# Patient Record
Sex: Male | Born: 1935
Health system: Southern US, Community
[De-identification: ages and names within clinical notes are randomized; demographics above are authoritative.]

## PROBLEM LIST (undated history)

## (undated) DIAGNOSIS — R519 Headache, unspecified: Secondary | ICD-10-CM

## (undated) DIAGNOSIS — I2510 Atherosclerotic heart disease of native coronary artery without angina pectoris: Secondary | ICD-10-CM

## (undated) DIAGNOSIS — I4891 Unspecified atrial fibrillation: Principal | ICD-10-CM

## (undated) DIAGNOSIS — M1990 Unspecified osteoarthritis, unspecified site: Secondary | ICD-10-CM

## (undated) DIAGNOSIS — N400 Enlarged prostate without lower urinary tract symptoms: Secondary | ICD-10-CM

## (undated) DIAGNOSIS — R7303 Prediabetes: Secondary | ICD-10-CM

## (undated) DIAGNOSIS — I5021 Acute systolic (congestive) heart failure: Secondary | ICD-10-CM

## (undated) DIAGNOSIS — I249 Acute ischemic heart disease, unspecified: Secondary | ICD-10-CM

## (undated) DIAGNOSIS — H409 Unspecified glaucoma: Secondary | ICD-10-CM

## (undated) DIAGNOSIS — E669 Obesity, unspecified: Secondary | ICD-10-CM

## (undated) DIAGNOSIS — K219 Gastro-esophageal reflux disease without esophagitis: Secondary | ICD-10-CM

## (undated) DIAGNOSIS — Z951 Presence of aortocoronary bypass graft: Secondary | ICD-10-CM

## (undated) DIAGNOSIS — I480 Paroxysmal atrial fibrillation: Secondary | ICD-10-CM

## (undated) DIAGNOSIS — Z9889 Other specified postprocedural states: Secondary | ICD-10-CM

## (undated) DIAGNOSIS — Z87442 Personal history of urinary calculi: Secondary | ICD-10-CM

## (undated) DIAGNOSIS — I219 Acute myocardial infarction, unspecified: Secondary | ICD-10-CM

## (undated) DIAGNOSIS — N200 Calculus of kidney: Secondary | ICD-10-CM

## (undated) DIAGNOSIS — E785 Hyperlipidemia, unspecified: Secondary | ICD-10-CM

## (undated) DIAGNOSIS — I10 Essential (primary) hypertension: Secondary | ICD-10-CM

## (undated) DIAGNOSIS — I509 Heart failure, unspecified: Secondary | ICD-10-CM

## (undated) DIAGNOSIS — I209 Angina pectoris, unspecified: Secondary | ICD-10-CM

## (undated) DIAGNOSIS — M353 Polymyalgia rheumatica: Secondary | ICD-10-CM

## (undated) DIAGNOSIS — I714 Abdominal aortic aneurysm, without rupture, unspecified: Secondary | ICD-10-CM

## (undated) DIAGNOSIS — R0600 Dyspnea, unspecified: Secondary | ICD-10-CM

## (undated) DIAGNOSIS — I872 Venous insufficiency (chronic) (peripheral): Secondary | ICD-10-CM

## (undated) DIAGNOSIS — G4730 Sleep apnea, unspecified: Secondary | ICD-10-CM

## (undated) DIAGNOSIS — I77810 Thoracic aortic ectasia: Secondary | ICD-10-CM

## (undated) DIAGNOSIS — Z8679 Personal history of other diseases of the circulatory system: Secondary | ICD-10-CM

## (undated) HISTORY — DX: Venous insufficiency (chronic) (peripheral): I87.2

## (undated) HISTORY — DX: Hyperlipidemia, unspecified: E78.5

## (undated) HISTORY — DX: Acute myocardial infarction, unspecified: I21.9

## (undated) HISTORY — DX: Abdominal aortic aneurysm, without rupture: I71.4

## (undated) HISTORY — DX: Obesity, unspecified: E66.9

## (undated) HISTORY — DX: Benign prostatic hyperplasia without lower urinary tract symptoms: N40.0

## (undated) HISTORY — PX: CARDIAC CATHETERIZATION: SHX172

## (undated) HISTORY — DX: Polymyalgia rheumatica: M35.3

## (undated) HISTORY — DX: Essential (primary) hypertension: I10

## (undated) HISTORY — DX: Angina pectoris, unspecified: I20.9

## (undated) HISTORY — PX: CATARACT EXTRACTION, BILATERAL: SHX1313

## (undated) HISTORY — DX: Heart failure, unspecified: I50.9

## (undated) HISTORY — DX: Dyspnea, unspecified: R06.00

## (undated) HISTORY — DX: Paroxysmal atrial fibrillation: I48.0

## (undated) HISTORY — DX: Atherosclerotic heart disease of native coronary artery without angina pectoris: I25.10

## (undated) HISTORY — PX: PROSTATECTOMY: SHX69

## (undated) HISTORY — DX: Abdominal aortic aneurysm, without rupture, unspecified: I71.40

---

## 2001-10-24 ENCOUNTER — Inpatient Hospital Stay (HOSPITAL_COMMUNITY): Admission: AD | Admit: 2001-10-24 | Discharge: 2001-10-28 | Payer: Self-pay | Admitting: Internal Medicine

## 2001-10-28 ENCOUNTER — Encounter (INDEPENDENT_AMBULATORY_CARE_PROVIDER_SITE_OTHER): Payer: Self-pay | Admitting: Specialist

## 2002-10-29 ENCOUNTER — Ambulatory Visit (HOSPITAL_COMMUNITY): Admission: RE | Admit: 2002-10-29 | Discharge: 2002-10-29 | Payer: Self-pay | Admitting: Cardiology

## 2004-11-06 ENCOUNTER — Ambulatory Visit: Payer: Self-pay | Admitting: Gastroenterology

## 2004-11-20 ENCOUNTER — Ambulatory Visit: Payer: Self-pay | Admitting: Gastroenterology

## 2008-12-14 DIAGNOSIS — E539 Vitamin B deficiency, unspecified: Secondary | ICD-10-CM | POA: Insufficient documentation

## 2008-12-14 DIAGNOSIS — N529 Male erectile dysfunction, unspecified: Secondary | ICD-10-CM | POA: Insufficient documentation

## 2008-12-14 DIAGNOSIS — R7301 Impaired fasting glucose: Secondary | ICD-10-CM | POA: Insufficient documentation

## 2009-01-26 ENCOUNTER — Encounter: Admission: RE | Admit: 2009-01-26 | Discharge: 2009-01-26 | Payer: Self-pay | Admitting: Cardiology

## 2009-01-27 ENCOUNTER — Inpatient Hospital Stay (HOSPITAL_BASED_OUTPATIENT_CLINIC_OR_DEPARTMENT_OTHER): Admission: RE | Admit: 2009-01-27 | Discharge: 2009-01-27 | Payer: Self-pay | Admitting: Cardiology

## 2009-07-02 ENCOUNTER — Emergency Department (HOSPITAL_COMMUNITY): Admission: EM | Admit: 2009-07-02 | Discharge: 2009-07-02 | Payer: Self-pay | Admitting: Emergency Medicine

## 2009-07-05 DIAGNOSIS — N200 Calculus of kidney: Secondary | ICD-10-CM | POA: Insufficient documentation

## 2009-07-05 DIAGNOSIS — K5900 Constipation, unspecified: Secondary | ICD-10-CM | POA: Insufficient documentation

## 2010-03-19 LAB — HEPATIC FUNCTION PANEL
Alkaline Phosphatase: 36 U/L — ABNORMAL LOW (ref 39–117)
Total Bilirubin: 0.8 mg/dL (ref 0.3–1.2)
Total Protein: 6.4 g/dL (ref 6.0–8.3)

## 2010-03-19 LAB — POCT I-STAT, CHEM 8
Calcium, Ion: 1.08 mmol/L — ABNORMAL LOW (ref 1.12–1.32)
HCT: 40 % (ref 39.0–52.0)
Hemoglobin: 13.6 g/dL (ref 13.0–17.0)
TCO2: 21 mmol/L (ref 0–100)

## 2010-03-19 LAB — URINALYSIS, ROUTINE W REFLEX MICROSCOPIC
Bilirubin Urine: NEGATIVE
pH: 6 (ref 5.0–8.0)

## 2010-03-19 LAB — URINE MICROSCOPIC-ADD ON

## 2010-04-12 ENCOUNTER — Inpatient Hospital Stay (HOSPITAL_COMMUNITY)
Admission: EM | Admit: 2010-04-12 | Discharge: 2010-04-14 | DRG: 287 | Disposition: A | Discharge status: Home / Self Care | Payer: Medicare Other | Attending: Cardiology | Admitting: Cardiology

## 2010-04-12 ENCOUNTER — Emergency Department (HOSPITAL_COMMUNITY): Payer: Medicare Other

## 2010-04-12 DIAGNOSIS — E785 Hyperlipidemia, unspecified: Secondary | ICD-10-CM | POA: Diagnosis present

## 2010-04-12 DIAGNOSIS — I209 Angina pectoris, unspecified: Secondary | ICD-10-CM | POA: Diagnosis present

## 2010-04-12 DIAGNOSIS — Z7902 Long term (current) use of antithrombotics/antiplatelets: Secondary | ICD-10-CM

## 2010-04-12 DIAGNOSIS — M109 Gout, unspecified: Secondary | ICD-10-CM | POA: Diagnosis present

## 2010-04-12 DIAGNOSIS — Z87891 Personal history of nicotine dependence: Secondary | ICD-10-CM

## 2010-04-12 DIAGNOSIS — N138 Other obstructive and reflux uropathy: Secondary | ICD-10-CM | POA: Diagnosis present

## 2010-04-12 DIAGNOSIS — I10 Essential (primary) hypertension: Secondary | ICD-10-CM | POA: Diagnosis present

## 2010-04-12 DIAGNOSIS — I498 Other specified cardiac arrhythmias: Secondary | ICD-10-CM | POA: Diagnosis present

## 2010-04-12 DIAGNOSIS — E669 Obesity, unspecified: Secondary | ICD-10-CM | POA: Diagnosis present

## 2010-04-12 DIAGNOSIS — I2510 Atherosclerotic heart disease of native coronary artery without angina pectoris: Principal | ICD-10-CM | POA: Diagnosis present

## 2010-04-12 DIAGNOSIS — N401 Enlarged prostate with lower urinary tract symptoms: Secondary | ICD-10-CM | POA: Diagnosis present

## 2010-04-12 DIAGNOSIS — F068 Other specified mental disorders due to known physiological condition: Secondary | ICD-10-CM | POA: Diagnosis present

## 2010-04-12 LAB — URINALYSIS, ROUTINE W REFLEX MICROSCOPIC
Bilirubin Urine: NEGATIVE
Glucose, UA: NEGATIVE mg/dL
Hgb urine dipstick: NEGATIVE
Ketones, ur: NEGATIVE mg/dL
Nitrite: NEGATIVE
Protein, ur: NEGATIVE mg/dL
Specific Gravity, Urine: 1.016 (ref 1.005–1.030)
Urobilinogen, UA: 0.2 mg/dL (ref 0.0–1.0)
pH: 6.5 (ref 5.0–8.0)

## 2010-04-12 LAB — COMPREHENSIVE METABOLIC PANEL
Albumin: 3.5 g/dL (ref 3.5–5.2)
BUN: 16 mg/dL (ref 6–23)
Creatinine, Ser: 1.03 mg/dL (ref 0.4–1.5)
GFR calc Af Amer: >60 mL/min (ref >60)
Potassium: 3.9 mEq/L (ref 3.5–5.1)
Sodium: 134 mEq/L — ABNORMAL LOW (ref 135–145)
Total Bilirubin: 1.1 mg/dL (ref 0.3–1.2)
Total Protein: 6 g/dL (ref 6.0–8.3)

## 2010-04-12 LAB — CBC
HCT: 38.2 % — ABNORMAL LOW (ref 39.0–52.0)
Hemoglobin: 12.6 g/dL — ABNORMAL LOW (ref 13.0–17.0)
MCH: 30.3 pg (ref 26.0–34.0)
MCHC: 33 g/dL (ref 30.0–36.0)
MCV: 91.8 fL (ref 78.0–100.0)
Platelets: 138 10*3/uL — ABNORMAL LOW (ref 150–400)
RBC: 4.16 MIL/uL — ABNORMAL LOW (ref 4.22–5.81)
RDW: 14 % (ref 11.5–15.5)
WBC: 6.1 10*3/uL (ref 4.0–10.5)

## 2010-04-12 LAB — BASIC METABOLIC PANEL
BUN: 18 mg/dL (ref 6–23)
Chloride: 103 mEq/L (ref 96–112)
GFR calc Af Amer: >60 mL/min (ref >60)
GFR calc non Af Amer: >60 mL/min (ref >60)
Glucose, Bld: 106 mg/dL — ABNORMAL HIGH (ref 70–99)
Potassium: 4.2 mEq/L (ref 3.5–5.1)

## 2010-04-12 LAB — DIFFERENTIAL
Basophils Absolute: 0.1 K/uL (ref 0.0–0.1)
Basophils Relative: 1 % (ref 0–1)
Eosinophils Absolute: 0.4 K/uL (ref 0.0–0.7)
Eosinophils Relative: 7 % — ABNORMAL HIGH (ref 0–5)
Lymphocytes Relative: 18 % (ref 12–46)
Lymphs Abs: 1.1 K/uL (ref 0.7–4.0)
Monocytes Absolute: 0.6 10*3/uL (ref 0.1–1.0)
Monocytes Relative: 10 % (ref 3–12)
Neutro Abs: 3.9 10*3/uL (ref 1.7–7.7)
Neutrophils Relative %: 64 % (ref 43–77)

## 2010-04-12 LAB — BASIC METABOLIC PANEL WITH GFR
CO2: 24 meq/L (ref 19–32)
Calcium: 8.9 mg/dL (ref 8.4–10.5)
Creatinine, Ser: 1.11 mg/dL (ref 0.4–1.5)
Sodium: 135 meq/L (ref 135–145)

## 2010-04-12 LAB — PROTIME-INR
INR: 0.99 (ref 0.00–1.49)
Prothrombin Time: 13.3 s (ref 11.6–15.2)

## 2010-04-12 LAB — APTT: aPTT: 26 seconds (ref 24–37)

## 2010-04-12 LAB — CK TOTAL AND CKMB (NOT AT ARMC)
Relative Index: INVALID (ref 0.0–2.5)
Total CK: 89 U/L (ref 7–232)

## 2010-04-12 LAB — CARDIAC PANEL(CRET KIN+CKTOT+MB+TROPI)
CK, MB: 2.1 ng/mL (ref 0.3–4.0)
Troponin I: <0.01 ng/mL (ref 0.00–0.06)

## 2010-04-13 LAB — CBC
HCT: 33.8 % — ABNORMAL LOW (ref 39.0–52.0)
MCH: 30.5 pg (ref 26.0–34.0)
MCHC: 33.4 g/dL (ref 30.0–36.0)
MCV: 91.1 fL (ref 78.0–100.0)
RDW: 14.1 % (ref 11.5–15.5)

## 2010-04-13 LAB — CARDIAC PANEL(CRET KIN+CKTOT+MB+TROPI): CK, MB: 1.5 ng/mL (ref 0.3–4.0)

## 2010-04-13 LAB — HEPARIN LEVEL (UNFRACTIONATED): Heparin Unfractionated: 0.3 IU/mL (ref 0.30–0.70)

## 2010-04-14 LAB — CBC
HCT: 36.3 % — ABNORMAL LOW (ref 39.0–52.0)
MCV: 90.5 fL (ref 78.0–100.0)
Platelets: 123 10*3/uL — ABNORMAL LOW (ref 150–400)
RBC: 4.01 MIL/uL — ABNORMAL LOW (ref 4.22–5.81)
WBC: 5.4 10*3/uL (ref 4.0–10.5)

## 2010-04-14 LAB — BASIC METABOLIC PANEL
BUN: 9 mg/dL (ref 6–23)
Chloride: 105 mEq/L (ref 96–112)
Potassium: 3.5 mEq/L (ref 3.5–5.1)
Sodium: 134 mEq/L — ABNORMAL LOW (ref 135–145)

## 2010-04-16 ENCOUNTER — Emergency Department (HOSPITAL_COMMUNITY)
Admission: EM | Admit: 2010-04-16 | Discharge: 2010-04-16 | Disposition: A | Discharge status: Home / Self Care | Payer: Medicare Other | Attending: Emergency Medicine | Admitting: Emergency Medicine

## 2010-04-16 DIAGNOSIS — E7800 Pure hypercholesterolemia, unspecified: Secondary | ICD-10-CM | POA: Insufficient documentation

## 2010-04-16 DIAGNOSIS — R339 Retention of urine, unspecified: Secondary | ICD-10-CM | POA: Insufficient documentation

## 2010-04-16 DIAGNOSIS — N400 Enlarged prostate without lower urinary tract symptoms: Secondary | ICD-10-CM | POA: Insufficient documentation

## 2010-04-16 LAB — URINALYSIS, ROUTINE W REFLEX MICROSCOPIC
Glucose, UA: NEGATIVE mg/dL
Ketones, ur: NEGATIVE mg/dL
Specific Gravity, Urine: 1.023 (ref 1.005–1.030)
pH: 6 (ref 5.0–8.0)

## 2010-04-17 LAB — URINE CULTURE
Colony Count: NO GROWTH
Culture  Setup Time: 201204151158

## 2010-04-18 DIAGNOSIS — R339 Retention of urine, unspecified: Secondary | ICD-10-CM | POA: Insufficient documentation

## 2010-04-19 NOTE — H&P (Signed)
NAME:  [name] [name], [name] [name] NO.:  [id number]  MEDICAL RECORD NO.:  [id number]           PATIENT TYPE:  E  LOCATION:  [facility]                         FACILITY:  [facility]  PHYSICIAN:  Ezzard Standing, M.D.DATE OF BIRTH:  [birth year]  DATE OF ADMISSION:  04/12/2010 DATE OF DISCHARGE:                             HISTORY & PHYSICAL   REASON FOR ADMISSION:  Chest discomfort.  HISTORY:  The patient is an 77 year old male who has a history of coronary artery disease with angina.  He has a history of hypertension and hyperlipidemia.  He had an occluded right coronary artery with moderately severe disease involving the LAD and left main coronary artery catheterization 1 year ago.  He has been treated medically since that time.  He has typical angina pectoris that has precipitated by cold air or with moderate exertion.  He finds if he eats a heavy meal and then exercises, he will typically get an angina.  He has not had rest symptoms previously.  He oftentimes was having angina precipitated by sexual activity.  He had a fairly heavy work out in the yard yesterday and had some vague angina during this period.  He had eaten a meal and had angina on walking to a lecture last evening.  He had the onset of midsternal chest discomfort described as heaviness and tightness resembled in his previous angina that occurred last evening and he took a nitroglycerin. The discomfort occurred on and off throughout the evening and he was getting ready to go to the gymnasium this morning and because he was having  some vague chest discomfort was brought to the emergency room after recommendation by his primary physician's office.  He has been started on nitroglycerin and is currently pain-free.  He has a previous history of a headache for the past 5 days and had a negative CT scan. He denies PND, orthopnea, edema, syncope or claudication.  PAST MEDICAL HISTORY:  Remarkable for: 1.  Hypertension. 2. Hyperlipidemia. 3. Gout. 4. Obesity. 5. BPH.  PREVIOUS SURGERY:  None.  ALLERGIES:  BEESTINGS.  CURRENT MEDICATIONS: 1. Allopurinol 300 mg daily. 2. Finasteride 5 mg daily. 3. Aspirin 81 mg daily. 4. Atenolol 50 mg daily. 5. Benazepril 10 mg daily. 6. Colchicine 0.6 mg p.r.n. 7. Crestor 20 mg daily. 8. Nitroglycerin p.r.n. 9. Vitamin B12 injections and capsules. 10.Fish oil. 11.Vitamin D. 12.Zetia 10 mg daily. 13.Amlodipine 5 mg daily.  SOCIAL HISTORY:  He is retired from [employer].  He is a native of [place].  He used to smoke but quit prior to 1980.  Drinks alcohol socially.  He is widowed and remarried.  He exercises on a regular basis.  FAMILY HISTORY:  Recorded in old records is reviewed and unchanged.  REVIEW OF SYSTEMS:  He has been obese for several years.  His weight has been stable.  He has no skin problems.  He has had bilateral cataract extraction.  He has intermittent sinusitis and upper respiratory allergies.  He denies dyspnea, cough, wheezing or hemoptysis.  He has no palpitations.  He has a history of hesitancy erectile dysfunction and  mild urinary incontinence.  He denies arthritis, but does have some mild edema and venous insufficiency.  He has evidently been diagnosed with mild dementia recently by his primary physician and he has been placed on some unknown medication.  Other than as noted above, the remainder of review of systems is unremarkable.  PHYSICAL EXAMINATION:  GENERAL:  He is a pleasant obese male who is currently in no acute distress. VITAL SIGNS:  His blood pressure is 130/70, pulse is currently 45 and regular. SKIN:  Warm and dry. ENT:  EOMI.  PERRLA.  C and S:  Clear.  Balding male hair pattern. NECK:  Supple.  No masses, no JVD, thyromegaly or bruits. LUNGS:  Clear to A and P.  Normal symmetry. CARDIAC:  Regular slow rhythm, normal S1, S2, there is one 2/6 systolic murmur at the aortic area.  There is no  diastolic murmur. ABDOMEN:  Soft, nontender without masses, hepatosplenomegaly or aneurysm.  Femoral pulses are 2+, no bruits noted.  Distal pulses are 2+.  He has 1+ edema with mild venous insufficiency changes noted. NEUROLOGIC:  Alert, oriented x3.  Sensory and motor are intact.  LABS AND IMAGING:  EKG shows sinus bradycardia with a normal EKG, otherwise.  His glucose is slightly elevated but BUN and creatinine were normal.  Chemistry panel was normal.  Her initial point-of-care enzymes was normal.  PT and PTT were normal.  Creatinine is 1.11.  CT scan of his head is normal.  Chest x-ray shows borderline cardiomegaly.  IMPRESSION: 1. Unstable angina pectoris with prolonged pain occurring at rest. 2. Coronary artery disease with mild-to-moderate left main disease,     moderate left anterior descending (coronary) artery and circumflex     disease, and occluded right coronary artery with normal left     ventricular function. 3. Systolic heart murmur. 4. Hyperlipidemia under treatment. 5. Hypertension. 6. Obesity. 7. Early dementia.  RECOMMENDATIONS:  The patient will be placed on IV heparin, continued on IV nitroglycerin and continued on his medications.  PLAN:  Cardiac catheterization to assess for progression of disease.  He appears to have had significant angina and is on good medical therapy and may require a bypass surgery if significant progression is noted. Cardiac catheterization procedure was discussed with the patient fully including the risks of myocardial infarction, death, stroke or bleeding. He understands and is willing to proceed.     Ezzard Standing, M.D.     WST/MEDQ  D:  04/12/2010  T:  04/13/2010  Job:  [id number]  cc:   Elta Guadeloupe A. Perini, M.D.  Electronically Signed by Viona Gilmore. Wynonia Lawman M.D. on 04/19/2010 02:08:46 PM

## 2010-04-19 NOTE — Cardiovascular Report (Signed)
  NAME:  [name] [name], [name] [name] NO.:  [id number]  MEDICAL RECORD NO.:  [id number]           PATIENT TYPE:  LOCATION:                                 FACILITY:  PHYSICIAN:  Ezzard Standing, M.D.DATE OF BIRTH:  [birth year]  DATE OF PROCEDURE:  04/13/2010                            CARDIAC CATHETERIZATION   HISTORY:  This is an 77 year old male who has a prior history of coronary artery disease treated medically presented with prolonged chest discomfort with negative cardiac enzymes and EKG occurring at rest.  PROCEDURE:  Left heart catheterization with coronary angiograms and left ventriculogram.  COMMENTS ABOUT PROCEDURE:  The patient was brought to the catheterization laboratory and was prepped and draped in the usual manner.  After Xylocaine anesthesia, a 5-French sheath was placed in the right femoral artery after a single anterior needle wall stick. Angiograms were made using 5-French catheters.  A 5 left Judkins catheter was used to select the left coronary artery.  A 30 mL ventriculogram was performed.  He was returned to the holding area in stable condition for sheath removal.  HEMODYNAMIC DATA:  Aorta postcontrast 121/12, LV postcontrast 121/12-20, aorta 121/57.  ANGIOGRAPHIC DATA:  Left ventriculogram:  Performed in the 30-degree RAO projection.  The aortic valve is normal.  The mitral valve is normal. Left ventricle is normal in size with normal wall motion.  Estimated ejection fraction is 60%.  Coronary arteries arise and distribute normally.  There is significant coronary calcification present.  Left main coronary artery is calcified with a mild ostial stenosis.  There is a distal 40% stenosis in the left main coronary artery.  The left anterior descending is heavily calcified.  There is moderate segmental disease estimated at 40-50% along the proximal portion of the vessel. Diagonal branch has an ostial 60% stenosis.  Circumflex coronary artery first  marginal branch has a hazy appearing portion in the midportion of the vessel.  The vessel has only 30% stenosis angiographically but does have some haziness noted.  Continuation branch has a 40% stenosis prior to a second marginal branch.  The right coronary artery is subtotally occluded proximally and fills by left-to-right collateral filling as well as right-to-right collateral filling.  IMPRESSION: 1. Normal left ventricular function. 2. Moderate diffuse three-vessel coronary artery disease with     moderately severe stenosis in the left anterior descending     proximally, hazy stenosis present involving the first marginal     branch, but not severely obstructed and occluded right coronary     artery.  RECOMMENDATIONS:  We will continue medical therapy at this time.  He does have some underlying dementia and prior to this had stable angina. May consider the addition of Plavix.  Prior to this time, he did have significant angina but appeared to be comfortable with the symptoms.     Ezzard Standing, M.D.     WST/MEDQ  D:  04/13/2010  T:  04/13/2010  Job:  [id number]  cc:   Elta Guadeloupe A. Perini, M.D.  Electronically Signed by Viona Gilmore. Wynonia Lawman M.D. on 04/19/2010 02:08:59 PM

## 2010-04-19 NOTE — Discharge Summary (Signed)
NAME:  [name] [name], [name] [name]                 ACCOUNT NO.:  [id number]  MEDICAL RECORD NO.:  [id number]           PATIENT TYPE:  I  LOCATION:  [id number]                         FACILITY:  [facility]  PHYSICIAN:  Ezzard Standing, M.D.DATE OF BIRTH:  05-14-1926  DATE OF ADMISSION:  04/12/2010 DATE OF DISCHARGE:  04/14/2010                              DISCHARGE SUMMARY   FINAL DIAGNOSES: 1. Chest discomfort consistent with prolonged angina. 2. Coronary artery disease with occluded right coronary artery,     moderate disease involving the LAD, left main coronary artery and     circumflex treated medically. 3. Dementia. 4. Bradycardia may be due to dementia medicines. 5. Hyperlipidemia under treatment. 6. Hypertension, currently under control. 7. Obesity. 8. Benign prostatic hypertrophy with urinary retention.  PROCEDURES:  Cardiac catheterization.  HISTORY:  The patient is an 77 year old male who has a previous history of coronary artery disease with stable angina.  He has a known occluded right coronary artery.  He presented to the emergency room with a prolonged episode of chest discomfort that occurred at rest, not associated with EKG changes or with enzyme elevation.  This is the first episode he had at rest.  It is notable that he has had some early dementia that has been evaluated recently.  He was painfree when I saw him on intravenous nitroglycerin.  Please see the previously dictated history and physical for remainder of details.  HOSPITAL COURSE:  The patient was admitted and had a normal CBC. Chemistry panel was normal.  Initial cardiac enzymes were normal and subsequent troponins and MB were normal.  He had no recurrence of pain, but because of his prolonged episode of pain, he underwent cardiac catheterization.  He was found to have moderate left main disease, which was unchanged from a previous catheterization 14 months earlier, moderate diffuse disease involving the LAD, occluded  right coronary artery with collaterals, and moderate circumflex disease.  The results were discussed with the patient and his wife.  He did develop some urinary retention following the catheterization that required in and out catheterization.  He did note he was having increasing symptoms of prostatism and he would need to see a urologist.  He was somewhat bradycardic and his atenolol was held and it was felt that the Melamine maybe cause for the bradycardia that he was on for dementia.  He was ambulatory in the hall without further chest discomfort.  I had the cardiac surgeons look at his x-rays and the opinion was that we should continue medical therapy as he appeared to have significant angina. There was a question about whether the right coronary artery would able to be successfully bypassed if he had surgery in light of angina and his age as well as dementia, we elected to continue to treat him medically. He is discharged at this time in improved condition.  He is due to have dental work done and I asked them to contact the dentist to find out if he could take Plavix or not while he was having his dental work.  Plavix was added to his regimen as well as isosorbide  mononitrate 30 mg daily and he was set in a new prescription for nitroglycerin.  He is to follow up for appointment with me in 1 week.  Please see the medicine reconciliation sheet for the remainder of the medications.     Ezzard Standing, M.D.     WST/MEDQ  D:  04/14/2010  T:  04/14/2010  Job:  [id number]  cc:   Elta Guadeloupe A. Perini, M.D.  Electronically Signed by Viona Gilmore. Wynonia Lawman M.D. on 04/19/2010 02:08:50 PM

## 2010-04-23 ENCOUNTER — Emergency Department (HOSPITAL_COMMUNITY)
Admission: EM | Admit: 2010-04-23 | Discharge: 2010-04-23 | Disposition: A | Discharge status: Home / Self Care | Payer: Medicare Other | Attending: Emergency Medicine | Admitting: Emergency Medicine

## 2010-04-23 ENCOUNTER — Emergency Department (HOSPITAL_COMMUNITY): Payer: Medicare Other

## 2010-04-23 DIAGNOSIS — R5383 Other fatigue: Secondary | ICD-10-CM | POA: Insufficient documentation

## 2010-04-23 DIAGNOSIS — I209 Angina pectoris, unspecified: Secondary | ICD-10-CM | POA: Insufficient documentation

## 2010-04-23 DIAGNOSIS — E7800 Pure hypercholesterolemia, unspecified: Secondary | ICD-10-CM | POA: Insufficient documentation

## 2010-04-23 DIAGNOSIS — R5381 Other malaise: Secondary | ICD-10-CM | POA: Insufficient documentation

## 2010-04-23 DIAGNOSIS — Z79899 Other long term (current) drug therapy: Secondary | ICD-10-CM | POA: Insufficient documentation

## 2010-04-23 DIAGNOSIS — R339 Retention of urine, unspecified: Secondary | ICD-10-CM | POA: Insufficient documentation

## 2010-04-23 LAB — CBC
Hemoglobin: 12.9 g/dL — ABNORMAL LOW (ref 13.0–17.0)
MCH: 30.4 pg (ref 26.0–34.0)
MCV: 89.6 fL (ref 78.0–100.0)
Platelets: 215 10*3/uL (ref 150–400)
RBC: 4.24 MIL/uL (ref 4.22–5.81)
WBC: 4.5 10*3/uL (ref 4.0–10.5)

## 2010-04-23 LAB — BASIC METABOLIC PANEL
BUN: 18 mg/dL (ref 6–23)
CO2: 22 mEq/L (ref 19–32)
Chloride: 104 mEq/L (ref 96–112)
Creatinine, Ser: 1.32 mg/dL (ref 0.4–1.5)
Potassium: 4.2 mEq/L (ref 3.5–5.1)

## 2010-04-23 LAB — URINALYSIS, ROUTINE W REFLEX MICROSCOPIC
Bilirubin Urine: NEGATIVE
Glucose, UA: NEGATIVE mg/dL
Ketones, ur: NEGATIVE mg/dL
Protein, ur: NEGATIVE mg/dL
pH: 7 (ref 5.0–8.0)

## 2010-04-23 LAB — DIFFERENTIAL
Lymphocytes Relative: 19 % (ref 12–46)
Lymphs Abs: 0.9 10*3/uL (ref 0.7–4.0)
Monocytes Relative: 9 % (ref 3–12)
Neutro Abs: 3.1 10*3/uL (ref 1.7–7.7)
Neutrophils Relative %: 69 % (ref 43–77)

## 2010-04-23 LAB — HEPATIC FUNCTION PANEL
ALT: 15 U/L (ref 0–53)
AST: 15 U/L (ref 0–37)
Alkaline Phosphatase: 38 U/L — ABNORMAL LOW (ref 39–117)
Total Protein: 6.5 g/dL (ref 6.0–8.3)

## 2010-04-23 LAB — URINE MICROSCOPIC-ADD ON

## 2010-04-23 LAB — MONONUCLEOSIS SCREEN: Mono Screen: NEGATIVE

## 2010-04-24 LAB — URINE CULTURE

## 2010-04-25 LAB — ROCKY MTN SPOTTED FVR AB, IGM-BLOOD: RMSF IgM: 0.1 IV (ref 0.00–0.89)

## 2010-04-29 LAB — CULTURE, BLOOD (ROUTINE X 2): Culture  Setup Time: 201204221717

## 2010-05-19 NOTE — Cardiovascular Report (Signed)
NAME:  CORGAN, BLIGH NO.:  [id number]   MEDICAL RECORD NO.:  [id number]                   PATIENT TYPE:  OIB   LOCATION:  2899                                 FACILITY:  [facility]   PHYSICIAN:  W. Doristine Church., M.D.         DATE OF BIRTH:  1926/10/13   DATE OF PROCEDURE:  10/29/2002  DATE OF DISCHARGE:                              CARDIAC CATHETERIZATION   HISTORY:  A 77 year old male with history of known coronary artery disease  who presented with some worsening chest discomfort recently.  Study is done  to evaluate for progression of disease.   PROCEDURE:  Cardiac catheterization with coronary angiogram and left  ventriculogram.   COMMENTS ABOUT PROCEDURE:  The patient tolerated the procedure well without  complications.  Following the procedure, good hemostasis and peripheral  pulses were present.   HEMODYNAMIC DATA:  1. Aorta postcontrast:  100/70.  2. LV postcontrast:  100/7-14.   ANGIOGRAPHIC DATA:  Left ventriculogram:  Performed in the 30 degree RAO  projection.  The aortic valve was normal.  The mitral valve appears normal.  The left ventricle appears normal in size.  Estimated ejection fraction was  70%.   Coronary arteries:  Arise and distribute normally.  There is dense  calcification noted involving the left main and proximal left coronary  system.  The right coronary artery is also calcified.  Left main coronary  artery is calcified.  There is a 30-40% ostial narrowing noted with some  calcification noted in the mid portion.  The left anterior descending is  heavily calcified with a segmental 40% stenosis in the proximal and mid  portions.  Circumflex coronary artery is a large vessel supplying two  marginal branches.  There is calcification and approximately 40% narrowing  in the mid portion of the large marginal branch.  The right coronary artery  is occluded proximally and there is a long segment occlusion with  reconstitution of the distal vessel through right-to-right collaterals  through the conus branch as well as left-to-right collaterals.   IMPRESSION:  1. Coronary artery disease, mild-to-moderate ostial left main disease,     diffuse calcific disease involving the left anterior descending,     circumflex and total occlusion of the right coronary artery.  2. Normal left ventricular function.    RECOMMENDATIONS:  Continue medical therapy and most likely at this time  stress Cardiolite to assess exercise capacity at this time.                                                   Kerry Hough., M.D.    WST/MEDQ  D:  10/29/2002  T:  10/29/2002  Job:  [id number]   cc:   Elta Guadeloupe A. Perini, M.D.  [id number]  [place]  [place] [address]  Fax: [phone]

## 2010-05-19 NOTE — Discharge Summary (Signed)
NAME:  [name] [name], MOA NO.:  [id number]   MEDICAL RECORD NO.:  [id number]                   PATIENT TYPE:  INP   LOCATION:  [id number]                                 FACILITY:  [facility]   PHYSICIAN:  Mark A. Perini, M.D.                DATE OF BIRTH:  October 31, 1926   DATE OF ADMISSION:  10/24/2001  DATE OF DISCHARGE:  10/28/2001                                 DISCHARGE SUMMARY   DISCHARGE DIAGNOSES:  1. Acute lower gastrointestinal bleed with hematochezia and a 3.5 g drop in     hemoglobin due to a bleeding colon polyp while on aspirin and Plavix     study drug.  2. Diverticulosis.  3. Atherosclerotic coronary artery disease, stable at this time.  4. Hypertension.  5. Hypercholesterolemia.  6. History of gout.  7. History of prostatitis.  8. History of bee sting allergy.   CONSULTATIONS:  Gastroenterology, Dr. Norberto Sorenson T. Fuller Plan initially and then  further procedure per Dr. Sandy Salaam. Deatra Ina.   PROCEDURES:  1. Colonoscopy done on October 25, 2001 showing pan-diverticulosis, also     showed a large sessile polyp with adherent clot at 10 cm, which was     injected with 10 cc of epinephrine; bleeding was controlled.  2. On October 28, 2001, flexible sigmoidoscopy with polypectomy.   DISCHARGE MEDICATIONS:  These are to be the same as admission with the  exception that the patient will discontinue his Plavix study medicine and  this will likely signify the end of the study for him.  He will resume  aspirin on Tuesday, November 04, 2001, coated baby aspirin.  Other  medications will remain the same including:  1. Allopurinol 300 mg once daily.  2. Atenolol 50 mg daily.  3. Lipitor 20 mg each evening.  4. Lotensin 10 mg daily.  5. He will discontinue vitamin E.  6. He is to remain on venom injections -- two shots every eight weeks.  7. Nitroglycerin as needed.  8. Colchicine as needed.  9. EpiPen as needed for bee sting allergy.   HISTORY OF PRESENT  ILLNESS:  The patient is a pleasant 77 year old gentleman  with a history of atherosclerotic coronary artery disease who has been  treated with medical therapy.  He has been on a study for the last several  months which is comparing aspirin to aspirin and Plavix therapy in terms of  coronary protection.  He had been in his usual state of health and feeling  quite well, until the day of admission, at which time he presented with  several episodes of hematochezia.  Vital signs were stable in the clinic but  he did have bright red blood in the rectal vault.  He was therefore admitted  for further evaluation and treatment.   HOSPITAL COURSE:  The patient was admitted to a regular bed.  He had serial  hemoglobin,  a monitor which showed a drop of hemoglobin of 10.9, which was  approximately a 3.5 g drop from baseline for him.  He did have one further  episode of hematochezia in the hospital.  Aspirin and Plavix study drug were  held.  Gastroenterology was consulted and we appreciate their assistance in  this case.  The following day, he subsequently underwent colonoscopy, with  results as noted above.  It was felt that his bleeding source was this large  sessile colon polyp.  He was allowed to remain off aspirin and Plavix for  several more days and subsequently returned to the procedure room for a  polypectomy with a flexible sigmoidoscopy on October 28, 2001.  The patient  remained hemodynamically stable during his entire hospital course.  He had  no infectious complications.  He had no chest pain or other difficulties.  Vital signs remained stable during the whole hospital course.  On October 28, 2001, he was deemed stable for discharge home.   DISCHARGE PHYSICAL EXAMINATION:  The patient was afebrile with stable vital  signs.  He was in no acute distress.  Lungs were clear to auscultation  bilaterally.  There was no pallor.  Heart was regular rate and rhythm with  no definitive murmur  auscultated.  He had no peripheral edema and was  neurologically intact.   NOTABLE LABORATORY DATA:  On October 28, 2001, white count was 3.8,  hemoglobin 11.6, hematocrit 34.3, platelet count 184,000.  Coagulation  studies were normal on October 26, 2001.  On admission, CMET was within  normal limits with the exception of a mildly elevated glucose of 148.  Liver  tests were normal.  The patient has O-positive blood type.   DISCHARGE INSTRUCTIONS:  The patient is to be up as tolerated.  He is to  follow a low-fat, low-cholesterol diet.  He is to call if he has any  recurrent bleeding problems.  He is to follow up in two to three weeks with  Dr. Elta Guadeloupe A. Perini in the office.  He will follow up with gastroenterology  as needed.  In addition to his other medications, he will also be taking  iron supplement, with a prescription given.  His blood counts will be  followed serially to determine the duration of iron treatment.                                               Mark A. Joylene Draft, M.D.    MAP/MEDQ  D:  10/30/2001  T:  10/30/2001  Job:  [id number]   cc:   Sandy Salaam. Deatra Ina, M.D. [facility]   W. Doristine Church., M.D.  [id number], [address] [place]  [place] [address]  Fax: [phone]
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
## 2010-05-19 NOTE — Op Note (Signed)
NAME:  [name] [name], [name] [name] NO.:  [id number]   MEDICAL RECORD NO.:  [id number]                   PATIENT TYPE:  INP   LOCATION:  [id number]                                 FACILITY:  [facility]   PHYSICIAN:  Nelwyn Salisbury, M.D.               DATE OF BIRTH:  08-26-1926   DATE OF PROCEDURE:  10/25/2001  DATE OF DISCHARGE:                                 OPERATIVE REPORT   PROCEDURE PERFORMED:  Colonoscopy with injection of epinephrine into the  base of a large polyp.   ENDOSCOPIST:  Nelwyn Salisbury, M.D.   INSTRUMENT USED:  Olympus video colonoscope.   INDICATIONS FOR PROCEDURE:  Painless  hematochezia in a 77 year old white  male, rule out colonic polyps, masses, diverticulosis, AVMs, etc.   PREPROCEDURE PREPARATION:  Informed consent was secured from the patient.  The patient was fasted for four hours prior to  the procedure and was  prepped with a bottle of GoLYTELY the night prior to the procedure. His CBC  was checked prior to  the procedure.   PREPROCEDURE PHYSICAL:  The patient had stable vital signs. Neck was supple.  Chest clear to auscultation. S1, S2 regular. Abdomen soft with normal bowel  sounds.   DESCRIPTION OF PROCEDURE:  The patient was placed in the left lateral  decubitus position and sedated with 80 mg of Demerol and 5 mg of Versed  intravenously. Once the patient was adequately sedated and maintained on low  flow oxygen  and continuous cardiac monitoring, the Olympus video  colonoscope was advanced into the rectum to the cecum and terminal ileum  with difficulty.   There was some fresh blood seen in the rectosigmoid area, but the colon  beyond that showed no evidence of fresh blood. There was evidence of  pandiverticulosis with more seen in the left colon. The appendicular orifice  and the ileocecal valve were clearly visualized and photographed. They  appeared normal. The terminal ileum appeared normal as well.   A large sessile polyp  was seen  at 10 cm. It was bleeding. This had adherent  clot over the polyp. This polyp was not snared, as the patient had been on  Plavix less than  two days ago. Then 10 cc of epinephrine were injected into  the base of the polyp and hemostasis was achieved. Multiple washes were  done. There was no evidence of bleeding once the epinephrine had been  injected into the polyp.  The patient tolerated the procedure well without  complication.   IMPRESSION:  1. Small nonbleeding internal hemorrhoid seen on retroflexion.  2. Pandiverticulosis with more seen in the left colon.  3. A large sessile bleeding polyp with an adherent clot seen at 10 cm, 10 cc     of epinephrine injection to the base of the polyp. The polyp  not snared     because of the patient's recent  use of Plavix.  4. Normal appearing terminal ileum.   RECOMMENDATIONS:  1. Continue a clear liquid diet.  2. Continue serial CBCs.  3. Repeat endoscopy in the next two to three days with polypectomy or     emergently if the patient continues to bleed.                                                Nelwyn Salisbury, M.D.    JNM/MEDQ  D:  10/25/2001  T:  10/27/2001  Job:  [id number]   cc:   Elta Guadeloupe A. Perini, M.D.   Pricilla Riffle. Dagoberto Ligas., M.D. [facility]

## 2010-05-23 LAB — URINALYSIS, MICROSCOPIC ONLY
Bilirubin Urine: NEGATIVE
Glucose, UA: NEGATIVE mg/dL
Hgb urine dipstick: NEGATIVE
Ketones, ur: NEGATIVE mg/dL
Leukocytes, UA: NEGATIVE
Nitrite: NEGATIVE
Protein, ur: NEGATIVE mg/dL
Specific Gravity, Urine: 1.021 (ref 1.005–1.030)
Urobilinogen, UA: 0.2 mg/dL (ref 0.0–1.0)
pH: 6.5 (ref 5.0–8.0)

## 2010-05-24 LAB — URINE CULTURE
Colony Count: NO GROWTH
Culture  Setup Time: 201205221302
Culture: NO GROWTH
Special Requests: POSITIVE

## 2010-05-26 ENCOUNTER — Ambulatory Visit (HOSPITAL_BASED_OUTPATIENT_CLINIC_OR_DEPARTMENT_OTHER)
Admission: RE | Admit: 2010-05-26 | Discharge: 2010-05-27 | Disposition: A | Discharge status: Home / Self Care | Payer: Medicare Other | Source: Ambulatory Visit | Attending: Urology | Admitting: Urology

## 2010-05-26 DIAGNOSIS — R339 Retention of urine, unspecified: Secondary | ICD-10-CM | POA: Insufficient documentation

## 2010-05-26 DIAGNOSIS — N401 Enlarged prostate with lower urinary tract symptoms: Secondary | ICD-10-CM | POA: Insufficient documentation

## 2010-05-26 DIAGNOSIS — N138 Other obstructive and reflux uropathy: Secondary | ICD-10-CM | POA: Insufficient documentation

## 2010-05-26 DIAGNOSIS — Z01812 Encounter for preprocedural laboratory examination: Secondary | ICD-10-CM | POA: Insufficient documentation

## 2010-05-26 LAB — POCT I-STAT 4, (NA,K, GLUC, HGB,HCT)
Glucose, Bld: 101 mg/dL — ABNORMAL HIGH (ref 70–99)
Glucose, Bld: 101 mg/dL — ABNORMAL HIGH (ref 70–99)
HCT: 34 % — ABNORMAL LOW (ref 39.0–52.0)
HCT: 34 % — ABNORMAL LOW (ref 39.0–52.0)
Hemoglobin: 11.6 g/dL — ABNORMAL LOW (ref 13.0–17.0)
Hemoglobin: 11.6 g/dL — ABNORMAL LOW (ref 13.0–17.0)
Potassium: 6 mEq/L — ABNORMAL HIGH (ref 3.5–5.1)
Potassium: 4.2 mEq/L (ref 3.5–5.1)
Sodium: 135 mEq/L (ref 135–145)
Sodium: 139 mEq/L (ref 135–145)

## 2010-06-04 NOTE — Op Note (Signed)
NAME:  [name] [name], [name] [name]                 ACCOUNT NO.:  [id number]  MEDICAL RECORD NO.:  [id number]           PATIENT TYPE:  LOCATION:                                 FACILITY:  PHYSICIAN:  Zelma Snead C. Karsten Ro, M.D.  DATE OF BIRTH:  Jan 23, 1926  DATE OF PROCEDURE:  05/26/2010 DATE OF DISCHARGE:                              OPERATIVE REPORT   PREOPERATIVE DIAGNOSIS:  Benign prostatic hypertrophy with urinary retention.  POSTOPERATIVE DIAGNOSIS:  Benign prostatic hypertrophy with urinary retention.  PROCEDURE:  Transurethral resection of the prostate.  SURGEON:  Dickson Kostelnik C. Karsten Ro, M.D.  ANESTHESIA:  General.  BLOOD LOSS:  Minimal.  SPECIMENS:  None.  DRAINS:  A 24-French 3-way Foley.  COMPLICATIONS:  None.  INDICATIONS:  The patient is an 77 year old male who developed urinary retention and was placed on maximum medical management.  He eventually was able to get the catheter out but continues to have significant obstructive voiding symptoms despite medical management and we have therefore discussed the treatment options.  He has elected to proceed with transurethral resection of his prostate having understood the risks, complications, and alternatives.  DESCRIPTION OF OPERATION:  After informed consent, the patient was brought to the major OR, placed on the table, and administered general anesthesia and then moved to the dorsal lithotomy position.  His genitalia was sterilely prepped and draped and an official time-out was then performed.  The 28-French resectoscope sheath with Timberlake obturator was then inserted in the bladder and the obturator was removed and replaced with the resectoscope element and 12-degree lens.  The bladder was fully and systematically inspected and noted be free of any tumor, stones or inflammatory lesions.  A 1+ to 2+ trabeculation was noted.  Ureteral orifices were noted be of normal configuration and position, well away from the bladder neck.  I  used the gyrus button to then systematically vaporize from the bladder neck back to the level of the verumontanum first at the 6 o'clock position and then progressing in a counterclockwise fashion in order to resect away the entire left lobe of the prostate down to the surgical capsule.  An identical procedure was performed to the right lobe and then to tissue on the floor of the prostatic urethra.  I resected back to the level of the verumontanum and then inspected for bleeding points and none were noted.  Reinspection of the bladder revealed resection occurred well away from the ureteral orifices, so the bladder was left full and the resectoscope was removed.  When I did this, he had a good forceful stream that ensued and I then placed the 24-French Foley catheter and connected this to continuous flow irrigation and he was awakened and taken to recovery room in stable and satisfactory condition.  He tolerated the procedure well.  There were no intraoperative complications.  He will be observed overnight with plans for discharge in the morning with Pyridium 200 mg #30 and Vicodin HP #30 as well as written discharge instructions and follow up in my office in 1 week.     Tilla Wilborn C. Karsten Ro, M.D.     MCO/MEDQ  D:  05/26/2010  T:  05/26/2010  Job:  [id number]  Electronically Signed by Kathie Rhodes M.D. on 06/04/2010 01:52:36 PM

## 2010-08-08 DIAGNOSIS — R5383 Other fatigue: Secondary | ICD-10-CM | POA: Insufficient documentation

## 2010-10-24 DIAGNOSIS — E291 Testicular hypofunction: Secondary | ICD-10-CM | POA: Insufficient documentation

## 2010-12-08 DIAGNOSIS — M79605 Pain in left leg: Secondary | ICD-10-CM | POA: Insufficient documentation

## 2011-03-13 DIAGNOSIS — F4310 Post-traumatic stress disorder, unspecified: Secondary | ICD-10-CM | POA: Insufficient documentation

## 2011-03-20 ENCOUNTER — Encounter: Payer: Self-pay | Admitting: Cardiology

## 2011-03-20 ENCOUNTER — Other Ambulatory Visit: Payer: Self-pay | Admitting: Cardiology

## 2011-03-20 DIAGNOSIS — E785 Hyperlipidemia, unspecified: Secondary | ICD-10-CM

## 2011-03-20 DIAGNOSIS — N400 Enlarged prostate without lower urinary tract symptoms: Secondary | ICD-10-CM

## 2011-03-20 DIAGNOSIS — E669 Obesity, unspecified: Secondary | ICD-10-CM

## 2011-03-20 DIAGNOSIS — M109 Gout, unspecified: Secondary | ICD-10-CM | POA: Insufficient documentation

## 2011-03-20 DIAGNOSIS — I2510 Atherosclerotic heart disease of native coronary artery without angina pectoris: Secondary | ICD-10-CM | POA: Insufficient documentation

## 2011-03-20 DIAGNOSIS — I10 Essential (primary) hypertension: Secondary | ICD-10-CM

## 2011-03-20 DIAGNOSIS — I872 Venous insufficiency (chronic) (peripheral): Secondary | ICD-10-CM
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
## 2011-03-26 ENCOUNTER — Encounter (HOSPITAL_BASED_OUTPATIENT_CLINIC_OR_DEPARTMENT_OTHER): Payer: Self-pay | Admitting: *Deleted

## 2011-03-26 ENCOUNTER — Inpatient Hospital Stay (HOSPITAL_BASED_OUTPATIENT_CLINIC_OR_DEPARTMENT_OTHER)
Admission: RE | Admit: 2011-03-26 | Discharge: 2011-03-26 | Disposition: A | Discharge status: Home / Self Care | Payer: Medicare Other | Source: Ambulatory Visit | Attending: Cardiology | Admitting: Cardiology

## 2011-03-26 ENCOUNTER — Encounter (HOSPITAL_BASED_OUTPATIENT_CLINIC_OR_DEPARTMENT_OTHER)
Admission: RE | Disposition: A | Discharge status: Home / Self Care | Payer: Self-pay | Source: Ambulatory Visit | Attending: Cardiology

## 2011-03-26 DIAGNOSIS — E785 Hyperlipidemia, unspecified: Secondary | ICD-10-CM | POA: Insufficient documentation

## 2011-03-26 DIAGNOSIS — I2510 Atherosclerotic heart disease of native coronary artery without angina pectoris: Secondary | ICD-10-CM | POA: Insufficient documentation

## 2011-03-26 DIAGNOSIS — I200 Unstable angina: Secondary | ICD-10-CM | POA: Insufficient documentation

## 2011-03-26 DIAGNOSIS — I10 Essential (primary) hypertension: Secondary | ICD-10-CM | POA: Insufficient documentation

## 2011-03-26 DIAGNOSIS — E669 Obesity, unspecified: Secondary | ICD-10-CM | POA: Insufficient documentation

## 2011-03-26 SURGERY — JV LEFT HEART CATHETERIZATION WITH CORONARY ANGIOGRAM
Anesthesia: Moderate Sedation

## 2011-03-26 MED ORDER — SODIUM CHLORIDE 0.9 % IV SOLN
INTRAVENOUS | Status: DC
  Administered 2011-03-26: 07:00:00 via INTRAVENOUS

## 2011-03-26 MED ORDER — SODIUM CHLORIDE 0.9 % IV SOLN: 1.0000 mL/kg/h | INTRAVENOUS | Status: DC

## 2011-03-26 MED ORDER — ASPIRIN 81 MG PO CHEW
324.0000 mg | CHEWABLE_TABLET | ORAL | Status: AC
  Administered 2011-03-26: 324 mg via ORAL

## 2011-03-26 MED ORDER — ACETAMINOPHEN 325 MG PO TABS
650.0000 mg | ORAL_TABLET | ORAL | Status: DC | PRN
Start: 2011-03-26 — End: 2011-03-26

## 2011-03-26 MED ORDER — DIAZEPAM 5 MG PO TABS
5.0000 mg | ORAL_TABLET | ORAL | Status: AC
  Administered 2011-03-26: 5 mg via ORAL
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
## 2011-03-26 NOTE — Progress Notes (Signed)
Discharge instructions completed, ambulated to bathroom without bleeding from right groin site.  Discharged to home via wheelchair with wife.

## 2011-03-26 NOTE — Interval H&P Note (Signed)
History and Physical Interval Note:  03/26/2011 7:38 AM    Patient seen and examined.  No interval change in history and exam since last note.  Stable for procedure.  Kerry Hough. MD [facility]  03/26/2011

## 2011-03-26 NOTE — Progress Notes (Signed)
Bedrest begins @ 0830.

## 2011-03-26 NOTE — CV Procedure (Signed)
Cardiac Catheterization Report   [name] [name]    78 y.o.  male  DOB: 02-25-1926  MRN: [id number]  03/26/2011   PROCEDURE:  Left heart catheterization with selective coronary angiography, left ventriculogram.  INDICATIONS: CAD, abnormal tradmill  The risks, benefits, and details of the procedure were explained to the patient.  The patient verbalized understanding and wanted to proceed.  Informed written consent was obtained.  PROCEDURE TECHNIQUE:  After Xylocaine anesthesia a 80F sheath was placed in the right femoral artery with a single anterior needle wall stick.   Left coronary angiography was done using a Judkins L5 guide catheter.  Right coronary angiography was done using a Judkins R4 guide catheter.  The aortic valve was crossed with a pigtail and guidewire and a 30 cc ventriculogram was performed.  He was taken to the holding area for sheath removal.l He tolerated the procedure well.   CONTRAST:  Total of 80 cc.  COMPLICATIONS:  None.    HEMODYNAMICS:  Aorta post contrast 105/60  LV post contrast 105/5-12.  There was no gradient between the left ventricle and aorta.    ANGIOGRAPHIC DATA:    CORONARY ARTERIES:   Arise and distribute normally.  Right dominant. Significant coronary calcification is noted.  Left main coronary artery: Calcified with 40% ostial stenosis and 40% distal left main stenosis.  Left anterior descending: Heavily calcified.  Moderate proximal irregularity.  805 ostial stenosis of small diagonal branch.  Circumflex coronary artery: Calcified proximal. Moderate proximal stenosis  Right coronary artery: Calcified.  Subtotal occlusion proximal and some left to right collateral.  LEFT VENTRICULOGRAM:  Performed in the 30 RAO projection.  The aortic and mitral valves are normal. The left ventricle is normal in size with normal wall motion. Estimated ejection fraction is 60%  IMPRESSIONS:  1. Diffuse coronary artery  disease with moderate left main and subtotal occlusion of RCA 2. Normal left ventricle.Marland Kitchen  RECOMMENDATION:    Cardiac rehab.  Continued medical therapy.  Cardiac surgery to review case again.   Kerry Hough MD [facility]

## 2011-03-26 NOTE — Discharge Instructions (Signed)
Heart Catheterization Home Care ° ° ° °A catheter was placed through the blood vessel in your groin, contrast was injected into the vessels, and pictures were taken. ° ° You may feel some discomfort at the insertion site after the local anesthetic wears off. This discomfort should gradually improve over the next several days. °· Only take over-the-counter or prescription medicines for pain, discomfort, or fever as directed by your caregiver.  °· Complications are very uncommon after this procedure.Call my office if you develop any of the following symptoms:  °· Worsening pain.  °· Bleeding.  °· Severe swelling at the puncture site.  °· Lightheadedness.  °· Dizziness or fainting.  °· Fever or chills.  °· If oozing, bleeding, or a lump appears at the puncture site, apply firm pressure directly to the site steadily for 15 minutes and go to the emergency department.  °· Keep the skin around the insertion site dry. You may take showers after 24 hours. If the area does get wet, dry the skin completely. Avoid baths until the skin puncture site heals, usually 5 to 7 days.  °· Rest  for the remainder of the day and avoid any heavy lifting (more than 10 pounds or 4.5 kg). Do not operate heavy machinery, drive, or make legal decisions for the first 24 hours after the procedure. Have a responsible person drive you home.  °· You may resume your usual diet after the procedure. Avoid alcoholic beverages for 24 hours after the procedure.  ° °

## 2011-04-12 ENCOUNTER — Encounter (HOSPITAL_COMMUNITY)
Admission: RE | Admit: 2011-04-12 | Discharge: 2011-04-12 | Disposition: A | Discharge status: Home / Self Care | Payer: Medicare Other | Source: Ambulatory Visit | Attending: Cardiology | Admitting: Cardiology

## 2011-04-12 DIAGNOSIS — I872 Venous insufficiency (chronic) (peripheral): Secondary | ICD-10-CM | POA: Insufficient documentation

## 2011-04-12 DIAGNOSIS — I2510 Atherosclerotic heart disease of native coronary artery without angina pectoris: Secondary | ICD-10-CM | POA: Insufficient documentation

## 2011-04-12 DIAGNOSIS — E669 Obesity, unspecified: Secondary | ICD-10-CM | POA: Insufficient documentation

## 2011-04-12 DIAGNOSIS — I10 Essential (primary) hypertension: Secondary | ICD-10-CM | POA: Insufficient documentation

## 2011-04-12 DIAGNOSIS — E785 Hyperlipidemia, unspecified: Secondary | ICD-10-CM | POA: Insufficient documentation

## 2011-04-12 DIAGNOSIS — I209 Angina pectoris, unspecified: Secondary | ICD-10-CM | POA: Insufficient documentation

## 2011-04-12 DIAGNOSIS — Z5189 Encounter for other specified aftercare: Secondary | ICD-10-CM | POA: Insufficient documentation

## 2011-04-12 DIAGNOSIS — I4949 Other premature depolarization: Secondary | ICD-10-CM | POA: Insufficient documentation

## 2011-04-12 NOTE — Progress Notes (Signed)
Cardiac Rehab Medication Review by a Pharmacist  Does the patient  feel that his/her medications are working for him/her?  yes  Has the patient been experiencing any side effects to the medications prescribed?  no  Does the patient measure his/her own blood pressure or blood glucose at home?  yes   Does the patient have any problems obtaining medications due to transportation or finances?   no  Understanding of regimen: fair Understanding of indications: fair Potential of compliance: fair    Pharmacist comments: doing well, but has memory issues    Gerrit Halls, PharmD 04/12/2011 8:43 AM

## 2011-04-16 ENCOUNTER — Encounter (HOSPITAL_COMMUNITY)
Admission: RE | Admit: 2011-04-16 | Discharge: 2011-04-16 | Disposition: A | Discharge status: Home / Self Care | Payer: Medicare Other | Source: Ambulatory Visit | Attending: Cardiology | Admitting: Cardiology

## 2011-04-16 NOTE — Progress Notes (Signed)
Pt started cardiac rehab today.  Pt tolerated light exercise without difficulty.  aysmptomatic with exercise.  Telemetry-NSR, freq multifocal PVC, 10 beat run of PSVT during cool down.  Pt asymptomatic. Pt oriented to exercise equipment and routine.  Understanding verbalized.  Will continue to monitor the patient throughout  the program.

## 2011-04-18 ENCOUNTER — Encounter (HOSPITAL_COMMUNITY): Payer: Self-pay

## 2011-04-18 ENCOUNTER — Encounter (HOSPITAL_COMMUNITY)
Admission: RE | Admit: 2011-04-18 | Discharge: 2011-04-18 | Disposition: A | Discharge status: Home / Self Care | Payer: Medicare Other | Source: Ambulatory Visit | Attending: Cardiology | Admitting: Cardiology

## 2011-04-20 ENCOUNTER — Encounter (HOSPITAL_COMMUNITY)
Admission: RE | Admit: 2011-04-20 | Discharge: 2011-04-20 | Disposition: A | Discharge status: Home / Self Care | Payer: Medicare Other | Source: Ambulatory Visit | Attending: Cardiology | Admitting: Cardiology

## 2011-04-23 ENCOUNTER — Encounter (HOSPITAL_COMMUNITY)
Admission: RE | Admit: 2011-04-23 | Discharge: 2011-04-23 | Disposition: A | Discharge status: Home / Self Care | Payer: Medicare Other | Source: Ambulatory Visit | Attending: Cardiology | Admitting: Cardiology

## 2011-04-25 ENCOUNTER — Encounter (HOSPITAL_COMMUNITY)
Admission: RE | Admit: 2011-04-25 | Discharge: 2011-04-25 | Disposition: A | Discharge status: Home / Self Care | Payer: Medicare Other | Source: Ambulatory Visit | Attending: Cardiology | Admitting: Cardiology

## 2011-04-27 ENCOUNTER — Encounter (HOSPITAL_COMMUNITY)
Admission: RE | Admit: 2011-04-27 | Discharge: 2011-04-27 | Disposition: A | Discharge status: Home / Self Care | Payer: Medicare Other | Source: Ambulatory Visit | Attending: Cardiology | Admitting: Cardiology

## 2011-04-30 ENCOUNTER — Encounter (HOSPITAL_COMMUNITY)
Admission: RE | Admit: 2011-04-30 | Discharge: 2011-04-30 | Disposition: A | Discharge status: Home / Self Care | Payer: Medicare Other | Source: Ambulatory Visit | Attending: Cardiology | Admitting: Cardiology

## 2011-05-02 ENCOUNTER — Encounter (HOSPITAL_COMMUNITY)
Admission: RE | Admit: 2011-05-02 | Discharge: 2011-05-02 | Disposition: A | Discharge status: Home / Self Care | Payer: Medicare Other | Source: Ambulatory Visit | Attending: Cardiology | Admitting: Cardiology

## 2011-05-02 DIAGNOSIS — I10 Essential (primary) hypertension: Secondary | ICD-10-CM | POA: Insufficient documentation

## 2011-05-02 DIAGNOSIS — E669 Obesity, unspecified: Secondary | ICD-10-CM | POA: Insufficient documentation

## 2011-05-02 DIAGNOSIS — E785 Hyperlipidemia, unspecified: Secondary | ICD-10-CM | POA: Insufficient documentation

## 2011-05-02 DIAGNOSIS — I209 Angina pectoris, unspecified: Secondary | ICD-10-CM | POA: Insufficient documentation

## 2011-05-02 DIAGNOSIS — I2510 Atherosclerotic heart disease of native coronary artery without angina pectoris: Secondary | ICD-10-CM | POA: Insufficient documentation

## 2011-05-02 DIAGNOSIS — I4949 Other premature depolarization: Secondary | ICD-10-CM | POA: Insufficient documentation

## 2011-05-02 DIAGNOSIS — I872 Venous insufficiency (chronic) (peripheral): Secondary | ICD-10-CM | POA: Insufficient documentation

## 2011-05-02 DIAGNOSIS — Z5189 Encounter for other specified aftercare: Secondary | ICD-10-CM | POA: Insufficient documentation

## 2011-05-04 ENCOUNTER — Encounter (HOSPITAL_COMMUNITY)
Admission: RE | Admit: 2011-05-04 | Discharge: 2011-05-04 | Disposition: A | Discharge status: Home / Self Care | Payer: Medicare Other | Source: Ambulatory Visit | Attending: Cardiology | Admitting: Cardiology

## 2011-05-07 ENCOUNTER — Encounter (HOSPITAL_COMMUNITY)
Admission: RE | Admit: 2011-05-07 | Discharge: 2011-05-07 | Disposition: A | Discharge status: Home / Self Care | Payer: Medicare Other | Source: Ambulatory Visit | Attending: Cardiology | Admitting: Cardiology

## 2011-05-09 ENCOUNTER — Encounter (HOSPITAL_COMMUNITY)
Admission: RE | Admit: 2011-05-09 | Discharge: 2011-05-09 | Disposition: A | Discharge status: Home / Self Care | Payer: Medicare Other | Source: Ambulatory Visit | Attending: Cardiology | Admitting: Cardiology

## 2011-05-09 ENCOUNTER — Other Ambulatory Visit: Payer: Self-pay | Admitting: Cardiology

## 2011-05-11 ENCOUNTER — Encounter (HOSPITAL_COMMUNITY)
Admission: RE | Admit: 2011-05-11 | Discharge: 2011-05-11 | Disposition: A | Discharge status: Home / Self Care | Payer: Medicare Other | Source: Ambulatory Visit | Attending: Cardiology | Admitting: Cardiology

## 2011-05-13 ENCOUNTER — Other Ambulatory Visit: Payer: Self-pay | Admitting: Cardiology

## 2011-05-14 ENCOUNTER — Encounter (HOSPITAL_COMMUNITY)
Admission: RE | Admit: 2011-05-14 | Discharge: 2011-05-14 | Disposition: A | Discharge status: Home / Self Care | Payer: Medicare Other | Source: Ambulatory Visit | Attending: Cardiology | Admitting: Cardiology

## 2011-05-16 ENCOUNTER — Encounter (HOSPITAL_COMMUNITY)
Admission: RE | Admit: 2011-05-16 | Discharge: 2011-05-16 | Disposition: A | Discharge status: Home / Self Care | Payer: Medicare Other | Source: Ambulatory Visit | Attending: Cardiology | Admitting: Cardiology

## 2011-05-18 ENCOUNTER — Encounter (HOSPITAL_COMMUNITY)
Admission: RE | Admit: 2011-05-18 | Discharge: 2011-05-18 | Disposition: A | Discharge status: Home / Self Care | Payer: Medicare Other | Source: Ambulatory Visit | Attending: Cardiology | Admitting: Cardiology

## 2011-05-21 ENCOUNTER — Encounter (HOSPITAL_COMMUNITY): Payer: Medicare Other

## 2011-05-23 ENCOUNTER — Encounter (HOSPITAL_COMMUNITY)
Admission: RE | Admit: 2011-05-23 | Discharge: 2011-05-23 | Disposition: A | Discharge status: Home / Self Care | Payer: Medicare Other | Source: Ambulatory Visit | Attending: Cardiology | Admitting: Cardiology

## 2011-05-25 ENCOUNTER — Encounter (HOSPITAL_COMMUNITY)
Admission: RE | Admit: 2011-05-25 | Discharge: 2011-05-25 | Disposition: A | Discharge status: Home / Self Care | Payer: Medicare Other | Source: Ambulatory Visit | Attending: Cardiology | Admitting: Cardiology

## 2011-05-28 ENCOUNTER — Encounter (HOSPITAL_COMMUNITY): Payer: Medicare Other

## 2011-05-30 ENCOUNTER — Encounter (HOSPITAL_COMMUNITY): Payer: Medicare Other

## 2011-05-30 ENCOUNTER — Encounter (HOSPITAL_COMMUNITY)
Admission: RE | Admit: 2011-05-30 | Discharge: 2011-05-30 | Disposition: A | Discharge status: Home / Self Care | Payer: Medicare Other | Source: Ambulatory Visit | Attending: Cardiology | Admitting: Cardiology

## 2011-06-01 ENCOUNTER — Encounter (HOSPITAL_COMMUNITY): Payer: Medicare Other

## 2011-06-04 ENCOUNTER — Encounter (HOSPITAL_COMMUNITY)
Admission: RE | Admit: 2011-06-04 | Discharge: 2011-06-04 | Disposition: A | Discharge status: Home / Self Care | Payer: Medicare Other | Source: Ambulatory Visit | Attending: Cardiology | Admitting: Cardiology

## 2011-06-04 DIAGNOSIS — I872 Venous insufficiency (chronic) (peripheral): Secondary | ICD-10-CM | POA: Insufficient documentation

## 2011-06-04 DIAGNOSIS — I4949 Other premature depolarization: Secondary | ICD-10-CM | POA: Insufficient documentation

## 2011-06-04 DIAGNOSIS — I209 Angina pectoris, unspecified: Secondary | ICD-10-CM | POA: Insufficient documentation

## 2011-06-04 DIAGNOSIS — E669 Obesity, unspecified: Secondary | ICD-10-CM | POA: Insufficient documentation

## 2011-06-04 DIAGNOSIS — I10 Essential (primary) hypertension: Secondary | ICD-10-CM | POA: Insufficient documentation

## 2011-06-04 DIAGNOSIS — Z5189 Encounter for other specified aftercare: Secondary | ICD-10-CM | POA: Insufficient documentation

## 2011-06-04 DIAGNOSIS — E785 Hyperlipidemia, unspecified: Secondary | ICD-10-CM | POA: Insufficient documentation

## 2011-06-04 DIAGNOSIS — I2510 Atherosclerotic heart disease of native coronary artery without angina pectoris: Secondary | ICD-10-CM | POA: Insufficient documentation

## 2011-06-06 ENCOUNTER — Encounter (HOSPITAL_COMMUNITY)
Admission: RE | Admit: 2011-06-06 | Discharge: 2011-06-06 | Disposition: A | Discharge status: Home / Self Care | Payer: Medicare Other | Source: Ambulatory Visit | Attending: Cardiology | Admitting: Cardiology

## 2011-06-08 ENCOUNTER — Encounter (HOSPITAL_COMMUNITY)
Admission: RE | Admit: 2011-06-08 | Discharge: 2011-06-08 | Disposition: A | Discharge status: Home / Self Care | Payer: Medicare Other | Source: Ambulatory Visit | Attending: Cardiology | Admitting: Cardiology

## 2011-06-11 ENCOUNTER — Encounter (HOSPITAL_COMMUNITY)
Admission: RE | Admit: 2011-06-11 | Discharge: 2011-06-11 | Disposition: A | Discharge status: Home / Self Care | Payer: Medicare Other | Source: Ambulatory Visit | Attending: Cardiology | Admitting: Cardiology

## 2011-06-11 NOTE — Progress Notes (Signed)
Pt c/o right shoulder discomfort while walking track.  Pt states " i don't have my NTG with me"  I offered pt NTG however he declined stating " i prefer to work through it"  Pt describes as typical for him.  Rates 2/10.  Pain did not increase with activity.  Will notify Dr. Thurman Coyer office and continue to monitor.

## 2011-06-13 ENCOUNTER — Encounter (HOSPITAL_COMMUNITY)
Admission: RE | Admit: 2011-06-13 | Discharge: 2011-06-13 | Disposition: A | Discharge status: Home / Self Care | Payer: Medicare Other | Source: Ambulatory Visit | Attending: Cardiology | Admitting: Cardiology

## 2011-06-15 ENCOUNTER — Encounter (HOSPITAL_COMMUNITY)
Admission: RE | Admit: 2011-06-15 | Discharge: 2011-06-15 | Disposition: A | Discharge status: Home / Self Care | Payer: Medicare Other | Source: Ambulatory Visit | Attending: Cardiology | Admitting: Cardiology

## 2011-06-15 NOTE — Progress Notes (Addendum)
Pt c/o right shoulder discomfort while exercising today.  Pt describes as typical for him.  Pt rates 2/10, pain is not limiting and pt states " I prefer to work through it".  Pt states he often has this discomfort at home is an early anginal symptom, however pt declines radiation to chest. Pt states he does not take NTG unless he develops chest discomfort, discomfort resolved during cool down. Pt states he has not taken NTG since prior to last cath.  Juliann Pulse, Dr. Thurman Coyer office made aware, will review with Dr. Wynonia Lawman.  No new orders received.  Pt pain free prior to leaving cardiac rehab.  Proper NTG use and when to call 911 reviewed with pt, understanding verbalized

## 2011-06-18 ENCOUNTER — Encounter (HOSPITAL_COMMUNITY)
Admission: RE | Admit: 2011-06-18 | Discharge: 2011-06-18 | Disposition: A | Discharge status: Home / Self Care | Payer: Medicare Other | Source: Ambulatory Visit | Attending: Cardiology | Admitting: Cardiology

## 2011-06-18 NOTE — Progress Notes (Signed)
Pt c.o chest discomfort during warm up today, rates 1-2/10.  Discomfort relieved with rest.  Pt also experienced right shoulder discomfort during first 10 minutes of exericse while using NuStep.  Pt states shoulder pain also relieved with rest. Exercise stopped.   Phone call to Dr. Thurman Coyer office.  Juliann Pulse, Dr. Thurman Coyer nurse will review with Dr. Wynonia Lawman and call patient at home.  Pt advised to present to ED for worsening or unrelieved sx .  Understanding verbalized.

## 2011-06-20 ENCOUNTER — Encounter (HOSPITAL_COMMUNITY)
Admission: RE | Admit: 2011-06-20 | Discharge: 2011-06-20 | Disposition: A | Discharge status: Home / Self Care | Payer: Medicare Other | Source: Ambulatory Visit | Attending: Cardiology | Admitting: Cardiology

## 2011-06-22 ENCOUNTER — Encounter (HOSPITAL_COMMUNITY)
Admission: RE | Admit: 2011-06-22 | Discharge: 2011-06-22 | Disposition: A | Discharge status: Home / Self Care | Payer: Medicare Other | Source: Ambulatory Visit | Attending: Cardiology | Admitting: Cardiology

## 2011-06-25 ENCOUNTER — Encounter (HOSPITAL_COMMUNITY)
Admission: RE | Admit: 2011-06-25 | Discharge: 2011-06-25 | Disposition: A | Discharge status: Home / Self Care | Payer: Medicare Other | Source: Ambulatory Visit | Attending: Cardiology | Admitting: Cardiology

## 2011-06-27 ENCOUNTER — Encounter (HOSPITAL_COMMUNITY)
Admission: RE | Admit: 2011-06-27 | Discharge: 2011-06-27 | Disposition: A | Discharge status: Home / Self Care | Payer: Medicare Other | Source: Ambulatory Visit | Attending: Cardiology | Admitting: Cardiology

## 2011-06-29 ENCOUNTER — Encounter (HOSPITAL_COMMUNITY)
Admission: RE | Admit: 2011-06-29 | Discharge: 2011-06-29 | Disposition: A | Discharge status: Home / Self Care | Payer: Medicare Other | Source: Ambulatory Visit | Attending: Cardiology | Admitting: Cardiology

## 2011-07-02 ENCOUNTER — Encounter (HOSPITAL_COMMUNITY)
Admission: RE | Admit: 2011-07-02 | Discharge: 2011-07-02 | Disposition: A | Discharge status: Home / Self Care | Payer: Medicare Other | Source: Ambulatory Visit | Attending: Cardiology | Admitting: Cardiology

## 2011-07-02 DIAGNOSIS — E669 Obesity, unspecified: Secondary | ICD-10-CM | POA: Insufficient documentation

## 2011-07-02 DIAGNOSIS — E785 Hyperlipidemia, unspecified: Secondary | ICD-10-CM | POA: Insufficient documentation

## 2011-07-02 DIAGNOSIS — I872 Venous insufficiency (chronic) (peripheral): Secondary | ICD-10-CM | POA: Insufficient documentation

## 2011-07-02 DIAGNOSIS — I10 Essential (primary) hypertension: Secondary | ICD-10-CM | POA: Insufficient documentation

## 2011-07-02 DIAGNOSIS — Z5189 Encounter for other specified aftercare: Secondary | ICD-10-CM | POA: Insufficient documentation

## 2011-07-02 DIAGNOSIS — I209 Angina pectoris, unspecified: Secondary | ICD-10-CM | POA: Insufficient documentation

## 2011-07-02 DIAGNOSIS — I2510 Atherosclerotic heart disease of native coronary artery without angina pectoris: Secondary | ICD-10-CM | POA: Insufficient documentation

## 2011-07-02 DIAGNOSIS — I4949 Other premature depolarization: Secondary | ICD-10-CM | POA: Insufficient documentation

## 2011-07-02 NOTE — Progress Notes (Signed)
Pt arrived to cardiac rehab c/o dull headache present since yesterday, gradual onset.  Pt reports he forgot to take his AM meds yesterday morning.  Pt states some relief of sx with exercise.  Will continue to monitor.

## 2011-07-04 ENCOUNTER — Encounter (HOSPITAL_COMMUNITY)
Admission: RE | Admit: 2011-07-04 | Discharge: 2011-07-04 | Disposition: A | Discharge status: Home / Self Care | Payer: Medicare Other | Source: Ambulatory Visit | Attending: Cardiology | Admitting: Cardiology

## 2011-07-06 ENCOUNTER — Encounter (HOSPITAL_COMMUNITY)
Admission: RE | Admit: 2011-07-06 | Discharge: 2011-07-06 | Disposition: A | Discharge status: Home / Self Care | Payer: Medicare Other | Source: Ambulatory Visit | Attending: Cardiology | Admitting: Cardiology

## 2011-07-09 ENCOUNTER — Encounter (HOSPITAL_COMMUNITY): Payer: Medicare Other

## 2011-07-11 ENCOUNTER — Encounter (HOSPITAL_COMMUNITY)
Admission: RE | Admit: 2011-07-11 | Discharge: 2011-07-11 | Disposition: A | Discharge status: Home / Self Care | Payer: Medicare Other | Source: Ambulatory Visit | Attending: Cardiology | Admitting: Cardiology

## 2011-07-13 ENCOUNTER — Encounter (HOSPITAL_COMMUNITY)
Admission: RE | Admit: 2011-07-13 | Discharge: 2011-07-13 | Disposition: A | Discharge status: Home / Self Care | Payer: Medicare Other | Source: Ambulatory Visit | Attending: Cardiology | Admitting: Cardiology

## 2011-07-13 ENCOUNTER — Encounter (HOSPITAL_COMMUNITY): Payer: Self-pay

## 2011-07-16 ENCOUNTER — Encounter (HOSPITAL_COMMUNITY): Payer: Medicare Other

## 2011-07-16 ENCOUNTER — Encounter (HOSPITAL_COMMUNITY)
Admission: RE | Admit: 2011-07-16 | Discharge: 2011-07-16 | Disposition: A | Discharge status: Home / Self Care | Payer: Medicare Other | Source: Ambulatory Visit | Attending: Cardiology | Admitting: Cardiology

## 2011-07-16 NOTE — Progress Notes (Signed)
Pt graduates today from Cardiac Rehab.

## 2011-07-18 ENCOUNTER — Encounter (HOSPITAL_COMMUNITY): Payer: Medicare Other

## 2011-07-20 ENCOUNTER — Encounter (HOSPITAL_COMMUNITY): Payer: Medicare Other

## 2011-08-28 NOTE — Progress Notes (Signed)
Pt successfully completed cardiac rehab attending 36/36 exercise and 20 education sessions. Pt had excellent participation.   Pt VSS, Telemetry-Sinus rhythm, occasional PVC, brief 10 beat run of PAT.  Pt asymptomatic with ectopy report faxed to Dr. Wynonia Lawman.  Pt did experience stable angina on exertion during cardiac rehab period, Dr. Wynonia Lawman aware, no change in pt regimen.  Pt mets increased from 2.2 to 3.2. Pt plans to exercise on his own.   Pt has made positive lifestyle changes and should be commended for his efforts.  Pt was a pleasure to work with.  Thank you for the referral.

## 2011-10-17 DIAGNOSIS — Z1331 Encounter for screening for depression: Secondary | ICD-10-CM | POA: Insufficient documentation

## 2011-12-03 DIAGNOSIS — W57XXXA Bitten or stung by nonvenomous insect and other nonvenomous arthropods, initial encounter: Secondary | ICD-10-CM | POA: Insufficient documentation

## 2012-02-29 ENCOUNTER — Inpatient Hospital Stay (HOSPITAL_COMMUNITY)
Admission: EM | Admit: 2012-02-29 | Discharge: 2012-03-13 | DRG: 233 | Disposition: A | Discharge status: Home / Self Care | Payer: Medicare Other | Attending: Thoracic Surgery (Cardiothoracic Vascular Surgery) | Admitting: Thoracic Surgery (Cardiothoracic Vascular Surgery)

## 2012-02-29 ENCOUNTER — Encounter (HOSPITAL_COMMUNITY): Payer: Self-pay

## 2012-02-29 ENCOUNTER — Emergency Department (HOSPITAL_COMMUNITY): Payer: Medicare Other

## 2012-02-29 DIAGNOSIS — I4891 Unspecified atrial fibrillation: Principal | ICD-10-CM

## 2012-02-29 DIAGNOSIS — I249 Acute ischemic heart disease, unspecified: Secondary | ICD-10-CM | POA: Diagnosis present

## 2012-02-29 DIAGNOSIS — I2510 Atherosclerotic heart disease of native coronary artery without angina pectoris: Secondary | ICD-10-CM | POA: Diagnosis present

## 2012-02-29 DIAGNOSIS — Z951 Presence of aortocoronary bypass graft: Secondary | ICD-10-CM

## 2012-02-29 DIAGNOSIS — I214 Non-ST elevation (NSTEMI) myocardial infarction: Secondary | ICD-10-CM | POA: Diagnosis present

## 2012-02-29 DIAGNOSIS — I480 Paroxysmal atrial fibrillation: Secondary | ICD-10-CM

## 2012-02-29 DIAGNOSIS — I509 Heart failure, unspecified: Secondary | ICD-10-CM | POA: Diagnosis present

## 2012-02-29 DIAGNOSIS — N400 Enlarged prostate without lower urinary tract symptoms: Secondary | ICD-10-CM | POA: Diagnosis present

## 2012-02-29 DIAGNOSIS — E119 Type 2 diabetes mellitus without complications: Secondary | ICD-10-CM | POA: Diagnosis present

## 2012-02-29 DIAGNOSIS — I200 Unstable angina: Secondary | ICD-10-CM

## 2012-02-29 DIAGNOSIS — Z8679 Personal history of other diseases of the circulatory system: Secondary | ICD-10-CM

## 2012-02-29 DIAGNOSIS — E785 Hyperlipidemia, unspecified: Secondary | ICD-10-CM | POA: Diagnosis present

## 2012-02-29 DIAGNOSIS — E669 Obesity, unspecified: Secondary | ICD-10-CM | POA: Diagnosis present

## 2012-02-29 DIAGNOSIS — Z79899 Other long term (current) drug therapy: Secondary | ICD-10-CM

## 2012-02-29 DIAGNOSIS — M109 Gout, unspecified: Secondary | ICD-10-CM | POA: Diagnosis present

## 2012-02-29 DIAGNOSIS — I5021 Acute systolic (congestive) heart failure: Secondary | ICD-10-CM | POA: Diagnosis present

## 2012-02-29 DIAGNOSIS — Z9889 Other specified postprocedural states: Secondary | ICD-10-CM

## 2012-02-29 HISTORY — DX: Presence of aortocoronary bypass graft: Z95.1

## 2012-02-29 HISTORY — DX: Other specified postprocedural states: Z98.890

## 2012-02-29 HISTORY — DX: Thoracic aortic ectasia: I77.810

## 2012-02-29 HISTORY — DX: Personal history of other diseases of the circulatory system: Z86.79

## 2012-02-29 HISTORY — DX: Unspecified atrial fibrillation: I48.91

## 2012-02-29 HISTORY — DX: Acute ischemic heart disease, unspecified: I24.9

## 2012-02-29 HISTORY — DX: Paroxysmal atrial fibrillation: I48.0

## 2012-02-29 HISTORY — DX: Acute systolic (congestive) heart failure: I50.21

## 2012-02-29 HISTORY — DX: Unspecified glaucoma: H40.9

## 2012-02-29 LAB — POCT I-STAT, CHEM 8
BUN: 26 mg/dL — ABNORMAL HIGH (ref 6–23)
Calcium, Ion: 1.21 mmol/L (ref 1.13–1.30)
HCT: 49 % (ref 39.0–52.0)
Sodium: 138 mEq/L (ref 135–145)
TCO2: 24 mmol/L (ref 0–100)

## 2012-02-29 LAB — POCT I-STAT TROPONIN I: Troponin i, poc: 0 ng/mL (ref 0.00–0.08)

## 2012-02-29 MED ORDER — FENTANYL CITRATE 0.05 MG/ML IJ SOLN: 100.0000 ug | Freq: Once | INTRAMUSCULAR | Status: AC

## 2012-02-29 MED ORDER — MORPHINE SULFATE 4 MG/ML IJ SOLN
4.0000 mg | Freq: Once | INTRAMUSCULAR | Status: AC
  Administered 2012-02-29: 4 mg via INTRAVENOUS
  Filled 2012-02-29: qty 1

## 2012-02-29 MED ORDER — HEPARIN BOLUS VIA INFUSION
4000.0000 [IU] | Freq: Once | INTRAVENOUS | Status: AC
  Administered 2012-02-29: 4000 [IU] via INTRAVENOUS

## 2012-02-29 MED ORDER — MIDAZOLAM HCL 2 MG/2ML IJ SOLN
INTRAMUSCULAR | Status: AC
  Filled 2012-02-29: qty 6

## 2012-02-29 MED ORDER — HEPARIN (PORCINE) IN NACL 100-0.45 UNIT/ML-% IJ SOLN
1400.0000 [IU]/h | INTRAMUSCULAR | Status: DC
  Administered 2012-02-29: 1000 [IU]/h via INTRAVENOUS
  Administered 2012-03-01: 1300 [IU]/h via INTRAVENOUS
  Administered 2012-03-01 – 2012-03-02 (×2): 1400 [IU]/h via INTRAVENOUS
  Filled 2012-02-29 (×3): qty 250

## 2012-02-29 MED ORDER — FENTANYL CITRATE 0.05 MG/ML IJ SOLN
INTRAMUSCULAR | Status: AC
  Administered 2012-02-29: 100 ug via INTRAVENOUS
  Filled 2012-02-29: qty 2

## 2012-02-29 MED ORDER — DILTIAZEM HCL 100 MG IV SOLR: 5.0000 mg/h | INTRAVENOUS | Status: DC

## 2012-02-29 MED ORDER — MIDAZOLAM HCL 2 MG/2ML IJ SOLN
5.0000 mg | Freq: Once | INTRAMUSCULAR | Status: AC
  Administered 2012-02-29: 5 mg via INTRAVENOUS

## 2012-02-29 MED ORDER — DILTIAZEM LOAD VIA INFUSION
20.0000 mg | Freq: Once | INTRAVENOUS | Status: DC
  Filled 2012-02-29: qty 20

## 2012-02-29 NOTE — ED Notes (Signed)
EMS-pt to ed from home with chest pain.  Started tonight during supper.  Pt took total of 3 nitro and 324 ASA at home with no relief still was 8/10.  Pt has hx of angina.

## 2012-02-29 NOTE — ED Notes (Signed)
Cardiologist at bedside.  Pt in AFIB now.  Pads applied and pt was shocked at 2301 at [place].  Pt now in NSR at 79 bpm.

## 2012-02-29 NOTE — ED Notes (Signed)
Into room to give meds to pt.  Pt states feeling much worse now.  Pt appears diaphoretic and bp is low.  md aware and is at bedside.

## 2012-02-29 NOTE — ED Notes (Signed)
Pt's BP dropped to low 80s. Opened fluids wide.

## 2012-03-01 DIAGNOSIS — I4891 Unspecified atrial fibrillation: Secondary | ICD-10-CM

## 2012-03-01 DIAGNOSIS — I2510 Atherosclerotic heart disease of native coronary artery without angina pectoris: Secondary | ICD-10-CM

## 2012-03-01 LAB — CBC
HCT: 45.9 % (ref 39.0–52.0)
Hemoglobin: 15.8 g/dL (ref 13.0–17.0)
MCH: 31.7 pg (ref 26.0–34.0)
MCHC: 34.4 g/dL (ref 30.0–36.0)
MCV: 92 fL (ref 78.0–100.0)
RBC: 4.99 MIL/uL (ref 4.22–5.81)

## 2012-03-01 LAB — COMPREHENSIVE METABOLIC PANEL
ALT: 15 U/L (ref 0–53)
AST: 19 U/L (ref 0–37)
Albumin: 2.9 g/dL — ABNORMAL LOW (ref 3.5–5.2)
Calcium: 8.6 mg/dL (ref 8.4–10.5)
Creatinine, Ser: 1.12 mg/dL (ref 0.50–1.35)
GFR calc non Af Amer: 59 mL/min — ABNORMAL LOW (ref >90)
Sodium: 136 mEq/L (ref 135–145)
Total Protein: 5.9 g/dL — ABNORMAL LOW (ref 6.0–8.3)

## 2012-03-01 LAB — TSH: TSH: 2.183 u[IU]/mL (ref 0.350–4.500)

## 2012-03-01 LAB — TROPONIN I: Troponin I: 0.37 ng/mL (ref <0.30)

## 2012-03-01 LAB — PROTIME-INR: INR: 1 (ref 0.00–1.49)

## 2012-03-01 MED ORDER — ASPIRIN 81 MG PO CHEW
81.0000 mg | CHEWABLE_TABLET | Freq: Every day | ORAL | Status: DC
  Administered 2012-03-01 – 2012-03-05 (×4): 81 mg via ORAL
  Filled 2012-03-01 (×4): qty 1

## 2012-03-01 MED ORDER — ATORVASTATIN CALCIUM 40 MG PO TABS
40.0000 mg | ORAL_TABLET | Freq: Every day | ORAL | Status: DC
  Administered 2012-03-01 – 2012-03-05 (×5): 40 mg via ORAL
  Filled 2012-03-01 (×6): qty 1

## 2012-03-01 MED ORDER — AMIODARONE HCL IN DEXTROSE 360-4.14 MG/200ML-% IV SOLN
1.0000 mg/min | INTRAVENOUS | Status: AC
  Administered 2012-03-01 (×2): 1 mg/min via INTRAVENOUS
  Filled 2012-03-01 (×2): qty 200

## 2012-03-01 MED ORDER — EZETIMIBE 10 MG PO TABS
10.0000 mg | ORAL_TABLET | Freq: Every day | ORAL | Status: DC
  Administered 2012-03-01 – 2012-03-05 (×5): 10 mg via ORAL
  Filled 2012-03-01 (×6): qty 1

## 2012-03-01 MED ORDER — ISOSORBIDE MONONITRATE ER 60 MG PO TB24
120.0000 mg | ORAL_TABLET | Freq: Every day | ORAL | Status: DC
  Filled 2012-03-01: qty 2

## 2012-03-01 MED ORDER — AMIODARONE LOAD VIA INFUSION
150.0000 mg | Freq: Once | INTRAVENOUS | Status: DC
  Administered 2012-03-01: 150 mg via INTRAVENOUS

## 2012-03-01 MED ORDER — AMIODARONE LOAD VIA INFUSION
150.0000 mg | Freq: Once | INTRAVENOUS | Status: AC
Start: 2012-03-01 — End: 2012-03-01
  Administered 2012-03-01: 150 mg via INTRAVENOUS
  Filled 2012-03-01: qty 83.34

## 2012-03-01 MED ORDER — SODIUM CHLORIDE 0.9 % IJ SOLN
3.0000 mL | Freq: Two times a day (BID) | INTRAMUSCULAR | Status: DC
  Administered 2012-03-01: 3 mL via INTRAVENOUS
  Administered 2012-03-01: 02:00:00 via INTRAVENOUS
  Administered 2012-03-04 – 2012-03-05 (×4): 3 mL via INTRAVENOUS

## 2012-03-01 MED ORDER — GALANTAMINE HYDROBROMIDE 4 MG PO TABS
4.0000 mg | ORAL_TABLET | Freq: Two times a day (BID) | ORAL | Status: DC
  Administered 2012-03-01 – 2012-03-05 (×10): 4 mg via ORAL
  Filled 2012-03-01 (×15): qty 1

## 2012-03-01 MED ORDER — METOPROLOL SUCCINATE ER 50 MG PO TB24
50.0000 mg | ORAL_TABLET | Freq: Every day | ORAL | Status: DC
  Administered 2012-03-02 – 2012-03-05 (×4): 50 mg via ORAL
  Filled 2012-03-01 (×6): qty 1

## 2012-03-01 MED ORDER — ONDANSETRON HCL 4 MG/2ML IJ SOLN: 4.0000 mg | Freq: Four times a day (QID) | INTRAMUSCULAR | Status: DC | PRN

## 2012-03-01 MED ORDER — MIDAZOLAM HCL 2 MG/2ML IJ SOLN
INTRAMUSCULAR | Status: AC
  Filled 2012-03-01: qty 4

## 2012-03-01 MED ORDER — ALLOPURINOL 150 MG HALF TABLET
150.0000 mg | ORAL_TABLET | Freq: Every day | ORAL | Status: DC
  Administered 2012-03-01 – 2012-03-05 (×5): 150 mg via ORAL
  Filled 2012-03-01 (×7): qty 1

## 2012-03-01 MED ORDER — AMIODARONE HCL IN DEXTROSE 360-4.14 MG/200ML-% IV SOLN
0.5000 mg/min | INTRAVENOUS | Status: DC
  Administered 2012-03-01: 0.5 mg/min via INTRAVENOUS
  Administered 2012-03-01: 0.999 mg/min via INTRAVENOUS
  Administered 2012-03-02 – 2012-03-04 (×5): 0.5 mg/min via INTRAVENOUS
  Filled 2012-03-01 (×14): qty 200

## 2012-03-01 MED ORDER — FUROSEMIDE 20 MG PO TABS
20.0000 mg | ORAL_TABLET | Freq: Every day | ORAL | Status: DC
  Filled 2012-03-01: qty 1

## 2012-03-01 MED ORDER — NITROGLYCERIN IN D5W 200-5 MCG/ML-% IV SOLN
2.0000 ug/min | INTRAVENOUS | Status: DC
  Administered 2012-03-01: 16.667 ug/min via INTRAVENOUS
  Administered 2012-03-02: 40 ug/min via INTRAVENOUS
  Administered 2012-03-03: 35 ug/min via INTRAVENOUS
  Administered 2012-03-04: 116.667 ug/min via INTRAVENOUS
  Administered 2012-03-05 (×2): 40 ug/min via INTRAVENOUS
  Filled 2012-03-01 (×6): qty 250

## 2012-03-01 MED ORDER — ACETAMINOPHEN 325 MG PO TABS
650.0000 mg | ORAL_TABLET | ORAL | Status: DC | PRN
  Administered 2012-03-01 – 2012-03-05 (×3): 650 mg via ORAL
  Filled 2012-03-01 (×3): qty 2

## 2012-03-01 MED ORDER — BENAZEPRIL HCL 5 MG PO TABS
5.0000 mg | ORAL_TABLET | Freq: Every day | ORAL | Status: DC
  Filled 2012-03-01: qty 1

## 2012-03-01 MED ORDER — FENTANYL CITRATE 0.05 MG/ML IJ SOLN
INTRAMUSCULAR | Status: AC
  Filled 2012-03-01: qty 2

## 2012-03-01 MED ORDER — SODIUM CHLORIDE 0.9 % IV SOLN
INTRAVENOUS | Status: DC
  Administered 2012-03-01 (×2): via INTRAVENOUS
  Administered 2012-03-02: 75 mL/h via INTRAVENOUS
  Administered 2012-03-03 – 2012-03-05 (×4): via INTRAVENOUS

## 2012-03-01 NOTE — Progress Notes (Signed)
ANTICOAGULATION CONSULT NOTE - Follow Up Consult  Pharmacy Consult for Heparin Indication: atrial fibrillation  Allergies  Allergen Reactions  . Bee Venom Anaphylaxis    Patient Measurements: Height: 6\' 2"  (188 cm) Weight: 234 lb 12.6 oz (106.5 kg) IBW/kg (Calculated) : 82.2 Heparin Dosing Weight: 104 kg  Vital Signs: Temp: 97.5 F (36.4 C) (03/01 1113) Temp src: Oral (03/01 1113) BP: 93/48 mmHg (03/01 1115) Pulse Rate: 84 (03/01 1115)  Labs:  Recent Labs  02/29/12 2255 03/01/12 0904  HGB 16.7 15.8  HCT 49.0 45.9  PLT  --  139*  LABPROT  --  13.1  INR  --  1.00  HEPARINUNFRC  --  0.41  CREATININE 1.30 1.12  TROPONINI  --  0.37*    Estimated Creatinine Clearance: 65 ml/min (by C-G formula based on Cr of 1.12).  Assessment:   Initial heparin level is low therapeutic on 1300 units/hr. Baseline platelet count 139.    Goal of Therapy:  Heparin level 0.3-0.7 units/ml Monitor platelets by anticoagulation protocol: Yes   Plan:   Increase heparin drip to 1400 units/hr, to try to keep in target range.  Next heparin level and CBC in am.  Arty Baumgartner, [place] Pager: [phone] 03/01/2012,11:49 AM

## 2012-03-01 NOTE — Progress Notes (Signed)
  Echocardiogram 2D Echocardiogram limited has been performed.  Diamond Nickel 03/01/2012, 1:33 PM

## 2012-03-01 NOTE — Progress Notes (Signed)
CRITICAL VALUE ALERT  Critical value received:  tropnin 0.37  Date of notification:  03-01-12  Time of notification:  1000  Critical value read back:yes  Nurse who received alert:  C.Rashonda Warrior  MD notified (1st page):  Dr.Turner  Time of first page:  59  MD notified (2nd page):  Time of second page:  Responding MD:  turner  Time MD responded:  1013

## 2012-03-01 NOTE — Progress Notes (Signed)
Patient went into AFIB with RVR rate in the 100-135s, MD Paged.  At that time, patient was asleep and not showing any signs of distress, and denied chest pain/SOB.  Received orders for Amiodarone and NS @ 55ml/hr.  Around 0215 patient stated he was having substernal chest pain 5/10.  Paged CARDS again, discussed with doctor the possibility of cardioversion.  3rd PIV was placed, Amiodarone started, bolus of 150 then continuous infusion, NS at 75cc/hr, Heparin at 13units/hr.  MD gave orders to pull Fentanyl 131mcg and Versed 19mcg for sedation.  Zoll is at bedside, patient already had AED pads on.  Will notify cards once Amio bolus is in.

## 2012-03-01 NOTE — Progress Notes (Signed)
Fentanyl 189mcg and Versed 4mg  returned to pxyis

## 2012-03-01 NOTE — Progress Notes (Addendum)
SUBJECTIVE:  Now back in atrial fibrillation with controlled VR.  No further chest pain  OBJECTIVE:   Vitals:   Filed Vitals:   03/01/12 0430 03/01/12 0700 03/01/12 0716 03/01/12 0800  BP: 86/66 96/65  91/69  Pulse: 96 80  91  Temp:   98.3 F (36.8 C)   TempSrc:   Oral   Resp: 17 14  13   SpO2: 99% 99%  100%   I&O's:   Intake/Output Summary (Last 24 hours) at 03/01/12 0848 Last data filed at 03/01/12 0800  Gross per 24 hour  Intake 1053.39 ml  Output    325 ml  Net 728.39 ml   TELEMETRY: Reviewed telemetry pt in atrial fibrillation     PHYSICAL EXAM General: Well developed, well nourished, in no acute distress Head: Eyes PERRLA, No xanthomas.   Normal cephalic and atramatic  Lungs:   Clear bilaterally to auscultation and percussion. Heart:   Irregularly irregular S1 S2 Pulses are 2+ & equal. Abdomen: Bowel sounds are positive, abdomen soft and non-tender without masses  Extremities:   No clubbing, cyanosis or edema.  DP +1 Neuro: Alert and oriented X 3. Psych:  Good affect, responds appropriately   LABS: Basic Metabolic Panel:  Recent Labs  02/29/12 2255  NA 138  K 3.6  CL 106  GLUCOSE 107*  BUN 26*  CREATININE 1.30   CBC:  Recent Labs  02/29/12 2255  HGB 16.7  HCT 49.0   Coag Panel:   Lab Results  Component Value Date   INR 0.99 04/12/2010    RADIOLOGY: Dg Chest Portable 1 View  02/29/2012  *RADIOLOGY REPORT*  Clinical Data: 79 year old male with chest pain.  Hypertension.  PORTABLE CHEST - 1 VIEW  Comparison: 04/23/2010 and earlier.  Findings: Portable upright AP view 2206 hours.  Lung volumes are stable within normal limits.  Stable cardiac size and mediastinal contours.  Visualized tracheal air column is within normal limits. No pneumothorax, pulmonary edema, pleural effusion or acute pulmonary opacity.  Multiple EKG leads and wires overlie the chest.  IMPRESSION: No acute cardiopulmonary abnormality.   Original Report Authenticated By: Roselyn Reef,  M.D.       ASSESSMENT:  1.  Atrial fibrillation with RVR s/o emergent DCCV and now back in afib with CVR 2.  CAD borderline by cath 04/2010 3.  Chest pain secondary to demand ischemia for rapid afib - initial troponin negative. He has had some more episodes of chest pain overnight. 4.  HTN with borderline low BP 5.  Dyslipidemia  PLAN:   1.  NPO after midnight on Sunday 2.  Cath on Monday due to continued episodes of chest pain 3.  Continue IV Heparin gtt/ASA/statin and beta blocker 4.  Stop lasix due to low BP 5.  Continue IV Amio gtt for rate control 6.  Will need to be on longterm coumadin once cath done for CHADS VASC score of 3 7.  Stop Imdur and start IV NTG gtt as BP tolerates 8.  Stop Lasix and Benazepril due to low BP  Sueanne Margarita, MD  03/01/2012  8:48 AM

## 2012-03-01 NOTE — Progress Notes (Signed)
ANTICOAGULATION CONSULT NOTE - Initial Consult  Pharmacy Consult for heparin Indication: atrial fibrillation  Allergies  Allergen Reactions  . Bee Venom Anaphylaxis    Patient Measurements:  Heparin Dosing Weight: 105kg  Vital Signs: Temp: 97.9 F (36.6 C) (02/28 2131) Temp src: Oral (02/28 2131) BP: 82/56 mmHg (02/28 2330) Pulse Rate: 81 (02/28 2330)  Labs:  Recent Labs  02/29/12 2255  HGB 16.7  HCT 49.0  CREATININE 1.30    Medical History: Past Medical History  Diagnosis Date  . Hypertension   . Angina   . Coronary artery disease   . CAD (coronary artery disease)     Cath April 2012 40% left main, 50% LAD, 40% OM, occluded RCA with L-R collaterals   . Hypertension   . BPH (benign prostatic hypertrophy)   . Chronic venous insufficiency   . Gout   . Hyperlipidemia   . Obesity (BMI 30-39.9)     Assessment: 79yo male with h/o angina c/o CP, unrelieved by NTG x3 and ASA, found to be in Afib, pt was shocked into NSR, now to begin heparin.  Goal of Therapy:  Heparin level 0.3-0.7 units/ml Monitor platelets by anticoagulation protocol: Yes   Plan:  Heparin 4000 units IV bolus followed by gtt at 1000 units/hr begun by EDMD; will increase gtt to 1300 units/hr and monitor heparin levels and CBC.  Rogue Bussing PharmD BCPS 03/01/2012,12:18 AM

## 2012-03-01 NOTE — H&P (Signed)
Patient ID: OTHO RODELA MRN: [id number], DOB/AGE: 77-Jan-1928   Admit date: 02/29/2012   Primary Physician: No primary provider on file. Primary Cardiologist: Dr. Wynonia Lawman   Problem List  Past Medical History  Diagnosis Date  . Hypertension   . Angina   . Coronary artery disease   . CAD (coronary artery disease)     Cath April 2012 40% left main, 50% LAD, 40% OM, occluded RCA with L-R collaterals   . Hypertension   . BPH (benign prostatic hypertrophy)   . Chronic venous insufficiency   . Gout   . Hyperlipidemia   . Obesity (BMI 30-39.9)     Past Surgical History  Procedure Laterality Date  . Cardiac catheterization       Allergies  Allergies  Allergen Reactions  . Bee Venom Anaphylaxis    HPI  79 y/o male with known h/o moderate to severe CAD (treated medically) who presents with acute onset chest pain that started this evening.  He denies dyspnea/palpitations.  Was feeling well prior to this.  While in the ER he was found to be in new onsert A Fib.  Prior to initiating rate-controlling therapy, he became acutely hypotensive and diaphoretic.  His chest pain became severe.  He was sedated and emergently cardioverted.  He converted to NSR with one synchronized 200J shock.  Afterwards he states his chest pain is gone.  Home Medications  Prior to Admission medications   Medication Sig Start Date End Date Taking? Authorizing Provider  allopurinol (ZYLOPRIM) 300 MG tablet Take 150 mg by mouth Daily. 02/26/11  Yes Historical Provider, MD  aspirin 81 MG tablet Take 81 mg by mouth daily.   Yes Historical Provider, MD  atenolol (TENORMIN) 50 MG tablet Take 25 mg by mouth Daily. 02/26/11  Yes Historical Provider, MD  benazepril (LOTENSIN) 10 MG tablet Take 5 mg by mouth Daily. 02/26/11  Yes Historical Provider, MD  cholecalciferol (VITAMIN D) 1000 UNITS tablet Take 1,000 Units by mouth daily.   Yes Historical Provider, MD  clopidogrel (PLAVIX) 75 MG tablet Take 75 mg by mouth  daily.   Yes Historical Provider, MD  CRESTOR 20 MG tablet Take 20 mg by mouth Daily. 02/26/11  Yes Historical Provider, MD  cyanocobalamin (,VITAMIN B-12,) 1000 MCG/ML injection Inject 1,000 mcg into the muscle See admin instructions. Every three weeks   Yes Historical Provider, MD  EPINEPHrine (EPIPEN 2-PAK) 0.3 mg/0.3 mL DEVI Inject 0.3 mg into the muscle See admin instructions. For severe allergic reaction   Yes Historical Provider, MD  fish oil-omega-3 fatty acids 1000 MG capsule Take 1 g by mouth daily.    Yes Historical Provider, MD  furosemide (LASIX) 40 MG tablet Take 20 mg by mouth Daily. 02/26/11  Yes Historical Provider, MD  galantamine (RAZADYNE ER) 8 MG 24 hr capsule Take 8 mg by mouth Daily. 02/26/11  Yes Historical Provider, MD  isosorbide mononitrate (IMDUR) 120 MG 24 hr tablet Take 120 mg by mouth Daily. 03/26/11  Yes Historical Provider, MD  NITROSTAT 0.4 MG SL tablet Place 1 tablet under the tongue every 5 (five) minutes x 3 doses as needed for chest pain.  04/01/11  Yes Historical Provider, MD  psyllium (METAMUCIL) 58.6 % packet Take 1 packet by mouth daily.   Yes Historical Provider, MD  vitamin B-12 (CYANOCOBALAMIN) 1000 MCG tablet Take 500 mcg by mouth daily.   Yes Historical Provider, MD  ZETIA 10 MG tablet Take 10 mg by mouth Daily. 02/26/11  Yes Historical  Provider, MD    Family History  Family History  Problem Relation Age of Onset  . Dementia Mother   . Stroke Brother     Social History  Married Former smoker Drinks 2 bourbons and 1 glass of wine a night Drinks 3 cups of coffee daily No cocaine/amphetamines  Review of Systems Wife states he snores 12 point review of systems reviewed and are otherwise negative except as noted above.  Physical Exam  Blood pressure 82/56, pulse 81, temperature 97.9 F (36.6 C), temperature source Oral, resp. rate 15, SpO2 97.00%.  General: initially in severe distress; after DCCV, somnolent but arouseable and in no  distress Psych: calm Neuro: somnolent but easily arouseable (after sedation); moves all extremities HEENT: Normal  Neck: Supple without bruits or JVD. Lungs:  Resp regular and unlabored, CTA. Heart: tachy and irregular prior to DCCV; soft systolic murmur Abdomen: Soft, non-tender, non-distended, BS + x 4.  Extremities: no edema; 2+ DP/radial pulses bilaterally  Labs  Troponin negative  Lab Results  Component Value Date   WBC 4.5 04/23/2010   HGB 16.7 02/29/2012   HCT 49.0 02/29/2012   MCV 89.6 04/23/2010   PLT 215 04/23/2010    Recent Labs Lab 02/29/12 2255  NA 138  K 3.6  CL 106  BUN 26*  CREATININE 1.30  GLUCOSE 107*     Radiology/Studies  Dg Chest Portable 1 View  02/29/2012  *RADIOLOGY REPORT*  Clinical Data: 79 year old male with chest pain.  Hypertension.  PORTABLE CHEST - 1 VIEW  Comparison: 04/23/2010 and earlier.  Findings: Portable upright AP view 2206 hours.  Lung volumes are stable within normal limits.  Stable cardiac size and mediastinal contours.  Visualized tracheal air column is within normal limits. No pneumothorax, pulmonary edema, pleural effusion or acute pulmonary opacity.  Multiple EKG leads and wires overlie the chest.  IMPRESSION: No acute cardiopulmonary abnormality.   Original Report Authenticated By: Roselyn Reef, M.D.     ECG: atrial fibrillation with RVR, anterolateral upsloping ST depressions, subtle ST elevation in aVR prior to DCCV; after DCCV, NSR with no abnormalities  Last cath report and TTE report reviewed  ASSESSMENT AND PLAN  1) Atrial Fibrillation with RVR, causing demand ischemia (angina) and unstable requiring emergent DCCV: will place on UFH gtt tonight, d/c plavix (no h/o stent), continue aspirin, and plan on starting oral anticoagulation tomorrow (CHADS-VAsc = 4); will defer choice of agent to Dr. Wynonia Lawman (consider coumadin given h/o CAD vs. Apixaban).  Will admit to ICU tonight.  Will change Atenolol 25 mg daily to Toprol XL 50 mg  daily for better round the clock beta-blockade.  If recurs, consider amiodarone vs. sotalol for long-term suppression.  Check TSH.  Untreated OSA and alcohol use may be contributing.  Will check limited TTE for EF and MR eval.  2) CAD: seems stable; event tonight was likely demand-related.  I doubt a repeat cath is needed.  Will check another troponin.  Continue aspirin, statin, beta-blocker, Imdur.  Stop plavix given need for anticoagulation.  3) HTN: continue ACEI, beta-blocker  4) Gout- allopurinol  Full Code.   Ivin Booty, MD 03/01/2012, 12:18 AM

## 2012-03-02 ENCOUNTER — Encounter (HOSPITAL_COMMUNITY): Payer: Self-pay | Admitting: Thoracic Surgery (Cardiothoracic Vascular Surgery)

## 2012-03-02 ENCOUNTER — Encounter (HOSPITAL_COMMUNITY)
Admission: EM | Disposition: A | Discharge status: Home / Self Care | Payer: Self-pay | Source: Home / Self Care | Attending: Thoracic Surgery (Cardiothoracic Vascular Surgery)

## 2012-03-02 DIAGNOSIS — I2510 Atherosclerotic heart disease of native coronary artery without angina pectoris: Secondary | ICD-10-CM

## 2012-03-02 DIAGNOSIS — I4891 Unspecified atrial fibrillation: Secondary | ICD-10-CM

## 2012-03-02 DIAGNOSIS — I214 Non-ST elevation (NSTEMI) myocardial infarction: Secondary | ICD-10-CM | POA: Diagnosis present

## 2012-03-02 HISTORY — PX: LEFT HEART CATH: SHX5478

## 2012-03-02 LAB — CK TOTAL AND CKMB (NOT AT ARMC)
CK, MB: 5.9 ng/mL — ABNORMAL HIGH (ref 0.3–4.0)
CK, MB: 4.8 ng/mL — ABNORMAL HIGH (ref 0.3–4.0)
Total CK: 74 U/L (ref 7–232)

## 2012-03-02 LAB — HEPARIN LEVEL (UNFRACTIONATED)
Heparin Unfractionated: 0.76 IU/mL — ABNORMAL HIGH (ref 0.30–0.70)
Heparin Unfractionated: 0.33 IU/mL (ref 0.30–0.70)

## 2012-03-02 LAB — BASIC METABOLIC PANEL
BUN: 12 mg/dL (ref 6–23)
Calcium: 8.5 mg/dL (ref 8.4–10.5)
GFR calc non Af Amer: 66 mL/min — ABNORMAL LOW (ref >90)
Glucose, Bld: 123 mg/dL — ABNORMAL HIGH (ref 70–99)
Sodium: 137 mEq/L (ref 135–145)

## 2012-03-02 LAB — URINALYSIS, ROUTINE W REFLEX MICROSCOPIC
Ketones, ur: NEGATIVE mg/dL
Leukocytes, UA: NEGATIVE
Nitrite: NEGATIVE
Protein, ur: NEGATIVE mg/dL
Urobilinogen, UA: 0.2 mg/dL (ref 0.0–1.0)
pH: 6 (ref 5.0–8.0)

## 2012-03-02 LAB — TROPONIN I: Troponin I: <0.3 ng/mL (ref <0.30)

## 2012-03-02 LAB — PROTIME-INR: INR: 0.96 (ref 0.00–1.49)

## 2012-03-02 LAB — CBC
Hemoglobin: 14.7 g/dL (ref 13.0–17.0)
MCHC: 33.9 g/dL (ref 30.0–36.0)
WBC: 6.6 10*3/uL (ref 4.0–10.5)

## 2012-03-02 SURGERY — LEFT HEART CATH
Anesthesia: LOCAL | Laterality: Right

## 2012-03-02 MED ORDER — HEPARIN (PORCINE) IN NACL 2-0.9 UNIT/ML-% IJ SOLN
INTRAMUSCULAR | Status: AC
  Filled 2012-03-02: qty 1000

## 2012-03-02 MED ORDER — VERAPAMIL HCL 2.5 MG/ML IV SOLN
INTRAVENOUS | Status: AC
  Filled 2012-03-02: qty 2

## 2012-03-02 MED ORDER — LIDOCAINE HCL (PF) 1 % IJ SOLN
INTRAMUSCULAR | Status: AC
  Filled 2012-03-02: qty 30

## 2012-03-02 MED ORDER — ~~LOC~~ CARDIAC SURGERY, PATIENT & FAMILY EDUCATION
Freq: Once | Status: AC
  Administered 2012-03-02: 20:00:00
  Filled 2012-03-02: qty 1

## 2012-03-02 MED ORDER — SODIUM CHLORIDE 0.9 % IJ SOLN: 3.0000 mL | INTRAMUSCULAR | Status: DC | PRN

## 2012-03-02 MED ORDER — HEPARIN (PORCINE) IN NACL 100-0.45 UNIT/ML-% IJ SOLN
1300.0000 [IU]/h | INTRAMUSCULAR | Status: DC
  Administered 2012-03-02 – 2012-03-05 (×5): 1300 [IU]/h via INTRAVENOUS
  Filled 2012-03-02 (×8): qty 250

## 2012-03-02 MED ORDER — SODIUM CHLORIDE 0.9 % IV SOLN: 250.0000 mL | INTRAVENOUS | Status: DC | PRN

## 2012-03-02 MED ORDER — SODIUM CHLORIDE 0.9 % IJ SOLN: 3.0000 mL | Freq: Two times a day (BID) | INTRAMUSCULAR | Status: DC

## 2012-03-02 MED ORDER — ASPIRIN 81 MG PO CHEW
324.0000 mg | CHEWABLE_TABLET | ORAL | Status: AC
  Administered 2012-03-02: 324 mg via ORAL
  Filled 2012-03-02: qty 4

## 2012-03-02 MED ORDER — FENTANYL CITRATE 0.05 MG/ML IJ SOLN
INTRAMUSCULAR | Status: AC
  Filled 2012-03-02: qty 2

## 2012-03-02 MED ORDER — HEPARIN SODIUM (PORCINE) 1000 UNIT/ML IJ SOLN
INTRAMUSCULAR | Status: AC
  Filled 2012-03-02: qty 1

## 2012-03-02 MED ORDER — SODIUM CHLORIDE 0.9 % IV SOLN
1.0000 mL/kg/h | INTRAVENOUS | Status: AC
  Administered 2012-03-02: 1 mL/kg/h via INTRAVENOUS

## 2012-03-02 MED ORDER — MIDAZOLAM HCL 2 MG/2ML IJ SOLN
INTRAMUSCULAR | Status: AC
  Filled 2012-03-02: qty 2

## 2012-03-02 MED ORDER — NITROGLYCERIN 1 MG/10 ML FOR IR/CATH LAB
INTRA_ARTERIAL | Status: AC
  Filled 2012-03-02: qty 10

## 2012-03-02 MED ORDER — DIAZEPAM 2 MG PO TABS
2.0000 mg | ORAL_TABLET | ORAL | Status: AC
  Administered 2012-03-02: 2 mg via ORAL
  Filled 2012-03-02: qty 1

## 2012-03-02 NOTE — Consult Note (Signed)
CARDIOTHORACIC SURGERY CONSULTATION REPORT  PCP is [name], MD Referring Lexxi Koslow is Sherren Mocha, MD Primary Cardiologist is Ezzard Standing, MD   Reason for consultation:  Left main disease with 3-vessel CAD  HPI:  Patient is an 79 year old gentleman from [place] with known history of coronary artery disease, hypertension, hyperlipidemia, and borderline type 2 diabetes mellitus who has been followed for several years by Dr. Wynonia Lawman. The patient reports having had occasional episodes of substernal chest pain relieved by nitroglycerin in the past. Typically these symptoms occur perhaps once a year at most. His last previous episode occurred in 2012 after which time he underwent cardiac catheterization by Dr. Wynonia Lawman. He was found to have 40% left main coronary artery stenosis, 50% stenosis of the left anterior descending coronary artery, and chronic occlusion of the right coronary artery. He was treated medically. He has done well until 2 days ago when he developed onset of substernal chest pain occurring at rest it persisted despite administration of sublingual nitroglycerin x3. He had been quite active earlier in the day But had not noticed any particular issues until during the evening when he developed substernal chest pain. He presented to the emergency department where he was found to be in new-onset atrial fibrillation. Apparently he became acutely hypotensive and diaphoretic in the emergency department at which time he underwent prompt DC cardioversion which converted the patient back to sinus rhythm using 1 synchronized 200J shock. Following cardioversion his chest pain was relieved, and electrocardiograms were notable for the absence of acute ST segment elevation. The patient was admitted to the intensive care unit.  Subsequent cardiac enzymes were weakly positive with troponin I 0.37 and CK-MB 5.9.  The patient went back into atrial fibrillation by the following morning  and has had some mild intermittent chest discomfort associated with his atrial fibrillation, notably without any significant ST segment changes on electrocardiograms. The patient was taken for urgent diagnostic cardiac catheterization earlier today by Dr. Burt Knack.  The patient was found to have moderate left main coronary stenosis and severe three-vessel coronary artery disease with new high grade 95% stenosis of the mid left anterior descending coronary artery. Left ventricular systolic function appears preserved. Cardiothoracic surgical consultation has been requested. The patient notably had been taking Plavix prior to admission and platelet function assay performed earlier today demonstrates therapeutic Plavix activity with associated diminished platelet function.  The patient reports currently feeling well with no shortness of breath. He currently reports mild vague residual discomfort in his chest, grade 1 severity at worst. Prior to hospital admission 2 days ago the patient reports having felt well over the last few weeks with no prodromal symptoms. He reports only mild exertional shortness of breath with strenuous physical activity prior to admission. He denies resting shortness of breath, PND, orthopnea, palpitations, dizzy spells, or syncope. The patient has mild chronic bilateral lower extremity edema.  Past Medical History  Diagnosis Date  . Hypertension   . Angina   . Coronary artery disease   . CAD (coronary artery disease)     Cath April 2012 40% left main, 50% LAD, 40% OM, occluded RCA with L-R collaterals   . Hypertension   . BPH (benign prostatic hypertrophy)   . Chronic venous insufficiency   . Gout   . Hyperlipidemia   . Obesity (BMI 30-39.9)   . Glaucoma   . Gout     Past Surgical History  Procedure Laterality Date  . Cardiac  catheterization    . Prostatectomy    . Cataract extraction, bilateral      Family History  Problem Relation Age of Onset  . Dementia Mother     . Stroke Brother     History   Social History  . Marital Status: Widowed    Spouse Name: N/A    Number of Children: N/A  . Years of Education: N/A   Occupational History  . 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 Smoking status: Former Research scientist (life sciences)  . Smokeless tobacco: Never Used  . Alcohol Use: 0.6 oz/week    1 Shots of liquor per week     Comment: weekly  . Drug Use: Not on file  . Sexually Active: Yes   Other Topics Concern  . Not on file   Social History Narrative   Lives with wife in [place].     Very active physically without significant limitations.    Exercises regularly    Prior to Admission medications   Medication Sig Start Date End Date Taking? Authorizing Amirah Goerke  allopurinol (ZYLOPRIM) 300 MG tablet Take 150 mg by mouth Daily. 02/26/11  Yes Historical Laquanna Veazey, MD  aspirin 81 MG tablet Take 81 mg by mouth daily.   Yes Historical Dehlia Kilner, MD  atenolol (TENORMIN) 50 MG tablet Take 25 mg by mouth Daily. 02/26/11  Yes Historical Tirzah Fross, MD  benazepril (LOTENSIN) 10 MG tablet Take 5 mg by mouth Daily. 02/26/11  Yes Historical Dhairya Corales, MD  cholecalciferol (VITAMIN D) 1000 UNITS tablet Take 1,000 Units by mouth daily.   Yes Historical Marni Franzoni, MD  clopidogrel (PLAVIX) 75 MG tablet Take 75 mg by mouth daily.   Yes Historical Katoya Amato, MD  CRESTOR 20 MG tablet Take 20 mg by mouth Daily. 02/26/11  Yes Historical Waino Mounsey, MD  cyanocobalamin (,VITAMIN B-12,) 1000 MCG/ML injection Inject 1,000 mcg into the muscle See admin instructions. Every three weeks   Yes Historical Glendia Olshefski, MD  EPINEPHrine (EPIPEN 2-PAK) 0.3 mg/0.3 mL DEVI Inject 0.3 mg into the muscle See admin instructions. For severe allergic reaction   Yes Historical Antwuan Eckley, MD  fish oil-omega-3 fatty acids 1000 MG capsule Take 1 g by mouth daily.    Yes Historical Nakkia Mackiewicz, MD  furosemide (LASIX) 40 MG tablet Take 20 mg by mouth Daily. 02/26/11  Yes Historical Tauren Delbuono, MD  galantamine  (RAZADYNE ER) 8 MG 24 hr capsule Take 8 mg by mouth Daily. 02/26/11  Yes Historical Chayil Gantt, MD  isosorbide mononitrate (IMDUR) 120 MG 24 hr tablet Take 120 mg by mouth Daily. 03/26/11  Yes Historical Merrianne Mccumbers, MD  NITROSTAT 0.4 MG SL tablet Place 1 tablet under the tongue every 5 (five) minutes x 3 doses as needed for chest pain.  04/01/11  Yes Historical Aniyia Rane, MD  psyllium (METAMUCIL) 58.6 % packet Take 1 packet by mouth daily.   Yes Historical Christella App, MD  vitamin B-12 (CYANOCOBALAMIN) 1000 MCG tablet Take 500 mcg by mouth daily.   Yes Historical Meredyth Hornung, MD  ZETIA 10 MG tablet Take 10 mg by mouth Daily. 02/26/11  Yes Historical Jose Alleyne, MD    Current Facility-Administered Medications  Medication Dose Route Frequency Dennison Mcdaid Last Rate Last Dose  . 0.9 %  sodium chloride infusion   Intravenous Continuous Lelon Perla, MD 75 mL/hr at 03/02/12 0752 75 mL/hr at 03/02/12 0752  . 0.9 %  sodium chloride infusion  250 mL Intravenous PRN Sueanne Margarita, MD      . 0.9 %  sodium chloride infusion  1 mL/kg/hr Intravenous Continuous Sherren Mocha, MD 106.5 mL/hr at 03/02/12 1138 1 mL/kg/hr at 03/02/12 1138  . 0.9 %  sodium chloride infusion  250 mL Intravenous PRN Sherren Mocha, MD      . acetaminophen (TYLENOL) tablet 650 mg  650 mg Oral Q4H PRN Pelbreton C. Colon Flattery, MD   650 mg at 03/02/12 0801  . allopurinol (ZYLOPRIM) tablet 150 mg  150 mg Oral Daily Arlina Robes, MD   150 mg at 03/02/12 1138  . amiodarone (NEXTERONE PREMIX) 360 mg/200 mL dextrose IV infusion  0.5 mg/min Intravenous Continuous Lelon Perla, MD 16.7 mL/hr at 03/02/12 1242 0.5 mg/min at 03/02/12 1242  . aspirin chewable tablet 81 mg  81 mg Oral Daily Arlina Robes, MD   81 mg at 03/01/12 [id number]  . atorvastatin (LIPITOR) tablet 40 mg  40 mg Oral q1800 Arlina Robes, MD   40 mg at 03/01/12 1855  . ezetimibe (ZETIA) tablet 10 mg  10 mg Oral Daily Arlina Robes, MD   10 mg at 03/02/12 1142  . galantamine (RAZADYNE) tablet 4 mg  4 mg  Oral BID WC Arlina Robes, MD   4 mg at 03/02/12 1143  . heparin ADULT infusion 100 units/mL (25000 units/250 mL)  1,300 Units/hr Intravenous Continuous Arty Baumgartner, [facility] 13 mL/hr at 03/02/12 1515 1,300 Units/hr at 03/02/12 1515  . metoprolol succinate (TOPROL-XL) 24 hr tablet 50 mg  50 mg Oral Daily Arlina Robes, MD   50 mg at 03/02/12 1142  . nitroGLYCERIN 0.2 mg/mL in dextrose 5 % infusion  2-200 mcg/min Intravenous Titrated Sueanne Margarita, MD 12 mL/hr at 03/02/12 1400 40 mcg/min at 03/02/12 1400  . ondansetron (ZOFRAN) injection 4 mg  4 mg Intravenous Q6H PRN Pelbreton C. Balfour, MD      . sodium chloride 0.9 % injection 3 mL  3 mL Intravenous Q12H Arlina Robes, MD   3 mL at 03/01/12 0948  . sodium chloride 0.9 % injection 3 mL  3 mL Intravenous Q12H Traci R Turner, MD      . sodium chloride 0.9 % injection 3 mL  3 mL Intravenous PRN Sueanne Margarita, MD      . sodium chloride 0.9 % injection 3 mL  3 mL Intravenous Q12H Sherren Mocha, MD      . sodium chloride 0.9 % injection 3 mL  3 mL Intravenous PRN Sherren Mocha, MD        Allergies  Allergen Reactions  . Bee Venom Anaphylaxis      Review of Systems:   General:  normal appetite, normal energy, no weight gain, no weight loss, no fever  Cardiac:  no chest pain with exertion, + chest pain at rest, mild SOB with strenuous exertion, no resting SOB, no PND, no orthopnea, no palpitations, + arrhythmia, + atrial fibrillation, + LE edema, no dizzy spells, no syncope  Respiratory:  no shortness of breath, no home oxygen, no productive cough, no dry cough, no bronchitis, no wheezing, no hemoptysis, no asthma, no pain with inspiration or cough, no sleep apnea, no CPAP at night  GI:   no difficulty swallowing, occasional reflux, no frequent heartburn, no hiatal hernia, no abdominal pain, no constipation, no diarrhea, no hematochezia, no hematemesis, no melena  GU:   no dysuria,  no frequency, no urinary tract infection, no hematuria, +  enlarged prostate, no kidney stones, no kidney disease  Vascular:  no pain suggestive of claudication, no pain in feet, no  leg cramps, no varicose veins, no DVT, no non-healing foot ulcer  Neuro:   no stroke, no TIA's, no seizures, no headaches, no temporary blindness one eye,  no slurred speech, no peripheral neuropathy, no chronic pain, no instability of gait, no memory/cognitive dysfunction  Musculoskeletal: no arthritis, no joint swelling, no myalgias, no difficulty walking, normal mobility   Skin:   no rash, no itching, no skin infections, no pressure sores or ulcerations  Psych:   no anxiety, no depression, no nervousness, no unusual recent stress  Eyes:   no blurry vision, no floaters, no recent vision changes, does not wears glasses or contacts  ENT:   no hearing loss, no loose or painful teeth, no dentures, last saw dentist recently  Hematologic:  no easy bruising, no abnormal bleeding, no clotting disorder, no frequent epistaxis  Endocrine:  "Borderline" diabetes, does not check CBG's at home     Physical Exam:   BP 116/71  Pulse 72  Temp(Src) 97.3 F (36.3 C) (Oral)  Resp 16  Ht 6\' 2"  (1.88 m)  Wt 106.5 kg (234 lb 12.6 oz)  BMI 30.13 kg/m2  SpO2 95%  General:  Mildly obese,  well-appearing  HEENT:  Unremarkable   Neck:   no JVD, no bruits, no adenopathy   Chest:   clear to auscultation, symmetrical breath sounds, no wheezes, no rhonchi   CV:   Irregular rate and rhythm, no murmur   Abdomen:  soft, non-tender, no masses   Extremities:  warm, well-perfused, pulses mildly diminished, mild LE edema  Rectal/GU  Deferred  Neuro:   Grossly non-focal and symmetrical throughout  Skin:   Clean and dry, no rashes, no breakdown  Diagnostic Tests:  Results for AARAN, RUNDE (MRN [id number]) as of 03/02/2012 16:02  Ref. Range 03/02/2012 08:58  Platelet Function  P2Y12 Latest Range: 194-418 PRU 85 (L)    Cardiac Catheterization Procedure Note  Name: MAISYN ABRUZZESE  MRN: [id number]    DOB: January 05, 1926  Procedure: Left Heart Cath, Selective Coronary Angiography, LV angiography  Indication: NSTEMI with ongoing CP despite maximal med Rx with heparin and IV NTG  Procedural Details: The right wrist was prepped, draped, and anesthetized with 1% lidocaine. Using the modified Seldinger technique, a 5 French sheath was introduced into the right radial artery. 3 mg of verapamil was administered through the sheath, weight-based unfractionated heparin was administered intravenously. Standard Judkins catheters were used for selective coronary angiography and left ventriculography. Catheter exchanges were performed over an exchange length guidewire. There were no immediate procedural complications. A TR band was used for radial hemostasis at the completion of the procedure. The patient was transferred to the post catheterization recovery area for further monitoring.  Procedural Findings:  Hemodynamics:  AO 92/59 mean 73  LV 108/12  Coronary angiography:  Coronary dominance: right  Left mainstem: The left mainstem is heavily calcified. The ostial left mainstem has mild nonobstructive stenosis. The distal left mainstem has a least 50% to 70% stenosis appreciated best in the steep RAO cranial view  Left anterior descending (LAD): The LAD is severely calcified. The proximal vessel has diffuse nonobstructive stenosis. The mid vessel has critical 95% stenosis with associated hypodensity and marked irregularity. The distal vessel has mild diffuse stenosis. The first diagonal is patent with mild nonobstructive stenosis. The second diagonal is small with a tight ostial stenosis of 75-80%.  Left circumflex (LCx): The left circumflex is very large in caliber. The proximal vessel was calcified. There is nonobstructive disease into  the first OM branch which is a large branch vessel. Just beyond the first OM and the AV circumflex there is hypodensity in 50-70% stenosis. The circumflex extends down the AV groove  and supplies 2 more obtuse marginal branches with diffuse nonobstructive disease.  Right coronary artery (RCA): The RCA is totally occluded in the proximal aspect. There is a bridging collateral with antegrade flow into the mid and distal RCA. The distal RCA cell branches also fill from left to right collaterals. The appearance of the right coronary artery is not significantly changed from prior CAT studies.  Left ventriculography: Left ventricular systolic function is normal, LVEF is estimated at 55-65%, there is mild mitral regurgitation  Final Conclusions:  1. Severe three-vessel coronary artery disease with moderately severe distal left main stenosis, critical mid LAD stenosis, moderate left circumflex stenosis, and total occlusion of the right coronary artery  2. Preserved left ventricular systolic function with normal left ventricular end-diastolic pressure  Recommendations: The patient has severe multivessel coronary disease with at least moderate left main disease. I have compared his cath films to previous study and he clearly has had a plaque rupture with marked progression of stenosis in the mid LAD. I think this likely accounts for the recent change in his anginal symptoms as well as his resting chest discomfort on his current presentation. He has been on long-term Plavix and his PRU of 85 suggests highly inhibited platelet function today. I would recommend a cardiac surgery evaluation for consideration of urgent CABG. It may be best to continue medical therapy for a few days if possible to allow plavix washout and reduce perioperative bleeding risk. At present he is pain-free on IV NTG. Will resume heparin in 2 hours.  Sherren Mocha  03/02/2012, 10:43 AM     Impression:  The patient has left main disease with severe three-vessel coronary artery disease associated with unstable angina in the setting of new onset persistent atrial fibrillation and weakly positive cardiac enzymes associated  with the patient's acute presentation Friday evening. Left ventricular systolic function appears reasonably well preserved on diagnostic cardiac catheterization. I agree that the patient would best be treated with surgical revascularization. Risks associated with surgery will be slightly elevated because of the patient's advanced age, but overall the patient appears to be reasonably good candidate for surgery. It might be reasonable to consider concomitant Maze procedure for treatment of atrial fibrillation. The patient was on Plavix prior to admission and remains therapeutic at this time with diminished platelet function. As long as the patient remains clinically stable it seems wise to postpone surgery for several days to allow the effects of Plavix to dissipate.   Plan:  I've discussed matters at length with the patient this afternoon. Alternative treatment strategies been discussed. The patient understands and accepts all potential associated risks of surgery including but not limited to risk of death, stroke, myocardial infarction, congestive heart failure, respiratory failure, pneumonia, bleeding requiring blood transfusion, arrhythmia, infection, late recurrence of atrial fibrillation or other arrhythmias, late recurrence of symptomatic coronary artery disease. All of his questions been addressed. We will tentatively plan to proceed with surgery on Thursday, March 6 assuming that the patient remains clinically stable during the interim period of time.    Valentina Gu. Roxy Manns, MD 03/02/2012 4:04 PM  I spent in excess of 90 minutes of time directly involved in the conduct of this consultation.

## 2012-03-02 NOTE — Progress Notes (Signed)
ANTICOAGULATION CONSULT NOTE - Follow Up Consult  Pharmacy Consult for Heparin Indication: atrial fibrillation/ NSTEMI, 3V CAD  Allergies  Allergen Reactions  . Bee Venom Anaphylaxis    Patient Measurements: Height: 6\' 2"  (188 cm) Weight: 234 lb 12.6 oz (106.5 kg) IBW/kg (Calculated) : 82.2 Heparin Dosing Weight: 104 kg  Vital Signs: Temp: 97.6 F (36.4 C) (03/02 2000) Temp src: Oral (03/02 2000) BP: 136/79 mmHg (03/02 2100) Pulse Rate: 83 (03/02 2100)  Labs:  Recent Labs  02/29/12 2255 03/01/12 0904 03/02/12 0530 03/02/12 0858 03/02/12 1703 03/02/12 2048  HGB 16.7 15.8 14.7  --   --   --   HCT 49.0 45.9 43.4  --   --   --   PLT  --  139* 132*  --   --   --   LABPROT  --  13.1  --  12.7  --   --   INR  --  1.00  --  0.96  --   --   HEPARINUNFRC  --  0.41 0.76*  --   --  0.33  CREATININE 1.30 1.12  --  1.02  --   --   CKTOTAL  --   --   --  74 60  --   CKMB  --   --   --  5.9* 4.8*  --   TROPONINI  --  0.37*  --  <0.30  --   --     Estimated Creatinine Clearance: 71.3 ml/min (by C-G formula based on Cr of 1.02).  Assessment: 46 yom now therapeutic on heparin (0.33).   Goal of Therapy:  Heparin level 0.3-0.7 units/ml Monitor platelets by anticoagulation protocol: Yes   Plan:  Continue heparin at 1300 units/hr and f/u AM heparin level  Thank you,  Francesca Jewett, PharmD, BCPS 03/02/2012 9:50 PM

## 2012-03-02 NOTE — H&P (View-Only) (Signed)
SUBJECTIVE:  Had intermittent CP throughtout the night  OBJECTIVE:   Vitals:   Filed Vitals:   03/02/12 0500 03/02/12 0600 03/02/12 0700 03/02/12 0800  BP:    109/71  Pulse: 83 98 85 52  Temp:      TempSrc:      Resp: 9 12 12 18   Height:      Weight:      SpO2: 96% 97% 98% 98%   I&O's:   Intake/Output Summary (Last 24 hours) at 03/02/12 [id number] Last data filed at 03/02/12 0800  Gross per 24 hour  Intake 3250.93 ml  Output   1325 ml  Net 1925.93 ml   TELEMETRY: Reviewed telemetry pt in atrial fibrillation rate controlled     PHYSICAL EXAM General: Well developed, well nourished, in no acute distress Head: Eyes PERRLA, No xanthomas.   Normal cephalic and atramatic  Lungs:   Clear bilaterally to auscultation and percussion. Heart:   HRRR S1 S2 Pulses are 2+ & equal. Abdomen: Bowel sounds are positive, abdomen soft and non-tender without masses Extremities:   No clubbing, cyanosis or edema.  DP +1 Neuro: Alert and oriented X 3. Psych:  Good affect, responds appropriately   LABS: Basic Metabolic Panel:  Recent Labs  02/29/12 2255 03/01/12 0904  NA 138 136  K 3.6 3.8  CL 106 102  CO2  --  26  GLUCOSE 107* 113*  BUN 26* 20  CREATININE 1.30 1.12  CALCIUM  --  8.6   Liver Function Tests:  Recent Labs  03/01/12 0904  AST 19  ALT 15  ALKPHOS 37*  BILITOT 0.3  PROT 5.9*  ALBUMIN 2.9*   No results found for this basename: LIPASE, AMYLASE,  in the last 72 hours CBC:  Recent Labs  03/01/12 0904 03/02/12 0530  WBC 6.5 6.6  HGB 15.8 14.7  HCT 45.9 43.4  MCV 92.0 91.6  PLT 139* 132*   Cardiac Enzymes:  Recent Labs  03/01/12 0904  TROPONINI 0.37*   Thyroid Function Tests:  Recent Labs  03/01/12 0904  TSH 2.183   Anemia Panel: No results found for this basename: VITAMINB12, FOLATE, FERRITIN, TIBC, IRON, RETICCTPCT,  in the last 72 hours Coag Panel:   Lab Results  Component Value Date   INR 1.00 03/01/2012   INR 0.99 04/12/2010    RADIOLOGY:  Dg Chest Portable 1 View  02/29/2012  *RADIOLOGY REPORT*  Clinical Data: 79 year old male with chest pain.  Hypertension.  PORTABLE CHEST - 1 VIEW  Comparison: 04/23/2010 and earlier.  Findings: Portable upright AP view 2206 hours.  Lung volumes are stable within normal limits.  Stable cardiac size and mediastinal contours.  Visualized tracheal air column is within normal limits. No pneumothorax, pulmonary edema, pleural effusion or acute pulmonary opacity.  Multiple EKG leads and wires overlie the chest.  IMPRESSION: No acute cardiopulmonary abnormality.   Original Report Authenticated By: Roselyn Reef, M.D.    ASSESSMENT:  1. Atrial fibrillation with RVR s/o emergent DCCV and now back in afib with CVR  2. CAD occluded RCA with left to right collaterals and borderline disease elsewhere by cath 04/2010  3. Chest pain secondary to demand ischemia for rapid afib - now with elevated troponin c/w NSTEMI. He has had some more episodes of chest pain overnight and currently has 2/10 CP. 4. HTN  5. Dyslipidemia   PLAN:  1.  Discussed with Dr. Burt Knack - he is having ongoing CP despite IV NTG and Heparin now with positive  troponin.  He remains in atrial fibrillation but HR is controlled.  Will plan heart cath this am to evaluate coronary anatomy. 2.  Continue IV amiodarone for rate control. 3.  Continue IV Heparin gtt and IV NTG/ASA 4.  Will ultimately need to be on long-term anticoagulation for afib  Sueanne Margarita, MD  03/02/2012  8:38 AM

## 2012-03-02 NOTE — Interval H&P Note (Signed)
History and Physical Interval Note:  03/02/2012 10:36 AM  [name] [name]  has presented today for surgery, with the diagnosis of N-Stemi  The various methods of treatment have been discussed with the patient and family. After consideration of risks, benefits and other options for treatment, the patient has consented to  Procedure(s): LEFT HEART CATH (Right) as a surgical intervention .  The patient's history has been reviewed, patient examined, no change in status, stable for surgery.  I have reviewed the patient's chart and labs.  Questions were answered to the patient's satisfaction.    I have reviewed the patient's previous cardiac catheterization films from 2012 and 2013. I have discussed the clinical indication, risks, and alternatives to catheterization and possible PCI with both the patient and his wife. He's had progressive angina, now with rest pain in the setting of atrial fibrillation. Even with his heart rate controlled, he continues to have mild chest discomfort and I agree with Dr Radford Pax that urgent cardiac catheterization is appropriate.   Sherren Mocha

## 2012-03-02 NOTE — Progress Notes (Signed)
TR band removed per protocol. Dry dressing applied. Jory Sims PA made aware of small amount of blood at cath site. Orders recieved. Pharmacy made aware. Will monitor

## 2012-03-02 NOTE — Progress Notes (Signed)
Bleeding noted at cath site. 6cc currently in TR band replaced 3cc air inserted into TR band for total of 9cc. No additional bleeding noted. Will monitor site

## 2012-03-02 NOTE — Progress Notes (Signed)
SUBJECTIVE:  Had intermittent CP throughtout the night  OBJECTIVE:   Vitals:   Filed Vitals:   03/02/12 0500 03/02/12 0600 03/02/12 0700 03/02/12 0800  BP:    109/71  Pulse: 83 98 85 52  Temp:      TempSrc:      Resp: 9 12 12 18   Height:      Weight:      SpO2: 96% 97% 98% 98%   I&O's:   Intake/Output Summary (Last 24 hours) at 03/02/12 [id number] Last data filed at 03/02/12 0800  Gross per 24 hour  Intake 3250.93 ml  Output   1325 ml  Net 1925.93 ml   TELEMETRY: Reviewed telemetry pt in atrial fibrillation rate controlled     PHYSICAL EXAM General: Well developed, well nourished, in no acute distress Head: Eyes PERRLA, No xanthomas.   Normal cephalic and atramatic  Lungs:   Clear bilaterally to auscultation and percussion. Heart:   HRRR S1 S2 Pulses are 2+ & equal. Abdomen: Bowel sounds are positive, abdomen soft and non-tender without masses Extremities:   No clubbing, cyanosis or edema.  DP +1 Neuro: Alert and oriented X 3. Psych:  Good affect, responds appropriately   LABS: Basic Metabolic Panel:  Recent Labs  02/29/12 2255 03/01/12 0904  NA 138 136  K 3.6 3.8  CL 106 102  CO2  --  26  GLUCOSE 107* 113*  BUN 26* 20  CREATININE 1.30 1.12  CALCIUM  --  8.6   Liver Function Tests:  Recent Labs  03/01/12 0904  AST 19  ALT 15  ALKPHOS 37*  BILITOT 0.3  PROT 5.9*  ALBUMIN 2.9*   No results found for this basename: LIPASE, AMYLASE,  in the last 72 hours CBC:  Recent Labs  03/01/12 0904 03/02/12 0530  WBC 6.5 6.6  HGB 15.8 14.7  HCT 45.9 43.4  MCV 92.0 91.6  PLT 139* 132*   Cardiac Enzymes:  Recent Labs  03/01/12 0904  TROPONINI 0.37*   Thyroid Function Tests:  Recent Labs  03/01/12 0904  TSH 2.183   Anemia Panel: No results found for this basename: VITAMINB12, FOLATE, FERRITIN, TIBC, IRON, RETICCTPCT,  in the last 72 hours Coag Panel:   Lab Results  Component Value Date   INR 1.00 03/01/2012   INR 0.99 04/12/2010    RADIOLOGY:  Dg Chest Portable 1 View  02/29/2012  *RADIOLOGY REPORT*  Clinical Data: 79 year old male with chest pain.  Hypertension.  PORTABLE CHEST - 1 VIEW  Comparison: 04/23/2010 and earlier.  Findings: Portable upright AP view 2206 hours.  Lung volumes are stable within normal limits.  Stable cardiac size and mediastinal contours.  Visualized tracheal air column is within normal limits. No pneumothorax, pulmonary edema, pleural effusion or acute pulmonary opacity.  Multiple EKG leads and wires overlie the chest.  IMPRESSION: No acute cardiopulmonary abnormality.   Original Report Authenticated By: Roselyn Reef, M.D.    ASSESSMENT:  1. Atrial fibrillation with RVR s/o emergent DCCV and now back in afib with CVR  2. CAD occluded RCA with left to right collaterals and borderline disease elsewhere by cath 04/2010  3. Chest pain secondary to demand ischemia for rapid afib - now with elevated troponin c/w NSTEMI. He has had some more episodes of chest pain overnight and currently has 2/10 CP. 4. HTN  5. Dyslipidemia   PLAN:  1.  Discussed with Dr. Burt Knack - he is having ongoing CP despite IV NTG and Heparin now with positive  troponin.  He remains in atrial fibrillation but HR is controlled.  Will plan heart cath this am to evaluate coronary anatomy. 2.  Continue IV amiodarone for rate control. 3.  Continue IV Heparin gtt and IV NTG/ASA 4.  Will ultimately need to be on long-term anticoagulation for afib  Sueanne Margarita, MD  03/02/2012  8:38 AM

## 2012-03-02 NOTE — CV Procedure (Signed)
   Cardiac Catheterization Procedure Note  Name: [name] [name] MRN: [id number] DOB: [birth year]  Procedure: Left Heart Cath, Selective Coronary Angiography, LV angiography  Indication: NSTEMI with ongoing CP despite maximal med Rx with heparin and IV NTG   Procedural Details: The right wrist was prepped, draped, and anesthetized with 1% lidocaine. Using the modified Seldinger technique, a 5 French sheath was introduced into the right radial artery. 3 mg of verapamil was administered through the sheath, weight-based unfractionated heparin was administered intravenously. Standard Judkins catheters were used for selective coronary angiography and left ventriculography. Catheter exchanges were performed over an exchange length guidewire. There were no immediate procedural complications. A TR band was used for radial hemostasis at the completion of the procedure.  The patient was transferred to the post catheterization recovery area for further monitoring.  Procedural Findings: Hemodynamics: AO 92/59 mean 73 LV 108/12  Coronary angiography: Coronary dominance: right  Left mainstem: The left mainstem is heavily calcified. The ostial left mainstem has mild nonobstructive stenosis. The distal left mainstem has a least 50% to 70% stenosis appreciated best in the steep RAO cranial view   Left anterior descending (LAD): The LAD is severely calcified. The proximal vessel has diffuse nonobstructive stenosis. The mid vessel has critical 95% stenosis with associated hypodensity and marked irregularity. The distal vessel has mild diffuse stenosis. The first diagonal is patent with mild nonobstructive stenosis. The second diagonal is small with a tight ostial stenosis of 75-80%.  Left circumflex (LCx): The left circumflex is very large in caliber. The proximal vessel was calcified. There is nonobstructive disease into the first OM branch which is a large branch vessel. Just beyond the first OM and the AV  circumflex there is hypodensity in 50-70% stenosis. The circumflex extends down the AV groove and supplies 2 more obtuse marginal branches with diffuse nonobstructive disease.  Right coronary artery (RCA): The RCA is totally occluded in the proximal aspect. There is a bridging collateral with antegrade flow into the mid and distal RCA. The distal RCA cell branches also fill from left to right collaterals. The appearance of the right coronary artery is not significantly changed from prior CAT studies.  Left ventriculography: Left ventricular systolic function is normal, LVEF is estimated at 55-65%, there is mild mitral regurgitation   Final Conclusions:   1. Severe three-vessel coronary artery disease with moderately severe distal left main stenosis, critical mid LAD stenosis, moderate left circumflex stenosis, and total occlusion of the right coronary artery  2. Preserved left ventricular systolic function with normal left ventricular end-diastolic pressure  Recommendations: The patient has severe multivessel coronary disease with at least moderate left main disease. I have compared his cath films to previous study and he clearly has had a plaque rupture with marked progression of stenosis in the mid LAD. I think this likely accounts for the recent change in his anginal symptoms as well as his resting chest discomfort on his current presentation. He has been on long-term Plavix and his PRU of 85 suggests highly inhibited platelet function today. I would recommend a cardiac surgery evaluation for consideration of urgent CABG. It may be best to continue medical therapy for a few days if possible to allow plavix washout and reduce perioperative bleeding risk. At present he is pain-free on IV NTG. Will resume heparin in 2 hours.  Sherren Mocha 03/02/2012, 10:43 AM

## 2012-03-02 NOTE — Progress Notes (Addendum)
ANTICOAGULATION CONSULT NOTE - Follow Up Consult  Pharmacy Consult for Heparin Indication: atrial fibrillation/ NSTEMI, 3V CAD  Allergies  Allergen Reactions  . Bee Venom Anaphylaxis    Patient Measurements: Height: 6\' 2"  (188 cm) Weight: 234 lb 12.6 oz (106.5 kg) IBW/kg (Calculated) : 82.2 Heparin Dosing Weight: 104 kg  Vital Signs: Temp: 97.6 F (36.4 C) (03/02 0800) Temp src: Oral (03/02 0400) BP: 102/84 mmHg (03/02 0900) Pulse Rate: 80 (03/02 0948)  Labs:  Recent Labs  02/29/12 2255 03/01/12 0904 03/02/12 0530 03/02/12 0858  HGB 16.7 15.8 14.7  --   HCT 49.0 45.9 43.4  --   PLT  --  139* 132*  --   LABPROT  --  13.1  --  12.7  INR  --  1.00  --  0.96  HEPARINUNFRC  --  0.41 0.76*  --   CREATININE 1.30 1.12  --  1.02  CKTOTAL  --   --   --  74  CKMB  --   --   --  5.9*  TROPONINI  --  0.37*  --  <0.30    Estimated Creatinine Clearance: 71.3 ml/min (by C-G formula based on Cr of 1.02).  Assessment:   Heparin level just above therapeutic range on 1400 units/hr. Baseline platelet count 139, 132 today. H/H ok. Now s/p cardiac cath. For CABG consideration.  Last Plavix dose 2/28 at home.  Platelets inhbited, PRU 85.   Heparin drip to resume 2 hrs after sheath out. Out at 10:33. Radial cath.  Goal of Therapy:  Heparin level 0.3-0.7 units/ml Monitor platelets by anticoagulation protocol: Yes   Plan:   Resume heparin drip at 1300 units/hr at 12:30pm.  Heparin level ~6hrs after drip resume.  Continue daily heparin level and CBC.  Will follow up plans.  Arty Baumgartner, [place] Pager: [phone] 03/02/2012,11:28 AM  Addendum:   Had oozing from cath site earlier, heparin start delayed until resolved. No further oozing. Heparin running for ~2.5 hrs.  Heparin level planned for 9pm.  Consuello Masse, RPh 3/2 5:45pm

## 2012-03-02 NOTE — Progress Notes (Signed)
Pt to cath lab via bed. Valium and ASA 324 mg given on call. Heparin off on call. Wife sent to short stay waiting room. VSS. Pt to cath lab table. RN at bedside.

## 2012-03-03 ENCOUNTER — Other Ambulatory Visit: Payer: Self-pay | Admitting: *Deleted

## 2012-03-03 ENCOUNTER — Encounter (HOSPITAL_COMMUNITY)
Admission: EM | Disposition: A | Discharge status: Home / Self Care | Payer: Self-pay | Source: Home / Self Care | Attending: Thoracic Surgery (Cardiothoracic Vascular Surgery)

## 2012-03-03 ENCOUNTER — Inpatient Hospital Stay (HOSPITAL_COMMUNITY): Payer: Medicare Other

## 2012-03-03 DIAGNOSIS — I2510 Atherosclerotic heart disease of native coronary artery without angina pectoris: Secondary | ICD-10-CM

## 2012-03-03 DIAGNOSIS — Z01810 Encounter for preprocedural cardiovascular examination: Secondary | ICD-10-CM

## 2012-03-03 DIAGNOSIS — I5022 Chronic systolic (congestive) heart failure: Secondary | ICD-10-CM

## 2012-03-03 DIAGNOSIS — I5021 Acute systolic (congestive) heart failure: Secondary | ICD-10-CM

## 2012-03-03 HISTORY — DX: Chronic systolic (congestive) heart failure: I50.22

## 2012-03-03 HISTORY — DX: Acute systolic (congestive) heart failure: I50.21

## 2012-03-03 LAB — BLOOD GAS, ARTERIAL
Acid-Base Excess: 0.1 mmol/L (ref 0.0–2.0)
Bicarbonate: 24.2 mEq/L — ABNORMAL HIGH (ref 20.0–24.0)
O2 Saturation: 97.1 %
Patient temperature: 98.6
TCO2: 25.5 mmol/L (ref 0–100)
pH, Arterial: 7.403 (ref 7.350–7.450)

## 2012-03-03 LAB — COMPREHENSIVE METABOLIC PANEL
ALT: 13 U/L (ref 0–53)
AST: 17 U/L (ref 0–37)
Alkaline Phosphatase: 36 U/L — ABNORMAL LOW (ref 39–117)
CO2: 24 mEq/L (ref 19–32)
GFR calc Af Amer: 77 mL/min — ABNORMAL LOW (ref >90)
Glucose, Bld: 199 mg/dL — ABNORMAL HIGH (ref 70–99)
Potassium: 3.9 mEq/L (ref 3.5–5.1)
Sodium: 136 mEq/L (ref 135–145)
Total Protein: 5.4 g/dL — ABNORMAL LOW (ref 6.0–8.3)

## 2012-03-03 LAB — TSH: TSH: 3.396 u[IU]/mL (ref 0.350–4.500)

## 2012-03-03 LAB — CK TOTAL AND CKMB (NOT AT ARMC): Relative Index: INVALID (ref 0.0–2.5)

## 2012-03-03 LAB — LIPID PANEL
Cholesterol: 84 mg/dL (ref 0–200)
Total CHOL/HDL Ratio: 2.1 RATIO
Triglycerides: 75 mg/dL (ref <150)
VLDL: 15 mg/dL (ref 0–40)

## 2012-03-03 LAB — CBC
HCT: 42.1 % (ref 39.0–52.0)
Hemoglobin: 14.1 g/dL (ref 13.0–17.0)
MCH: 30.8 pg (ref 26.0–34.0)
MCHC: 33.5 g/dL (ref 30.0–36.0)
MCV: 91.9 fL (ref 78.0–100.0)
RDW: 14.4 % (ref 11.5–15.5)

## 2012-03-03 LAB — HEMOGLOBIN A1C: Mean Plasma Glucose: 128 mg/dL — ABNORMAL HIGH (ref <117)

## 2012-03-03 LAB — PROTIME-INR
INR: 0.98 (ref 0.00–1.49)
Prothrombin Time: 12.9 seconds (ref 11.6–15.2)

## 2012-03-03 LAB — HEPARIN LEVEL (UNFRACTIONATED): Heparin Unfractionated: 0.37 IU/mL (ref 0.30–0.70)

## 2012-03-03 LAB — TYPE AND SCREEN
ABO/RH(D): O POS
Antibody Screen: NEGATIVE

## 2012-03-03 LAB — APTT: aPTT: 79 seconds — ABNORMAL HIGH (ref 24–37)

## 2012-03-03 SURGERY — LEFT HEART CATH
Anesthesia: Moderate Sedation | Laterality: Bilateral

## 2012-03-03 MED ORDER — PSYLLIUM 95 % PO PACK
1.0000 | PACK | Freq: Every day | ORAL | Status: DC
  Administered 2012-03-04 – 2012-03-05 (×2): 1 via ORAL
  Filled 2012-03-03 (×4): qty 1

## 2012-03-03 MED ORDER — ALBUTEROL SULFATE (5 MG/ML) 0.5% IN NEBU
2.5000 mg | INHALATION_SOLUTION | Freq: Once | RESPIRATORY_TRACT | Status: AC
  Administered 2012-03-03: 2.5 mg via RESPIRATORY_TRACT

## 2012-03-03 NOTE — Progress Notes (Signed)
PFT done at bedside. Unconfirmed copy placed in shadow chart.  Philipp Deputy RRT, RCP 03/03/2012 4:11 PM

## 2012-03-03 NOTE — Progress Notes (Signed)
Utilization Review Completed. 03/03/2012

## 2012-03-03 NOTE — Progress Notes (Signed)
   CARDIOTHORACIC SURGERY PROGRESS NOTE  1 Day Post-Op  S/P Procedure(s) (LRB): LEFT HEART CATH (Right)  Subjective: Feels well.  Converted back to NSR overnight and no chest discomfort since.  No SOB  Objective: Vital signs in last 24 hours: Temp:  [97.3 F (36.3 C)-98.2 F (36.8 C)] 97.6 F (36.4 C) (03/03 0714) Pulse Rate:  [42-110] 63 (03/03 0800) Cardiac Rhythm:  [-] Atrial fibrillation (03/03 0800) Resp:  [13-23] 13 (03/03 0800) BP: (95-149)/(59-101) 129/72 mmHg (03/03 0800) SpO2:  [94 %-99 %] 97 % (03/03 0800) Weight:  [107.4 kg (236 lb 12.4 oz)] 107.4 kg (236 lb 12.4 oz) (03/03 0500)  Physical Exam:  Rhythm:   sinus  Breath sounds: clear  Heart sounds:  RRR  Incisions:  n/a  Abdomen:  soft  Extremities:  warm   Intake/Output from previous day: 03/02 0701 - 03/03 0700 In: 2870.8 [I.V.:2870.8] Out: 1800 [Urine:1800] Intake/Output this shift: Total I/O In: 355.2 [P.O.:240; I.V.:115.2] Out: -   Lab Results:  Recent Labs  03/02/12 0530 03/03/12 0751  WBC 6.6 6.4  HGB 14.7 14.1  HCT 43.4 42.1  PLT 132* 122*   BMET:  Recent Labs  03/01/12 0904 03/02/12 0858  NA 136 137  K 3.8 3.8  CL 102 105  CO2 26 25  GLUCOSE 113* 123*  BUN 20 12  CREATININE 1.12 1.02  CALCIUM 8.6 8.5    Results for BRIGGSTON, STIGGERS (MRN [id number]) as of 03/03/2012 08:58  Ref. Range 03/02/2012 08:58 03/02/2012 17:03  CK, MB Latest Range: 0.3-4.0 ng/mL 5.9 (H) 4.8 (H)  CK Total Latest Range: 7-232 U/L 74 60  Troponin I Latest Range: <0.30 ng/mL <0.30     CBG (last 3)   Recent Labs  02/29/12 2130  GLUCAP 108*   PT/INR:   Recent Labs  03/02/12 0858  LABPROT 12.7  INR 0.96    CXR:  *RADIOLOGY REPORT*  Clinical Data: Left heart catheterization, possible MI  PORTABLE CHEST - 1 VIEW  Comparison: Portable chest x-ray of 02/29/2012  Findings: No active infiltrate or effusion is seen. The lungs  appears slightly hyperaerated. Cardiomegaly is stable. No  skeletal  abnormality is noted other than degenerative change of the  thoracic spine.  IMPRESSION:  Stable chest x-ray. No active lung disease.  Original Report Authenticated By: Ivar Drape, M.D.   Assessment/Plan: S/P Procedure(s) (LRB): LEFT HEART CATH (Right)  Left main disease w/ 3-vessel CAD and preserved LV systolic function S/P non-STEMI, pain-free on IV heparin and NTG Recurrent paroxysmal atrial fibrillation, now NSR on amiodarone  I have again reviewed the indications, risks and potential benefits of surgery with Mr Buckert and with his wife this morning.  We spent in excess of 20 minutes face-to-face time.  All questions answered.  For OR Thursday.  OWEN,CLARENCE H 03/03/2012 8:57 AM

## 2012-03-03 NOTE — Progress Notes (Signed)
Pre-op Cardiac Surgery  Carotid Findings:  There is no obvious evidence of hemodynamically significant internal carotid artery stenosis bilaterally. Vertebral arteries are patent with antegrade flow. The right vertebral artery demonstrates an atypical waveform. Right BP= 133/78, left BP=135/85.   Upper/lower extremity doppler studies have not been completed yet.  Upper Extremity Right Left  Brachial Pressures    Radial Waveforms    Ulnar Waveforms    Palmar Arch (Allen's Test)     Findings:      Lower  Extremity Right Left  Dorsalis Pedis    Anterior Tibial    Posterior Tibial    Ankle/Brachial Indices      Findings:     03/03/2012 1:14 PM [name] [name], RDMS, RDCS

## 2012-03-03 NOTE — Progress Notes (Signed)
Patient Name: [name] [name] Date of Encounter: 03/03/2012    SUBJECTIVE: This morning the patient had minimal lingering chest discomfort. Atrial fibrillation reverted to normal sinus rhythm during the night.  TELEMETRY:  Sinus rhythm: Filed Vitals:   03/03/12 1100 03/03/12 1115 03/03/12 1200 03/03/12 1300  BP: 147/76  163/78 135/85  Pulse: 62  61 59  Temp:  97.9 F (36.6 C)    TempSrc:  Oral    Resp: 20  20 19   Height:      Weight:      SpO2: 98%  98% 99%    Intake/Output Summary (Last 24 hours) at 03/03/12 1350 Last data filed at 03/03/12 1300  Gross per 24 hour  Intake 3181.3 ml  Output   2101 ml  Net 1080.3 ml    LABS: Basic Metabolic Panel:  Recent Labs  03/02/12 0858 03/03/12 0751  NA 137 136  K 3.8 3.9  CL 105 104  CO2 25 24  GLUCOSE 123* 199*  BUN 12 8  CREATININE 1.02 1.01  CALCIUM 8.5 8.2*   CBC:  Recent Labs  03/02/12 0530 03/03/12 0751  WBC 6.6 6.4  HGB 14.7 14.1  HCT 43.4 42.1  MCV 91.6 91.9  PLT 132* 122*   Cardiac Enzymes:  Recent Labs  03/01/12 0904 03/02/12 0858 03/02/12 1703 03/03/12 0751  CKTOTAL  --  74 60 50  CKMB  --  5.9* 4.8* 3.8  TROPONINI 0.37* <0.30  --   --    BNP: No components found with this basename: POCBNP,  Hemoglobin A1C:  Recent Labs  03/03/12 0751  HGBA1C 6.1*   Fasting Lipid Panel:  Recent Labs  03/03/12 0751  CHOL 84  HDL 40  LDLCALC 29  TRIG 75  CHOLHDL 2.1    Radiology/Studies:  No new data. X-rays reveal cardiomegaly.  Physical Exam: Blood pressure 135/85, pulse 59, temperature 97.9 F (36.6 C), temperature source Oral, resp. rate 19, height 6\' 2"  (1.88 m), weight 107.4 kg (236 lb 12.4 oz), SpO2 99.00%. Weight change: 0.9 kg (1 lb 15.7 oz)   The patient's chest is clear to auscultation.  The cardiac exam reveals no rub. An S4 gallop is audible. A soft 1/6 systolic murmur is heard.  Extremities reveal no edema. The right radial cath site is unremarkable.   ASSESSMENT:  1.  Acute coronary syndrome, now with documented severe three-vessel coronary disease including left main involvement.  2. Acute systolic heart failure, LVEF 55%. BNP greater than 1000.  3. Atrial fibrillation, resolved on IV amiodarone  Plan:   1. Metamucil  2. Coronary bypass grafting per Dr. Roxy Manns, Thursday, March 6.  3. Continue IV amiodarone and consider switching to oral therapy in the a.m.  [name] [name] 03/03/2012, 1:50 PM

## 2012-03-03 NOTE — Progress Notes (Signed)
ANTICOAGULATION CONSULT NOTE - Follow Up Consult  Pharmacy Consult for Heparin Indication: atrial fibrillation/ NSTEMI, 3V CAD  Allergies  Allergen Reactions  . Bee Venom Anaphylaxis    Patient Measurements: Height: 6\' 2"  (188 cm) Weight: 236 lb 12.4 oz (107.4 kg) IBW/kg (Calculated) : 82.2 Heparin Dosing Weight: 104 kg  Vital Signs: Temp: 97.6 F (36.4 C) (03/03 0714) Temp src: Oral (03/03 0714) BP: 129/72 mmHg (03/03 0800) Pulse Rate: 63 (03/03 0800)  Labs:  Recent Labs  03/01/12 0904 03/02/12 0530 03/02/12 0858 03/02/12 1703 03/02/12 2048 03/03/12 0751  HGB 15.8 14.7  --   --   --  14.1  HCT 45.9 43.4  --   --   --  42.1  PLT 139* 132*  --   --   --  122*  APTT  --   --   --   --   --  79*  LABPROT 13.1  --  12.7  --   --  12.9  INR 1.00  --  0.96  --   --  0.98  HEPARINUNFRC 0.41 0.76*  --   --  0.33 0.37  CREATININE 1.12  --  1.02  --   --  1.01  CKTOTAL  --   --  74 60  --  50  CKMB  --   --  5.9* 4.8*  --  3.8  TROPONINI 0.37*  --  <0.30  --   --   --     Estimated Creatinine Clearance: 72.3 ml/min (by C-G formula based on Cr of 1.01).  Assessment: 39 yom s/p cath noted for CABG on Thursday. Patient is therapeutic on heparin (0.41).   Goal of Therapy:  Heparin level 0.3-0.7 units/ml Monitor platelets by anticoagulation protocol: Yes   Plan:  -Continue heparin at 1300 units/hr and f/u AM heparin level  Hildred Laser, Pharm D 03/03/2012 9:54 AM

## 2012-03-04 ENCOUNTER — Encounter (HOSPITAL_COMMUNITY): Payer: Self-pay | Admitting: Thoracic Surgery (Cardiothoracic Vascular Surgery)

## 2012-03-04 DIAGNOSIS — I2510 Atherosclerotic heart disease of native coronary artery without angina pectoris: Secondary | ICD-10-CM

## 2012-03-04 DIAGNOSIS — I4891 Unspecified atrial fibrillation: Secondary | ICD-10-CM

## 2012-03-04 DIAGNOSIS — Z01810 Encounter for preprocedural cardiovascular examination: Secondary | ICD-10-CM

## 2012-03-04 MED ORDER — AMIODARONE HCL 200 MG PO TABS
400.0000 mg | ORAL_TABLET | Freq: Every day | ORAL | Status: DC
  Administered 2012-03-04 – 2012-03-05 (×2): 400 mg via ORAL
  Filled 2012-03-04 (×3): qty 2

## 2012-03-04 NOTE — Progress Notes (Signed)
   CARDIOTHORACIC SURGERY PROGRESS NOTE  2 Days Post-Op  S/P Procedure(s) (LRB): LEFT HEART CATH (Right)  Subjective: No chest pain all day today.  Staying in NSR.  No SOB  Objective: Vital signs in last 24 hours: Temp:  [97.6 F (36.4 C)-98 F (36.7 C)] 98 F (36.7 C) (03/04 1522) Pulse Rate:  [52-66] 64 (03/04 1600) Cardiac Rhythm:  [-] Normal sinus rhythm (03/04 1600) Resp:  [14-22] 20 (03/04 1600) BP: (114-170)/(59-106) 162/88 mmHg (03/04 1600) SpO2:  [94 %-99 %] 99 % (03/04 1600) Weight:  [108.228 kg (238 lb 9.6 oz)] 108.228 kg (238 lb 9.6 oz) (03/04 0500)  Physical Exam:  Rhythm:   sinus  Breath sounds: clear  Heart sounds:  RRR  Incisions:  n/a  Abdomen:  soft  Extremities:  Warm, right hand swollen from previous IV site   Intake/Output from previous day: 03/03 0701 - 03/04 0700 In: 3399.8 [P.O.:560; I.V.:2839.8] Out: 2701 [Urine:2700; Stool:1] Intake/Output this shift: Total I/O In: 1223.5 [P.O.:240; I.V.:983.5] Out: 400 [Urine:400]  Lab Results:  Recent Labs  03/02/12 0530 03/03/12 0751  WBC 6.6 6.4  HGB 14.7 14.1  HCT 43.4 42.1  PLT 132* 122*   BMET:  Recent Labs  03/02/12 0858 03/03/12 0751  NA 137 136  K 3.8 3.9  CL 105 104  CO2 25 24  GLUCOSE 123* 199*  BUN 12 8  CREATININE 1.02 1.01  CALCIUM 8.5 8.2*    CBG (last 3)  No results found for this basename: GLUCAP,  in the last 72 hours PT/INR:   Recent Labs  03/03/12 0751  LABPROT 12.9  INR 0.98    CXR:  N/A  Assessment/Plan: S/P Procedure(s) (LRB): LEFT HEART CATH (Right)  Stable For OR Thursday  [name],[name] 03/04/2012 6:15 PM

## 2012-03-04 NOTE — ED Provider Notes (Signed)
History     CSN: [id number]  Arrival date & time 02/29/12  2127   First MD Initiated Contact with Patient 02/29/12 2152      Chief Complaint  Patient presents with  . Chest Pain    (Consider location/radiation/quality/duration/timing/severity/associated sxs/prior treatment) HPI EMS-pt to ed from home with chest pain. Started tonight during supper. Pt took total of 3 nitro and 324 ASA at home with no relief still was 8/10. Pt has hx of angina. Having active chest pain.  Discussed with cardiology who recommended slowing rate with diltiazem.    Past Medical History  Diagnosis Date  . Hypertension   . Angina   . Coronary artery disease   . CAD (coronary artery disease)     Cath April 2012 40% left main, 50% LAD, 40% OM, occluded RCA with L-R collaterals   . Hypertension   . BPH (benign prostatic hypertrophy)   . Chronic venous insufficiency   . Gout   . Hyperlipidemia   . Obesity (BMI 30-39.9)   . Glaucoma   . Gout     Past Surgical History  Procedure Laterality Date  . Cardiac catheterization    . Prostatectomy    . Cataract extraction, bilateral      Family History  Problem Relation Age of Onset  . Dementia Mother   . Stroke Brother     History  Substance Use Topics  . Smoking status: Former Research scientist (life sciences)  . Smokeless tobacco: Never Used  . Alcohol Use: 0.6 oz/week    1 Shots of liquor per week     Comment: weekly      Review of Systems  All other systems reviewed and are negative.    Allergies  Bee venom  Home Medications  No current outpatient prescriptions on file.  BP 138/70  Pulse 57  Temp(Src) 97.6 F (36.4 C) (Oral)  Resp 15  Ht 6\' 2"  (1.88 m)  Wt 236 lb 12.4 oz (107.4 kg)  BMI 30.39 kg/m2  SpO2 96%  Physical Exam  Nursing note and vitals reviewed. Constitutional: He is oriented to person, place, and time. He appears well-developed and well-nourished. No distress.  HENT:  Head: Normocephalic and atraumatic.  Eyes: Pupils are equal,  round, and reactive to light.  Neck: Normal range of motion.  Cardiovascular: Intact distal pulses.  An irregularly irregular rhythm present. Tachycardia present.   Pulmonary/Chest: No respiratory distress.  Abdominal: Normal appearance. He exhibits no distension.  Musculoskeletal: Normal range of motion.  Neurological: He is alert and oriented to person, place, and time. No cranial nerve deficit.  Skin: Skin is warm and dry. No rash noted.  Psychiatric: He has a normal mood and affect. His behavior is normal.    ED Course  CARDIOVERSION Date/Time: 03/04/2012 12:48 AM Performed by: Dot Lanes Authorized by: Dot Lanes Consent given by: patient and spouse Patient understanding: patient states understanding of the procedure being performed Patient identity confirmed: verbally with patient Time out: Immediately prior to procedure a "time out" was called to verify the correct patient, procedure, equipment, support staff and site/side marked as required. Cardioversion basis: emergent Pre-procedure rhythm: atrial fibrillation Patient position: patient was placed in a supine position Chest area: chest area exposed Electrodes: pads Electrodes placed: anterior-posterior Number of attempts: 1 Attempt 1 mode: synchronous Attempt 1 waveform: biphasic Attempt 1 shock (in Joules): 200 Attempt 1 outcome: conversion to normal sinus rhythm Post-procedure rhythm: normal sinus rhythm Patient tolerance: Patient tolerated the procedure well with no immediate  complications.   (including critical care time)  Labs Reviewed  GLUCOSE, CAPILLARY - Abnormal; Notable for the following:    Glucose-Capillary 108 (*)    All other components within normal limits  TROPONIN I - Abnormal; Notable for the following:    Troponin I 0.37 (*)    All other components within normal limits  COMPREHENSIVE METABOLIC PANEL - Abnormal; Notable for the following:    Glucose, Bld 113 (*)    Total Protein 5.9  (*)    Albumin 2.9 (*)    Alkaline Phosphatase 37 (*)    GFR calc non Af Amer 59 (*)    GFR calc Af Amer 68 (*)    All other components within normal limits  CBC - Abnormal; Notable for the following:    Platelets 139 (*)    All other components within normal limits  HEPARIN LEVEL (UNFRACTIONATED) - Abnormal; Notable for the following:    Heparin Unfractionated 0.76 (*)    All other components within normal limits  CBC - Abnormal; Notable for the following:    Platelets 132 (*)    All other components within normal limits  CK TOTAL AND CKMB - Abnormal; Notable for the following:    CK, MB 5.9 (*)    All other components within normal limits  BASIC METABOLIC PANEL - Abnormal; Notable for the following:    Glucose, Bld 123 (*)    GFR calc non Af Amer 66 (*)    GFR calc Af Amer 76 (*)    All other components within normal limits  PLATELET INHIBITION P2Y12 - Abnormal; Notable for the following:    Platelet Function  P2Y12 85 (*)    All other components within normal limits  CK TOTAL AND CKMB - Abnormal; Notable for the following:    CK, MB 4.8 (*)    All other components within normal limits  COMPREHENSIVE METABOLIC PANEL - Abnormal; Notable for the following:    Glucose, Bld 199 (*)    Calcium 8.2 (*)    Total Protein 5.4 (*)    Albumin 2.6 (*)    Alkaline Phosphatase 36 (*)    GFR calc non Af Amer 67 (*)    GFR calc Af Amer 77 (*)    All other components within normal limits  CBC - Abnormal; Notable for the following:    Platelets 122 (*)    All other components within normal limits  BLOOD GAS, ARTERIAL - Abnormal; Notable for the following:    Bicarbonate 24.2 (*)    All other components within normal limits  PRO B NATRIURETIC PEPTIDE - Abnormal; Notable for the following:    Pro B Natriuretic peptide (BNP) 1089.0 (*)    All other components within normal limits  APTT - Abnormal; Notable for the following:    aPTT 79 (*)    All other components within normal limits   HEMOGLOBIN A1C - Abnormal; Notable for the following:    Hemoglobin A1C 6.1 (*)    Mean Plasma Glucose 128 (*)    All other components within normal limits  POCT I-STAT, CHEM 8 - Abnormal; Notable for the following:    BUN 26 (*)    Glucose, Bld 107 (*)    All other components within normal limits  MRSA PCR SCREENING  TSH  HEPARIN LEVEL (UNFRACTIONATED)  PROTIME-INR  TROPONIN I  PROTIME-INR  HEPARIN LEVEL (UNFRACTIONATED)  URINALYSIS, ROUTINE W REFLEX MICROSCOPIC  HEPARIN LEVEL (UNFRACTIONATED)  PREALBUMIN  PROTIME-INR  TSH  CK TOTAL AND CKMB  LIPID PANEL  HEPARIN LEVEL (UNFRACTIONATED)  POCT I-STAT TROPONIN I  TYPE AND SCREEN   Dg Chest Port 1 View  03/03/2012  *RADIOLOGY REPORT*  Clinical Data: Left heart catheterization, possible MI  PORTABLE CHEST - 1 VIEW  Comparison: Portable chest x-ray of 02/29/2012  Findings: No active infiltrate or effusion is seen.  The lungs appears slightly hyperaerated.  Cardiomegaly is stable.  No skeletal abnormality is noted other than degenerative change of the thoracic spine.  IMPRESSION: Stable chest x-ray.  No active lung disease.   Original Report Authenticated By: Ivar Drape, M.D.      1. Atrial fibrillation with rapid ventricular response   2. Unstable angina   3. CAD (coronary artery disease)   4. NSTEMI (non-ST elevated myocardial infarction)   5. Coronary atherosclerosis of native coronary artery       MDM  Patient became unstable before cardizem was started.  Patient cardioverted with no difficulty.         Dot Lanes, MD 03/04/12 [phone]

## 2012-03-04 NOTE — Progress Notes (Signed)
ANTICOAGULATION CONSULT NOTE - Follow Up Consult  Pharmacy Consult for Heparin Indication: atrial fibrillation/ NSTEMI, 3V CAD  Allergies  Allergen Reactions  . Bee Venom Anaphylaxis    Patient Measurements: Height: 6\' 2"  (188 cm) Weight: 238 lb 9.6 oz (108.228 kg) IBW/kg (Calculated) : 82.2 Heparin Dosing Weight: 104 kg  Vital Signs: Temp: 97.7 F (36.5 C) (03/04 0742) Temp src: Oral (03/04 0742) BP: 125/59 mmHg (03/04 0700) Pulse Rate: 55 (03/04 0700)  Labs:  Recent Labs  03/01/12 0904 03/02/12 0530 03/02/12 0858 03/02/12 1703 03/02/12 2048 03/03/12 0751 03/04/12 0330  HGB 15.8 14.7  --   --   --  14.1  --   HCT 45.9 43.4  --   --   --  42.1  --   PLT 139* 132*  --   --   --  122*  --   APTT  --   --   --   --   --  79*  --   LABPROT 13.1  --  12.7  --   --  12.9  --   INR 1.00  --  0.96  --   --  0.98  --   HEPARINUNFRC 0.41 0.76*  --   --  0.33 0.37 0.61  CREATININE 1.12  --  1.02  --   --  1.01  --   CKTOTAL  --   --  74 60  --  50  --   CKMB  --   --  5.9* 4.8*  --  3.8  --   TROPONINI 0.37*  --  <0.30  --   --   --   --     Estimated Creatinine Clearance: 72.6 ml/min (by C-G formula based on Cr of 1.01).  Assessment: 44 yom s/p cath noted for CABG on Thursday. Patient is therapeutic on heparin (0.61).   Goal of Therapy:  Heparin level 0.3-0.7 units/ml Monitor platelets by anticoagulation protocol: Yes   Plan:  -Continue heparin at 1300 units/hr and f/u AM heparin level  Excell Seltzer, Pharm D 03/04/2012 7:47 AM

## 2012-03-04 NOTE — Progress Notes (Signed)
Subjective:  Had vague chest pain this a.m.  Not SOB, Events reviewed.  Objective:  Vital Signs in the last 24 hours: BP 125/59  Pulse 55  Temp(Src) 97.7 F (36.5 C) (Oral)  Resp 16  Ht 6\' 2"  (1.88 m)  Wt 108.228 kg (238 lb 9.6 oz)  BMI 30.62 kg/m2  SpO2 97%  Physical Exam: Pleasant WM n NAD Lungs:  ClearCardiac:  Regular rhythm, normal S1 and S2, no S3 Extremities:  No edema present  Intake/Output from previous day: 03/03 0701 - 03/04 0700 In: 3420.2 [P.O.:560; I.V.:2860.2] Out: 2701 [Urine:2700; Stool:1]  Weight Filed Weights   03/01/12 1115 03/03/12 0500 03/04/12 0500  Weight: 106.5 kg (234 lb 12.6 oz) 107.4 kg (236 lb 12.4 oz) 108.228 kg (238 lb 9.6 oz)    Lab Results: Basic Metabolic Panel:  Recent Labs  03/02/12 0858 03/03/12 0751  NA 137 136  K 3.8 3.9  CL 105 104  CO2 25 24  GLUCOSE 123* 199*  BUN 12 8  CREATININE 1.02 1.01   CBC:  Recent Labs  03/02/12 0530 03/03/12 0751  WBC 6.6 6.4  HGB 14.7 14.1  HCT 43.4 42.1  MCV 91.6 91.9  PLT 132* 122*   Cardiac Enzymes:  Recent Labs  03/01/12 0904 03/02/12 0858 03/02/12 1703 03/03/12 0751  CKTOTAL  --  74 60 50  CKMB  --  5.9* 4.8* 3.8  TROPONINI 0.37* <0.30  --   --     Telemetry: Sinus rhythm  Assessment/Plan:  1. Unstable angina/nonSTEMI 2. Atrial fibrillation 3. Severe 3 VD  Rec:  Asked about activity.  I suggested keeping it to a minimum.  Plans for CABG on Thursday.. Change to po amiodarone and load for surgery for now.    Kerry Hough  MD [facility]  03/04/2012, 8:47 AM

## 2012-03-04 NOTE — Progress Notes (Signed)
Pre-op Cardiac Surgery  Carotid Findings:  There is no obvious evidence of hemodynamically significant internal carotid artery stenosis bilaterally. Vertebral arteries are patent with antegrade flow. The right vertebral artery demonstrates an atypical waveform. Right BP= 133/78, left BP=135/85.     Upper Extremity Right Left  Brachial Pressures 151 Triphasic 149 Triphasic  Radial Waveforms Triphasic Triphasic  Ulnar Waveforms Triphasic Triphasic  Palmar Arch (Allen's Test) Normal Normal   Findings:  Doppler waveforms remained normal bilaterally with both radial and ulnar compressions.    Lower  Extremity Right Left  Dorsalis Pedis 175 Biphasic 180 Biphasic      Posterior Tibial 202 Biphasic 172 Biphasic  Ankle/Brachial Indices 1.35 1.20    Findings:  ABIs are within normal limits bilaterally at rest   03/04/2012 3:56 PM Maudry Mayhew, [place], Imogene, Donaldson 03/04/2012, 3:56 PM

## 2012-03-05 ENCOUNTER — Other Ambulatory Visit: Payer: Self-pay | Admitting: *Deleted

## 2012-03-05 DIAGNOSIS — I2510 Atherosclerotic heart disease of native coronary artery without angina pectoris: Secondary | ICD-10-CM

## 2012-03-05 LAB — HEPARIN LEVEL (UNFRACTIONATED): Heparin Unfractionated: 0.55 IU/mL (ref 0.30–0.70)

## 2012-03-05 LAB — TYPE AND SCREEN: Antibody Screen: NEGATIVE

## 2012-03-05 MED ORDER — METOPROLOL TARTRATE 12.5 MG HALF TABLET
12.5000 mg | ORAL_TABLET | Freq: Once | ORAL | Status: DC
  Filled 2012-03-05: qty 1

## 2012-03-05 MED ORDER — VERAPAMIL HCL 2.5 MG/ML IV SOLN
INTRAVENOUS | Status: AC
  Administered 2012-03-06: 09:00:00
  Filled 2012-03-05: qty 2.5

## 2012-03-05 MED ORDER — DEXTROSE 5 % IV SOLN
750.0000 mg | INTRAVENOUS | Status: DC
  Filled 2012-03-05: qty 750

## 2012-03-05 MED ORDER — SODIUM CHLORIDE 0.9 % IV SOLN
INTRAVENOUS | Status: DC
  Filled 2012-03-05: qty 1

## 2012-03-05 MED ORDER — CEFUROXIME SODIUM 1.5 G IJ SOLR
1.5000 g | INTRAMUSCULAR | Status: AC
  Administered 2012-03-06: .75 g via INTRAVENOUS
  Administered 2012-03-06: 1.5 g via INTRAVENOUS
  Filled 2012-03-05: qty 1.5

## 2012-03-05 MED ORDER — SODIUM CHLORIDE 0.9 % IV SOLN
INTRAVENOUS | Status: DC
  Filled 2012-03-05: qty 40

## 2012-03-05 MED ORDER — CHLORHEXIDINE GLUCONATE 4 % EX LIQD
60.0000 mL | Freq: Once | CUTANEOUS | Status: AC
  Administered 2012-03-06: 4 via TOPICAL
  Filled 2012-03-05 (×2): qty 60

## 2012-03-05 MED ORDER — NITROGLYCERIN IN D5W 200-5 MCG/ML-% IV SOLN
2.0000 ug/min | INTRAVENOUS | Status: DC
  Filled 2012-03-05: qty 250

## 2012-03-05 MED ORDER — DOPAMINE-DEXTROSE 3.2-5 MG/ML-% IV SOLN
2.0000 ug/kg/min | INTRAVENOUS | Status: DC
  Filled 2012-03-05: qty 250

## 2012-03-05 MED ORDER — DEXMEDETOMIDINE HCL IN NACL 400 MCG/100ML IV SOLN
0.1000 ug/kg/h | INTRAVENOUS | Status: AC
  Administered 2012-03-06: 0.3 ug/kg/h via INTRAVENOUS
  Filled 2012-03-05: qty 100

## 2012-03-05 MED ORDER — PHENYLEPHRINE HCL 10 MG/ML IJ SOLN
30.0000 ug/min | INTRAVENOUS | Status: DC
  Filled 2012-03-05: qty 2

## 2012-03-05 MED ORDER — BISACODYL 5 MG PO TBEC: 5.0000 mg | DELAYED_RELEASE_TABLET | Freq: Once | ORAL | Status: DC

## 2012-03-05 MED ORDER — POTASSIUM CHLORIDE 2 MEQ/ML IV SOLN
80.0000 meq | INTRAVENOUS | Status: DC
  Filled 2012-03-05: qty 40

## 2012-03-05 MED ORDER — VANCOMYCIN HCL 10 G IV SOLR
1250.0000 mg | INTRAVENOUS | Status: AC
  Administered 2012-03-06: 1250 mg via INTRAVENOUS
  Filled 2012-03-05: qty 1250

## 2012-03-05 MED ORDER — TEMAZEPAM 15 MG PO CAPS
15.0000 mg | ORAL_CAPSULE | Freq: Once | ORAL | Status: AC | PRN
  Administered 2012-03-05: 15 mg via ORAL
  Filled 2012-03-05: qty 1

## 2012-03-05 MED ORDER — EPINEPHRINE HCL 1 MG/ML IJ SOLN
0.5000 ug/min | INTRAVENOUS | Status: DC
  Filled 2012-03-05: qty 4

## 2012-03-05 MED ORDER — VANCOMYCIN HCL 1000 MG IV SOLR
INTRAVENOUS | Status: AC
  Administered 2012-03-06: 09:00:00
  Filled 2012-03-05 (×2): qty 1000

## 2012-03-05 MED ORDER — MAGNESIUM SULFATE 50 % IJ SOLN
40.0000 meq | INTRAMUSCULAR | Status: DC
  Filled 2012-03-05: qty 10

## 2012-03-05 NOTE — Progress Notes (Signed)
Discussed sternal precautions and mobility with pt. Very interested in progression after surgery. Has watched video. Will get [facility] booklet. Gray, ACSM

## 2012-03-05 NOTE — Progress Notes (Signed)
   CARDIOTHORACIC SURGERY PROGRESS NOTE  3 Days Post-Op  S/P Procedure(s) (LRB): LEFT HEART CATH (Right)  Subjective: Feels well. No chest pain. No SOB  Objective: Vital signs in last 24 hours: Temp:  [97.6 F (36.4 C)-98.1 F (36.7 C)] 97.8 F (36.6 C) (03/05 1613) Pulse Rate:  [50-65] 65 (03/05 1900) Cardiac Rhythm:  [-] Sinus bradycardia (03/05 1900) Resp:  [15-21] 18 (03/05 1900) BP: (114-161)/(55-82) 161/73 mmHg (03/05 1900) SpO2:  [94 %-97 %] 97 % (03/05 1900) Weight:  [109.498 kg (241 lb 6.4 oz)] 109.498 kg (241 lb 6.4 oz) (03/05 0500)  Physical Exam:  Rhythm:   sinus  Breath sounds: clear  Heart sounds:  RRR  Incisions:  n/a  Abdomen:  soft  Extremities:  warm   Intake/Output from previous day: 03/04 0701 - 03/05 0700 In: 2798.5 [P.O.:315; I.V.:2483.5] Out: 1250 [Urine:1250] Intake/Output this shift:    Lab Results:  Recent Labs  03/03/12 0751  WBC 6.4  HGB 14.1  HCT 42.1  PLT 122*   BMET:  Recent Labs  03/03/12 0751  NA 136  K 3.9  CL 104  CO2 24  GLUCOSE 199*  BUN 8  CREATININE 1.01  CALCIUM 8.2*    CBG (last 3)  No results found for this basename: GLUCAP,  in the last 72 hours PT/INR:   Recent Labs  03/03/12 0751  LABPROT 12.9  INR 0.98    CXR:  N/A  Assessment/Plan: S/P Procedure(s) (LRB): LEFT HEART CATH (Right)  For OR tomorrow.  I again reviewed the indications, risks, and potential benefits of surgery with Mr. Heyman and his family. All questions answered.  OWEN,CLARENCE H 03/05/2012 7:15 PM

## 2012-03-05 NOTE — Progress Notes (Signed)
ANTICOAGULATION CONSULT NOTE - Follow Up Consult  Pharmacy Consult for Heparin Indication: atrial fibrillation/ NSTEMI, 3V CAD  Allergies  Allergen Reactions  . Bee Venom Anaphylaxis    Patient Measurements: Height: 6\' 2"  (188 cm) Weight: 241 lb 6.4 oz (109.498 kg) IBW/kg (Calculated) : 82.2 Heparin Dosing Weight: 104 kg  Vital Signs: Temp: 97.8 F (36.6 C) (03/05 0341) Temp src: Oral (03/05 0341) BP: 124/62 mmHg (03/05 0800) Pulse Rate: 55 (03/05 0800)  Labs:  Recent Labs  03/02/12 0858 03/02/12 1703  03/03/12 0751 03/04/12 0330 03/05/12 0405  HGB  --   --   --  14.1  --   --   HCT  --   --   --  42.1  --   --   PLT  --   --   --  122*  --   --   APTT  --   --   --  79*  --   --   LABPROT 12.7  --   --  12.9  --   --   INR 0.96  --   --  0.98  --   --   HEPARINUNFRC  --   --   < > 0.37 0.61 0.55  CREATININE 1.02  --   --  1.01  --   --   CKTOTAL 74 60  --  50  --   --   CKMB 5.9* 4.8*  --  3.8  --   --   TROPONINI <0.30  --   --   --   --   --   < > = values in this interval not displayed.  Estimated Creatinine Clearance: 73 ml/min (by C-G formula based on Cr of 1.01).  Assessment: 48 yom s/p cath noted for CABG on Thursday. Patient is therapeutic on heparin (0.55).   Goal of Therapy:  Heparin level 0.3-0.7 units/ml Monitor platelets by anticoagulation protocol: Yes   Plan:  -Continue heparin at 1300 units/hr and f/u AM heparin level  Excell Seltzer, Pharm D 03/05/2012 8:09 AM

## 2012-03-05 NOTE — Progress Notes (Signed)
Subjective:  No chest pain overnight. Not SOB.  Awaiting CABG tomorrow. Objective:  Vital Signs in the last 24 hours: BP 124/62  Pulse 55  Temp(Src) 97.9 F (36.6 C) (Oral)  Resp 16  Ht 6\' 2"  (1.88 m)  Wt 109.498 kg (241 lb 6.4 oz)  BMI 30.98 kg/m2  SpO2 95%  Physical Exam: Pleasant WM n NAD Lungs:  Clear Cardiac:  Regular rhythm, normal S1 and S2, no S3 Extremities:  No edema present  Intake/Output from previous day: 03/04 0701 - 03/05 0700 In: 2798.5 [P.O.:315; I.V.:2483.5] Out: 1250 [Urine:1250]  Weight Filed Weights   03/03/12 0500 03/04/12 0500 03/05/12 0500  Weight: 107.4 kg (236 lb 12.4 oz) 108.228 kg (238 lb 9.6 oz) 109.498 kg (241 lb 6.4 oz)    Lab Results: Basic Metabolic Panel:  Recent Labs  03/02/12 0858 03/03/12 0751  NA 137 136  K 3.8 3.9  CL 105 104  CO2 25 24  GLUCOSE 123* 199*  BUN 12 8  CREATININE 1.02 1.01   CBC:  Recent Labs  03/03/12 0751  WBC 6.4  HGB 14.1  HCT 42.1  MCV 91.9  PLT 122*   Cardiac Enzymes:  Recent Labs  03/02/12 0858 03/02/12 1703 03/03/12 0751  CKTOTAL 74 60 50  CKMB 5.9* 4.8* 3.8  TROPONINI <0.30  --   --     Telemetry: Sinus rhythm overnight  Assessment/Plan:  1. Unstable angina/nonSTEMI 2. Atrial fibrillation currently in NSR on po amiodarone 3. Severe 3 VD  Rec:  CABG in am.  Continue IV heparin.    Kerry Hough  MD [facility]  03/05/2012, 8:57 AM

## 2012-03-06 ENCOUNTER — Encounter (HOSPITAL_COMMUNITY): Payer: Self-pay | Admitting: Anesthesiology

## 2012-03-06 ENCOUNTER — Encounter (HOSPITAL_COMMUNITY): Payer: Self-pay | Admitting: Certified Registered"

## 2012-03-06 ENCOUNTER — Inpatient Hospital Stay (HOSPITAL_COMMUNITY): Payer: Medicare Other | Admitting: Anesthesiology

## 2012-03-06 ENCOUNTER — Inpatient Hospital Stay (HOSPITAL_COMMUNITY): Payer: Medicare Other

## 2012-03-06 ENCOUNTER — Encounter (HOSPITAL_COMMUNITY)
Admission: EM | Disposition: A | Discharge status: Home / Self Care | Payer: Self-pay | Source: Home / Self Care | Attending: Thoracic Surgery (Cardiothoracic Vascular Surgery)

## 2012-03-06 DIAGNOSIS — I2510 Atherosclerotic heart disease of native coronary artery without angina pectoris: Secondary | ICD-10-CM

## 2012-03-06 DIAGNOSIS — I4891 Unspecified atrial fibrillation: Secondary | ICD-10-CM

## 2012-03-06 DIAGNOSIS — Z951 Presence of aortocoronary bypass graft: Secondary | ICD-10-CM

## 2012-03-06 DIAGNOSIS — Z8679 Personal history of other diseases of the circulatory system: Secondary | ICD-10-CM

## 2012-03-06 HISTORY — DX: Personal history of other diseases of the circulatory system: Z86.79

## 2012-03-06 HISTORY — DX: Presence of aortocoronary bypass graft: Z95.1

## 2012-03-06 HISTORY — PX: ENDOVEIN HARVEST OF GREATER SAPHENOUS VEIN: SHX5059

## 2012-03-06 HISTORY — PX: INTRAOPERATIVE TRANSESOPHAGEAL ECHOCARDIOGRAM: SHX5062

## 2012-03-06 HISTORY — PX: MAZE: SHX5063

## 2012-03-06 HISTORY — PX: CORONARY ARTERY BYPASS GRAFT: SHX141

## 2012-03-06 LAB — CBC
HCT: 29.4 % — ABNORMAL LOW (ref 39.0–52.0)
HCT: 28 % — ABNORMAL LOW (ref 39.0–52.0)
Hemoglobin: 13.6 g/dL (ref 13.0–17.0)
Hemoglobin: 12.4 g/dL — ABNORMAL LOW (ref 13.0–17.0)
Hemoglobin: 9.5 g/dL — ABNORMAL LOW (ref 13.0–17.0)
MCH: 31.2 pg (ref 26.0–34.0)
MCH: 31.3 pg (ref 26.0–34.0)
MCHC: 34.3 g/dL (ref 30.0–36.0)
MCHC: 33.9 g/dL (ref 30.0–36.0)
MCV: 90.7 fL (ref 78.0–100.0)
MCV: 92.1 fL (ref 78.0–100.0)
Platelets: 113 10*3/uL — ABNORMAL LOW (ref 150–400)
RBC: 4.36 MIL/uL (ref 4.22–5.81)
RBC: 3.24 MIL/uL — ABNORMAL LOW (ref 4.22–5.81)
RBC: 3.04 MIL/uL — ABNORMAL LOW (ref 4.22–5.81)
RDW: 14.5 % (ref 11.5–15.5)
WBC: 7.2 10*3/uL (ref 4.0–10.5)
WBC: 9.2 10*3/uL (ref 4.0–10.5)
WBC: 12.8 10*3/uL — ABNORMAL HIGH (ref 4.0–10.5)

## 2012-03-06 LAB — POCT I-STAT 4, (NA,K, GLUC, HGB,HCT)
Glucose, Bld: 96 mg/dL (ref 70–99)
Glucose, Bld: 95 mg/dL (ref 70–99)
Glucose, Bld: 120 mg/dL — ABNORMAL HIGH (ref 70–99)
Glucose, Bld: 118 mg/dL — ABNORMAL HIGH (ref 70–99)
HCT: 33 % — ABNORMAL LOW (ref 39.0–52.0)
HCT: 33 % — ABNORMAL LOW (ref 39.0–52.0)
HCT: 33 % — ABNORMAL LOW (ref 39.0–52.0)
Hemoglobin: 11.2 g/dL — ABNORMAL LOW (ref 13.0–17.0)
Hemoglobin: 11.2 g/dL — ABNORMAL LOW (ref 13.0–17.0)
Potassium: 3.9 mEq/L (ref 3.5–5.1)
Potassium: 4.7 mEq/L (ref 3.5–5.1)
Potassium: 3.8 mEq/L (ref 3.5–5.1)
Sodium: 137 mEq/L (ref 135–145)
Sodium: 138 mEq/L (ref 135–145)

## 2012-03-06 LAB — PROTIME-INR
INR: 1.27 (ref 0.00–1.49)
Prothrombin Time: 15.6 seconds — ABNORMAL HIGH (ref 11.6–15.2)

## 2012-03-06 LAB — POCT I-STAT 3, ART BLOOD GAS (G3+)
Acid-Base Excess: 1 mmol/L (ref 0.0–2.0)
Bicarbonate: 25.3 mEq/L — ABNORMAL HIGH (ref 20.0–24.0)
Bicarbonate: 23.6 mEq/L (ref 20.0–24.0)
O2 Saturation: 100 %
O2 Saturation: 100 %
O2 Saturation: 100 %
O2 Saturation: 98 %
TCO2: 27 mmol/L (ref 0–100)
TCO2: 26 mmol/L (ref 0–100)
TCO2: 25 mmol/L (ref 0–100)
pCO2 arterial: 43.4 mmHg (ref 35.0–45.0)
pCO2 arterial: 37.9 mmHg (ref 35.0–45.0)
pH, Arterial: 7.397 (ref 7.350–7.450)
pO2, Arterial: 386 mmHg — ABNORMAL HIGH (ref 80.0–100.0)
pO2, Arterial: 334 mmHg — ABNORMAL HIGH (ref 80.0–100.0)

## 2012-03-06 LAB — POCT I-STAT, CHEM 8
BUN: 10 mg/dL (ref 6–23)
Chloride: 109 mEq/L (ref 96–112)
Creatinine, Ser: 1.2 mg/dL (ref 0.50–1.35)
Glucose, Bld: 117 mg/dL — ABNORMAL HIGH (ref 70–99)
HCT: 26 % — ABNORMAL LOW (ref 39.0–52.0)
Hemoglobin: 9.5 g/dL — ABNORMAL LOW (ref 13.0–17.0)
Potassium: 5 mEq/L (ref 3.5–5.1)
Potassium: 5 mEq/L (ref 3.5–5.1)
Sodium: 139 mEq/L (ref 135–145)

## 2012-03-06 LAB — BASIC METABOLIC PANEL
CO2: 26 mEq/L (ref 19–32)
Calcium: 8.7 mg/dL (ref 8.4–10.5)
Chloride: 106 mEq/L (ref 96–112)
Glucose, Bld: 96 mg/dL (ref 70–99)
Sodium: 136 mEq/L (ref 135–145)

## 2012-03-06 LAB — CREATININE, SERUM
GFR calc Af Amer: 62 mL/min — ABNORMAL LOW (ref >90)
GFR calc non Af Amer: 54 mL/min — ABNORMAL LOW (ref >90)

## 2012-03-06 LAB — APTT: aPTT: 38 seconds — ABNORMAL HIGH (ref 24–37)

## 2012-03-06 LAB — PLATELET COUNT: Platelets: 85 10*3/uL — ABNORMAL LOW (ref 150–400)

## 2012-03-06 LAB — GLUCOSE, CAPILLARY: Glucose-Capillary: 104 mg/dL — ABNORMAL HIGH (ref 70–99)

## 2012-03-06 LAB — HEPARIN LEVEL (UNFRACTIONATED): Heparin Unfractionated: 0.11 IU/mL — ABNORMAL LOW (ref 0.30–0.70)

## 2012-03-06 LAB — PLATELET INHIBITION P2Y12: Platelet Function  P2Y12: 165 [PRU] — ABNORMAL LOW (ref 194–418)

## 2012-03-06 LAB — HEMOGLOBIN AND HEMATOCRIT, BLOOD: HCT: 33.9 % — ABNORMAL LOW (ref 39.0–52.0)

## 2012-03-06 SURGERY — CORONARY ARTERY BYPASS GRAFTING (CABG)
Anesthesia: General | Site: Leg Upper | Laterality: Right | Wound class: Clean

## 2012-03-06 MED ORDER — DOPAMINE-DEXTROSE 3.2-5 MG/ML-% IV SOLN: 0.0000 ug/kg/min | INTRAVENOUS | Status: DC

## 2012-03-06 MED ORDER — OXYCODONE HCL 5 MG PO TABS
5.0000 mg | ORAL_TABLET | ORAL | Status: DC | PRN
  Administered 2012-03-07: 10 mg via ORAL
  Administered 2012-03-07: 5 mg via ORAL
  Administered 2012-03-07 – 2012-03-10 (×6): 10 mg via ORAL
  Filled 2012-03-06 (×9): qty 2

## 2012-03-06 MED ORDER — BISACODYL 10 MG RE SUPP: 10.0000 mg | Freq: Every day | RECTAL | Status: DC

## 2012-03-06 MED ORDER — HYDROMORPHONE HCL PF 1 MG/ML IJ SOLN: 0.2500 mg | INTRAMUSCULAR | Status: DC | PRN

## 2012-03-06 MED ORDER — MAGNESIUM SULFATE 40 MG/ML IJ SOLN
4.0000 g | Freq: Once | INTRAMUSCULAR | Status: AC
  Administered 2012-03-06: 4 g via INTRAVENOUS
  Filled 2012-03-06: qty 100

## 2012-03-06 MED ORDER — ALBUMIN HUMAN 5 % IV SOLN
INTRAVENOUS | Status: AC
  Administered 2012-03-06: 12.5 g
  Filled 2012-03-06: qty 500

## 2012-03-06 MED ORDER — MORPHINE SULFATE 2 MG/ML IJ SOLN
2.0000 mg | INTRAMUSCULAR | Status: DC | PRN
  Administered 2012-03-07 (×3): 2 mg via INTRAVENOUS
  Filled 2012-03-06 (×3): qty 1

## 2012-03-06 MED ORDER — ALBUMIN HUMAN 5 % IV SOLN
250.0000 mL | INTRAVENOUS | Status: AC | PRN
  Administered 2012-03-06 (×4): 250 mL via INTRAVENOUS
  Filled 2012-03-06: qty 500

## 2012-03-06 MED ORDER — ACETAMINOPHEN 500 MG PO TABS
1000.0000 mg | ORAL_TABLET | Freq: Four times a day (QID) | ORAL | Status: AC
  Administered 2012-03-07 – 2012-03-11 (×17): 1000 mg via ORAL
  Filled 2012-03-06 (×19): qty 2

## 2012-03-06 MED ORDER — METOPROLOL TARTRATE 1 MG/ML IV SOLN
2.5000 mg | INTRAVENOUS | Status: DC | PRN
  Administered 2012-03-06: 2.5 mg via INTRAVENOUS

## 2012-03-06 MED ORDER — INSULIN ASPART 100 UNIT/ML ~~LOC~~ SOLN
0.0000 [IU] | SUBCUTANEOUS | Status: DC
  Administered 2012-03-07: 2 [IU] via SUBCUTANEOUS
  Administered 2012-03-07: 4 [IU] via SUBCUTANEOUS
  Administered 2012-03-07 (×2): 2 [IU] via SUBCUTANEOUS
  Administered 2012-03-07: 4 [IU] via SUBCUTANEOUS
  Administered 2012-03-07 – 2012-03-08 (×2): 2 [IU] via SUBCUTANEOUS
  Administered 2012-03-08: 08:00:00 via SUBCUTANEOUS

## 2012-03-06 MED ORDER — SODIUM CHLORIDE 0.9 % IV SOLN: INTRAVENOUS | Status: DC

## 2012-03-06 MED ORDER — ONDANSETRON HCL 4 MG/2ML IJ SOLN: 4.0000 mg | Freq: Once | INTRAMUSCULAR | Status: AC | PRN

## 2012-03-06 MED ORDER — SODIUM CHLORIDE 0.9 % IV SOLN: 250.0000 mL | INTRAVENOUS | Status: DC

## 2012-03-06 MED ORDER — LACTATED RINGERS IV SOLN
INTRAVENOUS | Status: DC | PRN
  Administered 2012-03-06 (×2): via INTRAVENOUS

## 2012-03-06 MED ORDER — VECURONIUM BROMIDE 10 MG IV SOLR
INTRAVENOUS | Status: DC | PRN
  Administered 2012-03-06: 10 mg via INTRAVENOUS

## 2012-03-06 MED ORDER — OXYCODONE HCL 5 MG PO TABS: 5.0000 mg | ORAL_TABLET | Freq: Once | ORAL | Status: AC | PRN

## 2012-03-06 MED ORDER — METOPROLOL TARTRATE 12.5 MG HALF TABLET
12.5000 mg | ORAL_TABLET | Freq: Two times a day (BID) | ORAL | Status: DC
  Administered 2012-03-09: 12.5 mg via ORAL
  Filled 2012-03-06 (×9): qty 1

## 2012-03-06 MED ORDER — DOCUSATE SODIUM 100 MG PO CAPS
200.0000 mg | ORAL_CAPSULE | Freq: Every day | ORAL | Status: DC
  Administered 2012-03-07 – 2012-03-12 (×4): 200 mg via ORAL
  Filled 2012-03-06 (×6): qty 2

## 2012-03-06 MED ORDER — NITROGLYCERIN IN D5W 200-5 MCG/ML-% IV SOLN: 0.0000 ug/min | INTRAVENOUS | Status: DC

## 2012-03-06 MED ORDER — ACETAMINOPHEN 160 MG/5ML PO SOLN: 975.0000 mg | Freq: Four times a day (QID) | ORAL | Status: DC

## 2012-03-06 MED ORDER — MIDAZOLAM HCL 2 MG/2ML IJ SOLN: 2.0000 mg | INTRAMUSCULAR | Status: DC | PRN

## 2012-03-06 MED ORDER — SODIUM CHLORIDE 0.45 % IV SOLN
INTRAVENOUS | Status: DC
  Administered 2012-03-06: 18:00:00 via INTRAVENOUS

## 2012-03-06 MED ORDER — SODIUM CHLORIDE 0.9 % IJ SOLN
3.0000 mL | Freq: Two times a day (BID) | INTRAMUSCULAR | Status: DC
  Administered 2012-03-07 – 2012-03-09 (×4): 3 mL via INTRAVENOUS

## 2012-03-06 MED ORDER — ACETAMINOPHEN 10 MG/ML IV SOLN
1000.0000 mg | Freq: Once | INTRAVENOUS | Status: AC
  Administered 2012-03-06: 1000 mg via INTRAVENOUS
  Filled 2012-03-06: qty 100

## 2012-03-06 MED ORDER — ROCURONIUM BROMIDE 100 MG/10ML IV SOLN
INTRAVENOUS | Status: DC | PRN
  Administered 2012-03-06: 50 mg via INTRAVENOUS
  Administered 2012-03-06: 30 mg via INTRAVENOUS
  Administered 2012-03-06: 50 mg via INTRAVENOUS

## 2012-03-06 MED ORDER — ASPIRIN 81 MG PO CHEW: 324.0000 mg | CHEWABLE_TABLET | Freq: Every day | ORAL | Status: DC

## 2012-03-06 MED ORDER — PROTAMINE SULFATE 10 MG/ML IV SOLN
INTRAVENOUS | Status: DC | PRN
  Administered 2012-03-06 (×3): 50 mg via INTRAVENOUS
  Administered 2012-03-06: 20 mg via INTRAVENOUS
  Administered 2012-03-06: 50 mg via INTRAVENOUS
  Administered 2012-03-06: 10 mg via INTRAVENOUS
  Administered 2012-03-06: 50 mg via INTRAVENOUS

## 2012-03-06 MED ORDER — DEXTROSE 5 % IV SOLN
1.5000 g | Freq: Two times a day (BID) | INTRAVENOUS | Status: AC
  Administered 2012-03-06 – 2012-03-08 (×4): 1.5 g via INTRAVENOUS
  Filled 2012-03-06 (×4): qty 1.5

## 2012-03-06 MED ORDER — PROPOFOL 10 MG/ML IV BOLUS
INTRAVENOUS | Status: DC | PRN
  Administered 2012-03-06: 50 mg via INTRAVENOUS
  Administered 2012-03-06: 40 mg via INTRAVENOUS

## 2012-03-06 MED ORDER — BISACODYL 5 MG PO TBEC
10.0000 mg | DELAYED_RELEASE_TABLET | Freq: Every day | ORAL | Status: DC
  Administered 2012-03-07 – 2012-03-13 (×5): 10 mg via ORAL
  Filled 2012-03-06 (×5): qty 2

## 2012-03-06 MED ORDER — INSULIN REGULAR BOLUS VIA INFUSION
0.0000 [IU] | Freq: Three times a day (TID) | INTRAVENOUS | Status: DC
  Filled 2012-03-06: qty 10

## 2012-03-06 MED ORDER — SODIUM CHLORIDE 0.9 % IJ SOLN
OROMUCOSAL | Status: DC | PRN
  Administered 2012-03-06 (×4): via TOPICAL

## 2012-03-06 MED ORDER — ONDANSETRON HCL 4 MG/2ML IJ SOLN: 4.0000 mg | Freq: Four times a day (QID) | INTRAMUSCULAR | Status: DC | PRN

## 2012-03-06 MED ORDER — ALBUMIN HUMAN 5 % IV SOLN
INTRAVENOUS | Status: DC | PRN
  Administered 2012-03-06 (×2): via INTRAVENOUS

## 2012-03-06 MED ORDER — FAMOTIDINE IN NACL 20-0.9 MG/50ML-% IV SOLN
20.0000 mg | Freq: Two times a day (BID) | INTRAVENOUS | Status: AC
  Administered 2012-03-06 (×2): 20 mg via INTRAVENOUS
  Filled 2012-03-06: qty 50

## 2012-03-06 MED ORDER — ALBUMIN HUMAN 5 % IV SOLN: 12.5000 g | Freq: Once | INTRAVENOUS | Status: AC

## 2012-03-06 MED ORDER — SODIUM CHLORIDE 0.9 % IV SOLN
10.0000 g | INTRAVENOUS | Status: DC | PRN
  Administered 2012-03-06: 5 g/h via INTRAVENOUS

## 2012-03-06 MED ORDER — DEXMEDETOMIDINE HCL IN NACL 200 MCG/50ML IV SOLN: 0.1000 ug/kg/h | INTRAVENOUS | Status: DC

## 2012-03-06 MED ORDER — FENTANYL CITRATE 0.05 MG/ML IJ SOLN
INTRAMUSCULAR | Status: DC | PRN
  Administered 2012-03-06: 100 ug via INTRAVENOUS
  Administered 2012-03-06: 250 ug via INTRAVENOUS
  Administered 2012-03-06: 400 ug via INTRAVENOUS
  Administered 2012-03-06: 250 ug via INTRAVENOUS
  Administered 2012-03-06: 150 ug via INTRAVENOUS
  Administered 2012-03-06: 250 ug via INTRAVENOUS
  Administered 2012-03-06: 100 ug via INTRAVENOUS
  Administered 2012-03-06: 150 ug via INTRAVENOUS
  Administered 2012-03-06: 100 ug via INTRAVENOUS
  Administered 2012-03-06: 250 ug via INTRAVENOUS

## 2012-03-06 MED ORDER — VANCOMYCIN HCL IN DEXTROSE 1-5 GM/200ML-% IV SOLN
1000.0000 mg | Freq: Once | INTRAVENOUS | Status: AC
  Administered 2012-03-06: 1000 mg via INTRAVENOUS
  Filled 2012-03-06: qty 200

## 2012-03-06 MED ORDER — OXYCODONE HCL 5 MG/5ML PO SOLN: 5.0000 mg | Freq: Once | ORAL | Status: AC | PRN

## 2012-03-06 MED ORDER — SODIUM CHLORIDE 0.9 % IJ SOLN: 3.0000 mL | INTRAMUSCULAR | Status: DC | PRN

## 2012-03-06 MED ORDER — ASPIRIN EC 325 MG PO TBEC
325.0000 mg | DELAYED_RELEASE_TABLET | Freq: Every day | ORAL | Status: DC
  Administered 2012-03-07 – 2012-03-09 (×3): 325 mg via ORAL
  Filled 2012-03-06 (×4): qty 1

## 2012-03-06 MED ORDER — MORPHINE SULFATE 2 MG/ML IJ SOLN: 1.0000 mg | INTRAMUSCULAR | Status: AC | PRN

## 2012-03-06 MED ORDER — LACTATED RINGERS IV SOLN: INTRAVENOUS | Status: DC

## 2012-03-06 MED ORDER — MIDAZOLAM HCL 5 MG/5ML IJ SOLN
INTRAMUSCULAR | Status: DC | PRN
  Administered 2012-03-06: 3 mg via INTRAVENOUS
  Administered 2012-03-06: 2 mg via INTRAVENOUS
  Administered 2012-03-06: 4 mg via INTRAVENOUS
  Administered 2012-03-06: 1 mg via INTRAVENOUS
  Administered 2012-03-06 (×7): 2 mg via INTRAVENOUS

## 2012-03-06 MED ORDER — METOPROLOL TARTRATE 25 MG/10 ML ORAL SUSPENSION
12.5000 mg | Freq: Two times a day (BID) | ORAL | Status: DC
  Filled 2012-03-06 (×3): qty 5

## 2012-03-06 MED ORDER — FENTANYL CITRATE 0.05 MG/ML IJ SOLN
INTRAMUSCULAR | Status: AC
  Filled 2012-03-06: qty 2

## 2012-03-06 MED ORDER — SODIUM CHLORIDE 0.9 % IV SOLN
100.0000 [IU] | INTRAVENOUS | Status: DC | PRN
  Administered 2012-03-06: 1 [IU]/h via INTRAVENOUS

## 2012-03-06 MED ORDER — PANTOPRAZOLE SODIUM 40 MG PO TBEC
40.0000 mg | DELAYED_RELEASE_TABLET | Freq: Every day | ORAL | Status: DC
  Administered 2012-03-08 – 2012-03-13 (×6): 40 mg via ORAL
  Filled 2012-03-06 (×6): qty 1

## 2012-03-06 MED ORDER — INSULIN ASPART 100 UNIT/ML ~~LOC~~ SOLN: 0.0000 [IU] | SUBCUTANEOUS | Status: AC

## 2012-03-06 MED ORDER — ALBUMIN HUMAN 5 % IV SOLN
12.5000 g | Freq: Once | INTRAVENOUS | Status: AC
  Administered 2012-03-06: 12.5 g via INTRAVENOUS

## 2012-03-06 MED ORDER — INSULIN REGULAR HUMAN 100 UNIT/ML IJ SOLN: INTRAMUSCULAR | Status: DC

## 2012-03-06 MED ORDER — MIDAZOLAM HCL 2 MG/2ML IJ SOLN
INTRAMUSCULAR | Status: AC
  Filled 2012-03-06: qty 2

## 2012-03-06 MED ORDER — DOPAMINE-DEXTROSE 1.6-5 MG/ML-% IV SOLN
INTRAVENOUS | Status: DC | PRN
  Administered 2012-03-06: 3 ug/kg/min via INTRAVENOUS

## 2012-03-06 MED ORDER — MEPERIDINE HCL 25 MG/ML IJ SOLN: 6.2500 mg | INTRAMUSCULAR | Status: DC | PRN

## 2012-03-06 MED ORDER — POTASSIUM CHLORIDE 10 MEQ/50ML IV SOLN
10.0000 meq | INTRAVENOUS | Status: AC
  Administered 2012-03-06 (×3): 10 meq via INTRAVENOUS

## 2012-03-06 MED ORDER — CALCIUM CHLORIDE 10 % IV SOLN
1.0000 g | Freq: Once | INTRAVENOUS | Status: AC | PRN
  Filled 2012-03-06: qty 10

## 2012-03-06 MED ORDER — PHENYLEPHRINE HCL 10 MG/ML IJ SOLN
0.0000 ug/min | INTRAMUSCULAR | Status: DC
  Administered 2012-03-07: 60 ug/min via INTRAVENOUS
  Filled 2012-03-06 (×2): qty 2

## 2012-03-06 MED ORDER — HEPARIN SODIUM (PORCINE) 1000 UNIT/ML IJ SOLN
INTRAMUSCULAR | Status: DC | PRN
  Administered 2012-03-06: 36000 [IU] via INTRAVENOUS
  Administered 2012-03-06: 2000 [IU] via INTRAVENOUS

## 2012-03-06 SURGICAL SUPPLY — 142 items
ADAPTER CARDIO PERF ANTE/RETRO (ADAPTER) ×4 IMPLANT
ADPR PRFSN 84XANTGRD RTRGD (ADAPTER) ×3
APL SKNCLS STERI-STRIP NONHPOA (GAUZE/BANDAGES/DRESSINGS) ×3
APPLIER CLIP 9.375 MED OPEN (MISCELLANEOUS)
APPLIER CLIP 9.375 SM OPEN (CLIP)
APR CLP MED 9.3 20 MLT OPN (MISCELLANEOUS)
APR CLP SM 9.3 20 MLT OPN (CLIP)
ATTRACTOMAT 16X20 MAGNETIC DRP (DRAPES) ×7 IMPLANT
BAG DECANTER FOR FLEXI CONT (MISCELLANEOUS) ×8 IMPLANT
BANDAGE ELASTIC 4 VELCRO ST LF (GAUZE/BANDAGES/DRESSINGS) ×4 IMPLANT
BANDAGE ELASTIC 6 VELCRO ST LF (GAUZE/BANDAGES/DRESSINGS) ×4 IMPLANT
BANDAGE GAUZE ELAST BULKY 4 IN (GAUZE/BANDAGES/DRESSINGS) ×4 IMPLANT
BASKET HEART (ORDER IN 25'S) (MISCELLANEOUS) ×1
BASKET HEART (ORDER IN 25S) (MISCELLANEOUS) ×3 IMPLANT
BENZOIN TINCTURE PRP APPL 2/3 (GAUZE/BANDAGES/DRESSINGS) ×4 IMPLANT
BLADE STERNUM SYSTEM 6 (BLADE) ×7 IMPLANT
BLADE SURG 11 STRL SS (BLADE) ×5 IMPLANT
BLADE SURG ROTATE 9660 (MISCELLANEOUS) ×1 IMPLANT
CANISTER SUCTION 2500CC (MISCELLANEOUS) ×7 IMPLANT
CANN PRFSN 3/8X14X24FR PCFC (MISCELLANEOUS)
CANN PRFSN 3/8XCNCT ST RT ANG (MISCELLANEOUS)
CANNULA GUNDRY RCSP 15FR (MISCELLANEOUS) ×4 IMPLANT
CANNULA PRFSN 3/8X14X24FR PCFC (MISCELLANEOUS) IMPLANT
CANNULA PRFSN 3/8XCNCT RT ANG (MISCELLANEOUS) IMPLANT
CANNULA VEN MTL TIP RT (MISCELLANEOUS)
CANNULA VENNOUS METAL TIP 20FR (CANNULA) ×1 IMPLANT
CATH CPB KIT OWEN (MISCELLANEOUS) ×4 IMPLANT
CATH THORACIC 28FR (CATHETERS) IMPLANT
CATH THORACIC 28FR RT ANG (CATHETERS) IMPLANT
CATH THORACIC 36FR (CATHETERS) ×4 IMPLANT
CATH THORACIC 36FR RT ANG (CATHETERS) ×4 IMPLANT
CLAMP ISOLATOR SYNERGY LG (MISCELLANEOUS) ×5 IMPLANT
CLIP APPLIE 9.375 MED OPEN (MISCELLANEOUS) IMPLANT
CLIP APPLIE 9.375 SM OPEN (CLIP) IMPLANT
CLIP FOGARTY SPRING 6M (CLIP) IMPLANT
CLIP TI MEDIUM 24 (CLIP) IMPLANT
CLIP TI WIDE RED SMALL 24 (CLIP) IMPLANT
CLOTH BEACON ORANGE TIMEOUT ST (SAFETY) ×7 IMPLANT
CONN 1/2X1/2X1/2  BEN (MISCELLANEOUS) ×1
CONN 1/2X1/2X1/2 BEN (MISCELLANEOUS) ×3 IMPLANT
CONN 3/8X1/2 ST GISH (MISCELLANEOUS) ×8 IMPLANT
CONN Y 3/8X3/8X3/8  BEN (MISCELLANEOUS)
CONN Y 3/8X3/8X3/8 BEN (MISCELLANEOUS) IMPLANT
CONNECTOR 1/2X3/8X1/2 3 WAY (MISCELLANEOUS) ×1
CONNECTOR 1/2X3/8X1/2 3WAY (MISCELLANEOUS) IMPLANT
COVER SURGICAL LIGHT HANDLE (MISCELLANEOUS) ×7 IMPLANT
CRADLE DONUT ADULT HEAD (MISCELLANEOUS) ×7 IMPLANT
DRAIN CHANNEL 32F RND 10.7 FF (WOUND CARE) ×7 IMPLANT
DRAPE CARDIOVASCULAR INCISE (DRAPES) ×4
DRAPE SLUSH/WARMER DISC (DRAPES) ×7 IMPLANT
DRAPE SRG 135X102X78XABS (DRAPES) ×3 IMPLANT
DRSG COVADERM 4X14 (GAUZE/BANDAGES/DRESSINGS) ×6 IMPLANT
ELECT BLADE 4.0 EZ CLEAN MEGAD (MISCELLANEOUS) ×4
ELECT REM PT RETURN 9FT ADLT (ELECTROSURGICAL) ×8
ELECTRODE BLDE 4.0 EZ CLN MEGD (MISCELLANEOUS) IMPLANT
ELECTRODE REM PT RTRN 9FT ADLT (ELECTROSURGICAL) ×12 IMPLANT
GLOVE BIO SURGEON STRL SZ 6 (GLOVE) ×2 IMPLANT
GLOVE BIO SURGEON STRL SZ 6.5 (GLOVE) ×3 IMPLANT
GLOVE BIO SURGEON STRL SZ7 (GLOVE) IMPLANT
GLOVE BIO SURGEON STRL SZ7.5 (GLOVE) IMPLANT
GLOVE BIOGEL PI IND STRL 6 (GLOVE) IMPLANT
GLOVE BIOGEL PI IND STRL 6.5 (GLOVE) IMPLANT
GLOVE BIOGEL PI IND STRL 7.0 (GLOVE) IMPLANT
GLOVE BIOGEL PI INDICATOR 6 (GLOVE)
GLOVE BIOGEL PI INDICATOR 6.5 (GLOVE)
GLOVE BIOGEL PI INDICATOR 7.0 (GLOVE)
GLOVE EUDERMIC 7 POWDERFREE (GLOVE) IMPLANT
GLOVE ORTHO TXT STRL SZ7.5 (GLOVE) ×8 IMPLANT
GOWN STRL NON-REIN LRG LVL3 (GOWN DISPOSABLE) ×30 IMPLANT
HEMOSTAT POWDER SURGIFOAM 1G (HEMOSTASIS) ×21 IMPLANT
INSERT FOGARTY 61MM (MISCELLANEOUS) IMPLANT
INSERT FOGARTY XLG (MISCELLANEOUS) ×7 IMPLANT
KIT BASIN OR (CUSTOM PROCEDURE TRAY) ×7 IMPLANT
KIT DILATOR VASC 18G NDL (KITS) ×1 IMPLANT
KIT DRAINAGE VACCUM ASSIST (KITS) ×1 IMPLANT
KIT ROOM TURNOVER OR (KITS) ×7 IMPLANT
KIT SUCTION CATH 14FR (SUCTIONS) ×30 IMPLANT
KIT VASOVIEW W/TROCAR VH 2000 (KITS) ×4 IMPLANT
LEAD PACING MYOCARDI (MISCELLANEOUS) ×4 IMPLANT
LINE VENT (MISCELLANEOUS) ×1 IMPLANT
LOOP VESSEL SUPERMAXI WHITE (MISCELLANEOUS) ×4 IMPLANT
MARKER GRAFT CORONARY BYPASS (MISCELLANEOUS) ×12 IMPLANT
NS IRRIG 1000ML POUR BTL (IV SOLUTION) ×35 IMPLANT
PACK OPEN HEART (CUSTOM PROCEDURE TRAY) ×7 IMPLANT
PAD ARMBOARD 7.5X6 YLW CONV (MISCELLANEOUS) ×11 IMPLANT
PENCIL BUTTON HOLSTER BLD 10FT (ELECTRODE) ×4 IMPLANT
PROBE CRYO2-ABLATION MALLABLE (MISCELLANEOUS) ×1 IMPLANT
PUNCH AORTIC ROTATE 4.0MM (MISCELLANEOUS) IMPLANT
PUNCH AORTIC ROTATE 4.5MM 8IN (MISCELLANEOUS) IMPLANT
PUNCH AORTIC ROTATE 5MM 8IN (MISCELLANEOUS) IMPLANT
SET CARDIOPLEGIA MPS 5001102 (MISCELLANEOUS) ×1 IMPLANT
SET IRRIG TUBING LAPAROSCOPIC (IRRIGATION / IRRIGATOR) ×3 IMPLANT
SOLUTION ANTI FOG 6CC (MISCELLANEOUS) ×1 IMPLANT
SPONGE GAUZE 4X4 12PLY (GAUZE/BANDAGES/DRESSINGS) ×13 IMPLANT
SPONGE LAP 18X18 X RAY DECT (DISPOSABLE) IMPLANT
SPONGE LAP 4X18 X RAY DECT (DISPOSABLE) IMPLANT
SUCKER INTRACARDIAC WEIGHTED (SUCKER) ×3 IMPLANT
SUT BONE WAX W31G (SUTURE) ×7 IMPLANT
SUT ETHIBOND 2 0 SH (SUTURE)
SUT ETHIBOND 2 0 SH 36X2 (SUTURE) ×6 IMPLANT
SUT ETHIBOND X763 2 0 SH 1 (SUTURE) ×15 IMPLANT
SUT MNCRL AB 3-0 PS2 18 (SUTURE) ×14 IMPLANT
SUT MNCRL AB 4-0 PS2 18 (SUTURE) ×1 IMPLANT
SUT PDS AB 1 CTX 36 (SUTURE) ×14 IMPLANT
SUT PROLENE 2 0 SH DA (SUTURE) IMPLANT
SUT PROLENE 3 0 SH 1 (SUTURE) ×4 IMPLANT
SUT PROLENE 3 0 SH DA (SUTURE) ×16 IMPLANT
SUT PROLENE 3 0 SH1 36 (SUTURE) IMPLANT
SUT PROLENE 4 0 RB 1 (SUTURE) ×16
SUT PROLENE 4 0 SH DA (SUTURE) ×8 IMPLANT
SUT PROLENE 4-0 RB1 .5 CRCL 36 (SUTURE) ×6 IMPLANT
SUT PROLENE 5 0 C 1 36 (SUTURE) IMPLANT
SUT PROLENE 6 0 C 1 30 (SUTURE) ×2 IMPLANT
SUT PROLENE 7.0 RB 3 (SUTURE) ×13 IMPLANT
SUT PROLENE 8 0 BV175 6 (SUTURE) ×1 IMPLANT
SUT PROLENE BLUE 7 0 (SUTURE) ×4 IMPLANT
SUT PROLENE POLY MONO (SUTURE) ×1 IMPLANT
SUT SILK  1 MH (SUTURE) ×4
SUT SILK 1 MH (SUTURE) ×6 IMPLANT
SUT STEEL 6MS V (SUTURE) IMPLANT
SUT STEEL STERNAL CCS#1 18IN (SUTURE) ×2 IMPLANT
SUT STEEL SZ 6 DBL 3X14 BALL (SUTURE) ×2 IMPLANT
SUT VIC AB 1 CTX 36 (SUTURE)
SUT VIC AB 1 CTX36XBRD ANBCTR (SUTURE) IMPLANT
SUT VIC AB 2-0 CT1 27 (SUTURE) ×4
SUT VIC AB 2-0 CT1 TAPERPNT 27 (SUTURE) IMPLANT
SUT VIC AB 2-0 CTX 27 (SUTURE) IMPLANT
SUT VIC AB 3-0 SH 27 (SUTURE)
SUT VIC AB 3-0 SH 27X BRD (SUTURE) IMPLANT
SUT VIC AB 3-0 X1 27 (SUTURE) IMPLANT
SUT VICRYL 4-0 PS2 18IN ABS (SUTURE) IMPLANT
SUTURE E-PAK OPEN HEART (SUTURE) ×4 IMPLANT
SYS ATRICLIP LAA EXCLUSION 45 (CLIP) ×1 IMPLANT
SYSTEM SAHARA CHEST DRAIN ATS (WOUND CARE) ×7 IMPLANT
TAPE CLOTH SURG 4X10 WHT LF (GAUZE/BANDAGES/DRESSINGS) ×1 IMPLANT
TOWEL OR 17X24 6PK STRL BLUE (TOWEL DISPOSABLE) ×16 IMPLANT
TOWEL OR 17X26 10 PK STRL BLUE (TOWEL DISPOSABLE) ×16 IMPLANT
TRAY FOLEY IC TEMP SENS 14FR (CATHETERS) ×7 IMPLANT
TUBE SUCT INTRACARD DLP 20F (MISCELLANEOUS) ×4 IMPLANT
TUBING INSUFFLATION 10FT LAP (TUBING) ×4 IMPLANT
UNDERPAD 30X30 INCONTINENT (UNDERPADS AND DIAPERS) ×7 IMPLANT
WATER STERILE IRR 1000ML POUR (IV SOLUTION) ×14 IMPLANT

## 2012-03-06 NOTE — Op Note (Signed)
CARDIOTHORACIC SURGERY OPERATIVE NOTE  Date of Procedure: 03/06/2012  Preoperative Diagnosis:   Severe 3-vessel Coronary Artery Disease  Recurrent Paroxysmal Atrial Fibrillation  Postoperative Diagnosis:   Same  Procedure:    Coronary Artery Bypass Grafting x 3  Left Internal Mammary Artery to Distal Left Anterior Descending Coronary Artery Saphenous Vein Graft to First Obtuse Marginal Branch of Left Circumflex Coronary Artery Saphenous Vein Graft to Distal Right Coronary Artery Endoscopic Vein Harvest from Right Thigh   Maze Procedure  Complete bilateral atrial lesion set using bipolar radiofrequency and cryothermy ablation Clipping of LA appendage  Surgeon: Valentina Gu. Roxy Manns, MD  Assistant: Ellwood Handler, PA-C  Anesthesia: Veatrice Bourbon, MD  Operative Findings:  Mild LV dysfunction, EF estimated 50%  Mild central aortic insufficiency, type I dysfunction  Mild central mitral regurgitation, type I dysfunction  Mildly dilated ascending thoracic aorta, max diameter 4.3 cm  Good quality LIMA and SVG conduit for grafting  Diffuse CAD with relatively poor target vessels for grafting    BRIEF CLINICAL NOTE AND INDICATIONS FOR SURGERY  Patient is an 79 year old gentleman from [place] with known history of coronary artery disease, hypertension, hyperlipidemia, and borderline type 2 diabetes mellitus who has been followed for several years by Dr. Wynonia Lawman. The patient reports having had occasional episodes of substernal chest pain relieved by nitroglycerin in the past. Typically these symptoms occur perhaps once a year at most. His last previous episode occurred in 2012 after which time he underwent cardiac catheterization by Dr. Wynonia Lawman. He was found to have 40% left main coronary artery stenosis, 50% stenosis of the left anterior descending coronary artery, and chronic occlusion of the right coronary artery. He was treated medically. He has done well until 2 days ago when  he developed onset of substernal chest pain occurring at rest it persisted despite administration of sublingual nitroglycerin x3. He had been quite active earlier in the day But had not noticed any particular issues until during the evening when he developed substernal chest pain. He presented to the emergency department where he was found to be in new-onset atrial fibrillation. Apparently he became acutely hypotensive and diaphoretic in the emergency department at which time he underwent prompt DC cardioversion which converted the patient back to sinus rhythm using 1 synchronized 200J shock. Following cardioversion his chest pain was relieved, and electrocardiograms were notable for the absence of acute ST segment elevation. The patient was admitted to the intensive care unit.  Subsequent cardiac enzymes were weakly positive with troponin I 0.37 and CK-MB 5.9.  The patient went back into atrial fibrillation by the following morning and has had some mild intermittent chest discomfort associated with his atrial fibrillation, notably without any significant ST segment changes on electrocardiograms. The patient was taken for urgent diagnostic cardiac catheterization earlier today by Dr. Burt Knack.  The patient was found to have moderate left main coronary stenosis and severe three-vessel coronary artery disease with new high grade 95% stenosis of the mid left anterior descending coronary artery. Left ventricular systolic function appears preserved. Cardiothoracic surgical consultation was requested. The patient has been seen in consultation and counseled at length regarding the indications, risks and potential benefits of surgery.  All questions have been answered, and the patient provides full informed consent for the operation as described.     DETAILS OF THE OPERATIVE PROCEDURE  The patient is brought to the operating room on the above mentioned date and central monitoring was established by the anesthesia team  including placement of  Swan-Ganz catheter and radial arterial line. The patient is placed in the supine position on the operating table.  Intravenous antibiotics are administered. General endotracheal anesthesia is induced uneventfully. A Foley catheter is placed.  Baseline transesophageal echocardiogram was performed.  Findings were notable for mild left ventricular dysfunction.  There is mild central aortic insufficiency and mild central mitral regurgitation. The ascending aorta is mildly dilated.  The patient's chest, abdomen, both groins, and both lower extremities are prepared and draped in a sterile manner. A time out procedure is performed.  A median sternotomy incision was performed and the left internal mammary artery is dissected from the chest wall and prepared for bypass grafting. The left internal mammary artery is notably good quality conduit. Simultaneously, saphenous vein is obtained from the patient's right thigh using endoscopic vein harvest technique. The saphenous vein is notably good quality conduit. After removal of the saphenous vein, the small surgical incisions in the lower extremity are closed with absorbable suture. Following systemic heparinization, the left internal mammary artery was transected distally noted to have excellent flow.  The pericardium is opened. The ascending aorta is somewhat dilated but otherwise normal in appearance. The ascending aorta measures 4.3 cm in its greatest transverse diameter.  The right common femoral vein is cannulated using the Seldinger technique and a guidewire advanced into the right atrium using TEE guidance.  The patient is heparinized systemically and the femoral vein cannulated using a 22 Fr long femoral venous cannula.  The ascending aorta is cannulated for cardiopulmonary bypass.  Adequate heparinization is verified.   A retrograde cardioplegia cannula is placed through the right atrium into the coronary sinus.   The entire pre-bypass  portion of the operation was notable for stable hemodynamics.  Cardiopulmonary bypass was begun and the surface of the heart is inspected.  Distal target vessels are inspected for coronary artery bypass.  A second venous cannula is placed directly into the superior vena cava.   A cardioplegia cannula is placed in the ascending aorta.  A temperature probe was placed in the interventricular septum.  The patient is cooled to 32C systemic temperature.  The aortic cross clamp is applied and cold blood cardioplegia is delivered initially in an antegrade fashion through the aortic root.  Supplemental cardioplegia is given retrograde through the coronary sinus catheter.  Iced saline slush is applied for topical hypothermia.  The initial cardioplegic arrest is rapid with early diastolic arrest.  Repeat doses of cardioplegia are administered intermittently throughout the entire cross clamp portion of the operation through the aortic root, down subsequently placed vein grafts, and through the coronary sinus catheter in order to maintain completely flat electrocardiogram and septal myocardial temperature below 15C.  Myocardial protection was felt to be excellent.  The Atricure bipolar radiofrequency ablation clamp is used for all radiofrequency ablation lesions for the maze procedure.  The heart is retracted towards the surgeon's side and the left sided pulmonary veins exposed.  An elliptical ablation lesion is created around the base of the left sided pulmonary veins.  A similar elliptical lesion was created around the base of the left atrial appendage.  The left atrial appendage was obliterated by deploying a 45 mm Atricure left atrial appendage clip across the appendage.  The heart was replaced into the pericardial sac.  The following distal coronary artery bypass grafts were performed:   The distal right coronary artery was grafted using a reversed saphenous vein graft in an end-to-side fashion.  At the site  of distal  anastomosis the target vessel was poor quality and measured approximately 1.2 mm in diameter.  The first obtuse marginal branch of the left circumflex coronary artery was grafted using a reversed saphenous vein graft in an end-to-side fashion.  At the site of distal anastomosis the target vessel was good quality and measured approximately 2.0 mm in diameter.  The distal left anterior coronary artery was grafted with the left internal mammary artery in an end-to-side fashion.  At the site of distal anastomosis the target vessel was poor quality and measured approximately 1.5 mm in diameter.  A left atriotomy incision was performed through the interatrial groove and extended partially across the back wall of the left atrium after opening the oblique sinus inferiorly.  The floor of the left atrium and the mitral valve were exposed using a self-retaining retractor.  The mitral valve was inspected and notable for normal appearance .    An ablation lesion was placed around the right sided pulmonary veins using the bipolar clamp with one limb of the clamp along the endocardial surface and one along the epicardial surface posteriorly.  A bipolar ablation lesion was placed across the dome of the left atrium from the cephalad apex of the atriotomy incision to reach the cephalad apex of the elliptical lesion around the left sided pulmonary veins.  A similar bipolar lesion was placed across the back wall of the left atrium from the caudad apex of the atriotomy incision to reach the caudad apex of the elliptical lesion around the left sided pulmonary veins, thereby completing a box.  Finally another bipolar lesion was placed across the back wall of the left atrium from the caudad apex of the atriotomy incision towards the posterior mitral valve annulus.  This lesion was completed along the endocardial surface onto the posterior mitral annulus with a 3 minute duration cryothermy lesion, followed by a second  cryothermy lesion along the posterior epicardial surface of the left atrium to the coronary sinus.  This completes the entire left side lesion set of the Cox maze procedure.  The left atriotomy incision is closed using a 2 layer closure of running 3-0 Prolene suture after placing a sump drain across the mitral valve to serve as a left ventricular vent.  All proximal vein graft anastomoses were placed directly to the ascending aorta prior to removal of the aortic cross clamp.  The septal myocardial temperature rose rapidly after reperfusion of the left internal mammary artery graft.  One final dose of warm retrograde "hot shot" cardioplegia was administered retrograde through the coronary sinus catheter while all air was evacuated through the aortic root.  The aortic cross clamp was removed after a total cross clamp time of 116 minutes.  The heart began to be spontaneously without any for cardioversion. The retrograde cardioplegia cannula is removed and the small hole in the right atrium is extended for several centimeters towards the lateral wall of the right atrium. The right side lesion set of the Cox maze procedure is performed using the bipolar radiofrequency ablation clamp to create longitudinal lesion extending from the posterior apex of the right atriotomy incision along the lateral wall the right atrium to reach the lateral wall of the superior vena cava. A second lesion is placed in the opposite direction to reach the lateral wall of the inferior vena cava. A lesion is then placed extending away from the right atriotomy incision along the lateral wall of the right atrium to reach the apex of the right atrial appendage.  One final lesion is performed using cryothermy along the endocardial surface of the right atrium extending from the anterior apex of the right atriotomy incision at the acute margin to reach the tricuspid annulus at the 2 o'clock position. The right atriotomy incision is closed using a 2  layer closure of running 4-0 Prolene suture.  All proximal and distal coronary anastomoses were inspected for hemostasis and appropriate graft orientation. Epicardial pacing wires are fixed to the right ventricular outflow tract and to the right atrial appendage. The patient is rewarmed to 37C temperature. The left ventricular vent is removed. The superior vena cava cannula was removed.  The patient is weaned and disconnected from cardiopulmonary bypass.  The patient's rhythm at separation from bypass was AV paced.  The patient was weaned from cardioplegic bypass without any inotropic support. Total cardiopulmonary bypass time for the operation was 156 minutes.  Followup transesophageal echocardiogram performed after separation from bypass revealed no changes from the preoperative exam.  The aortic cannula was removed uneventfully. Protamine was administered to reverse the anticoagulation. The femoral venous cannula is removed and manual pressure held on the groin for 30 minutes.  The mediastinum and pleural space were inspected for hemostasis and irrigated with saline solution. The mediastinum and the left pleural space were drained using 3 chest tubes placed through separate stab incisions inferiorly.  The soft tissues anterior to the aorta were reapproximated loosely. The sternum is closed with double strength sternal wire. The soft tissues anterior to the sternum were closed in multiple layers and the skin is closed with a running subcuticular skin closure.  The post-bypass portion of the operation was notable for stable rhythm and hemodynamics.  The patient received 2 packs of adult platelets following reversal of heparin using protamine due to thrombocytopenia with coagulopathy and history Plavix treatment preoperatively.  The patient tolerated the procedure well and is transported to the surgical intensive care in stable condition. There are no intraoperative complications. All sponge instrument  and needle counts are verified correct at completion of the operation.    Valentina Gu. Roxy Manns MD 03/06/2012 3:22 PM

## 2012-03-06 NOTE — Progress Notes (Signed)
  Echocardiogram Echocardiogram Transesophageal has been performed.  Mauricio Po 03/06/2012, 9:09 AM

## 2012-03-06 NOTE — Brief Op Note (Addendum)
02/29/2012 - 03/06/2012  12:09 PM  PATIENT:  [name]  79 y.o. male  PRE-OPERATIVE DIAGNOSIS:  CAD  POST-OPERATIVE DIAGNOSIS:  CAD  PROCEDURE:  Procedure(s):  CORONARY ARTERY BYPASS GRAFTING  x3 -LIMA to Distal Left Anterior Descending -SVG to Obtuse Marginal 1 -SVG to Distal Right Coronary Artery  MAZE  -Complete Bi Atrial Lesion Set with RF Ablation and Cryothermy - Clipping of Left Atrial Appendage  INTRAOPERATIVE TRANSESOPHAGEAL ECHOCARDIOGRAM   ENDOVEIN HARVEST OF GREATER SAPHENOUS VEIN RIGHT LEG   SURGEON:    Rexene Alberts, MD  ASSISTANTS:  Ellwood Handler, PA-C  ANESTHESIA:   Veatrice Bourbon, MD  CROSSCLAMP TIME:   116'  CARDIOPULMONARY BYPASS TIME: 156'  FINDINGS:  Mild LV dysfunction, EF estimated 50%  Mild central aortic insufficiency, type I dysfunction  Mild central mitral regurgitation, type I dysfunction  Mildly dilated ascending thoracic aorta, max diameter 4.3 cm  Good quality LIMA and SVG conduit for grafting  Diffuse CAD with relatively poor target vessels for grafting  COMPLICATIONS: none  PATIENT DISPOSITION:   TO SICU IN STABLE CONDITION  OWEN,CLARENCE H 03/06/2012 1:55 PM

## 2012-03-06 NOTE — Progress Notes (Signed)
Called to room by RN due to air leak from ET tube.  ET tube was pulled out to 19.  Tube untaped and advance to 23 and retaped. Pt has equal BBS.  RT will continue to monitor.

## 2012-03-06 NOTE — OR Nursing (Signed)
Dr Roxy Manns scrubbed in @ [phone]

## 2012-03-06 NOTE — Progress Notes (Signed)
Unable to get blood return from [place]. Was able to re-position and flush line with good return. Redressed [place]. Good waveform at this time.

## 2012-03-06 NOTE — Anesthesia Postprocedure Evaluation (Signed)
Anesthesia Post Note  Patient: [name] [name]  Procedure(s) Performed: Procedure(s) (LRB): CORONARY ARTERY BYPASS GRAFTING (CABG) (N/A) MAZE (N/A) INTRAOPERATIVE TRANSESOPHAGEAL ECHOCARDIOGRAM (N/A) ENDOVEIN HARVEST OF GREATER SAPHENOUS VEIN (Right)  Anesthesia type: General  Patient location: ICU  Post pain: Pain level controlled  Post assessment: Post-op Vital signs reviewed  Last Vitals:  Filed Vitals:   03/06/12 0600  BP: 139/76  Pulse: 55  Temp:   Resp: 18    Post vital signs: stable  Level of consciousness: Patient remains intubated per anesthesia plan  Complications: No apparent anesthesia complications

## 2012-03-06 NOTE — Anesthesia Preprocedure Evaluation (Signed)
Anesthesia Evaluation  Patient identified by MRN, date of birth, ID band Patient awake    Reviewed: Allergy & Precautions, H&P , NPO status , Patient's Chart, lab work & pertinent test results, reviewed documented beta blocker date and time   History of Anesthesia Complications Negative for: history of anesthetic complications  Airway Mallampati: II TM Distance: >3 FB Neck ROM: Full    Dental  (+) Teeth Intact   Pulmonary          Cardiovascular hypertension, Pt. on medications and Pt. on home beta blockers + angina with exertion + CAD, + Past MI and + Peripheral Vascular Disease + dysrhythmias Atrial Fibrillation Rhythm:Regular Rate:Normal     Neuro/Psych    GI/Hepatic   Endo/Other    Renal/GU      Musculoskeletal   Abdominal   Peds  Hematology   Anesthesia Other Findings   Reproductive/Obstetrics                           Anesthesia Physical Anesthesia Plan  ASA: III  Anesthesia Plan: General   Post-op Pain Management:    Induction: Intravenous  Airway Management Planned: Oral ETT  Additional Equipment: Arterial line, CVP, PA Cath, TEE and Ultrasound Guidance Line Placement  Intra-op Plan:   Post-operative Plan: Post-operative intubation/ventilation  Informed Consent: I have reviewed the patients History and Physical, chart, labs and discussed the procedure including the risks, benefits and alternatives for the proposed anesthesia with the patient or authorized representative who has indicated his/her understanding and acceptance.   Dental advisory given  Plan Discussed with: Anesthesiologist and Surgeon  Anesthesia Plan Comments:         Anesthesia Quick Evaluation

## 2012-03-06 NOTE — Progress Notes (Signed)
T. CTS p.m. Rounds  Status post combined CABG with Maze procedure Sedated on ventilator hemodynamic stable Atrial pacing for slow nodal rhythm Doing well postop

## 2012-03-06 NOTE — Transfer of Care (Signed)
Immediate Anesthesia Transfer of Care Note  Patient: [name] [name]  Procedure(s) Performed: Procedure(s): CORONARY ARTERY BYPASS GRAFTING (CABG) (N/A) MAZE (N/A) INTRAOPERATIVE TRANSESOPHAGEAL ECHOCARDIOGRAM (N/A) ENDOVEIN HARVEST OF GREATER SAPHENOUS VEIN (Right)  Patient Location: SICU  Anesthesia Type:General  Level of Consciousness: sedated  Airway & Oxygen Therapy: Patient remains intubated per anesthesia plan and Patient placed on Ventilator (see vital sign flow sheet for setting)  Post-op Assessment: Report given to PACU RN and Post -op Vital signs reviewed and stable  Post vital signs: Reviewed and stable  Complications: No apparent anesthesia complications

## 2012-03-06 NOTE — Progress Notes (Signed)
Patient had a copious amount of dark red blood that appeared to be coming from the urethra.  It was mentioned in report that the patient might have had some trauma with foley insertion in the OR.  Patient's urine was amber colored upon admission.  Patient was diuresing 75-300 ml of urine an hour and was gradually changing to more yellow in color.  At 1800, upon removing the bair hugger blanket, I noticed the blood pooled in between the patient's legs.  The upper portion of the R leg compression wrap was also saturated as well as the bed pad.  The right groin dressing was changed with no visible signs of active or old bleeding.  No signs of active bleeding were apparent after cleaning around the catheter and changing the leg dressing and bed linen.  CBC was checked and H/H was stable at 10/29.4.  Will continue to monitor.  Doran Clay, RN

## 2012-03-07 ENCOUNTER — Inpatient Hospital Stay (HOSPITAL_COMMUNITY): Payer: Medicare Other

## 2012-03-07 LAB — POCT I-STAT, CHEM 8
BUN: 17 mg/dL (ref 6–23)
Chloride: 105 mEq/L (ref 96–112)
HCT: 24 % — ABNORMAL LOW (ref 39.0–52.0)
Potassium: 4 mEq/L (ref 3.5–5.1)
Sodium: 137 mEq/L (ref 135–145)

## 2012-03-07 LAB — BASIC METABOLIC PANEL
CO2: 21 mEq/L (ref 19–32)
Chloride: 107 mEq/L (ref 96–112)
GFR calc non Af Amer: 51 mL/min — ABNORMAL LOW (ref >90)
Glucose, Bld: 149 mg/dL — ABNORMAL HIGH (ref 70–99)
Potassium: 4.8 mEq/L (ref 3.5–5.1)
Sodium: 140 mEq/L (ref 135–145)

## 2012-03-07 LAB — PREPARE PLATELET PHERESIS: Unit division: 0

## 2012-03-07 LAB — GLUCOSE, CAPILLARY
Glucose-Capillary: 74 mg/dL (ref 70–99)
Glucose-Capillary: 127 mg/dL — ABNORMAL HIGH (ref 70–99)
Glucose-Capillary: 129 mg/dL — ABNORMAL HIGH (ref 70–99)
Glucose-Capillary: 168 mg/dL — ABNORMAL HIGH (ref 70–99)
Glucose-Capillary: 141 mg/dL — ABNORMAL HIGH (ref 70–99)
Glucose-Capillary: 164 mg/dL — ABNORMAL HIGH (ref 70–99)

## 2012-03-07 LAB — POCT I-STAT 3, ART BLOOD GAS (G3+)
Acid-base deficit: 6 mmol/L — ABNORMAL HIGH (ref 0.0–2.0)
Patient temperature: 37.5
pCO2 arterial: 39.6 mmHg (ref 35.0–45.0)
pH, Arterial: 7.317 — ABNORMAL LOW (ref 7.350–7.450)
pH, Arterial: 7.318 — ABNORMAL LOW (ref 7.350–7.450)

## 2012-03-07 LAB — MAGNESIUM: Magnesium: 2.5 mg/dL (ref 1.5–2.5)

## 2012-03-07 LAB — CBC
HCT: 25.3 % — ABNORMAL LOW (ref 39.0–52.0)
Hemoglobin: 8.8 g/dL — ABNORMAL LOW (ref 13.0–17.0)
Hemoglobin: 8.5 g/dL — ABNORMAL LOW (ref 13.0–17.0)
MCH: 30.7 pg (ref 26.0–34.0)
RBC: 2.8 MIL/uL — ABNORMAL LOW (ref 4.22–5.81)
RBC: 2.77 MIL/uL — ABNORMAL LOW (ref 4.22–5.81)
WBC: 11 10*3/uL — ABNORMAL HIGH (ref 4.0–10.5)

## 2012-03-07 LAB — CREATININE, SERUM
Creatinine, Ser: 1.37 mg/dL — ABNORMAL HIGH (ref 0.50–1.35)
GFR calc non Af Amer: 46 mL/min — ABNORMAL LOW (ref >90)

## 2012-03-07 MED ORDER — WARFARIN SODIUM 2.5 MG PO TABS
2.5000 mg | ORAL_TABLET | Freq: Every day | ORAL | Status: DC
  Administered 2012-03-07 – 2012-03-08 (×2): 2.5 mg via ORAL
  Filled 2012-03-07 (×3): qty 1

## 2012-03-07 MED ORDER — ALBUMIN HUMAN 5 % IV SOLN
INTRAVENOUS | Status: AC
  Filled 2012-03-07: qty 250

## 2012-03-07 MED ORDER — MIDAZOLAM HCL 2 MG/2ML IJ SOLN
2.0000 mg | Freq: Once | INTRAMUSCULAR | Status: AC
  Administered 2012-03-07: 2 mg via INTRAVENOUS
  Filled 2012-03-07: qty 2

## 2012-03-07 MED ORDER — AMIODARONE HCL 200 MG PO TABS
200.0000 mg | ORAL_TABLET | Freq: Two times a day (BID) | ORAL | Status: DC
  Administered 2012-03-07 – 2012-03-13 (×12): 200 mg via ORAL
  Filled 2012-03-07 (×14): qty 1

## 2012-03-07 MED ORDER — EZETIMIBE 10 MG PO TABS
10.0000 mg | ORAL_TABLET | Freq: Every day | ORAL | Status: DC
  Administered 2012-03-07 – 2012-03-13 (×7): 10 mg via ORAL
  Filled 2012-03-07 (×7): qty 1

## 2012-03-07 MED ORDER — INSULIN ASPART 100 UNIT/ML ~~LOC~~ SOLN: 0.0000 [IU] | SUBCUTANEOUS | Status: DC

## 2012-03-07 MED ORDER — FUROSEMIDE 10 MG/ML IJ SOLN
20.0000 mg | Freq: Four times a day (QID) | INTRAMUSCULAR | Status: AC
  Administered 2012-03-07 (×3): 20 mg via INTRAVENOUS
  Filled 2012-03-07: qty 4
  Filled 2012-03-07: qty 2

## 2012-03-07 MED ORDER — COUMADIN BOOK
Freq: Once | Status: AC
  Administered 2012-03-07: 12:00:00
  Filled 2012-03-07: qty 1

## 2012-03-07 MED ORDER — MORPHINE SULFATE 2 MG/ML IJ SOLN: 2.0000 mg | INTRAMUSCULAR | Status: DC | PRN

## 2012-03-07 MED ORDER — WARFARIN - PHYSICIAN DOSING INPATIENT: Freq: Every day | Status: DC

## 2012-03-07 MED ORDER — FUROSEMIDE 40 MG PO TABS
40.0000 mg | ORAL_TABLET | Freq: Two times a day (BID) | ORAL | Status: DC
  Filled 2012-03-07 (×2): qty 1

## 2012-03-07 MED ORDER — ALLOPURINOL 150 MG HALF TABLET
150.0000 mg | ORAL_TABLET | Freq: Every day | ORAL | Status: DC
  Administered 2012-03-07 – 2012-03-13 (×7): 150 mg via ORAL
  Filled 2012-03-07 (×9): qty 1

## 2012-03-07 MED ORDER — POTASSIUM CHLORIDE CRYS ER 20 MEQ PO TBCR
20.0000 meq | EXTENDED_RELEASE_TABLET | Freq: Two times a day (BID) | ORAL | Status: DC
  Administered 2012-03-08 – 2012-03-10 (×5): 20 meq via ORAL
  Filled 2012-03-07 (×7): qty 1

## 2012-03-07 MED ORDER — WARFARIN VIDEO
Freq: Once | Status: AC
  Administered 2012-03-07: 12:00:00

## 2012-03-07 MED ORDER — ATORVASTATIN CALCIUM 40 MG PO TABS
40.0000 mg | ORAL_TABLET | Freq: Every day | ORAL | Status: DC
  Administered 2012-03-07 – 2012-03-12 (×6): 40 mg via ORAL
  Filled 2012-03-07 (×7): qty 1

## 2012-03-07 NOTE — Progress Notes (Signed)
Subjective:  Appreciate Dr. Ricard Dillon work.  CABG x 3 plus Maze. Awake and alert with usual amount of pain.    Objective:  Vital Signs in the last 24 hours: BP 111/57  Pulse 90  Temp(Src) 98.8 F (37.1 C) (Core (Comment))  Resp 11  Ht 6\' 2"  (1.88 m)  Wt 110.9 kg (244 lb 7.8 oz)  BMI 31.38 kg/m2  SpO2 99%  Physical Exam: Pleasant WM n NAD, multiple tubes in place Lungs:  Reduced breath sounds Cardiac:  Regular rhythm, normal S1 and S2, no S3 Extremities: 1+ edema present  Intake/Output from previous day: 03/06 0701 - 03/07 0700 In: 7145 [I.V.:3258; RB:[id number]; NG/GT:60; IV Piggyback:2150] Out: [id number] [Urine:2755; Blood:1700; Chest Tube:540]  Weight Filed Weights   03/05/12 0500 03/06/12 0500 03/07/12 0700  Weight: 109.498 kg (241 lb 6.4 oz) 107.7 kg (237 lb 7 oz) 110.9 kg (244 lb 7.8 oz)    Lab Results: Basic Metabolic Panel:  Recent Labs  03/06/12 0400  03/06/12 2059 03/07/12 0315  NA 136  < > 139 140  K 4.1  < > 5.0 4.8  CL 106  < > 109 107  CO2 26  --   --  21  GLUCOSE 96  < > 117* 149*  BUN 10  < > 10 13  CREATININE 1.31  < > 1.20 1.26  < > = values in this interval not displayed. CBC:  Recent Labs  03/06/12 2040 03/06/12 2059 03/07/12 0315  WBC 10.1  --  11.0*  HGB 9.5* 8.8* 8.8*  HCT 28.0* 26.0* 25.6*  MCV 92.1  --  91.4  PLT 138*  --  136*   Telemetry: Sinus rhythm overnight  Assessment/Plan:  1.Recent CABG doing well 2. Atrial fibrillation currently in NSR following maze 3. Severe 3 VD 4. Post op anemia  Rec:  Neurologically intact.  Doing well.    Kerry Hough  MD [facility]  03/07/2012, 9:51 AM

## 2012-03-07 NOTE — Progress Notes (Signed)
POD # 1 CABG maze  BP 109/66  Pulse 80  Temp(Src) 97.8 F (36.6 C) (Oral)  Resp 22  Ht 6\' 2"  (1.88 m)  Wt 244 lb 7.8 oz (110.9 kg)  BMI 31.38 kg/m2  SpO2 97%   Intake/Output Summary (Last 24 hours) at 03/07/12 1833 Last data filed at 03/07/12 1800  Gross per 24 hour  Intake 2376.81 ml  Output   2505 ml  Net -128.19 ml   k 4.0 hct 24 Creatinine 1.2

## 2012-03-07 NOTE — Progress Notes (Signed)
   CARDIOTHORACIC SURGERY PROGRESS NOTE   R1 Day Post-Op Procedure(s) (LRB): CORONARY ARTERY BYPASS GRAFTING (CABG) (N/A) MAZE (N/A) INTRAOPERATIVE TRANSESOPHAGEAL ECHOCARDIOGRAM (N/A) ENDOVEIN HARVEST OF GREATER SAPHENOUS VEIN (Right)  Subjective: Looks good.  Mild soreness in chest.  No SOB. No nausea.  Objective: Vital signs: BP Readings from Last 1 Encounters:  03/07/12 125/75   Pulse Readings from Last 1 Encounters:  03/07/12 99   Resp Readings from Last 1 Encounters:  03/07/12 20   Temp Readings from Last 1 Encounters:  03/07/12 98.2 F (36.8 C) Core (Comment)    Hemodynamics: PAP: (30-47)/(11-21) 35/19 mmHg CO:  [3.2 L/min-7.2 L/min] 6.5 L/min CI:  [1.4 L/min/m2-3.1 L/min/m2] 2.8 L/min/m2  Physical Exam:  Rhythm:   sinus  Breath sounds: clear  Heart sounds:  RRR  Incisions:  Dressings dry  Abdomen:  soft  Extremities:  warm   Intake/Output from previous day: 03/06 0701 - 03/07 0700 In: [id number] [I.V.:3258; BJ:[id number]; NG/GT:60; IV Piggyback:2150] Out: [id number] [Urine:2755; Blood:1700; Chest Tube:540] Intake/Output this shift: Total I/O In: 370.8 [P.O.:120; I.V.:250.8] Out: 260 [Urine:210; Chest Tube:50]  Lab Results:  Recent Labs  03/06/12 2040 03/06/12 2059 03/07/12 0315  WBC 10.1  --  11.0*  HGB 9.5* 8.8* 8.8*  HCT 28.0* 26.0* 25.6*  PLT 138*  --  136*   BMET:  Recent Labs  03/06/12 0400  03/06/12 2059 03/07/12 0315  NA 136  < > 139 140  K 4.1  < > 5.0 4.8  CL 106  < > 109 107  CO2 26  --   --  21  GLUCOSE 96  < > 117* 149*  BUN 10  < > 10 13  CREATININE 1.31  < > 1.20 1.26  CALCIUM 8.7  --   --  7.6*  < > = values in this interval not displayed.  CBG (last 3)   Recent Labs  03/06/12 2321 03/07/12 0331 03/07/12 0725  GLUCAP 127* 129* 168*   ABG    Component Value Date/Time   PHART 7.317* 03/07/2012 0111   HCO3 20.2 03/07/2012 0111   TCO2 21 03/07/2012 0111   ACIDBASEDEF 5.0* 03/07/2012 0111   O2SAT 95.0 03/07/2012 0111    CXR: *RADIOLOGY REPORT*  Clinical Data: Coronary artery bypass graft.  PORTABLE CHEST - 1 VIEW  Comparison: Multiple recent previous exams.  Findings: 0609 hours. Endotracheal and NG tubes have been removed  in the interval. The right IJ pulmonary artery catheter is again  noted in the right main pulmonary artery. Left chest tube persists  without evidence for left-sided pneumothorax. Midline drain likely  related to the mediastinum or pericardium. The tip of a probable  second drain is seen over the lower left heart. Left atrial  appendage occluder device noted.  No overt edema or focal airspace consolidation. No substantial  pleural effusion.  IMPRESSION:  Interval extubation and NG tube removal.  Otherwise stable.  Original Report Authenticated By: Misty Stanley, M.D.   Assessment/Plan: S/P Procedure(s) (LRB): CORONARY ARTERY BYPASS GRAFTING (CABG) (N/A) MAZE (N/A) INTRAOPERATIVE TRANSESOPHAGEAL ECHOCARDIOGRAM (N/A) ENDOVEIN HARVEST OF GREATER SAPHENOUS VEIN (Right)  Doing well POD1 Expected post op acute blood loss anemia, mild, stable Expected post op volume excess, mild Maintaining NSR so far   Mobilize  D/C tubes and lines  Diuresis  Restart amiodarone  Start coumadin slowly     OWEN,CLARENCE H 03/07/2012 10:27 AM

## 2012-03-07 NOTE — Procedures (Signed)
Extubation Procedure Note  Patient Details:   Name: [name] DOB: 1926/05/05 MRN: [id number]    Evaluation  O2 sats: stable throughout Complications: No apparent complications Patient did tolerate procedure well. Bilateral Breath Sounds: Clear   Pt extubated per protocol. NIF -30, FVC 1.1L. Pt able to breath around cuff. Pt extubated to 4L [place] with no complications. No stridor present. Pt able to vocalize and oriented to place. Doroteo Bradford 03/07/2012, 0005

## 2012-03-08 ENCOUNTER — Inpatient Hospital Stay (HOSPITAL_COMMUNITY): Payer: Medicare Other

## 2012-03-08 LAB — BASIC METABOLIC PANEL
CO2: 25 mEq/L (ref 19–32)
Calcium: 8.4 mg/dL (ref 8.4–10.5)
GFR calc Af Amer: 43 mL/min — ABNORMAL LOW (ref >90)
GFR calc non Af Amer: 37 mL/min — ABNORMAL LOW (ref >90)
GFR calc non Af Amer: 37 mL/min — ABNORMAL LOW (ref >90)
Glucose, Bld: 112 mg/dL — ABNORMAL HIGH (ref 70–99)
Glucose, Bld: 118 mg/dL — ABNORMAL HIGH (ref 70–99)
Potassium: 4 mEq/L (ref 3.5–5.1)
Sodium: 136 mEq/L (ref 135–145)
Sodium: 135 mEq/L (ref 135–145)

## 2012-03-08 LAB — PROTIME-INR: Prothrombin Time: 16.1 seconds — ABNORMAL HIGH (ref 11.6–15.2)

## 2012-03-08 LAB — GLUCOSE, CAPILLARY
Glucose-Capillary: 121 mg/dL — ABNORMAL HIGH (ref 70–99)
Glucose-Capillary: 116 mg/dL — ABNORMAL HIGH (ref 70–99)

## 2012-03-08 LAB — CBC
Hemoglobin: 8.1 g/dL — ABNORMAL LOW (ref 13.0–17.0)
MCH: 31.5 pg (ref 26.0–34.0)
MCV: 91.8 fL (ref 78.0–100.0)
RBC: 2.57 MIL/uL — ABNORMAL LOW (ref 4.22–5.81)
WBC: 10.4 10*3/uL (ref 4.0–10.5)

## 2012-03-08 MED ORDER — FUROSEMIDE 40 MG PO TABS
40.0000 mg | ORAL_TABLET | Freq: Two times a day (BID) | ORAL | Status: DC
  Administered 2012-03-08 – 2012-03-10 (×4): 40 mg via ORAL
  Filled 2012-03-08 (×6): qty 1

## 2012-03-08 MED ORDER — FUROSEMIDE 10 MG/ML IJ SOLN
40.0000 mg | Freq: Once | INTRAMUSCULAR | Status: AC
  Administered 2012-03-08: 40 mg via INTRAVENOUS

## 2012-03-08 NOTE — Progress Notes (Signed)
2 Days Post-Op Procedure(s) (LRB): CORONARY ARTERY BYPASS GRAFTING (CABG) (N/A) MAZE (N/A) INTRAOPERATIVE TRANSESOPHAGEAL ECHOCARDIOGRAM (N/A) ENDOVEIN HARVEST OF GREATER SAPHENOUS VEIN (Right) Subjective: Some incisional pain, but overall feels well  Objective: Vital signs in last 24 hours: Temp:  [97.8 F (36.6 C)-98.6 F (37 C)] 97.8 F (36.6 C) (03/08 0717) Pulse Rate:  [71-99] 90 (03/08 0700) Cardiac Rhythm:  [-] Normal sinus rhythm (03/08 0700) Resp:  [10-25] 25 (03/08 0700) BP: (79-125)/(49-75) 103/63 mmHg (03/08 0700) SpO2:  [94 %-99 %] 99 % (03/08 0700) Arterial Line BP: (91-142)/(38-65) 130/43 mmHg (03/07 1700) Weight:  [244 lb 0.8 oz (110.7 kg)] 244 lb 0.8 oz (110.7 kg) (03/08 0500)  Hemodynamic parameters for last 24 hours: PAP: (35-37)/(19-20) 35/20 mmHg  Intake/Output from previous day: 03/07 0701 - 03/08 0700 In: 1382.6 [P.O.:480; I.V.:802.6; IV Piggyback:100] Out: 2095 [Urine:1995; Chest Tube:100] Intake/Output this shift:    General appearance: alert and no distress Neurologic: intact Heart: regular rate and rhythm Lungs: diminished breath sounds bibasilar Abdomen: normal findings: bowel sounds normal and soft, non-tender  Lab Results:  Recent Labs  03/07/12 1600 03/07/12 1610 03/08/12 0420  WBC 10.4  --  10.4  HGB 8.5* 8.2* 8.1*  HCT 25.3* 24.0* 23.6*  PLT 124*  --  116*   BMET:  Recent Labs  03/07/12 0315  03/07/12 1610 03/08/12 0420  NA 140  --  137 136  K 4.8  --  4.0 4.0  CL 107  --  105 103  CO2 21  --   --  25  GLUCOSE 149*  --  158* 112*  BUN 13  --  17 22  CREATININE 1.26  < > 1.20 1.66*  CALCIUM 7.6*  --   --  8.1*  < > = values in this interval not displayed.  PT/INR:  Recent Labs  03/08/12 0420  LABPROT 16.1*  INR 1.32   ABG    Component Value Date/Time   PHART 7.317* 03/07/2012 0111   HCO3 20.2 03/07/2012 0111   TCO2 21 03/07/2012 1610   ACIDBASEDEF 5.0* 03/07/2012 0111   O2SAT 95.0 03/07/2012 0111   CBG (last 3)    Recent Labs  03/07/12 1205 03/07/12 1530 03/07/12 1920  GLUCAP 192* 141* 164*    Assessment/Plan: S/P Procedure(s) (LRB): CORONARY ARTERY BYPASS GRAFTING (CABG) (N/A) MAZE (N/A) INTRAOPERATIVE TRANSESOPHAGEAL ECHOCARDIOGRAM (N/A) ENDOVEIN HARVEST OF GREATER SAPHENOUS VEIN (Right) POD # 2 CABG, maze CV- maintaining SR  BP remains relatively low  RESP- pulmonary venous congestion on CXR- diurese   RENAL- creatinine up this AM to 1.66- ercheck this afternoon  Keep foley for now  Volume overloaded- diurese  CBG moderately well controlled  Anemia secondary to ABL- stable, follow  Mobilize   LOS: 8 days    HENDRICKSON,STEVEN C 03/08/2012

## 2012-03-08 NOTE — Progress Notes (Signed)
Up in chair  BP 101/84  Pulse 89  Temp(Src) 98.5 F (36.9 C) (Oral)  Resp 20  Ht 6\' 2"  (1.88 m)  Wt 244 lb 0.8 oz (110.7 kg)  BMI 31.32 kg/m2  SpO2 98%   Intake/Output Summary (Last 24 hours) at 03/08/12 1636 Last data filed at 03/08/12 1300  Gross per 24 hour  Intake   1090 ml  Output   2580 ml  Net  -1490 ml    Good response to Lasix  BMET pending

## 2012-03-09 ENCOUNTER — Inpatient Hospital Stay (HOSPITAL_COMMUNITY): Payer: Medicare Other

## 2012-03-09 LAB — BASIC METABOLIC PANEL
CO2: 27 mEq/L (ref 19–32)
Chloride: 101 mEq/L (ref 96–112)
Glucose, Bld: 105 mg/dL — ABNORMAL HIGH (ref 70–99)
Sodium: 137 mEq/L (ref 135–145)

## 2012-03-09 LAB — CBC
Hemoglobin: 7.5 g/dL — ABNORMAL LOW (ref 13.0–17.0)
MCV: 91.6 fL (ref 78.0–100.0)
Platelets: 118 10*3/uL — ABNORMAL LOW (ref 150–400)
RBC: 2.37 MIL/uL — ABNORMAL LOW (ref 4.22–5.81)
WBC: 9.1 10*3/uL (ref 4.0–10.5)

## 2012-03-09 LAB — GLUCOSE, CAPILLARY
Glucose-Capillary: 102 mg/dL — ABNORMAL HIGH (ref 70–99)
Glucose-Capillary: 96 mg/dL (ref 70–99)

## 2012-03-09 LAB — PROTIME-INR: Prothrombin Time: 15.2 seconds (ref 11.6–15.2)

## 2012-03-09 MED ORDER — WARFARIN SODIUM 5 MG PO TABS
5.0000 mg | ORAL_TABLET | Freq: Every day | ORAL | Status: DC
  Administered 2012-03-09: 5 mg via ORAL
  Filled 2012-03-09 (×2): qty 1

## 2012-03-09 NOTE — Progress Notes (Signed)
Notes reviewed. Please call if needed. Will continue to monitor.

## 2012-03-09 NOTE — Progress Notes (Addendum)
3 Days Post-Op Procedure(s) (LRB): CORONARY ARTERY BYPASS GRAFTING (CABG) (N/A) MAZE (N/A) INTRAOPERATIVE TRANSESOPHAGEAL ECHOCARDIOGRAM (N/A) ENDOVEIN HARVEST OF GREATER SAPHENOUS VEIN (Right) Subjective:  Patient states he has some abdominal pain this morning.  He also states he feels run down and tires easily.  He is ambulating with assistance.   Objective: Vital signs in last 24 hours: Temp:  [97.7 F (36.5 C)-98.6 F (37 C)] 98.2 F (36.8 C) (03/09 0724) Pulse Rate:  [71-92] 82 (03/09 0700) Cardiac Rhythm:  [-] Normal sinus rhythm (03/09 0600) Resp:  [12-21] 21 (03/09 0700) BP: (94-138)/(49-84) 114/52 mmHg (03/09 0700) SpO2:  [94 %-100 %] 99 % (03/09 0700) Weight:  [240 lb 4.8 oz (109 kg)] 240 lb 4.8 oz (109 kg) (03/09 0500)  Intake/Output from previous day: 03/08 0701 - 03/09 0700 In: 1140 [P.O.:720; I.V.:420] Out: 2625 [Urine:2625]  General appearance: alert, cooperative and no distress Heart: regular rate and rhythm Lungs: diminished breath sounds left base Abdomen: soft, non-tender; bowel sounds normal; no masses,  no organomegaly Extremities: edema 1-2+ Wound: clean and dry  Lab Results:  Recent Labs  03/08/12 0420 03/09/12 0430  WBC 10.4 9.1  HGB 8.1* 7.5*  HCT 23.6* 21.7*  PLT 116* 118*   BMET:  Recent Labs  03/08/12 1620 03/09/12 0430  NA 135 137  K 3.9 3.8  CL 100 101  CO2 25 27  GLUCOSE 118* 105*  BUN 23 24*  CREATININE 1.64* 1.60*  CALCIUM 8.4 8.0*    PT/INR:  Recent Labs  03/09/12 0430  LABPROT 15.2  INR 1.22   ABG    Component Value Date/Time   PHART 7.317* 03/07/2012 0111   HCO3 20.2 03/07/2012 0111   TCO2 21 03/07/2012 1610   ACIDBASEDEF 5.0* 03/07/2012 0111   O2SAT 95.0 03/07/2012 0111   CBG (last 3)   Recent Labs  03/08/12 1923 03/08/12 2352 03/09/12 0722  GLUCAP 116* 96 109*    Assessment/Plan: S/P Procedure(s) (LRB): CORONARY ARTERY BYPASS GRAFTING (CABG) (N/A) MAZE (N/A) INTRAOPERATIVE TRANSESOPHAGEAL  ECHOCARDIOGRAM (N/A) ENDOVEIN HARVEST OF GREATER SAPHENOUS VEIN (Right)  1. CV- NSR rate controlled, still some episodes of hypotension- on Lopressor and Amiodarone 2. Pulm- improvement of Left pleural effusion, off oxygen but desats with ambulation, continue IS 3. Renal- creatinine is stable, good U/O, remains volume overloaded continue diuresis 4. Expected post operative blood loss anemia- Hgb 7.5 this morning, may benefit from transfusion will discuss with staff 5. INR 1.22 this morning, will increase coumadin to 5mg  daily 6. CBGs- sugars pretty well controlled, will d/c insulin and fingersticks 7. Dispo- patient stable off all drips, will speak with staff regarding transfusion, possibly remove foley ? Transfer to 2000   LOS: 9 days    BARRETT, ERIN 03/09/2012  1900 Feels well this evening Ambulated 3x today Dc foley and central line

## 2012-03-10 ENCOUNTER — Encounter (HOSPITAL_COMMUNITY): Payer: Self-pay | Admitting: Thoracic Surgery (Cardiothoracic Vascular Surgery)

## 2012-03-10 LAB — CBC
HCT: 24.1 % — ABNORMAL LOW (ref 39.0–52.0)
Hemoglobin: 8.2 g/dL — ABNORMAL LOW (ref 13.0–17.0)
MCH: 31.1 pg (ref 26.0–34.0)
MCHC: 34 g/dL (ref 30.0–36.0)
MCV: 91.3 fL (ref 78.0–100.0)
RDW: 15.1 % (ref 11.5–15.5)

## 2012-03-10 LAB — BASIC METABOLIC PANEL
BUN: 24 mg/dL — ABNORMAL HIGH (ref 6–23)
Creatinine, Ser: 1.52 mg/dL — ABNORMAL HIGH (ref 0.50–1.35)
GFR calc Af Amer: 47 mL/min — ABNORMAL LOW (ref >90)
GFR calc non Af Amer: 41 mL/min — ABNORMAL LOW (ref >90)
Glucose, Bld: 114 mg/dL — ABNORMAL HIGH (ref 70–99)
Potassium: 3.9 mEq/L (ref 3.5–5.1)

## 2012-03-10 MED ORDER — MOVING RIGHT ALONG BOOK
Freq: Once | Status: AC
  Administered 2012-03-13: 11:00:00
  Filled 2012-03-10: qty 1

## 2012-03-10 MED ORDER — RIVAROXABAN 10 MG PO TABS
10.0000 mg | ORAL_TABLET | Freq: Every day | ORAL | Status: DC
  Administered 2012-03-11 – 2012-03-12 (×2): 10 mg via ORAL
  Filled 2012-03-10 (×3): qty 1

## 2012-03-10 MED ORDER — ASPIRIN EC 81 MG PO TBEC
81.0000 mg | DELAYED_RELEASE_TABLET | Freq: Every day | ORAL | Status: DC
  Administered 2012-03-10 – 2012-03-13 (×4): 81 mg via ORAL
  Filled 2012-03-10 (×4): qty 1

## 2012-03-10 MED ORDER — ALBUMIN HUMAN 5 % IV SOLN
12.5000 g | Freq: Once | INTRAVENOUS | Status: DC
  Filled 2012-03-10: qty 250

## 2012-03-10 MED ORDER — POTASSIUM CHLORIDE CRYS ER 20 MEQ PO TBCR: 20.0000 meq | EXTENDED_RELEASE_TABLET | Freq: Two times a day (BID) | ORAL | Status: DC

## 2012-03-10 MED ORDER — SODIUM CHLORIDE 0.9 % IV SOLN: 250.0000 mL | INTRAVENOUS | Status: DC | PRN

## 2012-03-10 MED ORDER — WARFARIN SODIUM 2.5 MG PO TABS
2.5000 mg | ORAL_TABLET | Freq: Every day | ORAL | Status: DC
  Filled 2012-03-10: qty 1

## 2012-03-10 MED ORDER — FUROSEMIDE 40 MG PO TABS: 40.0000 mg | ORAL_TABLET | Freq: Two times a day (BID) | ORAL | Status: DC

## 2012-03-10 MED ORDER — ATENOLOL 25 MG PO TABS: 25.0000 mg | ORAL_TABLET | Freq: Every day | ORAL | Status: DC

## 2012-03-10 MED ORDER — ATENOLOL 25 MG PO TABS
25.0000 mg | ORAL_TABLET | Freq: Two times a day (BID) | ORAL | Status: DC
  Administered 2012-03-10: 25 mg via ORAL
  Filled 2012-03-10 (×2): qty 1

## 2012-03-10 MED ORDER — SODIUM CHLORIDE 0.9 % IJ SOLN
3.0000 mL | Freq: Two times a day (BID) | INTRAMUSCULAR | Status: DC
  Administered 2012-03-10 – 2012-03-12 (×6): 3 mL via INTRAVENOUS

## 2012-03-10 MED ORDER — TRAMADOL HCL 50 MG PO TABS
50.0000 mg | ORAL_TABLET | ORAL | Status: DC | PRN
  Administered 2012-03-10: 50 mg via ORAL
  Filled 2012-03-10: qty 2

## 2012-03-10 MED ORDER — SODIUM CHLORIDE 0.9 % IJ SOLN: 3.0000 mL | INTRAMUSCULAR | Status: DC | PRN

## 2012-03-10 MED FILL — Potassium Chloride Inj 2 mEq/ML: INTRAVENOUS | Qty: 40 | Status: AC

## 2012-03-10 MED FILL — Magnesium Sulfate Inj 50%: INTRAMUSCULAR | Qty: 10 | Status: AC

## 2012-03-10 NOTE — Progress Notes (Signed)
TCTS BRIEF PROGRESS NOTE   Mr Weideman is feeling better BP [id number] systolic and rhythm stable w/out receiving any fluids ECHO looks good with only very small pericardial effusion Will continue to hold lasix tonight and monitor  OWEN,CLARENCE H 03/10/2012 6:53 PM

## 2012-03-10 NOTE — Progress Notes (Addendum)
TCTS BRIEF PROGRESS NOTE   New onset orthostatic symptoms this afternoon w/ BP 80/40, maintaining NSR Will get STAT ECHO to r/o pericardial effusion/tamponade following pacing wire removal Will hold lasix, potassium and beta blocker and give a little volume Monitor closely  [name],[name] 03/10/2012 5:00 PM

## 2012-03-10 NOTE — Progress Notes (Signed)
Pt having episodes of dizziness, BP soft throughout day. Pt was being assisted to bathroom and felt dizzy, pt's knees buckled and was assisted to the floor by RN. Pt has no c/o pain from fall, wife at bedside. CN, NT, and RN assisted pt to chair, v/s taken, BP low. Dr. Roxy Manns notified, albumin ordered, but held after pts BP resolved to 102/60 with no dizziness or other symptoms. Pt in bed resting, will continue to monitor closely.

## 2012-03-10 NOTE — Progress Notes (Signed)
Temporary pacing wires removed per MD order;  per second RN, Carver Fila.  VSS. INR WNL. No cardiac arrhthymias noted. Patient tolerated well. Sterile drsg applied.  No acute distress noted.

## 2012-03-10 NOTE — Progress Notes (Signed)
Subjective:  Awake and alert, not SOB.  Mild chest pain.  Objective:  Vital Signs in the last 24 hours: BP 110/52  Pulse 80  Temp(Src) 98.6 F (37 C) (Oral)  Resp 18  Ht 6\' 2"  (1.88 m)  Wt 106.6 kg (235 lb 0.2 oz)  BMI 30.16 kg/m2  SpO2 97%  Physical Exam: Pleasant WM in NAD sitting in chair Lungs:  Reduced breath sounds Cardiac:  Regular rhythm, normal S1 and S2, no S3, 1-2/6 murmur over aortic valve Extremities: 1+ edema present  Intake/Output from previous day: 03/09 0701 - 03/10 0700 In: 1320 [P.O.:1320] Out: 2310 [Urine:2310]  Weight Filed Weights   03/08/12 0500 03/09/12 0500 03/10/12 0753  Weight: 110.7 kg (244 lb 0.8 oz) 109 kg (240 lb 4.8 oz) 106.6 kg (235 lb 0.2 oz)    Lab Results: Basic Metabolic Panel:  Recent Labs  03/09/12 0430 03/10/12 0605  NA 137 136  K 3.8 3.9  CL 101 98  CO2 27 30  GLUCOSE 105* 114*  BUN 24* 24*  CREATININE 1.60* 1.52*   CBC:  Recent Labs  03/09/12 0430 03/10/12 0605  WBC 9.1 8.7  HGB 7.5* 8.2*  HCT 21.7* 24.1*  MCV 91.6 91.3  PLT 118* 143*   Telemetry: Sinus rhythm overnight  Assessment/Plan:  1.Recent CABG doing well 2. Atrial fibrillation currently in NSR following maze 3. Severe 3 VD 4. Post op anemia  Rec:  DIscussed possible aniticoagulant with Dr. Roxy Manns. Warfarin versus Xarelto.  If OK with him, a factor 1`0 agent may be a better choice with lifestyle.  He will need a dose reduction for renal though.   Kerry Hough  MD [facility]  03/10/2012, 9:42 AM

## 2012-03-10 NOTE — Progress Notes (Signed)
Pt had walked x2 today however on BSC when I arrived. Pt asked for assist to get back to bed. No BM results. Pt dizzy upon standing. BP on EOB 82/42. Got cold cloth and assisted lying back in bed. Pt c/o less dizziness but increased clamminess. BP 80/40. Gave pt water and notified RN. Wife with pt. Will f/u in am. [id number] Josephina Shih [place] CES, ACSM 03/10/2012 3:20 PM

## 2012-03-10 NOTE — Progress Notes (Signed)
  Echocardiogram 2D Echocardiogram has been performed.  [name] [name] 03/10/2012, 5:49 PM

## 2012-03-10 NOTE — Progress Notes (Addendum)
   CARDIOTHORACIC SURGERY PROGRESS NOTE  4 Days Post-Op  S/P Procedure(s) (LRB): CORONARY ARTERY BYPASS GRAFTING (CABG) (N/A) MAZE (N/A) INTRAOPERATIVE TRANSESOPHAGEAL ECHOCARDIOGRAM (N/A) ENDOVEIN HARVEST OF GREATER SAPHENOUS VEIN (Right)  Subjective: Looks good.  No complaints.  Appetite improving.  Ambulating well w/ minimal assistance  Objective: Vital signs in last 24 hours: Temp:  [97.9 F (36.6 C)-98.6 F (37 C)] 98.6 F (37 C) (03/10 0828) Pulse Rate:  [67-88] 80 (03/10 0800) Cardiac Rhythm:  [-] Normal sinus rhythm (03/10 0753) Resp:  [11-25] 18 (03/10 0800) BP: (88-137)/(49-102) 110/52 mmHg (03/10 0800) SpO2:  [95 %-100 %] 97 % (03/10 0800) Weight:  [106.6 kg (235 lb 0.2 oz)] 106.6 kg (235 lb 0.2 oz) (03/10 0753)  Physical Exam:  Rhythm:   sinus  Breath sounds: clear  Heart sounds:  RRR  Incisions:  Clean and dry  Abdomen:  soft  Extremities:  warm   Intake/Output from previous day: 03/09 0701 - 03/10 0700 In: 1320 [P.O.:1320] Out: 2310 [Urine:2310] Intake/Output this shift:    Lab Results:  Recent Labs  03/09/12 0430 03/10/12 0605  WBC 9.1 8.7  HGB 7.5* 8.2*  HCT 21.7* 24.1*  PLT 118* 143*   BMET:  Recent Labs  03/09/12 0430 03/10/12 0605  NA 137 136  K 3.8 3.9  CL 101 98  CO2 27 30  GLUCOSE 105* 114*  BUN 24* 24*  CREATININE 1.60* 1.52*  CALCIUM 8.0* 8.2*    CBG (last 3)   Recent Labs  03/09/12 1142 03/09/12 1530 03/10/12 0346  GLUCAP 102* 112* 120*   PT/INR:   Recent Labs  03/10/12 0605  LABPROT 17.8*  INR 1.51*    CXR:  N/A  Assessment/Plan: S/P Procedure(s) (LRB): CORONARY ARTERY BYPASS GRAFTING (CABG) (N/A) MAZE (N/A) INTRAOPERATIVE TRANSESOPHAGEAL ECHOCARDIOGRAM (N/A) ENDOVEIN HARVEST OF GREATER SAPHENOUS VEIN (Right)  Doing well POD4 Maintaining NSR Expected post op acute blood loss anemia, mild, improved Expected post op volume excess, mild, diuresing Renal function  stable   Mobilize  Diuresis  D/C pacing wires  Transfer 2000  Possible d/c home 1-2 days  Will stop coumadin and start Xarelto per Dr Wynonia Lawman, dose adjusted for renal function    Darnel Mchan H 03/10/2012 9:38 AM

## 2012-03-10 NOTE — Progress Notes (Signed)
Pt transferred from 2300, VSS, pt resting with wife at bedside. Will continue to monitor.

## 2012-03-10 NOTE — Progress Notes (Signed)
1500  Pt has walked twice already today. We will follow up tomorrow. Graylon Good RN,BSN

## 2012-03-11 ENCOUNTER — Inpatient Hospital Stay (HOSPITAL_COMMUNITY): Payer: Medicare Other

## 2012-03-11 LAB — CBC
MCH: 31.3 pg (ref 26.0–34.0)
Platelets: 159 10*3/uL (ref 150–400)
RBC: 2.49 MIL/uL — ABNORMAL LOW (ref 4.22–5.81)
WBC: 8 10*3/uL (ref 4.0–10.5)

## 2012-03-11 LAB — BASIC METABOLIC PANEL
CO2: 27 mEq/L (ref 19–32)
Calcium: 8.1 mg/dL — ABNORMAL LOW (ref 8.4–10.5)
Chloride: 101 mEq/L (ref 96–112)
Potassium: 3.7 mEq/L (ref 3.5–5.1)
Sodium: 137 mEq/L (ref 135–145)

## 2012-03-11 MED ORDER — AMIODARONE HCL 200 MG PO TABS: 200.0000 mg | ORAL_TABLET | Freq: Two times a day (BID) | ORAL | Status: DC

## 2012-03-11 MED ORDER — RIVAROXABAN 15 MG PO TABS: 15.0000 mg | ORAL_TABLET | Freq: Every day | ORAL | Status: DC

## 2012-03-11 MED ORDER — DIPHENHYDRAMINE HCL 25 MG PO CAPS
ORAL_CAPSULE | ORAL | Status: AC
  Filled 2012-03-11: qty 1

## 2012-03-11 MED ORDER — DIPHENHYDRAMINE HCL 25 MG PO CAPS
25.0000 mg | ORAL_CAPSULE | Freq: Four times a day (QID) | ORAL | Status: DC | PRN
  Administered 2012-03-11 – 2012-03-12 (×4): 25 mg via ORAL
  Filled 2012-03-11 (×3): qty 1

## 2012-03-11 MED ORDER — OXYCODONE HCL 5 MG PO TABS: 5.0000 mg | ORAL_TABLET | ORAL | Status: DC | PRN

## 2012-03-11 MED ORDER — POTASSIUM CHLORIDE CRYS ER 20 MEQ PO TBCR
40.0000 meq | EXTENDED_RELEASE_TABLET | Freq: Every day | ORAL | Status: DC
  Administered 2012-03-12 – 2012-03-13 (×2): 40 meq via ORAL
  Filled 2012-03-11 (×3): qty 2

## 2012-03-11 MED ORDER — LACTULOSE 10 GM/15ML PO SOLN
20.0000 g | Freq: Every day | ORAL | Status: DC
  Filled 2012-03-11 (×3): qty 30

## 2012-03-11 MED ORDER — RIVAROXABAN 10 MG PO TABS: 10.0000 mg | ORAL_TABLET | Freq: Every day | ORAL | Status: DC

## 2012-03-11 MED ORDER — POTASSIUM CHLORIDE CRYS ER 20 MEQ PO TBCR
40.0000 meq | EXTENDED_RELEASE_TABLET | Freq: Once | ORAL | Status: AC
  Administered 2012-03-11: 40 meq via ORAL
  Filled 2012-03-11: qty 2

## 2012-03-11 MED ORDER — FUROSEMIDE 40 MG PO TABS
40.0000 mg | ORAL_TABLET | Freq: Every day | ORAL | Status: DC
  Administered 2012-03-11 – 2012-03-13 (×3): 40 mg via ORAL
  Filled 2012-03-11 (×3): qty 1

## 2012-03-11 MED ORDER — ATENOLOL 25 MG PO TABS
25.0000 mg | ORAL_TABLET | Freq: Two times a day (BID) | ORAL | Status: DC
  Administered 2012-03-11 – 2012-03-13 (×4): 25 mg via ORAL
  Filled 2012-03-11 (×6): qty 1

## 2012-03-11 MED ORDER — POTASSIUM CHLORIDE CRYS ER 20 MEQ PO TBCR: 20.0000 meq | EXTENDED_RELEASE_TABLET | Freq: Every day | ORAL | Status: DC

## 2012-03-11 MED ORDER — POTASSIUM CHLORIDE CRYS ER 20 MEQ PO TBCR
20.0000 meq | EXTENDED_RELEASE_TABLET | Freq: Every day | ORAL | Status: DC
  Administered 2012-03-12 – 2012-03-13 (×2): 20 meq via ORAL
  Filled 2012-03-11 (×2): qty 1

## 2012-03-11 MED FILL — Sodium Chloride Irrigation Soln 0.9%: Qty: 3000 | Status: AC

## 2012-03-11 MED FILL — Electrolyte-R (PH 7.4) Solution: INTRAVENOUS | Qty: 4000 | Status: AC

## 2012-03-11 MED FILL — Sodium Chloride IV Soln 0.9%: INTRAVENOUS | Qty: 1000 | Status: AC

## 2012-03-11 MED FILL — Heparin Sodium (Porcine) Inj 1000 Unit/ML: INTRAMUSCULAR | Qty: 10 | Status: AC

## 2012-03-11 MED FILL — Lidocaine HCl IV Inj 20 MG/ML: INTRAVENOUS | Qty: 5 | Status: AC

## 2012-03-11 MED FILL — Mannitol IV Soln 20%: INTRAVENOUS | Qty: 500 | Status: AC

## 2012-03-11 MED FILL — Sodium Bicarbonate IV Soln 8.4%: INTRAVENOUS | Qty: 50 | Status: AC

## 2012-03-11 MED FILL — Heparin Sodium (Porcine) Inj 1000 Unit/ML: INTRAMUSCULAR | Qty: 30 | Status: AC

## 2012-03-11 NOTE — Discharge Summary (Signed)
Physician Discharge Summary  Patient ID: [name] [name] MRN: [id number] DOB/AGE: 77/15/1928 77 y.o.  Admit date: 02/29/2012 Discharge date: 03/11/2012  Admission Diagnoses:  Patient Active Problem List  Diagnosis  . CAD (coronary artery disease)  . Hyperlipidemia  . Obesity (BMI 30-39.9)  . Hypertension  . Chronic venous insufficiency  . Gout  . BPH (benign prostatic hypertrophy)  . NSTEMI (non-ST elevated myocardial infarction)  . Atrial fibrillation  . Acute systolic heart failure   Discharge Diagnoses:   Patient Active Problem List  Diagnosis  . CAD (coronary artery disease)  . Hyperlipidemia  . Obesity (BMI 30-39.9)  . Hypertension  . Chronic venous insufficiency  . Gout  . BPH (benign prostatic hypertrophy)  . NSTEMI (non-ST elevated myocardial infarction)  . Atrial fibrillation  . Acute systolic heart failure  . S/P CABG x 3  . S/P Maze operation for atrial fibrillation   Discharged Condition: good  History of Present Illness:   Mr. Bohning is a 32 white male with known history of CAD, HTN, Hyperlipidemia, and DM.  The patient has been routinely followed by Dr. Wynonia Lawman.  The patient has had occasional episodes of substernal chest pain which is relieved by NTG.  The patient stated these happen maybe once a year.  His first cardiac catheterization was performed in 2012 at which time he was found to have some mild disease of this LM and LAD and a chronic total occlusion of the RCA.  This was chosen to be medically managed.  Since that time the patient has done well.  However 2 days prior to his admission the patient developed substernal chest pain at rest.  He took SL Nitroglycerin which did not provide any relief.  He presented to the Emergency Department at which time he was found to be in Atrial Fibrillation.  He developed hypotension and diaphoresis and was cardioverted at that time.  EKG changes were absent and his cardiac enzymes were positive.  The patient was admitted  for further observation.   Hospital Course:   HD#1 the patient again developed Atrial Fibrillation.  This was associated with chest pain but EKG was free from ST changes.  He was taken urgently to the catheterization lab which showed moderate LM involvement and high grade stenosis of his LAD.   LV function was preserved and it was felt the patient would benefit from coronary bypass surgery.  TCTS was consulted and the patient was evaluated by Dr. Roxy Manns on 03/02/2012 at which time it was felt the patient would be a candidate for bypass surgery with a concomitant MAZE procedure.  The risks and benefits were explained to the patient and he was agreeable to proceed.  However the patient was on Plavix prior to admission and his platelet function would need to stabilize prior to proceeding with surgery.  The patient maintained NSR prior to surgery and was chest pain free.  He was taken to the operating room on 03/06/2012.  He underwent a complete MAZE procedure and CABG x 3 utilizing LIMA to LAD, SVG to OM1, and SVG to distal RCA.  He also underwent Endoscopic Saphenous vein harvest from the right leg.  He tolerated the procedure well and was taken to the SICU in stable condition.  He developed some copious blood due to his foley insertion.  The patient was extubated early the day after surgery.  During his stay in the ICU he was weaned off all cardiac drips.  His chest tubes and arterial lines were  removed without difficulty.  He remained volume overloaded and was diuresed with Lasix.  Once the patient was medically stable he was transferred to the step down unit in stable condition.  The patient is doing well.  He is maintaining NSR since his MAZE procedure.  He has been evaluated by his Primary Cardiologist who recommended the patient be started on Xarelto instead of Coumadin.  I spoke with Pharmacy and they recommended the patient be placed on 15 mg daily which is more effective and is okay with the patient's current  renal function.  The patient's pacing wires have been removed.  The patient developed some bradycardia and orthostatic hypotension.  He has been taken off his Lasix and Beta blocker and this has improved.  He is ambulating without difficulty and tolerating a full diet.  Should he continue to progress we anticipate discharge in the next 24-48 hours.       Consults: cardiology  Significant Diagnostic Studies: angiography:   Hemodynamics:  AO 92/59 mean 73  LV 108/12  Coronary angiography:  Coronary dominance: right  Left mainstem: The left mainstem is heavily calcified. The ostial left mainstem has mild nonobstructive stenosis. The distal left mainstem has a least 50% to 70% stenosis appreciated best in the steep RAO cranial view  Left anterior descending (LAD): The LAD is severely calcified. The proximal vessel has diffuse nonobstructive stenosis. The mid vessel has critical 95% stenosis with associated hypodensity and marked irregularity. The distal vessel has mild diffuse stenosis. The first diagonal is patent with mild nonobstructive stenosis. The second diagonal is small with a tight ostial stenosis of 75-80%.  Left circumflex (LCx): The left circumflex is very large in caliber. The proximal vessel was calcified. There is nonobstructive disease into the first OM branch which is a large branch vessel. Just beyond the first OM and the AV circumflex there is hypodensity in 50-70% stenosis. The circumflex extends down the AV groove and supplies 2 more obtuse marginal branches with diffuse nonobstructive disease.  Right coronary artery (RCA): The RCA is totally occluded in the proximal aspect. There is a bridging collateral with antegrade flow into the mid and distal RCA. The distal RCA cell branches also fill from left to right collaterals. The appearance of the right coronary artery is not significantly changed from prior CAT studies.  Left ventriculography: Left ventricular systolic function is  normal, LVEF is estimated at 55-65%, there is mild mitral regurgitation   Treatments: surgery:   Coronary Artery Bypass Grafting x 3  Left Internal Mammary Artery to Distal Left Anterior Descending Coronary Artery  Saphenous Vein Graft to First Obtuse Marginal Branch of Left Circumflex Coronary Artery  Saphenous Vein Graft to Distal Right Coronary Artery  Endoscopic Vein Harvest from Right Thigh  Maze Procedure  Complete bilateral atrial lesion set using bipolar radiofrequency and cryothermy ablation  Clipping of LA appendage  Disposition: 01-Home or Self Care  The patient has been discharged on:   1.Beta Blocker:  Yes [   ]                              No   [ x  ]                              If No, reason: Bradycardia, Hypotension  2.Ace Inhibitor/ARB: Yes [x   ]  No  [    ]                                     If No, reason: Elevated Creatinine  3.Statin:   Yes [  x ]                  No  [   ]                  If No, reason:  4.Ecasa:  Yes  [x   ]                  No   [   ]                  If No, reason:      Future Appointments Provider Department Dept Phone   04/07/2012 1:00 PM Rexene Alberts, MD [facility] [phone]       Medication List    STOP taking these medications       atenolol 50 MG tablet  Commonly known as:  TENORMIN     benazepril 10 MG tablet  Commonly known as:  LOTENSIN     clopidogrel 75 MG tablet  Commonly known as:  PLAVIX     furosemide 40 MG tablet  Commonly known as:  LASIX     isosorbide mononitrate 120 MG 24 hr tablet  Commonly known as:  IMDUR      TAKE these medications       allopurinol 300 MG tablet  Commonly known as:  ZYLOPRIM  Take 150 mg by mouth Daily.     amiodarone 200 MG tablet  Commonly known as:  PACERONE  Take 1 tablet (200 mg total) by mouth 2 (two) times daily after a meal.     aspirin 81 MG tablet  Take 81 mg by mouth  daily.     cholecalciferol 1000 UNITS tablet  Commonly known as:  VITAMIN D  Take 1,000 Units by mouth daily.     CRESTOR 20 MG tablet  Generic drug:  rosuvastatin  Take 20 mg by mouth Daily.     EPIPEN 2-PAK 0.3 mg/0.3 mL Devi  Generic drug:  EPINEPHrine  Inject 0.3 mg into the muscle See admin instructions. For severe allergic reaction     fish oil-omega-3 fatty acids 1000 MG capsule  Take 1 g by mouth daily.     galantamine 8 MG 24 hr capsule  Commonly known as:  RAZADYNE ER  Take 8 mg by mouth Daily.     NITROSTAT 0.4 MG SL tablet  Generic drug:  nitroGLYCERIN  Place 1 tablet under the tongue every 5 (five) minutes x 3 doses as needed for chest pain.     oxyCODONE 5 MG immediate release tablet  Commonly known as:  Oxy IR/ROXICODONE  Take 1-2 tablets (5-10 mg total) by mouth every 4 (four) hours as needed.     psyllium 58.6 % packet  Commonly known as:  METAMUCIL  Take 1 packet by mouth daily.     Rivaroxaban 15 MG Tabs tablet  Commonly known as:  XARELTO  Take 1 tablet (15 mg total) by mouth daily with supper.     vitamin B-12 1000 MCG tablet  Commonly known as:  CYANOCOBALAMIN  Take 500 mcg by mouth daily.     cyanocobalamin  1000 MCG/ML injection  Commonly known as:  (VITAMIN B-12)  Inject 1,000 mcg into the muscle See admin instructions. Every three weeks     ZETIA 10 MG tablet  Generic drug:  ezetimibe  Take 10 mg by mouth Daily.           Follow-up Information   Follow up with Rexene Alberts, MD On 04/07/2012. (Appointment is at 1:00 pm)    Contact information:   [address] [place] [address] [phone]       Follow up with [facility] On 04/07/2012. (Please get CXR 1 hour prior to your appointment)    Contact information:   [place]       Schedule an appointment as soon as possible for a visit with TILLEY JR,W SPENCER, MD. (Please contact office to set up 2-4 week appointment)    Contact information:   [address]  [place] [address] [phone]       Signed: Ellwood Handler 03/11/2012, 9:36 AM

## 2012-03-11 NOTE — Progress Notes (Signed)
Patient evaluated for long-term disease management services with [facility] as a benefit of his [facility] insurance Patient will receive a post discharge transition of care call and will be evaluated for monthly home visits for assessments and for education.      Linden Dolin, RN,BSN, [facility], [phone]

## 2012-03-11 NOTE — Progress Notes (Signed)
CARDIAC REHAB PHASE I   PRE:  Rate/Rhythm: 120 A fib   BP:  Supine:   Sitting: 100/60  Standing: 100/50   SaO2: 96 RA  MODE:  Ambulation: 208 ft   POST:  Rate/Rhythm: 126  BP:  Supine:   Sitting: 104/60  Standing:    SaO2: 96 RA 1150-1210  On arrival pt in recliner without c/o . He states that he has been up in room and in bathroom, denies any dizziness. Pt HR 120's afib on arrival. Took pt's BP prior to walk sitting and standing stable. Pt denies any dizziness when standing. Assisted X 1 and used walker to ambulate. Gait steady with walker. Pt states that he feels very tired today. He was only able to walk 208 feet with two rest stops. Pt exhausted by end of walk. States that he felt better yesterday. HR after walk 126. Discussed HR and BP with RN. Pt back to recliner after walk with call light in reach.  Rodney Langton RN 03/11/2012 12:15 PM

## 2012-03-11 NOTE — Progress Notes (Signed)
Subjective:  Awake and alert, not SOB. He Had episode of mild hypotension yesterday and resolved. ECHO with small pericardial effusion.   Objective:  Vital Signs in the last 24 hours: BP 125/66  Pulse 64  Temp(Src) 97 F (36.1 C) (Oral)  Resp 16  Ht 6\' 2"  (1.88 m)  Wt 105.87 kg (233 lb 6.4 oz)  BMI 29.95 kg/m2  SpO2 95%  Physical Exam: Pleasant WM in NAD sitting in chair Lungs:  Reduced breath sounds Cardiac:  Regular rhythm, normal S1 and S2, no S3, 1-2/6 murmur over aortic valve Extremities: 1+ edema present  Intake/Output from previous day: 03/10 0701 - 03/11 0700 In: 435 [P.O.:435] Out: 500 [Urine:500]  Weight Filed Weights   03/09/12 0500 03/10/12 0753 03/11/12 0224  Weight: 109 kg (240 lb 4.8 oz) 106.6 kg (235 lb 0.2 oz) 105.87 kg (233 lb 6.4 oz)    Lab Results: Basic Metabolic Panel:  Recent Labs  03/10/12 0605 03/11/12 0550  NA 136 137  K 3.9 3.7  CL 98 101  CO2 30 27  GLUCOSE 114* 106*  BUN 24* 29*  CREATININE 1.52* 1.54*   CBC:  Recent Labs  03/10/12 0605 03/11/12 0550  WBC 8.7 8.0  HGB 8.2* 7.8*  HCT 24.1* 22.8*  MCV 91.3 91.6  PLT 143* 159   Telemetry: Some recurrence of atrial fibrillation  Assessment/Plan:  1.Recent CABG doing well 2. Atrial fibrillation, some recurrence following maze 3. Severe 3 VD 4. Post op anemia  5. Stage 3 CKD   6.  Dizziness resolved  Rec:  Doing better today and asking about discharge.    Kerry Hough  MD [facility]  03/11/2012, 1:29 PM

## 2012-03-11 NOTE — Progress Notes (Addendum)
5 Days Post-Op Procedure(s) (LRB): CORONARY ARTERY BYPASS GRAFTING (CABG) (N/A) MAZE (N/A) INTRAOPERATIVE TRANSESOPHAGEAL ECHOCARDIOGRAM (N/A) ENDOVEIN HARVEST OF GREATER SAPHENOUS VEIN (Right) Subjective:  [name] [name] is feeling better this morning.  He has had no further episodes of dizziness.  He is ambulating  No BM  Objective: Vital signs in last 24 hours: Temp:  [97 F (36.1 C)-98.6 F (37 C)] 97 F (36.1 C) (03/11 0701) Pulse Rate:  [60-86] 64 (03/11 0701) Cardiac Rhythm:  [-] Normal sinus rhythm (03/10 1405) Resp:  [15-21] 16 (03/11 0701) BP: (88-125)/(49-66) 125/66 mmHg (03/11 0701) SpO2:  [90 %-100 %] 95 % (03/11 0726) Weight:  [233 lb 6.4 oz (105.87 kg)-235 lb 0.2 oz (106.6 kg)] 233 lb 6.4 oz (105.87 kg) (03/11 0224)   Intake/Output from previous day: 03/10 0701 - 03/11 0700 In: 435 [P.O.:435] Out: 500 [Urine:500]  General appearance: alert, cooperative and no distress Heart: regular rate and rhythm Lungs: clear to auscultation bilaterally Abdomen: soft, non-tender; bowel sounds normal; no masses,  no organomegaly Extremities: edema trace Wound: clean and dry  Lab Results:  Recent Labs  03/10/12 0605 03/11/12 0550  WBC 8.7 8.0  HGB 8.2* 7.8*  HCT 24.1* 22.8*  PLT 143* 159   BMET:  Recent Labs  03/10/12 0605 03/11/12 0550  NA 136 137  K 3.9 3.7  CL 98 101  CO2 30 27  GLUCOSE 114* 106*  BUN 24* 29*  CREATININE 1.52* 1.54*  CALCIUM 8.2* 8.1*    PT/INR:  Recent Labs  03/10/12 0605  LABPROT 17.8*  INR 1.51*   ABG    Component Value Date/Time   PHART 7.317* 03/07/2012 0111   HCO3 20.2 03/07/2012 0111   TCO2 21 03/07/2012 1610   ACIDBASEDEF 5.0* 03/07/2012 0111   O2SAT 95.0 03/07/2012 0111   CBG (last 3)   Recent Labs  03/09/12 1142 03/09/12 1530 03/10/12 0346  GLUCAP 102* 112* 120*    Assessment/Plan: S/P Procedure(s) (LRB): CORONARY ARTERY BYPASS GRAFTING (CABG) (N/A) MAZE (N/A) INTRAOPERATIVE TRANSESOPHAGEAL ECHOCARDIOGRAM  (N/A) ENDOVEIN HARVEST OF GREATER SAPHENOUS VEIN (Right)  1. CV- NSR, bradycardic, pressure improved, on Amiodarone, continue to hold Lopressor 2. Pulm- no acute issues, CXR no significant effusion, continue IS 3. Expected blood loss anemia- Hgb remains low at 7.8, but is stable 4. GI- constipation, will order Lactulose prn 5. Dispo- patient is doing better today, will monitor today if no further hypotension, possibly D/C in AM   LOS: 11 days    [name], [name] 03/11/2012  I have seen and examined the patient and agree with the assessment and plan as outlined.  [name],[name] 03/11/2012 8:23 AM

## 2012-03-12 MED ORDER — ATENOLOL 25 MG PO TABS: 25.0000 mg | ORAL_TABLET | Freq: Every day | ORAL | Status: DC

## 2012-03-12 NOTE — Progress Notes (Signed)
Spoke with patient at bedside to explain services and to have consents signed. [name] [name] states he is not sure if he wants to participate with [facility]. Did not want to sign consents at this time and wanted to think about it. Asked that he call if he changes his mind in the future to call. Left [facility] Care Management packet and consents for review.  Marthenia Rolling, MSN-Ed, RN,BSN, [facility], [phone]

## 2012-03-12 NOTE — Progress Notes (Signed)
CARDIAC REHAB PHASE I   PRE:  Rate/Rhythm: 63 sR    BP: sitting 102/60    SaO2: 94 RA  MODE:  Ambulation: 410 ft   POST:  Rate/Rhythm: 74    BP: sitting 120/60     SaO2: 95 RA  Pt still feeling dizzy and weak, exerted with walking. Had to sit and rest at 120 ft, BP 123/57. Rested atleast 5 min but dizziness continued. Able to walk further, needed x3 more standing rests. Exhausted after walk. BP increased, no apparent afib. Will continue to follow. Pt progressing slowly. [id number]  [name] [name] [place] CES, ACSM 03/12/2012 11:57 AM

## 2012-03-12 NOTE — Progress Notes (Signed)
Subjective:  Awake and alert, not SOB.Still weak when walks. Objective:  Vital Signs in the last 24 hours: BP 121/55  Pulse 67  Temp(Src) 97.2 F (36.2 C) (Oral)  Resp 18  Ht 6\' 2"  (1.88 m)  Wt 105.189 kg (231 lb 14.4 oz)  BMI 29.76 kg/m2  SpO2 100%  Physical Exam: Pleasant WM in NAD sitting in chair Lungs:  Reduced breath sounds Cardiac:  Regular rhythm, normal S1 and S2, no S3, 1-2/6 murmur over aortic valve Extremities: 1+ edema present  Intake/Output from previous day: 03/11 0701 - 03/12 0700 In: 720 [P.O.:720] Out: 1303 [Urine:1300; Stool:3]  Weight Filed Weights   03/10/12 0753 03/11/12 0224 03/12/12 0449  Weight: 106.6 kg (235 lb 0.2 oz) 105.87 kg (233 lb 6.4 oz) 105.189 kg (231 lb 14.4 oz)    Lab Results: Basic Metabolic Panel:  Recent Labs  03/10/12 0605 03/11/12 0550  NA 136 137  K 3.9 3.7  CL 98 101  CO2 30 27  GLUCOSE 114* 106*  BUN 24* 29*  CREATININE 1.52* 1.54*   CBC:  Recent Labs  03/10/12 0605 03/11/12 0550  WBC 8.7 8.0  HGB 8.2* 7.8*  HCT 24.1* 22.8*  MCV 91.3 91.6  PLT 143* 159   Telemetry: Currently NSR  Assessment/Plan:  1.Recent CABG doing well 2. Atrial fibrillation, some recurrence following maze 3. Severe 3 VD 4. Post op anemia  5. Stage 3 CKD   6.  Dizziness intermittently   Rec:  Some of weakness may be due to anemia?  Could he benefit from transfusion?   Kerry Hough  MD [facility]  03/12/2012, 3:04 PM

## 2012-03-12 NOTE — Progress Notes (Addendum)
[address]       [place] [address]             [phone]    6 Days Post-Op  Procedure(s) (LRB): CORONARY ARTERY BYPASS GRAFTING (CABG) (N/A) MAZE (N/A) INTRAOPERATIVE TRANSESOPHAGEAL ECHOCARDIOGRAM (N/A) ENDOVEIN HARVEST OF GREATER SAPHENOUS VEIN (Right) Subjective: Feels ok, getting stronger   Objective  Telemetry sinus rhythm   Temp:  [98 F (36.7 C)-98.3 F (36.8 C)] 98 F (36.7 C) (03/12 0449) Pulse Rate:  [59-113] 59 (03/12 0449) Resp:  [18-20] 20 (03/12 0449) BP: (101-141)/(51-77) 122/51 mmHg (03/12 0449) SpO2:  [98 %-100 %] 100 % (03/12 0449) Weight:  [231 lb 14.4 oz (105.189 kg)] 231 lb 14.4 oz (105.189 kg) (03/12 0449)   Intake/Output Summary (Last 24 hours) at 03/12/12 1025 Last data filed at 03/12/12 0843  Gross per 24 hour  Intake    960 ml  Output   1301 ml  Net   -341 ml       General appearance: alert, cooperative and no distress Heart: regular rate and rhythm Lungs: mildly dim L base Abdomen: mild distension, + BS, soft nontender Extremities: + LE edema Wound: incis healing well  Lab Results:  Recent Labs  03/10/12 0605 03/11/12 0550  NA 136 137  K 3.9 3.7  CL 98 101  CO2 30 27  GLUCOSE 114* 106*  BUN 24* 29*  CREATININE 1.52* 1.54*  CALCIUM 8.2* 8.1*   No results found for this basename: AST, ALT, ALKPHOS, BILITOT, PROT, ALBUMIN,  in the last 72 hours No results found for this basename: LIPASE, AMYLASE,  in the last 72 hours  Recent Labs  03/10/12 0605 03/11/12 0550  WBC 8.7 8.0  HGB 8.2* 7.8*  HCT 24.1* 22.8*  MCV 91.3 91.6  PLT 143* 159   No results found for this basename: CKTOTAL, CKMB, TROPONINI,  in the last 72 hours No components found with this basename: POCBNP,  No results found for this basename: DDIMER,  in the last 72 hours No results found for this basename: HGBA1C,  in the last 72 hours No results found for this basename: CHOL, HDL, LDLCALC, TRIG, CHOLHDL,  in the last 72 hours No  results found for this basename: TSH, T4TOTAL, FREET3, T3FREE, THYROIDAB,  in the last 72 hours No results found for this basename: VITAMINB12, FOLATE, FERRITIN, TIBC, IRON, RETICCTPCT,  in the last 72 hours  Medications: Scheduled . albumin human  12.5 g Intravenous Once  . allopurinol  150 mg Oral Daily  . amiodarone  200 mg Oral BID PC  . aspirin EC  81 mg Oral Daily  . atenolol  25 mg Oral BID  . atorvastatin  40 mg Oral q1800  . bisacodyl  10 mg Oral Daily   Or  . bisacodyl  10 mg Rectal Daily  . docusate sodium  200 mg Oral Daily  . ezetimibe  10 mg Oral Daily  . furosemide  40 mg Oral Daily  . lactulose  20 g Oral Daily  . moving right along book   Does not apply Once  . pantoprazole  40 mg Oral Daily  . potassium chloride  20 mEq Oral Daily  . potassium chloride  40 mEq Oral Daily  . rivaroxaban  10 mg Oral Q supper  . sodium chloride  3 mL Intravenous Q12H     Radiology/Studies:  Dg Chest 2 View  03/11/2012  *RADIOLOGY REPORT*  Clinical Data: Post CABG, some chest pain at  incision site  CHEST - 2 VIEW  Comparison: Portable chest x-ray of 03/09/2012  Findings: Aeration of the lungs is unchanged and there is basilar atelectasis with small effusions left greater than right. Cardiomegaly is stable.  Median sternotomy sutures are noted with left atrial appendage exclusion device present.  IMPRESSION: Small effusions and mild basilar atelectasis remain.   Original Report Authenticated By: Ivar Drape, M.D.     INR: Will add last result for INR, ABG once components are confirmed Will add last 4 CBG results once components are confirmed  Assessment/Plan: S/P Procedure(s) (LRB): CORONARY ARTERY BYPASS GRAFTING (CABG) (N/A) MAZE (N/A) INTRAOPERATIVE TRANSESOPHAGEAL ECHOCARDIOGRAM (N/A) ENDOVEIN HARVEST OF GREATER SAPHENOUS VEIN (Right)  1 doing well, cont to push rehab 2 BP controlled 3 prob D/C in am    LOS: 12 days    GOLD,WAYNE E 3/12/201410:25 AM  I have seen  and examined the patient and agree with the assessment and plan as outlined.  Possibly d/c home tomorrow if rhythm stable and he continues to progress.  OWEN,CLARENCE H 03/12/2012 5:11 PM

## 2012-03-12 NOTE — Progress Notes (Signed)
Pt ambulated 300 feet with rolling walker and RN; pt sat down in wheelchair at 300 feet and was pushed in wheelchair back to room; pt c/o dizziness during entire walk; pt assisted back to bed; will cont. To monitor.

## 2012-03-12 NOTE — Progress Notes (Signed)
Pt given 25 mg PO Benadryl for itchy rash on back; will cont. To monitor.

## 2012-03-13 ENCOUNTER — Encounter (HOSPITAL_COMMUNITY): Payer: Self-pay | Admitting: Thoracic Surgery (Cardiothoracic Vascular Surgery)

## 2012-03-13 DIAGNOSIS — I77810 Thoracic aortic ectasia: Secondary | ICD-10-CM

## 2012-03-13 HISTORY — DX: Thoracic aortic ectasia: I77.810

## 2012-03-13 NOTE — Care Management Note (Addendum)
    Page 1 of 2   03/13/2012     3:04:14 PM   CARE MANAGEMENT NOTE 03/13/2012  Patient:  ESA, SANDERLIN   Account Number:  [id number]  Date Initiated:  03/05/2012  Documentation initiated by:  MAYO,HENRIETTA  Subjective/Objective Assessment:   79 yr-old male adm with dx of AFib w/RVR, CAD; lives with spouse, independent PTA     Action/Plan:   WILL FOLLOW FOR HOME NEEDS AS PT PROGRESSES.   Anticipated DC Date:  03/13/2012   Anticipated DC Plan:  [place]  CM consult      Choice offered to / List presented to:     DME arranged  [place]      DME agency  [place].        Status of service:  Completed, signed off Medicare Important Message given?   (If response is "NO", the following Medicare IM given date fields will be blank) Date Medicare IM given:   Date Additional Medicare IM given:    Discharge Disposition:  HOME/SELF CARE  Per UR Regulation:  Reviewed for med. necessity/level of care/duration of stay  If discussed at [place] of Stay Meetings, dates discussed:   03/06/2012  03/11/2012    Comments:  03/13/12 Sidni Fusco,RN,BSN [id number] PT [place].  REQUESTS RW AND SHOWER CHAIR FOR HOME.  03/10/12 [place] RN BSN MSN CCM Pt doing well, ambulated x 3 yesterday, x 2 today.  Weaned from drips and O2.  03/07/12 [place] RN BSN MSN CCM Extubated, on 2L/min [place], dopamine/neosynephrine gtts.  03/05/12 [place] BSN MSN CCM Pt scheduled for CABG tomorrow, indicates he has adequate information about surgical procedure/recovery.  Pt will have 24/7 assistance when discharged.

## 2012-03-13 NOTE — Progress Notes (Signed)
D/C CTS per order and protocol, steris applied. (EPW partial suture also still in skin - I removed that as well)  Will continue to monitor.

## 2012-03-13 NOTE — Progress Notes (Signed)
[id number] Completed discharge education with pt and wife. They voice understanding. Pt agrees to [facility]. CRP in [place], will send referral.

## 2012-03-13 NOTE — Progress Notes (Addendum)
7 Days Post-Op Procedure(s) (LRB): CORONARY ARTERY BYPASS GRAFTING (CABG) (N/A) MAZE (N/A) INTRAOPERATIVE TRANSESOPHAGEAL ECHOCARDIOGRAM (N/A) ENDOVEIN HARVEST OF GREATER SAPHENOUS VEIN (Right) Subjective:  [name] has no complaints this morning.  He states he is ready to go home.  Objective: Vital signs in last 24 hours: Temp:  [97.2 F (36.2 C)-98 F (36.7 C)] 97.9 F (36.6 C) (03/13 0558) Pulse Rate:  [60-82] 82 (03/13 0558) Cardiac Rhythm:  [-] Normal sinus rhythm;Bundle branch block (03/12 2009) Resp:  [18] 18 (03/13 0558) BP: (98-124)/(50-74) 124/74 mmHg (03/13 0558) SpO2:  [98 %-100 %] 98 % (03/13 0558)  Intake/Output from previous day: 03/12 0701 - 03/13 0700 In: 1560 [P.O.:1560] Out: 2250 [Urine:2250]  General appearance: alert, cooperative and no distress Neurologic: intact Heart: regular rate and rhythm Lungs: clear to auscultation bilaterally Abdomen: soft, non-tender; bowel sounds normal; no masses,  no organomegaly Extremities: edema 1+ Wound: clean and dry  Lab Results:  Recent Labs  03/11/12 0550  WBC 8.0  HGB 7.8*  HCT 22.8*  PLT 159   BMET:  Recent Labs  03/11/12 0550  NA 137  K 3.7  CL 101  CO2 27  GLUCOSE 106*  BUN 29*  CREATININE 1.54*  CALCIUM 8.1*    PT/INR: No results found for this basename: LABPROT, INR,  in the last 72 hours ABG    Component Value Date/Time   PHART 7.317* 03/07/2012 0111   HCO3 20.2 03/07/2012 0111   TCO2 21 03/07/2012 1610   ACIDBASEDEF 5.0* 03/07/2012 0111   O2SAT 95.0 03/07/2012 0111   CBG (last 3)  No results found for this basename: GLUCAP,  in the last 72 hours  Assessment/Plan: S/P Procedure(s) (LRB): CORONARY ARTERY BYPASS GRAFTING (CABG) (N/A) MAZE (N/A) INTRAOPERATIVE TRANSESOPHAGEAL ECHOCARDIOGRAM (N/A) ENDOVEIN HARVEST OF GREATER SAPHENOUS VEIN (Right)  1. CV- NSR, HR and blood pressure improved- on Amiodarone and Atenolol 2. Dispo- patient is medically stable, will d/c home today   LOS: 13  days    BARRETT, ERIN 03/13/2012  I have seen and examined the patient and agree with the assessment and plan as outlined.  He feels well and wants to go home today.  Will plan non-contrast CT scan of chest in 6 months to follow up Mr Cisek's mild aneurysmal dilatation of the ascending thoracic aorta.  OWEN,CLARENCE H 03/13/2012 10:42 AM

## 2012-03-13 NOTE — Progress Notes (Signed)
Subjective:  Still weak. Mild SOB.  In sinus  Objective:  Vital Signs in the last 24 hours: BP 124/74  Pulse 82  Temp(Src) 97.9 F (36.6 C) (Oral)  Resp 18  Ht 6\' 2"  (1.88 m)  Wt 105.189 kg (231 lb 14.4 oz)  BMI 29.76 kg/m2  SpO2 98%  Physical Exam: Pleasant WM in NAD sitting in chair Lungs:  Reduced breath sounds Cardiac:  Regular rhythm, normal S1 and S2, no S3, 1-2/6 murmur over aortic valve Extremities: 1+ edema present  Intake/Output from previous day: 03/12 0701 - 03/13 0700 In: 1560 [P.O.:1560] Out: 2250 [Urine:2250]  Weight Filed Weights   03/10/12 0753 03/11/12 0224 03/12/12 0449  Weight: 106.6 kg (235 lb 0.2 oz) 105.87 kg (233 lb 6.4 oz) 105.189 kg (231 lb 14.4 oz)    Lab Results: Basic Metabolic Panel:  Recent Labs  03/11/12 0550  NA 137  K 3.7  CL 101  CO2 27  GLUCOSE 106*  BUN 29*  CREATININE 1.54*   CBC:  Recent Labs  03/11/12 0550  WBC 8.0  HGB 7.8*  HCT 22.8*  MCV 91.6  PLT 159   Telemetry: Currently NSR  Assessment/Plan:  1.Recent CABG doing well 2. Atrial fibrillation, some recurrence following maze 3. Severe 3 VD 4. Post op anemia  5. Stage 3 CKD   6.  Dizziness intermittently   Rec:  Home today per surgeons.   Kerry Hough  MD [facility]  03/13/2012, 9:49 AM

## 2012-03-18 ENCOUNTER — Telehealth: Payer: Self-pay

## 2012-03-22 ENCOUNTER — Emergency Department (HOSPITAL_COMMUNITY): Payer: Medicare Other

## 2012-03-22 ENCOUNTER — Inpatient Hospital Stay (HOSPITAL_COMMUNITY)
Admission: EM | Admit: 2012-03-22 | Discharge: 2012-03-25 | DRG: 418 | Disposition: A | Discharge status: Home / Self Care | Payer: Medicare Other | Attending: Internal Medicine | Admitting: Internal Medicine

## 2012-03-22 ENCOUNTER — Encounter (HOSPITAL_COMMUNITY): Payer: Self-pay | Admitting: *Deleted

## 2012-03-22 DIAGNOSIS — K819 Cholecystitis, unspecified: Secondary | ICD-10-CM

## 2012-03-22 DIAGNOSIS — Z87891 Personal history of nicotine dependence: Secondary | ICD-10-CM

## 2012-03-22 DIAGNOSIS — M109 Gout, unspecified: Secondary | ICD-10-CM | POA: Diagnosis present

## 2012-03-22 DIAGNOSIS — G4733 Obstructive sleep apnea (adult) (pediatric): Secondary | ICD-10-CM | POA: Diagnosis present

## 2012-03-22 DIAGNOSIS — I10 Essential (primary) hypertension: Secondary | ICD-10-CM | POA: Diagnosis present

## 2012-03-22 DIAGNOSIS — K8010 Calculus of gallbladder with chronic cholecystitis without obstruction: Secondary | ICD-10-CM

## 2012-03-22 DIAGNOSIS — Z01810 Encounter for preprocedural cardiovascular examination: Secondary | ICD-10-CM

## 2012-03-22 DIAGNOSIS — I480 Paroxysmal atrial fibrillation: Secondary | ICD-10-CM | POA: Diagnosis present

## 2012-03-22 DIAGNOSIS — R040 Epistaxis: Secondary | ICD-10-CM | POA: Diagnosis not present

## 2012-03-22 DIAGNOSIS — K810 Acute cholecystitis: Principal | ICD-10-CM | POA: Diagnosis present

## 2012-03-22 DIAGNOSIS — I4891 Unspecified atrial fibrillation: Secondary | ICD-10-CM | POA: Diagnosis present

## 2012-03-22 DIAGNOSIS — Z7901 Long term (current) use of anticoagulants: Secondary | ICD-10-CM

## 2012-03-22 DIAGNOSIS — I509 Heart failure, unspecified: Secondary | ICD-10-CM | POA: Diagnosis present

## 2012-03-22 DIAGNOSIS — Z951 Presence of aortocoronary bypass graft: Secondary | ICD-10-CM

## 2012-03-22 DIAGNOSIS — I2510 Atherosclerotic heart disease of native coronary artery without angina pectoris: Secondary | ICD-10-CM

## 2012-03-22 DIAGNOSIS — N400 Enlarged prostate without lower urinary tract symptoms: Secondary | ICD-10-CM

## 2012-03-22 DIAGNOSIS — E669 Obesity, unspecified: Secondary | ICD-10-CM | POA: Diagnosis present

## 2012-03-22 DIAGNOSIS — Z8679 Personal history of other diseases of the circulatory system: Secondary | ICD-10-CM

## 2012-03-22 DIAGNOSIS — H409 Unspecified glaucoma: Secondary | ICD-10-CM | POA: Diagnosis present

## 2012-03-22 DIAGNOSIS — I77810 Thoracic aortic ectasia: Secondary | ICD-10-CM

## 2012-03-22 DIAGNOSIS — I872 Venous insufficiency (chronic) (peripheral): Secondary | ICD-10-CM

## 2012-03-22 DIAGNOSIS — E785 Hyperlipidemia, unspecified: Secondary | ICD-10-CM | POA: Diagnosis present

## 2012-03-22 DIAGNOSIS — Z6830 Body mass index (BMI) 30.0-30.9, adult: Secondary | ICD-10-CM

## 2012-03-22 DIAGNOSIS — I5022 Chronic systolic (congestive) heart failure: Secondary | ICD-10-CM | POA: Diagnosis present

## 2012-03-22 HISTORY — DX: Calculus of kidney: N20.0

## 2012-03-22 LAB — URINALYSIS, ROUTINE W REFLEX MICROSCOPIC
Bilirubin Urine: NEGATIVE
Glucose, UA: NEGATIVE mg/dL
Ketones, ur: NEGATIVE mg/dL
Protein, ur: NEGATIVE mg/dL
pH: 7 (ref 5.0–8.0)

## 2012-03-22 LAB — CBC
HCT: 26.4 % — ABNORMAL LOW (ref 39.0–52.0)
Hemoglobin: 9.1 g/dL — ABNORMAL LOW (ref 13.0–17.0)
MCH: 29.9 pg (ref 26.0–34.0)
MCV: 92 fL (ref 78.0–100.0)
MCV: 86.8 fL (ref 78.0–100.0)
Platelets: 267 10*3/uL (ref 150–400)
RBC: 3 MIL/uL — ABNORMAL LOW (ref 4.22–5.81)
RBC: 3.04 MIL/uL — ABNORMAL LOW (ref 4.22–5.81)
RDW: 16 % — ABNORMAL HIGH (ref 11.5–15.5)
WBC: 6.4 10*3/uL (ref 4.0–10.5)
WBC: 8.1 10*3/uL (ref 4.0–10.5)

## 2012-03-22 LAB — COMPREHENSIVE METABOLIC PANEL
ALT: 196 U/L — ABNORMAL HIGH (ref 0–53)
AST: 345 U/L — ABNORMAL HIGH (ref 0–37)
Albumin: 2.9 g/dL — ABNORMAL LOW (ref 3.5–5.2)
Alkaline Phosphatase: 359 U/L — ABNORMAL HIGH (ref 39–117)
CO2: 23 mEq/L (ref 19–32)
Chloride: 99 mEq/L (ref 96–112)
Creatinine, Ser: 1.22 mg/dL (ref 0.50–1.35)
GFR calc non Af Amer: 53 mL/min — ABNORMAL LOW (ref >90)
Potassium: 4.5 mEq/L (ref 3.5–5.1)
Sodium: 134 mEq/L — ABNORMAL LOW (ref 135–145)
Total Bilirubin: 1.8 mg/dL — ABNORMAL HIGH (ref 0.3–1.2)

## 2012-03-22 LAB — CREATININE, SERUM
GFR calc Af Amer: 62 mL/min — ABNORMAL LOW (ref >90)
GFR calc non Af Amer: 54 mL/min — ABNORMAL LOW (ref >90)

## 2012-03-22 LAB — POCT I-STAT TROPONIN I

## 2012-03-22 LAB — CK TOTAL AND CKMB (NOT AT ARMC): Relative Index: INVALID (ref 0.0–2.5)

## 2012-03-22 MED ORDER — NITROGLYCERIN 0.4 MG SL SUBL: 0.4000 mg | SUBLINGUAL_TABLET | SUBLINGUAL | Status: DC | PRN

## 2012-03-22 MED ORDER — IOHEXOL 300 MG/ML  SOLN
50.0000 mL | Freq: Once | INTRAMUSCULAR | Status: AC | PRN
  Administered 2012-03-22: 50 mL via ORAL

## 2012-03-22 MED ORDER — IOHEXOL 300 MG/ML  SOLN
125.0000 mL | Freq: Once | INTRAMUSCULAR | Status: AC | PRN
  Administered 2012-03-22: 125 mL via INTRAVENOUS

## 2012-03-22 MED ORDER — ONDANSETRON HCL 4 MG/2ML IJ SOLN: 4.0000 mg | Freq: Four times a day (QID) | INTRAMUSCULAR | Status: DC | PRN

## 2012-03-22 MED ORDER — ONDANSETRON HCL 4 MG/2ML IJ SOLN
4.0000 mg | Freq: Once | INTRAMUSCULAR | Status: AC
  Administered 2012-03-22: 4 mg via INTRAVENOUS
  Filled 2012-03-22: qty 2

## 2012-03-22 MED ORDER — ASPIRIN EC 81 MG PO TBEC
81.0000 mg | DELAYED_RELEASE_TABLET | Freq: Every day | ORAL | Status: DC
  Administered 2012-03-23 – 2012-03-25 (×2): 81 mg via ORAL
  Filled 2012-03-22 (×3): qty 1

## 2012-03-22 MED ORDER — AMIODARONE HCL 200 MG PO TABS
200.0000 mg | ORAL_TABLET | Freq: Two times a day (BID) | ORAL | Status: DC
  Filled 2012-03-22 (×2): qty 1

## 2012-03-22 MED ORDER — ENOXAPARIN SODIUM 40 MG/0.4ML ~~LOC~~ SOLN
40.0000 mg | SUBCUTANEOUS | Status: DC
  Administered 2012-03-22 – 2012-03-23 (×2): 40 mg via SUBCUTANEOUS
  Filled 2012-03-22 (×3): qty 0.4

## 2012-03-22 MED ORDER — SODIUM CHLORIDE 0.9 % IV SOLN
3.0000 g | Freq: Once | INTRAVENOUS | Status: AC
  Administered 2012-03-22: 3 g via INTRAVENOUS
  Filled 2012-03-22: qty 3

## 2012-03-22 MED ORDER — HYDROCODONE-ACETAMINOPHEN 5-325 MG PO TABS
1.0000 | ORAL_TABLET | ORAL | Status: DC | PRN
  Administered 2012-03-22: 2 via ORAL
  Filled 2012-03-22: qty 2

## 2012-03-22 MED ORDER — GALANTAMINE HYDROBROMIDE ER 8 MG PO CP24
8.0000 mg | ORAL_CAPSULE | Freq: Every day | ORAL | Status: DC
  Administered 2012-03-23: 8 mg via ORAL
  Filled 2012-03-22 (×2): qty 1

## 2012-03-22 MED ORDER — ATENOLOL 25 MG PO TABS
25.0000 mg | ORAL_TABLET | Freq: Every day | ORAL | Status: DC
  Administered 2012-03-22 – 2012-03-25 (×4): 25 mg via ORAL
  Filled 2012-03-22 (×4): qty 1

## 2012-03-22 MED ORDER — MORPHINE SULFATE 4 MG/ML IJ SOLN
4.0000 mg | Freq: Once | INTRAMUSCULAR | Status: AC
  Administered 2012-03-22: 4 mg via INTRAVENOUS
  Filled 2012-03-22: qty 1

## 2012-03-22 MED ORDER — SODIUM CHLORIDE 0.9 % IV SOLN
INTRAVENOUS | Status: DC
  Administered 2012-03-22 – 2012-03-24 (×4): via INTRAVENOUS

## 2012-03-22 MED ORDER — ASPIRIN 81 MG PO TABS: 81.0000 mg | ORAL_TABLET | Freq: Every day | ORAL | Status: DC

## 2012-03-22 MED ORDER — MORPHINE SULFATE 2 MG/ML IJ SOLN: 1.0000 mg | INTRAMUSCULAR | Status: DC | PRN

## 2012-03-22 MED ORDER — ONDANSETRON HCL 4 MG PO TABS: 4.0000 mg | ORAL_TABLET | Freq: Four times a day (QID) | ORAL | Status: DC | PRN

## 2012-03-22 NOTE — ED Notes (Signed)
Pt finished the first cup of contrast, CT notified.

## 2012-03-22 NOTE — ED Notes (Signed)
DR.Miller shown results of Istat Trop. Results  ED-Lab.

## 2012-03-22 NOTE — Consult Note (Signed)
Admit date: 03/22/2012 Referring Physician  Dr. Sabra Heck Primary Physician [name], MD Primary Cardiologist  Dr. Wynonia Lawman Reason for Consultation  Pre-op cholecystectomy  HPI: 79 year old with severe coronary artery disease status post bypass surgery approximately 2 weeks ago with Maze procedure, left atrial appendage clipping, on chronic anticoagulation with Xarelto here with acute cholecystitis. Elevated LFTs. Surgical consultation reviewed. He developed dry heaves, no definitive emesis, abdominal discomfort radiating across his upper epigastric region late last night. Chills intermittently. Right leg wound with serous drainage.  He has not had any exertional anginal symptoms. He has not had any symptomatic palpitations. No bleeding, no strokelike symptoms. No orthopnea. He does have minor shortness of breath with ambulation.    PMH:   Past Medical History  Diagnosis Date  . Hypertension   . Angina   . Coronary artery disease   . CAD (coronary artery disease)     Cath April 2012 40% left main, 50% LAD, 40% OM, occluded RCA with L-R collaterals   . Hypertension   . BPH (benign prostatic hypertrophy)   . Chronic venous insufficiency   . Gout   . Hyperlipidemia   . Obesity (BMI 30-39.9)   . Glaucoma   . Gout   . Acute coronary syndrome 02/29/2012  . Atrial fibrillation 02/29/2012    Recurrent paroxysmal, new-onset  . Acute systolic heart failure 0000000  . S/P CABG x 3 03/06/2012    LIMA to LAD, SVG to OM, SVG to RCA, EVH via right thigh  . S/P Maze operation for atrial fibrillation 03/06/2012    Complete bilateral lesions set using bipolar radiofrequency and cryothermy ablation with clipping of LA appendage  . Ascending aorta dilatation 03/13/2012    Fusiform dilatation of the ascending thoracic aorta discovered during surgery, max diameter 4.2-4.3 cm  . Kidney stones     PSH:   Past Surgical History  Procedure Laterality Date  . Cardiac catheterization    . Prostatectomy    .  Cataract extraction, bilateral    . Coronary artery bypass graft N/A 03/06/2012    Procedure: CORONARY ARTERY BYPASS GRAFTING (CABG);  Surgeon: Rexene Alberts, MD;  Location: [place];  Service: Open Heart Surgery;  Laterality: N/A;  . Maze N/A 03/06/2012    Procedure: MAZE;  Surgeon: Rexene Alberts, MD;  Location: [place];  Service: Open Heart Surgery;  Laterality: N/A;  . Intraoperative transesophageal echocardiogram N/A 03/06/2012    Procedure: INTRAOPERATIVE TRANSESOPHAGEAL ECHOCARDIOGRAM;  Surgeon: Rexene Alberts, MD;  Location: [place];  Service: Open Heart Surgery;  Laterality: N/A;  . Endovein harvest of greater saphenous vein Right 03/06/2012    Procedure: ENDOVEIN HARVEST OF GREATER SAPHENOUS VEIN;  Surgeon: Rexene Alberts, MD;  Location: [place];  Service: Open Heart Surgery;  Laterality: Right;   Allergies:  Bee venom Prior to Admit Meds:   Prescriptions prior to admission  Medication Sig Dispense Refill  . allopurinol (ZYLOPRIM) 300 MG tablet Take 150 mg by mouth Daily.      Marland Kitchen amiodarone (PACERONE) 200 MG tablet Take 1 tablet (200 mg total) by mouth 2 (two) times daily after a meal.  60 tablet  1  . aspirin 81 MG tablet Take 81 mg by mouth daily.      Marland Kitchen atenolol (TENORMIN) 25 MG tablet Take 1 tablet (25 mg total) by mouth daily.  30 tablet  1  . cholecalciferol (VITAMIN D) 1000 UNITS tablet Take 1,000 Units by mouth daily.      Alveta Heimlich  20 MG tablet Take 20 mg by mouth Daily.      . fish oil-omega-3 fatty acids 1000 MG capsule Take 1 g by mouth daily.       Marland Kitchen galantamine (RAZADYNE ER) 8 MG 24 hr capsule Take 8 mg by mouth Daily.      Marland Kitchen NITROSTAT 0.4 MG SL tablet Place 1 tablet under the tongue every 5 (five) minutes x 3 doses as needed for chest pain.       Marland Kitchen oxyCODONE (OXY IR/ROXICODONE) 5 MG immediate release tablet Take 1-2 tablets (5-10 mg total) by mouth every 4 (four) hours as needed.  50 tablet  0  . PRESCRIPTION MEDICATION Inject 1 applicator into the muscle every 8 (eight) weeks.  Allergy shots for Bee Venom allergy      . psyllium (METAMUCIL) 58.6 % packet Take 1 packet by mouth daily.      . rivaroxaban (XARELTO) 15 MG TABS tablet Take 1 tablet (15 mg total) by mouth daily with supper.  30 tablet  1  . vitamin B-12 (CYANOCOBALAMIN) 1000 MCG tablet Take 500 mcg by mouth daily.      Marland Kitchen ZETIA 10 MG tablet Take 10 mg by mouth Daily.      . cyanocobalamin (,VITAMIN B-12,) 1000 MCG/ML injection Inject 1,000 mcg into the muscle See admin instructions. Every three weeks      . EPINEPHrine (EPIPEN 2-PAK) 0.3 mg/0.3 mL DEVI Inject 0.3 mg into the muscle See admin instructions. For severe allergic reaction       Fam HX:    Family History  Problem Relation Age of Onset  . Dementia Mother   . Stroke Brother    Social HX:    History   Social History  . Marital Status: Widowed    Spouse Name: N/A    Number of Children: N/A  . Years of Education: N/A   Occupational History  . 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 Smoking status: Former Research scientist (life sciences)  . Smokeless tobacco: Never Used  . Alcohol Use: 0.6 oz/week    1 Shots of liquor per week     Comment: weekly  . Drug Use: Not on file  . Sexually Active: Yes   Other Topics Concern  . Not on file   Social History Narrative   Lives with wife in [place].     Very active physically without significant limitations.    Exercises regularly     ROS:  All 11 ROS were addressed and are negative except what is stated in the HPI  Physical Exam: Blood pressure 159/79, pulse 69, temperature 97.5 F (36.4 C), temperature source Oral, resp. rate 19, SpO2 100.00%.    General: Well developed, well nourished, in no acute distress Head: Eyes PERRLA, No xanthomas.   Normal cephalic and atramatic  Lungs:   Clear bilaterally to auscultation and percussion. Normal respiratory effort. No wheezes, no rales. Heart:   HRRR S1 S2 Pulses are 2+ & equal.            No carotid bruit. No JVD.  No abdominal bruits. No  femoral bruits. Abdomen: Bowel sounds are positive, abdomen soft and mildly tender without masses. No hepatosplenomegaly. Msk:  Back normal. Normal strength and tone for age. Extremities:   No clubbing, cyanosis or RLE 1+ edema.  DP +1 Neuro: Alert and oriented X 3, non-focal, MAE x 4 GU: Deferred Rectal: Deferred Psych:  Good affect, responds appropriately  Labs:   Lab Results  Component Value Date   WBC 6.4 03/22/2012   HGB 9.2* 03/22/2012   HCT 27.6* 03/22/2012   MCV 92.0 03/22/2012   PLT 267 03/22/2012    Recent Labs Lab 03/22/12 0645  NA 134*  K 4.5  CL 99  CO2 23  BUN 18  CREATININE 1.22  CALCIUM 9.0  PROT 6.6  BILITOT 1.8*  ALKPHOS 359*  ALT 196*  AST 345*  GLUCOSE 126*   No results found for this basename: PTT   Lab Results  Component Value Date   INR 1.51* 03/10/2012   INR 1.22 03/09/2012   INR 1.32 03/08/2012   Lab Results  Component Value Date   CKTOTAL 35 03/22/2012   CKMB 3.3 03/22/2012   TROPONINI <0.30 03/02/2012     Lab Results  Component Value Date   CHOL 84 03/03/2012   Lab Results  Component Value Date   HDL 40 03/03/2012   Lab Results  Component Value Date   LDLCALC 29 03/03/2012   Lab Results  Component Value Date   TRIG 75 03/03/2012   Lab Results  Component Value Date   CHOLHDL 2.1 03/03/2012   No results found for this basename: LDLDIRECT      Radiology:  US Abdomen Complete  03/22/2012  *RADIOLOGY REPORT*  Clinical Data:  Pain, evaluate for possible cholecystitis  COMPLETE ABDOMINAL ULTRASOUND  Comparison:  None.  Findings:  Gallbladder:  There are numerous small calculi and there is sludge all layering dependently within the gallbladder lumen.  Gallbladder wall measures 4 mm in thickness.  There is no sonographic Murphy's sign.  Common bile duct:  Normal at 6 mm  Liver:  No focal lesion identified.  Within normal limits in parenchymal echogenicity.  IVC:  Appears normal.  Pancreas:  No focal abnormality seen.  Spleen:  Normal at 6 cm   Right Kidney:  12 cm in length.  Normal except for 3 cm simple cyst upper pole.  Left Kidney:  Normal, measuring 12 cm  Abdominal aorta:  No aneurysm identified.  IMPRESSION: Cholelithiasis with gallbladder sludge and gallbladder wall thickening.  No sonographic Murphy's sign or biliary dilatation. Although nonspecific, the findings can be seen with cholecystitis. If clinically appropriate, consider hepatic biliary scan.   Original Report Authenticated By: Skipper Cliche, M.D.    Ct Abdomen Pelvis W Contrast  03/22/2012  *RADIOLOGY REPORT*  Clinical Data: Right upper quadrant pain.  Concern for gallstones or obstruction.  CT ABDOMEN AND PELVIS WITH CONTRAST  Technique:  Multidetector CT imaging of the abdomen and pelvis was performed following the standard protocol during bolus administration of intravenous contrast.  Contrast: 152mL OMNIPAQUE IOHEXOL 300 MG/ML  SOLN  Comparison: CT 07/02/2009, ultrasound 03/22/2012  Findings: There are bilateral pleural effusions and associated basilar atelectasis.  No pericardial fluid.  There is no focal hepatic lesion.  There two gallstones in the gallbladder.  There is mild gallbladder wall thickening. There is pericholecystic fluid seen on the coronal projection.  The gallbladder is mildy distended measuring 3.7 cm.  Common bile duct is normal caliber.  There is no intrahepatic biliary dilatation. The pancreas, spleen, and adrenal glands are normal.  There is simple cyst within the right kidney.  The stomach, small bowel, appendix, cecum are normal.  Multiple diverticula throughout the colon without evidence acute inflammation.  Abdominal aorta normal caliber.  No retroperitoneal periportal lymphadenopathy.  No free fluid the pelvis.  The prostate gland bladder normal. Small fat filled inguinal  hernias are present.  The left and Review of  bone windows demonstrates no aggressive osseous lesions.  IMPRESSION:  1.  Gallbladder wall thickening and pericholecystic fluid are  concerning for acute cholecystitis. Gallstones and sludge present on ultrasound.  2.  Extensive colonic diverticulosis without diverticulitis. 3.  Bilateral pleural effusions and passive atelectasis.   Original Report Authenticated By: Suzy Bouchard, M.D.    Personally viewed.  EKG:  Sinus rhythm, right bundle branch block, ventricular trigeminy/PVCs.  Personally viewed.   ECHO: - Left ventricle: The cavity size was normal. Systolic function was mildly reduced. The estimated ejection fraction was in the range of 45% to 50%. Wall motion was normal; there were no regional wall motion abnormalities.     ASSESSMENT/PLAN:   79 year old male with severe coronary artery disease status post bypass surgery on 3/6/14x3 by Dr. Roxy Manns, LIMA to LAD, SVG to OM, SVG to right coronary artery, MAZE procedure with clipping of left atrial appendage, prior atrial fibrillation on chronic anticoagulation who presents with acute cholecystitis.  1. Preoperative risk stratification-with recent revascularization, no objective evidence of ischemia, he may proceed with surgery with moderate overall cardiovascular risk given the temporal nature of his prior bypass. There is no evidence of significant pericardial effusion. I performed portable echo. Given his elevated LFTs, I will hold his amiodarone, Crestor. Once these begin transition downward, we will likely reinitiate.  2. Chronic anticoagulation/prior atrial fibrillation - I agree with holding Xarelto. I would wait 2 days optimally, (operate Monday). He is currently in sinus rhythm. Apparently, he did have some recurrence of atrial fibrillation following MAZE postoperatively. Optimally, following cholecystectomy, I would resume anticoagulation however, bleeding risk needs to be taken into account. It would not be unusual to see atrial fibrillation re surface in the setting of cholecystitis  3. Severe CAD-post bypass surgery recently. TCTS should be involved. I will  notify.  Candee Furbish, MD  03/22/2012  1:18 PM

## 2012-03-22 NOTE — H&P (Signed)
[facility] History and Physical  SKYLIER KESTLER [id number] DOB: 1926/05/11 DOA: 03/22/2012  Referring physician: Dr Sabra Heck PCP: [name], MD  Specialists: Dr Wynonia Lawman  Chief Complaint: abd pain  HPI: ROBBEN OELSCHLAGER is a 79 y.o. male with h/o CAD, s/p CABG less than two weeks ago , came in today for worsening abdominal pain associated with nausea, and dry heaving started last night. No fever or chills. No chest pain or sob. HIS ABD pain is at the RUQ . He also reports right lower extremity swelling > lle, since the GSV was harvested for CABG. ON arrival to ED, he underwent a CT ABD and pelvis showed features consistent with acute cholecystitis. He is being admitted to medical service with surgery consulting . Since he has a recent CABG and is onxarelto cardiology and ctvs will be consulted.   Review of Systems: The patient denies anorexia, fever, weight loss,, vision loss, decreased hearing, hoarseness, chest pain, syncope, dyspnea on exertion, balance deficits, hemoptysis,  melena, hematochezia, severe indigestion/heartburn, hematuria, incontinence, genital sores, muscle weakness, suspicious skin lesions, transient blindness, difficulty walking, depression, unusual weight change, abnormal bleeding, enlarged lymph nodes, angioedema, and breast masses.    Past Medical History  Diagnosis Date  . Hypertension   . Angina   . Coronary artery disease   . CAD (coronary artery disease)     Cath April 2012 40% left main, 50% LAD, 40% OM, occluded RCA with L-R collaterals   . Hypertension   . BPH (benign prostatic hypertrophy)   . Chronic venous insufficiency   . Gout   . Hyperlipidemia   . Obesity (BMI 30-39.9)   . Glaucoma   . Gout   . Acute coronary syndrome 02/29/2012  . Atrial fibrillation 02/29/2012    Recurrent paroxysmal, new-onset  . Acute systolic heart failure 0000000  . S/P CABG x 3 03/06/2012    LIMA to LAD, SVG to OM, SVG to RCA, EVH via right thigh  . S/P Maze operation  for atrial fibrillation 03/06/2012    Complete bilateral lesions set using bipolar radiofrequency and cryothermy ablation with clipping of LA appendage  . Ascending aorta dilatation 03/13/2012    Fusiform dilatation of the ascending thoracic aorta discovered during surgery, max diameter 4.2-4.3 cm  . Kidney stones    Past Surgical History  Procedure Laterality Date  . Cardiac catheterization    . Prostatectomy    . Cataract extraction, bilateral    . Coronary artery bypass graft N/A 03/06/2012    Procedure: CORONARY ARTERY BYPASS GRAFTING (CABG);  Surgeon: Rexene Alberts, MD;  Location: [place];  Service: Open Heart Surgery;  Laterality: N/A;  . Maze N/A 03/06/2012    Procedure: MAZE;  Surgeon: Rexene Alberts, MD;  Location: [place];  Service: Open Heart Surgery;  Laterality: N/A;  . Intraoperative transesophageal echocardiogram N/A 03/06/2012    Procedure: INTRAOPERATIVE TRANSESOPHAGEAL ECHOCARDIOGRAM;  Surgeon: Rexene Alberts, MD;  Location: [place];  Service: Open Heart Surgery;  Laterality: N/A;  . Endovein harvest of greater saphenous vein Right 03/06/2012    Procedure: ENDOVEIN HARVEST OF GREATER SAPHENOUS VEIN;  Surgeon: Rexene Alberts, MD;  Location: [place];  Service: Open Heart Surgery;  Laterality: Right;   Social History:  reports that he has quit smoking. He has never used smokeless tobacco. He reports that he drinks about 0.6 ounces of alcohol per week. His drug history is not on file.  where does patient live--home, Allergies  Allergen Reactions  .  Bee Venom Anaphylaxis    Family History  Problem Relation Age of Onset  . Dementia Mother   . Stroke Brother      Prior to Admission medications   Medication Sig Start Date End Date Taking? Authorizing Provider  allopurinol (ZYLOPRIM) 300 MG tablet Take 150 mg by mouth Daily. 02/26/11  Yes Historical Provider, MD  amiodarone (PACERONE) 200 MG tablet Take 1 tablet (200 mg total) by mouth 2 (two) times daily after a meal. 03/11/12  Yes Erin  Barrett, PA-C  aspirin 81 MG tablet Take 81 mg by mouth daily.   Yes Historical Provider, MD  atenolol (TENORMIN) 25 MG tablet Take 1 tablet (25 mg total) by mouth daily. 03/12/12  Yes Erin Barrett, PA-C  cholecalciferol (VITAMIN D) 1000 UNITS tablet Take 1,000 Units by mouth daily.   Yes Historical Provider, MD  CRESTOR 20 MG tablet Take 20 mg by mouth Daily. 02/26/11  Yes Historical Provider, MD  fish oil-omega-3 fatty acids 1000 MG capsule Take 1 g by mouth daily.    Yes Historical Provider, MD  galantamine (RAZADYNE ER) 8 MG 24 hr capsule Take 8 mg by mouth Daily. 02/26/11  Yes Historical Provider, MD  NITROSTAT 0.4 MG SL tablet Place 1 tablet under the tongue every 5 (five) minutes x 3 doses as needed for chest pain.  04/01/11  Yes Historical Provider, MD  oxyCODONE (OXY IR/ROXICODONE) 5 MG immediate release tablet Take 1-2 tablets (5-10 mg total) by mouth every 4 (four) hours as needed. 03/11/12  Yes Erin Barrett, PA-C  PRESCRIPTION MEDICATION Inject 1 applicator into the muscle every 8 (eight) weeks. Allergy shots for Bee Venom allergy   Yes Historical Provider, MD  psyllium (METAMUCIL) 58.6 % packet Take 1 packet by mouth daily.   Yes Historical Provider, MD  rivaroxaban (XARELTO) 15 MG TABS tablet Take 1 tablet (15 mg total) by mouth daily with supper. 03/11/12  Yes Erin Barrett, PA-C  vitamin B-12 (CYANOCOBALAMIN) 1000 MCG tablet Take 500 mcg by mouth daily.   Yes Historical Provider, MD  ZETIA 10 MG tablet Take 10 mg by mouth Daily. 02/26/11  Yes Historical Provider, MD  cyanocobalamin (,VITAMIN B-12,) 1000 MCG/ML injection Inject 1,000 mcg into the muscle See admin instructions. Every three weeks    Historical Provider, MD  EPINEPHrine (EPIPEN 2-PAK) 0.3 mg/0.3 mL DEVI Inject 0.3 mg into the muscle See admin instructions. For severe allergic reaction    Historical Provider, MD   Physical Exam: Filed Vitals:   03/22/12 1230 03/22/12 1245 03/22/12 1300 03/22/12 1343  BP: 151/82 148/74 159/79  149/73  Pulse: 70 68 69 67  Temp:    97.9 F (36.6 C)  TempSrc:    Oral  Resp: 19 18 19 18   SpO2: 100% 99% 100% 100%    Constitutional: Vital signs reviewed.  Patient is a well-developed and well-nourished  in no acute distress and cooperative with exam. Alert and oriented x3.  Head: Normocephalic and atraumatic Mouth: no erythema or exudates, MMM Eyes: PERRL, EOMI, conjunctivae normal, No scleral icterus.  Neck: Supple, Trachea midline normal ROM, No JVD, mass, thyromegaly, or carotid bruit present.  Cardiovascular: RRR, S1 normal, S2 normal, no MRG, pulses symmetric and intact bilaterally Pulmonary/Chest: CTAB, no wheezes, rales, or rhonchi Abdominal: Soft. -tender in the RUQ, positive murphy sign., non-distended, bowel sounds are normal,  Musculoskeletal: RLE swelling > lle. Hematology: no cervical, inginal, or axillary adenopathy.  Neurological: A&O x3, no facial asymmetry, no focal motor deficit, sensory intact to light  touch bilaterally.  Skin: Warm, dry and intact. No rash, cyanosis, or clubbing.  Psychiatric: Normal mood and affect. speech and behavior is normal.    Labs on Admission:  Basic Metabolic Panel:  Recent Labs Lab 03/22/12 0645  NA 134*  K 4.5  CL 99  CO2 23  GLUCOSE 126*  BUN 18  CREATININE 1.22  CALCIUM 9.0   Liver Function Tests:  Recent Labs Lab 03/22/12 0645  AST 345*  ALT 196*  ALKPHOS 359*  BILITOT 1.8*  PROT 6.6  ALBUMIN 2.9*    Recent Labs Lab 03/22/12 0645  LIPASE 140*   No results found for this basename: AMMONIA,  in the last 168 hours CBC:  Recent Labs Lab 03/22/12 0645  WBC 6.4  HGB 9.2*  HCT 27.6*  MCV 92.0  PLT 267   Cardiac Enzymes:  Recent Labs Lab 03/22/12 1103  CKTOTAL 35  CKMB 3.3    BNP (last 3 results)  Recent Labs  03/03/12 0751  PROBNP 1089.0*   CBG: No results found for this basename: GLUCAP,  in the last 168 hours  Radiological Exams on Admission: US Abdomen Complete  03/22/2012   *RADIOLOGY REPORT*  Clinical Data:  Pain, evaluate for possible cholecystitis  COMPLETE ABDOMINAL ULTRASOUND  Comparison:  None.  Findings:  Gallbladder:  There are numerous small calculi and there is sludge all layering dependently within the gallbladder lumen.  Gallbladder wall measures 4 mm in thickness.  There is no sonographic Murphy's sign.  Common bile duct:  Normal at 6 mm  Liver:  No focal lesion identified.  Within normal limits in parenchymal echogenicity.  IVC:  Appears normal.  Pancreas:  No focal abnormality seen.  Spleen:  Normal at 6 cm  Right Kidney:  12 cm in length.  Normal except for 3 cm simple cyst upper pole.  Left Kidney:  Normal, measuring 12 cm  Abdominal aorta:  No aneurysm identified.  IMPRESSION: Cholelithiasis with gallbladder sludge and gallbladder wall thickening.  No sonographic Murphy's sign or biliary dilatation. Although nonspecific, the findings can be seen with cholecystitis. If clinically appropriate, consider hepatic biliary scan.   Original Report Authenticated By: Skipper Cliche, M.D.    Ct Abdomen Pelvis W Contrast  03/22/2012  *RADIOLOGY REPORT*  Clinical Data: Right upper quadrant pain.  Concern for gallstones or obstruction.  CT ABDOMEN AND PELVIS WITH CONTRAST  Technique:  Multidetector CT imaging of the abdomen and pelvis was performed following the standard protocol during bolus administration of intravenous contrast.  Contrast: 160mL OMNIPAQUE IOHEXOL 300 MG/ML  SOLN  Comparison: CT 07/02/2009, ultrasound 03/22/2012  Findings: There are bilateral pleural effusions and associated basilar atelectasis.  No pericardial fluid.  There is no focal hepatic lesion.  There two gallstones in the gallbladder.  There is mild gallbladder wall thickening. There is pericholecystic fluid seen on the coronal projection.  The gallbladder is mildy distended measuring 3.7 cm.  Common bile duct is normal caliber.  There is no intrahepatic biliary dilatation. The pancreas, spleen, and  adrenal glands are normal.  There is simple cyst within the right kidney.  The stomach, small bowel, appendix, cecum are normal.  Multiple diverticula throughout the colon without evidence acute inflammation.  Abdominal aorta normal caliber.  No retroperitoneal periportal lymphadenopathy.  No free fluid the pelvis.  The prostate gland bladder normal. Small fat filled inguinal hernias are present.  The left and Review of  bone windows demonstrates no aggressive osseous lesions.  IMPRESSION:  1.  Gallbladder wall  thickening and pericholecystic fluid are concerning for acute cholecystitis. Gallstones and sludge present on ultrasound.  2.  Extensive colonic diverticulosis without diverticulitis. 3.  Bilateral pleural effusions and passive atelectasis.   Original Report Authenticated By: Suzy Bouchard, M.D.     EKG: SR, with RBBB  Assessment/Plan Active Problems:      1. Acute cholecystitis: IV UNASYN, gentle hydration. Pain control. Surgery recommendations appreciated. We will hold xarelto in anticipation of surgery. Elevated liver function tests secondary to choledocholithiasis.  2. CAD s/p CABG: 2 weeks ago. Cardiology and CTVS consultation requested.   3. Hypertension: controlled.   4. Paroxysmal atrial fibrillation: in sinus and on b blockers.  Would hold xarelto for surgery.   5. DVT prophylaxis.     Code Status: full code Family Communication: wife at bedside Disposition Plan: pending.     [facility] Pager [phone]  If 7PM-7AM, please contact night-coverage [email or web address] Password [facility] 03/22/2012, 2:36 PM

## 2012-03-22 NOTE — ED Provider Notes (Signed)
79 year old male whose approximately 2 weeks status post CABG procedure presents because of abdominal pain nausea and retching which occurred overnight. He denies fevers or chills shortness of breath or chest pain. On exam he has right upper quadrant tenderness, tympanitic sounds to percussion across his upper abdomen, no tenderness in his lower abdomen and no peritoneal signs. He does have a Murphy sign. He has lower extremities edema right greater than left, of note his venous graft sites came from his right lower extremity. Lungs and heart appear clear, he is not in any respiratory distress and appears comfortable when not being examined. He had very little pain medication this week including minimal Tylenol, he drinks daily alcohol but did not have any last night.  Laboratory workup shows transaminitis, elevated lipase but no leukocytosis. Ultrasound pending, symptomatic control is achieved as the patient had a dose of morphine on arrival, would consider CT scan ultrasound clotted by gaseous distention of the abdomen.  No leukocytosis, significant transaminitis, ultrasound shows gallstones and wall thickening, CT confirms likely cholecystitis. The patient is currently anticoagulated and cannot have emergent surgery, he is not appear toxic, we'll keep n.p.o., antibiotics. Discussed with Dr. Donne Hazel who will see the patient in consultation for surgery but will need a medical admission.  Medical screening examination/treatment/procedure(s) were conducted as a shared visit with non-physician practitioner(s) and myself.  I personally evaluated the patient during the encounter    Johnna Acosta, MD 03/22/12 1208

## 2012-03-22 NOTE — Consult Note (Signed)
Appears he has cholecystitis, ? Gsp or has passed a stone.  Will plan on abx and then recheck labs in am.  He will need cholecystectomy unless contraindicated.  Will need cards to see and hold xarelto for surgery if ok with them.

## 2012-03-22 NOTE — Consult Note (Signed)
[name] [name] 12/29/1926  [id number].   Primary Care MD: Dr. [name] Cardiologist: Dr. Tollie Eth TCTS: Dr. Gustavo Lah Requesting MD: Dr. Noemi Chapel Chief Complaint/Reason for Consult: acute cholecystitis HPI: This is an 79 yo male who has multiple medical problems who just underwent a CABGx3 2 weeks ago.  He had been doing well at home, until last night after supper.  Around 2300, he developed abdominal pain.  Then around midnight, he developed dry heaves, but no definite emesis.  He took a dulcolax to see if this would help.  It did not.  His wife called Dr. Roxy Manns who did not feel this was related to his surgery.  Due to persistent pain, he came to [facility] where he was found to have acute cholecystitis with elevation of his LFTs.  He denies any fevers.  He has been having chills intermittently for the last week.  His wife feels this may be related to his anemia.  He also admits to significant serous drainage and edema from the right left where his GSV was harvested.  The patient is on xarelto.  We have been asked to see the patient for further recommendations.  Review of Systems: Please see HPI, otherwise all other systems are negative.  Family History  Problem Relation Age of Onset  . Dementia Mother   . Stroke Brother     Past Medical History  Diagnosis Date  . Hypertension   . Angina   . Coronary artery disease   . CAD (coronary artery disease)     Cath April 2012 40% left main, 50% LAD, 40% OM, occluded RCA with L-R collaterals   . Hypertension   . BPH (benign prostatic hypertrophy)   . Chronic venous insufficiency   . Gout   . Hyperlipidemia   . Obesity (BMI 30-39.9)   . Glaucoma   . Gout   . Acute coronary syndrome 02/29/2012  . Atrial fibrillation 02/29/2012    Recurrent paroxysmal, new-onset  . Acute systolic heart failure 0000000  . S/P CABG x 3 03/06/2012    LIMA to LAD, SVG to OM, SVG to RCA, EVH via right thigh  . S/P Maze operation for atrial fibrillation  03/06/2012    Complete bilateral lesions set using bipolar radiofrequency and cryothermy ablation with clipping of LA appendage  . Ascending aorta dilatation 03/13/2012    Fusiform dilatation of the ascending thoracic aorta discovered during surgery, max diameter 4.2-4.3 cm  . Kidney stones     Past Surgical History  Procedure Laterality Date  . Cardiac catheterization    . Prostatectomy    . Cataract extraction, bilateral    . Coronary artery bypass graft N/A 03/06/2012    Procedure: CORONARY ARTERY BYPASS GRAFTING (CABG);  Surgeon: Rexene Alberts, MD;  Location: [place];  Service: Open Heart Surgery;  Laterality: N/A;  . Maze N/A 03/06/2012    Procedure: MAZE;  Surgeon: Rexene Alberts, MD;  Location: [place];  Service: Open Heart Surgery;  Laterality: N/A;  . Intraoperative transesophageal echocardiogram N/A 03/06/2012    Procedure: INTRAOPERATIVE TRANSESOPHAGEAL ECHOCARDIOGRAM;  Surgeon: Rexene Alberts, MD;  Location: [place];  Service: Open Heart Surgery;  Laterality: N/A;  . Endovein harvest of greater saphenous vein Right 03/06/2012    Procedure: ENDOVEIN HARVEST OF GREATER SAPHENOUS VEIN;  Surgeon: Rexene Alberts, MD;  Location: [place];  Service: Open Heart Surgery;  Laterality: Right;    Social History:  reports that he has quit smoking. He has  never used smokeless tobacco. He reports that he drinks about 0.6 ounces of alcohol per week. His drug history is not on file.  Allergies:  Allergies  Allergen Reactions  . Bee Venom Anaphylaxis     (Not in a hospital admission)  Blood pressure 148/74, pulse 68, temperature 97.5 F (36.4 C), temperature source Oral, resp. rate 18, SpO2 99.00%. Physical Exam: General: pleasant, WD, WN white male who is laying in bed in NAD and appears younger than stated age 54: head is normocephalic, atraumatic.  Sclera are noninjected.  PERRL.  Ears and nose without any masses or lesions.  Mouth is pink and moist Heart: regular, rate, and rhythm.  Normal  s1,s2. No obvious murmurs, gallops, or rubs noted.  Palpable radial and pedal pulses bilaterally, well healed incision on chest Lungs: CTAB, no wheezes, rhonchi, or rales noted.  Respiratory effort nonlabored Abd: soft, mild RUQ tenderness (just had morphine), ND, +BS, no masses, hernias, or organomegaly, he does have several small 45mm incisions that are still healing and occasionally drain serous fluid, obese  MS: all 4 extremities are essentially symmetrical with no cyanosis, clubbing, except right LE with significant pitting edema.  He is having significant serous drainage from his surgical sites as his sheets are covered with set and dry areas of staining from drainage.  His LLE has some edema, but nothing compared to the right.  No pain, erythema, or evidence of infection Skin: warm and dry with no masses, lesions, or rashes Psych: A&Ox3 with an appropriate affect.    Results for orders placed during the hospital encounter of 03/22/12 (from the past 48 hour(s))  CBC     Status: Abnormal   Collection Time    03/22/12  6:45 AM      Result Value Range   WBC 6.4  4.0 - 10.5 K/uL   RBC 3.00 (*) 4.22 - 5.81 MIL/uL   Hemoglobin 9.2 (*) 13.0 - 17.0 g/dL   HCT 27.6 (*) 39.0 - 52.0 %   MCV 92.0  78.0 - 100.0 fL   MCH 30.7  26.0 - 34.0 pg   MCHC 33.3  30.0 - 36.0 g/dL   RDW 16.1 (*) 11.5 - 15.5 %   Platelets 267  150 - 400 K/uL  COMPREHENSIVE METABOLIC PANEL     Status: Abnormal   Collection Time    03/22/12  6:45 AM      Result Value Range   Sodium 134 (*) 135 - 145 mEq/L   Potassium 4.5  3.5 - 5.1 mEq/L   Chloride 99  96 - 112 mEq/L   CO2 23  19 - 32 mEq/L   Glucose, Bld 126 (*) 70 - 99 mg/dL   BUN 18  6 - 23 mg/dL   Creatinine, Ser 1.22  0.50 - 1.35 mg/dL   Calcium 9.0  8.4 - 10.5 mg/dL   Total Protein 6.6  6.0 - 8.3 g/dL   Albumin 2.9 (*) 3.5 - 5.2 g/dL   AST 345 (*) 0 - 37 U/L   ALT 196 (*) 0 - 53 U/L   Alkaline Phosphatase 359 (*) 39 - 117 U/L   Total Bilirubin 1.8 (*) 0.3 -  1.2 mg/dL   GFR calc non Af Amer 53 (*) >90 mL/min   GFR calc Af Amer 61 (*) >90 mL/min   Comment:            The eGFR has been calculated     using the CKD EPI equation.  This calculation has not been     validated in all clinical     situations.     eGFR's persistently     <90 mL/min signify     possible Chronic Kidney Disease.  LIPASE, BLOOD     Status: Abnormal   Collection Time    03/22/12  6:45 AM      Result Value Range   Lipase 140 (*) 11 - 59 U/L  URINALYSIS, ROUTINE W REFLEX MICROSCOPIC     Status: Abnormal   Collection Time    03/22/12  8:45 AM      Result Value Range   Color, Urine AMBER (*) YELLOW   Comment: BIOCHEMICALS MAY BE AFFECTED BY COLOR   APPearance CLEAR  CLEAR   Specific Gravity, Urine 1.021  1.005 - 1.030   pH 7.0  5.0 - 8.0   Glucose, UA NEGATIVE  NEGATIVE mg/dL   Hgb urine dipstick MODERATE (*) NEGATIVE   Bilirubin Urine NEGATIVE  NEGATIVE   Ketones, ur NEGATIVE  NEGATIVE mg/dL   Protein, ur NEGATIVE  NEGATIVE mg/dL   Urobilinogen, UA 1.0  0.0 - 1.0 mg/dL   Nitrite NEGATIVE  NEGATIVE   Leukocytes, UA NEGATIVE  NEGATIVE  URINE MICROSCOPIC-ADD ON     Status: None   Collection Time    03/22/12  8:45 AM      Result Value Range   WBC, UA 0-2  <3 WBC/hpf   RBC / HPF 21-50  <3 RBC/hpf  POCT I-STAT TROPONIN I     Status: Abnormal   Collection Time    03/22/12 10:32 AM      Result Value Range   Troponin i, poc 0.19 (*) 0.00 - 0.08 ng/mL   Comment NOTIFIED PHYSICIAN     Comment 3            Comment: Due to the release kinetics of cTnI,     a negative result within the first hours     of the onset of symptoms does not rule out     myocardial infarction with certainty.     If myocardial infarction is still suspected,     repeat the test at appropriate intervals.  CK TOTAL AND CKMB     Status: None   Collection Time    03/22/12 11:03 AM      Result Value Range   Total CK 35  7 - 232 U/L   CK, MB 3.3  0.3 - 4.0 ng/mL   Relative Index RELATIVE  INDEX IS INVALID  0.0 - 2.5   Comment: WHEN CK < 100 U/L              US Abdomen Complete  03/22/2012  *RADIOLOGY REPORT*  Clinical Data:  Pain, evaluate for possible cholecystitis  COMPLETE ABDOMINAL ULTRASOUND  Comparison:  None.  Findings:  Gallbladder:  There are numerous small calculi and there is sludge all layering dependently within the gallbladder lumen.  Gallbladder wall measures 4 mm in thickness.  There is no sonographic Murphy's sign.  Common bile duct:  Normal at 6 mm  Liver:  No focal lesion identified.  Within normal limits in parenchymal echogenicity.  IVC:  Appears normal.  Pancreas:  No focal abnormality seen.  Spleen:  Normal at 6 cm  Right Kidney:  12 cm in length.  Normal except for 3 cm simple cyst upper pole.  Left Kidney:  Normal, measuring 12 cm  Abdominal aorta:  No aneurysm identified.  IMPRESSION: Cholelithiasis with  gallbladder sludge and gallbladder wall thickening.  No sonographic Murphy's sign or biliary dilatation. Although nonspecific, the findings can be seen with cholecystitis. If clinically appropriate, consider hepatic biliary scan.   Original Report Authenticated By: Skipper Cliche, M.D.    Ct Abdomen Pelvis W Contrast  03/22/2012  *RADIOLOGY REPORT*  Clinical Data: Right upper quadrant pain.  Concern for gallstones or obstruction.  CT ABDOMEN AND PELVIS WITH CONTRAST  Technique:  Multidetector CT imaging of the abdomen and pelvis was performed following the standard protocol during bolus administration of intravenous contrast.  Contrast: 17mL OMNIPAQUE IOHEXOL 300 MG/ML  SOLN  Comparison: CT 07/02/2009, ultrasound 03/22/2012  Findings: There are bilateral pleural effusions and associated basilar atelectasis.  No pericardial fluid.  There is no focal hepatic lesion.  There two gallstones in the gallbladder.  There is mild gallbladder wall thickening. There is pericholecystic fluid seen on the coronal projection.  The gallbladder is mildy distended measuring 3.7 cm.   Common bile duct is normal caliber.  There is no intrahepatic biliary dilatation. The pancreas, spleen, and adrenal glands are normal.  There is simple cyst within the right kidney.  The stomach, small bowel, appendix, cecum are normal.  Multiple diverticula throughout the colon without evidence acute inflammation.  Abdominal aorta normal caliber.  No retroperitoneal periportal lymphadenopathy.  No free fluid the pelvis.  The prostate gland bladder normal. Small fat filled inguinal hernias are present.  The left and Review of  bone windows demonstrates no aggressive osseous lesions.  IMPRESSION:  1.  Gallbladder wall thickening and pericholecystic fluid are concerning for acute cholecystitis. Gallstones and sludge present on ultrasound.  2.  Extensive colonic diverticulosis without diverticulitis. 3.  Bilateral pleural effusions and passive atelectasis.   Original Report Authenticated By: Suzy Bouchard, M.D.        Assessment/Plan 1. Acute cholecystitis 2. CAD, s/p CABGx3 2 weeks ago Patient Active Problem List  Diagnosis  . CAD (coronary artery disease)  . Hyperlipidemia  . Obesity (BMI 30-39.9)  . Hypertension  . Chronic venous insufficiency  . Gout  . BPH (benign prostatic hypertrophy)  . NSTEMI (non-ST elevated myocardial infarction)  . Atrial fibrillation, resolved s/p MAZE procedure  . Acute systolic heart failure  .   .   . Ascending aorta dilatation   Plan: 1. Agree with medicine admission. 2. Hold Xarelto, if ok with cardiology.  Ideally, patient needs to be off xarelto prior to surgery. 3. Patient will need pre-op evaluation by cardiology.  He will need an evaluation by TCTS as well to evaluate his lower extremities and make recommendations as per them. 4. Will start Unasyn for his gallbladder.  His LFTs are elevated. Will follow these.  If his TB continues to rise, he may need a GI consult, but he does not need one right now.  If we can hold his xarelto and manage his  abdominal symptoms well, we would like to give the xarelto a day or two to start wearing off prior to operating. 5. For now, he can have clear liquids. 6. We will follow along.  I discussed all of this with the patient and his wife who understand.  Tildon Silveria E 03/22/2012, 1:01 PM Pager: [phone]

## 2012-03-22 NOTE — ED Provider Notes (Signed)
History     CSN: [id number]  Arrival date & time 03/22/12  [id number]   First MD Initiated Contact with Patient 03/22/12 0715      Chief Complaint  Patient presents with  . Abdominal Pain  . Nausea  . Emesis    (Consider location/radiation/quality/duration/timing/severity/associated sxs/prior treatment) HPI Comments: Patient with a history of recent CABG sixteen days ago presents with a chief complaint of abdominal pain.  Pain is worse in the RUQ.  He reports that he had sudden onset of pain around 11 PM last evening.  He has never had pain like this before.  He describes the pain as a tightness.  The pain does not radiate.  He reports that the pain has been associated with nausea and he has been retching.  He denies chest pain or SOB.  He has not taken anything for his pain prior to arrival.  He denies regular use of Tylenol.  He denies prior abdominal surgeries.  Last BM was yesterday and was normal.  No blood in his stool.  He denies fever or chills.  The history is provided by the patient.    Past Medical History  Diagnosis Date  . Hypertension   . Angina   . Coronary artery disease   . CAD (coronary artery disease)     Cath April 2012 40% left main, 50% LAD, 40% OM, occluded RCA with L-R collaterals   . Hypertension   . BPH (benign prostatic hypertrophy)   . Chronic venous insufficiency   . Gout   . Hyperlipidemia   . Obesity (BMI 30-39.9)   . Glaucoma   . Gout   . Acute coronary syndrome 02/29/2012  . Atrial fibrillation 02/29/2012    Recurrent paroxysmal, new-onset  . Acute systolic heart failure 0000000  . S/P CABG x 3 03/06/2012    LIMA to LAD, SVG to OM, SVG to RCA, EVH via right thigh  . S/P Maze operation for atrial fibrillation 03/06/2012    Complete bilateral lesions set using bipolar radiofrequency and cryothermy ablation with clipping of LA appendage  . Ascending aorta dilatation 03/13/2012    Fusiform dilatation of the ascending thoracic aorta discovered during  surgery, max diameter 4.2-4.3 cm    Past Surgical History  Procedure Laterality Date  . Cardiac catheterization    . Prostatectomy    . Cataract extraction, bilateral    . Coronary artery bypass graft N/A 03/06/2012    Procedure: CORONARY ARTERY BYPASS GRAFTING (CABG);  Surgeon: Rexene Alberts, MD;  Location: [place];  Service: Open Heart Surgery;  Laterality: N/A;  . Maze N/A 03/06/2012    Procedure: MAZE;  Surgeon: Rexene Alberts, MD;  Location: [place];  Service: Open Heart Surgery;  Laterality: N/A;  . Intraoperative transesophageal echocardiogram N/A 03/06/2012    Procedure: INTRAOPERATIVE TRANSESOPHAGEAL ECHOCARDIOGRAM;  Surgeon: Rexene Alberts, MD;  Location: [place];  Service: Open Heart Surgery;  Laterality: N/A;  . Endovein harvest of greater saphenous vein Right 03/06/2012    Procedure: ENDOVEIN HARVEST OF GREATER SAPHENOUS VEIN;  Surgeon: Rexene Alberts, MD;  Location: [place];  Service: Open Heart Surgery;  Laterality: Right;    Family History  Problem Relation Age of Onset  . Dementia Mother   . Stroke Brother     History  Substance Use Topics  . Smoking status: Former Research scientist (life sciences)  . Smokeless tobacco: Never Used  . Alcohol Use: 0.6 oz/week    1 Shots of liquor per week  Comment: weekly      Review of Systems  Constitutional: Negative for fever and chills.  Respiratory: Negative for shortness of breath.   Cardiovascular: Negative for chest pain.  Gastrointestinal: Positive for nausea, vomiting, abdominal pain and abdominal distention. Negative for diarrhea, constipation and blood in stool.  All other systems reviewed and are negative.    Allergies  Bee venom  Home Medications   Current Outpatient Rx  Name  Route  Sig  Dispense  Refill  . allopurinol (ZYLOPRIM) 300 MG tablet   Oral   Take 150 mg by mouth Daily.         Marland Kitchen amiodarone (PACERONE) 200 MG tablet   Oral   Take 1 tablet (200 mg total) by mouth 2 (two) times daily after a meal.   60 tablet   1   .  aspirin 81 MG tablet   Oral   Take 81 mg by mouth daily.         Marland Kitchen atenolol (TENORMIN) 25 MG tablet   Oral   Take 1 tablet (25 mg total) by mouth daily.   30 tablet   1   . cholecalciferol (VITAMIN D) 1000 UNITS tablet   Oral   Take 1,000 Units by mouth daily.         . CRESTOR 20 MG tablet   Oral   Take 20 mg by mouth Daily.         . fish oil-omega-3 fatty acids 1000 MG capsule   Oral   Take 1 g by mouth daily.          Marland Kitchen galantamine (RAZADYNE ER) 8 MG 24 hr capsule   Oral   Take 8 mg by mouth Daily.         Marland Kitchen NITROSTAT 0.4 MG SL tablet   Sublingual   Place 1 tablet under the tongue every 5 (five) minutes x 3 doses as needed for chest pain.          Marland Kitchen oxyCODONE (OXY IR/ROXICODONE) 5 MG immediate release tablet   Oral   Take 1-2 tablets (5-10 mg total) by mouth every 4 (four) hours as needed.   50 tablet   0   . PRESCRIPTION MEDICATION   Intramuscular   Inject 1 applicator into the muscle every 8 (eight) weeks. Allergy shots for Bee Venom allergy         . psyllium (METAMUCIL) 58.6 % packet   Oral   Take 1 packet by mouth daily.         . rivaroxaban (XARELTO) 15 MG TABS tablet   Oral   Take 1 tablet (15 mg total) by mouth daily with supper.   30 tablet   1   . vitamin B-12 (CYANOCOBALAMIN) 1000 MCG tablet   Oral   Take 500 mcg by mouth daily.         Marland Kitchen ZETIA 10 MG tablet   Oral   Take 10 mg by mouth Daily.         . cyanocobalamin (,VITAMIN B-12,) 1000 MCG/ML injection   Intramuscular   Inject 1,000 mcg into the muscle See admin instructions. Every three weeks         . EPINEPHrine (EPIPEN 2-PAK) 0.3 mg/0.3 mL DEVI   Intramuscular   Inject 0.3 mg into the muscle See admin instructions. For severe allergic reaction           BP 125/61  Pulse 68  Temp(Src) 98.1 F (36.7 C) (Oral)  Resp  19  SpO2 100%  Physical Exam  Nursing note and vitals reviewed. Constitutional: He appears well-developed and well-nourished. No  distress.  HENT:  Head: Normocephalic and atraumatic.  Mouth/Throat: Oropharynx is clear and moist.  Neck: Normal range of motion. Neck supple.  Cardiovascular: Normal rate, regular rhythm, normal heart sounds and intact distal pulses.   Pulmonary/Chest: Effort normal and breath sounds normal. No respiratory distress. He has no wheezes. He has no rales.  Chest incision appears to be healing well  Abdominal: Bowel sounds are normal. He exhibits distension. He exhibits no mass. There is tenderness in the right upper quadrant. There is guarding and positive Murphy's sign. There is no rebound.  Musculoskeletal: Normal range of motion.  Lower extremity pitting edema bilaterally, worse on the right.  Right lower extremity draining clear fluid  Neurological: He is alert.  Skin: Skin is warm and dry. He is not diaphoretic.  Psychiatric: He has a normal mood and affect.    ED Course  Procedures (including critical care time)  Labs Reviewed  CBC - Abnormal; Notable for the following:    RBC 3.00 (*)    Hemoglobin 9.2 (*)    HCT 27.6 (*)    RDW 16.1 (*)    All other components within normal limits  COMPREHENSIVE METABOLIC PANEL - Abnormal; Notable for the following:    Sodium 134 (*)    Glucose, Bld 126 (*)    Albumin 2.9 (*)    AST 345 (*)    ALT 196 (*)    Alkaline Phosphatase 359 (*)    Total Bilirubin 1.8 (*)    GFR calc non Af Amer 53 (*)    GFR calc Af Amer 61 (*)    All other components within normal limits  LIPASE, BLOOD - Abnormal; Notable for the following:    Lipase 140 (*)    All other components within normal limits  URINALYSIS, ROUTINE W REFLEX MICROSCOPIC   US Abdomen Complete  03/22/2012  *RADIOLOGY REPORT*  Clinical Data:  Pain, evaluate for possible cholecystitis  COMPLETE ABDOMINAL ULTRASOUND  Comparison:  None.  Findings:  Gallbladder:  There are numerous small calculi and there is sludge all layering dependently within the gallbladder lumen.  Gallbladder wall  measures 4 mm in thickness.  There is no sonographic Murphy's sign.  Common bile duct:  Normal at 6 mm  Liver:  No focal lesion identified.  Within normal limits in parenchymal echogenicity.  IVC:  Appears normal.  Pancreas:  No focal abnormality seen.  Spleen:  Normal at 6 cm  Right Kidney:  12 cm in length.  Normal except for 3 cm simple cyst upper pole.  Left Kidney:  Normal, measuring 12 cm  Abdominal aorta:  No aneurysm identified.  IMPRESSION: Cholelithiasis with gallbladder sludge and gallbladder wall thickening.  No sonographic Murphy's sign or biliary dilatation. Although nonspecific, the findings can be seen with cholecystitis. If clinically appropriate, consider hepatic biliary scan.   Original Report Authenticated By: Skipper Cliche, M.D.    Ct Abdomen Pelvis W Contrast  03/22/2012  *RADIOLOGY REPORT*  Clinical Data: Right upper quadrant pain.  Concern for gallstones or obstruction.  CT ABDOMEN AND PELVIS WITH CONTRAST  Technique:  Multidetector CT imaging of the abdomen and pelvis was performed following the standard protocol during bolus administration of intravenous contrast.  Contrast: 126mL OMNIPAQUE IOHEXOL 300 MG/ML  SOLN  Comparison: CT 07/02/2009, ultrasound 03/22/2012  Findings: There are bilateral pleural effusions and associated basilar atelectasis.  No pericardial  fluid.  There is no focal hepatic lesion.  There two gallstones in the gallbladder.  There is mild gallbladder wall thickening. There is pericholecystic fluid seen on the coronal projection.  The gallbladder is mildy distended measuring 3.7 cm.  Common bile duct is normal caliber.  There is no intrahepatic biliary dilatation. The pancreas, spleen, and adrenal glands are normal.  There is simple cyst within the right kidney.  The stomach, small bowel, appendix, cecum are normal.  Multiple diverticula throughout the colon without evidence acute inflammation.  Abdominal aorta normal caliber.  No retroperitoneal periportal  lymphadenopathy.  No free fluid the pelvis.  The prostate gland bladder normal. Small fat filled inguinal hernias are present.  The left and Review of  bone windows demonstrates no aggressive osseous lesions.  IMPRESSION:  1.  Gallbladder wall thickening and pericholecystic fluid are concerning for acute cholecystitis. Gallstones and sludge present on ultrasound.  2.  Extensive colonic diverticulosis without diverticulitis. 3.  Bilateral pleural effusions and passive atelectasis.   Original Report Authenticated By: Suzy Bouchard, M.D.      No diagnosis found.  Dr. Sabra Heck has discussed the patient with Dr. Donne Hazel with Surgery.  He reports that surgery will be available for consult, but recommends medical admission.  Patient currently on Xeralto.  Therefore, cannot have surgery emergently.  Discussed with Dr. Karleen Hampshire with [facility] who has agreed to admit the patient.  MDM  Patient with recent CABG presents today with RUQ abdominal pain onset last evening.  Abdominal Ultrasound shows questionable Cholecystitis.  CT ab/pelvis shows concern for Cholecystitis.  Elevated transaminases.  General surgery consulted and patient admitted to [facility] service for additional management.           Sherlyn Lees [place], PA-C 03/22/12 1418

## 2012-03-23 ENCOUNTER — Inpatient Hospital Stay (HOSPITAL_COMMUNITY): Payer: Medicare Other

## 2012-03-23 DIAGNOSIS — N400 Enlarged prostate without lower urinary tract symptoms: Secondary | ICD-10-CM

## 2012-03-23 DIAGNOSIS — I77810 Thoracic aortic ectasia: Secondary | ICD-10-CM

## 2012-03-23 LAB — CBC
HCT: 26.5 % — ABNORMAL LOW (ref 39.0–52.0)
Hemoglobin: 8.6 g/dL — ABNORMAL LOW (ref 13.0–17.0)
MCH: 29.6 pg (ref 26.0–34.0)
MCV: 91.1 fL (ref 78.0–100.0)
RBC: 2.91 MIL/uL — ABNORMAL LOW (ref 4.22–5.81)
WBC: 7.9 10*3/uL (ref 4.0–10.5)

## 2012-03-23 LAB — COMPREHENSIVE METABOLIC PANEL
AST: 162 U/L — ABNORMAL HIGH (ref 0–37)
Albumin: 2.8 g/dL — ABNORMAL LOW (ref 3.5–5.2)
Chloride: 103 mEq/L (ref 96–112)
Creatinine, Ser: 1.14 mg/dL (ref 0.50–1.35)
Total Bilirubin: 1.9 mg/dL — ABNORMAL HIGH (ref 0.3–1.2)
Total Protein: 6 g/dL (ref 6.0–8.3)

## 2012-03-23 MED ORDER — SALINE SPRAY 0.65 % NA SOLN
1.0000 | NASAL | Status: DC | PRN
  Filled 2012-03-23: qty 44

## 2012-03-23 MED ORDER — METOPROLOL TARTRATE 1 MG/ML IV SOLN
5.0000 mg | INTRAVENOUS | Status: DC | PRN
  Filled 2012-03-23: qty 5

## 2012-03-23 NOTE — Progress Notes (Signed)
[facility] PROGRESS NOTE   [name] [name] is well known to me from recent hospitalization and surgery He seems to be doing well from a cardiovascular standpoint and he's maintaining NSR His sternotomy is healing very nicely, and RLE vein harvest incisions look fine although they are still draining some serous drainage Clinically he remains volume overloaded and has significant LE edema (R>L) but I would not push diuretic therapy until after he has recovered from cholecytectomy Will follow periodically, but please call if specific questions arise   [name] [name],[name] [name] 03/23/2012 1:36 PM

## 2012-03-23 NOTE — ED Provider Notes (Signed)
  Medical screening examination/treatment/procedure(s) were conducted as a shared visit with non-physician practitioner(s) and myself.  I personally evaluated the patient during the encounter  Please see my separate respective documentation pertaining to this patient encounter   Johnna Acosta, MD 03/23/12 [phone]

## 2012-03-23 NOTE — Progress Notes (Signed)
Subjective:  Feels well. No SOB. Has OSA.   Objective:  Vital Signs in the last 24 hours: Temp:  [97.4 F (36.3 C)-98.1 F (36.7 C)] 97.4 F (36.3 C) (03/23 0514) Pulse Rate:  [60-70] 66 (03/23 0514) Resp:  [16-19] 18 (03/23 0514) BP: (111-159)/(63-82) 125/66 mmHg (03/23 0514) SpO2:  [96 %-100 %] 97 % (03/23 0514) Weight:  [105.189 kg (231 lb 14.4 oz)] 105.189 kg (231 lb 14.4 oz) (03/22 1500)  Intake/Output from previous day: 03/22 0701 - 03/23 0700 In: 1877 [I.V.:1877] Out: -    Physical Exam: General: Well developed, well nourished, in no acute distress. Head:  Normocephalic and atraumatic. Lungs: Clear to auscultation and percussion. Heart: Normal S1 and S2.  No murmur, rubs or gallops. Scar well healing.  Abdomen: soft, non-tender, positive bowel sounds. Extremities: No clubbing or cyanosis. RLE 1+ edema. Neurologic: Alert and oriented x 3.    Lab Results:  Recent Labs  03/22/12 1527 03/23/12 0555  WBC 8.1 7.9  HGB 9.1* 8.6*  PLT 253 329    Recent Labs  03/22/12 0645 03/22/12 1527 03/23/12 0555  NA 134*  --  136  K 4.5  --  4.6  CL 99  --  103  CO2 23  --  20  GLUCOSE 126*  --  99  BUN 18  --  13  CREATININE 1.22 1.21 1.14   No results found for this basename: TROPONINI, CK, MB,  in the last 72 hours Hepatic Function Panel  Recent Labs  03/23/12 0555  PROT 6.0  ALBUMIN 2.8*  AST 162*  ALT 194*  ALKPHOS 324*  BILITOT 1.9*   Imaging: US Abdomen Complete  03/22/2012  *RADIOLOGY REPORT*  Clinical Data:  Pain, evaluate for possible cholecystitis  COMPLETE ABDOMINAL ULTRASOUND  Comparison:  None.  Findings:  Gallbladder:  There are numerous small calculi and there is sludge all layering dependently within the gallbladder lumen.  Gallbladder wall measures 4 mm in thickness.  There is no sonographic Murphy's sign.  Common bile duct:  Normal at 6 mm  Liver:  No focal lesion identified.  Within normal limits in parenchymal echogenicity.  IVC:  Appears  normal.  Pancreas:  No focal abnormality seen.  Spleen:  Normal at 6 cm  Right Kidney:  12 cm in length.  Normal except for 3 cm simple cyst upper pole.  Left Kidney:  Normal, measuring 12 cm  Abdominal aorta:  No aneurysm identified.  IMPRESSION: Cholelithiasis with gallbladder sludge and gallbladder wall thickening.  No sonographic Murphy's sign or biliary dilatation. Although nonspecific, the findings can be seen with cholecystitis. If clinically appropriate, consider hepatic biliary scan.   Original Report Authenticated By: Skipper Cliche, M.D.    Ct Abdomen Pelvis W Contrast  03/22/2012  *RADIOLOGY REPORT*  Clinical Data: Right upper quadrant pain.  Concern for gallstones or obstruction.  CT ABDOMEN AND PELVIS WITH CONTRAST  Technique:  Multidetector CT imaging of the abdomen and pelvis was performed following the standard protocol during bolus administration of intravenous contrast.  Contrast: 162mL OMNIPAQUE IOHEXOL 300 MG/ML  SOLN  Comparison: CT 07/02/2009, ultrasound 03/22/2012  Findings: There are bilateral pleural effusions and associated basilar atelectasis.  No pericardial fluid.  There is no focal hepatic lesion.  There two gallstones in the gallbladder.  There is mild gallbladder wall thickening. There is pericholecystic fluid seen on the coronal projection.  The gallbladder is mildy distended measuring 3.7 cm.  Common bile duct is normal caliber.  There is no  intrahepatic biliary dilatation. The pancreas, spleen, and adrenal glands are normal.  There is simple cyst within the right kidney.  The stomach, small bowel, appendix, cecum are normal.  Multiple diverticula throughout the colon without evidence acute inflammation.  Abdominal aorta normal caliber.  No retroperitoneal periportal lymphadenopathy.  No free fluid the pelvis.  The prostate gland bladder normal. Small fat filled inguinal hernias are present.  The left and Review of  bone windows demonstrates no aggressive osseous lesions.   IMPRESSION:  1.  Gallbladder wall thickening and pericholecystic fluid are concerning for acute cholecystitis. Gallstones and sludge present on ultrasound.  2.  Extensive colonic diverticulosis without diverticulitis. 3.  Bilateral pleural effusions and passive atelectasis.   Original Report Authenticated By: Suzy Bouchard, M.D.    Dg Chest Port 1 View  03/23/2012  *RADIOLOGY REPORT*  Clinical Data: CHF  PORTABLE CHEST - 1 VIEW  Comparison: 03/11/2012  Findings: Previous CABG.  Stable mild cardiomegaly.  Left retrocardiac atelectasis/consolidation, slightly increased.  Right lung clear. No definite effusion.  IMPRESSION:  1.  Some interval increase in left retrocardiac atelectasis/consolidation.   Original Report Authenticated By: D. Wallace Going, MD    Personally viewed.   Telemetry: SR, occas PVC Personally viewed.    Assessment/Plan:    79 year old male with severe coronary artery disease status post bypass surgery on 3/6/14x3 by Dr. Roxy Manns, LIMA to LAD, SVG to OM, SVG to right coronary artery, MAZE procedure with clipping of left atrial appendage, prior atrial fibrillation on chronic anticoagulation who presents with acute cholecystitis.  -Holding Xarelto, (2 doses will be held by Monday) -Discussed with Dr. Roxy Manns Diona Fanti -Watch IVF -Proceed with surgery Monday if needed.  -No Afib.  -Stopped amiodarone/Crestor due to elevated LFTs.   SKAINS, [place] 03/23/2012, 10:54 AM

## 2012-03-23 NOTE — Progress Notes (Addendum)
[facility]                                                                                Patient Demographics  [name] [name], is a 79 y.o. male, DOB - 09/27/1926, WV:[id number], [place] date - 03/22/2012  Admitting Physician Hosie Poisson, MD  Outpatient Primary MD for the patient is [name], MD  LOS - 1   Chief Complaint  Patient presents with  . Abdominal Pain  . Nausea  . Emesis        Assessment & Plan   Summary  This 79 year old occasioned gentleman who had CABG 2 weeks ago, also has atrial fibrillation and is on xaralto and follows with Dr. Marlou Porch presented to the hospital with right upper quadrant abdominal pain, in the ER he was diagnosed with acute cholecystitis, general surgery saw the patient and requested medicine for admission, patient is stable. He scheduled for surgery on Monday. Cardiology and general surgery following.    1. Acute cholecystitis. Surgery following the patient, surgery planned tentatively for Monday i.e. 03/24/2012, xaralto being held, continue IV Unasyn, currently clinically stable. Clear liquid diet for now.   2. History of CAD with recent CABG 2 weeks ago. Continue beta blocker aspirin, perioperative cardiac risk is minimal due to recent revascularization. Appreciate cardiology followup.   3. History of paroxysmal atrial fibrillation. Is in sinus, continue home dose beta blocker, baby aspirin, xaralto held for pending surgery. Will order when necessary IV Lopressor.    4. Intermittent right nostril nosebleeds. Has been a chronic problem, nosebleed is stable for now, xaralto being held, he is on baby aspirin, we will monitor, nasal saline spray will be provided for moisturizing nasal passage. Advised not to pick nose.   5. Hypertension stable on home dose beta blocker continue to monitor.    6. Chronic systolic CHF. Echo early this year was suggestive of an EF of 45-50%, currently compensated, we will  monitor closely, cautious with IV fluids. Will get a baseline chest x-ray     Code Status: Full  Family Communication: Patient no family around  Disposition Plan: Home   Procedures right upper quadrant ultrasound, CT abdomen pelvis, scheduled for cholecystectomy on 03/24/2012   Consults  Gen. surgery, cardiology   DVT Prophylaxis  Lovenox   Lab Results  Component Value Date   PLT 329 03/23/2012    Medications  Scheduled Meds: . aspirin EC  81 mg Oral Daily  . atenolol  25 mg Oral Daily  . enoxaparin (LOVENOX) injection  40 mg Subcutaneous Q24H  . galantamine  8 mg Oral Q breakfast   Continuous Infusions: . sodium chloride 125 mL/hr at 03/23/12 0615   PRN Meds:.HYDROcodone-acetaminophen, metoprolol, morphine injection, nitroGLYCERIN, ondansetron (ZOFRAN) IV, ondansetron, sodium chloride  Antibiotics     Anti-infectives   Start     Dose/Rate Route Frequency Ordered Stop   03/22/12 1215  Ampicillin-Sulbactam (UNASYN) 3 g in sodium chloride 0.9 % 100 mL IVPB     3 g 100 mL/hr over 60 Minutes Intravenous  Once 03/22/12 1207 03/22/12 1407       Time Spent in minutes   [email or web address]  on 03/23/2012 at 8:20 AM  Between 7am to 7pm - Pager - [phone]  After 7pm go to [email or web address] - password [email or web address]  And look for the night coverage person covering for me after hours  [facility] Office  [phone]    Subjective:   [name] [name] today has, No headache, No chest pain, minimal right upper quadrant abdominal pain - No Nausea, No new weakness tingling or numbness, No Cough - SOB. Some intermittent right nostril nosebleed which is a chronic problem.  Objective:   Filed Vitals:   03/22/12 1500 03/22/12 1816 03/22/12 2133 03/23/12 0514  BP:  124/69 111/63 125/66  Pulse:  60 60 66  Temp:  98.1 F (36.7 C) 97.6 F (36.4 C) 97.4 F (36.3 C)  TempSrc:  Oral Oral Oral  Resp:  16 17 18   Height: 6\' 2"  (1.88 m)     Weight: 105.189 kg (231 lb  14.4 oz)     SpO2:  98% 96% 97%    Wt Readings from Last 3 Encounters:  03/22/12 105.189 kg (231 lb 14.4 oz)  03/12/12 105.189 kg (231 lb 14.4 oz)  03/12/12 105.189 kg (231 lb 14.4 oz)     Intake/Output Summary (Last 24 hours) at 03/23/12 0820 Last data filed at 03/23/12 0700  Gross per 24 hour  Intake   1877 ml  Output      0 ml  Net   1877 ml    Exam Awake Alert, Oriented X 3, No new F.N deficits, Normal affect [place].AT,PERRAL, mild nosebleed from the right nostril Supple Neck,No JVD, No cervical lymphadenopathy appriciated.  Symmetrical Chest wall movement, Good air movement bilaterally, CTAB RRR,No Gallops,Rubs or new Murmurs, No Parasternal Heave +ve B.Sounds, Abd Soft, mild right upper quadrant tenderness, No organomegaly appriciated, No rebound - guarding or rigidity. No Cyanosis, Clubbing or edema, No new Rash or bruise     Data Review   Micro Results No results found for this or any previous visit (from the past 240 hour(s)).  Radiology Reports   US Abdomen Complete  03/22/2012  *RADIOLOGY REPORT*  Clinical Data:  Pain, evaluate for possible cholecystitis  COMPLETE ABDOMINAL ULTRASOUND  Comparison:  None.  Findings:  Gallbladder:  There are numerous small calculi and there is sludge all layering dependently within the gallbladder lumen.  Gallbladder wall measures 4 mm in thickness.  There is no sonographic Murphy's sign.  Common bile duct:  Normal at 6 mm  Liver:  No focal lesion identified.  Within normal limits in parenchymal echogenicity.  IVC:  Appears normal.  Pancreas:  No focal abnormality seen.  Spleen:  Normal at 6 cm  Right Kidney:  12 cm in length.  Normal except for 3 cm simple cyst upper pole.  Left Kidney:  Normal, measuring 12 cm  Abdominal aorta:  No aneurysm identified.  IMPRESSION: Cholelithiasis with gallbladder sludge and gallbladder wall thickening.  No sonographic Murphy's sign or biliary dilatation. Although nonspecific, the findings can be seen with  cholecystitis. If clinically appropriate, consider hepatic biliary scan.   Original Report Authenticated By: Skipper Cliche, M.D.    Ct Abdomen Pelvis W Contrast  03/22/2012  *RADIOLOGY REPORT*  Clinical Data: Right upper quadrant pain.  Concern for gallstones or obstruction.  CT ABDOMEN AND PELVIS WITH CONTRAST  Technique:  Multidetector CT imaging of the abdomen and pelvis was performed following the standard protocol during bolus administration of intravenous contrast.  Contrast: 121mL OMNIPAQUE IOHEXOL 300 MG/ML  SOLN  Comparison: CT 07/02/2009, ultrasound  03/22/2012  Findings: There are bilateral pleural effusions and associated basilar atelectasis.  No pericardial fluid.  There is no focal hepatic lesion.  There two gallstones in the gallbladder.  There is mild gallbladder wall thickening. There is pericholecystic fluid seen on the coronal projection.  The gallbladder is mildy distended measuring 3.7 cm.  Common bile duct is normal caliber.  There is no intrahepatic biliary dilatation. The pancreas, spleen, and adrenal glands are normal.  There is simple cyst within the right kidney.  The stomach, small bowel, appendix, cecum are normal.  Multiple diverticula throughout the colon without evidence acute inflammation.  Abdominal aorta normal caliber.  No retroperitoneal periportal lymphadenopathy.  No free fluid the pelvis.  The prostate gland bladder normal. Small fat filled inguinal hernias are present.  The left and Review of  bone windows demonstrates no aggressive osseous lesions.  IMPRESSION:  1.  Gallbladder wall thickening and pericholecystic fluid are concerning for acute cholecystitis. Gallstones and sludge present on ultrasound.  2.  Extensive colonic diverticulosis without diverticulitis. 3.  Bilateral pleural effusions and passive atelectasis.   Original Report Authenticated By: Suzy Bouchard, M.D.        CBC  Recent Labs Lab 03/22/12 0645 03/22/12 1527 03/23/12 0555  WBC 6.4 8.1  7.9  HGB 9.2* 9.1* 8.6*  HCT 27.6* 26.4* 26.5*  PLT 267 253 329  MCV 92.0 86.8 91.1  MCH 30.7 29.9 29.6  MCHC 33.3 34.5 32.5  RDW 16.1* 16.0* 16.1*    Chemistries   Recent Labs Lab 03/22/12 0645 03/22/12 1527 03/23/12 0555  NA 134*  --  136  K 4.5  --  4.6  CL 99  --  103  CO2 23  --  20  GLUCOSE 126*  --  99  BUN 18  --  13  CREATININE 1.22 1.21 1.14  CALCIUM 9.0  --  8.4  AST 345*  --  162*  ALT 196*  --  194*  ALKPHOS 359*  --  324*  BILITOT 1.8*  --  1.9*   ------------------------------------------------------------------------------------------------------------------ estimated creatinine clearance is 63.5 ml/min (by C-G formula based on Cr of 1.14). ------------------------------------------------------------------------------------------------------------------ No results found for this basename: HGBA1C,  in the last 72 hours ------------------------------------------------------------------------------------------------------------------ No results found for this basename: CHOL, HDL, LDLCALC, TRIG, CHOLHDL, LDLDIRECT,  in the last 72 hours ------------------------------------------------------------------------------------------------------------------ No results found for this basename: TSH, T4TOTAL, FREET3, T3FREE, THYROIDAB,  in the last 72 hours ------------------------------------------------------------------------------------------------------------------ No results found for this basename: VITAMINB12, FOLATE, FERRITIN, TIBC, IRON, RETICCTPCT,  in the last 72 hours  Coagulation profile  Recent Labs Lab 03/23/12 0555  INR 1.25    No results found for this basename: DDIMER,  in the last 72 hours  Cardiac Enzymes  Recent Labs Lab 03/22/12 1103  CKMB 3.3   ------------------------------------------------------------------------------------------------------------------ No components found with this basename: POCBNP,

## 2012-03-23 NOTE — Progress Notes (Signed)
Subjective: Pain better, no complaints  Objective: Vital signs in last 24 hours: Temp:  [97.4 F (36.3 C)-98.1 F (36.7 C)] 97.4 F (36.3 C) (03/23 0514) Pulse Rate:  [60-70] 66 (03/23 0514) Resp:  [16-19] 18 (03/23 0514) BP: (111-159)/(63-82) 125/66 mmHg (03/23 0514) SpO2:  [96 %-100 %] 97 % (03/23 0514) Weight:  [231 lb 14.4 oz (105.189 kg)] 231 lb 14.4 oz (105.189 kg) (03/22 1500) Last BM Date: 03/21/12  Intake/Output from previous day: 03/22 0701 - 03/23 0700 In: 1877 [I.V.:1877] Out: -  Intake/Output this shift:    General appearance: alert, cooperative and no distress Resp: nonlabored GI: soft, very mild RUQ tenderness, mild distension as well, no peritoneal signs  Lab Results:   Recent Labs  03/22/12 1527 03/23/12 0555  WBC 8.1 7.9  HGB 9.1* 8.6*  HCT 26.4* 26.5*  PLT 253 329   BMET  Recent Labs  03/22/12 0645 03/22/12 1527 03/23/12 0555  NA 134*  --  136  K 4.5  --  4.6  CL 99  --  103  CO2 23  --  20  GLUCOSE 126*  --  99  BUN 18  --  13  CREATININE 1.22 1.21 1.14  CALCIUM 9.0  --  8.4   PT/INR  Recent Labs  03/23/12 0555  LABPROT 15.5*  INR 1.25   ABG No results found for this basename: PHART, PCO2, PO2, HCO3,  in the last 72 hours  Studies/Results: US Abdomen Complete  03/22/2012  *RADIOLOGY REPORT*  Clinical Data:  Pain, evaluate for possible cholecystitis  COMPLETE ABDOMINAL ULTRASOUND  Comparison:  None.  Findings:  Gallbladder:  There are numerous small calculi and there is sludge all layering dependently within the gallbladder lumen.  Gallbladder wall measures 4 mm in thickness.  There is no sonographic Murphy's sign.  Common bile duct:  Normal at 6 mm  Liver:  No focal lesion identified.  Within normal limits in parenchymal echogenicity.  IVC:  Appears normal.  Pancreas:  No focal abnormality seen.  Spleen:  Normal at 6 cm  Right Kidney:  12 cm in length.  Normal except for 3 cm simple cyst upper pole.  Left Kidney:  Normal,  measuring 12 cm  Abdominal aorta:  No aneurysm identified.  IMPRESSION: Cholelithiasis with gallbladder sludge and gallbladder wall thickening.  No sonographic Murphy's sign or biliary dilatation. Although nonspecific, the findings can be seen with cholecystitis. If clinically appropriate, consider hepatic biliary scan.   Original Report Authenticated By: Skipper Cliche, M.D.    Ct Abdomen Pelvis W Contrast  03/22/2012  *RADIOLOGY REPORT*  Clinical Data: Right upper quadrant pain.  Concern for gallstones or obstruction.  CT ABDOMEN AND PELVIS WITH CONTRAST  Technique:  Multidetector CT imaging of the abdomen and pelvis was performed following the standard protocol during bolus administration of intravenous contrast.  Contrast: 147mL OMNIPAQUE IOHEXOL 300 MG/ML  SOLN  Comparison: CT 07/02/2009, ultrasound 03/22/2012  Findings: There are bilateral pleural effusions and associated basilar atelectasis.  No pericardial fluid.  There is no focal hepatic lesion.  There two gallstones in the gallbladder.  There is mild gallbladder wall thickening. There is pericholecystic fluid seen on the coronal projection.  The gallbladder is mildy distended measuring 3.7 cm.  Common bile duct is normal caliber.  There is no intrahepatic biliary dilatation. The pancreas, spleen, and adrenal glands are normal.  There is simple cyst within the right kidney.  The stomach, small bowel, appendix, cecum are normal.  Multiple diverticula throughout  the colon without evidence acute inflammation.  Abdominal aorta normal caliber.  No retroperitoneal periportal lymphadenopathy.  No free fluid the pelvis.  The prostate gland bladder normal. Small fat filled inguinal hernias are present.  The left and Review of  bone windows demonstrates no aggressive osseous lesions.  IMPRESSION:  1.  Gallbladder wall thickening and pericholecystic fluid are concerning for acute cholecystitis. Gallstones and sludge present on ultrasound.  2.  Extensive colonic  diverticulosis without diverticulitis. 3.  Bilateral pleural effusions and passive atelectasis.   Original Report Authenticated By: Suzy Bouchard, M.D.    Dg Chest Port 1 View  03/23/2012  *RADIOLOGY REPORT*  Clinical Data: CHF  PORTABLE CHEST - 1 VIEW  Comparison: 03/11/2012  Findings: Previous CABG.  Stable mild cardiomegaly.  Left retrocardiac atelectasis/consolidation, slightly increased.  Right lung clear. No definite effusion.  IMPRESSION:  1.  Some interval increase in left retrocardiac atelectasis/consolidation.   Original Report Authenticated By: D. Wallace Going, MD     Anti-infectives: Anti-infectives   Start     Dose/Rate Route Frequency Ordered Stop   03/22/12 1215  Ampicillin-Sulbactam (UNASYN) 3 g in sodium chloride 0.9 % 100 mL IVPB     3 g 100 mL/hr over 60 Minutes Intravenous  Once 03/22/12 1207 03/22/12 1407      Assessment/Plan: s/p * No surgery found * He feels better but he appears to have cholecystitis on imaging, labs, and history even though his exam is improved. we are planning on cholecystectomy tomorrow.  I discussed with him the procedure and the risks.  Dr. Grandville Silos will be the surgeon if we have time on the schedule to proceed with cholecystectomy. Continue abx   LOS: 1 day    Madilyn Hook Sohan 03/23/2012

## 2012-03-24 ENCOUNTER — Encounter (HOSPITAL_COMMUNITY): Payer: Self-pay | Admitting: Certified Registered Nurse Anesthetist

## 2012-03-24 ENCOUNTER — Encounter (HOSPITAL_COMMUNITY)
Admission: EM | Disposition: A | Discharge status: Home / Self Care | Payer: Self-pay | Source: Home / Self Care | Attending: Internal Medicine

## 2012-03-24 ENCOUNTER — Inpatient Hospital Stay (HOSPITAL_COMMUNITY): Payer: Medicare Other

## 2012-03-24 ENCOUNTER — Encounter (HOSPITAL_COMMUNITY): Payer: Self-pay | Admitting: Anesthesiology

## 2012-03-24 ENCOUNTER — Inpatient Hospital Stay (HOSPITAL_COMMUNITY): Payer: Medicare Other | Admitting: Anesthesiology

## 2012-03-24 DIAGNOSIS — I872 Venous insufficiency (chronic) (peripheral): Secondary | ICD-10-CM

## 2012-03-24 DIAGNOSIS — E785 Hyperlipidemia, unspecified: Secondary | ICD-10-CM

## 2012-03-24 DIAGNOSIS — M109 Gout, unspecified: Secondary | ICD-10-CM

## 2012-03-24 DIAGNOSIS — K810 Acute cholecystitis: Principal | ICD-10-CM

## 2012-03-24 HISTORY — PX: CHOLECYSTECTOMY: SHX55

## 2012-03-24 LAB — CBC
HCT: 25.3 % — ABNORMAL LOW (ref 39.0–52.0)
Hemoglobin: 8.4 g/dL — ABNORMAL LOW (ref 13.0–17.0)
MCH: 29.2 pg (ref 26.0–34.0)
MCV: 87.8 fL (ref 78.0–100.0)
RBC: 2.88 MIL/uL — ABNORMAL LOW (ref 4.22–5.81)
WBC: 6.1 10*3/uL (ref 4.0–10.5)

## 2012-03-24 LAB — BASIC METABOLIC PANEL
CO2: 18 mEq/L — ABNORMAL LOW (ref 19–32)
Calcium: 8.5 mg/dL (ref 8.4–10.5)
Chloride: 102 mEq/L (ref 96–112)
Creatinine, Ser: 1.07 mg/dL (ref 0.50–1.35)
Glucose, Bld: 90 mg/dL (ref 70–99)

## 2012-03-24 LAB — MAGNESIUM: Magnesium: 2 mg/dL (ref 1.5–2.5)

## 2012-03-24 SURGERY — LAPAROSCOPIC CHOLECYSTECTOMY WITH INTRAOPERATIVE CHOLANGIOGRAM
Anesthesia: General | Site: Abdomen | Wound class: Clean Contaminated

## 2012-03-24 MED ORDER — SODIUM CHLORIDE 0.9 % IR SOLN
Status: DC | PRN
  Administered 2012-03-24: 1000 mL

## 2012-03-24 MED ORDER — ONDANSETRON HCL 4 MG/2ML IJ SOLN
INTRAMUSCULAR | Status: DC | PRN
  Administered 2012-03-24: 4 mg via INTRAVENOUS

## 2012-03-24 MED ORDER — OXYCODONE-ACETAMINOPHEN 5-325 MG PO TABS
1.0000 | ORAL_TABLET | ORAL | Status: DC | PRN
  Administered 2012-03-25: 1 via ORAL
  Filled 2012-03-24: qty 2

## 2012-03-24 MED ORDER — LACTATED RINGERS IV SOLN
INTRAVENOUS | Status: DC | PRN
  Administered 2012-03-24 (×2): via INTRAVENOUS

## 2012-03-24 MED ORDER — ROCURONIUM BROMIDE 100 MG/10ML IV SOLN
INTRAVENOUS | Status: DC | PRN
  Administered 2012-03-24: 10 mg via INTRAVENOUS
  Administered 2012-03-24: 40 mg via INTRAVENOUS
  Administered 2012-03-24: 10 mg via INTRAVENOUS

## 2012-03-24 MED ORDER — ARTIFICIAL TEARS OP OINT
TOPICAL_OINTMENT | OPHTHALMIC | Status: DC | PRN
  Administered 2012-03-24: 1 via OPHTHALMIC

## 2012-03-24 MED ORDER — BUPIVACAINE-EPINEPHRINE 0.25% -1:200000 IJ SOLN
INTRAMUSCULAR | Status: DC | PRN
  Administered 2012-03-24: 25 mL

## 2012-03-24 MED ORDER — CEFAZOLIN SODIUM-DEXTROSE 2-3 GM-% IV SOLR
2.0000 g | Freq: Once | INTRAVENOUS | Status: AC
  Administered 2012-03-24: 2 g via INTRAVENOUS
  Filled 2012-03-24: qty 50

## 2012-03-24 MED ORDER — HYDROMORPHONE HCL PF 1 MG/ML IJ SOLN: 0.2500 mg | INTRAMUSCULAR | Status: DC | PRN

## 2012-03-24 MED ORDER — ENOXAPARIN SODIUM 40 MG/0.4ML ~~LOC~~ SOLN
40.0000 mg | SUBCUTANEOUS | Status: DC
  Filled 2012-03-24: qty 0.4

## 2012-03-24 MED ORDER — SODIUM CHLORIDE 0.9 % IV SOLN
INTRAVENOUS | Status: DC | PRN
  Administered 2012-03-24: 12:00:00

## 2012-03-24 MED ORDER — EPHEDRINE SULFATE 50 MG/ML IJ SOLN
INTRAMUSCULAR | Status: DC | PRN
  Administered 2012-03-24: 15 mg via INTRAVENOUS
  Administered 2012-03-24: 10 mg via INTRAVENOUS
  Administered 2012-03-24: 15 mg via INTRAVENOUS

## 2012-03-24 MED ORDER — LIDOCAINE HCL (CARDIAC) 20 MG/ML IV SOLN
INTRAVENOUS | Status: DC | PRN
  Administered 2012-03-24: 60 mg via INTRAVENOUS

## 2012-03-24 MED ORDER — GLYCOPYRROLATE 0.2 MG/ML IJ SOLN
INTRAMUSCULAR | Status: DC | PRN
  Administered 2012-03-24: 0.6 mg via INTRAVENOUS

## 2012-03-24 MED ORDER — FENTANYL CITRATE 0.05 MG/ML IJ SOLN
INTRAMUSCULAR | Status: DC | PRN
  Administered 2012-03-24: 50 ug via INTRAVENOUS
  Administered 2012-03-24 (×2): 100 ug via INTRAVENOUS

## 2012-03-24 MED ORDER — 0.9 % SODIUM CHLORIDE (POUR BTL) OPTIME
TOPICAL | Status: DC | PRN
  Administered 2012-03-24: 1000 mL

## 2012-03-24 MED ORDER — LIDOCAINE HCL 4 % MT SOLN
OROMUCOSAL | Status: DC | PRN
  Administered 2012-03-24: 4 mL via TOPICAL

## 2012-03-24 MED ORDER — PROPOFOL 10 MG/ML IV BOLUS
INTRAVENOUS | Status: DC | PRN
  Administered 2012-03-24: 110 mg via INTRAVENOUS

## 2012-03-24 MED ORDER — NEOSTIGMINE METHYLSULFATE 1 MG/ML IJ SOLN
INTRAMUSCULAR | Status: DC | PRN
  Administered 2012-03-24: 4 mg via INTRAVENOUS

## 2012-03-24 MED ORDER — ENOXAPARIN SODIUM 40 MG/0.4ML ~~LOC~~ SOLN
40.0000 mg | SUBCUTANEOUS | Status: DC
  Administered 2012-03-25: 40 mg via SUBCUTANEOUS
  Filled 2012-03-24 (×2): qty 0.4

## 2012-03-24 SURGICAL SUPPLY — 47 items
ADH SKN CLS APL DERMABOND .7 (GAUZE/BANDAGES/DRESSINGS) ×1
APPLIER CLIP 5 13 M/L LIGAMAX5 (MISCELLANEOUS) ×2
APPLIER CLIP ROT 10 11.4 M/L (STAPLE)
APR CLP MED LRG 11.4X10 (STAPLE)
APR CLP MED LRG 5 ANG JAW (MISCELLANEOUS) ×1
BAG SPEC RTRVL LRG 6X4 10 (ENDOMECHANICALS) ×1
BLADE SURG ROTATE 9660 (MISCELLANEOUS) ×1 IMPLANT
CANISTER SUCTION 2500CC (MISCELLANEOUS) ×2 IMPLANT
CHLORAPREP W/TINT 26ML (MISCELLANEOUS) ×2 IMPLANT
CLIP APPLIE 5 13 M/L LIGAMAX5 (MISCELLANEOUS) ×1 IMPLANT
CLIP APPLIE ROT 10 11.4 M/L (STAPLE) IMPLANT
CLOTH BEACON ORANGE TIMEOUT ST (SAFETY) ×2 IMPLANT
COVER MAYO STAND STRL (DRAPES) ×2 IMPLANT
COVER SURGICAL LIGHT HANDLE (MISCELLANEOUS) ×2 IMPLANT
DECANTER SPIKE VIAL GLASS SM (MISCELLANEOUS) ×4 IMPLANT
DERMABOND ADVANCED (GAUZE/BANDAGES/DRESSINGS) ×1
DERMABOND ADVANCED .7 DNX12 (GAUZE/BANDAGES/DRESSINGS) ×1 IMPLANT
DRAPE C-ARM 42X72 X-RAY (DRAPES) ×2 IMPLANT
DRAPE UTILITY 15X26 W/TAPE STR (DRAPE) ×4 IMPLANT
ELECT REM PT RETURN 9FT ADLT (ELECTROSURGICAL) ×2
ELECTRODE REM PT RTRN 9FT ADLT (ELECTROSURGICAL) ×1 IMPLANT
ENDOLOOP SUT PDS II  0 18 (SUTURE) ×2
ENDOLOOP SUT PDS II 0 18 (SUTURE) IMPLANT
FILTER SMOKE EVAC LAPAROSHD (FILTER) IMPLANT
GLOVE BIO SURGEON STRL SZ8 (GLOVE) ×2 IMPLANT
GLOVE BIOGEL PI IND STRL 8 (GLOVE) ×1 IMPLANT
GLOVE BIOGEL PI INDICATOR 8 (GLOVE) ×1
GOWN PREVENTION PLUS XLARGE (GOWN DISPOSABLE) ×2 IMPLANT
GOWN STRL NON-REIN LRG LVL3 (GOWN DISPOSABLE) ×6 IMPLANT
KIT BASIN OR (CUSTOM PROCEDURE TRAY) ×2 IMPLANT
KIT ROOM TURNOVER OR (KITS) ×2 IMPLANT
NS IRRIG 1000ML POUR BTL (IV SOLUTION) ×2 IMPLANT
PAD ARMBOARD 7.5X6 YLW CONV (MISCELLANEOUS) ×2 IMPLANT
POUCH SPECIMEN RETRIEVAL 10MM (ENDOMECHANICALS) ×2 IMPLANT
SCISSORS ENDO CVD 5DCS (MISCELLANEOUS) ×1 IMPLANT
SCISSORS LAP 5X35 DISP (ENDOMECHANICALS) IMPLANT
SET CHOLANGIOGRAPH 5 50 .035 (SET/KITS/TRAYS/PACK) ×2 IMPLANT
SET IRRIG TUBING LAPAROSCOPIC (IRRIGATION / IRRIGATOR) ×2 IMPLANT
SPECIMEN JAR SMALL (MISCELLANEOUS) ×2 IMPLANT
SUT VIC AB 4-0 PS2 27 (SUTURE) ×2 IMPLANT
SUT VICRYL 0 UR6 27IN ABS (SUTURE) ×2 IMPLANT
TOWEL OR 17X24 6PK STRL BLUE (TOWEL DISPOSABLE) ×2 IMPLANT
TOWEL OR 17X26 10 PK STRL BLUE (TOWEL DISPOSABLE) ×2 IMPLANT
TRAY LAPAROSCOPIC (CUSTOM PROCEDURE TRAY) ×2 IMPLANT
TROCAR HASSON GELL 12X100 (TROCAR) ×2 IMPLANT
TROCAR Z-THREAD FIOS 5X100MM (TROCAR) ×6 IMPLANT
WATER STERILE IRR 1000ML POUR (IV SOLUTION) IMPLANT

## 2012-03-24 NOTE — Progress Notes (Signed)
Patient ID: [name] [name], male   DOB: 04-Mar-1926, 79 y.o.   MRN: [id number]    Subjective: Pt in restroom.  Wife states he feels ok.  Some SOB secondary to OSA  Objective: Vital signs in last 24 hours: Temp:  [98.2 F (36.8 C)-98.5 F (36.9 C)] 98.2 F (36.8 C) (03/24 0501) Pulse Rate:  [64-68] 66 (03/24 0732) Resp:  [18-19] 18 (03/24 0501) BP: (110-143)/(63-81) 110/63 mmHg (03/24 0732) SpO2:  [97 %-100 %] 100 % (03/24 0732) Last BM Date: 03/21/12  Intake/Output from previous day: 03/23 0701 - 03/24 0700 In: 967.7 [P.O.:75; I.V.:892.7] Out: 1650 [Urine:1650] Intake/Output this shift:    PE: Abd: patient in bathroom, unable to examine  Lab Results:   Recent Labs  03/23/12 0555 03/24/12 0532  WBC 7.9 6.1  HGB 8.6* 8.4*  HCT 26.5* 25.3*  PLT 329 PLATELET CLUMPS NOTED ON SMEAR, COUNT APPEARS ADEQUATE   BMET  Recent Labs  03/23/12 0555 03/24/12 0532  NA 136 135  K 4.6 4.4  CL 103 102  CO2 20 18*  GLUCOSE 99 90  BUN 13 10  CREATININE 1.14 1.07  CALCIUM 8.4 8.5   PT/INR  Recent Labs  03/23/12 0555  LABPROT 15.5*  INR 1.25   CMP     Component Value Date/Time   NA 135 03/24/2012 0532   K 4.4 03/24/2012 0532   CL 102 03/24/2012 0532   CO2 18* 03/24/2012 0532   GLUCOSE 90 03/24/2012 0532   BUN 10 03/24/2012 0532   CREATININE 1.07 03/24/2012 0532   CALCIUM 8.5 03/24/2012 0532   PROT 6.0 03/23/2012 0555   ALBUMIN 2.8* 03/23/2012 0555   AST 162* 03/23/2012 0555   ALT 194* 03/23/2012 0555   ALKPHOS 324* 03/23/2012 0555   BILITOT 1.9* 03/23/2012 0555   GFRNONAA 62* 03/24/2012 0532   GFRAA 72* 03/24/2012 0532   Lipase     Component Value Date/Time   LIPASE 140* 03/22/2012 0645       Studies/Results: US Abdomen Complete  03/22/2012  *RADIOLOGY REPORT*  Clinical Data:  Pain, evaluate for possible cholecystitis  COMPLETE ABDOMINAL ULTRASOUND  Comparison:  None.  Findings:  Gallbladder:  There are numerous small calculi and there is sludge all layering dependently  within the gallbladder lumen.  Gallbladder wall measures 4 mm in thickness.  There is no sonographic Murphy's sign.  Common bile duct:  Normal at 6 mm  Liver:  No focal lesion identified.  Within normal limits in parenchymal echogenicity.  IVC:  Appears normal.  Pancreas:  No focal abnormality seen.  Spleen:  Normal at 6 cm  Right Kidney:  12 cm in length.  Normal except for 3 cm simple cyst upper pole.  Left Kidney:  Normal, measuring 12 cm  Abdominal aorta:  No aneurysm identified.  IMPRESSION: Cholelithiasis with gallbladder sludge and gallbladder wall thickening.  No sonographic Murphy's sign or biliary dilatation. Although nonspecific, the findings can be seen with cholecystitis. If clinically appropriate, consider hepatic biliary scan.   Original Report Authenticated By: Skipper Cliche, M.D.    Ct Abdomen Pelvis W Contrast  03/22/2012  *RADIOLOGY REPORT*  Clinical Data: Right upper quadrant pain.  Concern for gallstones or obstruction.  CT ABDOMEN AND PELVIS WITH CONTRAST  Technique:  Multidetector CT imaging of the abdomen and pelvis was performed following the standard protocol during bolus administration of intravenous contrast.  Contrast: 19mL OMNIPAQUE IOHEXOL 300 MG/ML  SOLN  Comparison: CT 07/02/2009, ultrasound 03/22/2012  Findings: There are bilateral pleural  effusions and associated basilar atelectasis.  No pericardial fluid.  There is no focal hepatic lesion.  There two gallstones in the gallbladder.  There is mild gallbladder wall thickening. There is pericholecystic fluid seen on the coronal projection.  The gallbladder is mildy distended measuring 3.7 cm.  Common bile duct is normal caliber.  There is no intrahepatic biliary dilatation. The pancreas, spleen, and adrenal glands are normal.  There is simple cyst within the right kidney.  The stomach, small bowel, appendix, cecum are normal.  Multiple diverticula throughout the colon without evidence acute inflammation.  Abdominal aorta normal  caliber.  No retroperitoneal periportal lymphadenopathy.  No free fluid the pelvis.  The prostate gland bladder normal. Small fat filled inguinal hernias are present.  The left and Review of  bone windows demonstrates no aggressive osseous lesions.  IMPRESSION:  1.  Gallbladder wall thickening and pericholecystic fluid are concerning for acute cholecystitis. Gallstones and sludge present on ultrasound.  2.  Extensive colonic diverticulosis without diverticulitis. 3.  Bilateral pleural effusions and passive atelectasis.   Original Report Authenticated By: Suzy Bouchard, M.D.    Dg Chest Port 1 View  03/23/2012  *RADIOLOGY REPORT*  Clinical Data: CHF  PORTABLE CHEST - 1 VIEW  Comparison: 03/11/2012  Findings: Previous CABG.  Stable mild cardiomegaly.  Left retrocardiac atelectasis/consolidation, slightly increased.  Right lung clear. No definite effusion.  IMPRESSION:  1.  Some interval increase in left retrocardiac atelectasis/consolidation.   Original Report Authenticated By: D. Wallace Going, MD     Anti-infectives: Anti-infectives   Start     Dose/Rate Route Frequency Ordered Stop   03/22/12 1215  Ampicillin-Sulbactam (UNASYN) 3 g in sodium chloride 0.9 % 100 mL IVPB     3 g 100 mL/hr over 60 Minutes Intravenous  Once 03/22/12 1207 03/22/12 1407       Assessment/Plan  1. Acute cholecystitis, on xarelto 2. S/p CABG  Plan: 1. Cont NPO, to OR today for lap chole.   LOS: 2 days    Chancelor Hardrick E 03/24/2012, 9:06 AM Pager: [phone]

## 2012-03-24 NOTE — Progress Notes (Signed)
RETURNED TO ROOM 6N03, A/OX4, DENIES NAUSEA/PAIN, REORIENTED TO ROOM AND SURROUNDINGS

## 2012-03-24 NOTE — Preoperative (Signed)
Beta Blockers   Reason not to administer Beta Blockers:Hold  beta blocker due to Bradycardia (HR less than 50 bpm) 

## 2012-03-24 NOTE — Transfer of Care (Signed)
Immediate Anesthesia Transfer of Care Note  Patient: [name] [name]  Procedure(s) Performed: Procedure(s): LAPAROSCOPIC CHOLECYSTECTOMY WITH INTRAOPERATIVE CHOLANGIOGRAM (N/A)  Patient Location: PACU  Anesthesia Type:General  Level of Consciousness: awake, alert  and patient cooperative  Airway & Oxygen Therapy: Patient Spontanous Breathing and Patient connected to face mask oxygen  Post-op Assessment: Report given to PACU RN, Post -op Vital signs reviewed and stable and Patient moving all extremities X 4  Post vital signs: Reviewed and stable  Complications: No apparent anesthesia complications

## 2012-03-24 NOTE — Progress Notes (Signed)
I discussed the procedure in detail.   We discussed the risks and benefits of a laparoscopic cholecystectomy and possible cholangiogram including, but not limited to bleeding, infection, injury to surrounding structures such as the intestine or liver, bile leak, retained gallstones, need to convert to an open procedure, prolonged diarrhea, blood clots such as  DVT, common bile duct injury, anesthesia risks, and possible need for additional procedures.  The likelihood of improvement in symptoms and return to the patient's normal status is good. We discussed the typical post-operative recovery course. I also spoke with Dr. Nile Dear at the bedside and Dr. Oletta Lamas from anesthesia regarding the plan.

## 2012-03-24 NOTE — Progress Notes (Signed)
Pt had L hand IV saline lock not charted under LDA's. Removed due to occlusion when trying to flush it. Catheter intact. Site dry and intact, small amount of bleeding with removal.

## 2012-03-24 NOTE — Anesthesia Preprocedure Evaluation (Signed)
Anesthesia Evaluation  Patient identified by MRN, date of birth, ID band Patient awake    Airway Mallampati: II      Dental   Pulmonary neg pulmonary ROS,          Cardiovascular hypertension, + angina + CAD and + Peripheral Vascular Disease + dysrhythmias     Neuro/Psych    GI/Hepatic negative GI ROS, Neg liver ROS,   Endo/Other  negative endocrine ROS  Renal/GU Renal disease     Musculoskeletal   Abdominal   Peds  Hematology   Anesthesia Other Findings   Reproductive/Obstetrics                           Anesthesia Physical Anesthesia Plan  ASA: III  Anesthesia Plan: General   Post-op Pain Management:    Induction: Intravenous  Airway Management Planned: Oral ETT  Additional Equipment:   Intra-op Plan:   Post-operative Plan: Extubation in OR  Informed Consent: I have reviewed the patients History and Physical, chart, labs and discussed the procedure including the risks, benefits and alternatives for the proposed anesthesia with the patient or authorized representative who has indicated his/her understanding and acceptance.   Dental advisory given  Plan Discussed with: CRNA, Anesthesiologist and Surgeon  Anesthesia Plan Comments:         Anesthesia Quick Evaluation

## 2012-03-24 NOTE — Op Note (Signed)
03/22/2012 - 03/24/2012  12:48 PM  PATIENT:  [name] [name]  79 y.o. male  PRE-OPERATIVE DIAGNOSIS:  Acute Cholecystitis  POST-OPERATIVE DIAGNOSIS:  Gangrenous cholecystitis  PROCEDURE:  Procedure(s): LAPAROSCOPIC CHOLECYSTECTOMY WITH INTRAOPERATIVE CHOLANGIOGRAM  SURGEON:  Georganna Skeans, MD  PHYSICIAN ASSISTANT:   ASSISTANTS: Judeth Horn, MD   ANESTHESIA:   local and general  EBL:  Total I/O In: 80 [I.V.:1000] Out: -   BLOOD ADMINISTERED:none  DRAINS: none   SPECIMEN:  Excision  DISPOSITION OF SPECIMEN:  PATHOLOGY  COUNTS:  YES  DICTATION: .Dragon Dictation  Findings: Acute cholecystitis with patchy gangrene   Patient is approximately 2 weeks status post coronary artery bypass grafting. He presented with acute cholecystitis. rivaroxaban has been held for 3 days. Informed consent was obtained. He was identified in the preop holding area. He received intravenous antibiotics. He was brought to the operating room and general endotracheal anesthesia was given by the anesthesia staff. His abdomen was prepped and draped in sterile fashion. Time out procedure was done. Gallbladder was noted to be palpable in the right upper quadrant. Supra-and local region was infiltrated with quarter percent Marcaine with epinephrine. Supraumbilical incision was made. Subcutaneous tissues were dissected down revealing the anterior fascia. This was divided along the midline.peritoneal cavity was entered under direct vision. 0 Vicryl pursestring suture was placed around the fascial opening. Hassan trocar was inserted. Abdomen was insufflated with carbon dioxide in standard fashion. Under direct vision, a 5 mm epigastric and 2 5 mm right-sided ports were placed. Local was used at each port site. Laparoscopic exploration revealed a tense distended gallbladder with acute inflammation and patchy gangrenous necrosis. It was drained with the nahzat system. Dark bile was returned. Dome was retracted superior  medially. Filmy and dense omental adhesions were gradually carefully spoke down. We got good hemostasis with cautery along the way. This revealed the body and finally, the infundibulum. The infundibulum was grasped and retracted inferior laterally. Dissection began laterally and progressed medially. We were able to visualize the cystic duct as well as and anterior posterior branch of the cystic artery. Dissection continued until critical view was obtained between the liver, the cystic duct, and the gallbladder. The cystic duct was wide in diameter. A clip was placed on the infundibular cystic duct junction. Small nick was made in the cystic duct. Thick bile with small bits of sludge was milked out of the biliary system.Cholangiogram catheter was inserted. This revealed normal biliary anatomy above the ampulla. Due to leakage of contrast because of the large diameter of the cystic duct we could not get a good flow into the duodenum. However, in light of the entirety of the clinical situation, this was deemed acceptable. Cholangiogram catheter was removed. We will check liver function tests postoperatively. Cystic duct was divided. The base was closed with a PDS Endoloop. There was excellent seal. Anterior and posterior branches of the cystic artery were then dissected out, clipped twice proximally and once distally, and divided. Gallbladder was taken off the liver bed with Bovie cautery. We achieved excellent hemostasis along the way. Gallbladder was placed in an Endo Catch bag and removed from the Supraumbilical port site. Liver bed was copiously irrigated. A tedious hemostasis was obtained with cautery. Irrigation was used until returned clear. Liver bed was rechecked. it remained dry and clips remain in good position.  Ports were then removed under direct vision. Pneumoperitoneum was released. Supra-and local fascia was closed by tying the 0 Vicryl pursestring suture and placing an additional figure-of-eight  0  Vicryl suture to get a secure closure. All 4 wounds were copiously irrigated and the skin of each was closed with running 4 Vicryl followed by Dermabond. All counts were correct. Patient tolerated procedure well without apparent complication and was taken recovery in stable condition.  PATIENT DISPOSITION:  PACU - hemodynamically stable.   Delay start of Pharmacological VTE agent (>24hrs) due to surgical blood loss or risk of bleeding:  no  Georganna Skeans, MD, MPH, FACS Pager: [phone]  3/24/201412:48 PM

## 2012-03-24 NOTE — Progress Notes (Signed)
Patient ID: [name] [name]  male  [id number]    DOB: March 24, 1926    DOA: 03/22/2012  PCP: [name], MD  Assessment/Plan: Principal Problem:   Cholecystitis, acute - N.p.o. For laparoscopic cholecystectomy today - Management per [facility] - Xarelto being held  Active Problems:  History of CAD with recent CABG 2 weeks ago.  - Continue beta blocker aspirin, Appreciate cardiology and CTVS followup.  - Hold xarelto for now - Monitor CBC closely   paroxysmal atrial fibrillation. Is in sinus,  - continue beta blocker, ASA 81 mg -  xaralto held for pending surgery   Intermittent right nostril nosebleeds. Has been a chronic problem, nosebleed is stable for now, -  xaralto being held, on ASA 81 mg - use PRN saline spray   Hypertension  - stable on home dose beta blocker   Chronic systolic CHF: -  Echo on [id number] had shown EF of 45-50%  currently compensated, we will monitor closely, cautious with IV fluids or diuretics.   Code Status: Full  DVT Prophylaxis:  Code Status: Full code  Disposition: OR today  Subjective: Seen earlier this morning, was awaiting surgery, complaint of right-sided shoulder pain (discussed with Dr. Wynonia Lawman, happens normally after CABG surgery)  Objective: Weight change:   Intake/Output Summary (Last 24 hours) at 03/24/12 1314 Last data filed at 03/24/12 1258  Gross per 24 hour  Intake 2367.67 ml  Output   1700 ml  Net 667.67 ml   Blood pressure 139/68, pulse 85, temperature 97.3 F (36.3 C), temperature source Oral, resp. rate 18, height 6\' 2"  (1.88 m), weight 105.189 kg (231 lb 14.4 oz), SpO2 97.00%.  Physical Exam: General: Alert and awake, oriented x3, not in any acute distress. CVS: S1-S2 clear, no murmur rubs or gallops Chest: clear to auscultation bilaterally, no wheezing, rales or rhonchi, incision healing well Abdomen: soft RUQ TTP, normal bowel sounds Extremities: no cyanosis, clubbing. 1+ edema noted bilaterally   Lab Results: Basic  Metabolic Panel:  Recent Labs Lab 03/23/12 0555 03/24/12 0532  NA 136 135  K 4.6 4.4  CL 103 102  CO2 20 18*  GLUCOSE 99 90  BUN 13 10  CREATININE 1.14 1.07  CALCIUM 8.4 8.5  MG  --  2.0   Liver Function Tests:  Recent Labs Lab 03/22/12 0645 03/23/12 0555  AST 345* 162*  ALT 196* 194*  ALKPHOS 359* 324*  BILITOT 1.8* 1.9*  PROT 6.6 6.0  ALBUMIN 2.9* 2.8*    Recent Labs Lab 03/22/12 0645  LIPASE 140*   No results found for this basename: AMMONIA,  in the last 168 hours CBC:  Recent Labs Lab 03/23/12 0555 03/24/12 0532  WBC 7.9 6.1  HGB 8.6* 8.4*  HCT 26.5* 25.3*  MCV 91.1 87.8  PLT 329 PLATELET CLUMPS NOTED ON SMEAR, COUNT APPEARS ADEQUATE   Cardiac Enzymes:  Recent Labs Lab 03/22/12 1103  CKTOTAL 35  CKMB 3.3   BNP: No components found with this basename: POCBNP,  CBG: No results found for this basename: GLUCAP,  in the last 168 hours   Micro Results: Recent Results (from the past 240 hour(s))  SURGICAL PCR SCREEN     Status: None   Collection Time    03/24/12 12:54 AM      Result Value Range Status   MRSA, PCR NEGATIVE  NEGATIVE Final   Staphylococcus aureus NEGATIVE  NEGATIVE Final   Comment:            The Xpert SA  Assay (FDA     approved for NASAL specimens     in patients over 77 years of age),     is one component of     a comprehensive surveillance     program.  Test performance has     been validated by [employer] for patients greater     than or equal to 21 year old.     It is not intended     to diagnose infection nor to     guide or monitor treatment.    Studies/Results: Dg Chest 2 View  03/11/2012  *RADIOLOGY REPORT*  Clinical Data: Post CABG, some chest pain at incision site  CHEST - 2 VIEW  Comparison: Portable chest x-ray of 03/09/2012  Findings: Aeration of the lungs is unchanged and there is basilar atelectasis with small effusions left greater than right. Cardiomegaly is stable.  Median sternotomy sutures  are noted with left atrial appendage exclusion device present.  IMPRESSION: Small effusions and mild basilar atelectasis remain.   Original Report Authenticated By: Ivar Drape, M.D.    Dg Chest 2 View  03/06/2012  *RADIOLOGY REPORT*  Clinical Data: Preoperative examination (CABG)  CHEST - 2 VIEW  Comparison: 03/03/2012; 02/29/2012; 04/23/2010  Findings: Grossly unchanged borderline enlarged cardiac silhouette and mediastinal contours.  Atherosclerotic calcifications within the thoracic aorta.  The lungs remain hyperexpanded with flattening of bilateral hemidiaphragms and mild diffuse thickening of the pulmonary interstitium.  Possible trace right-sided pleural effusion.  No definite evidence of edema.  No pneumothorax. Unchanged bones.  IMPRESSION: Hyperexpanded lungs and bronchitic change without acute cardiopulmonary disease.   Original Report Authenticated By: Jake Seats, MD    US Abdomen Complete  03/22/2012  *RADIOLOGY REPORT*  Clinical Data:  Pain, evaluate for possible cholecystitis  COMPLETE ABDOMINAL ULTRASOUND  Comparison:  None.  Findings:  Gallbladder:  There are numerous small calculi and there is sludge all layering dependently within the gallbladder lumen.  Gallbladder wall measures 4 mm in thickness.  There is no sonographic Murphy's sign.  Common bile duct:  Normal at 6 mm  Liver:  No focal lesion identified.  Within normal limits in parenchymal echogenicity.  IVC:  Appears normal.  Pancreas:  No focal abnormality seen.  Spleen:  Normal at 6 cm  Right Kidney:  12 cm in length.  Normal except for 3 cm simple cyst upper pole.  Left Kidney:  Normal, measuring 12 cm  Abdominal aorta:  No aneurysm identified.  IMPRESSION: Cholelithiasis with gallbladder sludge and gallbladder wall thickening.  No sonographic Murphy's sign or biliary dilatation. Although nonspecific, the findings can be seen with cholecystitis. If clinically appropriate, consider hepatic biliary scan.   Original Report  Authenticated By: Skipper Cliche, M.D.    Ct Abdomen Pelvis W Contrast  03/22/2012  *RADIOLOGY REPORT*  Clinical Data: Right upper quadrant pain.  Concern for gallstones or obstruction.  CT ABDOMEN AND PELVIS WITH CONTRAST  Technique:  Multidetector CT imaging of the abdomen and pelvis was performed following the standard protocol during bolus administration of intravenous contrast.  Contrast: 110mL OMNIPAQUE IOHEXOL 300 MG/ML  SOLN  Comparison: CT 07/02/2009, ultrasound 03/22/2012  Findings: There are bilateral pleural effusions and associated basilar atelectasis.  No pericardial fluid.  There is no focal hepatic lesion.  There two gallstones in the gallbladder.  There is mild gallbladder wall thickening. There is pericholecystic fluid seen on the coronal projection.  The gallbladder is mildy distended measuring 3.7 cm.  Common  bile duct is normal caliber.  There is no intrahepatic biliary dilatation. The pancreas, spleen, and adrenal glands are normal.  There is simple cyst within the right kidney.  The stomach, small bowel, appendix, cecum are normal.  Multiple diverticula throughout the colon without evidence acute inflammation.  Abdominal aorta normal caliber.  No retroperitoneal periportal lymphadenopathy.  No free fluid the pelvis.  The prostate gland bladder normal. Small fat filled inguinal hernias are present.  The left and Review of  bone windows demonstrates no aggressive osseous lesions.  IMPRESSION:  1.  Gallbladder wall thickening and pericholecystic fluid are concerning for acute cholecystitis. Gallstones and sludge present on ultrasound.  2.  Extensive colonic diverticulosis without diverticulitis. 3.  Bilateral pleural effusions and passive atelectasis.   Original Report Authenticated By: Suzy Bouchard, M.D.    Dg Chest Port 1 View  03/23/2012  *RADIOLOGY REPORT*  Clinical Data: CHF  PORTABLE CHEST - 1 VIEW  Comparison: 03/11/2012  Findings: Previous CABG.  Stable mild cardiomegaly.  Left  retrocardiac atelectasis/consolidation, slightly increased.  Right lung clear. No definite effusion.  IMPRESSION:  1.  Some interval increase in left retrocardiac atelectasis/consolidation.   Original Report Authenticated By: D. Wallace Going, MD    Dg Chest Port 1 View  03/09/2012  *RADIOLOGY REPORT*  Clinical Data: Status post CABG.  PORTABLE CHEST - 1 VIEW  Comparison: 03/08/2012  Findings: Right IJ Cordis sheath is unchanged in position. Prior median sternotomy.  Midline trachea.  Mild cardiomegaly.  Resolved left-sided pleural effusion.  Mild biapical pleural thickening. No pneumothorax.  No congestive failure.  Improved left base air space disease.  IMPRESSION: Resolved left-sided pleural effusion with improved left base atelectasis.   Original Report Authenticated By: Abigail Miyamoto, M.D.    Dg Chest Port 1 View  03/08/2012  *RADIOLOGY REPORT*  Clinical Data: Status post CABG.  PORTABLE CHEST - 1 VIEW  Comparison: Chest x-ray 03/07/2012.  Findings: Previously noted Swan-Ganz catheter and left-sided chest tube have been removed.  Mediastinal drain has also been removed. Right IJ central venous Cordis remains in position with tip in the proximal superior vena cava.  Status post median sternotomy.  Left atrial appendage ligation clip is noted.  Ostial markers from CABG are also noted.  Lung volumes are low.  Opacity at the base of the left hemithorax is most compatible with resolving postoperative atelectasis and superimposed small to moderate left-sided pleural effusion.  Right lung is clear.  Cardiopericardial silhouette remains mildly enlarged.  Upper mediastinal contours are distorted by patient rotation to the left.  Atherosclerosis in the thoracic aorta.  IMPRESSION: 1.  Postoperative changes of support apparatus, as above. 2.  Persistent opacity in the left base favored to reflect resolving postoperative atelectasis with superimposed small to moderate left pleural effusion. 3.  Atherosclerosis.   Original  Report Authenticated By: Vinnie Langton, M.D.    Dg Chest Portable 1 View In Am  03/07/2012  *RADIOLOGY REPORT*  Clinical Data: Coronary artery bypass graft.  PORTABLE CHEST - 1 VIEW  Comparison: Multiple recent previous exams.  Findings: 0609 hours.  Endotracheal and NG tubes have been removed in the interval.  The right IJ pulmonary artery catheter is again noted in the right main pulmonary artery.  Left chest tube persists without evidence for left-sided pneumothorax.  Midline drain likely related to the mediastinum or pericardium.  The tip of a probable second drain is seen over the lower left heart.  Left atrial appendage occluder device noted.  No overt edema  or focal airspace consolidation.  No substantial pleural effusion.  IMPRESSION: Interval extubation and NG tube removal.  Otherwise stable.   Original Report Authenticated By: Misty Stanley, M.D.    Dg Chest Portable 1 View  03/06/2012  *RADIOLOGY REPORT*  Clinical Data: Post CABG  PORTABLE CHEST - 1 VIEW  Comparison: Portable exam 1451 hours compared to 0500 hours  Findings: Tip of endotracheal tube projects approximately 3.8 cm above carina. Right jugular Swan-Ganz catheter tip projects over right pulmonary artery. Nasogastric tube extends into stomach. Mediastinal drain and left thoracostomy tube noted. Left atrial appendage clip noted. Epicardial pacing wires present.  Enlargement of cardiac silhouette and mediastinum post interval CABG. Calcified tortuous aorta. Scattered atelectasis without infiltrate, pleural effusion or pneumothorax. Bones unremarkable.  IMPRESSION: Postsurgical changes as above.   Original Report Authenticated By: Lavonia Dana, M.D.    Dg Chest Port 1 View  03/03/2012  *RADIOLOGY REPORT*  Clinical Data: Left heart catheterization, possible MI  PORTABLE CHEST - 1 VIEW  Comparison: Portable chest x-ray of 02/29/2012  Findings: No active infiltrate or effusion is seen.  The lungs appears slightly hyperaerated.  Cardiomegaly is  stable.  No skeletal abnormality is noted other than degenerative change of the thoracic spine.  IMPRESSION: Stable chest x-ray.  No active lung disease.   Original Report Authenticated By: Ivar Drape, M.D.    Dg Chest Portable 1 View  02/29/2012  *RADIOLOGY REPORT*  Clinical Data: 79 year old male with chest pain.  Hypertension.  PORTABLE CHEST - 1 VIEW  Comparison: 04/23/2010 and earlier.  Findings: Portable upright AP view 2206 hours.  Lung volumes are stable within normal limits.  Stable cardiac size and mediastinal contours.  Visualized tracheal air column is within normal limits. No pneumothorax, pulmonary edema, pleural effusion or acute pulmonary opacity.  Multiple EKG leads and wires overlie the chest.  IMPRESSION: No acute cardiopulmonary abnormality.   Original Report Authenticated By: Roselyn Reef, M.D.     Medications: Scheduled Meds: . [facility] HOLD] aspirin EC  81 mg Oral Daily  . [facility] HOLD] atenolol  25 mg Oral Daily  . [[facility] HOLD] enoxaparin (LOVENOX) injection  40 mg Subcutaneous Q24H  . [facility] HOLD] galantamine  8 mg Oral Q breakfast      LOS: 2 days   RAI,RIPUDEEP M.D. [facility] 03/24/2012, 1:14 PM Pager: [phone]  If 7PM-7AM, please contact night-coverage [email or web address] Password [email or web address]

## 2012-03-24 NOTE — Progress Notes (Signed)
Subjective:  Still volume overloaded and SOB  Objective:  Vital Signs in the last 24 hours: BP 139/68  Pulse 85  Temp(Src) 98.2 F (36.8 C) (Oral)  Resp 18  Ht 6\' 2"  (1.88 m)  Wt 105.189 kg (231 lb 14.4 oz)  BMI 29.76 kg/m2  SpO2 97%  Physical Exam: Pleasant WM in NAD Lungs:  Reduced breath sounds Cardiac:  Regular rhythm, normal S1 and S2, no S3 Abdomen: mild tenderness Extremities:  2+ edema present  Intake/Output from previous day: 03/23 0701 - 03/24 0700 In: 967.7 [P.O.:75; I.V.:892.7] Out: 1650 [Urine:1650] Weight Filed Weights   03/22/12 1500  Weight: 105.189 kg (231 lb 14.4 oz)    Lab Results: Basic Metabolic Panel:  Recent Labs  03/23/12 0555 03/24/12 0532  NA 136 135  K 4.6 4.4  CL 103 102  CO2 20 18*  GLUCOSE 99 90  BUN 13 10  CREATININE 1.14 1.07    CBC:  Recent Labs  03/23/12 0555 03/24/12 0532  WBC 7.9 6.1  HGB 8.6* 8.4*  HCT 26.5* 25.3*  MCV 91.1 87.8  PLT 329 PLATELET CLUMPS NOTED ON SMEAR, COUNT APPEARS ADEQUATE    BNP    Component Value Date/Time   PROBNP 1089.0* 03/03/2012 0751    PROTIME: Lab Results  Component Value Date   INR 1.25 03/23/2012   INR 1.51* 03/10/2012   INR 1.22 03/09/2012    Telemetry: sinus  Assessment/Plan:  1. Still volume overloaded from surgery 2. Atypical left chest pain 3. History of a fib 4. Recent CABG  Rec:  OK for GB surgery.  I would have a low threshold for transfusion if anemic post op with dyspnea. Hold Xarelto for now.     Kerry Hough  MD [facility]  03/24/2012, 9:49 AM

## 2012-03-24 NOTE — Anesthesia Postprocedure Evaluation (Signed)
  Anesthesia Post-op Note  Patient: [name] [name]  Procedure(s) Performed: Procedure(s): LAPAROSCOPIC CHOLECYSTECTOMY WITH INTRAOPERATIVE CHOLANGIOGRAM (N/A)  Patient Location: PACU  Anesthesia Type:General  Level of Consciousness: awake  Airway and Oxygen Therapy: Patient Spontanous Breathing  Post-op Pain: mild  Post-op Assessment: Post-op Vital signs reviewed  Post-op Vital Signs: Reviewed  Complications: No apparent anesthesia complications

## 2012-03-25 ENCOUNTER — Encounter (HOSPITAL_COMMUNITY): Payer: Self-pay | Admitting: General Surgery

## 2012-03-25 LAB — HEPATIC FUNCTION PANEL
AST: 157 U/L — ABNORMAL HIGH (ref 0–37)
Albumin: 2.7 g/dL — ABNORMAL LOW (ref 3.5–5.2)
Alkaline Phosphatase: 316 U/L — ABNORMAL HIGH (ref 39–117)
Total Bilirubin: 5.6 mg/dL — ABNORMAL HIGH (ref 0.3–1.2)

## 2012-03-25 LAB — CBC
HCT: 27.9 % — ABNORMAL LOW (ref 39.0–52.0)
Hemoglobin: 9.1 g/dL — ABNORMAL LOW (ref 13.0–17.0)
WBC: 6.9 10*3/uL (ref 4.0–10.5)

## 2012-03-25 MED ORDER — FUROSEMIDE 40 MG PO TABS: 40.0000 mg | ORAL_TABLET | Freq: Every day | ORAL | Status: DC

## 2012-03-25 MED ORDER — GALANTAMINE HYDROBROMIDE 4 MG PO TABS
8.0000 mg | ORAL_TABLET | Freq: Every morning | ORAL | Status: DC
  Administered 2012-03-25: 8 mg via ORAL

## 2012-03-25 NOTE — Discharge Summary (Signed)
Physician Discharge Summary  Patient ID: [name] [name] MRN: [id number] DOB/AGE: 09/21/1926 79 y.o.  Admit date: 03/22/2012 Discharge date: 03/25/2012  Admission Diagnoses:  Discharge Diagnoses:  Principal Problem:   Cholecystitis, acute Active Problems:   Hypertension   Atrial fibrillation   Discharged Condition: good  Hospital Course: Patient is two-week status post coronary artery bypass grafting by Dr. Zenia Resides when he developed abdominal pain. He was admitted with acute cholecystitis. His antiplatelet therapy was held. He was treated with intravenous antibiotics. He underwent laparoscopic cholecystectomy with intraoperative cholangiogram on 03/24/2012. This was uncomplicated. He recovered well postoperatively and tolerated advancement of his diet. Hemoglobin was stable. He is discharged home on postoperative day 1. He will resume his antiplatelet therapy upon discharge.  Consults: None  Significant Diagnostic Studies: Labs and radiography  Treatments: surgery: As above  Discharge Exam: Blood pressure 105/58, pulse 67, temperature 98.1 F (36.7 C), temperature source Oral, resp. rate 16, height 6\' 2"  (1.88 m), weight 105.189 kg (231 lb 14.4 oz), SpO2 99.00%. General appearance: alert and cooperative Resp: clear to auscultation bilaterally Cardio: S1, S2 normal GI: soft, nontender, nondistended, all 4 incisions are healing well neurologic: Alert, oriented, moves all extremities  Disposition: 01-Home or Self Care  Discharge Orders   Future Appointments Provider Department Dept Phone   04/07/2012 1:00 PM Rexene Alberts, MD [facility] [phone]   Future Orders Complete By Expires     Diet - low sodium heart healthy  As directed     Discharge instructions  As directed     Comments:      [facility] LAPAROSCOPIC SURGERY: POST OP INSTRUCTIONS Always review your discharge instruction sheet given to you by  the facility where your surgery was performed. IF YOU HAVE DISABILITY OR FAMILY LEAVE FORMS, YOU MUST BRING THEM TO THE OFFICE FOR PROCESSING.   DO NOT GIVE THEM TO YOUR DOCTOR.  A prescription for pain medication may be given to you upon discharge.  Take your pain medication as prescribed, if needed.  If narcotic pain medicine is not needed, then you may take acetaminophen (Tylenol) or ibuprofen (Advil) as needed. Take your usually prescribed medications unless otherwise directed. If you need a refill on your pain medication, please contact your pharmacy.  They will contact our office to request authorization. Prescriptions will not be filled after 5pm or on week-ends. You should follow a light diet the first few days after arrival home, such as soup and crackers, etc.  Be sure to include lots of fluids daily. Most patients will experience some swelling and bruising in the area of the incisions.  Ice packs will help.  Swelling and bruising can take several days to resolve.  It is common to experience some constipation if taking pain medication after surgery.  Increasing fluid intake and taking a stool softener (such as Colace) will usually help or prevent this problem from occurring.  A mild laxative (Milk of Magnesia or Miralax) should be taken according to package instructions if there are no bowel movements after 48 hours. Unless discharge instructions indicate otherwise, you may remove your bandages 24-48 hours after surgery, and you may shower at that time.  You may have steri-strips (small skin tapes) in place directly over the incision.  These strips should be left on the skin for 7-10 days.  If your surgeon used skin glue on the incision, you may shower in 24 hours.  The glue will flake off over the  next 2-3 weeks.  Any sutures or staples will be removed at the office during your follow-up visit. ACTIVITIES:  You may resume regular (light) daily activities beginning the next day-such as daily  self-care, walking, climbing stairs-gradually increasing activities as tolerated.  You may have sexual intercourse when it is comfortable.  Refrain from any heavy lifting or straining until approved by your doctor. You may drive when you are no longer taking prescription pain medication, you can comfortably wear a seatbelt, and you can safely maneuver your car and apply brakes. RETURN TO WORK:  __________________________________________________________ Dennis Bast should see your doctor in the office for a follow-up appointment approximately 2-3 weeks after your surgery.  Make sure that you call for this appointment within a day or two after you arrive home to insure a convenient appointment time. OTHER INSTRUCTIONS: __________________________________________________________________________________________________________________________ __________________________________________________________________________________________________________________________ WHEN TO CALL YOUR DOCTOR: Fever over 101.0 Inability to urinate Continued bleeding from incision. Increased pain, redness, or drainage from the incision. Increasing abdominal pain  The clinic staff is available to answer your questions during regular business hours.  Please don't hesitate to call and ask to speak to one of the nurses for clinical concerns.  If you have a medical emergency, go to the nearest emergency room or call 911.  A surgeon from [facility] is always on call at the hospital. [address] [place], [place], [place]  [address] ? [address] [place], [place], [place]   [address] [phone] ? [phone] ? FAX [phone] Web site: [email or web address]    Increase activity slowly  As directed     Lifting restrictions  As directed     Comments:      do not lift over 10 pounds for 4 weeks        Medication List    TAKE these medications       allopurinol 300 MG tablet  Commonly known as:  ZYLOPRIM   Take 150 mg by mouth Daily.     amiodarone 200 MG tablet  Commonly known as:  PACERONE  Take 1 tablet (200 mg total) by mouth 2 (two) times daily after a meal.     aspirin 81 MG tablet  Take 81 mg by mouth daily.     atenolol 25 MG tablet  Commonly known as:  TENORMIN  Take 1 tablet (25 mg total) by mouth daily.     cholecalciferol 1000 UNITS tablet  Commonly known as:  VITAMIN D  Take 1,000 Units by mouth daily.     CRESTOR 20 MG tablet  Generic drug:  rosuvastatin  Take 20 mg by mouth Daily.     EPIPEN 2-PAK 0.3 mg/0.3 mL Devi  Generic drug:  EPINEPHrine  Inject 0.3 mg into the muscle See admin instructions. For severe allergic reaction     fish oil-omega-3 fatty acids 1000 MG capsule  Take 1 g by mouth daily.     galantamine 8 MG 24 hr capsule  Commonly known as:  RAZADYNE ER  Take 8 mg by mouth Daily.     NITROSTAT 0.4 MG SL tablet  Generic drug:  nitroGLYCERIN  Place 1 tablet under the tongue every 5 (five) minutes x 3 doses as needed for chest pain.     oxyCODONE 5 MG immediate release tablet  Commonly known as:  Oxy IR/ROXICODONE  Take 1-2 tablets (5-10 mg total) by mouth every 4 (four) hours as needed.     PRESCRIPTION MEDICATION  Inject 1 applicator into the muscle every 8 (  eight) weeks. Allergy shots for Bee Venom allergy     psyllium 58.6 % packet  Commonly known as:  METAMUCIL  Take 1 packet by mouth daily.     Rivaroxaban 15 MG Tabs tablet  Commonly known as:  XARELTO  Take 1 tablet (15 mg total) by mouth daily with supper.     vitamin B-12 1000 MCG tablet  Commonly known as:  CYANOCOBALAMIN  Take 500 mcg by mouth daily.     cyanocobalamin 1000 MCG/ML injection  Commonly known as:  (VITAMIN B-12)  Inject 1,000 mcg into the muscle See admin instructions. Every three weeks     ZETIA 10 MG tablet  Generic drug:  ezetimibe  Take 10 mg by mouth Daily.         SignedZenovia Jarred 03/25/2012, 8:39 AM

## 2012-03-25 NOTE — Progress Notes (Signed)
Patient ID: [name] [name]  male  [id number]    DOB: 11-29-1926    DOA: 03/22/2012  PCP: [name], MD  Assessment/Plan: Principal Problem:   Cholecystitis, acute - s/p lap chole, stable for DC, tolerating diet   History of CAD with recent CABG 2 weeks ago.  - Continue beta blocker aspirin, cleared by cardiology - holding crestor and amiodarone until f/u with Dr Wynonia Lawman   paroxysmal atrial fibrillation. NSR  - continue beta blocker, ASA 81 mg   Intermittent right nostril nosebleeds. Has been a chronic problem, nosebleed is stable for now,  Hypertension  - stable on home dose beta blocker   Chronic systolic CHF: -  Echo on [id number] had shown EF of 45-50%  currently compensated  Code Status: Full  DVT Prophylaxis:  Code Status: Full code  Disposition: patient DC'ed by [facility] today  Subjective: At the time of my examination, patient was sitting all dressed up for discharge. AVS done by [facility] Objective: Weight change:   Intake/Output Summary (Last 24 hours) at 03/25/12 1008 Last data filed at 03/25/12 0900  Gross per 24 hour  Intake 2186.19 ml  Output     50 ml  Net 2136.19 ml   Blood pressure 105/58, pulse 67, temperature 98.1 F (36.7 C), temperature source Oral, resp. rate 16, height 6\' 2"  (1.88 m), weight 105.189 kg (231 lb 14.4 oz), SpO2 99.00%.  Physical Exam: General: Alert and awake, oriented x3, CVS: S1-S2 clear, no murmur rubs or gallops Chest: CTAB healing well Abdomen: soft , normal bowel sounds Extremities: no cyanosis, clubbing. 1+edema   Lab Results: Basic Metabolic Panel:  Recent Labs Lab 03/23/12 0555 03/24/12 0532  NA 136 135  K 4.6 4.4  CL 103 102  CO2 20 18*  GLUCOSE 99 90  BUN 13 10  CREATININE 1.14 1.07  CALCIUM 8.4 8.5  MG  --  2.0   Liver Function Tests:  Recent Labs Lab 03/23/12 0555 03/25/12 0540  AST 162* 157*  ALT 194* 153*  ALKPHOS 324* 316*  BILITOT 1.9* 5.6*  PROT 6.0 6.4  ALBUMIN 2.8* 2.7*    Recent Labs Lab  03/22/12 0645  LIPASE 140*   No results found for this basename: AMMONIA,  in the last 168 hours CBC:  Recent Labs Lab 03/24/12 0532 03/25/12 0540  WBC 6.1 6.9  HGB 8.4* 9.1*  HCT 25.3* 27.9*  MCV 87.8 89.1  PLT PLATELET CLUMPS NOTED ON SMEAR, COUNT APPEARS ADEQUATE 348   Cardiac Enzymes:  Recent Labs Lab 03/22/12 1103  CKTOTAL 35  CKMB 3.3   BNP: No components found with this basename: POCBNP,  CBG: No results found for this basename: GLUCAP,  in the last 168 hours   Micro Results: Recent Results (from the past 240 hour(s))  SURGICAL PCR SCREEN     Status: None   Collection Time    03/24/12 12:54 AM      Result Value Range Status   MRSA, PCR NEGATIVE  NEGATIVE Final   Staphylococcus aureus NEGATIVE  NEGATIVE Final   Comment:            The Xpert SA Assay (FDA     approved for NASAL specimens     in patients over 39 years of age),     is one component of     a comprehensive surveillance     program.  Test performance has     been validated by [employer] for patients greater  than or equal to 71 year old.     It is not intended     to diagnose infection nor to     guide or monitor treatment.    Studies/Results: Dg Chest 2 View  03/11/2012  *RADIOLOGY REPORT*  Clinical Data: Post CABG, some chest pain at incision site  CHEST - 2 VIEW  Comparison: Portable chest x-ray of 03/09/2012  Findings: Aeration of the lungs is unchanged and there is basilar atelectasis with small effusions left greater than right. Cardiomegaly is stable.  Median sternotomy sutures are noted with left atrial appendage exclusion device present.  IMPRESSION: Small effusions and mild basilar atelectasis remain.   Original Report Authenticated By: Ivar Drape, M.D.    Dg Chest 2 View  03/06/2012  *RADIOLOGY REPORT*  Clinical Data: Preoperative examination (CABG)  CHEST - 2 VIEW  Comparison: 03/03/2012; 02/29/2012; 04/23/2010  Findings: Grossly unchanged borderline enlarged cardiac  silhouette and mediastinal contours.  Atherosclerotic calcifications within the thoracic aorta.  The lungs remain hyperexpanded with flattening of bilateral hemidiaphragms and mild diffuse thickening of the pulmonary interstitium.  Possible trace right-sided pleural effusion.  No definite evidence of edema.  No pneumothorax. Unchanged bones.  IMPRESSION: Hyperexpanded lungs and bronchitic change without acute cardiopulmonary disease.   Original Report Authenticated By: Jake Seats, MD    US Abdomen Complete  03/22/2012  *RADIOLOGY REPORT*  Clinical Data:  Pain, evaluate for possible cholecystitis  COMPLETE ABDOMINAL ULTRASOUND  Comparison:  None.  Findings:  Gallbladder:  There are numerous small calculi and there is sludge all layering dependently within the gallbladder lumen.  Gallbladder wall measures 4 mm in thickness.  There is no sonographic Murphy's sign.  Common bile duct:  Normal at 6 mm  Liver:  No focal lesion identified.  Within normal limits in parenchymal echogenicity.  IVC:  Appears normal.  Pancreas:  No focal abnormality seen.  Spleen:  Normal at 6 cm  Right Kidney:  12 cm in length.  Normal except for 3 cm simple cyst upper pole.  Left Kidney:  Normal, measuring 12 cm  Abdominal aorta:  No aneurysm identified.  IMPRESSION: Cholelithiasis with gallbladder sludge and gallbladder wall thickening.  No sonographic Murphy's sign or biliary dilatation. Although nonspecific, the findings can be seen with cholecystitis. If clinically appropriate, consider hepatic biliary scan.   Original Report Authenticated By: Skipper Cliche, M.D.    Ct Abdomen Pelvis W Contrast  03/22/2012  *RADIOLOGY REPORT*  Clinical Data: Right upper quadrant pain.  Concern for gallstones or obstruction.  CT ABDOMEN AND PELVIS WITH CONTRAST  Technique:  Multidetector CT imaging of the abdomen and pelvis was performed following the standard protocol during bolus administration of intravenous contrast.  Contrast: 171mL OMNIPAQUE  IOHEXOL 300 MG/ML  SOLN  Comparison: CT 07/02/2009, ultrasound 03/22/2012  Findings: There are bilateral pleural effusions and associated basilar atelectasis.  No pericardial fluid.  There is no focal hepatic lesion.  There two gallstones in the gallbladder.  There is mild gallbladder wall thickening. There is pericholecystic fluid seen on the coronal projection.  The gallbladder is mildy distended measuring 3.7 cm.  Common bile duct is normal caliber.  There is no intrahepatic biliary dilatation. The pancreas, spleen, and adrenal glands are normal.  There is simple cyst within the right kidney.  The stomach, small bowel, appendix, cecum are normal.  Multiple diverticula throughout the colon without evidence acute inflammation.  Abdominal aorta normal caliber.  No retroperitoneal periportal lymphadenopathy.  No free fluid the pelvis.  The prostate gland bladder normal. Small fat filled inguinal hernias are present.  The left and Review of  bone windows demonstrates no aggressive osseous lesions.  IMPRESSION:  1.  Gallbladder wall thickening and pericholecystic fluid are concerning for acute cholecystitis. Gallstones and sludge present on ultrasound.  2.  Extensive colonic diverticulosis without diverticulitis. 3.  Bilateral pleural effusions and passive atelectasis.   Original Report Authenticated By: Suzy Bouchard, M.D.    Dg Chest Port 1 View  03/23/2012  *RADIOLOGY REPORT*  Clinical Data: CHF  PORTABLE CHEST - 1 VIEW  Comparison: 03/11/2012  Findings: Previous CABG.  Stable mild cardiomegaly.  Left retrocardiac atelectasis/consolidation, slightly increased.  Right lung clear. No definite effusion.  IMPRESSION:  1.  Some interval increase in left retrocardiac atelectasis/consolidation.   Original Report Authenticated By: D. Wallace Going, MD    Dg Chest Port 1 View  03/09/2012  *RADIOLOGY REPORT*  Clinical Data: Status post CABG.  PORTABLE CHEST - 1 VIEW  Comparison: 03/08/2012  Findings: Right IJ Cordis  sheath is unchanged in position. Prior median sternotomy.  Midline trachea.  Mild cardiomegaly.  Resolved left-sided pleural effusion.  Mild biapical pleural thickening. No pneumothorax.  No congestive failure.  Improved left base air space disease.  IMPRESSION: Resolved left-sided pleural effusion with improved left base atelectasis.   Original Report Authenticated By: Abigail Miyamoto, M.D.    Dg Chest Port 1 View  03/08/2012  *RADIOLOGY REPORT*  Clinical Data: Status post CABG.  PORTABLE CHEST - 1 VIEW  Comparison: Chest x-ray 03/07/2012.  Findings: Previously noted Swan-Ganz catheter and left-sided chest tube have been removed.  Mediastinal drain has also been removed. Right IJ central venous Cordis remains in position with tip in the proximal superior vena cava.  Status post median sternotomy.  Left atrial appendage ligation clip is noted.  Ostial markers from CABG are also noted.  Lung volumes are low.  Opacity at the base of the left hemithorax is most compatible with resolving postoperative atelectasis and superimposed small to moderate left-sided pleural effusion.  Right lung is clear.  Cardiopericardial silhouette remains mildly enlarged.  Upper mediastinal contours are distorted by patient rotation to the left.  Atherosclerosis in the thoracic aorta.  IMPRESSION: 1.  Postoperative changes of support apparatus, as above. 2.  Persistent opacity in the left base favored to reflect resolving postoperative atelectasis with superimposed small to moderate left pleural effusion. 3.  Atherosclerosis.   Original Report Authenticated By: Vinnie Langton, M.D.    Dg Chest Portable 1 View In Am  03/07/2012  *RADIOLOGY REPORT*  Clinical Data: Coronary artery bypass graft.  PORTABLE CHEST - 1 VIEW  Comparison: Multiple recent previous exams.  Findings: 0609 hours.  Endotracheal and NG tubes have been removed in the interval.  The right IJ pulmonary artery catheter is again noted in the right main pulmonary artery.  Left  chest tube persists without evidence for left-sided pneumothorax.  Midline drain likely related to the mediastinum or pericardium.  The tip of a probable second drain is seen over the lower left heart.  Left atrial appendage occluder device noted.  No overt edema or focal airspace consolidation.  No substantial pleural effusion.  IMPRESSION: Interval extubation and NG tube removal.  Otherwise stable.   Original Report Authenticated By: Misty Stanley, M.D.    Dg Chest Portable 1 View  03/06/2012  *RADIOLOGY REPORT*  Clinical Data: Post CABG  PORTABLE CHEST - 1 VIEW  Comparison: Portable exam 1451 hours compared to 0500 hours  Findings:  Tip of endotracheal tube projects approximately 3.8 cm above carina. Right jugular Swan-Ganz catheter tip projects over right pulmonary artery. Nasogastric tube extends into stomach. Mediastinal drain and left thoracostomy tube noted. Left atrial appendage clip noted. Epicardial pacing wires present.  Enlargement of cardiac silhouette and mediastinum post interval CABG. Calcified tortuous aorta. Scattered atelectasis without infiltrate, pleural effusion or pneumothorax. Bones unremarkable.  IMPRESSION: Postsurgical changes as above.   Original Report Authenticated By: Lavonia Dana, M.D.    Dg Chest Port 1 View  03/03/2012  *RADIOLOGY REPORT*  Clinical Data: Left heart catheterization, possible MI  PORTABLE CHEST - 1 VIEW  Comparison: Portable chest x-ray of 02/29/2012  Findings: No active infiltrate or effusion is seen.  The lungs appears slightly hyperaerated.  Cardiomegaly is stable.  No skeletal abnormality is noted other than degenerative change of the thoracic spine.  IMPRESSION: Stable chest x-ray.  No active lung disease.   Original Report Authenticated By: Ivar Drape, M.D.    Dg Chest Portable 1 View  02/29/2012  *RADIOLOGY REPORT*  Clinical Data: 79 year old male with chest pain.  Hypertension.  PORTABLE CHEST - 1 VIEW  Comparison: 04/23/2010 and earlier.  Findings:  Portable upright AP view 2206 hours.  Lung volumes are stable within normal limits.  Stable cardiac size and mediastinal contours.  Visualized tracheal air column is within normal limits. No pneumothorax, pulmonary edema, pleural effusion or acute pulmonary opacity.  Multiple EKG leads and wires overlie the chest.  IMPRESSION: No acute cardiopulmonary abnormality.   Original Report Authenticated By: Roselyn Reef, M.D.     Medications: Scheduled Meds: . aspirin EC  81 mg Oral Daily  . atenolol  25 mg Oral Daily  . enoxaparin (LOVENOX) injection  40 mg Subcutaneous Q24H  . galantamine  8 mg Oral q morning - 10a      LOS: 3 days   RAI,RIPUDEEP M.D. [facility] 03/25/2012, 10:08 AM Pager: [phone]  If 7PM-7AM, please contact night-coverage [email or web address] Password [email or web address]

## 2012-03-25 NOTE — Progress Notes (Signed)
Discharge instructions reviewed with pt and pt's wife and instructed on where to pick up prescription.  Pt and pt's wife verbalized understanding and had no questions.  Pt discharged in stable condition via wheelchair with wife.  Edward Trevino Lindsay  

## 2012-03-25 NOTE — Progress Notes (Signed)
Subjective:  Tolerated OR well, in NSR at present.  Still some dyspnea and had to sleep in chair last night.  Objective:  Vital Signs in the last 24 hours: BP 105/58  Pulse 67  Temp(Src) 98.1 F (36.7 C) (Oral)  Resp 16  Ht 6\' 2"  (1.88 m)  Wt 105.189 kg (231 lb 14.4 oz)  BMI 29.76 kg/m2  SpO2 99%  Physical Exam: Pleasant WM in NAD Lungs:  Reduced breath sounds Cardiac:  Regular rhythm, normal S1 and S2, no S3 Abdomen: mild tenderness Extremities:  2+ edema present  Intake/Output from previous day: 03/24 0701 - 03/25 0700 In: 1946.2 [P.O.:240; I.V.:1706.2] Out: 50 [Blood:50] Weight Filed Weights   03/22/12 1500  Weight: 105.189 kg (231 lb 14.4 oz)    Lab Results: Basic Metabolic Panel:  Recent Labs  03/23/12 0555 03/24/12 0532  NA 136 135  K 4.6 4.4  CL 103 102  CO2 20 18*  GLUCOSE 99 90  BUN 13 10  CREATININE 1.14 1.07    CBC:  Recent Labs  03/24/12 0532 03/25/12 0540  WBC 6.1 6.9  HGB 8.4* 9.1*  HCT 25.3* 27.9*  MCV 87.8 89.1  PLT PLATELET CLUMPS NOTED ON SMEAR, COUNT APPEARS ADEQUATE 348    BNP    Component Value Date/Time   PROBNP 1089.0* 03/03/2012 0751    PROTIME: Lab Results  Component Value Date   INR 1.25 03/23/2012   INR 1.51* 03/10/2012   INR 1.22 03/09/2012    Telemetry: sinus  Assessment/Plan:  1. Still volume overloaded from surgery 2. History of a fib 3. Recent CABG  Rec:  OK for discharge.  With elevation of liver enzymes, I would hold Crestor and amiodarone until I see him on Friday.  I will also send home on dose of Lasix 40 mg b/o dyspnea and edema.  He is to see me on Friday.   Kerry Hough  MD [facility]  03/25/2012, 8:57 AM

## 2012-04-03 ENCOUNTER — Other Ambulatory Visit: Payer: Self-pay | Admitting: *Deleted

## 2012-04-03 DIAGNOSIS — I2510 Atherosclerotic heart disease of native coronary artery without angina pectoris: Secondary | ICD-10-CM

## 2012-04-07 ENCOUNTER — Ambulatory Visit
Admission: RE | Admit: 2012-04-07 | Discharge: 2012-04-07 | Disposition: A | Discharge status: Home / Self Care | Payer: Medicare Other | Source: Ambulatory Visit | Attending: Thoracic Surgery (Cardiothoracic Vascular Surgery) | Admitting: Thoracic Surgery (Cardiothoracic Vascular Surgery)

## 2012-04-07 ENCOUNTER — Ambulatory Visit (INDEPENDENT_AMBULATORY_CARE_PROVIDER_SITE_OTHER): Payer: Self-pay | Admitting: Thoracic Surgery (Cardiothoracic Vascular Surgery)

## 2012-04-07 ENCOUNTER — Encounter: Payer: Self-pay | Admitting: Thoracic Surgery (Cardiothoracic Vascular Surgery)

## 2012-04-07 VITALS — BP 123/71 | HR 68 | Resp 20 | Ht 74.0 in | Wt 220.0 lb

## 2012-04-07 DIAGNOSIS — Z9889 Other specified postprocedural states: Secondary | ICD-10-CM

## 2012-04-07 DIAGNOSIS — I77810 Thoracic aortic ectasia: Secondary | ICD-10-CM

## 2012-04-07 DIAGNOSIS — I2510 Atherosclerotic heart disease of native coronary artery without angina pectoris: Secondary | ICD-10-CM

## 2012-04-07 DIAGNOSIS — Z951 Presence of aortocoronary bypass graft: Secondary | ICD-10-CM

## 2012-04-07 DIAGNOSIS — Z8679 Personal history of other diseases of the circulatory system: Secondary | ICD-10-CM

## 2012-04-07 NOTE — Patient Instructions (Signed)
The patient should continue to avoid any heavy lifting or strenuous use of arms or shoulders for at least a total of three months from the time of surgery.  The patient may return to driving an automobile as long as they are no longer requiring oral narcotic pain relievers during the daytime.  It would be wise to start driving only short distances during the daylight and gradually increase from there as they feel comfortable.  The patient is encouraged to enroll and participate in the outpatient cardiac rehab program beginning as soon as practical.  

## 2012-04-07 NOTE — Progress Notes (Signed)
[address]            ,[place] [address]          [phone]     CARDIOTHORACIC SURGERY OFFICE NOTE  Referring Provider is Jacolyn Reedy, MD PCP is [name], MD   HPI:  Patient returns to the office for followup status post coronary artery bypass grafting x3 and Maze procedure on 03/06/2012.  His initial postoperative recovery was uncomplicated and he was discharged from the hospital on 03/14/2012. However, the patient was readmitted to the hospital on 03/22/2012 with abdominal pain and found to have acute cholecystitis. He underwent laparoscopic cholecystectomy by Dr. Grandville Silos on 03/24/2012.  He recovered from this uneventfully and has since then continued to do quite well. He has been seen in followup in the office by Dr. Wynonia Lawman and he returns to our office for routine followup today. He states that he has mild soreness across his anterior chest related to his surgery. This primarily bothersome when he tries to lift flat in bed at night and is usually improved with oral ibuprofen. He has no abdominal pain. His appetite is improving. He has been walking every day and he states that he does get a little short of breath with walking but this is gradually improving.  He plans to start the cardiac rehabilitation program next week. His lower extremity edema continues to gradually improve. His recording his weight every day and states that he continues to slowly trickled down towards is normal weight.  He denies any tachypalpitations. Overall he has no complaints at this time.   Current Outpatient Prescriptions  Medication Sig Dispense Refill  . allopurinol (ZYLOPRIM) 300 MG tablet Take 150 mg by mouth Daily.      Marland Kitchen aspirin 81 MG tablet Take 81 mg by mouth daily.      Marland Kitchen atenolol (TENORMIN) 25 MG tablet Take 1 tablet (25 mg total) by mouth daily.  30 tablet  1  . cholecalciferol (VITAMIN D) 1000 UNITS tablet Take 1,000 Units by mouth daily.      .  cyanocobalamin (,VITAMIN B-12,) 1000 MCG/ML injection Inject 1,000 mcg into the muscle See admin instructions. Every three weeks      . EPINEPHrine (EPIPEN 2-PAK) 0.3 mg/0.3 mL DEVI Inject 0.3 mg into the muscle See admin instructions. For severe allergic reaction      . fish oil-omega-3 fatty acids 1000 MG capsule Take 1 g by mouth daily.       . furosemide (LASIX) 40 MG tablet Take 1 tablet (40 mg total) by mouth daily.  30 tablet  12  . galantamine (RAZADYNE ER) 8 MG 24 hr capsule Take 8 mg by mouth Daily.      Marland Kitchen ibuprofen (ADVIL,MOTRIN) 200 MG tablet Take 200 mg by mouth every 6 (six) hours as needed for pain.      Marland Kitchen NITROSTAT 0.4 MG SL tablet Place 1 tablet under the tongue every 5 (five) minutes x 3 doses as needed for chest pain.       Marland Kitchen oxyCODONE (OXY IR/ROXICODONE) 5 MG immediate release tablet Take 1-2 tablets (5-10 mg total) by mouth every 4 (four) hours as needed.  50 tablet  0  . PRESCRIPTION MEDICATION Inject 1 applicator into the muscle every 8 (eight) weeks. Allergy shots for Bee Venom allergy      . psyllium (METAMUCIL) 58.6 % packet Take 1  packet by mouth daily.      . rivaroxaban (XARELTO) 15 MG TABS tablet Take 1 tablet (15 mg total) by mouth daily with supper.  30 tablet  1  . vitamin B-12 (CYANOCOBALAMIN) 1000 MCG tablet Take 500 mcg by mouth daily.      Marland Kitchen ZETIA 10 MG tablet Take 10 mg by mouth Daily.       No current facility-administered medications for this visit.      Physical Exam:   BP 123/71  Pulse 68  Resp 20  Ht 6\' 2"  (1.88 m)  Wt 220 lb (99.791 kg)  BMI 28.23 kg/m2  SpO2 93%  General:  Well-appearing  Chest:   Clear to auscultation with symmetrical breath sounds  CV:   Regular rate and rhythm  Incisions:  Healing nicely, sternum is stable  Abdomen:  Soft and nontender  Extremities:  Warm and well-perfused with moderate lower extremity edema, right greater than left  Diagnostic Tests:  2 channel telemetry rhythm strip demonstrates normal sinus  rhythm   Impression:  The patient is doing fairly well more than one month status post coronary artery bypass grafting and Maze procedure despite the fact that he was readmitted to the hospital for acute cholecystitis and underwent laparoscopic cholecystectomy 10 days ago. He is maintaining sinus rhythm and overall looks remarkably good under the circumstances.    Plan:  I've encouraged patient to continue to increase his physical activity as tolerated with his primary limitation remaining that he brain from heavy lifting or strenuous use of his arms or shoulders for least another 2 months. I think it is reasonable for him to resume driving an automobile. I've encouraged him to get started the cardiac rehabilitation program. He plans to go on a month long trip to [place] in early May. This sounds like a bit much for him to undertake at this time, but overall he seems to be recovering uneventfully. He plans to undergo a stress test later this month by Dr. Wynonia Lawman. We will plan to see him back in 8-10 weeks for routine followup and rhythm check.   Valentina Gu. Roxy Manns, MD 04/07/2012 1:30 PM

## 2012-04-09 ENCOUNTER — Encounter (INDEPENDENT_AMBULATORY_CARE_PROVIDER_SITE_OTHER): Payer: Self-pay | Admitting: General Surgery

## 2012-04-09 ENCOUNTER — Ambulatory Visit (INDEPENDENT_AMBULATORY_CARE_PROVIDER_SITE_OTHER): Payer: Medicare Other | Admitting: General Surgery

## 2012-04-09 VITALS — BP 110/62 | HR 66 | Temp 97.1°F | Resp 18 | Ht 74.0 in | Wt 216.4 lb

## 2012-04-09 DIAGNOSIS — Z9889 Other specified postprocedural states: Secondary | ICD-10-CM

## 2012-04-09 DIAGNOSIS — Z9049 Acquired absence of other specified parts of digestive tract: Secondary | ICD-10-CM | POA: Insufficient documentation

## 2012-04-09 NOTE — Progress Notes (Signed)
Subjective:     Patient ID: [name] [name], male   DOB: 11-Aug-1926, 78 y.o.   MRN: [id number]  HPI Patient developed acute cholecystitis 2 weeks status post coronary artery bypass grafting. He underwent laparoscopic cholecystectomy. He has been doing well. He is home. He is eating and moving his bowels without difficulty. He is not taking any prescription pain medication.  Review of Systems     Objective:   Physical Exam Abdomen soft and nontender. Incisions healing well. No signs of infection.    Assessment:       Doing well status post laparoscopic cholecystectomy    Plan:     From my standpoint he should avoid heavy lifting for another 2 weeks. His sternal precautions extend far beyond that so he will be well covered. He plans to go on a trip to [place] in May. He certainly looks well enough to do that from my standpoint. I will be happy to see him back as needed.

## 2012-04-10 ENCOUNTER — Encounter (HOSPITAL_COMMUNITY)
Admission: RE | Admit: 2012-04-10 | Discharge: 2012-04-10 | Disposition: A | Discharge status: Home / Self Care | Payer: Medicare Other | Source: Ambulatory Visit | Attending: Cardiology | Admitting: Cardiology

## 2012-04-10 ENCOUNTER — Telehealth (HOSPITAL_COMMUNITY): Payer: Self-pay | Admitting: *Deleted

## 2012-04-10 DIAGNOSIS — Z951 Presence of aortocoronary bypass graft: Secondary | ICD-10-CM | POA: Insufficient documentation

## 2012-04-10 DIAGNOSIS — Z5189 Encounter for other specified aftercare: Secondary | ICD-10-CM | POA: Insufficient documentation

## 2012-04-10 NOTE — Progress Notes (Signed)
Cardiac Rehab Medication Review by a Pharmacist  Does the patient  feel that his/her medications are working for him/her?  yes  Has the patient been experiencing any side effects to the medications prescribed?  no  Does the patient measure his/her own blood pressure or blood glucose at home?  yes   Does the patient have any problems obtaining medications due to transportation or finances?   no  Understanding of regimen: excellent Understanding of indications: good Potential of compliance: excellent   [name] [name] 04/10/2012 8:25 AM

## 2012-04-11 ENCOUNTER — Other Ambulatory Visit: Payer: Self-pay | Admitting: Physician Assistant

## 2012-04-11 DIAGNOSIS — R809 Proteinuria, unspecified: Secondary | ICD-10-CM | POA: Insufficient documentation

## 2012-04-11 DIAGNOSIS — R3129 Other microscopic hematuria: Secondary | ICD-10-CM | POA: Insufficient documentation

## 2012-04-14 ENCOUNTER — Encounter (HOSPITAL_COMMUNITY)
Admission: RE | Admit: 2012-04-14 | Discharge: 2012-04-14 | Disposition: A | Discharge status: Home / Self Care | Payer: Medicare Other | Source: Ambulatory Visit | Attending: Cardiology | Admitting: Cardiology

## 2012-04-14 NOTE — Progress Notes (Signed)
Pt started cardiac rehab today.  Pt tolerated light exercise without difficulty.  VSS,telemetry-sinus rhythm, BBB.  Pt asymptomatic with exercise.  Pt unable to recall his medication list for reconciliation.  Pt will bring med list on Wed.   psychosocial and QOL review will be deferred to that time alos.  Pt oriented to exercise equipment and routine.  Understanding verbalized.

## 2012-04-16 ENCOUNTER — Encounter (HOSPITAL_COMMUNITY)
Admission: RE | Admit: 2012-04-16 | Discharge: 2012-04-16 | Disposition: A | Discharge status: Home / Self Care | Payer: Medicare Other | Source: Ambulatory Visit | Attending: Cardiology | Admitting: Cardiology

## 2012-04-16 ENCOUNTER — Encounter (HOSPITAL_COMMUNITY): Payer: Self-pay

## 2012-04-17 ENCOUNTER — Other Ambulatory Visit: Payer: Self-pay | Admitting: Physician Assistant

## 2012-04-18 ENCOUNTER — Encounter (HOSPITAL_COMMUNITY)
Admission: RE | Admit: 2012-04-18 | Discharge: 2012-04-18 | Disposition: A | Discharge status: Home / Self Care | Payer: Medicare Other | Source: Ambulatory Visit | Attending: Cardiology | Admitting: Cardiology

## 2012-04-21 ENCOUNTER — Encounter (HOSPITAL_COMMUNITY)
Admission: RE | Admit: 2012-04-21 | Discharge: 2012-04-21 | Disposition: A | Discharge status: Home / Self Care | Payer: Medicare Other | Source: Ambulatory Visit | Attending: Cardiology | Admitting: Cardiology

## 2012-04-23 ENCOUNTER — Encounter (HOSPITAL_COMMUNITY)
Admission: RE | Admit: 2012-04-23 | Discharge: 2012-04-23 | Disposition: A | Discharge status: Home / Self Care | Payer: Medicare Other | Source: Ambulatory Visit | Attending: Cardiology | Admitting: Cardiology

## 2012-04-23 NOTE — Progress Notes (Signed)
Reviewed home exercise guidelines with patient including endpoints, temperature precautions, target heart rate and rate of perceived exertion. Pt plans to walk as his mode of home exercise. Pt voices understanding of instructions given.  Seward Carol, MS, ACSM CES

## 2012-04-23 NOTE — Progress Notes (Signed)
Reviewed quality of life questionnaire.  Pt has overall good quality of life with positive outlook and good coping skills. No problem areas identified.  Offered emotional support if needed.  Will continue to monitor.

## 2012-04-25 ENCOUNTER — Encounter (HOSPITAL_COMMUNITY): Payer: Medicare Other

## 2012-04-28 ENCOUNTER — Encounter (HOSPITAL_COMMUNITY)
Admission: RE | Admit: 2012-04-28 | Discharge: 2012-04-28 | Disposition: A | Discharge status: Home / Self Care | Payer: Medicare Other | Source: Ambulatory Visit | Attending: Cardiology | Admitting: Cardiology

## 2012-04-28 NOTE — Progress Notes (Signed)
[name] [name] 79 y.o. male Nutrition Note Spoke with pt.  Nutrition Plan and Nutrition Survey goals reviewed with pt. Pt is not following the Therapeutic Lifestyle Changes diet. Age-appropriate dietary recommendations discussed. Per pt, pt wants to lose wt and has not currently been trying to lose wt. Wt loss tips reviewed.  Pt is pre-diabetic according to pt's last A1c. Pre-diabetes discussed. Pt expressed understanding of the information reviewed. Pt aware of nutrition education classes offered and has attended one nutrition class.  Nutrition Diagnosis   Food-and nutrition-related knowledge deficit related to lack of exposure to information as related to diagnosis of: ? CVD ? Pre-DM (A1c 6.1)    Overweight related to excessive energy intake as evidenced by a BMI of 29.3  Nutrition RX/ Estimated Daily Nutrition Needs for: wt loss  1600-2100 Kcal, 40-55 gm fat, 10-14 gm sat fat, 1.5-2.1 gm trans-fat, <1500 mg sodium   Nutrition Intervention   Pt's individual nutrition plan reviewed with pt.   Benefits of adopting Therapeutic Lifestyle Changes discussed when Medficts reviewed.   Pt to attend the Portion Distortion class   Pt to attend the  ? Nutrition I class - met 04/22/12                    ? Nutrition II class      Pt given handouts for: ? Nutrition II class   Continue client-centered nutrition education by RD, as part of interdisciplinary care. Goal(s)   Pt to identify food quantities necessary to achieve: ? wt loss to a goal wt of 193-211 lb (87.8-96.0 kg) at graduation from cardiac rehab.    Pt to describe the benefit of including fruits, vegetables, whole grains, and low-fat dairy products in a heart healthy meal plan. Monitor and Evaluate progress toward nutrition goal with team. Nutrition Risk: Change to Moderate   Derek Mound, M.Ed, RD, LDN, CDE 04/28/2012 2:57 PM

## 2012-04-30 ENCOUNTER — Encounter (HOSPITAL_COMMUNITY)
Admission: RE | Admit: 2012-04-30 | Discharge: 2012-04-30 | Disposition: A | Discharge status: Home / Self Care | Payer: Medicare Other | Source: Ambulatory Visit | Attending: Cardiology | Admitting: Cardiology

## 2012-05-02 ENCOUNTER — Encounter (HOSPITAL_COMMUNITY)
Admission: RE | Admit: 2012-05-02 | Discharge: 2012-05-02 | Disposition: A | Discharge status: Home / Self Care | Payer: Medicare Other | Source: Ambulatory Visit | Attending: Cardiology | Admitting: Cardiology

## 2012-05-02 DIAGNOSIS — Z951 Presence of aortocoronary bypass graft: Secondary | ICD-10-CM | POA: Insufficient documentation

## 2012-05-02 DIAGNOSIS — Z5189 Encounter for other specified aftercare: Secondary | ICD-10-CM | POA: Insufficient documentation

## 2012-05-05 ENCOUNTER — Encounter (HOSPITAL_COMMUNITY): Payer: Self-pay

## 2012-05-05 ENCOUNTER — Encounter (HOSPITAL_COMMUNITY)
Admission: RE | Admit: 2012-05-05 | Discharge: 2012-05-05 | Disposition: A | Discharge status: Home / Self Care | Payer: Medicare Other | Source: Ambulatory Visit | Attending: Cardiology | Admitting: Cardiology

## 2012-05-05 NOTE — Progress Notes (Signed)
Pt graduated from cardiac rehab program today.  Medication list reconciled.  PHQ9 score- zero .  Pt has made moderate lifestyle changes and should be commended for his success, however will need MD encouragement to continue exercising on his own.  Pt plans to continue exercise by walking at home. Pt is leaving country this week for extended vacation and may resume cardiac rehab upon his return home.

## 2012-06-25 ENCOUNTER — Emergency Department (HOSPITAL_COMMUNITY): Payer: Medicare Other

## 2012-06-25 ENCOUNTER — Encounter (HOSPITAL_COMMUNITY): Payer: Self-pay | Admitting: Emergency Medicine

## 2012-06-25 ENCOUNTER — Emergency Department (HOSPITAL_COMMUNITY)
Admission: EM | Admit: 2012-06-25 | Discharge: 2012-06-25 | Disposition: A | Discharge status: Home / Self Care | Payer: Medicare Other | Attending: Emergency Medicine | Admitting: Emergency Medicine

## 2012-06-25 DIAGNOSIS — Z8639 Personal history of other endocrine, nutritional and metabolic disease: Secondary | ICD-10-CM | POA: Insufficient documentation

## 2012-06-25 DIAGNOSIS — I2510 Atherosclerotic heart disease of native coronary artery without angina pectoris: Secondary | ICD-10-CM | POA: Insufficient documentation

## 2012-06-25 DIAGNOSIS — E785 Hyperlipidemia, unspecified: Secondary | ICD-10-CM | POA: Insufficient documentation

## 2012-06-25 DIAGNOSIS — Z87442 Personal history of urinary calculi: Secondary | ICD-10-CM | POA: Insufficient documentation

## 2012-06-25 DIAGNOSIS — I209 Angina pectoris, unspecified: Secondary | ICD-10-CM | POA: Insufficient documentation

## 2012-06-25 DIAGNOSIS — Z87891 Personal history of nicotine dependence: Secondary | ICD-10-CM | POA: Insufficient documentation

## 2012-06-25 DIAGNOSIS — IMO0002 Reserved for concepts with insufficient information to code with codable children: Secondary | ICD-10-CM | POA: Insufficient documentation

## 2012-06-25 DIAGNOSIS — Z862 Personal history of diseases of the blood and blood-forming organs and certain disorders involving the immune mechanism: Secondary | ICD-10-CM | POA: Insufficient documentation

## 2012-06-25 DIAGNOSIS — I10 Essential (primary) hypertension: Secondary | ICD-10-CM | POA: Insufficient documentation

## 2012-06-25 DIAGNOSIS — Z951 Presence of aortocoronary bypass graft: Secondary | ICD-10-CM | POA: Insufficient documentation

## 2012-06-25 DIAGNOSIS — Z79899 Other long term (current) drug therapy: Secondary | ICD-10-CM | POA: Insufficient documentation

## 2012-06-25 DIAGNOSIS — M25519 Pain in unspecified shoulder: Secondary | ICD-10-CM | POA: Insufficient documentation

## 2012-06-25 DIAGNOSIS — Z8679 Personal history of other diseases of the circulatory system: Secondary | ICD-10-CM | POA: Insufficient documentation

## 2012-06-25 DIAGNOSIS — Z7982 Long term (current) use of aspirin: Secondary | ICD-10-CM | POA: Insufficient documentation

## 2012-06-25 DIAGNOSIS — E669 Obesity, unspecified: Secondary | ICD-10-CM | POA: Insufficient documentation

## 2012-06-25 DIAGNOSIS — Z9089 Acquired absence of other organs: Secondary | ICD-10-CM | POA: Insufficient documentation

## 2012-06-25 DIAGNOSIS — Z7901 Long term (current) use of anticoagulants: Secondary | ICD-10-CM | POA: Insufficient documentation

## 2012-06-25 DIAGNOSIS — Z8669 Personal history of other diseases of the nervous system and sense organs: Secondary | ICD-10-CM | POA: Insufficient documentation

## 2012-06-25 DIAGNOSIS — Z87448 Personal history of other diseases of urinary system: Secondary | ICD-10-CM | POA: Insufficient documentation

## 2012-06-25 DIAGNOSIS — I4891 Unspecified atrial fibrillation: Secondary | ICD-10-CM | POA: Insufficient documentation

## 2012-06-25 DIAGNOSIS — R0789 Other chest pain: Secondary | ICD-10-CM

## 2012-06-25 LAB — CBC WITH DIFFERENTIAL/PLATELET
Basophils Relative: 0 % (ref 0–1)
HCT: 36.5 % — ABNORMAL LOW (ref 39.0–52.0)
Hemoglobin: 11.7 g/dL — ABNORMAL LOW (ref 13.0–17.0)
Lymphocytes Relative: 15 % (ref 12–46)
MCHC: 32.1 g/dL (ref 30.0–36.0)
Monocytes Absolute: 0.9 10*3/uL (ref 0.1–1.0)
Monocytes Relative: 10 % (ref 3–12)
Neutro Abs: 6.2 10*3/uL (ref 1.7–7.7)

## 2012-06-25 LAB — PRO B NATRIURETIC PEPTIDE: Pro B Natriuretic peptide (BNP): 431.9 pg/mL (ref 0–450)

## 2012-06-25 LAB — BASIC METABOLIC PANEL
CO2: 25 mEq/L (ref 19–32)
Chloride: 100 mEq/L (ref 96–112)
Potassium: 4.1 mEq/L (ref 3.5–5.1)
Sodium: 135 mEq/L (ref 135–145)

## 2012-06-25 LAB — TROPONIN I: Troponin I: <0.3 ng/mL (ref <0.30)

## 2012-06-25 MED ORDER — NITROGLYCERIN 0.4 MG SL SUBL
0.4000 mg | SUBLINGUAL_TABLET | SUBLINGUAL | Status: DC | PRN
  Administered 2012-06-25 (×2): 0.4 mg via SUBLINGUAL
  Filled 2012-06-25: qty 25

## 2012-06-25 MED ORDER — IOHEXOL 350 MG/ML SOLN
100.0000 mL | Freq: Once | INTRAVENOUS | Status: AC | PRN
  Administered 2012-06-25: 100 mL via INTRAVENOUS

## 2012-06-25 NOTE — ED Notes (Signed)
Lab at bedside

## 2012-06-25 NOTE — ED Notes (Signed)
Pt ambulated to restroom with no issues.  

## 2012-06-25 NOTE — ED Notes (Signed)
Pt placed back on monitor, continuous pulse oximetry and blood pressure cuff; family at bedside 

## 2012-06-25 NOTE — ED Notes (Signed)
Pt c/o left sided shoulder pain and CP starting this am; pt denies SOB

## 2012-06-25 NOTE — ED Notes (Signed)
Called phlebotomy to come get the rest of the patient's bld work.

## 2012-06-25 NOTE — ED Provider Notes (Signed)
3:25 PM seen by me complained of left shoulder pain radiating to left neck and left arm and left chest started at 1 AM today. Symptoms now almost gone. Treated with nitroglycerin while here, with minimal relief however upon arrival here pain was severe. On exam no distress lungs clear auscultation heart regular in rhythm abdomen nondistended nontender Results for orders placed during the hospital encounter of 06/25/12  CBC WITH DIFFERENTIAL      Result Value Range   WBC 8.5  4.0 - 10.5 K/uL   RBC 4.39  4.22 - 5.81 MIL/uL   Hemoglobin 11.7 (*) 13.0 - 17.0 g/dL   HCT 36.5 (*) 39.0 - 52.0 %   MCV 83.1  78.0 - 100.0 fL   MCH 26.7  26.0 - 34.0 pg   MCHC 32.1  30.0 - 36.0 g/dL   RDW 17.2 (*) 11.5 - 15.5 %   Platelets 200  150 - 400 K/uL   Neutrophils Relative % 73  43 - 77 %   Neutro Abs 6.2  1.7 - 7.7 K/uL   Lymphocytes Relative 15  12 - 46 %   Lymphs Abs 1.3  0.7 - 4.0 K/uL   Monocytes Relative 10  3 - 12 %   Monocytes Absolute 0.9  0.1 - 1.0 K/uL   Eosinophils Relative 2  0 - 5 %   Eosinophils Absolute 0.1  0.0 - 0.7 K/uL   Basophils Relative 0  0 - 1 %   Basophils Absolute 0.0  0.0 - 0.1 K/uL  PROTIME-INR      Result Value Range   Prothrombin Time 13.6  11.6 - 15.2 seconds   INR 1.06  0.00 - 1.49  TROPONIN I      Result Value Range   Troponin I <0.30  <0.30 ng/mL  BASIC METABOLIC PANEL      Result Value Range   Sodium 135  135 - 145 mEq/L   Potassium 4.1  3.5 - 5.1 mEq/L   Chloride 100  96 - 112 mEq/L   CO2 25  19 - 32 mEq/L   Glucose, Bld 92  70 - 99 mg/dL   BUN 23  6 - 23 mg/dL   Creatinine, Ser 1.08  0.50 - 1.35 mg/dL   Calcium 9.3  8.4 - 10.5 mg/dL   GFR calc non Af Amer 61 (*) >90 mL/min   GFR calc Af Amer 71 (*) >90 mL/min  PRO B NATRIURETIC PEPTIDE      Result Value Range   Pro B Natriuretic peptide (BNP) 431.9  0 - 450 pg/mL   Date: 06/25/2012  Rate: 75  Rhythm: normal sinus rhythm  QRS Axis: left  Intervals: normal  ST/T Wave abnormalities: nonspecific T wave  changes  Conduction Disutrbances:right bundle branch block  Narrative Interpretation:   Old EKG Reviewed: PVCs from tracing from March 22 14 have resolved, otherwise unchanged interpreted by me  Dg Chest 2 View  06/25/2012   *RADIOLOGY REPORT*  Clinical Data: Chest pain  CHEST - 2 VIEW  Comparison: 04/07/2012  Findings: The lungs are clear without focal infiltrate, edema,  or pneumothorax.  Tiny left pleural effusion. The cardiopericardial silhouette is within normal limits for size.  The patient is status post CABG.  Left atrial occluder device is evident. Imaged bony structures of the thorax are intact.  IMPRESSION: Tiny left pleural effusion has decreased in the interval.   Original Report Authenticated By: Misty Stanley, M.D.   3:50 PM Spoke with Dr.Tilley. This patient  is well-known to him. Patient has had typical angina in the past. He knows his anginal type pain well. In light of negative markers nonacute EKG pain is not likely cardiac in etiology. Dr. Wynonia Lawman feels patient is okay to go home with outpatient followup. Patient agrees with this prior to patient discharge we will obtain CT angiogram of chest to rule out  aortic dissection Results for orders placed during the hospital encounter of 06/25/12  CBC WITH DIFFERENTIAL      Result Value Range   WBC 8.5  4.0 - 10.5 K/uL   RBC 4.39  4.22 - 5.81 MIL/uL   Hemoglobin 11.7 (*) 13.0 - 17.0 g/dL   HCT 36.5 (*) 39.0 - 52.0 %   MCV 83.1  78.0 - 100.0 fL   MCH 26.7  26.0 - 34.0 pg   MCHC 32.1  30.0 - 36.0 g/dL   RDW 17.2 (*) 11.5 - 15.5 %   Platelets 200  150 - 400 K/uL   Neutrophils Relative % 73  43 - 77 %   Neutro Abs 6.2  1.7 - 7.7 K/uL   Lymphocytes Relative 15  12 - 46 %   Lymphs Abs 1.3  0.7 - 4.0 K/uL   Monocytes Relative 10  3 - 12 %   Monocytes Absolute 0.9  0.1 - 1.0 K/uL   Eosinophils Relative 2  0 - 5 %   Eosinophils Absolute 0.1  0.0 - 0.7 K/uL   Basophils Relative 0  0 - 1 %   Basophils Absolute 0.0  0.0 - 0.1 K/uL   PROTIME-INR      Result Value Range   Prothrombin Time 13.6  11.6 - 15.2 seconds   INR 1.06  0.00 - 1.49  TROPONIN I      Result Value Range   Troponin I <0.30  <0.30 ng/mL  BASIC METABOLIC PANEL      Result Value Range   Sodium 135  135 - 145 mEq/L   Potassium 4.1  3.5 - 5.1 mEq/L   Chloride 100  96 - 112 mEq/L   CO2 25  19 - 32 mEq/L   Glucose, Bld 92  70 - 99 mg/dL   BUN 23  6 - 23 mg/dL   Creatinine, Ser 1.08  0.50 - 1.35 mg/dL   Calcium 9.3  8.4 - 10.5 mg/dL   GFR calc non Af Amer 61 (*) >90 mL/min   GFR calc Af Amer 71 (*) >90 mL/min  PRO B NATRIURETIC PEPTIDE      Result Value Range   Pro B Natriuretic peptide (BNP) 431.9  0 - 450 pg/mL   Dg Chest 2 View  06/25/2012   *RADIOLOGY REPORT*  Clinical Data: Chest pain  CHEST - 2 VIEW  Comparison: 04/07/2012  Findings: The lungs are clear without focal infiltrate, edema,  or pneumothorax.  Tiny left pleural effusion. The cardiopericardial silhouette is within normal limits for size.  The patient is status post CABG.  Left atrial occluder device is evident. Imaged bony structures of the thorax are intact.  IMPRESSION: Tiny left pleural effusion has decreased in the interval.   Original Report Authenticated By: Misty Stanley, M.D.   Ct Angio Chest Aorta W/cm &/or Wo/cm  06/25/2012   *RADIOLOGY REPORT*  Clinical Data: Chest and neck pain, radiating to the shoulder, history of coronary bypass  CT ANGIOGRAPHY CHEST  Technique:  Multidetector CT imaging of the chest using the standard protocol during bolus administration of intravenous contrast. Multiplanar reconstructed  images including MIPs were obtained and reviewed to evaluate the vascular anatomy.  Contrast: 166mL OMNIPAQUE IOHEXOL 350 MG/ML SOLN  Comparison: 03/22/2012 CT abdomen pelvis  Findings: Atherosclerotic calcifications of the thoracic aorta without evidence of dissection noted.  Major branch vessels appear patent.  Mild luminal narrowing of the left subclavian origin, less than  50%.  Slight ectasia of the ascending thoracic aorta, maximal AP diameter 4.3 cm, image 84.  No mediastinal hemorrhage or hematoma.  No pericardial effusion.  Prior coronary bypass changes noted.  Visualized pulmonary arteries are patent.  No significant pulmonary embolus by CTA.  No adenopathy.  No significant hiatal hernia.  Small dependent residual left effusion.  Previous right effusion has resolved.  Lung windows demonstrate scattered peripheral sub segmental atelectasis.  Residual bibasilar atelectasis, worse in the left lower lobe and lingula.  No suspicious airspace process, pneumonia, interstitial change, or pneumothorax.  Trachea and central airways appear patent.  Included upper abdomen demonstrates prior cholecystectomy.  Stable left upper pole renal cyst measures 26 mm.  No acute upper abdominal finding.  Diffuse thoracic degenerative changes spondylosis.  IMPRESSION: Ectatic atherosclerotic thoracic aorta without acute dissection.  Negative for acute pulmonary embolus  Small residual left pleural effusion.  Resolved right effusion.  Bibasilar atelectasis worse in the right lower lobe  Prior coronary bypass changes   Original Report Authenticated By: Jerilynn Mages. Shick, M.D.  620 pm pt resting xcomfortably feels ready to go home.  MDM: close out pt f/u with Dr.Tilley. Pain felt to be atypical for ACS given recent CABG atypical sxm neg ekg and marker  Orlie Dakin, MD 06/25/12 [id number]

## 2012-06-25 NOTE — ED Notes (Signed)
Spoke with Main Lab, results of bld work should result in next 5 minutes.

## 2012-06-25 NOTE — ED Notes (Signed)
Pt c/o left shoulder pain that radiates in to left neck and left lateral chest, woke him up from his sleep at about 3am. Pt reports he had a CABG in march of this year and then a few weeks later had his gallbladder removed. Pt had been flying a lot lately, has been back from his trip a week. Pt denies SOB. Pt in nad, skin warm and dry, resp e/u.

## 2012-06-25 NOTE — ED Provider Notes (Signed)
History    CSN: [id number] Arrival date & time 06/25/12  1124  First MD Initiated Contact with Patient 06/25/12 1150     Chief Complaint  Patient presents with  . Chest Pain  . Shoulder Pain   (Consider location/radiation/quality/duration/timing/severity/associated sxs/prior Treatment) HPI 79 year old male presents to emergency room with complaint of left chest and shoulder pain.  Pain or woke him this morning around 3 AM.  Initially, just in the shoulder, it has since spread to his left chest and left neck and jaw.  He denies any nausea, diaphoresis or shortness of breath, associated with it.  Patient reports he is status post CABG in March of this year, followed by a gallbladder removal.  Patient recently returned from a trip to [place].  He denies any leg swelling, no dyspnea.  He is on xarelto, denies missing any doses of his medication.  Patient is followed by Dr. Wynonia Lawman for cardiology, and Dr. Joylene Draft for primary care.  Past Medical History  Diagnosis Date  . Hypertension   . Angina   . Coronary artery disease   . CAD (coronary artery disease)     Cath April 2012 40% left main, 50% LAD, 40% OM, occluded RCA with L-R collaterals   . Hypertension   . BPH (benign prostatic hypertrophy)   . Chronic venous insufficiency   . Gout   . Hyperlipidemia   . Obesity (BMI 30-39.9)   . Glaucoma   . Gout   . Acute coronary syndrome 02/29/2012  . Atrial fibrillation 02/29/2012    Recurrent paroxysmal, new-onset  . Acute systolic heart failure 0000000  . S/P CABG x 3 03/06/2012    LIMA to LAD, SVG to OM, SVG to RCA, EVH via right thigh  . S/P Maze operation for atrial fibrillation 03/06/2012    Complete bilateral lesions set using bipolar radiofrequency and cryothermy ablation with clipping of LA appendage  . Ascending aorta dilatation 03/13/2012    Fusiform dilatation of the ascending thoracic aorta discovered during surgery, max diameter 4.2-4.3 cm  . Kidney stones    Past Surgical  History  Procedure Laterality Date  . Cardiac catheterization    . Prostatectomy    . Cataract extraction, bilateral    . Coronary artery bypass graft N/A 03/06/2012    Procedure: CORONARY ARTERY BYPASS GRAFTING (CABG);  Surgeon: Rexene Alberts, MD;  Location: [facility];  Service: Open Heart Surgery;  Laterality: N/A;  . Maze N/A 03/06/2012    Procedure: MAZE;  Surgeon: Rexene Alberts, MD;  Location: [facility];  Service: Open Heart Surgery;  Laterality: N/A;  . Intraoperative transesophageal echocardiogram N/A 03/06/2012    Procedure: INTRAOPERATIVE TRANSESOPHAGEAL ECHOCARDIOGRAM;  Surgeon: Rexene Alberts, MD;  Location: [place];  Service: Open Heart Surgery;  Laterality: N/A;  . Endovein harvest of greater saphenous vein Right 03/06/2012    Procedure: ENDOVEIN HARVEST OF GREATER SAPHENOUS VEIN;  Surgeon: Rexene Alberts, MD;  Location: [facility];  Service: Open Heart Surgery;  Laterality: Right;  . Cholecystectomy N/A 03/24/2012    Procedure: LAPAROSCOPIC CHOLECYSTECTOMY WITH INTRAOPERATIVE CHOLANGIOGRAM;  Surgeon: Zenovia Jarred, MD;  Location: [facility];  Service: General;  Laterality: N/A;   Family History  Problem Relation Age of Onset  . Dementia Mother   . Stroke Brother    History  Substance Use Topics  . Smoking status: Former Research scientist (life sciences)  . Smokeless tobacco: Never Used  . Alcohol Use: 0.6 oz/week    1 Shots of liquor per week  Comment: weekly    Review of Systems  All other systems reviewed and are negative.   other than listed in history of present illness  Allergies  Bee venom  Home Medications   Current Outpatient Rx  Name  Route  Sig  Dispense  Refill  . allopurinol (ZYLOPRIM) 300 MG tablet   Oral   Take 150 mg by mouth Daily.         Marland Kitchen aspirin 81 MG tablet   Oral   Take 81 mg by mouth daily.         Marland Kitchen atenolol (TENORMIN) 25 MG tablet   Oral   Take 12.5 mg by mouth daily.         . cholecalciferol (VITAMIN D) 1000 UNITS tablet   Oral   Take 1,000 Units by mouth  daily.         . DiphenhydrAMINE HCl (BENADRYL ALLERGY PO)   Oral   Take 25 mg by mouth as needed.          Marland Kitchen EPINEPHrine (EPIPEN 2-PAK) 0.3 mg/0.3 mL DEVI   Intramuscular   Inject 0.3 mg into the muscle See admin instructions. For severe allergic reaction         . fish oil-omega-3 fatty acids 1000 MG capsule   Oral   Take 1 g by mouth daily.          . furosemide (LASIX) 40 MG tablet   Oral   Take 1 tablet (40 mg total) by mouth daily.   30 tablet   12   . galantamine (RAZADYNE ER) 8 MG 24 hr capsule   Oral   Take 8 mg by mouth Daily.         Marland Kitchen ibuprofen (ADVIL,MOTRIN) 200 MG tablet   Oral   Take 200 mg by mouth every 6 (six) hours as needed for pain.         . methocarbamol (ROBAXIN) 500 MG tablet   Oral   Take 500 mg by mouth as needed (pain).         Marland Kitchen NITROSTAT 0.4 MG SL tablet   Sublingual   Place 1 tablet under the tongue every 5 (five) minutes x 3 doses as needed for chest pain.          Marland Kitchen PRESCRIPTION MEDICATION   Intramuscular   Inject 1 applicator into the muscle every 8 (eight) weeks. Allergy shots for Bee Venom allergy         . psyllium (METAMUCIL) 58.6 % packet   Oral   Take 1 packet by mouth daily.         . rivaroxaban (XARELTO) 15 MG TABS tablet   Oral   Take 1 tablet (15 mg total) by mouth daily with supper.   30 tablet   1   . rosuvastatin (CRESTOR) 20 MG tablet   Oral   Take 10 mg by mouth daily.         Marland Kitchen triamcinolone cream (KENALOG) 0.1 %   Topical   Apply 1 application topically as needed (dry skin irritation).          . vitamin B-12 (CYANOCOBALAMIN) 1000 MCG tablet   Oral   Take 1,000 mcg by mouth daily.          Marland Kitchen ZETIA 10 MG tablet   Oral   Take 10 mg by mouth Daily.          BP 134/66  Pulse 70  Temp(Src) 97.8 F (36.6 C) (  Oral)  Resp 20  SpO2 100% Physical Exam  Nursing note and vitals reviewed. Constitutional: He is oriented to person, place, and time. He appears well-developed and  well-nourished.  HENT:  Head: Normocephalic and atraumatic.  Nose: Nose normal.  Mouth/Throat: Oropharynx is clear and moist.  Eyes: Conjunctivae and EOM are normal. Pupils are equal, round, and reactive to light.  Neck: Normal range of motion. Neck supple. No JVD present. No tracheal deviation present. No thyromegaly present.  Cardiovascular: Normal rate, regular rhythm, normal heart sounds and intact distal pulses.  Exam reveals no gallop and no friction rub.   No murmur heard. Pulmonary/Chest: Effort normal and breath sounds normal. No stridor. No respiratory distress. He has no wheezes. He has no rales. He exhibits no tenderness.  Abdominal: Soft. Bowel sounds are normal. He exhibits no distension and no mass. There is no tenderness. There is no rebound and no guarding.  Musculoskeletal: Normal range of motion. He exhibits no edema and no tenderness.  Lymphadenopathy:    He has no cervical adenopathy.  Neurological: He is alert and oriented to person, place, and time. He exhibits normal muscle tone. Coordination normal.  Skin: Skin is warm and dry. No rash noted. No erythema. No pallor.  Psychiatric: He has a normal mood and affect. His behavior is normal. Judgment and thought content normal.    ED Course  Procedures (including critical care time) Labs Reviewed  CBC WITH DIFFERENTIAL - Abnormal; Notable for the following:    Hemoglobin 11.7 (*)    HCT 36.5 (*)    RDW 17.2 (*)    All other components within normal limits  BASIC METABOLIC PANEL - Abnormal; Notable for the following:    GFR calc non Af Amer 61 (*)    GFR calc Af Amer 71 (*)    All other components within normal limits  PROTIME-INR  TROPONIN I  PRO B NATRIURETIC PEPTIDE    Date: 06/25/2012  Rate: 74  Rhythm: normal sinus rhythm  QRS Axis: normal  Intervals: normal  ST/T Wave abnormalities: normal  Conduction Disutrbances:right bundle branch block  Narrative Interpretation: bigeminy has resolved from prior   Old EKG Reviewed: changes noted    Dg Chest 2 View  06/25/2012   *RADIOLOGY REPORT*  Clinical Data: Chest pain  CHEST - 2 VIEW  Comparison: 04/07/2012  Findings: The lungs are clear without focal infiltrate, edema,  or pneumothorax.  Tiny left pleural effusion. The cardiopericardial silhouette is within normal limits for size.  The patient is status post CABG.  Left atrial occluder device is evident. Imaged bony structures of the thorax are intact.  IMPRESSION: Tiny left pleural effusion has decreased in the interval.   Original Report Authenticated By: Misty Stanley, M.D.   Ct Angio Chest Aorta W/cm &/or Wo/cm  06/25/2012   *RADIOLOGY REPORT*  Clinical Data: Chest and neck pain, radiating to the shoulder, history of coronary bypass  CT ANGIOGRAPHY CHEST  Technique:  Multidetector CT imaging of the chest using the standard protocol during bolus administration of intravenous contrast. Multiplanar reconstructed images including MIPs were obtained and reviewed to evaluate the vascular anatomy.  Contrast: 177mL OMNIPAQUE IOHEXOL 350 MG/ML SOLN  Comparison: 03/22/2012 CT abdomen pelvis  Findings: Atherosclerotic calcifications of the thoracic aorta without evidence of dissection noted.  Major branch vessels appear patent.  Mild luminal narrowing of the left subclavian origin, less than 50%.  Slight ectasia of the ascending thoracic aorta, maximal AP diameter 4.3 cm, image 84.  No mediastinal  hemorrhage or hematoma.  No pericardial effusion.  Prior coronary bypass changes noted.  Visualized pulmonary arteries are patent.  No significant pulmonary embolus by CTA.  No adenopathy.  No significant hiatal hernia.  Small dependent residual left effusion.  Previous right effusion has resolved.  Lung windows demonstrate scattered peripheral sub segmental atelectasis.  Residual bibasilar atelectasis, worse in the left lower lobe and lingula.  No suspicious airspace process, pneumonia, interstitial change, or pneumothorax.   Trachea and central airways appear patent.  Included upper abdomen demonstrates prior cholecystectomy.  Stable left upper pole renal cyst measures 26 mm.  No acute upper abdominal finding.  Diffuse thoracic degenerative changes spondylosis.  IMPRESSION: Ectatic atherosclerotic thoracic aorta without acute dissection.  Negative for acute pulmonary embolus  Small residual left pleural effusion.  Resolved right effusion.  Bibasilar atelectasis worse in the right lower lobe  Prior coronary bypass changes   Original Report Authenticated By: Jerilynn Mages. Annamaria Boots, M.D.   No diagnosis found.  MDM  79 year old male with new onset left shoulder, chest and jaw pain.  Patient has history of coronary disease, status post CABG in March.  Patient also with recent travel, and long flights.  Symptoms do not seem consistent with a PE, and patient is currently taking xarelto.  We'll get lab work, chest x-ray, and we'll discuss with his cardiologist, for further intervention and workup.  Care discussed with DR Cathleen Fears, awaiting lab results and cardiology consult.  Kalman Drape, MD 06/26/12 [phone]

## 2012-06-25 NOTE — ED Notes (Signed)
Called phlebotomy for redraw.

## 2012-06-25 NOTE — ED Notes (Signed)
Protime INR and BMP was drawn by me at 1256, labeled and sent to lab for testing

## 2012-06-26 ENCOUNTER — Encounter: Payer: Self-pay | Admitting: Cardiology

## 2012-06-26 NOTE — Progress Notes (Signed)
Patient ID: [name] [name], male   DOB: 1926-12-23, 79 y.o.   MRN: [id number]   Knighton, Geyman I  Date of visit:  06/26/2012 DOB:  1926/03/12    Age:  79 yrs. Medical record number:  [id number]     Account number:  [id number] Primary Care Provider: Joylene Draft, MARK A ____________________________ CURRENT DIAGNOSES  1. Arrhythmia-Atrial Fibrillation  2. Angina Pectoris  3. CAD,Native  4. Hyperlipidemia  5. Obesity(BMI30-40)  6. Hypertension-Essential (Benign)  7. Chronic venous insufficiency  8. Edema  9. Surgery-Aortocoronary Bypass Grafting  10. Angina- unstable ____________________________ ALLERGIES  Bee stings ____________________________ MEDICATIONS  1. nitroglycerin 0.4 mg tablet, sublingual, PRN  2. Vitamin B-12 500 mcg tablet, 1 p.o. daily  3. Fish Oil 1,000 mg Capsule, 1 p.o. daily  4. Vitamin D 400 unit Tablet, 1 p.o. daily  5. Zetia 10 mg Tablet, 1 p.o. daily  6. atenolol 50 mg Tablet, 1/2 tab daily  7. galantamine 8 mg Tablet, 1 p.o. daily  8. allopurinol 300 mg tablet, 1/2 tab daily  9. furosemide 40 mg tablet, 1 p.o. daily  10. oxycodone 5 mg tablet, PRN  11. Xarelto 15 mg tablet, 1 p.o. daily  12. aspirin 81 mg tablet,chewable, 1 p.o. daily ____________________________ CHIEF COMPLAINTS  Recent er ____________________________ HISTORY OF PRESENT ILLNESS  Patient seen following emergency room visit last evening. The patient awoke around 3 AM the night before last with left shoulder pain. He describes fairly severe left shoulder pain that then radiated into his left chest area and somewhat into his neck. Appear different than his previous angina. Since he was last here he was able to take a trip to [place] and had a long plane flight and had been doing well with that. He has been on Xarelto. I had an extensive workup with an EKG was nondiagnostic and eventually had a CT angiogram that showed no evidence of pulmonary emboli. His shoulder is feeling better now. He was able to tolerate  his trip to [place] fairly well and did not have any angina while he was there and was able to walk long distances. He does complain however that his legs appear heavy and weak at times. He has no PND, orthopnea or claudication. ____________________________ PAST HISTORY  Past Medical Illnesses:  hypertension, hyperlipidemia, gout, obesity, BPH, chronic venous insufficiency;  Cardiovascular Illnesses:  CAD;  Surgical Procedures:  cholecystectomy (lap), CABG;  Cardiology Procedures-Invasive:  cardiac cath (left) March 2013, cardiac cath (left) March 2014, CABG with LIMA to LAD, SVG to OM, SVG to RCA, Maze with amputation of atrial appendage 3/14 Dr. Roxy Manns;  Cardiology Procedures-Noninvasive:  treadmill cardiolite June 2007, echocardiogram April 2012, echocardiogram January 2013, echocardiogram January 2013;  Cardiac Cath Results:  calcified proximal Left main, 60% stenosis proximal Left main, 95% stenosis LAD, segmental LAD, 40% stenosis OM 1, occluded proximal RCA, left to right collateral;  LVEF of 60% documented via cardiac cath on 02/29/2012,   ____________________________ CARDIO-PULMONARY TEST DATES EKG Date:  03/28/2012;   Cardiac Cath Date:  02/29/2012;  CABG: 03/06/2012;  Nuclear Study Date:  06/27/2005;  Echocardiography Date: 03/10/2012;  Chest Xray Date: 03/01/2012;   ____________________________ FAMILY HISTORY Brother -- CVA, Hypercholesterolemia, Deceased Father -- Blood disorder, Deceased Mother -- Alzheimer's disease, Deceased ____________________________ SOCIAL HISTORY Alcohol Use:  socially;  Smoking:  used to smoke but quit Prior to 1980;  Diet:  regular diet;  Lifestyle:  remarried;  Exercise:  exercises regularly;  Occupation:  retired and [employer];  Residence:  lives with wife;  ____________________________ REVIEW OF SYSTEMS General:  malaise and fatigue Respiratory: mild dyspnea with exertion Cardiovascular:  please review HPI  Genitourinary-Male: incontinence, nocturia   Musculoskeletal:  denies arthritis, venous insufficiency, or muscle weakness. Neurological:  mild memory loss Psychiatric:  insomnia  ____________________________ PHYSICAL EXAMINATION VITAL SIGNS  Blood Pressure:  104/60 Sitting, Left arm, regular cuff   Pulse:  76/min. Weight:  225.00 lbs. Height:  72"BMI: 30  Constitutional:  pleasant white male in no acute distress, mildly obese Skin:  warm and dry to touch, no apparent skin lesions, or masses noted. Head:  normocephalic, balding male hair pattern Neck:  supple, no masses, thyromegaly, JVD. Carotid pulses are full and equal bilaterally without bruits. Chest:  normal symmetry, clear to auscultation and percussion., healed median sternotomy scar Cardiac:  regular rhythm, normal S1 and S2, No S3 or S4, no murmurs, gallops or rubs detected. Peripheral Pulses:  the femoral,dorsalis pedis, and posterior tibial pulses are full and equal bilaterally with no bruits auscultated. Extremities & Back:  bilateral venous insufficiency changes present, normal muscle strength and tone., 1+ edema, 2+ edema right leg, well healed saphenous vein donor site RLE Neurological:  no gross motor or sensory deficits noted, affect appropriate, oriented x3. ____________________________ MOST RECENT LIPID PANEL 04/04/12  CHOL TOTL 184 mg/dl, LDL 123 calc, HDL 37 mg/dl, TRIGLYCER 122 mg/dl and CHOL/HDL 5.0 (Calc) ____________________________ IMPRESSIONS/PLAN  1. Left shoulder pain that is atypical for angina and has resolved 2. Recent coronary artery bypass grafting 3. History of atrial fibrillation recently in sinus rhythm 4. Long-term anticoagulation with Xarelto  Recommendations:  He is clinically better today. I reviewed the CT angiogram and we actually were able to see that the 2 vein grafts and the mammary graft appeared patent the best we can tell on the images. I will see him in 3 months. He was concerned about the leg fatigue and I told him that it was  still fairly early in his progress and that this may take some time to resolve. He could have had a cervical disc or local shoulder problem that caused the shoulder pain. ____________________________ TODAYS ORDERS  1. Return Visit: 3 months  2. 12 Lead EKG: 3 months                       ____________________________ Cardiology Physician:  Kerry Hough MD [facility]

## 2012-06-30 ENCOUNTER — Ambulatory Visit (INDEPENDENT_AMBULATORY_CARE_PROVIDER_SITE_OTHER): Payer: Medicare Other | Admitting: Thoracic Surgery (Cardiothoracic Vascular Surgery)

## 2012-06-30 ENCOUNTER — Encounter: Payer: Self-pay | Admitting: Thoracic Surgery (Cardiothoracic Vascular Surgery)

## 2012-06-30 VITALS — BP 110/61 | HR 71 | Resp 18 | Ht 74.0 in | Wt 228.0 lb

## 2012-06-30 DIAGNOSIS — Z951 Presence of aortocoronary bypass graft: Secondary | ICD-10-CM

## 2012-06-30 DIAGNOSIS — Z8679 Personal history of other diseases of the circulatory system: Secondary | ICD-10-CM

## 2012-06-30 DIAGNOSIS — Z9889 Other specified postprocedural states: Secondary | ICD-10-CM

## 2012-06-30 DIAGNOSIS — I77810 Thoracic aortic ectasia: Secondary | ICD-10-CM

## 2012-06-30 NOTE — Progress Notes (Signed)
[address]       [place] [address]             [phone]     CARDIOTHORACIC SURGERY OFFICE NOTE  Referring Provider is Jacolyn Reedy, MD PCP is [name], MD   HPI:  Patient returns to the office for followup status post coronary artery bypass grafting x3 and Maze procedure on 03/06/2012.  He was last seen here in the office 04/07/2012. Since then he has done well clinically and in fact he has been a fair amount of time traveling in [place]. However, last week he had an episode of atypical pain in his left shoulder which radiated up to his left neck for which he was evaluated in the emergency department and seen in followup the next day by Dr. Wynonia Lawman. His pain was felt to be not likely to be angina. He underwent CT angiogram of the chest which revealed stable mild enlargement of the ascending thoracic aorta with no sign of aortic dissection and no evidence for pulmonary embolus. No other significant abnormalities were noted. The pain resolved spontaneously while the patient was in the emergency department. Since then he has continued to do well. His biggest complaint at this point is that he gets fatigued with ambulation. He states that both calf muscles seem to feel like leadafter he walks an extended period of time. These symptoms resolve spontaneously with rest. He has not been having shortness of breath. He has not had any chest discomfort reminiscent of angina pectoris he suffered prior to his presentation last spring. He denies any palpitations or dizzy spells.    Current Outpatient Prescriptions  Medication Sig Dispense Refill  . allopurinol (ZYLOPRIM) 300 MG tablet Take 150 mg by mouth Daily.      Marland Kitchen aspirin 81 MG tablet Take 81 mg by mouth daily.      Marland Kitchen atenolol (TENORMIN) 25 MG tablet Take 25 mg by mouth daily.       . cholecalciferol (VITAMIN D) 1000 UNITS tablet Take 1,000 Units by mouth daily.      . DiphenhydrAMINE HCl (BENADRYL ALLERGY PO)  Take 25 mg by mouth as needed.       Marland Kitchen EPINEPHrine (EPIPEN 2-PAK) 0.3 mg/0.3 mL DEVI Inject 0.3 mg into the muscle See admin instructions. For severe allergic reaction      . fish oil-omega-3 fatty acids 1000 MG capsule Take 1 g by mouth daily.       . furosemide (LASIX) 40 MG tablet Take 1 tablet (40 mg total) by mouth daily.  30 tablet  12  . galantamine (RAZADYNE ER) 8 MG 24 hr capsule Take 8 mg by mouth Daily.      Marland Kitchen ibuprofen (ADVIL,MOTRIN) 200 MG tablet Take 200 mg by mouth every 6 (six) hours as needed for pain.      . methocarbamol (ROBAXIN) 500 MG tablet Take 500 mg by mouth as needed (pain).      Marland Kitchen NITROSTAT 0.4 MG SL tablet Place 1 tablet under the tongue every 5 (five) minutes x 3 doses as needed for chest pain.       Marland Kitchen PRESCRIPTION MEDICATION Inject 1 applicator into the muscle every 8 (eight) weeks. Allergy shots for Bee Venom allergy      . psyllium (METAMUCIL) 58.6 % packet Take 1 packet by mouth daily.      . rivaroxaban (XARELTO) 15 MG TABS tablet Take 1 tablet (15 mg total) by mouth daily  with supper.  30 tablet  1  . rosuvastatin (CRESTOR) 20 MG tablet Take 10 mg by mouth daily.      Marland Kitchen triamcinolone cream (KENALOG) 0.1 % Apply 1 application topically as needed (dry skin irritation).       . vitamin B-12 (CYANOCOBALAMIN) 1000 MCG tablet Take 1,000 mcg by mouth daily.       Marland Kitchen ZETIA 10 MG tablet Take 10 mg by mouth Daily.       No current facility-administered medications for this visit.      Physical Exam:   BP 110/61  Pulse 71  Resp 18  Ht 6\' 2"  (1.88 m)  Wt 228 lb (103.42 kg)  BMI 29.26 kg/m2  SpO2 97%  General:  Well-appearing  Chest:   Clear to auscultation  CV:   Regular rate and rhythm without murmur  Incisions:  Completely healed, sternum is stable  Abdomen:  Soft nontender  Extremities:  Warm and well-perfused  Diagnostic Tests:  2 channel telemetry rhythm strip demonstrates normal sinus rhythm   Impression:  Patient is doing well nearly 4 months  following coronary artery bypass grafting and Maze procedure. He is maintaining sinus rhythm. He did have a brief episode of pain in his left shoulder last week of unclear etiology significance. CT scan performed at that time demonstrates stable radiographic appearance of the mildly enlarged ascending thoracic aorta with maximum transverse diameter 4.2-4.3 cm.  Plan:  We will plan to see the patient back for routine followup and rhythm check in 6-8 months. I think it would be wise to recheck the size of his ascending thoracic aorta at that time with another followup CT angiogram.   Valentina Gu. Roxy Manns, MD 06/30/2012 11:03 AM

## 2012-08-28 ENCOUNTER — Other Ambulatory Visit: Payer: Self-pay | Admitting: *Deleted

## 2012-08-28 DIAGNOSIS — I712 Thoracic aortic aneurysm, without rupture, unspecified: Secondary | ICD-10-CM

## 2012-09-24 ENCOUNTER — Other Ambulatory Visit: Payer: Self-pay | Admitting: *Deleted

## 2012-09-26 ENCOUNTER — Ambulatory Visit
Admission: RE | Admit: 2012-09-26 | Discharge: 2012-09-26 | Disposition: A | Discharge status: Home / Self Care | Payer: Medicare Other | Source: Ambulatory Visit | Attending: Thoracic Surgery (Cardiothoracic Vascular Surgery) | Admitting: Thoracic Surgery (Cardiothoracic Vascular Surgery)

## 2012-09-26 DIAGNOSIS — I712 Thoracic aortic aneurysm, without rupture, unspecified: Secondary | ICD-10-CM

## 2012-09-29 ENCOUNTER — Encounter: Payer: Self-pay | Admitting: Thoracic Surgery (Cardiothoracic Vascular Surgery)

## 2012-09-29 ENCOUNTER — Ambulatory Visit (INDEPENDENT_AMBULATORY_CARE_PROVIDER_SITE_OTHER): Payer: Medicare Other | Admitting: Thoracic Surgery (Cardiothoracic Vascular Surgery)

## 2012-09-29 VITALS — BP 114/69 | HR 72 | Resp 20 | Ht 74.0 in | Wt 238.0 lb

## 2012-09-29 DIAGNOSIS — I7121 Aneurysm of the ascending aorta, without rupture: Secondary | ICD-10-CM | POA: Insufficient documentation

## 2012-09-29 DIAGNOSIS — I712 Thoracic aortic aneurysm, without rupture, unspecified: Secondary | ICD-10-CM

## 2012-09-29 DIAGNOSIS — Z8679 Personal history of other diseases of the circulatory system: Secondary | ICD-10-CM

## 2012-09-29 DIAGNOSIS — Z951 Presence of aortocoronary bypass graft: Secondary | ICD-10-CM

## 2012-09-29 DIAGNOSIS — I2510 Atherosclerotic heart disease of native coronary artery without angina pectoris: Secondary | ICD-10-CM

## 2012-09-29 DIAGNOSIS — Z9889 Other specified postprocedural states: Secondary | ICD-10-CM

## 2012-09-29 NOTE — Progress Notes (Signed)
[address]       [place] [address]             [phone]     CARDIOTHORACIC SURGERY OFFICE NOTE  Referring Provider is Jacolyn Reedy, MD PCP is [name], MD   HPI:  Patient returns to the office for followup status post coronary artery bypass grafting x3 and Maze procedure on 03/06/2012 with additional history of mild aneurysmal enlargement of the ascending thoracic aorta. He was last seen here in the office 06/30/2012.  Since then the patient has continued to do quite well. He reports good exercise tolerance. He denies any exertional shortness of breath or chest discomfort. He has not had any tachypalpitations. He remains chronically anticoagulated using Xarelto and he has not had any associated problems or complications.    Current Outpatient Prescriptions  Medication Sig Dispense Refill  . allopurinol (ZYLOPRIM) 300 MG tablet Take 150 mg by mouth Daily.      Marland Kitchen aspirin 81 MG tablet Take 81 mg by mouth daily.      Marland Kitchen atenolol (TENORMIN) 25 MG tablet Take 25 mg by mouth daily.       . cholecalciferol (VITAMIN D) 1000 UNITS tablet Take 1,000 Units by mouth daily.      . DiphenhydrAMINE HCl (BENADRYL ALLERGY PO) Take 25 mg by mouth as needed.       Marland Kitchen EPINEPHrine (EPIPEN 2-PAK) 0.3 mg/0.3 mL DEVI Inject 0.3 mg into the muscle See admin instructions. For severe allergic reaction      . fish oil-omega-3 fatty acids 1000 MG capsule Take 1 g by mouth daily.       . furosemide (LASIX) 40 MG tablet Take 1 tablet (40 mg total) by mouth daily.  30 tablet  12  . galantamine (RAZADYNE ER) 8 MG 24 hr capsule Take 8 mg by mouth Daily.      Marland Kitchen NITROSTAT 0.4 MG SL tablet Place 1 tablet under the tongue every 5 (five) minutes x 3 doses as needed for chest pain.       Marland Kitchen PRESCRIPTION MEDICATION Inject 1 applicator into the muscle every 8 (eight) weeks. Allergy shots for Bee Venom allergy      . psyllium (METAMUCIL) 58.6 % packet Take 1 packet by mouth daily.      .  rivaroxaban (XARELTO) 15 MG TABS tablet Take 1 tablet (15 mg total) by mouth daily with supper.  30 tablet  1  . rosuvastatin (CRESTOR) 20 MG tablet Take 10 mg by mouth daily.      Marland Kitchen triamcinolone cream (KENALOG) 0.1 % Apply 1 application topically as needed (dry skin irritation).       . vitamin B-12 (CYANOCOBALAMIN) 1000 MCG tablet Take 1,000 mcg by mouth daily.       Marland Kitchen ZETIA 10 MG tablet Take 10 mg by mouth Daily.       No current facility-administered medications for this visit.      Physical Exam:   BP 114/69  Pulse 72  Resp 20  Ht 6\' 2"  (1.88 m)  Wt 238 lb (107.956 kg)  BMI 30.54 kg/m2  SpO2 98%  General:  Well-appearing  Chest:   Clear to auscultation  CV:   Regular rate and rhythm without murmur  Incisions:  Completely healed, sternum is stable  Abdomen:  Soft nontender  Extremities:  Warm and well-perfused  Diagnostic Tests:  2 channel telemetry rhythm strip demonstrates normal sinus rhythm   CT CHEST  WITHOUT CONTRAST  TECHNIQUE:  Multidetector CT imaging of the chest was performed following the  standard protocol without IV contrast.  COMPARISON: 06/25/2012 and  FINDINGS:  Utilizing similar landmark to the prior examination, coronal image  (series 601, image 57) demonstrates maximal transverse dimension of  the ascending thoracic aorta of 4.5 cm versus prior exam when this  measured 4.3 cm.  No hyperdense crescent sign to suggest impending rupture. Contrast  was not administered.  Atherosclerotic type changes throughout remainder of the thoracic  aorta are and involving the great vessels.  At the thoracic -abdominal aortic junction, there is a left lateral  wall thickening with displacement of the calcification by 7 mm  (series 3, image 59). On the prior contrast-enhanced examination,  small focal contained dissection/ ulceration at this level is noted  (with similar displacement of wall calcification).  Post CABG with marked coronary artery calcifications.  Heart size  top-normal. No pericardial effusion.  No worrisome lung mass. The apical pleural scarring.  Visualized upper abdominal structures unremarkable.  No bony destructive lesion. Mild thoracic kyphosis and degenerative  changes.  IMPRESSION:  Dilated ascending thoracic aorta measures 4.5 cm versus prior 4.3  cm.  No hyperdense crescent sign to suggest impending rupture. Contrast  was not administered.  Atherosclerotic type changes throughout remainder of the thoracic  aorta are and involving the great vessels.  At the thoracic -abdominal aortic junction, there is a left lateral  wall thickening with displacement of the calcification by 7 mm  (series 3, image 59). On the prior contrast-enhanced examination,  small focal contained dissection/ ulceration at this level is noted  (with similar displacement of wall calcification).  Post CABG with marked coronary artery calcifications.  Electronically Signed  By: Chauncey Cruel  On: 09/26/2012 12:36   Impression:  Patient is doing well clinically 6 months following coronary artery bypass grafting and Maze procedure. He is maintaining sinus rhythm. Followup noncontrast CT scan of the chest demonstrates essentially stable radiographic appearance of the mild aneurysmal enlargement of the ascending thoracic aorta. I have personally reviewed the scan and compared it with the last previous scan.  If there has been any change over the past few months it changes extremely subtle.  Plan:  We will have the patient return in 6 months for routine followup and rhythm check. We will plan followup CT angiogram of the thoracic aorta in one year.   Valentina Gu. Roxy Manns, MD 09/29/2012 12:42 PM

## 2012-10-10 DIAGNOSIS — R233 Spontaneous ecchymoses: Secondary | ICD-10-CM | POA: Insufficient documentation

## 2012-10-20 ENCOUNTER — Ambulatory Visit: Payer: Medicare Other | Admitting: Thoracic Surgery (Cardiothoracic Vascular Surgery)

## 2013-02-23 ENCOUNTER — Ambulatory Visit: Payer: Medicare Other | Admitting: Thoracic Surgery (Cardiothoracic Vascular Surgery)

## 2013-03-30 ENCOUNTER — Ambulatory Visit (INDEPENDENT_AMBULATORY_CARE_PROVIDER_SITE_OTHER): Payer: Medicare Other | Admitting: Thoracic Surgery (Cardiothoracic Vascular Surgery)

## 2013-03-30 VITALS — BP 119/68 | HR 62 | Resp 16 | Ht 74.0 in | Wt 213.0 lb

## 2013-03-30 DIAGNOSIS — I7121 Aneurysm of the ascending aorta, without rupture: Secondary | ICD-10-CM

## 2013-03-30 DIAGNOSIS — Z9889 Other specified postprocedural states: Secondary | ICD-10-CM

## 2013-03-30 DIAGNOSIS — I2510 Atherosclerotic heart disease of native coronary artery without angina pectoris: Secondary | ICD-10-CM

## 2013-03-30 DIAGNOSIS — I77810 Thoracic aortic ectasia: Secondary | ICD-10-CM

## 2013-03-30 DIAGNOSIS — Z8679 Personal history of other diseases of the circulatory system: Secondary | ICD-10-CM

## 2013-03-30 DIAGNOSIS — I712 Thoracic aortic aneurysm, without rupture, unspecified: Secondary | ICD-10-CM

## 2013-03-30 DIAGNOSIS — Z951 Presence of aortocoronary bypass graft: Secondary | ICD-10-CM

## 2013-03-30 NOTE — Patient Instructions (Signed)
Contact Dr Wynonia Lawman to schedule followup appointment as soon as practical if he develop any further symptoms of exertional shortness of breath and/or recurrent chest discomfort.

## 2013-03-30 NOTE — Progress Notes (Signed)
[address]       [place] [address]             [phone]     CARDIOTHORACIC SURGERY OFFICE NOTE  Referring Provider is Jacolyn Reedy, MD PCP is [name], MD   HPI:  Patient returns to the office for followup and rhythm check status post coronary artery bypass grafting x3 and Maze procedure on 03/06/2012 with additional history of mild aneurysmal enlargement of the ascending thoracic aorta. He was last seen here in the office 09/29/2012.  Over the past 6 months he has not developed any significant new problems or complaints. However, he does report some exertional shortness of breath that seems slightly worse than it was 6 months ago. He states that he does not get short of breath with ordinary activities and it only occurs with relatively strenuous activities. However, he thinks his exercise tolerance is not quite as good as it was last summer. He also experienced a single episode of substernal chest tightness approximately 3 weeks ago. This occurred when he was driving an automobile and lasted between 15 and 20 minutes. He took a sublingual nitroglycerin in the symptoms resolve. He has not had any other episodes of chest discomfort or chest pressure. He denies any tachypalpitations or dizzy spells. The remainder of his review of systems unremarkable and the remainder of his past medical history is unchanged.    Current Outpatient Prescriptions  Medication Sig Dispense Refill  . allopurinol (ZYLOPRIM) 300 MG tablet Take 150 mg by mouth Daily.      Marland Kitchen aspirin 81 MG tablet Take 81 mg by mouth daily.      Marland Kitchen atenolol (TENORMIN) 25 MG tablet Take 25 mg by mouth daily.       . cholecalciferol (VITAMIN D) 1000 UNITS tablet Take 1,000 Units by mouth daily.      . DiphenhydrAMINE HCl (BENADRYL ALLERGY PO) Take 25 mg by mouth as needed.       Marland Kitchen EPINEPHrine (EPIPEN 2-PAK) 0.3 mg/0.3 mL DEVI Inject 0.3 mg into the muscle See admin instructions. For severe allergic  reaction      . fish oil-omega-3 fatty acids 1000 MG capsule Take 1 g by mouth daily.       . furosemide (LASIX) 40 MG tablet Take 20 mg by mouth daily.      Marland Kitchen galantamine (RAZADYNE ER) 8 MG 24 hr capsule Take 8 mg by mouth Daily.      Marland Kitchen NITROSTAT 0.4 MG SL tablet Place 1 tablet under the tongue every 5 (five) minutes x 3 doses as needed for chest pain.       Marland Kitchen PRESCRIPTION MEDICATION Inject 1 applicator into the muscle every 8 (eight) weeks. Allergy shots for Bee Venom allergy      . psyllium (METAMUCIL) 58.6 % packet Take 1 packet by mouth daily.      . rivaroxaban (XARELTO) 15 MG TABS tablet Take 1 tablet (15 mg total) by mouth daily with supper.  30 tablet  1  . rosuvastatin (CRESTOR) 20 MG tablet Take 10 mg by mouth daily.      Marland Kitchen triamcinolone cream (KENALOG) 0.1 % Apply 1 application topically as needed (dry skin irritation).       . vitamin B-12 (CYANOCOBALAMIN) 1000 MCG tablet Take 1,000 mcg by mouth daily.       Marland Kitchen ZETIA 10 MG tablet Take 10 mg by mouth Daily.       No  current facility-administered medications for this visit.      Physical Exam:   BP 119/68  Pulse 62  Resp 16  Ht 6\' 2"  (1.88 m)  Wt 213 lb (96.616 kg)  BMI 27.34 kg/m2  SpO2 97%  General:  Well-appearing  Chest:   Clear to auscultation  CV:   Regular rate and rhythm without murmur  Incisions:  Completely healed, sternum is stable  Abdomen:  soft  Extremities:  warm  Diagnostic Tests:  2 channel telemetry rhythm strip demonstrates normal sinus rhythm   Impression:  The patient remains stable approximate one-year status post coronary artery bypass grafting and Maze procedure. He is maintaining sinus rhythm. He is chronically anticoagulated using Xarelto.  He does report some stable symptoms of exertional shortness of breath consistent with NYHA functional class II symptoms of chronic diastolic congestive heart failure. He thinks this may be slightly worse than it was 6 months ago. He initially experienced  a single transient episode of substernal chest discomfort that was relieved with sublingual nitroglycerin.  Plan:  I've encouraged patient to contact Dr. Wynonia Lawman to schedule followup appointment in the near future if he develops any further progression of symptoms of exertional shortness of breath and/or any recurrent episodes of substernal chest discomfort. We have not made any changes in his current medications today. We will plan for the patient to return for routine followup and rhythm check in one year, at which time we'll obtain CT scan of the chest to followup his mild aneurysmal enlargement of the ascending thoracic aorta.   Valentina Gu. Roxy Manns, MD 03/30/2013 12:49 PM

## 2013-06-23 ENCOUNTER — Ambulatory Visit
Admission: RE | Admit: 2013-06-23 | Discharge: 2013-06-23 | Disposition: A | Discharge status: Home / Self Care | Payer: Medicare Other | Source: Ambulatory Visit | Attending: Cardiology | Admitting: Cardiology

## 2013-06-23 ENCOUNTER — Other Ambulatory Visit: Payer: Self-pay | Admitting: Cardiology

## 2013-06-23 DIAGNOSIS — R0989 Other specified symptoms and signs involving the circulatory and respiratory systems: Secondary | ICD-10-CM

## 2013-06-23 DIAGNOSIS — R0609 Other forms of dyspnea: Secondary | ICD-10-CM

## 2013-07-31 DIAGNOSIS — K068 Other specified disorders of gingiva and edentulous alveolar ridge: Secondary | ICD-10-CM | POA: Insufficient documentation

## 2013-07-31 DIAGNOSIS — R195 Other fecal abnormalities: Secondary | ICD-10-CM | POA: Insufficient documentation

## 2013-09-03 IMAGING — CR DG CHEST 2V
2 series · 2 of 2 positions shown · non-contrast
Comparison: 03/23/2012

CLINICAL DATA: Chest pain, shortness of breath, status post CABG

CHEST - 2 VIEW

[w chest pa]
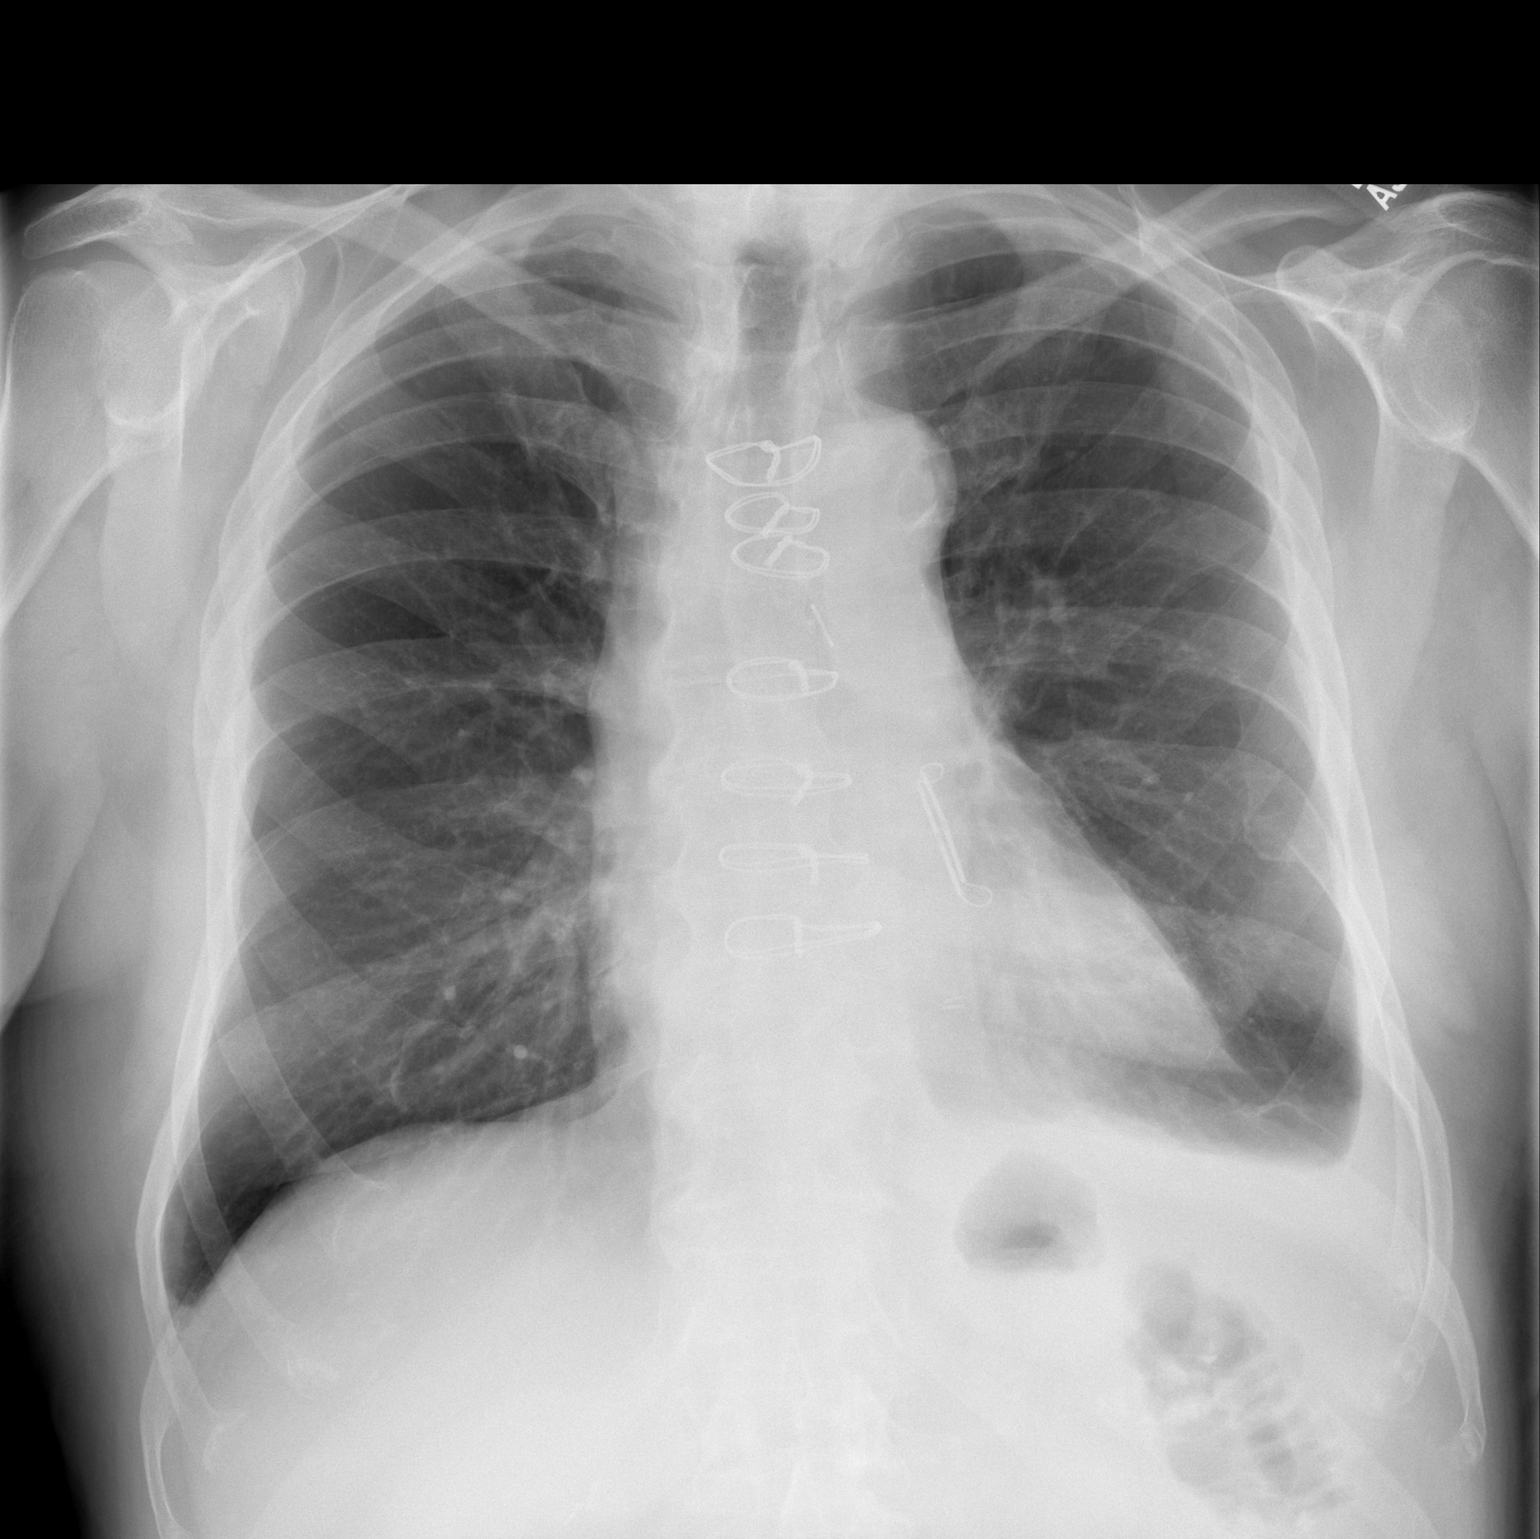

[w chest lat]
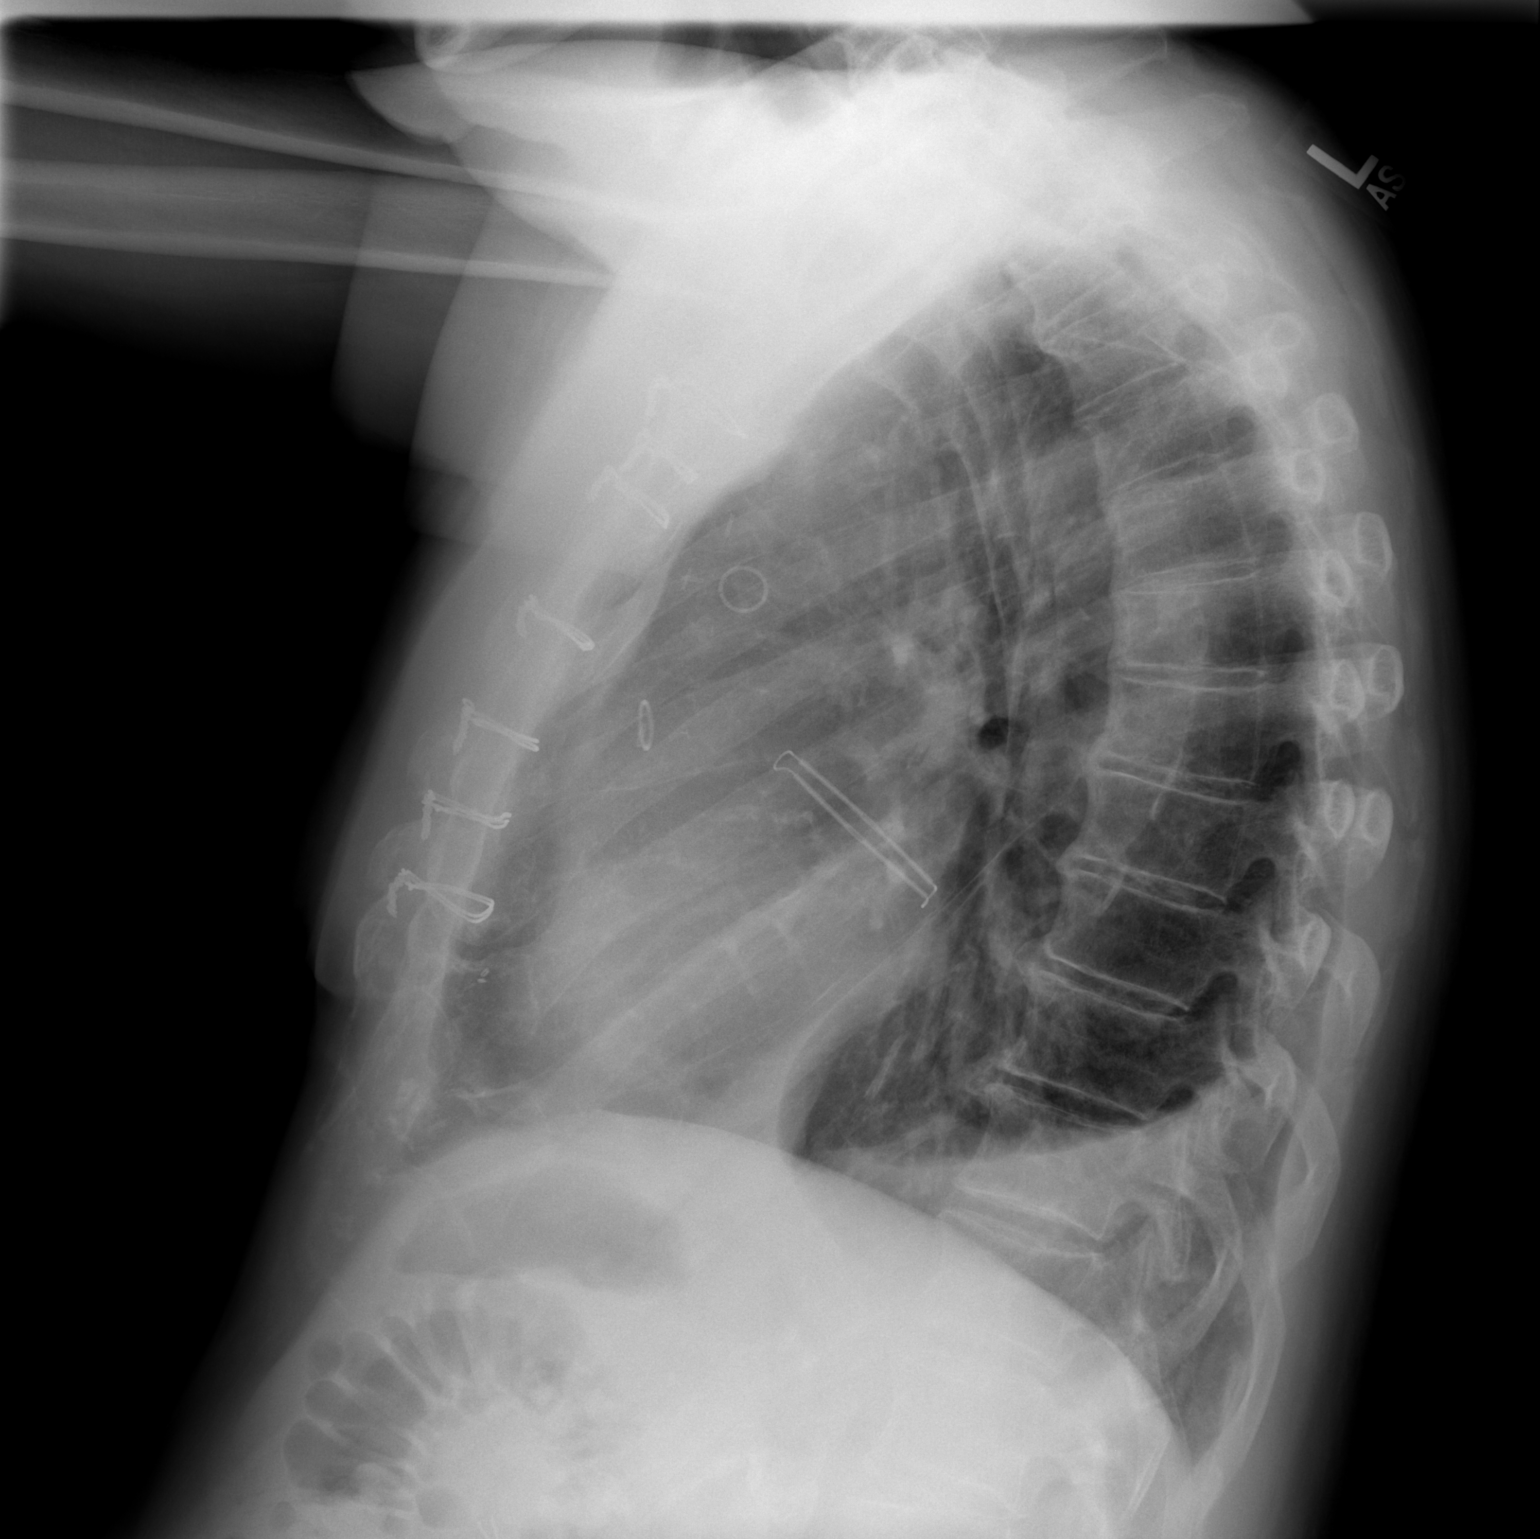

[2 of 2 positions shown; findings below may reference images not displayed]

FINDINGS: Lungs are essentially clear.  Small pleural effusion,
stable versus mildly decreased.  No pneumothorax.

The heart is top normal in size. Postsurgical changes related to
prior CABG.

Degenerative changes of the visualized thoracolumbar spine.
IMPRESSION: Small left pleural effusion, stable versus mildly decreased.

## 2013-12-10 ENCOUNTER — Encounter (HOSPITAL_COMMUNITY): Payer: Self-pay | Admitting: Cardiovascular Disease

## 2014-02-09 ENCOUNTER — Other Ambulatory Visit: Payer: Self-pay | Admitting: *Deleted

## 2014-02-09 DIAGNOSIS — I712 Thoracic aortic aneurysm, without rupture, unspecified: Secondary | ICD-10-CM

## 2014-03-29 ENCOUNTER — Encounter: Payer: Self-pay | Admitting: Thoracic Surgery (Cardiothoracic Vascular Surgery)

## 2014-03-29 ENCOUNTER — Other Ambulatory Visit: Payer: Self-pay

## 2014-03-30 LAB — CREATININE, SERUM: CREATININE: 1.4 mg/dL — AB (ref 0.50–1.35)

## 2014-03-30 LAB — BUN: BUN: 23 mg/dL (ref 6–23)

## 2014-04-01 ENCOUNTER — Ambulatory Visit (INDEPENDENT_AMBULATORY_CARE_PROVIDER_SITE_OTHER): Payer: Medicare Other | Admitting: Podiatry

## 2014-04-01 ENCOUNTER — Encounter: Payer: Self-pay | Admitting: Podiatry

## 2014-04-01 VITALS — BP 192/81 | HR 63 | Resp 13 | Ht 74.0 in | Wt 240.0 lb

## 2014-04-01 DIAGNOSIS — L03039 Cellulitis of unspecified toe: Secondary | ICD-10-CM

## 2014-04-01 DIAGNOSIS — L03012 Cellulitis of left finger: Secondary | ICD-10-CM

## 2014-04-01 MED ORDER — CEPHALEXIN 500 MG PO CAPS: 500.0000 mg | ORAL_CAPSULE | Freq: Two times a day (BID) | ORAL | Status: DC

## 2014-04-01 NOTE — Progress Notes (Signed)
Subjective:     Patient ID: [name] [name], male   DOB: 1926-04-20, 81 y.o.   MRN: [id number]  HPI patient presents with inflamed right hallux medial border that's hard for him to wear shoe gear with it is noted drainage coming from it but no odor   Review of Systems     Objective:   Physical Exam Neurovascular status intact muscle strength adequate with redness and drainage in the medial border of the right hallux proximal that's localized with no further proximal edema erythema or drainage noted    Assessment:     Paronychia infection right hallux medial border    Plan:     Reviewed condition and infiltrated with 60 g I can Marcaine mixture and then remove the medial border some abscess tissue and allowed channel for drainage. Placed on antibiotics cephalexin 500 mg twice a day and instructed on soaks and reappoint if any further redness or any issues were to occur with condition

## 2014-04-01 NOTE — Progress Notes (Signed)
   Subjective:    Patient ID: [name] [name], male    DOB: 10/18/1926, 81 y.o.   MRN: [id number]  HPI Comments: Fraser Din complains of a painful right 1st toenail base infection.     Review of Systems  All other systems reviewed and are negative.      Objective:   Physical Exam        Assessment & Plan:

## 2014-04-01 NOTE — Patient Instructions (Signed)

## 2014-04-05 ENCOUNTER — Ambulatory Visit (INDEPENDENT_AMBULATORY_CARE_PROVIDER_SITE_OTHER): Payer: Medicare Other | Admitting: Thoracic Surgery (Cardiothoracic Vascular Surgery)

## 2014-04-05 ENCOUNTER — Ambulatory Visit
Admission: RE | Admit: 2014-04-05 | Discharge: 2014-04-05 | Disposition: A | Discharge status: Home / Self Care | Payer: Medicare Other | Source: Ambulatory Visit | Attending: Thoracic Surgery (Cardiothoracic Vascular Surgery) | Admitting: Thoracic Surgery (Cardiothoracic Vascular Surgery)

## 2014-04-05 ENCOUNTER — Encounter: Payer: Self-pay | Admitting: Thoracic Surgery (Cardiothoracic Vascular Surgery)

## 2014-04-05 VITALS — BP 113/66 | HR 80 | Resp 20 | Ht 74.0 in | Wt 240.0 lb

## 2014-04-05 DIAGNOSIS — Z9889 Other specified postprocedural states: Secondary | ICD-10-CM | POA: Diagnosis not present

## 2014-04-05 DIAGNOSIS — I712 Thoracic aortic aneurysm, without rupture, unspecified: Secondary | ICD-10-CM

## 2014-04-05 DIAGNOSIS — Z8679 Personal history of other diseases of the circulatory system: Secondary | ICD-10-CM

## 2014-04-05 DIAGNOSIS — I7121 Aneurysm of the ascending aorta, without rupture: Secondary | ICD-10-CM

## 2014-04-05 DIAGNOSIS — Z951 Presence of aortocoronary bypass graft: Secondary | ICD-10-CM | POA: Diagnosis not present

## 2014-04-05 DIAGNOSIS — I77810 Thoracic aortic ectasia: Secondary | ICD-10-CM

## 2014-04-05 MED ORDER — IOPAMIDOL (ISOVUE-370) INJECTION 76%
75.0000 mL | Freq: Once | INTRAVENOUS | Status: AC | PRN
  Administered 2014-04-05: 75 mL via INTRAVENOUS

## 2014-04-05 NOTE — Progress Notes (Signed)
[address]       [place] [address]             [phone]     CARDIOTHORACIC SURGERY OFFICE NOTE  Referring Provider is Jacolyn Reedy, MD PCP is [name], MD   HPI:  Patient is an 81 year old male with history of coronary artery disease, hypertension, hyperlipidemia, and atrial fibrillation who underwent coronary artery bypass grafting 3 and Maze procedure on 03/06/2012 who returns to the office today for routine follow-up and surveillance related to mild aneurysmal enlargement of the proximal ascending thoracic aorta. He was last seen here in our office on 03/30/2013.  He reports no new problems or complaints over the past year. He describes stable symptoms of mild exertional shortness of breath which typically occur only with more strenuous physical activity. He denies any history of substernal chest pain or chest tightness either with activity or at rest. He has not had any palpitations or other symptoms to suggest a recurrence of atrial fibrillation. He reports no other new significant problems over the past year.   Current Outpatient Prescriptions  Medication Sig Dispense Refill  . allopurinol (ZYLOPRIM) 300 MG tablet Take 150 mg by mouth Daily.    Marland Kitchen aspirin 81 MG tablet Take 81 mg by mouth daily.    . cephALEXin (KEFLEX) 500 MG capsule Take 1 capsule (500 mg total) by mouth 2 (two) times daily. 20 capsule 1  . cholecalciferol (VITAMIN D) 1000 UNITS tablet Take 1,000 Units by mouth daily.    . DiphenhydrAMINE HCl (BENADRYL ALLERGY PO) Take 25 mg by mouth as needed.     Marland Kitchen EPINEPHrine (EPIPEN 2-PAK) 0.3 mg/0.3 mL DEVI Inject 0.3 mg into the muscle See admin instructions. For severe allergic reaction    . fish oil-omega-3 fatty acids 1000 MG capsule Take 1 g by mouth daily.     . furosemide (LASIX) 40 MG tablet Take 20 mg by mouth daily.    Marland Kitchen galantamine (RAZADYNE ER) 8 MG 24 hr capsule Take 8 mg by mouth Daily.    . HONEY BEE VENOM IJ Inject as  directed.    Marland Kitchen NITROSTAT 0.4 MG SL tablet Place 1 tablet under the tongue every 5 (five) minutes x 3 doses as needed for chest pain.     Marland Kitchen psyllium (METAMUCIL) 58.6 % packet Take 1 packet by mouth daily.    . rivaroxaban (XARELTO) 15 MG TABS tablet Take 1 tablet (15 mg total) by mouth daily with supper. 30 tablet 1  . rosuvastatin (CRESTOR) 20 MG tablet Take 10 mg by mouth daily.    Marland Kitchen triamcinolone cream (KENALOG) 0.1 % Apply 1 application topically as needed (dry skin irritation).     . vitamin B-12 (CYANOCOBALAMIN) 1000 MCG tablet Take 1,000 mcg by mouth daily.     Marland Kitchen ZETIA 10 MG tablet Take 10 mg by mouth Daily.     No current facility-administered medications for this visit.      Physical Exam:   BP 113/66 mmHg  Pulse 80  Resp 20  Ht 6\' 2"  (1.88 m)  Wt 240 lb (108.863 kg)  BMI 30.80 kg/m2  SpO2 95%  General:  Obese but well appearing  Chest:   Clear to auscultation  CV:   Regular rate and rhythm with soft systolic murmur along the sternal border  Incisions:  n/a  Abdomen:  Soft and nontender  Extremities:  Warm and well-perfused  Diagnostic Tests:  CT ANGIOGRAPHY CHEST  WITH CONTRAST  TECHNIQUE: Multidetector CT imaging of the chest was performed using the standard protocol during bolus administration of intravenous contrast. Multiplanar CT image reconstructions and MIPs were obtained to evaluate the vascular anatomy.  CONTRAST: 75 mL of Isovue 370 intravenously.  COMPARISON: CT scan of September 26, 2012.  FINDINGS: No pneumothorax or pleural effusion is noted. Stable biapical scarring is noted. No acute pulmonary disease is noted. There is no evidence of pulmonary embolus. Atherosclerotic calcifications of the great vessels are noted without significant stenosis. No significant mediastinal adenopathy is noted. Status post coronary artery bypass graft.  Maximum measured diameter of ascending thoracic aorta is 4.5 cm, without evidence of dissection. No  significant aneurysmal dilatation is seen involving the remaining portions of the thoracic aorta. Irregular atheromatous plaque is noted at the thoracoabdominal aortic junction with probable atherosclerotic ulcers. The visualized portion of the upper abdomen appears normal.  Review of the MIP images confirms the above findings.  IMPRESSION: Ascending thoracic aortic aneurysm measuring 4.5 cm is noted which is not significantly changed compared to prior exam. There is no evidence of dissection. Recommend semi-annual imaging followup by CTA or MRA and referral to cardiothoracic surgery if not already obtained. This recommendation follows 2010 ACCF/AHA/AATS/ACR/ASA/SCA/SCAI/SIR/STS/SVM Guidelines for the Diagnosis and Management of Patients With Thoracic Aortic Disease. Circulation. 2010; [id number]   Electronically Signed  By: Marijo Conception, M.D.  On: 04/05/2014 14:14   Impression:  Patient has stable very mild aneurysmal enlargement of the proximal ascending thoracic aorta. Maximal transverse diameter of the aorta is measured 4.5 cm which for a gentleman this size is only mildly enlarged.  The patient has remained in sinus rhythm and otherwise is doing well from a cardiac standpoint. However, he was apparently recently taken off of his beta blocker and his blood pressure is quite high in our office today.   Plan:  I have instructed the patient to resume taking atenolol at his previous dose. Given the history of hypertension and aneurysmal enlargement of the ascending aorta, I feel that long-term beta blocker therapy is indicated. I've instructed the patient to begin recording his blood pressure every morning and keep a log. I have also instructed the patient schedule follow-up appointment with Dr. Joylene Draft or Dr. Wynonia Lawman in the near future to make sure his hypertension is brought under adequate control.  The patient will return in 1 year for routine follow-up with repeat CT  angiogram of the chest.  I spent in excess of 15 minutes during the conduct of this office consultation and >50% of this time involved direct face-to-face encounter with the patient for counseling and/or coordination of their care.   Valentina Gu. Roxy Manns, MD 04/05/2014 3:27 PM

## 2014-04-05 NOTE — Patient Instructions (Signed)
Resume taking Atenolol at your previous dose  Begin checking your blood pressure daily and keep a record  Call and schedule an appointment with Dr Joylene Draft or Dr Wynonia Lawman as soon as possible for follow up of high blood pressure

## 2014-05-04 DIAGNOSIS — I209 Angina pectoris, unspecified: Secondary | ICD-10-CM | POA: Insufficient documentation

## 2014-06-28 ENCOUNTER — Encounter (HOSPITAL_BASED_OUTPATIENT_CLINIC_OR_DEPARTMENT_OTHER): Payer: Medicare Other

## 2014-07-29 ENCOUNTER — Ambulatory Visit (HOSPITAL_BASED_OUTPATIENT_CLINIC_OR_DEPARTMENT_OTHER): Payer: Medicare Other | Attending: Cardiology

## 2014-07-29 VITALS — Ht 74.0 in | Wt 240.0 lb

## 2014-07-29 DIAGNOSIS — R0683 Snoring: Secondary | ICD-10-CM | POA: Diagnosis not present

## 2014-07-29 DIAGNOSIS — G4761 Periodic limb movement disorder: Secondary | ICD-10-CM | POA: Insufficient documentation

## 2014-07-29 DIAGNOSIS — G4733 Obstructive sleep apnea (adult) (pediatric): Secondary | ICD-10-CM | POA: Insufficient documentation

## 2014-07-29 DIAGNOSIS — I519 Heart disease, unspecified: Secondary | ICD-10-CM

## 2014-08-07 DIAGNOSIS — I519 Heart disease, unspecified: Secondary | ICD-10-CM | POA: Diagnosis not present

## 2014-08-07 DIAGNOSIS — G4733 Obstructive sleep apnea (adult) (pediatric): Secondary | ICD-10-CM | POA: Diagnosis not present

## 2014-08-07 NOTE — Progress Notes (Signed)
Patient Name: [name] [name], [name] [name] Date: 07/29/2014 Gender: Male D.O.B: [birth year] Age (years): 88 Referring Provider: Landry Corporal Height (inches): 39 Interpreting Physician: Baird Lyons MD, ABSM Weight (lbs): 240 RPSGT: Baxter Flattery BMI: 33 MRN: [id number] Neck Size: 19.00 CLINICAL INFORMATION Sleep Study Type: Split Night CPAP  Indication for sleep study: Fatigue, OSA, Snoring, Witnessed Apneas  Epworth Sleepiness Score: 13  SLEEP STUDY TECHNIQUE As per the AASM Manual for the Scoring of Sleep and Associated Events v2.3 (April 2016) with a hypopnea requiring 4% desaturations.  The channels recorded and monitored were frontal, central and occipital EEG, electrooculogram (EOG), submentalis EMG (chin), nasal and oral airflow, thoracic and abdominal wall motion, anterior tibialis EMG, snore microphone, electrocardiogram, and pulse oximetry. Continuous positive airway pressure (CPAP) was initiated when the patient met split night criteria and was titrated according to treat sleep-disordered breathing.  MEDICATIONS Medications taken by the patient : Charted for review Medications administered by patient during sleep study : No sleep medicine administered.  RESPIRATORY PARAMETERS Diagnostic  Total AHI (/hr): 29.5 RDI (/hr): 29.5 OA Index (/hr): 23 CA Index (/hr): 0.0 REM AHI (/hr): 22.9 NREM AHI (/hr): 30.1 Supine AHI (/hr): 74.7 Non-supine AHI (/hr): 20.46 Min O2 Sat (%): 88.00 Mean O2 (%): 93.65 Time below 88% (min): 0.4   Titration  Optimal Pressure (cm): 12 AHI at Optimal Pressure (/hr): 0.0 Min O2 at Optimal Pressure (%): 89.0 Supine % at Optimal (%): 15 Sleep % at Optimal (%): 35   SLEEP ARCHITECTURE The recording time for the entire night was 384.1 minutes.  During a baseline period of 201.6 minutes, the patient slept for 130.2 minutes in REM and nonREM, yielding a sleep efficiency of 64.6%. Sleep onset after lights out was 62.4 minutes with a REM latency of  56.0 minutes. The patient spent 6.91% of the night in stage N1 sleep, 85.02% in stage N2 sleep, 0.00% in stage N3 and 8.07% in REM.  During the titration period of 178.8 minutes, the patient slept for 104.8 minutes in REM and nonREM, yielding a sleep efficiency of 58.6%. Sleep onset after CPAP initiation was 6.1 minutes with a REM latency of 27.5 minutes. The patient spent 14.32% of the night in stage N1 sleep, 67.55% in stage N2 sleep, 0.00% in stage N3 and 18.14% in REM.  CARDIAC DATA The 2 lead EKG demonstrated sinus rhythm. The mean heart rate was 48.72 beats per minute. Other EKG findings include: None.  LEG MOVEMENT DATA The total Periodic Limb Movements of Sleep (PLMS) were 503. The PLMS index was 128.01 .  IMPRESSIONS Moderate obstructive sleep apnea occurred during the diagnostic portion of the study(AHI = 29.5/hour). An optimal PAP pressure was selected for this patient ( 12 cm of water) No significant central sleep apnea occurred during the diagnostic portion of the study (CAI = 0.0/hour). Moderate oxygen desaturation was noted during the diagnostic portion of the study (Min O2 =88.00%). The patient snored with Soft snoring volume during the diagnostic portion of the study. No cardiac abnormalities were noted during this study. Severe periodic limb movements of sleep occurred during the study.  DIAGNOSIS Obstructive Sleep Apnea (327.23 [G47.33 ICD-10]) Periodic limb movement in sleep with arousal  RECOMMENDATIONS Trial of CPAP therapy on 12 cm H2O with a Large size Fisher&Paykel Full Face Mask Simplus mask and heated humidification. If limb movements continue to disturb sleep, consider trial of specific therapy such as Requip or Mirapex. Avoid alcohol, sedatives and other CNS depressants that may worsen sleep apnea and disrupt  normal sleep architecture. Sleep hygiene should be reviewed to assess factors that may improve sleep quality. Weight management and regular exercise  should be initiated or continued.   Deneise Lever Diplomate, American Board of Sleep Medicine  ELECTRONICALLY SIGNED ON:  08/07/2014, 3:41 PM [place] PH: [phone]   FX: [phone] [place]

## 2014-09-02 ENCOUNTER — Encounter: Payer: Self-pay | Admitting: Gastroenterology

## 2014-09-30 ENCOUNTER — Encounter: Payer: Self-pay | Admitting: Pulmonary Disease

## 2014-09-30 ENCOUNTER — Ambulatory Visit (INDEPENDENT_AMBULATORY_CARE_PROVIDER_SITE_OTHER): Payer: Medicare Other

## 2014-09-30 ENCOUNTER — Ambulatory Visit (INDEPENDENT_AMBULATORY_CARE_PROVIDER_SITE_OTHER): Payer: Medicare Other | Admitting: Pulmonary Disease

## 2014-09-30 VITALS — BP 130/88 | HR 62 | Ht 73.5 in | Wt 244.0 lb

## 2014-09-30 DIAGNOSIS — G4733 Obstructive sleep apnea (adult) (pediatric): Secondary | ICD-10-CM | POA: Diagnosis not present

## 2014-09-30 DIAGNOSIS — Z91038 Other insect allergy status: Secondary | ICD-10-CM

## 2014-09-30 DIAGNOSIS — Z91030 Bee allergy status: Secondary | ICD-10-CM

## 2014-09-30 MED ORDER — EPINEPHRINE 0.3 MG/0.3ML IJ SOAJ: 0.3000 mg | Freq: Once | INTRAMUSCULAR | Status: DC

## 2014-09-30 NOTE — Progress Notes (Deleted)
   Subjective:    Patient ID: [name] [name], male    DOB: [birth year], 81 y.o.   MRN: [id number]  HPI    Review of Systems  Constitutional: Negative for fever and unexpected weight change.  HENT: Negative for congestion, dental problem, ear pain, nosebleeds, postnasal drip, rhinorrhea, sinus pressure, sneezing, sore throat and trouble swallowing.   Eyes: Negative for redness and itching.  Respiratory: Positive for shortness of breath. Negative for cough, chest tightness and wheezing.   Cardiovascular: Negative for palpitations and leg swelling.  Gastrointestinal: Negative for nausea and vomiting.  Genitourinary: Negative for dysuria.  Musculoskeletal: Negative for joint swelling.  Skin: Negative for rash.  Neurological: Negative for headaches.  Hematological: Does not bruise/bleed easily.  Psychiatric/Behavioral: Negative for dysphoric mood. The patient is not nervous/anxious.        Objective:   Physical Exam        Assessment & Plan:

## 2014-09-30 NOTE — Patient Instructions (Signed)
Will arrange for CPAP set up  Follow up in 2 months after CPAP set up 

## 2014-09-30 NOTE — Progress Notes (Signed)
Chief Complaint  Patient presents with  . Sleep Consult    referred by Dr. Wynonia Lawman for OSA    History of Present Illness: [name] [name] is a 81 y.o. male for evaluation of sleep problems.  He was seen recently by Dr. Wynonia Lawman.  There was concern about his sleep pattern.  He had sleep study which showed moderate to severe sleep apnea.  He was referred for further assessment.  His wife says that he snores, and he will stop breathing while asleep.  He is not aware of doing this.  He will also talk in his sleep sometimes.  He goes to sleep at 10 pm.  He falls asleep quickly.  He wakes up 1 or 2 times to use the bathroom, and will wake up several other times during the night >> he is not sure what wakes him up.  He gets out of bed at 730 am.  He feels tired in the morning, and will sometimes sleep longer.  He occasionally gets morning headache.  He does not use anything to help him fall sleep or stay awake.  He will fall asleep while working on the computer.  He denies sleep talking, bruxism, or nightmares.  There is no history of restless legs.  He denies sleep hallucinations, sleep paralysis, or cataplexy.  The Epworth score is 6 out of 24.  Tests: Echo 03/10/12 >> EF 45 to 50% PSG 07/29/14 >> AHI 29.5, SaO2 low 88%.  CPAP 12 >> AHI 0.  [name] [name]  has a past medical history of Hypertension; Angina; Coronary artery disease; CAD (coronary artery disease); Hypertension; BPH (benign prostatic hypertrophy); Chronic venous insufficiency; Gout; Hyperlipidemia; Obesity (BMI 30-39.9); Glaucoma; Gout; Acute coronary syndrome (02/29/2012); Atrial fibrillation (02/29/2012); Acute systolic heart failure (0000000); S/P CABG x 3 (03/06/2012); S/P Maze operation for atrial fibrillation (03/06/2012); Ascending aorta dilatation (03/13/2012); and Kidney stones.  [name] [name]  has past surgical history that includes Cardiac catheterization; Prostatectomy; Cataract extraction, bilateral; Coronary artery bypass graft  (N/A, 03/06/2012); MAZE (N/A, 03/06/2012); Intraoprative transesophageal echocardiogram (N/A, 03/06/2012); Endoharvest vein of greater saphenous vein (Right, 03/06/2012); Cholecystectomy (N/A, 03/24/2012); and left heart cath (Right, 03/02/2012).  Prior to Admission medications   Medication Sig Start Date End Date Taking? Authorizing Provider  allopurinol (ZYLOPRIM) 300 MG tablet Take 150 mg by mouth Daily. 02/26/11  Yes Historical Provider, MD  aspirin 81 MG tablet Take 81 mg by mouth daily.   Yes Historical Provider, MD  cholecalciferol (VITAMIN D) 1000 UNITS tablet Take 1,000 Units by mouth daily.   Yes Historical Provider, MD  DiphenhydrAMINE HCl (BENADRYL ALLERGY PO) Take 25 mg by mouth as needed.    Yes Historical Provider, MD  EPINEPHrine (EPIPEN 2-PAK) 0.3 mg/0.3 mL DEVI Inject 0.3 mg into the muscle See admin instructions. For severe allergic reaction   Yes Historical Provider, MD  fish oil-omega-3 fatty acids 1000 MG capsule Take 1 g by mouth daily.    Yes Historical Provider, MD  furosemide (LASIX) 40 MG tablet Take 20 mg by mouth daily. 03/25/12  Yes Jacolyn Reedy, MD  galantamine (RAZADYNE ER) 8 MG 24 hr capsule Take 8 mg by mouth Daily. 02/26/11  Yes Historical Provider, MD  HONEY BEE VENOM IJ Inject as directed.   Yes Historical Provider, MD  NITROSTAT 0.4 MG SL tablet Place 1 tablet under the tongue every 5 (five) minutes x 3 doses as needed for chest pain.  04/01/11  Yes Historical Provider, MD  rivaroxaban (XARELTO) 15 MG  TABS tablet Take 1 tablet (15 mg total) by mouth daily with supper. 03/11/12  Yes Erin R Barrett, PA-C  rosuvastatin (CRESTOR) 20 MG tablet Take 10 mg by mouth daily.   Yes Historical Provider, MD  triamcinolone cream (KENALOG) 0.1 % Apply 1 application topically as needed (dry skin irritation).    Yes Historical Provider, MD  vitamin B-12 (CYANOCOBALAMIN) 1000 MCG tablet Take 1,000 mcg by mouth daily.    Yes Historical Provider, MD  ZETIA 10 MG tablet Take 10 mg by mouth  Daily. 02/26/11  Yes Historical Provider, MD  atenolol (TENORMIN) 50 MG tablet Take 25 mg by mouth daily. 09/07/14   Historical Provider, MD    Allergies  Allergen Reactions  . Bee Venom Anaphylaxis    His family history includes Dementia in his mother; Stroke in his brother.  He  reports that he has quit smoking. He has never used smokeless tobacco. He reports that he drinks about 0.6 oz of alcohol per week.  Review of Systems  Constitutional: Negative for fever and unexpected weight change.  HENT: Negative for congestion, dental problem, ear pain, nosebleeds, postnasal drip, rhinorrhea, sinus pressure, sneezing, sore throat and trouble swallowing.   Eyes: Negative for redness and itching.  Respiratory: Positive for shortness of breath. Negative for cough, chest tightness and wheezing.   Cardiovascular: Negative for palpitations and leg swelling.  Gastrointestinal: Negative for nausea and vomiting.  Genitourinary: Negative for dysuria.  Musculoskeletal: Negative for joint swelling.  Skin: Negative for rash.  Neurological: Negative for headaches.  Hematological: Does not bruise/bleed easily.  Psychiatric/Behavioral: Negative for dysphoric mood. The patient is not nervous/anxious.    Physical Exam: BP 130/88 mmHg  Pulse 62  Ht 6' 1.5" (1.867 m)  Wt 244 lb (110.678 kg)  BMI 31.75 kg/m2  SpO2 96%  General - No distress ENT - No sinus tenderness, no oral exudate, no LAN, no thyromegaly, TM clear, pupils equal/reactive, MP 4, scalloped tongue Cardiac - s1s2 regular, 2/6 systolic murmur, pulses symmetric Chest - No wheeze/rales/dullness, good air entry, normal respiratory excursion Back - No focal tenderness Abd - Soft, non-tender, no organomegaly, + bowel sounds Ext - No edema Neuro - Normal strength, cranial nerves intact Skin - No rashes Psych - Normal mood, and behavior  Discussion: He has snoring, sleep disruption, witnessed apnea, and daytime sleepiness.  He has hx of CAD,  HTN, A fib, systolic CHF.  His sleep study showed moderate to severe sleep apnea.  He did well with CPAP 12 cm H2O during titration portion.  I have reviewed the recent sleep study results with the patient.  We discussed how sleep apnea can affect various health problems including risks for hypertension, cardiovascular disease, and diabetes.  We also discussed how sleep disruption can increase risks for accident, such as while driving.  Weight loss as a means of improving sleep apnea was also reviewed.  Additional treatment options discussed were CPAP therapy, oral appliance, and surgical intervention.  Assessment/plan:  Obstructive sleep apnea. Plan: - will arrange for CPAP 12 cm H2O  Obesity. Plan: - discussed importance of weight loss   Chesley Mires, M.D. Pager [phone]

## 2014-09-30 NOTE — Progress Notes (Signed)
This encounter was created in error - please disregard.

## 2014-10-24 ENCOUNTER — Emergency Department (HOSPITAL_COMMUNITY): Payer: Medicare Other

## 2014-10-24 ENCOUNTER — Encounter (HOSPITAL_COMMUNITY): Payer: Self-pay | Admitting: Emergency Medicine

## 2014-10-24 ENCOUNTER — Observation Stay (HOSPITAL_COMMUNITY)
Admission: EM | Admit: 2014-10-24 | Discharge: 2014-10-25 | Disposition: A | Discharge status: Home / Self Care | Payer: Medicare Other | Attending: Internal Medicine | Admitting: Internal Medicine

## 2014-10-24 DIAGNOSIS — I5021 Acute systolic (congestive) heart failure: Secondary | ICD-10-CM | POA: Insufficient documentation

## 2014-10-24 DIAGNOSIS — Z79899 Other long term (current) drug therapy: Secondary | ICD-10-CM | POA: Insufficient documentation

## 2014-10-24 DIAGNOSIS — I2510 Atherosclerotic heart disease of native coronary artery without angina pectoris: Secondary | ICD-10-CM | POA: Diagnosis present

## 2014-10-24 DIAGNOSIS — R079 Chest pain, unspecified: Secondary | ICD-10-CM | POA: Diagnosis not present

## 2014-10-24 DIAGNOSIS — Z7901 Long term (current) use of anticoagulants: Secondary | ICD-10-CM | POA: Diagnosis not present

## 2014-10-24 DIAGNOSIS — Z87891 Personal history of nicotine dependence: Secondary | ICD-10-CM | POA: Diagnosis not present

## 2014-10-24 DIAGNOSIS — E669 Obesity, unspecified: Secondary | ICD-10-CM | POA: Diagnosis present

## 2014-10-24 DIAGNOSIS — I7121 Aneurysm of the ascending aorta, without rupture: Secondary | ICD-10-CM | POA: Diagnosis present

## 2014-10-24 DIAGNOSIS — Z951 Presence of aortocoronary bypass graft: Secondary | ICD-10-CM

## 2014-10-24 DIAGNOSIS — Z7982 Long term (current) use of aspirin: Secondary | ICD-10-CM | POA: Insufficient documentation

## 2014-10-24 DIAGNOSIS — I480 Paroxysmal atrial fibrillation: Secondary | ICD-10-CM | POA: Diagnosis present

## 2014-10-24 DIAGNOSIS — I872 Venous insufficiency (chronic) (peripheral): Secondary | ICD-10-CM | POA: Diagnosis not present

## 2014-10-24 DIAGNOSIS — H409 Unspecified glaucoma: Secondary | ICD-10-CM | POA: Insufficient documentation

## 2014-10-24 DIAGNOSIS — M109 Gout, unspecified: Secondary | ICD-10-CM | POA: Insufficient documentation

## 2014-10-24 DIAGNOSIS — I10 Essential (primary) hypertension: Secondary | ICD-10-CM | POA: Diagnosis not present

## 2014-10-24 DIAGNOSIS — E785 Hyperlipidemia, unspecified: Secondary | ICD-10-CM | POA: Diagnosis not present

## 2014-10-24 DIAGNOSIS — R0789 Other chest pain: Secondary | ICD-10-CM | POA: Diagnosis present

## 2014-10-24 DIAGNOSIS — Z87442 Personal history of urinary calculi: Secondary | ICD-10-CM | POA: Insufficient documentation

## 2014-10-24 DIAGNOSIS — I25119 Atherosclerotic heart disease of native coronary artery with unspecified angina pectoris: Secondary | ICD-10-CM | POA: Diagnosis not present

## 2014-10-24 DIAGNOSIS — N400 Enlarged prostate without lower urinary tract symptoms: Secondary | ICD-10-CM | POA: Insufficient documentation

## 2014-10-24 DIAGNOSIS — I4891 Unspecified atrial fibrillation: Secondary | ICD-10-CM | POA: Diagnosis not present

## 2014-10-24 DIAGNOSIS — I712 Thoracic aortic aneurysm, without rupture: Secondary | ICD-10-CM | POA: Diagnosis present

## 2014-10-24 LAB — BASIC METABOLIC PANEL
Anion gap: 8 (ref 5–15)
BUN: 22 mg/dL — AB (ref 6–20)
CALCIUM: 9.7 mg/dL (ref 8.9–10.3)
CO2: 26 mmol/L (ref 22–32)
Chloride: 103 mmol/L (ref 101–111)
Creatinine, Ser: 1.21 mg/dL (ref 0.61–1.24)
GFR calc Af Amer: >60 mL/min (ref >60)
GFR, EST NON AFRICAN AMERICAN: 52 mL/min — AB (ref >60)
GLUCOSE: 147 mg/dL — AB (ref 65–99)
POTASSIUM: 4.3 mmol/L (ref 3.5–5.1)
SODIUM: 137 mmol/L (ref 135–145)

## 2014-10-24 LAB — CBC
HCT: 37.1 % — ABNORMAL LOW (ref 39.0–52.0)
Hemoglobin: 12.2 g/dL — ABNORMAL LOW (ref 13.0–17.0)
MCH: 30.2 pg (ref 26.0–34.0)
MCHC: 32.9 g/dL (ref 30.0–36.0)
MCV: 91.8 fL (ref 78.0–100.0)
PLATELETS: 171 10*3/uL (ref 150–400)
RBC: 4.04 MIL/uL — ABNORMAL LOW (ref 4.22–5.81)
RDW: 13.7 % (ref 11.5–15.5)
WBC: 4.5 10*3/uL (ref 4.0–10.5)

## 2014-10-24 LAB — TROPONIN I: Troponin I: <0.03 ng/mL (ref <0.031)

## 2014-10-24 LAB — BRAIN NATRIURETIC PEPTIDE: B NATRIURETIC PEPTIDE 5: 196.3 pg/mL — AB (ref 0.0–100.0)

## 2014-10-24 LAB — I-STAT TROPONIN, ED: TROPONIN I, POC: 0.01 ng/mL (ref 0.00–0.08)

## 2014-10-24 MED ORDER — RIVAROXABAN 20 MG PO TABS
20.0000 mg | ORAL_TABLET | Freq: Every day | ORAL | Status: DC
  Administered 2014-10-25: 20 mg via ORAL
  Filled 2014-10-24: qty 1

## 2014-10-24 MED ORDER — EZETIMIBE 10 MG PO TABS
10.0000 mg | ORAL_TABLET | Freq: Every day | ORAL | Status: DC
  Administered 2014-10-25: 10 mg via ORAL
  Filled 2014-10-24: qty 1

## 2014-10-24 MED ORDER — TRAMADOL HCL 50 MG PO TABS
50.0000 mg | ORAL_TABLET | Freq: Four times a day (QID) | ORAL | Status: DC | PRN
  Administered 2014-10-24 – 2014-10-25 (×3): 50 mg via ORAL
  Filled 2014-10-24 (×3): qty 1

## 2014-10-24 MED ORDER — ROSUVASTATIN CALCIUM 10 MG PO TABS
10.0000 mg | ORAL_TABLET | Freq: Every day | ORAL | Status: DC
Start: 2014-10-25 — End: 2014-10-25
  Administered 2014-10-25: 10 mg via ORAL
  Filled 2014-10-24: qty 1

## 2014-10-24 MED ORDER — OMEGA-3-ACID ETHYL ESTERS 1 G PO CAPS
1.0000 g | ORAL_CAPSULE | Freq: Every day | ORAL | Status: DC
  Administered 2014-10-25: 1 g via ORAL
  Filled 2014-10-24: qty 1

## 2014-10-24 MED ORDER — GALANTAMINE HYDROBROMIDE ER 8 MG PO CP24
8.0000 mg | ORAL_CAPSULE | Freq: Every day | ORAL | Status: DC
  Administered 2014-10-25: 8 mg via ORAL
  Filled 2014-10-24 (×2): qty 1

## 2014-10-24 MED ORDER — EPINEPHRINE 0.3 MG/0.3ML IJ SOAJ: 0.3000 mg | Freq: Once | INTRAMUSCULAR | Status: DC

## 2014-10-24 MED ORDER — MORPHINE SULFATE (PF) 2 MG/ML IV SOLN
1.0000 mg | INTRAVENOUS | Status: DC | PRN
  Administered 2014-10-24: 1 mg via INTRAVENOUS
  Filled 2014-10-24: qty 1

## 2014-10-24 MED ORDER — OMEGA-3 FATTY ACIDS 1000 MG PO CAPS: 1.0000 g | ORAL_CAPSULE | Freq: Every day | ORAL | Status: DC

## 2014-10-24 MED ORDER — ASPIRIN 81 MG PO TABS: 81.0000 mg | ORAL_TABLET | Freq: Every day | ORAL | Status: DC

## 2014-10-24 MED ORDER — VITAMIN B-12 1000 MCG PO TABS
1000.0000 ug | ORAL_TABLET | Freq: Every day | ORAL | Status: DC
  Administered 2014-10-25: 1000 ug via ORAL
  Filled 2014-10-24: qty 1

## 2014-10-24 MED ORDER — SODIUM CHLORIDE 0.9 % IV BOLUS (SEPSIS)
250.0000 mL | Freq: Once | INTRAVENOUS | Status: AC
  Administered 2014-10-24: 250 mL via INTRAVENOUS

## 2014-10-24 MED ORDER — ACETAMINOPHEN 325 MG PO TABS
650.0000 mg | ORAL_TABLET | ORAL | Status: DC | PRN
  Administered 2014-10-24: 650 mg via ORAL
  Filled 2014-10-24: qty 2

## 2014-10-24 MED ORDER — METHOCARBAMOL 500 MG PO TABS
500.0000 mg | ORAL_TABLET | Freq: Four times a day (QID) | ORAL | Status: DC
  Administered 2014-10-24 – 2014-10-25 (×5): 500 mg via ORAL
  Filled 2014-10-24 (×6): qty 1

## 2014-10-24 MED ORDER — NITROGLYCERIN 0.4 MG SL SUBL: 0.4000 mg | SUBLINGUAL_TABLET | SUBLINGUAL | Status: DC | PRN

## 2014-10-24 MED ORDER — NALOXONE HCL 0.4 MG/ML IJ SOLN: 0.4000 mg | Freq: Once | INTRAMUSCULAR | Status: DC

## 2014-10-24 MED ORDER — METHOCARBAMOL 500 MG PO TABS
500.0000 mg | ORAL_TABLET | Freq: Four times a day (QID) | ORAL | Status: DC | PRN
  Administered 2014-10-24: 500 mg via ORAL
  Filled 2014-10-24: qty 1

## 2014-10-24 MED ORDER — DIPHENHYDRAMINE HCL 25 MG PO TABS
25.0000 mg | ORAL_TABLET | Freq: Four times a day (QID) | ORAL | Status: DC | PRN
  Filled 2014-10-24: qty 1

## 2014-10-24 MED ORDER — MORPHINE SULFATE (PF) 4 MG/ML IV SOLN
4.0000 mg | Freq: Once | INTRAVENOUS | Status: AC
  Administered 2014-10-24: 4 mg via INTRAVENOUS
  Filled 2014-10-24: qty 1

## 2014-10-24 MED ORDER — GI COCKTAIL ~~LOC~~: 30.0000 mL | Freq: Four times a day (QID) | ORAL | Status: DC | PRN

## 2014-10-24 MED ORDER — ONDANSETRON HCL 4 MG/2ML IJ SOLN: 4.0000 mg | Freq: Four times a day (QID) | INTRAMUSCULAR | Status: DC | PRN

## 2014-10-24 MED ORDER — ASPIRIN 325 MG PO TABS
650.0000 mg | ORAL_TABLET | Freq: Once | ORAL | Status: AC
  Administered 2014-10-24: 650 mg via ORAL
  Filled 2014-10-24: qty 2

## 2014-10-24 MED ORDER — FUROSEMIDE 20 MG PO TABS
20.0000 mg | ORAL_TABLET | Freq: Every day | ORAL | Status: DC
  Administered 2014-10-25: 20 mg via ORAL
  Filled 2014-10-24: qty 1

## 2014-10-24 MED ORDER — ATENOLOL 25 MG PO TABS
12.5000 mg | ORAL_TABLET | Freq: Every day | ORAL | Status: DC
  Administered 2014-10-25: 12.5 mg via ORAL
  Filled 2014-10-24: qty 1

## 2014-10-24 MED ORDER — MUSCLE RUB 10-15 % EX CREA
TOPICAL_CREAM | Freq: Three times a day (TID) | CUTANEOUS | Status: DC
  Administered 2014-10-24: 1 via TOPICAL
  Administered 2014-10-25 (×2): via TOPICAL
  Filled 2014-10-24: qty 85

## 2014-10-24 MED ORDER — ASPIRIN EC 81 MG PO TBEC
81.0000 mg | DELAYED_RELEASE_TABLET | Freq: Every day | ORAL | Status: DC
  Administered 2014-10-25: 81 mg via ORAL
  Filled 2014-10-24: qty 1

## 2014-10-24 MED ORDER — ACETAMINOPHEN 500 MG PO TABS
1000.0000 mg | ORAL_TABLET | Freq: Once | ORAL | Status: AC
  Administered 2014-10-24: 1000 mg via ORAL
  Filled 2014-10-24: qty 2

## 2014-10-24 MED ORDER — LORAZEPAM 2 MG/ML IJ SOLN
0.5000 mg | Freq: Once | INTRAMUSCULAR | Status: AC
  Administered 2014-10-24: 0.5 mg via INTRAVENOUS
  Filled 2014-10-24: qty 1

## 2014-10-24 MED ORDER — PSYLLIUM 95 % PO PACK
1.0000 | PACK | Freq: Every day | ORAL | Status: DC
  Administered 2014-10-25: 1 via ORAL
  Filled 2014-10-24: qty 1

## 2014-10-24 NOTE — ED Notes (Signed)
Patient transported to X-ray 

## 2014-10-24 NOTE — Progress Notes (Signed)
ANTICOAGULATION CONSULT NOTE - Initial Consult  Pharmacy Consult:  Xarelto  Indication:  Non-valvular Afib  Allergies  Allergen Reactions  . Bee Venom Anaphylaxis  . Morphine And Related Other (See Comments)    hypotension    Patient Measurements: Height: 6' 1.5" (186.7 cm) Weight: 240 lb (108.863 kg) IBW/kg (Calculated) : 81.05  Vital Signs: Temp: 97.4 F (36.3 C) (10/23 1327) Temp Source: Oral (10/23 1327) BP: 137/59 mmHg (10/23 1615) Pulse Rate: 57 (10/23 1615)  Labs:  Recent Labs  10/24/14 1335  HGB 12.2*  HCT 37.1*  PLT 171  CREATININE 1.21    Estimated Creatinine Clearance: 57.1 mL/min (by C-G formula based on Cr of 1.21).   Medical History: Past Medical History  Diagnosis Date  . Hypertension   . Angina   . Coronary artery disease   . CAD (coronary artery disease)     Cath April 2012 40% left main, 50% LAD, 40% OM, occluded RCA with L-R collaterals   . Hypertension   . BPH (benign prostatic hypertrophy)   . Chronic venous insufficiency   . Gout   . Hyperlipidemia   . Obesity (BMI 30-39.9)   . Glaucoma   . Gout   . Acute coronary syndrome ([place]) 02/29/2012  . Atrial fibrillation ([place]) 02/29/2012    Recurrent paroxysmal, new-onset  . Acute systolic heart failure ([place]) 03/03/2012  . S/P CABG x 3 03/06/2012    LIMA to LAD, SVG to OM, SVG to RCA, EVH via right thigh  . S/P Maze operation for atrial fibrillation 03/06/2012    Complete bilateral lesions set using bipolar radiofrequency and cryothermy ablation with clipping of LA appendage  . Ascending aorta dilatation ([facility]) 03/13/2012    Fusiform dilatation of the ascending thoracic aorta discovered during surgery, max diameter 4.2-4.3 cm  . Kidney stones        Assessment: 54 YOM presented with chest pain to continue on Xarelto from PTA for history of Afib.  Aware patient's dose was reduced due to renal insufficiency; however, Xarelto dosing is based on using the total body weight to calculate creatinine  clearance.  Patient's calculated CrCL is ~67 ml/min, appropriate for full dosing.  No bleeding reported.   Goal of Therapy:  Full anticoagulation Monitor platelets by anticoagulation protocol: Yes    Plan:  - Change Xarelto to 20mg  PO daily with supper - Pharmacy will sign off and follow peripherally.  Thank you for the consult!   Makaylie Dedeaux D. Mina Marble, PharmD, BCPS Pager:  [phone] 10/24/2014, 5:15 PM

## 2014-10-24 NOTE — ED Notes (Signed)
MD Laneta Simmers came to bedside spoke in length to pt and visitors about plan of care

## 2014-10-24 NOTE — ED Notes (Signed)
Received pt via EMS with c/o left sided chest pain that radiated to left arm and jaw while at church. Pt took 1 nitro with relief. About 1/2 hour later pain returned. Pt went out to his car and took 2 nitro without relief. Pt was given 2 full strength aspirin by someone at the church.

## 2014-10-24 NOTE — H&P (Signed)
[facility] History and Physical                                                                                    [name] [name], is a 81 y.o. male  MRN: [id number]   DOB - Feb 09, 1926  Admit Date - 10/24/2014  Outpatient Primary MD for the patient is [name], MD  Referring Physician:  Denny Peon  Chief Complaint:   Chief Complaint  Patient presents with  . Chest Pain     HPI  [name] [name]  is a 81 y.o. male, with CAD s/p CABG and 2 cardiac arrests, OSA, afib on rivaroxaban, and an ascending thoracic aneurysm presents to the ER today with left sided chest pain. Over the last several weeks, approximately 2 times a week the patient has had a dull left-sided chest pain that lasts for 10 or 15 minutes and then subsides.  At 11:30 in church today, while at rest, the pain started and was initially relieved by nitroglycerin. It returned 30 minutes later and was more severe. This time it did not respond to nitroglycerin, aspirin or Tylenol. On the way to the emergency department the patient became diaphoretic, dizzy, and short of breath per the EDP's report. The pain radiates down his left arm to his wrist and down his left leg. It is not made better or worse by exertion. The pain is reproducible on palpation. The patient states that previously when he has had this pain at home it has been relieved with massage.  On my exam at 3:30 PM the patient is still having atypical chest pain.   In the ER, point-of-care troponin is not elevated, chest x-ray is negative, EKG shows no ST elevation or depression. He is bradycardic with a ventricle rate of 50.  Review of Systems  Constitutional: Positive for diaphoresis. Negative for fever, chills and weight loss.  HENT: Negative for congestion and sore throat.   Eyes: Positive for redness. Negative for blurred vision and pain.  Respiratory: Negative for cough, sputum production and wheezing.   Cardiovascular: Positive for chest pain and leg  swelling. Negative for palpitations, orthopnea and PND.  Gastrointestinal: Negative for heartburn, nausea, vomiting, abdominal pain and diarrhea.  Genitourinary: Negative for dysuria.  Musculoskeletal: Positive for myalgias.  Skin: Negative for rash.  Neurological: Positive for dizziness. Negative for tingling, tremors and loss of consciousness.  Psychiatric/Behavioral: Negative.   ++ he complains of weakness in his knees that sometimes makes it difficult to stand   Past Medical History  Past Medical History  Diagnosis Date  . Hypertension   . Angina   . Coronary artery disease   . CAD (coronary artery disease)     Cath April 2012 40% left main, 50% LAD, 40% OM, occluded RCA with L-R collaterals   . Hypertension   . BPH (benign prostatic hypertrophy)   . Chronic venous insufficiency   . Gout   . Hyperlipidemia   . Obesity (BMI 30-39.9)   . Glaucoma   . Gout   . Acute coronary syndrome ([place]) 02/29/2012  . Atrial fibrillation ([place]) 02/29/2012    Recurrent paroxysmal, new-onset  . Acute systolic heart failure ([place])  03/03/2012  . S/P CABG x 3 03/06/2012    LIMA to LAD, SVG to OM, SVG to RCA, EVH via right thigh  . S/P Maze operation for atrial fibrillation 03/06/2012    Complete bilateral lesions set using bipolar radiofrequency and cryothermy ablation with clipping of LA appendage  . Ascending aorta dilatation (HCC) 03/13/2012    Fusiform dilatation of the ascending thoracic aorta discovered during surgery, max diameter 4.2-4.3 cm  . Kidney stones     Past Surgical History  Procedure Laterality Date  . Cardiac catheterization    . Prostatectomy    . Cataract extraction, bilateral    . Coronary artery bypass graft N/A 03/06/2012    Procedure: CORONARY ARTERY BYPASS GRAFTING (CABG);  Surgeon: Rexene Alberts, MD;  Location: [place];  Service: Open Heart Surgery;  Laterality: N/A;  . Maze N/A 03/06/2012    Procedure: MAZE;  Surgeon: Rexene Alberts, MD;  Location: [place];  Service: Open  Heart Surgery;  Laterality: N/A;  . Intraoperative transesophageal echocardiogram N/A 03/06/2012    Procedure: INTRAOPERATIVE TRANSESOPHAGEAL ECHOCARDIOGRAM;  Surgeon: Rexene Alberts, MD;  Location: [place];  Service: Open Heart Surgery;  Laterality: N/A;  . Endovein harvest of greater saphenous vein Right 03/06/2012    Procedure: ENDOVEIN HARVEST OF GREATER SAPHENOUS VEIN;  Surgeon: Rexene Alberts, MD;  Location: [place];  Service: Open Heart Surgery;  Laterality: Right;  . Cholecystectomy N/A 03/24/2012    Procedure: LAPAROSCOPIC CHOLECYSTECTOMY WITH INTRAOPERATIVE CHOLANGIOGRAM;  Surgeon: Zenovia Jarred, MD;  Location: [place];  Service: General;  Laterality: N/A;  . Left heart cath Right 03/02/2012    Procedure: LEFT HEART CATH;  Surgeon: Sherren Mocha, MD;  Location: [facility] CATH LAB;  Service: Cardiovascular;  Laterality: Right;      Social History Social History  Substance Use Topics  . Smoking status: Former Research scientist (life sciences)  . Smokeless tobacco: Never Used  . Alcohol Use: 0.6 oz/week    1 Shots of liquor per week     Comment: weekly   patient reports that he lives with his wife and is independent with ADLs. He drinks 2 cocktails each evening before dinner.  Family History Family History  Problem Relation Age of Onset  . Dementia Mother   . Stroke Brother     Prior to Admission medications   Medication Sig Start Date End Date Taking? Authorizing Provider  allopurinol (ZYLOPRIM) 300 MG tablet Take 150 mg by mouth Daily. 02/26/11  Yes Historical Provider, MD  aspirin 325 MG tablet Take 650 mg by mouth once.   Yes Historical Provider, MD  aspirin 81 MG tablet Take 81 mg by mouth daily.   Yes Historical Provider, MD  atenolol (TENORMIN) 50 MG tablet Take 25 mg by mouth daily. 09/07/14  Yes Historical Provider, MD  cholecalciferol (VITAMIN D) 1000 UNITS tablet Take 1,000 Units by mouth daily.   Yes Historical Provider, MD  DiphenhydrAMINE HCl (BENADRYL ALLERGY PO) Take 25 mg by mouth as needed.    Yes  Historical Provider, MD  EPINEPHrine (EPIPEN 2-PAK) 0.3 mg/0.3 mL IJ SOAJ injection Inject 0.3 mLs (0.3 mg total) into the muscle once. [place] [id number] EXP 10/17 09/30/14  Yes Jiles Prows, MD  fish oil-omega-3 fatty acids 1000 MG capsule Take 1 g by mouth daily.    Yes Historical Provider, MD  furosemide (LASIX) 40 MG tablet Take 20 mg by mouth daily. 03/25/12  Yes Jacolyn Reedy, MD  galantamine (RAZADYNE ER) 8 MG 24  hr capsule Take 8 mg by mouth Daily. 02/26/11  Yes Historical Provider, MD  HONEY BEE VENOM IJ Inject as directed.   Yes Historical Provider, MD  NITROSTAT 0.4 MG SL tablet Place 1 tablet under the tongue every 5 (five) minutes x 3 doses as needed for chest pain.  04/01/11  Yes Historical Provider, MD  psyllium (METAMUCIL) 58.6 % powder Take 1 packet by mouth daily.   Yes Historical Provider, MD  rivaroxaban (XARELTO) 15 MG TABS tablet Take 1 tablet (15 mg total) by mouth daily with supper. 03/11/12  Yes Erin R Barrett, PA-C  rosuvastatin (CRESTOR) 20 MG tablet Take 10 mg by mouth daily.   Yes Historical Provider, MD  triamcinolone cream (KENALOG) 0.1 % Apply 1 application topically as needed (dry skin irritation).    Yes Historical Provider, MD  vitamin B-12 (CYANOCOBALAMIN) 1000 MCG tablet Take 1,000 mcg by mouth daily.    Yes Historical Provider, MD  ZETIA 10 MG tablet Take 10 mg by mouth Daily. 02/26/11  Yes Historical Provider, MD    Allergies  Allergen Reactions  . Bee Venom Anaphylaxis  . Morphine And Related Other (See Comments)    hypotension    Physical Exam  Vitals  Blood pressure 142/59, pulse 54, temperature 97.4 F (36.3 C), temperature source Oral, resp. rate 19, height 6' 1.5" (1.867 m), weight 108.863 kg (240 lb), SpO2 98 %.   General:  lying in bed in NAD,   Psych:  Normal affect and insight, Not Suicidal or Homicidal, Awake Alert, Oriented X 3.  Neuro:   No F.N deficits, ALL C.Nerves Intact, Strength 5/5 all 4 extremities, Sensation  intact all 4 extremities.  ENT:  Ears and Eyes appear Normal, Conjunctivae clear, PER. Moist oral mucosa without erythema or exudates.  Neck:  Supple, No lymphadenopathy appreciated  Respiratory:  Symmetrical chest wall movement, Good air movement bilaterally, CTAB.  Cardiac:  RRR, No Murmurs, no LE edema noted, no JVD.    Abdomen:  Positive bowel sounds, Soft, Non tender, Non distended,  No masses appreciated  Skin:  No Cyanosis, Normal Skin Turgor, No Skin Rash or Bruise.  Extremities:  Able to move all 4. 5/5 strength in each,  no effusions.  Data Review  Wt Readings from Last 3 Encounters:  10/24/14 108.863 kg (240 lb)  09/30/14 110.678 kg (244 lb)  07/29/14 108.863 kg (240 lb)    CBC  Recent Labs Lab 10/24/14 1335  WBC 4.5  HGB 12.2*  HCT 37.1*  PLT 171  MCV 91.8  MCH 30.2  MCHC 32.9  RDW 13.7    Chemistries   Recent Labs Lab 10/24/14 1335  NA 137  K 4.3  CL 103  CO2 26  GLUCOSE 147*  BUN 22*  CREATININE 1.21  CALCIUM 9.7        Lab Results  Component Value Date   HGBA1C 6.1* 03/03/2012     Urinalysis not done in ER.  Imaging results:   Dg Chest 2 View  10/24/2014  CLINICAL DATA:  81 year old male with acute left-sided chest pain today. EXAM: CHEST  2 VIEW COMPARISON:  06/23/2013 and prior exams FINDINGS: Cardiomegaly, CABG changes and left atrial appendage exclusion device again noted. There is no evidence of focal airspace disease, pulmonary edema, suspicious pulmonary nodule/mass, pleural effusion, or pneumothorax. No acute bony abnormalities are identified. IMPRESSION: Cardiomegaly without evidence of acute cardiopulmonary disease. Electronically Signed   By: Margarette Canada M.D.   On: 10/24/2014 14:42    My  personal review of EKG: Bradycardia with right bundle branch block, A. fib.   Assessment & Plan  Principal Problem:   Atypical chest pain Active Problems:   CAD (coronary artery disease)   Hyperlipidemia   Obesity (BMI  30-39.9)   Hypertension   Atrial fibrillation (HCC)   S/P CABG x 3   Thoracic ascending aortic aneurysm ([place])   Atypical chest pain Appears to be submuscular skeletal in nature. We'll cycle troponins and repeat 2-D echo given his significant cardiac history. Patient received 2 mg of IV morphine in the ER and required Narcan due to a drop in blood pressure. Will place on scheduled Robaxin, Ephraim Hamburger, heating pad prn.  GI cocktail prn.  Physical therapy eval.  Low dose ativan PRN anxiety.  Monitor on tele.  Consult cardiology if troponin rises.  Continue low dose aspirin.  OSA CPAP QHS  Afib Rate controlled on atenolol.  Will decrease dose given pulse rate of 50.  Continue rivaroxaban.  He was on 15 mg dose daily (renal insufficiency).  Will ask pharm to dose.  Chronic Systolic CHF - compensated - EF 45-50% on ECHO in 03/2012 - cont Lasix, B blocker - f/u repeat ECHO - if EF% depressed, will need ACE I or ARB  HLD  Continue statin and zetia.  Bradycardia Mild.  Will decrease dose of BB.  Patient had previously been taken off of BB, but it was restarted by CVTS in 4/16.  Alcohol dependence Mild.  Has two cocktails daily before dinner.  Will offer PRN ativan.   Consultants Called:  none  Family Communication:   Daughters at bedside  Code Status:  Full code  Condition:  stable  Potential Disposition: to home 10/24 if work up is negative.  Time spent in minutes : [place],  [place] on 10/24/2014 at 4:04 PM Between 7am to 7pm - Pager - [phone] After 7pm go to [email or web address] - password [email or web address] And look for the night coverage person covering me after hours   I have evaluated the patient, reviewed the chart, modified the above note and discussed the case with Imogene Burn, [place]. I agree with the above assessment and plan.   Debbe Odea, MD

## 2014-10-24 NOTE — ED Provider Notes (Signed)
Medical screening examination/treatment/procedure(s) were conducted as a shared visit with non-physician practitioner(s) and myself.  I personally evaluated the patient during the encounter.   EKG Interpretation None     81 year old male with significant cardiac history presents with left-sided noncardiac appearing chest pain that started this morning. He is tender under his left armpit over musculature on his chest wall. The pain is present at rest without provocation however. Will require observation stay for ACS rule out given his high risk demographic.  See related encounter note   Leo Grosser, MD 10/24/14 1539

## 2014-10-24 NOTE — ED Provider Notes (Signed)
CSN: [id number]     Arrival date & time 10/24/14  1313 History   First MD Initiated Contact with Patient 10/24/14 1325     Chief Complaint  Patient presents with  . Chest Pain     HPI   [name] [name] is a 81 y.o. male with a PMH of HTN, HLD, CAD, ACS, CABG (LIMA to LAD, SVG to OM, SVG to RCA) who presents to the ED with chest pain, which started this morning while sitting at church. He states he took one NTG, which relieved his symptoms. His chest pain subsequently returned, and he took 2 nitro without relief. He reports associated diaphoresis, dizziness, shortness of breath. He states his chest pain is constant and left-sided. He denies fever, chills, cough, congestion, abdominal pain, N/V/D/C, weakness, numbness, paresthesia.  Cardiologist: Dr. Wynonia Lawman  Past Medical History  Diagnosis Date  . Hypertension   . Angina   . Coronary artery disease   . CAD (coronary artery disease)     Cath April 2012 40% left main, 50% LAD, 40% OM, occluded RCA with L-R collaterals   . Hypertension   . BPH (benign prostatic hypertrophy)   . Chronic venous insufficiency   . Gout   . Hyperlipidemia   . Obesity (BMI 30-39.9)   . Glaucoma   . Gout   . Acute coronary syndrome ([place]) 02/29/2012  . Atrial fibrillation ([place]) 02/29/2012    Recurrent paroxysmal, new-onset  . Acute systolic heart failure ([place]) 03/03/2012  . S/P CABG x 3 03/06/2012    LIMA to LAD, SVG to OM, SVG to RCA, EVH via right thigh  . S/P Maze operation for atrial fibrillation 03/06/2012    Complete bilateral lesions set using bipolar radiofrequency and cryothermy ablation with clipping of LA appendage  . Ascending aorta dilatation (HCC) 03/13/2012    Fusiform dilatation of the ascending thoracic aorta discovered during surgery, max diameter 4.2-4.3 cm  . Kidney stones    Past Surgical History  Procedure Laterality Date  . Cardiac catheterization    . Prostatectomy    . Cataract extraction, bilateral    . Coronary artery bypass graft  N/A 03/06/2012    Procedure: CORONARY ARTERY BYPASS GRAFTING (CABG);  Surgeon: Rexene Alberts, MD;  Location: [place];  Service: Open Heart Surgery;  Laterality: N/A;  . Maze N/A 03/06/2012    Procedure: MAZE;  Surgeon: Rexene Alberts, MD;  Location: [place];  Service: Open Heart Surgery;  Laterality: N/A;  . Intraoperative transesophageal echocardiogram N/A 03/06/2012    Procedure: INTRAOPERATIVE TRANSESOPHAGEAL ECHOCARDIOGRAM;  Surgeon: Rexene Alberts, MD;  Location: [place];  Service: Open Heart Surgery;  Laterality: N/A;  . Endovein harvest of greater saphenous vein Right 03/06/2012    Procedure: ENDOVEIN HARVEST OF GREATER SAPHENOUS VEIN;  Surgeon: Rexene Alberts, MD;  Location: [place];  Service: Open Heart Surgery;  Laterality: Right;  . Cholecystectomy N/A 03/24/2012    Procedure: LAPAROSCOPIC CHOLECYSTECTOMY WITH INTRAOPERATIVE CHOLANGIOGRAM;  Surgeon: Zenovia Jarred, MD;  Location: [place];  Service: General;  Laterality: N/A;  . Left heart cath Right 03/02/2012    Procedure: LEFT HEART CATH;  Surgeon: Sherren Mocha, MD;  Location: [facility] CATH LAB;  Service: Cardiovascular;  Laterality: Right;   Family History  Problem Relation Age of Onset  . Dementia Mother   . Stroke Brother    Social History  Substance Use Topics  . Smoking status: Former Research scientist (life sciences)  . Smokeless tobacco: Never Used  . Alcohol Use: 0.6  oz/week    1 Shots of liquor per week     Comment: weekly      Review of Systems  Constitutional: Positive for diaphoresis. Negative for fever and chills.  HENT: Negative for congestion.   Respiratory: Positive for shortness of breath. Negative for cough.   Cardiovascular: Positive for chest pain.  Gastrointestinal: Negative for nausea, vomiting, abdominal pain, diarrhea and constipation.  Neurological: Positive for dizziness and light-headedness. Negative for weakness, numbness and headaches.      Allergies  Bee venom  Home Medications   Prior to Admission medications   Medication  Sig Start Date End Date Taking? Authorizing Provider  allopurinol (ZYLOPRIM) 300 MG tablet Take 150 mg by mouth Daily. 02/26/11   Historical Provider, MD  aspirin 81 MG tablet Take 81 mg by mouth daily.    Historical Provider, MD  atenolol (TENORMIN) 50 MG tablet Take 25 mg by mouth daily. 09/07/14   Historical Provider, MD  cholecalciferol (VITAMIN D) 1000 UNITS tablet Take 1,000 Units by mouth daily.    Historical Provider, MD  DiphenhydrAMINE HCl (BENADRYL ALLERGY PO) Take 25 mg by mouth as needed.     Historical Provider, MD  EPINEPHrine (EPIPEN 2-PAK) 0.3 mg/0.3 mL IJ SOAJ injection Inject 0.3 mLs (0.3 mg total) into the muscle once. [place] [id number] EXP 10/17 09/30/14   Jiles Prows, MD  fish oil-omega-3 fatty acids 1000 MG capsule Take 1 g by mouth daily.     Historical Provider, MD  furosemide (LASIX) 40 MG tablet Take 20 mg by mouth daily. 03/25/12   Jacolyn Reedy, MD  galantamine (RAZADYNE ER) 8 MG 24 hr capsule Take 8 mg by mouth Daily. 02/26/11   Historical Provider, MD  HONEY BEE VENOM IJ Inject as directed.    Historical Provider, MD  NITROSTAT 0.4 MG SL tablet Place 1 tablet under the tongue every 5 (five) minutes x 3 doses as needed for chest pain.  04/01/11   Historical Provider, MD  rivaroxaban (XARELTO) 15 MG TABS tablet Take 1 tablet (15 mg total) by mouth daily with supper. 03/11/12   Erin R Barrett, PA-C  rosuvastatin (CRESTOR) 20 MG tablet Take 10 mg by mouth daily.    Historical Provider, MD  triamcinolone cream (KENALOG) 0.1 % Apply 1 application topically as needed (dry skin irritation).     Historical Provider, MD  vitamin B-12 (CYANOCOBALAMIN) 1000 MCG tablet Take 1,000 mcg by mouth daily.     Historical Provider, MD  ZETIA 10 MG tablet Take 10 mg by mouth Daily. 02/26/11   Historical Provider, MD    BP 129/49 mmHg  Pulse 54  Temp(Src) 97.4 F (36.3 C) (Oral)  Resp 18  Ht 6' 1.5" (1.867 m)  Wt 240 lb (108.863 kg)  BMI 31.23 kg/m2  SpO2 100% Physical  Exam  Constitutional: He is oriented to person, place, and time. He appears well-developed and well-nourished. No distress.  HENT:  Head: Normocephalic and atraumatic.  Right Ear: External ear normal.  Left Ear: External ear normal.  Nose: Nose normal.  Mouth/Throat: Uvula is midline, oropharynx is clear and moist and mucous membranes are normal.  Eyes: Conjunctivae, EOM and lids are normal. Pupils are equal, round, and reactive to light. Right eye exhibits no discharge. Left eye exhibits no discharge. No scleral icterus.  Neck: Normal range of motion. Neck supple.  Cardiovascular: Normal rate, regular rhythm, normal heart sounds, intact distal pulses and normal pulses.   Pulmonary/Chest: Effort normal and breath sounds  normal. No respiratory distress. He has no wheezes. He has no rales. He exhibits tenderness.  Left chest wall tender to palpation.  Abdominal: Soft. Normal appearance and bowel sounds are normal. He exhibits no distension and no mass. There is no tenderness. There is no rigidity, no rebound and no guarding.  Musculoskeletal: Normal range of motion. He exhibits no edema or tenderness.  Neurological: He is alert and oriented to person, place, and time.  Skin: Skin is warm, dry and intact. No rash noted. He is not diaphoretic. No erythema. No pallor.  Psychiatric: He has a normal mood and affect. His speech is normal and behavior is normal.  Nursing note and vitals reviewed.   ED Course  Procedures (including critical care time)  Labs Review Labs Reviewed  BASIC METABOLIC PANEL - Abnormal; Notable for the following:    Glucose, Bld 147 (*)    BUN 22 (*)    GFR calc non Af Amer 52 (*)    All other components within normal limits  CBC - Abnormal; Notable for the following:    RBC 4.04 (*)    Hemoglobin 12.2 (*)    HCT 37.1 (*)    All other components within normal limits  BRAIN NATRIURETIC PEPTIDE - Abnormal; Notable for the following:    B Natriuretic Peptide 196.3  (*)    All other components within normal limits  Randolm Idol, ED    Imaging Review Dg Chest 2 View  10/24/2014  CLINICAL DATA:  81 year old male with acute left-sided chest pain today. EXAM: CHEST  2 VIEW COMPARISON:  06/23/2013 and prior exams FINDINGS: Cardiomegaly, CABG changes and left atrial appendage exclusion device again noted. There is no evidence of focal airspace disease, pulmonary edema, suspicious pulmonary nodule/mass, pleural effusion, or pneumothorax. No acute bony abnormalities are identified. IMPRESSION: Cardiomegaly without evidence of acute cardiopulmonary disease. Electronically Signed   By: Margarette Canada M.D.   On: 10/24/2014 14:42     I have personally reviewed and evaluated these images and lab results as part of my medical decision-making.   EKG Interpretation None      MDM   Final diagnoses:  Chest pain, unspecified chest pain type    81 year old male presents with chest pain, shortness of breath, diaphoresis, dizziness, which he states started this morning while sitting at church. Denies fever, chills, cough, congestion, abdominal pain, N/V/D/C, weakness, numbness, paresthesia. Patient is afebrile. Vital signs stable. Heart regular rate and rhythm. Lungs clear to auscultation bilaterally. Tenderness to palpation of left anterior chest wall. Abdomen soft, nontender, nondistended. No lower extremity edema.  EKG with no significant change from prior. Troponin negative. CBC negative for leukocytosis, hemoglobin 12. BMP unremarkable. BNP 196. Chest x-ray reveals cardiomegaly without evidence of acute cardiopulmonary disease.  Patient given morphine for pain, with subsequent drop in blood pressure to 0000000 systolic. Given 250 mL bolus normal saline. Given tylenol for pain. BP stabilized to [id number] systolic. Hospitalist consulted for admission given patient's significant cardiac history. Spoke with hospitalist, who will admit for further evaluation and  management.  BP 142/59 mmHg  Pulse 54  Temp(Src) 97.4 F (36.3 C) (Oral)  Resp 19  Ht 6' 1.5" (1.867 m)  Wt 240 lb (108.863 kg)  BMI 31.23 kg/m2  SpO2 98%   Marella Chimes, PA-C 10/24/14 1556  Leo Grosser, MD 10/25/14 [phone]

## 2014-10-25 ENCOUNTER — Encounter (HOSPITAL_COMMUNITY): Payer: Self-pay | Admitting: Radiology

## 2014-10-25 ENCOUNTER — Observation Stay (HOSPITAL_COMMUNITY): Payer: Medicare Other

## 2014-10-25 DIAGNOSIS — Z951 Presence of aortocoronary bypass graft: Secondary | ICD-10-CM

## 2014-10-25 DIAGNOSIS — I4891 Unspecified atrial fibrillation: Secondary | ICD-10-CM

## 2014-10-25 DIAGNOSIS — E669 Obesity, unspecified: Secondary | ICD-10-CM | POA: Diagnosis not present

## 2014-10-25 DIAGNOSIS — I25810 Atherosclerosis of coronary artery bypass graft(s) without angina pectoris: Secondary | ICD-10-CM | POA: Diagnosis not present

## 2014-10-25 DIAGNOSIS — R0789 Other chest pain: Secondary | ICD-10-CM

## 2014-10-25 DIAGNOSIS — I712 Thoracic aortic aneurysm, without rupture: Secondary | ICD-10-CM

## 2014-10-25 LAB — TROPONIN I

## 2014-10-25 MED ORDER — TRAMADOL HCL 50 MG PO TABS: 50.0000 mg | ORAL_TABLET | Freq: Four times a day (QID) | ORAL | Status: DC | PRN

## 2014-10-25 MED ORDER — IOHEXOL 350 MG/ML SOLN
80.0000 mL | Freq: Once | INTRAVENOUS | Status: AC | PRN
  Administered 2014-10-25: 80 mL via INTRAVENOUS

## 2014-10-25 MED ORDER — METHOCARBAMOL 500 MG PO TABS
500.0000 mg | ORAL_TABLET | Freq: Three times a day (TID) | ORAL | Status: DC | PRN
Start: 2014-10-25 — End: 2015-04-14

## 2014-10-25 MED ORDER — IOHEXOL 350 MG/ML SOLN
100.0000 mL | Freq: Once | INTRAVENOUS | Status: AC | PRN
  Administered 2014-10-25: 75 mL via INTRAVENOUS

## 2014-10-25 NOTE — Evaluation (Signed)
Physical Therapy Evaluation and discharge Patient Details Name: [name] [name] MRN: [id number] DOB: 17-May-1926 Today's Date: 10/25/2014   History of Present Illness  [name] [name] is a 81 y.o. male, with CAD s/p CABG and 2 cardiac arrests, OSA, afib on rivaroxaban, and an ascending thoracic aneurysm presented to the ER with left sided chest pain.  Clinical Impression  Pt moving at MOD I level with bed mobility, transfers, gait, and stairs. Discussed with pt pacing self at home.  HR 65-76 bpm during session.   No skilled PT intervention needed and will d/c from PT at this time.     Follow Up Recommendations No PT follow up    Equipment Recommendations  None recommended by PT    Recommendations for Other Services       Precautions / Restrictions Precautions Precautions: None      Mobility  Bed Mobility Overal bed mobility: Modified Independent             General bed mobility comments: with rail  Transfers Overall transfer level: Modified independent                  Ambulation/Gait Ambulation/Gait assistance: Modified independent (Device/Increase time) Ambulation Distance (Feet): 400 Feet Assistive device: None Gait Pattern/deviations: Step-through pattern     General Gait Details: Pt with no LOB noted and able to manage obstacles with no difficulty. Able to open stairwell door.  Stairs Stairs: Yes Stairs assistance: Modified independent (Device/Increase time) Stair Management: Alternating pattern;One rail Right;Forwards Number of Stairs: 5 General stair comments: Step through pattern with stairs  Wheelchair Mobility    Modified Rankin (Stroke Patients Only)       Balance Overall balance assessment: No apparent balance deficits (not formally assessed)                                           Pertinent Vitals/Pain Pain Assessment: 0-10 Pain Score: 3  Pain Location: L chest Pain Intervention(s): Premedicated before  session;Monitored during session;Limited activity within patient's tolerance;Other (comment) (Pt received muscle relaxer during session and pain decreased to 1/10.)  HR 65-76    Home Living Family/patient expects to be discharged to:: Private residence Living Arrangements: Spouse/significant other   Type of Home: House       Home Layout: Two level;Full bath on main level Home Equipment: None      Prior Function Level of Independence: Independent         Comments: avid woodworker, keeps busy with house projects.     Hand Dominance        Extremity/Trunk Assessment   Upper Extremity Assessment: Overall WFL for tasks assessed           Lower Extremity Assessment: Overall WFL for tasks assessed         Communication   Communication: No difficulties  Cognition Arousal/Alertness: Awake/alert Behavior During Therapy: WFL for tasks assessed/performed Overall Cognitive Status: Within Functional Limits for tasks assessed                      General Comments      Exercises        Assessment/Plan    PT Assessment Patent does not need any further PT services  PT Diagnosis Difficulty walking   PT Problem List    PT Treatment Interventions     PT Goals (Current goals can  be found in the Care Plan section) Acute Rehab PT Goals Patient Stated Goal: figure out what is causing the pain PT Goal Formulation: All assessment and education complete, DC therapy    Frequency     Barriers to discharge        Co-evaluation               End of Session Equipment Utilized During Treatment: Gait belt Activity Tolerance: Patient tolerated treatment well Patient left: in chair;with family/visitor present;with call bell/phone within reach Nurse Communication: Mobility status    Functional Assessment Tool Used: clinical judgement and objective findings Functional Limitation: Mobility: Walking and moving around Mobility: Walking and Moving Around  Current Status [phone]): 0 percent impaired, limited or restricted Mobility: Walking and Moving Around Goal Status [id number]): 0 percent impaired, limited or restricted Mobility: Walking and Moving Around Discharge Status [phone]): 0 percent impaired, limited or restricted    Time: [id number] (no charge for 13 mins while PT stepped out and pt ate lunch)-1333 PT Time Calculation (min) (ACUTE ONLY): 34 min   Charges:   PT Evaluation $Initial PT Evaluation Tier I: 1 Procedure     PT G Codes:   PT G-Codes **NOT FOR INPATIENT CLASS** Functional Assessment Tool Used: clinical judgement and objective findings Functional Limitation: Mobility: Walking and moving around Mobility: Walking and Moving Around Current Status [id number]): 0 percent impaired, limited or restricted Mobility: Walking and Moving Around Goal Status [id number]): 0 percent impaired, limited or restricted Mobility: Walking and Moving Around Discharge Status [id number]): 0 percent impaired, limited or restricted    [name] [name] 10/25/2014, 2:14 PM

## 2014-10-25 NOTE — Discharge Instructions (Signed)
Follow with PERINI,MARK A, MD in 2 weeks  Please get a complete blood count and chemistry panel checked by your Primary MD at your next visit, and again as instructed by your Primary MD. Please get your medications reviewed and adjusted by your Primary MD.  Please request your Primary MD to go over all Hospital Tests and Procedure/Radiological results at the follow up, please get all Hospital records sent to your Prim MD by signing hospital release before you go home.  If you had Pneumonia of Lung problems at the Hospital: Please get a 2 view Chest X ray done in 6-8 weeks after hospital discharge or sooner if instructed by your Primary MD.  If you have Congestive Heart Failure: Please call your Cardiologist or Primary MD anytime you have any of the following symptoms:  1) 3 pound weight gain in 24 hours or 5 pounds in 1 week  2) shortness of breath, with or without a dry hacking cough  3) swelling in the hands, feet or stomach  4) if you have to sleep on extra pillows at night in order to breathe  Follow cardiac low salt diet and 1.5 lit/day fluid restriction.  If you have diabetes Accuchecks 4 times/day, Once in AM empty stomach and then before each meal. Log in all results and show them to your primary doctor at your next visit. If any glucose reading is under 80 or above 300 call your primary MD immediately.  If you have Seizure/Convulsions/Epilepsy: Please do not drive, operate heavy machinery, participate in activities at heights or participate in high speed sports until you have seen by Primary MD or a Neurologist and advised to do so again.  If you had Gastrointestinal Bleeding: Please ask your Primary MD to check a complete blood count within one week of discharge or at your next visit. Your endoscopic/colonoscopic biopsies that are pending at the time of discharge, will also need to followed by your Primary MD.  Get Medicines reviewed and adjusted. Please take all your  medications with you for your next visit with your Primary MD  Please request your Primary MD to go over all hospital tests and procedure/radiological results at the follow up, please ask your Primary MD to get all Hospital records sent to his/her office.  If you experience worsening of your admission symptoms, develop shortness of breath, life threatening emergency, suicidal or homicidal thoughts you must seek medical attention immediately by calling 911 or calling your MD immediately  if symptoms less severe.  You must read complete instructions/literature along with all the possible adverse reactions/side effects for all the Medicines you take and that have been prescribed to you. Take any new Medicines after you have completely understood and accpet all the possible adverse reactions/side effects.   Do not drive or operate heavy machinery when taking Pain medications.   Do not take more than prescribed Pain, Sleep and Anxiety Medications  Special Instructions: If you have smoked or chewed Tobacco  in the last 2 yrs please stop smoking, stop any regular Alcohol  and or any Recreational drug use.  Wear Seat belts while driving.  Please note You were cared for by a hospitalist during your hospital stay. If you have any questions about your discharge medications or the care you received while you were in the hospital after you are discharged, you can call the unit and asked to speak with the hospitalist on call if the hospitalist that took care of you is not available. Once  you are discharged, your primary care physician will handle any further medical issues. Please note that NO REFILLS for any discharge medications will be authorized once you are discharged, as it is imperative that you return to your primary care physician (or establish a relationship with a primary care physician if you do not have one) for your aftercare needs so that they can reassess your need for medications and monitor your  lab values.  You can reach the hospitalist office at phone [phone] or fax [phone]   If you do not have a primary care physician, you can call [phone] for a physician referral.  Activity: As tolerated with Full fall precautions use walker/cane & assistance as needed  Diet: heart healthy  Disposition Home

## 2014-10-25 NOTE — Discharge Summary (Signed)
Physician Discharge Summary  [name] [name] [id number] DOB: 30-Sep-1926 DOA: 10/24/2014  PCP: [name], MD  Admit date: 10/24/2014 Discharge date: 10/25/2014  Time spent: > 30 minutes  Recommendations for Outpatient Follow-up:  1. Follow up with Dr. Joylene Draft in 1-2 weeks 2. Follow up with Dr. Wynonia Lawman as needed   Discharge Diagnoses:  Principal Problem:   Atypical chest pain Active Problems:   CAD (coronary artery disease)   Hyperlipidemia   Obesity (BMI 30-39.9)   Hypertension   Atrial fibrillation (HCC)   S/P CABG x 3   Thoracic ascending aortic aneurysm [name])  Discharge Condition: stable  Diet recommendation: heart healthy  Filed Weights   10/24/14 1324 10/24/14 1700 10/25/14 0450  Weight: 108.863 kg (240 lb) 109.135 kg (240 lb 9.6 oz) 109.1 kg (240 lb 8.4 oz)    History of present illness:  See H&P, Labs, Consult and Test reports for all details in brief, patient is a 81 yo M with CAD s/p CABG, A fib on rivaroxaban, and an ascending thoracic aneurysm who presented to the ER with atypical chest pain.  Hospital Course:  Chest pain - given cardiac history and at family request, consulted cardiology, Dr Wynonia Lawman evaluated patient. On admission patient had large difference in BP in arms, 136/79 on right, 165/73 on left, therefore a CT Angio ordered for evaluation of his known thoracic aneurysm. His cardiac enzymes have remained negative and patient had a 2D echo which showed normal EF, and no WMA. CAD - as above Thoracic aortic aneurysm- CT Aorta ordered, unfortunately radiology had a PE study done instead, and test needed to be repeated. I have discussed with radiology department, patient informed about this, and radiology staff has discussed directly with the patient. His renal function is stable. Counseled for hydration.  A fib - rate controlled, continue anticoagulation Chronic Systolic CHF - compensated, echo as below with improved EF HLD - Continue statin and  zetia. Bradycardia - asymptomatic Alcohol dependence - Mild. Has two cocktails daily before dinner.    Procedures:  2D echo Study Conclusions - Left ventricle: The cavity size was normal. Wall thickness wasincreased in a pattern of moderate LVH. Systolic function wasnormal. The estimated ejection fraction was in the range of 60%to 65%. Wall motion was normal; there were no regional wallmotion abnormalities. - Aortic valve: Valve mobility was mildly restricted. There wasmild to moderate stenosis. There was mild to moderateregurgitation. Valve area (VTI): 1.28 cm^2. Valve area (Vmax):1.16 cm^2. Valve area (Vmean): 1.18 cm^2. - Left atrium: The atrium was moderately dilated. - Right ventricle: The cavity size was mildly dilated. Wallthickness was normal. - Right atrium: The atrium was mildly to moderately dilated. - Pulmonary arteries: Systolic pressure was moderately increased.PA peak pressure: 47 mm Hg (S).   Consultations:  Cardiology   Discharge Exam: Filed Vitals:   10/25/14 0739 10/25/14 0800 10/25/14 1121 10/25/14 1607  BP: 136/79 165/73 155/61 168/62  Pulse: 53   50  Temp: 98 F (36.7 C)  98.4 F (36.9 C) 98.1 F (36.7 C)  TempSrc: Oral  Oral Oral  Resp:      Height:      Weight:      SpO2: 98% 97% 98% 97%   General: NAD Cardiovascular: RRR Respiratory: CTA biL  Discharge Instructions Activity:  As tolerated   Get Medicines reviewed and adjusted: Please take all your medications with you for your next visit with your Primary MD  Please request your Primary MD to go over all hospital tests  and procedure/radiological results at the follow up, please ask your Primary MD to get all Hospital records sent to his/her office.  If you experience worsening of your admission symptoms, develop shortness of breath, life threatening emergency, suicidal or homicidal thoughts you must seek medical attention immediately by calling 911 or calling your MD immediately if  symptoms less severe.  You must read complete instructions/literature along with all the possible adverse reactions/side effects for all the Medicines you take and that have been prescribed to you. Take any new Medicines after you have completely understood and accpet all the possible adverse reactions/side effects.   Do not drive when taking Pain medications.   Do not take more than prescribed Pain, Sleep and Anxiety Medications  Special Instructions: If you have smoked or chewed Tobacco in the last 2 yrs please stop smoking, stop any regular Alcohol and or any Recreational drug use.  Wear Seat belts while driving.  Please note  You were cared for by a hospitalist during your hospital stay. Once you are discharged, your primary care physician will handle any further medical issues. Please note that NO REFILLS for any discharge medications will be authorized once you are discharged, as it is imperative that you return to your primary care physician (or establish a relationship with a primary care physician if you do not have one) for your aftercare needs so that they can reassess your need for medications and monitor your lab values.    Medication List    TAKE these medications        allopurinol 300 MG tablet  Commonly known as:  ZYLOPRIM  Take 150 mg by mouth Daily.     aspirin 81 MG tablet  Take 81 mg by mouth daily.     atenolol 50 MG tablet  Commonly known as:  TENORMIN  Take 25 mg by mouth daily.     BENADRYL ALLERGY PO  Take 25 mg by mouth as needed.     cholecalciferol 1000 UNITS tablet  Commonly known as:  VITAMIN D  Take 1,000 Units by mouth daily.     EPINEPHrine 0.3 mg/0.3 mL Soaj injection  Commonly known as:  EPIPEN 2-PAK  Inject 0.3 mLs (0.3 mg total) into the muscle once. [place] [id number] LOT [id number] EXP 10/17     fish oil-omega-3 fatty acids 1000 MG capsule  Take 1 g by mouth daily.     furosemide 40 MG tablet  Commonly known as:  LASIX  Take 20 mg by  mouth daily.     galantamine 8 MG 24 hr capsule  Commonly known as:  RAZADYNE ER  Take 8 mg by mouth Daily.     HONEY BEE VENOM IJ  Inject as directed.     methocarbamol 500 MG tablet  Commonly known as:  ROBAXIN  Take 1 tablet (500 mg total) by mouth every 8 (eight) hours as needed for muscle spasms.     NITROSTAT 0.4 MG SL tablet  Generic drug:  nitroGLYCERIN  Place 1 tablet under the tongue every 5 (five) minutes x 3 doses as needed for chest pain.     psyllium 58.6 % powder  Commonly known as:  METAMUCIL  Take 1 packet by mouth daily.     Rivaroxaban 15 MG Tabs tablet  Commonly known as:  XARELTO  Take 1 tablet (15 mg total) by mouth daily with supper.     rosuvastatin 20 MG tablet  Commonly known as:  CRESTOR  Take 10 mg by  mouth daily.     traMADol 50 MG tablet  Commonly known as:  ULTRAM  Take 1 tablet (50 mg total) by mouth every 6 (six) hours as needed for moderate pain.     triamcinolone cream 0.1 %  Commonly known as:  KENALOG  Apply 1 application topically as needed (dry skin irritation).     vitamin B-12 1000 MCG tablet  Commonly known as:  CYANOCOBALAMIN  Take 1,000 mcg by mouth daily.     ZETIA 10 MG tablet  Generic drug:  ezetimibe  Take 10 mg by mouth Daily.           Follow-up Information    Follow up with PERINI,MARK A, MD. Schedule an appointment as soon as possible for a visit in 2 weeks.   Specialty:  Internal Medicine   Contact information:   [place] [address] [phone]       The results of significant diagnostics from this hospitalization (including imaging, microbiology, ancillary and laboratory) are listed below for reference.    Significant Diagnostic Studies: Dg Chest 2 View  10/24/2014  CLINICAL DATA:  81 year old male with acute left-sided chest pain today. EXAM: CHEST  2 VIEW COMPARISON:  06/23/2013 and prior exams FINDINGS: Cardiomegaly, CABG changes and left atrial appendage exclusion device again  noted. There is no evidence of focal airspace disease, pulmonary edema, suspicious pulmonary nodule/mass, pleural effusion, or pneumothorax. No acute bony abnormalities are identified. IMPRESSION: Cardiomegaly without evidence of acute cardiopulmonary disease. Electronically Signed   By: Margarette Canada M.D.   On: 10/24/2014 14:42   Ct Angio Chest Aorta W/cm &/or Wo/cm  10/25/2014  ADDENDUM REPORT: 10/25/2014 15:59 ADDENDUM: Patient returned for further imaging utilizing dissection protocol. Additional 100 mL Omnipaque 350 IV was administered. No evidence of thoracic aortic dissection. Stable 4.4 cm ascending thoracic aortic aneurysm (series 4/image 65). Aorta measures 3.7 cm at the aortic root (series 4/image 82), 2.9 cm at the level of the proximal descending thoracic aorta (series 4/image 36), and 2.8 cm at the aortic hiatus (series 4/image 142). Study is otherwise unchanged from original CT. Electronically Signed   By: Julian Hy M.D.   On: 10/25/2014 15:59  10/25/2014  CLINICAL DATA:  Left chest pain, hypertension EXAM: CT ANGIOGRAPHY CHEST WITH CONTRAST TECHNIQUE: Multidetector CT imaging of the chest was performed using the standard protocol during bolus administration of intravenous contrast. Multiplanar CT image reconstructions and MIPs were obtained to evaluate the vascular anatomy. CONTRAST:  73mL OMNIPAQUE IOHEXOL 350 MG/ML SOLN COMPARISON:  Chest radiograph dated 10/24/2014. CTA chest dated 04/05/2014. FINDINGS: No evidence of pulmonary embolism. 4.5 cm ascending thoracic aortic aneurysm (series 2/image 73), unchanged. Mediastinum/Nodes: Mild cardiomegaly.  No pericardial effusion. Coronary atherosclerosis. Postsurgical changes related to prior CABG. Atherosclerotic calcifications aortic arch. Small mediastinal lymph nodes, including a 12 mm short axis subcarinal node (series 2/image 33), likely reactive. Visualized thyroid is unremarkable. Lungs/Pleura: No suspicious pulmonary nodules. No  frank interstitial edema. Minimal dependent atelectasis in the bilateral lower lobes. Trace rolled fluoro bilaterally. No focal consolidation. No pleural effusion or pneumothorax. Upper abdomen: Visualized upper abdomen is notable for cholecystectomy clips, vascular calcifications, and a 2.9 cm medial right upper pole cyst. Musculoskeletal: Degenerative changes of the visualized thoracolumbar spine. Median sternotomy. Review of the MIP images confirms the above findings. IMPRESSION: No evidence of pulmonary embolism. 4.5 cm ascending thoracic aortic aneurysm, unchanged. No evidence of acute cardiopulmonary disease. Electronically Signed: By: Julian Hy M.D. On: 10/25/2014 13:09   Labs:  Basic Metabolic Panel:  Recent Labs Lab 10/24/14 1335  NA 137  K 4.3  CL 103  CO2 26  GLUCOSE 147*  BUN 22*  CREATININE 1.21  CALCIUM 9.7   CBC:  Recent Labs Lab 10/24/14 1335  WBC 4.5  HGB 12.2*  HCT 37.1*  MCV 91.8  PLT 171   Cardiac Enzymes:  Recent Labs Lab 10/24/14 1820 10/24/14 2258 10/25/14 0435  TROPONINI <0.03 <0.03 <0.03   BNP: BNP (last 3 results)  Recent Labs  10/24/14 1335  BNP 196.3*    Signed:  GHERGHE, COSTIN  [facility] 10/25/2014, 6:19 PM

## 2014-10-25 NOTE — Consult Note (Addendum)
Cardiology Consult Note  Admit date: 10/24/2014 Name: [name] [name] 81 y.o.  male DOB:  12/04/1926 MRN:  [id number]  Today's date:  10/25/2014  Referring Physician:    [facility]  Primary Physician:    Dr. Joylene Draft  Reason for Consultation:    Chest pain   IMPRESSIONS: 1.  Atypical ongoing left-sided chest pain-the pain is atypical for angina and different than his previous pain that he had.  He has had similar episodes of the discomfort in the past and the pain is not associated with difficulties in his shoulder.  Severe pain like this can sometimes be seen with shingles, although he has had a shingles vaccine in the past.  Enzymes have been negative and EKG is unremarkable 2.  Paroxysmal atrial fibrillation currently in atrial fibrillation 3.  Fusiform dilation of thoracic aorta 4.  Hypertension currently controlled 5.  Chronic venous insufficiency 6.  Obesity  RECOMMENDATION: The pain is atypical for myocardial ischemia.  He does have a mild dilation of his thoracic aorta and the pain is somewhat atypical for a dissection also.  Nonetheless I would do a CT scan to be sure he has not had an aortic dissection and also do an echocardiogram to look for pericardial effusion or fluid.  Continue to treat with anti-inflammatories.  Don't see a role for stress testing here  HISTORY: This 81 year old male had bypass grafting and a Maze procedure in 2014.  He has done fairly well since but will occasionally have atypical pain.  He also has mild dilation of his thoracic aneurysm.  He was in church yesterday and had the onset of left-sided chest discomfort while sitting described as a dull pain with sharp components.  He took a nitroglycerin which she says diminish the pain somewhat but did not relieve it.  After church she went outside and sat in the car while his wife went to church meeting and then he had recurrence of pain which was more severe and sharp and made him pale and he sweated  somewhat.  EMS was called and he was transported here and was admitted.  EKG showed atrial fibrillation with slow response but no acute ischemic changes and chest x-ray was unremarkable.  Serial troponins have been negative.  He was treated with morphine which caused some hypotension and he has continued to have discomfort since admission.  He has not had a rash and has not done any unusual physical activity or anything else.  He has known sleep apnea and he also has some mild memory loss at times.  Past Medical History  Diagnosis Date  . Hypertension   . Angina   . Coronary artery disease   . CAD (coronary artery disease)     Cath April 2012 40% left main, 50% LAD, 40% OM, occluded RCA with L-R collaterals   . Hypertension   . BPH (benign prostatic hypertrophy)   . Chronic venous insufficiency   . Gout   . Hyperlipidemia   . Obesity (BMI 30-39.9)   . Glaucoma   . Gout   . Acute coronary syndrome ([place]) 02/29/2012  . Atrial fibrillation ([place]) 02/29/2012    Recurrent paroxysmal, new-onset  . Acute systolic heart failure ([place]) 03/03/2012  . S/P CABG x 3 03/06/2012    LIMA to LAD, SVG to OM, SVG to RCA, EVH via right thigh  . S/P Maze operation for atrial fibrillation 03/06/2012    Complete bilateral lesions set using bipolar radiofrequency and cryothermy ablation with clipping of  LA appendage  . Ascending aorta dilatation (HCC) 03/13/2012    Fusiform dilatation of the ascending thoracic aorta discovered during surgery, max diameter 4.2-4.3 cm  . Kidney stones       Past Surgical History  Procedure Laterality Date  . Cardiac catheterization    . Prostatectomy    . Cataract extraction, bilateral    . Coronary artery bypass graft N/A 03/06/2012    Procedure: CORONARY ARTERY BYPASS GRAFTING (CABG);  Surgeon: Rexene Alberts, MD;  Location: [facility];  Service: Open Heart Surgery;  Laterality: N/A;  . Maze N/A 03/06/2012    Procedure: MAZE;  Surgeon: Rexene Alberts, MD;  Location: [facility];  Service: Open  Heart Surgery;  Laterality: N/A;  . Intraoperative transesophageal echocardiogram N/A 03/06/2012    Procedure: INTRAOPERATIVE TRANSESOPHAGEAL ECHOCARDIOGRAM;  Surgeon: Rexene Alberts, MD;  Location: [facility];  Service: Open Heart Surgery;  Laterality: N/A;  . Endovein harvest of greater saphenous vein Right 03/06/2012    Procedure: ENDOVEIN HARVEST OF GREATER SAPHENOUS VEIN;  Surgeon: Rexene Alberts, MD;  Location: [facility];  Service: Open Heart Surgery;  Laterality: Right;  . Cholecystectomy N/A 03/24/2012    Procedure: LAPAROSCOPIC CHOLECYSTECTOMY WITH INTRAOPERATIVE CHOLANGIOGRAM;  Surgeon: Zenovia Jarred, MD;  Location: [facility];  Service: General;  Laterality: N/A;  . Left heart cath Right 03/02/2012    Procedure: LEFT HEART CATH;  Surgeon: Sherren Mocha, MD;  Location: [facility] CATH LAB;  Service: Cardiovascular;  Laterality: Right;     Allergies:  is allergic to bee venom and morphine and related.   Medications: Prior to Admission medications   Medication Sig Start Date End Date Taking? Authorizing Provider  allopurinol (ZYLOPRIM) 300 MG tablet Take 150 mg by mouth Daily. 02/26/11  Yes Historical Provider, MD  aspirin 325 MG tablet Take 650 mg by mouth once.   Yes Historical Provider, MD  aspirin 81 MG tablet Take 81 mg by mouth daily.   Yes Historical Provider, MD  atenolol (TENORMIN) 50 MG tablet Take 25 mg by mouth daily. 09/07/14  Yes Historical Provider, MD  cholecalciferol (VITAMIN D) 1000 UNITS tablet Take 1,000 Units by mouth daily.   Yes Historical Provider, MD  DiphenhydrAMINE HCl (BENADRYL ALLERGY PO) Take 25 mg by mouth as needed.    Yes Historical Provider, MD  EPINEPHrine (EPIPEN 2-PAK) 0.3 mg/0.3 mL IJ SOAJ injection Inject 0.3 mLs (0.3 mg total) into the muscle once. [place] [id number] EXP 10/17 09/30/14  Yes Jiles Prows, MD  fish oil-omega-3 fatty acids 1000 MG capsule Take 1 g by mouth daily.    Yes Historical Provider, MD  furosemide (LASIX) 40 MG tablet Take 20 mg by mouth  daily. 03/25/12  Yes Jacolyn Reedy, MD  galantamine (RAZADYNE ER) 8 MG 24 hr capsule Take 8 mg by mouth Daily. 02/26/11  Yes Historical Provider, MD  HONEY BEE VENOM IJ Inject as directed.   Yes Historical Provider, MD  NITROSTAT 0.4 MG SL tablet Place 1 tablet under the tongue every 5 (five) minutes x 3 doses as needed for chest pain.  04/01/11  Yes Historical Provider, MD  psyllium (METAMUCIL) 58.6 % powder Take 1 packet by mouth daily.   Yes Historical Provider, MD  rivaroxaban (XARELTO) 15 MG TABS tablet Take 1 tablet (15 mg total) by mouth daily with supper. 03/11/12  Yes Erin R Barrett, PA-C  rosuvastatin (CRESTOR) 20 MG tablet Take 10 mg by mouth daily.   Yes Historical Provider, MD  triamcinolone  cream (KENALOG) 0.1 % Apply 1 application topically as needed (dry skin irritation).    Yes Historical Provider, MD  vitamin B-12 (CYANOCOBALAMIN) 1000 MCG tablet Take 1,000 mcg by mouth daily.    Yes Historical Provider, MD  ZETIA 10 MG tablet Take 10 mg by mouth Daily. 02/26/11  Yes Historical Provider, MD    Family History: Family Status  Relation Status Death Age  . Mother Deceased   . Father Deceased   . Brother Deceased     Social History:   reports that he has quit smoking. He has never used smokeless tobacco. He reports that he drinks about 0.6 oz of alcohol per week.   Social History   Social History Narrative   Lives with wife in [place].     Very active physically without significant limitations.    Exercises regularly    Review of Systems:   Physical Exam: BP 165/73 mmHg  Pulse 53  Temp(Src) 98 F (36.7 C) (Oral)  Resp 18  Ht 6' 1.5" (1.867 m)  Wt 109.1 kg (240 lb 8.4 oz)  BMI 31.30 kg/m2  SpO2 97%  General appearance: Pleasant mildly obese white male currently in no acute distress complaining of mild left-sided chest discomfort. Head: Normocephalic, without obvious abnormality, atraumatic, Balding male hair pattern Neck: no adenopathy, no carotid bruit, no  JVD and supple, symmetrical, trachea midline Lungs: clear to auscultation bilaterally and Healed median sternotomy scar Chest wall: left sided chest wall tenderness Heart: Mildly irregular rhythm, normal S1 and S2, no S3, 2/6 systolic murmur of aortic stenosis and aortic valve radiating somewhat to shoulders Abdomen: soft, non-tender; bowel sounds normal; no masses,  no organomegaly Rectal: deferred Extremities: extremities normal, atraumatic, no cyanosis or edema and Healed scar saphenous vein grafting Pulses: 2+ and symmetric Skin: Skin color, texture, turgor normal. No rashes or lesions Neurologic: Grossly normal  Labs: CBC  Recent Labs  10/24/14 1335  WBC 4.5  RBC 4.04*  HGB 12.2*  HCT 37.1*  PLT 171  MCV 91.8  MCH 30.2  MCHC 32.9  RDW 13.7   CMP   Recent Labs  10/24/14 1335  NA 137  K 4.3  CL 103  CO2 26  GLUCOSE 147*  BUN 22*  CREATININE 1.21  CALCIUM 9.7  GFRNONAA 52*  GFRAA >60   BNP (last 3 results)    Component Value Date/Time   BNP 196.3* 10/24/2014 1335   Cardiac Panel (last 3 results) Cardiac Panel (last 3 results)  Recent Labs  10/24/14 1820 10/24/14 2258  TROPONINI <0.03 <0.03     Radiology: Cardiomegaly, no acute disease  EKG: Atrial fibrillation with slow ventricular response, right bundle branch block, no acute ischemic changes  Signed:  W. Doristine Church MD [facility]   Cardiology Consultant  10/25/2014, 8:41 AM

## 2014-10-25 NOTE — Progress Notes (Signed)
PT Cancellation Note  Patient Details Name: [name] [name] MRN: [id number] DOB: 1926-09-29   Cancelled Treatment:    Reason Eval/Treat Not Completed: Patient at procedure or test/unavailable. Nurse informed PT that transport is about to come get pt for CT scan and asked PT to come back later.  Will check back as schedule permits.   [name] 10/25/2014, 11:57 AM

## 2014-10-25 NOTE — Progress Notes (Signed)
  Echocardiogram 2D Echocardiogram has been performed.  [name] [name] 10/25/2014, 3:12 PM

## 2014-10-25 NOTE — Progress Notes (Signed)
Pt discharge and teaching reviewed. VSS. Pt discharging home via wife.

## 2014-10-25 NOTE — Progress Notes (Signed)
Pt has order for CPAP. He does wear at home but stated he does not want to wear tonight. He is currently wearing 2L O2. He understands to call RN/RT if he changes his mind. RT will monitor.

## 2014-10-29 DIAGNOSIS — K2100 Gastro-esophageal reflux disease with esophagitis, without bleeding: Secondary | ICD-10-CM | POA: Insufficient documentation

## 2014-11-18 ENCOUNTER — Encounter: Payer: Self-pay | Admitting: Adult Health

## 2014-11-18 ENCOUNTER — Ambulatory Visit (INDEPENDENT_AMBULATORY_CARE_PROVIDER_SITE_OTHER): Payer: Medicare Other | Admitting: Adult Health

## 2014-11-18 VITALS — BP 118/62 | HR 58 | Temp 98.6°F | Ht 73.0 in | Wt 243.0 lb

## 2014-11-18 DIAGNOSIS — E669 Obesity, unspecified: Secondary | ICD-10-CM

## 2014-11-18 DIAGNOSIS — G4733 Obstructive sleep apnea (adult) (pediatric): Secondary | ICD-10-CM | POA: Insufficient documentation

## 2014-11-18 NOTE — Progress Notes (Signed)
Reviewed and agree with assessment/plan. 

## 2014-11-18 NOTE — Assessment & Plan Note (Signed)
Work on weight loss.

## 2014-11-18 NOTE — Patient Instructions (Addendum)
We will change her C Pap pressure as discussed .  Goal is to wear C Pap at least 6 hours each night. Work on weight loss. Do not drive is sleepy C Pap download in one month. Follow-up with Dr. Halford Chessman  in 4 months and as needed

## 2014-11-18 NOTE — Assessment & Plan Note (Signed)
Moderate to severe sleep apnea Previous C Pap titration showed good control on a set pressure of 12. Download does not show optimal control. We'll adjust pressures back up to 12 as previously recommended. He  is encouraged on compliance  Plan  We will change her C Pap pressure as discussed .  Goal is to wear C Pap at least 6 hours each night. Work on weight loss. Do not drive is sleepy C Pap download in one month. Follow-up with Dr. Halford Chessman  in 4 months and as needed

## 2014-11-18 NOTE — Progress Notes (Signed)
Subjective:    Patient ID: [name] [name], male    DOB: Apr 11, 1926, 81 y.o.   MRN: [id number]  HPI  81 year old male seen for initial sleep consult 09/30/2014 for moderate to severe sleep apnea  TEST  Tests: Echo 03/10/12 >> EF 45 to 50% PSG 07/29/14 >> AHI 29.5, SaO2 low 88%. CPAP 12 >> AHI 0.  11/18/2014 Follow up : OSA  Patient presents for 6 week follow-up Patient was seen last visit for an initial sleep consult. Patient was found to have moderate to severe sleep apnea. A sleep study was done on July 28 that showed an AHI of 29.5, SaO2 to low at 88%. C Pap titration showed good control at 12 cm H2O Download October 13 through November 11 shows 57% usage, average 4 hours on the days that he did use his machine, AHI 16, mild leaks. Patient says that he is doing okay on C Pap at, does not feel that it has made a huge difference since starting.  Patient is supposed to be on a set pressure at [facility] but is on 8 cm H20.  We discussed adjusting his pressure and download in 1 month.  Patient denies any chest pain, orthopnea, PND, or increased leg swelling.   Past Medical History  Diagnosis Date  . Hypertension   . Angina   . Coronary artery disease   . CAD (coronary artery disease)     Cath April 2012 40% left main, 50% LAD, 40% OM, occluded RCA with L-R collaterals   . Hypertension   . BPH (benign prostatic hypertrophy)   . Chronic venous insufficiency   . Gout   . Hyperlipidemia   . Obesity (BMI 30-39.9)   . Glaucoma   . Gout   . Acute coronary syndrome ([place]) 02/29/2012  . Atrial fibrillation ([place]) 02/29/2012    Recurrent paroxysmal, new-onset  . Acute systolic heart failure ([place]) 03/03/2012  . S/P CABG x 3 03/06/2012    LIMA to LAD, SVG to OM, SVG to RCA, EVH via right thigh  . S/P Maze operation for atrial fibrillation 03/06/2012    Complete bilateral lesions set using bipolar radiofrequency and cryothermy ablation with clipping of LA appendage  . Ascending aorta dilatation (HCC)  03/13/2012    Fusiform dilatation of the ascending thoracic aorta discovered during surgery, max diameter 4.2-4.3 cm  . Kidney stones    Current Outpatient Prescriptions on File Prior to Visit  Medication Sig Dispense Refill  . allopurinol (ZYLOPRIM) 300 MG tablet Take 150 mg by mouth Daily.    Marland Kitchen aspirin 81 MG tablet Take 81 mg by mouth daily.    Marland Kitchen atenolol (TENORMIN) 50 MG tablet Take 25 mg by mouth daily.  3  . cholecalciferol (VITAMIN D) 1000 UNITS tablet Take 1,000 Units by mouth daily.    . DiphenhydrAMINE HCl (BENADRYL ALLERGY PO) Take 25 mg by mouth as needed.     Marland Kitchen EPINEPHrine (EPIPEN 2-PAK) 0.3 mg/0.3 mL IJ SOAJ injection Inject 0.3 mLs (0.3 mg total) into the muscle once. [place] [id number] LOT [id number] EXP 10/17 1 Device 0  . fish oil-omega-3 fatty acids 1000 MG capsule Take 1 g by mouth daily.     . furosemide (LASIX) 40 MG tablet Take 20 mg by mouth daily.    Marland Kitchen galantamine (RAZADYNE ER) 8 MG 24 hr capsule Take 8 mg by mouth Daily.    . HONEY BEE VENOM IJ Inject as directed.    Marland Kitchen NITROSTAT 0.4 MG SL tablet Place  1 tablet under the tongue every 5 (five) minutes x 3 doses as needed for chest pain.     Marland Kitchen psyllium (METAMUCIL) 58.6 % powder Take 1 packet by mouth daily.    . rivaroxaban (XARELTO) 15 MG TABS tablet Take 1 tablet (15 mg total) by mouth daily with supper. 30 tablet 1  . rosuvastatin (CRESTOR) 20 MG tablet Take 10 mg by mouth daily.    Marland Kitchen triamcinolone cream (KENALOG) 0.1 % Apply 1 application topically as needed (dry skin irritation).     . vitamin B-12 (CYANOCOBALAMIN) 1000 MCG tablet Take 1,000 mcg by mouth daily.     Marland Kitchen ZETIA 10 MG tablet Take 10 mg by mouth Daily.    . methocarbamol (ROBAXIN) 500 MG tablet Take 1 tablet (500 mg total) by mouth every 8 (eight) hours as needed for muscle spasms. (Patient not taking: Reported on 11/18/2014) 20 tablet 0  . traMADol (ULTRAM) 50 MG tablet Take 1 tablet (50 mg total) by mouth every 6 (six) hours as needed for moderate pain.  (Patient not taking: Reported on 11/18/2014) 20 tablet 0   No current facility-administered medications on file prior to visit.     Review of Systems Constitutional:   No  weight loss, night sweats,  Fevers, chills,  +fatigue, or  lassitude.  HEENT:   No headaches,  Difficulty swallowing,  Tooth/dental problems, or  Sore throat,                No sneezing, itching, ear ache, nasal congestion, post nasal drip,   CV:  No chest pain,  Orthopnea, PND, , anasarca, dizziness, palpitations, syncope.   GI  No heartburn, indigestion, abdominal pain, nausea, vomiting, diarrhea, change in bowel habits, loss of appetite, bloody stools.   Resp: No shortness of breath with exertion or at rest.  No excess mucus, no productive cough,  No non-productive cough,  No coughing up of blood.  No change in color of mucus.  No wheezing.  No chest wall deformity  Skin: no rash or lesions.  GU: no dysuria, change in color of urine, no urgency or frequency.  No flank pain, no hematuria   MS:  No joint pain or swelling.  No decreased range of motion.  No back pain.  Psych:  No change in mood or affect. No depression or anxiety.  No memory loss.         Objective:   Physical Exam GEN: A/Ox3; pleasant , NAD, morbidly obese. Vital signs reviewed  HEENT:  [place]/AT,  EACs-clear, TMs-wnl, NOSE-clear, THROAT-clear, no lesions, no postnasal drip or exudate noted. Class III MP airway   NECK:  Supple w/ fair ROM; no JVD; normal carotid impulses w/o bruits; no thyromegaly or nodules palpated; no lymphadenopathy.  RESP  Clear  P & A; w/o, wheezes/ rales/ or rhonchi.no accessory muscle use, no dullness to percussion  CARD:  RRR, 2/6 SM   , 1+ peripheral edema, pulses intact, no cyanosis or clubbing.  GI:   Soft & nt; nml bowel sounds; no organomegaly or masses detected.  Musco: Warm bil, no deformities or joint swelling noted.   Neuro: alert, no focal deficits noted.    Skin: Warm, no lesions or  rashes         Assessment & Plan:

## 2014-11-22 ENCOUNTER — Telehealth: Payer: Self-pay | Admitting: Pulmonary Disease

## 2014-11-22 DIAGNOSIS — G4733 Obstructive sleep apnea (adult) (pediatric): Secondary | ICD-10-CM

## 2014-11-22 NOTE — Telephone Encounter (Signed)
[phone] pt calling back

## 2014-11-22 NOTE — Telephone Encounter (Signed)
Patient says that he would like to try the full face mask.  He is constantly waking up with dry mouth.  Using Nasal mask currently.  DME: [facility] Order sent for full face mask. Nothing further needed. Closing encounter

## 2014-11-22 NOTE — Telephone Encounter (Signed)
[id number] 

## 2014-11-24 ENCOUNTER — Telehealth: Payer: Self-pay | Admitting: Pulmonary Disease

## 2014-11-24 NOTE — Telephone Encounter (Signed)
Spoke with pt. States that he is still waiting to hear about his CPAP mask. Advised that we sent to the order to [facility] and he would need to contact them. He has their number and will call them. Nothing further was needed.

## 2014-11-24 NOTE — Telephone Encounter (Signed)
Called Melissa with [facility] and received a lady name Mickel Baas. She is going to look into this for [place] and call us back. Will await call back from [place]. Pt is aware of the above information.

## 2014-11-29 ENCOUNTER — Telehealth: Payer: Self-pay | Admitting: Pulmonary Disease

## 2014-11-29 NOTE — Telephone Encounter (Signed)
On 11/21 order was placed and faxed to [facility]. i have sent [name] [name] a message to check on this Called pt and line rings busy wcb

## 2014-11-30 NOTE — Telephone Encounter (Signed)
Per Melissa:  We found his order. The pt is not eligible for a mask through his insurance at this time but we will contact him about a private pay option.  Thanks   --  Spoke with pt. Aware of above. He has already spoken with [facility]. Nothing further needed

## 2014-11-30 NOTE — Telephone Encounter (Signed)
I have resent [name] [name] another message checking on this

## 2014-12-01 DIAGNOSIS — R040 Epistaxis: Secondary | ICD-10-CM | POA: Insufficient documentation

## 2014-12-02 ENCOUNTER — Encounter: Payer: Self-pay | Admitting: Adult Health

## 2014-12-07 ENCOUNTER — Encounter: Payer: Self-pay | Admitting: Internal Medicine

## 2014-12-07 ENCOUNTER — Ambulatory Visit (INDEPENDENT_AMBULATORY_CARE_PROVIDER_SITE_OTHER): Payer: Medicare Other

## 2014-12-07 DIAGNOSIS — J309 Allergic rhinitis, unspecified: Secondary | ICD-10-CM | POA: Diagnosis not present

## 2014-12-13 ENCOUNTER — Telehealth: Payer: Self-pay

## 2014-12-13 NOTE — Telephone Encounter (Signed)
Opened in error

## 2014-12-24 ENCOUNTER — Telehealth: Payer: Self-pay | Admitting: Adult Health

## 2014-12-24 DIAGNOSIS — G4733 Obstructive sleep apnea (adult) (pediatric): Secondary | ICD-10-CM

## 2014-12-24 NOTE — Telephone Encounter (Signed)
Per TP after reviewing download from 11/21/14-12/19-16  Positive mask leaks Control is not as good as we would like Place order for pressure change to autoset CPAP 5-15 Needs new mask fitting or tighten up mask Download in 1 month  Called and spoke with patient's wife. She states that she has noticed that he's mask does make noises during the night at times. I reviewed the results and recs. She stated that he would call our office back if he felt like he needed a new mask. She voiced understanding and had no further questions. Order for pressure change placed. Nothing further needed. Will sign off on message.

## 2014-12-29 ENCOUNTER — Telehealth: Payer: Self-pay | Admitting: Pulmonary Disease

## 2014-12-29 NOTE — Telephone Encounter (Signed)
Per 12/23 order placed: Note    Change CPAP settings to autoset 5-15    ---  Spouse was made aware of this on 12/23 per that phone note. Called spoke with pt. Made him aware of 12/23 phone note. Pt was never informed by spouse. He reports he does not want to change his CPAP settings and is happy with current setting. He is not going to contact [place] back. Will forward to TP so she is aware.

## 2015-01-27 ENCOUNTER — Encounter: Payer: Self-pay | Admitting: Adult Health

## 2015-02-01 ENCOUNTER — Ambulatory Visit (INDEPENDENT_AMBULATORY_CARE_PROVIDER_SITE_OTHER): Payer: Medicare Other

## 2015-02-01 DIAGNOSIS — Z91038 Other insect allergy status: Secondary | ICD-10-CM

## 2015-02-01 DIAGNOSIS — Z91030 Bee allergy status: Secondary | ICD-10-CM

## 2015-02-10 DIAGNOSIS — L821 Other seborrheic keratosis: Secondary | ICD-10-CM | POA: Diagnosis not present

## 2015-02-10 DIAGNOSIS — L2089 Other atopic dermatitis: Secondary | ICD-10-CM | POA: Diagnosis not present

## 2015-02-10 DIAGNOSIS — D1801 Hemangioma of skin and subcutaneous tissue: Secondary | ICD-10-CM | POA: Diagnosis not present

## 2015-02-10 DIAGNOSIS — L570 Actinic keratosis: Secondary | ICD-10-CM | POA: Diagnosis not present

## 2015-02-23 DIAGNOSIS — T63441A Toxic effect of venom of bees, accidental (unintentional), initial encounter: Secondary | ICD-10-CM | POA: Diagnosis not present

## 2015-02-24 DIAGNOSIS — T63441A Toxic effect of venom of bees, accidental (unintentional), initial encounter: Secondary | ICD-10-CM | POA: Diagnosis not present

## 2015-03-01 DIAGNOSIS — R42 Dizziness and giddiness: Secondary | ICD-10-CM | POA: Diagnosis not present

## 2015-03-04 ENCOUNTER — Emergency Department (HOSPITAL_COMMUNITY)
Admission: EM | Admit: 2015-03-04 | Discharge: 2015-03-04 | Disposition: A | Discharge status: Home / Self Care | Payer: Medicare Other | Attending: Emergency Medicine | Admitting: Emergency Medicine

## 2015-03-04 ENCOUNTER — Emergency Department (HOSPITAL_COMMUNITY): Payer: Medicare Other

## 2015-03-04 ENCOUNTER — Encounter (HOSPITAL_COMMUNITY): Payer: Self-pay | Admitting: Emergency Medicine

## 2015-03-04 DIAGNOSIS — M546 Pain in thoracic spine: Secondary | ICD-10-CM | POA: Diagnosis not present

## 2015-03-04 DIAGNOSIS — Z87442 Personal history of urinary calculi: Secondary | ICD-10-CM | POA: Diagnosis not present

## 2015-03-04 DIAGNOSIS — M542 Cervicalgia: Secondary | ICD-10-CM | POA: Diagnosis not present

## 2015-03-04 DIAGNOSIS — M791 Myalgia: Secondary | ICD-10-CM | POA: Diagnosis not present

## 2015-03-04 DIAGNOSIS — I25119 Atherosclerotic heart disease of native coronary artery with unspecified angina pectoris: Secondary | ICD-10-CM | POA: Diagnosis not present

## 2015-03-04 DIAGNOSIS — R011 Cardiac murmur, unspecified: Secondary | ICD-10-CM | POA: Insufficient documentation

## 2015-03-04 DIAGNOSIS — Z951 Presence of aortocoronary bypass graft: Secondary | ICD-10-CM | POA: Insufficient documentation

## 2015-03-04 DIAGNOSIS — I10 Essential (primary) hypertension: Secondary | ICD-10-CM | POA: Diagnosis not present

## 2015-03-04 DIAGNOSIS — Z79899 Other long term (current) drug therapy: Secondary | ICD-10-CM | POA: Diagnosis not present

## 2015-03-04 DIAGNOSIS — R51 Headache: Secondary | ICD-10-CM | POA: Diagnosis not present

## 2015-03-04 DIAGNOSIS — E785 Hyperlipidemia, unspecified: Secondary | ICD-10-CM | POA: Diagnosis not present

## 2015-03-04 DIAGNOSIS — E669 Obesity, unspecified: Secondary | ICD-10-CM | POA: Insufficient documentation

## 2015-03-04 DIAGNOSIS — I5021 Acute systolic (congestive) heart failure: Secondary | ICD-10-CM | POA: Insufficient documentation

## 2015-03-04 DIAGNOSIS — Z87891 Personal history of nicotine dependence: Secondary | ICD-10-CM | POA: Insufficient documentation

## 2015-03-04 DIAGNOSIS — R0789 Other chest pain: Secondary | ICD-10-CM | POA: Insufficient documentation

## 2015-03-04 DIAGNOSIS — Z7982 Long term (current) use of aspirin: Secondary | ICD-10-CM | POA: Diagnosis not present

## 2015-03-04 DIAGNOSIS — M109 Gout, unspecified: Secondary | ICD-10-CM | POA: Insufficient documentation

## 2015-03-04 DIAGNOSIS — M7918 Myalgia, other site: Secondary | ICD-10-CM

## 2015-03-04 DIAGNOSIS — R079 Chest pain, unspecified: Secondary | ICD-10-CM | POA: Diagnosis not present

## 2015-03-04 LAB — I-STAT TROPONIN, ED: Troponin i, poc: 0.01 ng/mL (ref 0.00–0.08)

## 2015-03-04 LAB — BASIC METABOLIC PANEL
ANION GAP: 12 (ref 5–15)
BUN: 16 mg/dL (ref 6–20)
CO2: 24 mmol/L (ref 22–32)
Calcium: 9.5 mg/dL (ref 8.9–10.3)
Chloride: 101 mmol/L (ref 101–111)
Creatinine, Ser: 1.25 mg/dL — ABNORMAL HIGH (ref 0.61–1.24)
GFR calc Af Amer: 58 mL/min — ABNORMAL LOW (ref >60)
GFR calc non Af Amer: 50 mL/min — ABNORMAL LOW (ref >60)
GLUCOSE: 171 mg/dL — AB (ref 65–99)
POTASSIUM: 4.1 mmol/L (ref 3.5–5.1)
Sodium: 137 mmol/L (ref 135–145)

## 2015-03-04 LAB — CBC
HEMATOCRIT: 38 % — AB (ref 39.0–52.0)
HEMOGLOBIN: 12.2 g/dL — AB (ref 13.0–17.0)
MCH: 28.9 pg (ref 26.0–34.0)
MCHC: 32.1 g/dL (ref 30.0–36.0)
MCV: 90 fL (ref 78.0–100.0)
Platelets: 143 10*3/uL — ABNORMAL LOW (ref 150–400)
RBC: 4.22 MIL/uL (ref 4.22–5.81)
RDW: 14.1 % (ref 11.5–15.5)
WBC: 4.7 10*3/uL (ref 4.0–10.5)

## 2015-03-04 NOTE — ED Notes (Signed)
Pt ambulating independently w/ steady gait on d/c in no acute distress, A&Ox4.D/c instructions reviewed w/ pt and family - pt and family deny any further questions or concerns at present.  

## 2015-03-04 NOTE — ED Notes (Signed)
Pt sts back and bilateral arm pain starting yesterday with generalized CP today; pt denies increased SOB

## 2015-03-04 NOTE — Discharge Instructions (Signed)

## 2015-03-04 NOTE — ED Provider Notes (Signed)
CSN: [id number]     Arrival date & time 03/04/15  [id number] History   First MD Initiated Contact with Patient 03/04/15 0915     Chief Complaint  Patient presents with  . Chest Pain  . Arm Pain     (Consider location/radiation/quality/duration/timing/severity/associated sxs/prior Treatment) HPI   [name] [name] is an 82 year old gentleman with a PMH of CABG x3 (2014), aortic dilatation, chronic venous insufficiency, atrial fibrillation (atenolol, xarelto), and OSA (on CPAP) who presents with upper back pain. The dull pain originated between his scapulae and radiates down both his arms and terminates proximal to his hands. He also describes it radiating to his occipital region. He took an oxycodone that was minimally effective. Late that night, he also noticed some chest pain across his middle chest, for which he took nitroglycerine which was somewhat effective. Of note, he had been working on a strenuous home improvement project yesterday. He uses NTG on occasion but not frequently. His only new medication is amoxicillin.   Past Medical History  Diagnosis Date  . Hypertension   . Angina   . Coronary artery disease   . CAD (coronary artery disease)     Cath April 2012 40% left main, 50% LAD, 40% OM, occluded RCA with L-R collaterals   . Hypertension   . BPH (benign prostatic hypertrophy)   . Chronic venous insufficiency   . Gout   . Hyperlipidemia   . Obesity (BMI 30-39.9)   . Glaucoma   . Gout   . Acute coronary syndrome ([place]) 02/29/2012  . Atrial fibrillation ([place]) 02/29/2012    Recurrent paroxysmal, new-onset  . Acute systolic heart failure ([place]) 03/03/2012  . S/P CABG x 3 03/06/2012    LIMA to LAD, SVG to OM, SVG to RCA, EVH via right thigh  . S/P Maze operation for atrial fibrillation 03/06/2012    Complete bilateral lesions set using bipolar radiofrequency and cryothermy ablation with clipping of LA appendage  . Ascending aorta dilatation ([facility]) 03/13/2012    Fusiform dilatation of the  ascending thoracic aorta discovered during surgery, max diameter 4.2-4.3 cm  . Kidney stones    Past Surgical History  Procedure Laterality Date  . Cardiac catheterization    . Prostatectomy    . Cataract extraction, bilateral    . Coronary artery bypass graft N/A 03/06/2012    Procedure: CORONARY ARTERY BYPASS GRAFTING (CABG);  Surgeon: Rexene Alberts, MD;  Location: [facility];  Service: Open Heart Surgery;  Laterality: N/A;  . Maze N/A 03/06/2012    Procedure: MAZE;  Surgeon: Rexene Alberts, MD;  Location: [facility];  Service: Open Heart Surgery;  Laterality: N/A;  . Intraoperative transesophageal echocardiogram N/A 03/06/2012    Procedure: INTRAOPERATIVE TRANSESOPHAGEAL ECHOCARDIOGRAM;  Surgeon: Rexene Alberts, MD;  Location: [facility];  Service: Open Heart Surgery;  Laterality: N/A;  . Endovein harvest of greater saphenous vein Right 03/06/2012    Procedure: ENDOVEIN HARVEST OF GREATER SAPHENOUS VEIN;  Surgeon: Rexene Alberts, MD;  Location: [facility];  Service: Open Heart Surgery;  Laterality: Right;  . Cholecystectomy N/A 03/24/2012    Procedure: LAPAROSCOPIC CHOLECYSTECTOMY WITH INTRAOPERATIVE CHOLANGIOGRAM;  Surgeon: Zenovia Jarred, MD;  Location: [facility];  Service: General;  Laterality: N/A;  . Left heart cath Right 03/02/2012    Procedure: LEFT HEART CATH;  Surgeon: Sherren Mocha, MD;  Location: [facility] CATH LAB;  Service: Cardiovascular;  Laterality: Right;   Family History  Problem Relation Age of Onset  . Dementia Mother   .  Stroke Brother    Social History  Substance Use Topics  . Smoking status: Former Smoker    Quit date: 11/18/1962  . Smokeless tobacco: Never Used  . Alcohol Use: 0.6 oz/week    1 Shots of liquor per week     Comment: weekly    Review of Systems  Constitutional: Negative for fever and diaphoresis.  HENT: Positive for dental problem. Negative for sore throat.   Eyes: Negative for photophobia and visual disturbance.  Respiratory: Negative for chest tightness.    Cardiovascular: Positive for chest pain and leg swelling. Negative for palpitations.  Gastrointestinal: Negative for nausea and abdominal pain.  Endocrine: Negative for polydipsia and polyuria.  Musculoskeletal: Positive for back pain and neck pain.  Neurological: Positive for headaches. Negative for numbness.  Psychiatric/Behavioral: Negative for dysphoric mood. The patient is not nervous/anxious.        Allergies  Bee venom and Morphine and related  Home Medications   Prior to Admission medications   Medication Sig Start Date End Date Taking? Authorizing Provider  allopurinol (ZYLOPRIM) 300 MG tablet Take 150 mg by mouth Daily. 02/26/11   Historical Provider, MD  aspirin 81 MG tablet Take 81 mg by mouth daily.    Historical Provider, MD  atenolol (TENORMIN) 50 MG tablet Take 25 mg by mouth daily. 09/07/14   Historical Provider, MD  cholecalciferol (VITAMIN D) 1000 UNITS tablet Take 1,000 Units by mouth daily.    Historical Provider, MD  DiphenhydrAMINE HCl (BENADRYL ALLERGY PO) Take 25 mg by mouth as needed.     Historical Provider, MD  EPINEPHrine (EPIPEN 2-PAK) 0.3 mg/0.3 mL IJ SOAJ injection Inject 0.3 mLs (0.3 mg total) into the muscle once. [place] [id number] EXP 10/17 09/30/14   Jiles Prows, MD  fish oil-omega-3 fatty acids 1000 MG capsule Take 1 g by mouth daily.     Historical Provider, MD  furosemide (LASIX) 40 MG tablet Take 20 mg by mouth daily. 03/25/12   Jacolyn Reedy, MD  galantamine (RAZADYNE ER) 8 MG 24 hr capsule Take 8 mg by mouth Daily. 02/26/11   Historical Provider, MD  HONEY BEE VENOM IJ Inject as directed.    Historical Provider, MD  methocarbamol (ROBAXIN) 500 MG tablet Take 1 tablet (500 mg total) by mouth every 8 (eight) hours as needed for muscle spasms. Patient not taking: Reported on 11/18/2014 10/25/14   Costin Karlyne Greenspan, MD  NITROSTAT 0.4 MG SL tablet Place 1 tablet under the tongue every 5 (five) minutes x 3 doses as needed for chest pain.   04/01/11   Historical Provider, MD  pantoprazole (PROTONIX) 40 MG tablet Take 40 mg by mouth daily. 10/28/14   Historical Provider, MD  psyllium (METAMUCIL) 58.6 % powder Take 1 packet by mouth daily.    Historical Provider, MD  rivaroxaban (XARELTO) 15 MG TABS tablet Take 1 tablet (15 mg total) by mouth daily with supper. 03/11/12   Erin R Barrett, PA-C  rosuvastatin (CRESTOR) 20 MG tablet Take 10 mg by mouth daily.    Historical Provider, MD  traMADol (ULTRAM) 50 MG tablet Take 1 tablet (50 mg total) by mouth every 6 (six) hours as needed for moderate pain. Patient not taking: Reported on 11/18/2014 10/25/14   [name], MD  triamcinolone cream (KENALOG) 0.1 % Apply 1 application topically as needed (dry skin irritation).     Historical Provider, MD  vitamin B-12 (CYANOCOBALAMIN) 1000 MCG tablet Take 1,000 mcg by mouth daily.  Historical Provider, MD  ZETIA 10 MG tablet Take 10 mg by mouth Daily. 02/26/11   Historical Provider, MD   BP 129/66 mmHg  Pulse 55  Temp(Src) 97.8 F (36.6 C) (Oral)  Resp 18  SpO2 99% Physical Exam  Constitutional: He appears well-developed and well-nourished. No distress.  HENT:  Head: Normocephalic and atraumatic.  Mouth/Throat: Oropharynx is clear and moist. No oropharyngeal exudate.  No sores or abscesses.  Eyes: EOM are normal. Pupils are equal, round, and reactive to light. No scleral icterus.  Neck: Neck supple. No JVD present.  Cardiovascular: Normal rate and regular rhythm.  Exam reveals no gallop and no friction rub.   Murmur heard. 2/6 SM radiating to carotids.  Pulmonary/Chest: Effort normal and breath sounds normal. No respiratory distress. He has no wheezes. He has no rales.  Abdominal: Soft. Bowel sounds are normal. He exhibits no distension. There is no tenderness.  Musculoskeletal: Normal range of motion. He exhibits edema.  1+ edema in lower extremities. Prominent LE veins.  Neurological: He is alert. He has normal reflexes.  Skin:  Skin is warm and dry. He is not diaphoretic.  Psychiatric: He has a normal mood and affect. His behavior is normal.  Vitals reviewed.   ED Course  Procedures (including critical care time) Labs Review Labs Reviewed  CBC - Abnormal; Notable for the following:    Hemoglobin 12.2 (*)    HCT 38.0 (*)    Platelets 143 (*)    All other components within normal limits  BASIC METABOLIC PANEL  I-STAT TROPOININ, ED    Imaging Review Dg Chest 2 View  03/04/2015  CLINICAL DATA:  Chest pain for 2 days, initial encounter EXAM: CHEST  2 VIEW COMPARISON:  10/25/2014 FINDINGS: Cardiomegaly is again noted. Postsurgical changes are again seen and stable. Aortic calcifications are. The lungs are clear bilaterally. No acute bony abnormality is seen. IMPRESSION: No active cardiopulmonary disease. Electronically Signed   By: Inez Catalina M.D.   On: 03/04/2015 09:17   I have personally reviewed and evaluated these images and lab results as part of my medical decision-making.   EKG Interpretation   Date/Time:  Friday March 04 2015 09:00:26 EST Ventricular Rate:  69 PR Interval:    QRS Duration: 144 QT Interval:  472 QTC Calculation: 505 R Axis:   4 Text Interpretation:  Wide QRS rhythm with occasional Premature  ventricular complexes Right bundle branch block Abnormal ECG since last  tracing no significant change Confirmed by WENTZ  MD, ELLIOTT [id number]) on  03/04/2015 9:18:13 AM      MDM   Final diagnoses:  None   EKG not significantly changes from prior, only +PVC and stable RBBB. i-Stat troponin 0.01. 2-view CXR shows no acute disease and no evidence of acute changes in the thoracic aorta based on previous PA CXR. On exam, he appears comfortable, non-diaphoretic, and says he has some persistent chest pain.  Physical exam, CXR, and laboratory findings are not consistent with a patient with STEMI/NSTEMI/Unstable Angina or aortic dissection. In the settting of strenuous activity, it is likely his  symptoms are related to musculoskeletal strain. He was counseled to use tylenol and heat to address this. He was also noted to have an elevated glucose (171) despite not eating breakfast. He was encouraged to follow up with diabetes screening with his PCP, to which he was agreeable.   Liberty Handy, MD 03/04/15 [place], MD 03/05/15 [phone]

## 2015-03-04 NOTE — ED Provider Notes (Signed)
I saw and evaluated the patient, reviewed the resident's note and I agree with the findings and plan.   EKG Interpretation   Date/Time:  Friday March 04 2015 09:00:26 EST Ventricular Rate:  69 PR Interval:    QRS Duration: 144 QT Interval:  472 QTC Calculation: 505 R Axis:   4 Text Interpretation:  Wide QRS rhythm with occasional Premature  ventricular complexes Right bundle branch block Abnormal ECG since last  tracing no significant change Confirmed by Eulis Foster  MD, Vira Agar [phone]) on  03/04/2015 9:18:13 AM       [name] [name] is a 82 y.o. male who presents for evaluation of back and arm pain, which started yesterday and is associated with chest pain, today. Chest pain started during the night, and improved after taking nitroglycerin but has remained stable, mild. No associated diaphoresis or shortness of breath. He recalls working, tearing plaster off a wall with a [employer] hammer yesterday. He has pain in his upper back and neck which radiates to his head.  Alert, calm, cooperative. Neck is normal range of motion. Heart regular rate and rhythm, no murmur. Lungs clear. Normal pulses at the right wrist.  MDM- atypical chest pain with component of musculoskeletal discomfort and clinical history consistent with noncardiac or vascular source.   Daleen Bo, MD 03/05/15 [phone]

## 2015-03-04 NOTE — ED Notes (Signed)
Patient given graham crackers, peanut butter and a spoon

## 2015-03-08 ENCOUNTER — Ambulatory Visit: Payer: Medicare Other | Admitting: Pulmonary Disease

## 2015-03-08 ENCOUNTER — Encounter (HOSPITAL_COMMUNITY): Payer: Self-pay | Admitting: *Deleted

## 2015-03-08 ENCOUNTER — Emergency Department (HOSPITAL_COMMUNITY)
Admission: EM | Admit: 2015-03-08 | Discharge: 2015-03-08 | Disposition: A | Discharge status: Home / Self Care | Payer: Medicare Other | Attending: Emergency Medicine | Admitting: Emergency Medicine

## 2015-03-08 ENCOUNTER — Emergency Department (HOSPITAL_COMMUNITY): Payer: Medicare Other

## 2015-03-08 DIAGNOSIS — Z6839 Body mass index (BMI) 39.0-39.9, adult: Secondary | ICD-10-CM | POA: Insufficient documentation

## 2015-03-08 DIAGNOSIS — I5021 Acute systolic (congestive) heart failure: Secondary | ICD-10-CM | POA: Diagnosis not present

## 2015-03-08 DIAGNOSIS — J111 Influenza due to unidentified influenza virus with other respiratory manifestations: Secondary | ICD-10-CM | POA: Insufficient documentation

## 2015-03-08 DIAGNOSIS — I10 Essential (primary) hypertension: Secondary | ICD-10-CM | POA: Diagnosis not present

## 2015-03-08 DIAGNOSIS — R079 Chest pain, unspecified: Secondary | ICD-10-CM | POA: Insufficient documentation

## 2015-03-08 DIAGNOSIS — E669 Obesity, unspecified: Secondary | ICD-10-CM | POA: Insufficient documentation

## 2015-03-08 DIAGNOSIS — I25119 Atherosclerotic heart disease of native coronary artery with unspecified angina pectoris: Secondary | ICD-10-CM | POA: Insufficient documentation

## 2015-03-08 LAB — I-STAT TROPONIN, ED: Troponin i, poc: 0.01 ng/mL (ref 0.00–0.08)

## 2015-03-08 LAB — CBC
HEMATOCRIT: 37.2 % — AB (ref 39.0–52.0)
Hemoglobin: 12.6 g/dL — ABNORMAL LOW (ref 13.0–17.0)
MCH: 30 pg (ref 26.0–34.0)
MCHC: 33.9 g/dL (ref 30.0–36.0)
MCV: 88.6 fL (ref 78.0–100.0)
PLATELETS: 167 10*3/uL (ref 150–400)
RBC: 4.2 MIL/uL — ABNORMAL LOW (ref 4.22–5.81)
RDW: 14.2 % (ref 11.5–15.5)
WBC: 6.2 10*3/uL (ref 4.0–10.5)

## 2015-03-08 LAB — BASIC METABOLIC PANEL
ANION GAP: 12 (ref 5–15)
BUN: 13 mg/dL (ref 6–20)
CALCIUM: 9.5 mg/dL (ref 8.9–10.3)
CO2: 26 mmol/L (ref 22–32)
Chloride: 97 mmol/L — ABNORMAL LOW (ref 101–111)
Creatinine, Ser: 1.44 mg/dL — ABNORMAL HIGH (ref 0.61–1.24)
GFR, EST AFRICAN AMERICAN: 49 mL/min — AB (ref >60)
GFR, EST NON AFRICAN AMERICAN: 42 mL/min — AB (ref >60)
GLUCOSE: 113 mg/dL — AB (ref 65–99)
Potassium: 4.2 mmol/L (ref 3.5–5.1)
SODIUM: 135 mmol/L (ref 135–145)

## 2015-03-08 NOTE — ED Notes (Signed)
Patient states that he is not having any chest pain at this time.  Patient had vitals and decided that he was going to go home.  Patient urged to stay.  Patient and wife both stated that they will be back if the chest pain reoccurs.

## 2015-03-08 NOTE — ED Notes (Addendum)
pt c/o chest pain since this morning. Stakes he has taken 2 NTG with some relief. Pt states he also has the flu, diagnosed on Friday.

## 2015-03-15 DIAGNOSIS — R51 Headache: Secondary | ICD-10-CM | POA: Diagnosis not present

## 2015-03-15 DIAGNOSIS — J111 Influenza due to unidentified influenza virus with other respiratory manifestations: Secondary | ICD-10-CM | POA: Insufficient documentation

## 2015-03-15 DIAGNOSIS — R0609 Other forms of dyspnea: Secondary | ICD-10-CM | POA: Diagnosis not present

## 2015-03-15 DIAGNOSIS — Z6832 Body mass index (BMI) 32.0-32.9, adult: Secondary | ICD-10-CM | POA: Diagnosis not present

## 2015-03-17 DIAGNOSIS — K219 Gastro-esophageal reflux disease without esophagitis: Secondary | ICD-10-CM | POA: Diagnosis not present

## 2015-03-17 DIAGNOSIS — G4733 Obstructive sleep apnea (adult) (pediatric): Secondary | ICD-10-CM | POA: Diagnosis not present

## 2015-03-17 DIAGNOSIS — E669 Obesity, unspecified: Secondary | ICD-10-CM | POA: Diagnosis not present

## 2015-03-17 DIAGNOSIS — E785 Hyperlipidemia, unspecified: Secondary | ICD-10-CM | POA: Diagnosis not present

## 2015-03-17 DIAGNOSIS — I4891 Unspecified atrial fibrillation: Secondary | ICD-10-CM | POA: Diagnosis not present

## 2015-03-17 DIAGNOSIS — I129 Hypertensive chronic kidney disease with stage 1 through stage 4 chronic kidney disease, or unspecified chronic kidney disease: Secondary | ICD-10-CM | POA: Diagnosis not present

## 2015-03-17 DIAGNOSIS — M109 Gout, unspecified: Secondary | ICD-10-CM | POA: Diagnosis not present

## 2015-03-17 DIAGNOSIS — Z8709 Personal history of other diseases of the respiratory system: Secondary | ICD-10-CM | POA: Diagnosis not present

## 2015-03-17 DIAGNOSIS — N183 Chronic kidney disease, stage 3 (moderate): Secondary | ICD-10-CM | POA: Diagnosis not present

## 2015-03-17 DIAGNOSIS — I2510 Atherosclerotic heart disease of native coronary artery without angina pectoris: Secondary | ICD-10-CM | POA: Diagnosis not present

## 2015-03-24 ENCOUNTER — Other Ambulatory Visit: Payer: Self-pay | Admitting: Thoracic Surgery (Cardiothoracic Vascular Surgery)

## 2015-03-24 DIAGNOSIS — I712 Thoracic aortic aneurysm, without rupture, unspecified: Secondary | ICD-10-CM

## 2015-03-30 ENCOUNTER — Ambulatory Visit (INDEPENDENT_AMBULATORY_CARE_PROVIDER_SITE_OTHER): Payer: Medicare Other

## 2015-03-30 ENCOUNTER — Other Ambulatory Visit: Payer: Self-pay | Admitting: Thoracic Surgery (Cardiothoracic Vascular Surgery)

## 2015-03-30 DIAGNOSIS — T63441D Toxic effect of venom of bees, accidental (unintentional), subsequent encounter: Secondary | ICD-10-CM

## 2015-03-30 DIAGNOSIS — I712 Thoracic aortic aneurysm, without rupture: Secondary | ICD-10-CM

## 2015-03-30 DIAGNOSIS — I7121 Aneurysm of the ascending aorta, without rupture: Secondary | ICD-10-CM

## 2015-04-04 DIAGNOSIS — Z1389 Encounter for screening for other disorder: Secondary | ICD-10-CM | POA: Diagnosis not present

## 2015-04-04 DIAGNOSIS — R7301 Impaired fasting glucose: Secondary | ICD-10-CM | POA: Diagnosis not present

## 2015-04-04 DIAGNOSIS — R5383 Other fatigue: Secondary | ICD-10-CM | POA: Diagnosis not present

## 2015-04-04 DIAGNOSIS — I10 Essential (primary) hypertension: Secondary | ICD-10-CM | POA: Diagnosis not present

## 2015-04-04 DIAGNOSIS — Z6832 Body mass index (BMI) 32.0-32.9, adult: Secondary | ICD-10-CM | POA: Diagnosis not present

## 2015-04-04 DIAGNOSIS — I480 Paroxysmal atrial fibrillation: Secondary | ICD-10-CM | POA: Diagnosis not present

## 2015-04-14 ENCOUNTER — Ambulatory Visit (INDEPENDENT_AMBULATORY_CARE_PROVIDER_SITE_OTHER): Payer: Medicare Other | Admitting: Pulmonary Disease

## 2015-04-14 ENCOUNTER — Encounter: Payer: Self-pay | Admitting: Pulmonary Disease

## 2015-04-14 VITALS — BP 148/80 | HR 68 | Ht 73.0 in | Wt 244.4 lb

## 2015-04-14 DIAGNOSIS — Z9989 Dependence on other enabling machines and devices: Secondary | ICD-10-CM

## 2015-04-14 DIAGNOSIS — G4733 Obstructive sleep apnea (adult) (pediatric): Secondary | ICD-10-CM | POA: Diagnosis not present

## 2015-04-14 NOTE — Progress Notes (Signed)
Current Outpatient Prescriptions on File Prior to Visit  Medication Sig  . allopurinol (ZYLOPRIM) 300 MG tablet Take 150 mg by mouth Daily.  Marland Kitchen atenolol (TENORMIN) 50 MG tablet Take 25 mg by mouth daily.  . cholecalciferol (VITAMIN D) 1000 UNITS tablet Take 1,000 Units by mouth daily.  . DiphenhydrAMINE HCl (BENADRYL ALLERGY PO) Take 25 mg by mouth as needed.   Marland Kitchen EPINEPHrine (EPIPEN 2-PAK) 0.3 mg/0.3 mL IJ SOAJ injection Inject 0.3 mLs (0.3 mg total) into the muscle once. [place] [id number] LOT [id number] EXP 10/17  . fish oil-omega-3 fatty acids 1000 MG capsule Take 1 g by mouth daily.   . furosemide (LASIX) 40 MG tablet Take 20 mg by mouth daily.  Marland Kitchen galantamine (RAZADYNE ER) 8 MG 24 hr capsule Take 8 mg by mouth Daily.  . HONEY BEE VENOM IJ Inject as directed.  Marland Kitchen NITROSTAT 0.4 MG SL tablet Place 1 tablet under the tongue every 5 (five) minutes x 3 doses as needed for chest pain.   . pantoprazole (PROTONIX) 40 MG tablet Take 40 mg by mouth daily.  . psyllium (METAMUCIL) 58.6 % powder Take 1 packet by mouth daily.  . rivaroxaban (XARELTO) 15 MG TABS tablet Take 1 tablet (15 mg total) by mouth daily with supper.  . rosuvastatin (CRESTOR) 20 MG tablet Take 10 mg by mouth daily.  Marland Kitchen triamcinolone cream (KENALOG) 0.1 % Apply 1 application topically as needed (dry skin irritation).   . vitamin B-12 (CYANOCOBALAMIN) 1000 MCG tablet Take 1,000 mcg by mouth daily.   Marland Kitchen ZETIA 10 MG tablet Take 10 mg by mouth Daily.   No current facility-administered medications on file prior to visit.     Chief Complaint  Patient presents with  . Follow-up    Wears CPAP nightly. Denies any issues with mask or pressure. DME: [facility]     Tests Echo 03/10/12 >> EF 45 to 50% Echo 10/25/14 >> mod LVH, EF 60 to 65%, mild/mod AR, mod LA dilation, PAS 47 mmHg PSG 07/29/14 >> AHI 29.5, SaO2 low 88%.  CPAP 12 >> AHI 0 CPAP 01/13/15 to 04/12/15 >> used on 80 of 90 nights with average 7 hrs 19 min.  Average AHI 10.7 with CPAP 12 cm  H2O  Past medical hx HTN, CAD s/p CABG 2014, A fib s/p MAZE [id number], systolic CHF, HLD, BPH, Gout, Glaucoma, Nephrolithiasis  Past surgical hx, Allergies, Family hx, Social hx all reviewed.  Vital Signs BP 148/80 mmHg  Pulse 68  Ht 6\' 1"  (1.854 m)  Wt 244 lb 6.4 oz (110.859 kg)  BMI 32.25 kg/m2  SpO2 98%  History of Present Illness [name] [name] is a 82 y.o. male with Obstructive sleep apnea.  He was not able to use CPAP for few weeks when he got sick.  He has been doing better with CPAP recently.  He has full face mask.  He gets occasional mask leak.  He feels CPAP helps his sleep and alertness.   Physical Exam  General - No distress ENT - No sinus tenderness, no oral exudate, no LAN, MP 3 Cardiac - s1s2 regular, no murmur Chest - No wheeze/rales/dullness Back - No focal tenderness Abd - Soft, non-tender Ext - No edema Neuro - Normal strength Skin - No rashes Psych - normal mood, and behavior   Assessment/Plan  Obstructive sleep apnea. - will have his CPAP increased to 13 cm H2O   Patient Instructions  Will have [facility] CPAP to 13  Call if  increased CPAP setting is uncomfortable  Follow up in 1 year     Chesley Mires, MD [facility] Pager:  [phone] 04/14/2015, 9:15 AM

## 2015-04-14 NOTE — Patient Instructions (Signed)
Will have [facility] increase CPAP to 13  Call if increased CPAP setting is uncomfortable  Follow up in 1 year

## 2015-04-19 DIAGNOSIS — I259 Chronic ischemic heart disease, unspecified: Secondary | ICD-10-CM | POA: Diagnosis not present

## 2015-04-19 DIAGNOSIS — I4891 Unspecified atrial fibrillation: Secondary | ICD-10-CM | POA: Diagnosis not present

## 2015-04-19 DIAGNOSIS — I5021 Acute systolic (congestive) heart failure: Secondary | ICD-10-CM | POA: Diagnosis not present

## 2015-04-19 DIAGNOSIS — G4733 Obstructive sleep apnea (adult) (pediatric): Secondary | ICD-10-CM | POA: Diagnosis not present

## 2015-04-19 DIAGNOSIS — I10 Essential (primary) hypertension: Secondary | ICD-10-CM | POA: Diagnosis not present

## 2015-04-22 DIAGNOSIS — I712 Thoracic aortic aneurysm, without rupture: Secondary | ICD-10-CM | POA: Diagnosis not present

## 2015-04-22 LAB — CREATININE, ISTAT: CREATININE, ISTAT: 1.1 mg/dL (ref 0.6–1.3)

## 2015-04-25 ENCOUNTER — Ambulatory Visit
Admission: RE | Admit: 2015-04-25 | Discharge: 2015-04-25 | Disposition: A | Discharge status: Home / Self Care | Payer: Medicare Other | Source: Ambulatory Visit | Attending: Thoracic Surgery (Cardiothoracic Vascular Surgery) | Admitting: Thoracic Surgery (Cardiothoracic Vascular Surgery)

## 2015-04-25 ENCOUNTER — Encounter: Payer: Self-pay | Admitting: Thoracic Surgery (Cardiothoracic Vascular Surgery)

## 2015-04-25 ENCOUNTER — Ambulatory Visit (INDEPENDENT_AMBULATORY_CARE_PROVIDER_SITE_OTHER): Payer: Medicare Other | Admitting: Thoracic Surgery (Cardiothoracic Vascular Surgery)

## 2015-04-25 VITALS — BP 160/70 | HR 52 | Resp 16 | Ht 73.0 in | Wt 244.0 lb

## 2015-04-25 DIAGNOSIS — I712 Thoracic aortic aneurysm, without rupture, unspecified: Secondary | ICD-10-CM

## 2015-04-25 DIAGNOSIS — I77810 Thoracic aortic ectasia: Secondary | ICD-10-CM

## 2015-04-25 DIAGNOSIS — Z8679 Personal history of other diseases of the circulatory system: Secondary | ICD-10-CM

## 2015-04-25 DIAGNOSIS — Z9889 Other specified postprocedural states: Secondary | ICD-10-CM | POA: Diagnosis not present

## 2015-04-25 DIAGNOSIS — I7121 Aneurysm of the ascending aorta, without rupture: Secondary | ICD-10-CM

## 2015-04-25 DIAGNOSIS — Z951 Presence of aortocoronary bypass graft: Secondary | ICD-10-CM

## 2015-04-25 MED ORDER — IOPAMIDOL (ISOVUE-370) INJECTION 76%
75.0000 mL | Freq: Once | INTRAVENOUS | Status: AC | PRN
  Administered 2015-04-25: 75 mL via INTRAVENOUS

## 2015-04-25 NOTE — Patient Instructions (Addendum)
Continue all previous medications without any changes at this time  Monitor your blood pressure regularly and follow up with your cardiologist and primary care physician to keep it under good control

## 2015-04-25 NOTE — Progress Notes (Signed)
[address]       [place] [address]             [phone]     CARDIOTHORACIC SURGERY OFFICE NOTE  Referring Provider is Jacolyn Reedy, MD PCP is [name], MD   HPI:  Patient returns to the office today for surveillance of mild aneurysmal enlargement of the proximal ascending thoracic aorta. He originally underwent coronary artery bypass grafting 3 and Maze procedure on 03/06/2012. He was last seen in the office on 04/05/2014. Over the past year he has remained clinically stable from a cardiac standpoint.  He is followed carefully by Dr. Wynonia Lawman.  The patient denies any symptoms of palpitations or other signs to suggest a recurrence of atrial fibrillation, but recently he underwent a 30 day Holter monitor study. The results of that test are not currently available, but according to the patient he may have had some silent atrial fibrillation. He remains chronically anticoagulated using Xarelto.  The patient is complaint is that recently he has had 2 fairly significant reactions to the administration of oral amoxicillin. He was given amoxicillin because of need for oral surgery, and on both occasions he developed severe headaches and generalized malaise that took a considerable period of time to resolve.  He reports no history of chest pain or chest tightness with activity or at rest. He has been monitoring his blood pressure at home. His blood pressure has consistently been running relatively high with systolic blood pressure ranging between 140 and 180 mmHg.  Current Outpatient Prescriptions  Medication Sig Dispense Refill  . allopurinol (ZYLOPRIM) 300 MG tablet Take 150 mg by mouth Daily.    Marland Kitchen atenolol (TENORMIN) 50 MG tablet Take 12.5 mg by mouth daily.   3  . cholecalciferol (VITAMIN D) 1000 UNITS tablet Take 1,000 Units by mouth daily.    . DiphenhydrAMINE HCl (BENADRYL ALLERGY PO) Take 25 mg by mouth as needed.     Marland Kitchen EPINEPHrine (EPIPEN 2-PAK) 0.3  mg/0.3 mL IJ SOAJ injection Inject 0.3 mLs (0.3 mg total) into the muscle once. [place] [id number] LOT [id number] EXP 10/17 1 Device 0  . fish oil-omega-3 fatty acids 1000 MG capsule Take 1 g by mouth daily.     . furosemide (LASIX) 40 MG tablet Take 20 mg by mouth daily.    Marland Kitchen galantamine (RAZADYNE ER) 8 MG 24 hr capsule Take 8 mg by mouth Daily.    . HONEY BEE VENOM IJ Inject as directed.    Marland Kitchen NITROSTAT 0.4 MG SL tablet Place 1 tablet under the tongue every 5 (five) minutes x 3 doses as needed for chest pain.     . pantoprazole (PROTONIX) 40 MG tablet Take 40 mg by mouth daily.  4  . psyllium (METAMUCIL) 58.6 % powder Take 1 packet by mouth daily.    . rivaroxaban (XARELTO) 15 MG TABS tablet Take 1 tablet (15 mg total) by mouth daily with supper. 30 tablet 1  . rosuvastatin (CRESTOR) 20 MG tablet Take 10 mg by mouth daily.    Marland Kitchen triamcinolone cream (KENALOG) 0.1 % Apply 1 application topically as needed (dry skin irritation).     . vitamin B-12 (CYANOCOBALAMIN) 1000 MCG tablet Take 1,000 mcg by mouth daily.     Marland Kitchen ZETIA 10 MG tablet Take 10 mg by mouth Daily.     No current facility-administered medications for this visit.      Physical Exam:   BP 160/70 mmHg  Pulse 52  Resp 16  Ht 6\' 1"  (1.854 m)  Wt 244 lb (110.678 kg)  BMI 32.20 kg/m2  SpO2 99%  General:  Moderately obese but well appearing  Chest:   Clear to auscultation  CV:   Regular rate and rhythm without murmur  Incisions:  n/a  Abdomen:  Soft nontender  Extremities:  Warm and well-perfused  Diagnostic Tests:  2 channel telemetry rhythm strip demonstrates normal sinus rhythm.   CT ANGIOGRAPHY CHEST WITH CONTRAST  TECHNIQUE: Multidetector CT imaging of the chest was performed using the standard protocol during bolus administration of intravenous contrast. Multiplanar CT image reconstructions and MIPs were obtained to evaluate the vascular anatomy.  CONTRAST: 75 mL Isovue 370  COMPARISON:  10/25/2014  FINDINGS: Ascending thoracic aorta measures 4.5 cm, unchanged from prior study. No evidence of dissection. Plaque descending thoracic aorta similar to prior studies. Moderate thoracic aortic calcification again identified. Extensive coronary artery calcification. Patient is status post CABG. Origins of the major vessels off of the aorta all patent with varying degrees of calcification.  Stable 12 mm sub carinal lymph node with no other significant adenopathy. Precarinal lymph node measures 9 mm and shows normal fatty hilum.  No pleural effusion. No significant pulmonary parenchymal opacification.  No acute musculoskeletal findings.  Review of the MIP images confirms the above findings.  IMPRESSION: Stable ascending thoracic aortic aneurysm measuring 4.5 cm not significantly changed when compared to prior studies. Ascending thoracic aortic aneurysm. Recommend semi-annual imaging followup by CTA or MRA and referral to cardiothoracic surgery if not already obtained. This recommendation follows 2010 ACCF/AHA/AATS/ACR/ASA/SCA/SCAI/SIR/STS/SVM Guidelines for the Diagnosis and Management of Patients With Thoracic Aortic Disease. Circulation. 2010; 121SP:1689793   Electronically Signed  By: Skipper Cliche M.D.  On: 04/25/2015 15:44   Impression:  Stable mild aneurysmal enlargement of the proximal ascending thoracic aorta with maximum transverse diameter stable at 4.5 cm, unchanged over the past 2 years.  The patient's hypertension does not appear to be adequately controlled, particular given the presence of an aneurysm of the ascending thoracic aorta.  Plan:  We will obtain follow-up CT angiogram in 1 year. We have not recommended any changes to the patient's current medications at this time. However, the patient has been instructed to continue to monitor his blood pressure closely and to follow-up with Dr. Wynonia Lawman and Dr. Joylene Draft so that medical management  can be adjusted to bring it under better control.  I spent in excess of 10 minutes during the conduct of this office consultation and >50% of this time involved direct face-to-face encounter with the patient for counseling and/or coordination of their care.   Valentina Gu. Roxy Manns, MD 04/25/2015 4:38 PM

## 2015-05-26 ENCOUNTER — Ambulatory Visit (INDEPENDENT_AMBULATORY_CARE_PROVIDER_SITE_OTHER): Payer: Medicare Other

## 2015-05-26 DIAGNOSIS — T63441D Toxic effect of venom of bees, accidental (unintentional), subsequent encounter: Secondary | ICD-10-CM

## 2015-06-06 ENCOUNTER — Other Ambulatory Visit: Payer: Self-pay | Admitting: Pediatrics

## 2015-06-06 MED ORDER — EPINEPHRINE 0.3 MG/0.3ML IJ SOAJ
INTRAMUSCULAR | Status: DC
Start: 2015-06-06 — End: 2018-08-08

## 2015-06-06 NOTE — Telephone Encounter (Signed)
Spoke to patient advised we would send in epipen with no refills needs office visit states he has not had any issues in 30 years why does he need an office visit now advised that he has to be seen at least once a year and we need to have it documented patient verbalizes understanding.

## 2015-06-06 NOTE — Telephone Encounter (Addendum)
Called patient unable to leave message; will send in 1 pack with no refills needs office visit last office visit 2004

## 2015-06-06 NOTE — Telephone Encounter (Signed)
Pt called and said that his epi-pen has expired and needs a new one called in to [facility].

## 2015-06-29 DIAGNOSIS — L219 Seborrheic dermatitis, unspecified: Secondary | ICD-10-CM | POA: Diagnosis not present

## 2015-06-29 DIAGNOSIS — L814 Other melanin hyperpigmentation: Secondary | ICD-10-CM | POA: Diagnosis not present

## 2015-06-29 DIAGNOSIS — D235 Other benign neoplasm of skin of trunk: Secondary | ICD-10-CM | POA: Diagnosis not present

## 2015-07-20 DIAGNOSIS — I4891 Unspecified atrial fibrillation: Secondary | ICD-10-CM | POA: Diagnosis not present

## 2015-07-20 DIAGNOSIS — I259 Chronic ischemic heart disease, unspecified: Secondary | ICD-10-CM | POA: Diagnosis not present

## 2015-07-20 DIAGNOSIS — I5021 Acute systolic (congestive) heart failure: Secondary | ICD-10-CM | POA: Diagnosis not present

## 2015-07-20 DIAGNOSIS — G4733 Obstructive sleep apnea (adult) (pediatric): Secondary | ICD-10-CM | POA: Diagnosis not present

## 2015-07-20 DIAGNOSIS — I10 Essential (primary) hypertension: Secondary | ICD-10-CM | POA: Diagnosis not present

## 2015-07-22 DIAGNOSIS — T63441D Toxic effect of venom of bees, accidental (unintentional), subsequent encounter: Secondary | ICD-10-CM | POA: Diagnosis not present

## 2015-07-25 DIAGNOSIS — T63441D Toxic effect of venom of bees, accidental (unintentional), subsequent encounter: Secondary | ICD-10-CM | POA: Diagnosis not present

## 2015-07-26 ENCOUNTER — Telehealth: Payer: Self-pay | Admitting: Pediatrics

## 2015-07-26 ENCOUNTER — Ambulatory Visit (INDEPENDENT_AMBULATORY_CARE_PROVIDER_SITE_OTHER): Payer: Medicare Other | Admitting: *Deleted

## 2015-07-26 DIAGNOSIS — T63441D Toxic effect of venom of bees, accidental (unintentional), subsequent encounter: Secondary | ICD-10-CM | POA: Diagnosis not present

## 2015-07-26 NOTE — Telephone Encounter (Signed)
Patient has a question about a bill received. Pls cal back

## 2015-07-26 NOTE — Progress Notes (Signed)
Patient was given Cat-Dog (Red 1:100) 0.10 cc in error. Patient was informed of error and did not have any reactions. Per Dr. Neldon Mc as long as patient was not showing any signs of an reaction may go ahead and give injection. Patient agreed.

## 2015-07-26 NOTE — Telephone Encounter (Signed)
PT'S CHECK SHOWS AS A DEBIT ON HIS ACC'T - I EXPLAINED THAT WHEN WE SCAN HIS CHECK, THAT IT LOOKS LIKE A DEBIT - HE IS HAVING HIS BANK CHALLENGE THIS PMT

## 2015-08-17 DIAGNOSIS — I10 Essential (primary) hypertension: Secondary | ICD-10-CM | POA: Diagnosis not present

## 2015-08-17 DIAGNOSIS — E784 Other hyperlipidemia: Secondary | ICD-10-CM | POA: Diagnosis not present

## 2015-08-17 DIAGNOSIS — R7301 Impaired fasting glucose: Secondary | ICD-10-CM | POA: Diagnosis not present

## 2015-08-17 DIAGNOSIS — Z125 Encounter for screening for malignant neoplasm of prostate: Secondary | ICD-10-CM | POA: Diagnosis not present

## 2015-08-17 DIAGNOSIS — M109 Gout, unspecified: Secondary | ICD-10-CM | POA: Diagnosis not present

## 2015-08-24 DIAGNOSIS — I2510 Atherosclerotic heart disease of native coronary artery without angina pectoris: Secondary | ICD-10-CM | POA: Diagnosis not present

## 2015-08-24 DIAGNOSIS — F0390 Unspecified dementia without behavioral disturbance: Secondary | ICD-10-CM | POA: Insufficient documentation

## 2015-08-24 DIAGNOSIS — Z0000 Encounter for general adult medical examination without abnormal findings: Secondary | ICD-10-CM | POA: Diagnosis not present

## 2015-08-24 DIAGNOSIS — N183 Chronic kidney disease, stage 3 (moderate): Secondary | ICD-10-CM | POA: Diagnosis not present

## 2015-08-24 DIAGNOSIS — K210 Gastro-esophageal reflux disease with esophagitis: Secondary | ICD-10-CM | POA: Diagnosis not present

## 2015-08-24 DIAGNOSIS — D126 Benign neoplasm of colon, unspecified: Secondary | ICD-10-CM | POA: Insufficient documentation

## 2015-08-24 DIAGNOSIS — E784 Other hyperlipidemia: Secondary | ICD-10-CM | POA: Diagnosis not present

## 2015-08-24 DIAGNOSIS — I480 Paroxysmal atrial fibrillation: Secondary | ICD-10-CM | POA: Diagnosis not present

## 2015-08-24 DIAGNOSIS — G4733 Obstructive sleep apnea (adult) (pediatric): Secondary | ICD-10-CM | POA: Diagnosis not present

## 2015-08-24 DIAGNOSIS — I208 Other forms of angina pectoris: Secondary | ICD-10-CM | POA: Diagnosis not present

## 2015-08-24 DIAGNOSIS — R3129 Other microscopic hematuria: Secondary | ICD-10-CM | POA: Diagnosis not present

## 2015-08-29 DIAGNOSIS — I2510 Atherosclerotic heart disease of native coronary artery without angina pectoris: Secondary | ICD-10-CM | POA: Diagnosis not present

## 2015-08-29 DIAGNOSIS — I10 Essential (primary) hypertension: Secondary | ICD-10-CM | POA: Diagnosis not present

## 2015-09-20 ENCOUNTER — Ambulatory Visit (INDEPENDENT_AMBULATORY_CARE_PROVIDER_SITE_OTHER): Payer: Medicare Other

## 2015-09-20 DIAGNOSIS — T63441D Toxic effect of venom of bees, accidental (unintentional), subsequent encounter: Secondary | ICD-10-CM

## 2015-10-01 DIAGNOSIS — Z23 Encounter for immunization: Secondary | ICD-10-CM | POA: Diagnosis not present

## 2015-11-03 DIAGNOSIS — L309 Dermatitis, unspecified: Secondary | ICD-10-CM | POA: Diagnosis not present

## 2015-11-15 ENCOUNTER — Ambulatory Visit (INDEPENDENT_AMBULATORY_CARE_PROVIDER_SITE_OTHER): Payer: Medicare Other | Admitting: *Deleted

## 2015-11-15 DIAGNOSIS — T63441D Toxic effect of venom of bees, accidental (unintentional), subsequent encounter: Secondary | ICD-10-CM | POA: Diagnosis not present

## 2015-12-19 ENCOUNTER — Telehealth: Payer: Self-pay | Admitting: *Deleted

## 2015-12-19 NOTE — Telephone Encounter (Signed)
Pt will pay what he owes - I will ask Amy J to adjust NSF fee

## 2015-12-19 NOTE — Telephone Encounter (Signed)
Pt would like a return call. Has a question regarding his account

## 2015-12-29 DIAGNOSIS — H04123 Dry eye syndrome of bilateral lacrimal glands: Secondary | ICD-10-CM | POA: Diagnosis not present

## 2015-12-29 DIAGNOSIS — Z961 Presence of intraocular lens: Secondary | ICD-10-CM | POA: Diagnosis not present

## 2016-01-10 ENCOUNTER — Ambulatory Visit (INDEPENDENT_AMBULATORY_CARE_PROVIDER_SITE_OTHER): Payer: Medicare Other | Admitting: *Deleted

## 2016-01-10 DIAGNOSIS — T63441D Toxic effect of venom of bees, accidental (unintentional), subsequent encounter: Secondary | ICD-10-CM

## 2016-01-16 DIAGNOSIS — B009 Herpesviral infection, unspecified: Secondary | ICD-10-CM | POA: Diagnosis not present

## 2016-02-27 DIAGNOSIS — R011 Cardiac murmur, unspecified: Secondary | ICD-10-CM | POA: Insufficient documentation

## 2016-02-27 DIAGNOSIS — I712 Thoracic aortic aneurysm, without rupture: Secondary | ICD-10-CM | POA: Diagnosis not present

## 2016-02-27 DIAGNOSIS — R7301 Impaired fasting glucose: Secondary | ICD-10-CM | POA: Diagnosis not present

## 2016-02-27 DIAGNOSIS — I2510 Atherosclerotic heart disease of native coronary artery without angina pectoris: Secondary | ICD-10-CM | POA: Diagnosis not present

## 2016-02-27 DIAGNOSIS — F0390 Unspecified dementia without behavioral disturbance: Secondary | ICD-10-CM | POA: Diagnosis not present

## 2016-03-05 ENCOUNTER — Telehealth: Payer: Self-pay | Admitting: Pediatrics

## 2016-03-05 DIAGNOSIS — I25119 Atherosclerotic heart disease of native coronary artery with unspecified angina pectoris: Secondary | ICD-10-CM | POA: Diagnosis not present

## 2016-03-05 DIAGNOSIS — R0609 Other forms of dyspnea: Secondary | ICD-10-CM | POA: Diagnosis not present

## 2016-03-05 NOTE — Telephone Encounter (Signed)
Emailed Amy J to check his account

## 2016-03-05 NOTE — Telephone Encounter (Signed)
Please call patient back regarding payment made via mail. He said his checks are being processed by [place] but his bank is stating that the check did not go through. He said the funds are in his bank account and this is the 2nd time this has happened. Please call patient back. Thanks.

## 2016-03-06 ENCOUNTER — Ambulatory Visit (INDEPENDENT_AMBULATORY_CARE_PROVIDER_SITE_OTHER): Payer: Medicare Other | Admitting: *Deleted

## 2016-03-06 DIAGNOSIS — T63441D Toxic effect of venom of bees, accidental (unintentional), subsequent encounter: Secondary | ICD-10-CM | POA: Diagnosis not present

## 2016-03-06 NOTE — Telephone Encounter (Signed)
I advised pt that payment had been stopped on his payment - he will call bank - he thinks they may have done it because he asked them to the first time - he knows he will still owe this bill

## 2016-03-08 ENCOUNTER — Other Ambulatory Visit: Payer: Self-pay | Admitting: *Deleted

## 2016-03-08 DIAGNOSIS — I712 Thoracic aortic aneurysm, without rupture, unspecified: Secondary | ICD-10-CM

## 2016-04-30 ENCOUNTER — Encounter: Payer: Self-pay | Admitting: Thoracic Surgery (Cardiothoracic Vascular Surgery)

## 2016-04-30 ENCOUNTER — Ambulatory Visit
Admission: RE | Admit: 2016-04-30 | Discharge: 2016-04-30 | Disposition: A | Discharge status: Home / Self Care | Payer: Medicare Other | Source: Ambulatory Visit | Attending: Thoracic Surgery (Cardiothoracic Vascular Surgery) | Admitting: Thoracic Surgery (Cardiothoracic Vascular Surgery)

## 2016-04-30 ENCOUNTER — Ambulatory Visit (INDEPENDENT_AMBULATORY_CARE_PROVIDER_SITE_OTHER): Payer: Medicare Other | Admitting: Thoracic Surgery (Cardiothoracic Vascular Surgery)

## 2016-04-30 VITALS — BP 130/65 | HR 64 | Resp 20 | Ht 73.0 in | Wt 244.0 lb

## 2016-04-30 DIAGNOSIS — I712 Thoracic aortic aneurysm, without rupture, unspecified: Secondary | ICD-10-CM

## 2016-04-30 DIAGNOSIS — Z8679 Personal history of other diseases of the circulatory system: Secondary | ICD-10-CM

## 2016-04-30 DIAGNOSIS — Z9889 Other specified postprocedural states: Secondary | ICD-10-CM

## 2016-04-30 DIAGNOSIS — I77810 Thoracic aortic ectasia: Secondary | ICD-10-CM | POA: Diagnosis not present

## 2016-04-30 DIAGNOSIS — I7121 Aneurysm of the ascending aorta, without rupture: Secondary | ICD-10-CM

## 2016-04-30 MED ORDER — IOPAMIDOL (ISOVUE-370) INJECTION 76%
75.0000 mL | Freq: Once | INTRAVENOUS | Status: AC | PRN
  Administered 2016-04-30: 75 mL via INTRAVENOUS

## 2016-04-30 NOTE — Progress Notes (Signed)
[address]       [place], [id number]             [phone]     CARDIOTHORACIC SURGERY OFFICE NOTE  Referring Provider is Jacolyn Reedy, MD PCP is [name], MD   HPI:  Patient is an 83 year old male with history of coronary artery disease, hypertension, atrial fibrillation, hyperlipidemia, and type 2 diabetes mellitus who returns to the office today for follow-up and surveillance of mild aneurysmal enlargement of the ascending thoracic aorta.  He underwent coronary artery bypass grafting 3 and Maze procedure on 03/06/2012 for severe three-vessel coronary artery disease and recurrent paroxysmal atrial fibrillation.  He was noted to have mild aneurysmal enlargement of the ascending thoracic aorta at that time. He has been followed regularly ever since and was most recently seen in our office on 04/25/2015 at which time he was doing well and maintaining sinus rhythm. Since then the patient has continued to do well. He states that he was seen approximately one month ago by Dr. Wynonia Lawman. He does report occasional episodes of burning substernal chest discomfort that occur only at night. He occasionally takes a nitroglycerin tablet for this and the symptoms resolve. He denies any symptoms of chest discomfort during the day and he is fairly active physically. He reports stable symptoms of mild exertional shortness of breath. Exertional shortness of breath limits in to some degree but he is very comfortable at rest and overall he is able to take care of all of his daily needs without any significant problems. He has not had any palpitations to suggest a recurrence of atrial fibrillation.  He remains chronically anticoagulated using Xarelto.  He has not had any transient neurologic events or bleeding complications.   Current Outpatient Prescriptions  Medication Sig Dispense Refill  . allopurinol (ZYLOPRIM) 300 MG tablet Take 150 mg by mouth Daily.    Marland Kitchen atenolol (TENORMIN)  50 MG tablet Take 12.5 mg by mouth daily.   3  . cholecalciferol (VITAMIN D) 1000 UNITS tablet Take 1,000 Units by mouth daily.    . DiphenhydrAMINE HCl (BENADRYL ALLERGY PO) Take 25 mg by mouth as needed.     Marland Kitchen EPINEPHrine (EPIPEN 2-PAK) 0.3 mg/0.3 mL IJ SOAJ injection Use as directed for severe allergic reaction 1 Device 0  . fish oil-omega-3 fatty acids 1000 MG capsule Take 1 g by mouth daily.     . furosemide (LASIX) 40 MG tablet Take 20 mg by mouth daily.    Marland Kitchen galantamine (RAZADYNE ER) 8 MG 24 hr capsule Take 8 mg by mouth Daily.    . HONEY BEE VENOM IJ Inject as directed.    Marland Kitchen NITROSTAT 0.4 MG SL tablet Place 1 tablet under the tongue every 5 (five) minutes x 3 doses as needed for chest pain.     . pantoprazole (PROTONIX) 40 MG tablet Take 40 mg by mouth daily.  4  . psyllium (METAMUCIL) 58.6 % powder Take 1 packet by mouth daily.    . rivaroxaban (XARELTO) 15 MG TABS tablet Take 1 tablet (15 mg total) by mouth daily with supper. 30 tablet 1  . rosuvastatin (CRESTOR) 20 MG tablet Take 10 mg by mouth daily.    Marland Kitchen triamcinolone cream (KENALOG) 0.1 % Apply 1 application topically as needed (dry skin irritation).     . vitamin B-12 (CYANOCOBALAMIN) 1000 MCG tablet Take 1,000 mcg by mouth daily.     Marland Kitchen ZETIA 10 MG tablet Take  10 mg by mouth Daily.     No current facility-administered medications for this visit.       Physical Exam:   BP 130/65   Pulse 64   Resp 20   Ht 6\' 1"  (1.854 m)   Wt 244 lb (110.7 kg)   SpO2 98% Comment: RA  BMI 32.19 kg/m   General:  Well-appearing, looks younger than stated age  Chest:   Clear to auscultation  CV:   Regular rate and rhythm  Incisions:  n/a  Abdomen:  Soft nontender  Extremities:  Warm and well-perfused  Diagnostic Tests:  CT ANGIOGRAPHY CHEST WITH CONTRAST  TECHNIQUE: Multidetector CT imaging of the chest was performed using the standard protocol during bolus administration of intravenous contrast. Multiplanar CT image  reconstructions and MIPs were obtained to evaluate the vascular anatomy.  CONTRAST:  75 cc Isovue 370.  Creatinine was obtained on site at [place] at [address]  Results: Creatinine 1.4 mg/dL.  COMPARISON:  04/25/2015.  FINDINGS: Cardiovascular: Atherosclerotic calcification of the arterial vasculature. Ascending aorta measures up to 4.3 cm (coronal image 58), stable. There are aortic valvular calcifications. Heart is at the upper limits of normal in size. No pericardial effusion.  Mediastinum/Nodes: No pathologically enlarged mediastinal, hilar or axillary lymph nodes. Esophagus is grossly unremarkable.  Lungs/Pleura: Mild biapical pleuroparenchymal scarring. There may be mucoid impaction in the left lower lobe (image 103), similar. A few scattered millimetric nodular densities are unchanged. No pleural fluid. Airway is unremarkable.  Upper Abdomen: Visualized portions of the liver and adrenal glands are unremarkable. Cholecystectomy. Low-attenuation lesions in the kidneys measure up to 3.1 cm on the right, stable and likely cysts. Visualized portions of the spleen, pancreas, stomach and bowel are grossly unremarkable. No upper abdominal adenopathy.  Musculoskeletal: No worrisome lytic or sclerotic lesions. Degenerative changes are seen in the spine.  Review of the MIP images confirms the above findings.  IMPRESSION: 1. Ascending thoracic aortic aneurysm, stable. Recommend annual imaging followup by CTA or MRA. This recommendation follows 2010 ACCF/AHA/AATS/ACR/ASA/SCA/SCAI/SIR/STS/SVM Guidelines for the Diagnosis and Management of Patients with Thoracic Aortic Disease. Circulation. 2010; 121: T245-Y099. 2.  Aortic atherosclerosis (ICD10-170.0).   Electronically Signed   By: Lorin Picket M.D.   On: 04/30/2016 13:32   Impression:  Stable radiographic appearance of mild aneurysmal enlargement of the ascending thoracic aorta.   The patient appears to be maintaining sinus rhythm. He reports stable symptoms of exertional shortness breath. He has had some occasional episodes of nocturnal chest discomfort for which she takes sublingual nitroglycerin.  Plan:  We plan follow-up CT angiogram in 1 year.  I have advised the patient to contact Dr. Thurman Coyer office and return for follow-up if he continues to have frequent episodes of nocturnal chest discomfort or if he develops any chest pain unrelieved by nitroglycerin. We have not recommended any changes to his current medications. All of his questions have been addressed.  I spent in excess of 15 minutes during the conduct of this office consultation and >50% of this time involved direct face-to-face encounter with the patient for counseling and/or coordination of their care.    Valentina Gu. Roxy Manns, MD 04/30/2016 4:17 PM

## 2016-04-30 NOTE — Patient Instructions (Signed)
Continue all previous medications without any changes at this time  

## 2016-05-07 ENCOUNTER — Ambulatory Visit (INDEPENDENT_AMBULATORY_CARE_PROVIDER_SITE_OTHER): Payer: Medicare Other

## 2016-05-07 DIAGNOSIS — T63441D Toxic effect of venom of bees, accidental (unintentional), subsequent encounter: Secondary | ICD-10-CM

## 2016-07-05 ENCOUNTER — Ambulatory Visit (INDEPENDENT_AMBULATORY_CARE_PROVIDER_SITE_OTHER): Payer: Medicare Other | Admitting: *Deleted

## 2016-07-05 DIAGNOSIS — T63441D Toxic effect of venom of bees, accidental (unintentional), subsequent encounter: Secondary | ICD-10-CM | POA: Diagnosis not present

## 2016-07-11 DIAGNOSIS — R05 Cough: Secondary | ICD-10-CM | POA: Diagnosis not present

## 2016-07-11 DIAGNOSIS — I4510 Unspecified right bundle-branch block: Secondary | ICD-10-CM | POA: Diagnosis not present

## 2016-07-11 DIAGNOSIS — I209 Angina pectoris, unspecified: Secondary | ICD-10-CM | POA: Diagnosis not present

## 2016-07-11 DIAGNOSIS — R0600 Dyspnea, unspecified: Secondary | ICD-10-CM | POA: Diagnosis not present

## 2016-07-13 DIAGNOSIS — I2510 Atherosclerotic heart disease of native coronary artery without angina pectoris: Secondary | ICD-10-CM | POA: Diagnosis not present

## 2016-07-13 DIAGNOSIS — I10 Essential (primary) hypertension: Secondary | ICD-10-CM | POA: Diagnosis not present

## 2016-07-13 DIAGNOSIS — I712 Thoracic aortic aneurysm, without rupture: Secondary | ICD-10-CM | POA: Diagnosis not present

## 2016-07-13 DIAGNOSIS — I4510 Unspecified right bundle-branch block: Secondary | ICD-10-CM | POA: Diagnosis not present

## 2016-08-29 DIAGNOSIS — I2510 Atherosclerotic heart disease of native coronary artery without angina pectoris: Secondary | ICD-10-CM | POA: Diagnosis not present

## 2016-08-29 DIAGNOSIS — I10 Essential (primary) hypertension: Secondary | ICD-10-CM | POA: Diagnosis not present

## 2016-08-29 DIAGNOSIS — I480 Paroxysmal atrial fibrillation: Secondary | ICD-10-CM | POA: Diagnosis not present

## 2016-08-29 DIAGNOSIS — H538 Other visual disturbances: Secondary | ICD-10-CM | POA: Diagnosis not present

## 2016-08-30 ENCOUNTER — Ambulatory Visit (HOSPITAL_COMMUNITY)
Admission: RE | Admit: 2016-08-30 | Discharge: 2016-08-30 | Disposition: A | Discharge status: Home / Self Care | Payer: Medicare Other | Source: Ambulatory Visit | Attending: Vascular Surgery | Admitting: Vascular Surgery

## 2016-08-30 ENCOUNTER — Other Ambulatory Visit (HOSPITAL_COMMUNITY): Payer: Self-pay | Admitting: Internal Medicine

## 2016-08-30 DIAGNOSIS — G453 Amaurosis fugax: Secondary | ICD-10-CM | POA: Diagnosis not present

## 2016-08-30 DIAGNOSIS — Z961 Presence of intraocular lens: Secondary | ICD-10-CM | POA: Diagnosis not present

## 2016-08-30 DIAGNOSIS — I6523 Occlusion and stenosis of bilateral carotid arteries: Secondary | ICD-10-CM | POA: Diagnosis not present

## 2016-08-30 DIAGNOSIS — H40013 Open angle with borderline findings, low risk, bilateral: Secondary | ICD-10-CM | POA: Diagnosis not present

## 2016-08-30 DIAGNOSIS — H53122 Transient visual loss, left eye: Secondary | ICD-10-CM | POA: Diagnosis not present

## 2016-08-30 DIAGNOSIS — H43811 Vitreous degeneration, right eye: Secondary | ICD-10-CM | POA: Diagnosis not present

## 2016-08-30 LAB — VAS US CAROTID
LEFT VERTEBRAL DIAS: -10 cm/s
LICAPDIAS: -12 cm/s
LICAPSYS: -95 cm/s
Left CCA dist dias: 18 cm/s
Left CCA dist sys: 123 cm/s
Left CCA prox dias: 18 cm/s
Left CCA prox sys: 111 cm/s
RCCAPDIAS: 18 cm/s
RCCAPSYS: 103 cm/s
RIGHT CCA MID DIAS: 14 cm/s
RIGHT VERTEBRAL DIAS: -5 cm/s
Right cca dist sys: -94 cm/s

## 2016-09-05 DIAGNOSIS — I5021 Acute systolic (congestive) heart failure: Secondary | ICD-10-CM | POA: Diagnosis not present

## 2016-09-05 DIAGNOSIS — I4891 Unspecified atrial fibrillation: Secondary | ICD-10-CM | POA: Diagnosis not present

## 2016-09-05 DIAGNOSIS — G4733 Obstructive sleep apnea (adult) (pediatric): Secondary | ICD-10-CM | POA: Diagnosis not present

## 2016-09-05 DIAGNOSIS — I259 Chronic ischemic heart disease, unspecified: Secondary | ICD-10-CM | POA: Diagnosis not present

## 2016-09-06 ENCOUNTER — Ambulatory Visit (INDEPENDENT_AMBULATORY_CARE_PROVIDER_SITE_OTHER): Payer: Medicare Other | Admitting: *Deleted

## 2016-09-06 DIAGNOSIS — T63441D Toxic effect of venom of bees, accidental (unintentional), subsequent encounter: Secondary | ICD-10-CM

## 2016-09-06 DIAGNOSIS — H538 Other visual disturbances: Secondary | ICD-10-CM | POA: Diagnosis not present

## 2016-09-26 DIAGNOSIS — Z23 Encounter for immunization: Secondary | ICD-10-CM | POA: Diagnosis not present

## 2016-10-08 DIAGNOSIS — M109 Gout, unspecified: Secondary | ICD-10-CM | POA: Diagnosis not present

## 2016-10-08 DIAGNOSIS — E538 Deficiency of other specified B group vitamins: Secondary | ICD-10-CM | POA: Diagnosis not present

## 2016-10-08 DIAGNOSIS — I10 Essential (primary) hypertension: Secondary | ICD-10-CM | POA: Diagnosis not present

## 2016-10-08 DIAGNOSIS — Z0000 Encounter for general adult medical examination without abnormal findings: Secondary | ICD-10-CM | POA: Diagnosis not present

## 2016-10-08 DIAGNOSIS — E7849 Other hyperlipidemia: Secondary | ICD-10-CM | POA: Diagnosis not present

## 2016-10-09 ENCOUNTER — Encounter: Payer: Self-pay | Admitting: Adult Health

## 2016-10-11 ENCOUNTER — Ambulatory Visit (INDEPENDENT_AMBULATORY_CARE_PROVIDER_SITE_OTHER): Payer: Medicare Other | Admitting: Adult Health

## 2016-10-11 ENCOUNTER — Encounter: Payer: Self-pay | Admitting: Adult Health

## 2016-10-11 ENCOUNTER — Ambulatory Visit: Payer: Medicare Other | Admitting: Pulmonary Disease

## 2016-10-11 DIAGNOSIS — E669 Obesity, unspecified: Secondary | ICD-10-CM

## 2016-10-11 DIAGNOSIS — G4733 Obstructive sleep apnea (adult) (pediatric): Secondary | ICD-10-CM | POA: Diagnosis not present

## 2016-10-11 NOTE — Progress Notes (Signed)
@Patient  ID: [name] [name], male    DOB: 1926-07-09, 83 y.o.   MRN: [id number]  Chief Complaint  Patient presents with  . Follow-up    OSA    Referring provider: [name], MD  HPI: 83 year old male followed for obstructive sleep apnea on C Pap at bedtime  Tests Echo 03/10/12 >> EF 45 to 50% Echo 10/25/14 >> mod LVH, EF 60 to 65%, mild/mod AR, mod LA dilation, PAS 47 mmHg PSG 07/29/14 >> AHI 29.5, SaO2 low 88%.  CPAP 12 >> AHI 0 CPAP 01/13/15 to 04/12/15 >> used on 80 of 90 nights with average 7 hrs 19 min.  Average AHI 10.7 with CPAP 12 cm H2O  10/11/2016 Follow up : OSA  Patient returns for a follow-up for sleep apnea. Patient says he wears his C Pap every single night for about 8 hours. He still feels that he has too much pressure at times with his mask. He is on  C Pap 13 cm H2O.  Mask is >66 year old , loses seal.  Discussed that he needs to change supplies more often.  Patient feels rested. Denies any excessive daytime sleepiness.    Allergies  Allergen Reactions  . Amoxicillin Other (See Comments)    Headache, debilitated  . Bee Venom Anaphylaxis  . Morphine And Related Other (See Comments)    hypotension    Immunization History  Administered Date(s) Administered  . Influenza, High Dose Seasonal PF 09/20/2016  . Influenza,inj,Quad PF,6+ Mos 10/29/2014    Past Medical History:  Diagnosis Date  . Acute coronary syndrome ([facility]) 02/29/2012  . Acute systolic heart failure ([facility]) 03/03/2012  . Angina   . Ascending aorta dilatation ([facility]) 03/13/2012   Fusiform dilatation of the ascending thoracic aorta discovered during surgery, max diameter 4.2-4.3 cm  . Atrial fibrillation ([facility]) 02/29/2012   Recurrent paroxysmal, new-onset  . BPH (benign prostatic hypertrophy)   . CAD (coronary artery disease)    Cath April 2012 40% left main, 50% LAD, 40% OM, occluded RCA with L-R collaterals   . Chronic venous insufficiency   . Glaucoma   . Gout   . Hyperlipidemia   . Hypertension    . Kidney stones   . Obesity (BMI 30-39.9)   . S/P CABG x 3 03/06/2012   LIMA to LAD, SVG to OM, SVG to RCA, EVH via right thigh  . S/P Maze operation for atrial fibrillation 03/06/2012   Complete bilateral lesions set using bipolar radiofrequency and cryothermy ablation with clipping of LA appendage    Tobacco History: History  Smoking Status  . Former Smoker  . Quit date: 11/18/1962  Smokeless Tobacco  . Never Used   Counseling given: Not Answered   Outpatient Encounter Prescriptions as of 10/11/2016  Medication Sig  . allopurinol (ZYLOPRIM) 300 MG tablet Take 150 mg by mouth Daily.  . cholecalciferol (VITAMIN D) 1000 UNITS tablet Take 1,000 Units by mouth daily.  . DiphenhydrAMINE HCl (BENADRYL ALLERGY PO) Take 25 mg by mouth as needed.   Marland Kitchen EPINEPHrine (EPIPEN 2-PAK) 0.3 mg/0.3 mL IJ SOAJ injection Use as directed for severe allergic reaction  . fish oil-omega-3 fatty acids 1000 MG capsule Take 1 g by mouth daily.   . furosemide (LASIX) 40 MG tablet Take 20 mg by mouth daily.  Marland Kitchen galantamine (RAZADYNE ER) 8 MG 24 hr capsule Take 8 mg by mouth Daily.  . HONEY BEE VENOM IJ Inject as directed.  Marland Kitchen NITROSTAT 0.4 MG SL tablet Place 1 tablet  under the tongue every 5 (five) minutes x 3 doses as needed for chest pain.   . pantoprazole (PROTONIX) 40 MG tablet Take 40 mg by mouth daily.  . psyllium (METAMUCIL) 58.6 % powder Take 1 packet by mouth daily.  . rivaroxaban (XARELTO) 15 MG TABS tablet Take 1 tablet (15 mg total) by mouth daily with supper.  . rosuvastatin (CRESTOR) 20 MG tablet Take 10 mg by mouth daily.  Marland Kitchen triamcinolone cream (KENALOG) 0.1 % Apply 1 application topically as needed (dry skin irritation).   . vitamin B-12 (CYANOCOBALAMIN) 1000 MCG tablet Take 1,000 mcg by mouth daily.   Marland Kitchen ZETIA 10 MG tablet Take 10 mg by mouth Daily.  Marland Kitchen atenolol (TENORMIN) 50 MG tablet Take 12.5 mg by mouth daily.    No facility-administered encounter medications on file as of 10/11/2016.       Review of Systems  Constitutional:   No  weight loss, night sweats,  Fevers, chills, fatigue, or  lassitude.  HEENT:   No headaches,  Difficulty swallowing,  Tooth/dental problems, or  Sore throat,                No sneezing, itching, ear ache, nasal congestion, post nasal drip,   CV:  No chest pain,  Orthopnea, PND, swelling in lower extremities, anasarca, dizziness, palpitations, syncope.   GI  No heartburn, indigestion, abdominal pain, nausea, vomiting, diarrhea, change in bowel habits, loss of appetite, bloody stools.   Resp: No shortness of breath with exertion or at rest.  No excess mucus, no productive cough,  No non-productive cough,  No coughing up of blood.  No change in color of mucus.  No wheezing.  No chest wall deformity  Skin: no rash or lesions.  GU: no dysuria, change in color of urine, no urgency or frequency.  No flank pain, no hematuria   MS:  No joint pain or swelling.  No decreased range of motion.  No back pain.    Physical Exam  BP 132/80 (BP Location: Left Arm, Cuff Size: Normal)   Pulse (!) 58   Ht 6\' 1"  (1.854 m)   Wt 247 lb 9.6 oz (112.3 kg)   SpO2 97%   BMI 32.67 kg/m   GEN: A/Ox3; pleasant , NAD, obese ,    HEENT:  [place]/AT,  EACs-clear, TMs-wnl, NOSE-clear, THROAT-clear, no lesions, no postnasal drip or exudate noted. Class 2-3 MP airway    NECK:  Supple w/ fair ROM; no JVD; normal carotid impulses w/o bruits; no thyromegaly or nodules palpated; no lymphadenopathy.    RESP  Clear  P & A; w/o, wheezes/ rales/ or rhonchi. no accessory muscle use, no dullness to percussion  CARD:  RRR, no m/r/g, tr  peripheral edema, pulses intact, no cyanosis or clubbing.  GI:   Soft & nt; nml bowel sounds; no organomegaly or masses detected.   Musco: Warm bil, no deformities or joint swelling noted.   Neuro: alert, no focal deficits noted.    Skin: Warm, no lesions or rashes    Lab Results:    Imaging: No results found.   Assessment &  Plan:   OSA (obstructive sleep apnea) Well controlled , few residual events  Will change to auto set   Plan  Patient Instructions  Continue on CPAP At bedtime   Change to auto set CPAP to 8 to 15cm H2O  Download in 6 weeks .  Get new supplies .  Stay active , keep healthy weight .  Do  not drive if sleepy .  Follow up with Dr. Halford Chessman  In 1 year and As needed       Obesity (BMI 30-39.9) Wt loss      Rexene Edison, NP 10/11/2016

## 2016-10-11 NOTE — Progress Notes (Signed)
I have reviewed and agree with assessment/plan.  Chesley Mires, MD [facility] Pulmonary/Critical Care 10/11/2016, 8:11 PM Pager:  [phone]

## 2016-10-11 NOTE — Patient Instructions (Addendum)
Continue on CPAP At bedtime   Change to auto set CPAP to 8 to 15cm H2O  Download in 6 weeks .  Get new supplies .  Stay active , keep healthy weight .  Do not drive if sleepy .  Follow up with Dr. Halford Chessman  In 1 year and As needed

## 2016-10-11 NOTE — Assessment & Plan Note (Signed)
Well controlled , few residual events  Will change to auto set   Plan  Patient Instructions  Continue on CPAP At bedtime   Change to auto set CPAP to 8 to 15cm H2O  Download in 6 weeks .  Get new supplies .  Stay active , keep healthy weight .  Do not drive if sleepy .  Follow up with Dr. Halford Chessman  In 1 year and As needed

## 2016-10-11 NOTE — Assessment & Plan Note (Signed)
Wt loss  

## 2016-10-11 NOTE — Addendum Note (Signed)
Addended by: Parke Poisson E on: 10/11/2016 04:58 PM   Modules accepted: Orders

## 2016-10-15 DIAGNOSIS — Z0000 Encounter for general adult medical examination without abnormal findings: Secondary | ICD-10-CM | POA: Diagnosis not present

## 2016-10-15 DIAGNOSIS — F0390 Unspecified dementia without behavioral disturbance: Secondary | ICD-10-CM | POA: Diagnosis not present

## 2016-10-15 DIAGNOSIS — R011 Cardiac murmur, unspecified: Secondary | ICD-10-CM | POA: Diagnosis not present

## 2016-10-15 DIAGNOSIS — I4510 Unspecified right bundle-branch block: Secondary | ICD-10-CM | POA: Diagnosis not present

## 2016-10-16 ENCOUNTER — Telehealth: Payer: Self-pay | Admitting: Allergy and Immunology

## 2016-10-16 DIAGNOSIS — Z1212 Encounter for screening for malignant neoplasm of rectum: Secondary | ICD-10-CM | POA: Diagnosis not present

## 2016-10-16 NOTE — Telephone Encounter (Signed)
Mailed statement - kt

## 2016-10-16 NOTE — Telephone Encounter (Signed)
Patient misplaced his last billing statement and is asking for another to be mailed to him please. THanks

## 2016-10-19 DIAGNOSIS — I4891 Unspecified atrial fibrillation: Secondary | ICD-10-CM | POA: Diagnosis not present

## 2016-10-19 DIAGNOSIS — I5021 Acute systolic (congestive) heart failure: Secondary | ICD-10-CM | POA: Diagnosis not present

## 2016-10-19 DIAGNOSIS — I259 Chronic ischemic heart disease, unspecified: Secondary | ICD-10-CM | POA: Diagnosis not present

## 2016-10-19 DIAGNOSIS — G4733 Obstructive sleep apnea (adult) (pediatric): Secondary | ICD-10-CM | POA: Diagnosis not present

## 2016-10-23 DIAGNOSIS — I4891 Unspecified atrial fibrillation: Secondary | ICD-10-CM | POA: Diagnosis not present

## 2016-10-23 DIAGNOSIS — I5021 Acute systolic (congestive) heart failure: Secondary | ICD-10-CM | POA: Diagnosis not present

## 2016-10-23 DIAGNOSIS — G4733 Obstructive sleep apnea (adult) (pediatric): Secondary | ICD-10-CM | POA: Diagnosis not present

## 2016-10-23 DIAGNOSIS — I259 Chronic ischemic heart disease, unspecified: Secondary | ICD-10-CM | POA: Diagnosis not present

## 2016-11-01 ENCOUNTER — Ambulatory Visit (INDEPENDENT_AMBULATORY_CARE_PROVIDER_SITE_OTHER): Payer: Medicare Other

## 2016-11-01 DIAGNOSIS — T63441D Toxic effect of venom of bees, accidental (unintentional), subsequent encounter: Secondary | ICD-10-CM | POA: Diagnosis not present

## 2016-12-03 ENCOUNTER — Telehealth: Payer: Self-pay | Admitting: Adult Health

## 2016-12-03 DIAGNOSIS — G4733 Obstructive sleep apnea (adult) (pediatric): Secondary | ICD-10-CM

## 2016-12-03 NOTE — Telephone Encounter (Signed)
Called and spoke with pt. Pt states at last OV, it was discussed that cpap pressure to would changed to auto. Pt states his previous machine was not able to be adjusted to auto. Pt states he picked up new machine today that can be set to auto. Pt is requesting that order be placed to adjust setting. Per our records it appears that order was placed on 10/11/16 for auto setting.  I have spoken Evelena Peat with [facility], who states that cpap department has left for today.  Will call back on 12/04/16

## 2016-12-04 NOTE — Telephone Encounter (Signed)
Assessment & Plan:   OSA (obstructive sleep apnea) Well controlled , few residual events  Will change to auto set   Plan  Patient Instructions  Continue on CPAP At bedtime   Change to auto set CPAP to 8 to 15cm H2O  Download in 6 weeks .  Get new supplies .  Stay active , keep healthy weight .  Do not drive if sleepy .  Follow up with Dr. Halford Chessman  In 1 year and As needed    Order sent to [facility] for autoset pressure. Nothing further is needed. FYI PCC's

## 2016-12-04 NOTE — Telephone Encounter (Signed)
Order sent to [facility] per Judeen Hammans

## 2016-12-04 NOTE — Telephone Encounter (Signed)
[name] [name] from [facility] returning call concerning cpap settings .  Needs new order for auto pressure setting.  [name]

## 2016-12-28 ENCOUNTER — Ambulatory Visit (INDEPENDENT_AMBULATORY_CARE_PROVIDER_SITE_OTHER): Payer: Medicare Other

## 2016-12-28 DIAGNOSIS — T63441D Toxic effect of venom of bees, accidental (unintentional), subsequent encounter: Secondary | ICD-10-CM

## 2017-01-18 ENCOUNTER — Ambulatory Visit (INDEPENDENT_AMBULATORY_CARE_PROVIDER_SITE_OTHER): Payer: Medicare Other | Admitting: Podiatry

## 2017-01-18 ENCOUNTER — Ambulatory Visit (INDEPENDENT_AMBULATORY_CARE_PROVIDER_SITE_OTHER): Payer: Medicare Other

## 2017-01-18 ENCOUNTER — Encounter: Payer: Self-pay | Admitting: Podiatry

## 2017-01-18 DIAGNOSIS — L600 Ingrowing nail: Secondary | ICD-10-CM | POA: Diagnosis not present

## 2017-01-18 DIAGNOSIS — M722 Plantar fascial fibromatosis: Secondary | ICD-10-CM | POA: Diagnosis not present

## 2017-01-18 MED ORDER — TRIAMCINOLONE ACETONIDE 10 MG/ML IJ SUSP
10.0000 mg | Freq: Once | INTRAMUSCULAR | Status: AC
  Administered 2017-01-18: 10 mg

## 2017-01-18 NOTE — Patient Instructions (Signed)

## 2017-01-22 ENCOUNTER — Telehealth: Payer: Self-pay | Admitting: Adult Health

## 2017-01-22 NOTE — Progress Notes (Signed)
Subjective:   Patient ID: [name] [name], male   DOB: 84 y.o.   MRN: [id number]   HPI Patient presents with pain in the left hallux and also pain in the continued plantar fascial band right with pain at the insertion to the calcaneus.   ROS      Objective:  Physical Exam  Neurovascular status intact with irritated nail bed with distal tenderness and continued discomfort in the plantar fascia     Assessment:  Paronychia infection with ingrown toenail component and continue plantar fasciitis     Plan:  We will initiate soaks at the current time to try to reduce any inflammation and I did reinject the plantar fascia 3 mg Kenalog 5 mg Xylocaine patient will be checked back again in 3 weeks or earlier if needed

## 2017-01-22 NOTE — Telephone Encounter (Signed)
Per pt's chart, his CPAP settings were changed from set pressure 13cm to auto set at 8-15cm because it felt like his pressure was too strong at times.    6 week download requested and received  autoset 8-15cm AHI: 6.6  Reviewed by TP: better control.  Is this more comfortable?  If so, no changes in therapy.  Called spoke with patient and discussed download results Pt stated that he is much more comfortable on the auto settings  Nothing further needed at this time Download sent for scan  Will sign off

## 2017-01-29 DIAGNOSIS — I2510 Atherosclerotic heart disease of native coronary artery without angina pectoris: Secondary | ICD-10-CM | POA: Diagnosis not present

## 2017-01-29 DIAGNOSIS — I4510 Unspecified right bundle-branch block: Secondary | ICD-10-CM | POA: Diagnosis not present

## 2017-01-29 DIAGNOSIS — I872 Venous insufficiency (chronic) (peripheral): Secondary | ICD-10-CM | POA: Diagnosis not present

## 2017-01-29 DIAGNOSIS — I712 Thoracic aortic aneurysm, without rupture: Secondary | ICD-10-CM | POA: Diagnosis not present

## 2017-02-11 ENCOUNTER — Encounter: Payer: Self-pay | Admitting: Podiatry

## 2017-02-11 ENCOUNTER — Ambulatory Visit: Payer: Medicare Other | Admitting: Podiatry

## 2017-02-11 DIAGNOSIS — M722 Plantar fascial fibromatosis: Secondary | ICD-10-CM

## 2017-02-11 DIAGNOSIS — L600 Ingrowing nail: Secondary | ICD-10-CM | POA: Diagnosis not present

## 2017-02-11 NOTE — Progress Notes (Signed)
Subjective:   Patient ID: [name] [name], male   DOB: 84 y.o.   MRN: [id number]   HPI Patient states the right heel is improving quite a bit with discomfort still noted but a lot better than it was previously   ROS      Objective:  Physical Exam  Neurovascular status intact with patient's right heel doing better with pain still noted upon deep palpation but overall improved     Assessment:  Inflammatory fasciitis right improving with bracing and injection treatment     Plan:  Advised on physical therapy anti-inflammatories continued shoe gear usage and reappoint for [place] to recheck as needed

## 2017-02-14 DIAGNOSIS — R7301 Impaired fasting glucose: Secondary | ICD-10-CM | POA: Diagnosis not present

## 2017-02-14 DIAGNOSIS — M722 Plantar fascial fibromatosis: Secondary | ICD-10-CM | POA: Insufficient documentation

## 2017-02-14 DIAGNOSIS — I77819 Aortic ectasia, unspecified site: Secondary | ICD-10-CM | POA: Insufficient documentation

## 2017-02-14 DIAGNOSIS — F0390 Unspecified dementia without behavioral disturbance: Secondary | ICD-10-CM | POA: Diagnosis not present

## 2017-02-14 DIAGNOSIS — N183 Chronic kidney disease, stage 3 (moderate): Secondary | ICD-10-CM | POA: Diagnosis not present

## 2017-02-22 ENCOUNTER — Ambulatory Visit (INDEPENDENT_AMBULATORY_CARE_PROVIDER_SITE_OTHER): Payer: Medicare Other

## 2017-02-22 DIAGNOSIS — T63441D Toxic effect of venom of bees, accidental (unintentional), subsequent encounter: Secondary | ICD-10-CM | POA: Diagnosis not present

## 2017-03-19 ENCOUNTER — Other Ambulatory Visit: Payer: Self-pay | Admitting: *Deleted

## 2017-03-19 DIAGNOSIS — I712 Thoracic aortic aneurysm, without rupture, unspecified: Secondary | ICD-10-CM

## 2017-03-28 DIAGNOSIS — Z961 Presence of intraocular lens: Secondary | ICD-10-CM | POA: Diagnosis not present

## 2017-03-28 DIAGNOSIS — H1132 Conjunctival hemorrhage, left eye: Secondary | ICD-10-CM | POA: Diagnosis not present

## 2017-03-28 DIAGNOSIS — H04123 Dry eye syndrome of bilateral lacrimal glands: Secondary | ICD-10-CM | POA: Diagnosis not present

## 2017-04-19 ENCOUNTER — Ambulatory Visit: Payer: Medicare Other

## 2017-04-22 ENCOUNTER — Ambulatory Visit (INDEPENDENT_AMBULATORY_CARE_PROVIDER_SITE_OTHER): Payer: Medicare Other | Admitting: *Deleted

## 2017-04-22 DIAGNOSIS — T63441D Toxic effect of venom of bees, accidental (unintentional), subsequent encounter: Secondary | ICD-10-CM | POA: Diagnosis not present

## 2017-04-26 DIAGNOSIS — B0089 Other herpesviral infection: Secondary | ICD-10-CM | POA: Diagnosis not present

## 2017-04-29 ENCOUNTER — Encounter: Payer: Self-pay | Admitting: Thoracic Surgery (Cardiothoracic Vascular Surgery)

## 2017-04-29 ENCOUNTER — Other Ambulatory Visit: Payer: Self-pay

## 2017-04-29 ENCOUNTER — Ambulatory Visit
Admission: RE | Admit: 2017-04-29 | Discharge: 2017-04-29 | Disposition: A | Discharge status: Home / Self Care | Payer: Medicare Other | Source: Ambulatory Visit | Attending: Thoracic Surgery (Cardiothoracic Vascular Surgery) | Admitting: Thoracic Surgery (Cardiothoracic Vascular Surgery)

## 2017-04-29 ENCOUNTER — Ambulatory Visit: Payer: Medicare Other | Admitting: Thoracic Surgery (Cardiothoracic Vascular Surgery)

## 2017-04-29 VITALS — BP 157/81 | HR 74 | Resp 16 | Ht 73.0 in | Wt 242.0 lb

## 2017-04-29 DIAGNOSIS — Z8679 Personal history of other diseases of the circulatory system: Secondary | ICD-10-CM | POA: Diagnosis not present

## 2017-04-29 DIAGNOSIS — Z951 Presence of aortocoronary bypass graft: Secondary | ICD-10-CM

## 2017-04-29 DIAGNOSIS — I712 Thoracic aortic aneurysm, without rupture, unspecified: Secondary | ICD-10-CM

## 2017-04-29 DIAGNOSIS — Z9889 Other specified postprocedural states: Secondary | ICD-10-CM

## 2017-04-29 DIAGNOSIS — I77810 Thoracic aortic ectasia: Secondary | ICD-10-CM | POA: Diagnosis not present

## 2017-04-29 MED ORDER — IOPAMIDOL (ISOVUE-370) INJECTION 76%
75.0000 mL | Freq: Once | INTRAVENOUS | Status: AC | PRN
  Administered 2017-04-29: 75 mL via INTRAVENOUS

## 2017-04-29 NOTE — Progress Notes (Signed)
[address]       [place] [address]             [phone]     CARDIOTHORACIC SURGERY OFFICE NOTE  Referring Provider is Jacolyn Reedy, MD PCP is [name], MD   HPI:  Patient is an 84 year old male with history of coronary artery disease, hypertension, atrial fibrillation, hyperlipidemia, and type 2 diabetes mellitus who returns to the office today for follow-up and surveillance of mild aneurysmal enlargement of the ascending thoracic aorta.  He underwent coronary artery bypass grafting 3 and Maze procedure on 03/06/2012 for severe three-vessel coronary artery disease and recurrent paroxysmal atrial fibrillation.  He was noted to have mild aneurysmal enlargement of the ascending thoracic aorta at that time. He has been followed regularly ever since and was most recently seen in our office on 04/30/2016 at which time maximum transverse diameter of the ascending aorta remains stable at 4.3 cm.  He returns to the office today for routine follow-up and surveillance.  He reports that he has done well over the past year.  He is looking forward to a cruise that he plans to go on soon in the [place].  He does report a gradual decline in his exercise tolerance.  He states that he gets fatigued and short of breath a bit more easily than he did in the past.  However, he is still able to get around quite well and overall his energy level and exercise tolerance seems remarkably good for a gentleman his age.  He reports an occasional episode of chest discomfort for which he will take nitroglycerin.  This is been very infrequent, numbering at most 2 or 3 times over the past year.  The remainder of his review of systems is unremarkable.   Current Outpatient Medications  Medication Sig Dispense Refill  . allopurinol (ZYLOPRIM) 300 MG tablet Take 150 mg by mouth Daily.    . cholecalciferol (VITAMIN D) 1000 UNITS tablet Take 1,000 Units by mouth daily.    .  DiphenhydrAMINE HCl (BENADRYL ALLERGY PO) Take 25 mg by mouth as needed.     Marland Kitchen EPINEPHrine (EPIPEN 2-PAK) 0.3 mg/0.3 mL IJ SOAJ injection Use as directed for severe allergic reaction 1 Device 0  . fish oil-omega-3 fatty acids 1000 MG capsule Take 1 g by mouth daily.     . furosemide (LASIX) 40 MG tablet Take 20 mg by mouth daily.    Marland Kitchen galantamine (RAZADYNE ER) 8 MG 24 hr capsule Take 8 mg by mouth Daily.    . HONEY BEE VENOM IJ Inject as directed.    Marland Kitchen NITROSTAT 0.4 MG SL tablet Place 1 tablet under the tongue every 5 (five) minutes x 3 doses as needed for chest pain.     . pantoprazole (PROTONIX) 40 MG tablet Take 40 mg by mouth every other day.   4  . psyllium (METAMUCIL) 58.6 % powder Take 1 packet by mouth daily.    . rivaroxaban (XARELTO) 15 MG TABS tablet Take 1 tablet (15 mg total) by mouth daily with supper. (Patient taking differently: Take 20 mg by mouth daily with supper. ) 30 tablet 1  . rosuvastatin (CRESTOR) 20 MG tablet Take 10 mg by mouth daily.    Marland Kitchen telmisartan (MICARDIS) 40 MG tablet 1/2 tab by mouth once daily    . triamcinolone cream (KENALOG) 0.1 % Apply 1 application topically as needed (dry skin irritation).     Marland Kitchen  vitamin B-12 (CYANOCOBALAMIN) 1000 MCG tablet Take 1,000 mcg by mouth daily.     Marland Kitchen ZETIA 10 MG tablet Take 10 mg by mouth Daily.     No current facility-administered medications for this visit.       Physical Exam:   BP (!) 157/81 (BP Location: Right Arm, Patient Position: Sitting, Cuff Size: Large)   Pulse 74   Resp 16   Ht 6\' 1"  (1.854 m)   Wt 242 lb (109.8 kg)   SpO2 98% Comment: ON RA  BMI 31.93 kg/m   General:  Well-appearing  Chest:   Clear to auscultation  CV:   Regular rate and rhythm with soft systolic murmur  Incisions:  n/a  Abdomen:  Soft nontender  Extremities:  Warm and well-perfused  Diagnostic Tests:  CT ANGIOGRAPHY CHEST WITH CONTRAST  TECHNIQUE: Multidetector CT imaging of the chest was performed using the standard  protocol during bolus administration of intravenous contrast. Multiplanar CT image reconstructions and MIPs were obtained to evaluate the vascular anatomy.  CONTRAST:  49mL ISOVUE-370 IOPAMIDOL (ISOVUE-370) INJECTION 76%  COMPARISON:  04/30/2016 CT.  FINDINGS: Cardiovascular: Stable ascending thoracic aortic aneurysm measuring up to 4.3 cm. Atherosclerotic changes thoracic aorta with irregular plaque/small ulceration thoracic-abdominal aortic junction similar to prior exam. Atherosclerotic changes great vessel origin most notable involving the left subclavian artery. Atherosclerotic changes with narrowing superior mesenteric artery and right renal artery. Superior aspect of abdominal aortic aneurysm incompletely assessed on the present exam.  No obvious pulmonary embolus.  Post CABG. Prominent coronary artery calcification. Calcification aortic valve. Heart size top-normal.  Mediastinum/Nodes: Top-normal size subcarinal lymph nodes unchanged. No hilar adenopathy. No esophageal abnormality noted.  Lungs/Pleura: Scarring lung apices stable. Minimal scarring throughout remainder of lungs without significant change. No worrisome mass. Trachea and mainstem bronchi are patent.  Upper Abdomen: Upper pole right renal cyst unchanged. Scattered diverticula throughout the transverse colon.  Musculoskeletal: Postsurgical changes sternum. No acute osseous abnormality.  Review of the MIP images confirms the above findings.  IMPRESSION: Stable ascending thoracic aortic aneurysm measuring up to 4.3 cm. Recommend annual imaging followup by CTA or MRA. This recommendation follows 2010 ACCF/AHA/AATS/ACR/ASA/SCA/SCAI/SIR/STS/SVM Guidelines for the Diagnosis and Management of Patients with Thoracic Aortic Disease. Circulation. 2010; 121: e266-e369  Atherosclerotic changes thoracic aorta with irregular plaque/small ulceration thoracic-abdominal aortic junction similar to prior  exam.  Atherosclerotic changes origin of great vessels most notable involving the left subclavian artery.  Atherosclerotic changes with narrowing superior mesenteric artery and right renal artery.  Superior aspect of abdominal aortic aneurysm incompletely assessed on the present exam.  Post CABG.  Prominent coronary artery calcification.  Aortic Atherosclerosis (ICD10-I70.0).  Creatinine was obtained on site at [facility] at [address]  Results: Creatinine 1.4 mg/dL (GFR 45).   Electronically Signed   By: Genia Del M.D.   On: 04/29/2017 16:34   Impression:  I have personally reviewed the patient's follow-up CT angiogram performed earlier today which reveals stable mild fusiform dilatation of the ascending thoracic aorta.  In fact, the maximum transverse diameter of the ascending aorta has not changed at all since it was first imaged in 2014.   Plan:  We have not recommended any change the patient's current medications.  I have encouraged the patient to discuss his slowly declining exercise tolerance and symptoms of exertional shortness of breath with Dr. Wynonia Lawman at his next office visit.  With regard to his ascending thoracic aorta, we discussed whether or not to continue regular surveillance imaging.  We discussed risks associated with IV contrast.  At this point the patient desires to continue to follow up regularly, although we will plan to decrease the frequency of surveillance to every other year.  All of his questions have been addressed.  I spent in excess of 15 minutes during the conduct of this office consultation and >50% of this time involved direct face-to-face encounter with the patient for counseling and/or coordination of their care.    Valentina Gu. Roxy Manns, MD 04/29/2017 4:18 PM

## 2017-04-29 NOTE — Patient Instructions (Signed)
Continue all previous medications without any changes at this time  

## 2017-05-08 DIAGNOSIS — M67449 Ganglion, unspecified hand: Secondary | ICD-10-CM | POA: Insufficient documentation

## 2017-05-08 DIAGNOSIS — M67441 Ganglion, right hand: Secondary | ICD-10-CM | POA: Diagnosis not present

## 2017-06-10 DIAGNOSIS — L814 Other melanin hyperpigmentation: Secondary | ICD-10-CM | POA: Diagnosis not present

## 2017-06-10 DIAGNOSIS — L821 Other seborrheic keratosis: Secondary | ICD-10-CM | POA: Diagnosis not present

## 2017-06-10 DIAGNOSIS — D1801 Hemangioma of skin and subcutaneous tissue: Secondary | ICD-10-CM | POA: Diagnosis not present

## 2017-06-10 DIAGNOSIS — D485 Neoplasm of uncertain behavior of skin: Secondary | ICD-10-CM | POA: Diagnosis not present

## 2017-06-17 ENCOUNTER — Ambulatory Visit (INDEPENDENT_AMBULATORY_CARE_PROVIDER_SITE_OTHER): Payer: Medicare Other

## 2017-06-17 DIAGNOSIS — T63441D Toxic effect of venom of bees, accidental (unintentional), subsequent encounter: Secondary | ICD-10-CM

## 2017-06-19 DIAGNOSIS — H539 Unspecified visual disturbance: Secondary | ICD-10-CM | POA: Insufficient documentation

## 2017-06-19 DIAGNOSIS — S4990XA Unspecified injury of shoulder and upper arm, unspecified arm, initial encounter: Secondary | ICD-10-CM | POA: Insufficient documentation

## 2017-06-19 DIAGNOSIS — M542 Cervicalgia: Secondary | ICD-10-CM | POA: Diagnosis not present

## 2017-06-19 DIAGNOSIS — R519 Headache, unspecified: Secondary | ICD-10-CM | POA: Insufficient documentation

## 2017-06-19 DIAGNOSIS — W19XXXA Unspecified fall, initial encounter: Secondary | ICD-10-CM | POA: Insufficient documentation

## 2017-06-19 DIAGNOSIS — R51 Headache: Secondary | ICD-10-CM | POA: Diagnosis not present

## 2017-06-19 DIAGNOSIS — W19XXXS Unspecified fall, sequela: Secondary | ICD-10-CM | POA: Diagnosis not present

## 2017-06-19 DIAGNOSIS — S6991XA Unspecified injury of right wrist, hand and finger(s), initial encounter: Secondary | ICD-10-CM | POA: Diagnosis not present

## 2017-06-21 ENCOUNTER — Other Ambulatory Visit: Payer: Self-pay | Admitting: Family Medicine

## 2017-06-21 DIAGNOSIS — W19XXXA Unspecified fall, initial encounter: Secondary | ICD-10-CM

## 2017-06-21 DIAGNOSIS — H539 Unspecified visual disturbance: Secondary | ICD-10-CM

## 2017-06-21 DIAGNOSIS — G4452 New daily persistent headache (NDPH): Secondary | ICD-10-CM

## 2017-06-21 DIAGNOSIS — M542 Cervicalgia: Secondary | ICD-10-CM

## 2017-06-24 ENCOUNTER — Other Ambulatory Visit: Payer: Medicare Other

## 2017-06-24 DIAGNOSIS — L570 Actinic keratosis: Secondary | ICD-10-CM | POA: Diagnosis not present

## 2017-06-28 ENCOUNTER — Ambulatory Visit
Admission: RE | Admit: 2017-06-28 | Discharge: 2017-06-28 | Disposition: A | Discharge status: Home / Self Care | Payer: Medicare Other | Source: Ambulatory Visit | Attending: Family Medicine | Admitting: Family Medicine

## 2017-06-28 DIAGNOSIS — R51 Headache: Secondary | ICD-10-CM | POA: Diagnosis not present

## 2017-06-28 DIAGNOSIS — G4452 New daily persistent headache (NDPH): Secondary | ICD-10-CM

## 2017-06-28 DIAGNOSIS — W19XXXA Unspecified fall, initial encounter: Secondary | ICD-10-CM

## 2017-06-28 DIAGNOSIS — S199XXA Unspecified injury of neck, initial encounter: Secondary | ICD-10-CM | POA: Diagnosis not present

## 2017-06-28 DIAGNOSIS — H539 Unspecified visual disturbance: Secondary | ICD-10-CM

## 2017-06-28 DIAGNOSIS — S0990XA Unspecified injury of head, initial encounter: Secondary | ICD-10-CM | POA: Diagnosis not present

## 2017-06-28 DIAGNOSIS — M542 Cervicalgia: Secondary | ICD-10-CM

## 2017-07-03 DIAGNOSIS — I6529 Occlusion and stenosis of unspecified carotid artery: Secondary | ICD-10-CM | POA: Insufficient documentation

## 2017-07-03 DIAGNOSIS — I10 Essential (primary) hypertension: Secondary | ICD-10-CM | POA: Diagnosis not present

## 2017-07-03 DIAGNOSIS — F0390 Unspecified dementia without behavioral disturbance: Secondary | ICD-10-CM | POA: Diagnosis not present

## 2017-07-03 DIAGNOSIS — H9193 Unspecified hearing loss, bilateral: Secondary | ICD-10-CM | POA: Diagnosis not present

## 2017-07-03 DIAGNOSIS — H9190 Unspecified hearing loss, unspecified ear: Secondary | ICD-10-CM | POA: Insufficient documentation

## 2017-07-03 DIAGNOSIS — M542 Cervicalgia: Secondary | ICD-10-CM | POA: Diagnosis not present

## 2017-07-10 DIAGNOSIS — M5412 Radiculopathy, cervical region: Secondary | ICD-10-CM | POA: Diagnosis not present

## 2017-07-10 DIAGNOSIS — R293 Abnormal posture: Secondary | ICD-10-CM | POA: Diagnosis not present

## 2017-07-10 DIAGNOSIS — M542 Cervicalgia: Secondary | ICD-10-CM | POA: Diagnosis not present

## 2017-07-15 DIAGNOSIS — R293 Abnormal posture: Secondary | ICD-10-CM | POA: Diagnosis not present

## 2017-07-15 DIAGNOSIS — M5412 Radiculopathy, cervical region: Secondary | ICD-10-CM | POA: Diagnosis not present

## 2017-07-15 DIAGNOSIS — M542 Cervicalgia: Secondary | ICD-10-CM | POA: Diagnosis not present

## 2017-07-17 DIAGNOSIS — R293 Abnormal posture: Secondary | ICD-10-CM | POA: Diagnosis not present

## 2017-07-17 DIAGNOSIS — M542 Cervicalgia: Secondary | ICD-10-CM | POA: Diagnosis not present

## 2017-07-17 DIAGNOSIS — M5412 Radiculopathy, cervical region: Secondary | ICD-10-CM | POA: Diagnosis not present

## 2017-07-22 DIAGNOSIS — M5412 Radiculopathy, cervical region: Secondary | ICD-10-CM | POA: Diagnosis not present

## 2017-07-22 DIAGNOSIS — M542 Cervicalgia: Secondary | ICD-10-CM | POA: Diagnosis not present

## 2017-07-22 DIAGNOSIS — R293 Abnormal posture: Secondary | ICD-10-CM | POA: Diagnosis not present

## 2017-07-25 DIAGNOSIS — M5412 Radiculopathy, cervical region: Secondary | ICD-10-CM | POA: Diagnosis not present

## 2017-07-25 DIAGNOSIS — R293 Abnormal posture: Secondary | ICD-10-CM | POA: Diagnosis not present

## 2017-07-25 DIAGNOSIS — M542 Cervicalgia: Secondary | ICD-10-CM | POA: Diagnosis not present

## 2017-07-30 DIAGNOSIS — I4510 Unspecified right bundle-branch block: Secondary | ICD-10-CM | POA: Diagnosis not present

## 2017-07-30 DIAGNOSIS — I872 Venous insufficiency (chronic) (peripheral): Secondary | ICD-10-CM | POA: Diagnosis not present

## 2017-07-30 DIAGNOSIS — I712 Thoracic aortic aneurysm, without rupture: Secondary | ICD-10-CM | POA: Diagnosis not present

## 2017-07-30 DIAGNOSIS — I2510 Atherosclerotic heart disease of native coronary artery without angina pectoris: Secondary | ICD-10-CM | POA: Diagnosis not present

## 2017-07-31 DIAGNOSIS — M5412 Radiculopathy, cervical region: Secondary | ICD-10-CM | POA: Diagnosis not present

## 2017-07-31 DIAGNOSIS — M542 Cervicalgia: Secondary | ICD-10-CM | POA: Diagnosis not present

## 2017-07-31 DIAGNOSIS — R293 Abnormal posture: Secondary | ICD-10-CM | POA: Diagnosis not present

## 2017-08-06 DIAGNOSIS — M542 Cervicalgia: Secondary | ICD-10-CM | POA: Diagnosis not present

## 2017-08-06 DIAGNOSIS — R293 Abnormal posture: Secondary | ICD-10-CM | POA: Diagnosis not present

## 2017-08-06 DIAGNOSIS — M5412 Radiculopathy, cervical region: Secondary | ICD-10-CM | POA: Diagnosis not present

## 2017-08-12 ENCOUNTER — Ambulatory Visit (INDEPENDENT_AMBULATORY_CARE_PROVIDER_SITE_OTHER): Payer: Medicare Other

## 2017-08-12 DIAGNOSIS — T63441D Toxic effect of venom of bees, accidental (unintentional), subsequent encounter: Secondary | ICD-10-CM

## 2017-08-15 DIAGNOSIS — M542 Cervicalgia: Secondary | ICD-10-CM | POA: Diagnosis not present

## 2017-08-15 DIAGNOSIS — M5412 Radiculopathy, cervical region: Secondary | ICD-10-CM | POA: Diagnosis not present

## 2017-08-15 DIAGNOSIS — R293 Abnormal posture: Secondary | ICD-10-CM | POA: Diagnosis not present

## 2017-08-22 DIAGNOSIS — M5412 Radiculopathy, cervical region: Secondary | ICD-10-CM | POA: Diagnosis not present

## 2017-08-22 DIAGNOSIS — R293 Abnormal posture: Secondary | ICD-10-CM | POA: Diagnosis not present

## 2017-08-22 DIAGNOSIS — M542 Cervicalgia: Secondary | ICD-10-CM | POA: Diagnosis not present

## 2017-09-03 DIAGNOSIS — H04123 Dry eye syndrome of bilateral lacrimal glands: Secondary | ICD-10-CM | POA: Diagnosis not present

## 2017-09-03 DIAGNOSIS — H40013 Open angle with borderline findings, low risk, bilateral: Secondary | ICD-10-CM | POA: Diagnosis not present

## 2017-09-03 DIAGNOSIS — Z961 Presence of intraocular lens: Secondary | ICD-10-CM | POA: Diagnosis not present

## 2017-09-05 DIAGNOSIS — R293 Abnormal posture: Secondary | ICD-10-CM | POA: Diagnosis not present

## 2017-09-05 DIAGNOSIS — M5412 Radiculopathy, cervical region: Secondary | ICD-10-CM | POA: Diagnosis not present

## 2017-09-05 DIAGNOSIS — M542 Cervicalgia: Secondary | ICD-10-CM | POA: Diagnosis not present

## 2017-10-07 ENCOUNTER — Ambulatory Visit (INDEPENDENT_AMBULATORY_CARE_PROVIDER_SITE_OTHER): Payer: Medicare Other

## 2017-10-07 DIAGNOSIS — T63441D Toxic effect of venom of bees, accidental (unintentional), subsequent encounter: Secondary | ICD-10-CM

## 2017-10-14 DIAGNOSIS — G4733 Obstructive sleep apnea (adult) (pediatric): Secondary | ICD-10-CM | POA: Diagnosis not present

## 2017-10-14 DIAGNOSIS — I5021 Acute systolic (congestive) heart failure: Secondary | ICD-10-CM | POA: Diagnosis not present

## 2017-10-14 DIAGNOSIS — I259 Chronic ischemic heart disease, unspecified: Secondary | ICD-10-CM | POA: Diagnosis not present

## 2017-10-14 DIAGNOSIS — I4891 Unspecified atrial fibrillation: Secondary | ICD-10-CM | POA: Diagnosis not present

## 2017-10-24 DIAGNOSIS — L819 Disorder of pigmentation, unspecified: Secondary | ICD-10-CM | POA: Diagnosis not present

## 2017-10-24 DIAGNOSIS — L570 Actinic keratosis: Secondary | ICD-10-CM | POA: Diagnosis not present

## 2017-10-25 DIAGNOSIS — Z23 Encounter for immunization: Secondary | ICD-10-CM | POA: Diagnosis not present

## 2017-10-30 DIAGNOSIS — I350 Nonrheumatic aortic (valve) stenosis: Secondary | ICD-10-CM | POA: Diagnosis not present

## 2017-10-30 DIAGNOSIS — Z951 Presence of aortocoronary bypass graft: Secondary | ICD-10-CM | POA: Diagnosis not present

## 2017-10-30 DIAGNOSIS — I25119 Atherosclerotic heart disease of native coronary artery with unspecified angina pectoris: Secondary | ICD-10-CM | POA: Diagnosis not present

## 2017-10-30 DIAGNOSIS — R0609 Other forms of dyspnea: Secondary | ICD-10-CM | POA: Diagnosis not present

## 2017-11-19 DIAGNOSIS — I10 Essential (primary) hypertension: Secondary | ICD-10-CM | POA: Diagnosis not present

## 2017-11-19 DIAGNOSIS — R82998 Other abnormal findings in urine: Secondary | ICD-10-CM | POA: Diagnosis not present

## 2017-11-19 DIAGNOSIS — E538 Deficiency of other specified B group vitamins: Secondary | ICD-10-CM | POA: Diagnosis not present

## 2017-11-19 DIAGNOSIS — E7849 Other hyperlipidemia: Secondary | ICD-10-CM | POA: Diagnosis not present

## 2017-11-19 DIAGNOSIS — M109 Gout, unspecified: Secondary | ICD-10-CM | POA: Diagnosis not present

## 2017-11-19 DIAGNOSIS — Z0000 Encounter for general adult medical examination without abnormal findings: Secondary | ICD-10-CM | POA: Insufficient documentation

## 2017-11-20 DIAGNOSIS — Z1212 Encounter for screening for malignant neoplasm of rectum: Secondary | ICD-10-CM | POA: Diagnosis not present

## 2017-11-22 DIAGNOSIS — I25119 Atherosclerotic heart disease of native coronary artery with unspecified angina pectoris: Secondary | ICD-10-CM | POA: Diagnosis not present

## 2017-11-22 DIAGNOSIS — R0609 Other forms of dyspnea: Secondary | ICD-10-CM | POA: Diagnosis not present

## 2017-11-25 DIAGNOSIS — I350 Nonrheumatic aortic (valve) stenosis: Secondary | ICD-10-CM | POA: Diagnosis not present

## 2017-11-25 DIAGNOSIS — R0609 Other forms of dyspnea: Secondary | ICD-10-CM | POA: Diagnosis not present

## 2017-11-26 DIAGNOSIS — I10 Essential (primary) hypertension: Secondary | ICD-10-CM | POA: Diagnosis not present

## 2017-11-26 DIAGNOSIS — I350 Nonrheumatic aortic (valve) stenosis: Secondary | ICD-10-CM | POA: Diagnosis not present

## 2017-11-26 DIAGNOSIS — I712 Thoracic aortic aneurysm, without rupture: Secondary | ICD-10-CM | POA: Diagnosis not present

## 2017-11-26 DIAGNOSIS — Z0000 Encounter for general adult medical examination without abnormal findings: Secondary | ICD-10-CM | POA: Diagnosis not present

## 2017-12-02 ENCOUNTER — Ambulatory Visit (INDEPENDENT_AMBULATORY_CARE_PROVIDER_SITE_OTHER): Payer: Medicare Other | Admitting: *Deleted

## 2017-12-02 DIAGNOSIS — I25119 Atherosclerotic heart disease of native coronary artery with unspecified angina pectoris: Secondary | ICD-10-CM | POA: Diagnosis not present

## 2017-12-02 DIAGNOSIS — T63441D Toxic effect of venom of bees, accidental (unintentional), subsequent encounter: Secondary | ICD-10-CM | POA: Diagnosis not present

## 2017-12-02 DIAGNOSIS — I480 Paroxysmal atrial fibrillation: Secondary | ICD-10-CM | POA: Diagnosis not present

## 2018-01-27 ENCOUNTER — Ambulatory Visit (INDEPENDENT_AMBULATORY_CARE_PROVIDER_SITE_OTHER): Payer: Medicare Other

## 2018-01-27 DIAGNOSIS — T63441D Toxic effect of venom of bees, accidental (unintentional), subsequent encounter: Secondary | ICD-10-CM | POA: Diagnosis not present

## 2018-02-03 ENCOUNTER — Other Ambulatory Visit: Payer: Self-pay

## 2018-02-03 MED ORDER — POTASSIUM CHLORIDE ER 10 MEQ PO CPCR: 10.0000 meq | ORAL_CAPSULE | Freq: Every day | ORAL | 1 refills | Status: DC

## 2018-02-04 ENCOUNTER — Other Ambulatory Visit: Payer: Self-pay

## 2018-02-04 ENCOUNTER — Other Ambulatory Visit: Payer: Self-pay | Admitting: Cardiology

## 2018-02-05 ENCOUNTER — Other Ambulatory Visit: Payer: Self-pay

## 2018-02-05 MED ORDER — AMLODIPINE BESYLATE 5 MG PO TABS: 5.0000 mg | ORAL_TABLET | Freq: Every day | ORAL | 1 refills | Status: DC

## 2018-02-06 ENCOUNTER — Ambulatory Visit: Payer: Medicare Other | Admitting: Cardiology

## 2018-02-06 ENCOUNTER — Encounter: Payer: Self-pay | Admitting: Cardiology

## 2018-02-06 VITALS — BP 151/69 | HR 57 | Ht 73.0 in | Wt 250.3 lb

## 2018-02-06 DIAGNOSIS — I712 Thoracic aortic aneurysm, without rupture: Secondary | ICD-10-CM

## 2018-02-06 DIAGNOSIS — I10 Essential (primary) hypertension: Secondary | ICD-10-CM

## 2018-02-06 DIAGNOSIS — R0600 Dyspnea, unspecified: Secondary | ICD-10-CM

## 2018-02-06 DIAGNOSIS — I25118 Atherosclerotic heart disease of native coronary artery with other forms of angina pectoris: Secondary | ICD-10-CM

## 2018-02-06 DIAGNOSIS — G4733 Obstructive sleep apnea (adult) (pediatric): Secondary | ICD-10-CM

## 2018-02-06 DIAGNOSIS — I7121 Aneurysm of the ascending aorta, without rupture: Secondary | ICD-10-CM

## 2018-02-06 DIAGNOSIS — Z951 Presence of aortocoronary bypass graft: Secondary | ICD-10-CM | POA: Diagnosis not present

## 2018-02-06 DIAGNOSIS — Z8679 Personal history of other diseases of the circulatory system: Secondary | ICD-10-CM

## 2018-02-06 DIAGNOSIS — I872 Venous insufficiency (chronic) (peripheral): Secondary | ICD-10-CM

## 2018-02-06 DIAGNOSIS — R0609 Other forms of dyspnea: Secondary | ICD-10-CM

## 2018-02-06 DIAGNOSIS — Z9889 Other specified postprocedural states: Secondary | ICD-10-CM

## 2018-02-06 MED ORDER — ISOSORBIDE MONONITRATE ER 60 MG PO TB24: 60.0000 mg | ORAL_TABLET | Freq: Every day | ORAL | 2 refills | Status: DC

## 2018-02-06 NOTE — Progress Notes (Signed)
Subjective:   @Patient  ID: [name] [name], male    DOB: 1926/10/21, 85 y.o.   MRN: [id number]  No chief complaint on file.   HPI [name] is a 85 YO pleasant Caucasian male with vitamin B12 deficiency, mild dementia, hyperlipidemia, hypertension, CAD status post CABG x 3 in 2014, chronic stable angina,  paroxysmal atrial fibrillation (no documentation in Epic), obstructive sleep apnea not currently on CPAP, mild aortic stenosis by echo in 2018 with thoracic aortic aneurysm and dilation of ascending aorta and follows Dr. Lilly Cove (No correlation with TTE with regards to aneurysm), venous stasis edema. I had increased lasix and also advised support stocking and Metoprolol with improvement in edema and anginal symptoms. Established with me on 10/30/17, I last saw him on 12/02/2017.   Past Medical History:  Diagnosis Date  . AAA (abdominal aortic aneurysm) ([place])   . Acute coronary syndrome ([place]) 02/29/2012  . Acute systolic heart failure ([facility]) 03/03/2012  . Angina   . Ascending aorta dilatation ([facility]) 03/13/2012   Fusiform dilatation of the ascending thoracic aorta discovered during surgery, max diameter 4.2-4.3 cm  . Atrial fibrillation ([facility]) 02/29/2012   Recurrent paroxysmal, new-onset  . BPH (benign prostatic hypertrophy)   . CAD (coronary artery disease)    Cath April 2012 40% left main, 50% LAD, 40% OM, occluded RCA with L-R collaterals   . CHF (congestive heart failure) ([place])   . Chronic venous insufficiency   . Dyspnea   . Glaucoma   . Gout   . Hyperlipidemia   . Hypertension   . Kidney stones   . MI (myocardial infarction) ([facility])   . Obesity (BMI 30-39.9)   . Paroxysmal atrial fibrillation (H[facility]) 02/29/2012   Recurrent paroxysmal, new-onset   . S/P CABG x 3 03/06/2012   LIMA to LAD, SVG to OM, SVG to RCA, EVH via right thigh  . S/P Maze operation for atrial fibrillation 03/06/2012   Complete bilateral lesions set using bipolar radiofrequency and cryothermy ablation with  clipping of LA appendage    Past Surgical History:  Procedure Laterality Date  . CARDIAC CATHETERIZATION    . CATARACT EXTRACTION, BILATERAL    . CHOLECYSTECTOMY N/A 03/24/2012   Procedure: LAPAROSCOPIC CHOLECYSTECTOMY WITH INTRAOPERATIVE CHOLANGIOGRAM;  Surgeon: Zenovia Jarred, MD;  Location: [facility];  Service: General;  Laterality: N/A;  . CORONARY ARTERY BYPASS GRAFT N/A 03/06/2012   Procedure: CORONARY ARTERY BYPASS GRAFTING (CABG);  Surgeon: Rexene Alberts, MD;  Location: [facility];  Service: Open Heart Surgery;  Laterality: N/A;  . ENDOVEIN HARVEST OF GREATER SAPHENOUS VEIN Right 03/06/2012   Procedure: ENDOVEIN HARVEST OF GREATER SAPHENOUS VEIN;  Surgeon: Rexene Alberts, MD;  Location: [facility];  Service: Open Heart Surgery;  Laterality: Right;  . INTRAOPERATIVE TRANSESOPHAGEAL ECHOCARDIOGRAM N/A 03/06/2012   Procedure: INTRAOPERATIVE TRANSESOPHAGEAL ECHOCARDIOGRAM;  Surgeon: Rexene Alberts, MD;  Location: [facility];  Service: Open Heart Surgery;  Laterality: N/A;  . LEFT HEART CATH Right 03/02/2012   Procedure: LEFT HEART CATH;  Surgeon: Sherren Mocha, MD;  [facility];  Service: Cardiovascular;  Laterality: Right;  Marland Kitchen MAZE N/A 03/06/2012   Procedure: MAZE;  Surgeon: Rexene Alberts, MD;  Location: [name];  Service: Open Heart Surgery;  Laterality: N/A;  . PROSTATECTOMY      Social History   Socioeconomic History  . Marital status: Married    Spouse name: Not on file  . Number of children: 4  . Years of education: Not  on file  . Highest education level: Not on file  Occupational History  . 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 Financial resource strain: Not on file  . Food insecurity:    Worry: Not on file    Inability: Not on file  . Transportation needs:    Medical: Not on file    Non-medical: Not on file  Tobacco Use  . Smoking status: Former Smoker    Last attempt to quit: 11/18/1962    Years since quitting: 55.2  . Smokeless tobacco: Never  Used  Substance and Sexual Activity  . Alcohol use: Yes    Alcohol/week: 1.0 standard drinks    Types: 1 Shots of liquor per week    Comment: weekly  . Drug use: Not on file  . Sexual activity: Yes  Lifestyle  . Physical activity:    Days per week: Not on file    Minutes per session: Not on file  . Stress: Not on file  Relationships  . Social connections:    Talks on phone: Not on file    Gets together: Not on file    Attends religious service: Not on file    Active member of club or organization: Not on file    Attends meetings of clubs or organizations: Not on file    Relationship status: Not on file  . Intimate partner violence:    Fear of current or ex partner: Not on file    Emotionally abused: Not on file    Physically abused: Not on file    Forced sexual activity: Not on file  Other Topics Concern  . Not on file  Social History Narrative   Lives with wife in [place].     Very active physically without significant limitations.    Exercises regularly    Current Outpatient Medications on File Prior to Visit  Medication Sig Dispense Refill  . allopurinol (ZYLOPRIM) 300 MG tablet Take 150 mg by mouth Daily.    Marland Kitchen amLODipine (NORVASC) 5 MG tablet TAKE 1 TABLET BY MOUTH DAILY 30 tablet 1  . amLODipine (NORVASC) 5 MG tablet Take 1 tablet (5 mg total) by mouth daily. 90 tablet 1  . cholecalciferol (VITAMIN D) 1000 UNITS tablet Take 1,000 Units by mouth daily.    . DiphenhydrAMINE HCl (BENADRYL ALLERGY PO) Take 25 mg by mouth as needed.     Marland Kitchen EPINEPHrine (EPIPEN 2-PAK) 0.3 mg/0.3 mL IJ SOAJ injection Use as directed for severe allergic reaction 1 Device 0  . fish oil-omega-3 fatty acids 1000 MG capsule Take 1 g by mouth daily.     . furosemide (LASIX) 40 MG tablet Take 20 mg by mouth daily.    Marland Kitchen galantamine (RAZADYNE ER) 8 MG 24 hr capsule Take 8 mg by mouth Daily.    . HONEY BEE VENOM IJ Inject as directed.    Marland Kitchen NITROSTAT 0.4 MG SL tablet Place 1 tablet under the tongue  every 5 (five) minutes x 3 doses as needed for chest pain.     . pantoprazole (PROTONIX) 40 MG tablet Take 40 mg by mouth every other day.   4  . potassium chloride (MICRO-K) 10 MEQ CR capsule Take 1 capsule (10 mEq total) by mouth daily. With Lasix 90 capsule 1  . psyllium (METAMUCIL) 58.6 % powder Take 1 packet by mouth daily.    . rivaroxaban (XARELTO) 15 MG TABS tablet Take 1 tablet (15 mg total) by mouth daily  with supper. (Patient taking differently: Take 20 mg by mouth daily with supper. ) 30 tablet 1  . rosuvastatin (CRESTOR) 20 MG tablet Take 10 mg by mouth daily.    Marland Kitchen telmisartan (MICARDIS) 40 MG tablet 1/2 tab by mouth once daily    . triamcinolone cream (KENALOG) 0.1 % Apply 1 application topically as needed (dry skin irritation).     . vitamin B-12 (CYANOCOBALAMIN) 1000 MCG tablet Take 1,000 mcg by mouth daily.     Marland Kitchen ZETIA 10 MG tablet Take 10 mg by mouth Daily.     No current facility-administered medications on file prior to visit.    Review of Systems  Constitutional: Positive for malaise/fatigue (chronic and stable also uses CPAP). Negative for weight loss.  Respiratory: Positive for shortness of breath. Negative for cough and hemoptysis.   Cardiovascular: Positive for chest pain and leg swelling. Negative for palpitations and claudication.  Gastrointestinal: Negative for abdominal pain, blood in stool, constipation, heartburn and vomiting.  Genitourinary: Negative for dysuria.  Musculoskeletal: Positive for neck pain (chronic). Negative for joint pain and myalgias.  Neurological: Negative for dizziness, focal weakness and headaches.  Endo/Heme/Allergies: Does not bruise/bleed easily.  Psychiatric/Behavioral: Negative for depression. The patient is not nervous/anxious.   All other systems reviewed and are negative.     Objective:  Blood pressure (!) 151/69, pulse (!) 57, height 6\' 1"  (1.854 m), weight 250 lb 4.8 oz (113.5 kg), SpO2 98 %.  Physical Exam  Constitutional:  He appears well-developed and well-nourished. No distress.  HENT:  Head: Atraumatic.  Eyes: Conjunctivae are normal.  Neck: Neck supple. No JVD present. No thyromegaly present.  Cardiovascular: Normal rate and regular rhythm. Exam reveals no gallop.  Murmur heard.  Scratchy midsystolic murmur is present with a grade of 3/6 at the upper right sternal border. Pulses:      Carotid pulses are 2+ on the right side and 2+ on the left side.      Radial pulses are 2+ on the right side and 2+ on the left side.       Femoral pulses are 2+ on the right side and 2+ on the left side.      Popliteal pulses are 1+ on the right side and 1+ on the left side.       Dorsalis pedis pulses are 1+ on the right side and 1+ on the left side.  Vascular examination reveals bilateral pigmented lower extremities with Perclose veins.  2+ pitting edema.  Pulmonary/Chest: Effort normal and breath sounds normal.  Abdominal: Soft. Bowel sounds are normal.  Musculoskeletal: Normal range of motion.        General: No edema.  Neurological: He is alert.  Skin: Skin is warm and dry.  Psychiatric: He has a normal mood and affect.    Radiology: @[id number] @  Nuclear stress test   11/22/2017: 1. Lexiscan stress test with low level exercise was performed. Exercise capacity was not assessed. Stress symptoms included dyspnea. Peak effect blood pressure was 114/66 mmHg. The resting and stress electrocardiogram demonstrated normal sinus rhythm, RBBB, no resting arrhythmias and normal rest repolarization. Stress EKG is non diagnostic for ischemia as it is a pharmacologic stress. 2. The overall quality of the study is good. Left ventricular cavity is noted to be mildly enlarged on the rest and stress studies. Gated SPECT imaging demonstrated mild hypokinesis of the basal inferolateral and mid inferolateral myocardial wall(s). The left ventricular ejection fraction was calculated or visually estimated to be 69%. Medium  sized, medium  intensity, fixed perfusion defect in mid to basal inferolateral myocardium suggests old LCx territory infarct with no periinfarct ischemia. 3. Low risk study.  Echocardiogram   11/25/2017: Left ventricle cavity is normal in size. Moderate concentric hypertrophy of the left ventricle. Normal global wall motion. Doppler evidence of grade III (restrictive) diastolic dysfunction, elevated LAP. Due to MV apparatus calcification, diastolic function calculation may be inaccurate. Calculated EF 56%. Severe calcification of the aortic valve annulus. Trileaflet aortic valve with mild (Grade I) regurgitation. Severe aortic valve leaflet calcification. Moderately restricted aortic valve leaflets. Mild aortic valve stenosis. Aortic valve peak pressure gradient of 29 and mean gradient of 18 mmHg, calculated aortic valve area 1.26 cm. Mild (Grade I) mitral regurgitation. Moderate calcification of the mitral valve annulus. Moderate mitral valve leaflet calcification. Normal mitral valve leaflet mobility. Mild tricuspid regurgitation. Mild to moderate pulmonary hypertension. Estimated pulmonary artery systolic pressure 42 mm Hg The aortic root is normal in size. Moderate atherosclerotic changes in the aorta. IVC is dilated with poor inspiration collapse consistent with elevated right atrial pressure. Compared to 03/05/2016, no significant change.  Coronary angiogram 03/02/2012: 50-60% left main, 99% LAD, 40% OM, occluded RCA with L-R collaterals  Assessment & Recommendations:   1. Coronary artery disease of native artery of native heart with stable angina pectoris ([place]) CABG with LIMA to LAD, SVG to OM, SVG to RCA, with Maze and amputation of left atrial appendage in March 2014 with Dr. Roxy Manns   2. Chronic venous insufficiency Improved on Lasix and support stockings  3. Essential hypertension BP is elevated. Added Imdur, has stage 3 CKD BMP Latest Ref Rng & Units 04/22/2015 03/08/2015 03/04/2015  Glucose 65 - 99  mg/dL - 113(H) 171(H)  BUN 6 - 20 mg/dL - 13 16  Creatinine 0.6 - 1.3 mg/dL 1.1 1.44(H) 1.25(H)  Sodium 135 - 145 mmol/L - 135 137  Potassium 3.5 - 5.1 mmol/L - 4.2 4.1  Chloride 101 - 111 mmol/L - 97(L) 101  CO2 22 - 32 mmol/L - 26 24  Calcium 8.9 - 10.3 mg/dL - 9.5 9.5     4. S/P CABG x 3 CABG with LIMA to LAD, SVG to OM, SVG to RCA, with Maze and amputation of left atrial appendage in March 2014 with Dr. Roxy Manns  5. Paroxysmal atrial fibrillation S/P Maze operation for atrial fibrillation CHA2DS2-VASc Score is .4  -(CHF; HTN; vasc disease DM,  Male = 1; Age <65 =0; 65-74 = 1,  >75 =2; stroke = 2).  -(Yearly risk of stroke: Score of 1=1.3; 2=2.2; 3=3.2; 4=4; 5=6.7; 6=9.8; 7=>9.8)  EKG 02/06/2018: Probably normal sinus rhythm at the rate of 61 bpm, normal axis, right bundle branch block.  Normal QT interval.  No evidence of ischemia.  Presently on long term anticoagulation with Xarelto and was previously patient of Wynonia Lawman. EKG 12/02/2017: Marked sinus bradycardia at rate of 45 bpm, normal axis, right bundle branch block.  No evidence of ischemia. Compared to EKG 10/30/2017: Normal sinus rhythm at the rate of 67 bpm.  Dyspnea on exertion Contributing factors include recent nuclear stress test in November 2019revealing mild inferior ischemia, normal EF, recommended continued medical therapy of advanced age and mild symptoms.  6. Thoracic ascending aortic aneurysm (HCC) CTA of chest 04/29/2017: Stable ascending thoracic aortic aneurysm measuring up to 4.3 cm. Atherosclerotic changes thoracic aorta with irregular plaque/small ulceration thoracic-abdominal aortic junction similar to prior exam.  Atherosclerotic changes origin of great vessels most notable involving the left subclavian artery.  Atherosclerotic changes with narrowing superior mesenteric artery and right renal artery. Superior aspect of abdominal aortic aneurysm incompletely assessed   Recommendation: His symptoms of angina  are occurring mostly at night, I suspect it could be related to issues with sleep apnea.  Advised him to follow-up with pulmonary medicine to reevaluate sleep apnea as he is not having anginal symptoms with exertion activity during the daytime.  I have added isosorbide mononitrate 60 mg in the evening both for blood pressure issues and for angina pectoris. He does have bilateral carotid artery bruit, Carotid duplex and 20/8/19 with mild plaque and less than 40% stenosis.  Adrian Prows, MD, [facility] 02/06/2018, [facility]. [place] Pager: [phone] Office: [phone] If no answer Cell [phone]

## 2018-02-12 ENCOUNTER — Encounter: Payer: Self-pay | Admitting: Pulmonary Disease

## 2018-02-12 ENCOUNTER — Ambulatory Visit: Payer: Medicare Other | Admitting: Pulmonary Disease

## 2018-02-12 VITALS — BP 128/72 | HR 60 | Ht 72.0 in | Wt 250.6 lb

## 2018-02-12 DIAGNOSIS — G4733 Obstructive sleep apnea (adult) (pediatric): Secondary | ICD-10-CM

## 2018-02-12 DIAGNOSIS — Z9989 Dependence on other enabling machines and devices: Secondary | ICD-10-CM | POA: Diagnosis not present

## 2018-02-12 DIAGNOSIS — R0789 Other chest pain: Secondary | ICD-10-CM

## 2018-02-12 NOTE — Patient Instructions (Signed)
Will get your CPAP mask refitted  Will arrange for overnight oxygen test with CPAP  Follow up in 3 months

## 2018-02-12 NOTE — Progress Notes (Signed)
Pulmonary, Critical Care, and Sleep Medicine  Chief Complaint  Patient presents with  . Follow-up    Pt is having hard time with cpap pressure settings. Pt has several leaks each night, and cannot get much rest lately with the settings.    Constitutional:  BP 128/72 (BP Location: Left Arm, Cuff Size: Normal)   Pulse 60   Ht 6' (1.829 m)   Wt 250 lb 9.6 oz (113.7 kg)   SpO2 98%   BMI 33.99 kg/m   Past Medical History:  HTN, CAD s/p CABG 2014, A fib s/p MAZE 1610, systolic CHF, HLD, BPH, Gout, Glaucoma, Nephrolithiasis  Brief Summary:  [name] [name] is a 85 y.o. male with obstructive sleep apnea.  He was getting trouble with chest pain when waking up in the morning.  He was seen by Dr. Einar Gip with cardiology, and cardiac assessment was unrevealing.  There was concern he could have trouble with his CPAP set up or low oxygen at night.  He has full face mask.  Can fall asleep with CPAP.  Pressure starts to build up during the night and then mask leaks.  He has to wake up, turn off machine and restart it, and then go back to sleep.  He is not having sinus congestion, sore throat, dry mouth, or aerophagia.  He denies cough, wheeze, sputum, or chest congestion.  Wears compression hose for ankle swelling.  Doesn't feel like his breathing limits his activities.   Physical Exam:   Appearance - well kempt   ENMT - clear nasal mucosa, midline nasal  septum, no oral exudates, no LAN, trachea midline  Respiratory - normal chest wall, normal respiratory effort, no accessory muscle use, no wheeze/rales  CV - s1s2 regular rate and rhythm, no murmurs, no peripheral edema, radial pulses symmetric  GI - soft, non tender, no masses  Lymph - no adenopathy noted in neck and axillary areas  MSK - normal gait  Ext - no cyanosis, clubbing, or joint inflammation noted  Skin - no rashes, lesions, or ulcers  Neuro - normal strength, oriented x 3  Psych - normal mood and  affect  Discussion:  He has trouble with mask leak and chest discomfort when waking up.  His AHI is slightly elevated on download today, and he has mask leak.  Assessment/Plan:   Obstructive sleep apnea. - he has been compliant with CPAP - will get his mask refit - will arrange for ONO with CPAP - continue auto CPAP range 8 to 15 cm H2O for now - might need in lab titration study   Patient Instructions  Will get your CPAP mask refitted  Will arrange for overnight oxygen test with CPAP  Follow up in 3 months  A total of  27 minutes were spent face to face with the patient and more than half of that time involved counseling or coordination of care.   Chesley Mires, MD  Pulmonary/Critical Care Pager: [phone] 02/12/2018, 11:54 AM  Flow Sheet    Sleep tests:  PSG 07/29/14 >> AHI 29.5, SaO2 low 88%.  CPAP 12 >> AHI 0 Auto CPAP 01/12/18 to 02/10/18 >> used on 29 of 30 nights with average 8 hrs 48 min.  Average AHI 10 with median CPAP 9 and 95 th percentile CPAP 11 cm H2O  Cardiac tests:  Echo 10/25/14 >> mod LVH, EF 60 to 65%, mild/mod AR, mod LA dilation, PAS 47 mmHg  Medications:   Allergies as of 02/12/2018  Reactions   Amoxicillin Other (See Comments)   Headache, debilitated   Bee Venom Anaphylaxis   Morphine And Related Other (See Comments)   hypotension      Medication List       Accurate as of February 12, 2018 11:54 AM. Always use your most recent med list.        allopurinol 300 MG tablet Commonly known as:  ZYLOPRIM Take 150 mg by mouth Daily.   amLODipine 5 MG tablet Commonly known as:  NORVASC Take 1 tablet (5 mg total) by mouth daily.   amLODipine 5 MG tablet Commonly known as:  NORVASC TAKE 1 TABLET BY MOUTH DAILY   BENADRYL ALLERGY PO Take 25 mg by mouth as needed.   cholecalciferol 1000 units tablet Commonly known as:  VITAMIN D Take 1,000 Units by mouth daily.   EPINEPHrine 0.3 mg/0.3 mL Soaj injection Commonly known as:   EPIPEN 2-PAK Use as directed for severe allergic reaction   fish oil-omega-3 fatty acids 1000 MG capsule Take 1 g by mouth daily.   furosemide 40 MG tablet Commonly known as:  LASIX Take 20 mg by mouth daily.   galantamine 8 MG 24 hr capsule Commonly known as:  RAZADYNE ER Take 8 mg by mouth Daily.   HONEY BEE VENOM IJ Inject as directed.   isosorbide mononitrate 60 MG 24 hr tablet Commonly known as:  IMDUR Take 1 tablet (60 mg total) by mouth daily after supper for 30 days.   NITROSTAT 0.4 MG SL tablet Generic drug:  nitroGLYCERIN Place 1 tablet under the tongue every 5 (five) minutes x 3 doses as needed for chest pain.   pantoprazole 40 MG tablet Commonly known as:  PROTONIX Take 40 mg by mouth every other day.   potassium chloride 10 MEQ CR capsule Commonly known as:  MICRO-K Take 1 capsule (10 mEq total) by mouth daily. With Lasix   psyllium 58.6 % powder Commonly known as:  METAMUCIL Take 1 packet by mouth daily.   Rivaroxaban 15 MG Tabs tablet Commonly known as:  XARELTO Take 1 tablet (15 mg total) by mouth daily with supper.   rosuvastatin 20 MG tablet Commonly known as:  CRESTOR Take 10 mg by mouth daily.   telmisartan 40 MG tablet Commonly known as:  MICARDIS 1/2 tab by mouth once daily   triamcinolone cream 0.1 % Commonly known as:  KENALOG Apply 1 application topically as needed (dry skin irritation).   vitamin B-12 1000 MCG tablet Commonly known as:  CYANOCOBALAMIN Take 1,000 mcg by mouth daily.   ZETIA 10 MG tablet Generic drug:  ezetimibe Take 10 mg by mouth Daily.       Past Surgical History:  He  has a past surgical history that includes Cardiac catheterization; Prostatectomy; Cataract extraction, bilateral; Coronary artery bypass graft (N/A, 03/06/2012); MAZE (N/A, 03/06/2012); Intraoprative transesophageal echocardiogram (N/A, 03/06/2012); Endoharvest vein of greater saphenous vein (Right, 03/06/2012); Cholecystectomy (N/A, 03/24/2012); and  left heart cath (Right, 03/02/2012).  Family History:  His family history includes Dementia in his mother; Stroke in his brother.  Social History:  He  reports that he quit smoking about 55 years ago. He has never used smokeless tobacco. He reports current alcohol use of about 1.0 standard drinks of alcohol per week. He reports that he does not use drugs.

## 2018-02-18 ENCOUNTER — Encounter: Payer: Self-pay | Admitting: Pulmonary Disease

## 2018-02-18 DIAGNOSIS — J449 Chronic obstructive pulmonary disease, unspecified: Secondary | ICD-10-CM | POA: Diagnosis not present

## 2018-02-18 DIAGNOSIS — R0902 Hypoxemia: Secondary | ICD-10-CM | POA: Diagnosis not present

## 2018-02-24 ENCOUNTER — Other Ambulatory Visit: Payer: Self-pay

## 2018-02-24 MED ORDER — FUROSEMIDE 40 MG PO TABS: 20.0000 mg | ORAL_TABLET | Freq: Every day | ORAL | 2 refills | Status: DC

## 2018-02-27 ENCOUNTER — Other Ambulatory Visit: Payer: Self-pay

## 2018-02-27 MED ORDER — FUROSEMIDE 40 MG PO TABS: 40.0000 mg | ORAL_TABLET | Freq: Every day | ORAL | 2 refills | Status: DC

## 2018-03-05 ENCOUNTER — Other Ambulatory Visit: Payer: Self-pay | Admitting: Thoracic Surgery (Cardiothoracic Vascular Surgery)

## 2018-03-05 DIAGNOSIS — I712 Thoracic aortic aneurysm, without rupture, unspecified: Secondary | ICD-10-CM

## 2018-03-07 ENCOUNTER — Other Ambulatory Visit: Payer: Self-pay

## 2018-03-07 MED ORDER — POTASSIUM CHLORIDE ER 10 MEQ PO CPCR: 10.0000 meq | ORAL_CAPSULE | Freq: Every day | ORAL | 1 refills | Status: DC

## 2018-03-21 ENCOUNTER — Telehealth: Payer: Self-pay | Admitting: Pulmonary Disease

## 2018-03-21 NOTE — Telephone Encounter (Signed)
Unable to reach LMTCB 

## 2018-03-24 ENCOUNTER — Ambulatory Visit: Payer: Medicare Other | Admitting: Allergy

## 2018-03-24 ENCOUNTER — Ambulatory Visit (INDEPENDENT_AMBULATORY_CARE_PROVIDER_SITE_OTHER): Payer: Medicare Other | Admitting: *Deleted

## 2018-03-24 ENCOUNTER — Ambulatory Visit: Payer: Self-pay

## 2018-03-24 DIAGNOSIS — T63441D Toxic effect of venom of bees, accidental (unintentional), subsequent encounter: Secondary | ICD-10-CM

## 2018-03-24 NOTE — Telephone Encounter (Signed)
LVM for patient to return call regarding results.X1

## 2018-03-24 NOTE — Telephone Encounter (Signed)
Called patient, unable to reach LMTCB 

## 2018-03-24 NOTE — Telephone Encounter (Signed)
Pt is calling back [phone]

## 2018-03-25 NOTE — Telephone Encounter (Signed)
Called and spoke to pt, who is requesting ONO results from 02/18/18. I have spoken to Darwin with [employer] and requested that results be faxed to our office.  Ailene Ravel stated that it appears pt's name was entered incorrectly on ONO results. Ailene Ravel will send a message to have this corrected by interpreter department.   Will route to triage to hold until results are received.

## 2018-03-25 NOTE — Telephone Encounter (Signed)
Attempted to call pt but unable to reach him. Left message for pt to return call. 

## 2018-03-26 ENCOUNTER — Telehealth: Payer: Self-pay | Admitting: *Deleted

## 2018-03-26 NOTE — Telephone Encounter (Signed)
ONO with RA 02/18/18 >> test time 8 hrs 15 min.  Baseline SpO2 92%, low SpO2 83%.  Spent 16 min 24 sec with SpO2 < 88%.   Please let him know that his home care company misspelled his name and that was likely the reason for delay in receiving results.  Please also check with him about whether he did ONO with CPAP or room air.  Order was for ONO with CPAP, but report says ONO with room air.

## 2018-03-26 NOTE — Telephone Encounter (Signed)
Kell- did you see these results in Dr Juanetta Gosling inbox up front? Please advise and if not I will call [employer] back again, thanks

## 2018-03-26 NOTE — Telephone Encounter (Signed)
rec'd the ONO results on pt today from [facility] Placed in VS look at green folder for review  VS please advise, thank you.

## 2018-03-27 NOTE — Telephone Encounter (Signed)
Spoke with pt. He is aware of his results. States that he did the ONO while on his CPAP.  Dr. Halford Chessman - please advise. Thanks.

## 2018-03-28 NOTE — Telephone Encounter (Signed)
Let him know that ONO was okay.  No change to current set up needed.  He should continue wearing CPAP at night.

## 2018-03-28 NOTE — Telephone Encounter (Signed)
Spoke with pt's wife, [name]. She is aware of Dr. Juanetta Gosling response. Nothing further was needed.

## 2018-04-14 ENCOUNTER — Other Ambulatory Visit: Payer: Medicare Other

## 2018-04-14 ENCOUNTER — Ambulatory Visit: Payer: Medicare Other | Admitting: Thoracic Surgery (Cardiothoracic Vascular Surgery)

## 2018-04-21 ENCOUNTER — Telehealth: Payer: Self-pay

## 2018-04-21 NOTE — Telephone Encounter (Signed)
Pt called left vm stating that lately he has been having irreg hr on occasions and dizziness; He wants to know if you can maybe switch some of his meds;   Please forward this message to Baxter Flattery or April please I will not be in the office tomorrow; Thanks

## 2018-04-21 NOTE — Telephone Encounter (Signed)
He can take Amlodipine at night and see how he does

## 2018-04-22 ENCOUNTER — Telehealth: Payer: Self-pay

## 2018-04-22 NOTE — Telephone Encounter (Signed)
I did and reviewed it. Keep me posted if any issues

## 2018-04-22 NOTE — Telephone Encounter (Signed)
I spoke to pt and advised him to take amlodipine at night for a few days and call us back.   Also he wanted to know if you have received his sleep study results??

## 2018-05-13 ENCOUNTER — Ambulatory Visit: Payer: Medicare Other | Admitting: Pulmonary Disease

## 2018-05-16 ENCOUNTER — Ambulatory Visit: Payer: Self-pay

## 2018-05-19 ENCOUNTER — Other Ambulatory Visit: Payer: Self-pay

## 2018-05-19 ENCOUNTER — Ambulatory Visit (INDEPENDENT_AMBULATORY_CARE_PROVIDER_SITE_OTHER): Payer: Medicare Other

## 2018-05-19 DIAGNOSIS — T63441D Toxic effect of venom of bees, accidental (unintentional), subsequent encounter: Secondary | ICD-10-CM | POA: Diagnosis not present

## 2018-05-28 ENCOUNTER — Other Ambulatory Visit: Payer: Self-pay | Admitting: Internal Medicine

## 2018-05-28 DIAGNOSIS — I712 Thoracic aortic aneurysm, without rupture: Secondary | ICD-10-CM | POA: Diagnosis not present

## 2018-05-28 DIAGNOSIS — M5030 Other cervical disc degeneration, unspecified cervical region: Secondary | ICD-10-CM

## 2018-05-28 DIAGNOSIS — F0390 Unspecified dementia without behavioral disturbance: Secondary | ICD-10-CM | POA: Diagnosis not present

## 2018-05-28 DIAGNOSIS — I350 Nonrheumatic aortic (valve) stenosis: Secondary | ICD-10-CM | POA: Diagnosis not present

## 2018-05-28 DIAGNOSIS — I77810 Thoracic aortic ectasia: Secondary | ICD-10-CM | POA: Diagnosis not present

## 2018-06-07 ENCOUNTER — Other Ambulatory Visit: Payer: Self-pay

## 2018-06-07 ENCOUNTER — Ambulatory Visit
Admission: RE | Admit: 2018-06-07 | Discharge: 2018-06-07 | Disposition: A | Discharge status: Home / Self Care | Payer: Medicare Other | Source: Ambulatory Visit | Attending: Internal Medicine | Admitting: Internal Medicine

## 2018-06-07 DIAGNOSIS — M4802 Spinal stenosis, cervical region: Secondary | ICD-10-CM | POA: Diagnosis not present

## 2018-06-07 DIAGNOSIS — M5030 Other cervical disc degeneration, unspecified cervical region: Secondary | ICD-10-CM

## 2018-06-08 ENCOUNTER — Other Ambulatory Visit: Payer: Self-pay | Admitting: Cardiology

## 2018-06-11 DIAGNOSIS — Z79899 Other long term (current) drug therapy: Secondary | ICD-10-CM | POA: Diagnosis not present

## 2018-06-11 DIAGNOSIS — E538 Deficiency of other specified B group vitamins: Secondary | ICD-10-CM | POA: Diagnosis not present

## 2018-06-11 DIAGNOSIS — R7989 Other specified abnormal findings of blood chemistry: Secondary | ICD-10-CM | POA: Diagnosis not present

## 2018-06-11 DIAGNOSIS — R7301 Impaired fasting glucose: Secondary | ICD-10-CM | POA: Diagnosis not present

## 2018-06-11 DIAGNOSIS — N183 Chronic kidney disease, stage 3 (moderate): Secondary | ICD-10-CM | POA: Diagnosis not present

## 2018-06-12 ENCOUNTER — Other Ambulatory Visit: Payer: Self-pay | Admitting: Internal Medicine

## 2018-06-12 DIAGNOSIS — M5030 Other cervical disc degeneration, unspecified cervical region: Secondary | ICD-10-CM

## 2018-06-17 DIAGNOSIS — G4733 Obstructive sleep apnea (adult) (pediatric): Secondary | ICD-10-CM | POA: Diagnosis not present

## 2018-06-17 DIAGNOSIS — I259 Chronic ischemic heart disease, unspecified: Secondary | ICD-10-CM | POA: Diagnosis not present

## 2018-07-08 NOTE — Discharge Instructions (Signed)

## 2018-07-09 ENCOUNTER — Ambulatory Visit
Admission: RE | Admit: 2018-07-09 | Discharge: 2018-07-09 | Disposition: A | Discharge status: Home / Self Care | Payer: Medicare Other | Source: Ambulatory Visit | Attending: Internal Medicine | Admitting: Internal Medicine

## 2018-07-09 ENCOUNTER — Other Ambulatory Visit: Payer: Self-pay

## 2018-07-09 DIAGNOSIS — M542 Cervicalgia: Secondary | ICD-10-CM | POA: Diagnosis not present

## 2018-07-09 DIAGNOSIS — M5033 Other cervical disc degeneration, cervicothoracic region: Secondary | ICD-10-CM | POA: Diagnosis not present

## 2018-07-09 DIAGNOSIS — M5030 Other cervical disc degeneration, unspecified cervical region: Secondary | ICD-10-CM

## 2018-07-09 MED ORDER — TRIAMCINOLONE ACETONIDE 40 MG/ML IJ SUSP (RADIOLOGY)
60.0000 mg | Freq: Once | INTRAMUSCULAR | Status: AC
  Administered 2018-07-09: 12:00:00 60 mg via EPIDURAL

## 2018-07-09 MED ORDER — IOPAMIDOL (ISOVUE-M 300) INJECTION 61%
1.0000 mL | Freq: Once | INTRAMUSCULAR | Status: AC | PRN
  Administered 2018-07-09: 12:00:00 1 mL via EPIDURAL

## 2018-07-14 ENCOUNTER — Other Ambulatory Visit: Payer: Self-pay

## 2018-07-14 ENCOUNTER — Ambulatory Visit (INDEPENDENT_AMBULATORY_CARE_PROVIDER_SITE_OTHER): Payer: Medicare Other

## 2018-07-14 DIAGNOSIS — T63441D Toxic effect of venom of bees, accidental (unintentional), subsequent encounter: Secondary | ICD-10-CM | POA: Diagnosis not present

## 2018-08-07 ENCOUNTER — Telehealth: Payer: Self-pay | Admitting: Allergy and Immunology

## 2018-08-07 NOTE — Telephone Encounter (Signed)
Pt called and needs to have epi-pen called into [facility] on [address]. [phone].

## 2018-08-07 NOTE — Telephone Encounter (Signed)
Called patient will not refill epipen until seen. telemed visit made for tomorrow at 1:30pm with Gareth Morgan, NP. He is a venom shot patient but has not been seen by a provider in over 4 yrs. We will refill epipen at visit

## 2018-08-08 ENCOUNTER — Ambulatory Visit (INDEPENDENT_AMBULATORY_CARE_PROVIDER_SITE_OTHER): Payer: Medicare Other | Admitting: Family Medicine

## 2018-08-08 ENCOUNTER — Encounter: Payer: Self-pay | Admitting: Family Medicine

## 2018-08-08 DIAGNOSIS — T63441D Toxic effect of venom of bees, accidental (unintentional), subsequent encounter: Secondary | ICD-10-CM

## 2018-08-08 MED ORDER — EPINEPHRINE 0.3 MG/0.3ML IJ SOAJ: 0.3000 mg | INTRAMUSCULAR | 2 refills | Status: DC | PRN

## 2018-08-08 NOTE — Progress Notes (Signed)
RE: VELMER WOELFEL MRN: [id number] DOB: December 09, 1926 Date of Telemedicine Visit: 08/08/2018  Referring provider: [name], MD Primary care provider: [name], MD  Chief Complaint: Medication Refill   Telemedicine Follow Up Visit via Telephone: I connected with [name] [name] for a follow up on 08/08/18 by telephone and verified that I am speaking with the correct person using two identifiers.   I discussed the limitations, risks, security and privacy concerns of performing an evaluation and management service by telephone and the availability of in person appointments. I also discussed with the patient that there may be a patient responsible charge related to this service. The patient expressed understanding and agreed to proceed.  Patient is at home  Provider is at the office.  Visit start time: 1:36 Visit end time: 1:50  History of Present Illness: He is a 85 y.o. male, who is being followed for allergy to stinging insects. His previous allergy office visit was with Dr. [name] who has since left the practice. At today's visit, he reports that his venom injections (mixed vespid and wasp once every 8 weeks) are going well with no local or systemic reactions. He reports 2 separate incidents where he was working in the yard and was stung twice each time by wasps. He denies reactions to these stings. He continues to have access to his EpiPen. His current medications are listed in the chart.    Assessment and Plan: Toxic effect of venom Continue allergen immunotherapy directed toward stinging insect venom Continue to have access to EpiPen at all times  Call the clinic if this treatment plan is not working well for you  Follow up in 1 year or sooner if needed  Return in about 1 year (around 08/08/2019), or if symptoms worsen or fail to improve.  Meds ordered this encounter  Medications  . EPINEPHrine 0.3 mg/0.3 mL IJ SOAJ injection    Sig: Inject 0.3 mLs (0.3 mg total) into the  muscle as needed for anaphylaxis.    Dispense:  2 each    Refill:  2     Medication List:  Current Outpatient Medications  Medication Sig Dispense Refill  . allopurinol (ZYLOPRIM) 300 MG tablet Take 150 mg by mouth Daily.    Marland Kitchen amLODipine (NORVASC) 5 MG tablet TAKE 1 TABLET BY MOUTH DAILY 30 tablet 1  . amLODipine (NORVASC) 5 MG tablet Take 1 tablet (5 mg total) by mouth daily. 90 tablet 1  . cholecalciferol (VITAMIN D) 1000 UNITS tablet Take 1,000 Units by mouth daily.    . DiphenhydrAMINE HCl (BENADRYL ALLERGY PO) Take 25 mg by mouth as needed.     . fish oil-omega-3 fatty acids 1000 MG capsule Take 1 g by mouth daily.     . furosemide (LASIX) 40 MG tablet Take 1 tablet (40 mg total) by mouth daily. 90 tablet 2  . galantamine (RAZADYNE ER) 8 MG 24 hr capsule Take 8 mg by mouth Daily.    . HONEY BEE VENOM IJ Inject as directed.    . isosorbide mononitrate (IMDUR) 60 MG 24 hr tablet TAKE 1 TABLET(60 MG) BY MOUTH DAILY AFTER SUPPER 30 tablet 2  . NITROSTAT 0.4 MG SL tablet Place 1 tablet under the tongue every 5 (five) minutes x 3 doses as needed for chest pain.     . pantoprazole (PROTONIX) 40 MG tablet Take 40 mg by mouth every other day.   4  . potassium chloride (MICRO-K) 10 MEQ CR capsule Take 1 capsule (10  mEq total) by mouth daily. With Lasix 90 capsule 1  . psyllium (METAMUCIL) 58.6 % powder Take 1 packet by mouth daily.    . rivaroxaban (XARELTO) 15 MG TABS tablet Take 1 tablet (15 mg total) by mouth daily with supper. (Patient taking differently: Take 20 mg by mouth daily with supper. ) 30 tablet 1  . rosuvastatin (CRESTOR) 20 MG tablet Take 10 mg by mouth daily.    Marland Kitchen telmisartan (MICARDIS) 40 MG tablet 1/2 tab by mouth once daily    . triamcinolone cream (KENALOG) 0.1 % Apply 1 application topically as needed (dry skin irritation).     . vitamin B-12 (CYANOCOBALAMIN) 1000 MCG tablet Take 1,000 mcg by mouth daily.     Marland Kitchen ZETIA 10 MG tablet Take 10 mg by mouth Daily.    Marland Kitchen  EPINEPHrine 0.3 mg/0.3 mL IJ SOAJ injection Inject 0.3 mLs (0.3 mg total) into the muscle as needed for anaphylaxis. 2 each 2   No current facility-administered medications for this visit.    Allergies: Allergies  Allergen Reactions  . Amoxicillin Other (See Comments)    Headache, debilitated  . Bee Venom Anaphylaxis  . Morphine And Related Other (See Comments)    hypotension   I reviewed his past medical history, social history, family history, and environmental history and no significant changes have been reported from previous visit on 07/14/2018.  Objective: Physical Exam Not obtained as encounter was done via telephone.   Previous notes and tests were reviewed.  I discussed the assessment and treatment plan with the patient. The patient was provided an opportunity to ask questions and all were answered. The patient agreed with the plan and demonstrated an understanding of the instructions.   The patient was advised to call back or seek an in-person evaluation if the symptoms worsen or if the condition fails to improve as anticipated.  I provided 14 minutes of non-face-to-face time during this encounter.  It was my pleasure to participate in Jerimey Burridge care today. Please feel free to contact me with any questions or concerns.   Sincerely,  Gareth Morgan, FNP

## 2018-08-08 NOTE — Patient Instructions (Addendum)
Toxic effect of venom Continue allergen immunotherapy directed toward stinging insect venom Continue to have access to EpiPen at all times  Call the clinic if this treatment plan is not working well for you  Follow up in 1 year or sooner if needed

## 2018-08-11 ENCOUNTER — Other Ambulatory Visit: Payer: Self-pay

## 2018-08-11 ENCOUNTER — Ambulatory Visit: Payer: Medicare Other | Admitting: Cardiology

## 2018-08-11 ENCOUNTER — Encounter: Payer: Self-pay | Admitting: Cardiology

## 2018-08-11 VITALS — BP 123/67 | HR 61 | Ht 73.0 in | Wt 246.5 lb

## 2018-08-11 DIAGNOSIS — I712 Thoracic aortic aneurysm, without rupture: Secondary | ICD-10-CM | POA: Diagnosis not present

## 2018-08-11 DIAGNOSIS — I10 Essential (primary) hypertension: Secondary | ICD-10-CM

## 2018-08-11 DIAGNOSIS — Z951 Presence of aortocoronary bypass graft: Secondary | ICD-10-CM

## 2018-08-11 DIAGNOSIS — I25118 Atherosclerotic heart disease of native coronary artery with other forms of angina pectoris: Secondary | ICD-10-CM | POA: Diagnosis not present

## 2018-08-11 DIAGNOSIS — I7121 Aneurysm of the ascending aorta, without rupture: Secondary | ICD-10-CM

## 2018-08-11 DIAGNOSIS — I480 Paroxysmal atrial fibrillation: Secondary | ICD-10-CM

## 2018-08-11 NOTE — Progress Notes (Signed)
Primary Physician/Referring:  [name], MD  Patient ID: [name] [name], male    DOB: 14-Jul-1926, 85 y.o.   MRN: [id number]  Chief Complaint  Patient presents with   Coronary Artery Disease   Hypertension   Follow-up    67mo  HPI:    HPI: [name] [name] is a 85y.o. Caucasian male with vitamin B12 deficiency, mild dementia, hyperlipidemia, hypertension, CAD, chronic stable angina status post CABG x 3 and Maze and LAA amputation in 2014, h/o atrial fibrillation (no documentation in Epic), obstructive sleep apnea not currently on CPAP, mild aortic stenosis by echo in 2018 with thoracic aortic aneurysm and dilation of ascending aorta and follows Dr. CLilly Cove(No correlation with TTE with regards to aneurysm), venous stasis edema.   He is using support stocking regularly. His main complaint is dizziness when he is busy and active.   Past Medical History:  Diagnosis Date   AAA (abdominal aortic aneurysm) ([facility]    Acute coronary syndrome ([facility] 24/23/9532  Acute systolic heart failure ([facility] 03/03/2012   Angina    Ascending aorta dilatation ([facility] 03/13/2012   Fusiform dilatation of the ascending thoracic aorta discovered during surgery, max diameter 4.2-4.3 cm   Atrial fibrillation (HCC) 02/29/2012   Recurrent paroxysmal, new-onset   BPH (benign prostatic hypertrophy)    CAD (coronary artery disease)    Cath April 2012 40% left main, 50% LAD, 40% OM, occluded RCA with L-R collaterals    CHF (congestive heart failure) (HCC)    Chronic venous insufficiency    Dyspnea    Glaucoma    Gout    Hyperlipidemia    Hypertension    Kidney stones    MI (myocardial infarction) ([facility]    Obesity (BMI 30-39.9)    Paroxysmal atrial fibrillation (HCC) 02/29/2012   Recurrent paroxysmal, new-onset    S/P CABG x 3 03/06/2012   LIMA to LAD, SVG to OM, SVG to RCA, EVH via right thigh   S/P Maze operation for atrial fibrillation 03/06/2012   Complete bilateral lesions set using  bipolar radiofrequency and cryothermy ablation with clipping of LA appendage   Past Surgical History:  Procedure Laterality Date   CARDIAC CATHETERIZATION     CATARACT EXTRACTION, BILATERAL     CHOLECYSTECTOMY N/A 03/24/2012   Procedure: LAPAROSCOPIC CHOLECYSTECTOMY WITH INTRAOPERATIVE CHOLANGIOGRAM;  Surgeon: BZenovia Jarred MD;  [place] [facility]  Service: General;  Laterality: N/A;   CORONARY ARTERY BYPASS GRAFT N/A 03/06/2012   Procedure: CORONARY ARTERY BYPASS GRAFTING (CABG);  Surgeon: CRexene Alberts MD;  [facility]  Service: Open Heart Surgery;  Laterality: N/A;   ENDOVEIN HARVEST OF GREATER SAPHENOUS VEIN Right 03/06/2012   Procedure: ENDOVEIN HARVEST OF GREATER SAPHENOUS VEIN;  Surgeon: CRexene Alberts MD;  [place] [facility]  Service: Open Heart Surgery;  Laterality: Right;   INTRAOPERATIVE TRANSESOPHAGEAL ECHOCARDIOGRAM N/A 03/06/2012   Procedure: INTRAOPERATIVE TRANSESOPHAGEAL ECHOCARDIOGRAM;  Surgeon: CRexene Alberts MD;  [place] [facility]  Service: Open Heart Surgery;  Laterality: N/A;   LEFT HEART CATH Right 03/02/2012   Procedure: LEFT HEART CATH;  Surgeon: MSherren Mocha MD;  [facility];  Service: Cardiovascular;  Laterality: Right;   MAZE N/A 03/06/2012   Procedure: MAZE;  Surgeon: CRexene Alberts MD;  [place] [facility]  Service: Open Heart Surgery;  Laterality: N/A;   PROSTATECTOMY     Social History   Socioeconomic History   Marital status: Married    Spouse name:  Not on file   Number of children: 4   Years of education: Not on file   Highest education level: Not on file  Occupational History   Occupation: retired    Comment: Automotive engineer strain: Not on file   Food insecurity    Worry: Not on file    Inability: Not on file   Transportation needs    Medical: Not on file    Non-medical: Not on file  Tobacco Use   Smoking status: Former Smoker    Packs/day: 0.50    Years: 15.00    Pack years:  7.50    Types: Cigarettes    Quit date: 11/18/1962    Years since quitting: 55.7   Smokeless tobacco: Never Used  Substance and Sexual Activity   Alcohol use: Yes    Alcohol/week: 1.0 standard drinks    Types: 1 Shots of liquor per week    Comment: daily   Drug use: Never   Sexual activity: Yes  Lifestyle   Physical activity    Days per week: Not on file    Minutes per session: Not on file   Stress: Not on file  Relationships   Social connections    Talks on phone: Not on file    Gets together: Not on file    Attends religious service: Not on file    Active member of club or organization: Not on file    Attends meetings of clubs or organizations: Not on file    Relationship status: Not on file   Intimate partner violence    Fear of current or ex partner: Not on file    Emotionally abused: Not on file    Physically abused: Not on file    Forced sexual activity: Not on file  Other Topics Concern   Not on file  Social History Narrative   Lives with wife in [place].     Very active physically without significant limitations.    Exercises regularly   ROS  Review of Systems  Constitution: Positive for malaise/fatigue. Negative for chills, decreased appetite and weight gain.  Cardiovascular: Positive for leg swelling. Negative for dyspnea on exertion and syncope.  Endocrine: Negative for cold intolerance.  Hematologic/Lymphatic: Does not bruise/bleed easily.  Musculoskeletal: Positive for back pain, joint pain and neck pain. Negative for joint swelling.  Gastrointestinal: Negative for abdominal pain, anorexia, change in bowel habit, hematochezia and melena.  Neurological: Positive for dizziness. Negative for headaches and light-headedness.  Psychiatric/Behavioral: Negative for depression and substance abuse.  All other systems reviewed and are negative.  Objective  Blood pressure 123/67, pulse 61, height _0  (1.854 m), weight 246 lb 8 oz (111.8 kg), SpO2 96 %.  Body mass index is 32.52 kg/m.    Flowsheets     08/11/18 11:35 AM  08/11/18 12:31 PM  08/11/18 12:42 PM   BP  128/66  133/66  123/67   BP Location  LeftArm  RightArm  LeftArm   Patient Position  Sitting  Sitting  Sitting   Cuff Size  Large  Normal  Normal   Pulse  75  74  61   SpO2  98%  95%  96%   Weight  246lb8oz(111.8kg)     Height  6'1"(1.862m        Physical Exam  Constitutional: He appears well-developed and well-nourished. No distress.  HENT:  Head: Atraumatic.  Eyes: Conjunctivae are normal.  Neck: Neck supple. No JVD  present. No thyromegaly present.  Cardiovascular: Normal rate and regular rhythm. Exam reveals no gallop.  Murmur heard.  Scratchy midsystolic murmur is present with a grade of 3/6 at the upper right sternal border. Pulses:      Carotid pulses are 2+ on the right side and 2+ on the left side.      Radial pulses are 2+ on the right side and 2+ on the left side.       Femoral pulses are 2+ on the right side and 2+ on the left side.      Popliteal pulses are 2+ on the right side and 2+ on the left side.       Dorsalis pedis pulses are 0 on the right side and 1+ on the left side.       Posterior tibial pulses are 0 on the right side and 0 on the left side.  Vascular examination reveals bilateral pigmented lower extremities with varicose veins.  2+ pitting edema.  Pulmonary/Chest: Effort normal and breath sounds normal.  Abdominal: Soft. Bowel sounds are normal.  Musculoskeletal: Normal range of motion.        General: No edema.  Neurological: He is alert.  Skin: Skin is warm and dry.  Psychiatric: He has a normal mood and affect.   Radiology: No results found.  Laboratory examination:   11/19/2017: Creatinine 1.6, EGFR 40/49, potassium 4.1, CMP otherwise normal.  CBC normal.  Cholesterol 161, triglycerides 160, HDL 55, LDL 74. Apolipoprotein B 86. TSH 2.69.  CMP Latest Ref Rng & Units 04/22/2015 03/08/2015 03/04/2015  Glucose  65 - 99 mg/dL - 113(H) 171(H)  BUN 6 - 20 mg/dL - 13 16  Creatinine 0.6 - 1.3 mg/dL 1.1 1.44(H) 1.25(H)  Sodium 135 - 145 mmol/L - 135 137  Potassium 3.5 - 5.1 mmol/L - 4.2 4.1  Chloride 101 - 111 mmol/L - 97(L) 101  CO2 22 - 32 mmol/L - 26 24  Calcium 8.9 - 10.3 mg/dL - 9.5 9.5  Total Protein 6.0 - 8.3 g/dL - - -  Total Bilirubin 0.3 - 1.2 mg/dL - - -  Alkaline Phos 39 - 117 U/L - - -  AST 0 - 37 U/L - - -  ALT 0 - 53 U/L - - -   CBC Latest Ref Rng & Units 03/08/2015 03/04/2015 10/24/2014  WBC 4.0 - 10.5 K/uL 6.2 4.7 4.5  Hemoglobin 13.0 - 17.0 g/dL 12.6(L) 12.2(L) 12.2(L)  Hematocrit 39.0 - 52.0 % 37.2(L) 38.0(L) 37.1(L)  Platelets 150 - 400 K/uL 167 143(L) 171   Lipid Panel     Component Value Date/Time   CHOL 84 03/03/2012 0751   TRIG 75 03/03/2012 0751   HDL 40 03/03/2012 0751   CHOLHDL 2.1 03/03/2012 0751   VLDL 15 03/03/2012 0751   LDLCALC 29 03/03/2012 0751   HEMOGLOBIN A1C Lab Results  Component Value Date   HGBA1C 6.1 (H) 03/03/2012   MPG 128 (H) 03/03/2012   TSH No results for input(s): TSH in the last 8760 hours. Medications   Current Outpatient Medications  Medication Instructions   allopurinol (ZYLOPRIM) 150 mg, Daily   amLODipine (NORVASC) 5 MG tablet TAKE 1 TABLET BY MOUTH DAILY   cholecalciferol (VITAMIN D) 1,000 Units, Daily   DiphenhydrAMINE HCl (BENADRYL ALLERGY PO) 25 mg, As needed   EPINEPHrine (EPI-PEN) 0.3 mg, Intramuscular, As needed   fish oil-omega-3 fatty acids 1 g, Daily   furosemide (LASIX) 40 mg, Oral, Daily   galantamine (RAZADYNE ER) 8  mg, Daily   HONEY BEE VENOM IJ Injection   isosorbide mononitrate (IMDUR) 60 MG 24 hr tablet TAKE 1 TABLET(60 MG) BY MOUTH DAILY AFTER SUPPER   naftifine (NAFTIN) 1 % cream Topical, As needed   NITROSTAT 0.4 MG SL tablet 1 tablet, Every 5 min x3 PRN   pantoprazole (PROTONIX) 40 mg, Oral, Every other day   potassium chloride (MICRO-K) 10 MEQ CR capsule 10 mEq, Oral, Daily, With Lasix    psyllium (METAMUCIL) 58.6 % powder 1 packet, Oral, Daily   Rivaroxaban (XARELTO) 15 mg, Oral, Daily with supper   rosuvastatin (CRESTOR) 10 mg, Daily   telmisartan (MICARDIS) 40 MG tablet 1/2 tab by mouth once daily    triamcinolone cream (KENALOG) 0.1 % 1 application, As needed   vitamin B-12 (CYANOCOBALAMIN) 1,000 mcg, Daily   Zetia 10 mg, Daily    Cardiac Studies:   Coronary angiogram 03/02/2012: 50-60% left main, 99% LAD, 40% OM, occluded RCA with L-R collaterals  Nuclear stress test   11/22/2017: 1. Lexiscan stress test with low level exercise was performed. Exercise capacity was not assessed. Stress symptoms included dyspnea. Peak effect blood pressure was 114/66 mmHg. The resting and stress electrocardiogram demonstrated normal sinus rhythm, RBBB, no resting arrhythmias and normal rest repolarization. Stress EKG is non diagnostic for ischemia as it is a pharmacologic stress. 2. The overall quality of the study is good. Left ventricular cavity is noted to be mildly enlarged on the rest and stress studies. Gated SPECT imaging demonstrated mild hypokinesis of the basal inferolateral and mid inferolateral myocardial wall(s). The left ventricular ejection fraction was calculated or visually estimated to be 69%. Medium sized, medium intensity, fixed perfusion defect in mid to basal inferolateral myocardium suggests old LCx territory infarct with no periinfarct ischemia. 3. Low risk study.  Echocardiogram   11/25/2017: Left ventricle cavity is normal in size. Moderate concentric hypertrophy of the left ventricle. Normal global wall motion. Doppler evidence of grade III (restrictive) diastolic dysfunction, elevated LAP. Due to MV apparatus calcification, diastolic function calculation may be inaccurate. Calculated EF 56%. Severe calcification of the aortic valve annulus. Trileaflet aortic valve with mild (Grade I) regurgitation. Severe aortic valve leaflet calcification. Moderately  restricted aortic valve leaflets. Mild aortic valve stenosis. Aortic valve peak pressure gradient of 29 and mean gradient of 18 mmHg, calculated aortic valve area 1.26 cm. Mild (Grade I) mitral regurgitation. Moderate calcification of the mitral valve annulus. Moderate mitral valve leaflet calcification. Normal mitral valve leaflet mobility. Mild tricuspid regurgitation. Mild to moderate pulmonary hypertension. Estimated pulmonary artery systolic pressure 42 mm Hg The aortic root is normal in size. Moderate atherosclerotic changes in the aorta. IVC is dilated with poor inspiration collapse consistent with elevated right atrial pressure. Compared to 03/05/2016, no significant change.  CTA of chest 04/29/2017: Stable ascending thoracic aortic aneurysm measuring up to 4.3 cm. Atherosclerotic changes thoracic aorta with irregular plaque/small ulceration thoracic-abdominal aortic junction similar to prior exam.   Atherosclerotic changes origin of great vessels most notable involving the left subclavian artery. Atherosclerotic changes with narrowing superior mesenteric artery and right renal artery. Superior aspect of abdominal aortic aneurysm incompletely assessed.  Assessment     ICD-10-CM   1. Coronary artery disease of native artery of native heart with stable angina pectoris ([place])  I25.118 EKG 12-Lead  2. S/P CABG x 3  Z95.1    LIMA to LAD, SVG to OM, SVG to RCA, with Maze and amputation of left atrial appendage 03/24/2012 with Dr. Roxy Manns  3.  Thoracic ascending aortic aneurysm (HCC)  I71.2   4. Essential hypertension  I10   5. Paroxysmal atrial fibrillation (HCC)  I48.0    CHA2DS2-VASc Score is 4.  Yearly risk of stroke: 4%.        EKG 08/11/2018: Probable sinus rhythm versus ectopic atrial rhythm with first-degree AV block at the rate of 74 bpm, left axis deviation, left anterior fascicular block.  Right bundle branch block.  Trifascicular block.  Compared to 02/06/2018, axis deviation is new but  otherwise no change.  Recommendations:   Patient presents for a 64-monthoffice visit and follow-up of coronary artery disease, his main complaint being dizziness.  He is not orthostatic and there is no blood pressure differential between the upper extremities.  I reviewed the CT scan of the chest and he does indeed have left subclavian artery stenosis or atherosclerosis.  However his symptoms are mild, advised him to continue to watch this and if she continues to have significant dizzy spells while he is physically active, could consider left subclavian arteriogram.  His dizziness could also be coming from borderline low blood pressure as well.  He is now developed trifascicular block, do not suspect high degree AV block, there is no history to suggest sudden onset of dizziness or unexplained dizziness or syncope.  Advised him to lose weight and to exercise on a regular basis.  He has history of A. fib and underwent Maze procedure during CABG, he is presently 85years of age and is tolerating Xarelto, continue the same.  No bleeding diathesis.  With regard to coronary artery disease, he has had stable angina which is nitrate responsive, continue present medical therapy.  Blood pressure is well controlled.  His thoracic aortic aneurysm is being followed by Dr. ORoxy Manns   JAdrian Prows MD, FWinnebago Mental Hlth Institute8/10/2018, 12:59 PM PVenetian VillageCardiovascular. PRail Road FlatPager: [phone] Office: [phone] no answer Cell [phone]

## 2018-08-14 ENCOUNTER — Telehealth: Payer: Self-pay | Admitting: Pulmonary Disease

## 2018-08-14 NOTE — Telephone Encounter (Signed)
Contacted patient by phone and informed him that Dr. Halford Chessman asked [place] to call to schedule OV after receiving message from Dr. Einar Gip regarding the patient having Cpap concerns and also dyspnea.  Patient states he does not feel this is urgent and prefers to wait to see Dr. Halford Chessman at first available date to discuss.   Patient scheduled for 08/29/18 at 11:15 am with Dr. Halford Chessman.  Advised to contact us sooner to see an APP if problems are worsening and needs sooner appt.  Nothing further needed.

## 2018-08-14 NOTE — Telephone Encounter (Signed)
-----   Message from Adrian Prows, MD sent at 08/12/2018  6:36 AM EDT ----- Regarding: RE: OSA Looks like he he having difficulty with CPAP and also dyspnea.  Thanks ----- Message ----- From: Chesley Mires, MD Sent: 08/11/2018   2:31 PM EDT To: Adrian Prows, MD Subject: RE: OSA                                        I saw him last in February of this year.  Looking through my note it seemed like he was doing okay with CPAP and we set up overnight oximetry with CPAP then.  The oximetry looked okay also.  I was only seeing him for sleep apnea.  Is he having more issues with CPAP now?  Or is there another pulmonary issue that needs to be addressed?  Jaiven Graveline  ----- Message ----- From: Adrian Prows, MD Sent: 08/11/2018  12:27 PM EDT To: Chesley Mires, MD Subject: OSA                                            Not seen you in a couple years, may need tweeking

## 2018-08-14 NOTE — Telephone Encounter (Signed)
Will ask my staff to arrange for ROV to assess dyspnea and sleep apnea therapy.

## 2018-08-18 DIAGNOSIS — L578 Other skin changes due to chronic exposure to nonionizing radiation: Secondary | ICD-10-CM | POA: Diagnosis not present

## 2018-08-18 DIAGNOSIS — D229 Melanocytic nevi, unspecified: Secondary | ICD-10-CM | POA: Diagnosis not present

## 2018-08-18 DIAGNOSIS — L821 Other seborrheic keratosis: Secondary | ICD-10-CM | POA: Diagnosis not present

## 2018-08-18 DIAGNOSIS — R238 Other skin changes: Secondary | ICD-10-CM | POA: Diagnosis not present

## 2018-08-18 DIAGNOSIS — D0359 Melanoma in situ of other part of trunk: Secondary | ICD-10-CM | POA: Diagnosis not present

## 2018-08-18 DIAGNOSIS — L814 Other melanin hyperpigmentation: Secondary | ICD-10-CM | POA: Diagnosis not present

## 2018-08-25 ENCOUNTER — Telehealth: Payer: Self-pay

## 2018-08-25 NOTE — Telephone Encounter (Signed)
CT scan suggested that he may have a blockage in the left arm, I think he should continue to exercise and see how he does, only if he has dizziness with using his arms (indicated severe blockage) only then we should consider stent. I prefer medical therapy as it increased the risk of stroke as well (procedure), but can be done if needed. JG

## 2018-08-25 NOTE — Telephone Encounter (Signed)
Pt called stating that you suggested that he have a stent since he has blockage in L shoulder. He wants to do it; Also he wants to let you know that he has been walking 1/3 or a mile a day

## 2018-08-27 NOTE — Telephone Encounter (Signed)
Pt aware.//ah

## 2018-08-29 ENCOUNTER — Other Ambulatory Visit: Payer: Self-pay

## 2018-08-29 ENCOUNTER — Ambulatory Visit: Payer: Medicare Other | Admitting: Pulmonary Disease

## 2018-08-29 ENCOUNTER — Telehealth: Payer: Self-pay | Admitting: Pulmonary Disease

## 2018-08-29 ENCOUNTER — Ambulatory Visit (INDEPENDENT_AMBULATORY_CARE_PROVIDER_SITE_OTHER): Payer: Medicare Other

## 2018-08-29 ENCOUNTER — Encounter: Payer: Self-pay | Admitting: Pulmonary Disease

## 2018-08-29 VITALS — BP 126/74 | HR 72 | Temp 98.2°F | Ht 73.0 in | Wt 245.2 lb

## 2018-08-29 DIAGNOSIS — G4733 Obstructive sleep apnea (adult) (pediatric): Secondary | ICD-10-CM | POA: Diagnosis not present

## 2018-08-29 DIAGNOSIS — R0602 Shortness of breath: Secondary | ICD-10-CM | POA: Diagnosis not present

## 2018-08-29 DIAGNOSIS — Z23 Encounter for immunization: Secondary | ICD-10-CM | POA: Diagnosis not present

## 2018-08-29 LAB — COMPREHENSIVE METABOLIC PANEL
ALT: 13 U/L (ref 0–53)
AST: 18 U/L (ref 0–37)
Albumin: 4.3 g/dL (ref 3.5–5.2)
Alkaline Phosphatase: 43 U/L (ref 39–117)
BUN: 23 mg/dL (ref 6–23)
CO2: 30 mEq/L (ref 19–32)
Calcium: 10 mg/dL (ref 8.4–10.5)
Chloride: 102 mEq/L (ref 96–112)
Creatinine, Ser: 1.34 mg/dL (ref 0.40–1.50)
GFR: 50.05 mL/min — ABNORMAL LOW (ref >60.00)
Glucose, Bld: 101 mg/dL — ABNORMAL HIGH (ref 70–99)
Potassium: 4 mEq/L (ref 3.5–5.1)
Sodium: 140 mEq/L (ref 135–145)
Total Bilirubin: 0.6 mg/dL (ref 0.2–1.2)
Total Protein: 6.9 g/dL (ref 6.0–8.3)

## 2018-08-29 LAB — CBC WITH DIFFERENTIAL/PLATELET
Basophils Absolute: 0 10*3/uL (ref 0.0–0.1)
Basophils Relative: 0.9 % (ref 0.0–3.0)
Eosinophils Absolute: 0.1 10*3/uL (ref 0.0–0.7)
Eosinophils Relative: 1.9 % (ref 0.0–5.0)
HCT: 34.2 % — ABNORMAL LOW (ref 39.0–52.0)
Hemoglobin: 11.6 g/dL — ABNORMAL LOW (ref 13.0–17.0)
Lymphocytes Relative: 21.9 % (ref 12.0–46.0)
Lymphs Abs: 0.9 10*3/uL (ref 0.7–4.0)
MCHC: 33.9 g/dL (ref 30.0–36.0)
MCV: 93.4 fl (ref 78.0–100.0)
Monocytes Absolute: 0.4 10*3/uL (ref 0.1–1.0)
Monocytes Relative: 9.5 % (ref 3.0–12.0)
Neutro Abs: 2.7 10*3/uL (ref 1.4–7.7)
Neutrophils Relative %: 65.8 % (ref 43.0–77.0)
Platelets: 184 10*3/uL (ref 150.0–400.0)
RBC: 3.67 Mil/uL — ABNORMAL LOW (ref 4.22–5.81)
RDW: 15.4 % (ref 11.5–15.5)
WBC: 4 10*3/uL (ref 4.0–10.5)

## 2018-08-29 NOTE — Progress Notes (Signed)
@Patient  ID: [name] [name], male    DOB: June 15, 1926, 85 y.o.   MRN: [id number]  Chief Complaint  Patient presents with  . Follow-up    Short of Breath, CPAP pressure high    Referring provider: [name], MD  HPI:  85 year old male former smoker followed in our office for moderate obstructive sleep apnea  PMH: Hyperlipidemia, hypertension, gout, BPH Smoker/ Smoking History: Former smoker.  Quit 1964. 7.5 pack-year smoking. Maintenance: None Pt of: Dr. Halford Chessman  08/29/2018  - Visit   85 year old male former smoker presenting to our office today as a follow-up visit as he feels that his CPAP pressures may be too high.  He feels that he has to wear his mask way too tight.  CPAP compliance report today shows excellent compliance.  Patient admits that he has not changed his CPAP mask in close to a year.  He has a new one at home that he is planning on switching to.  See CPAP compliance report listed below:  7/24-2020-08/27/2018-CPAP compliance report-30 had a last 30 days use, all 30 those days greater than 4 hours, average usage 8 hours and 52 minutes, APAP pressures 8-15, 95th percentile 11.8, AHI 7.5  Patient reports he has not been started on any new medications, no pain medications and no Benadryl.  Patient is also reporting that he has had worsened dyspnea over the past couple of months.  Patient with lower extremity swelling today.  Patient was recently seen by cardiology earlier this month.  Patient is maintained on 40 mg of Lasix daily.  Last echocardiogram was in 2016.  No recent chest imaging.  Patient has no recent pulmonary function testing.     Tests:   Sleep tests:  PSG 07/29/14 >> AHI 29.5, SaO2 low 88%.  CPAP 12 >> AHI 0 Auto CPAP 01/12/18 to 02/10/18 >> used on 29 of 30 nights with average 8 hrs 48 min.  Average AHI 10 with median CPAP 9 and 95 th percentile CPAP 11 cm H2O  Cardiac tests:  Echo 10/25/14 >> mod LVH, EF 60 to 65%, mild/mod AR, mod LA dilation, PAS 47 mmHg  FENO:  No results found for: NITRICOXIDE  PFT: No flowsheet data found.  Imaging: No results found.    Specialty Problems      Pulmonary Problems   OSA (obstructive sleep apnea)    PSG 07/29/14 >> AHI 29.5, SaO2 low 88%.  CPAP 12 >> AHI 0      Shortness of breath      Allergies  Allergen Reactions  . Amoxicillin Other (See Comments)    Headache, debilitated  . Bee Venom Anaphylaxis  . Morphine And Related Other (See Comments)    hypotension    Immunization History  Administered Date(s) Administered  . Influenza Split 10/12/2017  . Influenza, High Dose Seasonal PF 09/20/2016  . Influenza,inj,Quad PF,6+ Mos 10/29/2014    Past Medical History:  Diagnosis Date  . AAA (abdominal aortic aneurysm) ([place])   . Acute coronary syndrome ([place]) 02/29/2012  . Acute systolic heart failure ([place]) 03/03/2012  . Angina   . Ascending aorta dilatation ([facility]) 03/13/2012   Fusiform dilatation of the ascending thoracic aorta discovered during surgery, max diameter 4.2-4.3 cm  . Atrial fibrillation ([place]) 02/29/2012   Recurrent paroxysmal, new-onset  . BPH (benign prostatic hypertrophy)   . CAD (coronary artery disease)    Cath April 2012 40% left main, 50% LAD, 40% OM, occluded RCA with L-R collaterals   . CHF (congestive  heart failure) ([place])   . Chronic venous insufficiency   . Dyspnea   . Glaucoma   . Gout   . Hyperlipidemia   . Hypertension   . Kidney stones   . MI (myocardial infarction) ([place])   . Obesity (BMI 30-39.9)   . Paroxysmal atrial fibrillation ([facility]) 02/29/2012   Recurrent paroxysmal, new-onset   . S/P CABG x 3 03/06/2012   LIMA to LAD, SVG to OM, SVG to RCA, EVH via right thigh  . S/P Maze operation for atrial fibrillation 03/06/2012   Complete bilateral lesions set using bipolar radiofrequency and cryothermy ablation with clipping of LA appendage    Tobacco History: Social History   Tobacco Use  Smoking Status Former Smoker  . Packs/day: 0.50  . Years: 15.00  .  Pack years: 7.50  . Types: Cigarettes  . Quit date: 11/18/1962  . Years since quitting: 55.8  Smokeless Tobacco Never Used   Counseling given: Yes   Continue to not smoke  Outpatient Encounter Medications as of 08/29/2018  Medication Sig  . allopurinol (ZYLOPRIM) 300 MG tablet Take 150 mg by mouth Daily.  Marland Kitchen amLODipine (NORVASC) 5 MG tablet TAKE 1 TABLET BY MOUTH DAILY  . cholecalciferol (VITAMIN D) 1000 UNITS tablet Take 1,000 Units by mouth daily.  . DiphenhydrAMINE HCl (BENADRYL ALLERGY PO) Take 25 mg by mouth as needed.   Marland Kitchen EPINEPHrine 0.3 mg/0.3 mL IJ SOAJ injection Inject 0.3 mLs (0.3 mg total) into the muscle as needed for anaphylaxis.  . fish oil-omega-3 fatty acids 1000 MG capsule Take 1 g by mouth daily.   . furosemide (LASIX) 40 MG tablet Take 1 tablet (40 mg total) by mouth daily.  Marland Kitchen galantamine (RAZADYNE ER) 8 MG 24 hr capsule Take 8 mg by mouth Daily.  . HONEY BEE VENOM IJ Inject as directed.  . isosorbide mononitrate (IMDUR) 60 MG 24 hr tablet TAKE 1 TABLET(60 MG) BY MOUTH DAILY AFTER SUPPER  . naftifine (NAFTIN) 1 % cream Apply topically as needed.  Marland Kitchen NITROSTAT 0.4 MG SL tablet Place 1 tablet under the tongue every 5 (five) minutes x 3 doses as needed for chest pain.   . pantoprazole (PROTONIX) 40 MG tablet Take 40 mg by mouth every other day.   . potassium chloride (MICRO-K) 10 MEQ CR capsule Take 1 capsule (10 mEq total) by mouth daily. With Lasix  . psyllium (METAMUCIL) 58.6 % powder Take 1 packet by mouth daily.  . rivaroxaban (XARELTO) 15 MG TABS tablet Take 1 tablet (15 mg total) by mouth daily with supper. (Patient taking differently: Take 20 mg by mouth daily with supper. )  . rosuvastatin (CRESTOR) 20 MG tablet Take 10 mg by mouth daily.  Marland Kitchen telmisartan (MICARDIS) 40 MG tablet 1/2 tab by mouth once daily  . triamcinolone cream (KENALOG) 0.1 % Apply 1 application topically as needed (dry skin irritation).   . vitamin B-12 (CYANOCOBALAMIN) 1000 MCG tablet Take  1,000 mcg by mouth daily.   Marland Kitchen ZETIA 10 MG tablet Take 10 mg by mouth Daily.   No facility-administered encounter medications on file as of 08/29/2018.      Review of Systems  Review of Systems  Constitutional: Positive for fatigue. Negative for activity change, chills, fever and unexpected weight change.  HENT: Negative for postnasal drip, rhinorrhea, sinus pressure, sinus pain and sore throat.   Eyes: Negative.   Respiratory: Positive for shortness of breath. Negative for cough and wheezing.   Cardiovascular: Positive for leg swelling. Negative for chest  pain and palpitations.  Gastrointestinal: Negative for constipation, diarrhea, nausea and vomiting.  Endocrine: Negative.   Genitourinary: Negative.   Musculoskeletal: Negative.   Skin: Negative.   Neurological: Negative for dizziness and headaches.  Psychiatric/Behavioral: Positive for sleep disturbance. Negative for dysphoric mood. The patient is not nervous/anxious.   All other systems reviewed and are negative.   Wells Criteria Modified Wells criteria: Clinical assessment for PE Clinical symptoms of DVT (leg swelling, pain with palpation) - 3 Other diagnoses less likely than pulmonary embolism - 3 Heart rate greater than 100 - 1.5 Immobilization (disease greater in 3 days) or surgery in the previous 4 weeks - 1.5 Previous DVT/PE - 1.5 Hemoptysis - 1 Malignancy - 1  Probability Traditional clinical probability assessment (Wells criteria) High - greater than 6 Moderate - 2 to 6 Low less than 2  Simplify clinical probability assessment (modified Wells criteria) PE likely-greater than 4 PE unlikely little less than or equal to 4  Pretest probability of DVT (Wells score)  Clinical features: Active cancer (treatment ongoing or within the previous 6 months or palliative) -1 Paralysis, paralysis, or recent plaster immobilization of the lower extremities -1 Recently bedridden for more than 3 days or major surgery within 4  weeks -1 Localized tenderness along the distribution of the deep vein system -1 Entire leg swollen -1 Calf swelling by more than 3 cm when compared to the asymptomatic leg (measured below tibial tuberosity) -1 Pitting edema (greater and symptomatic leg) -1 Collateral superficial veins -1 Alternative diagnosis as likely or more than likely that of a deep vein thrombosis    -2   High probability - 3 or greater Moderate probability -  1 or 2 Low probability - 0 or less  Modification  DVT likely-2 or greater DVT unlikely-1 or less  Score: 1   Physical Exam  BP 126/74   Pulse 72   Temp 98.2 F (36.8 C) (Temporal)   Ht 6\' 1"  (1.854 m)   Wt 245 lb 3.2 oz (111.2 kg)   SpO2 98%   BMI 32.35 kg/m   Wt Readings from Last 5 Encounters:  08/29/18 245 lb 3.2 oz (111.2 kg)  08/11/18 246 lb 8 oz (111.8 kg)  02/12/18 250 lb 9.6 oz (113.7 kg)  02/06/18 250 lb 4.8 oz (113.5 kg)  12/02/17 247 lb 4 oz (112.2 kg)     Physical Exam Vitals signs and nursing note reviewed.  Constitutional:      General: He is not in acute distress.    Appearance: Normal appearance. He is obese.  HENT:     Head: Normocephalic and atraumatic.     Right Ear: Hearing, tympanic membrane, ear canal and external ear normal.     Left Ear: Hearing, tympanic membrane, ear canal and external ear normal.     Nose: Nose normal. No mucosal edema, congestion or rhinorrhea.     Right Turbinates: Not enlarged.     Left Turbinates: Not enlarged.     Mouth/Throat:     Mouth: Mucous membranes are dry.     Pharynx: Oropharynx is clear. No oropharyngeal exudate.  Eyes:     Pupils: Pupils are equal, round, and reactive to light.  Neck:     Musculoskeletal: Normal range of motion.  Cardiovascular:     Rate and Rhythm: Normal rate and regular rhythm.     Pulses: Normal pulses.     Heart sounds: Normal heart sounds. No murmur.  Pulmonary:     Effort: Pulmonary effort is  normal. No respiratory distress.     Breath  sounds: Normal breath sounds. No decreased breath sounds, wheezing or rales.  Abdominal:     General: Abdomen is flat. Bowel sounds are normal. There is no distension.     Palpations: Abdomen is soft. There is no mass.     Tenderness: There is no abdominal tenderness.     Hernia: No hernia is present.     Comments: Obese  Musculoskeletal:     Right lower leg: Edema (3+ lower extremity swelling) present.     Left lower leg: Edema (2+ lower extremity swelling) present.  Lymphadenopathy:     Cervical: No cervical adenopathy.  Skin:    General: Skin is warm and dry.     Capillary Refill: Capillary refill takes less than 2 seconds.     Findings: No erythema or rash.  Neurological:     General: No focal deficit present.     Mental Status: He is alert and oriented to person, place, and time.     Motor: No weakness.     Coordination: Coordination normal.     Gait: Gait is intact. Gait normal.  Psychiatric:        Mood and Affect: Mood normal.        Behavior: Behavior normal. Behavior is cooperative.        Thought Content: Thought content normal.        Judgment: Judgment normal.      Lab Results:  CBC    Component Value Date/Time   WBC 6.2 03/08/2015 1855   RBC 4.20 (L) 03/08/2015 1855   HGB 12.6 (L) 03/08/2015 1855   HCT 37.2 (L) 03/08/2015 1855   PLT 167 03/08/2015 1855   MCV 88.6 03/08/2015 1855   MCH 30.0 03/08/2015 1855   MCHC 33.9 03/08/2015 1855   RDW 14.2 03/08/2015 1855   LYMPHSABS 1.3 06/25/2012 1245   MONOABS 0.9 06/25/2012 1245   EOSABS 0.1 06/25/2012 1245   BASOSABS 0.0 06/25/2012 1245    BMET    Component Value Date/Time   NA 135 03/08/2015 1855   K 4.2 03/08/2015 1855   CL 97 (L) 03/08/2015 1855   CO2 26 03/08/2015 1855   GLUCOSE 113 (H) 03/08/2015 1855   BUN 13 03/08/2015 1855   CREATININE 1.1 04/22/2015 1029   CREATININE 1.40 (H) 03/30/2014 1157   CALCIUM 9.5 03/08/2015 1855   GFRNONAA 42 (L) 03/08/2015 1855   GFRAA 49 (L) 03/08/2015 1855     BNP    Component Value Date/Time   BNP 196.3 (H) 10/24/2014 1335    ProBNP    Component Value Date/Time   PROBNP 431.9 06/25/2012 1420      Assessment & Plan:   OSA (obstructive sleep apnea) Plan: Start using new CPAP mask at home We will decrease pressure settings down to APAP 8-13 4-week compliance check Follow-up with our office in 6 weeks  Shortness of breath Discussion: Wide differential for shortness of breath.  Patient with fluid overload.  No baseline pulmonary function testing.  No recent chest imaging.  Modified Wells scores for PE low.  Modified Wells score for DVT low.  Patient maintained on Xarelto.  Due to patient's age, stable vitals, and low wells score we will hold off on d-dimer at this time.  Plan:  Walk today Lab work today Chest x-ray today We will set up for pulmonary function testing    Return in about 6 weeks (around 10/10/2018), or if symptoms worsen or fail  to improve, for Follow up with Dr. Halford Chessman.   Lauraine Rinne, NP 08/29/2018   This appointment was 28 minutes long with over 50% of the time in direct face-to-face patient care, assessment, plan of care, and follow-up.

## 2018-08-29 NOTE — Addendum Note (Signed)
Addended by: Valerie Salts on: 08/29/2018 12:44 PM   Modules accepted: Orders

## 2018-08-29 NOTE — Patient Instructions (Addendum)
You were seen today by Lauraine Rinne, NP  for:   1. OSA (obstructive sleep apnea)  We will change your CPAP pressures today  Start using your new mask today  We will check your compliance in 4 weeks   We recommend that you continue using your CPAP daily >>>Keep up the hard work using your device >>> Goal should be wearing this for the entire night that you are sleeping, at least 4 to 6 hours  Remember:  . Do not drive or operate heavy machinery if tired or drowsy.  . Please notify the supply company and office if you are unable to use your device regularly due to missing supplies or machine being broken.  . Work on maintaining a healthy weight and following your recommended nutrition plan  . Maintain proper daily exercise and movement  . Maintaining proper use of your device can also help improve management of other chronic illnesses such as: Blood pressure, blood sugars, and weight management.   BiPAP/ CPAP Cleaning:  >>>Clean weekly, with Dawn soap, and bottle brush.  Set up to air dry.   2. Shortness of breath  Walk today   - Pro b natriuretic peptide (BNP); Future - Comp Met (CMET); Future - CBC with Differential/Platelet; Future - DG Chest 2 View; Future - Pulmonary function test; Future   We recommend today:  Orders Placed This Encounter  Procedures  . DG Chest 2 View    Standing Status:   Future    Standing Expiration Date:   10/29/2019    Order Specific Question:   Reason for Exam (SYMPTOM  OR DIAGNOSIS REQUIRED)    Answer:   short of breath    Order Specific Question:   Preferred imaging location?    Answer:   Internal    Order Specific Question:   Radiology Contrast Protocol - do NOT remove file path    Answer:   [email or web address]  . Pro b natriuretic peptide (BNP)    Standing Status:   Future    Standing Expiration Date:   08/29/2019  . Comp Met (CMET)    Standing Status:   Future    Standing Expiration Date:    08/29/2019  . CBC with Differential/Platelet    Standing Status:   Future    Standing Expiration Date:   08/29/2019  . Pulmonary function test    Standing Status:   Future    Standing Expiration Date:   08/29/2019    Order Specific Question:   Where should this test be performed?    Answer:   [facility]    Order Specific Question:   Full PFT: includes the following: basic spirometry, spirometry pre & post bronchodilator, diffusion capacity (DLCO), lung volumes    Answer:   Full PFT   Orders Placed This Encounter  Procedures  . DG Chest 2 View  . Pro b natriuretic peptide (BNP)  . Comp Met (CMET)  . CBC with Differential/Platelet  . Pulmonary function test   No orders of the defined types were placed in this encounter.   Follow Up:    Return in about 6 weeks (around 10/10/2018), or if symptoms worsen or fail to improve, for Follow up with Dr. Halford Chessman.   Please do your part to reduce the spread of COVID-19:      Reduce your risk of any infection  and COVID19 by using the similar precautions used for avoiding the common cold or flu:  Marland Kitchen Wash your hands often with  soap and warm water for at least 20 seconds.  If soap and water are not readily available, use an alcohol-based hand sanitizer with at least 60% alcohol.  . If coughing or sneezing, cover your mouth and nose by coughing or sneezing into the elbow areas of your shirt or coat, into a tissue or into your sleeve (not your hands). Langley Gauss A MASK when in public  . Avoid shaking hands with others and consider head nods or verbal greetings only. . Avoid touching your eyes, nose, or mouth with unwashed hands.  . Avoid close contact with people who are sick. . Avoid places or events with large numbers of people in one location, like concerts or sporting events. . If you have some symptoms but not all symptoms, continue to monitor at home and seek medical attention if your symptoms worsen. . If you are having a medical emergency,  call 911.   [place] / e-Visit: [email or web address]         [facility]: [facility]: [phone]                   [facility]: [phone]     It is flu season:   >>> Best ways to protect herself from the flu: Receive the yearly flu vaccine, practice good hand hygiene washing with soap and also using hand sanitizer when available, eat a nutritious meals, get adequate rest, hydrate appropriately   Please contact the office if your symptoms worsen or you have concerns that you are not improving.   Thank you for choosing [facility] for your healthcare, and for allowing [place] to partner with you on your healthcare journey. I am thankful to be able to provide care to you today.   Wyn Quaker FNP-C

## 2018-08-29 NOTE — Progress Notes (Signed)
Your chest x-ray results of come back.  Showing no acute changes.  No plan of care changes at this time.  Keep follow-up appointment.    Follow-up with our office if symptoms worsen or you do not feel like you are improving under her current regimen.  It was a pleasure taking care of you,  Brian Mack, FNP 

## 2018-08-29 NOTE — Assessment & Plan Note (Addendum)
Discussion: Wide differential for shortness of breath.  Patient with fluid overload.  No baseline pulmonary function testing.  No recent chest imaging.  Modified Wells scores for PE low.  Modified Wells score for DVT low.  Patient maintained on Xarelto.  Due to patient's age, stable vitals, and low wells score we will hold off on d-dimer at this time.  Plan:  Walk today Lab work today Chest x-ray today We will set up for pulmonary function testing

## 2018-08-29 NOTE — Telephone Encounter (Signed)
Patient was seen today by Aaron Edelman. Can we please get him scheduled for a PFT in 6 weeks? Patient already has an appointment to see VS on 10/15 at 1115.   Will route to [facility] for follow up.

## 2018-08-29 NOTE — Assessment & Plan Note (Signed)
Plan: Start using new CPAP mask at home We will decrease pressure settings down to APAP 8-13 4-week compliance check Follow-up with our office in 6 weeks

## 2018-08-30 LAB — PRO B NATRIURETIC PEPTIDE: NT-Pro BNP: 185 pg/mL (ref 0–486)

## 2018-08-31 NOTE — Progress Notes (Signed)
Lab work so far is stable.  Hemoglobin is slightly low.  Can we please have these results routed to patient's primary care.  No further changes at this time.  Lets get make sure patient get scheduled for a breathing test.  Wyn Quaker, FNP

## 2018-09-01 DIAGNOSIS — R238 Other skin changes: Secondary | ICD-10-CM | POA: Diagnosis not present

## 2018-09-01 DIAGNOSIS — D485 Neoplasm of uncertain behavior of skin: Secondary | ICD-10-CM | POA: Diagnosis not present

## 2018-09-01 DIAGNOSIS — L989 Disorder of the skin and subcutaneous tissue, unspecified: Secondary | ICD-10-CM | POA: Diagnosis not present

## 2018-09-03 NOTE — Telephone Encounter (Signed)
Called and scheduled pt at 10am on 10/16/2018-pr

## 2018-09-09 ENCOUNTER — Other Ambulatory Visit: Payer: Self-pay

## 2018-09-09 ENCOUNTER — Ambulatory Visit (INDEPENDENT_AMBULATORY_CARE_PROVIDER_SITE_OTHER): Payer: Medicare Other

## 2018-09-09 DIAGNOSIS — T63441D Toxic effect of venom of bees, accidental (unintentional), subsequent encounter: Secondary | ICD-10-CM | POA: Diagnosis not present

## 2018-09-11 ENCOUNTER — Emergency Department (HOSPITAL_COMMUNITY)
Admission: EM | Admit: 2018-09-11 | Discharge: 2018-09-11 | Disposition: A | Discharge status: Home / Self Care | Payer: Medicare Other | Attending: Emergency Medicine | Admitting: Emergency Medicine

## 2018-09-11 ENCOUNTER — Emergency Department (HOSPITAL_COMMUNITY): Payer: Medicare Other

## 2018-09-11 DIAGNOSIS — I259 Chronic ischemic heart disease, unspecified: Secondary | ICD-10-CM | POA: Diagnosis not present

## 2018-09-11 DIAGNOSIS — R002 Palpitations: Secondary | ICD-10-CM | POA: Diagnosis not present

## 2018-09-11 DIAGNOSIS — I110 Hypertensive heart disease with heart failure: Secondary | ICD-10-CM | POA: Diagnosis not present

## 2018-09-11 DIAGNOSIS — Z951 Presence of aortocoronary bypass graft: Secondary | ICD-10-CM | POA: Insufficient documentation

## 2018-09-11 DIAGNOSIS — I252 Old myocardial infarction: Secondary | ICD-10-CM | POA: Diagnosis not present

## 2018-09-11 DIAGNOSIS — I4891 Unspecified atrial fibrillation: Secondary | ICD-10-CM | POA: Diagnosis not present

## 2018-09-11 DIAGNOSIS — I480 Paroxysmal atrial fibrillation: Secondary | ICD-10-CM

## 2018-09-11 DIAGNOSIS — Z79899 Other long term (current) drug therapy: Secondary | ICD-10-CM | POA: Diagnosis not present

## 2018-09-11 DIAGNOSIS — I5021 Acute systolic (congestive) heart failure: Secondary | ICD-10-CM | POA: Diagnosis not present

## 2018-09-11 DIAGNOSIS — R000 Tachycardia, unspecified: Secondary | ICD-10-CM | POA: Diagnosis not present

## 2018-09-11 DIAGNOSIS — Z7901 Long term (current) use of anticoagulants: Secondary | ICD-10-CM | POA: Diagnosis not present

## 2018-09-11 DIAGNOSIS — Z20828 Contact with and (suspected) exposure to other viral communicable diseases: Secondary | ICD-10-CM | POA: Diagnosis not present

## 2018-09-11 DIAGNOSIS — R0789 Other chest pain: Secondary | ICD-10-CM | POA: Diagnosis not present

## 2018-09-11 DIAGNOSIS — Z87891 Personal history of nicotine dependence: Secondary | ICD-10-CM | POA: Diagnosis not present

## 2018-09-11 LAB — CBC
HCT: 38.3 % — ABNORMAL LOW (ref 39.0–52.0)
Hemoglobin: 12.5 g/dL — ABNORMAL LOW (ref 13.0–17.0)
MCH: 30.9 pg (ref 26.0–34.0)
MCHC: 32.6 g/dL (ref 30.0–36.0)
MCV: 94.6 fL (ref 80.0–100.0)
Platelets: 182 10*3/uL (ref 150–400)
RBC: 4.05 MIL/uL — ABNORMAL LOW (ref 4.22–5.81)
RDW: 14.2 % (ref 11.5–15.5)
WBC: 5.8 10*3/uL (ref 4.0–10.5)
nRBC: 0 % (ref 0.0–0.2)

## 2018-09-11 LAB — URINALYSIS, ROUTINE W REFLEX MICROSCOPIC
Bilirubin Urine: NEGATIVE
Glucose, UA: NEGATIVE mg/dL
Hgb urine dipstick: NEGATIVE
Ketones, ur: NEGATIVE mg/dL
Leukocytes,Ua: NEGATIVE
Nitrite: NEGATIVE
Protein, ur: NEGATIVE mg/dL
Specific Gravity, Urine: 1.008 (ref 1.005–1.030)
pH: 7 (ref 5.0–8.0)

## 2018-09-11 LAB — HEPATIC FUNCTION PANEL
ALT: 18 U/L (ref 0–44)
AST: 27 U/L (ref 15–41)
Albumin: 4 g/dL (ref 3.5–5.0)
Alkaline Phosphatase: 47 U/L (ref 38–126)
Bilirubin, Direct: 0.2 mg/dL (ref 0.0–0.2)
Indirect Bilirubin: 0.5 mg/dL (ref 0.3–0.9)
Total Bilirubin: 0.7 mg/dL (ref 0.3–1.2)
Total Protein: 7.2 g/dL (ref 6.5–8.1)

## 2018-09-11 LAB — BASIC METABOLIC PANEL
Anion gap: 12 (ref 5–15)
BUN: 20 mg/dL (ref 8–23)
CO2: 24 mmol/L (ref 22–32)
Calcium: 9.9 mg/dL (ref 8.9–10.3)
Chloride: 103 mmol/L (ref 98–111)
Creatinine, Ser: 1.39 mg/dL — ABNORMAL HIGH (ref 0.61–1.24)
GFR calc Af Amer: 51 mL/min — ABNORMAL LOW (ref >60)
GFR calc non Af Amer: 44 mL/min — ABNORMAL LOW (ref >60)
Glucose, Bld: 119 mg/dL — ABNORMAL HIGH (ref 70–99)
Potassium: 4.1 mmol/L (ref 3.5–5.1)
Sodium: 139 mmol/L (ref 135–145)

## 2018-09-11 LAB — TROPONIN I (HIGH SENSITIVITY): Troponin I (High Sensitivity): 109 ng/L (ref <18)

## 2018-09-11 LAB — PROTIME-INR
INR: 1.9 — ABNORMAL HIGH (ref 0.8–1.2)
Prothrombin Time: 21.7 seconds — ABNORMAL HIGH (ref 11.4–15.2)

## 2018-09-11 LAB — PHOSPHORUS: Phosphorus: 3.1 mg/dL (ref 2.5–4.6)

## 2018-09-11 LAB — MAGNESIUM: Magnesium: 2.2 mg/dL (ref 1.7–2.4)

## 2018-09-11 LAB — BRAIN NATRIURETIC PEPTIDE: B Natriuretic Peptide: 284.4 pg/mL — ABNORMAL HIGH (ref 0.0–100.0)

## 2018-09-11 LAB — SARS CORONAVIRUS 2 BY RT PCR (HOSPITAL ORDER, PERFORMED IN ~~LOC~~ HOSPITAL LAB): SARS Coronavirus 2: NEGATIVE

## 2018-09-11 LAB — TSH: TSH: 1.797 u[IU]/mL (ref 0.350–4.500)

## 2018-09-11 MED ORDER — SODIUM CHLORIDE 0.9% FLUSH: 3.0000 mL | Freq: Once | INTRAVENOUS | Status: DC

## 2018-09-11 MED ORDER — ETOMIDATE 2 MG/ML IV SOLN
10.0000 mg | Freq: Once | INTRAVENOUS | Status: AC
  Administered 2018-09-11: 10 mg via INTRAVENOUS
  Filled 2018-09-11: qty 10

## 2018-09-11 MED ORDER — MIDAZOLAM HCL 2 MG/2ML IJ SOLN
1.0000 mg | Freq: Once | INTRAMUSCULAR | Status: AC
  Administered 2018-09-11: 1 mg via INTRAVENOUS
  Filled 2018-09-11: qty 2

## 2018-09-11 MED ORDER — METOPROLOL TARTRATE 25 MG PO TABS
25.0000 mg | ORAL_TABLET | Freq: Once | ORAL | Status: AC
  Administered 2018-09-11: 17:00:00 25 mg via ORAL
  Filled 2018-09-11: qty 1

## 2018-09-11 MED ORDER — METOPROLOL SUCCINATE ER 25 MG PO TB24: 12.5000 mg | ORAL_TABLET | Freq: Every day | ORAL | 1 refills | Status: DC

## 2018-09-11 NOTE — ED Triage Notes (Signed)
Patient reports pulse rate up to 150 intermittently over the last couple of days, hx of AFib. He only endorses lightheadedness at times and states he had chest pain last night but thinks it was indigestion. No shortness of breath, dizziness, chest pain at this time. Resp e/u, skin w/d.

## 2018-09-11 NOTE — ED Provider Notes (Signed)
[facility] EMERGENCY DEPARTMENT Provider Note   CSN: [id number] Arrival date & time: 09/11/18  1255     History   Chief Complaint Chief Complaint  Patient presents with  . Atrial Fibrillation    HPI [name] [name] is a 85 y.o. male.     HPI Patient reports he has been noticing some increased fatigue and lightheadedness over the past couple of days.  He reports he has been monitoring his heart rate and occasionally have been up as high as 150s.  He has had some chest discomfort as a pressure but no persistent or radiating pain.  Patient reports he is compliant with his Xarelto.  He takes it as prescribed at all times.  Patient denies he is had fever chills or cough.  He is otherwise felt well. Past Medical History:  Diagnosis Date  . AAA (abdominal aortic aneurysm) ([place])   . Acute coronary syndrome ([place]) 02/29/2012  . Acute systolic heart failure ([place]) 03/03/2012  . Angina   . Ascending aorta dilatation (H[facility]) 03/13/2012   Fusiform dilatation of the ascending thoracic aorta discovered during surgery, max diameter 4.2-4.3 cm  . Atrial fibrillation ([place]) 02/29/2012   Recurrent paroxysmal, new-onset  . BPH (benign prostatic hypertrophy)   . CAD (coronary artery disease)    Cath April 2012 40% left main, 50% LAD, 40% OM, occluded RCA with L-R collaterals   . CHF (congestive heart failure) ([place])   . Chronic venous insufficiency   . Dyspnea   . Glaucoma   . Gout   . Hyperlipidemia   . Hypertension   . Kidney stones   . MI (myocardial infarction) ([place])   . Obesity (BMI 30-39.9)   . Paroxysmal atrial fibrillation (HCC) 02/29/2012   Recurrent paroxysmal, new-onset   . S/P CABG x 3 03/06/2012   LIMA to LAD, SVG to OM, SVG to RCA, EVH via right thigh  . S/P Maze operation for atrial fibrillation 03/06/2012   Complete bilateral lesions set using bipolar radiofrequency and cryothermy ablation with clipping of LA appendage    Patient Active Problem List   Diagnosis Date  Noted  . Shortness of breath 08/29/2018  . Ascending aorta dilatation (HCC) 04/25/2015  . OSA (obstructive sleep apnea) 11/18/2014  . Atypical chest pain 10/24/2014  . Thoracic ascending aortic aneurysm ([place]) 09/29/2012  . S/P laparoscopic cholecystectomy 04/09/2012  . Cholecystitis, acute 03/22/2012  . S/P CABG x 3 03/06/2012  . S/P Maze operation for atrial fibrillation 03/06/2012  . Paroxysmal atrial fibrillation ([place]) 02/29/2012  . CAD (coronary artery disease), native coronary artery   . Hyperlipidemia   . Obesity (BMI 30-39.9)   . Hypertension   . Chronic venous insufficiency   . Gout   . BPH (benign prostatic hypertrophy)     Past Surgical History:  Procedure Laterality Date  . CARDIAC CATHETERIZATION    . CATARACT EXTRACTION, BILATERAL    . CHOLECYSTECTOMY N/A 03/24/2012   Procedure: LAPAROSCOPIC CHOLECYSTECTOMY WITH INTRAOPERATIVE CHOLANGIOGRAM;  Surgeon: Zenovia Jarred, MD;  Location: [facility];  Service: General;  Laterality: N/A;  . CORONARY ARTERY BYPASS GRAFT N/A 03/06/2012   Procedure: CORONARY ARTERY BYPASS GRAFTING (CABG);  Surgeon: Rexene Alberts, MD;  Location: [facility];  Service: Open Heart Surgery;  Laterality: N/A;  . ENDOVEIN HARVEST OF GREATER SAPHENOUS VEIN Right 03/06/2012   Procedure: ENDOVEIN HARVEST OF GREATER SAPHENOUS VEIN;  Surgeon: Rexene Alberts, MD;  Location: [facility];  Service: Open Heart Surgery;  Laterality: Right;  .  INTRAOPERATIVE TRANSESOPHAGEAL ECHOCARDIOGRAM N/A 03/06/2012   Procedure: INTRAOPERATIVE TRANSESOPHAGEAL ECHOCARDIOGRAM;  Surgeon: Rexene Alberts, MD;  Location: [facility];  Service: Open Heart Surgery;  Laterality: N/A;  . LEFT HEART CATH Right 03/02/2012   Procedure: LEFT HEART CATH;  Surgeon: Sherren Mocha, MD;  Location[facility] C[facility] LAB;  Service: Cardiovascular;  Laterality: Right;  Marland Kitchen MAZE N/A 03/06/2012   Procedure: MAZE;  Surgeon: Rexene Alberts, MD;  Location: [facility];  Service: Open Heart Surgery;  Laterality: N/A;  . PROSTATECTOMY           Home Medications    Prior to Admission medications   Medication Sig Start Date End Date Taking? Authorizing Provider  acetaminophen (TYLENOL) 500 MG tablet Take 500 mg by mouth at bedtime.   Yes [provider]  allopurinol (ZYLOPRIM) 300 MG tablet Take 150 mg by mouth Daily. 02/26/11  Yes [provider]  amLODipine (NORVASC) 5 MG tablet TAKE 1 TABLET BY MOUTH DAILY Patient taking differently: Take 5 mg by mouth at bedtime.  02/06/18  Yes Adrian Prows, MD  cholecalciferol (VITAMIN D) 1000 UNITS tablet Take 1,000 Units by mouth daily.   Yes [provider]  DiphenhydrAMINE HCl (BENADRYL ALLERGY PO) Take 25 mg by mouth as needed.    Yes [provider]  EPINEPHrine 0.3 mg/0.3 mL IJ SOAJ injection Inject 0.3 mLs (0.3 mg total) into the muscle as needed for anaphylaxis. 08/08/18  Yes Ambs, Kathrine Cords, FNP  fish oil-omega-3 fatty acids 1000 MG capsule Take 1 g by mouth daily.    Yes [provider]  Fluorouracil (TOLAK) 4 % CREA Apply 1 application topically at bedtime.   Yes [provider]  furosemide (LASIX) 40 MG tablet Take 1 tablet (40 mg total) by mouth daily. 02/27/18  Yes Adrian Prows, MD  galantamine (RAZADYNE ER) 8 MG 24 hr capsule Take 8 mg by mouth Daily. 02/26/11  Yes [provider]  [place] as directed See admin instructions. Every eight weeks   Yes [provider]  isosorbide mononitrate (IMDUR) 60 MG 24 hr tablet TAKE 1 TABLET(60 MG) BY MOUTH DAILY AFTER SUPPER Patient taking differently: Take 60 mg by mouth at bedtime.  06/09/18  Yes Adrian Prows, MD  NITROSTAT 0.4 MG SL tablet Place 1 tablet under the tongue every 5 (five) minutes x 3 doses as needed for chest pain.  04/01/11  Yes [provider]  omega-3 acid ethyl esters (LOVAZA) 1 g capsule Take 1 g by mouth daily.   Yes [provider]  pantoprazole (PROTONIX) 40 MG tablet Take 40 mg by mouth every other day.  10/28/14  Yes [provider]  potassium chloride (MICRO-K) 10 MEQ CR capsule Take 1 capsule (10 mEq total) by mouth daily. With Lasix 03/07/18  Yes Adrian Prows, MD  PRESCRIPTION MEDICATION Cpap   Yes [provider]  psyllium (METAMUCIL) 58.6 % powder Take 1 packet by mouth daily.   Yes [provider]  rivaroxaban (XARELTO) 15 MG TABS tablet Take 1 tablet (15 mg total) by mouth daily with supper. Patient taking differently: Take 20 mg by mouth daily.  03/11/12  Yes Barrett, Erin R, PA-C  rosuvastatin (CRESTOR) 20 MG tablet Take 10 mg by mouth daily.   Yes [provider]  telmisartan (MICARDIS) 40 MG tablet Take 20 mg by mouth daily. 1/2 tab by mouth once daily    Yes [provider]  triamcinolone cream (KENALOG) 0.1 % Apply 1  application topically as needed (dry skin irritation).    Yes [provider]  vitamin B-12 (CYANOCOBALAMIN) 1000 MCG tablet Take 1,000 mcg by mouth daily.    Yes [provider]  ZETIA 10 MG tablet Take 10 mg by mouth Daily. 02/26/11  Yes [provider]  metoprolol succinate (TOPROL-XL) 25 MG 24 hr tablet Take 0.5 tablets (12.5 mg total) by mouth daily. 09/11/18   Charlesetta Shanks, MD    Family History Family History  Problem Relation Age of Onset  . Dementia Mother   . Stroke Brother     Social History Social History   Tobacco Use  . Smoking status: Former Smoker    Packs/day: 0.50    Years: 15.00    Pack years: 7.50    Types: Cigarettes    Quit date: 11/18/1962    Years since quitting: 55.8  . Smokeless tobacco: Never Used  Substance Use Topics  . Alcohol use: Yes    Alcohol/week: 1.0 standard drinks    Types: 1 Shots of liquor per week    Comment: daily  . Drug use: Never     Allergies   Amoxicillin, Bee venom, and Morphine and related   Review of Systems Review of Systems 10 Systems reviewed and are negative for acute change except as noted in the HPI.  Physical Exam Updated Vital Signs BP  126/84   Pulse 76   Temp 98 F (36.7 C) (Oral)   Resp 16   SpO2 99%   Physical Exam Constitutional:      Appearance: Normal appearance.  HENT:     Mouth/Throat:     Mouth: Mucous membranes are moist.     Pharynx: Oropharynx is clear.  Eyes:     Extraocular Movements: Extraocular movements intact.  Cardiovascular:     Rate and Rhythm: Tachycardia present. Rhythm irregular.  Pulmonary:     Effort: Pulmonary effort is normal.     Breath sounds: Normal breath sounds.  Abdominal:     General: There is no distension.     Palpations: Abdomen is soft.     Tenderness: There is no abdominal tenderness. There is no guarding.  Musculoskeletal: Normal range of motion.        General: Swelling present. No tenderness.  Skin:    General: Skin is warm and dry.  Neurological:     General: No focal deficit present.     Mental Status: He is alert and oriented to person, place, and time.     Coordination: Coordination normal.  Psychiatric:        Mood and Affect: Mood normal.      ED Treatments / Results  Labs (all labs ordered are listed, but only abnormal results are displayed) Labs Reviewed  BASIC METABOLIC PANEL - Abnormal; Notable for the following components:      Result Value   Glucose, Bld 119 (*)    Creatinine, Ser 1.39 (*)    GFR calc non Af Amer 44 (*)    GFR calc Af Amer 51 (*)    All other components within normal limits  CBC - Abnormal; Notable for the following components:   RBC 4.05 (*)    Hemoglobin 12.5 (*)    HCT 38.3 (*)    All other components within normal limits  PROTIME-INR - Abnormal; Notable for the following components:   Prothrombin Time 21.7 (*)    INR 1.9 (*)    All other components within normal limits  URINALYSIS, ROUTINE W  REFLEX MICROSCOPIC - Abnormal; Notable for the following components:   Color, Urine STRAW (*)    All other components within normal limits  BRAIN NATRIURETIC PEPTIDE - Abnormal; Notable for the following components:   B  Natriuretic Peptide 284.4 (*)    All other components within normal limits  SARS CORONAVIRUS 2 (HOSPITAL ORDER, PERFORMED IN [facility])  TSH  PHOSPHORUS  MAGNESIUM  HEPATIC FUNCTION PANEL  TROPONIN I (HIGH SENSITIVITY)    EKG EKG Interpretation  Date/Time:  Thursday September 11 2018 13:04:34 EDT Ventricular Rate:  127 PR Interval:    QRS Duration: 128 QT Interval:  318 QTC Calculation: 462 R Axis:   -33 Text Interpretation:  Atrial flutter with variable A-V block with premature ventricular or aberrantly conducted complexes Left axis deviation Left ventricular hypertrophy with QRS widening and repolarization abnormality Abnormal ECG Since last tracing rate faster Confirmed by Wandra Arthurs [id number]) on 09/11/2018 5:02:32 PM   Radiology Dg Chest 2 View  Result Date: 09/11/2018 CLINICAL DATA:  Palpitation. EXAM: CHEST - 2 VIEW COMPARISON:  Radiographs of August 29, 2018. FINDINGS: Stable cardiomediastinal silhouette. Status post coronary bypass graft. Atherosclerosis of thoracic aorta is noted. No pneumothorax or pleural effusion is noted. Both lungs are clear. The visualized skeletal structures are unremarkable. IMPRESSION: No active cardiopulmonary disease. Aortic Atherosclerosis (ICD10-I70.0). Electronically Signed   By: Marijo Conception M.D.   On: 09/11/2018 13:37    Procedures .Sedation  Date/Time: 09/11/2018 4:21 PM Performed by: Charlesetta Shanks, MD Authorized by: Charlesetta Shanks, MD   Consent:    Consent obtained:  Verbal and written   Consent given by:  Patient   Risks discussed:  Allergic reaction, dysrhythmia, inadequate sedation, nausea, vomiting, respiratory compromise necessitating ventilatory assistance and intubation, prolonged sedation necessitating reversal and prolonged hypoxia resulting in organ damage Universal protocol:    Immediately prior to procedure a time out was called: yes   Indications:    Procedure performed:  Cardioversion   Procedure  necessitating sedation performed by:  Different physician Pre-sedation assessment:    Time since last food or drink:  Midnight   ASA classification: class 3 - patient with severe systemic disease     Neck mobility: reduced     Mouth opening:  2 finger widths   Thyromental distance:  3 finger widths   Mallampati score:  II - soft palate, uvula, fauces visible   Pre-sedation assessments completed and reviewed: airway patency, cardiovascular function, hydration status, mental status, nausea/vomiting, pain level, respiratory function and temperature   Immediate pre-procedure details:    Reassessment: Patient reassessed immediately prior to procedure     Reviewed: vital signs and relevant labs/tests     Verified: bag valve mask available, emergency equipment available, intubation equipment available, IV patency confirmed, oxygen available and suction available   Procedure details (see MAR for exact dosages):    Preoxygenation:  Nasal cannula   Sedation:  Etomidate   Intra-procedure monitoring:  Blood pressure monitoring, cardiac monitor, continuous pulse oximetry, continuous capnometry, frequent LOC assessments and frequent vital sign checks   Intra-procedure events: none     Intra-procedure management:  Airway repositioning   Total Provider sedation time (minutes):  20 Post-procedure details:    Post-sedation assessment completed:  09/11/2018 4:22 PM   Post-sedation assessments completed and reviewed: airway patency, cardiovascular function, hydration status, mental status, nausea/vomiting, pain level and respiratory function     Patient is stable for discharge or admission: yes     Patient tolerance:  Tolerated well, no immediate complications   (including critical care time)  Medications Ordered in ED Medications  sodium chloride flush (NS) 0.9 % injection 3 mL (has no administration in time range)  metoprolol tartrate (LOPRESSOR) tablet 25 mg (has no administration in time range)   midazolam (VERSED) injection 1 mg (1 mg Intravenous Given 09/11/18 1544)  etomidate (AMIDATE) injection 10 mg (10 mg Intravenous Given 09/11/18 1545)     Initial Impression / Assessment and Plan / ED Course  I have reviewed the triage vital signs and the nursing notes.  Pertinent labs & imaging results that were available during my care of the patient were reviewed by me and considered in my medical decision making (see chart for details).       Consult: Dr. Einar Gip came to the emergency department to do cardioversion.  See separate note.  Patient has history of atrial fibrillation.  He became symptomatic over the past several days.  He is anticoagulated chronically on Xarelto.  Dr. Einar Gip felt that cardioversion was best management this was done in the emergency department without complication.  Patient does not have other complaints.  No indication at this time of infectious illness or other metabolic derangement.  Patient will be observed after cardioversion and plan for discharge home.   Final Clinical Impressions(s) / ED Diagnoses   Final diagnoses:  Atrial fibrillation with [facility])    ED Discharge Orders         Ordered    metoprolol succinate (TOPROL-XL) 25 MG 24 hr tablet  Daily     09/11/18 1625           Charlesetta Shanks, MD 09/16/18 1810

## 2018-09-11 NOTE — CV Procedure (Signed)
Direct current cardioversion:  Indication symptomatic A. Fibrillation.  Procedure: Using 10 mg of IV Etomidate  and 1.0 mg ativan  IV for achieving deep sedation, synchronized direct current cardioversion performed. Patient was delivered with 100 Joules of electricity X 1 with success to NSR. Patient tolerated the procedure well. No immediate complication noted.    He will be discharged home on Metoprolol Succinate 25 mg daily. Wife present and explained the process. I will see him in the office in the next 1-2 weeks.  Adrian Prows, MD, [facility] 09/11/2018, 4:01 PM [facility] [place] Pager: [phone] Office: [phone] If no answer Cell [phone]

## 2018-09-11 NOTE — Discharge Instructions (Addendum)
1.  Schedule follow-up appoint with Dr. Einar Gip as planned 2.  You have been given a dose for Toprol XL.  Monitor your heart rate at home.  Start with a half a tablet daily.  You may need to increase to a full tablet if your blood pressure is not getting low.  Call Dr. Einar Gip for guidance on continuing your blood pressure medications and starting Toprol.  Your goal heart rate will be between 60 to 80 bpm.

## 2018-09-11 NOTE — ED Notes (Signed)
1549 Time out performed Dr. Einar Gip, Dr. Johnney Killian, and this RN are present 1550 100J shock done 1551 bp-114/98, p-81, ETCO2 22, Sa02 99% 1555 bp-137/79, p-75, ETCO2 23, Sa02 100% Pt still in A-fib, but rate is lower

## 2018-09-11 NOTE — ED Provider Notes (Signed)
  Physical Exam  BP 126/84   Pulse 76   Temp 98 F (36.7 C) (Oral)   Resp 16   SpO2 99%   Physical Exam  ED Course/Procedures     Procedures  MDM  Patient care assumed at 4 pm. Patient has hx of afib and is anticoagulated. Dr. Johnney Killian performed cardioversion and sign out to reassess patient   5:11 PM HR is 76, remained in sinus rhythm. Trop is 100 likely from demand ischemia from rapid afib. Labs otherwise unremarkable. Has follow up with Dr. Nadyne Coombes. Stable for discharge    Drenda Freeze, MD 09/11/18 1712

## 2018-09-11 NOTE — H&P (Signed)
CARDIOLOGY ADMIT NOTE   [name] [name] is an 85 y.o. male.    Chief Complaint  Patient presents with  . Atrial Fibrillation     HPI: [name] [name]  is a 85 y.o. Caucasian male  with vitamin B12 deficiency, mild dementia, hyperlipidemia, hypertension, CAD, chronic stable angina status post CABG x 3 and Maze and LAA amputation in 2014, h/o atrial fibrillation (no documentation in Epic), obstructive sleep apnea not currently on CPAP, mild aortic stenosis by echo in 2018 with thoracic aortic aneurysm and dilation of ascending aorta and follows Dr. Lilly Cove (No correlation with TTE with regards to aneurysm), venous stasis edema.  He was doing well until last night he had heartburn after he ate his dinner.  But this morning he woke up not feeling well and felt tired, also felt heart racing, tried to take his blood pressure and also pulse and pulse oximeter, his heart rate was jumping all the way from 110 bpm to 150 bpm.  He called our office with the above complaints and was made to come to the emergency room.  In the ED he was found to be in A. fib with RVR.  States that otherwise he feels well, denies chest pain or shortness of breath.  Past Medical History:  Diagnosis Date  . AAA (abdominal aortic aneurysm) ([place])   . Acute coronary syndrome ([place]) 02/29/2012  . Acute systolic heart failure ([place]) 03/03/2012  . Angina   . Ascending aorta dilatation ([facility]) 03/13/2012   Fusiform dilatation of the ascending thoracic aorta discovered during surgery, max diameter 4.2-4.3 cm  . Atrial fibrillation ([place]) 02/29/2012   Recurrent paroxysmal, new-onset  . BPH (benign prostatic hypertrophy)   . CAD (coronary artery disease)    Cath April 2012 40% left main, 50% LAD, 40% OM, occluded RCA with L-R collaterals   . CHF (congestive heart failure) ([place])   . Chronic venous insufficiency   . Dyspnea   . Glaucoma   . Gout   . Hyperlipidemia   . Hypertension   . Kidney stones   . MI (myocardial infarction) ([place])    . Obesity (BMI 30-39.9)   . Paroxysmal atrial fibrillation ([facility]) 02/29/2012   Recurrent paroxysmal, new-onset   . S/P CABG x 3 03/06/2012   LIMA to LAD, SVG to OM, SVG to RCA, EVH via right thigh  . S/P Maze operation for atrial fibrillation 03/06/2012   Complete bilateral lesions set using bipolar radiofrequency and cryothermy ablation with clipping of LA appendage    Past Surgical History:  Procedure Laterality Date  . CARDIAC CATHETERIZATION    . CATARACT EXTRACTION, BILATERAL    . CHOLECYSTECTOMY N/A 03/24/2012   Procedure: LAPAROSCOPIC CHOLECYSTECTOMY WITH INTRAOPERATIVE CHOLANGIOGRAM;  Surgeon: Zenovia Jarred, MD;  Location: [facility];  Service: General;  Laterality: N/A;  . CORONARY ARTERY BYPASS GRAFT N/A 03/06/2012   Procedure: CORONARY ARTERY BYPASS GRAFTING (CABG);  Surgeon: Rexene Alberts, MD;  Location: [facility];  Service: Open Heart Surgery;  Laterality: N/A;  . ENDOVEIN HARVEST OF GREATER SAPHENOUS VEIN Right 03/06/2012   Procedure: ENDOVEIN HARVEST OF GREATER SAPHENOUS VEIN;  Surgeon: Rexene Alberts, MD;  Location: [facility];  Service: Open Heart Surgery;  Laterality: Right;  . INTRAOPERATIVE TRANSESOPHAGEAL ECHOCARDIOGRAM N/A 03/06/2012   Procedure: INTRAOPERATIVE TRANSESOPHAGEAL ECHOCARDIOGRAM;  Surgeon: Rexene Alberts, MD;  Location: [facility];  Service: Open Heart Surgery;  Laterality: N/A;  . LEFT HEART CATH Right 03/02/2012   Procedure: LEFT HEART CATH;  Surgeon:  Sherren Mocha, MD;  Location: [facility] CATH LAB;  Service: Cardiovascular;  Laterality: Right;  Marland Kitchen MAZE N/A 03/06/2012   Procedure: MAZE;  Surgeon: Rexene Alberts, MD;  Location: [facility];  Service: Open Heart Surgery;  Laterality: N/A;  . PROSTATECTOMY      Social History   Socioeconomic History  . Marital status: Married    Spouse name: Not on file  . Number of children: 4  . Years of education: Not on file  . Highest education level: Not on file  Occupational History  . 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 Financial resource strain: Not on file  . Food insecurity    Worry: Not on file    Inability: Not on file  . Transportation needs    Medical: Not on file    Non-medical: Not on file  Tobacco Use  . Smoking status: Former Smoker    Packs/day: 0.50    Years: 15.00    Pack years: 7.50    Types: Cigarettes    Quit date: 11/18/1962    Years since quitting: 55.8  . Smokeless tobacco: Never Used  Substance and Sexual Activity  . Alcohol use: Yes    Alcohol/week: 1.0 standard drinks    Types: 1 Shots of liquor per week    Comment: daily  . Drug use: Never  . Sexual activity: Yes  Lifestyle  . Physical activity    Days per week: Not on file    Minutes per session: Not on file  . Stress: Not on file  Relationships  . Social Herbalist on phone: Not on file    Gets together: Not on file    Attends religious service: Not on file    Active member of club or organization: Not on file    Attends meetings of clubs or organizations: Not on file    Relationship status: Not on file  . Intimate partner violence    Fear of current or ex partner: Not on file    Emotionally abused: Not on file    Physically abused: Not on file    Forced sexual activity: Not on file  Other Topics Concern  . Not on file  Social History Narrative   Lives with wife in [place].     Very active physically without significant limitations.    Exercises regularly    No current facility-administered medications on file prior to encounter.    Current Outpatient Medications on File Prior to Encounter  Medication Sig Dispense Refill  . acetaminophen (TYLENOL) 500 MG tablet Take 500 mg by mouth at bedtime.    Marland Kitchen allopurinol (ZYLOPRIM) 300 MG tablet Take 150 mg by mouth Daily.    Marland Kitchen amLODipine (NORVASC) 5 MG tablet TAKE 1 TABLET BY MOUTH DAILY (Patient taking differently: Take 5 mg by mouth at bedtime. ) 30 tablet 1  . cholecalciferol (VITAMIN D) 1000 UNITS tablet Take 1,000 Units by mouth  daily.    . DiphenhydrAMINE HCl (BENADRYL ALLERGY PO) Take 25 mg by mouth as needed.     Marland Kitchen EPINEPHrine 0.3 mg/0.3 mL IJ SOAJ injection Inject 0.3 mLs (0.3 mg total) into the muscle as needed for anaphylaxis. 2 each 2  . fish oil-omega-3 fatty acids 1000 MG capsule Take 1 g by mouth daily.     . Fluorouracil (TOLAK) 4 % CREA Apply 1 application topically at bedtime.    . furosemide (LASIX) 40 MG tablet Take 1  tablet (40 mg total) by mouth daily. 90 tablet 2  . galantamine (RAZADYNE ER) 8 MG 24 hr capsule Take 8 mg by mouth Daily.    . HONEY BEE VENOM IJ Inject as directed See admin instructions. Every eight weeks    . isosorbide mononitrate (IMDUR) 60 MG 24 hr tablet TAKE 1 TABLET(60 MG) BY MOUTH DAILY AFTER SUPPER (Patient taking differently: Take 60 mg by mouth at bedtime. ) 30 tablet 2  . NITROSTAT 0.4 MG SL tablet Place 1 tablet under the tongue every 5 (five) minutes x 3 doses as needed for chest pain.     Marland Kitchen omega-3 acid ethyl esters (LOVAZA) 1 g capsule Take 1 g by mouth daily.    . pantoprazole (PROTONIX) 40 MG tablet Take 40 mg by mouth every other day.   4  . potassium chloride (MICRO-K) 10 MEQ CR capsule Take 1 capsule (10 mEq total) by mouth daily. With Lasix 90 capsule 1  . PRESCRIPTION MEDICATION Cpap    . psyllium (METAMUCIL) 58.6 % powder Take 1 packet by mouth daily.    . rivaroxaban (XARELTO) 15 MG TABS tablet Take 1 tablet (15 mg total) by mouth daily with supper. (Patient taking differently: Take 20 mg by mouth daily. ) 30 tablet 1  . rosuvastatin (CRESTOR) 20 MG tablet Take 10 mg by mouth daily.    Marland Kitchen telmisartan (MICARDIS) 40 MG tablet Take 20 mg by mouth daily. 1/2 tab by mouth once daily     . triamcinolone cream (KENALOG) 0.1 % Apply 1 application topically as needed (dry skin irritation).     . vitamin B-12 (CYANOCOBALAMIN) 1000 MCG tablet Take 1,000 mcg by mouth daily.     Marland Kitchen ZETIA 10 MG tablet Take 10 mg by mouth Daily.      No intake/output data recorded.  No  intake/output data recorded.   Review of Systems  Constitution: Positive for malaise/fatigue. Negative for chills, decreased appetite and weight gain.  Cardiovascular: Positive for leg swelling and palpitations. Negative for dyspnea on exertion and syncope.  Endocrine: Negative for cold intolerance.  Hematologic/Lymphatic: Does not bruise/bleed easily.  Musculoskeletal: Positive for back pain, joint pain and neck pain. Negative for joint swelling.  Gastrointestinal: Negative for abdominal pain, anorexia, change in bowel habit, hematochezia and melena.  Neurological: Positive for dizziness. Negative for headaches and light-headedness.  Psychiatric/Behavioral: Negative for depression and substance abuse.  All other systems reviewed and are negative.       Objective:  Blood pressure 107/88, pulse (!) 113, temperature 98 F (36.7 C), temperature source Oral, resp. rate 18, SpO2 100 %. There is no height or weight on file to calculate BMI. Vitals with BMI 09/11/2018 09/11/2018 09/11/2018  Height - - -  Weight - - -  BMI - - -  Systolic 983 382 505  Diastolic 88 79 74  Pulse 397 32 67    Blood pressure 107/88, pulse (!) 113, temperature 98 F (36.7 C), temperature source Oral, resp. rate 18, SpO2 100 %. There is no height or weight on file to calculate BMI.   Physical Exam  Constitutional: He appears well-developed and well-nourished. No distress.  HENT:  Head: Atraumatic.  Eyes: Conjunctivae are normal.  Neck: Neck supple. No JVD present. No thyromegaly present.  Cardiovascular: An irregularly irregular rhythm present. Tachycardia present. Exam reveals no gallop.  Murmur heard.  Scratchy midsystolic murmur is present with a grade of 3/6 at the upper right sternal border. Pulses:  Carotid pulses are 2+ on the right side and 2+ on the left side.      Radial pulses are 2+ on the right side and 2+ on the left side.       Femoral pulses are 2+ on the right side and 2+ on the left  side.      Popliteal pulses are 2+ on the right side and 2+ on the left side.       Dorsalis pedis pulses are 0 on the right side and 1+ on the left side.       Posterior tibial pulses are 0 on the right side and 0 on the left side.  Vascular examination reveals bilateral pigmented lower extremities with varicose veins.  2+ pitting edema.  Pulmonary/Chest: Effort normal and breath sounds normal.  Abdominal: Soft. Bowel sounds are normal.  Musculoskeletal: Normal range of motion.        General: No edema.  Neurological: He is alert.  Skin: Skin is warm and dry.  Psychiatric: He has a normal mood and affect.   Radiology: Dg Chest 2 View  Result Date: 09/11/2018 CLINICAL DATA:  Palpitation. EXAM: CHEST - 2 VIEW COMPARISON:  Radiographs of August 29, 2018. FINDINGS: Stable cardiomediastinal silhouette. Status post coronary bypass graft. Atherosclerosis of thoracic aorta is noted. No pneumothorax or pleural effusion is noted. Both lungs are clear. The visualized skeletal structures are unremarkable. IMPRESSION: No active cardiopulmonary disease. Aortic Atherosclerosis (ICD10-I70.0). Electronically Signed   By: Marijo Conception M.D.   On: 09/11/2018 13:37    Laboratory examination:   Recent Labs    08/29/18 1218 09/11/18 1330  NA 140 139  K 4.0 4.1  CL 102 103  CO2 30 24  GLUCOSE 101* 119*  BUN 23 20  CREATININE 1.34 1.39*  CALCIUM 10.0 9.9  GFRNONAA  --  44*  GFRAA  --  51*   CMP Latest Ref Rng & Units 09/11/2018 08/29/2018 04/22/2015  Glucose 70 - 99 mg/dL 119(H) 101(H) -  BUN 8 - 23 mg/dL 20 23 -  Creatinine 0.61 - 1.24 mg/dL 1.39(H) 1.34 1.1  Sodium 135 - 145 mmol/L 139 140 -  Potassium 3.5 - 5.1 mmol/L 4.1 4.0 -  Chloride 98 - 111 mmol/L 103 102 -  CO2 22 - 32 mmol/L 24 30 -  Calcium 8.9 - 10.3 mg/dL 9.9 10.0 -  Total Protein 6.5 - 8.1 g/dL 7.2 6.9 -  Total Bilirubin 0.3 - 1.2 mg/dL 0.7 0.6 -  Alkaline Phos 38 - 126 U/L 47 43 -  AST 15 - 41 U/L 27 18 -  ALT 0 - 44 U/L 18  13 -   CBC Latest Ref Rng & Units 09/11/2018 08/29/2018 03/08/2015  WBC 4.0 - 10.5 K/uL 5.8 4.0 6.2  Hemoglobin 13.0 - 17.0 g/dL 12.5(L) 11.6(L) 12.6(L)  Hematocrit 39.0 - 52.0 % 38.3(L) 34.2(L) 37.2(L)  Platelets 150 - 400 K/uL 182 184.0 167   Lipid Panel     Component Value Date/Time   CHOL 84 03/03/2012 0751   TRIG 75 03/03/2012 0751   HDL 40 03/03/2012 0751   CHOLHDL 2.1 03/03/2012 0751   VLDL 15 03/03/2012 0751   LDLCALC 29 03/03/2012 0751   HEMOGLOBIN A1C Lab Results  Component Value Date   HGBA1C 6.1 (H) 03/03/2012   MPG 128 (H) 03/03/2012   TSH Recent Labs    09/11/18 1420  TSH 1.797   Medications   Prior to Admission medications   Medication Sig Start Date  End Date Taking? Authorizing Provider  acetaminophen (TYLENOL) 500 MG tablet Take 500 mg by mouth at bedtime.   Yes [provider]  allopurinol (ZYLOPRIM) 300 MG tablet Take 150 mg by mouth Daily. 02/26/11  Yes [provider]  amLODipine (NORVASC) 5 MG tablet TAKE 1 TABLET BY MOUTH DAILY Patient taking differently: Take 5 mg by mouth at bedtime.  02/06/18  Yes Adrian Prows, MD  cholecalciferol (VITAMIN D) 1000 UNITS tablet Take 1,000 Units by mouth daily.   Yes [provider]  DiphenhydrAMINE HCl (BENADRYL ALLERGY PO) Take 25 mg by mouth as needed.    Yes [provider]  EPINEPHrine 0.3 mg/0.3 mL IJ SOAJ injection Inject 0.3 mLs (0.3 mg total) into the muscle as needed for anaphylaxis. 08/08/18  Yes Ambs, Kathrine Cords, FNP  fish oil-omega-3 fatty acids 1000 MG capsule Take 1 g by mouth daily.    Yes [provider]  Fluorouracil (TOLAK) 4 % CREA Apply 1 application topically at bedtime.   Yes [provider]  furosemide (LASIX) 40 MG tablet Take 1 tablet (40 mg total) by mouth daily. 02/27/18  Yes Adrian Prows, MD  galantamine (RAZADYNE ER) 8 MG 24 hr capsule Take 8 mg by mouth Daily. 02/26/11  Yes [provider]  [place] as directed See admin  instructions. Every eight weeks   Yes [provider]  isosorbide mononitrate (IMDUR) 60 MG 24 hr tablet TAKE 1 TABLET(60 MG) BY MOUTH DAILY AFTER SUPPER Patient taking differently: Take 60 mg by mouth at bedtime.  06/09/18  Yes Adrian Prows, MD  NITROSTAT 0.4 MG SL tablet Place 1 tablet under the tongue every 5 (five) minutes x 3 doses as needed for chest pain.  04/01/11  Yes [provider]  omega-3 acid ethyl esters (LOVAZA) 1 g capsule Take 1 g by mouth daily.   Yes [provider]  pantoprazole (PROTONIX) 40 MG tablet Take 40 mg by mouth every other day.  10/28/14  Yes [provider]  potassium chloride (MICRO-K) 10 MEQ CR capsule Take 1 capsule (10 mEq total) by mouth daily. With Lasix 03/07/18  Yes Adrian Prows, MD  PRESCRIPTION MEDICATION Cpap   Yes [provider]  psyllium (METAMUCIL) 58.6 % powder Take 1 packet by mouth daily.   Yes [provider]  rivaroxaban (XARELTO) 15 MG TABS tablet Take 1 tablet (15 mg total) by mouth daily with supper. Patient taking differently: Take 20 mg by mouth daily.  03/11/12  Yes Barrett, Erin R, PA-C  rosuvastatin (CRESTOR) 20 MG tablet Take 10 mg by mouth daily.   Yes [provider]  telmisartan (MICARDIS) 40 MG tablet Take 20 mg by mouth daily. 1/2 tab by mouth once daily    Yes [provider]  triamcinolone cream (KENALOG) 0.1 % Apply 1 application topically as needed (dry skin irritation).    Yes [provider]  vitamin B-12 (CYANOCOBALAMIN) 1000 MCG tablet Take 1,000 mcg by mouth daily.    Yes [provider]  ZETIA 10 MG tablet Take 10 mg by mouth Daily. 02/26/11  Yes [provider]     Current Outpatient Medications  Medication Instructions  . acetaminophen (TYLENOL) 500 mg, Oral, Daily at bedtime  . allopurinol (ZYLOPRIM) 150 mg, Daily  . amLODipine (NORVASC) 5 MG tablet TAKE 1 TABLET BY MOUTH DAILY  . cholecalciferol (VITAMIN D) 1,000 Units, Daily   . DiphenhydrAMINE HCl (BENADRYL ALLERGY PO) 25 mg, As needed  .  EPINEPHrine (EPI-PEN) 0.3 mg, Intramuscular, As needed  . fish oil-omega-3 fatty acids 1 g, Daily  . Fluorouracil (TOLAK) 4 % CREA 1 application, Topical, Daily at bedtime  . furosemide (LASIX) 40 mg, Oral, Daily  . galantamine (RAZADYNE ER) 8 mg, Daily  . HONEY BEE VENOM IJ Injection, See admin instructions, Every eight weeks  . isosorbide mononitrate (IMDUR) 60 MG 24 hr tablet TAKE 1 TABLET(60 MG) BY MOUTH DAILY AFTER SUPPER  . NITROSTAT 0.4 MG SL tablet 1 tablet, Every 5 min x3 PRN  . omega-3 acid ethyl esters (LOVAZA) 1 g, Oral, Daily  . pantoprazole (PROTONIX) 40 mg, Oral, Every other day  . potassium chloride (MICRO-K) 10 MEQ CR capsule 10 mEq, Oral, Daily, With Lasix  . PRESCRIPTION MEDICATION Cpap   . psyllium (METAMUCIL) 58.6 % powder 1 packet, Oral, Daily  . Rivaroxaban (XARELTO) 15 mg, Oral, Daily with supper  . rosuvastatin (CRESTOR) 10 mg, Daily  . telmisartan (MICARDIS) 20 mg, Oral, Daily, 1/2 tab by mouth once daily  . triamcinolone cream (KENALOG) 0.1 % 1 application, As needed  . vitamin B-12 (CYANOCOBALAMIN) 1,000 mcg, Daily  . Zetia 10 mg, Daily    Cardiac Studies:   EKG 09/11/2018: Atrial fibrillation with rapid ventricular response at the rate of 120 bpm, left axis deviation, right bundle branch block.  PVCs.  No evidence of ischemia.  Assessment   1.  Atrial fibrillation with rapid ventricular response, paroxysmal atrial fibrillation. CHA2DS2-VASc Score is 4.  Yearly risk of stroke: 4%.  2.  CAD S/P CABG with LIMA to LAD, SVG to OM, SVG to RCA and Maze procedure and left atrial appendage ligation on 03/24/2012. 3.  Essential hypertension 4.  Thoracic aneurysm being followed by Dr. Braulio Conte over.  Recommendations:  Recommendation: Patient is on anticoagulation appropriately, he is symptomatic, will proceed with direct-current cardioversion.  If he remains well, he can be discharged home.  Direct  current cardioversion:  Indication symptomatic A. Fibrillation.  Procedure: Using 10 mg of IV Etomidate  and 1.0 mg ativan  IV for achieving deep sedation, synchronized direct current cardioversion performed. Patient was delivered with 100 Joules of electricity X 1 with success to NSR. Patient tolerated the procedure well. No immediate complication noted.    He will be discharged home on Metoprolol Succinate 25 mg daily. Wife present and explained the process. I will see him in the office in the next 1-2 weeks.  Adrian Prows, MD, [facility] 09/11/2018, 3:58 PM [facility] [place] Pager: [phone] Office: [phone] If no answer Cell [phone]

## 2018-09-12 ENCOUNTER — Other Ambulatory Visit: Payer: Self-pay

## 2018-09-12 ENCOUNTER — Ambulatory Visit: Payer: Medicare Other | Admitting: Cardiology

## 2018-09-12 ENCOUNTER — Encounter: Payer: Self-pay | Admitting: Cardiology

## 2018-09-12 VITALS — BP 134/70 | HR 42 | Ht 73.0 in | Wt 247.8 lb

## 2018-09-12 DIAGNOSIS — I10 Essential (primary) hypertension: Secondary | ICD-10-CM

## 2018-09-12 DIAGNOSIS — I25118 Atherosclerotic heart disease of native coronary artery with other forms of angina pectoris: Secondary | ICD-10-CM | POA: Diagnosis not present

## 2018-09-12 DIAGNOSIS — I480 Paroxysmal atrial fibrillation: Secondary | ICD-10-CM | POA: Diagnosis not present

## 2018-09-12 NOTE — Progress Notes (Signed)
Primary Physician/Referring:  [name], MD  Patient ID: [name] [name], male    DOB: 31-Mar-1926, 85 y.o.   MRN: [id number]  Chief Complaint  Patient presents with  . Atrial Fibrillation  . Follow-up   HPI:    HPI: [name] [name]  is a 84 y.o. Caucasian male with vitamin B12 deficiency, mild dementia, hyperlipidemia, hypertension, CAD, chronic stable angina status post CABG x 3 and Maze and LAA amputation in 2014, h/o atrial fibrillation, obstructive sleep apnea not currently on CPAP, mild aortic stenosis by echo in 2018 with thoracic aortic aneurysm and dilation of ascending aorta and follows Dr. Lilly Cove (No correlation with TTE with regards to aneurysm), venous stasis edema.   Patient seen in the emergency room yesterday when he woke up with dyspnea, fatigue and palpitations and was found to be in atrial fibrillation with rapid ventricular response, underwent successful cardioversion and discharged home with addition of metoprolol.  He called [place] this morning stating that he is having difficulty obtaining his pulse and he seems to be regular hence brought in for urgent evaluation.   Past Medical History:  Diagnosis Date  . AAA (abdominal aortic aneurysm) ([place])   . Acute coronary syndrome ([place]) 02/29/2012  . Acute systolic heart failure ([place]) 03/03/2012  . Angina   . Ascending aorta dilatation (H[facility]) 03/13/2012   Fusiform dilatation of the ascending thoracic aorta discovered during surgery, max diameter 4.2-4.3 cm  . Atrial fibrillation ([place]) 02/29/2012   Recurrent paroxysmal, new-onset  . BPH (benign prostatic hypertrophy)   . CAD (coronary artery disease)    Cath April 2012 40% left main, 50% LAD, 40% OM, occluded RCA with L-R collaterals   . CHF (congestive heart failure) ([place])   . Chronic venous insufficiency   . Dyspnea   . Glaucoma   . Gout   . Hyperlipidemia   . Hypertension   . Kidney stones   . MI (myocardial infarction) ([place])   . Obesity (BMI 30-39.9)   . Paroxysmal  atrial fibrillation (HCC) 02/29/2012   Recurrent paroxysmal, new-onset   . S/P CABG x 3 03/06/2012   LIMA to LAD, SVG to OM, SVG to RCA, EVH via right thigh  . S/P Maze operation for atrial fibrillation 03/06/2012   Complete bilateral lesions set using bipolar radiofrequency and cryothermy ablation with clipping of LA appendage   Past Surgical History:  Procedure Laterality Date  . CARDIAC CATHETERIZATION    . CATARACT EXTRACTION, BILATERAL    . CHOLECYSTECTOMY N/A 03/24/2012   Procedure: LAPAROSCOPIC CHOLECYSTECTOMY WITH INTRAOPERATIVE CHOLANGIOGRAM;  Surgeon: Zenovia Jarred, MD;  Location: [facility];  Service: General;  Laterality: N/A;  . CORONARY ARTERY BYPASS GRAFT N/A 03/06/2012   Procedure: CORONARY ARTERY BYPASS GRAFTING (CABG);  Surgeon: Rexene Alberts, MD;  Location: [facility];  Service: Open Heart Surgery;  Laterality: N/A;  . ENDOVEIN HARVEST OF GREATER SAPHENOUS VEIN Right 03/06/2012   Procedure: ENDOVEIN HARVEST OF GREATER SAPHENOUS VEIN;  Surgeon: Rexene Alberts, MD;  Location: [facility];  Service: Open Heart Surgery;  Laterality: Right;  . INTRAOPERATIVE TRANSESOPHAGEAL ECHOCARDIOGRAM N/A 03/06/2012   Procedure: INTRAOPERATIVE TRANSESOPHAGEAL ECHOCARDIOGRAM;  Surgeon: Rexene Alberts, MD;  Location: [facility];  Service: Open Heart Surgery;  Laterality: N/A;  . LEFT HEART CATH Right 03/02/2012   Procedure: LEFT HEART CATH;  Surgeon: Sherren Mocha, MD;  Location: [facility] C[facility];  Service: Cardiovascular;  Laterality: Right;  Marland Kitchen MAZE N/A 03/06/2012   Procedure: MAZE;  Surgeon: Rexene Alberts,  MD;  Location: [facility] OR;  Service: Open Heart Surgery;  Laterality: N/A;  . PROSTATECTOMY     Social History   Socioeconomic History  . Marital status: Married    Spouse name: Not on file  . Number of children: 4  . Years of education: Not on file  . Highest education level: Not on file  Occupational History  . 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 Financial resource strain: Not on  file  . Food insecurity    Worry: Not on file    Inability: Not on file  . Transportation needs    Medical: Not on file    Non-medical: Not on file  Tobacco Use  . Smoking status: Former Smoker    Packs/day: 0.50    Years: 15.00    Pack years: 7.50    Types: Cigarettes    Quit date: 11/18/1962    Years since quitting: 55.8  . Smokeless tobacco: Never Used  Substance and Sexual Activity  . Alcohol use: Yes    Alcohol/week: 1.0 standard drinks    Types: 1 Shots of liquor per week    Comment: daily  . Drug use: Never  . Sexual activity: Yes  Lifestyle  . Physical activity    Days per week: Not on file    Minutes per session: Not on file  . Stress: Not on file  Relationships  . Social Herbalist on phone: Not on file    Gets together: Not on file    Attends religious service: Not on file    Active member of club or organization: Not on file    Attends meetings of clubs or organizations: Not on file    Relationship status: Not on file  . Intimate partner violence    Fear of current or ex partner: Not on file    Emotionally abused: Not on file    Physically abused: Not on file    Forced sexual activity: Not on file  Other Topics Concern  . Not on file  Social History Narrative   Lives with wife in [place].     Very active physically without significant limitations.    Exercises regularly   ROS  Review of Systems  Constitution: Positive for malaise/fatigue. Negative for chills, decreased appetite and weight gain.  Cardiovascular: Positive for leg swelling and palpitations. Negative for dyspnea on exertion and syncope.  Endocrine: Negative for cold intolerance.  Hematologic/Lymphatic: Does not bruise/bleed easily.  Musculoskeletal: Positive for back pain, joint pain and neck pain. Negative for joint swelling.  Gastrointestinal: Negative for abdominal pain, anorexia, change in bowel habit, hematochezia and melena.  Neurological: Positive for dizziness.  Negative for headaches and light-headedness.  Psychiatric/Behavioral: Negative for depression and substance abuse.  All other systems reviewed and are negative.  Objective  Blood pressure 134/70, pulse (!) 42, height '6\' 1"'  (1.854 m), weight 247 lb 12.8 oz (112.4 kg), SpO2 98 %. Body mass index is 32.69 kg/m.    Flowsheets     08/11/18 11:35 AM  08/11/18 12:31 PM  08/11/18 12:42 PM   BP  128/66  133/66  123/67   BP Location  LeftArm  RightArm  LeftArm   Patient Position  Sitting  Sitting  Sitting   Cuff Size  Large  Normal  Normal   Pulse  75  74  61   SpO2  98%  95%  96%   Weight  246lb8oz(111.8kg)  Height  6'1"(1.877m        Physical Exam  Constitutional: He appears well-developed and well-nourished. No distress.  HENT:  Head: Atraumatic.  Eyes: Conjunctivae are normal.  Neck: Neck supple. No JVD present. No thyromegaly present.  Cardiovascular: Normal rate and regular rhythm. Exam reveals no gallop.  Murmur heard.  Scratchy midsystolic murmur is present with a grade of 3/6 at the upper right sternal border. Pulses:      Carotid pulses are 2+ on the right side and 2+ on the left side.      Radial pulses are 2+ on the right side and 2+ on the left side.       Femoral pulses are 2+ on the right side and 2+ on the left side.      Popliteal pulses are 2+ on the right side and 2+ on the left side.       Dorsalis pedis pulses are 0 on the right side and 1+ on the left side.       Posterior tibial pulses are 0 on the right side and 0 on the left side.  Vascular examination reveals bilateral pigmented lower extremities with varicose veins.  2+ pitting edema.  Pulmonary/Chest: Effort normal and breath sounds normal.  Abdominal: Soft. Bowel sounds are normal.  Musculoskeletal: Normal range of motion.        General: No edema.  Neurological: He is alert.  Skin: Skin is warm and dry.  Psychiatric: He has a normal mood and affect.   Radiology: Dg  Chest 2 View  Result Date: 09/11/2018 CLINICAL DATA:  Palpitation. EXAM: CHEST - 2 VIEW COMPARISON:  Radiographs of August 29, 2018. FINDINGS: Stable cardiomediastinal silhouette. Status post coronary bypass graft. Atherosclerosis of thoracic aorta is noted. No pneumothorax or pleural effusion is noted. Both lungs are clear. The visualized skeletal structures are unremarkable. IMPRESSION: No active cardiopulmonary disease. Aortic Atherosclerosis (ICD10-I70.0). Electronically Signed   By: JMarijo ConceptionM.D.   On: 09/11/2018 13:37    Laboratory examination:   11/19/2017: Creatinine 1.6, EGFR 40/49, potassium 4.1, CMP otherwise normal.  CBC normal.  Cholesterol 161, triglycerides 160, HDL 55, LDL 74. Apolipoprotein B 86. TSH 2.69.  CMP Latest Ref Rng & Units 09/11/2018 08/29/2018 04/22/2015  Glucose 70 - 99 mg/dL 119(H) 101(H) -  BUN 8 - 23 mg/dL 20 23 -  Creatinine 0.61 - 1.24 mg/dL 1.39(H) 1.34 1.1  Sodium 135 - 145 mmol/L 139 140 -  Potassium 3.5 - 5.1 mmol/L 4.1 4.0 -  Chloride 98 - 111 mmol/L 103 102 -  CO2 22 - 32 mmol/L 24 30 -  Calcium 8.9 - 10.3 mg/dL 9.9 10.0 -  Total Protein 6.5 - 8.1 g/dL 7.2 6.9 -  Total Bilirubin 0.3 - 1.2 mg/dL 0.7 0.6 -  Alkaline Phos 38 - 126 U/L 47 43 -  AST 15 - 41 U/L 27 18 -  ALT 0 - 44 U/L 18 13 -   CBC Latest Ref Rng & Units 09/11/2018 08/29/2018 03/08/2015  WBC 4.0 - 10.5 K/uL 5.8 4.0 6.2  Hemoglobin 13.0 - 17.0 g/dL 12.5(L) 11.6(L) 12.6(L)  Hematocrit 39.0 - 52.0 % 38.3(L) 34.2(L) 37.2(L)  Platelets 150 - 400 K/uL 182 184.0 167   Lipid Panel     Component Value Date/Time   CHOL 84 03/03/2012 0751   TRIG 75 03/03/2012 0751   HDL 40 03/03/2012 0751   CHOLHDL 2.1 03/03/2012 0751   VLDL 15 03/03/2012 0751   LDLCALC 29 03/03/2012 0751  HEMOGLOBIN A1C Lab Results  Component Value Date   HGBA1C 6.1 (H) 03/03/2012   MPG 128 (H) 03/03/2012   TSH Recent Labs    09/11/18 1420  TSH 1.797   Medications   Current Outpatient Medications   Medication Instructions  . acetaminophen (TYLENOL) 500 mg, Oral, Daily at bedtime  . allopurinol (ZYLOPRIM) 150 mg, Daily  . amLODipine (NORVASC) 5 MG tablet TAKE 1 TABLET BY MOUTH DAILY  . cholecalciferol (VITAMIN D) 1,000 Units, Daily  . DiphenhydrAMINE HCl (BENADRYL ALLERGY PO) 25 mg, As needed  . EPINEPHrine (EPI-PEN) 0.3 mg, Intramuscular, As needed  . fish oil-omega-3 fatty acids 1 g, Daily  . Fluorouracil (TOLAK) 4 % CREA 1 application, Topical, Daily at bedtime  . furosemide (LASIX) 40 mg, Oral, Daily  . galantamine (RAZADYNE ER) 8 mg, Daily  . HONEY BEE VENOM IJ Injection, See admin instructions, Every eight weeks  . isosorbide mononitrate (IMDUR) 60 MG 24 hr tablet TAKE 1 TABLET(60 MG) BY MOUTH DAILY AFTER SUPPER  . metoprolol succinate (TOPROL-XL) 12.5 mg, Oral, Daily  . NITROSTAT 0.4 MG SL tablet 1 tablet, Every 5 min x3 PRN  . omega-3 acid ethyl esters (LOVAZA) 1 g, Oral, Daily  . pantoprazole (PROTONIX) 40 mg, Oral, Every other day  . potassium chloride (MICRO-K) 10 MEQ CR capsule 10 mEq, Oral, Daily, With Lasix  . PRESCRIPTION MEDICATION Cpap   . psyllium (METAMUCIL) 58.6 % powder 1 packet, Oral, Daily  . Rivaroxaban (XARELTO) 15 mg, Oral, Daily with supper  . rosuvastatin (CRESTOR) 10 mg, Daily  . telmisartan (MICARDIS) 20 mg, Oral, Daily, 1/2 tab by mouth once daily  . triamcinolone cream (KENALOG) 0.1 % 1 application, As needed  . vitamin B-12 (CYANOCOBALAMIN) 1,000 mcg, Daily  . Zetia 10 mg, Daily    Cardiac Studies:   Coronary angiogram 03/02/2012: 50-60% left main, 99% LAD, 40% OM, occluded RCA with L-R collaterals  Nuclear stress test   11/22/2017: 1. Lexiscan stress test with low level exercise was performed. Exercise capacity was not assessed. Stress symptoms included dyspnea. Peak effect blood pressure was 114/66 mmHg. The resting and stress electrocardiogram demonstrated normal sinus rhythm, RBBB, no resting arrhythmias and normal rest repolarization.  Stress EKG is non diagnostic for ischemia as it is a pharmacologic stress. 2. The overall quality of the study is good. Left ventricular cavity is noted to be mildly enlarged on the rest and stress studies. Gated SPECT imaging demonstrated mild hypokinesis of the basal inferolateral and mid inferolateral myocardial wall(s). The left ventricular ejection fraction was calculated or visually estimated to be 69%. Medium sized, medium intensity, fixed perfusion defect in mid to basal inferolateral myocardium suggests old LCx territory infarct with no periinfarct ischemia. 3. Low risk study.  Echocardiogram   11/25/2017: Left ventricle cavity is normal in size. Moderate concentric hypertrophy of the left ventricle. Normal global wall motion. Doppler evidence of grade III (restrictive) diastolic dysfunction, elevated LAP. Due to MV apparatus calcification, diastolic function calculation may be inaccurate. Calculated EF 56%. Severe calcification of the aortic valve annulus. Trileaflet aortic valve with mild (Grade I) regurgitation. Severe aortic valve leaflet calcification. Moderately restricted aortic valve leaflets. Mild aortic valve stenosis. Aortic valve peak pressure gradient of 29 and mean gradient of 18 mmHg, calculated aortic valve area 1.26 cm. Mild (Grade I) mitral regurgitation. Moderate calcification of the mitral valve annulus. Moderate mitral valve leaflet calcification. Normal mitral valve leaflet mobility. Mild tricuspid regurgitation. Mild to moderate pulmonary hypertension. Estimated pulmonary artery systolic pressure 42  mm Hg The aortic root is normal in size. Moderate atherosclerotic changes in the aorta. IVC is dilated with poor inspiration collapse consistent with elevated right atrial pressure. Compared to 03/05/2016, no significant change.  CTA of chest 04/30/2018: Stable ascending thoracic aortic aneurysm measuring up to 4.3 cm. Atherosclerotic changes thoracic aorta with irregular  plaque/small ulceration thoracic-abdominal aortic junction similar to prior exam.   Atherosclerotic changes origin of great vessels most notable involving the left subclavian artery. Atherosclerotic changes with narrowing superior mesenteric artery and right renal artery. Superior aspect of abdominal aortic aneurysm incompletely assessed.  Direct current cardioversion  09/11/2018:  Indication symptomatic A. Fibrillation.  Procedure: Using 10 mg of IV Etomidate  and 1.0 mg ativan  IV for achieving deep sedation, synchronized direct current cardioversion performed. Patient was delivered with 100 Joules of electricity X 1 with success to NSR. Patient tolerated the procedure well. No immediate complication noted.    Assessment     ICD-10-CM   1. Paroxysmal atrial fibrillation (HCC)  I48.0 EKG 12-Lead   CHA2DS2-VASc Score is 4.  Yearly risk of stroke: 4%.   2. Coronary artery disease of native artery of native heart with stable angina pectoris ([place])  I25.118   3. Essential hypertension  I10     EKG 09/12/2018: Probably sinus, Marked sinus bradycardia at the rate of 48 bpm with first-degree AV block normal axis, frequent PACs with aberrant conduction, episodes of sinus arrest following PAC with pause of 1.8 seconds.  EKG 08/11/2018: Probable sinus rhythm versus ectopic atrial rhythm with first-degree AV block at the rate of 74 bpm, left axis deviation, left anterior fascicular block.  Right bundle branch block.  Trifascicular block.  Compared to 02/06/2018, axis deviation is new but otherwise no change.  Recommendations:   Patient with known coronary disease S/P CABG, paroxysmal atrial fibrillation noted during post CABG, hypertension, thoracic aortic aneurysm, asymptomatic mild left subclavian artery stenosis who I had seen yesterday in the emergency room and underwent cardioversion for A. Fib, called [place] stating that he may be back in A. Fib with fatigue and irregular heart beat.  Since being  on beta blocker, his heart rate has dropped significantly and also has episodes of sinus arrest.  He had done this in the past as well and hence I'll discontinue metoprolol in the past.  I advised him to discontinue metoprolol, it is Highly likely that he probably has underlying conduction system disease and may end up needing a pacemaker.  I'd like to see him back in 10 days with repeat EKG.  I have simply reassured him.  Advised him to take it easy for the next couple days.  With regard to coronary artery disease, he has had stable angina which is nitrate responsive, continue present medical therapy.  Blood pressure is well controlled.  His thoracic aortic aneurysm is being followed by Dr. Roxy Manns.   Adrian Prows, MD, [facility] 09/12/2018, 2:11 PM [facility]. [place] Pager: [phone] Office: [phone] If no answer Cell [phone]

## 2018-09-22 ENCOUNTER — Ambulatory Visit (INDEPENDENT_AMBULATORY_CARE_PROVIDER_SITE_OTHER): Payer: Medicare Other | Admitting: Cardiology

## 2018-09-22 ENCOUNTER — Encounter: Payer: Self-pay | Admitting: Cardiology

## 2018-09-22 ENCOUNTER — Other Ambulatory Visit: Payer: Self-pay

## 2018-09-22 VITALS — BP 110/55 | HR 70 | Ht 73.0 in | Wt 249.7 lb

## 2018-09-22 DIAGNOSIS — I7121 Aneurysm of the ascending aorta, without rupture: Secondary | ICD-10-CM

## 2018-09-22 DIAGNOSIS — I480 Paroxysmal atrial fibrillation: Secondary | ICD-10-CM | POA: Diagnosis not present

## 2018-09-22 DIAGNOSIS — I350 Nonrheumatic aortic (valve) stenosis: Secondary | ICD-10-CM | POA: Diagnosis not present

## 2018-09-22 DIAGNOSIS — I712 Thoracic aortic aneurysm, without rupture: Secondary | ICD-10-CM

## 2018-09-22 DIAGNOSIS — I10 Essential (primary) hypertension: Secondary | ICD-10-CM | POA: Diagnosis not present

## 2018-09-22 NOTE — Progress Notes (Signed)
Primary Physician/Referring:  [name], MD  Patient ID: [name] [name], male    DOB: Oct 11, 1926, 85 y.o.   MRN: [id number]  Chief Complaint  Patient presents with  . Atrial Fibrillation  . Follow-up    10 day    HPI:    HPI: [name] [name]  is a 85 y.o. Caucasian male with vitamin B12 deficiency, mild dementia, hyperlipidemia, hypertension, CAD, chronic stable angina status post CABG x 3 and Maze and LAA amputation in 2014, h/o atrial fibrillation, obstructive sleep apnea not currently on CPAP, mild aortic stenosis by echo in 2018 with thoracic aortic aneurysm and dilation of ascending aorta and follows Dr. Lilly Cove (No correlation with TTE with regards to aneurysm), venous stasis edema.   Patient was seen in the emergency room and underwent direct current cardioversion for atrial fibrillation with RVR on 09/11/2018, repeat office visit the following day had revealed marked sinus bradycardia and episodes of sinus arrest hence I discontinued the metoprolol.  He is now here back on a ten-day visit.  States that his energy level has come back, he feels well, denies palpitations.  No chest pain, dyspnea is stable.  Past Medical History:  Diagnosis Date  . AAA (abdominal aortic aneurysm) ([place])   . Acute coronary syndrome ([place]) 02/29/2012  . Acute systolic heart failure ([place]) 03/03/2012  . Angina   . Ascending aorta dilatation (HCC) 03/13/2012   Fusiform dilatation of the ascending thoracic aorta discovered during surgery, max diameter 4.2-4.3 cm  . Atrial fibrillation ([employer]) 02/29/2012   Recurrent paroxysmal, new-onset  . BPH (benign prostatic hypertrophy)   . CAD (coronary artery disease)    Cath April 2012 40% left main, 50% LAD, 40% OM, occluded RCA with L-R collaterals   . CHF (congestive heart failure) ([place])   . Chronic venous insufficiency   . Dyspnea   . Glaucoma   . Gout   . Hyperlipidemia   . Hypertension   . Kidney stones   . MI (myocardial infarction) ([place])   . Obesity (BMI  30-39.9)   . Paroxysmal atrial fibrillation (HCC) 02/29/2012   Recurrent paroxysmal, new-onset   . S/P CABG x 3 03/06/2012   LIMA to LAD, SVG to OM, SVG to RCA, EVH via right thigh  . S/P Maze operation for atrial fibrillation 03/06/2012   Complete bilateral lesions set using bipolar radiofrequency and cryothermy ablation with clipping of LA appendage   Past Surgical History:  Procedure Laterality Date  . CARDIAC CATHETERIZATION    . CATARACT EXTRACTION, BILATERAL    . CHOLECYSTECTOMY N/A 03/24/2012   Procedure: LAPAROSCOPIC CHOLECYSTECTOMY WITH INTRAOPERATIVE CHOLANGIOGRAM;  Surgeon: Zenovia Jarred, MD;  Location: [facility];  Service: General;  Laterality: N/A;  . CORONARY ARTERY BYPASS GRAFT N/A 03/06/2012   Procedure: CORONARY ARTERY BYPASS GRAFTING (CABG);  Surgeon: Rexene Alberts, MD;  Location: [facility];  Service: Open Heart Surgery;  Laterality: N/A;  . ENDOVEIN HARVEST OF GREATER SAPHENOUS VEIN Right 03/06/2012   Procedure: ENDOVEIN HARVEST OF GREATER SAPHENOUS VEIN;  Surgeon: Rexene Alberts, MD;  Location: [facility];  Service: Open Heart Surgery;  Laterality: Right;  . INTRAOPERATIVE TRANSESOPHAGEAL ECHOCARDIOGRAM N/A 03/06/2012   Procedure: INTRAOPERATIVE TRANSESOPHAGEAL ECHOCARDIOGRAM;  Surgeon: Rexene Alberts, MD;  Location: [facility];  Service: Open Heart Surgery;  Laterality: N/A;  . LEFT HEART CATH Right 03/02/2012   Procedure: LEFT HEART CATH;  Surgeon: Sherren Mocha, MD;  Location: [facility] CATH LAB;  Service: Cardiovascular;  Laterality: Right;  Marland Kitchen MAZE  N/A 03/06/2012   Procedure: MAZE;  Surgeon: Rexene Alberts, MD;  Location: [facility];  Service: Open Heart Surgery;  Laterality: N/A;  . PROSTATECTOMY     Social History   Socioeconomic History  . Marital status: Married    Spouse name: Not on file  . Number of children: 4  . Years of education: Not on file  . Highest education level: Not on file  Occupational History  . 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 Financial  resource strain: Not on file  . Food insecurity    Worry: Not on file    Inability: Not on file  . Transportation needs    Medical: Not on file    Non-medical: Not on file  Tobacco Use  . Smoking status: Former Smoker    Packs/day: 0.50    Years: 15.00    Pack years: 7.50    Types: Cigarettes    Quit date: 11/18/1962    Years since quitting: 55.8  . Smokeless tobacco: Never Used  Substance and Sexual Activity  . Alcohol use: Yes    Alcohol/week: 1.0 standard drinks    Types: 1 Shots of liquor per week    Comment: daily  . Drug use: Never  . Sexual activity: Yes  Lifestyle  . Physical activity    Days per week: Not on file    Minutes per session: Not on file  . Stress: Not on file  Relationships  . Social Herbalist on phone: Not on file    Gets together: Not on file    Attends religious service: Not on file    Active member of club or organization: Not on file    Attends meetings of clubs or organizations: Not on file    Relationship status: Not on file  . Intimate partner violence    Fear of current or ex partner: Not on file    Emotionally abused: Not on file    Physically abused: Not on file    Forced sexual activity: Not on file  Other Topics Concern  . Not on file  Social History Narrative   Lives with wife in [place].     Very active physically without significant limitations.    Exercises regularly   ROS  Review of Systems  Constitution: Negative for chills, decreased appetite, malaise/fatigue and weight gain.  Cardiovascular: Positive for leg swelling. Negative for dyspnea on exertion, palpitations and syncope.  Endocrine: Negative for cold intolerance.  Hematologic/Lymphatic: Does not bruise/bleed easily.  Musculoskeletal: Positive for back pain, joint pain and neck pain. Negative for joint swelling.  Gastrointestinal: Negative for abdominal pain, anorexia, change in bowel habit, hematochezia and melena.  Neurological: Positive for  dizziness. Negative for headaches and light-headedness.  Psychiatric/Behavioral: Negative for depression and substance abuse.  All other systems reviewed and are negative.  Objective  Blood pressure (!) 110/55, pulse 70, height '6\' 1"'  (1.854 m), weight 249 lb 11.2 oz (113.3 kg), SpO2 98 %. Body mass index is 32.94 kg/m.    Flowsheets     08/11/18 11:35 AM  08/11/18 12:31 PM  08/11/18 12:42 PM   BP  128/66  133/66  123/67   BP Location  LeftArm  RightArm  LeftArm   Patient Position  Sitting  Sitting  Sitting   Cuff Size  Large  Normal  Normal   Pulse  75  74  61   SpO2  98%  95%  96%   Weight  246lb8oz(111.8kg)     Height  6'1"(1.853m        Physical Exam  Constitutional: He appears well-developed and well-nourished. No distress.  HENT:  Head: Atraumatic.  Eyes: Conjunctivae are normal.  Neck: Neck supple. No JVD present. No thyromegaly present.  Cardiovascular: Normal rate and regular rhythm. Exam reveals no gallop.  Murmur heard.  Scratchy midsystolic murmur is present with a grade of 3/6 at the upper right sternal border. Pulses:      Carotid pulses are 2+ on the right side and 2+ on the left side.      Radial pulses are 2+ on the right side and 2+ on the left side.       Femoral pulses are 2+ on the right side and 2+ on the left side.      Popliteal pulses are 2+ on the right side and 2+ on the left side.       Dorsalis pedis pulses are 0 on the right side and 1+ on the left side.       Posterior tibial pulses are 0 on the right side and 0 on the left side.  Vascular examination reveals bilateral pigmented lower extremities with varicose veins.  2+ pitting edema.  Pulmonary/Chest: Effort normal and breath sounds normal.  Abdominal: Soft. Bowel sounds are normal.  Musculoskeletal: Normal range of motion.        General: No edema.  Neurological: He is alert.  Skin: Skin is warm and dry.  Psychiatric: He has a normal mood and affect.    Radiology: No results found.  Laboratory examination:   11/19/2017: Creatinine 1.6, EGFR 40/49, potassium 4.1, CMP otherwise normal.  CBC normal.  Cholesterol 161, triglycerides 160, HDL 55, LDL 74. Apolipoprotein B 86. TSH 2.69.  CMP Latest Ref Rng & Units 09/11/2018 08/29/2018 04/22/2015  Glucose 70 - 99 mg/dL 119(H) 101(H) -  BUN 8 - 23 mg/dL 20 23 -  Creatinine 0.61 - 1.24 mg/dL 1.39(H) 1.34 1.1  Sodium 135 - 145 mmol/L 139 140 -  Potassium 3.5 - 5.1 mmol/L 4.1 4.0 -  Chloride 98 - 111 mmol/L 103 102 -  CO2 22 - 32 mmol/L 24 30 -  Calcium 8.9 - 10.3 mg/dL 9.9 10.0 -  Total Protein 6.5 - 8.1 g/dL 7.2 6.9 -  Total Bilirubin 0.3 - 1.2 mg/dL 0.7 0.6 -  Alkaline Phos 38 - 126 U/L 47 43 -  AST 15 - 41 U/L 27 18 -  ALT 0 - 44 U/L 18 13 -   CBC Latest Ref Rng & Units 09/11/2018 08/29/2018 03/08/2015  WBC 4.0 - 10.5 K/uL 5.8 4.0 6.2  Hemoglobin 13.0 - 17.0 g/dL 12.5(L) 11.6(L) 12.6(L)  Hematocrit 39.0 - 52.0 % 38.3(L) 34.2(L) 37.2(L)  Platelets 150 - 400 K/uL 182 184.0 167   Lipid Panel     Component Value Date/Time   CHOL 84 03/03/2012 0751   TRIG 75 03/03/2012 0751   HDL 40 03/03/2012 0751   CHOLHDL 2.1 03/03/2012 0751   VLDL 15 03/03/2012 0751   LDLCALC 29 03/03/2012 0751   HEMOGLOBIN A1C Lab Results  Component Value Date   HGBA1C 6.1 (H) 03/03/2012   MPG 128 (H) 03/03/2012   TSH Recent Labs    09/11/18 1420  TSH 1.797   Medications   Current Outpatient Medications  Medication Instructions  . acetaminophen (TYLENOL) 500 mg, Oral, Daily at bedtime  . allopurinol (ZYLOPRIM) 150 mg, Daily  . amLODipine (NORVASC) 5  MG tablet TAKE 1 TABLET BY MOUTH DAILY  . cholecalciferol (VITAMIN D) 1,000 Units, Daily  . DiphenhydrAMINE HCl (BENADRYL ALLERGY PO) 25 mg, As needed  . EPINEPHrine (EPI-PEN) 0.3 mg, Intramuscular, As needed  . fish oil-omega-3 fatty acids 1 g, Daily  . Fluorouracil (TOLAK) 4 % CREA 1 application, Topical, Daily at bedtime  . furosemide (LASIX) 40 mg,  Oral, Daily  . galantamine (RAZADYNE ER) 8 mg, Daily  . HONEY BEE VENOM IJ Injection, See admin instructions, Every eight weeks  . isosorbide mononitrate (IMDUR) 60 MG 24 hr tablet TAKE 1 TABLET(60 MG) BY MOUTH DAILY AFTER SUPPER  . NITROSTAT 0.4 MG SL tablet 1 tablet, Every 5 min x3 PRN  . omega-3 acid ethyl esters (LOVAZA) 1 g, Oral, Daily  . pantoprazole (PROTONIX) 40 mg, Oral, Every other day  . potassium chloride (MICRO-K) 10 MEQ CR capsule 10 mEq, Oral, Daily, With Lasix  . PRESCRIPTION MEDICATION Cpap   . psyllium (METAMUCIL) 58.6 % powder 1 packet, Oral, Daily  . Rivaroxaban (XARELTO) 15 mg, Oral, Daily with supper  . rosuvastatin (CRESTOR) 10 mg, Daily  . telmisartan (MICARDIS) 20 mg, Oral, Daily, 1/2 tab by mouth once daily  . triamcinolone cream (KENALOG) 0.1 % 1 application, As needed  . vitamin B-12 (CYANOCOBALAMIN) 1,000 mcg, Daily  . Zetia 10 mg, Daily    Cardiac Studies:   Coronary angiogram 03/02/2012: 50-60% left main, 99% LAD, 40% OM, occluded RCA with L-R collaterals  Nuclear stress test   11/22/2017: 1. Lexiscan stress test with low level exercise was performed. Exercise capacity was not assessed. Stress symptoms included dyspnea. Peak effect blood pressure was 114/66 mmHg. The resting and stress electrocardiogram demonstrated normal sinus rhythm, RBBB, no resting arrhythmias and normal rest repolarization. Stress EKG is non diagnostic for ischemia as it is a pharmacologic stress. 2. The overall quality of the study is good. Left ventricular cavity is noted to be mildly enlarged on the rest and stress studies. Gated SPECT imaging demonstrated mild hypokinesis of the basal inferolateral and mid inferolateral myocardial wall(s). The left ventricular ejection fraction was calculated or visually estimated to be 69%. Medium sized, medium intensity, fixed perfusion defect in mid to basal inferolateral myocardium suggests old LCx territory infarct with no periinfarct ischemia.  3. Low risk study.  Echocardiogram   11/25/2017: Left ventricle cavity is normal in size. Moderate concentric hypertrophy of the left ventricle. Normal global wall motion. Doppler evidence of grade III (restrictive) diastolic dysfunction, elevated LAP. Due to MV apparatus calcification, diastolic function calculation may be inaccurate. Calculated EF 56%. Severe calcification of the aortic valve annulus. Trileaflet aortic valve with mild (Grade I) regurgitation. Severe aortic valve leaflet calcification. Moderately restricted aortic valve leaflets. Mild aortic valve stenosis. Aortic valve peak pressure gradient of 29 and mean gradient of 18 mmHg, calculated aortic valve area 1.26 cm. Mild (Grade I) mitral regurgitation. Moderate calcification of the mitral valve annulus. Moderate mitral valve leaflet calcification. Normal mitral valve leaflet mobility. Mild tricuspid regurgitation. Mild to moderate pulmonary hypertension. Estimated pulmonary artery systolic pressure 42 mm Hg The aortic root is normal in size. Moderate atherosclerotic changes in the aorta. IVC is dilated with poor inspiration collapse consistent with elevated right atrial pressure. Compared to 03/05/2016, no significant change.  CTA of chest 04/30/2018: Stable ascending thoracic aortic aneurysm measuring up to 4.3 cm. Atherosclerotic changes thoracic aorta with irregular plaque/small ulceration thoracic-abdominal aortic junction similar to prior exam.   Atherosclerotic changes origin of great vessels most notable involving  the left subclavian artery. Atherosclerotic changes with narrowing superior mesenteric artery and right renal artery. Superior aspect of abdominal aortic aneurysm incompletely assessed.  Direct current cardioversion  09/11/2018:  Indication symptomatic A. Fibrillation.  Procedure: Using 10 mg of IV Etomidate  and 1.0 mg ativan  IV for achieving deep sedation, synchronized direct current cardioversion  performed. Patient was delivered with 100 Joules of electricity X 1 with success to NSR. Patient tolerated the procedure well. No immediate complication noted.    Assessment     ICD-10-CM   1. Paroxysmal atrial fibrillation (HCC)  I48.0 EKG 12-Lead   CHA2DS2-VASc Score is 4.  Yearly risk of stroke: 4%.   2. Essential hypertension  I10   3. Mild aortic stenosis  I35.0 PCV ECHOCARDIOGRAM COMPLETE  4. Thoracic ascending aortic aneurysm ([place])  I71.2 PCV ECHOCARDIOGRAM COMPLETE    EKG 09/22/2018: Probably ectopic atrial rhythm at the rate of 65 bpm, left axis deviation, left anterior fascicular block.  Right bundle branch block.  No evidence of ischemia. No significant change from  EKG 08/11/2018, V rate 74/min.   EKG 09/12/2018: Probably sinus, Marked sinus bradycardia at the rate of 48 bpm with first-degree AV block normal axis, frequent PACs with aberrant conduction, episodes of sinus arrest following PAC with pause of 1.8 seconds.  Recommendations:   Patient with known coronary disease S/P CABG, paroxysmal atrial fibrillation noted during post CABG, hypertension, thoracic aortic aneurysm, asymptomatic mild left subclavian artery stenosis who I had seen yesterday in the emergency room and underwent cardioversion for A. Fib, called [place] stating that he may be back in A. Fib with fatigue and irregular heart beat.  Since being on beta blocker, his heart rate has dropped significantly and also has episodes of sinus arrest. I discontinued Metoprolol last OV and Now the heart rate is improved, I think he probably has underlying sinus rhythm now him a clear morphology very similar to EKG that was done 2 months ago.  Aortic stenotic murmur appears slightly more prominent.  In view of conduction system disease, aortic valve stenosis change in murmur intensity, I will obtain and repeat echocardiogram.  I'll see him back in 6 months or sooner if there is any other problems.  I suspect he'll probably need a  permanent pacemaker at some point. His aortic aneurysm is being monitored by Lilly Cove, MD.  With regard to coronary artery disease, he has had stable angina which is nitrate responsive, continue present medical therapy.  Blood pressure is well controlled.  His thoracic aortic aneurysm is being followed by Dr. Roxy Manns.   Adrian Prows, MD, [facility] 09/22/2018, 12:31 PM [facility]. [place] Pager: [phone] Office: [phone] If no answer Cell [phone]

## 2018-10-02 ENCOUNTER — Other Ambulatory Visit: Payer: Self-pay | Admitting: Pulmonary Disease

## 2018-10-02 ENCOUNTER — Telehealth: Payer: Self-pay | Admitting: Pulmonary Disease

## 2018-10-02 NOTE — Telephone Encounter (Signed)
Call returned to patient, he states he already spoke with someone and no longer needs anything.   Nothing further needed at this time.

## 2018-10-10 ENCOUNTER — Other Ambulatory Visit (HOSPITAL_COMMUNITY): Payer: Medicare Other

## 2018-10-13 ENCOUNTER — Other Ambulatory Visit (HOSPITAL_COMMUNITY)
Admission: RE | Admit: 2018-10-13 | Discharge: 2018-10-13 | Disposition: A | Discharge status: Home / Self Care | Payer: Medicare Other | Source: Ambulatory Visit | Attending: Pulmonary Disease | Admitting: Pulmonary Disease

## 2018-10-13 DIAGNOSIS — Z20828 Contact with and (suspected) exposure to other viral communicable diseases: Secondary | ICD-10-CM | POA: Insufficient documentation

## 2018-10-13 DIAGNOSIS — Z01812 Encounter for preprocedural laboratory examination: Secondary | ICD-10-CM | POA: Insufficient documentation

## 2018-10-13 LAB — SARS CORONAVIRUS 2 (TAT 6-24 HRS): SARS Coronavirus 2: NEGATIVE

## 2018-10-16 ENCOUNTER — Ambulatory Visit (INDEPENDENT_AMBULATORY_CARE_PROVIDER_SITE_OTHER): Payer: Medicare Other | Admitting: Pulmonary Disease

## 2018-10-16 ENCOUNTER — Encounter: Payer: Self-pay | Admitting: Pulmonary Disease

## 2018-10-16 ENCOUNTER — Ambulatory Visit: Payer: Medicare Other | Admitting: Pulmonary Disease

## 2018-10-16 ENCOUNTER — Other Ambulatory Visit: Payer: Self-pay

## 2018-10-16 VITALS — BP 130/66 | HR 65

## 2018-10-16 DIAGNOSIS — Z9989 Dependence on other enabling machines and devices: Secondary | ICD-10-CM | POA: Diagnosis not present

## 2018-10-16 DIAGNOSIS — R0602 Shortness of breath: Secondary | ICD-10-CM

## 2018-10-16 DIAGNOSIS — G4733 Obstructive sleep apnea (adult) (pediatric): Secondary | ICD-10-CM

## 2018-10-16 LAB — PULMONARY FUNCTION TEST
DL/VA % pred: 121 %
DL/VA: 4.54 ml/min/mmHg/L
DLCO unc % pred: 96 %
DLCO unc: 23.62 ml/min/mmHg
FEF 25-75 Post: 1.49 L/sec
FEF 25-75 Pre: 1.18 L/sec
FEF2575-%Change-Post: 26 %
FEF2575-%Pred-Post: 89 %
FEF2575-%Pred-Pre: 70 %
FEV1-%Change-Post: 5 %
FEV1-%Pred-Post: 78 %
FEV1-%Pred-Pre: 74 %
FEV1-Post: 2.13 L
FEV1-Pre: 2.01 L
FEV1FVC-%Change-Post: -2 %
FEV1FVC-%Pred-Pre: 100 %
FEV6-%Change-Post: 8 %
FEV6-%Pred-Post: 84 %
FEV6-%Pred-Pre: 77 %
FEV6-Post: 3.05 L
FEV6-Pre: 2.82 L
FEV6FVC-%Change-Post: 0 %
FEV6FVC-%Pred-Post: 105 %
FEV6FVC-%Pred-Pre: 106 %
FVC-%Change-Post: 8 %
FVC-%Pred-Post: 79 %
FVC-%Pred-Pre: 73 %
FVC-Post: 3.12 L
FVC-Pre: 2.88 L
Post FEV1/FVC ratio: 68 %
Post FEV6/FVC ratio: 98 %
Pre FEV1/FVC ratio: 70 %
Pre FEV6/FVC Ratio: 98 %
RV % pred: 96 %
RV: 2.86 L
TLC % pred: 78 %
TLC: 5.87 L

## 2018-10-16 NOTE — Progress Notes (Signed)
[facility]  Chief Complaint  Patient presents with  . Follow-up    Constitutional:  BP 130/66 (BP Location: Left Arm, Cuff Size: Normal)   Pulse 65   SpO2 97%   Past Medical History:  HTN, CAD s/p CABG 2014, A fib s/p MAZE 0347, systolic CHF, HLD, BPH, Gout, Glaucoma, Nephrolithiasis  Brief Summary:  [name] [name] is a 85 y.o. male with obstructive sleep apnea.  He was seen by Aaron Edelman recently.  Concern for dyspnea.  Had CXR in September that was unrevealing.  PFT today showed very mild restriction likely related to obesity w/o evidence for diffusion defect, or obstruction.  He does not have cough, wheeze, chest congestion, or sputum.  No history of asthma.  Gets winded and has to rest if he does too much activity.   Physical Exam:   Appearance - well kempt   ENMT - no sinus tenderness, no nasal discharge, no oral exudate  Neck - no masses, trachea midline, no thyromegaly, no elevation in JVP  Respiratory - normal appearance of chest wall, normal respiratory effort w/o accessory muscle use, no dullness on percussion, no wheezing or rales  CV - s1s2 regular rate and rhythm, 3/6 murmurs, no peripheral edema, radial pulses symmetric  GI - soft, non tender  Lymph - no adenopathy noted in neck and axillary areas  MSK - normal gait  Ext - no cyanosis, clubbing, or joint inflammation noted  Skin - no rashes, lesions, or ulcers  Neuro - normal strength, oriented x 3  Psych - normal mood and affect    Assessment/Plan:   Dyspnea. - recent CXR and PFT unrevealing for pulmonary cause of his symptoms - likely from cardiac disease and deconditioning with obesity - he has cardiology follow up with Dr.Ganji  Obstructive sleep apnea. - he is compliant with CPAP and reports benefit - continue auto CPAP range 8 to 15 cm H2O   Patient Instructions  Follow up in 6 months   A total of  27 minutes were spent face to face with the  patient and more than half of that time involved counseling or coordination of care.   Chesley Mires, MD [facility] Pager: [phone] 10/16/2018, 11:34 AM  Flow Sheet   Pulmonary tests:  PFT 10/16/18 >> FEV1 2.13 (78%), FEV1% 68, TLC 5.87 (78%), DLCO 96%, no BD  Sleep tests:  PSG 07/29/14 >> AHI 29.5, SaO2 low 88%.  CPAP 12 >> AHI 0 Auto CPAP 01/12/18 to 02/10/18 >> used on 29 of 30 nights with average 8 hrs 48 min.  Average AHI 10 with median CPAP 9 and 95 th percentile CPAP 11 cm H2O  Cardiac tests:  Echo 10/25/14 >> mod LVH, EF 60 to 65%, mild/mod AR, mod LA dilation, PAS 47 mmHg  Medications:   Allergies as of 10/16/2018      Reactions   Amoxicillin Other (See Comments)   Headache, debilitated   Bee Venom Anaphylaxis   Morphine And Related Other (See Comments)   hypotension      Medication List       Accurate as of October 16, 2018 11:34 AM. If you have any questions, ask your nurse or doctor.        acetaminophen 500 MG tablet Commonly known as: TYLENOL Take 500 mg by mouth at bedtime.   allopurinol 300 MG tablet Commonly known as: ZYLOPRIM Take 150 mg by mouth Daily.   amLODipine 5 MG tablet Commonly known as:  NORVASC TAKE 1 TABLET BY MOUTH DAILY What changed: when to take this   BENADRYL ALLERGY PO Take 25 mg by mouth as needed.   cholecalciferol 1000 units tablet Commonly known as: VITAMIN D Take 1,000 Units by mouth daily.   EPINEPHrine 0.3 mg/0.3 mL [place] injection Commonly known as: EPI-PEN Inject 0.3 mLs (0.3 mg total) into the muscle as needed for anaphylaxis.   fish oil-omega-3 fatty acids 1000 MG capsule Take 1 g by mouth daily.   furosemide 40 MG tablet Commonly known as: LASIX Take 1 tablet (40 mg total) by mouth daily.   galantamine 8 MG 24 hr capsule Commonly known as: RAZADYNE ER Take 8 mg by mouth Daily.   HONEY BEE VENOM IJ Inject as directed See admin instructions. Every eight weeks   isosorbide  mononitrate 60 MG 24 hr tablet Commonly known as: IMDUR TAKE 1 TABLET(60 MG) BY MOUTH DAILY AFTER SUPPER What changed: See the new instructions.   Nitrostat 0.4 MG SL tablet Generic drug: nitroGLYCERIN Place 1 tablet under the tongue every 5 (five) minutes x 3 doses as needed for chest pain.   omega-3 acid ethyl esters 1 g capsule Commonly known as: LOVAZA Take 1 g by mouth daily.   pantoprazole 40 MG tablet Commonly known as: PROTONIX Take 40 mg by mouth every other day.   potassium chloride 10 MEQ CR capsule Commonly known as: MICRO-K Take 1 capsule (10 mEq total) by mouth daily. With Lasix   PRESCRIPTION MEDICATION Cpap   psyllium 58.6 % powder Commonly known as: METAMUCIL Take 1 packet by mouth daily.   Rivaroxaban 15 MG Tabs tablet Commonly known as: XARELTO Take 1 tablet (15 mg total) by mouth daily with supper. What changed:   how much to take  when to take this   rosuvastatin 20 MG tablet Commonly known as: CRESTOR Take 10 mg by mouth daily.   telmisartan 40 MG tablet Commonly known as: MICARDIS Take 20 mg by mouth daily. 1/2 tab by mouth once daily   [place] 4 % Crea Generic drug: Fluorouracil Apply 1 application topically at bedtime.   triamcinolone cream 0.1 % Commonly known as: KENALOG Apply 1 application topically as needed (dry skin irritation).   vitamin B-12 1000 MCG tablet Commonly known as: CYANOCOBALAMIN Take 1,000 mcg by mouth daily.   Zetia 10 MG tablet Generic drug: ezetimibe Take 10 mg by mouth Daily.       Past Surgical History:  He  has a past surgical history that includes Cardiac catheterization; Prostatectomy; Cataract extraction, bilateral; Coronary artery bypass graft (N/A, 03/06/2012); MAZE (N/A, 03/06/2012); Intraoprative transesophageal echocardiogram (N/A, 03/06/2012); Endoharvest vein of greater saphenous vein (Right, 03/06/2012); Cholecystectomy (N/A, 03/24/2012); and left heart cath (Right, 03/02/2012).  Family History:  His  family history includes Dementia in his mother; Stroke in his brother.  Social History:  He  reports that he quit smoking about 55 years ago. His smoking use included cigarettes. He has a 7.50 pack-year smoking history. He has never used smokeless tobacco. He reports current alcohol use of about 1.0 standard drinks of alcohol per week. He reports that he does not use drugs.

## 2018-10-16 NOTE — Progress Notes (Signed)
PFT done today. 

## 2018-10-16 NOTE — Progress Notes (Signed)
Will be discussed at appointment with Dr. Halford Chessman today.  Nothing further is needed.  Wyn Quaker FNP

## 2018-10-16 NOTE — Patient Instructions (Signed)
Follow up in 6 months 

## 2018-10-25 ENCOUNTER — Other Ambulatory Visit: Payer: Self-pay | Admitting: Cardiology

## 2018-10-29 ENCOUNTER — Other Ambulatory Visit: Payer: Self-pay

## 2018-10-29 MED ORDER — POTASSIUM CHLORIDE ER 10 MEQ PO CPCR: 10.0000 meq | ORAL_CAPSULE | Freq: Every day | ORAL | 1 refills | Status: DC

## 2018-11-04 ENCOUNTER — Other Ambulatory Visit: Payer: Self-pay

## 2018-11-04 ENCOUNTER — Ambulatory Visit: Payer: Self-pay

## 2018-11-04 ENCOUNTER — Ambulatory Visit (INDEPENDENT_AMBULATORY_CARE_PROVIDER_SITE_OTHER): Payer: Medicare Other

## 2018-11-04 ENCOUNTER — Other Ambulatory Visit: Payer: Self-pay | Admitting: Cardiology

## 2018-11-04 DIAGNOSIS — T63441D Toxic effect of venom of bees, accidental (unintentional), subsequent encounter: Secondary | ICD-10-CM

## 2018-11-05 ENCOUNTER — Other Ambulatory Visit: Payer: Self-pay | Admitting: Cardiology

## 2018-11-13 ENCOUNTER — Ambulatory Visit: Payer: Medicare Other | Admitting: Cardiology

## 2018-12-15 DIAGNOSIS — L905 Scar conditions and fibrosis of skin: Secondary | ICD-10-CM | POA: Diagnosis not present

## 2018-12-15 DIAGNOSIS — D485 Neoplasm of uncertain behavior of skin: Secondary | ICD-10-CM | POA: Diagnosis not present

## 2018-12-30 ENCOUNTER — Ambulatory Visit (INDEPENDENT_AMBULATORY_CARE_PROVIDER_SITE_OTHER): Payer: Medicare Other

## 2018-12-30 DIAGNOSIS — T63441D Toxic effect of venom of bees, accidental (unintentional), subsequent encounter: Secondary | ICD-10-CM

## 2019-01-12 ENCOUNTER — Ambulatory Visit: Payer: Medicare Other | Attending: Internal Medicine

## 2019-01-12 DIAGNOSIS — Z23 Encounter for immunization: Secondary | ICD-10-CM

## 2019-01-12 NOTE — Progress Notes (Signed)
.    Covid-19 Vaccination Clinic  Name:  [name] [name]    MRN: [id number] DOB: [birth year]  01/12/2019  [name] [name] was observed post Covid-19 immunization for 30 minutes based on pre-vaccination screening   without incidence. He was provided with Vaccine Information Sheet and instruction to access the V-Safe system.   [name] [name] was instructed to call 911 with any severe reactions post vaccine: Marland Kitchen Difficulty breathing  . Swelling of your face and throat  . A fast heartbeat  . A bad rash all over your body  . Dizziness and weakness    Immunizations Administered    Name Date Dose VIS Date Route   Pfizer COVID-19 Vaccine 01/12/2019 12:30 PM 0.3 mL 12/12/2018 Intramuscular   Manufacturer: [place]   Lot: [id number]   [place]: [id number]

## 2019-01-18 ENCOUNTER — Other Ambulatory Visit: Payer: Self-pay | Admitting: Cardiology

## 2019-01-19 DIAGNOSIS — I129 Hypertensive chronic kidney disease with stage 1 through stage 4 chronic kidney disease, or unspecified chronic kidney disease: Secondary | ICD-10-CM | POA: Diagnosis not present

## 2019-01-19 DIAGNOSIS — Z125 Encounter for screening for malignant neoplasm of prostate: Secondary | ICD-10-CM | POA: Diagnosis not present

## 2019-01-19 DIAGNOSIS — R7301 Impaired fasting glucose: Secondary | ICD-10-CM | POA: Diagnosis not present

## 2019-01-19 DIAGNOSIS — M109 Gout, unspecified: Secondary | ICD-10-CM | POA: Diagnosis not present

## 2019-01-19 DIAGNOSIS — E7849 Other hyperlipidemia: Secondary | ICD-10-CM | POA: Diagnosis not present

## 2019-01-19 DIAGNOSIS — R82998 Other abnormal findings in urine: Secondary | ICD-10-CM | POA: Diagnosis not present

## 2019-01-26 DIAGNOSIS — C439 Malignant melanoma of skin, unspecified: Secondary | ICD-10-CM | POA: Insufficient documentation

## 2019-01-26 DIAGNOSIS — E538 Deficiency of other specified B group vitamins: Secondary | ICD-10-CM | POA: Diagnosis not present

## 2019-01-26 DIAGNOSIS — Z0000 Encounter for general adult medical examination without abnormal findings: Secondary | ICD-10-CM | POA: Diagnosis not present

## 2019-01-26 DIAGNOSIS — D649 Anemia, unspecified: Secondary | ICD-10-CM | POA: Insufficient documentation

## 2019-01-26 DIAGNOSIS — M5030 Other cervical disc degeneration, unspecified cervical region: Secondary | ICD-10-CM | POA: Diagnosis not present

## 2019-01-26 DIAGNOSIS — M79604 Pain in right leg: Secondary | ICD-10-CM | POA: Insufficient documentation

## 2019-01-26 DIAGNOSIS — I77810 Thoracic aortic ectasia: Secondary | ICD-10-CM | POA: Diagnosis not present

## 2019-01-26 DIAGNOSIS — F0390 Unspecified dementia without behavioral disturbance: Secondary | ICD-10-CM | POA: Diagnosis not present

## 2019-01-29 ENCOUNTER — Telehealth (HOSPITAL_COMMUNITY): Payer: Self-pay

## 2019-01-29 NOTE — Telephone Encounter (Signed)

## 2019-01-30 ENCOUNTER — Ambulatory Visit (HOSPITAL_COMMUNITY)
Admission: RE | Admit: 2019-01-30 | Discharge: 2019-01-30 | Disposition: A | Discharge status: Home / Self Care | Payer: Medicare Other | Source: Ambulatory Visit | Attending: Surgery | Admitting: Surgery

## 2019-01-30 ENCOUNTER — Ambulatory Visit (INDEPENDENT_AMBULATORY_CARE_PROVIDER_SITE_OTHER)
Admission: RE | Admit: 2019-01-30 | Discharge: 2019-01-30 | Disposition: A | Discharge status: Home / Self Care | Payer: Medicare Other | Source: Ambulatory Visit | Attending: Surgery | Admitting: Surgery

## 2019-01-30 ENCOUNTER — Other Ambulatory Visit (HOSPITAL_COMMUNITY): Payer: Self-pay | Admitting: Internal Medicine

## 2019-01-30 ENCOUNTER — Other Ambulatory Visit: Payer: Self-pay

## 2019-01-30 DIAGNOSIS — I739 Peripheral vascular disease, unspecified: Secondary | ICD-10-CM | POA: Diagnosis not present

## 2019-01-31 ENCOUNTER — Other Ambulatory Visit: Payer: Self-pay | Admitting: Internal Medicine

## 2019-01-31 DIAGNOSIS — M5136 Other intervertebral disc degeneration, lumbar region: Secondary | ICD-10-CM

## 2019-02-02 ENCOUNTER — Ambulatory Visit: Payer: Medicare Other | Attending: Internal Medicine

## 2019-02-02 DIAGNOSIS — M25519 Pain in unspecified shoulder: Secondary | ICD-10-CM | POA: Insufficient documentation

## 2019-02-02 DIAGNOSIS — Z23 Encounter for immunization: Secondary | ICD-10-CM | POA: Insufficient documentation

## 2019-02-02 NOTE — Progress Notes (Signed)
   Covid-19 Vaccination Clinic  Name:  [name] [name]    MRN: [id number] DOB: 1926-01-03  02/02/2019  [name] was observed post Covid-19 immunization for 30 minutes based on pre-vaccination screening without incidence. He was provided with Vaccine Information Sheet and instruction to access the V-Safe system.   [name] was instructed to call 911 with any severe reactions post vaccine: Marland Kitchen Difficulty breathing  . Swelling of your face and throat  . A fast heartbeat  . A bad rash all over your body  . Dizziness and weakness    Immunizations Administered    Name Date Dose VIS Date Route   Pfizer COVID-19 Vaccine 02/02/2019 11:47 AM 0.3 mL 12/12/2018 Intramuscular   Manufacturer: [place]   Lot: [id number]   [place]: [id number]

## 2019-02-06 DIAGNOSIS — R531 Weakness: Secondary | ICD-10-CM | POA: Insufficient documentation

## 2019-02-06 DIAGNOSIS — N1831 Chronic kidney disease, stage 3a: Secondary | ICD-10-CM | POA: Diagnosis not present

## 2019-02-06 DIAGNOSIS — M791 Myalgia, unspecified site: Secondary | ICD-10-CM | POA: Diagnosis not present

## 2019-02-06 DIAGNOSIS — M353 Polymyalgia rheumatica: Secondary | ICD-10-CM | POA: Diagnosis not present

## 2019-02-06 DIAGNOSIS — I129 Hypertensive chronic kidney disease with stage 1 through stage 4 chronic kidney disease, or unspecified chronic kidney disease: Secondary | ICD-10-CM | POA: Diagnosis not present

## 2019-02-06 HISTORY — DX: Polymyalgia rheumatica: M35.3

## 2019-02-11 ENCOUNTER — Other Ambulatory Visit: Payer: Self-pay

## 2019-02-11 ENCOUNTER — Encounter: Payer: Self-pay | Admitting: Vascular Surgery

## 2019-02-11 ENCOUNTER — Ambulatory Visit: Payer: Medicare Other | Admitting: Vascular Surgery

## 2019-02-11 VITALS — BP 137/72 | HR 68 | Temp 96.8°F | Resp 18 | Ht 71.5 in | Wt 241.9 lb

## 2019-02-11 DIAGNOSIS — I739 Peripheral vascular disease, unspecified: Secondary | ICD-10-CM | POA: Diagnosis not present

## 2019-02-11 DIAGNOSIS — M791 Myalgia, unspecified site: Secondary | ICD-10-CM | POA: Insufficient documentation

## 2019-02-11 NOTE — Progress Notes (Signed)
Referring Physician: Dr Joylene Draft  Patient name: [name] [name] MRN: [id number] DOB: 03/31/1926 Sex: male  REASON FOR CONSULT: Bilateral leg pain  HPI: [name] [name] is a 86 y.o. male, referred by Dr. Haynes Kerns for bilateral leg pain.  The patient started having pain in his legs a few months ago.  This progressed very rapidly to the point that he was debilitated where he could not even get out of bed.  He states that the pain was continuous pain extending from the thigh all the way down to the feet.  The pain has now improved dramatically after being on prednisone for 1 week.  Apparently there is now a presumed diagnosis of polymyalgia rheumatica.  The patient also was recently on a statin and this has been discontinued.  Other medical problems include paroxysmal atrial fibrillation, ascending aortic aneurysm, coronary artery disease, congestive heart failure, all are currently stable.  Patient states that he had an elevated sed rate as well as some other markers of inflammatory reaction.  He is on Xarelto for his atrial fibrillation.  Past Medical History:  Diagnosis Date  . AAA (abdominal aortic aneurysm) ([place])   . Acute coronary syndrome ([place]) 02/29/2012  . Acute systolic heart failure ([place]) 03/03/2012  . Angina   . Ascending aorta dilatation ([facility]) 03/13/2012   Fusiform dilatation of the ascending thoracic aorta discovered during surgery, max diameter 4.2-4.3 cm  . Atrial fibrillation ([place]) 02/29/2012   Recurrent paroxysmal, new-onset  . BPH (benign prostatic hypertrophy)   . CAD (coronary artery disease)    Cath April 2012 40% left main, 50% LAD, 40% OM, occluded RCA with L-R collaterals   . CHF (congestive heart failure) ([place])   . Chronic venous insufficiency   . Dyspnea   . Glaucoma   . Gout   . Hyperlipidemia   . Hypertension   . Kidney stones   . MI (myocardial infarction) ([place])   . Obesity (BMI 30-39.9)   . Paroxysmal atrial fibrillation ([facility]) 02/29/2012   Recurrent paroxysmal,  new-onset   . S/P CABG x 3 03/06/2012   LIMA to LAD, SVG to OM, SVG to RCA, EVH via right thigh  . S/P Maze operation for atrial fibrillation 03/06/2012   Complete bilateral lesions set using bipolar radiofrequency and cryothermy ablation with clipping of LA appendage   Past Surgical History:  Procedure Laterality Date  . CARDIAC CATHETERIZATION    . CATARACT EXTRACTION, BILATERAL    . CHOLECYSTECTOMY N/A 03/24/2012   Procedure: LAPAROSCOPIC CHOLECYSTECTOMY WITH INTRAOPERATIVE CHOLANGIOGRAM;  Surgeon: Zenovia Jarred, MD;  Location: [facility];  Service: General;  Laterality: N/A;  . CORONARY ARTERY BYPASS GRAFT N/A 03/06/2012   Procedure: CORONARY ARTERY BYPASS GRAFTING (CABG);  Surgeon: Rexene Alberts, MD;  Location: [facility];  Service: Open Heart Surgery;  Laterality: N/A;  . ENDOVEIN HARVEST OF GREATER SAPHENOUS VEIN Right 03/06/2012   Procedure: ENDOVEIN HARVEST OF GREATER SAPHENOUS VEIN;  Surgeon: Rexene Alberts, MD;  Location: [facility];  Service: Open Heart Surgery;  Laterality: Right;  . INTRAOPERATIVE TRANSESOPHAGEAL ECHOCARDIOGRAM N/A 03/06/2012   Procedure: INTRAOPERATIVE TRANSESOPHAGEAL ECHOCARDIOGRAM;  Surgeon: Rexene Alberts, MD;  Location: [facility];  Service: Open Heart Surgery;  Laterality: N/A;  . LEFT HEART CATH Right 03/02/2012   Procedure: LEFT HEART CATH;  Surgeon: Sherren Mocha, MD;  Location: [facility] CATH LAB;  Service: Cardiovascular;  Laterality: Right;  Marland Kitchen MAZE N/A 03/06/2012   Procedure: MAZE;  Surgeon: Rexene Alberts, MD;  Location: [facility];  Service: Open Heart Surgery;  Laterality: N/A;  . PROSTATECTOMY      Family History  Problem Relation Age of Onset  . Dementia Mother   . Stroke Brother     SOCIAL HISTORY: Social History   Socioeconomic History  . Marital status: Married    Spouse name: Not on file  . Number of children: 4  . Years of education: Not on file  . Highest education level: Not on file  Occupational History  . 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 Smoking status: Former Smoker    Packs/day: 0.50    Years: 15.00    Pack years: 7.50    Types: Cigarettes    Quit date: 11/18/1962    Years since quitting: 56.2  . Smokeless tobacco: Never Used  Substance and Sexual Activity  . Alcohol use: Yes    Alcohol/week: 1.0 standard drinks    Types: 1 Shots of liquor per week    Comment: daily  . Drug use: Never  . Sexual activity: Yes  Other Topics Concern  . Not on file  Social History Narrative   Lives with wife in [place].     Very active physically without significant limitations.    Exercises regularly   Social Determinants of Health   Financial Resource Strain:   . Difficulty of Paying Living Expenses: Not on file  Food Insecurity:   . Worried About Charity fundraiser in the Last Year: Not on file  . Ran Out of Food in the Last Year: Not on file  Transportation Needs:   . Lack of Transportation (Medical): Not on file  . Lack of Transportation (Non-Medical): Not on file  Physical Activity:   . Days of Exercise per Week: Not on file  . Minutes of Exercise per Session: Not on file  Stress:   . Feeling of Stress : Not on file  Social Connections:   . Frequency of Communication with Friends and Family: Not on file  . Frequency of Social Gatherings with Friends and Family: Not on file  . Attends Religious Services: Not on file  . Active Member of Clubs or Organizations: Not on file  . Attends Archivist Meetings: Not on file  . Marital Status: Not on file  Intimate Partner Violence:   . Fear of Current or Ex-Partner: Not on file  . Emotionally Abused: Not on file  . Physically Abused: Not on file  . Sexually Abused: Not on file    Allergies  Allergen Reactions  . Amoxicillin Other (See Comments)    Headache, debilitated  . Bee Venom Anaphylaxis  . Morphine And Related Other (See Comments)    hypotension    Current Outpatient Medications  Medication Sig Dispense Refill  .  allopurinol (ZYLOPRIM) 300 MG tablet Take 150 mg by mouth Daily.    Marland Kitchen amLODipine (NORVASC) 5 MG tablet TAKE 1 TABLET BY MOUTH DAILY 90 tablet 3  . cholecalciferol (VITAMIN D) 1000 UNITS tablet Take 1,000 Units by mouth daily.    Marland Kitchen EPINEPHrine 0.3 mg/0.3 mL IJ SOAJ injection Inject 0.3 mLs (0.3 mg total) into the muscle as needed for anaphylaxis. 2 each 2  . fish oil-omega-3 fatty acids 1000 MG capsule Take 1 g by mouth daily.     . furosemide (LASIX) 40 MG tablet TAKE 1 TABLET BY MOUTH EVERY DAY 90 tablet 2  . galantamine (RAZADYNE ER) 8 MG 24 hr capsule Take 8 mg  by mouth Daily.    . HONEY BEE VENOM IJ Inject as directed See admin instructions. Every eight weeks    . isosorbide mononitrate (IMDUR) 60 MG 24 hr tablet TAKE 1 TABLET(60 MG) BY MOUTH DAILY AFTER SUPPER 30 tablet 2  . NITROSTAT 0.4 MG SL tablet Place 1 tablet under the tongue every 5 (five) minutes x 3 doses as needed for chest pain.     . pantoprazole (PROTONIX) 40 MG tablet Take 40 mg by mouth every other day.   4  . potassium chloride (MICRO-K) 10 MEQ CR capsule Take 1 capsule (10 mEq total) by mouth daily. With Lasix 90 capsule 1  . predniSONE (DELTASONE) 20 MG tablet Take 20 mg by mouth daily.    Marland Kitchen PRESCRIPTION MEDICATION Cpap    . psyllium (METAMUCIL) 58.6 % powder Take 1 packet by mouth daily.    . rivaroxaban (XARELTO) 15 MG TABS tablet Take 1 tablet (15 mg total) by mouth daily with supper. (Patient taking differently: Take 20 mg by mouth daily. ) 30 tablet 1  . telmisartan (MICARDIS) 40 MG tablet Take 20 mg by mouth daily. 1/2 tab by mouth once daily     . triamcinolone cream (KENALOG) 0.1 % Apply 1 application topically as needed (dry skin irritation).     . vitamin B-12 (CYANOCOBALAMIN) 1000 MCG tablet Take 1,000 mcg by mouth daily.     Marland Kitchen ZETIA 10 MG tablet Take 10 mg by mouth Daily.    Marland Kitchen acetaminophen (TYLENOL) 500 MG tablet Take 500 mg by mouth at bedtime.    . DiphenhydrAMINE HCl (BENADRYL ALLERGY PO) Take 25 mg by  mouth as needed.     . Fluorouracil (TOLAK) 4 % CREA Apply 1 application topically at bedtime.    Marland Kitchen omega-3 acid ethyl esters (LOVAZA) 1 g capsule Take 1 g by mouth daily.    . rosuvastatin (CRESTOR) 20 MG tablet Take 10 mg by mouth daily.     No current facility-administered medications for this visit.    ROS:   General:  No weight loss, Fever, chills  HEENT: No recent headaches, no nasal bleeding, no visual changes, no sore throat  Neurologic: No dizziness, blackouts, seizures. No recent symptoms of stroke or mini- stroke. No recent episodes of slurred speech, or temporary blindness.  Cardiac: No recent episodes of chest pain/pressure, no shortness of breath at rest.  + shortness of breath with exertion.  + history of atrial fibrillation or irregular heartbeat  Vascular: No history of rest pain in feet.  No history of claudication.  No history of non-healing ulcer, No history of DVT   Pulmonary: No home oxygen, no productive cough, no hemoptysis,  No asthma or wheezing  Musculoskeletal:  [ ]  Arthritis, [ ]  Low back pain,  [ ]  Joint pain  Hematologic:No history of hypercoagulable state.  No history of easy bleeding.  No history of anemia  Gastrointestinal: No hematochezia or melena,  No gastroesophageal reflux, no trouble swallowing  Urinary: [ ]  chronic Kidney disease, [ ]  on HD - [ ]  MWF or [ ]  TTHS, [ ]  Burning with urination, [ ]  Frequent urination, [ ]  Difficulty urinating;   Skin: No rashes  Psychological: No history of anxiety,  No history of depression   Physical Examination  Vitals:   02/11/19 1347  BP: 137/72  Pulse: 68  Resp: 18  Temp: (!) 96.8 F (36 C)  TempSrc: Temporal  SpO2: 97%  Weight: 241 lb 14.4 oz (109.7 kg)  Height: 5' 11.5" (1.816 m)    Body mass index is 33.27 kg/m.  General:  Alert and oriented, no acute distress HEENT: Normal Neck: No JVD Cardiac: Regular Rate and Rhythm  Skin: No rash Extremity Pulses:  2+ radial, brachial,  femoral, absent dorsalis pedis, posterior tibial pulses bilaterally Musculoskeletal: No deformity trace pretibial edema  Neurologic: Upper and lower extremity motor 5/5 and symmetric  DATA:  I reviewed the patient's recent arterial duplex exam which showed biphasic flow with no low flow-limiting stenosis throughout his arterial tree bilaterally.  His ABI was 0.59 on the right 0.85 on the left.  This test was performed January 30, 2019.  ASSESSMENT: Bilateral leg pain now resolved after course of steroid treatment for what is thought to be polymyalgia rheumatica.  The patient does have evidence of peripheral arterial disease with slightly decreased ABIs as well as atherosclerotic plaque seen on duplex exam although the duplex did show there did not seem to be any significant flow-limiting lesions.  The patient currently does not experience claudication symptoms.  He has no nonhealing wounds.  He does not have rest pain.   PLAN: Patient has mild to moderate peripheral arterial disease.  I would currently consider this asymptomatic as it seems like most of his symptoms have resolved with steroid treatment for polymyalgia rheumatica.  I discussed with the patient and his wife today that he develops symptoms of claudication with pain in his calf with exercise or nonhealing wounds to certainly come back and see me at any point in the future.  Otherwise he will follow up on an as-needed basis.   Ruta Hinds, MD [facility] Office: [phone] Pager: [phone]

## 2019-02-12 ENCOUNTER — Other Ambulatory Visit: Payer: Medicare Other

## 2019-02-16 DIAGNOSIS — M25511 Pain in right shoulder: Secondary | ICD-10-CM | POA: Diagnosis not present

## 2019-02-16 DIAGNOSIS — R531 Weakness: Secondary | ICD-10-CM | POA: Diagnosis not present

## 2019-02-16 DIAGNOSIS — M542 Cervicalgia: Secondary | ICD-10-CM | POA: Diagnosis not present

## 2019-02-16 DIAGNOSIS — R293 Abnormal posture: Secondary | ICD-10-CM | POA: Diagnosis not present

## 2019-02-18 DIAGNOSIS — M25511 Pain in right shoulder: Secondary | ICD-10-CM | POA: Diagnosis not present

## 2019-02-18 DIAGNOSIS — R293 Abnormal posture: Secondary | ICD-10-CM | POA: Diagnosis not present

## 2019-02-18 DIAGNOSIS — R531 Weakness: Secondary | ICD-10-CM | POA: Diagnosis not present

## 2019-02-18 DIAGNOSIS — M542 Cervicalgia: Secondary | ICD-10-CM | POA: Diagnosis not present

## 2019-02-20 DIAGNOSIS — R700 Elevated erythrocyte sedimentation rate: Secondary | ICD-10-CM | POA: Diagnosis not present

## 2019-02-20 DIAGNOSIS — M2550 Pain in unspecified joint: Secondary | ICD-10-CM | POA: Diagnosis not present

## 2019-02-20 DIAGNOSIS — Z6841 Body Mass Index (BMI) 40.0 and over, adult: Secondary | ICD-10-CM | POA: Diagnosis not present

## 2019-02-20 DIAGNOSIS — M353 Polymyalgia rheumatica: Secondary | ICD-10-CM | POA: Diagnosis not present

## 2019-02-24 ENCOUNTER — Ambulatory Visit (INDEPENDENT_AMBULATORY_CARE_PROVIDER_SITE_OTHER): Payer: Medicare Other

## 2019-02-24 ENCOUNTER — Other Ambulatory Visit: Payer: Self-pay

## 2019-02-24 DIAGNOSIS — T63441D Toxic effect of venom of bees, accidental (unintentional), subsequent encounter: Secondary | ICD-10-CM | POA: Diagnosis not present

## 2019-02-25 DIAGNOSIS — M542 Cervicalgia: Secondary | ICD-10-CM | POA: Diagnosis not present

## 2019-02-25 DIAGNOSIS — M25511 Pain in right shoulder: Secondary | ICD-10-CM | POA: Diagnosis not present

## 2019-02-25 DIAGNOSIS — R293 Abnormal posture: Secondary | ICD-10-CM | POA: Diagnosis not present

## 2019-02-25 DIAGNOSIS — R531 Weakness: Secondary | ICD-10-CM | POA: Diagnosis not present

## 2019-02-26 DIAGNOSIS — D6859 Other primary thrombophilia: Secondary | ICD-10-CM | POA: Insufficient documentation

## 2019-02-27 DIAGNOSIS — M542 Cervicalgia: Secondary | ICD-10-CM | POA: Diagnosis not present

## 2019-02-27 DIAGNOSIS — R531 Weakness: Secondary | ICD-10-CM | POA: Diagnosis not present

## 2019-02-27 DIAGNOSIS — M25511 Pain in right shoulder: Secondary | ICD-10-CM | POA: Diagnosis not present

## 2019-02-27 DIAGNOSIS — R293 Abnormal posture: Secondary | ICD-10-CM | POA: Diagnosis not present

## 2019-03-02 DIAGNOSIS — R293 Abnormal posture: Secondary | ICD-10-CM | POA: Diagnosis not present

## 2019-03-02 DIAGNOSIS — M542 Cervicalgia: Secondary | ICD-10-CM | POA: Diagnosis not present

## 2019-03-02 DIAGNOSIS — M25511 Pain in right shoulder: Secondary | ICD-10-CM | POA: Diagnosis not present

## 2019-03-02 DIAGNOSIS — R531 Weakness: Secondary | ICD-10-CM | POA: Diagnosis not present

## 2019-03-04 DIAGNOSIS — M25511 Pain in right shoulder: Secondary | ICD-10-CM | POA: Diagnosis not present

## 2019-03-04 DIAGNOSIS — M542 Cervicalgia: Secondary | ICD-10-CM | POA: Diagnosis not present

## 2019-03-04 DIAGNOSIS — R531 Weakness: Secondary | ICD-10-CM | POA: Diagnosis not present

## 2019-03-04 DIAGNOSIS — R293 Abnormal posture: Secondary | ICD-10-CM | POA: Diagnosis not present

## 2019-03-05 ENCOUNTER — Other Ambulatory Visit: Payer: Self-pay

## 2019-03-05 ENCOUNTER — Ambulatory Visit: Payer: Medicare Other

## 2019-03-05 DIAGNOSIS — I350 Nonrheumatic aortic (valve) stenosis: Secondary | ICD-10-CM

## 2019-03-05 DIAGNOSIS — I7121 Aneurysm of the ascending aorta, without rupture: Secondary | ICD-10-CM

## 2019-03-05 DIAGNOSIS — I712 Thoracic aortic aneurysm, without rupture: Secondary | ICD-10-CM

## 2019-03-09 DIAGNOSIS — M542 Cervicalgia: Secondary | ICD-10-CM | POA: Diagnosis not present

## 2019-03-09 DIAGNOSIS — R293 Abnormal posture: Secondary | ICD-10-CM | POA: Diagnosis not present

## 2019-03-09 DIAGNOSIS — M25511 Pain in right shoulder: Secondary | ICD-10-CM | POA: Diagnosis not present

## 2019-03-09 DIAGNOSIS — R531 Weakness: Secondary | ICD-10-CM | POA: Diagnosis not present

## 2019-03-09 NOTE — Progress Notes (Signed)
AS has progressed as well as new mild LV systolic dysfunction. Pulmonary Dr. Halford Chessman "Dyspnea. - recent CXR and PFT unrevealing for pulmonary cause of his symptoms - likely from cardiac disease and deconditioning with obesity - he has cardiology follow up with Dr.Jaideep Pollack"  Will discuss with patient on his visit

## 2019-03-11 DIAGNOSIS — I359 Nonrheumatic aortic valve disorder, unspecified: Secondary | ICD-10-CM | POA: Insufficient documentation

## 2019-03-11 DIAGNOSIS — I5020 Unspecified systolic (congestive) heart failure: Secondary | ICD-10-CM | POA: Insufficient documentation

## 2019-03-12 DIAGNOSIS — R293 Abnormal posture: Secondary | ICD-10-CM | POA: Diagnosis not present

## 2019-03-12 DIAGNOSIS — M542 Cervicalgia: Secondary | ICD-10-CM | POA: Diagnosis not present

## 2019-03-12 DIAGNOSIS — M25511 Pain in right shoulder: Secondary | ICD-10-CM | POA: Diagnosis not present

## 2019-03-12 DIAGNOSIS — R531 Weakness: Secondary | ICD-10-CM | POA: Diagnosis not present

## 2019-03-13 DIAGNOSIS — M353 Polymyalgia rheumatica: Secondary | ICD-10-CM | POA: Diagnosis not present

## 2019-03-13 DIAGNOSIS — R700 Elevated erythrocyte sedimentation rate: Secondary | ICD-10-CM | POA: Diagnosis not present

## 2019-03-13 DIAGNOSIS — M2550 Pain in unspecified joint: Secondary | ICD-10-CM | POA: Diagnosis not present

## 2019-03-16 DIAGNOSIS — R293 Abnormal posture: Secondary | ICD-10-CM | POA: Diagnosis not present

## 2019-03-16 DIAGNOSIS — R531 Weakness: Secondary | ICD-10-CM | POA: Diagnosis not present

## 2019-03-16 DIAGNOSIS — M542 Cervicalgia: Secondary | ICD-10-CM | POA: Diagnosis not present

## 2019-03-16 DIAGNOSIS — M25511 Pain in right shoulder: Secondary | ICD-10-CM | POA: Diagnosis not present

## 2019-03-19 DIAGNOSIS — M542 Cervicalgia: Secondary | ICD-10-CM | POA: Diagnosis not present

## 2019-03-19 DIAGNOSIS — R531 Weakness: Secondary | ICD-10-CM | POA: Diagnosis not present

## 2019-03-19 DIAGNOSIS — M25511 Pain in right shoulder: Secondary | ICD-10-CM | POA: Diagnosis not present

## 2019-03-19 DIAGNOSIS — R293 Abnormal posture: Secondary | ICD-10-CM | POA: Diagnosis not present

## 2019-03-20 ENCOUNTER — Ambulatory Visit: Payer: Medicare Other | Admitting: Cardiology

## 2019-03-20 ENCOUNTER — Encounter: Payer: Self-pay | Admitting: Cardiology

## 2019-03-20 ENCOUNTER — Other Ambulatory Visit: Payer: Self-pay

## 2019-03-20 VITALS — BP 128/67 | HR 72 | Temp 96.6°F | Ht 73.0 in | Wt 239.0 lb

## 2019-03-20 DIAGNOSIS — I350 Nonrheumatic aortic (valve) stenosis: Secondary | ICD-10-CM | POA: Diagnosis not present

## 2019-03-20 DIAGNOSIS — I480 Paroxysmal atrial fibrillation: Secondary | ICD-10-CM | POA: Diagnosis not present

## 2019-03-20 DIAGNOSIS — I7121 Aneurysm of the ascending aorta, without rupture: Secondary | ICD-10-CM

## 2019-03-20 DIAGNOSIS — I25118 Atherosclerotic heart disease of native coronary artery with other forms of angina pectoris: Secondary | ICD-10-CM | POA: Diagnosis not present

## 2019-03-20 DIAGNOSIS — Z951 Presence of aortocoronary bypass graft: Secondary | ICD-10-CM

## 2019-03-20 DIAGNOSIS — I712 Thoracic aortic aneurysm, without rupture: Secondary | ICD-10-CM

## 2019-03-20 DIAGNOSIS — E7800 Pure hypercholesterolemia, unspecified: Secondary | ICD-10-CM

## 2019-03-20 MED ORDER — ROSUVASTATIN CALCIUM 10 MG PO TABS: 10.0000 mg | ORAL_TABLET | Freq: Every day | ORAL | 3 refills | Status: DC

## 2019-03-20 NOTE — Progress Notes (Signed)
Primary Physician/Referring:  [name] Infante, MD  Patient ID: [name] [name], male    DOB: 02-15-1926, 86 y.o.   MRN: [id number]  Chief Complaint  Patient presents with  . Aortic Stenosis  . Follow-up    6 month  . Results    echo    HPI:    [name] [name]  is a 86 y.o. Caucasian male with vitamin B12 deficiency, mild dementia, hyperlipidemia, hypertension, CAD, chronic stable angina status post CABG x 3 and Maze and LAA amputation in 2014, paroxysmal atrial fibrillation, obstructive sleep apnea not currently on CPAP, moderate aortic stenosis with thoracic aortic aneurysm and dilation of ascending aorta and follows Dr. Lilly [name] (No correlation with TTE with regards to aneurysm), venous stasis edema.  He now presents for a 6 month visit of his cardiac issues, last ED visit for   atrial fibrillation with RVR on 09/11/2018 S/P DCCV. On f/u office visit the following day had revealed marked sinus bradycardia and episodes of sinus arrest hence I discontinued the metoprolol.   Since last office visit he has been diagnosed with polymyalgia rheumatica and was started on steroids with excellent response, he now has no significant aches and pains, has increased his physical activity, denies chest pain or shortness of breath.  Chronic leg edema remained stable.  Accompanied by his wife [name] [name].  Past Medical History:  Diagnosis Date  . AAA (abdominal aortic aneurysm) ([place])   . Acute coronary syndrome ([place]) 02/29/2012  . Acute systolic heart failure ([place]) 03/03/2012  . Angina   . Ascending aorta dilatation ([facility]) 03/13/2012   Fusiform dilatation of the ascending thoracic aorta discovered during surgery, max diameter 4.2-4.3 cm  . Atrial fibrillation ([place]) 02/29/2012   Recurrent paroxysmal, new-onset  . BPH (benign prostatic hypertrophy)   . CAD (coronary artery disease)    Cath April 2012 40% left main, 50% LAD, 40% OM, occluded RCA with L-R collaterals   . CHF (congestive heart failure) ([place])   . Chronic  venous insufficiency   . Dyspnea   . Glaucoma   . Gout   . Hyperlipidemia   . Hypertension   . Kidney stones   . MI (myocardial infarction) ([place])   . Obesity (BMI 30-39.9)   . Paroxysmal atrial fibrillation (HCC) 02/29/2012   Recurrent paroxysmal, new-onset   . PMR (polymyalgia rheumatica) ([facility])   . S/P CABG x 3 03/06/2012   LIMA to LAD, SVG to OM, SVG to RCA, EVH via right thigh  . S/P Maze operation for atrial fibrillation 03/06/2012   Complete bilateral lesions set using bipolar radiofrequency and cryothermy ablation with clipping of LA appendage   Past Surgical History:  Procedure Laterality Date  . CARDIAC CATHETERIZATION    . CATARACT EXTRACTION, BILATERAL    . CHOLECYSTECTOMY N/A 03/24/2012   Procedure: LAPAROSCOPIC CHOLECYSTECTOMY WITH INTRAOPERATIVE CHOLANGIOGRAM;  Surgeon: [name] Jarred, MD;  Location: [facility];  Service: General;  Laterality: N/A;  . CORONARY ARTERY BYPASS GRAFT N/A 03/06/2012   Procedure: CORONARY ARTERY BYPASS GRAFTING (CABG);  Surgeon: [name] Alberts, MD;  Location: [facility];  Service: Open Heart Surgery;  Laterality: N/A;  . ENDOVEIN HARVEST OF GREATER SAPHENOUS VEIN Right 03/06/2012   Procedure: ENDOVEIN HARVEST OF GREATER SAPHENOUS VEIN;  Surgeon: [name] Alberts, MD;  Location: [facility];  Service: Open Heart Surgery;  Laterality: Right;  . INTRAOPERATIVE TRANSESOPHAGEAL ECHOCARDIOGRAM N/A 03/06/2012   Procedure: INTRAOPERATIVE TRANSESOPHAGEAL ECHOCARDIOGRAM;  Surgeon: [name] Alberts, MD;  Location: [facility];  Service:  Open Heart Surgery;  Laterality: N/A;  . LEFT HEART CATH Right 03/02/2012   Procedure: LEFT HEART CATH;  Surgeon: [name] Mocha, MD;  Location: [facility] CATH LAB;  Service: Cardiovascular;  Laterality: Right;  Marland Kitchen MAZE N/A 03/06/2012   Procedure: MAZE;  Surgeon: [name] Alberts, MD;  Location: [facility];  Service: Open Heart Surgery;  Laterality: N/A;  . PROSTATECTOMY     Family History  Problem Relation Age of Onset  . Dementia Mother   . Stroke Brother       Social History   Tobacco Use  . Smoking status: Former Smoker    Packs/day: 0.50    Years: 15.00    Pack years: 7.50    Types: Cigarettes    Quit date: 11/18/1962    Years since quitting: 56.3  . Smokeless tobacco: Never Used  Substance Use Topics  . Alcohol use: Yes    Alcohol/week: 1.0 standard drinks    Types: 1 Shots of liquor per week    Comment: daily   ROS  Review of Systems  Cardiovascular: Positive for leg swelling (chronic). Negative for chest pain and dyspnea on exertion.  Musculoskeletal: Positive for arthritis (mild and stable), joint pain and myalgias (recent diagnosis of PMR).  Gastrointestinal: Negative for melena.   Objective  Blood pressure 128/67, pulse 72, temperature (!) 96.6 F (35.9 C), height 6\' 1"  (1.854 m), weight 239 lb (108.4 kg), SpO2 97 %.  Vitals with BMI 03/20/2019 02/11/2019 10/16/2018  Height 6\' 1"  5' 11.5" -  Weight 239 lbs 241 lbs 14 oz -  BMI 16.10 96.04 -  Systolic 540 981 191  Diastolic 67 72 66  Pulse 72 68 65     Physical Exam  Constitutional: He appears well-developed and well-nourished. No distress.  Cardiovascular: Normal rate and regular rhythm. Exam reveals no gallop.  Murmur heard.  Scratchy midsystolic murmur is present with a grade of 3/6 at the upper right sternal border. Pulses:      Carotid pulses are 2+ on the right side and 2+ on the left side.      Radial pulses are 2+ on the right side and 2+ on the left side.       Femoral pulses are 2+ on the right side and 2+ on the left side.      Popliteal pulses are 2+ on the right side and 2+ on the left side.       Dorsalis pedis pulses are 0 on the right side and 1+ on the left side.       Posterior tibial pulses are 0 on the right side and 0 on the left side.  Vascular examination reveals bilateral pigmented lower extremities with varicose veins.  2+ pitting edema. No JVD.   Pulmonary/Chest: Effort normal and breath sounds normal.  Abdominal: Soft. Bowel sounds are  normal.   Laboratory examination:   Recent Labs    08/29/18 1218 09/11/18 1330  NA 140 139  K 4.0 4.1  CL 102 103  CO2 30 24  GLUCOSE 101* 119*  BUN 23 20  CREATININE 1.34 1.39*  CALCIUM 10.0 9.9  GFRNONAA  --  44*  GFRAA  --  51*   CrCl cannot be calculated (Patient's most recent lab result is older than the maximum 21 days allowed.).  CMP Latest Ref Rng & Units 09/11/2018 08/29/2018 04/22/2015  Glucose 70 - 99 mg/dL 119(H) 101(H) -  BUN 8 - 23 mg/dL 20 23 -  Creatinine 0.61 - 1.24  mg/dL 1.39(H) 1.34 1.1  Sodium 135 - 145 mmol/L 139 140 -  Potassium 3.5 - 5.1 mmol/L 4.1 4.0 -  Chloride 98 - 111 mmol/L 103 102 -  CO2 22 - 32 mmol/L 24 30 -  Calcium 8.9 - 10.3 mg/dL 9.9 10.0 -  Total Protein 6.5 - 8.1 g/dL 7.2 6.9 -  Total Bilirubin 0.3 - 1.2 mg/dL 0.7 0.6 -  Alkaline Phos 38 - 126 U/L 47 43 -  AST 15 - 41 U/L 27 18 -  ALT 0 - 44 U/L 18 13 -   CBC Latest Ref Rng & Units 09/11/2018 08/29/2018 03/08/2015  WBC 4.0 - 10.5 K/uL 5.8 4.0 6.2  Hemoglobin 13.0 - 17.0 g/dL 12.5(L) 11.6(L) 12.6(L)  Hematocrit 39.0 - 52.0 % 38.3(L) 34.2(L) 37.2(L)  Platelets 150 - 400 K/uL 182 184.0 167   TSH Recent Labs    09/11/18 1420  TSH 1.797    External labs:   Cholesterol, total 115.000 m 01/19/2019 HDL 47 MG/DL 01/19/2019 LDL 51.000 mg 01/19/2019 Triglycerides 84.000 01/19/2019  A1C 5.600 % 01/19/2019; TSH 2.320 01/19/2019  Hemoglobin 10.900 g/ 02/06/2019; INR 1.900 09/11/2018 Platelets 182.000 09/11/2018  Creatinine, Serum 1.300 mg/ 02/06/2019 Potassium 4.100 09/11/2018 Magnesium N/D ALT (SGPT) 15.000 uni 01/19/2019  Medications and allergies   Allergies  Allergen Reactions  . Amoxicillin Other (See Comments)    Headache, debilitated  . Bee Venom Anaphylaxis  . Morphine And Related Other (See Comments)    hypotension     Current Outpatient Medications  Medication Instructions  . acetaminophen (TYLENOL) 500 mg, Oral, Daily at bedtime  . allopurinol (ZYLOPRIM) 150 mg, Daily  .  amLODipine (NORVASC) 5 MG tablet TAKE 1 TABLET BY MOUTH DAILY  . cholecalciferol (VITAMIN D) 1,000 Units, Daily  . DiphenhydrAMINE HCl (BENADRYL ALLERGY PO) 25 mg, As needed  . EPINEPHrine (EPI-PEN) 0.3 mg, Intramuscular, As needed  . fish oil-omega-3 fatty acids 1 g, Daily  . Fluorouracil (TOLAK) 4 % CREA 1 application, Topical, Daily at bedtime  . furosemide (LASIX) 40 MG tablet TAKE 1 TABLET BY MOUTH EVERY DAY  . galantamine (RAZADYNE ER) 8 mg, Daily  . HONEY BEE VENOM IJ Injection, See admin instructions, Every eight weeks  . isosorbide mononitrate (IMDUR) 60 MG 24 hr tablet TAKE 1 TABLET(60 MG) BY MOUTH DAILY AFTER SUPPER  . NITROSTAT 0.4 MG SL tablet 1 tablet, Every 5 min x3 PRN  . omega-3 acid ethyl esters (LOVAZA) 1 g, Oral, Daily  . pantoprazole (PROTONIX) 40 mg, Oral, Every other day  . potassium chloride (MICRO-K) 10 MEQ CR capsule 10 mEq, Oral, Daily, With Lasix  . predniSONE (DELTASONE) 10 mg, Oral, Daily  . PRESCRIPTION MEDICATION Cpap   . psyllium (METAMUCIL) 58.6 % powder 1 packet, Oral, Daily  . Rivaroxaban (XARELTO) 15 mg, Oral, Daily with supper  . rosuvastatin (CRESTOR) 10 mg, Oral, Daily  . telmisartan (MICARDIS) 20 mg, Oral, Daily, 1/2 tab by mouth once daily  . triamcinolone cream (KENALOG) 0.1 % 1 application, As needed  . vitamin B-12 (CYANOCOBALAMIN) 1,000 mcg, Daily  . Zetia 10 mg, Daily   Radiology:   No results found.  Cardiac Studies:   Coronary angiogram 03/02/2012: 50-60% left main, 99% LAD, 40% OM, occluded RCA with L-R collaterals  Nuclear stress test   11/22/2017: 1. Lexiscan stress test with low level exercise was performed. Exercise capacity was not assessed. Stress symptoms included dyspnea. Peak effect blood pressure was 114/66 mmHg. The resting and stress electrocardiogram demonstrated normal sinus rhythm,  RBBB, no resting arrhythmias and normal rest repolarization. Stress EKG is non diagnostic for ischemia as it is a pharmacologic stress. 2.  The overall quality of the study is good. Left ventricular cavity is noted to be mildly enlarged on the rest and stress studies. Gated SPECT imaging demonstrated mild hypokinesis of the basal inferolateral and mid inferolateral myocardial wall(s). The left ventricular ejection fraction was calculated or visually estimated to be 69%. Medium sized, medium intensity, fixed perfusion defect in mid to basal inferolateral myocardium suggests old LCx territory infarct with no periinfarct ischemia. 3. Low risk study.   ABI 01/30/2019: Right: Resting right ankle-brachial index indicates moderate right lower extremity arterial disease. The right toe-brachial index is abnormal.  Left: Resting left ankle-brachial index indicates mild left lower extremity arterial disease. The left toe-brachial index is abnormal.   Echocardiogram 03/05/2019:  Mildly depressed LV systolic function with visual EF 45-50%. Mild left  ventricular hypertrophy. Mild global hypokinesis. No obvious regional wall  motion abnormalities. Left ventricle cavity is normal in size. Elevated  LAP. Calculated EF 46%.  Trileaflet aortic valve. Moderate aortic stenosis. Mild (Grade I) aortic  regurgitation. Thickened and calcified aortic valve leaflets with reduced cusp separation.  Peak velocity 3.13m/s, mean gradient 29mmHg, Dimensional  index 0.24, AVA 1.0cm2, findings suggestive of moderate to severe aortic  stenosis.  Mild (Grade I) mitral regurgitation.  Mild tricuspid regurgitation.  Mild pulmonic regurgitation.  IVC is dilated with a respiratory response of >50%.  Prior study dated 11/2017: LVEF 56%, mild AR, mild AS (PG 58mmHG, MG  94mmHG, AVA 1.26 cm) mild pulmonary hypertension RVSP 83mmHG, remaining valvular heart disease remains stable in comparison.  EKG  EKG 03/20/2019: Normal sinus rhythm with rate of 61 bpm (probably sinus) , left axis deviation, left intrafascicular block.  Right bundle branch block.  No evidence of ischemia.  No significant change from  EKG 09/22/2018: Probably ectopic atrial rhythm, EKG 08/11/2018.   Assessment     ICD-10-CM   1. Paroxysmal atrial fibrillation ([place]). CHA2DS2-VASc Score is 4.  Yearly risk of stroke: 4% (A, HTN, Vasc Dz)  I48.0 EKG 12-Lead  2. Coronary artery disease of native artery of native heart with stable angina pectoris ([place])  I25.118   3. S/P CABG x 3 and modified Maze procedure and LAA appendage ligatation 2014  Z95.1   4. Moderate aortic stenosis  I35.0   5. Thoracic ascending aortic aneurysm (H[facility])  I71.2   6. Hypercholesteremia  E78.00 rosuvastatin (CRESTOR) 10 MG tablet     Meds ordered this encounter  Medications  . rosuvastatin (CRESTOR) 10 MG tablet    Sig: Take 1 tablet (10 mg total) by mouth daily.    Dispense:  90 tablet    Refill:  3    Medications Discontinued During This Encounter  Medication Reason  . rosuvastatin (CRESTOR) 20 MG tablet Reorder    Recommendations:   [name] [name]  is a 86 y.o. Caucasian male with vitamin B12 deficiency, mild dementia, hyperlipidemia, hypertension, CAD, chronic stable angina status post CABG x 3 and Maze and LAA amputation in 2014, paroxysmal atrial fibrillation, obstructive sleep apnea not currently on CPAP, moderate aortic stenosis with thoracic aortic aneurysm and dilation of ascending aorta and follows Dr. Lilly [name] (No correlation with TTE with regards to aneurysm), venous stasis edema.  Patient presents here for 42-month visit, last office visit he had complained of marked fatigue, generalized aches and pains, since then he has been diagnosed with polymyalgia rheumatica and has been  started on steroids with excellent response and immediate response.  I do not think his symptoms are related to statins, hence I will restart back Crestor 10 mg daily that he was on previously, continue Zetia.  His lipids are very well controlled.  With regard to coronary artery disease he has remained stable, he is not on a beta-blocker  due to marked bradycardia.  Aortic valve stenosis is only mild to moderate at most there is no clinical evidence of heart failure.  Blood pressure is also well controlled.  I like to see him back in 6 months for follow-up.  Thoracic aortic aneurysm is being managed by Dr. Roxy Manns.    I reviewed his external medical records including vascular surgical response with regard to PAD, updated his labs, discussed all the differential diagnosis with the patient and his wife.  Adrian Prows, MD, [facility] 03/20/2019, 11:34 AM [facility] [place] Office: [phone]

## 2019-03-23 DIAGNOSIS — M25511 Pain in right shoulder: Secondary | ICD-10-CM | POA: Diagnosis not present

## 2019-03-23 DIAGNOSIS — R293 Abnormal posture: Secondary | ICD-10-CM | POA: Diagnosis not present

## 2019-03-23 DIAGNOSIS — M542 Cervicalgia: Secondary | ICD-10-CM | POA: Diagnosis not present

## 2019-03-23 DIAGNOSIS — R531 Weakness: Secondary | ICD-10-CM | POA: Diagnosis not present

## 2019-03-25 ENCOUNTER — Ambulatory Visit: Payer: Medicare Other | Admitting: Cardiology

## 2019-03-26 DIAGNOSIS — R531 Weakness: Secondary | ICD-10-CM | POA: Diagnosis not present

## 2019-03-26 DIAGNOSIS — M25511 Pain in right shoulder: Secondary | ICD-10-CM | POA: Diagnosis not present

## 2019-03-26 DIAGNOSIS — R293 Abnormal posture: Secondary | ICD-10-CM | POA: Diagnosis not present

## 2019-03-26 DIAGNOSIS — M542 Cervicalgia: Secondary | ICD-10-CM | POA: Diagnosis not present

## 2019-03-30 DIAGNOSIS — M25511 Pain in right shoulder: Secondary | ICD-10-CM | POA: Diagnosis not present

## 2019-03-30 DIAGNOSIS — R531 Weakness: Secondary | ICD-10-CM | POA: Diagnosis not present

## 2019-03-30 DIAGNOSIS — R293 Abnormal posture: Secondary | ICD-10-CM | POA: Diagnosis not present

## 2019-03-30 DIAGNOSIS — M542 Cervicalgia: Secondary | ICD-10-CM | POA: Diagnosis not present

## 2019-04-13 DIAGNOSIS — R293 Abnormal posture: Secondary | ICD-10-CM | POA: Diagnosis not present

## 2019-04-13 DIAGNOSIS — R531 Weakness: Secondary | ICD-10-CM | POA: Diagnosis not present

## 2019-04-13 DIAGNOSIS — G4733 Obstructive sleep apnea (adult) (pediatric): Secondary | ICD-10-CM | POA: Diagnosis not present

## 2019-04-13 DIAGNOSIS — M25511 Pain in right shoulder: Secondary | ICD-10-CM | POA: Diagnosis not present

## 2019-04-13 DIAGNOSIS — I259 Chronic ischemic heart disease, unspecified: Secondary | ICD-10-CM | POA: Diagnosis not present

## 2019-04-13 DIAGNOSIS — M542 Cervicalgia: Secondary | ICD-10-CM | POA: Diagnosis not present

## 2019-04-14 DIAGNOSIS — I259 Chronic ischemic heart disease, unspecified: Secondary | ICD-10-CM | POA: Diagnosis not present

## 2019-04-14 DIAGNOSIS — G4733 Obstructive sleep apnea (adult) (pediatric): Secondary | ICD-10-CM | POA: Diagnosis not present

## 2019-04-16 DIAGNOSIS — M25511 Pain in right shoulder: Secondary | ICD-10-CM | POA: Diagnosis not present

## 2019-04-16 DIAGNOSIS — M542 Cervicalgia: Secondary | ICD-10-CM | POA: Diagnosis not present

## 2019-04-16 DIAGNOSIS — R293 Abnormal posture: Secondary | ICD-10-CM | POA: Diagnosis not present

## 2019-04-16 DIAGNOSIS — R531 Weakness: Secondary | ICD-10-CM | POA: Diagnosis not present

## 2019-04-17 ENCOUNTER — Other Ambulatory Visit: Payer: Self-pay | Admitting: Cardiology

## 2019-04-20 DIAGNOSIS — M542 Cervicalgia: Secondary | ICD-10-CM | POA: Diagnosis not present

## 2019-04-20 DIAGNOSIS — R531 Weakness: Secondary | ICD-10-CM | POA: Diagnosis not present

## 2019-04-20 DIAGNOSIS — M25511 Pain in right shoulder: Secondary | ICD-10-CM | POA: Diagnosis not present

## 2019-04-20 DIAGNOSIS — R293 Abnormal posture: Secondary | ICD-10-CM | POA: Diagnosis not present

## 2019-04-21 ENCOUNTER — Ambulatory Visit (INDEPENDENT_AMBULATORY_CARE_PROVIDER_SITE_OTHER): Payer: Medicare Other

## 2019-04-21 DIAGNOSIS — T63441D Toxic effect of venom of bees, accidental (unintentional), subsequent encounter: Secondary | ICD-10-CM | POA: Diagnosis not present

## 2019-04-23 DIAGNOSIS — M25511 Pain in right shoulder: Secondary | ICD-10-CM | POA: Diagnosis not present

## 2019-04-23 DIAGNOSIS — M542 Cervicalgia: Secondary | ICD-10-CM | POA: Diagnosis not present

## 2019-04-23 DIAGNOSIS — R293 Abnormal posture: Secondary | ICD-10-CM | POA: Diagnosis not present

## 2019-04-23 DIAGNOSIS — R531 Weakness: Secondary | ICD-10-CM | POA: Diagnosis not present

## 2019-04-27 DIAGNOSIS — E785 Hyperlipidemia, unspecified: Secondary | ICD-10-CM | POA: Diagnosis not present

## 2019-04-27 DIAGNOSIS — R700 Elevated erythrocyte sedimentation rate: Secondary | ICD-10-CM | POA: Diagnosis not present

## 2019-04-27 DIAGNOSIS — M353 Polymyalgia rheumatica: Secondary | ICD-10-CM | POA: Diagnosis not present

## 2019-04-27 DIAGNOSIS — M2550 Pain in unspecified joint: Secondary | ICD-10-CM | POA: Diagnosis not present

## 2019-04-29 DIAGNOSIS — L821 Other seborrheic keratosis: Secondary | ICD-10-CM | POA: Diagnosis not present

## 2019-04-29 DIAGNOSIS — L905 Scar conditions and fibrosis of skin: Secondary | ICD-10-CM | POA: Diagnosis not present

## 2019-04-29 DIAGNOSIS — D229 Melanocytic nevi, unspecified: Secondary | ICD-10-CM | POA: Diagnosis not present

## 2019-04-29 DIAGNOSIS — L814 Other melanin hyperpigmentation: Secondary | ICD-10-CM | POA: Diagnosis not present

## 2019-05-28 DIAGNOSIS — I350 Nonrheumatic aortic (valve) stenosis: Secondary | ICD-10-CM | POA: Diagnosis not present

## 2019-05-28 DIAGNOSIS — M791 Myalgia, unspecified site: Secondary | ICD-10-CM | POA: Diagnosis not present

## 2019-05-28 DIAGNOSIS — I5020 Unspecified systolic (congestive) heart failure: Secondary | ICD-10-CM | POA: Diagnosis not present

## 2019-05-28 DIAGNOSIS — D6869 Other thrombophilia: Secondary | ICD-10-CM | POA: Diagnosis not present

## 2019-06-16 ENCOUNTER — Other Ambulatory Visit: Payer: Self-pay

## 2019-06-16 ENCOUNTER — Ambulatory Visit (INDEPENDENT_AMBULATORY_CARE_PROVIDER_SITE_OTHER): Payer: Medicare Other

## 2019-06-16 DIAGNOSIS — T63441D Toxic effect of venom of bees, accidental (unintentional), subsequent encounter: Secondary | ICD-10-CM

## 2019-06-30 ENCOUNTER — Other Ambulatory Visit: Payer: Self-pay

## 2019-06-30 DIAGNOSIS — E7800 Pure hypercholesterolemia, unspecified: Secondary | ICD-10-CM

## 2019-07-03 DIAGNOSIS — H02834 Dermatochalasis of left upper eyelid: Secondary | ICD-10-CM | POA: Diagnosis not present

## 2019-07-03 DIAGNOSIS — H02831 Dermatochalasis of right upper eyelid: Secondary | ICD-10-CM | POA: Diagnosis not present

## 2019-07-03 DIAGNOSIS — H538 Other visual disturbances: Secondary | ICD-10-CM | POA: Diagnosis not present

## 2019-07-03 DIAGNOSIS — H40013 Open angle with borderline findings, low risk, bilateral: Secondary | ICD-10-CM | POA: Diagnosis not present

## 2019-07-14 ENCOUNTER — Telehealth: Payer: Self-pay

## 2019-07-14 NOTE — Telephone Encounter (Signed)
I received a fax from pt's pharmacy questioning pt's rosuvastatin. I called the patient he has not been taking his rosuvastatin.should he be taking it?

## 2019-07-14 NOTE — Telephone Encounter (Signed)
Yes 10 mg daily

## 2019-07-20 ENCOUNTER — Other Ambulatory Visit: Payer: Self-pay | Admitting: Cardiology

## 2019-07-27 DIAGNOSIS — M2550 Pain in unspecified joint: Secondary | ICD-10-CM | POA: Diagnosis not present

## 2019-07-27 DIAGNOSIS — R700 Elevated erythrocyte sedimentation rate: Secondary | ICD-10-CM | POA: Diagnosis not present

## 2019-07-27 DIAGNOSIS — M353 Polymyalgia rheumatica: Secondary | ICD-10-CM | POA: Diagnosis not present

## 2019-07-27 DIAGNOSIS — H538 Other visual disturbances: Secondary | ICD-10-CM | POA: Diagnosis not present

## 2019-08-02 ENCOUNTER — Other Ambulatory Visit: Payer: Self-pay | Admitting: Cardiology

## 2019-08-08 ENCOUNTER — Other Ambulatory Visit: Payer: Self-pay | Admitting: Cardiology

## 2019-08-11 ENCOUNTER — Ambulatory Visit: Payer: Self-pay

## 2019-08-17 ENCOUNTER — Other Ambulatory Visit: Payer: Self-pay

## 2019-08-17 ENCOUNTER — Ambulatory Visit (INDEPENDENT_AMBULATORY_CARE_PROVIDER_SITE_OTHER): Payer: Medicare Other

## 2019-08-17 DIAGNOSIS — T63441D Toxic effect of venom of bees, accidental (unintentional), subsequent encounter: Secondary | ICD-10-CM

## 2019-09-19 ENCOUNTER — Other Ambulatory Visit: Payer: Self-pay | Admitting: Cardiology

## 2019-09-23 ENCOUNTER — Ambulatory Visit (INDEPENDENT_AMBULATORY_CARE_PROVIDER_SITE_OTHER): Payer: Medicare Other | Admitting: Cardiology

## 2019-09-23 ENCOUNTER — Other Ambulatory Visit: Payer: Self-pay

## 2019-09-23 ENCOUNTER — Encounter: Payer: Self-pay | Admitting: Cardiology

## 2019-09-23 VITALS — BP 135/60 | HR 58 | Resp 15 | Ht 73.0 in | Wt 240.0 lb

## 2019-09-23 DIAGNOSIS — Z951 Presence of aortocoronary bypass graft: Secondary | ICD-10-CM

## 2019-09-23 DIAGNOSIS — I350 Nonrheumatic aortic (valve) stenosis: Secondary | ICD-10-CM

## 2019-09-23 DIAGNOSIS — I25118 Atherosclerotic heart disease of native coronary artery with other forms of angina pectoris: Secondary | ICD-10-CM

## 2019-09-23 DIAGNOSIS — R0989 Other specified symptoms and signs involving the circulatory and respiratory systems: Secondary | ICD-10-CM

## 2019-09-23 DIAGNOSIS — R0609 Other forms of dyspnea: Secondary | ICD-10-CM

## 2019-09-23 DIAGNOSIS — Z8679 Personal history of other diseases of the circulatory system: Secondary | ICD-10-CM

## 2019-09-23 DIAGNOSIS — R0600 Dyspnea, unspecified: Secondary | ICD-10-CM

## 2019-09-23 DIAGNOSIS — I10 Essential (primary) hypertension: Secondary | ICD-10-CM | POA: Diagnosis not present

## 2019-09-23 DIAGNOSIS — Z9889 Other specified postprocedural states: Secondary | ICD-10-CM | POA: Diagnosis not present

## 2019-09-23 NOTE — H&P (View-Only) (Signed)
Primary Physician/Referring:  [name], MD  Patient ID: [name] [name], male    DOB: 1926/09/24, 86 y.o.   MRN: [id number]  Chief Complaint  Patient presents with  . Follow-up    6 month  . Coronary Artery Disease  . Hyperlipidemia   HPI:    [name] [name]  is a 86 y.o. Caucasian male with vitamin B12 deficiency, mild dementia, hyperlipidemia, hypertension, CAD, chronic stable angina status post CABG x 3 and Maze and LAA amputation in 2014, paroxysmal atrial fibrillation, obstructive sleep apnea not currently on CPAP, moderate aortic stenosis with thoracic aortic aneurysm and dilation of ascending aorta and follows Dr. Lilly Cove (No correlation with TTE with regards to aneurysm), venous stasis edema.  Last ED visit for   atrial fibrillation with RVR on 09/11/2018 S/P DCCV. On f/u office visit the following day had revealed marked sinus bradycardia and episodes of sinus arrest hence I discontinued the metoprolol.   Since last office visit he has been diagnosed with polymyalgia rheumatica and was started on steroids with excellent response.  This is 51-monthoffice visit, he has noted marked dyspnea on exertion even doing activities of daily living.  No PND or orthopnea.  Chronic leg edema has remained stable.  Wife requests a walker and also parking sticker.  He is accompanied by his wife SManuela Schwartz  Past Medical History:  Diagnosis Date  . AAA (abdominal aortic aneurysm) ([facility]   . [facility] 02/29/2012  . Acute systolic heart failure ([facility] 03/03/2012  . Angina   . Ascending aorta dilatation (HCC) 03/13/2012   Fusiform dilatation of the ascending thoracic aorta discovered during surgery, max diameter 4.2-4.3 cm  . Atrial fibrillation ([facility] 02/29/2012   Recurrent paroxysmal, new-onset  . BPH (benign prostatic hypertrophy)   . CAD (coronary artery disease)    Cath April 2012 40% left main, 50% LAD, 40% OM, occluded RCA with L-R collaterals   . CHF (congestive heart failure)  ([facility]   . Chronic venous insufficiency   . Dyspnea   . Glaucoma   . Gout   . Hyperlipidemia   . Hypertension   . Kidney stones   . MI (myocardial infarction) ([facility]   . Obesity (BMI 30-39.9)   . Paroxysmal atrial fibrillation (HCC) 02/29/2012   Recurrent paroxysmal, new-onset   . PMR (polymyalgia rheumatica) (HCC)   . S/P CABG x 3 03/06/2012   LIMA to LAD, SVG to OM, SVG to RCA, EVH via right thigh  . S/P Maze operation for atrial fibrillation 03/06/2012   Complete bilateral lesions set using bipolar radiofrequency and cryothermy ablation with clipping of LA appendage   Past Surgical History:  Procedure Laterality Date  . CARDIAC CATHETERIZATION    . CATARACT EXTRACTION, BILATERAL    . CHOLECYSTECTOMY N/A 03/24/2012   Procedure: LAPAROSCOPIC CHOLECYSTECTOMY WITH INTRAOPERATIVE CHOLANGIOGRAM;  Surgeon: BZenovia Jarred MD;  [place] [facility]  Service: General;  Laterality: N/A;  . CORONARY ARTERY BYPASS GRAFT N/A 03/06/2012   Procedure: CORONARY ARTERY BYPASS GRAFTING (CABG);  Surgeon: CRexene Alberts MD;  [place] [facility]  Service: Open Heart Surgery;  Laterality: N/A;  . ENDOVEIN HARVEST OF GREATER SAPHENOUS VEIN Right 03/06/2012   Procedure: ENDOVEIN HARVEST OF GREATER SAPHENOUS VEIN;  Surgeon: CRexene Alberts MD;  [place] [facility]  Service: Open Heart Surgery;  Laterality: Right;  . INTRAOPERATIVE TRANSESOPHAGEAL ECHOCARDIOGRAM N/A 03/06/2012   Procedure: INTRAOPERATIVE TRANSESOPHAGEAL ECHOCARDIOGRAM;  Surgeon: CRexene Alberts MD;  [place] [facility] [place]  Service:  Open Heart Surgery;  Laterality: N/A;  . LEFT HEART CATH Right 03/02/2012   Procedure: LEFT HEART CATH;  Surgeon: Sherren Mocha, MD;  Location: [facility] C[facility];  Service: Cardiovascular;  Laterality: Right;  Marland Kitchen MAZE N/A 03/06/2012   Procedure: MAZE;  Surgeon: Rexene Alberts, MD;  [place];  Service: Open Heart Surgery;  Laterality: N/A;  . PROSTATECTOMY     Family History  Problem Relation Age of Onset  . Dementia Mother   .  Stroke Brother     Social History   Tobacco Use  . Smoking status: Former Smoker    Packs/day: 0.50    Years: 15.00    Pack years: 7.50    Types: Cigarettes    Quit date: 11/18/1962    Years since quitting: 56.8  . Smokeless tobacco: Never Used  Substance Use Topics  . Alcohol use: Yes    Alcohol/week: 2.0 standard drinks    Types: 2 Shots of liquor per week    Comment: daily   ROS  Review of Systems  Constitutional: Positive for malaise/fatigue.  Cardiovascular: Positive for leg swelling (chronic). Negative for chest pain and dyspnea on exertion.  Musculoskeletal: Positive for arthritis (mild and stable), joint pain and myalgias (recent diagnosis of PMR).  Gastrointestinal: Negative for melena.  Neurological: Positive for dizziness.   Objective  Blood pressure 135/60, pulse (!) 58, resp. rate 15, height '6\' 1"'  (1.854 m), weight 240 lb (108.9 kg), SpO2 98 %.  Vitals with BMI 09/23/2019 03/20/2019 02/11/2019  Height '6\' 1"'  '6\' 1"'  5' 11.5"  Weight 240 lbs 239 lbs 241 lbs 14 oz  BMI 31.67 33.29 51.88  Systolic 416 606 301  Diastolic 60 67 72  Pulse 58 72 68     Physical Exam Constitutional:      General: He is not in acute distress.    Appearance: He is well-developed.  Cardiovascular:     Rate and Rhythm: Normal rate and regular rhythm.     Pulses:          Carotid pulses are 2+ on the right side with bruit and 2+ on the left side with bruit.      Radial pulses are 2+ on the right side and 2+ on the left side.       Femoral pulses are 2+ on the right side and 2+ on the left side.      Popliteal pulses are 2+ on the right side and 2+ on the left side.       Dorsalis pedis pulses are 0 on the right side and 1+ on the left side.       Posterior tibial pulses are 0 on the right side and 0 on the left side.     Heart sounds: Murmur heard.  Scratchy midsystolic murmur is present with a grade of 3/6 at the upper right sternal border. S1 is normal, S2 is muffled.   No gallop.       Comments: Vascular examination reveals bilateral pigmented lower extremities with varicose veins.  2+ pitting edema. No JVD.  Pulmonary:     Effort: Pulmonary effort is normal.     Breath sounds: Normal breath sounds.  Abdominal:     General: Bowel sounds are normal.     Palpations: Abdomen is soft.    Laboratory examination:   No results for input(s): NA, K, CL, CO2, GLUCOSE, BUN, CREATININE, CALCIUM, GFRNONAA, GFRAA in the last 8760 hours. CrCl cannot be calculated (Patient's most recent  lab result is older than the maximum 21 days allowed.).  CMP Latest Ref Rng & Units 09/11/2018 08/29/2018 04/22/2015  Glucose 70 - 99 mg/dL 119(H) 101(H) -  BUN 8 - 23 mg/dL 20 23 -  Creatinine 0.61 - 1.24 mg/dL 1.39(H) 1.34 1.1  Sodium 135 - 145 mmol/L 139 140 -  Potassium 3.5 - 5.1 mmol/L 4.1 4.0 -  Chloride 98 - 111 mmol/L 103 102 -  CO2 22 - 32 mmol/L 24 30 -  Calcium 8.9 - 10.3 mg/dL 9.9 10.0 -  Total Protein 6.5 - 8.1 g/dL 7.2 6.9 -  Total Bilirubin 0.3 - 1.2 mg/dL 0.7 0.6 -  Alkaline Phos 38 - 126 U/L 47 43 -  AST 15 - 41 U/L 27 18 -  ALT 0 - 44 U/L 18 13 -   CBC Latest Ref Rng & Units 09/11/2018 08/29/2018 03/08/2015  WBC 4.0 - 10.5 K/uL 5.8 4.0 6.2  Hemoglobin 13.0 - 17.0 g/dL 12.5(L) 11.6(L) 12.6(L)  Hematocrit 39 - 52 % 38.3(L) 34.2(L) 37.2(L)  Platelets 150 - 400 K/uL 182 184.0 167   TSH No results for input(s): TSH in the last 8760 hours.  External labs:   Cholesterol, total 115.000 m 01/19/2019 HDL 47 MG/DL 01/19/2019 LDL 51.000 mg 01/19/2019 Triglycerides 84.000 01/19/2019  A1C 5.600 % 01/19/2019; TSH 2.320 01/19/2019  Hemoglobin 10.900 g/ 02/06/2019; INR 1.900 09/11/2018 Platelets 182.000 09/11/2018  Creatinine, Serum 1.300 mg/ 02/06/2019 Potassium 4.100 09/11/2018 Magnesium N/D ALT (SGPT) 15.000 uni 01/19/2019  Medications and allergies   Allergies  Allergen Reactions  . Amoxicillin Other (See Comments)    Headache, debilitated  . Bee Venom Anaphylaxis  . Morphine And  Related Other (See Comments)    hypotension    Current Outpatient Medications on File Prior to Visit  Medication Sig Dispense Refill  . allopurinol (ZYLOPRIM) 300 MG tablet Take 150 mg by mouth Daily.    Marland Kitchen amLODipine (NORVASC) 5 MG tablet TAKE 1 TABLET BY MOUTH DAILY (Patient taking differently: Take 5 mg by mouth every evening. ) 90 tablet 3  . cholecalciferol (VITAMIN D) 1000 UNITS tablet Take 1,000 Units by mouth daily.    Marland Kitchen EPINEPHrine 0.3 mg/0.3 mL IJ SOAJ injection Inject 0.3 mLs (0.3 mg total) into the muscle as needed for anaphylaxis. 2 each 2  . fish oil-omega-3 fatty acids 1000 MG capsule Take 1 g by mouth daily.     . furosemide (LASIX) 40 MG tablet TAKE 1 TABLET BY MOUTH EVERY DAY (Patient taking differently: Take 40 mg by mouth daily. ) 90 tablet 2  . galantamine (RAZADYNE ER) 8 MG 24 hr capsule Take 8 mg by mouth Daily.    . HONEY BEE VENOM IJ Inject as directed See admin instructions. Every eight weeks    . isosorbide mononitrate (IMDUR) 60 MG 24 hr tablet TAKE 1 TABLET(60 MG) BY MOUTH DAILY AFTER SUPPER (Patient taking differently: Take 60 mg by mouth every evening. ) 30 tablet 1  . memantine (NAMENDA) 10 MG tablet Take 10 mg by mouth 2 (two) times daily.    Marland Kitchen NITROSTAT 0.4 MG SL tablet Place 0.4 mg under the tongue every 5 (five) minutes x 3 doses as needed for chest pain.     . pantoprazole (PROTONIX) 40 MG tablet Take 40 mg by mouth every other day.   4  . potassium chloride (MICRO-K) 10 MEQ CR capsule TAKE 1 CAPSULE BY MOUTH DAILY WITH LASIX (Patient taking differently: Take 10 mEq by mouth daily. ) 90 capsule  1  . PRESCRIPTION MEDICATION Cpap    . rosuvastatin (CRESTOR) 10 MG tablet Take 1 tablet (10 mg total) by mouth daily. (Patient not taking: Reported on 09/23/2019) 90 tablet 3  . telmisartan (MICARDIS) 40 MG tablet Take 20 mg by mouth daily.     Marland Kitchen triamcinolone cream (KENALOG) 0.1 % Apply 1 application topically as needed (skin irritation).     . vitamin B-12  (CYANOCOBALAMIN) 1000 MCG tablet Take 1,000 mcg by mouth daily.     Marland Kitchen ZETIA 10 MG tablet Take 10 mg by mouth Daily.      No current facility-administered medications on file prior to visit.    Radiology:   CT angiogram of the chest 04/29/2017: Stable ascending thoracic aortic aneurysm measuring up to 4.3 cm. Recommend annual imaging followup by CTA or MRA. This recommendation follows 2010 ACCF/AHA/AATS/ACR/ASA/SCA/SCAI/SIR/STS/SVM Guidelines for the Diagnosis and Management of Patients with Thoracic Aortic Disease. Circulation. 2010; 121: e266-e369  Atherosclerotic changes thoracic aorta with irregular plaque/small ulceration thoracic-abdominal aortic junction similar to prior exam.  Atherosclerotic changes origin of great vessels most notable involving the left subclavian artery.  Atherosclerotic changes with narrowing superior mesenteric artery and right renal artery.  Superior aspect of abdominal aortic aneurysm incompletely assessed on the present exam.  Post CABG.  Prominent coronary artery calcification.  Aortic Atherosclerosis (ICD10-I70.0).  Cardiac Studies:   Coronary angiogram 03/02/2012: 50-60% left main, 99% LAD, 40% OM, occluded RCA with L-R collaterals  Nuclear stress test   11/22/2017: 1. Lexiscan stress test with low level exercise was performed. Exercise capacity was not assessed. Stress symptoms included dyspnea. Peak effect blood pressure was 114/66 mmHg. The resting and stress electrocardiogram demonstrated normal sinus rhythm, RBBB, no resting arrhythmias and normal rest repolarization. Stress EKG is non diagnostic for ischemia as it is a pharmacologic stress. 2. The overall quality of the study is good. Left ventricular cavity is noted to be mildly enlarged on the rest and stress studies. Gated SPECT imaging demonstrated mild hypokinesis of the basal inferolateral and mid inferolateral myocardial wall(s). The left ventricular ejection fraction was  calculated or visually estimated to be 69%. Medium sized, medium intensity, fixed perfusion defect in mid to basal inferolateral myocardium suggests old LCx territory infarct with no periinfarct ischemia. 3. Low risk study.   ABI 01/30/2019: Right: Resting right ankle-brachial index indicates moderate right lower extremity arterial disease. The right toe-brachial index is abnormal.  Left: Resting left ankle-brachial index indicates mild left lower extremity arterial disease. The left toe-brachial index is abnormal.   Echocardiogram 03/05/2019:  Mildly depressed LV systolic function with visual EF 45-50%. Mild left  ventricular hypertrophy. Mild global hypokinesis. No obvious regional wall  motion abnormalities. Left ventricle cavity is normal in size. Elevated  LAP. Calculated EF 46%.  Trileaflet aortic valve. Moderate aortic stenosis. Mild (Grade I) aortic  regurgitation. Thickened and calcified aortic valve leaflets with reduced cusp separation.  Peak velocity 3.53ms, mean gradient 268mg, Dimensional  index 0.24, AVA 1.0cm2, findings suggestive of moderate to severe aortic  stenosis.  Mild (Grade I) mitral regurgitation.  Mild tricuspid regurgitation.  Mild pulmonic regurgitation.  IVC is dilated with a respiratory response of >50%.  Prior study dated 11/2017: LVEF 56%, mild AR, mild AS (PG 2936m, MG  18m13m AVA 1.26 cm) mild pulmonary hypertension RVSP 42mm37mremaining valvular heart disease remains stable in comparison.  EKG  EKG 09/23/2019: Underlying sinus rhythm at rate of 61 bpm with PACs, leftw axis, left intrafascicular block. Right bundle branch block. Bifascicular  block. Nonspecific T abnormality. No significant change from EKG 03/20/2019, No significant change from  EKG 09/22/2018: (Probably ectopic atrial rhythm). No significant change from  EKG 08/11/2018.   Assessment     ICD-10-CM   1. Coronary artery disease of native artery of native heart with stable angina pectoris  (HCC)  I25.118 EKG 12-Lead    PCV ECHOCARDIOGRAM COMPLETE    For home use only DME 4 wheeled rolling walker with seat  2. S/P CABG x 3 and modified Maze procedure and LAA appendage ligatation 2014  Z95.1   3. S/P Maze operation for atrial fibrillation  Z98.890    Z86.79   4. Severe aortic stenosis  I35.0 PCV ECHOCARDIOGRAM COMPLETE    CMP14+EGFR    CBC    For home use only DME 4 wheeled rolling walker with seat  5. Dyspnea on exertion  R06.00 For home use only DME 4 wheeled rolling walker with seat  6. Essential hypertension  I10 TSH  7. Bilateral carotid bruits  R09.89 PCV CAROTID DUPLEX (BILATERAL)    No orders of the defined types were placed in this encounter.   Medications Discontinued During This Encounter  Medication Reason  . Fluorouracil (TOLAK) 4 % CREA No longer needed (for PRN medications)    Recommendations:   FARHAD BURLESON  is a  86 y.o. Caucasian male with Caucasian male with vitamin B12 deficiency, mild dementia, hyperlipidemia, hypertension, CAD, chronic stable angina status post CABG x 3 and Maze and LAA amputation in 2014, paroxysmal atrial fibrillation, obstructive sleep apnea not currently on CPAP, moderate aortic stenosis with thoracic aortic aneurysm and dilation of ascending aorta and follows Dr. Lilly Cove (No correlation with TTE with regards to aneurysm), venous stasis edema.  Last ED visit for   atrial fibrillation with RVR on 09/11/2018 S/P DCCV. On f/u office visit the following day had revealed marked sinus bradycardia and episodes of sinus arrest hence I discontinued the metoprolol.   Patient presents here for 63-monthvisit, he has now developed class III-IV dyspnea on exertion, dizziness with exertion activity and his physical examination is consistent with severe aortic stenosis.  No clinical evidence of heart failure.  He does have chronic leg edema.  I will set him up for right and left heart catheterization to evaluate his coronary anatomy and consider  referral to TAVR team, although he does have a small to moderate-sized thoracic aortic aneurysm, it had remained stable previously.  I have given him a prescription for a walker with a seat, also gave him a form for the handicap placard. Schedule for cardiac catheterization, and possible angioplasty. We discussed regarding risks, benefits, alternatives to this including stress testing, CTA and continued medical therapy. Patient wants to proceed. Understands <1-2% risk of death, stroke, MI, urgent CABG, bleeding, infection, 2-3% renal failure but not limited to these.  He does have stage III chronic kidney disease and will help to limit contrast use.  I will make further recommendations following his catheterization.  I also set him up for a repeat echocardiogram to establish a baseline and to compare from previous.  He has bilateral carotid artery bruit, will obtain a carotid duplex.  Although 86years of age, he is relatively active and has been doing well.   Patient is supposed to be on 15 mg of Xarelto given his stage III chronic kidney disease.  But it appears that he is on 20 mg once a day.  I tried to contact him and his wife  through the telephone encounter, I was unable to reach them.  I have sent a message to my staff to reach out to him regarding this.   Adrian Prows, MD, [facility] 09/23/2019, 12:40 PM Office: [phone]

## 2019-09-23 NOTE — Patient Instructions (Signed)
Hold Xarelto 1 day before the procedure and the day of.

## 2019-09-23 NOTE — Progress Notes (Signed)
Primary Physician/Referring:  [name], MD  Patient ID: [name] [name], male    DOB: [birth year], 86 y.o.   MRN: [id number]  Chief Complaint  Patient presents with  . Follow-up    6 month  . Coronary Artery Disease  . Hyperlipidemia   HPI:    [name] [name]  is a 86 y.o. Caucasian male with vitamin B12 deficiency, mild dementia, hyperlipidemia, hypertension, CAD, chronic stable angina status post CABG x 3 and Maze and LAA amputation in 2014, paroxysmal atrial fibrillation, obstructive sleep apnea not currently on CPAP, moderate aortic stenosis with thoracic aortic aneurysm and dilation of ascending aorta and follows Dr. Lilly Cove (No correlation with TTE with regards to aneurysm), venous stasis edema.  Last ED visit for   atrial fibrillation with RVR on 09/11/2018 S/P DCCV. On f/u office visit the following day had revealed marked sinus bradycardia and episodes of sinus arrest hence I discontinued the metoprolol.   Since last office visit he has been diagnosed with polymyalgia rheumatica and was started on steroids with excellent response.  This is 64-monthoffice visit, he has noted marked dyspnea on exertion even doing activities of daily living.  No PND or orthopnea.  Chronic leg edema has remained stable.  Wife requests a walker and also parking sticker.  He is accompanied by his wife [name] [name]  Past Medical History:  Diagnosis Date  . AAA (abdominal aortic aneurysm) ([facility]   . Acute coronary syndrome ([facility] 02/29/2012  . Acute systolic heart failure ([facility] 03/03/2012  . Angina   . Ascending aorta dilatation (HCC) 03/13/2012   Fusiform dilatation of the ascending thoracic aorta discovered during surgery, max diameter 4.2-4.3 cm  . Atrial fibrillation ([facility] 02/29/2012   Recurrent paroxysmal, new-onset  . BPH (benign prostatic hypertrophy)   . CAD (coronary artery disease)    Cath April 2012 40% left main, 50% LAD, 40% OM, occluded RCA with L-R collaterals   . CHF (congestive heart failure)  ([facility]   . Chronic venous insufficiency   . Dyspnea   . Glaucoma   . Gout   . Hyperlipidemia   . Hypertension   . Kidney stones   . MI (myocardial infarction) ([facility]   . Obesity (BMI 30-39.9)   . Paroxysmal atrial fibrillation (HCC) 02/29/2012   Recurrent paroxysmal, new-onset   . PMR (polymyalgia rheumatica) (HCC)   . S/P CABG x 3 03/06/2012   LIMA to LAD, SVG to OM, SVG to RCA, EVH via right thigh  . S/P Maze operation for atrial fibrillation 03/06/2012   Complete bilateral lesions set using bipolar radiofrequency and cryothermy ablation with clipping of LA appendage   Past Surgical History:  Procedure Laterality Date  . CARDIAC CATHETERIZATION    . CATARACT EXTRACTION, BILATERAL    . CHOLECYSTECTOMY N/A 03/24/2012   Procedure: LAPAROSCOPIC CHOLECYSTECTOMY WITH INTRAOPERATIVE CHOLANGIOGRAM;  Surgeon: BZenovia Jarred MD;  [facility]  Service: General;  Laterality: N/A;  . CORONARY ARTERY BYPASS GRAFT N/A 03/06/2012   Procedure: CORONARY ARTERY BYPASS GRAFTING (CABG);  Surgeon: CRexene Alberts MD;  [facility]  Service: Open Heart Surgery;  Laterality: N/A;  . ENDOVEIN HARVEST OF GREATER SAPHENOUS VEIN Right 03/06/2012   Procedure: ENDOVEIN HARVEST OF GREATER SAPHENOUS VEIN;  Surgeon: CRexene Alberts MD;  [facility] [facility]  Service: Open Heart Surgery;  Laterality: Right;  . INTRAOPERATIVE TRANSESOPHAGEAL ECHOCARDIOGRAM N/A 03/06/2012   Procedure: INTRAOPERATIVE TRANSESOPHAGEAL ECHOCARDIOGRAM;  Surgeon: CRexene Alberts MD;  Location: [facility]  Service:  Open Heart Surgery;  Laterality: N/A;  . LEFT HEART CATH Right 03/02/2012   Procedure: LEFT HEART CATH;  Surgeon: Sherren Mocha, MD;  Location: [facility] CATH LAB;  Service: Cardiovascular;  Laterality: Right;  Marland Kitchen MAZE N/A 03/06/2012   Procedure: MAZE;  Surgeon: Rexene Alberts, MD;  Location[place];  Service: Open Heart Surgery;  Laterality: N/A;  . PROSTATECTOMY     Family History  Problem Relation Age of Onset  . Dementia Mother   .  Stroke Brother     Social History   Tobacco Use  . Smoking status: Former Smoker    Packs/day: 0.50    Years: 15.00    Pack years: 7.50    Types: Cigarettes    Quit date: 11/18/1962    Years since quitting: 56.8  . Smokeless tobacco: Never Used  Substance Use Topics  . Alcohol use: Yes    Alcohol/week: 2.0 standard drinks    Types: 2 Shots of liquor per week    Comment: daily   ROS  Review of Systems  Constitutional: Positive for malaise/fatigue.  Cardiovascular: Positive for leg swelling (chronic). Negative for chest pain and dyspnea on exertion.  Musculoskeletal: Positive for arthritis (mild and stable), joint pain and myalgias (recent diagnosis of PMR).  Gastrointestinal: Negative for melena.  Neurological: Positive for dizziness.   Objective  Blood pressure 135/60, pulse (!) 58, resp. rate 15, height '6\' 1"'  (1.854 m), weight 240 lb (108.9 kg), SpO2 98 %.  Vitals with BMI 09/23/2019 03/20/2019 02/11/2019  Height '6\' 1"'  '6\' 1"'  5' 11.5"  Weight 240 lbs 239 lbs 241 lbs 14 oz  BMI 31.67 33.83 29.19  Systolic 166 060 045  Diastolic 60 67 72  Pulse 58 72 68     Physical Exam Constitutional:      General: He is not in acute distress.    Appearance: He is well-developed.  Cardiovascular:     Rate and Rhythm: Normal rate and regular rhythm.     Pulses:          Carotid pulses are 2+ on the right side with bruit and 2+ on the left side with bruit.      Radial pulses are 2+ on the right side and 2+ on the left side.       Femoral pulses are 2+ on the right side and 2+ on the left side.      Popliteal pulses are 2+ on the right side and 2+ on the left side.       Dorsalis pedis pulses are 0 on the right side and 1+ on the left side.       Posterior tibial pulses are 0 on the right side and 0 on the left side.     Heart sounds: Murmur heard.  Scratchy midsystolic murmur is present with a grade of 3/6 at the upper right sternal border. S1 is normal, S2 is muffled.   No gallop.       Comments: Vascular examination reveals bilateral pigmented lower extremities with varicose veins.  2+ pitting edema. No JVD.  Pulmonary:     Effort: Pulmonary effort is normal.     Breath sounds: Normal breath sounds.  Abdominal:     General: Bowel sounds are normal.     Palpations: Abdomen is soft.    Laboratory examination:   No results for input(s): NA, K, CL, CO2, GLUCOSE, BUN, CREATININE, CALCIUM, GFRNONAA, GFRAA in the last 8760 hours. CrCl cannot be calculated (Patient's most recent  lab result is older than the maximum 21 days allowed.).  CMP Latest Ref Rng & Units 09/11/2018 08/29/2018 04/22/2015  Glucose 70 - 99 mg/dL 119(H) 101(H) -  BUN 8 - 23 mg/dL 20 23 -  Creatinine 0.61 - 1.24 mg/dL 1.39(H) 1.34 1.1  Sodium 135 - 145 mmol/L 139 140 -  Potassium 3.5 - 5.1 mmol/L 4.1 4.0 -  Chloride 98 - 111 mmol/L 103 102 -  CO2 22 - 32 mmol/L 24 30 -  Calcium 8.9 - 10.3 mg/dL 9.9 10.0 -  Total Protein 6.5 - 8.1 g/dL 7.2 6.9 -  Total Bilirubin 0.3 - 1.2 mg/dL 0.7 0.6 -  Alkaline Phos 38 - 126 U/L 47 43 -  AST 15 - 41 U/L 27 18 -  ALT 0 - 44 U/L 18 13 -   CBC Latest Ref Rng & Units 09/11/2018 08/29/2018 03/08/2015  WBC 4.0 - 10.5 K/uL 5.8 4.0 6.2  Hemoglobin 13.0 - 17.0 g/dL 12.5(L) 11.6(L) 12.6(L)  Hematocrit 39 - 52 % 38.3(L) 34.2(L) 37.2(L)  Platelets 150 - 400 K/uL 182 184.0 167   TSH No results for input(s): TSH in the last 8760 hours.  External labs:   Cholesterol, total 115.000 m 01/19/2019 HDL 47 MG/DL 01/19/2019 LDL 51.000 mg 01/19/2019 Triglycerides 84.000 01/19/2019  A1C 5.600 % 01/19/2019; TSH 2.320 01/19/2019  Hemoglobin 10.900 g/ 02/06/2019; INR 1.900 09/11/2018 Platelets 182.000 09/11/2018  Creatinine, Serum 1.300 mg/ 02/06/2019 Potassium 4.100 09/11/2018 Magnesium N/D ALT (SGPT) 15.000 uni 01/19/2019  Medications and allergies   Allergies  Allergen Reactions  . Amoxicillin Other (See Comments)    Headache, debilitated  . Bee Venom Anaphylaxis  . Morphine And  Related Other (See Comments)    hypotension    Current Outpatient Medications on File Prior to Visit  Medication Sig Dispense Refill  . allopurinol (ZYLOPRIM) 300 MG tablet Take 150 mg by mouth Daily.    Marland Kitchen amLODipine (NORVASC) 5 MG tablet TAKE 1 TABLET BY MOUTH DAILY (Patient taking differently: Take 5 mg by mouth every evening. ) 90 tablet 3  . cholecalciferol (VITAMIN D) 1000 UNITS tablet Take 1,000 Units by mouth daily.    Marland Kitchen EPINEPHrine 0.3 mg/0.3 mL IJ SOAJ injection Inject 0.3 mLs (0.3 mg total) into the muscle as needed for anaphylaxis. 2 each 2  . fish oil-omega-3 fatty acids 1000 MG capsule Take 1 g by mouth daily.     . furosemide (LASIX) 40 MG tablet TAKE 1 TABLET BY MOUTH EVERY DAY (Patient taking differently: Take 40 mg by mouth daily. ) 90 tablet 2  . galantamine (RAZADYNE ER) 8 MG 24 hr capsule Take 8 mg by mouth Daily.    . HONEY BEE VENOM IJ Inject as directed See admin instructions. Every eight weeks    . isosorbide mononitrate (IMDUR) 60 MG 24 hr tablet TAKE 1 TABLET(60 MG) BY MOUTH DAILY AFTER SUPPER (Patient taking differently: Take 60 mg by mouth every evening. ) 30 tablet 1  . memantine (NAMENDA) 10 MG tablet Take 10 mg by mouth 2 (two) times daily.    Marland Kitchen NITROSTAT 0.4 MG SL tablet Place 0.4 mg under the tongue every 5 (five) minutes x 3 doses as needed for chest pain.     . pantoprazole (PROTONIX) 40 MG tablet Take 40 mg by mouth every other day.   4  . potassium chloride (MICRO-K) 10 MEQ CR capsule TAKE 1 CAPSULE BY MOUTH DAILY WITH LASIX (Patient taking differently: Take 10 mEq by mouth daily. ) 90 capsule  1  . PRESCRIPTION MEDICATION Cpap    . rosuvastatin (CRESTOR) 10 MG tablet Take 1 tablet (10 mg total) by mouth daily. (Patient not taking: Reported on 09/23/2019) 90 tablet 3  . telmisartan (MICARDIS) 40 MG tablet Take 20 mg by mouth daily.     Marland Kitchen triamcinolone cream (KENALOG) 0.1 % Apply 1 application topically as needed (skin irritation).     . vitamin B-12  (CYANOCOBALAMIN) 1000 MCG tablet Take 1,000 mcg by mouth daily.     Marland Kitchen ZETIA 10 MG tablet Take 10 mg by mouth Daily.      No current facility-administered medications on file prior to visit.    Radiology:   CT angiogram of the chest 04/29/2017: Stable ascending thoracic aortic aneurysm measuring up to 4.3 cm. Recommend annual imaging followup by CTA or MRA. This recommendation follows 2010 ACCF/AHA/AATS/ACR/ASA/SCA/SCAI/SIR/STS/SVM Guidelines for the Diagnosis and Management of Patients with Thoracic Aortic Disease. Circulation. 2010; 121: e266-e369  Atherosclerotic changes thoracic aorta with irregular plaque/small ulceration thoracic-abdominal aortic junction similar to prior exam.  Atherosclerotic changes origin of great vessels most notable involving the left subclavian artery.  Atherosclerotic changes with narrowing superior mesenteric artery and right renal artery.  Superior aspect of abdominal aortic aneurysm incompletely assessed on the present exam.  Post CABG.  Prominent coronary artery calcification.  Aortic Atherosclerosis (ICD10-I70.0).  Cardiac Studies:   Coronary angiogram 03/02/2012: 50-60% left main, 99% LAD, 40% OM, occluded RCA with L-R collaterals  Nuclear stress test   11/22/2017: 1. Lexiscan stress test with low level exercise was performed. Exercise capacity was not assessed. Stress symptoms included dyspnea. Peak effect blood pressure was 114/66 mmHg. The resting and stress electrocardiogram demonstrated normal sinus rhythm, RBBB, no resting arrhythmias and normal rest repolarization. Stress EKG is non diagnostic for ischemia as it is a pharmacologic stress. 2. The overall quality of the study is good. Left ventricular cavity is noted to be mildly enlarged on the rest and stress studies. Gated SPECT imaging demonstrated mild hypokinesis of the basal inferolateral and mid inferolateral myocardial wall(s). The left ventricular ejection fraction was  calculated or visually estimated to be 69%. Medium sized, medium intensity, fixed perfusion defect in mid to basal inferolateral myocardium suggests old LCx territory infarct with no periinfarct ischemia. 3. Low risk study.   ABI 01/30/2019: Right: Resting right ankle-brachial index indicates moderate right lower extremity arterial disease. The right toe-brachial index is abnormal.  Left: Resting left ankle-brachial index indicates mild left lower extremity arterial disease. The left toe-brachial index is abnormal.   Echocardiogram 03/05/2019:  Mildly depressed LV systolic function with visual EF 45-50%. Mild left  ventricular hypertrophy. Mild global hypokinesis. No obvious regional wall  motion abnormalities. Left ventricle cavity is normal in size. Elevated  LAP. Calculated EF 46%.  Trileaflet aortic valve. Moderate aortic stenosis. Mild (Grade I) aortic  regurgitation. Thickened and calcified aortic valve leaflets with reduced cusp separation.  Peak velocity 3.27ms, mean gradient 254mg, Dimensional  index 0.24, AVA 1.0cm2, findings suggestive of moderate to severe aortic  stenosis.  Mild (Grade I) mitral regurgitation.  Mild tricuspid regurgitation.  Mild pulmonic regurgitation.  IVC is dilated with a respiratory response of >50%.  Prior study dated 11/2017: LVEF 56%, mild AR, mild AS (PG 2934m, MG  29m81m AVA 1.26 cm) mild pulmonary hypertension RVSP 42mm59mremaining valvular heart disease remains stable in comparison.  EKG  EKG 09/23/2019: Underlying sinus rhythm at rate of 61 bpm with PACs, leftw axis, left intrafascicular block. Right bundle branch block. Bifascicular  block. Nonspecific T abnormality. No significant change from EKG 03/20/2019, No significant change from  EKG 09/22/2018: (Probably ectopic atrial rhythm). No significant change from  EKG 08/11/2018.   Assessment     ICD-10-CM   1. Coronary artery disease of native artery of native heart with stable angina pectoris  (HCC)  I25.118 EKG 12-Lead    PCV ECHOCARDIOGRAM COMPLETE    For home use only DME 4 wheeled rolling walker with seat  2. S/P CABG x 3 and modified Maze procedure and LAA appendage ligatation 2014  Z95.1   3. S/P Maze operation for atrial fibrillation  Z98.890    Z86.79   4. Severe aortic stenosis  I35.0 PCV ECHOCARDIOGRAM COMPLETE    CMP14+EGFR    CBC    For home use only DME 4 wheeled rolling walker with seat  5. Dyspnea on exertion  R06.00 For home use only DME 4 wheeled rolling walker with seat  6. Essential hypertension  I10 TSH  7. Bilateral carotid bruits  R09.89 PCV CAROTID DUPLEX (BILATERAL)    No orders of the defined types were placed in this encounter.   Medications Discontinued During This Encounter  Medication Reason  . Fluorouracil (TOLAK) 4 % CREA No longer needed (for PRN medications)    Recommendations:   [name] [name]  is a  86 y.o. Caucasian male with Caucasian male with vitamin B12 deficiency, mild dementia, hyperlipidemia, hypertension, CAD, chronic stable angina status post CABG x 3 and Maze and LAA amputation in 2014, paroxysmal atrial fibrillation, obstructive sleep apnea not currently on CPAP, moderate aortic stenosis with thoracic aortic aneurysm and dilation of ascending aorta and follows Dr. Lilly Cove (No correlation with TTE with regards to aneurysm), venous stasis edema.  Last ED visit for   atrial fibrillation with RVR on 09/11/2018 S/P DCCV. On f/u office visit the following day had revealed marked sinus bradycardia and episodes of sinus arrest hence I discontinued the metoprolol.   Patient presents here for 44-monthvisit, he has now developed class III-IV dyspnea on exertion, dizziness with exertion activity and his physical examination is consistent with severe aortic stenosis.  No clinical evidence of heart failure.  He does have chronic leg edema.  I will set him up for right and left heart catheterization to evaluate his coronary anatomy and consider  referral to TAVR team, although he does have a small to moderate-sized thoracic aortic aneurysm, it had remained stable previously.  I have given him a prescription for a walker with a seat, also gave him a form for the handicap placard. Schedule for cardiac catheterization, and possible angioplasty. We discussed regarding risks, benefits, alternatives to this including stress testing, CTA and continued medical therapy. Patient wants to proceed. Understands <1-2% risk of death, stroke, MI, urgent CABG, bleeding, infection, 2-3% renal failure but not limited to these.  He does have stage III chronic kidney disease and will help to limit contrast use.  I will make further recommendations following his catheterization.  I also set him up for a repeat echocardiogram to establish a baseline and to compare from previous.  He has bilateral carotid artery bruit, will obtain a carotid duplex.  Although 86years of age, he is relatively active and has been doing well.   Patient is supposed to be on 15 mg of Xarelto given his stage III chronic kidney disease.  But it appears that he is on 20 mg once a day.  I tried to contact him and his wife  through the telephone encounter, I was unable to reach them.  I have sent a message to my staff to reach out to him regarding this.   Adrian Prows, MD, [facility] 09/23/2019, 12:40 PM Office: [phone]

## 2019-09-24 ENCOUNTER — Ambulatory Visit: Payer: Medicare Other

## 2019-09-24 DIAGNOSIS — I25118 Atherosclerotic heart disease of native coronary artery with other forms of angina pectoris: Secondary | ICD-10-CM

## 2019-09-24 DIAGNOSIS — I350 Nonrheumatic aortic (valve) stenosis: Secondary | ICD-10-CM

## 2019-09-24 DIAGNOSIS — R0989 Other specified symptoms and signs involving the circulatory and respiratory systems: Secondary | ICD-10-CM | POA: Diagnosis not present

## 2019-09-24 LAB — CBC
Hematocrit: 32.9 % — ABNORMAL LOW (ref 37.5–51.0)
Hemoglobin: 10.8 g/dL — ABNORMAL LOW (ref 13.0–17.7)
MCH: 31.2 pg (ref 26.6–33.0)
MCHC: 32.8 g/dL (ref 31.5–35.7)
MCV: 95 fL (ref 79–97)
Platelets: 199 10*3/uL (ref 150–450)
RBC: 3.46 x10E6/uL — ABNORMAL LOW (ref 4.14–5.80)
RDW: 13.2 % (ref 11.6–15.4)
WBC: 7.4 10*3/uL (ref 3.4–10.8)

## 2019-09-24 LAB — CMP14+EGFR
ALT: 16 IU/L (ref 0–44)
AST: 14 IU/L (ref 0–40)
Albumin/Globulin Ratio: 2 (ref 1.2–2.2)
Albumin: 4.4 g/dL (ref 3.5–4.6)
Alkaline Phosphatase: 58 IU/L (ref 44–121)
BUN/Creatinine Ratio: 21 (ref 10–24)
BUN: 30 mg/dL (ref 10–36)
Bilirubin Total: 0.4 mg/dL (ref 0.0–1.2)
CO2: 23 mmol/L (ref 20–29)
Calcium: 9.8 mg/dL (ref 8.6–10.2)
Chloride: 101 mmol/L (ref 96–106)
Creatinine, Ser: 1.45 mg/dL — ABNORMAL HIGH (ref 0.76–1.27)
GFR calc Af Amer: 48 mL/min/{1.73_m2} — ABNORMAL LOW (ref >59)
GFR calc non Af Amer: 42 mL/min/{1.73_m2} — ABNORMAL LOW (ref >59)
Globulin, Total: 2.2 g/dL (ref 1.5–4.5)
Glucose: 117 mg/dL — ABNORMAL HIGH (ref 65–99)
Potassium: 4.6 mmol/L (ref 3.5–5.2)
Sodium: 140 mmol/L (ref 134–144)
Total Protein: 6.6 g/dL (ref 6.0–8.5)

## 2019-09-24 LAB — TSH: TSH: 1.95 u[IU]/mL (ref 0.450–4.500)

## 2019-09-25 ENCOUNTER — Other Ambulatory Visit (HOSPITAL_COMMUNITY)
Admission: RE | Admit: 2019-09-25 | Discharge: 2019-09-25 | Disposition: A | Discharge status: Home / Self Care | Payer: Medicare Other | Source: Ambulatory Visit | Attending: Cardiology | Admitting: Cardiology

## 2019-09-25 DIAGNOSIS — Z01812 Encounter for preprocedural laboratory examination: Secondary | ICD-10-CM | POA: Insufficient documentation

## 2019-09-25 DIAGNOSIS — Z20822 Contact with and (suspected) exposure to covid-19: Secondary | ICD-10-CM | POA: Insufficient documentation

## 2019-09-25 LAB — SARS CORONAVIRUS 2 (TAT 6-24 HRS): SARS Coronavirus 2: NEGATIVE

## 2019-09-28 DIAGNOSIS — E785 Hyperlipidemia, unspecified: Secondary | ICD-10-CM | POA: Diagnosis not present

## 2019-09-28 DIAGNOSIS — M2550 Pain in unspecified joint: Secondary | ICD-10-CM | POA: Diagnosis not present

## 2019-09-28 DIAGNOSIS — R700 Elevated erythrocyte sedimentation rate: Secondary | ICD-10-CM | POA: Diagnosis not present

## 2019-09-28 DIAGNOSIS — M353 Polymyalgia rheumatica: Secondary | ICD-10-CM | POA: Diagnosis not present

## 2019-09-29 ENCOUNTER — Encounter (HOSPITAL_COMMUNITY): Payer: Self-pay | Admitting: Cardiology

## 2019-09-29 ENCOUNTER — Ambulatory Visit (HOSPITAL_COMMUNITY)
Admission: RE | Disposition: A | Discharge status: Home / Self Care | Payer: Self-pay | Source: Home / Self Care | Attending: Cardiology

## 2019-09-29 ENCOUNTER — Ambulatory Visit (HOSPITAL_COMMUNITY)
Admission: RE | Admit: 2019-09-29 | Discharge: 2019-09-29 | Disposition: A | Discharge status: Home / Self Care | Payer: Medicare Other | Attending: Cardiology | Admitting: Cardiology

## 2019-09-29 DIAGNOSIS — R0602 Shortness of breath: Secondary | ICD-10-CM | POA: Diagnosis present

## 2019-09-29 DIAGNOSIS — Z87891 Personal history of nicotine dependence: Secondary | ICD-10-CM | POA: Insufficient documentation

## 2019-09-29 DIAGNOSIS — Z79899 Other long term (current) drug therapy: Secondary | ICD-10-CM | POA: Diagnosis not present

## 2019-09-29 DIAGNOSIS — Z951 Presence of aortocoronary bypass graft: Secondary | ICD-10-CM | POA: Diagnosis not present

## 2019-09-29 DIAGNOSIS — I712 Thoracic aortic aneurysm, without rupture: Secondary | ICD-10-CM | POA: Insufficient documentation

## 2019-09-29 DIAGNOSIS — I110 Hypertensive heart disease with heart failure: Secondary | ICD-10-CM | POA: Insufficient documentation

## 2019-09-29 DIAGNOSIS — I25118 Atherosclerotic heart disease of native coronary artery with other forms of angina pectoris: Secondary | ICD-10-CM | POA: Diagnosis not present

## 2019-09-29 DIAGNOSIS — G4733 Obstructive sleep apnea (adult) (pediatric): Secondary | ICD-10-CM | POA: Diagnosis not present

## 2019-09-29 DIAGNOSIS — I252 Old myocardial infarction: Secondary | ICD-10-CM | POA: Insufficient documentation

## 2019-09-29 DIAGNOSIS — I350 Nonrheumatic aortic (valve) stenosis: Secondary | ICD-10-CM | POA: Diagnosis not present

## 2019-09-29 DIAGNOSIS — E785 Hyperlipidemia, unspecified: Secondary | ICD-10-CM | POA: Diagnosis not present

## 2019-09-29 DIAGNOSIS — F0390 Unspecified dementia without behavioral disturbance: Secondary | ICD-10-CM | POA: Insufficient documentation

## 2019-09-29 DIAGNOSIS — I480 Paroxysmal atrial fibrillation: Secondary | ICD-10-CM | POA: Diagnosis not present

## 2019-09-29 DIAGNOSIS — M353 Polymyalgia rheumatica: Secondary | ICD-10-CM | POA: Diagnosis not present

## 2019-09-29 DIAGNOSIS — I5021 Acute systolic (congestive) heart failure: Secondary | ICD-10-CM | POA: Insufficient documentation

## 2019-09-29 DIAGNOSIS — M109 Gout, unspecified: Secondary | ICD-10-CM | POA: Insufficient documentation

## 2019-09-29 DIAGNOSIS — E538 Deficiency of other specified B group vitamins: Secondary | ICD-10-CM | POA: Insufficient documentation

## 2019-09-29 DIAGNOSIS — R0609 Other forms of dyspnea: Secondary | ICD-10-CM | POA: Diagnosis not present

## 2019-09-29 HISTORY — PX: RIGHT/LEFT HEART CATH AND CORONARY/GRAFT ANGIOGRAPHY: CATH118267

## 2019-09-29 HISTORY — DX: Nonrheumatic aortic (valve) stenosis: I35.0

## 2019-09-29 LAB — POCT I-STAT EG7
Acid-Base Excess: 3 mmol/L — ABNORMAL HIGH (ref 0.0–2.0)
Acid-Base Excess: 2 mmol/L (ref 0.0–2.0)
Bicarbonate: 28.4 mmol/L — ABNORMAL HIGH (ref 20.0–28.0)
Bicarbonate: 28 mmol/L (ref 20.0–28.0)
Calcium, Ion: 1.33 mmol/L (ref 1.15–1.40)
Calcium, Ion: 1.33 mmol/L (ref 1.15–1.40)
HCT: 27 % — ABNORMAL LOW (ref 39.0–52.0)
HCT: 27 % — ABNORMAL LOW (ref 39.0–52.0)
Hemoglobin: 9.2 g/dL — ABNORMAL LOW (ref 13.0–17.0)
Hemoglobin: 9.2 g/dL — ABNORMAL LOW (ref 13.0–17.0)
O2 Saturation: 62 %
O2 Saturation: 63 %
Potassium: 3.6 mmol/L (ref 3.5–5.1)
Potassium: 3.6 mmol/L (ref 3.5–5.1)
Sodium: 141 mmol/L (ref 135–145)
Sodium: 141 mmol/L (ref 135–145)
TCO2: 30 mmol/L (ref 22–32)
TCO2: 29 mmol/L (ref 22–32)
pCO2, Ven: 47.8 mmHg (ref 44.0–60.0)
pCO2, Ven: 47.1 mmHg (ref 44.0–60.0)
pH, Ven: 7.382 (ref 7.250–7.430)
pH, Ven: 7.382 (ref 7.250–7.430)
pO2, Ven: 33 mmHg (ref 32.0–45.0)
pO2, Ven: 34 mmHg (ref 32.0–45.0)

## 2019-09-29 SURGERY — RIGHT/LEFT HEART CATH AND CORONARY/GRAFT ANGIOGRAPHY
Anesthesia: LOCAL

## 2019-09-29 MED ORDER — SODIUM CHLORIDE 0.9 % IV SOLN: INTRAVENOUS | Status: DC

## 2019-09-29 MED ORDER — IOHEXOL 350 MG/ML SOLN
INTRAVENOUS | Status: DC | PRN
  Administered 2019-09-29: 110 mL

## 2019-09-29 MED ORDER — LIDOCAINE HCL (PF) 1 % IJ SOLN
INTRAMUSCULAR | Status: DC | PRN
  Administered 2019-09-29 (×2): 2 mL

## 2019-09-29 MED ORDER — SODIUM CHLORIDE 0.9 % WEIGHT BASED INFUSION: 1.5000 mL/kg/h | INTRAVENOUS | Status: DC

## 2019-09-29 MED ORDER — ONDANSETRON HCL 4 MG/2ML IJ SOLN: 4.0000 mg | Freq: Four times a day (QID) | INTRAMUSCULAR | Status: DC | PRN

## 2019-09-29 MED ORDER — SODIUM CHLORIDE 0.9% FLUSH: 3.0000 mL | INTRAVENOUS | Status: DC | PRN

## 2019-09-29 MED ORDER — VERAPAMIL HCL 2.5 MG/ML IV SOLN
INTRAVENOUS | Status: DC | PRN
  Administered 2019-09-29 (×2): 5 mL via INTRA_ARTERIAL

## 2019-09-29 MED ORDER — HEPARIN SODIUM (PORCINE) 1000 UNIT/ML IJ SOLN
INTRAMUSCULAR | Status: AC
  Filled 2019-09-29: qty 1

## 2019-09-29 MED ORDER — VERAPAMIL HCL 2.5 MG/ML IV SOLN
INTRAVENOUS | Status: AC
  Filled 2019-09-29: qty 2

## 2019-09-29 MED ORDER — FENTANYL CITRATE (PF) 100 MCG/2ML IJ SOLN
INTRAMUSCULAR | Status: DC | PRN
Start: 2019-09-29 — End: 2019-09-29
  Administered 2019-09-29 (×2): 25 ug via INTRAVENOUS

## 2019-09-29 MED ORDER — MIDAZOLAM HCL 2 MG/2ML IJ SOLN
INTRAMUSCULAR | Status: AC
  Filled 2019-09-29: qty 2

## 2019-09-29 MED ORDER — LIDOCAINE HCL (PF) 1 % IJ SOLN
INTRAMUSCULAR | Status: AC
  Filled 2019-09-29: qty 30

## 2019-09-29 MED ORDER — MIDAZOLAM HCL 2 MG/2ML IJ SOLN
INTRAMUSCULAR | Status: DC | PRN
  Administered 2019-09-29 (×2): 1 mg via INTRAVENOUS

## 2019-09-29 MED ORDER — HEPARIN SODIUM (PORCINE) 1000 UNIT/ML IJ SOLN
INTRAMUSCULAR | Status: DC | PRN
  Administered 2019-09-29: 5000 [IU] via INTRAVENOUS

## 2019-09-29 MED ORDER — ASPIRIN 81 MG PO CHEW
81.0000 mg | CHEWABLE_TABLET | ORAL | Status: AC
  Administered 2019-09-29: 81 mg via ORAL
  Filled 2019-09-29: qty 1

## 2019-09-29 MED ORDER — HEPARIN (PORCINE) IN NACL 1000-0.9 UT/500ML-% IV SOLN
INTRAVENOUS | Status: AC
  Filled 2019-09-29: qty 1000

## 2019-09-29 MED ORDER — SODIUM CHLORIDE 0.9% FLUSH: 3.0000 mL | Freq: Two times a day (BID) | INTRAVENOUS | Status: DC

## 2019-09-29 MED ORDER — SODIUM CHLORIDE 0.9 % IV SOLN: 250.0000 mL | INTRAVENOUS | Status: DC | PRN

## 2019-09-29 MED ORDER — FENTANYL CITRATE (PF) 100 MCG/2ML IJ SOLN
INTRAMUSCULAR | Status: AC
  Filled 2019-09-29: qty 2

## 2019-09-29 MED ORDER — ACETAMINOPHEN 325 MG PO TABS: 650.0000 mg | ORAL_TABLET | ORAL | Status: DC | PRN

## 2019-09-29 MED ORDER — HEPARIN (PORCINE) IN NACL 1000-0.9 UT/500ML-% IV SOLN
INTRAVENOUS | Status: DC | PRN
  Administered 2019-09-29 (×3): 500 mL

## 2019-09-29 SURGICAL SUPPLY — 22 items
CATH BALLN WEDGE 5F 110CM (CATHETERS) ×1 IMPLANT
CATH INFINITI 5 FR IM (CATHETERS) ×1 IMPLANT
CATH INFINITI 5 FR LCB (CATHETERS) ×1 IMPLANT
CATH INFINITI 5FR AL1 (CATHETERS) ×1 IMPLANT
CATH INFINITI 5FR ANG PIGTAIL (CATHETERS) ×1 IMPLANT
CATH INFINITI 5FR JL4 (CATHETERS) ×1 IMPLANT
CATH INFINITI 5FR MPB2 (CATHETERS) ×1 IMPLANT
CATH OPTITORQUE TIG 4.0 5F (CATHETERS) ×1 IMPLANT
DEVICE RAD COMP TR BAND LRG (VASCULAR PRODUCTS) ×1 IMPLANT
ELECT DEFIB PAD ADLT CADENCE (PAD) ×1 IMPLANT
GLIDESHEATH SLEND A-KIT 6F 22G (SHEATH) ×1 IMPLANT
GUIDEWIRE .025 260CM (WIRE) ×1 IMPLANT
GUIDEWIRE INQWIRE 1.5J.035X260 (WIRE) IMPLANT
INQWIRE 1.5J .035X260CM (WIRE) ×2
KIT HEART LEFT (KITS) ×2 IMPLANT
PACK CARDIAC CATHETERIZATION (CUSTOM PROCEDURE TRAY) ×2 IMPLANT
SHEATH GLIDE SLENDER 4/5FR (SHEATH) ×1 IMPLANT
SHEATH SLENDER DESTINATION 6FR (SHEATH) ×1 IMPLANT
TRANSDUCER W/STOPCOCK (MISCELLANEOUS) ×2 IMPLANT
TUBING CIL FLEX 10 FLL-RA (TUBING) ×2 IMPLANT
WIRE HI TORQ VERSACORE-J 145CM (WIRE) ×1 IMPLANT
WIRE VERSACORE LOC 115CM (WIRE) ×1 IMPLANT

## 2019-09-29 NOTE — Interval H&P Note (Signed)
History and Physical Interval Note:  09/29/2019 6:46 AM  [name] [name]  has presented today for surgery, with the diagnosis of cad.  The various methods of treatment have been discussed with the patient and family. After consideration of risks, benefits and other options for treatment, the patient has consented to  Procedure(s): RIGHT/LEFT HEART CATH AND CORONARY/GRAFT ANGIOGRAPHY (N/A) as a surgical intervention.  The patient's history has been reviewed, patient examined, no change in status, stable for surgery.  I have reviewed the patient's chart and labs.  Questions were answered to the patient's satisfaction.     Adrian Prows

## 2019-09-29 NOTE — Progress Notes (Signed)
Discharge instructions reviewed with pt and his wife (via telephone) Both voice understanding.  

## 2019-09-29 NOTE — Discharge Instructions (Signed)
Drink plenty of fluids Keep left arm at or above heart level for 24 hours  Radial Site Care  This sheet gives you information about how to care for yourself after your procedure. Your health care provider may also give you more specific instructions. If you have problems or questions, contact your health care provider. What can I expect after the procedure? After the procedure, it is common to have:  Bruising and tenderness at the catheter insertion area. Follow these instructions at home: Medicines  Take over-the-counter and prescription medicines only as told by your health care provider. Insertion site care  Follow instructions from your health care provider about how to take care of your insertion site. Make sure you: ? Wash your hands with soap and water before you change your bandage (dressing). If soap and water are not available, use hand sanitizer. ? Change your dressing as told by your health care provider. ? Leave stitches (sutures), skin glue, or adhesive strips in place. These skin closures may need to stay in place for 2 weeks or longer. If adhesive strip edges start to loosen and curl up, you may trim the loose edges. Do not remove adhesive strips completely unless your health care provider tells you to do that.  Check your insertion site every day for signs of infection. Check for: ? Redness, swelling, or pain. ? Fluid or blood. ? Pus or a bad smell. ? Warmth.  Do not take baths, swim, or use a hot tub until your health care provider approves.  You may shower 24-48 hours after the procedure, or as directed by your health care provider. ? Remove the dressing and gently wash the site with plain soap and water. ? Pat the area dry with a clean towel. ? Do not rub the site. That could cause bleeding.  Do not apply powder or lotion to the site. Activity   For 24 hours after the procedure, or as directed by your health care provider: ? Do not flex or bend the affected  arm. ? Do not push or pull heavy objects with the affected arm. ? Do not drive yourself home from the hospital or clinic. You may drive 24 hours after the procedure unless your health care provider tells you not to. ? Do not operate machinery or power tools.  Do not lift anything that is heavier than 10 lb (4.5 kg), or the limit that you are told, until your health care provider says that it is safe.  Ask your health care provider when it is okay to: ? Return to work or school. ? Resume usual physical activities or sports. ? Resume sexual activity. General instructions  If the catheter site starts to bleed, raise your arm and put firm pressure on the site. If the bleeding does not stop, get help right away. This is a medical emergency.  If you went home on the same day as your procedure, a responsible adult should be with you for the first 24 hours after you arrive home.  Keep all follow-up visits as told by your health care provider. This is important. Contact a health care provider if:  You have a fever.  You have redness, swelling, or yellow drainage around your insertion site. Get help right away if:  You have unusual pain at the radial site.  The catheter insertion area swells very fast.  The insertion area is bleeding, and the bleeding does not stop when you hold steady pressure on the area.  Your  arm or hand becomes pale, cool, tingly, or numb. These symptoms may represent a serious problem that is an emergency. Do not wait to see if the symptoms will go away. Get medical help right away. Call your local emergency services (911 in the [place] Do not drive yourself to the hospital. Summary  After the procedure, it is common to have bruising and tenderness at the site.  Follow instructions from your health care provider about how to take care of your radial site wound. Check the wound every day for signs of infection.  Do not lift anything that is heavier than 10 lb (4.5  kg), or the limit that you are told, until your health care provider says that it is safe. This information is not intended to replace advice given to you by your health care provider. Make sure you discuss any questions you have with your health care provider. Document Revised: 01/23/2017 Document Reviewed: 01/23/2017 Elsevier Patient Education  2020 [employer].

## 2019-09-30 ENCOUNTER — Encounter (HOSPITAL_COMMUNITY): Payer: Self-pay | Admitting: Cardiology

## 2019-09-30 MED FILL — Verapamil HCl IV Soln 2.5 MG/ML: INTRAVENOUS | Qty: 2 | Status: AC

## 2019-10-01 ENCOUNTER — Telehealth: Payer: Self-pay | Admitting: Cardiology

## 2019-10-01 DIAGNOSIS — I350 Nonrheumatic aortic (valve) stenosis: Secondary | ICD-10-CM

## 2019-10-01 DIAGNOSIS — R0609 Other forms of dyspnea: Secondary | ICD-10-CM

## 2019-10-01 DIAGNOSIS — R0600 Dyspnea, unspecified: Secondary | ICD-10-CM

## 2019-10-01 NOTE — Telephone Encounter (Signed)
Right and left heart catheterization 09/29/2019: Right heart catheterization RA 8/8, mean 6 mmHg.  RV 34/0, EDP 8 mmHg.;  PA 31/11, mean 18 mmHg.  PA saturation 62%.;  PW 13/25, mean 11 mmHg.  Aortic saturation 100%. Cardiac output 5.57, cardiac index 2.38, normal.  Normal SVR and PVR. LV: 1/4, EDP 14 mmHg.  Peak to peak aortic valve gradient was 41 with a mean gradient of 33.3 with a valve area of 1.09 cm.  Left heart catheterization Left main is calcific 50% to 60% stenosis distally and involves ostium of the large circumflex with a ostial 80% stenosis.  Gives origin to large OM1, OM 2. SVG to OM1 is widely patent and retrogradely fills the entire circumflex.  There is a 70 to 75% stenosis proximal to the graft insertion. LAD: Gives origin to large D1 and D2, mild diffuse disease, subtotally occluded to occluded in the midsegment. LIMA to LAD: Widely patent. RCA: Dominant and occluded in the proximal segment. SVG to RCA: Widely patent.  Impression: Moderate to severe aortic stenosis, symptomatic.  Patient will benefit from aortic valve replacement and consideration for TAVR.  110 mL contrast utilized, fluoroscopy time 42.8 minutes.  Echocardiogram 03/05/2019:  Mildly depressed LV systolic function with visual EF 45-50%. Mild left  ventricular hypertrophy. Mild global hypokinesis. No obvious regional wall  motion abnormalities. Left ventricle cavity is normal in size. Elevated  LAP. Calculated EF 46%.  Trileaflet aortic valve. Moderate aortic stenosis. Mild (Grade I) aortic  regurgitation. Thickened and calcified aortic valve leaflets with reduced cusp separation.  Peak velocity 3.61m/s, mean gradient 20mmHg, Dimensional  index 0.24, AVA 1.0cm2, findings suggestive of moderate to severe aortic  stenosis.  Mild (Grade I) mitral regurgitation.  Mild tricuspid regurgitation.  Mild pulmonic regurgitation.  IVC is dilated with a respiratory response of >50%.  Prior study dated 11/2017: LVEF  56%, mild AR, mild AS (PG 51mmHG, MG  14mmHG, AVA 1.26 cm) mild pulmonary hypertension RVSP 76mmHG, remaining valvular heart disease remains stable in comparison.  Rec:  Will refer to Dr. Roxy Manns for evaluation   Adrian Prows, MD, [facility] 10/01/2019, 4:24 PM Office: [phone]

## 2019-10-05 ENCOUNTER — Other Ambulatory Visit: Payer: Self-pay | Admitting: Physician Assistant

## 2019-10-05 ENCOUNTER — Encounter: Payer: Self-pay | Admitting: Physician Assistant

## 2019-10-05 DIAGNOSIS — D6869 Other thrombophilia: Secondary | ICD-10-CM | POA: Diagnosis not present

## 2019-10-05 DIAGNOSIS — N183 Chronic kidney disease, stage 3 unspecified: Secondary | ICD-10-CM

## 2019-10-05 DIAGNOSIS — I350 Nonrheumatic aortic (valve) stenosis: Secondary | ICD-10-CM

## 2019-10-05 DIAGNOSIS — I10 Essential (primary) hypertension: Secondary | ICD-10-CM | POA: Diagnosis not present

## 2019-10-05 DIAGNOSIS — F0390 Unspecified dementia without behavioral disturbance: Secondary | ICD-10-CM | POA: Diagnosis not present

## 2019-10-05 DIAGNOSIS — E538 Deficiency of other specified B group vitamins: Secondary | ICD-10-CM | POA: Diagnosis not present

## 2019-10-05 DIAGNOSIS — D649 Anemia, unspecified: Secondary | ICD-10-CM | POA: Diagnosis not present

## 2019-10-05 NOTE — Telephone Encounter (Signed)
Pt called and said that his pcp mentioned you wanted him on 15 mg of xarelto, but the patient is still taking 20 and has a lot of them left. Please advise.

## 2019-10-05 NOTE — Telephone Encounter (Signed)
Can finish but risk of bleed high. Then 15 mg daily with meal in the evening

## 2019-10-09 ENCOUNTER — Other Ambulatory Visit: Payer: Medicare Other | Admitting: *Deleted

## 2019-10-09 ENCOUNTER — Other Ambulatory Visit: Payer: Self-pay

## 2019-10-09 DIAGNOSIS — N183 Chronic kidney disease, stage 3 unspecified: Secondary | ICD-10-CM

## 2019-10-09 LAB — BASIC METABOLIC PANEL
BUN/Creatinine Ratio: 19 (ref 10–24)
BUN: 25 mg/dL (ref 10–36)
CO2: 23 mmol/L (ref 20–29)
Calcium: 10 mg/dL (ref 8.6–10.2)
Chloride: 103 mmol/L (ref 96–106)
Creatinine, Ser: 1.33 mg/dL — ABNORMAL HIGH (ref 0.76–1.27)
GFR calc Af Amer: 54 mL/min/{1.73_m2} — ABNORMAL LOW (ref >59)
GFR calc non Af Amer: 46 mL/min/{1.73_m2} — ABNORMAL LOW (ref >59)
Glucose: 115 mg/dL — ABNORMAL HIGH (ref 65–99)
Potassium: 4.1 mmol/L (ref 3.5–5.2)
Sodium: 141 mmol/L (ref 134–144)

## 2019-10-12 ENCOUNTER — Ambulatory Visit (INDEPENDENT_AMBULATORY_CARE_PROVIDER_SITE_OTHER): Payer: Medicare Other

## 2019-10-12 ENCOUNTER — Other Ambulatory Visit: Payer: Self-pay

## 2019-10-12 DIAGNOSIS — T63441D Toxic effect of venom of bees, accidental (unintentional), subsequent encounter: Secondary | ICD-10-CM | POA: Diagnosis not present

## 2019-10-13 ENCOUNTER — Ambulatory Visit: Payer: Medicare Other | Attending: Physician Assistant | Admitting: Physical Therapy

## 2019-10-13 ENCOUNTER — Ambulatory Visit (HOSPITAL_COMMUNITY)
Admission: RE | Admit: 2019-10-13 | Discharge: 2019-10-13 | Disposition: A | Discharge status: Home / Self Care | Payer: Medicare Other | Source: Ambulatory Visit | Attending: Physician Assistant | Admitting: Physician Assistant

## 2019-10-13 ENCOUNTER — Encounter: Payer: Self-pay | Admitting: Physical Therapy

## 2019-10-13 ENCOUNTER — Ambulatory Visit (HOSPITAL_COMMUNITY)
Admission: RE | Admit: 2019-10-13 | Discharge: 2019-10-13 | Disposition: A | Discharge status: Home / Self Care | Payer: Medicare Other | Source: Ambulatory Visit | Attending: Thoracic Surgery (Cardiothoracic Vascular Surgery) | Admitting: Thoracic Surgery (Cardiothoracic Vascular Surgery)

## 2019-10-13 DIAGNOSIS — R262 Difficulty in walking, not elsewhere classified: Secondary | ICD-10-CM

## 2019-10-13 DIAGNOSIS — I714 Abdominal aortic aneurysm, without rupture: Secondary | ICD-10-CM | POA: Diagnosis not present

## 2019-10-13 DIAGNOSIS — I350 Nonrheumatic aortic (valve) stenosis: Secondary | ICD-10-CM | POA: Diagnosis not present

## 2019-10-13 DIAGNOSIS — Z01818 Encounter for other preprocedural examination: Secondary | ICD-10-CM | POA: Diagnosis not present

## 2019-10-13 LAB — BASIC METABOLIC PANEL
Anion gap: 9 (ref 5–15)
BUN: 23 mg/dL (ref 8–23)
CO2: 25 mmol/L (ref 22–32)
Calcium: 9.8 mg/dL (ref 8.9–10.3)
Chloride: 103 mmol/L (ref 98–111)
Creatinine, Ser: 1.26 mg/dL — ABNORMAL HIGH (ref 0.61–1.24)
GFR, Estimated: 50 mL/min — ABNORMAL LOW (ref >60)
Glucose, Bld: 119 mg/dL — ABNORMAL HIGH (ref 70–99)
Potassium: 4 mmol/L (ref 3.5–5.1)
Sodium: 137 mmol/L (ref 135–145)

## 2019-10-13 MED ORDER — SODIUM CHLORIDE 0.9 % WEIGHT BASED INFUSION
3.0000 mL/kg/h | INTRAVENOUS | Status: AC
  Administered 2019-10-13: 3 mL/kg/h via INTRAVENOUS

## 2019-10-13 MED ORDER — IOHEXOL 350 MG/ML SOLN
98.0000 mL | Freq: Once | INTRAVENOUS | Status: AC | PRN
  Administered 2019-10-13: 98 mL via INTRAVENOUS

## 2019-10-13 MED ORDER — SODIUM CHLORIDE 0.9 % WEIGHT BASED INFUSION
1.0000 mL/kg/h | INTRAVENOUS | Status: DC
  Administered 2019-10-13: 1 mL/kg/h via INTRAVENOUS

## 2019-10-13 NOTE — Therapy (Signed)
[facility] [place], [place], [address] Phone: [phone]   Fax:  [phone]  Physical Therapy Evaluation/Pre-TAVR  Patient Details  Name: [name] [name] MRN: [id number] Date of Birth: [birth year] Referring Provider (PT): Angelena Form PA-C   Encounter Date: 10/13/2019   PT End of Session - 10/13/19 1335    Visit Number 1    PT Start Time 1330    PT Stop Time 1400    PT Time Calculation (min) 30 min    Activity Tolerance Patient tolerated treatment well    Behavior During Therapy [facility] for tasks assessed/performed           Past Medical History:  Diagnosis Date  . AAA (abdominal aortic aneurysm) ([place])   . Acute coronary syndrome ([place]) 02/29/2012  . Acute systolic heart failure ([place]) 03/03/2012  . Angina   . Ascending aorta dilatation ([facility]) 03/13/2012   Fusiform dilatation of the ascending thoracic aorta discovered during surgery, max diameter 4.2-4.3 cm  . Atrial fibrillation ([place]) 02/29/2012   Recurrent paroxysmal, new-onset  . BPH (benign prostatic hypertrophy)   . CAD (coronary artery disease)    Cath April 2012 40% left main, 50% LAD, 40% OM, occluded RCA with L-R collaterals   . CHF (congestive heart failure) ([place])   . Chronic venous insufficiency   . Dyspnea   . Glaucoma   . Gout   . Hyperlipidemia   . Hypertension   . Kidney stones   . MI (myocardial infarction) ([place])   . Obesity (BMI 30-39.9)   . Paroxysmal atrial fibrillation ([facility]) 02/29/2012   Recurrent paroxysmal, new-onset   . PMR (polymyalgia rheumatica) ([facility])   . S/P CABG x 3 03/06/2012   LIMA to LAD, SVG to OM, SVG to RCA, EVH via right thigh  . S/P Maze operation for atrial fibrillation 03/06/2012   Complete bilateral lesions set using bipolar radiofrequency and cryothermy ablation with clipping of LA appendage  . Severe aortic stenosis 09/29/2019    Past Surgical History:  Procedure Laterality Date  . CARDIAC CATHETERIZATION    .  CATARACT EXTRACTION, BILATERAL    . CHOLECYSTECTOMY N/A 03/24/2012   Procedure: LAPAROSCOPIC CHOLECYSTECTOMY WITH INTRAOPERATIVE CHOLANGIOGRAM;  Surgeon: Zenovia Jarred, MD;  Location: [facility];  Service: General;  Laterality: N/A;  . CORONARY ARTERY BYPASS GRAFT N/A 03/06/2012   Procedure: CORONARY ARTERY BYPASS GRAFTING (CABG);  Surgeon: Rexene Alberts, MD;  Location: [place];  Service: Open Heart Surgery;  Laterality: N/A;  . ENDOVEIN HARVEST OF GREATER SAPHENOUS VEIN Right 03/06/2012   Procedure: ENDOVEIN HARVEST OF GREATER SAPHENOUS VEIN;  Surgeon: Rexene Alberts, MD;  Location: [facility];  Service: Open Heart Surgery;  Laterality: Right;  . INTRAOPERATIVE TRANSESOPHAGEAL ECHOCARDIOGRAM N/A 03/06/2012   Procedure: INTRAOPERATIVE TRANSESOPHAGEAL ECHOCARDIOGRAM;  Surgeon: Rexene Alberts, MD;  Location: [facility];  Service: Open Heart Surgery;  Laterality: N/A;  . LEFT HEART CATH Right 03/02/2012   Procedure: LEFT HEART CATH;  Surgeon: Sherren Mocha, MD;  Location: [facility] CATH LAB;  Service: Cardiovascular;  Laterality: Right;  Marland Kitchen MAZE N/A 03/06/2012   Procedure: MAZE;  Surgeon: Rexene Alberts, MD;  Location: [facility];  Service: Open Heart Surgery;  Laterality: N/A;  . PROSTATECTOMY    . RIGHT/LEFT HEART CATH AND CORONARY/GRAFT ANGIOGRAPHY N/A 09/29/2019   Procedure: RIGHT/LEFT HEART CATH AND CORONARY/GRAFT ANGIOGRAPHY;  Surgeon: Adrian Prows, MD;  Location: [facility] CV LAB;  Service: Cardiovascular;  Laterality: N/A;    There were no vitals  filed for this visit.    Subjective Assessment - 10/13/19 1333    Subjective Occasional chest pains, SOB, low energy. Trouble walking more than 100 yards. Symptoms have been building up about 10-12 years ago.    Currently in Pain? No/denies              [facility] PT Assessment - 10/13/19 0001      Assessment   Medical Diagnosis severe aortic stenosis    Referring Provider (PT) Angelena Form PA-C    Onset Date/Surgical Date --   10-12 years ago   Hand Dominance Right       Precautions   Precautions None      Restrictions   Weight Bearing Restrictions No      Balance Screen   Has the patient fallen in the past 6 months Yes    How many times? 1   felt dizzy and collapsed next to bed.    Has the patient had a decrease in activity level because of a fear of falling?  Yes    Is the patient reluctant to leave their home because of a fear of falling?  No      Home Ecologist residence    Living Arrangements Spouse/significant other      Prior Function   Level of Independence Independent    Leisure avid woodworker, yard work, Brewing technologist   Overall Cognitive Status Within Functional Limits for tasks assessed      Observation/Other Assessments   Focus on Therapeutic Outcomes (FOTO)  n/a TAVR      Sensation   Additional Comments Rt hand N/T especially in the AM      Posture/Postural Control   Posture Comments rounded shoulders & forward head with flattened thoracic kyphosis      ROM / Strength   AROM / PROM / Strength AROM;Strength      AROM   Overall AROM Comments WFL      Strength   Overall Strength Comments UE & Le gross 4+/5    Strength Assessment Site Hand    Right/Left hand Right;Left    Right Hand Grip (lbs) 30    Left Hand Grip (lbs) 25            [facility] Pre-Surgical Assessment - 10/13/19 0001    5 Meter Walk Test- trial 1 4 sec    5 Meter Walk Test- trial 2 4 sec.     5 Meter Walk Test- trial 3 4 sec.    5 meter walk test average 4 sec    4 Stage Balance Test Position 3    Sit To Stand Test- trial 1 46 sec.    6 Minute Walk- Baseline yes    BP (mmHg) 142/62    HR (bpm) 66    02 Sat (%RA) 99 %    Modified Borg Scale for Dyspnea 3- Moderate shortness of breath or breathing difficulty    Perceived Rate of Exertion (Borg) 6-    6 Minute Walk Post Test yes    BP (mmHg) 165/68    HR (bpm) 83    02 Sat (%RA) 98 %    Modified Borg Scale for Dyspnea 4- somewhat severe     Perceived Rate of Exertion (Borg) 13- Somewhat hard    Aerobic Endurance Distance Walked 1007    Endurance additional comments there are not age-related normative values for his age range- 26% disability compared to  59-89 y/o age group                     Objective measurements completed on examination: See above findings.                            Plan - 10/13/19 1403    PT Frequency One time visit    Consulted and Agree with Plan of Care Patient           Clinical Impression Statement: Pt is a 86 yo M presenting to OP PT for evaluation prior to possible TAVR surgery due to severe aortic stenosis. Pt reports onset of SOB and fatigue 10-12 years ago that has worsened over time. Symptoms are  limiting functional endurance in ADLs and leisure activities. Pt presents with WFL ROM and strength and denies musculoskeletal pain.   Pt ambulated a total of 1007 feet in 6 minute walk and reported 4/10 SOB on modified scale for dyspena and 13/20 RPE on Borg's perceived exertion and pain scale at the end of the walk. During the 6 minute walk test, patient's HR increased to 104 BPM and O2 saturation decreased to 96%. Based on the Short Physical Performance Battery, patient has a frailty rating of 8/12 with </= 5/12 considered frail.  Visit Diagnosis: Difficulty in walking, not elsewhere classified     Problem List Patient Active Problem List   Diagnosis Date Noted  . Severe aortic stenosis 09/29/2019  . Shortness of breath 08/29/2018  . Ascending aorta dilatation ([facility]) 04/25/2015  . OSA (obstructive sleep apnea) 11/18/2014  . Thoracic ascending aortic aneurysm ([place]) 09/29/2012  . S/P laparoscopic cholecystectomy 04/09/2012  . Cholecystitis, acute 03/22/2012  . S/P CABG x 3 03/06/2012  . S/P Maze operation for atrial fibrillation 03/06/2012  . Paroxysmal atrial fibrillation ([place]) 02/29/2012  . CAD (coronary artery disease), native coronary artery   .  Hyperlipidemia   . Obesity (BMI 30-39.9)   . Hypertension   . Chronic venous insufficiency   . Gout   . BPH (benign prostatic hypertrophy)     Gurley Climer C. Hennie Gosa PT, DPT 10/13/19 2:06 PM   [facility] [address] [place], [place], [address] Phone: [phone]   Fax:  [phone]  Name: JEISON DELPILAR MRN: [id number] Date of Birth: August 21, 1926

## 2019-10-14 ENCOUNTER — Encounter: Payer: Self-pay | Admitting: Physician Assistant

## 2019-10-20 ENCOUNTER — Encounter: Payer: Self-pay | Admitting: Thoracic Surgery (Cardiothoracic Vascular Surgery)

## 2019-10-20 ENCOUNTER — Institutional Professional Consult (permissible substitution): Payer: Medicare Other | Admitting: Thoracic Surgery (Cardiothoracic Vascular Surgery)

## 2019-10-20 ENCOUNTER — Other Ambulatory Visit: Payer: Self-pay

## 2019-10-20 VITALS — BP 100/62 | HR 77 | Temp 97.5°F | Resp 20 | Ht 73.0 in | Wt 245.0 lb

## 2019-10-20 DIAGNOSIS — I77810 Thoracic aortic ectasia: Secondary | ICD-10-CM

## 2019-10-20 DIAGNOSIS — Z951 Presence of aortocoronary bypass graft: Secondary | ICD-10-CM | POA: Diagnosis not present

## 2019-10-20 DIAGNOSIS — I350 Nonrheumatic aortic (valve) stenosis: Secondary | ICD-10-CM

## 2019-10-20 NOTE — Progress Notes (Addendum)
[facility]  CARDIOTHORACIC SURGERY CONSULTATION REPORT  Referring Provider is Adrian Prows, MD PCP is [name], MD  Chief Complaint  Patient presents with  . Consult    TAVR vs. SAVR, CTA/morph 10/13/19, ECHO 09/24/19, Cath 09/29/19    HPI:  Patient is an 86 year old male with history of coronary artery disease s/p CABGx3 in 2014, hypertension, atrial fibrillation on long term anticoagulation, s/p maze procedure in 2014 currently maintaining sinus rhythm, hyperlipidemia, type 2 diabetes mellitus, obstructive sleep apnea and polymyalgia rheumatica on long term prednisone who has been referred for surgical consultation to discuss treatment options for management of severe symptomatic aortic stenosis.  I first had the opportunity to care for the patient when he underwent coronary artery bypass grafting 3 and Maze procedure on 03/06/2012 for severe three-vessel coronary artery disease and recurrent paroxysmal atrial fibrillation.  At the time of his surgery his aortic valve was notably trileaflet with no aortic stenosis but mild leaflet sclerosis and mild aortic insufficiency.  He was also noted to have mild to moderate fusiform dilatation of the ascending thoracic aorta which we have followed intermittently ever since.  He was last seen here in our office in 2019 at which time the diameter of his ascending aorta remained stable.  The patient has been followed carefully for several years by Dr. Einar Gip, and follow-up echocardiograms have demonstrated the development of aortic stenosis that has slowly progressed in severity.  Most recent echocardiogram performed September 24, 2019 revealed low normal left ventricular systolic function with ejection fraction estimated 50 to 55%.  The aortic valve appeared trileaflet with severe aortic stenosis.  Peak and mean transvalvular gradients were reported 40 and 27 mmHg respectively, corresponding to aortic  valve area calculated 0.97 cm.  There was mild mitral regurgitation and mild to moderate tricuspid regurgitation.  Pulmonary artery pressures of.  Mildly elevated.  The patient was recently seen in follow-up by Dr. Einar Gip and complained of worsening symptoms of exertional shortness of breath and fatigue.  Diagnostic cardiac catheterization was performed September 29, 2019 and confirmed the presence of aortic stenosis with peak to peak and mean transvalvular gradients measured 41 and 33 mmHg respectively, corresponding to aortic valve area 1.09 cm.  Right heart pressures were very mildly elevated.  The patient was referred to the multidisciplinary heart valve clinic and underwent CT angiography.  Cardiothoracic surgical consultation was requested.  Patient is married and lives locally in [place] with his wife.  He has been retired for many years but remains active physically and functionally independent.  He complains that he has been slowing down over the past 2 to 3 years with worsening exertional shortness of breath and fatigue.  The patient now gets short of breath with moderate and occasionally low level activity.  He frequently has to stop and take breaks.  He sometimes has tightness across his chest for which she uses nitroglycerin.  He has had some dizzy spells and one episode where he frankly collapsed without losing consciousness.  He denies resting shortness of breath, PND, or orthopnea.  He has chronic lower extremity edema.  Appetite is good and weight is stable.  He ambulates without need for mechanical assistance although he does report his balance is mildly off.  He has not had any mechanical falls.  Past Medical History:  Diagnosis Date  . AAA (abdominal aortic aneurysm) ([place])   . Acute coronary syndrome ([place]) 02/29/2012  . Acute systolic heart failure ([place]) 03/03/2012  .  Angina   . Ascending aorta dilatation ([facility]) 03/13/2012   Fusiform dilatation of the ascending thoracic aorta discovered  during surgery, max diameter 4.2-4.3 cm  . Atrial fibrillation ([place]) 02/29/2012   Recurrent paroxysmal, new-onset  . BPH (benign prostatic hypertrophy)   . CAD (coronary artery disease)    Cath April 2012 40% left main, 50% LAD, 40% OM, occluded RCA with L-R collaterals   . CHF (congestive heart failure) ([place])   . Chronic venous insufficiency   . Glaucoma   . Gout   . Hyperlipidemia   . Hypertension   . Kidney stones   . MI (myocardial infarction) ([place])   . Obesity (BMI 30-39.9)   . Paroxysmal atrial fibrillation ([facility]) 02/29/2012   Recurrent paroxysmal, new-onset   . PMR (polymyalgia rheumatica) ([facility])   . S/P CABG x 3 03/06/2012   LIMA to LAD, SVG to OM, SVG to RCA, EVH via right thigh  . S/P Maze operation for atrial fibrillation 03/06/2012   Complete bilateral lesions set using bipolar radiofrequency and cryothermy ablation with clipping of LA appendage  . Severe aortic stenosis 09/29/2019    Past Surgical History:  Procedure Laterality Date  . CARDIAC CATHETERIZATION    . CATARACT EXTRACTION, BILATERAL    . CHOLECYSTECTOMY N/A 03/24/2012   Procedure: LAPAROSCOPIC CHOLECYSTECTOMY WITH INTRAOPERATIVE CHOLANGIOGRAM;  Surgeon: Zenovia Jarred, MD;  Location: [facility];  Service: General;  Laterality: N/A;  . CORONARY ARTERY BYPASS GRAFT N/A 03/06/2012   Procedure: CORONARY ARTERY BYPASS GRAFTING (CABG);  Surgeon: Rexene Alberts, MD;  Location: [facility];  Service: Open Heart Surgery;  Laterality: N/A;  . ENDOVEIN HARVEST OF GREATER SAPHENOUS VEIN Right 03/06/2012   Procedure: ENDOVEIN HARVEST OF GREATER SAPHENOUS VEIN;  Surgeon: Rexene Alberts, MD;  Location: [facility];  Service: Open Heart Surgery;  Laterality: Right;  . INTRAOPERATIVE TRANSESOPHAGEAL ECHOCARDIOGRAM N/A 03/06/2012   Procedure: INTRAOPERATIVE TRANSESOPHAGEAL ECHOCARDIOGRAM;  Surgeon: Rexene Alberts, MD;  Location: [facility];  Service: Open Heart Surgery;  Laterality: N/A;  . LEFT HEART CATH Right 03/02/2012   Procedure: LEFT HEART CATH;   Surgeon: Sherren Mocha, MD;  Location: [facility] CATH LAB;  Service: Cardiovascular;  Laterality: Right;  Marland Kitchen MAZE N/A 03/06/2012   Procedure: MAZE;  Surgeon: Rexene Alberts, MD;  Location: [facility];  Service: Open Heart Surgery;  Laterality: N/A;  . PROSTATECTOMY    . RIGHT/LEFT HEART CATH AND CORONARY/GRAFT ANGIOGRAPHY N/A 09/29/2019   Procedure: RIGHT/LEFT HEART CATH AND CORONARY/GRAFT ANGIOGRAPHY;  Surgeon: Adrian Prows, MD;  Location: [facility];  Service: Cardiovascular;  Laterality: N/A;    Family History  Problem Relation Age of Onset  . Dementia Mother   . Stroke Brother     Social History   Socioeconomic History  . Marital status: Married    Spouse name: Not on file  . Number of children: 4  . Years of education: Not on file  . Highest education level: Not on file  Occupational History  . 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 Smoking status: Former Smoker    Packs/day: 0.50    Years: 15.00    Pack years: 7.50    Types: Cigarettes    Quit date: 11/18/1962    Years since quitting: 56.9  . Smokeless tobacco: Never Used  Vaping Use  . Vaping Use: Never used  Substance and Sexual Activity  . Alcohol use: Yes    Alcohol/week: 2.0 standard drinks    Types:  2 Shots of liquor per week    Comment: daily  . Drug use: Never  . Sexual activity: Yes  Other Topics Concern  . Not on file  Social History Narrative   Lives with wife in [place].     Very active physically without significant limitations.    Exercises regularly   Social Determinants of Health   Financial Resource Strain:   . Difficulty of Paying Living Expenses: Not on file  Food Insecurity:   . Worried About Charity fundraiser in the Last Year: Not on file  . Ran Out of Food in the Last Year: Not on file  Transportation Needs:   . Lack of Transportation (Medical): Not on file  . Lack of Transportation (Non-Medical): Not on file  Physical Activity:   . Days of Exercise per  Week: Not on file  . Minutes of Exercise per Session: Not on file  Stress:   . Feeling of Stress : Not on file  Social Connections:   . Frequency of Communication with Friends and Family: Not on file  . Frequency of Social Gatherings with Friends and Family: Not on file  . Attends Religious Services: Not on file  . Active Member of Clubs or Organizations: Not on file  . Attends Archivist Meetings: Not on file  . Marital Status: Not on file  Intimate Partner Violence:   . Fear of Current or Ex-Partner: Not on file  . Emotionally Abused: Not on file  . Physically Abused: Not on file  . Sexually Abused: Not on file    Current Outpatient Medications  Medication Sig Dispense Refill  . acetaminophen (TYLENOL) 650 MG CR tablet Take 650 mg by mouth 2 (two) times daily.    Marland Kitchen allopurinol (ZYLOPRIM) 300 MG tablet Take 150 mg by mouth Daily.    Marland Kitchen amLODipine (NORVASC) 5 MG tablet TAKE 1 TABLET BY MOUTH DAILY (Patient taking differently: Take 5 mg by mouth every evening. ) 90 tablet 3  . cholecalciferol (VITAMIN D) 1000 UNITS tablet Take 1,000 Units by mouth daily.    Marland Kitchen EPINEPHrine 0.3 mg/0.3 mL IJ SOAJ injection Inject 0.3 mLs (0.3 mg total) into the muscle as needed for anaphylaxis. 2 each 2  . fish oil-omega-3 fatty acids 1000 MG capsule Take 1 g by mouth daily.     . furosemide (LASIX) 40 MG tablet TAKE 1 TABLET BY MOUTH EVERY DAY (Patient taking differently: Take 40 mg by mouth daily. ) 90 tablet 2  . galantamine (RAZADYNE ER) 8 MG 24 hr capsule Take 8 mg by mouth Daily.    . HONEY BEE VENOM IJ Inject as directed See admin instructions. Every eight weeks    . isosorbide mononitrate (IMDUR) 60 MG 24 hr tablet TAKE 1 TABLET(60 MG) BY MOUTH DAILY AFTER SUPPER (Patient taking differently: Take 60 mg by mouth every evening. ) 30 tablet 1  . memantine (NAMENDA) 10 MG tablet Take 10 mg by mouth 2 (two) times daily.    Marland Kitchen NITROSTAT 0.4 MG SL tablet Place 0.4 mg under the tongue every 5 (five)  minutes x 3 doses as needed for chest pain.     . pantoprazole (PROTONIX) 40 MG tablet Take 40 mg by mouth every other day.   4  . potassium chloride (MICRO-K) 10 MEQ CR capsule TAKE 1 CAPSULE BY MOUTH DAILY WITH LASIX (Patient taking differently: Take 10 mEq by mouth daily. ) 90 capsule 1  . predniSONE (DELTASONE) 1 MG tablet Take 1  mg by mouth daily with breakfast.    . PRESCRIPTION MEDICATION Cpap    . Psyllium Husk POWD Take 1 Dose by mouth daily.    . rivaroxaban (XARELTO) 20 MG TABS tablet Take 20 mg by mouth daily.    . rosuvastatin (CRESTOR) 20 MG tablet Take 10 mg by mouth daily.    Marland Kitchen telmisartan (MICARDIS) 40 MG tablet Take 20 mg by mouth daily.     Marland Kitchen triamcinolone cream (KENALOG) 0.1 % Apply 1 application topically as needed (skin irritation).     . vitamin B-12 (CYANOCOBALAMIN) 1000 MCG tablet Take 1,000 mcg by mouth daily.     Marland Kitchen ZETIA 10 MG tablet Take 10 mg by mouth Daily.      No current facility-administered medications for this visit.    Allergies  Allergen Reactions  . Amoxicillin Other (See Comments)    Headache, debilitated  . Bee Venom Anaphylaxis  . Morphine And Related Other (See Comments)    hypotension      Review of Systems:   General:  normal appetite, decreased energy, no weight gain, no weight loss, no fever  Cardiac:  + chest pain with exertion, + chest pain at rest, +SOB with exertion, no resting SOB, no PND, no orthopnea, no palpitations, no arrhythmia, no atrial fibrillation, + LE edema, + dizzy spells, no syncope  Respiratory:  + shortness of breath, no home oxygen, no productive cough, no dry cough, no bronchitis, no wheezing, no hemoptysis, no asthma, no pain with inspiration or cough, + sleep apnea, no CPAP at night  GI:   no difficulty swallowing, no reflux, no frequent heartburn, no hiatal hernia, no abdominal pain, no constipation, no diarrhea, no hematochezia, no hematemesis, no melena  GU:   no dysuria,  no frequency, no urinary tract  infection, no hematuria, + enlarged prostate, no kidney stones, no kidney disease  Vascular:  no pain suggestive of claudication, no pain in feet, no leg cramps, no varicose veins, no DVT, no non-healing foot ulcer  Neuro:   no stroke, no TIA's, no seizures, no headaches, no temporary blindness one eye,  no slurred speech, no peripheral neuropathy, no chronic pain, mild instability of gait, mild memory/cognitive dysfunction  Musculoskeletal: + arthritis, no joint swelling, no myalgias, mild difficulty walking, slightly decreased mobility   Skin:   no rash, + itching, no skin infections, no pressure sores or ulcerations  Psych:   no anxiety, no depression, no nervousness, no unusual recent stress  Eyes:   no blurry vision, no floaters, + recent vision changes, + wears glasses or contacts  ENT:   + hearing loss, no loose or painful teeth, no dentures, last saw dentist 6 weeks ago  Hematologic:  + easy bruising, + abnormal bleeding, no clotting disorder, no frequent epistaxis  Endocrine:  no diabetes, does not check CBG's at home           Physical Exam:   BP 100/62 (BP Location: Right Arm, Patient Position: Sitting)   Pulse 77   Temp (!) 97.5 F (36.4 C)   Resp 20   Ht 6\' 1"  (1.854 m)   Wt 245 lb (111.1 kg)   SpO2 96% Comment: RA with mask on  BMI 32.32 kg/m   General:  Moderately obese, elderly but well-appearing  HEENT:  Unremarkable no  Neck:   no JVD, no bruits, no adenopathy no  Chest:   clear to auscultation, symmetrical breath sounds, no wheezes, no rhonchi no  CV:   RRR,  grade III/VI crescendo/decrescendo murmur heard best at RSB,  no diastolic murmur  Abdomen:  soft, non-tender, no masses   Extremities:  warm, well-perfused, pulses not palpable, 3+ bilateral LE edema  Rectal/GU  Deferred  Neuro:   Grossly non-focal and symmetrical throughout  Skin:   Clean and dry, no rashes, no breakdown   Diagnostic Tests:  Echocardiogram 09/24/2019:  Normal LV systolic function  with visual EF 50-55%. Left ventricle cavity  is normal in size. Moderate concentric hypertrophy of the left ventricle.  Normal global wall motion. Doppler evidence of grade II (pseudonormal)  diastolic dysfunction, elevated LAP.  Left atrial cavity is mildly dilated.  Trileaflet aortic valve. Severe aortic stenosis. Mild (Grade I) aortic  regurgitation. Moderate aortic valve leaflet calcification. Severely  restricted aortic valve leaflets. Aortic valve peak pressure gradient of  40 and mean gradient of 60mmHg, calculated aortic valve area 0.97 cm.  Mild calcification of the mitral valve annulus. Structurally normal mitral  valve. Mild (Grade I) mitral regurgitation.  Structurally normal tricuspid valve. Mild to moderate tricuspid  regurgitation. Mild pulmonary hypertension. RVSP measures 37 mmHg.  IVC is dilated with respiratory variation. This suggest elevated right  heart pressure (est 8 mm Hg).  Compared to the study done on 03/05/2019, no significant change in peak or  mean pressure gradient. LVEF appears to have slightly improved from 45%.    RIGHT/LEFT HEART CATH AND CORONARY/GRAFT ANGIOGRAPHY  Conclusion  Right and left heart catheterization 09/29/2019: Right heart catheterization RA 8/8, mean 6 mmHg.  RV 34/0, EDP 8 mmHg.;  PA 31/11, mean 18 mmHg.  PA saturation 62%.;  PW 13/25, mean 11 mmHg.  Aortic saturation 100%. Cardiac output 5.57, cardiac index 2.38, normal.  Normal SVR and PVR. LV: 1/4, EDP 14 mmHg.  Peak to peak aortic valve gradient was 41 with a mean gradient of 33.3 with a valve area of 1.09 cm.  Left heart catheterization Left main is calcific 50% to 60% stenosis distally and involves ostium of the large circumflex with a ostial 80% stenosis.  Gives origin to large OM1, OM 2. SVG to OM1 is widely patent and retrogradely fills the entire circumflex.  There is a 70 to 75% stenosis proximal to the graft insertion. LAD: Gives origin to large D1 and D2, mild  diffuse disease, subtotally occluded to occluded in the midsegment. LIMA to LAD: Widely patent. RCA: Dominant and occluded in the proximal segment. SVG to RCA: Widely patent.  Impression: Moderate to severe aortic stenosis, symptomatic.  Patient will benefit from aortic valve replacement and consideration for TAVR.  110 mL contrast utilized, fluoroscopy time 42.8 minutes. Recommendations  Antiplatelet/Anticoag Recommend Aspirin 81mg  daily for moderate CAD.  Indications  Severe aortic stenosis [I35.0 (ICD-10-CM)]  Dyspnea on exertion [R06.00 (ICD-10-CM)]  Procedural Details  Technical Details Pressure performed: Right heart catheterization, left heart catheterization including LV hemodynamics and pressure pullback across the stenotic aortic valve, need to left and right coronary arteriogram, saphenous vein graft and left internal mammary arteriogram.  Indication: CHOICE KLEINSASSER  is a 86 y.o. Caucasian male  with vitamin B12 deficiency, mild dementia, hyperlipidemia, hypertension, CAD, chronic stable angina status post CABG x 3 and Maze and LAA amputation in 2014, paroxysmal atrial fibrillation, obstructive sleep apnea not currently on CPAP, moderate aortic stenosis with thoracic aortic aneurysm and dilation of ascending aorta and follows Dr. Lilly Cove (No correlation with TTE with regards to aneurysm), venous stasis edema.  Recently has been having worsening dyspnea on exertion.  Outpatient echocardiogram  revealed moderate to severe aortic stenosis.  In view of symptomatic nature of his disease, progression of aortic stenosis, is brought to the cardiac catheterization lab to evaluate coronary anatomy prior to consideration for aortic valve replacement.  Technique: Sterile precautions utilized, left antecubital vein access utilized for performing right heart catheterization and a balloontipped Swan-Ganz was advanced after accessing the left AC with 5 [place] sheath.  Right-sided hemodynamics  performed and catheter pulled out of body.  Left radial arterial access obtained, I used multiple catheters to access the native left main including AL-1, JL4, and in view of significant difficulty, I exchanged the short left radial arterial access sheath to a 75 cm 6 [place] destination sheath and I was able to complete the procedure.  I was also able to cross the left ventricle with AL-1 catheter and then placed a pigtail catheter to perform hemodynamics.  All cath exchanges were done over the J-wire.  Hemostasis obtained by applying TR band.  Manual pressure held for venous access. Estimated blood loss between 50 mL and 150 mL.   During this procedure medications were administered to achieve and maintain moderate conscious sedation while the patient's heart rate, blood pressure, and oxygen saturation were continuously monitored and I was present face-to-face 100% of this time.  Medications (Filter: Administrations occurring from [phone] to 0929 on 09/29/19) (important) Continuous medications are totaled by the amount administered until 09/29/19 0929.  Heparin (Porcine) in NaCl 1000-0.9 UT/500ML-% SOLN (mL) Total volume:  1,500 mL Date/Time  Rate/Dose/Volume Action  09/29/19 0735  500 mL Given  0735  500 mL Given  0735  500 mL Given    midazolam (VERSED) injection (mg) Total dose:  2 mg Date/Time  Rate/Dose/Volume Action  09/29/19 0747  1 mg Given  0833  1 mg Given    fentaNYL (SUBLIMAZE) injection (mcg) Total dose:  50 mcg Date/Time  Rate/Dose/Volume Action  09/29/19 0748  25 mcg Given  0833  25 mcg Given    lidocaine (PF) (XYLOCAINE) 1 % injection (mL) Total volume:  4 mL Date/Time  Rate/Dose/Volume Action  09/29/19 0750  2 mL Given  0804  2 mL Given    Radial Cocktail/Verapamil only (mL) Total volume:  10 mL Date/Time  Rate/Dose/Volume Action  09/29/19 0805  5 mL Given  0845  5 mL Given    heparin sodium (porcine) injection (Units) Total dose:  5,000 Units Date/Time   Rate/Dose/Volume Action  09/29/19 0812  5,000 Units Given    iohexol (OMNIPAQUE) 350 MG/ML injection (mL) Total volume:  110 mL Date/Time  Rate/Dose/Volume Action  09/29/19 0918  110 mL Given    Sedation Time  Sedation Time Physician-1: 1 hour 31 minutes 31 seconds  Contrast  Medication Name Total Dose  iohexol (OMNIPAQUE) 350 MG/ML injection 110 mL    Radiation/Fluoro  Fluoro time: 42.8 (min) DAP: 119147 (mGycm2) Cumulative Air Kerma: 8295 (mGy)  Coronary Findings  Diagnostic Dominance: Right Left Main  Ost LM to Mid LM lesion is 70% stenosed.  Left Anterior Descending  Mid LAD lesion is 99% stenosed. The lesion is ulcerative.  Left Circumflex  Ost Cx to Prox Cx lesion is 85% stenosed.  First Obtuse Marginal Branch  1st Mrg lesion is 75% stenosed.  Right Coronary Artery  Prox RCA lesion is 100% stenosed.  Graft To Dist RCA  Graft To 1st Mrg  LIMA Graft To Dist LAD  Intervention  No interventions have been documented. Right Heart  Right Heart Pressures LV EDP is normal.  Left Heart  Left Ventricle LV end diastolic pressure is normal.  Coronary Diagrams  Diagnostic Dominance: Right  Intervention  Implants   No implant documentation for this case.  Syngo Images  Show images for CARDIAC CATHETERIZATION Images on Long Term Storage  Show images for Omega, Slager to Procedure Log  Procedure Log    Hemo Data   Most Recent Value  Fick Cardiac Output 5.57 L/min  Fick Cardiac Output Index 2.38 (L/min)/BSA  Aortic Mean Gradient 33.34 mmHg  Aortic Peak Gradient 41 mmHg  Aortic Valve Area 1.09  Aortic Value Area Index 0.47 cm2/BSA  RA A Wave 8 mmHg  RA V Wave 8 mmHg  RA Mean 6 mmHg  RV Systolic Pressure 34 mmHg  RV Diastolic Pressure 0 mmHg  RV EDP 8 mmHg  PA Systolic Pressure 31 mmHg  PA Diastolic Pressure 11 mmHg  PA Mean 18 mmHg  PW A Wave 14 mmHg  PW V Wave 26 mmHg  PW Mean 13 mmHg  AO Systolic Pressure 269 mmHg  AO Diastolic  Pressure 44 mmHg  AO Mean 65 mmHg  LV Systolic Pressure 485 mmHg  LV Diastolic Pressure 4 mmHg  LV EDP 14 mmHg  AOp Systolic Pressure 462 mmHg  AOp Diastolic Pressure 53 mmHg  AOp Mean Pressure 69 mmHg  LVp Systolic Pressure 703 mmHg  LVp Diastolic Pressure 3 mmHg  LVp EDP Pressure 13 mmHg  QP/QS 1  TPVR Index 7.55 HRUI  TSVR Index 27.25 HRUI  PVR SVR Ratio 0.12  TPVR/TSVR Ratio 0.28      Cardiac TAVR CT  TECHNIQUE: The patient was scanned on a Siemens Force 500 slice scanner. A 120 kV retrospective scan was triggered in the descending thoracic aorta at 111 HU's. Gantry rotation speed was 270 msecs and collimation was .9 mm. No beta blockade or nitro were given. The 3D data set was reconstructed in 5% intervals of the R-R cycle. Systolic and diastolic phases were analyzed on a dedicated work station using MPR, MIP and VRT modes. The patient received 80 cc of contrast.  FINDINGS: Aortic Valve:  Aorta: Moderate dilatation of the ascending thoracic aorta, normal arch vessels with moderate calcific atherosclerosis  Sinotubular Junction: 31 mm  Ascending Thoracic Aorta: Moderately dilated 44 mm  Aortic Arch: 25 mm  Descending Thoracic Aorta: 24 mm  Sinus of Valsalva Measurements:  Non-coronary: 37 mm  Right - coronary: 35.3 mm  Left - coronary: 35.3 mm  Coronary Artery Height above Annulus:  Left Main: 12.6 mm above annulus  Right Coronary: 17.8 mm above annulus  Virtual Basal Annulus Measurements:  Maximum/Minimum Diameter: 29.7 mm x 23.1 mm  Perimeter: 84 mm  Area: 540.6 mm 2  Coronary Arteries: Sufficient height above annulus for deployment Patent SVG to RCA, Patent SVG to OM1, and patent LIMA to LAD  Optimum Fluoroscopic Angle for Delivery: LAO 16 Caudal 23 degrees  IMPRESSION: 1. Tri- leaflet AV with annular area 540 mm2 upper limit for 26 mm Sapien 3 valve Despite large sinuses commissural calcium may suggest smaller  valve better than 29 mm valve  2. Optimum angiographic angle for deployment LAO 16 Caudal 23 degrees  3.  Moderate aortic root dilatation 4.4 cm  4. Coronary arteries sufficient height above annulus for deployment with patent SVG to RCA/OM1 and patent LIMA to LAD  5.  LAA has been clipped with no contrast flow into the appendage  [place]   Electronically Signed   By: Eligha Bridegroom.D.  On: 10/13/2019 12:57     CT ANGIOGRAPHY CHEST, ABDOMEN AND PELVIS  TECHNIQUE: Multidetector CT imaging through the chest, abdomen and pelvis was performed using the standard protocol during bolus administration of intravenous contrast. Multiplanar reconstructed images and MIPs were obtained and reviewed to evaluate the vascular anatomy.  CONTRAST:  44mL OMNIPAQUE IOHEXOL 350 MG/ML SOLN  COMPARISON:  CTA chest 04/29/2017.  FINDINGS: CTA CHEST FINDINGS  Cardiovascular: Heart size is borderline enlarged. There is no significant pericardial fluid, thickening or pericardial calcification. There is aortic atherosclerosis, as well as atherosclerosis of the great vessels of the mediastinum and the coronary arteries, including calcified atherosclerotic plaque in the left main, left anterior descending, left circumflex and right coronary arteries. Status post median sternotomy for CABG including LIMA to the LAD. Thickening calcification of the aortic valve.  Mediastinum/Lymph Nodes: No pathologically enlarged mediastinal or hilar lymph nodes. Please note that accurate exclusion of hilar adenopathy is limited on noncontrast CT scans. Esophagus is unremarkable in appearance. No axillary lymphadenopathy.  Lungs/Pleura: No suspicious appearing pulmonary nodules or masses are noted. No acute consolidative airspace disease. No pleural effusions.  Musculoskeletal/Soft Tissues: Median sternotomy wires. There are no aggressive appearing lytic or blastic lesions noted in  the visualized portions of the skeleton.  CTA ABDOMEN AND PELVIS FINDINGS  Hepatobiliary: No suspicious cystic or solid hepatic lesions. No intra or extrahepatic biliary ductal dilatation. Status post cholecystectomy.  Pancreas: No pancreatic mass. No pancreatic ductal dilatation. No pancreatic or peripancreatic fluid collections or inflammatory changes.  Spleen: Unremarkable.  Adrenals/Urinary Tract: Low-attenuation lesions in the kidneys bilaterally, compatible with simple cysts, largest of which is in the upper pole of the right kidney measuring 3 cm in diameter. Mild bilateral renal atrophy with mild diffuse cortical thinning. No suspicious renal lesions. Bilateral adrenal glands are normal in appearance. No hydroureteronephrosis. Urinary bladder is normal in appearance.  Stomach/Bowel: Normal appearance of the stomach. No pathologic dilatation of small bowel or colon. Numerous colonic diverticulae are noted, without surrounding inflammatory changes to suggest an acute diverticulitis at this time. Normal appendix.  Vascular/Lymphatic: Aortic atherosclerosis, with vascular findings and measurements pertinent to potential TAVR procedure, as detailed below. In addition, there is multifocal aneurysmal dilatation and short segment dissections of both the abdominal aorta and the common iliac arteries bilaterally. Multifocal fusiform aneurysmal dilatation of the infrarenal abdominal aorta measuring up to 3.8 x 3.1 cm proximally and 4.1 x 3.0 cm more distally, with multiple short segment dissection flaps within these regions. Aneurysmal dilatation of the common iliac arteries measuring 1.9 cm in diameter on the right and 1.6 cm in diameter on the left. No lymphadenopathy noted in the abdomen or pelvis.  Reproductive: Prostate gland and seminal vesicles are unremarkable in appearance.  Other: No significant volume of ascites.  No pneumoperitoneum.  Musculoskeletal:  There are no aggressive appearing lytic or blastic lesions noted in the visualized portions of the skeleton.  VASCULAR MEASUREMENTS PERTINENT TO TAVR:  AORTA:  Minimal Aortic Diameter-15 x 11 mm  Severity of Aortic Calcification-severe  RIGHT PELVIS:  Right Common Iliac Artery -  Minimal Diameter-7.9 x 7.1 mm  Tortuosity-mild  Calcification-moderate to severe  Comment-aneurysmally dilated with short segment dissection  Right External Iliac Artery -  Minimal Diameter-9.6 x 8.4 mm  Tortuosity-severe  Calcification-moderate to severe  Right Common Femoral Artery -  Minimal Diameter-7.5 x 7.5 mm  Tortuosity-mild  Calcification-moderate to severe  LEFT PELVIS:  Left Common Iliac Artery -  Minimal Diameter-7.9 x 6.5 mm  Tortuosity-mild  Calcification-moderate to severe  Comment-aneurysmally dilated with short segment dissection  Left External Iliac Artery -  Minimal Diameter-9.6 x 7.6 mm  Tortuosity-moderate  Calcification-moderate to severe  Left Common Femoral Artery -  Minimal Diameter-8.7 x 6.1 mm  Tortuosity-mild  Calcification-moderate to severe  Review of the MIP images confirms the above findings.  IMPRESSION: 1. Vascular findings and measurements pertinent to potential TAVR procedure, as detailed above. Please note the presence of multiple aneurysms and short-segment dissections involving the infrarenal abdominal aorta as well as the common iliac arteries bilaterally. 2. Severe thickening calcification of the aortic valve, compatible with the reported clinical history of severe aortic stenosis. 3. Aortic atherosclerosis, in addition to left main and 3 vessel coronary artery disease. Status post median sternotomy for CABG including LIMA to the LAD. 4. Colonic diverticulosis without evidence of acute diverticulitis at this time. 5. Additional incidental findings, as above.   Electronically  Signed   By: Vinnie Langton M.D.   On: 10/13/2019 12:59    EKG: NSR w/ RBBB and left infra-fascicular bi-fascicular block (09/23/2019)    STS Risk Calculator  Procedure: Isolated AVR   Risk of Mortality: 6.557%  Renal Failure: 9.470%  Permanent Stroke: 2.461%  Prolonged Ventilation: 17.501%  DSW Infection: 0.418%  Reoperation: 4.549%  Morbidity or Mortality: 22.327%  Short Length of Stay: 10.095%  Long Length of Stay: 21.293%      Impression:  Patient has stage D3 paradoxical low flow with preserved ejection fraction severe symptomatic aortic stenosis.  He describes progressive symptoms of exertional shortness of breath and fatigue that are likely multifactorial but nonetheless consistent with chronic diastolic congestive heart failure, New York Heart Association functional class III.  I have personally reviewed both images and the report from the patient's most recent transthoracic echocardiogram performed at Dr. Irven Shelling office.  Left ventricular systolic function appears low normal with ejection fraction characterize 50 to 55%.  The aortic valve is trileaflet with moderate thickening and severely restricted leaflet mobility involving all 3 leaflets of the aortic valve.  There is at least mild aortic insufficiency and pressure half-time was reported 339 ms.  Peak velocity across the aortic valve was measured 3.2 m/s corresponding to peak and mean transvalvular gradients estimated 39 and 24 mmHg respectively.  Aortic valve area was calculated 0.97 square by VTI.  Stroke-volume index and DVI were not reported.  Diagnostic cardiac catheterization performed September 29, 2019 confirmed the presence of aortic stenosis with peak to peak and mean transvalvular gradients measured 41 and 33 mmHg respectively.  Right heart pressures were minimally elevated.  Diagnostic cardiac catheterization revealed severe left main and three-vessel coronary artery disease but continued patency of all 3  bypass grafts placed at the time of the patient's bypass surgery in 2014.  I agree the patient would benefit from aortic valve replacement.  However, I would not consider this 86 year old gentleman to be a candidate for for conventional surgery under any circumstances due to his extremely advanced age, previous coronary artery bypass surgery with patent grafts, numerous comorbid medical problems, and progressively declining physical mobility and functional status.  Cardiac-gated CTA of the heart reveals anatomical characteristics consistent with aortic stenosis suitable for treatment by transcatheter aortic valve replacement without any significant complicating features.  CTA of the aorta and iliac vessels demonstrates no significant enlargement of the mildly dilated proximal ascending aorta but diffuse atherosclerotic disease throughout the descending aorta with a small infrarenal abdominal aortic aneurysm and multiple areas with localized chronic dissections which would  preclude pelvic vascular access for transfemoral approach.  The patient's left subclavian and left axillary arteries appear large enough and free of significant disease such that left trans-subclavian approach appears feasible for transcatheter heart valve deployment with minimal associated risk of disruption or occlusion of the left internal mammary artery graft.  Baseline EKG reveals sinus rhythm or sinus bradycardia with chronic right bundle branch block and partial left infra fascicular block such that the patient will be at somewhat increased risk for the development of complete heart block following transcatheter heart valve deployment.    Plan:  The patient and his wife were counseled at length regarding treatment alternatives for management of severe symptomatic aortic stenosis. Alternative approaches such as conventional aortic valve replacement, transcatheter aortic valve replacement, and continued medical therapy without  intervention were compared and contrasted at length.  The risks associated with conventional surgical aortic valve replacement were discussed in detail, as were expectations for post-operative convalescence, and why I would be reluctant to consider this patient a candidate for conventional surgery.  Issues specific to transcatheter aortic valve replacement were discussed including questions about long term valve durability, the potential for paravalvular leak, possible increased risk of need for permanent pacemaker placement, and other technical complications related to the procedure itself.  Long-term prognosis with medical therapy was discussed. This discussion was placed in the context of the patient's own specific clinical presentation and past medical history.  All of their questions have been addressed.  The patient desires to proceed with transcatheter aortic valve replacement via left subclavian approach as soon as practical.  We plan to proceed on Tuesday, October 27, 2019.  Following the decision to proceed with transcatheter aortic valve replacement, a discussion has been held regarding what types of management strategies would be attempted intraoperatively in the event of life-threatening complications, including whether or not the patient would be considered a candidate for the use of cardiopulmonary bypass and/or conversion to open sternotomy for attempted surgical intervention.  The patient specifically requests that should a potentially life-threatening complication develop we would not attempt emergency median sternotomy and/or other aggressive surgical procedures.  The patient has been advised of a variety of complications that might develop including but not limited to risks of death, stroke, paravalvular leak, aortic dissection or other major vascular complications, aortic annulus rupture, device embolization, cardiac rupture or perforation, mitral regurgitation, acute myocardial infarction,  arrhythmia, heart block or bradycardia requiring permanent pacemaker placement, congestive heart failure, respiratory failure, renal failure, pneumonia, infection, other late complications related to structural valve deterioration or migration, or other complications that might ultimately cause a temporary or permanent loss of functional independence or other long term morbidity.  The patient provides full informed consent for the procedure as described and all questions were answered.      I spent in excess of 90 minutes during the conduct of this office consultation and >50% of this time involved direct face-to-face encounter with the patient for counseling and/or coordination of their care.      Valentina Gu. Roxy Manns, MD 10/20/2019 3:49 PM

## 2019-10-20 NOTE — Patient Instructions (Addendum)
Stop taking Xarelto on Thursday 10/21  Continue taking all other medications without change through the day before surgery.  Make sure to bring all of your medications with you when you come for your Pre-Admission Testing appointment at [facility] Short-Stay Department.  Have nothing to eat or drink after midnight the night before surgery.  On the morning of surgery take only Protonix with a sip of water.  At your appointment for Pre-Admission Testing at the [facility] Short-Stay Department you will be asked to sign permission forms for your upcoming surgery.  By definition your signature on these forms implies that you and/or your designee provide full informed consent for your planned surgical procedure(s), that alternative treatment options have been discussed, that you understand and accept any and all potential risks, and that you have some understanding of what to expect for your post-operative convalescence.  For any major cardiac surgical procedure potential operative risks include but are not limited to at least some risk of death, stroke or other neurologic complication, myocardial infarction, congestive heart failure, respiratory failure, renal failure, bleeding requiring blood transfusion and/or reexploration, irregular heart rhythm, heart block or bradycardia requiring permanent pacemaker, pneumonia, pericardial effusion, pleural effusion, wound infection, pulmonary embolus or other thromboembolic complication, chronic pain, or other complications related to the specific procedure(s) performed.  For transcatheter aortic valve replacement additional risks include but are not limited to risk of paravalvular leak, valve embolization, valve thrombosis, aortic dissection, aortic rupture, ventricular septal defect or perforation, pericardial tamponade, injury of the abdominal aorta or its branches, and/or injury or occlusion of the arteries going to your arms or  legs.  Please call to schedule a follow-up appointment in our office prior to surgery if you have any unresolved questions about your planned surgical procedure, the associated risks, alternative treatment options, and/or expectations for your post-operative recovery.

## 2019-10-21 ENCOUNTER — Encounter: Payer: Medicare Other | Admitting: Thoracic Surgery (Cardiothoracic Vascular Surgery)

## 2019-10-21 ENCOUNTER — Other Ambulatory Visit: Payer: Self-pay

## 2019-10-21 DIAGNOSIS — I350 Nonrheumatic aortic (valve) stenosis: Secondary | ICD-10-CM

## 2019-10-21 NOTE — Progress Notes (Signed)
[facility] [place], [address] [place] [address] [place] [address] Phone: [phone] Fax: [phone]  [facility] [phone] - [address], [place] [address] [place] [address] Phone: [phone] Fax: [phone]      Your procedure is scheduled on Tuesday, 10/27/19.  Report to [facility] Main Entrance "A" at 7:15 A.M., and check in at the Admitting office.  Call this number if you have problems the morning of surgery:  [phone]  Call [phone] if you have any questions prior to your surgery date Monday-Friday 8am-4pm    Remember:  Do not eat after midnight the night before your surgery-Monday     Take these medicines the morning of surgery with A SIP OF WATER : Protonix  As of today, STOP taking any Aspirin (unless otherwise instructed by your surgeon) Aleve, Naproxen, Ibuprofen, Motrin, Advil, Goody's, BC's, all herbal medications, fish oil, and all vitamins.                      Do not wear jewelry            Do not wear lotions, powders,colognes, or deodorant.            Men may shave face and neck.            Do not bring valuables to the hospital.            [facility] is not responsible for any belongings or valuables.  Do NOT Smoke (Tobacco/Vaping) or drink Alcohol 24 hours prior to your procedure If you use a CPAP at night, you may bring all equipment for your overnight stay.   Contacts, glasses, dentures or bridgework may not be worn into surgery.      For patients admitted to the hospital, discharge time will be determined by your treatment team.   Patients discharged the day of surgery will not be allowed to drive home, and someone needs to stay with them for 24 hours.    Special instructions:   [facility]- Preparing For Surgery  Before surgery, you can play an important  role. Because skin is not sterile, your skin needs to be as free of germs as possible. You can reduce the number of germs on your skin by washing with CHG (chlorahexidine gluconate) Soap before surgery.  CHG is an antiseptic cleaner which kills germs and bonds with the skin to continue killing germs even after washing.    Oral Hygiene is also important to reduce your risk of infection.  Remember - BRUSH YOUR TEETH THE MORNING OF SURGERY WITH YOUR REGULAR TOOTHPASTE  Please do not use if you have an allergy to CHG or antibacterial soaps. If your skin becomes reddened/irritated stop using the CHG.  Do not shave (including legs and underarms) for at least 48 hours prior to first CHG shower. It is OK to shave your face.  Please follow these instructions carefully.   1. Shower the NIGHT BEFORE SURGERY-Mon and the MORNING OF SURGERY-Tues with CHG Soap.   2. If you chose to wash your hair, wash your hair first as usual with your normal shampoo.  3. After you shampoo, rinse your hair and body thoroughly to remove the shampoo.  4. Use CHG as you would any other liquid soap. You can apply  CHG directly to the skin and wash gently with a scrungie or a clean washcloth.   5. Apply the CHG Soap to your body ONLY FROM THE NECK DOWN.  Do not use on open wounds or open sores. Avoid contact with your eyes, ears, mouth and genitals (private parts). Wash Face and genitals (private parts)  with your normal soap.   6. Wash thoroughly, paying special attention to the area where your surgery will be performed.  7. Thoroughly rinse your body with warm water from the neck down.  8. DO NOT shower/wash with your normal soap after using and rinsing off the CHG Soap.  9. Pat yourself dry with a CLEAN TOWEL.  10. Wear CLEAN PAJAMAS to bed the night before surgery  11. Place CLEAN SHEETS on your bed the night of your first shower and DO NOT SLEEP WITH PETS.   Day of Surgery: Wear Clean/Comfortable clothing the  morning of surgery Do not apply any deodorants/lotions.   Remember to brush your teeth WITH YOUR REGULAR TOOTHPASTE.   Please read over the following fact sheets that you were given.

## 2019-10-22 ENCOUNTER — Encounter (HOSPITAL_COMMUNITY)
Admission: RE | Admit: 2019-10-22 | Discharge: 2019-10-22 | Disposition: A | Discharge status: Home / Self Care | Payer: Medicare Other | Source: Ambulatory Visit | Attending: Cardiovascular Disease | Admitting: Cardiovascular Disease

## 2019-10-22 ENCOUNTER — Other Ambulatory Visit: Payer: Self-pay

## 2019-10-22 ENCOUNTER — Encounter (HOSPITAL_COMMUNITY): Payer: Self-pay

## 2019-10-22 ENCOUNTER — Ambulatory Visit (HOSPITAL_COMMUNITY)
Admission: RE | Admit: 2019-10-22 | Discharge: 2019-10-22 | Disposition: A | Discharge status: Home / Self Care | Payer: Medicare Other | Source: Ambulatory Visit | Attending: Cardiovascular Disease | Admitting: Cardiovascular Disease

## 2019-10-22 DIAGNOSIS — I2510 Atherosclerotic heart disease of native coronary artery without angina pectoris: Secondary | ICD-10-CM | POA: Insufficient documentation

## 2019-10-22 DIAGNOSIS — Z87891 Personal history of nicotine dependence: Secondary | ICD-10-CM | POA: Diagnosis not present

## 2019-10-22 DIAGNOSIS — Z951 Presence of aortocoronary bypass graft: Secondary | ICD-10-CM | POA: Insufficient documentation

## 2019-10-22 DIAGNOSIS — I252 Old myocardial infarction: Secondary | ICD-10-CM | POA: Diagnosis not present

## 2019-10-22 DIAGNOSIS — I110 Hypertensive heart disease with heart failure: Secondary | ICD-10-CM | POA: Insufficient documentation

## 2019-10-22 DIAGNOSIS — Z952 Presence of prosthetic heart valve: Secondary | ICD-10-CM | POA: Diagnosis not present

## 2019-10-22 DIAGNOSIS — Z79899 Other long term (current) drug therapy: Secondary | ICD-10-CM | POA: Insufficient documentation

## 2019-10-22 DIAGNOSIS — Z01818 Encounter for other preprocedural examination: Secondary | ICD-10-CM | POA: Insufficient documentation

## 2019-10-22 DIAGNOSIS — Z7901 Long term (current) use of anticoagulants: Secondary | ICD-10-CM | POA: Diagnosis not present

## 2019-10-22 DIAGNOSIS — I350 Nonrheumatic aortic (valve) stenosis: Secondary | ICD-10-CM

## 2019-10-22 DIAGNOSIS — M47814 Spondylosis without myelopathy or radiculopathy, thoracic region: Secondary | ICD-10-CM | POA: Diagnosis not present

## 2019-10-22 DIAGNOSIS — G4733 Obstructive sleep apnea (adult) (pediatric): Secondary | ICD-10-CM | POA: Insufficient documentation

## 2019-10-22 DIAGNOSIS — I509 Heart failure, unspecified: Secondary | ICD-10-CM | POA: Insufficient documentation

## 2019-10-22 DIAGNOSIS — I480 Paroxysmal atrial fibrillation: Secondary | ICD-10-CM | POA: Diagnosis not present

## 2019-10-22 DIAGNOSIS — I517 Cardiomegaly: Secondary | ICD-10-CM | POA: Diagnosis not present

## 2019-10-22 HISTORY — DX: Personal history of urinary calculi: Z87.442

## 2019-10-22 HISTORY — DX: Gastro-esophageal reflux disease without esophagitis: K21.9

## 2019-10-22 HISTORY — DX: Unspecified osteoarthritis, unspecified site: M19.90

## 2019-10-22 HISTORY — DX: Headache, unspecified: R51.9

## 2019-10-22 HISTORY — DX: Sleep apnea, unspecified: G47.30

## 2019-10-22 HISTORY — DX: Prediabetes: R73.03

## 2019-10-22 LAB — SURGICAL PCR SCREEN
MRSA, PCR: NEGATIVE
Staphylococcus aureus: NEGATIVE

## 2019-10-22 LAB — COMPREHENSIVE METABOLIC PANEL
ALT: 14 U/L (ref 0–44)
AST: 17 U/L (ref 15–41)
Albumin: 3.7 g/dL (ref 3.5–5.0)
Alkaline Phosphatase: 42 U/L (ref 38–126)
Anion gap: 11 (ref 5–15)
BUN: 25 mg/dL — ABNORMAL HIGH (ref 8–23)
CO2: 24 mmol/L (ref 22–32)
Calcium: 9.7 mg/dL (ref 8.9–10.3)
Chloride: 105 mmol/L (ref 98–111)
Creatinine, Ser: 1.43 mg/dL — ABNORMAL HIGH (ref 0.61–1.24)
GFR, Estimated: 46 mL/min — ABNORMAL LOW (ref >60)
Glucose, Bld: 98 mg/dL (ref 70–99)
Potassium: 4.1 mmol/L (ref 3.5–5.1)
Sodium: 140 mmol/L (ref 135–145)
Total Bilirubin: 0.8 mg/dL (ref 0.3–1.2)
Total Protein: 6.6 g/dL (ref 6.5–8.1)

## 2019-10-22 LAB — APTT: aPTT: 32 seconds (ref 24–36)

## 2019-10-22 LAB — BLOOD GAS, ARTERIAL
Acid-Base Excess: 2.3 mmol/L — ABNORMAL HIGH (ref 0.0–2.0)
Bicarbonate: 26.1 mmol/L (ref 20.0–28.0)
FIO2: 21
O2 Saturation: 97.7 %
Patient temperature: 37
pCO2 arterial: 38.9 mmHg (ref 32.0–48.0)
pH, Arterial: 7.442 (ref 7.350–7.450)
pO2, Arterial: 101 mmHg (ref 83.0–108.0)

## 2019-10-22 LAB — CBC
HCT: 34.2 % — ABNORMAL LOW (ref 39.0–52.0)
Hemoglobin: 10.7 g/dL — ABNORMAL LOW (ref 13.0–17.0)
MCH: 30.8 pg (ref 26.0–34.0)
MCHC: 31.3 g/dL (ref 30.0–36.0)
MCV: 98.6 fL (ref 80.0–100.0)
Platelets: 178 10*3/uL (ref 150–400)
RBC: 3.47 MIL/uL — ABNORMAL LOW (ref 4.22–5.81)
RDW: 14 % (ref 11.5–15.5)
WBC: 7.3 10*3/uL (ref 4.0–10.5)
nRBC: 0 % (ref 0.0–0.2)

## 2019-10-22 LAB — PROTIME-INR
INR: 1.2 (ref 0.8–1.2)
Prothrombin Time: 15 seconds (ref 11.4–15.2)

## 2019-10-22 LAB — GLUCOSE, CAPILLARY: Glucose-Capillary: 127 mg/dL — ABNORMAL HIGH (ref 70–99)

## 2019-10-22 LAB — BRAIN NATRIURETIC PEPTIDE: B Natriuretic Peptide: 118.3 pg/mL — ABNORMAL HIGH (ref 0.0–100.0)

## 2019-10-22 LAB — URINALYSIS, ROUTINE W REFLEX MICROSCOPIC
Bilirubin Urine: NEGATIVE
Glucose, UA: NEGATIVE mg/dL
Hgb urine dipstick: NEGATIVE
Ketones, ur: NEGATIVE mg/dL
Leukocytes,Ua: NEGATIVE
Nitrite: NEGATIVE
Protein, ur: NEGATIVE mg/dL
Specific Gravity, Urine: 1.016 (ref 1.005–1.030)
pH: 6 (ref 5.0–8.0)

## 2019-10-22 LAB — TYPE AND SCREEN
ABO/RH(D): O POS
Antibody Screen: NEGATIVE

## 2019-10-22 NOTE — Progress Notes (Signed)
PCP - Dr. Abner Greenspan Cardiologist - Dr. Einar Gip  Chest x-ray - today EKG - today ECHO - 09/24/19 Cardiac Cath - 09/29/19  Sleep Study - 2016 in Epic CPAP - yes  Fasting Blood Sugar - no Checks Blood Sugar ___0__ times a day - pt states he is Pre-diabetic  Blood Thinner Instructions: Last dose Xarelto was 10/21/19  COVID TEST- scheduled for 10/24/19   Anesthesia review: yes, Heart hx  Patient denies shortness of breath, fever, cough and chest pain at PAT appointment   All instructions explained to the patient, with a verbal understanding of the material. Patient agrees to go over the instructions while at home for a better understanding. Patient also instructed to self quarantine after being tested for COVID-19. The opportunity to ask questions was provided.

## 2019-10-22 NOTE — Pre-Procedure Instructions (Addendum)
[name]  10/22/2019    Your procedure is scheduled on Tuesday, October 27, 2019 at 9:15 AM.   Report to [facility] Entrance "A" Admitting Office at 7:15 AM.   Call this number if you have problems the morning of surgery: [phone]   Questions prior to day of surgery, please call [phone] between 8 & 4 PM.   Remember:  Do not eat or drink after midnight Monday, 10/26/19.  Take these medicines the morning of surgery with A SIP OF WATER: Pantoprazole (Protonix)  Stop Xarelto as of today prior to surgery per instructions of Dr. Burt Knack. Stop Fish Oil, Vitamins and Herbal medications as of today prior to surgery. Do not use any Aspirin containing products or NSAIDS (Ibuprofen, Aleve, etc) prior to surgery.    Do not wear jewelry.  Do not wear lotions, powders, cologne or deodorant.  Men may shave face and neck.  Do not bring valuables to the hospital.  [facility] is not responsible for any belongings or valuables.  Contacts, dentures or bridgework may not be worn into surgery.  Leave your suitcase in the car.  After surgery it may be brought to your room.  For patients admitted to the hospital, discharge time will be determined by your treatment team.  [facility] - Preparing for Surgery  Before surgery, you can play an important role.  Because skin is not sterile, your skin needs to be as free of germs as possible.  You can reduce the number of germs on you skin by washing with CHG (chlorahexidine gluconate) soap before surgery.  CHG is an antiseptic cleaner which kills germs and bonds with the skin to continue killing germs even after washing.  Oral Hygiene is also important in reducing the risk of infection.  Remember to brush your teeth with your regular toothpaste the morning of surgery.  Please DO NOT use if you have an allergy to CHG or antibacterial soaps.  If your skin becomes reddened/irritated stop using the CHG and inform your nurse when you arrive at  Short Stay.  Do not shave (including legs and underarms) for at least 48 hours prior to the first CHG shower.  You may shave your face.  Please follow these instructions carefully:   1.  Shower with CHG Soap the night before surgery and the morning of Surgery.  2.  If you choose to wash your hair, wash your hair first as usual with your normal shampoo.  3.  After you shampoo, rinse your hair and body thoroughly to remove the shampoo. 4.  Use CHG as you would any other liquid soap.  You can apply chg directly to the skin and wash gently with a      scrungie or washcloth.           5.  Apply the CHG Soap to your body ONLY FROM THE NECK DOWN.   Do not use on open wounds or open sores. Avoid contact with your eyes, ears, mouth and genitals (private parts).  Wash genitals (private parts) with your normal soap - do this prior to using CHG soap.  6.  Wash thoroughly, paying special attention to the area where your surgery will be performed.  7.  Thoroughly rinse your body with warm water from the neck down.  8.  DO NOT shower/wash with your normal soap after using and rinsing off the CHG Soap.  9.  Pat yourself dry with a clean towel.  10.  Wear clean pajamas.            11.  Place clean sheets on your bed the night of your first shower and do not sleep with pets.  Day of Surgery  Shower as above. Do not apply any lotions/deodorants the morning of surgery.   Please wear clean clothes to the hospital. Remember to brush your teeth with toothpaste.  Please read over the fact sheets that you were given.

## 2019-10-23 ENCOUNTER — Encounter: Payer: Self-pay | Admitting: Vascular Surgery

## 2019-10-23 ENCOUNTER — Encounter: Payer: Self-pay | Admitting: Thoracic Surgery (Cardiothoracic Vascular Surgery)

## 2019-10-23 LAB — HEMOGLOBIN A1C
Hgb A1c MFr Bld: 5.9 % — ABNORMAL HIGH (ref 4.8–5.6)
Mean Plasma Glucose: 123 mg/dL

## 2019-10-23 NOTE — Progress Notes (Addendum)
Anesthesia Chart Review:  Case: [id number] Date/Time: 10/27/19 0900   Procedures:      TRANSCATHETER AORTIC VALVE REPLACEMENT, LEFT SUBCLAVIAN (Left Chest)     TRANSESOPHAGEAL ECHOCARDIOGRAM (TEE) (N/A )   Anesthesia type: General   Pre-op diagnosis: Severe Aortic Stenosis   Location: [facility] OR ROOM 16 / [place] [facility]   Surgeons: Sherren Mocha, MD    CT Surgeon: Darylene Price, MD  DISCUSSION: Patient is a 86 year old male scheduled for the above procedure.  History includes former smoker (quit 11/17/12), CAD (angina, MI, s/p CABG/MAZE/LAA clipping  03/06/12: LIMA-LAD, SVG-OM1, SVG-dRCA), afib (s/p DCCV 09/11/18), severe AS, CHF, HTN, HLD, GERD, pre-diabetes, chronic venous insufficiency, OSA (CPAP), polymyalgia rheumatica, glaucoma, cholecystitis (03/24/12), BPH (s/p TURP 05/26/10), AAA (4.1 cm, infrarenal AAA, 1.9 cm RCIA, 1.6 cm LCIA 10/13/19 CTA). Labs suggest a degree of CKD. BMI is consistent with obesity.  Last Xarelto 10/21/2019.  10/22/2019 presurgical CXR is in process.  He is for presurgical COVID-19 test on 10/24/2019.  Anesthesia team to evaluate on the day of surgery. By medication list, he is on chronic prednisone (4 mg daily).   VS: BP 120/60   Pulse 66   Temp 36.4 C (Oral)   Resp 19   Ht 6\' 1"  (1.854 m)   Wt 112.1 kg   SpO2 99%   BMI 32.60 kg/m   PROVIDERS: [name], MD is PCP  Adrian Prows, MD is cardiologist Chesley Mires, MD is pulmonologist   LABS: Labs reviewed: Acceptable for surgery. Cr 1.43, previously 1.26-1.45 since 09/2018. (all labs ordered are listed, but only abnormal results are displayed)  Labs Reviewed  GLUCOSE, CAPILLARY - Abnormal; Notable for the following components:      Result Value   Glucose-Capillary 127 (*)    All other components within normal limits  BLOOD GAS, ARTERIAL - Abnormal; Notable for the following components:   Acid-Base Excess 2.3 (*)    Allens test (pass/fail) BRACHIAL ARTERY (*)    All other components within normal limits   BRAIN NATRIURETIC PEPTIDE - Abnormal; Notable for the following components:   B Natriuretic Peptide 118.3 (*)    All other components within normal limits  CBC - Abnormal; Notable for the following components:   RBC 3.47 (*)    Hemoglobin 10.7 (*)    HCT 34.2 (*)    All other components within normal limits  COMPREHENSIVE METABOLIC PANEL - Abnormal; Notable for the following components:   BUN 25 (*)    Creatinine, Ser 1.43 (*)    GFR, Estimated 46 (*)    All other components within normal limits  HEMOGLOBIN A1C - Abnormal; Notable for the following components:   Hgb A1c MFr Bld 5.9 (*)    All other components within normal limits  SURGICAL PCR SCREEN  APTT  PROTIME-INR  URINALYSIS, ROUTINE W REFLEX MICROSCOPIC  TYPE AND SCREEN   PFTs 10/16/18: FVC 2.88 (73%), post 3.12 (79%). FEV1 2.01 (74%), post 2.13 (78%). DLCO unc 23.62 (96%).   EKG: 10/22/19: Normal sinus rhythm Left axis deviation Right bundle branch block Abnormal ECG   CV/IMAGES: See surgical consult note by Dr. Roxy Manns for summary of recent diagnostic tests.    Past Medical History:  Diagnosis Date  . AAA (abdominal aortic aneurysm) ([place])   . Acute coronary syndrome ([place]) 02/29/2012  . Acute systolic heart failure ([place]) 03/03/2012  . Angina   . Arthritis   . Ascending aorta dilatation ([facility]) 03/13/2012   Fusiform dilatation of the ascending thoracic aorta discovered during  surgery, max diameter 4.2-4.3 cm  . Atrial fibrillation ([place]) 02/29/2012   Recurrent paroxysmal, new-onset  . BPH (benign prostatic hypertrophy)   . CAD (coronary artery disease)    Cath April 2012 40% left main, 50% LAD, 40% OM, occluded RCA with L-R collaterals   . CHF (congestive heart failure) ([place])   . Chronic venous insufficiency   . GERD (gastroesophageal reflux disease)   . Glaucoma   . Gout   . Headache    MIGRAINES in the past  . History of kidney stones   . Hyperlipidemia   . Hypertension   . MI (myocardial infarction) ([place])    . Obesity (BMI 30-39.9)   . Paroxysmal atrial fibrillation ([facility]) 02/29/2012   Recurrent paroxysmal, new-onset   . PMR (polymyalgia rheumatica) ([facility])   . Pre-diabetes   . S/P CABG x 3 03/06/2012   LIMA to LAD, SVG to OM, SVG to RCA, EVH via right thigh  . S/P Maze operation for atrial fibrillation 03/06/2012   Complete bilateral lesions set using bipolar radiofrequency and cryothermy ablation with clipping of LA appendage  . Severe aortic stenosis 09/29/2019  . Sleep apnea    USES CPAP NIGHTLY    Past Surgical History:  Procedure Laterality Date  . CARDIAC CATHETERIZATION    . CATARACT EXTRACTION, BILATERAL     with lens implants  . CHOLECYSTECTOMY N/A 03/24/2012   Procedure: LAPAROSCOPIC CHOLECYSTECTOMY WITH INTRAOPERATIVE CHOLANGIOGRAM;  Surgeon: Zenovia Jarred, MD;  Location: [place];  Service: General;  Laterality: N/A;  . CORONARY ARTERY BYPASS GRAFT N/A 03/06/2012   Procedure: CORONARY ARTERY BYPASS GRAFTING (CABG);  Surgeon: Rexene Alberts, MD;  Location: [place];  Service: Open Heart Surgery;  Laterality: N/A;  . ENDOVEIN HARVEST OF GREATER SAPHENOUS VEIN Right 03/06/2012   Procedure: ENDOVEIN HARVEST OF GREATER SAPHENOUS VEIN;  Surgeon: Rexene Alberts, MD;  Location: [place];  Service: Open Heart Surgery;  Laterality: Right;  . INTRAOPERATIVE TRANSESOPHAGEAL ECHOCARDIOGRAM N/A 03/06/2012   Procedure: INTRAOPERATIVE TRANSESOPHAGEAL ECHOCARDIOGRAM;  Surgeon: Rexene Alberts, MD;  Location: [place];  Service: Open Heart Surgery;  Laterality: N/A;  . LEFT HEART CATH Right 03/02/2012   Procedure: LEFT HEART CATH;  Surgeon: Sherren Mocha, MD;  Location[facility] CATH LAB;  Service: Cardiovascular;  Laterality: Right;  Marland Kitchen MAZE N/A 03/06/2012   Procedure: MAZE;  Surgeon: Rexene Alberts, MD;  Location: [place];  Service: Open Heart Surgery;  Laterality: N/A;  . PROSTATECTOMY     partial  . RIGHT/LEFT HEART CATH AND CORONARY/GRAFT ANGIOGRAPHY N/A 09/29/2019   Procedure: RIGHT/LEFT HEART CATH AND CORONARY/GRAFT  ANGIOGRAPHY;  Surgeon: Adrian Prows, MD;  Location: [place] CV LAB;  Service: Cardiovascular;  Laterality: N/A;    MEDICATIONS: . acetaminophen (TYLENOL) 500 MG tablet  . acetaminophen (TYLENOL) 650 MG CR tablet  . allopurinol (ZYLOPRIM) 300 MG tablet  . amLODipine (NORVASC) 5 MG tablet  . cholecalciferol (VITAMIN D) 1000 UNITS tablet  . EPINEPHrine 0.3 mg/0.3 mL IJ SOAJ injection  . fish oil-omega-3 fatty acids 1000 MG capsule  . furosemide (LASIX) 40 MG tablet  . galantamine (RAZADYNE ER) 8 MG 24 hr capsule  . HONEY BEE VENOM IJ  . isosorbide mononitrate (IMDUR) 60 MG 24 hr tablet  . memantine (NAMENDA) 10 MG tablet  . NITROSTAT 0.4 MG SL tablet  . pantoprazole (PROTONIX) 40 MG tablet  . potassium chloride (MICRO-K) 10 MEQ CR capsule  . predniSONE (DELTASONE) 1 MG tablet  . PRESCRIPTION MEDICATION  . Psyllium Husk  POWD  . rivaroxaban (XARELTO) 20 MG TABS tablet  . rosuvastatin (CRESTOR) 20 MG tablet  . telmisartan (MICARDIS) 40 MG tablet  . triamcinolone cream (KENALOG) 0.1 %  . vitamin B-12 (CYANOCOBALAMIN) 1000 MCG tablet  . ZETIA 10 MG tablet   No current facility-administered medications for this encounter.   Myra Gianotti, PA-C Surgical Short Stay/Anesthesiology [place] Phone [phone] [facility] -  Phone [phone] 10/23/2019 12:37 PM

## 2019-10-23 NOTE — Anesthesia Preprocedure Evaluation (Deleted)
Anesthesia Evaluation    Airway        Dental   Pulmonary former smoker,           Cardiovascular hypertension,      Neuro/Psych    GI/Hepatic   Endo/Other    Renal/GU      Musculoskeletal   Abdominal   Peds  Hematology   Anesthesia Other Findings   Reproductive/Obstetrics                             Anesthesia Physical Anesthesia Plan  ASA:   Anesthesia Plan:    Post-op Pain Management:    Induction:   PONV Risk Score and Plan:   Airway Management Planned:   Additional Equipment:   Intra-op Plan:   Post-operative Plan:   Informed Consent:   Plan Discussed with:   Anesthesia Plan Comments: (PAT note written 10/23/2019 by Myra Gianotti, PA-C. )        Anesthesia Quick Evaluation

## 2019-10-24 ENCOUNTER — Other Ambulatory Visit (HOSPITAL_COMMUNITY)
Admission: RE | Admit: 2019-10-24 | Discharge: 2019-10-24 | Disposition: A | Discharge status: Home / Self Care | Payer: Medicare Other | Source: Ambulatory Visit | Attending: Cardiovascular Disease | Admitting: Cardiovascular Disease

## 2019-10-24 DIAGNOSIS — Z01812 Encounter for preprocedural laboratory examination: Secondary | ICD-10-CM | POA: Diagnosis not present

## 2019-10-24 DIAGNOSIS — Z20822 Contact with and (suspected) exposure to covid-19: Secondary | ICD-10-CM | POA: Insufficient documentation

## 2019-10-24 LAB — SARS CORONAVIRUS 2 (TAT 6-24 HRS): SARS Coronavirus 2: NEGATIVE

## 2019-10-26 MED ORDER — DEXMEDETOMIDINE HCL IN NACL 400 MCG/100ML IV SOLN
0.1000 ug/kg/h | INTRAVENOUS | Status: DC
  Filled 2019-10-26 (×2): qty 100

## 2019-10-26 MED ORDER — POTASSIUM CHLORIDE 2 MEQ/ML IV SOLN
80.0000 meq | INTRAVENOUS | Status: DC
  Filled 2019-10-26 (×2): qty 40

## 2019-10-26 MED ORDER — LEVOFLOXACIN IN D5W 500 MG/100ML IV SOLN
500.0000 mg | INTRAVENOUS | Status: DC
  Filled 2019-10-26 (×2): qty 100

## 2019-10-26 MED ORDER — VANCOMYCIN HCL 1500 MG/300ML IV SOLN
1500.0000 mg | INTRAVENOUS | Status: DC
  Filled 2019-10-26 (×2): qty 300

## 2019-10-26 MED ORDER — SODIUM CHLORIDE 0.9 % IV SOLN
INTRAVENOUS | Status: DC
  Filled 2019-10-26 (×2): qty 30

## 2019-10-26 MED ORDER — MAGNESIUM SULFATE 50 % IJ SOLN
40.0000 meq | INTRAMUSCULAR | Status: DC
  Filled 2019-10-26 (×2): qty 9.85

## 2019-10-26 MED ORDER — NOREPINEPHRINE 4 MG/250ML-% IV SOLN
0.0000 ug/min | INTRAVENOUS | Status: DC
  Filled 2019-10-26 (×2): qty 250

## 2019-10-27 ENCOUNTER — Other Ambulatory Visit: Payer: Self-pay

## 2019-10-27 ENCOUNTER — Encounter (HOSPITAL_COMMUNITY): Admission: RE | Payer: Medicare Other | Source: Home / Self Care

## 2019-10-27 ENCOUNTER — Other Ambulatory Visit: Payer: Self-pay | Admitting: Physician Assistant

## 2019-10-27 ENCOUNTER — Inpatient Hospital Stay (HOSPITAL_COMMUNITY): Admission: RE | Admit: 2019-10-27 | Payer: Medicare Other | Source: Home / Self Care | Admitting: Cardiovascular Disease

## 2019-10-27 ENCOUNTER — Ambulatory Visit (HOSPITAL_COMMUNITY): Payer: Medicare Other | Attending: Cardiovascular Disease

## 2019-10-27 DIAGNOSIS — I350 Nonrheumatic aortic (valve) stenosis: Secondary | ICD-10-CM

## 2019-10-27 LAB — ECHOCARDIOGRAM COMPLETE
AR max vel: 1.27 cm2
AV Area VTI: 1.37 cm2
AV Area mean vel: 1.33 cm2
AV Mean grad: 28.8 mmHg
AV Peak grad: 52.1 mmHg
Ao pk vel: 3.61 m/s
Area-P 1/2: 2.52 cm2
P 1/2 time: 577 msec
S' Lateral: 2.2 cm

## 2019-10-27 SURGERY — IMPLANTATION, AORTIC VALVE, TRANSCATHETER, SUBCLAVIAN ARTERY APPROACH
Anesthesia: General | Site: Chest

## 2019-10-27 NOTE — Progress Notes (Signed)
I can call the insurance and discuss as well. He is very symptomatic JG

## 2019-10-28 ENCOUNTER — Other Ambulatory Visit: Payer: Self-pay

## 2019-10-28 ENCOUNTER — Encounter: Payer: Self-pay | Admitting: Cardiovascular Disease

## 2019-10-28 ENCOUNTER — Ambulatory Visit: Payer: Medicare Other | Admitting: Cardiovascular Disease

## 2019-10-28 VITALS — BP 142/62 | HR 77 | Ht 73.0 in | Wt 245.0 lb

## 2019-10-28 DIAGNOSIS — I350 Nonrheumatic aortic (valve) stenosis: Secondary | ICD-10-CM

## 2019-10-28 NOTE — Patient Instructions (Addendum)
Medication Instructions:  No changes *If you need a refill on your cardiac medications before your next appointment, please call your pharmacy*   Lab Work: none If you have labs (blood work) drawn today and your tests are completely normal, you will receive your results only by: Marland Kitchen MyChart Message (if you have MyChart) OR . A paper copy in the mail If you have any lab test that is abnormal or we need to change your treatment, we will call you to review the results.   Testing/Procedures: none   Follow-Up: Please call if any changes in your symptoms before your next visit.  Other Instructions

## 2019-10-28 NOTE — Progress Notes (Signed)
[facility] Consult Note  Chief Complaint  Patient presents with  . New Patient (Initial Visit)    severe aortic stenosis   History of Present Illness: 86 yo male with history of CAD s/p 3V CABG, HTN, atrial fibrillation s/p maze procedure, HLD, DM2, sleep apnea and polymyalgia rheumatica who is here today to discuss his severe aortic stenosis and possible TAVR. His aortic stenosis has been followed by Dr. Einar Gip. Echo in September 2021 showed LVEF=50-55%. The aortic valve appeared to have severe stenosis with thickened, calcified leaflets with limited leaflet excursion. Mean gradient was 27 mmHg with peak gradient 40 mmHg and AVA 0.97cm2. This was felt to represent paradoxical low flow, low gradient severe AS. There was mild MR and mild to moderate TR. The patient has complained of worsened exertional dyspnea and fatigue felt to be due to his aortic stenosis. He was referred to see Dr. Roxy Manns on our TAVR team in the CT surgery office and TAVR workup was completed. Cardiac catheterization was performed on 09/29/19 with mean gradient of 33 mHg, peak to peak gradient 41 mmHg and AVA 1.09 cm2. He had three vessel native vessel disease but all three bypass grafts were patent. Gated cardiac CTA with anatomy suitable for a 26 mm Edwards Sapient 3 Ultra valve.There are dissections in his distal aorta and iliofemoral vessel which would preclude vascular access from the transfemoral approach. A left subclavian approach would be used. Echo 10/27/19 with LVEF=60-65%. The aortic valve leaflets are thickened and calcified with limited leaflet excursion. Mean gradient 28.8 mmHg, peak gradient 52.1 mmHg, AVA 1.27 cm2, dimensionless index 0.26.   He tells me today that he continues to have dyspnea with minimal exertion and ongoing fatigue. He becomes dyspneic when speaking. Any exertion is limiting now. He has occasional chest pain. He has chronic lower extremity edema which is unchanged. He is married and lives  in [place] with his wife. He is retired. He is originally from [place]. He has no active dental issues.   Primary Care Physician: [name], MD Primary Cardiologist: Einar Gip Referring Cardiologist: Einar Gip  Past Medical History:  Diagnosis Date  . AAA (abdominal aortic aneurysm) ([place])   . Acute coronary syndrome ([place]) 02/29/2012  . Acute systolic heart failure ([place]) 03/03/2012  . Angina   . Arthritis   . Ascending aorta dilatation ([facility]) 03/13/2012   Fusiform dilatation of the ascending thoracic aorta discovered during surgery, max diameter 4.2-4.3 cm  . Atrial fibrillation ([place]) 02/29/2012   Recurrent paroxysmal, new-onset  . BPH (benign prostatic hypertrophy)   . CAD (coronary artery disease)    Cath April 2012 40% left main, 50% LAD, 40% OM, occluded RCA with L-R collaterals   . CHF (congestive heart failure) ([place])   . Chronic venous insufficiency   . GERD (gastroesophageal reflux disease)   . Glaucoma   . Gout   . Headache    MIGRAINES in the past  . History of kidney stones   . Hyperlipidemia   . Hypertension   . MI (myocardial infarction) ([place])   . Obesity (BMI 30-39.9)   . Paroxysmal atrial fibrillation ([facility]) 02/29/2012   Recurrent paroxysmal, new-onset   . PMR (polymyalgia rheumatica) ([facility])   . Pre-diabetes   . S/P CABG x 3 03/06/2012   LIMA to LAD, SVG to OM, SVG to RCA, EVH via right thigh  . S/P Maze operation for atrial fibrillation 03/06/2012   Complete bilateral lesions set using bipolar radiofrequency and cryothermy ablation with clipping of LA appendage  .  Severe aortic stenosis 09/29/2019  . Sleep apnea    USES CPAP NIGHTLY    Past Surgical History:  Procedure Laterality Date  . CARDIAC CATHETERIZATION    . CATARACT EXTRACTION, BILATERAL     with lens implants  . CHOLECYSTECTOMY N/A 03/24/2012   Procedure: LAPAROSCOPIC CHOLECYSTECTOMY WITH INTRAOPERATIVE CHOLANGIOGRAM;  Surgeon: Zenovia Jarred, MD;  Location: [facility];  Service: General;  Laterality: N/A;  .  CORONARY ARTERY BYPASS GRAFT N/A 03/06/2012   Procedure: CORONARY ARTERY BYPASS GRAFTING (CABG);  Surgeon: Rexene Alberts, MD;  Location: [facility];  Service: Open Heart Surgery;  Laterality: N/A;  . ENDOVEIN HARVEST OF GREATER SAPHENOUS VEIN Right 03/06/2012   Procedure: ENDOVEIN HARVEST OF GREATER SAPHENOUS VEIN;  Surgeon: Rexene Alberts, MD;  Location: [facility];  Service: Open Heart Surgery;  Laterality: Right;  . INTRAOPERATIVE TRANSESOPHAGEAL ECHOCARDIOGRAM N/A 03/06/2012   Procedure: INTRAOPERATIVE TRANSESOPHAGEAL ECHOCARDIOGRAM;  Surgeon: Rexene Alberts, MD;  Location: [facility];  Service: Open Heart Surgery;  Laterality: N/A;  . LEFT HEART CATH Right 03/02/2012   Procedure: LEFT HEART CATH;  Surgeon: Sherren Mocha, MD;  [facility] CATH LAB;  Service: Cardiovascular;  Laterality: Right;  Marland Kitchen MAZE N/A 03/06/2012   Procedure: MAZE;  Surgeon: Rexene Alberts, MD;  Location: [facility];  Service: Open Heart Surgery;  Laterality: N/A;  . PROSTATECTOMY     partial  . RIGHT/LEFT HEART CATH AND CORONARY/GRAFT ANGIOGRAPHY N/A 09/29/2019   Procedure: RIGHT/LEFT HEART CATH AND CORONARY/GRAFT ANGIOGRAPHY;  Surgeon: Adrian Prows, MD;  Location: [facility] CV LAB;  Service: Cardiovascular;  Laterality: N/A;    Current Outpatient Medications  Medication Sig Dispense Refill  . acetaminophen (TYLENOL) 500 MG tablet Take 500 mg by mouth in the morning and at bedtime.    Marland Kitchen allopurinol (ZYLOPRIM) 300 MG tablet Take 150 mg by mouth Daily.    Marland Kitchen amLODipine (NORVASC) 5 MG tablet TAKE 1 TABLET BY MOUTH DAILY (Patient taking differently: Take 5 mg by mouth every evening. ) 90 tablet 3  . cholecalciferol (VITAMIN D) 1000 UNITS tablet Take 1,000 Units by mouth daily.    Marland Kitchen EPINEPHrine 0.3 mg/0.3 mL IJ SOAJ injection Inject 0.3 mLs (0.3 mg total) into the muscle as needed for anaphylaxis. 2 each 2  . fish oil-omega-3 fatty acids 1000 MG capsule Take 1 g by mouth daily.     . furosemide (LASIX) 40 MG tablet TAKE 1 TABLET BY MOUTH EVERY DAY  (Patient taking differently: Take 40 mg by mouth daily. ) 90 tablet 2  . galantamine (RAZADYNE ER) 8 MG 24 hr capsule Take 8 mg by mouth Daily.    . HONEY BEE VENOM IJ Inject as directed See admin instructions. Every eight weeks    . isosorbide mononitrate (IMDUR) 60 MG 24 hr tablet TAKE 1 TABLET(60 MG) BY MOUTH DAILY AFTER SUPPER (Patient taking differently: Take 60 mg by mouth every evening. ) 30 tablet 1  . memantine (NAMENDA) 10 MG tablet Take 10 mg by mouth 2 (two) times daily.    Marland Kitchen NITROSTAT 0.4 MG SL tablet Place 0.4 mg under the tongue every 5 (five) minutes x 3 doses as needed for chest pain.     . pantoprazole (PROTONIX) 40 MG tablet Take 40 mg by mouth every other day.   4  . potassium chloride (MICRO-K) 10 MEQ CR capsule TAKE 1 CAPSULE BY MOUTH DAILY WITH LASIX (Patient taking differently: Take 10 mEq by mouth daily. ) 90 capsule 1  . predniSONE (DELTASONE)  1 MG tablet Take 4 mg by mouth daily with breakfast.     . PRESCRIPTION MEDICATION Cpap    . Psyllium Husk POWD Take 1 Scoop by mouth daily.     . rivaroxaban (XARELTO) 20 MG TABS tablet Take 20 mg by mouth daily.    . rosuvastatin (CRESTOR) 20 MG tablet Take 10 mg by mouth daily.    Marland Kitchen telmisartan (MICARDIS) 40 MG tablet Take 20 mg by mouth daily.     Marland Kitchen triamcinolone cream (KENALOG) 0.1 % Apply 1 application topically as needed (skin irritation).     . vitamin B-12 (CYANOCOBALAMIN) 1000 MCG tablet Take 1,000 mcg by mouth daily.     Marland Kitchen ZETIA 10 MG tablet Take 10 mg by mouth Daily.     Marland Kitchen acetaminophen (TYLENOL) 650 MG CR tablet Take 650 mg by mouth 2 (two) times daily. (Patient not taking: Reported on 10/20/2019)     No current facility-administered medications for this visit.    Allergies  Allergen Reactions  . Amoxicillin Other (See Comments)    Headache, debilitated  . Bee Venom Anaphylaxis  . Morphine And Related Other (See Comments)    hypotension    Social History   Socioeconomic History  . Marital status: Married      Spouse name: Not on file  . Number of children: 4  . Years of education: Not on file  . Highest education level: Not on file  Occupational History  . 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 Smoking status: Former Smoker    Packs/day: 0.50    Years: 15.00    Pack years: 7.50    Types: Cigarettes    Quit date: 11/18/1962    Years since quitting: 56.9  . Smokeless tobacco: Never Used  Vaping Use  . Vaping Use: Never used  Substance and Sexual Activity  . Alcohol use: Yes    Alcohol/week: 2.0 standard drinks    Types: 2 Shots of liquor per week    Comment: daily  . Drug use: Never  . Sexual activity: Yes  Other Topics Concern  . Not on file  Social History Narrative   Lives with wife in [place].     Very active physically without significant limitations.    Exercises regularly   Social Determinants of Health   Financial Resource Strain:   . Difficulty of Paying Living Expenses: Not on file  Food Insecurity:   . Worried About Charity fundraiser in the Last Year: Not on file  . Ran Out of Food in the Last Year: Not on file  Transportation Needs:   . Lack of Transportation (Medical): Not on file  . Lack of Transportation (Non-Medical): Not on file  Physical Activity:   . Days of Exercise per Week: Not on file  . Minutes of Exercise per Session: Not on file  Stress:   . Feeling of Stress : Not on file  Social Connections:   . Frequency of Communication with Friends and Family: Not on file  . Frequency of Social Gatherings with Friends and Family: Not on file  . Attends Religious Services: Not on file  . Active Member of Clubs or Organizations: Not on file  . Attends Archivist Meetings: Not on file  . Marital Status: Not on file  Intimate Partner Violence:   . Fear of Current or Ex-Partner: Not on file  . Emotionally Abused: Not on file  . Physically Abused: Not on  file  . Sexually Abused: Not on file    Family History   Problem Relation Age of Onset  . Dementia Mother   . Stroke Brother     Review of Systems:  As stated in the HPI and otherwise negative.   BP (!) 142/62   Pulse 77   Ht 6\' 1"  (1.854 m)   Wt 245 lb (111.1 kg)   SpO2 95%   BMI 32.32 kg/m   Physical Examination: General: Well developed, well nourished, NAD  HEENT: OP clear, mucus membranes moist  SKIN: warm, dry. No rashes. Neuro: No focal deficits  Musculoskeletal: Muscle strength 5/5 all ext  Psychiatric: Mood and affect normal  Neck: No JVD, no carotid bruits, no thyromegaly, no lymphadenopathy.  Lungs:Clear bilaterally, no wheezes, rhonci, crackles Cardiovascular: Regular rate and rhythm. Loud, harsh, late peaking systolic murmur.  Abdomen:Soft. Bowel sounds present. Non-tender.  Extremities: No lower extremity edema. Pulses are 2 + in the bilateral DP/PT.  EKG:  EKG is not ordered today. The ekg ordered today demonstrates   Echo 10/27/19: 1. Left ventricular ejection fraction, by estimation, is 60 to 65%. The  left ventricle has normal function. The left ventricle has no regional  wall motion abnormalities. Left ventricular diastolic function could not  be evaluated.  2. Right ventricular systolic function is normal. The right ventricular  size is normal. There is normal pulmonary artery systolic pressure.  3. Left atrial size was mildly dilated.  4. Right atrial size was moderately dilated.  5. The mitral valve is normal in structure. No evidence of mitral valve  regurgitation.  6. The aortic valve is tricuspid. There is moderate calcification of the  aortic valve. There is moderate thickening of the aortic valve. Aortic  valve regurgitation is not visualized. Moderate to severe aortic valve  stenosis.  7. Aortic dilatation noted. There is moderate to severe dilatation of the  ascending aorta, measuring 45 mm.   FINDINGS  Left Ventricle: Left ventricular ejection fraction, by estimation, is 60  to 65%.  The left ventricle has normal function. The left ventricle has no  regional wall motion abnormalities. The left ventricular internal cavity  size was normal in size. There is  no left ventricular hypertrophy. Left ventricular diastolic function  could not be evaluated due to atrial fibrillation. Left ventricular  diastolic function could not be evaluated.   Right Ventricle: The right ventricular size is normal. No increase in  right ventricular wall thickness. Right ventricular systolic function is  normal. There is normal pulmonary artery systolic pressure. The tricuspid  regurgitant velocity is 2.66 m/s, and  with an assumed right atrial pressure of 3 mmHg, the estimated right  ventricular systolic pressure is 43.3 mmHg.   Left Atrium: Left atrial size was mildly dilated.   Right Atrium: Right atrial size was moderately dilated.   Pericardium: There is no evidence of pericardial effusion.   Mitral Valve: The mitral valve is normal in structure. No evidence of  mitral valve regurgitation.   Tricuspid Valve: The tricuspid valve is grossly normal. Tricuspid valve  regurgitation is mild . No evidence of tricuspid stenosis.   Aortic Valve: The aortic valve is tricuspid. There is moderate  calcification of the aortic valve. There is moderate thickening of the  aortic valve. There is moderate aortic valve annular calcification. Aortic  valve regurgitation is not visualized. Aortic  regurgitation PHT measures 577 msec. Moderate to severe aortic stenosis  is present. Aortic valve mean gradient measures 28.8 mmHg. Aortic  valve  peak gradient measures 52.1 mmHg. Aortic valve area, by VTI measures 1.37  cm.   Pulmonic Valve: The pulmonic valve was normal in structure. Pulmonic valve  regurgitation is not visualized. No evidence of pulmonic stenosis.   Aorta: Aortic dilatation noted. There is moderate to severe dilatation of  the ascending aorta, measuring 45 mm.   IAS/Shunts: The  atrial septum is grossly normal.     LEFT VENTRICLE  PLAX 2D  LVIDd:     4.60 cm Diastology  LVIDs:     2.20 cm LV e' medial:  5.22 cm/s  LV PW:     1.20 cm LV E/e' medial: 23.9  LV IVS:    1.00 cm LV e' lateral:  6.95 cm/s  LVOT diam:   2.60 cm LV E/e' lateral: 18.0  LV SV:     119  LV SV Index:  51  LVOT Area:   5.31 cm               3D Volume EF:             3D EF:    56 %             LV EDV:    214 ml             LV ESV:    94 ml             LV SV:    120 ml   RIGHT VENTRICLE  RV S prime:   11.40 cm/s  TAPSE (M-mode): 1.3 cm   LEFT ATRIUM       Index    RIGHT ATRIUM      Index  LA diam:    3.70 cm 1.57 cm/m RA Area:   23.20 cm  LA Vol (A2C):  83.5 ml 35.47 ml/m RA Volume:  68.30 ml 29.01 ml/m  LA Vol (A4C):  84.3 ml 35.81 ml/m  LA Biplane Vol: 83.9 ml 35.64 ml/m  AORTIC VALVE  AV Area (Vmax):  1.27 cm  AV Area (Vmean):  1.33 cm  AV Area (VTI):   1.37 cm  AV Vmax:      361.00 cm/s  AV Vmean:     249.000 cm/s  AV VTI:      0.870 m  AV Peak Grad:   52.1 mmHg  AV Mean Grad:   28.8 mmHg  LVOT Vmax:     86.20 cm/s  LVOT Vmean:    62.250 cm/s  LVOT VTI:     0.224 m  LVOT/AV VTI ratio: 0.26  AI PHT:      577 msec    AORTA  Ao Root diam: 3.70 cm  Ao Asc diam: 4.60 cm   MITRAL VALVE        TRICUSPID VALVE  MV Area (PHT): cm     TR Peak grad:  28.3 mmHg  MV Decel Time: 302 msec   TR Vmax:    266.00 cm/s  MV E velocity: 125.00 cm/s  MV A velocity: 50.40 cm/s  SHUNTS  MV E/A ratio: 2.48     Systemic VTI: 0.22 m               Systemic Diam: 2.60 cm    RIGHT/LEFT HEART CATH AND CORONARY/GRAFT ANGIOGRAPHY  Conclusion  Right and left heart catheterization 09/29/2019: Right heart catheterization RA 8/8, mean 6 mmHg. RV 34/0,  EDP 8 mmHg.; PA 31/11, mean 18 mmHg. PA saturation 62%.; PW 13/25, mean 11  mmHg. Aortic saturation 100%. Cardiac output 5.57, cardiac index 2.38, normal. Normal SVR and PVR. LV: 1/4, EDP 14 mmHg. Peak to peak aortic valve gradient was 41 with a mean gradient of 33.3 with a valve area of 1.09 cm.  Left heart catheterization Left main is calcific 50% to 60% stenosis distally and involves ostium of the large circumflex with a ostial 80% stenosis. Gives origin to large OM1, OM 2. SVG to OM1 is widely patent and retrogradely fills the entire circumflex. There is a 70 to 75% stenosis proximal to the graft insertion. LAD: Gives origin to large D1 and D2, mild diffuse disease, subtotally occluded to occluded in the midsegment. LIMA to LAD: Widely patent. RCA: Dominant and occluded in the proximal segment. SVG to RCA: Widely patent.  Impression: Moderate to severe aortic stenosis, symptomatic. Patient will benefit from aortic valve replacement and consideration for TAVR. 110 mL contrast utilized, fluoroscopy time 42.8 minutes. Recommendations  Antiplatelet/Anticoag Recommend Aspirin 81mg  daily for moderate CAD.  Indications  Severe aortic stenosis [I35.0 (ICD-10-CM)]  Dyspnea on exertion [R06.00 (ICD-10-CM)]  Procedural Details  Technical Details Pressure performed: Right heart catheterization, left heart catheterization including LV hemodynamics and pressure pullback across the stenotic aortic valve, need to left and right coronary arteriogram, saphenous vein graft and left internal mammary arteriogram.  Indication: BURLEIGH BROCKMANN is a 86 y.o. Caucasian male with vitamin B12 deficiency, mild dementia, hyperlipidemia, hypertension, CAD, chronic stable angina status post CABG x 3 and Maze and LAA amputation in 2014, paroxysmal atrial fibrillation, obstructive sleep apnea not currently on CPAP, moderate aortic stenosis with thoracic aortic aneurysm and dilation of ascending aorta and  follows Dr. Lilly Cove (No correlation with TTE with regards to aneurysm), venous stasis edema.  Recently has been having worsening dyspnea on exertion. Outpatient echocardiogram revealed moderate to severe aortic stenosis. In view of symptomatic nature of his disease, progression of aortic stenosis, is brought to the cardiac catheterization lab to evaluate coronary anatomy prior to consideration for aortic valve replacement.  Technique: Sterile precautions utilized, left antecubital vein access utilized for performing right heart catheterization and a balloontipped Swan-Ganz was advanced after accessing the left AC with 5 [place] sheath. Right-sided hemodynamics performed and catheter pulled out of body.  Left radial arterial access obtained, I used multiple catheters to access the native left main including AL-1, JL4, and in view of significant difficulty, I exchanged the short left radial arterial access sheath to a 75 cm 6 [place] destination sheath and I was able to complete the procedure. I was also able to cross the left ventricle with AL-1 catheter and then placed a pigtail catheter to perform hemodynamics. All cath exchanges were done over the J-wire. Hemostasis obtained by applying TR band. Manual pressure held for venous access. Estimated blood loss between 50 mL and 150 mL.   During this procedure medications were administered to achieve and maintain moderate conscious sedation while the patient's heart rate, blood pressure, and oxygen saturation were continuously monitored and I was present face-to-face 100% of this time.  Medications (Filter: Administrations occurring from [phone] to 0929 on 09/29/19) (important) Continuous medications are totaled by the amount administered until 09/29/19 0929.  Heparin (Porcine) in NaCl 1000-0.9 UT/500ML-% SOLN (mL) Total volume:  1,500 mL Date/Time  Rate/Dose/Volume Action  09/29/19 0735  500 mL Given  0735  500 mL Given  0735  500 mL Given     midazolam (VERSED) injection (mg) Total dose:  2 mg Date/Time  Rate/Dose/Volume Action  09/29/19  0747  1 mg Given  0833  1 mg Given    fentaNYL (SUBLIMAZE) injection (mcg) Total dose:  50 mcg Date/Time  Rate/Dose/Volume Action  09/29/19 0748  25 mcg Given  0833  25 mcg Given    lidocaine (PF) (XYLOCAINE) 1 % injection (mL) Total volume:  4 mL Date/Time  Rate/Dose/Volume Action  09/29/19 0750  2 mL Given  0804  2 mL Given    Radial Cocktail/Verapamil only (mL) Total volume:  10 mL Date/Time  Rate/Dose/Volume Action  09/29/19 0805  5 mL Given  0845  5 mL Given    heparin sodium (porcine) injection (Units) Total dose:  5,000 Units Date/Time  Rate/Dose/Volume Action  09/29/19 0812  5,000 Units Given    iohexol (OMNIPAQUE) 350 MG/ML injection (mL) Total volume:  110 mL Date/Time  Rate/Dose/Volume Action  09/29/19 0918  110 mL Given    Sedation Time  Sedation Time Physician-1: 1 hour 31 minutes 31 seconds  Contrast  Medication Name Total Dose  iohexol (OMNIPAQUE) 350 MG/ML injection 110 mL    Radiation/Fluoro  Fluoro time: 42.8 (min) DAP: [id number] (mGycm2) Cumulative Air Kerma: 1696 (mGy)  Coronary Findings  Diagnostic Dominance: Right Left Main  Ost LM to Mid LM lesion is 70% stenosed.  Left Anterior Descending  Mid LAD lesion is 99% stenosed. The lesion is ulcerative.  Left Circumflex  Ost Cx to Prox Cx lesion is 85% stenosed.  First Obtuse Marginal Branch  1st Mrg lesion is 75% stenosed.  Right Coronary Artery  Prox RCA lesion is 100% stenosed.  Graft To Dist RCA  Graft To 1st Mrg  LIMA Graft To Dist LAD  Intervention  No interventions have been documented. Right Heart  Right Heart Pressures LV EDP is normal.  Left Heart  Left Ventricle LV end diastolic pressure is normal.  Coronary Diagrams  Diagnostic Dominance: Right  Intervention  Implants      No implant documentation for this case.  Syngo Images  Show  images for CARDIAC CATHETERIZATION Images on Long Term Storage  Show images for Dale, Ribeiro to Procedure Log  Procedure Log    Hemo Data   Most Recent Value  Fick Cardiac Output 5.57 L/min  Fick Cardiac Output Index 2.38 (L/min)/BSA  Aortic Mean Gradient 33.34 mmHg  Aortic Peak Gradient 41 mmHg  Aortic Valve Area 1.09  Aortic Value Area Index 0.47 cm2/BSA  RA A Wave 8 mmHg  RA V Wave 8 mmHg  RA Mean 6 mmHg  RV Systolic Pressure 34 mmHg  RV Diastolic Pressure 0 mmHg  RV EDP 8 mmHg  PA Systolic Pressure 31 mmHg  PA Diastolic Pressure 11 mmHg  PA Mean 18 mmHg  PW A Wave 14 mmHg  PW V Wave 26 mmHg  PW Mean 13 mmHg  AO Systolic Pressure 789 mmHg  AO Diastolic Pressure 44 mmHg  AO Mean 65 mmHg  LV Systolic Pressure 381 mmHg  LV Diastolic Pressure 4 mmHg  LV EDP 14 mmHg  AOp Systolic Pressure 017 mmHg  AOp Diastolic Pressure 53 mmHg  AOp Mean Pressure 69 mmHg  LVp Systolic Pressure 510 mmHg  LVp Diastolic Pressure 3 mmHg  LVp EDP Pressure 13 mmHg  QP/QS 1  TPVR Index 7.55 HRUI  TSVR Index 27.25 HRUI  PVR SVR Ratio 0.12  TPVR/TSVR Ratio 0.28      Cardiac TAVR CT  TECHNIQUE: The patient was scanned on a Siemens Force 258 slice scanner. A 120 kV retrospective scan was triggered in  the descending thoracic aorta at 111 HU's. Gantry rotation speed was 270 msecs and collimation was .9 mm. No beta blockade or nitro were given. The 3D data set was reconstructed in 5% intervals of the R-R cycle. Systolic and diastolic phases were analyzed on a dedicated work station using MPR, MIP and VRT modes. The patient received 80 cc of contrast.  FINDINGS: Aortic Valve:  Aorta: Moderate dilatation of the ascending thoracic aorta, normal arch vessels with moderate calcific atherosclerosis  Sinotubular Junction: 31 mm  Ascending Thoracic Aorta: Moderately dilated 44 mm  Aortic Arch: 25 mm  Descending Thoracic Aorta: 24 mm  Sinus of Valsalva  Measurements:  Non-coronary: 37 mm  Right - coronary: 35.3 mm  Left - coronary: 35.3 mm  Coronary Artery Height above Annulus:  Left Main: 12.6 mm above annulus  Right Coronary: 17.8 mm above annulus  Virtual Basal Annulus Measurements:  Maximum/Minimum Diameter: 29.7 mm x 23.1 mm  Perimeter: 84 mm  Area: 540.6 mm 2  Coronary Arteries: Sufficient height above annulus for deployment Patent SVG to RCA, Patent SVG to OM1, and patent LIMA to LAD  Optimum Fluoroscopic Angle for Delivery: LAO 16 Caudal 23 degrees  IMPRESSION: 1. Tri- leaflet AV with annular area 540 mm2 upper limit for 26 mm Sapien 3 valve Despite large sinuses commissural calcium may suggest smaller valve better than 29 mm valve  2. Optimum angiographic angle for deployment LAO 16 Caudal 23 degrees  3. Moderate aortic root dilatation 4.4 cm  4. Coronary arteries sufficient height above annulus for deployment with patent SVG to RCA/OM1 and patent LIMA to LAD  5. LAA has been clipped with no contrast flow into the appendage  Jenkins Rouge   Electronically Signed By: Jenkins Rouge M.D. On: 10/13/2019 12:57     CT ANGIOGRAPHY CHEST, ABDOMEN AND PELVIS  TECHNIQUE: Multidetector CT imaging through the chest, abdomen and pelvis was performed using the standard protocol during bolus administration of intravenous contrast. Multiplanar reconstructed images and MIPs were obtained and reviewed to evaluate the vascular anatomy.  CONTRAST: 47mL OMNIPAQUE IOHEXOL 350 MG/ML SOLN  COMPARISON: CTA chest 04/29/2017.  FINDINGS: CTA CHEST FINDINGS  Cardiovascular: Heart size is borderline enlarged. There is no significant pericardial fluid, thickening or pericardial calcification. There is aortic atherosclerosis, as well as atherosclerosis of the great vessels of the mediastinum and the coronary arteries, including calcified atherosclerotic plaque in the left main,  left anterior descending, left circumflex and right coronary arteries. Status post median sternotomy for CABG including LIMA to the LAD. Thickening calcification of the aortic valve.  Mediastinum/Lymph Nodes: No pathologically enlarged mediastinal or hilar lymph nodes. Please note that accurate exclusion of hilar adenopathy is limited on noncontrast CT scans. Esophagus is unremarkable in appearance. No axillary lymphadenopathy.  Lungs/Pleura: No suspicious appearing pulmonary nodules or masses are noted. No acute consolidative airspace disease. No pleural effusions.  Musculoskeletal/Soft Tissues: Median sternotomy wires. There are no aggressive appearing lytic or blastic lesions noted in the visualized portions of the skeleton.  CTA ABDOMEN AND PELVIS FINDINGS  Hepatobiliary: No suspicious cystic or solid hepatic lesions. No intra or extrahepatic biliary ductal dilatation. Status post cholecystectomy.  Pancreas: No pancreatic mass. No pancreatic ductal dilatation. No pancreatic or peripancreatic fluid collections or inflammatory changes.  Spleen: Unremarkable.  Adrenals/Urinary Tract: Low-attenuation lesions in the kidneys bilaterally, compatible with simple cysts, largest of which is in the upper pole of the right kidney measuring 3 cm in diameter. Mild bilateral renal atrophy with mild diffuse cortical thinning. No  suspicious renal lesions. Bilateral adrenal glands are normal in appearance. No hydroureteronephrosis. Urinary bladder is normal in appearance.  Stomach/Bowel: Normal appearance of the stomach. No pathologic dilatation of small bowel or colon. Numerous colonic diverticulae are noted, without surrounding inflammatory changes to suggest an acute diverticulitis at this time. Normal appendix.  Vascular/Lymphatic: Aortic atherosclerosis, with vascular findings and measurements pertinent to potential TAVR procedure, as detailed below. In addition, there is  multifocal aneurysmal dilatation and short segment dissections of both the abdominal aorta and the common iliac arteries bilaterally. Multifocal fusiform aneurysmal dilatation of the infrarenal abdominal aorta measuring up to 3.8 x 3.1 cm proximally and 4.1 x 3.0 cm more distally, with multiple short segment dissection flaps within these regions. Aneurysmal dilatation of the common iliac arteries measuring 1.9 cm in diameter on the right and 1.6 cm in diameter on the left. No lymphadenopathy noted in the abdomen or pelvis.  Reproductive: Prostate gland and seminal vesicles are unremarkable in appearance.  Other: No significant volume of ascites. No pneumoperitoneum.  Musculoskeletal: There are no aggressive appearing lytic or blastic lesions noted in the visualized portions of the skeleton.  VASCULAR MEASUREMENTS PERTINENT TO TAVR:  AORTA:  Minimal Aortic Diameter-15 x 11 mm  Severity of Aortic Calcification-severe  RIGHT PELVIS:  Right Common Iliac Artery -  Minimal Diameter-7.9 x 7.1 mm  Tortuosity-mild  Calcification-moderate to severe  Comment-aneurysmally dilated with short segment dissection  Right External Iliac Artery -  Minimal Diameter-9.6 x 8.4 mm  Tortuosity-severe  Calcification-moderate to severe  Right Common Femoral Artery -  Minimal Diameter-7.5 x 7.5 mm  Tortuosity-mild  Calcification-moderate to severe  LEFT PELVIS:  Left Common Iliac Artery -  Minimal Diameter-7.9 x 6.5 mm  Tortuosity-mild  Calcification-moderate to severe  Comment-aneurysmally dilated with short segment dissection  Left External Iliac Artery -  Minimal Diameter-9.6 x 7.6 mm  Tortuosity-moderate  Calcification-moderate to severe  Left Common Femoral Artery -  Minimal Diameter-8.7 x 6.1 mm  Tortuosity-mild  Calcification-moderate to severe  Review of the MIP images confirms the above findings.  IMPRESSION: 1.  Vascular findings and measurements pertinent to potential TAVR procedure, as detailed above. Please note the presence of multiple aneurysms and short-segment dissections involving the infrarenal abdominal aorta as well as the common iliac arteries bilaterally. 2. Severe thickening calcification of the aortic valve, compatible with the reported clinical history of severe aortic stenosis. 3. Aortic atherosclerosis, in addition to left main and 3 vessel coronary artery disease. Status post median sternotomy for CABG including LIMA to the LAD. 4. Colonic diverticulosis without evidence of acute diverticulitis at this time. 5. Additional incidental findings, as above.    Recent Labs: 09/23/2019: TSH 1.950 10/22/2019: ALT 14; B Natriuretic Peptide 118.3; BUN 25; Creatinine, Ser 1.43; Hemoglobin 10.7; Platelets 178; Potassium 4.1; Sodium 140   Lipid Panel    Component Value Date/Time   CHOL 84 03/03/2012 0751   TRIG 75 03/03/2012 0751   HDL 40 03/03/2012 0751   CHOLHDL 2.1 03/03/2012 0751   VLDL 15 03/03/2012 0751   LDLCALC 29 03/03/2012 0751     Wt Readings from Last 3 Encounters:  10/28/19 245 lb (111.1 kg)  10/22/19 247 lb 1.6 oz (112.1 kg)  10/20/19 245 lb (111.1 kg)     Other studies Reviewed: Additional studies/ records that were reviewed today include: echo images, CT images, cath films.  Review of the above records demonstrates: severe AS   Assessment and Plan:   1. Severe Aortic Valve Stenosis: He has  severe, stage D3 aortic valve stenosis. I believe that he has paradoxical low flow, low gradient severe aortic stenosis and his dyspnea and dizziness is related to this.His symptoms have progressed without another clinical reason. I have personally reviewed the echo images. The aortic valve is thickened, calcified with limited leaflet mobility. Despite the fact that his mean gradient is not over 40 mmHg, I think this clearly severe AS. I think he would benefit from AVR.  Given advanced age, he is not a good candidate for conventional AVR by surgical approach. I think he may be a good candidate for TAVR.   STS Risk Score: Procedure: Isolated AVR   Risk of Mortality: 6.557%  Renal Failure: 9.470%  Permanent Stroke: 2.461%  Prolonged Ventilation: 17.501%  DSW Infection: 0.418%  Reoperation: 4.549%  Morbidity or Mortality: 22.327%  Short Length of Stay: 10.095%  Long Length of Stay: 21.293%    I have reviewed the natural history of aortic stenosis with the patient and their family members  who are present today. We have discussed the limitations of medical therapy and the poor prognosis associated with symptomatic aortic stenosis. We have reviewed potential treatment options, including palliative medical therapy, conventional surgical aortic valve replacement, and transcatheter aortic valve replacement. We discussed treatment options in the context of the patient's specific comorbid medical conditions.   I think TAVR is indicated. This will be from the left subclavian approach with a 26 mm Edwards Sapien 3 Ultra valve. We will proceed with TAVR as soon as possible. He has received approval from his insurance company today.        Current medicines are reviewed at length with the patient today.  The patient does not have concerns regarding medicines.  The following changes have been made:  no change  Labs/ tests ordered today include:  No orders of the defined types were placed in this encounter.    Disposition:   F/U with the valve team    Signed, Lauree Chandler, MD 10/28/2019 10:29 AM    [address], [place], [place]  [address] Phone: [phone]; Fax: [phone]

## 2019-10-29 ENCOUNTER — Other Ambulatory Visit (HOSPITAL_COMMUNITY): Payer: Medicare Other

## 2019-10-29 ENCOUNTER — Ambulatory Visit: Payer: Medicare Other | Admitting: Cardiology

## 2019-11-02 ENCOUNTER — Other Ambulatory Visit: Payer: Self-pay

## 2019-11-02 DIAGNOSIS — I350 Nonrheumatic aortic (valve) stenosis: Secondary | ICD-10-CM

## 2019-11-04 ENCOUNTER — Ambulatory Visit: Payer: Medicare Other | Admitting: Cardiology

## 2019-11-04 DIAGNOSIS — Z85820 Personal history of malignant melanoma of skin: Secondary | ICD-10-CM | POA: Diagnosis not present

## 2019-11-04 DIAGNOSIS — C4442 Squamous cell carcinoma of skin of scalp and neck: Secondary | ICD-10-CM | POA: Diagnosis not present

## 2019-11-04 DIAGNOSIS — D1801 Hemangioma of skin and subcutaneous tissue: Secondary | ICD-10-CM | POA: Diagnosis not present

## 2019-11-04 DIAGNOSIS — L905 Scar conditions and fibrosis of skin: Secondary | ICD-10-CM | POA: Diagnosis not present

## 2019-11-04 DIAGNOSIS — L821 Other seborrheic keratosis: Secondary | ICD-10-CM | POA: Diagnosis not present

## 2019-11-04 DIAGNOSIS — L570 Actinic keratosis: Secondary | ICD-10-CM | POA: Diagnosis not present

## 2019-11-06 ENCOUNTER — Other Ambulatory Visit: Payer: Self-pay

## 2019-11-06 ENCOUNTER — Ambulatory Visit (HOSPITAL_COMMUNITY)
Admission: RE | Admit: 2019-11-06 | Discharge: 2019-11-06 | Disposition: A | Discharge status: Home / Self Care | Payer: Medicare Other | Source: Ambulatory Visit | Attending: Cardiovascular Disease | Admitting: Cardiovascular Disease

## 2019-11-06 ENCOUNTER — Encounter (HOSPITAL_COMMUNITY): Payer: Self-pay

## 2019-11-06 ENCOUNTER — Encounter (HOSPITAL_COMMUNITY)
Admission: RE | Admit: 2019-11-06 | Discharge: 2019-11-06 | Disposition: A | Discharge status: Home / Self Care | Payer: Medicare Other | Source: Ambulatory Visit | Attending: Cardiovascular Disease | Admitting: Cardiovascular Disease

## 2019-11-06 DIAGNOSIS — I350 Nonrheumatic aortic (valve) stenosis: Secondary | ICD-10-CM

## 2019-11-06 DIAGNOSIS — Z01818 Encounter for other preprocedural examination: Secondary | ICD-10-CM | POA: Diagnosis not present

## 2019-11-06 LAB — CBC
HCT: 34.1 % — ABNORMAL LOW (ref 39.0–52.0)
Hemoglobin: 10.6 g/dL — ABNORMAL LOW (ref 13.0–17.0)
MCH: 30.3 pg (ref 26.0–34.0)
MCHC: 31.1 g/dL (ref 30.0–36.0)
MCV: 97.4 fL (ref 80.0–100.0)
Platelets: 168 10*3/uL (ref 150–400)
RBC: 3.5 MIL/uL — ABNORMAL LOW (ref 4.22–5.81)
RDW: 13.9 % (ref 11.5–15.5)
WBC: 5.2 10*3/uL (ref 4.0–10.5)
nRBC: 0 % (ref 0.0–0.2)

## 2019-11-06 LAB — BLOOD GAS, ARTERIAL
Acid-base deficit: 0.5 mmol/L (ref 0.0–2.0)
Bicarbonate: 23.3 mmol/L (ref 20.0–28.0)
Drawn by: 58793
FIO2: 21
O2 Saturation: 97.9 %
Patient temperature: 37
pCO2 arterial: 36.5 mmHg (ref 32.0–48.0)
pH, Arterial: 7.422 (ref 7.350–7.450)
pO2, Arterial: 105 mmHg (ref 83.0–108.0)

## 2019-11-06 LAB — COMPREHENSIVE METABOLIC PANEL
ALT: 16 U/L (ref 0–44)
AST: 20 U/L (ref 15–41)
Albumin: 3.4 g/dL — ABNORMAL LOW (ref 3.5–5.0)
Alkaline Phosphatase: 42 U/L (ref 38–126)
Anion gap: 11 (ref 5–15)
BUN: 27 mg/dL — ABNORMAL HIGH (ref 8–23)
CO2: 21 mmol/L — ABNORMAL LOW (ref 22–32)
Calcium: 9.5 mg/dL (ref 8.9–10.3)
Chloride: 106 mmol/L (ref 98–111)
Creatinine, Ser: 1.37 mg/dL — ABNORMAL HIGH (ref 0.61–1.24)
GFR, Estimated: 49 mL/min — ABNORMAL LOW (ref >60)
Glucose, Bld: 161 mg/dL — ABNORMAL HIGH (ref 70–99)
Potassium: 4 mmol/L (ref 3.5–5.1)
Sodium: 138 mmol/L (ref 135–145)
Total Bilirubin: 0.3 mg/dL (ref 0.3–1.2)
Total Protein: 6.4 g/dL — ABNORMAL LOW (ref 6.5–8.1)

## 2019-11-06 LAB — HEMOGLOBIN A1C
Hgb A1c MFr Bld: 6.1 % — ABNORMAL HIGH (ref 4.8–5.6)
Mean Plasma Glucose: 128.37 mg/dL

## 2019-11-06 LAB — URINALYSIS, ROUTINE W REFLEX MICROSCOPIC
Bilirubin Urine: NEGATIVE
Glucose, UA: NEGATIVE mg/dL
Hgb urine dipstick: NEGATIVE
Ketones, ur: NEGATIVE mg/dL
Leukocytes,Ua: NEGATIVE
Nitrite: NEGATIVE
Protein, ur: NEGATIVE mg/dL
Specific Gravity, Urine: 1.019 (ref 1.005–1.030)
pH: 5 (ref 5.0–8.0)

## 2019-11-06 LAB — BRAIN NATRIURETIC PEPTIDE: B Natriuretic Peptide: 175.4 pg/mL — ABNORMAL HIGH (ref 0.0–100.0)

## 2019-11-06 LAB — PROTIME-INR
INR: 1 (ref 0.8–1.2)
Prothrombin Time: 12.8 seconds (ref 11.4–15.2)

## 2019-11-06 LAB — APTT: aPTT: 30 seconds (ref 24–36)

## 2019-11-06 NOTE — Pre-Procedure Instructions (Signed)
[name] [name]  11/06/2019    Your procedure is scheduled on Tuesday, November 10, 2019 at 7:30 AM.   Report to [facility] Entrance "A" Admitting Office at 5:30 AM.   Call this number if you have problems the morning of surgery: [phone]   Questions prior to day of surgery, please call [phone] between 8 & 4 PM.   Remember:  Do not eat or drink after midnight Monday, 11/09/19.  Take these medicines the morning of surgery with A SIP OF WATER: Pantoprazole (Protonix)   Follow office instructions on Xarelto. Stop Fish Oil, Herbal medications and Vitamin B 12 as of today prior to surgery. Do not use NSAIDS (Ibuprofen, Aleve, etc), Multivitamins or Aspirin containing products prior to surgery.     Do not wear jewelry.  Do not wear lotions, powders, cologne or deodorant.  Men may shave face and neck.  Do not bring valuables to the hospital.  [facility] is not responsible for any belongings or valuables.  Contacts, dentures or bridgework may not be worn into surgery.  Leave your suitcase in the car.  After surgery it may be brought to your room.  For patients admitted to the hospital, discharge time will be determined by your treatment team.  [facility] - Preparing for Surgery  Before surgery, you can play an important role.  Because skin is not sterile, your skin needs to be as free of germs as possible.  You can reduce the number of germs on you skin by washing with CHG (chlorahexidine gluconate) soap before surgery.  CHG is an antiseptic cleaner which kills germs and bonds with the skin to continue killing germs even after washing.  Oral Hygiene is also important in reducing the risk of infection.  Remember to brush your teeth with your regular toothpaste the morning of surgery.  Please DO NOT use if you have an allergy to CHG or antibacterial soaps.  If your skin becomes reddened/irritated stop using the CHG and inform your nurse when you arrive at Short Stay.  Do not  shave (including legs and underarms) for at least 48 hours prior to the first CHG shower.  You may shave your face.  Please follow these instructions carefully:   1.  Shower with CHG Soap the night before surgery and the morning of Surgery.  2.  If you choose to wash your hair, wash your hair first as usual with your normal shampoo.  3.  After you shampoo, rinse your hair and body thoroughly to remove the shampoo. 4.  Use CHG as you would any other liquid soap.  You can apply chg directly to the skin and wash gently with a      scrungie or washcloth.           5.  Apply the CHG Soap to your body ONLY FROM THE NECK DOWN.   Do not use on open wounds or open sores. Avoid contact with your eyes, ears, mouth and genitals (private parts).  Wash genitals (private parts) with your normal soap - do this prior to using CHG soap.  6.  Wash thoroughly, paying special attention to the area where your surgery will be performed.  7.  Thoroughly rinse your body with warm water from the neck down.  8.  DO NOT shower/wash with your normal soap after using and rinsing off the CHG Soap.  9.  Pat yourself dry with a clean towel.  10.  Wear clean pajamas.            11.  Place clean sheets on your bed the night of your first shower and do not sleep with pets.  Day of Surgery  Shower as above. Do not apply any lotions/deodorants the morning of surgery.   Please wear clean clothes to the hospital. Remember to brush your teeth with toothpaste.  Please read over the fact sheets that you were given.

## 2019-11-06 NOTE — Anesthesia Preprocedure Evaluation (Addendum)
Anesthesia Evaluation  Patient identified by MRN, date of birth, ID band Patient awake    Reviewed: Allergy & Precautions, H&P , NPO status , Patient's Chart, lab work & pertinent test results  Airway Mallampati: II   Neck ROM: full    Dental  (+) Teeth Intact, Dental Advisory Given   Pulmonary shortness of breath, former smoker,    breath sounds clear to auscultation       Cardiovascular hypertension, + CAD, + Past MI, + CABG and +CHF  + Valvular Problems/Murmurs AS  Rhythm:regular Rate:Normal     Neuro/Psych  Headaches,    GI/Hepatic GERD  ,  Endo/Other    Renal/GU      Musculoskeletal  (+) Arthritis ,   Abdominal   Peds  Hematology  (+) anemia ,   Anesthesia Other Findings   Reproductive/Obstetrics                           Anesthesia Physical Anesthesia Plan  ASA: III  Anesthesia Plan: General   Post-op Pain Management:    Induction: Intravenous  PONV Risk Score and Plan: 2 and Ondansetron, Dexamethasone and Treatment may vary due to age or medical condition  Airway Management Planned: Oral ETT  Additional Equipment: TEE and Arterial line  Intra-op Plan:   Post-operative Plan: Extubation in OR  Informed Consent: I have reviewed the patients History and Physical, chart, labs and discussed the procedure including the risks, benefits and alternatives for the proposed anesthesia with the patient or authorized representative who has indicated his/her understanding and acceptance.       Plan Discussed with: CRNA, Anesthesiologist and Surgeon  Anesthesia Plan Comments: (See PAT note written by Myra Gianotti, PA-C. Case rescheduled for 11/10/19 for insurance purposes. )       Anesthesia Quick Evaluation

## 2019-11-06 NOTE — Progress Notes (Signed)
PCP - Dr. Abner Greenspan Cardiologist - [place]  Chest x-ray - today EKG - 10/22/19 ECHO - 10/27/19 Cardiac Cath - 09/29/19  Sleep Study - 2016 (Epic) CPAP - yes  Pre-diabetic - does not check his blood sugar at home  Blood Thinner Instructions: Per Dr. Camillia Herter office pt's last dose should be 11/04/19 - pt states that is the last time he took it  COVID TEST- Scheduled for 11/07/19   Anesthesia review: See Allison's note from 10/22/19  Patient denies shortness of breath, fever, cough and chest pain at PAT appointment   All instructions explained to the patient, with a verbal understanding of the material. Patient agrees to go over the instructions while at home for a better understanding. Patient also instructed to self quarantine after being tested for COVID-19. The opportunity to ask questions was provided.

## 2019-11-07 ENCOUNTER — Other Ambulatory Visit (HOSPITAL_COMMUNITY)
Admission: RE | Admit: 2019-11-07 | Discharge: 2019-11-07 | Disposition: A | Discharge status: Home / Self Care | Payer: Medicare Other | Source: Ambulatory Visit | Attending: Cardiovascular Disease | Admitting: Cardiovascular Disease

## 2019-11-07 DIAGNOSIS — I2510 Atherosclerotic heart disease of native coronary artery without angina pectoris: Secondary | ICD-10-CM | POA: Diagnosis not present

## 2019-11-07 DIAGNOSIS — I712 Thoracic aortic aneurysm, without rupture: Secondary | ICD-10-CM | POA: Diagnosis not present

## 2019-11-07 DIAGNOSIS — I714 Abdominal aortic aneurysm, without rupture: Secondary | ICD-10-CM | POA: Diagnosis present

## 2019-11-07 DIAGNOSIS — I872 Venous insufficiency (chronic) (peripheral): Secondary | ICD-10-CM | POA: Diagnosis present

## 2019-11-07 DIAGNOSIS — Z006 Encounter for examination for normal comparison and control in clinical research program: Secondary | ICD-10-CM | POA: Diagnosis not present

## 2019-11-07 DIAGNOSIS — Z952 Presence of prosthetic heart valve: Secondary | ICD-10-CM | POA: Diagnosis not present

## 2019-11-07 DIAGNOSIS — N184 Chronic kidney disease, stage 4 (severe): Secondary | ICD-10-CM | POA: Diagnosis not present

## 2019-11-07 DIAGNOSIS — Z01812 Encounter for preprocedural laboratory examination: Secondary | ICD-10-CM | POA: Insufficient documentation

## 2019-11-07 DIAGNOSIS — F0390 Unspecified dementia without behavioral disturbance: Secondary | ICD-10-CM | POA: Diagnosis not present

## 2019-11-07 DIAGNOSIS — I080 Rheumatic disorders of both mitral and aortic valves: Secondary | ICD-10-CM | POA: Diagnosis not present

## 2019-11-07 DIAGNOSIS — G4733 Obstructive sleep apnea (adult) (pediatric): Secondary | ICD-10-CM | POA: Diagnosis not present

## 2019-11-07 DIAGNOSIS — E1122 Type 2 diabetes mellitus with diabetic chronic kidney disease: Secondary | ICD-10-CM | POA: Diagnosis not present

## 2019-11-07 DIAGNOSIS — E538 Deficiency of other specified B group vitamins: Secondary | ICD-10-CM | POA: Diagnosis not present

## 2019-11-07 DIAGNOSIS — I130 Hypertensive heart and chronic kidney disease with heart failure and stage 1 through stage 4 chronic kidney disease, or unspecified chronic kidney disease: Secondary | ICD-10-CM | POA: Diagnosis not present

## 2019-11-07 DIAGNOSIS — I10 Essential (primary) hypertension: Secondary | ICD-10-CM | POA: Diagnosis not present

## 2019-11-07 DIAGNOSIS — M353 Polymyalgia rheumatica: Secondary | ICD-10-CM | POA: Diagnosis not present

## 2019-11-07 DIAGNOSIS — I452 Bifascicular block: Secondary | ICD-10-CM | POA: Diagnosis not present

## 2019-11-07 DIAGNOSIS — Z951 Presence of aortocoronary bypass graft: Secondary | ICD-10-CM | POA: Diagnosis not present

## 2019-11-07 DIAGNOSIS — E669 Obesity, unspecified: Secondary | ICD-10-CM | POA: Diagnosis present

## 2019-11-07 DIAGNOSIS — E785 Hyperlipidemia, unspecified: Secondary | ICD-10-CM | POA: Diagnosis not present

## 2019-11-07 DIAGNOSIS — Z954 Presence of other heart-valve replacement: Secondary | ICD-10-CM | POA: Diagnosis not present

## 2019-11-07 DIAGNOSIS — Z20822 Contact with and (suspected) exposure to covid-19: Secondary | ICD-10-CM | POA: Diagnosis not present

## 2019-11-07 DIAGNOSIS — I350 Nonrheumatic aortic (valve) stenosis: Secondary | ICD-10-CM | POA: Diagnosis not present

## 2019-11-07 DIAGNOSIS — N400 Enlarged prostate without lower urinary tract symptoms: Secondary | ICD-10-CM | POA: Diagnosis present

## 2019-11-07 DIAGNOSIS — M109 Gout, unspecified: Secondary | ICD-10-CM | POA: Diagnosis present

## 2019-11-07 DIAGNOSIS — H409 Unspecified glaucoma: Secondary | ICD-10-CM | POA: Diagnosis present

## 2019-11-07 DIAGNOSIS — J9811 Atelectasis: Secondary | ICD-10-CM | POA: Diagnosis not present

## 2019-11-07 DIAGNOSIS — I252 Old myocardial infarction: Secondary | ICD-10-CM | POA: Diagnosis not present

## 2019-11-07 DIAGNOSIS — I700 Atherosclerosis of aorta: Secondary | ICD-10-CM | POA: Diagnosis present

## 2019-11-07 DIAGNOSIS — I25118 Atherosclerotic heart disease of native coronary artery with other forms of angina pectoris: Secondary | ICD-10-CM | POA: Diagnosis not present

## 2019-11-07 DIAGNOSIS — I5033 Acute on chronic diastolic (congestive) heart failure: Secondary | ICD-10-CM | POA: Diagnosis not present

## 2019-11-07 DIAGNOSIS — I480 Paroxysmal atrial fibrillation: Secondary | ICD-10-CM | POA: Diagnosis not present

## 2019-11-07 LAB — SARS CORONAVIRUS 2 (TAT 6-24 HRS): SARS Coronavirus 2: NEGATIVE

## 2019-11-09 MED ORDER — DEXMEDETOMIDINE HCL IN NACL 400 MCG/100ML IV SOLN
0.1000 ug/kg/h | INTRAVENOUS | Status: DC
  Filled 2019-11-09: qty 100

## 2019-11-09 MED ORDER — POTASSIUM CHLORIDE 2 MEQ/ML IV SOLN
80.0000 meq | INTRAVENOUS | Status: DC
  Filled 2019-11-09: qty 40

## 2019-11-09 MED ORDER — SODIUM CHLORIDE 0.9 % IV SOLN
1.5000 g | INTRAVENOUS | Status: AC
  Administered 2019-11-10: 1.5 g via INTRAVENOUS
  Filled 2019-11-09: qty 1.5

## 2019-11-09 MED ORDER — SODIUM CHLORIDE 0.9 % IV SOLN
INTRAVENOUS | Status: DC
  Filled 2019-11-09: qty 30

## 2019-11-09 MED ORDER — MAGNESIUM SULFATE 50 % IJ SOLN
40.0000 meq | INTRAMUSCULAR | Status: DC
  Filled 2019-11-09: qty 9.85

## 2019-11-09 MED ORDER — NOREPINEPHRINE 4 MG/250ML-% IV SOLN
0.0000 ug/min | INTRAVENOUS | Status: AC
  Administered 2019-11-10: 2 ug/min via INTRAVENOUS
  Filled 2019-11-09: qty 250

## 2019-11-09 MED ORDER — VANCOMYCIN HCL 1500 MG/300ML IV SOLN
1500.0000 mg | INTRAVENOUS | Status: AC
  Administered 2019-11-10: 1500 mg via INTRAVENOUS
  Filled 2019-11-09: qty 300

## 2019-11-09 NOTE — H&P (Signed)
[address]       [place] [address]             [phone]      Cardiothoracic Surgery Admission History and Physical   Referring Provider is Adrian Prows, MD  PCP is [name], MD      Chief Complaint  Patient presents with  . Aortic stenosis       HPI:  Patient is an 86 year old male with history of coronary artery disease s/p CABGx3 in 2014, hypertension, atrial fibrillation on long term anticoagulation, s/p maze procedure in 2014 currently maintaining sinus rhythm, hyperlipidemia, type 2 diabetes mellitus, obstructive sleep apnea and polymyalgia rheumatica on long term prednisone who has been referred for surgical consultation to discuss treatment options for management of severe symptomatic aortic stenosis. I first had the opportunity to care for the patient when he underwent coronary artery bypass grafting 3 and Maze procedure on 03/06/2012 for severe three-vessel coronary artery disease and recurrent paroxysmal atrial fibrillation. At the time of his surgery his aortic valve was notably trileaflet with no aortic stenosis but mild leaflet sclerosis and mild aortic insufficiency. He was also noted to have mild to moderate fusiform dilatation of the ascending thoracic aorta which we have followed intermittently ever since. He was last seen here in our office in 2019 at which time the diameter of his ascending aorta remained stable. The patient has been followed carefully for several years by Dr. Einar Gip, and follow-up echocardiograms have demonstrated the development of aortic stenosis that has slowly progressed in severity. Most recent echocardiogram performed September 24, 2019 revealed low normal left ventricular systolic function with ejection fraction estimated 50 to 55%. The aortic valve appeared trileaflet with severe aortic stenosis. Peak and mean transvalvular gradients were reported 40 and 27 mmHg respectively, corresponding to aortic valve area calculated  0.97 cm. There was mild mitral regurgitation and mild to moderate tricuspid regurgitation. Pulmonary artery pressures of. Mildly elevated. The patient was recently seen in follow-up by Dr. Einar Gip and complained of worsening symptoms of exertional shortness of breath and fatigue. Diagnostic cardiac catheterization was performed September 29, 2019 and confirmed the presence of aortic stenosis with peak to peak and mean transvalvular gradients measured 41 and 33 mmHg respectively, corresponding to aortic valve area 1.09 cm. Right heart pressures were very mildly elevated. The patient was referred to the multidisciplinary heart valve clinic and underwent CT angiography. Cardiothoracic surgical consultation was requested.  Patient is married and lives locally in [place] with his wife. He has been retired for many years but remains active physically and functionally independent. He complains that he has been slowing down over the past 2 to 3 years with worsening exertional shortness of breath and fatigue. The patient now gets short of breath with moderate and occasionally low level activity. He frequently has to stop and take breaks. He sometimes has tightness across his chest for which she uses nitroglycerin. He has had some dizzy spells and one episode where he frankly collapsed without losing consciousness. He denies resting shortness of breath, PND, or orthopnea. He has chronic lower extremity edema. Appetite is good and weight is stable. He ambulates without need for mechanical assistance although he does report his balance is mildly off. He has not had any mechanical falls.       Past Medical History:  Diagnosis Date  . AAA (abdominal aortic aneurysm) ([facility])   . Acute coronary syndrome ([facility]) 02/29/2012  . Acute systolic heart  failure ([facility]) 03/03/2012  . Angina   . Ascending aorta dilatation ([facility]) 03/13/2012   Fusiform dilatation of the ascending thoracic aorta discovered during surgery, max diameter  4.2-4.3 cm  . Atrial fibrillation ([facility]) 02/29/2012   Recurrent paroxysmal, new-onset  . BPH (benign prostatic hypertrophy)   . CAD (coronary artery disease)    Cath April 2012 40% left main, 50% LAD, 40% OM, occluded RCA with L-R collaterals   . CHF (congestive heart failure) ([facility])   . Chronic venous insufficiency   . Glaucoma   . Gout   . Hyperlipidemia   . Hypertension   . Kidney stones   . MI (myocardial infarction) ([facility])   . Obesity (BMI 30-39.9)   . Paroxysmal atrial fibrillation ([facility]) 02/29/2012   Recurrent paroxysmal, new-onset   . PMR (polymyalgia rheumatica) ([facility])   . S/P CABG x 3 03/06/2012   LIMA to LAD, SVG to OM, SVG to RCA, EVH via right thigh  . S/P Maze operation for atrial fibrillation 03/06/2012   Complete bilateral lesions set using bipolar radiofrequency and cryothermy ablation with clipping of LA appendage  . Severe aortic stenosis 09/29/2019        Past Surgical History:  Procedure Laterality Date  . CARDIAC CATHETERIZATION    . CATARACT EXTRACTION, BILATERAL    . CHOLECYSTECTOMY N/A 03/24/2012   Procedure: LAPAROSCOPIC CHOLECYSTECTOMY WITH INTRAOPERATIVE CHOLANGIOGRAM; Surgeon: Zenovia Jarred, MD; Location: [facility]; Service: General; Laterality: N/A;  . CORONARY ARTERY BYPASS GRAFT N/A 03/06/2012   Procedure: CORONARY ARTERY BYPASS GRAFTING (CABG); Surgeon: Rexene Alberts, MD; Location: [facility]; Service: Open Heart Surgery; Laterality: N/A;  . ENDOVEIN HARVEST OF GREATER SAPHENOUS VEIN Right 03/06/2012   Procedure: ENDOVEIN HARVEST OF GREATER SAPHENOUS VEIN; Surgeon: Rexene Alberts, MD; Location: [facility]; Service: Open Heart Surgery; Laterality: Right;  . INTRAOPERATIVE TRANSESOPHAGEAL ECHOCARDIOGRAM N/A 03/06/2012   Procedure: INTRAOPERATIVE TRANSESOPHAGEAL ECHOCARDIOGRAM; Surgeon: Rexene Alberts, MD; Location: [facility]; Service: Open Heart Surgery; Laterality: N/A;  . LEFT HEART CATH Right 03/02/2012   Procedure: LEFT HEART CATH; Surgeon: Sherren Mocha, MD; Location: [facility]  CATH LAB; Service: Cardiovascular; Laterality: Right;  Marland Kitchen MAZE N/A 03/06/2012   Procedure: MAZE; Surgeon: Rexene Alberts, MD; Location: [facility]; Service: Open Heart Surgery; Laterality: N/A;  . PROSTATECTOMY    . RIGHT/LEFT HEART CATH AND CORONARY/GRAFT ANGIOGRAPHY N/A 09/29/2019   Procedure: RIGHT/LEFT HEART CATH AND CORONARY/GRAFT ANGIOGRAPHY; Surgeon: Adrian Prows, MD; Location: [facility] LAB; Service: Cardiovascular; Laterality: N/A;        Family History  Problem Relation Age of Onset  . Dementia Mother   . Stroke Brother    Social History        Socioeconomic History  . Marital status: Married    Spouse name: Not on file  . Number of children: 4  . Years of education: Not on file  . Highest education level: Not on file  Occupational History  . 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 Smoking status: Former Smoker    Packs/day: 0.50    Years: 15.00    Pack years: 7.50    Types: Cigarettes    Quit date: 11/18/1962    Years since quitting: 56.9  . Smokeless tobacco: Never Used  Vaping Use  . Vaping Use: Never used  Substance and Sexual Activity  . Alcohol use: Yes    Alcohol/week: 2.0 standard drinks    Types: 2 Shots of liquor per week    Comment: daily  .  Drug use: Never  . Sexual activity: Yes  Other Topics Concern  . Not on file  Social History Narrative   Lives with wife in [place].    Very active physically without significant limitations.    Exercises regularly   Social Determinants of Health      Financial Resource Strain:   . Difficulty of Paying Living Expenses: Not on file  Food Insecurity:   . Worried About Charity fundraiser in the Last Year: Not on file  . Ran Out of Food in the Last Year: Not on file  Transportation Needs:   . Lack of Transportation (Medical): Not on file  . Lack of Transportation (Non-Medical): Not on file  Physical Activity:   . Days of Exercise per Week: Not on file  . Minutes of Exercise per  Session: Not on file  Stress:   . Feeling of Stress : Not on file  Social Connections:   . Frequency of Communication with Friends and Family: Not on file  . Frequency of Social Gatherings with Friends and Family: Not on file  . Attends Religious Services: Not on file  . Active Member of Clubs or Organizations: Not on file  . Attends Archivist Meetings: Not on file  . Marital Status: Not on file  Intimate Partner Violence:   . Fear of Current or Ex-Partner: Not on file  . Emotionally Abused: Not on file  . Physically Abused: Not on file  . Sexually Abused: Not on file         Current Outpatient Medications  Medication Sig Dispense Refill  . acetaminophen (TYLENOL) 650 MG CR tablet Take 650 mg by mouth 2 (two) times daily.    Marland Kitchen allopurinol (ZYLOPRIM) 300 MG tablet Take 150 mg by mouth Daily.    Marland Kitchen amLODipine (NORVASC) 5 MG tablet TAKE 1 TABLET BY MOUTH DAILY (Patient taking differently: Take 5 mg by mouth every evening. ) 90 tablet 3  . cholecalciferol (VITAMIN D) 1000 UNITS tablet Take 1,000 Units by mouth daily.    Marland Kitchen EPINEPHrine 0.3 mg/0.3 mL IJ SOAJ injection Inject 0.3 mLs (0.3 mg total) into the muscle as needed for anaphylaxis. 2 each 2  . fish oil-omega-3 fatty acids 1000 MG capsule Take 1 g by mouth daily.     . furosemide (LASIX) 40 MG tablet TAKE 1 TABLET BY MOUTH EVERY DAY (Patient taking differently: Take 40 mg by mouth daily. ) 90 tablet 2  . galantamine (RAZADYNE ER) 8 MG 24 hr capsule Take 8 mg by mouth Daily.    . HONEY BEE VENOM IJ Inject as directed See admin instructions. Every eight weeks    . isosorbide mononitrate (IMDUR) 60 MG 24 hr tablet TAKE 1 TABLET(60 MG) BY MOUTH DAILY AFTER SUPPER (Patient taking differently: Take 60 mg by mouth every evening. ) 30 tablet 1  . memantine (NAMENDA) 10 MG tablet Take 10 mg by mouth 2 (two) times daily.    Marland Kitchen NITROSTAT 0.4 MG SL tablet Place 0.4 mg under the tongue every 5 (five) minutes x 3 doses as needed for chest  pain.     . pantoprazole (PROTONIX) 40 MG tablet Take 40 mg by mouth every other day.   4  . potassium chloride (MICRO-K) 10 MEQ CR capsule TAKE 1 CAPSULE BY MOUTH DAILY WITH LASIX (Patient taking differently: Take 10 mEq by mouth daily. ) 90 capsule 1  . predniSONE (DELTASONE) 1 MG tablet Take 1 mg by mouth daily with breakfast.    .  PRESCRIPTION MEDICATION Cpap    . Psyllium Husk POWD Take 1 Dose by mouth daily.    . rivaroxaban (XARELTO) 20 MG TABS tablet Take 20 mg by mouth daily.    . rosuvastatin (CRESTOR) 20 MG tablet Take 10 mg by mouth daily.    Marland Kitchen telmisartan (MICARDIS) 40 MG tablet Take 20 mg by mouth daily.     Marland Kitchen triamcinolone cream (KENALOG) 0.1 % Apply 1 application topically as needed (skin irritation).     . vitamin B-12 (CYANOCOBALAMIN) 1000 MCG tablet Take 1,000 mcg by mouth daily.     Marland Kitchen ZETIA 10 MG tablet Take 10 mg by mouth Daily.      No current facility-administered medications for this visit.        Allergies  Allergen Reactions  . Amoxicillin Other (See Comments)    Headache, debilitated  . Bee Venom Anaphylaxis  . Morphine And Related Other (See Comments)    hypotension   Review of Systems:   General: normal appetite, decreased energy, no weight gain, no weight loss, no fever  Cardiac: + chest pain with exertion, + chest pain at rest, +SOB with exertion, no resting SOB, no PND, no orthopnea, no palpitations, no arrhythmia, no atrial fibrillation, + LE edema, + dizzy spells, no syncope  Respiratory: + shortness of breath, no home oxygen, no productive cough, no dry cough, no bronchitis, no wheezing, no hemoptysis, no asthma, no pain with inspiration or cough, + sleep apnea, no CPAP at night  GI: no difficulty swallowing, no reflux, no frequent heartburn, no hiatal hernia, no abdominal pain, no constipation, no diarrhea, no hematochezia, no hematemesis, no melena  GU: no dysuria, no frequency, no urinary tract infection, no hematuria, + enlarged prostate, no kidney  stones, no kidney disease  Vascular: no pain suggestive of claudication, no pain in feet, no leg cramps, no varicose veins, no DVT, no non-healing foot ulcer  Neuro: no stroke, no TIA's, no seizures, no headaches, no temporary blindness one eye, no slurred speech, no peripheral neuropathy, no chronic pain, mild instability of gait, mild memory/cognitive dysfunction  Musculoskeletal: + arthritis, no joint swelling, no myalgias, mild difficulty walking, slightly decreased mobility  Skin: no rash, + itching, no skin infections, no pressure sores or ulcerations  Psych: no anxiety, no depression, no nervousness, no unusual recent stress  Eyes: no blurry vision, no floaters, + recent vision changes, + wears glasses or contacts  ENT: + hearing loss, no loose or painful teeth, no dentures, last saw dentist 6 weeks ago  Hematologic: + easy bruising, + abnormal bleeding, no clotting disorder, no frequent epistaxis  Endocrine: no diabetes, does not check CBG's at home    Physical Exam:   BP 100/62 (BP Location: Right Arm, Patient Position: Sitting)  Pulse 77  Temp (!) 97.5 F (36.4 C)  Resp 20  Ht 6\' 1"  (1.854 m)  Wt 245 lb (111.1 kg)  SpO2 96% Comment: RA with mask on  BMI 32.32 kg/m  General: Moderately obese, elderly but well-appearing  HEENT: Unremarkable no  Neck: no JVD, no bruits, no adenopathy no  Chest: clear to auscultation, symmetrical breath sounds, no wheezes, no rhonchi no  CV: RRR, grade III/VI crescendo/decrescendo murmur heard best at RSB, no diastolic murmur  Abdomen: soft, non-tender, no masses  Extremities: warm, well-perfused, pulses not palpable, 3+ bilateral LE edema  Rectal/GU Deferred  Neuro: Grossly non-focal and symmetrical throughout  Skin: Clean and dry, no rashes, no breakdown    Diagnostic Tests:  Echocardiogram 09/24/2019:  Normal LV systolic function with visual EF 50-55%. Left ventricle cavity  is normal in size. Moderate concentric hypertrophy of the  left ventricle.  Normal global wall motion. Doppler evidence of grade II (pseudonormal)  diastolic dysfunction, elevated LAP.  Left atrial cavity is mildly dilated.  Trileaflet aortic valve. Severe aortic stenosis. Mild (Grade I) aortic  regurgitation. Moderate aortic valve leaflet calcification. Severely  restricted aortic valve leaflets. Aortic valve peak pressure gradient of  40 and mean gradient of 21mmHg, calculated aortic valve area 0.97 cm.  Mild calcification of the mitral valve annulus. Structurally normal mitral  valve. Mild (Grade I) mitral regurgitation.  Structurally normal tricuspid valve. Mild to moderate tricuspid  regurgitation. Mild pulmonary hypertension. RVSP measures 37 mmHg.  IVC is dilated with respiratory variation. This suggest elevated right  heart pressure (est 8 mm Hg).  Compared to the study done on 03/05/2019, no significant change in peak or  mean pressure gradient. LVEF appears to have slightly improved from 45%.    RIGHT/LEFT HEART CATH AND CORONARY/GRAFT ANGIOGRAPHY  Conclusion  Right and left heart catheterization 09/29/2019:  Right heart catheterization  RA 8/8, mean 6 mmHg. RV 34/0, EDP 8 mmHg.; PA 31/11, mean 18 mmHg. PA saturation 62%.; PW 13/25, mean 11 mmHg. Aortic saturation 100%.  Cardiac output 5.57, cardiac index 2.38, normal. Normal SVR and PVR.  LV: 1/4, EDP 14 mmHg. Peak to peak aortic valve gradient was 41 with a mean gradient of 33.3 with a valve area of 1.09 cm.  Left heart catheterization  Left main is calcific 50% to 60% stenosis distally and involves ostium of the large circumflex with a ostial 80% stenosis. Gives origin to large OM1, OM 2.  SVG to OM1 is widely patent and retrogradely fills the entire circumflex. There is a 70 to 75% stenosis proximal to the graft insertion.  LAD: Gives origin to large D1 and D2, mild diffuse disease, subtotally occluded to occluded in the midsegment.  LIMA to LAD: Widely patent.  RCA: Dominant and  occluded in the proximal segment.  SVG to RCA: Widely patent.  Impression: Moderate to severe aortic stenosis, symptomatic. Patient will benefit from aortic valve replacement and consideration for TAVR. 110 mL contrast utilized, fluoroscopy time 42.8 minutes.  Recommendations  Antiplatelet/Anticoag Recommend Aspirin 81mg  daily for moderate CAD.  Indications  Severe aortic stenosis [I35.0 (ICD-10-CM)]  Dyspnea on exertion [R06.00 (ICD-10-CM)]  Procedural Details  Technical Details Pressure performed: Right heart catheterization, left heart catheterization including LV hemodynamics and pressure pullback across the stenotic aortic valve, need to left and right coronary arteriogram, saphenous vein graft and left internal mammary arteriogram.  Indication: HUNG RHINESMITH is a 86 y.o. Caucasian male with vitamin B12 deficiency, mild dementia, hyperlipidemia, hypertension, CAD, chronic stable angina status post CABG x 3 and Maze and LAA amputation in 2014, paroxysmal atrial fibrillation, obstructive sleep apnea not currently on CPAP, moderate aortic stenosis with thoracic aortic aneurysm and dilation of ascending aorta and follows Dr. Lilly Cove (No correlation with TTE with regards to aneurysm), venous stasis edema.  Recently has been having worsening dyspnea on exertion. Outpatient echocardiogram revealed moderate to severe aortic stenosis. In view of symptomatic nature of his disease, progression of aortic stenosis, is brought to the cardiac catheterization lab to evaluate coronary anatomy prior to consideration for aortic valve replacement.  Technique: Sterile precautions utilized, left antecubital vein access utilized for performing right heart catheterization and a balloontipped Swan-Ganz was advanced after accessing the left AC with 5  French sheath. Right-sided hemodynamics performed and catheter pulled out of body.  Left radial arterial access obtained, I used multiple catheters to access the native  left main including AL-1, JL4, and in view of significant difficulty, I exchanged the short left radial arterial access sheath to a 75 cm 6 [place] destination sheath and I was able to complete the procedure. I was also able to cross the left ventricle with AL-1 catheter and then placed a pigtail catheter to perform hemodynamics. All cath exchanges were done over the J-wire. Hemostasis obtained by applying TR band. Manual pressure held for venous access. Estimated blood loss between 50 mL and 150 mL.   During this procedure medications were administered to achieve and maintain moderate conscious sedation while the patient's heart rate, blood pressure, and oxygen saturation were continuously monitored and I was present face-to-face 100% of this time.  Medications  (Filter: Administrations occurring from [phone] to 0929 on 09/29/19)  (important) Continuous medications are totaled by the amount administered until 09/29/19 0929.  Heparin (Porcine) in NaCl 1000-0.9 UT/500ML-% SOLN (mL)  Total volume: 1,500 mL  Date/Time  Rate/Dose/Volume Action  09/29/19 0735  500 mL Given  0735  500 mL Given  0735  500 mL Given  midazolam (VERSED) injection (mg)  Total dose: 2 mg  Date/Time  Rate/Dose/Volume Action  09/29/19 0747  1 mg Given  0833  1 mg Given  fentaNYL (SUBLIMAZE) injection (mcg)  Total dose: 50 mcg  Date/Time  Rate/Dose/Volume Action  09/29/19 0748  25 mcg Given  0833  25 mcg Given  lidocaine (PF) (XYLOCAINE) 1 % injection (mL)  Total volume: 4 mL  Date/Time  Rate/Dose/Volume Action  09/29/19 0750  2 mL Given  0804  2 mL Given  Radial Cocktail/Verapamil only (mL)  Total volume: 10 mL  Date/Time  Rate/Dose/Volume Action  09/29/19 0805  5 mL Given  0845  5 mL Given  heparin sodium (porcine) injection (Units)  Total dose: 5,000 Units  Date/Time  Rate/Dose/Volume Action  09/29/19 0812  5,000 Units Given  iohexol (OMNIPAQUE) 350 MG/ML injection (mL)  Total volume: 110 mL  Date/Time   Rate/Dose/Volume Action  09/29/19 0918  110 mL Given  Sedation Time  Sedation Time Physician-1: 1 hour 31 minutes 31 seconds  Contrast  Medication Name Total Dose  iohexol (OMNIPAQUE) 350 MG/ML injection 110 mL  Radiation/Fluoro  Fluoro time: 42.8 (min)  DAP: [id number] (mGycm2)  Cumulative Air Kerma: 5053 (mGy)  Coronary Findings  Diagnostic  Dominance: Right  Left Main  Ost LM to Mid LM lesion is 70% stenosed.  Left Anterior Descending  Mid LAD lesion is 99% stenosed. The lesion is ulcerative.  Left Circumflex  Ost Cx to Prox Cx lesion is 85% stenosed.  First Obtuse Marginal Branch  1st Mrg lesion is 75% stenosed.  Right Coronary Artery  Prox RCA lesion is 100% stenosed.  Graft To Dist RCA  Graft To 1st Mrg  LIMA Graft To Dist LAD  Intervention  No interventions have been documented.  Right Heart  Right Heart Pressures LV EDP is normal.  Left Heart  Left Ventricle LV end diastolic pressure is normal.  Coronary Diagrams  Diagnostic  Dominance: Right   Intervention  Implants     No implant documentation for this case.  Syngo Images  Show images for CARDIAC CATHETERIZATION  Images on Long Term Storage  Show images for Zan, [place] to Procedure Log    Procedure Log  Hemo Data   Most Recent  Value  Fick Cardiac Output 5.57 L/min  Fick Cardiac Output Index 2.38 (L/min)/BSA  Aortic Mean Gradient 33.34 mmHg  Aortic Peak Gradient 41 mmHg  Aortic Valve Area 1.09  Aortic Value Area Index 0.47 cm2/BSA  RA A Wave 8 mmHg  RA V Wave 8 mmHg  RA Mean 6 mmHg  RV Systolic Pressure 34 mmHg  RV Diastolic Pressure 0 mmHg  RV EDP 8 mmHg  PA Systolic Pressure 31 mmHg  PA Diastolic Pressure 11 mmHg  PA Mean 18 mmHg  PW A Wave 14 mmHg  PW V Wave 26 mmHg  PW Mean 13 mmHg  AO Systolic Pressure 323 mmHg  AO Diastolic Pressure 44 mmHg  AO Mean 65 mmHg  LV Systolic Pressure 557 mmHg  LV Diastolic Pressure 4 mmHg  LV EDP 14 mmHg  AOp Systolic Pressure 322 mmHg  AOp  Diastolic Pressure 53 mmHg  AOp Mean Pressure 69 mmHg  LVp Systolic Pressure 025 mmHg  LVp Diastolic Pressure 3 mmHg  LVp EDP Pressure 13 mmHg  QP/QS 1  TPVR Index 7.55 HRUI  TSVR Index 27.25 HRUI  PVR SVR Ratio 0.12  TPVR/TSVR Ratio 0.28    Cardiac TAVR CT  TECHNIQUE:  The patient was scanned on a Siemens Force 427 slice scanner. A 120  kV retrospective scan was triggered in the descending thoracic aorta  at 111 HU's. Gantry rotation speed was 270 msecs and collimation was  .9 mm. No beta blockade or nitro were given. The 3D data set was  reconstructed in 5% intervals of the R-R cycle. Systolic and  diastolic phases were analyzed on a dedicated work station using  MPR, MIP and VRT modes. The patient received 80 cc of contrast.  FINDINGS:  Aortic Valve:  Aorta: Moderate dilatation of the ascending thoracic aorta, normal  arch vessels with moderate calcific atherosclerosis  Sinotubular Junction: 31 mm  Ascending Thoracic Aorta: Moderately dilated 44 mm  Aortic Arch: 25 mm  Descending Thoracic Aorta: 24 mm  Sinus of Valsalva Measurements:  Non-coronary: 37 mm  Right - coronary: 35.3 mm  Left - coronary: 35.3 mm  Coronary Artery Height above Annulus:  Left Main: 12.6 mm above annulus  Right Coronary: 17.8 mm above annulus  Virtual Basal Annulus Measurements:  Maximum/Minimum Diameter: 29.7 mm x 23.1 mm  Perimeter: 84 mm  Area: 540.6 mm 2  Coronary Arteries: Sufficient height above annulus for deployment  Patent SVG to RCA, Patent SVG to OM1, and patent LIMA to LAD  Optimum Fluoroscopic Angle for Delivery: LAO 16 Caudal 23 degrees  IMPRESSION:  1. Tri- leaflet AV with annular area 540 mm2 upper limit for 26 mm  Sapien 3 valve Despite large sinuses commissural calcium may suggest  smaller valve better than 29 mm valve  2. Optimum angiographic angle for deployment LAO 16 Caudal 23  degrees  3. Moderate aortic root dilatation 4.4 cm  4. Coronary arteries sufficient height  above annulus for deployment  with patent SVG to RCA/OM1 and patent LIMA to LAD  5. LAA has been clipped with no contrast flow into the appendage  Jenkins Rouge  Electronically Signed  By: Jenkins Rouge M.D.  On: 10/13/2019 12:57  CT ANGIOGRAPHY CHEST, ABDOMEN AND PELVIS  TECHNIQUE:  Multidetector CT imaging through the chest, abdomen and pelvis was  performed using the standard protocol during bolus administration of  intravenous contrast. Multiplanar reconstructed images and MIPs were  obtained and reviewed to evaluate the vascular anatomy.  CONTRAST: 70mL OMNIPAQUE IOHEXOL 350 MG/ML  SOLN  COMPARISON: CTA chest 04/29/2017.  FINDINGS:  CTA CHEST FINDINGS  Cardiovascular: Heart size is borderline enlarged. There is no  significant pericardial fluid, thickening or pericardial  calcification. There is aortic atherosclerosis, as well as  atherosclerosis of the great vessels of the mediastinum and the  coronary arteries, including calcified atherosclerotic plaque in the  left main, left anterior descending, left circumflex and right  coronary arteries. Status post median sternotomy for CABG including  LIMA to the LAD. Thickening calcification of the aortic valve.  Mediastinum/Lymph Nodes: No pathologically enlarged mediastinal or  hilar lymph nodes. Please note that accurate exclusion of hilar  adenopathy is limited on noncontrast CT scans. Esophagus is  unremarkable in appearance. No axillary lymphadenopathy.  Lungs/Pleura: No suspicious appearing pulmonary nodules or masses  are noted. No acute consolidative airspace disease. No pleural  effusions.  Musculoskeletal/Soft Tissues: Median sternotomy wires. There are no  aggressive appearing lytic or blastic lesions noted in the  visualized portions of the skeleton.  CTA ABDOMEN AND PELVIS FINDINGS  Hepatobiliary: No suspicious cystic or solid hepatic lesions. No  intra or extrahepatic biliary ductal dilatation. Status post    cholecystectomy.  Pancreas: No pancreatic mass. No pancreatic ductal dilatation. No  pancreatic or peripancreatic fluid collections or inflammatory  changes.  Spleen: Unremarkable.  Adrenals/Urinary Tract: Low-attenuation lesions in the kidneys  bilaterally, compatible with simple cysts, largest of which is in  the upper pole of the right kidney measuring 3 cm in diameter. Mild  bilateral renal atrophy with mild diffuse cortical thinning. No  suspicious renal lesions. Bilateral adrenal glands are normal in  appearance. No hydroureteronephrosis. Urinary bladder is normal in  appearance.  Stomach/Bowel: Normal appearance of the stomach. No pathologic  dilatation of small bowel or colon. Numerous colonic diverticulae  are noted, without surrounding inflammatory changes to suggest an  acute diverticulitis at this time. Normal appendix.  Vascular/Lymphatic: Aortic atherosclerosis, with vascular findings  and measurements pertinent to potential TAVR procedure, as detailed  below. In addition, there is multifocal aneurysmal dilatation and  short segment dissections of both the abdominal aorta and the common  iliac arteries bilaterally. Multifocal fusiform aneurysmal  dilatation of the infrarenal abdominal aorta measuring up to 3.8 x  3.1 cm proximally and 4.1 x 3.0 cm more distally, with multiple  short segment dissection flaps within these regions. Aneurysmal  dilatation of the common iliac arteries measuring 1.9 cm in diameter  on the right and 1.6 cm in diameter on the left. No lymphadenopathy  noted in the abdomen or pelvis.  Reproductive: Prostate gland and seminal vesicles are unremarkable  in appearance.  Other: No significant volume of ascites. No pneumoperitoneum.  Musculoskeletal: There are no aggressive appearing lytic or blastic  lesions noted in the visualized portions of the skeleton.  VASCULAR MEASUREMENTS PERTINENT TO TAVR:  AORTA:  Minimal Aortic Diameter-15 x 11 mm   Severity of Aortic Calcification-severe  RIGHT PELVIS:  Right Common Iliac Artery -  Minimal Diameter-7.9 x 7.1 mm  Tortuosity-mild  Calcification-moderate to severe  Comment-aneurysmally dilated with short segment dissection  Right External Iliac Artery -  Minimal Diameter-9.6 x 8.4 mm  Tortuosity-severe  Calcification-moderate to severe  Right Common Femoral Artery -  Minimal Diameter-7.5 x 7.5 mm  Tortuosity-mild  Calcification-moderate to severe  LEFT PELVIS:  Left Common Iliac Artery -  Minimal Diameter-7.9 x 6.5 mm  Tortuosity-mild  Calcification-moderate to severe  Comment-aneurysmally dilated with short segment dissection  Left External Iliac Artery -  Minimal Diameter-9.6  x 7.6 mm  Tortuosity-moderate  Calcification-moderate to severe  Left Common Femoral Artery -  Minimal Diameter-8.7 x 6.1 mm  Tortuosity-mild  Calcification-moderate to severe  Review of the MIP images confirms the above findings.  IMPRESSION:  1. Vascular findings and measurements pertinent to potential TAVR  procedure, as detailed above. Please note the presence of multiple  aneurysms and short-segment dissections involving the infrarenal  abdominal aorta as well as the common iliac arteries bilaterally.  2. Severe thickening calcification of the aortic valve, compatible  with the reported clinical history of severe aortic stenosis.  3. Aortic atherosclerosis, in addition to left main and 3 vessel  coronary artery disease. Status post median sternotomy for CABG  including LIMA to the LAD.  4. Colonic diverticulosis without evidence of acute diverticulitis  at this time.  5. Additional incidental findings, as above.  Electronically Signed  By: Vinnie Langton M.D.  On: 10/13/2019 12:59  EKG: NSR w/ RBBB and left infra-fascicular bi-fascicular block (09/23/2019)    STS Risk Calculator   Procedure: Isolated AVR   Risk of Mortality: 6.557%  Renal Failure: 9.470%  Permanent Stroke:  2.461%  Prolonged Ventilation: 17.501%  DSW Infection: 0.418%  Reoperation: 4.549%  Morbidity or Mortality: 22.327%  Short Length of Stay: 10.095%  Long Length of Stay: 21.293%    Impression:   Patient has stage D3 paradoxical low flow with preserved ejection fraction severe symptomatic aortic stenosis. He describes progressive symptoms of exertional shortness of breath and fatigue that are likely multifactorial but nonetheless consistent with chronic diastolic congestive heart failure, [place] Heart Association functional class III.  I have personally reviewed both images and the report from the patient's most recent transthoracic echocardiogram performed at Dr. Irven Shelling office. Left ventricular systolic function appears low normal with ejection fraction characterize 50 to 55%. The aortic valve is trileaflet with moderate thickening and severely restricted leaflet mobility involving all 3 leaflets of the aortic valve. There is at least mild aortic insufficiency and pressure half-time was reported 339 ms. Peak velocity across the aortic valve was measured 3.2 m/s corresponding to peak and mean transvalvular gradients estimated 39 and 24 mmHg respectively. Aortic valve area was calculated 0.97 square by VTI. Stroke-volume index and DVI were not reported. Diagnostic cardiac catheterization performed September 29, 2019 confirmed the presence of aortic stenosis with peak to peak and mean transvalvular gradients measured 41 and 33 mmHg respectively. Right heart pressures were minimally elevated. Diagnostic cardiac catheterization revealed severe left main and three-vessel coronary artery disease but continued patency of all 3 bypass grafts placed at the time of the patient's bypass surgery in 2014.   I agree the patient would benefit from aortic valve replacement. However, I would not consider this 86 year old gentleman to be a candidate for for conventional surgery under any circumstances due to his  extremely advanced age, previous coronary artery bypass surgery with patent grafts, numerous comorbid medical problems, and progressively declining physical mobility and functional status. Cardiac-gated CTA of the heart reveals anatomical characteristics consistent with aortic stenosis suitable for treatment by transcatheter aortic valve replacement without any significant complicating features. CTA of the aorta and iliac vessels demonstrates no significant enlargement of the mildly dilated proximal ascending aorta but diffuse atherosclerotic disease throughout the descending aorta with a small infrarenal abdominal aortic aneurysm and multiple areas with localized chronic dissections which would preclude pelvic vascular access for transfemoral approach. The patient's left subclavian and left axillary arteries appear large enough and free of significant disease such that  left trans-subclavian approach appears feasible for transcatheter heart valve deployment with minimal associated risk of disruption or occlusion of the left internal mammary artery graft. Baseline EKG reveals sinus rhythm or sinus bradycardia with chronic right bundle branch block and partial left infra fascicular block such that the patient will be at somewhat increased risk for the development of complete heart block following transcatheter heart valve deployment.   Plan:   The patient and his wife were counseled at length regarding treatment alternatives for management of severe symptomatic aortic stenosis. Alternative approaches such as conventional aortic valve replacement, transcatheter aortic valve replacement, and continued medical therapy without intervention were compared and contrasted at length. The risks associated with conventional surgical aortic valve replacement were discussed in detail, as were expectations for post-operative convalescence, and why I would be reluctant to consider this patient a candidate for conventional  surgery. Issues specific to transcatheter aortic valve replacement were discussed including questions about long term valve durability, the potential for paravalvular leak, possible increased risk of need for permanent pacemaker placement, and other technical complications related to the procedure itself. Long-term prognosis with medical therapy was discussed. This discussion was placed in the context of the patient's own specific clinical presentation and past medical history. All of their questions have been addressed. The patient desires to proceed with transcatheter aortic valve replacement via left subclavian approach.  Following the decision to proceed with transcatheter aortic valve replacement, a discussion has been held regarding what types of management strategies would be attempted intraoperatively in the event of life-threatening complications, including whether or not the patient would be considered a candidate for the use of cardiopulmonary bypass and/or conversion to open sternotomy for attempted surgical intervention. The patient specifically requests that should a potentially life-threatening complication develop we would not attempt emergency median sternotomy and/or other aggressive surgical procedures. The patient has been advised of a variety of complications that might develop including but not limited to risks of death, stroke, paravalvular leak, aortic dissection or other major vascular complications, aortic annulus rupture, device embolization, cardiac rupture or perforation, mitral regurgitation, acute myocardial infarction, arrhythmia, heart block or bradycardia requiring permanent pacemaker placement, congestive heart failure, respiratory failure, renal failure, pneumonia, infection, other late complications related to structural valve deterioration or migration, or other complications that might ultimately cause a temporary or permanent loss of functional independence or other long  term morbidity. The patient provides full informed consent for the procedure as described and all questions were answered.    Gaye Pollack, MD.

## 2019-11-10 ENCOUNTER — Inpatient Hospital Stay (HOSPITAL_COMMUNITY)
Admission: RE | Admit: 2019-11-10 | Discharge: 2019-11-11 | DRG: 266 | Disposition: A | Discharge status: Home / Self Care | Payer: Medicare Other | Attending: Surgery | Admitting: Surgery

## 2019-11-10 ENCOUNTER — Other Ambulatory Visit: Payer: Self-pay

## 2019-11-10 ENCOUNTER — Encounter (HOSPITAL_COMMUNITY): Payer: Self-pay | Admitting: Cardiovascular Disease

## 2019-11-10 ENCOUNTER — Inpatient Hospital Stay (HOSPITAL_COMMUNITY): Payer: Medicare Other

## 2019-11-10 ENCOUNTER — Telehealth: Payer: Self-pay | Admitting: Cardiology

## 2019-11-10 ENCOUNTER — Inpatient Hospital Stay (HOSPITAL_COMMUNITY): Payer: Medicare Other | Admitting: Vascular Surgery

## 2019-11-10 ENCOUNTER — Encounter (HOSPITAL_COMMUNITY)
Admission: RE | Disposition: A | Discharge status: Home / Self Care | Payer: Self-pay | Source: Home / Self Care | Attending: Surgery

## 2019-11-10 DIAGNOSIS — I252 Old myocardial infarction: Secondary | ICD-10-CM

## 2019-11-10 DIAGNOSIS — F0390 Unspecified dementia without behavioral disturbance: Secondary | ICD-10-CM | POA: Diagnosis present

## 2019-11-10 DIAGNOSIS — I714 Abdominal aortic aneurysm, without rupture: Secondary | ICD-10-CM | POA: Diagnosis present

## 2019-11-10 DIAGNOSIS — Z87442 Personal history of urinary calculi: Secondary | ICD-10-CM

## 2019-11-10 DIAGNOSIS — H409 Unspecified glaucoma: Secondary | ICD-10-CM | POA: Diagnosis present

## 2019-11-10 DIAGNOSIS — Z6832 Body mass index (BMI) 32.0-32.9, adult: Secondary | ICD-10-CM

## 2019-11-10 DIAGNOSIS — Z880 Allergy status to penicillin: Secondary | ICD-10-CM

## 2019-11-10 DIAGNOSIS — I480 Paroxysmal atrial fibrillation: Secondary | ICD-10-CM | POA: Diagnosis present

## 2019-11-10 DIAGNOSIS — I452 Bifascicular block: Secondary | ICD-10-CM | POA: Diagnosis present

## 2019-11-10 DIAGNOSIS — Z954 Presence of other heart-valve replacement: Secondary | ICD-10-CM | POA: Diagnosis not present

## 2019-11-10 DIAGNOSIS — I350 Nonrheumatic aortic (valve) stenosis: Principal | ICD-10-CM | POA: Diagnosis present

## 2019-11-10 DIAGNOSIS — K219 Gastro-esophageal reflux disease without esophagitis: Secondary | ICD-10-CM | POA: Diagnosis present

## 2019-11-10 DIAGNOSIS — Z006 Encounter for examination for normal comparison and control in clinical research program: Secondary | ICD-10-CM

## 2019-11-10 DIAGNOSIS — G4733 Obstructive sleep apnea (adult) (pediatric): Secondary | ICD-10-CM | POA: Diagnosis present

## 2019-11-10 DIAGNOSIS — D649 Anemia, unspecified: Secondary | ICD-10-CM | POA: Diagnosis present

## 2019-11-10 DIAGNOSIS — M353 Polymyalgia rheumatica: Secondary | ICD-10-CM | POA: Diagnosis present

## 2019-11-10 DIAGNOSIS — I5032 Chronic diastolic (congestive) heart failure: Secondary | ICD-10-CM | POA: Diagnosis present

## 2019-11-10 DIAGNOSIS — I359 Nonrheumatic aortic valve disorder, unspecified: Secondary | ICD-10-CM

## 2019-11-10 DIAGNOSIS — Z9049 Acquired absence of other specified parts of digestive tract: Secondary | ICD-10-CM

## 2019-11-10 DIAGNOSIS — Z7901 Long term (current) use of anticoagulants: Secondary | ICD-10-CM

## 2019-11-10 DIAGNOSIS — Z952 Presence of prosthetic heart valve: Secondary | ICD-10-CM | POA: Diagnosis not present

## 2019-11-10 DIAGNOSIS — E538 Deficiency of other specified B group vitamins: Secondary | ICD-10-CM | POA: Diagnosis present

## 2019-11-10 DIAGNOSIS — R0609 Other forms of dyspnea: Secondary | ICD-10-CM

## 2019-11-10 DIAGNOSIS — I7121 Aneurysm of the ascending aorta, without rupture: Secondary | ICD-10-CM | POA: Diagnosis present

## 2019-11-10 DIAGNOSIS — I700 Atherosclerosis of aorta: Secondary | ICD-10-CM | POA: Diagnosis present

## 2019-11-10 DIAGNOSIS — M109 Gout, unspecified: Secondary | ICD-10-CM | POA: Diagnosis present

## 2019-11-10 DIAGNOSIS — I872 Venous insufficiency (chronic) (peripheral): Secondary | ICD-10-CM | POA: Diagnosis present

## 2019-11-10 DIAGNOSIS — I77810 Thoracic aortic ectasia: Secondary | ICD-10-CM | POA: Diagnosis present

## 2019-11-10 DIAGNOSIS — E1122 Type 2 diabetes mellitus with diabetic chronic kidney disease: Secondary | ICD-10-CM | POA: Diagnosis present

## 2019-11-10 DIAGNOSIS — Z87891 Personal history of nicotine dependence: Secondary | ICD-10-CM

## 2019-11-10 DIAGNOSIS — I712 Thoracic aortic aneurysm, without rupture: Secondary | ICD-10-CM | POA: Diagnosis present

## 2019-11-10 DIAGNOSIS — E785 Hyperlipidemia, unspecified: Secondary | ICD-10-CM | POA: Diagnosis present

## 2019-11-10 DIAGNOSIS — Z885 Allergy status to narcotic agent status: Secondary | ICD-10-CM

## 2019-11-10 DIAGNOSIS — Z79899 Other long term (current) drug therapy: Secondary | ICD-10-CM

## 2019-11-10 DIAGNOSIS — I130 Hypertensive heart and chronic kidney disease with heart failure and stage 1 through stage 4 chronic kidney disease, or unspecified chronic kidney disease: Secondary | ICD-10-CM | POA: Diagnosis present

## 2019-11-10 DIAGNOSIS — I5033 Acute on chronic diastolic (congestive) heart failure: Secondary | ICD-10-CM | POA: Diagnosis present

## 2019-11-10 DIAGNOSIS — Z951 Presence of aortocoronary bypass graft: Secondary | ICD-10-CM

## 2019-11-10 DIAGNOSIS — Z7952 Long term (current) use of systemic steroids: Secondary | ICD-10-CM

## 2019-11-10 DIAGNOSIS — N400 Enlarged prostate without lower urinary tract symptoms: Secondary | ICD-10-CM | POA: Diagnosis present

## 2019-11-10 DIAGNOSIS — R0600 Dyspnea, unspecified: Secondary | ICD-10-CM

## 2019-11-10 DIAGNOSIS — I2510 Atherosclerotic heart disease of native coronary artery without angina pectoris: Secondary | ICD-10-CM | POA: Diagnosis present

## 2019-11-10 DIAGNOSIS — E669 Obesity, unspecified: Secondary | ICD-10-CM | POA: Diagnosis present

## 2019-11-10 DIAGNOSIS — N184 Chronic kidney disease, stage 4 (severe): Secondary | ICD-10-CM | POA: Diagnosis present

## 2019-11-10 DIAGNOSIS — I25118 Atherosclerotic heart disease of native coronary artery with other forms of angina pectoris: Secondary | ICD-10-CM | POA: Diagnosis present

## 2019-11-10 DIAGNOSIS — I10 Essential (primary) hypertension: Secondary | ICD-10-CM | POA: Diagnosis present

## 2019-11-10 DIAGNOSIS — Z823 Family history of stroke: Secondary | ICD-10-CM

## 2019-11-10 DIAGNOSIS — Z20822 Contact with and (suspected) exposure to covid-19: Secondary | ICD-10-CM | POA: Diagnosis present

## 2019-11-10 HISTORY — DX: Presence of prosthetic heart valve: Z95.2

## 2019-11-10 HISTORY — PX: TEE WITHOUT CARDIOVERSION: SHX5443

## 2019-11-10 HISTORY — PX: TRANSCATHETER AORTIC VALVE REPLACEMENT, TRANSFEMORAL: SHX6400

## 2019-11-10 LAB — POCT I-STAT, CHEM 8
BUN: 26 mg/dL — ABNORMAL HIGH (ref 8–23)
BUN: 25 mg/dL — ABNORMAL HIGH (ref 8–23)
BUN: 25 mg/dL — ABNORMAL HIGH (ref 8–23)
Calcium, Ion: 1.32 mmol/L (ref 1.15–1.40)
Calcium, Ion: 1.29 mmol/L (ref 1.15–1.40)
Calcium, Ion: 1.35 mmol/L (ref 1.15–1.40)
Chloride: 104 mmol/L (ref 98–111)
Chloride: 104 mmol/L (ref 98–111)
Chloride: 106 mmol/L (ref 98–111)
Creatinine, Ser: 1.2 mg/dL (ref 0.61–1.24)
Creatinine, Ser: 1.1 mg/dL (ref 0.61–1.24)
Creatinine, Ser: 1.2 mg/dL (ref 0.61–1.24)
Glucose, Bld: 111 mg/dL — ABNORMAL HIGH (ref 70–99)
Glucose, Bld: 127 mg/dL — ABNORMAL HIGH (ref 70–99)
Glucose, Bld: 148 mg/dL — ABNORMAL HIGH (ref 70–99)
HCT: 26 % — ABNORMAL LOW (ref 39.0–52.0)
HCT: 29 % — ABNORMAL LOW (ref 39.0–52.0)
HCT: 31 % — ABNORMAL LOW (ref 39.0–52.0)
Hemoglobin: 8.8 g/dL — ABNORMAL LOW (ref 13.0–17.0)
Hemoglobin: 9.9 g/dL — ABNORMAL LOW (ref 13.0–17.0)
Hemoglobin: 10.5 g/dL — ABNORMAL LOW (ref 13.0–17.0)
Potassium: 3.8 mmol/L (ref 3.5–5.1)
Potassium: 3.8 mmol/L (ref 3.5–5.1)
Potassium: 3.8 mmol/L (ref 3.5–5.1)
Sodium: 141 mmol/L (ref 135–145)
Sodium: 140 mmol/L (ref 135–145)
Sodium: 140 mmol/L (ref 135–145)
TCO2: 25 mmol/L (ref 22–32)
TCO2: 23 mmol/L (ref 22–32)
TCO2: 24 mmol/L (ref 22–32)

## 2019-11-10 LAB — PREPARE RBC (CROSSMATCH)

## 2019-11-10 LAB — GLUCOSE, CAPILLARY
Glucose-Capillary: 101 mg/dL — ABNORMAL HIGH (ref 70–99)
Glucose-Capillary: 140 mg/dL — ABNORMAL HIGH (ref 70–99)

## 2019-11-10 LAB — ECHO TEE
AR max vel: 1.3 cm2
AV Area VTI: 1.45 cm2
AV Area mean vel: 1.35 cm2
AV Mean grad: 10 mmHg
AV Peak grad: 16.9 mmHg
Ao pk vel: 2.05 m/s

## 2019-11-10 SURGERY — IMPLANTATION, AORTIC VALVE, TRANSCATHETER, SUBCLAVIAN ARTERY APPROACH
Anesthesia: General | Site: Chest

## 2019-11-10 MED ORDER — ONDANSETRON HCL 4 MG/2ML IJ SOLN
INTRAMUSCULAR | Status: AC
  Filled 2019-11-10: qty 2

## 2019-11-10 MED ORDER — LACTATED RINGERS IV SOLN: INTRAVENOUS | Status: DC | PRN

## 2019-11-10 MED ORDER — FENTANYL CITRATE (PF) 100 MCG/2ML IJ SOLN
INTRAMUSCULAR | Status: DC | PRN
  Administered 2019-11-10 (×3): 50 ug via INTRAVENOUS

## 2019-11-10 MED ORDER — SODIUM CHLORIDE 0.9 % IV SOLN: INTRAVENOUS | Status: AC

## 2019-11-10 MED ORDER — OXYCODONE HCL 5 MG PO TABS: 5.0000 mg | ORAL_TABLET | ORAL | Status: DC | PRN

## 2019-11-10 MED ORDER — PHENYLEPHRINE 40 MCG/ML (10ML) SYRINGE FOR IV PUSH (FOR BLOOD PRESSURE SUPPORT)
PREFILLED_SYRINGE | INTRAVENOUS | Status: AC
  Filled 2019-11-10: qty 10

## 2019-11-10 MED ORDER — IODIXANOL 320 MG/ML IV SOLN
INTRAVENOUS | Status: DC | PRN
  Administered 2019-11-10: 150 mL

## 2019-11-10 MED ORDER — NITROGLYCERIN 0.4 MG SL SUBL
SUBLINGUAL_TABLET | SUBLINGUAL | Status: AC
  Filled 2019-11-10: qty 1

## 2019-11-10 MED ORDER — ROCURONIUM BROMIDE 10 MG/ML (PF) SYRINGE
PREFILLED_SYRINGE | INTRAVENOUS | Status: DC | PRN
  Administered 2019-11-10: 60 mg via INTRAVENOUS
  Administered 2019-11-10 (×3): 20 mg via INTRAVENOUS

## 2019-11-10 MED ORDER — ONDANSETRON HCL 4 MG/2ML IJ SOLN
INTRAMUSCULAR | Status: DC | PRN
  Administered 2019-11-10: 4 mg via INTRAVENOUS

## 2019-11-10 MED ORDER — HEPARIN SODIUM (PORCINE) 1000 UNIT/ML IJ SOLN
INTRAMUSCULAR | Status: AC
  Filled 2019-11-10: qty 1

## 2019-11-10 MED ORDER — DEXAMETHASONE SODIUM PHOSPHATE 10 MG/ML IJ SOLN
INTRAMUSCULAR | Status: AC
  Filled 2019-11-10: qty 1

## 2019-11-10 MED ORDER — 0.9 % SODIUM CHLORIDE (POUR BTL) OPTIME
TOPICAL | Status: DC | PRN
  Administered 2019-11-10 (×2): 1000 mL

## 2019-11-10 MED ORDER — SODIUM CHLORIDE 0.9% FLUSH
3.0000 mL | Freq: Two times a day (BID) | INTRAVENOUS | Status: DC
  Administered 2019-11-11: 3 mL via INTRAVENOUS

## 2019-11-10 MED ORDER — ROCURONIUM BROMIDE 10 MG/ML (PF) SYRINGE
PREFILLED_SYRINGE | INTRAVENOUS | Status: AC
  Filled 2019-11-10: qty 20

## 2019-11-10 MED ORDER — CHLORHEXIDINE GLUCONATE 0.12 % MT SOLN
15.0000 mL | Freq: Once | OROMUCOSAL | Status: AC
  Administered 2019-11-10: 15 mL via OROMUCOSAL
  Filled 2019-11-10: qty 15

## 2019-11-10 MED ORDER — METOPROLOL TARTRATE 12.5 MG HALF TABLET
12.5000 mg | ORAL_TABLET | Freq: Two times a day (BID) | ORAL | Status: DC
  Administered 2019-11-10 – 2019-11-11 (×3): 12.5 mg via ORAL
  Filled 2019-11-10 (×3): qty 1

## 2019-11-10 MED ORDER — ACETAMINOPHEN 650 MG RE SUPP: 650.0000 mg | Freq: Four times a day (QID) | RECTAL | Status: DC | PRN

## 2019-11-10 MED ORDER — MEMANTINE HCL 10 MG PO TABS
10.0000 mg | ORAL_TABLET | Freq: Two times a day (BID) | ORAL | Status: DC
  Administered 2019-11-10 – 2019-11-11 (×2): 10 mg via ORAL
  Filled 2019-11-10 (×2): qty 1

## 2019-11-10 MED ORDER — CHLORHEXIDINE GLUCONATE CLOTH 2 % EX PADS: 6.0000 | MEDICATED_PAD | Freq: Every day | CUTANEOUS | Status: DC

## 2019-11-10 MED ORDER — CHLORHEXIDINE GLUCONATE 4 % EX LIQD: 30.0000 mL | CUTANEOUS | Status: DC

## 2019-11-10 MED ORDER — CHLORHEXIDINE GLUCONATE 4 % EX LIQD: 60.0000 mL | Freq: Once | CUTANEOUS | Status: DC

## 2019-11-10 MED ORDER — SODIUM CHLORIDE 0.9 % IV SOLN: INTRAVENOUS | Status: DC

## 2019-11-10 MED ORDER — HEPARIN SODIUM (PORCINE) 1000 UNIT/ML IJ SOLN
INTRAMUSCULAR | Status: DC | PRN
  Administered 2019-11-10: 16000 [IU] via INTRAVENOUS

## 2019-11-10 MED ORDER — TRAMADOL HCL 50 MG PO TABS: 50.0000 mg | ORAL_TABLET | ORAL | Status: DC | PRN

## 2019-11-10 MED ORDER — PHENYLEPHRINE HCL-NACL 20-0.9 MG/250ML-% IV SOLN
0.0000 ug/min | INTRAVENOUS | Status: DC
  Filled 2019-11-10: qty 250

## 2019-11-10 MED ORDER — SUGAMMADEX SODIUM 200 MG/2ML IV SOLN
INTRAVENOUS | Status: DC | PRN
  Administered 2019-11-10: 200 mg via INTRAVENOUS

## 2019-11-10 MED ORDER — PROPOFOL 10 MG/ML IV BOLUS
INTRAVENOUS | Status: DC | PRN
  Administered 2019-11-10: 130 mg via INTRAVENOUS

## 2019-11-10 MED ORDER — PROPOFOL 10 MG/ML IV BOLUS
INTRAVENOUS | Status: AC
  Filled 2019-11-10: qty 20

## 2019-11-10 MED ORDER — LEVOFLOXACIN IN D5W 750 MG/150ML IV SOLN
750.0000 mg | INTRAVENOUS | Status: AC
  Administered 2019-11-11: 750 mg via INTRAVENOUS
  Filled 2019-11-10: qty 150

## 2019-11-10 MED ORDER — NITROGLYCERIN IN D5W 200-5 MCG/ML-% IV SOLN: 0.0000 ug/min | INTRAVENOUS | Status: DC

## 2019-11-10 MED ORDER — LIDOCAINE HCL 1 % IJ SOLN
INTRAMUSCULAR | Status: AC
  Filled 2019-11-10: qty 20

## 2019-11-10 MED ORDER — PANTOPRAZOLE SODIUM 40 MG PO TBEC: 40.0000 mg | DELAYED_RELEASE_TABLET | ORAL | Status: DC

## 2019-11-10 MED ORDER — SODIUM CHLORIDE 0.9 % IV SOLN
INTRAVENOUS | Status: DC | PRN
  Administered 2019-11-10: 2000 mL

## 2019-11-10 MED ORDER — SODIUM CHLORIDE 0.9% FLUSH: 3.0000 mL | INTRAVENOUS | Status: DC | PRN

## 2019-11-10 MED ORDER — GALANTAMINE HYDROBROMIDE ER 8 MG PO CP24
8.0000 mg | ORAL_CAPSULE | Freq: Every day | ORAL | Status: DC
  Administered 2019-11-11: 8 mg via ORAL
  Filled 2019-11-10: qty 1

## 2019-11-10 MED ORDER — NITROGLYCERIN 0.4 MG SL SUBL
0.4000 mg | SUBLINGUAL_TABLET | SUBLINGUAL | Status: DC | PRN
  Administered 2019-11-10: 0.4 mg via SUBLINGUAL

## 2019-11-10 MED ORDER — SODIUM CHLORIDE 0.9 % IV SOLN: 250.0000 mL | INTRAVENOUS | Status: DC | PRN

## 2019-11-10 MED ORDER — ONDANSETRON HCL 4 MG/2ML IJ SOLN: 4.0000 mg | Freq: Four times a day (QID) | INTRAMUSCULAR | Status: DC | PRN

## 2019-11-10 MED ORDER — MORPHINE SULFATE (PF) 2 MG/ML IV SOLN: 1.0000 mg | INTRAVENOUS | Status: DC | PRN

## 2019-11-10 MED ORDER — PROTAMINE SULFATE 10 MG/ML IV SOLN
INTRAVENOUS | Status: AC
  Filled 2019-11-10: qty 25

## 2019-11-10 MED ORDER — ROSUVASTATIN CALCIUM 5 MG PO TABS
10.0000 mg | ORAL_TABLET | Freq: Every day | ORAL | Status: DC
  Administered 2019-11-10 – 2019-11-11 (×2): 10 mg via ORAL
  Filled 2019-11-10 (×2): qty 2

## 2019-11-10 MED ORDER — PHENYLEPHRINE 40 MCG/ML (10ML) SYRINGE FOR IV PUSH (FOR BLOOD PRESSURE SUPPORT)
PREFILLED_SYRINGE | INTRAVENOUS | Status: DC | PRN
  Administered 2019-11-10 (×2): 80 ug via INTRAVENOUS
  Administered 2019-11-10: 40 ug via INTRAVENOUS

## 2019-11-10 MED ORDER — CHLORHEXIDINE GLUCONATE 0.12 % MT SOLN: 15.0000 mL | Freq: Once | OROMUCOSAL | Status: DC

## 2019-11-10 MED ORDER — PREDNISONE 1 MG PO TABS
3.0000 mg | ORAL_TABLET | Freq: Every day | ORAL | Status: DC
  Administered 2019-11-11: 3 mg via ORAL
  Filled 2019-11-10: qty 3

## 2019-11-10 MED ORDER — PHENYLEPHRINE HCL-NACL 10-0.9 MG/250ML-% IV SOLN
INTRAVENOUS | Status: DC | PRN
  Administered 2019-11-10: 25 ug/min via INTRAVENOUS

## 2019-11-10 MED ORDER — ASPIRIN 81 MG PO CHEW
81.0000 mg | CHEWABLE_TABLET | Freq: Every day | ORAL | Status: DC
  Administered 2019-11-11: 81 mg via ORAL
  Filled 2019-11-10: qty 1

## 2019-11-10 MED ORDER — SODIUM CHLORIDE 0.9 % IV SOLN
INTRAVENOUS | Status: AC
  Filled 2019-11-10 (×3): qty 1.2

## 2019-11-10 MED ORDER — FENTANYL CITRATE (PF) 250 MCG/5ML IJ SOLN
INTRAMUSCULAR | Status: AC
  Filled 2019-11-10: qty 5

## 2019-11-10 MED ORDER — DEXAMETHASONE SODIUM PHOSPHATE 10 MG/ML IJ SOLN
INTRAMUSCULAR | Status: DC | PRN
  Administered 2019-11-10: 5 mg via INTRAVENOUS

## 2019-11-10 MED ORDER — ACETAMINOPHEN 325 MG PO TABS
650.0000 mg | ORAL_TABLET | Freq: Four times a day (QID) | ORAL | Status: DC | PRN
  Administered 2019-11-10 – 2019-11-11 (×2): 650 mg via ORAL
  Filled 2019-11-10: qty 2

## 2019-11-10 MED ORDER — ORAL CARE MOUTH RINSE: 15.0000 mL | Freq: Once | OROMUCOSAL | Status: AC

## 2019-11-10 MED ORDER — PROTAMINE SULFATE 10 MG/ML IV SOLN
INTRAVENOUS | Status: DC | PRN
  Administered 2019-11-10 (×4): 30 mg via INTRAVENOUS
  Administered 2019-11-10: 40 mg via INTRAVENOUS

## 2019-11-10 MED ORDER — VANCOMYCIN HCL IN DEXTROSE 1-5 GM/200ML-% IV SOLN
1000.0000 mg | Freq: Once | INTRAVENOUS | Status: AC
  Administered 2019-11-10: 1000 mg via INTRAVENOUS
  Filled 2019-11-10: qty 200

## 2019-11-10 MED ORDER — EZETIMIBE 10 MG PO TABS
10.0000 mg | ORAL_TABLET | Freq: Every day | ORAL | Status: DC
  Administered 2019-11-10 – 2019-11-11 (×2): 10 mg via ORAL
  Filled 2019-11-10 (×2): qty 1

## 2019-11-10 MED ORDER — LACTATED RINGERS IV SOLN: INTRAVENOUS | Status: DC

## 2019-11-10 MED ORDER — SODIUM CHLORIDE 0.9 % IV SOLN: INTRAVENOUS | Status: DC | PRN

## 2019-11-10 SURGICAL SUPPLY — 98 items
ADH SKN CLS APL DERMABOND .7 (GAUZE/BANDAGES/DRESSINGS) ×2
BAG DECANTER FOR FLEXI CONT (MISCELLANEOUS) ×1 IMPLANT
BAG SNAP BAND KOVER 36X36 (MISCELLANEOUS) ×3 IMPLANT
BLADE CLIPPER SURG (BLADE) IMPLANT
BLADE OSCILLATING /SAGITTAL (BLADE) IMPLANT
BLADE STERNUM SYSTEM 6 (BLADE) IMPLANT
CABLE ADAPT CONN TEMP 6FT (ADAPTER) ×3 IMPLANT
CANNULA FEM VENOUS REMOTE 22FR (CANNULA) IMPLANT
CANNULA OPTISITE PERFUSION 16F (CANNULA) IMPLANT
CANNULA OPTISITE PERFUSION 18F (CANNULA) IMPLANT
CATH DIAG EXPO 6F AL1 (CATHETERS) IMPLANT
CATH DIAG EXPO 6F FR4 (CATHETERS) ×1 IMPLANT
CATH DIAG EXPO 6F VENT PIG 145 (CATHETERS) ×6 IMPLANT
CATH EXTERNAL FEMALE PUREWICK (CATHETERS) IMPLANT
CATH INFINITI 6F AL2 (CATHETERS) IMPLANT
CATH S G BIP PACING (CATHETERS) ×3 IMPLANT
CLIP VESOCCLUDE MED 24/CT (CLIP) IMPLANT
CLIP VESOCCLUDE SM WIDE 24/CT (CLIP) ×1 IMPLANT
CLOSURE MYNX CONTROL 6F/7F (Vascular Products) ×1 IMPLANT
CNTNR URN SCR LID CUP LEK RST (MISCELLANEOUS) ×4 IMPLANT
CONT SPEC 4OZ STRL OR WHT (MISCELLANEOUS) ×6
COVER BACK TABLE 80X110 HD (DRAPES) ×6 IMPLANT
COVER DOME SNAP 22 D (MISCELLANEOUS) IMPLANT
COVER WAND RF STERILE (DRAPES) ×2 IMPLANT
DERMABOND ADVANCED (GAUZE/BANDAGES/DRESSINGS) ×1
DERMABOND ADVANCED .7 DNX12 (GAUZE/BANDAGES/DRESSINGS) ×2 IMPLANT
DEVICE INFLATION ATRION QL2530 (MISCELLANEOUS) ×1 IMPLANT
DRAPE INCISE IOBAN 66X45 STRL (DRAPES) ×1 IMPLANT
DRSG TEGADERM 4X4.75 (GAUZE/BANDAGES/DRESSINGS) ×5 IMPLANT
ELECT CAUTERY BLADE 6.4 (BLADE) IMPLANT
ELECT REM PT RETURN 9FT ADLT (ELECTROSURGICAL) ×3
ELECTRODE REM PT RTRN 9FT ADLT (ELECTROSURGICAL) ×4 IMPLANT
FELT TEFLON 6X6 (MISCELLANEOUS) IMPLANT
FEMORAL VENOUS CANN RAP (CANNULA) IMPLANT
GAUZE SPONGE 4X4 12PLY STRL (GAUZE/BANDAGES/DRESSINGS) ×3 IMPLANT
GAUZE SPONGE 4X4 12PLY STRL LF (GAUZE/BANDAGES/DRESSINGS) ×1 IMPLANT
GLOVE BIO SURGEON STRL SZ 6.5 (GLOVE) ×3 IMPLANT
GLOVE BIO SURGEON STRL SZ7.5 (GLOVE) ×1 IMPLANT
GLOVE BIO SURGEON STRL SZ8 (GLOVE) IMPLANT
GLOVE BIOGEL PI IND STRL 6.5 (GLOVE) IMPLANT
GLOVE BIOGEL PI IND STRL 7.5 (GLOVE) IMPLANT
GLOVE BIOGEL PI INDICATOR 6.5 (GLOVE) ×2
GLOVE BIOGEL PI INDICATOR 7.5 (GLOVE) ×1
GLOVE EUDERMIC 7 POWDERFREE (GLOVE) IMPLANT
GLOVE ORTHO TXT STRL SZ7.5 (GLOVE) IMPLANT
GOWN STRL REUS W/ TWL LRG LVL3 (GOWN DISPOSABLE) IMPLANT
GOWN STRL REUS W/ TWL XL LVL3 (GOWN DISPOSABLE) ×2 IMPLANT
GOWN STRL REUS W/TWL LRG LVL3 (GOWN DISPOSABLE) ×6
GOWN STRL REUS W/TWL XL LVL3 (GOWN DISPOSABLE) ×15
GUIDEWIRE SAFE TJ AMPLATZ EXST (WIRE) ×3 IMPLANT
INSERT FOGARTY SM (MISCELLANEOUS) IMPLANT
KIT BASIN OR (CUSTOM PROCEDURE TRAY) ×3 IMPLANT
KIT DILATOR VASC 18G NDL (KITS) IMPLANT
KIT HEART LEFT (KITS) ×3 IMPLANT
KIT SUCTION CATH 14FR (SUCTIONS) ×1 IMPLANT
KIT TURNOVER KIT B (KITS) ×3 IMPLANT
LOOP VESSEL MAXI BLUE (MISCELLANEOUS) ×1 IMPLANT
LOOP VESSEL MINI RED (MISCELLANEOUS) ×1 IMPLANT
NDL PERC 18GX7CM (NEEDLE) ×2 IMPLANT
NEEDLE 22X1 1/2 (OR ONLY) (NEEDLE) IMPLANT
NEEDLE PERC 18GX7CM (NEEDLE) ×3 IMPLANT
NS IRRIG 1000ML POUR BTL (IV SOLUTION) ×8 IMPLANT
PACK ENDOVASCULAR (PACKS) ×3 IMPLANT
PAD ARMBOARD 7.5X6 YLW CONV (MISCELLANEOUS) ×6 IMPLANT
PAD ELECT DEFIB RADIOL ZOLL (MISCELLANEOUS) ×3 IMPLANT
PENCIL BUTTON HOLSTER BLD 10FT (ELECTRODE) ×3 IMPLANT
POSITIONER HEAD DONUT 9IN (MISCELLANEOUS) ×3 IMPLANT
SHEATH 14X36 EDWARDS (SHEATH) ×1 IMPLANT
SHEATH BRITE TIP 7FR 35CM (SHEATH) ×3 IMPLANT
SHEATH PINNACLE 6F 10CM (SHEATH) ×3 IMPLANT
SHEATH PINNACLE 8F 10CM (SHEATH) ×3 IMPLANT
SLEEVE REPOSITIONING LENGTH 30 (MISCELLANEOUS) ×3 IMPLANT
SPONGE LAP 4X18 RFD (DISPOSABLE) ×1 IMPLANT
STOPCOCK MORSE 400PSI 3WAY (MISCELLANEOUS) ×6 IMPLANT
SUT ETHIBOND X763 2 0 SH 1 (SUTURE) IMPLANT
SUT GORETEX CV 4 TH 22 36 (SUTURE) IMPLANT
SUT GORETEX CV4 TH-18 (SUTURE) ×3 IMPLANT
SUT MNCRL AB 3-0 PS2 18 (SUTURE) IMPLANT
SUT PROLENE 5 0 C 1 36 (SUTURE) ×1 IMPLANT
SUT PROLENE 6 0 C 1 30 (SUTURE) IMPLANT
SUT SILK  1 MH (SUTURE) ×3
SUT SILK 1 MH (SUTURE) ×2 IMPLANT
SUT VIC AB 2-0 CT1 27 (SUTURE) ×3
SUT VIC AB 2-0 CT1 TAPERPNT 27 (SUTURE) IMPLANT
SUT VIC AB 2-0 CTX 36 (SUTURE) IMPLANT
SUT VIC AB 3-0 SH 8-18 (SUTURE) ×1 IMPLANT
SYR 30ML LL (SYRINGE) ×1 IMPLANT
SYR 50ML LL SCALE MARK (SYRINGE) ×3 IMPLANT
SYR BULB IRRIG 60ML STRL (SYRINGE) IMPLANT
SYR CONTROL 10ML LL (SYRINGE) IMPLANT
TOWEL GREEN STERILE (TOWEL DISPOSABLE) ×3 IMPLANT
TOWEL GREEN STERILE FF (TOWEL DISPOSABLE) ×4 IMPLANT
TRANSDUCER W/STOPCOCK (MISCELLANEOUS) ×6 IMPLANT
TRAY FOLEY SLVR 16FR TEMP STAT (SET/KITS/TRAYS/PACK) ×1 IMPLANT
VALVE 26 ULTRA SAPIEN KIT (Valve) ×1 IMPLANT
WIRE EMERALD 3MM-J .035X150CM (WIRE) ×3 IMPLANT
WIRE EMERALD 3MM-J .035X260CM (WIRE) ×3 IMPLANT
WIRE HI TORQ VERSACORE J 260CM (WIRE) ×1 IMPLANT

## 2019-11-10 NOTE — Discharge Instructions (Signed)
ACTIVITY AND EXERCISE °• Daily activity and exercise are an important part of your recovery. People recover at different rates depending on their general health and type of valve procedure. °• Most people recovering from TAVR feel better relatively quickly  °• No lifting, pushing, pulling more than 10 pounds (examples to avoid: groceries, vacuuming, gardening, golfing): °            - For one week with a procedure through the groin. °            - For six weeks for procedures through the chest wall or neck. °NOTE: You will typically see one of our providers 7-14 days after your procedure to discuss WHEN TO RESUME the above activities.  °  °  °DRIVING °• Do not drive until you are seen for follow up and cleared by a provider. Generally, we ask patient to not drive for 1 week after their procedure. °• If you have been told by your doctor in the past that you may not drive, you must talk with him/her before you begin driving again. °  °DRESSING °• Groin site: you may leave the clear dressing over the site for up to one week or until it falls off. °  °HYGIENE °• If you had a femoral (leg) procedure, you may take a shower when you return home. After the shower, pat the site dry. Do NOT use powder, oils or lotions in your groin area until the site has completely healed. °• If you had a chest procedure, you may shower when you return home unless specifically instructed not to by your discharging practitioner. °            - DO NOT scrub incision; pat dry with a towel. °            - DO NOT apply any lotions, oils, powders to the incision. °            - No tub baths / swimming for at least 2 weeks. °• If you notice any fevers, chills, increased pain, swelling, bleeding or pus, please contact your doctor. °  °ADDITIONAL INFORMATION °• If you are going to have an upcoming dental procedure, please contact our office as you will require antibiotics ahead of time to prevent infection on your heart valve.  ° ° °If you have any  questions or concerns you can call the structural heart phone during normal business hours 8am-4pm. If you have an urgent need after hours or weekends please call [phone] to talk to the on call provider for general cardiology. If you have an emergency that requires immediate attention, please call 911.  ° ° °After TAVR Checklist ° °Check  Test Description  ° Follow up appointment in 1-2 weeks  You will see our structural heart physician assistant, Katie Bueford Arp. Your incision sites will be checked and you will be cleared to drive and resume all normal activities if you are doing well.    ° 1 month echo and follow up  You will have an echo to check on your new heart valve and be seen back in the office by Katie Sariah Henkin. Many times the echo is not read by your appointment time, but Katie will call you later that day or the following day to report your results.  ° Follow up with your primary cardiologist You will need to be seen by your primary cardiologist in the following 3-6 months after your 1 month appointment in the valve   clinic. Often times your Plavix or Aspirin will be discontinued during this time, but this is decided on a case by case basis.   ° 1 year echo and follow up You will have another echo to check on your heart valve after 1 year and be seen back in the office by Katie Barnes Florek. This your last structural heart visit.  ° Bacterial endocarditis prophylaxis  You will have to take antibiotics for the rest of your life before all dental procedures (even teeth cleanings) to protect your heart valve. Antibiotics are also required before some surgeries. Please check with your cardiologist before scheduling any surgeries. Also, please make sure to tell us if you have a penicillin allergy as you will require an alternative antibiotic.   ° ° °

## 2019-11-10 NOTE — Op Note (Signed)
[facility]   MULTIDISCIPLINARY HEART VALVE TEAM   TAVR OPERATIVE NOTE   Date of Procedure:  11/10/2019  Preoperative Diagnosis: Severe Aortic Stenosis   Postoperative Diagnosis: Same   Procedure:    Transcatheter Aortic Valve Replacement - Left subclavian artery approach  Edwards Sapien 3 Ultra THV (size 26 mm, model # [id number], serial # [id number])   Co-Surgeons:  Gaye Pollack, MD and Lauree Chandler, MD   Anesthesiologist:  A. Hodierne, MD  Echocardiographer:  Jerilynn Mages. Croitoru, MD  Pre-operative Echo Findings:  Severe aortic stenosis  Normal left ventricular systolic function  Post-operative Echo Findings:  Trivial paravalvular leak  Normal left ventricular systolic function   BRIEF CLINICAL NOTE AND INDICATIONS FOR SURGERY  Patient has stage D3 paradoxical low flow with preserved ejection fraction severe symptomatic aortic stenosis. He describes progressive symptoms of exertional shortness of breath and fatigue that are likely multifactorial but nonetheless consistent with chronic diastolic congestive heart failure, New York Heart Association functional class III.  I have personally reviewed both images and the report from the patient's most recent transthoracic echocardiogram performed at Dr. Irven Shelling office. Left ventricular systolic function appears low normal with ejection fraction characterize 50 to 55%. The aortic valve is trileaflet with moderate thickening and severely restricted leaflet mobility involving all 3 leaflets of the aortic valve. There is at least mild aortic insufficiency and pressure half-time was reported 339 ms. Peak velocity across the aortic valve was measured 3.2 m/s corresponding to peak and mean transvalvular gradients estimated 39 and 24 mmHg respectively. Aortic valve area was calculated 0.97 square by VTI. Stroke-volume index and DVI were not reported. Diagnostic cardiac catheterization performed September 29, 2019 confirmed  the presence of aortic stenosis with peak to peak and mean transvalvular gradients measured 41 and 33 mmHg respectively. Right heart pressures were minimally elevated. Diagnostic cardiac catheterization revealed severe left main and three-vessel coronary artery disease but continued patency of all 3 bypass grafts placed at the time of the patient's bypass surgery in 2014.   I agree the patient would benefit from aortic valve replacement. However, I would not consider this 86 year old gentleman to be a candidate for for conventional surgery under any circumstances due to his extremely advanced age, previous coronary artery bypass surgery with patent grafts, numerous comorbid medical problems, and progressively declining physical mobility and functional status. Cardiac-gated CTA of the heart reveals anatomical characteristics consistent with aortic stenosis suitable for treatment by transcatheter aortic valve replacement without any significant complicating features. CTA of the aorta and iliac vessels demonstrates no significant enlargement of the mildly dilated proximal ascending aorta but diffuse atherosclerotic disease throughout the descending aorta with a small infrarenal abdominal aortic aneurysm and multiple areas with localized chronic dissections which would preclude pelvic vascular access for transfemoral approach. The patient's left subclavian and left axillary arteries appear large enough and free of significant disease such that left trans-subclavian approach appears feasible for transcatheter heart valve deployment with minimal associated risk of disruption or occlusion of the left internal mammary artery graft. Baseline EKG reveals sinus rhythm or sinus bradycardia with chronic right bundle branch block and partial left infra fascicular block such that the patient will be at somewhat increased risk for the development of complete heart block following transcatheter heart valve deployment.   The  patient and his wife were counseled at length regarding treatment alternatives for management of severe symptomatic aortic stenosis. Alternative approaches such as conventional aortic valve replacement, transcatheter aortic valve replacement, and continued medical  therapy without intervention were compared and contrasted at length. The risks associated with conventional surgical aortic valve replacement were discussed in detail, as were expectations for post-operative convalescence, and why I would be reluctant to consider this patient a candidate for conventional surgery. Issues specific to transcatheter aortic valve replacement were discussed including questions about long term valve durability, the potential for paravalvular leak, possible increased risk of need for permanent pacemaker placement, and other technical complications related to the procedure itself. Long-term prognosis with medical therapy was discussed. This discussion was placed in the context of the patient's own specific clinical presentation and past medical history. All of their questions have been addressed. The patient desires to proceed with transcatheter aortic valve replacement via left subclavian approach.  Following the decision to proceed with transcatheter aortic valve replacement, a discussion has been held regarding what types of management strategies would be attempted intraoperatively in the event of life-threatening complications, including whether or not the patient would be considered a candidate for the use of cardiopulmonary bypass and/or conversion to open sternotomy for attempted surgical intervention. The patient specifically requests that should a potentially life-threatening complication develop we would not attempt emergency median sternotomy and/or other aggressive surgical procedures. The patient has been advised of a variety of complications that might develop including but not limited to risks of death, stroke,  paravalvular leak, aortic dissection or other major vascular complications, aortic annulus rupture, device embolization, cardiac rupture or perforation, mitral regurgitation, acute myocardial infarction, arrhythmia, heart block or bradycardia requiring permanent pacemaker placement, congestive heart failure, respiratory failure, renal failure, pneumonia, infection, other late complications related to structural valve deterioration or migration, or other complications that might ultimately cause a temporary or permanent loss of functional independence or other long term morbidity. The patient provides full informed consent for the procedure as described and all questions were answered.     DETAILS OF THE OPERATIVE PROCEDURE  PREPARATION:    The patient was brought to the operating room on the above mentioned date and appropriate monitoring was established by the anesthesia team. The patient was placed in the supine position on the operating table.  Intravenous antibiotics were administered. The patient was monitored closely throughout the procedure under general anesthesia.   Baseline transesophageal echocardiogram was performed. The patient's abdomen and both groins were prepped and draped in a sterile manner. A time out procedure was performed.   PERIPHERAL ACCESS:    Using the modified Seldinger technique, femoral arterial and venous access was obtained with placement of 6 Fr sheaths on the right side.  A pigtail diagnostic catheter was passed through the right arterial sheath under fluoroscopic guidance into the aortic root.  A temporary transvenous pacemaker catheter was passed through the right femoral venous sheath under fluoroscopic guidance into the right ventricle.  The pacemaker was tested to ensure stable lead placement and pacemaker capture. Aortic root angiography was performed in order to determine the optimal angiographic angle for valve deployment.  LEFT SUBCLAVIAN ACCESS:   A  transverse incision was made below the left clavicle and carried down through the subcutaneous tissue using electrocautery. The pectoralis major muscle was split along its fibers and the pectoralis minor muscle retracted laterally. The left axillary artery was identified and encircled with a vessel loop. The patient was heparinized systemically and ACT verified > 250 seconds.  A double concentric purse string suture of CV-4 gortex was placed in the anterior wall of the artery. The artery was cannulated with a needle and a  J- wire advanced into the ascending aorta. An 8 F sheath was inserted over the wire. The aortic valve was crossed with a JR4 catheter and a straight wire. This was exchanged for a pigtail catheter and position was confirmed in the LV apex. The pigtail catheter was exchanged for an Amplatz Extra-stiff wire in the LV apex.  Then a 68 F E-sheath was inserted into the axillary artery and the tip advanced into the aortic arch.  BALLOON AORTIC VALVULOPLASTY:   Not performed.  TRANSCATHETER HEART VALVE DEPLOYMENT:   An Edwards Sapien 3 Ultra transcatheter heart valve (size 26 mm) was prepared and crimped per manufacturer's guidelines, and the proper orientation of the valve is confirmed on the [employer] delivery system. The valve was advanced through the introducer sheath using normal technique until in an appropriate position in the ascending aorta beyond the sheath tip. The balloon was then retracted and using the fine-tuning wheel was centered on the valve. The valve was then advanced across the aortic arch using appropriate flexion of the catheter. The valve was carefully positioned across the aortic valve annulus. The Commander catheter was retracted using normal technique. Once final position of the valve has been confirmed by angiographic assessment, the valve is deployed while temporarily holding ventilation and during rapid ventricular pacing to maintain systolic blood pressure <  50 mmHg and pulse pressure < 10 mmHg. The balloon inflation is held for >3 seconds after reaching full deployment volume. Once the balloon has fully deflated the balloon is retracted into the ascending aorta and valve function is assessed using echocardiography. There is felt to be trivial paravalvular leak and no central aortic insufficiency. Post-procedural gradients were acceptable. The patient's hemodynamic recovery following valve deployment is good.  The deployment balloon and guidewire are both removed.    PROCEDURE COMPLETION:   The sheath was removed and subclavian artery closure performed.  Protamine was administered once subclavian arterial closure was complete. The temporary pacemaker remained in place. The pigtail catheter and femoral sheath were removed with a Mynx femoral closure device utilized following removal of the diagnostic sheath in the right femoral artery. The left subclavian incision was closed in layers using 2-0 vicryl suture for the muscle, 3-0 vicryl suture for the subcutaneous tissue and 3-0 vicryl subcuticular skin closure. Dermabond was applied.  The patient tolerated the procedure well and was transported to the cath lab recovery area in stable condition. There were no immediate intraoperative complications. All sponge instrument and needle counts are verified correct at completion of the operation.   No blood products were administered during the operation.  The patient received a total of 40.3 mL of intravenous contrast during the procedure.   Gaye Pollack, MD 11/10/2019

## 2019-11-10 NOTE — CV Procedure (Signed)
[facility]  TAVR OPERATIVE NOTE   Date of Procedure:  11/10/2019  Preoperative Diagnosis: Severe Aortic Stenosis   Postoperative Diagnosis: Same   Procedure:    Transcatheter Aortic Valve Replacement - Transfemoral Approach  Edwards Sapien 3 THV (size 26 mm, model # [id number], serial # [id number])   Co-Surgeons:  Lauree Chandler, MD and Gaye Pollack, MD   Anesthesiologist:  Hodierne  Echocardiographer:  Croitoru  Pre-operative Echo Findings:  Severe aortic stenosis  Normal left ventricular systolic function  Post-operative Echo Findings:  Trivial paravalvular leak  Normal left ventricular systolic function  BRIEF CLINICAL NOTE AND INDICATIONS FOR SURGERY   86 yo male with history of CAD s/p 3V CABG, HTN, atrial fibrillation s/p maze procedure, HLD, DM2, sleep apnea and polymyalgia rheumatica who is here today for TAVR. His aortic stenosis has been followed by Dr. Einar Gip. Echo in September 2021 showed LVEF=50-55%. The aortic valve appeared to have severe stenosis with thickened, calcified leaflets with limited leaflet excursion. Mean gradient was 27 mmHg with peak gradient 40 mmHg and AVA 0.97cm2. This was felt to represent paradoxical low flow, low gradient severe AS. There was mild MR and mild to moderate TR. The patient has complained of worsened exertional dyspnea and fatigue felt to be due to his aortic stenosis. He was referred to see Dr. Roxy Manns on our TAVR team in the CT surgery office and TAVR workup was completed. Cardiac catheterization was performed on 09/29/19 with mean gradient of 33 mHg, peak to peak gradient 41 mmHg and AVA 1.09 cm2. He had three vessel native vessel disease but all three bypass grafts were patent. Gated cardiac CTA with anatomy suitable for a 26 mm Edwards Sapient 3 Ultra valve.There are dissections in his distal aorta and iliofemoral vessel which would preclude vascular access from the transfemoral approach. A left subclavian  approach would be used. Echo 10/27/19 with LVEF=60-65%. The aortic valve leaflets are thickened and calcified with limited leaflet excursion. Mean gradient 28.8 mmHg, peak gradient 52.1 mmHg, AVA 1.27 cm2, dimensionless index 0.26.   During the course of the patient's preoperative work up they have been evaluated comprehensively by a multidisciplinary team of specialists coordinated through the [facility].  They have been demonstrated to suffer from symptomatic severe aortic stenosis as noted above. The patient has been counseled extensively as to the relative risks and benefits of all options for the treatment of severe aortic stenosis including long term medical therapy, conventional surgery for aortic valve replacement, and transcatheter aortic valve replacement.  The patient has been independently evaluated by Dr. Roxy Manns with CT surgery and they are felt to be at high risk for conventional surgical aortic valve replacement. The surgeon indicated the patient would be a poor candidate for conventional surgery. Based upon review of all of the patient's preoperative diagnostic tests they are felt to be candidate for transcatheter aortic valve replacement using the transfemoral approach as an alternative to high risk conventional surgery.    Following the decision to proceed with transcatheter aortic valve replacement, a discussion has been held regarding what types of management strategies would be attempted intraoperatively in the event of life-threatening complications, including whether or not the patient would be considered a candidate for the use of cardiopulmonary bypass and/or conversion to open sternotomy for attempted surgical intervention.  The patient has been advised of a variety of complications that might develop peculiar to this approach including but not limited  to risks of death, stroke, paravalvular leak, aortic dissection or  other major vascular complications, aortic annulus rupture, device embolization, cardiac rupture or perforation, acute myocardial infarction, arrhythmia, heart block or bradycardia requiring permanent pacemaker placement, congestive heart failure, respiratory failure, renal failure, pneumonia, infection, other late complications related to structural valve deterioration or migration, or other complications that might ultimately cause a temporary or permanent loss of functional independence or other long term morbidity.  The patient provides full informed consent for the procedure as described and all questions were answered preoperatively.    DETAILS OF THE OPERATIVE PROCEDURE  PREPARATION:   The patient is brought to the operating room on the above mentioned date and central monitoring was established by the anesthesia team including placement of a radial arterial line. The patient is placed in the supine position on the operating table.  Intravenous antibiotics are administered. Conscious sedation is used.   Baseline transesophageal echocardiogram was performed. The patient's chest, abdomen, both groins, and both lower extremities are prepared and draped in a sterile manner. A time out procedure is performed.   PERIPHERAL ACCESS:   Using the modified Seldinger technique, femoral arterial and venous access were obtained with placement of 6 Fr sheaths on the right side using u/s guidance.  A pigtail diagnostic catheter was passed through the femoral arterial sheath under fluoroscopic guidance into the aortic root.  A temporary transvenous pacemaker catheter was passed through the femoral venous sheath under fluoroscopic guidance into the right ventricle.  The pacemaker was tested to ensure stable lead placement and pacemaker capture. Aortic root angiography was performed in order to determine the optimal angiographic angle for valve deployment.  TRANSAXILLARY ACCESS:  Please see Dr. Vivi Martens  operative note for subclavian artery cutdown procedure.   The patient was heparinized systemically and ACT verified > 250 seconds.  Once the subclavian artery was exposed, a needle was used to gain access.  A J wire was advanced into the aortic root. A 8 French sheath was placed. We then used a AL-1 catheter to cross the aortic valve with a straight wire. A long J wire was then advanced through the AL-1 catheter and a pigtail catheter was then advanced into the LV. An Amplatz Extra stiff wire was then advanced into the LV. The 14 French Edwards sheath was then advanced into the ascending aorta.   TRANSCATHETER HEART VALVE DEPLOYMENT:  An Edwards Sapien 3 THV (size 26 mm) was prepared and crimped per manufacturer's guidelines, and the proper orientation of the valve is confirmed on the [employer] delivery system. The valve was advanced through the introducer sheath using normal technique until in an appropriate position in the ascending aorta beyond the sheath tip. The balloon was then retracted and using the fine-tuning wheel was centered on the valve. The valve was then advanced across the aortic valve using appropriate flexion of the catheter. The valve was carefully positioned across the aortic valve annulus. The Commander catheter was retracted using normal technique. Once final position of the valve has been confirmed by angiographic assessment, the valve is deployed while temporarily holding ventilation and during rapid ventricular pacing to maintain systolic blood pressure < 50 mmHg and pulse pressure < 10 mmHg. The balloon inflation is held for >3 seconds after reaching full deployment volume. Once the balloon has fully deflated the balloon is retracted into the ascending aorta and valve function is assessed using TEE. There is felt to be trivial paravalvular leak and no central aortic insufficiency.  The  patient's hemodynamic recovery following valve deployment is good.  The deployment balloon  and guidewire are both removed. Echo demostrated acceptable post-procedural gradients, stable mitral valve function, and trivial AI.   PROCEDURE COMPLETION:  The sheath was then removed and the surgical wound was closed by Dr. Cyndia Bent. Please see his note for details. Protamine was administered once femoral arterial repair was complete. The temporary pacemaker was left in place. The pigtail catheter and femoral sheath were removed with Mynx closure device placed in the artery.    The patient tolerated the procedure well and is transported to the surgical intensive care in stable condition. There were no immediate intraoperative complications. All sponge instrument and needle counts are verified correct at completion of the operation.   No blood products were administered during the operation.  The patient received a total of 40.3 mL of intravenous contrast during the procedure.  Lauree Chandler MD 11/10/2019 10:37 AM

## 2019-11-10 NOTE — Progress Notes (Signed)
Pt arrived to 4e from cath lab. Pt sleepy, but responsive. Vitals obtained. MEWs continues to be yellow due to HR being tachy. Telemetry box applied and CCMD notified x2 verifiers. Pt has right femoral sheath with NS running at 10 cc/hr. Pacing wire attached to pacer box that is turned off. Site level 0. Left subclavian incision clean and dry. Right IJ has NS infusing, per order. Pt with bed in flat position and call light within reach.

## 2019-11-10 NOTE — Progress Notes (Signed)
Mobility Specialist - Progress Note   11/10/19 1642  Mobility  Activity Contraindicated/medical hold   Instructed by RN not to see pt.  Pricilla Handler Mobility Specialist Mobility Specialist Phone: [phone]

## 2019-11-10 NOTE — Progress Notes (Addendum)
  [facility] VALVE TEAM  Patient doing well s/p TAVR. He is hemodynamically stable but BP soft. Groin/subclavian sites stable, temp wire still in place. ECG with no high grade block, but pt is tachycardic. Looks like accelerated junctional rhythm vs atrial flutter. Will start him on a low dose Lopressor and watch pressure. Will keep temp wire in for now. Plan for early ambulation after bedrest completed and hopeful discharge over the next 24-48 hours.   Angelena Form PA-C  [facility]  Pager [phone]

## 2019-11-10 NOTE — Progress Notes (Signed)
0.9NS infusing at 10cc/hr to rt fv sheath; temporary pacing line connected to pacer box, off position.

## 2019-11-10 NOTE — Progress Notes (Signed)
Patient ID: [name] [name], male   DOB: 24-Aug-1926, 86 y.o.   MRN: [id number] TCTS Evening Rounds:  Hemodynamically stable.  Rhythm looks like it may be atrial flutter 115-120. Bifascicular block. Will continue to observe overnight.  Awake and alert Groin sites ok Left subclavian incision ok

## 2019-11-10 NOTE — Progress Notes (Signed)
Patient arrived to short stay with complaints of chest discomfort x 2 days.  Patient stated it occurs in the mornings when he awakes.  Patient took a nitroglycerin tablet yesterday morning and the discomfort went away.  Dr. Ermalene Postin notified.  Received order for EKG.    Dr. Marcie Bal, Dr. Angelena Form and Dr. Cyndia Bent made aware.

## 2019-11-10 NOTE — Anesthesia Procedure Notes (Addendum)
Procedure Name: Intubation Date/Time: 11/10/2019 7:53 AM Performed by: Barrington Ellison, CRNA Pre-anesthesia Checklist: Patient identified, Emergency Drugs available, Suction available and Patient being monitored Patient Re-evaluated:Patient Re-evaluated prior to induction Oxygen Delivery Method: Circle System Utilized Preoxygenation: Pre-oxygenation with 100% oxygen Induction Type: IV induction Ventilation: Mask ventilation without difficulty and Oral airway inserted - appropriate to patient size Laryngoscope Size: Mac and 4 Grade View: Grade I Tube type: Oral Tube size: 7.5 mm Number of attempts: 1 Airway Equipment and Method: Stylet and Oral airway Placement Confirmation: ETT inserted through vocal cords under direct vision,  positive ETCO2 and breath sounds checked- equal and bilateral Secured at: 23 cm Tube secured with: Tape Dental Injury: Teeth and Oropharynx as per pre-operative assessment

## 2019-11-10 NOTE — Progress Notes (Addendum)
  [facility]   MULTIDISCIPLINARY HEART VALVE TEAM  Pt remains in probable atrial flutter. Lopressor did not significantly affect rate. Mostly has discomfort related to bedrest from temp wire. He has not had any evidence of HAVB or pauses. Will pull temp wire. Also has had some recurrent chest pain, ECG with no acute ST changes. Will try SL nitro.   Angelena Form PA-C  [facility]

## 2019-11-10 NOTE — Telephone Encounter (Signed)
-----   Message from Eileen Stanford, PA-C sent at 11/10/2019 11:01 AM EST ----- Regarding: 1 month f/u and echo s/p TAVR Hey!   He underwent successful TAVR via the subclavian approach today. I will see him for his 1 week follow up. Do you want to see him for 1 month with echo? If so, we will need to move your apt on 11/23 out and add an echo. Window for visits are 12/2-1/18. Thanks !   KT

## 2019-11-10 NOTE — Transfer of Care (Signed)
Immediate Anesthesia Transfer of Care Note  Patient: [name] [name]  Procedure(s) Performed: TRANSCATHETER AORTIC VALVE REPLACEMENT, LEFT SUBCLAVIAN USING EDWARDS SAPIEN 3 ULTRA 26 MM THV AORTIC VALVE. (Left Chest) TRANSESOPHAGEAL ECHOCARDIOGRAM (TEE) (N/A )  Patient Location: Cath Lab  Anesthesia Type:General  Level of Consciousness: awake and oriented  Airway & Oxygen Therapy: Patient Spontanous Breathing and Patient connected to face mask oxygen  Post-op Assessment: Report given to RN  Post vital signs: Reviewed and stable  Last Vitals:  Vitals Value Taken Time  BP 119/79 11/10/19 1059  Temp 36.1 C 11/10/19 1051  Pulse 115 11/10/19 1101  Resp 14 11/10/19 1101  SpO2 99 % 11/10/19 1101  Vitals shown include unvalidated device data.  Last Pain:  Vitals:   11/10/19 1051  TempSrc: Temporal  PainSc: Asleep         Complications: No complications documented.

## 2019-11-10 NOTE — Anesthesia Procedure Notes (Signed)
Arterial Line Insertion Start/End11/09/2019 7:05 AM, 11/10/2019 7:10 AM Performed by: Barrington Ellison, CRNA  Patient location: Pre-op. Preanesthetic checklist: patient identified, risks and benefits discussed and pre-op evaluation Lidocaine 1% used for infiltration and patient sedated Right, radial was placed Catheter size: 20 G Hand hygiene performed  and maximum sterile barriers used  Allen's test indicative of satisfactory collateral circulation Attempts: 1 Procedure performed without using ultrasound guided technique. Following insertion, dressing applied and Biopatch. Post procedure assessment: normal  Patient tolerated the procedure well with no immediate complications.

## 2019-11-10 NOTE — Progress Notes (Signed)
Pt complaining of 6/10 chest pain. Pt states he feels like there's "pressure" in his chest. EKG done. Katie PA notified. Received order for sublingual nitro.

## 2019-11-10 NOTE — Anesthesia Procedure Notes (Signed)
Central Venous Catheter Insertion Performed by: Albertha Ghee, MD, anesthesiologist Start/End11/09/2019 7:01 AM, 11/10/2019 7:13 AM Patient location: Pre-op. Preanesthetic checklist: patient identified, IV checked, site marked, risks and benefits discussed, surgical consent, monitors and equipment checked, pre-op evaluation, timeout performed and anesthesia consent Position: Trendelenburg Lidocaine 1% used for infiltration and patient sedated Hand hygiene performed , maximum sterile barriers used  and Seldinger technique used Catheter size: 7 Fr Central line was placed.Double lumen Procedure performed using ultrasound guided technique. Ultrasound Notes:anatomy identified, needle tip was noted to be adjacent to the nerve/plexus identified, no ultrasound evidence of intravascular and/or intraneural injection and image(s) printed for medical record Attempts: 1 Following insertion, line sutured, dressing applied and Biopatch. Post procedure assessment: blood return through all ports, free fluid flow and no air  Patient tolerated the procedure well with no immediate complications.

## 2019-11-10 NOTE — Progress Notes (Signed)
Right femoral sheath removed, per order. Pressure held x10 minutes. Gauze and tegaderm dressing applied. Site level 0.

## 2019-11-10 NOTE — Interval H&P Note (Signed)
History and Physical Interval Note:  11/10/2019 7:05 AM  [name] [name]  has presented today for surgery, with the diagnosis of Severe Aortic Stenosis.  The various methods of treatment have been discussed with the patient and family. After consideration of risks, benefits and other options for treatment, the patient has consented to  Procedure(s): TRANSCATHETER AORTIC VALVE REPLACEMENT, LEFT SUBCLAVIAN (Left) TRANSESOPHAGEAL ECHOCARDIOGRAM (TEE) (N/A) as a surgical intervention.  The patient's history has been reviewed, patient examined, no change in status, stable for surgery.  I have reviewed the patient's chart and labs.  Questions were answered to the patient's satisfaction.     Gaye Pollack

## 2019-11-10 NOTE — Progress Notes (Signed)
  Echocardiogram Echocardiogram Transesophageal has been performed.  [name] [name] 11/10/2019, 10:18 AM

## 2019-11-11 ENCOUNTER — Inpatient Hospital Stay (HOSPITAL_COMMUNITY): Payer: Medicare Other

## 2019-11-11 ENCOUNTER — Encounter (HOSPITAL_COMMUNITY): Payer: Self-pay | Admitting: Surgery

## 2019-11-11 ENCOUNTER — Ambulatory Visit (INDEPENDENT_AMBULATORY_CARE_PROVIDER_SITE_OTHER): Payer: Medicare Other

## 2019-11-11 DIAGNOSIS — I452 Bifascicular block: Secondary | ICD-10-CM | POA: Diagnosis not present

## 2019-11-11 DIAGNOSIS — Z952 Presence of prosthetic heart valve: Secondary | ICD-10-CM

## 2019-11-11 DIAGNOSIS — I480 Paroxysmal atrial fibrillation: Secondary | ICD-10-CM

## 2019-11-11 DIAGNOSIS — Z954 Presence of other heart-valve replacement: Secondary | ICD-10-CM

## 2019-11-11 DIAGNOSIS — I350 Nonrheumatic aortic (valve) stenosis: Secondary | ICD-10-CM

## 2019-11-11 DIAGNOSIS — Z006 Encounter for examination for normal comparison and control in clinical research program: Secondary | ICD-10-CM | POA: Diagnosis not present

## 2019-11-11 DIAGNOSIS — I5033 Acute on chronic diastolic (congestive) heart failure: Secondary | ICD-10-CM | POA: Diagnosis not present

## 2019-11-11 LAB — CBC
HCT: 30.3 % — ABNORMAL LOW (ref 39.0–52.0)
Hemoglobin: 9.6 g/dL — ABNORMAL LOW (ref 13.0–17.0)
MCH: 30.9 pg (ref 26.0–34.0)
MCHC: 31.7 g/dL (ref 30.0–36.0)
MCV: 97.4 fL (ref 80.0–100.0)
Platelets: 128 10*3/uL — ABNORMAL LOW (ref 150–400)
RBC: 3.11 MIL/uL — ABNORMAL LOW (ref 4.22–5.81)
RDW: 14.2 % (ref 11.5–15.5)
WBC: 7 10*3/uL (ref 4.0–10.5)
nRBC: 0 % (ref 0.0–0.2)

## 2019-11-11 LAB — BASIC METABOLIC PANEL
Anion gap: 8 (ref 5–15)
BUN: 25 mg/dL — ABNORMAL HIGH (ref 8–23)
CO2: 24 mmol/L (ref 22–32)
Calcium: 9.5 mg/dL (ref 8.9–10.3)
Chloride: 106 mmol/L (ref 98–111)
Creatinine, Ser: 1.26 mg/dL — ABNORMAL HIGH (ref 0.61–1.24)
GFR, Estimated: 54 mL/min — ABNORMAL LOW (ref >60)
Glucose, Bld: 125 mg/dL — ABNORMAL HIGH (ref 70–99)
Potassium: 4.4 mmol/L (ref 3.5–5.1)
Sodium: 138 mmol/L (ref 135–145)

## 2019-11-11 LAB — ECHOCARDIOGRAM COMPLETE
AR max vel: 1.46 cm2
AV Area VTI: 1.72 cm2
AV Area mean vel: 1.57 cm2
AV Mean grad: 8.5 mmHg
AV Peak grad: 16.1 mmHg
Ao pk vel: 2.01 m/s
Height: 73 in
S' Lateral: 4 cm
Weight: 3947.12 oz

## 2019-11-11 LAB — MAGNESIUM: Magnesium: 2.2 mg/dL (ref 1.7–2.4)

## 2019-11-11 MED ORDER — FUROSEMIDE 40 MG PO TABS
40.0000 mg | ORAL_TABLET | Freq: Every day | ORAL | Status: DC
  Administered 2019-11-11: 40 mg via ORAL
  Filled 2019-11-11: qty 1

## 2019-11-11 MED ORDER — POTASSIUM CHLORIDE CRYS ER 20 MEQ PO TBCR
20.0000 meq | EXTENDED_RELEASE_TABLET | Freq: Every day | ORAL | Status: DC
  Administered 2019-11-11: 20 meq via ORAL
  Filled 2019-11-11: qty 1

## 2019-11-11 MED FILL — Heparin Sodium (Porcine) Inj 1000 Unit/ML: INTRAMUSCULAR | Qty: 30 | Status: AC

## 2019-11-11 MED FILL — Potassium Chloride Inj 2 mEq/ML: INTRAVENOUS | Qty: 40 | Status: AC

## 2019-11-11 MED FILL — Magnesium Sulfate Inj 50%: INTRAMUSCULAR | Qty: 10 | Status: AC

## 2019-11-11 NOTE — Anesthesia Postprocedure Evaluation (Signed)
Anesthesia Post Note  Patient: [name]  Procedure(s) Performed: TRANSCATHETER AORTIC VALVE REPLACEMENT, LEFT SUBCLAVIAN USING EDWARDS SAPIEN 3 ULTRA 26 MM THV AORTIC VALVE. (Left Chest) TRANSESOPHAGEAL ECHOCARDIOGRAM (TEE) (N/A )     Patient location during evaluation: PACU Anesthesia Type: General Level of consciousness: awake and alert Pain management: pain level controlled Vital Signs Assessment: post-procedure vital signs reviewed and stable Respiratory status: spontaneous breathing, nonlabored ventilation, respiratory function stable and patient connected to nasal cannula oxygen Cardiovascular status: blood pressure returned to baseline and stable Postop Assessment: no apparent nausea or vomiting Anesthetic complications: no   No complications documented.  Last Vitals:  Vitals:   11/11/19 0403 11/11/19 0951  BP: 125/79 (!) 147/83  Pulse: 73 70  Resp: 17 18  Temp: 36.8 C 36.4 C  SpO2: 96% 98%    Last Pain:  Vitals:   11/11/19 0951  TempSrc: Oral  PainSc: 0-No pain                 Beatrice Sehgal S

## 2019-11-11 NOTE — Discharge Summary (Addendum)
[facility] TEAM  Discharge Summary    Patient ID: [name] [name] MRN: [id number]; DOB: 12-27-1926  Admit date: 11/10/2019 Discharge date: 11/11/2019  Primary Care Provider: [name], MD  Primary Cardiologist: Dr. Einar Gip / Dr. Angelena Form & Dr. Cyndia Bent (TAVR)  Discharge Diagnoses    Principal Problem:   S/P TAVR (transcatheter aortic valve replacement) Active Problems:   CAD (coronary artery disease), native coronary artery   Hyperlipidemia   Obesity (BMI 30-39.9)   Hypertension   Chronic venous insufficiency   Gout   Benign prostatic hyperplasia   Paroxysmal atrial fibrillation (HCC)   S/P CABG x 3   Thoracic ascending aortic aneurysm (HCC)   OSA (obstructive sleep apnea)   Severe aortic stenosis   Acute on chronic diastolic heart failure (HCC)   Bifascicular block   Allergies Allergies  Allergen Reactions  . Amoxicillin Other (See Comments)    Headache, debilitated  . Bee Venom Anaphylaxis  . Morphine And Related Other (See Comments)    hypotension    Diagnostic Studies/Procedures    TAVR OPERATIVE NOTE   Date of Procedure:                11/10/2019  Preoperative Diagnosis:      Severe Aortic Stenosis   Postoperative Diagnosis:    Same   Procedure:        Transcatheter Aortic Valve Replacement - Left subclavian artery approach             Edwards Sapien 3 Ultra THV (size 26 mm, model # 9750TFX, serial # [id number])              Co-Surgeons:                        Gaye Pollack, MD and Lauree Chandler, MD   Anesthesiologist:                  Renaldo Reel, MD  Echocardiographer:              Bertrum Sol, MD  Pre-operative Echo Findings: ? Severe aortic stenosis ? Normal left ventricular systolic function  Post-operative Echo Findings: ? Trivial paravalvular leak ? Normal left ventricular systolic function  _____________   Echo 11/11/19: complete but pending formal read at the time of  discharge    History of Present Illness     [name] [name] is a 86 y.o. male with a history of PAF on Xarelto, mild dementia, CAD s/p CABG and Maze/LAA Clip (2014-CHO), TAA noted in surgery followed by Dr. Roxy Manns, anemia, polymyalgia rheumatica, OSA, chronic venous stasis, chronic combined S/D CHF, CKD stage IV, HTN, HLD, obesity, PAD, and severe AS who presented to [facility] on 11/10/19 for planned TAVR.  The patient underwent coronary artery bypass grafting 3 and Maze procedure on 03/06/2012 for severe three-vessel coronary artery disease and recurrent paroxysmal atrial fibrillation.  At the time of his surgery his aortic valve was notably trileaflet with no aortic stenosis but mild leaflet sclerosis and mild aortic insufficiency.  He was also noted to have mild to moderate fusiform dilatation of the ascending thoracic aorta which we have followed intermittently ever since. He was last seen in [place] office in 2019 at which time the diameter of his ascending aorta remained stable.  The patient has been followed carefully for several years by Dr. Einar Gip, and follow-up echocardiograms have demonstrated the development of aortic stenosis that has slowly  progressed in severity. Most recent echocardiogram performed September 24, 2019 revealed low normal left ventricular systolic function with ejection fraction estimated 50 to 55%.  The aortic valve appeared trileaflet with severe aortic stenosis.  Peak and mean transvalvular gradients were reported 40 and 27 mmHg respectively, corresponding to aortic valve area calculated 0.97 cm.  There was mild mitral regurgitation and mild to moderate tricuspid regurgitation. The patient was recently seen in follow-up by Dr. Einar Gip and complained of worsening symptoms of exertional shortness of breath and fatigue.  Diagnostic cardiac catheterization was performed September 29, 2019 showed stable CAD/bypass grafts and confirmed the presence of aortic stenosis with peak to peak and mean  transvalvular gradients measured 41 and 33 mmHg respectively, corresponding to aortic valve area 1.09 cm.  Right heart pressures were very mildly elevated.  The patient has been evaluated by the multidisciplinary valve team and felt to have severe, symptomatic aortic stenosis and to be a suitable candidate for TAVR, which was set up for 10/27/19. However, due to insurance issues with delay in approval, this was pushed out to 11/10/19.   Hospital Course     Consultants: none  Severe AS: s/p successful TAVR with a 26 mm Edwards Sapien 3 Ultra THV via the subclavian approach on 11/10/19. Post operative echo completed at discharge but pending formal read. Groin/subclavian sites are stable. ECG/tele with no high grade heart block. Continue on home Xarelto alone given advanced age and increased risk of bleeding. Plan for discharge home today with close follow up in the office next week.   Atrial flutter with RVR: pt arrived to the hospital prior to surgery with elevated heart rates in what looked to be atrial flutter. He has now converted back to baseline rhythm. Will resume home Xarelto tonight.   Bifascicular block: pt has baseline RBBB and LAFB. Now s/p TAVR. Temp wire was left in for several hours after procedure. ECG this AM with sinus with bifascicular block. Will place a Zio patch given high risk for HAVB after TAVR with baseline conduction abnormalities.    Acute on chronic diastolic CHF: as evidenced by an elevated BNP on pre admission lab work. This has been treated with TAVR. He will be restarted on home lasix.   HTN: BP soft post procedure. Now improved to 125/79. Will resume home meds.   CAD s/p CABG: he did have some chest pain prior to TAVR and post procedure. This resolved with SL NTG . Will resume home imdur. No aspirin given Xarelto use. Continue statin.  Ascending thoracic aortic aneurysm: this has been stable over time.  _____________  Discharge Vitals Blood pressure 125/79,  pulse 73, temperature 98.3 F (36.8 C), temperature source Oral, resp. rate 17, height 6\' 1"  (1.854 m), weight 111.9 kg, SpO2 96 %.  Filed Weights   11/10/19 0602 11/11/19 0620  Weight: 108.9 kg 111.9 kg    Labs & Radiologic Studies    CBC Recent Labs    11/10/19 1108 11/11/19 0416  WBC  --  7.0  HGB 10.5* 9.6*  HCT 31.0* 30.3*  MCV  --  97.4  PLT  --  762*   Basic Metabolic Panel Recent Labs    11/10/19 1108 11/11/19 0416  NA 140 138  K 3.8 4.4  CL 106 106  CO2  --  24  GLUCOSE 148* 125*  BUN 25* 25*  CREATININE 1.20 1.26*  CALCIUM  --  9.5  MG  --  2.2   Liver Function Tests No results  for input(s): AST, ALT, ALKPHOS, BILITOT, PROT, ALBUMIN in the last 72 hours. No results for input(s): LIPASE, AMYLASE in the last 72 hours. Cardiac Enzymes No results for input(s): CKTOTAL, CKMB, CKMBINDEX, TROPONINI in the last 72 hours. BNP Invalid input(s): POCBNP D-Dimer No results for input(s): DDIMER in the last 72 hours. Hemoglobin A1C No results for input(s): HGBA1C in the last 72 hours. Fasting Lipid Panel No results for input(s): CHOL, HDL, LDLCALC, TRIG, CHOLHDL, LDLDIRECT in the last 72 hours. Thyroid Function Tests No results for input(s): TSH, T4TOTAL, T3FREE, THYROIDAB in the last 72 hours.  Invalid input(s): FREET3 _____________  DG Chest 2 View  Result Date: 11/08/2019 CLINICAL DATA:  Severe aortic stenosis. EXAM: CHEST - 2 VIEW COMPARISON:  Chest x-ray 10/22/2019 FINDINGS: Atrial appendage clip. Cardiac surgical changes. Sternotomy wires appear intact. The heart size and mediastinal contours are within normal limits. Aortic arch calcifications. No focal consolidation. No pulmonary edema. No pleural effusion. No pneumothorax. No acute osseous abnormality. Multilevel degenerative changes of the spine. IMPRESSION: No active cardiopulmonary disease. Electronically Signed   By: Iven Finn M.D.   On: 11/08/2019 14:22   DG Chest 2 View  Result Date:  10/23/2019 CLINICAL DATA:  Pre trans catheter aortic valve replacement. EXAM: CHEST - 2 VIEW COMPARISON:  09/11/2018 FINDINGS: Stable borderline enlarged cardiac silhouette, post CABG changes and left atrial clip. Clear lungs with normal vascularity. Thoracic spine degenerative changes, including changes of DISH IMPRESSION: No acute abnormality. Electronically Signed   By: Claudie Revering M.D.   On: 10/23/2019 20:21   CT CORONARY MORPH W/CTA COR W/SCORE W/CA W/CM &/OR WO/CM  Addendum Date: 10/13/2019   ADDENDUM REPORT: 10/13/2019 12:57 CLINICAL DATA:  Aortic stenosis EXAM: Cardiac TAVR CT TECHNIQUE: The patient was scanned on a Siemens Force 308 slice scanner. A 120 kV retrospective scan was triggered in the descending thoracic aorta at 111 HU's. Gantry rotation speed was 270 msecs and collimation was .9 mm. No beta blockade or nitro were given. The 3D data set was reconstructed in 5% intervals of the R-R cycle. Systolic and diastolic phases were analyzed on a dedicated work station using MPR, MIP and VRT modes. The patient received 80 cc of contrast. FINDINGS: Aortic Valve: Aorta: Moderate dilatation of the ascending thoracic aorta, normal arch vessels with moderate calcific atherosclerosis Sinotubular Junction: 31 mm Ascending Thoracic Aorta: Moderately dilated 44 mm Aortic Arch: 25 mm Descending Thoracic Aorta: 24 mm Sinus of Valsalva Measurements: Non-coronary: 37 mm Right - coronary: 35.3 mm Left - coronary: 35.3 mm Coronary Artery Height above Annulus: Left Main: 12.6 mm above annulus Right Coronary: 17.8 mm above annulus Virtual Basal Annulus Measurements: Maximum/Minimum Diameter: 29.7 mm x 23.1 mm Perimeter: 84 mm Area: 540.6 mm 2 Coronary Arteries: Sufficient height above annulus for deployment Patent SVG to RCA, Patent SVG to OM1, and patent LIMA to LAD Optimum Fluoroscopic Angle for Delivery: LAO 16 Caudal 23 degrees IMPRESSION: 1. Tri- leaflet AV with annular area 540 mm2 upper limit for 26 mm Sapien  3 valve Despite large sinuses commissural calcium may suggest smaller valve better than 29 mm valve 2. Optimum angiographic angle for deployment LAO 16 Caudal 23 degrees 3.  Moderate aortic root dilatation 4.4 cm 4. Coronary arteries sufficient height above annulus for deployment with patent SVG to RCA/OM1 and patent LIMA to LAD 5.  LAA has been clipped with no contrast flow into the appendage [place] Electronically Signed   By: Jenkins Rouge M.D.   On: 10/13/2019 12:57  Result Date: 10/13/2019 EXAM: OVER-READ INTERPRETATION  CT CHEST The following report is an over-read performed by radiologist Dr. Vinnie Langton of [facility], [place] on 10/13/2019. This over-read does not include interpretation of cardiac or coronary anatomy or pathology. The coronary calcium score/coronary CTA interpretation by the cardiologist is attached. COMPARISON:  Chest CTA 04/29/2017. FINDINGS: Extracardiac findings will be described separately under dictation for contemporaneously obtained CTA chest, abdomen and pelvis. IMPRESSION: Please see separate dictation for contemporaneously obtained CTA chest, abdomen and pelvis dated 10/13/2019 for full description of relevant extracardiac findings. Electronically Signed: By: Vinnie Langton M.D. On: 10/13/2019 12:17   DG Chest Port 1 View  Result Date: 11/10/2019 CLINICAL DATA:  Post TAVR EXAM: PORTABLE CHEST 1 VIEW COMPARISON:  Portable exam 1237 hours compared to 11/06/2019 FINDINGS: RIGHT jugular line tip projecting over SVC. Upper normal heart size post CABG, TAVR, and Maze procedure. Atherosclerotic calcification aorta. Subsegmental atelectasis LEFT upper lobe and minimally LEFT base. Remaining lungs clear. No pleural effusion or pneumothorax. Bones demineralized. IMPRESSION: Scattered atelectasis LEFT lung. Electronically Signed   By: Lavonia Dana M.D.   On: 11/10/2019 13:00   CT ANGIO CHEST AORTA W/CM & OR WO/CM  Result Date: 10/13/2019 CLINICAL DATA:   86 year old male with history of severe aortic stenosis under preprocedural evaluation prior to potential transcatheter aortic valve replacement (TAVR) procedure. EXAM: CT ANGIOGRAPHY CHEST, ABDOMEN AND PELVIS TECHNIQUE: Multidetector CT imaging through the chest, abdomen and pelvis was performed using the standard protocol during bolus administration of intravenous contrast. Multiplanar reconstructed images and MIPs were obtained and reviewed to evaluate the vascular anatomy. CONTRAST:  32mL OMNIPAQUE IOHEXOL 350 MG/ML SOLN COMPARISON:  CTA chest 04/29/2017. FINDINGS: CTA CHEST FINDINGS Cardiovascular: Heart size is borderline enlarged. There is no significant pericardial fluid, thickening or pericardial calcification. There is aortic atherosclerosis, as well as atherosclerosis of the great vessels of the mediastinum and the coronary arteries, including calcified atherosclerotic plaque in the left main, left anterior descending, left circumflex and right coronary arteries. Status post median sternotomy for CABG including LIMA to the LAD. Thickening calcification of the aortic valve. Mediastinum/Lymph Nodes: No pathologically enlarged mediastinal or hilar lymph nodes. Please note that accurate exclusion of hilar adenopathy is limited on noncontrast CT scans. Esophagus is unremarkable in appearance. No axillary lymphadenopathy. Lungs/Pleura: No suspicious appearing pulmonary nodules or masses are noted. No acute consolidative airspace disease. No pleural effusions. Musculoskeletal/Soft Tissues: Median sternotomy wires. There are no aggressive appearing lytic or blastic lesions noted in the visualized portions of the skeleton. CTA ABDOMEN AND PELVIS FINDINGS Hepatobiliary: No suspicious cystic or solid hepatic lesions. No intra or extrahepatic biliary ductal dilatation. Status post cholecystectomy. Pancreas: No pancreatic mass. No pancreatic ductal dilatation. No pancreatic or peripancreatic fluid collections or  inflammatory changes. Spleen: Unremarkable. Adrenals/Urinary Tract: Low-attenuation lesions in the kidneys bilaterally, compatible with simple cysts, largest of which is in the upper pole of the right kidney measuring 3 cm in diameter. Mild bilateral renal atrophy with mild diffuse cortical thinning. No suspicious renal lesions. Bilateral adrenal glands are normal in appearance. No hydroureteronephrosis. Urinary bladder is normal in appearance. Stomach/Bowel: Normal appearance of the stomach. No pathologic dilatation of small bowel or colon. Numerous colonic diverticulae are noted, without surrounding inflammatory changes to suggest an acute diverticulitis at this time. Normal appendix. Vascular/Lymphatic: Aortic atherosclerosis, with vascular findings and measurements pertinent to potential TAVR procedure, as detailed below. In addition, there is multifocal aneurysmal dilatation and short segment dissections of both the abdominal aorta and the  common iliac arteries bilaterally. Multifocal fusiform aneurysmal dilatation of the infrarenal abdominal aorta measuring up to 3.8 x 3.1 cm proximally and 4.1 x 3.0 cm more distally, with multiple short segment dissection flaps within these regions. Aneurysmal dilatation of the common iliac arteries measuring 1.9 cm in diameter on the right and 1.6 cm in diameter on the left. No lymphadenopathy noted in the abdomen or pelvis. Reproductive: Prostate gland and seminal vesicles are unremarkable in appearance. Other: No significant volume of ascites.  No pneumoperitoneum. Musculoskeletal: There are no aggressive appearing lytic or blastic lesions noted in the visualized portions of the skeleton. VASCULAR MEASUREMENTS PERTINENT TO TAVR: AORTA: Minimal Aortic Diameter-15 x 11 mm Severity of Aortic Calcification-severe RIGHT PELVIS: Right Common Iliac Artery - Minimal Diameter-7.9 x 7.1 mm Tortuosity-mild Calcification-moderate to severe Comment-aneurysmally dilated with short  segment dissection Right External Iliac Artery - Minimal Diameter-9.6 x 8.4 mm Tortuosity-severe Calcification-moderate to severe Right Common Femoral Artery - Minimal Diameter-7.5 x 7.5 mm Tortuosity-mild Calcification-moderate to severe LEFT PELVIS: Left Common Iliac Artery - Minimal Diameter-7.9 x 6.5 mm Tortuosity-mild Calcification-moderate to severe Comment-aneurysmally dilated with short segment dissection Left External Iliac Artery - Minimal Diameter-9.6 x 7.6 mm Tortuosity-moderate Calcification-moderate to severe Left Common Femoral Artery - Minimal Diameter-8.7 x 6.1 mm Tortuosity-mild Calcification-moderate to severe Review of the MIP images confirms the above findings. IMPRESSION: 1. Vascular findings and measurements pertinent to potential TAVR procedure, as detailed above. Please note the presence of multiple aneurysms and short-segment dissections involving the infrarenal abdominal aorta as well as the common iliac arteries bilaterally. 2. Severe thickening calcification of the aortic valve, compatible with the reported clinical history of severe aortic stenosis. 3. Aortic atherosclerosis, in addition to left main and 3 vessel coronary artery disease. Status post median sternotomy for CABG including LIMA to the LAD. 4. Colonic diverticulosis without evidence of acute diverticulitis at this time. 5. Additional incidental findings, as above. Electronically Signed   By: Vinnie Langton M.D.   On: 10/13/2019 12:59   ECHOCARDIOGRAM COMPLETE  Result Date: 10/27/2019    ECHOCARDIOGRAM REPORT   Patient Name:   Tyshawn Shelbie Ammons Date of Exam: 10/27/2019 Medical Rec #:  [id number]     Height:       73.0 in Accession #:    [id number]    Weight:       247.1 lb Date of Birth:  06-25-1926      BSA:          2.354 m Patient Age:    36 years      BP:           135/60 mmHg Patient Gender: M             HR:           67 bpm. Exam Location:  [address] Procedure: 2D Echo, 3D Echo, Cardiac Doppler and Color Doppler  Indications:    I35.0 Aortic Stenosis  History:        Patient has prior history of Echocardiogram examinations, most                 recent 09/24/2019. CHF, CAD and Previous Myocardial Infarction,                 Prior CABG, Arrythmias:Atrial Fibrillation; Risk Factors:Former                 Smoker, Hypertension, Dyslipidemia and Sleep Apnea.  Pre-Operative Eval For TAVR and AAA Repair.  Sonographer:    Deliah Boston RDCS Referring Phys: [id number] [place]  1. Left ventricular ejection fraction, by estimation, is 60 to 65%. The left ventricle has normal function. The left ventricle has no regional wall motion abnormalities. Left ventricular diastolic function could not be evaluated.  2. Right ventricular systolic function is normal. The right ventricular size is normal. There is normal pulmonary artery systolic pressure.  3. Left atrial size was mildly dilated.  4. Right atrial size was moderately dilated.  5. The mitral valve is normal in structure. No evidence of mitral valve regurgitation.  6. The aortic valve is tricuspid. There is moderate calcification of the aortic valve. There is moderate thickening of the aortic valve. Aortic valve regurgitation is not visualized. Moderate to severe aortic valve stenosis.  7. Aortic dilatation noted. There is moderate to severe dilatation of the ascending aorta, measuring 45 mm. FINDINGS  Left Ventricle: Left ventricular ejection fraction, by estimation, is 60 to 65%. The left ventricle has normal function. The left ventricle has no regional wall motion abnormalities. The left ventricular internal cavity size was normal in size. There is  no left ventricular hypertrophy. Left ventricular diastolic function could not be evaluated due to atrial fibrillation. Left ventricular diastolic function could not be evaluated. Right Ventricle: The right ventricular size is normal. No increase in right ventricular wall thickness. Right ventricular  systolic function is normal. There is normal pulmonary artery systolic pressure. The tricuspid regurgitant velocity is 2.66 m/s, and  with an assumed right atrial pressure of 3 mmHg, the estimated right ventricular systolic pressure is 94.1 mmHg. Left Atrium: Left atrial size was mildly dilated. Right Atrium: Right atrial size was moderately dilated. Pericardium: There is no evidence of pericardial effusion. Mitral Valve: The mitral valve is normal in structure. No evidence of mitral valve regurgitation. Tricuspid Valve: The tricuspid valve is grossly normal. Tricuspid valve regurgitation is mild . No evidence of tricuspid stenosis. Aortic Valve: The aortic valve is tricuspid. There is moderate calcification of the aortic valve. There is moderate thickening of the aortic valve. There is moderate aortic valve annular calcification. Aortic valve regurgitation is not visualized. Aortic  regurgitation PHT measures 577 msec. Moderate to severe aortic stenosis is present. Aortic valve mean gradient measures 28.8 mmHg. Aortic valve peak gradient measures 52.1 mmHg. Aortic valve area, by VTI measures 1.37 cm. Pulmonic Valve: The pulmonic valve was normal in structure. Pulmonic valve regurgitation is not visualized. No evidence of pulmonic stenosis. Aorta: Aortic dilatation noted. There is moderate to severe dilatation of the ascending aorta, measuring 45 mm. IAS/Shunts: The atrial septum is grossly normal.  LEFT VENTRICLE PLAX 2D LVIDd:         4.60 cm  Diastology LVIDs:         2.20 cm  LV e' medial:    5.22 cm/s LV PW:         1.20 cm  LV E/e' medial:  23.9 LV IVS:        1.00 cm  LV e' lateral:   6.95 cm/s LVOT diam:     2.60 cm  LV E/e' lateral: 18.0 LV SV:         119 LV SV Index:   51 LVOT Area:     5.31 cm                          3D Volume EF:  3D EF:        56 %                         LV EDV:       214 ml                         LV ESV:       94 ml                         LV SV:        120  ml RIGHT VENTRICLE RV S prime:     11.40 cm/s TAPSE (M-mode): 1.3 cm LEFT ATRIUM             Index       RIGHT ATRIUM           Index LA diam:        3.70 cm 1.57 cm/m  RA Area:     23.20 cm LA Vol (A2C):   83.5 ml 35.47 ml/m RA Volume:   68.30 ml  29.01 ml/m LA Vol (A4C):   84.3 ml 35.81 ml/m LA Biplane Vol: 83.9 ml 35.64 ml/m  AORTIC VALVE AV Area (Vmax):    1.27 cm AV Area (Vmean):   1.33 cm AV Area (VTI):     1.37 cm AV Vmax:           361.00 cm/s AV Vmean:          249.000 cm/s AV VTI:            0.870 m AV Peak Grad:      52.1 mmHg AV Mean Grad:      28.8 mmHg LVOT Vmax:         86.20 cm/s LVOT Vmean:        62.250 cm/s LVOT VTI:          0.224 m LVOT/AV VTI ratio: 0.26 AI PHT:            577 msec  AORTA Ao Root diam: 3.70 cm Ao Asc diam:  4.60 cm MITRAL VALVE                TRICUSPID VALVE MV Area (PHT): cm          TR Peak grad:   28.3 mmHg MV Decel Time: 302 msec     TR Vmax:        266.00 cm/s MV E velocity: 125.00 cm/s MV A velocity: 50.40 cm/s   SHUNTS MV E/A ratio:  2.48         Systemic VTI:  0.22 m                             Systemic Diam: 2.60 cm Mertie Moores MD Electronically signed by Mertie Moores MD Signature Date/Time: 10/27/2019/2:34:23 PM    Final    ECHO TEE  Result Date: 11/10/2019    TRANSESOPHOGEAL ECHO REPORT   Patient Name:   Issaac Shelbie Ammons  Date of Exam: 11/10/2019 Medical Rec #:  [id number]      Height:       73.0 in Accession #:    [id number]     Weight:       240.0 lb Date of Birth:  February 29, 1926       BSA:  2.325 m Patient Age:    44 years       BP:           118/84 mmHg Patient Gender: M              HR:           123 bpm. Exam Location:  Anesthesiology Procedure: TEE-Intraopertive, Cardiac Doppler and Color Doppler Indications:     Aortic stenosis  History:         Patient has prior history of Echocardiogram examinations, most                  recent 10/27/2019. CAD and Previous Myocardial Infarction,                  Prior CABG, Aortic Valve Disease,  Arrythmias:Atrial                  Fibrillation; Risk Factors:Dyslipidemia, Diabetes and                  Hypertension. S/p MAZE procedure.                  Aortic Valve: 26 mm Sapien prosthetic, stented (TAVR) valve is                  present in the aortic position. Procedure Date: 11/10/2019.  Sonographer:     Dustin Flock Referring Phys:  [place] Phys: Sanda Klein MD PROCEDURE: After discussion of the risks and benefits of a TEE, an informed consent was obtained from the patient. The transesophogeal probe was passed without difficulty through the esophogus of the patient. Sedation performed by different physician. The patient was monitored while under deep sedation. Image quality was excellent. The patient developed no complications during the procedure. PRE-PROCEDURAL FINDINGS: Low normal left ventricular systolic function. Estimated LVEF 50-55%. There are no regional wall motion abnormalities. Severe calcific aortic stenosis. Trileaflet aortic valve. Peak aortic valve gradient 34 mm Hg, mean gradient 20 mm Hg. Gradients may be underestimated due to poor Doppler beam alignment. Dimensionless obstructive index 0.22, calculated aortic valve area is 0.9 cm. Mild aortic insufficiency. Mild-moderate mitral insufficiency. No pericardial effusion. POST-PROCEDURAL FINDINGS: Normal left ventricular systolic function. Estimated LVEF 60%. There are no regional wall motion abnormalities. Well-seated TAVR stent-valve. Peak aortic valve gradient 3 mm Hg, mean gradient 2 mm Hg. Dimensionless obstructive index 0.67, calculated aortic valve area is 2.78 cm. There is trivial perivalvular leak. Mild mitral insufficiency. No pericardial effusion. IMPRESSIONS  1. Left ventricular ejection fraction, by estimation, is 50 to 55%. The left ventricle has low normal function. There is moderate concentric left ventricular hypertrophy.  2. Right ventricular systolic function is normal. The right  ventricular size is normal. Mildly increased right ventricular wall thickness.  3. The left atrial appendage is surgically clipped. Left atrial size was mildly dilated. No left atrial/left atrial appendage thrombus was detected.  4. Right atrial size was mildly dilated.  5. The aortic valve is tricuspid. There is severe calcification of the aortic valve. There is severe thickening of the aortic valve. Aortic valve regurgitation is mild. Severe aortic valve stenosis. There is a 26 mm Sapien prosthetic (TAVR) valve present in the aortic position. Procedure Date: 11/10/2019.  6. Aortic dilatation noted. There is borderline dilatation of the ascending aorta, measuring 38 mm.  7. The mitral valve is normal in structure. Mild to moderate mitral valve regurgitation. FINDINGS  Left Ventricle: Left ventricular ejection  fraction, by estimation, is 50 to 55%. The left ventricle has low normal function. The left ventricular internal cavity size was small. There is moderate concentric left ventricular hypertrophy. Right Ventricle: The right ventricular size is normal. Mildly increased right ventricular wall thickness. Right ventricular systolic function is normal. Left Atrium: The left atrial appendage is surgically clipped. Left atrial size was mildly dilated. No left atrial/left atrial appendage thrombus was detected. Right Atrium: Right atrial size was mildly dilated. Pericardium: There is no evidence of pericardial effusion. Mitral Valve: The mitral valve is normal in structure. Mild to moderate mitral valve regurgitation, with centrally-directed jet. Tricuspid Valve: The tricuspid valve is normal in structure. Tricuspid valve regurgitation is mild. Aortic Valve: The aortic valve is tricuspid. There is severe calcifcation of the aortic valve. There is severe thickening of the aortic valve. Aortic valve regurgitation is mild. Severe aortic stenosis is present. Aortic valve mean gradient measures 10.0  mmHg. Aortic valve peak  gradient measures 16.9 mmHg. Aortic valve area, by VTI measures 1.45 cm. There is a 26 mm Sapien prosthetic, stented (TAVR) valve present in the aortic position. Procedure Date: 11/10/2019. Pulmonic Valve: The pulmonic valve was normal in structure. Pulmonic valve regurgitation is not visualized. Aorta: Aortic dilatation noted. There is borderline dilatation of the ascending aorta, measuring 38 mm. IAS/Shunts: No atrial level shunt detected by color flow Doppler.  LEFT VENTRICLE PLAX 2D LVOT diam:     2.27 cm LV SV:         46 LV SV Index:   20 LVOT Area:     4.04 cm  AORTIC VALVE AV Area (Vmax):    1.30 cm AV Area (Vmean):   1.35 cm AV Area (VTI):     1.45 cm AV Vmax:           205.48 cm/s AV Vmean:          129.000 cm/s AV VTI:            0.317 m AV Peak Grad:      16.9 mmHg AV Mean Grad:      10.0 mmHg LVOT Vmax:         66.20 cm/s LVOT Vmean:        43.033 cm/s LVOT VTI:          0.114 m LVOT/AV VTI ratio: 0.36  SHUNTS Systemic VTI:  0.11 m Systemic Diam: 2.27 cm Sanda Klein MD Electronically signed by Sanda Klein MD Signature Date/Time: 11/10/2019/2:26:12 PM    Final    Structural Heart Procedure  Result Date: 11/10/2019 See surgical note for result.  CT ANGIO ABDOMEN PELVIS  W &/OR WO CONTRAST  Result Date: 10/13/2019 CLINICAL DATA:  86 year old male with history of severe aortic stenosis under preprocedural evaluation prior to potential transcatheter aortic valve replacement (TAVR) procedure. EXAM: CT ANGIOGRAPHY CHEST, ABDOMEN AND PELVIS TECHNIQUE: Multidetector CT imaging through the chest, abdomen and pelvis was performed using the standard protocol during bolus administration of intravenous contrast. Multiplanar reconstructed images and MIPs were obtained and reviewed to evaluate the vascular anatomy. CONTRAST:  43mL OMNIPAQUE IOHEXOL 350 MG/ML SOLN COMPARISON:  CTA chest 04/29/2017. FINDINGS: CTA CHEST FINDINGS Cardiovascular: Heart size is borderline enlarged. There is no significant  pericardial fluid, thickening or pericardial calcification. There is aortic atherosclerosis, as well as atherosclerosis of the great vessels of the mediastinum and the coronary arteries, including calcified atherosclerotic plaque in the left main, left anterior descending, left circumflex and right coronary arteries. Status post median sternotomy for  CABG including LIMA to the LAD. Thickening calcification of the aortic valve. Mediastinum/Lymph Nodes: No pathologically enlarged mediastinal or hilar lymph nodes. Please note that accurate exclusion of hilar adenopathy is limited on noncontrast CT scans. Esophagus is unremarkable in appearance. No axillary lymphadenopathy. Lungs/Pleura: No suspicious appearing pulmonary nodules or masses are noted. No acute consolidative airspace disease. No pleural effusions. Musculoskeletal/Soft Tissues: Median sternotomy wires. There are no aggressive appearing lytic or blastic lesions noted in the visualized portions of the skeleton. CTA ABDOMEN AND PELVIS FINDINGS Hepatobiliary: No suspicious cystic or solid hepatic lesions. No intra or extrahepatic biliary ductal dilatation. Status post cholecystectomy. Pancreas: No pancreatic mass. No pancreatic ductal dilatation. No pancreatic or peripancreatic fluid collections or inflammatory changes. Spleen: Unremarkable. Adrenals/Urinary Tract: Low-attenuation lesions in the kidneys bilaterally, compatible with simple cysts, largest of which is in the upper pole of the right kidney measuring 3 cm in diameter. Mild bilateral renal atrophy with mild diffuse cortical thinning. No suspicious renal lesions. Bilateral adrenal glands are normal in appearance. No hydroureteronephrosis. Urinary bladder is normal in appearance. Stomach/Bowel: Normal appearance of the stomach. No pathologic dilatation of small bowel or colon. Numerous colonic diverticulae are noted, without surrounding inflammatory changes to suggest an acute diverticulitis at this  time. Normal appendix. Vascular/Lymphatic: Aortic atherosclerosis, with vascular findings and measurements pertinent to potential TAVR procedure, as detailed below. In addition, there is multifocal aneurysmal dilatation and short segment dissections of both the abdominal aorta and the common iliac arteries bilaterally. Multifocal fusiform aneurysmal dilatation of the infrarenal abdominal aorta measuring up to 3.8 x 3.1 cm proximally and 4.1 x 3.0 cm more distally, with multiple short segment dissection flaps within these regions. Aneurysmal dilatation of the common iliac arteries measuring 1.9 cm in diameter on the right and 1.6 cm in diameter on the left. No lymphadenopathy noted in the abdomen or pelvis. Reproductive: Prostate gland and seminal vesicles are unremarkable in appearance. Other: No significant volume of ascites.  No pneumoperitoneum. Musculoskeletal: There are no aggressive appearing lytic or blastic lesions noted in the visualized portions of the skeleton. VASCULAR MEASUREMENTS PERTINENT TO TAVR: AORTA: Minimal Aortic Diameter-15 x 11 mm Severity of Aortic Calcification-severe RIGHT PELVIS: Right Common Iliac Artery - Minimal Diameter-7.9 x 7.1 mm Tortuosity-mild Calcification-moderate to severe Comment-aneurysmally dilated with short segment dissection Right External Iliac Artery - Minimal Diameter-9.6 x 8.4 mm Tortuosity-severe Calcification-moderate to severe Right Common Femoral Artery - Minimal Diameter-7.5 x 7.5 mm Tortuosity-mild Calcification-moderate to severe LEFT PELVIS: Left Common Iliac Artery - Minimal Diameter-7.9 x 6.5 mm Tortuosity-mild Calcification-moderate to severe Comment-aneurysmally dilated with short segment dissection Left External Iliac Artery - Minimal Diameter-9.6 x 7.6 mm Tortuosity-moderate Calcification-moderate to severe Left Common Femoral Artery - Minimal Diameter-8.7 x 6.1 mm Tortuosity-mild Calcification-moderate to severe Review of the MIP images confirms the  above findings. IMPRESSION: 1. Vascular findings and measurements pertinent to potential TAVR procedure, as detailed above. Please note the presence of multiple aneurysms and short-segment dissections involving the infrarenal abdominal aorta as well as the common iliac arteries bilaterally. 2. Severe thickening calcification of the aortic valve, compatible with the reported clinical history of severe aortic stenosis. 3. Aortic atherosclerosis, in addition to left main and 3 vessel coronary artery disease. Status post median sternotomy for CABG including LIMA to the LAD. 4. Colonic diverticulosis without evidence of acute diverticulitis at this time. 5. Additional incidental findings, as above. Electronically Signed   By: Vinnie Langton M.D.   On: 10/13/2019 12:59   Disposition   Pt is  being discharged home today in good condition.  Follow-up Plans & Appointments     Follow-up Information    Eileen Stanford, PA-C. Go on 11/19/2019.   Specialties: Cardiology, Radiology Why: @ 1:30pm, please arrive at least 10 minutes early.  Contact information: [address] [place] [id number] [phone]                Discharge Medications   Allergies as of 11/11/2019      Reactions   Amoxicillin Other (See Comments)   Headache, debilitated   Bee Venom Anaphylaxis   Morphine And Related Other (See Comments)   hypotension      Medication List    TAKE these medications   acetaminophen 500 MG tablet Commonly known as: TYLENOL Take 500 mg by mouth in the morning and at bedtime.   allopurinol 300 MG tablet Commonly known as: ZYLOPRIM Take 150 mg by mouth Daily.   amLODipine 5 MG tablet Commonly known as: NORVASC TAKE 1 TABLET BY MOUTH DAILY What changed: when to take this   cholecalciferol 1000 units tablet Commonly known as: VITAMIN D Take 1,000 Units by mouth daily.   EPINEPHrine 0.3 mg/0.3 mL Soaj injection Commonly known as: EPI-PEN Inject 0.3 mLs  (0.3 mg total) into the muscle as needed for anaphylaxis.   fish oil-omega-3 fatty acids 1000 MG capsule Take 1 g by mouth daily.   furosemide 40 MG tablet Commonly known as: LASIX TAKE 1 TABLET BY MOUTH EVERY DAY   galantamine 8 MG 24 hr capsule Commonly known as: RAZADYNE ER Take 8 mg by mouth Daily.   HONEY BEE VENOM IJ Inject 2 each as directed See admin instructions. Every eight weeks   isosorbide mononitrate 60 MG 24 hr tablet Commonly known as: IMDUR TAKE 1 TABLET(60 MG) BY MOUTH DAILY AFTER SUPPER What changed: See the new instructions.   memantine 10 MG tablet Commonly known as: NAMENDA Take 10 mg by mouth 2 (two) times daily.   Nitrostat 0.4 MG SL tablet Generic drug: nitroGLYCERIN Place 0.4 mg under the tongue every 5 (five) minutes x 3 doses as needed for chest pain.   pantoprazole 40 MG tablet Commonly known as: PROTONIX Take 40 mg by mouth every other day.   potassium chloride 10 MEQ CR capsule Commonly known as: MICRO-K TAKE 1 CAPSULE BY MOUTH DAILY WITH LASIX What changed: See the new instructions.   predniSONE 1 MG tablet Commonly known as: DELTASONE Take 3 mg by mouth daily with breakfast.   PRESCRIPTION MEDICATION Cpap   Psyllium Husk Powd Take 1 Scoop by mouth daily.   rivaroxaban 20 MG Tabs tablet Commonly known as: XARELTO Take 20 mg by mouth daily.   rosuvastatin 10 MG tablet Commonly known as: CRESTOR Take 10 mg by mouth daily.   telmisartan 40 MG tablet Commonly known as: MICARDIS Take 20 mg by mouth daily.   triamcinolone cream 0.1 % Commonly known as: KENALOG Apply 1 application topically as needed (skin irritation).   vitamin B-12 1000 MCG tablet Commonly known as: CYANOCOBALAMIN Take 1,000 mcg by mouth daily.   Zetia 10 MG tablet Generic drug: ezetimibe Take 10 mg by mouth Daily.           Outstanding Labs/Studies   none  Duration of Discharge Encounter   Greater than 30 minutes including physician  time.  Mable Fill, PA-C 11/11/2019, 9:32 AM [phone]

## 2019-11-11 NOTE — Progress Notes (Signed)
CARDIAC REHAB PHASE I   PRE:  Rate/Rhythm: 80 SR    BP: sitting 147/83    SaO2: 97 RA  MODE:  Ambulation: 470 ft   POST:  Rate/Rhythm: 106     BP: sitting 175/113, recheck 183/106     SaO2: 97 RA  Pt feeling well. Able to stand and walk with RW. Some SOB but wanted to walk entire hall. HR stable in 80-90s till end of walk. At door rate increased for a few seconds on telebox, ?afib vs SVT.  On review at desk only 1-2 seconds around 10:00. Pt asx. BP very elevated after walk.   Discussed walking and increasing his endurance at home. He prefers to get his rollator from medical supply after discharge. He has a cane he can use. Will refer to [facility]. Discussed restrictions. [phone]  [place], ACSM 11/11/2019 10:30 AM

## 2019-11-11 NOTE — Progress Notes (Signed)
  Echocardiogram 2D Echocardiogram has been performed.  [name] [name] 11/11/2019, 9:53 AM

## 2019-11-11 NOTE — Progress Notes (Signed)
1 Day Post-Op Procedure(s) (LRB): TRANSCATHETER AORTIC VALVE REPLACEMENT, LEFT SUBCLAVIAN USING EDWARDS SAPIEN 3 ULTRA 26 MM THV AORTIC VALVE. (Left) TRANSESOPHAGEAL ECHOCARDIOGRAM (TEE) (N/A) Subjective:  Ambulated this am. Says he still had some shortness of breath but better. No further chest pains.  Rhythm changed overnight and now looks sinus 80's.  ECG reviewed. I think it is sinus with bifascicular block.  Objective: Vital signs in last 24 hours: Temp:  [97 F (36.1 C)-98.6 F (37 C)] 98.3 F (36.8 C) (11/10 0403) Pulse Rate:  [0-138] 73 (11/10 0403) Cardiac Rhythm: Normal sinus rhythm (11/10 0403) Resp:  [12-20] 17 (11/10 0403) BP: (103-125)/(66-86) 125/79 (11/10 0403) SpO2:  [92 %-100 %] 96 % (11/10 0403) Arterial Line BP: (117-133)/(63-69) 132/66 (11/09 1140) Weight:  [111.9 kg] 111.9 kg (11/10 0620)  Hemodynamic parameters for last 24 hours:    Intake/Output from previous day: 11/09 0701 - 11/10 0700 In: 2885 [P.O.:960; I.V.:1325; IV Piggyback:600] Out: 1750 [Urine:1700; Blood:50] Intake/Output this shift: No intake/output data recorded.  General appearance: alert and cooperative Neurologic: intact Heart: regular rate and rhythm, S1, S2 normal, no murmur Lungs: clear to auscultation bilaterally Extremities: warm, no edema. Right groin site ok Wound: left subclavian incision looks ok  Lab Results: Recent Labs    11/10/19 1108 11/11/19 0416  WBC  --  7.0  HGB 10.5* 9.6*  HCT 31.0* 30.3*  PLT  --  128*   BMET:  Recent Labs    11/10/19 1108 11/11/19 0416  NA 140 138  K 3.8 4.4  CL 106 106  CO2  --  24  GLUCOSE 148* 125*  BUN 25* 25*  CREATININE 1.20 1.26*  CALCIUM  --  9.5    PT/INR: No results for input(s): LABPROT, INR in the last 72 hours. ABG    Component Value Date/Time   PHART 7.422 11/06/2019 0946   HCO3 23.3 11/06/2019 0946   TCO2 24 11/10/2019 1108   ACIDBASEDEF 0.5 11/06/2019 0946   O2SAT 97.9 11/06/2019 0946   CBG (last 3)   Recent Labs    11/10/19 0622 11/10/19 1650  GLUCAP 101* 140*    Assessment/Plan: S/P Procedure(s) (LRB): TRANSCATHETER AORTIC VALVE REPLACEMENT, LEFT SUBCLAVIAN USING EDWARDS SAPIEN 3 ULTRA 26 MM THV AORTIC VALVE. (Left) TRANSESOPHAGEAL ECHOCARDIOGRAM (TEE) (N/A)  Hemodynamically stable. Rhythm looks sinus with bifascicular block on ECG. He had RBBB preop. No CHB postop. Will plan to send home with Zio.  2D echo this am. Continue ambulation and plan home later today.   LOS: 1 day    Gaye Pollack 11/11/2019

## 2019-11-11 NOTE — Progress Notes (Signed)
Zio patch placed onto patient.  All instructions and information reviewed with patient, they verbalize understanding with no questions. 

## 2019-11-11 NOTE — Progress Notes (Signed)
Order received to discharge patient.  Telemetry monitor removed and CCMD notified.  PIV access removed.  Left IJ removed per MD order without difficulty.  Pt educated on bedrest for one hour and BP's being cycled.  Discharge instructions, follow up, medications and instructions for their use discussed with patient.

## 2019-11-12 ENCOUNTER — Telehealth: Payer: Self-pay | Admitting: Physician Assistant

## 2019-11-12 ENCOUNTER — Telehealth: Payer: Self-pay | Admitting: Cardiology

## 2019-11-12 LAB — TYPE AND SCREEN
ABO/RH(D): O POS
Antibody Screen: NEGATIVE
Unit division: 0
Unit division: 0

## 2019-11-12 LAB — BPAM RBC
Blood Product Expiration Date: 202112112359
Blood Product Expiration Date: 202112112359
ISSUE DATE / TIME: 202111090911
ISSUE DATE / TIME: 202111090911
Unit Type and Rh: 5100
Unit Type and Rh: 5100

## 2019-11-12 NOTE — Telephone Encounter (Signed)
Thanks for the update, I am seeing him on 11/23 and will address

## 2019-11-12 NOTE — Telephone Encounter (Signed)
  [facility] VALVE TEAM   Patient contacted regarding discharge from [facility] on 11/11/19  Patient understands to follow up with provider Nell Range on 11/18 at [place].  Patient understands discharge instructions? yes Patient understands medications and regimen? yes Patient understands to bring all medications to this visit? yes  Angelena Form PA-C  [facility]

## 2019-11-12 NOTE — Telephone Encounter (Signed)
Thanks jill.

## 2019-11-12 NOTE — Telephone Encounter (Signed)
    Called from [facility] regarding patients monitor showing atrial fibrillation. Appears the patient has a recent hx of atrial flutter and was restarted on Xarelto post procedure. HR's documented at 105bpm. Forwarding message to Nell Range, PA and Dr. Einar Gip.   Kathyrn Drown NP-C [place] Pager: [phone]

## 2019-11-12 NOTE — Telephone Encounter (Signed)
He underwent successful TAVR via the subclavian approach today 11/10/19. Please move my apt on 11/23 out and add an echo. Window for visits are 12/03/2019-01/19/2020.  Thanks !  I have put Echo order.  Please cancell next OV on the Nov 23rd I think.   JG

## 2019-11-13 ENCOUNTER — Encounter: Payer: Self-pay | Admitting: Cardiology

## 2019-11-13 ENCOUNTER — Ambulatory Visit: Payer: Medicare Other | Admitting: Cardiology

## 2019-11-13 ENCOUNTER — Telehealth: Payer: Self-pay | Admitting: Cardiovascular Disease

## 2019-11-13 ENCOUNTER — Telehealth: Payer: Self-pay

## 2019-11-13 ENCOUNTER — Other Ambulatory Visit: Payer: Self-pay | Admitting: Cardiology

## 2019-11-13 ENCOUNTER — Other Ambulatory Visit: Payer: Self-pay

## 2019-11-13 VITALS — BP 132/61 | HR 81 | Resp 14 | Ht 73.0 in | Wt 252.0 lb

## 2019-11-13 DIAGNOSIS — Z952 Presence of prosthetic heart valve: Secondary | ICD-10-CM | POA: Diagnosis not present

## 2019-11-13 DIAGNOSIS — I5033 Acute on chronic diastolic (congestive) heart failure: Secondary | ICD-10-CM | POA: Diagnosis not present

## 2019-11-13 DIAGNOSIS — I480 Paroxysmal atrial fibrillation: Secondary | ICD-10-CM

## 2019-11-13 MED ORDER — METOPROLOL TARTRATE 25 MG PO TABS: 25.0000 mg | ORAL_TABLET | Freq: Three times a day (TID) | ORAL | 2 refills | Status: DC | PRN

## 2019-11-13 MED ORDER — FUROSEMIDE 40 MG PO TABS: 40.0000 mg | ORAL_TABLET | Freq: Two times a day (BID) | ORAL | 2 refills | Status: DC

## 2019-11-13 MED ORDER — AMIODARONE HCL 200 MG PO TABS: 200.0000 mg | ORAL_TABLET | Freq: Two times a day (BID) | ORAL | 1 refills | Status: DC

## 2019-11-13 NOTE — Telephone Encounter (Signed)
° ° °  Pt and wife calling, they said pt feeling her HR racing today, it will go up to 120 then down to 70. Pt is feeling a little sob and dizziness. They would like to know if this is normal since pt had his procedure recently.

## 2019-11-13 NOTE — Telephone Encounter (Signed)
Do ya'll mind checking on him? He had atrial flutter before and after his procedure. Thanks, Gerald Stabs

## 2019-11-13 NOTE — Telephone Encounter (Signed)
Thanks ladies. Appreciate it. [name] [name]

## 2019-11-13 NOTE — Telephone Encounter (Signed)
[name] [name] has already spoken with the pt today and the pt is scheduled for evaluation at 12:00 with Dr Einar Gip.

## 2019-11-13 NOTE — Telephone Encounter (Signed)
Pt wife called to inform us that pt said his HR racing today, it will go up to 140 then down to 50. Pt is feeling a little sob and dizziness. They would like to know if this is normal since pt had his procedure recently.

## 2019-11-13 NOTE — Progress Notes (Signed)
Primary Physician/Referring:  [name], MD  Patient ID: [name] [name], male    DOB: 04/08/1926, 86 y.o.   MRN: [id number]  Chief Complaint  Patient presents with  . Coronary Artery Disease  . Paroxysmal atrial fibrillation  . Follow-up    STAT   HPI:    NABOR THOMANN  is a 86 y.o. Caucasian male with vitamin B12 deficiency, mild dementia, hyperlipidemia, hypertension, CAD, chronic stable angina status post CABG x 3 and Maze and LAA amputation in 2014, paroxysmal atrial fibrillation, obstructive sleep apnea not currently on CPAP, moderate aortic stenosis with thoracic aortic aneurysm and dilation of ascending aorta and follows Dr. Lilly Cove (No correlation with TTE with regards to aneurysm), venous stasis edema.  He underwent TAVR on 11/10/2019 via left subclavian artery approach using Edwards Sapien 3 ultra THV 26 mm aortic valve prosthesis.  He presented to the hospital with atypical atrial flutter with 2: 1 conduction prior to the procedure.  In view of advanced age, aortic valve disease, baseline bradycardia, he was not placed on any negative chronotropic agents, he spontaneously converted to sinus.  He was placed on Zio patch live monitoring.  He has had several episodes of rapid heartbeat, this morning he called [place] stating that he has been very short of breath, has been having elevated heart rate around 110 to 140 bpm and feels poorly.  I made an urgent visit to see me today.  He is accompanied by his wife.  He has not had any dizziness or syncope.  Leg swelling has slightly worsened since procedure.  Past Medical History:  Diagnosis Date  . AAA (abdominal aortic aneurysm) ([place])   . Acute coronary syndrome ([place]) 02/29/2012  . Acute systolic heart failure ([place]) 03/03/2012  . Angina   . Arthritis   . Ascending aorta dilatation (HCC) 03/13/2012   Fusiform dilatation of the ascending thoracic aorta discovered during surgery, max diameter 4.2-4.3 cm  . Atrial fibrillation ([place])  02/29/2012   Recurrent paroxysmal, new-onset  . BPH (benign prostatic hypertrophy)   . CAD (coronary artery disease)    Cath April 2012 40% left main, 50% LAD, 40% OM, occluded RCA with L-R collaterals   . CHF (congestive heart failure) ([place])   . Chronic venous insufficiency   . Dyspnea   . GERD (gastroesophageal reflux disease)   . Glaucoma   . Gout   . Headache    MIGRAINES in the past  . History of kidney stones   . Hyperlipidemia   . Hypertension   . MI (myocardial infarction) ([place])   . Obesity (BMI 30-39.9)   . Paroxysmal atrial fibrillation (HCC) 02/29/2012   Recurrent paroxysmal, new-onset   . PMR (polymyalgia rheumatica) (HCC)   . Pre-diabetes   . S/P CABG x 3 03/06/2012   LIMA to LAD, SVG to OM, SVG to RCA, EVH via right thigh  . S/P Maze operation for atrial fibrillation 03/06/2012   Complete bilateral lesions set using bipolar radiofrequency and cryothermy ablation with clipping of LA appendage  . 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   s/p TAVR with a 26 mm Edwards via the left subclavian by Drs Buena Irish and Cyndia Bent  . Severe aortic stenosis 09/29/2019  . Sleep apnea    USES CPAP NIGHTLY   Past Surgical History:  Procedure Laterality Date  . CARDIAC CATHETERIZATION    . CATARACT EXTRACTION, BILATERAL     with lens implants  . CHOLECYSTECTOMY N/A 03/24/2012   Procedure: LAPAROSCOPIC CHOLECYSTECTOMY  WITH INTRAOPERATIVE CHOLANGIOGRAM;  Surgeon: Zenovia Jarred, MD;  Location: [place];  Service: General;  Laterality: N/A;  . CORONARY ARTERY BYPASS GRAFT N/A 03/06/2012   Procedure: CORONARY ARTERY BYPASS GRAFTING (CABG);  Surgeon: Rexene Alberts, MD;  Location: [place];  Service: Open Heart Surgery;  Laterality: N/A;  . ENDOVEIN HARVEST OF GREATER SAPHENOUS VEIN Right 03/06/2012   Procedure: ENDOVEIN HARVEST OF GREATER SAPHENOUS VEIN;  Surgeon: Rexene Alberts, MD;  Location: [place];  Service: Open Heart Surgery;  Laterality: Right;  . INTRAOPERATIVE  TRANSESOPHAGEAL ECHOCARDIOGRAM N/A 03/06/2012   Procedure: INTRAOPERATIVE TRANSESOPHAGEAL ECHOCARDIOGRAM;  Surgeon: Rexene Alberts, MD;  Location: [place];  Service: Open Heart Surgery;  Laterality: N/A;  . LEFT HEART CATH Right 03/02/2012   Procedure: LEFT HEART CATH;  Surgeon: Sherren Mocha, MD;  Location: [facility] CATH LAB;  Service: Cardiovascular;  Laterality: Right;  Marland Kitchen MAZE N/A 03/06/2012   Procedure: MAZE;  Surgeon: Rexene Alberts, MD;  Location: [place];  Service: Open Heart Surgery;  Laterality: N/A;  . PROSTATECTOMY     partial  . RIGHT/LEFT HEART CATH AND CORONARY/GRAFT ANGIOGRAPHY N/A 09/29/2019   Procedure: RIGHT/LEFT HEART CATH AND CORONARY/GRAFT ANGIOGRAPHY;  Surgeon: Adrian Prows, MD;  Location: [place] CV LAB;  Service: Cardiovascular;  Laterality: N/A;  . TEE WITHOUT CARDIOVERSION N/A 11/10/2019   Procedure: TRANSESOPHAGEAL ECHOCARDIOGRAM (TEE);  Surgeon: Burnell Blanks, MD;  Location: [place];  Service: Open Heart Surgery;  Laterality: N/A;  . TRANSCATHETER AORTIC VALVE REPLACEMENT, TRANSFEMORAL  11/10/2019   Family History  Problem Relation Age of Onset  . Dementia Mother   . Stroke Brother     Social History   Tobacco Use  . Smoking status: Former Smoker    Packs/day: 0.50    Years: 15.00    Pack years: 7.50    Types: Cigarettes    Quit date: 11/18/1962    Years since quitting: 57.0  . Smokeless tobacco: Never Used  Substance Use Topics  . Alcohol use: Yes    Alcohol/week: 2.0 standard drinks    Types: 2 Shots of liquor per week    Comment: daily   ROS  Review of Systems  Constitutional: Positive for malaise/fatigue.  Cardiovascular: Positive for dyspnea on exertion, irregular heartbeat and leg swelling (chronic and recently worse). Negative for chest pain.  Musculoskeletal: Positive for arthritis (mild and stable), joint pain and myalgias (recent diagnosis of PMR).  Gastrointestinal: Positive for constipation. Negative for melena.  Neurological: Negative for  dizziness.   Objective  Blood pressure 132/61, pulse 81, resp. rate 14, height 6\' 1"  (1.854 m), weight 252 lb (114.3 kg), SpO2 98 %.  Vitals with BMI 11/13/2019 11/11/2019 11/11/2019  Height 6\' 1"  - -  Weight 252 lbs - 246 lbs 11 oz  BMI 60.45 - 40.98  Systolic 119 147 -  Diastolic 61 83 -  Pulse 81 70 -     Physical Exam Constitutional:      General: He is not in acute distress.    Appearance: He is well-developed.  Cardiovascular:     Rate and Rhythm: Normal rate and regular rhythm.     Pulses:          Carotid pulses are 2+ on the right side and 2+ on the left side.      Radial pulses are 2+ on the right side and 2+ on the left side.       Femoral pulses are 2+ on the right side and 2+  on the left side.      Popliteal pulses are 2+ on the right side and 2+ on the left side.       Dorsalis pedis pulses are 0 on the right side and 1+ on the left side.       Posterior tibial pulses are 0 on the right side and 0 on the left side.     Heart sounds: Murmur heard.  Early systolic murmur is present with a grade of 2/6 at the upper right sternal border. S1 is normal, S2 is muffled.   No gallop.      Comments: Vascular examination reveals bilateral pigmented lower extremities with varicose veins.  2-3+ pitting edema. No JVD.  Pulmonary:     Effort: Pulmonary effort is normal.     Breath sounds: Rales (bibasilar) present.  Abdominal:     General: Bowel sounds are normal.     Palpations: Abdomen is soft.    Laboratory examination:   Recent Labs    09/23/19 1341 09/29/19 0759 10/09/19 1151 10/13/19 0838 10/22/19 1146 10/22/19 1146 11/06/19 0836 11/10/19 0914 11/10/19 0944 11/10/19 1108 11/11/19 0416  NA 140   < > 141   < > 140   < > 138   < > 140 140 138  K 4.6   < > 4.1   < > 4.1   < > 4.0   < > 3.8 3.8 4.4  CL 101  --  103   < > 105   < > 106   < > 104 106 106  CO2 23  --  23   < > 24  --  21*  --   --   --  24  GLUCOSE 117*  --  115*   < > 98   < > 161*   < > 127*  148* 125*  BUN 30  --  25   < > 25*   < > 27*   < > 25* 25* 25*  CREATININE 1.45*  --  1.33*   < > 1.43*   < > 1.37*   < > 1.10 1.20 1.26*  CALCIUM 9.8  --  10.0   < > 9.7  --  9.5  --   --   --  9.5  GFRNONAA 42*  --  46*   < > 46*  --  49*  --   --   --  54*  GFRAA 48*  --  54*  --   --   --   --   --   --   --   --    < > = values in this interval not displayed.   estimated creatinine clearance is 50.6 mL/min (A) (by C-G formula based on SCr of 1.26 mg/dL (H)).  CMP Latest Ref Rng & Units 11/11/2019 11/10/2019 11/10/2019  Glucose 70 - 99 mg/dL 125(H) 148(H) 127(H)  BUN 8 - 23 mg/dL 25(H) 25(H) 25(H)  Creatinine 0.61 - 1.24 mg/dL 1.26(H) 1.20 1.10  Sodium 135 - 145 mmol/L 138 140 140  Potassium 3.5 - 5.1 mmol/L 4.4 3.8 3.8  Chloride 98 - 111 mmol/L 106 106 104  CO2 22 - 32 mmol/L 24 - -  Calcium 8.9 - 10.3 mg/dL 9.5 - -  Total Protein 6.5 - 8.1 g/dL - - -  Total Bilirubin 0.3 - 1.2 mg/dL - - -  Alkaline Phos 38 - 126 U/L - - -  AST 15 - 41 U/L - - -  ALT 0 - 44 U/L - - -   CBC Latest Ref Rng & Units 11/11/2019 11/10/2019 11/10/2019  WBC 4.0 - 10.5 K/uL 7.0 - -  Hemoglobin 13.0 - 17.0 g/dL 9.6(L) 10.5(L) 9.9(L)  Hematocrit 39 - 52 % 30.3(L) 31.0(L) 29.0(L)  Platelets 150 - 400 K/uL 128(L) - -   TSH Recent Labs    09/23/19 1341  TSH 1.950    External labs:   Cholesterol, total 115.000 m 01/19/2019 HDL 47 MG/DL 01/19/2019 LDL 51.000 mg 01/19/2019 Triglycerides 84.000 01/19/2019  A1C 5.600 % 01/19/2019; TSH 2.320 01/19/2019  Hemoglobin 10.900 g/ 02/06/2019; INR 1.900 09/11/2018 Platelets 182.000 09/11/2018  Creatinine, Serum 1.300 mg/ 02/06/2019 Potassium 4.100 09/11/2018 Magnesium N/D ALT (SGPT) 15.000 uni 01/19/2019  Medications and allergies   Allergies  Allergen Reactions  . Amoxicillin Other (See Comments)    Headache, debilitated  . Bee Venom Anaphylaxis  . Morphine And Related Other (See Comments)    hypotension    Current Outpatient Medications on File Prior to  Visit  Medication Sig Dispense Refill  . acetaminophen (TYLENOL) 500 MG tablet Take 500 mg by mouth in the morning and at bedtime.    Marland Kitchen allopurinol (ZYLOPRIM) 300 MG tablet Take 150 mg by mouth Daily.    Marland Kitchen amLODipine (NORVASC) 5 MG tablet TAKE 1 TABLET BY MOUTH DAILY (Patient taking differently: Take 5 mg by mouth every evening. ) 90 tablet 3  . cholecalciferol (VITAMIN D) 1000 UNITS tablet Take 1,000 Units by mouth daily.    Marland Kitchen EPINEPHrine 0.3 mg/0.3 mL IJ SOAJ injection Inject 0.3 mLs (0.3 mg total) into the muscle as needed for anaphylaxis. 2 each 2  . fish oil-omega-3 fatty acids 1000 MG capsule Take 1 g by mouth daily.     Marland Kitchen galantamine (RAZADYNE ER) 8 MG 24 hr capsule Take 8 mg by mouth Daily.    . HONEY BEE VENOM IJ Inject 2 each as directed See admin instructions. Every eight weeks    . isosorbide mononitrate (IMDUR) 60 MG 24 hr tablet TAKE 1 TABLET(60 MG) BY MOUTH DAILY AFTER SUPPER (Patient taking differently: Take 60 mg by mouth every evening. ) 30 tablet 1  . memantine (NAMENDA) 10 MG tablet Take 10 mg by mouth 2 (two) times daily.    Marland Kitchen NITROSTAT 0.4 MG SL tablet Place 0.4 mg under the tongue every 5 (five) minutes x 3 doses as needed for chest pain.     . pantoprazole (PROTONIX) 40 MG tablet Take 40 mg by mouth every other day.   4  . potassium chloride (MICRO-K) 10 MEQ CR capsule TAKE 1 CAPSULE BY MOUTH DAILY WITH LASIX (Patient taking differently: Take 10 mEq by mouth daily. ) 90 capsule 1  . predniSONE (DELTASONE) 1 MG tablet Take 3 mg by mouth daily with breakfast.     . PRESCRIPTION MEDICATION Cpap    . Psyllium Husk POWD Take 1 Scoop by mouth daily.     . Rivaroxaban (XARELTO) 15 MG TABS tablet Take 15 mg by mouth daily with supper.    . rivaroxaban (XARELTO) 20 MG TABS tablet Take 20 mg by mouth daily.    . rosuvastatin (CRESTOR) 10 MG tablet Take 10 mg by mouth daily.     Marland Kitchen telmisartan (MICARDIS) 40 MG tablet Take 20 mg by mouth daily.     Marland Kitchen triamcinolone cream (KENALOG) 0.1  % Apply 1 application topically as needed (skin irritation).     . vitamin B-12 (CYANOCOBALAMIN) 1000 MCG tablet Take 1,000  mcg by mouth daily.     Marland Kitchen ZETIA 10 MG tablet Take 10 mg by mouth Daily.      No current facility-administered medications on file prior to visit.    Radiology:   CT angiogram of the chest 04/29/2017: Stable ascending thoracic aortic aneurysm measuring up to 4.3 cm. Recommend annual imaging followup by CTA or MRA. This recommendation follows 2010 ACCF/AHA/AATS/ACR/ASA/SCA/SCAI/SIR/STS/SVM Guidelines for the Diagnosis and Management of Patients with Thoracic Aortic Disease. Circulation. 2010; 121: e266-e369  Atherosclerotic changes thoracic aorta with irregular plaque/small ulceration thoracic-abdominal aortic junction similar to prior exam.  Atherosclerotic changes origin of great vessels most notable involving the left subclavian artery.  Atherosclerotic changes with narrowing superior mesenteric artery and right renal artery.  Superior aspect of abdominal aortic aneurysm incompletely assessed on the present exam.  Post CABG.  Prominent coronary artery calcification.  Aortic Atherosclerosis (ICD10-I70.0).  Cardiac Studies:   Coronary angiogram 03/02/2012: 50-60% left main, 99% LAD, 40% OM, occluded RCA with L-R collaterals  Nuclear stress test   11/22/2017: 1. Lexiscan stress test with low level exercise was performed. Exercise capacity was not assessed. Stress symptoms included dyspnea. Peak effect blood pressure was 114/66 mmHg. The resting and stress electrocardiogram demonstrated normal sinus rhythm, RBBB, no resting arrhythmias and normal rest repolarization. Stress EKG is non diagnostic for ischemia as it is a pharmacologic stress. 2. The overall quality of the study is good. Left ventricular cavity is noted to be mildly enlarged on the rest and stress studies. Gated SPECT imaging demonstrated mild hypokinesis of the basal inferolateral and mid  inferolateral myocardial wall(s). The left ventricular ejection fraction was calculated or visually estimated to be 69%. Medium sized, medium intensity, fixed perfusion defect in mid to basal inferolateral myocardium suggests old LCx territory infarct with no periinfarct ischemia. 3. Low risk study.   ABI 01/30/2019: Right: Resting right ankle-brachial index indicates moderate right lower extremity arterial disease. The right toe-brachial index is abnormal.  Left: Resting left ankle-brachial index indicates mild left lower extremity arterial disease. The left toe-brachial index is abnormal.   Echocardiogram 03/05/2019:  Mildly depressed LV systolic function with visual EF 45-50%. Mild left  ventricular hypertrophy. Mild global hypokinesis. No obvious regional wall  motion abnormalities. Left ventricle cavity is normal in size. Elevated  LAP. Calculated EF 46%.  Trileaflet aortic valve. Moderate aortic stenosis. Mild (Grade I) aortic  regurgitation. Thickened and calcified aortic valve leaflets with reduced cusp separation.  Peak velocity 3.25m/s, mean gradient 2mmHg, Dimensional  index 0.24, AVA 1.0cm2, findings suggestive of moderate to severe aortic  stenosis.  Mild (Grade I) mitral regurgitation.  Mild tricuspid regurgitation.  Mild pulmonic regurgitation.  IVC is dilated with a respiratory response of >50%.  Prior study dated 11/2017: LVEF 56%, mild AR, mild AS (PG 63mmHG, MG  44mmHG, AVA 1.26 cm) mild pulmonary hypertension RVSP 45mmHG, remaining valvular heart disease remains stable in comparison.  EKG  EKG 09/23/2019: Underlying sinus rhythm at rate of 61 bpm with PACs, leftw axis, left intrafascicular block. Right bundle branch block. Bifascicular block. Nonspecific T abnormality. No significant change from EKG 03/20/2019, No significant change from  EKG 09/22/2018: (Probably ectopic atrial rhythm). No significant change from  EKG 08/11/2018.   Assessment     ICD-10-CM   1.  Paroxysmal atrial fibrillation (HCC)  I48.0 EKG 12-Lead    amiodarone (PACERONE) 200 MG tablet    metoprolol tartrate (LOPRESSOR) 25 MG tablet  2. Acute on chronic diastolic heart failure (HCC)  I50.33 furosemide (LASIX)  40 MG tablet  3. S/P TAVR with Edwards Sapien 3 ultra THV 26 mm prosthesis on 11/10/2019  Z95.2    Meds ordered this encounter  Medications  . amiodarone (PACERONE) 200 MG tablet    Sig: Take 1 tablet (200 mg total) by mouth 2 (two) times daily.    Dispense:  60 tablet    Refill:  1  . metoprolol tartrate (LOPRESSOR) 25 MG tablet    Sig: Take 1 tablet (25 mg total) by mouth 3 (three) times daily as needed. For rapid heart beat    Dispense:  60 tablet    Refill:  2  . furosemide (LASIX) 40 MG tablet    Sig: Take 1 tablet (40 mg total) by mouth 2 (two) times daily at 10 am and 4 pm.    Dispense:  60 tablet    Refill:  2    Medications Discontinued During This Encounter  Medication Reason  . furosemide (LASIX) 40 MG tablet     Recommendations:   DONYEL NESTER  is a  86 y.o. Caucasian male with Caucasian male with vitamin B12 deficiency, mild dementia, hyperlipidemia, hypertension, CAD, chronic stable angina status post CABG x 3 and Maze and LAA amputation in 2014, paroxysmal atrial fibrillation, obstructive sleep apnea not currently on CPAP, moderate aortic stenosis with thoracic aortic aneurysm and dilation of ascending aorta and follows Dr. Lilly Cove (No correlation with TTE with regards to aneurysm), venous stasis edema.  He underwent TAVR on 11/10/2019 via left subclavian artery approach using Edwards Sapien 3 ultra THV 26 mm aortic valve prosthesis.  He presented to the hospital with atypical atrial flutter with 2: 1 conduction prior to the procedure.  In view of advanced age, aortic valve disease, baseline bradycardia, he was not placed on any negative chronotropic agents, he spontaneously converted to sinus.  He was placed on Zio patch live monitoring.   I saw him  on urgent basis due to A. fib with RVR.  His heart rate is now improved while in the office.  I would like to start him on amiodarone 200 mg p.o. twice daily, also gave him metoprolol tartrate to be taken on a as needed basis for elevated heart rate.  Heart block needs to be kept in mind in view of aortic valve disease and advanced age and bifascicular block on the EKG.  Patient and his wife are aware of this.  He is in acute decompensated heart failure with significant weight gain and worsening leg edema.  We discussed regarding foods to avoid and also increased his Lasix from 40 mg once a day to twice daily dosing.  He has an appointment to see TAVR team next week and I have set up another office visit with me the following week so he will be continuously monitored.  There is no indication for hospitalization.  He is presently tolerating Xarelto at 15 mg in the evening, continue the same.  No bleeding diathesis.  Blood pressures well controlled.  He has not had any chest pain.  This was a 40-minute encounter.    Adrian Prows, MD, [facility] 11/13/2019, 1:17 PM Office: [phone]

## 2019-11-13 NOTE — Telephone Encounter (Signed)
Pt seeing Dr. Einar Gip at noon. Having symptomatic palpitations. Interrogation of Zio AT shows persistent afib with elevated rates. Dicussed with JG on the phone.

## 2019-11-13 NOTE — Telephone Encounter (Signed)
Thanks Jay!

## 2019-11-13 NOTE — Telephone Encounter (Signed)
He looked good, mild CHF and AF, started Amio. KT do an EKG when you see next week and I am seeing him again the following week

## 2019-11-14 ENCOUNTER — Other Ambulatory Visit: Payer: Self-pay | Admitting: Cardiology

## 2019-11-14 NOTE — Telephone Encounter (Signed)
Will do. Thanks.

## 2019-11-17 ENCOUNTER — Other Ambulatory Visit (HOSPITAL_COMMUNITY): Payer: Self-pay | Admitting: *Deleted

## 2019-11-17 DIAGNOSIS — Z952 Presence of prosthetic heart valve: Secondary | ICD-10-CM

## 2019-11-17 NOTE — Progress Notes (Signed)
[facility] [place]                                       Cardiology Office Note    Date:  11/19/2019   ID:  [name] [name], DOB 1926-01-25, MRN [id number]  PCP:  [name] Infante, MD  Cardiologist: Dr. Einar [name] / Dr. Angelena [name] & Dr. Cyndia [name] (TAVR)  CC: [facility] s/p TAVR  History of Present Illness:  [name] [name] is a 86 y.o. male with a history of  PAF on Xarelto, mild dementia, CAD s/p CABG and Maze/LAA Clip (2014-CHO), TAA noted in surgery followed by Dr. Roxy [name], anemia, polymyalgia rheumatica, OSA, chronic venous stasis, chronic combined S/D CHF, CKD stage IV, HTN, HLD, obesity, PAD, and severe AS s/p TAVR (11/07/19) who presents to clinic for follow up.   The patient underwent coronary artery bypass grafting 3 and Maze procedure on 03/06/2012 for severe three-vessel coronary artery disease and recurrent paroxysmal atrial fibrillation.At the time of his surgery his aortic valve was notably trileaflet with no aortic stenosis but mild leaflet sclerosis and mild aortic insufficiency.He was also noted to have mild to moderate fusiform dilatation of the ascending thoracic aorta which we have followed intermittently ever since. He was last seen in [place] office in 2019 at which time the diameter of his ascending aorta remained stable. The patient has been followed carefully for several years by Dr. Einar [name], and follow-up echocardiograms have demonstrated the development of aortic stenosis that has slowly progressed in severity. Most recent echocardiogram performed September 24, 2019 revealed low normal left ventricular systolic function with ejection fraction estimated 50 to 55%. The aortic valve appeared trileaflet with severe aortic stenosis. Peak and mean transvalvular gradients were reported 40 and 27 mmHg respectively, corresponding to aortic valve area calculated 0.97 cm. There was mild mitral regurgitation and mild to moderate tricuspid regurgitation.The  patient was recently seen in follow-up by Dr. Einar [name] and complained of worsening symptoms of exertional shortness of breath and fatigue. Diagnostic cardiac catheterization was performed September 29, 2019 showed stable CAD/bypass grafts and confirmed the presence of aortic stenosis with peak to peak and mean transvalvular gradients measured 41 and 33 mmHg respectively, corresponding to aortic valve area 1.09 cm. Right heart pressures were very mildly elevated.  He was evaluated by the multidisciplinary valve team and underwent successful TAVR with a 26 mm Edwards Sapien 3 Ultra THV via the subclavian approach on 11/10/19. Post operative echo showed EF 60%, normally functioning TAVR with a mean gradient of 8.5 mmHg and no PVL. He had intermittent atrial fib/flutter with elevated HRs. He converted to sinus prior to discharge. Given underlying conduction disease he was discharged with Zio patch. He was discharged on home Xarelto alone.  He was seen by Dr. Einar [name] on 11/13/19 for evaluation. He was found to be in afib with RVR and acute CHF. He was started on amio 200mg  BID/ Prn lopressor and lasix increased from 40mg  daily to BID.   Today he presents to clinic for follow up. Is feeling better since TAVR. Strength starting to come back. He did have a brief episode of chest pain yesterday. He did not have to take SL NTG. It has resolved. Still has chronic swelling in legs. Some soreness at subclavian site. No dizziness or syncope. No orthopnea or PND. Palpitations have gotten better since being on amiodarone. Has had some occasional  right hip pain.    Past Medical History:  Diagnosis Date  . AAA (abdominal aortic aneurysm) ([place])   . Acute coronary syndrome ([place]) 02/29/2012  . Acute systolic heart failure ([place]) 03/03/2012  . Angina   . Arthritis   . Ascending aorta dilatation ([facility]) 03/13/2012   Fusiform dilatation of the ascending thoracic aorta discovered during surgery, max diameter 4.2-4.3 cm  . Atrial  fibrillation ([place]) 02/29/2012   Recurrent paroxysmal, new-onset  . BPH (benign prostatic hypertrophy)   . CAD (coronary artery disease)    Cath April 2012 40% left main, 50% LAD, 40% OM, occluded RCA with L-R collaterals   . CHF (congestive heart failure) ([place])   . Chronic venous insufficiency   . Dyspnea   . GERD (gastroesophageal reflux disease)   . Glaucoma   . Gout   . Headache    MIGRAINES in the past  . History of kidney stones   . Hyperlipidemia   . Hypertension   . MI (myocardial infarction) ([place])   . Obesity (BMI 30-39.9)   . Paroxysmal atrial fibrillation (H[facility]) 02/29/2012   Recurrent paroxysmal, new-onset   . PMR (polymyalgia rheumatica) ([facility])   . Pre-diabetes   . S/P CABG x 3 03/06/2012   LIMA to LAD, SVG to OM, SVG to RCA, EVH via right thigh  . S/P Maze operation for atrial fibrillation 03/06/2012   Complete bilateral lesions set using bipolar radiofrequency and cryothermy ablation with clipping of LA appendage  . 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   s/p TAVR with a 26 mm Edwards via the left subclavian by Drs Buena Irish and [name] [name]  . Severe aortic stenosis 09/29/2019  . Sleep apnea    USES CPAP NIGHTLY    Past Surgical History:  Procedure Laterality Date  . CARDIAC CATHETERIZATION    . CATARACT EXTRACTION, BILATERAL     with lens implants  . CHOLECYSTECTOMY N/A 03/24/2012   Procedure: LAPAROSCOPIC CHOLECYSTECTOMY WITH INTRAOPERATIVE CHOLANGIOGRAM;  Surgeon: Zenovia Jarred, MD;  Location: [place];  Service: General;  Laterality: N/A;  . CORONARY ARTERY BYPASS GRAFT N/A 03/06/2012   Procedure: CORONARY ARTERY BYPASS GRAFTING (CABG);  Surgeon: Rexene Alberts, MD;  Location: [place];  Service: Open Heart Surgery;  Laterality: N/A;  . ENDOVEIN HARVEST OF GREATER SAPHENOUS VEIN Right 03/06/2012   Procedure: ENDOVEIN HARVEST OF GREATER SAPHENOUS VEIN;  Surgeon: Rexene Alberts, MD;  Location: [place];  Service: Open Heart Surgery;  Laterality: Right;  .  INTRAOPERATIVE TRANSESOPHAGEAL ECHOCARDIOGRAM N/A 03/06/2012   Procedure: INTRAOPERATIVE TRANSESOPHAGEAL ECHOCARDIOGRAM;  Surgeon: Rexene Alberts, MD;  Location: [place];  Service: Open Heart Surgery;  Laterality: N/A;  . LEFT HEART CATH Right 03/02/2012   Procedure: LEFT HEART CATH;  Surgeon: Sherren Mocha, MD;  Location: [facility] CATH LAB;  Service: Cardiovascular;  Laterality: Right;  Marland Kitchen MAZE N/A 03/06/2012   Procedure: MAZE;  Surgeon: Rexene Alberts, MD;  Location: [place];  Service: Open Heart Surgery;  Laterality: N/A;  . PROSTATECTOMY     partial  . RIGHT/LEFT HEART CATH AND CORONARY/GRAFT ANGIOGRAPHY N/A 09/29/2019   Procedure: RIGHT/LEFT HEART CATH AND CORONARY/GRAFT ANGIOGRAPHY;  Surgeon: Adrian Prows, MD;  Location: [place] CV LAB;  Service: Cardiovascular;  Laterality: N/A;  . TEE WITHOUT CARDIOVERSION N/A 11/10/2019   Procedure: TRANSESOPHAGEAL ECHOCARDIOGRAM (TEE);  Surgeon: Burnell Blanks, MD;  Location: [place];  Service: Open Heart Surgery;  Laterality: N/A;  . TRANSCATHETER AORTIC VALVE REPLACEMENT, TRANSFEMORAL  11/10/2019    Current  Medications: Outpatient Medications Prior to Visit  Medication Sig Dispense Refill  . acetaminophen (TYLENOL) 500 MG tablet Take 500 mg by mouth in the morning and at bedtime.    Marland Kitchen allopurinol (ZYLOPRIM) 300 MG tablet Take 150 mg by mouth Daily.    Marland Kitchen amiodarone (PACERONE) 200 MG tablet TAKE 1 TABLET(200 MG) BY MOUTH TWICE DAILY 60 tablet 0  . amLODipine (NORVASC) 5 MG tablet TAKE 1 TABLET BY MOUTH DAILY 90 tablet 3  . cholecalciferol (VITAMIN D3) 25 MCG (1000 UNIT) tablet Take 1,000 Units by mouth daily.     Marland Kitchen EPINEPHrine 0.3 mg/0.3 mL IJ SOAJ injection Inject 0.3 mLs (0.3 mg total) into the muscle as needed for anaphylaxis. 2 each 2  . fish oil-omega-3 fatty acids 1000 MG capsule Take 1 g by mouth daily.     . furosemide (LASIX) 40 MG tablet Take 1 tablet (40 mg total) by mouth 2 (two) times daily at 10 am and 4 pm. 60 tablet 2  . galantamine  (RAZADYNE ER) 8 MG 24 hr capsule Take 8 mg by mouth Daily.    . HONEY BEE VENOM IJ Inject 2 each as directed See admin instructions. Every eight weeks    . isosorbide mononitrate (IMDUR) 60 MG 24 hr tablet TAKE 1 TABLET(60 MG) BY MOUTH DAILY AFTER SUPPER 30 tablet 1  . memantine (NAMENDA) 10 MG tablet Take 10 mg by mouth 2 (two) times daily.    . metoprolol tartrate (LOPRESSOR) 25 MG tablet Take 1 tablet (25 mg total) by mouth 3 (three) times daily as needed. For rapid heart beat 60 tablet 2  . NITROSTAT 0.4 MG SL tablet Place 0.4 mg under the tongue every 5 (five) minutes x 3 doses as needed for chest pain.     . pantoprazole (PROTONIX) 40 MG tablet Take 40 mg by mouth every other day.   4  . potassium chloride (MICRO-K) 10 MEQ CR capsule TAKE 1 CAPSULE BY MOUTH DAILY WITH LASIX 90 capsule 1  . predniSONE (DELTASONE) 1 MG tablet Take 3 mg by mouth daily with breakfast.     . PRESCRIPTION MEDICATION Cpap    . Psyllium Husk POWD Take 1 Scoop by mouth daily.     . Rivaroxaban (XARELTO) 15 MG TABS tablet Take 15 mg by mouth daily with supper.    . rosuvastatin (CRESTOR) 10 MG tablet Take 10 mg by mouth daily.     Marland Kitchen telmisartan (MICARDIS) 40 MG tablet Take 20 mg by mouth daily.     Marland Kitchen triamcinolone cream (KENALOG) 0.1 % Apply 1 application topically as needed (skin irritation).     . vitamin B-12 (CYANOCOBALAMIN) 1000 MCG tablet Take 1,000 mcg by mouth daily.     Marland Kitchen ZETIA 10 MG tablet Take 10 mg by mouth Daily.      No facility-administered medications prior to visit.     Allergies:   Amoxicillin, Bee venom, and Morphine and related   Social History   Socioeconomic History  . Marital status: Married    Spouse name: Not on file  . Number of children: 4  . Years of education: Not on file  . Highest education level: Not on file  Occupational History  . 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 Smoking status: Former Smoker    Packs/day: 0.50    Years: 15.00    Pack  years: 7.50    Types: Cigarettes    Quit date: 11/18/1962  Years since quitting: 57.0  . Smokeless tobacco: Never Used  Vaping Use  . Vaping Use: Never used  Substance and Sexual Activity  . Alcohol use: Yes    Alcohol/week: 2.0 standard drinks    Types: 2 Shots of liquor per week    Comment: daily  . Drug use: Never  . Sexual activity: Yes  Other Topics Concern  . Not on file  Social History Narrative   Lives with wife in [place].     Very active physically without significant limitations.    Exercises regularly   Social Determinants of Health   Financial Resource Strain:   . Difficulty of Paying Living Expenses: Not on file  Food Insecurity:   . Worried About Charity fundraiser in the Last Year: Not on file  . Ran Out of Food in the Last Year: Not on file  Transportation Needs:   . Lack of Transportation (Medical): Not on file  . Lack of Transportation (Non-Medical): Not on file  Physical Activity:   . Days of Exercise per Week: Not on file  . Minutes of Exercise per Session: Not on file  Stress:   . Feeling of Stress : Not on file  Social Connections:   . Frequency of Communication with Friends and Family: Not on file  . Frequency of Social Gatherings with Friends and Family: Not on file  . Attends Religious Services: Not on file  . Active Member of Clubs or Organizations: Not on file  . Attends Archivist Meetings: Not on file  . Marital Status: Not on file     Family History:  The patient's family history includes Dementia in his mother; Stroke in his brother.     ROS:   Please see the history of present illness.    ROS All other systems reviewed and are negative.   PHYSICAL EXAM:   VS:  BP 124/60   Pulse 67   Ht 6\' 1"  (1.854 m)   Wt 242 lb 3.2 oz (109.9 kg)   SpO2 97%   BMI 31.95 kg/m    GEN: Well nourished, well developed, in no acute distress HEENT: normal Neck: no JVD or masses Cardiac: RRR; soft flow murmur @ RUSB. No rubs, or  gallops,no edema  Respiratory:  clear to auscultation bilaterally, normal work of breathing GI: soft, nontender, nondistended, + BS MS: no deformity or atrophy Skin: warm and dry, no rash.  Groin site and subclavian site clear without hematoma. There is mild ecchymosis at right groin site.  Neuro:  Alert and Oriented x 3, Strength and sensation are intact Psych: euthymic mood, full affect   Wt Readings from Last 3 Encounters:  11/19/19 242 lb 3.2 oz (109.9 kg)  11/13/19 252 lb (114.3 kg)  11/11/19 246 lb 11.1 oz (111.9 kg)      Studies/Labs Reviewed:   EKG:  EKG is ordered today.  The ekg ordered today demonstrates sins with RBBB with LAFB  Recent Labs: 09/23/2019: TSH 1.950 11/06/2019: ALT 16; B Natriuretic Peptide 175.4 11/11/2019: BUN 25; Creatinine, Ser 1.26; Hemoglobin 9.6; Magnesium 2.2; Platelets 128; Potassium 4.4; Sodium 138   Lipid Panel    Component Value Date/Time   CHOL 84 03/03/2012 0751   TRIG 75 03/03/2012 0751   HDL 40 03/03/2012 0751   CHOLHDL 2.1 03/03/2012 0751   VLDL 15 03/03/2012 0751   LDLCALC 29 03/03/2012 0751    Additional studies/ records that were reviewed today include:  TAVR OPERATIVE NOTE  Date of Procedure:11/10/2019  Preoperative Diagnosis:Severe Aortic Stenosis   Postoperative Diagnosis:Same   Procedure:   Transcatheter Aortic Valve Replacement -Left subclavian artery approach Edwards Sapien 3 Ultra THV (size 49mm, model # 9750TFX, serial # [id number])  Co-Surgeons:Bryan Alveria Apley, MD and Lauree Chandler, MD   Anesthesiologist:A. Hodierne, MD  Echocardiographer:M. Croitoru, MD  Pre-operative Echo Findings: ? Severe aortic stenosis ? Normalleft ventricular systolic function  Post-operative Echo Findings: ? Trivialparavalvular leak ? Normalleft ventricular systolic function  _____________     Echo 11/11/19: IMPRESSIONS  1. Left ventricular ejection fraction, by estimation, is 60 to 65%. The  left ventricle has normal function. The left ventricle has no regional  wall motion abnormalities. There is mild concentric left ventricular  hypertrophy. Indeterminate diastolic  filling due to E-A fusion.  2. Right ventricular systolic function is normal. The right ventricular  size is normal. There is mildly elevated pulmonary artery systolic  pressure. The estimated right ventricular systolic pressure is 82.9 mmHg.  3. The mitral valve is normal in structure. Mild mitral valve  regurgitation. No evidence of mitral stenosis.  4. Trivial perivalvular leak is present. The aortic valve has been  repaired/replaced. Aortic valve regurgitation is trivial. No aortic  stenosis is present. There is a 26 mm Sapien prosthetic (TAVR) valve  present in the aortic position. Procedure Date:  11/10/2019. Aortic valve mean gradient measures 8.5 mmHg. Aortic valve  Vmax measures 2.00 m/s.  5. Aortic dilatation noted. There is mild dilatation of the ascending  aorta, measuring 40 mm.  6. The inferior vena cava is dilated in size with >50% respiratory  variability, suggesting right atrial pressure of 8 mmHg.   ASSESSMENT & PLAN:   Severe AS s/p TAVR:doing well. ECG without HAVB. Groin site and subclavian site stable without hematoma. SBE prophylaxis discussed; I have RX'd azithromycin due to an allergy to amoxicillin. He will be seen back for 1 month echo and visit by Dr. Einar [name].   PAFib/flutter: has converted back to sinus on amio. Feels better back in normal rhythm.  Continue amiodarone 200mg  BID. Continue Xarelto.   Bifascicular block: stable. Wearing Zio patch with no high risk alerts concerning for HAVB. No dizziness or syncope.   Chronic diastolic CHF: appear euvolemic aside from chronic LE edema. Continue on increased lasix 40mg  BID. Will check a BMET today   HTN: BP well  controlled. No changes made.   CAD s/p CABG: has chronic stable angina. Continue medical therapy.   Ascending thoracic aortic aneurysm: this has been stable over time.   Medication Adjustments/Labs and Tests Ordered: Current medicines are reviewed at length with the patient today.  Concerns regarding medicines are outlined above.  Medication changes, Labs and Tests ordered today are listed in the Patient Instructions below. Patient Instructions  Medication Instructions:  Your provider discussed the importance of taking an antibiotic prior to all dental visits to prevent damage to the heart valves from infection. You were given a prescription for AZITHROMYCIN 500 mg to take one hour prior to any dental appointment.  *If you need a refill on your cardiac medications before your next appointment, please call your pharmacy*  Lab Work: TODAY! BMET If you have labs (blood work) drawn today and your tests are completely normal, you will receive your results only by: Marland Kitchen MyChart Message (if you have MyChart) OR . A paper copy in the mail If you have any lab test that is abnormal or we need to change your treatment, we will call you to  review the results.  Follow-Up: Please keep your appointments as scheduled!    Signed, [name] Form, PA-C  11/19/2019 2:49 PM    [facility] [address], [place], [place]  [address] Phone: [phone]; Fax: [phone]

## 2019-11-18 ENCOUNTER — Telehealth (HOSPITAL_COMMUNITY): Payer: Self-pay

## 2019-11-18 NOTE — Telephone Encounter (Signed)
Called patient to see if he is interested in the Cardiac Rehab Program. Patient expressed interest. Explained scheduling process and went over insurance, patient verbalized understanding. Will contact patient for scheduling once f/u has been completed.  °

## 2019-11-18 NOTE — Telephone Encounter (Signed)
Pt insurance is active and benefits verified through [employer]. Co-pay $0.00, DED $0.00/$0.00 met, out of pocket $3,500.00/$1,327.21 met, co-insurance 0%. No pre-authorization required. Passport, 11/18/19 @ 2:23PM, REF#[id number]  Will contact patient to see if he is interested in the Cardiac Rehab Program. If interested, patient will need to complete follow up appt. Once completed, patient will be contacted for scheduling upon review by the RN Navigator. 

## 2019-11-19 ENCOUNTER — Other Ambulatory Visit: Payer: Self-pay

## 2019-11-19 ENCOUNTER — Encounter: Payer: Self-pay | Admitting: Physician Assistant

## 2019-11-19 ENCOUNTER — Ambulatory Visit: Payer: Medicare Other | Admitting: Physician Assistant

## 2019-11-19 VITALS — BP 124/60 | HR 67 | Ht 73.0 in | Wt 242.2 lb

## 2019-11-19 DIAGNOSIS — Z951 Presence of aortocoronary bypass graft: Secondary | ICD-10-CM

## 2019-11-19 DIAGNOSIS — I10 Essential (primary) hypertension: Secondary | ICD-10-CM

## 2019-11-19 DIAGNOSIS — I452 Bifascicular block: Secondary | ICD-10-CM | POA: Diagnosis not present

## 2019-11-19 DIAGNOSIS — I480 Paroxysmal atrial fibrillation: Secondary | ICD-10-CM | POA: Diagnosis not present

## 2019-11-19 DIAGNOSIS — I712 Thoracic aortic aneurysm, without rupture: Secondary | ICD-10-CM

## 2019-11-19 DIAGNOSIS — Z952 Presence of prosthetic heart valve: Secondary | ICD-10-CM

## 2019-11-19 DIAGNOSIS — I7121 Aneurysm of the ascending aorta, without rupture: Secondary | ICD-10-CM

## 2019-11-19 DIAGNOSIS — I5032 Chronic diastolic (congestive) heart failure: Secondary | ICD-10-CM

## 2019-11-19 MED ORDER — AZITHROMYCIN 500 MG PO TABS: ORAL_TABLET | ORAL | 5 refills | Status: DC

## 2019-11-19 NOTE — Addendum Note (Signed)
Addended by: Eileen Stanford on: 11/19/2019 02:51 PM   Modules accepted: Level of Service

## 2019-11-19 NOTE — Patient Instructions (Signed)
Medication Instructions:  Your provider discussed the importance of taking an antibiotic prior to all dental visits to prevent damage to the heart valves from infection. You were given a prescription for AZITHROMYCIN 500 mg to take one hour prior to any dental appointment.  *If you need a refill on your cardiac medications before your next appointment, please call your pharmacy*  Lab Work: TODAY! BMET If you have labs (blood work) drawn today and your tests are completely normal, you will receive your results only by: Marland Kitchen MyChart Message (if you have MyChart) OR . A paper copy in the mail If you have any lab test that is abnormal or we need to change your treatment, we will call you to review the results.  Follow-Up: Please keep your appointments as scheduled!

## 2019-11-20 LAB — BASIC METABOLIC PANEL
BUN/Creatinine Ratio: 19 (ref 10–24)
BUN: 29 mg/dL (ref 10–36)
CO2: 25 mmol/L (ref 20–29)
Calcium: 9.7 mg/dL (ref 8.6–10.2)
Chloride: 100 mmol/L (ref 96–106)
Creatinine, Ser: 1.52 mg/dL — ABNORMAL HIGH (ref 0.76–1.27)
GFR calc Af Amer: 46 mL/min/{1.73_m2} — ABNORMAL LOW (ref >59)
GFR calc non Af Amer: 39 mL/min/{1.73_m2} — ABNORMAL LOW (ref >59)
Glucose: 105 mg/dL — ABNORMAL HIGH (ref 65–99)
Potassium: 4.3 mmol/L (ref 3.5–5.2)
Sodium: 139 mmol/L (ref 134–144)

## 2019-11-24 ENCOUNTER — Other Ambulatory Visit: Payer: Medicare Other

## 2019-11-24 ENCOUNTER — Ambulatory Visit: Payer: Medicare Other | Admitting: Cardiology

## 2019-11-25 ENCOUNTER — Telehealth (HOSPITAL_COMMUNITY): Payer: Self-pay

## 2019-11-25 NOTE — Telephone Encounter (Signed)
Called pt to see if he was interested in scheduling cardiac rehab pt stated that his insurance told him that they only cover cardiac rehab once in a lifetime, and that he already done cardiac rehab and he had to pay out of pocket the last time I advised pt that I will call his insurance next week to verify.

## 2019-11-30 ENCOUNTER — Other Ambulatory Visit: Payer: Self-pay

## 2019-11-30 ENCOUNTER — Ambulatory Visit: Payer: Medicare Other | Admitting: Student

## 2019-11-30 DIAGNOSIS — M2550 Pain in unspecified joint: Secondary | ICD-10-CM | POA: Diagnosis not present

## 2019-11-30 DIAGNOSIS — R700 Elevated erythrocyte sedimentation rate: Secondary | ICD-10-CM | POA: Diagnosis not present

## 2019-11-30 DIAGNOSIS — E785 Hyperlipidemia, unspecified: Secondary | ICD-10-CM | POA: Diagnosis not present

## 2019-11-30 DIAGNOSIS — M353 Polymyalgia rheumatica: Secondary | ICD-10-CM | POA: Diagnosis not present

## 2019-11-30 DIAGNOSIS — I480 Paroxysmal atrial fibrillation: Secondary | ICD-10-CM

## 2019-11-30 MED ORDER — AMIODARONE HCL 200 MG PO TABS: ORAL_TABLET | ORAL | 0 refills | Status: DC

## 2019-11-30 MED ORDER — ISOSORBIDE MONONITRATE ER 60 MG PO TB24
ORAL_TABLET | ORAL | 1 refills | Status: DC
Start: 2019-11-30 — End: 2020-05-23

## 2019-12-03 ENCOUNTER — Ambulatory Visit: Payer: Medicare Other | Admitting: Student

## 2019-12-03 NOTE — Telephone Encounter (Signed)
Pt insurance is active and benefits verified through [facility]. Co-pay $0.00, DED $0.00/$0.00 met, out of pocket $3,500.00/$1,479.02 met, co-insurance 0%. No pre-authorization required. [employer], 12/03/19 @ 11:30AM, REF#[id number]  Per Vee with [employer] pt does have CR covering, he benefits run year by year, not once in a lifetime.

## 2019-12-07 ENCOUNTER — Other Ambulatory Visit: Payer: Self-pay

## 2019-12-07 ENCOUNTER — Ambulatory Visit (INDEPENDENT_AMBULATORY_CARE_PROVIDER_SITE_OTHER): Payer: Medicare Other

## 2019-12-07 DIAGNOSIS — T63441D Toxic effect of venom of bees, accidental (unintentional), subsequent encounter: Secondary | ICD-10-CM | POA: Diagnosis not present

## 2019-12-08 ENCOUNTER — Ambulatory Visit: Payer: Medicare Other

## 2019-12-08 ENCOUNTER — Other Ambulatory Visit: Payer: Self-pay

## 2019-12-08 DIAGNOSIS — I5033 Acute on chronic diastolic (congestive) heart failure: Secondary | ICD-10-CM

## 2019-12-08 DIAGNOSIS — I359 Nonrheumatic aortic valve disorder, unspecified: Secondary | ICD-10-CM

## 2019-12-08 DIAGNOSIS — R0609 Other forms of dyspnea: Secondary | ICD-10-CM | POA: Diagnosis not present

## 2019-12-08 DIAGNOSIS — R0600 Dyspnea, unspecified: Secondary | ICD-10-CM

## 2019-12-08 DIAGNOSIS — Z952 Presence of prosthetic heart valve: Secondary | ICD-10-CM

## 2019-12-08 MED ORDER — FUROSEMIDE 40 MG PO TABS: 40.0000 mg | ORAL_TABLET | Freq: Two times a day (BID) | ORAL | 2 refills | Status: DC

## 2019-12-08 NOTE — Progress Notes (Signed)
Looks good

## 2019-12-09 DIAGNOSIS — I480 Paroxysmal atrial fibrillation: Secondary | ICD-10-CM | POA: Diagnosis not present

## 2019-12-09 DIAGNOSIS — N1831 Chronic kidney disease, stage 3a: Secondary | ICD-10-CM | POA: Diagnosis not present

## 2019-12-09 DIAGNOSIS — M545 Low back pain, unspecified: Secondary | ICD-10-CM | POA: Diagnosis not present

## 2019-12-09 DIAGNOSIS — I129 Hypertensive chronic kidney disease with stage 1 through stage 4 chronic kidney disease, or unspecified chronic kidney disease: Secondary | ICD-10-CM | POA: Diagnosis not present

## 2019-12-10 ENCOUNTER — Other Ambulatory Visit: Payer: Self-pay

## 2019-12-10 DIAGNOSIS — I5033 Acute on chronic diastolic (congestive) heart failure: Secondary | ICD-10-CM

## 2019-12-10 MED ORDER — FUROSEMIDE 40 MG PO TABS: 40.0000 mg | ORAL_TABLET | Freq: Two times a day (BID) | ORAL | 3 refills | Status: DC

## 2019-12-14 DIAGNOSIS — L905 Scar conditions and fibrosis of skin: Secondary | ICD-10-CM | POA: Diagnosis not present

## 2019-12-14 DIAGNOSIS — Z85828 Personal history of other malignant neoplasm of skin: Secondary | ICD-10-CM | POA: Diagnosis not present

## 2019-12-16 ENCOUNTER — Encounter: Payer: Self-pay | Admitting: Student

## 2019-12-16 ENCOUNTER — Other Ambulatory Visit: Payer: Self-pay

## 2019-12-16 ENCOUNTER — Ambulatory Visit: Payer: Medicare Other | Admitting: Student

## 2019-12-16 VITALS — BP 118/58 | HR 61 | Resp 16 | Ht 73.0 in | Wt 244.0 lb

## 2019-12-16 DIAGNOSIS — E7800 Pure hypercholesterolemia, unspecified: Secondary | ICD-10-CM

## 2019-12-16 DIAGNOSIS — I10 Essential (primary) hypertension: Secondary | ICD-10-CM

## 2019-12-16 DIAGNOSIS — I480 Paroxysmal atrial fibrillation: Secondary | ICD-10-CM

## 2019-12-16 DIAGNOSIS — I5032 Chronic diastolic (congestive) heart failure: Secondary | ICD-10-CM

## 2019-12-16 MED ORDER — AMIODARONE HCL 200 MG PO TABS: ORAL_TABLET | ORAL | 3 refills | Status: DC

## 2019-12-16 MED ORDER — EZETIMIBE 10 MG PO TABS: 10.0000 mg | ORAL_TABLET | Freq: Every day | ORAL | 3 refills | Status: DC

## 2019-12-16 MED ORDER — NITROGLYCERIN 0.4 MG SL SUBL: 0.4000 mg | SUBLINGUAL_TABLET | SUBLINGUAL | 3 refills | Status: DC | PRN

## 2019-12-16 MED ORDER — FUROSEMIDE 40 MG PO TABS: 40.0000 mg | ORAL_TABLET | Freq: Every day | ORAL | 3 refills | Status: DC

## 2019-12-16 NOTE — Progress Notes (Signed)
Primary Physician/Referring:  [name], MD  Patient ID: [name] [name], male    DOB: Jul 06, 1926, 86 y.o.   MRN: [id number]  Chief Complaint  Patient presents with  . Chest Pain  . Shortness of Breath  . Results    Echocardiogram  . Follow-up   HPI:    [name] [name]  is a 86 y.o. Caucasian male with vitamin B12 deficiency, mild dementia, hyperlipidemia, hypertension, CAD, chronic stable angina status post CABG x 3 and Maze and LAA amputation in 2014, paroxysmal atrial fibrillation, obstructive sleep apnea not currently on CPAP, moderate aortic stenosis with thoracic aortic aneurysm and dilation of ascending aorta and follows Dr. Lilly Cove (No correlation with TTE with regards to aneurysm), venous stasis edema. Patient underwent TAVR 11/10/2019, at which time he presented in atypical atrial flutter but negative chronotropic were avoided due to age, aortic valve disease and bradycardia. He spontaneously converted to sinus, but presented to our office in atrial fibrillation and acute decompensated heart failure on 11/13/2019.  Patient now reports for follow-up of heart failure and atrial fibrillation.  At last visit patient was started on amiodarone 20 mg twice daily given.  Metoprolol titrate for tachycardia.  Patient was in acute decompensated heart failure at last visit as well, therefore furosemide was increased from 40 mg once to twice daily.  Patient reports improvement of dyspnea as well as leg swelling from last visit.  Denies orthopnea, PND palpitations, dizziness, syncope, near syncope.  He has had no recurrence of feeling like his heart is racing.  He does report one episode of right-sided chest pain when he woke up this morning that lasted approximately 25 minutes.  He took 3 sublingual nitroglycerin tablets and pain subsided.  Pain was nonradiating and he had no associated symptoms.  Patient continues to tolerate anticoagulation with helping diathesis.  He has not taken as needed  metoprolol.  Past Medical History:  Diagnosis Date  . AAA (abdominal aortic aneurysm) ([place])   . Acute coronary syndrome ([place]) 02/29/2012  . Acute systolic heart failure ([place]) 03/03/2012  . Angina   . Arthritis   . Ascending aorta dilatation (HCC) 03/13/2012   Fusiform dilatation of the ascending thoracic aorta discovered during surgery, max diameter 4.2-4.3 cm  . Atrial fibrillation ([place]) 02/29/2012   Recurrent paroxysmal, new-onset  . BPH (benign prostatic hypertrophy)   . CAD (coronary artery disease)    Cath April 2012 40% left main, 50% LAD, 40% OM, occluded RCA with L-R collaterals   . CHF (congestive heart failure) ([place])   . Chronic venous insufficiency   . Dyspnea   . GERD (gastroesophageal reflux disease)   . Glaucoma   . Gout   . Headache    MIGRAINES in the past  . History of kidney stones   . Hyperlipidemia   . Hypertension   . MI (myocardial infarction) ([place])   . Obesity (BMI 30-39.9)   . Paroxysmal atrial fibrillation (HCC) 02/29/2012   Recurrent paroxysmal, new-onset   . PMR (polymyalgia rheumatica) (HCC)   . Pre-diabetes   . S/P CABG x 3 03/06/2012   LIMA to LAD, SVG to OM, SVG to RCA, EVH via right thigh  . S/P Maze operation for atrial fibrillation 03/06/2012   Complete bilateral lesions set using bipolar radiofrequency and cryothermy ablation with clipping of LA appendage  . 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   s/p TAVR with a 26 mm Edwards via the left subclavian by Drs Buena Irish and Cyndia Bent  .  Severe aortic stenosis 09/29/2019  . Sleep apnea    USES CPAP NIGHTLY   Past Surgical History:  Procedure Laterality Date  . CARDIAC CATHETERIZATION    . CATARACT EXTRACTION, BILATERAL     with lens implants  . CHOLECYSTECTOMY N/A 03/24/2012   Procedure: LAPAROSCOPIC CHOLECYSTECTOMY WITH INTRAOPERATIVE CHOLANGIOGRAM;  Surgeon: Zenovia Jarred, MD;  Location: [facility];  Service: General;  Laterality: N/A;  . CORONARY ARTERY BYPASS GRAFT N/A 03/06/2012    Procedure: CORONARY ARTERY BYPASS GRAFTING (CABG);  Surgeon: Rexene Alberts, MD;  Location: [facility];  Service: Open Heart Surgery;  Laterality: N/A;  . ENDOVEIN HARVEST OF GREATER SAPHENOUS VEIN Right 03/06/2012   Procedure: ENDOVEIN HARVEST OF GREATER SAPHENOUS VEIN;  Surgeon: Rexene Alberts, MD;  Location: [facility];  Service: Open Heart Surgery;  Laterality: Right;  . INTRAOPERATIVE TRANSESOPHAGEAL ECHOCARDIOGRAM N/A 03/06/2012   Procedure: INTRAOPERATIVE TRANSESOPHAGEAL ECHOCARDIOGRAM;  Surgeon: Rexene Alberts, MD;  Location: [facility];  Service: Open Heart Surgery;  Laterality: N/A;  . LEFT HEART CATH Right 03/02/2012   Procedure: LEFT HEART CATH;  Surgeon: Sherren Mocha, MD;  Location: [facility] CATH LAB;  Service: Cardiovascular;  Laterality: Right;  Marland Kitchen MAZE N/A 03/06/2012   Procedure: MAZE;  Surgeon: Rexene Alberts, MD;  Location: [facility] [place];  Service: Open Heart Surgery;  Laterality: N/A;  . PROSTATECTOMY     partial  . RIGHT/LEFT HEART CATH AND CORONARY/GRAFT ANGIOGRAPHY N/A 09/29/2019   Procedure: RIGHT/LEFT HEART CATH AND CORONARY/GRAFT ANGIOGRAPHY;  Surgeon: Adrian Prows, MD;  Location: [facility] CV LAB;  Service: Cardiovascular;  Laterality: N/A;  . TEE WITHOUT CARDIOVERSION N/A 11/10/2019   Procedure: TRANSESOPHAGEAL ECHOCARDIOGRAM (TEE);  Surgeon: Burnell Blanks, MD;  Location: [facility];  Service: Open Heart Surgery;  Laterality: N/A;  . TRANSCATHETER AORTIC VALVE REPLACEMENT, TRANSFEMORAL  11/10/2019   Family History  Problem Relation Age of Onset  . Dementia Mother   . Stroke Brother     Social History   Tobacco Use  . Smoking status: Former Smoker    Packs/day: 0.50    Years: 15.00    Pack years: 7.50    Types: Cigarettes    Quit date: 11/18/1962    Years since quitting: 57.1  . Smokeless tobacco: Never Used  Substance Use Topics  . Alcohol use: Yes    Alcohol/week: 2.0 standard drinks    Types: 2 Shots of liquor per week    Comment: daily   ROS  Review of Systems   Constitutional: Negative for malaise/fatigue and weight gain.  Cardiovascular: Positive for dyspnea on exertion and leg swelling (chronic, back to baseline compared to last visit). Negative for chest pain, claudication, irregular heartbeat, near-syncope, orthopnea, palpitations, paroxysmal nocturnal dyspnea and syncope.  Respiratory: Negative for shortness of breath.   Hematologic/Lymphatic: Does not bruise/bleed easily.  Musculoskeletal: Positive for arthritis (mild and stable), back pain, joint pain and myalgias (recent diagnosis of PMR).  Gastrointestinal: Negative for constipation and melena.  Neurological: Negative for dizziness and weakness.   Objective  Blood pressure (!) 118/58, pulse 61, resp. rate 16, height 6\' 1"  (1.854 m), weight 244 lb (110.7 kg), SpO2 95 %.  Vitals with BMI 12/16/2019 11/19/2019 11/13/2019  Height 6\' 1"  6\' 1"  6\' 1"   Weight 244 lbs 242 lbs 3 oz 252 lbs  BMI 32.2 81.85 63.14  Systolic 970 263 785  Diastolic 58 60 61  Pulse 61 67 81     Physical Exam Constitutional:      General: He is  not in acute distress.    Appearance: He is well-developed.  Cardiovascular:     Rate and Rhythm: Normal rate and regular rhythm.     Pulses:          Carotid pulses are 2+ on the right side and 2+ on the left side.      Radial pulses are 2+ on the right side and 2+ on the left side.       Femoral pulses are 2+ on the right side and 2+ on the left side.      Popliteal pulses are 2+ on the right side and 2+ on the left side.       Dorsalis pedis pulses are 0 on the right side and 1+ on the left side.       Posterior tibial pulses are 0 on the right side and 0 on the left side.     Heart sounds: Murmur heard.   Early systolic murmur is present with a grade of 2/6 at the upper right sternal border. S1 is normal, S2 is muffled.  No gallop.      Comments: Vascular examination reveals bilateral pigmented lower extremities with varicose veins.  1-2+ pitting edema. No JVD.   Pulmonary:     Effort: Pulmonary effort is normal.     Breath sounds: No rhonchi or rales.  Abdominal:     General: Bowel sounds are normal.     Palpations: Abdomen is soft.    Laboratory examination:   Recent Labs    09/23/19 1341 09/29/19 0759 10/09/19 1151 10/13/19 0838 11/06/19 0836 11/10/19 0914 11/10/19 1108 11/11/19 0416 11/19/19 1410  NA 140   < > 141   < > 138   < > 140 138 139  K 4.6   < > 4.1   < > 4.0   < > 3.8 4.4 4.3  CL 101  --  103   < > 106   < > 106 106 100  CO2 23  --  23   < > 21*  --   --  24 25  GLUCOSE 117*  --  115*   < > 161*   < > 148* 125* 105*  BUN 30  --  25   < > 27*   < > 25* 25* 29  CREATININE 1.45*  --  1.33*   < > 1.37*   < > 1.20 1.26* 1.52*  CALCIUM 9.8  --  10.0   < > 9.5  --   --  9.5 9.7  GFRNONAA 42*  --  46*   < > 49*  --   --  54* 39*  GFRAA 48*  --  54*  --   --   --   --   --  46*   < > = values in this interval not displayed.   CrCl cannot be calculated (Patient's most recent lab result is older than the maximum 21 days allowed.).  CMP Latest Ref Rng & Units 11/19/2019 11/11/2019 11/10/2019  Glucose 65 - 99 mg/dL 105(H) 125(H) 148(H)  BUN 10 - 36 mg/dL 29 25(H) 25(H)  Creatinine 0.76 - 1.27 mg/dL 1.52(H) 1.26(H) 1.20  Sodium 134 - 144 mmol/L 139 138 140  Potassium 3.5 - 5.2 mmol/L 4.3 4.4 3.8  Chloride 96 - 106 mmol/L 100 106 106  CO2 20 - 29 mmol/L 25 24 -  Calcium 8.6 - 10.2 mg/dL 9.7 9.5 -  Total Protein 6.5 - 8.1 g/dL - - -  Total Bilirubin 0.3 - 1.2 mg/dL - - -  Alkaline Phos 38 - 126 U/L - - -  AST 15 - 41 U/L - - -  ALT 0 - 44 U/L - - -   CBC Latest Ref Rng & Units 11/11/2019 11/10/2019 11/10/2019  WBC 4.0 - 10.5 K/uL 7.0 - -  Hemoglobin 13.0 - 17.0 g/dL 9.6(L) 10.5(L) 9.9(L)  Hematocrit 39.0 - 52.0 % 30.3(L) 31.0(L) 29.0(L)  Platelets 150 - 400 K/uL 128(L) - -   TSH Recent Labs    09/23/19 1341  TSH 1.950    External labs:   Cholesterol, total 115.000 m 01/19/2019 HDL 47 MG/DL 01/19/2019 LDL 51.000 mg  01/19/2019 Triglycerides 84.000 01/19/2019  A1C 5.600 % 01/19/2019; TSH 2.320 01/19/2019  Hemoglobin 10.900 g/ 02/06/2019; INR 1.900 09/11/2018 Platelets 182.000 09/11/2018  Creatinine, Serum 1.300 mg/ 02/06/2019 Potassium 4.100 09/11/2018 Magnesium N/D ALT (SGPT) 15.000 uni 01/19/2019  Medications and allergies   Allergies  Allergen Reactions  . Amoxicillin Other (See Comments)    Headache, debilitated  . Bee Venom Anaphylaxis  . Morphine And Related Other (See Comments)    hypotension    Current Outpatient Medications on File Prior to Visit  Medication Sig Dispense Refill  . acetaminophen (TYLENOL) 500 MG tablet Take 500 mg by mouth in the morning and at bedtime.    Marland Kitchen allopurinol (ZYLOPRIM) 300 MG tablet Take 150 mg by mouth Daily.    Marland Kitchen amLODipine (NORVASC) 5 MG tablet TAKE 1 TABLET BY MOUTH DAILY 90 tablet 3  . cholecalciferol (VITAMIN D3) 25 MCG (1000 UNIT) tablet Take 1,000 Units by mouth daily.     Marland Kitchen EPINEPHrine 0.3 mg/0.3 mL IJ SOAJ injection Inject 0.3 mLs (0.3 mg total) into the muscle as needed for anaphylaxis. 2 each 2  . fish oil-omega-3 fatty acids 1000 MG capsule Take 1 g by mouth daily.     Marland Kitchen galantamine (RAZADYNE ER) 8 MG 24 hr capsule Take 8 mg by mouth Daily.    . HONEY BEE VENOM IJ Inject 2 each as directed See admin instructions. Every eight weeks    . HYDROcodone-acetaminophen (NORCO/VICODIN) 5-325 MG tablet Take 1 tablet by mouth 2 (two) times daily as needed.    . isosorbide mononitrate (IMDUR) 60 MG 24 hr tablet TAKE 1 TABLET(60 MG) BY MOUTH DAILY AFTER SUPPER 90 tablet 1  . memantine (NAMENDA) 10 MG tablet Take 10 mg by mouth 2 (two) times daily.    . methocarbamol (ROBAXIN) 500 MG tablet Take 250-500 mg by mouth every 8 (eight) hours.    . metoprolol tartrate (LOPRESSOR) 25 MG tablet Take 1 tablet (25 mg total) by mouth 3 (three) times daily as needed. For rapid heart beat 60 tablet 2  . pantoprazole (PROTONIX) 40 MG tablet Take 40 mg by mouth every other day.   4   . potassium chloride (MICRO-K) 10 MEQ CR capsule TAKE 1 CAPSULE BY MOUTH DAILY WITH LASIX 90 capsule 1  . predniSONE (DELTASONE) 1 MG tablet Take 3 mg by mouth daily with breakfast.     . PRESCRIPTION MEDICATION Cpap    . Psyllium Husk POWD Take 1 Scoop by mouth daily.     . Rivaroxaban (XARELTO) 15 MG TABS tablet Take 15 mg by mouth daily with supper.    . rosuvastatin (CRESTOR) 10 MG tablet Take 10 mg by mouth daily.     Marland Kitchen telmisartan (MICARDIS) 40 MG tablet Take 20 mg by mouth daily.     Marland Kitchen triamcinolone cream (KENALOG)  0.1 % Apply 1 application topically as needed (skin irritation).     . vitamin B-12 (CYANOCOBALAMIN) 1000 MCG tablet Take 1,000 mcg by mouth daily.     Marland Kitchen azithromycin (ZITHROMAX) 500 MG tablet Take one tablet (500 mg) one hour prior to all dental visits. (Patient not taking: Reported on 12/16/2019) 3 tablet 5   No current facility-administered medications on file prior to visit.    Radiology:   CT angiogram of the chest 04/29/2017: Stable ascending thoracic aortic aneurysm measuring up to 4.3 cm. Recommend annual imaging followup by CTA or MRA. This recommendation follows 2010 ACCF/AHA/AATS/ACR/ASA/SCA/SCAI/SIR/STS/SVM Guidelines for the Diagnosis and Management of Patients with Thoracic Aortic Disease. Circulation. 2010; 121: e266-e369  Atherosclerotic changes thoracic aorta with irregular plaque/small ulceration thoracic-abdominal aortic junction similar to prior exam.  Atherosclerotic changes origin of great vessels most notable involving the left subclavian artery.  Atherosclerotic changes with narrowing superior mesenteric artery and right renal artery.  Superior aspect of abdominal aortic aneurysm incompletely assessed on the present exam.  Post CABG.  Prominent coronary artery calcification.  Aortic Atherosclerosis (ICD10-I70.0).  Coronary CTA 10/13/2019:  1. Tri- leaflet AV with annular area 540 mm2 upper limit for 26 mm Sapien 3 valve Despite  large sinuses commissural calcium may suggest smaller valve better than 29 mm valve  2. Optimum angiographic angle for deployment LAO 16 Caudal 23 degrees  3.  Moderate aortic root dilatation 4.4 cm  4. Coronary arteries sufficient height above annulus for deployment with patent SVG to RCA/OM1 and patent LIMA to LAD  5.  LAA has been clipped with no contrast flow into the appendage  CTA Chest/abd/pelvis 10/13/2019:  Vascular/Lymphatic: Aortic atherosclerosis, with vascular findings and measurements pertinent to potential TAVR procedure, as detailed below. In addition, there is multifocal aneurysmal dilatation and short segment dissections of both the abdominal aorta and the common iliac arteries bilaterally. Multifocal fusiform aneurysmal dilatation of the infrarenal abdominal aorta measuring up to 3.8 x 3.1 cm proximally and 4.1 x 3.0 cm more distally, with multiple short segment dissection flaps within these regions. Aneurysmal dilatation of the common iliac arteries measuring 1.9 cm in diameter on the right and 1.6 cm in diameter on the left. No lymphadenopathy noted in the abdomen or pelvis.  IMPRESSION: 1. Vascular findings and measurements pertinent to potential TAVR procedure, as detailed above. Please note the presence of multiple aneurysms and short-segment dissections involving the infrarenal abdominal aorta as well as the common iliac arteries bilaterally. 2. Severe thickening calcification of the aortic valve, compatible with the reported clinical history of severe aortic stenosis. 3. Aortic atherosclerosis, in addition to left main and 3 vessel coronary artery disease. Status post median sternotomy for CABG including LIMA to the LAD. 4. Colonic diverticulosis without evidence of acute diverticulitis at this time. 5. Additional incidental findings, as above.  Cardiac Studies:   Nuclear stress test   12-04-2017: 1. Lexiscan stress test with low level exercise  was performed. Exercise capacity was not assessed. Stress symptoms included dyspnea. Peak effect blood pressure was 114/66 mmHg. The resting and stress electrocardiogram demonstrated normal sinus rhythm, RBBB, no resting arrhythmias and normal rest repolarization. Stress EKG is non diagnostic for ischemia as it is a pharmacologic stress. 2. The overall quality of the study is good. Left ventricular cavity is noted to be mildly enlarged on the rest and stress studies. Gated SPECT imaging demonstrated mild hypokinesis of the basal inferolateral and mid inferolateral myocardial wall(s). The left ventricular ejection fraction was calculated or visually  estimated to be 69%. Medium sized, medium intensity, fixed perfusion defect in mid to basal inferolateral myocardium suggests old LCx territory infarct with no periinfarct ischemia. 3. Low risk study.   ABI 01/30/2019: Right: Resting right ankle-brachial index indicates moderate right lower extremity arterial disease. The right toe-brachial index is abnormal.  Left: Resting left ankle-brachial index indicates mild left lower extremity arterial disease. The left toe-brachial index is abnormal.   Right and left heart catheterization 09/29/2019: Right heart catheterization RA 8/8, mean 6 mmHg.  RV 34/0, EDP 8 mmHg.;  PA 31/11, mean 18 mmHg.  PA saturation 62%.;  PW 13/25, mean 11 mmHg.  Aortic saturation 100%. Cardiac output 5.57, cardiac index 2.38, normal.  Normal SVR and PVR. LV: 1/4, EDP 14 mmHg.  Peak to peak aortic valve gradient was 41 with a mean gradient of 33.3 with a valve area of 1.09 cm.  Left heart catheterization Left main is calcific 50% to 60% stenosis distally and involves ostium of the large circumflex with a ostial 80% stenosis.  Gives origin to large OM1, OM 2. SVG to OM1 is widely patent and retrogradely fills the entire circumflex.  There is a 70 to 75% stenosis proximal to the graft insertion. LAD: Gives origin to large D1 and D2,  mild diffuse disease, subtotally occluded to occluded in the midsegment. LIMA to LAD: Widely patent. RCA: Dominant and occluded in the proximal segment. SVG to RCA: Widely patent.  Impression: Moderate to severe aortic stenosis, symptomatic.  Patient will benefit from aortic valve replacement and consideration for TAVR.  110 mL contrast utilized, fluoroscopy time 42.8 minutes.  TAVR 11/10/2019:  TRANSCATHETER AORTIC VALVE REPLACEMENT, LEFT SUBCLAVIAN USING EDWARDS SAPIEN 3 ULTRA 26 MM THV AORTIC VALVE.  Echocardiogram 03/05/2019:  Mildly depressed LV systolic function with visual EF 45-50%. Mild left  ventricular hypertrophy. Mild global hypokinesis. No obvious regional wall  motion abnormalities. Left ventricle cavity is normal in size. Elevated  LAP. Calculated EF 46%.  Trileaflet aortic valve. Moderate aortic stenosis. Mild (Grade I) aortic  regurgitation. Thickened and calcified aortic valve leaflets with reduced cusp separation.  Peak velocity 3.109m/s, mean gradient 48mmHg, Dimensional  index 0.24, AVA 1.0cm2, findings suggestive of moderate to severe aortic  stenosis.  Mild (Grade I) mitral regurgitation.  Mild tricuspid regurgitation.  Mild pulmonic regurgitation.  IVC is dilated with a respiratory response of >50%.  Prior study dated 11/2017: LVEF 56%, mild AR, mild AS (PG 53mmHG, MG  58mmHG, AVA 1.26 cm) mild pulmonary hypertension RVSP 68mmHG, remaining valvular heart disease remains stable in comparison.  Cardiac Monitor 11/11/2019 - 12/08/2019:  Sinus rhythm with episodes of atrial flutter vs atrial tachycardia Premature atrial contractions No evidence of high grade AV block or pauses.   PCV ECHOCARDIOGRAM COMPLETE 96/04/5407 Normal LV systolic function with visual EF 60-65%. Left ventricle cavity is normal in size. Mild left ventricular hypertrophy. Normal global wall motion. Doppler evidence of grade III (restrictive) diastolic dysfunction. Left atrial cavity is mildly  dilated. Bioprosthetic aortic valve (60mm Sapien valve) well seated, trivial perivalvular regurgitation.  No evidence of aortic stenosis. No aortic valve regurgitation. Mild (Grade I) mitral regurgitation. Mild tricuspid regurgitation. No evidence of pulmonary hypertension. IVC is dilated with a respiratory response of >50%. Compared to prior study dated 11/11/2019 no significant change.  EKG   EKG 12/16/2019: Sinus bradycardia at a rate of 58 bpm.  Left axis, left anterior fascicular block.  Right bundle branch block.  Bifascicular block.  Compared to EKG 11/13/2019, previous nonspecific T  wave abnormality of inferior leads not prominent.  EKG 11/13/2019 atrial fibrillation with controlled ventricular response at the rate of 76 bpm, left axis deviation, left anterior fascicular block.  Right bundle branch block.  Nonspecific T wave flattening inferior leads.  Normal eQT interval.   Assessment     ICD-10-CM   1. Paroxysmal atrial fibrillation (HCC)  I48.0 EKG 12-Lead    amiodarone (PACERONE) 200 MG tablet  2. Chronic diastolic CHF (congestive heart failure) (HCC)  I50.32 furosemide (LASIX) 40 MG tablet    nitroGLYCERIN (NITROSTAT) 0.4 MG SL tablet    Brain natriuretic peptide  3. Hypercholesteremia  E78.00 ezetimibe (ZETIA) 10 MG tablet  4. Essential hypertension  R42 Basic metabolic panel   Meds ordered this encounter  Medications  . furosemide (LASIX) 40 MG tablet    Sig: Take 1 tablet (40 mg total) by mouth daily.    Dispense:  90 tablet    Refill:  3  . nitroGLYCERIN (NITROSTAT) 0.4 MG SL tablet    Sig: Place 1 tablet (0.4 mg total) under the tongue every 5 (five) minutes x 3 doses as needed for chest pain.    Dispense:  30 tablet    Refill:  3  . ezetimibe (ZETIA) 10 MG tablet    Sig: Take 1 tablet (10 mg total) by mouth daily.    Dispense:  90 tablet    Refill:  3  . amiodarone (PACERONE) 200 MG tablet    Sig: TAKE 1 TABLET(200 MG) BY MOUTH DAILY    Dispense:  90 tablet     Refill:  3    **Patient requests 90 days supply**    Medications Discontinued During This Encounter  Medication Reason  . ZETIA 10 MG tablet Reorder  . NITROSTAT 0.4 MG SL tablet Reorder  . amiodarone (PACERONE) 200 MG tablet   . furosemide (LASIX) 40 MG tablet      This patients CHA2DS2-VASc Score 5 (CHF, HTN, MI, Age) and yearly risk of stroke 7.2%.   Recommendations:   MERRITT MCCRAVY  is a  86 y.o. Caucasian male with vitamin B12 deficiency, mild dementia, hyperlipidemia, hypertension, CAD, chronic stable angina status post CABG x 3 and Maze and LAA amputation in 2014, paroxysmal atrial fibrillation, obstructive sleep apnea not currently on CPAP, moderate aortic stenosis with thoracic aortic aneurysm and dilation of ascending aorta and follows Dr. Lilly Cove (No correlation with TTE with regards to aneurysm), venous stasis edema. Patient underwent TAVR 11/10/2019, at which time he presented in atypical atrial flutter but negative chronotropic were avoided due to age, aortic valve disease and bradycardia. He spontaneously converted to sinus, but presented to our office in atrial fibrillation with RVR and acute decompensated heart failure on 11/13/2019.  Patient presents today for 1 month follow-up of atrial fibrillation and heart failure.  Patient has had no recurrence of heart racing symptoms and reports improvement of dyspnea and leg swelling.  Presently there are no clinical signs of acute decompensated heart failure.  EKG today revealed sinus bradycardia with bifascicular block.  Patient continues to tolerate Xarelto without bleeding diathesis, will continue this.  Will reduce amiodarone from 200 mg twice daily to 200 mg once daily.  Also as there are no clinical signs of acute decompensated heart failure today recommend patient switch back to furosemide 40 mg once daily, with as needed additional dosing for increased leg swelling, dyspnea, orthopnea.  Patient verbalized understanding  agreement.  Repeat BMP did indicate slight increase in creatinine with twice  daily furosemide dosing.  We will repeat BMP to monitor. Will also obtain BNP at this time.   Counseled patient in regards to chronic stable angina, particularly episode this morning. S/L NTG was again prescribed and explained how to and when to use it and to notify us if there is change in frequency of use. Interaction with cialis-like agents was discussed.  Also discussed and reviewed echo findings showing good TAVR results. Lipids and blood pressure are well controlled. Again discussed importance of heart healthy diet compliance.   He will need SBE prophylaxis, which has been prescribed by Quentin Cornwall, PA-C with [facility].  In view of patient's allergy to amoxicillin she has sent for azithromycin.  Encourage patient to follow-up with his PCP regarding back pain.  Follow up in 3 months for atrial fibrillation and heart failure.  Patient was seen in collaboration with Dr. Einar Gip. He also reviewed patient's chart and Dr. Einar Gip is in agreement of the plan.    Alethia Berthold, PA-C 12/16/2019, 2:23 PM Office: [phone]

## 2019-12-17 ENCOUNTER — Other Ambulatory Visit: Payer: Self-pay

## 2019-12-17 DIAGNOSIS — E7800 Pure hypercholesterolemia, unspecified: Secondary | ICD-10-CM

## 2019-12-17 MED ORDER — EZETIMIBE 10 MG PO TABS: 10.0000 mg | ORAL_TABLET | Freq: Every day | ORAL | 6 refills | Status: DC

## 2019-12-18 DIAGNOSIS — I10 Essential (primary) hypertension: Secondary | ICD-10-CM | POA: Diagnosis not present

## 2019-12-18 DIAGNOSIS — I5033 Acute on chronic diastolic (congestive) heart failure: Secondary | ICD-10-CM | POA: Diagnosis not present

## 2019-12-18 DIAGNOSIS — I5032 Chronic diastolic (congestive) heart failure: Secondary | ICD-10-CM | POA: Diagnosis not present

## 2019-12-19 LAB — BASIC METABOLIC PANEL
BUN/Creatinine Ratio: 12 (ref 10–24)
BUN: 21 mg/dL (ref 10–36)
CO2: 25 mmol/L (ref 20–29)
Calcium: 9.9 mg/dL (ref 8.6–10.2)
Chloride: 100 mmol/L (ref 96–106)
Creatinine, Ser: 1.77 mg/dL — ABNORMAL HIGH (ref 0.76–1.27)
GFR calc Af Amer: 38 mL/min/{1.73_m2} — ABNORMAL LOW (ref >59)
GFR calc non Af Amer: 33 mL/min/{1.73_m2} — ABNORMAL LOW (ref >59)
Glucose: 119 mg/dL — ABNORMAL HIGH (ref 65–99)
Potassium: 4.1 mmol/L (ref 3.5–5.2)
Sodium: 139 mmol/L (ref 134–144)

## 2019-12-19 LAB — BRAIN NATRIURETIC PEPTIDE: BNP: 87.9 pg/mL (ref 0.0–100.0)

## 2019-12-21 NOTE — Progress Notes (Signed)
Spoke to patient he is aware of how he needs to be taking med

## 2019-12-21 NOTE — Progress Notes (Signed)
Please inform patient his kidney function is still reduced. Confirm that he is cutting his telmesartan in half (taking 20 mg per day). If he is not, please instruct him to do so. If he is cutting it in half tell him to hold it, and schedule him for 2 week follow up with me.   Let me know.

## 2020-01-08 ENCOUNTER — Ambulatory Visit
Admission: RE | Admit: 2020-01-08 | Discharge: 2020-01-08 | Disposition: A | Discharge status: Home / Self Care | Payer: Medicare Other | Source: Ambulatory Visit | Attending: Internal Medicine | Admitting: Internal Medicine

## 2020-01-08 ENCOUNTER — Other Ambulatory Visit: Payer: Self-pay

## 2020-01-08 DIAGNOSIS — M545 Low back pain, unspecified: Secondary | ICD-10-CM | POA: Diagnosis not present

## 2020-01-08 DIAGNOSIS — M48061 Spinal stenosis, lumbar region without neurogenic claudication: Secondary | ICD-10-CM | POA: Diagnosis not present

## 2020-01-08 DIAGNOSIS — M5136 Other intervertebral disc degeneration, lumbar region: Secondary | ICD-10-CM

## 2020-01-21 DIAGNOSIS — M5459 Other low back pain: Secondary | ICD-10-CM | POA: Diagnosis not present

## 2020-01-25 DIAGNOSIS — L570 Actinic keratosis: Secondary | ICD-10-CM | POA: Diagnosis not present

## 2020-01-25 DIAGNOSIS — L738 Other specified follicular disorders: Secondary | ICD-10-CM | POA: Diagnosis not present

## 2020-01-25 DIAGNOSIS — L819 Disorder of pigmentation, unspecified: Secondary | ICD-10-CM | POA: Diagnosis not present

## 2020-01-28 DIAGNOSIS — M48062 Spinal stenosis, lumbar region with neurogenic claudication: Secondary | ICD-10-CM | POA: Diagnosis not present

## 2020-01-28 DIAGNOSIS — M5136 Other intervertebral disc degeneration, lumbar region: Secondary | ICD-10-CM | POA: Diagnosis not present

## 2020-01-28 DIAGNOSIS — M4856XA Collapsed vertebra, not elsewhere classified, lumbar region, initial encounter for fracture: Secondary | ICD-10-CM | POA: Diagnosis not present

## 2020-02-01 ENCOUNTER — Ambulatory Visit: Payer: Self-pay

## 2020-02-02 DIAGNOSIS — M4856XD Collapsed vertebra, not elsewhere classified, lumbar region, subsequent encounter for fracture with routine healing: Secondary | ICD-10-CM | POA: Diagnosis not present

## 2020-02-02 DIAGNOSIS — M6281 Muscle weakness (generalized): Secondary | ICD-10-CM | POA: Diagnosis not present

## 2020-02-03 ENCOUNTER — Ambulatory Visit: Payer: Medicare Other | Admitting: Physical Therapy

## 2020-02-05 ENCOUNTER — Other Ambulatory Visit: Payer: Self-pay | Admitting: Cardiology

## 2020-02-07 ENCOUNTER — Other Ambulatory Visit: Payer: Self-pay | Admitting: Cardiology

## 2020-02-08 ENCOUNTER — Ambulatory Visit: Payer: Self-pay

## 2020-02-08 ENCOUNTER — Telehealth: Payer: Self-pay | Admitting: *Deleted

## 2020-02-08 NOTE — Telephone Encounter (Signed)
Patient's wife called stating that her husband has a broken vertebrae and he is currently disabled and pending surgery. She wanted to cancel his venom injection appointment for today. She asked if it will interfere with his injections since he has been receiving them since the 80's to wait a few weeks. I advised that he should be fine especially considering that it is winter time right now and that stinging insects are dormant at this time. I advised that when he is ready to come in to call and we will speak to the doctor and see what they would like to do for the patient. Patient's wife verbalized understanding.

## 2020-02-12 DIAGNOSIS — M545 Low back pain, unspecified: Secondary | ICD-10-CM | POA: Diagnosis not present

## 2020-02-13 DIAGNOSIS — M353 Polymyalgia rheumatica: Secondary | ICD-10-CM | POA: Diagnosis not present

## 2020-02-13 DIAGNOSIS — G8929 Other chronic pain: Secondary | ICD-10-CM | POA: Diagnosis not present

## 2020-02-13 DIAGNOSIS — M47816 Spondylosis without myelopathy or radiculopathy, lumbar region: Secondary | ICD-10-CM | POA: Diagnosis not present

## 2020-02-13 DIAGNOSIS — M48061 Spinal stenosis, lumbar region without neurogenic claudication: Secondary | ICD-10-CM | POA: Diagnosis not present

## 2020-02-13 DIAGNOSIS — H9193 Unspecified hearing loss, bilateral: Secondary | ICD-10-CM | POA: Diagnosis not present

## 2020-02-13 DIAGNOSIS — M1991 Primary osteoarthritis, unspecified site: Secondary | ICD-10-CM | POA: Diagnosis not present

## 2020-02-13 DIAGNOSIS — R7303 Prediabetes: Secondary | ICD-10-CM | POA: Diagnosis not present

## 2020-02-13 DIAGNOSIS — M8008XD Age-related osteoporosis with current pathological fracture, vertebra(e), subsequent encounter for fracture with routine healing: Secondary | ICD-10-CM | POA: Diagnosis not present

## 2020-02-13 DIAGNOSIS — I509 Heart failure, unspecified: Secondary | ICD-10-CM | POA: Diagnosis not present

## 2020-02-13 DIAGNOSIS — G4733 Obstructive sleep apnea (adult) (pediatric): Secondary | ICD-10-CM | POA: Diagnosis not present

## 2020-02-13 DIAGNOSIS — E7800 Pure hypercholesterolemia, unspecified: Secondary | ICD-10-CM | POA: Diagnosis not present

## 2020-02-13 DIAGNOSIS — F32A Depression, unspecified: Secondary | ICD-10-CM | POA: Diagnosis not present

## 2020-02-13 DIAGNOSIS — H409 Unspecified glaucoma: Secondary | ICD-10-CM | POA: Diagnosis not present

## 2020-02-13 DIAGNOSIS — I2510 Atherosclerotic heart disease of native coronary artery without angina pectoris: Secondary | ICD-10-CM | POA: Diagnosis not present

## 2020-02-13 DIAGNOSIS — I110 Hypertensive heart disease with heart failure: Secondary | ICD-10-CM | POA: Diagnosis not present

## 2020-02-13 DIAGNOSIS — M5136 Other intervertebral disc degeneration, lumbar region: Secondary | ICD-10-CM | POA: Diagnosis not present

## 2020-02-15 DIAGNOSIS — M2550 Pain in unspecified joint: Secondary | ICD-10-CM | POA: Diagnosis not present

## 2020-02-15 DIAGNOSIS — M545 Low back pain, unspecified: Secondary | ICD-10-CM | POA: Diagnosis not present

## 2020-02-15 DIAGNOSIS — H538 Other visual disturbances: Secondary | ICD-10-CM | POA: Diagnosis not present

## 2020-02-15 DIAGNOSIS — M353 Polymyalgia rheumatica: Secondary | ICD-10-CM | POA: Diagnosis not present

## 2020-02-17 ENCOUNTER — Other Ambulatory Visit (HOSPITAL_COMMUNITY): Payer: Self-pay | Admitting: Interventional Radiology

## 2020-02-17 ENCOUNTER — Ambulatory Visit
Admission: RE | Admit: 2020-02-17 | Discharge: 2020-02-17 | Disposition: A | Discharge status: Home / Self Care | Payer: Self-pay | Source: Ambulatory Visit | Attending: Interventional Radiology | Admitting: Interventional Radiology

## 2020-02-17 ENCOUNTER — Telehealth (HOSPITAL_COMMUNITY): Payer: Self-pay

## 2020-02-17 DIAGNOSIS — R52 Pain, unspecified: Secondary | ICD-10-CM

## 2020-02-17 NOTE — Telephone Encounter (Signed)
Ok per [employer] for L1 KP/VP

## 2020-02-19 ENCOUNTER — Other Ambulatory Visit (HOSPITAL_COMMUNITY): Payer: Self-pay | Admitting: Interventional Radiology

## 2020-02-19 ENCOUNTER — Telehealth: Payer: Self-pay

## 2020-02-19 DIAGNOSIS — S32010A Wedge compression fracture of first lumbar vertebra, initial encounter for closed fracture: Secondary | ICD-10-CM

## 2020-02-19 NOTE — Telephone Encounter (Signed)
Next time ask them to send a patient telephone encounter or an email to this effect so we can directly answer.  Sure can hold Xarelto 48 hours and restart same day or following day. JG

## 2020-02-19 NOTE — Telephone Encounter (Signed)
Called and spoke with [name] [name] cone regarding pts medication. They voiced understanding.

## 2020-02-19 NOTE — Telephone Encounter (Signed)
[facility] called regarding an upcoming procedure the pt is going to have. The patient will be undergoing a kythoplasty in about a week and one of the nurses would like to know if it is okay to hold his xerelto the day before the procedure. Caryl [phone])

## 2020-02-22 ENCOUNTER — Other Ambulatory Visit: Payer: Self-pay | Admitting: Student

## 2020-02-23 ENCOUNTER — Encounter (HOSPITAL_COMMUNITY): Payer: Self-pay

## 2020-02-23 ENCOUNTER — Other Ambulatory Visit: Payer: Self-pay

## 2020-02-23 ENCOUNTER — Ambulatory Visit (HOSPITAL_COMMUNITY)
Admission: RE | Admit: 2020-02-23 | Discharge: 2020-02-23 | Disposition: A | Discharge status: Home / Self Care | Payer: Medicare Other | Source: Ambulatory Visit | Attending: Interventional Radiology | Admitting: Interventional Radiology

## 2020-02-23 DIAGNOSIS — S32000S Wedge compression fracture of unspecified lumbar vertebra, sequela: Secondary | ICD-10-CM | POA: Diagnosis not present

## 2020-02-23 DIAGNOSIS — Z79899 Other long term (current) drug therapy: Secondary | ICD-10-CM | POA: Diagnosis not present

## 2020-02-23 DIAGNOSIS — S32010A Wedge compression fracture of first lumbar vertebra, initial encounter for closed fracture: Secondary | ICD-10-CM | POA: Diagnosis not present

## 2020-02-23 DIAGNOSIS — Z7901 Long term (current) use of anticoagulants: Secondary | ICD-10-CM | POA: Insufficient documentation

## 2020-02-23 DIAGNOSIS — Z881 Allergy status to other antibiotic agents status: Secondary | ICD-10-CM | POA: Diagnosis not present

## 2020-02-23 DIAGNOSIS — Z91030 Bee allergy status: Secondary | ICD-10-CM | POA: Diagnosis not present

## 2020-02-23 DIAGNOSIS — X58XXXS Exposure to other specified factors, sequela: Secondary | ICD-10-CM | POA: Diagnosis not present

## 2020-02-23 DIAGNOSIS — M4856XA Collapsed vertebra, not elsewhere classified, lumbar region, initial encounter for fracture: Secondary | ICD-10-CM | POA: Diagnosis not present

## 2020-02-23 DIAGNOSIS — Z87891 Personal history of nicotine dependence: Secondary | ICD-10-CM | POA: Diagnosis not present

## 2020-02-23 DIAGNOSIS — Z885 Allergy status to narcotic agent status: Secondary | ICD-10-CM | POA: Insufficient documentation

## 2020-02-23 HISTORY — PX: IR KYPHO LUMBAR INC FX REDUCE BONE BX UNI/BIL CANNULATION INC/IMAGING: IMG5519

## 2020-02-23 LAB — BASIC METABOLIC PANEL
Anion gap: 10 (ref 5–15)
BUN: 30 mg/dL — ABNORMAL HIGH (ref 8–23)
CO2: 26 mmol/L (ref 22–32)
Calcium: 10.1 mg/dL (ref 8.9–10.3)
Chloride: 102 mmol/L (ref 98–111)
Creatinine, Ser: 1.69 mg/dL — ABNORMAL HIGH (ref 0.61–1.24)
GFR, Estimated: 38 mL/min — ABNORMAL LOW (ref >60)
Glucose, Bld: 95 mg/dL (ref 70–99)
Potassium: 4.8 mmol/L (ref 3.5–5.1)
Sodium: 138 mmol/L (ref 135–145)

## 2020-02-23 MED ORDER — ACETAMINOPHEN 325 MG PO TABS
650.0000 mg | ORAL_TABLET | Freq: Once | ORAL | Status: AC
  Administered 2020-02-23: 650 mg via ORAL
  Filled 2020-02-23: qty 2

## 2020-02-23 MED ORDER — IOHEXOL 300 MG/ML  SOLN: 10.0000 mL | Freq: Once | INTRAMUSCULAR | Status: DC | PRN

## 2020-02-23 MED ORDER — MIDAZOLAM HCL 2 MG/2ML IJ SOLN
INTRAMUSCULAR | Status: AC | PRN
  Administered 2020-02-23 (×2): 1 mg via INTRAVENOUS

## 2020-02-23 MED ORDER — VANCOMYCIN HCL IN DEXTROSE 1-5 GM/200ML-% IV SOLN: 1000.0000 mg | Freq: Once | INTRAVENOUS | Status: AC

## 2020-02-23 MED ORDER — HYDROMORPHONE HCL 1 MG/ML IJ SOLN
INTRAMUSCULAR | Status: AC
  Filled 2020-02-23: qty 1

## 2020-02-23 MED ORDER — TOBRAMYCIN SULFATE 1.2 G IJ SOLR
INTRAMUSCULAR | Status: AC
  Filled 2020-02-23: qty 1.2

## 2020-02-23 MED ORDER — SODIUM CHLORIDE 0.9 % IV SOLN: INTRAVENOUS | Status: AC

## 2020-02-23 MED ORDER — BUPIVACAINE HCL (PF) 0.5 % IJ SOLN
INTRAMUSCULAR | Status: AC | PRN
Start: 2020-02-23 — End: 2020-02-23
  Administered 2020-02-23: 30 mL

## 2020-02-23 MED ORDER — SODIUM CHLORIDE 0.9 % IV SOLN: INTRAVENOUS | Status: DC

## 2020-02-23 MED ORDER — FENTANYL CITRATE (PF) 100 MCG/2ML IJ SOLN
INTRAMUSCULAR | Status: AC
  Filled 2020-02-23: qty 2

## 2020-02-23 MED ORDER — MIDAZOLAM HCL 2 MG/2ML IJ SOLN
INTRAMUSCULAR | Status: AC
  Filled 2020-02-23: qty 2

## 2020-02-23 MED ORDER — FENTANYL CITRATE (PF) 100 MCG/2ML IJ SOLN
INTRAMUSCULAR | Status: AC | PRN
  Administered 2020-02-23 (×2): 25 ug via INTRAVENOUS

## 2020-02-23 MED ORDER — ACETAMINOPHEN 325 MG PO TABS
ORAL_TABLET | ORAL | Status: AC
  Filled 2020-02-23: qty 2

## 2020-02-23 MED ORDER — BUPIVACAINE HCL (PF) 0.5 % IJ SOLN
INTRAMUSCULAR | Status: AC
  Filled 2020-02-23: qty 30

## 2020-02-23 MED ORDER — VANCOMYCIN HCL IN DEXTROSE 1-5 GM/200ML-% IV SOLN
INTRAVENOUS | Status: AC
  Administered 2020-02-23: 1000 mg via INTRAVENOUS
  Filled 2020-02-23: qty 200

## 2020-02-23 NOTE — Procedures (Signed)
S/P  L1 balloon KP. °S.Tewana Bohlen MD °

## 2020-02-23 NOTE — Progress Notes (Signed)
Iv not started and blood work not drawn Patti in radiology informed states to bring him on down they will do it there.

## 2020-02-23 NOTE — Discharge Instructions (Addendum)
KYPHOPLASTY/VERTEBROPLASTY DISCHARGE INSTRUCTIONS  Medications: (check all that apply)     Resume all home medications as before procedure.                        Continue your pain medications as prescribed as needed.  Over the next 3-5 days, decrease your pain medication as tolerated.  Over the counter medications (i.e. Tylenol, ibuprofen, and aleve) may be substituted once severe/moderate pain symptoms have subsided.   Wound Care: - Bandages may be removed the day following your procedure.  You may get your incision wet once bandages are removed.  Bandaids may be used to cover the incisions until scab formation.  Topical ointments are optional.  - If you develop a fever greater than 101 degrees, have increased skin redness at the incision sites or pus-like oozing from incisions occurring within 1 week of the procedure, contact radiology at [phone] or [phone].  - Ice pack to back for 15-20 minutes 2-3 time per day for first 2-3 days post procedure.  The ice will expedite muscle healing and help with the pain from the incisions.   Activity: - Bedrest today with limited activity for 24 hours post procedure.  - No driving for 48 hours.  - Increase your activity as tolerated after bedrest (with assistance if necessary).  - Refrain from any strenuous activity or heavy lifting (greater than 10 lbs.).   Follow up: - Contact radiology at [phone] or [phone] if any questions/concerns.  - A physician assistant from radiology will contact you in approximately 1 week.  - If a biopsy was performed at the time of your procedure, your referring physician should receive the results in usually 2-3 days.        1.No stooping,bending or lifting more than 10 lbs for 2 weeks. 2.Use walker to ambulate for 2weeks. 3.No driving for 2 weeks. 4.RTC PRN 2 weeks. 5.Check with  Dr Nelva Bush office the results of the biopsy. 6.Resume  xarelto in am tomorrow

## 2020-02-23 NOTE — H&P (Signed)
Chief Complaint: Patient was seen in consultation today for Lumbar 1 Kyphoplasty at request of Dr Mable Paris   Supervising Physician: Luanne Bras  Patient Status: [facility] - Out-pt  History of Present Illness: [name] [name] is a 87 y.o. male   Pt developed low back pain 2 months ago Worsening-- especially after recent surgery No injury aware of Has tried Vicodin - no relief and constipating Tylenol now--- helps only little while  Outside MRI lumbar spine 02/17/20 Revealing acute L1 Fracture Request made for management of fracture Dr Estanislado Pandy has reviewed imaging and approves L1 KP  Taking Xarelto daily after heart valve replacement 02/2020 Last dose 2-3 days ago (approved with Dr Einar Gip)  Past Medical History:  Diagnosis Date   AAA (abdominal aortic aneurysm) ([place])    Acute coronary syndrome ([place]) 08/11/9145   Acute systolic heart failure ([place]) 03/03/2012   Angina    Arthritis    Ascending aorta dilatation ([place]) 03/13/2012   Fusiform dilatation of the ascending thoracic aorta discovered during surgery, max diameter 4.2-4.3 cm   Atrial fibrillation ([place]) 02/29/2012   Recurrent paroxysmal, new-onset   BPH (benign prostatic hypertrophy)    CAD (coronary artery disease)    Cath April 2012 40% left main, 50% LAD, 40% OM, occluded RCA with L-R collaterals    CHF (congestive heart failure) (H[facility])    Chronic venous insufficiency    Dyspnea    GERD (gastroesophageal reflux disease)    Glaucoma    Gout    Headache    MIGRAINES in the past   History of kidney stones    Hyperlipidemia    Hypertension    MI (myocardial infarction) ([place])    Obesity (BMI 30-39.9)    Paroxysmal atrial fibrillation ([facility]) 02/29/2012   Recurrent paroxysmal, new-onset    PMR (polymyalgia rheumatica) ([facility])    Pre-diabetes    S/P CABG x 3 03/06/2012   LIMA to LAD, SVG to OM, SVG to RCA, EVH via right thigh   S/P Maze operation for atrial fibrillation 03/06/2012   Complete  bilateral lesions set using bipolar radiofrequency and cryothermy ablation with clipping of LA appendage   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   s/p TAVR with a 26 mm Edwards via the left subclavian by Drs Buena Irish and Bartle   Severe aortic stenosis 09/29/2019   Sleep apnea    USES CPAP NIGHTLY    Past Surgical History:  Procedure Laterality Date   CARDIAC CATHETERIZATION     CATARACT EXTRACTION, BILATERAL     with lens implants   CHOLECYSTECTOMY N/A 03/24/2012   Procedure: LAPAROSCOPIC CHOLECYSTECTOMY WITH INTRAOPERATIVE CHOLANGIOGRAM;  Surgeon: Zenovia Jarred, MD;  Location: [facility];  Service: General;  Laterality: N/A;   CORONARY ARTERY BYPASS GRAFT N/A 03/06/2012   Procedure: CORONARY ARTERY BYPASS GRAFTING (CABG);  Surgeon: Rexene Alberts, MD;  Location: [facility];  Service: Open Heart Surgery;  Laterality: N/A;   ENDOVEIN HARVEST OF GREATER SAPHENOUS VEIN Right 03/06/2012   Procedure: ENDOVEIN HARVEST OF GREATER SAPHENOUS VEIN;  Surgeon: Rexene Alberts, MD;  Location: [facility];  Service: Open Heart Surgery;  Laterality: Right;   INTRAOPERATIVE TRANSESOPHAGEAL ECHOCARDIOGRAM N/A 03/06/2012   Procedure: INTRAOPERATIVE TRANSESOPHAGEAL ECHOCARDIOGRAM;  Surgeon: Rexene Alberts, MD;  Location: [facility];  Service: Open Heart Surgery;  Laterality: N/A;   LEFT HEART CATH Right 03/02/2012   Procedure: LEFT HEART CATH;  Surgeon: Sherren Mocha, MD;  Location: [facility] CATH LAB;  Service: Cardiovascular;  Laterality: Right;  MAZE N/A 03/06/2012   Procedure: MAZE;  Surgeon: Rexene Alberts, MD;  Location: [facility];  Service: Open Heart Surgery;  Laterality: N/A;   PROSTATECTOMY     partial   RIGHT/LEFT HEART CATH AND CORONARY/GRAFT ANGIOGRAPHY N/A 09/29/2019   Procedure: RIGHT/LEFT HEART CATH AND CORONARY/GRAFT ANGIOGRAPHY;  Surgeon: Adrian Prows, MD;  Location: [facility] CV LAB;  Service: Cardiovascular;  Laterality: N/A;   TEE WITHOUT CARDIOVERSION N/A 11/10/2019   Procedure:  TRANSESOPHAGEAL ECHOCARDIOGRAM (TEE);  Surgeon: Burnell Blanks, MD;  Location: [facility];  Service: Open Heart Surgery;  Laterality: N/A;   TRANSCATHETER AORTIC VALVE REPLACEMENT, TRANSFEMORAL  11/10/2019    Allergies: Amoxicillin, Bee venom, and Morphine and related  Medications: Prior to Admission medications   Medication Sig Start Date End Date Taking? Authorizing Provider  acetaminophen (TYLENOL) 500 MG tablet Take 500 mg by mouth in the morning and at bedtime.   Yes [provider]  allopurinol (ZYLOPRIM) 300 MG tablet Take 150 mg by mouth Daily. 02/26/11  Yes [provider]  amiodarone (PACERONE) 200 MG tablet TAKE 1 TABLET(200 MG) BY MOUTH DAILY Patient taking differently: Take 200 mg by mouth daily. TAKE 1 TABLET(200 MG) BY MOUTH DAILY 12/16/19  Yes Cantwell, Celeste C, PA-C  amLODipine (NORVASC) 5 MG tablet TAKE 1 TABLET BY MOUTH DAILY Patient taking differently: Take 5 mg by mouth daily. 11/16/19  Yes Adrian Prows, MD  azithromycin (ZITHROMAX) 500 MG tablet Take one tablet (500 mg) one hour prior to all dental visits. Patient taking differently: Take by mouth See admin instructions. Take one tablet (500 mg) one hour prior to all dental visits. 11/19/19  Yes Eileen Stanford, PA-C  cholecalciferol (VITAMIN D3) 25 MCG (1000 UNIT) tablet Take 1,000 Units by mouth daily.    Yes [provider]  EPINEPHrine 0.3 mg/0.3 mL IJ SOAJ injection Inject 0.3 mLs (0.3 mg total) into the muscle as needed for anaphylaxis. 08/08/18  Yes Ambs, Kathrine Cords, FNP  ezetimibe (ZETIA) 10 MG tablet Take 1 tablet (10 mg total) by mouth daily. 12/17/19  Yes Cantwell, Celeste C, PA-C  fish oil-omega-3 fatty acids 1000 MG capsule Take 1 g by mouth daily.    Yes [provider]  furosemide (LASIX) 40 MG tablet Take 1 tablet (40 mg total) by mouth daily. 12/16/19  Yes Cantwell, Celeste C, PA-C  galantamine (RAZADYNE ER) 8 MG 24 hr capsule Take 8 mg by mouth Daily. 02/26/11  Yes  [provider]  HONEY BEE VENOM IJ Inject 2 each as directed See admin instructions. Every eight weeks   Yes [provider]  HYDROcodone-acetaminophen (NORCO/VICODIN) 5-325 MG tablet Take 1 tablet by mouth every 6 (six) hours as needed for severe pain. 12/10/19  Yes [provider]  isosorbide mononitrate (IMDUR) 60 MG 24 hr tablet TAKE 1 TABLET(60 MG) BY MOUTH DAILY AFTER SUPPER Patient taking differently: Take 60 mg by mouth daily. TAKE 1 TABLET(60 MG) BY MOUTH DAILY AFTER SUPPER 11/30/19  Yes Adrian Prows, MD  memantine (NAMENDA) 10 MG tablet Take 10 mg by mouth 2 (two) times daily. 09/08/19  Yes [provider]  methocarbamol (ROBAXIN) 500 MG tablet Take 250 mg by mouth every 8 (eight) hours as needed for muscle spasms. 12/09/19  Yes [provider]  metoprolol tartrate (LOPRESSOR) 25 MG tablet Take 25 mg by mouth 3 (three) times daily as needed (Blood pressure).   Yes [provider]  nitroGLYCERIN (NITROSTAT) 0.4 MG SL tablet Place 1 tablet (0.4 mg  total) under the tongue every 5 (five) minutes x 3 doses as needed for chest pain. 12/16/19  Yes Cantwell, Celeste C, PA-C  pantoprazole (PROTONIX) 40 MG tablet Take 40 mg by mouth every other day.  10/28/14  Yes [provider]  potassium chloride (MICRO-K) 10 MEQ CR capsule TAKE 1 CAPSULE BY MOUTH DAILY WITH LASIX Patient taking differently: Take 10 mEq by mouth daily. 02/05/20  Yes Cantwell, Celeste C, PA-C  predniSONE (DELTASONE) 1 MG tablet Take 5 mg by mouth daily with breakfast.   Yes [provider]  PRESCRIPTION MEDICATION Cpap   Yes [provider]  Psyllium Husk POWD Take 1 Scoop by mouth daily.    Yes [provider]  Rivaroxaban (XARELTO) 15 MG TABS tablet Take 15 mg by mouth daily with supper.   Yes [provider]  rosuvastatin (CRESTOR) 10 MG tablet TAKE 1 TABLET(10 MG) BY MOUTH DAILY Patient taking differently: Take 10 mg by mouth daily.  02/08/20  Yes Cantwell, Celeste C, PA-C  telmisartan (MICARDIS) 40 MG tablet Take 20 mg by mouth daily.    Yes [provider]  triamcinolone cream (KENALOG) 0.1 % Apply 1 application topically as needed (skin irritation).    Yes [provider]  vitamin B-12 (CYANOCOBALAMIN) 1000 MCG tablet Take 1,000 mcg by mouth daily.    Yes [provider]     Family History  Problem Relation Age of Onset   Dementia Mother    Stroke Brother     Social History   Socioeconomic History   Marital status: Married    Spouse name: Not on file   Number of children: 4   Years of education: Not on file   Highest education level: Not on file  Occupational History   Occupation: retired    Comment: [employer]  Tobacco Use   Smoking status: Former Smoker    Packs/day: 0.50    Years: 15.00    Pack years: 7.50    Types: Cigarettes    Quit date: 11/18/1962    Years since quitting: 57.3   Smokeless tobacco: Never Used  Vaping Use   Vaping Use: Never used  Substance and Sexual Activity   Alcohol use: Yes    Alcohol/week: 2.0 standard drinks    Types: 2 Shots of liquor per week    Comment: daily   Drug use: Never   Sexual activity: Yes  Other Topics Concern   Not on file  Social History Narrative   Lives with wife in [place].     Very active physically without significant limitations.    Exercises regularly   Social Determinants of Health   Financial Resource Strain: Not on file  Food Insecurity: Not on file  Transportation Needs: Not on file  Physical Activity: Not on file  Stress: Not on file  Social Connections: Not on file     Review of Systems: A 12 point ROS discussed and pertinent positives are indicated in the HPI above.  All other systems are negative.  Review of Systems  Constitutional: Positive for activity change. Negative for fatigue and fever.  Respiratory: Negative for cough and shortness of breath.   Cardiovascular:  Negative for chest pain.  Gastrointestinal: Negative for abdominal pain.  Musculoskeletal: Positive for back pain and gait problem.  Psychiatric/Behavioral: Negative for behavioral problems and confusion.    Vital Signs: BP (!) 145/69    Pulse 75    Temp 98.3 F (36.8 C) (Oral)    Ht 6'  1" (1.854 m)    Wt 227 lb (103 kg)    SpO2 98%    BMI 29.95 kg/m   Physical Exam Vitals reviewed.  HENT:     Mouth/Throat:     Mouth: Mucous membranes are moist.  Cardiovascular:     Rate and Rhythm: Normal rate and regular rhythm.     Heart sounds: Normal heart sounds.  Pulmonary:     Effort: Pulmonary effort is normal.     Breath sounds: Normal breath sounds.  Abdominal:     Palpations: Abdomen is soft.  Musculoskeletal:        General: Tenderness present. No swelling. Normal range of motion.     Comments: Low back pain Travels to both sides Moves all 4s   Skin:    General: Skin is warm.  Neurological:     Mental Status: He is oriented to person, place, and time.  Psychiatric:        Behavior: Behavior normal.     Imaging: No results found.  Labs:  CBC: Recent Labs    09/23/19 1341 09/29/19 0759 10/22/19 1146 11/06/19 0836 11/10/19 0914 11/10/19 0944 11/10/19 1108 11/11/19 0416  WBC 7.4  --  7.3 5.2  --   --   --  7.0  HGB 10.8*   < > 10.7* 10.6* 8.8* 9.9* 10.5* 9.6*  HCT 32.9*   < > 34.2* 34.1* 26.0* 29.0* 31.0* 30.3*  PLT 199  --  178 168  --   --   --  128*   < > = values in this interval not displayed.    COAGS: Recent Labs    10/22/19 1146 11/06/19 0836  INR 1.2 1.0  APTT 32 30    BMP: Recent Labs    09/23/19 1341 09/29/19 0759 10/09/19 1151 10/13/19 0838 11/06/19 0836 11/10/19 0914 11/10/19 1108 11/11/19 0416 11/19/19 1410 12/18/19 1124  NA 140   < > 141   < > 138   < > 140 138 139 139  K 4.6   < > 4.1   < > 4.0   < > 3.8 4.4 4.3 4.1  CL 101  --  103   < > 106   < > 106 106 100 100  CO2 23  --  23   < > 21*  --   --  24 25 25   GLUCOSE 117*   --  115*   < > 161*   < > 148* 125* 105* 119*  BUN 30  --  25   < > 27*   < > 25* 25* 29 21  CALCIUM 9.8  --  10.0   < > 9.5  --   --  9.5 9.7 9.9  CREATININE 1.45*  --  1.33*   < > 1.37*   < > 1.20 1.26* 1.52* 1.77*  GFRNONAA 42*  --  46*   < > 49*  --   --  54* 39* 33*  GFRAA 48*  --  54*  --   --   --   --   --  46* 38*   < > = values in this interval not displayed.    LIVER FUNCTION TESTS: Recent Labs    09/23/19 1341 10/22/19 1146 11/06/19 0836  BILITOT 0.4 0.8 0.3  AST 14 17 20   ALT 16 14 16   ALKPHOS 58 42 42  PROT 6.6 6.6 6.4*  ALBUMIN 4.4 3.7 3.4*    TUMOR MARKERS: No results for  input(s): AFPTM, CEA, CA199, CHROMGRNA in the last 8760 hours.  Assessment and Plan:  LBP x 2 months- denies injury-- worsening pain Pain meds without relief Using walker to get around L1 acute fx on MRI Scheduled now for Lumbar 1 KP Risks and benefits of Lumbar 1 Kyphoplasty were discussed with the patient including, but not limited to education regarding the natural healing process of compression fractures without intervention, bleeding, infection, cement migration which may cause spinal cord damage, paralysis, pulmonary embolism or even death.  This interventional procedure involves the use of X-rays and because of the nature of the planned procedure, it is possible that we will have prolonged use of X-ray fluoroscopy.  Potential radiation risks to you include (but are not limited to) the following: - A slightly elevated risk for cancer  several years later in life. This risk is typically less than 0.5% percent. This risk is low in comparison to the normal incidence of human cancer, which is 33% for women and 50% for men according to the [place]. - Radiation induced injury can include skin redness, resembling a rash, tissue breakdown / ulcers and hair loss (which can be temporary or permanent).   The likelihood of either of these occurring depends on the difficulty of  the procedure and whether you are sensitive to radiation due to previous procedures, disease, or genetic conditions.   IF your procedure requires a prolonged use of radiation, you will be notified and given written instructions for further action.  It is your responsibility to monitor the irradiated area for the 2 weeks following the procedure and to notify your physician if you are concerned that you have suffered a radiation induced injury.    All of the patient's questions were answered, patient is agreeable to proceed.  Consent signed and in chart.  Thank you for this interesting consult.  I greatly enjoyed meeting [name] and look forward to participating in their care.  A copy of this report was sent to the requesting provider on this date.  Electronically Signed: Lavonia Drafts, PA-C 02/23/2020, 8:57 AM   I spent a total of  30 Minutes   in face to face in clinical consultation, greater than 50% of which was counseling/coordinating care for L1 KP

## 2020-02-26 LAB — SURGICAL PATHOLOGY

## 2020-03-01 DIAGNOSIS — M5459 Other low back pain: Secondary | ICD-10-CM | POA: Diagnosis not present

## 2020-03-01 DIAGNOSIS — M353 Polymyalgia rheumatica: Secondary | ICD-10-CM | POA: Diagnosis not present

## 2020-03-01 DIAGNOSIS — K5903 Drug induced constipation: Secondary | ICD-10-CM | POA: Diagnosis not present

## 2020-03-03 ENCOUNTER — Other Ambulatory Visit: Payer: Self-pay | Admitting: Cardiology

## 2020-03-03 DIAGNOSIS — I480 Paroxysmal atrial fibrillation: Secondary | ICD-10-CM

## 2020-03-07 ENCOUNTER — Other Ambulatory Visit: Payer: Self-pay | Admitting: Cardiology

## 2020-03-07 ENCOUNTER — Other Ambulatory Visit: Payer: Self-pay

## 2020-03-07 DIAGNOSIS — I5032 Chronic diastolic (congestive) heart failure: Secondary | ICD-10-CM

## 2020-03-07 DIAGNOSIS — I480 Paroxysmal atrial fibrillation: Secondary | ICD-10-CM

## 2020-03-07 MED ORDER — AMIODARONE HCL 200 MG PO TABS: 200.0000 mg | ORAL_TABLET | Freq: Every day | ORAL | 3 refills | Status: DC

## 2020-03-08 DIAGNOSIS — L578 Other skin changes due to chronic exposure to nonionizing radiation: Secondary | ICD-10-CM | POA: Diagnosis not present

## 2020-03-09 ENCOUNTER — Other Ambulatory Visit: Payer: Self-pay | Admitting: Student

## 2020-03-09 DIAGNOSIS — I480 Paroxysmal atrial fibrillation: Secondary | ICD-10-CM

## 2020-03-09 MED ORDER — AMIODARONE HCL 200 MG PO TABS: 200.0000 mg | ORAL_TABLET | Freq: Every day | ORAL | 3 refills | Status: DC

## 2020-03-12 DIAGNOSIS — M47816 Spondylosis without myelopathy or radiculopathy, lumbar region: Secondary | ICD-10-CM | POA: Diagnosis not present

## 2020-03-14 DIAGNOSIS — M8008XD Age-related osteoporosis with current pathological fracture, vertebra(e), subsequent encounter for fracture with routine healing: Secondary | ICD-10-CM | POA: Diagnosis not present

## 2020-03-14 DIAGNOSIS — M353 Polymyalgia rheumatica: Secondary | ICD-10-CM | POA: Diagnosis not present

## 2020-03-14 DIAGNOSIS — M48061 Spinal stenosis, lumbar region without neurogenic claudication: Secondary | ICD-10-CM | POA: Diagnosis not present

## 2020-03-14 DIAGNOSIS — F32A Depression, unspecified: Secondary | ICD-10-CM | POA: Diagnosis not present

## 2020-03-14 DIAGNOSIS — R7303 Prediabetes: Secondary | ICD-10-CM | POA: Diagnosis not present

## 2020-03-14 DIAGNOSIS — E7800 Pure hypercholesterolemia, unspecified: Secondary | ICD-10-CM | POA: Diagnosis not present

## 2020-03-14 DIAGNOSIS — G8929 Other chronic pain: Secondary | ICD-10-CM | POA: Diagnosis not present

## 2020-03-14 DIAGNOSIS — M47816 Spondylosis without myelopathy or radiculopathy, lumbar region: Secondary | ICD-10-CM | POA: Diagnosis not present

## 2020-03-14 DIAGNOSIS — H9193 Unspecified hearing loss, bilateral: Secondary | ICD-10-CM | POA: Diagnosis not present

## 2020-03-14 DIAGNOSIS — M1991 Primary osteoarthritis, unspecified site: Secondary | ICD-10-CM | POA: Diagnosis not present

## 2020-03-14 DIAGNOSIS — G4733 Obstructive sleep apnea (adult) (pediatric): Secondary | ICD-10-CM | POA: Diagnosis not present

## 2020-03-14 DIAGNOSIS — I110 Hypertensive heart disease with heart failure: Secondary | ICD-10-CM | POA: Diagnosis not present

## 2020-03-14 DIAGNOSIS — I2510 Atherosclerotic heart disease of native coronary artery without angina pectoris: Secondary | ICD-10-CM | POA: Diagnosis not present

## 2020-03-14 DIAGNOSIS — M5136 Other intervertebral disc degeneration, lumbar region: Secondary | ICD-10-CM | POA: Diagnosis not present

## 2020-03-14 DIAGNOSIS — I509 Heart failure, unspecified: Secondary | ICD-10-CM | POA: Diagnosis not present

## 2020-03-14 DIAGNOSIS — H409 Unspecified glaucoma: Secondary | ICD-10-CM | POA: Diagnosis not present

## 2020-03-15 ENCOUNTER — Other Ambulatory Visit: Payer: Self-pay

## 2020-03-15 ENCOUNTER — Ambulatory Visit: Payer: Medicare Other | Admitting: Student

## 2020-03-15 ENCOUNTER — Encounter: Payer: Self-pay | Admitting: Student

## 2020-03-15 VITALS — BP 120/64 | HR 71 | Temp 97.8°F | Resp 16 | Ht 73.0 in | Wt 236.6 lb

## 2020-03-15 DIAGNOSIS — I480 Paroxysmal atrial fibrillation: Secondary | ICD-10-CM | POA: Diagnosis not present

## 2020-03-15 DIAGNOSIS — E7800 Pure hypercholesterolemia, unspecified: Secondary | ICD-10-CM

## 2020-03-15 DIAGNOSIS — Z952 Presence of prosthetic heart valve: Secondary | ICD-10-CM | POA: Diagnosis not present

## 2020-03-15 DIAGNOSIS — I10 Essential (primary) hypertension: Secondary | ICD-10-CM | POA: Diagnosis not present

## 2020-03-15 NOTE — Progress Notes (Signed)
Primary Physician/Referring:  [name], MD  Patient ID: [name] [name], male    DOB: 11/18/1926, 87 y.o.   MRN: [id number]  Chief Complaint  Patient presents with  . Atrial Fibrillation  . Congestive Heart Failure  . Hyperlipidemia  . Follow-up    3 month   HPI:    [name] [name]  is a 87 y.o. Caucasian male with vitamin B12 deficiency, mild dementia, hyperlipidemia, hypertension, CAD, chronic stable angina status post CABG x 3 and Maze and LAA amputation in 2014, paroxysmal atrial fibrillation, obstructive sleep apnea not currently on CPAP, moderate aortic stenosis with thoracic aortic aneurysm and dilation of ascending aorta and follows Dr. Lilly Cove (No correlation with TTE with regards to aneurysm), venous stasis edema. Patient underwent TAVR 11/10/2019, at which time he presented in atypical atrial flutter but negative chronotropic were avoided due to age, aortic valve disease and bradycardia. He spontaneously converted to sinus, but presented to our office in atrial fibrillation and acute decompensated heart failure on 11/13/2019.  At this time he was started on increased dose of diuretics as well as amiodarone, since then patient's had no known recurrence of atrial fibrillation.  He now presents for 85-month follow-up of atrial fibrillation and heart failure, as well as status post TAVR.  Patient's primary concern at this time is severe chronic back pain secondary t spinal compression fracture diagnosed in January 2022.  He is presently being treated and evaluated by Dr. Lanae Crumbly with [facility], who is considering steroid injections for pain management.  Have advised patient to have Dr. Loretha Brasil office reach out regarding recommendations for management of Xarelto.  He has also had occasional episodes of right and left anterior shoulder pain at night.  The pain is not associated with exertion and lasts less than 5 minutes.  From a cardiovascular standpoint, patient states he is feeling well  overall.  He continues to have chronic bilateral lower leg edema which he feels has improved since last visit and dyspnea on exertion, which is slowly improving.  Denies chest pain, palpitations, orthopnea, PND, dizziness, syncope, near syncope.  Patient has had no recurrence of palpitations or heart racing symptoms, and therefore has not taken metoprolol since last visit.  Over the last 1 to 2 months patient has taken nitroglycerin 1-2 times for chest pain, pain subsequently subsided.  Patient is presently tolerating anticoagulation without bleeding diathesis, denies hematuria, hematochezia, melena.  Notably patient's physical activity is significantly limited at this time secondary to back pain.  He is presently doing physical therapy twice weekly without issue.  Past Medical History:  Diagnosis Date  . AAA (abdominal aortic aneurysm) ([place])   . Acute coronary syndrome ([place]) 02/29/2012  . Acute systolic heart failure ([place]) 03/03/2012  . Angina   . Arthritis   . Ascending aorta dilatation (HCC) 03/13/2012   Fusiform dilatation of the ascending thoracic aorta discovered during surgery, max diameter 4.2-4.3 cm  . Atrial fibrillation ([place]) 02/29/2012   Recurrent paroxysmal, new-onset  . BPH (benign prostatic hypertrophy)   . CAD (coronary artery disease)    Cath April 2012 40% left main, 50% LAD, 40% OM, occluded RCA with L-R collaterals   . CHF (congestive heart failure) ([place])   . Chronic venous insufficiency   . Dyspnea   . GERD (gastroesophageal reflux disease)   . Glaucoma   . Gout   . Headache    MIGRAINES in the past  . History of kidney stones   . Hyperlipidemia   .  Hypertension   . MI (myocardial infarction) ([place])   . Obesity (BMI 30-39.9)   . Paroxysmal atrial fibrillation (HCC) 02/29/2012   Recurrent paroxysmal, new-onset   . PMR (polymyalgia rheumatica) (HCC)   . Pre-diabetes   . S/P CABG x 3 03/06/2012   LIMA to LAD, SVG to OM, SVG to RCA, EVH via right thigh  . S/P Maze  operation for atrial fibrillation 03/06/2012   Complete bilateral lesions set using bipolar radiofrequency and cryothermy ablation with clipping of LA appendage  . 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   s/p TAVR with a 26 mm Edwards via the left subclavian by Drs Buena Irish and Cyndia Bent  . Severe aortic stenosis 09/29/2019  . Sleep apnea    USES CPAP NIGHTLY   Past Surgical History:  Procedure Laterality Date  . CARDIAC CATHETERIZATION    . CATARACT EXTRACTION, BILATERAL     with lens implants  . CHOLECYSTECTOMY N/A 03/24/2012   Procedure: LAPAROSCOPIC CHOLECYSTECTOMY WITH INTRAOPERATIVE CHOLANGIOGRAM;  Surgeon: Zenovia Jarred, MD;  Location: [facility];  Service: General;  Laterality: N/A;  . CORONARY ARTERY BYPASS GRAFT N/A 03/06/2012   Procedure: CORONARY ARTERY BYPASS GRAFTING (CABG);  Surgeon: Rexene Alberts, MD;  Location: [place];  Service: Open Heart Surgery;  Laterality: N/A;  . ENDOVEIN HARVEST OF GREATER SAPHENOUS VEIN Right 03/06/2012   Procedure: ENDOVEIN HARVEST OF GREATER SAPHENOUS VEIN;  Surgeon: Rexene Alberts, MD;  Location: [facility];  Service: Open Heart Surgery;  Laterality: Right;  . INTRAOPERATIVE TRANSESOPHAGEAL ECHOCARDIOGRAM N/A 03/06/2012   Procedure: INTRAOPERATIVE TRANSESOPHAGEAL ECHOCARDIOGRAM;  Surgeon: Rexene Alberts, MD;  Location: [facility];  Service: Open Heart Surgery;  Laterality: N/A;  . IR KYPHO LUMBAR INC FX REDUCE BONE BX UNI/BIL CANNULATION INC/IMAGING  02/23/2020  . LEFT HEART CATH Right 03/02/2012   Procedure: LEFT HEART CATH;  Surgeon: Sherren Mocha, MD;  Location: [facility] CATH LAB;  Service: Cardiovascular;  Laterality: Right;  Marland Kitchen MAZE N/A 03/06/2012   Procedure: MAZE;  Surgeon: Rexene Alberts, MD;  Location: [place];  Service: Open Heart Surgery;  Laterality: N/A;  . PROSTATECTOMY     partial  . RIGHT/LEFT HEART CATH AND CORONARY/GRAFT ANGIOGRAPHY N/A 09/29/2019   Procedure: RIGHT/LEFT HEART CATH AND CORONARY/GRAFT ANGIOGRAPHY;  Surgeon: Adrian Prows, MD;   Location: [place] CV LAB;  Service: Cardiovascular;  Laterality: N/A;  . TEE WITHOUT CARDIOVERSION N/A 11/10/2019   Procedure: TRANSESOPHAGEAL ECHOCARDIOGRAM (TEE);  Surgeon: Burnell Blanks, MD;  Location: [place];  Service: Open Heart Surgery;  Laterality: N/A;  . TRANSCATHETER AORTIC VALVE REPLACEMENT, TRANSFEMORAL  11/10/2019   Family History  Problem Relation Age of Onset  . Dementia Mother   . Stroke Brother     Social History   Tobacco Use  . Smoking status: Former Smoker    Packs/day: 0.50    Years: 15.00    Pack years: 7.50    Types: Cigarettes    Quit date: 11/18/1962    Years since quitting: 57.3  . Smokeless tobacco: Never Used  Substance Use Topics  . Alcohol use: Yes    Alcohol/week: 2.0 standard drinks    Types: 2 Shots of liquor per week    Comment: daily   ROS  Review of Systems  Constitutional: Negative for malaise/fatigue and weight gain.  Cardiovascular: Positive for dyspnea on exertion and leg swelling (chronic, stable). Negative for chest pain, claudication, irregular heartbeat, near-syncope, orthopnea, palpitations, paroxysmal nocturnal dyspnea and syncope.  Hematologic/Lymphatic: Does not bruise/bleed easily.  Musculoskeletal: Positive for arthritis (mild and stable), back pain, joint pain and myalgias (recent diagnosis of PMR).  Gastrointestinal: Negative for constipation and melena.  Neurological: Negative for dizziness and weakness.   Objective  Blood pressure 120/64, pulse 71, temperature 97.8 F (36.6 C), temperature source Temporal, resp. rate 16, height 6\' 1"  (1.854 m), weight 236 lb 9.6 oz (107.3 kg), SpO2 99 %.  Vitals with BMI 03/15/2020 02/23/2020 02/23/2020  Height 6\' 1"  - -  Weight 236 lbs 10 oz - -  BMI 41.66 - -  Systolic 063 016 010  Diastolic 64 70 77  Pulse 71 66 64     Physical Exam Constitutional:      General: He is not in acute distress.    Appearance: He is well-developed.  Cardiovascular:     Rate and Rhythm:  Normal rate and regular rhythm.     Pulses:          Carotid pulses are 2+ on the right side and 2+ on the left side.      Radial pulses are 2+ on the right side and 2+ on the left side.       Femoral pulses are 2+ on the right side and 2+ on the left side.      Popliteal pulses are 2+ on the right side and 2+ on the left side.       Dorsalis pedis pulses are 0 on the right side and 1+ on the left side.       Posterior tibial pulses are 0 on the right side and 0 on the left side.     Heart sounds: Murmur heard.   Early systolic murmur is present with a grade of 2/6 at the upper right sternal border. S1 is normal, S2 is muffled.  No gallop.      Comments: Vascular examination reveals bilateral pigmented lower extremities with varicose veins.  1-2+ pitting edema. No JVD.  Pulmonary:     Effort: Pulmonary effort is normal.     Breath sounds: No rhonchi or rales.  Abdominal:     General: Bowel sounds are normal.     Palpations: Abdomen is soft.  Musculoskeletal:     Right lower leg: Edema present.     Left lower leg: Edema present.  Skin:    General: Skin is warm and dry.    Laboratory examination:   Recent Labs    10/09/19 1151 10/13/19 0838 11/19/19 1410 12/18/19 1124 02/23/20 1135  NA 141   < > 139 139 138  K 4.1   < > 4.3 4.1 4.8  CL 103   < > 100 100 102  CO2 23   < > 25 25 26   GLUCOSE 115*   < > 105* 119* 95  BUN 25   < > 29 21 30*  CREATININE 1.33*   < > 1.52* 1.77* 1.69*  CALCIUM 10.0   < > 9.7 9.9 10.1  GFRNONAA 46*   < > 39* 33* 38*  GFRAA 54*  --  46* 38*  --    < > = values in this interval not displayed.   estimated creatinine clearance is 36.6 mL/min (A) (by C-G formula based on SCr of 1.69 mg/dL (H)).  CMP Latest Ref Rng & Units 02/23/2020 12/18/2019 11/19/2019  Glucose 70 - 99 mg/dL 95 119(H) 105(H)  BUN 8 - 23 mg/dL 30(H) 21 29  Creatinine 0.61 - 1.24 mg/dL 1.69(H) 1.77(H) 1.52(H)  Sodium 135 - 145 mmol/L  138 139 139  Potassium 3.5 - 5.1 mmol/L 4.8 4.1  4.3  Chloride 98 - 111 mmol/L 102 100 100  CO2 22 - 32 mmol/L 26 25 25   Calcium 8.9 - 10.3 mg/dL 10.1 9.9 9.7  Total Protein 6.5 - 8.1 g/dL - - -  Total Bilirubin 0.3 - 1.2 mg/dL - - -  Alkaline Phos 38 - 126 U/L - - -  AST 15 - 41 U/L - - -  ALT 0 - 44 U/L - - -   CBC Latest Ref Rng & Units 11/11/2019 11/10/2019 11/10/2019  WBC 4.0 - 10.5 K/uL 7.0 - -  Hemoglobin 13.0 - 17.0 g/dL 9.6(L) 10.5(L) 9.9(L)  Hematocrit 39.0 - 52.0 % 30.3(L) 31.0(L) 29.0(L)  Platelets 150 - 400 K/uL 128(L) - -   TSH Recent Labs    09/23/19 1341  TSH 1.950    External labs:   Cholesterol, total 115.000 m 01/19/2019 HDL 47 MG/DL 01/19/2019 LDL 51.000 mg 01/19/2019 Triglycerides 84.000 01/19/2019  A1C 5.600 % 01/19/2019; TSH 2.320 01/19/2019  Hemoglobin 10.900 g/ 02/06/2019; INR 1.900 09/11/2018 Platelets 182.000 09/11/2018  Creatinine, Serum 1.300 mg/ 02/06/2019 Potassium 4.100 09/11/2018 Magnesium N/D ALT (SGPT) 15.000 uni 01/19/2019  Medications and allergies   Allergies  Allergen Reactions  . Amoxicillin Other (See Comments)    Headache, debilitated  . Bee Venom Anaphylaxis  . Morphine And Related Other (See Comments)    hypotension    Current Outpatient Medications on File Prior to Visit  Medication Sig Dispense Refill  . acetaminophen (TYLENOL) 500 MG tablet Take 500 mg by mouth in the morning and at bedtime.    Marland Kitchen allopurinol (ZYLOPRIM) 300 MG tablet Take 150 mg by mouth Daily.    Marland Kitchen amiodarone (PACERONE) 200 MG tablet Take 1 tablet (200 mg total) by mouth daily. 90 tablet 3  . amLODipine (NORVASC) 5 MG tablet TAKE 1 TABLET BY MOUTH DAILY (Patient taking differently: Take 5 mg by mouth daily.) 90 tablet 3  . azithromycin (ZITHROMAX) 500 MG tablet Take one tablet (500 mg) one hour prior to all dental visits. (Patient taking differently: Take by mouth See admin instructions. Take one tablet (500 mg) one hour prior to all dental visits.) 3 tablet 5  . cholecalciferol (VITAMIN D3) 25 MCG (1000 UNIT)  tablet Take 1,000 Units by mouth daily.     Marland Kitchen EPINEPHrine 0.3 mg/0.3 mL IJ SOAJ injection Inject 0.3 mLs (0.3 mg total) into the muscle as needed for anaphylaxis. 2 each 2  . ezetimibe (ZETIA) 10 MG tablet Take 1 tablet (10 mg total) by mouth daily. 30 tablet 6  . fish oil-omega-3 fatty acids 1000 MG capsule Take 1 g by mouth daily.     . furosemide (LASIX) 40 MG tablet TAKE 1 TABLET(40 MG) BY MOUTH TWICE DAILY AT 10 AM AND AT 4 PM 180 tablet 0  . galantamine (RAZADYNE ER) 8 MG 24 hr capsule Take 8 mg by mouth Daily.    . HONEY BEE VENOM IJ Inject 2 each as directed See admin instructions. Every eight weeks    . HYDROcodone-acetaminophen (NORCO/VICODIN) 5-325 MG tablet Take 1 tablet by mouth every 6 (six) hours as needed for severe pain.    . isosorbide mononitrate (IMDUR) 60 MG 24 hr tablet TAKE 1 TABLET(60 MG) BY MOUTH DAILY AFTER SUPPER (Patient taking differently: Take 60 mg by mouth daily. TAKE 1 TABLET(60 MG) BY MOUTH DAILY AFTER SUPPER) 90 tablet 1  . memantine (NAMENDA) 10 MG tablet Take 10 mg by  mouth 2 (two) times daily.    . metoprolol tartrate (LOPRESSOR) 25 MG tablet Take 25 mg by mouth as needed.    . nitroGLYCERIN (NITROSTAT) 0.4 MG SL tablet Place 1 tablet (0.4 mg total) under the tongue every 5 (five) minutes x 3 doses as needed for chest pain. 30 tablet 3  . pantoprazole (PROTONIX) 40 MG tablet Take 40 mg by mouth every other day.   4  . potassium chloride (MICRO-K) 10 MEQ CR capsule TAKE 1 CAPSULE BY MOUTH DAILY WITH LASIX (Patient taking differently: Take 10 mEq by mouth daily.) 90 capsule 1  . predniSONE (DELTASONE) 1 MG tablet Take 5 mg by mouth daily with breakfast.    . PRESCRIPTION MEDICATION Cpap    . Psyllium Husk POWD Take 1 Scoop by mouth daily.     . Rivaroxaban (XARELTO) 15 MG TABS tablet Take 15 mg by mouth daily with supper.    . rosuvastatin (CRESTOR) 10 MG tablet TAKE 1 TABLET(10 MG) BY MOUTH DAILY (Patient taking differently: Take 10 mg by mouth daily.) 90 tablet  1  . telmisartan (MICARDIS) 40 MG tablet Take 20 mg by mouth daily.     Marland Kitchen triamcinolone cream (KENALOG) 0.1 % Apply 1 application topically as needed (skin irritation).     . vitamin B-12 (CYANOCOBALAMIN) 1000 MCG tablet Take 1,000 mcg by mouth daily.      No current facility-administered medications on file prior to visit.    Radiology:   CT angiogram of the chest 04/29/2017: Stable ascending thoracic aortic aneurysm measuring up to 4.3 cm. Recommend annual imaging followup by CTA or MRA. This recommendation follows 2010 ACCF/AHA/AATS/ACR/ASA/SCA/SCAI/SIR/STS/SVM Guidelines for the Diagnosis and Management of Patients with Thoracic Aortic Disease. Circulation. 2010; 121: e266-e369  Atherosclerotic changes thoracic aorta with irregular plaque/small ulceration thoracic-abdominal aortic junction similar to prior exam.  Atherosclerotic changes origin of great vessels most notable involving the left subclavian artery.  Atherosclerotic changes with narrowing superior mesenteric artery and right renal artery.  Superior aspect of abdominal aortic aneurysm incompletely assessed on the present exam.  Post CABG.  Prominent coronary artery calcification.  Aortic Atherosclerosis (ICD10-I70.0).  Coronary CTA 10/13/2019:  1. Tri- leaflet AV with annular area 540 mm2 upper limit for 26 mm Sapien 3 valve Despite large sinuses commissural calcium may suggest smaller valve better than 29 mm valve  2. Optimum angiographic angle for deployment LAO 16 Caudal 23 degrees  3.  Moderate aortic root dilatation 4.4 cm  4. Coronary arteries sufficient height above annulus for deployment with patent SVG to RCA/OM1 and patent LIMA to LAD  5.  LAA has been clipped with no contrast flow into the appendage  CTA Chest/abd/pelvis 10/13/2019:  Vascular/Lymphatic: Aortic atherosclerosis, with vascular findings and measurements pertinent to potential TAVR procedure, as detailed below. In  addition, there is multifocal aneurysmal dilatation and short segment dissections of both the abdominal aorta and the common iliac arteries bilaterally. Multifocal fusiform aneurysmal dilatation of the infrarenal abdominal aorta measuring up to 3.8 x 3.1 cm proximally and 4.1 x 3.0 cm more distally, with multiple short segment dissection flaps within these regions. Aneurysmal dilatation of the common iliac arteries measuring 1.9 cm in diameter on the right and 1.6 cm in diameter on the left. No lymphadenopathy noted in the abdomen or pelvis.  IMPRESSION: 1. Vascular findings and measurements pertinent to potential TAVR procedure, as detailed above. Please note the presence of multiple aneurysms and short-segment dissections involving the infrarenal abdominal aorta as well as the common  iliac arteries bilaterally. 2. Severe thickening calcification of the aortic valve, compatible with the reported clinical history of severe aortic stenosis. 3. Aortic atherosclerosis, in addition to left main and 3 vessel coronary artery disease. Status post median sternotomy for CABG including LIMA to the LAD. 4. Colonic diverticulosis without evidence of acute diverticulitis at this time. 5. Additional incidental findings, as above.  Cardiac Studies:   Nuclear stress test   11-28-17: 1. Lexiscan stress test with low level exercise was performed. Exercise capacity was not assessed. Stress symptoms included dyspnea. Peak effect blood pressure was 114/66 mmHg. The resting and stress electrocardiogram demonstrated normal sinus rhythm, RBBB, no resting arrhythmias and normal rest repolarization. Stress EKG is non diagnostic for ischemia as it is a pharmacologic stress. 2. The overall quality of the study is good. Left ventricular cavity is noted to be mildly enlarged on the rest and stress studies. Gated SPECT imaging demonstrated mild hypokinesis of the basal inferolateral and mid inferolateral myocardial  wall(s). The left ventricular ejection fraction was calculated or visually estimated to be 69%. Medium sized, medium intensity, fixed perfusion defect in mid to basal inferolateral myocardium suggests old LCx territory infarct with no periinfarct ischemia. 3. Low risk study.   ABI 01/30/2019: Right: Resting right ankle-brachial index indicates moderate right lower extremity arterial disease. The right toe-brachial index is abnormal.  Left: Resting left ankle-brachial index indicates mild left lower extremity arterial disease. The left toe-brachial index is abnormal.   Right and left heart catheterization 09/29/2019: Right heart catheterization RA 8/8, mean 6 mmHg.  RV 34/0, EDP 8 mmHg.;  PA 31/11, mean 18 mmHg.  PA saturation 62%.;  PW 13/25, mean 11 mmHg.  Aortic saturation 100%. Cardiac output 5.57, cardiac index 2.38, normal.  Normal SVR and PVR. LV: 1/4, EDP 14 mmHg.  Peak to peak aortic valve gradient was 41 with a mean gradient of 33.3 with a valve area of 1.09 cm.  Left heart catheterization Left main is calcific 50% to 60% stenosis distally and involves ostium of the large circumflex with a ostial 80% stenosis.  Gives origin to large OM1, OM 2. SVG to OM1 is widely patent and retrogradely fills the entire circumflex.  There is a 70 to 75% stenosis proximal to the graft insertion. LAD: Gives origin to large D1 and D2, mild diffuse disease, subtotally occluded to occluded in the midsegment. LIMA to LAD: Widely patent. RCA: Dominant and occluded in the proximal segment. SVG to RCA: Widely patent.  Impression: Moderate to severe aortic stenosis, symptomatic.  Patient will benefit from aortic valve replacement and consideration for TAVR.  110 mL contrast utilized, fluoroscopy time 42.8 minutes.  TAVR 11/10/2019:  TRANSCATHETER AORTIC VALVE REPLACEMENT, LEFT SUBCLAVIAN USING EDWARDS SAPIEN 3 ULTRA 26 MM THV AORTIC VALVE.  Echocardiogram 03/05/2019:  Mildly depressed LV systolic  function with visual EF 45-50%. Mild left  ventricular hypertrophy. Mild global hypokinesis. No obvious regional wall  motion abnormalities. Left ventricle cavity is normal in size. Elevated  LAP. Calculated EF 46%.  Trileaflet aortic valve. Moderate aortic stenosis. Mild (Grade I) aortic  regurgitation. Thickened and calcified aortic valve leaflets with reduced cusp separation.  Peak velocity 3.52m/s, mean gradient 14mmHg, Dimensional  index 0.24, AVA 1.0cm2, findings suggestive of moderate to severe aortic  stenosis.  Mild (Grade I) mitral regurgitation.  Mild tricuspid regurgitation.  Mild pulmonic regurgitation.  IVC is dilated with a respiratory response of >50%.  Prior study dated 11/2017: LVEF 56%, mild AR, mild AS (PG 95mmHG, MG  26mmHG, AVA 1.26 cm) mild pulmonary hypertension RVSP 32mmHG, remaining valvular heart disease remains stable in comparison.  Cardiac Monitor 11/11/2019 - 12/08/2019:  Sinus rhythm with episodes of atrial flutter vs atrial tachycardia Premature atrial contractions No evidence of high grade AV block or pauses.   PCV ECHOCARDIOGRAM COMPLETE 03/47/4259 Normal LV systolic function with visual EF 60-65%. Left ventricle cavity is normal in size. Mild left ventricular hypertrophy. Normal global wall motion. Doppler evidence of grade III (restrictive) diastolic dysfunction. Left atrial cavity is mildly dilated. Bioprosthetic aortic valve (33mm Sapien valve) well seated, trivial perivalvular regurgitation.  No evidence of aortic stenosis. No aortic valve regurgitation. Mild (Grade I) mitral regurgitation. Mild tricuspid regurgitation. No evidence of pulmonary hypertension. IVC is dilated with a respiratory response of >50%. Compared to prior study dated 11/11/2019 no significant change.  EKG   EKG 12/16/2019: Sinus bradycardia at a rate of 58 bpm.  Left axis, left anterior fascicular block.  Right bundle branch block.  Bifascicular block.  Compared to EKG  11/13/2019, previous nonspecific T wave abnormality of inferior leads not prominent.  EKG 11/13/2019 atrial fibrillation with controlled ventricular response at the rate of 76 bpm, left axis deviation, left anterior fascicular block.  Right bundle branch block.  Nonspecific T wave flattening inferior leads.  Normal eQT interval.   Assessment     ICD-10-CM   1. S/P TAVR (transcatheter aortic valve replacement)  Z95.2 PCV ECHOCARDIOGRAM COMPLETE  2. Paroxysmal atrial fibrillation (HCC)  I48.0   3. Essential hypertension  I10   4. Hypercholesteremia  E78.00    No orders of the defined types were placed in this encounter.   Medications Discontinued During This Encounter  Medication Reason  . methocarbamol (ROBAXIN) 500 MG tablet Error  . metoprolol tartrate (LOPRESSOR) 25 MG tablet Error     This patients CHA2DS2-VASc Score 5 (CHF, HTN, MI, Age) and yearly risk of stroke 7.2%.   NYHA Class II   Recommendations:   [name] [name]  is a  87 y.o. Caucasian male with vitamin B12 deficiency, mild dementia, hyperlipidemia, hypertension, CAD, chronic stable angina status post CABG x 3 and Maze and LAA amputation in 2014, paroxysmal atrial fibrillation, obstructive sleep apnea not currently on CPAP, moderate aortic stenosis with thoracic aortic aneurysm and dilation of ascending aorta and follows Dr. Lilly Cove (No correlation with TTE with regards to aneurysm), venous stasis edema. Patient underwent TAVR 11/10/2019, at which time he presented in atypical atrial flutter but negative chronotropic were avoided due to age, aortic valve disease and bradycardia. He spontaneously converted to sinus, but presented to our office in atrial fibrillation with RVR and acute decompensated heart failure on 11/13/2019.  At this time he was started on increased dose of diuretics as well as amiodarone, since then patient's had no known recurrence of atrial fibrillation.  Patient presents for 79-month follow-up of atrial  fibrillation, heart failure, post TAVR.  There are no clinical signs of heart failure at this time and patient is feeling well overall from a cardiovascular standpoint.  Although he is presently struggling significantly with severe back pain secondary to spinal compression fracture for which he follows with Dr. Dossie Der at [facility].  Patient has had no known recurrence of atrial fibrillation and is tolerating Xarelto without significant bleeding diathesis, will continue amiodarone, Xarelto, and as needed metoprolol titrate.  Blood pressure and lipids remain well controlled, will not make changes to his medications at this time.  Advised patient to continue furosemide 40 mg once daily  with additional as needed dose for volume overload, patient verbalized understanding agreement.  We will continue to monitor BMP and BNP as needed.    Patient continues to have occasional episodes of angina, which is chronic and stable, in fact his use of sublingual nitroglycerin has decreased in frequency.  Suspect anterior bilateral shoulder and chest wall pain to be related to musculoskeletal etiology versus polymyalgia rheumatic.  He will need SBE prophylaxis, which has been prescribed by Quentin Cornwall, [facility]-C [facility].  In view of patient's allergy to amoxicillin she has sent for azithromycin.  Advised patient to have orthopedic office reach out if they need recommendations regarding management of anticoagulation.  Patient will need repeat echo 6 months following TAVR, we will schedule him for this in May and plan to see him afterwards.  Otherwise patient is relatively stable from a cardiovascular standpoint, with primary concern being back pain and orthopedic issues.  Follow-up in 3 months, sooner if needed, for heart failure, atrial fibrillation, status post TAVR.   Alethia Berthold, PA-C 03/15/2020, 12:08 PM Office: [phone]

## 2020-03-21 DIAGNOSIS — Z125 Encounter for screening for malignant neoplasm of prostate: Secondary | ICD-10-CM | POA: Diagnosis not present

## 2020-03-21 DIAGNOSIS — M109 Gout, unspecified: Secondary | ICD-10-CM | POA: Diagnosis not present

## 2020-03-21 DIAGNOSIS — E785 Hyperlipidemia, unspecified: Secondary | ICD-10-CM | POA: Diagnosis not present

## 2020-03-21 DIAGNOSIS — E291 Testicular hypofunction: Secondary | ICD-10-CM | POA: Diagnosis not present

## 2020-03-23 DIAGNOSIS — Z0000 Encounter for general adult medical examination without abnormal findings: Secondary | ICD-10-CM | POA: Diagnosis not present

## 2020-03-23 DIAGNOSIS — R82998 Other abnormal findings in urine: Secondary | ICD-10-CM | POA: Diagnosis not present

## 2020-03-23 DIAGNOSIS — D6869 Other thrombophilia: Secondary | ICD-10-CM | POA: Diagnosis not present

## 2020-03-23 DIAGNOSIS — I129 Hypertensive chronic kidney disease with stage 1 through stage 4 chronic kidney disease, or unspecified chronic kidney disease: Secondary | ICD-10-CM | POA: Diagnosis not present

## 2020-03-23 DIAGNOSIS — E785 Hyperlipidemia, unspecified: Secondary | ICD-10-CM | POA: Diagnosis not present

## 2020-03-23 DIAGNOSIS — D649 Anemia, unspecified: Secondary | ICD-10-CM | POA: Diagnosis not present

## 2020-03-31 DIAGNOSIS — M47816 Spondylosis without myelopathy or radiculopathy, lumbar region: Secondary | ICD-10-CM | POA: Diagnosis not present

## 2020-04-01 ENCOUNTER — Other Ambulatory Visit (HOSPITAL_COMMUNITY): Payer: Self-pay | Admitting: *Deleted

## 2020-04-04 ENCOUNTER — Ambulatory Visit (HOSPITAL_COMMUNITY)
Admission: RE | Admit: 2020-04-04 | Discharge: 2020-04-04 | Disposition: A | Discharge status: Home / Self Care | Payer: Medicare Other | Source: Ambulatory Visit | Attending: Internal Medicine | Admitting: Internal Medicine

## 2020-04-04 ENCOUNTER — Other Ambulatory Visit: Payer: Self-pay

## 2020-04-04 DIAGNOSIS — M810 Age-related osteoporosis without current pathological fracture: Secondary | ICD-10-CM | POA: Diagnosis not present

## 2020-04-04 MED ORDER — DENOSUMAB 60 MG/ML ~~LOC~~ SOSY: 60.0000 mg | PREFILLED_SYRINGE | Freq: Once | SUBCUTANEOUS | Status: AC

## 2020-04-04 MED ORDER — DENOSUMAB 60 MG/ML ~~LOC~~ SOSY
PREFILLED_SYRINGE | SUBCUTANEOUS | Status: AC
  Administered 2020-04-04: 60 mg via SUBCUTANEOUS
  Filled 2020-04-04: qty 1

## 2020-04-04 NOTE — Discharge Instructions (Signed)
Denosumab injection What is this medicine? DENOSUMAB (den oh sue mab) slows bone breakdown. Prolia is used to treat osteoporosis in women after menopause and in men, and in people who are taking corticosteroids for 6 months or more. Xgeva is used to treat a high calcium level due to cancer and to prevent bone fractures and other bone problems caused by multiple myeloma or cancer bone metastases. Xgeva is also used to treat giant cell tumor of the bone. This medicine may be used for other purposes; ask your health care provider or pharmacist if you have questions. COMMON BRAND NAME(S): Prolia, XGEVA What should I tell my health care provider before I take this medicine? They need to know if you have any of these conditions:  dental disease  having surgery or tooth extraction  infection  kidney disease  low levels of calcium or Vitamin D in the blood  malnutrition  on hemodialysis  skin conditions or sensitivity  thyroid or parathyroid disease  an unusual reaction to denosumab, other medicines, foods, dyes, or preservatives  pregnant or trying to get pregnant  breast-feeding How should I use this medicine? This medicine is for injection under the skin. It is given by a health care professional in a hospital or clinic setting. A special MedGuide will be given to you before each treatment. Be sure to read this information carefully each time. For Prolia, talk to your pediatrician regarding the use of this medicine in children. Special care may be needed. For Xgeva, talk to your pediatrician regarding the use of this medicine in children. While this drug may be prescribed for children as young as 13 years for selected conditions, precautions do apply. Overdosage: If you think you have taken too much of this medicine contact a poison control center or emergency room at once. NOTE: This medicine is only for you. Do not share this medicine with others. What if I miss a dose? It is  important not to miss your dose. Call your doctor or health care professional if you are unable to keep an appointment. What may interact with this medicine? Do not take this medicine with any of the following medications:  other medicines containing denosumab This medicine may also interact with the following medications:  medicines that lower your chance of fighting infection  steroid medicines like prednisone or cortisone This list may not describe all possible interactions. Give your health care provider a list of all the medicines, herbs, non-prescription drugs, or dietary supplements you use. Also tell them if you smoke, drink alcohol, or use illegal drugs. Some items may interact with your medicine. What should I watch for while using this medicine? Visit your doctor or health care professional for regular checks on your progress. Your doctor or health care professional may order blood tests and other tests to see how you are doing. Call your doctor or health care professional for advice if you get a fever, chills or sore throat, or other symptoms of a cold or flu. Do not treat yourself. This drug may decrease your body's ability to fight infection. Try to avoid being around people who are sick. You should make sure you get enough calcium and vitamin D while you are taking this medicine, unless your doctor tells you not to. Discuss the foods you eat and the vitamins you take with your health care professional. See your dentist regularly. Brush and floss your teeth as directed. Before you have any dental work done, tell your dentist you are   receiving this medicine. Do not become pregnant while taking this medicine or for 5 months after stopping it. Talk with your doctor or health care professional about your birth control options while taking this medicine. Women should inform their doctor if they wish to become pregnant or think they might be pregnant. There is a potential for serious side  effects to an unborn child. Talk to your health care professional or pharmacist for more information. What side effects may I notice from receiving this medicine? Side effects that you should report to your doctor or health care professional as soon as possible:  allergic reactions like skin rash, itching or hives, swelling of the face, lips, or tongue  bone pain  breathing problems  dizziness  jaw pain, especially after dental work  redness, blistering, peeling of the skin  signs and symptoms of infection like fever or chills; cough; sore throat; pain or trouble passing urine  signs of low calcium like fast heartbeat, muscle cramps or muscle pain; pain, tingling, numbness in the hands or feet; seizures  unusual bleeding or bruising  unusually weak or tired Side effects that usually do not require medical attention (report to your doctor or health care professional if they continue or are bothersome):  constipation  diarrhea  headache  joint pain  loss of appetite  muscle pain  runny nose  tiredness  upset stomach This list may not describe all possible side effects. Call your doctor for medical advice about side effects. You may report side effects to FDA at 1-800-FDA-1088. Where should I keep my medicine? This medicine is only given in a clinic, doctor's office, or other health care setting and will not be stored at home. NOTE: This sheet is a summary. It may not cover all possible information. If you have questions about this medicine, talk to your doctor, pharmacist, or health care provider.  2021 [employer] (2017-04-26 16:10:44)

## 2020-04-11 ENCOUNTER — Ambulatory Visit (INDEPENDENT_AMBULATORY_CARE_PROVIDER_SITE_OTHER): Payer: Medicare Other

## 2020-04-11 DIAGNOSIS — T63441D Toxic effect of venom of bees, accidental (unintentional), subsequent encounter: Secondary | ICD-10-CM

## 2020-04-18 DIAGNOSIS — M5416 Radiculopathy, lumbar region: Secondary | ICD-10-CM | POA: Diagnosis not present

## 2020-05-03 DIAGNOSIS — M2550 Pain in unspecified joint: Secondary | ICD-10-CM | POA: Diagnosis not present

## 2020-05-03 DIAGNOSIS — H538 Other visual disturbances: Secondary | ICD-10-CM | POA: Diagnosis not present

## 2020-05-03 DIAGNOSIS — M545 Low back pain, unspecified: Secondary | ICD-10-CM | POA: Diagnosis not present

## 2020-05-03 DIAGNOSIS — M353 Polymyalgia rheumatica: Secondary | ICD-10-CM | POA: Diagnosis not present

## 2020-05-10 DIAGNOSIS — M5416 Radiculopathy, lumbar region: Secondary | ICD-10-CM | POA: Diagnosis not present

## 2020-05-23 ENCOUNTER — Other Ambulatory Visit: Payer: Self-pay | Admitting: Cardiology

## 2020-05-23 ENCOUNTER — Other Ambulatory Visit: Payer: Self-pay | Admitting: Student

## 2020-05-23 DIAGNOSIS — I5032 Chronic diastolic (congestive) heart failure: Secondary | ICD-10-CM

## 2020-05-24 DIAGNOSIS — M5416 Radiculopathy, lumbar region: Secondary | ICD-10-CM | POA: Diagnosis not present

## 2020-05-24 DIAGNOSIS — R978 Other abnormal tumor markers: Secondary | ICD-10-CM | POA: Diagnosis not present

## 2020-05-24 DIAGNOSIS — M5459 Other low back pain: Secondary | ICD-10-CM | POA: Diagnosis not present

## 2020-05-24 DIAGNOSIS — M545 Low back pain, unspecified: Secondary | ICD-10-CM | POA: Diagnosis not present

## 2020-06-01 ENCOUNTER — Other Ambulatory Visit: Payer: Medicare Other

## 2020-06-01 ENCOUNTER — Other Ambulatory Visit: Payer: Self-pay

## 2020-06-01 MED ORDER — ROSUVASTATIN CALCIUM 10 MG PO TABS: ORAL_TABLET | ORAL | 1 refills | Status: DC

## 2020-06-06 ENCOUNTER — Ambulatory Visit (INDEPENDENT_AMBULATORY_CARE_PROVIDER_SITE_OTHER): Payer: Medicare Other

## 2020-06-06 ENCOUNTER — Other Ambulatory Visit: Payer: Self-pay

## 2020-06-06 DIAGNOSIS — T63441D Toxic effect of venom of bees, accidental (unintentional), subsequent encounter: Secondary | ICD-10-CM | POA: Diagnosis not present

## 2020-06-07 DIAGNOSIS — M0600 Rheumatoid arthritis without rheumatoid factor, unspecified site: Secondary | ICD-10-CM | POA: Diagnosis not present

## 2020-06-07 DIAGNOSIS — R5383 Other fatigue: Secondary | ICD-10-CM | POA: Diagnosis not present

## 2020-06-07 DIAGNOSIS — M109 Gout, unspecified: Secondary | ICD-10-CM | POA: Diagnosis not present

## 2020-06-07 DIAGNOSIS — M545 Low back pain, unspecified: Secondary | ICD-10-CM | POA: Diagnosis not present

## 2020-06-07 DIAGNOSIS — M353 Polymyalgia rheumatica: Secondary | ICD-10-CM | POA: Diagnosis not present

## 2020-06-08 ENCOUNTER — Other Ambulatory Visit: Payer: Self-pay

## 2020-06-08 ENCOUNTER — Ambulatory Visit: Payer: Medicare Other

## 2020-06-08 DIAGNOSIS — R7989 Other specified abnormal findings of blood chemistry: Secondary | ICD-10-CM | POA: Diagnosis not present

## 2020-06-08 DIAGNOSIS — Z952 Presence of prosthetic heart valve: Secondary | ICD-10-CM

## 2020-06-08 DIAGNOSIS — M353 Polymyalgia rheumatica: Secondary | ICD-10-CM | POA: Diagnosis not present

## 2020-06-10 DIAGNOSIS — D6869 Other thrombophilia: Secondary | ICD-10-CM | POA: Diagnosis not present

## 2020-06-10 DIAGNOSIS — F0390 Unspecified dementia without behavioral disturbance: Secondary | ICD-10-CM | POA: Diagnosis not present

## 2020-06-10 DIAGNOSIS — I129 Hypertensive chronic kidney disease with stage 1 through stage 4 chronic kidney disease, or unspecified chronic kidney disease: Secondary | ICD-10-CM | POA: Diagnosis not present

## 2020-06-10 DIAGNOSIS — D649 Anemia, unspecified: Secondary | ICD-10-CM | POA: Diagnosis not present

## 2020-06-14 DIAGNOSIS — N1831 Chronic kidney disease, stage 3a: Secondary | ICD-10-CM | POA: Diagnosis not present

## 2020-06-14 DIAGNOSIS — N179 Acute kidney failure, unspecified: Secondary | ICD-10-CM | POA: Diagnosis not present

## 2020-06-14 DIAGNOSIS — F0390 Unspecified dementia without behavioral disturbance: Secondary | ICD-10-CM | POA: Diagnosis not present

## 2020-06-14 DIAGNOSIS — I129 Hypertensive chronic kidney disease with stage 1 through stage 4 chronic kidney disease, or unspecified chronic kidney disease: Secondary | ICD-10-CM | POA: Diagnosis not present

## 2020-06-14 NOTE — Progress Notes (Signed)
Primary Physician/Referring:  [name], MD  Patient ID: [name] [name], male    DOB: 1926-07-17, 87 y.o.   MRN: [id number]  Chief Complaint  Patient presents with   Atrial Fibrillation   Heart Failure   HPI:    [name] [name]  is a 87 y.o. Caucasian male  with vitamin B12 deficiency, mild dementia, hyperlipidemia, hypertension, CAD, chronic stable angina status post CABG x 3 and Maze and LAA amputation in 2014, paroxysmal atrial fibrillation, obstructive sleep apnea not currently on CPAP, moderate aortic stenosis with thoracic aortic aneurysm and dilation of ascending aorta and follows Dr. Lilly Cove (No correlation with TTE with regards to aneurysm), venous stasis edema. Patient underwent TAVR 11/10/2019, at which time he presented in atypical atrial flutter but negative chronotropic were avoided due to age, aortic valve disease and bradycardia. He spontaneously converted to sinus, but presented to our office in atrial fibrillation and acute decompensated heart failure on 11/13/2019.  At this time he was started on increased dose of diuretics as well as amiodarone, since then patient's had no known recurrence of atrial fibrillation.  Patient presents for 85-monthfollow-up of heart failure, atrial fibrillation, status post TAVR.  Patient's primary concern today is that over the last few months he feels he has slowly been declining.  Patient expresses worsening fatigue and generalized weakness as well as worsening shortness of breath.  He is now using a wheelchair as his primary mode of getting around, which he was not doing previous office visit.  He has also noticed occasional episodes of brief intermittent chest pain for which she continues to take nitroglycerin, and this is at baseline.  Patient is presently tolerating anticoagulation without bleeding diathesis, denies hematuria, hematochezia, melena.  He was recently seen by rheumatology provider who ordered  several labs and noted AST to be  mildly elevated at 43.  Have therefore advised patient to discontinue Crestor at that time.  Patient's creatinine on 06/07/2020 was elevated compared to previous that 1.77, this was again checked by PCP on 06/10/2020 and creatinine had increased to 2.1, therefore patient's PCP discontinued allopurinol and telmisartan.  Repeat check on 6/14 showed creatinine remained at 2.1.   Since last visit patient also underwent 655-monthost-TAVR echocardiogram which was without significant change compared to previous.  He continues to follow with [facility] for pain management as well.   Past Medical History:  Diagnosis Date   AAA (abdominal aortic aneurysm) ([facility]   Acute coronary syndrome ([facility]/05/29/4130 Acute systolic heart failure ([facility]3/03/2012   Angina    Arthritis    Ascending aorta dilatation ([facility]3/13/2014   Fusiform dilatation of the ascending thoracic aorta discovered during surgery, max diameter 4.2-4.3 cm   Atrial fibrillation (HCC) 02/29/2012   Recurrent paroxysmal, new-onset   BPH (benign prostatic hypertrophy)    CAD (coronary artery disease)    Cath April 2012 40% left main, 50% LAD, 40% OM, occluded RCA with L-R collaterals    CHF (congestive heart failure) (HCC)    Chronic venous insufficiency    Dyspnea    GERD (gastroesophageal reflux disease)    Glaucoma    Gout    Headache    MIGRAINES in the past   History of kidney stones    Hyperlipidemia    Hypertension    MI (myocardial infarction) ([facility]   Obesity (BMI 30-39.9)    Paroxysmal atrial fibrillation (HCC) 02/29/2012   Recurrent paroxysmal, new-onset    PMR (polymyalgia  rheumatica) (HCC)    Pre-diabetes    S/P CABG x 3 03/06/2012   LIMA to LAD, SVG to OM, SVG to RCA, EVH via right thigh   S/P Maze operation for atrial fibrillation 03/06/2012   Complete bilateral lesions set using bipolar radiofrequency and cryothermy ablation with clipping of LA appendage   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    s/p TAVR with a 26 mm Edwards via the left subclavian by Drs Buena Irish and Bartle   Severe aortic stenosis 09/29/2019   Sleep apnea    USES CPAP NIGHTLY   Past Surgical History:  Procedure Laterality Date   CARDIAC CATHETERIZATION     CATARACT EXTRACTION, BILATERAL     with lens implants   CHOLECYSTECTOMY N/A 03/24/2012   Procedure: LAPAROSCOPIC CHOLECYSTECTOMY WITH INTRAOPERATIVE CHOLANGIOGRAM;  Surgeon: Zenovia Jarred, MD;  Location: [place];  Service: General;  Laterality: N/A;   CORONARY ARTERY BYPASS GRAFT N/A 03/06/2012   Procedure: CORONARY ARTERY BYPASS GRAFTING (CABG);  Surgeon: Rexene Alberts, MD;  Location: [place];  Service: Open Heart Surgery;  Laterality: N/A;   ENDOVEIN HARVEST OF GREATER SAPHENOUS VEIN Right 03/06/2012   Procedure: ENDOVEIN HARVEST OF GREATER SAPHENOUS VEIN;  Surgeon: Rexene Alberts, MD;  Location: [place];  Service: Open Heart Surgery;  Laterality: Right;   INTRAOPERATIVE TRANSESOPHAGEAL ECHOCARDIOGRAM N/A 03/06/2012   Procedure: INTRAOPERATIVE TRANSESOPHAGEAL ECHOCARDIOGRAM;  Surgeon: Rexene Alberts, MD;  Location: [place];  Service: Open Heart Surgery;  Laterality: N/A;   IR KYPHO LUMBAR INC FX REDUCE BONE BX UNI/BIL CANNULATION INC/IMAGING  02/23/2020   LEFT HEART CATH Right 03/02/2012   Procedure: LEFT HEART CATH;  Surgeon: Sherren Mocha, MD;  Location: [facility] CATH LAB;  Service: Cardiovascular;  Laterality: Right;   MAZE N/A 03/06/2012   Procedure: MAZE;  Surgeon: Rexene Alberts, MD;  Location: [place];  Service: Open Heart Surgery;  Laterality: N/A;   PROSTATECTOMY     partial   RIGHT/LEFT HEART CATH AND CORONARY/GRAFT ANGIOGRAPHY N/A 09/29/2019   Procedure: RIGHT/LEFT HEART CATH AND CORONARY/GRAFT ANGIOGRAPHY;  Surgeon: Adrian Prows, MD;  Location: [place] CV LAB;  Service: Cardiovascular;  Laterality: N/A;   TEE WITHOUT CARDIOVERSION N/A 11/10/2019   Procedure: TRANSESOPHAGEAL ECHOCARDIOGRAM (TEE);  Surgeon: Burnell Blanks, MD;  Location: [place];  Service:  Open Heart Surgery;  Laterality: N/A;   TRANSCATHETER AORTIC VALVE REPLACEMENT, TRANSFEMORAL  11/10/2019   Family History  Problem Relation Age of Onset   Dementia Mother    Stroke Brother     Social History   Tobacco Use   Smoking status: Former    Packs/day: 0.50    Years: 15.00    Pack years: 7.50    Types: Cigarettes    Quit date: 11/18/1962    Years since quitting: 57.6   Smokeless tobacco: Never  Substance Use Topics   Alcohol use: Yes    Alcohol/week: 2.0 standard drinks    Types: 2 Shots of liquor per week    Comment: daily   ROS  Review of Systems  Constitutional: Positive for malaise/fatigue. Negative for weight gain.  Cardiovascular:  Positive for chest pain (infrequent, at baseline), dyspnea on exertion and leg swelling (chronic, stable). Negative for claudication, irregular heartbeat, near-syncope, orthopnea, palpitations, paroxysmal nocturnal dyspnea and syncope.  Hematologic/Lymphatic: Does not bruise/bleed easily.  Musculoskeletal:  Positive for arthritis (mild and stable), back pain, joint pain and myalgias (recent diagnosis of PMR).  Gastrointestinal:  Negative for constipation and melena.  Neurological:  Negative for dizziness and weakness.  Objective  Blood pressure (!) 116/53, pulse 74, height _0  (1.854 m), weight 214 lb (97.1 kg), SpO2 98 %.  Vitals with BMI 06/15/2020 04/04/2020 03/15/2020  Height _1  - _2   Weight 214 lbs - 236 lbs 10 oz  BMI 19.75 - 88.32  Systolic 549 826 415  Diastolic 53 56 64  Pulse 74 71 71     Physical Exam Vitals reviewed.  Constitutional:      General: He is not in acute distress.    Appearance: He is well-developed.     Comments: In wheelchair  Cardiovascular:     Rate and Rhythm: Normal rate and regular rhythm.     Pulses:          Carotid pulses are 2+ on the right side and 2+ on the left side.      Radial pulses are 2+ on the right side and 2+ on the left side.       Femoral pulses are 2+ on the right side  and 2+ on the left side.      Popliteal pulses are 2+ on the right side and 2+ on the left side.       Dorsalis pedis pulses are 0 on the right side and 1+ on the left side.       Posterior tibial pulses are 0 on the right side and 0 on the left side.     Heart sounds: Murmur heard.  Early systolic murmur is present with a grade of 2/6 at the upper right sternal border. S1 is normal, S2 is muffled.    No gallop.     Comments: Compression stockings in place No JVD.  Pulmonary:     Effort: Pulmonary effort is normal.     Breath sounds: No rhonchi or rales.  Musculoskeletal:     Right lower leg: Edema (1+ pitting, improved compared to previous) present.     Left lower leg: Edema (1+ pitting, improved compared to previous) present.  Skin:    General: Skin is warm and dry.  Neurological:     Mental Status: He is alert.   Laboratory examination:   Recent Labs    10/09/19 1151 10/13/19 0838 11/19/19 1410 12/18/19 1124 02/23/20 1135  NA 141   < > 139 139 138  K 4.1   < > 4.3 4.1 4.8  CL 103   < > 100 100 102  CO2 23   < > _3 GLUCOSE 115*   < > 105* 119* 95  BUN 25   < > 29 21 30*  CREATININE 1.33*   < > 1.52* 1.77* 1.69*  CALCIUM 10.0   < > 9.7 9.9 10.1  GFRNONAA 46*   < > 39* 33* 38*  GFRAA 54*  --  46* 38*  --    < > = values in this interval not displayed.   CrCl cannot be calculated (Patient's most recent lab result is older than the maximum 21 days allowed.).  CMP Latest Ref Rng & Units 02/23/2020 12/18/2019 11/19/2019  Glucose 70 - 99 mg/dL 95 119(H) 105(H)  BUN 8 - 23 mg/dL 30(H) 21 29  Creatinine 0.61 - 1.24 mg/dL 1.69(H) 1.77(H) 1.52(H)  Sodium 135 - 145 mmol/L 138 139 139  Potassium 3.5 - 5.1 mmol/L 4.8 4.1 4.3  Chloride 98 - 111 mmol/L 102 100 100  CO2 22 - 32 mmol/L _4 Calcium 8.9 -  10.3 mg/dL 10.1 9.9 9.7  Total Protein 6.5 - 8.1 g/dL - - -  Total Bilirubin 0.3 - 1.2 mg/dL - - -  Alkaline Phos 38 - 126 U/L - - -  AST 15 - 41 U/L - - -  ALT 0 -  44 U/L - - -   CBC Latest Ref Rng & Units 11/11/2019 11/10/2019 11/10/2019  WBC 4.0 - 10.5 K/uL 7.0 - -  Hemoglobin 13.0 - 17.0 g/dL 9.6(L) 10.5(L) 9.9(L)  Hematocrit 39.0 - 52.0 % 30.3(L) 31.0(L) 29.0(L)  Platelets 150 - 400 K/uL 128(L) - -   TSH Recent Labs    09/23/19 1341  TSH 1.950    External labs:  06/14/2020: Glucose 210, BUN 30, creatinine 2.1, GFR 29.7, sodium 134, potassium 4.9,  06/10/2020:  Glucose 5, BUN 40, creatinine 2.1, GFR 29.7, sodium 131, potassium 5.2, AST 20, ALT 27, alk phos 90  06/07/2020: Hemoglobin 9.6, hematocrit 29.9, MCV 95, platelet 168 Sed rate 56, CRP 197 ALT 23, AST 43, alk phos 105, 135, potassium 4.4, creatinine 1.77, BUN 30 Will, GFR 36 A1c 6.0%  Cholesterol, total 115.000 m 01/19/2019 HDL 47 MG/DL 01/19/2019 LDL 51.000 mg 01/19/2019 Triglycerides 84.000 01/19/2019  A1C 5.600 % 01/19/2019; TSH 2.320 01/19/2019  Hemoglobin 10.900 g/ 02/06/2019; INR 1.900 09/11/2018 Platelets 182.000 09/11/2018  Creatinine, Serum 1.300 mg/ 02/06/2019 Potassium 4.100 09/11/2018 Magnesium N/D ALT (SGPT) 15.000 uni 01/19/2019 Allergies   Allergies  Allergen Reactions   Amoxicillin Other (See Comments)    Headache, debilitated   Bee Venom Anaphylaxis   Morphine And Related Other (See Comments)    hypotension      Medications Prior to Visit:   Outpatient Medications Prior to Visit  Medication Sig Dispense Refill   acetaminophen (TYLENOL) 500 MG tablet Take 500 mg by mouth in the morning and at bedtime.     amiodarone (PACERONE) 200 MG tablet Take 1 tablet (200 mg total) by mouth daily. 90 tablet 3   amLODipine (NORVASC) 10 MG tablet Take 10 mg by mouth daily.     azithromycin (ZITHROMAX) 500 MG tablet Take one tablet (500 mg) one hour prior to all dental visits. (Patient taking differently: Take by mouth See admin instructions. Take one tablet (500 mg) one hour prior to all dental visits.) 3 tablet 5   cholecalciferol (VITAMIN D3) 25 MCG (1000 UNIT) tablet Take  1,000 Units by mouth daily.      cyclobenzaprine (FLEXERIL) 10 MG tablet Take 10 mg by mouth 3 (three) times daily as needed for muscle spasms.     EPINEPHrine 0.3 mg/0.3 mL IJ SOAJ injection Inject 0.3 mLs (0.3 mg total) into the muscle as needed for anaphylaxis. 2 each 2   ezetimibe (ZETIA) 10 MG tablet Take 1 tablet (10 mg total) by mouth daily. 30 tablet 6   fish oil-omega-3 fatty acids 1000 MG capsule Take 1 g by mouth daily.      fluticasone (FLONASE) 50 MCG/ACT nasal spray Place into both nostrils daily.     furosemide (LASIX) 40 MG tablet TAKE 1 TABLET(40 MG) BY MOUTH TWICE DAILY AT 10:00AM AND AT 4:00PM (Patient taking differently: Take 40 mg by mouth daily.) 180 tablet 0   gabapentin (NEURONTIN) 100 MG capsule Take 100 mg by mouth 3 (three) times daily.     galantamine (RAZADYNE ER) 8 MG 24 hr capsule Take 8 mg by mouth Daily.     HONEY BEE VENOM IJ Inject 2 each as directed See admin instructions. Every eight weeks  HYDROcodone-acetaminophen (NORCO/VICODIN) 5-325 MG tablet Take 1 tablet by mouth every 6 (six) hours as needed for severe pain.     isosorbide mononitrate (IMDUR) 60 MG 24 hr tablet TAKE 1 TABLET(60 MG) BY MOUTH DAILY AFTER SUPPER 90 tablet 1   leflunomide (ARAVA) 20 MG tablet Take 20 mg by mouth daily.     magnesium gluconate (MAGONATE) 500 MG tablet Take 500 mg by mouth 2 (two) times daily.     memantine (NAMENDA) 10 MG tablet Take 10 mg by mouth 2 (two) times daily.     metoprolol tartrate (LOPRESSOR) 25 MG tablet Take 25 mg by mouth as needed.     nitroGLYCERIN (NITROSTAT) 0.4 MG SL tablet Place 1 tablet (0.4 mg total) under the tongue every 5 (five) minutes x 3 doses as needed for chest pain. 30 tablet 3   pantoprazole (PROTONIX) 40 MG tablet Take 40 mg by mouth every other day.   4   potassium chloride (MICRO-K) 10 MEQ CR capsule TAKE 1 CAPSULE BY MOUTH DAILY WITH LASIX (Patient taking differently: Take 10 mEq by mouth daily.) 90 capsule 1   predniSONE (DELTASONE) 1  MG tablet Take 5 mg by mouth daily with breakfast.     PRESCRIPTION MEDICATION Cpap     Psyllium Husk POWD Take 1 Scoop by mouth daily.      Rivaroxaban (XARELTO) 15 MG TABS tablet Take 15 mg by mouth daily with supper.     triamcinolone cream (KENALOG) 0.1 % Apply 1 application topically as needed (skin irritation).      vitamin B-12 (CYANOCOBALAMIN) 1000 MCG tablet Take 1,000 mcg by mouth daily.      NON FORMULARY      allopurinol (ZYLOPRIM) 300 MG tablet Take 150 mg by mouth Daily.     amLODipine (NORVASC) 5 MG tablet TAKE 1 TABLET BY MOUTH DAILY (Patient taking differently: Take 5 mg by mouth daily.) 90 tablet 3   rosuvastatin (CRESTOR) 10 MG tablet TAKE 1 TABLET(10 MG) BY MOUTH DAILY 90 tablet 1   telmisartan (MICARDIS) 40 MG tablet Take 20 mg by mouth daily.      No facility-administered medications prior to visit.     Final Medications at End of Visit    Current Meds  Medication Sig   acetaminophen (TYLENOL) 500 MG tablet Take 500 mg by mouth in the morning and at bedtime.   amiodarone (PACERONE) 200 MG tablet Take 1 tablet (200 mg total) by mouth daily.   amLODipine (NORVASC) 10 MG tablet Take 10 mg by mouth daily.   azithromycin (ZITHROMAX) 500 MG tablet Take one tablet (500 mg) one hour prior to all dental visits. (Patient taking differently: Take by mouth See admin instructions. Take one tablet (500 mg) one hour prior to all dental visits.)   cholecalciferol (VITAMIN D3) 25 MCG (1000 UNIT) tablet Take 1,000 Units by mouth daily.    cyclobenzaprine (FLEXERIL) 10 MG tablet Take 10 mg by mouth 3 (three) times daily as needed for muscle spasms.   EPINEPHrine 0.3 mg/0.3 mL IJ SOAJ injection Inject 0.3 mLs (0.3 mg total) into the muscle as needed for anaphylaxis.   ezetimibe (ZETIA) 10 MG tablet Take 1 tablet (10 mg total) by mouth daily.   fish oil-omega-3 fatty acids 1000 MG capsule Take 1 g by mouth daily.    fluticasone (FLONASE) 50 MCG/ACT nasal spray Place into both nostrils  daily.   furosemide (LASIX) 40 MG tablet TAKE 1 TABLET(40 MG) BY MOUTH TWICE DAILY AT 10:00AM AND  AT 4:00PM (Patient taking differently: Take 40 mg by mouth daily.)   gabapentin (NEURONTIN) 100 MG capsule Take 100 mg by mouth 3 (three) times daily.   galantamine (RAZADYNE ER) 8 MG 24 hr capsule Take 8 mg by mouth Daily.   HONEY BEE VENOM IJ Inject 2 each as directed See admin instructions. Every eight weeks   HYDROcodone-acetaminophen (NORCO/VICODIN) 5-325 MG tablet Take 1 tablet by mouth every 6 (six) hours as needed for severe pain.   isosorbide mononitrate (IMDUR) 60 MG 24 hr tablet TAKE 1 TABLET(60 MG) BY MOUTH DAILY AFTER SUPPER   leflunomide (ARAVA) 20 MG tablet Take 20 mg by mouth daily.   magnesium gluconate (MAGONATE) 500 MG tablet Take 500 mg by mouth 2 (two) times daily.   memantine (NAMENDA) 10 MG tablet Take 10 mg by mouth 2 (two) times daily.   metoprolol tartrate (LOPRESSOR) 25 MG tablet Take 25 mg by mouth as needed.   nitroGLYCERIN (NITROSTAT) 0.4 MG SL tablet Place 1 tablet (0.4 mg total) under the tongue every 5 (five) minutes x 3 doses as needed for chest pain.   pantoprazole (PROTONIX) 40 MG tablet Take 40 mg by mouth every other day.    potassium chloride (MICRO-K) 10 MEQ CR capsule TAKE 1 CAPSULE BY MOUTH DAILY WITH LASIX (Patient taking differently: Take 10 mEq by mouth daily.)   predniSONE (DELTASONE) 1 MG tablet Take 5 mg by mouth daily with breakfast.   PRESCRIPTION MEDICATION Cpap   Psyllium Husk POWD Take 1 Scoop by mouth daily.    Rivaroxaban (XARELTO) 15 MG TABS tablet Take 15 mg by mouth daily with supper.   triamcinolone cream (KENALOG) 0.1 % Apply 1 application topically as needed (skin irritation).    vitamin B-12 (CYANOCOBALAMIN) 1000 MCG tablet Take 1,000 mcg by mouth daily.    [DISCONTINUED] NON FORMULARY     Radiology:   CT angiogram of the chest 04/29/2017: Stable ascending thoracic aortic aneurysm measuring up to 4.3 cm. Recommend annual imaging  followup by CTA or MRA. This recommendation follows 2010 ACCF/AHA/AATS/ACR/ASA/SCA/SCAI/SIR/STS/SVM Guidelines for the Diagnosis and Management of Patients with Thoracic Aortic Disease. Circulation. 2010; 121: e266-e369   Atherosclerotic changes thoracic aorta with irregular plaque/small ulceration thoracic-abdominal aortic junction similar to prior exam.   Atherosclerotic changes origin of great vessels most notable involving the left subclavian artery.   Atherosclerotic changes with narrowing superior mesenteric artery and right renal artery.   Superior aspect of abdominal aortic aneurysm incompletely assessed on the present exam.   Post CABG.  Prominent coronary artery calcification.   Aortic Atherosclerosis (ICD10-I70.0).  Coronary CTA 10/13/2019:  1. Tri- leaflet AV with annular area 540 mm2 upper limit for 26 mm Sapien 3 valve Despite large sinuses commissural calcium may suggest smaller valve better than 29 mm valve   2. Optimum angiographic angle for deployment LAO 16 Caudal 23 degrees   3.  Moderate aortic root dilatation 4.4 cm   4. Coronary arteries sufficient height above annulus for deployment with patent SVG to RCA/OM1 and patent LIMA to LAD   5.  LAA has been clipped with no contrast flow into the appendage  CTA Chest/abd/pelvis 10/13/2019:  Vascular/Lymphatic: Aortic atherosclerosis, with vascular findings and measurements pertinent to potential TAVR procedure, as detailed below. In addition, there is multifocal aneurysmal dilatation and short segment dissections of both the abdominal aorta and the common iliac arteries bilaterally. Multifocal fusiform aneurysmal dilatation of the infrarenal abdominal aorta measuring up to 3.8 x 3.1 cm proximally and 4.1 x  3.0 cm more distally, with multiple short segment dissection flaps within these regions. Aneurysmal dilatation of the common iliac arteries measuring 1.9 cm in diameter on the right and 1.6 cm in  diameter on the left. No lymphadenopathy noted in the abdomen or pelvis.  IMPRESSION: 1. Vascular findings and measurements pertinent to potential TAVR procedure, as detailed above. Please note the presence of multiple aneurysms and short-segment dissections involving the infrarenal abdominal aorta as well as the common iliac arteries bilaterally. 2. Severe thickening calcification of the aortic valve, compatible with the reported clinical history of severe aortic stenosis. 3. Aortic atherosclerosis, in addition to left main and 3 vessel coronary artery disease. Status post median sternotomy for CABG including LIMA to the LAD. 4. Colonic diverticulosis without evidence of acute diverticulitis at this time. 5. Additional incidental findings, as above.  Cardiac Studies:   Nuclear stress test   12-15-17: 1. Lexiscan stress test with low level exercise was performed. Exercise capacity was not assessed. Stress symptoms included dyspnea. Peak effect blood pressure was 114/66 mmHg. The resting and stress electrocardiogram demonstrated normal sinus rhythm, RBBB, no resting arrhythmias and normal rest repolarization. Stress EKG is non diagnostic for ischemia as it is a pharmacologic stress. 2. The overall quality of the study is good. Left ventricular cavity is noted to be mildly enlarged on the rest and stress studies. Gated SPECT imaging demonstrated mild hypokinesis of the basal inferolateral and mid inferolateral myocardial wall(s). The left ventricular ejection fraction was calculated or visually estimated to be 69%. Medium sized, medium intensity, fixed perfusion defect in mid to basal inferolateral myocardium suggests old LCx territory infarct with no periinfarct ischemia. 3. Low risk study.   ABI 01/30/2019: Right: Resting right ankle-brachial index indicates moderate right lower extremity arterial disease. The right toe-brachial index is abnormal.  Left: Resting left ankle-brachial index  indicates mild left lower extremity arterial disease. The left toe-brachial index is abnormal.   Right and left heart catheterization 09/29/2019: Right heart catheterization RA 8/8, mean 6 mmHg.  RV 34/0, EDP 8 mmHg.;  PA 31/11, mean 18 mmHg.  PA saturation 62%.;  PW 13/25, mean 11 mmHg.  Aortic saturation 100%. Cardiac output 5.57, cardiac index 2.38, normal.  Normal SVR and PVR. LV: 1/4, EDP 14 mmHg.  Peak to peak aortic valve gradient was 41 with a mean gradient of 33.3 with a valve area of 1.09 cm.   Left heart catheterization Left main is calcific 50% to 60% stenosis distally and involves ostium of the large circumflex with a ostial 80% stenosis.  Gives origin to large OM1, OM 2. SVG to OM1 is widely patent and retrogradely fills the entire circumflex.  There is a 70 to 75% stenosis proximal to the graft insertion. LAD: Gives origin to large D1 and D2, mild diffuse disease, subtotally occluded to occluded in the midsegment. LIMA to LAD: Widely patent. RCA: Dominant and occluded in the proximal segment. SVG to RCA: Widely patent.   Impression: Moderate to severe aortic stenosis, symptomatic.  Patient will benefit from aortic valve replacement and consideration for TAVR.  110 mL contrast utilized, fluoroscopy time 42.8 minutes.  TAVR 11/10/2019:  TRANSCATHETER AORTIC VALVE REPLACEMENT, LEFT SUBCLAVIAN USING EDWARDS SAPIEN 3 ULTRA 26 MM THV AORTIC VALVE.  Cardiac Monitor 11/11/2019 - 12/08/2019:  Sinus rhythm with episodes of atrial flutter vs atrial tachycardia Premature atrial contractions No evidence of high grade AV block or pauses.   Echocardiogram 06/08/2020:  Left ventricle cavity is normal in size. Moderate concentric hypertrophy  of the left ventricle. Normal global wall motion. Normal LV systolic  function with EF 64%. Doppler evidence of grade II (pseudonormal)  diastolic dysfunction, elevated LAP.  Well seated bioprosthetic aortic valve (24m Sapien) with trivial   perivalvular regurgitation. Mean pG 11 mmHg which is unchanged from prior  and likely normal post TAVR.  No evidence of pulmonary hypertension.  No significant change compared to previous study on 12/08/2019.   EKG   06/15/2020: Sinus rhythm at a rate of 81 bpm.  Left axis.  Right bundle branch block. Bifascicular block.   12/16/2019: Sinus bradycardia at a rate of 58 bpm.  Left axis, left anterior fascicular block.  Right bundle branch block.  Bifascicular block.  Compared to EKG 11/13/2019, previous nonspecific T wave abnormality of inferior leads not prominent.  11/13/2019 atrial fibrillation with controlled ventricular response at the rate of 76 bpm, left axis deviation, left anterior fascicular block.  Right bundle branch block.  Nonspecific T wave flattening inferior leads.  Normal eQT interval.   Assessment     ICD-10-CM   1. Paroxysmal atrial fibrillation (HCC)  I48.0 EKG 12-Lead    2. Essential hypertension  I10     3. S/P TAVR (transcatheter aortic valve replacement)  Z95.2     4. Chronic diastolic CHF (congestive heart failure) (HCC) - NYHA class II  I50.32 Brain natriuretic peptide    Basic metabolic panel    5. Other fatigue  R53.83     6. Shortness of breath  R06.02      No orders of the defined types were placed in this encounter.   Medications Discontinued During This Encounter  Medication Reason   amLODipine (NORVASC) 5 MG tablet Dose change   telmisartan (MICARDIS) 40 MG tablet Side effect (s)   allopurinol (ZYLOPRIM) 300 MG tablet Side effect (s)   NON FORMULARY Error   rosuvastatin (CRESTOR) 10 MG tablet Side effect (s)     This patients CHA2DS2-VASc Score 5 (CHF, HTN, MI, Age) and yearly risk of stroke 7.2%.   NYHA Class II   Recommendations:   [name] [name] is a  87y.o. Caucasian male  with vitamin B12 deficiency, mild dementia, hyperlipidemia, hypertension, CAD, chronic stable angina status post CABG x 3 and Maze and LAA amputation in 2014,  paroxysmal atrial fibrillation, obstructive sleep apnea not currently on CPAP, moderate aortic stenosis with thoracic aortic aneurysm and dilation of ascending aorta and follows Dr. CLilly Cove(No correlation with TTE with regards to aneurysm), venous stasis edema. Patient underwent TAVR 11/10/2019, at which time he presented in atypical atrial flutter but negative chronotropic were avoided due to age, aortic valve disease and bradycardia. He spontaneously converted to sinus, but presented to our office in atrial fibrillation with RVR and acute decompensated heart failure on 11/13/2019.  At this time he was started on increased dose of diuretics as well as amiodarone, since then patient's had no known recurrence of atrial fibrillation.  Patient presents for 324-monthollow-up of heart failure, atrial fibrillation, status post TAVR.  Patient continues to tolerate anticoagulation without bleeding diathesis, therefore we will continue Xarelto.  Blood pressure remains well controlled.  Regard to patient's deconditioning over the last few months we will obtain BNP and repeat BMP to further evaluate for underlying heart failure as etiology of symptoms.  Reviewed and discussed with patient echocardiogram, details above.  Although echocardiogram does reveal continued grade 2 diastolic dysfunction, it is relatively unchanged compared to previous and therefore do not suspect  symptoms are significantly related to underlying valvular disease and diastolic heart failure, however cannot be ruled out.  Further recommendations pending lab results. Also advised patient to continue to follow closely with PCP as well particularly given recent deterioration of renal function.   He will need SBE prophylaxis. In view of patient's allergy to amoxicillin she has sent for azithromycin.  Follow up in 2 weeks sooner if needed, for fatigue and deconditioning.    Alethia Berthold, PA-C 06/15/2020, 3:50 PM Office: [phone]

## 2020-06-15 ENCOUNTER — Encounter: Payer: Self-pay | Admitting: Student

## 2020-06-15 ENCOUNTER — Other Ambulatory Visit: Payer: Self-pay

## 2020-06-15 ENCOUNTER — Ambulatory Visit: Payer: Medicare Other | Admitting: Student

## 2020-06-15 VITALS — BP 116/53 | HR 74 | Ht 73.0 in | Wt 214.0 lb

## 2020-06-15 DIAGNOSIS — I10 Essential (primary) hypertension: Secondary | ICD-10-CM

## 2020-06-15 DIAGNOSIS — R0602 Shortness of breath: Secondary | ICD-10-CM

## 2020-06-15 DIAGNOSIS — I480 Paroxysmal atrial fibrillation: Secondary | ICD-10-CM

## 2020-06-15 DIAGNOSIS — Z952 Presence of prosthetic heart valve: Secondary | ICD-10-CM

## 2020-06-15 DIAGNOSIS — I5032 Chronic diastolic (congestive) heart failure: Secondary | ICD-10-CM | POA: Diagnosis not present

## 2020-06-15 DIAGNOSIS — R5383 Other fatigue: Secondary | ICD-10-CM

## 2020-06-16 DIAGNOSIS — I5032 Chronic diastolic (congestive) heart failure: Secondary | ICD-10-CM | POA: Diagnosis not present

## 2020-06-17 LAB — BASIC METABOLIC PANEL
BUN/Creatinine Ratio: 23 (ref 10–24)
BUN: 38 mg/dL — ABNORMAL HIGH (ref 10–36)
CO2: 22 mmol/L (ref 20–29)
Calcium: 10.2 mg/dL (ref 8.6–10.2)
Chloride: 96 mmol/L (ref 96–106)
Creatinine, Ser: 1.65 mg/dL — ABNORMAL HIGH (ref 0.76–1.27)
Glucose: 187 mg/dL — ABNORMAL HIGH (ref 65–99)
Potassium: 4.9 mmol/L (ref 3.5–5.2)
Sodium: 136 mmol/L (ref 134–144)
eGFR: 39 mL/min/{1.73_m2} — ABNORMAL LOW (ref >59)

## 2020-06-17 LAB — BRAIN NATRIURETIC PEPTIDE: BNP: 92.6 pg/mL (ref 0.0–100.0)

## 2020-06-20 DIAGNOSIS — E785 Hyperlipidemia, unspecified: Secondary | ICD-10-CM | POA: Diagnosis not present

## 2020-06-20 DIAGNOSIS — Z125 Encounter for screening for malignant neoplasm of prostate: Secondary | ICD-10-CM | POA: Diagnosis not present

## 2020-06-22 DIAGNOSIS — M545 Low back pain, unspecified: Secondary | ICD-10-CM | POA: Diagnosis not present

## 2020-06-27 DIAGNOSIS — D6869 Other thrombophilia: Secondary | ICD-10-CM | POA: Diagnosis not present

## 2020-06-27 DIAGNOSIS — M5416 Radiculopathy, lumbar region: Secondary | ICD-10-CM | POA: Diagnosis not present

## 2020-06-27 DIAGNOSIS — D649 Anemia, unspecified: Secondary | ICD-10-CM | POA: Diagnosis not present

## 2020-06-27 DIAGNOSIS — F0390 Unspecified dementia without behavioral disturbance: Secondary | ICD-10-CM | POA: Diagnosis not present

## 2020-06-27 DIAGNOSIS — E538 Deficiency of other specified B group vitamins: Secondary | ICD-10-CM | POA: Diagnosis not present

## 2020-06-28 NOTE — Progress Notes (Signed)
Primary Physician/Referring:  [name], MD  Patient ID: [name] [name], male    DOB: 1926-08-04, 87 y.o.   MRN: [id number]  Chief Complaint  Patient presents with   Paroxysmal atrial fibrillation    Hypertension   Follow-up   HPI:    [name] [name]  is a 87 y.o. Caucasian male  with vitamin B12 deficiency, mild dementia, hyperlipidemia, hypertension, CAD, chronic stable angina status post CABG x 3 and Maze and LAA amputation in 2014, paroxysmal atrial fibrillation, obstructive sleep apnea not currently on CPAP, moderate aortic stenosis with thoracic aortic aneurysm and dilation of ascending aorta and follows Dr. Lilly Cove (No correlation with TTE with regards to aneurysm), venous stasis edema. Patient underwent TAVR 11/10/2019, at which time he presented in atypical atrial flutter but negative chronotropic were avoided due to age, aortic valve disease and bradycardia. He spontaneously converted to sinus, but presented to our office in atrial fibrillation and acute decompensated heart failure on 11/13/2019.  At this time he was started on increased dose of diuretics as well as amiodarone, since then patient's had no known recurrence of atrial fibrillation.  Patient presents for 2-week follow-up of fatigue and deconditioning.  At last visit obtained BNP and BMP to further evaluate if heart failure was contributing to fatigue and deconditioning.  BNP was normal and BMP stable compared to previous.  Patient states that since last visit his fatigue and shortness of breath have improved, in fact he is now able to take several steps without his walker.  He is continue to follow with Dr. Haynes Kerns who is monitoring anemia unfortunately records are not available for review, however patient's wife states that hemoglobin is improving.  He does note a single episode of hematuria approximately 4 months ago.  He is scheduled to begin physical therapy this coming week, plan is to proceed with water therapy.  Patient  denies chest pain, dizziness, syncope, near syncope.  He is tolerating anticoagulation without bleeding diathesis at this time.  Also according to patient's wife renal function is improving since discontinuation of statin therapy, Zetia, and allopurinol.  Also since last visit patient's chronic back pain has improved, he continues to follow closely with orthopedics.  He also follows closely with rheumatology for management of polymyalgia rheumatica.   Past Medical History:  Diagnosis Date   AAA (abdominal aortic aneurysm) ([place])    Acute coronary syndrome ([place]) 4/49/6759   Acute systolic heart failure ([place]) 03/03/2012   Angina    Arthritis    Ascending aorta dilatation ([place]) 03/13/2012   Fusiform dilatation of the ascending thoracic aorta discovered during surgery, max diameter 4.2-4.3 cm   Atrial fibrillation (HCC) 02/29/2012   Recurrent paroxysmal, new-onset   BPH (benign prostatic hypertrophy)    CAD (coronary artery disease)    Cath April 2012 40% left main, 50% LAD, 40% OM, occluded RCA with L-R collaterals    CHF (congestive heart failure) (HCC)    Chronic venous insufficiency    Dyspnea    GERD (gastroesophageal reflux disease)    Glaucoma    Gout    Headache    MIGRAINES in the past   History of kidney stones    Hyperlipidemia    Hypertension    MI (myocardial infarction) ([place])    Obesity (BMI 30-39.9)    Paroxysmal atrial fibrillation (HCC) 02/29/2012   Recurrent paroxysmal, new-onset    PMR (polymyalgia rheumatica) (HCC)    Pre-diabetes    S/P CABG x 3 03/06/2012  LIMA to LAD, SVG to OM, SVG to RCA, EVH via right thigh   S/P Maze operation for atrial fibrillation 03/06/2012   Complete bilateral lesions set using bipolar radiofrequency and cryothermy ablation with clipping of LA appendage   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   s/p TAVR with a 26 mm Edwards via the left subclavian by Drs Buena Irish and Bartle   Severe aortic stenosis 09/29/2019   Sleep  apnea    USES CPAP NIGHTLY   Past Surgical History:  Procedure Laterality Date   CARDIAC CATHETERIZATION     CATARACT EXTRACTION, BILATERAL     with lens implants   CHOLECYSTECTOMY N/A 03/24/2012   Procedure: LAPAROSCOPIC CHOLECYSTECTOMY WITH INTRAOPERATIVE CHOLANGIOGRAM;  Surgeon: Zenovia Jarred, MD;  Location: [facility];  Service: General;  Laterality: N/A;   CORONARY ARTERY BYPASS GRAFT N/A 03/06/2012   Procedure: CORONARY ARTERY BYPASS GRAFTING (CABG);  Surgeon: Rexene Alberts, MD;  Location: [facility];  Service: Open Heart Surgery;  Laterality: N/A;   ENDOVEIN HARVEST OF GREATER SAPHENOUS VEIN Right 03/06/2012   Procedure: ENDOVEIN HARVEST OF GREATER SAPHENOUS VEIN;  Surgeon: Rexene Alberts, MD;  Location: [facility];  Service: Open Heart Surgery;  Laterality: Right;   INTRAOPERATIVE TRANSESOPHAGEAL ECHOCARDIOGRAM N/A 03/06/2012   Procedure: INTRAOPERATIVE TRANSESOPHAGEAL ECHOCARDIOGRAM;  Surgeon: Rexene Alberts, MD;  Location: [facility];  Service: Open Heart Surgery;  Laterality: N/A;   IR KYPHO LUMBAR INC FX REDUCE BONE BX UNI/BIL CANNULATION INC/IMAGING  02/23/2020   LEFT HEART CATH Right 03/02/2012   Procedure: LEFT HEART CATH;  Surgeon: Sherren Mocha, MD;  Location: [facility] CATH LAB;  Service: Cardiovascular;  Laterality: Right;   MAZE N/A 03/06/2012   Procedure: MAZE;  Surgeon: Rexene Alberts, MD;  Location: [facility];  Service: Open Heart Surgery;  Laterality: N/A;   PROSTATECTOMY     partial   RIGHT/LEFT HEART CATH AND CORONARY/GRAFT ANGIOGRAPHY N/A 09/29/2019   Procedure: RIGHT/LEFT HEART CATH AND CORONARY/GRAFT ANGIOGRAPHY;  Surgeon: Adrian Prows, MD;  Location: [facility] CV LAB;  Service: Cardiovascular;  Laterality: N/A;   TEE WITHOUT CARDIOVERSION N/A 11/10/2019   Procedure: TRANSESOPHAGEAL ECHOCARDIOGRAM (TEE);  Surgeon: Burnell Blanks, MD;  Location: [facility];  Service: Open Heart Surgery;  Laterality: N/A;   TRANSCATHETER AORTIC VALVE REPLACEMENT, TRANSFEMORAL  11/10/2019   Family History   Problem Relation Age of Onset   Dementia Mother    Stroke Brother     Social History   Tobacco Use   Smoking status: Former    Packs/day: 0.50    Years: 15.00    Pack years: 7.50    Types: Cigarettes    Quit date: 11/18/1962    Years since quitting: 57.6   Smokeless tobacco: Never  Substance Use Topics   Alcohol use: Yes    Alcohol/week: 2.0 standard drinks    Types: 2 Shots of liquor per week    Comment: daily   ROS  Review of Systems  Constitutional: Positive for malaise/fatigue (improving). Negative for weight gain.  Cardiovascular:  Positive for dyspnea on exertion (improved) and leg swelling (chronic, stable). Negative for chest pain, claudication, irregular heartbeat, near-syncope, orthopnea, palpitations, paroxysmal nocturnal dyspnea and syncope.  Hematologic/Lymphatic: Does not bruise/bleed easily.  Musculoskeletal:  Positive for arthritis (mild and stable), back pain, joint pain and myalgias (recent diagnosis of PMR).  Gastrointestinal:  Negative for constipation and melena.  Neurological:  Negative for dizziness and weakness.  Objective  Blood pressure (!) 147/67, pulse 76, resp. rate 16,  height _0  (1.854 m), weight 222 lb (100.7 kg), SpO2 95 %.  Vitals with BMI 06/29/2020 06/15/2020 04/04/2020  Height _1  _2  -  Weight 222 lbs 214 lbs -  BMI 27.0 35.00 -  Systolic 938 182 993  Diastolic 67 53 56  Pulse 76 74 71     Physical Exam Vitals reviewed.  Constitutional:      General: He is not in acute distress.    Appearance: He is well-developed.     Comments: Using walker.  Patient appears more alert and energetic compared to last visit.  Cardiovascular:     Rate and Rhythm: Normal rate and regular rhythm.     Pulses:          Carotid pulses are 2+ on the right side and 2+ on the left side.      Radial pulses are 2+ on the right side and 2+ on the left side.       Femoral pulses are 2+ on the right side and 2+ on the left side.      Popliteal pulses are  2+ on the right side and 2+ on the left side.       Dorsalis pedis pulses are 0 on the right side and 1+ on the left side.       Posterior tibial pulses are 0 on the right side and 0 on the left side.     Heart sounds: Murmur heard.  Early systolic murmur is present with a grade of 2/6 at the upper right sternal border. S1 is normal, S2 is muffled.    No gallop.     Comments: No JVD Pulmonary:     Effort: Pulmonary effort is normal.     Breath sounds: No rhonchi or rales.  Musculoskeletal:     Right lower leg: Edema (1+ pitting, improved compared to previous) present.     Left lower leg: Edema (1+ pitting, improved compared to previous) present.  Skin:    General: Skin is warm and dry.  Neurological:     Mental Status: He is alert.   Laboratory examination:   Recent Labs    10/09/19 1151 10/13/19 0838 11/19/19 1410 12/18/19 1124 02/23/20 1135 06/16/20 1607  NA 141   < > 139 139 138 136  K 4.1   < > 4.3 4.1 4.8 4.9  CL 103   < > 100 100 102 96  CO2 23   < > _3 GLUCOSE 115*   < > 105* 119* 95 187*  BUN 25   < > 29 21 30* 38*  CREATININE 1.33*   < > 1.52* 1.77* 1.69* 1.65*  CALCIUM 10.0   < > 9.7 9.9 10.1 10.2  GFRNONAA 46*   < > 39* 33* 38*  --   GFRAA 54*  --  46* 38*  --   --    < > = values in this interval not displayed.   estimated creatinine clearance is 35.6 mL/min (A) (by C-G formula based on SCr of 1.65 mg/dL (H)).  CMP Latest Ref Rng & Units 06/16/2020 02/23/2020 12/18/2019  Glucose 65 - 99 mg/dL 187(H) 95 119(H)  BUN 10 - 36 mg/dL 38(H) 30(H) 21  Creatinine 0.76 - 1.27 mg/dL 1.65(H) 1.69(H) 1.77(H)  Sodium 134 - 144 mmol/L 136 138 139  Potassium 3.5 - 5.2 mmol/L 4.9 4.8 4.1  Chloride 96 - 106 mmol/L 96 102 100  CO2 20 - 29 mmol/L 22  26 25  Calcium 8.6 - 10.2 mg/dL 10.2 10.1 9.9  Total Protein 6.5 - 8.1 g/dL - - -  Total Bilirubin 0.3 - 1.2 mg/dL - - -  Alkaline Phos 38 - 126 U/L - - -  AST 15 - 41 U/L - - -  ALT 0 - 44 U/L - - -   CBC Latest  Ref Rng & Units 11/11/2019 11/10/2019 11/10/2019  WBC 4.0 - 10.5 K/uL 7.0 - -  Hemoglobin 13.0 - 17.0 g/dL 9.6(L) 10.5(L) 9.9(L)  Hematocrit 39.0 - 52.0 % 30.3(L) 31.0(L) 29.0(L)  Platelets 150 - 400 K/uL 128(L) - -   TSH Recent Labs    09/23/19 1341  TSH 1.950   BNP    Component Value Date/Time   BNP 92.6 06/16/2020 1607   BNP 175.4 (H) 11/06/2019 0926    ProBNP    Component Value Date/Time   PROBNP 185 08/29/2018 1218   PROBNP 431.9 06/25/2012 1420     External labs:  06/14/2020: Glucose 210, BUN 30, creatinine 2.1, GFR 29.7, sodium 134, potassium 4.9,  06/10/2020:  Glucose 5, BUN 40, creatinine 2.1, GFR 29.7, sodium 131, potassium 5.2, AST 20, ALT 27, alk phos 90  06/07/2020: Hemoglobin 9.6, hematocrit 29.9, MCV 95, platelet 168 Sed rate 56, CRP 197 ALT 23, AST 43, alk phos 105, 135, potassium 4.4, creatinine 1.77, BUN 30 Will, GFR 36 A1c 6.0%  Cholesterol, total 115.000 m 01/19/2019 HDL 47 MG/DL 01/19/2019 LDL 51.000 mg 01/19/2019 Triglycerides 84.000 01/19/2019  A1C 5.600 % 01/19/2019; TSH 2.320 01/19/2019  Hemoglobin 10.900 g/ 02/06/2019; INR 1.900 09/11/2018 Platelets 182.000 09/11/2018  Creatinine, Serum 1.300 mg/ 02/06/2019 Potassium 4.100 09/11/2018 Magnesium N/D ALT (SGPT) 15.000 uni 01/19/2019 Allergies   Allergies  Allergen Reactions   Amoxicillin Other (See Comments)    Headache, debilitated   Bee Venom Anaphylaxis   Morphine And Related Other (See Comments)    hypotension    Medications Prior to Visit:   Outpatient Medications Prior to Visit  Medication Sig Dispense Refill   acetaminophen (TYLENOL) 500 MG tablet Take 500 mg by mouth in the morning and at bedtime.     amiodarone (PACERONE) 200 MG tablet Take 1 tablet (200 mg total) by mouth daily. 90 tablet 3   amLODipine (NORVASC) 10 MG tablet Take 10 mg by mouth daily.     azithromycin (ZITHROMAX) 500 MG tablet Take one tablet (500 mg) one hour prior to all dental visits. (Patient taking differently:  Take by mouth See admin instructions. Take one tablet (500 mg) one hour prior to all dental visits.) 3 tablet 5   cholecalciferol (VITAMIN D3) 25 MCG (1000 UNIT) tablet Take 1,000 Units by mouth daily.      cyclobenzaprine (FLEXERIL) 10 MG tablet Take 10 mg by mouth 3 (three) times daily as needed for muscle spasms.     EPINEPHrine 0.3 mg/0.3 mL IJ SOAJ injection Inject 0.3 mLs (0.3 mg total) into the muscle as needed for anaphylaxis. 2 each 2   fish oil-omega-3 fatty acids 1000 MG capsule Take 1 g by mouth daily.      fluticasone (FLONASE) 50 MCG/ACT nasal spray Place into both nostrils daily.     furosemide (LASIX) 40 MG tablet TAKE 1 TABLET(40 MG) BY MOUTH TWICE DAILY AT 10:00AM AND AT 4:00PM (Patient taking differently: Take 40 mg by mouth daily.) 180 tablet 0   gabapentin (NEURONTIN) 100 MG capsule Take 100 mg by mouth 3 (three) times daily.     galantamine (RAZADYNE ER) 8  MG 24 hr capsule Take 8 mg by mouth Daily.     HONEY BEE VENOM IJ Inject 2 each as directed See admin instructions. Every eight weeks     HYDROcodone-acetaminophen (NORCO/VICODIN) 5-325 MG tablet Take 1 tablet by mouth every 6 (six) hours as needed for severe pain.     isosorbide mononitrate (IMDUR) 60 MG 24 hr tablet TAKE 1 TABLET(60 MG) BY MOUTH DAILY AFTER SUPPER 90 tablet 1   leflunomide (ARAVA) 20 MG tablet Take 20 mg by mouth daily.     magnesium gluconate (MAGONATE) 500 MG tablet Take 500 mg by mouth 2 (two) times daily.     memantine (NAMENDA) 10 MG tablet Take 10 mg by mouth 2 (two) times daily.     nitroGLYCERIN (NITROSTAT) 0.4 MG SL tablet Place 1 tablet (0.4 mg total) under the tongue every 5 (five) minutes x 3 doses as needed for chest pain. 30 tablet 3   pantoprazole (PROTONIX) 40 MG tablet Take 40 mg by mouth every other day.   4   potassium chloride (MICRO-K) 10 MEQ CR capsule TAKE 1 CAPSULE BY MOUTH DAILY WITH LASIX (Patient taking differently: Take 10 mEq by mouth daily.) 90 capsule 1   predniSONE  (DELTASONE) 1 MG tablet Take 5 mg by mouth daily with breakfast.     PRESCRIPTION MEDICATION Cpap     Psyllium Husk POWD Take 1 Scoop by mouth daily.      Rivaroxaban (XARELTO) 15 MG TABS tablet Take 15 mg by mouth daily with supper.     triamcinolone cream (KENALOG) 0.1 % Apply 1 application topically as needed (skin irritation).      vitamin B-12 (CYANOCOBALAMIN) 1000 MCG tablet Take 1,000 mcg by mouth daily.      ezetimibe (ZETIA) 10 MG tablet Take 1 tablet (10 mg total) by mouth daily. 30 tablet 6   metoprolol tartrate (LOPRESSOR) 25 MG tablet Take 25 mg by mouth as needed.     No facility-administered medications prior to visit.   Final Medications at End of Visit    Current Meds  Medication Sig   acetaminophen (TYLENOL) 500 MG tablet Take 500 mg by mouth in the morning and at bedtime.   amiodarone (PACERONE) 200 MG tablet Take 1 tablet (200 mg total) by mouth daily.   amLODipine (NORVASC) 10 MG tablet Take 10 mg by mouth daily.   azithromycin (ZITHROMAX) 500 MG tablet Take one tablet (500 mg) one hour prior to all dental visits. (Patient taking differently: Take by mouth See admin instructions. Take one tablet (500 mg) one hour prior to all dental visits.)   cholecalciferol (VITAMIN D3) 25 MCG (1000 UNIT) tablet Take 1,000 Units by mouth daily.    cyclobenzaprine (FLEXERIL) 10 MG tablet Take 10 mg by mouth 3 (three) times daily as needed for muscle spasms.   EPINEPHrine 0.3 mg/0.3 mL IJ SOAJ injection Inject 0.3 mLs (0.3 mg total) into the muscle as needed for anaphylaxis.   fish oil-omega-3 fatty acids 1000 MG capsule Take 1 g by mouth daily.    fluticasone (FLONASE) 50 MCG/ACT nasal spray Place into both nostrils daily.   furosemide (LASIX) 40 MG tablet TAKE 1 TABLET(40 MG) BY MOUTH TWICE DAILY AT 10:00AM AND AT 4:00PM (Patient taking differently: Take 40 mg by mouth daily.)   gabapentin (NEURONTIN) 100 MG capsule Take 100 mg by mouth 3 (three) times daily.   galantamine (RAZADYNE  ER) 8 MG 24 hr capsule Take 8 mg by mouth Daily.   HONEY  BEE VENOM IJ Inject 2 each as directed See admin instructions. Every eight weeks   HYDROcodone-acetaminophen (NORCO/VICODIN) 5-325 MG tablet Take 1 tablet by mouth every 6 (six) hours as needed for severe pain.   isosorbide mononitrate (IMDUR) 60 MG 24 hr tablet TAKE 1 TABLET(60 MG) BY MOUTH DAILY AFTER SUPPER   leflunomide (ARAVA) 20 MG tablet Take 20 mg by mouth daily.   magnesium gluconate (MAGONATE) 500 MG tablet Take 500 mg by mouth 2 (two) times daily.   memantine (NAMENDA) 10 MG tablet Take 10 mg by mouth 2 (two) times daily.   nitroGLYCERIN (NITROSTAT) 0.4 MG SL tablet Place 1 tablet (0.4 mg total) under the tongue every 5 (five) minutes x 3 doses as needed for chest pain.   pantoprazole (PROTONIX) 40 MG tablet Take 40 mg by mouth every other day.    potassium chloride (MICRO-K) 10 MEQ CR capsule TAKE 1 CAPSULE BY MOUTH DAILY WITH LASIX (Patient taking differently: Take 10 mEq by mouth daily.)   predniSONE (DELTASONE) 1 MG tablet Take 5 mg by mouth daily with breakfast.   PRESCRIPTION MEDICATION Cpap   Psyllium Husk POWD Take 1 Scoop by mouth daily.    Rivaroxaban (XARELTO) 15 MG TABS tablet Take 15 mg by mouth daily with supper.   triamcinolone cream (KENALOG) 0.1 % Apply 1 application topically as needed (skin irritation).    vitamin B-12 (CYANOCOBALAMIN) 1000 MCG tablet Take 1,000 mcg by mouth daily.     Radiology:   CT angiogram of the chest 04/29/2017: Stable ascending thoracic aortic aneurysm measuring up to 4.3 cm. Recommend annual imaging followup by CTA or MRA. This recommendation follows 2010 ACCF/AHA/AATS/ACR/ASA/SCA/SCAI/SIR/STS/SVM Guidelines for the Diagnosis and Management of Patients with Thoracic Aortic Disease. Circulation. 2010; 121: e266-e369   Atherosclerotic changes thoracic aorta with irregular plaque/small ulceration thoracic-abdominal aortic junction similar to prior exam.   Atherosclerotic changes  origin of great vessels most notable involving the left subclavian artery.   Atherosclerotic changes with narrowing superior mesenteric artery and right renal artery.   Superior aspect of abdominal aortic aneurysm incompletely assessed on the present exam.   Post CABG.  Prominent coronary artery calcification.   Aortic Atherosclerosis (ICD10-I70.0).  Coronary CTA 10/13/2019:  1. Tri- leaflet AV with annular area 540 mm2 upper limit for 26 mm Sapien 3 valve Despite large sinuses commissural calcium may suggest smaller valve better than 29 mm valve   2. Optimum angiographic angle for deployment LAO 16 Caudal 23 degrees   3.  Moderate aortic root dilatation 4.4 cm   4. Coronary arteries sufficient height above annulus for deployment with patent SVG to RCA/OM1 and patent LIMA to LAD   5.  LAA has been clipped with no contrast flow into the appendage  CTA Chest/abd/pelvis 10/13/2019:  Vascular/Lymphatic: Aortic atherosclerosis, with vascular findings and measurements pertinent to potential TAVR procedure, as detailed below. In addition, there is multifocal aneurysmal dilatation and short segment dissections of both the abdominal aorta and the common iliac arteries bilaterally. Multifocal fusiform aneurysmal dilatation of the infrarenal abdominal aorta measuring up to 3.8 x 3.1 cm proximally and 4.1 x 3.0 cm more distally, with multiple short segment dissection flaps within these regions. Aneurysmal dilatation of the common iliac arteries measuring 1.9 cm in diameter on the right and 1.6 cm in diameter on the left. No lymphadenopathy noted in the abdomen or pelvis.  IMPRESSION: 1. Vascular findings and measurements pertinent to potential TAVR procedure, as detailed above. Please note the presence of multiple aneurysms and  short-segment dissections involving the infrarenal abdominal aorta as well as the common iliac arteries bilaterally. 2. Severe thickening calcification of  the aortic valve, compatible with the reported clinical history of severe aortic stenosis. 3. Aortic atherosclerosis, in addition to left main and 3 vessel coronary artery disease. Status post median sternotomy for CABG including LIMA to the LAD. 4. Colonic diverticulosis without evidence of acute diverticulitis at this time. 5. Additional incidental findings, as above.  Cardiac Studies:   Nuclear stress test   11-25-17: 1. Lexiscan stress test with low level exercise was performed. Exercise capacity was not assessed. Stress symptoms included dyspnea. Peak effect blood pressure was 114/66 mmHg. The resting and stress electrocardiogram demonstrated normal sinus rhythm, RBBB, no resting arrhythmias and normal rest repolarization. Stress EKG is non diagnostic for ischemia as it is a pharmacologic stress. 2. The overall quality of the study is good. Left ventricular cavity is noted to be mildly enlarged on the rest and stress studies. Gated SPECT imaging demonstrated mild hypokinesis of the basal inferolateral and mid inferolateral myocardial wall(s). The left ventricular ejection fraction was calculated or visually estimated to be 69%. Medium sized, medium intensity, fixed perfusion defect in mid to basal inferolateral myocardium suggests old LCx territory infarct with no periinfarct ischemia. 3. Low risk study.   ABI 01/30/2019: Right: Resting right ankle-brachial index indicates moderate right lower extremity arterial disease. The right toe-brachial index is abnormal.  Left: Resting left ankle-brachial index indicates mild left lower extremity arterial disease. The left toe-brachial index is abnormal.   Right and left heart catheterization 09/29/2019: Right heart catheterization RA 8/8, mean 6 mmHg.  RV 34/0, EDP 8 mmHg.;  PA 31/11, mean 18 mmHg.  PA saturation 62%.;  PW 13/25, mean 11 mmHg.  Aortic saturation 100%. Cardiac output 5.57, cardiac index 2.38, normal.  Normal SVR and PVR. LV:  1/4, EDP 14 mmHg.  Peak to peak aortic valve gradient was 41 with a mean gradient of 33.3 with a valve area of 1.09 cm.   Left heart catheterization Left main is calcific 50% to 60% stenosis distally and involves ostium of the large circumflex with a ostial 80% stenosis.  Gives origin to large OM1, OM 2. SVG to OM1 is widely patent and retrogradely fills the entire circumflex.  There is a 70 to 75% stenosis proximal to the graft insertion. LAD: Gives origin to large D1 and D2, mild diffuse disease, subtotally occluded to occluded in the midsegment. LIMA to LAD: Widely patent. RCA: Dominant and occluded in the proximal segment. SVG to RCA: Widely patent.   Impression: Moderate to severe aortic stenosis, symptomatic.  Patient will benefit from aortic valve replacement and consideration for TAVR.  110 mL contrast utilized, fluoroscopy time 42.8 minutes.  TAVR 11/10/2019:  TRANSCATHETER AORTIC VALVE REPLACEMENT, LEFT SUBCLAVIAN USING EDWARDS SAPIEN 3 ULTRA 26 MM THV AORTIC VALVE.  Cardiac Monitor 11/11/2019 - 12/08/2019:  Sinus rhythm with episodes of atrial flutter vs atrial tachycardia Premature atrial contractions No evidence of high grade AV block or pauses.   Echocardiogram 06/08/2020:  Left ventricle cavity is normal in size. Moderate concentric hypertrophy  of the left ventricle. Normal global wall motion. Normal LV systolic  function with EF 64%. Doppler evidence of grade II (pseudonormal)  diastolic dysfunction, elevated LAP.  Well seated bioprosthetic aortic valve (10m Sapien) with trivial  perivalvular regurgitation. Mean pG 11 mmHg which is unchanged from prior  and likely normal post TAVR.  No evidence of pulmonary hypertension.  No significant change compared  to previous study on 12/08/2019.   EKG   06/15/2020: Sinus rhythm at a rate of 81 bpm.  Left axis.  Right bundle branch block. Bifascicular block.   12/16/2019: Sinus bradycardia at a rate of 58 bpm.  Left axis, left  anterior fascicular block.  Right bundle branch block.  Bifascicular block.  Compared to EKG 11/13/2019, previous nonspecific T wave abnormality of inferior leads not prominent.  11/13/2019 atrial fibrillation with controlled ventricular response at the rate of 76 bpm, left axis deviation, left anterior fascicular block.  Right bundle branch block.  Nonspecific T wave flattening inferior leads.  Normal eQT interval.   Assessment     ICD-10-CM   1. Paroxysmal atrial fibrillation (HCC)  I48.0     2. Chronic diastolic CHF (congestive heart failure) (HCC) - NYHA class II  I50.32     3. S/P TAVR (transcatheter aortic valve replacement)  Z95.2      No orders of the defined types were placed in this encounter.   Medications Discontinued During This Encounter  Medication Reason   ezetimibe (ZETIA) 10 MG tablet Error   metoprolol tartrate (LOPRESSOR) 25 MG tablet Error     This patients CHA2DS2-VASc Score 5 (CHF, HTN, MI, Age) and yearly risk of stroke 7.2%.   NYHA Class II   Recommendations:   [name] [name]  is a  87 y.o. Caucasian male  with vitamin B12 deficiency, mild dementia, hyperlipidemia, hypertension, CAD, chronic stable angina status post CABG x 3 and Maze and LAA amputation in 2014, paroxysmal atrial fibrillation, obstructive sleep apnea not currently on CPAP, moderate aortic stenosis with thoracic aortic aneurysm and dilation of ascending aorta and follows Dr. Lilly Cove (No correlation with TTE with regards to aneurysm), venous stasis edema. Patient underwent TAVR 11/10/2019, at which time he presented in atypical atrial flutter but negative chronotropic were avoided due to age, aortic valve disease and bradycardia. He spontaneously converted to sinus, but presented to our office in atrial fibrillation with RVR and acute decompensated heart failure on 11/13/2019.  At this time he was started on increased dose of diuretics as well as amiodarone, since then patient's had no known  recurrence of atrial fibrillation.  Patient presents for 2-week follow-up of fatigue and deconditioning.  At last visit obtained BNP and BMP to further evaluate if heart failure was contributing to fatigue and deconditioning.  BNP was normal and BMP stable compared to previous.  Reviewed and discussed with patient and his wife regarding cardiac testing including echocardiogram, recent lab work, cardiac monitor, and cardiac catheterization from last year.  Cardiovascular work-up has been relatively unyielding as far as underlying etiology of patient's deconditioning.  However given patient's history of atrial fibrillation/flutter cannot exclude recurrent episodes as contributing to his fatigue.  Discussed option of repeating ambulatory cardiac telemetry with patient.  As he is currently scheduled to begin water therapy and has a upcoming trip to the beach he prefers to hold off on monitor placement at this time.  Given that his symptoms are improving we will follow-up after his trip to the beach and reevaluate need for ambulatory cardiac monitor.  Also recommend continued close follow-up with PCP for management of anemia as this is likely also contributing to patient's current presentation.  Patient will need SBE prophylaxis, in view of patient's allergy to amoxicillin, recommend azithromycin.  Follow-up in 8 weeks, sooner if needed, for fatigue.    Alethia Berthold, PA-C 06/29/2020, 1:59 PM Office: [phone]

## 2020-06-29 ENCOUNTER — Ambulatory Visit: Payer: Medicare Other | Admitting: Student

## 2020-06-29 ENCOUNTER — Other Ambulatory Visit: Payer: Self-pay

## 2020-06-29 ENCOUNTER — Encounter: Payer: Self-pay | Admitting: Student

## 2020-06-29 VITALS — BP 147/67 | HR 76 | Resp 16 | Ht 73.0 in | Wt 222.0 lb

## 2020-06-29 DIAGNOSIS — Z952 Presence of prosthetic heart valve: Secondary | ICD-10-CM | POA: Diagnosis not present

## 2020-06-29 DIAGNOSIS — I480 Paroxysmal atrial fibrillation: Secondary | ICD-10-CM

## 2020-06-29 DIAGNOSIS — I5032 Chronic diastolic (congestive) heart failure: Secondary | ICD-10-CM | POA: Diagnosis not present

## 2020-06-30 DIAGNOSIS — M6281 Muscle weakness (generalized): Secondary | ICD-10-CM | POA: Diagnosis not present

## 2020-06-30 DIAGNOSIS — M5451 Vertebrogenic low back pain: Secondary | ICD-10-CM | POA: Diagnosis not present

## 2020-06-30 DIAGNOSIS — R2689 Other abnormalities of gait and mobility: Secondary | ICD-10-CM | POA: Diagnosis not present

## 2020-07-05 DIAGNOSIS — H02831 Dermatochalasis of right upper eyelid: Secondary | ICD-10-CM | POA: Diagnosis not present

## 2020-07-05 DIAGNOSIS — M353 Polymyalgia rheumatica: Secondary | ICD-10-CM | POA: Diagnosis not present

## 2020-07-05 DIAGNOSIS — H02834 Dermatochalasis of left upper eyelid: Secondary | ICD-10-CM | POA: Diagnosis not present

## 2020-07-05 DIAGNOSIS — H40013 Open angle with borderline findings, low risk, bilateral: Secondary | ICD-10-CM | POA: Diagnosis not present

## 2020-07-08 DIAGNOSIS — M5451 Vertebrogenic low back pain: Secondary | ICD-10-CM | POA: Diagnosis not present

## 2020-07-08 DIAGNOSIS — M6281 Muscle weakness (generalized): Secondary | ICD-10-CM | POA: Diagnosis not present

## 2020-07-08 DIAGNOSIS — R2689 Other abnormalities of gait and mobility: Secondary | ICD-10-CM | POA: Diagnosis not present

## 2020-07-11 DIAGNOSIS — B351 Tinea unguium: Secondary | ICD-10-CM | POA: Diagnosis not present

## 2020-07-11 DIAGNOSIS — D229 Melanocytic nevi, unspecified: Secondary | ICD-10-CM | POA: Diagnosis not present

## 2020-07-11 DIAGNOSIS — Z85820 Personal history of malignant melanoma of skin: Secondary | ICD-10-CM | POA: Diagnosis not present

## 2020-07-11 DIAGNOSIS — Z85828 Personal history of other malignant neoplasm of skin: Secondary | ICD-10-CM | POA: Diagnosis not present

## 2020-07-12 DIAGNOSIS — L989 Disorder of the skin and subcutaneous tissue, unspecified: Secondary | ICD-10-CM | POA: Diagnosis not present

## 2020-07-13 DIAGNOSIS — M5451 Vertebrogenic low back pain: Secondary | ICD-10-CM | POA: Diagnosis not present

## 2020-07-13 DIAGNOSIS — M6281 Muscle weakness (generalized): Secondary | ICD-10-CM | POA: Diagnosis not present

## 2020-07-13 DIAGNOSIS — R2689 Other abnormalities of gait and mobility: Secondary | ICD-10-CM | POA: Diagnosis not present

## 2020-07-22 DIAGNOSIS — R2689 Other abnormalities of gait and mobility: Secondary | ICD-10-CM | POA: Diagnosis not present

## 2020-07-22 DIAGNOSIS — M6281 Muscle weakness (generalized): Secondary | ICD-10-CM | POA: Diagnosis not present

## 2020-07-22 DIAGNOSIS — M5451 Vertebrogenic low back pain: Secondary | ICD-10-CM | POA: Diagnosis not present

## 2020-07-25 DIAGNOSIS — R2689 Other abnormalities of gait and mobility: Secondary | ICD-10-CM | POA: Diagnosis not present

## 2020-07-25 DIAGNOSIS — S41111A Laceration without foreign body of right upper arm, initial encounter: Secondary | ICD-10-CM | POA: Diagnosis not present

## 2020-07-25 DIAGNOSIS — M5451 Vertebrogenic low back pain: Secondary | ICD-10-CM | POA: Diagnosis not present

## 2020-07-25 DIAGNOSIS — K5900 Constipation, unspecified: Secondary | ICD-10-CM | POA: Diagnosis not present

## 2020-07-25 DIAGNOSIS — N179 Acute kidney failure, unspecified: Secondary | ICD-10-CM | POA: Diagnosis not present

## 2020-07-25 DIAGNOSIS — M6281 Muscle weakness (generalized): Secondary | ICD-10-CM | POA: Diagnosis not present

## 2020-07-25 DIAGNOSIS — I129 Hypertensive chronic kidney disease with stage 1 through stage 4 chronic kidney disease, or unspecified chronic kidney disease: Secondary | ICD-10-CM | POA: Diagnosis not present

## 2020-07-27 ENCOUNTER — Other Ambulatory Visit: Payer: Self-pay

## 2020-07-27 ENCOUNTER — Ambulatory Visit: Payer: Medicare Other | Admitting: Podiatry

## 2020-07-27 ENCOUNTER — Encounter: Payer: Self-pay | Admitting: Podiatry

## 2020-07-27 DIAGNOSIS — M79674 Pain in right toe(s): Secondary | ICD-10-CM | POA: Diagnosis not present

## 2020-07-27 DIAGNOSIS — B351 Tinea unguium: Secondary | ICD-10-CM

## 2020-07-27 DIAGNOSIS — L84 Corns and callosities: Secondary | ICD-10-CM | POA: Diagnosis not present

## 2020-07-27 DIAGNOSIS — M79675 Pain in left toe(s): Secondary | ICD-10-CM

## 2020-07-27 NOTE — Progress Notes (Signed)
This patient returns to my office for at risk foot care.  This patient requires this care by a professional since this patient will be at risk due to having coagulation defect and chronic venous stasis  B/L.  Patient is taking xarelto.  This patient is unable to cut nails himself since the patient cannot reach his nails.These nails are painful walking and wearing shoes.  He presents to the office today with his daughter.  This patient presents for at risk foot care today.  General Appearance  Alert, conversant and in no acute stress.  Vascular  Dorsalis pedis and posterior tibial  pulses are not palpable due to swelling  bilaterally.  Capillary return is within normal limits  bilaterally. Temperature is diminished  bilaterally.  Neurologic  Senn-Weinstein monofilament wire test within normal limits  bilaterally. Muscle power within normal limits bilaterally.  Nails Thick disfigured discolored nails with subungual debris  from hallux to fifth toes bilaterally. No evidence of bacterial infection or drainage bilaterally.  Orthopedic  No limitations of motion  feet .  No crepitus or effusions noted.  No bony pathology or digital deformities noted.  Skin  normotropic skin with no porokeratosis noted bilaterally.  No signs of infections or ulcers noted.   Ecchymosis right hallux.  Callus formation on posterior aspect right heel.  Occasional morning bleeding noted.  Onychomycosis  Pain in right toes  Pain in left toes  Callus right heel.  Consent was obtained for treatment procedures.   Mechanical debridement of nails 1-5  bilaterally performed with a nail nipper.  Filed with dremel without incident.    Return office visit  3 months                    Told patient to return for periodic foot care and evaluation due to potential at risk complications.  Patient has pre-ulcerous pressure callus right foot.  Told to apply neosporin/bandaod.  Wear soft shoes.  Sleep with heel on a pillow.    Gardiner Barefoot  DPM

## 2020-07-28 ENCOUNTER — Other Ambulatory Visit: Payer: Self-pay

## 2020-07-28 DIAGNOSIS — M5451 Vertebrogenic low back pain: Secondary | ICD-10-CM | POA: Diagnosis not present

## 2020-07-28 DIAGNOSIS — M6281 Muscle weakness (generalized): Secondary | ICD-10-CM | POA: Diagnosis not present

## 2020-07-28 DIAGNOSIS — R2689 Other abnormalities of gait and mobility: Secondary | ICD-10-CM | POA: Diagnosis not present

## 2020-07-28 MED ORDER — AMLODIPINE BESYLATE 10 MG PO TABS: 10.0000 mg | ORAL_TABLET | Freq: Every day | ORAL | 3 refills | Status: DC

## 2020-08-01 ENCOUNTER — Ambulatory Visit: Payer: Self-pay

## 2020-08-05 ENCOUNTER — Other Ambulatory Visit: Payer: Self-pay | Admitting: Physician Assistant

## 2020-08-05 DIAGNOSIS — Z952 Presence of prosthetic heart valve: Secondary | ICD-10-CM

## 2020-08-08 ENCOUNTER — Other Ambulatory Visit: Payer: Self-pay

## 2020-08-08 ENCOUNTER — Ambulatory Visit (INDEPENDENT_AMBULATORY_CARE_PROVIDER_SITE_OTHER): Payer: Medicare Other | Admitting: *Deleted

## 2020-08-08 DIAGNOSIS — T63441D Toxic effect of venom of bees, accidental (unintentional), subsequent encounter: Secondary | ICD-10-CM

## 2020-08-10 DIAGNOSIS — R2689 Other abnormalities of gait and mobility: Secondary | ICD-10-CM | POA: Diagnosis not present

## 2020-08-10 DIAGNOSIS — M5451 Vertebrogenic low back pain: Secondary | ICD-10-CM | POA: Diagnosis not present

## 2020-08-10 DIAGNOSIS — M353 Polymyalgia rheumatica: Secondary | ICD-10-CM | POA: Diagnosis not present

## 2020-08-10 DIAGNOSIS — Z79899 Other long term (current) drug therapy: Secondary | ICD-10-CM | POA: Diagnosis not present

## 2020-08-10 DIAGNOSIS — M6281 Muscle weakness (generalized): Secondary | ICD-10-CM | POA: Diagnosis not present

## 2020-08-10 DIAGNOSIS — M109 Gout, unspecified: Secondary | ICD-10-CM | POA: Diagnosis not present

## 2020-08-10 DIAGNOSIS — M0600 Rheumatoid arthritis without rheumatoid factor, unspecified site: Secondary | ICD-10-CM | POA: Diagnosis not present

## 2020-08-16 NOTE — Progress Notes (Deleted)
Primary Physician/Referring:  [name], MD  Patient ID: [name] [name], male    DOB: [birth year], 87 y.o.   MRN: [id number]  No chief complaint on file.  HPI:    AKASH WINSKI  is a 87 y.o. Caucasian male  with vitamin B12 deficiency, mild dementia, hyperlipidemia, hypertension, CAD, chronic stable angina status post CABG x 3 and Maze and LAA amputation in 2014, paroxysmal atrial fibrillation, obstructive sleep apnea not currently on CPAP, moderate aortic stenosis with thoracic aortic aneurysm and dilation of ascending aorta and follows Dr. Lilly Cove (No correlation with TTE with regards to aneurysm), venous stasis edema. Patient underwent TAVR 11/10/2019, at which time he presented in atypical atrial flutter but negative chronotropic were avoided due to age, aortic valve disease and bradycardia. He spontaneously converted to sinus, but presented to our office in atrial fibrillation and acute decompensated heart failure on 11/13/2019.  At this time he was started on increased dose of diuretics as well as amiodarone, since then patient's had no known recurrence of atrial fibrillation.  Patient presents for 8-week follow-up of fatigue and deconditioning.  Discussed at last office visit they cardiovascular work-up has been relatively unyielding for underlying etiology of patient's symptoms.  Discussed option of ambulatory cardiac monitor, however held off at last visit.***  ***  Patient presents for 2-week follow-up of fatigue and deconditioning.  At last visit obtained BNP and BMP to further evaluate if heart failure was contributing to fatigue and deconditioning.  BNP was normal and BMP stable compared to previous.  Patient states that since last visit his fatigue and shortness of breath have improved, in fact he is now able to take several steps without his walker.  He is continue to follow with Dr. Haynes Kerns who is monitoring anemia unfortunately records are not available for review, however patient's  wife states that hemoglobin is improving.  He does note a single episode of hematuria approximately 4 months ago.  He is scheduled to begin physical therapy this coming week, plan is to proceed with water therapy.  Patient denies chest pain, dizziness, syncope, near syncope.  He is tolerating anticoagulation without bleeding diathesis at this time.  Also according to patient's wife renal function is improving since discontinuation of statin therapy, Zetia, and allopurinol.  Also since last visit patient's chronic back pain has improved, he continues to follow closely with orthopedics.  He also follows closely with rheumatology for management of polymyalgia rheumatica.   Past Medical History:  Diagnosis Date   AAA (abdominal aortic aneurysm) ([place])    Acute coronary syndrome ([place]) 04/09/8117   Acute systolic heart failure ([place]) 03/03/2012   Angina    Arthritis    Ascending aorta dilatation ([place]) 03/13/2012   Fusiform dilatation of the ascending thoracic aorta discovered during surgery, max diameter 4.2-4.3 cm   Atrial fibrillation ([facility]) 02/29/2012   Recurrent paroxysmal, new-onset   BPH (benign prostatic hypertrophy)    CAD (coronary artery disease)    Cath April 2012 40% left main, 50% LAD, 40% OM, occluded RCA with L-R collaterals    CHF (congestive heart failure) ([facility])    Chronic venous insufficiency    Dyspnea    GERD (gastroesophageal reflux disease)    Glaucoma    Gout    Headache    MIGRAINES in the past   History of kidney stones    Hyperlipidemia    Hypertension    MI (myocardial infarction) ([place])    Obesity (BMI 30-39.9)    Paroxysmal  atrial fibrillation ([place]) 02/29/2012   Recurrent paroxysmal, new-onset    PMR (polymyalgia rheumatica) ([facility])    Pre-diabetes    S/P CABG x 3 03/06/2012   LIMA to LAD, SVG to OM, SVG to RCA, EVH via right thigh   S/P Maze operation for atrial fibrillation 03/06/2012   Complete bilateral lesions set using bipolar radiofrequency and cryothermy ablation  with clipping of LA appendage   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   s/p TAVR with a 26 mm Edwards via the left subclavian by Drs Buena Irish and Bartle   Severe aortic stenosis 09/29/2019   Sleep apnea    USES CPAP NIGHTLY   Past Surgical History:  Procedure Laterality Date   CARDIAC CATHETERIZATION     CATARACT EXTRACTION, BILATERAL     with lens implants   CHOLECYSTECTOMY N/A 03/24/2012   Procedure: LAPAROSCOPIC CHOLECYSTECTOMY WITH INTRAOPERATIVE CHOLANGIOGRAM;  Surgeon: Zenovia Jarred, MD;  Location: [facility];  Service: General;  Laterality: N/A;   CORONARY ARTERY BYPASS GRAFT N/A 03/06/2012   Procedure: CORONARY ARTERY BYPASS GRAFTING (CABG);  Surgeon: Rexene Alberts, MD;  Location: [facility];  Service: Open Heart Surgery;  Laterality: N/A;   ENDOVEIN HARVEST OF GREATER SAPHENOUS VEIN Right 03/06/2012   Procedure: ENDOVEIN HARVEST OF GREATER SAPHENOUS VEIN;  Surgeon: Rexene Alberts, MD;  Location: [facility];  Service: Open Heart Surgery;  Laterality: Right;   INTRAOPERATIVE TRANSESOPHAGEAL ECHOCARDIOGRAM N/A 03/06/2012   Procedure: INTRAOPERATIVE TRANSESOPHAGEAL ECHOCARDIOGRAM;  Surgeon: Rexene Alberts, MD;  Location: [facility];  Service: Open Heart Surgery;  Laterality: N/A;   IR KYPHO LUMBAR INC FX REDUCE BONE BX UNI/BIL CANNULATION INC/IMAGING  02/23/2020   LEFT HEART CATH Right 03/02/2012   Procedure: LEFT HEART CATH;  Surgeon: Sherren Mocha, MD;  Location: [facility] CATH LAB;  Service: Cardiovascular;  Laterality: Right;   MAZE N/A 03/06/2012   Procedure: MAZE;  Surgeon: Rexene Alberts, MD;  Location: [facility];  Service: Open Heart Surgery;  Laterality: N/A;   PROSTATECTOMY     partial   RIGHT/LEFT HEART CATH AND CORONARY/GRAFT ANGIOGRAPHY N/A 09/29/2019   Procedure: RIGHT/LEFT HEART CATH AND CORONARY/GRAFT ANGIOGRAPHY;  Surgeon: Adrian Prows, MD;  Location: [facility] LAB;  Service: Cardiovascular;  Laterality: N/A;   TEE WITHOUT CARDIOVERSION N/A 11/10/2019   Procedure:  TRANSESOPHAGEAL ECHOCARDIOGRAM (TEE);  Surgeon: Burnell Blanks, MD;  Location: [facility];  Service: Open Heart Surgery;  Laterality: N/A;   TRANSCATHETER AORTIC VALVE REPLACEMENT, TRANSFEMORAL  11/10/2019   Family History  Problem Relation Age of Onset   Dementia Mother    Stroke Brother     Social History   Tobacco Use   Smoking status: Former    Packs/day: 0.50    Years: 15.00    Pack years: 7.50    Types: Cigarettes    Quit date: 11/18/1962    Years since quitting: 57.7   Smokeless tobacco: Never  Substance Use Topics   Alcohol use: Yes    Alcohol/week: 2.0 standard drinks    Types: 2 Shots of liquor per week    Comment: daily   ROS  Review of Systems  Constitutional: Positive for malaise/fatigue (improving). Negative for weight gain.  Cardiovascular:  Positive for dyspnea on exertion (improved) and leg swelling (chronic, stable). Negative for chest pain, claudication, irregular heartbeat, near-syncope, orthopnea, palpitations, paroxysmal nocturnal dyspnea and syncope.  Hematologic/Lymphatic: Does not bruise/bleed easily.  Musculoskeletal:  Positive for arthritis (mild and stable), back pain, joint pain and myalgias (recent diagnosis  of PMR).  Gastrointestinal:  Negative for constipation and melena.  Neurological:  Negative for dizziness and weakness.  Objective  There were no vitals taken for this visit.  Vitals with BMI 06/29/2020 06/15/2020 04/04/2020  Height '6\' 1"'  '6\' 1"'  -  Weight 222 lbs 214 lbs -  BMI 93.7 16.96 -  Systolic 789 381 017  Diastolic 67 53 56  Pulse 76 74 71     Physical Exam Vitals reviewed.  Constitutional:      General: He is not in acute distress.    Appearance: He is well-developed.     Comments: Using walker.  Patient appears more alert and energetic compared to last visit.  Cardiovascular:     Rate and Rhythm: Normal rate and regular rhythm.     Pulses:          Carotid pulses are 2+ on the right side and 2+ on the left side.       Radial pulses are 2+ on the right side and 2+ on the left side.       Femoral pulses are 2+ on the right side and 2+ on the left side.      Popliteal pulses are 2+ on the right side and 2+ on the left side.       Dorsalis pedis pulses are 0 on the right side and 1+ on the left side.       Posterior tibial pulses are 0 on the right side and 0 on the left side.     Heart sounds: Murmur heard.  Early systolic murmur is present with a grade of 2/6 at the upper right sternal border. S1 is normal, S2 is muffled.    No gallop.     Comments: No JVD Pulmonary:     Effort: Pulmonary effort is normal.     Breath sounds: No rhonchi or rales.  Musculoskeletal:     Right lower leg: Edema (1+ pitting, improved compared to previous) present.     Left lower leg: Edema (1+ pitting, improved compared to previous) present.  Skin:    General: Skin is warm and dry.  Neurological:     Mental Status: He is alert.   Laboratory examination:   Recent Labs    10/09/19 1151 10/13/19 0838 11/19/19 1410 12/18/19 1124 02/23/20 1135 06/16/20 1607  NA 141   < > 139 139 138 136  K 4.1   < > 4.3 4.1 4.8 4.9  CL 103   < > 100 100 102 96  CO2 23   < > '25 25 26 22  ' GLUCOSE 115*   < > 105* 119* 95 187*  BUN 25   < > 29 21 30* 38*  CREATININE 1.33*   < > 1.52* 1.77* 1.69* 1.65*  CALCIUM 10.0   < > 9.7 9.9 10.1 10.2  GFRNONAA 46*   < > 39* 33* 38*  --   GFRAA 54*  --  46* 38*  --   --    < > = values in this interval not displayed.    CrCl cannot be calculated (Patient's most recent lab result is older than the maximum 21 days allowed.).  CMP Latest Ref Rng & Units 06/16/2020 02/23/2020 12/18/2019  Glucose 65 - 99 mg/dL 187(H) 95 119(H)  BUN 10 - 36 mg/dL 38(H) 30(H) 21  Creatinine 0.76 - 1.27 mg/dL 1.65(H) 1.69(H) 1.77(H)  Sodium 134 - 144 mmol/L 136 138 139  Potassium 3.5 - 5.2 mmol/L 4.9 4.8  4.1  Chloride 96 - 106 mmol/L 96 102 100  CO2 20 - 29 mmol/L '22 26 25  ' Calcium 8.6 - 10.2 mg/dL 10.2 10.1 9.9   Total Protein 6.5 - 8.1 g/dL - - -  Total Bilirubin 0.3 - 1.2 mg/dL - - -  Alkaline Phos 38 - 126 U/L - - -  AST 15 - 41 U/L - - -  ALT 0 - 44 U/L - - -   CBC Latest Ref Rng & Units 11/11/2019 11/10/2019 11/10/2019  WBC 4.0 - 10.5 K/uL 7.0 - -  Hemoglobin 13.0 - 17.0 g/dL 9.6(L) 10.5(L) 9.9(L)  Hematocrit 39.0 - 52.0 % 30.3(L) 31.0(L) 29.0(L)  Platelets 150 - 400 K/uL 128(L) - -   TSH Recent Labs    09/23/19 1341  TSH 1.950    BNP    Component Value Date/Time   BNP 92.6 06/16/2020 1607   BNP 175.4 (H) 11/06/2019 0926    ProBNP    Component Value Date/Time   PROBNP 185 08/29/2018 1218   PROBNP 431.9 06/25/2012 1420     External labs:  06/14/2020: Glucose 210, BUN 30, creatinine 2.1, GFR 29.7, sodium 134, potassium 4.9,  06/10/2020:  Glucose 5, BUN 40, creatinine 2.1, GFR 29.7, sodium 131, potassium 5.2, AST 20, ALT 27, alk phos 90  06/07/2020: Hemoglobin 9.6, hematocrit 29.9, MCV 95, platelet 168 Sed rate 56, CRP 197 ALT 23, AST 43, alk phos 105, 135, potassium 4.4, creatinine 1.77, BUN 30 Will, GFR 36 A1c 6.0%  Cholesterol, total 115.000 m 01/19/2019 HDL 47 MG/DL 01/19/2019 LDL 51.000 mg 01/19/2019 Triglycerides 84.000 01/19/2019  A1C 5.600 % 01/19/2019; TSH 2.320 01/19/2019  Hemoglobin 10.900 g/ 02/06/2019; INR 1.900 09/11/2018 Platelets 182.000 09/11/2018  Creatinine, Serum 1.300 mg/ 02/06/2019 Potassium 4.100 09/11/2018 Magnesium N/D ALT (SGPT) 15.000 uni 01/19/2019 Allergies   Allergies  Allergen Reactions   Amoxicillin Other (See Comments)    Headache, debilitated   Bee Venom Anaphylaxis   Morphine And Related Other (See Comments)    hypotension    Medications Prior to Visit:   Outpatient Medications Prior to Visit  Medication Sig Dispense Refill   acetaminophen (TYLENOL) 500 MG tablet Take 500 mg by mouth in the morning and at bedtime.     amiodarone (PACERONE) 200 MG tablet Take 1 tablet (200 mg total) by mouth daily. 90 tablet 3   amLODipine  (NORVASC) 10 MG tablet Take 1 tablet (10 mg total) by mouth daily. 90 tablet 3   azithromycin (ZITHROMAX) 500 MG tablet Take one tablet (500 mg) one hour prior to all dental visits. (Patient taking differently: Take by mouth See admin instructions. Take one tablet (500 mg) one hour prior to all dental visits.) 3 tablet 5   cholecalciferol (VITAMIN D3) 25 MCG (1000 UNIT) tablet Take 1,000 Units by mouth daily.      cyclobenzaprine (FLEXERIL) 10 MG tablet Take 10 mg by mouth 3 (three) times daily as needed for muscle spasms.     EPINEPHrine 0.3 mg/0.3 mL IJ SOAJ injection Inject 0.3 mLs (0.3 mg total) into the muscle as needed for anaphylaxis. 2 each 2   ezetimibe (ZETIA) 10 MG tablet Take 10 mg by mouth daily.     fish oil-omega-3 fatty acids 1000 MG capsule Take 1 g by mouth daily.      fluticasone (FLONASE) 50 MCG/ACT nasal spray Place into both nostrils daily.     furosemide (LASIX) 40 MG tablet TAKE 1 TABLET(40 MG) BY MOUTH TWICE DAILY AT 10:00AM AND AT  4:00PM (Patient taking differently: Take 40 mg by mouth daily.) 180 tablet 0   gabapentin (NEURONTIN) 100 MG capsule Take 100 mg by mouth 3 (three) times daily.     galantamine (RAZADYNE ER) 8 MG 24 hr capsule Take 8 mg by mouth Daily.     HONEY BEE VENOM IJ Inject 2 each as directed See admin instructions. Every eight weeks     HYDROcodone-acetaminophen (NORCO/VICODIN) 5-325 MG tablet Take 1 tablet by mouth every 6 (six) hours as needed for severe pain.     isosorbide mononitrate (IMDUR) 60 MG 24 hr tablet TAKE 1 TABLET(60 MG) BY MOUTH DAILY AFTER SUPPER 90 tablet 1   leflunomide (ARAVA) 20 MG tablet Take 20 mg by mouth daily.     magnesium gluconate (MAGONATE) 500 MG tablet Take 500 mg by mouth 2 (two) times daily.     memantine (NAMENDA) 10 MG tablet Take 10 mg by mouth 2 (two) times daily.     nitroGLYCERIN (NITROSTAT) 0.4 MG SL tablet Place 1 tablet (0.4 mg total) under the tongue every 5 (five) minutes x 3 doses as needed for chest pain. 30  tablet 3   pantoprazole (PROTONIX) 40 MG tablet Take 40 mg by mouth every other day.   4   potassium chloride (MICRO-K) 10 MEQ CR capsule TAKE 1 CAPSULE BY MOUTH DAILY WITH LASIX (Patient taking differently: Take 10 mEq by mouth daily.) 90 capsule 1   predniSONE (DELTASONE) 1 MG tablet Take 5 mg by mouth daily with breakfast.     PRESCRIPTION MEDICATION Cpap     Psyllium Husk POWD Take 1 Scoop by mouth daily.      Rivaroxaban (XARELTO) 15 MG TABS tablet Take 15 mg by mouth daily with supper.     triamcinolone cream (KENALOG) 0.1 % Apply 1 application topically as needed (skin irritation).      vitamin B-12 (CYANOCOBALAMIN) 1000 MCG tablet Take 1,000 mcg by mouth daily.      No facility-administered medications prior to visit.   Final Medications at End of Visit    No outpatient medications have been marked as taking for the 08/17/20 encounter (Appointment) with Rayetta Pigg, Thinh Cuccaro C, PA-C.    Radiology:   CT angiogram of the chest 04/29/2017: Stable ascending thoracic aortic aneurysm measuring up to 4.3 cm. Recommend annual imaging followup by CTA or MRA. This recommendation follows 2010 ACCF/AHA/AATS/ACR/ASA/SCA/SCAI/SIR/STS/SVM Guidelines for the Diagnosis and Management of Patients with Thoracic Aortic Disease. Circulation. 2010; 121: e266-e369   Atherosclerotic changes thoracic aorta with irregular plaque/small ulceration thoracic-abdominal aortic junction similar to prior exam.   Atherosclerotic changes origin of great vessels most notable involving the left subclavian artery.   Atherosclerotic changes with narrowing superior mesenteric artery and right renal artery.   Superior aspect of abdominal aortic aneurysm incompletely assessed on the present exam.   Post CABG.  Prominent coronary artery calcification.   Aortic Atherosclerosis (ICD10-I70.0).  Coronary CTA 10/13/2019:  1. Tri- leaflet AV with annular area 540 mm2 upper limit for 26 mm Sapien 3 valve Despite large  sinuses commissural calcium may suggest smaller valve better than 29 mm valve   2. Optimum angiographic angle for deployment LAO 16 Caudal 23 degrees   3.  Moderate aortic root dilatation 4.4 cm   4. Coronary arteries sufficient height above annulus for deployment with patent SVG to RCA/OM1 and patent LIMA to LAD   5.  LAA has been clipped with no contrast flow into the appendage  CTA Chest/abd/pelvis 10/13/2019:  Vascular/Lymphatic: Aortic  atherosclerosis, with vascular findings and measurements pertinent to potential TAVR procedure, as detailed below. In addition, there is multifocal aneurysmal dilatation and short segment dissections of both the abdominal aorta and the common iliac arteries bilaterally. Multifocal fusiform aneurysmal dilatation of the infrarenal abdominal aorta measuring up to 3.8 x 3.1 cm proximally and 4.1 x 3.0 cm more distally, with multiple short segment dissection flaps within these regions. Aneurysmal dilatation of the common iliac arteries measuring 1.9 cm in diameter on the right and 1.6 cm in diameter on the left. No lymphadenopathy noted in the abdomen or pelvis.  IMPRESSION: 1. Vascular findings and measurements pertinent to potential TAVR procedure, as detailed above. Please note the presence of multiple aneurysms and short-segment dissections involving the infrarenal abdominal aorta as well as the common iliac arteries bilaterally. 2. Severe thickening calcification of the aortic valve, compatible with the reported clinical history of severe aortic stenosis. 3. Aortic atherosclerosis, in addition to left main and 3 vessel coronary artery disease. Status post median sternotomy for CABG including LIMA to the LAD. 4. Colonic diverticulosis without evidence of acute diverticulitis at this time. 5. Additional incidental findings, as above.  Cardiac Studies:   Nuclear stress test   Dec 15, 2017: 1. Lexiscan stress test with low level exercise was  performed. Exercise capacity was not assessed. Stress symptoms included dyspnea. Peak effect blood pressure was 114/66 mmHg. The resting and stress electrocardiogram demonstrated normal sinus rhythm, RBBB, no resting arrhythmias and normal rest repolarization. Stress EKG is non diagnostic for ischemia as it is a pharmacologic stress. 2. The overall quality of the study is good. Left ventricular cavity is noted to be mildly enlarged on the rest and stress studies. Gated SPECT imaging demonstrated mild hypokinesis of the basal inferolateral and mid inferolateral myocardial wall(s). The left ventricular ejection fraction was calculated or visually estimated to be 69%. Medium sized, medium intensity, fixed perfusion defect in mid to basal inferolateral myocardium suggests old LCx territory infarct with no periinfarct ischemia. 3. Low risk study.   ABI 01/30/2019: Right: Resting right ankle-brachial index indicates moderate right lower extremity arterial disease. The right toe-brachial index is abnormal.  Left: Resting left ankle-brachial index indicates mild left lower extremity arterial disease. The left toe-brachial index is abnormal.   Right and left heart catheterization 09/29/2019: Right heart catheterization RA 8/8, mean 6 mmHg.  RV 34/0, EDP 8 mmHg.;  PA 31/11, mean 18 mmHg.  PA saturation 62%.;  PW 13/25, mean 11 mmHg.  Aortic saturation 100%. Cardiac output 5.57, cardiac index 2.38, normal.  Normal SVR and PVR. LV: 1/4, EDP 14 mmHg.  Peak to peak aortic valve gradient was 41 with a mean gradient of 33.3 with a valve area of 1.09 cm.   Left heart catheterization Left main is calcific 50% to 60% stenosis distally and involves ostium of the large circumflex with a ostial 80% stenosis.  Gives origin to large OM1, OM 2. SVG to OM1 is widely patent and retrogradely fills the entire circumflex.  There is a 70 to 75% stenosis proximal to the graft insertion. LAD: Gives origin to large D1 and D2, mild  diffuse disease, subtotally occluded to occluded in the midsegment. LIMA to LAD: Widely patent. RCA: Dominant and occluded in the proximal segment. SVG to RCA: Widely patent.   Impression: Moderate to severe aortic stenosis, symptomatic.  Patient will benefit from aortic valve replacement and consideration for TAVR.  110 mL contrast utilized, fluoroscopy time 42.8 minutes.  TAVR 11/10/2019:  TRANSCATHETER AORTIC VALVE REPLACEMENT,  LEFT SUBCLAVIAN USING EDWARDS SAPIEN 3 ULTRA 26 MM THV AORTIC VALVE.  Cardiac Monitor 11/11/2019 - 12/08/2019:  Sinus rhythm with episodes of atrial flutter vs atrial tachycardia Premature atrial contractions No evidence of high grade AV block or pauses.   Echocardiogram 06/08/2020:  Left ventricle cavity is normal in size. Moderate concentric hypertrophy  of the left ventricle. Normal global wall motion. Normal LV systolic  function with EF 64%. Doppler evidence of grade II (pseudonormal)  diastolic dysfunction, elevated LAP.  Well seated bioprosthetic aortic valve (60m Sapien) with trivial  perivalvular regurgitation. Mean pG 11 mmHg which is unchanged from prior  and likely normal post TAVR.  No evidence of pulmonary hypertension.  No significant change compared to previous study on 12/08/2019.   EKG   06/15/2020: Sinus rhythm at a rate of 81 bpm.  Left axis.  Right bundle branch block. Bifascicular block.   12/16/2019: Sinus bradycardia at a rate of 58 bpm.  Left axis, left anterior fascicular block.  Right bundle branch block.  Bifascicular block.  Compared to EKG 11/13/2019, previous nonspecific T wave abnormality of inferior leads not prominent.  11/13/2019 atrial fibrillation with controlled ventricular response at the rate of 76 bpm, left axis deviation, left anterior fascicular block.  Right bundle branch block.  Nonspecific T wave flattening inferior leads.  Normal eQT interval.   Assessment   No diagnosis found.  No orders of the defined types  were placed in this encounter.   There are no discontinued medications.    This patients CHA2DS2-VASc Score 5 (CHF, HTN, MI, Age) and yearly risk of stroke 7.2%.   NYHA Class II   Recommendations:   DERNST CUMPSTON is a  87y.o. Caucasian male  with vitamin B12 deficiency, mild dementia, hyperlipidemia, hypertension, CAD, chronic stable angina status post CABG x 3 and Maze and LAA amputation in 2014, paroxysmal atrial fibrillation, obstructive sleep apnea not currently on CPAP, moderate aortic stenosis with thoracic aortic aneurysm and dilation of ascending aorta and follows Dr. CLilly Cove(No correlation with TTE with regards to aneurysm), venous stasis edema. Patient underwent TAVR 11/10/2019, at which time he presented in atypical atrial flutter but negative chronotropic were avoided due to age, aortic valve disease and bradycardia. He spontaneously converted to sinus, but presented to our office in atrial fibrillation with RVR and acute decompensated heart failure on 11/13/2019.  At this time he was started on increased dose of diuretics as well as amiodarone, since then patient's had no known recurrence of atrial fibrillation.  Patient presents for 8-week follow-up of fatigue and deconditioning.  Discussed at last office visit they cardiovascular work-up has been relatively unyielding for underlying etiology of patient's symptoms.  Discussed option of ambulatory cardiac monitor, however held off at last visit.***  ***  ***  Patient presents for 2-week follow-up of fatigue and deconditioning.  At last visit obtained BNP and BMP to further evaluate if heart failure was contributing to fatigue and deconditioning.  BNP was normal and BMP stable compared to previous.  Reviewed and discussed with patient and his wife regarding cardiac testing including echocardiogram, recent lab work, cardiac monitor, and cardiac catheterization from last year.  Cardiovascular work-up has been relatively unyielding as  far as underlying etiology of patient's deconditioning.  However given patient's history of atrial fibrillation/flutter cannot exclude recurrent episodes as contributing to his fatigue.  Discussed option of repeating ambulatory cardiac telemetry with patient.  As he is currently scheduled to begin water therapy and has  a upcoming trip to the beach he prefers to hold off on monitor placement at this time.  Given that his symptoms are improving we will follow-up after his trip to the beach and reevaluate need for ambulatory cardiac monitor.  Also recommend continued close follow-up with PCP for management of anemia as this is likely also contributing to patient's current presentation.  ***Patient will need SBE prophylaxis, in view of patient's allergy to amoxicillin, recommend azithromycin.  Follow-up in 8 weeks, sooner if needed, for fatigue.    Alethia Berthold, PA-C 08/16/2020, 10:24 AM Office: [phone]

## 2020-08-17 ENCOUNTER — Inpatient Hospital Stay (HOSPITAL_COMMUNITY)
Admission: EM | Admit: 2020-08-17 | Discharge: 2020-08-23 | DRG: 542 | Disposition: A | Discharge status: Skilled Nursing Facility | Payer: Medicare Other | Attending: Internal Medicine | Admitting: Internal Medicine

## 2020-08-17 ENCOUNTER — Observation Stay (HOSPITAL_COMMUNITY): Payer: Medicare Other

## 2020-08-17 ENCOUNTER — Emergency Department (HOSPITAL_COMMUNITY): Payer: Medicare Other

## 2020-08-17 ENCOUNTER — Encounter (HOSPITAL_COMMUNITY): Payer: Self-pay | Admitting: Emergency Medicine

## 2020-08-17 ENCOUNTER — Other Ambulatory Visit: Payer: Self-pay

## 2020-08-17 ENCOUNTER — Ambulatory Visit: Payer: Medicare Other | Admitting: Student

## 2020-08-17 DIAGNOSIS — I712 Thoracic aortic aneurysm, without rupture: Secondary | ICD-10-CM | POA: Diagnosis present

## 2020-08-17 DIAGNOSIS — Z823 Family history of stroke: Secondary | ICD-10-CM

## 2020-08-17 DIAGNOSIS — S22060B Wedge compression fracture of T7-T8 vertebra, initial encounter for open fracture: Secondary | ICD-10-CM | POA: Diagnosis not present

## 2020-08-17 DIAGNOSIS — Z79899 Other long term (current) drug therapy: Secondary | ICD-10-CM

## 2020-08-17 DIAGNOSIS — J9601 Acute respiratory failure with hypoxia: Secondary | ICD-10-CM | POA: Diagnosis not present

## 2020-08-17 DIAGNOSIS — R778 Other specified abnormalities of plasma proteins: Secondary | ICD-10-CM | POA: Diagnosis not present

## 2020-08-17 DIAGNOSIS — E669 Obesity, unspecified: Secondary | ICD-10-CM | POA: Diagnosis present

## 2020-08-17 DIAGNOSIS — T1490XA Injury, unspecified, initial encounter: Secondary | ICD-10-CM

## 2020-08-17 DIAGNOSIS — Z20822 Contact with and (suspected) exposure to covid-19: Secondary | ICD-10-CM | POA: Diagnosis not present

## 2020-08-17 DIAGNOSIS — S0990XA Unspecified injury of head, initial encounter: Secondary | ICD-10-CM | POA: Diagnosis not present

## 2020-08-17 DIAGNOSIS — Z7952 Long term (current) use of systemic steroids: Secondary | ICD-10-CM

## 2020-08-17 DIAGNOSIS — R0902 Hypoxemia: Secondary | ICD-10-CM | POA: Diagnosis not present

## 2020-08-17 DIAGNOSIS — N400 Enlarged prostate without lower urinary tract symptoms: Secondary | ICD-10-CM | POA: Diagnosis not present

## 2020-08-17 DIAGNOSIS — M5124 Other intervertebral disc displacement, thoracic region: Secondary | ICD-10-CM | POA: Diagnosis not present

## 2020-08-17 DIAGNOSIS — I10 Essential (primary) hypertension: Secondary | ICD-10-CM | POA: Diagnosis not present

## 2020-08-17 DIAGNOSIS — G894 Chronic pain syndrome: Secondary | ICD-10-CM | POA: Diagnosis present

## 2020-08-17 DIAGNOSIS — Z885 Allergy status to narcotic agent status: Secondary | ICD-10-CM

## 2020-08-17 DIAGNOSIS — R55 Syncope and collapse: Secondary | ICD-10-CM | POA: Diagnosis not present

## 2020-08-17 DIAGNOSIS — E663 Overweight: Secondary | ICD-10-CM | POA: Diagnosis not present

## 2020-08-17 DIAGNOSIS — I480 Paroxysmal atrial fibrillation: Secondary | ICD-10-CM

## 2020-08-17 DIAGNOSIS — E785 Hyperlipidemia, unspecified: Secondary | ICD-10-CM | POA: Diagnosis present

## 2020-08-17 DIAGNOSIS — R531 Weakness: Secondary | ICD-10-CM | POA: Diagnosis not present

## 2020-08-17 DIAGNOSIS — F0390 Unspecified dementia without behavioral disturbance: Secondary | ICD-10-CM | POA: Diagnosis present

## 2020-08-17 DIAGNOSIS — D631 Anemia in chronic kidney disease: Secondary | ICD-10-CM | POA: Diagnosis not present

## 2020-08-17 DIAGNOSIS — J811 Chronic pulmonary edema: Secondary | ICD-10-CM | POA: Diagnosis not present

## 2020-08-17 DIAGNOSIS — N183 Chronic kidney disease, stage 3 unspecified: Secondary | ICD-10-CM | POA: Diagnosis not present

## 2020-08-17 DIAGNOSIS — D696 Thrombocytopenia, unspecified: Secondary | ICD-10-CM | POA: Diagnosis not present

## 2020-08-17 DIAGNOSIS — I714 Abdominal aortic aneurysm, without rupture: Secondary | ICD-10-CM | POA: Diagnosis not present

## 2020-08-17 DIAGNOSIS — I130 Hypertensive heart and chronic kidney disease with heart failure and stage 1 through stage 4 chronic kidney disease, or unspecified chronic kidney disease: Secondary | ICD-10-CM | POA: Diagnosis not present

## 2020-08-17 DIAGNOSIS — Z66 Do not resuscitate: Secondary | ICD-10-CM | POA: Diagnosis present

## 2020-08-17 DIAGNOSIS — G4733 Obstructive sleep apnea (adult) (pediatric): Secondary | ICD-10-CM | POA: Diagnosis present

## 2020-08-17 DIAGNOSIS — M8008XA Age-related osteoporosis with current pathological fracture, vertebra(e), initial encounter for fracture: Secondary | ICD-10-CM | POA: Diagnosis not present

## 2020-08-17 DIAGNOSIS — I700 Atherosclerosis of aorta: Secondary | ICD-10-CM | POA: Diagnosis present

## 2020-08-17 DIAGNOSIS — N401 Enlarged prostate with lower urinary tract symptoms: Secondary | ICD-10-CM | POA: Diagnosis present

## 2020-08-17 DIAGNOSIS — I5043 Acute on chronic combined systolic (congestive) and diastolic (congestive) heart failure: Secondary | ICD-10-CM | POA: Diagnosis present

## 2020-08-17 DIAGNOSIS — M109 Gout, unspecified: Secondary | ICD-10-CM | POA: Diagnosis present

## 2020-08-17 DIAGNOSIS — I5032 Chronic diastolic (congestive) heart failure: Secondary | ICD-10-CM

## 2020-08-17 DIAGNOSIS — G309 Alzheimer's disease, unspecified: Secondary | ICD-10-CM | POA: Diagnosis not present

## 2020-08-17 DIAGNOSIS — Z880 Allergy status to penicillin: Secondary | ICD-10-CM

## 2020-08-17 DIAGNOSIS — J189 Pneumonia, unspecified organism: Secondary | ICD-10-CM | POA: Diagnosis not present

## 2020-08-17 DIAGNOSIS — Z91030 Bee allergy status: Secondary | ICD-10-CM

## 2020-08-17 DIAGNOSIS — S22060D Wedge compression fracture of T7-T8 vertebra, subsequent encounter for fracture with routine healing: Secondary | ICD-10-CM | POA: Diagnosis not present

## 2020-08-17 DIAGNOSIS — R7989 Other specified abnormal findings of blood chemistry: Secondary | ICD-10-CM | POA: Diagnosis present

## 2020-08-17 DIAGNOSIS — M1990 Unspecified osteoarthritis, unspecified site: Secondary | ICD-10-CM | POA: Diagnosis present

## 2020-08-17 DIAGNOSIS — M4804 Spinal stenosis, thoracic region: Secondary | ICD-10-CM | POA: Diagnosis not present

## 2020-08-17 DIAGNOSIS — I5033 Acute on chronic diastolic (congestive) heart failure: Secondary | ICD-10-CM | POA: Diagnosis present

## 2020-08-17 DIAGNOSIS — W19XXXA Unspecified fall, initial encounter: Secondary | ICD-10-CM | POA: Diagnosis not present

## 2020-08-17 DIAGNOSIS — Z6830 Body mass index (BMI) 30.0-30.9, adult: Secondary | ICD-10-CM

## 2020-08-17 DIAGNOSIS — Z952 Presence of prosthetic heart valve: Secondary | ICD-10-CM

## 2020-08-17 DIAGNOSIS — K219 Gastro-esophageal reflux disease without esophagitis: Secondary | ICD-10-CM | POA: Diagnosis present

## 2020-08-17 DIAGNOSIS — M353 Polymyalgia rheumatica: Secondary | ICD-10-CM | POA: Diagnosis not present

## 2020-08-17 DIAGNOSIS — N1831 Chronic kidney disease, stage 3a: Secondary | ICD-10-CM | POA: Diagnosis not present

## 2020-08-17 DIAGNOSIS — I2510 Atherosclerotic heart disease of native coronary artery without angina pectoris: Secondary | ICD-10-CM | POA: Diagnosis not present

## 2020-08-17 DIAGNOSIS — E876 Hypokalemia: Secondary | ICD-10-CM | POA: Diagnosis not present

## 2020-08-17 DIAGNOSIS — Z7401 Bed confinement status: Secondary | ICD-10-CM | POA: Diagnosis not present

## 2020-08-17 DIAGNOSIS — R079 Chest pain, unspecified: Secondary | ICD-10-CM

## 2020-08-17 DIAGNOSIS — N138 Other obstructive and reflux uropathy: Secondary | ICD-10-CM | POA: Diagnosis not present

## 2020-08-17 DIAGNOSIS — M545 Low back pain, unspecified: Secondary | ICD-10-CM | POA: Diagnosis not present

## 2020-08-17 DIAGNOSIS — M549 Dorsalgia, unspecified: Secondary | ICD-10-CM | POA: Diagnosis not present

## 2020-08-17 DIAGNOSIS — Z87891 Personal history of nicotine dependence: Secondary | ICD-10-CM

## 2020-08-17 DIAGNOSIS — S22069A Unspecified fracture of T7-T8 vertebra, initial encounter for closed fracture: Secondary | ICD-10-CM | POA: Diagnosis present

## 2020-08-17 DIAGNOSIS — J90 Pleural effusion, not elsewhere classified: Secondary | ICD-10-CM | POA: Diagnosis not present

## 2020-08-17 DIAGNOSIS — Z7901 Long term (current) use of anticoagulants: Secondary | ICD-10-CM

## 2020-08-17 DIAGNOSIS — Z951 Presence of aortocoronary bypass graft: Secondary | ICD-10-CM

## 2020-08-17 DIAGNOSIS — I252 Old myocardial infarction: Secondary | ICD-10-CM

## 2020-08-17 DIAGNOSIS — R0602 Shortness of breath: Secondary | ICD-10-CM | POA: Diagnosis not present

## 2020-08-17 DIAGNOSIS — R0781 Pleurodynia: Secondary | ICD-10-CM | POA: Diagnosis not present

## 2020-08-17 DIAGNOSIS — I7121 Aneurysm of the ascending aorta, without rupture: Secondary | ICD-10-CM | POA: Diagnosis present

## 2020-08-17 DIAGNOSIS — R5383 Other fatigue: Secondary | ICD-10-CM

## 2020-08-17 LAB — BASIC METABOLIC PANEL
Anion gap: 7 (ref 5–15)
BUN: 20 mg/dL (ref 8–23)
CO2: 27 mmol/L (ref 22–32)
Calcium: 8.7 mg/dL — ABNORMAL LOW (ref 8.9–10.3)
Chloride: 106 mmol/L (ref 98–111)
Creatinine, Ser: 1.4 mg/dL — ABNORMAL HIGH (ref 0.61–1.24)
GFR, Estimated: 47 mL/min — ABNORMAL LOW (ref >60)
Glucose, Bld: 110 mg/dL — ABNORMAL HIGH (ref 70–99)
Potassium: 3.6 mmol/L (ref 3.5–5.1)
Sodium: 140 mmol/L (ref 135–145)

## 2020-08-17 LAB — CBC WITH DIFFERENTIAL/PLATELET
Abs Immature Granulocytes: 0.02 10*3/uL (ref 0.00–0.07)
Abs Immature Granulocytes: 0.02 10*3/uL (ref 0.00–0.07)
Abs Immature Granulocytes: 0.03 10*3/uL (ref 0.00–0.07)
Basophils Absolute: 0 10*3/uL (ref 0.0–0.1)
Basophils Absolute: 0 10*3/uL (ref 0.0–0.1)
Basophils Absolute: 0 10*3/uL (ref 0.0–0.1)
Basophils Relative: 1 %
Basophils Relative: 1 %
Basophils Relative: 1 %
Eosinophils Absolute: 0 10*3/uL (ref 0.0–0.5)
Eosinophils Absolute: 0.1 10*3/uL (ref 0.0–0.5)
Eosinophils Absolute: 0 10*3/uL (ref 0.0–0.5)
Eosinophils Relative: 1 %
Eosinophils Relative: 1 %
Eosinophils Relative: 1 %
HCT: 28.4 % — ABNORMAL LOW (ref 39.0–52.0)
HCT: 30.2 % — ABNORMAL LOW (ref 39.0–52.0)
HCT: 28.1 % — ABNORMAL LOW (ref 39.0–52.0)
Hemoglobin: 9 g/dL — ABNORMAL LOW (ref 13.0–17.0)
Hemoglobin: 9.4 g/dL — ABNORMAL LOW (ref 13.0–17.0)
Hemoglobin: 8.6 g/dL — ABNORMAL LOW (ref 13.0–17.0)
Immature Granulocytes: 0 %
Immature Granulocytes: 0 %
Immature Granulocytes: 1 %
Lymphocytes Relative: 20 %
Lymphocytes Relative: 15 %
Lymphocytes Relative: 11 %
Lymphs Abs: 1.1 10*3/uL (ref 0.7–4.0)
Lymphs Abs: 0.8 10*3/uL (ref 0.7–4.0)
Lymphs Abs: 0.7 10*3/uL (ref 0.7–4.0)
MCH: 31.1 pg (ref 26.0–34.0)
MCH: 30.8 pg (ref 26.0–34.0)
MCH: 30.8 pg (ref 26.0–34.0)
MCHC: 31.7 g/dL (ref 30.0–36.0)
MCHC: 31.1 g/dL (ref 30.0–36.0)
MCHC: 30.6 g/dL (ref 30.0–36.0)
MCV: 98.3 fL (ref 80.0–100.0)
MCV: 99 fL (ref 80.0–100.0)
MCV: 100.7 fL — ABNORMAL HIGH (ref 80.0–100.0)
Monocytes Absolute: 0.6 10*3/uL (ref 0.1–1.0)
Monocytes Absolute: 0.6 10*3/uL (ref 0.1–1.0)
Monocytes Absolute: 0.6 10*3/uL (ref 0.1–1.0)
Monocytes Relative: 11 %
Monocytes Relative: 11 %
Monocytes Relative: 10 %
Neutro Abs: 3.7 10*3/uL (ref 1.7–7.7)
Neutro Abs: 3.7 10*3/uL (ref 1.7–7.7)
Neutro Abs: 4.8 10*3/uL (ref 1.7–7.7)
Neutrophils Relative %: 67 %
Neutrophils Relative %: 72 %
Neutrophils Relative %: 76 %
Platelets: 85 10*3/uL — ABNORMAL LOW (ref 150–400)
Platelets: 89 10*3/uL — ABNORMAL LOW (ref 150–400)
Platelets: 83 10*3/uL — ABNORMAL LOW (ref 150–400)
RBC: 2.89 MIL/uL — ABNORMAL LOW (ref 4.22–5.81)
RBC: 3.05 MIL/uL — ABNORMAL LOW (ref 4.22–5.81)
RBC: 2.79 MIL/uL — ABNORMAL LOW (ref 4.22–5.81)
RDW: 15.9 % — ABNORMAL HIGH (ref 11.5–15.5)
RDW: 15.9 % — ABNORMAL HIGH (ref 11.5–15.5)
RDW: 15.9 % — ABNORMAL HIGH (ref 11.5–15.5)
WBC: 5.5 10*3/uL (ref 4.0–10.5)
WBC: 5.2 10*3/uL (ref 4.0–10.5)
WBC: 6.2 10*3/uL (ref 4.0–10.5)
nRBC: 0 % (ref 0.0–0.2)
nRBC: 0 % (ref 0.0–0.2)
nRBC: 0 % (ref 0.0–0.2)

## 2020-08-17 LAB — URINALYSIS, ROUTINE W REFLEX MICROSCOPIC
Bilirubin Urine: NEGATIVE
Glucose, UA: NEGATIVE mg/dL
Hgb urine dipstick: NEGATIVE
Ketones, ur: NEGATIVE mg/dL
Leukocytes,Ua: NEGATIVE
Nitrite: NEGATIVE
Protein, ur: NEGATIVE mg/dL
Specific Gravity, Urine: 1.012 (ref 1.005–1.030)
pH: 7 (ref 5.0–8.0)

## 2020-08-17 LAB — RESP PANEL BY RT-PCR (FLU A&B, COVID) ARPGX2
Influenza A by PCR: NEGATIVE
Influenza B by PCR: NEGATIVE
SARS Coronavirus 2 by RT PCR: NEGATIVE

## 2020-08-17 LAB — CK: Total CK: 38 U/L — ABNORMAL LOW (ref 49–397)

## 2020-08-17 LAB — BRAIN NATRIURETIC PEPTIDE: B Natriuretic Peptide: 247.9 pg/mL — ABNORMAL HIGH (ref 0.0–100.0)

## 2020-08-17 LAB — PSA: Prostatic Specific Antigen: 4.91 ng/mL — ABNORMAL HIGH (ref 0.00–4.00)

## 2020-08-17 LAB — MAGNESIUM: Magnesium: 2.6 mg/dL — ABNORMAL HIGH (ref 1.7–2.4)

## 2020-08-17 LAB — TROPONIN I (HIGH SENSITIVITY)
Troponin I (High Sensitivity): 53 ng/L — ABNORMAL HIGH (ref <18)
Troponin I (High Sensitivity): 52 ng/L — ABNORMAL HIGH (ref <18)

## 2020-08-17 LAB — PROCALCITONIN: Procalcitonin: <0.1 ng/mL

## 2020-08-17 MED ORDER — ACETAMINOPHEN 650 MG RE SUPP: 650.0000 mg | Freq: Four times a day (QID) | RECTAL | Status: DC | PRN

## 2020-08-17 MED ORDER — GABAPENTIN 100 MG PO CAPS
100.0000 mg | ORAL_CAPSULE | Freq: Three times a day (TID) | ORAL | Status: DC
  Administered 2020-08-17 – 2020-08-18 (×2): 100 mg via ORAL
  Filled 2020-08-17 (×2): qty 1

## 2020-08-17 MED ORDER — SODIUM CHLORIDE 0.9% FLUSH
3.0000 mL | Freq: Two times a day (BID) | INTRAVENOUS | Status: DC
  Administered 2020-08-17 – 2020-08-23 (×11): 3 mL via INTRAVENOUS

## 2020-08-17 MED ORDER — ISOSORBIDE MONONITRATE ER 60 MG PO TB24
60.0000 mg | ORAL_TABLET | Freq: Every day | ORAL | Status: DC
  Administered 2020-08-17 – 2020-08-22 (×6): 60 mg via ORAL
  Filled 2020-08-17 (×2): qty 2
  Filled 2020-08-17 (×5): qty 1

## 2020-08-17 MED ORDER — LEFLUNOMIDE 20 MG PO TABS
10.0000 mg | ORAL_TABLET | Freq: Every day | ORAL | Status: DC
  Administered 2020-08-18 – 2020-08-23 (×6): 10 mg via ORAL
  Filled 2020-08-17 (×6): qty 0.5

## 2020-08-17 MED ORDER — PREDNISONE 5 MG PO TABS
9.0000 mg | ORAL_TABLET | Freq: Every day | ORAL | Status: DC
  Administered 2020-08-18 – 2020-08-23 (×6): 9 mg via ORAL
  Filled 2020-08-17 (×6): qty 4

## 2020-08-17 MED ORDER — FUROSEMIDE 10 MG/ML IJ SOLN
40.0000 mg | Freq: Once | INTRAMUSCULAR | Status: AC
  Administered 2020-08-17: 40 mg via INTRAVENOUS
  Filled 2020-08-17: qty 4

## 2020-08-17 MED ORDER — HYDROCODONE-ACETAMINOPHEN 5-325 MG PO TABS
1.0000 | ORAL_TABLET | ORAL | Status: DC | PRN
  Administered 2020-08-17 (×2): 2 via ORAL
  Filled 2020-08-17 (×2): qty 2

## 2020-08-17 MED ORDER — RIVAROXABAN 15 MG PO TABS
15.0000 mg | ORAL_TABLET | Freq: Every day | ORAL | Status: DC
  Administered 2020-08-17 – 2020-08-23 (×7): 15 mg via ORAL
  Filled 2020-08-17 (×8): qty 1

## 2020-08-17 MED ORDER — VITAMIN B-12 1000 MCG PO TABS
1000.0000 ug | ORAL_TABLET | Freq: Every day | ORAL | Status: DC
  Administered 2020-08-18 – 2020-08-23 (×6): 1000 ug via ORAL
  Filled 2020-08-17 (×6): qty 1

## 2020-08-17 MED ORDER — VITAMIN D 25 MCG (1000 UNIT) PO TABS
1000.0000 [IU] | ORAL_TABLET | Freq: Every day | ORAL | Status: DC
  Administered 2020-08-18 – 2020-08-23 (×6): 1000 [IU] via ORAL
  Filled 2020-08-17 (×6): qty 1

## 2020-08-17 MED ORDER — AZITHROMYCIN 250 MG PO TABS
250.0000 mg | ORAL_TABLET | Freq: Every day | ORAL | Status: DC
  Administered 2020-08-18 – 2020-08-19 (×2): 250 mg via ORAL
  Filled 2020-08-17 (×2): qty 1

## 2020-08-17 MED ORDER — SODIUM CHLORIDE 0.9% FLUSH: 3.0000 mL | INTRAVENOUS | Status: DC | PRN

## 2020-08-17 MED ORDER — DEXTROSE 5 % IV SOLN
1.0000 g | Freq: Two times a day (BID) | INTRAVENOUS | Status: DC
  Administered 2020-08-17 – 2020-08-18 (×3): 1 g via INTRAVENOUS
  Filled 2020-08-17 (×4): qty 1

## 2020-08-17 MED ORDER — GALANTAMINE HYDROBROMIDE ER 8 MG PO CP24
8.0000 mg | ORAL_CAPSULE | Freq: Every day | ORAL | Status: DC
  Administered 2020-08-18 – 2020-08-23 (×6): 8 mg via ORAL
  Filled 2020-08-17 (×6): qty 1

## 2020-08-17 MED ORDER — AMLODIPINE BESYLATE 10 MG PO TABS
10.0000 mg | ORAL_TABLET | Freq: Every day | ORAL | Status: DC
  Administered 2020-08-17 – 2020-08-23 (×7): 10 mg via ORAL
  Filled 2020-08-17 (×4): qty 1
  Filled 2020-08-17: qty 2
  Filled 2020-08-17: qty 1
  Filled 2020-08-17: qty 2

## 2020-08-17 MED ORDER — AMIODARONE HCL 200 MG PO TABS
200.0000 mg | ORAL_TABLET | Freq: Every day | ORAL | Status: DC
  Administered 2020-08-17 – 2020-08-23 (×7): 200 mg via ORAL
  Filled 2020-08-17 (×7): qty 1

## 2020-08-17 MED ORDER — NITROGLYCERIN 0.4 MG SL SUBL: 0.4000 mg | SUBLINGUAL_TABLET | SUBLINGUAL | Status: DC | PRN

## 2020-08-17 MED ORDER — PSYLLIUM 95 % PO PACK
1.0000 | PACK | Freq: Every day | ORAL | Status: DC
  Administered 2020-08-18 – 2020-08-23 (×6): 1 via ORAL
  Filled 2020-08-17 (×6): qty 1

## 2020-08-17 MED ORDER — PANTOPRAZOLE SODIUM 40 MG PO TBEC
40.0000 mg | DELAYED_RELEASE_TABLET | ORAL | Status: DC
  Administered 2020-08-18: 40 mg via ORAL
  Filled 2020-08-17: qty 1

## 2020-08-17 MED ORDER — MEMANTINE HCL 10 MG PO TABS
10.0000 mg | ORAL_TABLET | Freq: Two times a day (BID) | ORAL | Status: DC
  Administered 2020-08-17 – 2020-08-23 (×12): 10 mg via ORAL
  Filled 2020-08-17 (×15): qty 1

## 2020-08-17 MED ORDER — FUROSEMIDE 20 MG PO TABS
40.0000 mg | ORAL_TABLET | Freq: Every day | ORAL | Status: DC
  Administered 2020-08-17: 40 mg via ORAL
  Filled 2020-08-17: qty 2

## 2020-08-17 MED ORDER — CYCLOBENZAPRINE HCL 10 MG PO TABS
10.0000 mg | ORAL_TABLET | Freq: Three times a day (TID) | ORAL | Status: DC | PRN
  Administered 2020-08-20: 10 mg via ORAL
  Filled 2020-08-17: qty 1

## 2020-08-17 MED ORDER — EZETIMIBE 10 MG PO TABS
10.0000 mg | ORAL_TABLET | Freq: Every day | ORAL | Status: DC
  Administered 2020-08-17 – 2020-08-23 (×6): 10 mg via ORAL
  Filled 2020-08-17 (×6): qty 1

## 2020-08-17 MED ORDER — POTASSIUM CHLORIDE CRYS ER 10 MEQ PO TBCR: 10.0000 meq | EXTENDED_RELEASE_TABLET | Freq: Every day | ORAL | Status: DC

## 2020-08-17 MED ORDER — AZITHROMYCIN 250 MG PO TABS
500.0000 mg | ORAL_TABLET | Freq: Every day | ORAL | Status: AC
  Administered 2020-08-17: 500 mg via ORAL
  Filled 2020-08-17: qty 2

## 2020-08-17 MED ORDER — OMEGA-3 FATTY ACIDS 1000 MG PO CAPS: 1.0000 g | ORAL_CAPSULE | Freq: Every day | ORAL | Status: DC

## 2020-08-17 MED ORDER — ACETAMINOPHEN 325 MG PO TABS: 650.0000 mg | ORAL_TABLET | Freq: Four times a day (QID) | ORAL | Status: DC | PRN

## 2020-08-17 MED ORDER — HYDRALAZINE HCL 25 MG PO TABS
25.0000 mg | ORAL_TABLET | Freq: Two times a day (BID) | ORAL | Status: DC
  Administered 2020-08-18 – 2020-08-23 (×10): 25 mg via ORAL
  Filled 2020-08-17 (×11): qty 1

## 2020-08-17 MED ORDER — OMEGA-3-ACID ETHYL ESTERS 1 G PO CAPS
1.0000 g | ORAL_CAPSULE | Freq: Every day | ORAL | Status: DC
  Administered 2020-08-18 – 2020-08-23 (×6): 1 g via ORAL
  Filled 2020-08-17 (×8): qty 1

## 2020-08-17 MED ORDER — SODIUM CHLORIDE 0.9 % IV SOLN: 250.0000 mL | INTRAVENOUS | Status: DC | PRN

## 2020-08-17 MED ORDER — PSYLLIUM HUSK POWD: 1.0000 | Freq: Every day | Status: DC

## 2020-08-17 MED ORDER — METRONIDAZOLE 500 MG PO TABS
500.0000 mg | ORAL_TABLET | Freq: Three times a day (TID) | ORAL | Status: DC
  Administered 2020-08-17 (×2): 500 mg via ORAL
  Filled 2020-08-17 (×2): qty 1

## 2020-08-17 NOTE — ED Notes (Signed)
Pt given meal tray, but doesn't want to eat.

## 2020-08-17 NOTE — ED Notes (Addendum)
I bladder scanned pt and there was 0 ml in there. Dr. Rogers Blocker notified. I introduced myself to pt and spouse. Pt's pain 7/10, mostly in lower abdomen. Has some pain in back, but not as intense. I told him I will medicate him. Oriented x4. GCS 15. Wearing back brace and laying flat. Denies further needs.

## 2020-08-17 NOTE — ED Notes (Signed)
Pt resting in bed. Pain 6/10. Didn't eat dinner tray. Says when he can have more pain medication he would probably take it.

## 2020-08-17 NOTE — ED Provider Notes (Signed)
[facility] EMERGENCY DEPARTMENT Provider Note   CSN: [id number] Arrival date & time: 08/17/20  7517     History Chief Complaint  Patient presents with   [name] [name] is a 87 y.o. male with a history of AAA, thoracic aortic aneurysm, TAVR, MI, hypertension, on Xarelto, presenting to emergency department with back pain.  The patient reports that he lost his balance yesterday and fell backwards onto the ground.  He spent approximately an hour in the ground before he was able to call EMS to come pick him up.  He reports today he secured a rope strap around his back and hoisted him off the ground.  He did not have significant pain at that time.  He went to bed last night, when he woke up this morning was still pain-free.  However when he was sitting up to get out of bed, he began to have pain in his lower mid thoracic back.  He describes it as a soreness.  It is worse with movement, and better (goes away completely) when lying still at rest.  It is worse with standing up and deep inspiration.  He reports he has a history of a L1 lumbar fracture which she says "usually gives me a lot of grief" but feels this is different pain.  EMS reported initially concern for hypoxia with an O2 sat in the 80s, placing the patient on 6 L nasal cannula.  Here in the emergency department the patient is 94% on room air and in no respiratory distress.  He denies shortness of breath.  He denies fevers or chills.  He reports compliance with his Xarelto.  He denies any chest pressure.  He states that he lives at home with his wife, and currently is also having his son and grandson visiting.  HPI     Past Medical History:  Diagnosis Date   AAA (abdominal aortic aneurysm) ([place])    Acute coronary syndrome ([place]) 0/01/7492   Acute systolic heart failure (HCC) 03/03/2012   Angina    Arthritis    Ascending aorta dilatation ([place]) 03/13/2012   Fusiform dilatation of the ascending thoracic aorta  discovered during surgery, max diameter 4.2-4.3 cm   Atrial fibrillation (HCC) 02/29/2012   Recurrent paroxysmal, new-onset   BPH (benign prostatic hypertrophy)    CAD (coronary artery disease)    Cath April 2012 40% left main, 50% LAD, 40% OM, occluded RCA with L-R collaterals    CHF (congestive heart failure) (HCC)    Chronic venous insufficiency    Dyspnea    GERD (gastroesophageal reflux disease)    Glaucoma    Gout    Headache    MIGRAINES in the past   History of kidney stones    Hyperlipidemia    Hypertension    MI (myocardial infarction) ([place])    Obesity (BMI 30-39.9)    Paroxysmal atrial fibrillation (HCC) 02/29/2012   Recurrent paroxysmal, new-onset    PMR (polymyalgia rheumatica) (HCC)    Pre-diabetes    S/P CABG x 3 03/06/2012   LIMA to LAD, SVG to OM, SVG to RCA, EVH via right thigh   S/P Maze operation for atrial fibrillation 03/06/2012   Complete bilateral lesions set using bipolar radiofrequency and cryothermy ablation with clipping of LA appendage   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   s/p TAVR with a 26 mm Edwards via the left subclavian by Drs Buena Irish and Bartle   Severe aortic stenosis  09/29/2019   Sleep apnea    USES CPAP NIGHTLY    Patient Active Problem List   Diagnosis Date Noted   Pain due to onychomycosis of toenails of both feet 07/27/2020   Callus of heel 07/27/2020   Bifascicular block 11/11/2019   Acute on chronic diastolic heart failure ([place]) 11/10/2019   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   Severe aortic stenosis 09/29/2019   OSA (obstructive sleep apnea) 11/18/2014   Thoracic ascending aortic aneurysm ([place]) 09/29/2012   S/P laparoscopic cholecystectomy 04/09/2012   S/P CABG x 3 03/06/2012   S/P Maze operation for atrial fibrillation 03/06/2012   Paroxysmal atrial fibrillation ([place]) 02/29/2012   CAD (coronary artery disease), native coronary artery    Hyperlipidemia    Obesity (BMI 30-39.9)     Hypertension    Chronic venous insufficiency    Gout    Benign prostatic hyperplasia     Past Surgical History:  Procedure Laterality Date   CARDIAC CATHETERIZATION     CATARACT EXTRACTION, BILATERAL     with lens implants   CHOLECYSTECTOMY N/A 03/24/2012   Procedure: LAPAROSCOPIC CHOLECYSTECTOMY WITH INTRAOPERATIVE CHOLANGIOGRAM;  Surgeon: Zenovia Jarred, MD;  Location: [place];  Service: General;  Laterality: N/A;   CORONARY ARTERY BYPASS GRAFT N/A 03/06/2012   Procedure: CORONARY ARTERY BYPASS GRAFTING (CABG);  Surgeon: Rexene Alberts, MD;  Location: [place];  Service: Open Heart Surgery;  Laterality: N/A;   ENDOVEIN HARVEST OF GREATER SAPHENOUS VEIN Right 03/06/2012   Procedure: ENDOVEIN HARVEST OF GREATER SAPHENOUS VEIN;  Surgeon: Rexene Alberts, MD;  Location: [place];  Service: Open Heart Surgery;  Laterality: Right;   INTRAOPERATIVE TRANSESOPHAGEAL ECHOCARDIOGRAM N/A 03/06/2012   Procedure: INTRAOPERATIVE TRANSESOPHAGEAL ECHOCARDIOGRAM;  Surgeon: Rexene Alberts, MD;  Location: [place];  Service: Open Heart Surgery;  Laterality: N/A;   IR KYPHO LUMBAR INC FX REDUCE BONE BX UNI/BIL CANNULATION INC/IMAGING  02/23/2020   LEFT HEART CATH Right 03/02/2012   Procedure: LEFT HEART CATH;  Surgeon: Sherren Mocha, MD;  Location: [facility];  Service: Cardiovascular;  Laterality: Right;   MAZE N/A 03/06/2012   Procedure: MAZE;  Surgeon: Rexene Alberts, MD;  Location: [place];  Service: Open Heart Surgery;  Laterality: N/A;   PROSTATECTOMY     partial   RIGHT/LEFT HEART CATH AND CORONARY/GRAFT ANGIOGRAPHY N/A 09/29/2019   Procedure: RIGHT/LEFT HEART CATH AND CORONARY/GRAFT ANGIOGRAPHY;  Surgeon: Adrian Prows, MD;  Location: [place] [facility];  Service: Cardiovascular;  Laterality: N/A;   TEE WITHOUT CARDIOVERSION N/A 11/10/2019   Procedure: TRANSESOPHAGEAL ECHOCARDIOGRAM (TEE);  Surgeon: Burnell Blanks, MD;  Location: [place];  Service: Open Heart Surgery;  Laterality: N/A;   TRANSCATHETER AORTIC  VALVE REPLACEMENT, TRANSFEMORAL  11/10/2019       Family History  Problem Relation Age of Onset   Dementia Mother    Stroke Brother     Social History   Tobacco Use   Smoking status: Former    Packs/day: 0.50    Years: 15.00    Pack years: 7.50    Types: Cigarettes    Quit date: 11/18/1962    Years since quitting: 57.7   Smokeless tobacco: Never  Vaping Use   Vaping Use: Never used  Substance Use Topics   Alcohol use: Yes    Alcohol/week: 2.0 standard drinks    Types: 2 Shots of liquor per week    Comment: daily   Drug use: Never    Home Medications Prior to  Admission medications   Medication Sig Start Date End Date Taking? Authorizing Provider  acetaminophen (TYLENOL) 500 MG tablet Take 500 mg by mouth in the morning and at bedtime.    [provider]  amiodarone (PACERONE) 200 MG tablet Take 1 tablet (200 mg total) by mouth daily. 03/09/20   Cantwell, Celeste C, PA-C  amLODipine (NORVASC) 10 MG tablet Take 1 tablet (10 mg total) by mouth daily. 07/28/20   Cantwell, Celeste C, PA-C  azithromycin (ZITHROMAX) 500 MG tablet Take one tablet (500 mg) one hour prior to all dental visits. Patient taking differently: Take by mouth See admin instructions. Take one tablet (500 mg) one hour prior to all dental visits. 11/19/19   Eileen Stanford, PA-C  cholecalciferol (VITAMIN D3) 25 MCG (1000 UNIT) tablet Take 1,000 Units by mouth daily.     [provider]  cyclobenzaprine (FLEXERIL) 10 MG tablet Take 10 mg by mouth 3 (three) times daily as needed for muscle spasms.    [provider]  EPINEPHrine 0.3 mg/0.3 mL IJ SOAJ injection Inject 0.3 mLs (0.3 mg total) into the muscle as needed for anaphylaxis. 08/08/18   Dara Hoyer, FNP  ezetimibe (ZETIA) 10 MG tablet Take 10 mg by mouth daily.    [provider]  fish oil-omega-3 fatty acids 1000 MG capsule Take 1 g by mouth daily.     [provider]  fluticasone (FLONASE) 50 MCG/ACT nasal  spray Place into both nostrils daily.    [provider]  furosemide (LASIX) 40 MG tablet TAKE 1 TABLET(40 MG) BY MOUTH TWICE DAILY AT 10:00AM AND AT 4:00PM Patient taking differently: Take 40 mg by mouth daily. 05/23/20   Cantwell, Celeste C, PA-C  gabapentin (NEURONTIN) 100 MG capsule Take 100 mg by mouth 3 (three) times daily.    [provider]  galantamine (RAZADYNE ER) 8 MG 24 hr capsule Take 8 mg by mouth Daily. 02/26/11   [provider]  HONEY BEE VENOM IJ Inject 2 each as directed See admin instructions. Every eight weeks    [provider]  HYDROcodone-acetaminophen (NORCO/VICODIN) 5-325 MG tablet Take 1 tablet by mouth every 6 (six) hours as needed for severe pain. 12/10/19   [provider]  isosorbide mononitrate (IMDUR) 60 MG 24 hr tablet TAKE 1 TABLET(60 MG) BY MOUTH DAILY AFTER SUPPER 05/23/20   Adrian Prows, MD  leflunomide (ARAVA) 20 MG tablet Take 20 mg by mouth daily.    [provider]  magnesium gluconate (MAGONATE) 500 MG tablet Take 500 mg by mouth 2 (two) times daily.    [provider]  memantine (NAMENDA) 10 MG tablet Take 10 mg by mouth 2 (two) times daily. 09/08/19   [provider]  nitroGLYCERIN (NITROSTAT) 0.4 MG SL tablet Place 1 tablet (0.4 mg total) under the tongue every 5 (five) minutes x 3 doses as needed for chest pain. 12/16/19   Cantwell, Celeste C, PA-C  pantoprazole (PROTONIX) 40 MG tablet Take 40 mg by mouth every other day.  10/28/14   [provider]  potassium chloride (MICRO-K) 10 MEQ CR capsule TAKE 1 CAPSULE BY MOUTH DAILY WITH LASIX Patient taking differently: Take 10 mEq by mouth daily. 02/05/20   Cantwell, Celeste C, PA-C  predniSONE (DELTASONE) 1 MG tablet Take 5 mg by mouth daily with breakfast.    [provider]  PRESCRIPTION MEDICATION Cpap    [provider]  Psyllium Husk POWD Take 1 Scoop by mouth daily.  [provider]  Rivaroxaban  (XARELTO) 15 MG TABS tablet Take 15 mg by mouth daily with supper.    [provider]  triamcinolone cream (KENALOG) 0.1 % Apply 1 application topically as needed (skin irritation).     [provider]  vitamin B-12 (CYANOCOBALAMIN) 1000 MCG tablet Take 1,000 mcg by mouth daily.     [provider]    Allergies    Amoxicillin, Bee venom, and Morphine and related  Review of Systems   Review of Systems  Constitutional:  Negative for chills and fever.  Eyes:  Negative for pain and visual disturbance.  Respiratory:  Negative for cough and shortness of breath.   Cardiovascular:  Negative for chest pain and palpitations.  Gastrointestinal:  Negative for abdominal pain, nausea and vomiting.  Genitourinary:  Negative for dysuria and hematuria.  Musculoskeletal:  Positive for arthralgias and myalgias.  Skin:  Negative for color change and rash.  Neurological:  Negative for syncope and headaches.  All other systems reviewed and are negative.  Physical Exam Updated Vital Signs SpO2 94% Comment: RA  Physical Exam Constitutional:      General: He is not in acute distress. HENT:     Head: Normocephalic and atraumatic.  Eyes:     Conjunctiva/sclera: Conjunctivae normal.     Pupils: Pupils are equal, round, and reactive to light.  Cardiovascular:     Rate and Rhythm: Normal rate. Rhythm irregular.     Pulses: Normal pulses.     Heart sounds: Murmur heard.  Pulmonary:     Effort: Pulmonary effort is normal. No respiratory distress.  Abdominal:     General: There is no distension.     Tenderness: There is no abdominal tenderness.  Musculoskeletal:     Comments: Pain reproducible with sitting up, rolling to the side, better lying flat at rest Lower mid-thoracic and upper L spine tenderness  Skin:    General: Skin is warm and dry.  Neurological:     General: No focal deficit present.     Mental Status: He is alert. Mental status is at baseline.  Psychiatric:         Mood and Affect: Mood normal.        Behavior: Behavior normal.    ED Results / Procedures / Treatments   Labs (all labs ordered are listed, but only abnormal results are displayed) Labs Reviewed - No data to display  EKG None  Radiology No results found.  Procedures Procedures   Medications Ordered in ED Medications - No data to display  ED Course  I have reviewed the triage vital signs and the nursing notes.  Pertinent labs & imaging results that were available during my care of the patient were reviewed by me and considered in my medical decision making (see chart for details).  Ddx includes paraspinal spasm vs traumatic compression fx vs rib fx vs aspiration PNA vs rhabdo vs other  The fact that the pain is immediately reproducible with movement, and goes away completely at rest, lowers my suspicion for AAA/aneurysm.  CT scans ordered as a level 2 given his a fall on blood thinners.  Labs also ordered.  I reviewed his EKG which shows his baseline A. fib, no acute ischemic findings.  Clinical Course as of 08/17/20 1726  Wed Aug 17, 2020  1044 Cr and hgb near recent baseline levels.  Trop 53 initially (prior levels > 100).  Remains CP free. [MT]  7001 I have ordered a TLSO  brace as well as the MRI of the thoracic spine.  The patient remained supine in the bed.  If this is an unstable fracture he may require neurosurgical consultation, although I doubt that he is a candidate for operative intervention with his medical comorbidities.  I have ordered him antibiotics for pneumonia noted on CT, it is unclear if this may be an aspiration after his fall yesterday or community pneumonia; however with his transient hypoxia at home I think is reasonable to cover him empirically with antibiotics.  I have paged for admission. [MT]  1227 Admitted to Dr Rogers Blocker hospitalist [MT]    Clinical Course User Index [MT] Langston Masker Carola Rhine, MD     Final Clinical Impression(s) / ED  Diagnoses Pneumonia Thoracic vertebral fracture  Rx / DC Orders ED Discharge Orders     None        Wyvonnia Dusky, MD 08/17/20 1727

## 2020-08-17 NOTE — H&P (Signed)
History and Physical    [name] [name] LGX:[id number] DOB: [birth year] DOA: 08/17/2020  PCP: [name], MD Consultants:  cardiology: Dr. Nadyne Coombes, Dr. Vivien Presto and Dr. Vernetta Honey for CTS, Dr. Trudie Reed for rheumatology, Dr. Karsten Ro for urology, Dr. Halford Chessman for pulmonology, Dr. Nelva Bush for pain.   Patient coming from:  Home - lives with his wife    Chief Complaint: fall and pain in back.   HPI: [name] [name] is a 87 y.o. male with medical history significant of PAF on eliquis, CAD s/p CABG, severe aortic stenosis s/p TAVR, OSA, HTN, HLD, prediabetes, CKD stage 3, diastolic CHF, PMR on daily prednisone who presented to ER after fall at home on 08/16/2020. He states around 3pm yesterday he lost his balance and fell backwards onto ground. He did not hit his head. He was on the ground for an hour or so and couldn't get up. His son came over and called EMS who helped get him up, but he did not go to hospital. He went to bed and woke up this morning and couldn't move due to the pain across his chest. He was unable to get out of bed, so EMS was called and he was brought to the hospital.  He states his pain is across his entire chest and is 10/10. No radiation and described as stabbing/excruciating. Worse with any kind of movement or deep breathing. He states it is okay at rest. He denies any coughing, shortness of breath, fevers, N/V/D, weight gain, leg swelling. His legs are chronically edematous, but they are actually better today than normal.     ED Course: vitals: afebrile, HR: 86, RR: 28, oxygen: 94% room air. Per EMS, oxygen in the 80s upon EMS arrival, placed on 6L and up to 96%.  Pertinent labs: creatinine: 1.40, hgb: 9.0, platelets of 85, troponin: 53, delta pending, BNP: 247, CXR with pulmonary edema and possible nodular consolidation in left upper lobe.  CT thoracic: nondisplaced nearly horizontal fx through the T8 vertebral body with potential posterior extension into the posterior elements at T9. ? Unstable  fx, mri pending.  CT chest: bilateral pleural effusion with multifocal patchy airspace and groundglass opacities in both lungs, pulmonary edema vs. Pneumonia.  Given claforan and flagyl in ED for possible aspiration pneumonia. Called and asked to admit.   Review of Systems: As per HPI; otherwise review of systems reviewed and negative.   Ambulatory Status:  Ambulates with walker     Past Medical History:  Diagnosis Date   AAA (abdominal aortic aneurysm) ([place])    Acute coronary syndrome ([place]) 08/14/4816   Acute systolic heart failure ([place]) 03/03/2012   Angina    Arthritis    Ascending aorta dilatation ([place]) 03/13/2012   Fusiform dilatation of the ascending thoracic aorta discovered during surgery, max diameter 4.2-4.3 cm   Atrial fibrillation (HCC) 02/29/2012   Recurrent paroxysmal, new-onset   BPH (benign prostatic hypertrophy)    CAD (coronary artery disease)    Cath April 2012 40% left main, 50% LAD, 40% OM, occluded RCA with L-R collaterals    CHF (congestive heart failure) (HCC)    Chronic venous insufficiency    Dyspnea    GERD (gastroesophageal reflux disease)    Glaucoma    Gout    Headache    MIGRAINES in the past   History of kidney stones    Hyperlipidemia    Hypertension    MI (myocardial infarction) ([place])    Obesity (BMI 30-39.9)    Paroxysmal atrial  fibrillation ([place]) 02/29/2012   Recurrent paroxysmal, new-onset    PMR (polymyalgia rheumatica) (HCC)    Pre-diabetes    S/P CABG x 3 03/06/2012   LIMA to LAD, SVG to OM, SVG to RCA, EVH via right thigh   S/P Maze operation for atrial fibrillation 03/06/2012   Complete bilateral lesions set using bipolar radiofrequency and cryothermy ablation with clipping of LA appendage   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   s/p TAVR with a 26 mm Edwards via the left subclavian by Drs Buena Irish and Bartle   Severe aortic stenosis 09/29/2019   Sleep apnea    USES CPAP NIGHTLY    Past Surgical History:   Procedure Laterality Date   CARDIAC CATHETERIZATION     CATARACT EXTRACTION, BILATERAL     with lens implants   CHOLECYSTECTOMY N/A 03/24/2012   Procedure: LAPAROSCOPIC CHOLECYSTECTOMY WITH INTRAOPERATIVE CHOLANGIOGRAM;  Surgeon: Zenovia Jarred, MD;  Location: [place];  Service: General;  Laterality: N/A;   CORONARY ARTERY BYPASS GRAFT N/A 03/06/2012   Procedure: CORONARY ARTERY BYPASS GRAFTING (CABG);  Surgeon: Rexene Alberts, MD;  Location: [place];  Service: Open Heart Surgery;  Laterality: N/A;   ENDOVEIN HARVEST OF GREATER SAPHENOUS VEIN Right 03/06/2012   Procedure: ENDOVEIN HARVEST OF GREATER SAPHENOUS VEIN;  Surgeon: Rexene Alberts, MD;  Location: [facility];  Service: Open Heart Surgery;  Laterality: Right;   INTRAOPERATIVE TRANSESOPHAGEAL ECHOCARDIOGRAM N/A 03/06/2012   Procedure: INTRAOPERATIVE TRANSESOPHAGEAL ECHOCARDIOGRAM;  Surgeon: Rexene Alberts, MD;  Location: [place];  Service: Open Heart Surgery;  Laterality: N/A;   IR KYPHO LUMBAR INC FX REDUCE BONE BX UNI/BIL CANNULATION INC/IMAGING  02/23/2020   LEFT HEART CATH Right 03/02/2012   Procedure: LEFT HEART CATH;  Surgeon: Sherren Mocha, MD;  Location: [facility] CATH LAB;  Service: Cardiovascular;  Laterality: Right;   MAZE N/A 03/06/2012   Procedure: MAZE;  Surgeon: Rexene Alberts, MD;  Location: [place];  Service: Open Heart Surgery;  Laterality: N/A;   PROSTATECTOMY     partial   RIGHT/LEFT HEART CATH AND CORONARY/GRAFT ANGIOGRAPHY N/A 09/29/2019   Procedure: RIGHT/LEFT HEART CATH AND CORONARY/GRAFT ANGIOGRAPHY;  Surgeon: Adrian Prows, MD;  Location: [place] CV LAB;  Service: Cardiovascular;  Laterality: N/A;   TEE WITHOUT CARDIOVERSION N/A 11/10/2019   Procedure: TRANSESOPHAGEAL ECHOCARDIOGRAM (TEE);  Surgeon: Burnell Blanks, MD;  Location: [place];  Service: Open Heart Surgery;  Laterality: N/A;   TRANSCATHETER AORTIC VALVE REPLACEMENT, TRANSFEMORAL  11/10/2019    Social History   Socioeconomic History   Marital status: Married     Spouse name: Not on file   Number of children: 4   Years of education: Not on file   Highest education level: Not on file  Occupational History   Occupation: retired    Comment: [employer]  Tobacco Use   Smoking status: Former    Packs/day: 0.50    Years: 15.00    Pack years: 7.50    Types: Cigarettes    Quit date: 11/18/1962    Years since quitting: 57.7   Smokeless tobacco: Never  Vaping Use   Vaping Use: Never used  Substance and Sexual Activity   Alcohol use: Yes    Alcohol/week: 2.0 standard drinks    Types: 2 Shots of liquor per week    Comment: daily   Drug use: Never   Sexual activity: Yes  Other Topics Concern   Not on file  Social History Narrative   Lives with wife  in [place].     Very active physically without significant limitations.    Exercises regularly   Social Determinants of Health   Financial Resource Strain: Not on file  Food Insecurity: Not on file  Transportation Needs: Not on file  Physical Activity: Not on file  Stress: Not on file  Social Connections: Not on file  Intimate Partner Violence: Not on file    Allergies  Allergen Reactions   Amoxicillin Other (See Comments)    Headache, debilitated   Bee Venom Anaphylaxis   Morphine And Related Other (See Comments)    hypotension    Family History  Problem Relation Age of Onset   Dementia Mother    Stroke Brother     Prior to Admission medications   Medication Sig Start Date End Date Taking? Authorizing Provider  acetaminophen (TYLENOL) 500 MG tablet Take 500 mg by mouth in the morning and at bedtime.    [provider]  amiodarone (PACERONE) 200 MG tablet Take 1 tablet (200 mg total) by mouth daily. 03/09/20   Cantwell, Celeste C, PA-C  amLODipine (NORVASC) 10 MG tablet Take 1 tablet (10 mg total) by mouth daily. 07/28/20   Cantwell, Celeste C, PA-C  azithromycin (ZITHROMAX) 500 MG tablet Take one tablet (500 mg) one hour prior to all dental visits. Patient taking  differently: Take by mouth See admin instructions. Take one tablet (500 mg) one hour prior to all dental visits. 11/19/19   Eileen Stanford, PA-C  cholecalciferol (VITAMIN D3) 25 MCG (1000 UNIT) tablet Take 1,000 Units by mouth daily.     [provider]  cyclobenzaprine (FLEXERIL) 10 MG tablet Take 10 mg by mouth 3 (three) times daily as needed for muscle spasms.    [provider]  EPINEPHrine 0.3 mg/0.3 mL IJ SOAJ injection Inject 0.3 mLs (0.3 mg total) into the muscle as needed for anaphylaxis. 08/08/18   Dara Hoyer, FNP  ezetimibe (ZETIA) 10 MG tablet Take 10 mg by mouth daily.    [provider]  fish oil-omega-3 fatty acids 1000 MG capsule Take 1 g by mouth daily.     [provider]  fluticasone (FLONASE) 50 MCG/ACT nasal spray Place into both nostrils daily.    [provider]  furosemide (LASIX) 40 MG tablet TAKE 1 TABLET(40 MG) BY MOUTH TWICE DAILY AT 10:00AM AND AT 4:00PM Patient taking differently: Take 40 mg by mouth daily. 05/23/20   Cantwell, Celeste C, PA-C  gabapentin (NEURONTIN) 100 MG capsule Take 100 mg by mouth 3 (three) times daily.    [provider]  galantamine (RAZADYNE ER) 8 MG 24 hr capsule Take 8 mg by mouth Daily. 02/26/11   [provider]  HONEY BEE VENOM IJ Inject 2 each as directed See admin instructions. Every eight weeks    [provider]  HYDROcodone-acetaminophen (NORCO/VICODIN) 5-325 MG tablet Take 1 tablet by mouth every 6 (six) hours as needed for severe pain. 12/10/19   [provider]  isosorbide mononitrate (IMDUR) 60 MG 24 hr tablet TAKE 1 TABLET(60 MG) BY MOUTH DAILY AFTER SUPPER 05/23/20   Adrian Prows, MD  leflunomide (ARAVA) 20 MG tablet Take 20 mg by mouth daily.    [provider]  magnesium gluconate (MAGONATE) 500 MG tablet Take 500 mg by mouth 2 (two) times daily.    [provider]  memantine (NAMENDA) 10 MG tablet Take 10 mg by mouth 2 (two) times  daily. 09/08/19   [provider]  nitroGLYCERIN (NITROSTAT) 0.4 MG SL tablet Place 1 tablet (0.4 mg total) under the tongue every 5 (five) minutes x 3 doses as needed for chest pain. 12/16/19   Cantwell, Celeste C, PA-C  pantoprazole (PROTONIX) 40 MG tablet Take 40 mg by mouth every other day.  10/28/14   [provider]  potassium chloride (MICRO-K) 10 MEQ CR capsule TAKE 1 CAPSULE BY MOUTH DAILY WITH LASIX Patient taking differently: Take 10 mEq by mouth daily. 02/05/20   Cantwell, Celeste C, PA-C  predniSONE (DELTASONE) 1 MG tablet Take 5 mg by mouth daily with breakfast.    [provider]  PRESCRIPTION MEDICATION Cpap    [provider]  Psyllium Husk POWD Take 1 Scoop by mouth daily.     [provider]  Rivaroxaban (XARELTO) 15 MG TABS tablet Take 15 mg by mouth daily with supper.    [provider]  triamcinolone cream (KENALOG) 0.1 % Apply 1 application topically as needed (skin irritation).     [provider]  vitamin B-12 (CYANOCOBALAMIN) 1000 MCG tablet Take 1,000 mcg by mouth daily.     [provider]    Physical Exam: Vitals:   08/17/20 1200 08/17/20 1205 08/17/20 1230 08/17/20 1300  BP: 115/73 115/73 136/71 (!) 142/81  Pulse: 93  65 85  Resp: (!) 21  (!) 21 (!) 24  Temp:      TempSrc:      SpO2: 95%  93% 94%  Weight:      Height:         General:  Appears calm and comfortable and is in NAD Eyes:  PERRL, EOMI, normal lids, iris ENT:  grossly normal hearing, lips & tongue, mmm; appropriate dentition Neck:  no LAD, masses or thyromegaly; no carotid bruits Cardiovascular:  irregularly, irregular, no m/r/g. Bilateral pitting edema to below the knee. R>L.  Respiratory:   CTA bilaterally with no wheezes/rales/rhonchi.  Normal respiratory effort. Abdomen:  soft, TTP over left upper quadrant and periumbilical area, ND, NABS Back:   did not examine.  Skin:  no rash or induration seen on limited exam. Has a  chronic ulcer on his right heel that will fill with blood then rupture. No vesicle on exam.  Musculoskeletal:  grossly normal tone BUE/BLE, good ROM, no bony abnormality Lower extremity:    Limited foot exam with no ulcerations.  2+ distal pulses. Psychiatric:  grossly normal mood and affect, speech fluent and appropriate, AOx3 Neurologic:  CN 2-12 grossly intact, moves all extremities in coordinated fashion, sensation intact    Radiological Exams on Admission: Independently reviewed - see discussion in A/P where applicable  CT HEAD WO CONTRAST (5MM)  Result Date: 08/17/2020 CLINICAL DATA:  Head trauma.  Fall yesterday. EXAM: CT HEAD WITHOUT CONTRAST TECHNIQUE: Contiguous axial images were obtained from the base of the skull through the vertex without intravenous contrast. COMPARISON:  06/28/2017 FINDINGS: Brain: No evidence of acute infarction, hemorrhage, hydrocephalus, extra-axial collection or mass lesion/mass effect. Prominence of the sulci and ventricles compatible with brain atrophy. Vascular: No hyperdense vessel or unexpected calcification. Skull: Normal. Negative for fracture or focal lesion. Sinuses/Orbits: No acute finding. Other: None IMPRESSION: 1. No acute intracranial abnormalities. 2. Prominence of the sulci and ventricles compatible with age related brain atrophy. Electronically Signed   By: Kerby Moors M.D.   On: 08/17/2020 11:02   CT Chest Wo Contrast  Result Date: 08/17/2020 CLINICAL DATA:  Midthoracic pain and back pain after falling yesterday. Evaluate for spinal fracture  and/or aspiration pneumonia. On blood thinners. EXAM: CT CHEST WITHOUT CONTRAST TECHNIQUE: Multidetector CT imaging of the chest was performed following the standard protocol without IV contrast. COMPARISON:  Chest CTA 10/13/2019.  Radiographs 08/17/2020. FINDINGS: Cardiovascular: Diffuse atherosclerosis of the aorta, great vessels and coronary arteries status post median sternotomy, CABG and TAVR. No  acute vascular findings on noncontrast imaging. No evidence mediastinal hematoma. The heart size is normal. There is no pericardial effusion. Mediastinum/Nodes: There are no enlarged mediastinal, hilar or axillary lymph nodes.Small mediastinal lymph nodes are stable. The thyroid gland, trachea and esophagus demonstrate no significant findings. Lungs/Pleura: There are small to moderate dependent bilateral pleural effusions with associated patchy dependent pulmonary opacities bilaterally. In addition, there are non dependent airspace opacities at both lung apices with additional scattered ground-glass opacities in both lungs. There is mild diffuse central airway thickening. No endobronchial lesion or suspicious pulmonary nodule. No pneumothorax. Upper abdomen: No acute findings within the visualized upper abdomen. Musculoskeletal/Chest wall: Status post median sternotomy. No evidence of acute rib fracture. Spinal findings are dictated separately; there is a nondisplaced horizontal fracture through the T8 vertebral body. No evidence of chest wall mass or hematoma. IMPRESSION: 1. Bilateral pleural effusions with multifocal patchy airspace and ground-glass opacities in both lungs, likely indicative of pulmonary edema or multilobar pneumonia. Pattern is atypical for aspiration. 2. No acute vascular findings on noncontrast imaging. No evidence of mediastinal hematoma. 3. Extensive coronary and aortic atherosclerosis post CABG and TAVR. 4. Nondisplaced horizontal fracture through the T8 vertebral body; spinal details dictated separately. No acute osseous findings evident. Electronically Signed   By: Richardean Sale M.D.   On: 08/17/2020 11:18   CT Lumbar Spine Wo Contrast  Result Date: 08/17/2020 CLINICAL DATA:  Fall yesterday. Back pain and shortness of breath. On blood thinners. EXAM: CT THORACIC AND LUMBAR SPINE WITHOUT CONTRAST TECHNIQUE: Multidetector CT imaging of the thoracic and lumbar spine was performed  without contrast. Multiplanar CT image reconstructions were also generated. COMPARISON:  Chest CT same date. Lumbar MRI 01/08/2020. CTA of the chest and abdomen 10/13/2019. FINDINGS: CT THORACIC SPINE FINDINGS Alignment: Normal. Vertebrae: The bones are demineralized. There is diffuse ankylosis of the thoracic spine. There is high suspicion of a nondisplaced nearly horizontal fracture through the T8 vertebral body, best seen on the reformatted images. No clear extension to the posterior elements identified, although there is questionable involvement of the posterior elements bilaterally at T9. No displaced fractures or traumatic subluxation identified. Paraspinal and other soft tissues: No acute paraspinal findings. Bilateral pleural effusions and bilateral pulmonary opacities, further described on chest CT. Disc levels: No large disc herniation, thoracic spinal stenosis or significant foraminal compromise. CT LUMBAR SPINE FINDINGS Segmentation: There are 5 lumbar type vertebral bodies. Alignment: Normal. Vertebrae: Patient has undergone spinal augmentation at L1 (on 02/23/2020). Underlying fracture results in approximately 50% loss of vertebral body height. There is no associated osseous retropulsion. No evidence of acute lumbar spine fracture. Paraspinal and other soft tissues: No acute paraspinal findings. Diffuse aortic and branch vessel atherosclerosis. Multifocal aneurysmal dilatation of the abdominal aorta with multiple saccular components and short segment dissections. The aorta measures up to 4.4 cm AP on image 78/4. This compares with 4.0 cm previously. No evidence of retroperitoneal hematoma. The bladder is mildly distended. Disc levels: Mild superimposed multilevel spondylosis with disc space narrowing and endplate osteophytes greatest at L5-S1. Mild facet degenerative changes. No high-grade spinal stenosis. IMPRESSION: CT THORACIC SPINE IMPRESSION 1. Diffuse ankylosis of the thoracic spine. 2.  Nondisplaced nearly horizontal fracture through the T8 vertebral body with potential posterior extension into the posterior elements at T9. Given the patient's underlying rigid spine, this may reflect an unstable fracture. Consider further evaluation with thoracic MRI. 3. No evidence of thoracic paraspinal hematoma. CT LUMBAR SPINE IMPRESSION 1. No acute lumbar spine findings. 2. Recent spinal augmentation at L1. 3. Extensive aortic atherosclerosis with multifocal aneurysmal dilatation and short segment dissection, mildly progressed from CTA 10 months ago. Consider follow-up CTA as clinically warranted. 4. These results were called by telephone at the time of interpretation on 08/17/2020 at 11:16 am to provider [facility] , who verbally acknowledged these results. Electronically Signed   By: Richardean Sale M.D.   On: 08/17/2020 11:17   CT T-SPINE NO CHARGE  Result Date: 08/17/2020 CLINICAL DATA:  Fall yesterday. Back pain and shortness of breath. On blood thinners. EXAM: CT THORACIC AND LUMBAR SPINE WITHOUT CONTRAST TECHNIQUE: Multidetector CT imaging of the thoracic and lumbar spine was performed without contrast. Multiplanar CT image reconstructions were also generated. COMPARISON:  Chest CT same date. Lumbar MRI 01/08/2020. CTA of the chest and abdomen 10/13/2019. FINDINGS: CT THORACIC SPINE FINDINGS Alignment: Normal. Vertebrae: The bones are demineralized. There is diffuse ankylosis of the thoracic spine. There is high suspicion of a nondisplaced nearly horizontal fracture through the T8 vertebral body, best seen on the reformatted images. No clear extension to the posterior elements identified, although there is questionable involvement of the posterior elements bilaterally at T9. No displaced fractures or traumatic subluxation identified. Paraspinal and other soft tissues: No acute paraspinal findings. Bilateral pleural effusions and bilateral pulmonary opacities, further described on chest CT. Disc  levels: No large disc herniation, thoracic spinal stenosis or significant foraminal compromise. CT LUMBAR SPINE FINDINGS Segmentation: There are 5 lumbar type vertebral bodies. Alignment: Normal. Vertebrae: Patient has undergone spinal augmentation at L1 (on 02/23/2020). Underlying fracture results in approximately 50% loss of vertebral body height. There is no associated osseous retropulsion. No evidence of acute lumbar spine fracture. Paraspinal and other soft tissues: No acute paraspinal findings. Diffuse aortic and branch vessel atherosclerosis. Multifocal aneurysmal dilatation of the abdominal aorta with multiple saccular components and short segment dissections. The aorta measures up to 4.4 cm AP on image 78/4. This compares with 4.0 cm previously. No evidence of retroperitoneal hematoma. The bladder is mildly distended. Disc levels: Mild superimposed multilevel spondylosis with disc space narrowing and endplate osteophytes greatest at L5-S1. Mild facet degenerative changes. No high-grade spinal stenosis. IMPRESSION: CT THORACIC SPINE IMPRESSION 1. Diffuse ankylosis of the thoracic spine. 2. Nondisplaced nearly horizontal fracture through the T8 vertebral body with potential posterior extension into the posterior elements at T9. Given the patient's underlying rigid spine, this may reflect an unstable fracture. Consider further evaluation with thoracic MRI. 3. No evidence of thoracic paraspinal hematoma. CT LUMBAR SPINE IMPRESSION 1. No acute lumbar spine findings. 2. Recent spinal augmentation at L1. 3. Extensive aortic atherosclerosis with multifocal aneurysmal dilatation and short segment dissection, mildly progressed from CTA 10 months ago. Consider follow-up CTA as clinically warranted. 4. These results were called by telephone at the time of interpretation on 08/17/2020 at 11:16 am to provider [facility] , who verbally acknowledged these results. Electronically Signed   By: Richardean Sale M.D.   On:  08/17/2020 11:17   DG Chest Portable 1 View  Result Date: 08/17/2020 CLINICAL DATA:  Near syncope, fall, initial encounter. EXAM: PORTABLE CHEST 1 VIEW COMPARISON:  11/10/2019 and CT chest 10/13/2019. FINDINGS: Trachea  is midline. Heart size stable. Thoracic aorta is calcified. Mixed interstitial and airspace opacification bilaterally. Possible nodular consolidation in the apical left upper lobe. No definite pleural fluid. IMPRESSION: 1. Pulmonary edema. 2. Possible nodular consolidation in the left upper lobe. Followup PA and lateral chest X-ray is recommended in 3-4 weeks following trial of antibiotic therapy to ensure resolution and exclude underlying malignancy, as clinically warranted. Electronically Signed   By: Lorin Picket M.D.   On: 08/17/2020 10:01    EKG: Independently reviewed.  Atrial fibrillation with rate 63, RBBB; nonspecific ST changes with no evidence of acute ischemia   Labs on Admission: I have personally reviewed the available labs and imaging studies at the time of the admission.  Pertinent labs:  creatinine: 1.40,  hgb: 9.0,  platelets of 85,  troponin: 53, delta pending,  BNP: 247    Assessment/Plan Principal Problem:   Multifocal pneumonia Patient with findings of hypoxia to the 80s with EMS, but has been satting 94% or greater since in ER on room air. Question if more bilateral pleural effusions from CHF.  He has no white count, fever, cough, shortness of breath.  Treated for aspiration pneumonia in ER, but I do not suspect this at this point nor would you likely see findings less than 24 hours from fall. Change his abx to azithromycin. Has received cefotaxime in ER and will let day team change to rocephin tomorrow AM after clinical exam.  Blood cultures pending and added on procalcitonin Continue to follow clinically and tailor abx as needed.   Active Problems: T8 vertebral fracture (HCC) MRI shows no unstable fracture.  Will continue brace and pain  control with Tylenol and hydrocodone Likely benefit from physical therapy Continue to monitor pain control  Acute on chronic diastolic heart failure -he has no weight gain, shortness of breath or leg swelling from baseline, but does feel like he is not having as good of urine output CXR with pulmonary edema.  -BNP slightly elevated -will give him one dose of IV lasix today and then can resume his home oral dose tomorrow vs. IV per day team after assessing clinical status and I/O.  -follow I/O and daily weights -last echo: 06/2020 with EF of 64%. Grade 2 diastolic dysfunction  -follow potassium tomorrow after IV lasix. Resumed home potassium.   Hypermagnesium -held home replacement that he takes daily  -repeat in AM.    CAD (coronary artery disease), native coronary artery s/p CABG x 3 Troponins flat and ekg with no findings Continue telemetry and home medication    Paroxysmal atrial fibrillation (HCC) Rate controlled, continue home amiodarone Telemetry Continue xarelto, follow platelets.  -assess fall risk as well since he has had falls.     Thrombocytopenia ([place]) Has been followed outpatient by pcp. No hematology visit per wife.  Rheumatology adjusted some medication per wife.  Continue to monitor, may benefit from outpatient heme visit.     Elevated troponin Flat trend with no ekg changes or cardiac chest pain.  Likely in setting of demand ischemia with fall and possible CHF exac.  Continue telemetry and to follow    Hypertension Well controlled  Continue home medication: norvasc 10mg /day, hydralazine 25mg  bid.     CKD (chronic kidney disease) stage 3, GFR 30-59 ml/min (HCC) Stable around his baseline (1.65-1.77) Continue to monitor     Hyperlipidemia Continue home zetia Per cardiology note this was stopped in 06/2020, but is on his MAR. Would f/u with wife to make sure he  is taking this.     Thoracic ascending aortic aneurysm ([place]) Slightly progressed since last  imaging.  Is followed routinely outpatient by Dr. Lilly Cove.    OSA (obstructive sleep apnea)  Continue home cpap   PMR Chronic steroids, continue daily  Continue his leflunomide   S/p TAVR in 11/21 Stable valve on echo in 06/2020  Memory loss Continue namenda and galantamine   Had complaints of hard time urinating later on in evening. When I went to assess him he has tenderness suprapubically. Had just urinated 200cc, but states it felt like he had to strain to go to the bathroom. No dysuria, frequency or urgency.  Ordered a bladder scan which was zero. PSA pending and UA pending.  Will continue to follow clinically.   Body mass index is 29.29 kg/m.    Level of care: Telemetry Medical DVT prophylaxis:  xarelto  Code Status: DNR  - confirmed with patient Family Communication: wife present at bedside: [place] Disposition Plan:  The patient is from: home  Anticipated d/c is to: home  Requires inpatient hospitalization and is at significant risk of worsening, requires constant monitoring, assessment and MDM with specialists.  Patient is currently: acutely ill Consults called: none   Admission status:  observation    Orma Flaming MD [facility]   How to contact the [facility] Attending or Consulting provider [place] or covering provider during after hours [place], for this patient?  Check the care team in [facility] and look for a) attending/consulting [facility] provider listed and b) the [facility] team listed Log into [email or web address] and use [place]'s universal password to access. If you do not have the password, please contact the hospital operator. Locate the [facility] provider you are looking for under [facility] and page to a number that you can be directly reached. If you still have difficulty reaching the provider, please page the [facility] (Director on Call) for the Hospitalists listed on amion for assistance.   08/17/2020, 1:49 PM

## 2020-08-17 NOTE — Progress Notes (Signed)
Orthopedic Tech Progress Note Patient Details:  [name] 20-Nov-1926 [id number]  Called in order to [facility] for a TLSO/ VERTALIGN Patient ID: [name] [name], male   DOB: 10-18-1926, 87 y.o.   MRN: [id number]  [name] [name] 08/17/2020, 12:22 PM

## 2020-08-17 NOTE — ED Notes (Signed)
Wound RN here to see pt, pt currently unavailable. She will try to come back today if not he will assessed by wound care tomorrow

## 2020-08-17 NOTE — ED Notes (Signed)
Pt's brief wet so taken off and male external catheter placed. Pt denies further needs.

## 2020-08-17 NOTE — ED Notes (Signed)
Call placed to ortho for TLSO brace

## 2020-08-17 NOTE — ED Triage Notes (Signed)
Pt here by [facility] after a fall yesterday around 1500; pt c/o SOB this am and pain under right ribs; O2 in 80s upon EMS arrival; placed on 6L up to 96%; PT TAKES Xarelto

## 2020-08-17 NOTE — Consult Note (Signed)
WOC Nurse Consult Note: Attempted to see patient in [facility], however, patient is in MRI. It may be tomorrow before the patient is seen. Val Riles, RN, MSN, CWOCN, CNS-BC, pager [phone]

## 2020-08-17 NOTE — ED Notes (Signed)
Pt thought he needed to have a BM. Was placed on bedpan, but wasn't able to go. External catheter had come undone so urine had leaked all over bed. Total bed change completed, peri-care done and pt given a new blanket and new brief. Pt repositioned in bed. Denies further needs.

## 2020-08-17 NOTE — ED Notes (Signed)
EDP at bedside for MSE.  

## 2020-08-18 DIAGNOSIS — G4733 Obstructive sleep apnea (adult) (pediatric): Secondary | ICD-10-CM | POA: Diagnosis present

## 2020-08-18 DIAGNOSIS — I10 Essential (primary) hypertension: Secondary | ICD-10-CM | POA: Diagnosis not present

## 2020-08-18 DIAGNOSIS — E785 Hyperlipidemia, unspecified: Secondary | ICD-10-CM | POA: Diagnosis present

## 2020-08-18 DIAGNOSIS — N138 Other obstructive and reflux uropathy: Secondary | ICD-10-CM | POA: Diagnosis present

## 2020-08-18 DIAGNOSIS — Z20822 Contact with and (suspected) exposure to covid-19: Secondary | ICD-10-CM | POA: Diagnosis present

## 2020-08-18 DIAGNOSIS — D696 Thrombocytopenia, unspecified: Secondary | ICD-10-CM | POA: Diagnosis present

## 2020-08-18 DIAGNOSIS — G894 Chronic pain syndrome: Secondary | ICD-10-CM | POA: Diagnosis present

## 2020-08-18 DIAGNOSIS — N1831 Chronic kidney disease, stage 3a: Secondary | ICD-10-CM | POA: Diagnosis present

## 2020-08-18 DIAGNOSIS — E876 Hypokalemia: Secondary | ICD-10-CM | POA: Diagnosis not present

## 2020-08-18 DIAGNOSIS — F0390 Unspecified dementia without behavioral disturbance: Secondary | ICD-10-CM | POA: Diagnosis present

## 2020-08-18 DIAGNOSIS — M8008XA Age-related osteoporosis with current pathological fracture, vertebra(e), initial encounter for fracture: Secondary | ICD-10-CM | POA: Diagnosis present

## 2020-08-18 DIAGNOSIS — D631 Anemia in chronic kidney disease: Secondary | ICD-10-CM | POA: Diagnosis present

## 2020-08-18 DIAGNOSIS — I700 Atherosclerosis of aorta: Secondary | ICD-10-CM | POA: Diagnosis present

## 2020-08-18 DIAGNOSIS — R778 Other specified abnormalities of plasma proteins: Secondary | ICD-10-CM | POA: Diagnosis not present

## 2020-08-18 DIAGNOSIS — I480 Paroxysmal atrial fibrillation: Secondary | ICD-10-CM | POA: Diagnosis not present

## 2020-08-18 DIAGNOSIS — Z952 Presence of prosthetic heart valve: Secondary | ICD-10-CM | POA: Diagnosis not present

## 2020-08-18 DIAGNOSIS — I5043 Acute on chronic combined systolic (congestive) and diastolic (congestive) heart failure: Secondary | ICD-10-CM | POA: Diagnosis present

## 2020-08-18 DIAGNOSIS — I714 Abdominal aortic aneurysm, without rupture: Secondary | ICD-10-CM | POA: Diagnosis present

## 2020-08-18 DIAGNOSIS — T1490XA Injury, unspecified, initial encounter: Secondary | ICD-10-CM | POA: Diagnosis present

## 2020-08-18 DIAGNOSIS — J9601 Acute respiratory failure with hypoxia: Secondary | ICD-10-CM | POA: Diagnosis not present

## 2020-08-18 DIAGNOSIS — M353 Polymyalgia rheumatica: Secondary | ICD-10-CM | POA: Diagnosis present

## 2020-08-18 DIAGNOSIS — I130 Hypertensive heart and chronic kidney disease with heart failure and stage 1 through stage 4 chronic kidney disease, or unspecified chronic kidney disease: Secondary | ICD-10-CM | POA: Diagnosis present

## 2020-08-18 DIAGNOSIS — I2510 Atherosclerotic heart disease of native coronary artery without angina pectoris: Secondary | ICD-10-CM | POA: Diagnosis present

## 2020-08-18 DIAGNOSIS — I5033 Acute on chronic diastolic (congestive) heart failure: Secondary | ICD-10-CM | POA: Diagnosis not present

## 2020-08-18 DIAGNOSIS — S22060D Wedge compression fracture of T7-T8 vertebra, subsequent encounter for fracture with routine healing: Secondary | ICD-10-CM | POA: Diagnosis not present

## 2020-08-18 DIAGNOSIS — E669 Obesity, unspecified: Secondary | ICD-10-CM | POA: Diagnosis present

## 2020-08-18 DIAGNOSIS — J189 Pneumonia, unspecified organism: Secondary | ICD-10-CM | POA: Diagnosis not present

## 2020-08-18 DIAGNOSIS — I712 Thoracic aortic aneurysm, without rupture: Secondary | ICD-10-CM | POA: Diagnosis present

## 2020-08-18 DIAGNOSIS — E663 Overweight: Secondary | ICD-10-CM | POA: Diagnosis not present

## 2020-08-18 DIAGNOSIS — Z66 Do not resuscitate: Secondary | ICD-10-CM | POA: Diagnosis present

## 2020-08-18 DIAGNOSIS — Z6830 Body mass index (BMI) 30.0-30.9, adult: Secondary | ICD-10-CM | POA: Diagnosis not present

## 2020-08-18 LAB — PROCALCITONIN: Procalcitonin: <0.1 ng/mL

## 2020-08-18 LAB — BASIC METABOLIC PANEL
Anion gap: 9 (ref 5–15)
BUN: 17 mg/dL (ref 8–23)
CO2: 24 mmol/L (ref 22–32)
Calcium: 8.5 mg/dL — ABNORMAL LOW (ref 8.9–10.3)
Chloride: 104 mmol/L (ref 98–111)
Creatinine, Ser: 1.37 mg/dL — ABNORMAL HIGH (ref 0.61–1.24)
GFR, Estimated: 48 mL/min — ABNORMAL LOW (ref >60)
Glucose, Bld: 119 mg/dL — ABNORMAL HIGH (ref 70–99)
Potassium: 3.2 mmol/L — ABNORMAL LOW (ref 3.5–5.1)
Sodium: 137 mmol/L (ref 135–145)

## 2020-08-18 LAB — MAGNESIUM: Magnesium: 2.4 mg/dL (ref 1.7–2.4)

## 2020-08-18 MED ORDER — DOCUSATE SODIUM 100 MG PO CAPS
100.0000 mg | ORAL_CAPSULE | Freq: Two times a day (BID) | ORAL | Status: DC
  Administered 2020-08-18 – 2020-08-23 (×11): 100 mg via ORAL
  Filled 2020-08-18 (×12): qty 1

## 2020-08-18 MED ORDER — FUROSEMIDE 40 MG PO TABS
40.0000 mg | ORAL_TABLET | Freq: Two times a day (BID) | ORAL | Status: DC
  Administered 2020-08-18 – 2020-08-20 (×5): 40 mg via ORAL
  Filled 2020-08-18 (×2): qty 1
  Filled 2020-08-18: qty 2
  Filled 2020-08-18 (×2): qty 1

## 2020-08-18 MED ORDER — POTASSIUM CHLORIDE CRYS ER 20 MEQ PO TBCR
40.0000 meq | EXTENDED_RELEASE_TABLET | Freq: Two times a day (BID) | ORAL | Status: AC
  Administered 2020-08-18 – 2020-08-19 (×4): 40 meq via ORAL
  Filled 2020-08-18 (×4): qty 2

## 2020-08-18 MED ORDER — GABAPENTIN 100 MG PO CAPS
200.0000 mg | ORAL_CAPSULE | Freq: Three times a day (TID) | ORAL | Status: DC
  Administered 2020-08-18 – 2020-08-23 (×15): 200 mg via ORAL
  Filled 2020-08-18 (×15): qty 2

## 2020-08-18 NOTE — ED Notes (Signed)
Pt appears to be asleep. NAD noted. 

## 2020-08-18 NOTE — Consult Note (Signed)
[place] Nurse Consult Note: Patient receiving care in [place] Reason for Consult: blister right heel Wound type: Partial thickness wound to the right heel. Measures approx. 1 x 1 and is pink and dry. BLE are dry and flaky Pressure Injury POA: NA Drainage (amount, consistency, odor) None Periwound: Dry intact Dressing procedure/placement/frequency: Place heel foam dressing Kellie Simmering # [phone]) on bilateral heels. Change every 3 days. Pull back foam dressing and assess heels each shift and document findings.   Pressure Injury Prevention Bundle May use any that apply to this patient Support surfaces (air mattress) chair cushion Kellie Simmering # [phone]) Heel offloading boots Kellie Simmering # [phone]) Turning and Positioning  Measures to reduce shear (draw sheet, knees up) Skin protection Products (Foam dressing) Moisture management products (Critic-Aid Barrier Cream (Purple top) Nutrition Management Protection for Medical Devices Routine Skin Assessment   Monitor the wound area(s) for worsening of condition such as: Signs/symptoms of infection, increase in size, development of or worsening of odor, development of pain, or increased pain at the affected locations.   Notify the medical team if any of these develop.  Thank you for the consult. [place] nurse will not follow at this time.   Please re-consult the [place] team if needed.  Cathlean Marseilles Tamala Julian, MSN, RN, [place], Lysle Pearl, [facility] Wound Treatment Associate Pager [phone]

## 2020-08-18 NOTE — TOC CAGE-AID Note (Signed)
Transition of Care [facility]) - CAGE-AID Screening   Patient Details  Name: [name] [name] MRN: [id number] Date of Birth: 06/23/1926  Transition of Care [facility]) CM/SW Contact:    Clovis Cao, RN Phone Number: [phone] 08/18/2020, 8:57 AM   Clinical Narrative: Pt denies alcohol or drug abuse.   CAGE-AID Screening:    Have You Ever Felt You Ought to Cut Down on Your Drinking or Drug Use?: No Have People Annoyed You By Critizing Your Drinking Or Drug Use?: No Have You Felt Bad Or Guilty About Your Drinking Or Drug Use?: No Have You Ever Had a Drink or Used Drugs First Thing In The Morning to Steady Your Nerves or to Get Rid of a Hangover?: No CAGE-AID Score: 0  Substance Abuse Education Offered: No

## 2020-08-18 NOTE — Evaluation (Signed)
Physical Therapy Evaluation Patient Details Name: [name] [name] MRN: [id number] DOB: July 23, 1926 Today's Date: 08/18/2020   History of Present Illness  Pt is a 87 y/o male admitted following fall. Found to have T8 fx to be managed conservatively. PMH includes CAD s/p CABG, dCHF, HTN, a fib, CKD, s/p TAVR.  Clinical Impression  Pt admitted secondary to problem above with deficits below. Pt very limited by pain this session and only able to come up to partial sitting before requesting to return to supine. Required max A to attempt bed mobility. Educated about back precautions. Feel pt is at increased risk for fall and will have increased difficulty with mobility tasks. Recommending SNF level therapies at d/c to increase independence and safety. Will continue to follow acutely.     Follow Up Recommendations SNF    Equipment Recommendations  Wheelchair (measurements PT);Wheelchair cushion (measurements PT)    Recommendations for Other Services OT consult     Precautions / Restrictions Precautions Precautions: Back Precaution Booklet Issued: No Precaution Comments: Verbally reviewed back precautions. Required Braces or Orthoses: Spinal Brace Spinal Brace: Thoracolumbosacral orthotic Restrictions Weight Bearing Restrictions: No      Mobility  Bed Mobility Overal bed mobility: Needs Assistance Bed Mobility: Supine to Sit     Supine to sit: Max assist     General bed mobility comments: Max A to attempt bed mobility. Was able to come to partial sitting, however, pt with increased pain and requesting to return to supine. Further mobility deferred.    Transfers                    Ambulation/Gait                Stairs            Wheelchair Mobility    Modified Rankin (Stroke Patients Only)       Balance                                             Pertinent Vitals/Pain Pain Assessment: 0-10 Pain Score: 8  Pain Location: back Pain  Descriptors / Indicators: Aching Pain Intervention(s): Limited activity within patient's tolerance;Monitored during session;Repositioned    Home Living Family/patient expects to be discharged to:: Private residence Living Arrangements: Spouse/significant other Available Help at Discharge: Family;Available 24 hours/day Type of Home: House Home Access: Stairs to enter Entrance Stairs-Rails: Left;Right;Can reach both Entrance Stairs-Number of Steps: 5 Home Layout: Two level;Able to live on main level with bedroom/bathroom Home Equipment: Gilford Rile - 2 wheels;Walker - 4 wheels;Grab bars - toilet;Grab bars - tub/shower;Shower seat      Prior Function Level of Independence: Needs assistance   Gait / Transfers Assistance Needed: Needs assist to get into/out of bed. Normally uses rollator  ADL's / Homemaking Assistance Needed: Wife assists as needed        Hand Dominance        Extremity/Trunk Assessment   Upper Extremity Assessment Upper Extremity Assessment: Defer to OT evaluation    Lower Extremity Assessment Lower Extremity Assessment: Generalized weakness (Increased pain noted with heel slides)    Cervical / Trunk Assessment Cervical / Trunk Assessment: Other exceptions Cervical / Trunk Exceptions: T8 fx in TLSO throughout  Communication   Communication: No difficulties  Cognition Arousal/Alertness: Awake/alert Behavior During Therapy: WFL for tasks assessed/performed Overall Cognitive Status: No family/caregiver present to  determine baseline cognitive functioning                                        General Comments      Exercises     Assessment/Plan    PT Assessment Patient needs continued PT services  PT Problem List Decreased strength;Decreased activity tolerance;Decreased mobility;Decreased balance;Decreased safety awareness;Decreased knowledge of precautions;Pain       PT Treatment Interventions DME instruction;Gait training;Stair  training;Functional mobility training;Therapeutic activities;Therapeutic exercise;Balance training;Patient/family education    PT Goals (Current goals can be found in the Care Plan section)  Acute Rehab PT Goals Patient Stated Goal: To decrease pain PT Goal Formulation: With patient Time For Goal Achievement: 09/01/20 Potential to Achieve Goals: Good    Frequency Min 2X/week   Barriers to discharge        Co-evaluation               AM-PAC PT "6 Clicks" Mobility  Outcome Measure Help needed turning from your back to your side while in a flat bed without using bedrails?: A Lot Help needed moving from lying on your back to sitting on the side of a flat bed without using bedrails?: Total Help needed moving to and from a bed to a chair (including a wheelchair)?: Total Help needed standing up from a chair using your arms (e.g., wheelchair or bedside chair)?: Total Help needed to walk in hospital room?: Total Help needed climbing 3-5 steps with a railing? : Total 6 Click Score: 7    End of Session Equipment Utilized During Treatment: Back brace Activity Tolerance: Patient limited by pain Patient left: in bed;with call bell/phone within reach (on stretcher in ED) Nurse Communication: Mobility status PT Visit Diagnosis: Other abnormalities of gait and mobility (R26.89);History of falling (Z91.81);Difficulty in walking, not elsewhere classified (R26.2);Pain Pain - part of body:  (back)    Time: 9518-8416 PT Time Calculation (min) (ACUTE ONLY): 18 min   Charges:   PT Evaluation $PT Eval Moderate Complexity: 1 Mod          Reuel Derby, PT, DPT  Acute Rehabilitation Services  Pager: [phone] Office: [phone]   Rudean Hitt 08/18/2020, 10:02 AM

## 2020-08-18 NOTE — ED Notes (Signed)
Provider at bedside

## 2020-08-18 NOTE — Progress Notes (Signed)
Pt refused CPAP for the night.   

## 2020-08-18 NOTE — ED Notes (Signed)
PT at bedside.

## 2020-08-18 NOTE — ED Notes (Signed)
Pt resting in bed comfortably. Says pain feels much better and he's going to try and go to sleep. Denies further needs.

## 2020-08-18 NOTE — Plan of Care (Signed)
  Problem: Clinical Measurements: Goal: Will remain free from infection Outcome: Progressing   Problem: Activity: Goal: Risk for activity intolerance will decrease Outcome: Progressing   Problem: Elimination: Goal: Will not experience complications related to bowel motility Outcome: Progressing

## 2020-08-18 NOTE — Progress Notes (Signed)
PROGRESS NOTE    [name] [name]  VFI:[id number] DOB: 06/25/1926 DOA: 08/17/2020 PCP: [name], MD    Brief Narrative:  87 year old gentleman with extensive medical history including paroxysmal A. fib on Xarelto, coronary artery disease status post CABG, severe aortic stenosis status post TAVR, obstructive sleep apnea, hypertension, hyperlipidemia, prediabetes, CKD stage IIIa, diastolic dysfunction, polymyalgia rheumatica on daily prednisone and chronic pain syndrome managed by pain management presented to emergency room with fall at home.  At baseline, he walks with a walker, he lost his balance and fell backwards onto the ground.  No loss of consciousness.  He had pain all over mostly entire chest and back.  Patient does have chronic back pain issues. At the emergency room hemodynamically stable.  Chest x-ray with pulmonary edema and possible nodular consolidation left upper lobe.  CT of the thoracic spine consistent with nondisplaced T8 vertebral fracture with posterior extension into posterior elements of T9.  MRI confirms stable fracture.  Admitted due to severe pain and difficulty to mobilize. CT scan of the chest with bilateral pleural effusion with multifocal patchy airspace and groundglass opacities in both lungs consistent with pulmonary edema.   Assessment & Plan:   Principal Problem:   Multifocal pneumonia Active Problems:   CAD (coronary artery disease), native coronary artery   Hyperlipidemia   Hypertension   Paroxysmal atrial fibrillation (HCC)   Thoracic ascending aortic aneurysm (HCC)   OSA (obstructive sleep apnea)   Acute on chronic diastolic heart failure (HCC)   T8 vertebral fracture (HCC)   Thrombocytopenia (HCC)   CKD (chronic kidney disease) stage 3, GFR 30-59 ml/min (HCC)   Elevated troponin   Vertebral fracture, osteoporotic (HCC)  Abnormal chest x-ray, suspected multifocal pneumonia: Patient with no obvious clinical evidence of pneumonia.  Without  leukocytosis.  Without fever or cough.  Discontinue antibiotics.  Since he is immunosuppressed with multiple medications, will treat for atypical pneumonia with azithromycin for 5 days.  T8 vertebral fracture: Stable vertebral fracture on MRI.  Multiple previous back pain issues and spine fractures. Pain management is going to be challenging with already compromised mobility and back pain issues along with constipation. Start mobilizing with PT OT. Wear TLSO brace while mobilizing with therapies, okay to leave out if uncomfortable.  Fracture is a stable. Pain management regimen Tylenol Norco, increase dose now with slow taper Flexeril as needed Gabapentin, increase dose to 200 mg 3 times a day.  We can further go up on gabapentin. Work with PT OT and referred to skilled nursing facility for rehab.  Chronic diastolic heart failure: Fairly stable today.  Resume home dose of Lasix with potassium supplements.  Hypermagnesemia: Over replaced.  Discontinue home medication.  Paroxysmal A. fib: Rate controlled.  On amiodarone.  Therapeutic on Xarelto.  Continue.  Hypertension: Blood pressures are stable.  He is on Norvasc and hydralazine.  CKD stage IIIa: Kidney functions are stable at about baseline.  Obstructive sleep apnea: Uses home CPAP.  Polymyalgia rheumatica: On chronic steroid therapy and leflunomide that he will continue.  Urinary obstruction: Currently without retention.  UA pending.  Scheduling follow-up with urology.  DVT prophylaxis:  Rivaroxaban (XARELTO) tablet 15 mg   Code Status: DNR Family Communication: Wife at the bedside Disposition Plan: Status is: Inpatient  Remains inpatient appropriate because:Unsafe d/c plan and Inpatient level of care appropriate due to severity of illness  Dispo: The patient is from: Home              Anticipated d/c  is to: SNF              Patient currently is not medically stable to d/c.   Difficult to place patient No          Consultants:  None  Procedures:  None  Antimicrobials:  Azithromycin 8/17----   Subjective: Patient seen and examined.  He was still in the emergency room.  Wife was at the bedside.  We discussed in detail about his symptomatology and management.  Increasing dose of pain medications along with laxatives. Patient was unable to get out of the bed and stand due to pain and discomfort. Was able to urinate but has a hesitancy. He was wondering whether he can go to his granddaughter's wedding next week. Agreeable to a SNF for rehab.  Objective: Vitals:   08/18/20 0600 08/18/20 0800 08/18/20 1000 08/18/20 1300  BP: (!) 123/92 120/75 104/60 110/65  Pulse: (!) 105 90 84 95  Resp: 18 17 18 11   Temp:  98.5 F (36.9 C)    TempSrc:  Oral    SpO2: 97% 96% 98% 98%  Weight:      Height:        Intake/Output Summary (Last 24 hours) at 08/18/2020 1431 Last data filed at 08/18/2020 1125 Gross per 24 hour  Intake 158.12 ml  Output 1300 ml  Net -1141.88 ml   Filed Weights   08/17/20 0924  Weight: 100.7 kg    Examination:  General exam: Appears calm and comfortable  Frail and debilitated.  Appropriate for age.  Not in any distress.  Anxious. Respiratory system: Clear to auscultation. Respiratory effort normal.  No added sounds. Cardiovascular system: S1 & S2 heard, RRR.  Gastrointestinal system: Abdomen is nondistended, soft and nontender. No organomegaly or masses felt. Normal bowel sounds heard. Central nervous system: Alert and oriented. No focal neurological deficits.  Generalized weakness.   Data Reviewed: I have personally reviewed following labs and imaging studies  CBC: Recent Labs  Lab 08/17/20 0912 08/17/20 1323 08/17/20 1932  WBC 5.5 5.2 6.2  NEUTROABS 3.7 3.7 4.8  HGB 9.0* 9.4* 8.6*  HCT 28.4* 30.2* 28.1*  MCV 98.3 99.0 100.7*  PLT 85* 89* 83*   Basic Metabolic Panel: Recent Labs  Lab 08/17/20 0912 08/17/20 1323 08/18/20 0325  NA 140  --  137  K  3.6  --  3.2*  CL 106  --  104  CO2 27  --  24  GLUCOSE 110*  --  119*  BUN 20  --  17  CREATININE 1.40*  --  1.37*  CALCIUM 8.7*  --  8.5*  MG  --  2.6* 2.4   GFR: Estimated Creatinine Clearance: 42.9 mL/min (A) (by C-G formula based on SCr of 1.37 mg/dL (H)). Liver Function Tests: No results for input(s): AST, ALT, ALKPHOS, BILITOT, PROT, ALBUMIN in the last 168 hours. No results for input(s): LIPASE, AMYLASE in the last 168 hours. No results for input(s): AMMONIA in the last 168 hours. Coagulation Profile: No results for input(s): INR, PROTIME in the last 168 hours. Cardiac Enzymes: Recent Labs  Lab 08/17/20 0912  CKTOTAL 38*   BNP (last 3 results) No results for input(s): PROBNP in the last 8760 hours. HbA1C: No results for input(s): HGBA1C in the last 72 hours. CBG: No results for input(s): GLUCAP in the last 168 hours. Lipid Profile: No results for input(s): CHOL, HDL, LDLCALC, TRIG, CHOLHDL, LDLDIRECT in the last 72 hours. Thyroid Function Tests: No results for input(s):  TSH, T4TOTAL, FREET4, T3FREE, THYROIDAB in the last 72 hours. Anemia Panel: No results for input(s): VITAMINB12, FOLATE, FERRITIN, TIBC, IRON, RETICCTPCT in the last 72 hours. Sepsis Labs: Recent Labs  Lab 08/17/20 1323 08/18/20 0325  PROCALCITON <0.10 <0.10    Recent Results (from the past 240 hour(s))  Resp Panel by RT-PCR (Flu A&B, Covid) Nasopharyngeal Swab     Status: None   Collection Time: 08/17/20 12:10 PM   Specimen: Nasopharyngeal Swab; Nasopharyngeal(NP) swabs in vial transport medium  Result Value Ref Range Status   SARS Coronavirus 2 by RT PCR NEGATIVE NEGATIVE Final    Comment: (NOTE) SARS-CoV-2 target nucleic acids are NOT DETECTED.  The SARS-CoV-2 RNA is generally detectable in upper respiratory specimens during the acute phase of infection. The lowest concentration of SARS-CoV-2 viral copies this assay can detect is 138 copies/mL. A negative result does not preclude  SARS-Cov-2 infection and should not be used as the sole basis for treatment or other patient management decisions. A negative result may occur with  improper specimen collection/handling, submission of specimen other than nasopharyngeal swab, presence of viral mutation(s) within the areas targeted by this assay, and inadequate number of viral copies(<138 copies/mL). A negative result must be combined with clinical observations, patient history, and epidemiological information. The expected result is Negative.  Fact Sheet for Patients:  EntrepreneurPulse.com.au  Fact Sheet for Healthcare Providers:  IncredibleEmployment.be  This test is no t yet approved or cleared by the [place] FDA and  has been authorized for detection and/or diagnosis of SARS-CoV-2 by FDA under an Emergency Use Authorization (EUA). This EUA will remain  in effect (meaning this test can be used) for the duration of the COVID-19 declaration under Section 564(b)(1) of the Act, 21 U.S.C.section 360bbb-3(b)(1), unless the authorization is terminated  or revoked sooner.       Influenza A by PCR NEGATIVE NEGATIVE Final   Influenza B by PCR NEGATIVE NEGATIVE Final    Comment: (NOTE) The Xpert Xpress SARS-CoV-2/FLU/RSV plus assay is intended as an aid in the diagnosis of influenza from Nasopharyngeal swab specimens and should not be used as a sole basis for treatment. Nasal washings and aspirates are unacceptable for Xpert Xpress SARS-CoV-2/FLU/RSV testing.  Fact Sheet for Patients: EntrepreneurPulse.com.au  Fact Sheet for Healthcare Providers: IncredibleEmployment.be  This test is not yet approved or cleared by the [place] FDA and has been authorized for detection and/or diagnosis of SARS-CoV-2 by FDA under an Emergency Use Authorization (EUA). This EUA will remain in effect (meaning this test can be used) for the duration of  the COVID-19 declaration under Section 564(b)(1) of the Act, 21 U.S.C. section 360bbb-3(b)(1), unless the authorization is terminated or revoked.  Performed at [facility], [place] [address] [place], [place] [address]   Culture, blood (routine x 2)     Status: None (Preliminary result)   Collection Time: 08/17/20  2:50 PM   Specimen: BLOOD LEFT FOREARM  Result Value Ref Range Status   Specimen Description BLOOD LEFT FOREARM  Final   Special Requests   Final    BOTTLES DRAWN AEROBIC AND ANAEROBIC Blood Culture adequate volume   Culture   Final    NO GROWTH < 24 HOURS Performed at [facility], [place] [address] [place], [place] [address]    Report Status PENDING  Incomplete  Culture, blood (routine x 2)     Status: None (Preliminary result)   Collection Time: 08/17/20  2:58 PM   Specimen: BLOOD  Result Value Ref Range Status   Specimen Description BLOOD LEFT ANTECUBITAL  Final   Special Requests   Final    BOTTLES DRAWN AEROBIC AND ANAEROBIC Blood Culture adequate volume   Culture   Final    NO GROWTH < 24 HOURS Performed at [facility], [address], [place], [place] [address]    Report Status PENDING  Incomplete         Radiology Studies: CT HEAD WO CONTRAST (5MM)  Result Date: 08/17/2020 CLINICAL DATA:  Head trauma.  Fall yesterday. EXAM: CT HEAD WITHOUT CONTRAST TECHNIQUE: Contiguous axial images were obtained from the base of the skull through the vertex without intravenous contrast. COMPARISON:  06/28/2017 FINDINGS: Brain: No evidence of acute infarction, hemorrhage, hydrocephalus, extra-axial collection or mass lesion/mass effect. Prominence of the sulci and ventricles compatible with brain atrophy. Vascular: No hyperdense vessel or unexpected calcification. Skull: Normal. Negative for fracture or focal lesion. Sinuses/Orbits: No acute finding. Other: None IMPRESSION: 1. No acute intracranial abnormalities. 2. Prominence of the sulci and ventricles  compatible with age related brain atrophy. Electronically Signed   By: Kerby Moors M.D.   On: 08/17/2020 11:02   CT Chest Wo Contrast  Result Date: 08/17/2020 CLINICAL DATA:  Midthoracic pain and back pain after falling yesterday. Evaluate for spinal fracture and/or aspiration pneumonia. On blood thinners. EXAM: CT CHEST WITHOUT CONTRAST TECHNIQUE: Multidetector CT imaging of the chest was performed following the standard protocol without IV contrast. COMPARISON:  Chest CTA 10/13/2019.  Radiographs 08/17/2020. FINDINGS: Cardiovascular: Diffuse atherosclerosis of the aorta, great vessels and coronary arteries status post median sternotomy, CABG and TAVR. No acute vascular findings on noncontrast imaging. No evidence mediastinal hematoma. The heart size is normal. There is no pericardial effusion. Mediastinum/Nodes: There are no enlarged mediastinal, hilar or axillary lymph nodes.Small mediastinal lymph nodes are stable. The thyroid gland, trachea and esophagus demonstrate no significant findings. Lungs/Pleura: There are small to moderate dependent bilateral pleural effusions with associated patchy dependent pulmonary opacities bilaterally. In addition, there are non dependent airspace opacities at both lung apices with additional scattered ground-glass opacities in both lungs. There is mild diffuse central airway thickening. No endobronchial lesion or suspicious pulmonary nodule. No pneumothorax. Upper abdomen: No acute findings within the visualized upper abdomen. Musculoskeletal/Chest wall: Status post median sternotomy. No evidence of acute rib fracture. Spinal findings are dictated separately; there is a nondisplaced horizontal fracture through the T8 vertebral body. No evidence of chest wall mass or hematoma. IMPRESSION: 1. Bilateral pleural effusions with multifocal patchy airspace and ground-glass opacities in both lungs, likely indicative of pulmonary edema or multilobar pneumonia. Pattern is atypical  for aspiration. 2. No acute vascular findings on noncontrast imaging. No evidence of mediastinal hematoma. 3. Extensive coronary and aortic atherosclerosis post CABG and TAVR. 4. Nondisplaced horizontal fracture through the T8 vertebral body; spinal details dictated separately. No acute osseous findings evident. Electronically Signed   By: Richardean Sale M.D.   On: 08/17/2020 11:18   CT Lumbar Spine Wo Contrast  Result Date: 08/17/2020 CLINICAL DATA:  Fall yesterday. Back pain and shortness of breath. On blood thinners. EXAM: CT THORACIC AND LUMBAR SPINE WITHOUT CONTRAST TECHNIQUE: Multidetector CT imaging of the thoracic and lumbar spine was performed without contrast. Multiplanar CT image reconstructions were also generated. COMPARISON:  Chest CT same date. Lumbar MRI 01/08/2020. CTA of the chest and abdomen 10/13/2019. FINDINGS: CT THORACIC SPINE FINDINGS Alignment: Normal. Vertebrae: The bones are demineralized. There is diffuse ankylosis of the thoracic spine.  There is high suspicion of a nondisplaced nearly horizontal fracture through the T8 vertebral body, best seen on the reformatted images. No clear extension to the posterior elements identified, although there is questionable involvement of the posterior elements bilaterally at T9. No displaced fractures or traumatic subluxation identified. Paraspinal and other soft tissues: No acute paraspinal findings. Bilateral pleural effusions and bilateral pulmonary opacities, further described on chest CT. Disc levels: No large disc herniation, thoracic spinal stenosis or significant foraminal compromise. CT LUMBAR SPINE FINDINGS Segmentation: There are 5 lumbar type vertebral bodies. Alignment: Normal. Vertebrae: Patient has undergone spinal augmentation at L1 (on 02/23/2020). Underlying fracture results in approximately 50% loss of vertebral body height. There is no associated osseous retropulsion. No evidence of acute lumbar spine fracture. Paraspinal and  other soft tissues: No acute paraspinal findings. Diffuse aortic and branch vessel atherosclerosis. Multifocal aneurysmal dilatation of the abdominal aorta with multiple saccular components and short segment dissections. The aorta measures up to 4.4 cm AP on image 78/4. This compares with 4.0 cm previously. No evidence of retroperitoneal hematoma. The bladder is mildly distended. Disc levels: Mild superimposed multilevel spondylosis with disc space narrowing and endplate osteophytes greatest at L5-S1. Mild facet degenerative changes. No high-grade spinal stenosis. IMPRESSION: CT THORACIC SPINE IMPRESSION 1. Diffuse ankylosis of the thoracic spine. 2. Nondisplaced nearly horizontal fracture through the T8 vertebral body with potential posterior extension into the posterior elements at T9. Given the patient's underlying rigid spine, this may reflect an unstable fracture. Consider further evaluation with thoracic MRI. 3. No evidence of thoracic paraspinal hematoma. CT LUMBAR SPINE IMPRESSION 1. No acute lumbar spine findings. 2. Recent spinal augmentation at L1. 3. Extensive aortic atherosclerosis with multifocal aneurysmal dilatation and short segment dissection, mildly progressed from CTA 10 months ago. Consider follow-up CTA as clinically warranted. 4. These results were called by telephone at the time of interpretation on 08/17/2020 at 11:16 am to provider [facility] , who verbally acknowledged these results. Electronically Signed   By: Richardean Sale M.D.   On: 08/17/2020 11:17   MR THORACIC SPINE WO CONTRAST  Result Date: 08/17/2020 CLINICAL DATA:  Thoracic spine fracture noted on CT EXAM: MRI THORACIC SPINE WITHOUT CONTRAST TECHNIQUE: Multiplanar, multisequence MR imaging of the thoracic spine was performed. No intravenous contrast was administered. COMPARISON:  CT thoracic spine obtained earlier the same day FINDINGS: Alignment: There is trace anterolisthesis of T1 on T2. Alignment is otherwise normal.  There is no evidence of traumatic malalignment Vertebrae: There is linear T1 hypointensity extending posteriorly from the anterior endplate of the T8 vertebral body. The fracture plane extends superiorly to involve the superior endplate. There is mild associated STIR hyperintensity. There is no loss of vertebral body height. There is no bony retropulsion. There is no epidural hematoma. There is no definite involvement of the posterior elements. There is no evidence of disruption of the anterior or posterior longitudinal ligaments or the ligamentum flavum. Post kyphoplasty changes at L1 are incompletely imaged. The remaining vertebral body heights in the thoracic spine are preserved. Marrow signal at the remaining levels is normal. Cord:  Normal signal and morphology. Paraspinal and other soft tissues: There is mild edema in the prevertebral soft tissues anterior to the above described fracture. Bilateral pleural effusions are noted. There is a right renal cyst, incompletely evaluated. Disc levels: There is a diffuse disc bulge, ligamentum flavum thickening, prominent dorsal epidural fat, and mild bilateral facet arthropathy at T11-T12 results in moderate spinal canal stenosis without evidence of cord  compression. The spinal canal at the remaining levels is patent. There is no high-grade neural foraminal stenosis. IMPRESSION: 1. Horizontally oriented fracture extending from the anterior T8 endplate without evidence of posterior element involvement. No loss of vertebral body height or bony retropulsion into the canal, and no evidence of epidural hematoma. No evidence of ligamentous injury. 2. No other fracture identified. 3. Moderate spinal canal stenosis at T11-T12 without evidence of cord compression. Electronically Signed   By: Valetta Mole M.D.   On: 08/17/2020 14:44   CT T-SPINE NO CHARGE  Result Date: 08/17/2020 CLINICAL DATA:  Fall yesterday. Back pain and shortness of breath. On blood thinners. EXAM: CT  THORACIC AND LUMBAR SPINE WITHOUT CONTRAST TECHNIQUE: Multidetector CT imaging of the thoracic and lumbar spine was performed without contrast. Multiplanar CT image reconstructions were also generated. COMPARISON:  Chest CT same date. Lumbar MRI 01/08/2020. CTA of the chest and abdomen 10/13/2019. FINDINGS: CT THORACIC SPINE FINDINGS Alignment: Normal. Vertebrae: The bones are demineralized. There is diffuse ankylosis of the thoracic spine. There is high suspicion of a nondisplaced nearly horizontal fracture through the T8 vertebral body, best seen on the reformatted images. No clear extension to the posterior elements identified, although there is questionable involvement of the posterior elements bilaterally at T9. No displaced fractures or traumatic subluxation identified. Paraspinal and other soft tissues: No acute paraspinal findings. Bilateral pleural effusions and bilateral pulmonary opacities, further described on chest CT. Disc levels: No large disc herniation, thoracic spinal stenosis or significant foraminal compromise. CT LUMBAR SPINE FINDINGS Segmentation: There are 5 lumbar type vertebral bodies. Alignment: Normal. Vertebrae: Patient has undergone spinal augmentation at L1 (on 02/23/2020). Underlying fracture results in approximately 50% loss of vertebral body height. There is no associated osseous retropulsion. No evidence of acute lumbar spine fracture. Paraspinal and other soft tissues: No acute paraspinal findings. Diffuse aortic and branch vessel atherosclerosis. Multifocal aneurysmal dilatation of the abdominal aorta with multiple saccular components and short segment dissections. The aorta measures up to 4.4 cm AP on image 78/4. This compares with 4.0 cm previously. No evidence of retroperitoneal hematoma. The bladder is mildly distended. Disc levels: Mild superimposed multilevel spondylosis with disc space narrowing and endplate osteophytes greatest at L5-S1. Mild facet degenerative changes. No  high-grade spinal stenosis. IMPRESSION: CT THORACIC SPINE IMPRESSION 1. Diffuse ankylosis of the thoracic spine. 2. Nondisplaced nearly horizontal fracture through the T8 vertebral body with potential posterior extension into the posterior elements at T9. Given the patient's underlying rigid spine, this may reflect an unstable fracture. Consider further evaluation with thoracic MRI. 3. No evidence of thoracic paraspinal hematoma. CT LUMBAR SPINE IMPRESSION 1. No acute lumbar spine findings. 2. Recent spinal augmentation at L1. 3. Extensive aortic atherosclerosis with multifocal aneurysmal dilatation and short segment dissection, mildly progressed from CTA 10 months ago. Consider follow-up CTA as clinically warranted. 4. These results were called by telephone at the time of interpretation on 08/17/2020 at 11:16 am to provider [facility] , who verbally acknowledged these results. Electronically Signed   By: Richardean Sale M.D.   On: 08/17/2020 11:17   DG Chest Portable 1 View  Result Date: 08/17/2020 CLINICAL DATA:  Near syncope, fall, initial encounter. EXAM: PORTABLE CHEST 1 VIEW COMPARISON:  11/10/2019 and CT chest 10/13/2019. FINDINGS: Trachea is midline. Heart size stable. Thoracic aorta is calcified. Mixed interstitial and airspace opacification bilaterally. Possible nodular consolidation in the apical left upper lobe. No definite pleural fluid. IMPRESSION: 1. Pulmonary edema. 2. Possible nodular consolidation in the  left upper lobe. Followup PA and lateral chest X-ray is recommended in 3-4 weeks following trial of antibiotic therapy to ensure resolution and exclude underlying malignancy, as clinically warranted. Electronically Signed   By: Lorin Picket M.D.   On: 08/17/2020 10:01        Scheduled Meds:  amiodarone  200 mg Oral Daily   amLODipine  10 mg Oral Daily   azithromycin  250 mg Oral Daily   cholecalciferol  1,000 Units Oral Daily   docusate sodium  100 mg Oral BID   ezetimibe  10  mg Oral Daily   furosemide  40 mg Oral BID   gabapentin  200 mg Oral TID   galantamine  8 mg Oral Q breakfast   hydrALAZINE  25 mg Oral BID   isosorbide mononitrate  60 mg Oral QPC supper   leflunomide  10 mg Oral Daily   memantine  10 mg Oral BID   omega-3 acid ethyl esters  1 g Oral Daily   pantoprazole  40 mg Oral QODAY   potassium chloride  40 mEq Oral BID   predniSONE  9 mg Oral Q breakfast   psyllium  1 packet Oral Daily   Rivaroxaban  15 mg Oral Daily   sodium chloride flush  3 mL Intravenous Q12H   vitamin B-12  1,000 mcg Oral Daily   Continuous Infusions:  sodium chloride       LOS: 0 days    Time spent: 32 minutes    Barb Merino, MD [facility] Pager [phone]

## 2020-08-19 DIAGNOSIS — I480 Paroxysmal atrial fibrillation: Secondary | ICD-10-CM | POA: Diagnosis not present

## 2020-08-19 DIAGNOSIS — E663 Overweight: Secondary | ICD-10-CM | POA: Diagnosis present

## 2020-08-19 DIAGNOSIS — N1831 Chronic kidney disease, stage 3a: Secondary | ICD-10-CM | POA: Diagnosis not present

## 2020-08-19 DIAGNOSIS — I5033 Acute on chronic diastolic (congestive) heart failure: Secondary | ICD-10-CM

## 2020-08-19 DIAGNOSIS — S22069A Unspecified fracture of T7-T8 vertebra, initial encounter for closed fracture: Secondary | ICD-10-CM

## 2020-08-19 MED ORDER — POLYETHYLENE GLYCOL 3350 17 G PO PACK
17.0000 g | PACK | Freq: Every day | ORAL | Status: DC
  Administered 2020-08-19 – 2020-08-23 (×4): 17 g via ORAL
  Filled 2020-08-19 (×5): qty 1

## 2020-08-19 MED ORDER — PANTOPRAZOLE SODIUM 40 MG PO TBEC
40.0000 mg | DELAYED_RELEASE_TABLET | Freq: Every day | ORAL | Status: DC
  Administered 2020-08-20 – 2020-08-23 (×4): 40 mg via ORAL
  Filled 2020-08-19: qty 2
  Filled 2020-08-19 (×3): qty 1

## 2020-08-19 MED ORDER — FUROSEMIDE 10 MG/ML IJ SOLN
20.0000 mg | Freq: Once | INTRAMUSCULAR | Status: DC
  Filled 2020-08-19: qty 2

## 2020-08-19 NOTE — Progress Notes (Signed)
IR was requested for image guided kyphoplasty of T8.   Case was reviewed by Dr. Estanislado Pandy, who states that the "fracture" shown on MRI could be an artifact not a true fracture.   Patient was evaluates at the bedside.  Pt sitting in bed, NAD, wife at bedside.   Pt and wife reports that pt has been weak for a while, he was just recovering from L1 fx and then he lost balance and fell 2 days ago.  Most pain is localized on left lower lateral ribcage.  Patient denies upper back pain.   Physical exam revealed no TTP on spine from neck to lower back.   Discussed with the patient and his wife, it does not seem like kyphoplasty is indicated at this time.  All questions were answered, patient and wife both verbalized understanding why the kyphoplasty is not indicated.    Ordering provider notified.   Will delete the Ir Rad eval order.  Please call IR for questions and concerns.   [name] Gang Alexandros Ewan PA-C 08/19/2020 1:57 PM

## 2020-08-19 NOTE — Progress Notes (Addendum)
PROGRESS NOTE  [name] [name] GDJ:[id number] DOB: 1926-07-16 DOA: 08/17/2020 PCP: [name], MD  HPI/Recap of past 85 hours: 87 year old gentleman with past medical history of paroxysmal A. fib on Xarelto, CAD, severe aortic stenosis status post valve replacement and stage IIIa chronic kidney disease and polymyalgia rheumatica on chronic steroids who lost balance and had a mechanical fall landing on his back.  Chest x-ray noted some pulmonary edema and CT scan of spine noted nondisplaced T8 vertebral fracture with posterior extension into T9 confirmed by MRI.  CT scan also noted multifocal opacities.  Patient back pain somewhat better.  His major complaint is of rib pain, left greater than right, worse with deep inspiration.  Seen by interventional radiology who felt that compression fracture did not warrant kyphoplasty at this time.  Patient seen by physical therapy who are recommending skilled nursing  Assessment/Plan: Active Problems:   CAD (coronary artery disease), native coronary artery: Stable.    Hyperlipidemia: Continue statin.    Hypertension: Blood pressure stable.    Paroxysmal atrial fibrillation ([place]): Stable.  Rate controlled on amiodarone.  Continue Xarelto.    Thoracic ascending aortic aneurysm ([place]): Stable.  Continue monitoring.   OSA (obstructive sleep apnea): Continue home CPAP.    Acute on chronic diastolic heart failure ([place]): Minimal elevation of BNP.  On home dose of Lasix, so we will give an extra dose of IV Lasix and recheck BMP in the morning.  Given normal procalcitonin, no evidence of pneumonia, so we will discontinue antibiotics.    T8 vertebral fracture ([place]): Slight horizontal fracture.  Interventional radiology feels it may be even artifact.  No reason for kyphoplasty at this time.  Continue TLSO brace.  Continue pain control.    Thrombocytopenia ([place]): Stable.    CKD (chronic kidney disease) stage 3a GFR 30-59 ml/min ([place]): Stable.    Elevated  troponin: Likely in the setting of mild acute heart failure.  No evidence of ACS    Overweight (BMI 25.0-29.9): Meets criteria BMI greater than 25.  Polymyalgia rheumatica: On chronic steroids.  This is the cause of his frequent injuries and fractures.  Code Status: DNR  Family Communication: Wife at the bedside  Disposition Plan: Skilled nursing, likely Monday.  Patient would prefer [facility] skilled nursing.  Also, need to communicate to social work that he does have long-term care insurance   Consultants: Interventional radiology  Procedures: None  Antimicrobials: Zithromax 8/18-8/19  DVT prophylaxis: Xarelto  Level of care: Telemetry Cardiac   Objective: Vitals:   08/19/20 0735 08/19/20 1428  BP: 123/80 (!) 141/73  Pulse: 87 72  Resp: 16 16  Temp: 97.7 F (36.5 C) 97.6 F (36.4 C)  SpO2: 96% 99%    Intake/Output Summary (Last 24 hours) at 08/19/2020 1706 Last data filed at 08/19/2020 1300 Gross per 24 hour  Intake 600 ml  Output 1800 ml  Net -1200 ml   Filed Weights   08/17/20 0924 08/19/20 0500  Weight: 100.7 kg 100.7 kg   Body mass index is 29.29 kg/m.  Exam:  General: Alert and oriented x3, no acute distress HEENT: Normocephalic, atraumatic, mucous membranes are moist Cardiovascular: Irregular rhythm, rate controlled Respiratory: Clear to auscultation bilaterally Abdomen: Soft, nontender, nondistended, positive bowel sounds Musculoskeletal: No clubbing or cyanosis or edema Skin: No skin breaks, tears or lesions Psychiatry: Appropriate, no evidence of psychoses Neurology: No focal deficits   Data Reviewed: CBC: Recent Labs  Lab 08/17/20 0912 08/17/20 1323 08/17/20 1932  WBC 5.5 5.2  6.2  NEUTROABS 3.7 3.7 4.8  HGB 9.0* 9.4* 8.6*  HCT 28.4* 30.2* 28.1*  MCV 98.3 99.0 100.7*  PLT 85* 89* 83*   Basic Metabolic Panel: Recent Labs  Lab 08/17/20 0912 08/17/20 1323 08/18/20 0325  NA 140  --  137  K 3.6  --  3.2*  CL 106  --  104   CO2 27  --  24  GLUCOSE 110*  --  119*  BUN 20  --  17  CREATININE 1.40*  --  1.37*  CALCIUM 8.7*  --  8.5*  MG  --  2.6* 2.4   GFR: Estimated Creatinine Clearance: 42.9 mL/min (A) (by C-G formula based on SCr of 1.37 mg/dL (H)). Liver Function Tests: No results for input(s): AST, ALT, ALKPHOS, BILITOT, PROT, ALBUMIN in the last 168 hours. No results for input(s): LIPASE, AMYLASE in the last 168 hours. No results for input(s): AMMONIA in the last 168 hours. Coagulation Profile: No results for input(s): INR, PROTIME in the last 168 hours. Cardiac Enzymes: Recent Labs  Lab 08/17/20 0912  CKTOTAL 38*   BNP (last 3 results) No results for input(s): PROBNP in the last 8760 hours. HbA1C: No results for input(s): HGBA1C in the last 72 hours. CBG: No results for input(s): GLUCAP in the last 168 hours. Lipid Profile: No results for input(s): CHOL, HDL, LDLCALC, TRIG, CHOLHDL, LDLDIRECT in the last 72 hours. Thyroid Function Tests: No results for input(s): TSH, T4TOTAL, FREET4, T3FREE, THYROIDAB in the last 72 hours. Anemia Panel: No results for input(s): VITAMINB12, FOLATE, FERRITIN, TIBC, IRON, RETICCTPCT in the last 72 hours. Urine analysis:    Component Value Date/Time   COLORURINE YELLOW 08/17/2020 2219   APPEARANCEUR CLEAR 08/17/2020 2219   LABSPEC 1.012 08/17/2020 2219   PHURINE 7.0 08/17/2020 2219   GLUCOSEU NEGATIVE 08/17/2020 2219   HGBUR NEGATIVE 08/17/2020 2219   BILIRUBINUR NEGATIVE 08/17/2020 2219   KETONESUR NEGATIVE 08/17/2020 2219   PROTEINUR NEGATIVE 08/17/2020 2219   UROBILINOGEN 1.0 03/22/2012 0845   NITRITE NEGATIVE 08/17/2020 2219   LEUKOCYTESUR NEGATIVE 08/17/2020 2219   Sepsis Labs: @LABRCNTIP (procalcitonin:4,lacticidven:4)  ) Recent Results (from the past 240 hour(s))  Resp Panel by RT-PCR (Flu A&B, Covid) Nasopharyngeal Swab     Status: None   Collection Time: 08/17/20 12:10 PM   Specimen: Nasopharyngeal Swab; Nasopharyngeal(NP) swabs in vial  transport medium  Result Value Ref Range Status   SARS Coronavirus 2 by RT PCR NEGATIVE NEGATIVE Final    Comment: (NOTE) SARS-CoV-2 target nucleic acids are NOT DETECTED.  The SARS-CoV-2 RNA is generally detectable in upper respiratory specimens during the acute phase of infection. The lowest concentration of SARS-CoV-2 viral copies this assay can detect is 138 copies/mL. A negative result does not preclude SARS-Cov-2 infection and should not be used as the sole basis for treatment or other patient management decisions. A negative result may occur with  improper specimen collection/handling, submission of specimen other than nasopharyngeal swab, presence of viral mutation(s) within the areas targeted by this assay, and inadequate number of viral copies(<138 copies/mL). A negative result must be combined with clinical observations, patient history, and epidemiological information. The expected result is Negative.  Fact Sheet for Patients:  EntrepreneurPulse.com.au  Fact Sheet for Healthcare Providers:  IncredibleEmployment.be  This test is no t yet approved or cleared by the [place] FDA and  has been authorized for detection and/or diagnosis of SARS-CoV-2 by FDA under an Emergency Use Authorization (EUA). This EUA will remain  in effect (meaning  this test can be used) for the duration of the COVID-19 declaration under Section 564(b)(1) of the Act, 21 U.S.C.section 360bbb-3(b)(1), unless the authorization is terminated  or revoked sooner.       Influenza A by PCR NEGATIVE NEGATIVE Final   Influenza B by PCR NEGATIVE NEGATIVE Final    Comment: (NOTE) The Xpert Xpress SARS-CoV-2/FLU/RSV plus assay is intended as an aid in the diagnosis of influenza from Nasopharyngeal swab specimens and should not be used as a sole basis for treatment. Nasal washings and aspirates are unacceptable for Xpert Xpress SARS-CoV-2/FLU/RSV testing.  Fact  Sheet for Patients: EntrepreneurPulse.com.au  Fact Sheet for Healthcare Providers: IncredibleEmployment.be  This test is not yet approved or cleared by the [place] FDA and has been authorized for detection and/or diagnosis of SARS-CoV-2 by FDA under an Emergency Use Authorization (EUA). This EUA will remain in effect (meaning this test can be used) for the duration of the COVID-19 declaration under Section 564(b)(1) of the Act, 21 U.S.C. section 360bbb-3(b)(1), unless the authorization is terminated or revoked.  Performed at [facility], [place] [address] [place], [place] [address]   Culture, blood (routine x 2)     Status: None (Preliminary result)   Collection Time: 08/17/20  2:50 PM   Specimen: BLOOD LEFT FOREARM  Result Value Ref Range Status   Specimen Description BLOOD LEFT FOREARM  Final   Special Requests   Final    BOTTLES DRAWN AEROBIC AND ANAEROBIC Blood Culture adequate volume   Culture   Final    NO GROWTH 2 DAYS Performed at [facility], [place] [address], [place], [place] [address]    Report Status PENDING  Incomplete  Culture, blood (routine x 2)     Status: None (Preliminary result)   Collection Time: 08/17/20  2:58 PM   Specimen: BLOOD  Result Value Ref Range Status   Specimen Description BLOOD LEFT ANTECUBITAL  Final   Special Requests   Final    BOTTLES DRAWN AEROBIC AND ANAEROBIC Blood Culture adequate volume   Culture   Final    NO GROWTH 2 DAYS Performed at [facility], [place] [address] [place], [place] [address]    Report Status PENDING  Incomplete      Studies: No results found.  Scheduled Meds:  amiodarone  200 mg Oral Daily   amLODipine  10 mg Oral Daily   azithromycin  250 mg Oral Daily   cholecalciferol  1,000 Units Oral Daily   docusate sodium  100 mg Oral BID   ezetimibe  10 mg Oral Daily   furosemide  40 mg Oral BID   gabapentin  200 mg Oral TID   galantamine  8 mg Oral Q  breakfast   hydrALAZINE  25 mg Oral BID   isosorbide mononitrate  60 mg Oral QPC supper   leflunomide  10 mg Oral Daily   memantine  10 mg Oral BID   omega-3 acid ethyl esters  1 g Oral Daily   [START ON 08/20/2020] pantoprazole  40 mg Oral Daily   polyethylene glycol  17 g Oral Daily   potassium chloride  40 mEq Oral BID   predniSONE  9 mg Oral Q breakfast   psyllium  1 packet Oral Daily   Rivaroxaban  15 mg Oral Daily   sodium chloride flush  3 mL Intravenous Q12H   vitamin B-12  1,000 mcg Oral Daily    Continuous Infusions:  sodium chloride  LOS: 1 day     Annita Brod, MD [facility]   08/19/2020, 5:06 PM

## 2020-08-19 NOTE — TOC Initial Note (Signed)
Transition of Care [facility]) - Initial/Assessment Note    Patient Details  Name: [name] [name] MRN: [id number] Date of Birth: 12/02/1926  Transition of [facility]) CM/SW Contact:    Milinda Antis, [place] Phone Number: 08/19/2020, 10:46 AM  Clinical Narrative:                 CSW received consult for possible SNF placement at time of discharge. CSW spoke with patient. Patient reported that patient's spouse is currently unable to care for patient at their home given patient's current physical needs. Patient expressed understanding of PT recommendation and is agreeable to SNF placement at time of discharge. Patient reports preference for a facility in [place]. CSW discussed insurance authorization process and provided Medicare SNF ratings list. Patient has received the COVID vaccines.    Skilled Nursing Rehab Facilities-   [email or web address] *Ratings updated quarterly   Ratings out of 5 possible   Name Address  Phone # [place] Inspection Overall  [facility] [address], [place] 5  4 4   [facility]  [address] [place], [place] [phone] 4 2 4 4   [facility] [place], [place] 2 3 1 1   [place], [place] 3 3 4 4   [facility] [address], [place] 3  2 2   [place] [address], [place] [phone] 2 2 4 4   [facility] [address], [place] 5 2 2 3   [facility] [address], [place] 4 2 2 2   [place] [facility] [place] 5 2 2 3   [facility] [phone], [place] [phone] 5 1 2 2   [facility] [address], [facility] [phone] 5 2 2 3   [place] [address] [place] 3 1 1 1      Expected Discharge Plan: Skilled Nursing  Facility Barriers to Discharge: Insurance Authorization, SNF Pending bed offer   Patient Goals and CMS Choice Patient states their goals for this hospitalization and ongoing recovery are:: being able to go to his grand daughter's wedding CMS Medicare.gov Compare Post Acute Care list provided to:: Patient Choice offered to / list presented to : Patient  Expected Discharge Plan and Services Expected Discharge Plan: [place] arrangements for the past 2 months: Single Family Home                                      Prior Living Arrangements/Services Living arrangements for the past 2 months: Single Family Home Lives with:: Spouse Patient language and need for interpreter reviewed:: Yes Do you feel safe going back to the place where you live?: Yes      Need for Family Participation in Patient Care: Yes (Comment) Care giver support system in place?: Yes (comment)   Criminal Activity/Legal Involvement Pertinent to Current Situation/Hospitalization: No - Comment as needed  Activities of Daily Living Home Assistive Devices/Equipment: CPAP, Grab bars around toilet, Walker (specify type) ADL Screening (condition at time of admission) Patient's cognitive ability adequate to safely complete daily activities?: Yes Is the patient deaf or have difficulty hearing?: No Does the patient have difficulty seeing, even when wearing glasses/contacts?: No Does the patient have difficulty concentrating, remembering, or making decisions?: No Patient able  to express need for assistance with ADLs?: Yes Does the patient have difficulty dressing or bathing?: Yes Independently performs ADLs?: No Communication: Independent Dressing (OT): Needs assistance Is this a change from baseline?: Pre-admission baseline Grooming: Needs assistance Is this a change from baseline?: Pre-admission baseline Feeding: Independent Bathing: Needs assistance Is this a change from  baseline?: Pre-admission baseline Toileting: Needs assistance Is this a change from baseline?: Pre-admission baseline In/Out Bed: Needs assistance Is this a change from baseline?: Pre-admission baseline Walks in Home: Needs assistance Is this a change from baseline?: Pre-admission baseline Does the patient have difficulty walking or climbing stairs?: Yes Weakness of Legs: Both Weakness of Arms/Hands: None  Permission Sought/Granted   Permission granted to share information with : Yes, Verbal Permission Granted     Permission granted to share info w AGENCY: SNF        Emotional Assessment Appearance:: Appears older than stated age Attitude/Demeanor/Rapport: Engaged Affect (typically observed): Pleasant, Appropriate Orientation: : Oriented to Self, Oriented to Place, Oriented to  Time, Oriented to Situation Alcohol / Substance Use: Not Applicable Psych Involvement: No (comment)  Admission diagnosis:  Trauma [T14.90XA] Vertebral fracture, osteoporotic ([place]) [M80.08XA] Multifocal pneumonia [J18.9] Patient Active Problem List   Diagnosis Date Noted   Vertebral fracture, osteoporotic ([place]) 08/18/2020   Multifocal pneumonia 08/17/2020   T8 vertebral fracture ([place]) 08/17/2020   Thrombocytopenia ([place]) 08/17/2020   CKD (chronic kidney disease) stage 3, GFR 30-59 ml/min ([place]) 08/17/2020   Elevated troponin 08/17/2020   Pain due to onychomycosis of toenails of both feet 07/27/2020   Callus of heel 07/27/2020   Bifascicular block 11/11/2019   Acute on chronic diastolic heart failure ([place]) 11/10/2019   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   Severe aortic stenosis 09/29/2019   OSA (obstructive sleep apnea) 11/18/2014   Thoracic ascending aortic aneurysm ([place]) 09/29/2012   S/P laparoscopic cholecystectomy 04/09/2012   S/P CABG x 3 03/06/2012   S/P Maze operation for atrial fibrillation 03/06/2012   Paroxysmal atrial fibrillation ([place]) 02/29/2012   CAD  (coronary artery disease), native coronary artery    Hyperlipidemia    Obesity (BMI 30-39.9)    Hypertension    Chronic venous insufficiency    Gout    Benign prostatic hyperplasia    PCP:  [name], MD Pharmacy:   [facility] [place], [place] [address] Phone: [phone] Fax: [phone]     Social Determinants of Health (SDOH) Interventions    Readmission Risk Interventions No flowsheet data found.

## 2020-08-19 NOTE — Progress Notes (Signed)
Physical Therapy Treatment Patient Details Name: [name] [name] MRN: [id number] DOB: [birth year] Today's Date: 08/19/2020    History of Present Illness Pt is a 87 y/o male admitted 08/17/20 following a fall. Found to have T8 fx to be managed conservatively.  IR consulted and do not believe MRI to be conclusive for T8 fx, no kyphopasty needed at this time.  Continue to tx conservatively with bracing. PMH includes CAD s/p CABG, dCHF, HTN, a fib, CKD, s/p TAVR.    PT Comments    Came in during last portion of OT session as OT felt safer progressing pre gait and gait with a second set of skilled hands.  Pt was able to march in place, but did not feel ready to progress gait away from the bed due to bil LE weakness.  He reports 30 lb weight loss recently ("muscle weight") and bil LE weakness.  He remains appropriate for SNF level rehab at discharge.  PT will continue to follow acutely for safe mobility progression.    Follow Up Recommendations  SNF     Equipment Recommendations  Hospital bed;Wheelchair cushion (measurements PT);Wheelchair (measurements PT);3in1 (PT);Rolling walker with 5" wheels    Recommendations for Other Services       Precautions / Restrictions Precautions Precautions: Back Precaution Booklet Issued: No Precaution Comments: reinforced back precautions Required Braces or Orthoses: Spinal Brace Spinal Brace: Thoracolumbosacral orthotic (no orders for where to donn, but done EOB.)    Mobility  Bed Mobility Overal bed mobility: Needs Assistance Bed Mobility: Rolling;Sidelying to Sit;Sit to Sidelying Rolling: Min assist Sidelying to sit: Min assist     Sit to sidelying: Mod assist General bed mobility comments: Sit to sidlying with cues for reverse log roll assist at trunk lightly and heavier assit at bil LEs to lift back into bed, pt reports wife normally helps him lift legs into bed.    Transfers Overall transfer level: Needs assistance Equipment used: Rolling  walker (2 wheeled) Transfers: Sit to/from [facility] Sit to Stand: Min assist;+2 safety/equipment;+2 physical assistance         General transfer comment: verbal cues for hand placement and assist to boost into standing and to steady  Ambulation/Gait                 Stairs             Wheelchair Mobility    Modified Rankin (Stroke Patients Only)       Balance Overall balance assessment: Needs assistance Sitting-balance support: Feet supported;Single extremity supported Sitting balance-Leahy Scale: Poor Sitting balance - Comments: requires UE support   Standing balance support: Bilateral upper extremity supported Standing balance-Leahy Scale: Poor Standing balance comment: requires UE support, stood for > 5 mins working on tolerance of standing and pre gait (marching in place) pt did not feel ready for OOB to chair or short distance gait.                            Cognition Arousal/Alertness: Awake/alert Behavior During Therapy: WFL for tasks assessed/performed Overall Cognitive Status: No family/caregiver present to determine baseline cognitive functioning                                 General Comments: per chart, pt has h/o memory loss, however, WNL conversation during our session.      Exercises  General Comments General comments (skin integrity, edema, etc.): Sp02 94% on 2L supplmental 02.  94-86% on RA.  Pt placed back on 2L      Pertinent Vitals/Pain Pain Assessment: Faces Faces Pain Scale: Hurts little more Pain Location: back and ribs Pain Descriptors / Indicators: Grimacing Pain Intervention(s): Limited activity within patient's tolerance;Monitored during session;Repositioned    Home Living Family/patient expects to be discharged to:: Skilled nursing facility Living Arrangements: Spouse/significant other Available Help at Discharge: Family;Available 24 hours/day Type of Home: House Home  Access: Stairs to enter Entrance Stairs-Rails: Left;Right;Can reach both Home Layout: Two level;Able to live on main level with bedroom/bathroom Home Equipment: Gilford Rile - 2 wheels;Walker - 4 wheels;Grab bars - toilet;Grab bars - tub/shower;Shower seat      Prior Function Level of Independence: Needs assistance  Gait / Transfers Assistance Needed: Needs assist to get into/out of bed. Normally uses rollator ADL's / Homemaking Assistance Needed: Wife assists as needed     PT Goals (current goals can now be found in the care plan section) Acute Rehab PT Goals Patient Stated Goal: to be able to take care of self again Progress towards PT goals: Progressing toward goals    Frequency    Min 2X/week      PT Plan Current plan remains appropriate    Co-evaluation PT/OT/SLP Co-Evaluation/Treatment: Yes Reason for Co-Treatment: For patient/therapist safety PT goals addressed during session: Proper use of DME;Balance;Mobility/safety with mobility OT goals addressed during session: ADL's and self-care      AM-PAC PT "6 Clicks" Mobility   Outcome Measure  Help needed turning from your back to your side while in a flat bed without using bedrails?: A Little Help needed moving from lying on your back to sitting on the side of a flat bed without using bedrails?: A Little Help needed moving to and from a bed to a chair (including a wheelchair)?: A Little Help needed standing up from a chair using your arms (e.g., wheelchair or bedside chair)?: A Little Help needed to walk in hospital room?: A Lot Help needed climbing 3-5 steps with a railing? : Total 6 Click Score: 15    End of Session Equipment Utilized During Treatment: Back brace Activity Tolerance: Patient limited by pain Patient left: in bed;with call bell/phone within reach;with bed alarm set   PT Visit Diagnosis: Other abnormalities of gait and mobility (R26.89);History of falling (Z91.81);Difficulty in walking, not elsewhere  classified (R26.2);Pain Pain - Right/Left:  (back) Pain - part of body:  (back)     Time: 4497-5300 PT Time Calculation (min) (ACUTE ONLY): 17 min  Charges:  $Therapeutic Activity: 8-22 mins                     Verdene Lennert, PT, DPT  Acute Rehabilitation Ortho Tech Supervisor [phone] pager [phone]) [phone] office

## 2020-08-19 NOTE — Evaluation (Signed)
Occupational Therapy Evaluation Patient Details Name: [name] [name] MRN: [id number] DOB: [birth year] Today's Date: 08/19/2020    History of Present Illness Pt is a 87 y/o male admitted following fall. Found to have T8 fx to be managed conservatively. PMH includes CAD s/p CABG, dCHF, HTN, a fib, CKD, s/p TAVR.   Clinical Impression   Pt admitted with above. He demonstrates the below listed deficits and will benefit from continued OT to maximize safety and independence with BADLs.  Pt presents to OT with generalized weakness, impaired balance, decreased activity tolerance, pain.  He requires min - mod A for UB ADLs and max - total A for LB ADLs.  He was able to move to EOB with min A today, and stood with min A +2.  He lives with his wife and has h/o falls. He was ambulating with rollator and required intermittent assist for ADLs.  Recommend SNF for rehab.      Follow Up Recommendations  SNF    Equipment Recommendations  None recommended by OT    Recommendations for Other Services       Precautions / Restrictions Precautions Precautions: Back Precaution Booklet Issued: No Precaution Comments: reinforced back precautions Required Braces or Orthoses: Spinal Brace Spinal Brace: Thoracolumbosacral orthotic      Mobility Bed Mobility Overal bed mobility: Needs Assistance Bed Mobility: Rolling;Sidelying to Sit;Sit to Sidelying Rolling: Min assist Sidelying to sit: Min assist     Sit to sidelying: Mod assist General bed mobility comments: assist to roll and lift trunk, and assist to lift LEs onto the bed    Transfers Overall transfer level: Needs assistance Equipment used: Rolling walker (2 wheeled) Transfers: Sit to/from [employer] Transfers Sit to Stand: Min assist;+2 safety/equipment;+2 physical assistance         General transfer comment: verbal cues for hand placement and assist to boost into standing and to steady    Balance Overall balance assessment: Needs  assistance Sitting-balance support: Feet supported;Single extremity supported Sitting balance-Leahy Scale: Poor Sitting balance - Comments: requires UE support   Standing balance support: Bilateral upper extremity supported Standing balance-Leahy Scale: Poor Standing balance comment: requires UE support                           ADL either performed or assessed with clinical judgement   ADL Overall ADL's : Needs assistance/impaired Eating/Feeding: Set up;Bed level   Grooming: Wash/dry hands;Wash/dry face;Brushing hair;Minimal assistance;Sitting   Upper Body Bathing: Moderate assistance;Bed level   Lower Body Bathing: Maximal assistance;Bed level   Upper Body Dressing : Moderate assistance;Sitting   Lower Body Dressing: Sit to/from stand;Total assistance   Toilet Transfer: Minimal assistance;+2 for physical assistance;+2 for safety/equipment;Stand-pivot;BSC;RW   Toileting- Clothing Manipulation and Hygiene: Total assistance;Sit to/from stand       Functional mobility during ADLs: Minimal assistance;+2 for physical assistance;+2 for safety/equipment;Rolling walker       Vision         Perception     Praxis      Pertinent Vitals/Pain Pain Assessment: Faces Faces Pain Scale: Hurts little more Pain Location: back and ribs Pain Descriptors / Indicators: Grimacing Pain Intervention(s): Monitored during session     Hand Dominance     Extremity/Trunk Assessment Upper Extremity Assessment Upper Extremity Assessment: Generalized weakness   Lower Extremity Assessment Lower Extremity Assessment: Defer to PT evaluation   Cervical / Trunk Assessment Cervical / Trunk Assessment: Other exceptions Cervical / Trunk Exceptions: T8 fx  in TLSO throughout   Communication Communication Communication: No difficulties   Cognition Arousal/Alertness: Awake/alert Behavior During Therapy: WFL for tasks assessed/performed Overall Cognitive Status: No family/caregiver  present to determine baseline cognitive functioning                                 General Comments: per chart, pt has h/o memory loss   General Comments  Sp02 94% on 2L supplmental 02.  94-86% on RA.  Pt placed back on 2L    Exercises     Shoulder Instructions      Home Living Family/patient expects to be discharged to:: Skilled nursing facility Living Arrangements: Spouse/significant other Available Help at Discharge: Family;Available 24 hours/day Type of Home: House Home Access: Stairs to enter [employer] of Steps: 5 Entrance Stairs-Rails: Left;Right;Can reach both Home Layout: Two level;Able to live on main level with bedroom/bathroom     Bathroom Shower/Tub: Walk-in shower   Bathroom Toilet: Handicapped height     Home Equipment: Environmental consultant - 2 wheels;Walker - 4 wheels;Grab bars - toilet;Grab bars - tub/shower;Shower seat          Prior Functioning/Environment Level of Independence: Needs assistance  Gait / Transfers Assistance Needed: Needs assist to get into/out of bed. Normally uses rollator ADL's / Homemaking Assistance Needed: Wife assists as needed            OT Problem List: Decreased strength;Decreased activity tolerance;Impaired balance (sitting and/or standing);Decreased knowledge of use of DME or AE;Decreased knowledge of precautions;Pain      OT Treatment/Interventions: Self-care/ADL training;DME and/or AE instruction;Therapeutic activities;Patient/family education;Balance training    OT Goals(Current goals can be found in the care plan section) Acute Rehab OT Goals Patient Stated Goal: to be able to take care of self again OT Goal Formulation: With patient Time For Goal Achievement: 09/02/20 Potential to Achieve Goals: Good ADL Goals Pt Will Perform Grooming: (P) with min assist;standing Pt Will Perform Upper Body Bathing: (P) with set-up;sitting Pt Will Perform Lower Body Bathing: (P) with min assist;sit to/from  stand;with adaptive equipment Pt Will Perform Upper Body Dressing: (P) with set-up;sitting Pt Will Perform Lower Body Dressing: (P) with min assist;sit to/from stand;with adaptive equipment Pt Will Transfer to Toilet: (P) with min assist;ambulating;regular height toilet;bedside commode;grab bars Pt Will Perform Toileting - Clothing Manipulation and hygiene: (P) with min assist;sit to/from stand  OT Frequency: Min 2X/week   Barriers to D/C:            Co-evaluation PT/OT/SLP Co-Evaluation/Treatment: Yes Reason for Co-Treatment: For patient/therapist safety;To address functional/ADL transfers   OT goals addressed during session: ADL's and self-care      AM-PAC OT "6 Clicks" Daily Activity     Outcome Measure Help from another person eating meals?: A Little Help from another person taking care of personal grooming?: A Little Help from another person toileting, which includes using toliet, bedpan, or urinal?: A Lot Help from another person bathing (including washing, rinsing, drying)?: A Lot Help from another person to put on and taking off regular upper body clothing?: A Lot Help from another person to put on and taking off regular lower body clothing?: Total 6 Click Score: 13   End of Session Equipment Utilized During Treatment: Rolling walker;Oxygen;Back brace Nurse Communication: Mobility status  Activity Tolerance: Patient tolerated treatment well Patient left: in bed;with call bell/phone within reach  OT Visit Diagnosis: Unsteadiness on feet (R26.81)  Time: 2248-2500 OT Time Calculation (min): 37 min Charges:  OT General Charges $OT Visit: 1 Visit OT Evaluation $OT Eval Moderate Complexity: 1 Mod  Nilsa Nutting., OTR/L Acute Rehabilitation Services Pager [phone] Office [phone]   Lucille Passy M 08/19/2020, 4:46 PM

## 2020-08-19 NOTE — NC FL2 (Signed)
[place] LEVEL OF CARE SCREENING TOOL     IDENTIFICATION  Patient Name: [name] [name] Birthdate: Jun 20, 1926 Sex: male Admission Date (Current Location): 08/17/2020  [facility] and [place] Number:  Herbalist and Address:  [facility] [place] [address], [place], [place] [address]      Provider Number: [id number]  Attending Physician Name and Address:  Annita Brod, MD  Relative Name and Phone Number:  Rogue Jury (Spouse)   [phone]    Current Level of Care: Hospital Recommended Level of Care: [place] Prior Approval Number:    Date Approved/Denied:   PASRR Number: [id number]  Discharge Plan: SNF    Current Diagnoses: Patient Active Problem List   Diagnosis Date Noted   Vertebral fracture, osteoporotic ([place]) 08/18/2020   Multifocal pneumonia 08/17/2020   T8 vertebral fracture ([place]) 08/17/2020   Thrombocytopenia ([place]) 08/17/2020   CKD (chronic kidney disease) stage 3, GFR 30-59 ml/min ([place]) 08/17/2020   Elevated troponin 08/17/2020   Pain due to onychomycosis of toenails of both feet 07/27/2020   Callus of heel 07/27/2020   Bifascicular block 11/11/2019   Acute on chronic diastolic heart failure ([place]) 11/10/2019   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   Severe aortic stenosis 09/29/2019   OSA (obstructive sleep apnea) 11/18/2014   Thoracic ascending aortic aneurysm ([place]) 09/29/2012   S/P laparoscopic cholecystectomy 04/09/2012   S/P CABG x 3 03/06/2012   S/P Maze operation for atrial fibrillation 03/06/2012   Paroxysmal atrial fibrillation ([place]) 02/29/2012   CAD (coronary artery disease), native coronary artery    Hyperlipidemia    Obesity (BMI 30-39.9)    Hypertension    Chronic venous insufficiency    Gout    Benign prostatic hyperplasia     Orientation RESPIRATION BLADDER Height & Weight     Self, Time, Situation, Place  Other (Comment) (nasal cannula)  Continent, External catheter Weight: 222 lb (100.7 kg) Height:  6\' 1"  (185.4 cm)  BEHAVIORAL SYMPTOMS/MOOD NEUROLOGICAL BOWEL NUTRITION STATUS      Continent Diet (see d/c summary)  AMBULATORY STATUS COMMUNICATION OF NEEDS Skin   Extensive Assist Verbally Normal                       Personal Care Assistance Level of Assistance  Bathing, Feeding, Dressing Bathing Assistance: Limited assistance Feeding assistance: Limited assistance Dressing Assistance: Maximum assistance     Functional Limitations Info  Sight, Hearing, Speech Sight Info: Adequate Hearing Info: Adequate Speech Info: Adequate    SPECIAL CARE FACTORS FREQUENCY  PT (By licensed PT), OT (By licensed OT)     PT Frequency: 5x/ week OT Frequency: 5x/ week            Contractures Contractures Info: Not present    Additional Factors Info  Code Status, Allergies Code Status Info: DNR Allergies Info: Amoxicillin   Bee Venom   Avelox (Moxifloxacin)   Duloxetine Hcl   Levitra (Vardenafil)   Morphine And Related   Testosterone   Viagra (Sildenafil)           Current Medications (08/19/2020):  This is the current hospital active medication list Current Facility-Administered Medications  Medication Dose Route Frequency Provider Last Rate Last Admin   0.9 %  sodium chloride infusion  250 mL Intravenous PRN Orma Flaming, MD       acetaminophen (TYLENOL) tablet 650 mg  650 mg Oral Q6H PRN Orma Flaming, MD  Or   acetaminophen (TYLENOL) suppository 650 mg  650 mg Rectal Q6H PRN Orma Flaming, MD       amiodarone (PACERONE) tablet 200 mg  200 mg Oral Daily Wyvonnia Dusky, MD   200 mg at 08/19/20 1044   amLODipine (NORVASC) tablet 10 mg  10 mg Oral Daily Wyvonnia Dusky, MD   10 mg at 08/19/20 1044   azithromycin (ZITHROMAX) tablet 250 mg  250 mg Oral Daily Orma Flaming, MD   250 mg at 08/19/20 1044   cholecalciferol (VITAMIN D3) tablet 1,000 Units  1,000 Units Oral Daily Orma Flaming, MD   1,000  Units at 08/19/20 1044   cyclobenzaprine (FLEXERIL) tablet 10 mg  10 mg Oral TID PRN Orma Flaming, MD       docusate sodium (COLACE) capsule 100 mg  100 mg Oral BID Barb Merino, MD   100 mg at 08/19/20 1050   ezetimibe (ZETIA) tablet 10 mg  10 mg Oral Daily Wyvonnia Dusky, MD   10 mg at 08/19/20 1042   furosemide (LASIX) tablet 40 mg  40 mg Oral BID Barb Merino, MD   40 mg at 08/19/20 0830   gabapentin (NEURONTIN) capsule 200 mg  200 mg Oral TID Barb Merino, MD   200 mg at 08/19/20 1042   galantamine (RAZADYNE ER) 24 hr capsule 8 mg  8 mg Oral Q breakfast Orma Flaming, MD   8 mg at 08/19/20 0830   hydrALAZINE (APRESOLINE) tablet 25 mg  25 mg Oral BID Orma Flaming, MD   25 mg at 08/19/20 1044   HYDROcodone-acetaminophen (NORCO/VICODIN) 5-325 MG per tablet 1-2 tablet  1-2 tablet Oral Q4H PRN Orma Flaming, MD   2 tablet at 08/17/20 2234   isosorbide mononitrate (IMDUR) 24 hr tablet 60 mg  60 mg Oral QPC supper Wyvonnia Dusky, MD   60 mg at 08/18/20 1735   leflunomide (ARAVA) tablet 10 mg  10 mg Oral Daily Orma Flaming, MD   10 mg at 08/19/20 1043   memantine (NAMENDA) tablet 10 mg  10 mg Oral BID Orma Flaming, MD   10 mg at 08/19/20 1044   nitroGLYCERIN (NITROSTAT) SL tablet 0.4 mg  0.4 mg Sublingual Q5 Min x 3 PRN Orma Flaming, MD       omega-3 acid ethyl esters (LOVAZA) capsule 1 g  1 g Oral Daily Orma Flaming, MD   1 g at 08/19/20 1044   pantoprazole (PROTONIX) EC tablet 40 mg  40 mg Oral Auburn Bilberry, MD   40 mg at 08/18/20 1017   potassium chloride SA (KLOR-CON) CR tablet 40 mEq  40 mEq Oral BID Barb Merino, MD   40 mEq at 08/19/20 1044   predniSONE (DELTASONE) tablet 9 mg  9 mg Oral Q breakfast Orma Flaming, MD   9 mg at 08/19/20 0830   psyllium (HYDROCIL/METAMUCIL) 1 packet  1 packet Oral Daily Orma Flaming, MD   1 packet at 08/19/20 1042   Rivaroxaban (XARELTO) tablet 15 mg  15 mg Oral Daily Wyvonnia Dusky, MD   15 mg at 08/19/20 1044   sodium  chloride flush (NS) 0.9 % injection 3 mL  3 mL Intravenous Q12H Orma Flaming, MD   3 mL at 08/19/20 1050   sodium chloride flush (NS) 0.9 % injection 3 mL  3 mL Intravenous PRN Orma Flaming, MD       vitamin B-12 (CYANOCOBALAMIN) tablet 1,000 mcg  1,000 mcg Oral Daily Orma Flaming, MD  1,000 mcg at 08/19/20 1044     Discharge Medications: Please see discharge summary for a list of discharge medications.  Relevant Imaging Results:  Relevant Lab Results:   Additional Information SSN:  [id number];  [employer] COVID-19 Vaccine 02/02/2019 , 01/12/2019 (patient self  reports having 2 boosters)  Ladonna Vanorder F Allahna Husband, LCSWA

## 2020-08-20 ENCOUNTER — Other Ambulatory Visit: Payer: Self-pay | Admitting: Cardiology

## 2020-08-20 ENCOUNTER — Other Ambulatory Visit: Payer: Self-pay | Admitting: Student

## 2020-08-20 DIAGNOSIS — I5033 Acute on chronic diastolic (congestive) heart failure: Secondary | ICD-10-CM | POA: Diagnosis not present

## 2020-08-20 DIAGNOSIS — I5032 Chronic diastolic (congestive) heart failure: Secondary | ICD-10-CM

## 2020-08-20 LAB — BASIC METABOLIC PANEL
Anion gap: 10 (ref 5–15)
BUN: 19 mg/dL (ref 8–23)
CO2: 24 mmol/L (ref 22–32)
Calcium: 8.4 mg/dL — ABNORMAL LOW (ref 8.9–10.3)
Chloride: 100 mmol/L (ref 98–111)
Creatinine, Ser: 1.59 mg/dL — ABNORMAL HIGH (ref 0.61–1.24)
GFR, Estimated: 40 mL/min — ABNORMAL LOW (ref >60)
Glucose, Bld: 102 mg/dL — ABNORMAL HIGH (ref 70–99)
Potassium: 3.6 mmol/L (ref 3.5–5.1)
Sodium: 134 mmol/L — ABNORMAL LOW (ref 135–145)

## 2020-08-20 LAB — BRAIN NATRIURETIC PEPTIDE: B Natriuretic Peptide: 227.9 pg/mL — ABNORMAL HIGH (ref 0.0–100.0)

## 2020-08-20 MED ORDER — TAMSULOSIN HCL 0.4 MG PO CAPS
0.4000 mg | ORAL_CAPSULE | Freq: Every day | ORAL | Status: DC
  Administered 2020-08-20 – 2020-08-22 (×3): 0.4 mg via ORAL
  Filled 2020-08-20 (×4): qty 1

## 2020-08-20 MED ORDER — FUROSEMIDE 10 MG/ML IJ SOLN
40.0000 mg | Freq: Two times a day (BID) | INTRAMUSCULAR | Status: DC
  Administered 2020-08-20 – 2020-08-23 (×6): 40 mg via INTRAVENOUS
  Filled 2020-08-20 (×6): qty 4

## 2020-08-20 MED ORDER — OXYCODONE HCL 5 MG PO TABS: 5.0000 mg | ORAL_TABLET | ORAL | Status: DC | PRN

## 2020-08-20 MED ORDER — OXYCODONE HCL 5 MG PO TABS
2.5000 mg | ORAL_TABLET | ORAL | Status: DC | PRN
Start: 2020-08-20 — End: 2020-08-23
  Administered 2020-08-20 – 2020-08-23 (×2): 2.5 mg via ORAL
  Filled 2020-08-20 (×2): qty 1

## 2020-08-20 MED ORDER — ACETAMINOPHEN 500 MG PO TABS
1000.0000 mg | ORAL_TABLET | Freq: Three times a day (TID) | ORAL | Status: DC
  Administered 2020-08-20 – 2020-08-23 (×9): 1000 mg via ORAL
  Filled 2020-08-20 (×8): qty 2

## 2020-08-20 NOTE — Progress Notes (Signed)
Respiratory called to give patient CPAP.

## 2020-08-20 NOTE — Progress Notes (Signed)
[facility] PROGRESS NOTE    [name] [name]  KXF:[id number] DOB: Jun 07, 1926 DOA: 08/17/2020 PCP: [name], MD      Brief Narrative:  [name] [name] is a 87 y.o. M with pAF on Xarelto, AAA, dCHF, CAD s./p CABG, severe AS s/p TAVR, CKD IIIa, OSA, PMR on steroids (now 9 mg) c/b compression fracture 6 months ago presented with fall and new acute back pain.    In the ER, x-ray confirmed compression fracture.  IR consulted, recommend no kyphoplasty.        Assessment & Plan:  T9 compression fracture -Scheduled Tylenol - Low-dose oxycodone as needed - Stop cyclobenzaprine due to somnolence - PT   Somnolence Patient somnolent today after cyclobenzaprine - Stop Flexeril - If no improvement, will reduce gabapentin   Urinary retention Patient with some suprapubic discomfort today, bladder scan showed 900 cc, INO cath resulted over a liter.  First time.  Known BPH.  No other urinary irritative symptoms.  Imaging suggested  - Start Flomax - Stop cyclobenzaprine - Monitor WBC and fever curve and urinary symptoms    Coronary artery disease, secondary prevention Hypertension -Continue Imdur, amlodipine, Zetia  Likely acute on chronic diastolic CHF Volume status difficult to assess.  No dyspnea at rest, but BNP up, bilateral effusion on CXR and in light of normal procal and hypoxia, I think this is CHF flare.  Cr improved initially with diuresis.   -Furosemide 40 mg IV twice a day  -Strict I/Os, daily weights, telemetry  -Daily monitoring renal function  AAA -Continue outpatient monitoring  CKD stage IIIa Cr 1.5, stable from yesterday  Thrombocytopenia Stable  Elevated troponin No ischemia, no ischemic work-up necessary  Polymyalgia rheumatica -Continue prednisone 9 mg daily -Continue Arava  Paroxysmal atrial fibrillation HR controlled -Continue Xarelto -Continue amiodarone  Anemia Hgb stable  Other medicaitons -Continue galantamine and  gabapentin         Disposition: Status is: Inpatient  Remains inpatient appropriate because:Unsafe d/c plan  Dispo: The patient is from: Home              Anticipated d/c is to: SNF              Patient currently is not medically stable to d/c.   Difficult to place patient No    Patient here for T9 compression fracture.  Also appears to have a CHF flare.  We will diurese 1 more day, hopefully he will be ready for for discharge and off oxygen by Monday.   Level of care: Med-Surg       MDM: The below labs and imaging reports were reviewed and summarized above.  Medication management as above.    DVT prophylaxis:  Rivaroxaban (XARELTO) tablet 15 mg  Code Status: FULL Family Communication: None present           Subjective: He is very sleepy today.  He has no chest pain, dyspnea at rest, his back pain is relatively well controlled.     Objective: Vitals:   08/19/20 1948 08/19/20 2153 08/20/20 0822 08/20/20 1519  BP:  111/64 (!) 110/59 (!) 149/78  Pulse:  72 96 78  Resp:  16 18 14   Temp:  97.7 F (36.5 C) 98.6 F (37 C) 98 F (36.7 C)  TempSrc:  Oral Oral Oral  SpO2: 93% 95% 95% 96%  Weight:      Height:        Intake/Output Summary (Last 24 hours) at 08/20/2020 1646 Last data  filed at 08/19/2020 2011 Gross per 24 hour  Intake --  Output 1650 ml  Net -1650 ml   Filed Weights   08/17/20 0924 08/19/20 0500  Weight: 100.7 kg 100.7 kg    Examination: General appearance: elderlhy adult male, asleep, rouses after a minute and in no acute distress.   HEENT: Anicteric, conjunctiva pink, lids and lashes normal. No nasal deformity, discharge, epistaxis.  Lips moist.   Skin: Warm and dry.  no jaundice.  No suspicious rashes or lesions. Cardiac: RRR, nl U9-W1, systolic murmur noted.  Capillary refill is brisk.  JVP not visible.  No LE edema.  Radial  pulses 2+ and symmetric. Respiratory: Normal respiratory rate and rhythm.  CTAB without rales or  wheezes. Abdomen: Abdomen soft.  no TTP. No ascites, distension, hepatosplenomegaly.   MSK: No deformities or effusions. Neuro: Sleeping but arousable and oriented, moves upper extremities with generalized weakness but symmetric strength, speech fluent.  Face symmetric. Psych: Sensorium intact and responding to questions, attention distracted due to sleepiness, affect blunted, judgment and insight appear normal     Data Reviewed: I have personally reviewed following labs and imaging studies:  CBC: Recent Labs  Lab 08/17/20 0912 08/17/20 1323 08/17/20 1932  WBC 5.5 5.2 6.2  NEUTROABS 3.7 3.7 4.8  HGB 9.0* 9.4* 8.6*  HCT 28.4* 30.2* 28.1*  MCV 98.3 99.0 100.7*  PLT 85* 89* 83*   Basic Metabolic Panel: Recent Labs  Lab 08/17/20 0912 08/17/20 1323 08/18/20 0325 08/20/20 0030  NA 140  --  137 134*  K 3.6  --  3.2* 3.6  CL 106  --  104 100  CO2 27  --  24 24  GLUCOSE 110*  --  119* 102*  BUN 20  --  17 19  CREATININE 1.40*  --  1.37* 1.59*  CALCIUM 8.7*  --  8.5* 8.4*  MG  --  2.6* 2.4  --    GFR: Estimated Creatinine Clearance: 37 mL/min (A) (by C-G formula based on SCr of 1.59 mg/dL (H)). Liver Function Tests: No results for input(s): AST, ALT, ALKPHOS, BILITOT, PROT, ALBUMIN in the last 168 hours. No results for input(s): LIPASE, AMYLASE in the last 168 hours. No results for input(s): AMMONIA in the last 168 hours. Coagulation Profile: No results for input(s): INR, PROTIME in the last 168 hours. Cardiac Enzymes: Recent Labs  Lab 08/17/20 0912  CKTOTAL 38*   BNP (last 3 results) No results for input(s): PROBNP in the last 8760 hours. HbA1C: No results for input(s): HGBA1C in the last 72 hours. CBG: No results for input(s): GLUCAP in the last 168 hours. Lipid Profile: No results for input(s): CHOL, HDL, LDLCALC, TRIG, CHOLHDL, LDLDIRECT in the last 72 hours. Thyroid Function Tests: No results for input(s): TSH, T4TOTAL, FREET4, T3FREE, THYROIDAB in the last 72  hours. Anemia Panel: No results for input(s): VITAMINB12, FOLATE, FERRITIN, TIBC, IRON, RETICCTPCT in the last 72 hours. Urine analysis:    Component Value Date/Time   COLORURINE YELLOW 08/17/2020 2219   APPEARANCEUR CLEAR 08/17/2020 2219   LABSPEC 1.012 08/17/2020 2219   PHURINE 7.0 08/17/2020 2219   GLUCOSEU NEGATIVE 08/17/2020 2219   HGBUR NEGATIVE 08/17/2020 2219   BILIRUBINUR NEGATIVE 08/17/2020 2219   KETONESUR NEGATIVE 08/17/2020 2219   PROTEINUR NEGATIVE 08/17/2020 2219   UROBILINOGEN 1.0 03/22/2012 0845   NITRITE NEGATIVE 08/17/2020 2219   LEUKOCYTESUR NEGATIVE 08/17/2020 2219   Sepsis Labs: @LABRCNTIP (procalcitonin:4,lacticacidven:4)  ) Recent Results (from the past 240 hour(s))  Resp Panel  by RT-PCR (Flu A&B, Covid) Nasopharyngeal Swab     Status: None   Collection Time: 08/17/20 12:10 PM   Specimen: Nasopharyngeal Swab; Nasopharyngeal(NP) swabs in vial transport medium  Result Value Ref Range Status   SARS Coronavirus 2 by RT PCR NEGATIVE NEGATIVE Final    Comment: (NOTE) SARS-CoV-2 target nucleic acids are NOT DETECTED.  The SARS-CoV-2 RNA is generally detectable in upper respiratory specimens during the acute phase of infection. The lowest concentration of SARS-CoV-2 viral copies this assay can detect is 138 copies/mL. A negative result does not preclude SARS-Cov-2 infection and should not be used as the sole basis for treatment or other patient management decisions. A negative result may occur with  improper specimen collection/handling, submission of specimen other than nasopharyngeal swab, presence of viral mutation(s) within the areas targeted by this assay, and inadequate number of viral copies(<138 copies/mL). A negative result must be combined with clinical observations, patient history, and epidemiological information. The expected result is Negative.  Fact Sheet for Patients:  EntrepreneurPulse.com.au  Fact Sheet for Healthcare  Providers:  IncredibleEmployment.be  This test is no t yet approved or cleared by the [place] FDA and  has been authorized for detection and/or diagnosis of SARS-CoV-2 by FDA under an Emergency Use Authorization (EUA). This EUA will remain  in effect (meaning this test can be used) for the duration of the COVID-19 declaration under Section 564(b)(1) of the Act, 21 U.S.C.section 360bbb-3(b)(1), unless the authorization is terminated  or revoked sooner.       Influenza A by PCR NEGATIVE NEGATIVE Final   Influenza B by PCR NEGATIVE NEGATIVE Final    Comment: (NOTE) The Xpert Xpress SARS-CoV-2/FLU/RSV plus assay is intended as an aid in the diagnosis of influenza from Nasopharyngeal swab specimens and should not be used as a sole basis for treatment. Nasal washings and aspirates are unacceptable for Xpert Xpress SARS-CoV-2/FLU/RSV testing.  Fact Sheet for Patients: EntrepreneurPulse.com.au  Fact Sheet for Healthcare Providers: IncredibleEmployment.be  This test is not yet approved or cleared by the [place] FDA and has been authorized for detection and/or diagnosis of SARS-CoV-2 by FDA under an Emergency Use Authorization (EUA). This EUA will remain in effect (meaning this test can be used) for the duration of the COVID-19 declaration under Section 564(b)(1) of the Act, 21 U.S.C. section 360bbb-3(b)(1), unless the authorization is terminated or revoked.  Performed at  [facility], [place] [address], [place], [place] [address]   Culture, blood (routine x 2)     Status: None (Preliminary result)   Collection Time: 08/17/20  2:50 PM   Specimen: BLOOD LEFT FOREARM  Result Value Ref Range Status   Specimen Description BLOOD LEFT FOREARM  Final   Special Requests   Final    BOTTLES DRAWN AEROBIC AND ANAEROBIC Blood Culture adequate volume   Culture   Final    NO GROWTH 3 DAYS Performed at [facility],  [place] [address] [place], [place] [address]    Report Status PENDING  Incomplete  Culture, blood (routine x 2)     Status: None (Preliminary result)   Collection Time: 08/17/20  2:58 PM   Specimen: BLOOD  Result Value Ref Range Status   Specimen Description BLOOD LEFT ANTECUBITAL  Final   Special Requests   Final    BOTTLES DRAWN AEROBIC AND ANAEROBIC Blood Culture adequate volume   Culture   Final    NO GROWTH 3 DAYS Performed at [facility], [place]  [address], [place], [place] [address]    Report Status PENDING  Incomplete         Radiology Studies: No results found.      Scheduled Meds:  acetaminophen  1,000 mg Oral TID   amiodarone  200 mg Oral Daily   amLODipine  10 mg Oral Daily   cholecalciferol  1,000 Units Oral Daily   docusate sodium  100 mg Oral BID   ezetimibe  10 mg Oral Daily   furosemide  20 mg Intravenous Once   furosemide  40 mg Oral BID   gabapentin  200 mg Oral TID   galantamine  8 mg Oral Q breakfast   hydrALAZINE  25 mg Oral BID   isosorbide mononitrate  60 mg Oral QPC supper   leflunomide  10 mg Oral Daily   memantine  10 mg Oral BID   omega-3 acid ethyl esters  1 g Oral Daily   pantoprazole  40 mg Oral Daily   polyethylene glycol  17 g Oral Daily   predniSONE  9 mg Oral Q breakfast   psyllium  1 packet Oral Daily   Rivaroxaban  15 mg Oral Daily   sodium chloride flush  3 mL Intravenous Q12H   vitamin B-12  1,000 mcg Oral Daily   Continuous Infusions:  sodium chloride       LOS: 2 days    Time spent: 25 minutes    Edwin Dada, MD [facility] 08/20/2020, 4:46 PM     Please page though [place] or Epic secure chat:  For [employer], Adult nurse

## 2020-08-20 NOTE — Progress Notes (Signed)
RT NOTES: Spoke with patient about CPAP use. Patient states he wears one at home but doesn't think he needs one here and is not sure if he will wear it or not. CPAP machine left in room just in case. Told patient RT would check in with him tonight to see if he wants to wear it.

## 2020-08-20 NOTE — Plan of Care (Signed)
  Problem: Education: Goal: Knowledge of General Education information will improve Description Including pain rating scale, medication(s)/side effects and non-pharmacologic comfort measures Outcome: Progressing   Problem: Health Behavior/Discharge Planning: Goal: Ability to manage health-related needs will improve Outcome: Progressing   

## 2020-08-20 NOTE — Plan of Care (Signed)
VSS. Patient remains on 2L [place]. Per patient he does not wear his CPAP at home and does not want it here because he is on oxygen. Patient educated on using a CPAP when sleeping. C/o pain, refused pain medication, in agreement with muscle relaxer. Patient very sleepy since given muscle relaxer, medication discontinued by provider. Primafit in place. Call bell within reach. Bed alarm on. Wife at bedside this afternoon.   Problem: Skin Integrity: Goal: Risk for impaired skin integrity will decrease Outcome: Progressing   Problem: Safety: Goal: Ability to remain free from injury will improve Outcome: Progressing   Problem: Pain Managment: Goal: General experience of comfort will improve Outcome: Progressing   Problem: Elimination: Goal: Will not experience complications related to bowel motility Outcome: Progressing Goal: Will not experience complications related to urinary retention Outcome: Progressing   Problem: Activity: Goal: Risk for activity intolerance will decrease Outcome: Progressing   Problem: Clinical Measurements: Goal: Ability to maintain clinical measurements within normal limits will improve Outcome: Progressing Goal: Will remain free from infection Outcome: Progressing Goal: Diagnostic test results will improve Outcome: Progressing Goal: Respiratory complications will improve Outcome: Progressing Goal: Cardiovascular complication will be avoided Outcome: Progressing

## 2020-08-20 NOTE — TOC Progression Note (Signed)
Transition of Care [facility]) - Progression Note    Patient Details  Name: [name] [name] MRN: [id number] Date of Birth: 30-Sep-1926  [facility] CM/SW Contact  Joanne Chars, LCSW Phone Number: 08/20/2020, 11:59 AM  Clinical Narrative:  CSW presented bed offers to pt and wife.  Choice document also given.  They are asking about [facility] specifically: referral sent in hub and message left for Seth Bake requesting that she review.  [facility] is their first choice, they will look at the current bed offers.     Expected Discharge Plan: Skilled Nursing Facility Barriers to Discharge: Ship broker, SNF Pending bed offer  Expected Discharge Plan and Services Expected Discharge Plan: [place] arrangements for the past 2 months: Single Family Home                                       Social Determinants of Health (SDOH) Interventions    Readmission Risk Interventions No flowsheet data found.

## 2020-08-20 NOTE — Progress Notes (Addendum)
Patient c/o abdominal discomfort, pt states "I cant pee". Patient bladder scanned for 909cc. Provider paged for I&O cath orders. Patient I&O cath'd for 1900cc. Provider made aware about the amount, RN asked for UA orders, Provider stated not at this time and continue to monitor.  See Dr. Loleta Books note for more details.

## 2020-08-21 DIAGNOSIS — N1831 Chronic kidney disease, stage 3a: Secondary | ICD-10-CM | POA: Diagnosis not present

## 2020-08-21 DIAGNOSIS — S22060D Wedge compression fracture of T7-T8 vertebra, subsequent encounter for fracture with routine healing: Secondary | ICD-10-CM | POA: Diagnosis not present

## 2020-08-21 DIAGNOSIS — I5033 Acute on chronic diastolic (congestive) heart failure: Secondary | ICD-10-CM | POA: Diagnosis not present

## 2020-08-21 LAB — CBC
HCT: 27.9 % — ABNORMAL LOW (ref 39.0–52.0)
Hemoglobin: 8.9 g/dL — ABNORMAL LOW (ref 13.0–17.0)
MCH: 31 pg (ref 26.0–34.0)
MCHC: 31.9 g/dL (ref 30.0–36.0)
MCV: 97.2 fL (ref 80.0–100.0)
Platelets: 99 10*3/uL — ABNORMAL LOW (ref 150–400)
RBC: 2.87 MIL/uL — ABNORMAL LOW (ref 4.22–5.81)
RDW: 15.6 % — ABNORMAL HIGH (ref 11.5–15.5)
WBC: 6 10*3/uL (ref 4.0–10.5)
nRBC: 0 % (ref 0.0–0.2)

## 2020-08-21 LAB — URINALYSIS, ROUTINE W REFLEX MICROSCOPIC
Bilirubin Urine: NEGATIVE
Glucose, UA: NEGATIVE mg/dL
Hgb urine dipstick: NEGATIVE
Ketones, ur: NEGATIVE mg/dL
Leukocytes,Ua: NEGATIVE
Nitrite: NEGATIVE
Protein, ur: NEGATIVE mg/dL
Specific Gravity, Urine: 1.01 (ref 1.005–1.030)
pH: 6 (ref 5.0–8.0)

## 2020-08-21 LAB — BASIC METABOLIC PANEL
Anion gap: 8 (ref 5–15)
BUN: 20 mg/dL (ref 8–23)
CO2: 27 mmol/L (ref 22–32)
Calcium: 8.7 mg/dL — ABNORMAL LOW (ref 8.9–10.3)
Chloride: 102 mmol/L (ref 98–111)
Creatinine, Ser: 1.56 mg/dL — ABNORMAL HIGH (ref 0.61–1.24)
GFR, Estimated: 41 mL/min — ABNORMAL LOW (ref >60)
Glucose, Bld: 95 mg/dL (ref 70–99)
Potassium: 3.3 mmol/L — ABNORMAL LOW (ref 3.5–5.1)
Sodium: 137 mmol/L (ref 135–145)

## 2020-08-21 MED ORDER — CHLORHEXIDINE GLUCONATE CLOTH 2 % EX PADS
6.0000 | MEDICATED_PAD | Freq: Every day | CUTANEOUS | Status: DC
  Administered 2020-08-21 – 2020-08-22 (×2): 6 via TOPICAL

## 2020-08-21 NOTE — Progress Notes (Signed)
During 1000 rounds, patient c/o abd pain and states I do not think my lasix is working. Minimal output in external cath. Patient bladder scanned for >999cc. Dr. Kyung Bacca paged about findings and order for I&O cath. Provider paged twice. After second page, provider on their way to bedside.   Patient I&O cath'd for 1550cc. UA will be sent. Patient due to void, Plan for Bladder scan at 1600. If retaining again, place Foley catheter.

## 2020-08-21 NOTE — Progress Notes (Addendum)
PROGRESS NOTE  [name] [name] DJM:[id number] DOB: [birth year] DOA: 08/17/2020 PCP: [name], MD  HPI/Recap of past 17 hours: 87 year old male with a history of paroxysmal atrial fibrillation on Xarelto, coronary artery disease SP CABG, diastolic CHF, chronic kidney disease stage III AAA severe arctic stenosis status post TAVR, AAA.  He was admitted with compression fracture and interventional radiology was consulted for possible kyphoplasty  Patient seen and examined at bedside he has been having problem with urinary retention.  Yesterday he had urinary retention with abdominal pain and In-N-Out catheter yielded more than 900 cc of urine.  He was said to have been able to void last night he has a condom catheter he voided about 400 mL this morning he started to have abdominal pain In-N-Out cath was done and it had over 900 cc of urine.  Assessment/Plan: Active Problems:   CAD (coronary artery disease), native coronary artery   Hyperlipidemia   Hypertension   Paroxysmal atrial fibrillation (HCC)   Thoracic ascending aortic aneurysm (HCC)   OSA (obstructive sleep apnea)   Acute on chronic diastolic heart failure (HCC)   T8 vertebral fracture (HCC)   Thrombocytopenia (HCC)   CKD (chronic kidney disease) stage 3, GFR 30-59 ml/min (HCC)   Elevated troponin   Overweight (BMI 25.0-29.9)  #1 urinary retention.  Patient stated that he had history of prostate enlargement status post partial resection which was many years ago.  Nurse was able to put Foley catheter in and out without any obstruction or problem.  If he continues to have another episode of retention needing in and out cath will put in an indwelling Foley catheter. Flomax was started yesterday we will continue Flomax his second benazepril has been stopped.  His family requested for a UA and and that has been ordered  2.  T8-9 compression fracture Interventional radiology feels it might be an artifact and there is no reason for  kyphoplasty at this time.  Continue TLSO brace Continue Tylenol for pain as well as low-dose oxycodone as needed PT consulted  3.  Coronary artery disease Hypertension Continue Imdur amlodipine and Zetia  4.  Acute on chronic diastolic systolics congestive heart failure Continue Lasix 40 mg twice daily IV Strict I&O with weight Daily renal function monitoring  5.  Chronic kidney disease stage IIIa, continue to monitor creatinine  6.  Paroxysmal atrial fibrillation Heart rate is controlled Continue Xarelto and amiodarone  7.  Anemia hemoglobin is stable  8.  His blood culture shows no growth after 4 days  9.  Thrombocytopenia which is stable continue monitoring  10.  Polymyalgia rheumatica on chronic steroid Continue steroid  Code Status:   Severity of Illness: The appropriate patient status for this patient is INPATIENT. Inpatient status is judged to be reasonable and necessary in order to provide the required intensity of service to ensure the patient's safety. The patient's presenting symptoms, physical exam findings, and initial radiographic and laboratory data in the context of their chronic comorbidities is felt to place them at high risk for further clinical deterioration. Furthermore, it is not anticipated that the patient will be medically stable for discharge from the hospital within 2 midnights of admission. The following factors support the patient status of inpatient.   " Compression fracture needing further treatment  * I certify that at the point of admission it is my clinical judgment that the patient will require inpatient hospital care spanning beyond 2 midnights from the point of admission due to high  intensity of service, high risk for further deterioration and high frequency of surveillance required.*   Family Communication: None at bedside  Disposition Plan:  Status is: Inpatient   Dispo: The patient is from: Home              Anticipated d/c is to:  SNF              Anticipated d/c date is: 08/22/20              Patient currently not medically stable for discharge  Consultants: Interventional radiology  Procedures: None  Antimicrobials: Zithromax 8/18 to 08/19/2020  DVT prophylaxis: Xarelto   Objective: Vitals:   08/20/20 2045 08/20/20 2245 08/21/20 0622 08/21/20 0740  BP: 120/62   113/75  Pulse: 91 80  91  Resp: 17 16  18   Temp: 97.6 F (36.4 C)   98.2 F (36.8 C)  TempSrc:      SpO2: 96% 95%  90%  Weight:   104.9 kg   Height:        Intake/Output Summary (Last 24 hours) at 08/21/2020 0854 Last data filed at 08/21/2020 0700 Gross per 24 hour  Intake --  Output 2300 ml  Net -2300 ml   Filed Weights   08/17/20 0924 08/19/20 0500 08/21/20 0622  Weight: 100.7 kg 100.7 kg 104.9 kg   Body mass index is 30.51 kg/m.  Exam:  General: 87 y.o. year-old male well developed well nourished in no acute distress.  Alert and oriented x3. Cardiovascular: Regular rate and rhythm with no rubs or gallops.  No thyromegaly or JVD noted.   Respiratory: Clear to auscultation with no wheezes or rales. Good inspiratory effort. Abdomen: Soft nontender nondistended with normal bowel sounds x4 quadrants. Musculoskeletal: No lower extremity edema. 2/4 pulses in all 4 extremities. Skin: No ulcerative lesions noted or rashes, Psychiatry: Mood is appropriate for condition and setting Neurology:    Data Reviewed: CBC: Recent Labs  Lab 08/17/20 0912 08/17/20 1323 08/17/20 1932 08/21/20 0311  WBC 5.5 5.2 6.2 6.0  NEUTROABS 3.7 3.7 4.8  --   HGB 9.0* 9.4* 8.6* 8.9*  HCT 28.4* 30.2* 28.1* 27.9*  MCV 98.3 99.0 100.7* 97.2  PLT 85* 89* 83* 99*   Basic Metabolic Panel: Recent Labs  Lab 08/17/20 0912 08/17/20 1323 08/18/20 0325 08/20/20 0030 08/21/20 0311  NA 140  --  137 134* 137  K 3.6  --  3.2* 3.6 3.3*  CL 106  --  104 100 102  CO2 27  --  24 24 27   GLUCOSE 110*  --  119* 102* 95  BUN 20  --  17 19 20   CREATININE  1.40*  --  1.37* 1.59* 1.56*  CALCIUM 8.7*  --  8.5* 8.4* 8.7*  MG  --  2.6* 2.4  --   --    GFR: Estimated Creatinine Clearance: 38.4 mL/min (A) (by C-G formula based on SCr of 1.56 mg/dL (H)). Liver Function Tests: No results for input(s): AST, ALT, ALKPHOS, BILITOT, PROT, ALBUMIN in the last 168 hours. No results for input(s): LIPASE, AMYLASE in the last 168 hours. No results for input(s): AMMONIA in the last 168 hours. Coagulation Profile: No results for input(s): INR, PROTIME in the last 168 hours. Cardiac Enzymes: Recent Labs  Lab 08/17/20 0912  CKTOTAL 38*   BNP (last 3 results) No results for input(s): PROBNP in the last 8760 hours. HbA1C: No results for input(s): HGBA1C in the last 72 hours. CBG: No results  for input(s): GLUCAP in the last 168 hours. Lipid Profile: No results for input(s): CHOL, HDL, LDLCALC, TRIG, CHOLHDL, LDLDIRECT in the last 72 hours. Thyroid Function Tests: No results for input(s): TSH, T4TOTAL, FREET4, T3FREE, THYROIDAB in the last 72 hours. Anemia Panel: No results for input(s): VITAMINB12, FOLATE, FERRITIN, TIBC, IRON, RETICCTPCT in the last 72 hours. Urine analysis:    Component Value Date/Time   COLORURINE YELLOW 08/17/2020 2219   APPEARANCEUR CLEAR 08/17/2020 2219   LABSPEC 1.012 08/17/2020 2219   PHURINE 7.0 08/17/2020 2219   GLUCOSEU NEGATIVE 08/17/2020 2219   HGBUR NEGATIVE 08/17/2020 2219   BILIRUBINUR NEGATIVE 08/17/2020 2219   KETONESUR NEGATIVE 08/17/2020 2219   PROTEINUR NEGATIVE 08/17/2020 2219   UROBILINOGEN 1.0 03/22/2012 0845   NITRITE NEGATIVE 08/17/2020 2219   LEUKOCYTESUR NEGATIVE 08/17/2020 2219   Sepsis Labs: @LABRCNTIP (procalcitonin:4,lacticidven:4)  ) Recent Results (from the past 240 hour(s))  Resp Panel by RT-PCR (Flu A&B, Covid) Nasopharyngeal Swab     Status: None   Collection Time: 08/17/20 12:10 PM   Specimen: Nasopharyngeal Swab; Nasopharyngeal(NP) swabs in vial transport medium  Result Value Ref  Range Status   SARS Coronavirus 2 by RT PCR NEGATIVE NEGATIVE Final    Comment: (NOTE) SARS-CoV-2 target nucleic acids are NOT DETECTED.  The SARS-CoV-2 RNA is generally detectable in upper respiratory specimens during the acute phase of infection. The lowest concentration of SARS-CoV-2 viral copies this assay can detect is 138 copies/mL. A negative result does not preclude SARS-Cov-2 infection and should not be used as the sole basis for treatment or other patient management decisions. A negative result may occur with  improper specimen collection/handling, submission of specimen other than nasopharyngeal swab, presence of viral mutation(s) within the areas targeted by this assay, and inadequate number of viral copies(<138 copies/mL). A negative result must be combined with clinical observations, patient history, and epidemiological information. The expected result is Negative.  Fact Sheet for Patients:  EntrepreneurPulse.com.au  Fact Sheet for Healthcare Providers:  IncredibleEmployment.be  This test is no t yet approved or cleared by the [place] FDA and  has been authorized for detection and/or diagnosis of SARS-CoV-2 by FDA under an Emergency Use Authorization (EUA). This EUA will remain  in effect (meaning this test can be used) for the duration of the COVID-19 declaration under Section 564(b)(1) of the Act, 21 U.S.C.section 360bbb-3(b)(1), unless the authorization is terminated  or revoked sooner.       Influenza A by PCR NEGATIVE NEGATIVE Final   Influenza B by PCR NEGATIVE NEGATIVE Final    Comment: (NOTE) The Xpert Xpress SARS-CoV-2/FLU/RSV plus assay is intended as an aid in the diagnosis of influenza from Nasopharyngeal swab specimens and should not be used as a sole basis for treatment. Nasal washings and aspirates are unacceptable for Xpert Xpress SARS-CoV-2/FLU/RSV testing.  Fact Sheet for  Patients: EntrepreneurPulse.com.au  Fact Sheet for Healthcare Providers: IncredibleEmployment.be  This test is not yet approved or cleared by the [place] FDA and has been authorized for detection and/or diagnosis of SARS-CoV-2 by FDA under an Emergency Use Authorization (EUA). This EUA will remain in effect (meaning this test can be used) for the duration of the COVID-19 declaration under Section 564(b)(1) of the Act, 21 U.S.C. section 360bbb-3(b)(1), unless the authorization is terminated or revoked.  Performed at [facility], [place] [address] [place], [place] [address]   Culture, blood (routine x 2)     Status: None (Preliminary result)   Collection Time: 08/17/20  2:50  PM   Specimen: BLOOD LEFT FOREARM  Result Value Ref Range Status   Specimen Description BLOOD LEFT FOREARM  Final   Special Requests   Final    BOTTLES DRAWN AEROBIC AND ANAEROBIC Blood Culture adequate volume   Culture   Final    NO GROWTH 3 DAYS Performed at [facility], [address], [place], [place] [address]    Report Status PENDING  Incomplete  Culture, blood (routine x 2)     Status: None (Preliminary result)   Collection Time: 08/17/20  2:58 PM   Specimen: BLOOD  Result Value Ref Range Status   Specimen Description BLOOD LEFT ANTECUBITAL  Final   Special Requests   Final    BOTTLES DRAWN AEROBIC AND ANAEROBIC Blood Culture adequate volume   Culture   Final    NO GROWTH 3 DAYS Performed at [facility], [place] [address], [place], [place] [address]    Report Status PENDING  Incomplete      Studies: No results found.  Scheduled Meds:  acetaminophen  1,000 mg Oral TID   amiodarone  200 mg Oral Daily   amLODipine  10 mg Oral Daily   cholecalciferol  1,000 Units Oral Daily   docusate sodium  100 mg Oral BID   ezetimibe  10 mg Oral Daily   furosemide  40 mg Intravenous BID   gabapentin  200 mg Oral TID   galantamine  8 mg Oral Q  breakfast   hydrALAZINE  25 mg Oral BID   isosorbide mononitrate  60 mg Oral QPC supper   leflunomide  10 mg Oral Daily   memantine  10 mg Oral BID   omega-3 acid ethyl esters  1 g Oral Daily   pantoprazole  40 mg Oral Daily   polyethylene glycol  17 g Oral Daily   predniSONE  9 mg Oral Q breakfast   psyllium  1 packet Oral Daily   Rivaroxaban  15 mg Oral Daily   sodium chloride flush  3 mL Intravenous Q12H   tamsulosin  0.4 mg Oral QPC supper   vitamin B-12  1,000 mcg Oral Daily    Continuous Infusions:  sodium chloride       LOS: 3 days     Cristal Deer, MD [facility]  To reach me or the doctor on call, go to: [email or web address] Password [facility]  08/21/2020, 8:54 AM

## 2020-08-21 NOTE — Plan of Care (Signed)

## 2020-08-21 NOTE — TOC Progression Note (Addendum)
Transition of Care [facility]) - Progression Note    Patient Details  Name: [name] [name] MRN: [id number] Date of Birth: 06-Jun-1926  [facility] CM/SW Contact  Elliot Gurney [place], [place] Phone Number: [phone] 08/21/2020, 4:28 PM  Clinical Narrative:    [facility] authorization started, authorization pending.  [address] LCSW Transition of Care [phone]    Expected Discharge Plan: Skilled Nursing Facility Barriers to Discharge: Insurance Authorization, SNF Pending bed offer  Expected Discharge Plan and Services Expected Discharge Plan: [place] arrangements for the past 2 months: Single Family Home                                       Social Determinants of Health (SDOH) Interventions    Readmission Risk Interventions No flowsheet data found.

## 2020-08-21 NOTE — Progress Notes (Signed)
Around 1515: Patient c/o abd pain, patient remains DTV since I&O cath earlier this shift.  Bladder scanned for 645cc. Provider already placed order for foley insertion over 300cc.

## 2020-08-21 NOTE — Plan of Care (Signed)
VSS. Pain controlled. Foley placed this afternoon. Call bell within reach. Bed alarm on.   Problem: Elimination: Goal: Will not experience complications related to bowel motility Outcome: Progressing Goal: Will not experience complications related to urinary retention Outcome: Progressing   Problem: Pain Managment: Goal: General experience of comfort will improve Outcome: Progressing   Problem: Safety: Goal: Ability to remain free from injury will improve Outcome: Progressing   Problem: Skin Integrity: Goal: Risk for impaired skin integrity will decrease Outcome: Progressing

## 2020-08-22 ENCOUNTER — Inpatient Hospital Stay (HOSPITAL_COMMUNITY): Payer: Medicare Other

## 2020-08-22 DIAGNOSIS — I5033 Acute on chronic diastolic (congestive) heart failure: Secondary | ICD-10-CM | POA: Diagnosis not present

## 2020-08-22 LAB — CBC
HCT: 30.1 % — ABNORMAL LOW (ref 39.0–52.0)
Hemoglobin: 9.9 g/dL — ABNORMAL LOW (ref 13.0–17.0)
MCH: 31.3 pg (ref 26.0–34.0)
MCHC: 32.9 g/dL (ref 30.0–36.0)
MCV: 95.3 fL (ref 80.0–100.0)
Platelets: 116 10*3/uL — ABNORMAL LOW (ref 150–400)
RBC: 3.16 MIL/uL — ABNORMAL LOW (ref 4.22–5.81)
RDW: 15.2 % (ref 11.5–15.5)
WBC: 7.3 10*3/uL (ref 4.0–10.5)
nRBC: 0 % (ref 0.0–0.2)

## 2020-08-22 LAB — CULTURE, BLOOD (ROUTINE X 2)
Culture: NO GROWTH
Culture: NO GROWTH
Special Requests: ADEQUATE
Special Requests: ADEQUATE

## 2020-08-22 LAB — BASIC METABOLIC PANEL
Anion gap: 11 (ref 5–15)
BUN: 22 mg/dL (ref 8–23)
CO2: 26 mmol/L (ref 22–32)
Calcium: 9 mg/dL (ref 8.9–10.3)
Chloride: 100 mmol/L (ref 98–111)
Creatinine, Ser: 1.45 mg/dL — ABNORMAL HIGH (ref 0.61–1.24)
GFR, Estimated: 45 mL/min — ABNORMAL LOW (ref >60)
Glucose, Bld: 114 mg/dL — ABNORMAL HIGH (ref 70–99)
Potassium: 3 mmol/L — ABNORMAL LOW (ref 3.5–5.1)
Sodium: 137 mmol/L (ref 135–145)

## 2020-08-22 MED ORDER — POTASSIUM CHLORIDE CRYS ER 20 MEQ PO TBCR
40.0000 meq | EXTENDED_RELEASE_TABLET | ORAL | Status: AC
Start: 2020-08-22 — End: 2020-08-22
  Administered 2020-08-22 (×2): 40 meq via ORAL
  Filled 2020-08-22 (×2): qty 2

## 2020-08-22 NOTE — Care Management Important Message (Signed)
Important Message  Patient Details  Name: [name] [name] MRN: [id number] Date of Birth: 03/07/1926   Medicare Important Message Given:  Yes     Rilea Arutyunyan [address] 08/22/2020, 3:55 PM

## 2020-08-22 NOTE — Progress Notes (Signed)
Patient refused CPAP Hs. Patient will call if he changes his mind.

## 2020-08-22 NOTE — Progress Notes (Signed)
PROGRESS NOTE   [name] [name]  IBB:[id number]    DOB: [birth year]    DOA: 08/17/2020  PCP: [name], MD   I have briefly reviewed patients previous medical records in [facility].  Chief Complaint  Patient presents with   Fall    Brief Narrative:  87 year old married male with PMH of paroxysmal A. fib on Xarelto, CAD, severe aortic stenosis s/p TAVR AAA, chronic diastolic CHF, CAD s/p CABG, stage IIIa CKD, OSA on nightly CPAP and polymyalgia rheumatica on chronic steroids, prior L1 fracture, recent frequent falls, sustained another fall on his back, admitted for back pain due to suspected T8 fracture.  IR did not recommend kyphoplasty.  Also treated for acute on chronic diastolic CHF.   Assessment & Plan:  Active Problems:   CAD (coronary artery disease), native coronary artery   Hyperlipidemia   Hypertension   Paroxysmal atrial fibrillation (HCC)   Thoracic ascending aortic aneurysm (HCC)   OSA (obstructive sleep apnea)   Acute on chronic diastolic heart failure (HCC)   T8 vertebral fracture (HCC)   Thrombocytopenia (HCC)   CKD (chronic kidney disease) stage 3, GFR 30-59 ml/min (HCC)   Elevated troponin   Overweight (BMI 25.0-29.9)   Possible T8 vertebral fracture: IR was consulted and indicated that the MRI findings may even be an artifact.  No kyphoplasty indicated.  Continue TLSO brace, multimodality pain control and rehab.  Has SNF bed, awaiting insurance authorization.  Patient did not tolerate Flexeril-became somnolent and was discontinued.  Acute on chronic diastolic CHF: Remains on IV Lasix.  Appears clinically euvolemic today.  Has chronic dyspnea-likely multifactorial including related to his OSA and obesity.  Low procalcitonin, no clinical evidence of pneumonia, antibiotics were discontinued.  Continue Imdur.  Repeat chest x-ray however shows increased bilateral upper lobe predominant interstitial and airspace opacities, concerning for edema.  Probable small left  pleural effusion.  Continue IV Lasix.  Rib cage pain: 2 view chest x-ray showed no evidence of displaced rib fracture with the lower ribs incompletely imaged.  Dedicated rib series can be considered.  Elevated troponin: Suspect due to demand ischemia from decompensated CHF.  Acute respiratory failure with hypoxia: Likely multifactorial related to OSA and decompensated CHF.  Monitor closely, oxygen support and wean as tolerated.  Hypokalemia: Secondary to aggressive IV diuresis.  Replace aggressively and follow.  Acute urinary retention/history of partial prostatectomy: Used to see Dr. Karsten Ro (may have retired).  Multiple in and out catheterizations and eventually had Foley placed last night.  Continue Flomax.  Trial of voiding versus discharge on Foley catheter with outpatient follow-up with urology.  CAD s/p CABG: No anginal symptoms.  Hyperlipidemia: Continue statins.  Essential hypertension: Controlled on amlodipine, hydroxylate, Imdur.  Paroxysmal atrial fibrillation: Rate controlled on amiodarone. Continue Xarelto.  Thoracic ascending aortic aneurysm: Outpatient follow-up.  OSA on CPAP: Continue.  Thrombocytopenia: Improved.  Trend CBC.  CKD stage III a: Creatinine at baseline.  Polymyalgia rheumatica: On chronic steroids which puts him at risk for fractures due to osteoporosis.  Outpatient evaluation with bone density scan.  Chronic anemia, suspect chronic disease: Stable.  Body mass index is 30.51 kg/m.  Obesity   DVT prophylaxis:   Xarelto   Code Status: DNR Family Communication: Discussed in detail with patient's spouse at bedside, updated care and answered all questions. Disposition:  Status is: Inpatient  Remains inpatient appropriate because:IV treatments appropriate due to intensity of illness or inability to take PO  Dispo: The patient is from: Home  Anticipated d/c is to: SNF              Patient currently is medically stable to d/c.   Insurance authorization pending   Difficult to place patient No        Consultants:   Interventional radiology.  Procedures:   TLSO brace Foley catheter insertion.  Antimicrobials:    Anti-infectives (From admission, onward)    Start     Dose/Rate Route Frequency Ordered Stop   08/18/20 1000  azithromycin (ZITHROMAX) tablet 250 mg  Status:  Discontinued       See Hyperspace for full Linked Orders Report.   250 mg Oral Daily 08/17/20 2124 08/19/20 1712   08/17/20 2200  azithromycin (ZITHROMAX) tablet 500 mg       See Hyperspace for full Linked Orders Report.   500 mg Oral Daily 08/17/20 2124 08/17/20 2234   08/17/20 1215  cefoTAXime (CLAFORAN) 1 g in dextrose 5 % 50 mL IVPB  Status:  Discontinued        1 g 120 mL/hr over 30 Minutes Intravenous 2 times daily 08/17/20 1148 08/18/20 1108   08/17/20 1200  metroNIDAZOLE (FLAGYL) tablet 500 mg  Status:  Discontinued        500 mg Oral Every 8 hours 08/17/20 1148 08/17/20 2123         Subjective:  Patient reports bilateral lower anterior rib cage pain, 3/10 in severity.  Did not report back pain.  Dyspnea improved compared to prior but has long history of chronic dyspnea, mainly on exertion.  Last BM 3 days ago.  Not on home oxygen.  Leg swelling has significantly improved  Objective:   Vitals:   08/22/20 0505 08/22/20 0818 08/22/20 0842 08/22/20 1401  BP: 123/77 117/67  112/62  Pulse: 74 88  65  Resp: 16 17  17   Temp: 98.4 F (36.9 C) 98.2 F (36.8 C)  98.3 F (36.8 C)  TempSrc: Oral Oral  Oral  SpO2: (!) 88% 95% 91% 96%  Weight:      Height:        General exam: Elderly male, moderately built and obese sitting up comfortably in bed without distress. Respiratory system: Clear to auscultation. Respiratory effort normal. Cardiovascular system: S1 & S2 heard, RRR. No JVD, murmurs, rubs, gallops or clicks.  Trace bilateral leg edema. Gastrointestinal system: Abdomen is nondistended, soft and nontender. No organomegaly  or masses felt. Normal bowel sounds heard. GU: Has indwelling Foley catheter. Central nervous system: Alert and oriented. No focal neurological deficits. Extremities: Symmetric 5 x 5 power. Skin: No rashes, lesions or ulcers Psychiatry: Judgement and insight appear normal. Mood & affect appropriate.     Data Reviewed:   I have personally reviewed following labs and imaging studies   CBC: Recent Labs  Lab 08/17/20 0912 08/17/20 1323 08/17/20 1932 08/21/20 0311 08/22/20 0747  WBC 5.5 5.2 6.2 6.0 7.3  NEUTROABS 3.7 3.7 4.8  --   --   HGB 9.0* 9.4* 8.6* 8.9* 9.9*  HCT 28.4* 30.2* 28.1* 27.9* 30.1*  MCV 98.3 99.0 100.7* 97.2 95.3  PLT 85* 89* 83* 99* 116*    Basic Metabolic Panel: Recent Labs  Lab 08/17/20 1323 08/18/20 0325 08/20/20 0030 08/21/20 0311 08/22/20 0747  NA  --  137 134* 137 137  K  --  3.2* 3.6 3.3* 3.0*  CL  --  104 100 102 100  CO2  --  24 24 27 26   GLUCOSE  --  119* 102* 95  114*  BUN  --  17 19 20 22   CREATININE  --  1.37* 1.59* 1.56* 1.45*  CALCIUM  --  8.5* 8.4* 8.7* 9.0  MG 2.6* 2.4  --   --   --     Liver Function Tests: No results for input(s): AST, ALT, ALKPHOS, BILITOT, PROT, ALBUMIN in the last 168 hours.  CBG: No results for input(s): GLUCAP in the last 168 hours.  Microbiology Studies:   Recent Results (from the past 240 hour(s))  Resp Panel by RT-PCR (Flu A&B, Covid) Nasopharyngeal Swab     Status: None   Collection Time: 08/17/20 12:10 PM   Specimen: Nasopharyngeal Swab; Nasopharyngeal(NP) swabs in vial transport medium  Result Value Ref Range Status   SARS Coronavirus 2 by RT PCR NEGATIVE NEGATIVE Final    Comment: (NOTE) SARS-CoV-2 target nucleic acids are NOT DETECTED.  The SARS-CoV-2 RNA is generally detectable in upper respiratory specimens during the acute phase of infection. The lowest concentration of SARS-CoV-2 viral copies this assay can detect is 138 copies/mL. A negative result does not preclude  SARS-Cov-2 infection and should not be used as the sole basis for treatment or other patient management decisions. A negative result may occur with  improper specimen collection/handling, submission of specimen other than nasopharyngeal swab, presence of viral mutation(s) within the areas targeted by this assay, and inadequate number of viral copies(<138 copies/mL). A negative result must be combined with clinical observations, patient history, and epidemiological information. The expected result is Negative.  Fact Sheet for Patients:  EntrepreneurPulse.com.au  Fact Sheet for Healthcare Providers:  IncredibleEmployment.be  This test is no t yet approved or cleared by the [place] FDA and  has been authorized for detection and/or diagnosis of SARS-CoV-2 by FDA under an Emergency Use Authorization (EUA). This EUA will remain  in effect (meaning this test can be used) for the duration of the COVID-19 declaration under Section 564(b)(1) of the Act, 21 U.S.C.section 360bbb-3(b)(1), unless the authorization is terminated  or revoked sooner.       Influenza A by PCR NEGATIVE NEGATIVE Final   Influenza B by PCR NEGATIVE NEGATIVE Final    Comment: (NOTE) The Xpert Xpress SARS-CoV-2/FLU/RSV plus assay is intended as an aid in the diagnosis of influenza from Nasopharyngeal swab specimens and should not be used as a sole basis for treatment. Nasal washings and aspirates are unacceptable for Xpert Xpress SARS-CoV-2/FLU/RSV testing.  Fact Sheet for Patients: EntrepreneurPulse.com.au  Fact Sheet for Healthcare Providers: IncredibleEmployment.be  This test is not yet approved or cleared by the [place] FDA and has been authorized for detection and/or diagnosis of SARS-CoV-2 by FDA under an Emergency Use Authorization (EUA). This EUA will remain in effect (meaning this test can be used) for the duration of  the COVID-19 declaration under Section 564(b)(1) of the Act, 21 U.S.C. section 360bbb-3(b)(1), unless the authorization is terminated or revoked.  Performed at [facility], [place] [address], [place], [place] [address]   Culture, blood (routine x 2)     Status: None   Collection Time: 08/17/20  2:50 PM   Specimen: BLOOD LEFT FOREARM  Result Value Ref Range Status   Specimen Description BLOOD LEFT FOREARM  Final   Special Requests   Final    BOTTLES DRAWN AEROBIC AND ANAEROBIC Blood Culture adequate volume   Culture   Final    NO GROWTH 5 DAYS Performed at [facility], [place] [address] [place], [place] [address]  Report Status 08/22/2020 FINAL  Final  Culture, blood (routine x 2)     Status: None   Collection Time: 08/17/20  2:58 PM   Specimen: BLOOD  Result Value Ref Range Status   Specimen Description BLOOD LEFT ANTECUBITAL  Final   Special Requests   Final    BOTTLES DRAWN AEROBIC AND ANAEROBIC Blood Culture adequate volume   Culture   Final    NO GROWTH 5 DAYS Performed at [facility], [address] [place], [place] [address]    Report Status 08/22/2020 FINAL  Final     Radiology Studies:  DG Chest 2 View  Result Date: 08/22/2020 CLINICAL DATA:  Chest pain R07.9 (ICD-10-CM). Fall with lower rib pain. EXAM: CHEST - 2 VIEW COMPARISON:  08/17/2020. FINDINGS: Increased bilateral upper lobe predominant interstitial and airspace opacities. No evidence of a displaced rib fracture with the lower ribs incompletely imaged. No visible pneumothorax. Probable small left pleural effusion. Cardiomediastinal silhouette is within normal limits. CABG and TAVR with median sternotomy. Atrial appendage clip. Known T8 vertebral body fracture better characterized on prior cross-sectional spine imaging. IMPRESSION: 1. Increased bilateral upper lobe predominant interstitial and airspace opacities, concerning for edema and/or multifocal pneumonia. 2. Probable small left pleural  effusion. 3. No evidence of a displaced rib fracture with the lower ribs incompletely imaged. Dedicated rib series could further evaluate if clinically indicated. 4. Known T8 vertebral body fracture better characterized on prior cross-sectional spine imaging. Electronically Signed   By: Margaretha Sheffield M.D.   On: 08/22/2020 12:07     Scheduled Meds:    acetaminophen  1,000 mg Oral TID   amiodarone  200 mg Oral Daily   amLODipine  10 mg Oral Daily   Chlorhexidine Gluconate Cloth  6 each Topical Daily   cholecalciferol  1,000 Units Oral Daily   docusate sodium  100 mg Oral BID   ezetimibe  10 mg Oral Daily   furosemide  40 mg Intravenous BID   gabapentin  200 mg Oral TID   galantamine  8 mg Oral Q breakfast   hydrALAZINE  25 mg Oral BID   isosorbide mononitrate  60 mg Oral QPC supper   leflunomide  10 mg Oral Daily   memantine  10 mg Oral BID   omega-3 acid ethyl esters  1 g Oral Daily   pantoprazole  40 mg Oral Daily   polyethylene glycol  17 g Oral Daily   potassium chloride  40 mEq Oral Q4H   predniSONE  9 mg Oral Q breakfast   psyllium  1 packet Oral Daily   Rivaroxaban  15 mg Oral Daily   sodium chloride flush  3 mL Intravenous Q12H   tamsulosin  0.4 mg Oral QPC supper   vitamin B-12  1,000 mcg Oral Daily    Continuous Infusions:    sodium chloride       LOS: 4 days     Vernell Leep, MD, [place], [facility]. [facility]    To contact the attending provider between 7A-7P or the covering provider during after hours 7P-7A, please log into the web site [email or web address] and access using universal [place] password for that web site. If you do not have the password, please call the hospital operator.  08/22/2020, 4:37 PM

## 2020-08-22 NOTE — TOC Progression Note (Signed)
Transition of Care [facility]) - Progression Note    Patient Details  Name: MARSHAUN LORTIE MRN: [id number] Date of Birth: [birth year]  [facility]) CM/SW Contact  Milinda Antis, [employer] Phone Number: 08/22/2020, 12:00 PM  Clinical Narrative:    CSW received notification that the patient's facility of choice, [facility], is unable to extend a bed offer.  CSW received notification that [facility] is presenting a bed offer to the patient.  CSW contacted the patient and spouse to discuss the above information.  The family is agreeable to [facility].  CSW informed the facility that the family is accepting the bed offer and updated the insurance pre authorization with facility choice.  Pending: insurance auth.   Expected Discharge Plan: Skilled Nursing Facility Barriers to Discharge: Ship broker, SNF Pending bed offer  Expected Discharge Plan and Services Expected Discharge Plan: [place] arrangements for the past 2 months: Single Family Home                                       Social Determinants of Health (SDOH) Interventions    Readmission Risk Interventions No flowsheet data found.

## 2020-08-22 NOTE — Plan of Care (Signed)

## 2020-08-23 DIAGNOSIS — G309 Alzheimer's disease, unspecified: Secondary | ICD-10-CM | POA: Diagnosis not present

## 2020-08-23 DIAGNOSIS — M353 Polymyalgia rheumatica: Secondary | ICD-10-CM | POA: Diagnosis not present

## 2020-08-23 DIAGNOSIS — R778 Other specified abnormalities of plasma proteins: Secondary | ICD-10-CM | POA: Diagnosis not present

## 2020-08-23 DIAGNOSIS — S22060B Wedge compression fracture of T7-T8 vertebra, initial encounter for open fracture: Secondary | ICD-10-CM | POA: Diagnosis not present

## 2020-08-23 DIAGNOSIS — D0359 Melanoma in situ of other part of trunk: Secondary | ICD-10-CM | POA: Diagnosis not present

## 2020-08-23 DIAGNOSIS — I259 Chronic ischemic heart disease, unspecified: Secondary | ICD-10-CM | POA: Diagnosis not present

## 2020-08-23 DIAGNOSIS — L905 Scar conditions and fibrosis of skin: Secondary | ICD-10-CM | POA: Diagnosis not present

## 2020-08-23 DIAGNOSIS — M549 Dorsalgia, unspecified: Secondary | ICD-10-CM | POA: Diagnosis not present

## 2020-08-23 DIAGNOSIS — N400 Enlarged prostate without lower urinary tract symptoms: Secondary | ICD-10-CM | POA: Diagnosis not present

## 2020-08-23 DIAGNOSIS — R531 Weakness: Secondary | ICD-10-CM | POA: Diagnosis not present

## 2020-08-23 DIAGNOSIS — I2510 Atherosclerotic heart disease of native coronary artery without angina pectoris: Secondary | ICD-10-CM | POA: Diagnosis not present

## 2020-08-23 DIAGNOSIS — I5033 Acute on chronic diastolic (congestive) heart failure: Secondary | ICD-10-CM | POA: Diagnosis not present

## 2020-08-23 DIAGNOSIS — N183 Chronic kidney disease, stage 3 unspecified: Secondary | ICD-10-CM | POA: Diagnosis not present

## 2020-08-23 DIAGNOSIS — D696 Thrombocytopenia, unspecified: Secondary | ICD-10-CM | POA: Diagnosis not present

## 2020-08-23 DIAGNOSIS — J189 Pneumonia, unspecified organism: Secondary | ICD-10-CM | POA: Diagnosis not present

## 2020-08-23 DIAGNOSIS — I10 Essential (primary) hypertension: Secondary | ICD-10-CM

## 2020-08-23 DIAGNOSIS — E785 Hyperlipidemia, unspecified: Secondary | ICD-10-CM | POA: Diagnosis not present

## 2020-08-23 DIAGNOSIS — S22060D Wedge compression fracture of T7-T8 vertebra, subsequent encounter for fracture with routine healing: Secondary | ICD-10-CM | POA: Diagnosis not present

## 2020-08-23 DIAGNOSIS — G20 Parkinson's disease: Secondary | ICD-10-CM | POA: Diagnosis not present

## 2020-08-23 DIAGNOSIS — G4733 Obstructive sleep apnea (adult) (pediatric): Secondary | ICD-10-CM | POA: Diagnosis not present

## 2020-08-23 DIAGNOSIS — I480 Paroxysmal atrial fibrillation: Secondary | ICD-10-CM | POA: Diagnosis not present

## 2020-08-23 DIAGNOSIS — G4700 Insomnia, unspecified: Secondary | ICD-10-CM | POA: Diagnosis not present

## 2020-08-23 DIAGNOSIS — J9601 Acute respiratory failure with hypoxia: Secondary | ICD-10-CM | POA: Diagnosis not present

## 2020-08-23 DIAGNOSIS — E876 Hypokalemia: Secondary | ICD-10-CM | POA: Diagnosis not present

## 2020-08-23 DIAGNOSIS — I5032 Chronic diastolic (congestive) heart failure: Secondary | ICD-10-CM | POA: Diagnosis not present

## 2020-08-23 DIAGNOSIS — Z7401 Bed confinement status: Secondary | ICD-10-CM | POA: Diagnosis not present

## 2020-08-23 DIAGNOSIS — I712 Thoracic aortic aneurysm, without rupture: Secondary | ICD-10-CM | POA: Diagnosis not present

## 2020-08-23 DIAGNOSIS — R262 Difficulty in walking, not elsewhere classified: Secondary | ICD-10-CM | POA: Diagnosis not present

## 2020-08-23 LAB — COMPREHENSIVE METABOLIC PANEL
ALT: 29 U/L (ref 0–44)
AST: 28 U/L (ref 15–41)
Albumin: 2.3 g/dL — ABNORMAL LOW (ref 3.5–5.0)
Alkaline Phosphatase: 75 U/L (ref 38–126)
Anion gap: 9 (ref 5–15)
BUN: 24 mg/dL — ABNORMAL HIGH (ref 8–23)
CO2: 26 mmol/L (ref 22–32)
Calcium: 8.9 mg/dL (ref 8.9–10.3)
Chloride: 102 mmol/L (ref 98–111)
Creatinine, Ser: 1.49 mg/dL — ABNORMAL HIGH (ref 0.61–1.24)
GFR, Estimated: 44 mL/min — ABNORMAL LOW (ref >60)
Glucose, Bld: 102 mg/dL — ABNORMAL HIGH (ref 70–99)
Potassium: 3.9 mmol/L (ref 3.5–5.1)
Sodium: 137 mmol/L (ref 135–145)
Total Bilirubin: 0.7 mg/dL (ref 0.3–1.2)
Total Protein: 5.4 g/dL — ABNORMAL LOW (ref 6.5–8.1)

## 2020-08-23 LAB — CBC
HCT: 26.1 % — ABNORMAL LOW (ref 39.0–52.0)
Hemoglobin: 8.5 g/dL — ABNORMAL LOW (ref 13.0–17.0)
MCH: 31 pg (ref 26.0–34.0)
MCHC: 32.6 g/dL (ref 30.0–36.0)
MCV: 95.3 fL (ref 80.0–100.0)
Platelets: 114 10*3/uL — ABNORMAL LOW (ref 150–400)
RBC: 2.74 MIL/uL — ABNORMAL LOW (ref 4.22–5.81)
RDW: 15.3 % (ref 11.5–15.5)
WBC: 6.1 10*3/uL (ref 4.0–10.5)
nRBC: 0 % (ref 0.0–0.2)

## 2020-08-23 LAB — BRAIN NATRIURETIC PEPTIDE: B Natriuretic Peptide: 199.4 pg/mL — ABNORMAL HIGH (ref 0.0–100.0)

## 2020-08-23 LAB — MAGNESIUM: Magnesium: 2.1 mg/dL (ref 1.7–2.4)

## 2020-08-23 LAB — RESP PANEL BY RT-PCR (FLU A&B, COVID) ARPGX2
Influenza A by PCR: NEGATIVE
Influenza B by PCR: NEGATIVE
SARS Coronavirus 2 by RT PCR: NEGATIVE

## 2020-08-23 MED ORDER — TAMSULOSIN HCL 0.4 MG PO CAPS: 0.4000 mg | ORAL_CAPSULE | Freq: Every day | ORAL | Status: DC

## 2020-08-23 MED ORDER — POLYETHYLENE GLYCOL 3350 17 G PO PACK: 17.0000 g | PACK | Freq: Two times a day (BID) | ORAL | Status: DC

## 2020-08-23 MED ORDER — OXYCODONE HCL 5 MG PO TABS: 2.5000 mg | ORAL_TABLET | Freq: Two times a day (BID) | ORAL | 0 refills | Status: DC | PRN

## 2020-08-23 MED ORDER — DOCUSATE SODIUM 100 MG PO CAPS
100.0000 mg | ORAL_CAPSULE | Freq: Two times a day (BID) | ORAL | Status: DC
Start: 2020-08-23 — End: 2021-11-24

## 2020-08-23 MED ORDER — ACETAMINOPHEN 500 MG PO TABS: 1000.0000 mg | ORAL_TABLET | Freq: Three times a day (TID) | ORAL | Status: DC

## 2020-08-23 MED ORDER — METOPROLOL SUCCINATE ER 25 MG PO TB24: 25.0000 mg | ORAL_TABLET | Freq: Every day | ORAL | Status: DC | PRN

## 2020-08-23 NOTE — TOC Transition Note (Signed)
Transition of Care [facility]) - CM/SW Discharge Note   Patient Details  Name: [name] [name] MRN: [id number] Date of Birth: 19-Feb-1926  [facility] Care [facility]) CM/SW Contact:  Milinda Antis, [place] Phone Number: 08/23/2020, 2:15 PM   Clinical Narrative:    Patient will DC to: [facility]  Anticipated DC date: 08/23/2020 Family notified: Yes Transport by: Corey Harold   Per MD patient ready for DC to SNF. RN to call report prior to discharge [phone] and speak with Guerry Minors. RN, patient, patient's family, and facility notified of DC. Discharge Summary and FL2 sent to facility. DC packet on chart. Ambulance transport requested for patient.   CSW will sign off for now as social work intervention is no longer needed. Please consult [place] again if new needs arise.     Final next level of care: Skilled Nursing Facility Barriers to Discharge: Barriers Resolved   Patient Goals and CMS Choice Patient states their goals for this hospitalization and ongoing recovery are:: being able to go to his grand daughter's wedding CMS Medicare.gov Compare Post Acute Care list provided to:: Patient Choice offered to / list presented to : Patient  Discharge Placement              Patient chooses bed at:  [facility]) Patient to be transferred to facility by: [place] Name of family member notified: patient alert Patient and family notified of of transfer: 08/23/20  Discharge Plan and Services                                     Social Determinants of Health (SDOH) Interventions     Readmission Risk Interventions No flowsheet data found.

## 2020-08-23 NOTE — Plan of Care (Signed)
  Problem: Activity: Goal: Risk for activity intolerance will decrease Outcome: Progressing   Problem: Elimination: Goal: Will not experience complications related to urinary retention Outcome: Not Progressing   Problem: Pain Managment: Goal: General experience of comfort will improve Outcome: Progressing

## 2020-08-23 NOTE — Progress Notes (Signed)
Occupational Therapy Treatment Patient Details Name: [name] [name] MRN: [id number] DOB: 1926/08/27 Today's Date: 08/23/2020    History of present illness Pt is a 87 y/o male admitted 08/17/20 following a fall. Found to have T8 fx to be managed conservatively.  IR consulted and do not believe MRI to be conclusive for T8 fx, no kyphopasty needed at this time.  Continue to tx conservatively with bracing. PMH includes CAD s/p CABG, dCHF, HTN, a fib, CKD, s/p TAVR.   OT comments  [name] is progressing well with plans for d/c today. He demonstrated great ability to log roll and adhere to back precautions throughout ADL session. He was min guard for bed mobility, and up to mod A for ADLs. He continues to benefit from acute OT. D/c plan remains appropriate.    Follow Up Recommendations  SNF    Equipment Recommendations  None recommended by OT       Precautions / Restrictions Precautions Precautions: Back Precaution Booklet Issued: No Precaution Comments: reinforced back precautions Required Braces or Orthoses: Spinal Brace Spinal Brace: Thoracolumbosacral orthotic Restrictions Weight Bearing Restrictions: No       Mobility Bed Mobility Overal bed mobility: Needs Assistance Bed Mobility: Rolling;Sidelying to Sit Rolling: Min guard Sidelying to sit: Min guard;HOB elevated       General bed mobility comments: incrased time and heavy use of bed rail    Transfers Overall transfer level: Needs assistance Equipment used: Rolling walker (2 wheeled) Transfers: Sit to/from [facility] Sit to Stand: Min assist;From elevated surface Stand pivot transfers: Min guard            Balance Overall balance assessment: Needs assistance Sitting-balance support: Feet supported Sitting balance-Leahy Scale: Fair               ADL either performed or assessed with clinical judgement   ADL Overall ADL's : Needs assistance/impaired                 Upper Body  Dressing : Set up;Sitting Upper Body Dressing Details (indicate cue type and reason): total A for TLSO Lower Body Dressing: Moderate assistance;Sit to/from stand Lower Body Dressing Details (indicate cue type and reason): A for threading BLE, and donning over hips             Functional mobility during ADLs: Minimal assistance;Rolling walker General ADL Comments: ADL session focused on compensatory techniques      Cognition Arousal/Alertness: Awake/alert Behavior During Therapy: WFL for tasks assessed/performed Overall Cognitive Status: No family/caregiver present to determine baseline cognitive functioning               General Comments: recalled 3/3 back precuations demonstrated log rolling              General Comments VSS on RA, RN removed IV this session, pt with bleeding at IV site.    Pertinent Vitals/ Pain       Pain Assessment: Faces Faces Pain Scale: Hurts little more Pain Location: back and ribs Pain Descriptors / Indicators: Grimacing Pain Intervention(s): Limited activity within patient's tolerance;Monitored during session   Frequency  Min 2X/week        Progress Toward Goals  OT Goals(current goals can now be found in the care plan section)  Progress towards OT goals: Progressing toward goals  Acute Rehab OT Goals Patient Stated Goal: to be able to take care of self again OT Goal Formulation: With patient Time For Goal Achievement: 09/02/20 Potential to Achieve Goals: Good ADL  Goals Pt Will Perform Grooming: with min assist;standing Pt Will Perform Upper Body Bathing: with set-up;sitting Pt Will Perform Lower Body Bathing: with min assist;sit to/from stand;with adaptive equipment Pt Will Perform Upper Body Dressing: with set-up;sitting Pt Will Perform Lower Body Dressing: with min assist;sit to/from stand;with adaptive equipment Pt Will Transfer to Toilet: with min assist;ambulating;regular height toilet;bedside commode;grab bars Pt Will  Perform Toileting - Clothing Manipulation and hygiene: with min assist;sit to/from stand  Plan Discharge plan remains appropriate       AM-PAC OT "6 Clicks" Daily Activity     Outcome Measure   Help from another person eating meals?: A Little Help from another person taking care of personal grooming?: A Little Help from another person toileting, which includes using toliet, bedpan, or urinal?: A Lot Help from another person bathing (including washing, rinsing, drying)?: A Lot Help from another person to put on and taking off regular upper body clothing?: A Lot Help from another person to put on and taking off regular lower body clothing?: A Lot 6 Click Score: 14    End of Session Equipment Utilized During Treatment: Rolling walker;Back brace  OT Visit Diagnosis: Unsteadiness on feet (R26.81)   Activity Tolerance Patient tolerated treatment well   Patient Left Other (comment) (on stretcher for discharge)   Nurse Communication Mobility status        Time: 1525-1540 OT Time Calculation (min): 15 min  Charges: OT General Charges $OT Visit: 1 Visit OT Treatments $Self Care/Home Management : 8-22 mins   Brecklyn Galvis A Kynan Peasley 08/23/2020, 3:47 PM

## 2020-08-23 NOTE — Progress Notes (Signed)
RN called report to [facility], all questions answered. Pt dressed and belongings gathered to be sent with him. Cellphone, charger and pillow in pts personal bag. Foley catheter in place. Wife called and updated of transfer. Pt in no distress at discharge.

## 2020-08-23 NOTE — Discharge Instructions (Signed)

## 2020-08-23 NOTE — Progress Notes (Signed)
RT placed patient on CPAP with 4L O2 bled into circuit. Patient tolerating settings well at this time. RT will monitor as needed. 

## 2020-08-23 NOTE — Discharge Summary (Addendum)
Physician Discharge Summary  [name] [name] FKC:[id number] DOB: 1926-07-13  PCP: [name], MD  Admitted from: Home Discharged to: SNF  Admit date: 08/17/2020 Discharge date: 08/23/2020  Recommendations for Outpatient Follow-up:    Follow-up Information     MD at SNF. Schedule an appointment as soon as possible for a visit.   Why: To be seen in 2 to 3 days with repeat labs (CBC, CMP & magnesium).  Recommend outpatient Urology consultation for evaluation and management of recurrent acute urinary retention.        [facility]. Schedule an appointment as soon as possible for a visit in 2 week(s).   Contact information: [address] [place]        [name], MD. Schedule an appointment as soon as possible for a visit.   Specialty: Internal Medicine Why: To be seen upon discharge from SNF Contact information: [place] [address] [phone]         Alethia Berthold, [place]. Schedule an appointment as soon as possible for a visit in 2 week(s).   Specialty: Cardiology Contact information: [place] [address] [phone]                  Home Health: None    Equipment/Devices: TBD at [facility]    Discharge Condition: Improved and stable   Code Status: DNR Diet recommendation:  Discharge Diet Orders (From admission, onward)     Start     Ordered   08/23/20 0000  Diet - low sodium heart healthy        08/23/20 1148             Discharge Diagnoses:  Active Problems:   CAD (coronary artery disease), native coronary artery   Hyperlipidemia   Hypertension   Paroxysmal atrial fibrillation (HCC)   Thoracic ascending aortic aneurysm (HCC)   OSA (obstructive sleep apnea)   Acute on chronic diastolic heart failure (HCC)   T8 vertebral fracture (HCC)   Thrombocytopenia (HCC)   CKD (chronic kidney disease) stage 3, GFR 30-59 ml/min (HCC)    Elevated troponin   Overweight (BMI 25.0-29.9)   Brief Summary: 87 year old married male with PMH of paroxysmal A. fib on Xarelto, severe aortic stenosis s/p TAVR, AAA, chronic diastolic CHF, CAD s/p CABG, stage IIIa CKD, OSA on nightly CPAP and polymyalgia rheumatica on chronic steroids, prior L1 fracture, recent frequent falls, dementia, sustained another fall on his back, admitted for back pain due to suspected T8 fracture.  IR did not recommend kyphoplasty.  Also treated for acute on chronic diastolic CHF.   Assessment & Plan:   Possible T8 vertebral fracture: IR was consulted and indicated that the MRI findings may even be an artifact.  No kyphoplasty indicated.  Continue TLSO brace, multimodality pain control and rehab.  Patient did not tolerate Flexeril-became somnolent and was discontinued.  Pain mostly controlled on scheduled Tylenol.  Has only required occasional very low-dose Oxy IR.  Upon review of [place] PDMP, has not had opioids filled since March 2022.  Acute on chronic diastolic CHF: Treated for a couple of days with IV Lasix 40 mg twice daily.  Has chronic dyspnea-likely multifactorial including related to his OSA and obesity.  Low procalcitonin, no clinical evidence of pneumonia, antibiotics were discontinued.  Continue Imdur.  Repeat chest x-ray 8/22 however showed increased bilateral upper lobe predominant interstitial and airspace opacities, concerning for edema.  Probable small left pleural effusion.  Thereby Lasix was continued for additional day.  Asymptomatic of respiratory symptoms.  Clinically appears compensated.  IV Lasix transitioned to prior home dose of oral Lasix.  Weight recording does not seem to be accurate.  -12.5 L since admission.  Outpatient follow-up with primary cardiologist.  Rib cage pain: 2 view chest x-ray showed no evidence of displaced rib fracture with the lower ribs incompletely imaged.  Dedicated rib series can be considered if he has recurrence or  worsening pain.  Did not report pain today.  Elevated troponin: Suspect due to demand ischemia from decompensated CHF.  Outpatient follow-up with cardiology.  Acute respiratory failure with hypoxia: Likely multifactorial related to OSA and decompensated CHF.  Weaned off oxygen, saturating in the low 90s on room air.  Hypokalemia: Secondary to aggressive IV diuresis.  Replaced.  Continue low-dose potassium supplements that he was on prior to admission.  Follow BMP periodically.  Acute urinary retention/history of partial prostatectomy: Used to see Dr. Karsten Ro (may have retired).  Multiple in and out catheterizations and eventually had Foley placed last night.  Continue Flomax.  Failed attempts to DC Foley catheter today, failed voiding trials and hence Foley catheter was placed back and will be discharged with same.  CAD s/p CABG: No anginal symptoms.  Hyperlipidemia: Continue statins, Zetia and omega-3.  Essential hypertension: Controlled on amlodipine, hydralazine and, Imdur.  Paroxysmal atrial fibrillation: Rate controlled on amiodarone. Continue Xarelto.  Thoracic ascending aortic aneurysm: CT lumbar spine also showed extensive aortic atherosclerosis with multifocal aneurysmal dilatation and short segment dissections, mildly progressed from CTA 10 months prior.  Radiology recommends considering follow-up CT as clinically warranted.  Outpatient follow-up.  OSA on CPAP: Continue.  Thrombocytopenia: Improved/stable.  Trend CBC.  CKD stage III a: Creatinine at baseline.  Polymyalgia rheumatica: On chronic steroids which puts him at risk for fractures due to osteoporosis.  Outpatient evaluation with bone density scan.  Continue Prolia, vitamin D supplements etc.  Chronic anemia, suspect chronic disease: Stable.  Body mass index is 30.51 kg/m.  Obesity  Wound management as per [facility] RN consult as follows:     Signed      WOC Nurse Consult Note: Patient receiving care in [place] Reason for Consult: blister right heel Wound type: Partial thickness wound to the right heel. Measures approx. 1 x 1 and is pink and dry. BLE are dry and flaky Pressure Injury POA: NA Drainage (amount, consistency, odor) None Periwound: Dry intact Dressing procedure/placement/frequency: Place heel foam dressing Kellie Simmering # [phone]) on bilateral heels. Change every 3 days. Pull back foam dressing and assess heels each shift and document findings.    Pressure Injury Prevention Bundle May use any that apply to this patient Support surfaces (air mattress) chair cushion Kellie Simmering # [phone]) Heel offloading boots Kellie Simmering # [phone]) Turning and Positioning  Measures to reduce shear (draw sheet, knees up) Skin protection Products (Foam dressing) Moisture management products (Critic-Aid Barrier Cream (Purple top) Nutrition Management Protection for Medical Devices Routine Skin Assessment    Monitor the wound area(s) for worsening of condition such as: Signs/symptoms of infection, increase in size, development of or worsening of odor, development of pain, or increased pain at the affected locations.   Notify the medical team if any of these develop.                  Consultants:   Interventional radiology.  Procedures:   TLSO brace Foley catheter insertion.   Discharge  Instructions  Discharge Instructions     (HEART FAILURE PATIENTS) Call MD:  Anytime you have any of the following symptoms: 1) 3 pound weight gain in 24 hours or 5 pounds in 1 week 2) shortness of breath, with or without a dry hacking cough 3) swelling in the hands, feet or stomach 4) if you have to sleep on extra pillows at night in order to breathe.   Complete by: As directed    Call MD for:  difficulty breathing, headache or visual disturbances   Complete by: As directed    Call MD for:  extreme fatigue   Complete by: As directed    Call MD for:  persistant dizziness or light-headedness   Complete by: As  directed    Call MD for:  persistant nausea and vomiting   Complete by: As directed    Call MD for:  severe uncontrolled pain   Complete by: As directed    Call MD for:  temperature >100.4   Complete by: As directed    Diet - low sodium heart healthy   Complete by: As directed    Discharge instructions   Complete by: As directed    CPAP nightly at bedtime.  TLSO brace when up and about.   Discharge wound care:   Complete by: As directed    Place heel foam dressing Kellie Simmering [phone]) on bilateral heels.  Change every 3 days.  Pull back foam dressing and assess heels each shift and document findings.  Apply Sween moisturizing lotion (Pink top and clean supply) to bilateral lower extremity daily.   Increase activity slowly   Complete by: As directed         Medication List     STOP taking these medications    cyclobenzaprine 10 MG tablet Commonly known as: FLEXERIL       TAKE these medications    acetaminophen 500 MG tablet Commonly known as: TYLENOL Take 2 tablets (1,000 mg total) by mouth 3 (three) times daily. For 3 to 5 days and then change to 1000 mg 3 times daily as needed for pain. What changed:  how much to take when to take this additional instructions   amiodarone 200 MG tablet Commonly known as: PACERONE Take 1 tablet (200 mg total) by mouth daily.   amLODipine 10 MG tablet Commonly known as: NORVASC Take 1 tablet (10 mg total) by mouth daily.   cholecalciferol 25 MCG (1000 UNIT) tablet Commonly known as: VITAMIN D3 Take 1,000 Units by mouth daily.   denosumab 60 MG/ML Sosy injection Commonly known as: PROLIA Inject 60 mg into the skin every 6 (six) months.   docusate sodium 100 MG capsule Commonly known as: COLACE Take 1 capsule (100 mg total) by mouth 2 (two) times daily.   EPINEPHrine 0.3 mg/0.3 mL Soaj injection Commonly known as: EPI-PEN Inject 0.3 mLs (0.3 mg total) into the muscle as needed for anaphylaxis. What changed: when to take  this   ezetimibe 10 MG tablet Commonly known as: ZETIA Take 10 mg by mouth daily.   fish oil-omega-3 fatty acids 1000 MG capsule Take 1 g by mouth daily.   fluticasone 50 MCG/ACT nasal spray Commonly known as: FLONASE Place 1 spray into both nostrils daily as needed for allergies.   furosemide 40 MG tablet Commonly known as: LASIX TAKE 1 TABLET(40 MG) BY MOUTH TWICE DAILY AT 10 AM AND AT 4 PM What changed: See the new instructions.   gabapentin 100 MG capsule Commonly known  as: NEURONTIN Take 100 mg by mouth 3 (three) times daily.   galantamine 8 MG 24 hr capsule Commonly known as: RAZADYNE ER Take 8 mg by mouth Daily.   HONEY BEE VENOM IJ Inject 2 each as directed See admin instructions. Every eight weeks   hydrALAZINE 25 MG tablet Commonly known as: APRESOLINE Take 25 mg by mouth 2 (two) times daily.   isosorbide mononitrate 60 MG 24 hr tablet Commonly known as: IMDUR TAKE 1 TABLET(60 MG) BY MOUTH DAILY AFTER SUPPER What changed: See the new instructions.   leflunomide 20 MG tablet Commonly known as: ARAVA Take 10 mg by mouth daily.   magnesium gluconate 500 MG tablet Commonly known as: MAGONATE Take 500 mg by mouth daily.   memantine 10 MG tablet Commonly known as: NAMENDA Take 10 mg by mouth 2 (two) times daily.   metoprolol succinate 25 MG 24 hr tablet Commonly known as: TOPROL-XL Take 1 tablet (25 mg total) by mouth daily as needed (for high heart rate, consistently greater than 110/min.). What changed: reasons to take this   nitroGLYCERIN 0.4 MG SL tablet Commonly known as: Nitrostat Place 1 tablet (0.4 mg total) under the tongue every 5 (five) minutes x 3 doses as needed for chest pain.   oxyCODONE 5 MG immediate release tablet Commonly known as: Oxy IR/ROXICODONE Take 0.5 tablets (2.5 mg total) by mouth every 12 (twelve) hours as needed for breakthrough pain.   pantoprazole 40 MG tablet Commonly known as: PROTONIX Take 40 mg by mouth every  other day.   polyethylene glycol 17 g packet Commonly known as: MIRALAX / GLYCOLAX Take 17 g by mouth 2 (two) times daily.   potassium chloride 10 MEQ CR capsule Commonly known as: MICRO-K TAKE ONE CAPSULE BY MOUTH DAILY WITH LASIX What changed: See the new instructions.   predniSONE 1 MG tablet Commonly known as: DELTASONE Take 9 mg by mouth daily with breakfast.   PRESCRIPTION MEDICATION Cpap   Psyllium Husk Powd Take 1 Scoop by mouth daily.   Rivaroxaban 15 MG Tabs tablet Commonly known as: XARELTO Take 15 mg by mouth daily with supper.   tamsulosin 0.4 MG Caps capsule Commonly known as: FLOMAX Take 1 capsule (0.4 mg total) by mouth daily after supper.   triamcinolone cream 0.1 % Commonly known as: KENALOG Apply 1 application topically daily as needed (skin irritation).   vitamin B-12 1000 MCG tablet Commonly known as: CYANOCOBALAMIN Take 1,000 mcg by mouth daily.       Allergies  Allergen Reactions   Amoxicillin Other (See Comments)    Headache, debilitated   Bee Venom Anaphylaxis   Avelox [Moxifloxacin]     Other reaction(s): Unknown   Duloxetine Hcl     Other reaction(s): felt weird   Levitra [Vardenafil]     Other reaction(s): Unknown   Morphine And Related Other (See Comments)    hypotension   Testosterone Other (See Comments)    unknown   Viagra [Sildenafil]     Other reaction(s): didn't like it      Procedures/Studies: DG Chest 2 View  Result Date: 08/22/2020 CLINICAL DATA:  Chest pain R07.9 (ICD-10-CM). Fall with lower rib pain. EXAM: CHEST - 2 VIEW COMPARISON:  08/17/2020. FINDINGS: Increased bilateral upper lobe predominant interstitial and airspace opacities. No evidence of a displaced rib fracture with the lower ribs incompletely imaged. No visible pneumothorax. Probable small left pleural effusion. Cardiomediastinal silhouette is within normal limits. CABG and TAVR with median sternotomy. Atrial appendage clip. Known  T8 vertebral body  fracture better characterized on prior cross-sectional spine imaging. IMPRESSION: 1. Increased bilateral upper lobe predominant interstitial and airspace opacities, concerning for edema and/or multifocal pneumonia. 2. Probable small left pleural effusion. 3. No evidence of a displaced rib fracture with the lower ribs incompletely imaged. Dedicated rib series could further evaluate if clinically indicated. 4. Known T8 vertebral body fracture better characterized on prior cross-sectional spine imaging. Electronically Signed   By: Margaretha Sheffield M.D.   On: 08/22/2020 12:07   CT HEAD WO CONTRAST (5MM)  Result Date: 08/17/2020 CLINICAL DATA:  Head trauma.  Fall yesterday. EXAM: CT HEAD WITHOUT CONTRAST TECHNIQUE: Contiguous axial images were obtained from the base of the skull through the vertex without intravenous contrast. COMPARISON:  06/28/2017 FINDINGS: Brain: No evidence of acute infarction, hemorrhage, hydrocephalus, extra-axial collection or mass lesion/mass effect. Prominence of the sulci and ventricles compatible with brain atrophy. Vascular: No hyperdense vessel or unexpected calcification. Skull: Normal. Negative for fracture or focal lesion. Sinuses/Orbits: No acute finding. Other: None IMPRESSION: 1. No acute intracranial abnormalities. 2. Prominence of the sulci and ventricles compatible with age related brain atrophy. Electronically Signed   By: Kerby Moors M.D.   On: 08/17/2020 11:02   CT Chest Wo Contrast  Result Date: 08/17/2020 CLINICAL DATA:  Midthoracic pain and back pain after falling yesterday. Evaluate for spinal fracture and/or aspiration pneumonia. On blood thinners. EXAM: CT CHEST WITHOUT CONTRAST TECHNIQUE: Multidetector CT imaging of the chest was performed following the standard protocol without IV contrast. COMPARISON:  Chest CTA 10/13/2019.  Radiographs 08/17/2020. FINDINGS: Cardiovascular: Diffuse atherosclerosis of the aorta, great vessels and coronary arteries status post  median sternotomy, CABG and TAVR. No acute vascular findings on noncontrast imaging. No evidence mediastinal hematoma. The heart size is normal. There is no pericardial effusion. Mediastinum/Nodes: There are no enlarged mediastinal, hilar or axillary lymph nodes.Small mediastinal lymph nodes are stable. The thyroid gland, trachea and esophagus demonstrate no significant findings. Lungs/Pleura: There are small to moderate dependent bilateral pleural effusions with associated patchy dependent pulmonary opacities bilaterally. In addition, there are non dependent airspace opacities at both lung apices with additional scattered ground-glass opacities in both lungs. There is mild diffuse central airway thickening. No endobronchial lesion or suspicious pulmonary nodule. No pneumothorax. Upper abdomen: No acute findings within the visualized upper abdomen. Musculoskeletal/Chest wall: Status post median sternotomy. No evidence of acute rib fracture. Spinal findings are dictated separately; there is a nondisplaced horizontal fracture through the T8 vertebral body. No evidence of chest wall mass or hematoma. IMPRESSION: 1. Bilateral pleural effusions with multifocal patchy airspace and ground-glass opacities in both lungs, likely indicative of pulmonary edema or multilobar pneumonia. Pattern is atypical for aspiration. 2. No acute vascular findings on noncontrast imaging. No evidence of mediastinal hematoma. 3. Extensive coronary and aortic atherosclerosis post CABG and TAVR. 4. Nondisplaced horizontal fracture through the T8 vertebral body; spinal details dictated separately. No acute osseous findings evident. Electronically Signed   By: Richardean Sale M.D.   On: 08/17/2020 11:18   CT Lumbar Spine Wo Contrast  Result Date: 08/17/2020 CLINICAL DATA:  Fall yesterday. Back pain and shortness of breath. On blood thinners. EXAM: CT THORACIC AND LUMBAR SPINE WITHOUT CONTRAST TECHNIQUE: Multidetector CT imaging of the thoracic  and lumbar spine was performed without contrast. Multiplanar CT image reconstructions were also generated. COMPARISON:  Chest CT same date. Lumbar MRI 01/08/2020. CTA of the chest and abdomen 10/13/2019. FINDINGS: CT THORACIC SPINE FINDINGS Alignment: Normal. Vertebrae: The bones are  demineralized. There is diffuse ankylosis of the thoracic spine. There is high suspicion of a nondisplaced nearly horizontal fracture through the T8 vertebral body, best seen on the reformatted images. No clear extension to the posterior elements identified, although there is questionable involvement of the posterior elements bilaterally at T9. No displaced fractures or traumatic subluxation identified. Paraspinal and other soft tissues: No acute paraspinal findings. Bilateral pleural effusions and bilateral pulmonary opacities, further described on chest CT. Disc levels: No large disc herniation, thoracic spinal stenosis or significant foraminal compromise. CT LUMBAR SPINE FINDINGS Segmentation: There are 5 lumbar type vertebral bodies. Alignment: Normal. Vertebrae: Patient has undergone spinal augmentation at L1 (on 02/23/2020). Underlying fracture results in approximately 50% loss of vertebral body height. There is no associated osseous retropulsion. No evidence of acute lumbar spine fracture. Paraspinal and other soft tissues: No acute paraspinal findings. Diffuse aortic and branch vessel atherosclerosis. Multifocal aneurysmal dilatation of the abdominal aorta with multiple saccular components and short segment dissections. The aorta measures up to 4.4 cm AP on image 78/4. This compares with 4.0 cm previously. No evidence of retroperitoneal hematoma. The bladder is mildly distended. Disc levels: Mild superimposed multilevel spondylosis with disc space narrowing and endplate osteophytes greatest at L5-S1. Mild facet degenerative changes. No high-grade spinal stenosis. IMPRESSION: CT THORACIC SPINE IMPRESSION 1. Diffuse ankylosis of  the thoracic spine. 2. Nondisplaced nearly horizontal fracture through the T8 vertebral body with potential posterior extension into the posterior elements at T9. Given the patient's underlying rigid spine, this may reflect an unstable fracture. Consider further evaluation with thoracic MRI. 3. No evidence of thoracic paraspinal hematoma. CT LUMBAR SPINE IMPRESSION 1. No acute lumbar spine findings. 2. Recent spinal augmentation at L1. 3. Extensive aortic atherosclerosis with multifocal aneurysmal dilatation and short segment dissection, mildly progressed from CTA 10 months ago. Consider follow-up CTA as clinically warranted. 4. These results were called by telephone at the time of interpretation on 08/17/2020 at 11:16 am to provider [facility] , who verbally acknowledged these results. Electronically Signed   By: Richardean Sale M.D.   On: 08/17/2020 11:17   MR THORACIC SPINE WO CONTRAST  Result Date: 08/17/2020 CLINICAL DATA:  Thoracic spine fracture noted on CT EXAM: MRI THORACIC SPINE WITHOUT CONTRAST TECHNIQUE: Multiplanar, multisequence MR imaging of the thoracic spine was performed. No intravenous contrast was administered. COMPARISON:  CT thoracic spine obtained earlier the same day FINDINGS: Alignment: There is trace anterolisthesis of T1 on T2. Alignment is otherwise normal. There is no evidence of traumatic malalignment Vertebrae: There is linear T1 hypointensity extending posteriorly from the anterior endplate of the T8 vertebral body. The fracture plane extends superiorly to involve the superior endplate. There is mild associated STIR hyperintensity. There is no loss of vertebral body height. There is no bony retropulsion. There is no epidural hematoma. There is no definite involvement of the posterior elements. There is no evidence of disruption of the anterior or posterior longitudinal ligaments or the ligamentum flavum. Post kyphoplasty changes at L1 are incompletely imaged. The remaining  vertebral body heights in the thoracic spine are preserved. Marrow signal at the remaining levels is normal. Cord:  Normal signal and morphology. Paraspinal and other soft tissues: There is mild edema in the prevertebral soft tissues anterior to the above described fracture. Bilateral pleural effusions are noted. There is a right renal cyst, incompletely evaluated. Disc levels: There is a diffuse disc bulge, ligamentum flavum thickening, prominent dorsal epidural fat, and mild bilateral facet arthropathy at T11-T12 results  in moderate spinal canal stenosis without evidence of cord compression. The spinal canal at the remaining levels is patent. There is no high-grade neural foraminal stenosis. IMPRESSION: 1. Horizontally oriented fracture extending from the anterior T8 endplate without evidence of posterior element involvement. No loss of vertebral body height or bony retropulsion into the canal, and no evidence of epidural hematoma. No evidence of ligamentous injury. 2. No other fracture identified. 3. Moderate spinal canal stenosis at T11-T12 without evidence of cord compression. Electronically Signed   By: Valetta Mole M.D.   On: 08/17/2020 14:44   CT T-SPINE NO CHARGE  Result Date: 08/17/2020 CLINICAL DATA:  Fall yesterday. Back pain and shortness of breath. On blood thinners. EXAM: CT THORACIC AND LUMBAR SPINE WITHOUT CONTRAST TECHNIQUE: Multidetector CT imaging of the thoracic and lumbar spine was performed without contrast. Multiplanar CT image reconstructions were also generated. COMPARISON:  Chest CT same date. Lumbar MRI 01/08/2020. CTA of the chest and abdomen 10/13/2019. FINDINGS: CT THORACIC SPINE FINDINGS Alignment: Normal. Vertebrae: The bones are demineralized. There is diffuse ankylosis of the thoracic spine. There is high suspicion of a nondisplaced nearly horizontal fracture through the T8 vertebral body, best seen on the reformatted images. No clear extension to the posterior elements  identified, although there is questionable involvement of the posterior elements bilaterally at T9. No displaced fractures or traumatic subluxation identified. Paraspinal and other soft tissues: No acute paraspinal findings. Bilateral pleural effusions and bilateral pulmonary opacities, further described on chest CT. Disc levels: No large disc herniation, thoracic spinal stenosis or significant foraminal compromise. CT LUMBAR SPINE FINDINGS Segmentation: There are 5 lumbar type vertebral bodies. Alignment: Normal. Vertebrae: Patient has undergone spinal augmentation at L1 (on 02/23/2020). Underlying fracture results in approximately 50% loss of vertebral body height. There is no associated osseous retropulsion. No evidence of acute lumbar spine fracture. Paraspinal and other soft tissues: No acute paraspinal findings. Diffuse aortic and branch vessel atherosclerosis. Multifocal aneurysmal dilatation of the abdominal aorta with multiple saccular components and short segment dissections. The aorta measures up to 4.4 cm AP on image 78/4. This compares with 4.0 cm previously. No evidence of retroperitoneal hematoma. The bladder is mildly distended. Disc levels: Mild superimposed multilevel spondylosis with disc space narrowing and endplate osteophytes greatest at L5-S1. Mild facet degenerative changes. No high-grade spinal stenosis. IMPRESSION: CT THORACIC SPINE IMPRESSION 1. Diffuse ankylosis of the thoracic spine. 2. Nondisplaced nearly horizontal fracture through the T8 vertebral body with potential posterior extension into the posterior elements at T9. Given the patient's underlying rigid spine, this may reflect an unstable fracture. Consider further evaluation with thoracic MRI. 3. No evidence of thoracic paraspinal hematoma. CT LUMBAR SPINE IMPRESSION 1. No acute lumbar spine findings. 2. Recent spinal augmentation at L1. 3. Extensive aortic atherosclerosis with multifocal aneurysmal dilatation and short segment  dissection, mildly progressed from CTA 10 months ago. Consider follow-up CTA as clinically warranted. 4. These results were called by telephone at the time of interpretation on 08/17/2020 at 11:16 am to provider [facility] , who verbally acknowledged these results. Electronically Signed   By: Richardean Sale M.D.   On: 08/17/2020 11:17   DG Chest Portable 1 View  Result Date: 08/17/2020 CLINICAL DATA:  Near syncope, fall, initial encounter. EXAM: PORTABLE CHEST 1 VIEW COMPARISON:  11/10/2019 and CT chest 10/13/2019. FINDINGS: Trachea is midline. Heart size stable. Thoracic aorta is calcified. Mixed interstitial and airspace opacification bilaterally. Possible nodular consolidation in the apical left upper lobe. No definite pleural fluid. IMPRESSION:  1. Pulmonary edema. 2. Possible nodular consolidation in the left upper lobe. Followup PA and lateral chest X-ray is recommended in 3-4 weeks following trial of antibiotic therapy to ensure resolution and exclude underlying malignancy, as clinically warranted. Electronically Signed   By: Lorin Picket M.D.   On: 08/17/2020 10:01      Subjective: Patient interviewed and examined along with his nurse in the room.  Reports that he slept extremely well last night with the CPAP and this is the first night that he has slept well since hospitalization.  Dyspnea significantly improved.  Minimal and on exertion intermittently, not new for him.  No chest pain.  No other complaints reported.  As per RN, no acute issues noted.  Discharge Exam:  Vitals:   08/22/20 2215 08/23/20 0009 08/23/20 0804 08/23/20 0850  BP: 124/71  108/61   Pulse: 88 (!) 56 91   Resp: 16 16 15    Temp: 97.6 F (36.4 C)  (!) 97.4 F (36.3 C)   TempSrc: Oral  Oral   SpO2: 95% 93% (!) 89% 92%  Weight:      Height:        General exam: Elderly male, moderately built and obese lying comfortably supine in bed. Respiratory system: Clear to auscultation. Respiratory effort  normal. Cardiovascular system: S1 & S2 heard, RRR. No JVD, murmurs, rubs, gallops or clicks.  No ankle edema.  Telemetry personally reviewed: A. fib with controlled ventricular rate, BBB morphology. Gastrointestinal system: Abdomen is nondistended, soft and nontender. No organomegaly or masses felt. Normal bowel sounds heard. GU: Has indwelling Foley catheter draining straw-colored urine. Central nervous system: Alert and oriented. No focal neurological deficits. Extremities: Symmetric 5 x 5 power. Skin: No rashes, lesions or ulcers Psychiatry: Judgement and insight appear somewhat impaired.  Mood & affect appropriate.     The results of significant diagnostics from this hospitalization (including imaging, microbiology, ancillary and laboratory) are listed below for reference.     Microbiology: Recent Results (from the past 240 hour(s))  Resp Panel by RT-PCR (Flu A&B, Covid) Nasopharyngeal Swab     Status: None   Collection Time: 08/17/20 12:10 PM   Specimen: Nasopharyngeal Swab; Nasopharyngeal(NP) swabs in vial transport medium  Result Value Ref Range Status   SARS Coronavirus 2 by RT PCR NEGATIVE NEGATIVE Final    Comment: (NOTE) SARS-CoV-2 target nucleic acids are NOT DETECTED.  The SARS-CoV-2 RNA is generally detectable in upper respiratory specimens during the acute phase of infection. The lowest concentration of SARS-CoV-2 viral copies this assay can detect is 138 copies/mL. A negative result does not preclude SARS-Cov-2 infection and should not be used as the sole basis for treatment or other patient management decisions. A negative result may occur with  improper specimen collection/handling, submission of specimen other than nasopharyngeal swab, presence of viral mutation(s) within the areas targeted by this assay, and inadequate number of viral copies(<138 copies/mL). A negative result must be combined with clinical observations, patient history, and  epidemiological information. The expected result is Negative.  Fact Sheet for Patients:  EntrepreneurPulse.com.au  Fact Sheet for Healthcare Providers:  IncredibleEmployment.be  This test is no t yet approved or cleared by the [place] FDA and  has been authorized for detection and/or diagnosis of SARS-CoV-2 by FDA under an Emergency Use Authorization (EUA). This EUA will remain  in effect (meaning this test can be used) for the duration of the COVID-19 declaration under Section 564(b)(1) of the Act, 21 U.S.C.section 360bbb-3(b)(1), unless the authorization  is terminated  or revoked sooner.       Influenza A by PCR NEGATIVE NEGATIVE Final   Influenza B by PCR NEGATIVE NEGATIVE Final    Comment: (NOTE) The Xpert Xpress SARS-CoV-2/FLU/RSV plus assay is intended as an aid in the diagnosis of influenza from Nasopharyngeal swab specimens and should not be used as a sole basis for treatment. Nasal washings and aspirates are unacceptable for Xpert Xpress SARS-CoV-2/FLU/RSV testing.  Fact Sheet for Patients: EntrepreneurPulse.com.au  Fact Sheet for Healthcare Providers: IncredibleEmployment.be  This test is not yet approved or cleared by the [place] FDA and has been authorized for detection and/or diagnosis of SARS-CoV-2 by FDA under an Emergency Use Authorization (EUA). This EUA will remain in effect (meaning this test can be used) for the duration of the COVID-19 declaration under Section 564(b)(1) of the Act, 21 U.S.C. section 360bbb-3(b)(1), unless the authorization is terminated or revoked.  Performed at [facility], [place] [address] [place], [place] [address]   Culture, blood (routine x 2)     Status: None   Collection Time: 08/17/20  2:50 PM   Specimen: BLOOD LEFT FOREARM  Result Value Ref Range Status   Specimen Description BLOOD LEFT FOREARM  Final   Special Requests   Final     BOTTLES DRAWN AEROBIC AND ANAEROBIC Blood Culture adequate volume   Culture   Final    NO GROWTH 5 DAYS Performed at [facility], [place] [address] [place], [place] [address]    Report Status 08/22/2020 FINAL  Final  Culture, blood (routine x 2)     Status: None   Collection Time: 08/17/20  2:58 PM   Specimen: BLOOD  Result Value Ref Range Status   Specimen Description BLOOD LEFT ANTECUBITAL  Final   Special Requests   Final    BOTTLES DRAWN AEROBIC AND ANAEROBIC Blood Culture adequate volume   Culture   Final    NO GROWTH 5 DAYS Performed at [facility], [place] [address] [place], [place] [address]    Report Status 08/22/2020 FINAL  Final  Resp Panel by RT-PCR (Flu A&B, Covid) Nasopharyngeal Swab     Status: None   Collection Time: 08/23/20  9:43 AM   Specimen: Nasopharyngeal Swab; Nasopharyngeal(NP) swabs in vial transport medium  Result Value Ref Range Status   SARS Coronavirus 2 by RT PCR NEGATIVE NEGATIVE Final    Comment: (NOTE) SARS-CoV-2 target nucleic acids are NOT DETECTED.  The SARS-CoV-2 RNA is generally detectable in upper respiratory specimens during the acute phase of infection. The lowest concentration of SARS-CoV-2 viral copies this assay can detect is 138 copies/mL. A negative result does not preclude SARS-Cov-2 infection and should not be used as the sole basis for treatment or other patient management decisions. A negative result may occur with  improper specimen collection/handling, submission of specimen other than nasopharyngeal swab, presence of viral mutation(s) within the areas targeted by this assay, and inadequate number of viral copies(<138 copies/mL). A negative result must be combined with clinical observations, patient history, and epidemiological information. The expected result is Negative.  Fact Sheet for Patients:  EntrepreneurPulse.com.au  Fact Sheet for Healthcare Providers:   IncredibleEmployment.be  This test is no t yet approved or cleared by the [place] FDA and  has been authorized for detection and/or diagnosis of SARS-CoV-2 by FDA under an Emergency Use Authorization (EUA). This EUA will remain  in effect (meaning this test can be used) for the duration of  the COVID-19 declaration under Section 564(b)(1) of the Act, 21 U.S.C.section 360bbb-3(b)(1), unless the authorization is terminated  or revoked sooner.       Influenza A by PCR NEGATIVE NEGATIVE Final   Influenza B by PCR NEGATIVE NEGATIVE Final    Comment: (NOTE) The Xpert Xpress SARS-CoV-2/FLU/RSV plus assay is intended as an aid in the diagnosis of influenza from Nasopharyngeal swab specimens and should not be used as a sole basis for treatment. Nasal washings and aspirates are unacceptable for Xpert Xpress SARS-CoV-2/FLU/RSV testing.  Fact Sheet for Patients: EntrepreneurPulse.com.au  Fact Sheet for Healthcare Providers: IncredibleEmployment.be  This test is not yet approved or cleared by the [place] FDA and has been authorized for detection and/or diagnosis of SARS-CoV-2 by FDA under an Emergency Use Authorization (EUA). This EUA will remain in effect (meaning this test can be used) for the duration of the COVID-19 declaration under Section 564(b)(1) of the Act, 21 U.S.C. section 360bbb-3(b)(1), unless the authorization is terminated or revoked.  Performed at [facility], [place] [address] [place], [place] [address]      Labs: CBC: Recent Labs  Lab 08/17/20 0912 08/17/20 1323 08/17/20 1932 08/21/20 0311 08/22/20 0747 08/23/20 0240  WBC 5.5 5.2 6.2 6.0 7.3 6.1  NEUTROABS 3.7 3.7 4.8  --   --   --   HGB 9.0* 9.4* 8.6* 8.9* 9.9* 8.5*  HCT 28.4* 30.2* 28.1* 27.9* 30.1* 26.1*  MCV 98.3 99.0 100.7* 97.2 95.3 95.3  PLT 85* 89* 83* 99* 116* 114*    Basic Metabolic Panel: Recent Labs  Lab 08/17/20 1323  08/18/20 0325 08/20/20 0030 08/21/20 0311 08/22/20 0747 08/23/20 0240  NA  --  137 134* 137 137 137  K  --  3.2* 3.6 3.3* 3.0* 3.9  CL  --  104 100 102 100 102  CO2  --  24 24 27 26 26   GLUCOSE  --  119* 102* 95 114* 102*  BUN  --  17 19 20 22  24*  CREATININE  --  1.37* 1.59* 1.56* 1.45* 1.49*  CALCIUM  --  8.5* 8.4* 8.7* 9.0 8.9  MG 2.6* 2.4  --   --   --  2.1    Liver Function Tests: Recent Labs  Lab 08/23/20 0240  AST 28  ALT 29  ALKPHOS 75  BILITOT 0.7  PROT 5.4*  ALBUMIN 2.3*      Urinalysis    Component Value Date/Time   COLORURINE STRAW (A) 08/21/2020 1139   APPEARANCEUR CLEAR 08/21/2020 1139   LABSPEC 1.010 08/21/2020 1139   PHURINE 6.0 08/21/2020 1139   GLUCOSEU NEGATIVE 08/21/2020 1139   HGBUR NEGATIVE 08/21/2020 1139   BILIRUBINUR NEGATIVE 08/21/2020 [place] 08/21/2020 1139   PROTEINUR NEGATIVE 08/21/2020 1139   UROBILINOGEN 1.0 03/22/2012 0845   NITRITE NEGATIVE 08/21/2020 1139   LEUKOCYTESUR NEGATIVE 08/21/2020 1139      Time coordinating discharge: 45 minutes  SIGNED:  Vernell Leep, MD, FACP, [facility]. [facility]  To contact the attending provider between 7A-7P or the covering provider during after hours 7P-7A, please log into the web site [email or web address] and access using universal [place] password for that web site. If you do not have the password, please call the hospital operator.

## 2020-08-24 DIAGNOSIS — E785 Hyperlipidemia, unspecified: Secondary | ICD-10-CM | POA: Diagnosis not present

## 2020-08-24 DIAGNOSIS — G4700 Insomnia, unspecified: Secondary | ICD-10-CM | POA: Diagnosis not present

## 2020-08-24 DIAGNOSIS — I10 Essential (primary) hypertension: Secondary | ICD-10-CM | POA: Diagnosis not present

## 2020-08-24 DIAGNOSIS — M353 Polymyalgia rheumatica: Secondary | ICD-10-CM | POA: Diagnosis not present

## 2020-08-30 DIAGNOSIS — I10 Essential (primary) hypertension: Secondary | ICD-10-CM | POA: Diagnosis not present

## 2020-08-30 DIAGNOSIS — G20 Parkinson's disease: Secondary | ICD-10-CM | POA: Diagnosis not present

## 2020-08-30 DIAGNOSIS — R262 Difficulty in walking, not elsewhere classified: Secondary | ICD-10-CM | POA: Diagnosis not present

## 2020-08-30 DIAGNOSIS — I480 Paroxysmal atrial fibrillation: Secondary | ICD-10-CM | POA: Diagnosis not present

## 2020-09-07 ENCOUNTER — Ambulatory Visit: Payer: Medicare Other | Admitting: Student

## 2020-09-07 ENCOUNTER — Encounter: Payer: Self-pay | Admitting: Student

## 2020-09-07 ENCOUNTER — Other Ambulatory Visit: Payer: Self-pay

## 2020-09-07 VITALS — BP 123/63 | HR 63 | Temp 98.0°F | Resp 16 | Ht 73.0 in | Wt 213.0 lb

## 2020-09-07 DIAGNOSIS — I5032 Chronic diastolic (congestive) heart failure: Secondary | ICD-10-CM

## 2020-09-07 DIAGNOSIS — I480 Paroxysmal atrial fibrillation: Secondary | ICD-10-CM | POA: Diagnosis not present

## 2020-09-07 MED ORDER — TORSEMIDE 40 MG PO TABS: 40.0000 mg | ORAL_TABLET | Freq: Two times a day (BID) | ORAL | 3 refills | Status: DC

## 2020-09-07 NOTE — Progress Notes (Signed)
Primary Physician/Referring:  [name], MD  Patient ID: [name] [name], male    DOB: Nov 23, 1926, 87 y.o.   MRN: [id number]  Chief Complaint  Patient presents with   Paroxysmal atrial fibrillation    Hospitalization Follow-up   HPI:    [name] [name]  is a 87 y.o. Caucasian male  with vitamin B12 deficiency, mild dementia, hyperlipidemia, hypertension, CAD, chronic stable angina status post CABG x 3 and Maze and LAA amputation in 2014, paroxysmal atrial fibrillation, obstructive sleep apnea not currently on CPAP, moderate aortic stenosis with thoracic aortic aneurysm and dilation of ascending aorta and follows Dr. Lilly Cove (No correlation with TTE with regards to aneurysm), venous stasis edema. Patient underwent TAVR 11/10/2019, at which time he presented in atypical atrial flutter but negative chronotropic were avoided due to age, aortic valve disease and bradycardia. He spontaneously converted to sinus, but presented to our office in atrial fibrillation and acute decompensated heart failure on 11/13/2019.  At this time he was started on increased dose of diuretics as well as amiodarone.   Patient presents for hospital follow-up.  He was admitted 08/18/2020 - 08/23/2020 for vertebral fracture following a fall.  During admission patient was treated with IV Lasix for acute on chronic diastolic heart failure and diuresed 12.5 L.  Discussed option of ambulatory cardiac monitor at last visit, however held off at last visit.  Patient states he continues to progress in PT and OT in the rehab center where he has been recovering.  He does notably have indwelling urinary catheter at this time for which she is following with urology.  Patient states that since discharge from the hospital he has had worsening leg swelling bilaterally.  Denies orthopnea, PND, chest pain, dizziness, syncope, near syncope.  He is tolerating anticoagulation without bleeding diathesis.  He continues to follow with Dr. Haynes Kerns for  management of anemia.  Past Medical History:  Diagnosis Date   AAA (abdominal aortic aneurysm) ([place])    Acute coronary syndrome ([place]) 0/78/6754   Acute systolic heart failure ([place]) 03/03/2012   Angina    Arthritis    Ascending aorta dilatation ([place]) 03/13/2012   Fusiform dilatation of the ascending thoracic aorta discovered during surgery, max diameter 4.2-4.3 cm   Atrial fibrillation (H[facility]) 02/29/2012   Recurrent paroxysmal, new-onset   BPH (benign prostatic hypertrophy)    CAD (coronary artery disease)    Cath April 2012 40% left main, 50% LAD, 40% OM, occluded RCA with L-R collaterals    CHF (congestive heart failure) (H[facility])    Chronic venous insufficiency    Dyspnea    GERD (gastroesophageal reflux disease)    Glaucoma    Gout    Headache    MIGRAINES in the past   History of kidney stones    Hyperlipidemia    Hypertension    MI (myocardial infarction) ([place])    Obesity (BMI 30-39.9)    Paroxysmal atrial fibrillation (H[facility]) 02/29/2012   Recurrent paroxysmal, new-onset    PMR (polymyalgia rheumatica) (H[facility])    Pre-diabetes    S/P CABG x 3 03/06/2012   LIMA to LAD, SVG to OM, SVG to RCA, EVH via right thigh   S/P Maze operation for atrial fibrillation 03/06/2012   Complete bilateral lesions set using bipolar radiofrequency and cryothermy ablation with clipping of LA appendage   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   s/p TAVR with a 26 mm Edwards via the left subclavian by Drs Buena Irish and Cyndia Bent  Severe aortic stenosis 09/29/2019   Sleep apnea    USES CPAP NIGHTLY   Past Surgical History:  Procedure Laterality Date   CARDIAC CATHETERIZATION     CATARACT EXTRACTION, BILATERAL     with lens implants   CHOLECYSTECTOMY N/A 03/24/2012   Procedure: LAPAROSCOPIC CHOLECYSTECTOMY WITH INTRAOPERATIVE CHOLANGIOGRAM;  Surgeon: Zenovia Jarred, MD;  Location: [facility];  Service: General;  Laterality: N/A;   CORONARY ARTERY BYPASS GRAFT N/A 03/06/2012   Procedure: CORONARY  ARTERY BYPASS GRAFTING (CABG);  Surgeon: Rexene Alberts, MD;  Location: [place];  Service: Open Heart Surgery;  Laterality: N/A;   ENDOVEIN HARVEST OF GREATER SAPHENOUS VEIN Right 03/06/2012   Procedure: ENDOVEIN HARVEST OF GREATER SAPHENOUS VEIN;  Surgeon: Rexene Alberts, MD;  Location: [place];  Service: Open Heart Surgery;  Laterality: Right;   INTRAOPERATIVE TRANSESOPHAGEAL ECHOCARDIOGRAM N/A 03/06/2012   Procedure: INTRAOPERATIVE TRANSESOPHAGEAL ECHOCARDIOGRAM;  Surgeon: Rexene Alberts, MD;  Location: [place];  Service: Open Heart Surgery;  Laterality: N/A;   IR KYPHO LUMBAR INC FX REDUCE BONE BX UNI/BIL CANNULATION INC/IMAGING  02/23/2020   LEFT HEART CATH Right 03/02/2012   Procedure: LEFT HEART CATH;  Surgeon: Sherren Mocha, MD;  Location[facility] CATH LAB;  Service: Cardiovascular;  Laterality: Right;   MAZE N/A 03/06/2012   Procedure: MAZE;  Surgeon: Rexene Alberts, MD;  Location: [place];  Service: Open Heart Surgery;  Laterality: N/A;   PROSTATECTOMY     partial   RIGHT/LEFT HEART CATH AND CORONARY/GRAFT ANGIOGRAPHY N/A 09/29/2019   Procedure: RIGHT/LEFT HEART CATH AND CORONARY/GRAFT ANGIOGRAPHY;  Surgeon: Adrian Prows, MD;  Location: [place] CV LAB;  Service: Cardiovascular;  Laterality: N/A;   TEE WITHOUT CARDIOVERSION N/A 11/10/2019   Procedure: TRANSESOPHAGEAL ECHOCARDIOGRAM (TEE);  Surgeon: Burnell Blanks, MD;  Location: [name];  Service: Open Heart Surgery;  Laterality: N/A;   TRANSCATHETER AORTIC VALVE REPLACEMENT, TRANSFEMORAL  11/10/2019   Family History  Problem Relation Age of Onset   Dementia Mother    Stroke Brother     Social History   Tobacco Use   Smoking status: Former    Packs/day: 0.50    Years: 15.00    Pack years: 7.50    Types: Cigarettes    Quit date: 11/18/1962    Years since quitting: 57.8   Smokeless tobacco: Never  Substance Use Topics   Alcohol use: Yes    Alcohol/week: 2.0 standard drinks    Types: 2 Shots of liquor per week    Comment: daily    ROS  Review of Systems  Constitutional: Positive for malaise/fatigue (improving). Negative for weight gain.  Cardiovascular:  Positive for dyspnea on exertion (improved) and leg swelling (worse since discharge). Negative for chest pain, claudication, irregular heartbeat, near-syncope, orthopnea, palpitations, paroxysmal nocturnal dyspnea and syncope.  Hematologic/Lymphatic: Does not bruise/bleed easily.  Musculoskeletal:  Positive for arthritis (mild and stable), back pain, joint pain and myalgias (recent diagnosis of PMR).  Neurological:  Negative for dizziness.  Objective  Blood pressure 123/63, pulse 63, temperature 98 F (36.7 C), resp. rate 16, height $RemoveBe'6\' 1"'oJiVnehjt$  (1.854 m), weight 213 lb (96.6 kg), SpO2 94 %.  Vitals with BMI 09/07/2020 08/23/2020 08/23/2020  Height $Remov'6\' 1"'JBvOvY$  - -  Weight 213 lbs - -  BMI 88.50 - -  Systolic 277 412 878  Diastolic 63 69 61  Pulse 63 96 91     Physical Exam Vitals reviewed.  Constitutional:      General: He is not in acute  distress.    Appearance: He is well-developed.     Comments: Presented to today's office visit in wheelchair  Cardiovascular:     Rate and Rhythm: Normal rate. Rhythm irregular.     Pulses:          Carotid pulses are 2+ on the right side and 2+ on the left side.      Radial pulses are 2+ on the right side and 2+ on the left side.       Femoral pulses are 2+ on the right side and 2+ on the left side.      Popliteal pulses are 2+ on the right side and 2+ on the left side.       Dorsalis pedis pulses are 0 on the right side and 1+ on the left side.       Posterior tibial pulses are 0 on the right side and 0 on the left side.     Heart sounds: Murmur heard.  Early systolic murmur is present with a grade of 2/6 at the upper right sternal border. S1 is normal, S2 is muffled.    No gallop.     Comments: No JVD Pulmonary:     Effort: Pulmonary effort is normal.     Breath sounds: No rhonchi or rales.  Musculoskeletal:     Right lower  leg: Edema (1+ pitting) present.     Left lower leg: Edema (1+ pitting) present.  Skin:    General: Skin is warm and dry.  Neurological:     Mental Status: He is alert.   Laboratory examination:   Recent Labs    10/09/19 1151 10/13/19 0838 11/19/19 1410 12/18/19 1124 02/23/20 1135 08/21/20 0311 08/22/20 0747 08/23/20 0240  NA 141   < > 139 139   < > 137 137 137  K 4.1   < > 4.3 4.1   < > 3.3* 3.0* 3.9  CL 103   < > 100 100   < > 102 100 102  CO2 23   < > 25 25   < > _0 GLUCOSE 115*   < > 105* 119*   < > 95 114* 102*  BUN 25   < > 29 21   < > 20 22 24*  CREATININE 1.33*   < > 1.52* 1.77*   < > 1.56* 1.45* 1.49*  CALCIUM 10.0   < > 9.7 9.9   < > 8.7* 9.0 8.9  GFRNONAA 46*   < > 39* 33*   < > 41* 45* 44*  GFRAA 54*  --  46* 38*  --   --   --   --    < > = values in this interval not displayed.   estimated creatinine clearance is 38.7 mL/min (A) (by C-G formula based on SCr of 1.49 mg/dL (H)).  CMP Latest Ref Rng & Units 08/23/2020 08/22/2020 08/21/2020  Glucose 70 - 99 mg/dL 102(H) 114(H) 95  BUN 8 - 23 mg/dL 24(H) 22 20  Creatinine 0.61 - 1.24 mg/dL 1.49(H) 1.45(H) 1.56(H)  Sodium 135 - 145 mmol/L 137 137 137  Potassium 3.5 - 5.1 mmol/L 3.9 3.0(L) 3.3(L)  Chloride 98 - 111 mmol/L 102 100 102  CO2 22 - 32 mmol/L _1 Calcium 8.9 - 10.3 mg/dL 8.9 9.0 8.7(L)  Total Protein 6.5 - 8.1 g/dL 5.4(L) - -  Total Bilirubin 0.3 - 1.2 mg/dL 0.7 - -  Alkaline Phos 38 - 126  U/L 75 - -  AST 15 - 41 U/L 28 - -  ALT 0 - 44 U/L 29 - -   CBC Latest Ref Rng & Units 08/23/2020 08/22/2020 08/21/2020  WBC 4.0 - 10.5 K/uL 6.1 7.3 6.0  Hemoglobin 13.0 - 17.0 g/dL 8.5(L) 9.9(L) 8.9(L)  Hematocrit 39.0 - 52.0 % 26.1(L) 30.1(L) 27.9(L)  Platelets 150 - 400 K/uL 114(L) 116(L) 99(L)   TSH Recent Labs    09/23/19 1341  TSH 1.950   BNP    Component Value Date/Time   BNP 199.4 (H) 08/23/2020 0240    ProBNP    Component Value Date/Time   PROBNP 185 08/29/2018 1218   PROBNP 431.9  06/25/2012 1420     External labs:  06/14/2020: Glucose 210, BUN 30, creatinine 2.1, GFR 29.7, sodium 134, potassium 4.9,  06/10/2020:  Glucose 5, BUN 40, creatinine 2.1, GFR 29.7, sodium 131, potassium 5.2, AST 20, ALT 27, alk phos 90  06/07/2020: Hemoglobin 9.6, hematocrit 29.9, MCV 95, platelet 168 Sed rate 56, CRP 197 ALT 23, AST 43, alk phos 105, 135, potassium 4.4, creatinine 1.77, BUN 30 Will, GFR 36 A1c 6.0%  Cholesterol, total 115.000 m 01/19/2019 HDL 47 MG/DL 01/19/2019 LDL 51.000 mg 01/19/2019 Triglycerides 84.000 01/19/2019  A1C 5.600 % 01/19/2019; TSH 2.320 01/19/2019  Hemoglobin 10.900 g/ 02/06/2019; INR 1.900 09/11/2018 Platelets 182.000 09/11/2018  Creatinine, Serum 1.300 mg/ 02/06/2019 Potassium 4.100 09/11/2018 Magnesium N/D ALT (SGPT) 15.000 uni 01/19/2019 Allergies   Allergies  Allergen Reactions   Amoxicillin Other (See Comments)    Headache, debilitated   Bee Venom Anaphylaxis   Avelox [Moxifloxacin]     Other reaction(s): Unknown   Duloxetine Hcl     Other reaction(s): felt weird   Levitra [Vardenafil]     Other reaction(s): Unknown   Morphine And Related Other (See Comments)    hypotension   Testosterone Other (See Comments)    unknown   Viagra [Sildenafil]     Other reaction(s): didn't like it    Medications Prior to Visit:   Outpatient Medications Prior to Visit  Medication Sig Dispense Refill   acetaminophen (TYLENOL) 500 MG tablet Take 2 tablets (1,000 mg total) by mouth 3 (three) times daily. For 3 to 5 days and then change to 1000 mg 3 times daily as needed for pain.     amiodarone (PACERONE) 200 MG tablet Take 1 tablet (200 mg total) by mouth daily. 90 tablet 3   amiodarone (PACERONE) 200 MG tablet 1 tablet     amLODipine (NORVASC) 10 MG tablet Take 1 tablet (10 mg total) by mouth daily. 90 tablet 3   cholecalciferol (VITAMIN D3) 25 MCG (1000 UNIT) tablet Take 1,000 Units by mouth daily.      denosumab (PROLIA) 60 MG/ML SOSY injection Inject 60  mg into the skin every 6 (six) months.     docusate sodium (COLACE) 100 MG capsule Take 1 capsule (100 mg total) by mouth 2 (two) times daily.     EPINEPHrine 0.3 mg/0.3 mL IJ SOAJ injection Inject 0.3 mLs (0.3 mg total) into the muscle as needed for anaphylaxis. (Patient taking differently: Inject 0.3 mg into the muscle once as needed for anaphylaxis.) 2 each 2   ezetimibe (ZETIA) 10 MG tablet Take 10 mg by mouth daily.     fish oil-omega-3 fatty acids 1000 MG capsule Take 1 g by mouth daily.      fluticasone (FLONASE) 50 MCG/ACT nasal spray Place 1 spray into both nostrils daily as needed for  allergies.     gabapentin (NEURONTIN) 100 MG capsule Take 100 mg by mouth 3 (three) times daily.     galantamine (RAZADYNE ER) 8 MG 24 hr capsule Take 8 mg by mouth Daily.     HONEY BEE VENOM IJ Inject 2 each as directed See admin instructions. Every eight weeks     hydrALAZINE (APRESOLINE) 25 MG tablet Take 25 mg by mouth 2 (two) times daily.     isosorbide mononitrate (IMDUR) 60 MG 24 hr tablet TAKE 1 TABLET(60 MG) BY MOUTH DAILY AFTER SUPPER (Patient taking differently: Take 60 mg by mouth daily.) 90 tablet 1   leflunomide (ARAVA) 20 MG tablet Take 10 mg by mouth daily.     magnesium gluconate (MAGONATE) 500 MG tablet Take 500 mg by mouth daily.     memantine (NAMENDA) 10 MG tablet Take 10 mg by mouth 2 (two) times daily.     metoprolol succinate (TOPROL-XL) 25 MG 24 hr tablet Take 1 tablet (25 mg total) by mouth daily as needed (for high heart rate, consistently greater than 110/min.).     nitroGLYCERIN (NITROSTAT) 0.4 MG SL tablet Place 1 tablet (0.4 mg total) under the tongue every 5 (five) minutes x 3 doses as needed for chest pain. 30 tablet 3   oxyCODONE (OXY IR/ROXICODONE) 5 MG immediate release tablet Take 0.5 tablets (2.5 mg total) by mouth every 12 (twelve) hours as needed for breakthrough pain. 3 tablet 0   pantoprazole (PROTONIX) 40 MG tablet Take 40 mg by mouth every other day.   4    polyethylene glycol (MIRALAX / GLYCOLAX) 17 g packet Take 17 g by mouth 2 (two) times daily.     potassium chloride (MICRO-K) 10 MEQ CR capsule TAKE ONE CAPSULE BY MOUTH DAILY WITH LASIX 90 capsule 1   predniSONE (DELTASONE) 1 MG tablet Take 9 mg by mouth daily with breakfast.     PRESCRIPTION MEDICATION Cpap     Psyllium Husk POWD Take 1 Scoop by mouth daily.      Rivaroxaban (XARELTO) 15 MG TABS tablet Take 15 mg by mouth daily with supper.     tamsulosin (FLOMAX) 0.4 MG CAPS capsule Take 1 capsule (0.4 mg total) by mouth daily after supper.     triamcinolone cream (KENALOG) 0.1 % Apply 1 application topically daily as needed (skin irritation).     vitamin B-12 (CYANOCOBALAMIN) 1000 MCG tablet Take 1,000 mcg by mouth daily.      furosemide (LASIX) 40 MG tablet TAKE 1 TABLET(40 MG) BY MOUTH TWICE DAILY AT 10 AM AND AT 4 PM 180 tablet 0   No facility-administered medications prior to visit.   Final Medications at End of Visit    Current Meds  Medication Sig   acetaminophen (TYLENOL) 500 MG tablet Take 2 tablets (1,000 mg total) by mouth 3 (three) times daily. For 3 to 5 days and then change to 1000 mg 3 times daily as needed for pain.   amiodarone (PACERONE) 200 MG tablet Take 1 tablet (200 mg total) by mouth daily.   amiodarone (PACERONE) 200 MG tablet 1 tablet   amLODipine (NORVASC) 10 MG tablet Take 1 tablet (10 mg total) by mouth daily.   cholecalciferol (VITAMIN D3) 25 MCG (1000 UNIT) tablet Take 1,000 Units by mouth daily.    denosumab (PROLIA) 60 MG/ML SOSY injection Inject 60 mg into the skin every 6 (six) months.   docusate sodium (COLACE) 100 MG capsule Take 1 capsule (100 mg total) by mouth 2 (  two) times daily.   EPINEPHrine 0.3 mg/0.3 mL IJ SOAJ injection Inject 0.3 mLs (0.3 mg total) into the muscle as needed for anaphylaxis. (Patient taking differently: Inject 0.3 mg into the muscle once as needed for anaphylaxis.)   ezetimibe (ZETIA) 10 MG tablet Take 10 mg by mouth daily.    fish oil-omega-3 fatty acids 1000 MG capsule Take 1 g by mouth daily.    fluticasone (FLONASE) 50 MCG/ACT nasal spray Place 1 spray into both nostrils daily as needed for allergies.   gabapentin (NEURONTIN) 100 MG capsule Take 100 mg by mouth 3 (three) times daily.   galantamine (RAZADYNE ER) 8 MG 24 hr capsule Take 8 mg by mouth Daily.   HONEY BEE VENOM IJ Inject 2 each as directed See admin instructions. Every eight weeks   hydrALAZINE (APRESOLINE) 25 MG tablet Take 25 mg by mouth 2 (two) times daily.   isosorbide mononitrate (IMDUR) 60 MG 24 hr tablet TAKE 1 TABLET(60 MG) BY MOUTH DAILY AFTER SUPPER (Patient taking differently: Take 60 mg by mouth daily.)   leflunomide (ARAVA) 20 MG tablet Take 10 mg by mouth daily.   magnesium gluconate (MAGONATE) 500 MG tablet Take 500 mg by mouth daily.   memantine (NAMENDA) 10 MG tablet Take 10 mg by mouth 2 (two) times daily.   metoprolol succinate (TOPROL-XL) 25 MG 24 hr tablet Take 1 tablet (25 mg total) by mouth daily as needed (for high heart rate, consistently greater than 110/min.).   nitroGLYCERIN (NITROSTAT) 0.4 MG SL tablet Place 1 tablet (0.4 mg total) under the tongue every 5 (five) minutes x 3 doses as needed for chest pain.   oxyCODONE (OXY IR/ROXICODONE) 5 MG immediate release tablet Take 0.5 tablets (2.5 mg total) by mouth every 12 (twelve) hours as needed for breakthrough pain.   pantoprazole (PROTONIX) 40 MG tablet Take 40 mg by mouth every other day.    polyethylene glycol (MIRALAX / GLYCOLAX) 17 g packet Take 17 g by mouth 2 (two) times daily.   potassium chloride (MICRO-K) 10 MEQ CR capsule TAKE ONE CAPSULE BY MOUTH DAILY WITH LASIX   predniSONE (DELTASONE) 1 MG tablet Take 9 mg by mouth daily with breakfast.   PRESCRIPTION MEDICATION Cpap   Psyllium Husk POWD Take 1 Scoop by mouth daily.    Rivaroxaban (XARELTO) 15 MG TABS tablet Take 15 mg by mouth daily with supper.   tamsulosin (FLOMAX) 0.4 MG CAPS capsule Take 1 capsule (0.4 mg  total) by mouth daily after supper.   triamcinolone cream (KENALOG) 0.1 % Apply 1 application topically daily as needed (skin irritation).   vitamin B-12 (CYANOCOBALAMIN) 1000 MCG tablet Take 1,000 mcg by mouth daily.    [DISCONTINUED] furosemide (LASIX) 40 MG tablet TAKE 1 TABLET(40 MG) BY MOUTH TWICE DAILY AT 10 AM AND AT 4 PM   [DISCONTINUED] torsemide 40 MG TABS Take 40 mg by mouth 2 (two) times daily.    Radiology:   CT angiogram of the chest 04/29/2017: Stable ascending thoracic aortic aneurysm measuring up to 4.3 cm. Recommend annual imaging followup by CTA or MRA. This recommendation follows 2010 ACCF/AHA/AATS/ACR/ASA/SCA/SCAI/SIR/STS/SVM Guidelines for the Diagnosis and Management of Patients with Thoracic Aortic Disease. Circulation. 2010; 121: e266-e369   Atherosclerotic changes thoracic aorta with irregular plaque/small ulceration thoracic-abdominal aortic junction similar to prior exam.   Atherosclerotic changes origin of great vessels most notable involving the left subclavian artery.   Atherosclerotic changes with narrowing superior mesenteric artery and right renal artery.   Superior aspect of  abdominal aortic aneurysm incompletely assessed on the present exam.   Post CABG.  Prominent coronary artery calcification.   Aortic Atherosclerosis (ICD10-I70.0).  Coronary CTA 10/13/2019:  1. Tri- leaflet AV with annular area 540 mm2 upper limit for 26 mm Sapien 3 valve Despite large sinuses commissural calcium may suggest smaller valve better than 29 mm valve   2. Optimum angiographic angle for deployment LAO 16 Caudal 23 degrees   3.  Moderate aortic root dilatation 4.4 cm   4. Coronary arteries sufficient height above annulus for deployment with patent SVG to RCA/OM1 and patent LIMA to LAD   5.  LAA has been clipped with no contrast flow into the appendage  CTA Chest/abd/pelvis 10/13/2019:  Vascular/Lymphatic: Aortic atherosclerosis, with vascular findings and  measurements pertinent to potential TAVR procedure, as detailed below. In addition, there is multifocal aneurysmal dilatation and short segment dissections of both the abdominal aorta and the common iliac arteries bilaterally. Multifocal fusiform aneurysmal dilatation of the infrarenal abdominal aorta measuring up to 3.8 x 3.1 cm proximally and 4.1 x 3.0 cm more distally, with multiple short segment dissection flaps within these regions. Aneurysmal dilatation of the common iliac arteries measuring 1.9 cm in diameter on the right and 1.6 cm in diameter on the left. No lymphadenopathy noted in the abdomen or pelvis.  IMPRESSION: 1. Vascular findings and measurements pertinent to potential TAVR procedure, as detailed above. Please note the presence of multiple aneurysms and short-segment dissections involving the infrarenal abdominal aorta as well as the common iliac arteries bilaterally. 2. Severe thickening calcification of the aortic valve, compatible with the reported clinical history of severe aortic stenosis. 3. Aortic atherosclerosis, in addition to left main and 3 vessel coronary artery disease. Status post median sternotomy for CABG including LIMA to the LAD. 4. Colonic diverticulosis without evidence of acute diverticulitis at this time. 5. Additional incidental findings, as above.  Cardiac Studies:   Nuclear stress test   12/19/17: 1. Lexiscan stress test with low level exercise was performed. Exercise capacity was not assessed. Stress symptoms included dyspnea. Peak effect blood pressure was 114/66 mmHg. The resting and stress electrocardiogram demonstrated normal sinus rhythm, RBBB, no resting arrhythmias and normal rest repolarization. Stress EKG is non diagnostic for ischemia as it is a pharmacologic stress. 2. The overall quality of the study is good. Left ventricular cavity is noted to be mildly enlarged on the rest and stress studies. Gated SPECT imaging demonstrated  mild hypokinesis of the basal inferolateral and mid inferolateral myocardial wall(s). The left ventricular ejection fraction was calculated or visually estimated to be 69%. Medium sized, medium intensity, fixed perfusion defect in mid to basal inferolateral myocardium suggests old LCx territory infarct with no periinfarct ischemia. 3. Low risk study.   ABI 01/30/2019: Right: Resting right ankle-brachial index indicates moderate right lower extremity arterial disease. The right toe-brachial index is abnormal.  Left: Resting left ankle-brachial index indicates mild left lower extremity arterial disease. The left toe-brachial index is abnormal.   Right and left heart catheterization 09/29/2019: Right heart catheterization RA 8/8, mean 6 mmHg.  RV 34/0, EDP 8 mmHg.;  PA 31/11, mean 18 mmHg.  PA saturation 62%.;  PW 13/25, mean 11 mmHg.  Aortic saturation 100%. Cardiac output 5.57, cardiac index 2.38, normal.  Normal SVR and PVR. LV: 1/4, EDP 14 mmHg.  Peak to peak aortic valve gradient was 41 with a mean gradient of 33.3 with a valve area of 1.09 cm.   Left heart catheterization Left main is  calcific 50% to 60% stenosis distally and involves ostium of the large circumflex with a ostial 80% stenosis.  Gives origin to large OM1, OM 2. SVG to OM1 is widely patent and retrogradely fills the entire circumflex.  There is a 70 to 75% stenosis proximal to the graft insertion. LAD: Gives origin to large D1 and D2, mild diffuse disease, subtotally occluded to occluded in the midsegment. LIMA to LAD: Widely patent. RCA: Dominant and occluded in the proximal segment. SVG to RCA: Widely patent.   Impression: Moderate to severe aortic stenosis, symptomatic.  Patient will benefit from aortic valve replacement and consideration for TAVR.  110 mL contrast utilized, fluoroscopy time 42.8 minutes.  TAVR 11/10/2019:  TRANSCATHETER AORTIC VALVE REPLACEMENT, LEFT SUBCLAVIAN USING EDWARDS SAPIEN 3 ULTRA 26 MM THV  AORTIC VALVE.  Cardiac Monitor 11/11/2019 - 12/08/2019:  Sinus rhythm with episodes of atrial flutter vs atrial tachycardia Premature atrial contractions No evidence of high grade AV block or pauses.   Echocardiogram 06/08/2020:  Left ventricle cavity is normal in size. Moderate concentric hypertrophy  of the left ventricle. Normal global wall motion. Normal LV systolic  function with EF 64%. Doppler evidence of grade II (pseudonormal)  diastolic dysfunction, elevated LAP.  Well seated bioprosthetic aortic valve (63m Sapien) with trivial  perivalvular regurgitation. Mean pG 11 mmHg which is unchanged from prior  and likely normal post TAVR.  No evidence of pulmonary hypertension.  No significant change compared to previous study on 12/08/2019.   EKG  09/07/2020: Atrial fibrillation with controlled ventricular response at a rate of 60 bpm.  Left axis.  Right bundle branch block.  Bifascicular block.  Nonspecific T wave abnormality.  06/15/2020: Sinus rhythm at a rate of 81 bpm.  Left axis.  Right bundle branch block. Bifascicular block.   12/16/2019: Sinus bradycardia at a rate of 58 bpm.  Left axis, left anterior fascicular block.  Right bundle branch block.  Bifascicular block.  Compared to EKG 11/13/2019, previous nonspecific T wave abnormality of inferior leads not prominent.  11/13/2019 atrial fibrillation with controlled ventricular response at the rate of 76 bpm, left axis deviation, left anterior fascicular block.  Right bundle branch block.  Nonspecific T wave flattening inferior leads.  Normal eQT interval.   Assessment     ICD-10-CM   1. Paroxysmal atrial fibrillation (HCC)  I48.0 EKG 12-Lead    2. Chronic diastolic CHF (congestive heart failure) (HCC)  I50.32 Brain natriuretic peptide    Basic metabolic panel     Meds ordered this encounter  Medications   DISCONTD: torsemide 40 MG TABS    Sig: Take 40 mg by mouth 2 (two) times daily.    Dispense:  180 tablet    Refill:   3   DISCONTD: Torsemide 40 MG TABS    Sig: Take 40 mg by mouth 2 (two) times daily.    Dispense:  180 tablet    Refill:  3   Torsemide 40 MG TABS    Sig: Take 40 mg by mouth 2 (two) times daily.    Dispense:  180 tablet    Refill:  3     Medications Discontinued During This Encounter  Medication Reason   furosemide (LASIX) 40 MG tablet Change in therapy   torsemide 40 MG TABS Reorder   Torsemide 40 MG TABS      This patients CHA2DS2-VASc Score 5 (CHF, HTN, MI, Age) and yearly risk of stroke 7.2%.   NYHA Class II   Recommendations:  [name] [name]  is a  87 y.o. Caucasian male  with vitamin B12 deficiency, mild dementia, hyperlipidemia, hypertension, CAD, chronic stable angina status post CABG x 3 and Maze and LAA amputation in 2014, paroxysmal atrial fibrillation, obstructive sleep apnea not currently on CPAP, moderate aortic stenosis with thoracic aortic aneurysm and dilation of ascending aorta and follows Dr. Lilly Cove (No correlation with TTE with regards to aneurysm), venous stasis edema. Patient underwent TAVR 11/10/2019, at which time he presented in atypical atrial flutter but negative chronotropic were avoided due to age, aortic valve disease and bradycardia. He spontaneously converted to sinus, but presented to our office in atrial fibrillation with RVR and acute decompensated heart failure on 11/13/2019.  At this time he was started on increased dose of diuretics as well as amiodarone.   Patient presents for hospital follow-up.  He was admitted 08/18/2020 - 08/23/2020 for vertebral fracture following a fall.  During admission patient was treated with IV Lasix for acute on chronic diastolic heart failure and diuresed 12.5 L.  Patient is feeling improved overall since discharge, however he has had worsening bilateral lower leg edema.  He continues to take furosemide 40 mg p.o. twice daily.  On exam today he does have bilateral pitting edema.  Will switch him from furosemide 40 mg p.o.  twice daily to torsemide 40 mg p.o. twice daily and repeat BMP as well as BNP in 1 week.  Patient's EKG today reveals atrial fibrillation with controlled ventricular response.  This is his first known recurrence since 11/2019 at which time he was started on amiodarone.  We will plan to repeat EKG at next office visit.  Could consider ambulatory monitor to evaluate atrial fibrillation burden in the future if he remains in A. fib at next office visit.  Must consider that atrial fibrillation may have contributed to patient's fall prior to hospitalization.  Also recommend continued close follow-up with PCP for management of anemia as this is likely also contributing to patient's current presentation.  Patient will need SBE prophylaxis, in view of patient's allergy to amoxicillin, recommend azithromycin.  Follow-up in 4 weeks, sooner if needed, for atrial fibrillation and heart failure.    Alethia Berthold, PA-C 09/07/2020, 5:17 PM Office: [phone]

## 2020-09-08 DIAGNOSIS — D0359 Melanoma in situ of other part of trunk: Secondary | ICD-10-CM | POA: Diagnosis not present

## 2020-09-08 DIAGNOSIS — L905 Scar conditions and fibrosis of skin: Secondary | ICD-10-CM | POA: Diagnosis not present

## 2020-09-13 DIAGNOSIS — M8008XD Age-related osteoporosis with current pathological fracture, vertebra(e), subsequent encounter for fracture with routine healing: Secondary | ICD-10-CM | POA: Diagnosis not present

## 2020-09-13 DIAGNOSIS — L89611 Pressure ulcer of right heel, stage 1: Secondary | ICD-10-CM | POA: Diagnosis not present

## 2020-09-13 DIAGNOSIS — R338 Other retention of urine: Secondary | ICD-10-CM | POA: Diagnosis not present

## 2020-09-13 DIAGNOSIS — D631 Anemia in chronic kidney disease: Secondary | ICD-10-CM | POA: Diagnosis not present

## 2020-09-13 DIAGNOSIS — N39498 Other specified urinary incontinence: Secondary | ICD-10-CM | POA: Diagnosis not present

## 2020-09-13 DIAGNOSIS — S81802D Unspecified open wound, left lower leg, subsequent encounter: Secondary | ICD-10-CM | POA: Diagnosis not present

## 2020-09-13 DIAGNOSIS — N1831 Chronic kidney disease, stage 3a: Secondary | ICD-10-CM | POA: Diagnosis not present

## 2020-09-13 DIAGNOSIS — I480 Paroxysmal atrial fibrillation: Secondary | ICD-10-CM | POA: Diagnosis not present

## 2020-09-13 DIAGNOSIS — I5021 Acute systolic (congestive) heart failure: Secondary | ICD-10-CM | POA: Diagnosis not present

## 2020-09-13 DIAGNOSIS — I5033 Acute on chronic diastolic (congestive) heart failure: Secondary | ICD-10-CM | POA: Diagnosis not present

## 2020-09-13 DIAGNOSIS — E785 Hyperlipidemia, unspecified: Secondary | ICD-10-CM | POA: Diagnosis not present

## 2020-09-13 DIAGNOSIS — I712 Thoracic aortic aneurysm, without rupture, unspecified: Secondary | ICD-10-CM | POA: Diagnosis not present

## 2020-09-13 DIAGNOSIS — N401 Enlarged prostate with lower urinary tract symptoms: Secondary | ICD-10-CM | POA: Diagnosis not present

## 2020-09-13 DIAGNOSIS — I700 Atherosclerosis of aorta: Secondary | ICD-10-CM | POA: Diagnosis not present

## 2020-09-13 DIAGNOSIS — M353 Polymyalgia rheumatica: Secondary | ICD-10-CM | POA: Diagnosis not present

## 2020-09-13 DIAGNOSIS — I130 Hypertensive heart and chronic kidney disease with heart failure and stage 1 through stage 4 chronic kidney disease, or unspecified chronic kidney disease: Secondary | ICD-10-CM | POA: Diagnosis not present

## 2020-09-14 DIAGNOSIS — S41111A Laceration without foreign body of right upper arm, initial encounter: Secondary | ICD-10-CM | POA: Diagnosis not present

## 2020-09-14 DIAGNOSIS — I129 Hypertensive chronic kidney disease with stage 1 through stage 4 chronic kidney disease, or unspecified chronic kidney disease: Secondary | ICD-10-CM | POA: Diagnosis not present

## 2020-09-14 DIAGNOSIS — I480 Paroxysmal atrial fibrillation: Secondary | ICD-10-CM | POA: Diagnosis not present

## 2020-09-14 DIAGNOSIS — N179 Acute kidney failure, unspecified: Secondary | ICD-10-CM | POA: Diagnosis not present

## 2020-09-14 DIAGNOSIS — N1831 Chronic kidney disease, stage 3a: Secondary | ICD-10-CM | POA: Diagnosis not present

## 2020-09-15 DIAGNOSIS — R338 Other retention of urine: Secondary | ICD-10-CM | POA: Diagnosis not present

## 2020-09-16 ENCOUNTER — Other Ambulatory Visit: Payer: Self-pay

## 2020-09-16 ENCOUNTER — Telehealth: Payer: Self-pay | Admitting: Student

## 2020-09-16 MED ORDER — TORSEMIDE 20 MG PO TABS
40.0000 mg | ORAL_TABLET | Freq: Every day | ORAL | 1 refills | Status: DC
Start: 2020-09-16 — End: 2020-09-16

## 2020-09-16 MED ORDER — TORSEMIDE 20 MG PO TABS: 40.0000 mg | ORAL_TABLET | Freq: Every day | ORAL | 1 refills | Status: DC

## 2020-09-16 NOTE — Telephone Encounter (Signed)
Please call pharmacy and let them know I am fine with torsemide 20 mg 2 tablets at a time

## 2020-09-16 NOTE — Telephone Encounter (Signed)
John w/ [facility] was speaking to a MA this morning regarding a medication for this pt he was under the impression the MA was calling him back but instead another medication that wasn't needed was sent in. He is requesting a call back from a MA so he can get everything straightened out.   Call John @ [phone]

## 2020-09-16 NOTE — Telephone Encounter (Signed)
Sorry, meant to send this to [facility], not pcv clinical.

## 2020-09-16 NOTE — Telephone Encounter (Signed)
John w/[facility] pharmacy called about patient's prescription torsemide; says they don't carry it in 40 mg, so he wants to know if you would be fine with prescribing 20 mg take 2 tablets at a time or if you want to go with another form of that medication. Phone number is [phone].

## 2020-09-16 NOTE — Telephone Encounter (Signed)
Done

## 2020-09-19 DIAGNOSIS — D649 Anemia, unspecified: Secondary | ICD-10-CM | POA: Diagnosis not present

## 2020-09-20 NOTE — Progress Notes (Signed)
External labs 09/15/2020: BNP 184.3 Platelet 96, hemoglobin 8.9, hematocrit 27.1 Glucose 115, BUN 22, creatinine 1.6, GFR 40.6, sodium 143, potassium 4.4,

## 2020-09-21 ENCOUNTER — Other Ambulatory Visit: Payer: Self-pay

## 2020-09-21 ENCOUNTER — Encounter (HOSPITAL_BASED_OUTPATIENT_CLINIC_OR_DEPARTMENT_OTHER): Payer: Medicare Other | Attending: Physician Assistant | Admitting: Physician Assistant

## 2020-09-21 DIAGNOSIS — Z880 Allergy status to penicillin: Secondary | ICD-10-CM | POA: Insufficient documentation

## 2020-09-21 DIAGNOSIS — Z885 Allergy status to narcotic agent status: Secondary | ICD-10-CM | POA: Insufficient documentation

## 2020-09-21 DIAGNOSIS — I129 Hypertensive chronic kidney disease with stage 1 through stage 4 chronic kidney disease, or unspecified chronic kidney disease: Secondary | ICD-10-CM | POA: Insufficient documentation

## 2020-09-21 DIAGNOSIS — N183 Chronic kidney disease, stage 3 unspecified: Secondary | ICD-10-CM | POA: Insufficient documentation

## 2020-09-21 DIAGNOSIS — L89610 Pressure ulcer of right heel, unstageable: Secondary | ICD-10-CM | POA: Insufficient documentation

## 2020-09-21 DIAGNOSIS — Z91030 Bee allergy status: Secondary | ICD-10-CM | POA: Insufficient documentation

## 2020-09-21 NOTE — Progress Notes (Signed)
[name], [name] ([id number]) Visit Report for 09/21/2020 Allergy List Details Patient Name: Date of Service: [name] [name] ID I. 09/21/2020 12:30 PM Medical Record Number: [id number] Patient Account Number: [id number] Date of Birth/Sex: Treating RN: [birth year] (87 y.o. [name] [name] Primary Care Idora Brosious: [name] [name] Other Clinician: Referring Kahlen Boyde: Treating Lougenia Morrissey/Extender: [name] in Treatment: 0 Allergies Active Allergies bee venom protein (honey bee) amoxicillin morphine Allergy Notes Electronic Signature(s) Signed: 09/21/2020 5:58:14 PM By: Deon Pilling RN, BSN Entered By: Deon Pilling on 09/21/2020 12:47:56 -------------------------------------------------------------------------------- Arrival Information Details Patient Name: Date of Service: [name] [name], [name] 09/21/2020 12:30 PM Medical Record Number: [id number] Patient Account Number: [id number] Date of Birth/Sex: Treating RN: 1926/12/22 (87 y.o. [name] [name] Primary Care Yonathan Perrow: [name] [name] Other Clinician: Referring Everli Rother: Treating Clemens Lachman/Extender: Eli Hose in Treatment: 0 Visit Information Patient Arrived: Wheel Chair Arrival Time: 12:44 Accompanied By: wife Transfer Assistance: None Patient Identification Verified: Yes Secondary Verification Process Completed: Yes Patient Requires Transmission-Based Precautions: No Patient Has Alerts: Yes Patient Alerts: Patient on Blood Thinner Electronic Signature(s) Signed: 09/21/2020 5:58:14 PM By: Deon Pilling RN, BSN Entered By: Deon Pilling on 09/21/2020 12:47:12 -------------------------------------------------------------------------------- Clinic Level of Care Assessment Details Patient Name: Date of Service: [name], [name] 09/21/2020 12:30 PM Medical Record Number: [id number] Patient Account Number: [id number] Date of Birth/Sex: Treating RN: December 20, 1926 (87 y.o. [name] [name] Primary Care Jamilee Lafosse: [name] [name] Other Clinician: Referring [name] [name]: Treating [name] [name]/Extender: Eli Hose in Treatment: 0 Clinic Level of Care Assessment Items TOOL 1 Quantity Score X- 1 0 Use when EandM and Procedure is performed on INITIAL visit ASSESSMENTS - Nursing Assessment / Reassessment X- 1 20 General Physical Exam (combine w/ comprehensive assessment (listed just below) when performed on new pt. evals) X- 1 25 Comprehensive Assessment (HX, ROS, Risk Assessments, Wounds Hx, etc.) ASSESSMENTS - Wound and Skin Assessment / Reassessment X- 1 10 Dermatologic / Skin Assessment (not related to wound area) ASSESSMENTS - Ostomy and/or Continence Assessment and Care '[]'  - 0 Incontinence Assessment and Management '[]'  - 0 Ostomy Care Assessment and Management (repouching, etc.) PROCESS - Coordination of Care X - Simple Patient / Family Education for ongoing care 1 15 '[]'  - 0 Complex (extensive) Patient / Family Education for ongoing care X- 1 10 Staff obtains Programmer, systems, Records, T Results / Process Orders est '[]'  - 0 Staff telephones HHA, Nursing Homes / Clarify orders / etc '[]'  - 0 Routine Transfer to another Facility (non-emergent condition) '[]'  - 0 Routine Hospital Admission (non-emergent condition) X- 1 15 New Admissions / Biomedical engineer / Ordering NPWT Apligraf, etc. , '[]'  - 0 Emergency Hospital Admission (emergent condition) PROCESS - Special Needs '[]'  - 0 Pediatric / Minor Patient Management '[]'  - 0 Isolation Patient Management '[]'  - 0 Hearing / Language / Visual special needs '[]'  - 0 Assessment of Community assistance (transportation, D/C planning, etc.) '[]'  - 0 Additional assistance / Altered mentation '[]'  - 0 Support Surface(s) Assessment (bed, cushion, seat, etc.) INTERVENTIONS - Miscellaneous '[]'  - 0 External ear exam '[]'  - 0 Patient Transfer (multiple staff / Civil Service fast streamer / Similar devices) '[]'  - 0 Simple Staple /  Suture removal (25 or less) '[]'  - 0 Complex Staple / Suture removal (26 or more) '[]'  - 0 Hypo/Hyperglycemic Management (do not check if billed separately) X- 1 15 Ankle / Brachial Index (ABI) - do not check  if billed separately Has the patient been seen at the hospital within the last three years: Yes Total Score: 110 Level Of Care: New/Established - Level 3 Electronic Signature(s) Signed: 09/21/2020 5:58:14 PM By: Deon Pilling RN, BSN Signed: 09/21/2020 5:58:14 PM By: Deon Pilling RN, BSN Entered By: Deon Pilling on 09/21/2020 13:37:08 -------------------------------------------------------------------------------- Encounter Discharge Information Details Patient Name: Date of Service: [name] [name], [name] 09/21/2020 12:30 PM Medical Record Number: [id number] Patient Account Number: [id number] Date of Birth/Sex: Treating RN: Feb 12, 1926 (87 y.o. [name] [name] Primary Care Zinedine Ellner: [name] [name] Other Clinician: Referring Whit Bruni: Treating Marvelyn Bouchillon/Extender: Eli Hose in Treatment: 0 Encounter Discharge Information Items Post Procedure Vitals Discharge Condition: Stable Temperature (F): 97.6 Ambulatory Status: Wheelchair Pulse (bpm): 50 Discharge Destination: Home Respiratory Rate (breaths/min): 20 Transportation: Private Auto Blood Pressure (mmHg): 130/60 Accompanied By: wife Schedule Follow-up Appointment: Yes Clinical Summary of Care: Electronic Signature(s) Signed: 09/21/2020 5:58:14 PM By: Deon Pilling RN, BSN Entered By: Deon Pilling on 09/21/2020 13:50:19 -------------------------------------------------------------------------------- Lower Extremity Assessment Details Patient Name: Date of Service: [name] [name], [name] 09/21/2020 12:30 PM Medical Record Number: [id number] Patient Account Number: [id number] Date of Birth/Sex: Treating RN: 01/03/1926 (87 y.o. [name] [name], [name] [name] Primary Care Aaisha Sliter: [name] [name] Other  Clinician: Referring Diago Haik: Treating Kenadee Gates/Extender: [name] in Treatment: 0 Edema Assessment Assessed: [Left: No] [Right: Yes] Edema: [Left: Ye] [Right: s] Calf Left: Right: Point of Measurement: 34 cm From Medial Instep 42.5 cm Ankle Left: Right: Point of Measurement: 11 cm From Medial Instep 29 cm Knee To Floor Left: Right: From Medial Instep 49 cm Vascular Assessment Pulses: Dorsalis Pedis Palpable: [Right:Yes] Doppler Audible: [Right:Yes] Posterior Tibial Palpable: [Right:Yes] Doppler Audible: [Right:Yes] Blood Pressure: Brachial: [Right:119] Ankle: [Right:Dorsalis Pedis: 154 1.29] Electronic Signature(s) Signed: 09/21/2020 5:58:14 PM By: Deon Pilling RN, BSN Entered By: Deon Pilling on 09/21/2020 13:25:48 -------------------------------------------------------------------------------- Multi-Disciplinary Care Plan Details Patient Name: Date of Service: [name] [name], [name] 09/21/2020 12:30 PM Medical Record Number: [id number] Patient Account Number: [id number] Date of Birth/Sex: Treating RN: 1926/04/01 (87 y.o. [name] [name] Primary Care Milayna Rotenberg: [name] [name] Other Clinician: Referring Edwinna Rochette: Treating Benen Weida/Extender: Eli Hose in Treatment: 0 Active Inactive Orientation to the Wound Care Program Nursing Diagnoses: Knowledge deficit related to the wound healing center program Goals: Patient/caregiver will verbalize understanding of the [place] Program Date Initiated: 09/21/2020 Target Resolution Date: 10/07/2020 Goal Status: Active Interventions: Provide education on orientation to the wound center Notes: Pain, Acute or Chronic Nursing Diagnoses: Pain, acute or chronic: actual or potential Potential alteration in comfort, pain Goals: Patient will verbalize adequate pain control and receive pain control interventions during procedures as needed Date Initiated:  09/21/2020 Target Resolution Date: 10/07/2020 Goal Status: Active Patient/caregiver will verbalize comfort level met Date Initiated: 09/21/2020 Target Resolution Date: 10/06/2020 Goal Status: Active Interventions: Encourage patient to take pain medications as prescribed Provide education on pain management Treatment Activities: Administer pain control measures as ordered : 09/21/2020 Notes: Tissue Oxygenation Nursing Diagnoses: Potential alteration in peripheral tissue perfusion (select prior to confirmation of diagnosis) Goals: Non-invasive arterial studies are completed as ordered Date Initiated: 09/21/2020 Target Resolution Date: 09/21/2020 Goal Status: Active Revascularization procedures completed as ordered Date Initiated: 09/21/2020 Target Resolution Date: 09/30/2020 Goal Status: Active Interventions: Assess patient understanding of disease process and management upon diagnosis and as needed Provide education on tissue oxygenation and ischemia Treatment Activities: Ankle Brachial Index (  ABI) : 09/21/2020 Non-invasive vascular studies : 09/21/2020 T ordered outside of clinic : 09/21/2020 est Notes: Wound/Skin Impairment Nursing Diagnoses: Knowledge deficit related to ulceration/compromised skin integrity Goals: Patient/caregiver will verbalize understanding of skin care regimen Date Initiated: 09/21/2020 Target Resolution Date: 10/07/2020 Goal Status: Active Interventions: Assess patient/caregiver ability to obtain necessary supplies Assess patient/caregiver ability to perform ulcer/skin care regimen upon admission and as needed Provide education on ulcer and skin care Treatment Activities: Skin care regimen initiated : 09/21/2020 Topical wound management initiated : 09/21/2020 Notes: Electronic Signature(s) Signed: 09/21/2020 5:58:14 PM By: Deon Pilling RN, BSN Entered By: Deon Pilling on 09/21/2020  13:36:09 -------------------------------------------------------------------------------- Pain Assessment Details Patient Name: Date of Service: [name] [name], [name] 09/21/2020 12:30 PM Medical Record Number: [id number] Patient Account Number: [id number] Date of Birth/Sex: Treating RN: [birth year] (87 y.o. [name] [name] Primary Care Mir Fullilove: [name] [name] Other Clinician: Referring Francessca Friis: Treating Teneka Malmberg/Extender: Eli Hose in Treatment: 0 Active Problems Location of Pain Severity and Description of Pain Patient Has Paino No Site Locations Rate the pain. Rate the pain. Current Pain Level: 0 Pain Management and Medication Current Pain Management: Medication: No Cold Application: No Rest: No Massage: No Activity: No T.E.N.S.: No Heat Application: No Leg drop or elevation: No Is the Current Pain Management Adequate: Adequate How does your wound impact your activities of daily livingo Sleep: No Bathing: No Appetite: No Relationship With Others: No Bladder Continence: No Emotions: No Bowel Continence: No Work: No Toileting: No Drive: No Dressing: No Hobbies: No Engineer, maintenance) Signed: 09/21/2020 5:58:14 PM By: Deon Pilling RN, BSN Entered By: Deon Pilling on 09/21/2020 13:13:38 -------------------------------------------------------------------------------- Patient/Caregiver Education Details Patient Name: Date of Service: [name] [name] ID I. 9/21/2022andnbsp12:30 PM Medical Record Number: [id number] Patient Account Number: [id number] Date of Birth/Gender: Treating RN: [birth year] (87 y.o. [name] [name] Primary Care Physician: [name] [name] Other Clinician: Referring Physician: Treating Physician/Extender: Eli Hose in Treatment: 0 Education Assessment Education Provided To: Patient and Caregiver wife Education Topics Provided Welcome T [facility] [place]: o Handouts: Welcome T [facility]  [place] o Methods: Explain/Verbal, Printed Responses: Reinforcements needed Electronic Signature(s) Signed: 09/21/2020 5:58:14 PM By: Deon Pilling RN, BSN Entered By: Deon Pilling on 09/21/2020 13:36:38 -------------------------------------------------------------------------------- Wound Assessment Details Patient Name: Date of Service: [name] [name], [name] 09/21/2020 12:30 PM Medical Record Number: [id number] Patient Account Number: [id number] Date of Birth/Sex: Treating RN: 01-08-1926 (87 y.o. [name] [name] Primary Care Marquice Uddin: [name] [name] Other Clinician: Referring Aalani Aikens: Treating Zakayla Martinec/Extender: [name] in Treatment: 0 Wound Status Wound Number: 1 Primary Pressure Ulcer Etiology: Wound Location: Right, Lateral Calcaneus Wound Open Wounding Event: Pressure Injury Status: Date Acquired: 06/08/2020 Comorbid Cataracts, Sleep Apnea, Arrhythmia, Congestive Heart Failure, Weeks Of Treatment: 0 History: Coronary Artery Disease, Hypertension, Myocardial Infarction, Clustered Wound: No End Stage Renal Disease, Gout, Dementia, Neuropathy Photos Wound Measurements Length: (cm) 0.7 Width: (cm) 0.9 Depth: (cm) 0.1 Area: (cm) 0.495 Volume: (cm) 0.049 % Reduction in Area: 0% % Reduction in Volume: 0% Epithelialization: Small (1-33%) Tunneling: No Undermining: No Wound Description Classification: Unstageable/Unclassified Wound Margin: Distinct, outline attached Exudate Amount: Medium Exudate Type: Serosanguineous Exudate Color: red, brown Foul Odor After Cleansing: No Slough/Fibrino Yes Wound Bed Granulation Amount: None Present (0%) Exposed Structure Necrotic Amount: Large (67-100%) Fascia Exposed: No Necrotic Quality: Adherent Slough Fat Layer (Subcutaneous Tissue) Exposed: No Tendon Exposed: No Muscle Exposed: No  Joint Exposed: No Bone Exposed: No Treatment Notes Wound #1 (Calcaneus) Wound Laterality: Right,  Lateral Cleanser Soap and Water Discharge Instruction: May shower and wash wound with dial antibacterial soap and water prior to dressing change. Peri-Wound Care Skin Prep Discharge Instruction: Use skin prep as directed Topical Primary Dressing Promogran Prisma Matrix, 4.34 (sq in) (silver collagen) Discharge Instruction: Moisten collagen with saline or hydrogel Secondary Dressing Zetuvit Plus Silicone Border Dressing 4x4 (in/in) Discharge Instruction: Apply silicone border over primary dressing as directed. Secured With Compression Wrap Compression Stockings Environmental education officer) Signed: 09/21/2020 5:58:14 PM By: Deon Pilling RN, BSN Entered By: Deon Pilling on 09/21/2020 13:11:23 -------------------------------------------------------------------------------- Vitals Details Patient Name: Date of Service: [name] [name], [name] 09/21/2020 12:30 PM Medical Record Number: [id number] Patient Account Number: [id number] Date of Birth/Sex: Treating RN: 05/28/1926 (87 y.o. [name] [name], [name] [name] Primary Care Jamal Haskin: [name] [name] Other Clinician: Referring Irvan Tiedt: Treating Tallia Moehring/Extender: Eli Hose in Treatment: 0 Vital Signs Time Taken: 12:45 Temperature (F): 97.6 Height (in): 72 Pulse (bpm): 50 Source: Stated Respiratory Rate (breaths/min): 20 Weight (lbs): 223 Blood Pressure (mmHg): 130/60 Source: Stated Reference Range: 80 - 120 mg / dl Body Mass Index (BMI): 30.2 Electronic Signature(s) Signed: 09/21/2020 5:58:14 PM By: Deon Pilling RN, BSN Entered By: Deon Pilling on 09/21/2020 12:54:24

## 2020-09-21 NOTE — Progress Notes (Signed)
[name], [name] I. ([id number]) Visit Report for 09/21/2020 Abuse/Suicide Risk Screen Details Patient Name: Date of Service: [name] [name] ID I. 09/21/2020 12:30 PM Medical Record Number: [id number] Patient Account Number: [id number] Date of Birth/Sex: Treating RN: 1926/10/04 (87 y.o. [name] [name] Primary Care Netanya Yazdani: [name] Other Clinician: Referring Earl Losee: Treating Erhard Senske/Extender: [name] in Treatment: 0 Abuse/Suicide Risk Screen Items Answer ABUSE RISK SCREEN: Has anyone close to you tried to hurt or harm you recentlyo No Do you feel uncomfortable with anyone in your familyo No Has anyone forced you do things that you didnt want to doo No Electronic Signature(s) Signed: 09/21/2020 5:58:14 PM By: Deon Pilling RN, BSN Entered By: Deon Pilling on 09/21/2020 12:49:27 -------------------------------------------------------------------------------- Activities of Daily Living Details Patient Name: Date of Service: [name] [name] ID I. 09/21/2020 12:30 PM Medical Record Number: [id number] Patient Account Number: [id number] Date of Birth/Sex: Treating RN: 11-Jul-1926 (87 y.o. [name] [name] Primary Care Jahrel Borthwick: [name] Other Clinician: Referring Rihan Schueler: Treating Jaima Janney/Extender: Eli Hose in Treatment: 0 Activities of Daily Living Items Answer Activities of Daily Living (Please select one for each item) Drive Automobile Not Able T Medications ake Completely Able Use T elephone Completely Able Care for Appearance Need Assistance Use T oilet Need Assistance Bath / Shower Need Assistance Dress Self Need Assistance Feed Self Completely Able Walk Need Assistance Get In / Out Bed Need Assistance Housework Not Able Prepare Meals Not [place] for Self Not Able Electronic Signature(s) Signed: 09/21/2020 5:58:14 PM By: Deon Pilling RN, BSN Entered By: Deon Pilling on  09/21/2020 12:51:28 -------------------------------------------------------------------------------- Education Screening Details Patient Name: Date of Service: [name] [name], [name] 09/21/2020 12:30 PM Medical Record Number: [id number] Patient Account Number: [id number] Date of Birth/Sex: Treating RN: [birth year] (87 y.o. [name] [name] Primary Care Aldeen Riga: [name] Other Clinician: Referring Anabela Crayton: Treating Liliana Dang/Extender: Eli Hose in Treatment: 0 Primary Learner Assessed: Patient Learning Preferences/Education Level/Primary Language Learning Preference: Explanation, Demonstration, Printed Material Preferred Language: English Cognitive Barrier Language Barrier: No Translator Needed: No Memory Deficit: No Emotional Barrier: No Physical Barrier Impaired Hearing: Yes Hearing Aid Decreased Hand dexterity: No Knowledge/Comprehension Knowledge Level: High Comprehension Level: High Ability to understand written instructions: High Ability to understand verbal instructions: High Motivation Anxiety Level: Calm Cooperation: Cooperative Education Importance: Acknowledges Need Interest in Health Problems: Asks Questions Perception: Coherent Willingness to Engage in Self-Management High Activities: Readiness to Engage in Self-Management High Activities: Electronic Signature(s) Signed: 09/21/2020 5:58:14 PM By: Deon Pilling RN, BSN Entered By: Deon Pilling on 09/21/2020 12:53:48 -------------------------------------------------------------------------------- Fall Risk Assessment Details Patient Name: Date of Service: [name], [name] 09/21/2020 12:30 PM Medical Record Number: [id number] Patient Account Number: [id number] Date of Birth/Sex: Treating RN: [birth year] (87 y.o. [name] [name] Primary Care Saniyya Gau: [name] Other Clinician: Referring Lyza Houseworth: Treating Markeia Harkless/Extender: Eli Hose in  Treatment: 0 Fall Risk Assessment Items Have you had 2 or more falls in the last 12 monthso 0 Yes Have you had any fall that resulted in injury in the last 12 monthso 0 Yes FALLS RISK SCREEN History of falling - immediate or within 3 months 25 Yes Secondary diagnosis (Do you have 2 or more medical diagnoseso) 0 No Ambulatory aid None/bed rest/wheelchair/nurse 0 Yes Crutches/cane/walker 15 Yes Furniture 0 No Intravenous therapy Access/Saline/Heparin Lock 0 No Gait/Transferring Normal/ bed  rest/ wheelchair 0 No Weak (short steps with or without shuffle, stooped but able to lift head while walking, may seek 10 Yes support from furniture) Impaired (short steps with shuffle, may have difficulty arising from chair, head down, impaired 0 No balance) Mental Status Oriented to own ability 0 Yes Electronic Signature(s) Signed: 09/21/2020 5:58:14 PM By: Deon Pilling RN, BSN Entered By: Deon Pilling on 09/21/2020 12:54:08 -------------------------------------------------------------------------------- Foot Assessment Details Patient Name: Date of Service: [name] [name], [name] 09/21/2020 12:30 PM Medical Record Number: [id number] Patient Account Number: [id number] Date of Birth/Sex: Treating RN: May 19, 1926 (87 y.o. [name] [name] Primary Care Hildagard Sobecki: [name] Other Clinician: Referring Truda Staub: Treating Sabian Kuba/Extender: [name] in Treatment: 0 Foot Assessment Items Site Locations + = Sensation present, - = Sensation absent, C = Callus, U = Ulcer R = Redness, W = Warmth, M = Maceration, PU = Pre-ulcerative lesion F = Fissure, S = Swelling, D = Dryness Assessment Right: Left: Other Deformity: No No Prior Foot Ulcer: No No Prior Amputation: No No Charcot Joint: No No Ambulatory Status: Ambulatory With Help Assistance Device: Walker Gait: Administrator, arts) Signed: 09/21/2020 5:58:14 PM By: Deon Pilling RN, BSN Entered By:  Deon Pilling on 09/21/2020 13:22:12 -------------------------------------------------------------------------------- Nutrition Risk Screening Details Patient Name: Date of Service: [name] [name], [name] 09/21/2020 12:30 PM Medical Record Number: [id number] Patient Account Number: [id number] Date of Birth/Sex: Treating RN: 07/01/1926 (87 y.o. [name] [name] Primary Care Lailani Tool: [name] Other Clinician: Referring Doree Kuehne: Treating Raelan Burgoon/Extender: [name] in Treatment: 0 Height (in): Weight (lbs): Body Mass Index (BMI): Nutrition Risk Screening Items Score Screening NUTRITION RISK SCREEN: I have an illness or condition that made me change the kind and/or amount of food I eat 2 Yes I eat fewer than two meals per day 0 No I eat few fruits and vegetables, or milk products 0 No I have three or more drinks of beer, liquor or wine almost every day 0 No I have tooth or mouth problems that make it hard for me to eat 0 No I don't always have enough money to buy the food I need 0 No I eat alone most of the time 0 No I take three or more different prescribed or over-the-counter drugs a day 1 Yes Without wanting to, I have lost or gained 10 pounds in the last six months 0 No I am not always physically able to shop, cook and/or feed myself 2 Yes Nutrition Protocols Good Risk Protocol Moderate Risk Protocol 0 Provide education on nutrition High Risk Proctocol Risk Level: Moderate Risk Score: 5 Electronic Signature(s) Signed: 09/21/2020 5:58:14 PM By: Deon Pilling RN, BSN Entered By: Deon Pilling on 09/21/2020 12:54:34

## 2020-09-22 NOTE — Progress Notes (Signed)
OIEN, Abem I. ([id number]) Visit Report for 09/21/2020 Chief Complaint Document Details Patient Name: Date of Service: [name] [name] ID I. 09/21/2020 12:30 PM Medical Record Number: [id number] Patient Account Number: [id number] Date of Birth/Sex: Treating RN: May 02, 1926 (87 y.o. Male) Baruch Gouty Primary Care Provider: [name] Other Clinician: Referring Provider: Treating Provider/Extender: Fran Lowes Weeks in Treatment: 0 Information Obtained from: Patient Chief Complaint Pressure ulcer right heel Electronic Signature(s) Signed: 09/21/2020 1:33:52 PM By: [name] [name] [name]-C Entered By: [name] on 09/21/2020 13:33:52 -------------------------------------------------------------------------------- Debridement Details Patient Name: Date of Service: [name] [name], [name] V ID I. 09/21/2020 12:30 PM Medical Record Number: [id number] Patient Account Number: [id number] Date of Birth/Sex: Treating RN: March 24, 1926 (87 y.o. Male) Deon Pilling Primary Care Provider: [name] Other Clinician: Referring Provider: Treating Provider/Extender: Fran Lowes Weeks in Treatment: 0 Debridement Performed for Assessment: Wound #1 Right,Lateral Calcaneus Performed By: Physician [name], [name] [name] Debridement Type: Debridement Level of Consciousness (Pre-procedure): Awake and Alert Pre-procedure Verification/Time Out Yes - 13:30 Taken: Start Time: 13:31 Pain Control: Lidocaine 4% T opical Solution T Area Debrided (L x W): otal 0.7 (cm) x 1 (cm) = 0.7 (cm) Tissue and other material debrided: Viable, Non-Viable, Slough, Subcutaneous, Skin: Dermis , Skin: Epidermis, Fibrin/Exudate, Slough Level: Skin/Subcutaneous Tissue Debridement Description: Excisional Instrument: Curette Bleeding: Minimum Hemostasis Achieved: Pressure End Time: 13:38 Procedural Pain: 0 Post Procedural Pain: 0 Response to Treatment: Procedure was tolerated well Level of  Consciousness (Post- Awake and Alert procedure): Post Debridement Measurements of Total Wound Length: (cm) 0.7 Stage: Unstageable/Unclassified Width: (cm) 0.9 Depth: (cm) 0.1 Volume: (cm) 0.049 Character of Wound/Ulcer Post Debridement: Requires Further Debridement Post Procedure Diagnosis Same as Pre-procedure Electronic Signature(s) Signed: 09/21/2020 5:58:14 PM By: Deon Pilling RN, BSN Signed: 09/22/2020 2:21:00 PM By: [name] [name] [name]-C Entered By: Deon Pilling on 09/21/2020 13:38:41 -------------------------------------------------------------------------------- HPI Details Patient Name: Date of Service: [name] [name], [name] V ID I. 09/21/2020 12:30 PM Medical Record Number: [id number] Patient Account Number: [id number] Date of Birth/Sex: Treating RN: 09-Aug-1926 (87 y.o. Male) Baruch Gouty Primary Care Provider: [name] Other Clinician: Referring Provider: Treating Provider/Extender: Fran Lowes Weeks in Treatment: 0 History of Present Illness HPI Description: 09/21/2020 upon evaluation today patient appears to be doing somewhat poorly in regard to a wound in the right lateral heel location. This fortunately does not appear to be too bad but nonetheless is definitely a spot that we need to work on try to get healed for him. Has been here for quite some time. Fortunately there does not appear to be any evidence of active infection at this time which is great news. With that being said I do believe that he has some evidence here of peripheral vascular disease this is minimal his ABI 0.87. Other than that he does have a history of BPH, chronic kidney disease stage III, and hypertension. Currently he has been using different medication such as Silvadene or antibiotic ointment to try to help treat things. This just is not helping out as efficiently as it should be. Of note patient's history includes that of a triple bypass in 2010, a kyphoplasty at L1 which was done  8 months ago, unfortunately he had a fracture 2 months ago which she sustained a T7/T8 fracture to his spine which ended up with him being in the hospital. It is in that time since then that he developed this wound roughly 2 to 3 months  ago is not exactly sure when. He does typically wear compression socks in the 20 to 30 mmHg range. Electronic Signature(s) Signed: 09/22/2020 2:18:30 PM By: [name] [name] [name]-C Previous Signature: 09/21/2020 1:51:21 PM Version By: [name] [name] [name]-C Entered By: [name] on 09/22/2020 14:18:30 -------------------------------------------------------------------------------- Physical Exam Details Patient Name: Date of Service: [name] [name] ID I. 09/21/2020 12:30 PM Medical Record Number: [id number] Patient Account Number: [id number] Date of Birth/Sex: Treating RN: 10/01/1926 (87 y.o. Male) Baruch Gouty Primary Care Provider: [name] Other Clinician: Referring Provider: Treating Provider/Extender: Fran Lowes Weeks in Treatment: 0 Constitutional patient is hypertensive.. pulse regular and within target range for patient.Marland Kitchen respirations regular, non-labored and within target range for patient.Marland Kitchen temperature within target range for patient.. Well-nourished and well-hydrated in no acute distress. Eyes conjunctiva clear no eyelid edema noted. pupils equal round and reactive to light and accommodation. Ears, Nose, Mouth, and Throat no gross abnormality of ear auricles or external auditory canals. normal hearing noted during conversation. mucus membranes moist. Respiratory normal breathing without difficulty. Cardiovascular 2+ dorsalis pedis/posterior tibialis pulses. no clubbing, cyanosis, significant edema, <3 sec cap refill. Musculoskeletal Patient unable to walk without assistance. no significant deformity or arthritic changes, no loss or range of motion, no clubbing. Psychiatric this patient is able to make decisions and  demonstrates good insight into disease process. Alert and Oriented x 3. pleasant and cooperative. Notes Upon inspection patient's wound bed fortunately does not appear to be doing too poorly as far as the overall depth of the wound it is can require some sharp debridement to clear away the necrotic debris and that was discussed with the patient today. He tolerated that debridement without complication and postdebridement the wound bed appears to be doing much better which is great news. Overall I am happy in that regard. Electronic Signature(s) Signed: 09/22/2020 2:19:02 PM By: [name] [name] [name]-C Entered By: [name] on 09/22/2020 14:19:01 -------------------------------------------------------------------------------- Physician Orders Details Patient Name: Date of Service: [name] [name], [name] V ID I. 09/21/2020 12:30 PM Medical Record Number: [id number] Patient Account Number: [id number] Date of Birth/Sex: Treating RN: Jan 21, 1926 (87 y.o. Male) Deon Pilling Primary Care Provider: [name] Other Clinician: Referring Provider: Treating Provider/Extender: Crissie Figures in Treatment: 0 Verbal / Phone Orders: No Diagnosis Coding ICD-10 Coding Code Description L89.610 Pressure ulcer of right heel, unstageable I73.89 Other specified peripheral vascular diseases N40.1 Benign prostatic hyperplasia with lower urinary tract symptoms N18.30 Chronic kidney disease, stage 3 unspecified I10 Essential (primary) hypertension Follow-up Appointments ppointment in 1 week. [name] Cos, [name] [name] Return A Other: - [place] wound care supplies [employer] May shower and wash wound with soap and water. - with dressing changes only. Edema Control - Lymphedema / SCD / Other Elevate legs to the level of the heart or above for 30 minutes daily and/or when sitting, a frequency of: - 3-4 times a day through out the day. Avoid standing for long periods of time. Moisturize legs  daily. - both legs every night before bed. Off-Loading Other: - Ensure to float right heel while resting in bed or chair with bunny boots. No not wear sandals to prevent friction. Ensure to wear tennis shoes. Non Wound Condition Protect area with: - dermasaver sleeves for protection of skin to both arms. [email or web address] Wound Treatment Wound #1 - Calcaneus Wound Laterality: Right, Lateral Cleanser: Soap and Water Every Other Day/30 Days Discharge Instructions: May shower and wash wound  with dial antibacterial soap and water prior to dressing change. Peri-Wound Care: Skin Prep (DME) (Generic) Every Other Day/30 Days Discharge Instructions: Use skin prep as directed Prim Dressing: Promogran Prisma Matrix, 4.34 (sq in) (silver collagen) (DME) (Generic) Every Other Day/30 Days ary Discharge Instructions: Moisten collagen with saline or hydrogel Secondary Dressing: Zetuvit Plus Silicone Border Dressing 4x4 (in/in) (DME) (Generic) Every Other Day/30 Days Discharge Instructions: Apply silicone border over primary dressing as directed. Electronic Signature(s) Signed: 09/21/2020 5:58:14 PM By: Deon Pilling RN, BSN Signed: 09/22/2020 2:21:00 PM By: [name] [name] [name]-C Entered By: Deon Pilling on 09/21/2020 13:47:24 -------------------------------------------------------------------------------- Problem List Details Patient Name: Date of Service: [name] [name], [name] V ID I. 09/21/2020 12:30 PM Medical Record Number: [id number] Patient Account Number: [id number] Date of Birth/Sex: Treating RN: 1926/12/12 (87 y.o. Male) Baruch Gouty Primary Care Provider: [name] Other Clinician: Referring Provider: Treating Provider/Extender: Fran Lowes Weeks in Treatment: 0 Active Problems ICD-10 Encounter Code Description Active Date MDM Diagnosis L89.610 Pressure ulcer of right heel, unstageable 09/21/2020 No Yes I73.89 Other specified peripheral vascular diseases 09/21/2020 No  Yes N40.1 Benign prostatic hyperplasia with lower urinary tract symptoms 09/21/2020 No Yes N18.30 Chronic kidney disease, stage 3 unspecified 09/21/2020 No Yes I10 Essential (primary) hypertension 09/21/2020 No Yes Inactive Problems Resolved Problems Electronic Signature(s) Signed: 09/21/2020 1:33:18 PM By: [name] [name] [name]-C Entered By: [name] on 09/21/2020 13:33:17 -------------------------------------------------------------------------------- Progress Note Details Patient Name: Date of Service: [name] [name], [name] V ID I. 09/21/2020 12:30 PM Medical Record Number: [id number] Patient Account Number: [id number] Date of Birth/Sex: Treating RN: June 13, 1926 (87 y.o. Male) Baruch Gouty Primary Care Provider: [name] Other Clinician: Referring Provider: Treating Provider/Extender: Fran Lowes Weeks in Treatment: 0 Subjective Chief Complaint Information obtained from Patient Pressure ulcer right heel History of Present Illness (HPI) 09/21/2020 upon evaluation today patient appears to be doing somewhat poorly in regard to a wound in the right lateral heel location. This fortunately does not appear to be too bad but nonetheless is definitely a spot that we need to work on try to get healed for him. Has been here for quite some time. Fortunately there does not appear to be any evidence of active infection at this time which is great news. With that being said I do believe that he has some evidence here of peripheral vascular disease this is minimal his ABI 0.87. Other than that he does have a history of BPH, chronic kidney disease stage III, and hypertension. Currently he has been using different medication such as Silvadene or antibiotic ointment to try to help treat things. This just is not helping out as efficiently as it should be. Of note patient's history includes that of a triple bypass in 2010, a kyphoplasty at L1 which was done 8 months ago, unfortunately he  had a fracture 2 months ago which she sustained a T7/T8 fracture to his spine which ended up with him being in the hospital. It is in that time since then that he developed this wound roughly 2 to 3 months ago is not exactly sure when. He does typically wear compression socks in the 20 to 30 mmHg range. Patient History Information obtained from Patient, Chart. Allergies bee venom protein (honey bee), amoxicillin, morphine Family History No family history of Cancer, Diabetes, Heart Disease, Hereditary Spherocytosis, Hypertension, Kidney Disease, Lung Disease, Seizures, Stroke, Thyroid Problems, Tuberculosis. Social History Former smoker - quit 1964, Marital Status - Married, Alcohol Use -  Daily, Drug Use - No History, Caffeine Use - Daily - coffee. Medical History Eyes Patient has history of Cataracts - 1995 Denies history of Glaucoma, Optic Neuritis Ear/Nose/Mouth/Throat Denies history of Chronic sinus problems/congestion, Middle ear problems Hematologic/Lymphatic Denies history of Anemia, Hemophilia, Human Immunodeficiency Virus, Lymphedema, Sickle Cell Disease Respiratory Patient has history of Sleep Apnea - CPAP Denies history of Aspiration, Asthma, Chronic Obstructive Pulmonary Disease (COPD), Pneumothorax, Tuberculosis Cardiovascular Patient has history of Arrhythmia - A. Fib, Congestive Heart Failure, Coronary Artery Disease, Hypertension, Myocardial Infarction - 2010 Denies history of Angina, Deep Vein Thrombosis, Hypotension, Peripheral Arterial Disease, Peripheral Venous Disease, Phlebitis, Vasculitis Gastrointestinal Denies history of Cirrhosis , Colitis, Crohnoos, Hepatitis A, Hepatitis B, Hepatitis C Endocrine Denies history of Type I Diabetes, Type II Diabetes Genitourinary Patient has history of End Stage Renal Disease - stage III Immunological Denies history of Lupus Erythematosus, Raynaudoos, Scleroderma Integumentary (Skin) Denies history of History of  Burn Musculoskeletal Patient has history of Gout Denies history of Rheumatoid Arthritis, Osteoarthritis, Osteomyelitis Neurologic Patient has history of Dementia, Neuropathy Denies history of Quadriplegia, Paraplegia, Seizure Disorder Oncologic Denies history of Received Chemotherapy, Received Radiation Psychiatric Denies history of Anorexia/bulimia, Confinement Anxiety Hospitalization/Surgery History - 11/2020 Aortic valve replacement. - triple BYpass 2010. - FAll L1 vertebra surgery 8 months ago. - Fall 69months ago T7-T8. - vastectomy 1972. - partial prostate removal 2008. Medical A Surgical History Notes nd Ear/Nose/Mouth/Throat HOH- hearing aids Genitourinary foley catheter unable to urinate x2 months ago Oncologic melanoma skin Ca removal x2 weeks ago back Review of Systems (ROS) Constitutional Symptoms (General Health) Denies complaints or symptoms of Fatigue, Fever, Chills, Marked Weight Change. Eyes Denies complaints or symptoms of Dry Eyes, Vision Changes, Glasses / Contacts. Ear/Nose/Mouth/Throat Denies complaints or symptoms of Chronic sinus problems or rhinitis. Respiratory Denies complaints or symptoms of Chronic or frequent coughs, Shortness of Breath. Cardiovascular Denies complaints or symptoms of Chest pain. Gastrointestinal Denies complaints or symptoms of Frequent diarrhea, Nausea, Vomiting. Endocrine Denies complaints or symptoms of Heat/cold intolerance. Genitourinary Denies complaints or symptoms of Frequent urination. Integumentary (Skin) Complains or has symptoms of Wounds - right heel. Musculoskeletal Denies complaints or symptoms of Muscle Pain, Muscle Weakness. Neurologic Denies complaints or symptoms of Numbness/parasthesias. Psychiatric Denies complaints or symptoms of Claustrophobia, Suicidal. Objective Constitutional patient is hypertensive.. pulse regular and within target range for patient.Marland Kitchen respirations regular, non-labored and within  target range for patient.Marland Kitchen temperature within target range for patient.. Well-nourished and well-hydrated in no acute distress. Vitals Time Taken: 12:45 PM, Height: 72 in, Source: Stated, Weight: 223 lbs, Source: Stated, BMI: 30.2, Temperature: 97.6 F, Pulse: 50 bpm, Respiratory Rate: 20 breaths/min, Blood Pressure: 130/60 mmHg. Eyes conjunctiva clear no eyelid edema noted. pupils equal round and reactive to light and accommodation. Ears, Nose, Mouth, and Throat no gross abnormality of ear auricles or external auditory canals. normal hearing noted during conversation. mucus membranes moist. Respiratory normal breathing without difficulty. Cardiovascular 2+ dorsalis pedis/posterior tibialis pulses. no clubbing, cyanosis, significant edema, Musculoskeletal Patient unable to walk without assistance. no significant deformity or arthritic changes, no loss or range of motion, no clubbing. Psychiatric this patient is able to make decisions and demonstrates good insight into disease process. Alert and Oriented x 3. pleasant and cooperative. General Notes: Upon inspection patient's wound bed fortunately does not appear to be doing too poorly as far as the overall depth of the wound it is can require some sharp debridement to clear away the necrotic debris and that was discussed with the patient  today. He tolerated that debridement without complication and postdebridement the wound bed appears to be doing much better which is great news. Overall I am happy in that regard. Integumentary (Hair, Skin) Wound #1 status is Open. Original cause of wound was Pressure Injury. The date acquired was: 06/08/2020. The wound is located on the Right,Lateral Calcaneus. The wound measures 0.7cm length x 0.9cm width x 0.1cm depth; 0.495cm^2 area and 0.049cm^3 volume. There is no tunneling or undermining noted. There is a medium amount of serosanguineous drainage noted. The wound margin is distinct with the outline  attached to the wound base. There is no granulation within the wound bed. There is a large (67-100%) amount of necrotic tissue within the wound bed including Adherent Slough. Assessment Active Problems ICD-10 Pressure ulcer of right heel, unstageable Other specified peripheral vascular diseases Benign prostatic hyperplasia with lower urinary tract symptoms Chronic kidney disease, stage 3 unspecified Essential (primary) hypertension Procedures Wound #1 Pre-procedure diagnosis of Wound #1 is a Pressure Ulcer located on the Right,Lateral Calcaneus . There was a Excisional Skin/Subcutaneous Tissue Debridement with a total area of 0.7 sq cm performed by [name], [name] [name]. With the following instrument(s): Curette to remove Viable and Non-Viable tissue/material. Material removed includes Subcutaneous Tissue, Slough, Skin: Dermis, Skin: Epidermis, and Fibrin/Exudate after achieving pain control using Lidocaine 4% T opical Solution. A time out was conducted at 13:30, prior to the start of the procedure. A Minimum amount of bleeding was controlled with Pressure. The procedure was tolerated well with a pain level of 0 throughout and a pain level of 0 following the procedure. Post Debridement Measurements: 0.7cm length x 0.9cm width x 0.1cm depth; 0.049cm^3 volume. Post debridement Stage noted as Unstageable/Unclassified. Character of Wound/Ulcer Post Debridement requires further debridement. Post procedure Diagnosis Wound #1: Same as Pre-Procedure Plan Follow-up Appointments: Return Appointment in 1 week. [name] Cos, [name] [name] Other: [employer] wound care supplies Bathing/ Shower/ Hygiene: May shower and wash wound with soap and water. - with dressing changes only. Edema Control - Lymphedema / SCD / Other: Elevate legs to the level of the heart or above for 30 minutes daily and/or when sitting, a frequency of: - 3-4 times a day through out the day. Avoid standing for long periods of time. Moisturize  legs daily. - both legs every night before bed. Off-Loading: Other: - Ensure to float right heel while resting in bed or chair with bunny boots. No not wear sandals to prevent friction. Ensure to wear tennis shoes. Non Wound Condition: Protect area with: - dermasaver sleeves for protection of skin to both arms. [email or web address] WOUND #1: - Calcaneus Wound Laterality: Right, Lateral Cleanser: Soap and Water Every Other Day/30 Days Discharge Instructions: May shower and wash wound with dial antibacterial soap and water prior to dressing change. Peri-Wound Care: Skin Prep (DME) (Generic) Every Other Day/30 Days Discharge Instructions: Use skin prep as directed Prim Dressing: Promogran Prisma Matrix, 4.34 (sq in) (silver collagen) (DME) (Generic) Every Other Day/30 Days ary Discharge Instructions: Moisten collagen with saline or hydrogel Secondary Dressing: Zetuvit Plus Silicone Border Dressing 4x4 (in/in) (DME) (Generic) Every Other Day/30 Days Discharge Instructions: Apply silicone border over primary dressing as directed. 1. Based on what I am seeing currently I think that this patient would benefit from [place] going ahead and initiating treatment today with a silver collagen dressing especially after cleaning off the wound bed I think the surface is doing much better and the patient in general seems to be doing much better.  2. We will get a continue to recommend covering this with a Zetuvit border foam dressing for padding I think this will do well as well for him. 3. I would also suggest that we have him continue to attempt to offload this. He does not have a whole lot he can do being so weak at the moment and therefore I think keeping the pressure off of the heel is important when he sitting or otherwise fortunately it sounds like in his chair this happens in the bed we will not sure that is quite as effective we discussed options to try to offload today as well hopefully preventing this from being  an ongoing issue. We will see patient back for reevaluation in 1 week here in the clinic. If anything worsens or changes patient will contact our office for additional recommendations. Electronic Signature(s) Signed: 09/22/2020 2:20:08 PM By: [name] [name] [name]-C Entered By: [name] on 09/22/2020 14:20:07 -------------------------------------------------------------------------------- HxROS Details Patient Name: Date of Service: [name] [name], [name] V ID I. 09/21/2020 12:30 PM Medical Record Number: [id number] Patient Account Number: [id number] Date of Birth/Sex: Treating RN: October 22, 1926 (87 y.o. Male) Deon Pilling Primary Care Provider: [name] Other Clinician: Referring Provider: Treating Provider/Extender: Fran Lowes Weeks in Treatment: 0 Information Obtained From Patient Chart Constitutional Symptoms (General Health) Complaints and Symptoms: Negative for: Fatigue; Fever; Chills; Marked Weight Change Eyes Complaints and Symptoms: Negative for: Dry Eyes; Vision Changes; Glasses / Contacts Medical History: Positive for: Cataracts - 1995 Negative for: Glaucoma; Optic Neuritis Ear/Nose/Mouth/Throat Complaints and Symptoms: Negative for: Chronic sinus problems or rhinitis Medical History: Negative for: Chronic sinus problems/congestion; Middle ear problems Past Medical History Notes: HOH- hearing aids Respiratory Complaints and Symptoms: Negative for: Chronic or frequent coughs; Shortness of Breath Medical History: Positive for: Sleep Apnea - CPAP Negative for: Aspiration; Asthma; Chronic Obstructive Pulmonary Disease (COPD); Pneumothorax; Tuberculosis Cardiovascular Complaints and Symptoms: Negative for: Chest pain Medical History: Positive for: Arrhythmia - A. Fib; Congestive Heart Failure; Coronary Artery Disease; Hypertension; Myocardial Infarction - 2010 Negative for: Angina; Deep Vein Thrombosis; Hypotension; Peripheral Arterial Disease;  Peripheral Venous Disease; Phlebitis; Vasculitis Gastrointestinal Complaints and Symptoms: Negative for: Frequent diarrhea; Nausea; Vomiting Medical History: Negative for: Cirrhosis ; Colitis; Crohns; Hepatitis A; Hepatitis B; Hepatitis C Endocrine Complaints and Symptoms: Negative for: Heat/cold intolerance Medical History: Negative for: Type I Diabetes; Type II Diabetes Genitourinary Complaints and Symptoms: Negative for: Frequent urination Medical History: Positive for: End Stage Renal Disease - stage III Past Medical History Notes: foley catheter unable to urinate x2 months ago Integumentary (Skin) Complaints and Symptoms: Positive for: Wounds - right heel Medical History: Negative for: History of Burn Musculoskeletal Complaints and Symptoms: Negative for: Muscle Pain; Muscle Weakness Medical History: Positive for: Gout Negative for: Rheumatoid Arthritis; Osteoarthritis; Osteomyelitis Neurologic Complaints and Symptoms: Negative for: Numbness/parasthesias Medical History: Positive for: Dementia; Neuropathy Negative for: Quadriplegia; Paraplegia; Seizure Disorder Psychiatric Complaints and Symptoms: Negative for: Claustrophobia; Suicidal Medical History: Negative for: Anorexia/bulimia; Confinement Anxiety Hematologic/Lymphatic Medical History: Negative for: Anemia; Hemophilia; Human Immunodeficiency Virus; Lymphedema; Sickle Cell Disease Immunological Medical History: Negative for: Lupus Erythematosus; Raynauds; Scleroderma Oncologic Medical History: Negative for: Received Chemotherapy; Received Radiation Past Medical History Notes: melanoma skin Ca removal x2 weeks ago back HBO Extended History Items Eyes: Cataracts Immunizations Pneumococcal Vaccine: Received Pneumococcal Vaccination: Yes Received Pneumococcal Vaccination On or After 60th Birthday: Yes Implantable Devices Yes Hospitalization / Surgery History Type of Hospitalization/Surgery 11/2020  Aortic valve replacement triple BYpass 2010 FAll L1  vertebra surgery 8 months ago Fall 86months ago T7-T8 vastectomy 1972 partial prostate removal 2008 Family and Social History Cancer: No; Diabetes: No; Heart Disease: No; Hereditary Spherocytosis: No; Hypertension: No; Kidney Disease: No; Lung Disease: No; Seizures: No; Stroke: No; Thyroid Problems: No; Tuberculosis: No; Former smoker - quit 1964; Marital Status - Married; Alcohol Use: Daily; Drug Use: No History; Caffeine Use: Daily - coffee; Financial Concerns: No; Food, Clothing or Shelter Needs: No; Support System Lacking: No; Transportation Concerns: No Electronic Signature(s) Signed: 09/21/2020 5:58:14 PM By: Deon Pilling RN, BSN Signed: 09/22/2020 2:21:00 PM By: [name] [name] [name]-C Entered By: Deon Pilling on 09/21/2020 13:05:56 -------------------------------------------------------------------------------- SuperBill Details Patient Name: Date of Service: [name] [name] ID I. 09/21/2020 Medical Record Number: [id number] Patient Account Number: [id number] Date of Birth/Sex: Treating RN: Oct 24, 1926 (87 y.o. Male) Deon Pilling Primary Care Provider: [name] Other Clinician: Referring Provider: Treating Provider/Extender: Fran Lowes Weeks in Treatment: 0 Diagnosis Coding ICD-10 Codes Code Description L89.610 Pressure ulcer of right heel, unstageable I73.89 Other specified peripheral vascular diseases N40.1 Benign prostatic hyperplasia with lower urinary tract symptoms N18.30 Chronic kidney disease, stage 3 unspecified I10 Essential (primary) hypertension Facility Procedures CPT4 Code: 88325498 9 Description: 9213 - WOUND CARE VISIT-LEV 3 EST PT Modifier: Quantity: 1 CPT4 Code: 26415830 1 Description: 9407 - DEB SUBQ TISSUE 20 SQ CM/< ICD-10 Diagnosis Description L89.610 Pressure ulcer of right heel, unstageable Modifier: Quantity: 1 Physician Procedures : CPT4 Code Description Modifier [id number]  WC PHYS LEVEL 3 NEW PT 25 ICD-10 Diagnosis Description L89.610 Pressure ulcer of right heel, unstageable I73.89 Other specified peripheral vascular diseases N40.1 Benign prostatic hyperplasia with lower urinary  tract symptoms N18.30 Chronic kidney disease, stage 3 unspecified Quantity: 1 : [id number] 85929 - WC PHYS SUBQ TISS 20 SQ CM ICD-10 Diagnosis Description L89.610 Pressure ulcer of right heel, unstageable Quantity: 1 Electronic Signature(s) Signed: 09/22/2020 2:20:50 PM By: [name] [name] [name]-C Previous Signature: 09/21/2020 5:58:14 PM Version By: Deon Pilling RN, BSN Entered By: [name] on 09/22/2020 14:20:50

## 2020-09-23 DIAGNOSIS — L89610 Pressure ulcer of right heel, unstageable: Secondary | ICD-10-CM | POA: Diagnosis not present

## 2020-09-27 DIAGNOSIS — M5416 Radiculopathy, lumbar region: Secondary | ICD-10-CM | POA: Diagnosis not present

## 2020-09-28 ENCOUNTER — Other Ambulatory Visit: Payer: Self-pay

## 2020-09-28 ENCOUNTER — Encounter (HOSPITAL_BASED_OUTPATIENT_CLINIC_OR_DEPARTMENT_OTHER): Payer: Medicare Other | Admitting: Physician Assistant

## 2020-09-28 DIAGNOSIS — L89610 Pressure ulcer of right heel, unstageable: Secondary | ICD-10-CM | POA: Diagnosis not present

## 2020-09-28 DIAGNOSIS — L89613 Pressure ulcer of right heel, stage 3: Secondary | ICD-10-CM | POA: Diagnosis not present

## 2020-09-28 NOTE — Progress Notes (Addendum)
SIDDH, VANDEVENTER IMarland Kitchen ([id number]) Visit Report for 09/28/2020 Chief Complaint Document Details Patient Name: Date of Service: [name] [name] ID I. 09/28/2020 1:30 PM Medical Record Number: [id number] Patient Account Number: [id number] Date of Birth/Sex: Treating RN: 07/15/1926 (87 y.o. [name] Primary Care Provider: [name] Other Clinician: Referring Provider: Treating Provider/Extender: Eli Hose in Treatment: 1 Information Obtained from: Patient Chief Complaint Pressure ulcer right heel Electronic Signature(s) Signed: 09/28/2020 1:37:40 PM By: [name] PA-C Entered By: [name] on 09/28/2020 13:37:39 -------------------------------------------------------------------------------- Debridement Details Patient Name: Date of Service: [name] [name], [name] 09/28/2020 1:30 PM Medical Record Number: [id number] Patient Account Number: [id number] Date of Birth/Sex: Treating RN: 1926-08-31 (87 y.o. [name] [name] Primary Care Provider: [name] Other Clinician: Referring Provider: Treating Provider/Extender: Eli Hose in Treatment: 1 Debridement Performed for Assessment: Wound #1 Right,Lateral Calcaneus Performed By: Clinician Deon Pilling, RN Debridement Type: Chemical/Enzymatic/Mechanical Agent Used: Santyl Level of Consciousness (Pre-procedure): Awake and Alert Pre-procedure Verification/Time Out No Taken: Bleeding: None Response to Treatment: Procedure was tolerated well Level of Consciousness (Post- Awake and Alert procedure): Post Debridement Measurements of Total Wound Length: (cm) 1 Stage: Category/Stage III Width: (cm) 1.5 Depth: (cm) 0.1 Volume: (cm) 0.118 Character of Wound/Ulcer Post Debridement: Requires Further Debridement Post Procedure Diagnosis Same as Pre-procedure Electronic Signature(s) Signed: 09/28/2020 5:54:54 PM By: Deon Pilling RN, BSN Signed: 09/30/2020 4:42:31 PM By: [name] PA-C Entered By: Deon Pilling on 09/28/2020 14:36:42 -------------------------------------------------------------------------------- HPI Details Patient Name: Date of Service: [name] [name], [name] 09/28/2020 1:30 PM Medical Record Number: [id number] Patient Account Number: [id number] Date of Birth/Sex: Treating RN: December 14, 1926 (87 y.o. [name] Primary Care Provider: [name] Other Clinician: Referring Provider: Treating Provider/Extender: Eli Hose in Treatment: 1 History of Present Illness HPI Description: 09/21/2020 upon evaluation today patient appears to be doing somewhat poorly in regard to a wound in the right lateral heel location. This fortunately does not appear to be too bad but nonetheless is definitely a spot that we need to work on try to get healed for him. Has been here for quite some time. Fortunately there does not appear to be any evidence of active infection at this time which is great news. With that being said I do believe that he has some evidence here of peripheral vascular disease this is minimal his ABI 0.87. Other than that he does have a history of BPH, chronic kidney disease stage III, and hypertension. Currently he has been using different medication such as Silvadene or antibiotic ointment to try to help treat things. This just is not helping out as efficiently as it should be. Of note patient's history includes that of a triple bypass in 2010, a kyphoplasty at L1 which was done 8 months ago, unfortunately he had a fracture 2 months ago which she sustained a T7/T8 fracture to his spine which ended up with him being in the hospital. It is in that time since then that he developed this wound roughly 2 to 3 months ago is not exactly sure when. He does typically wear compression socks in the 20 to 30 mmHg range. 09/28/2020 upon evaluation today patient presents for follow-up and overall seems to be doing more poorly  based on what I am seeing currently. His ABI last time was measured at 0.87 but I am beginning to think this may  be falsely elevated due to the fact that there is more eschar and subsequently a concern here of vascular compromise that may be part of her issue. I think that we need to avoid any additional sharp debridement and make a referral to vascular surgery as soon as possible here. Subsequently also went ahead and did discuss the possibility of starting the patient on Santyl instead of the collagen in order to help clean up the surface of the wound this will take the place of sharp debridement currently. Electronic Signature(s) Signed: 09/30/2020 4:25:58 PM By: [name] PA-C Entered By: [name] on 09/30/2020 16:25:57 -------------------------------------------------------------------------------- Physical Exam Details Patient Name: Date of Service: [name] [name], [name] 09/28/2020 1:30 PM Medical Record Number: [id number] Patient Account Number: [id number] Date of Birth/Sex: Treating RN: July 19, 1926 (87 y.o. [name] Primary Care Provider: [name] Other Clinician: Referring Provider: Treating Provider/Extender: [name] in Treatment: 1 Constitutional Well-nourished and well-hydrated in no acute distress. Respiratory normal breathing without difficulty. Psychiatric this patient is able to make decisions and demonstrates good insight into disease process. Alert and Oriented x 3. pleasant and cooperative. Notes Upon inspection patient's wound bed again showed necrotic tissue in the base of the wound which unfortunately is not good as last time after I cleaned this off it appeared to be doing extremely well. This does have me concerned in that regard I think that he may be showing signs of arterial insufficiency issues here and that in turn means he is can have a difficult healing time if we do not get that under control. At bare  minimum I think a vascular referral for arterial studies is warranted. Electronic Signature(s) Signed: 09/30/2020 4:26:31 PM By: [name] PA-C Entered By: [name] on 09/30/2020 16:26:31 -------------------------------------------------------------------------------- Physician Orders Details Patient Name: Date of Service: [name] [name], [name] 09/28/2020 1:30 PM Medical Record Number: [id number] Patient Account Number: [id number] Date of Birth/Sex: Treating RN: Nov 21, 1926 (87 y.o. [name] [name] Primary Care Provider: [name] Other Clinician: Referring Provider: Treating Provider/Extender: Eli Hose in Treatment: 1 Verbal / Phone Orders: No Diagnosis Coding ICD-10 Coding Code Description L89.610 Pressure ulcer of right heel, unstageable I73.89 Other specified peripheral vascular diseases N40.1 Benign prostatic hyperplasia with lower urinary tract symptoms N18.30 Chronic kidney disease, stage 3 unspecified I10 Essential (primary) hypertension Follow-up Appointments Return appointment in 3 weeks. Jeri Cos, PA Go to pick up Santyl ointment from pharmacy. Someone will call you from [facility] to schedule arterial test. Bathing/ Shower/ Hygiene May shower and wash wound with soap and water. - wash wound last in shower with dial antibacterial soap, pat dry. Edema Control - Lymphedema / SCD / Other Elevate legs to the level of the heart or above for 30 minutes daily and/or when sitting, a frequency of: - 3-4 times a day through out the day. Avoid standing for long periods of time. Moisturize legs daily. - both legs every night before bed. Off-Loading Other: - Ensure to float right heel while resting in bed or chair with bunny boots. No not wear sandals to prevent friction. Ensure to wear tennis shoes. Home Health New wound care orders this week; continue Home Health for wound care. May utilize formulary equivalent dressing for  wound treatment orders unless otherwise specified. - [place] to change weekly all other days family to change. Wound Treatment Wound #1 -  Calcaneus Wound Laterality: Right, Lateral Cleanser: Normal Saline 1 x Per Day/30 Days Discharge Instructions: Cleanse the wound with Normal Saline prior to applying a clean dressing using gauze sponges, not tissue or cotton balls. Cleanser: Soap and Water 1 x Per Day/30 Days Discharge Instructions: May shower and wash wound with dial antibacterial soap and water prior to dressing change. Peri-Wound Care: Skin Prep (Home Health) (Generic) 1 x Per Day/30 Days Discharge Instructions: Use skin prep as directed Peri-Wound Care: Zinc Oxide Ointment 30g tube 1 x Per Day/30 Days Discharge Instructions: **apply only around the wound as needed for moisture noted to good skin.**Apply Zinc Oxide to periwound with each dressing change Services and Therapies rterial Studies- Bilateral - [facility] arterial studies with ABIs and TBIs to both legs and feet. - (ICD10 I73.89 - Other specified peripheral A vascular diseases) Patient Medications llergies: bee venom protein (honey bee), amoxicillin, morphine A Notifications Medication Indication Start End 09/28/2020 Santyl DOSE topical 250 unit/gram ointment - ointment topical Apply nickel thick daily to the wound bed and then cover with a dressing as directed in clinic x 30 days Electronic Signature(s) Signed: 09/28/2020 4:45:12 PM By: [name] PA-C Entered By: [name] on 09/28/2020 16:45:11 Prescription 09/28/2020 -------------------------------------------------------------------------------- Carlis Abbott, Rollin I. [name] [place] Patient Name: Provider: November 26, 1926 [id number] Date of Birth: NPI#: Jerilynn Mages [id number] Sex: DEA #: [phone] Phone #: License #: [place] Patient Address: [address], [address] [address] [place], [place] [address] [phone] Allergies bee venom protein (honey bee); amoxicillin; morphine Provider's Orders rterial Studies- Bilateral - ICD10: I73.89 - [facility] arterial studies with ABIs and TBIs to both legs and feet. A Hand Signature: Date(s): Electronic Signature(s) Signed: 09/30/2020 4:42:31 PM By: [name] PA-C Entered By: [name] on 09/28/2020 16:45:12 -------------------------------------------------------------------------------- Problem List Details Patient Name: Date of Service: [name] [name], [name] 09/28/2020 1:30 PM Medical Record Number: [id number] Patient Account Number: [id number] Date of Birth/Sex: Treating RN: [birth year] (87 y.o. [name] Primary Care Provider: [name] Other Clinician: Referring Provider: Treating Provider/Extender: Eli Hose in Treatment: 1 Active Problems ICD-10 Encounter Code Description Active Date MDM Diagnosis L89.610 Pressure ulcer of right heel, unstageable 09/21/2020 No Yes I73.89 Other specified peripheral vascular diseases 09/21/2020 No Yes N40.1 Benign prostatic hyperplasia with lower urinary tract symptoms 09/21/2020 No Yes N18.30 Chronic kidney disease, stage 3 unspecified 09/21/2020 No Yes I10 Essential (primary) hypertension 09/21/2020 No Yes Inactive Problems Resolved Problems Electronic Signature(s) Signed: 09/28/2020 1:37:35 PM By: [name] PA-C Entered By: [name] on 09/28/2020 13:37:34 -------------------------------------------------------------------------------- Progress Note Details Patient Name: Date of Service: [name] [name], [name] 09/28/2020 1:30 PM Medical Record Number: [id number] Patient Account Number: [id number] Date of Birth/Sex: Treating RN: [birth year] (87 y.o. [name] Primary Care Provider: [name] Other Clinician: Referring Provider: Treating Provider/Extender: Eli Hose in Treatment: 1 Subjective Chief Complaint Information obtained from Patient Pressure ulcer right heel History of Present Illness (HPI) 09/21/2020 upon evaluation today patient appears to be doing somewhat poorly in regard to a wound in the right lateral heel location. This fortunately does not appear to be too bad but nonetheless is definitely a spot that we need to work on try to get healed for him. Has been here for quite some time. Fortunately there does not appear  to be any evidence of active infection at this time which is great news. With that being said I do believe that he has some evidence here of peripheral vascular disease this is minimal his ABI 0.87. Other than that he does have a history of BPH, chronic kidney disease stage III, and hypertension. Currently he has been using different medication such as Silvadene or antibiotic ointment to try to help treat things. This just is not helping out as efficiently as it should be. Of note patient's history includes that of a triple bypass in 2010, a kyphoplasty at L1 which was done 8 months ago, unfortunately he had a fracture 2 months ago which she sustained a T7/T8 fracture to his spine which ended up with him being in the hospital. It is in that time since then that he developed this wound roughly 2 to 3 months ago is not exactly sure when. He does typically wear compression socks in the 20 to 30 mmHg range. 09/28/2020 upon evaluation today patient presents for follow-up and overall seems to be doing more poorly based on what I am seeing currently. His ABI last time was measured at 0.87 but I am beginning to think this may be falsely elevated due to the fact that there is more eschar and subsequently a concern here of vascular compromise that may be part of her issue. I think that we need to avoid any additional sharp debridement and make a referral to vascular surgery as soon as possible here. Subsequently also went ahead and did  discuss the possibility of starting the patient on Santyl instead of the collagen in order to help clean up the surface of the wound this will take the place of sharp debridement currently. Objective Constitutional Well-nourished and well-hydrated in no acute distress. Vitals Time Taken: 1:52 PM, Height: 72 in, Weight: 223 lbs, BMI: 30.2, Temperature: 97.6 F, Pulse: 64 bpm, Respiratory Rate: 20 breaths/min, Blood Pressure: 128/77 mmHg. Respiratory normal breathing without difficulty. Psychiatric this patient is able to make decisions and demonstrates good insight into disease process. Alert and Oriented x 3. pleasant and cooperative. General Notes: Upon inspection patient's wound bed again showed necrotic tissue in the base of the wound which unfortunately is not good as last time after I cleaned this off it appeared to be doing extremely well. This does have me concerned in that regard I think that he may be showing signs of arterial insufficiency issues here and that in turn means he is can have a difficult healing time if we do not get that under control. At bare minimum I think a vascular referral for arterial studies is warranted. Integumentary (Hair, Skin) Wound #1 status is Open. Original cause of wound was Pressure Injury. The date acquired was: 06/08/2020. The wound has been in treatment 1 weeks. The wound is located on the Right,Lateral Calcaneus. The wound measures 1cm length x 1.5cm width x 0.1cm depth; 1.178cm^2 area and 0.118cm^3 volume. There is Fat Layer (Subcutaneous Tissue) exposed. There is no tunneling or undermining noted. There is a medium amount of serosanguineous drainage noted. The wound margin is distinct with the outline attached to the wound base. There is small (1-33%) red granulation within the wound bed. There is a large (67-100%) amount of necrotic tissue within the wound bed including Eschar and Adherent Slough. General Notes: maceration and redness noted to  periwound. Assessment Active Problems ICD-10 Pressure ulcer of right heel, unstageable Other specified peripheral vascular diseases Benign prostatic hyperplasia with lower urinary  tract symptoms Chronic kidney disease, stage 3 unspecified Essential (primary) hypertension Procedures Wound #1 Pre-procedure diagnosis of Wound #1 is a Pressure Ulcer located on the Right,Lateral Calcaneus . There was a Chemical/Enzymatic/Mechanical debridement performed by Deon Pilling, RN.Marland Kitchen Agent used was [employer]. There was no bleeding. The procedure was tolerated well. Post Debridement Measurements: 1cm length x 1.5cm width x 0.1cm depth; 0.118cm^3 volume. Post debridement Stage noted as Category/Stage III. Character of Wound/Ulcer Post Debridement requires further debridement. Post procedure Diagnosis Wound #1: Same as Pre-Procedure Plan Follow-up Appointments: Return appointment in 3 weeks. Jeri Cos, PA Go to pick up Santyl ointment from pharmacy. Someone will call you from [facility] to schedule arterial test. Bathing/ Shower/ Hygiene: May shower and wash wound with soap and water. - wash wound last in shower with dial antibacterial soap, pat dry. Edema Control - Lymphedema / SCD / Other: Elevate legs to the level of the heart or above for 30 minutes daily and/or when sitting, a frequency of: - 3-4 times a day through out the day. Avoid standing for long periods of time. Moisturize legs daily. - both legs every night before bed. Off-Loading: Other: - Ensure to float right heel while resting in bed or chair with bunny boots. No not wear sandals to prevent friction. Ensure to wear tennis shoes. Home Health: New wound care orders this week; continue Home Health for wound care. May utilize formulary equivalent dressing for wound treatment orders unless otherwise specified. - [place] to change weekly all other days family to change. Services and Therapies ordered were: Arterial  Studies- Bilateral - [facility] arterial studies with ABIs and TBIs to both legs and feet. The following medication(s) was prescribed: Santyl topical 250 unit/gram ointment ointment topical Apply nickel thick daily to the wound bed and then cover with a dressing as directed in clinic x 30 days starting 09/28/2020 WOUND #1: - Calcaneus Wound Laterality: Right, Lateral Cleanser: Normal Saline 1 x Per Day/30 Days Discharge Instructions: Cleanse the wound with Normal Saline prior to applying a clean dressing using gauze sponges, not tissue or cotton balls. Cleanser: Soap and Water 1 x Per Day/30 Days Discharge Instructions: May shower and wash wound with dial antibacterial soap and water prior to dressing change. Peri-Wound Care: Skin Prep (Home Health) (Generic) 1 x Per Day/30 Days Discharge Instructions: Use skin prep as directed Peri-Wound Care: Zinc Oxide Ointment 30g tube 1 x Per Day/30 Days Discharge Instructions: **apply only around the wound as needed for moisture noted to good skin.**Apply Zinc Oxide to periwound with each dressing change 1. Would recommend currently that we going continue with wound care measures as before and the patient is in agreement the plan this includes the use of the dressing changes daily they are working to switch over to [employer]. I am can actually go ahead and send this into the pharmacy for the patient as well. 2. I am also can recommend that we have the patient continue to monitor for any signs of worsening or infection such as increased pain right now I do not see infection as a main issue but I am more concerned about his vascular status. 3. We will make referral to the vascular surgery office for vascular testing specifically arterial studies with ABI and TBI both feet. We will see patient back for reevaluation in 1 week here in the clinic. If anything worsens or changes patient will contact our office for additional recommendations. Electronic  Signature(s) Signed: 09/30/2020 4:27:26 PM  By: [name] PA-C Entered By: [name] on 09/30/2020 16:27:25 -------------------------------------------------------------------------------- SuperBill Details Patient Name: Date of Service: [name] [name], [name] 09/28/2020 Medical Record Number: [id number] Patient Account Number: [id number] Date of Birth/Sex: Treating RN: 08/11/1926 (87 y.o. [name] [name] Primary Care Provider: [name] Other Clinician: Referring Provider: Treating Provider/Extender: Eli Hose in Treatment: 1 Diagnosis Coding ICD-10 Codes Code Description L89.610 Pressure ulcer of right heel, unstageable I73.89 Other specified peripheral vascular diseases N40.1 Benign prostatic hyperplasia with lower urinary tract symptoms N18.30 Chronic kidney disease, stage 3 unspecified I10 Essential (primary) hypertension Facility Procedures CPT4 Code: 41030131 9 Description: 7602 - DEBRIDE W/O ANES NON SELECT Modifier: Quantity: 1 Physician Procedures : CPT4 Code Description Modifier [id number] 99214 - WC PHYS LEVEL 4 - EST PT ICD-10 Diagnosis Description L89.610 Pressure ulcer of right heel, unstageable I73.89 Other specified peripheral vascular diseases N40.1 Benign prostatic hyperplasia with lower  urinary tract symptoms N18.30 Chronic kidney disease, stage 3 unspecified Quantity: 1 Electronic Signature(s) Signed: 09/30/2020 4:27:55 PM By: [name] PA-C Previous Signature: 09/28/2020 5:54:54 PM Version By: Deon Pilling RN, BSN Entered By: [name] on 09/30/2020 16:27:54

## 2020-09-28 NOTE — Progress Notes (Signed)
CREEK, GAN I. ([id number]) Visit Report for 09/28/2020 Arrival Information Details Patient Name: Date of Service: [name] [name] ID I. 09/28/2020 1:30 PM Medical Record Number: [id number] Patient Account Number: [id number] Date of Birth/Sex: Treating RN: 08/25/1926 (87 y.o. [name] Primary Care Micholas Drumwright: [name] Other Clinician: Referring Jesilyn Easom: Treating Jaelle Campanile/Extender: Eli Hose in Treatment: 1 Visit Information History Since Last Visit Added or deleted any medications: No Patient Arrived: Wheel Chair Any new allergies or adverse reactions: No Arrival Time: 13:50 Had a fall or experienced change in No Accompanied By: wife activities of daily living that may affect Transfer Assistance: None risk of falls: Patient Identification Verified: Yes Signs or symptoms of abuse/neglect since last visito No Secondary Verification Process Completed: Yes Hospitalized since last visit: No Patient Requires Transmission-Based Precautions: No Implantable device outside of the clinic excluding No Patient Has Alerts: Yes cellular tissue based products placed in the center Patient Alerts: Patient on Blood Thinner since last visit: Has Dressing in Place as Prescribed: Yes Has Compression in Place as Prescribed: Yes Pain Present Now: No Electronic Signature(s) Signed: 09/28/2020 5:54:54 PM By: Deon Pilling RN, BSN Entered By: Deon Pilling on 09/28/2020 13:56:27 -------------------------------------------------------------------------------- Encounter Discharge Information Details Patient Name: Date of Service: [name] [name], [name] 09/28/2020 1:30 PM Medical Record Number: [id number] Patient Account Number: [id number] Date of Birth/Sex: Treating RN: [birth year] (87 y.o. [name] Primary Care Joshau Code: [name] Other Clinician: Referring Cataleyah Colborn: Treating Anouk Critzer/Extender: Eli Hose in Treatment: 1 Encounter  Discharge Information Items Post Procedure Vitals Discharge Condition: Stable Temperature (F): 97.6 Ambulatory Status: Ambulatory Pulse (bpm): 64 Discharge Destination: Home Respiratory Rate (breaths/min): 20 Transportation: Private Auto Blood Pressure (mmHg): 128/77 Accompanied By: wife Schedule Follow-up Appointment: Yes Clinical Summary of Care: Electronic Signature(s) Signed: 09/28/2020 5:54:54 PM By: Deon Pilling RN, BSN Entered By: Deon Pilling on 09/28/2020 14:56:39 -------------------------------------------------------------------------------- Lower Extremity Assessment Details Patient Name: Date of Service: [name] [name], [name] 09/28/2020 1:30 PM Medical Record Number: [id number] Patient Account Number: [id number] Date of Birth/Sex: Treating RN: [birth year] (87 y.o. [name] Primary Care Shanielle Correll: [name] Other Clinician: Referring Joselynne Killam: Treating Sandhya Denherder/Extender: [name] in Treatment: 1 Edema Assessment Assessed: [Left: No] [Right: Yes] Edema: [Left: Ye] [Right: s] Calf Left: Right: Point of Measurement: 34 cm From Medial Instep 40 cm Ankle Left: Right: Point of Measurement: 11 cm From Medial Instep 30 cm Vascular Assessment Pulses: Dorsalis Pedis Palpable: [Right:Yes] Electronic Signature(s) Signed: 09/28/2020 5:54:54 PM By: Deon Pilling RN, BSN Entered By: Deon Pilling on 09/28/2020 14:02:32 -------------------------------------------------------------------------------- Multi-Disciplinary Care Plan Details Patient Name: Date of Service: [name] [name], [name] 09/28/2020 1:30 PM Medical Record Number: [id number] Patient Account Number: [id number] Date of Birth/Sex: Treating RN: 1926-08-30 (87 y.o. [name] Primary Care Ayomikun Starling: [name] Other Clinician: Referring Ambriel Gorelick: Treating Adean Milosevic/Extender: Eli Hose in Treatment: 1 Active Inactive Pain, Acute or  Chronic Nursing Diagnoses: Pain, acute or chronic: actual or potential Potential alteration in comfort, pain Goals: Patient will verbalize adequate pain control and receive pain control interventions during procedures as needed Date Initiated: 09/21/2020 Target Resolution Date: 10/07/2020 Goal Status: Active Patient/caregiver will verbalize comfort level met Date Initiated: 09/21/2020 Target Resolution Date: 10/06/2020 Goal Status: Active Interventions: Encourage patient to take pain medications as prescribed Provide education on pain management Treatment Activities: Administer pain control measures as ordered :  09/21/2020 Notes: Tissue Oxygenation Nursing Diagnoses: Potential alteration in peripheral tissue perfusion (select prior to confirmation of diagnosis) Goals: Non-invasive arterial studies are completed as ordered Date Initiated: 09/21/2020 Target Resolution Date: 09/21/2020 Goal Status: Active Revascularization procedures completed as ordered Date Initiated: 09/21/2020 Target Resolution Date: 09/30/2020 Goal Status: Active Interventions: Assess patient understanding of disease process and management upon diagnosis and as needed Provide education on tissue oxygenation and ischemia Treatment Activities: Ankle Brachial Index (ABI) : 09/21/2020 Non-invasive vascular studies : 09/21/2020 T ordered outside of clinic : 09/21/2020 est Notes: Wound/Skin Impairment Nursing Diagnoses: Knowledge deficit related to ulceration/compromised skin integrity Goals: Patient/caregiver will verbalize understanding of skin care regimen Date Initiated: 09/21/2020 Target Resolution Date: 10/07/2020 Goal Status: Active Interventions: Assess patient/caregiver ability to obtain necessary supplies Assess patient/caregiver ability to perform ulcer/skin care regimen upon admission and as needed Provide education on ulcer and skin care Treatment Activities: Skin care regimen initiated :  09/21/2020 Topical wound management initiated : 09/21/2020 Notes: Electronic Signature(s) Signed: 09/28/2020 5:54:54 PM By: Deon Pilling RN, BSN Entered By: Deon Pilling on 09/28/2020 14:03:46 -------------------------------------------------------------------------------- Pain Assessment Details Patient Name: Date of Service: [name] [name], [name] 09/28/2020 1:30 PM Medical Record Number: [id number] Patient Account Number: [id number] Date of Birth/Sex: Treating RN: 18-Oct-1926 (87 y.o. [name] Primary Care Madelaine Whipple: [name] Other Clinician: Referring Elijio Staples: Treating Amoreena Neubert/Extender: Eli Hose in Treatment: 1 Active Problems Location of Pain Severity and Description of Pain Patient Has Paino No Site Locations Rate the pain. Current Pain Level: 0 Pain Management and Medication Current Pain Management: Medication: No Cold Application: No Rest: No Massage: No Activity: No T.E.N.S.: No Heat Application: No Leg drop or elevation: No Is the Current Pain Management Adequate: Inadequate How does your wound impact your activities of daily livingo Sleep: No Bathing: No Appetite: No Relationship With Others: No Bladder Continence: No Emotions: No Bowel Continence: No Work: No Toileting: No Drive: No Dressing: No Hobbies: No Notes per patient pain at night at times. Electronic Signature(s) Signed: 09/28/2020 5:54:54 PM By: Deon Pilling RN, BSN Entered By: Deon Pilling on 09/28/2020 13:57:08 -------------------------------------------------------------------------------- Patient/Caregiver Education Details Patient Name: Date of Service: [name] [name], Shaune Pascal ID I. 9/28/2022andnbsp1:30 PM Medical Record Number: [id number] Patient Account Number: [id number] Date of Birth/Gender: Treating RN: 1926-01-20 (87 y.o. [name] Primary Care Physician: [name] Other Clinician: Referring Physician: Treating Physician/Extender: Eli Hose in Treatment: 1 Education Assessment Education Provided To: Patient Education Topics Provided Wound/Skin Impairment: Handouts: Skin Care Do's and Dont's Methods: Explain/Verbal Responses: Reinforcements needed Electronic Signature(s) Signed: 09/28/2020 5:54:54 PM By: Deon Pilling RN, BSN Entered By: Deon Pilling on 09/28/2020 14:03:58 -------------------------------------------------------------------------------- Wound Assessment Details Patient Name: Date of Service: [name] [name], [name] 09/28/2020 1:30 PM Medical Record Number: [id number] Patient Account Number: [id number] Date of Birth/Sex: Treating RN: 1926/11/20 (87 y.o. [name], [name] Primary Care Joaovictor Krone: [name] Other Clinician: Referring Niyana Chesbro: Treating Eaven Schwager/Extender: [name] in Treatment: 1 Wound Status Wound Number: 1 Primary Pressure Ulcer Etiology: Wound Location: Right, Lateral Calcaneus Wound Open Wounding Event: Pressure Injury Status: Date Acquired: 06/08/2020 Comorbid Cataracts, Sleep Apnea, Arrhythmia, Congestive Heart Failure, Weeks Of Treatment: 1 History: Coronary Artery Disease, Hypertension, Myocardial Infarction, Clustered Wound: No End Stage Renal Disease, Gout, Dementia, Neuropathy Photos Wound Measurements Length: (cm) 1 Width: (cm) 1.5 Depth: (cm) 0.1 Area: (cm) 1.178 Volume: (cm) 0.118 % Reduction in Area: -  138% % Reduction in Volume: -140.8% Epithelialization: Small (1-33%) Tunneling: No Undermining: No Wound Description Classification: Category/Stage III Wound Margin: Distinct, outline attached Exudate Amount: Medium Exudate Type: Serosanguineous Exudate Color: red, brown Foul Odor After Cleansing: No Slough/Fibrino Yes Wound Bed Granulation Amount: Small (1-33%) Exposed Structure Granulation Quality: Red Fascia Exposed: No Necrotic Amount: Large (67-100%) Fat Layer (Subcutaneous Tissue) Exposed:  Yes Necrotic Quality: Eschar, Adherent Slough Tendon Exposed: No Muscle Exposed: No Joint Exposed: No Bone Exposed: No Assessment Notes maceration and redness noted to periwound. Treatment Notes Wound #1 (Calcaneus) Wound Laterality: Right, Lateral Cleanser Normal Saline Discharge Instruction: Cleanse the wound with Normal Saline prior to applying a clean dressing using gauze sponges, not tissue or cotton balls. Soap and Water Discharge Instruction: May shower and wash wound with dial antibacterial soap and water prior to dressing change. Peri-Wound Care Skin Prep Discharge Instruction: Use skin prep as directed Zinc Oxide Ointment 30g tube Discharge Instruction: **apply only around the wound as needed for moisture noted to good skin.**Apply Zinc Oxide to periwound with each dressing change Topical Primary Dressing Secondary Dressing Secured With Compression Wrap Compression Stockings Add-Ons Electronic Signature(s) Signed: 09/28/2020 5:54:54 PM By: Deon Pilling RN, BSN Entered By: Deon Pilling on 09/28/2020 14:36:32 -------------------------------------------------------------------------------- Vitals Details Patient Name: Date of Service: [name] [name], [name] 09/28/2020 1:30 PM Medical Record Number: [id number] Patient Account Number: [id number] Date of Birth/Sex: Treating RN: 1926-05-05 (87 y.o. [name] Primary Care Tarrance Januszewski: [name] Other Clinician: Referring Emilya Justen: Treating Kuuipo Anzaldo/Extender: Eli Hose in Treatment: 1 Vital Signs Time Taken: 13:52 Temperature (F): 97.6 Height (in): 72 Pulse (bpm): 64 Weight (lbs): 223 Respiratory Rate (breaths/min): 20 Body Mass Index (BMI): 30.2 Blood Pressure (mmHg): 128/77 Reference Range: 80 - 120 mg / dl Electronic Signature(s) Signed: 09/28/2020 5:54:54 PM By: Deon Pilling RN, BSN Entered By: Deon Pilling on 09/28/2020 13:56:44

## 2020-09-30 ENCOUNTER — Encounter (HOSPITAL_COMMUNITY): Payer: Medicare Other

## 2020-10-03 ENCOUNTER — Other Ambulatory Visit: Payer: Self-pay

## 2020-10-03 ENCOUNTER — Ambulatory Visit (INDEPENDENT_AMBULATORY_CARE_PROVIDER_SITE_OTHER): Payer: Medicare Other

## 2020-10-03 ENCOUNTER — Telehealth: Payer: Self-pay | Admitting: Student

## 2020-10-03 DIAGNOSIS — T63441D Toxic effect of venom of bees, accidental (unintentional), subsequent encounter: Secondary | ICD-10-CM | POA: Diagnosis not present

## 2020-10-03 MED ORDER — POTASSIUM CHLORIDE ER 10 MEQ PO CPCR: ORAL_CAPSULE | ORAL | 1 refills | Status: DC

## 2020-10-03 NOTE — Telephone Encounter (Signed)
Pt req refill for his potassium 10  [facility] on [address]

## 2020-10-03 NOTE — Telephone Encounter (Signed)
Done

## 2020-10-05 ENCOUNTER — Ambulatory Visit: Payer: Medicare Other | Admitting: Student

## 2020-10-05 NOTE — Progress Notes (Signed)
Primary Physician/Referring:  [name], MD  Patient ID: [name], male    DOB: September 11, 1926, 87 y.o.   MRN: [id number]  Chief Complaint  Patient presents with   Atrial Fibrillation   Follow-up   Chest Pain   HPI:    MCCLAIN SHALL  is a 87 y.o. Caucasian male  with vitamin B12 deficiency, mild dementia, hyperlipidemia, hypertension, CAD, chronic stable angina status post CABG x 3 and Maze and LAA amputation in 2014, paroxysmal atrial fibrillation, obstructive sleep apnea not currently on CPAP, moderate aortic stenosis with thoracic aortic aneurysm and dilation of ascending aorta and follows Dr. Lilly Cove (No correlation with TTE with regards to aneurysm), venous stasis edema. Patient underwent TAVR 11/10/2019, at which time he presented in atypical atrial flutter but negative chronotropic were avoided due to age, aortic valve disease and bradycardia. He spontaneously converted to sinus, but presented to our office in atrial fibrillation and acute decompensated heart failure on 11/13/2019.  At this time he was started on increased dose of diuretics as well as amiodarone.   He was admitted 08/18/2020 - 08/23/2020 for vertebral fracture following a fall.  During admission patient was treated with IV Lasix for acute on chronic diastolic heart failure and diuresed 12.5 L.  He then followed up in the office 09/07/2020 and was in atrial fibrillation with controlled ventricular response.  Therefore recommended close follow-up with repeat EKG.  Also last office visit switch patient from furosemide to torsemide 40 mg twice daily.  Repeat BMP and BNP were improved compared to previous.  He now presents for follow-up.  Patient is medically now following with wound management clinic due to nonhealing wound on his right heel.  He is currently scheduled to undergo ABI and TBI next week.  He does report occasional episodes of chest pain in the morning when he wakes up after lying on his left side.  This is not  associated with exertion and resolves in 20 to 30 minutes when he gets out of bed.  Patient also continues to follow with Dr. Haynes Kerns for management of anemia and is considering transfusion.  He is tolerating anticoagulation without known bleeding diathesis.  Denies blood in his urine or blood in his stool.  Past Medical History:  Diagnosis Date   AAA (abdominal aortic aneurysm)    Acute coronary syndrome (HCC) 1/32/4401   Acute systolic heart failure ([place]) 03/03/2012   Angina    Arthritis    Ascending aorta dilatation ([place]) 03/13/2012   Fusiform dilatation of the ascending thoracic aorta discovered during surgery, max diameter 4.2-4.3 cm   Atrial fibrillation (HCC) 02/29/2012   Recurrent paroxysmal, new-onset   BPH (benign prostatic hypertrophy)    CAD (coronary artery disease)    Cath April 2012 40% left main, 50% LAD, 40% OM, occluded RCA with L-R collaterals    CHF (congestive heart failure) (HCC)    Chronic venous insufficiency    Dyspnea    GERD (gastroesophageal reflux disease)    Glaucoma    Gout    Headache    MIGRAINES in the past   History of kidney stones    Hyperlipidemia    Hypertension    MI (myocardial infarction) ([place])    Obesity (BMI 30-39.9)    Paroxysmal atrial fibrillation (HCC) 02/29/2012   Recurrent paroxysmal, new-onset    PMR (polymyalgia rheumatica) (HCC)    Pre-diabetes    S/P CABG x 3 03/06/2012   LIMA to LAD, SVG to OM, SVG to  RCA, EVH via right thigh   S/P Maze operation for atrial fibrillation 03/06/2012   Complete bilateral lesions set using bipolar radiofrequency and cryothermy ablation with clipping of LA appendage   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   s/p TAVR with a 26 mm Edwards via the left subclavian by Drs Buena Irish and Bartle   Severe aortic stenosis 09/29/2019   Sleep apnea    USES CPAP NIGHTLY   Past Surgical History:  Procedure Laterality Date   CARDIAC CATHETERIZATION     CATARACT EXTRACTION, BILATERAL      with lens implants   CHOLECYSTECTOMY N/A 03/24/2012   Procedure: LAPAROSCOPIC CHOLECYSTECTOMY WITH INTRAOPERATIVE CHOLANGIOGRAM;  Surgeon: Zenovia Jarred, MD;  Location: [place];  Service: General;  Laterality: N/A;   CORONARY ARTERY BYPASS GRAFT N/A 03/06/2012   Procedure: CORONARY ARTERY BYPASS GRAFTING (CABG);  Surgeon: Rexene Alberts, MD;  Location: [place];  Service: Open Heart Surgery;  Laterality: N/A;   ENDOVEIN HARVEST OF GREATER SAPHENOUS VEIN Right 03/06/2012   Procedure: ENDOVEIN HARVEST OF GREATER SAPHENOUS VEIN;  Surgeon: Rexene Alberts, MD;  Location: [place];  Service: Open Heart Surgery;  Laterality: Right;   INTRAOPERATIVE TRANSESOPHAGEAL ECHOCARDIOGRAM N/A 03/06/2012   Procedure: INTRAOPERATIVE TRANSESOPHAGEAL ECHOCARDIOGRAM;  Surgeon: Rexene Alberts, MD;  Location: [place];  Service: Open Heart Surgery;  Laterality: N/A;   IR KYPHO LUMBAR INC FX REDUCE BONE BX UNI/BIL CANNULATION INC/IMAGING  02/23/2020   LEFT HEART CATH Right 03/02/2012   Procedure: LEFT HEART CATH;  Surgeon: Sherren Mocha, MD;  Location: [facility] CATH LAB;  Service: Cardiovascular;  Laterality: Right;   MAZE N/A 03/06/2012   Procedure: MAZE;  Surgeon: Rexene Alberts, MD;  Location: [place];  Service: Open Heart Surgery;  Laterality: N/A;   PROSTATECTOMY     partial   RIGHT/LEFT HEART CATH AND CORONARY/GRAFT ANGIOGRAPHY N/A 09/29/2019   Procedure: RIGHT/LEFT HEART CATH AND CORONARY/GRAFT ANGIOGRAPHY;  Surgeon: Adrian Prows, MD;  Location: [place] CV LAB;  Service: Cardiovascular;  Laterality: N/A;   TEE WITHOUT CARDIOVERSION N/A 11/10/2019   Procedure: TRANSESOPHAGEAL ECHOCARDIOGRAM (TEE);  Surgeon: Burnell Blanks, MD;  Location: [place];  Service: Open Heart Surgery;  Laterality: N/A;   TRANSCATHETER AORTIC VALVE REPLACEMENT, TRANSFEMORAL  11/10/2019   Family History  Problem Relation Age of Onset   Dementia Mother    Stroke Brother     Social History   Tobacco Use   Smoking status: Former    Packs/day: 0.50     Years: 15.00    Pack years: 7.50    Types: Cigarettes    Quit date: 11/18/1962    Years since quitting: 57.9   Smokeless tobacco: Never  Substance Use Topics   Alcohol use: Yes    Alcohol/week: 2.0 standard drinks    Types: 2 Shots of liquor per week    Comment: daily   ROS  Review of Systems  Constitutional: Negative for malaise/fatigue and weight gain.  Cardiovascular:  Positive for chest pain (at rest) and leg swelling (chronic, stable). Negative for claudication, near-syncope, orthopnea, palpitations, paroxysmal nocturnal dyspnea and syncope.  Respiratory:  Negative for shortness of breath.   Neurological:  Negative for dizziness.  Objective  Blood pressure (!) 118/58, pulse (!) 53, weight 214 lb (97.1 kg), SpO2 95 %.  Vitals with BMI 10/06/2020 09/07/2020 08/23/2020  Height - '6\' 1"'  -  Weight 214 lbs 213 lbs -  BMI 69.62 95.28 -  Systolic 413 244 010  Diastolic 58 63  69  Pulse 53 63 96     Physical Exam Vitals reviewed.  Constitutional:      General: He is not in acute distress.    Appearance: He is well-developed.     Comments: Presented to today's office visit in wheelchair  Cardiovascular:     Rate and Rhythm: Normal rate and regular rhythm.     Pulses:          Carotid pulses are 2+ on the right side and 2+ on the left side.      Radial pulses are 2+ on the right side and 2+ on the left side.       Femoral pulses are 2+ on the right side and 2+ on the left side.      Popliteal pulses are 2+ on the right side and 2+ on the left side.       Dorsalis pedis pulses are 0 on the right side and 1+ on the left side.       Posterior tibial pulses are 0 on the right side and 0 on the left side.     Heart sounds: Murmur heard.  Early systolic murmur is present with a grade of 2/6 at the upper right sternal border. S1 is normal, S2 is muffled.    No gallop.     Comments: No JVD Pulmonary:     Effort: Pulmonary effort is normal.     Breath sounds: No rhonchi or rales.   Musculoskeletal:     Right lower leg: Edema (1-2+ pitting) present.     Left lower leg: Edema (1-2+ pitting) present.  Skin:    General: Skin is warm and dry.     Comments: Unable to evaluate right heel wound as it is presently dressed per wound management.  Neurological:     Mental Status: He is alert.   Laboratory examination:   Recent Labs    10/09/19 1151 10/13/19 0838 11/19/19 1410 12/18/19 1124 02/23/20 1135 08/21/20 0311 08/22/20 0747 08/23/20 0240  NA 141   < > 139 139   < > 137 137 137  K 4.1   < > 4.3 4.1   < > 3.3* 3.0* 3.9  CL 103   < > 100 100   < > 102 100 102  CO2 23   < > 25 25   < > '27 26 26  ' GLUCOSE 115*   < > 105* 119*   < > 95 114* 102*  BUN 25   < > 29 21   < > 20 22 24*  CREATININE 1.33*   < > 1.52* 1.77*   < > 1.56* 1.45* 1.49*  CALCIUM 10.0   < > 9.7 9.9   < > 8.7* 9.0 8.9  GFRNONAA 46*   < > 39* 33*   < > 41* 45* 44*  GFRAA 54*  --  46* 38*  --   --   --   --    < > = values in this interval not displayed.   CrCl cannot be calculated (Patient's most recent lab result is older than the maximum 21 days allowed.).  CMP Latest Ref Rng & Units 08/23/2020 08/22/2020 08/21/2020  Glucose 70 - 99 mg/dL 102(H) 114(H) 95  BUN 8 - 23 mg/dL 24(H) 22 20  Creatinine 0.61 - 1.24 mg/dL 1.49(H) 1.45(H) 1.56(H)  Sodium 135 - 145 mmol/L 137 137 137  Potassium 3.5 - 5.1 mmol/L 3.9 3.0(L) 3.3(L)  Chloride 98 - 111 mmol/L  102 100 102  CO2 22 - 32 mmol/L '26 26 27  ' Calcium 8.9 - 10.3 mg/dL 8.9 9.0 8.7(L)  Total Protein 6.5 - 8.1 g/dL 5.4(L) - -  Total Bilirubin 0.3 - 1.2 mg/dL 0.7 - -  Alkaline Phos 38 - 126 U/L 75 - -  AST 15 - 41 U/L 28 - -  ALT 0 - 44 U/L 29 - -   CBC Latest Ref Rng & Units 08/23/2020 08/22/2020 08/21/2020  WBC 4.0 - 10.5 K/uL 6.1 7.3 6.0  Hemoglobin 13.0 - 17.0 g/dL 8.5(L) 9.9(L) 8.9(L)  Hematocrit 39.0 - 52.0 % 26.1(L) 30.1(L) 27.9(L)  Platelets 150 - 400 K/uL 114(L) 116(L) 99(L)   TSH No results for input(s): TSH in the last 8760  hours.  BNP    Component Value Date/Time   BNP 199.4 (H) 08/23/2020 0240    ProBNP    Component Value Date/Time   PROBNP 185 08/29/2018 1218   PROBNP 431.9 06/25/2012 1420     External labs:  09/15/2020: BNP 184.3 Platelet 96, hemoglobin 8.9, hematocrit 27.1 Glucose 115, BUN 22, creatinine 1.6, GFR 40.6, sodium 143, potassium 4.4  06/14/2020: Glucose 210, BUN 30, creatinine 2.1, GFR 29.7, sodium 134, potassium 4.9,  06/10/2020:  Glucose 5, BUN 40, creatinine 2.1, GFR 29.7, sodium 131, potassium 5.2, AST 20, ALT 27, alk phos 90  06/07/2020: Hemoglobin 9.6, hematocrit 29.9, MCV 95, platelet 168 Sed rate 56, CRP 197 ALT 23, AST 43, alk phos 105, 135, potassium 4.4, creatinine 1.77, BUN 30 Will, GFR 36 A1c 6.0%  Cholesterol, total 115.000 m 01/19/2019 HDL 47 MG/DL 01/19/2019 LDL 51.000 mg 01/19/2019 Triglycerides 84.000 01/19/2019  A1C 5.600 % 01/19/2019; TSH 2.320 01/19/2019  Hemoglobin 10.900 g/ 02/06/2019; INR 1.900 09/11/2018 Platelets 182.000 09/11/2018  Creatinine, Serum 1.300 mg/ 02/06/2019 Potassium 4.100 09/11/2018 Magnesium N/D ALT (SGPT) 15.000 uni 01/19/2019  Allergies   Allergies  Allergen Reactions   Amoxicillin Other (See Comments)    Headache, debilitated   Bee Venom Anaphylaxis   Avelox [Moxifloxacin]     Other reaction(s): Unknown   Duloxetine Hcl     Other reaction(s): felt weird   Levitra [Vardenafil]     Other reaction(s): Unknown   Morphine And Related Other (See Comments)    hypotension   Testosterone Other (See Comments)    unknown   Viagra [Sildenafil]     Other reaction(s): didn't like it    Medications Prior to Visit:   Outpatient Medications Prior to Visit  Medication Sig Dispense Refill   acetaminophen (TYLENOL) 500 MG tablet Take 2 tablets (1,000 mg total) by mouth 3 (three) times daily. For 3 to 5 days and then change to 1000 mg 3 times daily as needed for pain.     amiodarone (PACERONE) 200 MG tablet Take 1 tablet (200 mg total) by  mouth daily. 90 tablet 3   amiodarone (PACERONE) 200 MG tablet 1 tablet     amLODipine (NORVASC) 10 MG tablet Take 1 tablet (10 mg total) by mouth daily. 90 tablet 3   cholecalciferol (VITAMIN D3) 25 MCG (1000 UNIT) tablet Take 1,000 Units by mouth daily.      denosumab (PROLIA) 60 MG/ML SOSY injection Inject 60 mg into the skin every 6 (six) months.     docusate sodium (COLACE) 100 MG capsule Take 1 capsule (100 mg total) by mouth 2 (two) times daily.     EPINEPHrine 0.3 mg/0.3 mL IJ SOAJ injection Inject 0.3 mLs (0.3 mg total) into the muscle as needed for  anaphylaxis. (Patient taking differently: Inject 0.3 mg into the muscle once as needed for anaphylaxis.) 2 each 2   ezetimibe (ZETIA) 10 MG tablet Take 10 mg by mouth daily.     finasteride (PROSCAR) 5 MG tablet Take 5 mg by mouth daily.     fish oil-omega-3 fatty acids 1000 MG capsule Take 1 g by mouth daily.      fluticasone (FLONASE) 50 MCG/ACT nasal spray Place 1 spray into both nostrils daily as needed for allergies.     gabapentin (NEURONTIN) 100 MG capsule Take 100 mg by mouth 3 (three) times daily.     galantamine (RAZADYNE ER) 8 MG 24 hr capsule Take 8 mg by mouth Daily.     HONEY BEE VENOM IJ Inject 2 each as directed See admin instructions. Every eight weeks     hydrALAZINE (APRESOLINE) 25 MG tablet Take 25 mg by mouth 2 (two) times daily.     isosorbide mononitrate (IMDUR) 60 MG 24 hr tablet TAKE 1 TABLET(60 MG) BY MOUTH DAILY AFTER SUPPER (Patient taking differently: Take 60 mg by mouth daily.) 90 tablet 1   leflunomide (ARAVA) 10 MG tablet Take 10 mg by mouth daily.     magnesium gluconate (MAGONATE) 500 MG tablet Take 500 mg by mouth daily.     memantine (NAMENDA) 10 MG tablet Take 10 mg by mouth 2 (two) times daily.     metoprolol succinate (TOPROL-XL) 25 MG 24 hr tablet Take 1 tablet (25 mg total) by mouth daily as needed (for high heart rate, consistently greater than 110/min.).     nitroGLYCERIN (NITROSTAT) 0.4 MG SL tablet  Place 1 tablet (0.4 mg total) under the tongue every 5 (five) minutes x 3 doses as needed for chest pain. 30 tablet 3   oxyCODONE (OXY IR/ROXICODONE) 5 MG immediate release tablet Take 0.5 tablets (2.5 mg total) by mouth every 12 (twelve) hours as needed for breakthrough pain. 3 tablet 0   pantoprazole (PROTONIX) 40 MG tablet Take 40 mg by mouth every other day.   4   polyethylene glycol (MIRALAX / GLYCOLAX) 17 g packet Take 17 g by mouth 2 (two) times daily.     potassium chloride (MICRO-K) 10 MEQ CR capsule TAKE ONE CAPSULE BY MOUTH DAILY WITH LASIX 90 capsule 1   predniSONE (DELTASONE) 1 MG tablet Take 9 mg by mouth daily with breakfast.     PRESCRIPTION MEDICATION Cpap     Psyllium Husk POWD Take 1 Scoop by mouth daily.      Rivaroxaban (XARELTO) 15 MG TABS tablet Take 15 mg by mouth daily with supper.     tamsulosin (FLOMAX) 0.4 MG CAPS capsule Take 1 capsule (0.4 mg total) by mouth daily after supper.     torsemide (DEMADEX) 20 MG tablet Take 2 tablets (40 mg total) by mouth daily. 180 tablet 1   triamcinolone cream (KENALOG) 0.1 % Apply 1 application topically daily as needed (skin irritation).     vitamin B-12 (CYANOCOBALAMIN) 1000 MCG tablet Take 1,000 mcg by mouth daily.      leflunomide (ARAVA) 20 MG tablet Take 10 mg by mouth daily.     No facility-administered medications prior to visit.   Final Medications at End of Visit    Current Meds  Medication Sig   acetaminophen (TYLENOL) 500 MG tablet Take 2 tablets (1,000 mg total) by mouth 3 (three) times daily. For 3 to 5 days and then change to 1000 mg 3 times daily as needed for pain.  amiodarone (PACERONE) 200 MG tablet Take 1 tablet (200 mg total) by mouth daily.   amiodarone (PACERONE) 200 MG tablet 1 tablet   amLODipine (NORVASC) 10 MG tablet Take 1 tablet (10 mg total) by mouth daily.   cholecalciferol (VITAMIN D3) 25 MCG (1000 UNIT) tablet Take 1,000 Units by mouth daily.    denosumab (PROLIA) 60 MG/ML SOSY injection Inject  60 mg into the skin every 6 (six) months.   docusate sodium (COLACE) 100 MG capsule Take 1 capsule (100 mg total) by mouth 2 (two) times daily.   EPINEPHrine 0.3 mg/0.3 mL IJ SOAJ injection Inject 0.3 mLs (0.3 mg total) into the muscle as needed for anaphylaxis. (Patient taking differently: Inject 0.3 mg into the muscle once as needed for anaphylaxis.)   ezetimibe (ZETIA) 10 MG tablet Take 10 mg by mouth daily.   finasteride (PROSCAR) 5 MG tablet Take 5 mg by mouth daily.   fish oil-omega-3 fatty acids 1000 MG capsule Take 1 g by mouth daily.    fluticasone (FLONASE) 50 MCG/ACT nasal spray Place 1 spray into both nostrils daily as needed for allergies.   gabapentin (NEURONTIN) 100 MG capsule Take 100 mg by mouth 3 (three) times daily.   galantamine (RAZADYNE ER) 8 MG 24 hr capsule Take 8 mg by mouth Daily.   HONEY BEE VENOM IJ Inject 2 each as directed See admin instructions. Every eight weeks   hydrALAZINE (APRESOLINE) 25 MG tablet Take 25 mg by mouth 2 (two) times daily.   isosorbide mononitrate (IMDUR) 60 MG 24 hr tablet TAKE 1 TABLET(60 MG) BY MOUTH DAILY AFTER SUPPER (Patient taking differently: Take 60 mg by mouth daily.)   leflunomide (ARAVA) 10 MG tablet Take 10 mg by mouth daily.   magnesium gluconate (MAGONATE) 500 MG tablet Take 500 mg by mouth daily.   memantine (NAMENDA) 10 MG tablet Take 10 mg by mouth 2 (two) times daily.   metoprolol succinate (TOPROL-XL) 25 MG 24 hr tablet Take 1 tablet (25 mg total) by mouth daily as needed (for high heart rate, consistently greater than 110/min.).   nitroGLYCERIN (NITROSTAT) 0.4 MG SL tablet Place 1 tablet (0.4 mg total) under the tongue every 5 (five) minutes x 3 doses as needed for chest pain.   oxyCODONE (OXY IR/ROXICODONE) 5 MG immediate release tablet Take 0.5 tablets (2.5 mg total) by mouth every 12 (twelve) hours as needed for breakthrough pain.   pantoprazole (PROTONIX) 40 MG tablet Take 40 mg by mouth every other day.    polyethylene  glycol (MIRALAX / GLYCOLAX) 17 g packet Take 17 g by mouth 2 (two) times daily.   potassium chloride (MICRO-K) 10 MEQ CR capsule TAKE ONE CAPSULE BY MOUTH DAILY WITH LASIX   predniSONE (DELTASONE) 1 MG tablet Take 9 mg by mouth daily with breakfast.   PRESCRIPTION MEDICATION Cpap   Psyllium Husk POWD Take 1 Scoop by mouth daily.    Rivaroxaban (XARELTO) 15 MG TABS tablet Take 15 mg by mouth daily with supper.   tamsulosin (FLOMAX) 0.4 MG CAPS capsule Take 1 capsule (0.4 mg total) by mouth daily after supper.   torsemide (DEMADEX) 20 MG tablet Take 2 tablets (40 mg total) by mouth daily.   triamcinolone cream (KENALOG) 0.1 % Apply 1 application topically daily as needed (skin irritation).   vitamin B-12 (CYANOCOBALAMIN) 1000 MCG tablet Take 1,000 mcg by mouth daily.     Radiology:   CT angiogram of the chest 04/29/2017: Stable ascending thoracic aortic aneurysm measuring up to  4.3 cm. Recommend annual imaging followup by CTA or MRA. This recommendation follows 2010 ACCF/AHA/AATS/ACR/ASA/SCA/SCAI/SIR/STS/SVM Guidelines for the Diagnosis and Management of Patients with Thoracic Aortic Disease. Circulation. 2010; 121: e266-e369   Atherosclerotic changes thoracic aorta with irregular plaque/small ulceration thoracic-abdominal aortic junction similar to prior exam.   Atherosclerotic changes origin of great vessels most notable involving the left subclavian artery.   Atherosclerotic changes with narrowing superior mesenteric artery and right renal artery.   Superior aspect of abdominal aortic aneurysm incompletely assessed on the present exam.   Post CABG.  Prominent coronary artery calcification.   Aortic Atherosclerosis (ICD10-I70.0).  Coronary CTA 10/13/2019:  1. Tri- leaflet AV with annular area 540 mm2 upper limit for 26 mm Sapien 3 valve Despite large sinuses commissural calcium may suggest smaller valve better than 29 mm valve   2. Optimum angiographic angle for deployment LAO  16 Caudal 23 degrees   3.  Moderate aortic root dilatation 4.4 cm   4. Coronary arteries sufficient height above annulus for deployment with patent SVG to RCA/OM1 and patent LIMA to LAD   5.  LAA has been clipped with no contrast flow into the appendage  CTA Chest/abd/pelvis 10/13/2019:  Vascular/Lymphatic: Aortic atherosclerosis, with vascular findings and measurements pertinent to potential TAVR procedure, as detailed below. In addition, there is multifocal aneurysmal dilatation and short segment dissections of both the abdominal aorta and the common iliac arteries bilaterally. Multifocal fusiform aneurysmal dilatation of the infrarenal abdominal aorta measuring up to 3.8 x 3.1 cm proximally and 4.1 x 3.0 cm more distally, with multiple short segment dissection flaps within these regions. Aneurysmal dilatation of the common iliac arteries measuring 1.9 cm in diameter on the right and 1.6 cm in diameter on the left. No lymphadenopathy noted in the abdomen or pelvis.  IMPRESSION: 1. Vascular findings and measurements pertinent to potential TAVR procedure, as detailed above. Please note the presence of multiple aneurysms and short-segment dissections involving the infrarenal abdominal aorta as well as the common iliac arteries bilaterally. 2. Severe thickening calcification of the aortic valve, compatible with the reported clinical history of severe aortic stenosis. 3. Aortic atherosclerosis, in addition to left main and 3 vessel coronary artery disease. Status post median sternotomy for CABG including LIMA to the LAD. 4. Colonic diverticulosis without evidence of acute diverticulitis at this time. 5. Additional incidental findings, as above.  Cardiac Studies:   Nuclear stress test   11/29/2017: 1. Lexiscan stress test with low level exercise was performed. Exercise capacity was not assessed. Stress symptoms included dyspnea. Peak effect blood pressure was 114/66 mmHg. The  resting and stress electrocardiogram demonstrated normal sinus rhythm, RBBB, no resting arrhythmias and normal rest repolarization. Stress EKG is non diagnostic for ischemia as it is a pharmacologic stress. 2. The overall quality of the study is good. Left ventricular cavity is noted to be mildly enlarged on the rest and stress studies. Gated SPECT imaging demonstrated mild hypokinesis of the basal inferolateral and mid inferolateral myocardial wall(s). The left ventricular ejection fraction was calculated or visually estimated to be 69%. Medium sized, medium intensity, fixed perfusion defect in mid to basal inferolateral myocardium suggests old LCx territory infarct with no periinfarct ischemia. 3. Low risk study.   ABI 01/30/2019: Right: Resting right ankle-brachial index indicates moderate right lower extremity arterial disease. The right toe-brachial index is abnormal.  Left: Resting left ankle-brachial index indicates mild left lower extremity arterial disease. The left toe-brachial index is abnormal.   Right and left heart catheterization 09/29/2019:  Right heart catheterization RA 8/8, mean 6 mmHg.  RV 34/0, EDP 8 mmHg.;  PA 31/11, mean 18 mmHg.  PA saturation 62%.;  PW 13/25, mean 11 mmHg.  Aortic saturation 100%. Cardiac output 5.57, cardiac index 2.38, normal.  Normal SVR and PVR. LV: 1/4, EDP 14 mmHg.  Peak to peak aortic valve gradient was 41 with a mean gradient of 33.3 with a valve area of 1.09 cm.   Left heart catheterization Left main is calcific 50% to 60% stenosis distally and involves ostium of the large circumflex with a ostial 80% stenosis.  Gives origin to large OM1, OM 2. SVG to OM1 is widely patent and retrogradely fills the entire circumflex.  There is a 70 to 75% stenosis proximal to the graft insertion. LAD: Gives origin to large D1 and D2, mild diffuse disease, subtotally occluded to occluded in the midsegment. LIMA to LAD: Widely patent. RCA: Dominant and occluded in  the proximal segment. SVG to RCA: Widely patent.   Impression: Moderate to severe aortic stenosis, symptomatic.  Patient will benefit from aortic valve replacement and consideration for TAVR.  110 mL contrast utilized, fluoroscopy time 42.8 minutes.  TAVR 11/10/2019:  TRANSCATHETER AORTIC VALVE REPLACEMENT, LEFT SUBCLAVIAN USING EDWARDS SAPIEN 3 ULTRA 26 MM THV AORTIC VALVE.  Cardiac Monitor 11/11/2019 - 12/08/2019:  Sinus rhythm with episodes of atrial flutter vs atrial tachycardia Premature atrial contractions No evidence of high grade AV block or pauses.   Echocardiogram 06/08/2020:  Left ventricle cavity is normal in size. Moderate concentric hypertrophy  of the left ventricle. Normal global wall motion. Normal LV systolic  function with EF 64%. Doppler evidence of grade II (pseudonormal)  diastolic dysfunction, elevated LAP.  Well seated bioprosthetic aortic valve (35m Sapien) with trivial  perivalvular regurgitation. Mean pG 11 mmHg which is unchanged from prior  and likely normal post TAVR.  No evidence of pulmonary hypertension.  No significant change compared to previous study on 12/08/2019.   EKG  10/06/2020: Junctional escape rhythm at a rate of 52 bpm.  09/07/2020: Atrial fibrillation with controlled ventricular response at a rate of 60 bpm.  Left axis.  Right bundle branch block.  Bifascicular block.  Nonspecific T wave abnormality.  06/15/2020: Sinus rhythm at a rate of 81 bpm.  Left axis.  Right bundle branch block. Bifascicular block.   12/16/2019: Sinus bradycardia at a rate of 58 bpm.  Left axis, left anterior fascicular block.  Right bundle branch block.  Bifascicular block.  Compared to EKG 11/13/2019, previous nonspecific T wave abnormality of inferior leads not prominent.  11/13/2019 atrial fibrillation with controlled ventricular response at the rate of 76 bpm, left axis deviation, left anterior fascicular block.  Right bundle branch block.  Nonspecific T wave  flattening inferior leads.  Normal eQT interval.   Assessment     ICD-10-CM   1. Paroxysmal atrial fibrillation (HCC)  I48.0 EKG 12-Lead    2. Essential hypertension  I10     3. Chronic diastolic CHF (congestive heart failure) (HCC)  I50.32      No orders of the defined types were placed in this encounter.    Medications Discontinued During This Encounter  Medication Reason   leflunomide (ARAVA) 20 MG tablet Error     This patients CHA2DS2-VASc Score 5 (CHF, HTN, MI, Age) and yearly risk of stroke 7.2%.   NYHA Class II   Recommendations:   DMOROCCO GIPE is a  87y.o. Caucasian male  with vitamin B12 deficiency, mild  dementia, hyperlipidemia, hypertension, CAD, chronic stable angina status post CABG x 3 and Maze and LAA amputation in 2014, paroxysmal atrial fibrillation, obstructive sleep apnea not currently on CPAP, moderate aortic stenosis with thoracic aortic aneurysm and dilation of ascending aorta and follows Dr. Lilly Cove (No correlation with TTE with regards to aneurysm), venous stasis edema. Patient underwent TAVR 11/10/2019, at which time he presented in atypical atrial flutter but negative chronotropic were avoided due to age, aortic valve disease and bradycardia. He spontaneously converted to sinus, but presented to our office in atrial fibrillation with RVR and acute decompensated heart failure on 11/13/2019.  At this time he was started on increased dose of diuretics as well as amiodarone.   He was admitted 08/18/2020 - 08/23/2020 for vertebral fracture following a fall.  During admission patient was treated with IV Lasix for acute on chronic diastolic heart failure and diuresed 12.5 L.  He then followed up in the office 09/07/2020 and was in atrial fibrillation with controlled ventricular response.  Therefore recommended close follow-up with repeat EKG.  Also last office visit switch patient from furosemide to torsemide 40 mg twice daily.  Repeat BMP and BNP were improved compared  to previous.  He now presents for follow-up.  Patient's symptoms of chest pain are atypical at best, suspect musculoskeletal etiology.  However counseled patient and his wife regarding signs and symptoms that would warrant urgent or emergent evaluation.  Patient notably has taken nitroglycerin without relief of chest pain.  He has chronic bilateral leg edema which is relatively stable.  We will continue torsemide 40 mg twice daily.  Will defer management of wound and evaluation of ABI to wound management clinic.  EKG today reveals stable junctional escape rhythm.  Patient is relatively asymptomatic denying syncope, near syncope, dizziness.  He also reports fatigue has improved since hospitalization.  Therefore do not feel further evaluation or management is indicated at this time, and stable proceed with watchful waiting.  No changes are made to patient's medications at this time.  Patient will need SBE prophylaxis, in view of patient's allergy to amoxicillin, recommend azithromycin.  Follow-up in 3 months, sooner if needed.   Patient was seen in collaboration with Dr. Einar Gip and he is in agreement with the plan.   Alethia Berthold, PA-C 10/06/2020, 4:33 PM Office: [phone]

## 2020-10-06 ENCOUNTER — Other Ambulatory Visit: Payer: Self-pay

## 2020-10-06 ENCOUNTER — Ambulatory Visit: Payer: Medicare Other | Admitting: Student

## 2020-10-06 ENCOUNTER — Encounter: Payer: Self-pay | Admitting: Student

## 2020-10-06 VITALS — BP 118/58 | HR 53 | Wt 214.0 lb

## 2020-10-06 DIAGNOSIS — I480 Paroxysmal atrial fibrillation: Secondary | ICD-10-CM

## 2020-10-06 DIAGNOSIS — I5032 Chronic diastolic (congestive) heart failure: Secondary | ICD-10-CM

## 2020-10-06 DIAGNOSIS — I10 Essential (primary) hypertension: Secondary | ICD-10-CM

## 2020-10-07 DIAGNOSIS — N401 Enlarged prostate with lower urinary tract symptoms: Secondary | ICD-10-CM | POA: Diagnosis not present

## 2020-10-07 DIAGNOSIS — R338 Other retention of urine: Secondary | ICD-10-CM | POA: Diagnosis not present

## 2020-10-09 ENCOUNTER — Other Ambulatory Visit: Payer: Self-pay | Admitting: Cardiology

## 2020-10-10 ENCOUNTER — Ambulatory Visit (INDEPENDENT_AMBULATORY_CARE_PROVIDER_SITE_OTHER)
Admission: RE | Admit: 2020-10-10 | Discharge: 2020-10-10 | Disposition: A | Discharge status: Home / Self Care | Payer: Medicare Other | Source: Ambulatory Visit | Attending: Physician Assistant | Admitting: Physician Assistant

## 2020-10-10 ENCOUNTER — Other Ambulatory Visit (HOSPITAL_COMMUNITY): Payer: Self-pay | Admitting: Physician Assistant

## 2020-10-10 ENCOUNTER — Ambulatory Visit (HOSPITAL_COMMUNITY)
Admission: RE | Admit: 2020-10-10 | Discharge: 2020-10-10 | Disposition: A | Discharge status: Home / Self Care | Payer: Medicare Other | Source: Ambulatory Visit | Attending: Physician Assistant | Admitting: Physician Assistant

## 2020-10-10 ENCOUNTER — Other Ambulatory Visit: Payer: Self-pay

## 2020-10-10 ENCOUNTER — Other Ambulatory Visit (HOSPITAL_COMMUNITY): Payer: Self-pay | Admitting: *Deleted

## 2020-10-10 DIAGNOSIS — I739 Peripheral vascular disease, unspecified: Secondary | ICD-10-CM

## 2020-10-10 DIAGNOSIS — G4733 Obstructive sleep apnea (adult) (pediatric): Secondary | ICD-10-CM | POA: Diagnosis not present

## 2020-10-10 DIAGNOSIS — I259 Chronic ischemic heart disease, unspecified: Secondary | ICD-10-CM | POA: Diagnosis not present

## 2020-10-11 ENCOUNTER — Ambulatory Visit (HOSPITAL_COMMUNITY)
Admission: RE | Admit: 2020-10-11 | Discharge: 2020-10-11 | Disposition: A | Discharge status: Home / Self Care | Payer: Medicare Other | Source: Ambulatory Visit | Attending: Internal Medicine | Admitting: Internal Medicine

## 2020-10-11 DIAGNOSIS — R338 Other retention of urine: Secondary | ICD-10-CM | POA: Diagnosis not present

## 2020-10-11 DIAGNOSIS — M810 Age-related osteoporosis without current pathological fracture: Secondary | ICD-10-CM | POA: Insufficient documentation

## 2020-10-11 MED ORDER — DENOSUMAB 60 MG/ML ~~LOC~~ SOSY
60.0000 mg | PREFILLED_SYRINGE | Freq: Once | SUBCUTANEOUS | Status: AC
  Administered 2020-10-11: 60 mg via SUBCUTANEOUS

## 2020-10-11 MED ORDER — DENOSUMAB 60 MG/ML ~~LOC~~ SOSY
PREFILLED_SYRINGE | SUBCUTANEOUS | Status: AC
  Filled 2020-10-11: qty 1

## 2020-10-13 DIAGNOSIS — L89611 Pressure ulcer of right heel, stage 1: Secondary | ICD-10-CM | POA: Diagnosis not present

## 2020-10-13 DIAGNOSIS — M8008XD Age-related osteoporosis with current pathological fracture, vertebra(e), subsequent encounter for fracture with routine healing: Secondary | ICD-10-CM | POA: Diagnosis not present

## 2020-10-13 DIAGNOSIS — R338 Other retention of urine: Secondary | ICD-10-CM | POA: Diagnosis not present

## 2020-10-13 DIAGNOSIS — M353 Polymyalgia rheumatica: Secondary | ICD-10-CM | POA: Diagnosis not present

## 2020-10-13 DIAGNOSIS — N401 Enlarged prostate with lower urinary tract symptoms: Secondary | ICD-10-CM | POA: Diagnosis not present

## 2020-10-13 DIAGNOSIS — D631 Anemia in chronic kidney disease: Secondary | ICD-10-CM | POA: Diagnosis not present

## 2020-10-13 DIAGNOSIS — N1831 Chronic kidney disease, stage 3a: Secondary | ICD-10-CM | POA: Diagnosis not present

## 2020-10-13 DIAGNOSIS — I5021 Acute systolic (congestive) heart failure: Secondary | ICD-10-CM | POA: Diagnosis not present

## 2020-10-13 DIAGNOSIS — I130 Hypertensive heart and chronic kidney disease with heart failure and stage 1 through stage 4 chronic kidney disease, or unspecified chronic kidney disease: Secondary | ICD-10-CM | POA: Diagnosis not present

## 2020-10-13 DIAGNOSIS — I5033 Acute on chronic diastolic (congestive) heart failure: Secondary | ICD-10-CM | POA: Diagnosis not present

## 2020-10-13 DIAGNOSIS — S81802D Unspecified open wound, left lower leg, subsequent encounter: Secondary | ICD-10-CM | POA: Diagnosis not present

## 2020-10-13 DIAGNOSIS — E785 Hyperlipidemia, unspecified: Secondary | ICD-10-CM | POA: Diagnosis not present

## 2020-10-13 DIAGNOSIS — I712 Thoracic aortic aneurysm, without rupture, unspecified: Secondary | ICD-10-CM | POA: Diagnosis not present

## 2020-10-13 DIAGNOSIS — I700 Atherosclerosis of aorta: Secondary | ICD-10-CM | POA: Diagnosis not present

## 2020-10-13 DIAGNOSIS — I480 Paroxysmal atrial fibrillation: Secondary | ICD-10-CM | POA: Diagnosis not present

## 2020-10-13 DIAGNOSIS — N39498 Other specified urinary incontinence: Secondary | ICD-10-CM | POA: Diagnosis not present

## 2020-10-17 DIAGNOSIS — E785 Hyperlipidemia, unspecified: Secondary | ICD-10-CM | POA: Diagnosis not present

## 2020-10-17 DIAGNOSIS — I10 Essential (primary) hypertension: Secondary | ICD-10-CM | POA: Diagnosis not present

## 2020-10-18 DIAGNOSIS — I259 Chronic ischemic heart disease, unspecified: Secondary | ICD-10-CM | POA: Diagnosis not present

## 2020-10-18 DIAGNOSIS — G4733 Obstructive sleep apnea (adult) (pediatric): Secondary | ICD-10-CM | POA: Diagnosis not present

## 2020-10-19 ENCOUNTER — Encounter (HOSPITAL_BASED_OUTPATIENT_CLINIC_OR_DEPARTMENT_OTHER): Payer: Medicare Other | Attending: Physician Assistant | Admitting: Physician Assistant

## 2020-10-19 ENCOUNTER — Other Ambulatory Visit: Payer: Self-pay

## 2020-10-19 ENCOUNTER — Encounter (HOSPITAL_COMMUNITY): Payer: Medicare Other

## 2020-10-19 DIAGNOSIS — I5033 Acute on chronic diastolic (congestive) heart failure: Secondary | ICD-10-CM | POA: Diagnosis not present

## 2020-10-19 DIAGNOSIS — N3001 Acute cystitis with hematuria: Secondary | ICD-10-CM | POA: Diagnosis not present

## 2020-10-19 DIAGNOSIS — M0600 Rheumatoid arthritis without rheumatoid factor, unspecified site: Secondary | ICD-10-CM | POA: Diagnosis not present

## 2020-10-19 DIAGNOSIS — I70221 Atherosclerosis of native arteries of extremities with rest pain, right leg: Secondary | ICD-10-CM | POA: Diagnosis not present

## 2020-10-19 DIAGNOSIS — G9340 Encephalopathy, unspecified: Secondary | ICD-10-CM | POA: Diagnosis not present

## 2020-10-19 DIAGNOSIS — I517 Cardiomegaly: Secondary | ICD-10-CM | POA: Diagnosis not present

## 2020-10-19 DIAGNOSIS — M109 Gout, unspecified: Secondary | ICD-10-CM | POA: Diagnosis not present

## 2020-10-19 DIAGNOSIS — R319 Hematuria, unspecified: Secondary | ICD-10-CM | POA: Diagnosis not present

## 2020-10-19 DIAGNOSIS — N136 Pyonephrosis: Secondary | ICD-10-CM | POA: Diagnosis not present

## 2020-10-19 DIAGNOSIS — L03116 Cellulitis of left lower limb: Secondary | ICD-10-CM | POA: Diagnosis not present

## 2020-10-19 DIAGNOSIS — L89610 Pressure ulcer of right heel, unstageable: Secondary | ICD-10-CM | POA: Diagnosis not present

## 2020-10-19 DIAGNOSIS — L97519 Non-pressure chronic ulcer of other part of right foot with unspecified severity: Secondary | ICD-10-CM | POA: Diagnosis not present

## 2020-10-19 DIAGNOSIS — L97829 Non-pressure chronic ulcer of other part of left lower leg with unspecified severity: Secondary | ICD-10-CM | POA: Diagnosis not present

## 2020-10-19 DIAGNOSIS — N281 Cyst of kidney, acquired: Secondary | ICD-10-CM | POA: Diagnosis not present

## 2020-10-19 DIAGNOSIS — I4891 Unspecified atrial fibrillation: Secondary | ICD-10-CM | POA: Diagnosis not present

## 2020-10-19 DIAGNOSIS — M159 Polyosteoarthritis, unspecified: Secondary | ICD-10-CM | POA: Diagnosis not present

## 2020-10-19 DIAGNOSIS — Z951 Presence of aortocoronary bypass graft: Secondary | ICD-10-CM | POA: Diagnosis not present

## 2020-10-19 DIAGNOSIS — J9811 Atelectasis: Secondary | ICD-10-CM | POA: Diagnosis not present

## 2020-10-19 DIAGNOSIS — I70234 Atherosclerosis of native arteries of right leg with ulceration of heel and midfoot: Secondary | ICD-10-CM | POA: Diagnosis not present

## 2020-10-19 DIAGNOSIS — Z952 Presence of prosthetic heart valve: Secondary | ICD-10-CM | POA: Diagnosis not present

## 2020-10-19 DIAGNOSIS — R936 Abnormal findings on diagnostic imaging of limbs: Secondary | ICD-10-CM | POA: Diagnosis not present

## 2020-10-19 DIAGNOSIS — I70239 Atherosclerosis of native arteries of right leg with ulceration of unspecified site: Secondary | ICD-10-CM | POA: Diagnosis not present

## 2020-10-19 DIAGNOSIS — Z20822 Contact with and (suspected) exposure to covid-19: Secondary | ICD-10-CM | POA: Diagnosis not present

## 2020-10-19 DIAGNOSIS — R0902 Hypoxemia: Secondary | ICD-10-CM | POA: Diagnosis not present

## 2020-10-19 DIAGNOSIS — I130 Hypertensive heart and chronic kidney disease with heart failure and stage 1 through stage 4 chronic kidney disease, or unspecified chronic kidney disease: Secondary | ICD-10-CM | POA: Diagnosis not present

## 2020-10-19 DIAGNOSIS — D649 Anemia, unspecified: Secondary | ICD-10-CM | POA: Diagnosis not present

## 2020-10-19 DIAGNOSIS — L97419 Non-pressure chronic ulcer of right heel and midfoot with unspecified severity: Secondary | ICD-10-CM | POA: Diagnosis not present

## 2020-10-19 DIAGNOSIS — Z66 Do not resuscitate: Secondary | ICD-10-CM | POA: Diagnosis not present

## 2020-10-19 DIAGNOSIS — I2510 Atherosclerotic heart disease of native coronary artery without angina pectoris: Secondary | ICD-10-CM | POA: Diagnosis present

## 2020-10-19 DIAGNOSIS — B9689 Other specified bacterial agents as the cause of diseases classified elsewhere: Secondary | ICD-10-CM | POA: Diagnosis present

## 2020-10-19 DIAGNOSIS — Y846 Urinary catheterization as the cause of abnormal reaction of the patient, or of later complication, without mention of misadventure at the time of the procedure: Secondary | ICD-10-CM | POA: Diagnosis present

## 2020-10-19 DIAGNOSIS — R531 Weakness: Secondary | ICD-10-CM | POA: Diagnosis not present

## 2020-10-19 DIAGNOSIS — I7143 Infrarenal abdominal aortic aneurysm, without rupture: Secondary | ICD-10-CM | POA: Diagnosis not present

## 2020-10-19 DIAGNOSIS — I70291 Other atherosclerosis of native arteries of extremities, right leg: Secondary | ICD-10-CM | POA: Diagnosis not present

## 2020-10-19 DIAGNOSIS — N179 Acute kidney failure, unspecified: Secondary | ICD-10-CM | POA: Diagnosis not present

## 2020-10-19 DIAGNOSIS — G4733 Obstructive sleep apnea (adult) (pediatric): Secondary | ICD-10-CM | POA: Diagnosis not present

## 2020-10-19 DIAGNOSIS — N390 Urinary tract infection, site not specified: Secondary | ICD-10-CM | POA: Diagnosis not present

## 2020-10-19 DIAGNOSIS — I259 Chronic ischemic heart disease, unspecified: Secondary | ICD-10-CM | POA: Diagnosis not present

## 2020-10-19 DIAGNOSIS — I5021 Acute systolic (congestive) heart failure: Secondary | ICD-10-CM | POA: Diagnosis not present

## 2020-10-19 DIAGNOSIS — Z23 Encounter for immunization: Secondary | ICD-10-CM | POA: Diagnosis not present

## 2020-10-19 DIAGNOSIS — L97919 Non-pressure chronic ulcer of unspecified part of right lower leg with unspecified severity: Secondary | ICD-10-CM | POA: Diagnosis not present

## 2020-10-19 DIAGNOSIS — N138 Other obstructive and reflux uropathy: Secondary | ICD-10-CM | POA: Diagnosis not present

## 2020-10-19 DIAGNOSIS — L97411 Non-pressure chronic ulcer of right heel and midfoot limited to breakdown of skin: Secondary | ICD-10-CM | POA: Diagnosis not present

## 2020-10-19 DIAGNOSIS — R509 Fever, unspecified: Secondary | ICD-10-CM | POA: Diagnosis not present

## 2020-10-19 DIAGNOSIS — T83511A Infection and inflammatory reaction due to indwelling urethral catheter, initial encounter: Secondary | ICD-10-CM | POA: Diagnosis not present

## 2020-10-19 DIAGNOSIS — B965 Pseudomonas (aeruginosa) (mallei) (pseudomallei) as the cause of diseases classified elsewhere: Secondary | ICD-10-CM | POA: Diagnosis present

## 2020-10-19 DIAGNOSIS — I482 Chronic atrial fibrillation, unspecified: Secondary | ICD-10-CM | POA: Diagnosis not present

## 2020-10-19 DIAGNOSIS — Z9862 Peripheral vascular angioplasty status: Secondary | ICD-10-CM | POA: Diagnosis not present

## 2020-10-19 DIAGNOSIS — N401 Enlarged prostate with lower urinary tract symptoms: Secondary | ICD-10-CM | POA: Diagnosis present

## 2020-10-19 DIAGNOSIS — S81801A Unspecified open wound, right lower leg, initial encounter: Secondary | ICD-10-CM | POA: Diagnosis not present

## 2020-10-19 DIAGNOSIS — I959 Hypotension, unspecified: Secondary | ICD-10-CM | POA: Diagnosis not present

## 2020-10-19 DIAGNOSIS — M069 Rheumatoid arthritis, unspecified: Secondary | ICD-10-CM | POA: Diagnosis not present

## 2020-10-19 DIAGNOSIS — G9341 Metabolic encephalopathy: Secondary | ICD-10-CM | POA: Diagnosis not present

## 2020-10-19 DIAGNOSIS — N182 Chronic kidney disease, stage 2 (mild): Secondary | ICD-10-CM | POA: Diagnosis present

## 2020-10-19 DIAGNOSIS — M353 Polymyalgia rheumatica: Secondary | ICD-10-CM | POA: Diagnosis not present

## 2020-10-19 NOTE — Progress Notes (Addendum)
KJELL, BRANNEN IMarland Kitchen ([id number]) Visit Report for 10/19/2020 Chief Complaint Document Details Patient Name: Date of Service: [name] ID I. 10/19/2020 2:15 PM Medical Record Number: [id number] Patient Account Number: [id number] Date of Birth/Sex: Treating RN: 07-17-1926 (87 y.o. [name] Primary Care Provider: [name] Other Clinician: Referring Provider: Treating Provider/Extender: Eli Hose in Treatment: 4 Information Obtained from: Patient Chief Complaint Pressure ulcer right heel Electronic Signature(s) Signed: 10/19/2020 1:37:44 PM By: [name] PA-C Entered By: [name] on 10/19/2020 13:37:44 -------------------------------------------------------------------------------- HPI Details Patient Name: Date of Service: [name], [name] 10/19/2020 2:15 PM Medical Record Number: [id number] Patient Account Number: [id number] Date of Birth/Sex: Treating RN: 26-Feb-1926 (87 y.o. [name] Primary Care Provider: [name] Other Clinician: Referring Provider: Treating Provider/Extender: Eli Hose in Treatment: 4 History of Present Illness HPI Description: 09/21/2020 upon evaluation today patient appears to be doing somewhat poorly in regard to a wound in the right lateral heel location. This fortunately does not appear to be too bad but nonetheless is definitely a spot that we need to work on try to get healed for him. Has been here for quite some time. Fortunately there does not appear to be any evidence of active infection at this time which is great news. With that being said I do believe that he has some evidence here of peripheral vascular disease this is minimal his ABI 0.87. Other than that he does have a history of BPH, chronic kidney disease stage III, and hypertension. Currently he has been using different medication such as Silvadene or antibiotic ointment to try to help treat things.  This just is not helping out as efficiently as it should be. Of note patient's history includes that of a triple bypass in 2010, a kyphoplasty at L1 which was done 8 months ago, unfortunately he had a fracture 2 months ago which she sustained a T7/T8 fracture to his spine which ended up with him being in the hospital. It is in that time since then that he developed this wound roughly 2 to 3 months ago is not exactly sure when. He does typically wear compression socks in the 20 to 30 mmHg range. 09/28/2020 upon evaluation today patient presents for follow-up and overall seems to be doing more poorly based on what I am seeing currently. His ABI last time was measured at 0.87 but I am beginning to think this may be falsely elevated due to the fact that there is more eschar and subsequently a concern here of vascular compromise that may be part of her issue. I think that we need to avoid any additional sharp debridement and make a referral to vascular surgery as soon as possible here. Subsequently also went ahead and did discuss the possibility of starting the patient on Santyl instead of the collagen in order to help clean up the surface of the wound this will take the place of sharp debridement currently. 10/19/2020 upon evaluation today patient's wound actually appears to be doing okay although he has an area on the medial aspect of his heel that is only been showing up for the past couple of days this is more of a blister that has ruptured. The good news is there is no deep wound here which is good. Fortunately there is no signs of active infection at this time also good news. With that being said however the blood flow was  not good with an ABI of 0.67 on the right it was around 1 on the left nonetheless I think he does need to see vascular we put that referral in last week on the 12th Dr. Dellia Nims reviewed that for me and made that recommendations. Nonetheless the patient has not heard anything from  vascular yet as far as getting this scheduled. I think this needs to be done sooner rather than later as I am concerned about the possibility of a limb threatening situation if we do not get this under control. Electronic Signature(s) Signed: 10/19/2020 3:36:13 PM By: [name] PA-C Entered By: [name] on 10/19/2020 15:36:12 -------------------------------------------------------------------------------- Physical Exam Details Patient Name: Date of Service: [name], [name] 10/19/2020 2:15 PM Medical Record Number: [id number] Patient Account Number: [id number] Date of Birth/Sex: Treating RN: [birth year] (87 y.o. [name] Primary Care Provider: [name] Other Clinician: Referring Provider: Treating Provider/Extender: [name] in Treatment: 4 Constitutional Well-nourished and well-hydrated in no acute distress. Respiratory normal breathing without difficulty. Psychiatric this patient is able to make decisions and demonstrates good insight into disease process. Alert and Oriented x 3. pleasant and cooperative. Notes Upon inspection patient's wound bed showed signs of some good granulation there did not appear to be any need for Santyl going forward this is good news. With that being said the blister on the medial aspect of his heel has me concerned as it could potentially open up more he does have a bunny boot type of thing to wear at this point which I think could be beneficial for him and in fact I would strongly encourage him to continue using that he tells me it actually feels better when he does. Electronic Signature(s) Signed: 10/19/2020 3:36:46 PM By: [name] PA-C Entered By: [name] on 10/19/2020 15:36:45 -------------------------------------------------------------------------------- Physician Orders Details Patient Name: Date of Service: [name], [name] 10/19/2020 2:15 PM Medical Record Number:  [id number] Patient Account Number: [id number] Date of Birth/Sex: Treating RN: 24-Nov-1926 (87 y.o. [name] Primary Care Provider: [name] Other Clinician: Referring Provider: Treating Provider/Extender: Eli Hose in Treatment: 4 Verbal / Phone Orders: No Diagnosis Coding ICD-10 Coding Code Description L89.610 Pressure ulcer of right heel, unstageable I73.89 Other specified peripheral vascular diseases N40.1 Benign prostatic hyperplasia with lower urinary tract symptoms N18.30 Chronic kidney disease, stage 3 unspecified I10 Essential (primary) hypertension Follow-up Appointments ppointment in 1 week. Jeri Cos, PA Return A Vascular Referral made 10/12/20 with [facility] [phone] Bathing/ Shower/ Hygiene May shower and wash wound with soap and water. - wash wound last in shower with dial antibacterial soap, pat dry. Edema Control - Lymphedema / SCD / Other Elevate legs to the level of the heart or above for 30 minutes daily and/or when sitting, a frequency of: - 3-4 times a day through out the day. Avoid standing for long periods of time. Moisturize legs daily. - both legs every night before bed. Off-Loading Other: - Ensure to float right heel while resting in bed or chair with bunny boots. No not wear sandals to prevent friction. Ensure to wear tennis shoes. Home Health New wound care orders this week; continue Home Health for wound care. May utilize formulary equivalent dressing for wound treatment orders unless otherwise specified. - Primary dressing changed to Calcium Alginate with Silver Other Home Health Orders/Instructions: - [facility] Wound Treatment Wound #1 -  Calcaneus Wound Laterality: Right, Lateral Cleanser: Normal Saline Every Other Day/30 Days Discharge Instructions: Cleanse the wound with Normal Saline prior to applying a clean dressing using gauze sponges, not tissue or cotton balls. Cleanser: Soap and Water  [facility]) Every Other Day/30 Days Discharge Instructions: May shower and wash wound with dial antibacterial soap and water prior to dressing change. Peri-Wound Care: Skin Prep (Home Health) (Generic) Every Other Day/30 Days Discharge Instructions: Use skin prep as directed Peri-Wound Care: Zinc Oxide Ointment 30g tube [facility]) Every Other Day/30 Days Discharge Instructions: **apply only around the wound as needed for moisture noted to good skin.**Apply Zinc Oxide to periwound with each dressing change Prim Dressing: KerraCel Ag Gelling Fiber Dressing, 2x2 in (silver alginate) (Home Health) Every Other Day/30 Days ary Discharge Instructions: Apply silver alginate to wound bed as instructed Secondary Dressing: Zetuvit Plus Silicone Border Dressing 4x4 (in/in) ([place]) Every Other Day/30 Days Discharge Instructions: Apply silicone border over primary dressing as directed. Wound #2 - Calcaneus Wound Laterality: Right, Medial Cleanser: Normal Saline Every Other Day/30 Days Discharge Instructions: Cleanse the wound with Normal Saline prior to applying a clean dressing using gauze sponges, not tissue or cotton balls. Cleanser: Soap and Water [facility]) Every Other Day/30 Days Discharge Instructions: May shower and wash wound with dial antibacterial soap and water prior to dressing change. Peri-Wound Care: Skin Prep (Home Health) (Generic) Every Other Day/30 Days Discharge Instructions: Use skin prep as directed Peri-Wound Care: Zinc Oxide Ointment 30g tube [facility]) Every Other Day/30 Days Discharge Instructions: **apply only around the wound as needed for moisture noted to good skin.**Apply Zinc Oxide to periwound with each dressing change Prim Dressing: KerraCel Ag Gelling Fiber Dressing, 2x2 in (silver alginate) (Home Health) Every Other Day/30 Days ary Discharge Instructions: Apply silver alginate to wound bed as instructed Secondary Dressing: Zetuvit Plus Silicone Border  Dressing 4x4 (in/in) ([place]) Every Other Day/30 Days Discharge Instructions: Apply silicone border over primary dressing as directed. Electronic Signature(s) Signed: 10/19/2020 4:03:51 PM By: [name] PA-C Signed: 10/19/2020 5:50:03 PM By: Lorrin Jackson Entered By: Lorrin Jackson on 10/19/2020 15:27:29 -------------------------------------------------------------------------------- Problem List Details Patient Name: Date of Service: [name], [name] 10/19/2020 2:15 PM Medical Record Number: [id number] Patient Account Number: [id number] Date of Birth/Sex: Treating RN: 1926-10-06 (87 y.o. [name] Primary Care Provider: [name] Other Clinician: Referring Provider: Treating Provider/Extender: Eli Hose in Treatment: 4 Active Problems ICD-10 Encounter Code Description Active Date MDM Diagnosis L89.610 Pressure ulcer of right heel, unstageable 09/21/2020 No Yes I73.89 Other specified peripheral vascular diseases 09/21/2020 No Yes N40.1 Benign prostatic hyperplasia with lower urinary tract symptoms 09/21/2020 No Yes N18.30 Chronic kidney disease, stage 3 unspecified 09/21/2020 No Yes I10 Essential (primary) hypertension 09/21/2020 No Yes Inactive Problems Resolved Problems Electronic Signature(s) Signed: 10/19/2020 1:37:38 PM By: [name] PA-C Entered By: [name] on 10/19/2020 13:37:38 -------------------------------------------------------------------------------- Progress Note Details Patient Name: Date of Service: [name], [name] 10/19/2020 2:15 PM Medical Record Number: [id number] Patient Account Number: [id number] Date of Birth/Sex: Treating RN: [birth year] (87 y.o. [name] Primary Care Provider: [name] Other Clinician: Referring Provider: Treating Provider/Extender: Eli Hose in Treatment: 4 Subjective Chief Complaint Information obtained from  Patient Pressure ulcer right heel History of Present Illness (HPI) 09/21/2020 upon evaluation today patient appears to be doing somewhat poorly in regard to a wound in the  right lateral heel location. This fortunately does not appear to be too bad but nonetheless is definitely a spot that we need to work on try to get healed for him. Has been here for quite some time. Fortunately there does not appear to be any evidence of active infection at this time which is great news. With that being said I do believe that he has some evidence here of peripheral vascular disease this is minimal his ABI 0.87. Other than that he does have a history of BPH, chronic kidney disease stage III, and hypertension. Currently he has been using different medication such as Silvadene or antibiotic ointment to try to help treat things. This just is not helping out as efficiently as it should be. Of note patient's history includes that of a triple bypass in 2010, a kyphoplasty at L1 which was done 8 months ago, unfortunately he had a fracture 2 months ago which she sustained a T7/T8 fracture to his spine which ended up with him being in the hospital. It is in that time since then that he developed this wound roughly 2 to 3 months ago is not exactly sure when. He does typically wear compression socks in the 20 to 30 mmHg range. 09/28/2020 upon evaluation today patient presents for follow-up and overall seems to be doing more poorly based on what I am seeing currently. His ABI last time was measured at 0.87 but I am beginning to think this may be falsely elevated due to the fact that there is more eschar and subsequently a concern here of vascular compromise that may be part of her issue. I think that we need to avoid any additional sharp debridement and make a referral to vascular surgery as soon as possible here. Subsequently also went ahead and did discuss the possibility of starting the patient on Santyl instead of the collagen  in order to help clean up the surface of the wound this will take the place of sharp debridement currently. 10/19/2020 upon evaluation today patient's wound actually appears to be doing okay although he has an area on the medial aspect of his heel that is only been showing up for the past couple of days this is more of a blister that has ruptured. The good news is there is no deep wound here which is good. Fortunately there is no signs of active infection at this time also good news. With that being said however the blood flow was not good with an ABI of 0.67 on the right it was around 1 on the left nonetheless I think he does need to see vascular we put that referral in last week on the 12th Dr. Dellia Nims reviewed that for me and made that recommendations. Nonetheless the patient has not heard anything from vascular yet as far as getting this scheduled. I think this needs to be done sooner rather than later as I am concerned about the possibility of a limb threatening situation if we do not get this under control. Objective Constitutional Well-nourished and well-hydrated in no acute distress. Vitals Time Taken: 2:33 PM, Height: 72 in, Weight: 223 lbs, BMI: 30.2, Temperature: 97.8 F, Pulse: 66 bpm, Respiratory Rate: 20 breaths/min, Blood Pressure: 146/64 mmHg. Respiratory normal breathing without difficulty. Psychiatric this patient is able to make decisions and demonstrates good insight into disease process. Alert and Oriented x 3. pleasant and cooperative. General Notes: Upon inspection patient's wound bed showed signs of some good granulation there did not appear to be any need  for Santyl going forward this is good news. With that being said the blister on the medial aspect of his heel has me concerned as it could potentially open up more he does have a bunny boot type of thing to wear at this point which I think could be beneficial for him and in fact I would strongly encourage him to continue  using that he tells me it actually feels better when he does. Integumentary (Hair, Skin) Wound #1 status is Open. Original cause of wound was Pressure Injury. The date acquired was: 06/08/2020. The wound has been in treatment 4 weeks. The wound is located on the Right,Lateral Calcaneus. The wound measures 1.4cm length x 1.6cm width x 0.1cm depth; 1.759cm^2 area and 0.176cm^3 volume. There is Fat Layer (Subcutaneous Tissue) exposed. There is no tunneling or undermining noted. There is a medium amount of serosanguineous drainage noted. The wound margin is distinct with the outline attached to the wound base. There is large (67-100%) red granulation within the wound bed. There is a small (1-33%) amount of necrotic tissue within the wound bed including Adherent Slough. General Notes: maceration noted Wound #2 status is Open. Original cause of wound was Pressure Injury. The date acquired was: 10/17/2020. The wound is located on the Right,Medial Calcaneus. The wound measures 1.2cm length x 1cm width x 0.1cm depth; 0.942cm^2 area and 0.094cm^3 volume. There is Fat Layer (Subcutaneous Tissue) exposed. There is no tunneling or undermining noted. There is a medium amount of serous drainage noted. The wound margin is distinct with the outline attached to the wound base. There is large (67-100%) red, pink granulation within the wound bed. There is no necrotic tissue within the wound bed. General Notes: maceration noted Assessment Active Problems ICD-10 Pressure ulcer of right heel, unstageable Other specified peripheral vascular diseases Benign prostatic hyperplasia with lower urinary tract symptoms Chronic kidney disease, stage 3 unspecified Essential (primary) hypertension Plan Follow-up Appointments: Return Appointment in 1 week. Jeri Cos, PA Vascular Referral made 10/12/20 with [facility] Ve[facility] [phone] Bathing/ Shower/ Hygiene: May shower and wash wound with soap and water. - wash wound  last in shower with dial antibacterial soap, pat dry. Edema Control - Lymphedema / SCD / Other: Elevate legs to the level of the heart or above for 30 minutes daily and/or when sitting, a frequency of: - 3-4 times a day through out the day. Avoid standing for long periods of time. Moisturize legs daily. - both legs every night before bed. Off-Loading: Other: - Ensure to float right heel while resting in bed or chair with bunny boots. No not wear sandals to prevent friction. Ensure to wear tennis shoes. Home Health: New wound care orders this week; continue Home Health for wound care. May utilize formulary equivalent dressing for wound treatment orders unless otherwise specified. - Primary dressing changed to Calcium Alginate with Silver Other Home Health Orders/Instructions: - [place] #1: - Calcaneus Wound Laterality: Right, Lateral Cleanser: Normal Saline Every Other Day/30 Days Discharge Instructions: Cleanse the wound with Normal Saline prior to applying a clean dressing using gauze sponges, not tissue or cotton balls. Cleanser: Soap and Water [facility]) Every Other Day/30 Days Discharge Instructions: May shower and wash wound with dial antibacterial soap and water prior to dressing change. Peri-Wound Care: Skin Prep [facility]) (Generic) Every Other Day/30 Days Discharge Instructions: Use skin prep as directed Peri-Wound Care: Zinc Oxide Ointment 30g tube [facility]) Every Other Day/30 Days Discharge Instructions: **apply only around the wound  as needed for moisture noted to good skin.**Apply Zinc Oxide to periwound with each dressing change Prim Dressing: KerraCel Ag Gelling Fiber Dressing, 2x2 in (silver alginate) (Home Health) Every Other Day/30 Days ary Discharge Instructions: Apply silver alginate to wound bed as instructed Secondary Dressing: Zetuvit Plus Silicone Border Dressing 4x4 (in/in) ([place]) Every Other Day/30 Days Discharge Instructions: Apply  silicone border over primary dressing as directed. WOUND #2: - Calcaneus Wound Laterality: Right, Medial Cleanser: Normal Saline Every Other Day/30 Days Discharge Instructions: Cleanse the wound with Normal Saline prior to applying a clean dressing using gauze sponges, not tissue or cotton balls. Cleanser: Soap and Water [facility]) Every Other Day/30 Days Discharge Instructions: May shower and wash wound with dial antibacterial soap and water prior to dressing change. Peri-Wound Care: Skin Prep (Home Health) (Generic) Every Other Day/30 Days Discharge Instructions: Use skin prep as directed Peri-Wound Care: Zinc Oxide Ointment 30g tube [facility]) Every Other Day/30 Days Discharge Instructions: **apply only around the wound as needed for moisture noted to good skin.**Apply Zinc Oxide to periwound with each dressing change Prim Dressing: KerraCel Ag Gelling Fiber Dressing, 2x2 in (silver alginate) (Home Health) Every Other Day/30 Days ary Discharge Instructions: Apply silver alginate to wound bed as instructed Secondary Dressing: Zetuvit Plus Silicone Border Dressing 4x4 (in/in) ([place]) Every Other Day/30 Days Discharge Instructions: Apply silicone border over primary dressing as directed. 1. Would recommend currently that we go ahead and initiate a continuation of treatment with the border foam dressing to cover him and recommend however we switch to a silver alginate try to keep both areas nice and dry. I think a larger border foam could do well as far as covering both wounds with 1 and given him good padding in the area without being offered. 2. I am also going to recommend that we have the patient continue with the zinc oxide around the edges of the wound of anything from breaking down. 3. Also I am good recommend based on what we are seeing that the patient considered getting the derma saver protective sleeves for his arms and legs this would help prevent some of the nicks and  scrapes as well as skin tears that he experiences quite frequently. 4. I am also going to recommend that they could get the Allevyn life border foam dressings 6 x 6 inches off of [place] which will cover the whole heel and heel on the wound but more protection I think this will be much better in the long run. We will see patient back for reevaluation in 1 week here in the clinic. If anything worsens or changes patient will contact our office for additional recommendations. Electronic Signature(s) Signed: 10/19/2020 3:37:53 PM By: [name] PA-C Entered By: [name] on 10/19/2020 15:37:53 -------------------------------------------------------------------------------- SuperBill Details Patient Name: Date of Service: [name], [name] 10/19/2020 Medical Record Number: [id number] Patient Account Number: [id number] Date of Birth/Sex: Treating RN: 19-Dec-1926 (87 y.o. [name] Primary Care Provider: [name] Other Clinician: Referring Provider: Treating Provider/Extender: Eli Hose in Treatment: 4 Diagnosis Coding ICD-10 Codes Code Description L89.610 Pressure ulcer of right heel, unstageable I73.89 Other specified peripheral vascular diseases N40.1 Benign prostatic hyperplasia with lower urinary tract symptoms N18.30 Chronic kidney disease, stage 3 unspecified I10 Essential (primary) hypertension Facility Procedures CPT4 Code: [id number] Description: 11572 - WOUND CARE VISIT-LEV 3 EST PT Modifier: Quantity: 1 Physician Procedures : CPT4 Code Description Modifier [id number] -  WC PHYS LEVEL 4 - EST PT ICD-10 Diagnosis Description L89.610 Pressure ulcer of right heel, unstageable I73.89 Other specified peripheral vascular diseases N40.1 Benign prostatic hyperplasia with lower  urinary tract symptoms N18.30 Chronic kidney disease, stage 3 unspecified Quantity: 1 Electronic Signature(s) Signed: 10/19/2020 3:38:03 PM By: [name]  PA-C Entered By: [name] on 10/19/2020 15:38:02

## 2020-10-19 NOTE — Progress Notes (Signed)
ABHIJOT, STRAUGHTER I. ([id number]) Visit Report for 10/19/2020 Arrival Information Details Patient Name: Date of Service: [name] [name] ID I. 10/19/2020 2:15 PM Medical Record Number: [id number] Patient Account Number: [id number] Date of Birth/Sex: Treating RN: [birth year] (87 y.o. [name] Primary Care Lanita Stammen: [name] Other Clinician: Referring Remee Charley: Treating Kyndall Chaplin/Extender: Eli Hose in Treatment: 4 Visit Information History Since Last Visit All ordered tests and consults were completed: Yes Patient Arrived: Wheel Chair Added or deleted any medications: No Arrival Time: 14:33 Any new allergies or adverse reactions: No Accompanied By: wife Had a fall or experienced change in No Transfer Assistance: Manual activities of daily living that may affect Patient Identification Verified: Yes risk of falls: Secondary Verification Process Completed: Yes Signs or symptoms of abuse/neglect since last visito No Patient Requires Transmission-Based Precautions: No Hospitalized since last visit: No Patient Has Alerts: Yes Implantable device outside of the clinic excluding No Patient Alerts: Patient on Blood Thinner cellular tissue based products placed in the center since last visit: Has Dressing in Place as Prescribed: Yes Pain Present Now: No Electronic Signature(s) Signed: 10/19/2020 5:50:03 PM By: Lorrin Jackson Entered By: Lorrin Jackson on 10/19/2020 14:34:12 -------------------------------------------------------------------------------- Clinic Level of Care Assessment Details Patient Name: Date of Service: [name] [name] ID I. 10/19/2020 2:15 PM Medical Record Number: [id number] Patient Account Number: [id number] Date of Birth/Sex: Treating RN: 1926/04/26 (87 y.o. [name] Primary Care Katelee Schupp: [name] Other Clinician: Referring Storm Sovine: Treating Melizza Kanode/Extender: Eli Hose in Treatment:  4 Clinic Level of Care Assessment Items TOOL 4 Quantity Score X- 1 0 Use when only an EandM is performed on FOLLOW-UP visit ASSESSMENTS - Nursing Assessment / Reassessment X- 1 10 Reassessment of Co-morbidities (includes updates in patient status) X- 1 5 Reassessment of Adherence to Treatment Plan ASSESSMENTS - Wound and Skin A ssessment / Reassessment '[]'  - 0 Simple Wound Assessment / Reassessment - one wound X- 2 5 Complex Wound Assessment / Reassessment - multiple wounds '[]'  - 0 Dermatologic / Skin Assessment (not related to wound area) ASSESSMENTS - Focused Assessment '[]'  - 0 Circumferential Edema Measurements - multi extremities '[]'  - 0 Nutritional Assessment / Counseling / Intervention '[]'  - 0 Lower Extremity Assessment (monofilament, tuning fork, pulses) '[]'  - 0 Peripheral Arterial Disease Assessment (using hand held doppler) ASSESSMENTS - Ostomy and/or Continence Assessment and Care '[]'  - 0 Incontinence Assessment and Management '[]'  - 0 Ostomy Care Assessment and Management (repouching, etc.) PROCESS - Coordination of Care '[]'  - 0 Simple Patient / Family Education for ongoing care X- 1 20 Complex (extensive) Patient / Family Education for ongoing care X- 1 10 Staff obtains Programmer, systems, Records, T Results / Process Orders est '[]'  - 0 Staff telephones HHA, Nursing Homes / Clarify orders / etc '[]'  - 0 Routine Transfer to another Facility (non-emergent condition) '[]'  - 0 Routine Hospital Admission (non-emergent condition) '[]'  - 0 New Admissions / Biomedical engineer / Ordering NPWT Apligraf, etc. , '[]'  - 0 Emergency Hospital Admission (emergent condition) '[]'  - 0 Simple Discharge Coordination '[]'  - 0 Complex (extensive) Discharge Coordination PROCESS - Special Needs '[]'  - 0 Pediatric / Minor Patient Management '[]'  - 0 Isolation Patient Management '[]'  - 0 Hearing / Language / Visual special needs '[]'  - 0 Assessment of Community assistance (transportation, D/C planning,  etc.) '[]'  - 0 Additional assistance / Altered mentation '[]'  - 0 Support Surface(s) Assessment (bed, cushion, seat, etc.) INTERVENTIONS - Wound Cleansing /  Measurement '[]'  - 0 Simple Wound Cleansing - one wound X- 2 5 Complex Wound Cleansing - multiple wounds X- 1 5 Wound Imaging (photographs - any number of wounds) '[]'  - 0 Wound Tracing (instead of photographs) '[]'  - 0 Simple Wound Measurement - one wound X- 2 5 Complex Wound Measurement - multiple wounds INTERVENTIONS - Wound Dressings X - Small Wound Dressing one or multiple wounds 2 10 '[]'  - 0 Medium Wound Dressing one or multiple wounds '[]'  - 0 Large Wound Dressing one or multiple wounds '[]'  - 0 Application of Medications - topical '[]'  - 0 Application of Medications - injection INTERVENTIONS - Miscellaneous '[]'  - 0 External ear exam '[]'  - 0 Specimen Collection (cultures, biopsies, blood, body fluids, etc.) '[]'  - 0 Specimen(s) / Culture(s) sent or taken to Lab for analysis '[]'  - 0 Patient Transfer (multiple staff / Civil Service fast streamer / Similar devices) '[]'  - 0 Simple Staple / Suture removal (25 or less) '[]'  - 0 Complex Staple / Suture removal (26 or more) '[]'  - 0 Hypo / Hyperglycemic Management (close monitor of Blood Glucose) '[]'  - 0 Ankle / Brachial Index (ABI) - do not check if billed separately X- 1 5 Vital Signs Has the patient been seen at the hospital within the last three years: Yes Total Score: 105 Level Of Care: New/Established - Level 3 Electronic Signature(s) Signed: 10/19/2020 5:50:03 PM By: Lorrin Jackson Entered By: Lorrin Jackson on 10/19/2020 15:15:14 -------------------------------------------------------------------------------- Encounter Discharge Information Details Patient Name: Date of Service: [name] [name], [name] 10/19/2020 2:15 PM Medical Record Number: [id number] Patient Account Number: [id number] Date of Birth/Sex: Treating RN: 07-25-1926 (87 y.o. [name] Primary Care Cadin Luka: [name]  Other Clinician: Referring Fartun Paradiso: Treating Aniello Christopoulos/Extender: Eli Hose in Treatment: 4 Encounter Discharge Information Items Discharge Condition: Stable Ambulatory Status: Wheelchair Discharge Destination: Home Transportation: Private Auto Accompanied By: wife Schedule Follow-up Appointment: Yes Clinical Summary of Care: Provided on 10/19/2020 Form Type Recipient Paper Patient Patient Electronic Signature(s) Signed: 10/19/2020 4:34:43 PM By: Lorrin Jackson Entered By: Lorrin Jackson on 10/19/2020 16:34:43 -------------------------------------------------------------------------------- Lower Extremity Assessment Details Patient Name: Date of Service: [name] [name], [name] 10/19/2020 2:15 PM Medical Record Number: [id number] Patient Account Number: [id number] Date of Birth/Sex: Treating RN: 1926-05-11 (87 y.o. [name] Primary Care Sulaiman Imbert: [name] Other Clinician: Referring Dushawn Pusey: Treating Anabelen Kaminsky/Extender: [name] in Treatment: 4 Edema Assessment Assessed: [Left: No] [Right: Yes] Edema: [Left: Ye] [Right: s] Calf Left: Right: Point of Measurement: 34 cm From Medial Instep 40.8 cm Ankle Left: Right: Point of Measurement: 11 cm From Medial Instep 31 cm Vascular Assessment Pulses: Dorsalis Pedis Palpable: [Right:Yes] Electronic Signature(s) Signed: 10/19/2020 2:48:07 PM By: Lorrin Jackson Entered By: Lorrin Jackson on 10/19/2020 14:48:07 -------------------------------------------------------------------------------- Multi-Disciplinary Care Plan Details Patient Name: Date of Service: [name] [name], [name] 10/19/2020 2:15 PM Medical Record Number: [id number] Patient Account Number: [id number] Date of Birth/Sex: Treating RN: Jan 08, 1926 (87 y.o. [name] Primary Care Kylil Swopes: [name] Other Clinician: Referring Shion Bluestein: Treating Brenon Antosh/Extender: Eli Hose in Treatment: 4 Active Inactive Tissue Oxygenation Nursing Diagnoses: Potential alteration in peripheral tissue perfusion (select prior to confirmation of diagnosis) Goals: Non-invasive arterial studies are completed as ordered Date Initiated: 09/21/2020 Date Inactivated: 10/19/2020 Target Resolution Date: 09/21/2020 Goal Status: Met Revascularization procedures completed as ordered Date Initiated: 09/21/2020 Target Resolution Date: 11/23/2020 Goal Status: Active Interventions: Assess patient understanding  of disease process and management upon diagnosis and as needed Provide education on tissue oxygenation and ischemia Treatment Activities: Ankle Brachial Index (ABI) : 09/21/2020 Non-invasive vascular studies : 09/21/2020 T ordered outside of clinic : 09/21/2020 est Notes: 10/19/20: Had vascular testing, awaiting appointment to see vascular MD Wound/Skin Impairment Nursing Diagnoses: Knowledge deficit related to ulceration/compromised skin integrity Goals: Patient/caregiver will verbalize understanding of skin care regimen Date Initiated: 09/21/2020 Target Resolution Date: 11/30/2020 Goal Status: Active Interventions: Assess patient/caregiver ability to obtain necessary supplies Assess patient/caregiver ability to perform ulcer/skin care regimen upon admission and as needed Provide education on ulcer and skin care Treatment Activities: Skin care regimen initiated : 09/21/2020 Topical wound management initiated : 09/21/2020 Notes: Electronic Signature(s) Signed: 10/19/2020 2:49:10 PM By: Lorrin Jackson Entered By: Lorrin Jackson on 10/19/2020 14:49:10 -------------------------------------------------------------------------------- Pain Assessment Details Patient Name: Date of Service: [name] [name], [name] 10/19/2020 2:15 PM Medical Record Number: [id number] Patient Account Number: [id number] Date of Birth/Sex: Treating RN: 1926/06/19 (87 y.o. [name] Primary  Care Roshni Burbano: [name] Other Clinician: Referring Karon Heckendorn: Treating Kristan Brummitt/Extender: Eli Hose in Treatment: 4 Active Problems Location of Pain Severity and Description of Pain Patient Has Paino No Site Locations Pain Management and Medication Current Pain Management: Electronic Signature(s) Signed: 10/19/2020 5:50:03 PM By: Lorrin Jackson Entered By: Lorrin Jackson on 10/19/2020 14:34:54 -------------------------------------------------------------------------------- Patient/Caregiver Education Details Patient Name: Date of Service: [name] [name] ID I. 10/19/2022andnbsp2:15 PM Medical Record Number: [id number] Patient Account Number: [id number] Date of Birth/Gender: Treating RN: 1926/10/14 (87 y.o. [name] Primary Care Physician: [name] Other Clinician: Referring Physician: Treating Physician/Extender: Eli Hose in Treatment: 4 Education Assessment Education Provided To: Patient Education Topics Provided Wound/Skin Impairment: Methods: Demonstration, Explain/Verbal, Printed Responses: State content correctly Electronic Signature(s) Signed: 10/19/2020 5:50:03 PM By: Lorrin Jackson Entered By: Lorrin Jackson on 10/19/2020 14:24:01 -------------------------------------------------------------------------------- Wound Assessment Details Patient Name: Date of Service: [name] [name], [name] 10/19/2020 2:15 PM Medical Record Number: [id number] Patient Account Number: [id number] Date of Birth/Sex: Treating RN: 11-23-1926 (87 y.o. [name] Primary Care China Deitrick: [name] Other Clinician: Referring Rachael Ferrie: Treating Kindle Strohmeier/Extender: [name] in Treatment: 4 Wound Status Wound Number: 1 Primary Pressure Ulcer Etiology: Wound Location: Right, Lateral Calcaneus Wound Open Wounding Event: Pressure Injury Status: Date Acquired: 06/08/2020 Comorbid  Cataracts, Sleep Apnea, Arrhythmia, Congestive Heart Failure, Weeks Of Treatment: 4 History: Coronary Artery Disease, Hypertension, Myocardial Infarction, Clustered Wound: No End Stage Renal Disease, Gout, Dementia, Neuropathy Photos Wound Measurements Length: (cm) 1.4 Width: (cm) 1.6 Depth: (cm) 0.1 Area: (cm) 1.759 Volume: (cm) 0.176 % Reduction in Area: -255.4% % Reduction in Volume: -259.2% Epithelialization: Small (1-33%) Tunneling: No Undermining: No Wound Description Classification: Category/Stage III Wound Margin: Distinct, outline attached Exudate Amount: Medium Exudate Type: Serosanguineous Exudate Color: red, brown Foul Odor After Cleansing: No Slough/Fibrino Yes Wound Bed Granulation Amount: Large (67-100%) Exposed Structure Granulation Quality: Red Fascia Exposed: No Necrotic Amount: Small (1-33%) Fat Layer (Subcutaneous Tissue) Exposed: Yes Necrotic Quality: Adherent Slough Tendon Exposed: No Muscle Exposed: No Joint Exposed: No Bone Exposed: No Assessment Notes maceration noted Treatment Notes Wound #1 (Calcaneus) Wound Laterality: Right, Lateral Cleanser Normal Saline Discharge Instruction: Cleanse the wound with Normal Saline prior to applying a clean dressing using gauze sponges, not tissue or cotton balls. Soap and Water Discharge Instruction: May shower and wash wound with dial antibacterial soap and water prior  to dressing change. Peri-Wound Care Skin Prep Discharge Instruction: Use skin prep as directed Zinc Oxide Ointment 30g tube Discharge Instruction: **apply only around the wound as needed for moisture noted to good skin.**Apply Zinc Oxide to periwound with each dressing change Topical Primary Dressing KerraCel Ag Gelling Fiber Dressing, 2x2 in (silver alginate) Discharge Instruction: Apply silver alginate to wound bed as instructed Secondary Dressing Zetuvit Plus Silicone Border Dressing 4x4 (in/in) Discharge Instruction: Apply  silicone border over primary dressing as directed. Secured With Compression Wrap Compression Stockings Environmental education officer) Signed: 10/19/2020 5:50:03 PM By: Lorrin Jackson Entered By: Lorrin Jackson on 10/19/2020 14:42:34 -------------------------------------------------------------------------------- Wound Assessment Details Patient Name: Date of Service: [name] [name], [name] 10/19/2020 2:15 PM Medical Record Number: [id number] Patient Account Number: [id number] Date of Birth/Sex: Treating RN: 1926-05-09 (87 y.o. [name] Primary Care Hera Celaya: [name] Other Clinician: Referring Koreen Lizaola: Treating Khalessi Blough/Extender: [name] in Treatment: 4 Wound Status Wound Number: 2 Primary Pressure Ulcer Etiology: Wound Location: Right, Medial Calcaneus Wound Open Wounding Event: Pressure Injury Status: Date Acquired: 10/17/2020 Comorbid Cataracts, Sleep Apnea, Arrhythmia, Congestive Heart Failure, Weeks Of Treatment: 0 History: Coronary Artery Disease, Hypertension, Myocardial Infarction, Clustered Wound: No End Stage Renal Disease, Gout, Dementia, Neuropathy Photos Wound Measurements Length: (cm) 1.2 Width: (cm) 1 Depth: (cm) 0.1 Area: (cm) 0.942 Volume: (cm) 0.094 % Reduction in Area: 0% % Reduction in Volume: 0% Epithelialization: None Tunneling: No Undermining: No Wound Description Classification: Category/Stage III Wound Margin: Distinct, outline attached Exudate Amount: Medium Exudate Type: Serous Exudate Color: amber Foul Odor After Cleansing: No Slough/Fibrino No Wound Bed Granulation Amount: Large (67-100%) Exposed Structure Granulation Quality: Red, Pink Fascia Exposed: No Necrotic Amount: None Present (0%) Fat Layer (Subcutaneous Tissue) Exposed: Yes Tendon Exposed: No Muscle Exposed: No Joint Exposed: No Bone Exposed: No Assessment Notes maceration noted Treatment Notes Wound #2 (Calcaneus) Wound  Laterality: Right, Medial Cleanser Normal Saline Discharge Instruction: Cleanse the wound with Normal Saline prior to applying a clean dressing using gauze sponges, not tissue or cotton balls. Soap and Water Discharge Instruction: May shower and wash wound with dial antibacterial soap and water prior to dressing change. Peri-Wound Care Skin Prep Discharge Instruction: Use skin prep as directed Zinc Oxide Ointment 30g tube Discharge Instruction: **apply only around the wound as needed for moisture noted to good skin.**Apply Zinc Oxide to periwound with each dressing change Topical Primary Dressing KerraCel Ag Gelling Fiber Dressing, 2x2 in (silver alginate) Discharge Instruction: Apply silver alginate to wound bed as instructed Secondary Dressing Zetuvit Plus Silicone Border Dressing 4x4 (in/in) Discharge Instruction: Apply silicone border over primary dressing as directed. Secured With Compression Wrap Compression Stockings Environmental education officer) Signed: 10/19/2020 5:50:03 PM By: Lorrin Jackson Entered By: Lorrin Jackson on 10/19/2020 14:43:33 -------------------------------------------------------------------------------- Vitals Details Patient Name: Date of Service: [name] [name], [name] 10/19/2020 2:15 PM Medical Record Number: [id number] Patient Account Number: [id number] Date of Birth/Sex: Treating RN: 17-Mar-1926 (87 y.o. [name] Primary Care Ivanka Kirshner: [name] Other Clinician: Referring Bessye Stith: Treating Alven Alverio/Extender: [name] in Treatment: 4 Vital Signs Time Taken: 14:33 Temperature (F): 97.8 Height (in): 72 Pulse (bpm): 66 Weight (lbs): 223 Respiratory Rate (breaths/min): 20 Body Mass Index (BMI): 30.2 Blood Pressure (mmHg): 146/64 Reference Range: 80 - 120 mg / dl Electronic Signature(s) Signed: 10/19/2020 5:50:03 PM By: Lorrin Jackson Entered By: Lorrin Jackson on 10/19/2020 14:33:31

## 2020-10-21 DIAGNOSIS — M109 Gout, unspecified: Secondary | ICD-10-CM | POA: Diagnosis not present

## 2020-10-21 DIAGNOSIS — M0600 Rheumatoid arthritis without rheumatoid factor, unspecified site: Secondary | ICD-10-CM | POA: Diagnosis not present

## 2020-10-21 DIAGNOSIS — M353 Polymyalgia rheumatica: Secondary | ICD-10-CM | POA: Diagnosis not present

## 2020-10-21 DIAGNOSIS — M159 Polyosteoarthritis, unspecified: Secondary | ICD-10-CM | POA: Diagnosis not present

## 2020-10-22 ENCOUNTER — Emergency Department (HOSPITAL_COMMUNITY): Payer: Medicare Other

## 2020-10-22 ENCOUNTER — Other Ambulatory Visit: Payer: Self-pay

## 2020-10-22 ENCOUNTER — Inpatient Hospital Stay (HOSPITAL_COMMUNITY)
Admission: EM | Admit: 2020-10-22 | Discharge: 2020-10-27 | DRG: 981 | Disposition: A | Discharge status: Home Health | Payer: Medicare Other | Attending: Family Medicine | Admitting: Family Medicine

## 2020-10-22 ENCOUNTER — Inpatient Hospital Stay (HOSPITAL_COMMUNITY): Payer: Medicare Other

## 2020-10-22 ENCOUNTER — Encounter (HOSPITAL_COMMUNITY): Payer: Self-pay | Admitting: Emergency Medicine

## 2020-10-22 DIAGNOSIS — I872 Venous insufficiency (chronic) (peripheral): Secondary | ICD-10-CM | POA: Diagnosis present

## 2020-10-22 DIAGNOSIS — R7303 Prediabetes: Secondary | ICD-10-CM | POA: Diagnosis present

## 2020-10-22 DIAGNOSIS — R936 Abnormal findings on diagnostic imaging of limbs: Secondary | ICD-10-CM | POA: Diagnosis not present

## 2020-10-22 DIAGNOSIS — N182 Chronic kidney disease, stage 2 (mild): Secondary | ICD-10-CM | POA: Diagnosis present

## 2020-10-22 DIAGNOSIS — B9689 Other specified bacterial agents as the cause of diseases classified elsewhere: Secondary | ICD-10-CM | POA: Diagnosis present

## 2020-10-22 DIAGNOSIS — R531 Weakness: Secondary | ICD-10-CM | POA: Diagnosis present

## 2020-10-22 DIAGNOSIS — N401 Enlarged prostate with lower urinary tract symptoms: Secondary | ICD-10-CM | POA: Diagnosis present

## 2020-10-22 DIAGNOSIS — Z888 Allergy status to other drugs, medicaments and biological substances status: Secondary | ICD-10-CM

## 2020-10-22 DIAGNOSIS — I2510 Atherosclerotic heart disease of native coronary artery without angina pectoris: Secondary | ICD-10-CM | POA: Diagnosis present

## 2020-10-22 DIAGNOSIS — I70221 Atherosclerosis of native arteries of extremities with rest pain, right leg: Secondary | ICD-10-CM | POA: Diagnosis present

## 2020-10-22 DIAGNOSIS — I252 Old myocardial infarction: Secondary | ICD-10-CM

## 2020-10-22 DIAGNOSIS — N138 Other obstructive and reflux uropathy: Secondary | ICD-10-CM | POA: Diagnosis present

## 2020-10-22 DIAGNOSIS — Z7969 Long term (current) use of other immunomodulators and immunosuppressants: Secondary | ICD-10-CM

## 2020-10-22 DIAGNOSIS — L03116 Cellulitis of left lower limb: Secondary | ICD-10-CM

## 2020-10-22 DIAGNOSIS — Z961 Presence of intraocular lens: Secondary | ICD-10-CM | POA: Diagnosis present

## 2020-10-22 DIAGNOSIS — N179 Acute kidney failure, unspecified: Secondary | ICD-10-CM | POA: Diagnosis present

## 2020-10-22 DIAGNOSIS — I482 Chronic atrial fibrillation, unspecified: Secondary | ICD-10-CM | POA: Diagnosis present

## 2020-10-22 DIAGNOSIS — I130 Hypertensive heart and chronic kidney disease with heart failure and stage 1 through stage 4 chronic kidney disease, or unspecified chronic kidney disease: Secondary | ICD-10-CM | POA: Diagnosis present

## 2020-10-22 DIAGNOSIS — L97829 Non-pressure chronic ulcer of other part of left lower leg with unspecified severity: Secondary | ICD-10-CM | POA: Diagnosis present

## 2020-10-22 DIAGNOSIS — L97519 Non-pressure chronic ulcer of other part of right foot with unspecified severity: Secondary | ICD-10-CM | POA: Diagnosis not present

## 2020-10-22 DIAGNOSIS — Z91030 Bee allergy status: Secondary | ICD-10-CM

## 2020-10-22 DIAGNOSIS — Z880 Allergy status to penicillin: Secondary | ICD-10-CM

## 2020-10-22 DIAGNOSIS — G9340 Encephalopathy, unspecified: Secondary | ICD-10-CM | POA: Diagnosis not present

## 2020-10-22 DIAGNOSIS — N136 Pyonephrosis: Secondary | ICD-10-CM | POA: Diagnosis present

## 2020-10-22 DIAGNOSIS — I5033 Acute on chronic diastolic (congestive) heart failure: Secondary | ICD-10-CM | POA: Diagnosis present

## 2020-10-22 DIAGNOSIS — Z9862 Peripheral vascular angioplasty status: Secondary | ICD-10-CM | POA: Diagnosis not present

## 2020-10-22 DIAGNOSIS — Z9841 Cataract extraction status, right eye: Secondary | ICD-10-CM

## 2020-10-22 DIAGNOSIS — Z79899 Other long term (current) drug therapy: Secondary | ICD-10-CM

## 2020-10-22 DIAGNOSIS — Z951 Presence of aortocoronary bypass graft: Secondary | ICD-10-CM | POA: Diagnosis not present

## 2020-10-22 DIAGNOSIS — E785 Hyperlipidemia, unspecified: Secondary | ICD-10-CM | POA: Diagnosis present

## 2020-10-22 DIAGNOSIS — I70239 Atherosclerosis of native arteries of right leg with ulceration of unspecified site: Secondary | ICD-10-CM | POA: Diagnosis not present

## 2020-10-22 DIAGNOSIS — B965 Pseudomonas (aeruginosa) (mallei) (pseudomallei) as the cause of diseases classified elsewhere: Secondary | ICD-10-CM | POA: Diagnosis present

## 2020-10-22 DIAGNOSIS — D649 Anemia, unspecified: Secondary | ICD-10-CM | POA: Diagnosis present

## 2020-10-22 DIAGNOSIS — Z885 Allergy status to narcotic agent status: Secondary | ICD-10-CM

## 2020-10-22 DIAGNOSIS — R319 Hematuria, unspecified: Secondary | ICD-10-CM

## 2020-10-22 DIAGNOSIS — M353 Polymyalgia rheumatica: Secondary | ICD-10-CM | POA: Diagnosis present

## 2020-10-22 DIAGNOSIS — T83511A Infection and inflammatory reaction due to indwelling urethral catheter, initial encounter: Secondary | ICD-10-CM | POA: Diagnosis present

## 2020-10-22 DIAGNOSIS — Z66 Do not resuscitate: Secondary | ICD-10-CM | POA: Diagnosis present

## 2020-10-22 DIAGNOSIS — Z7901 Long term (current) use of anticoagulants: Secondary | ICD-10-CM

## 2020-10-22 DIAGNOSIS — Z952 Presence of prosthetic heart valve: Secondary | ICD-10-CM

## 2020-10-22 DIAGNOSIS — M109 Gout, unspecified: Secondary | ICD-10-CM | POA: Diagnosis present

## 2020-10-22 DIAGNOSIS — Y846 Urinary catheterization as the cause of abnormal reaction of the patient, or of later complication, without mention of misadventure at the time of the procedure: Secondary | ICD-10-CM | POA: Diagnosis present

## 2020-10-22 DIAGNOSIS — L97419 Non-pressure chronic ulcer of right heel and midfoot with unspecified severity: Secondary | ICD-10-CM | POA: Diagnosis present

## 2020-10-22 DIAGNOSIS — Z23 Encounter for immunization: Secondary | ICD-10-CM

## 2020-10-22 DIAGNOSIS — S81801A Unspecified open wound, right lower leg, initial encounter: Secondary | ICD-10-CM | POA: Diagnosis not present

## 2020-10-22 DIAGNOSIS — Z7952 Long term (current) use of systemic steroids: Secondary | ICD-10-CM

## 2020-10-22 DIAGNOSIS — M1990 Unspecified osteoarthritis, unspecified site: Secondary | ICD-10-CM | POA: Diagnosis present

## 2020-10-22 DIAGNOSIS — L8990 Pressure ulcer of unspecified site, unspecified stage: Secondary | ICD-10-CM | POA: Insufficient documentation

## 2020-10-22 DIAGNOSIS — Z87442 Personal history of urinary calculi: Secondary | ICD-10-CM

## 2020-10-22 DIAGNOSIS — Z20822 Contact with and (suspected) exposure to covid-19: Secondary | ICD-10-CM | POA: Diagnosis present

## 2020-10-22 DIAGNOSIS — Z881 Allergy status to other antibiotic agents status: Secondary | ICD-10-CM

## 2020-10-22 DIAGNOSIS — M069 Rheumatoid arthritis, unspecified: Secondary | ICD-10-CM | POA: Diagnosis present

## 2020-10-22 DIAGNOSIS — H409 Unspecified glaucoma: Secondary | ICD-10-CM | POA: Diagnosis present

## 2020-10-22 DIAGNOSIS — N3001 Acute cystitis with hematuria: Secondary | ICD-10-CM | POA: Diagnosis not present

## 2020-10-22 DIAGNOSIS — N390 Urinary tract infection, site not specified: Secondary | ICD-10-CM | POA: Diagnosis not present

## 2020-10-22 DIAGNOSIS — G9341 Metabolic encephalopathy: Secondary | ICD-10-CM | POA: Diagnosis present

## 2020-10-22 DIAGNOSIS — I70291 Other atherosclerosis of native arteries of extremities, right leg: Secondary | ICD-10-CM | POA: Diagnosis not present

## 2020-10-22 DIAGNOSIS — I7143 Infrarenal abdominal aortic aneurysm, without rupture: Secondary | ICD-10-CM | POA: Diagnosis not present

## 2020-10-22 DIAGNOSIS — I714 Abdominal aortic aneurysm, without rupture, unspecified: Secondary | ICD-10-CM | POA: Diagnosis present

## 2020-10-22 DIAGNOSIS — Z9842 Cataract extraction status, left eye: Secondary | ICD-10-CM

## 2020-10-22 DIAGNOSIS — Z9181 History of falling: Secondary | ICD-10-CM

## 2020-10-22 DIAGNOSIS — Z87891 Personal history of nicotine dependence: Secondary | ICD-10-CM

## 2020-10-22 DIAGNOSIS — L97411 Non-pressure chronic ulcer of right heel and midfoot limited to breakdown of skin: Secondary | ICD-10-CM | POA: Diagnosis not present

## 2020-10-22 DIAGNOSIS — G4730 Sleep apnea, unspecified: Secondary | ICD-10-CM | POA: Diagnosis present

## 2020-10-22 DIAGNOSIS — I480 Paroxysmal atrial fibrillation: Secondary | ICD-10-CM | POA: Diagnosis present

## 2020-10-22 DIAGNOSIS — I70234 Atherosclerosis of native arteries of right leg with ulceration of heel and midfoot: Secondary | ICD-10-CM | POA: Diagnosis not present

## 2020-10-22 DIAGNOSIS — L97919 Non-pressure chronic ulcer of unspecified part of right lower leg with unspecified severity: Secondary | ICD-10-CM | POA: Diagnosis not present

## 2020-10-22 DIAGNOSIS — K219 Gastro-esophageal reflux disease without esophagitis: Secondary | ICD-10-CM | POA: Diagnosis present

## 2020-10-22 LAB — COMPREHENSIVE METABOLIC PANEL
ALT: 37 U/L (ref 0–44)
AST: 33 U/L (ref 15–41)
Albumin: 2.9 g/dL — ABNORMAL LOW (ref 3.5–5.0)
Alkaline Phosphatase: 90 U/L (ref 38–126)
Anion gap: 9 (ref 5–15)
BUN: 29 mg/dL — ABNORMAL HIGH (ref 8–23)
CO2: 24 mmol/L (ref 22–32)
Calcium: 8.6 mg/dL — ABNORMAL LOW (ref 8.9–10.3)
Chloride: 109 mmol/L (ref 98–111)
Creatinine, Ser: 1.92 mg/dL — ABNORMAL HIGH (ref 0.61–1.24)
GFR, Estimated: 32 mL/min — ABNORMAL LOW (ref >60)
Glucose, Bld: 131 mg/dL — ABNORMAL HIGH (ref 70–99)
Potassium: 3.5 mmol/L (ref 3.5–5.1)
Sodium: 142 mmol/L (ref 135–145)
Total Bilirubin: 0.6 mg/dL (ref 0.3–1.2)
Total Protein: 5.8 g/dL — ABNORMAL LOW (ref 6.5–8.1)

## 2020-10-22 LAB — IRON AND TIBC
Iron: 13 ug/dL — ABNORMAL LOW (ref 45–182)
Saturation Ratios: 4 % — ABNORMAL LOW (ref 17.9–39.5)
TIBC: 325 ug/dL (ref 250–450)
UIBC: 312 ug/dL

## 2020-10-22 LAB — CBC WITH DIFFERENTIAL/PLATELET
Abs Immature Granulocytes: 0.05 10*3/uL (ref 0.00–0.07)
Basophils Absolute: 0 10*3/uL (ref 0.0–0.1)
Basophils Relative: 0 %
Eosinophils Absolute: 0 10*3/uL (ref 0.0–0.5)
Eosinophils Relative: 0 %
HCT: 27 % — ABNORMAL LOW (ref 39.0–52.0)
Hemoglobin: 8.4 g/dL — ABNORMAL LOW (ref 13.0–17.0)
Immature Granulocytes: 1 %
Lymphocytes Relative: 4 %
Lymphs Abs: 0.4 10*3/uL — ABNORMAL LOW (ref 0.7–4.0)
MCH: 30.8 pg (ref 26.0–34.0)
MCHC: 31.1 g/dL (ref 30.0–36.0)
MCV: 98.9 fL (ref 80.0–100.0)
Monocytes Absolute: 0.6 10*3/uL (ref 0.1–1.0)
Monocytes Relative: 7 %
Neutro Abs: 7.8 10*3/uL — ABNORMAL HIGH (ref 1.7–7.7)
Neutrophils Relative %: 88 %
Platelets: 123 10*3/uL — ABNORMAL LOW (ref 150–400)
RBC: 2.73 MIL/uL — ABNORMAL LOW (ref 4.22–5.81)
RDW: 15.8 % — ABNORMAL HIGH (ref 11.5–15.5)
WBC: 8.9 10*3/uL (ref 4.0–10.5)
nRBC: 0 % (ref 0.0–0.2)

## 2020-10-22 LAB — PROTIME-INR
INR: 2.1 — ABNORMAL HIGH (ref 0.8–1.2)
Prothrombin Time: 23.7 seconds — ABNORMAL HIGH (ref 11.4–15.2)

## 2020-10-22 LAB — RESP PANEL BY RT-PCR (FLU A&B, COVID) ARPGX2
Influenza A by PCR: NEGATIVE
Influenza B by PCR: NEGATIVE
SARS Coronavirus 2 by RT PCR: NEGATIVE

## 2020-10-22 LAB — URINALYSIS, ROUTINE W REFLEX MICROSCOPIC
Bilirubin Urine: NEGATIVE
Glucose, UA: NEGATIVE mg/dL
Ketones, ur: NEGATIVE mg/dL
Nitrite: POSITIVE — AB
Protein, ur: 100 mg/dL — AB
RBC / HPF: >50 RBC/hpf — ABNORMAL HIGH (ref 0–5)
Specific Gravity, Urine: 1.014 (ref 1.005–1.030)
WBC, UA: >50 WBC/hpf — ABNORMAL HIGH (ref 0–5)
pH: 9 — ABNORMAL HIGH (ref 5.0–8.0)

## 2020-10-22 LAB — RETICULOCYTES
Immature Retic Fract: 18.9 % — ABNORMAL HIGH (ref 2.3–15.9)
RBC.: 2.97 MIL/uL — ABNORMAL LOW (ref 4.22–5.81)
Retic Count, Absolute: 44.3 10*3/uL (ref 19.0–186.0)
Retic Ct Pct: 1.5 % (ref 0.4–3.1)

## 2020-10-22 LAB — BLOOD GAS, VENOUS
Acid-base deficit: 0.9 mmol/L (ref 0.0–2.0)
Bicarbonate: 23.7 mmol/L (ref 20.0–28.0)
Drawn by: 164
FIO2: 28
O2 Saturation: 63.6 %
Patient temperature: 37
pCO2, Ven: 41.9 mmHg — ABNORMAL LOW (ref 44.0–60.0)
pH, Ven: 7.37 (ref 7.250–7.430)
pO2, Ven: 39.3 mmHg (ref 32.0–45.0)

## 2020-10-22 LAB — APTT: aPTT: 33 seconds (ref 24–36)

## 2020-10-22 LAB — URIC ACID: Uric Acid, Serum: 5.9 mg/dL (ref 3.7–8.6)

## 2020-10-22 LAB — CK: Total CK: 52 U/L (ref 49–397)

## 2020-10-22 LAB — FERRITIN: Ferritin: 92 ng/mL (ref 24–336)

## 2020-10-22 LAB — LACTIC ACID, PLASMA
Lactic Acid, Venous: 1.9 mmol/L (ref 0.5–1.9)
Lactic Acid, Venous: 1.3 mmol/L (ref 0.5–1.9)

## 2020-10-22 MED ORDER — AMIODARONE HCL 200 MG PO TABS
200.0000 mg | ORAL_TABLET | Freq: Every day | ORAL | Status: DC
  Administered 2020-10-22 – 2020-10-27 (×6): 200 mg via ORAL
  Filled 2020-10-22 (×6): qty 1

## 2020-10-22 MED ORDER — SODIUM CHLORIDE 0.9 % IV SOLN
2.0000 g | INTRAVENOUS | Status: DC
  Administered 2020-10-23 – 2020-10-25 (×3): 2 g via INTRAVENOUS
  Filled 2020-10-22 (×3): qty 20

## 2020-10-22 MED ORDER — SODIUM CHLORIDE 0.9 % IV SOLN
2.0000 g | Freq: Once | INTRAVENOUS | Status: AC
  Administered 2020-10-22: 2 g via INTRAVENOUS
  Filled 2020-10-22: qty 2

## 2020-10-22 MED ORDER — CHLORHEXIDINE GLUCONATE CLOTH 2 % EX PADS
6.0000 | MEDICATED_PAD | Freq: Every day | CUTANEOUS | Status: DC
  Administered 2020-10-23 – 2020-10-27 (×5): 6 via TOPICAL

## 2020-10-22 MED ORDER — ACETAMINOPHEN 650 MG RE SUPP: 650.0000 mg | Freq: Four times a day (QID) | RECTAL | Status: DC | PRN

## 2020-10-22 MED ORDER — RIVAROXABAN 15 MG PO TABS
15.0000 mg | ORAL_TABLET | Freq: Every day | ORAL | Status: DC
  Administered 2020-10-22 – 2020-10-23 (×2): 15 mg via ORAL
  Filled 2020-10-22 (×2): qty 1

## 2020-10-22 MED ORDER — PSYLLIUM HUSK POWD: 1.0000 | Freq: Every day | Status: DC

## 2020-10-22 MED ORDER — PANTOPRAZOLE SODIUM 40 MG PO TBEC
40.0000 mg | DELAYED_RELEASE_TABLET | ORAL | Status: DC
  Administered 2020-10-22 – 2020-10-27 (×3): 40 mg via ORAL
  Filled 2020-10-22 (×5): qty 1

## 2020-10-22 MED ORDER — ONDANSETRON HCL 4 MG PO TABS: 4.0000 mg | ORAL_TABLET | Freq: Four times a day (QID) | ORAL | Status: DC | PRN

## 2020-10-22 MED ORDER — FUROSEMIDE 10 MG/ML IJ SOLN
40.0000 mg | Freq: Every day | INTRAMUSCULAR | Status: DC
  Administered 2020-10-22: 40 mg via INTRAVENOUS
  Filled 2020-10-22 (×2): qty 4

## 2020-10-22 MED ORDER — LACTATED RINGERS IV BOLUS (SEPSIS)
500.0000 mL | Freq: Once | INTRAVENOUS | Status: AC
  Administered 2020-10-22: 500 mL via INTRAVENOUS

## 2020-10-22 MED ORDER — PREDNISONE 5 MG PO TABS
16.0000 mg | ORAL_TABLET | Freq: Every day | ORAL | Status: DC
  Administered 2020-10-23 – 2020-10-27 (×4): 16 mg via ORAL
  Filled 2020-10-22 (×6): qty 1

## 2020-10-22 MED ORDER — CYCLOBENZAPRINE HCL 10 MG PO TABS: 10.0000 mg | ORAL_TABLET | ORAL | Status: DC | PRN

## 2020-10-22 MED ORDER — METRONIDAZOLE 500 MG/100ML IV SOLN
500.0000 mg | Freq: Once | INTRAVENOUS | Status: AC
  Administered 2020-10-22: 500 mg via INTRAVENOUS
  Filled 2020-10-22: qty 100

## 2020-10-22 MED ORDER — ONDANSETRON HCL 4 MG/2ML IJ SOLN: 4.0000 mg | Freq: Four times a day (QID) | INTRAMUSCULAR | Status: DC | PRN

## 2020-10-22 MED ORDER — LACTATED RINGERS IV SOLN: INTRAVENOUS | Status: DC

## 2020-10-22 MED ORDER — METOPROLOL SUCCINATE ER 25 MG PO TB24: 25.0000 mg | ORAL_TABLET | Freq: Every day | ORAL | Status: DC | PRN

## 2020-10-22 MED ORDER — FINASTERIDE 5 MG PO TABS
5.0000 mg | ORAL_TABLET | Freq: Every day | ORAL | Status: DC
  Administered 2020-10-22 – 2020-10-27 (×6): 5 mg via ORAL
  Filled 2020-10-22 (×6): qty 1

## 2020-10-22 MED ORDER — EZETIMIBE 10 MG PO TABS
10.0000 mg | ORAL_TABLET | Freq: Every day | ORAL | Status: DC
  Administered 2020-10-22 – 2020-10-27 (×6): 10 mg via ORAL
  Filled 2020-10-22 (×6): qty 1

## 2020-10-22 MED ORDER — GALANTAMINE HYDROBROMIDE ER 8 MG PO CP24
8.0000 mg | ORAL_CAPSULE | Freq: Every day | ORAL | Status: DC
  Administered 2020-10-23 – 2020-10-27 (×4): 8 mg via ORAL
  Filled 2020-10-22 (×6): qty 1

## 2020-10-22 MED ORDER — LEFLUNOMIDE 20 MG PO TABS
10.0000 mg | ORAL_TABLET | Freq: Every day | ORAL | Status: DC
  Administered 2020-10-22 – 2020-10-27 (×5): 10 mg via ORAL
  Filled 2020-10-22 (×6): qty 0.5

## 2020-10-22 MED ORDER — PSYLLIUM 95 % PO PACK
1.0000 | PACK | Freq: Every day | ORAL | Status: DC
  Administered 2020-10-22 – 2020-10-27 (×5): 1 via ORAL
  Filled 2020-10-22 (×6): qty 1

## 2020-10-22 MED ORDER — SODIUM CHLORIDE 0.9 % IV SOLN: 2.0000 g | INTRAVENOUS | Status: DC

## 2020-10-22 MED ORDER — FLUTICASONE PROPIONATE 50 MCG/ACT NA SUSP: 1.0000 | Freq: Every day | NASAL | Status: DC | PRN

## 2020-10-22 MED ORDER — ACETAMINOPHEN 325 MG PO TABS
650.0000 mg | ORAL_TABLET | Freq: Four times a day (QID) | ORAL | Status: DC | PRN
  Administered 2020-10-23 – 2020-10-24 (×2): 650 mg via ORAL
  Filled 2020-10-22 (×2): qty 2

## 2020-10-22 MED ORDER — VANCOMYCIN VARIABLE DOSE PER UNSTABLE RENAL FUNCTION (PHARMACIST DOSING): Status: DC

## 2020-10-22 MED ORDER — GABAPENTIN 100 MG PO CAPS
100.0000 mg | ORAL_CAPSULE | Freq: Three times a day (TID) | ORAL | Status: DC
  Administered 2020-10-22 – 2020-10-27 (×15): 100 mg via ORAL
  Filled 2020-10-22 (×15): qty 1

## 2020-10-22 MED ORDER — ISOSORBIDE MONONITRATE ER 60 MG PO TB24
60.0000 mg | ORAL_TABLET | Freq: Every day | ORAL | Status: DC
  Administered 2020-10-22 – 2020-10-27 (×5): 60 mg via ORAL
  Filled 2020-10-22 (×2): qty 1
  Filled 2020-10-22: qty 2
  Filled 2020-10-22 (×2): qty 1

## 2020-10-22 MED ORDER — HYDRALAZINE HCL 25 MG PO TABS
25.0000 mg | ORAL_TABLET | Freq: Two times a day (BID) | ORAL | Status: DC
  Administered 2020-10-23 – 2020-10-27 (×8): 25 mg via ORAL
  Filled 2020-10-22 (×9): qty 1

## 2020-10-22 MED ORDER — MEMANTINE HCL 10 MG PO TABS
10.0000 mg | ORAL_TABLET | Freq: Two times a day (BID) | ORAL | Status: DC
  Administered 2020-10-22 – 2020-10-27 (×10): 10 mg via ORAL
  Filled 2020-10-22 (×10): qty 1

## 2020-10-22 MED ORDER — VANCOMYCIN HCL 2000 MG/400ML IV SOLN
2000.0000 mg | Freq: Once | INTRAVENOUS | Status: AC
  Administered 2020-10-22: 2000 mg via INTRAVENOUS
  Filled 2020-10-22: qty 400

## 2020-10-22 NOTE — ED Triage Notes (Signed)
Pt bib [facility] from home where he lives with his wife. Pt has complaints of feeling more lethargic and having a fever of 101.3 this morning. Pt also endorses bloody urine in his catheter bag last night. Upon arrival, EMS found pt oxygen to be 86% on room air, placed on 3L [place]. Pt denies pain. AOx4.  EMS vitals: 122/62, 60HR, 96% 3L [place], 168CBG

## 2020-10-22 NOTE — H&P (Addendum)
History and Physical    [name] [name] XLK:[id number] DOB: [birth year] DOA: 10/22/2020  PCP: [name], MD (Confirm with patient/family/NH records and if not entered, this has to be entered at [facility] point of entry) Patient coming from: Home  I have personally briefly reviewed patient's old medical records in [place]  Chief Complaint: Feeling weak  HPI: [name] [name] is a 87 y.o. male with medical history significant of BPH with chronic urethral obstruction on indwelling Foley catheter, AS status post TAVR, CAD status post CABG, HTN, CKD stage II, chronic diastolic CHF, HLD, morbid obesity, came with malaise, generalized weakness for 1 day.  Family medicine patient has been feeling lethargic and sleepy since yesterday, spiked fever in the evening 101.3, and wife gave some Tylenol and fever broke.  Family also noticed the patient started to have blood in the urine since last night.  Patient complaining about feeling tired and low energy, denied any urinary symptoms, no diarrhea, no cough no chills.  Patient has had indwelling Foley catheter since August last year due to severe BPH and ureteral obstruction.  Failed initial voiding trial last month, and he was scheduled to have second void trial next week.  Last week patient sustained a fall, mechanical, on left leg, sustained a skin tear and shallow ulcer on left shin area.  Family has been doing local wound care.  And since this summer August/September, patient has had 2 right heel ulcers, for which he has been doing wound care at wound care center, and was told recently that both wound started to decrease in size.  His cardiologist recently/2 wks ago, decreased his torsemide from 20 mg daily to 10 mg twice daily due to " kidney function elevation"  ED Course: T 99.3, checks her x-ray shows bilateral atelectasis, WBC 8.9, creatinine 1.9 compared to baseline 1.4, K3.9.  Patient was given vancomycin and cefepime in the ED.  Review of  Systems: As per HPI otherwise 14 point review of systems negative.    Past Medical History:  Diagnosis Date   AAA (abdominal aortic aneurysm)    Acute coronary syndrome (HCC) 3/66/4403   Acute systolic heart failure ([place]) 03/03/2012   Angina    Arthritis    Ascending aorta dilatation ([place]) 03/13/2012   Fusiform dilatation of the ascending thoracic aorta discovered during surgery, max diameter 4.2-4.3 cm   Atrial fibrillation (HCC) 02/29/2012   Recurrent paroxysmal, new-onset   BPH (benign prostatic hypertrophy)    CAD (coronary artery disease)    Cath April 2012 40% left main, 50% LAD, 40% OM, occluded RCA with L-R collaterals    CHF (congestive heart failure) (HCC)    Chronic venous insufficiency    Dyspnea    GERD (gastroesophageal reflux disease)    Glaucoma    Gout    Headache    MIGRAINES in the past   History of kidney stones    Hyperlipidemia    Hypertension    MI (myocardial infarction) ([place])    Obesity (BMI 30-39.9)    Paroxysmal atrial fibrillation (HCC) 02/29/2012   Recurrent paroxysmal, new-onset    PMR (polymyalgia rheumatica) (HCC)    Pre-diabetes    S/P CABG x 3 03/06/2012   LIMA to LAD, SVG to OM, SVG to RCA, EVH via right thigh   S/P Maze operation for atrial fibrillation 03/06/2012   Complete bilateral lesions set using bipolar radiofrequency and cryothermy ablation with clipping of LA appendage   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  s/p TAVR with a 26 mm Edwards via the left subclavian by Drs Buena Irish and Bartle   Severe aortic stenosis 09/29/2019   Sleep apnea    USES CPAP NIGHTLY    Past Surgical History:  Procedure Laterality Date   CARDIAC CATHETERIZATION     CATARACT EXTRACTION, BILATERAL     with lens implants   CHOLECYSTECTOMY N/A 03/24/2012   Procedure: LAPAROSCOPIC CHOLECYSTECTOMY WITH INTRAOPERATIVE CHOLANGIOGRAM;  Surgeon: Zenovia Jarred, MD;  Location: [place];  Service: General;  Laterality: N/A;   CORONARY ARTERY  BYPASS GRAFT N/A 03/06/2012   Procedure: CORONARY ARTERY BYPASS GRAFTING (CABG);  Surgeon: Rexene Alberts, MD;  Location: [place];  Service: Open Heart Surgery;  Laterality: N/A;   ENDOVEIN HARVEST OF GREATER SAPHENOUS VEIN Right 03/06/2012   Procedure: ENDOVEIN HARVEST OF GREATER SAPHENOUS VEIN;  Surgeon: Rexene Alberts, MD;  Location: [place];  Service: Open Heart Surgery;  Laterality: Right;   INTRAOPERATIVE TRANSESOPHAGEAL ECHOCARDIOGRAM N/A 03/06/2012   Procedure: INTRAOPERATIVE TRANSESOPHAGEAL ECHOCARDIOGRAM;  Surgeon: Rexene Alberts, MD;  Location: [place];  Service: Open Heart Surgery;  Laterality: N/A;   IR KYPHO LUMBAR INC FX REDUCE BONE BX UNI/BIL CANNULATION INC/IMAGING  02/23/2020   LEFT HEART CATH Right 03/02/2012   Procedure: LEFT HEART CATH;  Surgeon: Sherren Mocha, MD;  Location: [facility] CATH LAB;  Service: Cardiovascular;  Laterality: Right;   MAZE N/A 03/06/2012   Procedure: MAZE;  Surgeon: Rexene Alberts, MD;  Location: [place];  Service: Open Heart Surgery;  Laterality: N/A;   PROSTATECTOMY     partial   RIGHT/LEFT HEART CATH AND CORONARY/GRAFT ANGIOGRAPHY N/A 09/29/2019   Procedure: RIGHT/LEFT HEART CATH AND CORONARY/GRAFT ANGIOGRAPHY;  Surgeon: Adrian Prows, MD;  Location: [place] CV LAB;  Service: Cardiovascular;  Laterality: N/A;   TEE WITHOUT CARDIOVERSION N/A 11/10/2019   Procedure: TRANSESOPHAGEAL ECHOCARDIOGRAM (TEE);  Surgeon: Burnell Blanks, MD;  Location: [place];  Service: Open Heart Surgery;  Laterality: N/A;   TRANSCATHETER AORTIC VALVE REPLACEMENT, TRANSFEMORAL  11/10/2019     reports that he quit smoking about 57 years ago. His smoking use included cigarettes. He has a 7.50 pack-year smoking history. He has never used smokeless tobacco. He reports current alcohol use of about 2.0 standard drinks per week. He reports that he does not use drugs.  Allergies  Allergen Reactions   Amoxicillin Other (See Comments)    Headache, debilitated   Bee Venom Anaphylaxis   Avelox  [Moxifloxacin]     Other reaction(s): Unknown   Duloxetine Hcl     Other reaction(s): felt weird   Levitra [Vardenafil]     Other reaction(s): Unknown   Morphine And Related Other (See Comments)    hypotension   Testosterone Other (See Comments)    unknown   Viagra [Sildenafil]     Other reaction(s): didn't like it    Family History  Problem Relation Age of Onset   Dementia Mother    Stroke Brother      Prior to Admission medications   Medication Sig Start Date End Date Taking? Authorizing Provider  acetaminophen (TYLENOL) 500 MG tablet Take 2 tablets (1,000 mg total) by mouth 3 (three) times daily. For 3 to 5 days and then change to 1000 mg 3 times daily as needed for pain. Patient taking differently: Take 500 mg by mouth as needed for mild pain. 08/23/20  Yes Hongalgi, Lenis Dickinson, MD  amiodarone (PACERONE) 200 MG tablet Take 1 tablet (200 mg total) by mouth daily.  03/09/20  Yes Cantwell, Celeste C, PA-C  amLODipine (NORVASC) 10 MG tablet Take 1 tablet (10 mg total) by mouth daily. 07/28/20  Yes Cantwell, Celeste C, PA-C  cholecalciferol (VITAMIN D3) 25 MCG (1000 UNIT) tablet Take 1,000 Units by mouth daily.    Yes [provider]  cyclobenzaprine (FLEXERIL) 10 MG tablet Take 10 mg by mouth as needed for muscle spasms.   Yes [provider]  EPINEPHrine 0.3 mg/0.3 mL IJ SOAJ injection Inject 0.3 mLs (0.3 mg total) into the muscle as needed for anaphylaxis. Patient taking differently: Inject 0.3 mg into the muscle once as needed for anaphylaxis. 08/08/18  Yes Ambs, Kathrine Cords, FNP  ezetimibe (ZETIA) 10 MG tablet Take 10 mg by mouth daily.   Yes [provider]  finasteride (PROSCAR) 5 MG tablet Take 5 mg by mouth daily. 09/15/20  Yes [provider]  fish oil-omega-3 fatty acids 1000 MG capsule Take 1 g by mouth daily.    Yes [provider]  fluticasone (FLONASE) 50 MCG/ACT nasal spray Place 1 spray into both nostrils daily as needed for allergies.    Yes [provider]  gabapentin (NEURONTIN) 100 MG capsule Take 100 mg by mouth 3 (three) times daily.   Yes [provider]  galantamine (RAZADYNE ER) 8 MG 24 hr capsule Take 8 mg by mouth Daily. 02/26/11  Yes [provider]  HONEY BEE VENOM IJ Inject 2 each as directed See admin instructions. Every eight weeks   Yes [provider]  hydrALAZINE (APRESOLINE) 25 MG tablet Take 25 mg by mouth 2 (two) times daily. 08/09/20  Yes [provider]  isosorbide mononitrate (IMDUR) 60 MG 24 hr tablet TAKE 1 TABLET(60 MG) BY MOUTH DAILY AFTER SUPPER Patient taking differently: Take 60 mg by mouth daily. 10/10/20  Yes Adrian Prows, MD  leflunomide (ARAVA) 10 MG tablet Take 10 mg by mouth daily. 08/24/20  Yes [provider]  magnesium gluconate (MAGONATE) 500 MG tablet Take 500 mg by mouth daily.   Yes [provider]  memantine (NAMENDA) 10 MG tablet Take 10 mg by mouth 2 (two) times daily. 09/08/19  Yes [provider]  metoprolol succinate (TOPROL-XL) 25 MG 24 hr tablet Take 1 tablet (25 mg total) by mouth daily as needed (for high heart rate, consistently greater than 110/min.). 08/23/20  Yes Hongalgi, Lenis Dickinson, MD  nitroGLYCERIN (NITROSTAT) 0.4 MG SL tablet Place 1 tablet (0.4 mg total) under the tongue every 5 (five) minutes x 3 doses as needed for chest pain. 12/16/19  Yes Cantwell, Celeste C, PA-C  pantoprazole (PROTONIX) 40 MG tablet Take 40 mg by mouth every other day.  10/28/14  Yes [provider]  potassium chloride (MICRO-K) 10 MEQ CR capsule TAKE ONE CAPSULE BY MOUTH DAILY WITH LASIX Patient taking differently: Take 10 mEq by mouth daily. 10/03/20  Yes Cantwell, Celeste C, PA-C  predniSONE (DELTASONE) 1 MG tablet Take 8 mg by mouth daily with breakfast.   Yes [provider]  Medicine Bow 1 each into the lungs at bedtime. Cpap   Yes [provider]  Psyllium Husk POWD Take 1 Scoop by mouth  daily.    Yes [provider]  Rivaroxaban (XARELTO) 15 MG TABS tablet Take 15 mg by mouth daily with supper.   Yes [provider]  torsemide (DEMADEX) 20 MG tablet Take 2 tablets (40 mg total) by mouth daily. 09/16/20  Yes Cantwell, Celeste C, PA-C  triamcinolone cream (KENALOG) 0.1 %  Apply 1 application topically daily as needed (skin irritation).   Yes [provider]  vitamin B-12 (CYANOCOBALAMIN) 1000 MCG tablet Take 1,000 mcg by mouth daily.    Yes [provider]  docusate sodium (COLACE) 100 MG capsule Take 1 capsule (100 mg total) by mouth 2 (two) times daily. Patient not taking: No sig reported 08/23/20   Modena Jansky, MD  oxyCODONE (OXY IR/ROXICODONE) 5 MG immediate release tablet Take 0.5 tablets (2.5 mg total) by mouth every 12 (twelve) hours as needed for breakthrough pain. Patient not taking: No sig reported 08/23/20   Modena Jansky, MD  polyethylene glycol (MIRALAX / GLYCOLAX) 17 g packet Take 17 g by mouth 2 (two) times daily. Patient not taking: No sig reported 08/23/20   Modena Jansky, MD  tamsulosin (FLOMAX) 0.4 MG CAPS capsule Take 1 capsule (0.4 mg total) by mouth daily after supper. Patient not taking: No sig reported 08/23/20   Modena Jansky, MD    Physical Exam: Vitals:   10/22/20 1300 10/22/20 1315 10/22/20 1445 10/22/20 1515  BP: (!) 117/53 111/60 126/84 (!) 143/66  Pulse: 72 (!) 116 83 80  Resp: 16 16  17   Temp:      TempSrc:      SpO2: 91% 90% 93% 95%  Weight:      Height:        Constitutional: NAD, calm, comfortable Vitals:   10/22/20 1300 10/22/20 1315 10/22/20 1445 10/22/20 1515  BP: (!) 117/53 111/60 126/84 (!) 143/66  Pulse: 72 (!) 116 83 80  Resp: 16 16  17   Temp:      TempSrc:      SpO2: 91% 90% 93% 95%  Weight:      Height:       Eyes: PERRL, lids and conjunctivae normal ENMT: Mucous membranes are moist. Posterior pharynx clear of any exudate or lesions.Normal dentition.  Neck: normal,  supple, no masses, no thyromegaly Respiratory: clear to auscultation bilaterally, no wheezing, no crackles. Normal respiratory effort. No accessory muscle use.  Cardiovascular: Regular rate and rhythm, no murmurs / rubs / gallops. 2+ extremity edema. 2+ pedal pulses. No carotid bruits.  Abdomen: no tenderness, no masses palpated. No hepatosplenomegaly. Bowel sounds positive.  Musculoskeletal: no clubbing / cyanosis. No joint deformity upper and lower extremities. Good ROM, no contractures. Normal muscle tone.  Skin: Left mid shin a shallow ulcer about 2X4 cm, base looks clean no foul smell. Two ulcers on the righ heel area, base clean, no discharges. Neurologic: CN 2-12 grossly intact. Sensation intact, DTR normal. Strength 5/5 in all 4.  Psychiatric: Normal judgment and insight. Alert and oriented x 3. Normal mood.     Labs on Admission: I have personally reviewed following labs and imaging studies  CBC: Recent Labs  Lab 10/22/20 1217  WBC 8.9  NEUTROABS 7.8*  HGB 8.4*  HCT 27.0*  MCV 98.9  PLT 973*   Basic Metabolic Panel: Recent Labs  Lab 10/22/20 1217  NA 142  K 3.5  CL 109  CO2 24  GLUCOSE 131*  BUN 29*  CREATININE 1.92*  CALCIUM 8.6*   GFR: Estimated Creatinine Clearance: 27.7 mL/min (A) (by C-G formula based on SCr of 1.92 mg/dL (H)). Liver Function Tests: Recent Labs  Lab 10/22/20 1217  AST 33  ALT 37  ALKPHOS 90  BILITOT 0.6  PROT 5.8*  ALBUMIN 2.9*   No results for input(s): LIPASE, AMYLASE in the last 168 hours. No results for input(s): AMMONIA  in the last 168 hours. Coagulation Profile: Recent Labs  Lab 10/22/20 1217  INR 2.1*   Cardiac Enzymes: No results for input(s): CKTOTAL, CKMB, CKMBINDEX, TROPONINI in the last 168 hours. BNP (last 3 results) No results for input(s): PROBNP in the last 8760 hours. HbA1C: No results for input(s): HGBA1C in the last 72 hours. CBG: No results for input(s): GLUCAP in the last 168 hours. Lipid  Profile: No results for input(s): CHOL, HDL, LDLCALC, TRIG, CHOLHDL, LDLDIRECT in the last 72 hours. Thyroid Function Tests: No results for input(s): TSH, T4TOTAL, FREET4, T3FREE, THYROIDAB in the last 72 hours. Anemia Panel: No results for input(s): VITAMINB12, FOLATE, FERRITIN, TIBC, IRON, RETICCTPCT in the last 72 hours. Urine analysis:    Component Value Date/Time   COLORURINE YELLOW 10/22/2020 1217   APPEARANCEUR CLOUDY (A) 10/22/2020 1217   LABSPEC 1.014 10/22/2020 1217   PHURINE 9.0 (H) 10/22/2020 1217   GLUCOSEU NEGATIVE 10/22/2020 1217   HGBUR MODERATE (A) 10/22/2020 1217   BILIRUBINUR NEGATIVE 10/22/2020 1217   KETONESUR NEGATIVE 10/22/2020 1217   PROTEINUR 100 (A) 10/22/2020 1217   UROBILINOGEN 1.0 03/22/2012 0845   NITRITE POSITIVE (A) 10/22/2020 1217   LEUKOCYTESUR LARGE (A) 10/22/2020 1217    Radiological Exams on Admission: DG Chest Port 1 View  Result Date: 10/22/2020 CLINICAL DATA:  Questionable sepsis - evaluate for abnormality EXAM: PORTABLE CHEST - 1 VIEW COMPARISON:  08/22/2020 FINDINGS: The mediastinal contours are within normal limits. Unchanged cardiomegaly. Postsurgical changes after left atrial appendage ligation, coronary artery bypass graft, and TAVR. Interval improved aeration of the upper lobes bilaterally. Mild bibasilar subsegmental atelectasis. No pleural effusion or pneumothorax. No acute osseous abnormality. Intact median sternotomy wires. IMPRESSION: 1. Mild bibasilar subsegmental atelectasis. 2. Interval resolution of previously visualized upper lobe consolidative opacities. Electronically Signed   By: Ruthann Cancer M.D.   On: 10/22/2020 12:29    EKG: Independently reviewed.  Rate controlled A. fib, chronic RBBB.  Assessment/Plan Active Problems:   Acute lower UTI   Encephalopathy  (please populate well all problems here in Problem List. (For example, if patient is on BP meds at home and you resume or decide to hold them, it is a problem that  needs to be her. Same for CAD, COPD, HLD and so on)  UTI complicated with indwelling Foley, with hematuria -Hematuria appears to be new, and pyuria.  Received vancomycin and cefepime in the ED, check MRSA screen, if negative can stop vancomycin.  Change cefepime to ceftriaxone for now while waiting for urine culture result. -Other infection source on the left shin ulcer and right heel ulcers all appear to be not infected at this point. -D/C Flagyl.  Metabolic encephalopathy -Probably from UTI, improved.  Check VBG to rule out CO2 retention.  AKI on CKD stage II -Clinically appears to have fluid overload with anasarca, suspect this is part of the cardiorenal syndrome, increase diuresis to Lasix 40 mg daily, monitor kidney function tomorrow. -Also ordered renal ultrasound and uric acid level.  Acute on chronic diastolic CHF decompensation -As above.  Chronic normocytic anemia -H&H stable, denied any black tarry stool -Check iron study and reticulocyte count.  HTN -Discontinue amlodipine due to worsening of peripheral edema -Continue hydralazine, Imdur and metoprolol.  Chronic A. Fib -Rate controlled, continue amiodarone and Xarelto.  Chronic RA on prednison -Double dose of steroid for a non-septic infection. -Continue Leflunomide -Renal dosed gabapentin  BPH -Continue Proscar  Morbid obesity -Check VBG to rule out CO2 retention.  Chronic right heel  ulcer -Wound care -Outpatient PCP follow-up to rule out restless leg syndrome  Generalized weakness/deconditioning -PT evaluation.   DVT prophylaxis: Xarelto Code Status: DNR Family Communication: Daughter at bedside Disposition Plan: Expect more than 2 midnight hospital stay Consults called: None Admission status: Telemetry admission.   Lequita Halt MD [facility] Pager [phone]  10/22/2020, 4:19 PM

## 2020-10-22 NOTE — ED Notes (Signed)
Rectal temperature taken, 99.3

## 2020-10-22 NOTE — Progress Notes (Signed)
Pharmacy Antibiotic Note  [name] [name] is a 87 y.o. male admitted on 10/22/2020 with suspected infection with unknown source.  Pharmacy has been consulted for cefepime and vancomycin dosing.  81 yom with a history of BPH, chronic kidney disease stage III, and hypertension. Patient presenting with  fever to 101 last night with hematuria. Patient has a chronic indwelling foley catheter.  No recent urine cultures that I can find.  SCr 1.92 - above baseline WBC 8.9; LA 1.3; afebrile  Plan: Cefepime 2g q24hr Vancomycin 2000 mg once, subsequent dosing as indicated per random vancomycin level until renal function stable and/or improved, at which time scheduled dosing can be considered Follow-up cultures and de-escalate antibiotics as appropriate.  Height: 6\' 1"  (185.4 cm) Weight: 95.3 kg (210 lb) IBW/kg (Calculated) : 79.9  Temp (24hrs), Avg:99.3 F (37.4 C), Min:99.3 F (37.4 C), Max:99.3 F (37.4 C)  Recent Labs  Lab 10/22/20 1217 10/22/20 1417  WBC 8.9  --   CREATININE 1.92*  --   LATICACIDVEN 1.9 1.3    Estimated Creatinine Clearance: 27.7 mL/min (A) (by C-G formula based on SCr of 1.92 mg/dL (H)).    Allergies  Allergen Reactions   Amoxicillin Other (See Comments)    Headache, debilitated   Bee Venom Anaphylaxis   Avelox [Moxifloxacin]     Other reaction(s): Unknown   Duloxetine Hcl     Other reaction(s): felt weird   Levitra [Vardenafil]     Other reaction(s): Unknown   Morphine And Related Other (See Comments)    hypotension   Testosterone Other (See Comments)    unknown   Viagra [Sildenafil]     Other reaction(s): didn't like it   Antimicrobials this admission: cefepime 10/22 >>  vancomycin 10/22 >>   Microbiology results: Pending  Thank you for allowing pharmacy to be a part of this patient's care.  Lorelei Pont, PharmD, BCPS 10/22/2020 3:07 PM ED Clinical Pharmacist -  [phone]

## 2020-10-22 NOTE — Sepsis Progress Note (Signed)
[facility] is monitoring this code sepsis 

## 2020-10-22 NOTE — ED Provider Notes (Signed)
[facility] EMERGENCY DEPARTMENT Provider Note   CSN: [id number] Arrival date & time: 10/22/20  1149     History No chief complaint on file.   [name] [name] is a 87 y.o. male.  HPI 87 yo male presents complaining of fever to 101 last night with hematuria.  Patient with chronic indwelling foley catheter.  Patient with mild dyspnea and oxygen sats 90% on ems arrival.  No n/v/d, no abdominal abdominal pain.  Last night had some back pain and fatigue. Kyphoplasty end of January/ Discharged August 23 after admission for t8 comprssion fracture and urinary retention, patient then in rehab for 2 weeks.  Patient has been walking with walker- fall in last week with contusion to chest wall.  Wound on left leg with some redness. Wound right heal chronic pressure with wound care clinic.     Past Medical History:  Diagnosis Date   AAA (abdominal aortic aneurysm)    Acute coronary syndrome (HCC) 05/11/2583   Acute systolic heart failure ([place]) 03/03/2012   Angina    Arthritis    Ascending aorta dilatation ([place]) 03/13/2012   Fusiform dilatation of the ascending thoracic aorta discovered during surgery, max diameter 4.2-4.3 cm   Atrial fibrillation (HCC) 02/29/2012   Recurrent paroxysmal, new-onset   BPH (benign prostatic hypertrophy)    CAD (coronary artery disease)    Cath April 2012 40% left main, 50% LAD, 40% OM, occluded RCA with L-R collaterals    CHF (congestive heart failure) (HCC)    Chronic venous insufficiency    Dyspnea    GERD (gastroesophageal reflux disease)    Glaucoma    Gout    Headache    MIGRAINES in the past   History of kidney stones    Hyperlipidemia    Hypertension    MI (myocardial infarction) ([place])    Obesity (BMI 30-39.9)    Paroxysmal atrial fibrillation (HCC) 02/29/2012   Recurrent paroxysmal, new-onset    PMR (polymyalgia rheumatica) (HCC)    Pre-diabetes    S/P CABG x 3 03/06/2012   LIMA to LAD, SVG to OM, SVG to RCA, EVH via right thigh    S/P Maze operation for atrial fibrillation 03/06/2012   Complete bilateral lesions set using bipolar radiofrequency and cryothermy ablation with clipping of LA appendage   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   s/p TAVR with a 26 mm Edwards via the left subclavian by Drs Buena Irish and Bartle   Severe aortic stenosis 09/29/2019   Sleep apnea    USES CPAP NIGHTLY    Patient Active Problem List   Diagnosis Date Noted   Overweight (BMI 25.0-29.9) 08/19/2020   Vertebral fracture, osteoporotic ([place]) 08/18/2020   T8 vertebral fracture ([place]) 08/17/2020   Thrombocytopenia ([place]) 08/17/2020   CKD (chronic kidney disease) stage 3, GFR 30-59 ml/min (HCC) 08/17/2020   Elevated troponin 08/17/2020   Pain due to onychomycosis of toenails of both feet 07/27/2020   Callus of heel 07/27/2020   Bifascicular block 11/11/2019   Acute on chronic diastolic heart failure ([place]) 11/10/2019   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   Severe aortic stenosis 09/29/2019   OSA (obstructive sleep apnea) 11/18/2014   Thoracic ascending aortic aneurysm 09/29/2012   S/P laparoscopic cholecystectomy 04/09/2012   S/P CABG x 3 03/06/2012   S/P Maze operation for atrial fibrillation 03/06/2012   Paroxysmal atrial fibrillation ([place]) 02/29/2012   CAD (coronary artery disease), native coronary artery    Hyperlipidemia  Hypertension    Chronic venous insufficiency    Gout    Benign prostatic hyperplasia     Past Surgical History:  Procedure Laterality Date   CARDIAC CATHETERIZATION     CATARACT EXTRACTION, BILATERAL     with lens implants   CHOLECYSTECTOMY N/A 03/24/2012   Procedure: LAPAROSCOPIC CHOLECYSTECTOMY WITH INTRAOPERATIVE CHOLANGIOGRAM;  Surgeon: Zenovia Jarred, MD;  Location: [facility];  Service: General;  Laterality: N/A;   CORONARY ARTERY BYPASS GRAFT N/A 03/06/2012   Procedure: CORONARY ARTERY BYPASS GRAFTING (CABG);  Surgeon: Rexene Alberts, MD;  Location: [facility];  Service: Open Heart Surgery;  Laterality: N/A;   ENDOVEIN HARVEST OF GREATER SAPHENOUS VEIN Right 03/06/2012   Procedure: ENDOVEIN HARVEST OF GREATER SAPHENOUS VEIN;  Surgeon: Rexene Alberts, MD;  Location: [facility];  Service: Open Heart Surgery;  Laterality: Right;   INTRAOPERATIVE TRANSESOPHAGEAL ECHOCARDIOGRAM N/A 03/06/2012   Procedure: INTRAOPERATIVE TRANSESOPHAGEAL ECHOCARDIOGRAM;  Surgeon: Rexene Alberts, MD;  Location: [facility];  Service: Open Heart Surgery;  Laterality: N/A;   IR KYPHO LUMBAR INC FX REDUCE BONE BX UNI/BIL CANNULATION INC/IMAGING  02/23/2020   LEFT HEART CATH Right 03/02/2012   Procedure: LEFT HEART CATH;  Surgeon: Sherren Mocha, MD;  Location: [facility] CATH LAB;  Service: Cardiovascular;  Laterality: Right;   MAZE N/A 03/06/2012   Procedure: MAZE;  Surgeon: Rexene Alberts, MD;  Location: [facility];  Service: Open Heart Surgery;  Laterality: N/A;   PROSTATECTOMY     partial   RIGHT/LEFT HEART CATH AND CORONARY/GRAFT ANGIOGRAPHY N/A 09/29/2019   Procedure: RIGHT/LEFT HEART CATH AND CORONARY/GRAFT ANGIOGRAPHY;  Surgeon: Adrian Prows, MD;  Location: [facility] CV LAB;  Service: Cardiovascular;  Laterality: N/A;   TEE WITHOUT CARDIOVERSION N/A 11/10/2019   Procedure: TRANSESOPHAGEAL ECHOCARDIOGRAM (TEE);  Surgeon: Burnell Blanks, MD;  Location: [facility];  Service: Open Heart Surgery;  Laterality: N/A;   TRANSCATHETER AORTIC VALVE REPLACEMENT, TRANSFEMORAL  11/10/2019       Family History  Problem Relation Age of Onset   Dementia Mother    Stroke Brother     Social History   Tobacco Use   Smoking status: Former    Packs/day: 0.50    Years: 15.00    Pack years: 7.50    Types: Cigarettes    Quit date: 11/18/1962    Years since quitting: 57.9   Smokeless tobacco: Never  Vaping Use   Vaping Use: Never used  Substance Use Topics   Alcohol use: Yes    Alcohol/week: 2.0 standard drinks    Types: 2 Shots of liquor per week    Comment: daily   Drug use: Never    Home  Medications Prior to Admission medications   Medication Sig Start Date End Date Taking? Authorizing Provider  acetaminophen (TYLENOL) 500 MG tablet Take 2 tablets (1,000 mg total) by mouth 3 (three) times daily. For 3 to 5 days and then change to 1000 mg 3 times daily as needed for pain. 08/23/20   Hongalgi, Lenis Dickinson, MD  amiodarone (PACERONE) 200 MG tablet Take 1 tablet (200 mg total) by mouth daily. 03/09/20   Cantwell, Celeste C, PA-C  amiodarone (PACERONE) 200 MG tablet 1 tablet    [provider]  amLODipine (NORVASC) 10 MG tablet Take 1 tablet (10 mg total) by mouth daily. 07/28/20   Cantwell, Celeste C, PA-C  cholecalciferol (VITAMIN D3) 25 MCG (1000 UNIT) tablet Take 1,000 Units by mouth daily.     [provider]  denosumab (PROLIA) 60 MG/ML SOSY injection Inject 60 mg into the skin every 6 (six) months.    [provider]  docusate sodium (COLACE) 100 MG capsule Take 1 capsule (100 mg total) by mouth 2 (two) times daily. 08/23/20   Hongalgi, Lenis Dickinson, MD  EPINEPHrine 0.3 mg/0.3 mL IJ SOAJ injection Inject 0.3 mLs (0.3 mg total) into the muscle as needed for anaphylaxis. Patient taking differently: Inject 0.3 mg into the muscle once as needed for anaphylaxis. 08/08/18   Dara Hoyer, FNP  ezetimibe (ZETIA) 10 MG tablet Take 10 mg by mouth daily.    [provider]  finasteride (PROSCAR) 5 MG tablet Take 5 mg by mouth daily. 09/15/20   [provider]  fish oil-omega-3 fatty acids 1000 MG capsule Take 1 g by mouth daily.     [provider]  fluticasone (FLONASE) 50 MCG/ACT nasal spray Place 1 spray into both nostrils daily as needed for allergies.    [provider]  gabapentin (NEURONTIN) 100 MG capsule Take 100 mg by mouth 3 (three) times daily.    [provider]  galantamine (RAZADYNE ER) 8 MG 24 hr capsule Take 8 mg by mouth Daily. 02/26/11   [provider]  HONEY BEE VENOM IJ Inject 2 each as directed See admin  instructions. Every eight weeks    [provider]  hydrALAZINE (APRESOLINE) 25 MG tablet Take 25 mg by mouth 2 (two) times daily. 08/09/20   [provider]  isosorbide mononitrate (IMDUR) 60 MG 24 hr tablet TAKE 1 TABLET(60 MG) BY MOUTH DAILY AFTER SUPPER 10/10/20   Adrian Prows, MD  leflunomide (ARAVA) 10 MG tablet Take 10 mg by mouth daily. 08/24/20   [provider]  magnesium gluconate (MAGONATE) 500 MG tablet Take 500 mg by mouth daily.    [provider]  memantine (NAMENDA) 10 MG tablet Take 10 mg by mouth 2 (two) times daily. 09/08/19   [provider]  metoprolol succinate (TOPROL-XL) 25 MG 24 hr tablet Take 1 tablet (25 mg total) by mouth daily as needed (for high heart rate, consistently greater than 110/min.). 08/23/20   Hongalgi, Lenis Dickinson, MD  nitroGLYCERIN (NITROSTAT) 0.4 MG SL tablet Place 1 tablet (0.4 mg total) under the tongue every 5 (five) minutes x 3 doses as needed for chest pain. 12/16/19   Cantwell, Celeste C, PA-C  oxyCODONE (OXY IR/ROXICODONE) 5 MG immediate release tablet Take 0.5 tablets (2.5 mg total) by mouth every 12 (twelve) hours as needed for breakthrough pain. 08/23/20   Hongalgi, Lenis Dickinson, MD  pantoprazole (PROTONIX) 40 MG tablet Take 40 mg by mouth every other day.  10/28/14   [provider]  polyethylene glycol (MIRALAX / GLYCOLAX) 17 g packet Take 17 g by mouth 2 (two) times daily. 08/23/20   Hongalgi, Lenis Dickinson, MD  potassium chloride (MICRO-K) 10 MEQ CR capsule TAKE ONE CAPSULE BY MOUTH DAILY WITH LASIX 10/03/20   Cantwell, Celeste C, PA-C  predniSONE (DELTASONE) 1 MG tablet Take 9 mg by mouth daily with breakfast.    [provider]  PRESCRIPTION MEDICATION Cpap    [provider]  Psyllium Husk POWD Take 1 Scoop by mouth daily.     [provider]  Rivaroxaban (XARELTO) 15 MG TABS tablet Take 15 mg by mouth daily with supper.    [provider]  tamsulosin (FLOMAX) 0.4 MG CAPS  capsule Take 1 capsule (0.4 mg total) by mouth daily after supper. 08/23/20  Modena Jansky, MD  torsemide (DEMADEX) 20 MG tablet Take 2 tablets (40 mg total) by mouth daily. 09/16/20   Cantwell, Celeste C, PA-C  triamcinolone cream (KENALOG) 0.1 % Apply 1 application topically daily as needed (skin irritation).    [provider]  vitamin B-12 (CYANOCOBALAMIN) 1000 MCG tablet Take 1,000 mcg by mouth daily.     [provider]    Allergies    Amoxicillin, Bee venom, Avelox [moxifloxacin], Duloxetine hcl, Levitra [vardenafil], Morphine and related, Testosterone, and Viagra [sildenafil]  Review of Systems   Review of Systems  Physical Exam Updated Vital Signs BP 111/60   Pulse (!) 116   Temp 99.3 F (37.4 C) (Rectal)   Resp 16   Ht 1.854 m (6\' 1" )   Wt 95.3 kg   SpO2 90%   BMI 27.71 kg/m   Physical Exam  ED Results / Procedures / Treatments   Labs (all labs ordered are listed, but only abnormal results are displayed) Labs Reviewed  COMPREHENSIVE METABOLIC PANEL - Abnormal; Notable for the following components:      Result Value   Glucose, Bld 131 (*)    BUN 29 (*)    Creatinine, Ser 1.92 (*)    Calcium 8.6 (*)    Total Protein 5.8 (*)    Albumin 2.9 (*)    GFR, Estimated 32 (*)    All other components within normal limits  CBC WITH DIFFERENTIAL/PLATELET - Abnormal; Notable for the following components:   RBC 2.73 (*)    Hemoglobin 8.4 (*)    HCT 27.0 (*)    RDW 15.8 (*)    Platelets 123 (*)    Neutro Abs 7.8 (*)    Lymphs Abs 0.4 (*)    All other components within normal limits  PROTIME-INR - Abnormal; Notable for the following components:   Prothrombin Time 23.7 (*)    INR 2.1 (*)    All other components within normal limits  CULTURE, BLOOD (SINGLE)  URINE CULTURE  LACTIC ACID, PLASMA  APTT  LACTIC ACID, PLASMA  URINALYSIS, ROUTINE W REFLEX MICROSCOPIC    EKG EKG Interpretation  Date/Time:  Saturday October 22 2020 11:58:46  EDT Ventricular Rate:  59 PR Interval:    QRS Duration: 149 QT Interval:  487 QTC Calculation: 483 R Axis:   -22 Text Interpretation: Atrial fibrillation Right bundle branch block Confirmed by Pattricia Boss [id number]) on 10/22/2020 2:10:49 PM  Radiology DG Chest Port 1 View  Result Date: 10/22/2020 CLINICAL DATA:  Questionable sepsis - evaluate for abnormality EXAM: PORTABLE CHEST - 1 VIEW COMPARISON:  08/22/2020 FINDINGS: The mediastinal contours are within normal limits. Unchanged cardiomegaly. Postsurgical changes after left atrial appendage ligation, coronary artery bypass graft, and TAVR. Interval improved aeration of the upper lobes bilaterally. Mild bibasilar subsegmental atelectasis. No pleural effusion or pneumothorax. No acute osseous abnormality. Intact median sternotomy wires. IMPRESSION: 1. Mild bibasilar subsegmental atelectasis. 2. Interval resolution of previously visualized upper lobe consolidative opacities. Electronically Signed   By: Ruthann Cancer M.D.   On: 10/22/2020 12:29    Procedures Procedures   Medications Ordered in ED Medications - No data to display  ED Course  I have reviewed the triage vital signs and the nursing notes.  Pertinent labs & imaging results that were available during my care of the patient were reviewed by me and considered in my medical decision making (see chart for details).  Clinical Course as of 10/22/20 1411  Sat Oct 22, 2020  1340  HCT(!): 27.0 [DR]  1340 Hemoglobin(!): 8.4 [DR]  1340 Hemoglobin(!): 8.4 [DR]    Clinical Course User Index [DR] Pattricia Boss, MD   MDM Rules/Calculators/A&P                           1-fever-she had fever to 101 at home.  His wife gave him acetaminophen.  Sources include probable urine although this is from a catheterized specimen.  Plan culture. Additionally has redness of his left lower extremity remedy with a wound present.  And this likely represents some cellulitis. Patient given broad-spectrum  antibiotics here.  Lactic is negative.  He has been hemodynamically stable here in the ED.  There is a one-time heart rate documented at 116 which is likely from the monitor with beat-to-beat variation of his A. fib 2- anemia patient's hemoglobin is stable from prior.  He is on Eliquis.  They have not had any previous work-up.  I discussed utility given patient's age of pursuing this.  His wife will discuss with primary care doctor.  Today's hemoglobin is stable from prior and he does not appear to need transfusion at this time 3 AKI patient with creatinine today of 1.92.  On discharge 823 it was 1.49.  Will need hydration and followed. Final Clinical Impression(s) / ED Diagnoses Final diagnoses:  Urinary tract infection with hematuria, site unspecified  Cellulitis of left lower extremity  AKI (acute kidney injury) [facility])    Rx / DC Orders ED Discharge Orders     None        Pattricia Boss, MD 10/22/20 1546

## 2020-10-22 NOTE — Consult Note (Signed)
[facility] Nurse Consult Note: Reason for Consult:Right heel wound. Patient is followed in the community by Jeri Cos III, PA-C at the outpatient wound care center at [facility]. Healing Stage 3 Wound type:Pressure Pressure Injury POA: Yes Measurement:Per outpatient [facility] on Wednesday, 10/19/20. Right lateral heel: 1.4cm x 1.6cm x 0.1cm Right medial heel: 1.2cm x 1.0cm x 0.1cm Wound bed: pink, moist with <30% yellow adherent slough Drainage (amount, consistency, odor) small serous Periwound: intact Dressing procedure/placement/frequency: I have reviewed the POC in place from the outpatient wound care center at which the patient was last seen on Wednesday, 10/19 and will continue that POC using a silver hydrofiber dressing with daily changes and pressure redistribution via a Prevalon boot.  Turning and repositioning is in place and a sacral foam is added to prevent PI to the sacrum.  [facility] nursing team will not follow, but will remain available to this patient, the nursing and medical teams.  Please re-consult if needed. Thanks, Maudie Flakes, MSN, RN, [place], Arther Abbott  Pager# [phone]

## 2020-10-23 DIAGNOSIS — L8990 Pressure ulcer of unspecified site, unspecified stage: Secondary | ICD-10-CM | POA: Insufficient documentation

## 2020-10-23 DIAGNOSIS — N390 Urinary tract infection, site not specified: Secondary | ICD-10-CM | POA: Diagnosis not present

## 2020-10-23 DIAGNOSIS — R319 Hematuria, unspecified: Secondary | ICD-10-CM | POA: Diagnosis not present

## 2020-10-23 LAB — CBC WITH DIFFERENTIAL/PLATELET
Abs Immature Granulocytes: 0.03 10*3/uL (ref 0.00–0.07)
Basophils Absolute: 0 10*3/uL (ref 0.0–0.1)
Basophils Relative: 1 %
Eosinophils Absolute: 0 10*3/uL (ref 0.0–0.5)
Eosinophils Relative: 0 %
HCT: 24.8 % — ABNORMAL LOW (ref 39.0–52.0)
Hemoglobin: 8 g/dL — ABNORMAL LOW (ref 13.0–17.0)
Immature Granulocytes: 0 %
Lymphocytes Relative: 9 %
Lymphs Abs: 0.7 10*3/uL (ref 0.7–4.0)
MCH: 30.9 pg (ref 26.0–34.0)
MCHC: 32.3 g/dL (ref 30.0–36.0)
MCV: 95.8 fL (ref 80.0–100.0)
Monocytes Absolute: 0.6 10*3/uL (ref 0.1–1.0)
Monocytes Relative: 8 %
Neutro Abs: 6.6 10*3/uL (ref 1.7–7.7)
Neutrophils Relative %: 82 %
Platelets: 112 10*3/uL — ABNORMAL LOW (ref 150–400)
RBC: 2.59 MIL/uL — ABNORMAL LOW (ref 4.22–5.81)
RDW: 15.7 % — ABNORMAL HIGH (ref 11.5–15.5)
Smear Review: DECREASED
WBC: 8.1 10*3/uL (ref 4.0–10.5)
nRBC: 0 % (ref 0.0–0.2)

## 2020-10-23 LAB — BASIC METABOLIC PANEL
Anion gap: 10 (ref 5–15)
BUN: 21 mg/dL (ref 8–23)
CO2: 23 mmol/L (ref 22–32)
Calcium: 8.1 mg/dL — ABNORMAL LOW (ref 8.9–10.3)
Chloride: 107 mmol/L (ref 98–111)
Creatinine, Ser: 1.69 mg/dL — ABNORMAL HIGH (ref 0.61–1.24)
GFR, Estimated: 38 mL/min — ABNORMAL LOW (ref >60)
Glucose, Bld: 110 mg/dL — ABNORMAL HIGH (ref 70–99)
Potassium: 3.3 mmol/L — ABNORMAL LOW (ref 3.5–5.1)
Sodium: 140 mmol/L (ref 135–145)

## 2020-10-23 LAB — MAGNESIUM: Magnesium: 2.1 mg/dL (ref 1.7–2.4)

## 2020-10-23 LAB — CORTISOL: Cortisol, Plasma: 25.1 ug/dL

## 2020-10-23 MED ORDER — SENNA 8.6 MG PO TABS
1.0000 | ORAL_TABLET | Freq: Every day | ORAL | Status: DC | PRN
  Administered 2020-10-23: 8.6 mg via ORAL
  Filled 2020-10-23: qty 1

## 2020-10-23 MED ORDER — POTASSIUM CHLORIDE CRYS ER 20 MEQ PO TBCR
40.0000 meq | EXTENDED_RELEASE_TABLET | Freq: Once | ORAL | Status: AC
  Administered 2020-10-23: 40 meq via ORAL
  Filled 2020-10-23: qty 2

## 2020-10-23 MED ORDER — DOCUSATE SODIUM 100 MG PO CAPS
100.0000 mg | ORAL_CAPSULE | Freq: Two times a day (BID) | ORAL | Status: DC
  Administered 2020-10-23 – 2020-10-27 (×7): 100 mg via ORAL
  Filled 2020-10-23 (×8): qty 1

## 2020-10-23 NOTE — Plan of Care (Signed)
  Problem: Education: Goal: Knowledge of General Education information will improve Description: Including pain rating scale, medication(s)/side effects and non-pharmacologic comfort measures Outcome: Progressing   Problem: Health Behavior/Discharge Planning: Goal: Ability to manage health-related needs will improve Outcome: Progressing   Problem: Clinical Measurements: Goal: Will remain free from infection Outcome: Progressing   

## 2020-10-23 NOTE — Progress Notes (Signed)
Progress Note    [name] [name]  KDT:[id number] DOB: 1926-10-17  DOA: 10/22/2020 PCP: [name], MD    Brief Narrative:    Medical records reviewed and are as summarized below:  [name] [name] is an 87 y.o. male with medical history significant of BPH with chronic urethral obstruction on indwelling Foley catheter, AS status post TAVR, CAD status post CABG, HTN, CKD stage II, chronic diastolic CHF, HLD, morbid obesity, came with malaise, generalized weakness for 1 day.   Family medicine patient has been feeling lethargic and sleepy since yesterday, spiked fever in the evening 101.3, and wife gave some Tylenol and fever broke.  Family also noticed the patient started to have blood in the urine   Assessment/Plan:   Active Problems:   Urinary tract infection with hematuria   Encephalopathy   Pressure injury of skin    UTI complicated with indwelling Foley, with hematuria -Hematuria appears to be new, and pyuria.  Received vancomycin and cefepime in the ED - Change cefepime to ceftriaxone for now while waiting for urine culture result. -Other infection source on the left shin ulcer and right heel ulcers all appear to be not infected at this point. -change foley   Metabolic encephalopathy -Probably from UTI, improved   AKI on CKD stage II -hold lasix as BP low and recheck BMP in AM   Chronic normocytic anemia -H&H stable, denied any black tarry stool -IV Fe prior to d/c   HTN -Discontinue amlodipine due to worsening of peripheral edema -BP low so hold all meds   Chronic A. Fib -Rate controlled, continue amiodarone and Xarelto.   Chronic RA on prednisone -Double dose of steroid for a non-septic infection. -Continue Leflunomide -Renal dosed gabapentin -check cortisol as BP low and may need stress dose   BPH -Continue Proscar   Chronic right heel ulcer -Wound care   Generalized weakness/deconditioning -PT evaluation.   Hypotension -see  above  Hypokalemia -replete    Family Communication/Anticipated D/C date and plan/Code Status   DVT prophylaxis: xarelto Code Status: DNR Disposition Plan: Status is: Inpatient  Remains inpatient appropriate because: needs IV abx         Medical Consultants:   None.     Subjective:   Feeling some better but not his normal  Objective:    Vitals:   10/22/20 1715 10/22/20 2054 10/23/20 0405 10/23/20 0735  BP: (!) 147/99 109/67 (!) 100/56 (!) 92/57  Pulse: 91 90 70 72  Resp: (!) 21 18 20 18   Temp:  98 F (36.7 C) (!) 100.6 F (38.1 C) 98.8 F (37.1 C)  TempSrc:  Oral Oral Oral  SpO2: 93% 95% 98% 98%  Weight:      Height:        Intake/Output Summary (Last 24 hours) at 10/23/2020 1134 Last data filed at 10/23/2020 0409 Gross per 24 hour  Intake 1161.61 ml  Output 2450 ml  Net -1288.39 ml   Filed Weights   10/22/20 1214  Weight: 95.3 kg    Exam:  General: Appearance:     Overweight male who appears ill     Lungs:     respirations unlabored, laying flat  Heart:    Normal heart rate   MS:   All extremities are intact.    Neurologic:   Awake, alert     Data Reviewed:   I have personally reviewed following labs and imaging studies:  Labs: Labs show the following:   Basic Metabolic Panel:  Recent Labs  Lab 10/22/20 1217 10/23/20 0241  NA 142 140  K 3.5 3.3*  CL 109 107  CO2 24 23  GLUCOSE 131* 110*  BUN 29* 21  CREATININE 1.92* 1.69*  CALCIUM 8.6* 8.1*   GFR Estimated Creatinine Clearance: 31.5 mL/min (A) (by C-G formula based on SCr of 1.69 mg/dL (H)). Liver Function Tests: Recent Labs  Lab 10/22/20 1217  AST 33  ALT 37  ALKPHOS 90  BILITOT 0.6  PROT 5.8*  ALBUMIN 2.9*   No results for input(s): LIPASE, AMYLASE in the last 168 hours. No results for input(s): AMMONIA in the last 168 hours. Coagulation profile Recent Labs  Lab 10/22/20 1217  INR 2.1*    CBC: Recent Labs  Lab 10/22/20 1217 10/23/20 0241  WBC  8.9 8.1  NEUTROABS 7.8* 6.6  HGB 8.4* 8.0*  HCT 27.0* 24.8*  MCV 98.9 95.8  PLT 123* 112*   Cardiac Enzymes: Recent Labs  Lab 10/22/20 1856  CKTOTAL 52   BNP (last 3 results) No results for input(s): PROBNP in the last 8760 hours. CBG: No results for input(s): GLUCAP in the last 168 hours. D-Dimer: No results for input(s): DDIMER in the last 72 hours. Hgb A1c: No results for input(s): HGBA1C in the last 72 hours. Lipid Profile: No results for input(s): CHOL, HDL, LDLCALC, TRIG, CHOLHDL, LDLDIRECT in the last 72 hours. Thyroid function studies: No results for input(s): TSH, T4TOTAL, T3FREE, THYROIDAB in the last 72 hours.  Invalid input(s): FREET3 Anemia work up: Recent Labs    10/22/20 1856  FERRITIN 92  TIBC 325  IRON 13*  RETICCTPCT 1.5   Sepsis Labs: Recent Labs  Lab 10/22/20 1217 10/22/20 1417 10/23/20 0241  WBC 8.9  --  8.1  LATICACIDVEN 1.9 1.3  --     Microbiology Recent Results (from the past 240 hour(s))  Resp Panel by RT-PCR (Flu A&B, Covid) Nasopharyngeal Swab     Status: None   Collection Time: 10/22/20  5:53 PM   Specimen: Nasopharyngeal Swab; Nasopharyngeal(NP) swabs in vial transport medium  Result Value Ref Range Status   SARS Coronavirus 2 by RT PCR NEGATIVE NEGATIVE Final    Comment: (NOTE) SARS-CoV-2 target nucleic acids are NOT DETECTED.  The SARS-CoV-2 RNA is generally detectable in upper respiratory specimens during the acute phase of infection. The lowest concentration of SARS-CoV-2 viral copies this assay can detect is 138 copies/mL. A negative result does not preclude SARS-Cov-2 infection and should not be used as the sole basis for treatment or other patient management decisions. A negative result may occur with  improper specimen collection/handling, submission of specimen other than nasopharyngeal swab, presence of viral mutation(s) within the areas targeted by this assay, and inadequate number of viral copies(<138  copies/mL). A negative result must be combined with clinical observations, patient history, and epidemiological information. The expected result is Negative.  Fact Sheet for Patients:  EntrepreneurPulse.com.au  Fact Sheet for Healthcare Providers:  IncredibleEmployment.be  This test is no t yet approved or cleared by the [place] FDA and  has been authorized for detection and/or diagnosis of SARS-CoV-2 by FDA under an Emergency Use Authorization (EUA). This EUA will remain  in effect (meaning this test can be used) for the duration of the COVID-19 declaration under Section 564(b)(1) of the Act, 21 U.S.C.section 360bbb-3(b)(1), unless the authorization is terminated  or revoked sooner.       Influenza A by PCR NEGATIVE NEGATIVE Final   Influenza B by PCR NEGATIVE NEGATIVE  Final    Comment: (NOTE) The Xpert Xpress SARS-CoV-2/FLU/RSV plus assay is intended as an aid in the diagnosis of influenza from Nasopharyngeal swab specimens and should not be used as a sole basis for treatment. Nasal washings and aspirates are unacceptable for Xpert Xpress SARS-CoV-2/FLU/RSV testing.  Fact Sheet for Patients: EntrepreneurPulse.com.au  Fact Sheet for Healthcare Providers: IncredibleEmployment.be  This test is not yet approved or cleared by the [place] FDA and has been authorized for detection and/or diagnosis of SARS-CoV-2 by FDA under an Emergency Use Authorization (EUA). This EUA will remain in effect (meaning this test can be used) for the duration of the COVID-19 declaration under Section 564(b)(1) of the Act, 21 U.S.C. section 360bbb-3(b)(1), unless the authorization is terminated or revoked.  Performed at [facility], [place] [address], [place], [place] [address]     Procedures and diagnostic studies:  US RENAL  Result Date: 2020/10/31 CLINICAL DATA:  Acute kidney injury. EXAM: RENAL /  URINARY TRACT ULTRASOUND COMPLETE COMPARISON:  Abdominal ultrasound 03/22/2012. Abdominal CTA 10/13/2019. FINDINGS: Right Kidney: Renal measurements: 10.2 x 6.5 x 6.6 cm = volume: 227.6 mL. Mild renal cortical thinning with prominent renal sinus fat. There are small renal cysts, measuring up to 2.9 cm in the upper pole. No hydronephrosis. Left Kidney: Renal measurements: 9.5 x 5.9 x 4.5 cm = volume: 132.8 mL. Mild renal cortical thinning with prominent renal sinus fat. The left kidney is not optimally visualized. No focal cortical abnormality or hydronephrosis identified. Bladder: Decompressed by Foley catheter. Other: Examination limited by body habitus. IMPRESSION: 1. Bilateral renal cortical thinning with decreased renal size compared with remote abdominal ultrasound, consistent with chronic medical renal disease. 2. No evidence of hydronephrosis. Electronically Signed   By: Richardean Sale M.D.   On: 31-Oct-2020 16:58   DG Chest Port 1 View  Result Date: 10-31-20 CLINICAL DATA:  Questionable sepsis - evaluate for abnormality EXAM: PORTABLE CHEST - 1 VIEW COMPARISON:  08/22/2020 FINDINGS: The mediastinal contours are within normal limits. Unchanged cardiomegaly. Postsurgical changes after left atrial appendage ligation, coronary artery bypass graft, and TAVR. Interval improved aeration of the upper lobes bilaterally. Mild bibasilar subsegmental atelectasis. No pleural effusion or pneumothorax. No acute osseous abnormality. Intact median sternotomy wires. IMPRESSION: 1. Mild bibasilar subsegmental atelectasis. 2. Interval resolution of previously visualized upper lobe consolidative opacities. Electronically Signed   By: Ruthann Cancer M.D.   On: 10-31-20 12:29    Medications:    amiodarone  200 mg Oral Daily   Chlorhexidine Gluconate Cloth  6 each Topical Q0600   docusate sodium  100 mg Oral BID   ezetimibe  10 mg Oral Daily   finasteride  5 mg Oral Daily   gabapentin  100 mg Oral TID    galantamine  8 mg Oral Q breakfast   hydrALAZINE  25 mg Oral BID   isosorbide mononitrate  60 mg Oral Daily   leflunomide  10 mg Oral Daily   memantine  10 mg Oral BID   pantoprazole  40 mg Oral QODAY   potassium chloride  40 mEq Oral Once   predniSONE  16 mg Oral Q breakfast   psyllium  1 packet Oral Daily   Rivaroxaban  15 mg Oral Q supper   vancomycin variable dose per unstable renal function (pharmacist dosing)   Does not apply See admin instructions   Continuous Infusions:  cefTRIAXone (ROCEPHIN)  IV 2 g (10/23/20 1039)     LOS: 1 day   Tomi Bamberger  Eliseo Squires  [facility]   How to contact the [facility] Attending or Consulting provider [place] or covering provider during after hours [place], for this patient?  Check the care team in [facility] and look for a) attending/consulting TRH provider listed and b) the [facility] team listed Log into [email or web address] and use [place]'s universal password to access. If you do not have the password, please contact the hospital operator. Locate the [facility] provider you are looking for under [facility] and page to a number that you can be directly reached. If you still have difficulty reaching the provider, please page the [facility] (Director on Call) for the Hospitalists listed on amion for assistance.  10/23/2020, 11:34 AM

## 2020-10-23 NOTE — Evaluation (Signed)
Physical Therapy Evaluation Patient Details Name: [name] [name] MRN: [id number] DOB: [birth year] Today's Date: 10/23/2020  History of Present Illness  87 y.o. male presents to [facility] hospital on 10/22/2020 with weakness and malaise. Pt admitted for UTI. PMH includes BPH with chronic urethral obstruction on indwelling Foley catheter, AS status post TAVR, CAD status post CABG, HTN, CKD stage II, chronic diastolic CHF, HLD, morbid obesity.  Clinical Impression  Pt presents to PT with deficits in activity tolerance, gait, balance, power, strength, and back pain. Pt reports limitations in ambulation distances due to back pain since fall and fracture over the summer. Pt benefits from PT assistance from safety, although not needing physical assistance currently during this session. Pt is generally weak and will benefit from aggressive mobilization to improve activity tolerance and reduce falls risk. PT discusses benefits of a transport chair to aide in energy conservation with the patient's multiple medical appointments. PT recommends HHPT at the time of discharge.       Recommendations for follow up therapy are one component of a multi-disciplinary discharge planning process, led by the attending physician.  Recommendations may be updated based on patient status, additional functional criteria and insurance authorization.  Follow Up Recommendations Home health PT;Supervision for mobility/OOB    Equipment Recommendations  Other (comment) (transport chair, spouse looking into renting one)    Recommendations for Other Services       Precautions / Restrictions Precautions Precautions: Fall Precaution Comments: R heel ulcer Restrictions Weight Bearing Restrictions: No      Mobility  Bed Mobility Overal bed mobility: Needs Assistance Bed Mobility: Supine to Sit     Supine to sit: Supervision;HOB elevated          Transfers Overall transfer level: Needs assistance Equipment used: 4-wheeled  walker Transfers: Sit to/from Stand Sit to Stand: Min guard;From elevated surface         General transfer comment: higher bed to simulate bed at home  Ambulation/Gait Ambulation/Gait assistance: Min guard Gait Distance (Feet): 25 Feet Assistive device: 4-wheeled walker Gait Pattern/deviations: Step-through pattern Gait velocity: reduced Gait velocity interpretation: <1.8 ft/sec, indicate of risk for recurrent falls General Gait Details: pt with slowed step-through gait, gait speed slowly decreases further with increased ambulation distance  Stairs            Wheelchair Mobility    Modified Rankin (Stroke Patients Only)       Balance Overall balance assessment: Needs assistance Sitting-balance support: No upper extremity supported;Feet supported Sitting balance-Leahy Scale: Fair     Standing balance support: Single extremity supported;Bilateral upper extremity supported Standing balance-Leahy Scale: Poor Standing balance comment: reliant on UE support of walker                             Pertinent Vitals/Pain Pain Assessment: Faces Faces Pain Scale: Hurts even more Pain Location: back Pain Descriptors / Indicators: Grimacing Pain Intervention(s): Monitored during session    Home Living Family/patient expects to be discharged to:: Private residence Living Arrangements: Spouse/significant other Available Help at Discharge: Family;Available 24 hours/day Type of Home: House Home Access: Stairs to enter (plans to build ramp soon) Entrance Stairs-Rails: Can reach both Entrance Stairs-Number of Steps: 5 Home Layout: Two level;Able to live on main level with bedroom/bathroom Home Equipment: Gilford Rile - 2 wheels;Walker - 4 wheels;Grab bars - toilet;Grab bars - tub/shower;Shower seat;Bedside commode;Other (comment) (lift chair)      Prior Function Level of Independence: Needs assistance  Gait / Transfers Assistance Needed: pt reports ambulating with  Rollator for household distances. Having more difficulty recently with tolerance for mobility being largely limited by back pain  ADL's / Homemaking Assistance Needed: Wife assists as needed        Hand Dominance        Extremity/Trunk Assessment   Upper Extremity Assessment Upper Extremity Assessment: Overall WFL for tasks assessed    Lower Extremity Assessment Lower Extremity Assessment: Generalized weakness    Cervical / Trunk Assessment Cervical / Trunk Assessment: Kyphotic  Communication   Communication: No difficulties  Cognition Arousal/Alertness: Awake/alert Behavior During Therapy: WFL for tasks assessed/performed Overall Cognitive Status: Within Functional Limits for tasks assessed                                        General Comments General comments (skin integrity, edema, etc.): pt on 0.5L [place] upon PT arrival, PT weans pt to RA with sats from 90-92%    Exercises     Assessment/Plan    PT Assessment Patient needs continued PT services  PT Problem List Decreased strength;Decreased activity tolerance;Decreased balance;Decreased mobility;Decreased knowledge of use of DME;Decreased knowledge of precautions;Pain       PT Treatment Interventions DME instruction;Gait training;Stair training;Functional mobility training;Therapeutic activities;Therapeutic exercise;Balance training;Neuromuscular re-education;Patient/family education    PT Goals (Current goals can be found in the Care Plan section)  Acute Rehab PT Goals Patient Stated Goal: to improve tolerance for ambulation and reduce falls risk PT Goal Formulation: With patient/family Time For Goal Achievement: 11/06/20 Potential to Achieve Goals: Fair    Frequency Min 3X/week   Barriers to discharge        Co-evaluation               AM-PAC PT "6 Clicks" Mobility  Outcome Measure Help needed turning from your back to your side while in a flat bed without using bedrails?: A  Little Help needed moving from lying on your back to sitting on the side of a flat bed without using bedrails?: A Little Help needed moving to and from a bed to a chair (including a wheelchair)?: A Little Help needed standing up from a chair using your arms (e.g., wheelchair or bedside chair)?: A Little Help needed to walk in hospital room?: A Little Help needed climbing 3-5 steps with a railing? : A Lot 6 Click Score: 17    End of Session   Activity Tolerance: Patient limited by fatigue;Patient limited by pain Patient left: in chair;with call bell/phone within reach;with family/visitor present Nurse Communication: Mobility status PT Visit Diagnosis: Other abnormalities of gait and mobility (R26.89);Muscle weakness (generalized) (M62.81);Pain Pain - part of body:  (back)    Time: 1122-1206 PT Time Calculation (min) (ACUTE ONLY): 44 min   Charges:   PT Evaluation $PT Eval Low Complexity: [place], PT, DPT Acute Rehabilitation Pager: [phone] Office [phone]   Zenaida Niece 10/23/2020, 12:58 PM

## 2020-10-24 DIAGNOSIS — N179 Acute kidney failure, unspecified: Secondary | ICD-10-CM | POA: Diagnosis not present

## 2020-10-24 DIAGNOSIS — L97411 Non-pressure chronic ulcer of right heel and midfoot limited to breakdown of skin: Secondary | ICD-10-CM

## 2020-10-24 DIAGNOSIS — G9340 Encephalopathy, unspecified: Secondary | ICD-10-CM

## 2020-10-24 DIAGNOSIS — N390 Urinary tract infection, site not specified: Secondary | ICD-10-CM | POA: Diagnosis not present

## 2020-10-24 DIAGNOSIS — R319 Hematuria, unspecified: Secondary | ICD-10-CM | POA: Diagnosis not present

## 2020-10-24 DIAGNOSIS — I70234 Atherosclerosis of native arteries of right leg with ulceration of heel and midfoot: Secondary | ICD-10-CM

## 2020-10-24 DIAGNOSIS — N182 Chronic kidney disease, stage 2 (mild): Secondary | ICD-10-CM

## 2020-10-24 LAB — CBC
HCT: 26.8 % — ABNORMAL LOW (ref 39.0–52.0)
Hemoglobin: 8.4 g/dL — ABNORMAL LOW (ref 13.0–17.0)
MCH: 30 pg (ref 26.0–34.0)
MCHC: 31.3 g/dL (ref 30.0–36.0)
MCV: 95.7 fL (ref 80.0–100.0)
Platelets: 111 10*3/uL — ABNORMAL LOW (ref 150–400)
RBC: 2.8 MIL/uL — ABNORMAL LOW (ref 4.22–5.81)
RDW: 15.3 % (ref 11.5–15.5)
WBC: 7.2 10*3/uL (ref 4.0–10.5)
nRBC: 0 % (ref 0.0–0.2)

## 2020-10-24 LAB — BASIC METABOLIC PANEL
Anion gap: 6 (ref 5–15)
BUN: 18 mg/dL (ref 8–23)
CO2: 23 mmol/L (ref 22–32)
Calcium: 8.1 mg/dL — ABNORMAL LOW (ref 8.9–10.3)
Chloride: 109 mmol/L (ref 98–111)
Creatinine, Ser: 1.32 mg/dL — ABNORMAL HIGH (ref 0.61–1.24)
GFR, Estimated: 51 mL/min — ABNORMAL LOW (ref >60)
Glucose, Bld: 98 mg/dL (ref 70–99)
Potassium: 3.3 mmol/L — ABNORMAL LOW (ref 3.5–5.1)
Sodium: 138 mmol/L (ref 135–145)

## 2020-10-24 MED ORDER — COVID-19 MRNA VAC-TRIS(PFIZER) 30 MCG/0.3ML IM SUSP
0.3000 mL | Freq: Once | INTRAMUSCULAR | Status: DC
  Filled 2020-10-24: qty 0.3

## 2020-10-24 MED ORDER — POTASSIUM CHLORIDE CRYS ER 20 MEQ PO TBCR
40.0000 meq | EXTENDED_RELEASE_TABLET | Freq: Once | ORAL | Status: AC
  Administered 2020-10-24: 40 meq via ORAL
  Filled 2020-10-24: qty 2

## 2020-10-24 MED ORDER — COVID-19MRNA BIVAL VACC PFIZER 30 MCG/0.3ML IM SUSP
0.3000 mL | Freq: Once | INTRAMUSCULAR | Status: AC
  Administered 2020-10-24: 0.3 mL via INTRAMUSCULAR
  Filled 2020-10-24: qty 0.3

## 2020-10-24 NOTE — TOC Initial Note (Signed)
Transition of Care [facility]) - Initial/Assessment Note    Patient Details  Name: [name] [name] MRN: [id number] Date of Birth: 09-Jan-1926  [facility]) CM/SW Contact:    Joanne Chars, LCSW Phone Number: 10/24/2020, 3:04 PM  Clinical Narrative: CSW met with pt regarding recommendation for [facility].  Pt alert, appears oriented and participates in discussion today.  Pt reports [facility] already in place and he would like to continue with them at DC.  Permission given to speak with wife [name], daughters Rodena Piety and Sharyn Lull.  PCP in place.  Current DME in home: walker, shower chair.  Pt reports he is vaccinated for covid with 2 boosters.  CSW confirmed with Stacie at [facility] that pt is open for services with them.  CSW spoke with pt wife [name] who is in agreement with this plan.  She would like to get wheelchair at DC.              Expected Discharge Plan: [place] Barriers to Discharge: Continued Medical Work up   Patient Goals and CMS Choice Patient states their goals for this hospitalization and ongoing recovery are:: "fully mobile" CMS Medicare.gov Compare Post Acute Care list provided to::  ([facility] in place and pt wants to continue with them)    Expected Discharge Plan and Services Expected Discharge Plan: [place] Choice: Home Health, Durable Medical Equipment Living arrangements for the past 2 months: Single Family Home                                      Prior Living Arrangements/Services Living arrangements for the past 2 months: Single Family Home Lives with:: Spouse Patient language and need for interpreter reviewed:: Yes Do you feel safe going back to the place where you live?: Yes      Need for Family Participation in Patient Care: Yes (Comment) Care giver support system in place?: Yes (comment) Current home services: Home OT, Home PT ([place]) Criminal Activity/Legal  Involvement Pertinent to Current Situation/Hospitalization: No - Comment as needed  Activities of Daily Living Home Assistive Devices/Equipment: Environmental consultant (specify type) [place] manager) ADL Screening (condition at time of admission) Patient's cognitive ability adequate to safely complete daily activities?: Yes Is the patient deaf or have difficulty hearing?: No Does the patient have difficulty seeing, even when wearing glasses/contacts?: No Does the patient have difficulty concentrating, remembering, or making decisions?: No Patient able to express need for assistance with ADLs?: Yes Does the patient have difficulty dressing or bathing?: Yes Independently performs ADLs?: No Communication: Independent Dressing (OT): Needs assistance Is this a change from baseline?: Pre-admission baseline Grooming: Needs assistance Is this a change from baseline?: Pre-admission baseline Feeding: Independent Bathing: Needs assistance Is this a change from baseline?: Pre-admission baseline Toileting: Needs assistance Is this a change from baseline?: Pre-admission baseline In/Out Bed: Needs assistance Is this a change from baseline?: Pre-admission baseline Walks in Home: Needs assistance Is this a change from baseline?: Pre-admission baseline Does the patient have difficulty walking or climbing stairs?: Yes Weakness of Legs: Both Weakness of Arms/Hands: None  Permission Sought/Granted Permission sought to share information with : Family Supports Permission granted to share information with : Yes, Verbal Permission Granted  Share Information with NAME: wife susan  Permission granted to share info w AGENCY: [place]  Emotional Assessment Appearance:: Appears stated age Attitude/Demeanor/Rapport: Engaged Affect (typically observed): Appropriate, Pleasant Orientation: :  (not recorded, appears oriented today) Alcohol / Substance Use: Not Applicable Psych Involvement: No (comment)  Admission  diagnosis:  Encephalopathy [G93.40] Cellulitis of left lower extremity [L03.116] AKI (acute kidney injury) ([place]) [N17.9] Urinary tract infection with hematuria, site unspecified [N39.0, R31.9] Patient Active Problem List   Diagnosis Date Noted   Pressure injury of skin 10/23/2020   Urinary tract infection with hematuria 10/22/2020   Encephalopathy 10/22/2020   AKI (acute kidney injury) ([place])    Overweight (BMI 25.0-29.9) 08/19/2020   Vertebral fracture, osteoporotic ([place]) 08/18/2020   T8 vertebral fracture ([place]) 08/17/2020   Thrombocytopenia ([place]) 08/17/2020   CKD (chronic kidney disease) stage 3, GFR 30-59 ml/min (HCC) 08/17/2020   Elevated troponin 08/17/2020   Pain due to onychomycosis of toenails of both feet 07/27/2020   Callus of heel 07/27/2020   Bifascicular block 11/11/2019   Acute on chronic diastolic heart failure ([place]) 11/10/2019   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   Severe aortic stenosis 09/29/2019   OSA (obstructive sleep apnea) 11/18/2014   Thoracic ascending aortic aneurysm 09/29/2012   S/P laparoscopic cholecystectomy 04/09/2012   S/P CABG x 3 03/06/2012   S/P Maze operation for atrial fibrillation 03/06/2012   Paroxysmal atrial fibrillation ([place]) 02/29/2012   CAD (coronary artery disease), native coronary artery    Hyperlipidemia    Hypertension    Chronic venous insufficiency    Gout    Benign prostatic hyperplasia    PCP:  [name], MD Pharmacy:   [facility] [place], [place] AT [place] [address] Phone: [phone] Fax: [phone]     Social Determinants of Health (SDOH) Interventions    Readmission Risk Interventions No flowsheet data found.

## 2020-10-24 NOTE — Consult Note (Addendum)
Hospital Consult    Reason for Consult:  Abnormal ABI, worsening right foot wound Requesting Physician:  Dr. Eliseo Squires MRN #:  [id number]  History of Present Illness: This is a 87 y.o. male with medical history significant for CKD stage II, Hypertension, CHF, hyperlipidemia, Obesity, chronic urethral obstruction with indwelling foley catheter, AS s/p TAVR, CAD with hx of CABG and PAD with non healing right foot wound. He explains that he has had a right heel wound for approximately 2 months. He has been going to the wound care center and family also has additionally be doing local wound care. They report that he more recently developed a larger ulcer on the medial posterior aspect of the right heel. He additionally has a skin tear to the left leg from recent trauma. He reports that he is able to ambulate but it is very limited secondary to weakness of his legs/ generalized weakness. He denies any pain on ambulation. He does have pain in the right foot/ heel mostly on weight bearing. He does not have pain that wakes him up from sleep. He does have swelling in both legs that has been present for several years. He elevates in a recliner but has swelling despite elevation. He wears compression stockings almost daily. On prior non invasive studies in January of 2021 he had biphasic flow throughout both legs with ABI of .59 on the right and .85 on the left. No flow limiting stenosis was seen on arterial duplex. At that time he did not have any wounds and his symptoms were felt to be more related to his polymyalgia rheumatica. He was scheduled to see Dr. Virl Cagey this Friday 10/28 in our office for evaluation of his new wounds but vascular has been asked to see him in consultation during this admission for further evaluation.  Past Medical History:  Diagnosis Date   AAA (abdominal aortic aneurysm)    Acute coronary syndrome (HCC) 04/09/8117   Acute systolic heart failure ([place]) 03/03/2012   Angina    Arthritis     Ascending aorta dilatation ([place]) 03/13/2012   Fusiform dilatation of the ascending thoracic aorta discovered during surgery, max diameter 4.2-4.3 cm   Atrial fibrillation (HCC) 02/29/2012   Recurrent paroxysmal, new-onset   BPH (benign prostatic hypertrophy)    CAD (coronary artery disease)    Cath April 2012 40% left main, 50% LAD, 40% OM, occluded RCA with L-R collaterals    CHF (congestive heart failure) (HCC)    Chronic venous insufficiency    Dyspnea    GERD (gastroesophageal reflux disease)    Glaucoma    Gout    Headache    MIGRAINES in the past   History of kidney stones    Hyperlipidemia    Hypertension    MI (myocardial infarction) ([place])    Obesity (BMI 30-39.9)    Paroxysmal atrial fibrillation (HCC) 02/29/2012   Recurrent paroxysmal, new-onset    PMR (polymyalgia rheumatica) (HCC)    Pre-diabetes    S/P CABG x 3 03/06/2012   LIMA to LAD, SVG to OM, SVG to RCA, EVH via right thigh   S/P Maze operation for atrial fibrillation 03/06/2012   Complete bilateral lesions set using bipolar radiofrequency and cryothermy ablation with clipping of LA appendage   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   s/p TAVR with a 26 mm Edwards via the left subclavian by Drs Buena Irish and Bartle   Severe aortic stenosis 09/29/2019   Sleep apnea    USES CPAP NIGHTLY  Past Surgical History:  Procedure Laterality Date   CARDIAC CATHETERIZATION     CATARACT EXTRACTION, BILATERAL     with lens implants   CHOLECYSTECTOMY N/A 03/24/2012   Procedure: LAPAROSCOPIC CHOLECYSTECTOMY WITH INTRAOPERATIVE CHOLANGIOGRAM;  Surgeon: Zenovia Jarred, MD;  Location: [facility];  Service: General;  Laterality: N/A;   CORONARY ARTERY BYPASS GRAFT N/A 03/06/2012   Procedure: CORONARY ARTERY BYPASS GRAFTING (CABG);  Surgeon: Rexene Alberts, MD;  Location: [facility];  Service: Open Heart Surgery;  Laterality: N/A;   ENDOVEIN HARVEST OF GREATER SAPHENOUS VEIN Right 03/06/2012   Procedure: ENDOVEIN HARVEST  OF GREATER SAPHENOUS VEIN;  Surgeon: Rexene Alberts, MD;  Location: [facility];  Service: Open Heart Surgery;  Laterality: Right;   INTRAOPERATIVE TRANSESOPHAGEAL ECHOCARDIOGRAM N/A 03/06/2012   Procedure: INTRAOPERATIVE TRANSESOPHAGEAL ECHOCARDIOGRAM;  Surgeon: Rexene Alberts, MD;  Location: [facility];  Service: Open Heart Surgery;  Laterality: N/A;   IR KYPHO LUMBAR INC FX REDUCE BONE BX UNI/BIL CANNULATION INC/IMAGING  02/23/2020   LEFT HEART CATH Right 03/02/2012   Procedure: LEFT HEART CATH;  Surgeon: Sherren Mocha, MD;  Location: [facility] CATH LAB;  Service: Cardiovascular;  Laterality: Right;   MAZE N/A 03/06/2012   Procedure: MAZE;  Surgeon: Rexene Alberts, MD;  Location: [place];  Service: Open Heart Surgery;  Laterality: N/A;   PROSTATECTOMY     partial   RIGHT/LEFT HEART CATH AND CORONARY/GRAFT ANGIOGRAPHY N/A 09/29/2019   Procedure: RIGHT/LEFT HEART CATH AND CORONARY/GRAFT ANGIOGRAPHY;  Surgeon: Adrian Prows, MD;  Location: [place] CV LAB;  Service: Cardiovascular;  Laterality: N/A;   TEE WITHOUT CARDIOVERSION N/A 11/10/2019   Procedure: TRANSESOPHAGEAL ECHOCARDIOGRAM (TEE);  Surgeon: Burnell Blanks, MD;  Location: [place];  Service: Open Heart Surgery;  Laterality: N/A;   TRANSCATHETER AORTIC VALVE REPLACEMENT, TRANSFEMORAL  11/10/2019    Allergies  Allergen Reactions   Amoxicillin Other (See Comments)    Headache, debilitated   Bee Venom Anaphylaxis   Avelox [Moxifloxacin]     Other reaction(s): Unknown   Duloxetine Hcl     Other reaction(s): felt weird   Levitra [Vardenafil]     Other reaction(s): Unknown   Morphine And Related Other (See Comments)    hypotension   Testosterone Other (See Comments)    unknown   Viagra [Sildenafil]     Other reaction(s): didn't like it    Prior to Admission medications   Medication Sig Start Date End Date Taking? Authorizing Provider  acetaminophen (TYLENOL) 500 MG tablet Take 2 tablets (1,000 mg total) by mouth 3 (three) times daily. For 3 to  5 days and then change to 1000 mg 3 times daily as needed for pain. Patient taking differently: Take 500 mg by mouth as needed for mild pain. 08/23/20  Yes Hongalgi, Lenis Dickinson, MD  amiodarone (PACERONE) 200 MG tablet Take 1 tablet (200 mg total) by mouth daily. 03/09/20  Yes Cantwell, Celeste C, PA-C  amLODipine (NORVASC) 10 MG tablet Take 1 tablet (10 mg total) by mouth daily. 07/28/20  Yes Cantwell, Celeste C, PA-C  cholecalciferol (VITAMIN D3) 25 MCG (1000 UNIT) tablet Take 1,000 Units by mouth daily.    Yes [provider]  cyclobenzaprine (FLEXERIL) 10 MG tablet Take 10 mg by mouth as needed for muscle spasms.   Yes [provider]  EPINEPHrine 0.3 mg/0.3 mL IJ SOAJ injection Inject 0.3 mLs (0.3 mg total) into the muscle as needed for anaphylaxis. Patient taking differently: Inject 0.3 mg into the muscle once as needed  for anaphylaxis. 08/08/18  Yes Ambs, Kathrine Cords, FNP  ezetimibe (ZETIA) 10 MG tablet Take 10 mg by mouth daily.   Yes [provider]  finasteride (PROSCAR) 5 MG tablet Take 5 mg by mouth daily. 09/15/20  Yes [provider]  fish oil-omega-3 fatty acids 1000 MG capsule Take 1 g by mouth daily.    Yes [provider]  fluticasone (FLONASE) 50 MCG/ACT nasal spray Place 1 spray into both nostrils daily as needed for allergies.   Yes [provider]  gabapentin (NEURONTIN) 100 MG capsule Take 100 mg by mouth 3 (three) times daily.   Yes [provider]  galantamine (RAZADYNE ER) 8 MG 24 hr capsule Take 8 mg by mouth Daily. 02/26/11  Yes [provider]  HONEY BEE VENOM IJ Inject 2 each as directed See admin instructions. Every eight weeks   Yes [provider]  hydrALAZINE (APRESOLINE) 25 MG tablet Take 25 mg by mouth 2 (two) times daily. 08/09/20  Yes [provider]  isosorbide mononitrate (IMDUR) 60 MG 24 hr tablet TAKE 1 TABLET(60 MG) BY MOUTH DAILY AFTER SUPPER Patient taking differently: Take 60 mg by  mouth daily. 10/10/20  Yes Adrian Prows, MD  leflunomide (ARAVA) 10 MG tablet Take 10 mg by mouth daily. 08/24/20  Yes [provider]  magnesium gluconate (MAGONATE) 500 MG tablet Take 500 mg by mouth daily.   Yes [provider]  memantine (NAMENDA) 10 MG tablet Take 10 mg by mouth 2 (two) times daily. 09/08/19  Yes [provider]  metoprolol succinate (TOPROL-XL) 25 MG 24 hr tablet Take 1 tablet (25 mg total) by mouth daily as needed (for high heart rate, consistently greater than 110/min.). 08/23/20  Yes Hongalgi, Lenis Dickinson, MD  nitroGLYCERIN (NITROSTAT) 0.4 MG SL tablet Place 1 tablet (0.4 mg total) under the tongue every 5 (five) minutes x 3 doses as needed for chest pain. 12/16/19  Yes Cantwell, Celeste C, PA-C  pantoprazole (PROTONIX) 40 MG tablet Take 40 mg by mouth every other day.  10/28/14  Yes [provider]  potassium chloride (MICRO-K) 10 MEQ CR capsule TAKE ONE CAPSULE BY MOUTH DAILY WITH LASIX Patient taking differently: Take 10 mEq by mouth daily. 10/03/20  Yes Cantwell, Celeste C, PA-C  predniSONE (DELTASONE) 1 MG tablet Take 8 mg by mouth daily with breakfast.   Yes [provider]  [place] 1 each into the lungs at bedtime. Cpap   Yes [provider]  Psyllium Husk POWD Take 1 Scoop by mouth daily.    Yes [provider]  Rivaroxaban (XARELTO) 15 MG TABS tablet Take 15 mg by mouth daily with supper.   Yes [provider]  torsemide (DEMADEX) 20 MG tablet Take 2 tablets (40 mg total) by mouth daily. 09/16/20  Yes Cantwell, Celeste C, PA-C  triamcinolone cream (KENALOG) 0.1 % Apply 1 application topically daily as needed (skin irritation).   Yes [provider]  vitamin B-12 (CYANOCOBALAMIN) 1000 MCG tablet Take 1,000 mcg by mouth daily.    Yes [provider]  docusate sodium (COLACE) 100 MG capsule Take 1 capsule (100 mg total) by mouth 2 (two) times daily. Patient not taking:  No sig reported 08/23/20   Modena Jansky, MD  oxyCODONE (OXY IR/ROXICODONE) 5 MG immediate release tablet Take 0.5 tablets (2.5 mg total) by mouth every 12 (twelve) hours as needed for breakthrough pain. Patient not taking: No sig reported 08/23/20   Modena Jansky,  MD  polyethylene glycol (MIRALAX / GLYCOLAX) 17 g packet Take 17 g by mouth 2 (two) times daily. Patient not taking: No sig reported 08/23/20   Modena Jansky, MD  tamsulosin (FLOMAX) 0.4 MG CAPS capsule Take 1 capsule (0.4 mg total) by mouth daily after supper. Patient not taking: No sig reported 08/23/20   Modena Jansky, MD    Social History   Socioeconomic History   Marital status: Married    Spouse name: Not on file   Number of children: 4   Years of education: Not on file   Highest education level: Not on file  Occupational History   Occupation: retired    Comment: [employer]  Tobacco Use   Smoking status: Former    Packs/day: 0.50    Years: 15.00    Pack years: 7.50    Types: Cigarettes    Quit date: 11/18/1962    Years since quitting: 57.9   Smokeless tobacco: Never  Vaping Use   Vaping Use: Never used  Substance and Sexual Activity   Alcohol use: Yes    Alcohol/week: 2.0 standard drinks    Types: 2 Shots of liquor per week    Comment: daily   Drug use: Never   Sexual activity: Yes  Other Topics Concern   Not on file  Social History Narrative   Lives with wife in [place].     Very active physically without significant limitations.    Exercises regularly   Social Determinants of Health   Financial Resource Strain: Not on file  Food Insecurity: Not on file  Transportation Needs: Not on file  Physical Activity: Not on file  Stress: Not on file  Social Connections: Not on file  Intimate Partner Violence: Not on file     Family History  Problem Relation Age of Onset   Dementia Mother    Stroke Brother     ROS: Otherwise negative unless mentioned in HPI  Physical  Examination  Vitals:   10/23/20 2000 10/24/20 0500  BP: 124/87 114/78  Pulse: 97 78  Resp:  16  Temp: 98.3 F (36.8 C) 98.6 F (37 C)  SpO2: 97% 98%   Body mass index is 27.71 kg/m.  General:  WDWN in NAD Gait: Not observed HENT: WNL, normocephalic Pulmonary: normal non-labored breathing Cardiac: regular Abdomen:  soft, NT/ND, no masses Vascular Exam/Pulses: 2 + femoral pulses bilaterally, 1+ right PT pulse, 2+ left PT pulse. Bilateral lower extremities edematous. Chronic venous stasis changes to the left > right lower extremity. Abrasion of left anterior leg dressed. Extremities: without ischemic changes, without Gangrene , without cellulitis; with open wounds of the right heel  Right lateral heel  Right medial posterior heel Musculoskeletal: no muscle wasting or atrophy  Neurologic: A&O X 3;  No focal weakness or paresthesias are detected; speech is fluent/normal Psychiatric:  The pt has Normal affect.  CBC    Component Value Date/Time   WBC 7.2 10/24/2020 0231   RBC 2.80 (L) 10/24/2020 0231   HGB 8.4 (L) 10/24/2020 0231   HGB 10.8 (L) 09/23/2019 1341   HCT 26.8 (L) 10/24/2020 0231   HCT 32.9 (L) 09/23/2019 1341   PLT 111 (L) 10/24/2020 0231   PLT 199 09/23/2019 1341   MCV 95.7 10/24/2020 0231   MCV 95 09/23/2019 1341   MCH 30.0 10/24/2020 0231   MCHC 31.3 10/24/2020 0231   RDW 15.3 10/24/2020 0231   RDW 13.2 09/23/2019 1341   LYMPHSABS 0.7 10/23/2020 0241  MONOABS 0.6 10/23/2020 0241   EOSABS 0.0 10/23/2020 0241   BASOSABS 0.0 10/23/2020 0241    BMET    Component Value Date/Time   NA 138 10/24/2020 0231   NA 136 06/16/2020 1607   K 3.3 (L) 10/24/2020 0231   CL 109 10/24/2020 0231   CO2 23 10/24/2020 0231   GLUCOSE 98 10/24/2020 0231   BUN 18 10/24/2020 0231   BUN 38 (H) 06/16/2020 1607   CREATININE 1.32 (H) 10/24/2020 0231   CREATININE 1.1 04/22/2015 1029   CREATININE 1.40 (H) 03/30/2014 1157   CALCIUM 8.1 (L) 10/24/2020 0231   GFRNONAA 51 (L)  10/24/2020 0231   GFRAA 38 (L) 12/18/2019 1124    COAGS: Lab Results  Component Value Date   INR 2.1 (H) 10/22/2020   INR 1.0 11/06/2019   INR 1.2 10/22/2019     Non-Invasive Vascular Imaging:   +-------+-----------+-----------+------------+------------+  ABI/TBIToday's ABIToday's TBIPrevious ABIPrevious TBI  +-------+-----------+-----------+------------+------------+  Right  0.67       0.44       0.59        0.40          +-------+-----------+-----------+------------+------------+  Left   1.21       0.53       0.85        0.58          +-------+-----------+-----------+------------+------------+   VAS [place] Lower Extremity Arterial Duplex Bilateral: +-----------+--------+-----+--------+--------+--------+  RIGHT      PSV cm/sRatioStenosisWaveformComments  +-----------+--------+-----+--------+--------+--------+  CFA Mid    126                  biphasic          +-----------+--------+-----+--------+--------+--------+  DFA        91                   biphasic          +-----------+--------+-----+--------+--------+--------+  SFA Prox   108                  biphasic          +-----------+--------+-----+--------+--------+--------+  SFA Mid    93                   biphasic          +-----------+--------+-----+--------+--------+--------+  SFA Distal 72                   biphasic          +-----------+--------+-----+--------+--------+--------+  POP Prox   69                   biphasic          +-----------+--------+-----+--------+--------+--------+  ATA Distal 46                   biphasic          +-----------+--------+-----+--------+--------+--------+  PTA Distal 50                   biphasic          +-----------+--------+-----+--------+--------+--------+  PERO Distal62                   biphasic          +-----------+--------+-----+--------+--------+--------+     +-----------+--------+-----+--------+--------+--------------+  LEFT       PSV cm/sRatioStenosisWaveformComments        +-----------+--------+-----+--------+--------+--------------+  CFA Mid    118  biphasic                +-----------+--------+-----+--------+--------+--------------+  DFA        143                  biphasic                +-----------+--------+-----+--------+--------+--------------+  SFA Prox   138                  biphasic                +-----------+--------+-----+--------+--------+--------------+  SFA Mid    92                   biphasic                +-----------+--------+-----+--------+--------+--------------+  SFA Distal 142                  biphasic                +-----------+--------+-----+--------+--------+--------------+  POP Prox   65                   biphasic                +-----------+--------+-----+--------+--------+--------------+  ATA Distal 35                   biphasic                +-----------+--------+-----+--------+--------+--------------+  PTA Distal 82                   biphasic                +-----------+--------+-----+--------+--------+--------------+  PERO Distal                             not visualized  +-----------+--------+-----+--------+--------+--------------+   Summary:  Right: Diffuse atherosclerotic disease observed throughout the lower extremity arterial system without hemodynamically significant stenosis.   Left: Diffuse atherosclerotic disease observed throughout the lower extremity arterial system without hemodynamically significant stenosis.  Statin:  No. Beta Blocker:  Yes.   Aspirin:  No. ACEI:  No. ARB:  No. CCB use:  Yes Other antiplatelets/anticoagulants:  Yes.   Xarelto   ASSESSMENT/PLAN: This is a 87 y.o. male with non healing right heel wound. Wound has been present for 2 months and he now has new ulcer on the right heel.  Has been receiving wound care at wound care center. Vascular evaluation was recommended for evaluation of blood flow. His most recent non invasive arterial studies indicate biphasic flow of bilateral lower extremities. His right ABI is slightly diminished at 0.67 and left is normal at 1.21. He likely has diffuse tibial disease. Discussed with patient and his family that recommendation would be to have Angiogram with possible intervention. Discussed some risk secondary to his CKD. He has CKD stage II so he would likely need pre hydration and combination of contrast/ CO2 for the arteriogram.  - Hold Xarelto starting today - Continue local wound care - Forefoot weight bearing only and keep foot floated while in bed - On call vascular surgeon, Dr. Carlis Abbott will see patient later this afternoon and provide timing of intervention   Karoline Caldwell PA-C [facility] [phone] 10/24/2020  12:22 PM  I have seen and evaluated the patient. I agree with the PA note as documented  above.  87 year old male that vascular surgery has been consulted for a right heel wound.  Patient states this has been present for at least 2 months and has been nonhealing.  He has been minimally ambulatory recently given lower extremity weakness but otherwise is able to walk.  ABIs were diminished on the right 0.67 with a toe pressure 64.  Discussed this is likely marginal for wound healing.  Given the chronicity of the wound and the fact that it has been nonhealing for 2 months, I think he would benefit from aortogram with lower extremity arteriogram and possible intervention.  I have posted him for Wednesday with my partner Dr. Virl Cagey in the cath lab.  Please hold his Xarelto.  This is for atrial fibrillation.  Risk benefits discussed.  We will try to use CO2 as much as possible to minimize contrast given his underlying chronic kidney disease.  Marty Heck, MD [facility]  [place] Office: [phone]

## 2020-10-24 NOTE — Progress Notes (Signed)
Progress Note    [name] [name]  TIR:[id number] DOB: [birth year]  DOA: 10/22/2020 PCP: [name], MD    Brief Narrative:    Medical records reviewed and are as summarized below:  [name] [name] is an 87 y.o. male with medical history significant of BPH with chronic urethral obstruction on indwelling Foley catheter, AS status post TAVR, CAD status post CABG, HTN, CKD stage II, chronic diastolic CHF, HLD, morbid obesity, came with malaise, generalized weakness for 1 day.  Being treated for UTI-- culture pending.  Also had recent ABI that was abnormal.  Vascular consult placed due to poorly healing wounds.    Assessment/Plan:   Active Problems:   Urinary tract infection with hematuria   Encephalopathy   Pressure injury of skin    UTI complicated with indwelling Foley, with hematuria -Hematuria appears to be new, and pyuria.  Received vancomycin and cefepime in the ED - Change cefepime to ceftriaxone for now while waiting for urine culture result. -Other infection source could be LE wounds -change foley   Metabolic encephalopathy -Probably from UTI, improved   AKI on CKD stage II -hold lasix  -Cr trending down   Chronic normocytic anemia -H&H stable, denied any black tarry stool -Fe low- outpatient follow up   HTN -Discontinue amlodipine due to worsening of peripheral edema -BP low so hold all meds   Chronic A. Fib -Rate controlled, continue amiodarone  -hold xarelto for possible procedure    Chronic RA on prednisone -Double dose of steroid-wean back to 7mg  -Continue Leflunomide -Renal dosed gabapentin   BPH -Continue Proscar   Chronic right heel ulcer -Wound care -ABI was abnormal recently and family/patient describes worsening wound, will ask vascular to see here   Generalized weakness/deconditioning -PT evaluation.   Hypotension -see above  Hypokalemia -replete    Family Communication/Anticipated D/C date and plan/Code Status   DVT  prophylaxis: xarelto Code Status: DNR Disposition Plan: Status is: Inpatient  Remains inpatient appropriate because: needs IV abx         Medical Consultants:   vascular     Subjective:   Worried about his right foot wounds  Objective:    Vitals:   10/23/20 0405 10/23/20 0735 10/23/20 2000 10/24/20 0500  BP: (!) 100/56 (!) 92/57 124/87 114/78  Pulse: 70 72 97 78  Resp: 20 18  16   Temp: (!) 100.6 F (38.1 C) 98.8 F (37.1 C) 98.3 F (36.8 C) 98.6 F (37 C)  TempSrc: Oral Oral Oral Oral  SpO2: 98% 98% 97% 98%  Weight:      Height:        Intake/Output Summary (Last 24 hours) at 10/24/2020 1420 Last data filed at 10/24/2020 0600 Gross per 24 hour  Intake 1780 ml  Output 450 ml  Net 1330 ml   Filed Weights   10/22/20 1214  Weight: 95.3 kg    Exam:  General: Appearance:     Overweight male in no acute distress     Lungs:     respirations unlabored  Heart:    Normal heart rate.    MS:   All extremities are intact.  Foot wrapped and in prevalon boot   Neurologic:   Awake, alert, oriented x 3        Data Reviewed:   I have personally reviewed following labs and imaging studies:  Labs: Labs show the following:   Basic Metabolic Panel: Recent Labs  Lab 10/22/20 1217 10/23/20 0241 10/24/20 0231  NA  142 140 138  K 3.5 3.3* 3.3*  CL 109 107 109  CO2 24 23 23   GLUCOSE 131* 110* 98  BUN 29* 21 18  CREATININE 1.92* 1.69* 1.32*  CALCIUM 8.6* 8.1* 8.1*  MG  --  2.1  --    GFR Estimated Creatinine Clearance: 40.4 mL/min (A) (by C-G formula based on SCr of 1.32 mg/dL (H)). Liver Function Tests: Recent Labs  Lab 10/22/20 1217  AST 33  ALT 37  ALKPHOS 90  BILITOT 0.6  PROT 5.8*  ALBUMIN 2.9*   No results for input(s): LIPASE, AMYLASE in the last 168 hours. No results for input(s): AMMONIA in the last 168 hours. Coagulation profile Recent Labs  Lab 10/22/20 1217  INR 2.1*    CBC: Recent Labs  Lab 10/22/20 1217 10/23/20 0241  10/24/20 0231  WBC 8.9 8.1 7.2  NEUTROABS 7.8* 6.6  --   HGB 8.4* 8.0* 8.4*  HCT 27.0* 24.8* 26.8*  MCV 98.9 95.8 95.7  PLT 123* 112* 111*   Cardiac Enzymes: Recent Labs  Lab 10/22/20 1856  CKTOTAL 52   BNP (last 3 results) No results for input(s): PROBNP in the last 8760 hours. CBG: No results for input(s): GLUCAP in the last 168 hours. D-Dimer: No results for input(s): DDIMER in the last 72 hours. Hgb A1c: No results for input(s): HGBA1C in the last 72 hours. Lipid Profile: No results for input(s): CHOL, HDL, LDLCALC, TRIG, CHOLHDL, LDLDIRECT in the last 72 hours. Thyroid function studies: No results for input(s): TSH, T4TOTAL, T3FREE, THYROIDAB in the last 72 hours.  Invalid input(s): FREET3 Anemia work up: Recent Labs    10/22/20 1856  FERRITIN 92  TIBC 325  IRON 13*  RETICCTPCT 1.5   Sepsis Labs: Recent Labs  Lab 10/22/20 1217 10/22/20 1417 10/23/20 0241 10/24/20 0231  WBC 8.9  --  8.1 7.2  LATICACIDVEN 1.9 1.3  --   --     Microbiology Recent Results (from the past 240 hour(s))  Urine Culture     Status: None (Preliminary result)   Collection Time: 10/22/20 12:17 PM   Specimen: In/Out Cath Urine  Result Value Ref Range Status   Specimen Description IN/OUT CATH URINE  Final   Special Requests Normal  Final   Culture   Final    CULTURE REINCUBATED FOR BETTER GROWTH Performed at [facility], [address], [place], [place] [address]    Report Status PENDING  Incomplete  Blood culture (routine single)     Status: None (Preliminary result)   Collection Time: 10/22/20 12:37 PM   Specimen: BLOOD  Result Value Ref Range Status   Specimen Description BLOOD LEFT ANTECUBITAL  Final   Special Requests   Final    BOTTLES DRAWN AEROBIC AND ANAEROBIC Blood Culture results may not be optimal due to an inadequate volume of blood received in culture bottles   Culture   Final    NO GROWTH 2 DAYS Performed at [facility], [place] [address],  [place],  [address]    Report Status PENDING  Incomplete  Resp Panel by RT-PCR (Flu A&B, Covid) Nasopharyngeal Swab     Status: None   Collection Time: 10/22/20  5:53 PM   Specimen: Nasopharyngeal Swab; Nasopharyngeal(NP) swabs in vial transport medium  Result Value Ref Range Status   SARS Coronavirus 2 by RT PCR NEGATIVE NEGATIVE Final    Comment: (NOTE) SARS-CoV-2 target nucleic acids are NOT DETECTED.  The SARS-CoV-2 RNA is generally detectable  in upper respiratory specimens during the acute phase of infection. The lowest concentration of SARS-CoV-2 viral copies this assay can detect is 138 copies/mL. A negative result does not preclude SARS-Cov-2 infection and should not be used as the sole basis for treatment or other patient management decisions. A negative result may occur with  improper specimen collection/handling, submission of specimen other than nasopharyngeal swab, presence of viral mutation(s) within the areas targeted by this assay, and inadequate number of viral copies(<138 copies/mL). A negative result must be combined with clinical observations, patient history, and epidemiological information. The expected result is Negative.  Fact Sheet for Patients:  EntrepreneurPulse.com.au  Fact Sheet for Healthcare Providers:  IncredibleEmployment.be  This test is no t yet approved or cleared by the [place] FDA and  has been authorized for detection and/or diagnosis of SARS-CoV-2 by FDA under an Emergency Use Authorization (EUA). This EUA will remain  in effect (meaning this test can be used) for the duration of the COVID-19 declaration under Section 564(b)(1) of the Act, 21 U.S.C.section 360bbb-3(b)(1), unless the authorization is terminated  or revoked sooner.       Influenza A by PCR NEGATIVE NEGATIVE Final   Influenza B by PCR NEGATIVE NEGATIVE Final    Comment: (NOTE) The Xpert Xpress SARS-CoV-2/FLU/RSV plus assay is  intended as an aid in the diagnosis of influenza from Nasopharyngeal swab specimens and should not be used as a sole basis for treatment. Nasal washings and aspirates are unacceptable for Xpert Xpress SARS-CoV-2/FLU/RSV testing.  Fact Sheet for Patients: EntrepreneurPulse.com.au  Fact Sheet for Healthcare Providers: IncredibleEmployment.be  This test is not yet approved or cleared by the [place] FDA and has been authorized for detection and/or diagnosis of SARS-CoV-2 by FDA under an Emergency Use Authorization (EUA). This EUA will remain in effect (meaning this test can be used) for the duration of the COVID-19 declaration under Section 564(b)(1) of the Act, 21 U.S.C. section 360bbb-3(b)(1), unless the authorization is terminated or revoked.  Performed at [facility], [place] [address], [place],  [address]     Procedures and diagnostic studies:  US RENAL  Result Date: 2020/10/23 CLINICAL DATA:  Acute kidney injury. EXAM: RENAL / URINARY TRACT ULTRASOUND COMPLETE COMPARISON:  Abdominal ultrasound 03/22/2012. Abdominal CTA 10/13/2019. FINDINGS: Right Kidney: Renal measurements: 10.2 x 6.5 x 6.6 cm = volume: 227.6 mL. Mild renal cortical thinning with prominent renal sinus fat. There are small renal cysts, measuring up to 2.9 cm in the upper pole. No hydronephrosis. Left Kidney: Renal measurements: 9.5 x 5.9 x 4.5 cm = volume: 132.8 mL. Mild renal cortical thinning with prominent renal sinus fat. The left kidney is not optimally visualized. No focal cortical abnormality or hydronephrosis identified. Bladder: Decompressed by Foley catheter. Other: Examination limited by body habitus. IMPRESSION: 1. Bilateral renal cortical thinning with decreased renal size compared with remote abdominal ultrasound, consistent with chronic medical renal disease. 2. No evidence of hydronephrosis. Electronically Signed   By: Richardean Sale M.D.   On:  10-23-2020 16:58    Medications:    amiodarone  200 mg Oral Daily   Chlorhexidine Gluconate Cloth  6 each Topical Q0600   docusate sodium  100 mg Oral BID   ezetimibe  10 mg Oral Daily   finasteride  5 mg Oral Daily   gabapentin  100 mg Oral TID   galantamine  8 mg Oral Q breakfast   hydrALAZINE  25 mg Oral BID   isosorbide mononitrate  60 mg Oral  Daily   leflunomide  10 mg Oral Daily   memantine  10 mg Oral BID   pantoprazole  40 mg Oral QODAY   predniSONE  16 mg Oral Q breakfast   psyllium  1 packet Oral Daily   Continuous Infusions:  cefTRIAXone (ROCEPHIN)  IV 2 g (10/24/20 0943)     LOS: 2 days   Geradine Girt  [facility]   How to contact the [facility] Attending or Consulting provider [place] or covering provider during after hours [place], for this patient?  Check the care team in [facility] and look for a) attending/consulting TRH provider listed and b) the [facility] team listed Log into [email or web address] and use [place]'s universal password to access. If you do not have the password, please contact the hospital operator. Locate the [facility] provider you are looking for under [facility] and page to a number that you can be directly reached. If you still have difficulty reaching the provider, please page the [facility] (Director on Call) for the Hospitalists listed on amion for assistance.  10/24/2020, 2:20 PM

## 2020-10-25 DIAGNOSIS — N390 Urinary tract infection, site not specified: Secondary | ICD-10-CM | POA: Diagnosis not present

## 2020-10-25 DIAGNOSIS — R936 Abnormal findings on diagnostic imaging of limbs: Secondary | ICD-10-CM

## 2020-10-25 DIAGNOSIS — R319 Hematuria, unspecified: Secondary | ICD-10-CM | POA: Diagnosis not present

## 2020-10-25 DIAGNOSIS — N179 Acute kidney failure, unspecified: Secondary | ICD-10-CM | POA: Diagnosis not present

## 2020-10-25 DIAGNOSIS — S81801A Unspecified open wound, right lower leg, initial encounter: Secondary | ICD-10-CM

## 2020-10-25 DIAGNOSIS — I70291 Other atherosclerosis of native arteries of extremities, right leg: Secondary | ICD-10-CM

## 2020-10-25 DIAGNOSIS — G9340 Encephalopathy, unspecified: Secondary | ICD-10-CM | POA: Diagnosis not present

## 2020-10-25 LAB — BASIC METABOLIC PANEL
Anion gap: 7 (ref 5–15)
BUN: 19 mg/dL (ref 8–23)
CO2: 21 mmol/L — ABNORMAL LOW (ref 22–32)
Calcium: 8.2 mg/dL — ABNORMAL LOW (ref 8.9–10.3)
Chloride: 109 mmol/L (ref 98–111)
Creatinine, Ser: 1.21 mg/dL (ref 0.61–1.24)
GFR, Estimated: 56 mL/min — ABNORMAL LOW (ref >60)
Glucose, Bld: 108 mg/dL — ABNORMAL HIGH (ref 70–99)
Potassium: 4.2 mmol/L (ref 3.5–5.1)
Sodium: 137 mmol/L (ref 135–145)

## 2020-10-25 MED ORDER — SODIUM CHLORIDE 0.9 % IV SOLN
1.0000 g | Freq: Two times a day (BID) | INTRAVENOUS | Status: DC
  Administered 2020-10-25 – 2020-10-27 (×4): 1 g via INTRAVENOUS
  Filled 2020-10-25 (×7): qty 1

## 2020-10-25 NOTE — Progress Notes (Signed)
Physical Therapy Treatment Patient Details Name: [name] [name] MRN: [id number] DOB: 10-17-1926 Today's Date: 10/25/2020   History of Present Illness 87 y.o. male admitted 10/22/2020 with weakness and malaise with UTI. PMHx: BPH with chronic urethral obstruction on indwelling Foley catheter, AS status post TAVR, CAD status post CABG, HTN, CKD stage II, chronic diastolic CHF, HLD, morbid obesity.    PT Comments    Pt pleasant and reporting having not been OOB yesterday. Pt with slowly improving gait distance but pt almost over estimated his abilities as he clearly struggled with fatigue end of gait with 2 standing rest breaks. Pt states he was very active prior to fall with back fx and has had LLE weakness and decreased mobility since. Pt educated for being OOB and performing HEP throughout the day to maximize function.   SpO2 97% on RA HR 114 with gait    Recommendations for follow up therapy are one component of a multi-disciplinary discharge planning process, led by the attending physician.  Recommendations may be updated based on patient status, additional functional criteria and insurance authorization.  Follow Up Recommendations  Home health PT     Assistance Recommended at Discharge Intermittent Supervision/Assistance  Equipment Recommendations  Other (comment) (transport chair)    Recommendations for Other Services       Precautions / Restrictions Precautions Precautions: Fall Precaution Comments: R heel ulcer     Mobility  Bed Mobility Overal bed mobility: Needs Assistance Bed Mobility: Supine to Sit     Supine to sit: HOB elevated;Min assist     General bed mobility comments: pt with initial roll toward right with use of rail, HOB 15 degrees and min assist with increased time to rise from surface    Transfers Overall transfer level: Needs assistance   Transfers: Sit to/from Stand Sit to Stand: Min guard;From elevated surface           General transfer  comment: increased bed height to that of home    Ambulation/Gait Ambulation/Gait assistance: Min guard Gait Distance (Feet): 45 Feet Assistive device: Rolling walker (2 wheels) Gait Pattern/deviations: Step-through pattern;Decreased stride length;Trunk flexed   Gait velocity interpretation: <1.31 ft/sec, indicative of household ambulator General Gait Details: pt with decreased speed with progression of fatigue and struggling to complete last 15' with pt self directing distance. Cues for posture and position in RW with pt with increased effort and struggle to turn RW since he normally uses rollator   Stairs             Wheelchair Mobility    Modified Rankin (Stroke Patients Only)       Balance Overall balance assessment: Needs assistance Sitting-balance support: No upper extremity supported;Feet supported Sitting balance-Leahy Scale: Fair Sitting balance - Comments: EOb without support   Standing balance support: Bilateral upper extremity supported Standing balance-Leahy Scale: Poor Standing balance comment: RW in standing                            Cognition Arousal/Alertness: Awake/alert Behavior During Therapy: WFL for tasks assessed/performed Overall Cognitive Status: Within Functional Limits for tasks assessed                                          Exercises General Exercises - Lower Extremity Long Arc Quad: AROM;Both;Seated;15 reps Hip Flexion/Marching: AROM;Both;Seated;15 reps Toe Raises: 15 reps;Both;Seated  Heel Raises: PROM;15 reps;Both;Seated    General Comments        Pertinent Vitals/Pain Faces Pain Scale: Hurts little more Pain Location: back Pain Descriptors / Indicators: Aching Pain Intervention(s): Limited activity within patient's tolerance;Repositioned;Monitored during session    Home Living                          Prior Function            PT Goals (current goals can now be found in the  care plan section) Progress towards PT goals: Progressing toward goals    Frequency    Min 3X/week      PT Plan Current plan remains appropriate    Co-evaluation              AM-PAC PT "6 Clicks" Mobility   Outcome Measure  Help needed turning from your back to your side while in a flat bed without using bedrails?: A Little Help needed moving from lying on your back to sitting on the side of a flat bed without using bedrails?: A Little Help needed moving to and from a bed to a chair (including a wheelchair)?: A Little Help needed standing up from a chair using your arms (e.g., wheelchair or bedside chair)?: A Little Help needed to walk in hospital room?: A Little Help needed climbing 3-5 steps with a railing? : A Lot 6 Click Score: 17    End of Session Equipment Utilized During Treatment: Gait belt Activity Tolerance: Patient limited by fatigue Patient left: in chair;with call bell/phone within reach;with chair alarm set Nurse Communication: Mobility status PT Visit Diagnosis: Other abnormalities of gait and mobility (R26.89);Muscle weakness (generalized) (M62.81);Pain     Time: 1233-1300 PT Time Calculation (min) (ACUTE ONLY): 27 min  Charges:  $Gait Training: 8-22 mins $Therapeutic Exercise: 8-22 mins                     [place], PT Acute Rehabilitation Services Pager: [phone] Office: [place] 10/25/2020, 1:21 PM

## 2020-10-25 NOTE — Care Management Important Message (Signed)
Important Message  Patient Details  Name: MACKINLEY KIEHN MRN: [id number] Date of Birth: 1926-09-30   Medicare Important Message Given:  Yes     Cadarius Nevares 10/25/2020, 3:45 PM

## 2020-10-25 NOTE — Progress Notes (Addendum)
    Non healing right LE wounds with PAD and abnormal ABI's Plan for aortogram 10/26/20 NPO past MN. Xarelto for Afib management on hold   [employer] PA-C  I have seen and evaluated the patient. I agree with the PA note as documented above.  Plan aortogram with lower extremity arteriogram tomorrow with Dr. Virl Cagey.  Please continue to hold Xarelto.  Risk benefits discussed.  N.p.o. after midnight.  Consent order placed in chart.  Nonhealing right foot wound.  Marty Heck, MD [facility] Office: [phone]

## 2020-10-25 NOTE — Plan of Care (Signed)

## 2020-10-25 NOTE — Plan of Care (Signed)

## 2020-10-25 NOTE — Progress Notes (Signed)
  [place] VALVE TEAM   Patient due for one year post TAVR follow up. The patient was seen during his current admission. Patient has NYHA class II symptoms. One year echocardiogram currently scheduled for next week however due to his current hospitalization, he wishes to move this to December. Will place updated echo schedule in the patients AVS.   [place]  KCCQ-12 10/25/2020 12/17/2019 10/20/2019  1 a. Ability to shower/bathe Not at all limited Not at all limited Not at all limited  1 b. Ability to walk 1 block Other, Did not do Slightly limited Quite a bit limited  1 c. Ability to hurry/jog Other, Did not do Other, Did not do Extremely limited  2. Edema feet/ankles/legs 1-2 times a week Every morning Every morning  3. Limited by fatigue Less than once a week 1-2 times a week At least once a day  4. Limited by dyspnea Never over the past 2 weeks 1-2 times a week Several times a day  5. Sitting up / on 3+ pillows Never over the past 2 weeks Never over the past 2 weeks Never over the past 2 weeks  6. Limited enjoyment of life Not limited at all Not limited at all Limited quite a bit  7. Rest of life w/ symptoms Mostly satisfied Completely satisfied Mostly dissatisfied  8 a. Participation in hobbies Did not limit at all Did not limit at all Limited quite a bit  8 b. Participation in chores Did not limit at all Did not limit at all Limited quite a bit  8 c. Visiting family/friends Did not limit at all Did not limit at all Limited quite a bit    Kathyrn Drown NP-C Structural Heart Team  Pager: [phone]

## 2020-10-25 NOTE — Progress Notes (Addendum)
Progress Note    [name] [name]  VZD:[id number] DOB: 1926/07/02  DOA: 10/22/2020 PCP: [name], MD    Brief Narrative:    Medical records reviewed and are as summarized below:  [name] [name] is an 87 y.o. male with medical history significant of BPH with chronic urethral obstruction on indwelling Foley catheter, AS status post TAVR, CAD status post CABG, HTN, CKD stage II, chronic diastolic CHF, HLD, morbid obesity, came with malaise, generalized weakness for 1 day.  Being treated for UTI-- culture pending.  Also had recent ABI that was abnormal.  Vascular consult placed due to poorly healing wounds.     Assessment/Plan:   Active Problems:   Urinary tract infection with hematuria   Encephalopathy   Pressure injury of skin    UTI complicated with indwelling Foley, with hematuria -Hematuria appears to be new, and pyuria.  Received vancomycin and cefepime in the ED - Change cefepime due to pseudomonas on culture -change foley   Metabolic encephalopathy -Probably from UTI, improved   AKI on CKD stage II -hold lasix  -Cr trending down -monitor after angiogram   Chronic normocytic anemia -H&H stable, denied any black tarry stool -Fe low- outpatient follow up   HTN -Discontinue amlodipine due to worsening of peripheral edema -BP low so hold all meds   Chronic A. Fib -Rate controlled, continue amiodarone  -hold xarelto for possible procedure    Chronic RA on prednisone -Double dose of steroid-wean back to 7mg  -Continue Leflunomide -Renal dosed gabapentin   BPH -Continue Proscar   Chronic right heel ulcer -Wound care -ABI was abnormal recently and family/patient describes worsening wound -vascular plans for angiogram in AM   Generalized weakness/deconditioning -PT evaluation.   Hypotension -see above  Hypokalemia -replete    Family Communication/Anticipated D/C date and plan/Code Status   DVT prophylaxis: xarelto on  hold for procedure  Code  Status: DNR Disposition Plan: Status is: Inpatient  Remains inpatient appropriate because: needs IV abx         Medical Consultants:   vascular     Subjective:   NO SOB  Objective:    Vitals:   10/25/20 0501 10/25/20 0834 10/25/20 1223 10/25/20 1315  BP: (!) 143/72 (!) 142/67 124/72   Pulse:  93 82 (!) 114  Resp:      Temp: 97.8 F (36.6 C) 97.6 F (36.4 C) (!) 97.3 F (36.3 C)   TempSrc: Oral Oral Oral   SpO2: 97% 98% 96% 97%  Weight:      Height:        Intake/Output Summary (Last 24 hours) at 10/25/2020 1318 Last data filed at 10/25/2020 2951 Gross per 24 hour  Intake 476 ml  Output 1550 ml  Net -1074 ml   Filed Weights   10/22/20 1214  Weight: 95.3 kg    Exam:   General: Appearance:     Overweight male in no acute distress     Lungs:     respirations unlabored  Heart:    Tachycardic.    MS:   All extremities are intact.    Neurologic:   Awake, alert         Data Reviewed:   I have personally reviewed following labs and imaging studies:  Labs: Labs show the following:   Basic Metabolic Panel: Recent Labs  Lab 10/22/20 1217 10/23/20 0241 10/24/20 0231 10/25/20 0119  NA 142 140 138 137  K 3.5 3.3* 3.3* 4.2  CL 109 107 109  109  CO2 24 23 23  21*  GLUCOSE 131* 110* 98 108*  BUN 29* 21 18 19   CREATININE 1.92* 1.69* 1.32* 1.21  CALCIUM 8.6* 8.1* 8.1* 8.2*  MG  --  2.1  --   --    GFR Estimated Creatinine Clearance: 44 mL/min (by C-G formula based on SCr of 1.21 mg/dL). Liver Function Tests: Recent Labs  Lab 10/22/20 1217  AST 33  ALT 37  ALKPHOS 90  BILITOT 0.6  PROT 5.8*  ALBUMIN 2.9*   No results for input(s): LIPASE, AMYLASE in the last 168 hours. No results for input(s): AMMONIA in the last 168 hours. Coagulation profile Recent Labs  Lab 10/22/20 1217  INR 2.1*    CBC: Recent Labs  Lab 10/22/20 1217 10/23/20 0241 10/24/20 0231  WBC 8.9 8.1 7.2  NEUTROABS 7.8* 6.6  --   HGB 8.4* 8.0* 8.4*  HCT  27.0* 24.8* 26.8*  MCV 98.9 95.8 95.7  PLT 123* 112* 111*   Cardiac Enzymes: Recent Labs  Lab 10/22/20 1856  CKTOTAL 52   BNP (last 3 results) No results for input(s): PROBNP in the last 8760 hours. CBG: No results for input(s): GLUCAP in the last 168 hours. D-Dimer: No results for input(s): DDIMER in the last 72 hours. Hgb A1c: No results for input(s): HGBA1C in the last 72 hours. Lipid Profile: No results for input(s): CHOL, HDL, LDLCALC, TRIG, CHOLHDL, LDLDIRECT in the last 72 hours. Thyroid function studies: No results for input(s): TSH, T4TOTAL, T3FREE, THYROIDAB in the last 72 hours.  Invalid input(s): FREET3 Anemia work up: Recent Labs    10/22/20 1856  FERRITIN 92  TIBC 325  IRON 13*  RETICCTPCT 1.5   Sepsis Labs: Recent Labs  Lab 10/22/20 1217 10/22/20 1417 10/23/20 0241 10/24/20 0231  WBC 8.9  --  8.1 7.2  LATICACIDVEN 1.9 1.3  --   --     Microbiology Recent Results (from the past 240 hour(s))  Urine Culture     Status: Abnormal (Preliminary result)   Collection Time: 10/22/20 12:17 PM   Specimen: In/Out Cath Urine  Result Value Ref Range Status   Specimen Description IN/OUT CATH URINE  Final   Special Requests Normal  Final   Culture (A)  Final    >=100,000 COLONIES/mL GRAM NEGATIVE RODS >=100,000 COLONIES/mL PSEUDOMONAS AERUGINOSA SUSCEPTIBILITIES TO FOLLOW Performed at [facility], [address] [place], [place] [address]    Report Status PENDING  Incomplete  Blood culture (routine single)     Status: None (Preliminary result)   Collection Time: 10/22/20 12:37 PM   Specimen: BLOOD  Result Value Ref Range Status   Specimen Description BLOOD LEFT ANTECUBITAL  Final   Special Requests   Final    BOTTLES DRAWN AEROBIC AND ANAEROBIC Blood Culture results may not be optimal due to an inadequate volume of blood received in culture bottles   Culture   Final    NO GROWTH 3 DAYS Performed at [facility], [place] [name],  [place], [place] [address]    Report Status PENDING  Incomplete  Resp Panel by RT-PCR (Flu A&B, Covid) Nasopharyngeal Swab     Status: None   Collection Time: 10/22/20  5:53 PM   Specimen: Nasopharyngeal Swab; Nasopharyngeal(NP) swabs in vial transport medium  Result Value Ref Range Status   SARS Coronavirus 2 by RT PCR NEGATIVE NEGATIVE Final    Comment: (NOTE) SARS-CoV-2 target nucleic acids are NOT DETECTED.  The SARS-CoV-2 RNA is generally  detectable in upper respiratory specimens during the acute phase of infection. The lowest concentration of SARS-CoV-2 viral copies this assay can detect is 138 copies/mL. A negative result does not preclude SARS-Cov-2 infection and should not be used as the sole basis for treatment or other patient management decisions. A negative result may occur with  improper specimen collection/handling, submission of specimen other than nasopharyngeal swab, presence of viral mutation(s) within the areas targeted by this assay, and inadequate number of viral copies(<138 copies/mL). A negative result must be combined with clinical observations, patient history, and epidemiological information. The expected result is Negative.  Fact Sheet for Patients:  EntrepreneurPulse.com.au  Fact Sheet for Healthcare Providers:  IncredibleEmployment.be  This test is no t yet approved or cleared by the [place] FDA and  has been authorized for detection and/or diagnosis of SARS-CoV-2 by FDA under an Emergency Use Authorization (EUA). This EUA will remain  in effect (meaning this test can be used) for the duration of the COVID-19 declaration under Section 564(b)(1) of the Act, 21 U.S.C.section 360bbb-3(b)(1), unless the authorization is terminated  or revoked sooner.       Influenza A by PCR NEGATIVE NEGATIVE Final   Influenza B by PCR NEGATIVE NEGATIVE Final    Comment: (NOTE) The Xpert Xpress SARS-CoV-2/FLU/RSV plus assay is  intended as an aid in the diagnosis of influenza from Nasopharyngeal swab specimens and should not be used as a sole basis for treatment. Nasal washings and aspirates are unacceptable for Xpert Xpress SARS-CoV-2/FLU/RSV testing.  Fact Sheet for Patients: EntrepreneurPulse.com.au  Fact Sheet for Healthcare Providers: IncredibleEmployment.be  This test is not yet approved or cleared by the [place] FDA and has been authorized for detection and/or diagnosis of SARS-CoV-2 by FDA under an Emergency Use Authorization (EUA). This EUA will remain in effect (meaning this test can be used) for the duration of the COVID-19 declaration under Section 564(b)(1) of the Act, 21 U.S.C. section 360bbb-3(b)(1), unless the authorization is terminated or revoked.  Performed at [facility], [place] [address], [place], [place] [address]     Procedures and diagnostic studies:  No results found.  Medications:    amiodarone  200 mg Oral Daily   Chlorhexidine Gluconate Cloth  6 each Topical Q0600   docusate sodium  100 mg Oral BID   ezetimibe  10 mg Oral Daily   finasteride  5 mg Oral Daily   gabapentin  100 mg Oral TID   galantamine  8 mg Oral Q breakfast   hydrALAZINE  25 mg Oral BID   isosorbide mononitrate  60 mg Oral Daily   leflunomide  10 mg Oral Daily   memantine  10 mg Oral BID   pantoprazole  40 mg Oral QODAY   predniSONE  16 mg Oral Q breakfast   psyllium  1 packet Oral Daily   Continuous Infusions:  cefTRIAXone (ROCEPHIN)  IV 2 g (10/25/20 0925)     LOS: 3 days   Geradine Girt  [facility]   How to contact the [facility] Attending or Consulting provider [place] or covering provider during after hours [place], for this patient?  Check the care team in [facility] and look for a) attending/consulting [facility] provider listed and b) the [facility] team listed Log into [email or web address] and use [place]'s universal password to access. If you do not have  the password, please contact the hospital operator. Locate the [facility] provider you are looking for under [facility] and page to  a number that you can be directly reached. If you still have difficulty reaching the provider, please page the [facility] (Director on Call) for the Hospitalists listed on amion for assistance.  10/25/2020, 1:18 PM

## 2020-10-26 ENCOUNTER — Encounter (HOSPITAL_BASED_OUTPATIENT_CLINIC_OR_DEPARTMENT_OTHER): Payer: Medicare Other | Admitting: Physician Assistant

## 2020-10-26 ENCOUNTER — Encounter (HOSPITAL_COMMUNITY): Payer: Self-pay | Admitting: Vascular Surgery

## 2020-10-26 ENCOUNTER — Encounter (HOSPITAL_COMMUNITY)
Admission: EM | Disposition: A | Discharge status: Home Health | Payer: Self-pay | Source: Home / Self Care | Attending: Internal Medicine

## 2020-10-26 DIAGNOSIS — I7143 Infrarenal abdominal aortic aneurysm, without rupture: Secondary | ICD-10-CM

## 2020-10-26 DIAGNOSIS — N179 Acute kidney failure, unspecified: Secondary | ICD-10-CM | POA: Diagnosis not present

## 2020-10-26 DIAGNOSIS — L97519 Non-pressure chronic ulcer of other part of right foot with unspecified severity: Secondary | ICD-10-CM

## 2020-10-26 DIAGNOSIS — I70234 Atherosclerosis of native arteries of right leg with ulceration of heel and midfoot: Secondary | ICD-10-CM

## 2020-10-26 DIAGNOSIS — G9340 Encephalopathy, unspecified: Secondary | ICD-10-CM | POA: Diagnosis not present

## 2020-10-26 DIAGNOSIS — R319 Hematuria, unspecified: Secondary | ICD-10-CM | POA: Diagnosis not present

## 2020-10-26 DIAGNOSIS — N390 Urinary tract infection, site not specified: Secondary | ICD-10-CM | POA: Diagnosis not present

## 2020-10-26 HISTORY — PX: PERIPHERAL VASCULAR INTERVENTION: CATH118257

## 2020-10-26 HISTORY — PX: ABDOMINAL AORTOGRAM W/LOWER EXTREMITY: CATH118223

## 2020-10-26 LAB — CBC
HCT: 26.1 % — ABNORMAL LOW (ref 39.0–52.0)
Hemoglobin: 8.3 g/dL — ABNORMAL LOW (ref 13.0–17.0)
MCH: 29.7 pg (ref 26.0–34.0)
MCHC: 31.8 g/dL (ref 30.0–36.0)
MCV: 93.5 fL (ref 80.0–100.0)
Platelets: 112 10*3/uL — ABNORMAL LOW (ref 150–400)
RBC: 2.79 MIL/uL — ABNORMAL LOW (ref 4.22–5.81)
RDW: 15.2 % (ref 11.5–15.5)
WBC: 4.9 10*3/uL (ref 4.0–10.5)
nRBC: 0 % (ref 0.0–0.2)

## 2020-10-26 LAB — URINE CULTURE
Culture: >=100000 — AB
Special Requests: NORMAL

## 2020-10-26 LAB — BASIC METABOLIC PANEL
Anion gap: 7 (ref 5–15)
BUN: 20 mg/dL (ref 8–23)
CO2: 20 mmol/L — ABNORMAL LOW (ref 22–32)
Calcium: 8.5 mg/dL — ABNORMAL LOW (ref 8.9–10.3)
Chloride: 110 mmol/L (ref 98–111)
Creatinine, Ser: 1.13 mg/dL (ref 0.61–1.24)
GFR, Estimated: >60 mL/min (ref >60)
Glucose, Bld: 106 mg/dL — ABNORMAL HIGH (ref 70–99)
Potassium: 4.2 mmol/L (ref 3.5–5.1)
Sodium: 137 mmol/L (ref 135–145)

## 2020-10-26 LAB — PROTIME-INR
INR: 1.1 (ref 0.8–1.2)
Prothrombin Time: 14.3 seconds (ref 11.4–15.2)

## 2020-10-26 LAB — POCT ACTIVATED CLOTTING TIME: Activated Clotting Time: 237 seconds

## 2020-10-26 SURGERY — ABDOMINAL AORTOGRAM W/LOWER EXTREMITY
Anesthesia: LOCAL | Laterality: Right

## 2020-10-26 MED ORDER — LIDOCAINE HCL (PF) 1 % IJ SOLN
INTRAMUSCULAR | Status: AC
  Filled 2020-10-26: qty 30

## 2020-10-26 MED ORDER — CLOPIDOGREL BISULFATE 300 MG PO TABS
ORAL_TABLET | ORAL | Status: DC | PRN
  Administered 2020-10-26: 300 mg via ORAL

## 2020-10-26 MED ORDER — SODIUM CHLORIDE 0.9 % IV SOLN: INTRAVENOUS | Status: DC

## 2020-10-26 MED ORDER — HEPARIN SODIUM (PORCINE) 1000 UNIT/ML IJ SOLN
INTRAMUSCULAR | Status: AC
  Filled 2020-10-26: qty 1

## 2020-10-26 MED ORDER — MIDAZOLAM HCL 2 MG/2ML IJ SOLN
INTRAMUSCULAR | Status: AC
  Filled 2020-10-26: qty 2

## 2020-10-26 MED ORDER — ASPIRIN 325 MG PO TABS
ORAL_TABLET | ORAL | Status: AC
  Filled 2020-10-26: qty 1

## 2020-10-26 MED ORDER — ONDANSETRON HCL 4 MG/2ML IJ SOLN: 4.0000 mg | Freq: Four times a day (QID) | INTRAMUSCULAR | Status: DC | PRN

## 2020-10-26 MED ORDER — SODIUM CHLORIDE 0.9% FLUSH: 3.0000 mL | INTRAVENOUS | Status: DC | PRN

## 2020-10-26 MED ORDER — LABETALOL HCL 5 MG/ML IV SOLN: 10.0000 mg | INTRAVENOUS | Status: DC | PRN

## 2020-10-26 MED ORDER — HEPARIN (PORCINE) IN NACL 1000-0.9 UT/500ML-% IV SOLN
INTRAVENOUS | Status: AC
  Filled 2020-10-26: qty 1000

## 2020-10-26 MED ORDER — ASPIRIN 325 MG PO TABS
ORAL_TABLET | ORAL | Status: DC | PRN
  Administered 2020-10-26: 325 mg via ORAL

## 2020-10-26 MED ORDER — ATORVASTATIN CALCIUM 40 MG PO TABS
40.0000 mg | ORAL_TABLET | Freq: Every day | ORAL | Status: DC
  Administered 2020-10-26 – 2020-10-27 (×2): 40 mg via ORAL
  Filled 2020-10-26 (×2): qty 1

## 2020-10-26 MED ORDER — HYDRALAZINE HCL 20 MG/ML IJ SOLN: 5.0000 mg | INTRAMUSCULAR | Status: DC | PRN

## 2020-10-26 MED ORDER — ASPIRIN EC 81 MG PO TBEC
81.0000 mg | DELAYED_RELEASE_TABLET | Freq: Every day | ORAL | Status: DC
  Administered 2020-10-27: 81 mg via ORAL
  Filled 2020-10-26: qty 1

## 2020-10-26 MED ORDER — MIDAZOLAM HCL 2 MG/2ML IJ SOLN
INTRAMUSCULAR | Status: DC | PRN
  Administered 2020-10-26 (×2): 1 mg via INTRAVENOUS

## 2020-10-26 MED ORDER — CLOPIDOGREL BISULFATE 75 MG PO TABS
75.0000 mg | ORAL_TABLET | Freq: Every day | ORAL | Status: DC
  Administered 2020-10-27: 75 mg via ORAL
  Filled 2020-10-26: qty 1

## 2020-10-26 MED ORDER — SODIUM CHLORIDE 0.9 % IV SOLN: 250.0000 mL | INTRAVENOUS | Status: DC | PRN

## 2020-10-26 MED ORDER — HEPARIN (PORCINE) IN NACL 1000-0.9 UT/500ML-% IV SOLN
INTRAVENOUS | Status: DC | PRN
  Administered 2020-10-26 (×2): 500 mL

## 2020-10-26 MED ORDER — PROTAMINE SULFATE 10 MG/ML IV SOLN
INTRAVENOUS | Status: AC
  Filled 2020-10-26: qty 5

## 2020-10-26 MED ORDER — SODIUM CHLORIDE 0.9% FLUSH
3.0000 mL | Freq: Two times a day (BID) | INTRAVENOUS | Status: DC
  Administered 2020-10-26: 3 mL via INTRAVENOUS

## 2020-10-26 MED ORDER — SODIUM CHLORIDE 0.9 % WEIGHT BASED INFUSION
1.0000 mL/kg/h | INTRAVENOUS | Status: AC
  Administered 2020-10-26: 1 mL/kg/h via INTRAVENOUS

## 2020-10-26 MED ORDER — CLOPIDOGREL BISULFATE 300 MG PO TABS
ORAL_TABLET | ORAL | Status: AC
  Filled 2020-10-26: qty 1

## 2020-10-26 MED ORDER — LIDOCAINE HCL (PF) 1 % IJ SOLN
INTRAMUSCULAR | Status: DC | PRN
  Administered 2020-10-26: 15 mL via INTRADERMAL

## 2020-10-26 MED ORDER — FENTANYL CITRATE (PF) 100 MCG/2ML IJ SOLN
INTRAMUSCULAR | Status: DC | PRN
  Administered 2020-10-26: 50 ug via INTRAVENOUS
  Administered 2020-10-26: 25 ug via INTRAVENOUS

## 2020-10-26 MED ORDER — HEPARIN SODIUM (PORCINE) 1000 UNIT/ML IJ SOLN
INTRAMUSCULAR | Status: DC | PRN
  Administered 2020-10-26: 10000 [IU] via INTRAVENOUS
  Administered 2020-10-26: 2000 [IU] via INTRAVENOUS
  Administered 2020-10-26: 3000 [IU] via INTRAVENOUS

## 2020-10-26 MED ORDER — FENTANYL CITRATE (PF) 100 MCG/2ML IJ SOLN
INTRAMUSCULAR | Status: AC
  Filled 2020-10-26: qty 2

## 2020-10-26 MED ORDER — PROTAMINE SULFATE 10 MG/ML IV SOLN
INTRAVENOUS | Status: DC | PRN
  Administered 2020-10-26: 5 mg via INTRAVENOUS
  Administered 2020-10-26: 15 mg via INTRAVENOUS

## 2020-10-26 MED ORDER — ACETAMINOPHEN 325 MG PO TABS: 650.0000 mg | ORAL_TABLET | ORAL | Status: DC | PRN

## 2020-10-26 SURGICAL SUPPLY — 32 items
BALLN COYOTE OTW 3X220X150 (BALLOONS) ×3
BALLN COYOTE OTW 3X80X150 (BALLOONS) ×3
BALLOON COYOTE OTW 3X220X150 (BALLOONS) IMPLANT
BALLOON COYOTE OTW 3X80X150 (BALLOONS) IMPLANT
BNDG HMST LF TOP THROMBIX (HEMOSTASIS) ×6
CATH CXI 2.3F 150 ST (CATHETERS) ×1 IMPLANT
CATH CXI 2.3F 65 ST (CATHETERS) ×1 IMPLANT
CATH CXI SUPP 2.6F 150 ST (CATHETERS) ×1 IMPLANT
CATH OMNI FLUSH 5F 65CM (CATHETERS) ×1 IMPLANT
CATH QUICKCROSS .035X135CM (MICROCATHETER) ×1 IMPLANT
DEVICE CLOSURE MYNXGRIP 6/7F (Vascular Products) ×1 IMPLANT
DEVICE RAD COMP TR BAND LRG (VASCULAR PRODUCTS) IMPLANT
DEVICE TORQUE .014-.018 (MISCELLANEOUS) IMPLANT
GLIDEWIRE ADV .035X260CM (WIRE) ×1 IMPLANT
GUIDEWIRE ZILIENT 6G 014 (WIRE) ×4 IMPLANT
KIT ENCORE 26 ADVANTAGE (KITS) ×1 IMPLANT
KIT MICROPUNCTURE NIT STIFF (SHEATH) ×1 IMPLANT
KIT PV (KITS) ×4 IMPLANT
MAT PREVALON FULL STRYKER (MISCELLANEOUS) ×1 IMPLANT
PATCH THROMBIX TOPICAL PLAIN (HEMOSTASIS) ×3 IMPLANT
SHEATH GLIDE SLENDER 4/5FR (SHEATH) ×1 IMPLANT
SHEATH MICROPUNCTURE PEDAL 4FR (SHEATH) ×1 IMPLANT
SHEATH PINNACLE 5F 10CM (SHEATH) ×1 IMPLANT
SHEATH PINNACLE 6F 10CM (SHEATH) ×1 IMPLANT
SHEATH PINNACLE ST 6F 65CM (SHEATH) ×1 IMPLANT
SHEATH PROBE COVER 6X72 (BAG) ×2 IMPLANT
SYR MEDRAD MARK V 150ML (SYRINGE) ×1 IMPLANT
TORQUE DEVICE .014-.018 (MISCELLANEOUS) ×6
TRANSDUCER W/STOPCOCK (MISCELLANEOUS) ×4 IMPLANT
TRAY PV CATH (CUSTOM PROCEDURE TRAY) ×4 IMPLANT
WIRE BENTSON .035X145CM (WIRE) ×1 IMPLANT
WIRE G V18X300CM (WIRE) ×2 IMPLANT

## 2020-10-26 NOTE — Progress Notes (Signed)
Patient arrived from [facility] lab to [place] patient with left groin access, level 0 at this time. Patient with rt Heel wound with dressing intact. And foam dressing to shin area. CHG completed and vital signs obtained and patient placed on monitor and CCMD verified. Call bell with in reach. Keshara Kiger, Bettina Gavia RN

## 2020-10-26 NOTE — Op Note (Signed)
Patient name: DONYELL DING MRN: [id number] DOB: 20-Jul-1926 Sex: male  10/26/2020 Pre-operative Diagnosis: Right lower extremity, [place] 5, critical limb ischemia Post-operative diagnosis:  Same Surgeon:  Broadus John, MD Procedure Performed: 1.  Ultrasound-guided micropuncture access of the left common femoral artery 2.  Aortogram 3.  Second-order cannulation, right lower extremity angiogram 4.  Posterior tibial ultrasound-guided pedal access 5.  Posterior tibial balloon angioplasty using a 3 x 220 balloon 6.  Device assisted closure-Mynx 7.  Moderate sedation time 159 minutes    Indications: Patient is a 87 year old male has been followed in the wound clinic with right heel wound.  Wound is continued to worsen over the last several weeks.  Recent ABI demonstrated referral arterial disease with monophasic waveforms in the right lower extremity.  After discussing the risks and benefits of right lower extremity angiography with possible intervention, [place] elected to proceed.  Findings:   Aortogram: Patent bilateral renal arteries, infrarenal abdominal aneurysm roughly 3.5 cm in diameter.  On the right, normal common iliac artery, normal external iliac artery, normal internal iliac artery, normal common femoral artery, normal profunda, normal superficial femoral artery, normal popliteal artery.  Ostial stenosis of the anterior tibial artery which occludes 3 cm from its ostia giving off several collaterals.  Normal tibioperoneal trunk.  The peroneal artery provides inline flow to the distal tibia, however ends in diffuse collaterals.  The posterior tibial artery occludes mid tibia and reconstitutes at the ankle from a peroneal collateral occlusion length, roughly 120 cm.  On the left, normal common iliac artery, normal external iliac artery, normal internal iliac artery.  The left leg was not imaged.   Procedure:  The patient was identified in the holding area and taken to room 8.   The patient was then placed supine on the table and prepped and draped in the usual sterile fashion.  A time out was called.  Ultrasound was used to evaluate the left common femoral artery.  It was patent .  A digital ultrasound image was acquired.  A micropuncture needle was used to access the left common femoral artery under ultrasound guidance.  An 018 wire was advanced without resistance and a micropuncture sheath was placed.  The 018 wire was removed and a benson wire was placed.  The micropuncture sheath was exchanged for a 5 french sheath.  An omniflush catheter was advanced over the wire to the level of L-1.  An abdominal angiogram was obtained.  Next, using the omniflush catheter and a benson wire, the aortic bifurcation was crossed and the catheter was placed into theright external iliac artery and right runoff was obtained.   Findings above  Intervention: The decision was made to attempt intervention on the right posterior tibial artery.  A 6 x 65cm sheath was parked in the superficial femoral artery.  A series of wires and catheters were used however the wire would not advance through the 120 cm posterior tibial artery occlusion.  The foot was prepped and the posterior tibial artery was accessed at the medial malleolus using an ultrasound-guided micropuncture needle.  An 018 wire was used to traverse the posterior tibial artery.  Using a series of wires and catheters, I was able to identify and cannulate the true lumen.  A catheter was run into the popliteal artery with angiogram via intraluminal.  At this point the posterior tibial access site was dilated to a 4/5 sheath and a 3 x 220 mm angioplasty balloon was brought into the  field and inflated along the entirety of the posterior tibial artery.  I had concerned that the distal access point was stenotic, therefore I once again became antegrade and was able to traverse the posterior tibial artery.  The 014 wire cannulated the pedal sheath and was  externalized.  The 3 x 220 cm angioplasty balloon was once again brought to the field and run in antegrade fashion for total plasty of the posterior tibial artery. Completion angiogram demonstrated excellent result with inline flow to the foot, no stenosis appreciated, no dissection flap appreciated.  The groin access was managed with a Mynx closure device, and the right pedal PT access was managed with manual pressure.  The posterior tibial artery was palpable at case completion.   Cassandria Santee, MD [facility] Office: [phone]  Pt can follow up in our office on 11/4 with our APPs for a wound check and ABI. Non-weightbearing to the heel.

## 2020-10-26 NOTE — Progress Notes (Signed)
[facility]  PROGRESS NOTE  [name] [name] YNW:[id number] DOB: December 27, 1926 DOA: 10/22/2020 PCP: [name] Infante, MD   Brief HPI:   87 year old male with medical history of BPH with chronic ureteral obstruction on indwelling Foley catheter, AAA s/p TAVR, CAD s/p CABG, hypertension, CKD stage II, chronic diastolic CHF, hyperlipidemia, morbid obesity presented with malaise, generalized weakness for 1 day.  Being treated for UTI, culture pending.  Also had recent ABI which was abnormal.  Vascular surgery was consulted for poorly healing wounds.    Subjective   Patient seen and examined, underwent angiogram per vascular surgery today.  Denies any complaints.   Assessment/Plan:     Complicated UTI -Patient has indwelling Foley catheter -Presented with hematuria, pyuria -Required vancomycin and cefepime in the ED -Urine culture grew Pseudomonas -Antibiotics changed to cefepime  Metabolic encephalopathy -Resolved -Secondary to UTI as above  Acute kidney injury on CKD stage II -Lasix on hold -Creatinine has improved to 1.13  Chronic right heel ulcer -ABI was abnormal, also worsening right heel wound -vascular surgery was consulted -Underwent angiogram today; posterior tibial balloon angioplasty -Started on aspirin and Plavix -We will discuss with vascular regarding restarting Xarelto  Chronic atrial fibrillation -Heart rate controlled -Continue amiodarone -Xarelto was on hold for procedure -We will check with vascular surgery when to restart Xarelto  Chronic rheumatoid arthritis -On prednisone -Continue leflunomide  BPH -Continue Proscar     Scheduled medications:    amiodarone  200 mg Oral Daily   [START ON 10/27/2020] aspirin EC  81 mg Oral Daily   atorvastatin  40 mg Oral Daily   Chlorhexidine Gluconate Cloth  6 each Topical Q0600   [START ON 10/27/2020] clopidogrel  75 mg Oral Q breakfast   docusate sodium  100 mg Oral BID   ezetimibe  10 mg Oral Daily    finasteride  5 mg Oral Daily   gabapentin  100 mg Oral TID   galantamine  8 mg Oral Q breakfast   hydrALAZINE  25 mg Oral BID   isosorbide mononitrate  60 mg Oral Daily   leflunomide  10 mg Oral Daily   memantine  10 mg Oral BID   pantoprazole  40 mg Oral QODAY   predniSONE  16 mg Oral Q breakfast   psyllium  1 packet Oral Daily   sodium chloride flush  3 mL Intravenous Q12H     Data Reviewed:   CBG:  No results for input(s): GLUCAP in the last 168 hours.  SpO2: 97 % O2 Flow Rate (L/min): 0 L/min    Vitals:   10/26/20 1400 10/26/20 1500 10/26/20 1600 10/26/20 1700  BP: (!) 142/77 137/64 (!) 155/65 104/68  Pulse: 91 62    Resp: 15 17 11 13   Temp: 98.1 F (36.7 C) 98.1 F (36.7 C)    TempSrc: Oral Oral    SpO2: 99% 97%    Weight:      Height:         Intake/Output Summary (Last 24 hours) at 10/26/2020 1944 Last data filed at 10/26/2020 1200 Gross per 24 hour  Intake 0 ml  Output 2450 ml  Net -2450 ml    10/25 0701 - 10/26 1900 In: 436 [P.O.:236] Out: 2450 [Urine:2450]  Filed Weights   10/22/20 1214  Weight: 95.3 kg    Data Reviewed: Basic Metabolic Panel: Recent Labs  Lab 10/22/20 1217 10/23/20 0241 10/24/20 0231 10/25/20 0119 10/26/20 0327  NA 142 140 138 137 137  K 3.5 3.3* 3.3*  4.2 4.2  CL 109 107 109 109 110  CO2 24 23 23  21* 20*  GLUCOSE 131* 110* 98 108* 106*  BUN 29* 21 18 19 20   CREATININE 1.92* 1.69* 1.32* 1.21 1.13  CALCIUM 8.6* 8.1* 8.1* 8.2* 8.5*  MG  --  2.1  --   --   --    Liver Function Tests: Recent Labs  Lab 10/22/20 1217  AST 33  ALT 37  ALKPHOS 90  BILITOT 0.6  PROT 5.8*  ALBUMIN 2.9*   No results for input(s): LIPASE, AMYLASE in the last 168 hours. No results for input(s): AMMONIA in the last 168 hours. CBC: Recent Labs  Lab 10/22/20 1217 10/23/20 0241 10/24/20 0231 10/26/20 0327  WBC 8.9 8.1 7.2 4.9  NEUTROABS 7.8* 6.6  --   --   HGB 8.4* 8.0* 8.4* 8.3*  HCT 27.0* 24.8* 26.8* 26.1*  MCV 98.9 95.8  95.7 93.5  PLT 123* 112* 111* 112*   Cardiac Enzymes: Recent Labs  Lab 10/22/20 1856  CKTOTAL 52   BNP (last 3 results) Recent Labs    08/17/20 0912 08/20/20 0030 08/23/20 0240  BNP 247.9* 227.9* 199.4*    ProBNP (last 3 results) No results for input(s): PROBNP in the last 8760 hours.  CBG: No results for input(s): GLUCAP in the last 168 hours.     Radiology Reports  PERIPHERAL VASCULAR CATHETERIZATION  Result Date: 10/26/2020 Images from the original result were not included. Patient name: [name] [name] MRN: [id number] DOB: [birth year] Sex: male 10/26/2020 Pre-operative Diagnosis: Right lower extremity, Rutherford 5, critical limb ischemia Post-operative diagnosis:  Same Surgeon:  Broadus John, MD Procedure Performed: 1.  Ultrasound-guided micropuncture access of the left common femoral artery 2.  Aortogram 3.  Second-order cannulation, right lower extremity angiogram 4.  Posterior tibial ultrasound-guided pedal access 5.  Posterior tibial balloon angioplasty using a 3 x 220 balloon 6.  Device assisted closure-Mynx 7.  Moderate sedation time 159 minutes Indications: Patient is a 87 year old male has been followed in the wound clinic with right heel wound.  Wound is continued to worsen over the last several weeks.  Recent ABI demonstrated referral arterial disease with monophasic waveforms in the right lower extremity.  After discussing the risks and benefits of right lower extremity angiography with possible intervention, [name] [name] elected to proceed. Findings: Aortogram: Patent bilateral renal arteries, infrarenal abdominal aneurysm roughly 3.5 cm in diameter. On the right, normal common iliac artery, normal external iliac artery, normal internal iliac artery, normal common femoral artery, normal profunda, normal superficial femoral artery, normal popliteal artery.  Ostial stenosis of the anterior tibial artery which occludes 3 cm from its ostia giving off several collaterals.  Normal  tibioperoneal trunk.  The peroneal artery provides inline flow to the distal tibia, however ends in diffuse collaterals.  The posterior tibial artery occludes mid tibia and reconstitutes at the ankle from a peroneal collateral occlusion length, roughly 120 cm. On the left, normal common iliac artery, normal external iliac artery, normal internal iliac artery.  The left leg was not imaged.  Procedure:  The patient was identified in the holding area and taken to room 8.  The patient was then placed supine on the table and prepped and draped in the usual sterile fashion.  A time out was called.  Ultrasound was used to evaluate the left common femoral artery.  It was patent .  A digital ultrasound image was acquired.  A micropuncture needle was used to access the left  common femoral artery under ultrasound guidance.  An 018 wire was advanced without resistance and a micropuncture sheath was placed.  The 018 wire was removed and a benson wire was placed.  The micropuncture sheath was exchanged for a 5 french sheath.  An omniflush catheter was advanced over the wire to the level of L-1.  An abdominal angiogram was obtained.  Next, using the omniflush catheter and a benson wire, the aortic bifurcation was crossed and the catheter was placed into theright external iliac artery and right runoff was obtained. Findings above Intervention: The decision was made to attempt intervention on the right posterior tibial artery.  A 6 x 65cm sheath was parked in the superficial femoral artery.  A series of wires and catheters were used however the wire would not advance through the 120 cm posterior tibial artery occlusion.  The foot was prepped and the posterior tibial artery was accessed at the medial malleolus using an ultrasound-guided micropuncture needle.  An 018 wire was used to traverse the posterior tibial artery.  Using a series of wires and catheters, I was able to identify and cannulate the true lumen.  A catheter was run  into the popliteal artery with angiogram via intraluminal.  At this point the posterior tibial access site was dilated to a 4/5 sheath and a 3 x 220 mm angioplasty balloon was brought into the field and inflated along the entirety of the posterior tibial artery.  I had concerned that the distal access point was stenotic, therefore I once again became antegrade and was able to traverse the posterior tibial artery.  The 014 wire cannulated the pedal sheath and was externalized.  The 3 x 220 cm angioplasty balloon was once again brought to the field and run in antegrade fashion for total plasty of the posterior tibial artery. Completion angiogram demonstrated excellent result with inline flow to the foot, no stenosis appreciated, no dissection flap appreciated. The groin access was managed with a Mynx closure device, and the right pedal PT access was managed with manual pressure. The posterior tibial artery was palpable at case completion. Cassandria Santee, MD [facility] Office: [phone]       Antibiotics: Anti-infectives (From admission, onward)    Start     Dose/Rate Route Frequency Ordered Stop   10/25/20 1445  ceFEPIme (MAXIPIME) 1 g in sodium chloride 0.9 % 100 mL IVPB        1 g 200 mL/hr over 30 Minutes Intravenous Every 12 hours 10/25/20 1351     10/23/20 1600  ceFEPIme (MAXIPIME) 2 g in sodium chloride 0.9 % 100 mL IVPB  Status:  Discontinued        2 g 200 mL/hr over 30 Minutes Intravenous Every 24 hours 10/22/20 1513 10/22/20 1635   10/23/20 1000  cefTRIAXone (ROCEPHIN) 2 g in sodium chloride 0.9 % 100 mL IVPB  Status:  Discontinued        2 g 200 mL/hr over 30 Minutes Intravenous Every 24 hours 10/22/20 1635 10/25/20 1351   10/22/20 1512  vancomycin variable dose per unstable renal function (pharmacist dosing)  Status:  Discontinued         Does not apply See admin instructions 10/22/20 1513 10/24/20 1058   10/22/20 1430  ceFEPIme (MAXIPIME) 2 g in  sodium chloride 0.9 % 100 mL IVPB        2 g 200 mL/hr over 30 Minutes Intravenous  Once 10/22/20 1425 10/22/20 1624   10/22/20  1430  metroNIDAZOLE (FLAGYL) IVPB 500 mg        500 mg 100 mL/hr over 60 Minutes Intravenous  Once 10/22/20 1425 10/22/20 1658   10/22/20 1430  vancomycin (VANCOREADY) IVPB 2000 mg/400 mL        2,000 mg 200 mL/hr over 120 Minutes Intravenous  Once 10/22/20 1425 10/22/20 1932         DVT prophylaxis: Xarelto on hold  Code Status: DNR  Family Communication: No family at bedside   Consultants: Vascular surgery  Procedures: Aortogram    Objective    Physical Examination:   General-appears in no acute distress Heart-S1-S2, regular, no murmur auscultated Lungs-clear to auscultation bilaterally, no wheezing or crackles auscultated Abdomen-soft, nontender, no organomegaly Extremities-no edema in the lower extremities Neuro-alert, oriented x3, no focal deficit noted  Status is: Inpatient  Dispo: The patient is from: Home              Anticipated d/c is to: Home              Anticipated d/c date is: 10/27/2020              Patient currently not stable for discharge  Barrier to discharge-treatment for UTI  COVID-19 Labs  No results for input(s): DDIMER, FERRITIN, LDH, CRP in the last 72 hours.  Lab Results  Component Value Date   SARSCOV2NAA NEGATIVE 10/22/2020   [place] NEGATIVE 08/23/2020   [place] NEGATIVE 08/17/2020   [place] NEGATIVE 11/07/2019     Pressure Injury 10/22/20 Heel Right;Lateral Stage 3 -  Full thickness tissue loss. Subcutaneous fat may be visible but bone, tendon or muscle are NOT exposed. 1.4cm x 1.6cm x 0.1cm (Active)  10/22/20 1843  Location: Heel  Location Orientation: Right;Lateral  Staging: Stage 3 -  Full thickness tissue loss. Subcutaneous fat may be visible but bone, tendon or muscle are NOT exposed.  Wound Description (Comments): 1.4cm x 1.6cm x 0.1cm  Present on Admission: Yes      Pressure Injury 10/22/20 Heel Right;Medial Stage 3 -  Full thickness tissue loss. Subcutaneous fat may be visible but bone, tendon or muscle are NOT exposed. 1.2cm x 1.0cm x 0.1cm (Active)  10/22/20 1846  Location: Heel  Location Orientation: Right;Medial  Staging: Stage 3 -  Full thickness tissue loss. Subcutaneous fat may be visible but bone, tendon or muscle are NOT exposed.  Wound Description (Comments): 1.2cm x 1.0cm x 0.1cm  Present on Admission: Yes        Recent Results (from the past 240 hour(s))  Urine Culture     Status: Abnormal   Collection Time: 10/22/20 12:17 PM   Specimen: In/Out Cath Urine  Result Value Ref Range Status   Specimen Description IN/OUT CATH URINE  Final   Special Requests   Final    Normal Performed at [facility], [address], [place], [place] [address]    Culture (A)  Final    >=100,000 COLONIES/mL ENTEROBACTER CLOACAE >=100,000 COLONIES/mL PSEUDOMONAS AERUGINOSA    Report Status 10/26/2020 FINAL  Final   Organism ID, Bacteria ENTEROBACTER CLOACAE (A)  Final   Organism ID, Bacteria PSEUDOMONAS AERUGINOSA (A)  Final      Susceptibility   Enterobacter cloacae - MIC*    CEFAZOLIN >=64 RESISTANT Resistant     CEFEPIME <=0.12 SENSITIVE Sensitive     CIPROFLOXACIN <=0.25 SENSITIVE Sensitive     GENTAMICIN <=1 SENSITIVE Sensitive     IMIPENEM <=0.25 SENSITIVE Sensitive     NITROFURANTOIN  32 SENSITIVE Sensitive     TRIMETH/SULFA <=20 SENSITIVE Sensitive     PIP/TAZO <=4 SENSITIVE Sensitive     * >=100,000 COLONIES/mL ENTEROBACTER CLOACAE   Pseudomonas aeruginosa - MIC*    CEFTAZIDIME 2 SENSITIVE Sensitive     CIPROFLOXACIN 1 INTERMEDIATE Intermediate     GENTAMICIN <=1 SENSITIVE Sensitive     IMIPENEM >=16 RESISTANT Resistant     PIP/TAZO <=4 SENSITIVE Sensitive     CEFEPIME 2 SENSITIVE Sensitive     * >=100,000 COLONIES/mL PSEUDOMONAS AERUGINOSA  Blood culture (routine single)     Status: None (Preliminary result)   Collection  Time: 10/22/20 12:37 PM   Specimen: BLOOD  Result Value Ref Range Status   Specimen Description BLOOD LEFT ANTECUBITAL  Final   Special Requests   Final    BOTTLES DRAWN AEROBIC AND ANAEROBIC Blood Culture results may not be optimal due to an inadequate volume of blood received in culture bottles   Culture   Final    NO GROWTH 4 DAYS Performed at [facility], [place] [address] [place], [place] [address]    Report Status PENDING  Incomplete  Resp Panel by RT-PCR (Flu A&B, Covid) Nasopharyngeal Swab     Status: None   Collection Time: 10/22/20  5:53 PM   Specimen: Nasopharyngeal Swab; Nasopharyngeal(NP) swabs in vial transport medium  Result Value Ref Range Status   SARS Coronavirus 2 by RT PCR NEGATIVE NEGATIVE Final    Comment: (NOTE) SARS-CoV-2 target nucleic acids are NOT DETECTED.  The SARS-CoV-2 RNA is generally detectable in upper respiratory specimens during the acute phase of infection. The lowest concentration of SARS-CoV-2 viral copies this assay can detect is 138 copies/mL. A negative result does not preclude SARS-Cov-2 infection and should not be used as the sole basis for treatment or other patient management decisions. A negative result may occur with  improper specimen collection/handling, submission of specimen other than nasopharyngeal swab, presence of viral mutation(s) within the areas targeted by this assay, and inadequate number of viral copies(<138 copies/mL). A negative result must be combined with clinical observations, patient history, and epidemiological information. The expected result is Negative.  Fact Sheet for Patients:  EntrepreneurPulse.com.au  Fact Sheet for Healthcare Providers:  IncredibleEmployment.be  This test is no t yet approved or cleared by the [place] FDA and  has been authorized for detection and/or diagnosis of SARS-CoV-2 by FDA under an Emergency Use Authorization (EUA). This EUA will  remain  in effect (meaning this test can be used) for the duration of the COVID-19 declaration under Section 564(b)(1) of the Act, 21 U.S.C.section 360bbb-3(b)(1), unless the authorization is terminated  or revoked sooner.       Influenza A by PCR NEGATIVE NEGATIVE Final   Influenza B by PCR NEGATIVE NEGATIVE Final    Comment: (NOTE) The Xpert Xpress SARS-CoV-2/FLU/RSV plus assay is intended as an aid in the diagnosis of influenza from Nasopharyngeal swab specimens and should not be used as a sole basis for treatment. Nasal washings and aspirates are unacceptable for Xpert Xpress SARS-CoV-2/FLU/RSV testing.  Fact Sheet for Patients: EntrepreneurPulse.com.au  Fact Sheet for Healthcare Providers: IncredibleEmployment.be  This test is not yet approved or cleared by the [place] FDA and has been authorized for detection and/or diagnosis of SARS-CoV-2 by FDA under an Emergency Use Authorization (EUA). This EUA will remain in effect (meaning this test can be used) for the duration of the COVID-19 declaration under Section 564(b)(1) of the Act, 21 U.S.C. section  360bbb-3(b)(1), unless the authorization is terminated or revoked.  Performed at [facility], [place] [address], [place], [place] [address]     [facility] If 7PM-7AM, please contact night-coverage at [email or web address], Office  [phone]   10/26/2020, 7:44 PM  LOS: 4 days

## 2020-10-26 NOTE — Progress Notes (Addendum)
  Daily Progress Note    Subjective: Doing well this morning. No complaints  Objective: Vitals:   10/25/20 2330 10/26/20 0344  BP:  131/76  Pulse:  60  Resp: 13   Temp:  97.7 F (36.5 C)  SpO2:  98%    Physical Examination General:  WDWN in NAD Gait: Not observed HENT: WNL, normocephalic Pulmonary: normal non-labored breathing Cardiac: regular Abdomen:  soft, NT/ND, no masses Vascular Exam/Pulses: 2 + femoral pulses bilaterally, could not appreciate right foot pulse, 2+ left PT pulse. Bilateral lower extremities edematous. Chronic venous stasis changes to the left > right lower extremity. Abrasion of left anterior leg dressed. Extremities: without ischemic changes, without Gangrene , without cellulitis; with open wounds of the right heel Right lateral heel Right medial posterior heel Musculoskeletal: no muscle wasting or atrophy       Neurologic: A&O X 3;  No focal weakness or paresthesias are detected; speech is fluent/normal Psychiatric:  The pt has Normal affect.  ASSESSMENT/PLAN:   Pt with RLE Rutherford 5 critical limb ischemia Right heel wound, pressure induced Minimal ambulation over the last 6 months After discussing the risks and benefits of RLE angiogram with possible intervention, [name] [name] elected to proceed.    [name] Santee MD MS [facility] [phone] 10/26/2020  8:19 AM

## 2020-10-26 NOTE — Progress Notes (Signed)
Patient had gone for aortogram this morning. Called the cath lab to confirm that patient is going to 4E and not coming back to 2W.

## 2020-10-27 DIAGNOSIS — L03116 Cellulitis of left lower limb: Secondary | ICD-10-CM

## 2020-10-27 DIAGNOSIS — N179 Acute kidney failure, unspecified: Secondary | ICD-10-CM | POA: Diagnosis not present

## 2020-10-27 DIAGNOSIS — Z9862 Peripheral vascular angioplasty status: Secondary | ICD-10-CM

## 2020-10-27 DIAGNOSIS — I70239 Atherosclerosis of native arteries of right leg with ulceration of unspecified site: Secondary | ICD-10-CM

## 2020-10-27 DIAGNOSIS — N3001 Acute cystitis with hematuria: Secondary | ICD-10-CM | POA: Diagnosis not present

## 2020-10-27 DIAGNOSIS — L97919 Non-pressure chronic ulcer of unspecified part of right lower leg with unspecified severity: Secondary | ICD-10-CM

## 2020-10-27 DIAGNOSIS — G9340 Encephalopathy, unspecified: Secondary | ICD-10-CM | POA: Diagnosis not present

## 2020-10-27 LAB — BASIC METABOLIC PANEL
Anion gap: 6 (ref 5–15)
BUN: 15 mg/dL (ref 8–23)
CO2: 19 mmol/L — ABNORMAL LOW (ref 22–32)
Calcium: 8.2 mg/dL — ABNORMAL LOW (ref 8.9–10.3)
Chloride: 112 mmol/L — ABNORMAL HIGH (ref 98–111)
Creatinine, Ser: 1.09 mg/dL (ref 0.61–1.24)
GFR, Estimated: >60 mL/min (ref >60)
Glucose, Bld: 95 mg/dL (ref 70–99)
Potassium: 3.8 mmol/L (ref 3.5–5.1)
Sodium: 137 mmol/L (ref 135–145)

## 2020-10-27 LAB — CBC
HCT: 26.5 % — ABNORMAL LOW (ref 39.0–52.0)
Hemoglobin: 8.2 g/dL — ABNORMAL LOW (ref 13.0–17.0)
MCH: 30 pg (ref 26.0–34.0)
MCHC: 30.9 g/dL (ref 30.0–36.0)
MCV: 97.1 fL (ref 80.0–100.0)
Platelets: 118 10*3/uL — ABNORMAL LOW (ref 150–400)
RBC: 2.73 MIL/uL — ABNORMAL LOW (ref 4.22–5.81)
RDW: 15.1 % (ref 11.5–15.5)
WBC: 6.1 10*3/uL (ref 4.0–10.5)
nRBC: 0 % (ref 0.0–0.2)

## 2020-10-27 LAB — CULTURE, BLOOD (SINGLE): Culture: NO GROWTH

## 2020-10-27 MED ORDER — ASPIRIN 81 MG PO TBEC: 81.0000 mg | DELAYED_RELEASE_TABLET | Freq: Every day | ORAL | 11 refills | Status: DC

## 2020-10-27 MED ORDER — ATORVASTATIN CALCIUM 40 MG PO TABS: 40.0000 mg | ORAL_TABLET | Freq: Every day | ORAL | 2 refills | Status: DC

## 2020-10-27 NOTE — Plan of Care (Signed)
  Problem: Acute Rehab PT Goals(only PT should resolve) Goal: Pt Will Go Supine/Side To Sit Outcome: Adequate for Discharge Goal: Pt Will Go Sit To Supine/Side Outcome: Adequate for Discharge Goal: Patient Will Transfer Sit To/From Stand Outcome: Adequate for Discharge Goal: Pt Will Transfer Bed To Chair/Chair To Bed Outcome: Adequate for Discharge Goal: Pt Will Ambulate Outcome: Adequate for Discharge Goal: Pt Will Go Up/Down Stairs Outcome: Adequate for Discharge

## 2020-10-27 NOTE — TOC Progression Note (Signed)
Transition of Care [facility]) - Progression Note    Patient Details  Name: [name] [name] MRN: [id number] Date of Birth: 11/19/1926  [facility]) CM/SW Contact  Zenon Mayo, RN Phone Number: 10/27/2020, 2:42 PM  Clinical Narrative:    Patient is for dc today, he is set up with [facility], This NCM notified Stacie with [facility] about discharge today.  He has the w/chair in the room, wife will transport him home.   Expected Discharge Plan: [place] Barriers to Discharge: Continued Medical Work up  Expected Discharge Plan and Services Expected Discharge Plan: [place], Durable Medical Equipment Living arrangements for the past 2 months: Single Family Home Expected Discharge Date: 10/27/20               DME Arranged: Wheelchair manual DME Agency: [employer] Date DME Agency Contacted: 10/26/20 Time DME Agency Contacted: [phone] Representative spoke with at DME Agency: [place] Interventions    Readmission Risk Interventions No flowsheet data found.

## 2020-10-27 NOTE — Discharge Summary (Signed)
Physician Discharge Summary  [name] [name] BPZ:[id number] DOB: March 03, 1926 DOA: 10/22/2020  PCP: [name], MD  Admit date: 10/22/2020 Discharge date: 10/27/2020  Time spent: 60 minutes  Recommendations for Outpatient Follow-up:  Follow-up vascular surgery in 2 weeks   Discharge Diagnoses:  Active Problems:   Urinary tract infection with hematuria   Encephalopathy   Pressure injury of skin   Discharge Condition: Stable  Diet recommendation: Heart healthy diet  Filed Weights   10/22/20 1214  Weight: 95.3 kg    History of present illness:  87 year old male with medical history of BPH with chronic ureteral obstruction on indwelling Foley catheter, AAA s/p TAVR, CAD s/p CABG, hypertension, CKD stage II, chronic diastolic CHF, hyperlipidemia, morbid obesity presented with malaise, generalized weakness for 1 day.  Being treated for UTI, culture pending.  Also had recent ABI which was abnormal.  Vascular surgery was consulted for poorly healing wounds.    Hospital Course:   Complicated UTI -Patient has indwelling Foley catheter -Presented with hematuria, pyuria -Required vancomycin and cefepime in the ED -Urine culture grew Pseudomonas, Enterobacter -Antibiotics changed to cefepime -Patient has received 5 days of antibiotics, discussed with ID.   -Patient is asymptomatic.  No further antibiotics to be given as outpatient.   Metabolic encephalopathy -Resolved -Secondary to UTI as above   Acute kidney injury on CKD stage II -Lasix on hold -Creatinine has improved to 1.13   Chronic right heel ulcer -ABI was abnormal, also worsening right heel wound -vascular surgery was consulted -Underwent angiogram today; posterior tibial balloon angioplasty -Started on aspirin and Plavix -Discussed with vascular surgery, will discontinue Plavix and continue with aspirin along with Xarelto. -Patient to follow-up with vascular surgery as outpatient -Continue wound care at outpatient  wound care center   Chronic atrial fibrillation -Heart rate controlled -Continue amiodarone -Xarelto was on hold for procedure -We will restart Xarelto   Chronic rheumatoid arthritis -On prednisone -Continue leflunomide   BPH -Continue Proscar    Procedures: Angiogram with balloon angioplasty of posterior tibial artery  Consultations: Vascular surgery  Discharge Exam: Vitals:   10/27/20 0754 10/27/20 1047  BP: 117/64 136/62  Pulse: 68 62  Resp: 18 19  Temp: 97.6 F (36.4 C) (!) 97.5 F (36.4 C)  SpO2: 100% 98%    General: Appears in no acute distress Cardiovascular: S1-S2, regular, no murmur auscultated Respiratory: Clear to auscultation bilaterally  Discharge Instructions   Discharge Instructions     Diet - low sodium heart healthy   Complete by: As directed    Discharge wound care:   Complete by: As directed    Needs heal off-loading at all times   Increase activity slowly   Complete by: As directed    No wound care   Complete by: As directed       Allergies as of 10/27/2020       Reactions   Amoxicillin Other (See Comments)   Headache, debilitated   Bee Venom Anaphylaxis   Avelox [moxifloxacin]    Other reaction(s): Unknown   Duloxetine Hcl    Other reaction(s): felt weird   Levitra [vardenafil]    Other reaction(s): Unknown   Morphine And Related Other (See Comments)   hypotension   Testosterone Other (See Comments)   unknown   Viagra [sildenafil]    Other reaction(s): didn't like it        Medication List     STOP taking these medications    amLODipine 10 MG tablet Commonly known as: NORVASC  oxyCODONE 5 MG immediate release tablet Commonly known as: Oxy IR/ROXICODONE       TAKE these medications    acetaminophen 500 MG tablet Commonly known as: TYLENOL Take 2 tablets (1,000 mg total) by mouth 3 (three) times daily. For 3 to 5 days and then change to 1000 mg 3 times daily as needed for pain. What changed:  how much  to take when to take this reasons to take this additional instructions   amiodarone 200 MG tablet Commonly known as: PACERONE Take 1 tablet (200 mg total) by mouth daily.   aspirin 81 MG EC tablet Take 1 tablet (81 mg total) by mouth daily. Swallow whole. Start taking on: October 28, 2020   atorvastatin 40 MG tablet Commonly known as: LIPITOR Take 1 tablet (40 mg total) by mouth daily. Start taking on: October 28, 2020   cholecalciferol 25 MCG (1000 UNIT) tablet Commonly known as: VITAMIN D3 Take 1,000 Units by mouth daily.   cyclobenzaprine 10 MG tablet Commonly known as: FLEXERIL Take 10 mg by mouth as needed for muscle spasms.   docusate sodium 100 MG capsule Commonly known as: COLACE Take 1 capsule (100 mg total) by mouth 2 (two) times daily.   EPINEPHrine 0.3 mg/0.3 mL Soaj injection Commonly known as: EPI-PEN Inject 0.3 mLs (0.3 mg total) into the muscle as needed for anaphylaxis. What changed: when to take this   ezetimibe 10 MG tablet Commonly known as: ZETIA Take 10 mg by mouth daily.   finasteride 5 MG tablet Commonly known as: PROSCAR Take 5 mg by mouth daily.   fish oil-omega-3 fatty acids 1000 MG capsule Take 1 g by mouth daily.   fluticasone 50 MCG/ACT nasal spray Commonly known as: FLONASE Place 1 spray into both nostrils daily as needed for allergies.   gabapentin 100 MG capsule Commonly known as: NEURONTIN Take 100 mg by mouth 3 (three) times daily.   galantamine 8 MG 24 hr capsule Commonly known as: RAZADYNE ER Take 8 mg by mouth Daily.   HONEY BEE VENOM IJ Inject 2 each as directed See admin instructions. Every eight weeks   hydrALAZINE 25 MG tablet Commonly known as: APRESOLINE Take 25 mg by mouth 2 (two) times daily.   isosorbide mononitrate 60 MG 24 hr tablet Commonly known as: IMDUR TAKE 1 TABLET(60 MG) BY MOUTH DAILY AFTER SUPPER What changed: See the new instructions.   leflunomide 10 MG tablet Commonly known as:  ARAVA Take 10 mg by mouth daily.   magnesium gluconate 500 MG tablet Commonly known as: MAGONATE Take 500 mg by mouth daily.   memantine 10 MG tablet Commonly known as: NAMENDA Take 10 mg by mouth 2 (two) times daily.   metoprolol succinate 25 MG 24 hr tablet Commonly known as: TOPROL-XL Take 1 tablet (25 mg total) by mouth daily as needed (for high heart rate, consistently greater than 110/min.).   nitroGLYCERIN 0.4 MG SL tablet Commonly known as: Nitrostat Place 1 tablet (0.4 mg total) under the tongue every 5 (five) minutes x 3 doses as needed for chest pain.   pantoprazole 40 MG tablet Commonly known as: PROTONIX Take 40 mg by mouth every other day.   polyethylene glycol 17 g packet Commonly known as: MIRALAX / GLYCOLAX Take 17 g by mouth 2 (two) times daily.   potassium chloride 10 MEQ CR capsule Commonly known as: MICRO-K TAKE ONE CAPSULE BY MOUTH DAILY WITH LASIX What changed:  how much to take how to take this when  to take this additional instructions   predniSONE 1 MG tablet Commonly known as: DELTASONE Take 8 mg by mouth daily with breakfast.   PRESCRIPTION MEDICATION Inhale 1 each into the lungs at bedtime. Cpap   Psyllium Husk Powd Take 1 Scoop by mouth daily.   Rivaroxaban 15 MG Tabs tablet Commonly known as: XARELTO Take 15 mg by mouth daily with supper.   tamsulosin 0.4 MG Caps capsule Commonly known as: FLOMAX Take 1 capsule (0.4 mg total) by mouth daily after supper.   torsemide 20 MG tablet Commonly known as: DEMADEX Take 2 tablets (40 mg total) by mouth daily.   triamcinolone cream 0.1 % Commonly known as: KENALOG Apply 1 application topically daily as needed (skin irritation).   vitamin B-12 1000 MCG tablet Commonly known as: CYANOCOBALAMIN Take 1,000 mcg by mouth daily.               Durable Medical Equipment  (From admission, onward)           Start     Ordered   10/26/20 1337  For home use only DME standard  manual wheelchair with seat cushion  Once       Comments: Patient suffers from peripheral arterial disease which impairs their ability to perform daily activities like bathing  in the home.  A cane will not resolve issue with performing activities of daily living. A wheelchair will allow patient to safely perform daily activities. Patient can safely propel the wheelchair in the home or has a caregiver who can provide assistance. Length of need lifetime Accessories: elevating leg rests (ELRs), wheel locks, extensions and anti-tippers.   10/26/20 1337              Discharge Care Instructions  (From admission, onward)           Start     Ordered   10/27/20 0000  Discharge wound care:       Comments: Needs heal off-loading at all times   10/27/20 1249           Allergies  Allergen Reactions   Amoxicillin Other (See Comments)    Headache, debilitated   Bee Venom Anaphylaxis   Avelox [Moxifloxacin]     Other reaction(s): Unknown   Duloxetine Hcl     Other reaction(s): felt weird   Levitra [Vardenafil]     Other reaction(s): Unknown   Morphine And Related Other (See Comments)    hypotension   Testosterone Other (See Comments)    unknown   Viagra [Sildenafil]     Other reaction(s): didn't like it    Follow-up Information     [facility] Follow up on 12/05/2020.   Specialty: Cardiology Why: at 10:15am for your echocardiogram. Please arrive at 10:00am Contact information: [address], [place]        Broadus John, MD Follow up in 2 week(s).   Specialty: Vascular Surgery Why: Office will call you to arrange your appt (sent) Contact information: [place] [address] [phone]                  The results of significant diagnostics from this hospitalization (including imaging, microbiology, ancillary and laboratory) are listed below for reference.     Significant Diagnostic Studies: US RENAL  Result Date: 10/22/2020 CLINICAL DATA:  Acute kidney injury. EXAM: RENAL / URINARY TRACT ULTRASOUND COMPLETE COMPARISON:  Abdominal ultrasound 03/22/2012. Abdominal CTA 10/13/2019. FINDINGS:  Right Kidney: Renal measurements: 10.2 x 6.5 x 6.6 cm = volume: 227.6 mL. Mild renal cortical thinning with prominent renal sinus fat. There are small renal cysts, measuring up to 2.9 cm in the upper pole. No hydronephrosis. Left Kidney: Renal measurements: 9.5 x 5.9 x 4.5 cm = volume: 132.8 mL. Mild renal cortical thinning with prominent renal sinus fat. The left kidney is not optimally visualized. No focal cortical abnormality or hydronephrosis identified. Bladder: Decompressed by Foley catheter. Other: Examination limited by body habitus. IMPRESSION: 1. Bilateral renal cortical thinning with decreased renal size compared with remote abdominal ultrasound, consistent with chronic medical renal disease. 2. No evidence of hydronephrosis. Electronically Signed   By: Richardean Sale M.D.   On: 10/22/2020 16:58   PERIPHERAL VASCULAR CATHETERIZATION  Result Date: 10/26/2020 Images from the original result were not included. Patient name: [name] [name] MRN: [id number] DOB: [birth year] Sex: male 10/26/2020 Pre-operative Diagnosis: Right lower extremity, [place] 5, critical limb ischemia Post-operative diagnosis:  Same Surgeon:  Broadus John, MD Procedure Performed: 1.  Ultrasound-guided micropuncture access of the left common femoral artery 2.  Aortogram 3.  Second-order cannulation, right lower extremity angiogram 4.  Posterior tibial ultrasound-guided pedal access 5.  Posterior tibial balloon angioplasty using a 3 x 220 balloon 6.  Device assisted closure-Mynx 7.  Moderate sedation time 159 minutes Indications: Patient is a 87 year old male has been followed in the wound clinic with right heel wound.  Wound is continued to worsen over the last several weeks.  Recent ABI  demonstrated referral arterial disease with monophasic waveforms in the right lower extremity.  After discussing the risks and benefits of right lower extremity angiography with possible intervention, Torrin elected to proceed. Findings: Aortogram: Patent bilateral renal arteries, infrarenal abdominal aneurysm roughly 3.5 cm in diameter. On the right, normal common iliac artery, normal external iliac artery, normal internal iliac artery, normal common femoral artery, normal profunda, normal superficial femoral artery, normal popliteal artery.  Ostial stenosis of the anterior tibial artery which occludes 3 cm from its ostia giving off several collaterals.  Normal tibioperoneal trunk.  The peroneal artery provides inline flow to the distal tibia, however ends in diffuse collaterals.  The posterior tibial artery occludes mid tibia and reconstitutes at the ankle from a peroneal collateral occlusion length, roughly 120 cm. On the left, normal common iliac artery, normal external iliac artery, normal internal iliac artery.  The left leg was not imaged.  Procedure:  The patient was identified in the holding area and taken to room 8.  The patient was then placed supine on the table and prepped and draped in the usual sterile fashion.  A time out was called.  Ultrasound was used to evaluate the left common femoral artery.  It was patent .  A digital ultrasound image was acquired.  A micropuncture needle was used to access the left common femoral artery under ultrasound guidance.  An 018 wire was advanced without resistance and a micropuncture sheath was placed.  The 018 wire was removed and a benson wire was placed.  The micropuncture sheath was exchanged for a 5 french sheath.  An omniflush catheter was advanced over the wire to the level of L-1.  An abdominal angiogram was obtained.  Next, using the omniflush catheter and a benson wire, the aortic bifurcation was crossed and the catheter was placed into theright external  iliac artery and right runoff was obtained. Findings above Intervention: The decision was made to attempt intervention on the right  posterior tibial artery.  A 6 x 65cm sheath was parked in the superficial femoral artery.  A series of wires and catheters were used however the wire would not advance through the 120 cm posterior tibial artery occlusion.  The foot was prepped and the posterior tibial artery was accessed at the medial malleolus using an ultrasound-guided micropuncture needle.  An 018 wire was used to traverse the posterior tibial artery.  Using a series of wires and catheters, I was able to identify and cannulate the true lumen.  A catheter was run into the popliteal artery with angiogram via intraluminal.  At this point the posterior tibial access site was dilated to a 4/5 sheath and a 3 x 220 mm angioplasty balloon was brought into the field and inflated along the entirety of the posterior tibial artery.  I had concerned that the distal access point was stenotic, therefore I once again became antegrade and was able to traverse the posterior tibial artery.  The 014 wire cannulated the pedal sheath and was externalized.  The 3 x 220 cm angioplasty balloon was once again brought to the field and run in antegrade fashion for total plasty of the posterior tibial artery. Completion angiogram demonstrated excellent result with inline flow to the foot, no stenosis appreciated, no dissection flap appreciated. The groin access was managed with a Mynx closure device, and the right pedal PT access was managed with manual pressure. The posterior tibial artery was palpable at case completion. Cassandria Santee, MD [facility] Office: [phone]   DG Chest Port 1 View  Result Date: 10/22/2020 CLINICAL DATA:  Questionable sepsis - evaluate for abnormality EXAM: PORTABLE CHEST - 1 VIEW COMPARISON:  08/22/2020 FINDINGS: The mediastinal contours are within normal limits. Unchanged  cardiomegaly. Postsurgical changes after left atrial appendage ligation, coronary artery bypass graft, and TAVR. Interval improved aeration of the upper lobes bilaterally. Mild bibasilar subsegmental atelectasis. No pleural effusion or pneumothorax. No acute osseous abnormality. Intact median sternotomy wires. IMPRESSION: 1. Mild bibasilar subsegmental atelectasis. 2. Interval resolution of previously visualized upper lobe consolidative opacities. Electronically Signed   By: Ruthann Cancer M.D.   On: 10/22/2020 12:29   VAS [place] ABI WITH/WO TBI  Result Date: 10/10/2020  LOWER EXTREMITY DOPPLER STUDY Patient Name:  KEMONI ORTEGA  Date of Exam:   10/10/2020 Medical Rec #: [id number]      Accession #:    [id number] Date of Birth: 09-Jan-1926       Patient Gender: M Patient Age:   66 years Exam Location:  [facility] Procedure:      VAS [place] ABI WITH/WO TBI Referring Phys: HOYT STONE III --------------------------------------------------------------------------------  Indications: Peripheral artery disease. High Risk Factors: Hypertension, hyperlipidemia, coronary artery disease. Other Factors: Thoracic ascending aortic aneurysm.  Comparison Study: 01/30/2019: Rt ABI 0.59; Lt ABI 0.85 Performing Technologist: Ivan Croft  Examination Guidelines: A complete evaluation includes at minimum, Doppler waveform signals and systolic blood pressure reading at the level of bilateral brachial, anterior tibial, and posterior tibial arteries, when vessel segments are accessible. Bilateral testing is considered an integral part of a complete examination. Photoelectric Plethysmograph (PPG) waveforms and toe systolic pressure readings are included as required and additional duplex testing as needed. Limited examinations for reoccurring indications may be performed as noted.  ABI Findings: +---------+------------------+-----+--------+--------+ Right    Rt Pressure (mmHg)IndexWaveformComment   +---------+------------------+-----+--------+--------+ Brachial 133                                     +---------+------------------+-----+--------+--------+  PTA      93                0.65 biphasic         +---------+------------------+-----+--------+--------+ DP       97                0.67 biphasic         +---------+------------------+-----+--------+--------+ Great Toe64                0.44                  +---------+------------------+-----+--------+--------+ +---------+------------------+-----+--------+-------+ Left     Lt Pressure (mmHg)IndexWaveformComment +---------+------------------+-----+--------+-------+ Brachial 144                                    +---------+------------------+-----+--------+-------+ PTA      174               1.21 biphasic        +---------+------------------+-----+--------+-------+ DP       150               1.04 biphasic        +---------+------------------+-----+--------+-------+ Great Toe77                0.53                 +---------+------------------+-----+--------+-------+ +-------+-----------+-----------+------------+------------+ ABI/TBIToday's ABIToday's TBIPrevious ABIPrevious TBI +-------+-----------+-----------+------------+------------+ Right  0.67       0.44       0.59        0.40         +-------+-----------+-----------+------------+------------+ Left   1.21       0.53       0.85        0.58         +-------+-----------+-----------+------------+------------+   Summary: Right: Resting right ankle-brachial index indicates moderate right lower extremity arterial disease. The right toe-brachial index is abnormal. Left: Resting left ankle-brachial index is within normal range. No evidence of significant left lower extremity arterial disease. The left toe-brachial index is abnormal.  *See table(s) above for measurements and observations.  Electronically signed by Harold Barban MD on 10/10/2020 at  5:52:16 PM.    Final    VAS [place] LOWER EXTREMITY ARTERIAL DUPLEX  Result Date: 10/10/2020 LOWER EXTREMITY ARTERIAL DUPLEX STUDY Patient Name:  BODEN STUCKY  Date of Exam:   10/10/2020 Medical Rec #: [id number]      Accession #:    [id number] Date of Birth: 12/08/1926       Patient Gender: M Patient Age:   51 years Exam Location:  [facility] Procedure:      VAS [place] LOWER EXTREMITY ARTERIAL DUPLEX Referring Phys: HOYT STONE III --------------------------------------------------------------------------------  Indications: Peripheral artery disease. High Risk Factors: Hypertension, hyperlipidemia, coronary artery disease. Other Factors: Thoracic ascending aortic aneurysm.  Current ABI: Rt ABI 0.67; Lt ABI 1.21. Comparison Study: 01/30/2019: Rt- Diffuse atherosclerotic disease observed                   throughout the lower extremity arterial system without                   hemodynamically significant stenosis; Lt- Diffuse                   atherosclerotic disease observed throughout the lower  extremity arterial system without hemodynamically significant                   stenosis. Performing Technologist: Ivan Croft  Examination Guidelines: A complete evaluation includes B-mode imaging, spectral Doppler, color Doppler, and power Doppler as needed of all accessible portions of each vessel. Bilateral testing is considered an integral part of a complete examination. Limited examinations for reoccurring indications may be performed as noted.  +-----------+--------+-----+--------+--------+--------+ RIGHT      PSV cm/sRatioStenosisWaveformComments +-----------+--------+-----+--------+--------+--------+ CFA Mid    126                  biphasic         +-----------+--------+-----+--------+--------+--------+ DFA        91                   biphasic         +-----------+--------+-----+--------+--------+--------+ SFA Prox   108                  biphasic          +-----------+--------+-----+--------+--------+--------+ SFA Mid    93                   biphasic         +-----------+--------+-----+--------+--------+--------+ SFA Distal 72                   biphasic         +-----------+--------+-----+--------+--------+--------+ POP Prox   69                   biphasic         +-----------+--------+-----+--------+--------+--------+ ATA Distal 46                   biphasic         +-----------+--------+-----+--------+--------+--------+ PTA Distal 50                   biphasic         +-----------+--------+-----+--------+--------+--------+ PERO Distal62                   biphasic         +-----------+--------+-----+--------+--------+--------+  +-----------+--------+-----+--------+--------+--------------+ LEFT       PSV cm/sRatioStenosisWaveformComments       +-----------+--------+-----+--------+--------+--------------+ CFA Mid    118                  biphasic               +-----------+--------+-----+--------+--------+--------------+ DFA        143                  biphasic               +-----------+--------+-----+--------+--------+--------------+ SFA Prox   138                  biphasic               +-----------+--------+-----+--------+--------+--------------+ SFA Mid    92                   biphasic               +-----------+--------+-----+--------+--------+--------------+ SFA Distal 142                  biphasic               +-----------+--------+-----+--------+--------+--------------+ POP Prox   65  biphasic               +-----------+--------+-----+--------+--------+--------------+ ATA Distal 35                   biphasic               +-----------+--------+-----+--------+--------+--------------+ PTA Distal 82                   biphasic               +-----------+--------+-----+--------+--------+--------------+ PERO Distal                              not visualized +-----------+--------+-----+--------+--------+--------------+  Summary: Right: Diffuse atherosclerotic disease observed throughout the lower extremity arterial system without hemodynamically significant stenosis. Left: Diffuse atherosclerotic disease observed throughout the lower extremity arterial system without hemodynamically significant stenosis.  See table(s) above for measurements and observations. Electronically signed by Harold Barban MD on 10/10/2020 at 5:51:46 PM.    Final     Microbiology: Recent Results (from the past 240 hour(s))  Urine Culture     Status: Abnormal   Collection Time: 10/22/20 12:17 PM   Specimen: In/Out Cath Urine  Result Value Ref Range Status   Specimen Description IN/OUT CATH URINE  Final   Special Requests   Final    Normal Performed at [facility], [address] [place], [place] [address]    Culture (A)  Final    >=100,000 COLONIES/mL ENTEROBACTER CLOACAE >=100,000 COLONIES/mL PSEUDOMONAS AERUGINOSA    Report Status 10/26/2020 FINAL  Final   Organism ID, Bacteria ENTEROBACTER CLOACAE (A)  Final   Organism ID, Bacteria PSEUDOMONAS AERUGINOSA (A)  Final      Susceptibility   Enterobacter cloacae - MIC*    CEFAZOLIN >=64 RESISTANT Resistant     CEFEPIME <=0.12 SENSITIVE Sensitive     CIPROFLOXACIN <=0.25 SENSITIVE Sensitive     GENTAMICIN <=1 SENSITIVE Sensitive     IMIPENEM <=0.25 SENSITIVE Sensitive     NITROFURANTOIN 32 SENSITIVE Sensitive     TRIMETH/SULFA <=20 SENSITIVE Sensitive     PIP/TAZO <=4 SENSITIVE Sensitive     * >=100,000 COLONIES/mL ENTEROBACTER CLOACAE   Pseudomonas aeruginosa - MIC*    CEFTAZIDIME 2 SENSITIVE Sensitive     CIPROFLOXACIN 1 INTERMEDIATE Intermediate     GENTAMICIN <=1 SENSITIVE Sensitive     IMIPENEM >=16 RESISTANT Resistant     PIP/TAZO <=4 SENSITIVE Sensitive     CEFEPIME 2 SENSITIVE Sensitive     * >=100,000 COLONIES/mL PSEUDOMONAS AERUGINOSA  Blood culture (routine single)      Status: None   Collection Time: 10/22/20 12:37 PM   Specimen: BLOOD  Result Value Ref Range Status   Specimen Description BLOOD LEFT ANTECUBITAL  Final   Special Requests   Final    BOTTLES DRAWN AEROBIC AND ANAEROBIC Blood Culture results may not be optimal due to an inadequate volume of blood received in culture bottles   Culture   Final    NO GROWTH 5 DAYS Performed at [facility] [address], [place], [place] [address]    Report Status 10/27/2020 FINAL  Final  Resp Panel by RT-PCR (Flu A&B, Covid) Nasopharyngeal Swab     Status: None   Collection Time: 10/22/20  5:53 PM   Specimen: Nasopharyngeal Swab; Nasopharyngeal(NP) swabs in vial transport medium  Result Value Ref Range Status   SARS Coronavirus 2 by RT PCR  NEGATIVE NEGATIVE Final    Comment: (NOTE) SARS-CoV-2 target nucleic acids are NOT DETECTED.  The SARS-CoV-2 RNA is generally detectable in upper respiratory specimens during the acute phase of infection. The lowest concentration of SARS-CoV-2 viral copies this assay can detect is 138 copies/mL. A negative result does not preclude SARS-Cov-2 infection and should not be used as the sole basis for treatment or other patient management decisions. A negative result may occur with  improper specimen collection/handling, submission of specimen other than nasopharyngeal swab, presence of viral mutation(s) within the areas targeted by this assay, and inadequate number of viral copies(<138 copies/mL). A negative result must be combined with clinical observations, patient history, and epidemiological information. The expected result is Negative.  Fact Sheet for Patients:  EntrepreneurPulse.com.au  Fact Sheet for Healthcare Providers:  IncredibleEmployment.be  This test is no t yet approved or cleared by the [place] FDA and  has been authorized for detection and/or diagnosis of SARS-CoV-2 by FDA under an Emergency Use  Authorization (EUA). This EUA will remain  in effect (meaning this test can be used) for the duration of the COVID-19 declaration under Section 564(b)(1) of the Act, 21 U.S.C.section 360bbb-3(b)(1), unless the authorization is terminated  or revoked sooner.       Influenza A by PCR NEGATIVE NEGATIVE Final   Influenza B by PCR NEGATIVE NEGATIVE Final    Comment: (NOTE) The Xpert Xpress SARS-CoV-2/FLU/RSV plus assay is intended as an aid in the diagnosis of influenza from Nasopharyngeal swab specimens and should not be used as a sole basis for treatment. Nasal washings and aspirates are unacceptable for Xpert Xpress SARS-CoV-2/FLU/RSV testing.  Fact Sheet for Patients: EntrepreneurPulse.com.au  Fact Sheet for Healthcare Providers: IncredibleEmployment.be  This test is not yet approved or cleared by the [place] FDA and has been authorized for detection and/or diagnosis of SARS-CoV-2 by FDA under an Emergency Use Authorization (EUA). This EUA will remain in effect (meaning this test can be used) for the duration of the COVID-19 declaration under Section 564(b)(1) of the Act, 21 U.S.C. section 360bbb-3(b)(1), unless the authorization is terminated or revoked.  Performed at [facility], [place] [address], [place], [place] [address]      Labs: Basic Metabolic Panel: Recent Labs  Lab 10/23/20 0241 10/24/20 0231 10/25/20 0119 10/26/20 0327 10/27/20 0201  NA 140 138 137 137 137  K 3.3* 3.3* 4.2 4.2 3.8  CL 107 109 109 110 112*  CO2 23 23 21* 20* 19*  GLUCOSE 110* 98 108* 106* 95  BUN 21 18 19 20 15   CREATININE 1.69* 1.32* 1.21 1.13 1.09  CALCIUM 8.1* 8.1* 8.2* 8.5* 8.2*  MG 2.1  --   --   --   --    Liver Function Tests: Recent Labs  Lab 10/22/20 1217  AST 33  ALT 37  ALKPHOS 90  BILITOT 0.6  PROT 5.8*  ALBUMIN 2.9*   No results for input(s): LIPASE, AMYLASE in the last 168 hours. No results for input(s): AMMONIA in  the last 168 hours. CBC: Recent Labs  Lab 10/22/20 1217 10/23/20 0241 10/24/20 0231 10/26/20 0327 10/27/20 0201  WBC 8.9 8.1 7.2 4.9 6.1  NEUTROABS 7.8* 6.6  --   --   --   HGB 8.4* 8.0* 8.4* 8.3* 8.2*  HCT 27.0* 24.8* 26.8* 26.1* 26.5*  MCV 98.9 95.8 95.7 93.5 97.1  PLT 123* 112* 111* 112* 118*   Cardiac Enzymes: Recent Labs  Lab 10/22/20 1856  CKTOTAL 52  BNP: BNP (last 3 results) Recent Labs    08/17/20 0912 08/20/20 0030 08/23/20 0240  BNP 247.9* 227.9* 199.4*    ProBNP (last 3 results) No results for input(s): PROBNP in the last 8760 hours.  CBG: No results for input(s): GLUCAP in the last 168 hours.     Signed:  Oswald Hillock MD.  [facility] 10/27/2020, 12:49 PM

## 2020-10-27 NOTE — Progress Notes (Signed)
Pt is alert and fully oriented. He requested CPAP at night. Pt did not bring his CPAP from home. Apolonio Schneiders, RT was notified, reported we do not have CPAP machine available. We discussed with Pt, he understood and agreed to continue to monitor SPO2. Pt's SPO2 96-99% on room air all night. No distress noted. He is hemodynamically stable. Remains afebrile. Good signals on PT and DP via doppler bilaterally. Left groin is negative for hematoma. No acute distress noted. Continue to monitor.  Kennyth Lose, RN

## 2020-10-27 NOTE — Progress Notes (Signed)
Discharge instructions (including medications) discussed with and copy provided to patient/caregiver 

## 2020-10-27 NOTE — Progress Notes (Addendum)
[facility]  Subjective  - No new complaints   Objective 138/64 61 97.9 F (36.6 C) (Oral) 17 98%  Intake/Output Summary (Last 24 hours) at 10/27/2020 0750 Last data filed at 10/27/2020 0100 Gross per 24 hour  Intake 754.73 ml  Output 1000 ml  Net -245.27 ml    Palpable PT right foot Left groin soft  Lungs non labored breathing   Assessment/Planning: PAD Non healing right LE wounds with PAD and abnormal ABI's S/P angiogram with Posterior tibial balloon angioplasty using a 3 x 220 balloon POD # 1  He is stable from a vascular point of view will plan f/u in 2 weeks for wound check and ABI arranged by our office Recommend continue OP wound care at wound center   [name] [name] 10/27/2020 7:50 AM   VASCULAR STAFF ADDENDUM: I have independently interviewed and examined the patient. I agree with the above.  Pt doing well, palpable PT pulse. Bloodflow optimized. Left groin soft Needs heal off-loading at all times  Cassandria Santee, MD [facility] Phone Number: [phone] 10/27/2020 8:55 AM   --  Laboratory Lab Results: Recent Labs    10/26/20 0327 10/27/20 0201  WBC 4.9 6.1  HGB 8.3* 8.2*  HCT 26.1* 26.5*  PLT 112* 118*   BMET Recent Labs    10/26/20 0327 10/27/20 0201  NA 137 137  K 4.2 3.8  CL 110 112*  CO2 20* 19*  GLUCOSE 106* 95  BUN 20 15  CREATININE 1.13 1.09  CALCIUM 8.5* 8.2*    COAG Lab Results  Component Value Date   INR 1.1 10/26/2020   INR 2.1 (H) 10/22/2020   INR 1.0 11/06/2019   No results found for: PTT

## 2020-10-27 NOTE — Progress Notes (Signed)
Physical Therapy Treatment Patient Details Name: [name] [name] MRN: [id number] DOB: 03-04-1926 Today's Date: 10/27/2020   History of Present Illness Pt is a 87 y.o. male admitted on 10/22/20 with fever and hematuria due to a UTI. Pt c/o general weakness. Angiography of the right lower extremity performed on 10/26/20 following a right heel wound. PMH incldues s/p CABG x3, CAD, BPH, sleep apnea, HTN, HDL, a-fib.    PT Comments    Pt progressing with mobility. Today's session focused on transfer and wheelchair training with RW; pt with difficult maintaining NWB through R heel with brief bouts of standing activity. Pt preparing for d/c home this afternoon; increased discussion with pt and wife re: activity recommendations, fall risk reduction, DME needs. If pt to remain admitted, will continue to follow acutely.   Recommendations for follow up therapy are one component of a multi-disciplinary discharge planning process, led by the attending physician.  Recommendations may be updated based on patient status, additional functional criteria and insurance authorization.  Follow Up Recommendations  Home health PT     Assistance Recommended at Discharge Intermittent Supervision/Assistance  Equipment Recommendations  Rolling walker (2 wheels)    Recommendations for Other Services       Precautions / Restrictions Precautions Precautions: Fall Precaution Comments: R heel ulcer Restrictions Weight Bearing Restrictions: Yes (No WB through R heel)     Mobility  Bed Mobility Overal bed mobility: Needs Assistance Bed Mobility: Rolling Rolling: Supervision   Supine to sit: Min guard     General bed mobility comments: pt rolled towards left using rail; cues to push through arms to sit up, min guard for safety    Transfers Overall transfer level: Needs assistance Equipment used: Rolling walker (2 wheels) Transfers: Sit to/from Stand Sit to Stand: Min guard           General transfer  comment: sit to stand bed <> w/c with RW, max cues to place weight on R toe rather than heel based on weight bearing restrictions, min gaurd for safety    Ambulation/Gait Ambulation/Gait assistance: Min guard Gait Distance (Feet): 2 Feet Assistive device: Rolling walker (2 wheels) Gait Pattern/deviations: Decreased weight shift to right;Shuffle;Trunk flexed Gait velocity: reduced   General Gait Details: Transferred with RW from bed <> wheelchair, max cues to place weight on R toe rather than heel based on weight bearing restrictions, min gaurd for Haematologist mobility: Yes Wheelchair propulsion: Both upper extremities Wheelchair parts: Supervision/cueing Distance: 2 Wheelchair Assistance Details (indicate cue type and reason): VC to use arms to propel and avoid using the R heel  Modified Rankin (Stroke Patients Only)       Balance Overall balance assessment: Needs assistance Sitting-balance support: No upper extremity supported;Feet supported Sitting balance-Leahy Scale: Fair Sitting balance - Comments: sit EOB with feet supported   Standing balance support: Bilateral upper extremity supported;Reliant on assistive device for balance Standing balance-Leahy Scale: Poor Standing balance comment: used RW in standing                            Cognition Arousal/Alertness: Awake/alert Behavior During Therapy: WFL for tasks assessed/performed Overall Cognitive Status: Within Functional Limits for tasks assessed  Exercises      General Comments        Pertinent Vitals/Pain Pain Assessment: 0-10 Pain Score: 3  Pain Location: right LE    Home Living                          Prior Function            PT Goals (current goals can now be found in the care plan section) Acute Rehab PT Goals Patient Stated Goal: to  improve tolerance for ambulation and reduce falls risk PT Goal Formulation: With patient/family Time For Goal Achievement: 11/06/20 Potential to Achieve Goals: Fair Progress towards PT goals: Progressing toward goals    Frequency    Min 3X/week      PT Plan Current plan remains appropriate    Co-evaluation              AM-PAC PT "6 Clicks" Mobility   Outcome Measure  Help needed turning from your back to your side while in a flat bed without using bedrails?: A Little Help needed moving from lying on your back to sitting on the side of a flat bed without using bedrails?: A Little Help needed moving to and from a bed to a chair (including a wheelchair)?: A Little Help needed standing up from a chair using your arms (e.g., wheelchair or bedside chair)?: A Little Help needed to walk in hospital room?: A Little Help needed climbing 3-5 steps with a railing? : A Lot 6 Click Score: 17    End of Session Equipment Utilized During Treatment: Gait belt Activity Tolerance: Patient tolerated treatment well Patient left: in bed;with bed alarm set;with call bell/phone within reach Nurse Communication: Mobility status;Other (comment) (heel wound needed attention) PT Visit Diagnosis: Other abnormalities of gait and mobility (R26.89);Muscle weakness (generalized) (M62.81)     Time: 4536-4680 PT Time Calculation (min) (ACUTE ONLY): 30 min  Charges:  $Therapeutic Activity: 8-22 mins $Self Care/Home Management: 8-22                    Brandon Melnick, SPT  Brandon Melnick 10/27/2020, 5:48 PM

## 2020-10-28 ENCOUNTER — Ambulatory Visit: Payer: Medicare Other | Admitting: Vascular Surgery

## 2020-10-31 ENCOUNTER — Other Ambulatory Visit: Payer: Self-pay | Admitting: *Deleted

## 2020-10-31 DIAGNOSIS — I739 Peripheral vascular disease, unspecified: Secondary | ICD-10-CM

## 2020-11-02 ENCOUNTER — Ambulatory Visit: Payer: Medicare Other | Admitting: Podiatry

## 2020-11-03 ENCOUNTER — Other Ambulatory Visit (HOSPITAL_COMMUNITY): Payer: Medicare Other

## 2020-11-03 ENCOUNTER — Ambulatory Visit: Payer: Medicare Other | Admitting: Physician Assistant

## 2020-11-04 ENCOUNTER — Ambulatory Visit: Payer: Medicare Other

## 2020-11-04 ENCOUNTER — Encounter (HOSPITAL_COMMUNITY): Payer: Medicare Other

## 2020-11-10 DIAGNOSIS — Z85820 Personal history of malignant melanoma of skin: Secondary | ICD-10-CM | POA: Diagnosis not present

## 2020-11-10 DIAGNOSIS — D225 Melanocytic nevi of trunk: Secondary | ICD-10-CM | POA: Diagnosis not present

## 2020-11-10 DIAGNOSIS — G4733 Obstructive sleep apnea (adult) (pediatric): Secondary | ICD-10-CM | POA: Diagnosis not present

## 2020-11-10 DIAGNOSIS — I259 Chronic ischemic heart disease, unspecified: Secondary | ICD-10-CM | POA: Diagnosis not present

## 2020-11-10 DIAGNOSIS — L821 Other seborrheic keratosis: Secondary | ICD-10-CM | POA: Diagnosis not present

## 2020-11-10 DIAGNOSIS — L814 Other melanin hyperpigmentation: Secondary | ICD-10-CM | POA: Diagnosis not present

## 2020-11-11 DIAGNOSIS — R338 Other retention of urine: Secondary | ICD-10-CM | POA: Diagnosis not present

## 2020-11-12 DIAGNOSIS — I480 Paroxysmal atrial fibrillation: Secondary | ICD-10-CM | POA: Diagnosis not present

## 2020-11-12 DIAGNOSIS — I130 Hypertensive heart and chronic kidney disease with heart failure and stage 1 through stage 4 chronic kidney disease, or unspecified chronic kidney disease: Secondary | ICD-10-CM | POA: Diagnosis not present

## 2020-11-12 DIAGNOSIS — I5021 Acute systolic (congestive) heart failure: Secondary | ICD-10-CM | POA: Diagnosis not present

## 2020-11-12 DIAGNOSIS — N390 Urinary tract infection, site not specified: Secondary | ICD-10-CM | POA: Diagnosis not present

## 2020-11-12 DIAGNOSIS — R338 Other retention of urine: Secondary | ICD-10-CM | POA: Diagnosis not present

## 2020-11-12 DIAGNOSIS — N39498 Other specified urinary incontinence: Secondary | ICD-10-CM | POA: Diagnosis not present

## 2020-11-12 DIAGNOSIS — M8008XD Age-related osteoporosis with current pathological fracture, vertebra(e), subsequent encounter for fracture with routine healing: Secondary | ICD-10-CM | POA: Diagnosis not present

## 2020-11-12 DIAGNOSIS — N1831 Chronic kidney disease, stage 3a: Secondary | ICD-10-CM | POA: Diagnosis not present

## 2020-11-12 DIAGNOSIS — N179 Acute kidney failure, unspecified: Secondary | ICD-10-CM | POA: Diagnosis not present

## 2020-11-12 DIAGNOSIS — N401 Enlarged prostate with lower urinary tract symptoms: Secondary | ICD-10-CM | POA: Diagnosis not present

## 2020-11-12 DIAGNOSIS — S81802D Unspecified open wound, left lower leg, subsequent encounter: Secondary | ICD-10-CM | POA: Diagnosis not present

## 2020-11-12 DIAGNOSIS — M353 Polymyalgia rheumatica: Secondary | ICD-10-CM | POA: Diagnosis not present

## 2020-11-12 DIAGNOSIS — L89611 Pressure ulcer of right heel, stage 1: Secondary | ICD-10-CM | POA: Diagnosis not present

## 2020-11-12 DIAGNOSIS — B965 Pseudomonas (aeruginosa) (mallei) (pseudomallei) as the cause of diseases classified elsewhere: Secondary | ICD-10-CM | POA: Diagnosis not present

## 2020-11-12 DIAGNOSIS — D631 Anemia in chronic kidney disease: Secondary | ICD-10-CM | POA: Diagnosis not present

## 2020-11-12 DIAGNOSIS — I5032 Chronic diastolic (congestive) heart failure: Secondary | ICD-10-CM | POA: Diagnosis not present

## 2020-11-16 ENCOUNTER — Encounter (HOSPITAL_BASED_OUTPATIENT_CLINIC_OR_DEPARTMENT_OTHER): Payer: Medicare Other | Attending: Physician Assistant | Admitting: Physician Assistant

## 2020-11-16 ENCOUNTER — Other Ambulatory Visit: Payer: Self-pay

## 2020-11-16 DIAGNOSIS — L97312 Non-pressure chronic ulcer of right ankle with fat layer exposed: Secondary | ICD-10-CM | POA: Insufficient documentation

## 2020-11-16 DIAGNOSIS — L89613 Pressure ulcer of right heel, stage 3: Secondary | ICD-10-CM | POA: Diagnosis not present

## 2020-11-16 DIAGNOSIS — N183 Chronic kidney disease, stage 3 unspecified: Secondary | ICD-10-CM | POA: Diagnosis not present

## 2020-11-16 DIAGNOSIS — I872 Venous insufficiency (chronic) (peripheral): Secondary | ICD-10-CM | POA: Insufficient documentation

## 2020-11-16 DIAGNOSIS — L97322 Non-pressure chronic ulcer of left ankle with fat layer exposed: Secondary | ICD-10-CM | POA: Diagnosis not present

## 2020-11-16 DIAGNOSIS — N179 Acute kidney failure, unspecified: Secondary | ICD-10-CM | POA: Diagnosis not present

## 2020-11-16 DIAGNOSIS — I87312 Chronic venous hypertension (idiopathic) with ulcer of left lower extremity: Secondary | ICD-10-CM | POA: Diagnosis not present

## 2020-11-16 DIAGNOSIS — D649 Anemia, unspecified: Secondary | ICD-10-CM | POA: Diagnosis not present

## 2020-11-16 DIAGNOSIS — I739 Peripheral vascular disease, unspecified: Secondary | ICD-10-CM | POA: Diagnosis not present

## 2020-11-16 DIAGNOSIS — I5020 Unspecified systolic (congestive) heart failure: Secondary | ICD-10-CM | POA: Diagnosis not present

## 2020-11-16 DIAGNOSIS — I129 Hypertensive chronic kidney disease with stage 1 through stage 4 chronic kidney disease, or unspecified chronic kidney disease: Secondary | ICD-10-CM | POA: Diagnosis not present

## 2020-11-16 DIAGNOSIS — L02416 Cutaneous abscess of left lower limb: Secondary | ICD-10-CM | POA: Diagnosis not present

## 2020-11-16 DIAGNOSIS — L97822 Non-pressure chronic ulcer of other part of left lower leg with fat layer exposed: Secondary | ICD-10-CM | POA: Diagnosis not present

## 2020-11-16 NOTE — Progress Notes (Signed)
[place], [name] ([id number]) Visit Report for 11/16/2020 Arrival Information Details Patient Name: Date of Service: [name] [name] ID I. 11/16/2020 8:45 A M Medical Record Number: [id number] Patient Account Number: [id number] Date of Birth/Sex: Treating RN: 12-02-1926 (87 y.o. [name] Primary Care Jovonne Wilton: [name] Other Clinician: Referring Shiquan Mathieu: Treating Solace Wendorff/Extender: Eli Hose in Treatment: 8 Visit Information History Since Last Visit All ordered tests and consults were completed: Yes Patient Arrived: Wheel Chair Added or deleted any medications: No Arrival Time: 08:54 Any new allergies or adverse reactions: No Accompanied By: wife Had a fall or experienced change in No Transfer Assistance: Manual activities of daily living that may affect Patient Identification Verified: Yes risk of falls: Secondary Verification Process Completed: Yes Signs or symptoms of abuse/neglect since last visito No Patient Requires Transmission-Based Precautions: No Hospitalized since last visit: Yes Patient Has Alerts: Yes Implantable device outside of the clinic excluding No Patient Alerts: Patient on Blood Thinner cellular tissue based products placed in the center since last visit: Has Dressing in Place as Prescribed: Yes Pain Present Now: No Electronic Signature(s) Signed: 11/16/2020 10:21:01 AM By: Lorrin Jackson Entered By: Lorrin Jackson on 11/16/2020 10:21:01 -------------------------------------------------------------------------------- Encounter Discharge Information Details Patient Name: Date of Service: [name] [name], [name] 11/16/2020 8:45 A M Medical Record Number: [id number] Patient Account Number: [id number] Date of Birth/Sex: Treating RN: 12/17/1926 (87 y.o. [name] Primary Care [name]: [name] Other Clinician: Referring Octavious Zidek: Treating Oline Belk/Extender: Eli Hose in Treatment:  8 Encounter Discharge Information Items Post Procedure Vitals Discharge Condition: Stable Temperature (F): 97.7 Ambulatory Status: Wheelchair Pulse (bpm): 62 Discharge Destination: Home Respiratory Rate (breaths/min): 20 Transportation: Private Auto Blood Pressure (mmHg): 140/72 Accompanied By: wife Schedule Follow-up Appointment: Yes Clinical Summary of Care: Provided on 11/16/2020 Form Type Recipient Paper Patient Patient Electronic Signature(s) Signed: 11/16/2020 10:20:49 AM By: Lorrin Jackson Entered By: Lorrin Jackson on 11/16/2020 10:20:48 -------------------------------------------------------------------------------- Lower Extremity Assessment Details Patient Name: Date of Service: [name] [name], [name] 11/16/2020 8:45 A M Medical Record Number: [id number] Patient Account Number: [id number] Date of Birth/Sex: Treating RN: [birth year] (87 y.o. [name] Primary Care Harshitha Fretz: [name] Other Clinician: Referring Brennley Curtice: Treating Khaleem Burchill/Extender: [name] in Treatment: 8 Edema Assessment Assessed: [Left: Yes] [Right: Yes] Edema: [Left: Yes] [Right: Yes] Calf Left: Right: Point of Measurement: 34 cm From Medial Instep 42.5 cm 42 cm Ankle Left: Right: Point of Measurement: 11 cm From Medial Instep 28 cm 30 cm Vascular Assessment Pulses: Dorsalis Pedis Palpable: [Left:Yes] [Right:Yes] Electronic Signature(s) Signed: 11/16/2020 5:29:48 PM By: Lorrin Jackson Entered By: Lorrin Jackson on 11/16/2020 09:16:56 -------------------------------------------------------------------------------- Multi-Disciplinary Care Plan Details Patient Name: Date of Service: [name] [name], [name] 11/16/2020 8:45 A M Medical Record Number: [id number] Patient Account Number: [id number] Date of Birth/Sex: Treating RN: 1926-08-26 (87 y.o. [name] Primary Care Ashle Stief: [name] Other Clinician: Referring Conya Ellinwood: Treating  Antonia Culbertson/Extender: Eli Hose in Treatment: 8 Active Inactive Tissue Oxygenation Nursing Diagnoses: Potential alteration in peripheral tissue perfusion (select prior to confirmation of diagnosis) Goals: Non-invasive arterial studies are completed as ordered Date Initiated: 09/21/2020 Date Inactivated: 10/19/2020 Target Resolution Date: 09/21/2020 Goal Status: Met Revascularization procedures completed as ordered Date Initiated: 09/21/2020 Target Resolution Date: 11/30/2020 Goal Status: Active Interventions: Assess patient understanding of disease process and management upon diagnosis and as needed Provide education on  tissue oxygenation and ischemia Treatment Activities: Ankle Brachial Index (ABI) : 09/21/2020 Non-invasive vascular studies : 09/21/2020 T ordered outside of clinic : 09/21/2020 est Notes: 10/19/20: Had vascular testing, awaiting appointment to see vascular MD Wound/Skin Impairment Nursing Diagnoses: Knowledge deficit related to ulceration/compromised skin integrity Goals: Patient/caregiver will verbalize understanding of skin care regimen Date Initiated: 09/21/2020 Target Resolution Date: 11/30/2020 Goal Status: Active Interventions: Assess patient/caregiver ability to obtain necessary supplies Assess patient/caregiver ability to perform ulcer/skin care regimen upon admission and as needed Provide education on ulcer and skin care Treatment Activities: Skin care regimen initiated : 09/21/2020 Topical wound management initiated : 09/21/2020 Notes: Electronic Signature(s) Signed: 11/16/2020 5:29:48 PM By: Lorrin Jackson Entered By: Lorrin Jackson on 11/16/2020 08:53:38 -------------------------------------------------------------------------------- Pain Assessment Details Patient Name: Date of Service: [name] [name], [name] 11/16/2020 8:45 A M Medical Record Number: [id number] Patient Account Number: [id number] Date of Birth/Sex: Treating  RN: September 02, 1926 (87 y.o. [name] Primary Care Fizza Scales: [name] Other Clinician: Referring Nycole Kawahara: Treating Deissy Guilbert/Extender: Eli Hose in Treatment: 8 Active Problems Location of Pain Severity and Description of Pain Patient Has Paino No Site Locations Pain Management and Medication Current Pain Management: Electronic Signature(s) Signed: 11/16/2020 5:29:48 PM By: Lorrin Jackson Entered By: Lorrin Jackson on 11/16/2020 09:17:42 -------------------------------------------------------------------------------- Patient/Caregiver Education Details Patient Name: Date of Service: [name] [name] ID I. 11/16/2022andnbsp8:45 A M Medical Record Number: [id number] Patient Account Number: [id number] Date of Birth/Gender: Treating RN: 06-Sep-1926 (87 y.o. [name] Primary Care Physician: [name] Other Clinician: Referring Physician: Treating Physician/Extender: Eli Hose in Treatment: 8 Education Assessment Education Provided To: Patient Education Topics Provided Offloading: Methods: Explain/Verbal, Printed Responses: State content correctly Wound/Skin Impairment: Methods: Explain/Verbal, Printed Responses: State content correctly Electronic Signature(s) Signed: 11/16/2020 5:29:48 PM By: Lorrin Jackson Entered By: Lorrin Jackson on 11/16/2020 08:54:05 -------------------------------------------------------------------------------- Wound Assessment Details Patient Name: Date of Service: [name] [name], [name] 11/16/2020 8:45 A M Medical Record Number: [id number] Patient Account Number: [id number] Date of Birth/Sex: Treating RN: [birth year] (87 y.o. [name] Primary Care Deshun Sedivy: [name] Other Clinician: Referring Prentice Sackrider: Treating Liyla Radliff/Extender: [name] in Treatment: 8 Wound Status Wound Number: 1 Primary Pressure Ulcer Etiology: Wound Location:  Right, Lateral Calcaneus Wound Open Wounding Event: Pressure Injury Status: Date Acquired: 06/08/2020 Comorbid Cataracts, Sleep Apnea, Arrhythmia, Congestive Heart Failure, Weeks Of Treatment: 8 History: Coronary Artery Disease, Hypertension, Myocardial Infarction, Clustered Wound: No End Stage Renal Disease, Gout, Dementia, Neuropathy Photos Wound Measurements Length: (cm) 1 Width: (cm) 1.5 Depth: (cm) 0.2 Area: (cm) 1.178 Volume: (cm) 0.236 % Reduction in Area: -138% % Reduction in Volume: -381.6% Epithelialization: Small (1-33%) Tunneling: No Undermining: No Wound Description Classification: Category/Stage III Wound Margin: Distinct, outline attached Exudate Amount: Medium Exudate Type: Serosanguineous Exudate Color: red, brown Foul Odor After Cleansing: No Slough/Fibrino Yes Wound Bed Granulation Amount: Large (67-100%) Exposed Structure Granulation Quality: Red Fascia Exposed: No Necrotic Amount: Small (1-33%) Fat Layer (Subcutaneous Tissue) Exposed: Yes Necrotic Quality: Adherent Slough Tendon Exposed: No Muscle Exposed: No Joint Exposed: No Bone Exposed: No Treatment Notes Wound #1 (Calcaneus) Wound Laterality: Right, Lateral Cleanser Normal Saline Discharge Instruction: Cleanse the wound with Normal Saline prior to applying a clean dressing using gauze sponges, not tissue or cotton balls. Soap and Water Discharge Instruction: May shower and wash wound with dial antibacterial soap and water prior to dressing change. Peri-Wound Care Skin Prep  Discharge Instruction: Use skin prep as directed Topical Primary Dressing Hydrofera Blue Ready Foam, 4x5 in Discharge Instruction: Apply to wound bed as instructed Secondary Dressing Woven Gauze Sponge, Non-Sterile 4x4 in Discharge Instruction: Apply over primary dressing as directed. ABD Pad, 5x9 Discharge Instruction: Apply over primary dressing as directed. Secured With [employer], 4.5x3.1  (in/yd) Discharge Instruction: Secure with Kerlix as directed. Paper Tape, 2x10 (in/yd) Discharge Instruction: Secure dressing with tape as directed. Compression Wrap Compression Stockings Add-Ons Electronic Signature(s) Signed: 11/16/2020 5:29:48 PM By: Lorrin Jackson Entered By: Lorrin Jackson on 11/16/2020 09:21:20 -------------------------------------------------------------------------------- Wound Assessment Details Patient Name: Date of Service: [name] [name], [name] 11/16/2020 8:45 A M Medical Record Number: [id number] Patient Account Number: [id number] Date of Birth/Sex: Treating RN: 1926/09/16 (87 y.o. [name] Primary Care Lenaya Pietsch: [name] Other Clinician: Referring Zakya Halabi: Treating Kylar Speelman/Extender: [name] in Treatment: 8 Wound Status Wound Number: 2 Primary Pressure Ulcer Etiology: Wound Location: Right, Medial Calcaneus Wound Open Wounding Event: Pressure Injury Status: Date Acquired: 10/17/2020 Comorbid Cataracts, Sleep Apnea, Arrhythmia, Congestive Heart Failure, Weeks Of Treatment: 4 History: Coronary Artery Disease, Hypertension, Myocardial Infarction, Clustered Wound: No End Stage Renal Disease, Gout, Dementia, Neuropathy Photos Wound Measurements Length: (cm) 4 Width: (cm) 4 Depth: (cm) 0.1 Area: (cm) 12.566 Volume: (cm) 1.257 % Reduction in Area: -1234% % Reduction in Volume: -1237.2% Epithelialization: None Tunneling: No Undermining: No Wound Description Classification: Category/Stage III Wound Margin: Distinct, outline attached Exudate Amount: Medium Exudate Type: Serous Exudate Color: amber Foul Odor After Cleansing: No Slough/Fibrino Yes Wound Bed Granulation Amount: Large (67-100%) Exposed Structure Granulation Quality: Red, Pink Fascia Exposed: No Necrotic Amount: Small (1-33%) Fat Layer (Subcutaneous Tissue) Exposed: Yes Necrotic Quality: Adherent Slough Tendon Exposed: No Muscle  Exposed: No Joint Exposed: No Bone Exposed: No Treatment Notes Wound #2 (Calcaneus) Wound Laterality: Right, Medial Cleanser Normal Saline Discharge Instruction: Cleanse the wound with Normal Saline prior to applying a clean dressing using gauze sponges, not tissue or cotton balls. Soap and Water Discharge Instruction: May shower and wash wound with dial antibacterial soap and water prior to dressing change. Peri-Wound Care Skin Prep Discharge Instruction: Use skin prep as directed Topical Primary Dressing Hydrofera Blue Ready Foam, 4x5 in Discharge Instruction: Apply to wound bed as instructed Secondary Dressing Woven Gauze Sponge, Non-Sterile 4x4 in Discharge Instruction: Apply over primary dressing as directed. ABD Pad, 5x9 Discharge Instruction: Apply over primary dressing as directed. Secured With [employer], 4.5x3.1 (in/yd) Discharge Instruction: Secure with Kerlix as directed. Paper Tape, 2x10 (in/yd) Discharge Instruction: Secure dressing with tape as directed. Compression Wrap Compression Stockings Add-Ons Electronic Signature(s) Signed: 11/16/2020 5:29:48 PM By: Lorrin Jackson Entered By: Lorrin Jackson on 11/16/2020 09:21:53 -------------------------------------------------------------------------------- Wound Assessment Details Patient Name: Date of Service: [name] [name], [name] 11/16/2020 8:45 A M Medical Record Number: [id number] Patient Account Number: [id number] Date of Birth/Sex: Treating RN: [birth year] (87 y.o. [name] Primary Care Nikelle Malatesta: [name] Other Clinician: Referring Amayrany Cafaro: Treating Donovyn Guidice/Extender: [name] in Treatment: 8 Wound Status Wound Number: 3 Primary Venous Leg Ulcer Etiology: Wound Location: Left, Lateral Ankle Wound Open Wounding Event: Gradually Appeared Status: Date Acquired: 10/12/2020 Comorbid Cataracts, Sleep Apnea, Arrhythmia, Congestive Heart Failure, Weeks Of  Treatment: 0 History: Coronary Artery Disease, Hypertension, Myocardial Infarction, Clustered Wound: No End Stage Renal Disease, Gout, Dementia, Neuropathy Photos Wound Measurements Length: (cm) 1.6 Width: (cm) 0.4 Depth: (cm) 0.3 Area: (cm) 0.503  Volume: (cm) 0.151 % Reduction in Area: 0% % Reduction in Volume: 0% Epithelialization: None Tunneling: No Undermining: No Wound Description Classification: Full Thickness Without Exposed Support Structures Wound Margin: Distinct, outline attached Exudate Amount: Medium Exudate Type: Serosanguineous Exudate Color: red, brown Foul Odor After Cleansing: No Slough/Fibrino Yes Wound Bed Granulation Amount: Small (1-33%) Exposed Structure Granulation Quality: Red Fascia Exposed: No Necrotic Amount: Large (67-100%) Fat Layer (Subcutaneous Tissue) Exposed: Yes Necrotic Quality: Adherent Slough Tendon Exposed: No Muscle Exposed: No Joint Exposed: No Bone Exposed: No Treatment Notes Wound #3 (Ankle) Wound Laterality: Left, Lateral Cleanser Soap and Water Discharge Instruction: May shower and wash wound with dial antibacterial soap and water prior to dressing change. Wound Cleanser Discharge Instruction: Cleanse the wound with wound cleanser prior to applying a clean dressing using gauze sponges, not tissue or cotton balls. Peri-Wound Care Skin Prep Discharge Instruction: Use skin prep as directed Topical Primary Dressing Hydrofera Blue Ready Foam, 2.5 x2.5 in Discharge Instruction: Apply to wound bed as instructed Santyl Ointment Discharge Instruction: Apply nickel thick amount to wound bed as instructed Secondary Dressing Zetuvit Plus Silicone Border Dressing 4x4 (in/in) Discharge Instruction: Apply silicone border over primary dressing as directed. Secured With Compression Wrap Compression Stockings Environmental education officer) Signed: 11/16/2020 5:29:48 PM By: Lorrin Jackson Entered By: Lorrin Jackson on 11/16/2020  09:22:29 -------------------------------------------------------------------------------- Wound Assessment Details Patient Name: Date of Service: [name] [name], [name] 11/16/2020 8:45 A M Medical Record Number: [id number] Patient Account Number: [id number] Date of Birth/Sex: Treating RN: [birth year] (87 y.o. [name] Primary Care Kebra Lowrimore: [name] Other Clinician: Referring Caylor Tallarico: Treating Daquawn Seelman/Extender: [name] in Treatment: 8 Wound Status Wound Number: 4 Primary Abscess Etiology: Wound Location: Left, Medial Lower Leg Wound Open Wounding Event: Other Lesion Status: Date Acquired: 11/16/2020 Comorbid Cataracts, Sleep Apnea, Arrhythmia, Congestive Heart Failure, Weeks Of Treatment: 0 History: Coronary Artery Disease, Hypertension, Myocardial Infarction, Clustered Wound: No End Stage Renal Disease, Gout, Dementia, Neuropathy Wound Measurements Length: (cm) 0.5 Width: (cm) 0.5 Depth: (cm) 0.9 Area: (cm) 0.196 Volume: (cm) 0.177 % Reduction in Area: % Reduction in Volume: Epithelialization: None Tunneling: No Undermining: No Wound Description Classification: Full Thickness Without Exposed Support Structures Wound Margin: Well defined, not attached Exudate Amount: Medium Exudate Type: Purulent Exudate Color: yellow, brown, green Foul Odor After Cleansing: No Slough/Fibrino Yes Wound Bed Granulation Amount: Large (67-100%) Exposed Structure Granulation Quality: Red Fascia Exposed: No Necrotic Amount: Small (1-33%) Fat Layer (Subcutaneous Tissue) Exposed: Yes Necrotic Quality: Adherent Slough Tendon Exposed: No Muscle Exposed: No Joint Exposed: No Bone Exposed: No Treatment Notes Wound #4 (Lower Leg) Wound Laterality: Left, Medial Cleanser Soap and Water Discharge Instruction: May shower and wash wound with dial antibacterial soap and water prior to dressing change. Wound Cleanser Discharge Instruction: Cleanse  the wound with wound cleanser prior to applying a clean dressing using gauze sponges, not tissue or cotton balls. Peri-Wound Care Skin Prep Discharge Instruction: Use skin prep as directed Topical Primary Dressing Hydrofera Blue Ready Foam, 2.5 x2.5 in Discharge Instruction: Apply to wound bed as instructed Secondary Dressing Zetuvit Plus Silicone Border Dressing 4x4 (in/in) Discharge Instruction: Apply silicone border over primary dressing as directed. Secured With Compression Wrap Compression Stockings Add-Ons Electronic Signature(s) Signed: 11/16/2020 5:29:48 PM By: Lorrin Jackson Entered By: Lorrin Jackson on 11/16/2020 09:39:24 -------------------------------------------------------------------------------- Wound Assessment Details Patient Name: Date of Service: [name] [name], [name] 11/16/2020 8:45 A M Medical Record Number: [id number] Patient Account Number: [id number]  Date of Birth/Sex: Treating RN: 07-21-1926 (87 y.o. [name] Primary Care Zhyon Antenucci: [name] Other Clinician: Referring Lyndall Windt: Treating Zeenat Jeanbaptiste/Extender: [name] in Treatment: 8 Wound Status Wound Number: 5 Primary Venous Leg Ulcer Etiology: Wound Location: Left, Anterior Lower Leg Wound Open Wounding Event: Gradually Appeared Status: Date Acquired: 11/16/2020 Comorbid Cataracts, Sleep Apnea, Arrhythmia, Congestive Heart Failure, Weeks Of Treatment: 0 History: Coronary Artery Disease, Hypertension, Myocardial Infarction, Clustered Wound: No End Stage Renal Disease, Gout, Dementia, Neuropathy Wound Measurements Length: (cm) 0.9 Width: (cm) 0.4 Depth: (cm) 0.1 Area: (cm) 0.283 Volume: (cm) 0.028 % Reduction in Area: % Reduction in Volume: Epithelialization: None Tunneling: No Undermining: No Wound Description Classification: Full Thickness Without Exposed Support Structures Wound Margin: Distinct, outline attached Exudate Amount:  Medium Exudate Type: Serosanguineous Exudate Color: red, brown Foul Odor After Cleansing: No Slough/Fibrino No Wound Bed Granulation Amount: Large (67-100%) Exposed Structure Granulation Quality: Red Fascia Exposed: No Necrotic Amount: None Present (0%) Fat Layer (Subcutaneous Tissue) Exposed: Yes Tendon Exposed: No Muscle Exposed: No Joint Exposed: No Bone Exposed: No Treatment Notes Wound #5 (Lower Leg) Wound Laterality: Left, Anterior Cleanser Soap and Water Discharge Instruction: May shower and wash wound with dial antibacterial soap and water prior to dressing change. Wound Cleanser Discharge Instruction: Cleanse the wound with wound cleanser prior to applying a clean dressing using gauze sponges, not tissue or cotton balls. Peri-Wound Care Skin Prep Discharge Instruction: Use skin prep as directed Topical Primary Dressing Hydrofera Blue Ready Foam, 2.5 x2.5 in Discharge Instruction: Apply to wound bed as instructed Secondary Dressing Zetuvit Plus Silicone Border Dressing 4x4 (in/in) Discharge Instruction: Apply silicone border over primary dressing as directed. Secured With Compression Wrap Compression Stockings Environmental education officer) Signed: 11/16/2020 5:29:48 PM By: Lorrin Jackson Entered By: Lorrin Jackson on 11/16/2020 09:47:34 -------------------------------------------------------------------------------- Vitals Details Patient Name: Date of Service: [name] [name], [name] 11/16/2020 8:45 A M Medical Record Number: [id number] Patient Account Number: [id number] Date of Birth/Sex: Treating RN: 1926/05/17 (87 y.o. [name] Primary Care Madesyn Ast: [name] Other Clinician: Referring Marcos Ruelas: Treating Olivya Sobol/Extender: [name] in Treatment: 8 Vital Signs Time Taken: 09:03 Temperature (F): 97.7 Height (in): 72 Pulse (bpm): 62 Weight (lbs): 223 Respiratory Rate (breaths/min): 20 Body Mass Index (BMI):  30.2 Blood Pressure (mmHg): 140/72 Reference Range: 80 - 120 mg / dl Electronic Signature(s) Signed: 11/16/2020 5:29:48 PM By: Lorrin Jackson Entered By: Lorrin Jackson on 11/16/2020 09:04:25

## 2020-11-16 NOTE — Progress Notes (Addendum)
[name] [name], [name] [name] IMarland Kitchen ([id number]) Visit Report for 11/16/2020 Chief Complaint Document Details Patient Name: Date of Service: [name] [name] ID I. 11/16/2020 8:45 A M Medical Record Number: [id number] Patient Account Number: [id number] Date of Birth/Sex: Treating RN: 04/16/1926 (87 y.o. [name] Primary Care Provider: [name] Other Clinician: Referring Provider: Treating Provider/Extender: Eli Hose in Treatment: 8 Information Obtained from: Patient Chief Complaint Pressure ulcer right heel and left LE and ankle ulcers Electronic Signature(s) Signed: 11/16/2020 9:55:54 AM By: [name] PA-C Previous Signature: 11/16/2020 9:28:35 AM Version By: [name] PA-C Entered By: [name] on 11/16/2020 09:55:53 -------------------------------------------------------------------------------- Debridement Details Patient Name: Date of Service: [name] [name], [name] 11/16/2020 8:45 A M Medical Record Number: [id number] Patient Account Number: [id number] Date of Birth/Sex: Treating RN: 11-Feb-1926 (87 y.o. [name] Primary Care Provider: [name] Other Clinician: Referring Provider: Treating Provider/Extender: Eli Hose in Treatment: 8 Debridement Performed for Assessment: Wound #2 Right,Medial Calcaneus Performed By: Physician [name], PA Debridement Type: Debridement Level of Consciousness (Pre-procedure): Awake and Alert Pre-procedure Verification/Time Out Yes - 09:34 Taken: Start Time: 09:35 Pain Control: Other : Benzocaine T Area Debrided (L x W): otal 4 (cm) x 4 (cm) = 16 (cm) Tissue and other material debrided: Non-Viable, Slough, Subcutaneous, Slough Level: Skin/Subcutaneous Tissue Debridement Description: Excisional Instrument: Curette Bleeding: Minimum Hemostasis Achieved: Pressure End Time: 09:39 Response to Treatment: Procedure was tolerated well Level of Consciousness  (Post- Awake and Alert procedure): Post Debridement Measurements of Total Wound Length: (cm) 4 Stage: Category/Stage III Width: (cm) 4 Depth: (cm) 0.1 Volume: (cm) 1.257 Character of Wound/Ulcer Post Debridement: Stable Post Procedure Diagnosis Same as Pre-procedure Electronic Signature(s) Signed: 11/16/2020 4:59:49 PM By: [name] PA-C Signed: 11/16/2020 5:29:48 PM By: Lorrin Jackson Entered By: Lorrin Jackson on 11/16/2020 09:43:02 -------------------------------------------------------------------------------- Debridement Details Patient Name: Date of Service: [name] [name], [name] 11/16/2020 8:45 A M Medical Record Number: [id number] Patient Account Number: [id number] Date of Birth/Sex: Treating RN: 09-01-1926 (87 y.o. [name] Primary Care Provider: [name] Other Clinician: Referring Provider: Treating Provider/Extender: Eli Hose in Treatment: 8 Debridement Performed for Assessment: Wound #1 Right,Lateral Calcaneus Performed By: Physician [name], PA Debridement Type: Debridement Level of Consciousness (Pre-procedure): Awake and Alert Pre-procedure Verification/Time Out Yes - 09:34 Taken: Start Time: 09:39 Pain Control: Other : Benzocaine T Area Debrided (L x W): otal 0.5 (cm) x 0.5 (cm) = 0.25 (cm) Tissue and other material debrided: Non-Viable, Slough, Subcutaneous, Slough Level: Skin/Subcutaneous Tissue Debridement Description: Excisional Instrument: Curette Bleeding: Minimum Hemostasis Achieved: Pressure End Time: 09:42 Response to Treatment: Procedure was tolerated well Level of Consciousness (Post- Awake and Alert procedure): Post Debridement Measurements of Total Wound Length: (cm) 1 Stage: Category/Stage III Width: (cm) 1.5 Depth: (cm) 0.2 Volume: (cm) 0.236 Character of Wound/Ulcer Post Debridement: Stable Post Procedure Diagnosis Same as Pre-procedure Electronic Signature(s) Signed:  11/16/2020 4:59:49 PM By: [name] PA-C Signed: 11/16/2020 5:29:48 PM By: Lorrin Jackson Entered By: Lorrin Jackson on 11/16/2020 09:45:40 -------------------------------------------------------------------------------- Debridement Details Patient Name: Date of Service: [name] [name], [name] 11/16/2020 8:45 A M Medical Record Number: [id number] Patient Account Number: [id number] Date of Birth/Sex: Treating RN: 1926-05-31 (87 y.o. [name] Primary Care Provider: [name] Other Clinician: Referring Provider: Treating Provider/Extender: [name] in Treatment: 8 Debridement  Performed for Assessment: Wound #3 Left,Lateral Ankle Performed By: Physician [name], PA Debridement Type: Debridement Severity of Tissue Pre Debridement: Fat layer exposed Level of Consciousness (Pre-procedure): Awake and Alert Pre-procedure Verification/Time Out Yes - 09:34 Taken: Start Time: 09:42 Pain Control: Other : Benzocaine T Area Debrided (L x W): otal 1.6 (cm) x 0.4 (cm) = 0.64 (cm) Tissue and other material debrided: Non-Viable, Slough, Subcutaneous, Slough Level: Skin/Subcutaneous Tissue Debridement Description: Excisional Instrument: Curette Bleeding: Minimum Hemostasis Achieved: Pressure End Time: 09:45 Response to Treatment: Procedure was tolerated well Level of Consciousness (Post- Awake and Alert procedure): Post Debridement Measurements of Total Wound Length: (cm) 1.6 Width: (cm) 0.4 Depth: (cm) 0.3 Volume: (cm) 0.151 Character of Wound/Ulcer Post Debridement: Stable Severity of Tissue Post Debridement: Fat layer exposed Post Procedure Diagnosis Same as Pre-procedure Electronic Signature(s) Signed: 11/16/2020 4:59:49 PM By: [name] PA-C Signed: 11/16/2020 5:29:48 PM By: Lorrin Jackson Entered By: Lorrin Jackson on 11/16/2020  09:48:45 -------------------------------------------------------------------------------- Debridement Details Patient Name: Date of Service: [name] [name], [name] 11/16/2020 8:45 A M Medical Record Number: [id number] Patient Account Number: [id number] Date of Birth/Sex: Treating RN: 1926/08/24 (87 y.o. [name] Primary Care Provider: [name] Other Clinician: Referring Provider: Treating Provider/Extender: Eli Hose in Treatment: 8 Debridement Performed for Assessment: Wound #5 Left,Anterior Lower Leg Performed By: Physician [name], PA Debridement Type: Debridement Severity of Tissue Pre Debridement: Fat layer exposed Level of Consciousness (Pre-procedure): Awake and Alert Pre-procedure Verification/Time Out Yes - 09:34 Taken: Start Time: 09:45 Pain Control: Other : Benzocaine T Area Debrided (L x W): otal 0.9 (cm) x 0.4 (cm) = 0.36 (cm) Tissue and other material debrided: Non-Viable, Subcutaneous Level: Skin/Subcutaneous Tissue Debridement Description: Excisional Instrument: Curette Bleeding: Minimum Hemostasis Achieved: Pressure End Time: 09:47 Response to Treatment: Procedure was tolerated well Level of Consciousness (Post- Awake and Alert procedure): Post Debridement Measurements of Total Wound Length: (cm) 0.9 Width: (cm) 0.4 Depth: (cm) 0.1 Volume: (cm) 0.028 Character of Wound/Ulcer Post Debridement: Stable Severity of Tissue Post Debridement: Fat layer exposed Post Procedure Diagnosis Same as Pre-procedure Electronic Signature(s) Signed: 11/16/2020 4:59:49 PM By: [name] PA-C Signed: 11/16/2020 5:29:48 PM By: Lorrin Jackson Entered By: Lorrin Jackson on 11/16/2020 09:50:01 -------------------------------------------------------------------------------- Debridement Details Patient Name: Date of Service: [name] [name], [name] 11/16/2020 8:45 A M Medical Record Number: [id number] Patient Account Number:  [id number] Date of Birth/Sex: Treating RN: [birth year] (87 y.o. [name] Primary Care Provider: [name] Other Clinician: Referring Provider: Treating Provider/Extender: Eli Hose in Treatment: 8 Debridement Performed for Assessment: Wound #4 Left,Medial Lower Leg Performed By: Physician [name], PA Debridement Type: Debridement Level of Consciousness (Pre-procedure): Awake and Alert Pre-procedure Verification/Time Out Yes - 09:34 Taken: Start Time: 09:47 Pain Control: Other : Benzocaine T Area Debrided (L x W): otal 0.5 (cm) x 0.5 (cm) = 0.25 (cm) Tissue and other material debrided: Non-Viable, Subcutaneous, Fibrin/Exudate Level: Skin/Subcutaneous Tissue Debridement Description: Excisional Instrument: Curette Bleeding: Minimum Hemostasis Achieved: Pressure End Time: 09:50 Response to Treatment: Procedure was tolerated well Level of Consciousness (Post- Awake and Alert procedure): Post Debridement Measurements of Total Wound Length: (cm) 0.5 Width: (cm) 0.5 Depth: (cm) 0.9 Volume: (cm) 0.177 Character of Wound/Ulcer Post Debridement: Stable Post Procedure Diagnosis Same as Pre-procedure Electronic Signature(s) Signed: 11/16/2020 4:59:49 PM By: [name] PA-C Signed: 11/16/2020 5:29:48 PM By: Lorrin Jackson Entered By: Lorrin Jackson on 11/16/2020 09:50:53 --------------------------------------------------------------------------------  HPI Details Patient Name: Date of Service: [name] [name] ID I. 11/16/2020 8:45 A M Medical Record Number: [id number] Patient Account Number: [id number] Date of Birth/Sex: Treating RN: [birth year] (87 y.o. [name] Primary Care Provider: [name] Other Clinician: Referring Provider: Treating Provider/Extender: Eli Hose in Treatment: 8 History of Present Illness HPI Description: 09/21/2020 upon evaluation today patient appears to be doing  somewhat poorly in regard to a wound in the right lateral heel location. This fortunately does not appear to be too bad but nonetheless is definitely a spot that we need to work on try to get healed for him. Has been here for quite some time. Fortunately there does not appear to be any evidence of active infection at this time which is great news. With that being said I do believe that he has some evidence here of peripheral vascular disease this is minimal his ABI 0.87. Other than that he does have a history of BPH, chronic kidney disease stage III, and hypertension. Currently he has been using different medication such as Silvadene or antibiotic ointment to try to help treat things. This just is not helping out as efficiently as it should be. Of note patient's history includes that of a triple bypass in 2010, a kyphoplasty at L1 which was done 8 months ago, unfortunately he had a fracture 2 months ago which she sustained a T7/T8 fracture to his spine which ended up with him being in the hospital. It is in that time since then that he developed this wound roughly 2 to 3 months ago is not exactly sure when. He does typically wear compression socks in the 20 to 30 mmHg range. 09/28/2020 upon evaluation today patient presents for follow-up and overall seems to be doing more poorly based on what I am seeing currently. His ABI last time was measured at 0.87 but I am beginning to think this may be falsely elevated due to the fact that there is more eschar and subsequently a concern here of vascular compromise that may be part of her issue. I think that we need to avoid any additional sharp debridement and make a referral to vascular surgery as soon as possible here. Subsequently also went ahead and did discuss the possibility of starting the patient on Santyl instead of the collagen in order to help clean up the surface of the wound this will take the place of sharp debridement currently. 10/19/2020 upon  evaluation today patient's wound actually appears to be doing okay although he has an area on the medial aspect of his heel that is only been showing up for the past couple of days this is more of a blister that has ruptured. The good news is there is no deep wound here which is good. Fortunately there is no signs of active infection at this time also good news. With that being said however the blood flow was not good with an ABI of 0.67 on the right it was around 1 on the left nonetheless I think he does need to see vascular we put that referral in last week on the 12th Dr. Dellia Nims reviewed that for me and made that recommendations. Nonetheless the patient has not heard anything from vascular yet as far as getting this scheduled. I think this needs to be done sooner rather than later as I am concerned about the possibility of a limb threatening situation if we do not get this under control.. 11/16/2020 upon  evaluation today patient presents for follow-up regarding issues that he has been having with his bilateral lower extremities in particular. He in fact still has an issue with his right heel. This appears to be a stage III pressure ulcer at this point there is some slough noted. Subsequently he does have revascularization that is taking place on the right leg as a follow-up later this month in that regard. No repeat ABIs. Subsequently the patient also has venous stasis bilaterally which is also problematic for him at this time. He has some ulcers on the left ankle and left leg that are getting need to be addressed as well. Electronic Signature(s) Signed: 11/16/2020 9:56:09 AM By: [name] PA-C Entered By: [name] on 11/16/2020 09:56:09 -------------------------------------------------------------------------------- Physical Exam Details Patient Name: Date of Service: [name] [name], [name] 11/16/2020 8:45 A M Medical Record Number: [id number] Patient Account Number: [id number] Date of  Birth/Sex: Treating RN: Jan 13, 1926 (87 y.o. [name] Primary Care Provider: [name] Other Clinician: Referring Provider: Treating Provider/Extender: [name] in Treatment: 8 Constitutional Well-nourished and well-hydrated in no acute distress. Respiratory normal breathing without difficulty. Psychiatric this patient is able to make decisions and demonstrates good insight into disease process. Alert and Oriented x 3. pleasant and cooperative. Notes Upon inspection patient's wounds actually did require some sharp debridement to clear away some of the necrotic debris and he tolerated the debridement today without complication he did have some pain but this was minimal compared to where things have been previous. I am hopeful that we will be able to see some things improve now that he actually has restored her improved blood flow on the right leg. I am hopeful in the left leg will be able to do something for him soon to notice feeling some discomfort down in the foot area but again I think he probably has enough to heal at least at this point based on what I am seeing testing wise. Electronic Signature(s) Signed: 11/16/2020 10:29:05 AM By: [name] PA-C Entered By: [name] on 11/16/2020 10:29:05 -------------------------------------------------------------------------------- Physician Orders Details Patient Name: Date of Service: [name] [name], [name] 11/16/2020 8:45 A M Medical Record Number: [id number] Patient Account Number: [id number] Date of Birth/Sex: Treating RN: Oct 05, 1926 (87 y.o. [name] Primary Care Provider: [name] Other Clinician: Referring Provider: Treating Provider/Extender: Eli Hose in Treatment: 8 Verbal / Phone Orders: No Diagnosis Coding ICD-10 Coding Code Description L89.610 Pressure ulcer of right heel, unstageable I73.89 Other specified peripheral vascular  diseases N40.1 Benign prostatic hyperplasia with lower urinary tract symptoms N18.30 Chronic kidney disease, stage 3 unspecified I10 Essential (primary) hypertension Follow-up Appointments ppointment in 2 weeks. Jeri Cos, PA Return A Vascular F/U 11/22/20 Bathing/ Shower/ Hygiene May shower and wash wound with soap and water. - wash wound last in shower with dial antibacterial soap, pat dry. Edema Control - Lymphedema / SCD / Other Elevate legs to the level of the heart or above for 30 minutes daily and/or when sitting, a frequency of: - 3-4 times a day through out the day. Avoid standing for long periods of time. Moisturize legs daily. - both legs every night before bed. Off-Loading Other: - Ensure to float right heel while resting in bed or chair with bunny boots. No not wear sandals to prevent friction. Ensure to wear tennis shoes. Home Health New wound care orders this week; continue  Home Health for wound care. May utilize formulary equivalent dressing for wound treatment orders unless otherwise specified. - Primary dressing changed to Hydrofera Blue Ready, Santyl to left lateral ankle Other Home Health Orders/Instructions: - [facility] Wound Treatment Wound #1 - Calcaneus Wound Laterality: Right, Lateral Cleanser: Normal Saline Every Other Day/30 Days Discharge Instructions: Cleanse the wound with Normal Saline prior to applying a clean dressing using gauze sponges, not tissue or cotton balls. Cleanser: Soap and Water Every Other Day/30 Days Discharge Instructions: May shower and wash wound with dial antibacterial soap and water prior to dressing change. Peri-Wound Care: Skin Prep (Generic) Every Other Day/30 Days Discharge Instructions: Use skin prep as directed Prim Dressing: Hydrofera Blue Ready Foam, 4x5 in (DME) (Generic) Every Other Day/30 Days ary Discharge Instructions: Apply to wound bed as instructed Secondary Dressing: Woven Gauze Sponge, Non-Sterile 4x4 in (DME)  (Generic) Every Other Day/30 Days Discharge Instructions: Apply over primary dressing as directed. Secondary Dressing: ABD Pad, 5x9 (DME) (Generic) Every Other Day/30 Days Discharge Instructions: Apply over primary dressing as directed. Secured With: [employer], 4.5x3.1 (in/yd) Every Other Day/30 Days Discharge Instructions: Secure with Kerlix as directed. Secured With: Paper Tape, 2x10 (in/yd) (DME) (Generic) Every Other Day/30 Days Discharge Instructions: Secure dressing with tape as directed. Wound #2 - Calcaneus Wound Laterality: Right, Medial Cleanser: Normal Saline Every Other Day/30 Days Discharge Instructions: Cleanse the wound with Normal Saline prior to applying a clean dressing using gauze sponges, not tissue or cotton balls. Cleanser: Soap and Water Every Other Day/30 Days Discharge Instructions: May shower and wash wound with dial antibacterial soap and water prior to dressing change. Peri-Wound Care: Skin Prep (Generic) Every Other Day/30 Days Discharge Instructions: Use skin prep as directed Prim Dressing: Hydrofera Blue Ready Foam, 4x5 in (DME) (Generic) Every Other Day/30 Days ary Discharge Instructions: Apply to wound bed as instructed Secondary Dressing: Woven Gauze Sponge, Non-Sterile 4x4 in (DME) (Generic) Every Other Day/30 Days Discharge Instructions: Apply over primary dressing as directed. Secondary Dressing: ABD Pad, 5x9 (DME) (Generic) Every Other Day/30 Days Discharge Instructions: Apply over primary dressing as directed. Secured With: [employer], 4.5x3.1 (in/yd) Every Other Day/30 Days Discharge Instructions: Secure with Kerlix as directed. Secured With: Paper Tape, 2x10 (in/yd) (DME) (Generic) Every Other Day/30 Days Discharge Instructions: Secure dressing with tape as directed. Wound #3 - Ankle Wound Laterality: Left, Lateral Cleanser: Soap and Water Every Other Day/30 Days Discharge Instructions: May shower and wash wound with dial  antibacterial soap and water prior to dressing change. Cleanser: Wound Cleanser Every Other Day/30 Days Discharge Instructions: Cleanse the wound with wound cleanser prior to applying a clean dressing using gauze sponges, not tissue or cotton balls. Peri-Wound Care: Skin Prep (DME) (Generic) Every Other Day/30 Days Discharge Instructions: Use skin prep as directed Prim Dressing: Hydrofera Blue Ready Foam, 2.5 x2.5 in (DME) (Generic) Every Other Day/30 Days ary Discharge Instructions: Apply to wound bed as instructed Prim Dressing: Santyl Ointment Every Other Day/30 Days ary Discharge Instructions: Apply nickel thick amount to wound bed as instructed Secondary Dressing: Zetuvit Plus Silicone Border Dressing 4x4 (in/in) (DME) (Generic) Every Other Day/30 Days Discharge Instructions: Apply silicone border over primary dressing as directed. Wound #4 - Lower Leg Wound Laterality: Left, Medial Cleanser: Soap and Water Every Other Day/30 Days Discharge Instructions: May shower and wash wound with dial antibacterial soap and water prior to dressing change. Cleanser: Wound Cleanser Every Other Day/30 Days Discharge Instructions: Cleanse the wound with wound cleanser prior to  applying a clean dressing using gauze sponges, not tissue or cotton balls. Peri-Wound Care: Skin Prep (DME) (Generic) Every Other Day/30 Days Discharge Instructions: Use skin prep as directed Prim Dressing: Hydrofera Blue Ready Foam, 2.5 x2.5 in (DME) (Generic) Every Other Day/30 Days ary Discharge Instructions: Apply to wound bed as instructed Secondary Dressing: Zetuvit Plus Silicone Border Dressing 4x4 (in/in) (DME) (Generic) Every Other Day/30 Days Discharge Instructions: Apply silicone border over primary dressing as directed. Wound #5 - Lower Leg Wound Laterality: Left, Anterior Cleanser: Soap and Water Every Other Day/30 Days Discharge Instructions: May shower and wash wound with dial antibacterial soap and water prior  to dressing change. Cleanser: Wound Cleanser Every Other Day/30 Days Discharge Instructions: Cleanse the wound with wound cleanser prior to applying a clean dressing using gauze sponges, not tissue or cotton balls. Peri-Wound Care: Skin Prep (DME) (Generic) Every Other Day/30 Days Discharge Instructions: Use skin prep as directed Prim Dressing: Hydrofera Blue Ready Foam, 2.5 x2.5 in (DME) (Generic) Every Other Day/30 Days ary Discharge Instructions: Apply to wound bed as instructed Secondary Dressing: Zetuvit Plus Silicone Border Dressing 4x4 (in/in) (DME) (Generic) Every Other Day/30 Days Discharge Instructions: Apply silicone border over primary dressing as directed. Electronic Signature(s) Signed: 11/16/2020 4:59:49 PM By: [name] PA-C Signed: 11/16/2020 5:29:48 PM By: Lorrin Jackson Entered By: Lorrin Jackson on 11/16/2020 09:55:29 -------------------------------------------------------------------------------- Problem List Details Patient Name: Date of Service: [name] [name], [name] 11/16/2020 8:45 A M Medical Record Number: [id number] Patient Account Number: [id number] Date of Birth/Sex: Treating RN: 09/27/1926 (87 y.o. [name] Primary Care Provider: [name] Other Clinician: Referring Provider: Treating Provider/Extender: Eli Hose in Treatment: 8 Active Problems ICD-10 Encounter Code Description Active Date MDM Diagnosis L89.613 Pressure ulcer of right heel, stage 3 09/21/2020 No Yes I73.89 Other specified peripheral vascular diseases 09/21/2020 No Yes I87.2 Venous insufficiency (chronic) (peripheral) 11/16/2020 No Yes L97.822 Non-pressure chronic ulcer of other part of left lower leg with fat layer 11/16/2020 No Yes exposed L97.312 Non-pressure chronic ulcer of right ankle with fat layer exposed 11/16/2020 No Yes N40.1 Benign prostatic hyperplasia with lower urinary tract symptoms 09/21/2020 No Yes N18.30 Chronic kidney  disease, stage 3 unspecified 09/21/2020 No Yes I10 Essential (primary) hypertension 09/21/2020 No Yes Inactive Problems Resolved Problems Electronic Signature(s) Signed: 11/16/2020 9:55:35 AM By: [name] PA-C Previous Signature: 11/16/2020 9:28:27 AM Version By: [name] PA-C Entered By: [name] on 11/16/2020 09:55:35 -------------------------------------------------------------------------------- Progress Note Details Patient Name: Date of Service: [name] [name], [name] 11/16/2020 8:45 A M Medical Record Number: [id number] Patient Account Number: [id number] Date of Birth/Sex: Treating RN: July 12, 1926 (87 y.o. [name] Primary Care Provider: [name] Other Clinician: Referring Provider: Treating Provider/Extender: Eli Hose in Treatment: 8 Subjective Chief Complaint Information obtained from Patient Pressure ulcer right heel and left LE and ankle ulcers History of Present Illness (HPI) 09/21/2020 upon evaluation today patient appears to be doing somewhat poorly in regard to a wound in the right lateral heel location. This fortunately does not appear to be too bad but nonetheless is definitely a spot that we need to work on try to get healed for him. Has been here for quite some time. Fortunately there does not appear to be any evidence of active infection at this time which is great news. With that being said I do believe that he has some evidence here of peripheral vascular  disease this is minimal his ABI 0.87. Other than that he does have a history of BPH, chronic kidney disease stage III, and hypertension. Currently he has been using different medication such as Silvadene or antibiotic ointment to try to help treat things. This just is not helping out as efficiently as it should be. Of note patient's history includes that of a triple bypass in 2010, a kyphoplasty at L1 which was done 8 months ago, unfortunately he had a  fracture 2 months ago which she sustained a T7/T8 fracture to his spine which ended up with him being in the hospital. It is in that time since then that he developed this wound roughly 2 to 3 months ago is not exactly sure when. He does typically wear compression socks in the 20 to 30 mmHg range. 09/28/2020 upon evaluation today patient presents for follow-up and overall seems to be doing more poorly based on what I am seeing currently. His ABI last time was measured at 0.87 but I am beginning to think this may be falsely elevated due to the fact that there is more eschar and subsequently a concern here of vascular compromise that may be part of her issue. I think that we need to avoid any additional sharp debridement and make a referral to vascular surgery as soon as possible here. Subsequently also went ahead and did discuss the possibility of starting the patient on Santyl instead of the collagen in order to help clean up the surface of the wound this will take the place of sharp debridement currently. 10/19/2020 upon evaluation today patient's wound actually appears to be doing okay although he has an area on the medial aspect of his heel that is only been showing up for the past couple of days this is more of a blister that has ruptured. The good news is there is no deep wound here which is good. Fortunately there is no signs of active infection at this time also good news. With that being said however the blood flow was not good with an ABI of 0.67 on the right it was around 1 on the left nonetheless I think he does need to see vascular we put that referral in last week on the 12th Dr. Dellia Nims reviewed that for me and made that recommendations. Nonetheless the patient has not heard anything from vascular yet as far as getting this scheduled. I think this needs to be done sooner rather than later as I am concerned about the possibility of a limb threatening situation if we do not get this under  control.. 11/16/2020 upon evaluation today patient presents for follow-up regarding issues that he has been having with his bilateral lower extremities in particular. He in fact still has an issue with his right heel. This appears to be a stage III pressure ulcer at this point there is some slough noted. Subsequently he does have revascularization that is taking place on the right leg as a follow-up later this month in that regard. No repeat ABIs. Subsequently the patient also has venous stasis bilaterally which is also problematic for him at this time. He has some ulcers on the left ankle and left leg that are getting need to be addressed as well. Objective Constitutional Well-nourished and well-hydrated in no acute distress. Vitals Time Taken: 9:03 AM, Height: 72 in, Weight: 223 lbs, BMI: 30.2, Temperature: 97.7 F, Pulse: 62 bpm, Respiratory Rate: 20 breaths/min, Blood Pressure: 140/72 mmHg. Respiratory normal breathing without difficulty. Psychiatric this patient is  able to make decisions and demonstrates good insight into disease process. Alert and Oriented x 3. pleasant and cooperative. General Notes: Upon inspection patient's wounds actually did require some sharp debridement to clear away some of the necrotic debris and he tolerated the debridement today without complication he did have some pain but this was minimal compared to where things have been previous. I am hopeful that we will be able to see some things improve now that he actually has restored her improved blood flow on the right leg. I am hopeful in the left leg will be able to do something for him soon to notice feeling some discomfort down in the foot area but again I think he probably has enough to heal at least at this point based on what I am seeing testing wise. Integumentary (Hair, Skin) Wound #1 status is Open. Original cause of wound was Pressure Injury. The date acquired was: 06/08/2020. The wound has been in  treatment 8 weeks. The wound is located on the Right,Lateral Calcaneus. The wound measures 1cm length x 1.5cm width x 0.2cm depth; 1.178cm^2 area and 0.236cm^3 volume. There is Fat Layer (Subcutaneous Tissue) exposed. There is no tunneling or undermining noted. There is a medium amount of serosanguineous drainage noted. The wound margin is distinct with the outline attached to the wound base. There is large (67-100%) red granulation within the wound bed. There is a small (1-33%) amount of necrotic tissue within the wound bed including Adherent Slough. Wound #2 status is Open. Original cause of wound was Pressure Injury. The date acquired was: 10/17/2020. The wound has been in treatment 4 weeks. The wound is located on the Right,Medial Calcaneus. The wound measures 4cm length x 4cm width x 0.1cm depth; 12.566cm^2 area and 1.257cm^3 volume. There is Fat Layer (Subcutaneous Tissue) exposed. There is no tunneling or undermining noted. There is a medium amount of serous drainage noted. The wound margin is distinct with the outline attached to the wound base. There is large (67-100%) red, pink granulation within the wound bed. There is a small (1-33%) amount of necrotic tissue within the wound bed including Adherent Slough. Wound #3 status is Open. Original cause of wound was Gradually Appeared. The date acquired was: 10/12/2020. The wound is located on the Left,Lateral Ankle. The wound measures 1.6cm length x 0.4cm width x 0.3cm depth; 0.503cm^2 area and 0.151cm^3 volume. There is Fat Layer (Subcutaneous Tissue) exposed. There is no tunneling or undermining noted. There is a medium amount of serosanguineous drainage noted. The wound margin is distinct with the outline attached to the wound base. There is small (1-33%) red granulation within the wound bed. There is a large (67-100%) amount of necrotic tissue within the wound bed including Adherent Slough. Wound #4 status is Open. Original cause of wound was  Other Lesion. The date acquired was: 11/16/2020. The wound is located on the Left,Medial Lower Leg. The wound measures 0.5cm length x 0.5cm width x 0.9cm depth; 0.196cm^2 area and 0.177cm^3 volume. There is Fat Layer (Subcutaneous Tissue) exposed. There is no tunneling or undermining noted. There is a medium amount of purulent drainage noted. The wound margin is well defined and not attached to the wound base. There is large (67-100%) red granulation within the wound bed. There is a small (1-33%) amount of necrotic tissue within the wound bed including Adherent Slough. Wound #5 status is Open. Original cause of wound was Gradually Appeared. The date acquired was: 11/16/2020. The wound is located on the Left,Anterior Lower Leg.  The wound measures 0.9cm length x 0.4cm width x 0.1cm depth; 0.283cm^2 area and 0.028cm^3 volume. There is Fat Layer (Subcutaneous Tissue) exposed. There is no tunneling or undermining noted. There is a medium amount of serosanguineous drainage noted. The wound margin is distinct with the outline attached to the wound base. There is large (67-100%) red granulation within the wound bed. There is no necrotic tissue within the wound bed. 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 There was a Excisional Skin/Subcutaneous Tissue Debridement with a total area of 0.25 sq cm performed by [name], PA. With the following instrument(s): Curette to remove  Non-Viable tissue/material. Material removed includes Subcutaneous Tissue and Slough and after achieving pain control using Other (Benzocaine). No specimens were taken. A time out was conducted at 09:34, prior to the start of the procedure. A Minimum amount of bleeding was controlled with Pressure. The procedure was tolerated well. Post Debridement Measurements: 1cm length x 1.5cm width x 0.2cm depth; 0.236cm^3 volume. Post debridement Stage noted as Category/Stage III. Character of Wound/Ulcer Post Debridement is stable. Post procedure Diagnosis Wound #1: Same as Pre-Procedure Wound #2 Pre-procedure diagnosis of Wound #2 is a Pressure Ulcer located on the Right,Medial Calcaneus . There was a Excisional Skin/Subcutaneous Tissue Debridement with a total area of 16 sq cm performed by [name], PA. With the following instrument(s): Curette to remove Non-Viable tissue/material. Material removed includes Subcutaneous Tissue and Slough and after achieving pain control using Other (Benzocaine). No specimens were taken. A time out was conducted at 09:34, prior to the start of the procedure. A Minimum amount of bleeding was controlled with Pressure. The procedure was tolerated well. Post Debridement Measurements: 4cm length x 4cm width x 0.1cm depth; 1.257cm^3 volume. Post debridement Stage noted as Category/Stage III. Character of Wound/Ulcer Post Debridement is stable. Post procedure Diagnosis Wound #2: Same as Pre-Procedure Wound #3 Pre-procedure diagnosis of Wound #3 is a Venous Leg Ulcer located on the Left,Lateral Ankle .Severity of Tissue Pre Debridement is: Fat layer exposed. There was a Excisional Skin/Subcutaneous Tissue Debridement with a total area of 0.64 sq cm performed by [name], PA. With the following instrument(s): Curette to remove Non-Viable tissue/material. Material removed includes Subcutaneous Tissue and Slough and after achieving pain control using Other (Benzocaine).  No specimens were taken. A time out was conducted at 09:34, prior to the start of the procedure. A Minimum amount of bleeding was controlled with Pressure. The procedure was tolerated well. Post Debridement Measurements: 1.6cm length x 0.4cm width x 0.3cm depth; 0.151cm^3 volume. Character of Wound/Ulcer Post Debridement is stable. Severity of Tissue Post Debridement is: Fat layer exposed. Post procedure Diagnosis Wound #3: Same as Pre-Procedure Wound #4 Pre-procedure diagnosis of Wound #4 is an Abscess located on the Left,Medial Lower Leg . There was a Excisional Skin/Subcutaneous Tissue Debridement with a total area of 0.25 sq cm performed by [name], PA. With the following instrument(s): Curette to remove Non-Viable tissue/material. Material removed includes Subcutaneous Tissue and Fibrin/Exudate and after achieving pain control using Other (Benzocaine). No specimens were taken. A  time out was conducted at 09:34, prior to the start of the procedure. A Minimum amount of bleeding was controlled with Pressure. The procedure was tolerated well. Post Debridement Measurements: 0.5cm length x 0.5cm width x 0.9cm depth; 0.177cm^3 volume. Character of Wound/Ulcer Post Debridement is stable. Post procedure Diagnosis Wound #4: Same as Pre-Procedure Wound #5 Pre-procedure diagnosis of Wound #5 is a Venous Leg Ulcer located on the Left,Anterior Lower Leg .Severity of Tissue Pre Debridement is: Fat layer exposed. There was a Excisional Skin/Subcutaneous Tissue Debridement with a total area of 0.36 sq cm performed by [name], PA. With the following instrument(s): Curette to remove Non-Viable tissue/material. Material removed includes Subcutaneous Tissue after achieving pain control using Other (Benzocaine). No specimens were taken. A time out was conducted at 09:34, prior to the start of the procedure. A Minimum amount of bleeding was controlled with Pressure. The procedure was tolerated well.  Post Debridement Measurements: 0.9cm length x 0.4cm width x 0.1cm depth; 0.028cm^3 volume. Character of Wound/Ulcer Post Debridement is stable. Severity of Tissue Post Debridement is: Fat layer exposed. Post procedure Diagnosis Wound #5: Same as Pre-Procedure Plan Follow-up Appointments: Return Appointment in 2 weeks. Jeri Cos, PA Vascular F/U 11/22/20 Bathing/ Shower/ Hygiene: May shower and wash wound with soap and water. - wash wound last in shower with dial antibacterial soap, pat dry. Edema Control - Lymphedema / SCD / Other: Elevate legs to the level of the heart or above for 30 minutes daily and/or when sitting, a frequency of: - 3-4 times a day through out the day. Avoid standing for long periods of time. Moisturize legs daily. - both legs every night before bed. Off-Loading: Other: - Ensure to float right heel while resting in bed or chair with bunny boots. No not wear sandals to prevent friction. Ensure to wear tennis shoes. Home Health: New wound care orders this week; continue Home Health for wound care. May utilize formulary equivalent dressing for wound treatment orders unless otherwise specified. - Primary dressing changed to Hydrofera Blue Ready, Santyl to left lateral ankle Other Home Health Orders/Instructions: - Coffee #1: - Calcaneus Wound Laterality: Right, Lateral Cleanser: Normal Saline Every Other Day/30 Days Discharge Instructions: Cleanse the wound with Normal Saline prior to applying a clean dressing using gauze sponges, not tissue or cotton balls. Cleanser: Soap and Water Every Other Day/30 Days Discharge Instructions: May shower and wash wound with dial antibacterial soap and water prior to dressing change. Peri-Wound Care: Skin Prep (Generic) Every Other Day/30 Days Discharge Instructions: Use skin prep as directed Prim Dressing: Hydrofera Blue Ready Foam, 4x5 in (DME) (Generic) Every Other Day/30 Days ary Discharge Instructions: Apply to  wound bed as instructed Secondary Dressing: Woven Gauze Sponge, Non-Sterile 4x4 in (DME) (Generic) Every Other Day/30 Days Discharge Instructions: Apply over primary dressing as directed. Secondary Dressing: ABD Pad, 5x9 (DME) (Generic) Every Other Day/30 Days Discharge Instructions: Apply over primary dressing as directed. Secured With: [employer], 4.5x3.1 (in/yd) Every Other Day/30 Days Discharge Instructions: Secure with Kerlix as directed. Secured With: Paper T ape, 2x10 (in/yd) (DME) (Generic) Every Other Day/30 Days Discharge Instructions: Secure dressing with tape as directed. WOUND #2: - Calcaneus Wound Laterality: Right, Medial Cleanser: Normal Saline Every Other Day/30 Days Discharge Instructions: Cleanse the wound with Normal Saline prior to applying a clean dressing using gauze sponges, not tissue or cotton balls. Cleanser: Soap and Water Every Other Day/30 Days Discharge Instructions: May shower and wash wound with dial antibacterial soap  and water prior to dressing change. Peri-Wound Care: Skin Prep (Generic) Every Other Day/30 Days Discharge Instructions: Use skin prep as directed Prim Dressing: Hydrofera Blue Ready Foam, 4x5 in (DME) (Generic) Every Other Day/30 Days ary Discharge Instructions: Apply to wound bed as instructed Secondary Dressing: Woven Gauze Sponge, Non-Sterile 4x4 in (DME) (Generic) Every Other Day/30 Days Discharge Instructions: Apply over primary dressing as directed. Secondary Dressing: ABD Pad, 5x9 (DME) (Generic) Every Other Day/30 Days Discharge Instructions: Apply over primary dressing as directed. Secured With: [employer], 4.5x3.1 (in/yd) Every Other Day/30 Days Discharge Instructions: Secure with Kerlix as directed. Secured With: Paper T ape, 2x10 (in/yd) (DME) (Generic) Every Other Day/30 Days Discharge Instructions: Secure dressing with tape as directed. WOUND #3: - Ankle Wound Laterality: Left, Lateral Cleanser: Soap and  Water Every Other Day/30 Days Discharge Instructions: May shower and wash wound with dial antibacterial soap and water prior to dressing change. Cleanser: Wound Cleanser Every Other Day/30 Days Discharge Instructions: Cleanse the wound with wound cleanser prior to applying a clean dressing using gauze sponges, not tissue or cotton balls. Peri-Wound Care: Skin Prep (DME) (Generic) Every Other Day/30 Days Discharge Instructions: Use skin prep as directed Prim Dressing: Hydrofera Blue Ready Foam, 2.5 x2.5 in (DME) (Generic) Every Other Day/30 Days ary Discharge Instructions: Apply to wound bed as instructed Prim Dressing: Santyl Ointment Every Other Day/30 Days ary Discharge Instructions: Apply nickel thick amount to wound bed as instructed Secondary Dressing: Zetuvit Plus Silicone Border Dressing 4x4 (in/in) (DME) (Generic) Every Other Day/30 Days Discharge Instructions: Apply silicone border over primary dressing as directed. WOUND #4: - Lower Leg Wound Laterality: Left, Medial Cleanser: Soap and Water Every Other Day/30 Days Discharge Instructions: May shower and wash wound with dial antibacterial soap and water prior to dressing change. Cleanser: Wound Cleanser Every Other Day/30 Days Discharge Instructions: Cleanse the wound with wound cleanser prior to applying a clean dressing using gauze sponges, not tissue or cotton balls. Peri-Wound Care: Skin Prep (DME) (Generic) Every Other Day/30 Days Discharge Instructions: Use skin prep as directed Prim Dressing: Hydrofera Blue Ready Foam, 2.5 x2.5 in (DME) (Generic) Every Other Day/30 Days ary Discharge Instructions: Apply to wound bed as instructed Secondary Dressing: Zetuvit Plus Silicone Border Dressing 4x4 (in/in) (DME) (Generic) Every Other Day/30 Days Discharge Instructions: Apply silicone border over primary dressing as directed. WOUND #5: - Lower Leg Wound Laterality: Left, Anterior Cleanser: Soap and Water Every Other Day/30  Days Discharge Instructions: May shower and wash wound with dial antibacterial soap and water prior to dressing change. Cleanser: Wound Cleanser Every Other Day/30 Days Discharge Instructions: Cleanse the wound with wound cleanser prior to applying a clean dressing using gauze sponges, not tissue or cotton balls. Peri-Wound Care: Skin Prep (DME) (Generic) Every Other Day/30 Days Discharge Instructions: Use skin prep as directed Prim Dressing: Hydrofera Blue Ready Foam, 2.5 x2.5 in (DME) (Generic) Every Other Day/30 Days ary Discharge Instructions: Apply to wound bed as instructed Secondary Dressing: Zetuvit Plus Silicone Border Dressing 4x4 (in/in) (DME) (Generic) Every Other Day/30 Days Discharge Instructions: Apply silicone border over primary dressing as directed. 1. Would recommend that we going continue with the wound care measures as before and the patient is in agreement with the plan. This includes the use of the [facility] that we can use in all wounds except for the left lateral ankle will be using Santyl. 2. I am also can recommend that we have the patient continue currently with appropriate offloading for the heel  I think this is still good he needs to keep dressings as well on this as well. Apparently had 1 provider telling me to send and leave it open to air I do not think that is the best option at all here. We will see patient back for reevaluation in 1 week here in the clinic. If anything worsens or changes patient will contact our office for additional recommendations. Electronic Signature(s) Signed: 11/16/2020 10:31:00 AM By: [name] PA-C Entered By: [name] on 11/16/2020 10:30:59 -------------------------------------------------------------------------------- SuperBill Details Patient Name: Date of Service: [name] [name], [name] 11/16/2020 Medical Record Number: [id number] Patient Account Number: [id number] Date of Birth/Sex: Treating RN: Dec 09, 1926 (87  y.o. 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 N40.1 Benign prostatic hyperplasia with lower urinary tract symptoms N18.30 Chronic kidney disease, stage 3 unspecified I10 Essential (primary) hypertension Facility Procedures CPT4 Code: [id number] Description: 27614 - DEB SUBQ TISSUE 20 SQ CM/< ICD-10 Diagnosis Description L89.613 Pressure ulcer of right heel, stage 3 L97.822 Non-pressure chronic ulcer of other part of left lower leg with fat layer expos Modifier: ed Quantity: 1 Physician Procedures : CPT4 Code Description Modifier [id number] - WC PHYS LEVEL 4 - EST PT 25 ICD-10 Diagnosis Description L89.613 Pressure ulcer of right heel, stage 3 I73.89 Other specified peripheral vascular diseases L97.822 Non-pressure chronic ulcer of other part  of left lower leg with fat layer exposed I87.2 Venous insufficiency (chronic) (peripheral) Quantity: 1 : [id number] - WC PHYS SUBQ TISS 20 SQ CM ICD-10 Diagnosis Description L89.613 Pressure ulcer of right heel, stage 3 L97.822 Non-pressure chronic ulcer of other part of left lower leg with fat layer exposed Quantity: 1 Electronic Signature(s) Signed: 11/16/2020 10:31:31 AM By: [name] PA-C Previous Signature: 11/16/2020 10:19:22 AM Version By: Lorrin Jackson Entered By: [name] on 11/16/2020 10:31:30

## 2020-11-17 DIAGNOSIS — L89610 Pressure ulcer of right heel, unstageable: Secondary | ICD-10-CM | POA: Diagnosis not present

## 2020-11-18 ENCOUNTER — Other Ambulatory Visit: Payer: Self-pay | Admitting: Cardiology

## 2020-11-18 DIAGNOSIS — G4733 Obstructive sleep apnea (adult) (pediatric): Secondary | ICD-10-CM | POA: Diagnosis not present

## 2020-11-18 DIAGNOSIS — I259 Chronic ischemic heart disease, unspecified: Secondary | ICD-10-CM | POA: Diagnosis not present

## 2020-11-22 ENCOUNTER — Ambulatory Visit (INDEPENDENT_AMBULATORY_CARE_PROVIDER_SITE_OTHER): Payer: Medicare Other | Admitting: Physician Assistant

## 2020-11-22 ENCOUNTER — Other Ambulatory Visit: Payer: Self-pay

## 2020-11-22 ENCOUNTER — Ambulatory Visit (HOSPITAL_COMMUNITY)
Admission: RE | Admit: 2020-11-22 | Discharge: 2020-11-22 | Disposition: A | Discharge status: Home / Self Care | Payer: Medicare Other | Source: Ambulatory Visit | Attending: Vascular Surgery | Admitting: Vascular Surgery

## 2020-11-22 VITALS — BP 137/63 | HR 62 | Temp 97.4°F | Resp 20 | Ht 73.0 in | Wt 210.0 lb

## 2020-11-22 DIAGNOSIS — I739 Peripheral vascular disease, unspecified: Secondary | ICD-10-CM | POA: Diagnosis not present

## 2020-11-22 DIAGNOSIS — S41111A Laceration without foreign body of right upper arm, initial encounter: Secondary | ICD-10-CM | POA: Insufficient documentation

## 2020-11-22 DIAGNOSIS — N179 Acute kidney failure, unspecified: Secondary | ICD-10-CM | POA: Insufficient documentation

## 2020-11-22 DIAGNOSIS — I714 Abdominal aortic aneurysm, without rupture, unspecified: Secondary | ICD-10-CM | POA: Insufficient documentation

## 2020-11-22 DIAGNOSIS — M545 Low back pain, unspecified: Secondary | ICD-10-CM | POA: Insufficient documentation

## 2020-11-22 NOTE — Progress Notes (Signed)
Peripheral Arterial Disease Follow-Up   VASCULAR SURGERY ASSESSMENT & PLAN:   [name] [name] is a 87 y.o. male presents for follow-up after recent arteriogram.  He was recently admitted to the hospital and was seen in consultation for left heel wound.  He underwent aortogram with right lower extremity runoff.  Posterior tibial guided pedal access was obtained and balloon angioplasty of the PT artery performed.  On prior non invasive studies in January of 2021 he had biphasic flow throughout both legs with ABI of .59 on the right and .85 on the left. No flow limiting stenosis was seen on arterial duplex.   Peripheral arterial disease.  Patient complains of worsening left foot pain.  He is nonambulatory.  His right ABIs have improved and he has triphasic signals.  He has a palpable pulse.  On the left, his pedal pulses are nonpalpable however he has strong Doppler signals.  Sensorimotor intact.  Left ABIs may be falsely elevated due to calcified vessels.  I discussed this in detail with the patient and recommended 83-month follow-up.  Should wound care clinic be concerned about nonhealing wounds, we can reevaluate sooner.  The left ankle wound is somewhat unusual in shape and depth and is not classic for ischemic or diabetic wound.  He is getting excellent wound care at the wound care clinic and recommend continuing this.  Continue optimal medical management of hypertension and follow-up with primary care physician. Encouraged continuation of complete smoking cessation. Continue the following medications: Aspirin.  The patient is unsure if he is continuing on a statin medication or not.  He is on rivaroxaban for history of atrial fibrillation. Follow-up in 3 months with bilateral lower extremity arterial duplex and ABIs.  SUBJECTIVE:   The patient is under care for lower extremity wounds at the wound care clinic.  He does not ambulate secondary to chronic vertebral body compression.  He complains  today of left foot pain and tingling.  PHYSICAL EXAM:   Vitals:   11/22/20 0946  BP: 137/63  Pulse: 62  Resp: 20  Temp: (!) 97.4 F (36.3 C)  TempSrc: Temporal  SpO2: 96%  Weight: 210 lb (95.3 kg)  Height: 6\' 1"  (1.854 m)    General appearance: Well-developed, well-nourished in no apparent distress Neurologic: Alert and oriented x 4. Cardiovascular: Heart rate regular, rhythm irregular.   Extremities: He has 2+ pitting edema of both lower extremities.  Right medial heel wound shallow with beefy red base.  He has an approximately 1 to 2 cm linear ulcer of the left lateral ankle.  Foam border dressings are covering left lower leg wounds. Pulse exam: 2+ palpable right dorsalis pedis, posterior tibial pulses.  Biphasic left dorsalis pedis and peroneal artery Doppler signals.  Posterior tibial artery signal is monophasic.     NON-INVASIVE VASCULAR STUDIES   11/22/2020 Left ABIs may be falsely elevated due to calcified vessels.  Right ABIs appear increased. Left ABIs appear essentially unchanged  compared to prior study on 10/10/2020.     Summary:  Right: Resting right ankle-brachial index is within normal range. No  evidence of significant right lower extremity arterial disease. The right  toe-brachial index is abnormal.   Left: Resting left ankle-brachial index is within normal range. No  evidence of significant left lower extremity arterial disease. The left  toe-brachial index is abnormal.       *See table(s) above for measurements and observations.       Preliminary    PROBLEM LIST:  The patient's past medical history, past surgical history, family history, social history, allergy list and medication list are reviewed.   CURRENT MEDS:    Current Outpatient Medications:    acetaminophen (TYLENOL) 500 MG tablet, Take 2 tablets (1,000 mg total) by mouth 3 (three) times daily. For 3 to 5 days and then change to 1000 mg 3 times daily as needed for pain. (Patient  taking differently: Take 500 mg by mouth as needed for mild pain.), Disp: , Rfl:    amiodarone (PACERONE) 200 MG tablet, Take 1 tablet (200 mg total) by mouth daily., Disp: 90 tablet, Rfl: 3   aspirin EC 81 MG EC tablet, Take 1 tablet (81 mg total) by mouth daily. Swallow whole., Disp: 30 tablet, Rfl: 11   atorvastatin (LIPITOR) 40 MG tablet, Take 1 tablet (40 mg total) by mouth daily., Disp: 30 tablet, Rfl: 2   cholecalciferol (VITAMIN D3) 25 MCG (1000 UNIT) tablet, Take 1,000 Units by mouth daily. , Disp: , Rfl:    cyclobenzaprine (FLEXERIL) 10 MG tablet, Take 10 mg by mouth as needed for muscle spasms., Disp: , Rfl:    docusate sodium (COLACE) 100 MG capsule, Take 1 capsule (100 mg total) by mouth 2 (two) times daily., Disp: , Rfl:    EPINEPHrine 0.3 mg/0.3 mL IJ SOAJ injection, Inject 0.3 mLs (0.3 mg total) into the muscle as needed for anaphylaxis. (Patient taking differently: Inject 0.3 mg into the muscle once as needed for anaphylaxis.), Disp: 2 each, Rfl: 2   ezetimibe (ZETIA) 10 MG tablet, Take 10 mg by mouth daily., Disp: , Rfl:    finasteride (PROSCAR) 5 MG tablet, Take 5 mg by mouth daily., Disp: , Rfl:    fish oil-omega-3 fatty acids 1000 MG capsule, Take 1 g by mouth daily. , Disp: , Rfl:    fluticasone (FLONASE) 50 MCG/ACT nasal spray, Place 1 spray into both nostrils daily as needed for allergies., Disp: , Rfl:    gabapentin (NEURONTIN) 100 MG capsule, Take 100 mg by mouth 3 (three) times daily., Disp: , Rfl:    galantamine (RAZADYNE ER) 8 MG 24 hr capsule, Take 8 mg by mouth Daily., Disp: , Rfl:    HONEY BEE VENOM IJ, Inject 2 each as directed See admin instructions. Every eight weeks, Disp: , Rfl:    hydrALAZINE (APRESOLINE) 25 MG tablet, Take 25 mg by mouth 2 (two) times daily., Disp: , Rfl:    isosorbide mononitrate (IMDUR) 60 MG 24 hr tablet, TAKE 1 TABLET(60 MG) BY MOUTH DAILY AFTER SUPPER (Patient taking differently: Take 60 mg by mouth daily.), Disp: 90 tablet, Rfl: 1    leflunomide (ARAVA) 10 MG tablet, Take 10 mg by mouth daily., Disp: , Rfl:    magnesium gluconate (MAGONATE) 500 MG tablet, Take 500 mg by mouth daily., Disp: , Rfl:    memantine (NAMENDA) 10 MG tablet, Take 10 mg by mouth 2 (two) times daily., Disp: , Rfl:    metoprolol succinate (TOPROL-XL) 25 MG 24 hr tablet, Take 1 tablet (25 mg total) by mouth daily as needed (for high heart rate, consistently greater than 110/min.)., Disp: , Rfl:    nitroGLYCERIN (NITROSTAT) 0.4 MG SL tablet, Place 1 tablet (0.4 mg total) under the tongue every 5 (five) minutes x 3 doses as needed for chest pain., Disp: 30 tablet, Rfl: 3   pantoprazole (PROTONIX) 40 MG tablet, Take 40 mg by mouth every other day. , Disp: , Rfl: 4   polyethylene glycol (MIRALAX / GLYCOLAX) 17  g packet, Take 17 g by mouth 2 (two) times daily., Disp: , Rfl:    potassium chloride (MICRO-K) 10 MEQ CR capsule, TAKE ONE CAPSULE BY MOUTH DAILY WITH LASIX (Patient taking differently: Take 10 mEq by mouth daily.), Disp: 90 capsule, Rfl: 1   PRESCRIPTION MEDICATION, Inhale 1 each into the lungs at bedtime. Cpap, Disp: , Rfl:    Psyllium Husk POWD, Take 1 Scoop by mouth daily. , Disp: , Rfl:    Rivaroxaban (XARELTO) 15 MG TABS tablet, Take 15 mg by mouth daily with supper., Disp: , Rfl:    torsemide (DEMADEX) 20 MG tablet, Take 2 tablets (40 mg total) by mouth daily., Disp: 180 tablet, Rfl: 1   triamcinolone cream (KENALOG) 0.1 %, Apply 1 application topically daily as needed (skin irritation)., Disp: , Rfl:    vitamin B-12 (CYANOCOBALAMIN) 1000 MCG tablet, Take 1,000 mcg by mouth daily. , Disp: , Rfl:    REVIEW OF SYSTEMS:   [X]  denotes positive finding, [ ]  denotes negative finding Cardiac  Comments:  Chest pain or chest pressure:    Shortness of breath upon exertion:    Short of breath when lying flat:    Irregular heart rhythm:        Vascular    Pain in calf, thigh, or hip brought on by ambulation:    Pain in feet at night that wakes you up  from your sleep:     Blood clot in your veins:    Leg swelling:  x       Pulmonary    Oxygen at home:    Productive cough:     Wheezing:         Neurologic    Sudden weakness in arms or legs:     Sudden numbness in arms or legs:     Sudden onset of difficulty speaking or slurred speech:    Temporary loss of vision in one eye:     Problems with dizziness:         Gastrointestinal    Blood in stool:     Vomited blood:         Genitourinary    Burning when urinating:     Blood in urine:        Psychiatric    Major depression:         Hematologic    Bleeding problems:    Problems with blood clotting too easily:        Skin    Rashes or ulcers: x       Constitutional    Fever or chills:     Barbie Banner, PA-C  Office: [phone] 11/22/2020 Dr. Stanford Breed

## 2020-11-23 ENCOUNTER — Telehealth: Payer: Self-pay | Admitting: *Deleted

## 2020-11-23 NOTE — Telephone Encounter (Signed)
Pt called to ask if he could walk with PT and was concerned about his right heel wound. Told pt that we would encourage walking. He verbalized understanding and will let his physical therapist know.

## 2020-11-28 ENCOUNTER — Other Ambulatory Visit: Payer: Self-pay

## 2020-11-28 ENCOUNTER — Ambulatory Visit (INDEPENDENT_AMBULATORY_CARE_PROVIDER_SITE_OTHER): Payer: Medicare Other

## 2020-11-28 DIAGNOSIS — T63441D Toxic effect of venom of bees, accidental (unintentional), subsequent encounter: Secondary | ICD-10-CM

## 2020-11-28 DIAGNOSIS — I739 Peripheral vascular disease, unspecified: Secondary | ICD-10-CM

## 2020-11-30 ENCOUNTER — Encounter (HOSPITAL_BASED_OUTPATIENT_CLINIC_OR_DEPARTMENT_OTHER): Payer: Medicare Other | Admitting: Physician Assistant

## 2020-12-05 ENCOUNTER — Encounter (HOSPITAL_COMMUNITY): Payer: Self-pay

## 2020-12-05 ENCOUNTER — Other Ambulatory Visit (HOSPITAL_COMMUNITY): Payer: Medicare Other

## 2020-12-05 ENCOUNTER — Encounter (HOSPITAL_COMMUNITY): Payer: Self-pay | Admitting: Physician Assistant

## 2020-12-07 ENCOUNTER — Encounter (HOSPITAL_BASED_OUTPATIENT_CLINIC_OR_DEPARTMENT_OTHER): Payer: Medicare Other | Admitting: Physician Assistant

## 2020-12-09 DIAGNOSIS — R338 Other retention of urine: Secondary | ICD-10-CM | POA: Diagnosis not present

## 2020-12-12 DIAGNOSIS — M8008XD Age-related osteoporosis with current pathological fracture, vertebra(e), subsequent encounter for fracture with routine healing: Secondary | ICD-10-CM | POA: Diagnosis not present

## 2020-12-12 DIAGNOSIS — I130 Hypertensive heart and chronic kidney disease with heart failure and stage 1 through stage 4 chronic kidney disease, or unspecified chronic kidney disease: Secondary | ICD-10-CM | POA: Diagnosis not present

## 2020-12-12 DIAGNOSIS — N401 Enlarged prostate with lower urinary tract symptoms: Secondary | ICD-10-CM | POA: Diagnosis not present

## 2020-12-12 DIAGNOSIS — L89611 Pressure ulcer of right heel, stage 1: Secondary | ICD-10-CM | POA: Diagnosis not present

## 2020-12-12 DIAGNOSIS — N1831 Chronic kidney disease, stage 3a: Secondary | ICD-10-CM | POA: Diagnosis not present

## 2020-12-12 DIAGNOSIS — I480 Paroxysmal atrial fibrillation: Secondary | ICD-10-CM | POA: Diagnosis not present

## 2020-12-12 DIAGNOSIS — I5032 Chronic diastolic (congestive) heart failure: Secondary | ICD-10-CM | POA: Diagnosis not present

## 2020-12-12 DIAGNOSIS — D631 Anemia in chronic kidney disease: Secondary | ICD-10-CM | POA: Diagnosis not present

## 2020-12-12 DIAGNOSIS — N179 Acute kidney failure, unspecified: Secondary | ICD-10-CM | POA: Diagnosis not present

## 2020-12-12 DIAGNOSIS — N390 Urinary tract infection, site not specified: Secondary | ICD-10-CM | POA: Diagnosis not present

## 2020-12-12 DIAGNOSIS — R338 Other retention of urine: Secondary | ICD-10-CM | POA: Diagnosis not present

## 2020-12-12 DIAGNOSIS — I5021 Acute systolic (congestive) heart failure: Secondary | ICD-10-CM | POA: Diagnosis not present

## 2020-12-12 DIAGNOSIS — B965 Pseudomonas (aeruginosa) (mallei) (pseudomallei) as the cause of diseases classified elsewhere: Secondary | ICD-10-CM | POA: Diagnosis not present

## 2020-12-12 DIAGNOSIS — M353 Polymyalgia rheumatica: Secondary | ICD-10-CM | POA: Diagnosis not present

## 2020-12-12 DIAGNOSIS — N39498 Other specified urinary incontinence: Secondary | ICD-10-CM | POA: Diagnosis not present

## 2020-12-12 DIAGNOSIS — S81802D Unspecified open wound, left lower leg, subsequent encounter: Secondary | ICD-10-CM | POA: Diagnosis not present

## 2020-12-13 ENCOUNTER — Ambulatory Visit: Payer: Medicare Other | Admitting: Allergy & Immunology

## 2020-12-14 ENCOUNTER — Encounter (HOSPITAL_BASED_OUTPATIENT_CLINIC_OR_DEPARTMENT_OTHER): Payer: Medicare Other | Admitting: Physician Assistant

## 2020-12-16 DIAGNOSIS — I129 Hypertensive chronic kidney disease with stage 1 through stage 4 chronic kidney disease, or unspecified chronic kidney disease: Secondary | ICD-10-CM | POA: Diagnosis not present

## 2020-12-16 DIAGNOSIS — D649 Anemia, unspecified: Secondary | ICD-10-CM | POA: Diagnosis not present

## 2020-12-16 DIAGNOSIS — I480 Paroxysmal atrial fibrillation: Secondary | ICD-10-CM | POA: Diagnosis not present

## 2020-12-16 DIAGNOSIS — E785 Hyperlipidemia, unspecified: Secondary | ICD-10-CM | POA: Diagnosis not present

## 2020-12-18 DIAGNOSIS — I259 Chronic ischemic heart disease, unspecified: Secondary | ICD-10-CM | POA: Diagnosis not present

## 2020-12-18 DIAGNOSIS — G4733 Obstructive sleep apnea (adult) (pediatric): Secondary | ICD-10-CM | POA: Diagnosis not present

## 2020-12-19 DIAGNOSIS — D649 Anemia, unspecified: Secondary | ICD-10-CM | POA: Diagnosis not present

## 2020-12-20 ENCOUNTER — Encounter: Payer: Self-pay | Admitting: Allergy & Immunology

## 2020-12-20 ENCOUNTER — Ambulatory Visit (INDEPENDENT_AMBULATORY_CARE_PROVIDER_SITE_OTHER): Payer: Medicare Other | Admitting: Allergy & Immunology

## 2020-12-20 ENCOUNTER — Other Ambulatory Visit: Payer: Self-pay

## 2020-12-20 VITALS — BP 102/58 | HR 50 | Temp 97.6°F | Resp 24 | Ht 72.0 in | Wt 210.0 lb

## 2020-12-20 DIAGNOSIS — T63441D Toxic effect of venom of bees, accidental (unintentional), subsequent encounter: Secondary | ICD-10-CM | POA: Diagnosis not present

## 2020-12-20 NOTE — Progress Notes (Signed)
FOLLOW UP  Date of Service/Encounter:  12/20/20   Assessment:   Toxic effect of venom - on VIT (on injections every 8 weeks)  Plan/Recommendations:   Venom hypersensitivity - We are going to get some lab work to see if you even have allergy levels to the stinging insects any longer. - We will call you in 1-2 weeks with the results of the testing.  - EpiPen is up to date.   2. Return in about 1 year (around 12/20/2021).   Subjective:   [name] [name] is a 87 y.o. male presenting today for follow up of  Chief Complaint  Patient presents with   Other    Check up. Runny nose due to the cold weather. Just got over [place] has a history of the following: Patient Active Problem List   Diagnosis Date Noted   Abdominal aortic aneurysm without rupture 11/22/2020   Acute renal failure syndrome ([place]) 11/22/2020   Low back pain 11/22/2020   Skin tear of right upper arm without complication 42/59/5638   Pressure injury of skin 10/23/2020   Urinary tract infection with hematuria 10/22/2020   Encephalopathy 10/22/2020   AKI (acute kidney injury) ([place])    Overweight (BMI 25.0-29.9) 08/19/2020   Vertebral fracture, osteoporotic ([place]) 08/18/2020   T8 vertebral fracture ([place]) 08/17/2020   Thrombocytopenia ([place]) 08/17/2020   CKD (chronic kidney disease) stage 3, GFR 30-59 ml/min ([facility]) 08/17/2020   Elevated troponin 08/17/2020   Pain due to onychomycosis of toenails of both feet 07/27/2020   Callus of heel 07/27/2020   Bifascicular block 11/11/2019   Acute on chronic diastolic heart failure ([place]) 11/10/2019   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   Severe aortic stenosis 09/29/2019   Aortic valve disorder 75/64/3329   Systolic heart failure ([place]) 03/11/2019   Thrombophilia ([place]) 02/26/2019   Muscle pain 02/11/2019   Weakness 02/06/2019   Polymyalgia rheumatica ([place]) 02/06/2019   Shoulder joint pain 02/02/2019   Anemia 01/26/2019   Malignant  melanoma of skin ([place]) 01/26/2019   Pain in right leg 01/26/2019   DDD (degenerative disc disease), cervical 05/28/2018   Encounter for general adult medical examination without abnormal findings 11/19/2017   Carotid artery occlusion 07/03/2017   Hearing loss 07/03/2017   Fall 06/19/2017   Injury of upper extremity 06/19/2017   Neck pain 06/19/2017   Headache 06/19/2017   Visual disturbance 06/19/2017   Ganglion of hand 05/08/2017   Dilatation of aorta ([place]) 02/14/2017   Plantar fascial fibromatosis 02/14/2017   Cardiac murmur, unspecified 02/27/2016   Benign neoplasm of colon 08/24/2015   Dementia ([place]) 08/24/2015   Influenza due to unidentified influenza virus with other respiratory manifestations 03/15/2015   Epistaxis 12/01/2014   OSA (obstructive sleep apnea) 11/18/2014   Gastro-esophageal reflux disease with esophagitis 10/29/2014   Angina pectoris ([place]) 05/04/2014   Abnormal feces 07/31/2013   Other specified disorders of gingiva and edentulous alveolar ridge 07/31/2013   Spontaneous ecchymosis 10/10/2012   Thoracic ascending aortic aneurysm 09/29/2012   Microscopic hematuria 04/11/2012   Proteinuria 04/11/2012   S/P laparoscopic cholecystectomy 04/09/2012   S/P CABG x 3 03/06/2012   S/P Maze operation for atrial fibrillation 03/06/2012   Paroxysmal atrial fibrillation ([place]) 02/29/2012   Bitten or stung by nonvenomous insect and other nonvenomous arthropods, initial encounter 12/03/2011   Depression screening 10/17/2011   CAD (coronary artery disease), native coronary artery    Hyperlipidemia    Hypertension  Chronic venous insufficiency    Gout    Benign prostatic hyperplasia    Post-traumatic stress disorder 03/13/2011   Pain in left leg 12/08/2010   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   Fatigue 08/08/2010   Retention of urine 04/18/2010   Constipation 07/05/2009   Kidney stone 07/05/2009   ED (erectile dysfunction) of organic origin 12/14/2008   Impaired  fasting glucose 12/14/2008   Vitamin B deficiency 12/14/2008    History obtained from: chart review and patient.  [name] [name] is a 87 y.o. male presenting for a follow up visit.  He has a longstanding history of venom hypersensitivity and has been getting his venom shots for several decades now.  He reports that he first got them in the late 1980s.  He currently gets mixed vespid as well as wasp every 8 weeks.  He was last seen 2 years ago for regular office visit by one of our nurse practitioners.  In the interim, he has continued to get his shots every 8 weeks without fail.  Since last visit, he has done well.  He is accompanied today by his wife.  His initial reaction was nearly "dying" the first time he was stung in the 1980s.  He was seen by [place] and started on venom shots shortly thereafter.  He had another sting while he was building up in his venom immunotherapy and did fine just with epinephrine administration only.  6 years ago was his last staying and he was done and had no reactions at all.  He is not really around stinging insects at all now in his current state.  He is wheelchair-bound and does not spend a lot of time outdoors.  I do not spend a lot of time outdoors.  He is wondering if he can stop his venom immunotherapy.  He has not had repeat testing in quite some time.  He was last around 5 or 6 years ago.  His son-in-law does have beehives, but [name] [name] does not spend any time with them.  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  He spent most of his [place]  Otherwise, there have been no changes to his past medical history, surgical history, family history, or social history.    Review of Systems  Constitutional: Negative.  Negative for chills, fever, malaise/fatigue and weight loss.  HENT: Negative.  Negative for congestion, ear discharge and ear pain.   Eyes:  Negative for pain, discharge and redness.  Respiratory:  Negative for cough, sputum  production, shortness of breath and wheezing.   Cardiovascular: Negative.  Negative for chest pain and palpitations.  Gastrointestinal:  Negative for abdominal pain, heartburn, nausea and vomiting.  Skin: Negative.  Negative for itching and rash.  Neurological:  Negative for dizziness and headaches.  Endo/Heme/Allergies:  Negative for environmental allergies. Does not bruise/bleed easily.      Objective:   Blood pressure (!) 102/58, pulse (!) 50, temperature 97.6 F (36.4 C), temperature source Temporal, resp. rate (!) 24, height 6' (1.829 m), weight 210 lb (95.3 kg), SpO2 98 %. Body mass index is 28.48 kg/m.   Physical Exam:  Physical Exam Vitals reviewed.  Constitutional:      Appearance: He is well-developed.     Comments: Wheelchair-bound.  HENT:     Head: Normocephalic and atraumatic.     Right Ear: Tympanic membrane, ear canal and external ear normal.     Left Ear: Tympanic membrane, ear canal and external ear normal.  Nose: No nasal deformity, septal deviation, mucosal edema or rhinorrhea.     Right Turbinates: Not enlarged or swollen.     Left Turbinates: Not enlarged or swollen.     Right Sinus: No maxillary sinus tenderness or frontal sinus tenderness.     Left Sinus: No maxillary sinus tenderness or frontal sinus tenderness.     Mouth/Throat:     Mouth: Mucous membranes are not pale and not dry.     Pharynx: Uvula midline.  Eyes:     General: Lids are normal. No allergic shiner.       Right eye: No discharge.        Left eye: No discharge.     Conjunctiva/sclera: Conjunctivae normal.     Right eye: Right conjunctiva is not injected. No chemosis.    Left eye: Left conjunctiva is not injected. No chemosis.    Pupils: Pupils are equal, round, and reactive to light.  Cardiovascular:     Rate and Rhythm: Normal rate and regular rhythm.     Heart sounds: Normal heart sounds.  Pulmonary:     Effort: Pulmonary effort is normal. No tachypnea, accessory muscle  usage or respiratory distress.     Breath sounds: Normal breath sounds. No wheezing, rhonchi or rales.  Chest:     Chest wall: No tenderness.  Lymphadenopathy:     Cervical: No cervical adenopathy.  Skin:    Coloration: Skin is not pale.     Findings: No abrasion, erythema, petechiae or rash. Rash is not papular, urticarial or vesicular.  Neurological:     Mental Status: He is alert.  Psychiatric:        Behavior: Behavior is cooperative.     Diagnostic studies: none       Salvatore Marvel, MD  [facility]

## 2020-12-20 NOTE — Patient Instructions (Addendum)
Venom hypersensitivity - We are going to get some lab work to see if you even have allergy levels to the stinging insects any longer. - We will call you in 1-2 weeks with the results of the testing.  - EpiPen is up to date.   2. Return in about 1 year (around 12/20/2021).    Please inform us of any Emergency Department visits, hospitalizations, or changes in symptoms. Call us before going to the ED for breathing or allergy symptoms since we might be able to fit you in for a sick visit. Feel free to contact us anytime with any questions, problems, or concerns.  It was a pleasure to meet you and your better half today!  Websites that have reliable patient information: 1. American Academy of Asthma, Allergy, and Immunology: www.aaaai.org 2. Food Allergy Research and Education (FARE): foodallergy.org 3. Mothers of Asthmatics: http://www.asthmacommunitynetwork.org 4. American College of Allergy, Asthma, and Immunology: www.acaai.org   COVID-19 Vaccine Information can be found at: ShippingScam.co.uk For questions related to vaccine distribution or appointments, please email [email or web address] or call [phone].   We realize that you might be concerned about having an allergic reaction to the COVID19 vaccines. To help with that concern, WE ARE OFFERING THE COVID19 VACCINES IN OUR OFFICE! Ask the front desk for dates!     Like [place] on [employer] and Instagram for our latest updates!      A healthy democracy works best when [employer] participate! Make sure you are registered to vote! If you have moved or changed any of your contact information, you will need to get this updated before voting!  In some cases, you MAY be able to register to vote online: [email or web address]

## 2020-12-21 ENCOUNTER — Encounter (HOSPITAL_BASED_OUTPATIENT_CLINIC_OR_DEPARTMENT_OTHER): Payer: Medicare Other | Attending: Physician Assistant | Admitting: Physician Assistant

## 2020-12-21 DIAGNOSIS — Z955 Presence of coronary angioplasty implant and graft: Secondary | ICD-10-CM | POA: Insufficient documentation

## 2020-12-21 DIAGNOSIS — N183 Chronic kidney disease, stage 3 unspecified: Secondary | ICD-10-CM | POA: Diagnosis not present

## 2020-12-21 DIAGNOSIS — L89613 Pressure ulcer of right heel, stage 3: Secondary | ICD-10-CM | POA: Insufficient documentation

## 2020-12-21 DIAGNOSIS — I130 Hypertensive heart and chronic kidney disease with heart failure and stage 1 through stage 4 chronic kidney disease, or unspecified chronic kidney disease: Secondary | ICD-10-CM | POA: Diagnosis not present

## 2020-12-21 DIAGNOSIS — I509 Heart failure, unspecified: Secondary | ICD-10-CM | POA: Diagnosis not present

## 2020-12-21 DIAGNOSIS — L97312 Non-pressure chronic ulcer of right ankle with fat layer exposed: Secondary | ICD-10-CM | POA: Diagnosis not present

## 2020-12-21 DIAGNOSIS — L97822 Non-pressure chronic ulcer of other part of left lower leg with fat layer exposed: Secondary | ICD-10-CM | POA: Insufficient documentation

## 2020-12-21 DIAGNOSIS — I872 Venous insufficiency (chronic) (peripheral): Secondary | ICD-10-CM | POA: Insufficient documentation

## 2020-12-21 DIAGNOSIS — I70234 Atherosclerosis of native arteries of right leg with ulceration of heel and midfoot: Secondary | ICD-10-CM | POA: Insufficient documentation

## 2020-12-21 DIAGNOSIS — L02416 Cutaneous abscess of left lower limb: Secondary | ICD-10-CM | POA: Diagnosis not present

## 2020-12-21 DIAGNOSIS — L97322 Non-pressure chronic ulcer of left ankle with fat layer exposed: Secondary | ICD-10-CM | POA: Diagnosis not present

## 2020-12-21 NOTE — Progress Notes (Addendum)
BRYN, PERKIN IMarland Kitchen ([id number]) Visit Report for 12/21/2020 Chief Complaint Document Details Patient Name: Date of Service: [name] [name] ID I. 12/21/2020 2:15 PM Medical Record Number: [id number] Patient Account Number: [id number] Date of Birth/Sex: Treating RN: [birth year] (87 y.o. [name] [name] Primary Care Provider: Jerlyn [name] Other Clinician: Referring Provider: Treating Provider/Extender: [name] [name] in Treatment: 13 Information Obtained from: Patient Chief Complaint Pressure ulcer right heel and left LE and ankle ulcers Electronic Signature(s) Signed: 12/21/2020 2:43:36 PM By: [name] PA-C Entered By: [name] on 12/21/2020 14:43:36 -------------------------------------------------------------------------------- Debridement Details Patient Name: Date of Service: [name] [name], [name] [name] ID I. 12/21/2020 2:15 PM Medical Record Number: [id number] Patient Account Number: [id number] Date of Birth/Sex: Treating RN: [birth year] (87 y.o. [name] [name] Primary Care Provider: Jerlyn [name] Other Clinician: Referring Provider: Treating Provider/Extender: [name] [name] in Treatment: 13 Debridement Performed for Assessment: Wound #1 Right,Lateral Calcaneus Performed By: Clinician [name] Pilling, RN Debridement Type: Chemical/Enzymatic/Mechanical Agent Used: Santyl Level of Consciousness (Pre-procedure): Awake and Alert Pre-procedure Verification/Time Out No Taken: Bleeding: None Response to Treatment: Procedure was tolerated well Level of Consciousness (Post- Awake and Alert procedure): Post Debridement Measurements of Total Wound Length: (cm) 0.8 Stage: Category/Stage III Width: (cm) 0.7 Depth: (cm) 0.3 Volume: (cm) 0.132 Character of Wound/Ulcer Post Debridement: Requires Further Debridement Post Procedure Diagnosis Same as Pre-procedure Electronic Signature(s) Signed: 12/21/2020 5:18:31 PM By: [name]  PA-C Signed: 12/21/2020 6:10:28 PM By: [name] Pilling RN, BSN Entered By: [name] [name] on 12/21/2020 15:28:25 -------------------------------------------------------------------------------- Debridement Details Patient Name: Date of Service: [name] [name], [name] [name] ID I. 12/21/2020 2:15 PM Medical Record Number: [id number] Patient Account Number: [id number] Date of Birth/Sex: Treating RN: [birth year] (87 y.o. [name] [name] Primary Care Provider: Jerlyn [name] Other Clinician: Referring Provider: Treating Provider/Extender: [name] [name] in Treatment: 13 Debridement Performed for Assessment: Wound #2 Right,Medial Calcaneus Performed By: Clinician [name] Pilling, RN Debridement Type: Chemical/Enzymatic/Mechanical Agent Used: Santyl Level of Consciousness (Pre-procedure): Awake and Alert Pre-procedure Verification/Time Out No Taken: Bleeding: None Response to Treatment: Procedure was tolerated well Level of Consciousness (Post- Awake and Alert procedure): Post Debridement Measurements of Total Wound Length: (cm) 2.2 Stage: Category/Stage III Width: (cm) 4 Depth: (cm) 0.1 Volume: (cm) 0.691 Character of Wound/Ulcer Post Debridement: Requires Further Debridement Post Procedure Diagnosis Same as Pre-procedure Electronic Signature(s) Signed: 12/21/2020 5:18:31 PM By: [name] PA-C Signed: 12/21/2020 6:10:28 PM By: [name] Pilling RN, BSN Entered By: [name] [name] on 12/21/2020 15:29:28 -------------------------------------------------------------------------------- Debridement Details Patient Name: Date of Service: [name] [name], [name] [name] ID I. 12/21/2020 2:15 PM Medical Record Number: [id number] Patient Account Number: [id number] Date of Birth/Sex: Treating RN: Oct 12, 1926 (87 y.o. [name] [name] Primary Care Provider: Jerlyn [name] Other Clinician: Referring Provider: Treating Provider/Extender: [name] [name] in Treatment: 13 Debridement  Performed for Assessment: Wound #3 Left,Lateral Ankle Performed By: Clinician [name] Pilling, RN Debridement Type: Chemical/Enzymatic/Mechanical Agent Used: Santyl Severity of Tissue Pre Debridement: Fat layer exposed Level of Consciousness (Pre-procedure): Awake and Alert Pre-procedure Verification/Time Out No Taken: Bleeding: None Response to Treatment: Procedure was tolerated well Level of Consciousness (Post- Awake and Alert procedure): Post Debridement Measurements of Total Wound Length: (cm) 1.8 Width: (cm) 1.3 Depth: (cm) 0.3 Volume: (cm) 0.551 Character of Wound/Ulcer Post Debridement: Requires Further Debridement Severity of Tissue Post Debridement: Fat layer exposed Post Procedure Diagnosis Same as Pre-procedure Electronic Signature(s) Signed:  12/21/2020 5:18:31 PM By: [name] PA-C Signed: 12/21/2020 6:10:28 PM By: [name] Pilling RN, BSN Entered By: [name] [name] on 12/21/2020 15:29:56 -------------------------------------------------------------------------------- Debridement Details Patient Name: Date of Service: [name] [name], [name] [name] ID I. 12/21/2020 2:15 PM Medical Record Number: [id number] Patient Account Number: [id number] Date of Birth/Sex: Treating RN: 12-11-1926 (87 y.o. [name] [name] Primary Care Provider: Jerlyn [name] Other Clinician: Referring Provider: Treating Provider/Extender: [name] [name] in Treatment: 13 Debridement Performed for Assessment: Wound #4 Left,Medial Lower Leg Performed By: Clinician [name] Pilling, RN Debridement Type: Chemical/Enzymatic/Mechanical Agent Used: Santyl Level of Consciousness (Pre-procedure): Awake and Alert Pre-procedure Verification/Time Out No Taken: Bleeding: None Response to Treatment: Procedure was tolerated well Level of Consciousness (Post- Awake and Alert procedure): Post Debridement Measurements of Total Wound Length: (cm) 1.1 Width: (cm) 1.1 Depth: (cm) 0.5 Volume: (cm)  0.475 Character of Wound/Ulcer Post Debridement: Requires Further Debridement Post Procedure Diagnosis Same as Pre-procedure Electronic Signature(s) Signed: 12/21/2020 5:18:31 PM By: [name] PA-C Signed: 12/21/2020 6:10:28 PM By: [name] Pilling RN, BSN Entered By: [name] [name] on 12/21/2020 15:30:20 -------------------------------------------------------------------------------- HPI Details Patient Name: Date of Service: [name] [name], [name] [name] ID I. 12/21/2020 2:15 PM Medical Record Number: [id number] Patient Account Number: [id number] Date of Birth/Sex: Treating RN: 1926-04-19 (87 y.o. [name] [name] Primary Care Provider: Jerlyn [name] Other Clinician: Referring Provider: Treating Provider/Extender: [name] [name] in Treatment: 13 History of Present Illness HPI Description: 09/21/2020 upon evaluation today patient appears to be doing somewhat poorly in regard to a wound in the right lateral heel location. This fortunately does not appear to be too bad but nonetheless is definitely a spot that we need to work on try to get healed for him. Has been here for quite some time. Fortunately there does not appear to be any evidence of active infection at this time which is great news. With that being said I do believe that he has some evidence here of peripheral vascular disease this is minimal his ABI 0.87. Other than that he does have a history of BPH, chronic kidney disease stage III, and hypertension. Currently he has been using different medication such as Silvadene or antibiotic ointment to try to help treat things. This just is not helping out as efficiently as it should be. Of note patient's history includes that of a triple bypass in 2010, a kyphoplasty at L1 which was done 8 months ago, unfortunately he had a fracture 2 months ago which she sustained a T7/T8 fracture to his spine which ended up with him being in the hospital. It is in that time since then that  he developed this wound roughly 2 to 3 months ago is not exactly sure when. He does typically wear compression socks in the 20 to 30 mmHg range. 09/28/2020 upon evaluation today patient presents for follow-up and overall seems to be doing more poorly based on what I am seeing currently. His ABI last time was measured at 0.87 but I am beginning to think this may be falsely elevated due to the fact that there is more eschar and subsequently a concern here of vascular compromise that may be part of her issue. I think that we need to avoid any additional sharp debridement and make a referral to vascular surgery as soon as possible here. Subsequently also went ahead and did discuss the possibility of starting the patient on Santyl instead of the collagen in order to help clean  up the surface of the wound this will take the place of sharp debridement currently. 10/19/2020 upon evaluation today patient's wound actually appears to be doing okay although he has an area on the medial aspect of his heel that is only been showing up for the past couple of days this is more of a blister that has ruptured. The good news is there is no deep wound here which is good. Fortunately there is no signs of active infection at this time also good news. With that being said however the blood flow was not good with an ABI of 0.67 on the right it was around 1 on the left nonetheless I think he does need to see vascular we put that referral in last week on the 12th Dr. Dellia Nims reviewed that for me and made that recommendations. Nonetheless the patient has not heard anything from vascular yet as far as getting this scheduled. I think this needs to be done sooner rather than later as I am concerned about the possibility of a limb threatening situation if we do not get this under control.. 11/16/2020 upon evaluation today patient presents for follow-up regarding issues that he has been having with his bilateral lower extremities in  particular. He in fact still has an issue with his right heel. This appears to be a stage III pressure ulcer at this point there is some slough noted. Subsequently he does have revascularization that is taking place on the right leg as a follow-up later this month in that regard. No repeat ABIs. Subsequently the patient also has venous stasis bilaterally which is also problematic for him at this time. He has some ulcers on the left ankle and left leg that are getting need to be addressed as well. 12/21/2020 upon evaluation today patient appears to be doing poorly in regard to his bilateral lower extremity ulcers. He still has areas on both lower extremities currently ineffective even some areas that were not there previous. Fortunately there is no signs of active infection at this time. No fevers, chills, nausea, vomiting, or diarrhea. Electronic Signature(s) Signed: 12/21/2020 3:36:26 PM By: [name] PA-C Entered By: [name] on 12/21/2020 15:36:26 -------------------------------------------------------------------------------- Physical Exam Details Patient Name: Date of Service: [name] [name], [name] [name] ID I. 12/21/2020 2:15 PM Medical Record Number: [id number] Patient Account Number: [id number] Date of Birth/Sex: Treating RN: 22-Jun-1926 (87 y.o. [name] [name] Primary Care Provider: Jerlyn [name] Other Clinician: Referring Provider: Treating Provider/Extender: [name] in Treatment: 46 Constitutional Well-nourished and well-hydrated in no acute distress. Respiratory normal breathing without difficulty. Psychiatric this patient is able to make decisions and demonstrates good insight into disease process. Alert and Oriented x 3. pleasant and cooperative. Notes Upon inspection patient's wound actually showed signs of fairly good granulation and epithelization at this point. Fortunately there does not appear to be any signs of active infection locally nor  systemically at this point. In general I am actually very pleased with where we stand and I think that the patient is making fairly good progress here currently. Nonetheless they have been using gauze and stretch them to secure in place and not use any sticky bandages as this was causing more trouble in the past to be honest. Electronic Signature(s) Signed: 12/21/2020 3:37:12 PM By: [name] PA-C Entered By: [name] on 12/21/2020 15:37:12 -------------------------------------------------------------------------------- Physician Orders Details Patient Name: Date of Service: [name] [name], [name] [name] ID I. 12/21/2020 2:15 PM Medical Record  Number: [id number] Patient Account Number: [id number] Date of Birth/Sex: Treating RN: July 04, 1926 (87 y.o. [name], [name] Primary Care Provider: Jerlyn [name] Other Clinician: Referring Provider: Treating Provider/Extender: [name] [name] in Treatment: 18 Verbal / Phone Orders: No Diagnosis Coding ICD-10 Coding Code Description L89.613 Pressure ulcer of right heel, stage 3 I73.89 Other specified peripheral vascular diseases I87.2 Venous insufficiency (chronic) (peripheral) L97.822 Non-pressure chronic ulcer of other part of left lower leg with fat layer exposed L97.312 Non-pressure chronic ulcer of right ankle with fat layer exposed N40.1 Benign prostatic hyperplasia with lower urinary tract symptoms N18.30 Chronic kidney disease, stage 3 unspecified I10 Essential (primary) hypertension Follow-up Appointments ppointment in 2 weeks. Margarita Grizzle Wednesday ***Extra time 60 minutes*** Return A Other: - [employer]- supplies. Bathing/ Shower/ Hygiene May shower and wash wound with soap and water. - with dressing changes. Edema Control - Lymphedema / SCD / Other Elevate legs to the level of the heart or above for 30 minutes daily and/or when sitting, a frequency of: - throughout the day. Avoid standing for long periods of  time. Moisturize legs daily. - both legs and feet with dressing changes. Off-Loading Other: - bunny boots to both feet while resting in bed or chair. Ensure no pressure to wounds to aid in healing. Additional Orders / Instructions Other: - May continue to walk and work with physical therapy. Wound Treatment Wound #1 - Calcaneus Wound Laterality: Right, Lateral Cleanser: Soap and Water Every Other Day/30 Days Discharge Instructions: May shower and wash wound with dial antibacterial soap and water prior to dressing change. Prim Dressing: Hydrofera Blue Classic Foam, 2x2 in (Generic) Every Other Day/30 Days ary Discharge Instructions: Moisten with saline prior to applying over the santyl. Prim Dressing: Santyl Ointment Every Other Day/30 Days ary Discharge Instructions: Apply nickel thick amount directly to wound bed. Secondary Dressing: Woven Gauze Sponge, Non-Sterile 4x4 in (DME) (Generic) Every Other Day/30 Days Discharge Instructions: Apply over primary dressing as directed. Secured With: [employer], 4.5x3.1 (in/yd) (DME) (Generic) Every Other Day/30 Days Discharge Instructions: Secure with Kerlix as directed. Secured With: 44M Medipore H Soft Cloth Surgical T ape, 4 x 10 (in/yd) (DME) (Generic) Every Other Day/30 Days Discharge Instructions: Secure with tape as directed. Wound #2 - Calcaneus Wound Laterality: Right, Medial Cleanser: Soap and Water Every Other Day/30 Days Discharge Instructions: May shower and wash wound with dial antibacterial soap and water prior to dressing change. Prim Dressing: Hydrofera Blue Classic Foam, 2x2 in (Generic) Every Other Day/30 Days ary Discharge Instructions: Moisten with saline prior to applying over the santyl. Prim Dressing: Santyl Ointment Every Other Day/30 Days ary Discharge Instructions: Apply nickel thick amount directly to wound bed. Secondary Dressing: Woven Gauze Sponge, Non-Sterile 4x4 in (DME) (Generic) Every Other Day/30  Days Discharge Instructions: Apply over primary dressing as directed. Secured With: [employer], 4.5x3.1 (in/yd) (DME) (Generic) Every Other Day/30 Days Discharge Instructions: Secure with Kerlix as directed. Secured With: 44M Medipore H Soft Cloth Surgical T ape, 4 x 10 (in/yd) (DME) (Generic) Every Other Day/30 Days Discharge Instructions: Secure with tape as directed. Wound #3 - Ankle Wound Laterality: Left, Lateral Cleanser: Soap and Water Every Other Day/30 Days Discharge Instructions: May shower and wash wound with dial antibacterial soap and water prior to dressing change. Prim Dressing: Hydrofera Blue Classic Foam, 2x2 in (Generic) Every Other Day/30 Days ary Discharge Instructions: Moisten with saline prior to applying over the santyl. Prim Dressing: Santyl Ointment Every Other  Day/30 Days ary Discharge Instructions: Apply nickel thick amount directly to wound bed. Secondary Dressing: Woven Gauze Sponge, Non-Sterile 4x4 in (DME) (Generic) Every Other Day/30 Days Discharge Instructions: Apply over primary dressing as directed. Secured With: [employer], 4.5x3.1 (in/yd) (DME) (Generic) Every Other Day/30 Days Discharge Instructions: Secure with Kerlix as directed. Secured With: 46M Medipore H Soft Cloth Surgical T ape, 4 x 10 (in/yd) (DME) (Generic) Every Other Day/30 Days Discharge Instructions: Secure with tape as directed. Wound #4 - Lower Leg Wound Laterality: Left, Medial Cleanser: Soap and Water Every Other Day/30 Days Discharge Instructions: May shower and wash wound with dial antibacterial soap and water prior to dressing change. Prim Dressing: Hydrofera Blue Classic Foam, 2x2 in (Generic) Every Other Day/30 Days ary Discharge Instructions: Moisten with saline prior to applying over the santyl. Prim Dressing: Santyl Ointment Every Other Day/30 Days ary Discharge Instructions: Apply nickel thick amount directly to wound bed. Secondary Dressing: Woven Gauze  Sponge, Non-Sterile 4x4 in (DME) (Generic) Every Other Day/30 Days Discharge Instructions: Apply over primary dressing as directed. Secured With: [employer], 4.5x3.1 (in/yd) (DME) (Generic) Every Other Day/30 Days Discharge Instructions: Secure with Kerlix as directed. Secured With: 46M Medipore H Soft Cloth Surgical T ape, 4 x 10 (in/yd) (DME) (Generic) Every Other Day/30 Days Discharge Instructions: Secure with tape as directed. Wound #6 - Ankle Wound Laterality: Right, Lateral Secondary Dressing: Woven Gauze Sponge, Non-Sterile 4x4 in Every Other Day/30 Days Discharge Instructions: Apply over primary dressing as directed. Secured With: [employer], 4.5x3.1 (in/yd) Every Other Day/30 Days Discharge Instructions: Secure with Kerlix as directed. Secured With: 46M Medipore H Soft Cloth Surgical T ape, 4 x 10 (in/yd) Every Other Day/30 Days Discharge Instructions: Secure with tape as directed. Electronic Signature(s) Signed: 12/21/2020 5:18:31 PM By: [name] PA-C Signed: 12/21/2020 6:10:28 PM By: [name] Pilling RN, BSN Entered By: [name] [name] on 12/21/2020 15:32:09 -------------------------------------------------------------------------------- Problem List Details Patient Name: Date of Service: [name] [name], [name] [name] ID I. 12/21/2020 2:15 PM Medical Record Number: [id number] Patient Account Number: [id number] Date of Birth/Sex: Treating RN: 05-15-1926 (87 y.o. [name] [name] Primary Care Provider: Jerlyn [name] Other Clinician: Referring Provider: Treating Provider/Extender: [name] [name] in Treatment: 13 Active Problems ICD-10 Encounter Code Description Active Date MDM Diagnosis L89.613 Pressure ulcer of right heel, stage 3 09/21/2020 No Yes I73.89 Other specified peripheral vascular diseases 09/21/2020 No Yes I87.2 Venous insufficiency (chronic) (peripheral) 11/16/2020 No Yes L97.822 Non-pressure chronic ulcer of other part of left lower leg  with fat layer 11/16/2020 No Yes exposed L97.312 Non-pressure chronic ulcer of right ankle with fat layer exposed 11/16/2020 No Yes N40.1 Benign prostatic hyperplasia with lower urinary tract symptoms 09/21/2020 No Yes N18.30 Chronic kidney disease, stage 3 unspecified 09/21/2020 No Yes I10 Essential (primary) hypertension 09/21/2020 No Yes Inactive Problems Resolved Problems Electronic Signature(s) Signed: 12/21/2020 2:43:26 PM By: [name] PA-C Entered By: [name] on 12/21/2020 14:43:25 -------------------------------------------------------------------------------- Progress Note Details Patient Name: Date of Service: [name] [name], [name] [name] ID I. 12/21/2020 2:15 PM Medical Record Number: [id number] Patient Account Number: [id number] Date of Birth/Sex: Treating RN: November 12, 1926 (87 y.o. [name] [name] Primary Care Provider: Jerlyn [name] Other Clinician: Referring Provider: Treating Provider/Extender: [name] [name] in Treatment: 13 Subjective Chief Complaint Information obtained from Patient Pressure ulcer right heel and left LE and ankle ulcers History of Present Illness (HPI) 09/21/2020 upon evaluation today patient appears to be doing  somewhat poorly in regard to a wound in the right lateral heel location. This fortunately does not appear to be too bad but nonetheless is definitely a spot that we need to work on try to get healed for him. Has been here for quite some time. Fortunately there does not appear to be any evidence of active infection at this time which is great news. With that being said I do believe that he has some evidence here of peripheral vascular disease this is minimal his ABI 0.87. Other than that he does have a history of BPH, chronic kidney disease stage III, and hypertension. Currently he has been using different medication such as Silvadene or antibiotic ointment to try to help treat things. This just is not helping out as  efficiently as it should be. Of note patient's history includes that of a triple bypass in 2010, a kyphoplasty at L1 which was done 8 months ago, unfortunately he had a fracture 2 months ago which she sustained a T7/T8 fracture to his spine which ended up with him being in the hospital. It is in that time since then that he developed this wound roughly 2 to 3 months ago is not exactly sure when. He does typically wear compression socks in the 20 to 30 mmHg range. 09/28/2020 upon evaluation today patient presents for follow-up and overall seems to be doing more poorly based on what I am seeing currently. His ABI last time was measured at 0.87 but I am beginning to think this may be falsely elevated due to the fact that there is more eschar and subsequently a concern here of vascular compromise that may be part of her issue. I think that we need to avoid any additional sharp debridement and make a referral to vascular surgery as soon as possible here. Subsequently also went ahead and did discuss the possibility of starting the patient on Santyl instead of the collagen in order to help clean up the surface of the wound this will take the place of sharp debridement currently. 10/19/2020 upon evaluation today patient's wound actually appears to be doing okay although he has an area on the medial aspect of his heel that is only been showing up for the past couple of days this is more of a blister that has ruptured. The good news is there is no deep wound here which is good. Fortunately there is no signs of active infection at this time also good news. With that being said however the blood flow was not good with an ABI of 0.67 on the right it was around 1 on the left nonetheless I think he does need to see vascular we put that referral in last week on the 12th Dr. Dellia Nims reviewed that for me and made that recommendations. Nonetheless the patient has not heard anything from vascular yet as far as getting this  scheduled. I think this needs to be done sooner rather than later as I am concerned about the possibility of a limb threatening situation if we do not get this under control.. 11/16/2020 upon evaluation today patient presents for follow-up regarding issues that he has been having with his bilateral lower extremities in particular. He in fact still has an issue with his right heel. This appears to be a stage III pressure ulcer at this point there is some slough noted. Subsequently he does have revascularization that is taking place on the right leg as a follow-up later this month in that regard. No repeat ABIs.  Subsequently the patient also has venous stasis bilaterally which is also problematic for him at this time. He has some ulcers on the left ankle and left leg that are getting need to be addressed as well. 12/21/2020 upon evaluation today patient appears to be doing poorly in regard to his bilateral lower extremity ulcers. He still has areas on both lower extremities currently ineffective even some areas that were not there previous. Fortunately there is no signs of active infection at this time. No fevers, chills, nausea, vomiting, or diarrhea. Objective Constitutional Well-nourished and well-hydrated in no acute distress. Vitals Time Taken: 2:25 PM, Height: 72 in, Weight: 223 lbs, BMI: 30.2, Temperature: 97.7 F, Pulse: 65 bpm, Respiratory Rate: 20 breaths/min, Blood Pressure: 130/58 mmHg. Respiratory normal breathing without difficulty. Psychiatric this patient is able to make decisions and demonstrates good insight into disease process. Alert and Oriented x 3. pleasant and cooperative. General Notes: Upon inspection patient's wound actually showed signs of fairly good granulation and epithelization at this point. Fortunately there does not appear to be any signs of active infection locally nor systemically at this point. In general I am actually very pleased with where we stand and I  think that the patient is making fairly good progress here currently. Nonetheless they have been using gauze and stretch them to secure in place and not use any sticky bandages as this was causing more trouble in the past to be honest. Integumentary (Hair, Skin) Wound #1 status is Open. Original cause of wound was Pressure Injury. The date acquired was: 06/08/2020. The wound has been in treatment 13 weeks. The wound is located on the Right,Lateral Calcaneus. The wound measures 0.8cm length x 0.7cm width x 0.3cm depth; 0.44cm^2 area and 0.132cm^3 volume. There is Fat Layer (Subcutaneous Tissue) exposed. There is no tunneling or undermining noted. There is a medium amount of serosanguineous drainage noted. The wound margin is distinct with the outline attached to the wound base. There is large (67-100%) red granulation within the wound bed. There is a small (1-33%) amount of necrotic tissue within the wound bed including Adherent Slough. Wound #2 status is Open. Original cause of wound was Pressure Injury. The date acquired was: 10/17/2020. The wound has been in treatment 9 weeks. The wound is located on the Right,Medial Calcaneus. The wound measures 2.2cm length x 4cm width x 0.1cm depth; 6.912cm^2 area and 0.691cm^3 volume. There is Fat Layer (Subcutaneous Tissue) exposed. There is no tunneling or undermining noted. There is a medium amount of serous drainage noted. The wound margin is distinct with the outline attached to the wound base. There is large (67-100%) red, pink granulation within the wound bed. There is a small (1-33%) amount of necrotic tissue within the wound bed including Eschar and Adherent Slough. Wound #3 status is Open. Original cause of wound was Gradually Appeared. The date acquired was: 10/12/2020. The wound has been in treatment 5 weeks. The wound is located on the Left,Lateral Ankle. The wound measures 1.8cm length x 1.3cm width x 0.3cm depth; 1.838cm^2 area and 0.551cm^3 volume.  There is Fat Layer (Subcutaneous Tissue) exposed. There is no tunneling or undermining noted. There is a medium amount of serosanguineous drainage noted. The wound margin is distinct with the outline attached to the wound base. There is small (1-33%) red granulation within the wound bed. There is a large (67-100%) amount of necrotic tissue within the wound bed including Adherent Slough. Wound #4 status is Open. Original cause of wound was Other Lesion. The  date acquired was: 11/16/2020. The wound has been in treatment 5 weeks. The wound is located on the Left,Medial Lower Leg. The wound measures 1.1cm length x 1.1cm width x 0.5cm depth; 0.95cm^2 area and 0.475cm^3 volume. There is Fat Layer (Subcutaneous Tissue) exposed. There is no tunneling or undermining noted. There is a medium amount of serosanguineous drainage noted. The wound margin is well defined and not attached to the wound base. There is no granulation within the wound bed. There is a large (67-100%) amount of necrotic tissue within the wound bed including Eschar and Adherent Slough. Wound #5 status is Open. Original cause of wound was Gradually Appeared. The date acquired was: 11/16/2020. The wound has been in treatment 5 weeks. The wound is located on the Left,Anterior Lower Leg. The wound measures 0cm length x 0cm width x 0cm depth; 0cm^2 area and 0cm^3 volume. There is Fat Layer (Subcutaneous Tissue) exposed. There is a medium amount of serosanguineous drainage noted. The wound margin is distinct with the outline attached to the wound base. There is large (67-100%) red granulation within the wound bed. There is no necrotic tissue within the wound bed. Wound #6 status is Open. Original cause of wound was Gradually Appeared. The date acquired was: 11/22/2020. The wound is located on the Right,Lateral Ankle. The wound measures 0.7cm length x 0.5cm width x 0.5cm depth; 0.275cm^2 area and 0.137cm^3 volume. There is no tunneling or  undermining noted. There is a medium amount of serosanguineous drainage noted. The wound margin is distinct with the outline attached to the wound base. There is a large (67- 100%) amount of necrotic tissue within the wound bed including Eschar and Adherent Slough. 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 There was a Chemical/Enzymatic/Mechanical debridement performed by [name] Pilling, RN.Marland Kitchen Agent used was [employer]. There was no bleeding. The procedure was tolerated well. Post Debridement Measurements: 0.8cm length x 0.7cm width x 0.3cm depth; 0.132cm^3 volume. Post debridement Stage noted as Category/Stage III. Character of Wound/Ulcer Post Debridement requires further debridement. Post procedure Diagnosis Wound #1: Same as Pre-Procedure Wound #2 Pre-procedure diagnosis of Wound #2 is a Pressure Ulcer located on the Right,Medial Calcaneus . There was a Chemical/Enzymatic/Mechanical debridement performed by [name] Pilling, RN.Marland Kitchen Agent used was [employer]. There was no bleeding. The procedure was tolerated well. Post Debridement Measurements: 2.2cm length x 4cm width x 0.1cm depth; 0.691cm^3 volume. Post debridement Stage noted as Category/Stage III. Character of Wound/Ulcer Post Debridement requires further debridement. Post procedure Diagnosis Wound #2: Same as Pre-Procedure Wound #3 Pre-procedure diagnosis of Wound #3 is a Venous Leg Ulcer located on the Left,Lateral Ankle  .Severity of Tissue Pre Debridement is: Fat layer exposed. There was a Chemical/Enzymatic/Mechanical debridement performed by [name] Pilling, RN.Marland Kitchen Agent used was [employer]. There was no bleeding. The procedure was tolerated well. Post Debridement Measurements: 1.8cm length x 1.3cm width x 0.3cm depth; 0.551cm^3 volume. Character of Wound/Ulcer Post Debridement requires further debridement. Severity of Tissue Post Debridement is: Fat layer exposed. Post procedure Diagnosis Wound #3: Same as Pre-Procedure Wound #4 Pre-procedure diagnosis of Wound #4 is an Abscess located  on the Left,Medial Lower Leg . There was a Chemical/Enzymatic/Mechanical debridement performed by [name] Pilling, RN.Marland Kitchen Agent used was [employer]. There was no bleeding. The procedure was tolerated well. Post Debridement Measurements: 1.1cm length x 1.1cm width x 0.5cm depth; 0.475cm^3 volume. Character of Wound/Ulcer Post Debridement requires further debridement. Post procedure Diagnosis Wound #4: Same as Pre-Procedure Plan Follow-up Appointments: Return Appointment in 2 weeks. Margarita Grizzle Wednesday ***Extra time 60 minutes*** Other: - [employer]- supplies. Bathing/ Shower/ Hygiene: May shower and wash wound with soap and water. - with dressing changes. Edema Control - Lymphedema / SCD / Other: Elevate legs to the level of the heart or above for 30 minutes daily and/or when sitting, a frequency of: - throughout the day. Avoid standing for long periods of time. Moisturize legs daily. - both legs and feet with dressing changes. Off-Loading: Other: - bunny boots to both feet while resting in bed or chair. Ensure no pressure to wounds to aid in healing. Additional Orders / Instructions: Other: - May continue to walk and work with physical therapy. WOUND #1: - Calcaneus Wound Laterality: Right, Lateral Cleanser: Soap and Water Every Other Day/30 Days Discharge Instructions: May shower and wash wound with dial antibacterial soap and  water prior to dressing change. Prim Dressing: Hydrofera Blue Classic Foam, 2x2 in (Generic) Every Other Day/30 Days ary Discharge Instructions: Moisten with saline prior to applying over the santyl. Prim Dressing: Santyl Ointment Every Other Day/30 Days ary Discharge Instructions: Apply nickel thick amount directly to wound bed. Secondary Dressing: Woven Gauze Sponge, Non-Sterile 4x4 in (DME) (Generic) Every Other Day/30 Days Discharge Instructions: Apply over primary dressing as directed. Secured With: [employer], 4.5x3.1 (in/yd) (DME) (Generic) Every Other Day/30 Days Discharge Instructions: Secure with Kerlix as directed. Secured With: 81M Medipore H Soft Cloth Surgical T ape, 4 x 10 (in/yd) (DME) (Generic) Every Other Day/30 Days Discharge Instructions: Secure with tape as directed. WOUND #2: - Calcaneus Wound Laterality: Right, Medial Cleanser: Soap and Water Every Other Day/30 Days Discharge Instructions: May shower and wash wound with dial antibacterial soap and water prior to dressing change. Prim Dressing: Hydrofera Blue Classic Foam, 2x2 in (Generic) Every Other Day/30 Days ary Discharge Instructions: Moisten with saline prior to applying over the santyl. Prim Dressing: Santyl Ointment Every Other Day/30 Days ary Discharge Instructions: Apply nickel thick amount directly to wound bed. Secondary Dressing: Woven Gauze Sponge, Non-Sterile 4x4 in (DME) (Generic) Every Other Day/30 Days Discharge Instructions: Apply over primary dressing as directed. Secured With: [employer], 4.5x3.1 (in/yd) (DME) (Generic) Every Other Day/30 Days Discharge Instructions: Secure with Kerlix as directed. Secured With: 81M Medipore H Soft Cloth Surgical T ape, 4 x 10 (in/yd) (DME) (Generic) Every Other Day/30 Days Discharge Instructions: Secure with tape as directed. WOUND #3: - Ankle Wound Laterality: Left, Lateral Cleanser: Soap and Water Every Other Day/30 Days Discharge  Instructions: May shower and wash wound with dial antibacterial soap and water prior to dressing change. Prim Dressing: Hydrofera Blue Classic Foam, 2x2 in (Generic) Every Other Day/30 Days ary Discharge Instructions: Moisten with saline prior to applying over the santyl. Prim Dressing: Santyl Ointment Every Other Day/30 Days ary Discharge Instructions: Apply nickel thick amount directly to wound bed. Secondary Dressing: Woven Gauze Sponge, Non-Sterile 4x4 in (DME) (Generic) Every Other Day/30 Days Discharge Instructions: Apply over primary dressing as directed. Secured With: [employer], 4.5x3.1 (in/yd) (DME) (Generic) Every Other Day/30 Days Discharge Instructions: Secure with Kerlix as directed. Secured With: 81M Medipore  H Soft Cloth Surgical T ape, 4 x 10 (in/yd) (DME) (Generic) Every Other Day/30 Days Discharge Instructions: Secure with tape as directed. WOUND #4: - Lower Leg Wound Laterality: Left, Medial Cleanser: Soap and Water Every Other Day/30 Days Discharge Instructions: May shower and wash wound with dial antibacterial soap and water prior to dressing change. Prim Dressing: Hydrofera Blue Classic Foam, 2x2 in (Generic) Every Other Day/30 Days ary Discharge Instructions: Moisten with saline prior to applying over the santyl. Prim Dressing: Santyl Ointment Every Other Day/30 Days ary Discharge Instructions: Apply nickel thick amount directly to wound bed. Secondary Dressing: Woven Gauze Sponge, Non-Sterile 4x4 in (DME) (Generic) Every Other Day/30 Days Discharge Instructions: Apply over primary dressing as directed. Secured With: [employer], 4.5x3.1 (in/yd) (DME) (Generic) Every Other Day/30 Days Discharge Instructions: Secure with Kerlix as directed. Secured With: 51M Medipore H Soft Cloth Surgical T ape, 4 x 10 (in/yd) (DME) (Generic) Every Other Day/30 Days Discharge Instructions: Secure with tape as directed. WOUND #6: - Ankle Wound Laterality: Right,  Lateral Secondary Dressing: Woven Gauze Sponge, Non-Sterile 4x4 in Every Other Day/30 Days Discharge Instructions: Apply over primary dressing as directed. Secured With: [employer], 4.5x3.1 (in/yd) Every Other Day/30 Days Discharge Instructions: Secure with Kerlix as directed. Secured With: 51M Medipore H Soft Cloth Surgical T ape, 4 x 10 (in/yd) Every Other Day/30 Days Discharge Instructions: Secure with tape as directed. 1. I would recommend currently that we going to continue with the wound care measures as before and the patient is in agreement with plan this includes the use of the Santyl followed by the [facility] which I think has been beneficial at this point. 2. I am also can recommend at this time that we have the patient continue to monitor for any signs of infection or worsening. Obviously right now I think the biggest thing is good to be to keep pressure off of the areas I see several regions where there does appear to be some pressure injury noted currently. 3. I am also can recommend the patient continue with dressing changes 3 times per week or every other day his wife is doing those and seems to be doing a great job in my opinion. We will see patient back for reevaluation in 2 weeks here in the clinic. If anything worsens or changes patient will contact our office for additional recommendations. Electronic Signature(s) Signed: 12/21/2020 3:38:18 PM By: [name] PA-C Entered By: [name] on 12/21/2020 15:38:18 -------------------------------------------------------------------------------- SuperBill Details Patient Name: Date of Service: [name] [name], [name] [name] ID I. 12/21/2020 Medical Record Number: [id number] Patient Account Number: [id number] Date of Birth/Sex: Treating RN: 26-Aug-1926 (87 y.o. [name], [name] Primary Care Provider: Jerlyn [name] Other Clinician: Referring Provider: Treating Provider/Extender: [name] [name] in  Treatment: 13 Diagnosis Coding ICD-10 Codes Code Description [phone] Pressure ulcer of right heel, stage 3 I73.89 Other specified peripheral vascular diseases I87.2 Venous insufficiency (chronic) (peripheral) L97.822 Non-pressure chronic ulcer of other part of left lower leg with fat layer exposed L97.312 Non-pressure chronic ulcer of right ankle with fat layer exposed N40.1 Benign prostatic hyperplasia with lower urinary tract symptoms N18.30 Chronic kidney disease, stage 3 unspecified I10 Essential (primary) hypertension Facility Procedures CPT4 Code: [id number] Description: 63149 - DEBRIDE W/O ANES NON SELECT Modifier: Quantity: 1 Physician Procedures : CPT4 Code Description Modifier [id number] - WC PHYS LEVEL 4 - EST PT ICD-10 Diagnosis Description L89.613 Pressure ulcer of right heel, stage 3  I73.89 Other specified peripheral vascular diseases I87.2 Venous insufficiency (chronic) (peripheral)  L97.822 Non-pressure chronic ulcer of other part of left lower leg with fat layer exposed Quantity: 1 Electronic Signature(s) Signed: 12/21/2020 3:42:17 PM By: [name] PA-C Entered By: [name] on 12/21/2020 15:42:16

## 2020-12-22 DIAGNOSIS — L89610 Pressure ulcer of right heel, unstageable: Secondary | ICD-10-CM | POA: Diagnosis not present

## 2020-12-22 NOTE — Progress Notes (Signed)
[name] [name], [name] [name] [name]. ([id number]) Visit Report for 12/21/2020 Arrival Information Details Patient Name: Date of Service: [name] [name] ID [name]. 12/21/2020 2:15 PM Medical Record Number: [id number] Patient Account Number: [id number] Date of Birth/Sex: Treating RN: 02-17-1926 (87 y.o. [name] Primary Care Gared Gillie: [name] Other Clinician: Referring Nohelani Benning: Treating Donielle Kaigler/Extender: Eli Hose in Treatment: 23 Visit Information History Since Last Visit Added or deleted any medications: No Patient Arrived: Wheel Chair Any new allergies or adverse reactions: No Arrival Time: 14:25 Had a fall or experienced change in No Accompanied By: wife activities of daily living that may affect Transfer Assistance: Manual risk of falls: Patient Identification Verified: Yes Signs or symptoms of abuse/neglect since last visito No Secondary Verification Process Completed: Yes Hospitalized since last visit: Yes Patient Requires Transmission-Based Precautions: No Implantable device outside of the clinic excluding No Patient Has Alerts: Yes cellular tissue based products placed in the center Patient Alerts: Patient on Blood Thinner since last visit: Has Dressing in Place as Prescribed: Yes Pain Present Now: No Notes COVID since last visit here. Electronic Signature(s) Signed: 12/21/2020 6:10:28 PM By: Deon Pilling RN, BSN Entered By: Deon Pilling on 12/21/2020 14:37:31 -------------------------------------------------------------------------------- Encounter Discharge Information Details Patient Name: Date of Service: [name] [name], [name] [name]. 12/21/2020 2:15 PM Medical Record Number: [id number] Patient Account Number: [id number] Date of Birth/Sex: Treating RN: 1926/01/31 (87 y.o. [name] Primary Care Lelend Heinecke: [name] Other Clinician: Referring Ketzaly Cardella: Treating Pio Eatherly/Extender: Eli Hose in Treatment: 13 Encounter  Discharge Information Items Post Procedure Vitals Discharge Condition: Stable Temperature (F): 97.7 Ambulatory Status: Wheelchair Pulse (bpm): 65 Discharge Destination: Home Respiratory Rate (breaths/min): 20 Transportation: Private Auto Blood Pressure (mmHg): 130/58 Accompanied By: wife Schedule Follow-up Appointment: Yes Clinical Summary of Care: Electronic Signature(s) Signed: 12/21/2020 6:10:28 PM By: Deon Pilling RN, BSN Entered By: Deon Pilling on 12/21/2020 16:59:37 -------------------------------------------------------------------------------- Lower Extremity Assessment Details Patient Name: Date of Service: [name] [name], [name] [name]. 12/21/2020 2:15 PM Medical Record Number: [id number] Patient Account Number: [id number] Date of Birth/Sex: Treating RN: 1926/08/29 (87 y.o. [name] Primary Care Raeghan Demeter: [name] Other Clinician: Referring Reneka Nebergall: Treating Mance Vallejo/Extender: [name] in Treatment: 13 Edema Assessment Assessed: [Left: Yes] Patrice Paradise: Yes] Edema: [Left: Yes] [Right: Yes] Calf Left: Right: Point of Measurement: 34 cm From Medial Instep 38 cm 38 cm Ankle Left: Right: Point of Measurement: 11 cm From Medial Instep 25 cm 25 cm Notes feet cold to touch. Electronic Signature(s) Signed: 12/21/2020 6:10:28 PM By: Deon Pilling RN, BSN Entered By: Deon Pilling on 12/21/2020 14:38:27 -------------------------------------------------------------------------------- Multi-Disciplinary Care Plan Details Patient Name: Date of Service: [name] [name], [name] [name]. 12/21/2020 2:15 PM Medical Record Number: [id number] Patient Account Number: [id number] Date of Birth/Sex: Treating RN: 10/05/1926 (87 y.o. [name] Primary Care Omair Dettmer: [name] Other Clinician: Referring Mylan Lengyel: Treating Benjermin Korber/Extender: Eli Hose in Treatment: 13 Active Inactive Tissue Oxygenation Nursing Diagnoses: Potential  alteration in peripheral tissue perfusion (select prior to confirmation of diagnosis) Goals: Non-invasive arterial studies are completed as ordered Date Initiated: 09/21/2020 Date Inactivated: 10/19/2020 Target Resolution Date: 09/21/2020 Goal Status: Met Revascularization procedures completed as ordered Date Initiated: 09/21/2020 Target Resolution Date: 01/27/2021 Goal Status: Active Interventions: Assess patient understanding of disease process and management upon diagnosis and as needed Provide education on tissue oxygenation and ischemia Treatment Activities: Ankle Brachial Index (ABI) : 09/21/2020  Non-invasive vascular studies : 09/21/2020 T ordered outside of clinic : 09/21/2020 est Notes: 10/19/20: Had vascular testing, awaiting appointment to see vascular MD Wound/Skin Impairment Nursing Diagnoses: Knowledge deficit related to ulceration/compromised skin integrity Goals: Patient/caregiver will verbalize understanding of skin care regimen Date Initiated: 09/21/2020 Target Resolution Date: 01/27/2021 Goal Status: Active Interventions: Assess patient/caregiver ability to obtain necessary supplies Assess patient/caregiver ability to perform ulcer/skin care regimen upon admission and as needed Provide education on ulcer and skin care Treatment Activities: Skin care regimen initiated : 09/21/2020 Topical wound management initiated : 09/21/2020 Notes: Electronic Signature(s) Signed: 12/21/2020 6:10:28 PM By: Deon Pilling RN, BSN Entered By: Deon Pilling on 12/21/2020 14:40:08 -------------------------------------------------------------------------------- Pain Assessment Details Patient Name: Date of Service: [name] [name], [name] [name]. 12/21/2020 2:15 PM Medical Record Number: [id number] Patient Account Number: [id number] Date of Birth/Sex: Treating RN: 1926-10-20 (87 y.o. [name] Primary Care Sola Margolis: [name] Other Clinician: Referring Stevenson Windmiller: Treating  Leahann Lempke/Extender: Eli Hose in Treatment: 13 Active Problems Location of Pain Severity and Description of Pain Patient Has Paino No Site Locations Rate the pain. Current Pain Level: 0 Pain Management and Medication Current Pain Management: Medication: No Cold Application: No Rest: No Massage: No Activity: No T.E.N.S.: No Heat Application: No Leg drop or elevation: No Is the Current Pain Management Adequate: Adequate How does your wound impact your activities of daily livingo Sleep: No Bathing: No Appetite: No Relationship With Others: No Bladder Continence: No Emotions: No Bowel Continence: No Work: No Toileting: No Drive: No Dressing: No Hobbies: No Engineer, maintenance) Signed: 12/21/2020 6:10:28 PM By: Deon Pilling RN, BSN Entered By: Deon Pilling on 12/21/2020 14:37:55 -------------------------------------------------------------------------------- Patient/Caregiver Education Details Patient Name: Date of Service: [name] [name] ID [name]. 12/21/2022andnbsp2:15 PM Medical Record Number: [id number] Patient Account Number: [id number] Date of Birth/Gender: Treating RN: May 29, 1926 (87 y.o. [name] Primary Care Physician: [name] Other Clinician: Referring Physician: Treating Physician/Extender: Eli Hose in Treatment: 13 Education Assessment Education Provided To: Patient Education Topics Provided Wound/Skin Impairment: Handouts: Skin Care Do's and Dont's Methods: Explain/Verbal Responses: Reinforcements needed Electronic Signature(s) Signed: 12/21/2020 6:10:28 PM By: Deon Pilling RN, BSN Entered By: Deon Pilling on 12/21/2020 14:40:28 -------------------------------------------------------------------------------- Wound Assessment Details Patient Name: Date of Service: [name] [name], [name] [name]. 12/21/2020 2:15 PM Medical Record Number: [id number] Patient Account Number: [id number] Date of  Birth/Sex: Treating RN: [birth year] (87 y.o. [name] Primary Care Jovonda Selner: [name] Other Clinician: Referring Adream Parzych: Treating Galileo Colello/Extender: [name] in Treatment: 13 Wound Status Wound Number: 1 Primary Pressure Ulcer Etiology: Wound Location: Right, Lateral Calcaneus Wound Open Wounding Event: Pressure Injury Status: Date Acquired: 06/08/2020 Comorbid Cataracts, Sleep Apnea, Arrhythmia, Congestive Heart Failure, Weeks Of Treatment: 13 Weeks Of Treatment: 13 History: Coronary Artery Disease, Hypertension, Myocardial Infarction, Clustered Wound: No End Stage Renal Disease, Gout, Dementia, Neuropathy Photos Wound Measurements Length: (cm) 0.8 Width: (cm) 0.7 Depth: (cm) 0.3 Area: (cm) 0.44 Volume: (cm) 0.132 % Reduction in Area: 11.1% % Reduction in Volume: -169.4% Epithelialization: Medium (34-66%) Tunneling: No Undermining: No Wound Description Classification: Category/Stage III Wound Margin: Distinct, outline attached Exudate Amount: Medium Exudate Type: Serosanguineous Exudate Color: red, brown Foul Odor After Cleansing: No Slough/Fibrino Yes Wound Bed Granulation Amount: Large (67-100%) Exposed Structure Granulation Quality: Red Fascia Exposed: No Necrotic Amount: Small (1-33%) Fat Layer (Subcutaneous Tissue) Exposed: Yes Necrotic Quality: Adherent Slough Tendon Exposed: No Muscle Exposed: No  Joint Exposed: No Bone Exposed: No Treatment Notes Wound #1 (Calcaneus) Wound Laterality: Right, Lateral Cleanser Soap and Water Discharge Instruction: May shower and wash wound with dial antibacterial soap and water prior to dressing change. Peri-Wound Care Topical Primary Dressing Hydrofera Blue Classic Foam, 2x2 in Discharge Instruction: Moisten with saline prior to applying over the santyl. Santyl Ointment Discharge Instruction: Apply nickel thick amount directly to wound bed. Secondary Dressing Woven  Gauze Sponge, Non-Sterile 4x4 in Discharge Instruction: Apply over primary dressing as directed. Secured With [employer], 4.5x3.1 (in/yd) Discharge Instruction: Secure with Kerlix as directed. 34M Medipore H Soft Cloth Surgical T ape, 4 x 10 (in/yd) Discharge Instruction: Secure with tape as directed. Compression Wrap Compression Stockings Add-Ons Electronic Signature(s) Signed: 12/21/2020 6:08:54 PM By: Baruch Gouty RN, BSN Signed: 12/21/2020 6:10:28 PM By: Deon Pilling RN, BSN Entered By: Deon Pilling on 12/21/2020 14:38:53 -------------------------------------------------------------------------------- Wound Assessment Details Patient Name: Date of Service: [name] [name], [name] [name]. 12/21/2020 2:15 PM Medical Record Number: [id number] Patient Account Number: [id number] Date of Birth/Sex: Treating RN: 11-May-1926 (87 y.o. [name] Primary Care Cathe Bilger: [name] Other Clinician: Referring Aqueelah Cotrell: Treating Jadalee Westcott/Extender: [name] in Treatment: 13 Wound Status Wound Number: 2 Primary Pressure Ulcer Etiology: Wound Location: Right, Medial Calcaneus Wound Open Wounding Event: Pressure Injury Status: Date Acquired: 10/17/2020 Comorbid Cataracts, Sleep Apnea, Arrhythmia, Congestive Heart Failure, Weeks Of Treatment: 9 History: Coronary Artery Disease, Hypertension, Myocardial Infarction, Clustered Wound: No End Stage Renal Disease, Gout, Dementia, Neuropathy Photos Wound Measurements Length: (cm) 2.2 Width: (cm) 4 Depth: (cm) 0.1 Area: (cm) 6.912 Volume: (cm) 0.691 % Reduction in Area: -633.8% % Reduction in Volume: -635.1% Epithelialization: Small (1-33%) Tunneling: No Undermining: No Wound Description Classification: Category/Stage III Wound Margin: Distinct, outline attached Exudate Amount: Medium Exudate Type: Serous Exudate Color: amber Foul Odor After Cleansing: No Slough/Fibrino Yes Wound  Bed Granulation Amount: Large (67-100%) Exposed Structure Granulation Quality: Red, Pink Fascia Exposed: No Necrotic Amount: Small (1-33%) Fat Layer (Subcutaneous Tissue) Exposed: Yes Necrotic Quality: Eschar, Adherent Slough Tendon Exposed: No Muscle Exposed: No Joint Exposed: No Bone Exposed: No Treatment Notes Wound #2 (Calcaneus) Wound Laterality: Right, Medial Cleanser Soap and Water Discharge Instruction: May shower and wash wound with dial antibacterial soap and water prior to dressing change. Peri-Wound Care Topical Primary Dressing Hydrofera Blue Classic Foam, 2x2 in Discharge Instruction: Moisten with saline prior to applying over the santyl. Santyl Ointment Discharge Instruction: Apply nickel thick amount directly to wound bed. Secondary Dressing Woven Gauze Sponge, Non-Sterile 4x4 in Discharge Instruction: Apply over primary dressing as directed. Secured With [employer], 4.5x3.1 (in/yd) Discharge Instruction: Secure with Kerlix as directed. 34M Medipore H Soft Cloth Surgical T ape, 4 x 10 (in/yd) Discharge Instruction: Secure with tape as directed. Compression Wrap Compression Stockings Add-Ons Electronic Signature(s) Signed: 12/21/2020 6:08:54 PM By: Baruch Gouty RN, BSN Signed: 12/21/2020 6:10:28 PM By: Deon Pilling RN, BSN Entered By: Deon Pilling on 12/21/2020 14:41:01 -------------------------------------------------------------------------------- Wound Assessment Details Patient Name: Date of Service: [name] [name], [name] [name]. 12/21/2020 2:15 PM Medical Record Number: [id number] Patient Account Number: [id number] Date of Birth/Sex: Treating RN: [birth year] (87 y.o. [name] Primary Care Dao Mearns: [name] Other Clinician: Referring Sharai Overbay: Treating Yona Kosek/Extender: [name] in Treatment: 13 Wound Status Wound Number: 3 Primary Venous Leg Ulcer Etiology: Wound Location: Left, Lateral  Ankle Wound Open Wounding Event: Gradually Appeared Status: Date Acquired: 10/12/2020 Comorbid Cataracts,  Sleep Apnea, Arrhythmia, Congestive Heart Failure, Weeks Of Treatment: 5 History: Coronary Artery Disease, Hypertension, Myocardial Infarction, Clustered Wound: No End Stage Renal Disease, Gout, Dementia, Neuropathy Photos Wound Measurements Length: (cm) 1.8 Width: (cm) 1.3 Depth: (cm) 0.3 Area: (cm) 1.838 Volume: (cm) 0.551 % Reduction in Area: -265.4% % Reduction in Volume: -264.9% Epithelialization: None Tunneling: No Undermining: No Wound Description Classification: Full Thickness Without Exposed Support Structures Wound Margin: Distinct, outline attached Exudate Amount: Medium Exudate Type: Serosanguineous Exudate Color: red, brown Foul Odor After Cleansing: No Slough/Fibrino Yes Wound Bed Granulation Amount: Small (1-33%) Exposed Structure Granulation Quality: Red Fascia Exposed: No Necrotic Amount: Large (67-100%) Fat Layer (Subcutaneous Tissue) Exposed: Yes Necrotic Quality: Adherent Slough Tendon Exposed: No Muscle Exposed: No Joint Exposed: No Bone Exposed: No Treatment Notes Wound #3 (Ankle) Wound Laterality: Left, Lateral Cleanser Soap and Water Discharge Instruction: May shower and wash wound with dial antibacterial soap and water prior to dressing change. Peri-Wound Care Topical Primary Dressing Hydrofera Blue Classic Foam, 2x2 in Discharge Instruction: Moisten with saline prior to applying over the santyl. Santyl Ointment Discharge Instruction: Apply nickel thick amount directly to wound bed. Secondary Dressing Woven Gauze Sponge, Non-Sterile 4x4 in Discharge Instruction: Apply over primary dressing as directed. Secured With [employer], 4.5x3.1 (in/yd) Discharge Instruction: Secure with Kerlix as directed. 66M Medipore H Soft Cloth Surgical T ape, 4 x 10 (in/yd) Discharge Instruction: Secure with tape as directed. Compression  Wrap Compression Stockings Add-Ons Electronic Signature(s) Signed: 12/21/2020 6:08:54 PM By: Baruch Gouty RN, BSN Signed: 12/21/2020 6:10:28 PM By: Deon Pilling RN, BSN Entered By: Deon Pilling on 12/21/2020 14:42:22 -------------------------------------------------------------------------------- Wound Assessment Details Patient Name: Date of Service: [name] [name], [name] [name]. 12/21/2020 2:15 PM Medical Record Number: [id number] Patient Account Number: [id number] Date of Birth/Sex: Treating RN: 14-Nov-1926 (87 y.o. [name] Primary Care Jacobb Alen: [name] Other Clinician: Referring Amri Lien: Treating Annikah Lovins/Extender: [name] in Treatment: 13 Wound Status Wound Number: 4 Primary Abscess Etiology: Wound Location: Left, Medial Lower Leg Wound Open Wounding Event: Other Lesion Status: Date Acquired: 11/16/2020 Comorbid Cataracts, Sleep Apnea, Arrhythmia, Congestive Heart Failure, Weeks Of Treatment: 5 History: Coronary Artery Disease, Hypertension, Myocardial Infarction, Clustered Wound: No End Stage Renal Disease, Gout, Dementia, Neuropathy Photos Wound Measurements Length: (cm) 1.1 Width: (cm) 1.1 Depth: (cm) 0.5 Area: (cm) 0.95 Volume: (cm) 0.475 % Reduction in Area: -384.7% % Reduction in Volume: -168.4% Epithelialization: Small (1-33%) Tunneling: No Undermining: No Wound Description Classification: Full Thickness Without Exposed Support Structures Wound Margin: Well defined, not attached Exudate Amount: Medium Exudate Type: Serosanguineous Exudate Color: red, brown Foul Odor After Cleansing: No Slough/Fibrino Yes Wound Bed Granulation Amount: None Present (0%) Exposed Structure Necrotic Amount: Large (67-100%) Fascia Exposed: No Necrotic Quality: Eschar, Adherent Slough Fat Layer (Subcutaneous Tissue) Exposed: Yes Tendon Exposed: No Muscle Exposed: No Joint Exposed: No Bone Exposed: No Treatment Notes Wound  #4 (Lower Leg) Wound Laterality: Left, Medial Cleanser Soap and Water Discharge Instruction: May shower and wash wound with dial antibacterial soap and water prior to dressing change. Peri-Wound Care Topical Primary Dressing Hydrofera Blue Classic Foam, 2x2 in Discharge Instruction: Moisten with saline prior to applying over the santyl. Santyl Ointment Discharge Instruction: Apply nickel thick amount directly to wound bed. Secondary Dressing Woven Gauze Sponge, Non-Sterile 4x4 in Discharge Instruction: Apply over primary dressing as directed. Secured With [employer], 4.5x3.1 (in/yd) Discharge Instruction: Secure with Kerlix as directed. 66M Medipore H Soft Cloth Surgical Tape, 4  x 10 (in/yd) Discharge Instruction: Secure with tape as directed. Compression Wrap Compression Stockings Add-Ons Electronic Signature(s) Signed: 12/21/2020 6:08:54 PM By: Baruch Gouty RN, BSN Signed: 12/21/2020 6:10:28 PM By: Deon Pilling RN, BSN Entered By: Deon Pilling on 12/21/2020 14:43:07 -------------------------------------------------------------------------------- Wound Assessment Details Patient Name: Date of Service: [name] [name], [name] [name]. 12/21/2020 2:15 PM Medical Record Number: [id number] Patient Account Number: [id number] Date of Birth/Sex: Treating RN: November 06, 1926 (87 y.o. [name] Primary Care Tawan Degroote: [name] Other Clinician: Referring Kenzly Rogoff: Treating Carisa Backhaus/Extender: Eli Hose in Treatment: 13 Wound Status Wound Number: 5 Primary Venous Leg Ulcer Etiology: Wound Location: Left, Anterior Lower Leg Wound Open Wounding Event: Gradually Appeared Status: Date Acquired: 11/16/2020 Comorbid Cataracts, Sleep Apnea, Arrhythmia, Congestive Heart Failure, Weeks Of Treatment: 5 History: Coronary Artery Disease, Hypertension, Myocardial Infarction, Clustered Wound: No End Stage Renal Disease, Gout, Dementia,  Neuropathy Photos Wound Measurements Length: (cm) Width: (cm) Depth: (cm) Area: (cm) Volume: (cm) 0 % Reduction in Area: 100% 0 % Reduction in Volume: 100% 0 Epithelialization: None 0 0 Wound Description Classification: Full Thickness Without Exposed Support Structures Wound Margin: Distinct, outline attached Exudate Amount: Medium Exudate Type: Serosanguineous Exudate Color: red, brown Foul Odor After Cleansing: No Slough/Fibrino No Wound Bed Granulation Amount: Large (67-100%) Exposed Structure Granulation Quality: Red Fascia Exposed: No Necrotic Amount: None Present (0%) Fat Layer (Subcutaneous Tissue) Exposed: Yes Tendon Exposed: No Muscle Exposed: No Joint Exposed: No Bone Exposed: No Electronic Signature(s) Signed: 12/21/2020 6:08:54 PM By: Baruch Gouty RN, BSN Signed: 12/22/2020 9:32:36 AM By: Sandre Kitty Entered By: Sandre Kitty on 12/21/2020 14:39:31 -------------------------------------------------------------------------------- Wound Assessment Details Patient Name: Date of Service: [name] [name], [name] [name]. 12/21/2020 2:15 PM Medical Record Number: [id number] Patient Account Number: [id number] Date of Birth/Sex: Treating RN: 01-Sep-1926 (87 y.o. [name] Primary Care Briane Birden: [name] Other Clinician: Referring Aundre Hietala: Treating Noele Icenhour/Extender: Eli Hose in Treatment: 13 Wound Status Wound Number: 6 Primary Arterial Insufficiency Ulcer Etiology: Wound Location: Right, Lateral Ankle Wound Open Wounding Event: Gradually Appeared Status: Date Acquired: 11/22/2020 Comorbid Cataracts, Sleep Apnea, Arrhythmia, Congestive Heart Failure, Weeks Of Treatment: 0 History: Coronary Artery Disease, Hypertension, Myocardial Infarction, Clustered Wound: No End Stage Renal Disease, Gout, Dementia, Neuropathy Photos Wound Measurements Length: (cm) 0.7 Width: (cm) 0.5 Depth: (cm) 0.5 Area: (cm)  0.275 Volume: (cm) 0.137 % Reduction in Area: 0% % Reduction in Volume: 0% Epithelialization: None Tunneling: No Undermining: No Wound Description Classification: Unclassifiable Wound Margin: Distinct, outline attached Exudate Amount: Medium Exudate Type: Serosanguineous Exudate Color: red, brown Foul Odor After Cleansing: No Slough/Fibrino No Wound Bed Necrotic Amount: Large (67-100%) Exposed Structure Necrotic Quality: Eschar, Adherent Slough Fascia Exposed: No Fat Layer (Subcutaneous Tissue) Exposed: No Tendon Exposed: No Muscle Exposed: No Joint Exposed: No Bone Exposed: No Treatment Notes Wound #6 (Ankle) Wound Laterality: Right, Lateral Cleanser Peri-Wound Care Topical Primary Dressing Secondary Dressing Woven Gauze Sponge, Non-Sterile 4x4 in Discharge Instruction: Apply over primary dressing as directed. Secured With [employer], 4.5x3.1 (in/yd) Discharge Instruction: Secure with Kerlix as directed. 70M Medipore H Soft Cloth Surgical T ape, 4 x 10 (in/yd) Discharge Instruction: Secure with tape as directed. Compression Wrap Compression Stockings Add-Ons Electronic Signature(s) Signed: 12/21/2020 6:08:54 PM By: Baruch Gouty RN, BSN Signed: 12/21/2020 6:10:28 PM By: Deon Pilling RN, BSN Entered By: Deon Pilling on 12/21/2020 14:46:23 -------------------------------------------------------------------------------- Vitals Details Patient Name: Date of Service: [name] [name], [name] [name]. 12/21/2020 2:15  PM Medical Record Number: [id number] Patient Account Number: [id number] Date of Birth/Sex: Treating RN: 1926-09-20 (87 y.o. [name] Primary Care Lashica Hannay: [name] Other Clinician: Referring Sherolyn Trettin: Treating Emory Gallentine/Extender: Eli Hose in Treatment: 13 Vital Signs Time Taken: 14:25 Temperature (F): 97.7 Height (in): 72 Pulse (bpm): 65 Weight (lbs): 223 Respiratory Rate (breaths/min): 20 Body Mass  Index (BMI): 30.2 Blood Pressure (mmHg): 130/58 Reference Range: 80 - 120 mg / dl Electronic Signature(s) Signed: 12/21/2020 6:10:28 PM By: Deon Pilling RN, BSN Entered By: Deon Pilling on 12/21/2020 14:37:47

## 2020-12-23 LAB — ALLERGEN STINGING INSECT PANEL
Honeybee IgE: <0.1 kU/L
Hornet, White Face, IgE: 0.3 kU/L — AB
Hornet, Yellow, IgE: 0.21 kU/L — AB
Paper Wasp IgE: 0.2 kU/L — AB
Yellow Jacket, IgE: 0.19 kU/L — AB

## 2020-12-23 LAB — TRYPTASE: Tryptase: 4.5 ug/L (ref 2.2–13.2)

## 2020-12-26 DIAGNOSIS — I5021 Acute systolic (congestive) heart failure: Secondary | ICD-10-CM | POA: Diagnosis not present

## 2020-12-26 DIAGNOSIS — I259 Chronic ischemic heart disease, unspecified: Secondary | ICD-10-CM | POA: Diagnosis not present

## 2020-12-26 DIAGNOSIS — I4891 Unspecified atrial fibrillation: Secondary | ICD-10-CM | POA: Diagnosis not present

## 2020-12-26 DIAGNOSIS — G4733 Obstructive sleep apnea (adult) (pediatric): Secondary | ICD-10-CM | POA: Diagnosis not present

## 2020-12-27 ENCOUNTER — Other Ambulatory Visit (HOSPITAL_COMMUNITY): Payer: Self-pay | Admitting: Cardiovascular Disease

## 2020-12-27 ENCOUNTER — Ambulatory Visit (HOSPITAL_COMMUNITY): Payer: Medicare Other | Attending: Cardiovascular Disease

## 2020-12-27 ENCOUNTER — Other Ambulatory Visit: Payer: Self-pay

## 2020-12-27 DIAGNOSIS — Z953 Presence of xenogenic heart valve: Secondary | ICD-10-CM

## 2020-12-27 DIAGNOSIS — Z952 Presence of prosthetic heart valve: Secondary | ICD-10-CM

## 2020-12-27 LAB — ECHOCARDIOGRAM COMPLETE
AV Mean grad: 9.5 mmHg
AV Peak grad: 16.8 mmHg
Ao pk vel: 2.05 m/s
Area-P 1/2: 2.8 cm2
P 1/2 time: 331 msec
S' Lateral: 3.3 cm

## 2020-12-28 NOTE — Progress Notes (Signed)
Echocardiogram 12/27/2020:  1. Left ventricular ejection fraction, by estimation, is 60 to 65%. The left ventricle has normal function. The left ventricle has no regional wall motion abnormalities. Left ventricular diastolic parameters are consistent with Grade II diastolic  dysfunction (pseudonormalization). Elevated left ventricular end-diastolic pressure.  2. Right ventricular systolic function is normal. The right ventricular size is normal. Tricuspid regurgitation signal is inadequate for assessing PA pressure.  3. Left atrial size was mildly dilated.  4. Right atrial size was mildly dilated.  5. The mitral valve is normal in structure. Trivial mitral valve regurgitation. No evidence of mitral stenosis.  6. Trivial intravalvular regurgitation. The aortic valve has been repaired/replaced. Aortic valve regurgitation is trivial. No aortic stenosis is present. Aortic valve mean gradient measures 9.5 mmHg. Aortic valve Vmax measures 2.05 m/s.  7. Aortic dilatation noted. There is moderate dilatation of the aortic root, measuring 44 mm. There is moderate dilatation of the ascending aorta, measuring 46 mm.  8. The inferior vena cava is dilated in size with >50% respiratory variability, suggesting right atrial pressure of 8 mmHg.

## 2020-12-30 DIAGNOSIS — I2510 Atherosclerotic heart disease of native coronary artery without angina pectoris: Secondary | ICD-10-CM | POA: Diagnosis not present

## 2020-12-30 DIAGNOSIS — I10 Essential (primary) hypertension: Secondary | ICD-10-CM | POA: Diagnosis not present

## 2020-12-30 DIAGNOSIS — E785 Hyperlipidemia, unspecified: Secondary | ICD-10-CM | POA: Diagnosis not present

## 2020-12-30 DIAGNOSIS — M5416 Radiculopathy, lumbar region: Secondary | ICD-10-CM | POA: Diagnosis not present

## 2020-12-30 DIAGNOSIS — N1831 Chronic kidney disease, stage 3a: Secondary | ICD-10-CM | POA: Diagnosis not present

## 2021-01-04 ENCOUNTER — Other Ambulatory Visit: Payer: Self-pay

## 2021-01-04 ENCOUNTER — Encounter (HOSPITAL_BASED_OUTPATIENT_CLINIC_OR_DEPARTMENT_OTHER): Payer: Medicare Other | Attending: Physician Assistant | Admitting: Physician Assistant

## 2021-01-04 DIAGNOSIS — I129 Hypertensive chronic kidney disease with stage 1 through stage 4 chronic kidney disease, or unspecified chronic kidney disease: Secondary | ICD-10-CM | POA: Diagnosis not present

## 2021-01-04 DIAGNOSIS — N401 Enlarged prostate with lower urinary tract symptoms: Secondary | ICD-10-CM | POA: Insufficient documentation

## 2021-01-04 DIAGNOSIS — I70233 Atherosclerosis of native arteries of right leg with ulceration of ankle: Secondary | ICD-10-CM | POA: Diagnosis not present

## 2021-01-04 DIAGNOSIS — I2510 Atherosclerotic heart disease of native coronary artery without angina pectoris: Secondary | ICD-10-CM | POA: Insufficient documentation

## 2021-01-04 DIAGNOSIS — I872 Venous insufficiency (chronic) (peripheral): Secondary | ICD-10-CM | POA: Insufficient documentation

## 2021-01-04 DIAGNOSIS — I252 Old myocardial infarction: Secondary | ICD-10-CM | POA: Insufficient documentation

## 2021-01-04 DIAGNOSIS — L97822 Non-pressure chronic ulcer of other part of left lower leg with fat layer exposed: Secondary | ICD-10-CM | POA: Diagnosis not present

## 2021-01-04 DIAGNOSIS — I7389 Other specified peripheral vascular diseases: Secondary | ICD-10-CM | POA: Insufficient documentation

## 2021-01-04 DIAGNOSIS — L97312 Non-pressure chronic ulcer of right ankle with fat layer exposed: Secondary | ICD-10-CM | POA: Diagnosis not present

## 2021-01-04 DIAGNOSIS — L89613 Pressure ulcer of right heel, stage 3: Secondary | ICD-10-CM | POA: Diagnosis not present

## 2021-01-04 DIAGNOSIS — N183 Chronic kidney disease, stage 3 unspecified: Secondary | ICD-10-CM | POA: Insufficient documentation

## 2021-01-04 DIAGNOSIS — L97322 Non-pressure chronic ulcer of left ankle with fat layer exposed: Secondary | ICD-10-CM | POA: Diagnosis not present

## 2021-01-04 NOTE — Progress Notes (Addendum)
REYNOLDO, [name] [name] IMarland Kitchen ([id number]) Visit Report for 01/04/2021 Chief Complaint Document Details Patient Name: Date of Service: [name] [name] ID I. 01/04/2021 2:45 PM Medical Record Number: [id number] Patient Account Number: [id number] Date of Birth/Sex: Treating RN: 27-Mar-1926 (88 y.o. [name] [name] Primary Care Provider: [name] Other Clinician: Referring Provider: Treating Provider/Extender: Eli Hose in Treatment: 15 Information Obtained from: Patient Chief Complaint Pressure ulcer right heel and left LE and ankle ulcers Electronic Signature(s) Signed: 01/04/2021 3:15:15 PM By: [name] PA-C Entered By: [name] on 01/04/2021 15:15:15 -------------------------------------------------------------------------------- Debridement Details Patient Name: Date of Service: [name] [name], [name] 01/04/2021 2:45 PM Medical Record Number: [id number] Patient Account Number: [id number] Date of Birth/Sex: Treating RN: 1926/07/23 (88 y.o. [name] [name], [name] Primary Care Provider: [name] Other Clinician: Referring Provider: Treating Provider/Extender: Eli Hose in Treatment: 15 Debridement Performed for Assessment: Wound #1 Right,Lateral Calcaneus Performed By: Physician [name], PA Debridement Type: Debridement Level of Consciousness (Pre-procedure): Awake and Alert Pre-procedure Verification/Time Out Yes - 15:35 Taken: Start Time: 15:36 Pain Control: Other : benzocaine 20% T Area Debrided (L x W): otal 1 (cm) x 1 (cm) = 1 (cm) Tissue and other material debrided: Viable, Non-Viable, Callus, Slough, Subcutaneous, Skin: Dermis , Skin: Epidermis, Fibrin/Exudate, Slough Level: Skin/Subcutaneous Tissue Debridement Description: Excisional Instrument: Curette Bleeding: Minimum Hemostasis Achieved: Pressure End Time: 15:41 Procedural Pain: 0 Post Procedural Pain: 0 Response to Treatment: Procedure was tolerated  well Level of Consciousness (Post- Awake and Alert procedure): Post Debridement Measurements of Total Wound Length: (cm) 0.7 Stage: Category/Stage III Width: (cm) 0.7 Depth: (cm) 0.1 Volume: (cm) 0.038 Character of Wound/Ulcer Post Debridement: Improved Post Procedure Diagnosis Same as Pre-procedure Electronic Signature(s) Signed: 01/04/2021 4:51:59 PM By: [name] PA-C Signed: 01/04/2021 5:44:25 PM By: Deon Pilling RN, BSN Entered By: Deon Pilling on 01/04/2021 15:42:16 -------------------------------------------------------------------------------- Debridement Details Patient Name: Date of Service: [name] [name], [name] 01/04/2021 2:45 PM Medical Record Number: [id number] Patient Account Number: [id number] Date of Birth/Sex: Treating RN: [birth year] (88 y.o. [name] [name], [name] Primary Care Provider: [name] Other Clinician: Referring Provider: Treating Provider/Extender: Eli Hose in Treatment: 15 Debridement Performed for Assessment: Wound #6 Right,Lateral Ankle Performed By: Physician [name], PA Debridement Type: Debridement Severity of Tissue Pre Debridement: Fat layer exposed Level of Consciousness (Pre-procedure): Awake and Alert Pre-procedure Verification/Time Out Yes - 15:35 Taken: Start Time: 15:36 Pain Control: Other : benzocaine 20% T Area Debrided (L x W): otal 1.2 (cm) x 0.7 (cm) = 0.84 (cm) Tissue and other material debrided: Viable, Non-Viable, Eschar, Subcutaneous, Skin: Dermis , Skin: Epidermis, Fibrin/Exudate Level: Skin/Subcutaneous Tissue Debridement Description: Excisional Instrument: Curette Bleeding: Minimum Hemostasis Achieved: Pressure End Time: 15:41 Procedural Pain: 0 Post Procedural Pain: 0 Response to Treatment: Procedure was tolerated well Level of Consciousness (Post- Awake and Alert procedure): Post Debridement Measurements of Total Wound Length: (cm) 1.2 Width: (cm) 0.7 Depth: (cm)  0.1 Volume: (cm) 0.066 Character of Wound/Ulcer Post Debridement: Improved Severity of Tissue Post Debridement: Fat layer exposed Post Procedure Diagnosis Same as Pre-procedure Electronic Signature(s) Signed: 01/04/2021 4:51:59 PM By: [name] PA-C Signed: 01/04/2021 5:44:25 PM By: Deon Pilling RN, BSN Entered By: Deon Pilling on 01/04/2021 15:44:52 -------------------------------------------------------------------------------- Debridement Details Patient Name: Date of Service: [name] [name], [name] 01/04/2021 2:45 PM Medical Record Number: [id number] Patient Account Number: [id number] Date of  Birth/Sex: Treating RN: 1926/04/14 (88 y.o. [name] [name], [name] Primary Care Provider: [name] Other Clinician: Referring Provider: Treating Provider/Extender: Eli Hose in Treatment: 15 Debridement Performed for Assessment: Wound #4 Left,Medial Lower Leg Performed By: Physician [name], PA Debridement Type: Debridement Level of Consciousness (Pre-procedure): Awake and Alert Pre-procedure Verification/Time Out Yes - 15:35 Taken: Start Time: 15:36 Pain Control: Other : benzocaine 20% T Area Debrided (L x W): otal 1.4 (cm) x 1.2 (cm) = 1.68 (cm) Tissue and other material debrided: Viable, Non-Viable, Callus, Slough, Subcutaneous, Skin: Dermis , Skin: Epidermis, Fibrin/Exudate, Slough Level: Skin/Subcutaneous Tissue Debridement Description: Excisional Instrument: Curette Bleeding: Minimum Hemostasis Achieved: Pressure End Time: 15:41 Procedural Pain: 0 Post Procedural Pain: 0 Response to Treatment: Procedure was tolerated well Level of Consciousness (Post- Awake and Alert procedure): Post Debridement Measurements of Total Wound Length: (cm) 1.4 Width: (cm) 1.2 Depth: (cm) 0.3 Volume: (cm) 0.396 Character of Wound/Ulcer Post Debridement: Improved Post Procedure Diagnosis Same as Pre-procedure Electronic Signature(s) Signed: 01/04/2021  4:51:59 PM By: [name] PA-C Signed: 01/04/2021 5:44:25 PM By: Deon Pilling RN, BSN Entered By: Deon Pilling on 01/04/2021 15:45:26 -------------------------------------------------------------------------------- Debridement Details Patient Name: Date of Service: [name] [name], [name] 01/04/2021 2:45 PM Medical Record Number: [id number] Patient Account Number: [id number] Date of Birth/Sex: Treating RN: 01-Oct-1926 (88 y.o. [name] [name], [name] Primary Care Provider: [name] Other Clinician: Referring Provider: Treating Provider/Extender: Eli Hose in Treatment: 15 Debridement Performed for Assessment: Wound #3 Left,Lateral Ankle Performed By: Physician [name], PA Debridement Type: Debridement Severity of Tissue Pre Debridement: Fat layer exposed Level of Consciousness (Pre-procedure): Awake and Alert Pre-procedure Verification/Time Out Yes - 15:35 Taken: Start Time: 15:36 Pain Control: Other : benzocaine 20% T Area Debrided (L x W): otal 1.7 (cm) x 1.1 (cm) = 1.87 (cm) Tissue and other material debrided: Viable, Non-Viable, Callus, Slough, Subcutaneous, Skin: Dermis , Skin: Epidermis, Fibrin/Exudate, Slough Level: Skin/Subcutaneous Tissue Debridement Description: Excisional Instrument: Curette Bleeding: Minimum Hemostasis Achieved: Pressure End Time: 15:41 Procedural Pain: 0 Post Procedural Pain: 0 Response to Treatment: Procedure was tolerated well Level of Consciousness (Post- Awake and Alert procedure): Post Debridement Measurements of Total Wound Length: (cm) 1.7 Width: (cm) 1.1 Depth: (cm) 0.2 Volume: (cm) 0.294 Character of Wound/Ulcer Post Debridement: Improved Severity of Tissue Post Debridement: Fat layer exposed Post Procedure Diagnosis Same as Pre-procedure Electronic Signature(s) Signed: 01/04/2021 4:51:59 PM By: [name] PA-C Signed: 01/04/2021 5:44:25 PM By: Deon Pilling RN, BSN Entered By: Deon Pilling  on 01/04/2021 15:46:22 -------------------------------------------------------------------------------- Debridement Details Patient Name: Date of Service: [name] [name], [name] 01/04/2021 2:45 PM Medical Record Number: [id number] Patient Account Number: [id number] Date of Birth/Sex: Treating RN: November 08, 1926 (88 y.o. [name] Primary Care Provider: [name] Other Clinician: Referring Provider: Treating Provider/Extender: Eli Hose in Treatment: 15 Debridement Performed for Assessment: Wound #2 Right,Medial Calcaneus Performed By: Clinician Deon Pilling, RN Debridement Type: Chemical/Enzymatic/Mechanical Agent Used: Santyl Level of Consciousness (Pre-procedure): Awake and Alert Pre-procedure Verification/Time Out No Taken: Bleeding: None Response to Treatment: Procedure was tolerated well Level of Consciousness (Post- Awake and Alert procedure): Post Debridement Measurements of Total Wound Length: (cm) 2 Stage: Category/Stage III Width: (cm) 4 Depth: (cm) 0.1 Volume: (cm) 0.628 Character of Wound/Ulcer Post Debridement: Requires Further Debridement Post Procedure Diagnosis Same as Pre-procedure Electronic Signature(s) Signed: 01/04/2021 4:51:59 PM By: [name] PA-C Signed: 01/04/2021 5:44:25  PM By: Deon Pilling RN, BSN Entered By: Deon Pilling on 01/04/2021 16:22:48 -------------------------------------------------------------------------------- HPI Details Patient Name: Date of Service: [name] [name], [name] 01/04/2021 2:45 PM Medical Record Number: [id number] Patient Account Number: [id number] Date of Birth/Sex: Treating RN: October 17, 1926 (88 y.o. [name] [name] Primary Care Provider: [name] Other Clinician: Referring Provider: Treating Provider/Extender: Eli Hose in Treatment: 15 History of Present Illness HPI Description: 09/21/2020 upon evaluation today patient appears to be doing somewhat  poorly in regard to a wound in [name] right lateral heel location. This fortunately does not appear to be too bad but nonetheless is definitely a spot that we need to work on try to get healed for him. Has been here for quite some time. Fortunately there does not appear to be any evidence of active infection at this time which is great news. With that being said I do believe that he has some evidence here of peripheral vascular disease this is minimal his ABI 0.87. Other than that he does have a history of BPH, chronic kidney disease stage III, and hypertension. Currently he has been using different medication such as Silvadene or antibiotic ointment to try to help treat things. This just is not helping out as efficiently as it should be. Of note patient's history includes that of a triple bypass in 2010, a kyphoplasty at L1 which was done 8 months ago, unfortunately he had a fracture 2 months ago which she sustained a T7/T8 fracture to his spine which ended up with him being in [name] hospital. It is in that time since then that he developed this wound roughly 2 to 3 months ago is not exactly sure when. He does typically wear compression socks in [name] 20 to 30 mmHg range. 09/28/2020 upon evaluation today patient presents for follow-up and overall seems to be doing more poorly based on what I am seeing currently. His ABI last time was measured at 0.87 but I am beginning to think this may be falsely elevated due to [name] fact that there is more eschar and subsequently a concern here of vascular compromise that may be part of her issue. I think that we need to avoid any additional sharp debridement and make a referral to vascular surgery as soon as possible here. Subsequently also went ahead and did discuss [name] possibility of starting [name] patient on Santyl instead of [name] collagen in order to help clean up [name] surface of [name] wound this will take [name] place of sharp debridement currently. 10/19/2020 upon evaluation  today patient's wound actually appears to be doing okay although he has an area on [name] medial aspect of his heel that is only been showing up for [name] past couple of days this is more of a blister that has ruptured. [name] good news is there is no deep wound here which is good. Fortunately there is no signs of active infection at this time also good news. With that being said however [name] blood flow was not good with an ABI of 0.67 on [name] right it was around 1 on [name] left nonetheless I think he does need to see vascular we put that referral in last week on [name] 12th Dr. Dellia Nims reviewed that for me and made that recommendations. Nonetheless [name] patient has not heard anything from vascular yet as far as getting this scheduled. I think this needs to be done sooner rather than later as I am concerned about [name] possibility of a  limb threatening situation if we do not get this under control.. 11/16/2020 upon evaluation today patient presents for follow-up regarding issues that he has been having with his bilateral lower extremities in particular. He in fact still has an issue with his right heel. This appears to be a stage III pressure ulcer at this point there is some slough noted. Subsequently he does have revascularization that is taking place on [name] right leg as a follow-up later this month in that regard. No repeat ABIs. Subsequently [name] patient also has venous stasis bilaterally which is also problematic for him at this time. He has some ulcers on [name] left ankle and left leg that are getting need to be addressed as well. 12/21/2020 upon evaluation today patient appears to be doing poorly in regard to his bilateral lower extremity ulcers. He still has areas on both lower extremities currently ineffective even some areas that were not there previous. Fortunately there is no signs of active infection at this time. No fevers, chills, nausea, vomiting, or diarrhea. 01/04/21 upon evaluation today patient actually  appears to be doing quite well in regard to his wounds. Fortunately there does not appear to be any signs of active infection which is great. There is some necrotic tissue that I think would be helpful to debride away pretty much on all wounds other than [name] heel which appears to be doing quite well. Electronic Signature(s) Signed: 01/04/2021 3:50:51 PM By: [name] PA-C Entered By: [name] on 01/04/2021 15:50:50 -------------------------------------------------------------------------------- Physical Exam Details Patient Name: Date of Service: [name] [name], [name] 01/04/2021 2:45 PM Medical Record Number: [id number] Patient Account Number: [id number] Date of Birth/Sex: Treating RN: [birth year] (88 y.o. [name] [name] Primary Care Provider: [name] Other Clinician: Referring Provider: Treating Provider/Extender: [name] in Treatment: 46 Constitutional Well-nourished and well-hydrated in no acute distress. Respiratory normal breathing without difficulty. Psychiatric this patient is able to make decisions and demonstrates good insight into disease process. Alert and Oriented x 3. pleasant and cooperative. Notes Upon inspection patient's wounds again did require some sharp debridement pretty much at all locations except for his right heel. All he tolerated without complication and postdebridement appear to be doing significantly better I think this will help [name] Santyl work even better with [name] [employer]. He seems to have excellent blood flow bilaterally in my opinion. Electronic Signature(s) Signed: 01/04/2021 3:51:11 PM By: [name] PA-C Entered By: [name] on 01/04/2021 15:51:10 -------------------------------------------------------------------------------- Physician Orders Details Patient Name: Date of Service: [name] [name], [name] 01/04/2021 2:45 PM Medical Record Number: [id number] Patient Account Number:  [id number] Date of Birth/Sex: Treating RN: [birth year] (88 y.o. [name] [name], [name] Primary Care Provider: [name] Other Clinician: Referring Provider: Treating Provider/Extender: Eli Hose in Treatment: 15 Verbal / Phone Orders: No Diagnosis Coding ICD-10 Coding Code Description [phone] Pressure ulcer of right heel, stage 3 I73.89 Other specified peripheral vascular diseases I87.2 Venous insufficiency (chronic) (peripheral) L97.822 Non-pressure chronic ulcer of other part of left lower leg with fat layer exposed L97.312 Non-pressure chronic ulcer of right ankle with fat layer exposed N40.1 Benign prostatic hyperplasia with lower urinary tract symptoms N18.30 Chronic kidney disease, stage 3 unspecified I10 Essential (primary) hypertension Follow-up Appointments ppointment in 2 weeks. Margarita Grizzle Wednesday ***Extra time 60 minutes*** Return A Other: - [employer]- supplies. Bathing/ Shower/ Hygiene May shower and wash wound with soap and  water. - with dressing changes. Edema Control - Lymphedema / SCD / Other Elevate legs to [name] level of [name] heart or above for 30 minutes daily and/or when sitting, a frequency of: - throughout [name] day. Avoid standing for long periods of time. Moisturize legs daily. - both legs and feet with dressing changes. Off-Loading Other: - bunny boots to both feet while resting in bed or chair. Ensure no pressure to wounds to aid in healing. Additional Orders / Instructions Other: - May continue to walk and work with physical therapy. Wound Treatment Wound #1 - Calcaneus Wound Laterality: Right, Lateral Cleanser: Soap and Water Every Other Day/30 Days Discharge Instructions: May shower and wash wound with dial antibacterial soap and water prior to dressing change. Prim Dressing: Hydrofera Blue Classic Foam, 2x2 in (Generic) Every Other Day/30 Days ary Discharge Instructions: Moisten with saline prior to applying over [name]  santyl. Prim Dressing: Santyl Ointment Every Other Day/30 Days ary Discharge Instructions: Apply nickel thick amount directly to wound bed. Secondary Dressing: Woven Gauze Sponge, Non-Sterile 4x4 in (Generic) Every Other Day/30 Days Discharge Instructions: Apply over primary dressing as directed. Secured With: [name] [employer], 4.5x3.1 (in/yd) (Generic) Every Other Day/30 Days Discharge Instructions: Secure with Kerlix as directed. Secured With: 46M Medipore H Soft Cloth Surgical T ape, 4 x 10 (in/yd) (Generic) Every Other Day/30 Days Discharge Instructions: Secure with tape as directed. Wound #2 - Calcaneus Wound Laterality: Right, Medial Cleanser: Soap and Water Every Other Day/30 Days Discharge Instructions: May shower and wash wound with dial antibacterial soap and water prior to dressing change. Prim Dressing: Hydrofera Blue Classic Foam, 2x2 in (Generic) Every Other Day/30 Days ary Discharge Instructions: Moisten with saline prior to applying over [name] santyl. Prim Dressing: Santyl Ointment Every Other Day/30 Days ary Discharge Instructions: Apply nickel thick amount directly to wound bed. Secondary Dressing: Woven Gauze Sponge, Non-Sterile 4x4 in (Generic) Every Other Day/30 Days Discharge Instructions: Apply over primary dressing as directed. Secured With: [name] [employer], 4.5x3.1 (in/yd) (Generic) Every Other Day/30 Days Discharge Instructions: Secure with Kerlix as directed. Secured With: 46M Medipore H Soft Cloth Surgical T ape, 4 x 10 (in/yd) (Generic) Every Other Day/30 Days Discharge Instructions: Secure with tape as directed. Wound #3 - Ankle Wound Laterality: Left, Lateral Cleanser: Soap and Water Every Other Day/30 Days Discharge Instructions: May shower and wash wound with dial antibacterial soap and water prior to dressing change. Prim Dressing: Hydrofera Blue Classic Foam, 2x2 in (Generic) Every Other Day/30 Days ary Discharge Instructions: Moisten with saline  prior to applying over [name] santyl. Prim Dressing: Santyl Ointment Every Other Day/30 Days ary Discharge Instructions: Apply nickel thick amount directly to wound bed. Secondary Dressing: Woven Gauze Sponge, Non-Sterile 4x4 in (Generic) Every Other Day/30 Days Discharge Instructions: Apply over primary dressing as directed. Secured With: [name] [employer], 4.5x3.1 (in/yd) (Generic) Every Other Day/30 Days Discharge Instructions: Secure with Kerlix as directed. Secured With: 46M Medipore H Soft Cloth Surgical T ape, 4 x 10 (in/yd) (Generic) Every Other Day/30 Days Discharge Instructions: Secure with tape as directed. Wound #4 - Lower Leg Wound Laterality: Left, Medial Cleanser: Soap and Water Every Other Day/30 Days Discharge Instructions: May shower and wash wound with dial antibacterial soap and water prior to dressing change. Prim Dressing: Hydrofera Blue Classic Foam, 2x2 in (Generic) Every Other Day/30 Days ary Discharge Instructions: Moisten with saline prior to applying over [name] santyl. Prim Dressing: Santyl Ointment Every Other Day/30 Days ary Discharge Instructions: Apply nickel thick amount  directly to wound bed. Secondary Dressing: Woven Gauze Sponge, Non-Sterile 4x4 in (Generic) Every Other Day/30 Days Discharge Instructions: Apply over primary dressing as directed. Secured With: [name] [employer], 4.5x3.1 (in/yd) (Generic) Every Other Day/30 Days Discharge Instructions: Secure with Kerlix as directed. Secured With: 22M Medipore H Soft Cloth Surgical T ape, 4 x 10 (in/yd) (Generic) Every Other Day/30 Days Discharge Instructions: Secure with tape as directed. Wound #6 - Ankle Wound Laterality: Right, Lateral Cleanser: Soap and Water Every Other Day/30 Days Discharge Instructions: May shower and wash wound with dial antibacterial soap and water prior to dressing change. Prim Dressing: Hydrofera Blue Classic Foam, 2x2 in (Generic) Every Other Day/30 Days ary Discharge  Instructions: Moisten with saline prior to applying over [name] santyl. Prim Dressing: Santyl Ointment Every Other Day/30 Days ary Discharge Instructions: Apply nickel thick amount directly to wound bed. Secondary Dressing: Woven Gauze Sponge, Non-Sterile 4x4 in (Generic) Every Other Day/30 Days Discharge Instructions: Apply over primary dressing as directed. Secured With: [name] [employer], 4.5x3.1 (in/yd) (Generic) Every Other Day/30 Days Discharge Instructions: Secure with Kerlix as directed. Secured With: 22M Medipore H Soft Cloth Surgical Tape, 4 x 10 (in/yd) (Generic) Every Other Day/30 Days Discharge Instructions: Secure with tape as directed. Patient Medications llergies: bee venom protein (honey bee), amoxicillin, morphine A Notifications Medication Indication Start End 01/04/2021 Santyl DOSE topical 250 unit/gram ointment - ointment topical Apply nickel thick daily to [name] wound bed and then cover with a dressing as directed in clinic x 30 days Electronic Signature(s) Signed: 01/04/2021 4:35:00 PM By: [name] PA-C Entered By: [name] on 01/04/2021 16:34:58 -------------------------------------------------------------------------------- Problem List Details Patient Name: Date of Service: [name] [name], [name] 01/04/2021 2:45 PM Medical Record Number: [id number] Patient Account Number: [id number] Date of Birth/Sex: Treating RN: 04-Apr-1926 (88 y.o. [name] [name] Primary Care Provider: [name] Other Clinician: Referring Provider: Treating Provider/Extender: Eli Hose in Treatment: 15 Active Problems ICD-10 Encounter Code Description Active Date MDM Diagnosis L89.613 Pressure ulcer of right heel, stage 3 09/21/2020 No Yes I73.89 Other specified peripheral vascular diseases 09/21/2020 No Yes I87.2 Venous insufficiency (chronic) (peripheral) 11/16/2020 No Yes L97.822 Non-pressure chronic ulcer of other part of left lower leg with  fat layer 11/16/2020 No Yes exposed L97.312 Non-pressure chronic ulcer of right ankle with fat layer exposed 11/16/2020 No Yes N40.1 Benign prostatic hyperplasia with lower urinary tract symptoms 09/21/2020 No Yes N18.30 Chronic kidney disease, stage 3 unspecified 09/21/2020 No Yes I10 Essential (primary) hypertension 09/21/2020 No Yes Inactive Problems Resolved Problems Electronic Signature(s) Signed: 01/04/2021 3:15:07 PM By: [name] PA-C Entered By: [name] on 01/04/2021 15:15:06 -------------------------------------------------------------------------------- Progress Note Details Patient Name: Date of Service: [name] [name], [name] 01/04/2021 2:45 PM Medical Record Number: [id number] Patient Account Number: [id number] Date of Birth/Sex: Treating RN: [birth year] (88 y.o. [name] [name] Primary Care Provider: [name] Other Clinician: Referring Provider: Treating Provider/Extender: Eli Hose in Treatment: 15 Subjective Chief Complaint Information obtained from Patient Pressure ulcer right heel and left LE and ankle ulcers History of Present Illness (HPI) 09/21/2020 upon evaluation today patient appears to be doing somewhat poorly in regard to a wound in [name] right lateral heel location. This fortunately does not appear to be too bad but nonetheless is definitely a spot that we need to work on try to get healed for him. Has been here for quite some time. Fortunately there  does not appear to be any evidence of active infection at this time which is great news. With that being said I do believe that he has some evidence here of peripheral vascular disease this is minimal his ABI 0.87. Other than that he does have a history of BPH, chronic kidney disease stage III, and hypertension. Currently he has been using different medication such as Silvadene or antibiotic ointment to try to help treat things. This just is not helping out as efficiently as it  should be. Of note patient's history includes that of a triple bypass in 2010, a kyphoplasty at L1 which was done 8 months ago, unfortunately he had a fracture 2 months ago which she sustained a T7/T8 fracture to his spine which ended up with him being in [name]. It is in that time since then that he developed this wound roughly 2 to 3 months ago is not exactly sure when. He does typically wear compression socks in [name] 20 to 30 mmHg range. 09/28/2020 upon evaluation today patient presents for follow-up and overall seems to be doing more poorly based on what I am seeing currently. His ABI last time was measured at 0.87 but I am beginning to think this may be falsely elevated due to [name] fact that there is more eschar and subsequently a concern here of vascular compromise that may be part of her issue. I think that we need to avoid any additional sharp debridement and make a referral to vascular surgery as soon as possible here. Subsequently also went ahead and did discuss [name] possibility of starting [name] patient on Santyl instead of [name] collagen in order to help clean up [name] surface of [name] wound this will take [name] place of sharp debridement currently. 10/19/2020 upon evaluation today patient's wound actually appears to be doing okay although he has an area on [name] medial aspect of his heel that is only been showing up for [name] past couple of days this is more of a blister that has ruptured. [name] good news is there is no deep wound here which is good. Fortunately there is no signs of active infection at this time also good news. With that being said however [name] blood flow was not good with an ABI of 0.67 on [name] right it was around 1 on [name] left nonetheless I think he does need to see vascular we put that referral in last week on [name] 12th Dr. Dellia Nims reviewed that for me and made that recommendations. Nonetheless [name] patient has not heard anything from vascular yet as far as getting this scheduled. I think  this needs to be done sooner rather than later as I am concerned about [name] possibility of a limb threatening situation if we do not get this under control.. 11/16/2020 upon evaluation today patient presents for follow-up regarding issues that he has been having with his bilateral lower extremities in particular. He in fact still has an issue with his right heel. This appears to be a stage III pressure ulcer at this point there is some slough noted. Subsequently he does have revascularization that is taking place on [name] right leg as a follow-up later this month in that regard. No repeat ABIs. Subsequently [name] patient also has venous stasis bilaterally which is also problematic for him at this time. He has some ulcers on [name] left ankle and left leg that are getting need to be addressed as well. 12/21/2020 upon evaluation today patient appears to be doing poorly in regard  to his bilateral lower extremity ulcers. He still has areas on both lower extremities currently ineffective even some areas that were not there previous. Fortunately there is no signs of active infection at this time. No fevers, chills, nausea, vomiting, or diarrhea. 01/04/21 upon evaluation today patient actually appears to be doing quite well in regard to his wounds. Fortunately there does not appear to be any signs of active infection which is great. There is some necrotic tissue that I think would be helpful to debride away pretty much on all wounds other than [name] heel which appears to be doing quite well. Objective Constitutional Well-nourished and well-hydrated in no acute distress. Vitals Time Taken: 3:04 PM, Height: 72 in, Weight: 223 lbs, BMI: 30.2, Temperature: 98.1 F, Pulse: 73 bpm, Respiratory Rate: 20 breaths/min, Blood Pressure: 141/65 mmHg. Respiratory normal breathing without difficulty. Psychiatric this patient is able to make decisions and demonstrates good insight into disease process. Alert and Oriented x 3.  pleasant and cooperative. General Notes: Upon inspection patient's wounds again did require some sharp debridement pretty much at all locations except for his right heel. All he tolerated without complication and postdebridement appear to be doing significantly better I think this will help [name] Santyl work even better with [name] [employer]. He seems to have excellent blood flow bilaterally in my opinion. Integumentary (Hair, Skin) Wound #1 status is Open. Original cause of wound was Pressure Injury. [name] date acquired was: 06/08/2020. [name] wound has been in treatment 15 weeks. [name] wound is located on [name] Right,Lateral Calcaneus. [name] wound measures 0.7cm length x 0.7cm width x 0.1cm depth; 0.385cm^2 area and 0.038cm^3 volume. There is Fat Layer (Subcutaneous Tissue) exposed. There is no tunneling or undermining noted. There is a medium amount of serosanguineous drainage noted. [name] wound margin is distinct with [name] outline attached to [name] wound base. There is large (67-100%) red granulation within [name] wound bed. There is a small (1-33%) amount of necrotic tissue within [name] wound bed including Adherent Slough. Wound #2 status is Open. Original cause of wound was Pressure Injury. [name] date acquired was: 10/17/2020. [name] wound has been in treatment 11 weeks. [name] wound is located on [name] Right,Medial Calcaneus. [name] wound measures 2cm length x 4cm width x 0.1cm depth; 6.283cm^2 area and 0.628cm^3 volume. There is Fat Layer (Subcutaneous Tissue) exposed. There is no tunneling or undermining noted. There is a medium amount of serous drainage noted. [name] wound margin is distinct with [name] outline attached to [name] wound base. There is large (67-100%) red, pink granulation within [name] wound bed. There is no necrotic tissue within [name] wound bed. Wound #3 status is Open. Original cause of wound was Gradually Appeared. [name] date acquired was: 10/12/2020. [name] wound has been in treatment 7 weeks. [name] wound is located on [name]  Left,Lateral Ankle. [name] wound measures 1.7cm length x 1.1cm width x 0.2cm depth; 1.469cm^2 area and 0.294cm^3 volume. There is Fat Layer (Subcutaneous Tissue) exposed. There is no tunneling or undermining noted. There is a medium amount of serosanguineous drainage noted. [name] wound margin is distinct with [name] outline attached to [name] wound base. There is large (67-100%) red granulation within [name] wound bed. There is a small (1-33%) amount of necrotic tissue within [name] wound bed including Adherent Slough. Wound #4 status is Open. Original cause of wound was Other Lesion. [name] date acquired was: 11/16/2020. [name] wound has been in treatment 7 weeks. [name] wound is located on [name] Left,Medial Lower Leg. [name] wound measures 1.4cm length x  1.2cm width x 0.3cm depth; 1.319cm^2 area and 0.396cm^3 volume. There is Fat Layer (Subcutaneous Tissue) exposed. There is no tunneling or undermining noted. There is a medium amount of serosanguineous drainage noted. [name] wound margin is well defined and not attached to [name] wound base. There is medium (34-66%) red, pink granulation within [name] wound bed. There is a medium (34-66%) amount of necrotic tissue within [name] wound bed including Adherent Slough. Wound #6 status is Open. Original cause of wound was Gradually Appeared. [name] date acquired was: 11/22/2020. [name] wound has been in treatment 2 weeks. [name] wound is located on [name] Right,Lateral Ankle. [name] wound measures 1.2cm length x 0.7cm width x 0.1cm depth; 0.66cm^2 area and 0.066cm^3 volume. There is no tunneling or undermining noted. There is a medium amount of serosanguineous drainage noted. [name] wound margin is distinct with [name] outline attached to [name] wound base. There is a large (67-100%) amount of necrotic tissue within [name] wound bed including Eschar and Adherent Slough. 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 There was a Excisional Skin/Subcutaneous Tissue Debridement with a total area of 1 sq cm performed by [name], PA. With [name] following instrument(s): Curette to remove Viable and Non-Viable tissue/material. Material removed includes Callus, Subcutaneous Tissue, Slough, Skin: Dermis, Skin: Epidermis, and Fibrin/Exudate after achieving pain control using Other (benzocaine 20%). A time out was conducted at 15:35, prior to [name] start of [name] procedure. A Minimum amount of bleeding was controlled with Pressure. [name] procedure was tolerated well with a pain level of 0 throughout and a pain level of 0 following [name] procedure. Post Debridement Measurements: 0.7cm length x 0.7cm width x 0.1cm depth; 0.038cm^3 volume. Post debridement Stage noted as Category/Stage III. Character of Wound/Ulcer Post Debridement is improved. Post procedure Diagnosis Wound #1: Same as Pre-Procedure Wound #2 Pre-procedure diagnosis of Wound #2 is a Pressure Ulcer located on [name] Right,Medial Calcaneus . There was a Chemical/Enzymatic/Mechanical debridement performed by Deon Pilling, RN.Marland Kitchen Agent used was [employer]. There was no bleeding. [name] procedure was tolerated well. Post Debridement Measurements: 2cm length x 4cm width x 0.1cm depth; 0.628cm^3 volume. Post debridement Stage noted as Category/Stage III. Character of Wound/Ulcer Post Debridement requires further debridement. Post procedure Diagnosis Wound #2: Same as Pre-Procedure Wound #3 Pre-procedure diagnosis of Wound #3 is a Venous Leg Ulcer located on [name]  Left,Lateral Ankle .Severity of Tissue Pre Debridement is: Fat layer exposed. There was a Excisional Skin/Subcutaneous Tissue Debridement with a total area of 1.87 sq cm performed by [name], PA. With [name] following instrument(s): Curette to remove Viable and Non-Viable tissue/material. Material removed includes Callus, Subcutaneous Tissue, Slough, Skin: Dermis, Skin: Epidermis, and Fibrin/Exudate after achieving pain control using Other (benzocaine 20%). A time out was conducted at 15:35, prior to [name] start of [name] procedure. A Minimum amount of bleeding was controlled with Pressure. [name] procedure was tolerated well with a pain level of 0 throughout and a pain level of 0 following [name] procedure. Post Debridement Measurements: 1.7cm length x 1.1cm width x 0.2cm depth; 0.294cm^3 volume. Character of Wound/Ulcer Post Debridement is  improved. Severity of Tissue Post Debridement is: Fat layer exposed. Post procedure Diagnosis Wound #3: Same as Pre-Procedure Wound #4 Pre-procedure diagnosis of Wound #4 is an Abscess located on [name] Left,Medial Lower Leg . There was a Excisional Skin/Subcutaneous Tissue Debridement with a total area of 1.68 sq cm performed by [name], PA. With [name] following instrument(s): Curette to remove Viable and Non-Viable tissue/material. Material removed includes Callus, Subcutaneous Tissue, Slough, Skin: Dermis, Skin: Epidermis, and Fibrin/Exudate after achieving pain control using Other (benzocaine 20%). A time out was conducted at 15:35, prior to [name] start of [name] procedure. A Minimum amount of bleeding was controlled with Pressure. [name] procedure was tolerated well with a pain level of 0 throughout and a pain level of 0 following [name] procedure. Post Debridement Measurements: 1.4cm length x 1.2cm width x 0.3cm depth; 0.396cm^3 volume. Character of Wound/Ulcer Post Debridement is improved. Post procedure Diagnosis Wound #4: Same as Pre-Procedure Wound #6 Pre-procedure  diagnosis of Wound #6 is an Arterial Insufficiency Ulcer located on [name] Right,Lateral Ankle .Severity of Tissue Pre Debridement is: Fat layer exposed. There was a Excisional Skin/Subcutaneous Tissue Debridement with a total area of 0.84 sq cm performed by [name], PA. With [name] following instrument(s): Curette to remove Viable and Non-Viable tissue/material. Material removed includes Eschar, Subcutaneous Tissue, Skin: Dermis, Skin: Epidermis, and Fibrin/Exudate after achieving pain control using Other (benzocaine 20%). A time out was conducted at 15:35, prior to [name] start of [name] procedure. A Minimum amount of bleeding was controlled with Pressure. [name] procedure was tolerated well with a pain level of 0 throughout and a pain level of 0 following [name] procedure. Post Debridement Measurements: 1.2cm length x 0.7cm width x 0.1cm depth; 0.066cm^3 volume. Character of Wound/Ulcer Post Debridement is improved. Severity of Tissue Post Debridement is: Fat layer exposed. Post procedure Diagnosis Wound #6: Same as Pre-Procedure Plan Follow-up Appointments: Return Appointment in 2 weeks. Margarita Grizzle Wednesday ***Extra time 60 minutes*** Other: - [employer]- supplies. Bathing/ Shower/ Hygiene: May shower and wash wound with soap and water. - with dressing changes. Edema Control - Lymphedema / SCD / Other: Elevate legs to [name] level of [name] heart or above for 30 minutes daily and/or when sitting, a frequency of: - throughout [name] day. Avoid standing for long periods of time. Moisturize legs daily. - both legs and feet with dressing changes. Off-Loading: Other: - bunny boots to both feet while resting in bed or chair. Ensure no pressure to wounds to aid in healing. Additional Orders / Instructions: Other: - May continue to walk and work with physical therapy. [name] following medication(s) was prescribed: Santyl topical 250 unit/gram ointment ointment topical Apply nickel thick daily to [name] wound bed and  then cover with a dressing as directed in clinic x 30 days starting 01/04/2021 WOUND #1: - Calcaneus Wound Laterality: Right, Lateral Cleanser: Soap and Water Every Other Day/30 Days Discharge Instructions: May shower and wash wound with dial antibacterial soap and water prior to dressing change. Prim Dressing: Hydrofera Blue Classic Foam, 2x2 in (Generic) Every Other Day/30 Days ary Discharge Instructions: Moisten with saline prior to applying over [name] santyl. Prim Dressing: Santyl Ointment Every Other Day/30 Days ary Discharge Instructions: Apply nickel thick amount directly to wound bed. Secondary Dressing: Woven Gauze Sponge, Non-Sterile 4x4 in (Generic) Every Other Day/30 Days Discharge Instructions: Apply over primary dressing as directed. Secured With: [name] [employer], 4.5x3.1 (in/yd) (Generic) Every Other Day/30 Days Discharge Instructions: Secure with Kerlix as directed. Secured With:  14M Medipore H Soft Cloth Surgical T ape, 4 x 10 (in/yd) (Generic) Every Other Day/30 Days Discharge Instructions: Secure with tape as directed. WOUND #2: - Calcaneus Wound Laterality: Right, Medial Cleanser: Soap and Water Every Other Day/30 Days Discharge Instructions: May shower and wash wound with dial antibacterial soap and water prior to dressing change. Prim Dressing: Hydrofera Blue Classic Foam, 2x2 in (Generic) Every Other Day/30 Days ary Discharge Instructions: Moisten with saline prior to applying over [name] santyl. Prim Dressing: Santyl Ointment Every Other Day/30 Days ary Discharge Instructions: Apply nickel thick amount directly to wound bed. Secondary Dressing: Woven Gauze Sponge, Non-Sterile 4x4 in (Generic) Every Other Day/30 Days Discharge Instructions: Apply over primary dressing as directed. Secured With: [name] [employer], 4.5x3.1 (in/yd) (Generic) Every Other Day/30 Days Discharge Instructions: Secure with Kerlix as directed. Secured With: 14M Medipore H Soft Cloth Surgical T  ape, 4 x 10 (in/yd) (Generic) Every Other Day/30 Days Discharge Instructions: Secure with tape as directed. WOUND #3: - Ankle Wound Laterality: Left, Lateral Cleanser: Soap and Water Every Other Day/30 Days Discharge Instructions: May shower and wash wound with dial antibacterial soap and water prior to dressing change. Prim Dressing: Hydrofera Blue Classic Foam, 2x2 in (Generic) Every Other Day/30 Days ary Discharge Instructions: Moisten with saline prior to applying over [name] santyl. Prim Dressing: Santyl Ointment Every Other Day/30 Days ary Discharge Instructions: Apply nickel thick amount directly to wound bed. Secondary Dressing: Woven Gauze Sponge, Non-Sterile 4x4 in (Generic) Every Other Day/30 Days Discharge Instructions: Apply over primary dressing as directed. Secured With: [name] [employer], 4.5x3.1 (in/yd) (Generic) Every Other Day/30 Days Discharge Instructions: Secure with Kerlix as directed. Secured With: 14M Medipore H Soft Cloth Surgical T ape, 4 x 10 (in/yd) (Generic) Every Other Day/30 Days Discharge Instructions: Secure with tape as directed. WOUND #4: - Lower Leg Wound Laterality: Left, Medial Cleanser: Soap and Water Every Other Day/30 Days Discharge Instructions: May shower and wash wound with dial antibacterial soap and water prior to dressing change. Prim Dressing: Hydrofera Blue Classic Foam, 2x2 in (Generic) Every Other Day/30 Days ary Discharge Instructions: Moisten with saline prior to applying over [name] santyl. Prim Dressing: Santyl Ointment Every Other Day/30 Days ary Discharge Instructions: Apply nickel thick amount directly to wound bed. Secondary Dressing: Woven Gauze Sponge, Non-Sterile 4x4 in (Generic) Every Other Day/30 Days Discharge Instructions: Apply over primary dressing as directed. Secured With: [name] [employer], 4.5x3.1 (in/yd) (Generic) Every Other Day/30 Days Discharge Instructions: Secure with Kerlix as directed. Secured With: 14M  Medipore H Soft Cloth Surgical T ape, 4 x 10 (in/yd) (Generic) Every Other Day/30 Days Discharge Instructions: Secure with tape as directed. WOUND #6: - Ankle Wound Laterality: Right, Lateral Cleanser: Soap and Water Every Other Day/30 Days Discharge Instructions: May shower and wash wound with dial antibacterial soap and water prior to dressing change. Prim Dressing: Hydrofera Blue Classic Foam, 2x2 in (Generic) Every Other Day/30 Days ary Discharge Instructions: Moisten with saline prior to applying over [name] santyl. Prim Dressing: Santyl Ointment Every Other Day/30 Days ary Discharge Instructions: Apply nickel thick amount directly to wound bed. Secondary Dressing: Woven Gauze Sponge, Non-Sterile 4x4 in (Generic) Every Other Day/30 Days Discharge Instructions: Apply over primary dressing as directed. Secured With: [name] [employer], 4.5x3.1 (in/yd) (Generic) Every Other Day/30 Days Discharge Instructions: Secure with Kerlix as directed. Secured With: 14M Medipore H Soft Cloth Surgical T ape, 4 x 10 (in/yd) (Generic) Every Other Day/30 Days Discharge Instructions: Secure with tape as directed. 1.  I would recommend currently that we going continue with [name] [facility] using [name] Santyl underneath I think this is doing a great job. 2. I am also can recommend that we have [name] patient continue with [name] ABD pads and roll gauze to secure in place. 3. I am also going to suggest that he continue to elevate his legs much as possible to help with edema control I think this is still doing quite well. We will see patient back for reevaluation in 2 weeks here in [name] clinic. If anything worsens or changes patient will contact our office for additional recommendations. Electronic Signature(s) Signed: 01/04/2021 4:35:13 PM By: [name] PA-C Previous Signature: 01/04/2021 3:51:45 PM Version By: [name] PA-C Entered By: [name] on 01/04/2021  16:35:13 -------------------------------------------------------------------------------- SuperBill Details Patient Name: Date of Service: [name] [name], [name] 01/04/2021 Medical Record Number: [id number] Patient Account Number: [id number] Date of Birth/Sex: Treating RN: 12/30/1926 (88 y.o. 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 N40.1 Benign prostatic hyperplasia with lower urinary tract symptoms N18.30 Chronic kidney disease, stage 3 unspecified I10 Essential (primary) hypertension Facility Procedures CPT4 Code: [id number] Description: [phone] - DEBRIDE W/O ANES NON SELECT Modifier: 59 Quantity: 1 CPT4 Code: [id number] Description: 62952 - DEB SUBQ TISSUE 20 SQ CM/< ICD-10 Diagnosis Description L97.822 Non-pressure chronic ulcer of other part of left lower leg with fat layer exposed L97.312 Non-pressure chronic ulcer of right ankle with fat layer exposed L89.613  Pressure ulcer of right heel, stage 3 Modifier: Quantity: 1 Physician Procedures : CPT4 Code Description Modifier [id number] - WC PHYS LEVEL 4 - EST PT 25 ICD-10 Diagnosis Description L89.613 Pressure ulcer of right heel, stage 3 I73.89 Other specified peripheral vascular diseases I87.2 Venous insufficiency (chronic) (peripheral)  L97.822 Non-pressure chronic ulcer of other part of left lower leg with fat layer exposed Quantity: 1 : [id number] 11042 - WC PHYS SUBQ TISS 20 SQ CM ICD-10 Diagnosis Description L97.822 Non-pressure chronic ulcer of  other part of left lower leg with fat layer exposed L97.312 Non-pressure chronic ulcer of right ankle with fat layer exposed L89.613 Pressure  ulcer of right heel, stage 3 Quantity: 1 Electronic Signature(s) Signed: 01/04/2021 4:36:29 PM By: [name] PA-C Previous Signature: 01/04/2021 3:52:16 PM Version By: [name] PA-C Entered By: [name] on 01/04/2021 16:36:29

## 2021-01-04 NOTE — Progress Notes (Signed)
Primary Physician/Referring:  [name], MD  Patient ID: [name] [name], male    DOB: 02/09/1926, 88 y.o.   MRN: [id number]  Chief Complaint  Patient presents with   Atrial Fibrillation   Hypertension   Hyperlipidemia   Follow-up    3 month   HPI:    [name] [name]  is a 88 y.o. Caucasian male  with vitamin B12 deficiency, mild dementia, hyperlipidemia, hypertension, CAD, chronic stable angina status post CABG x 3 and Maze and LAA amputation in 2014, paroxysmal atrial fibrillation, obstructive sleep apnea not currently on CPAP, moderate aortic stenosis with thoracic aortic aneurysm and dilation of ascending aorta and follows Dr. Lilly Cove, venous stasis edema. Patient underwent TAVR 11/10/2019, patient had postoperative atrial flutter and was started on amiodarone.  Patient also follows with wound care for chronic wound management and vascular surgery for PAD.  Patient's PCP manages his anxiety and anemia.  Patient is feeling well overall without specific complaints at today's office visit.  He continues to have bilateral leg edema which is chronic and stable.  He also continues to follow-up with vascular surgery for management of PAD and wound clinic.  Denies chest pain, dyspnea, syncope, near syncope.  Denies orthopnea, PND.  Past Medical History:  Diagnosis Date   AAA (abdominal aortic aneurysm)    Acute coronary syndrome (HCC) 07/02/7791   Acute systolic heart failure ([place]) 03/03/2012   Angina    Arthritis    Ascending aorta dilatation ([place]) 03/13/2012   Fusiform dilatation of the ascending thoracic aorta discovered during surgery, max diameter 4.2-4.3 cm   Atrial fibrillation (HCC) 02/29/2012   Recurrent paroxysmal, new-onset   BPH (benign prostatic hypertrophy)    CAD (coronary artery disease)    Cath April 2012 40% left main, 50% LAD, 40% OM, occluded RCA with L-R collaterals    CHF (congestive heart failure) (HCC)    Chronic venous insufficiency    Dyspnea    GERD  (gastroesophageal reflux disease)    Glaucoma    Gout    Headache    MIGRAINES in the past   History of kidney stones    Hyperlipidemia    Hypertension    MI (myocardial infarction) ([place])    Obesity (BMI 30-39.9)    Paroxysmal atrial fibrillation ([place]) 02/29/2012   Recurrent paroxysmal, new-onset    PMR (polymyalgia rheumatica) (HCC)    Polymyalgia rheumatica ([place]) 02/06/2019   Pre-diabetes    S/P CABG x 3 03/06/2012   LIMA to LAD, SVG to OM, SVG to RCA, EVH via right thigh   S/P Maze operation for atrial fibrillation 03/06/2012   Complete bilateral lesions set using bipolar radiofrequency and cryothermy ablation with clipping of LA appendage   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   s/p TAVR with a 26 mm Edwards via the left subclavian by Drs Buena Irish and Bartle   Severe aortic stenosis 09/29/2019   Sleep apnea    USES CPAP NIGHTLY   Past Surgical History:  Procedure Laterality Date   ABDOMINAL AORTOGRAM W/LOWER EXTREMITY N/A 10/26/2020   Procedure: ABDOMINAL AORTOGRAM W/LOWER EXTREMITY;  Surgeon: Broadus John, MD;  Location: [place] [facility];  Service: Cardiovascular;  Laterality: N/A;   CARDIAC CATHETERIZATION     CATARACT EXTRACTION, BILATERAL     with lens implants   CHOLECYSTECTOMY N/A 03/24/2012   Procedure: LAPAROSCOPIC CHOLECYSTECTOMY WITH INTRAOPERATIVE CHOLANGIOGRAM;  Surgeon: Zenovia Jarred, MD;  Location: [place];  Service: General;  Laterality: N/A;  CORONARY ARTERY BYPASS GRAFT N/A 03/06/2012   Procedure: CORONARY ARTERY BYPASS GRAFTING (CABG);  Surgeon: Rexene Alberts, MD;  Location: [place];  Service: Open Heart Surgery;  Laterality: N/A;   ENDOVEIN HARVEST OF GREATER SAPHENOUS VEIN Right 03/06/2012   Procedure: ENDOVEIN HARVEST OF GREATER SAPHENOUS VEIN;  Surgeon: Rexene Alberts, MD;  Location: [place];  Service: Open Heart Surgery;  Laterality: Right;   INTRAOPERATIVE TRANSESOPHAGEAL ECHOCARDIOGRAM N/A 03/06/2012   Procedure: INTRAOPERATIVE  TRANSESOPHAGEAL ECHOCARDIOGRAM;  Surgeon: Rexene Alberts, MD;  Location: [place];  Service: Open Heart Surgery;  Laterality: N/A;   IR KYPHO LUMBAR INC FX REDUCE BONE BX UNI/BIL CANNULATION INC/IMAGING  02/23/2020   LEFT HEART CATH Right 03/02/2012   Procedure: LEFT HEART CATH;  Surgeon: Sherren Mocha, MD;  Location: [facility];  Service: Cardiovascular;  Laterality: Right;   MAZE N/A 03/06/2012   Procedure: MAZE;  Surgeon: Rexene Alberts, MD;  Location: [place];  Service: Open Heart Surgery;  Laterality: N/A;   PERIPHERAL VASCULAR INTERVENTION Right 10/26/2020   Procedure: PERIPHERAL VASCULAR INTERVENTION;  Surgeon: Broadus John, MD;  Location: [place] [facility];  Service: Cardiovascular;  Laterality: Right;   PROSTATECTOMY     partial   RIGHT/LEFT HEART CATH AND CORONARY/GRAFT ANGIOGRAPHY N/A 09/29/2019   Procedure: RIGHT/LEFT HEART CATH AND CORONARY/GRAFT ANGIOGRAPHY;  Surgeon: Adrian Prows, MD;  Location: [place] [facility];  Service: Cardiovascular;  Laterality: N/A;   TEE WITHOUT CARDIOVERSION N/A 11/10/2019   Procedure: TRANSESOPHAGEAL ECHOCARDIOGRAM (TEE);  Surgeon: Burnell Blanks, MD;  Location: [place];  Service: Open Heart Surgery;  Laterality: N/A;   TRANSCATHETER AORTIC VALVE REPLACEMENT, TRANSFEMORAL  11/10/2019   Family History  Problem Relation Age of Onset   Dementia Mother    Stroke Brother     Social History   Tobacco Use   Smoking status: Former    Packs/day: 0.50    Years: 15.00    Pack years: 7.50    Types: Cigarettes    Quit date: 11/18/1962    Years since quitting: 58.1   Smokeless tobacco: Never  Substance Use Topics   Alcohol use: Yes    Alcohol/week: 2.0 standard drinks    Types: 2 Shots of liquor per week    Comment: daily   ROS  Review of Systems  Constitutional: Negative for malaise/fatigue and weight gain.  Cardiovascular:  Positive for chest pain (at rest) and leg swelling (chronic, stable). Negative for claudication, near-syncope,  orthopnea, palpitations, paroxysmal nocturnal dyspnea and syncope.  Respiratory:  Negative for shortness of breath.   Neurological:  Negative for dizziness.  Objective  Blood pressure (!) 119/49, pulse 68, temperature 98 F (36.7 C), resp. rate 16, height 6' (1.829 m), SpO2 96 %.  Vitals with BMI 01/05/2021 12/20/2020 11/22/2020  Height '6\' 0"'  '6\' 0"'  '6\' 1"'   Weight - 210 lbs 210 lbs  BMI - 41.63 84.53  Systolic 646 803 212  Diastolic 49 58 63  Pulse 68 50 62     Physical Exam Vitals reviewed.  Constitutional:      General: He is not in acute distress.    Appearance: He is well-developed.     Comments: Presented to today's office visit in wheelchair  Cardiovascular:     Rate and Rhythm: Normal rate and regular rhythm.     Pulses:          Carotid pulses are 2+ on the right side and 2+ on the left side.  Radial pulses are 2+ on the right side and 2+ on the left side.       Femoral pulses are 2+ on the right side and 2+ on the left side.      Popliteal pulses are 2+ on the right side and 2+ on the left side.       Dorsalis pedis pulses are 0 on the right side and 1+ on the left side.       Posterior tibial pulses are 0 on the right side and 0 on the left side.     Heart sounds: Murmur heard.  Early systolic murmur is present with a grade of 2/6 at the upper right sternal border. S1 is normal, S2 is muffled.    No gallop.     Comments: No JVD Pulmonary:     Effort: Pulmonary effort is normal.     Breath sounds: No rhonchi or rales.  Musculoskeletal:     Right lower leg: Edema (1-2+ pitting, stable) present.     Left lower leg: Edema (1-2+ pitting, stable) present.  Skin:    Comments: Unable to evaluate right heel wound as it is presently dressed per wound management.   Laboratory examination:   Recent Labs    10/25/20 0119 10/26/20 0327 10/27/20 0201  NA 137 137 137  K 4.2 4.2 3.8  CL 109 110 112*  CO2 21* 20* 19*  GLUCOSE 108* 106* 95  BUN '19 20 15  ' CREATININE 1.21  1.13 1.09  CALCIUM 8.2* 8.5* 8.2*  GFRNONAA 56* >60 >60   CrCl cannot be calculated (Patient's most recent lab result is older than the maximum 21 days allowed.).  CMP Latest Ref Rng & Units 10/27/2020 10/26/2020 10/25/2020  Glucose 70 - 99 mg/dL 95 106(H) 108(H)  BUN 8 - 23 mg/dL '15 20 19  ' Creatinine 0.61 - 1.24 mg/dL 1.09 1.13 1.21  Sodium 135 - 145 mmol/L 137 137 137  Potassium 3.5 - 5.1 mmol/L 3.8 4.2 4.2  Chloride 98 - 111 mmol/L 112(H) 110 109  CO2 22 - 32 mmol/L 19(L) 20(L) 21(L)  Calcium 8.9 - 10.3 mg/dL 8.2(L) 8.5(L) 8.2(L)  Total Protein 6.5 - 8.1 g/dL - - -  Total Bilirubin 0.3 - 1.2 mg/dL - - -  Alkaline Phos 38 - 126 U/L - - -  AST 15 - 41 U/L - - -  ALT 0 - 44 U/L - - -   CBC Latest Ref Rng & Units 10/27/2020 10/26/2020 10/24/2020  WBC 4.0 - 10.5 K/uL 6.1 4.9 7.2  Hemoglobin 13.0 - 17.0 g/dL 8.2(L) 8.3(L) 8.4(L)  Hematocrit 39.0 - 52.0 % 26.5(L) 26.1(L) 26.8(L)  Platelets 150 - 400 K/uL 118(L) 112(L) 111(L)   TSH No results for input(s): TSH in the last 8760 hours.  BNP    Component Value Date/Time   BNP 199.4 (H) 08/23/2020 0240    ProBNP    Component Value Date/Time   PROBNP 185 08/29/2018 1218   PROBNP 431.9 06/25/2012 1420     External labs:  09/15/2020: BNP 184.3 Platelet 96, hemoglobin 8.9, hematocrit 27.1 Glucose 115, BUN 22, creatinine 1.6, GFR 40.6, sodium 143, potassium 4.4  06/14/2020: Glucose 210, BUN 30, creatinine 2.1, GFR 29.7, sodium 134, potassium 4.9,  06/10/2020:  Glucose 5, BUN 40, creatinine 2.1, GFR 29.7, sodium 131, potassium 5.2, AST 20, ALT 27, alk phos 90  06/07/2020: Hemoglobin 9.6, hematocrit 29.9, MCV 95, platelet 168 Sed rate 56, CRP 197 ALT 23, AST 43, alk phos 105, 135,  potassium 4.4, creatinine 1.77, BUN 30 Will, GFR 36 A1c 6.0%  Cholesterol, total 115.000 m 01/19/2019 HDL 47 MG/DL 01/19/2019 LDL 51.000 mg 01/19/2019 Triglycerides 84.000 01/19/2019  A1C 5.600 % 01/19/2019; TSH 2.320 01/19/2019  Hemoglobin 10.900  g/ 02/06/2019; INR 1.900 09/11/2018 Platelets 182.000 09/11/2018  Creatinine, Serum 1.300 mg/ 02/06/2019 Potassium 4.100 09/11/2018 Magnesium N/D ALT (SGPT) 15.000 uni 01/19/2019  Allergies   Allergies  Allergen Reactions   Amoxicillin Other (See Comments)    Headache, debilitated   Bee Venom Anaphylaxis   Avelox [Moxifloxacin]     Other reaction(s): Unknown   Codeine Other (See Comments)   Duloxetine Hcl     Other reaction(s): felt weird   Levitra [Vardenafil]     Other reaction(s): Unknown   Morphine And Related Other (See Comments)    hypotension   Penicillin G Sodium     Other reaction(s): Severe headache, fatigue "debilitated"   Testosterone Other (See Comments)    unknown   Viagra [Sildenafil]     Other reaction(s): didn't like it    Medications Prior to Visit:   Outpatient Medications Prior to Visit  Medication Sig Dispense Refill   acetaminophen (TYLENOL) 500 MG tablet Take 2 tablets (1,000 mg total) by mouth 3 (three) times daily. For 3 to 5 days and then change to 1000 mg 3 times daily as needed for pain. (Patient taking differently: Take 500 mg by mouth as needed for mild pain.)     amiodarone (PACERONE) 200 MG tablet Take 1 tablet (200 mg total) by mouth daily. 90 tablet 3   aspirin EC 81 MG EC tablet Take 1 tablet (81 mg total) by mouth daily. Swallow whole. 30 tablet 11   atorvastatin (LIPITOR) 40 MG tablet Take 1 tablet (40 mg total) by mouth daily. 30 tablet 2   cholecalciferol (VITAMIN D3) 25 MCG (1000 UNIT) tablet Take 1,000 Units by mouth daily.      docusate sodium (COLACE) 100 MG capsule Take 1 capsule (100 mg total) by mouth 2 (two) times daily.     EPINEPHrine 0.3 mg/0.3 mL IJ SOAJ injection Inject 0.3 mLs (0.3 mg total) into the muscle as needed for anaphylaxis. (Patient taking differently: Inject 0.3 mg into the muscle once as needed for anaphylaxis.) 2 each 2   ezetimibe (ZETIA) 10 MG tablet Take 10 mg by mouth daily.     finasteride (PROSCAR) 5 MG tablet  Take 5 mg by mouth daily.     fish oil-omega-3 fatty acids 1000 MG capsule Take 1 g by mouth daily.      fluticasone (FLONASE) 50 MCG/ACT nasal spray Place 1 spray into both nostrils daily as needed for allergies.     gabapentin (NEURONTIN) 100 MG capsule Take 100 mg by mouth 4 (four) times daily.     galantamine (RAZADYNE ER) 8 MG 24 hr capsule Take 8 mg by mouth Daily.     HONEY BEE VENOM IJ Inject 2 each as directed See admin instructions. Every eight weeks     hydrALAZINE (APRESOLINE) 25 MG tablet Take 25 mg by mouth 2 (two) times daily.     isosorbide mononitrate (IMDUR) 60 MG 24 hr tablet TAKE 1 TABLET(60 MG) BY MOUTH DAILY AFTER SUPPER (Patient taking differently: Take 60 mg by mouth daily.) 90 tablet 1   leflunomide (ARAVA) 10 MG tablet Take 10 mg by mouth daily.     magnesium gluconate (MAGONATE) 500 MG tablet Take 500 mg by mouth daily.     memantine (NAMENDA) 10 MG  tablet Take 10 mg by mouth 2 (two) times daily.     metoprolol succinate (TOPROL-XL) 25 MG 24 hr tablet Take 1 tablet (25 mg total) by mouth daily as needed (for high heart rate, consistently greater than 110/min.).     nitroGLYCERIN (NITROSTAT) 0.4 MG SL tablet Place 1 tablet (0.4 mg total) under the tongue every 5 (five) minutes x 3 doses as needed for chest pain. 30 tablet 3   pantoprazole (PROTONIX) 40 MG tablet Take 40 mg by mouth every other day.   4   polyethylene glycol (MIRALAX / GLYCOLAX) 17 g packet Take 17 g by mouth 2 (two) times daily.     potassium chloride (MICRO-K) 10 MEQ CR capsule TAKE ONE CAPSULE BY MOUTH DAILY WITH LASIX (Patient taking differently: Take 10 mEq by mouth daily.) 90 capsule 1   predniSONE (DELTASONE) 5 MG tablet Take 5 mg by mouth daily.     PRESCRIPTION MEDICATION Inhale 1 each into the lungs at bedtime. Cpap     Psyllium Husk POWD Take 1 Scoop by mouth daily.      Rivaroxaban (XARELTO) 15 MG TABS tablet Take 15 mg by mouth daily with supper.     torsemide (DEMADEX) 20 MG tablet Take 2  tablets (40 mg total) by mouth daily. 180 tablet 1   triamcinolone cream (KENALOG) 0.1 % Apply 1 application topically daily as needed (skin irritation).     vitamin B-12 (CYANOCOBALAMIN) 1000 MCG tablet Take 1,000 mcg by mouth daily.      cyclobenzaprine (FLEXERIL) 10 MG tablet Take 10 mg by mouth as needed for muscle spasms.     No facility-administered medications prior to visit.   Final Medications at End of Visit    Current Meds  Medication Sig   acetaminophen (TYLENOL) 500 MG tablet Take 2 tablets (1,000 mg total) by mouth 3 (three) times daily. For 3 to 5 days and then change to 1000 mg 3 times daily as needed for pain. (Patient taking differently: Take 500 mg by mouth as needed for mild pain.)   amiodarone (PACERONE) 200 MG tablet Take 1 tablet (200 mg total) by mouth daily.   aspirin EC 81 MG EC tablet Take 1 tablet (81 mg total) by mouth daily. Swallow whole.   atorvastatin (LIPITOR) 40 MG tablet Take 1 tablet (40 mg total) by mouth daily.   cholecalciferol (VITAMIN D3) 25 MCG (1000 UNIT) tablet Take 1,000 Units by mouth daily.    docusate sodium (COLACE) 100 MG capsule Take 1 capsule (100 mg total) by mouth 2 (two) times daily.   EPINEPHrine 0.3 mg/0.3 mL IJ SOAJ injection Inject 0.3 mLs (0.3 mg total) into the muscle as needed for anaphylaxis. (Patient taking differently: Inject 0.3 mg into the muscle once as needed for anaphylaxis.)   ezetimibe (ZETIA) 10 MG tablet Take 10 mg by mouth daily.   finasteride (PROSCAR) 5 MG tablet Take 5 mg by mouth daily.   fish oil-omega-3 fatty acids 1000 MG capsule Take 1 g by mouth daily.    fluticasone (FLONASE) 50 MCG/ACT nasal spray Place 1 spray into both nostrils daily as needed for allergies.   gabapentin (NEURONTIN) 100 MG capsule Take 100 mg by mouth 4 (four) times daily.   galantamine (RAZADYNE ER) 8 MG 24 hr capsule Take 8 mg by mouth Daily.   HONEY BEE VENOM IJ Inject 2 each as directed See admin instructions. Every eight weeks    hydrALAZINE (APRESOLINE) 25 MG tablet Take 25 mg by mouth  2 (two) times daily.   isosorbide mononitrate (IMDUR) 60 MG 24 hr tablet TAKE 1 TABLET(60 MG) BY MOUTH DAILY AFTER SUPPER (Patient taking differently: Take 60 mg by mouth daily.)   leflunomide (ARAVA) 10 MG tablet Take 10 mg by mouth daily.   magnesium gluconate (MAGONATE) 500 MG tablet Take 500 mg by mouth daily.   memantine (NAMENDA) 10 MG tablet Take 10 mg by mouth 2 (two) times daily.   metoprolol succinate (TOPROL-XL) 25 MG 24 hr tablet Take 1 tablet (25 mg total) by mouth daily as needed (for high heart rate, consistently greater than 110/min.).   nitroGLYCERIN (NITROSTAT) 0.4 MG SL tablet Place 1 tablet (0.4 mg total) under the tongue every 5 (five) minutes x 3 doses as needed for chest pain.   pantoprazole (PROTONIX) 40 MG tablet Take 40 mg by mouth every other day.    polyethylene glycol (MIRALAX / GLYCOLAX) 17 g packet Take 17 g by mouth 2 (two) times daily.   potassium chloride (MICRO-K) 10 MEQ CR capsule TAKE ONE CAPSULE BY MOUTH DAILY WITH LASIX (Patient taking differently: Take 10 mEq by mouth daily.)   predniSONE (DELTASONE) 5 MG tablet Take 5 mg by mouth daily.   PRESCRIPTION MEDICATION Inhale 1 each into the lungs at bedtime. Cpap   Psyllium Husk POWD Take 1 Scoop by mouth daily.    Rivaroxaban (XARELTO) 15 MG TABS tablet Take 15 mg by mouth daily with supper.   torsemide (DEMADEX) 20 MG tablet Take 2 tablets (40 mg total) by mouth daily.   triamcinolone cream (KENALOG) 0.1 % Apply 1 application topically daily as needed (skin irritation).   vitamin B-12 (CYANOCOBALAMIN) 1000 MCG tablet Take 1,000 mcg by mouth daily.     Radiology:   CT angiogram of the chest 04/29/2017: Stable ascending thoracic aortic aneurysm measuring up to 4.3 cm. Recommend annual imaging followup by CTA or MRA. This recommendation follows 2010 ACCF/AHA/AATS/ACR/ASA/SCA/SCAI/SIR/STS/SVM Guidelines for the Diagnosis and Management of Patients with  Thoracic Aortic Disease. Circulation. 2010; 121: [id number]   Atherosclerotic changes thoracic aorta with irregular plaque/small ulceration thoracic-abdominal aortic junction similar to prior exam.   Atherosclerotic changes origin of great vessels most notable involving the left subclavian artery.   Atherosclerotic changes with narrowing superior mesenteric artery and right renal artery.   Superior aspect of abdominal aortic aneurysm incompletely assessed on the present exam.   Post CABG.  Prominent coronary artery calcification.   Aortic Atherosclerosis (ICD10-I70.0).  Coronary CTA 10/13/2019:  1. Tri- leaflet AV with annular area 540 mm2 upper limit for 26 mm Sapien 3 valve Despite large sinuses commissural calcium may suggest smaller valve better than 29 mm valve   2. Optimum angiographic angle for deployment LAO 16 Caudal 23 degrees   3.  Moderate aortic root dilatation 4.4 cm   4. Coronary arteries sufficient height above annulus for deployment with patent SVG to RCA/OM1 and patent LIMA to LAD   5.  LAA has been clipped with no contrast flow into the appendage  CTA Chest/abd/pelvis 10/13/2019:  Vascular/Lymphatic: Aortic atherosclerosis, with vascular findings and measurements pertinent to potential TAVR procedure, as detailed below. In addition, there is multifocal aneurysmal dilatation and short segment dissections of both the abdominal aorta and the common iliac arteries bilaterally. Multifocal fusiform aneurysmal dilatation of the infrarenal abdominal aorta measuring up to 3.8 x 3.1 cm proximally and 4.1 x 3.0 cm more distally, with multiple short segment dissection flaps within these regions. Aneurysmal dilatation of the common iliac arteries measuring 1.9  cm in diameter on the right and 1.6 cm in diameter on the left. No lymphadenopathy noted in the abdomen or pelvis.  IMPRESSION: 1. Vascular findings and measurements pertinent to potential TAVR procedure,  as detailed above. Please note the presence of multiple aneurysms and short-segment dissections involving the infrarenal abdominal aorta as well as the common iliac arteries bilaterally. 2. Severe thickening calcification of the aortic valve, compatible with the reported clinical history of severe aortic stenosis. 3. Aortic atherosclerosis, in addition to left main and 3 vessel coronary artery disease. Status post median sternotomy for CABG including LIMA to the LAD. 4. Colonic diverticulosis without evidence of acute diverticulitis at this time. 5. Additional incidental findings, as above.  Cardiac Studies:   Nuclear stress test   12-13-17: 1. Lexiscan stress test with low level exercise was performed. Exercise capacity was not assessed. Stress symptoms included dyspnea. Peak effect blood pressure was 114/66 mmHg. The resting and stress electrocardiogram demonstrated normal sinus rhythm, RBBB, no resting arrhythmias and normal rest repolarization. Stress EKG is non diagnostic for ischemia as it is a pharmacologic stress. 2. The overall quality of the study is good. Left ventricular cavity is noted to be mildly enlarged on the rest and stress studies. Gated SPECT imaging demonstrated mild hypokinesis of the basal inferolateral and mid inferolateral myocardial wall(s). The left ventricular ejection fraction was calculated or visually estimated to be 69%. Medium sized, medium intensity, fixed perfusion defect in mid to basal inferolateral myocardium suggests old LCx territory infarct with no periinfarct ischemia. 3. Low risk study.   ABI 01/30/2019: Right: Resting right ankle-brachial index indicates moderate right lower extremity arterial disease. The right toe-brachial index is abnormal.  Left: Resting left ankle-brachial index indicates mild left lower extremity arterial disease. The left toe-brachial index is abnormal.   Right and left heart catheterization 09/29/2019: Right heart  catheterization RA 8/8, mean 6 mmHg.  RV 34/0, EDP 8 mmHg.;  PA 31/11, mean 18 mmHg.  PA saturation 62%.;  PW 13/25, mean 11 mmHg.  Aortic saturation 100%. Cardiac output 5.57, cardiac index 2.38, normal.  Normal SVR and PVR. LV: 1/4, EDP 14 mmHg.  Peak to peak aortic valve gradient was 41 with a mean gradient of 33.3 with a valve area of 1.09 cm.   Left heart catheterization Left main is calcific 50% to 60% stenosis distally and involves ostium of the large circumflex with a ostial 80% stenosis.  Gives origin to large OM1, OM 2. SVG to OM1 is widely patent and retrogradely fills the entire circumflex.  There is a 70 to 75% stenosis proximal to the graft insertion. LAD: Gives origin to large D1 and D2, mild diffuse disease, subtotally occluded to occluded in the midsegment. LIMA to LAD: Widely patent. RCA: Dominant and occluded in the proximal segment. SVG to RCA: Widely patent.   Impression: Moderate to severe aortic stenosis, symptomatic.  Patient will benefit from aortic valve replacement and consideration for TAVR.  110 mL contrast utilized, fluoroscopy time 42.8 minutes.  TAVR 11/10/2019:  TRANSCATHETER AORTIC VALVE REPLACEMENT, LEFT SUBCLAVIAN USING EDWARDS SAPIEN 3 ULTRA 26 MM THV AORTIC VALVE.  Cardiac Monitor 11/11/2019 - 12/08/2019:  Sinus rhythm with episodes of atrial flutter vs atrial tachycardia Premature atrial contractions No evidence of high grade AV block or pauses.   Echocardiogram 12/27/2020:  1. Left ventricular ejection fraction, by estimation, is 60 to 65%. The left ventricle has normal function. The left ventricle has no regional wall motion abnormalities. Left ventricular diastolic parameters are consistent  with Grade II diastolic  dysfunction (pseudonormalization). Elevated left ventricular end-diastolic pressure.  2. Right ventricular systolic function is normal. The right ventricular size is normal. Tricuspid regurgitation signal is inadequate for assessing PA  pressure.  3. Left atrial size was mildly dilated.  4. Right atrial size was mildly dilated.  5. The mitral valve is normal in structure. Trivial mitral valve regurgitation. No evidence of mitral stenosis.  6. Trivial intravalvular regurgitation. The aortic valve has been repaired/replaced. Aortic valve regurgitation is trivial. No aortic stenosis is present. Aortic valve mean gradient measures 9.5 mmHg. Aortic valve Vmax measures 2.05 m/s.  7. Aortic dilatation noted. There is moderate dilatation of the aortic root, measuring 44 mm. There is moderate dilatation of the ascending aorta, measuring 46 mm.  8. The inferior vena cava is dilated in size with >50% respiratory variability, suggesting right atrial pressure of 8 mmHg.  EKG  10/06/2020: Junctional escape rhythm at a rate of 52 bpm.  09/07/2020: Atrial fibrillation with controlled ventricular response at a rate of 60 bpm.  Left axis.  Right bundle branch block.  Bifascicular block.  Nonspecific T wave abnormality.  06/15/2020: Sinus rhythm at a rate of 81 bpm.  Left axis.  Right bundle branch block. Bifascicular block.   12/16/2019: Sinus bradycardia at a rate of 58 bpm.  Left axis, left anterior fascicular block.  Right bundle branch block.  Bifascicular block.  Compared to EKG 11/13/2019, previous nonspecific T wave abnormality of inferior leads not prominent.  11/13/2019 atrial fibrillation with controlled ventricular response at the rate of 76 bpm, left axis deviation, left anterior fascicular block.  Right bundle branch block.  Nonspecific T wave flattening inferior leads.  Normal eQT interval.   Assessment     ICD-10-CM   1. Chronic diastolic CHF (congestive heart failure) (HCC)  I50.32     2. Paroxysmal atrial fibrillation (HCC)  I48.0     3. Essential hypertension  I10     4. S/P TAVR (transcatheter aortic valve replacement)  Z95.2     5. PAD (peripheral artery disease) (HCC)  I73.9       No orders of the defined types were  placed in this encounter.     Medications Discontinued During This Encounter  Medication Reason   cyclobenzaprine (FLEXERIL) 10 MG tablet      This patients CHA2DS2-VASc Score 5 (CHF, HTN, MI, Age) and yearly risk of stroke 7.2%.   NYHA Class II   Recommendations:   [name] [name]  is a  88 y.o. Caucasian male  with vitamin B12 deficiency, mild dementia, hyperlipidemia, hypertension, CAD, chronic stable angina status post CABG x 3 and Maze and LAA amputation in 2014, paroxysmal atrial fibrillation, obstructive sleep apnea not currently on CPAP, moderate aortic stenosis with thoracic aortic aneurysm and dilation of ascending aorta and follows Dr. Lilly Cove, venous stasis edema. Patient underwent TAVR 11/10/2019, patient had postoperative atrial flutter and was started on amiodarone.  Patient also follows with wound care for chronic wound management and vascular surgery for PAD.  Patient's PCP manages his anxiety and anemia.  Patient presents for 72-monthfollow-up.  He is feeling well without specific complaints today.  Patient's bilateral leg edema is stable, no evidence of acute decompensated heart failure at this time.  Will defer further management of PAD to vascular surgery as he has appointment next month.  Reviewed and discussed recent echocardiogram, which is relatively stable compared to previous.  Patient will need SBE prophylaxis, in view of patient's  allergy to amoxicillin, recommend azithromycin.  Follow-up in 6 months, sooner if needed.   Alethia Berthold, PA-C 01/05/2021, 2:34 PM Office: [phone]

## 2021-01-05 ENCOUNTER — Ambulatory Visit: Payer: Medicare Other | Admitting: Student

## 2021-01-05 ENCOUNTER — Encounter: Payer: Self-pay | Admitting: Student

## 2021-01-05 VITALS — BP 119/49 | HR 68 | Temp 98.0°F | Resp 16 | Ht 72.0 in

## 2021-01-05 DIAGNOSIS — I480 Paroxysmal atrial fibrillation: Secondary | ICD-10-CM

## 2021-01-05 DIAGNOSIS — I5032 Chronic diastolic (congestive) heart failure: Secondary | ICD-10-CM | POA: Diagnosis not present

## 2021-01-05 DIAGNOSIS — I739 Peripheral vascular disease, unspecified: Secondary | ICD-10-CM

## 2021-01-05 DIAGNOSIS — Z952 Presence of prosthetic heart valve: Secondary | ICD-10-CM | POA: Diagnosis not present

## 2021-01-05 DIAGNOSIS — I10 Essential (primary) hypertension: Secondary | ICD-10-CM | POA: Diagnosis not present

## 2021-01-10 NOTE — Progress Notes (Signed)
CHANNING, YEAGER I. ([id number]) Visit Report for 01/04/2021 Arrival Information Details Patient Name: Date of Service: [name] [name] ID I. 01/04/2021 2:45 PM Medical Record Number: [id number] Patient Account Number: [id number] Date of Birth/Sex: Treating RN: [birth year] (88 y.o. [name] [name] Primary Care Anjanette Gilkey: [name] Other Clinician: Referring Zayed Griffie: Treating Beronica Lansdale/Extender: Eli Hose in Treatment: 15 Visit Information History Since Last Visit Added or deleted any medications: No Patient Arrived: Wheel Chair Any new allergies or adverse reactions: No Arrival Time: 15:04 Had a fall or experienced change in No Accompanied By: daughtere activities of daily living that may affect Transfer Assistance: Manual risk of falls: Patient Identification Verified: Yes Signs or symptoms of abuse/neglect since last visito No Secondary Verification Process Completed: Yes Hospitalized since last visit: No Patient Requires Transmission-Based Precautions: No Implantable device outside of the clinic excluding No Patient Has Alerts: Yes cellular tissue based products placed in the center Patient Alerts: Patient on Blood Thinner since last visit: Has Dressing in Place as Prescribed: Yes Pain Present Now: Yes Electronic Signature(s) Signed: 01/10/2021 3:01:20 PM By: Sandre Kitty Entered By: Sandre Kitty on 01/04/2021 15:04:28 -------------------------------------------------------------------------------- Encounter Discharge Information Details Patient Name: Date of Service: [name] [name], [name] 01/04/2021 2:45 PM Medical Record Number: [id number] Patient Account Number: [id number] Date of Birth/Sex: Treating RN: 1927/01/01 (88 y.o. [name] [name] Primary Care Grabiela Wohlford: [name] Other Clinician: Referring Lesbia Ottaway: Treating Kaelin Holford/Extender: Eli Hose in Treatment: 15 Encounter Discharge Information Items Post  Procedure Vitals Discharge Condition: Stable Temperature (F): 98.1 Ambulatory Status: Wheelchair Pulse (bpm): 73 Discharge Destination: Home Respiratory Rate (breaths/min): 20 Transportation: Private Auto Blood Pressure (mmHg): 141/65 Accompanied By: wife Schedule Follow-up Appointment: Yes Clinical Summary of Care: Electronic Signature(s) Signed: 01/04/2021 5:44:25 PM By: Deon Pilling RN, BSN Entered By: Deon Pilling on 01/04/2021 15:49:25 -------------------------------------------------------------------------------- Lower Extremity Assessment Details Patient Name: Date of Service: [name] [name], [name] 01/04/2021 2:45 PM Medical Record Number: [id number] Patient Account Number: [id number] Date of Birth/Sex: Treating RN: [birth year] (88 y.o. [name] [name] Primary Care Salinda Snedeker: [name] Other Clinician: Referring Laquilla Dault: Treating Sherlene Rickel/Extender: [name] in Treatment: 15 Edema Assessment Assessed: Shirlyn Goltz: Yes] Patrice Paradise: Yes] Edema: [Left: Yes] [Right: Yes] Calf Left: Right: Point of Measurement: 34 cm From Medial Instep 40.5 cm 40 cm Ankle Left: Right: Point of Measurement: 11 cm From Medial Instep 24 cm 28.5 cm Vascular Assessment Pulses: Dorsalis Pedis Palpable: [Left:Yes] [Right:Yes] Electronic Signature(s) Signed: 01/04/2021 5:44:25 PM By: Deon Pilling RN, BSN Entered By: Deon Pilling on 01/04/2021 15:07:03 -------------------------------------------------------------------------------- Multi-Disciplinary Care Plan Details Patient Name: Date of Service: [name] [name], [name] 01/04/2021 2:45 PM Medical Record Number: [id number] Patient Account Number: [id number] Date of Birth/Sex: Treating RN: Aug 10, 1926 (88 y.o. [name] [name] Primary Care Keerthana Vanrossum: [name] Other Clinician: Referring Donyel Castagnola: Treating Laquia Rosano/Extender: Eli Hose in Treatment: 15 Active Inactive Tissue Oxygenation Nursing  Diagnoses: Potential alteration in peripheral tissue perfusion (select prior to confirmation of diagnosis) Goals: Non-invasive arterial studies are completed as ordered Date Initiated: 09/21/2020 Date Inactivated: 10/19/2020 Target Resolution Date: 09/21/2020 Goal Status: Met Revascularization procedures completed as ordered Date Initiated: 09/21/2020 Target Resolution Date: 01/27/2021 Goal Status: Active Interventions: Assess patient understanding of disease process and management upon diagnosis and as needed Provide education on tissue oxygenation and ischemia Treatment Activities: Ankle Brachial Index (ABI) : 09/21/2020 Non-invasive vascular studies : 09/21/2020  T ordered outside of clinic : 09/21/2020 est Notes: 10/19/20: Had vascular testing, awaiting appointment to see vascular MD Wound/Skin Impairment Nursing Diagnoses: Knowledge deficit related to ulceration/compromised skin integrity Goals: Patient/caregiver will verbalize understanding of skin care regimen Date Initiated: 09/21/2020 Target Resolution Date: 01/27/2021 Goal Status: Active Interventions: Assess patient/caregiver ability to obtain necessary supplies Assess patient/caregiver ability to perform ulcer/skin care regimen upon admission and as needed Provide education on ulcer and skin care Treatment Activities: Skin care regimen initiated : 09/21/2020 Topical wound management initiated : 09/21/2020 Notes: Electronic Signature(s) Signed: 01/04/2021 5:44:25 PM By: Deon Pilling RN, BSN Entered By: Deon Pilling on 01/04/2021 15:17:02 -------------------------------------------------------------------------------- Pain Assessment Details Patient Name: Date of Service: [name] [name], [name] 01/04/2021 2:45 PM Medical Record Number: [id number] Patient Account Number: [id number] Date of Birth/Sex: Treating RN: Oct 25, 1926 (88 y.o. [name] [name] Primary Care Jeovanni Heuring: [name] Other Clinician: Referring  Raylyn Carton: Treating Kerim Statzer/Extender: Eli Hose in Treatment: 15 Active Problems Location of Pain Severity and Description of Pain Patient Has Paino Yes Site Locations Rate the pain. Current Pain Level: 5 Pain Management and Medication Current Pain Management: Electronic Signature(s) Signed: 01/04/2021 5:43:10 PM By: Baruch Gouty RN, BSN Signed: 01/10/2021 3:01:20 PM By: Sandre Kitty Entered By: Sandre Kitty on 01/04/2021 15:05:05 -------------------------------------------------------------------------------- Patient/Caregiver Education Details Patient Name: Date of Service: [name] [name] ID I. 1/4/2023andnbsp2:45 PM Medical Record Number: [id number] Patient Account Number: [id number] Date of Birth/Gender: Treating RN: 26-Mar-1926 (88 y.o. [name] [name] Primary Care Physician: [name] Other Clinician: Referring Physician: Treating Physician/Extender: Eli Hose in Treatment: 15 Education Assessment Education Provided To: Patient and Caregiver wife Education Topics Provided Wound/Skin Impairment: Handouts: Skin Care Do's and Dont's Methods: Explain/Verbal Responses: Reinforcements needed Electronic Signature(s) Signed: 01/04/2021 5:44:25 PM By: Deon Pilling RN, BSN Entered By: Deon Pilling on 01/04/2021 15:17:41 -------------------------------------------------------------------------------- Wound Assessment Details Patient Name: Date of Service: [name] [name], [name] 01/04/2021 2:45 PM Medical Record Number: [id number] Patient Account Number: [id number] Date of Birth/Sex: Treating RN: 12/01/1926 (88 y.o. [name] [name] Primary Care [name] [name]: [name] Other Clinician: Referring Schelly Chuba: Treating Radek Carnero/Extender: [name] in Treatment: 15 Wound Status Wound Number: 1 Primary Pressure Ulcer Etiology: Wound Location: Right, Lateral Calcaneus Wound Open Wounding  Event: Pressure Injury Status: Date Acquired: 06/08/2020 Comorbid Cataracts, Sleep Apnea, Arrhythmia, Congestive Heart Failure, Weeks Of Treatment: 15 History: Coronary Artery Disease, Hypertension, Myocardial Infarction, Clustered Wound: No End Stage Renal Disease, Gout, Dementia, Neuropathy Photos Wound Measurements Length: (cm) 0.7 Width: (cm) 0.7 Depth: (cm) 0.1 Area: (cm) 0.385 Volume: (cm) 0.038 % Reduction in Area: 22.2% % Reduction in Volume: 22.4% Epithelialization: Medium (34-66%) Tunneling: No Undermining: No Wound Description Classification: Category/Stage III Wound Margin: Distinct, outline attached Exudate Amount: Medium Exudate Type: Serosanguineous Exudate Color: red, brown Foul Odor After Cleansing: No Slough/Fibrino Yes Wound Bed Granulation Amount: Large (67-100%) Exposed Structure Granulation Quality: Red Fascia Exposed: No Necrotic Amount: Small (1-33%) Fat Layer (Subcutaneous Tissue) Exposed: Yes Necrotic Quality: Adherent Slough Tendon Exposed: No Muscle Exposed: No Joint Exposed: No Bone Exposed: No Treatment Notes Wound #1 (Calcaneus) Wound Laterality: Right, Lateral Cleanser Soap and Water Discharge Instruction: May shower and wash wound with dial antibacterial soap and water prior to dressing change. Peri-Wound Care Topical Primary Dressing Hydrofera Blue Classic Foam, 2x2 in Discharge Instruction: Moisten with saline prior to applying over the santyl. Santyl Ointment Discharge Instruction: Apply nickel thick  amount directly to wound bed. Secondary Dressing Woven Gauze Sponge, Non-Sterile 4x4 in Discharge Instruction: Apply over primary dressing as directed. Secured With [employer], 4.5x3.1 (in/yd) Discharge Instruction: Secure with Kerlix as directed. 76M Medipore H Soft Cloth Surgical T ape, 4 x 10 (in/yd) Discharge Instruction: Secure with tape as directed. Compression Wrap Compression Stockings Add-Ons Electronic  Signature(s) Signed: 01/04/2021 5:43:10 PM By: Baruch Gouty RN, BSN Signed: 01/04/2021 5:44:25 PM By: Deon Pilling RN, BSN Entered By: Deon Pilling on 01/04/2021 15:07:40 -------------------------------------------------------------------------------- Wound Assessment Details Patient Name: Date of Service: [name] [name], [name] 01/04/2021 2:45 PM Medical Record Number: [id number] Patient Account Number: [id number] Date of Birth/Sex: Treating RN: 08/01/1926 (88 y.o. [name] [name] Primary Care [name] [name]: [name] Other Clinician: Referring [name] [name]: Treating Twyla Dais/Extender: [name] in Treatment: 15 Wound Status Wound Number: 2 Primary Pressure Ulcer Etiology: Wound Location: Right, Medial Calcaneus Wound Open Wounding Event: Pressure Injury Status: Date Acquired: 10/17/2020 Comorbid Cataracts, Sleep Apnea, Arrhythmia, Congestive Heart Failure, Weeks Of Treatment: 11 History: Coronary Artery Disease, Hypertension, Myocardial Infarction, Clustered Wound: No End Stage Renal Disease, Gout, Dementia, Neuropathy Photos Wound Measurements Length: (cm) 2 Width: (cm) 4 Depth: (cm) 0.1 Area: (cm) 6.283 Volume: (cm) 0.628 % Reduction in Area: -567% % Reduction in Volume: -568.1% Epithelialization: Small (1-33%) Tunneling: No Undermining: No Wound Description Classification: Category/Stage III Wound Margin: Distinct, outline attached Exudate Amount: Medium Exudate Type: Serous Exudate Color: amber Foul Odor After Cleansing: No Slough/Fibrino No Wound Bed Granulation Amount: Large (67-100%) Exposed Structure Granulation Quality: Red, Pink Fascia Exposed: No Necrotic Amount: None Present (0%) Fat Layer (Subcutaneous Tissue) Exposed: Yes Tendon Exposed: No Muscle Exposed: No Joint Exposed: No Bone Exposed: No Treatment Notes Wound #2 (Calcaneus) Wound Laterality: Right, Medial Cleanser Soap and Water Discharge Instruction: May  shower and wash wound with dial antibacterial soap and water prior to dressing change. Peri-Wound Care Topical Primary Dressing Hydrofera Blue Classic Foam, 2x2 in Discharge Instruction: Moisten with saline prior to applying over the santyl. Santyl Ointment Discharge Instruction: Apply nickel thick amount directly to wound bed. Secondary Dressing Woven Gauze Sponge, Non-Sterile 4x4 in Discharge Instruction: Apply over primary dressing as directed. Secured With [employer], 4.5x3.1 (in/yd) Discharge Instruction: Secure with Kerlix as directed. 76M Medipore H Soft Cloth Surgical T ape, 4 x 10 (in/yd) Discharge Instruction: Secure with tape as directed. Compression Wrap Compression Stockings Add-Ons Electronic Signature(s) Signed: 01/04/2021 5:43:10 PM By: Baruch Gouty RN, BSN Signed: 01/04/2021 5:44:25 PM By: Deon Pilling RN, BSN Entered By: Deon Pilling on 01/04/2021 15:08:09 -------------------------------------------------------------------------------- Wound Assessment Details Patient Name: Date of Service: [name] [name], [name] 01/04/2021 2:45 PM Medical Record Number: [id number] Patient Account Number: [id number] Date of Birth/Sex: Treating RN: [birth year] (88 y.o. [name] [name] Primary Care Jaskiran Pata: [name] Other Clinician: Referring Angeleen Horney: Treating Brysin Towery/Extender: [name] in Treatment: 15 Wound Status Wound Number: 3 Primary Venous Leg Ulcer Etiology: Wound Location: Left, Lateral Ankle Wound Open Wounding Event: Gradually Appeared Status: Date Acquired: 10/12/2020 Comorbid Cataracts, Sleep Apnea, Arrhythmia, Congestive Heart Failure, Weeks Of Treatment: 7 History: Coronary Artery Disease, Hypertension, Myocardial Infarction, Clustered Wound: No End Stage Renal Disease, Gout, Dementia, Neuropathy Photos Wound Measurements Length: (cm) 1.7 Width: (cm) 1.1 Depth: (cm) 0.2 Area: (cm) 1.469 Volume: (cm)  0.294 % Reduction in Area: -192% % Reduction in Volume: -94.7% Epithelialization: Small (1-33%) Tunneling: No Undermining: No Wound Description Classification: Full Thickness Without Exposed  Support Structu Wound Margin: Distinct, outline attached Exudate Amount: Medium Exudate Type: Serosanguineous Exudate Color: red, brown res Foul Odor After Cleansing: No Slough/Fibrino Yes Wound Bed Granulation Amount: Large (67-100%) Exposed Structure Granulation Quality: Red Fascia Exposed: No Necrotic Amount: Small (1-33%) Fat Layer (Subcutaneous Tissue) Exposed: Yes Necrotic Quality: Adherent Slough Tendon Exposed: No Muscle Exposed: No Joint Exposed: No Bone Exposed: No Treatment Notes Wound #3 (Ankle) Wound Laterality: Left, Lateral Cleanser Soap and Water Discharge Instruction: May shower and wash wound with dial antibacterial soap and water prior to dressing change. Peri-Wound Care Topical Primary Dressing Hydrofera Blue Classic Foam, 2x2 in Discharge Instruction: Moisten with saline prior to applying over the santyl. Santyl Ointment Discharge Instruction: Apply nickel thick amount directly to wound bed. Secondary Dressing Woven Gauze Sponge, Non-Sterile 4x4 in Discharge Instruction: Apply over primary dressing as directed. Secured With [employer], 4.5x3.1 (in/yd) Discharge Instruction: Secure with Kerlix as directed. 82M Medipore H Soft Cloth Surgical T ape, 4 x 10 (in/yd) Discharge Instruction: Secure with tape as directed. Compression Wrap Compression Stockings Add-Ons Electronic Signature(s) Signed: 01/04/2021 5:43:10 PM By: Baruch Gouty RN, BSN Signed: 01/04/2021 5:44:25 PM By: Deon Pilling RN, BSN Entered By: Deon Pilling on 01/04/2021 15:09:02 -------------------------------------------------------------------------------- Wound Assessment Details Patient Name: Date of Service: [name] [name], [name] 01/04/2021 2:45 PM Medical Record Number:  [id number] Patient Account Number: [id number] Date of Birth/Sex: Treating RN: [birth year] (88 y.o. [name] [name] Primary Care [name] [name]: [name] Other Clinician: Referring Michaele Amundson: Treating Alexzander Dolinger/Extender: [name] in Treatment: 15 Wound Status Wound Number: 4 Primary Abscess Etiology: Wound Location: Left, Medial Lower Leg Wound Open Wound Open Wounding Event: Other Lesion Status: Date Acquired: 11/16/2020 Comorbid Cataracts, Sleep Apnea, Arrhythmia, Congestive Heart Failure, Weeks Of Treatment: 7 History: Coronary Artery Disease, Hypertension, Myocardial Infarction, Clustered Wound: No End Stage Renal Disease, Gout, Dementia, Neuropathy Photos Wound Measurements Length: (cm) 1.4 Width: (cm) 1.2 Depth: (cm) 0.3 Area: (cm) 1.319 Volume: (cm) 0.396 % Reduction in Area: -573% % Reduction in Volume: -123.7% Epithelialization: Small (1-33%) Tunneling: No Undermining: No Wound Description Classification: Full Thickness Without Exposed Support Structures Wound Margin: Well defined, not attached Exudate Amount: Medium Exudate Type: Serosanguineous Exudate Color: red, brown Foul Odor After Cleansing: No Slough/Fibrino Yes Wound Bed Granulation Amount: Medium (34-66%) Exposed Structure Granulation Quality: Red, Pink Fascia Exposed: No Necrotic Amount: Medium (34-66%) Fat Layer (Subcutaneous Tissue) Exposed: Yes Necrotic Quality: Adherent Slough Tendon Exposed: No Muscle Exposed: No Joint Exposed: No Bone Exposed: No Treatment Notes Wound #4 (Lower Leg) Wound Laterality: Left, Medial Cleanser Soap and Water Discharge Instruction: May shower and wash wound with dial antibacterial soap and water prior to dressing change. Peri-Wound Care Topical Primary Dressing Hydrofera Blue Classic Foam, 2x2 in Discharge Instruction: Moisten with saline prior to applying over the santyl. Santyl Ointment Discharge Instruction: Apply nickel  thick amount directly to wound bed. Secondary Dressing Woven Gauze Sponge, Non-Sterile 4x4 in Discharge Instruction: Apply over primary dressing as directed. Secured With [employer], 4.5x3.1 (in/yd) Discharge Instruction: Secure with Kerlix as directed. 82M Medipore H Soft Cloth Surgical T ape, 4 x 10 (in/yd) Discharge Instruction: Secure with tape as directed. Compression Wrap Compression Stockings Add-Ons Electronic Signature(s) Signed: 01/04/2021 5:43:10 PM By: Baruch Gouty RN, BSN Signed: 01/04/2021 5:44:25 PM By: Deon Pilling RN, BSN Entered By: Deon Pilling on 01/04/2021 15:09:30 -------------------------------------------------------------------------------- Wound Assessment Details Patient Name: Date of Service: [name] [name], [name] 01/04/2021 2:45 PM Medical Record  Number: [id number] Patient Account Number: [id number] Date of Birth/Sex: Treating RN: 1926/02/25 (88 y.o. [name] [name] Primary Care [name] [name]: [name] Other Clinician: Referring Melinda Gwinner: Treating Zahira Brummond/Extender: [name] in Treatment: 15 Wound Status Wound Number: 6 Primary Arterial Insufficiency Ulcer Etiology: Wound Location: Right, Lateral Ankle Wound Open Wounding Event: Gradually Appeared Status: Date Acquired: 11/22/2020 Comorbid Cataracts, Sleep Apnea, Arrhythmia, Congestive Heart Failure, Weeks Of Treatment: 2 History: Coronary Artery Disease, Hypertension, Myocardial Infarction, Clustered Wound: No End Stage Renal Disease, Gout, Dementia, Neuropathy Photos Wound Measurements Length: (cm) 1.2 Width: (cm) 0.7 Depth: (cm) 0.1 Area: (cm) 0.66 Volume: (cm) 0.066 % Reduction in Area: -140% % Reduction in Volume: 51.8% Epithelialization: None Tunneling: No Undermining: No Wound Description Classification: Unclassifiable Wound Margin: Distinct, outline attached Exudate Amount: Medium Exudate Type: Serosanguineous Exudate Color: red,  brown Foul Odor After Cleansing: No Slough/Fibrino No Wound Bed Necrotic Amount: Large (67-100%) Exposed Structure Necrotic Quality: Eschar, Adherent Slough Fascia Exposed: No Fat Layer (Subcutaneous Tissue) Exposed: No Tendon Exposed: No Muscle Exposed: No Joint Exposed: No Bone Exposed: No Treatment Notes Wound #6 (Ankle) Wound Laterality: Right, Lateral Cleanser Soap and Water Discharge Instruction: May shower and wash wound with dial antibacterial soap and water prior to dressing change. Peri-Wound Care Topical Primary Dressing Hydrofera Blue Classic Foam, 2x2 in Discharge Instruction: Moisten with saline prior to applying over the santyl. Santyl Ointment Discharge Instruction: Apply nickel thick amount directly to wound bed. Secondary Dressing Woven Gauze Sponge, Non-Sterile 4x4 in Discharge Instruction: Apply over primary dressing as directed. Secured With [employer], 4.5x3.1 (in/yd) Discharge Instruction: Secure with Kerlix as directed. 45M Medipore H Soft Cloth Surgical T ape, 4 x 10 (in/yd) Discharge Instruction: Secure with tape as directed. Compression Wrap Compression Stockings Add-Ons Electronic Signature(s) Signed: 01/04/2021 5:43:10 PM By: Baruch Gouty RN, BSN Signed: 01/04/2021 5:44:25 PM By: Deon Pilling RN, BSN Entered By: Deon Pilling on 01/04/2021 15:10:22 -------------------------------------------------------------------------------- Vitals Details Patient Name: Date of Service: [name] [name], [name] 01/04/2021 2:45 PM Medical Record Number: [id number] Patient Account Number: [id number] Date of Birth/Sex: Treating RN: 10/16/1926 (88 y.o. [name] [name] Primary Care Zasha Belleau: [name] Other Clinician: Referring Jaquin Coy: Treating Francisco Eyerly/Extender: Eli Hose in Treatment: 15 Vital Signs Time Taken: 15:04 Temperature (F): 98.1 Height (in): 72 Pulse (bpm): 73 Weight (lbs): 223 Respiratory Rate  (breaths/min): 20 Body Mass Index (BMI): 30.2 Blood Pressure (mmHg): 141/65 Reference Range: 80 - 120 mg / dl Electronic Signature(s) Signed: 01/10/2021 3:01:20 PM By: Sandre Kitty Entered By: Sandre Kitty on 01/04/2021 15:04:45

## 2021-01-11 DIAGNOSIS — N179 Acute kidney failure, unspecified: Secondary | ICD-10-CM | POA: Diagnosis not present

## 2021-01-11 DIAGNOSIS — S81802D Unspecified open wound, left lower leg, subsequent encounter: Secondary | ICD-10-CM | POA: Diagnosis not present

## 2021-01-11 DIAGNOSIS — I130 Hypertensive heart and chronic kidney disease with heart failure and stage 1 through stage 4 chronic kidney disease, or unspecified chronic kidney disease: Secondary | ICD-10-CM | POA: Diagnosis not present

## 2021-01-11 DIAGNOSIS — I5032 Chronic diastolic (congestive) heart failure: Secondary | ICD-10-CM | POA: Diagnosis not present

## 2021-01-11 DIAGNOSIS — I5021 Acute systolic (congestive) heart failure: Secondary | ICD-10-CM | POA: Diagnosis not present

## 2021-01-11 DIAGNOSIS — M353 Polymyalgia rheumatica: Secondary | ICD-10-CM | POA: Diagnosis not present

## 2021-01-11 DIAGNOSIS — N39498 Other specified urinary incontinence: Secondary | ICD-10-CM | POA: Diagnosis not present

## 2021-01-11 DIAGNOSIS — R338 Other retention of urine: Secondary | ICD-10-CM | POA: Diagnosis not present

## 2021-01-11 DIAGNOSIS — D631 Anemia in chronic kidney disease: Secondary | ICD-10-CM | POA: Diagnosis not present

## 2021-01-11 DIAGNOSIS — M8008XD Age-related osteoporosis with current pathological fracture, vertebra(e), subsequent encounter for fracture with routine healing: Secondary | ICD-10-CM | POA: Diagnosis not present

## 2021-01-11 DIAGNOSIS — B965 Pseudomonas (aeruginosa) (mallei) (pseudomallei) as the cause of diseases classified elsewhere: Secondary | ICD-10-CM | POA: Diagnosis not present

## 2021-01-11 DIAGNOSIS — I480 Paroxysmal atrial fibrillation: Secondary | ICD-10-CM | POA: Diagnosis not present

## 2021-01-11 DIAGNOSIS — N1831 Chronic kidney disease, stage 3a: Secondary | ICD-10-CM | POA: Diagnosis not present

## 2021-01-11 DIAGNOSIS — L89611 Pressure ulcer of right heel, stage 1: Secondary | ICD-10-CM | POA: Diagnosis not present

## 2021-01-11 DIAGNOSIS — N390 Urinary tract infection, site not specified: Secondary | ICD-10-CM | POA: Diagnosis not present

## 2021-01-11 DIAGNOSIS — N401 Enlarged prostate with lower urinary tract symptoms: Secondary | ICD-10-CM | POA: Diagnosis not present

## 2021-01-12 DIAGNOSIS — I259 Chronic ischemic heart disease, unspecified: Secondary | ICD-10-CM | POA: Diagnosis not present

## 2021-01-12 DIAGNOSIS — R338 Other retention of urine: Secondary | ICD-10-CM | POA: Diagnosis not present

## 2021-01-12 DIAGNOSIS — G4733 Obstructive sleep apnea (adult) (pediatric): Secondary | ICD-10-CM | POA: Diagnosis not present

## 2021-01-12 DIAGNOSIS — M5416 Radiculopathy, lumbar region: Secondary | ICD-10-CM | POA: Diagnosis not present

## 2021-01-13 DIAGNOSIS — R338 Other retention of urine: Secondary | ICD-10-CM | POA: Diagnosis not present

## 2021-01-16 ENCOUNTER — Telehealth: Payer: Self-pay

## 2021-01-16 NOTE — Telephone Encounter (Signed)
Patient called to get lab results because he did not receive a call yet. A mychart message was sent by Dr. Ernst Bowler but not review by the patient. Lab results were provided to the patient via phone today and patient said he understood the results and instructions.

## 2021-01-16 NOTE — Telephone Encounter (Signed)
Thanks much!  Tonjia Parillo, MD [facility]  

## 2021-01-18 ENCOUNTER — Encounter (HOSPITAL_BASED_OUTPATIENT_CLINIC_OR_DEPARTMENT_OTHER): Payer: Medicare Other | Admitting: Physician Assistant

## 2021-01-20 ENCOUNTER — Telehealth: Payer: Self-pay | Admitting: Student

## 2021-01-20 ENCOUNTER — Other Ambulatory Visit: Payer: Self-pay

## 2021-01-20 ENCOUNTER — Telehealth: Payer: Self-pay

## 2021-01-20 MED ORDER — ATORVASTATIN CALCIUM 40 MG PO TABS: 40.0000 mg | ORAL_TABLET | Freq: Every day | ORAL | 1 refills | Status: DC

## 2021-01-20 NOTE — Telephone Encounter (Signed)
Yes that is fine

## 2021-01-20 NOTE — Telephone Encounter (Signed)
Med has been refilled.

## 2021-01-23 ENCOUNTER — Ambulatory Visit: Payer: Medicare Other

## 2021-01-23 DIAGNOSIS — M0600 Rheumatoid arthritis without rheumatoid factor, unspecified site: Secondary | ICD-10-CM | POA: Diagnosis not present

## 2021-01-23 DIAGNOSIS — M159 Polyosteoarthritis, unspecified: Secondary | ICD-10-CM | POA: Diagnosis not present

## 2021-01-23 DIAGNOSIS — M353 Polymyalgia rheumatica: Secondary | ICD-10-CM | POA: Diagnosis not present

## 2021-01-23 DIAGNOSIS — M109 Gout, unspecified: Secondary | ICD-10-CM | POA: Diagnosis not present

## 2021-01-25 ENCOUNTER — Other Ambulatory Visit: Payer: Self-pay

## 2021-01-25 ENCOUNTER — Encounter (HOSPITAL_BASED_OUTPATIENT_CLINIC_OR_DEPARTMENT_OTHER): Payer: Medicare Other | Admitting: Physician Assistant

## 2021-01-25 DIAGNOSIS — L97322 Non-pressure chronic ulcer of left ankle with fat layer exposed: Secondary | ICD-10-CM | POA: Diagnosis not present

## 2021-01-25 DIAGNOSIS — L0889 Other specified local infections of the skin and subcutaneous tissue: Secondary | ICD-10-CM | POA: Diagnosis not present

## 2021-01-25 DIAGNOSIS — L97822 Non-pressure chronic ulcer of other part of left lower leg with fat layer exposed: Secondary | ICD-10-CM | POA: Diagnosis not present

## 2021-01-25 DIAGNOSIS — L97312 Non-pressure chronic ulcer of right ankle with fat layer exposed: Secondary | ICD-10-CM | POA: Diagnosis not present

## 2021-01-25 DIAGNOSIS — L89613 Pressure ulcer of right heel, stage 3: Secondary | ICD-10-CM | POA: Diagnosis not present

## 2021-01-25 NOTE — Progress Notes (Addendum)
[name], [name] IMarland Kitchen ([id number]) Visit Report for 01/25/2021 Chief Complaint Document Details Patient Name: Date of Service: [name] [name] ID [name]. 01/25/2021 1:30 PM Medical Record Number: [id number] Patient Account Number: [id number] Date of Birth/Sex: Treating RN: Apr 24, 1926 (88 y.o. [name] [name] Primary Care Provider: [name] Other Clinician: Referring Provider: Treating Provider/Extender: Eli Hose in Treatment: 18 Information Obtained from: Patient Chief Complaint Pressure ulcer right heel and left LE and ankle ulcers Electronic Signature(s) Signed: 01/25/2021 1:47:54 PM By: [name] PA-C Entered By: [name] on 01/25/2021 13:47:54 -------------------------------------------------------------------------------- Debridement Details Patient Name: Date of Service: [name] RK, [name] ID [name]. 01/25/2021 1:30 PM Medical Record Number: [id number] Patient Account Number: [id number] Date of Birth/Sex: Treating RN: [birth year] (88 y.o. [name] Primary Care Provider: [name] Other Clinician: Referring Provider: Treating Provider/Extender: Eli Hose in Treatment: 18 Debridement Performed for Assessment: Wound #7 Left,Lateral Calcaneus Performed By: Clinician Deon Pilling, RN Debridement Type: Chemical/Enzymatic/Mechanical Agent Used: gauze and wound cleanser Level of Consciousness (Pre-procedure): Awake and Alert Pre-procedure Verification/Time Out No Taken: Bleeding: None Response to Treatment: Procedure was tolerated well Level of Consciousness (Post- Awake and Alert procedure): Post Debridement Measurements of Total Wound Length: (cm) 0.5 Stage: Unstageable/Unclassified Width: (cm) 0.3 Depth: (cm) 0.1 Volume: (cm) 0.012 Character of Wound/Ulcer Post Debridement: Stable Post Procedure Diagnosis Same as Pre-procedure Electronic Signature(s) Signed: 01/25/2021 3:45:35 PM By: [name]  PA-C Signed: 01/25/2021 4:52:26 PM By: Deon Pilling RN, BSN Entered By: Deon Pilling on 01/25/2021 14:15:36 -------------------------------------------------------------------------------- HPI Details Patient Name: Date of Service: [name] [name], [name] ID [name]. 01/25/2021 1:30 PM Medical Record Number: [id number] Patient Account Number: [id number] Date of Birth/Sex: Treating RN: 06/25/1926 (88 y.o. [name] [name] Primary Care Provider: [name] Other Clinician: Referring Provider: Treating Provider/Extender: Eli Hose in Treatment: 18 History of Present Illness HPI Description: 09/21/2020 upon evaluation today patient appears to be doing somewhat poorly in regard to a wound in the right lateral heel location. This fortunately does not appear to be too bad but nonetheless is definitely a spot that we need to work on try to get healed for him. Has been here for quite some time. Fortunately there does not appear to be any evidence of active infection at this time which is great news. With that being said I do believe that he has some evidence here of peripheral vascular disease this is minimal his ABI 0.87. Other than that he does have a history of BPH, chronic kidney disease stage III, and hypertension. Currently he has been using different medication such as Silvadene or antibiotic ointment to try to help treat things. This just is not helping out as efficiently as it should be. Of note patient's history includes that of a triple bypass in 2010, a kyphoplasty at L1 which was done 8 months ago, unfortunately he had a fracture 2 months ago which she sustained a T7/T8 fracture to his spine which ended up with him being in the hospital. It is in that time since then that he developed this wound roughly 2 to 3 months ago is not exactly sure when. He does typically wear compression socks in the 20 to 30 mmHg range. 09/28/2020 upon evaluation today patient presents for follow-up  and overall seems to be doing more poorly based on what [name] am seeing currently. His ABI last time was measured at 0.87 but [name]  am beginning to think this may be falsely elevated due to the fact that there is more eschar and subsequently a concern here of vascular compromise that may be part of her issue. [name] think that we need to avoid any additional sharp debridement and make a referral to vascular surgery as soon as possible here. Subsequently also went ahead and did discuss the possibility of starting the patient on Santyl instead of the collagen in order to help clean up the surface of the wound this will take the place of sharp debridement currently. 10/19/2020 upon evaluation today patient's wound actually appears to be doing okay although he has an area on the medial aspect of his heel that is only been showing up for the past couple of days this is more of a blister that has ruptured. The good news is there is no deep wound here which is good. Fortunately there is no signs of active infection at this time also good news. With that being said however the blood flow was not good with an ABI of 0.67 on the right it was around 1 on the left nonetheless [name] think he does need to see vascular we put that referral in last week on the 12th Dr. Dellia Nims reviewed that for me and made that recommendations. Nonetheless the patient has not heard anything from vascular yet as far as getting this scheduled. [name] think this needs to be done sooner rather than later as [name] am concerned about the possibility of a limb threatening situation if we do not get this under control.. 11/16/2020 upon evaluation today patient presents for follow-up regarding issues that he has been having with his bilateral lower extremities in particular. He in fact still has an issue with his right heel. This appears to be a stage III pressure ulcer at this point there is some slough noted. Subsequently he does have revascularization that is taking  place on the right leg as a follow-up later this month in that regard. No repeat ABIs. Subsequently the patient also has venous stasis bilaterally which is also problematic for him at this time. He has some ulcers on the left ankle and left leg that are getting need to be addressed as well. 12/21/2020 upon evaluation today patient appears to be doing poorly in regard to his bilateral lower extremity ulcers. He still has areas on both lower extremities currently ineffective even some areas that were not there previous. Fortunately there is no signs of active infection at this time. No fevers, chills, nausea, vomiting, or diarrhea. 01/04/21 upon evaluation today patient actually appears to be doing quite well in regard to his wounds. Fortunately there does not appear to be any signs of active infection which is great. There is some necrotic tissue that [name] think would be helpful to debride away pretty much on all wounds other than the heel which appears to be doing quite well. 01/25/2021 upon evaluation today patient appears to be doing okay in regard to his wounds. There are some measurements that are definitely smaller which is good news. Fortunately I do not see any evidence of active infection locally nor systemically at this point which is also great news. He does have a deep tissue injury which seems to be getting a bit worse on the lateral portion of his right heel. He tells me is not been using the bunny boots and he has not been using a pillow under his calves either. Nonetheless [name] think this is absolutely a pressure injury  and [name] am very concerned about this. Also I do not want this to get a lot worse. He also tells me his hemoglobin is around 7 he is going to be seeing hematology for a transfusion either today or tomorrow most likely this will definitely help with his healing. Electronic Signature(s) Signed: 01/25/2021 2:34:15 PM By: [name] PA-C Entered By: [name] on 01/25/2021  14:34:15 -------------------------------------------------------------------------------- Physical Exam Details Patient Name: Date of Service: [name] RK, [name] ID [name]. 01/25/2021 1:30 PM Medical Record Number: [id number] Patient Account Number: [id number] Date of Birth/Sex: Treating RN: 03/29/1926 (88 y.o. [name] [name] Primary Care Provider: [name] Other Clinician: Referring Provider: Treating Provider/Extender: [name] in Treatment: 68 Constitutional Well-nourished and well-hydrated in no acute distress. Respiratory normal breathing without difficulty. Psychiatric this patient is able to make decisions and demonstrates good insight into disease process. Alert and Oriented x 3. pleasant and cooperative. Notes Upon inspection patient's wound bed actually showed signs of across the board of having still some slough and biofilm almost to the wounds. I do think maybe switching over to Iodosorb could be beneficial for him. [name] will go ahead and see about making that change at this point. Electronic Signature(s) Signed: 01/25/2021 2:34:35 PM By: [name] PA-C Entered By: [name] on 01/25/2021 14:34:34 -------------------------------------------------------------------------------- Physician Orders Details Patient Name: Date of Service: [name] RK, [name] ID [name]. 01/25/2021 1:30 PM Medical Record Number: [id number] Patient Account Number: [id number] Date of Birth/Sex: Treating RN: 1926-01-05 (88 y.o. [name], [name] Primary Care Provider: [name] Other Clinician: Referring Provider: Treating Provider/Extender: Eli Hose in Treatment: 38 Verbal / Phone Orders: No Diagnosis Coding ICD-10 Coding Code Description L89.613 Pressure ulcer of right heel, stage 3 I73.89 Other specified peripheral vascular diseases I87.2 Venous insufficiency (chronic) (peripheral) L97.822 Non-pressure chronic ulcer of other part of left  lower leg with fat layer exposed L97.312 Non-pressure chronic ulcer of right ankle with fat layer exposed N40.1 Benign prostatic hyperplasia with lower urinary tract symptoms N18.30 Chronic kidney disease, stage 3 unspecified I10 Essential (primary) hypertension Follow-up Appointments ppointment in 1 week. Margarita Grizzle Wednesday ***Extra time 60 minutes*** Return A Other: - [employer]- supplies. Bathing/ Shower/ Hygiene May shower and wash wound with soap and water. - with dressing changes. Edema Control - Lymphedema / SCD / Other Elevate legs to the level of the heart or above for 30 minutes daily and/or when sitting, a frequency of: - throughout the day. Avoid standing for long periods of time. Moisturize legs daily. - both legs and feet with dressing changes. Off-Loading Other: - bunny boots to both feet while resting in bed or chair. If unable to use bunny boots use pillow to elevate both legs to offload pressure to heels. Ensure no pressure to wounds to aid in healing. Additional Orders / Instructions Other: - May continue to walk and work with physical therapy. Wound Treatment Wound #1 - Calcaneus Wound Laterality: Right, Lateral Cleanser: Soap and Water Every Other Day/30 Days Discharge Instructions: May shower and wash wound with dial antibacterial soap and water prior to dressing change. Prim Dressing: Iodosorb Gel 10 (gm) Tube (DME) (Generic) Every Other Day/30 Days ary Discharge Instructions: Apply to wound bed as instructed Secondary Dressing: Woven Gauze Sponge, Non-Sterile 4x4 in (Generic) Every Other Day/30 Days Discharge Instructions: Apply over primary dressing as directed. Secondary Dressing: ALLEVYN Heel 4 1/2in x 5 1/2in /  10.5cm x 13.5cm (DME) (Generic) Every Other Day/30 Days Discharge Instructions: Apply over primary dressing as directed. Secured With: [employer], 4.5x3.1 (in/yd) (Generic) Every Other Day/30 Days Discharge Instructions: Secure with  Kerlix as directed. Secured With: 56M Medipore H Soft Cloth Surgical T ape, 4 x 10 (in/yd) (Generic) Every Other Day/30 Days Discharge Instructions: Secure with tape as directed. Wound #2 - Calcaneus Wound Laterality: Right, Medial Cleanser: Soap and Water Every Other Day/30 Days Discharge Instructions: May shower and wash wound with dial antibacterial soap and water prior to dressing change. Prim Dressing: Iodosorb Gel 10 (gm) Tube (DME) (Generic) Every Other Day/30 Days ary Discharge Instructions: Apply to wound bed as instructed Secondary Dressing: Woven Gauze Sponge, Non-Sterile 4x4 in (Generic) Every Other Day/30 Days Discharge Instructions: Apply over primary dressing as directed. Secondary Dressing: ALLEVYN Heel 4 1/2in x 5 1/2in / 10.5cm x 13.5cm (DME) (Generic) Every Other Day/30 Days Discharge Instructions: Apply over primary dressing as directed. Secured With: [employer], 4.5x3.1 (in/yd) (Generic) Every Other Day/30 Days Discharge Instructions: Secure with Kerlix as directed. Secured With: 56M Medipore H Soft Cloth Surgical T ape, 4 x 10 (in/yd) (Generic) Every Other Day/30 Days Discharge Instructions: Secure with tape as directed. Wound #3 - Ankle Wound Laterality: Left, Lateral Cleanser: Soap and Water Every Other Day/30 Days Discharge Instructions: May shower and wash wound with dial antibacterial soap and water prior to dressing change. Prim Dressing: Iodosorb Gel 10 (gm) Tube (DME) (Generic) Every Other Day/30 Days ary Discharge Instructions: Apply to wound bed as instructed Secondary Dressing: Zetuvit Plus Silicone Border Dressing 4x4 (in/in) (DME) (Generic) Every Other Day/30 Days Discharge Instructions: Apply silicone border over primary dressing as directed. Wound #4 - Lower Leg Wound Laterality: Left, Medial Cleanser: Soap and Water Every Other Day/30 Days Discharge Instructions: May shower and wash wound with dial antibacterial soap and water prior to dressing  change. Prim Dressing: Iodosorb Gel 10 (gm) Tube (DME) (Generic) Every Other Day/30 Days ary Discharge Instructions: Apply to wound bed as instructed Secondary Dressing: Zetuvit Plus Silicone Border Dressing 4x4 (in/in) (DME) (Generic) Every Other Day/30 Days Discharge Instructions: Apply silicone border over primary dressing as directed. Wound #6 - Ankle Wound Laterality: Right, Lateral Cleanser: Soap and Water Every Other Day/30 Days Discharge Instructions: May shower and wash wound with dial antibacterial soap and water prior to dressing change. Prim Dressing: Iodosorb Gel 10 (gm) Tube Every Other Day/30 Days ary Discharge Instructions: Apply to wound bed as instructed Secondary Dressing: Zetuvit Plus Silicone Border Dressing 4x4 (in/in) (DME) (Generic) Every Other Day/30 Days Discharge Instructions: Apply silicone border over primary dressing as directed. Wound #7 - Calcaneus Wound Laterality: Left, Lateral Cleanser: Soap and Water Every Other Day/30 Days Discharge Instructions: May shower and wash wound with dial antibacterial soap and water prior to dressing change. Prim Dressing: Iodosorb Gel 10 (gm) Tube (DME) (Generic) Every Other Day/30 Days ary Discharge Instructions: Apply to wound bed as instructed Secondary Dressing: Woven Gauze Sponge, Non-Sterile 4x4 in (Generic) Every Other Day/30 Days Discharge Instructions: Apply over primary dressing as directed. Secondary Dressing: ALLEVYN Heel 4 1/2in x 5 1/2in / 10.5cm x 13.5cm (DME) (Generic) Every Other Day/30 Days Discharge Instructions: Apply over primary dressing as directed. Secured With: [employer], 4.5x3.1 (in/yd) (Generic) Every Other Day/30 Days Discharge Instructions: Secure with Kerlix as directed. Secured With: 56M Medipore H Soft Cloth Surgical T ape, 4 x 10 (in/yd) (Generic) Every Other Day/30 Days Discharge Instructions: Secure with tape as directed. Electronic  Signature(s) Signed: 01/25/2021 3:45:35 PM By:  [name] PA-C Signed: 01/25/2021 4:52:26 PM By: Deon Pilling RN, BSN Entered By: Deon Pilling on 01/25/2021 14:26:10 -------------------------------------------------------------------------------- Problem List Details Patient Name: Date of Service: [name] [name], [name] ID [name]. 01/25/2021 1:30 PM Medical Record Number: [id number] Patient Account Number: [id number] Date of Birth/Sex: Treating RN: Nov 11, 1926 (88 y.o. [name] [name] Primary Care Provider: [name] Other Clinician: Referring Provider: Treating Provider/Extender: Eli Hose in Treatment: 19 Active Problems ICD-10 Encounter Code Description Active Date MDM Diagnosis L89.613 Pressure ulcer of right heel, stage 3 09/21/2020 No Yes I73.89 Other specified peripheral vascular diseases 09/21/2020 No Yes I87.2 Venous insufficiency (chronic) (peripheral) 11/16/2020 No Yes L97.822 Non-pressure chronic ulcer of other part of left lower leg with fat layer 11/16/2020 No Yes exposed L97.312 Non-pressure chronic ulcer of right ankle with fat layer exposed 11/16/2020 No Yes N40.1 Benign prostatic hyperplasia with lower urinary tract symptoms 09/21/2020 No Yes N18.30 Chronic kidney disease, stage 3 unspecified 09/21/2020 No Yes I10 Essential (primary) hypertension 09/21/2020 No Yes Inactive Problems Resolved Problems Electronic Signature(s) Signed: 01/25/2021 1:47:45 PM By: [name] PA-C Entered By: [name] on 01/25/2021 13:47:45 -------------------------------------------------------------------------------- Progress Note Details Patient Name: Date of Service: [name] RK, [name] ID [name]. 01/25/2021 1:30 PM Medical Record Number: [id number] Patient Account Number: [id number] Date of Birth/Sex: Treating RN: 28-Apr-1926 (88 y.o. [name] [name] Primary Care Provider: [name] Other Clinician: Referring Provider: Treating Provider/Extender: Eli Hose in Treatment:  18 Subjective Chief Complaint Information obtained from Patient Pressure ulcer right heel and left LE and ankle ulcers History of Present Illness (HPI) 09/21/2020 upon evaluation today patient appears to be doing somewhat poorly in regard to a wound in the right lateral heel location. This fortunately does not appear to be too bad but nonetheless is definitely a spot that we need to work on try to get healed for him. Has been here for quite some time. Fortunately there does not appear to be any evidence of active infection at this time which is great news. With that being said I do believe that he has some evidence here of peripheral vascular disease this is minimal his ABI 0.87. Other than that he does have a history of BPH, chronic kidney disease stage III, and hypertension. Currently he has been using different medication such as Silvadene or antibiotic ointment to try to help treat things. This just is not helping out as efficiently as it should be. Of note patient's history includes that of a triple bypass in 2010, a kyphoplasty at L1 which was done 8 months ago, unfortunately he had a fracture 2 months ago which she sustained a T7/T8 fracture to his spine which ended up with him being in the hospital. It is in that time since then that he developed this wound roughly 2 to 3 months ago is not exactly sure when. He does typically wear compression socks in the 20 to 30 mmHg range. 09/28/2020 upon evaluation today patient presents for follow-up and overall seems to be doing more poorly based on what [name] am seeing currently. His ABI last time was measured at 0.87 but [name] am beginning to think this may be falsely elevated due to the fact that there is more eschar and subsequently a concern here of vascular compromise that may be part of her issue. [name] think that we need to avoid any additional sharp debridement and make a  referral to vascular surgery as soon as possible here. Subsequently also went ahead  and did discuss the possibility of starting the patient on Santyl instead of the collagen in order to help clean up the surface of the wound this will take the place of sharp debridement currently. 10/19/2020 upon evaluation today patient's wound actually appears to be doing okay although he has an area on the medial aspect of his heel that is only been showing up for the past couple of days this is more of a blister that has ruptured. The good news is there is no deep wound here which is good. Fortunately there is no signs of active infection at this time also good news. With that being said however the blood flow was not good with an ABI of 0.67 on the right it was around 1 on the left nonetheless [name] think he does need to see vascular we put that referral in last week on the 12th Dr. Dellia Nims reviewed that for me and made that recommendations. Nonetheless the patient has not heard anything from vascular yet as far as getting this scheduled. [name] think this needs to be done sooner rather than later as [name] am concerned about the possibility of a limb threatening situation if we do not get this under control.. 11/16/2020 upon evaluation today patient presents for follow-up regarding issues that he has been having with his bilateral lower extremities in particular. He in fact still has an issue with his right heel. This appears to be a stage III pressure ulcer at this point there is some slough noted. Subsequently he does have revascularization that is taking place on the right leg as a follow-up later this month in that regard. No repeat ABIs. Subsequently the patient also has venous stasis bilaterally which is also problematic for him at this time. He has some ulcers on the left ankle and left leg that are getting need to be addressed as well. 12/21/2020 upon evaluation today patient appears to be doing poorly in regard to his bilateral lower extremity ulcers. He still has areas on both lower  extremities currently ineffective even some areas that were not there previous. Fortunately there is no signs of active infection at this time. No fevers, chills, nausea, vomiting, or diarrhea. 01/04/21 upon evaluation today patient actually appears to be doing quite well in regard to his wounds. Fortunately there does not appear to be any signs of active infection which is great. There is some necrotic tissue that [name] think would be helpful to debride away pretty much on all wounds other than the heel which appears to be doing quite well. 01/25/2021 upon evaluation today patient appears to be doing okay in regard to his wounds. There are some measurements that are definitely smaller which is good news. Fortunately I do not see any evidence of active infection locally nor systemically at this point which is also great news. He does have a deep tissue injury which seems to be getting a bit worse on the lateral portion of his right heel. He tells me is not been using the bunny boots and he has not been using a pillow under his calves either. Nonetheless [name] think this is absolutely a pressure injury and [name] am very concerned about this. Also I do not want this to get a lot worse. He also tells me his hemoglobin is around 7 he is going to be seeing hematology for a transfusion either today or tomorrow most likely this will definitely  help with his healing. Objective Constitutional Well-nourished and well-hydrated in no acute distress. Vitals Time Taken: 1:30 PM, Height: 72 in, Weight: 223 lbs, BMI: 30.2, Temperature: 97.8 F, Pulse: 65 bpm, Respiratory Rate: 20 breaths/min, Blood Pressure: 130/70 mmHg. Respiratory normal breathing without difficulty. Psychiatric this patient is able to make decisions and demonstrates good insight into disease process. Alert and Oriented x 3. pleasant and cooperative. General Notes: Upon inspection patient's wound bed actually showed signs of across the board of having  still some slough and biofilm almost to the wounds. I do think maybe switching over to Iodosorb could be beneficial for him. [name] will go ahead and see about making that change at this point. Integumentary (Hair, Skin) Wound #1 status is Open. Original cause of wound was Pressure Injury. The date acquired was: 06/08/2020. The wound has been in treatment 18 weeks. The wound is located on the Right,Lateral Calcaneus. The wound measures 0.6cm length x 0.6cm width x 0.1cm depth; 0.283cm^2 area and 0.028cm^3 volume. There is Fat Layer (Subcutaneous Tissue) exposed. There is no tunneling or undermining noted. There is a small amount of serosanguineous drainage noted. The wound margin is distinct with the outline attached to the wound base. There is no granulation within the wound bed. There is a large (67-100%) amount of necrotic tissue within the wound bed including Eschar and Adherent Slough. Wound #2 status is Open. Original cause of wound was Pressure Injury. The date acquired was: 10/17/2020. The wound has been in treatment 14 weeks. The wound is located on the Right,Medial Calcaneus. The wound measures 1.7cm length x 3.7cm width x 0.2cm depth; 4.94cm^2 area and 0.988cm^3 volume. There is Fat Layer (Subcutaneous Tissue) exposed. There is no tunneling or undermining noted. There is a medium amount of serosanguineous drainage noted. The wound margin is distinct with the outline attached to the wound base. There is medium (34-66%) red, pink, friable granulation within the wound bed. There is a medium (34-66%) amount of necrotic tissue within the wound bed including Adherent Slough. Wound #3 status is Open. Original cause of wound was Gradually Appeared. The date acquired was: 10/12/2020. The wound has been in treatment 10 weeks. The wound is located on the Left,Lateral Ankle. The wound measures 1.9cm length x 1.3cm width x 0.5cm depth; 1.94cm^2 area and 0.97cm^3 volume. There is Fat Layer (Subcutaneous Tissue)  exposed. There is no tunneling or undermining noted. There is a medium amount of serosanguineous drainage noted. The wound margin is distinct with the outline attached to the wound base. There is small (1-33%) red granulation within the wound bed. There is a large (67-100%) amount of necrotic tissue within the wound bed including Adherent Slough. Wound #4 status is Open. Original cause of wound was Other Lesion. The date acquired was: 11/16/2020. The wound has been in treatment 10 weeks. The wound is located on the Left,Medial Lower Leg. The wound measures 1.2cm length x 1cm width x 0.5cm depth; 0.942cm^2 area and 0.471cm^3 volume. There is Fat Layer (Subcutaneous Tissue) exposed. There is no tunneling or undermining noted. There is a medium amount of serosanguineous drainage noted. The wound margin is well defined and not attached to the wound base. There is medium (34-66%) red, pink granulation within the wound bed. There is a medium (34- 66%) amount of necrotic tissue within the wound bed including Adherent Slough. Wound #6 status is Open. Original cause of wound was Gradually Appeared. The date acquired was: 11/22/2020. The wound has been in treatment 5 weeks. The wound  is located on the Right,Lateral Ankle. The wound measures 1.5cm length x 0.9cm width x 0.3cm depth; 1.06cm^2 area and 0.318cm^3 volume. There is Fat Layer (Subcutaneous Tissue) exposed. There is no tunneling or undermining noted. There is a medium amount of serosanguineous drainage noted. The wound margin is distinct with the outline attached to the wound base. There is small (1-33%) red, pink granulation within the wound bed. There is a large (67- 100%) amount of necrotic tissue within the wound bed including Adherent Slough. Wound #7 status is Open. Original cause of wound was Trauma. The date acquired was: 01/11/2021. The wound is located on the Left,Lateral Calcaneus. The wound measures 0.5cm length x 0.3cm width x 0.1cm depth;  0.118cm^2 area and 0.012cm^3 volume. There is no tunneling or undermining noted. There is a medium amount of serosanguineous drainage noted. The wound margin is distinct with the outline attached to the wound base. There is no granulation within the wound bed. There is a large (67-100%) amount of necrotic tissue within the wound bed including Adherent Slough. General Notes: callous noted to periwound as well. 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 There was a Chemical/Enzymatic/Mechanical debridement performed by Deon Pilling, RN.. Other agent used was gauze and wound cleanser. There was no bleeding. The procedure was tolerated well. Post Debridement Measurements: 0.5cm length x 0.3cm width x 0.1cm depth; 0.012cm^3 volume. Post debridement Stage noted as Unstageable/Unclassified. Character of Wound/Ulcer Post Debridement is stable. Post procedure Diagnosis Wound #7: Same as Pre-Procedure Plan Follow-up Appointments: Return Appointment in 1 week. Margarita Grizzle Wednesday ***Extra time 60 minutes*** Other: - [employer]- supplies. Bathing/ Shower/ Hygiene: May shower and wash wound with soap and water. - with dressing changes. Edema Control - Lymphedema / SCD / Other: Elevate legs to the level of the heart or above for 30 minutes daily and/or when sitting, a frequency of: - throughout the day. Avoid standing for long periods of time. Moisturize legs daily. - both legs and  feet with dressing changes. Off-Loading: Other: - bunny boots to both feet while resting in bed or chair. If unable to use bunny boots use pillow to elevate both legs to offload pressure to heels. Ensure no pressure to wounds to aid in healing. Additional Orders / Instructions: Other: - May continue to walk and work with physical therapy. WOUND #1: - Calcaneus Wound Laterality: Right, Lateral Cleanser: Soap and Water Every Other Day/30 Days Discharge Instructions: May shower and wash wound with dial antibacterial soap and water prior to dressing change. Prim Dressing: Iodosorb Gel 10 (gm) Tube (DME) (Generic) Every Other Day/30 Days ary Discharge Instructions: Apply to wound bed as instructed Secondary Dressing: Woven Gauze Sponge, Non-Sterile 4x4 in (Generic) Every Other Day/30 Days Discharge Instructions: Apply over primary dressing as directed. Secondary Dressing: ALLEVYN Heel 4 1/2in x 5 1/2in / 10.5cm x 13.5cm (DME) (Generic) Every Other Day/30 Days Discharge Instructions: Apply over primary dressing as directed. Secured With: [employer], 4.5x3.1 (in/yd) (Generic) Every Other Day/30 Days Discharge Instructions: Secure with Kerlix as directed. Secured With: [employer]  [facility] T [facility], 4 x 10 (in/yd) (Generic) Every Other Day/30 Days Discharge Instructions: Secure with tape as directed. WOUND #2: - Calcaneus Wound Laterality: Right, Medial Cleanser: Soap and Water Every Other Day/30 Days Discharge Instructions: May shower and wash wound with dial antibacterial soap and water prior to dressing change. Prim Dressing: Iodosorb Gel 10 (gm) Tube (DME) (Generic) Every Other Day/30 Days ary Discharge Instructions: Apply to wound bed as instructed Secondary Dressing: Woven Gauze Sponge, Non-Sterile 4x4 in (Generic) Every Other Day/30 Days Discharge Instructions: Apply over primary dressing as directed. Secondary Dressing: ALLEVYN Heel 4 1/2in x 5 1/2in / 10.5cm x 13.5cm  (DME) (Generic) Every Other Day/30 Days Discharge Instructions: Apply over primary dressing as directed. Secured With: [employer], 4.5x3.1 (in/yd) (Generic) Every Other Day/30 Days Discharge Instructions: Secure with Kerlix as directed. Secured With: 77M Medipore H Soft Cloth Surgical T ape, 4 x 10 (in/yd) (Generic) Every Other Day/30 Days Discharge Instructions: Secure with tape as directed. WOUND #3: - Ankle Wound Laterality: Left, Lateral Cleanser: Soap and Water Every Other Day/30 Days Discharge Instructions: May shower and wash wound with dial antibacterial soap and water prior to dressing change. Prim Dressing: Iodosorb Gel 10 (gm) Tube (DME) (Generic) Every Other Day/30 Days ary Discharge Instructions: Apply to wound bed as instructed Secondary Dressing: Zetuvit Plus Silicone Border Dressing 4x4 (in/in) (DME) (Generic) Every Other Day/30 Days Discharge Instructions: Apply silicone border over primary dressing as directed. WOUND #4: - Lower Leg Wound Laterality: Left, Medial Cleanser: Soap and Water Every Other Day/30 Days Discharge Instructions: May shower and wash wound with dial antibacterial soap and water prior to dressing change. Prim Dressing: Iodosorb Gel 10 (gm) Tube (DME) (Generic) Every Other Day/30 Days ary Discharge Instructions: Apply to wound bed as instructed Secondary Dressing: Zetuvit Plus Silicone Border Dressing 4x4 (in/in) (DME) (Generic) Every Other Day/30 Days Discharge Instructions: Apply silicone border over primary dressing as directed. WOUND #6: - Ankle Wound Laterality: Right, Lateral Cleanser: Soap and Water Every Other Day/30 Days Discharge Instructions: May shower and wash wound with dial antibacterial soap and water prior to dressing change. Prim Dressing: Iodosorb Gel 10 (gm) Tube Every Other Day/30 Days ary Discharge Instructions: Apply to wound bed as instructed Secondary Dressing: Zetuvit Plus Silicone Border Dressing 4x4 (in/in) (DME)  (Generic) Every Other Day/30 Days Discharge Instructions: Apply silicone border over primary dressing as directed. WOUND #7: - Calcaneus Wound Laterality: Left, Lateral Cleanser: Soap and Water Every Other Day/30 Days Discharge Instructions: May shower and wash wound with dial antibacterial soap and water prior to dressing change. Prim Dressing: Iodosorb Gel 10 (gm) Tube (DME) (Generic) Every Other Day/30 Days ary Discharge Instructions: Apply to wound bed as instructed Secondary Dressing: Woven Gauze Sponge, Non-Sterile 4x4 in (Generic) Every Other Day/30 Days Discharge Instructions: Apply over primary dressing as directed. Secondary Dressing: ALLEVYN Heel 4 1/2in x 5 1/2in / 10.5cm x 13.5cm (DME) (Generic) Every Other Day/30 Days Discharge Instructions: Apply over primary dressing as directed. Secured With: [employer], 4.5x3.1 (in/yd) (Generic) Every Other Day/30 Days Discharge Instructions: Secure with Kerlix as directed. Secured With: 77M Med[employer] H Soft Cloth Surgical T ape, 4 x 10 (in/yd) (Generic) Every Other Day/30 Days Discharge Instructions: Secure with tape as directed. 1. Would recommend currently that we go ahead and switch over to Iodosorb we will discontinue the Santyl and Hydrofera Blue at this time. [name] think that absorb may be better. 2. [name] am also can recommend that we have the patient change this  3 times per week which [name] think is appropriate. 3. [name] would also suggest that the patient continue to try to keep his heels off of the bed [name] think that is the best thing to do especially for the lateral heel in order to try to prevent this from worsening on the right. We will see patient back for reevaluation in 1 week here in the clinic. If anything worsens or changes patient will contact our office for additional recommendations. Electronic Signature(s) Signed: 01/25/2021 2:35:35 PM By: [name] PA-C Entered By: [name] on 01/25/2021  14:35:35 -------------------------------------------------------------------------------- SuperBill Details Patient Name: Date of Service: [name] [name], [name] ID [name]. 01/25/2021 Medical Record Number: [id number] Patient Account Number: [id number] Date of Birth/Sex: Treating RN: Jun 07, 1926 (88 y.o. [name], [name] Primary Care Provider: [name] Other Clinician: Referring Provider: Treating Provider/Extender: Eli Hose in Treatment: 18 Diagnosis Coding ICD-10 Codes Code Description [phone] Pressure ulcer of right heel, stage 3 I73.89 Other specified peripheral vascular diseases I87.2 Venous insufficiency (chronic) (peripheral) L97.822 Non-pressure chronic ulcer of other part of left lower leg with fat layer exposed L97.312 Non-pressure chronic ulcer of right ankle with fat layer exposed N40.1 Benign prostatic hyperplasia with lower urinary tract symptoms N18.30 Chronic kidney disease, stage 3 unspecified I10 Essential (primary) hypertension Facility Procedures CPT4 Code: [id number] Description: [phone] - [facility]BRIDE W/O ANES NON SELECT Modifier: Quantity: 1 Physician Procedures : CPT4 Code Description Modifier [id number] - WC PHYS LEVEL 4 - EST PT ICD-10 Diagnosis Description L89.613 Pressure ulcer of right heel, stage 3 I73.89 Other specified peripheral vascular diseases I87.2 Venous insufficiency (chronic) (peripheral)  L97.822 Non-pressure chronic ulcer of other part of left lower leg with fat layer exposed Quantity: 1 Electronic Signature(s) Signed: 01/25/2021 2:38:25 PM By: [name] PA-C Entered By: [name] on 01/25/2021 14:38:24

## 2021-01-25 NOTE — Progress Notes (Signed)
[name], [name] ([id number]) Visit Report for 01/25/2021 Arrival Information Details Patient Name: Date of Service: [name] [name] ID I. 01/25/2021 1:30 PM Medical Record Number: [id number] Patient Account Number: [id number] Date of Birth/Sex: Treating RN: 19-Feb-1926 (88 y.o. [name] [name] Primary Care Ritaj Dullea: [name] Other Clinician: Referring Avanelle Pixley: Treating Eustacio Ellen/Extender: Eli Hose in Treatment: 18 Visit Information History Since Last Visit Added or deleted any medications: No Patient Arrived: Wheel Chair Any new allergies or adverse reactions: No Arrival Time: 13:30 Had a fall or experienced change in No Accompanied By: daughter activities of daily living that may affect Transfer Assistance: Manual risk of falls: Patient Identification Verified: Yes Signs or symptoms of abuse/neglect since last visito No Secondary Verification Process Completed: Yes Hospitalized since last visit: No Patient Requires Transmission-Based Precautions: No Implantable device outside of the clinic excluding No Patient Has Alerts: Yes cellular tissue based products placed in the center Patient Alerts: Patient on Blood Thinner since last visit: Has Dressing in Place as Prescribed: Yes Pain Present Now: No Notes Hgb 7, per patient will be getting an infusion. Electronic Signature(s) Signed: 01/25/2021 4:52:26 PM By: Deon Pilling RN, BSN Entered By: Deon Pilling on 01/25/2021 13:35:18 -------------------------------------------------------------------------------- Encounter Discharge Information Details Patient Name: Date of Service: [name] [name], [name] 01/25/2021 1:30 PM Medical Record Number: [id number] Patient Account Number: [id number] Date of Birth/Sex: Treating RN: 04-Mar-1926 (88 y.o. [name] [name] Primary Care Eleanora Guinyard: [name] Other Clinician: Referring Teola Felipe: Treating [name]/Extender: Eli Hose in  Treatment: 52 Encounter Discharge Information Items Post Procedure Vitals Discharge Condition: Stable Temperature (F): 97.8 Ambulatory Status: Wheelchair Pulse (bpm): 65 Discharge Destination: Home Respiratory Rate (breaths/min): 20 Transportation: Private Auto Blood Pressure (mmHg): 130/70 Accompanied By: daughter Schedule Follow-up Appointment: Yes Clinical Summary of Care: Electronic Signature(s) Signed: 01/25/2021 4:52:26 PM By: Deon Pilling RN, BSN Entered By: Deon Pilling on 01/25/2021 14:45:25 -------------------------------------------------------------------------------- Lower Extremity Assessment Details Patient Name: Date of Service: [name] [name], [name] 01/25/2021 1:30 PM Medical Record Number: [id number] Patient Account Number: [id number] Date of Birth/Sex: Treating RN: 1926-04-11 (88 y.o. [name] [name] Primary Care Nyal Schachter: [name] Other Clinician: Referring Terren Jandreau: Treating Ahlia Lemanski/Extender: [name] in Treatment: 18 Edema Assessment Assessed: Shirlyn Goltz: Yes] Patrice Paradise: Yes] Edema: [Left: Yes] [Right: Yes] Calf Left: Right: Point of Measurement: 34 cm From Medial Instep 39 cm 41 cm Ankle Left: Right: Point of Measurement: 11 cm From Medial Instep 25.5 cm 28 cm Vascular Assessment Pulses: Dorsalis Pedis Palpable: [Left:Yes] [Right:Yes] Electronic Signature(s) Signed: 01/25/2021 4:52:26 PM By: Deon Pilling RN, BSN Entered By: Deon Pilling on 01/25/2021 13:49:57 -------------------------------------------------------------------------------- [facility] [place] Details Patient Name: Date of Service: [name] [name], [name] 01/25/2021 1:30 PM Medical Record Number: [id number] Patient Account Number: [id number] Date of Birth/Sex: Treating RN: [birth year] (88 y.o. [name] [name] Primary Care Arianna Haydon: [name] Other Clinician: Referring Emilia Kayes: Treating Kimblery Diop/Extender: Eli Hose  in Treatment: 71 Active Inactive Tissue Oxygenation Nursing Diagnoses: Potential alteration in peripheral tissue perfusion (select prior to confirmation of diagnosis) Goals: Non-invasive arterial studies are completed as ordered Date Initiated: 09/21/2020 Date Inactivated: 10/19/2020 Target Resolution Date: 09/21/2020 Goal Status: Met Revascularization procedures completed as ordered Date Initiated: 09/21/2020 Target Resolution Date: 03/24/2021 Goal Status: Active Interventions: Assess patient understanding of disease process and management upon diagnosis and as needed Provide education on tissue oxygenation and ischemia Treatment  Activities: Ankle Brachial Index (ABI) : 09/21/2020 Non-invasive vascular studies : 09/21/2020 T ordered outside of clinic : 09/21/2020 est Notes: 10/19/20: Had vascular testing, awaiting appointment to see vascular MD Wound/Skin Impairment Nursing Diagnoses: Knowledge deficit related to ulceration/compromised skin integrity Goals: Patient/caregiver will verbalize understanding of skin care regimen Date Initiated: 09/21/2020 Target Resolution Date: 03/16/2021 Goal Status: Active Interventions: Assess patient/caregiver ability to obtain necessary supplies Assess patient/caregiver ability to perform ulcer/skin care regimen upon admission and as needed Provide education on ulcer and skin care Treatment Activities: Skin care regimen initiated : 09/21/2020 Topical wound management initiated : 09/21/2020 Notes: Electronic Signature(s) Signed: 01/25/2021 4:52:26 PM By: Deon Pilling RN, BSN Entered By: Deon Pilling on 01/25/2021 13:55:24 -------------------------------------------------------------------------------- Pain Assessment Details Patient Name: Date of Service: [name] [name], [name] 01/25/2021 1:30 PM Medical Record Number: [id number] Patient Account Number: [id number] Date of Birth/Sex: Treating RN: 25-Jul-1926 (88 y.o. [name] [name] Primary Care  Tierre Netto: [name] Other Clinician: Referring Angelita Harnack: Treating Audie Stayer/Extender: Eli Hose in Treatment: 18 Active Problems Location of Pain Severity and Description of Pain Patient Has Paino No Site Locations Rate the pain. Current Pain Level: 0 Pain Management and Medication Current Pain Management: Medication: No Cold Application: No Rest: No Massage: No Activity: No T.E.N.S.: No Heat Application: No Leg drop or elevation: No Is the Current Pain Management Adequate: Adequate How does your wound impact your activities of daily livingo Sleep: No Bathing: No Appetite: No Relationship With Others: No Bladder Continence: No Emotions: No Bowel Continence: No Work: No Toileting: No Drive: No Dressing: No Hobbies: No Engineer, maintenance) Signed: 01/25/2021 4:52:26 PM By: Deon Pilling RN, BSN Entered By: Deon Pilling on 01/25/2021 13:35:45 -------------------------------------------------------------------------------- Patient/Caregiver Education Details Patient Name: Date of Service: [name] [name] ID I. 1/25/2023andnbsp1:30 PM Medical Record Number: [id number] Patient Account Number: [id number] Date of Birth/Gender: Treating RN: January 28, 1926 (88 y.o. [name] [name] Primary Care Physician: [name] Other Clinician: Referring Physician: Treating Physician/Extender: Eli Hose in Treatment: 18 Education Assessment Education Provided To: Patient and Caregiver Education Topics Provided Wound/Skin Impairment: Handouts: Skin Care Do's and Dont's Methods: Explain/Verbal Responses: Reinforcements needed Electronic Signature(s) Signed: 01/25/2021 4:52:26 PM By: Deon Pilling RN, BSN Entered By: Deon Pilling on 01/25/2021 13:55:40 -------------------------------------------------------------------------------- Wound Assessment Details Patient Name: Date of Service: [name] [name], [name] 01/25/2021  1:30 PM Medical Record Number: [id number] Patient Account Number: [id number] Date of Birth/Sex: Treating RN: October 31, 1926 (88 y.o. [name], [name] Primary Care Lynleigh Kovack: [name] Other Clinician: Referring Shalicia Craghead: Treating Rilee Knoll/Extender: Eli Hose in Treatment: 18 Wound Status Wound Number: 1 Primary Pressure Ulcer Etiology: Wound Location: Right, Lateral Calcaneus Wound Open Wounding Event: Pressure Injury Status: Date Acquired: 06/08/2020 Comorbid Cataracts, Sleep Apnea, Arrhythmia, Congestive Heart Failure, Weeks Of Treatment: 18 History: Coronary Artery Disease, Hypertension, Myocardial Infarction, Clustered Wound: No End Stage Renal Disease, Gout, Dementia, Neuropathy Photos Wound Measurements Length: (cm) 0.6 Width: (cm) 0.6 Depth: (cm) 0.1 Area: (cm) 0.283 Volume: (cm) 0.028 % Reduction in Area: 42.8% % Reduction in Volume: 42.9% Epithelialization: None Tunneling: No Undermining: No Wound Description Classification: Category/Stage III Wound Margin: Distinct, outline attached Exudate Amount: Small Exudate Type: Serosanguineous Exudate Color: red, brown Foul Odor After Cleansing: No Slough/Fibrino No Wound Bed Granulation Amount: None Present (0%) Exposed Structure Necrotic Amount: Large (67-100%) Fascia Exposed: No Necrotic Quality: Eschar, Adherent Slough Fat Layer (Subcutaneous Tissue) Exposed: Yes Tendon Exposed: No  Muscle Exposed: No Joint Exposed: No Bone Exposed: No Treatment Notes Wound #1 (Calcaneus) Wound Laterality: Right, Lateral Cleanser Soap and Water Discharge Instruction: May shower and wash wound with dial antibacterial soap and water prior to dressing change. Peri-Wound Care Topical Primary Dressing Iodosorb Gel 10 (gm) Tube Discharge Instruction: Apply to wound bed as instructed Secondary Dressing Woven Gauze Sponge, Non-Sterile 4x4 in Discharge Instruction: Apply over primary dressing as  directed. ALLEVYN Heel 4 1/2in x 5 1/2in / 10.5cm x 13.5cm Discharge Instruction: Apply over primary dressing as directed. Secured With [employer], 4.5x3.1 (in/yd) Discharge Instruction: Secure with Kerlix as directed. 41M Medipore H Soft Cloth Surgical T ape, 4 x 10 (in/yd) Discharge Instruction: Secure with tape as directed. Compression Wrap Compression Stockings Add-Ons Electronic Signature(s) Signed: 01/25/2021 4:52:26 PM By: Deon Pilling RN, BSN Entered By: Deon Pilling on 01/25/2021 13:56:06 -------------------------------------------------------------------------------- Wound Assessment Details Patient Name: Date of Service: [name] [name], [name] 01/25/2021 1:30 PM Medical Record Number: [id number] Patient Account Number: [id number] Date of Birth/Sex: Treating RN: Aug 16, 1926 (88 y.o. [name], [name] Primary Care Kensly Bowmer: [name] Other Clinician: Referring Santo Zahradnik: Treating Alayla Dethlefs/Extender: Eli Hose in Treatment: 18 Wound Status Wound Number: 2 Primary Pressure Ulcer Etiology: Wound Location: Right, Medial Calcaneus Wound Open Wounding Event: Pressure Injury Status: Date Acquired: 10/17/2020 Comorbid Cataracts, Sleep Apnea, Arrhythmia, Congestive Heart Failure, Weeks Of Treatment: 14 History: Coronary Artery Disease, Hypertension, Myocardial Infarction, Clustered Wound: No End Stage Renal Disease, Gout, Dementia, Neuropathy Photos Wound Measurements Length: (cm) 1.7 Width: (cm) 3.7 Depth: (cm) 0.2 Area: (cm) 4.94 Volume: (cm) 0.988 % Reduction in Area: -424.4% % Reduction in Volume: -951.1% Epithelialization: Small (1-33%) Tunneling: No Undermining: No Wound Description Classification: Category/Stage III Wound Margin: Distinct, outline attached Exudate Amount: Medium Exudate Type: Serosanguineous Exudate Color: red, brown Foul Odor After Cleansing: No Slough/Fibrino Yes Wound Bed Granulation Amount: Medium  (34-66%) Exposed Structure Granulation Quality: Red, Pink, Friable Fascia Exposed: No Necrotic Amount: Medium (34-66%) Fat Layer (Subcutaneous Tissue) Exposed: Yes Necrotic Quality: Adherent Slough Tendon Exposed: No Muscle Exposed: No Joint Exposed: No Bone Exposed: No Treatment Notes Wound #2 (Calcaneus) Wound Laterality: Right, Medial Cleanser Soap and Water Discharge Instruction: May shower and wash wound with dial antibacterial soap and water prior to dressing change. Peri-Wound Care Topical Primary Dressing Iodosorb Gel 10 (gm) Tube Discharge Instruction: Apply to wound bed as instructed Secondary Dressing Woven Gauze Sponge, Non-Sterile 4x4 in Discharge Instruction: Apply over primary dressing as directed. ALLEVYN Heel 4 1/2in x 5 1/2in / 10.5cm x 13.5cm Discharge Instruction: Apply over primary dressing as directed. Secured With [employer], 4.5x3.1 (in/yd) Discharge Instruction: Secure with Kerlix as directed. 41M Medipore H Soft Cloth Surgical T ape, 4 x 10 (in/yd) Discharge Instruction: Secure with tape as directed. Compression Wrap Compression Stockings Add-Ons Electronic Signature(s) Signed: 01/25/2021 4:52:26 PM By: Deon Pilling RN, BSN Entered By: Deon Pilling on 01/25/2021 13:56:43 -------------------------------------------------------------------------------- Wound Assessment Details Patient Name: Date of Service: [name] [name], [name] 01/25/2021 1:30 PM Medical Record Number: [id number] Patient Account Number: [id number] Date of Birth/Sex: Treating RN: 09-08-1926 (88 y.o. [name] [name] Primary Care Arika Mainer: [name] Other Clinician: Referring Omar Gayden: Treating Mahdi Frye/Extender: [name] in Treatment: 18 Wound Status Wound Number: 3 Primary Venous Leg Ulcer Etiology: Wound Location: Left, Lateral Ankle Wound Open Wounding Event: Gradually Appeared Status: Date Acquired: 10/12/2020 Comorbid Cataracts,  Sleep Apnea, Arrhythmia, Congestive Heart Failure, Weeks Of  Treatment: 10 History: Coronary Artery Disease, Hypertension, Myocardial Infarction, Clustered Wound: No End Stage Renal Disease, Gout, Dementia, Neuropathy Photos Wound Measurements Length: (cm) 1.9 Width: (cm) 1.3 Depth: (cm) 0.5 Area: (cm) 1.94 Volume: (cm) 0.97 % Reduction in Area: -285.7% % Reduction in Volume: -542.4% Epithelialization: Small (1-33%) Tunneling: No Undermining: No Wound Description Classification: Full Thickness Without Exposed Support Structures Wound Margin: Distinct, outline attached Exudate Amount: Medium Exudate Type: Serosanguineous Exudate Color: red, brown Foul Odor After Cleansing: No Slough/Fibrino Yes Wound Bed Granulation Amount: Small (1-33%) Exposed Structure Granulation Quality: Red Fascia Exposed: No Necrotic Amount: Large (67-100%) Fat Layer (Subcutaneous Tissue) Exposed: Yes Necrotic Quality: Adherent Slough Tendon Exposed: No Muscle Exposed: No Joint Exposed: No Bone Exposed: No Treatment Notes Wound #3 (Ankle) Wound Laterality: Left, Lateral Cleanser Soap and Water Discharge Instruction: May shower and wash wound with dial antibacterial soap and water prior to dressing change. Peri-Wound Care Topical Primary Dressing Iodosorb Gel 10 (gm) Tube Discharge Instruction: Apply to wound bed as instructed Secondary Dressing Zetuvit Plus Silicone Border Dressing 4x4 (in/in) Discharge Instruction: Apply silicone border over primary dressing as directed. Secured With Compression Wrap Compression Stockings Environmental education officer) Signed: 01/25/2021 4:52:26 PM By: Deon Pilling RN, BSN Entered By: Deon Pilling on 01/25/2021 13:57:17 -------------------------------------------------------------------------------- Wound Assessment Details Patient Name: Date of Service: [name] [name], [name] 01/25/2021 1:30 PM Medical Record Number: [id number] Patient Account Number:  [id number] Date of Birth/Sex: Treating RN: 05/08/1926 (88 y.o. [name], [name] Primary Care Kalicia Dufresne: [name] Other Clinician: Referring Yordin Rhoda: Treating Carlyne Keehan/Extender: Eli Hose in Treatment: 18 Wound Status Wound Number: 4 Primary Abscess Etiology: Wound Location: Left, Medial Lower Leg Wound Open Wounding Event: Other Lesion Status: Date Acquired: 11/16/2020 Comorbid Cataracts, Sleep Apnea, Arrhythmia, Congestive Heart Failure, Weeks Of Treatment: 10 Weeks Of Treatment: 10 History: Coronary Artery Disease, Hypertension, Myocardial Infarction, Clustered Wound: No End Stage Renal Disease, Gout, Dementia, Neuropathy Photos Wound Measurements Length: (cm) 1.2 Width: (cm) 1 Depth: (cm) 0.5 Area: (cm) 0.942 Volume: (cm) 0.471 % Reduction in Area: -380.6% % Reduction in Volume: -166.1% Epithelialization: Small (1-33%) Tunneling: No Undermining: No Wound Description Classification: Full Thickness Without Exposed Support Structures Wound Margin: Well defined, not attached Exudate Amount: Medium Exudate Type: Serosanguineous Exudate Color: red, brown Foul Odor After Cleansing: No Slough/Fibrino Yes Wound Bed Granulation Amount: Medium (34-66%) Exposed Structure Granulation Quality: Red, Pink Fascia Exposed: No Necrotic Amount: Medium (34-66%) Fat Layer (Subcutaneous Tissue) Exposed: Yes Necrotic Quality: Adherent Slough Tendon Exposed: No Muscle Exposed: No Joint Exposed: No Bone Exposed: No Treatment Notes Wound #4 (Lower Leg) Wound Laterality: Left, Medial Cleanser Soap and Water Discharge Instruction: May shower and wash wound with dial antibacterial soap and water prior to dressing change. Peri-Wound Care Topical Primary Dressing Iodosorb Gel 10 (gm) Tube Discharge Instruction: Apply to wound bed as instructed Secondary Dressing Zetuvit Plus Silicone Border Dressing 4x4 (in/in) Discharge Instruction: Apply silicone  border over primary dressing as directed. Secured With Compression Wrap Compression Stockings Environmental education officer) Signed: 01/25/2021 4:52:26 PM By: Deon Pilling RN, BSN Entered By: Deon Pilling on 01/25/2021 13:57:52 -------------------------------------------------------------------------------- Wound Assessment Details Patient Name: Date of Service: [name] [name], [name] 01/25/2021 1:30 PM Medical Record Number: [id number] Patient Account Number: [id number] Date of Birth/Sex: Treating RN: 1926-04-26 (88 y.o. [name] [name] Primary Care Ornella Coderre: [name] Other Clinician: Referring Quincie Haroon: Treating Kaydan Wong/Extender: [name] in Treatment: 18 Wound Status Wound Number: 0  Primary Arterial Insufficiency Ulcer Etiology: Wound Location: Right, Lateral Ankle Wound Open Wounding Event: Gradually Appeared Status: Date Acquired: 11/22/2020 Comorbid Cataracts, Sleep Apnea, Arrhythmia, Congestive Heart Failure, Weeks Of Treatment: 5 History: Coronary Artery Disease, Hypertension, Myocardial Infarction, Clustered Wound: No End Stage Renal Disease, Gout, Dementia, Neuropathy Photos Wound Measurements Length: (cm) 1.5 Width: (cm) 0.9 Depth: (cm) 0.3 Area: (cm) 1.06 Volume: (cm) 0.318 % Reduction in Area: -285.5% % Reduction in Volume: -132.1% Epithelialization: None Tunneling: No Undermining: No Wound Description Classification: Full Thickness Without Exposed Support Structures Wound Margin: Distinct, outline attached Exudate Amount: Medium Exudate Type: Serosanguineous Exudate Color: red, brown Foul Odor After Cleansing: No Slough/Fibrino Yes Wound Bed Granulation Amount: Small (1-33%) Exposed Structure Granulation Quality: Red, Pink Fascia Exposed: No Necrotic Amount: Large (67-100%) Fat Layer (Subcutaneous Tissue) Exposed: Yes Necrotic Quality: Adherent Slough Tendon Exposed: No Muscle Exposed: No Joint Exposed:  No Bone Exposed: No Treatment Notes Wound #6 (Ankle) Wound Laterality: Right, Lateral Cleanser Soap and Water Discharge Instruction: May shower and wash wound with dial antibacterial soap and water prior to dressing change. Peri-Wound Care Topical Primary Dressing Iodosorb Gel 10 (gm) Tube Discharge Instruction: Apply to wound bed as instructed Secondary Dressing Zetuvit Plus Silicone Border Dressing 4x4 (in/in) Discharge Instruction: Apply silicone border over primary dressing as directed. Secured With Compression Wrap Compression Stockings Environmental education officer) Signed: 01/25/2021 4:52:26 PM By: Deon Pilling RN, BSN Entered By: Deon Pilling on 01/25/2021 13:58:45 -------------------------------------------------------------------------------- Wound Assessment Details Patient Name: Date of Service: [name] [name], [name] 01/25/2021 1:30 PM Medical Record Number: [id number] Patient Account Number: [id number] Date of Birth/Sex: Treating RN: Apr 15, 1926 (88 y.o. [name] [name] Primary Care Halo Laski: [name] Other Clinician: Referring Koby Hartfield: Treating Beverley Allender/Extender: [name] in Treatment: 18 Wound Status Wound Number: 7 Primary Pressure Ulcer Etiology: Wound Location: Left, Lateral Calcaneus Wound Open Wounding Event: Trauma Status: Date Acquired: 01/11/2021 Comorbid Cataracts, Sleep Apnea, Arrhythmia, Congestive Heart Failure, Weeks Of Treatment: 0 History: Coronary Artery Disease, Hypertension, Myocardial Infarction, Clustered Wound: No End Stage Renal Disease, Gout, Dementia, Neuropathy Photos Wound Measurements Length: (cm) 0.5 Width: (cm) 0.3 Depth: (cm) 0.1 Area: (cm) 0.118 Volume: (cm) 0.012 % Reduction in Area: 0% % Reduction in Volume: 0% Epithelialization: None Tunneling: No Undermining: No Wound Description Classification: Unstageable/Unclassified Wound Margin: Distinct, outline attached Exudate  Amount: Medium Exudate Type: Serosanguineous Exudate Color: red, brown Foul Odor After Cleansing: No Slough/Fibrino Yes Wound Bed Granulation Amount: None Present (0%) Exposed Structure Necrotic Amount: Large (67-100%) Fascia Exposed: No Necrotic Quality: Adherent Slough Fat Layer (Subcutaneous Tissue) Exposed: No Tendon Exposed: No Muscle Exposed: No Joint Exposed: No Bone Exposed: No Assessment Notes callous noted to periwound as well. Treatment Notes Wound #7 (Calcaneus) Wound Laterality: Left, Lateral Cleanser Soap and Water Discharge Instruction: May shower and wash wound with dial antibacterial soap and water prior to dressing change. Peri-Wound Care Topical Primary Dressing Iodosorb Gel 10 (gm) Tube Discharge Instruction: Apply to wound bed as instructed Secondary Dressing Woven Gauze Sponge, Non-Sterile 4x4 in Discharge Instruction: Apply over primary dressing as directed. ALLEVYN Heel 4 1/2in x 5 1/2in / 10.5cm x 13.5cm Discharge Instruction: Apply over primary dressing as directed. Secured With [employer], 4.5x3.1 (in/yd) Discharge Instruction: Secure with Kerlix as directed. 71M Medipore H Soft Cloth Surgical T ape, 4 x 10 (in/yd) Discharge Instruction: Secure with tape as directed. Compression Wrap Compression Stockings Add-Ons Electronic Signature(s) Signed: 01/25/2021 4:52:26 PM By: Deon Pilling RN, BSN Entered By:  Deon Pilling on 01/25/2021 13:59:33 -------------------------------------------------------------------------------- Vitals Details Patient Name: Date of Service: [name] [name] ID I. 01/25/2021 1:30 PM Medical Record Number: [id number] Patient Account Number: [id number] Date of Birth/Sex: Treating RN: 05/18/1926 (88 y.o. [name] [name] Primary Care Waunita Sandstrom: [name] Other Clinician: Referring Aliciana Ricciardi: Treating Janila Arrazola/Extender: Eli Hose in Treatment: 18 Vital Signs Time Taken:  13:30 Temperature (F): 97.8 Height (in): 72 Pulse (bpm): 65 Weight (lbs): 223 Respiratory Rate (breaths/min): 20 Body Mass Index (BMI): 30.2 Blood Pressure (mmHg): 130/70 Reference Range: 80 - 120 mg / dl Electronic Signature(s) Signed: 01/25/2021 4:52:26 PM By: Deon Pilling RN, BSN Entered By: Deon Pilling on 01/25/2021 13:35:34

## 2021-01-26 DIAGNOSIS — L89610 Pressure ulcer of right heel, unstageable: Secondary | ICD-10-CM | POA: Diagnosis not present

## 2021-01-30 ENCOUNTER — Other Ambulatory Visit (HOSPITAL_COMMUNITY): Payer: Self-pay | Admitting: *Deleted

## 2021-01-31 ENCOUNTER — Ambulatory Visit (HOSPITAL_COMMUNITY)
Admission: RE | Admit: 2021-01-31 | Discharge: 2021-01-31 | Disposition: A | Discharge status: Home / Self Care | Payer: Medicare Other | Source: Ambulatory Visit | Attending: Internal Medicine | Admitting: Internal Medicine

## 2021-01-31 ENCOUNTER — Other Ambulatory Visit: Payer: Self-pay

## 2021-01-31 DIAGNOSIS — D649 Anemia, unspecified: Secondary | ICD-10-CM | POA: Diagnosis not present

## 2021-01-31 LAB — PREPARE RBC (CROSSMATCH)

## 2021-01-31 MED ORDER — ACETAMINOPHEN 325 MG PO TABS
ORAL_TABLET | ORAL | Status: AC
  Filled 2021-01-31: qty 2

## 2021-01-31 MED ORDER — FUROSEMIDE 10 MG/ML IJ SOLN
INTRAMUSCULAR | Status: AC
  Administered 2021-01-31: 40 mg via INTRAVENOUS
  Filled 2021-01-31: qty 4

## 2021-01-31 MED ORDER — FUROSEMIDE 10 MG/ML IJ SOLN: 40.0000 mg | Freq: Once | INTRAMUSCULAR | Status: AC

## 2021-01-31 MED ORDER — POTASSIUM CHLORIDE CRYS ER 20 MEQ PO TBCR
10.0000 meq | EXTENDED_RELEASE_TABLET | Freq: Once | ORAL | Status: AC
  Administered 2021-01-31: 10 meq via ORAL

## 2021-01-31 MED ORDER — POTASSIUM CHLORIDE CRYS ER 20 MEQ PO TBCR
EXTENDED_RELEASE_TABLET | ORAL | Status: AC
  Filled 2021-01-31: qty 1

## 2021-01-31 MED ORDER — ACETAMINOPHEN 325 MG PO TABS
650.0000 mg | ORAL_TABLET | ORAL | Status: AC
  Administered 2021-01-31 (×2): 650 mg via ORAL

## 2021-01-31 MED ORDER — SODIUM CHLORIDE 0.9% IV SOLUTION: Freq: Once | INTRAVENOUS | Status: DC

## 2021-02-01 ENCOUNTER — Other Ambulatory Visit: Payer: Self-pay

## 2021-02-01 ENCOUNTER — Encounter (HOSPITAL_BASED_OUTPATIENT_CLINIC_OR_DEPARTMENT_OTHER): Payer: Medicare Other | Attending: Physician Assistant | Admitting: Physician Assistant

## 2021-02-01 DIAGNOSIS — I129 Hypertensive chronic kidney disease with stage 1 through stage 4 chronic kidney disease, or unspecified chronic kidney disease: Secondary | ICD-10-CM | POA: Diagnosis not present

## 2021-02-01 DIAGNOSIS — I872 Venous insufficiency (chronic) (peripheral): Secondary | ICD-10-CM | POA: Insufficient documentation

## 2021-02-01 DIAGNOSIS — L97312 Non-pressure chronic ulcer of right ankle with fat layer exposed: Secondary | ICD-10-CM | POA: Insufficient documentation

## 2021-02-01 DIAGNOSIS — N183 Chronic kidney disease, stage 3 unspecified: Secondary | ICD-10-CM | POA: Insufficient documentation

## 2021-02-01 DIAGNOSIS — N401 Enlarged prostate with lower urinary tract symptoms: Secondary | ICD-10-CM | POA: Insufficient documentation

## 2021-02-01 DIAGNOSIS — L97322 Non-pressure chronic ulcer of left ankle with fat layer exposed: Secondary | ICD-10-CM | POA: Diagnosis not present

## 2021-02-01 DIAGNOSIS — L89613 Pressure ulcer of right heel, stage 3: Secondary | ICD-10-CM | POA: Insufficient documentation

## 2021-02-01 DIAGNOSIS — L97822 Non-pressure chronic ulcer of other part of left lower leg with fat layer exposed: Secondary | ICD-10-CM | POA: Insufficient documentation

## 2021-02-01 DIAGNOSIS — I7389 Other specified peripheral vascular diseases: Secondary | ICD-10-CM | POA: Insufficient documentation

## 2021-02-01 LAB — BPAM RBC
Blood Product Expiration Date: 202302252359
Blood Product Expiration Date: 202302252359
ISSUE DATE / TIME: 202301310930
ISSUE DATE / TIME: 202301311203
Unit Type and Rh: 5100
Unit Type and Rh: 5100

## 2021-02-01 LAB — TYPE AND SCREEN
ABO/RH(D): O POS
Antibody Screen: NEGATIVE
Unit division: 0
Unit division: 0

## 2021-02-01 NOTE — Progress Notes (Signed)
DRAIDEN, MIRSKY I. ([id number]) Visit Report for 02/01/2021 Arrival Information Details Patient Name: Date of Service: [name] ID I. 02/01/2021 2:30 PM Medical Record Number: [id number] Patient Account Number: [id number] Date of Birth/Sex: Treating RN: Jun 27, 1926 (88 y.o. [name] Primary Care : [name] Other Clinician: Referring : Treating /Extender: Eli Hose in Treatment: 78 Visit Information History Since Last Visit Added or deleted any medications: No Patient Arrived: Wheel Chair Any new allergies or adverse reactions: No Arrival Time: 14:34 Had a fall or experienced change in No Accompanied By: spouse activities of daily living that may affect Transfer Assistance: None risk of falls: Patient Identification Verified: Yes Signs or symptoms of abuse/neglect since last visito No Secondary Verification Process Completed: Yes Hospitalized since last visit: No Patient Requires Transmission-Based Precautions: No Implantable device outside of the clinic excluding No Patient Has Alerts: Yes cellular tissue based products placed in the center Patient Alerts: Patient on Blood Thinner since last visit: Has Dressing in Place as Prescribed: Yes Pain Present Now: Yes Electronic Signature(s) Signed: 02/01/2021 5:21:41 PM By: Baruch Gouty RN, BSN Entered By: Baruch Gouty on 02/01/2021 14:36:46 -------------------------------------------------------------------------------- Clinic Level of Care Assessment Details Patient Name: Date of Service: [name], [name] 02/01/2021 2:30 PM Medical Record Number: [id number] Patient Account Number: [id number] Date of Birth/Sex: Treating RN: 04/03/1926 (88 y.o. [name] Primary Care : [name] Other Clinician: Referring : Treating /Extender: Eli Hose in Treatment: 19 Clinic Level of Care Assessment Items TOOL 4  Quantity Score X- 1 0 Use when only an EandM is performed on FOLLOW-UP visit ASSESSMENTS - Nursing Assessment / Reassessment X- 1 10 Reassessment of Co-morbidities (includes updates in patient status) X- 1 5 Reassessment of Adherence to Treatment Plan ASSESSMENTS - Wound and Skin A ssessment / Reassessment [] - 0 Simple Wound Assessment / Reassessment - one wound X- 6 5 Complex Wound Assessment / Reassessment - multiple wounds X- 1 10 Dermatologic / Skin Assessment (not related to wound area) ASSESSMENTS - Focused Assessment X- 2 5 Circumferential Edema Measurements - multi extremities X- 1 10 Nutritional Assessment / Counseling / Intervention [] - 0 Lower Extremity Assessment (monofilament, tuning fork, pulses) [] - 0 Peripheral Arterial Disease Assessment (using hand held doppler) ASSESSMENTS - Ostomy and/or Continence Assessment and Care [] - 0 Incontinence Assessment and Management [] - 0 Ostomy Care Assessment and Management (repouching, etc.) PROCESS - Coordination of Care [] - 0 Simple Patient / Family Education for ongoing care X- 1 20 Complex (extensive) Patient / Family Education for ongoing care X- 1 10 Staff obtains Programmer, systems, Records, T Results / Process Orders est [] - 0 Staff telephones HHA, Nursing Homes / Clarify orders / etc [] - 0 Routine Transfer to another Facility (non-emergent condition) [] - 0 Routine Hospital Admission (non-emergent condition) [] - 0 New Admissions / Biomedical engineer / Ordering NPWT Apligraf, etc. , [] - 0 Emergency Hospital Admission (emergent condition) [] - 0 Simple Discharge Coordination X- 1 15 Complex (extensive) Discharge Coordination PROCESS - Special Needs [] - 0 Pediatric / Minor Patient Management [] - 0 Isolation Patient Management [] - 0 Hearing / Language / Visual special needs [] - 0 Assessment of Community assistance (transportation, D/C planning, etc.) [] - 0 Additional assistance / Altered  mentation [] - 0 Support Surface(s) Assessment (bed, cushion, seat, etc.) INTERVENTIONS - Wound Cleansing / Measurement [] - 0 Simple  Wound Cleansing - one wound X- 6 5 Complex Wound Cleansing - multiple wounds X- 1 5 Wound Imaging (photographs - any number of wounds) [] - 0 Wound Tracing (instead of photographs) [] - 0 Simple Wound Measurement - one wound X- 6 5 Complex Wound Measurement - multiple wounds INTERVENTIONS - Wound Dressings X - Small Wound Dressing one or multiple wounds 1 10 X- 2 15 Medium Wound Dressing one or multiple wounds [] - 0 Large Wound Dressing one or multiple wounds X- 1 5 Application of Medications - topical [] - 0 Application of Medications - injection INTERVENTIONS - Miscellaneous [] - 0 External ear exam [] - 0 Specimen Collection (cultures, biopsies, blood, body fluids, etc.) [] - 0 Specimen(s) / Culture(s) sent or taken to Lab for analysis [] - 0 Patient Transfer (multiple staff / Civil Service fast streamer / Similar devices) [] - 0 Simple Staple / Suture removal (25 or less) [] - 0 Complex Staple / Suture removal (26 or more) [] - 0 Hypo / Hyperglycemic Management (close monitor of Blood Glucose) [] - 0 Ankle / Brachial Index (ABI) - do not check if billed separately X- 1 5 Vital Signs Has the patient been seen at the hospital within the last three years: Yes Total Score: 235 Level Of Care: New/Established - Level 5 Electronic Signature(s) Signed: 02/01/2021 5:26:07 PM By: Deon Pilling RN, BSN Entered By: Deon Pilling on 02/01/2021 15:17:25 -------------------------------------------------------------------------------- Encounter Discharge Information Details Patient Name: Date of Service: [name], [name] 02/01/2021 2:30 PM Medical Record Number: [id number] Patient Account Number: [id number] Date of Birth/Sex: Treating RN: Dec 23, 1926 (88 y.o. [name] Primary Care : [name] Other Clinician: Referring : Treating  /Extender: Eli Hose in Treatment: 25 Encounter Discharge Information Items Discharge Condition: Stable Ambulatory Status: Wheelchair Discharge Destination: Home Transportation: Private Auto Accompanied By: wife Schedule Follow-up Appointment: Yes Clinical Summary of Care: Electronic Signature(s) Signed: 02/01/2021 5:26:07 PM By: Deon Pilling RN, BSN Entered By: Deon Pilling on 02/01/2021 15:18:44 -------------------------------------------------------------------------------- Lower Extremity Assessment Details Patient Name: Date of Service: [name], [name] 02/01/2021 2:30 PM Medical Record Number: [id number] Patient Account Number: [id number] Date of Birth/Sex: Treating RN: Jan 23, 1926 (88 y.o. [name] Primary Care : [name] Other Clinician: Referring : Treating /Extender: [name] in Treatment: 19 Edema Assessment Assessed: [Left: No] [Right: No] Edema: [Left: Yes] [Right: Yes] Calf Left: Right: Point of Measurement: 34 cm From Medial Instep 38.4 cm 41 cm Ankle Left: Right: Point of Measurement: 11 cm From Medial Instep 26 cm 30 cm Vascular Assessment Pulses: Dorsalis Pedis Palpable: [Left:Yes] [Right:Yes] Electronic Signature(s) Signed: 02/01/2021 5:21:41 PM By: Baruch Gouty RN, BSN Entered By: Baruch Gouty on 02/01/2021 14:38:11 -------------------------------------------------------------------------------- Multi-Disciplinary Care Plan Details Patient Name: Date of Service: [name], [name] 02/01/2021 2:30 PM Medical Record Number: [id number] Patient Account Number: [id number] Date of Birth/Sex: Treating RN: June 16, 1926 (88 y.o. [name] Primary Care : [name] Other Clinician: Referring : Treating /Extender: Eli Hose in Treatment: 85 Active Inactive Tissue Oxygenation Nursing  Diagnoses: Potential alteration in peripheral tissue perfusion (select prior to confirmation of diagnosis) Goals: Non-invasive arterial studies are completed as ordered Date Initiated: 09/21/2020 Date Inactivated: 10/19/2020 Target Resolution Date: 09/21/2020 Goal Status: Met Revascularization procedures completed as ordered Date Initiated: 09/21/2020 Target Resolution Date: 03/24/2021 Goal Status: Active Interventions: Assess patient understanding of disease process  and management upon diagnosis and as needed Provide education on tissue oxygenation and ischemia Treatment Activities: Ankle Brachial Index (ABI) : 09/21/2020 Non-invasive vascular studies : 09/21/2020 T ordered outside of clinic : 09/21/2020 est Notes: 10/19/20: Had vascular testing, awaiting appointment to see vascular MD Wound/Skin Impairment Nursing Diagnoses: Knowledge deficit related to ulceration/compromised skin integrity Goals: Patient/caregiver will verbalize understanding of skin care regimen Date Initiated: 09/21/2020 Target Resolution Date: 03/16/2021 Goal Status: Active Interventions: Assess patient/caregiver ability to obtain necessary supplies Assess patient/caregiver ability to perform ulcer/skin care regimen upon admission and as needed Provide education on ulcer and skin care Treatment Activities: Skin care regimen initiated : 09/21/2020 Topical wound management initiated : 09/21/2020 Notes: Electronic Signature(s) Signed: 02/01/2021 5:26:07 PM By: Deon Pilling RN, BSN Entered By: Deon Pilling on 02/01/2021 14:59:02 -------------------------------------------------------------------------------- Pain Assessment Details Patient Name: Date of Service: [name], [name] 02/01/2021 2:30 PM Medical Record Number: [id number] Patient Account Number: [id number] Date of Birth/Sex: Treating RN: 05/31/1926 (88 y.o. [name] Primary Care : [name] Other Clinician: Referring  : Treating /Extender: Eli Hose in Treatment: 24 Active Problems Location of Pain Severity and Description of Pain Patient Has Paino Yes Site Locations Pain Location: Pain in Ulcers With Dressing Change: Yes Duration of the Pain. Constant / Intermittento Intermittent Rate the pain. Current Pain Level: 2 Worst Pain Level: 5 Least Pain Level: 0 Character of Pain Describe the Pain: Aching Pain Management and Medication Current Pain Management: Medication: Yes Is the Current Pain Management Adequate: Adequate How does your wound impact your activities of daily livingo Sleep: No Bathing: No Appetite: No Relationship With Others: No Bladder Continence: No Emotions: No Bowel Continence: No Work: No Toileting: No Drive: No Dressing: No Hobbies: No Electronic Signature(s) Signed: 02/01/2021 5:21:41 PM By: Baruch Gouty RN, BSN Entered By: Baruch Gouty on 02/01/2021 14:39:02 -------------------------------------------------------------------------------- Patient/Caregiver Education Details Patient Name: Date of Service: [name] ID I. 2/1/2023andnbsp2:30 PM Medical Record Number: [id number] Patient Account Number: [id number] Date of Birth/Gender: Treating RN: November 07, 1926 (88 y.o. [name] Primary Care Physician: [name] Other Clinician: Referring Physician: Treating Physician/Extender: Eli Hose in Treatment: 10 Education Assessment Education Provided To: Patient Education Topics Provided Wound/Skin Impairment: Handouts: Skin Care Do's and Dont's Methods: Explain/Verbal Responses: Reinforcements needed Electronic Signature(s) Signed: 02/01/2021 5:26:07 PM By: Deon Pilling RN, BSN Entered By: Deon Pilling on 02/01/2021 14:59:21 -------------------------------------------------------------------------------- Wound Assessment Details Patient Name: Date of Service: [name], DA  V ID I. 02/01/2021 2:30 PM Medical Record Number: [id number] Patient Account Number: [id number] Date of Birth/Sex: Treating RN: Aug 28, 1926 (88 y.o. [name], [name] Primary Care : [name] Other Clinician: Referring : Treating /Extender: Eli Hose in Treatment: 19 Wound Status Wound Number: 1 Primary Pressure Ulcer Etiology: Wound Location: Right, Lateral Calcaneus Wound Open Wounding Event: Pressure Injury Status: Date Acquired: 06/08/2020 Comorbid Cataracts, Sleep Apnea, Arrhythmia, Congestive Heart Failure, Weeks Of Treatment: 19 History: Coronary Artery Disease, Hypertension, Myocardial Infarction, Clustered Wound: No End Stage Renal Disease, Gout, Dementia, Neuropathy Photos Wound Measurements Length: (cm) 2 Width: (cm) 2.5 Depth: (cm) 0.2 Area: (cm) 3.927 Volume: (cm) 0.785 % Reduction in Area: -693.3% % Reduction in Volume: -1502% Epithelialization: None Tunneling: No Undermining: No Wound Description Classification: Category/Stage III Wound Margin: Distinct, outline attached Exudate Amount: Medium Exudate Type: Serosanguineous Exudate Color: red, brown Foul Odor After Cleansing: No Slough/Fibrino Yes Wound Bed Granulation Amount:  Medium (34-66%) Exposed Structure Granulation Quality: Pink, Pale Fascia Exposed: No Necrotic Amount: Medium (34-66%) Fat Layer (Subcutaneous Tissue) Exposed: Yes Necrotic Quality: Adherent Slough Tendon Exposed: No Muscle Exposed: No Joint Exposed: No Bone Exposed: No Assessment Notes drainage and old blood noted under periwound skin. Treatment Notes Wound #1 (Calcaneus) Wound Laterality: Right, Lateral Cleanser Soap and Water Discharge Instruction: May shower and wash wound with dial antibacterial soap and water prior to dressing change. Peri-Wound Care Topical Primary Dressing Iodosorb Gel 10 (gm) Tube Discharge Instruction: Apply to wound bed as  instructed Secondary Dressing Woven Gauze Sponge, Non-Sterile 4x4 in Discharge Instruction: Apply over primary dressing as directed. ALLEVYN Heel 4 1/2in x 5 1/2in / 10.5cm x 13.5cm Discharge Instruction: Apply over primary dressing as directed. Secured With [employer], 4.5x3.1 (in/yd) Discharge Instruction: Secure with Kerlix as directed. 67M Medipore H Soft Cloth Surgical T ape, 4 x 10 (in/yd) Discharge Instruction: Secure with tape as directed. Compression Wrap Compression Stockings Add-Ons Notes heel cup to left heel for protection. Electronic Signature(s) Signed: 02/01/2021 5:26:07 PM By: Deon Pilling RN, BSN Entered By: Deon Pilling on 02/01/2021 14:51:12 -------------------------------------------------------------------------------- Wound Assessment Details Patient Name: Date of Service: [name], [name] 02/01/2021 2:30 PM Medical Record Number: [id number] Patient Account Number: [id number] Date of Birth/Sex: Treating RN: 1926-09-11 (88 y.o. [name], [name] Primary Care Kamauri Kathol: [name] Other Clinician: Referring Toua Stites: Treating Dyron Kawano/Extender: Eli Hose in Treatment: 19 Wound Status Wound Number: 2 Primary Pressure Ulcer Etiology: Wound Location: Right, Medial Calcaneus Wound Open Wounding Event: Pressure Injury Status: Date Acquired: 10/17/2020 Comorbid Cataracts, Sleep Apnea, Arrhythmia, Congestive Heart Failure, Weeks Of Treatment: 15 History: Coronary Artery Disease, Hypertension, Myocardial Infarction, Clustered Wound: No End Stage Renal Disease, Gout, Dementia, Neuropathy Photos Wound Measurements Length: (cm) 1.4 Width: (cm) 3.5 Depth: (cm) 0.2 Area: (cm) 3.848 Volume: (cm) 0.77 % Reduction in Area: -308.5% % Reduction in Volume: -719.1% Epithelialization: Small (1-33%) Tunneling: No Undermining: No Wound Description Classification: Category/Stage III Wound Margin: Distinct, outline  attached Exudate Amount: Medium Exudate Type: Serosanguineous Exudate Color: red, brown Foul Odor After Cleansing: No Slough/Fibrino Yes Wound Bed Granulation Amount: Large (67-100%) Exposed Structure Granulation Quality: Red, Pink, Friable Fascia Exposed: No Necrotic Amount: Small (1-33%) Fat Layer (Subcutaneous Tissue) Exposed: Yes Necrotic Quality: Adherent Slough Tendon Exposed: No Muscle Exposed: No Joint Exposed: No Bone Exposed: No Treatment Notes Wound #2 (Calcaneus) Wound Laterality: Right, Medial Cleanser Soap and Water Discharge Instruction: May shower and wash wound with dial antibacterial soap and water prior to dressing change. Peri-Wound Care Topical Primary Dressing Iodosorb Gel 10 (gm) Tube Discharge Instruction: Apply to wound bed as instructed Secondary Dressing Woven Gauze Sponge, Non-Sterile 4x4 in Discharge Instruction: Apply over primary dressing as directed. ALLEVYN Heel 4 1/2in x 5 1/2in / 10.5cm x 13.5cm Discharge Instruction: Apply over primary dressing as directed. Secured With [employer], 4.5x3.1 (in/yd) Discharge Instruction: Secure with Kerlix as directed. 67M Medipore H Soft Cloth Surgical T ape, 4 x 10 (in/yd) Discharge Instruction: Secure with tape as directed. Compression Wrap Compression Stockings Add-Ons Notes heel cup to left heel for protection. Electronic Signature(s) Signed: 02/01/2021 5:26:07 PM By: Deon Pilling RN, BSN Entered By: Deon Pilling on 02/01/2021 14:55:36 -------------------------------------------------------------------------------- Wound Assessment Details Patient Name: Date of Service: [name], [name] 02/01/2021 2:30 PM Medical Record Number: [id number] Patient Account Number: [id number] Date of Birth/Sex: Treating RN: 1926/03/24 (88 y.o. [name] Primary Care Suzie Vandam: Joylene Draft,  Mark A Other Clinician: Referring : Treating /Extender: [name] in  Treatment: 19 Wound Status Wound Number: 3 Primary Venous Leg Ulcer Etiology: Wound Location: Left, Lateral Ankle Wound Open Wounding Event: Gradually Appeared Status: Date Acquired: 10/12/2020 Comorbid Cataracts, Sleep Apnea, Arrhythmia, Congestive Heart Failure, Weeks Of Treatment: 11 History: Coronary Artery Disease, Hypertension, Myocardial Infarction, Clustered Wound: No End Stage Renal Disease, Gout, Dementia, Neuropathy Photos Wound Measurements Length: (cm) 2.2 Width: (cm) 1.3 Depth: (cm) 0.6 Area: (cm) 2.246 Volume: (cm) 1.348 % Reduction in Area: -346.5% % Reduction in Volume: -792.7% Epithelialization: Small (1-33%) Tunneling: No Undermining: No Wound Description Classification: Full Thickness Without Exposed Support Structures Wound Margin: Distinct, outline attached Exudate Amount: Medium Exudate Type: Serosanguineous Exudate Color: red, brown Foul Odor After Cleansing: No Slough/Fibrino Yes Wound Bed Granulation Amount: Small (1-33%) Exposed Structure Granulation Quality: Red Fascia Exposed: No Necrotic Amount: Large (67-100%) Fat Layer (Subcutaneous Tissue) Exposed: Yes Necrotic Quality: Adherent Slough Tendon Exposed: No Muscle Exposed: No Joint Exposed: No Bone Exposed: No Treatment Notes Wound #3 (Ankle) Wound Laterality: Left, Lateral Cleanser Soap and Water Discharge Instruction: May shower and wash wound with dial antibacterial soap and water prior to dressing change. Peri-Wound Care Topical Primary Dressing Iodosorb Gel 10 (gm) Tube Discharge Instruction: Apply to wound bed as instructed Secondary Dressing Zetuvit Plus Silicone Border Dressing 4x4 (in/in) Discharge Instruction: Apply silicone border over primary dressing as directed. Secured With Compression Wrap Compression Stockings Add-Ons Notes heel cup to left heel for protection. Electronic Signature(s) Signed: 02/01/2021 5:26:07 PM By: Deon Pilling RN, BSN Entered By:  Deon Pilling on 02/01/2021 14:56:19 -------------------------------------------------------------------------------- Wound Assessment Details Patient Name: Date of Service: [name], [name] 02/01/2021 2:30 PM Medical Record Number: [id number] Patient Account Number: [id number] Date of Birth/Sex: Treating RN: October 06, 1926 (88 y.o. [name], [name] Primary Care : [name] Other Clinician: Referring : Treating /Extender: [name] in Treatment: 19 Wound Status Wound Number: 4 Primary Abscess Etiology: Wound Location: Left, Medial Lower Leg Wound Open Wounding Event: Other Lesion Status: Date Acquired: 11/16/2020 Comorbid Cataracts, Sleep Apnea, Arrhythmia, Congestive Heart Failure, Weeks Of Treatment: 11 History: Coronary Artery Disease, Hypertension, Myocardial Infarction, Clustered Wound: No End Stage Renal Disease, Gout, Dementia, Neuropathy Photos Wound Measurements Length: (cm) 1.1 Width: (cm) 0.9 Depth: (cm) 0.6 Area: (cm) 0.778 Volume: (cm) 0.467 % Reduction in Area: -296.9% % Reduction in Volume: -163.8% Epithelialization: Small (1-33%) Tunneling: No Undermining: No Wound Description Classification: Full Thickness Without Exposed Support Structures Wound Margin: Well defined, not attached Exudate Amount: Medium Exudate Type: Serosanguineous Exudate Color: red, brown Foul Odor After Cleansing: No Slough/Fibrino Yes Wound Bed Granulation Amount: Large (67-100%) Exposed Structure Granulation Quality: Red, Pink Fascia Exposed: No Necrotic Amount: Small (1-33%) Fat Layer (Subcutaneous Tissue) Exposed: Yes Necrotic Quality: Adherent Slough Tendon Exposed: No Muscle Exposed: No Joint Exposed: No Bone Exposed: No Treatment Notes Wound #4 (Lower Leg) Wound Laterality: Left, Medial Cleanser Soap and Water Discharge Instruction: May shower and wash wound with dial antibacterial soap and water prior to  dressing change. Peri-Wound Care Topical Primary Dressing Iodosorb Gel 10 (gm) Tube Discharge Instruction: Apply to wound bed as instructed Secondary Dressing Zetuvit Plus Silicone Border Dressing 4x4 (in/in) Discharge Instruction: Apply silicone border over primary dressing as directed. Secured With Compression Wrap Compression Stockings Add-Ons Notes heel cup to left heel for protection. Electronic Signature(s) Signed: 02/01/2021 5:26:07 PM By: Deon Pilling RN, BSN Entered By: Deon Pilling on  02/01/2021 14:57:09 -------------------------------------------------------------------------------- Wound Assessment Details Patient Name: Date of Service: [name] ID I. 02/01/2021 2:30 PM Medical Record Number: [id number] Patient Account Number: [id number] Date of Birth/Sex: Treating RN: September 08, 1926 (88 y.o. [name], [name] Primary Care Chelisa Hennen: [name] Other Clinician: Referring Lilliann Rossetti: Treating Ashantee Deupree/Extender: Eli Hose in Treatment: 19 Wound Status Wound Number: 6 Primary Arterial Insufficiency Ulcer Etiology: Wound Location: Right, Lateral Ankle Wound Open Wounding Event: Gradually Appeared Status: Date Acquired: 11/22/2020 Comorbid Cataracts, Sleep Apnea, Arrhythmia, Congestive Heart Failure, Weeks Of Treatment: 6 History: Coronary Artery Disease, Hypertension, Myocardial Infarction, Clustered Wound: No End Stage Renal Disease, Gout, Dementia, Neuropathy Photos Wound Measurements Length: (cm) 1.3 Width: (cm) 0.9 Depth: (cm) 0.4 Area: (cm) 0.919 Volume: (cm) 0.368 % Reduction in Area: -234.2% % Reduction in Volume: -168.6% Epithelialization: None Tunneling: No Undermining: No Wound Description Classification: Full Thickness Without Exposed Support Structures Wound Margin: Distinct, outline attached Exudate Amount: Medium Exudate Type: Serosanguineous Exudate Color: red, brown Foul Odor After Cleansing:  No Slough/Fibrino Yes Wound Bed Granulation Amount: Small (1-33%) Exposed Structure Granulation Quality: Red, Pink Fascia Exposed: No Necrotic Amount: Large (67-100%) Fat Layer (Subcutaneous Tissue) Exposed: Yes Necrotic Quality: Adherent Slough Tendon Exposed: No Muscle Exposed: No Joint Exposed: No Bone Exposed: No Treatment Notes Wound #6 (Ankle) Wound Laterality: Right, Lateral Cleanser Soap and Water Discharge Instruction: May shower and wash wound with dial antibacterial soap and water prior to dressing change. Peri-Wound Care Topical Primary Dressing Iodosorb Gel 10 (gm) Tube Discharge Instruction: Apply to wound bed as instructed Secondary Dressing Zetuvit Plus Silicone Border Dressing 4x4 (in/in) Discharge Instruction: Apply silicone border over primary dressing as directed. Secured With Compression Wrap Compression Stockings Add-Ons Notes heel cup to left heel for protection. Electronic Signature(s) Signed: 02/01/2021 5:26:07 PM By: Deon Pilling RN, BSN Signed: 02/01/2021 5:26:07 PM By: Deon Pilling RN, BSN Entered By: Deon Pilling on 02/01/2021 14:58:06 -------------------------------------------------------------------------------- Wound Assessment Details Patient Name: Date of Service: [name], [name] 02/01/2021 2:30 PM Medical Record Number: [id number] Patient Account Number: [id number] Date of Birth/Sex: Treating RN: 1926/01/12 (88 y.o. [name], [name] Primary Care Alexsandria Kivett: [name] Other Clinician: Referring Kameren Pargas: Treating Mckay Brandt/Extender: Eli Hose in Treatment: 19 Wound Status Wound Number: 7 Primary Pressure Ulcer Etiology: Wound Location: Left, Lateral Calcaneus Wound Open Wounding Event: Trauma Status: Date Acquired: 01/11/2021 Comorbid Cataracts, Sleep Apnea, Arrhythmia, Congestive Heart Failure, Weeks Of Treatment: 1 History: Coronary Artery Disease, Hypertension, Myocardial Infarction, Clustered  Wound: No End Stage Renal Disease, Gout, Dementia, Neuropathy Photos Wound Measurements Length: (cm) Width: (cm) Depth: (cm) Area: (cm) Volume: (cm) 0 % Reduction in Area: 100% 0 % Reduction in Volume: 100% 0 Epithelialization: Large (67-100%) 0 Tunneling: No 0 Undermining: No Wound Description Classification: Unstageable/Unclassified Wound Margin: Distinct, outline attached Exudate Amount: None Present Foul Odor After Cleansing: No Slough/Fibrino No Wound Bed Granulation Amount: None Present (0%) Exposed Structure Necrotic Amount: None Present (0%) Fascia Exposed: No Fat Layer (Subcutaneous Tissue) Exposed: No Tendon Exposed: No Muscle Exposed: No Joint Exposed: No Bone Exposed: No Electronic Signature(s) Signed: 02/01/2021 5:26:07 PM By: Deon Pilling RN, BSN Entered By: Deon Pilling on 02/01/2021 14:58:47 -------------------------------------------------------------------------------- [place] Details Patient Name: Date of Service: [name], [name] 02/01/2021 2:30 PM Medical Record Number: [id number] Patient Account Number: [id number] Date of Birth/Sex: Treating RN: December 16, 1926 (88 y.o. [name] Primary Care Keslie Gritz: [name] Other Clinician: Referring Beaux Wedemeyer: Treating Danette Weinfeld/Extender: Joaquim Lai  III, Beckey Rutter, Mark A Weeks in Treatment: 19 Vital Signs Time Taken: 14:36 Temperature (F): 97.7 Height (in): 72 Pulse (bpm): 66 Source: Stated Respiratory Rate (breaths/min): 18 Weight (lbs): 223 Blood Pressure (mmHg): 107/65 Source: Stated Reference Range: 80 - 120 mg / dl Body Mass Index (BMI): 30.2 Electronic Signature(s) Signed: 02/01/2021 5:21:41 PM By: Baruch Gouty RN, BSN Entered By: Baruch Gouty on 02/01/2021 14:37:16

## 2021-02-02 NOTE — Progress Notes (Addendum)
CHANZ, CAHALL IMarland Kitchen ([id number]) Visit Report for 02/01/2021 Chief Complaint Document Details Patient Name: Date of Service: [name] ID I. 02/01/2021 2:30 PM Medical Record Number: [id number] Patient Account Number: [id number] Date of Birth/Sex: Treating RN: 07/02/1926 (88 y.o. [name] Primary Care Provider: [name] Other Clinician: Referring Provider: Treating Provider/Extender: Eli Hose in Treatment: 23 Information Obtained from: Patient Chief Complaint Pressure ulcer right heel and left LE and ankle ulcers Electronic Signature(s) Signed: 02/01/2021 2:55:42 PM By: [name] PA-C Entered By: [name] on 02/01/2021 14:55:41 -------------------------------------------------------------------------------- HPI Details Patient Name: Date of Service: [name], [name] 02/01/2021 2:30 PM Medical Record Number: [id number] Patient Account Number: [id number] Date of Birth/Sex: Treating RN: 05-23-1926 (88 y.o. [name] Primary Care Provider: [name] Other Clinician: Referring Provider: Treating Provider/Extender: Eli Hose in Treatment: 19 History of Present Illness HPI Description: 09/21/2020 upon evaluation today patient appears to be doing somewhat poorly in regard to a wound in the right lateral heel location. This fortunately does not appear to be too bad but nonetheless is definitely a spot that we need to work on try to get healed for him. Has been here for quite some time. Fortunately there does not appear to be any evidence of active infection at this time which is great news. With that being said I do believe that he has some evidence here of peripheral vascular disease this is minimal his ABI 0.87. Other than that he does have a history of BPH, chronic kidney disease stage III, and hypertension. Currently he has been using different medication such as Silvadene or antibiotic ointment to try  to help treat things. This just is not helping out as efficiently as it should be. Of note patient's history includes that of a triple bypass in 2010, a kyphoplasty at L1 which was done 8 months ago, unfortunately he had a fracture 2 months ago which she sustained a T7/T8 fracture to his spine which ended up with him being in the hospital. It is in that time since then that he developed this wound roughly 2 to 3 months ago is not exactly sure when. He does typically wear compression socks in the 20 to 30 mmHg range. 09/28/2020 upon evaluation today patient presents for follow-up and overall seems to be doing more poorly based on what I am seeing currently. His ABI last time was measured at 0.87 but I am beginning to think this may be falsely elevated due to the fact that there is more eschar and subsequently a concern here of vascular compromise that may be part of her issue. I think that we need to avoid any additional sharp debridement and make a referral to vascular surgery as soon as possible here. Subsequently also went ahead and did discuss the possibility of starting the patient on Santyl instead of the collagen in order to help clean up the surface of the wound this will take the place of sharp debridement currently. 10/19/2020 upon evaluation today patient's wound actually appears to be doing okay although he has an area on the medial aspect of his heel that is only been showing up for the past couple of days this is more of a blister that has ruptured. The good news is there is no deep wound here which is good. Fortunately there is no signs of active infection at this time also good news. With that being  said however the blood flow was not good with an ABI of 0.67 on the right it was around 1 on the left nonetheless I think he does need to see vascular we put that referral in last week on the 12th Dr. Dellia Nims reviewed that for me and made that recommendations. Nonetheless the patient has not  heard anything from vascular yet as far as getting this scheduled. I think this needs to be done sooner rather than later as I am concerned about the possibility of a limb threatening situation if we do not get this under control.. 11/16/2020 upon evaluation today patient presents for follow-up regarding issues that he has been having with his bilateral lower extremities in particular. He in fact still has an issue with his right heel. This appears to be a stage III pressure ulcer at this point there is some slough noted. Subsequently he does have revascularization that is taking place on the right leg as a follow-up later this month in that regard. No repeat ABIs. Subsequently the patient also has venous stasis bilaterally which is also problematic for him at this time. He has some ulcers on the left ankle and left leg that are getting need to be addressed as well. 12/21/2020 upon evaluation today patient appears to be doing poorly in regard to his bilateral lower extremity ulcers. He still has areas on both lower extremities currently ineffective even some areas that were not there previous. Fortunately there is no signs of active infection at this time. No fevers, chills, nausea, vomiting, or diarrhea. 01/04/21 upon evaluation today patient actually appears to be doing quite well in regard to his wounds. Fortunately there does not appear to be any signs of active infection which is great. There is some necrotic tissue that I think would be helpful to debride away pretty much on all wounds other than the heel which appears to be doing quite well. 01/25/2021 upon evaluation today patient appears to be doing okay in regard to his wounds. There are some measurements that are definitely smaller which is good news. Fortunately I do not see any evidence of active infection locally nor systemically at this point which is also great news. He does have a deep tissue injury which seems to be getting a bit worse  on the lateral portion of his right heel. He tells me is not been using the bunny boots and he has not been using a pillow under his calves either. Nonetheless I think this is absolutely a pressure injury and I am very concerned about this. Also I do not want this to get a lot worse. He also tells me his hemoglobin is around 7 he is going to be seeing hematology for a transfusion either today or tomorrow most likely this will definitely help with his healing. 02/01/2021 upon evaluation today patient actually appears to be making excellent progress in regard to his wound currently. Fortunately I feel like that all the wounds are showing signs of improvement the Iodoflex seems to be doing an awesome job. I am very pleased with where we stand today. There does not appear to be any signs of active infection. Electronic Signature(s) Signed: 02/01/2021 3:34:34 PM By: [name] PA-C Entered By: [name] on 02/01/2021 15:34:33 -------------------------------------------------------------------------------- Physical Exam Details Patient Name: Date of Service: [name], [name] 02/01/2021 2:30 PM Medical Record Number: [id number] Patient Account Number: [id number] Date of Birth/Sex: Treating RN: June 04, 1926 (88 y.o. [name] Primary Care Provider:  [name] Other Clinician: Referring Provider: Treating Provider/Extender: [name] in Treatment: 39 Constitutional Well-nourished and well-hydrated in no acute distress. Respiratory normal breathing without difficulty. Psychiatric this patient is able to make decisions and demonstrates good insight into disease process. Alert and Oriented x 3. pleasant and cooperative. Notes Upon inspection patient's wound bed showed evidence of good granulation and epithelization at this point. Overall I think you are making excellent headway here and I am hopeful that he will continue to show signs of good improvement  week by week going forward. Electronic Signature(s) Signed: 02/01/2021 3:34:56 PM By: [name] PA-C Entered By: [name] on 02/01/2021 15:34:56 -------------------------------------------------------------------------------- Physician Orders Details Patient Name: Date of Service: [name], [name] 02/01/2021 2:30 PM Medical Record Number: [id number] Patient Account Number: [id number] Date of Birth/Sex: Treating RN: 1926/02/01 (88 y.o. [name], [name] Primary Care Provider: [name] Other Clinician: Referring Provider: Treating Provider/Extender: Eli Hose in Treatment: 48 Verbal / Phone Orders: No Diagnosis Coding ICD-10 Coding Code Description L89.613 Pressure ulcer of right heel, stage 3 I73.89 Other specified peripheral vascular diseases I87.2 Venous insufficiency (chronic) (peripheral) L97.822 Non-pressure chronic ulcer of other part of left lower leg with fat layer exposed L97.312 Non-pressure chronic ulcer of right ankle with fat layer exposed N40.1 Benign prostatic hyperplasia with lower urinary tract symptoms N18.30 Chronic kidney disease, stage 3 unspecified I10 Essential (primary) hypertension Follow-up Appointments ppointment in 2 weeks. Margarita Grizzle Wednesday ***Extra time 60 minutes*** Return A Other: - [employer]- supplies. Bathing/ Shower/ Hygiene May shower and wash wound with soap and water. - with dressing changes. Edema Control - Lymphedema / SCD / Other Elevate legs to the level of the heart or above for 30 minutes daily and/or when sitting, a frequency of: - throughout the day. Avoid standing for long periods of time. Moisturize legs daily. - both legs and feet with dressing changes. Off-Loading Other: - Heel cup to left heel for protection. bunny boots to both feet while resting in bed or chair. If unable to use bunny boots use pillow to elevate both legs to offload pressure to heels. Ensure no pressure to  wounds to aid in healing. Additional Orders / Instructions Other: - May continue to walk and work with physical therapy. Wound Treatment Wound #1 - Calcaneus Wound Laterality: Right, Lateral Cleanser: Soap and Water Every Other Day/30 Days Discharge Instructions: May shower and wash wound with dial antibacterial soap and water prior to dressing change. Prim Dressing: Iodosorb Gel 10 (gm) Tube (Generic) Every Other Day/30 Days ary Discharge Instructions: Apply to wound bed as instructed Secondary Dressing: Woven Gauze Sponge, Non-Sterile 4x4 in (Generic) Every Other Day/30 Days Discharge Instructions: Apply over primary dressing as directed. Secondary Dressing: ALLEVYN Heel 4 1/2in x 5 1/2in / 10.5cm x 13.5cm (Generic) Every Other Day/30 Days Discharge Instructions: Apply over primary dressing as directed. Secured With: [employer], 4.5x3.1 (in/yd) (Generic) Every Other Day/30 Days Discharge Instructions: Secure with Kerlix as directed. Secured With: 40M Medipore H Soft Cloth Surgical T ape, 4 x 10 (in/yd) (Generic) Every Other Day/30 Days Discharge Instructions: Secure with tape as directed. Wound #2 - Calcaneus Wound Laterality: Right, Medial Cleanser: Soap and Water Every Other Day/30 Days Discharge Instructions: May shower and wash wound with dial antibacterial soap and water prior to dressing change. Prim Dressing: Iodosorb Gel 10 (gm) Tube (Generic) Every Other Day/30 Days ary Discharge Instructions:  Apply to wound bed as instructed Secondary Dressing: Woven Gauze Sponge, Non-Sterile 4x4 in (Generic) Every Other Day/30 Days Discharge Instructions: Apply over primary dressing as directed. Secondary Dressing: ALLEVYN Heel 4 1/2in x 5 1/2in / 10.5cm x 13.5cm (Generic) Every Other Day/30 Days Discharge Instructions: Apply over primary dressing as directed. Secured With: [employer], 4.5x3.1 (in/yd) (Generic) Every Other Day/30 Days Discharge Instructions: Secure with  Kerlix as directed. Secured With: 79M Medipore H Soft Cloth Surgical T ape, 4 x 10 (in/yd) (Generic) Every Other Day/30 Days Discharge Instructions: Secure with tape as directed. Wound #3 - Ankle Wound Laterality: Left, Lateral Cleanser: Soap and Water Every Other Day/30 Days Discharge Instructions: May shower and wash wound with dial antibacterial soap and water prior to dressing change. Prim Dressing: Iodosorb Gel 10 (gm) Tube (Generic) Every Other Day/30 Days ary Discharge Instructions: Apply to wound bed as instructed Secondary Dressing: Zetuvit Plus Silicone Border Dressing 4x4 (in/in) (Generic) Every Other Day/30 Days Discharge Instructions: Apply silicone border over primary dressing as directed. Wound #4 - Lower Leg Wound Laterality: Left, Medial Cleanser: Soap and Water Every Other Day/30 Days Discharge Instructions: May shower and wash wound with dial antibacterial soap and water prior to dressing change. Prim Dressing: Iodosorb Gel 10 (gm) Tube (Generic) Every Other Day/30 Days ary Discharge Instructions: Apply to wound bed as instructed Secondary Dressing: Zetuvit Plus Silicone Border Dressing 4x4 (in/in) (Generic) Every Other Day/30 Days Discharge Instructions: Apply silicone border over primary dressing as directed. Wound #6 - Ankle Wound Laterality: Right, Lateral Cleanser: Soap and Water Every Other Day/30 Days Discharge Instructions: May shower and wash wound with dial antibacterial soap and water prior to dressing change. Prim Dressing: Iodosorb Gel 10 (gm) Tube Every Other Day/30 Days ary Discharge Instructions: Apply to wound bed as instructed Secondary Dressing: Zetuvit Plus Silicone Border Dressing 4x4 (in/in) (Generic) Every Other Day/30 Days Discharge Instructions: Apply silicone border over primary dressing as directed. Electronic Signature(s) Signed: 02/01/2021 4:27:52 PM By: [name] PA-C Signed: 02/01/2021 5:26:07 PM By: Deon Pilling RN, BSN Previous  Signature: 02/01/2021 3:56:32 PM Version By: [name] PA-C Entered By: Deon Pilling on 02/01/2021 15:58:48 -------------------------------------------------------------------------------- Problem List Details Patient Name: Date of Service: [name], [name] 02/01/2021 2:30 PM Medical Record Number: [id number] Patient Account Number: [id number] Date of Birth/Sex: Treating RN: [birth year] (88 y.o. [name] Primary Care Provider: [name] Other Clinician: Referring Provider: Treating Provider/Extender: Eli Hose in Treatment: 73 Active Problems ICD-10 Encounter Code Description Active Date MDM Diagnosis L89.613 Pressure ulcer of right heel, stage 3 09/21/2020 No Yes I73.89 Other specified peripheral vascular diseases 09/21/2020 No Yes I87.2 Venous insufficiency (chronic) (peripheral) 11/16/2020 No Yes L97.822 Non-pressure chronic ulcer of other part of left lower leg with fat layer 11/16/2020 No Yes exposed L97.312 Non-pressure chronic ulcer of right ankle with fat layer exposed 11/16/2020 No Yes N40.1 Benign prostatic hyperplasia with lower urinary tract symptoms 09/21/2020 No Yes N18.30 Chronic kidney disease, stage 3 unspecified 09/21/2020 No Yes I10 Essential (primary) hypertension 09/21/2020 No Yes Inactive Problems Resolved Problems Electronic Signature(s) Signed: 02/01/2021 2:55:25 PM By: [name] PA-C Entered By: [name] on 02/01/2021 14:55:24 -------------------------------------------------------------------------------- Progress Note Details Patient Name: Date of Service: [name], [name] 02/01/2021 2:30 PM Medical Record Number: [id number] Patient Account Number: [id number] Date of Birth/Sex: Treating RN: 1926/04/14 (88 y.o. [name] Primary Care Provider: [name] Other Clinician: Referring Provider:  Treating Provider/Extender: [name] in Treatment:  19 Subjective Chief Complaint Information obtained from Patient Pressure ulcer right heel and left LE and ankle ulcers History of Present Illness (HPI) 09/21/2020 upon evaluation today patient appears to be doing somewhat poorly in regard to a wound in the right lateral heel location. This fortunately does not appear to be too bad but nonetheless is definitely a spot that we need to work on try to get healed for him. Has been here for quite some time. Fortunately there does not appear to be any evidence of active infection at this time which is great news. With that being said I do believe that he has some evidence here of peripheral vascular disease this is minimal his ABI 0.87. Other than that he does have a history of BPH, chronic kidney disease stage III, and hypertension. Currently he has been using different medication such as Silvadene or antibiotic ointment to try to help treat things. This just is not helping out as efficiently as it should be. Of note patient's history includes that of a triple bypass in 2010, a kyphoplasty at L1 which was done 8 months ago, unfortunately he had a fracture 2 months ago which she sustained a T7/T8 fracture to his spine which ended up with him being in the hospital. It is in that time since then that he developed this wound roughly 2 to 3 months ago is not exactly sure when. He does typically wear compression socks in the 20 to 30 mmHg range. 09/28/2020 upon evaluation today patient presents for follow-up and overall seems to be doing more poorly based on what I am seeing currently. His ABI last time was measured at 0.87 but I am beginning to think this may be falsely elevated due to the fact that there is more eschar and subsequently a concern here of vascular compromise that may be part of her issue. I think that we need to avoid any additional sharp debridement and make a referral to vascular surgery as soon as possible here. Subsequently also went ahead  and did discuss the possibility of starting the patient on Santyl instead of the collagen in order to help clean up the surface of the wound this will take the place of sharp debridement currently. 10/19/2020 upon evaluation today patient's wound actually appears to be doing okay although he has an area on the medial aspect of his heel that is only been showing up for the past couple of days this is more of a blister that has ruptured. The good news is there is no deep wound here which is good. Fortunately there is no signs of active infection at this time also good news. With that being said however the blood flow was not good with an ABI of 0.67 on the right it was around 1 on the left nonetheless I think he does need to see vascular we put that referral in last week on the 12th Dr. Dellia Nims reviewed that for me and made that recommendations. Nonetheless the patient has not heard anything from vascular yet as far as getting this scheduled. I think this needs to be done sooner rather than later as I am concerned about the possibility of a limb threatening situation if we do not get this under control.. 11/16/2020 upon evaluation today patient presents for follow-up regarding issues that he has been having with his bilateral lower extremities in particular. He in fact still has an issue with his right  heel. This appears to be a stage III pressure ulcer at this point there is some slough noted. Subsequently he does have revascularization that is taking place on the right leg as a follow-up later this month in that regard. No repeat ABIs. Subsequently the patient also has venous stasis bilaterally which is also problematic for him at this time. He has some ulcers on the left ankle and left leg that are getting need to be addressed as well. 12/21/2020 upon evaluation today patient appears to be doing poorly in regard to his bilateral lower extremity ulcers. He still has areas on both lower  extremities currently ineffective even some areas that were not there previous. Fortunately there is no signs of active infection at this time. No fevers, chills, nausea, vomiting, or diarrhea. 01/04/21 upon evaluation today patient actually appears to be doing quite well in regard to his wounds. Fortunately there does not appear to be any signs of active infection which is great. There is some necrotic tissue that I think would be helpful to debride away pretty much on all wounds other than the heel which appears to be doing quite well. 01/25/2021 upon evaluation today patient appears to be doing okay in regard to his wounds. There are some measurements that are definitely smaller which is good news. Fortunately I do not see any evidence of active infection locally nor systemically at this point which is also great news. He does have a deep tissue injury which seems to be getting a bit worse on the lateral portion of his right heel. He tells me is not been using the bunny boots and he has not been using a pillow under his calves either. Nonetheless I think this is absolutely a pressure injury and I am very concerned about this. Also I do not want this to get a lot worse. He also tells me his hemoglobin is around 7 he is going to be seeing hematology for a transfusion either today or tomorrow most likely this will definitely help with his healing. 02/01/2021 upon evaluation today patient actually appears to be making excellent progress in regard to his wound currently. Fortunately I feel like that all the wounds are showing signs of improvement the Iodoflex seems to be doing an awesome job. I am very pleased with where we stand today. There does not appear to be any signs of active infection. Objective Constitutional Well-nourished and well-hydrated in no acute distress. Vitals Time Taken: 2:36 PM, Height: 72 in, Source: Stated, Weight: 223 lbs, Source: Stated, BMI: 30.2, Temperature: 97.7 F, Pulse:  66 bpm, Respiratory Rate: 18 breaths/min, Blood Pressure: 107/65 mmHg. Respiratory normal breathing without difficulty. Psychiatric this patient is able to make decisions and demonstrates good insight into disease process. Alert and Oriented x 3. pleasant and cooperative. General Notes: Upon inspection patient's wound bed showed evidence of good granulation and epithelization at this point. Overall I think you are making excellent headway here and I am hopeful that he will continue to show signs of good improvement week by week going forward. Integumentary (Hair, Skin) Wound #1 status is Open. Original cause of wound was Pressure Injury. The date acquired was: 06/08/2020. The wound has been in treatment 19 weeks. The wound is located on the Right,Lateral Calcaneus. The wound measures 2cm length x 2.5cm width x 0.2cm depth; 3.927cm^2 area and 0.785cm^3 volume. There is Fat Layer (Subcutaneous Tissue) exposed. There is no tunneling or undermining noted. There is a medium amount of serosanguineous drainage noted. The wound  margin is distinct with the outline attached to the wound base. There is medium (34-66%) pink, pale granulation within the wound bed. There is a medium (34-66%) amount of necrotic tissue within the wound bed including Adherent Slough. General Notes: drainage and old blood noted under periwound skin. Wound #2 status is Open. Original cause of wound was Pressure Injury. The date acquired was: 10/17/2020. The wound has been in treatment 15 weeks. The wound is located on the Right,Medial Calcaneus. The wound measures 1.4cm length x 3.5cm width x 0.2cm depth; 3.848cm^2 area and 0.77cm^3 volume. There is Fat Layer (Subcutaneous Tissue) exposed. There is no tunneling or undermining noted. There is a medium amount of serosanguineous drainage noted. The wound margin is distinct with the outline attached to the wound base. There is large (67-100%) red, pink, friable granulation within the wound  bed. There is a small (1-33%) amount of necrotic tissue within the wound bed including Adherent Slough. Wound #3 status is Open. Original cause of wound was Gradually Appeared. The date acquired was: 10/12/2020. The wound has been in treatment 11 weeks. The wound is located on the Left,Lateral Ankle. The wound measures 2.2cm length x 1.3cm width x 0.6cm depth; 2.246cm^2 area and 1.348cm^3 volume. There is Fat Layer (Subcutaneous Tissue) exposed. There is no tunneling or undermining noted. There is a medium amount of serosanguineous drainage noted. The wound margin is distinct with the outline attached to the wound base. There is small (1-33%) red granulation within the wound bed. There is a large (67-100%) amount of necrotic tissue within the wound bed including Adherent Slough. Wound #4 status is Open. Original cause of wound was Other Lesion. The date acquired was: 11/16/2020. The wound has been in treatment 11 weeks. The wound is located on the Left,Medial Lower Leg. The wound measures 1.1cm length x 0.9cm width x 0.6cm depth; 0.778cm^2 area and 0.467cm^3 volume. There is Fat Layer (Subcutaneous Tissue) exposed. There is no tunneling or undermining noted. There is a medium amount of serosanguineous drainage noted. The wound margin is well defined and not attached to the wound base. There is large (67-100%) red, pink granulation within the wound bed. There is a small (1-33%) amount of necrotic tissue within the wound bed including Adherent Slough. Wound #6 status is Open. Original cause of wound was Gradually Appeared. The date acquired was: 11/22/2020. The wound has been in treatment 6 weeks. The wound is located on the Right,Lateral Ankle. The wound measures 1.3cm length x 0.9cm width x 0.4cm depth; 0.919cm^2 area and 0.368cm^3 volume. There is Fat Layer (Subcutaneous Tissue) exposed. There is no tunneling or undermining noted. There is a medium amount of serosanguineous drainage noted. The wound  margin is distinct with the outline attached to the wound base. There is small (1-33%) red, pink granulation within the wound bed. There is a large (67- 100%) amount of necrotic tissue within the wound bed including Adherent Slough. Wound #7 status is Open. Original cause of wound was Trauma. The date acquired was: 01/11/2021. The wound has been in treatment 1 weeks. The wound is located on the Left,Lateral Calcaneus. The wound measures 0cm length x 0cm width x 0cm depth; 0cm^2 area and 0cm^3 volume. There is no tunneling or undermining noted. There is a none present amount of drainage noted. The wound margin is distinct with the outline attached to the wound base. There is no granulation within the wound bed. There is no necrotic tissue within the wound bed. 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 Margarita Grizzle Wednesday ***Extra time 60 minutes*** Other: - [employer]- supplies. Bathing/ Shower/ Hygiene: May shower and wash wound with soap and water. - with dressing changes. Edema Control - Lymphedema / SCD / Other: Elevate legs to the level of the heart or above for 30 minutes daily and/or when sitting, a frequency of: - throughout the day. Avoid standing for long periods of time. Moisturize legs daily. - both legs and feet with dressing changes. Off-Loading: Other: - Heel cup to left heel for protection. bunny boots to both feet while resting in bed or chair. If unable to use bunny boots use pillow to elevate both legs to  offload pressure to heels. Ensure no pressure to wounds to aid in healing. Additional Orders / Instructions: Other: - May continue to walk and work with physical therapy. WOUND #1: - Calcaneus Wound Laterality: Right, Lateral Cleanser: Soap and Water Every Other Day/30 Days Discharge Instructions: May shower and wash wound with dial antibacterial soap and water prior to dressing change. Prim Dressing: Iodosorb Gel 10 (gm) Tube (Generic) Every Other Day/30 Days ary Discharge Instructions: Apply to wound bed as instructed Secondary Dressing: Woven Gauze Sponge, Non-Sterile 4x4 in (Generic) Every Other Day/30 Days Discharge Instructions: Apply over primary dressing as directed. Secondary Dressing: ALLEVYN Heel 4 1/2in x 5 1/2in / 10.5cm x 13.5cm (Generic) Every Other Day/30 Days Discharge Instructions: Apply over primary dressing as directed. Secured With: [employer], 4.5x3.1 (in/yd) (Generic) Every Other Day/30 Days Discharge Instructions: Secure with Kerlix as directed. Secured With: 31M Medipore H Soft Cloth Surgical T ape, 4 x 10 (in/yd) (Generic) Every Other Day/30 Days Discharge Instructions: Secure with tape as directed. WOUND #2: - Calcaneus Wound Laterality: Right, Medial Cleanser: Soap and Water Every Other Day/30 Days Discharge Instructions: May shower and wash wound with dial antibacterial soap and water prior to dressing change. Prim Dressing: Iodosorb Gel 10 (gm) Tube (Generic) Every Other Day/30 Days ary Discharge Instructions: Apply to wound bed as instructed Secondary Dressing: Woven Gauze Sponge, Non-Sterile 4x4 in (Generic) Every Other Day/30 Days Discharge Instructions: Apply over primary dressing as directed. Secondary Dressing: ALLEVYN Heel 4 1/2in x 5 1/2in / 10.5cm x 13.5cm (Generic) Every Other Day/30 Days Discharge Instructions: Apply over primary dressing as directed. Secured With: [employer], 4.5x3.1 (in/yd) (Generic) Every Other Day/30  Days Discharge Instructions: Secure with Kerlix as directed. Secured With: 31M Medipore H Soft Cloth Surgical T ape, 4 x 10 (in/yd) (Generic) Every Other Day/30 Days Discharge Instructions: Secure with tape as directed. WOUND #3: - Ankle Wound Laterality: Left, Lateral Cleanser: Soap and Water Every Other Day/30 Days Discharge Instructions: May shower and wash wound with dial antibacterial soap and water prior to dressing change. Prim Dressing: Iodosorb Gel 10 (gm) Tube (Generic) Every Other Day/30 Days ary Discharge Instructions: Apply to wound bed as instructed Secondary Dressing: Zetuvit Plus Silicone Border Dressing 4x4 (in/in) (Generic) Every Other Day/30 Days Discharge Instructions: Apply silicone border over primary dressing as directed. WOUND #4: - Lower Leg Wound Laterality: Left, Medial Cleanser: Soap and Water Every Other Day/30 Days Discharge Instructions: May shower and wash wound  with dial antibacterial soap and water prior to dressing change. Prim Dressing: Iodosorb Gel 10 (gm) Tube (Generic) Every Other Day/30 Days ary Discharge Instructions: Apply to wound bed as instructed Secondary Dressing: Zetuvit Plus Silicone Border Dressing 4x4 (in/in) (Generic) Every Other Day/30 Days Discharge Instructions: Apply silicone border over primary dressing as directed. WOUND #6: - Ankle Wound Laterality: Right, Lateral Cleanser: Soap and Water Every Other Day/30 Days Discharge Instructions: May shower and wash wound with dial antibacterial soap and water prior to dressing change. Prim Dressing: Iodosorb Gel 10 (gm) Tube Every Other Day/30 Days ary Discharge Instructions: Apply to wound bed as instructed Secondary Dressing: Zetuvit Plus Silicone Border Dressing 4x4 (in/in) (Generic) Every Other Day/30 Days Discharge Instructions: Apply silicone border over primary dressing as directed. 1. I recommend currently that we going to continue with the wound care measures as before the patient  is in agreement the plan. This includes the use of the Iodoflex which I think is doing an awesome job. 2. I am also going to and recommend the patient should continue to keep pressure off of his heels I think this is of the very great importance. 3. Now that he has had a blood transfusion this should be better as well as far as healing is concerned I think that is can to stimulate some things here. We will see patient back for reevaluation in 1 week here in the clinic. If anything worsens or changes patient will contact our office for additional recommendations. Electronic Signature(s) Signed: 02/01/2021 3:35:24 PM By: [name] PA-C Entered By: [name] on 02/01/2021 15:35:23 -------------------------------------------------------------------------------- SuperBill Details Patient Name: Date of Service: [name], [name] 02/01/2021 Medical Record Number: [id number] Patient Account Number: [id number] Date of Birth/Sex: Treating RN: [birth year] (88 y.o. 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 N40.1 Benign prostatic hyperplasia with lower urinary tract symptoms N18.30 Chronic kidney disease, stage 3 unspecified I10 Essential (primary) hypertension Facility Procedures CPT4 Code: [id number] Description: [phone] - WOUND CARE VISIT-LEV 5 EST PT Modifier: Quantity: 1 Physician Procedures : CPT4 Code Description Modifier [id number] - WC PHYS LEVEL 4 - EST PT ICD-10 Diagnosis Description L89.613  Pressure ulcer of right heel, stage 3 I73.89 Other specified peripheral vascular diseases I87.2 Venous insufficiency (chronic) (peripheral)  L97.822 Non-pressure chronic ulcer of other part of left lower leg with fat layer exposed Quantity: 1 Electronic Signature(s) Signed: 02/01/2021 3:35:40 PM By: [name] PA-C Entered By: [name] on 02/01/2021 15:35:39

## 2021-02-10 DIAGNOSIS — D631 Anemia in chronic kidney disease: Secondary | ICD-10-CM | POA: Diagnosis not present

## 2021-02-10 DIAGNOSIS — S81802D Unspecified open wound, left lower leg, subsequent encounter: Secondary | ICD-10-CM | POA: Diagnosis not present

## 2021-02-10 DIAGNOSIS — I5021 Acute systolic (congestive) heart failure: Secondary | ICD-10-CM | POA: Diagnosis not present

## 2021-02-10 DIAGNOSIS — N1831 Chronic kidney disease, stage 3a: Secondary | ICD-10-CM | POA: Diagnosis not present

## 2021-02-10 DIAGNOSIS — N179 Acute kidney failure, unspecified: Secondary | ICD-10-CM | POA: Diagnosis not present

## 2021-02-10 DIAGNOSIS — N390 Urinary tract infection, site not specified: Secondary | ICD-10-CM | POA: Diagnosis not present

## 2021-02-10 DIAGNOSIS — I130 Hypertensive heart and chronic kidney disease with heart failure and stage 1 through stage 4 chronic kidney disease, or unspecified chronic kidney disease: Secondary | ICD-10-CM | POA: Diagnosis not present

## 2021-02-10 DIAGNOSIS — I480 Paroxysmal atrial fibrillation: Secondary | ICD-10-CM | POA: Diagnosis not present

## 2021-02-10 DIAGNOSIS — M353 Polymyalgia rheumatica: Secondary | ICD-10-CM | POA: Diagnosis not present

## 2021-02-10 DIAGNOSIS — R338 Other retention of urine: Secondary | ICD-10-CM | POA: Diagnosis not present

## 2021-02-10 DIAGNOSIS — M8008XD Age-related osteoporosis with current pathological fracture, vertebra(e), subsequent encounter for fracture with routine healing: Secondary | ICD-10-CM | POA: Diagnosis not present

## 2021-02-10 DIAGNOSIS — I5032 Chronic diastolic (congestive) heart failure: Secondary | ICD-10-CM | POA: Diagnosis not present

## 2021-02-10 DIAGNOSIS — L89611 Pressure ulcer of right heel, stage 1: Secondary | ICD-10-CM | POA: Diagnosis not present

## 2021-02-10 DIAGNOSIS — N401 Enlarged prostate with lower urinary tract symptoms: Secondary | ICD-10-CM | POA: Diagnosis not present

## 2021-02-10 DIAGNOSIS — N39498 Other specified urinary incontinence: Secondary | ICD-10-CM | POA: Diagnosis not present

## 2021-02-10 DIAGNOSIS — B965 Pseudomonas (aeruginosa) (mallei) (pseudomallei) as the cause of diseases classified elsewhere: Secondary | ICD-10-CM | POA: Diagnosis not present

## 2021-02-11 ENCOUNTER — Other Ambulatory Visit: Payer: Self-pay | Admitting: Cardiology

## 2021-02-11 DIAGNOSIS — I480 Paroxysmal atrial fibrillation: Secondary | ICD-10-CM

## 2021-02-12 DIAGNOSIS — I259 Chronic ischemic heart disease, unspecified: Secondary | ICD-10-CM | POA: Diagnosis not present

## 2021-02-12 DIAGNOSIS — G4733 Obstructive sleep apnea (adult) (pediatric): Secondary | ICD-10-CM | POA: Diagnosis not present

## 2021-02-13 ENCOUNTER — Encounter: Payer: Self-pay | Admitting: Podiatry

## 2021-02-13 ENCOUNTER — Ambulatory Visit: Payer: Medicare Other | Admitting: Podiatry

## 2021-02-13 DIAGNOSIS — B351 Tinea unguium: Secondary | ICD-10-CM | POA: Diagnosis not present

## 2021-02-13 DIAGNOSIS — N179 Acute kidney failure, unspecified: Secondary | ICD-10-CM | POA: Diagnosis not present

## 2021-02-13 DIAGNOSIS — D696 Thrombocytopenia, unspecified: Secondary | ICD-10-CM

## 2021-02-13 DIAGNOSIS — M109 Gout, unspecified: Secondary | ICD-10-CM | POA: Insufficient documentation

## 2021-02-13 DIAGNOSIS — N1831 Chronic kidney disease, stage 3a: Secondary | ICD-10-CM | POA: Diagnosis not present

## 2021-02-13 DIAGNOSIS — M79674 Pain in right toe(s): Secondary | ICD-10-CM

## 2021-02-13 DIAGNOSIS — M79675 Pain in left toe(s): Secondary | ICD-10-CM

## 2021-02-13 NOTE — Progress Notes (Signed)
This patient returns to my office for at risk foot care.  This patient requires this care by a professional since this patient will be at risk due to having thrombocytopenia, CKD and chronic venous stasis  B/L.  Patient has had vascular surgery and had a transfusion lest week.  This patient is unable to cut nails himself since the patient cannot reach his nails.These nails are painful walking and wearing shoes.  He presents to the office today with his daughter. This patient presents for at risk foot care today.  General Appearance  Alert, conversant and in no acute stress.  Vascular  Dorsalis pedis and posterior tibial  pulses are not palpable due to swelling  and bandaging. bilaterally.  Capillary return is within normal limits  bilaterally. Temperature is diminished  bilaterally.  Neurologic  Senn-Weinstein monofilament wire test within normal limits  bilaterally. Muscle power within normal limits bilaterally.  Nails Thick disfigured discolored nails with subungual debris  from hallux to fifth toes bilaterally. No evidence of bacterial infection or drainage bilaterally.  Orthopedic  No limitations of motion  feet .  No crepitus or effusions noted.  No bony pathology or digital deformities noted.  Skin  normotropic skin with no porokeratosis noted bilaterally.  No signs of infections or ulcers noted.     Onychomycosis  Pain in right toes  Pain in left toes    Consent was obtained for treatment procedures.   Mechanical debridement of nails 1-5  bilaterally performed with a nail nipper.  Filed with dremel without incident.    Return office visit  prn                  Told patient to return for periodic foot care and evaluation due to potential at risk complications.    Sleep with heel on a pillow.    Gardiner Barefoot DPM

## 2021-02-14 DIAGNOSIS — D649 Anemia, unspecified: Secondary | ICD-10-CM | POA: Diagnosis not present

## 2021-02-14 DIAGNOSIS — E785 Hyperlipidemia, unspecified: Secondary | ICD-10-CM | POA: Diagnosis not present

## 2021-02-14 DIAGNOSIS — I480 Paroxysmal atrial fibrillation: Secondary | ICD-10-CM | POA: Diagnosis not present

## 2021-02-14 DIAGNOSIS — I129 Hypertensive chronic kidney disease with stage 1 through stage 4 chronic kidney disease, or unspecified chronic kidney disease: Secondary | ICD-10-CM | POA: Diagnosis not present

## 2021-02-15 ENCOUNTER — Encounter (HOSPITAL_BASED_OUTPATIENT_CLINIC_OR_DEPARTMENT_OTHER): Payer: Medicare Other | Admitting: Physician Assistant

## 2021-02-15 DIAGNOSIS — G4733 Obstructive sleep apnea (adult) (pediatric): Secondary | ICD-10-CM | POA: Diagnosis not present

## 2021-02-15 DIAGNOSIS — I259 Chronic ischemic heart disease, unspecified: Secondary | ICD-10-CM | POA: Diagnosis not present

## 2021-02-15 NOTE — Telephone Encounter (Signed)
Yes that is okay to refill metoprolol succinate 12.5 mg daily

## 2021-02-15 NOTE — Telephone Encounter (Signed)
Yes he is

## 2021-02-15 NOTE — Telephone Encounter (Signed)
Patient is taking metoprolol succinate 12.5mg  daily  Is it okay to refill

## 2021-02-15 NOTE — Telephone Encounter (Signed)
This refill is still tartrate. But he is indeed taking succinate?

## 2021-02-15 NOTE — Telephone Encounter (Signed)
It looks like this medication was prescribed and discontinued by another provider. Do you want to refill this?

## 2021-02-15 NOTE — Telephone Encounter (Signed)
Declined this as patient should be taking metoprolol SUCCINATE. Can you please confirm with patient.

## 2021-02-16 ENCOUNTER — Other Ambulatory Visit: Payer: Self-pay | Admitting: Student

## 2021-02-19 ENCOUNTER — Other Ambulatory Visit: Payer: Self-pay | Admitting: Cardiology

## 2021-02-19 DIAGNOSIS — I480 Paroxysmal atrial fibrillation: Secondary | ICD-10-CM

## 2021-02-20 ENCOUNTER — Other Ambulatory Visit: Payer: Self-pay

## 2021-02-20 MED ORDER — METOPROLOL SUCCINATE ER 25 MG PO TB24: 12.5000 mg | ORAL_TABLET | Freq: Every day | ORAL | 1 refills | Status: DC | PRN

## 2021-02-22 ENCOUNTER — Encounter (HOSPITAL_BASED_OUTPATIENT_CLINIC_OR_DEPARTMENT_OTHER): Payer: Medicare Other | Admitting: Physician Assistant

## 2021-02-22 ENCOUNTER — Encounter (HOSPITAL_COMMUNITY): Payer: Medicare Other

## 2021-02-22 ENCOUNTER — Ambulatory Visit: Payer: Medicare Other

## 2021-02-22 ENCOUNTER — Other Ambulatory Visit: Payer: Self-pay

## 2021-02-22 DIAGNOSIS — I129 Hypertensive chronic kidney disease with stage 1 through stage 4 chronic kidney disease, or unspecified chronic kidney disease: Secondary | ICD-10-CM | POA: Diagnosis not present

## 2021-02-22 DIAGNOSIS — N183 Chronic kidney disease, stage 3 unspecified: Secondary | ICD-10-CM | POA: Diagnosis not present

## 2021-02-22 DIAGNOSIS — L89613 Pressure ulcer of right heel, stage 3: Secondary | ICD-10-CM | POA: Diagnosis not present

## 2021-02-22 DIAGNOSIS — N401 Enlarged prostate with lower urinary tract symptoms: Secondary | ICD-10-CM | POA: Diagnosis not present

## 2021-02-22 DIAGNOSIS — I872 Venous insufficiency (chronic) (peripheral): Secondary | ICD-10-CM | POA: Diagnosis not present

## 2021-02-22 DIAGNOSIS — I7389 Other specified peripheral vascular diseases: Secondary | ICD-10-CM | POA: Diagnosis not present

## 2021-02-22 DIAGNOSIS — L97322 Non-pressure chronic ulcer of left ankle with fat layer exposed: Secondary | ICD-10-CM | POA: Diagnosis not present

## 2021-02-22 DIAGNOSIS — L97822 Non-pressure chronic ulcer of other part of left lower leg with fat layer exposed: Secondary | ICD-10-CM | POA: Diagnosis not present

## 2021-02-22 DIAGNOSIS — R3914 Feeling of incomplete bladder emptying: Secondary | ICD-10-CM | POA: Diagnosis not present

## 2021-02-22 DIAGNOSIS — L97312 Non-pressure chronic ulcer of right ankle with fat layer exposed: Secondary | ICD-10-CM | POA: Diagnosis not present

## 2021-02-22 NOTE — Progress Notes (Addendum)
[name] [name], [name] [name] IMarland Kitchen ([id number]) Visit Report for 02/22/2021 Chief Complaint Document Details Patient Name: Date of Service: [name] [name] ID I. 02/22/2021 2:45 PM Medical Record Number: [id number] Patient Account Number: [id number] Date of Birth/Sex: Treating RN: Jan 06, 1926 (88 y.o. M) Primary Care Provider: [name] Other Clinician: Referring Provider: Treating Provider/Extender: Eli Hose in Treatment: 22 Information Obtained from: Patient Chief Complaint Pressure ulcer right heel and left LE and ankle ulcers Electronic Signature(s) Signed: 02/22/2021 3:00:52 PM By: [name] PA-C Entered By: [name] on 02/22/2021 15:00:52 -------------------------------------------------------------------------------- HPI Details Patient Name: Date of Service: [name], [name] 02/22/2021 2:45 PM Medical Record Number: [id number] Patient Account Number: [id number] Date of Birth/Sex: Treating RN: [birth year] (88 y.o. M) Primary Care Provider: [name] Other Clinician: Referring Provider: Treating Provider/Extender: Eli Hose in Treatment: 22 History of Present Illness HPI Description: 09/21/2020 upon evaluation today patient appears to be doing somewhat poorly in regard to a wound in the right lateral heel location. This fortunately does not appear to be too bad but nonetheless is definitely a spot that we need to work on try to get healed for him. Has been here for quite some time. Fortunately there does not appear to be any evidence of active infection at this time which is great news. With that being said I do believe that he has some evidence here of peripheral vascular disease this is minimal his ABI 0.87. Other than that he does have a history of BPH, chronic kidney disease stage III, and hypertension. Currently he has been using different medication such as Silvadene or antibiotic ointment to try to help treat things. This  just is not helping out as efficiently as it should be. Of note patient's history includes that of a triple bypass in 2010, a kyphoplasty at L1 which was done 8 months ago, unfortunately he had a fracture 2 months ago which she sustained a T7/T8 fracture to his spine which ended up with him being in the hospital. It is in that time since then that he developed this wound roughly 2 to 3 months ago is not exactly sure when. He does typically wear compression socks in the 20 to 30 mmHg range. 09/28/2020 upon evaluation today patient presents for follow-up and overall seems to be doing more poorly based on what I am seeing currently. His ABI last time was measured at 0.87 but I am beginning to think this may be falsely elevated due to the fact that there is more eschar and subsequently a concern here of vascular compromise that may be part of her issue. I think that we need to avoid any additional sharp debridement and make a referral to vascular surgery as soon as possible here. Subsequently also went ahead and did discuss the possibility of starting the patient on Santyl instead of the collagen in order to help clean up the surface of the wound this will take the place of sharp debridement currently. 10/19/2020 upon evaluation today patient's wound actually appears to be doing okay although he has an area on the medial aspect of his heel that is only been showing up for the past couple of days this is more of a blister that has ruptured. The good news is there is no deep wound here which is good. Fortunately there is no signs of active infection at this time also good news. With that being said however the blood  flow was not good with an ABI of 0.67 on the right it was around 1 on the left nonetheless I think he does need to see vascular we put that referral in last week on the 12th Dr. Dellia Nims reviewed that for me and made that recommendations. Nonetheless the patient has not heard anything from vascular  yet as far as getting this scheduled. I think this needs to be done sooner rather than later as I am concerned about the possibility of a limb threatening situation if we do not get this under control.. 11/16/2020 upon evaluation today patient presents for follow-up regarding issues that he has been having with his bilateral lower extremities in particular. He in fact still has an issue with his right heel. This appears to be a stage III pressure ulcer at this point there is some slough noted. Subsequently he does have revascularization that is taking place on the right leg as a follow-up later this month in that regard. No repeat ABIs. Subsequently the patient also has venous stasis bilaterally which is also problematic for him at this time. He has some ulcers on the left ankle and left leg that are getting need to be addressed as well. 12/21/2020 upon evaluation today patient appears to be doing poorly in regard to his bilateral lower extremity ulcers. He still has areas on both lower extremities currently ineffective even some areas that were not there previous. Fortunately there is no signs of active infection at this time. No fevers, chills, nausea, vomiting, or diarrhea. 01/04/21 upon evaluation today patient actually appears to be doing quite well in regard to his wounds. Fortunately there does not appear to be any signs of active infection which is great. There is some necrotic tissue that I think would be helpful to debride away pretty much on all wounds other than the heel which appears to be doing quite well. 01/25/2021 upon evaluation today patient appears to be doing okay in regard to his wounds. There are some measurements that are definitely smaller which is good news. Fortunately I do not see any evidence of active infection locally nor systemically at this point which is also great news. He does have a deep tissue injury which seems to be getting a bit worse on the lateral portion of his  right heel. He tells me is not been using the bunny boots and he has not been using a pillow under his calves either. Nonetheless I think this is absolutely a pressure injury and I am very concerned about this. Also I do not want this to get a lot worse. He also tells me his hemoglobin is around 7 he is going to be seeing hematology for a transfusion either today or tomorrow most likely this will definitely help with his healing. 02/01/2021 upon evaluation today patient actually appears to be making excellent progress in regard to his wound currently. Fortunately I feel like that all the wounds are showing signs of improvement the Iodoflex seems to be doing an awesome job. I am very pleased with where we stand today. There does not appear to be any signs of active infection. 02/22/2021 upon evaluation today patient appears to be doing well with regard to his wound. He has been tolerating the dressing changes without complication. Fortunately there does not appear to be any evidence of active infection locally or systemically at this point which is good news. Overall I think that we are headed in the right direction and the Iodoflex is doing a  good job. Electronic Signature(s) Signed: 02/22/2021 3:43:04 PM By: [name] PA-C Entered By: [name] on 02/22/2021 15:43:04 -------------------------------------------------------------------------------- Physical Exam Details Patient Name: Date of Service: [name] [name] ID I. 02/22/2021 2:45 PM Medical Record Number: [id number] Patient Account Number: [id number] Date of Birth/Sex: Treating RN: 1926/04/04 (88 y.o. M) Primary Care Provider: [name] Other Clinician: Referring Provider: Treating Provider/Extender: Eli Hose in Treatment: 53 Constitutional Well-nourished and well-hydrated in no acute distress. Respiratory normal breathing without difficulty. Psychiatric this patient is able to make decisions and  demonstrates good insight into disease process. Alert and Oriented x 3. pleasant and cooperative. Notes Upon inspection patient's wound bed showed signs of all across the board of doing quite well. I am extremely pleased with where we stand there is no significant bleeding which is great news and in general I think there were making good progress here. No fevers, chills, nausea, vomiting, or diarrhea. Electronic Signature(s) Signed: 02/22/2021 3:43:19 PM By: [name] PA-C Entered By: [name] on 02/22/2021 15:43:19 -------------------------------------------------------------------------------- Physician Orders Details Patient Name: Date of Service: [name], [name] 02/22/2021 2:45 PM Medical Record Number: [id number] Patient Account Number: [id number] Date of Birth/Sex: Treating RN: Apr 05, 1926 (88 y.o. Burnadette Pop, Lauren Primary Care Provider: [name] Other Clinician: Referring Provider: Treating Provider/Extender: Eli Hose in Treatment: 27 Verbal / Phone Orders: No Diagnosis Coding ICD-10 Coding Code Description L89.613 Pressure ulcer of right heel, stage 3 I73.89 Other specified peripheral vascular diseases I87.2 Venous insufficiency (chronic) (peripheral) L97.822 Non-pressure chronic ulcer of other part of left lower leg with fat layer exposed L97.312 Non-pressure chronic ulcer of right ankle with fat layer exposed N40.1 Benign prostatic hyperplasia with lower urinary tract symptoms N18.30 Chronic kidney disease, stage 3 unspecified I10 Essential (primary) hypertension Follow-up Appointments ppointment in 2 weeks. Margarita Grizzle Wednesday and [name] Room # 8 ***Extra time 60 minutes*** Return A Other: - [employer]- supplies. Bathing/ Shower/ Hygiene May shower and wash wound with soap and water. - with dressing changes. Edema Control - Lymphedema / SCD / Other Elevate legs to the level of the heart or above for 30 minutes daily  and/or when sitting, a frequency of: - throughout the day. Avoid standing for long periods of time. Moisturize legs daily. - both legs and feet with dressing changes. Off-Loading Other: - Heel cup to left heel for protection. bunny boots to both feet while resting in bed or chair. If unable to use bunny boots use pillow to elevate both legs to offload pressure to heels. Ensure no pressure to wounds to aid in healing. Additional Orders / Instructions Other: - May continue to walk and work with physical therapy. Wound Treatment Wound #1 - Calcaneus Wound Laterality: Right, Lateral Cleanser: Soap and Water Every Other Day/30 Days Discharge Instructions: May shower and wash wound with dial antibacterial soap and water prior to dressing change. Prim Dressing: Iodosorb Gel 10 (gm) Tube (DME) (Generic) Every Other Day/30 Days ary Discharge Instructions: Apply to wound bed as instructed Secondary Dressing: Woven Gauze Sponge, Non-Sterile 4x4 in (Generic) Every Other Day/30 Days Discharge Instructions: Apply over primary dressing as directed. Secondary Dressing: Zetuvit Plus Silicone Border Dressing 4x4 (in/in) (DME) (Generic) Every Other Day/30 Days Discharge Instructions: Apply silicone border over primary dressing as directed. Secured With: [employer], 4.5x3.1 (in/yd) (DME) (Generic) Every Other Day/30 Days Discharge Instructions: Secure with Kerlix as directed. Secured  With: 75M Medipore H Soft Cloth Surgical T ape, 4 x 10 (in/yd) (Generic) Every Other Day/30 Days Discharge Instructions: Secure with tape as directed. Wound #2 - Calcaneus Wound Laterality: Right, Medial Cleanser: Soap and Water Every Other Day/30 Days Discharge Instructions: May shower and wash wound with dial antibacterial soap and water prior to dressing change. Prim Dressing: Iodosorb Gel 10 (gm) Tube (DME) (Generic) Every Other Day/30 Days ary Discharge Instructions: Apply to wound bed as instructed Secondary  Dressing: Woven Gauze Sponge, Non-Sterile 4x4 in (Generic) Every Other Day/30 Days Discharge Instructions: Apply over primary dressing as directed. Secondary Dressing: Zetuvit Plus Silicone Border Dressing 4x4 (in/in) (DME) (Generic) Every Other Day/30 Days Discharge Instructions: Apply silicone border over primary dressing as directed. Secured With: [employer], 4.5x3.1 (in/yd) (DME) (Generic) Every Other Day/30 Days Discharge Instructions: Secure with Kerlix as directed. Secured With: 75M Medipore H Soft Cloth Surgical T ape, 4 x 10 (in/yd) (Generic) Every Other Day/30 Days Discharge Instructions: Secure with tape as directed. Wound #3 - Ankle Wound Laterality: Left, Lateral Cleanser: Soap and Water Every Other Day/30 Days Discharge Instructions: May shower and wash wound with dial antibacterial soap and water prior to dressing change. Prim Dressing: Iodosorb Gel 10 (gm) Tube (DME) (Generic) Every Other Day/30 Days ary Discharge Instructions: Apply to wound bed as instructed Secondary Dressing: Woven Gauze Sponge, Non-Sterile 4x4 in (Generic) Every Other Day/30 Days Discharge Instructions: Apply over primary dressing as directed. Secondary Dressing: Zetuvit Plus Silicone Border Dressing 4x4 (in/in) (DME) (Generic) Every Other Day/30 Days Discharge Instructions: Apply silicone border over primary dressing as directed. Secured With: [employer], 4.5x3.1 (in/yd) (DME) (Generic) Every Other Day/30 Days Discharge Instructions: Secure with Kerlix as directed. Secured With: 75M Medipore H Soft Cloth Surgical T ape, 4 x 10 (in/yd) (Generic) Every Other Day/30 Days Discharge Instructions: Secure with tape as directed. Wound #4 - Lower Leg Wound Laterality: Left, Medial Cleanser: Soap and Water Every Other Day/30 Days Discharge Instructions: May shower and wash wound with dial antibacterial soap and water prior to dressing change. Prim Dressing: Iodosorb Gel 10 (gm) Tube (DME)  (Generic) Every Other Day/30 Days ary Discharge Instructions: Apply to wound bed as instructed Secondary Dressing: Woven Gauze Sponge, Non-Sterile 4x4 in (Generic) Every Other Day/30 Days Discharge Instructions: Apply over primary dressing as directed. Secondary Dressing: Zetuvit Plus Silicone Border Dressing 4x4 (in/in) (DME) (Generic) Every Other Day/30 Days Discharge Instructions: Apply silicone border over primary dressing as directed. Secured With: [employer], 4.5x3.1 (in/yd) (DME) (Generic) Every Other Day/30 Days Discharge Instructions: Secure with Kerlix as directed. Secured With: 75M Medipore H Soft Cloth Surgical T ape, 4 x 10 (in/yd) (Generic) Every Other Day/30 Days Discharge Instructions: Secure with tape as directed. Wound #6 - Ankle Wound Laterality: Right, Lateral Cleanser: Soap and Water Every Other Day/30 Days Discharge Instructions: May shower and wash wound with dial antibacterial soap and water prior to dressing change. Prim Dressing: Iodosorb Gel 10 (gm) Tube (DME) (Generic) Every Other Day/30 Days ary Discharge Instructions: Apply to wound bed as instructed Secondary Dressing: Woven Gauze Sponge, Non-Sterile 4x4 in (Generic) Every Other Day/30 Days Discharge Instructions: Apply over primary dressing as directed. Secondary Dressing: Zetuvit Plus Silicone Border Dressing 4x4 (in/in) (DME) (Generic) Every Other Day/30 Days Discharge Instructions: Apply silicone border over primary dressing as directed. Secured With: [employer], 4.5x3.1 (in/yd) (DME) (Generic) Every Other Day/30 Days Discharge Instructions: Secure with Kerlix as directed. Secured With: 75M Medipore H Soft Cloth Surgical T ape,  4 x 10 (in/yd) (Generic) Every Other Day/30 Days Discharge Instructions: Secure with tape as directed. Electronic Signature(s) Signed: 02/22/2021 5:31:46 PM By: Rhae Hammock RN Signed: 02/28/2021 12:04:32 PM By: [name] PA-C Previous Signature: 02/22/2021  4:57:32 PM Version By: [name] PA-C Entered By: Rhae Hammock on 02/22/2021 17:15:45 -------------------------------------------------------------------------------- Problem List Details Patient Name: Date of Service: [name], [name] 02/22/2021 2:45 PM Medical Record Number: [id number] Patient Account Number: [id number] Date of Birth/Sex: Treating RN: [birth year] (88 y.o. M) Primary Care Provider: [name] Other Clinician: Referring Provider: Treating Provider/Extender: Eli Hose in Treatment: 22 Active Problems ICD-10 Encounter Code Description Active Date MDM Diagnosis L89.613 Pressure ulcer of right heel, stage 3 09/21/2020 No Yes I73.89 Other specified peripheral vascular diseases 09/21/2020 No Yes I87.2 Venous insufficiency (chronic) (peripheral) 11/16/2020 No Yes L97.822 Non-pressure chronic ulcer of other part of left lower leg with fat layer 11/16/2020 No Yes exposed L97.312 Non-pressure chronic ulcer of right ankle with fat layer exposed 11/16/2020 No Yes N40.1 Benign prostatic hyperplasia with lower urinary tract symptoms 09/21/2020 No Yes N18.30 Chronic kidney disease, stage 3 unspecified 09/21/2020 No Yes I10 Essential (primary) hypertension 09/21/2020 No Yes Inactive Problems Resolved Problems Electronic Signature(s) Signed: 02/22/2021 3:00:35 PM By: [name] PA-C Entered By: [name] on 02/22/2021 15:00:35 -------------------------------------------------------------------------------- Progress Note Details Patient Name: Date of Service: [name], [name] 02/22/2021 2:45 PM Medical Record Number: [id number] Patient Account Number: [id number] Date of Birth/Sex: Treating RN: [birth year] (88 y.o. M) Primary Care Provider: [name] Other Clinician: Referring Provider: Treating Provider/Extender: Eli Hose in Treatment: 22 Subjective Chief Complaint Information obtained from  Patient Pressure ulcer right heel and left LE and ankle ulcers History of Present Illness (HPI) 09/21/2020 upon evaluation today patient appears to be doing somewhat poorly in regard to a wound in the right lateral heel location. This fortunately does not appear to be too bad but nonetheless is definitely a spot that we need to work on try to get healed for him. Has been here for quite some time. Fortunately there does not appear to be any evidence of active infection at this time which is great news. With that being said I do believe that he has some evidence here of peripheral vascular disease this is minimal his ABI 0.87. Other than that he does have a history of BPH, chronic kidney disease stage III, and hypertension. Currently he has been using different medication such as Silvadene or antibiotic ointment to try to help treat things. This just is not helping out as efficiently as it should be. Of note patient's history includes that of a triple bypass in 2010, a kyphoplasty at L1 which was done 8 months ago, unfortunately he had a fracture 2 months ago which she sustained a T7/T8 fracture to his spine which ended up with him being in the hospital. It is in that time since then that he developed this wound roughly 2 to 3 months ago is not exactly sure when. He does typically wear compression socks in the 20 to 30 mmHg range. 09/28/2020 upon evaluation today patient presents for follow-up and overall seems to be doing more poorly based on what I am seeing currently. His ABI last time was measured at 0.87 but I am beginning to think this may be falsely elevated due to the fact that there is more eschar and subsequently a concern here of  vascular compromise that may be part of her issue. I think that we need to avoid any additional sharp debridement and make a referral to vascular surgery as soon as possible here. Subsequently also went ahead and did discuss the possibility of starting the patient on  Santyl instead of the collagen in order to help clean up the surface of the wound this will take the place of sharp debridement currently. 10/19/2020 upon evaluation today patient's wound actually appears to be doing okay although he has an area on the medial aspect of his heel that is only been showing up for the past couple of days this is more of a blister that has ruptured. The good news is there is no deep wound here which is good. Fortunately there is no signs of active infection at this time also good news. With that being said however the blood flow was not good with an ABI of 0.67 on the right it was around 1 on the left nonetheless I think he does need to see vascular we put that referral in last week on the 12th Dr. Dellia Nims reviewed that for me and made that recommendations. Nonetheless the patient has not heard anything from vascular yet as far as getting this scheduled. I think this needs to be done sooner rather than later as I am concerned about the possibility of a limb threatening situation if we do not get this under control.. 11/16/2020 upon evaluation today patient presents for follow-up regarding issues that he has been having with his bilateral lower extremities in particular. He in fact still has an issue with his right heel. This appears to be a stage III pressure ulcer at this point there is some slough noted. Subsequently he does have revascularization that is taking place on the right leg as a follow-up later this month in that regard. No repeat ABIs. Subsequently the patient also has venous stasis bilaterally which is also problematic for him at this time. He has some ulcers on the left ankle and left leg that are getting need to be addressed as well. 12/21/2020 upon evaluation today patient appears to be doing poorly in regard to his bilateral lower extremity ulcers. He still has areas on both lower extremities currently ineffective even some areas that were not there  previous. Fortunately there is no signs of active infection at this time. No fevers, chills, nausea, vomiting, or diarrhea. 01/04/21 upon evaluation today patient actually appears to be doing quite well in regard to his wounds. Fortunately there does not appear to be any signs of active infection which is great. There is some necrotic tissue that I think would be helpful to debride away pretty much on all wounds other than the heel which appears to be doing quite well. 01/25/2021 upon evaluation today patient appears to be doing okay in regard to his wounds. There are some measurements that are definitely smaller which is good news. Fortunately I do not see any evidence of active infection locally nor systemically at this point which is also great news. He does have a deep tissue injury which seems to be getting a bit worse on the lateral portion of his right heel. He tells me is not been using the bunny boots and he has not been using a pillow under his calves either. Nonetheless I think this is absolutely a pressure injury and I am very concerned about this. Also I do not want this to get a lot worse. He also tells me his  hemoglobin is around 7 he is going to be seeing hematology for a transfusion either today or tomorrow most likely this will definitely help with his healing. 02/01/2021 upon evaluation today patient actually appears to be making excellent progress in regard to his wound currently. Fortunately I feel like that all the wounds are showing signs of improvement the Iodoflex seems to be doing an awesome job. I am very pleased with where we stand today. There does not appear to be any signs of active infection. 02/22/2021 upon evaluation today patient appears to be doing well with regard to his wound. He has been tolerating the dressing changes without complication. Fortunately there does not appear to be any evidence of active infection locally or systemically at this point which is good news.  Overall I think that we are headed in the right direction and the Iodoflex is doing a good job. Objective Constitutional Well-nourished and well-hydrated in no acute distress. Vitals Time Taken: 3:05 PM, Height: 72 in, Weight: 223 lbs, BMI: 30.2, Temperature: 97.9 F, Pulse: 58 bpm, Respiratory Rate: 20 breaths/min, Blood Pressure: 126/65 mmHg. Respiratory normal breathing without difficulty. Psychiatric this patient is able to make decisions and demonstrates good insight into disease process. Alert and Oriented x 3. pleasant and cooperative. General Notes: Upon inspection patient's wound bed showed signs of all across the board of doing quite well. I am extremely pleased with where we stand there is no significant bleeding which is great news and in general I think there were making good progress here. No fevers, chills, nausea, vomiting, or diarrhea. Integumentary (Hair, Skin) Wound #1 status is Open. Original cause of wound was Pressure Injury. The date acquired was: 06/08/2020. The wound has been in treatment 22 weeks. The wound is located on the Right,Lateral Calcaneus. The wound measures 0.6cm length x 1cm width x 0.1cm depth; 0.471cm^2 area and 0.047cm^3 volume. There is Fat Layer (Subcutaneous Tissue) exposed. There is no tunneling or undermining noted. There is a medium amount of serosanguineous drainage noted. The wound margin is distinct with the outline attached to the wound base. There is no granulation within the wound bed. There is a large (67-100%) amount of necrotic tissue within the wound bed including Adherent Slough. Wound #2 status is Open. Original cause of wound was Pressure Injury. The date acquired was: 10/17/2020. The wound has been in treatment 18 weeks. The wound is located on the Right,Medial Calcaneus. The wound measures 1.4cm length x 3.1cm width x 0.1cm depth; 3.409cm^2 area and 0.341cm^3 volume. There is Fat Layer (Subcutaneous Tissue) exposed. There is no  tunneling or undermining noted. There is a medium amount of serosanguineous drainage noted. The wound margin is distinct with the outline attached to the wound base. There is large (67-100%) red, pink, friable granulation within the wound bed. There is a small (1-33%) amount of necrotic tissue within the wound bed including Adherent Slough. Wound #3 status is Open. Original cause of wound was Gradually Appeared. The date acquired was: 10/12/2020. The wound has been in treatment 14 weeks. The wound is located on the Left,Lateral Ankle. The wound measures 1.9cm length x 1.3cm width x 0.6cm depth; 1.94cm^2 area and 1.164cm^3 volume. There is Fat Layer (Subcutaneous Tissue) exposed. There is no tunneling or undermining noted. There is a medium amount of serosanguineous drainage noted. The wound margin is distinct with the outline attached to the wound base. There is small (1-33%) red granulation within the wound bed. There is a large (67-100%) amount of necrotic tissue  within the wound bed including Eschar and Adherent Slough. Wound #4 status is Open. Original cause of wound was Other Lesion. The date acquired was: 11/16/2020. The wound has been in treatment 14 weeks. The wound is located on the Left,Medial Lower Leg. The wound measures 1cm length x 0.8cm width x 0.5cm depth; 0.628cm^2 area and 0.314cm^3 volume. There is Fat Layer (Subcutaneous Tissue) exposed. There is no tunneling or undermining noted. There is a medium amount of serosanguineous drainage noted. The wound margin is well defined and not attached to the wound base. There is large (67-100%) red, pink granulation within the wound bed. There is a small (1-33%) amount of necrotic tissue within the wound bed including Adherent Slough. Wound #6 status is Open. Original cause of wound was Gradually Appeared. The date acquired was: 11/22/2020. The wound has been in treatment 9 weeks. The wound is located on the Right,Lateral Ankle. The wound  measures 1.4cm length x 0.7cm width x 0.6cm depth; 0.77cm^2 area and 0.462cm^3 volume. There is Fat Layer (Subcutaneous Tissue) exposed. There is no tunneling or undermining noted. There is a medium amount of serosanguineous drainage noted. The wound margin is distinct with the outline attached to the wound base. There is large (67-100%) red, pink granulation within the wound bed. There is a small (1- 33%) amount of necrotic tissue within the wound bed including Adherent Slough. 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 Margarita Grizzle Wednesday and [name] Room # 8 ***Extra time 60 minutes*** Other: - [employer]- supplies. Bathing/ Shower/ Hygiene: May shower and wash wound with soap and water. - with dressing changes. Edema Control - Lymphedema / SCD / Other: Elevate legs to the level of the heart or above for 30 minutes daily and/or when sitting, a frequency of: - throughout the day. Avoid standing for long periods of time. Moisturize legs daily. - both legs and feet with dressing changes. Off-Loading: Other: - Heel cup to left heel for protection. bunny boots to both feet while resting in bed or chair. If unable to use bunny boots use pillow to elevate both legs to offload pressure to heels. Ensure no pressure to wounds to aid in healing. Additional Orders / Instructions: Other: - May continue to walk and work with physical therapy. WOUND #1: - Calcaneus Wound Laterality: Right, Lateral Cleanser: Soap and Water Every Other  Day/30 Days Discharge Instructions: May shower and wash wound with dial antibacterial soap and water prior to dressing change. Prim Dressing: Iodosorb Gel 10 (gm) Tube (Generic) Every Other Day/30 Days ary Discharge Instructions: Apply to wound bed as instructed Secondary Dressing: Woven Gauze Sponge, Non-Sterile 4x4 in (Generic) Every Other Day/30 Days Discharge Instructions: Apply over primary dressing as directed. Secondary Dressing: ALLEVYN Heel 4 1/2in x 5 1/2in / 10.5cm x 13.5cm (Generic) Every Other Day/30 Days Discharge Instructions: Apply over primary dressing as directed. Secured With: [employer], 4.5x3.1 (in/yd) (Generic) Every Other Day/30 Days Discharge Instructions: Secure with Kerlix as directed. Secured With: 72M Medipore H Soft Cloth Surgical T ape, 4 x 10 (in/yd) (Generic) Every Other Day/30 Days Discharge Instructions: Secure with tape as directed. WOUND #2: -  Calcaneus Wound Laterality: Right, Medial Cleanser: Soap and Water Every Other Day/30 Days Discharge Instructions: May shower and wash wound with dial antibacterial soap and water prior to dressing change. Prim Dressing: Iodosorb Gel 10 (gm) Tube (Generic) Every Other Day/30 Days ary Discharge Instructions: Apply to wound bed as instructed Secondary Dressing: Woven Gauze Sponge, Non-Sterile 4x4 in (Generic) Every Other Day/30 Days Discharge Instructions: Apply over primary dressing as directed. Secondary Dressing: ALLEVYN Heel 4 1/2in x 5 1/2in / 10.5cm x 13.5cm (Generic) Every Other Day/30 Days Discharge Instructions: Apply over primary dressing as directed. Secured With: [employer], 4.5x3.1 (in/yd) (Generic) Every Other Day/30 Days Discharge Instructions: Secure with Kerlix as directed. Secured With: 18M Medipore H Soft Cloth Surgical T ape, 4 x 10 (in/yd) (Generic) Every Other Day/30 Days Discharge Instructions: Secure with tape as directed. WOUND #3: - Ankle Wound Laterality: Left,  Lateral Cleanser: Soap and Water Every Other Day/30 Days Discharge Instructions: May shower and wash wound with dial antibacterial soap and water prior to dressing change. Prim Dressing: Iodosorb Gel 10 (gm) Tube Every Other Day/30 Days ary Discharge Instructions: Apply to wound bed as instructed Secondary Dressing: Zetuvit Plus Silicone Border Dressing 4x4 (in/in) (Generic) Every Other Day/30 Days Discharge Instructions: Apply silicone border over primary dressing as directed. WOUND #4: - Lower Leg Wound Laterality: Left, Medial Cleanser: Soap and Water Every Other Day/30 Days Discharge Instructions: May shower and wash wound with dial antibacterial soap and water prior to dressing change. Prim Dressing: Iodosorb Gel 10 (gm) Tube (Generic) Every Other Day/30 Days ary Discharge Instructions: Apply to wound bed as instructed Secondary Dressing: Zetuvit Plus Silicone Border Dressing 4x4 (in/in) (Generic) Every Other Day/30 Days Discharge Instructions: Apply silicone border over primary dressing as directed. WOUND #6: - Ankle Wound Laterality: Right, Lateral Cleanser: Soap and Water Every Other Day/30 Days Discharge Instructions: May shower and wash wound with dial antibacterial soap and water prior to dressing change. Prim Dressing: Iodosorb Gel 10 (gm) Tube Every Other Day/30 Days ary Discharge Instructions: Apply to wound bed as instructed Secondary Dressing: Zetuvit Plus Silicone Border Dressing 4x4 (in/in) (Generic) Every Other Day/30 Days Discharge Instructions: Apply silicone border over primary dressing as directed. 1. Would recommend that we going to continue with the Iodoflex across the board except for underneath the right great toe regular use a little bit of the Xeroform gauze here which will keep it moist and hopefully allow this to heal without splitting again it looks like that is exactly what happened is this area underneath just split open. 2. I am also can recommend that we  have the patient continue with the border foam dressing on the right heel we will use just large Band-Aids on everything else. 3. I am also can recommend continuing use of the heel cup for the left heel. We will see patient back for reevaluation in 2 weeks here in the clinic. If anything worsens or changes patient will contact our office for additional recommendations. Electronic Signature(s) Signed: 02/22/2021 3:44:07 PM By: [name] PA-C Entered By: [name] on 02/22/2021 15:44:06 -------------------------------------------------------------------------------- SuperBill Details Patient Name: Date of Service: [name], [name] 02/22/2021 Medical Record Number: [id number] Patient Account Number: [id number] Date of Birth/Sex: Treating RN: [birth year] (88 y.o. M) Primary Care Provider: [name] Other Clinician: Referring Provider: Treating Provider/Extender: Eli Hose in Treatment: 22 Diagnosis Coding ICD-10 Codes Code Description [phone] Pressure ulcer of right heel, stage 3 I73.89 Other  specified peripheral vascular diseases I87.2 Venous insufficiency (chronic) (peripheral) L97.822 Non-pressure chronic ulcer of other part of left lower leg with fat layer exposed L97.312 Non-pressure chronic ulcer of right ankle with fat layer exposed N40.1 Benign prostatic hyperplasia with lower urinary tract symptoms N18.30 Chronic kidney disease, stage 3 unspecified I10 Essential (primary) hypertension Facility Procedures CPT4 Code: [id number] Description: 46659 - WOUND CARE VISIT-LEV 5 EST PT Modifier: Quantity: 1 Physician Procedures : CPT4 Code Description Modifier [id number] - WC PHYS LEVEL 4 - EST PT ICD-10 Diagnosis Description L89.613 Pressure ulcer of right heel, stage 3 I73.89 Other specified peripheral vascular diseases I87.2 Venous insufficiency (chronic) (peripheral)  L97.822 Non-pressure chronic ulcer of other part of left lower leg with  fat layer exposed Quantity: 1 Electronic Signature(s) Signed: 02/22/2021 5:31:46 PM By: Rhae Hammock RN Signed: 02/28/2021 12:04:32 PM By: [name] PA-C Previous Signature: 02/22/2021 3:44:28 PM Version By: [name] PA-C Entered By: Rhae Hammock on 02/22/2021 17:18:53

## 2021-02-24 ENCOUNTER — Encounter (HOSPITAL_COMMUNITY): Payer: Medicare Other

## 2021-02-24 ENCOUNTER — Ambulatory Visit: Payer: Medicare Other

## 2021-02-26 DIAGNOSIS — I259 Chronic ischemic heart disease, unspecified: Secondary | ICD-10-CM | POA: Diagnosis not present

## 2021-02-26 DIAGNOSIS — G4733 Obstructive sleep apnea (adult) (pediatric): Secondary | ICD-10-CM | POA: Diagnosis not present

## 2021-02-26 DIAGNOSIS — I4891 Unspecified atrial fibrillation: Secondary | ICD-10-CM | POA: Diagnosis not present

## 2021-02-26 DIAGNOSIS — I5021 Acute systolic (congestive) heart failure: Secondary | ICD-10-CM | POA: Diagnosis not present

## 2021-02-27 NOTE — Progress Notes (Signed)
[name] [name] IMarland Kitchen ([id number]) Visit Report for 02/22/2021 Arrival Information Details Patient Name: Date of Service: [name] [name] ID I. 02/22/2021 2:45 PM Medical Record Number: [id number] Patient Account Number: [id number] Date of Birth/Sex: Treating RN: 26-Feb-1926 (88 y.o. [name] Primary Care Allecia Bells: [name] [name] Other Clinician: Referring Decoda Van: Treating Avis Mcmahill/Extender: [name] [name] in Treatment: 39 Visit Information History Since Last Visit Added or deleted any medications: No Patient Arrived: Walker Any new allergies or adverse reactions: No Arrival Time: 15:08 Had a fall or experienced change in No Accompanied By: wife activities of daily living that may affect Transfer Assistance: Manual risk of falls: Patient Identification Verified: Yes Signs or symptoms of abuse/neglect since last visito No Secondary Verification Process Completed: Yes Hospitalized since last visit: No Patient Requires Transmission-Based Precautions: No Implantable device outside of the clinic excluding No Patient Has Alerts: Yes cellular tissue based products placed in the center Patient Alerts: Patient on Blood Thinner since last visit: Has Dressing in Place as Prescribed: Yes Pain Present Now: No Electronic Signature(s) Signed: 02/22/2021 6:06:00 PM By: [name] Pilling RN, BSN Entered By: [name] [name] on 02/22/2021 15:08:57 -------------------------------------------------------------------------------- Clinic Level of Care Assessment Details Patient Name: Date of Service: [name] [name] ID I. 02/22/2021 2:45 PM Medical Record Number: [id number] Patient Account Number: [id number] Date of Birth/Sex: Treating RN: January 12, 1926 (88 y.o. [name] [name], [name] [name] Primary Care Corban Kistler: [name] [name] Other Clinician: Referring Latunya Kissick: Treating Deette Revak/Extender: [name] [name] in Treatment: 22 Clinic Level of Care Assessment Items TOOL 4 Quantity  Score X- 1 0 Use when only an EandM is performed on FOLLOW-UP visit ASSESSMENTS - Nursing Assessment / Reassessment X- 1 10 Reassessment of Co-morbidities (includes updates in patient status) X- 1 5 Reassessment of Adherence to Treatment Plan ASSESSMENTS - Wound and Skin A ssessment / Reassessment '[]'  - 0 Simple Wound Assessment / Reassessment - one wound X- 6 5 Complex Wound Assessment / Reassessment - multiple wounds '[]'  - 0 Dermatologic / Skin Assessment (not related to wound area) ASSESSMENTS - Focused Assessment X- 2 5 Circumferential Edema Measurements - multi extremities '[]'  - 0 Nutritional Assessment / Counseling / Intervention '[]'  - 0 Lower Extremity Assessment (monofilament, tuning fork, pulses) '[]'  - 0 Peripheral Arterial Disease Assessment (using hand held doppler) ASSESSMENTS - Ostomy and/or Continence Assessment and Care '[]'  - 0 Incontinence Assessment and Management '[]'  - 0 Ostomy Care Assessment and Management (repouching, etc.) PROCESS - Coordination of Care '[]'  - 0 Simple Patient / Family Education for ongoing care X- 1 20 Complex (extensive) Patient / Family Education for ongoing care X- 1 10 Staff obtains Programmer, systems, Records, T Results / Process Orders est X- 1 10 Staff telephones HHA, Nursing Homes / Clarify orders / etc '[]'  - 0 Routine Transfer to another Facility (non-emergent condition) '[]'  - 0 Routine Hospital Admission (non-emergent condition) '[]'  - 0 New Admissions / Biomedical engineer / Ordering NPWT Apligraf, etc. , '[]'  - 0 Emergency Hospital Admission (emergent condition) '[]'  - 0 Simple Discharge Coordination X- 1 15 Complex (extensive) Discharge Coordination PROCESS - Special Needs '[]'  - 0 Pediatric / Minor Patient Management '[]'  - 0 Isolation Patient Management '[]'  - 0 Hearing / Language / Visual special needs '[]'  - 0 Assessment of Community assistance (transportation, D/C planning, etc.) '[]'  - 0 Additional assistance / Altered  mentation '[]'  - 0 Support Surface(s) Assessment (bed, cushion, seat, etc.) INTERVENTIONS - Wound Cleansing / Measurement '[]'  - 0 Simple Wound  Cleansing - one wound X- 6 5 Complex Wound Cleansing - multiple wounds X- 1 5 Wound Imaging (photographs - any number of wounds) '[]'  - 0 Wound Tracing (instead of photographs) '[]'  - 0 Simple Wound Measurement - one wound X- 6 5 Complex Wound Measurement - multiple wounds INTERVENTIONS - Wound Dressings '[]'  - 0 Small Wound Dressing one or multiple wounds '[]'  - 0 Medium Wound Dressing one or multiple wounds X- 6 20 Large Wound Dressing one or multiple wounds X- 1 5 Application of Medications - topical '[]'  - 0 Application of Medications - injection INTERVENTIONS - Miscellaneous '[]'  - 0 External ear exam '[]'  - 0 Specimen Collection (cultures, biopsies, blood, body fluids, etc.) '[]'  - 0 Specimen(s) / Culture(s) sent or taken to Lab for analysis '[]'  - 0 Patient Transfer (multiple staff / Civil Service fast streamer / Similar devices) '[]'  - 0 Simple Staple / Suture removal (25 or less) '[]'  - 0 Complex Staple / Suture removal (26 or more) '[]'  - 0 Hypo / Hyperglycemic Management (close monitor of Blood Glucose) '[]'  - 0 Ankle / Brachial Index (ABI) - do not check if billed separately X- 1 5 Vital Signs Has the patient been seen at the hospital within the last three years: Yes Total Score: 305 Level Of Care: New/Established - Level 5 Electronic Signature(s) Signed: 02/22/2021 5:31:46 PM By: Rhae Hammock RN Entered By: Rhae Hammock on 02/22/2021 17:18:42 -------------------------------------------------------------------------------- Encounter Discharge Information Details Patient Name: Date of Service: [name] [name], [name] 02/22/2021 2:45 PM Medical Record Number: [id number] Patient Account Number: [id number] Date of Birth/Sex: Treating RN: [birth year] (88 y.o. [name] [name], [name] [name] Primary Care Sinan Tuch: [name] [name] Other Clinician: Referring  Koleton Duchemin: Treating Rutger Salton/Extender: [name] [name] in Treatment: 22 Encounter Discharge Information Items Discharge Condition: Stable Ambulatory Status: Ambulatory Discharge Destination: Home Transportation: Private Auto Accompanied By: wife Schedule Follow-up Appointment: Yes Clinical Summary of Care: Patient Declined Electronic Signature(s) Signed: 02/22/2021 5:31:46 PM By: Rhae Hammock RN Entered By: Rhae Hammock on 02/22/2021 17:19:56 -------------------------------------------------------------------------------- Lower Extremity Assessment Details Patient Name: Date of Service: [name] [name] ID I. 02/22/2021 2:45 PM Medical Record Number: [id number] Patient Account Number: [id number] Date of Birth/Sex: Treating RN: 1926/02/20 (88 y.o. [name] Primary Care Tanae Petrosky: [name] [name] Other Clinician: Referring Dietrich Samuelson: Treating Garl Speigner/Extender: [name] in Treatment: 22 Edema Assessment Assessed: [Left: Yes] Patrice Paradise: Yes] Edema: [Left: Yes] [Right: Yes] Calf Left: Right: Point of Measurement: 34 cm From Medial Instep 42.5 cm 44.5 cm Ankle Left: Right: Point of Measurement: 11 cm From Medial Instep 27 cm 30.5 cm Vascular Assessment Pulses: Dorsalis Pedis Palpable: [Left:No] [Right:No] Electronic Signature(s) Signed: 02/22/2021 6:06:00 PM By: [name] Pilling RN, BSN Entered By: [name] [name] on 02/22/2021 15:11:58 -------------------------------------------------------------------------------- Multi-Disciplinary Care Plan Details Patient Name: Date of Service: [name] [name], [name] 02/22/2021 2:45 PM Medical Record Number: [id number] Patient Account Number: [id number] Date of Birth/Sex: Treating RN: 02-Jul-1926 (88 y.o. [name] Primary Care Jos Cygan: [name] [name] Other Clinician: Referring Rishit Burkhalter: Treating Fabion Gatson/Extender: [name] [name] in Treatment: 22 Active  Inactive Tissue Oxygenation Nursing Diagnoses: Potential alteration in peripheral tissue perfusion (select prior to confirmation of diagnosis) Goals: Non-invasive arterial studies are completed as ordered Date Initiated: 09/21/2020 Date Inactivated: 10/19/2020 Target Resolution Date: 09/21/2020 Goal Status: Met Revascularization procedures completed as ordered Date Initiated: 09/21/2020 Target Resolution Date: 03/24/2021 Goal Status: Active Interventions: Assess patient understanding of disease process and management  upon diagnosis and as needed Provide education on tissue oxygenation and ischemia Treatment Activities: Ankle Brachial Index (ABI) : 09/21/2020 Non-invasive vascular studies : 09/21/2020 T ordered outside of clinic : 09/21/2020 est Notes: 10/19/20: Had vascular testing, awaiting appointment to see vascular MD Wound/Skin Impairment Nursing Diagnoses: Knowledge deficit related to ulceration/compromised skin integrity Goals: Patient/caregiver will verbalize understanding of skin care regimen Date Initiated: 09/21/2020 Target Resolution Date: 03/31/2021 Goal Status: Active Interventions: Assess patient/caregiver ability to obtain necessary supplies Assess patient/caregiver ability to perform ulcer/skin care regimen upon admission and as needed Provide education on ulcer and skin care Treatment Activities: Skin care regimen initiated : 09/21/2020 Topical wound management initiated : 09/21/2020 Notes: Electronic Signature(s) Signed: 02/22/2021 6:06:00 PM By: [name] Pilling RN, BSN Entered By: [name] [name] on 02/22/2021 15:31:44 -------------------------------------------------------------------------------- Pain Assessment Details Patient Name: Date of Service: [name] [name] ID I. 02/22/2021 2:45 PM Medical Record Number: [id number] Patient Account Number: [id number] Date of Birth/Sex: Treating RN: [birth year] (88 y.o. [name] Primary Care Clyde Zarrella: [name] [name] Other Clinician: Referring Shivangi Lutz: Treating Muhammed Teutsch/Extender: [name] [name] in Treatment: 22 Active Problems Location of Pain Severity and Description of Pain Patient Has Paino No Site Locations Rate the pain. Current Pain Level: 0 Pain Management and Medication Current Pain Management: Medication: No Cold Application: No Rest: No Massage: No Activity: No T.E.N.S.: No Heat Application: No Leg drop or elevation: No Is the Current Pain Management Adequate: Adequate How does your wound impact your activities of daily livingo Sleep: No Bathing: No Appetite: No Relationship With Others: No Bladder Continence: No Emotions: No Bowel Continence: No Work: No Toileting: No Drive: No Dressing: No Hobbies: No Engineer, maintenance) Signed: 02/22/2021 6:06:00 PM By: [name] Pilling RN, BSN Entered By: [name] [name] on 02/22/2021 15:09:24 -------------------------------------------------------------------------------- Patient/Caregiver Education Details Patient Name: Date of Service: [name] [name] ID I. 2/22/2023andnbsp2:45 PM Medical Record Number: [id number] Patient Account Number: [id number] Date of Birth/Gender: Treating RN: 1926-04-20 (88 y.o. [name] Primary Care Physician: [name] [name] Other Clinician: Referring Physician: Treating Physician/Extender: [name] [name] in Treatment: 22 Education Assessment Education Provided To: Patient and Caregiver Education Topics Provided Wound/Skin Impairment: Handouts: Skin Care Do's and Dont's Methods: Explain/Verbal Responses: Reinforcements needed Electronic Signature(s) Signed: 02/22/2021 6:06:00 PM By: [name] Pilling RN, BSN Entered By: [name] [name] on 02/22/2021 15:32:01 -------------------------------------------------------------------------------- Wound Assessment Details Patient Name: Date of Service: [name] [name] ID I. 02/22/2021 2:45 PM Medical Record  Number: [id number] Patient Account Number: [id number] Date of Birth/Sex: Treating RN: Feb 14, 1926 (88 y.o. [name], [name] Primary Care Earvin Blazier: [name] [name] Other Clinician: Referring Ivyanna Sibert: Treating Elyssia Strausser/Extender: [name] in Treatment: 22 Wound Status Wound Number: 1 Primary Pressure Ulcer Etiology: Wound Location: Right, Lateral Calcaneus Wound Open Wounding Event: Pressure Injury Status: Date Acquired: 06/08/2020 Comorbid Cataracts, Sleep Apnea, Arrhythmia, Congestive Heart Failure, Weeks Of Treatment: 22 History: Coronary Artery Disease, Hypertension, Myocardial Infarction, Clustered Wound: No End Stage Renal Disease, Gout, Dementia, Neuropathy Photos Wound Measurements Length: (cm) 0.6 Width: (cm) 1 Depth: (cm) 0.1 Area: (cm) 0.471 Volume: (cm) 0.047 % Reduction in Area: 4.8% % Reduction in Volume: 4.1% Epithelialization: Medium (34-66%) Tunneling: No Undermining: No Wound Description Classification: Category/Stage III Wound Margin: Distinct, outline attached Exudate Amount: Medium Exudate Type: Serosanguineous Exudate Color: red, brown Foul Odor After Cleansing: No Slough/Fibrino Yes Wound Bed Granulation Amount: None Present (0%) Exposed Structure Necrotic Amount: Large (67-100%) Fascia Exposed: No  Necrotic Quality: Adherent Slough Fat Layer (Subcutaneous Tissue) Exposed: Yes Tendon Exposed: No Muscle Exposed: No Joint Exposed: No Bone Exposed: No Treatment Notes Wound #1 (Calcaneus) Wound Laterality: Right, Lateral Cleanser Soap and Water Discharge Instruction: May shower and wash wound with dial antibacterial soap and water prior to dressing change. Peri-Wound Care Topical Primary Dressing Iodosorb Gel 10 (gm) Tube Discharge Instruction: Apply to wound bed as instructed Secondary Dressing Woven Gauze Sponge, Non-Sterile 4x4 in Discharge Instruction: Apply over primary dressing as directed. Zetuvit Plus  Silicone Border Dressing 4x4 (in/in) Discharge Instruction: Apply silicone border over primary dressing as directed. Secured With [employer], 4.5x3.1 (in/yd) Discharge Instruction: Secure with Kerlix as directed. 76M Medipore H Soft Cloth Surgical T ape, 4 x 10 (in/yd) Discharge Instruction: Secure with tape as directed. Compression Wrap Compression Stockings Add-Ons Electronic Signature(s) Signed: 02/22/2021 6:06:00 PM By: [name] Pilling RN, BSN Signed: 02/27/2021 11:32:19 AM By: Sharyn Creamer RN, BSN Entered By: Sharyn Creamer on 02/22/2021 15:25:35 -------------------------------------------------------------------------------- Wound Assessment Details Patient Name: Date of Service: [name] [name], Shaune [name] ID I. 02/22/2021 2:45 PM Medical Record Number: [id number] Patient Account Number: [id number] Date of Birth/Sex: Treating RN: 02-17-1926 (88 y.o. [name], [name] Primary Care Kalei Mckillop: [name] [name] Other Clinician: Referring Michale Emmerich: Treating Cleva Camero/Extender: [name] in Treatment: 22 Wound Status Wound Number: 2 Primary Pressure Ulcer Etiology: Wound Location: Right, Medial Calcaneus Wound Open Wounding Event: Pressure Injury Status: Date Acquired: 10/17/2020 Comorbid Cataracts, Sleep Apnea, Arrhythmia, Congestive Heart Failure, Weeks Of Treatment: 18 Weeks Of Treatment: 18 History: Coronary Artery Disease, Hypertension, Myocardial Infarction, Clustered Wound: No End Stage Renal Disease, Gout, Dementia, Neuropathy Photos Wound Measurements Length: (cm) 1.4 Width: (cm) 3.1 Depth: (cm) 0.1 Area: (cm) 3.409 Volume: (cm) 0.341 % Reduction in Area: -261.9% % Reduction in Volume: -262.8% Epithelialization: Medium (34-66%) Tunneling: No Undermining: No Wound Description Classification: Category/Stage III Wound Margin: Distinct, outline attached Exudate Amount: Medium Exudate Type: Serosanguineous Exudate Color: red, brown Foul Odor  After Cleansing: No Slough/Fibrino Yes Wound Bed Granulation Amount: Large (67-100%) Exposed Structure Granulation Quality: Red, Pink, Friable Fascia Exposed: No Necrotic Amount: Small (1-33%) Fat Layer (Subcutaneous Tissue) Exposed: Yes Necrotic Quality: Adherent Slough Tendon Exposed: No Muscle Exposed: No Joint Exposed: No Bone Exposed: No Treatment Notes Wound #2 (Calcaneus) Wound Laterality: Right, Medial Cleanser Soap and Water Discharge Instruction: May shower and wash wound with dial antibacterial soap and water prior to dressing change. Peri-Wound Care Topical Primary Dressing Iodosorb Gel 10 (gm) Tube Discharge Instruction: Apply to wound bed as instructed Secondary Dressing Woven Gauze Sponge, Non-Sterile 4x4 in Discharge Instruction: Apply over primary dressing as directed. Zetuvit Plus Silicone Border Dressing 4x4 (in/in) Discharge Instruction: Apply silicone border over primary dressing as directed. Secured With [employer], 4.5x3.1 (in/yd) Discharge Instruction: Secure with Kerlix as directed. 76M Medipore H Soft Cloth Surgical T ape, 4 x 10 (in/yd) Discharge Instruction: Secure with tape as directed. Compression Wrap Compression Stockings Add-Ons Electronic Signature(s) Signed: 02/22/2021 6:06:00 PM By: [name] Pilling RN, BSN Signed: 02/27/2021 11:32:19 AM By: Sharyn Creamer RN, BSN Entered By: Sharyn Creamer on 02/22/2021 15:26:09 -------------------------------------------------------------------------------- Wound Assessment Details Patient Name: Date of Service: [name] [name], Shaune [name] ID I. 02/22/2021 2:45 PM Medical Record Number: [id number] Patient Account Number: [id number] Date of Birth/Sex: Treating RN: December 21, 1926 (88 y.o. [name] Primary Care Kayton Ripp: [name] [name] Other Clinician: Referring Awilda Covin: Treating Ivah Girardot/Extender: [name] in Treatment: 22 Wound Status Wound Number: 3  Primary Venous Leg  Ulcer Etiology: Wound Location: Left, Lateral Ankle Wound Open Wounding Event: Gradually Appeared Status: Date Acquired: 10/12/2020 Comorbid Cataracts, Sleep Apnea, Arrhythmia, Congestive Heart Failure, Weeks Of Treatment: 14 History: Coronary Artery Disease, Hypertension, Myocardial Infarction, Clustered Wound: No End Stage Renal Disease, Gout, Dementia, Neuropathy Photos Wound Measurements Length: (cm) 1.9 Width: (cm) 1.3 Depth: (cm) 0.6 Area: (cm) 1.94 Volume: (cm) 1.164 % Reduction in Area: -285.7% % Reduction in Volume: -670.9% Epithelialization: Small (1-33%) Tunneling: No Undermining: No Wound Description Classification: Full Thickness Without Exposed Support Structures Wound Margin: Distinct, outline attached Exudate Amount: Medium Exudate Type: Serosanguineous Exudate Color: red, brown Foul Odor After Cleansing: No Slough/Fibrino Yes Wound Bed Granulation Amount: Small (1-33%) Exposed Structure Granulation Quality: Red Fascia Exposed: No Necrotic Amount: Large (67-100%) Fat Layer (Subcutaneous Tissue) Exposed: Yes Necrotic Quality: Eschar, Adherent Slough Tendon Exposed: No Muscle Exposed: No Joint Exposed: No Bone Exposed: No Treatment Notes Wound #3 (Ankle) Wound Laterality: Left, Lateral Cleanser Soap and Water Discharge Instruction: May shower and wash wound with dial antibacterial soap and water prior to dressing change. Peri-Wound Care Topical Primary Dressing Iodosorb Gel 10 (gm) Tube Discharge Instruction: Apply to wound bed as instructed Secondary Dressing Woven Gauze Sponge, Non-Sterile 4x4 in Discharge Instruction: Apply over primary dressing as directed. Zetuvit Plus Silicone Border Dressing 4x4 (in/in) Discharge Instruction: Apply silicone border over primary dressing as directed. Secured With [employer], 4.5x3.1 (in/yd) Discharge Instruction: Secure with Kerlix as directed. 71M Medipore H Soft Cloth Surgical T ape, 4 x 10  (in/yd) Discharge Instruction: Secure with tape as directed. Compression Wrap Compression Stockings Add-Ons Electronic Signature(s) Signed: 02/22/2021 6:06:00 PM By: [name] Pilling RN, BSN Signed: 02/27/2021 11:32:19 AM By: Sharyn Creamer RN, BSN Entered By: Sharyn Creamer on 02/22/2021 15:27:06 -------------------------------------------------------------------------------- Wound Assessment Details Patient Name: Date of Service: [name] [name], Shaune [name] ID I. 02/22/2021 2:45 PM Medical Record Number: [id number] Patient Account Number: [id number] Date of Birth/Sex: Treating RN: 1926/01/27 (88 y.o. [name], [name] Primary Care Jasamine Pottinger: [name] [name] Other Clinician: Referring Ronisha Herringshaw: Treating Aikam Hellickson/Extender: [name] in Treatment: 22 Wound Status Wound Number: 4 Primary Abscess Etiology: Wound Location: Left, Medial Lower Leg Wound Open Wounding Event: Other Lesion Status: Date Acquired: 11/16/2020 Comorbid Cataracts, Sleep Apnea, Arrhythmia, Congestive Heart Failure, Weeks Of Treatment: 14 History: Coronary Artery Disease, Hypertension, Myocardial Infarction, Clustered Wound: No End Stage Renal Disease, Gout, Dementia, Neuropathy Photos Wound Measurements Length: (cm) 1 Width: (cm) 0.8 Depth: (cm) 0.5 Area: (cm) 0.628 Volume: (cm) 0.314 % Reduction in Area: -220.4% % Reduction in Volume: -77.4% Epithelialization: Small (1-33%) Tunneling: No Undermining: No Wound Description Classification: Full Thickness Without Exposed Support Structures Wound Margin: Well defined, not attached Exudate Amount: Medium Exudate Type: Serosanguineous Exudate Color: red, brown Foul Odor After Cleansing: No Slough/Fibrino Yes Wound Bed Granulation Amount: Large (67-100%) Exposed Structure Granulation Quality: Red, Pink Fascia Exposed: No Necrotic Amount: Small (1-33%) Fat Layer (Subcutaneous Tissue) Exposed: Yes Necrotic Quality: Adherent Slough Tendon  Exposed: No Muscle Exposed: No Joint Exposed: No Bone Exposed: No Treatment Notes Wound #4 (Lower Leg) Wound Laterality: Left, Medial Cleanser Soap and Water Discharge Instruction: May shower and wash wound with dial antibacterial soap and water prior to dressing change. Peri-Wound Care Topical Primary Dressing Iodosorb Gel 10 (gm) Tube Discharge Instruction: Apply to wound bed as instructed Secondary Dressing Woven Gauze Sponge, Non-Sterile 4x4 in Discharge Instruction: Apply over primary dressing as directed. Zetuvit Plus Silicone Border Dressing 4x4 (in/in) Discharge Instruction: Apply  silicone border over primary dressing as directed. Secured With [employer], 4.5x3.1 (in/yd) Discharge Instruction: Secure with Kerlix as directed. 30M Medipore H Soft Cloth Surgical T ape, 4 x 10 (in/yd) Discharge Instruction: Secure with tape as directed. Compression Wrap Compression Stockings Add-Ons Electronic Signature(s) Signed: 02/22/2021 6:06:00 PM By: [name] Pilling RN, BSN Signed: 02/27/2021 11:32:19 AM By: Sharyn Creamer RN, BSN Entered By: Sharyn Creamer on 02/22/2021 15:27:43 -------------------------------------------------------------------------------- Wound Assessment Details Patient Name: Date of Service: [name] [name], Shaune [name] ID I. 02/22/2021 2:45 PM Medical Record Number: [id number] Patient Account Number: [id number] Date of Birth/Sex: Treating RN: September 18, 1926 (88 y.o. [name], [name] Primary Care Hadiya Spoerl: [name] [name] Other Clinician: Referring Ayerim Berquist: Treating Collins Kerby/Extender: [name] in Treatment: 22 Wound Status Wound Number: 6 Primary Arterial Insufficiency Ulcer Etiology: Wound Location: Right, Lateral Ankle Wound Open Wounding Event: Gradually Appeared Status: Date Acquired: 11/22/2020 Comorbid Cataracts, Sleep Apnea, Arrhythmia, Congestive Heart Failure, Weeks Of Treatment: 9 History: Coronary Artery Disease, Hypertension,  Myocardial Infarction, Clustered Wound: No End Stage Renal Disease, Gout, Dementia, Neuropathy Photos Wound Measurements Length: (cm) 1.4 Width: (cm) 0.7 Depth: (cm) 0.6 Area: (cm) 0.77 Volume: (cm) 0.462 % Reduction in Area: -180% % Reduction in Volume: -237.2% Epithelialization: Small (1-33%) Tunneling: No Undermining: No Wound Description Classification: Full Thickness Without Exposed Support Structures Wound Margin: Distinct, outline attached Exudate Amount: Medium Exudate Type: Serosanguineous Exudate Color: red, brown Foul Odor After Cleansing: No Slough/Fibrino Yes Wound Bed Granulation Amount: Large (67-100%) Exposed Structure Granulation Quality: Red, Pink Fascia Exposed: No Necrotic Amount: Small (1-33%) Fat Layer (Subcutaneous Tissue) Exposed: Yes Necrotic Quality: Adherent Slough Tendon Exposed: No Muscle Exposed: No Joint Exposed: No Bone Exposed: No Treatment Notes Wound #6 (Ankle) Wound Laterality: Right, Lateral Cleanser Soap and Water Discharge Instruction: May shower and wash wound with dial antibacterial soap and water prior to dressing change. Peri-Wound Care Topical Primary Dressing Iodosorb Gel 10 (gm) Tube Discharge Instruction: Apply to wound bed as instructed Secondary Dressing Woven Gauze Sponge, Non-Sterile 4x4 in Discharge Instruction: Apply over primary dressing as directed. Zetuvit Plus Silicone Border Dressing 4x4 (in/in) Discharge Instruction: Apply silicone border over primary dressing as directed. Secured With [employer], 4.5x3.1 (in/yd) Discharge Instruction: Secure with Kerlix as directed. 30M Medipore H Soft Cloth Surgical Tape, 4 x 10 (in/yd) Discharge Instruction: Secure with tape as directed. Compression Wrap Compression Stockings Add-Ons Electronic Signature(s) Signed: 02/22/2021 6:06:00 PM By: [name] Pilling RN, BSN Signed: 02/27/2021 11:32:19 AM By: Sharyn Creamer RN, BSN Entered By: Sharyn Creamer on  02/22/2021 15:28:17 -------------------------------------------------------------------------------- Vitals Details Patient Name: Date of Service: [name] [name], [name] 02/22/2021 2:45 PM Medical Record Number: [id number] Patient Account Number: [id number] Date of Birth/Sex: Treating RN: 26-Apr-1926 (88 y.o. [name] Primary Care Braeson Rupe: [name] [name] Other Clinician: Referring Myasia Sinatra: Treating Kendell Sagraves/Extender: [name] [name] in Treatment: 22 Vital Signs Time Taken: 15:05 Temperature (F): 97.9 Height (in): 72 Pulse (bpm): 58 Weight (lbs): 223 Respiratory Rate (breaths/min): 20 Body Mass Index (BMI): 30.2 Blood Pressure (mmHg): 126/65 Reference Range: 80 - 120 mg / dl Electronic Signature(s) Signed: 02/22/2021 6:06:00 PM By: [name] Pilling RN, BSN Entered By: [name] [name] on 02/22/2021 15:09:10

## 2021-02-28 DIAGNOSIS — L89623 Pressure ulcer of left heel, stage 3: Secondary | ICD-10-CM | POA: Diagnosis not present

## 2021-02-28 DIAGNOSIS — L89613 Pressure ulcer of right heel, stage 3: Secondary | ICD-10-CM | POA: Diagnosis not present

## 2021-02-28 DIAGNOSIS — L97822 Non-pressure chronic ulcer of other part of left lower leg with fat layer exposed: Secondary | ICD-10-CM | POA: Diagnosis not present

## 2021-03-08 ENCOUNTER — Encounter (HOSPITAL_BASED_OUTPATIENT_CLINIC_OR_DEPARTMENT_OTHER): Payer: Medicare Other | Attending: Physician Assistant | Admitting: Physician Assistant

## 2021-03-08 ENCOUNTER — Other Ambulatory Visit: Payer: Self-pay

## 2021-03-08 DIAGNOSIS — L89613 Pressure ulcer of right heel, stage 3: Secondary | ICD-10-CM | POA: Insufficient documentation

## 2021-03-08 DIAGNOSIS — I7389 Other specified peripheral vascular diseases: Secondary | ICD-10-CM | POA: Diagnosis not present

## 2021-03-08 DIAGNOSIS — L97312 Non-pressure chronic ulcer of right ankle with fat layer exposed: Secondary | ICD-10-CM | POA: Diagnosis not present

## 2021-03-08 DIAGNOSIS — L988 Other specified disorders of the skin and subcutaneous tissue: Secondary | ICD-10-CM | POA: Diagnosis not present

## 2021-03-08 DIAGNOSIS — L97322 Non-pressure chronic ulcer of left ankle with fat layer exposed: Secondary | ICD-10-CM | POA: Diagnosis not present

## 2021-03-08 DIAGNOSIS — I872 Venous insufficiency (chronic) (peripheral): Secondary | ICD-10-CM | POA: Insufficient documentation

## 2021-03-08 DIAGNOSIS — N183 Chronic kidney disease, stage 3 unspecified: Secondary | ICD-10-CM | POA: Insufficient documentation

## 2021-03-08 DIAGNOSIS — I129 Hypertensive chronic kidney disease with stage 1 through stage 4 chronic kidney disease, or unspecified chronic kidney disease: Secondary | ICD-10-CM | POA: Insufficient documentation

## 2021-03-08 DIAGNOSIS — L97822 Non-pressure chronic ulcer of other part of left lower leg with fat layer exposed: Secondary | ICD-10-CM | POA: Insufficient documentation

## 2021-03-08 DIAGNOSIS — N401 Enlarged prostate with lower urinary tract symptoms: Secondary | ICD-10-CM | POA: Diagnosis not present

## 2021-03-08 NOTE — Progress Notes (Addendum)
VIHAN, SANTAGATA IMarland Kitchen ([id number]) Visit Report for 03/08/2021 Chief Complaint Document Details Patient Name: Date of Service: [name] ID I. 03/08/2021 2:15 PM Medical Record Number: [id number] Patient Account Number: [id number] Date of Birth/Sex: Treating RN: Dec 02, 1926 (88 y.o. [name] Primary Care Provider: [name] Other Clinician: Referring Provider: Treating Provider/Extender: Eli Hose in Treatment: 24 Information Obtained from: Patient Chief Complaint Pressure ulcer right heel and left LE and ankle ulcers Electronic Signature(s) Signed: 03/08/2021 2:45:09 PM By: [name] PA-C Entered By: [name] on 03/08/2021 14:45:08 -------------------------------------------------------------------------------- HPI Details Patient Name: Date of Service: [name], [name] 03/08/2021 2:15 PM Medical Record Number: [id number] Patient Account Number: [id number] Date of Birth/Sex: Treating RN: April 20, 1926 (88 y.o. [name] Primary Care Provider: [name] Other Clinician: Referring Provider: Treating Provider/Extender: Eli Hose in Treatment: 24 History of Present Illness HPI Description: 09/21/2020 upon evaluation today patient appears to be doing somewhat poorly in regard to a wound in the right lateral heel location. This fortunately does not appear to be too bad but nonetheless is definitely a spot that we need to work on try to get healed for him. Has been here for quite some time. Fortunately there does not appear to be any evidence of active infection at this time which is great news. With that being said I do believe that he has some evidence here of peripheral vascular disease this is minimal his ABI 0.87. Other than that he does have a history of BPH, chronic kidney disease stage III, and hypertension. Currently he has been using different medication such as Silvadene or antibiotic ointment to try to  help treat things. This just is not helping out as efficiently as it should be. Of note patient's history includes that of a triple bypass in 2010, a kyphoplasty at L1 which was done 8 months ago, unfortunately he had a fracture 2 months ago which she sustained a T7/T8 fracture to his spine which ended up with him being in the hospital. It is in that time since then that he developed this wound roughly 2 to 3 months ago is not exactly sure when. He does typically wear compression socks in the 20 to 30 mmHg range. 09/28/2020 upon evaluation today patient presents for follow-up and overall seems to be doing more poorly based on what I am seeing currently. His ABI last time was measured at 0.87 but I am beginning to think this may be falsely elevated due to the fact that there is more eschar and subsequently a concern here of vascular compromise that may be part of her issue. I think that we need to avoid any additional sharp debridement and make a referral to vascular surgery as soon as possible here. Subsequently also went ahead and did discuss the possibility of starting the patient on Santyl instead of the collagen in order to help clean up the surface of the wound this will take the place of sharp debridement currently. 10/19/2020 upon evaluation today patient's wound actually appears to be doing okay although he has an area on the medial aspect of his heel that is only been showing up for the past couple of days this is more of a blister that has ruptured. The good news is there is no deep wound here which is good. Fortunately there is no signs of active infection at this time also good news. With that being  said however the blood flow was not good with an ABI of 0.67 on the right it was around 1 on the left nonetheless I think he does need to see vascular we put that referral in last week on the 12th Dr. Dellia Nims reviewed that for me and made that recommendations. Nonetheless the patient has not heard  anything from vascular yet as far as getting this scheduled. I think this needs to be done sooner rather than later as I am concerned about the possibility of a limb threatening situation if we do not get this under control.. 11/16/2020 upon evaluation today patient presents for follow-up regarding issues that he has been having with his bilateral lower extremities in particular. He in fact still has an issue with his right heel. This appears to be a stage III pressure ulcer at this point there is some slough noted. Subsequently he does have revascularization that is taking place on the right leg as a follow-up later this month in that regard. No repeat ABIs. Subsequently the patient also has venous stasis bilaterally which is also problematic for him at this time. He has some ulcers on the left ankle and left leg that are getting need to be addressed as well. 12/21/2020 upon evaluation today patient appears to be doing poorly in regard to his bilateral lower extremity ulcers. He still has areas on both lower extremities currently ineffective even some areas that were not there previous. Fortunately there is no signs of active infection at this time. No fevers, chills, nausea, vomiting, or diarrhea. 01/04/21 upon evaluation today patient actually appears to be doing quite well in regard to his wounds. Fortunately there does not appear to be any signs of active infection which is great. There is some necrotic tissue that I think would be helpful to debride away pretty much on all wounds other than the heel which appears to be doing quite well. 01/25/2021 upon evaluation today patient appears to be doing okay in regard to his wounds. There are some measurements that are definitely smaller which is good news. Fortunately I do not see any evidence of active infection locally nor systemically at this point which is also great news. He does have a deep tissue injury which seems to be getting a bit worse on the  lateral portion of his right heel. He tells me is not been using the bunny boots and he has not been using a pillow under his calves either. Nonetheless I think this is absolutely a pressure injury and I am very concerned about this. Also I do not want this to get a lot worse. He also tells me his hemoglobin is around 7 he is going to be seeing hematology for a transfusion either today or tomorrow most likely this will definitely help with his healing. 02/01/2021 upon evaluation today patient actually appears to be making excellent progress in regard to his wound currently. Fortunately I feel like that all the wounds are showing signs of improvement the Iodoflex seems to be doing an awesome job. I am very pleased with where we stand today. There does not appear to be any signs of active infection. 02/22/2021 upon evaluation today patient appears to be doing well with regard to his wound. He has been tolerating the dressing changes without complication. Fortunately there does not appear to be any evidence of active infection locally or systemically at this point which is good news. Overall I think that we are headed in the right direction and the  Iodoflex is doing a good job. 03/08/2021 upon evaluation today patient appears to be doing well all things considered with regard to his wounds. He has been tolerating the dressing changes without complication. Fortunately there does not appear to be any evidence of active infection locally nor systemically which is great news overall I think the Iodosorb is doing an awesome job here. Electronic Signature(s) Signed: 03/08/2021 3:51:46 PM By: [name] PA-C Entered By: [name] on 03/08/2021 15:51:46 -------------------------------------------------------------------------------- Physical Exam Details Patient Name: Date of Service: [name], [name] 03/08/2021 2:15 PM Medical Record Number: [id number] Patient Account Number: [id number] Date of  Birth/Sex: Treating RN: [birth year] (88 y.o. [name] Primary Care Provider: [name] Other Clinician: Referring Provider: Treating Provider/Extender: [name] in Treatment: 63 Constitutional Well-nourished and well-hydrated in no acute distress. Respiratory normal breathing without difficulty. Psychiatric this patient is able to make decisions and demonstrates good insight into disease process. Alert and Oriented x 3. pleasant and cooperative. Notes Upon inspection patient's wound bed showed evidence of good granulation and epithelization at this point. Fortunately I do not see any evidence of active infection locally nor systemically which is great news and overall I think we are headed in the right direction. Electronic Signature(s) Signed: 03/08/2021 3:52:11 PM By: [name] PA-C Entered By: [name] on 03/08/2021 15:52:11 -------------------------------------------------------------------------------- Physician Orders Details Patient Name: Date of Service: [name], [name] 03/08/2021 2:15 PM Medical Record Number: [id number] Patient Account Number: [id number] Date of Birth/Sex: Treating RN: 05-31-1926 (88 y.o. [name], [name] Primary Care Provider: [name] Other Clinician: Referring Provider: Treating Provider/Extender: Eli Hose in Treatment: 24 Verbal / Phone Orders: No Diagnosis Coding ICD-10 Coding Code Description L89.613 Pressure ulcer of right heel, stage 3 I73.89 Other specified peripheral vascular diseases I87.2 Venous insufficiency (chronic) (peripheral) L97.822 Non-pressure chronic ulcer of other part of left lower leg with fat layer exposed L97.312 Non-pressure chronic ulcer of right ankle with fat layer exposed N40.1 Benign prostatic hyperplasia with lower urinary tract symptoms N18.30 Chronic kidney disease, stage 3 unspecified I10 Essential (primary) hypertension Follow-up  Appointments ppointment in 2 weeks. Margarita Grizzle Wednesday and [name] Room # 8 ***Extra time 60 minutes*** Return A Other: - [employer]- supplies. Bathing/ Shower/ Hygiene May shower and wash wound with soap and water. - with dressing changes. Edema Control - Lymphedema / SCD / Other Elevate legs to the level of the heart or above for 30 minutes daily and/or when sitting, a frequency of: - throughout the day. Avoid standing for long periods of time. Moisturize legs daily. - both legs and feet with dressing changes. Off-Loading Other: - Heel cup to left heel for protection. bunny boots to both feet while resting in bed or chair. If unable to use bunny boots use pillow to elevate both legs to offload pressure to heels. Ensure no pressure to wounds to aid in healing. Additional Orders / Instructions Other: - May continue to walk and work with physical therapy. Wound Treatment Wound #1 - Calcaneus Wound Laterality: Right, Lateral Cleanser: Soap and Water Every Other Day/30 Days Discharge Instructions: May shower and wash wound with dial antibacterial soap and water prior to dressing change. Prim Dressing: Iodosorb Gel 10 (gm) Tube (DME) (Generic) Every Other Day/30 Days ary Discharge Instructions: Apply to wound bed as instructed Secondary Dressing: Woven Gauze Sponge, Non-Sterile 4x4 in (Generic) Every Other Day/30  Days Discharge Instructions: Apply over primary dressing as directed. Secondary Dressing: Zetuvit Plus Silicone Border Dressing 4x4 (in/in) (Generic) Every Other Day/30 Days Discharge Instructions: Apply silicone border over primary dressing as directed. Secured With: [employer], 4.5x3.1 (in/yd) (Generic) Every Other Day/30 Days Discharge Instructions: Secure with Kerlix as directed. Secured With: 41M Medipore H Soft Cloth Surgical T ape, 4 x 10 (in/yd) (Generic) Every Other Day/30 Days Discharge Instructions: Secure with tape as directed. Wound #2 - Calcaneus Wound  Laterality: Right, Medial Cleanser: Soap and Water Every Other Day/30 Days Discharge Instructions: May shower and wash wound with dial antibacterial soap and water prior to dressing change. Prim Dressing: Iodosorb Gel 10 (gm) Tube (DME) (Generic) Every Other Day/30 Days ary Discharge Instructions: Apply to wound bed as instructed Secondary Dressing: Woven Gauze Sponge, Non-Sterile 4x4 in (Generic) Every Other Day/30 Days Discharge Instructions: Apply over primary dressing as directed. Secondary Dressing: Zetuvit Plus Silicone Border Dressing 4x4 (in/in) (Generic) Every Other Day/30 Days Discharge Instructions: Apply silicone border over primary dressing as directed. Secured With: [employer], 4.5x3.1 (in/yd) (Generic) Every Other Day/30 Days Discharge Instructions: Secure with Kerlix as directed. Secured With: 41M Medipore H Soft Cloth Surgical T ape, 4 x 10 (in/yd) (Generic) Every Other Day/30 Days Discharge Instructions: Secure with tape as directed. Wound #3 - Ankle Wound Laterality: Left, Lateral Cleanser: Soap and Water Every Other Day/30 Days Discharge Instructions: May shower and wash wound with dial antibacterial soap and water prior to dressing change. Prim Dressing: Iodosorb Gel 10 (gm) Tube (DME) (Generic) Every Other Day/30 Days ary Discharge Instructions: Apply to wound bed as instructed Secondary Dressing: Woven Gauze Sponge, Non-Sterile 4x4 in (Generic) Every Other Day/30 Days Discharge Instructions: Apply over primary dressing as directed. Secondary Dressing: Zetuvit Plus Silicone Border Dressing 4x4 (in/in) (Generic) Every Other Day/30 Days Discharge Instructions: Apply silicone border over primary dressing as directed. Secured With: [employer], 4.5x3.1 (in/yd) (Generic) Every Other Day/30 Days Discharge Instructions: Secure with Kerlix as directed. Secured With: 41M Medipore H Soft Cloth Surgical T ape, 4 x 10 (in/yd) (Generic) Every Other Day/30  Days Discharge Instructions: Secure with tape as directed. Wound #4 - Lower Leg Wound Laterality: Left, Medial Cleanser: Soap and Water Every Other Day/30 Days Discharge Instructions: May shower and wash wound with dial antibacterial soap and water prior to dressing change. Prim Dressing: Iodosorb Gel 10 (gm) Tube (DME) (Generic) Every Other Day/30 Days ary Discharge Instructions: Apply to wound bed as instructed Secondary Dressing: Woven Gauze Sponge, Non-Sterile 4x4 in (Generic) Every Other Day/30 Days Discharge Instructions: Apply over primary dressing as directed. Secondary Dressing: Zetuvit Plus Silicone Border Dressing 4x4 (in/in) (Generic) Every Other Day/30 Days Discharge Instructions: Apply silicone border over primary dressing as directed. Secured With: [employer], 4.5x3.1 (in/yd) (Generic) Every Other Day/30 Days Discharge Instructions: Secure with Kerlix as directed. Secured With: 41M Medipore H Soft Cloth Surgical T ape, 4 x 10 (in/yd) (Generic) Every Other Day/30 Days Discharge Instructions: Secure with tape as directed. Wound #6 - Ankle Wound Laterality: Right, Lateral Cleanser: Soap and Water Every Other Day/30 Days Discharge Instructions: May shower and wash wound with dial antibacterial soap and water prior to dressing change. Prim Dressing: Iodosorb Gel 10 (gm) Tube (DME) (Generic) Every Other Day/30 Days ary Discharge Instructions: Apply to wound bed as instructed Secondary Dressing: Woven Gauze Sponge, Non-Sterile 4x4 in (Generic) Every Other Day/30 Days Discharge Instructions: Apply over primary dressing as directed. Secondary Dressing: Zetuvit Plus Silicone Border Dressing 4x4 (  in/in) (Generic) Every Other Day/30 Days Discharge Instructions: Apply silicone border over primary dressing as directed. Secured With: [employer], 4.5x3.1 (in/yd) (Generic) Every Other Day/30 Days Discharge Instructions: Secure with Kerlix as directed. Secured With: 53M  Medipore H Soft Cloth Surgical T ape, 4 x 10 (in/yd) (Generic) Every Other Day/30 Days Discharge Instructions: Secure with tape as directed. Electronic Signature(s) Signed: 03/08/2021 3:53:22 PM By: [name] PA-C Signed: 03/08/2021 4:44:26 PM By: Deon Pilling RN, BSN Entered By: Deon Pilling on 03/08/2021 15:26:49 -------------------------------------------------------------------------------- Problem List Details Patient Name: Date of Service: [name], [name] 03/08/2021 2:15 PM Medical Record Number: [id number] Patient Account Number: [id number] Date of Birth/Sex: Treating RN: July 30, 1926 (88 y.o. [name] Primary Care Provider: [name] Other Clinician: Referring Provider: Treating Provider/Extender: Eli Hose in Treatment: 24 Active Problems ICD-10 Encounter Code Description Active Date MDM Diagnosis L89.613 Pressure ulcer of right heel, stage 3 09/21/2020 No Yes I73.89 Other specified peripheral vascular diseases 09/21/2020 No Yes I87.2 Venous insufficiency (chronic) (peripheral) 11/16/2020 No Yes L97.822 Non-pressure chronic ulcer of other part of left lower leg with fat layer 11/16/2020 No Yes exposed L97.312 Non-pressure chronic ulcer of right ankle with fat layer exposed 11/16/2020 No Yes N40.1 Benign prostatic hyperplasia with lower urinary tract symptoms 09/21/2020 No Yes N18.30 Chronic kidney disease, stage 3 unspecified 09/21/2020 No Yes I10 Essential (primary) hypertension 09/21/2020 No Yes Inactive Problems Resolved Problems Electronic Signature(s) Signed: 03/08/2021 2:42:29 PM By: [name] PA-C Entered By: [name] on 03/08/2021 14:42:29 -------------------------------------------------------------------------------- Progress Note Details Patient Name: Date of Service: [name], [name] 03/08/2021 2:15 PM Medical Record Number: [id number] Patient Account Number: [id number] Date of Birth/Sex: Treating  RN: [birth year] (88 y.o. [name] Primary Care Provider: [name] Other Clinician: Referring Provider: Treating Provider/Extender: Eli Hose in Treatment: 24 Subjective Chief Complaint Information obtained from Patient Pressure ulcer right heel and left LE and ankle ulcers History of Present Illness (HPI) 09/21/2020 upon evaluation today patient appears to be doing somewhat poorly in regard to a wound in the right lateral heel location. This fortunately does not appear to be too bad but nonetheless is definitely a spot that we need to work on try to get healed for him. Has been here for quite some time. Fortunately there does not appear to be any evidence of active infection at this time which is great news. With that being said I do believe that he has some evidence here of peripheral vascular disease this is minimal his ABI 0.87. Other than that he does have a history of BPH, chronic kidney disease stage III, and hypertension. Currently he has been using different medication such as Silvadene or antibiotic ointment to try to help treat things. This just is not helping out as efficiently as it should be. Of note patient's history includes that of a triple bypass in 2010, a kyphoplasty at L1 which was done 8 months ago, unfortunately he had a fracture 2 months ago which she sustained a T7/T8 fracture to his spine which ended up with him being in the hospital. It is in that time since then that he developed this wound roughly 2 to 3 months ago is not exactly sure when. He does typically wear compression socks in the 20 to 30 mmHg range. 09/28/2020 upon evaluation today patient presents for follow-up and overall seems to be doing more poorly based on  what I am seeing currently. His ABI last time was measured at 0.87 but I am beginning to think this may be falsely elevated due to the fact that there is more eschar and subsequently a concern here of vascular  compromise that may be part of her issue. I think that we need to avoid any additional sharp debridement and make a referral to vascular surgery as soon as possible here. Subsequently also went ahead and did discuss the possibility of starting the patient on Santyl instead of the collagen in order to help clean up the surface of the wound this will take the place of sharp debridement currently. 10/19/2020 upon evaluation today patient's wound actually appears to be doing okay although he has an area on the medial aspect of his heel that is only been showing up for the past couple of days this is more of a blister that has ruptured. The good news is there is no deep wound here which is good. Fortunately there is no signs of active infection at this time also good news. With that being said however the blood flow was not good with an ABI of 0.67 on the right it was around 1 on the left nonetheless I think he does need to see vascular we put that referral in last week on the 12th Dr. Dellia Nims reviewed that for me and made that recommendations. Nonetheless the patient has not heard anything from vascular yet as far as getting this scheduled. I think this needs to be done sooner rather than later as I am concerned about the possibility of a limb threatening situation if we do not get this under control.. 11/16/2020 upon evaluation today patient presents for follow-up regarding issues that he has been having with his bilateral lower extremities in particular. He in fact still has an issue with his right heel. This appears to be a stage III pressure ulcer at this point there is some slough noted. Subsequently he does have revascularization that is taking place on the right leg as a follow-up later this month in that regard. No repeat ABIs. Subsequently the patient also has venous stasis bilaterally which is also problematic for him at this time. He has some ulcers on the left ankle and left leg that are getting  need to be addressed as well. 12/21/2020 upon evaluation today patient appears to be doing poorly in regard to his bilateral lower extremity ulcers. He still has areas on both lower extremities currently ineffective even some areas that were not there previous. Fortunately there is no signs of active infection at this time. No fevers, chills, nausea, vomiting, or diarrhea. 01/04/21 upon evaluation today patient actually appears to be doing quite well in regard to his wounds. Fortunately there does not appear to be any signs of active infection which is great. There is some necrotic tissue that I think would be helpful to debride away pretty much on all wounds other than the heel which appears to be doing quite well. 01/25/2021 upon evaluation today patient appears to be doing okay in regard to his wounds. There are some measurements that are definitely smaller which is good news. Fortunately I do not see any evidence of active infection locally nor systemically at this point which is also great news. He does have a deep tissue injury which seems to be getting a bit worse on the lateral portion of his right heel. He tells me is not been using the bunny boots and he has not been  using a pillow under his calves either. Nonetheless I think this is absolutely a pressure injury and I am very concerned about this. Also I do not want this to get a lot worse. He also tells me his hemoglobin is around 7 he is going to be seeing hematology for a transfusion either today or tomorrow most likely this will definitely help with his healing. 02/01/2021 upon evaluation today patient actually appears to be making excellent progress in regard to his wound currently. Fortunately I feel like that all the wounds are showing signs of improvement the Iodoflex seems to be doing an awesome job. I am very pleased with where we stand today. There does not appear to be any signs of active infection. 02/22/2021 upon evaluation today  patient appears to be doing well with regard to his wound. He has been tolerating the dressing changes without complication. Fortunately there does not appear to be any evidence of active infection locally or systemically at this point which is good news. Overall I think that we are headed in the right direction and the Iodoflex is doing a good job. 03/08/2021 upon evaluation today patient appears to be doing well all things considered with regard to his wounds. He has been tolerating the dressing changes without complication. Fortunately there does not appear to be any evidence of active infection locally nor systemically which is great news overall I think the Iodosorb is doing an awesome job here. Objective Constitutional Well-nourished and well-hydrated in no acute distress. Vitals Time Taken: 2:35 PM, Height: 72 in, Weight: 223 lbs, BMI: 30.2, Temperature: 97.5 F, Pulse: 61 bpm, Respiratory Rate: 20 breaths/min, Blood Pressure: 160/74 mmHg. Respiratory normal breathing without difficulty. Psychiatric this patient is able to make decisions and demonstrates good insight into disease process. Alert and Oriented x 3. pleasant and cooperative. General Notes: Upon inspection patient's wound bed showed evidence of good granulation and epithelization at this point. Fortunately I do not see any evidence of active infection locally nor systemically which is great news and overall I think we are headed in the right direction. Integumentary (Hair, Skin) Wound #1 status is Open. Original cause of wound was Pressure Injury. The date acquired was: 06/08/2020. The wound has been in treatment 24 weeks. The wound is located on the Right,Lateral Calcaneus. The wound measures 0.8cm length x 1.4cm width x 0.2cm depth; 0.88cm^2 area and 0.176cm^3 volume. There is Fat Layer (Subcutaneous Tissue) exposed. There is no tunneling or undermining noted. There is a medium amount of serosanguineous drainage noted. The  wound margin is distinct with the outline attached to the wound base. There is small (1-33%) red granulation within the wound bed. There is a large (67-100%) amount of necrotic tissue within the wound bed including Adherent Slough. Wound #2 status is Open. Original cause of wound was Pressure Injury. The date acquired was: 10/17/2020. The wound has been in treatment 20 weeks. The wound is located on the Right,Medial Calcaneus. The wound measures 0.6cm length x 2.2cm width x 0.1cm depth; 1.037cm^2 area and 0.104cm^3 volume. There is Fat Layer (Subcutaneous Tissue) exposed. There is no tunneling or undermining noted. There is a medium amount of serosanguineous drainage noted. The wound margin is distinct with the outline attached to the wound base. There is large (67-100%) red, pink, friable granulation within the wound bed. There is a small (1-33%) amount of necrotic tissue within the wound bed including Adherent Slough. Wound #3 status is Open. Original cause of wound was Gradually Appeared. The date  acquired was: 10/12/2020. The wound has been in treatment 16 weeks. The wound is located on the Left,Lateral Ankle. The wound measures 2.2cm length x 1.5cm width x 0.5cm depth; 2.592cm^2 area and 1.296cm^3 volume. There is Fat Layer (Subcutaneous Tissue) exposed. There is no tunneling or undermining noted. There is a medium amount of serosanguineous drainage noted. The wound margin is distinct with the outline attached to the wound base. There is small (1-33%) red, friable granulation within the wound bed. There is a large (67- 100%) amount of necrotic tissue within the wound bed including Eschar and Adherent Slough. Wound #4 status is Open. Original cause of wound was Other Lesion. The date acquired was: 11/16/2020. The wound has been in treatment 16 weeks. The wound is located on the Left,Medial Lower Leg. The wound measures 0.6cm length x 0.5cm width x 0.4cm depth; 0.236cm^2 area and 0.094cm^3 volume.  There is Fat Layer (Subcutaneous Tissue) exposed. There is no tunneling or undermining noted. There is a medium amount of serosanguineous drainage noted. The wound margin is well defined and not attached to the wound base. There is large (67-100%) red, pink granulation within the wound bed. There is a small (1-33%) amount of necrotic tissue within the wound bed including Adherent Slough. Wound #6 status is Open. Original cause of wound was Gradually Appeared. The date acquired was: 11/22/2020. The wound has been in treatment 11 weeks. The wound is located on the Right,Lateral Ankle. The wound measures 1.5cm length x 0.9cm width x 0.6cm depth; 1.06cm^2 area and 0.636cm^3 volume. There is Fat Layer (Subcutaneous Tissue) exposed. There is no tunneling or undermining noted. There is a medium amount of serosanguineous drainage noted. The wound margin is distinct with the outline attached to the wound base. There is no granulation within the wound bed. There is a large (67-100%) amount of necrotic tissue within the wound bed including Adherent Slough. 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 Margarita Grizzle Wednesday and [name] Room # 8 ***Extra time 60 minutes*** Other: - [employer]- supplies. Bathing/ Shower/ Hygiene: May shower and wash wound with soap and water. - with dressing changes. Edema Control - Lymphedema / SCD / Other: Elevate legs to the level of the heart or above for 30 minutes daily and/or when sitting, a frequency of: -  throughout the day. Avoid standing for long periods of time. Moisturize legs daily. - both legs and feet with dressing changes. Off-Loading: Other: - Heel cup to left heel for protection. bunny boots to both feet while resting in bed or chair. If unable to use bunny boots use pillow to elevate both legs to offload pressure to heels. Ensure no pressure to wounds to aid in healing. Additional Orders / Instructions: Other: - May continue to walk and work with physical therapy. WOUND #1: - Calcaneus Wound Laterality: Right, Lateral Cleanser: Soap and Water Every Other Day/30 Days Discharge Instructions: May shower and wash wound with dial antibacterial soap and water prior to dressing change. Prim Dressing: Iodosorb Gel 10 (gm) Tube (DME) (Generic) Every Other Day/30 Days ary Discharge Instructions: Apply to wound bed as instructed Secondary Dressing: Woven Gauze Sponge,  Non-Sterile 4x4 in (Generic) Every Other Day/30 Days Discharge Instructions: Apply over primary dressing as directed. Secondary Dressing: Zetuvit Plus Silicone Border Dressing 4x4 (in/in) (Generic) Every Other Day/30 Days Discharge Instructions: Apply silicone border over primary dressing as directed. Secured With: [employer], 4.5x3.1 (in/yd) (Generic) Every Other Day/30 Days Discharge Instructions: Secure with Kerlix as directed. Secured With: 51M Medipore H Soft Cloth Surgical T ape, 4 x 10 (in/yd) (Generic) Every Other Day/30 Days Discharge Instructions: Secure with tape as directed. WOUND #2: - Calcaneus Wound Laterality: Right, Medial Cleanser: Soap and Water Every Other Day/30 Days Discharge Instructions: May shower and wash wound with dial antibacterial soap and water prior to dressing change. Prim Dressing: Iodosorb Gel 10 (gm) Tube (DME) (Generic) Every Other Day/30 Days ary Discharge Instructions: Apply to wound bed as instructed Secondary Dressing: Woven Gauze Sponge, Non-Sterile 4x4 in (Generic) Every  Other Day/30 Days Discharge Instructions: Apply over primary dressing as directed. Secondary Dressing: Zetuvit Plus Silicone Border Dressing 4x4 (in/in) (Generic) Every Other Day/30 Days Discharge Instructions: Apply silicone border over primary dressing as directed. Secured With: [employer], 4.5x3.1 (in/yd) (Generic) Every Other Day/30 Days Discharge Instructions: Secure with Kerlix as directed. Secured With: 51M Medipore H Soft Cloth Surgical T ape, 4 x 10 (in/yd) (Generic) Every Other Day/30 Days Discharge Instructions: Secure with tape as directed. WOUND #3: - Ankle Wound Laterality: Left, Lateral Cleanser: Soap and Water Every Other Day/30 Days Discharge Instructions: May shower and wash wound with dial antibacterial soap and water prior to dressing change. Prim Dressing: Iodosorb Gel 10 (gm) Tube (DME) (Generic) Every Other Day/30 Days ary Discharge Instructions: Apply to wound bed as instructed Secondary Dressing: Woven Gauze Sponge, Non-Sterile 4x4 in (Generic) Every Other Day/30 Days Discharge Instructions: Apply over primary dressing as directed. Secondary Dressing: Zetuvit Plus Silicone Border Dressing 4x4 (in/in) (Generic) Every Other Day/30 Days Discharge Instructions: Apply silicone border over primary dressing as directed. Secured With: [employer], 4.5x3.1 (in/yd) (Generic) Every Other Day/30 Days Discharge Instructions: Secure with Kerlix as directed. Secured With: 51M Medipore H Soft Cloth Surgical T ape, 4 x 10 (in/yd) (Generic) Every Other Day/30 Days Discharge Instructions: Secure with tape as directed. WOUND #4: - Lower Leg Wound Laterality: Left, Medial Cleanser: Soap and Water Every Other Day/30 Days Discharge Instructions: May shower and wash wound with dial antibacterial soap and water prior to dressing change. Prim Dressing: Iodosorb Gel 10 (gm) Tube (DME) (Generic) Every Other Day/30 Days ary Discharge Instructions: Apply to wound bed as  instructed Secondary Dressing: Woven Gauze Sponge, Non-Sterile 4x4 in (Generic) Every Other Day/30 Days Discharge Instructions: Apply over primary dressing as directed. Secondary Dressing: Zetuvit Plus Silicone Border Dressing 4x4 (in/in) (Generic) Every Other Day/30 Days Discharge Instructions: Apply silicone border over primary dressing as directed. Secured With: [employer], 4.5x3.1 (in/yd) (Generic) Every Other Day/30 Days Discharge Instructions: Secure with Kerlix as directed. Secured With: 51M Medipore H Soft Cloth Surgical T ape, 4 x 10 (in/yd) (Generic) Every Other Day/30 Days Discharge Instructions: Secure with tape as directed. WOUND #6: - Ankle Wound Laterality: Right, Lateral Cleanser: Soap and Water Every Other Day/30 Days Discharge Instructions: May shower and wash wound with dial antibacterial soap and water prior to dressing change. Prim Dressing: Iodosorb Gel 10 (gm) Tube (DME) (Generic) Every Other Day/30 Days ary Discharge Instructions: Apply to wound bed as instructed Secondary Dressing: Woven Gauze Sponge, Non-Sterile 4x4 in (Generic) Every Other Day/30 Days Discharge Instructions: Apply over primary dressing as directed. Secondary  Dressing: Zetuvit Plus Silicone Border Dressing 4x4 (in/in) (Generic) Every Other Day/30 Days Discharge Instructions: Apply silicone border over primary dressing as directed. Secured With: [employer], 4.5x3.1 (in/yd) (Generic) Every Other Day/30 Days Discharge Instructions: Secure with Kerlix as directed. Secured With: 75M Medipore H Soft Cloth Surgical T ape, 4 x 10 (in/yd) (Generic) Every Other Day/30 Days Discharge Instructions: Secure with tape as directed. 1. I would recommend that we going continue with the Iodosorb which I think is doing a great job. The patient's wounds are cleaning up quite nicely. 2. Also can recommend that we have the patient continue to monitor for any signs of worsening or infection. Obviously if  anything changes he should let me know but at this point based on what I am seeing I really think that if she is can be a matter of time to get these areas to heal already were seeing good improvements. We will see patient back for reevaluation in 2 weeks here in the clinic. If anything worsens or changes patient will contact our office for additional recommendations. Electronic Signature(s) Signed: 03/08/2021 3:52:43 PM By: [name] PA-C Entered By: [name] on 03/08/2021 15:52:43 -------------------------------------------------------------------------------- SuperBill Details Patient Name: Date of Service: [name], [name] 03/08/2021 Medical Record Number: [id number] Patient Account Number: [id number] Date of Birth/Sex: Treating RN: Nov 17, 1926 (88 y.o. [name], [name] Primary Care Provider: [name] Other Clinician: Referring Provider: Treating Provider/Extender: Eli Hose in Treatment: 24 Diagnosis Coding ICD-10 Codes Code Description [phone] Pressure ulcer of right heel, stage 3 I73.89 Other specified peripheral vascular diseases I87.2 Venous insufficiency (chronic) (peripheral) L97.822 Non-pressure chronic ulcer of other part of left lower leg with fat layer exposed L97.312 Non-pressure chronic ulcer of right ankle with fat layer exposed N40.1 Benign prostatic hyperplasia with lower urinary tract symptoms N18.30 Chronic kidney disease, stage 3 unspecified I10 Essential (primary) hypertension Facility Procedures CPT4 Code: [id number] Description: [phone] - WOUND CARE VISIT-LEV 5 EST PT Modifier: Quantity: 1 Physician Procedures : CPT4 Code Description Modifier [id number] - WC PHYS LEVEL 4 - EST PT ICD-10 Diagnosis Description L89.613 Pressure ulcer of right heel, stage 3 I73.89 Other specified peripheral vascular diseases I87.2 Venous insufficiency (chronic) (peripheral)  L97.822 Non-pressure chronic ulcer of other part of left  lower leg with fat layer exposed Quantity: 1 Electronic Signature(s) Signed: 03/08/2021 3:52:58 PM By: [name] PA-C Entered By: [name] on 03/08/2021 15:52:57

## 2021-03-08 NOTE — Progress Notes (Signed)
GREYSYN, VANDERBERG I. ([id number]) Visit Report for 03/08/2021 Arrival Information Details Patient Name: Date of Service: [name] [name] ID I. 03/08/2021 2:15 PM Medical Record Number: [id number] Patient Account Number: [id number] Date of Birth/Sex: Treating RN: September 17, 1926 (88 y.o. [name] [name] Primary Care [name] [name]: [name] Other Clinician: Referring [name] [name]: Treating [name] [name]/Extender: [name] [name] in Treatment: 24 Visit Information History Since Last Visit Added or deleted any medications: No Patient Arrived: Wheel Chair Any new allergies or adverse reactions: No Arrival Time: 14:35 Had a fall or experienced change in No Accompanied By: wife activities of daily living that may affect Transfer Assistance: Manual risk of falls: Patient Identification Verified: Yes Signs or symptoms of abuse/neglect since last visito No Secondary Verification Process Completed: Yes Hospitalized since last visit: No Patient Requires Transmission-Based Precautions: No Implantable device outside of the clinic excluding No Patient Has Alerts: Yes cellular tissue based products placed in the center Patient Alerts: Patient on Blood Thinner since last visit: Has Dressing in Place as Prescribed: Yes Has Compression in Place as Prescribed: Yes Pain Present Now: Yes Notes per pain at night. Electronic Signature(s) Signed: 03/08/2021 4:44:26 PM By: Deon Pilling RN, BSN Entered By: Deon Pilling on 03/08/2021 14:39:18 -------------------------------------------------------------------------------- Clinic Level of Care Assessment Details Patient Name: Date of Service: [name], [name] 03/08/2021 2:15 PM Medical Record Number: [id number] Patient Account Number: [id number] Date of Birth/Sex: Treating RN: 1926-02-25 (88 y.o. [name] [name] Primary Care Birgit Nowling: [name] Other Clinician: Referring Rayson Rando: Treating Ovie Eastep/Extender: [name] [name] in  Treatment: 24 Clinic Level of Care Assessment Items TOOL 4 Quantity Score X- 1 0 Use when only an EandM is performed on FOLLOW-UP visit ASSESSMENTS - Nursing Assessment / Reassessment X- 1 10 Reassessment of Co-morbidities (includes updates in patient status) X- 1 5 Reassessment of Adherence to Treatment Plan ASSESSMENTS - Wound and Skin A ssessment / Reassessment _0  - 0 Simple Wound Assessment / Reassessment - one wound X- 5 5 Complex Wound Assessment / Reassessment - multiple wounds X- 1 10 Dermatologic / Skin Assessment (not related to wound area) ASSESSMENTS - Focused Assessment _1  - 0 Circumferential Edema Measurements - multi extremities _2  - 0 Nutritional Assessment / Counseling / Intervention _3  - 0 Lower Extremity Assessment (monofilament, tuning fork, pulses) _4  - 0 Peripheral Arterial Disease Assessment (using hand held doppler) ASSESSMENTS - Ostomy and/or Continence Assessment and Care _5  - 0 Incontinence Assessment and Management _6  - 0 Ostomy Care Assessment and Management (repouching, etc.) PROCESS - Coordination of Care _7  - 0 Simple Patient / Family Education for ongoing care X- 1 20 Complex (extensive) Patient / Family Education for ongoing care X- 1 10 Staff obtains Programmer, systems, Records, T Results / Process Orders est _8  - 0 Staff telephones HHA, Nursing Homes / Clarify orders / etc _9  - 0 Routine Transfer to another Facility (non-emergent condition) _10  - 0 Routine Hospital Admission (non-emergent condition) _11  - 0 New Admissions / Biomedical engineer / Ordering NPWT Apligraf, etc. , _12  - 0 Emergency Hospital Admission (emergent condition) _13  - 0 Simple Discharge Coordination X- 1 15 Complex (extensive) Discharge Coordination PROCESS - Special Needs _14  - 0 Pediatric / Minor Patient Management _15  - 0 Isolation Patient Management _16  - 0 Hearing / Language / Visual special needs _17  - 0 Assessment of Community assistance (transportation,  D/C planning, etc.) _18  - 0 Additional assistance / Altered mentation _19  - 0 Support Surface(s) Assessment (bed, cushion,  seat, etc.) INTERVENTIONS - Wound Cleansing / Measurement _0  - 0 Simple Wound Cleansing - one wound X- 5 5 Complex Wound Cleansing - multiple wounds X- 1 5 Wound Imaging (photographs - any number of wounds) _1  - 0 Wound Tracing (instead of photographs) _2  - 0 Simple Wound Measurement - one wound X- 5 5 Complex Wound Measurement - multiple wounds INTERVENTIONS - Wound Dressings X - Small Wound Dressing one or multiple wounds 1 10 X- 2 15 Medium Wound Dressing one or multiple wounds _3  - 0 Large Wound Dressing one or multiple wounds X- 1 5 Application of Medications - topical <[id number]>_4<\/[id number]>_4  - 0 Application of Medications - injection INTERVENTIONS - Miscellaneous _5  - 0 External ear exam _6  - 0 Specimen Collection (cultures, biopsies, blood, body fluids, etc.) _7  - 0 Specimen(s) / Culture(s) sent or taken to Lab for analysis _8  - 0 Patient Transfer (multiple staff / Civil Service fast streamer / Similar devices) _9  - 0 Simple Staple / Suture removal (25 or less) _10  - 0 Complex Staple / Suture removal (26 or more) _11  - 0 Hypo / Hyperglycemic Management (close monitor of Blood Glucose) _12  - 0 Ankle / Brachial Index (ABI) - do not check if billed separately X- 1 5 Vital Signs Has the patient been seen at the hospital within the last three years: Yes Total Score: 200 Level Of Care: New/Established - Level 5 Electronic Signature(s) Signed: 03/08/2021 4:44:26 PM By: Deon Pilling RN, BSN Entered By: Deon Pilling on 03/08/2021 15:27:52 -------------------------------------------------------------------------------- Encounter Discharge Information Details Patient Name: Date of Service: [name], [name] 03/08/2021 2:15 PM Medical Record Number: [id number] Patient Account Number: [id number] Date of Birth/Sex: Treating RN: 1926/08/15 (88 y.o. [name] [name] Primary Care Amenda Duclos:  [name] Other Clinician: Referring Jnya Brossard: Treating Zyheir Daft/Extender: [name] [name] in Treatment: 24 Encounter Discharge Information Items Discharge Condition: Stable Ambulatory Status: Wheelchair Discharge Destination: Home Transportation: Private Auto Accompanied By: wife Schedule Follow-up Appointment: Yes Clinical Summary of Care: Electronic Signature(s) Signed: 03/08/2021 4:44:26 PM By: Deon Pilling RN, BSN Entered By: Deon Pilling on 03/08/2021 15:52:50 -------------------------------------------------------------------------------- Lower Extremity Assessment Details Patient Name: Date of Service: [name], [name] 03/08/2021 2:15 PM Medical Record Number: [id number] Patient Account Number: [id number] Date of Birth/Sex: Treating RN: July 09, 1926 (88 y.o. [name] [name] Primary Care Esau Fridman: [name] Other Clinician: Referring Gwenith Tschida: Treating Merik Mignano/Extender: [name] in Treatment: 24 Edema Assessment Assessed: [Left: Yes] [Right: No] Edema: [Left: Yes] [Right: Yes] Calf Left: Right: Point of Measurement: 34 cm From Medial Instep 42 cm 44 cm Ankle Left: Right: Point of Measurement: 11 cm From Medial Instep 22 cm 30 cm Vascular Assessment Pulses: Dorsalis Pedis Palpable: [Left:Yes] [Right:Yes] Electronic Signature(s) Signed: 03/08/2021 4:44:26 PM By: Deon Pilling RN, BSN Entered By: Deon Pilling on 03/08/2021 14:55:02 -------------------------------------------------------------------------------- Multi-Disciplinary Care Plan Details Patient Name: Date of Service: [name], [name] 03/08/2021 2:15 PM Medical Record Number: [id number] Patient Account Number: [id number] Date of Birth/Sex: Treating RN: 07-12-1926 (88 y.o. [name] [name] Primary Care Amberlyn Martinezgarcia: [name] Other Clinician: Referring Lyndi Holbein: Treating Miriya Cloer/Extender: [name] [name] in Treatment:  24 Active Inactive Tissue Oxygenation Nursing Diagnoses: Potential alteration in peripheral tissue perfusion (select prior to confirmation of diagnosis) Goals: Non-invasive arterial studies are completed as ordered Date Initiated: 09/21/2020 Date Inactivated: 10/19/2020 Target Resolution Date: 09/21/2020 Goal Status: Met Revascularization procedures completed as ordered Date Initiated: 09/21/2020 Target Resolution  Date: 04/28/2021 Goal Status: Active Interventions: Assess patient understanding of disease process and management upon diagnosis and as needed Provide education on tissue oxygenation and ischemia Treatment Activities: Ankle Brachial Index (ABI) : 09/21/2020 Non-invasive vascular studies : 09/21/2020 T ordered outside of clinic : 09/21/2020 est Notes: 10/19/20: Had vascular testing, awaiting appointment to see vascular MD Wound/Skin Impairment Nursing Diagnoses: Knowledge deficit related to ulceration/compromised skin integrity Goals: Patient/caregiver will verbalize understanding of skin care regimen Date Initiated: 09/21/2020 Target Resolution Date: 04/28/2021 Goal Status: Active Interventions: Assess patient/caregiver ability to obtain necessary supplies Assess patient/caregiver ability to perform ulcer/skin care regimen upon admission and as needed Provide education on ulcer and skin care Treatment Activities: Skin care regimen initiated : 09/21/2020 Topical wound management initiated : 09/21/2020 Notes: Electronic Signature(s) Signed: 03/08/2021 4:44:26 PM By: Deon Pilling RN, BSN Entered By: Deon Pilling on 03/08/2021 15:04:54 -------------------------------------------------------------------------------- Pain Assessment Details Patient Name: Date of Service: [name], [name] 03/08/2021 2:15 PM Medical Record Number: [id number] Patient Account Number: [id number] Date of Birth/Sex: Treating RN: 1926-01-27 (88 y.o. [name], [name] Primary Care Gerda Yin: [name] Other Clinician: Referring Tylerjames Hoglund: Treating Cariana Karge/Extender: [name] [name] in Treatment: 24 Active Problems Location of Pain Severity and Description of Pain Patient Has Paino No Site Locations Pain Management and Medication Current Pain Management: Medication: No Cold Application: No Rest: No Massage: No Activity: No T.E.N.S.: No Heat Application: No Leg drop or elevation: No Is the Current Pain Management Adequate: Adequate How does your wound impact your activities of daily livingo Sleep: No Bathing: No Appetite: No Relationship With Others: No Bladder Continence: No Emotions: No Bowel Continence: No Work: No Toileting: No Drive: No Dressing: No Hobbies: No Notes per patient pain at night with numbness. Electronic Signature(s) Signed: 03/08/2021 4:44:26 PM By: Deon Pilling RN, BSN Entered By: Deon Pilling on 03/08/2021 14:40:08 -------------------------------------------------------------------------------- Patient/Caregiver Education Details Patient Name: Date of Service: [name], Shaune Pascal ID I. 3/8/2023andnbsp2:15 PM Medical Record Number: [id number] Patient Account Number: [id number] Date of Birth/Gender: Treating RN: Jun 17, 1926 (88 y.o. [name] [name] Primary Care Physician: [name] Other Clinician: Referring Physician: Treating Physician/Extender: [name] [name] in Treatment: 24 Education Assessment Education Provided To: Patient and Caregiver Education Topics Provided Wound/Skin Impairment: Handouts: Skin Care Do's and Dont's Methods: Explain/Verbal Responses: Reinforcements needed Electronic Signature(s) Signed: 03/08/2021 4:44:26 PM By: Deon Pilling RN, BSN Entered By: Deon Pilling on 03/08/2021 15:05:13 -------------------------------------------------------------------------------- Wound Assessment Details Patient Name: Date of Service: [name], [name] 03/08/2021 2:15 PM Medical  Record Number: [id number] Patient Account Number: [id number] Date of Birth/Sex: Treating RN: August 05, 1926 (88 y.o. [name], [name] Primary Care Waynette Towers: [name] Other Clinician: Referring Kerston Landeck: Treating Taysha Majewski/Extender: [name] in Treatment: 24 Wound Status Wound Number: 1 Primary Pressure Ulcer Etiology: Wound Location: Right, Lateral Calcaneus Wound Open Wounding Event: Pressure Injury Status: Date Acquired: 06/08/2020 Comorbid Cataracts, Sleep Apnea, Arrhythmia, Congestive Heart Failure, Weeks Of Treatment: 24 History: Coronary Artery Disease, Hypertension, Myocardial Infarction, Clustered Wound: No End Stage Renal Disease, Gout, Dementia, Neuropathy Photos Wound Measurements Length: (cm) 0.8 Width: (cm) 1.4 Depth: (cm) 0.2 Area: (cm) 0.88 Volume: (cm) 0.176 % Reduction in Area: -77.8% % Reduction in Volume: -259.2% Epithelialization: Medium (34-66%) Tunneling: No Undermining: No Wound Description Classification: Category/Stage III Wound Margin: Distinct, outline attached Exudate Amount: Medium Exudate Type: Serosanguineous Exudate Color: red, brown Foul Odor After Cleansing: No Slough/Fibrino Yes Wound  Bed Granulation Amount: Small (1-33%) Exposed Structure Granulation Quality: Red Fascia Exposed: No Necrotic Amount: Large (67-100%) Fat Layer (Subcutaneous Tissue) Exposed: Yes Necrotic Quality: Adherent Slough Tendon Exposed: No Muscle Exposed: No Joint Exposed: No Bone Exposed: No Treatment Notes Wound #1 (Calcaneus) Wound Laterality: Right, Lateral Cleanser Soap and Water Discharge Instruction: May shower and wash wound with dial antibacterial soap and water prior to dressing change. Peri-Wound Care Topical Primary Dressing Iodosorb Gel 10 (gm) Tube Discharge Instruction: Apply to wound bed as instructed Secondary Dressing Woven Gauze Sponge, Non-Sterile 4x4 in Discharge Instruction: Apply over primary  dressing as directed. Zetuvit Plus Silicone Border Dressing 4x4 (in/in) Discharge Instruction: Apply silicone border over primary dressing as directed. Secured With [employer], 4.5x3.1 (in/yd) Discharge Instruction: Secure with Kerlix as directed. 58M Medipore H Soft Cloth Surgical T ape, 4 x 10 (in/yd) Discharge Instruction: Secure with tape as directed. Compression Wrap Compression Stockings Add-Ons Electronic Signature(s) Signed: 03/08/2021 4:44:26 PM By: Deon Pilling RN, BSN Entered By: Deon Pilling on 03/08/2021 15:00:06 -------------------------------------------------------------------------------- Wound Assessment Details Patient Name: Date of Service: [name], [name] 03/08/2021 2:15 PM Medical Record Number: [id number] Patient Account Number: [id number] Date of Birth/Sex: Treating RN: [birth year] (88 y.o. [name], [name] Primary Care Celestina Gironda: [name] Other Clinician: Referring Bonney Berres: Treating Radin Raptis/Extender: [name] in Treatment: 24 Wound Status Wound Number: 2 Primary Pressure Ulcer Etiology: Wound Location: Right, Medial Calcaneus Wound Open Wounding Event: Pressure Injury Status: Date Acquired: 10/17/2020 Comorbid Cataracts, Sleep Apnea, Arrhythmia, Congestive Heart Failure, Weeks Of Treatment: 20 History: Coronary Artery Disease, Hypertension, Myocardial Infarction, Clustered Wound: No End Stage Renal Disease, Gout, Dementia, Neuropathy Photos Wound Measurements Length: (cm) 0.6 Width: (cm) 2.2 Depth: (cm) 0.1 Area: (cm) 1.037 Volume: (cm) 0.104 % Reduction in Area: -10.1% % Reduction in Volume: -10.6% Epithelialization: Medium (34-66%) Tunneling: No Undermining: No Wound Description Classification: Category/Stage III Wound Margin: Distinct, outline attached Exudate Amount: Medium Exudate Type: Serosanguineous Exudate Color: red, brown Foul Odor After Cleansing: No Slough/Fibrino Yes Wound  Bed Granulation Amount: Large (67-100%) Exposed Structure Granulation Quality: Red, Pink, Friable Fascia Exposed: No Necrotic Amount: Small (1-33%) Fat Layer (Subcutaneous Tissue) Exposed: Yes Necrotic Quality: Adherent Slough Tendon Exposed: No Muscle Exposed: No Joint Exposed: No Bone Exposed: No Treatment Notes Wound #2 (Calcaneus) Wound Laterality: Right, Medial Cleanser Soap and Water Discharge Instruction: May shower and wash wound with dial antibacterial soap and water prior to dressing change. Peri-Wound Care Topical Primary Dressing Iodosorb Gel 10 (gm) Tube Discharge Instruction: Apply to wound bed as instructed Secondary Dressing Woven Gauze Sponge, Non-Sterile 4x4 in Discharge Instruction: Apply over primary dressing as directed. Zetuvit Plus Silicone Border Dressing 4x4 (in/in) Discharge Instruction: Apply silicone border over primary dressing as directed. Secured With [employer], 4.5x3.1 (in/yd) Discharge Instruction: Secure with Kerlix as directed. 58M Medipore H Soft Cloth Surgical T ape, 4 x 10 (in/yd) Discharge Instruction: Secure with tape as directed. Compression Wrap Compression Stockings Add-Ons Electronic Signature(s) Signed: 03/08/2021 4:44:26 PM By: Deon Pilling RN, BSN Entered By: Deon Pilling on 03/08/2021 15:00:43 -------------------------------------------------------------------------------- Wound Assessment Details Patient Name: Date of Service: [name], [name] 03/08/2021 2:15 PM Medical Record Number: [id number] Patient Account Number: [id number] Date of Birth/Sex: Treating RN: 03-17-1926 (88 y.o. [name] [name] Primary Care Deondray Ospina: [name] Other Clinician: Referring Kamoria Lucien: Treating Ormand Senn/Extender: [name] in Treatment: 24 Wound Status Wound Number: 3 Primary Venous Leg Ulcer Etiology:  Wound Location: Left, Lateral Ankle Wound Open Wounding Event: Gradually  Appeared Status: Date Acquired: 10/12/2020 Comorbid Cataracts, Sleep Apnea, Arrhythmia, Congestive Heart Failure, Weeks Of Treatment: 16 History: Coronary Artery Disease, Hypertension, Myocardial Infarction, Clustered Wound: No End Stage Renal Disease, Gout, Dementia, Neuropathy Photos Wound Measurements Length: (cm) 2.2 Width: (cm) 1.5 Depth: (cm) 0.5 Area: (cm) 2.592 Volume: (cm) 1.296 % Reduction in Area: -415.3% % Reduction in Volume: -758.3% Epithelialization: Small (1-33%) Tunneling: No Undermining: No Wound Description Classification: Full Thickness Without Exposed Support Structures Wound Margin: Distinct, outline attached Exudate Amount: Medium Exudate Type: Serosanguineous Exudate Color: red, brown Foul Odor After Cleansing: No Slough/Fibrino Yes Wound Bed Granulation Amount: Small (1-33%) Exposed Structure Granulation Quality: Red, Friable Fascia Exposed: No Necrotic Amount: Large (67-100%) Fat Layer (Subcutaneous Tissue) Exposed: Yes Necrotic Quality: Eschar, Adherent Slough Tendon Exposed: No Muscle Exposed: No Joint Exposed: No Bone Exposed: No Treatment Notes Wound #3 (Ankle) Wound Laterality: Left, Lateral Cleanser Soap and Water Discharge Instruction: May shower and wash wound with dial antibacterial soap and water prior to dressing change. Peri-Wound Care Topical Primary Dressing Iodosorb Gel 10 (gm) Tube Discharge Instruction: Apply to wound bed as instructed Secondary Dressing Woven Gauze Sponge, Non-Sterile 4x4 in Discharge Instruction: Apply over primary dressing as directed. Zetuvit Plus Silicone Border Dressing 4x4 (in/in) Discharge Instruction: Apply silicone border over primary dressing as directed. Secured With [employer], 4.5x3.1 (in/yd) Discharge Instruction: Secure with Kerlix as directed. 84M Medipore H Soft Cloth Surgical T ape, 4 x 10 (in/yd) Discharge Instruction: Secure with tape as directed. Compression  Wrap Compression Stockings Add-Ons Electronic Signature(s) Signed: 03/08/2021 4:44:26 PM By: Deon Pilling RN, BSN Entered By: Deon Pilling on 03/08/2021 15:01:20 -------------------------------------------------------------------------------- Wound Assessment Details Patient Name: Date of Service: [name], [name] 03/08/2021 2:15 PM Medical Record Number: [id number] Patient Account Number: [id number] Date of Birth/Sex: Treating RN: June 19, 1926 (88 y.o. [name], [name] Primary Care Paulene Tayag: [name] Other Clinician: Referring Cung Masterson: Treating Thessaly Mccullers/Extender: [name] in Treatment: 24 Wound Status Wound Number: 4 Primary Abscess Etiology: Wound Location: Left, Medial Lower Leg Wound Open Wounding Event: Other Lesion Status: Date Acquired: 11/16/2020 Comorbid Cataracts, Sleep Apnea, Arrhythmia, Congestive Heart Failure, Weeks Of Treatment: 16 History: Coronary Artery Disease, Hypertension, Myocardial Infarction, Clustered Wound: No End Stage Renal Disease, Gout, Dementia, Neuropathy Photos Wound Measurements Length: (cm) 0.6 Width: (cm) 0.5 Depth: (cm) 0.4 Area: (cm) 0.236 Volume: (cm) 0.094 % Reduction in Area: -20.4% % Reduction in Volume: 46.9% Epithelialization: Medium (34-66%) Tunneling: No Undermining: No Wound Description Classification: Full Thickness Without Exposed Support Structures Wound Margin: Well defined, not attached Exudate Amount: Medium Exudate Type: Serosanguineous Exudate Color: red, brown Foul Odor After Cleansing: No Slough/Fibrino Yes Wound Bed Granulation Amount: Large (67-100%) Exposed Structure Granulation Quality: Red, Pink Fascia Exposed: No Necrotic Amount: Small (1-33%) Fat Layer (Subcutaneous Tissue) Exposed: Yes Necrotic Quality: Adherent Slough Tendon Exposed: No Muscle Exposed: No Joint Exposed: No Bone Exposed: No Treatment Notes Wound #4 (Lower Leg) Wound Laterality: Left,  Medial Cleanser Soap and Water Discharge Instruction: May shower and wash wound with dial antibacterial soap and water prior to dressing change. Peri-Wound Care Topical Primary Dressing Iodosorb Gel 10 (gm) Tube Discharge Instruction: Apply to wound bed as instructed Secondary Dressing Woven Gauze Sponge, Non-Sterile 4x4 in Discharge Instruction: Apply over primary dressing as directed. Zetuvit Plus Silicone Border Dressing 4x4 (in/in) Discharge Instruction: Apply silicone border over primary dressing as directed. Secured With [employer], 4.5x3.1 (  in/yd) Discharge Instruction: Secure with Kerlix as directed. 31M Medipore H Soft Cloth Surgical T ape, 4 x 10 (in/yd) Discharge Instruction: Secure with tape as directed. Compression Wrap Compression Stockings Add-Ons Electronic Signature(s) Signed: 03/08/2021 4:44:26 PM By: Deon Pilling RN, BSN Entered By: Deon Pilling on 03/08/2021 15:01:56 -------------------------------------------------------------------------------- Wound Assessment Details Patient Name: Date of Service: [name], [name] 03/08/2021 2:15 PM Medical Record Number: [id number] Patient Account Number: [id number] Date of Birth/Sex: Treating RN: 08/13/1926 (88 y.o. [name], [name] Primary Care Braedyn Riggle: [name] Other Clinician: Referring Zyair Rhein: Treating Romie Keeble/Extender: [name] in Treatment: 24 Wound Status Wound Number: 6 Primary Arterial Insufficiency Ulcer Etiology: Wound Location: Right, Lateral Ankle Wound Open Wounding Event: Gradually Appeared Status: Date Acquired: 11/22/2020 Comorbid Cataracts, Sleep Apnea, Arrhythmia, Congestive Heart Failure, Weeks Of Treatment: 11 History: Coronary Artery Disease, Hypertension, Myocardial Infarction, Clustered Wound: No End Stage Renal Disease, Gout, Dementia, Neuropathy Photos Wound Measurements Length: (cm) 1.5 Width: (cm) 0.9 Depth: (cm) 0.6 Area: (cm)  1.06 Volume: (cm) 0.636 % Reduction in Area: -285.5% % Reduction in Volume: -364.2% Epithelialization: Small (1-33%) Tunneling: No Undermining: No Wound Description Classification: Full Thickness Without Exposed Support Structures Wound Margin: Distinct, outline attached Exudate Amount: Medium Exudate Type: Serosanguineous Exudate Color: red, brown Foul Odor After Cleansing: No Slough/Fibrino Yes Wound Bed Granulation Amount: None Present (0%) Exposed Structure Necrotic Amount: Large (67-100%) Fascia Exposed: No Necrotic Quality: Adherent Slough Fat Layer (Subcutaneous Tissue) Exposed: Yes Tendon Exposed: No Muscle Exposed: No Joint Exposed: No Bone Exposed: No Treatment Notes Wound #6 (Ankle) Wound Laterality: Right, Lateral Cleanser Soap and Water Discharge Instruction: May shower and wash wound with dial antibacterial soap and water prior to dressing change. Peri-Wound Care Topical Primary Dressing Iodosorb Gel 10 (gm) Tube Discharge Instruction: Apply to wound bed as instructed Secondary Dressing Woven Gauze Sponge, Non-Sterile 4x4 in Discharge Instruction: Apply over primary dressing as directed. Zetuvit Plus Silicone Border Dressing 4x4 (in/in) Discharge Instruction: Apply silicone border over primary dressing as directed. Secured With [employer], 4.5x3.1 (in/yd) Discharge Instruction: Secure with Kerlix as directed. 31M Medipore H Soft Cloth Surgical T ape, 4 x 10 (in/yd) Discharge Instruction: Secure with tape as directed. Compression Wrap Compression Stockings Add-Ons Electronic Signature(s) Signed: 03/08/2021 4:44:26 PM By: Deon Pilling RN, BSN Entered By: Deon Pilling on 03/08/2021 15:04:19 -------------------------------------------------------------------------------- Vitals Details Patient Name: Date of Service: [name], [name] 03/08/2021 2:15 PM Medical Record Number: [id number] Patient Account Number: [id number] Date of  Birth/Sex: Treating RN: 02/03/1926 (88 y.o. [name] [name] Primary Care Wade Asebedo: [name] Other Clinician: Referring Ludie Pavlik: Treating Noris Kulinski/Extender: [name] [name] in Treatment: 24 Vital Signs Time Taken: 14:35 Temperature (F): 97.5 Height (in): 72 Pulse (bpm): 61 Weight (lbs): 223 Respiratory Rate (breaths/min): 20 Body Mass Index (BMI): 30.2 Blood Pressure (mmHg): 160/74 Reference Range: 80 - 120 mg / dl Electronic Signature(s) Signed: 03/08/2021 4:44:26 PM By: Deon Pilling RN, BSN Entered By: Deon Pilling on 03/08/2021 14:39:36

## 2021-03-12 DIAGNOSIS — N39498 Other specified urinary incontinence: Secondary | ICD-10-CM | POA: Diagnosis not present

## 2021-03-12 DIAGNOSIS — G4733 Obstructive sleep apnea (adult) (pediatric): Secondary | ICD-10-CM | POA: Diagnosis not present

## 2021-03-12 DIAGNOSIS — I259 Chronic ischemic heart disease, unspecified: Secondary | ICD-10-CM | POA: Diagnosis not present

## 2021-03-12 DIAGNOSIS — N401 Enlarged prostate with lower urinary tract symptoms: Secondary | ICD-10-CM | POA: Diagnosis not present

## 2021-03-12 DIAGNOSIS — I130 Hypertensive heart and chronic kidney disease with heart failure and stage 1 through stage 4 chronic kidney disease, or unspecified chronic kidney disease: Secondary | ICD-10-CM | POA: Diagnosis not present

## 2021-03-12 DIAGNOSIS — M8008XD Age-related osteoporosis with current pathological fracture, vertebra(e), subsequent encounter for fracture with routine healing: Secondary | ICD-10-CM | POA: Diagnosis not present

## 2021-03-14 ENCOUNTER — Encounter (HOSPITAL_COMMUNITY): Payer: Self-pay

## 2021-03-14 ENCOUNTER — Emergency Department (HOSPITAL_COMMUNITY)
Admission: EM | Admit: 2021-03-14 | Discharge: 2021-03-16 | Disposition: A | Discharge status: Skilled Nursing Facility | Payer: Medicare Other | Attending: Emergency Medicine | Admitting: Emergency Medicine

## 2021-03-14 ENCOUNTER — Other Ambulatory Visit: Payer: Self-pay

## 2021-03-14 ENCOUNTER — Emergency Department (HOSPITAL_COMMUNITY): Payer: Medicare Other

## 2021-03-14 DIAGNOSIS — M5442 Lumbago with sciatica, left side: Secondary | ICD-10-CM | POA: Diagnosis not present

## 2021-03-14 DIAGNOSIS — R531 Weakness: Secondary | ICD-10-CM | POA: Diagnosis not present

## 2021-03-14 DIAGNOSIS — R7989 Other specified abnormal findings of blood chemistry: Secondary | ICD-10-CM | POA: Insufficient documentation

## 2021-03-14 DIAGNOSIS — M5441 Lumbago with sciatica, right side: Secondary | ICD-10-CM | POA: Diagnosis not present

## 2021-03-14 DIAGNOSIS — Z7982 Long term (current) use of aspirin: Secondary | ICD-10-CM | POA: Insufficient documentation

## 2021-03-14 DIAGNOSIS — M5416 Radiculopathy, lumbar region: Secondary | ICD-10-CM | POA: Diagnosis not present

## 2021-03-14 DIAGNOSIS — M549 Dorsalgia, unspecified: Secondary | ICD-10-CM | POA: Diagnosis not present

## 2021-03-14 DIAGNOSIS — J90 Pleural effusion, not elsewhere classified: Secondary | ICD-10-CM | POA: Insufficient documentation

## 2021-03-14 DIAGNOSIS — M5137 Other intervertebral disc degeneration, lumbosacral region: Secondary | ICD-10-CM | POA: Diagnosis not present

## 2021-03-14 DIAGNOSIS — R7981 Abnormal blood-gas level: Secondary | ICD-10-CM | POA: Insufficient documentation

## 2021-03-14 DIAGNOSIS — R3989 Other symptoms and signs involving the genitourinary system: Secondary | ICD-10-CM | POA: Diagnosis not present

## 2021-03-14 DIAGNOSIS — Z7901 Long term (current) use of anticoagulants: Secondary | ICD-10-CM | POA: Insufficient documentation

## 2021-03-14 DIAGNOSIS — M5136 Other intervertebral disc degeneration, lumbar region: Secondary | ICD-10-CM | POA: Diagnosis not present

## 2021-03-14 DIAGNOSIS — M545 Low back pain, unspecified: Secondary | ICD-10-CM | POA: Diagnosis not present

## 2021-03-14 DIAGNOSIS — Z79899 Other long term (current) drug therapy: Secondary | ICD-10-CM | POA: Diagnosis not present

## 2021-03-14 DIAGNOSIS — R109 Unspecified abdominal pain: Secondary | ICD-10-CM | POA: Diagnosis not present

## 2021-03-14 DIAGNOSIS — R600 Localized edema: Secondary | ICD-10-CM | POA: Insufficient documentation

## 2021-03-14 DIAGNOSIS — I10 Essential (primary) hypertension: Secondary | ICD-10-CM | POA: Diagnosis not present

## 2021-03-14 DIAGNOSIS — M2578 Osteophyte, vertebrae: Secondary | ICD-10-CM | POA: Diagnosis not present

## 2021-03-14 LAB — CBC WITH DIFFERENTIAL/PLATELET
Abs Immature Granulocytes: 0.03 10*3/uL (ref 0.00–0.07)
Basophils Absolute: 0 10*3/uL (ref 0.0–0.1)
Basophils Relative: 0 %
Eosinophils Absolute: 0 10*3/uL (ref 0.0–0.5)
Eosinophils Relative: 0 %
HCT: 30.7 % — ABNORMAL LOW (ref 39.0–52.0)
Hemoglobin: 9.5 g/dL — ABNORMAL LOW (ref 13.0–17.0)
Immature Granulocytes: 0 %
Lymphocytes Relative: 11 %
Lymphs Abs: 0.7 10*3/uL (ref 0.7–4.0)
MCH: 28.7 pg (ref 26.0–34.0)
MCHC: 30.9 g/dL (ref 30.0–36.0)
MCV: 92.7 fL (ref 80.0–100.0)
Monocytes Absolute: 0.8 10*3/uL (ref 0.1–1.0)
Monocytes Relative: 12 %
Neutro Abs: 5.1 10*3/uL (ref 1.7–7.7)
Neutrophils Relative %: 77 %
Platelets: 92 10*3/uL — ABNORMAL LOW (ref 150–400)
RBC: 3.31 MIL/uL — ABNORMAL LOW (ref 4.22–5.81)
RDW: 18.5 % — ABNORMAL HIGH (ref 11.5–15.5)
WBC: 6.8 10*3/uL (ref 4.0–10.5)
nRBC: 0 % (ref 0.0–0.2)

## 2021-03-14 LAB — BASIC METABOLIC PANEL
Anion gap: 14 (ref 5–15)
BUN: 30 mg/dL — ABNORMAL HIGH (ref 8–23)
CO2: 21 mmol/L — ABNORMAL LOW (ref 22–32)
Calcium: 8.9 mg/dL (ref 8.9–10.3)
Chloride: 100 mmol/L (ref 98–111)
Creatinine, Ser: 1.5 mg/dL — ABNORMAL HIGH (ref 0.61–1.24)
GFR, Estimated: 43 mL/min — ABNORMAL LOW (ref >60)
Glucose, Bld: 109 mg/dL — ABNORMAL HIGH (ref 70–99)
Potassium: 4.8 mmol/L (ref 3.5–5.1)
Sodium: 135 mmol/L (ref 135–145)

## 2021-03-14 MED ORDER — LIDOCAINE 5 % EX PTCH: 1.0000 | MEDICATED_PATCH | CUTANEOUS | Status: DC

## 2021-03-14 MED ORDER — FENTANYL CITRATE PF 50 MCG/ML IJ SOSY
50.0000 ug | PREFILLED_SYRINGE | Freq: Once | INTRAMUSCULAR | Status: AC
  Administered 2021-03-14: 50 ug via INTRAVENOUS
  Filled 2021-03-14: qty 1

## 2021-03-14 MED ORDER — SODIUM CHLORIDE 0.9 % IV BOLUS
500.0000 mL | Freq: Once | INTRAVENOUS | Status: AC
  Administered 2021-03-14: 500 mL via INTRAVENOUS

## 2021-03-14 MED ORDER — IOHEXOL 350 MG/ML SOLN
100.0000 mL | Freq: Once | INTRAVENOUS | Status: AC | PRN
  Administered 2021-03-14: 100 mL via INTRAVENOUS

## 2021-03-14 NOTE — ED Provider Notes (Signed)
[facility]-EMERGENCY DEPT Provider Note   CSN: [id number] Arrival date & time: 03/14/21  1850     History  Chief Complaint  Patient presents with   Back Pain    [name] [name] is a 88 y.o. male.  He has a history of cardiac disease TAVR on anticoagulation AAA peripheral vascular disease.  He said last year he had compression fracture L1 that received kyphoplasty.  He had another compression fracture of his thoracic spine.  He has been using pain medicine and doing physical therapy.  He said he had physical therapy 3 days ago.  Since then his low back is been bothering him.  He went to [facility] today and they did x-rays and did not see any new changes.  He was home resting in his recliner when he found that his back pain was more severe and he could not get up out of the chair.  No recent trauma.  No abdominal pain.  He is got pain radiating down into his thighs.  He has chronic wounds of his ankles from his peripheral vascular disease and not much use of his left foot.  No chest pain or shortness of breath fevers or chills.  No urinary symptoms.  The history is provided by the patient.  Back Pain Location:  Lumbar spine and sacro-iliac joint Quality:  Stabbing Radiates to:  L thigh and R thigh Pain severity:  Severe Pain is:  Same all the time Onset quality:  Sudden Timing:  Constant Progression:  Unchanged Chronicity:  New Context: not recent injury   Relieved by:  Nothing Worsened by:  Movement Ineffective treatments:  None tried Associated symptoms: leg pain and weakness   Associated symptoms: no abdominal pain, no bladder incontinence, no bowel incontinence, no chest pain, no dysuria, no fever and no headaches   Risk factors: vascular disease       Home Medications Prior to Admission medications   Medication Sig Start Date End Date Taking? Authorizing Provider  acetaminophen (TYLENOL) 500 MG tablet Take 2 tablets (1,000 mg total) by mouth 3 (three)  times daily. For 3 to 5 days and then change to 1000 mg 3 times daily as needed for pain. Patient taking differently: Take 500 mg by mouth as needed for mild pain. 08/23/20   Hongalgi, Maximino Greenland, MD  amiodarone (PACERONE) 200 MG tablet Take 1 tablet (200 mg total) by mouth daily. 03/09/20   Cantwell, Celeste C, PA-C  amLODipine (NORVASC) 5 MG tablet amlodipine 5 mg tablet  TAKE 1 TABLET BY MOUTH DAILY    [provider]  aspirin EC 81 MG EC tablet Take 1 tablet (81 mg total) by mouth daily. Swallow whole. 10/28/20   Meredeth Ide, MD  atorvastatin (LIPITOR) 40 MG tablet Take 1 tablet (40 mg total) by mouth daily. 01/20/21   Cantwell, Celeste C, PA-C  cholecalciferol (VITAMIN D3) 25 MCG (1000 UNIT) tablet Take 1,000 Units by mouth daily.     [provider]  collagenase (SANTYL) ointment Santyl 250 unit/gram topical ointment    [provider]  docusate sodium (COLACE) 100 MG capsule Take 1 capsule (100 mg total) by mouth 2 (two) times daily. 08/23/20   Hongalgi, Maximino Greenland, MD  EPINEPHrine 0.3 mg/0.3 mL IJ SOAJ injection Inject 0.3 mLs (0.3 mg total) into the muscle as needed for anaphylaxis. Patient taking differently: Inject 0.3 mg into the muscle once as needed for anaphylaxis. 08/08/18   Hetty Blend, FNP  ezetimibe (ZETIA)  10 MG tablet Take 10 mg by mouth daily.    [provider]  finasteride (PROSCAR) 5 MG tablet Take 5 mg by mouth daily. 09/15/20   [provider]  fish oil-omega-3 fatty acids 1000 MG capsule Take 1 g by mouth daily.     [provider]  fluticasone (FLONASE) 50 MCG/ACT nasal spray Place 1 spray into both nostrils daily as needed for allergies.    [provider]  gabapentin (NEURONTIN) 100 MG capsule Take 100 mg by mouth 4 (four) times daily.    [provider]  gabapentin (NEURONTIN) 100 MG capsule 1 capsule    [provider]  galantamine (RAZADYNE ER) 8 MG 24 hr capsule Take 8 mg by mouth Daily. 02/26/11    [provider]  HONEY BEE VENOM IJ Inject 2 each as directed See admin instructions. Every eight weeks    [provider]  hydrALAZINE (APRESOLINE) 10 MG tablet hydralazine 10 mg tablet  TAKE 1 TABLET BY MOUTH TWICE DAILY WITH FOOD    [provider]  hydrALAZINE (APRESOLINE) 25 MG tablet Take 25 mg by mouth 2 (two) times daily. 08/09/20   [provider]  isosorbide mononitrate (IMDUR) 60 MG 24 hr tablet TAKE 1 TABLET(60 MG) BY MOUTH DAILY AFTER SUPPER Patient taking differently: Take 60 mg by mouth daily. 10/10/20   Yates Decamp, MD  leflunomide (ARAVA) 10 MG tablet Take 10 mg by mouth daily. 08/24/20   [provider]  leflunomide (ARAVA) 20 MG tablet leflunomide 20 mg tablet  TAKE 1 TABLET BY MOUTH EVERY DAY    [provider]  magnesium gluconate (MAGONATE) 500 MG tablet Take 500 mg by mouth daily.    [provider]  memantine (NAMENDA) 10 MG tablet Take 10 mg by mouth 2 (two) times daily. 09/08/19   [provider]  memantine (NAMENDA) 5 MG tablet memantine 5 mg tablet    [provider]  metoprolol succinate (TOPROL-XL) 25 MG 24 hr tablet Take 0.5 tablets (12.5 mg total) by mouth daily as needed (for high heart rate, consistently greater than 110/min.). 02/20/21   Cantwell, Celeste C, PA-C  nitroGLYCERIN (NITROSTAT) 0.4 MG SL tablet Place 1 tablet (0.4 mg total) under the tongue every 5 (five) minutes x 3 doses as needed for chest pain. 12/16/19   Cantwell, Celeste C, PA-C  pantoprazole (PROTONIX) 40 MG tablet Take 40 mg by mouth every other day.  10/28/14   [provider]  polyethylene glycol (MIRALAX / GLYCOLAX) 17 g packet Take 17 g by mouth 2 (two) times daily. 08/23/20   Hongalgi, Maximino Greenland, MD  potassium chloride (MICRO-K) 10 MEQ CR capsule TAKE ONE CAPSULE BY MOUTH EVERY DAY WITH LASIX 02/16/21   Cantwell, Celeste C, PA-C  predniSONE (DELTASONE) 5 MG tablet Take 5 mg by mouth daily. 12/18/20   [provider]  PRESCRIPTION MEDICATION Inhale 1 each into the lungs at bedtime. Cpap    [provider]  Psyllium Husk POWD Take 1 Scoop by mouth daily.     [provider]  Rivaroxaban (XARELTO) 15 MG TABS tablet Take 15 mg by mouth daily with supper.    [provider]  torsemide (DEMADEX) 20 MG tablet Take 2 tablets (40 mg total) by mouth daily. 09/16/20   Cantwell, Celeste C, PA-C  triamcinolone cream (KENALOG) 0.1 % Apply 1 application topically daily as needed (skin irritation).    [provider]  vitamin B-12 (CYANOCOBALAMIN) 1000 MCG tablet  Take 1,000 mcg by mouth daily.     [provider]      Allergies    Amoxicillin, Bee venom, Avelox [moxifloxacin], Codeine, Duloxetine hcl, Levitra [vardenafil], Morphine and related, Penicillin g sodium, Testosterone, and Viagra [sildenafil]    Review of Systems   Review of Systems  Constitutional:  Negative for fever.  HENT:  Negative for sore throat.   Eyes:  Negative for visual disturbance.  Respiratory:  Negative for shortness of breath.   Cardiovascular:  Negative for chest pain.  Gastrointestinal:  Negative for abdominal pain and bowel incontinence.  Genitourinary:  Negative for bladder incontinence and dysuria.  Musculoskeletal:  Positive for back pain and gait problem.  Skin:  Positive for wound. Negative for rash.  Neurological:  Positive for weakness. Negative for headaches.   Physical Exam Updated Vital Signs BP (!) 160/70   Pulse 62   Temp 97.6 F (36.4 C) (Oral)   Resp (!) 24   Ht 6' (1.829 m)   Wt 95.3 kg   SpO2 93%   BMI 28.48 kg/m  Physical Exam Vitals and nursing note reviewed.  Constitutional:      General: He is in acute distress.     Appearance: Normal appearance. He is well-developed.  HENT:     Head: Normocephalic and atraumatic.  Eyes:     Conjunctiva/sclera: Conjunctivae normal.  Cardiovascular:     Rate and Rhythm: Normal rate and regular rhythm.      Heart sounds: No murmur heard. Pulmonary:     Effort: Pulmonary effort is normal. No respiratory distress.     Breath sounds: Normal breath sounds.  Abdominal:     Palpations: Abdomen is soft.     Tenderness: There is no abdominal tenderness. There is no guarding or rebound.  Musculoskeletal:        General: Tenderness present. No swelling.     Cervical back: Neck supple.     Right lower leg: Edema present.     Left lower leg: Edema present.  Skin:    General: Skin is warm and dry.     Capillary Refill: Capillary refill takes less than 2 seconds.  Neurological:     Mental Status: He is alert.     Cranial Nerves: No cranial nerve deficit.     Comments: He has decreased ankle flexion of his left foot which he says is old for him.  Psychiatric:        Mood and Affect: Mood normal.    ED Results / Procedures / Treatments   Labs (all labs ordered are listed, but only abnormal results are displayed) Labs Reviewed  BASIC METABOLIC PANEL - Abnormal; Notable for the following components:      Result Value   CO2 21 (*)    Glucose, Bld 109 (*)    BUN 30 (*)    Creatinine, Ser 1.50 (*)    GFR, Estimated 43 (*)    All other components within normal limits  URINALYSIS, ROUTINE W REFLEX MICROSCOPIC - Abnormal; Notable for the following components:   APPearance HAZY (*)    Hgb urine dipstick SMALL (*)    Leukocytes,Ua MODERATE (*)    Bacteria, UA RARE (*)    All other components within normal limits  CBC WITH DIFFERENTIAL/PLATELET - Abnormal; Notable for the following components:   RBC 3.31 (*)    Hemoglobin 9.5 (*)    HCT 30.7 (*)    RDW 18.5 (*)    Platelets 92 (*)  All other components within normal limits  URINE CULTURE  CBC WITH DIFFERENTIAL/PLATELET    EKG None  Radiology CT L-SPINE NO CHARGE  Result Date: 03/14/2021 CLINICAL DATA:  Back pain with history of fractures EXAM: CT LUMBAR SPINE WITHOUT CONTRAST TECHNIQUE: Multidetector CT imaging of the lumbar spine was  performed without intravenous contrast administration. Multiplanar CT image reconstructions were also generated. RADIATION DOSE REDUCTION: This exam was performed according to the departmental dose-optimization program which includes automated exposure control, adjustment of the mA and/or kV according to patient size and/or use of iterative reconstruction technique. COMPARISON:  08/17/2020 FINDINGS: Segmentation: 5 lumbar type vertebral bodies. Alignment: Mild retropulsion of L1 fragments, unchanged since prior study. Otherwise normal alignments. Vertebrae: Compression of the L1 vertebra with some retropulsion of fracture fragments. Previous kyphoplasty cement. Appearance is unchanged since the prior study. No new compression deformities. Diffuse bone demineralization. No focal bone lesion or bone destruction. Visualized sacrum appears intact. Paraspinal and other soft tissues: No abnormal paraspinal soft tissue mass or infiltration. Small bilateral pleural effusions are seen. Calcification of the aorta. Aortic aneurysm is demonstrated with maximal AP diameter of 4.3 cm. Similar appearance to previous study. Disc levels: Degenerative disc space narrowing most prominent at L1-2 and L5-S1. Degenerative disc disease at L1-2. Endplate osteophyte formation throughout. There is evidence of moderate to severe central stenosis at T12-L1, L1-2, L2-3, L3-4, L4-5 levels. Degenerative changes in the facet joints. IMPRESSION: 1. Old compression of L1 post kyphoplasty. Retropulsion of fracture fragments. No change in appearance since prior study. 2. No new acute fractures identified. 3. Diffuse multilevel degenerative changes. 4. 4.3 cm diameter abdominal aortic aneurysm is unchanged. Bilateral pleural effusions. Electronically Signed   By: Burman Nieves M.D.   On: 03/14/2021 23:26   CT Renal Stone Study  Result Date: 03/14/2021 CLINICAL DATA:  Flank pain, kidney stone suspected. EXAM: CT ABDOMEN AND PELVIS WITHOUT  CONTRAST TECHNIQUE: Multidetector CT imaging of the abdomen and pelvis was performed following the standard protocol without IV contrast. RADIATION DOSE REDUCTION: This exam was performed according to the departmental dose-optimization program which includes automated exposure control, adjustment of the mA and/or kV according to patient size and/or use of iterative reconstruction technique. COMPARISON:  10/13/2019 FINDINGS: Lower chest: Moderate bilateral pleural effusions with basilar atelectasis. Somewhat patchy infiltration in the lung bases may represent pneumonia. Bronchiectasis and bronchial wall thickening. Cardiac enlargement. Postoperative changes in the mediastinum. Hepatobiliary: No focal liver abnormality is seen. Status post cholecystectomy. No biliary dilatation. Pancreas: Unremarkable. No pancreatic ductal dilatation or surrounding inflammatory changes. Spleen: Normal in size without focal abnormality. Adrenals/Urinary Tract: No adrenal gland nodules. Benign-appearing cysts on the kidneys, largest on the right measuring 2.6 cm diameter. No change since prior study. No follow-up is indicated. Mild diffuse parenchymal atrophy of both kidneys. No hydronephrosis or hydroureter. No renal or ureteral stone or obstruction. There is increased density demonstrated in the renal collecting systems and bladder. In the absence of IV contrast material this suggest possible hemorrhage, proteinaceous fluid, or infection. No bladder wall thickening. Stomach/Bowel: Stomach is within normal limits. Appendix appears normal. No evidence of bowel wall thickening, distention, or inflammatory changes. Colonic diverticulosis without evidence of acute diverticulitis. Vascular/Lymphatic: Diffuse aortic calcification. Prominent calcification at the origins of the SMA, renal arteries, and throughout the iliac arteries bilaterally. Abdominal aortic aneurysm measuring 4.3 cm maximal diameter without change since prior study.  Reproductive: Prostate is unremarkable. Other: Small left inguinal hernia containing fat. No free air or free fluid in the abdomen.  Musculoskeletal: Degenerative changes in the lumbar spine. Compression of the L1 vertebra post kyphoplasty. No acute displaced fractures are identified. IMPRESSION: 1. No renal or ureteral stone or obstruction. 2. Increased density of urine in the renal collecting systems and bladder may indicate hemorrhage, infection, or increased protein. Correlate with urinalysis. 3. Moderate bilateral pleural effusions with basilar atelectasis. Patchy infiltration in the lower lungs may indicate pneumonia. 4. Aortic atherosclerosis. 4.3 cm abdominal aortic aneurysm. No change since prior study. Recommend follow-up every 12 months and vascular consultation. Reference: J Am Coll Radiol 2013;10:789-794. 5. Small left inguinal hernia containing fat. Electronically Signed   By: Burman Nieves M.D.   On: 03/14/2021 23:32    Procedures Procedures    Medications Ordered in ED Medications  lidocaine (LIDODERM) 5 % 1 patch (1 patch Transdermal Patient Refused/Not Given 03/15/21 0206)  amiodarone (PACERONE) tablet 200 mg (200 mg Oral Given 03/15/21 1013)  aspirin EC tablet 81 mg (81 mg Oral Given 03/15/21 1010)  atorvastatin (LIPITOR) tablet 40 mg (40 mg Oral Given 03/15/21 1011)  ezetimibe (ZETIA) tablet 10 mg (10 mg Oral Given 03/15/21 1012)  finasteride (PROSCAR) tablet 5 mg (5 mg Oral Given 03/15/21 1013)  gabapentin (NEURONTIN) capsule 100 mg (100 mg Oral Given 03/15/21 1826)  hydrALAZINE (APRESOLINE) tablet 25 mg (25 mg Oral Given 03/15/21 1011)  isosorbide mononitrate (IMDUR) 24 hr tablet 60 mg (60 mg Oral Patient Refused/Not Given 03/15/21 1015)  memantine (NAMENDA) tablet 10 mg (10 mg Oral Given 03/15/21 1010)  metoprolol succinate (TOPROL-XL) 24 hr tablet 12.5 mg (has no administration in time range)  pantoprazole (PROTONIX) EC tablet 40 mg (40 mg Oral Given 03/15/21 1010)  Rivaroxaban  (XARELTO) tablet 15 mg (15 mg Oral Given 03/15/21 1826)  torsemide (DEMADEX) tablet 40 mg (40 mg Oral Given 03/15/21 1824)  HYDROcodone-acetaminophen (NORCO/VICODIN) 5-325 MG per tablet 1-2 tablet (2 tablets Oral Given 03/15/21 1512)  docusate sodium (COLACE) capsule 100 mg (100 mg Oral Given 03/15/21 1010)  galantamine (RAZADYNE ER) 24 hr capsule 8 mg (has no administration in time range)  fentaNYL (SUBLIMAZE) injection 50 mcg (50 mcg Intravenous Given 03/14/21 1956)  sodium chloride 0.9 % bolus 500 mL (0 mLs Intravenous Stopped 03/15/21 0008)  iohexol (OMNIPAQUE) 350 MG/ML injection 100 mL (100 mLs Intravenous Contrast Given 03/14/21 2207)  fentaNYL (SUBLIMAZE) injection 50 mcg (50 mcg Intravenous Given 03/14/21 2229)  fentaNYL (SUBLIMAZE) injection 50 mcg (50 mcg Intravenous Given 03/14/21 2349)  potassium chloride (KLOR-CON M) CR tablet 10 mEq (10 mEq Oral Given 03/15/21 1011)    ED Course/ Medical Decision Making/ A&P Clinical Course as of 03/15/21 1832  Tue Mar 14, 2021  2324 Inclined to getting a CT with contrast even though his renal function was okay.  Hemoglobin stable although platelets are slightly lower than priors.  Creatinine slightly worse.  Bicarb about the same.  Given IV fluids. [MB]  Wed Mar 15, 2021  0004 Wife is here and states he sometimes has difficulty urinating and required a urinary catheter in the past.  She thinks we may need to do an in and out catheter to Check his urine. [MB]  0030 Wife does not think she is going to be able to handle him at home with his increased disability at this time.  Will restart his meds and put him in for PT and social work consult in the morning. [MB]    Clinical Course User Index [MB] Terrilee Files, MD  Medical Decision Making Amount and/or Complexity of Data Reviewed Labs: ordered. Radiology: ordered.  Risk OTC drugs. Prescription drug management.  This patient complains of low back pain radiating into  the thighs; this involves an extensive number of treatment Options and is a complaint that carries with it a high risk of complications and morbidity. The differential includes musculoskeletal pain, sciatica, compression fracture, AAA, UTI, renal colic  I ordered, reviewed and interpreted labs, which included CBC with normal white count, hemoglobin low stable from priors, chemistries low bicarb stable from priors, elevated creatinine IV fluids given, urinalysis possible signs of infection sent for culture I ordered medication IV pain medication and IV fluids, potassium, home medications and reviewed PMP when indicated. I ordered imaging studies which included CT renal and CT lumbar spine and I independently    visualized and interpreted imaging which showed no acute fractures.  Does have bilateral pleural effusions question infiltrate Additional history obtained from EMS and patient's wife Previous records obtained and reviewed in epic including prior orthopedic visits I consulted physical therapy and TOC and discussed lab and imaging findings and discussed disposition.  Cardiac monitoring reviewed, normal sinus rhythm Social determinants considered, no significant barriers Critical Interventions: None  After the interventions stated above, I reevaluated the patient and found patient's pain to be improved but unable to transfer independently. Admission and further testing considered, patient will board in the ED for physical therapy and social work consult in the a.m.  Home medications have been ordered.  Patient agreeable to plan for reevaluation in morning.          Final Clinical Impression(s) / ED Diagnoses Final diagnoses:  Acute bilateral low back pain with bilateral sciatica  Pleural effusion    Rx / DC Orders ED Discharge Orders     None         Terrilee Files, MD 03/15/21 [phone]

## 2021-03-14 NOTE — Discharge Instructions (Addendum)
[name] [name] was seen in the ER for back pain and had a medical work-up including CT scan.  We did not see any obvious emergencies or surgical issues from his work-up, but there are signs of a possible urine infection.  He was started on Bactrim and will take 5 more days of this.  Please follow up on his urine culture results.   ? ?He may be experiencing sciatica from his degenerative lumbar disc disease. ? ?His CT scan also showed moderate effusions in the lungs and atelectasis (lower lungs).  He was not requiring oxygen, and clinically did not show other signs of pneumonia.  This was felt to be a chronic finding. ?

## 2021-03-14 NOTE — ED Triage Notes (Signed)
Per EMS- patient has had a back fracture since 01/2021 And recently had more fractures since then. ?Patient to his physician today for a check up and they increased his Hydrocodone dosage. Patient went home and laid down for several hours. Patient states he was awakened with excruciating back pain and unable to move. ?

## 2021-03-15 LAB — URINALYSIS, ROUTINE W REFLEX MICROSCOPIC
Bilirubin Urine: NEGATIVE
Glucose, UA: NEGATIVE mg/dL
Ketones, ur: NEGATIVE mg/dL
Nitrite: NEGATIVE
Protein, ur: NEGATIVE mg/dL
Specific Gravity, Urine: 1.014 (ref 1.005–1.030)
pH: 6 (ref 5.0–8.0)

## 2021-03-15 MED ORDER — FINASTERIDE 5 MG PO TABS
5.0000 mg | ORAL_TABLET | Freq: Every day | ORAL | Status: DC
  Administered 2021-03-15 – 2021-03-16 (×2): 5 mg via ORAL
  Filled 2021-03-15 (×2): qty 1

## 2021-03-15 MED ORDER — GABAPENTIN 100 MG PO CAPS
100.0000 mg | ORAL_CAPSULE | Freq: Four times a day (QID) | ORAL | Status: DC
  Administered 2021-03-15 – 2021-03-16 (×5): 100 mg via ORAL
  Filled 2021-03-15 (×5): qty 1

## 2021-03-15 MED ORDER — HYDROCODONE-ACETAMINOPHEN 5-325 MG PO TABS
1.0000 | ORAL_TABLET | ORAL | Status: DC | PRN
  Administered 2021-03-15: 2 via ORAL
  Administered 2021-03-15 (×2): 1 via ORAL
  Administered 2021-03-15: 2 via ORAL
  Filled 2021-03-15: qty 2
  Filled 2021-03-15: qty 1
  Filled 2021-03-15 (×2): qty 2

## 2021-03-15 MED ORDER — HYDRALAZINE HCL 25 MG PO TABS
25.0000 mg | ORAL_TABLET | Freq: Two times a day (BID) | ORAL | Status: DC
Start: 2021-03-15 — End: 2021-03-16
  Administered 2021-03-15 – 2021-03-16 (×4): 25 mg via ORAL
  Filled 2021-03-15 (×4): qty 1

## 2021-03-15 MED ORDER — DOCUSATE SODIUM 100 MG PO CAPS
100.0000 mg | ORAL_CAPSULE | Freq: Two times a day (BID) | ORAL | Status: DC
  Administered 2021-03-15 – 2021-03-16 (×3): 100 mg via ORAL
  Filled 2021-03-15 (×3): qty 1

## 2021-03-15 MED ORDER — MEMANTINE HCL 5 MG PO TABS
10.0000 mg | ORAL_TABLET | Freq: Two times a day (BID) | ORAL | Status: DC
  Administered 2021-03-15 – 2021-03-16 (×4): 10 mg via ORAL
  Filled 2021-03-15 (×4): qty 2

## 2021-03-15 MED ORDER — ISOSORBIDE MONONITRATE ER 60 MG PO TB24
60.0000 mg | ORAL_TABLET | Freq: Every day | ORAL | Status: DC
  Administered 2021-03-16: 60 mg via ORAL
  Filled 2021-03-15 (×2): qty 1

## 2021-03-15 MED ORDER — POTASSIUM CHLORIDE CRYS ER 10 MEQ PO TBCR
10.0000 meq | EXTENDED_RELEASE_TABLET | Freq: Once | ORAL | Status: AC
  Administered 2021-03-15: 10 meq via ORAL
  Filled 2021-03-15: qty 1

## 2021-03-15 MED ORDER — ASPIRIN EC 81 MG PO TBEC
81.0000 mg | DELAYED_RELEASE_TABLET | Freq: Every day | ORAL | Status: DC
  Administered 2021-03-15 – 2021-03-16 (×2): 81 mg via ORAL
  Filled 2021-03-15 (×2): qty 1

## 2021-03-15 MED ORDER — PANTOPRAZOLE SODIUM 40 MG PO TBEC
40.0000 mg | DELAYED_RELEASE_TABLET | ORAL | Status: DC
  Administered 2021-03-15: 40 mg via ORAL
  Filled 2021-03-15: qty 1

## 2021-03-15 MED ORDER — EZETIMIBE 10 MG PO TABS
10.0000 mg | ORAL_TABLET | Freq: Every day | ORAL | Status: DC
  Administered 2021-03-15 – 2021-03-16 (×2): 10 mg via ORAL
  Filled 2021-03-15 (×2): qty 1

## 2021-03-15 MED ORDER — GALANTAMINE HYDROBROMIDE ER 8 MG PO CP24
8.0000 mg | ORAL_CAPSULE | Freq: Every day | ORAL | Status: DC
  Administered 2021-03-16: 8 mg via ORAL
  Filled 2021-03-15: qty 1

## 2021-03-15 MED ORDER — AMIODARONE HCL 200 MG PO TABS
200.0000 mg | ORAL_TABLET | Freq: Every day | ORAL | Status: DC
Start: 2021-03-15 — End: 2021-03-16
  Administered 2021-03-15 – 2021-03-16 (×2): 200 mg via ORAL
  Filled 2021-03-15 (×2): qty 1

## 2021-03-15 MED ORDER — TORSEMIDE 20 MG PO TABS
40.0000 mg | ORAL_TABLET | Freq: Every day | ORAL | Status: DC
  Administered 2021-03-15 – 2021-03-16 (×2): 40 mg via ORAL
  Filled 2021-03-15 (×3): qty 2

## 2021-03-15 MED ORDER — METOPROLOL SUCCINATE ER 25 MG PO TB24
12.5000 mg | ORAL_TABLET | Freq: Every day | ORAL | Status: DC | PRN
Start: 2021-03-15 — End: 2021-03-16
  Filled 2021-03-15: qty 0.5

## 2021-03-15 MED ORDER — AMLODIPINE BESYLATE 5 MG PO TABS
5.0000 mg | ORAL_TABLET | Freq: Every day | ORAL | Status: DC
  Filled 2021-03-15: qty 1

## 2021-03-15 MED ORDER — RIVAROXABAN 15 MG PO TABS
15.0000 mg | ORAL_TABLET | Freq: Every day | ORAL | Status: DC
  Administered 2021-03-15: 15 mg via ORAL
  Filled 2021-03-15: qty 1

## 2021-03-15 MED ORDER — ATORVASTATIN CALCIUM 40 MG PO TABS
40.0000 mg | ORAL_TABLET | Freq: Every day | ORAL | Status: DC
  Administered 2021-03-15 – 2021-03-16 (×2): 40 mg via ORAL
  Filled 2021-03-15 (×2): qty 1

## 2021-03-15 NOTE — [place] FL2 (Signed)
?[place] MEDICAID FL2 LEVEL OF CARE SCREENING TOOL  ?  ? ?IDENTIFICATION  ?Patient Name: ?[name] [name] Birthdate: [birth year] Sex: male Admission Date (Current Location): ?03/14/2021  ?[place] and [place] Number: ? [place] ?  Facility and Address:  ?[facility],  [address] [place], [place] ?     Provider Number: ?[id number]  ?Attending Physician Name and Address:  ?Default, Provider, MD ? Relative Name and Phone Number:  ?Rogue Jury (Spouse) [phone] ?   ?Current Level of Care: ?Hospital Recommended Level of Care: ?[place] Prior Approval Number: ?  ? ?Date Approved/Denied: ?  PASRR Number: ?[id number] A ? ?Discharge Plan: ?SNF ?  ? ?Current Diagnoses: ?Patient Active Problem List  ? Diagnosis Date Noted  ? Gouty arthritis 02/13/2021  ? Morbid obesity ([place]) 02/13/2021  ? Abdominal aortic aneurysm without rupture 11/22/2020  ? Acute renal failure syndrome ([place]) 11/22/2020  ? Low back pain 11/22/2020  ? Skin tear of right upper arm without complication 63/01/6008  ? Pressure injury of skin 10/23/2020  ? Urinary tract infection with hematuria 10/22/2020  ? Encephalopathy 10/22/2020  ? AKI (acute kidney injury) ([place])   ? Overweight (BMI 25.0-29.9) 08/19/2020  ? Vertebral fracture, osteoporotic ([place]) 08/18/2020  ? T8 vertebral fracture ([place]) 08/17/2020  ? Thrombocytopenia ([place]) 08/17/2020  ? CKD (chronic kidney disease) stage 3, GFR 30-59 ml/min (HCC) 08/17/2020  ? Elevated troponin 08/17/2020  ? Pain due to onychomycosis of toenails of both feet 07/27/2020  ? Callus of heel 07/27/2020  ? Bifascicular block 11/11/2019  ? Acute on chronic diastolic heart failure ([place]) 11/10/2019  ? 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  ? Severe aortic stenosis 09/29/2019  ? Aortic valve disorder 03/11/2019  ? Systolic heart failure ([place]) 03/11/2019  ? Thrombophilia ([place]) 02/26/2019  ? Muscle pain 02/11/2019  ? Weakness 02/06/2019  ? Polymyalgia rheumatica ([place]) 02/06/2019   ? Shoulder joint pain 02/02/2019  ? Anemia 01/26/2019  ? Malignant melanoma of skin ([place]) 01/26/2019  ? Pain in right leg 01/26/2019  ? DDD (degenerative disc disease), cervical 05/28/2018  ? Encounter for general adult medical examination without abnormal findings 11/19/2017  ? Carotid artery occlusion 07/03/2017  ? Hearing loss 07/03/2017  ? Fall 06/19/2017  ? Injury of upper extremity 06/19/2017  ? Neck pain 06/19/2017  ? Headache 06/19/2017  ? Visual disturbance 06/19/2017  ? Ganglion of hand 05/08/2017  ? Dilatation of aorta ([place]) 02/14/2017  ? Plantar fascial fibromatosis 02/14/2017  ? Cardiac murmur, unspecified 02/27/2016  ? Benign neoplasm of colon 08/24/2015  ? Dementia ([place]) 08/24/2015  ? Influenza due to unidentified influenza virus with other respiratory manifestations 03/15/2015  ? Epistaxis 12/01/2014  ? OSA (obstructive sleep apnea) 11/18/2014  ? Gastro-esophageal reflux disease with esophagitis 10/29/2014  ? Angina pectoris ([place]) 05/04/2014  ? Abnormal feces 07/31/2013  ? Other specified disorders of gingiva and edentulous alveolar ridge 07/31/2013  ? Spontaneous ecchymosis 10/10/2012  ? Thoracic ascending aortic aneurysm 09/29/2012  ? Microscopic hematuria 04/11/2012  ? Proteinuria 04/11/2012  ? S/P laparoscopic cholecystectomy 04/09/2012  ? S/P CABG x 3 03/06/2012  ? S/P Maze operation for atrial fibrillation 03/06/2012  ? Paroxysmal atrial fibrillation ([place]) 02/29/2012  ? Bitten or stung by nonvenomous insect and other nonvenomous arthropods, initial encounter 12/03/2011  ? Depression screening 10/17/2011  ? CAD (coronary artery disease), native coronary artery   ? Hyperlipidemia   ? Hypertension   ? Chronic venous insufficiency   ? Gout   ? Benign prostatic hyperplasia   ?  Post-traumatic stress disorder 03/13/2011  ? Pain in left leg 12/08/2010  ? 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  ? Fatigue 08/08/2010  ? Retention of urine 04/18/2010  ? Constipation 07/05/2009  ? Kidney stone 07/05/2009  ? ED  (erectile dysfunction) of organic origin 12/14/2008  ? Impaired fasting glucose 12/14/2008  ? Vitamin B deficiency 12/14/2008  ? ? ?Orientation RESPIRATION BLADDER Height & Weight   ?  ?Self, Time, Situation, Place ? Normal Continent Weight: 210 lb (95.3 kg) ?Height:  6' (182.9 cm)  ?BEHAVIORAL SYMPTOMS/MOOD NEUROLOGICAL BOWEL NUTRITION STATUS  ?    Continent Diet (Heart Healthy)  ?AMBULATORY STATUS COMMUNICATION OF NEEDS Skin   ?Extensive Assist Verbally Other (Comment) (Stage three full thickness no bone or tendon visible on both  ankles and heels) ?  ?  ?  ?    ?     ?     ? ? ?Personal Care Assistance Level of Assistance  ?Bathing, Feeding, Dressing Bathing Assistance: Maximum assistance ?Feeding assistance: Independent ?Dressing Assistance: Maximum assistance ?   ? ?Functional Limitations Info  ?Sight, Hearing, Speech Sight Info: Adequate ?Hearing Info: Adequate ?Speech Info: Adequate  ? ? ?SPECIAL CARE FACTORS FREQUENCY  ?PT (By licensed PT), OT (By licensed OT)   ?  ?PT Frequency: 5 times weekly ?OT Frequency: 5 times weekly ?  ?  ?  ?   ? ? ?Contractures Contractures Info: Not present  ? ? ?Additional Factors Info  ?Code Status Code Status Info: DNR ?  ?  ?  ?  ?   ? ?Current Medications (03/15/2021):  This is the current hospital active medication list ?Current Facility-Administered Medications  ?Medication Dose Route Frequency Provider Last Rate Last Admin  ? amiodarone (PACERONE) tablet 200 mg  200 mg Oral Daily Hayden Rasmussen, MD   200 mg at 03/15/21 1013  ? aspirin EC tablet 81 mg  81 mg Oral Daily Hayden Rasmussen, MD   81 mg at 03/15/21 1010  ? atorvastatin (LIPITOR) tablet 40 mg  40 mg Oral Daily Hayden Rasmussen, MD   40 mg at 03/15/21 1011  ? docusate sodium (COLACE) capsule 100 mg  100 mg Oral BID Lacretia Leigh, MD   100 mg at 03/15/21 1010  ? ezetimibe (ZETIA) tablet 10 mg  10 mg Oral Daily Hayden Rasmussen, MD   10 mg at 03/15/21 1012  ? finasteride (PROSCAR) tablet 5 mg  5 mg Oral Daily  Hayden Rasmussen, MD   5 mg at 03/15/21 1013  ? gabapentin (NEURONTIN) capsule 100 mg  100 mg Oral QID Hayden Rasmussen, MD   100 mg at 03/15/21 1011  ? [START ON 03/16/2021] galantamine (RAZADYNE ER) 24 hr capsule 8 mg  8 mg Oral Q breakfast Lacretia Leigh, MD      ? hydrALAZINE (APRESOLINE) tablet 25 mg  25 mg Oral BID Hayden Rasmussen, MD   25 mg at 03/15/21 1011  ? HYDROcodone-acetaminophen (NORCO/VICODIN) 5-325 MG per tablet 1-2 tablet  1-2 tablet Oral Q4H PRN Hayden Rasmussen, MD   2 tablet at 03/15/21 0746  ? isosorbide mononitrate (IMDUR) 24 hr tablet 60 mg  60 mg Oral Daily Hayden Rasmussen, MD      ? lidocaine (LIDODERM) 5 % 1 patch  1 patch Transdermal Q24H Hayden Rasmussen, MD      ? memantine [facility]) tablet 10 mg  10 mg Oral BID Hayden Rasmussen, MD   10 mg at 03/15/21 1010  ? metoprolol succinate (  TOPROL-XL) 24 hr tablet 12.5 mg  12.5 mg Oral Daily PRN Hayden Rasmussen, MD      ? pantoprazole (PROTONIX) EC tablet 40 mg  40 mg Oral Farrel Conners, MD   40 mg at 03/15/21 1010  ? Rivaroxaban (XARELTO) tablet 15 mg  15 mg Oral Q supper Hayden Rasmussen, MD      ? torsemide [facility]) tablet 40 mg  40 mg Oral Daily Hayden Rasmussen, MD      ? ?Current Outpatient Medications  ?Medication Sig Dispense Refill  ? acetaminophen (TYLENOL) 500 MG tablet Take 2 tablets (1,000 mg total) by mouth 3 (three) times daily. For 3 to 5 days and then change to 1000 mg 3 times daily as needed for pain. (Patient taking differently: Take 500 mg by mouth as needed for mild pain.)    ? amiodarone (PACERONE) 200 MG tablet Take 1 tablet (200 mg total) by mouth daily. 90 tablet 3  ? aspirin EC 81 MG EC tablet Take 1 tablet (81 mg total) by mouth daily. Swallow whole. 30 tablet 11  ? atorvastatin (LIPITOR) 40 MG tablet Take 1 tablet (40 mg total) by mouth daily. 90 tablet 1  ? cadexomer iodine (IODOSORB) 0.9 % gel Apply 1 application. topically every other day as needed for wound care.    ? cholecalciferol  (VITAMIN D3) 25 MCG (1000 UNIT) tablet Take 1,000 Units by mouth daily.     ? Cyanocobalamin (VITAMIN B-12) 5000 MCG TBDP Take 5,000 mcg by mouth daily.    ? denosumab (PROLIA) 60 MG/ML SOSY injection Inject

## 2021-03-15 NOTE — ED Notes (Signed)
Pt's sats 89% on RA while asleep, placed on 2LNC. States hx of sleep apnea, usually wears CPAP at night ?

## 2021-03-15 NOTE — ED Provider Notes (Signed)
Emergency Medicine Observation Re-evaluation Note ? ?[name] [name] is a 88 y.o. male, seen on rounds today.  Pt initially presented to the ED for complaints of Back Pain ?Currently, the patient is resting comfortable in bed. ? ?Physical Exam  ?BP (!) 175/70   Pulse 62   Temp 97.6 ?F (36.4 ?C) (Oral)   Resp 15   Ht 1.829 m (6')   Wt 95.3 kg   SpO2 91%   BMI 28.48 kg/m?  ?Physical Exam ?General: Well-appearing ? ?ED Course / MDM  ?EKG:  ? ?I have reviewed the labs performed to date as well as medications administered while in observation.  Recent changes in the last 24 hours include none. ? ?Plan  ?Current plan is for placement to a SNF. ? [name] is not under involuntary commitment. ? ? ?  ?Lacretia Leigh, MD ?03/15/21 1108 ? ?

## 2021-03-15 NOTE — ED Notes (Addendum)
Pts insurance [name] [name] has been approved. CSW updated [place] with [facility] that [name] [name] is approved. The facility can accept pt tomorrow. TOC to follow.  ? ?Authorization information is as follows approved 3/15 - 3/17, next review 3/17, navi auth id # [id number]. ?

## 2021-03-15 NOTE — ED Notes (Addendum)
TOC consulted for HH/DME/SNF needs. CSW reached out to pts wife to gather more information. CSW spoke with pts wife [name] and daughter Sharyn Lull via phone. CSW inquired about SNF if PT recommends. [name] states that pt will need to go to SNF if he is unable to ambulate around the home. Pt has been to [facility] for SNF in the past and they are requesting this facility again. CSW left VM for admissions at [facility] requesting a return call.  ? ?Pt has been active with [facility] services in the past. Pt has an in home aide through [facility] 3 times a week from 8-11. Pt also has a long term care insurance plan through [employer].  ? ?CSW explained that TOC will have to await PT recommendations before SNF referral can be sent out. TOC to follow.  ? ?Addendum 12:30pm: CSW spoke to admissions at [facility] who states they do not have beds at this time. CSW also spoke to admissions at [facility] and they do not have beds at this time.  ? ?Pt has gotten a bed offer from [facility], CSW spoke with pts wife [name] about this and she is agreeable to accepting this bed. Insurance Josem Kaufmann has been started at this time. CSW spoke to [place] at [facility] who states they will be ready to accept pt when Josem Kaufmann is approved. TOC to follow.  ?

## 2021-03-15 NOTE — Evaluation (Signed)
Physical Therapy Evaluation ?Patient Details ?Name: [name] [name] ?MRN: [id number] ?DOB: [birth year] ?Today's Date: 03/15/2021 ? ?History of Present Illness ? [name] [name] is a 88 y.o. male.  He has a history of cardiac disease TAVR AAA, peripheral vascular disease,CABG,,compression fractures, kyphoplasty, chronic foot wounds from PVD. Marland KitchenPatient had progressive   increased back pain and unable to get up from chair, brought to ED 03/14/21.  CT - Bilateral pleural effusions  ?Clinical Impression ? The patient  was able to  sit up onto edge of stretcher with mod assistance. Stood x 2 at Rw, unable to take any steps ,  C/O increased back pain. Patient had pain medication ~ 1 hour prior to PT. Patient  is ambulatory in his home with rollator, with  noted decline in distance recently, does have a WC, now states that he may need a scooter. Patient  states that his wife is unable to provide  physical assistance. Patient has had  falls at home. ?  Continue PT for  ambulation/mobility.   Pt admitted with above diagnosis.  Pt currently with functional limitations due to the deficits listed below (see PT Problem List). Pt will benefit from skilled PT to increase their independence and safety with mobility to allow discharge to the venue listed below.   ? ?   ? ?Recommendations for follow up therapy are one component of a multi-disciplinary discharge planning process, led by the attending physician.  Recommendations may be updated based on patient status, additional functional criteria and insurance authorization. ? ?Follow Up Recommendations Skilled nursing-short term rehab (<3 hours/day) ? ?  ?Assistance Recommended at Discharge Frequent or constant Supervision/Assistance  ?Patient can return home with the following ? Two people to help with walking and/or transfers;A lot of help with bathing/dressing/bathroom;Assistance with cooking/housework;Assist for transportation;Help with stairs or ramp for entrance ? ?  ?Equipment  Recommendations None recommended by PT  ?Recommendations for Other Services ? OT consult  ?  ?Functional Status Assessment Patient has had a recent decline in their functional status and demonstrates the ability to make significant improvements in function in a reasonable and predictable amount of time.  ? ?  ?Precautions / Restrictions Precautions ?Precautions: Fall ?Precaution Comments: wounds on feet  ? ?  ? ?Mobility ? Bed Mobility ?Overal bed mobility: Needs Assistance ?Bed Mobility: Supine to Sit, Sit to Supine ?  ?  ?Supine to sit: Mod assist, HOB elevated ?Sit to supine: Max assist ?  ?General bed mobility comments: raised  HOB , patient able to use rail to support and move legs to bed edge, mod assist with trunk to sit upright, mod assoist to retrun legs baxck onto stretcher. ?  ? ?Transfers ?Overall transfer level: Needs assistance ?Equipment used: Rolling walker (2 wheels) ?Transfers: Sit to/from Stand ?Sit to Stand: Max assist, From elevated surface ?  ?  ?  ?  ?  ?General transfer comment: max support to rise  from raised stretcher x 2, unable to take a step, trunk flexed forward, patient  endirses increased lower back pain. ?  ? ?Ambulation/Gait ?  ?  ?  ?  ?  ?  ?  ?General Gait Details: unable ? ?Stairs ?  ?  ?  ?  ?  ? ?Wheelchair Mobility ?  ? ?Modified Rankin (Stroke Patients Only) ?  ? ?  ? ?Balance Overall balance assessment: Needs assistance ?Sitting-balance support: Feet supported, Bilateral upper extremity supported ?Sitting balance-Leahy Scale: Poor ?Sitting balance - Comments: reliant on UE's for  balance ?  ?Standing balance support: During functional activity, Bilateral upper extremity supported, Reliant on assistive device for balance ?Standing balance-Leahy Scale: Poor ?  ?  ?  ?  ?  ?  ?  ?  ?  ?  ?  ?  ?   ? ? ? ?Pertinent Vitals/Pain Pain Assessment ?Pain Assessment: Faces ?Pain Score: 6  ?Pain Location: increased  to 8/10 ?Pain Descriptors / Indicators: Aching, Discomfort, Moaning,  Grimacing ?Pain Intervention(s): Limited activity within patient's tolerance, Premedicated before session, Repositioned  ? ? ?Home Living Family/patient expects to be discharged to:: Private residence ?Living Arrangements: Spouse/significant other ?Available Help at Discharge: Family;Available 24 hours/day;Personal care attendant ?Type of Home: House ?Home Access: Elevator ?  ?  ?  ?Home Layout: Able to live on main level with bedroom/bathroom ?Home Equipment: Rollator (4 wheels);Rolling Walker (2 wheels);Wheelchair - manual;Grab bars - tub/shower;Shower seat ?   ?  ?Prior Function   ?  ?  ?  ?  ?  ?  ?Mobility Comments: generally ambulatory with rollator, recently more difficult, has had HHPT 2 x week ?ADLs Comments: ahas caring  hands Assistnace several hours a few trimes a week. ?  ? ? ?Hand Dominance  ? Dominant Hand: Right ? ?  ?Extremity/Trunk Assessment  ? Upper Extremity Assessment ?Upper Extremity Assessment: Overall WFL for tasks assessed ?  ? ?Lower Extremity Assessment ?Lower Extremity Assessment: RLE deficits/detail;LLE deficits/detail ?RLE Deficits / Details: dressings on feet, grossly 4+/5 to stand- ?LLE Deficits / Details: same as right ?  ? ?Cervical / Trunk Assessment ?Cervical / Trunk Assessment: Other exceptions;Back Surgery ?Cervical / Trunk Exceptions: uses UE's maximally to support in sitting, when release support, increased pain reported and trunk flexes forward.  ?Communication  ? Communication: No difficulties  ?Cognition Arousal/Alertness: Awake/alert ?Behavior During Therapy: [facility] for tasks assessed/performed ?Overall Cognitive Status: Within Functional Limits for tasks assessed ?  ?  ?  ?  ?  ?  ?  ?  ?  ?  ?  ?  ?  ?  ?  ?  ?  ?  ?  ? ?  ?General Comments   ? ?  ?Exercises    ? ?Assessment/Plan  ?  ?PT Assessment Patient needs continued PT services  ?PT Problem List Decreased strength;Decreased mobility;Decreased safety awareness;Decreased range of motion;Decreased knowledge of  precautions;Decreased activity tolerance;Cardiopulmonary status limiting activity;Decreased balance;Pain;Decreased knowledge of use of DME;Decreased skin integrity ? ?   ?  ?PT Treatment Interventions DME instruction;Therapeutic activities;Gait training;Therapeutic exercise;Patient/family education;Functional mobility training   ? ?PT Goals (Current goals can be found in the Care Plan section)  ?Acute Rehab PT Goals ?Patient Stated Goal: to walk again, no back pain ?PT Goal Formulation: With patient ?Time For Goal Achievement: 03/29/21 ?Potential to Achieve Goals: Fair ? ?  ?Frequency Min 2X/week ?  ? ? ?Co-evaluation   ?  ?  ?  ?  ? ? ?  ?AM-PAC PT "6 Clicks" Mobility  ?Outcome Measure Help needed turning from your back to your side while in a flat bed without using bedrails?: A Lot ?Help needed moving from lying on your back to sitting on the side of a flat bed without using bedrails?: A Lot ?Help needed moving to and from a bed to a chair (including a wheelchair)?: Total ?Help needed standing up from a chair using your arms (e.g., wheelchair or bedside chair)?: Total ?Help needed to walk in hospital room?: Total ?Help needed climbing 3-5 steps with a railing? :  Total ?6 Click Score: 8 ? ?  ?End of Session Equipment Utilized During Treatment: Gait belt ?Activity Tolerance: Patient limited by pain ?Patient left: in bed;with call bell/phone within reach ?Nurse Communication: Mobility status ?PT Visit Diagnosis: Unsteadiness on feet (R26.81);Pain;Difficulty in walking, not elsewhere classified (R26.2) ?  ? ?Time: 4847-2072 ?PT Time Calculation (min) (ACUTE ONLY): 47 min ? ? ?Charges:   PT Evaluation ?$PT Eval Low Complexity: 1 Low ?PT Treatments ?$Therapeutic Activity: 23-37 mins ?  ?   ? ? ?Tresa Endo PT ?Acute Rehabilitation Services ?Pager [phone] ?Office [phone] ? ? ?Jeanne Diefendorf, Shella Maxim ?03/15/2021, 10:14 AM ?

## 2021-03-16 DIAGNOSIS — L97322 Non-pressure chronic ulcer of left ankle with fat layer exposed: Secondary | ICD-10-CM | POA: Diagnosis not present

## 2021-03-16 DIAGNOSIS — N183 Chronic kidney disease, stage 3 unspecified: Secondary | ICD-10-CM | POA: Diagnosis not present

## 2021-03-16 DIAGNOSIS — I480 Paroxysmal atrial fibrillation: Secondary | ICD-10-CM | POA: Diagnosis not present

## 2021-03-16 DIAGNOSIS — I5021 Acute systolic (congestive) heart failure: Secondary | ICD-10-CM | POA: Diagnosis not present

## 2021-03-16 DIAGNOSIS — I259 Chronic ischemic heart disease, unspecified: Secondary | ICD-10-CM | POA: Diagnosis not present

## 2021-03-16 DIAGNOSIS — R2681 Unsteadiness on feet: Secondary | ICD-10-CM | POA: Diagnosis not present

## 2021-03-16 DIAGNOSIS — B9689 Other specified bacterial agents as the cause of diseases classified elsewhere: Secondary | ICD-10-CM | POA: Diagnosis not present

## 2021-03-16 DIAGNOSIS — L89613 Pressure ulcer of right heel, stage 3: Secondary | ICD-10-CM | POA: Diagnosis not present

## 2021-03-16 DIAGNOSIS — R41841 Cognitive communication deficit: Secondary | ICD-10-CM | POA: Diagnosis not present

## 2021-03-16 DIAGNOSIS — I252 Old myocardial infarction: Secondary | ICD-10-CM | POA: Diagnosis not present

## 2021-03-16 DIAGNOSIS — I2510 Atherosclerotic heart disease of native coronary artery without angina pectoris: Secondary | ICD-10-CM | POA: Diagnosis not present

## 2021-03-16 DIAGNOSIS — I4891 Unspecified atrial fibrillation: Secondary | ICD-10-CM | POA: Diagnosis not present

## 2021-03-16 DIAGNOSIS — G4733 Obstructive sleep apnea (adult) (pediatric): Secondary | ICD-10-CM | POA: Diagnosis not present

## 2021-03-16 DIAGNOSIS — I7389 Other specified peripheral vascular diseases: Secondary | ICD-10-CM | POA: Diagnosis not present

## 2021-03-16 DIAGNOSIS — R4182 Altered mental status, unspecified: Secondary | ICD-10-CM | POA: Diagnosis not present

## 2021-03-16 DIAGNOSIS — N401 Enlarged prostate with lower urinary tract symptoms: Secondary | ICD-10-CM | POA: Diagnosis not present

## 2021-03-16 DIAGNOSIS — L98492 Non-pressure chronic ulcer of skin of other sites with fat layer exposed: Secondary | ICD-10-CM | POA: Diagnosis not present

## 2021-03-16 DIAGNOSIS — I10 Essential (primary) hypertension: Secondary | ICD-10-CM | POA: Diagnosis not present

## 2021-03-16 DIAGNOSIS — I872 Venous insufficiency (chronic) (peripheral): Secondary | ICD-10-CM | POA: Diagnosis not present

## 2021-03-16 DIAGNOSIS — J90 Pleural effusion, not elsewhere classified: Secondary | ICD-10-CM | POA: Diagnosis not present

## 2021-03-16 DIAGNOSIS — R339 Retention of urine, unspecified: Secondary | ICD-10-CM | POA: Diagnosis not present

## 2021-03-16 DIAGNOSIS — E669 Obesity, unspecified: Secondary | ICD-10-CM | POA: Diagnosis not present

## 2021-03-16 DIAGNOSIS — M6281 Muscle weakness (generalized): Secondary | ICD-10-CM | POA: Diagnosis not present

## 2021-03-16 DIAGNOSIS — L97822 Non-pressure chronic ulcer of other part of left lower leg with fat layer exposed: Secondary | ICD-10-CM | POA: Diagnosis not present

## 2021-03-16 DIAGNOSIS — I129 Hypertensive chronic kidney disease with stage 1 through stage 4 chronic kidney disease, or unspecified chronic kidney disease: Secondary | ICD-10-CM | POA: Diagnosis not present

## 2021-03-16 DIAGNOSIS — M5442 Lumbago with sciatica, left side: Secondary | ICD-10-CM | POA: Diagnosis not present

## 2021-03-16 DIAGNOSIS — M5441 Lumbago with sciatica, right side: Secondary | ICD-10-CM | POA: Diagnosis not present

## 2021-03-16 DIAGNOSIS — N3000 Acute cystitis without hematuria: Secondary | ICD-10-CM | POA: Diagnosis not present

## 2021-03-16 DIAGNOSIS — L97312 Non-pressure chronic ulcer of right ankle with fat layer exposed: Secondary | ICD-10-CM | POA: Diagnosis not present

## 2021-03-16 DIAGNOSIS — I739 Peripheral vascular disease, unspecified: Secondary | ICD-10-CM | POA: Diagnosis not present

## 2021-03-16 DIAGNOSIS — R2689 Other abnormalities of gait and mobility: Secondary | ICD-10-CM | POA: Diagnosis not present

## 2021-03-16 DIAGNOSIS — Z9181 History of falling: Secondary | ICD-10-CM | POA: Diagnosis not present

## 2021-03-16 DIAGNOSIS — I5032 Chronic diastolic (congestive) heart failure: Secondary | ICD-10-CM | POA: Diagnosis not present

## 2021-03-16 DIAGNOSIS — M545 Low back pain, unspecified: Secondary | ICD-10-CM | POA: Diagnosis not present

## 2021-03-16 MED ORDER — POLYETHYLENE GLYCOL 3350 17 G PO PACK
17.0000 g | PACK | Freq: Every day | ORAL | Status: DC
  Administered 2021-03-16: 17 g via ORAL
  Filled 2021-03-16: qty 1

## 2021-03-16 MED ORDER — HYDROCODONE-ACETAMINOPHEN 5-325 MG PO TABS: 1.0000 | ORAL_TABLET | Freq: Three times a day (TID) | ORAL | 0 refills | Status: DC | PRN

## 2021-03-16 MED ORDER — SULFAMETHOXAZOLE-TRIMETHOPRIM 800-160 MG PO TABS
1.0000 | ORAL_TABLET | Freq: Two times a day (BID) | ORAL | Status: DC
Start: 2021-03-16 — End: 2021-03-16
  Administered 2021-03-16: 1 via ORAL
  Filled 2021-03-16: qty 1

## 2021-03-16 NOTE — ED Provider Notes (Signed)
Pt here pending SNF placement ?Not under IVC ?Resting comfortably, vitals wnl this morning ? ?UA suggestive of UTI , and CT study showing some density of the urinary tract system.  Per prior urine cx results (positive last year), patient has had UTI's, susceptible to bactrim; we'll start on bactrim now pending Urine cx results. ?  ?Wyvonnia Dusky, MD ?03/16/21 [phone] ? ?

## 2021-03-16 NOTE — Progress Notes (Signed)
CSW spoke with Lexine Baton from [facility] , pt can discharge today to facility.  ? ?Pt room assignment 505 call report [phone]. ? ?Arlie Solomons.Sahmir Weatherbee, MSW, [place] ?[place]  Transitions of Care ?Clinical Social Worker I ?Direct Dial: [phone]  Fax: [phone] ?[email or web address]  ?

## 2021-03-17 LAB — URINE CULTURE: Culture: >=100000 — AB

## 2021-03-18 ENCOUNTER — Telehealth (HOSPITAL_BASED_OUTPATIENT_CLINIC_OR_DEPARTMENT_OTHER): Payer: Self-pay | Admitting: Emergency Medicine

## 2021-03-18 NOTE — Telephone Encounter (Signed)
Post ED Visit - Positive Culture Follow-up: Successful Patient Follow-Up ? ?Culture assessed and recommendations reviewed by: ? ?'[]'$  Elenor Quinones, Pharm.D. ?'[]'$  Heide Guile, Pharm.D., BCPS AQ-ID ?'[]'$  Parks Neptune, Pharm.D., BCPS ?'[]'$  Alycia Rossetti, Pharm.D., BCPS ?'[]'$  Walker, Pharm.D., BCPS, AAHIVP ?'[]'$  Legrand Como, Pharm.D., BCPS, AAHIVP ?'[]'$  Salome Arnt, PharmD, BCPS ?'[]'$  Johnnette Gourd, PharmD, BCPS ?'[]'$  Hughes Better, PharmD, BCPS ?'[x]'$  Arlyn Dunning, PharmD ? ?Positive urine culture ? ?'[x]'$  Patient discharged without antimicrobial prescription and treatment is now indicated ?'[]'$  Organism is resistant to prescribed ED discharge antimicrobial ?'[]'$  Patient with positive blood cultures ? ?Changes discussed with ED provider: Garfield Cornea, MD ?New antibiotic prescription Bactrim DS one tab by mouth twice daily for five days ?Called/faxed to [facility] ?Phone:  [phone] ?Fax:  [phone] ? ?Contacted patient's nurse Frankey Poot @ [facility] date 03/18/21, time 1600 ? ? ?Sandi Raveling Saben Donigan ?03/18/2021, 5:52 PM ? ?  ?

## 2021-03-19 IMAGING — CR DG CHEST 2V
2 series · 2 of 2 positions shown · non-contrast
Comparison: 09/11/2018

CLINICAL DATA: Pre trans catheter aortic valve replacement.

EXAM:
CHEST - 2 VIEW

[w chest pa]
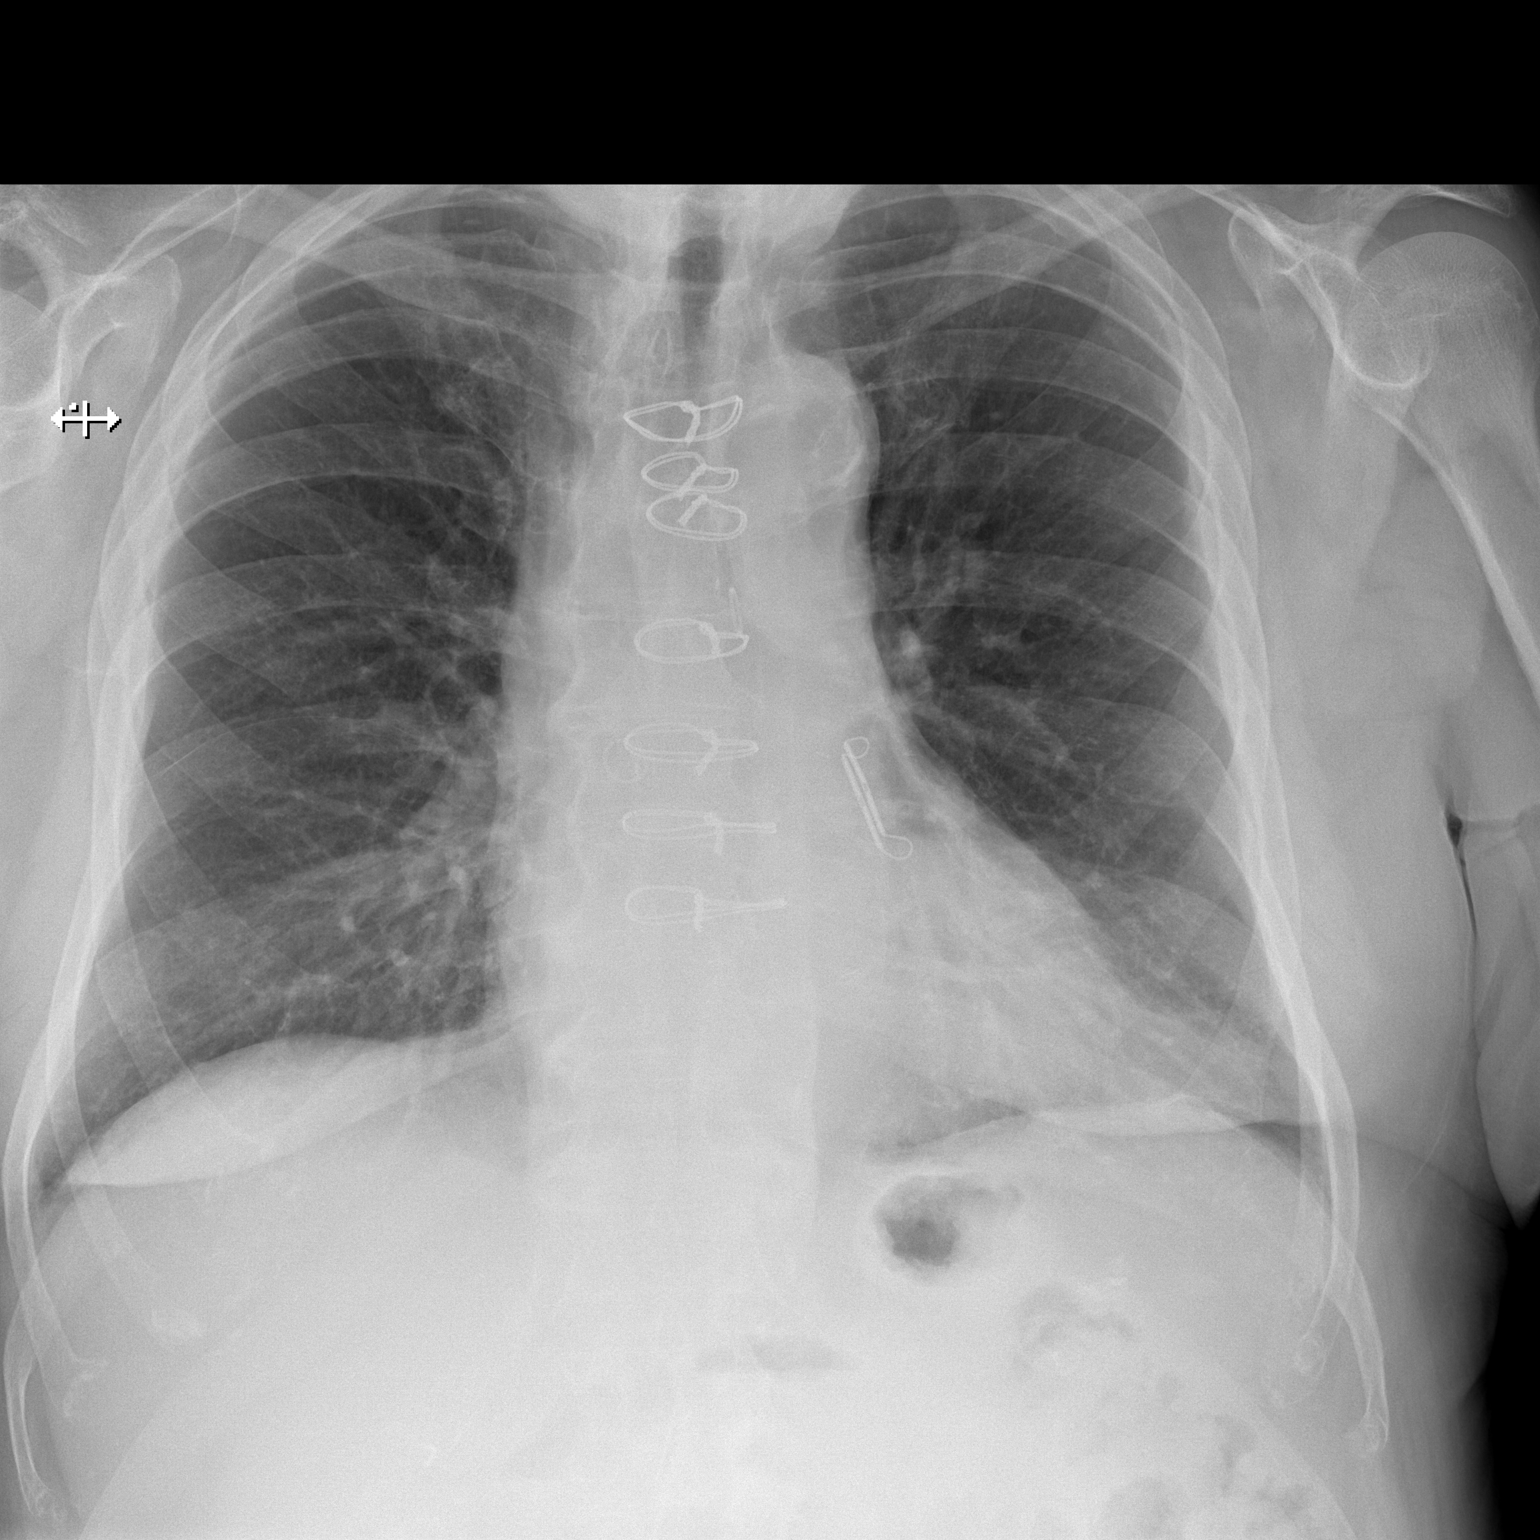

[w chest lat]
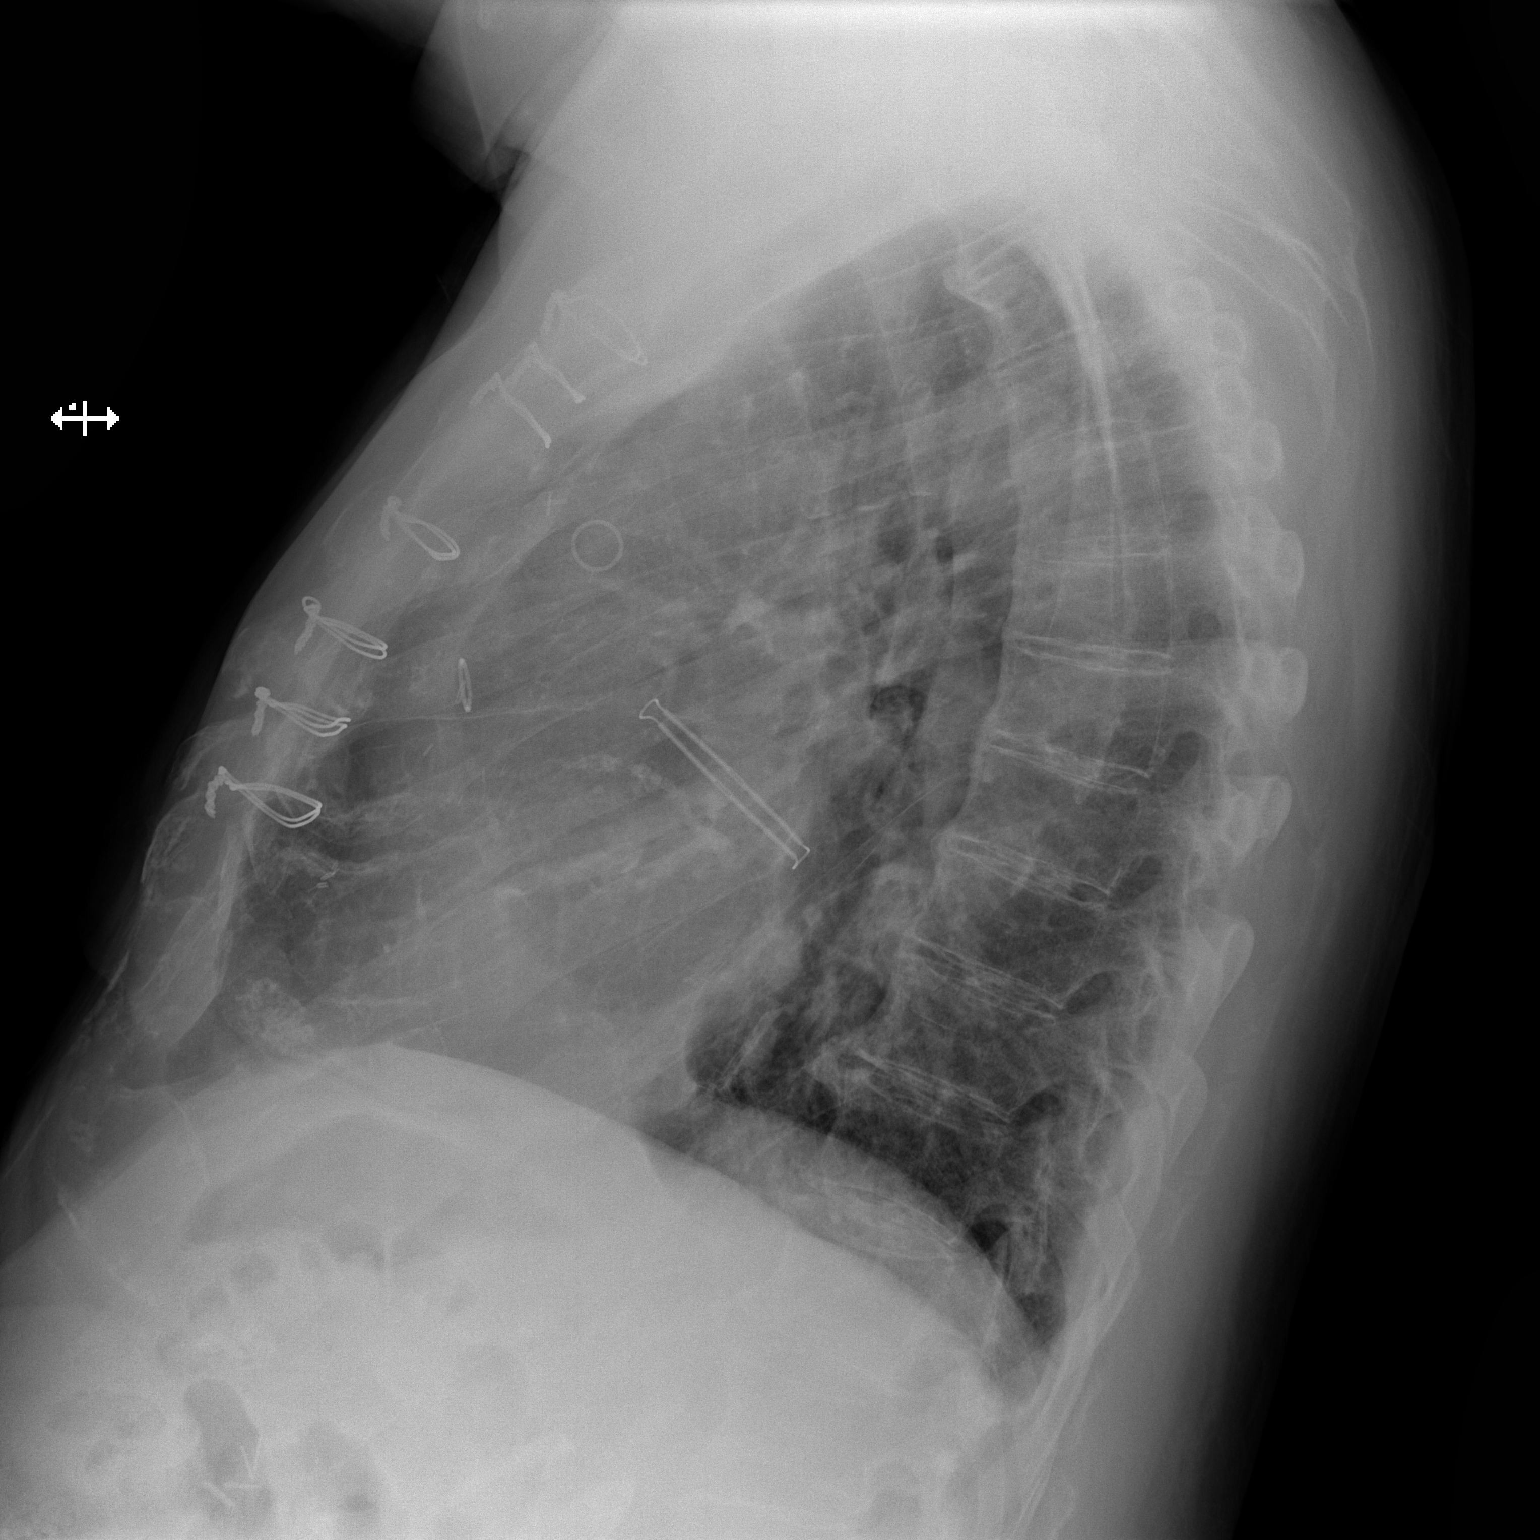

[2 of 2 positions shown; findings below may reference images not displayed]

FINDINGS: Stable borderline enlarged cardiac silhouette, post CABG changes and
left atrial clip. Clear lungs with normal vascularity. Thoracic
spine degenerative changes, including changes of DISH
IMPRESSION: No acute abnormality.

## 2021-03-21 DIAGNOSIS — I10 Essential (primary) hypertension: Secondary | ICD-10-CM | POA: Diagnosis not present

## 2021-03-21 DIAGNOSIS — R339 Retention of urine, unspecified: Secondary | ICD-10-CM | POA: Diagnosis not present

## 2021-03-21 DIAGNOSIS — R4182 Altered mental status, unspecified: Secondary | ICD-10-CM | POA: Diagnosis not present

## 2021-03-21 DIAGNOSIS — I252 Old myocardial infarction: Secondary | ICD-10-CM | POA: Diagnosis not present

## 2021-03-21 NOTE — Progress Notes (Signed)
?HISTORY AND PHYSICAL  ? ? ? ?CC:  follow up. ?Requesting Provider:  [name], MD ? ?HPI: This is a 88 y.o. male who is here today for follow up for PAD.  He has hx of angiogram with PTA balloon angioplasty by Dr. Virl Cagey on 10/26/2020 for a right heel wound.  He has a PMH significant for CKD stage II, Hypertension, CHF, afib, hyperlipidemia, Obesity, chronic urethral obstruction with indwelling foley catheter, AS s/p TAVR, CAD with hx of CABG.  The pt is non ambulatory secondary to chronic vertebral body compression.  ? ?The pt returns today for follow up and accompanied by his wife and daughter.  He tells me that he has wounds on both feet.  He is receiving wound care at the wound center and by his wife.  He and his wife both feel they are slowly getting better.  His biggest complaint is the numbness/pain in his toes, especially on the left.  He is currently at [facility].  He does not walk.  He can take about 10 steps with a walker.  He does have swelling in his legs.  He has compression with zippers but has not been wearing them lately.  He elevates legs but not above his heart.  ? ?He tells me while waiting to be seen, he saw his old neighbor in the lobby.  They used to work together doing projects in [employer]. ? ?The pt is on a statin for cholesterol management.    ?The pt is on an aspirin.    Other AC:  Xarelto ?The pt is on CCB, BB for hypertension.  ?The pt  does not have diabetes. ?Tobacco hx:  former ? ? ?Past Medical History:  ?Diagnosis Date  ? AAA (abdominal aortic aneurysm)   ? Acute coronary syndrome ([place]) 02/29/2012  ? Acute systolic heart failure ([place]) 03/03/2012  ? Angina   ? Arthritis   ? Ascending aorta dilatation ([place]) 03/13/2012  ? Fusiform dilatation of the ascending thoracic aorta discovered during surgery, max diameter 4.2-4.3 cm  ? Atrial fibrillation ([place]) 02/29/2012  ? Recurrent paroxysmal, new-onset  ? BPH (benign prostatic hypertrophy)   ? CAD (coronary artery disease)   ? Cath  April 2012 40% left main, 50% LAD, 40% OM, occluded RCA with L-R collaterals   ? CHF (congestive heart failure) ([place])   ? Chronic venous insufficiency   ? Dyspnea   ? GERD (gastroesophageal reflux disease)   ? Glaucoma   ? Gout   ? Headache   ? MIGRAINES in the past  ? History of kidney stones   ? Hyperlipidemia   ? Hypertension   ? MI (myocardial infarction) ([place])   ? Obesity (BMI 30-39.9)   ? Paroxysmal atrial fibrillation ([place]) 02/29/2012  ? Recurrent paroxysmal, new-onset   ? PMR (polymyalgia rheumatica) ([facility])   ? Polymyalgia rheumatica ([place]) 02/06/2019  ? Pre-diabetes   ? S/P CABG x 3 03/06/2012  ? LIMA to LAD, SVG to OM, SVG to RCA, EVH via right thigh  ? S/P Maze operation for atrial fibrillation 03/06/2012  ? Complete bilateral lesions set using bipolar radiofrequency and cryothermy ablation with clipping of LA appendage  ? 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  ? s/p TAVR with a 26 mm Edwards via the left subclavian by Drs Buena Irish and Cyndia Bent  ? Severe aortic stenosis 09/29/2019  ? Sleep apnea   ? USES CPAP NIGHTLY  ? ? ?Past Surgical History:  ?Procedure Laterality Date  ?  ABDOMINAL AORTOGRAM W/LOWER EXTREMITY N/A 10/26/2020  ? Procedure: ABDOMINAL AORTOGRAM W/LOWER EXTREMITY;  Surgeon: Broadus John, MD;  Location: [place] [facility];  Service: Cardiovascular;  Laterality: N/A;  ? CARDIAC CATHETERIZATION    ? CATARACT EXTRACTION, BILATERAL    ? with lens implants  ? CHOLECYSTECTOMY N/A 03/24/2012  ? Procedure: LAPAROSCOPIC CHOLECYSTECTOMY WITH INTRAOPERATIVE CHOLANGIOGRAM;  Surgeon: Zenovia Jarred, MD;  Location: [place];  Service: General;  Laterality: N/A;  ? CORONARY ARTERY BYPASS GRAFT N/A 03/06/2012  ? Procedure: CORONARY ARTERY BYPASS GRAFTING (CABG);  Surgeon: Rexene Alberts, MD;  Location: [place];  Service: Open Heart Surgery;  Laterality: N/A;  ? ENDOVEIN HARVEST OF GREATER SAPHENOUS VEIN Right 03/06/2012  ? Procedure: ENDOVEIN HARVEST OF GREATER SAPHENOUS VEIN;  Surgeon: Rexene Alberts, MD;  Location: [place];  Service: Open Heart Surgery;  Laterality: Right;  ? INTRAOPERATIVE TRANSESOPHAGEAL ECHOCARDIOGRAM N/A 03/06/2012  ? Procedure: INTRAOPERATIVE TRANSESOPHAGEAL ECHOCARDIOGRAM;  Surgeon: Rexene Alberts, MD;  Location: [place];  Service: Open Heart Surgery;  Laterality: N/A;  ? IR KYPHO LUMBAR INC FX REDUCE BONE BX UNI/BIL CANNULATION INC/IMAGING  02/23/2020  ? LEFT HEART CATH Right 03/02/2012  ? Procedure: LEFT HEART CATH;  Surgeon: Sherren Mocha, MD;  Location: [facility] CATH LAB;  Service: Cardiovascular;  Laterality: Right;  ? MAZE N/A 03/06/2012  ? Procedure: MAZE;  Surgeon: Rexene Alberts, MD;  Location: [place];  Service: Open Heart Surgery;  Laterality: N/A;  ? PERIPHERAL VASCULAR INTERVENTION Right 10/26/2020  ? Procedure: PERIPHERAL VASCULAR INTERVENTION;  Surgeon: Broadus John, MD;  Location: [place] [facility];  Service: Cardiovascular;  Laterality: Right;  ? PROSTATECTOMY    ? partial  ? RIGHT/LEFT HEART CATH AND CORONARY/GRAFT ANGIOGRAPHY N/A 09/29/2019  ? Procedure: RIGHT/LEFT HEART CATH AND CORONARY/GRAFT ANGIOGRAPHY;  Surgeon: Adrian Prows, MD;  Location: [place] [facility];  Service: Cardiovascular;  Laterality: N/A;  ? TEE WITHOUT CARDIOVERSION N/A 11/10/2019  ? Procedure: TRANSESOPHAGEAL ECHOCARDIOGRAM (TEE);  Surgeon: Burnell Blanks, MD;  Location: [place];  Service: Open Heart Surgery;  Laterality: N/A;  ? TRANSCATHETER AORTIC VALVE REPLACEMENT, TRANSFEMORAL  11/10/2019  ? ? ?Allergies  ?Allergen Reactions  ? Amoxicillin Other (See Comments)  ?  Headache, debilitated  ? Bee Venom Anaphylaxis  ? Avelox [Moxifloxacin] Other (See Comments)  ?  Unknown  ? Codeine Other (See Comments)  ? Duloxetine Hcl Other (See Comments)  ?  felt weird  ? Levitra [Vardenafil] Other (See Comments)  ?  Unknown  ? Morphine And Related Other (See Comments)  ?  hypotension  ? Penicillin G Sodium Other (See Comments)  ?  Severe headache, fatigue "debilitated"  ? Testosterone Other (See Comments)  ?   unknown  ? Tizanidine Other (See Comments)  ?  unknown  ? Viagra [Sildenafil] Other (See Comments)  ?  didn't like it  ? ? ?Current Outpatient Medications  ?Medication Sig Dispense Refill  ? acetaminophen (TYLENOL) 500 MG tablet Take 2 tablets (1,000 mg total) by mouth 3 (three) times daily. For 3 to 5 days and then change to 1000 mg 3 times daily as needed for pain. (Patient taking differently: Take 500 mg by mouth as needed for mild pain.)    ? amiodarone (PACERONE) 200 MG tablet Take 1 tablet (200 mg total) by mouth daily. 90 tablet 3  ? amLODipine (NORVASC) 5 MG tablet Take 5 mg by mouth daily. (Patient not taking: Reported on 03/15/2021)    ? aspirin EC 81 MG  EC tablet Take 1 tablet (81 mg total) by mouth daily. Swallow whole. 30 tablet 11  ? atorvastatin (LIPITOR) 40 MG tablet Take 1 tablet (40 mg total) by mouth daily. 90 tablet 1  ? cadexomer iodine (IODOSORB) 0.9 % gel Apply 1 application. topically every other day as needed for wound care.    ? cholecalciferol (VITAMIN D3) 25 MCG (1000 UNIT) tablet Take 1,000 Units by mouth daily.     ? Cyanocobalamin (VITAMIN B-12) 5000 MCG TBDP Take 5,000 mcg by mouth daily.    ? denosumab (PROLIA) 60 MG/ML SOSY injection Inject 60 mg into the skin every 6 (six) months.    ? docusate sodium (COLACE) 100 MG capsule Take 1 capsule (100 mg total) by mouth 2 (two) times daily. (Patient taking differently: Take 100 mg by mouth every evening.)    ? EPINEPHrine 0.3 mg/0.3 mL IJ SOAJ injection Inject 0.3 mLs (0.3 mg total) into the muscle as needed for anaphylaxis. (Patient taking differently: Inject 0.3 mg into the muscle once as needed for anaphylaxis.) 2 each 2  ? ezetimibe (ZETIA) 10 MG tablet Take 10 mg by mouth daily.    ? finasteride (PROSCAR) 5 MG tablet Take 5 mg by mouth daily.    ? fish oil-omega-3 fatty acids 1000 MG capsule Take 1 g by mouth daily.     ? fluticasone (FLONASE) 50 MCG/ACT nasal spray Place 1 spray into both nostrils daily as needed for allergies.    ?  gabapentin (NEURONTIN) 100 MG capsule Take 100-200 mg by mouth See admin instructions. '200mg'$  in the morning, '100mg'$  in the evening, and '200mg'$  at bedtime    ? galantamine (RAZADYNE ER) 8 MG 24 hr capsule Take

## 2021-03-22 ENCOUNTER — Encounter (HOSPITAL_BASED_OUTPATIENT_CLINIC_OR_DEPARTMENT_OTHER): Payer: Medicare Other | Admitting: Physician Assistant

## 2021-03-22 ENCOUNTER — Other Ambulatory Visit: Payer: Self-pay

## 2021-03-22 DIAGNOSIS — I129 Hypertensive chronic kidney disease with stage 1 through stage 4 chronic kidney disease, or unspecified chronic kidney disease: Secondary | ICD-10-CM | POA: Diagnosis not present

## 2021-03-22 DIAGNOSIS — L97322 Non-pressure chronic ulcer of left ankle with fat layer exposed: Secondary | ICD-10-CM | POA: Diagnosis not present

## 2021-03-22 DIAGNOSIS — L97312 Non-pressure chronic ulcer of right ankle with fat layer exposed: Secondary | ICD-10-CM | POA: Diagnosis not present

## 2021-03-22 DIAGNOSIS — N183 Chronic kidney disease, stage 3 unspecified: Secondary | ICD-10-CM | POA: Diagnosis not present

## 2021-03-22 DIAGNOSIS — L89613 Pressure ulcer of right heel, stage 3: Secondary | ICD-10-CM | POA: Diagnosis not present

## 2021-03-22 DIAGNOSIS — L97822 Non-pressure chronic ulcer of other part of left lower leg with fat layer exposed: Secondary | ICD-10-CM | POA: Diagnosis not present

## 2021-03-22 DIAGNOSIS — I7389 Other specified peripheral vascular diseases: Secondary | ICD-10-CM | POA: Diagnosis not present

## 2021-03-22 DIAGNOSIS — I872 Venous insufficiency (chronic) (peripheral): Secondary | ICD-10-CM | POA: Diagnosis not present

## 2021-03-22 DIAGNOSIS — L98492 Non-pressure chronic ulcer of skin of other sites with fat layer exposed: Secondary | ICD-10-CM | POA: Diagnosis not present

## 2021-03-22 DIAGNOSIS — N401 Enlarged prostate with lower urinary tract symptoms: Secondary | ICD-10-CM | POA: Diagnosis not present

## 2021-03-22 NOTE — Progress Notes (Signed)
[name] [name], [name] I. ([id number]) ?Visit Report for 03/22/2021 ?Arrival Information Details ?Patient Name: Date of Service: ?[name], [name] 03/22/2021 3:00 PM ?Medical Record Number: [id number] ?Patient Account Number: [id number] ?Date of Birth/Sex: Treating RN: ?25-Sep-1926 (88 y.o. M) [name] [name], [name] [name] ?Primary Care Cassie Henkels: [name] Other Clinician: ?Referring Santonio Speakman: ?Treating Sunjai Levandoski/Extender: [name] ?Perini, Mark A ?Weeks in Treatment: 0 ?Visit Information History Since Last Visit ?Added or deleted any medications: No ?Patient Arrived: Wheel Chair ?Any new allergies or adverse reactions: No ?Arrival Time: 15:30 ?Had a fall or experienced change in No ?Accompanied By: wife ?activities of daily living that may affect ?Transfer Assistance: None ?risk of falls: ?Patient Identification Verified: Yes ?Signs or symptoms of abuse/neglect since last visito No ?Secondary Verification Process Completed: Yes ?Hospitalized since last visit: Yes ?Patient Requires Transmission-Based Precautions: No ?Implantable device outside of the clinic excluding No ?Patient Has Alerts: Yes ?cellular tissue based products placed in the center ?Patient Alerts: Patient on Blood Thinner since last visit: ?Has Dressing in Place as Prescribed: Yes ?Pain Present Now: No ?Notes ?Per patient was inpatient within last appt and this appt time. Per patient and wife UTI while in hospital and now in [place]. ?Electronic Signature(s) ?Signed: 03/22/2021 5:40:22 PM By: Deon Pilling RN, BSN ?Entered By: Deon Pilling on 03/22/2021 15:42:28 ?-------------------------------------------------------------------------------- ?Clinic Level of Care Assessment Details ?Patient Name: Date of Service: ?[name], [name] 03/22/2021 3:00 PM ?Medical Record Number: [id number] ?Patient Account Number: [id number] ?Date of Birth/Sex: Treating RN: ?Sep 22, 1926 (88 y.o. M) [name] [name], [name] [name] ?Primary Care Teriyah Purington: [name] Other Clinician: ?Referring Jaileen Janelle: ?Treating  Shaketha Jeon/Extender: [name] ?Perini, Mark A ?Weeks in Treatment: 0 ?Clinic Level of Care Assessment Items ?TOOL 4 Quantity Score ?X- 1 0 ?Use when only an EandM is performed on FOLLOW-UP visit ?ASSESSMENTS - Nursing Assessment / Reassessment ?X- 1 10 ?Reassessment of Co-morbidities (includes updates in patient status) ?X- 1 5 ?Reassessment of Adherence to Treatment Plan ?ASSESSMENTS - Wound and Skin A ssessment / Reassessment ?'[]'$  - 0 ?Simple Wound Assessment / Reassessment - one wound ?X- 6 5 ?Complex Wound Assessment / Reassessment - multiple wounds ?X- 1 10 ?Dermatologic / Skin Assessment (not related to wound area) ?ASSESSMENTS - Focused Assessment ?X- 2 5 ?Circumferential Edema Measurements - multi extremities ?'[]'$  - 0 ?Nutritional Assessment / Counseling / Intervention ?'[]'$  - 0 ?Lower Extremity Assessment (monofilament, tuning fork, pulses) ?'[]'$  - 0 ?Peripheral Arterial Disease Assessment (using hand held doppler) ?ASSESSMENTS - Ostomy and/or Continence Assessment and Care ?'[]'$  - 0 ?Incontinence Assessment and Management ?'[]'$  - 0 ?Ostomy Care Assessment and Management (repouching, etc.) ?PROCESS - Coordination of Care ?'[]'$  - 0 ?Simple Patient / Family Education for ongoing care ?X- 1 20 ?Complex (extensive) Patient / Family Education for ongoing care ?X- 1 10 ?Staff obtains Consents, Records, T Results / Process Orders ?est ?X- 1 10 ?Staff telephones HHA, Nursing Homes / Clarify orders / etc ?'[]'$  - 0 ?Routine Transfer to another Facility (non-emergent condition) ?'[]'$  - 0 ?Routine Hospital Admission (non-emergent condition) ?'[]'$  - 0 ?New Admissions / Biomedical engineer / Ordering NPWT Apligraf, etc. ?, ?'[]'$  - 0 ?Emergency Hospital Admission (emergent condition) ?'[]'$  - 0 ?Simple Discharge Coordination ?X- 1 15 ?Complex (extensive) Discharge Coordination ?PROCESS - Special Needs ?'[]'$  - 0 ?Pediatric / Minor Patient Management ?'[]'$  - 0 ?Isolation Patient Management ?'[]'$  - 0 ?Hearing / Language / Visual special  needs ?'[]'$  - 0 ?Assessment of Community assistance (transportation, D/C planning, etc.) ?'[]'$  - 0 ?Additional  assistance / Altered mentation ?X- 1 15 ?Support Surface(s) Assessment (bed, cushion, seat, etc.) ?INTERVENTIONS - Wound Cleansing / Measurement ?'[]'$  - 0 ?Simple Wound Cleansing - one wound ?X- 6 5 ?Complex Wound Cleansing - multiple wounds ?X- 1 5 ?Wound Imaging (photographs - any number of wounds) ?'[]'$  - 0 ?Wound Tracing (instead of photographs) ?'[]'$  - 0 ?Simple Wound Measurement - one wound ?X- 6 5 ?Complex Wound Measurement - multiple wounds ?INTERVENTIONS - Wound Dressings ?X - Small Wound Dressing one or multiple wounds 4 10 ?X- 1 15 ?Medium Wound Dressing one or multiple wounds ?'[]'$  - 0 ?Large Wound Dressing one or multiple wounds ?'[]'$  - 0 ?Application of Medications - topical ?'[]'$  - 0 ?Application of Medications - injection ?INTERVENTIONS - Miscellaneous ?'[]'$  - 0 ?External ear exam ?'[]'$  - 0 ?Specimen Collection (cultures, biopsies, blood, body fluids, etc.) ?'[]'$  - 0 ?Specimen(s) / Culture(s) sent or taken to Lab for analysis ?'[]'$  - 0 ?Patient Transfer (multiple staff / Civil Service fast streamer / Similar devices) ?'[]'$  - 0 ?Simple Staple / Suture removal (25 or less) ?'[]'$  - 0 ?Complex Staple / Suture removal (26 or more) ?'[]'$  - 0 ?Hypo / Hyperglycemic Management (close monitor of Blood Glucose) ?'[]'$  - 0 ?Ankle / Brachial Index (ABI) - do not check if billed separately ?X- 1 5 ?Vital Signs ?Has the patient been seen at the hospital within the last three years: Yes ?Total Score: 260 ?Level Of Care: New/Established - Level 5 ?Electronic Signature(s) ?Signed: 03/22/2021 5:40:22 PM By: Deon Pilling RN, BSN ?Entered By: Deon Pilling on 03/22/2021 17:04:26 ?-------------------------------------------------------------------------------- ?Encounter Discharge Information Details ?Patient Name: Date of Service: ?[name], [name] 03/22/2021 3:00 PM ?Medical Record Number: [id number] ?Patient Account Number: [id number] ?Date of Birth/Sex:  Treating RN: ?Apr 14, 1926 (88 y.o. M) [name] [name], [name] [name] ?Primary Care Esdras Delair: [name] Other Clinician: ?Referring Cliffie Gingras: ?Treating Valetta Mulroy/Extender: [name] ?Perini, Mark A ?Weeks in Treatment: 0 ?Encounter Discharge Information Items ?Discharge Condition: Stable ?Ambulatory Status: Wheelchair ?Discharge Destination: [place] ?Telephoned: No ?Orders Sent: Yes ?Transportation: Private Auto ?Accompanied By: wife ?Schedule Follow-up Appointment: Yes ?Clinical Summary of Care: ?Electronic Signature(s) ?Signed: 03/22/2021 5:40:22 PM By: Deon Pilling RN, BSN ?Entered By: Deon Pilling on 03/22/2021 17:05:27 ?-------------------------------------------------------------------------------- ?Lower Extremity Assessment Details ?Patient Name: Date of Service: ?[name], [name] 03/22/2021 3:00 PM ?Medical Record Number: [id number] ?Patient Account Number: [id number] ?Date of Birth/Sex: Treating RN: ?1926/03/30 (88 y.o. M) [name] [name], [name] [name] ?Primary Care Dwayna Kentner: [name] Other Clinician: ?Referring Bridey Brookover: ?Treating Leslie Jester/Extender: [name] ?Perini, Mark A ?Weeks in Treatment: 0 ?Edema Assessment ?Assessed: [Left: Yes] [Right: Yes] ?Edema: [Left: Yes] [Right: Yes] ?Calf ?Left: Right: ?Point of Measurement: 34 cm From Medial Instep 43 cm 43 cm ?Ankle ?Left: Right: ?Point of Measurement: 11 cm From Medial Instep 26.5 cm 30 cm ?Vascular Assessment ?Pulses: ?Dorsalis Pedis ?Palpable: [Left:Yes] [Right:Yes] ?Electronic Signature(s) ?Signed: 03/22/2021 5:40:22 PM By: Deon Pilling RN, BSN ?Entered By: Deon Pilling on 03/22/2021 15:43:19 ?-------------------------------------------------------------------------------- ?Multi-Disciplinary Care Plan Details ?Patient Name: ?Date of Service: ?[name], [name] 03/22/2021 3:00 PM ?Medical Record Number: [id number] ?Patient Account Number: [id number] ?Date of Birth/Sex: ?Treating RN: ?17-Oct-1926 (88 y.o. M) [name] [name], [name] [name] ?Primary Care Garnie Borchardt: [name] ?Other Clinician: ?Referring Aislinn Feliz: ?Treating Shenae Bonanno/Extender: [name] ?Perini, Mark A ?Weeks in Treatment: 0 ?Active Inactive ?Abuse / Safety / Falls / Self Care Management ?Nursing Diagnose

## 2021-03-22 NOTE — Progress Notes (Addendum)
Firthcliffe, Melton I. ([id number]) ?Visit Report for 03/22/2021 ?Chief Complaint Document Details ?Patient Name: Date of Service: ?[name] [name], [name] V ID I. 03/22/2021 3:00 PM ?Medical Record Number: [id number] ?Patient Account Number: [id number] ?Date of Birth/Sex: Treating RN: ?07/31/1926 (88 y.o. M) ?Primary Care Provider: [name] Other Clinician: ?Referring Provider: ?Treating Provider/Extender: [name] ?Perini, Mark A ?Weeks in Treatment: 0 ?Information Obtained from: Patient ?Chief Complaint ?Pressure ulcer right heel and left LE and ankle ulcers. 03/22/21 new sacral pressure ulcer. ?Electronic Signature(s) ?Signed: 03/22/2021 4:15:39 PM By: [name] PA-C ?Entered By: [name] on 03/22/2021 16:15:39 ?-------------------------------------------------------------------------------- ?HPI Details ?Patient Name: Date of Service: ?[name] [name], [name] V ID I. 03/22/2021 3:00 PM ?Medical Record Number: [id number] ?Patient Account Number: [id number] ?Date of Birth/Sex: Treating RN: ?May 09, 1926 (88 y.o. M) ?Primary Care Provider: [name] Other Clinician: ?Referring Provider: ?Treating Provider/Extender: [name] ?Perini, Mark A ?Weeks in Treatment: 0 ?History of Present Illness ?HPI Description: 09/21/2020 upon evaluation today patient appears to be doing somewhat poorly in regard to a wound in the right lateral heel location. This ?fortunately does not appear to be too bad but nonetheless is definitely a spot that we need to work on try to get healed for him. Has been here for quite some ?time. Fortunately there does not appear to be any evidence of active infection at this time which is great news. With that being said I do believe that he has ?some evidence here of peripheral vascular disease this is minimal his ABI 0.87. Other than that he does have a history of BPH, chronic kidney disease stage ?III, and hypertension. Currently he has been using different medication such as Silvadene or antibiotic ointment to  try to help treat things. This just is not ?helping out as efficiently as it should be. ?Of note patient's history includes that of a triple bypass in 2010, a kyphoplasty at L1 which was done 8 months ago, unfortunately he had a fracture 2 months ?ago which she sustained a T7/T8 fracture to his spine which ended up with him being in the hospital. It is in that time since then that he developed this wound ?roughly 2 to 3 months ago is not exactly sure when. He does typically wear compression socks in the 20 to 30 mmHg range. ?09/28/2020 upon evaluation today patient presents for follow-up and overall seems to be doing more poorly based on what I am seeing currently. His ABI last ?time was measured at 0.87 but I am beginning to think this may be falsely elevated due to the fact that there is more eschar and subsequently a concern here ?of vascular compromise that may be part of her issue. I think that we need to avoid any additional sharp debridement and make a referral to vascular surgery ?as soon as possible here. Subsequently also went ahead and did discuss the possibility of starting the patient on Santyl instead of the collagen in order to help ?clean up the surface of the wound this will take the place of sharp debridement currently. ?10/19/2020 upon evaluation today patient's wound actually appears to be doing okay although he has an area on the medial aspect of his heel that is only been ?showing up for the past couple of days this is more of a blister that has ruptured. The good news is there is no deep wound here which is good. Fortunately ?there is no signs of active infection at this time also good news. With that  being said however the blood flow was not good with an ABI of 0.67 on the right it ?was around 1 on the left nonetheless I think he does need to see vascular we put that referral in last week on the 12th Dr. Dellia Nims reviewed that for me and ?made that recommendations. Nonetheless the patient has  not heard anything from vascular yet as far as getting this scheduled. I think this needs to be done ?sooner rather than later as I am concerned about the possibility of a limb threatening situation if we do not get this under control.. ?11/16/2020 upon evaluation today patient presents for follow-up regarding issues that he has been having with his bilateral lower extremities in particular. He in ?fact still has an issue with his right heel. This appears to be a stage III pressure ulcer at this point there is some slough noted. Subsequently he does have ?revascularization that is taking place on the right leg as a follow-up later this month in that regard. No repeat ABIs. Subsequently the patient also has venous ?stasis bilaterally which is also problematic for him at this time. He has some ulcers on the left ankle and left leg that are getting need to be addressed as well. ?12/21/2020 upon evaluation today patient appears to be doing poorly in regard to his bilateral lower extremity ulcers. He still has areas on both lower extremities ?currently ineffective even some areas that were not there previous. Fortunately there is no signs of active infection at this time. No fevers, chills, nausea, ?vomiting, or diarrhea. ?01/04/21 upon evaluation today patient actually appears to be doing quite well in regard to his wounds. Fortunately there does not appear to be any signs of ?active infection which is great. There is some necrotic tissue that I think would be helpful to debride away pretty much on all wounds other than the heel which ?appears to be doing quite well. ?01/25/2021 upon evaluation today patient appears to be doing okay in regard to his wounds. There are some measurements that are definitely smaller which is ?good news. Fortunately I do not see any evidence of active infection locally nor systemically at this point which is also great news. He does have a deep ?tissue injury which seems to be getting a bit  worse on the lateral portion of his right heel. He tells me is not been using the bunny boots and he has not been ?using a pillow under his calves either. Nonetheless I think this is absolutely a pressure injury and I am very concerned about this. Also I do not want this to get ?a lot worse. He also tells me his hemoglobin is around 7 he is going to be seeing hematology for a transfusion either today or tomorrow most likely this will ?definitely help with his healing. ?02/01/2021 upon evaluation today patient actually appears to be making excellent progress in regard to his wound currently. Fortunately I feel like that all the ?wounds are showing signs of improvement the Iodoflex seems to be doing an awesome job. I am very pleased with where we stand today. There does not ?appear to be any signs of active infection. ?02/22/2021 upon evaluation today patient appears to be doing well with regard to his wound. He has been tolerating the dressing changes without complication. ?Fortunately there does not appear to be any evidence of active infection locally or systemically at this point which is good news. Overall I think that we are ?headed in the right direction and  the Iodoflex is doing a good job. ?03/08/2021 upon evaluation today patient appears to be doing well all things considered with regard to his wounds. He has been tolerating the dressing changes ?without complication. Fortunately there does not appear to be any evidence of active infection locally nor systemically which is great news overall I think the ?Iodosorb is doing an awesome job here. ?03/22/2021 upon evaluation today patient appears to be doing well with regard to his wounds for the most part. The biggest issue is that he does have a new ?wound on his gluteal region really more sacral than anything to be honest. Unfortunately this is going to be something that we need to address sooner rather ?than later. It does not appear to be too bad right now but  obviously things can get significantly worse. ?Electronic Signature(s) ?Signed: 03/22/2021 5:12:47 PM By: [name] PA-C ?Entered By: [name] on 03/22/2021 17:12:46 ?----------------------------

## 2021-03-24 ENCOUNTER — Ambulatory Visit: Payer: Medicare Other | Admitting: Physician Assistant

## 2021-03-24 ENCOUNTER — Ambulatory Visit (INDEPENDENT_AMBULATORY_CARE_PROVIDER_SITE_OTHER)
Admission: RE | Admit: 2021-03-24 | Discharge: 2021-03-24 | Disposition: A | Discharge status: Home / Self Care | Payer: Medicare Other | Source: Ambulatory Visit | Attending: Physician Assistant | Admitting: Physician Assistant

## 2021-03-24 ENCOUNTER — Other Ambulatory Visit: Payer: Self-pay

## 2021-03-24 ENCOUNTER — Encounter: Payer: Self-pay | Admitting: Physician Assistant

## 2021-03-24 ENCOUNTER — Ambulatory Visit (HOSPITAL_COMMUNITY)
Admission: RE | Admit: 2021-03-24 | Discharge: 2021-03-24 | Disposition: A | Discharge status: Home / Self Care | Payer: Medicare Other | Source: Ambulatory Visit | Attending: Vascular Surgery | Admitting: Vascular Surgery

## 2021-03-24 VITALS — BP 153/63 | HR 61 | Temp 97.9°F | Resp 20 | Ht 72.0 in

## 2021-03-24 DIAGNOSIS — I739 Peripheral vascular disease, unspecified: Secondary | ICD-10-CM

## 2021-03-24 DIAGNOSIS — M545 Low back pain, unspecified: Secondary | ICD-10-CM | POA: Diagnosis not present

## 2021-04-05 ENCOUNTER — Encounter (HOSPITAL_BASED_OUTPATIENT_CLINIC_OR_DEPARTMENT_OTHER): Payer: Medicare Other | Attending: Physician Assistant | Admitting: Physician Assistant

## 2021-04-05 ENCOUNTER — Other Ambulatory Visit: Payer: Self-pay

## 2021-04-05 DIAGNOSIS — I129 Hypertensive chronic kidney disease with stage 1 through stage 4 chronic kidney disease, or unspecified chronic kidney disease: Secondary | ICD-10-CM | POA: Insufficient documentation

## 2021-04-05 DIAGNOSIS — L97312 Non-pressure chronic ulcer of right ankle with fat layer exposed: Secondary | ICD-10-CM | POA: Insufficient documentation

## 2021-04-05 DIAGNOSIS — L89153 Pressure ulcer of sacral region, stage 3: Secondary | ICD-10-CM | POA: Insufficient documentation

## 2021-04-05 DIAGNOSIS — N401 Enlarged prostate with lower urinary tract symptoms: Secondary | ICD-10-CM | POA: Diagnosis not present

## 2021-04-05 DIAGNOSIS — I872 Venous insufficiency (chronic) (peripheral): Secondary | ICD-10-CM | POA: Insufficient documentation

## 2021-04-05 DIAGNOSIS — L89613 Pressure ulcer of right heel, stage 3: Secondary | ICD-10-CM | POA: Diagnosis not present

## 2021-04-05 DIAGNOSIS — I739 Peripheral vascular disease, unspecified: Secondary | ICD-10-CM | POA: Diagnosis not present

## 2021-04-05 DIAGNOSIS — L97822 Non-pressure chronic ulcer of other part of left lower leg with fat layer exposed: Secondary | ICD-10-CM | POA: Diagnosis not present

## 2021-04-05 DIAGNOSIS — N183 Chronic kidney disease, stage 3 unspecified: Secondary | ICD-10-CM | POA: Diagnosis not present

## 2021-04-05 DIAGNOSIS — L97322 Non-pressure chronic ulcer of left ankle with fat layer exposed: Secondary | ICD-10-CM | POA: Insufficient documentation

## 2021-04-05 DIAGNOSIS — C44729 Squamous cell carcinoma of skin of left lower limb, including hip: Secondary | ICD-10-CM | POA: Diagnosis not present

## 2021-04-05 NOTE — Progress Notes (Signed)
[name] [name], [name] I. ([id number]) ?Visit Report for 04/05/2021 ?Arrival Information Details ?Patient Name: Date of Service: ?[name] [name], [name] V ID I. 04/05/2021 2:15 PM ?Medical Record Number: [id number] ?Patient Account Number: [id number] ?Date of Birth/Sex: Treating RN: ?12/28/1926 (88 y.o. M) [name], [name] ?Primary Care Ewan Grau: [name] Other Clinician: ?Referring Lillian Ballester: ?Treating Khalia Gong/Extender: [name] ?Perini, Mark A ?Weeks in Treatment: 28 ?Visit Information History Since Last Visit ?Added or deleted any medications: No ?Patient Arrived: Wheel Chair ?Any new allergies or adverse reactions: No ?Arrival Time: 14:43 ?Had a fall or experienced change in No ?Accompanied By: wife ?activities of daily living that may affect ?Transfer Assistance: Manual ?risk of falls: ?Patient Identification Verified: Yes ?Signs or symptoms of abuse/neglect since last visito No ?Secondary Verification Process Completed: Yes ?Hospitalized since last visit: No ?Patient Requires Transmission-Based Precautions: No ?Implantable device outside of the clinic excluding No ?Patient Has Alerts: Yes ?cellular tissue based products placed in the center ?Patient Alerts: Patient on Blood Thinner since last visit: ?Has Dressing in Place as Prescribed: Yes ?Pain Present Now: No ?Electronic Signature(s) ?Signed: 04/05/2021 5:22:02 PM By: Deon Pilling RN, BSN ?Entered By: Deon Pilling on 04/05/2021 14:43:40 ?-------------------------------------------------------------------------------- ?Encounter Discharge Information Details ?Patient Name: Date of Service: ?[name] [name], [name] V ID I. 04/05/2021 2:15 PM ?Medical Record Number: [id number] ?Patient Account Number: [id number] ?Date of Birth/Sex: Treating RN: ?1926/06/30 (88 y.o. M) [name], [name] ?Primary Care [name]: [name] Other Clinician: ?Referring Ashlen Kiger: ?Treating Melvina Pangelinan/Extender: [name] ?Perini, Mark A ?Weeks in Treatment: 28 ?Encounter Discharge Information Items Post Procedure  Vitals ?Discharge Condition: Stable ?Temperature (F): 98 ?Ambulatory Status: Wheelchair ?Pulse (bpm): 56 ?Discharge Destination: Home ?Respiratory Rate (breaths/min): 20 ?Transportation: Private Auto ?Blood Pressure (mmHg): 109/62 ?Accompanied By: wife ?Schedule Follow-up Appointment: Yes ?Clinical Summary of Care: ?Electronic Signature(s) ?Signed: 04/05/2021 5:22:02 PM By: Deon Pilling RN, BSN ?Entered By: Deon Pilling on 04/05/2021 17:11:22 ?-------------------------------------------------------------------------------- ?Lower Extremity Assessment Details ?Patient Name: ?Date of Service: ?[name] [name], [name] V ID I. 04/05/2021 2:15 PM ?Medical Record Number: [id number] ?Patient Account Number: [id number] ?Date of Birth/Sex: ?Treating RN: ?08/31/1926 (88 y.o. M) [name], [name] ?Primary Care Norina Cowper: [name] ?Other Clinician: ?Referring Cicilia Clinger: ?Treating Denelle Capurro/Extender: [name] ?Perini, Mark A ?Weeks in Treatment: 28 ?Edema Assessment ?Assessed: [Left: Yes] [Right: Yes] ?Edema: [Left: Yes] [Right: Yes] ?Calf ?Left: Right: ?Point of Measurement: 34 cm From Medial Instep 43 cm 43 cm ?Ankle ?Left: Right: ?Point of Measurement: 11 cm From Medial Instep 27 cm 30 cm ?Vascular Assessment ?Pulses: ?Dorsalis Pedis ?Palpable: [Left:Yes] [Right:Yes] ?Electronic Signature(s) ?Signed: 04/05/2021 5:22:02 PM By: Deon Pilling RN, BSN ?Entered By: Deon Pilling on 04/05/2021 15:03:44 ?-------------------------------------------------------------------------------- ?Multi-Disciplinary Care Plan Details ?Patient Name: ?Date of Service: ?[name] [name], [name] V ID I. 04/05/2021 2:15 PM ?Medical Record Number: [id number] ?Patient Account Number: [id number] ?Date of Birth/Sex: ?Treating RN: ?June 28, 1926 (87 y.o. M) [name], [name] ?Primary Care Dakayla Disanti: [name] ?Other Clinician: ?Referring Shaelee Forni: ?Treating Avalee Castrellon/Extender: [name] ?Perini, Mark A ?Weeks in Treatment: 28 ?Active Inactive ?Abuse / Safety / Falls / Self Care  Management ?Nursing Diagnoses: ?Potential for falls ?Goals: ?Patient will remain injury free related to falls ?Date Initiated: 03/22/2021 ?Target Resolution Date: 05/05/2021 ?Goal Status: Active ?Interventions: ?Assess: immobility, friction, shearing, incontinence upon admission and as needed ?Assess impairment of mobility on admission and as needed per policy ?Assess self care needs on admission and as needed ?Provide education on fall prevention ?Notes: ?Nutrition ?Nursing Diagnoses: ?Potential for alteratiion in Nutrition/Potential for imbalanced nutrition ?Goals: ?Patient/caregiver agrees  to and verbalizes understanding of need to obtain nutritional consultation ?Date Initiated: 03/22/2021 ?T arget Resolution Date: 04/28/2021 ?Goal Status: Active ?Interventions: ?Provide education on nutrition ?Treatment Activities: ?Education provided on Nutrition : 03/22/2021 ?Giving encouragement to exercise : 03/22/2021 ?Notes: ?Electronic Signature(s) ?Signed: 04/05/2021 5:22:02 PM By: Deon Pilling RN, BSN ?Entered By: Deon Pilling on 04/05/2021 15:31:30 ?-------------------------------------------------------------------------------- ?Non-Wound Condition Assessment Details ?Patient Name: Date of Service: ?[name] [name], [name] V ID I. 04/05/2021 2:15 PM ?Medical Record Number: [id number] ?Patient Account Number: [id number] ?Date of Birth/Sex: Treating RN: ?January 06, 1926 (88 y.o. M) [name], [name] ?Primary Care Deniese Oberry: [name] Other Clinician: ?Referring [name]: ?Treating Mathilda Maguire/Extender: [name] ?Perini, Mark A ?Weeks in Treatment: 28 ?Non-Wound Condition: ?Condition: Other Dermatologic Condition ?Location: Leg ?Side: Left ?Notes ?left anterior lower leg protruding non-wound area noted. ?Electronic Signature(s) ?Signed: 04/05/2021 5:22:02 PM By: Deon Pilling RN, BSN ?Entered By: Deon Pilling on 04/05/2021 15:22:20 ?-------------------------------------------------------------------------------- ?Pain Assessment Details ?Patient  Name: Date of Service: ?[name] [name], [name] V ID I. 04/05/2021 2:15 PM ?Medical Record Number: [id number] ?Patient Account Number: [id number] ?Date of Birth/Sex: Treating RN: ?05-02-1926 (88 y.o. M) [name], [name] ?Primary Care [name] Celestine: [name] Other Clinician: ?Referring Jomel Whittlesey: ?Treating Tiara Maultsby/Extender: [name] ?Perini, Mark A ?Weeks in Treatment: 28 ?Active Problems ?Location of Pain Severity and Description of Pain ?Patient Has Paino No ?Site Locations ?Rate the pain. ?Current Pain Level: 0 ?Pain Management and Medication ?Current Pain Management: ?Medication: No ?Cold Application: No ?Rest: No ?Massage: No ?Activity: No ?T.E.N.S.: No ?Heat Application: No ?Leg drop or elevation: No ?Is the Current Pain Management Adequate: Adequate ?How does your wound impact your activities of daily livingo ?Sleep: No ?Bathing: No ?Appetite: No ?Relationship With Others: No ?Bladder Continence: No ?Emotions: No ?Bowel Continence: No ?Work: No ?Toileting: No ?Drive: No ?Dressing: No ?Hobbies: No ?Electronic Signature(s) ?Signed: 04/05/2021 5:22:02 PM By: Deon Pilling RN, BSN ?Entered By: Deon Pilling on 04/05/2021 14:44:07 ?-------------------------------------------------------------------------------- ?Patient/Caregiver Education Details ?Patient Name: ?Date of Service: ?[name] [name], [name] V ID I. 4/5/2023andnbsp2:15 PM ?Medical Record Number: [id number] ?Patient Account Number: [id number] ?Date of Birth/Gender: ?Treating RN: ?[birth year] (88 y.o. M) [name], [name] ?Primary Care Physician: [name] ?Other Clinician: ?Referring Physician: ?Treating Physician/Extender: [name] ?Perini, Mark A ?Weeks in Treatment: 28 ?Education Assessment ?Education Provided To: ?Patient ?Education Topics Provided ?Safety: ?Handouts: Safety ?Methods: Explain/Verbal ?Responses: Reinforcements needed ?Electronic Signature(s) ?Signed: 04/05/2021 5:22:02 PM By: Deon Pilling RN, BSN ?Signed: 04/05/2021 5:22:02 PM By: Deon Pilling RN, BSN ?Entered  By: Deon Pilling on 04/05/2021 15:31:46 ?-------------------------------------------------------------------------------- ?Wound Assessment Details ?Patient Name: ?Date of Service: ?[name] [name], [name] V ID I. 04/05/2021 2:15

## 2021-04-05 NOTE — Progress Notes (Addendum)
Meacham, Eren I. ([id number]) ?Visit Report for 04/05/2021 ?Biopsy Details ?Patient Name: Date of Service: ?[name], [name] 04/05/2021 2:15 PM ?Medical Record Number: [id number] ?Patient Account Number: [id number] ?Date of Birth/Sex: Treating RN: ?[birth year] (88 y.o. M) [name], [name] ?Primary Care Provider: [name] Other Clinician: ?Referring Provider: ?Treating Provider/Extender: [name] ?Perini, Mark A ?Weeks in Treatment: 28 ?Biopsy Performed for: NonWound Condition Other Dermatologic Condition - Left Leg ?Location(s): ?Other: Other Dermatologic Condition - Left Leg ?Performed By: Physician [name], PA ?Tissue Punch: Yes ?Size (mm): 5 ?Number of Specimens T aken: 1 ?Specimen Sent T Pathology: ?o Yes ?Level of Consciousness (Pre-procedure): Awake and Alert ?Pre-procedure Verification/Time-Out Taken: Yes - 15:30 ?Pain Control: Lidocaine Injectable ?Lidocaine Percent: 1% ?Instrument: Forceps, Scissors ?Bleeding: Large ?Hemostasis Achieved: Silver Nitrate ?Procedural Pain: 0 ?Post Procedural Pain: 0 ?Response to Treatment: Procedure was tolerated well ?Level of Consciousness (Post-procedure): Awake and Alert ?Post Procedure Diagnosis ?Same as Pre-procedure ?Electronic Signature(s) ?Signed: 04/05/2021 5:22:02 PM By: Deon Pilling RN, BSN ?Signed: 04/05/2021 5:59:40 PM By: [name] PA-C ?Entered By: Deon Pilling on 04/05/2021 15:30:48 ?-------------------------------------------------------------------------------- ?Chief Complaint Document Details ?Patient Name: Date of Service: ?[name], [name] 04/05/2021 2:15 PM ?Medical Record Number: [id number] ?Patient Account Number: [id number] ?Date of Birth/Sex: Treating RN: ?1926/02/20 (88 y.o. M) [name], [name] ?Primary Care Provider: [name] Other Clinician: ?Referring Provider: ?Treating Provider/Extender: [name] ?Perini, Mark A ?Weeks in Treatment: 28 ?Information Obtained from: Patient ?Chief Complaint ?Pressure ulcer right heel and left  LE and ankle ulcers. 03/22/21 new sacral pressure ulcer. ?Electronic Signature(s) ?Signed: 04/05/2021 3:09:31 PM By: [name] PA-C ?Entered By: [name] on 04/05/2021 15:09:31 ?-------------------------------------------------------------------------------- ?Debridement Details ?Patient Name: ?Date of Service: ?[name], [name] 04/05/2021 2:15 PM ?Medical Record Number: [id number] ?Patient Account Number: [id number] ?Date of Birth/Sex: ?Treating RN: ?03-12-1926 (88 y.o. M) [name], [name] ?Primary Care Provider: [name] ?Other Clinician: ?Referring Provider: ?Treating Provider/Extender: [name] ?Perini, Mark A ?Weeks in Treatment: 28 ?Debridement Performed for Assessment: Wound #8 Sacrum ?Performed By: Clinician Deon Pilling, RN ?Debridement Type: Chemical/Enzymatic/Mechanical ?Agent Used: gauze and wound cleanser ?Level of Consciousness (Pre-procedure): Awake and Alert ?Pre-procedure Verification/Time Out No ?Taken: ?Bleeding: None ?Response to Treatment: Procedure was tolerated well ?Level of Consciousness (Post- Awake and Alert ?procedure): ?Post Debridement Measurements of Total Wound ?Length: (cm) 3 ?Stage: Category/Stage III ?Width: (cm) 0.4 ?Depth: (cm) 0.1 ?Volume: (cm?) 0.094 ?Character of Wound/Ulcer Post Debridement: Stable ?Post Procedure Diagnosis ?Same as Pre-procedure ?Electronic Signature(s) ?Signed: 04/05/2021 5:22:02 PM By: Deon Pilling RN, BSN ?Signed: 04/05/2021 5:59:40 PM By: [name] PA-C ?Entered By: Deon Pilling on 04/05/2021 16:02:29 ?-------------------------------------------------------------------------------- ?HPI Details ?Patient Name: ?Date of Service: ?[name], [name] 04/05/2021 2:15 PM ?Medical Record Number: [id number] ?Patient Account Number: [id number] ?Date of Birth/Sex: ?Treating RN: ?Sep 02, 1926 (88 y.o. M) [name], [name] ?Primary Care Provider: [name] ?Other Clinician: ?Referring Provider: ?Treating Provider/Extender: [name] ?Perini, Mark  A ?Weeks in Treatment: 28 ?History of Present Illness ?HPI Description: 09/21/2020 upon evaluation today patient appears to be doing somewhat poorly in regard to a wound in the right lateral heel location. This ?fortunately does not appear to be too bad but nonetheless is definitely a spot that we need to work on try to get healed for him. Has been here for quite some ?time. Fortunately there does not appear to be any evidence of active infection at this time which is  great news. With that being said I do believe that he has ?some evidence here of peripheral vascular disease this is minimal his ABI 0.87. Other than that he does have a history of BPH, chronic kidney disease stage ?III, and hypertension. Currently he has been using different medication such as Silvadene or antibiotic ointment to try to help treat things. This just is not ?helping out as efficiently as it should be. ?Of note patient's history includes that of a triple bypass in 2010, a kyphoplasty at L1 which was done 8 months ago, unfortunately he had a fracture 2 months ?ago which she sustained a T7/T8 fracture to his spine which ended up with him being in the hospital. It is in that time since then that he developed this wound ?roughly 2 to 3 months ago is not exactly sure when. He does typically wear compression socks in the 20 to 30 mmHg range. ?09/28/2020 upon evaluation today patient presents for follow-up and overall seems to be doing more poorly based on what I am seeing currently. His ABI last ?time was measured at 0.87 but I am beginning to think this may be falsely elevated due to the fact that there is more eschar and subsequently a concern here ?of vascular compromise that may be part of her issue. I think that we need to avoid any additional sharp debridement and make a referral to vascular surgery ?as soon as possible here. Subsequently also went ahead and did discuss the possibility of starting the patient on Santyl instead of the  collagen in order to help ?clean up the surface of the wound this will take the place of sharp debridement currently. ?10/19/2020 upon evaluation today patient's wound actually appears to be doing okay although he has an area on the medial aspect of his heel that is only been ?showing up for the past couple of days this is more of a blister that has ruptured. The good news is there is no deep wound here which is good. Fortunately ?there is no signs of active infection at this time also good news. With that being said however the blood flow was not good with an ABI of 0.67 on the right it ?was around 1 on the left nonetheless I think he does need to see vascular we put that referral in last week on the 12th Dr. Dellia Nims reviewed that for me and ?made that recommendations. Nonetheless the patient has not heard anything from vascular yet as far as getting this scheduled. I think this needs to be done ?sooner rather than later as I am concerned about the possibility of a limb threatening situation if we do not get this under control.. ?11/16/2020 upon evaluation today patient presents for follow-up regarding issues that he has been having with his bilateral lower extremities in particular. He in ?fact still has an issue with his right heel. This appears to be a stage III pressure ulcer at this point there is some slough noted. Subsequently he does have ?revascularization that is taking place on the right leg as a follow-up later this month in that regard. No repeat ABIs. Subsequently the patient also has venous ?stasis bilaterally which is also problematic for him at this time. He has some ulcers on the left ankle and left leg that are getting need to be addressed as well. ?12/21/2020 upon evaluation today patient appears to be doing poorly in regard to his bilateral lower extremity ulcers. He still has areas on both lower extremities ?currently ineffective  even some areas that were not there previous. Fortunately there  is no signs of active infection at this time. No fevers, chills, nausea, ?vomiting, or diarrhea. ?01/04/21 upon evaluation today patient actually appears to be doing quite well in regard to his wounds. Gilford Rile

## 2021-04-06 ENCOUNTER — Encounter (HOSPITAL_COMMUNITY): Payer: Self-pay | Admitting: Emergency Medicine

## 2021-04-06 ENCOUNTER — Ambulatory Visit (HOSPITAL_COMMUNITY)
Admission: EM | Admit: 2021-04-06 | Discharge: 2021-04-06 | Disposition: A | Discharge status: Home / Self Care | Payer: Medicare Other | Attending: Internal Medicine | Admitting: Internal Medicine

## 2021-04-06 DIAGNOSIS — S51811A Laceration without foreign body of right forearm, initial encounter: Secondary | ICD-10-CM

## 2021-04-06 MED ORDER — SILVER NITRATE-POT NITRATE 75-25 % EX MISC
CUTANEOUS | Status: AC
  Filled 2021-04-06: qty 10

## 2021-04-06 NOTE — ED Provider Notes (Signed)
?[facility] ? ? ? ?CSN: [id number] ?Arrival date & time: 04/06/21  1342 ? ? ?  ? ?History   ?Chief Complaint ?Chief Complaint  ?Patient presents with  ? Arm Injury  ? ? ?HPI ?[name] [name] is a 88 y.o. male comes to the urgent care with bleeding from right forearm which started yesterday after he hit his right arm on the doorway.  Patient is on Xarelto.  Following the incident patient has experienced bleeding from the right upper extremity overnight.  They have had to change the dressing 3 times because of bleeding.  No dizziness, near syncope or syncopal episodes.  No pain in the right upper extremity.  Patient has thin skin and sees the wound clinic for multiple wounds on the extremities.  They were unable to see the patient today..  ? ?HPI ? ?Past Medical History:  ?Diagnosis Date  ? AAA (abdominal aortic aneurysm) ([place])   ? Acute coronary syndrome ([place]) 02/29/2012  ? Acute systolic heart failure ([place]) 03/03/2012  ? Angina   ? Arthritis   ? Ascending aorta dilatation ([place]) 03/13/2012  ? Fusiform dilatation of the ascending thoracic aorta discovered during surgery, max diameter 4.2-4.3 cm  ? Atrial fibrillation ([place]) 02/29/2012  ? Recurrent paroxysmal, new-onset  ? BPH (benign prostatic hypertrophy)   ? CAD (coronary artery disease)   ? Cath April 2012 40% left main, 50% LAD, 40% OM, occluded RCA with L-R collaterals   ? CHF (congestive heart failure) ([place])   ? Chronic venous insufficiency   ? Dyspnea   ? GERD (gastroesophageal reflux disease)   ? Glaucoma   ? Gout   ? Headache   ? MIGRAINES in the past  ? History of kidney stones   ? Hyperlipidemia   ? Hypertension   ? MI (myocardial infarction) ([place])   ? Obesity (BMI 30-39.9)   ? Paroxysmal atrial fibrillation ([place]) 02/29/2012  ? Recurrent paroxysmal, new-onset   ? PMR (polymyalgia rheumatica) ([facility])   ? Polymyalgia rheumatica ([place]) 02/06/2019  ? Pre-diabetes   ? S/P CABG x 3 03/06/2012  ? LIMA to LAD, SVG to OM, SVG to RCA, EVH via right thigh  ? S/P Maze  operation for atrial fibrillation 03/06/2012  ? Complete bilateral lesions set using bipolar radiofrequency and cryothermy ablation with clipping of LA appendage  ? 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  ? s/p TAVR with a 26 mm Edwards via the left subclavian by Drs Buena Irish and Cyndia Bent  ? Severe aortic stenosis 09/29/2019  ? Sleep apnea   ? USES CPAP NIGHTLY  ? ? ?Patient Active Problem List  ? Diagnosis Date Noted  ? Gouty arthritis 02/13/2021  ? Morbid obesity ([place]) 02/13/2021  ? Abdominal aortic aneurysm without rupture ([place]) 11/22/2020  ? Acute renal failure syndrome ([place]) 11/22/2020  ? Low back pain 11/22/2020  ? Skin tear of right upper arm without complication 22/29/7989  ? Pressure injury of skin 10/23/2020  ? Urinary tract infection with hematuria 10/22/2020  ? Encephalopathy 10/22/2020  ? AKI (acute kidney injury) ([place])   ? Overweight (BMI 25.0-29.9) 08/19/2020  ? Vertebral fracture, osteoporotic ([place]) 08/18/2020  ? T8 vertebral fracture ([place]) 08/17/2020  ? Thrombocytopenia ([place]) 08/17/2020  ? CKD (chronic kidney disease) stage 3, GFR 30-59 ml/min ([facility]) 08/17/2020  ? Elevated troponin 08/17/2020  ? Pain due to onychomycosis of toenails of both feet 07/27/2020  ? Callus of heel 07/27/2020  ? Bifascicular block 11/11/2019  ? Acute on chronic diastolic heart failure (  [place]) 11/10/2019  ? 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  ? Severe aortic stenosis 09/29/2019  ? Aortic valve disorder 03/11/2019  ? Systolic heart failure ([place]) 03/11/2019  ? Thrombophilia ([place]) 02/26/2019  ? Muscle pain 02/11/2019  ? Weakness 02/06/2019  ? Polymyalgia rheumatica ([place]) 02/06/2019  ? Shoulder joint pain 02/02/2019  ? Anemia 01/26/2019  ? Malignant melanoma of skin ([place]) 01/26/2019  ? Pain in right leg 01/26/2019  ? DDD (degenerative disc disease), cervical 05/28/2018  ? Encounter for general adult medical examination without abnormal findings 11/19/2017  ? Carotid artery occlusion  07/03/2017  ? Hearing loss 07/03/2017  ? Fall 06/19/2017  ? Injury of upper extremity 06/19/2017  ? Neck pain 06/19/2017  ? Headache 06/19/2017  ? Visual disturbance 06/19/2017  ? Ganglion of hand 05/08/2017  ? Dilatation of aorta ([place]) 02/14/2017  ? Plantar fascial fibromatosis 02/14/2017  ? Cardiac murmur, unspecified 02/27/2016  ? Benign neoplasm of colon 08/24/2015  ? Dementia ([place]) 08/24/2015  ? Influenza due to unidentified influenza virus with other respiratory manifestations 03/15/2015  ? Epistaxis 12/01/2014  ? OSA (obstructive sleep apnea) 11/18/2014  ? Gastro-esophageal reflux disease with esophagitis 10/29/2014  ? Angina pectoris ([place]) 05/04/2014  ? Abnormal feces 07/31/2013  ? Other specified disorders of gingiva and edentulous alveolar ridge 07/31/2013  ? Spontaneous ecchymosis 10/10/2012  ? Thoracic ascending aortic aneurysm ([place]) 09/29/2012  ? Microscopic hematuria 04/11/2012  ? Proteinuria 04/11/2012  ? S/P laparoscopic cholecystectomy 04/09/2012  ? S/P CABG x 3 03/06/2012  ? S/P Maze operation for atrial fibrillation 03/06/2012  ? Paroxysmal atrial fibrillation ([place]) 02/29/2012  ? Bitten or stung by nonvenomous insect and other nonvenomous arthropods, initial encounter 12/03/2011  ? Depression screening 10/17/2011  ? CAD (coronary artery disease), native coronary artery   ? Hyperlipidemia   ? Hypertension   ? Chronic venous insufficiency   ? Gout   ? Benign prostatic hyperplasia   ? Post-traumatic stress disorder 03/13/2011  ? Pain in left leg 12/08/2010  ? 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  ? Fatigue 08/08/2010  ? Retention of urine 04/18/2010  ? Constipation 07/05/2009  ? Kidney stone 07/05/2009  ? ED (erectile dysfunction) of organic origin 12/14/2008  ? Impaired fasting glucose 12/14/2008  ? Vitamin B deficiency 12/14/2008  ? ? ?Past Surgical History:  ?Procedure Laterality Date  ? ABDOMINAL AORTOGRAM W/LOWER EXTREMITY N/A 10/26/2020  ? Procedure: ABDOMINAL AORTOGRAM W/LOWER EXTREMITY;   Surgeon: Broadus John, MD;  Location: [facility];  Service: Cardiovascular;  Laterality: N/A;  ? CARDIAC CATHETERIZATION    ? CATARACT EXTRACTION, BILATERAL    ? with lens implants  ? CHOLECYSTECTOMY N/A 03/24/2012  ? Procedure: LAPAROSCOPIC CHOLECYSTECTOMY WITH INTRAOPERATIVE CHOLANGIOGRAM;  Surgeon: Zenovia Jarred, MD;  Location: [facility];  Service: General;  Laterality: N/A;  ? CORONARY ARTERY BYPASS GRAFT N/A 03/06/2012  ? Procedure: CORONARY ARTERY BYPASS GRAFTING (CABG);  Surgeon: Rexene Alberts, MD;  Location: [facility];  Service: Open Heart Surgery;  Laterality: N/A;  ? ENDOVEIN HARVEST OF GREATER SAPHENOUS VEIN Right 03/06/2012  ? Procedure: ENDOVEIN HARVEST OF GREATER SAPHENOUS VEIN;  Surgeon: Rexene Alberts, MD;  Location: [facility];  Service: Open Heart Surgery;  Laterality: Right;  ? INTRAOPERATIVE TRANSESOPHAGEAL ECHOCARDIOGRAM N/A 03/06/2012  ? Procedure: INTRAOPERATIVE TRANSESOPHAGEAL ECHOCARDIOGRAM;  Surgeon: Rexene Alberts, MD;  Location: [facility];  Service: Open Heart Surgery;  Laterality: N/A;  ? IR KYPHO LUMBAR INC FX REDUCE BONE BX UNI/BIL CANNULATION INC/IMAGING  02/23/2020  ? LEFT HEART CATH  Right 03/02/2012  ? Procedure: LEFT HEART CATH;  Surgeon: Sherren Mocha, MD;  Location: [facility];  Service: Cardiovascular;  Laterality: Right;  ? MAZE N/A 03/06/2012  ? Procedure: MAZE;  Surgeon: Rexene Alberts, MD;  Location: [facility];  Service: Open Heart Surgery;  Laterality: N/A;  ? PERIPHERAL VASCULAR INTERVENTION Right 10/26/2020  ? Procedure: PERIPHERAL VASCULAR INTERVENTION;  Surgeon: Broadus John, MD;  Location: [facility];  Service: Cardiovascular;  Laterality: Right;  ? PROSTATECTOMY    ? partial  ? RIGHT/LEFT HEART CATH AND CORONARY/GRAFT ANGIOGRAPHY N/A 09/29/2019  ? Procedure: RIGHT/LEFT HEART CATH AND CORONARY/GRAFT ANGIOGRAPHY;  Surgeon: Adrian Prows, MD;  Location: [facility];  Service: Cardiovascular;  Laterality: N/A;  ? TEE WITHOUT CARDIOVERSION N/A 11/10/2019  ? Procedure:  TRANSESOPHAGEAL ECHOCARDIOGRAM (TEE);  Surgeon: Burnell Blanks, MD;  Location: [facility];  Service: Open Heart Surgery;  Laterality: N/A;  ? TRANSCATHETER AORTIC VALVE REPLACEMENT, TRANSFEMORAL  11/10/2019  ? ? ? ?

## 2021-04-06 NOTE — ED Triage Notes (Signed)
Pt right arm got hit on doorway causing bleeding to an older healing wound on right forearm. Pt on Xeralto.  ?Family with patient reports that has bleed through 3 different ABD bandages. Reports put clotting stuff on area.  ?

## 2021-04-06 NOTE — Discharge Instructions (Signed)
Please leave the pressure packing in place for 24-48 hours ?Please keep the dressing dry ?Please start dressing changes after 48 hours with topical antibiotic ointment ?Hold Xarelto for 1 day ?If you notice any redness, discharge, persistent bleeding or worsening pain please return to urgent care or go to the emergency department. ?

## 2021-04-07 DIAGNOSIS — I359 Nonrheumatic aortic valve disorder, unspecified: Secondary | ICD-10-CM | POA: Diagnosis not present

## 2021-04-07 DIAGNOSIS — I130 Hypertensive heart and chronic kidney disease with heart failure and stage 1 through stage 4 chronic kidney disease, or unspecified chronic kidney disease: Secondary | ICD-10-CM | POA: Diagnosis not present

## 2021-04-07 DIAGNOSIS — G8929 Other chronic pain: Secondary | ICD-10-CM | POA: Diagnosis not present

## 2021-04-07 DIAGNOSIS — E785 Hyperlipidemia, unspecified: Secondary | ICD-10-CM | POA: Diagnosis not present

## 2021-04-07 DIAGNOSIS — M353 Polymyalgia rheumatica: Secondary | ICD-10-CM | POA: Diagnosis not present

## 2021-04-07 DIAGNOSIS — M8008XD Age-related osteoporosis with current pathological fracture, vertebra(e), subsequent encounter for fracture with routine healing: Secondary | ICD-10-CM | POA: Diagnosis not present

## 2021-04-07 DIAGNOSIS — I5032 Chronic diastolic (congestive) heart failure: Secondary | ICD-10-CM | POA: Diagnosis not present

## 2021-04-07 DIAGNOSIS — G4733 Obstructive sleep apnea (adult) (pediatric): Secondary | ICD-10-CM | POA: Diagnosis not present

## 2021-04-07 DIAGNOSIS — F0390 Unspecified dementia without behavioral disturbance: Secondary | ICD-10-CM | POA: Diagnosis not present

## 2021-04-07 DIAGNOSIS — I480 Paroxysmal atrial fibrillation: Secondary | ICD-10-CM | POA: Diagnosis not present

## 2021-04-07 DIAGNOSIS — N403 Nodular prostate with lower urinary tract symptoms: Secondary | ICD-10-CM | POA: Diagnosis not present

## 2021-04-07 DIAGNOSIS — N1831 Chronic kidney disease, stage 3a: Secondary | ICD-10-CM | POA: Diagnosis not present

## 2021-04-07 DIAGNOSIS — D631 Anemia in chronic kidney disease: Secondary | ICD-10-CM | POA: Diagnosis not present

## 2021-04-07 DIAGNOSIS — I252 Old myocardial infarction: Secondary | ICD-10-CM | POA: Diagnosis not present

## 2021-04-07 DIAGNOSIS — I712 Thoracic aortic aneurysm, without rupture, unspecified: Secondary | ICD-10-CM | POA: Diagnosis not present

## 2021-04-07 DIAGNOSIS — I2510 Atherosclerotic heart disease of native coronary artery without angina pectoris: Secondary | ICD-10-CM | POA: Diagnosis not present

## 2021-04-12 DIAGNOSIS — E291 Testicular hypofunction: Secondary | ICD-10-CM | POA: Diagnosis not present

## 2021-04-12 DIAGNOSIS — M109 Gout, unspecified: Secondary | ICD-10-CM | POA: Diagnosis not present

## 2021-04-12 DIAGNOSIS — E538 Deficiency of other specified B group vitamins: Secondary | ICD-10-CM | POA: Diagnosis not present

## 2021-04-12 DIAGNOSIS — I10 Essential (primary) hypertension: Secondary | ICD-10-CM | POA: Diagnosis not present

## 2021-04-12 DIAGNOSIS — E785 Hyperlipidemia, unspecified: Secondary | ICD-10-CM | POA: Diagnosis not present

## 2021-04-12 DIAGNOSIS — R7301 Impaired fasting glucose: Secondary | ICD-10-CM | POA: Diagnosis not present

## 2021-04-13 ENCOUNTER — Telehealth: Payer: Self-pay | Admitting: Oncology

## 2021-04-13 DIAGNOSIS — I129 Hypertensive chronic kidney disease with stage 1 through stage 4 chronic kidney disease, or unspecified chronic kidney disease: Secondary | ICD-10-CM | POA: Diagnosis not present

## 2021-04-13 DIAGNOSIS — D61818 Other pancytopenia: Secondary | ICD-10-CM | POA: Diagnosis not present

## 2021-04-13 DIAGNOSIS — R82998 Other abnormal findings in urine: Secondary | ICD-10-CM | POA: Diagnosis not present

## 2021-04-13 DIAGNOSIS — D649 Anemia, unspecified: Secondary | ICD-10-CM | POA: Diagnosis not present

## 2021-04-13 DIAGNOSIS — I480 Paroxysmal atrial fibrillation: Secondary | ICD-10-CM | POA: Diagnosis not present

## 2021-04-13 NOTE — Telephone Encounter (Signed)
Scheduled appt per 4/13 referral. Pt is aware of appt date and time. Pt is aware to arrive 15 mins prior to appt time and to bring and updated insurance card. Pt is aware of appt location.   ?

## 2021-04-17 DIAGNOSIS — Z08 Encounter for follow-up examination after completed treatment for malignant neoplasm: Secondary | ICD-10-CM | POA: Diagnosis not present

## 2021-04-17 DIAGNOSIS — Z85828 Personal history of other malignant neoplasm of skin: Secondary | ICD-10-CM | POA: Diagnosis not present

## 2021-04-17 DIAGNOSIS — C44729 Squamous cell carcinoma of skin of left lower limb, including hip: Secondary | ICD-10-CM | POA: Diagnosis not present

## 2021-04-17 DIAGNOSIS — Z85820 Personal history of malignant melanoma of skin: Secondary | ICD-10-CM | POA: Diagnosis not present

## 2021-04-18 DIAGNOSIS — Z0000 Encounter for general adult medical examination without abnormal findings: Secondary | ICD-10-CM | POA: Diagnosis not present

## 2021-04-18 DIAGNOSIS — Z23 Encounter for immunization: Secondary | ICD-10-CM | POA: Diagnosis not present

## 2021-04-18 DIAGNOSIS — E785 Hyperlipidemia, unspecified: Secondary | ICD-10-CM | POA: Diagnosis not present

## 2021-04-18 DIAGNOSIS — D6869 Other thrombophilia: Secondary | ICD-10-CM | POA: Diagnosis not present

## 2021-04-18 DIAGNOSIS — D649 Anemia, unspecified: Secondary | ICD-10-CM | POA: Diagnosis not present

## 2021-04-19 ENCOUNTER — Encounter (HOSPITAL_BASED_OUTPATIENT_CLINIC_OR_DEPARTMENT_OTHER): Payer: Medicare Other | Admitting: Physician Assistant

## 2021-04-19 ENCOUNTER — Other Ambulatory Visit (HOSPITAL_COMMUNITY): Payer: Self-pay | Admitting: *Deleted

## 2021-04-19 DIAGNOSIS — I739 Peripheral vascular disease, unspecified: Secondary | ICD-10-CM | POA: Diagnosis not present

## 2021-04-19 DIAGNOSIS — L97322 Non-pressure chronic ulcer of left ankle with fat layer exposed: Secondary | ICD-10-CM | POA: Diagnosis not present

## 2021-04-19 DIAGNOSIS — L97312 Non-pressure chronic ulcer of right ankle with fat layer exposed: Secondary | ICD-10-CM | POA: Diagnosis not present

## 2021-04-19 DIAGNOSIS — N183 Chronic kidney disease, stage 3 unspecified: Secondary | ICD-10-CM | POA: Diagnosis not present

## 2021-04-19 DIAGNOSIS — N401 Enlarged prostate with lower urinary tract symptoms: Secondary | ICD-10-CM | POA: Diagnosis not present

## 2021-04-19 DIAGNOSIS — L89153 Pressure ulcer of sacral region, stage 3: Secondary | ICD-10-CM | POA: Diagnosis not present

## 2021-04-19 DIAGNOSIS — I872 Venous insufficiency (chronic) (peripheral): Secondary | ICD-10-CM | POA: Diagnosis not present

## 2021-04-19 DIAGNOSIS — L89613 Pressure ulcer of right heel, stage 3: Secondary | ICD-10-CM | POA: Diagnosis not present

## 2021-04-19 DIAGNOSIS — L97822 Non-pressure chronic ulcer of other part of left lower leg with fat layer exposed: Secondary | ICD-10-CM | POA: Diagnosis not present

## 2021-04-19 DIAGNOSIS — I129 Hypertensive chronic kidney disease with stage 1 through stage 4 chronic kidney disease, or unspecified chronic kidney disease: Secondary | ICD-10-CM | POA: Diagnosis not present

## 2021-04-19 NOTE — Progress Notes (Addendum)
[place], Toby I. ([id number]) ?Visit Report for 04/19/2021 ?Chief Complaint Document Details ?Patient Name: Date of Service: ?[name] [name], [name] V ID I. 04/19/2021 1:30 PM ?Medical Record Number: [id number] ?Patient Account Number: [id number] ?Date of Birth/Sex: Treating RN: ?January 19, 1926 (88 y.o. M) [name], [name] ?Primary Care Provider: [name] Other Clinician: ?Referring Provider: ?Treating Provider/Extender: [name] ?Perini, Mark A ?Weeks in Treatment: 30 ?Information Obtained from: Patient ?Chief Complaint ?Pressure ulcer right heel and left LE and ankle ulcers. 03/22/21 new sacral pressure ulcer. ?Electronic Signature(s) ?Signed: 04/19/2021 1:43:29 PM By: [name] PA-C ?Entered By: [name] on 04/19/2021 13:43:29 ?-------------------------------------------------------------------------------- ?HPI Details ?Patient Name: Date of Service: ?[name] [name], [name] V ID I. 04/19/2021 1:30 PM ?Medical Record Number: [id number] ?Patient Account Number: [id number] ?Date of Birth/Sex: Treating RN: ?[birth year] (88 y.o. M) [name], [name] ?Primary Care Provider: [name] Other Clinician: ?Referring Provider: ?Treating Provider/Extender: [name] ?Perini, Mark A ?Weeks in Treatment: 30 ?History of Present Illness ?HPI Description: 09/21/2020 upon evaluation today patient appears to be doing somewhat poorly in regard to a wound in the right lateral heel location. This ?fortunately does not appear to be too bad but nonetheless is definitely a spot that we need to work on try to get healed for him. Has been here for quite some ?time. Fortunately there does not appear to be any evidence of active infection at this time which is great news. With that being said I do believe that he has ?some evidence here of peripheral vascular disease this is minimal his ABI 0.87. Other than that he does have a history of BPH, chronic kidney disease stage ?III, and hypertension. Currently he has been using different medication such as  Silvadene or antibiotic ointment to try to help treat things. This just is not ?helping out as efficiently as it should be. ?Of note patient's history includes that of a triple bypass in 2010, a kyphoplasty at L1 which was done 8 months ago, unfortunately he had a fracture 2 months ?ago which she sustained a T7/T8 fracture to his spine which ended up with him being in the hospital. It is in that time since then that he developed this wound ?roughly 2 to 3 months ago is not exactly sure when. He does typically wear compression socks in the 20 to 30 mmHg range. ?09/28/2020 upon evaluation today patient presents for follow-up and overall seems to be doing more poorly based on what I am seeing currently. His ABI last ?time was measured at 0.87 but I am beginning to think this may be falsely elevated due to the fact that there is more eschar and subsequently a concern here ?of vascular compromise that may be part of her issue. I think that we need to avoid any additional sharp debridement and make a referral to vascular surgery ?as soon as possible here. Subsequently also went ahead and did discuss the possibility of starting the patient on Santyl instead of the collagen in order to help ?clean up the surface of the wound this will take the place of sharp debridement currently. ?10/19/2020 upon evaluation today patient's wound actually appears to be doing okay although he has an area on the medial aspect of his heel that is only been ?showing up for the past couple of days this is more of a blister that has ruptured. The good news is there is no deep wound here which is good. Fortunately ?there is no signs of active infection at this time also  good news. With that being said however the blood flow was not good with an ABI of 0.67 on the right it ?was around 1 on the left nonetheless I think he does need to see vascular we put that referral in last week on the 12th Dr. Dellia Nims reviewed that for me and ?made that  recommendations. Nonetheless the patient has not heard anything from vascular yet as far as getting this scheduled. I think this needs to be done ?sooner rather than later as I am concerned about the possibility of a limb threatening situation if we do not get this under control.. ?11/16/2020 upon evaluation today patient presents for follow-up regarding issues that he has been having with his bilateral lower extremities in particular. He in ?fact still has an issue with his right heel. This appears to be a stage III pressure ulcer at this point there is some slough noted. Subsequently he does have ?revascularization that is taking place on the right leg as a follow-up later this month in that regard. No repeat ABIs. Subsequently the patient also has venous ?stasis bilaterally which is also problematic for him at this time. He has some ulcers on the left ankle and left leg that are getting need to be addressed as well. ?12/21/2020 upon evaluation today patient appears to be doing poorly in regard to his bilateral lower extremity ulcers. He still has areas on both lower extremities ?currently ineffective even some areas that were not there previous. Fortunately there is no signs of active infection at this time. No fevers, chills, nausea, ?vomiting, or diarrhea. ?01/04/21 upon evaluation today patient actually appears to be doing quite well in regard to his wounds. Fortunately there does not appear to be any signs of ?active infection which is great. There is some necrotic tissue that I think would be helpful to debride away pretty much on all wounds other than the heel which ?appears to be doing quite well. ?01/25/2021 upon evaluation today patient appears to be doing okay in regard to his wounds. There are some measurements that are definitely smaller which is ?good news. Fortunately I do not see any evidence of active infection locally nor systemically at this point which is also great news. He does have a  deep ?tissue injury which seems to be getting a bit worse on the lateral portion of his right heel. He tells me is not been using the bunny boots and he has not been ?using a pillow under his calves either. Nonetheless I think this is absolutely a pressure injury and I am very concerned about this. Also I do not want this to get ?a lot worse. He also tells me his hemoglobin is around 7 he is going to be seeing hematology for a transfusion either today or tomorrow most likely this will ?definitely help with his healing. ?02/01/2021 upon evaluation today patient actually appears to be making excellent progress in regard to his wound currently. Fortunately I feel like that all the ?wounds are showing signs of improvement the Iodoflex seems to be doing an awesome job. I am very pleased with where we stand today. There does not ?appear to be any signs of active infection. ?02/22/2021 upon evaluation today patient appears to be doing well with regard to his wound. He has been tolerating the dressing changes without complication. ?Fortunately there does not appear to be any evidence of active infection locally or systemically at this point which is good news. Overall I think that we are ?headed in  the right direction and the Iodoflex is doing a good job. ?03/08/2021 upon evaluation today patient appears to be doing well all things considered with regard to his wounds. He has been tolerating the dressing changes ?without complication. Fortunately there does not appear to be any evidence of active infection locally nor systemically which is great news overall I think the ?Iodosorb is doing an awesome job here. ?03/22/2021 upon evaluation today patient appears to be doing well with regard to his wounds for the most part. The biggest issue is that he does have a new ?wound on his gluteal region really more sacral than anything to be honest. Unfortunately this is going to be something that we need to address sooner rather ?than  later. It does not appear to be too bad right now but obviously things can get significantly worse. ?04-05-2021 upon evaluation today patient actually appears to be doing well in general in regard to his wounds. He does have an area

## 2021-04-19 NOTE — Progress Notes (Signed)
[place], Kevron I. ([id number]) ?Visit Report for 04/19/2021 ?Arrival Information Details ?Patient Name: Date of Service: ?[name] [name], [name] V ID I. 04/19/2021 1:30 PM ?Medical Record Number: [id number] ?Patient Account Number: [id number] ?Date of Birth/Sex: Treating RN: ?[birth year] (88 y.o. M) [name], [name] ?Primary Care Amyah Clawson: [name] Other Clinician: ?Referring Sakari Alkhatib: ?Treating Ninfa Giannelli/Extender: [name] ?Perini, Mark A ?Weeks in Treatment: 30 ?Visit Information History Since Last Visit ?All ordered tests and consults were completed: Yes ?Patient Arrived: Wheel Chair ?Added or deleted any medications: No ?Arrival Time: 13:54 ?Any new allergies or adverse reactions: No ?Accompanied By: wife ?Had a fall or experienced change in No ?Transfer Assistance: Manual ?activities of daily living that may affect ?Patient Identification Verified: Yes ?risk of falls: ?Secondary Verification Process Completed: Yes ?Signs or symptoms of abuse/neglect since last visito No ?Patient Requires Transmission-Based Precautions: No ?Hospitalized since last visit: No ?Patient Has Alerts: Yes ?Implantable device outside of the clinic excluding No ?Patient Alerts: Patient on Blood Thinner cellular tissue based products placed in the center ?since last visit: ?Has Dressing in Place as Prescribed: Yes ?Pain Present Now: No ?Notes ?per patient seen dermatology appt 04/17/2021 and scheduled for 05/10/2021 for surgery at [facility]. Having blood transfusion tomorrow for low ?blood. ?Electronic Signature(s) ?Signed: 04/19/2021 4:48:33 PM By: Deon Pilling RN, BSN ?Entered By: Deon Pilling on 04/19/2021 13:55:39 ?-------------------------------------------------------------------------------- ?Clinic Level of Care Assessment Details ?Patient Name: Date of Service: ?[name] [name], [name] V ID I. 04/19/2021 1:30 PM ?Medical Record Number: [id number] ?Patient Account Number: [id number] ?Date of Birth/Sex: Treating RN: ?12-30-1926 (88 y.o. M) [name],  [name] ?Primary Care Arseniy Toomey: [name] Other Clinician: ?Referring Naome Brigandi: ?Treating Esly Selvage/Extender: [name] ?Perini, Mark A ?Weeks in Treatment: 30 ?Clinic Level of Care Assessment Items ?TOOL 4 Quantity Score ?X- 1 0 ?Use when only an EandM is performed on FOLLOW-UP visit ?ASSESSMENTS - Nursing Assessment / Reassessment ?X- 1 10 ?Reassessment of Co-morbidities (includes updates in patient status) ?X- 1 5 ?Reassessment of Adherence to Treatment Plan ?ASSESSMENTS - Wound and Skin A ssessment / Reassessment ?'[]'$  - 0 ?Simple Wound Assessment / Reassessment - one wound ?X- 5 5 ?Complex Wound Assessment / Reassessment - multiple wounds ?X- 1 10 ?Dermatologic / Skin Assessment (not related to wound area) ?ASSESSMENTS - Focused Assessment ?X- 2 5 ?Circumferential Edema Measurements - multi extremities ?X- 1 10 ?Nutritional Assessment / Counseling / Intervention ?'[]'$  - 0 ?Lower Extremity Assessment (monofilament, tuning fork, pulses) ?'[]'$  - 0 ?Peripheral Arterial Disease Assessment (using hand held doppler) ?ASSESSMENTS - Ostomy and/or Continence Assessment and Care ?'[]'$  - 0 ?Incontinence Assessment and Management ?'[]'$  - 0 ?Ostomy Care Assessment and Management (repouching, etc.) ?PROCESS - Coordination of Care ?'[]'$  - 0 ?Simple Patient / Family Education for ongoing care ?X- 1 20 ?Complex (extensive) Patient / Family Education for ongoing care ?X- 1 10 ?Staff obtains Consents, Records, T Results / Process Orders ?est ?'[]'$  - 0 ?Staff telephones HHA, Nursing Homes / Clarify orders / etc ?'[]'$  - 0 ?Routine Transfer to another Facility (non-emergent condition) ?'[]'$  - 0 ?Routine Hospital Admission (non-emergent condition) ?'[]'$  - 0 ?New Admissions / Biomedical engineer / Ordering NPWT Apligraf, etc. ?, ?'[]'$  - 0 ?Emergency Hospital Admission (emergent condition) ?'[]'$  - 0 ?Simple Discharge Coordination ?X- 1 15 ?Complex (extensive) Discharge Coordination ?PROCESS - Special Needs ?'[]'$  - 0 ?Pediatric / Minor Patient  Management ?'[]'$  - 0 ?Isolation Patient Management ?'[]'$  - 0 ?Hearing / Language / Visual special needs ?'[]'$  - 0 ?Assessment of Community assistance (  transportation, D/C planning, etc.) ?'[]'$  - 0 ?Additional assistance / Altered mentation ?'[]'$  - 0 ?Support Surface(s) Assessment (bed, cushion, seat, etc.) ?INTERVENTIONS - Wound Cleansing / Measurement ?'[]'$  - 0 ?Simple Wound Cleansing - one wound ?X- 5 5 ?Complex Wound Cleansing - multiple wounds ?X- 1 5 ?Wound Imaging (photographs - any number of wounds) ?'[]'$  - 0 ?Wound Tracing (instead of photographs) ?'[]'$  - 0 ?Simple Wound Measurement - one wound ?X- 5 5 ?Complex Wound Measurement - multiple wounds ?INTERVENTIONS - Wound Dressings ?X - Small Wound Dressing one or multiple wounds 4 10 ?'[]'$  - 0 ?Medium Wound Dressing one or multiple wounds ?'[]'$  - 0 ?Large Wound Dressing one or multiple wounds ?'[]'$  - 0 ?Application of Medications - topical ?'[]'$  - 0 ?Application of Medications - injection ?INTERVENTIONS - Miscellaneous ?'[]'$  - 0 ?External ear exam ?'[]'$  - 0 ?Specimen Collection (cultures, biopsies, blood, body fluids, etc.) ?'[]'$  - 0 ?Specimen(s) / Culture(s) sent or taken to Lab for analysis ?'[]'$  - 0 ?Patient Transfer (multiple staff / Civil Service fast streamer / Similar devices) ?'[]'$  - 0 ?Simple Staple / Suture removal (25 or less) ?'[]'$  - 0 ?Complex Staple / Suture removal (26 or more) ?'[]'$  - 0 ?Hypo / Hyperglycemic Management (close monitor of Blood Glucose) ?'[]'$  - 0 ?Ankle / Brachial Index (ABI) - do not check if billed separately ?X- 1 5 ?Vital Signs ?Has the patient been seen at the hospital within the last three years: Yes ?Total Score: 215 ?Level Of Care: New/Established - Level 5 ?Electronic Signature(s) ?Signed: 04/19/2021 4:48:33 PM By: Deon Pilling RN, BSN ?Entered By: Deon Pilling on 04/19/2021 15:44:08 ?-------------------------------------------------------------------------------- ?Encounter Discharge Information Details ?Patient Name: Date of Service: ?[name] [name], [name] V ID I. 04/19/2021 1:30  PM ?Medical Record Number: [id number] ?Patient Account Number: [id number] ?Date of Birth/Sex: Treating RN: ?[birth year] (88 y.o. M) [name], [name] ?Primary Care Yarimar Lavis: [name] Other Clinician: ?Referring Rilya Longo: ?Treating Janifer Gieselman/Extender: [name] ?Perini, Mark A ?Weeks in Treatment: 30 ?Encounter Discharge Information Items ?Discharge Condition: Stable ?Ambulatory Status: Wheelchair ?Discharge Destination: Home ?Transportation: Private Auto ?Accompanied By: wife ?Schedule Follow-up Appointment: Yes ?Clinical Summary of Care: ?Electronic Signature(s) ?Signed: 04/19/2021 4:48:33 PM By: Deon Pilling RN, BSN ?Entered By: Deon Pilling on 04/19/2021 15:00:52 ?-------------------------------------------------------------------------------- ?Lower Extremity Assessment Details ?Patient Name: Date of Service: ?[name] [name], [name] V ID I. 04/19/2021 1:30 PM ?Medical Record Number: [id number] ?Patient Account Number: [id number] ?Date of Birth/Sex: Treating RN: ?07-Apr-1926 (88 y.o. M) [name], [name] ?Primary Care Shenequa Howse: [name] Other Clinician: ?Referring Kharisma Glasner: ?Treating Bubba Vanbenschoten/Extender: [name] ?Perini, Mark A ?Weeks in Treatment: 30 ?Edema Assessment ?Assessed: [Left: Yes] [Right: Yes] ?Edema: [Left: Yes] [Right: Yes] ?Calf ?Left: Right: ?Point of Measurement: 34 cm From Medial Instep 44 cm 44.5 cm ?Ankle ?Left: Right: ?Point of Measurement: 11 cm From Medial Instep 27 cm 30 cm ?Electronic Signature(s) ?Signed: 04/19/2021 4:48:33 PM By: Deon Pilling RN, BSN ?Entered By: Deon Pilling on 04/19/2021 14:12:00 ?-------------------------------------------------------------------------------- ?Multi-Disciplinary Care Plan Details ?Patient Name: ?Date of Service: ?[name] [name], [name] V ID I. 04/19/2021 1:30 PM ?Medical Record Number: [id number] ?Patient Account Number: [id number] ?Date of Birth/Sex: ?Treating RN: ?December 26, 1926 (88 y.o. M) [name], [name] ?Primary Care Mason Burleigh: [name] ?Other Clinician: ?Referring  Imer Foxworth: ?Treating Jeniyah Menor/Extender: [name] ?Perini, Mark A ?Weeks in Treatment: 30 ?Active Inactive ?Abuse / Safety / Falls / Self Care Management ?Nursing Diagnoses: ?Potential for falls ?Goals: ?Patient wil

## 2021-04-20 ENCOUNTER — Ambulatory Visit (HOSPITAL_COMMUNITY)
Admission: RE | Admit: 2021-04-20 | Discharge: 2021-04-20 | Disposition: A | Discharge status: Home / Self Care | Payer: Medicare Other | Source: Ambulatory Visit | Attending: Internal Medicine | Admitting: Internal Medicine

## 2021-04-20 DIAGNOSIS — L97822 Non-pressure chronic ulcer of other part of left lower leg with fat layer exposed: Secondary | ICD-10-CM | POA: Diagnosis not present

## 2021-04-20 DIAGNOSIS — L89623 Pressure ulcer of left heel, stage 3: Secondary | ICD-10-CM | POA: Diagnosis not present

## 2021-04-20 DIAGNOSIS — S91309A Unspecified open wound, unspecified foot, initial encounter: Secondary | ICD-10-CM | POA: Diagnosis not present

## 2021-04-20 DIAGNOSIS — D649 Anemia, unspecified: Secondary | ICD-10-CM | POA: Insufficient documentation

## 2021-04-20 DIAGNOSIS — L89613 Pressure ulcer of right heel, stage 3: Secondary | ICD-10-CM | POA: Diagnosis not present

## 2021-04-20 LAB — PREPARE RBC (CROSSMATCH)

## 2021-04-20 MED ORDER — FUROSEMIDE 10 MG/ML IJ SOLN
INTRAMUSCULAR | Status: AC
  Administered 2021-04-20: 20 mg via INTRAVENOUS
  Filled 2021-04-20: qty 2

## 2021-04-20 MED ORDER — ACETAMINOPHEN 325 MG PO TABS
ORAL_TABLET | ORAL | Status: AC
  Administered 2021-04-20: 650 mg via ORAL
  Filled 2021-04-20: qty 2

## 2021-04-20 MED ORDER — ACETAMINOPHEN 325 MG PO TABS
ORAL_TABLET | ORAL | Status: AC
  Filled 2021-04-20: qty 2

## 2021-04-20 MED ORDER — FUROSEMIDE 10 MG/ML IJ SOLN: 20.0000 mg | Freq: Once | INTRAMUSCULAR | Status: AC

## 2021-04-20 MED ORDER — SODIUM CHLORIDE 0.9% IV SOLUTION: Freq: Once | INTRAVENOUS | Status: DC

## 2021-04-20 MED ORDER — ACETAMINOPHEN 325 MG PO TABS
650.0000 mg | ORAL_TABLET | Freq: Two times a day (BID) | ORAL | Status: AC
  Administered 2021-04-20: 650 mg via ORAL

## 2021-04-21 DIAGNOSIS — N390 Urinary tract infection, site not specified: Secondary | ICD-10-CM | POA: Diagnosis not present

## 2021-04-21 LAB — TYPE AND SCREEN
ABO/RH(D): O POS
Antibody Screen: NEGATIVE
Unit division: 0
Unit division: 0

## 2021-04-21 LAB — BPAM RBC
Blood Product Expiration Date: 202305252359
Blood Product Expiration Date: 202305262359
ISSUE DATE / TIME: 202304200921
ISSUE DATE / TIME: 202304201241
Unit Type and Rh: 5100
Unit Type and Rh: 5100

## 2021-04-24 DIAGNOSIS — M0600 Rheumatoid arthritis without rheumatoid factor, unspecified site: Secondary | ICD-10-CM | POA: Diagnosis not present

## 2021-04-24 DIAGNOSIS — M353 Polymyalgia rheumatica: Secondary | ICD-10-CM | POA: Diagnosis not present

## 2021-04-24 DIAGNOSIS — D696 Thrombocytopenia, unspecified: Secondary | ICD-10-CM | POA: Diagnosis not present

## 2021-04-24 DIAGNOSIS — M109 Gout, unspecified: Secondary | ICD-10-CM | POA: Diagnosis not present

## 2021-04-26 DIAGNOSIS — G4733 Obstructive sleep apnea (adult) (pediatric): Secondary | ICD-10-CM | POA: Diagnosis not present

## 2021-04-26 DIAGNOSIS — I5021 Acute systolic (congestive) heart failure: Secondary | ICD-10-CM | POA: Diagnosis not present

## 2021-04-26 DIAGNOSIS — I4891 Unspecified atrial fibrillation: Secondary | ICD-10-CM | POA: Diagnosis not present

## 2021-04-26 DIAGNOSIS — I259 Chronic ischemic heart disease, unspecified: Secondary | ICD-10-CM | POA: Diagnosis not present

## 2021-04-28 ENCOUNTER — Inpatient Hospital Stay: Payer: Medicare Other

## 2021-04-28 ENCOUNTER — Other Ambulatory Visit: Payer: Self-pay

## 2021-04-28 ENCOUNTER — Inpatient Hospital Stay: Payer: Medicare Other | Attending: Oncology | Admitting: Oncology

## 2021-04-28 VITALS — BP 115/52 | HR 56 | Temp 97.3°F | Resp 16 | Wt 247.2 lb

## 2021-04-28 DIAGNOSIS — D631 Anemia in chronic kidney disease: Secondary | ICD-10-CM | POA: Insufficient documentation

## 2021-04-28 DIAGNOSIS — D72819 Decreased white blood cell count, unspecified: Secondary | ICD-10-CM | POA: Diagnosis not present

## 2021-04-28 DIAGNOSIS — D649 Anemia, unspecified: Secondary | ICD-10-CM | POA: Diagnosis not present

## 2021-04-28 DIAGNOSIS — D696 Thrombocytopenia, unspecified: Secondary | ICD-10-CM | POA: Diagnosis not present

## 2021-04-28 DIAGNOSIS — Z79899 Other long term (current) drug therapy: Secondary | ICD-10-CM | POA: Diagnosis not present

## 2021-04-28 DIAGNOSIS — I130 Hypertensive heart and chronic kidney disease with heart failure and stage 1 through stage 4 chronic kidney disease, or unspecified chronic kidney disease: Secondary | ICD-10-CM | POA: Diagnosis not present

## 2021-04-28 LAB — CMP (CANCER CENTER ONLY)
ALT: 13 U/L (ref 0–44)
AST: 19 U/L (ref 15–41)
Albumin: 3.9 g/dL (ref 3.5–5.0)
Alkaline Phosphatase: 67 U/L (ref 38–126)
Anion gap: 7 (ref 5–15)
BUN: 37 mg/dL — ABNORMAL HIGH (ref 8–23)
CO2: 30 mmol/L (ref 22–32)
Calcium: 9.7 mg/dL (ref 8.9–10.3)
Chloride: 99 mmol/L (ref 98–111)
Creatinine: 1.95 mg/dL — ABNORMAL HIGH (ref 0.61–1.24)
GFR, Estimated: 32 mL/min — ABNORMAL LOW (ref >60)
Glucose, Bld: 100 mg/dL — ABNORMAL HIGH (ref 70–99)
Potassium: 3.6 mmol/L (ref 3.5–5.1)
Sodium: 136 mmol/L (ref 135–145)
Total Bilirubin: 0.3 mg/dL (ref 0.3–1.2)
Total Protein: 6.9 g/dL (ref 6.5–8.1)

## 2021-04-28 LAB — CBC WITH DIFFERENTIAL (CANCER CENTER ONLY)
Abs Immature Granulocytes: 0.02 10*3/uL (ref 0.00–0.07)
Basophils Absolute: 0.1 10*3/uL (ref 0.0–0.1)
Basophils Relative: 1 %
Eosinophils Absolute: 0.1 10*3/uL (ref 0.0–0.5)
Eosinophils Relative: 2 %
HCT: 29.9 % — ABNORMAL LOW (ref 39.0–52.0)
Hemoglobin: 9.2 g/dL — ABNORMAL LOW (ref 13.0–17.0)
Immature Granulocytes: 1 %
Lymphocytes Relative: 21 %
Lymphs Abs: 0.9 10*3/uL (ref 0.7–4.0)
MCH: 27.9 pg (ref 26.0–34.0)
MCHC: 30.8 g/dL (ref 30.0–36.0)
MCV: 90.6 fL (ref 80.0–100.0)
Monocytes Absolute: 0.5 10*3/uL (ref 0.1–1.0)
Monocytes Relative: 11 %
Neutro Abs: 2.8 10*3/uL (ref 1.7–7.7)
Neutrophils Relative %: 64 %
Platelet Count: 120 10*3/uL — ABNORMAL LOW (ref 150–400)
RBC: 3.3 MIL/uL — ABNORMAL LOW (ref 4.22–5.81)
RDW: 18.6 % — ABNORMAL HIGH (ref 11.5–15.5)
Smear Review: NORMAL
WBC Count: 4.3 10*3/uL (ref 4.0–10.5)
nRBC: 0 % (ref 0.0–0.2)

## 2021-04-28 LAB — IRON AND IRON BINDING CAPACITY (CC-WL,HP ONLY)
Iron: 87 ug/dL (ref 45–182)
Saturation Ratios: 24 % (ref 17.9–39.5)
TIBC: 367 ug/dL (ref 250–450)
UIBC: 280 ug/dL (ref 117–376)

## 2021-04-28 LAB — VITAMIN B12: Vitamin B-12: 683 pg/mL (ref 180–914)

## 2021-04-28 LAB — FERRITIN: Ferritin: 99 ng/mL (ref 24–336)

## 2021-04-28 NOTE — Progress Notes (Signed)
?Reason for the request:    Anemia ? ?HPI: I was asked by Dr. Joylene Draft to evaluate [name] [name] for the evaluation of anemia.  He is a 88 year old man with history of atrial fibrillation, coronary disease as well as chronic kidney disease with recurrent anemia dating back to 2014 with fluctuating counts most recently ranging between 8 and 10 since 2021.  His hemoglobin was 9.5 on March 14, 2021 with a white cell count of 6.8 MCV of 92 and a platelet count of 92.  His RDW was 18.5.  Iron studies obtained in October 2023 showed a ferritin of 92 as well as iron level of 13 and saturation of 4%.  He has received packed red cell transfusion on few occasions in the last year.  Laboratory data obtained on April 13, 2021 showed a white cell count of 5.1, hemoglobin of 7.5 and MCV of 89.  His RDW is 15.9 with a platelet count of 88.  His creatinine is at 2.0 with a creatinine clearance of 31 cc/min.  I did receive packed red cell transfusion on April 20 with improvement in his fatigue and clinical status. ? ?Clinically, he reports no major complaints at this time.  He is limited in his mobility because of lower extremity edema ulcers.  He denies any hematochezia but did have episode of hematuria and was diagnosed with a urinary tract infection. ? ?He does not report any headaches, blurry vision, syncope or seizures. Does not report any fevers, chills or sweats.  Does not report any cough, wheezing or hemoptysis.  Does not report any chest pain, palpitation, orthopnea or leg edema.  Does not report any nausea, vomiting or abdominal pain.  Does not report any constipation or diarrhea.  Does not report any skeletal complaints.    Does not report frequency, urgency or hematuria.  Does not report any skin rashes or lesions. Does not report any heat or cold intolerance.  Does not report any lymphadenopathy or petechiae.  Does not report any anxiety or depression.  Remaining review of systems is negative.  ? ? ? ?Past Medical History:   ?Diagnosis Date  ? AAA (abdominal aortic aneurysm) ([place])   ? Acute coronary syndrome ([place]) 02/29/2012  ? Acute systolic heart failure ([place]) 03/03/2012  ? Angina   ? Arthritis   ? Ascending aorta dilatation ([place]) 03/13/2012  ? Fusiform dilatation of the ascending thoracic aorta discovered during surgery, max diameter 4.2-4.3 cm  ? Atrial fibrillation ([place]) 02/29/2012  ? Recurrent paroxysmal, new-onset  ? BPH (benign prostatic hypertrophy)   ? CAD (coronary artery disease)   ? Cath April 2012 40% left main, 50% LAD, 40% OM, occluded RCA with L-R collaterals   ? CHF (congestive heart failure) ([place])   ? Chronic venous insufficiency   ? Dyspnea   ? GERD (gastroesophageal reflux disease)   ? Glaucoma   ? Gout   ? Headache   ? MIGRAINES in the past  ? History of kidney stones   ? Hyperlipidemia   ? Hypertension   ? MI (myocardial infarction) ([place])   ? Obesity (BMI 30-39.9)   ? Paroxysmal atrial fibrillation ([place]) 02/29/2012  ? Recurrent paroxysmal, new-onset   ? PMR (polymyalgia rheumatica) ([facility])   ? Polymyalgia rheumatica ([place]) 02/06/2019  ? Pre-diabetes   ? S/P CABG x 3 03/06/2012  ? LIMA to LAD, SVG to OM, SVG to RCA, EVH via right thigh  ? S/P Maze operation for atrial fibrillation 03/06/2012  ? Complete bilateral lesions set  using bipolar radiofrequency and cryothermy ablation with clipping of LA appendage  ? 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  ? s/p TAVR with a 26 mm Edwards via the left subclavian by Drs Buena Irish and Cyndia Bent  ? Severe aortic stenosis 09/29/2019  ? Sleep apnea   ? USES CPAP NIGHTLY  ?: ? ? ?Past Surgical History:  ?Procedure Laterality Date  ? ABDOMINAL AORTOGRAM W/LOWER EXTREMITY N/A 10/26/2020  ? Procedure: ABDOMINAL AORTOGRAM W/LOWER EXTREMITY;  Surgeon: Broadus John, MD;  Location: [facility];  Service: Cardiovascular;  Laterality: N/A;  ? CARDIAC CATHETERIZATION    ? CATARACT EXTRACTION, BILATERAL    ? with lens implants  ? CHOLECYSTECTOMY N/A 03/24/2012  ? Procedure:  LAPAROSCOPIC CHOLECYSTECTOMY WITH INTRAOPERATIVE CHOLANGIOGRAM;  Surgeon: Zenovia Jarred, MD;  Location: [facility];  Service: General;  Laterality: N/A;  ? CORONARY ARTERY BYPASS GRAFT N/A 03/06/2012  ? Procedure: CORONARY ARTERY BYPASS GRAFTING (CABG);  Surgeon: Rexene Alberts, MD;  Location: [facility];  Service: Open Heart Surgery;  Laterality: N/A;  ? ENDOVEIN HARVEST OF GREATER SAPHENOUS VEIN Right 03/06/2012  ? Procedure: ENDOVEIN HARVEST OF GREATER SAPHENOUS VEIN;  Surgeon: Rexene Alberts, MD;  Location: [facility];  Service: Open Heart Surgery;  Laterality: Right;  ? INTRAOPERATIVE TRANSESOPHAGEAL ECHOCARDIOGRAM N/A 03/06/2012  ? Procedure: INTRAOPERATIVE TRANSESOPHAGEAL ECHOCARDIOGRAM;  Surgeon: Rexene Alberts, MD;  Location: [facility];  Service: Open Heart Surgery;  Laterality: N/A;  ? IR KYPHO LUMBAR INC FX REDUCE BONE BX UNI/BIL CANNULATION INC/IMAGING  02/23/2020  ? LEFT HEART CATH Right 03/02/2012  ? Procedure: LEFT HEART CATH;  Surgeon: Sherren Mocha, MD;  Location: [facility];  Service: Cardiovascular;  Laterality: Right;  ? MAZE N/A 03/06/2012  ? Procedure: MAZE;  Surgeon: Rexene Alberts, MD;  Location: [facility];  Service: Open Heart Surgery;  Laterality: N/A;  ? PERIPHERAL VASCULAR INTERVENTION Right 10/26/2020  ? Procedure: PERIPHERAL VASCULAR INTERVENTION;  Surgeon: Broadus John, MD;  Location: [facility];  Service: Cardiovascular;  Laterality: Right;  ? PROSTATECTOMY    ? partial  ? RIGHT/LEFT HEART CATH AND CORONARY/GRAFT ANGIOGRAPHY N/A 09/29/2019  ? Procedure: RIGHT/LEFT HEART CATH AND CORONARY/GRAFT ANGIOGRAPHY;  Surgeon: Adrian Prows, MD;  Location: [facility];  Service: Cardiovascular;  Laterality: N/A;  ? TEE WITHOUT CARDIOVERSION N/A 11/10/2019  ? Procedure: TRANSESOPHAGEAL ECHOCARDIOGRAM (TEE);  Surgeon: Burnell Blanks, MD;  Location: [facility];  Service: Open Heart Surgery;  Laterality: N/A;  ? TRANSCATHETER AORTIC VALVE REPLACEMENT, TRANSFEMORAL  11/10/2019  ?: ? ? ?Current Outpatient  Medications:  ?  acetaminophen (TYLENOL) 500 MG tablet, Take 2 tablets (1,000 mg total) by mouth 3 (three) times daily. For 3 to 5 days and then change to 1000 mg 3 times daily as needed for pain. (Patient taking differently: Take 500 mg by mouth as needed for mild pain.), Disp: , Rfl:  ?  amiodarone (PACERONE) 200 MG tablet, Take 1 tablet (200 mg total) by mouth daily., Disp: 90 tablet, Rfl: 3 ?  amLODipine (NORVASC) 5 MG tablet, Take 5 mg by mouth daily., Disp: , Rfl:  ?  aspirin EC 81 MG EC tablet, Take 1 tablet (81 mg total) by mouth daily. Swallow whole., Disp: 30 tablet, Rfl: 11 ?  atorvastatin (LIPITOR) 40 MG tablet, Take 1 tablet (40 mg total) by mouth daily., Disp: 90 tablet, Rfl: 1 ?  cadexomer iodine (IODOSORB) 0.9 % gel, Apply 1 application. topically every other day as needed for wound care.,  Disp: , Rfl:  ?  cholecalciferol (VITAMIN D3) 25 MCG (1000 UNIT) tablet, Take 1,000 Units by mouth daily. , Disp: , Rfl:  ?  Cyanocobalamin (VITAMIN B-12) 5000 MCG TBDP, Take 5,000 mcg by mouth daily., Disp: , Rfl:  ?  denosumab (PROLIA) 60 MG/ML SOSY injection, Inject 60 mg into the skin every 6 (six) months., Disp: , Rfl:  ?  docusate sodium (COLACE) 100 MG capsule, Take 1 capsule (100 mg total) by mouth 2 (two) times daily. (Patient taking differently: Take 100 mg by mouth every evening.), Disp: , Rfl:  ?  EPINEPHrine 0.3 mg/0.3 mL IJ SOAJ injection, Inject 0.3 mLs (0.3 mg total) into the muscle as needed for anaphylaxis. (Patient taking differently: Inject 0.3 mg into the muscle once as needed for anaphylaxis.), Disp: 2 each, Rfl: 2 ?  ezetimibe (ZETIA) 10 MG tablet, Take 10 mg by mouth daily., Disp: , Rfl:  ?  finasteride (PROSCAR) 5 MG tablet, Take 5 mg by mouth daily., Disp: , Rfl:  ?  fish oil-omega-3 fatty acids 1000 MG capsule, Take 1 g by mouth daily. , Disp: , Rfl:  ?  fluticasone (FLONASE) 50 MCG/ACT nasal spray, Place 1 spray into both nostrils daily as needed for allergies., Disp: , Rfl:  ?   gabapentin (NEURONTIN) 100 MG capsule, Take 100-200 mg by mouth See admin instructions. '200mg'$  in the morning, '100mg'$  in the evening, and '200mg'$  at bedtime, Disp: , Rfl:  ?  galantamine (RAZADYNE ER) 8 MG 24 hr capsu

## 2021-04-29 LAB — ERYTHROPOIETIN: Erythropoietin: 29.7 m[IU]/mL — ABNORMAL HIGH (ref 2.6–18.5)

## 2021-05-01 ENCOUNTER — Other Ambulatory Visit: Payer: Self-pay | Admitting: Oncology

## 2021-05-01 DIAGNOSIS — D649 Anemia, unspecified: Secondary | ICD-10-CM

## 2021-05-01 NOTE — Progress Notes (Signed)
The results of laboratory testing on April 28 were personally reviewed and discussed with the patient via phone today.  Iron stores appear intact at this time although his kidney function appears to be worse with creatinine clearance about 30 cc/min.  Based on these findings have recommended a trial of growth factor support.  His erythropoietin is not at appropriate level. ? ?Risks and benefits of Retacrit injection were discussed today.  Complications were reiterated including thromboembolism and hypertension.  He is agreeable to proceed in the near future. ?

## 2021-05-02 ENCOUNTER — Telehealth: Payer: Self-pay | Admitting: Oncology

## 2021-05-02 NOTE — Telephone Encounter (Signed)
.  Called pt per 5/1 inbasket , Patient was unavailable, a message with appt time and date was left with number on file.   ?

## 2021-05-03 ENCOUNTER — Encounter (HOSPITAL_BASED_OUTPATIENT_CLINIC_OR_DEPARTMENT_OTHER): Payer: Medicare Other | Attending: Physician Assistant | Admitting: Physician Assistant

## 2021-05-03 DIAGNOSIS — I129 Hypertensive chronic kidney disease with stage 1 through stage 4 chronic kidney disease, or unspecified chronic kidney disease: Secondary | ICD-10-CM | POA: Diagnosis not present

## 2021-05-03 DIAGNOSIS — L97312 Non-pressure chronic ulcer of right ankle with fat layer exposed: Secondary | ICD-10-CM | POA: Insufficient documentation

## 2021-05-03 DIAGNOSIS — N183 Chronic kidney disease, stage 3 unspecified: Secondary | ICD-10-CM | POA: Diagnosis not present

## 2021-05-03 DIAGNOSIS — I872 Venous insufficiency (chronic) (peripheral): Secondary | ICD-10-CM | POA: Insufficient documentation

## 2021-05-03 DIAGNOSIS — L89153 Pressure ulcer of sacral region, stage 3: Secondary | ICD-10-CM | POA: Diagnosis not present

## 2021-05-03 DIAGNOSIS — L97822 Non-pressure chronic ulcer of other part of left lower leg with fat layer exposed: Secondary | ICD-10-CM | POA: Insufficient documentation

## 2021-05-03 DIAGNOSIS — S71111A Laceration without foreign body, right thigh, initial encounter: Secondary | ICD-10-CM | POA: Insufficient documentation

## 2021-05-03 DIAGNOSIS — L988 Other specified disorders of the skin and subcutaneous tissue: Secondary | ICD-10-CM | POA: Insufficient documentation

## 2021-05-03 DIAGNOSIS — L89613 Pressure ulcer of right heel, stage 3: Secondary | ICD-10-CM | POA: Insufficient documentation

## 2021-05-03 DIAGNOSIS — I7389 Other specified peripheral vascular diseases: Secondary | ICD-10-CM | POA: Insufficient documentation

## 2021-05-03 DIAGNOSIS — N401 Enlarged prostate with lower urinary tract symptoms: Secondary | ICD-10-CM | POA: Insufficient documentation

## 2021-05-03 DIAGNOSIS — S41111A Laceration without foreign body of right upper arm, initial encounter: Secondary | ICD-10-CM | POA: Insufficient documentation

## 2021-05-03 LAB — MULTIPLE MYELOMA PANEL, SERUM
Albumin SerPl Elph-Mcnc: 3.6 g/dL (ref 2.9–4.4)
Albumin/Glob SerPl: 1.3 (ref 0.7–1.7)
Alpha 1: 0.3 g/dL (ref 0.0–0.4)
Alpha2 Glob SerPl Elph-Mcnc: 0.9 g/dL (ref 0.4–1.0)
B-Globulin SerPl Elph-Mcnc: 1.1 g/dL (ref 0.7–1.3)
Gamma Glob SerPl Elph-Mcnc: 0.7 g/dL (ref 0.4–1.8)
Globulin, Total: 2.9 g/dL (ref 2.2–3.9)
IgA: 131 mg/dL (ref 61–437)
IgG (Immunoglobin G), Serum: 810 mg/dL (ref 603–1613)
IgM (Immunoglobulin M), Srm: 30 mg/dL (ref 15–143)
Total Protein ELP: 6.5 g/dL (ref 6.0–8.5)

## 2021-05-03 NOTE — Progress Notes (Addendum)
Andrews, Levern I. ([id number]) ?Visit Report for 05/03/2021 ?Chief Complaint Document Details ?Patient Name: Date of Service: ?[name], [name] 05/03/2021 2:15 PM ?Medical Record Number: [id number] ?Patient Account Number: [id number] ?Date of Birth/Sex: Treating RN: ?1926/03/21 (88 y.o. M) [name], [name] ?Primary Care Provider: [name] Other Clinician: ?Referring Provider: ?Treating Provider/Extender: [name] ?Perini, Mark A ?Weeks in Treatment: 32 ?Information Obtained from: Patient ?Chief Complaint ?Multiple ulcers noted over the upper and lower extremities ?Electronic Signature(s) ?Signed: 05/03/2021 3:30:26 PM By: [name] PA-C ?Previous Signature: 05/03/2021 2:07:40 PM Version By: [name] PA-C ?Entered By: [name] on 05/03/2021 15:30:25 ?-------------------------------------------------------------------------------- ?Debridement Details ?Patient Name: Date of Service: ?[name], [name] 05/03/2021 2:15 PM ?Medical Record Number: [id number] ?Patient Account Number: [id number] ?Date of Birth/Sex: Treating RN: ?1926/10/17 (88 y.o. [name] ?Primary Care Provider: [name] Other Clinician: ?Referring Provider: ?Treating Provider/Extender: [name] ?Perini, Mark A ?Weeks in Treatment: 32 ?Debridement Performed for Assessment: Wound #11 Right,Posterior Upper Arm ?Performed By: Physician [name], PA ?Debridement Type: Chemical/Enzymatic/Mechanical ?Agent Used: gauze ?Level of Consciousness (Pre-procedure): Awake and Alert ?Pre-procedure Verification/Time Out Yes - 15:25 ?Taken: ?Start Time: 15:25 ?Pain Control: ?Other : benzocaine 20%spray ?Instrument: ?Other : gauze ?Bleeding: Minimum ?Hemostasis Achieved: Pressure ?Procedural Pain: 0 ?Post Procedural Pain: 0 ?Response to Treatment: Procedure was tolerated well ?Level of Consciousness (Post- Awake and Alert ?procedure): ?Post Debridement Measurements of Total Wound ?Length: (cm) 3 ?Width: (cm) 0.5 ?Depth: (cm)  0.1 ?Volume: (cm?) 0.118 ?Character of Wound/Ulcer Post Debridement: Improved ?Post Procedure Diagnosis ?Same as Pre-procedure ?Electronic Signature(s) ?Signed: 05/05/2021 1:25:50 PM By: [name] PA-C ?Signed: 05/09/2021 8:24:25 AM By: Sharyn Creamer RN, BSN ?Entered By: Sharyn Creamer on 05/03/2021 17:22:21 ?-------------------------------------------------------------------------------- ?Debridement Details ?Patient Name: ?Date of Service: ?[name], [name] 05/03/2021 2:15 PM ?Medical Record Number: [id number] ?Patient Account Number: [id number] ?Date of Birth/Sex: ?Treating RN: ?[birth year] (88 y.o. [name] ?Primary Care Provider: [name] ?Other Clinician: ?Referring Provider: ?Treating Provider/Extender: [name] ?Perini, Mark A ?Weeks in Treatment: 32 ?Debridement Performed for Assessment: Wound #10 Right,Anterior Upper Leg ?Performed By: Physician [name], PA ?Debridement Type: Chemical/Enzymatic/Mechanical ?Agent Used: gauze ?Level of Consciousness (Pre-procedure): Awake and Alert ?Pre-procedure Verification/Time Out Yes - 15:25 ?Taken: ?Start Time: 15:25 ?Pain Control: ?Other : benzocaine 20%spray ?Instrument: ?Other : gauze ?Bleeding: Minimum ?Hemostasis Achieved: Pressure ?Procedural Pain: 0 ?Post Procedural Pain: 0 ?Response to Treatment: Procedure was tolerated well ?Level of Consciousness (Post- Awake and Alert ?procedure): ?Post Debridement Measurements of Total Wound ?Length: (cm) 9.5 ?Width: (cm) 8.7 ?Depth: (cm) 0.1 ?Volume: (cm?) 6.491 ?Character of Wound/Ulcer Post Debridement: Improved ?Post Procedure Diagnosis ?Same as Pre-procedure ?Electronic Signature(s) ?Signed: 05/05/2021 1:25:50 PM By: [name] PA-C ?Signed: 05/09/2021 8:24:25 AM By: Sharyn Creamer RN, BSN ?Entered By: Sharyn Creamer on 05/03/2021 17:23:19 ?-------------------------------------------------------------------------------- ?HPI Details ?Patient Name: ?Date of Service: ?[name], [name]  05/03/2021 2:15 PM ?Medical Record Number: [id number] ?Patient Account Number: [id number] ?Date of Birth/Sex: ?Treating RN: ?[birth year] (88 y.o. M) [name], [name] ?Primary Care Provider: [name] ?Other Clinician: ?Referring Provider: ?Treating Provider/Extender: [name] ?Perini, Mark A ?Weeks in Treatment: 32 ?History of Present Illness ?HPI Description: 09/21/2020 upon evaluation today patient appears to be doing somewhat poorly in regard to a wound in the right lateral heel location. This ?fortunately does not appear to be too bad but nonetheless is definitely a spot that we need  to work on try to get healed for him. Has been here for quite some ?time. Fortunately there does not appear to be any evidence of active infection at this time which is great news. With that being said I do believe that he has ?some evidence here of peripheral vascular disease this is minimal his ABI 0.87. Other than that he does have a history of BPH, chronic kidney disease stage ?III, and hypertension. Currently he has been using different medication such as Silvadene or antibiotic ointment to try to help treat things. This just is not ?helping out as efficiently as it should be. ?Of note patient's history includes that of a triple bypass in 2010, a kyphoplasty at L1 which was done 8 months ago, unfortunately he had a fracture 2 months ?ago which she sustained a T7/T8 fracture to his spine which ended up with him being in the hospital. It is in that time since then that he developed this wound ?roughly 2 to 3 months ago is not exactly sure when. He does typically wear compression socks in the 20 to 30 mmHg range. ?09/28/2020 upon evaluation today patient presents for follow-up and overall seems to be doing more poorly based on what I am seeing currently. His ABI last ?time was measured at 0.87 but I am beginning to think this may be falsely elevated due to the fact that there is more eschar and subsequently a concern here ?of  vascular compromise that may be part of her issue. I think that we need to avoid any additional sharp debridement and make a referral to vascular surgery ?as soon as possible here. Subsequently also went ahead and did discuss the possibility of starting the patient on Santyl instead of the collagen in order to help ?clean up the surface of the wound this will take the place of sharp debridement currently. ?10/19/2020 upon evaluation today patient's wound actually appears to be doing okay although he has an area on the medial aspect of his heel that is only been ?showing up for the past couple of days this is more of a blister that has ruptured. The good news is there is no deep wound here which is good. Fortunately ?there is no signs of active infection at this time also good news. With that being said however the blood flow was not good with an ABI of 0.67 on the right it ?was around 1 on the left nonetheless I think he does need to see vascular we put that referral in last week on the 12th Dr. Dellia Nims reviewed that for me and ?made that recommendations. Nonetheless the patient has not heard anything from vascular yet as far as getting this scheduled. I think this needs to be done ?sooner rather than later as I am concerned about the possibility of a limb threatening situation if we do not get this under control.. ?11/16/2020 upon evaluation today patient presents for follow-up regarding issues that he has been having with his bilateral lower extremities in particular. He in ?fact still has an issue with his right heel. This appears to be a stage III pressure ulcer at this point there is some slough noted. Subsequently he does have ?revascularization that is taking place on the right leg as a follow-up later this month in that regard. No repeat ABIs. Subsequently the patient also has venous ?stasis bilaterally which is also problematic for him at this time. He has some ulcers on the left ankle and left leg that are  getting  need to be addressed as well. ?12/21/2020 upon evaluation today patient appears to be doing poorly in regard to his bilateral lower extremity ulcers. He still has areas on both lower extremities ?currently in

## 2021-05-04 ENCOUNTER — Inpatient Hospital Stay: Payer: Medicare Other

## 2021-05-04 ENCOUNTER — Inpatient Hospital Stay: Payer: Medicare Other | Attending: Oncology

## 2021-05-04 VITALS — BP 123/52 | HR 54 | Temp 97.8°F | Resp 16

## 2021-05-04 DIAGNOSIS — D696 Thrombocytopenia, unspecified: Secondary | ICD-10-CM | POA: Insufficient documentation

## 2021-05-04 DIAGNOSIS — N189 Chronic kidney disease, unspecified: Secondary | ICD-10-CM | POA: Insufficient documentation

## 2021-05-04 DIAGNOSIS — D649 Anemia, unspecified: Secondary | ICD-10-CM

## 2021-05-04 DIAGNOSIS — D631 Anemia in chronic kidney disease: Secondary | ICD-10-CM | POA: Insufficient documentation

## 2021-05-04 LAB — CBC WITH DIFFERENTIAL (CANCER CENTER ONLY)
Abs Immature Granulocytes: 0.03 10*3/uL (ref 0.00–0.07)
Basophils Absolute: 0.1 10*3/uL (ref 0.0–0.1)
Basophils Relative: 1 %
Eosinophils Absolute: 0.1 10*3/uL (ref 0.0–0.5)
Eosinophils Relative: 1 %
HCT: 27.2 % — ABNORMAL LOW (ref 39.0–52.0)
Hemoglobin: 8.3 g/dL — ABNORMAL LOW (ref 13.0–17.0)
Immature Granulocytes: 1 %
Lymphocytes Relative: 12 %
Lymphs Abs: 0.7 10*3/uL (ref 0.7–4.0)
MCH: 28 pg (ref 26.0–34.0)
MCHC: 30.5 g/dL (ref 30.0–36.0)
MCV: 91.9 fL (ref 80.0–100.0)
Monocytes Absolute: 0.6 10*3/uL (ref 0.1–1.0)
Monocytes Relative: 10 %
Neutro Abs: 4 10*3/uL (ref 1.7–7.7)
Neutrophils Relative %: 75 %
Platelet Count: 122 10*3/uL — ABNORMAL LOW (ref 150–400)
RBC: 2.96 MIL/uL — ABNORMAL LOW (ref 4.22–5.81)
RDW: 19.3 % — ABNORMAL HIGH (ref 11.5–15.5)
WBC Count: 5.3 10*3/uL (ref 4.0–10.5)
nRBC: 0 % (ref 0.0–0.2)

## 2021-05-04 LAB — SAMPLE TO BLOOD BANK

## 2021-05-04 MED ORDER — EPOETIN ALFA-EPBX 20000 UNIT/ML IJ SOLN
20000.0000 [IU] | Freq: Once | INTRAMUSCULAR | Status: AC
  Administered 2021-05-04: 20000 [IU] via SUBCUTANEOUS
  Filled 2021-05-04: qty 1

## 2021-05-04 MED ORDER — EPOETIN ALFA 20000 UNIT/ML IJ SOLN
20000.0000 [IU] | Freq: Once | INTRAMUSCULAR | Status: DC
  Filled 2021-05-04: qty 1

## 2021-05-04 NOTE — Patient Instructions (Addendum)
Epoetin Alfa injection ?What is this medication? ?EPOETIN ALFA (e POE e tin AL fa) helps your body make more red blood cells. This medicine is used to treat anemia caused by chronic kidney disease, cancer chemotherapy, or HIV-therapy. It may also be used before surgery if you have anemia. ?This medicine may be used for other purposes; ask your health care provider or pharmacist if you have questions. ?COMMON BRAND NAME(S): Epogen, Procrit, Retacrit ?What should I tell my care team before I take this medication? ?They need to know if you have any of these conditions: ?cancer ?heart disease ?high blood pressure ?history of blood clots ?history of stroke ?low levels of folate, iron, or vitamin B12 in the blood ?seizures ?an unusual or allergic reaction to erythropoietin, albumin, benzyl alcohol, hamster proteins, other medicines, foods, dyes, or preservatives ?pregnant or trying to get pregnant ?breast-feeding ?How should I use this medication? ?This medicine is for injection into a vein or under the skin. It is usually given by a health care professional in a hospital or clinic setting. ?If you get this medicine at home, you will be taught how to prepare and give this medicine. Use exactly as directed. Take your medicine at regular intervals. Do not take your medicine more often than directed. ?It is important that you put your used needles and syringes in a special sharps container. Do not put them in a trash can. If you do not have a sharps container, call your pharmacist or healthcare provider to get one. ?A special MedGuide will be given to you by the pharmacist with each prescription and refill. Be sure to read this information carefully each time. ?Talk to your pediatrician regarding the use of this medicine in children. While this drug may be prescribed for selected conditions, precautions do apply. ?Overdosage: If you think you have taken too much of this medicine contact a poison control center or emergency  room at once. ?NOTE: This medicine is only for you. Do not share this medicine with others. ?What if I miss a dose? ?If you miss a dose, take it as soon as you can. If it is almost time for your next dose, take only that dose. Do not take double or extra doses. ?What may interact with this medication? ?Interactions have not been studied. ?This list may not describe all possible interactions. Give your health care provider a list of all the medicines, herbs, non-prescription drugs, or dietary supplements you use. Also tell them if you smoke, drink alcohol, or use illegal drugs. Some items may interact with your medicine. ?What should I watch for while using this medication? ?Your condition will be monitored carefully while you are receiving this medicine. ?You may need blood work done while you are taking this medicine. ?This medicine may cause a decrease in vitamin B6. You should make sure that you get enough vitamin B6 while you are taking this medicine. Discuss the foods you eat and the vitamins you take with your health care professional. ?What side effects may I notice from receiving this medication? ?Side effects that you should report to your doctor or health care professional as soon as possible: ?allergic reactions like skin rash, itching or hives, swelling of the face, lips, or tongue ?seizures ?signs and symptoms of a blood clot such as breathing problems; changes in vision; chest pain; severe, sudden headache; pain, swelling, warmth in the leg; trouble speaking; sudden numbness or weakness of the face, arm or leg ?signs and symptoms of a stroke like   changes in vision; confusion; trouble speaking or understanding; severe headaches; sudden numbness or weakness of the face, arm or leg; trouble walking; dizziness; loss of balance or coordination ?Side effects that usually do not require medical attention (report to your doctor or health care professional if they continue or are  bothersome): ?chills ?cough ?dizziness ?fever ?headaches ?joint pain ?muscle cramps ?muscle pain ?nausea, vomiting ?pain, redness, or irritation at site where injected ?This list may not describe all possible side effects. Call your doctor for medical advice about side effects. You may report side effects to FDA at 1-800-FDA-1088. ?Where should I keep my medication? ?Keep out of the reach of children. ?Store in a refrigerator between 2 and 8 degrees C (36 and 46 degrees F). Do not freeze or shake. Throw away any unused portion if using a single-dose vial. Multi-dose vials can be kept in the refrigerator for up to 21 days after the initial dose. Throw away unused medicine. ?NOTE: This sheet is a summary. It may not cover all possible information. If you have questions about this medicine, talk to your doctor, pharmacist, or health care provider. ?? 2023 Elsevier/Gold Standard (2016-08-21 00:00:00) ? ?? 2023 Elsevier/Gold Standard (2016-08-21 00:00:00) ? ?

## 2021-05-05 ENCOUNTER — Other Ambulatory Visit: Payer: Self-pay

## 2021-05-05 ENCOUNTER — Ambulatory Visit: Payer: Medicare Other | Admitting: Vascular Surgery

## 2021-05-05 DIAGNOSIS — M810 Age-related osteoporosis without current pathological fracture: Secondary | ICD-10-CM | POA: Insufficient documentation

## 2021-05-05 DIAGNOSIS — L97822 Non-pressure chronic ulcer of other part of left lower leg with fat layer exposed: Secondary | ICD-10-CM | POA: Diagnosis not present

## 2021-05-05 DIAGNOSIS — L89623 Pressure ulcer of left heel, stage 3: Secondary | ICD-10-CM | POA: Diagnosis not present

## 2021-05-05 DIAGNOSIS — L89613 Pressure ulcer of right heel, stage 3: Secondary | ICD-10-CM | POA: Diagnosis not present

## 2021-05-05 DIAGNOSIS — R310 Gross hematuria: Secondary | ICD-10-CM | POA: Diagnosis not present

## 2021-05-05 DIAGNOSIS — S91309A Unspecified open wound, unspecified foot, initial encounter: Secondary | ICD-10-CM | POA: Diagnosis not present

## 2021-05-07 DIAGNOSIS — I130 Hypertensive heart and chronic kidney disease with heart failure and stage 1 through stage 4 chronic kidney disease, or unspecified chronic kidney disease: Secondary | ICD-10-CM | POA: Diagnosis not present

## 2021-05-07 DIAGNOSIS — I359 Nonrheumatic aortic valve disorder, unspecified: Secondary | ICD-10-CM | POA: Diagnosis not present

## 2021-05-07 DIAGNOSIS — I712 Thoracic aortic aneurysm, without rupture, unspecified: Secondary | ICD-10-CM | POA: Diagnosis not present

## 2021-05-07 DIAGNOSIS — M353 Polymyalgia rheumatica: Secondary | ICD-10-CM | POA: Diagnosis not present

## 2021-05-07 DIAGNOSIS — M8008XD Age-related osteoporosis with current pathological fracture, vertebra(e), subsequent encounter for fracture with routine healing: Secondary | ICD-10-CM | POA: Diagnosis not present

## 2021-05-07 DIAGNOSIS — I480 Paroxysmal atrial fibrillation: Secondary | ICD-10-CM | POA: Diagnosis not present

## 2021-05-07 DIAGNOSIS — G8929 Other chronic pain: Secondary | ICD-10-CM | POA: Diagnosis not present

## 2021-05-07 DIAGNOSIS — E785 Hyperlipidemia, unspecified: Secondary | ICD-10-CM | POA: Diagnosis not present

## 2021-05-07 DIAGNOSIS — F0390 Unspecified dementia without behavioral disturbance: Secondary | ICD-10-CM | POA: Diagnosis not present

## 2021-05-07 DIAGNOSIS — G4733 Obstructive sleep apnea (adult) (pediatric): Secondary | ICD-10-CM | POA: Diagnosis not present

## 2021-05-07 DIAGNOSIS — D631 Anemia in chronic kidney disease: Secondary | ICD-10-CM | POA: Diagnosis not present

## 2021-05-07 DIAGNOSIS — I252 Old myocardial infarction: Secondary | ICD-10-CM | POA: Diagnosis not present

## 2021-05-07 DIAGNOSIS — N1831 Chronic kidney disease, stage 3a: Secondary | ICD-10-CM | POA: Diagnosis not present

## 2021-05-07 DIAGNOSIS — N403 Nodular prostate with lower urinary tract symptoms: Secondary | ICD-10-CM | POA: Diagnosis not present

## 2021-05-07 DIAGNOSIS — I5032 Chronic diastolic (congestive) heart failure: Secondary | ICD-10-CM | POA: Diagnosis not present

## 2021-05-07 DIAGNOSIS — I2510 Atherosclerotic heart disease of native coronary artery without angina pectoris: Secondary | ICD-10-CM | POA: Diagnosis not present

## 2021-05-08 ENCOUNTER — Telehealth: Payer: Self-pay | Admitting: Pharmacy Technician

## 2021-05-08 DIAGNOSIS — N401 Enlarged prostate with lower urinary tract symptoms: Secondary | ICD-10-CM | POA: Diagnosis not present

## 2021-05-08 NOTE — Telephone Encounter (Addendum)
Auth Submission: prolia Payer: [employer] MEDICARE Medication & CPT/J Code(s) submitted: Prolia (Denosumab) [id number] Route of submission (phone, fax, portal): PHONE Auth type: Buy/Bill Units/visits requested: X2 Reference number: [id number] Approval from: 07/12/21 to 07/12/22   Verified that [place] has been added to [place]. 07/19/21

## 2021-05-09 NOTE — Progress Notes (Signed)
[place], Keyon I. ([id number]) ?Visit Report for 05/03/2021 ?Fall Risk Assessment Details ?Patient Name: Date of Service: ?[name], [name] 05/03/2021 2:15 PM ?Medical Record Number: [id number] ?Patient Account Number: [id number] ?Date of Birth/Sex: Treating RN: ?1926/03/23 (88 y.o. [name] ?Primary Care Charniece Venturino: [name] Other Clinician: ?Referring Nawal Burling: ?Treating Karime Scheuermann/Extender: [name] ?Perini, Mark A ?Weeks in Treatment: 32 ?Fall Risk Assessment Items ?Have you had 2 or more falls in the last 12 monthso 0 Yes ?Have you had any fall that resulted in injury in the last 12 monthso 0 Yes ?FALLS RISK SCREEN ?History of falling - immediate or within 3 months 25 Yes ?Secondary diagnosis (Do you have 2 or more medical diagnoseso) 15 Yes ?Ambulatory aid ?None/bed rest/wheelchair/nurse 0 No ?Crutches/cane/walker 15 Yes ?Furniture 0 No ?Intravenous therapy Access/Saline/Heparin Lock 0 No ?Gait/Transferring ?Normal/ bed rest/ wheelchair 0 No ?Weak (short steps with or without shuffle, stooped but able to lift head while walking, may seek 0 No ?support from furniture) ?Impaired (short steps with shuffle, may have difficulty arising from chair, head down, impaired 20 Yes ?balance) ?Mental Status ?Oriented to own ability 0 Yes ?Electronic Signature(s) ?Signed: 05/09/2021 8:24:25 AM By: Sharyn Creamer RN, BSN ?Entered By: Sharyn Creamer on 05/03/2021 14:52:13 ?

## 2021-05-09 NOTE — Progress Notes (Signed)
[name], [name] I. ([id number]) ?Visit Report for 05/03/2021 ?Arrival Information Details ?Patient Name: Date of Service: ?[name], [name] 05/03/2021 2:15 PM ?Medical Record Number: [id number] ?Patient Account Number: [id number] ?Date of Birth/Sex: Treating RN: ?26-May-1926 (88 y.o. [name] [name] ?Primary Care Victoriano Campion: [name] Other Clinician: ?Referring [name]: ?Treating Hurschel Paynter/Extender: [name] ?Perini, Mark A ?Weeks in Treatment: 32 ?Visit Information History Since Last Visit ?Added or deleted any medications: Yes ?Patient Arrived: Wheel Chair ?Any new allergies or adverse reactions: No ?Arrival Time: 14:47 ?Had a fall or experienced change in Yes ?Accompanied By: son ?activities of daily living that may affect ?Transfer Assistance: None ?risk of falls: ?Patient Identification Verified: Yes ?Signs or symptoms of abuse/neglect since last visito No ?Secondary Verification Process Completed: Yes ?Hospitalized since last visit: No ?Patient Requires Transmission-Based Precautions: No ?Implantable device outside of the clinic excluding No ?Patient Has Alerts: Yes ?cellular tissue based products placed in the center ?Patient Alerts: Patient on Blood Thinner since last visit: ?Has Dressing in Place as Prescribed: Yes ?Pain Present Now: No ?Electronic Signature(s) ?Signed: 05/09/2021 8:24:25 AM By: Sharyn Creamer RN, BSN ?Entered By: Sharyn Creamer on 05/03/2021 14:51:03 ?-------------------------------------------------------------------------------- ?Encounter Discharge Information Details ?Patient Name: Date of Service: ?[name], [name] 05/03/2021 2:15 PM ?Medical Record Number: [id number] ?Patient Account Number: [id number] ?Date of Birth/Sex: Treating RN: ?[birth year] (88 y.o. [name] [name] ?Primary Care Misao Fackrell: [name] Other Clinician: ?Referring Trayden Brandy: ?Treating Bethzaida Boord/Extender: [name] ?Perini, Mark A ?Weeks in Treatment: 32 ?Encounter Discharge Information Items ?Discharge  Condition: Stable ?Ambulatory Status: Wheelchair ?Discharge Destination: Home ?Transportation: Private Auto ?Accompanied By: son ?Schedule Follow-up Appointment: Yes ?Clinical Summary of Care: Patient Declined ?Electronic Signature(s) ?Signed: 05/09/2021 8:24:25 AM By: Sharyn Creamer RN, BSN ?Entered By: Sharyn Creamer on 05/03/2021 16:51:05 ?-------------------------------------------------------------------------------- ?Lower Extremity Assessment Details ?Patient Name: ?Date of Service: ?[name], [name] 05/03/2021 2:15 PM ?Medical Record Number: [id number] ?Patient Account Number: [id number] ?Date of Birth/Sex: ?Treating RN: ?[birth year] (88 y.o. [name] [name] ?Primary Care [name]: [name] ?Other Clinician: ?Referring [name]: ?Treating [name]/Extender: [name] ?Perini, Mark A ?Weeks in Treatment: 32 ?Edema Assessment ?Assessed: [Left: No] [Right: No] ?Edema: [Left: Yes] [Right: Yes] ?Calf ?Left: Right: ?Point of Measurement: 34 cm From Medial Instep 43 cm 44 cm ?Ankle ?Left: Right: ?Point of Measurement: 11 cm From Medial Instep 27 cm 30 cm ?Vascular Assessment ?Pulses: ?Dorsalis Pedis ?Palpable: [Left:Yes] [Right:Yes] ?Electronic Signature(s) ?Signed: 05/09/2021 8:24:25 AM By: Sharyn Creamer RN, BSN ?Entered By: Sharyn Creamer on 05/03/2021 14:55:27 ?-------------------------------------------------------------------------------- ?Multi-Disciplinary Care Plan Details ?Patient Name: ?Date of Service: ?[name], [name] 05/03/2021 2:15 PM ?Medical Record Number: [id number] ?Patient Account Number: [id number] ?Date of Birth/Sex: ?Treating RN: ?04/02/1926 (88 y.o. [name] [name] ?Primary Care [name]: [name] ?Other Clinician: ?Referring [name]: ?Treating [name]/Extender: [name] ?Perini, Mark A ?Weeks in Treatment: 32 ?Active Inactive ?Abuse / Safety / Falls / Self Care Management ?Nursing Diagnoses: ?Potential for falls ?Goals: ?Patient will remain injury free related to  falls ?Date Initiated: 03/22/2021 ?Target Resolution Date: 05/05/2021 ?Goal Status: Active ?Interventions: ?Assess: immobility, friction, shearing, incontinence upon admission and as needed ?Assess impairment of mobility on admission and as needed per policy ?Assess self care needs on admission and as needed ?Provide education on fall prevention ?Notes: ?Nutrition ?Nursing Diagnoses: ?Potential for alteratiion in Nutrition/Potential for imbalanced nutrition ?Goals: ?Patient/caregiver agrees to and verbalizes understanding of need to obtain nutritional consultation ?Date Initiated: 03/22/2021 ?T arget  Resolution Date: 05/26/2021 ?Goal Status: Active ?Interventions: ?Provide education on nutrition ?Treatment Activities: ?Education provided on Nutrition : 03/22/2021 ?Giving encouragement to exercise : 03/22/2021 ?Notes: ?Electronic Signature(s) ?Signed: 05/09/2021 8:24:25 AM By: Sharyn Creamer RN, BSN ?Entered By: Sharyn Creamer on 05/03/2021 15:33:14 ?-------------------------------------------------------------------------------- ?Pain Assessment Details ?Patient Name: ?Date of Service: ?[name], [name] 05/03/2021 2:15 PM ?Medical Record Number: [id number] ?Patient Account Number: [id number] ?Date of Birth/Sex: ?Treating RN: ?[birth year] (88 y.o. [name] [name] ?Primary Care [name]: [name] ?Other Clinician: ?Referring [name]: ?Treating [name]/Extender: [name] ?Perini, Mark A ?Weeks in Treatment: 32 ?Active Problems ?Location of Pain Severity and Description of Pain ?Patient Has Paino No ?Site Locations ?Pain Management and Medication ?Current Pain Management: ?Electronic Signature(s) ?Signed: 05/09/2021 8:24:25 AM By: Sharyn Creamer RN, BSN ?Entered By: Sharyn Creamer on 05/03/2021 14:53:55 ?-------------------------------------------------------------------------------- ?Patient/Caregiver Education Details ?Patient Name: ?Date of Service: ?[name], [name] 5/3/2023andnbsp2:15 PM ?Medical Record Number:  [id number] ?Patient Account Number: [id number] ?Date of Birth/Gender: ?Treating RN: ?07-22-1926 (88 y.o. [name] [name] ?Primary Care Physician: [name] ?Other Clinician: ?Referring Physician: ?Treating Physician/Extender: [name] ?Perini, Mark A ?Weeks in Treatment: 32 ?Education Assessment ?Education Provided To: ?Patient and Caregiver ?Education Topics Provided ?Safety: ?Methods: Explain/Verbal ?Responses: State content correctly ?Wound/Skin Impairment: ?Methods: Explain/Verbal ?Responses: State content correctly ?Electronic Signature(s) ?Signed: 05/09/2021 8:24:25 AM By: Sharyn Creamer RN, BSN ?Entered By: Sharyn Creamer on 05/03/2021 15:33:45 ?-------------------------------------------------------------------------------- ?Wound Assessment Details ?Patient Name: ?Date of Service: ?[name], [name] 05/03/2021 2:15 PM ?Medical Record Number: [id number] ?Patient Account Number: [id number] ?Date of Birth/Sex: ?Treating RN: ?10-30-1926 (88 y.o. [name] [name] ?Primary Care Abdel Effinger: [name] ?Other Clinician: ?Referring Deajah Erkkila: ?Treating Ely Ballen/Extender: [name] ?Perini, Mark A ?Weeks in Treatment: 32 ?Wound Status ?Wound Number: 1 Primary Pressure Ulcer ?Etiology: ?Wound Location: Right, Lateral Calcaneus ?Wound Open ?Wounding Event: Pressure Injury ?Status: ?Date Acquired: 06/08/2020 ?Comorbid Cataracts, Sleep Apnea, Arrhythmia, Congestive Heart Failure, ?Weeks Of Treatment: 32 ?History: Coronary Artery Disease, Hypertension, Myocardial Infarction, ?Clustered Wound: No End Stage Renal Disease, Gout, Dementia, Neuropathy ?Photos ?Photo Uploaded By: Donavan Burnet on 05/03/2021 17:34:45 ?Wound Measurements ?Length: (cm) 0.5 ?Width: (cm) 0.5 ?Depth: (cm) 0.1 ?Area: (cm?) 0.196 ?Volume: (cm?) 0.02 ?% Reduction in Area: 60.4% ?% Reduction in Volume: 59.2% ?Epithelialization: Small (1-33%) ?Tunneling: No ?Undermining: No ?Wound Description ?Classification: Category/Stage III ?Wound Margin:  Distinct, outline attached ?Exudate Amount: Medium ?Exudate Type: Serosanguineous ?Exudate Color: red, brown ?Foul Odor After Cleansing: No ?Slough/Fibrino Yes ?Wound Bed ?Granulation Amount: Small (1-33%) Exposed

## 2021-05-10 ENCOUNTER — Encounter (HOSPITAL_BASED_OUTPATIENT_CLINIC_OR_DEPARTMENT_OTHER): Payer: Medicare Other | Admitting: Physician Assistant

## 2021-05-10 DIAGNOSIS — L97312 Non-pressure chronic ulcer of right ankle with fat layer exposed: Secondary | ICD-10-CM | POA: Diagnosis not present

## 2021-05-10 DIAGNOSIS — L97322 Non-pressure chronic ulcer of left ankle with fat layer exposed: Secondary | ICD-10-CM | POA: Diagnosis not present

## 2021-05-10 DIAGNOSIS — N183 Chronic kidney disease, stage 3 unspecified: Secondary | ICD-10-CM | POA: Diagnosis not present

## 2021-05-10 DIAGNOSIS — L89613 Pressure ulcer of right heel, stage 3: Secondary | ICD-10-CM | POA: Diagnosis not present

## 2021-05-10 DIAGNOSIS — S71111A Laceration without foreign body, right thigh, initial encounter: Secondary | ICD-10-CM | POA: Diagnosis not present

## 2021-05-10 DIAGNOSIS — I129 Hypertensive chronic kidney disease with stage 1 through stage 4 chronic kidney disease, or unspecified chronic kidney disease: Secondary | ICD-10-CM | POA: Diagnosis not present

## 2021-05-10 DIAGNOSIS — I7389 Other specified peripheral vascular diseases: Secondary | ICD-10-CM | POA: Diagnosis not present

## 2021-05-10 DIAGNOSIS — L97112 Non-pressure chronic ulcer of right thigh with fat layer exposed: Secondary | ICD-10-CM | POA: Diagnosis not present

## 2021-05-10 DIAGNOSIS — I872 Venous insufficiency (chronic) (peripheral): Secondary | ICD-10-CM | POA: Diagnosis not present

## 2021-05-10 DIAGNOSIS — N401 Enlarged prostate with lower urinary tract symptoms: Secondary | ICD-10-CM | POA: Diagnosis not present

## 2021-05-10 DIAGNOSIS — S41111A Laceration without foreign body of right upper arm, initial encounter: Secondary | ICD-10-CM | POA: Diagnosis not present

## 2021-05-10 DIAGNOSIS — L988 Other specified disorders of the skin and subcutaneous tissue: Secondary | ICD-10-CM | POA: Diagnosis not present

## 2021-05-10 DIAGNOSIS — L97822 Non-pressure chronic ulcer of other part of left lower leg with fat layer exposed: Secondary | ICD-10-CM | POA: Diagnosis not present

## 2021-05-10 DIAGNOSIS — L89153 Pressure ulcer of sacral region, stage 3: Secondary | ICD-10-CM | POA: Diagnosis not present

## 2021-05-10 NOTE — Progress Notes (Addendum)
Bellfountain, Jameek I. ([id number]) ?Visit Report for 05/10/2021 ?Chief Complaint Document Details ?Patient Name: Date of Service: ?[name] [name], [name] V ID I. 05/10/2021 1:30 PM ?Medical Record Number: [id number] ?Patient Account Number: [id number] ?Date of Birth/Sex: Treating RN: ?08-Jun-1926 (88 y.o. M) [name], [name] ?Primary Care Provider: [name] Other Clinician: ?Referring Provider: ?Treating Provider/Extender: [name] ?Perini, Mark A ?Weeks in Treatment: 33 ?Information Obtained from: Patient ?Chief Complaint ?Multiple ulcers noted over the upper and lower extremities ?Electronic Signature(s) ?Signed: 05/10/2021 1:17:28 PM By: [name] PA-C ?Entered By: [name] on 05/10/2021 13:17:28 ?-------------------------------------------------------------------------------- ?HPI Details ?Patient Name: Date of Service: ?[name] [name], [name] V ID I. 05/10/2021 1:30 PM ?Medical Record Number: [id number] ?Patient Account Number: [id number] ?Date of Birth/Sex: Treating RN: ?1926/06/23 (88 y.o. M) [name], [name] ?Primary Care Provider: [name] Other Clinician: ?Referring Provider: ?Treating Provider/Extender: [name] ?Perini, Mark A ?Weeks in Treatment: 33 ?History of Present Illness ?HPI Description: 09/21/2020 upon evaluation today patient appears to be doing somewhat poorly in regard to a wound in the right lateral heel location. This ?fortunately does not appear to be too bad but nonetheless is definitely a spot that we need to work on try to get healed for him. Has been here for quite some ?time. Fortunately there does not appear to be any evidence of active infection at this time which is great news. With that being said I do believe that he has ?some evidence here of peripheral vascular disease this is minimal his ABI 0.87. Other than that he does have a history of BPH, chronic kidney disease stage ?III, and hypertension. Currently he has been using different medication such as Silvadene or antibiotic ointment to  try to help treat things. This just is not ?helping out as efficiently as it should be. ?Of note patient's history includes that of a triple bypass in 2010, a kyphoplasty at L1 which was done 8 months ago, unfortunately he had a fracture 2 months ?ago which she sustained a T7/T8 fracture to his spine which ended up with him being in the hospital. It is in that time since then that he developed this wound ?roughly 2 to 3 months ago is not exactly sure when. He does typically wear compression socks in the 20 to 30 mmHg range. ?09/28/2020 upon evaluation today patient presents for follow-up and overall seems to be doing more poorly based on what I am seeing currently. His ABI last ?time was measured at 0.87 but I am beginning to think this may be falsely elevated due to the fact that there is more eschar and subsequently a concern here ?of vascular compromise that may be part of her issue. I think that we need to avoid any additional sharp debridement and make a referral to vascular surgery ?as soon as possible here. Subsequently also went ahead and did discuss the possibility of starting the patient on Santyl instead of the collagen in order to help ?clean up the surface of the wound this will take the place of sharp debridement currently. ?10/19/2020 upon evaluation today patient's wound actually appears to be doing okay although he has an area on the medial aspect of his heel that is only been ?showing up for the past couple of days this is more of a blister that has ruptured. The good news is there is no deep wound here which is good. Fortunately ?there is no signs of active infection at this time also good news. With that being said  however the blood flow was not good with an ABI of 0.67 on the right it ?was around 1 on the left nonetheless I think he does need to see vascular we put that referral in last week on the 12th Dr. Dellia Nims reviewed that for me and ?made that recommendations. Nonetheless the patient has  not heard anything from vascular yet as far as getting this scheduled. I think this needs to be done ?sooner rather than later as I am concerned about the possibility of a limb threatening situation if we do not get this under control.. ?11/16/2020 upon evaluation today patient presents for follow-up regarding issues that he has been having with his bilateral lower extremities in particular. He in ?fact still has an issue with his right heel. This appears to be a stage III pressure ulcer at this point there is some slough noted. Subsequently he does have ?revascularization that is taking place on the right leg as a follow-up later this month in that regard. No repeat ABIs. Subsequently the patient also has venous ?stasis bilaterally which is also problematic for him at this time. He has some ulcers on the left ankle and left leg that are getting need to be addressed as well. ?12/21/2020 upon evaluation today patient appears to be doing poorly in regard to his bilateral lower extremity ulcers. He still has areas on both lower extremities ?currently ineffective even some areas that were not there previous. Fortunately there is no signs of active infection at this time. No fevers, chills, nausea, ?vomiting, or diarrhea. ?01/04/21 upon evaluation today patient actually appears to be doing quite well in regard to his wounds. Fortunately there does not appear to be any signs of ?active infection which is great. There is some necrotic tissue that I think would be helpful to debride away pretty much on all wounds other than the heel which ?appears to be doing quite well. ?01/25/2021 upon evaluation today patient appears to be doing okay in regard to his wounds. There are some measurements that are definitely smaller which is ?good news. Fortunately I do not see any evidence of active infection locally nor systemically at this point which is also great news. He does have a deep ?tissue injury which seems to be getting a bit  worse on the lateral portion of his right heel. He tells me is not been using the bunny boots and he has not been ?using a pillow under his calves either. Nonetheless I think this is absolutely a pressure injury and I am very concerned about this. Also I do not want this to get ?a lot worse. He also tells me his hemoglobin is around 7 he is going to be seeing hematology for a transfusion either today or tomorrow most likely this will ?definitely help with his healing. ?02/01/2021 upon evaluation today patient actually appears to be making excellent progress in regard to his wound currently. Fortunately I feel like that all the ?wounds are showing signs of improvement the Iodoflex seems to be doing an awesome job. I am very pleased with where we stand today. There does not ?appear to be any signs of active infection. ?02/22/2021 upon evaluation today patient appears to be doing well with regard to his wound. He has been tolerating the dressing changes without complication. ?Fortunately there does not appear to be any evidence of active infection locally or systemically at this point which is good news. Overall I think that we are ?headed in the right direction and the Iodoflex  is doing a good job. ?03/08/2021 upon evaluation today patient appears to be doing well all things considered with regard to his wounds. He has been tolerating the dressing changes ?without complication. Fortunately there does not appear to be any evidence of active infection locally nor systemically which is great news overall I think the ?Iodosorb is doing an awesome job here. ?03/22/2021 upon evaluation today patient appears to be doing well with regard to his wounds for the most part. The biggest issue is that he does have a new ?wound on his gluteal region really more sacral than anything to be honest. Unfortunately this is going to be something that we need to address sooner rather ?than later. It does not appear to be too bad right now but  obviously things can get significantly worse. ?04-05-2021 upon evaluation today patient actually appears to be doing well in general in regard to his wounds. He does have an area growing on the left anterior ?sh

## 2021-05-11 NOTE — Progress Notes (Signed)
?Office Note  ? ?HPI: [name] [name] is a pleasant  88 y.o. ([birth year]) male presenting in follow up s/p angiogram with PTA balloon angioplasty on 10/26/2020 for a right heel wound.  He has a PMH significant for CKD stage II, Hypertension, CHF, afib, hyperlipidemia, Obesity, chronic urethral obstruction with indwelling foley catheter, AS s/p TAVR, CAD with hx of CABG.  The pt is non ambulatory secondary to chronic vertebral body compression.  ?  ?Presents today accompanied by his wife.  Since last seen in clinic, his wounds have continued to heal bilaterally.  The wound on his right heel has healed.  [name] continues to struggle with bilateral lower extremity lymphedema as well as neuropathy.  He has been working with physical therapy on a weekly basis and is able to ambulate over 20 feet at a time.  [name] has been compliant with compression stockings, purchasing some with zippers. ? ? ?Past Medical History:  ?Diagnosis Date  ? AAA (abdominal aortic aneurysm) ([place])   ? Acute coronary syndrome ([place]) 02/29/2012  ? Acute systolic heart failure ([place]) 03/03/2012  ? Angina   ? Arthritis   ? Ascending aorta dilatation ([place]) 03/13/2012  ? Fusiform dilatation of the ascending thoracic aorta discovered during surgery, max diameter 4.2-4.3 cm  ? Atrial fibrillation ([place]) 02/29/2012  ? Recurrent paroxysmal, new-onset  ? BPH (benign prostatic hypertrophy)   ? CAD (coronary artery disease)   ? Cath April 2012 40% left main, 50% LAD, 40% OM, occluded RCA with L-R collaterals   ? CHF (congestive heart failure) ([place])   ? Chronic venous insufficiency   ? Dyspnea   ? GERD (gastroesophageal reflux disease)   ? Glaucoma   ? Gout   ? Headache   ? MIGRAINES in the past  ? History of kidney stones   ? Hyperlipidemia   ? Hypertension   ? MI (myocardial infarction) ([place])   ? Obesity (BMI 30-39.9)   ? Paroxysmal atrial fibrillation ([place]) 02/29/2012  ? Recurrent paroxysmal, new-onset   ? PMR (polymyalgia rheumatica) ([facility])   ? Polymyalgia rheumatica  ([place]) 02/06/2019  ? Pre-diabetes   ? S/P CABG x 3 03/06/2012  ? LIMA to LAD, SVG to OM, SVG to RCA, EVH via right thigh  ? S/P Maze operation for atrial fibrillation 03/06/2012  ? Complete bilateral lesions set using bipolar radiofrequency and cryothermy ablation with clipping of LA appendage  ? 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  ? s/p TAVR with a 26 mm Edwards via the left subclavian by Drs Buena Irish and Cyndia Bent  ? Severe aortic stenosis 09/29/2019  ? Sleep apnea   ? USES CPAP NIGHTLY  ? ? ?Past Surgical History:  ?Procedure Laterality Date  ? ABDOMINAL AORTOGRAM W/LOWER EXTREMITY N/A 10/26/2020  ? Procedure: ABDOMINAL AORTOGRAM W/LOWER EXTREMITY;  Surgeon: Broadus John, MD;  Location: [facility];  Service: Cardiovascular;  Laterality: N/A;  ? CARDIAC CATHETERIZATION    ? CATARACT EXTRACTION, BILATERAL    ? with lens implants  ? CHOLECYSTECTOMY N/A 03/24/2012  ? Procedure: LAPAROSCOPIC CHOLECYSTECTOMY WITH INTRAOPERATIVE CHOLANGIOGRAM;  Surgeon: Zenovia Jarred, MD;  Location: [facility];  Service: General;  Laterality: N/A;  ? CORONARY ARTERY BYPASS GRAFT N/A 03/06/2012  ? Procedure: CORONARY ARTERY BYPASS GRAFTING (CABG);  Surgeon: Rexene Alberts, MD;  Location: [facility];  Service: Open Heart Surgery;  Laterality: N/A;  ? ENDOVEIN HARVEST OF GREATER SAPHENOUS VEIN Right 03/06/2012  ? Procedure: ENDOVEIN HARVEST OF GREATER SAPHENOUS VEIN;  Surgeon: Valentina Gu  Roxy Manns, MD;  Location: [facility];  Service: Open Heart Surgery;  Laterality: Right;  ? INTRAOPERATIVE TRANSESOPHAGEAL ECHOCARDIOGRAM N/A 03/06/2012  ? Procedure: INTRAOPERATIVE TRANSESOPHAGEAL ECHOCARDIOGRAM;  Surgeon: Rexene Alberts, MD;  Location: [facility];  Service: Open Heart Surgery;  Laterality: N/A;  ? IR KYPHO LUMBAR INC FX REDUCE BONE BX UNI/BIL CANNULATION INC/IMAGING  02/23/2020  ? LEFT HEART CATH Right 03/02/2012  ? Procedure: LEFT HEART CATH;  Surgeon: Sherren Mocha, MD;  Location: [facility];  Service: Cardiovascular;  Laterality: Right;   ? MAZE N/A 03/06/2012  ? Procedure: MAZE;  Surgeon: Rexene Alberts, MD;  Location: [facility];  Service: Open Heart Surgery;  Laterality: N/A;  ? PERIPHERAL VASCULAR INTERVENTION Right 10/26/2020  ? Procedure: PERIPHERAL VASCULAR INTERVENTION;  Surgeon: Broadus John, MD;  Location: [facility];  Service: Cardiovascular;  Laterality: Right;  ? PROSTATECTOMY    ? partial  ? RIGHT/LEFT HEART CATH AND CORONARY/GRAFT ANGIOGRAPHY N/A 09/29/2019  ? Procedure: RIGHT/LEFT HEART CATH AND CORONARY/GRAFT ANGIOGRAPHY;  Surgeon: Adrian Prows, MD;  Location: [facility];  Service: Cardiovascular;  Laterality: N/A;  ? TEE WITHOUT CARDIOVERSION N/A 11/10/2019  ? Procedure: TRANSESOPHAGEAL ECHOCARDIOGRAM (TEE);  Surgeon: Burnell Blanks, MD;  Location: [facility];  Service: Open Heart Surgery;  Laterality: N/A;  ? TRANSCATHETER AORTIC VALVE REPLACEMENT, TRANSFEMORAL  11/10/2019  ? ? ?Social History  ? ?Socioeconomic History  ? Marital status: Married  ?  Spouse name: Not on file  ? Number of children: 4  ? Years of education: Not on file  ? Highest education level: Not on file  ?Occupational History  ? Occupation: retired  ?  Comment: [employer]  ?Tobacco Use  ? Smoking status: Former  ?  Packs/day: 0.50  ?  Years: 15.00  ?  Pack years: 7.50  ?  Types: Cigarettes  ?  Quit date: 11/18/1962  ?  Years since quitting: 58.5  ?  Passive exposure: Never  ? Smokeless tobacco: Never  ?Vaping Use  ? Vaping Use: Never used  ?Substance and Sexual Activity  ? Alcohol use: Yes  ?  Alcohol/week: 2.0 standard drinks  ?  Types: 2 Shots of liquor per week  ?  Comment: daily  ? Drug use: Never  ? Sexual activity: Yes  ?Other Topics Concern  ? Not on file  ?Social History Narrative  ? Lives with wife in [place].    ? Very active physically without significant limitations.   ? Exercises regularly  ? ?Social Determinants of Health  ? ?Financial Resource Strain: Not on file  ?Food Insecurity: Not on file  ?Transportation Needs: Not on  file  ?Physical Activity: Not on file  ?Stress: Not on file  ?Social Connections: Not on file  ?Intimate Partner Violence: Not on file  ? ?Family History  ?Problem Relation Age of Onset  ? Dementia Mother   ? Stroke Brother   ? ? ?Current Outpatient Medications  ?Medication Sig Dispense Refill  ? acetaminophen (TYLENOL) 500 MG tablet Take 2 tablets (1,000 mg total) by mouth 3 (three) times daily. For 3 to 5 days and then change to 1000 mg 3 times daily as needed for pain. (Patient taking differently: Take 500 mg by mouth as needed for mild pain.)    ? amiodarone (PACERONE) 200 MG tablet Take 1 tablet (200 mg total) by mouth daily. 90 tablet 3  ? amLODipine (NORVASC) 5 MG tablet Take 5 mg by mouth daily.    ? aspirin EC  81 MG EC tablet Take 1 tablet (81 mg total) by mouth daily. Swallow whole. 30 tablet 11  ? atorvastatin (LIPITOR) 40 MG tablet Take 1 tablet (40 mg total) by mouth daily. 90 tablet 1  ? cadexomer iodine (IODOSORB) 0.9 % gel Apply 1 application. topically every other day as needed for wound care.    ? cholecalciferol (VITAMIN D3) 25 MCG (1000 UNIT) tablet Take 1,000 Units by mouth daily.     ? Cyanocobalamin (VITAMIN B-12) 5000 MCG TBDP Take 5,000 mcg by mouth daily.    ? denosumab (PROLIA) 60 MG/ML SOSY injection Inject 60 mg into the skin every 6 (six) months.    ? docusate sodium (COLACE) 100 MG capsule Take 1 capsule (100 mg total) by mouth 2 (two) times daily. (Patient taking differently: Take 100 mg by mouth every evening.)    ? EPINEPHrine 0.3 mg/0.3 mL IJ SOAJ injection Inject 0.3 mLs (0.3 mg total) into the muscle as needed for anaphylaxis. (Patient taking differently: Inject 0.3 mg into the muscle once as needed for anaphylaxis.) 2 each 2  ? ezetimibe (ZETIA) 10 MG tablet Take 10 mg by mouth daily.    ? finasteride (PROSCAR) 5 MG tablet Take 5 mg by mouth daily.    ? fish oil-omega-3 fatty acids 1000 MG capsule Take 1 g by mouth daily.     ? fluticasone (FLONASE) 50 MCG/ACT nasal spray Place  1 spray into both nostrils daily as needed for allergies.    ? gabapentin (NEURONTIN) 100 MG capsule Take 100-200 mg by mouth See admin instructions. '200mg'$  in the morning, '100mg'$  in the evening, and '200mg'$

## 2021-05-12 ENCOUNTER — Ambulatory Visit (INDEPENDENT_AMBULATORY_CARE_PROVIDER_SITE_OTHER): Payer: Medicare Other | Admitting: Vascular Surgery

## 2021-05-12 VITALS — BP 119/57 | HR 54 | Temp 97.8°F | Resp 20 | Ht 72.0 in

## 2021-05-12 DIAGNOSIS — I739 Peripheral vascular disease, unspecified: Secondary | ICD-10-CM

## 2021-05-12 DIAGNOSIS — I890 Lymphedema, not elsewhere classified: Secondary | ICD-10-CM

## 2021-05-17 ENCOUNTER — Encounter (HOSPITAL_BASED_OUTPATIENT_CLINIC_OR_DEPARTMENT_OTHER): Payer: Medicare Other | Admitting: Physician Assistant

## 2021-05-17 DIAGNOSIS — L97322 Non-pressure chronic ulcer of left ankle with fat layer exposed: Secondary | ICD-10-CM | POA: Diagnosis not present

## 2021-05-17 DIAGNOSIS — I129 Hypertensive chronic kidney disease with stage 1 through stage 4 chronic kidney disease, or unspecified chronic kidney disease: Secondary | ICD-10-CM | POA: Diagnosis not present

## 2021-05-17 DIAGNOSIS — L89153 Pressure ulcer of sacral region, stage 3: Secondary | ICD-10-CM | POA: Diagnosis not present

## 2021-05-17 DIAGNOSIS — L89613 Pressure ulcer of right heel, stage 3: Secondary | ICD-10-CM | POA: Diagnosis not present

## 2021-05-17 DIAGNOSIS — L97312 Non-pressure chronic ulcer of right ankle with fat layer exposed: Secondary | ICD-10-CM | POA: Diagnosis not present

## 2021-05-17 DIAGNOSIS — L988 Other specified disorders of the skin and subcutaneous tissue: Secondary | ICD-10-CM | POA: Diagnosis not present

## 2021-05-17 DIAGNOSIS — N183 Chronic kidney disease, stage 3 unspecified: Secondary | ICD-10-CM | POA: Diagnosis not present

## 2021-05-17 DIAGNOSIS — R1084 Generalized abdominal pain: Secondary | ICD-10-CM | POA: Diagnosis not present

## 2021-05-17 DIAGNOSIS — I70233 Atherosclerosis of native arteries of right leg with ulceration of ankle: Secondary | ICD-10-CM | POA: Diagnosis not present

## 2021-05-17 DIAGNOSIS — N401 Enlarged prostate with lower urinary tract symptoms: Secondary | ICD-10-CM | POA: Diagnosis not present

## 2021-05-17 DIAGNOSIS — I872 Venous insufficiency (chronic) (peripheral): Secondary | ICD-10-CM | POA: Diagnosis not present

## 2021-05-17 DIAGNOSIS — I7389 Other specified peripheral vascular diseases: Secondary | ICD-10-CM | POA: Diagnosis not present

## 2021-05-17 DIAGNOSIS — S41111A Laceration without foreign body of right upper arm, initial encounter: Secondary | ICD-10-CM | POA: Diagnosis not present

## 2021-05-17 DIAGNOSIS — L97822 Non-pressure chronic ulcer of other part of left lower leg with fat layer exposed: Secondary | ICD-10-CM | POA: Diagnosis not present

## 2021-05-17 DIAGNOSIS — S71111A Laceration without foreign body, right thigh, initial encounter: Secondary | ICD-10-CM | POA: Diagnosis not present

## 2021-05-17 NOTE — Progress Notes (Addendum)
Wells, Gaines I. ([id number]) ?Visit Report for 05/17/2021 ?Chief Complaint Document Details ?Patient Name: Date of Service: ?[name], [name] 05/17/2021 2:15 PM ?Medical Record Number: [id number] ?Patient Account Number: [id number] ?Date of Birth/Sex: Treating RN: ?[birth year] (88 y.o. M) [name], [name] ?Primary Care Provider: [name] Other Clinician: ?Referring Provider: ?Treating Provider/Extender: [name] ?Perini, Mark A ?Weeks in Treatment: 34 ?Information Obtained from: Patient ?Chief Complaint ?Multiple ulcers noted over the upper and lower extremities ?Electronic Signature(s) ?Signed: 05/17/2021 2:29:42 PM By: [name] PA-C ?Entered By: [name] on 05/17/2021 14:29:42 ?-------------------------------------------------------------------------------- ?Debridement Details ?Patient Name: Date of Service: ?[name], [name] 05/17/2021 2:15 PM ?Medical Record Number: [id number] ?Patient Account Number: [id number] ?Date of Birth/Sex: Treating RN: ?1926/10/30 (88 y.o. M) [name], [name] ?Primary Care Provider: [name] Other Clinician: ?Referring Provider: ?Treating Provider/Extender: [name] ?Perini, Mark A ?Weeks in Treatment: 34 ?Debridement Performed for Assessment: Wound #3 Left,Lateral Ankle ?Performed By: Physician [name], PA ?Debridement Type: Debridement ?Severity of Tissue Pre Debridement: Fat layer exposed ?Level of Consciousness (Pre-procedure): Awake and Alert ?Pre-procedure Verification/Time Out Yes - 15:00 ?Taken: ?Start Time: 15:01 ?Pain Control: Lidocaine 5% topical ointment ?T Area Debrided (L x W): ?otal 1.6 (cm) x 1.5 (cm) = 2.4 (cm?) ?Tissue and other material debrided: Viable, Non-Viable, Slough, Subcutaneous, Fibrin/Exudate, Slough ?Level: Skin/Subcutaneous Tissue ?Debridement Description: Excisional ?Instrument: Curette ?Bleeding: Minimum ?Hemostasis Achieved: Pressure ?End Time: 15:09 ?Procedural Pain: 0 ?Post Procedural Pain: 0 ?Response to Treatment:  Procedure was tolerated well ?Level of Consciousness (Post- Awake and Alert ?procedure): ?Post Debridement Measurements of Total Wound ?Length: (cm) 1.6 ?Width: (cm) 1.5 ?Depth: (cm) 0.3 ?Volume: (cm?) 0.565 ?Character of Wound/Ulcer Post Debridement: Improved ?Severity of Tissue Post Debridement: Fat layer exposed ?Post Procedure Diagnosis ?Same as Pre-procedure ?Electronic Signature(s) ?Signed: 05/17/2021 4:18:49 PM By: [name] PA-C ?Signed: 05/17/2021 4:50:23 PM By: Deon Pilling RN, BSN ?Entered By: Deon Pilling on 05/17/2021 15:09:30 ?-------------------------------------------------------------------------------- ?Debridement Details ?Patient Name: ?Date of Service: ?[name], [name] 05/17/2021 2:15 PM ?Medical Record Number: [id number] ?Patient Account Number: [id number] ?Date of Birth/Sex: ?Treating RN: ?February 08, 1926 (88 y.o. M) [name], [name] ?Primary Care Provider: [name] ?Other Clinician: ?Referring Provider: ?Treating Provider/Extender: [name] ?Perini, Mark A ?Weeks in Treatment: 34 ?Debridement Performed for Assessment: Wound #6 Right,Lateral Ankle ?Performed By: Physician [name], PA ?Debridement Type: Debridement ?Severity of Tissue Pre Debridement: Fat layer exposed ?Level of Consciousness (Pre-procedure): Awake and Alert ?Pre-procedure Verification/Time Out Yes - 15:00 ?Taken: ?Start Time: 15:01 ?Pain Control: Lidocaine 5% topical ointment ?T Area Debrided (L x W): ?otal 1.3 (cm) x 0.6 (cm) = 0.78 (cm?) ?Tissue and other material debrided: Viable, Non-Viable, Slough, Subcutaneous, Fibrin/Exudate, Slough ?Level: Skin/Subcutaneous Tissue ?Debridement Description: Excisional ?Instrument: Curette ?Bleeding: Minimum ?Hemostasis Achieved: Pressure ?End Time: 15:09 ?Procedural Pain: 0 ?Post Procedural Pain: 0 ?Response to Treatment: Procedure was tolerated well ?Level of Consciousness (Post- Awake and Alert ?procedure): ?Post Debridement Measurements of Total Wound ?Length: (cm)  1.3 ?Width: (cm) 0.6 ?Depth: (cm) 0.5 ?Volume: (cm?) 0.306 ?Character of Wound/Ulcer Post Debridement: Improved ?Severity of Tissue Post Debridement: Fat layer exposed ?Post Procedure Diagnosis ?Same as Pre-procedure ?Electronic Signature(s) ?Signed: 05/17/2021 4:18:49 PM By: [name] PA-C ?Signed: 05/17/2021 4:50:23 PM By: Deon Pilling RN, BSN ?Entered By: Deon Pilling on 05/17/2021 15:09:50 ?-------------------------------------------------------------------------------- ?HPI Details ?Patient Name: Date of Service: ?[name], [name] 05/17/2021 2:15 PM ?Medical Record Number: [id number] ?Patient Account Number: [id number] ?  Date of Birth/Sex: Treating RN: ?December 23, 1926 (88 y.o. M) [name], [name] ?Primary Care Provider: [name] Other Clinician: ?Referring Provider: ?Treating Provider/Extender: [name] ?Perini, Mark A ?Weeks in Treatment: 34 ?History of Present Illness ?HPI Description: 09/21/2020 upon evaluation today patient appears to be doing somewhat poorly in regard to a wound in the right lateral heel location. This ?fortunately does not appear to be too bad but nonetheless is definitely a spot that we need to work on try to get healed for him. Has been here for quite some ?time. Fortunately there does not appear to be any evidence of active infection at this time which is great news. With that being said I do believe that he has ?some evidence here of peripheral vascular disease this is minimal his ABI 0.87. Other than that he does have a history of BPH, chronic kidney disease stage ?III, and hypertension. Currently he has been using different medication such as Silvadene or antibiotic ointment to try to help treat things. This just is not ?helping out as efficiently as it should be. ?Of note patient's history includes that of a triple bypass in 2010, a kyphoplasty at L1 which was done 8 months ago, unfortunately he had a fracture 2 months ?ago which she sustained a T7/T8 fracture to his spine  which ended up with him being in the hospital. It is in that time since then that he developed this wound ?roughly 2 to 3 months ago is not exactly sure when. He does typically wear compression socks in the 20 to 30 mmHg range. ?09/28/2020 upon evaluation today patient presents for follow-up and overall seems to be doing more poorly based on what I am seeing currently. His ABI last ?time was measured at 0.87 but I am beginning to think this may be falsely elevated due to the fact that there is more eschar and subsequently a concern here ?of vascular compromise that may be part of her issue. I think that we need to avoid any additional sharp debridement and make a referral to vascular surgery ?as soon as possible here. Subsequently also went ahead and did discuss the possibility of starting the patient on Santyl instead of the collagen in order to help ?clean up the surface of the wound this will take the place of sharp debridement currently. ?10/19/2020 upon evaluation today patient's wound actually appears to be doing okay although he has an area on the medial aspect of his heel that is only been ?showing up for the past couple of days this is more of a blister that has ruptured. The good news is there is no deep wound here which is good. Fortunately ?there is no signs of active infection at this time also good news. With that being said however the blood flow was not good with an ABI of 0.67 on the right it ?was around 1 on the left nonetheless I think he does need to see vascular we put that referral in last week on the 12th Dr. Dellia Nims reviewed that for me and ?made that recommendations. Nonetheless the patient has not heard anything from vascular yet as far as getting this scheduled. I think this needs to be done ?sooner rather than later as I am concerned about the possibility of a limb threatening situation if we do not get this under control.. ?11/16/2020 upon evaluation today patient presents for follow-up  regarding issues that he has been having with his bilateral lower extremities in particular. He in ?fact still has  an issue with his right heel. This appears to be a stage III pressure ulcer at this point t

## 2021-05-17 NOTE — Progress Notes (Signed)
Staples, Johnross I. ([id number]) ?Visit Report for 05/17/2021 ?Arrival Information Details ?Patient Name: Date of Service: ?[name], [name] 05/17/2021 2:15 PM ?Medical Record Number: [id number] ?Patient Account Number: [id number] ?Date of Birth/Sex: Treating RN: ?19-Feb-1926 (88 y.o. M) [name], [name] ?Primary Care Rim Thatch: [name] Other Clinician: ?Referring Christain Niznik: ?Treating Sahvanna Mcmanigal/Extender: [name] ?Perini, Mark A ?Weeks in Treatment: 34 ?Visit Information History Since Last Visit ?Added or deleted any medications: No ?Patient Arrived: Wheel Chair ?Any new allergies or adverse reactions: No ?Arrival Time: 14:28 ?Had a fall or experienced change in No ?Accompanied By: wife ?activities of daily living that may affect ?Transfer Assistance: Manual ?risk of falls: ?Patient Identification Verified: Yes ?Signs or symptoms of abuse/neglect since last visito No ?Secondary Verification Process Completed: Yes ?Hospitalized since last visit: No ?Patient Requires Transmission-Based Precautions: No ?Implantable device outside of the clinic excluding No ?Patient Has Alerts: Yes ?cellular tissue based products placed in the center ?Patient Alerts: Patient on Blood Thinner since last visit: ?Has Dressing in Place as Prescribed: Yes ?Pain Present Now: Yes ?Electronic Signature(s) ?Signed: 05/17/2021 4:50:23 PM By: Deon Pilling RN, BSN ?Entered By: Deon Pilling on 05/17/2021 14:32:36 ?-------------------------------------------------------------------------------- ?Encounter Discharge Information Details ?Patient Name: Date of Service: ?[name], [name] 05/17/2021 2:15 PM ?Medical Record Number: [id number] ?Patient Account Number: [id number] ?Date of Birth/Sex: Treating RN: ?1926-05-10 (88 y.o. M) [name], [name] ?Primary Care Peterson Mathey: [name] Other Clinician: ?Referring Mickal Meno: ?Treating Jorma Tassinari/Extender: [name] ?Perini, Mark A ?Weeks in Treatment: 34 ?Encounter Discharge Information Items Post Procedure  Vitals ?Discharge Condition: Stable ?Temperature (F): 98.1 ?Ambulatory Status: Wheelchair ?Pulse (bpm): 56 ?Discharge Destination: Home ?Respiratory Rate (breaths/min): 16 ?Transportation: Private Auto ?Blood Pressure (mmHg): 128/61 ?Accompanied By: wife ?Schedule Follow-up Appointment: Yes ?Clinical Summary of Care: ?Electronic Signature(s) ?Signed: 05/17/2021 4:50:23 PM By: Deon Pilling RN, BSN ?Entered By: Deon Pilling on 05/17/2021 15:10:54 ?-------------------------------------------------------------------------------- ?Lower Extremity Assessment Details ?Patient Name: ?Date of Service: ?[name], [name] 05/17/2021 2:15 PM ?Medical Record Number: [id number] ?Patient Account Number: [id number] ?Date of Birth/Sex: ?Treating RN: ?[birth year] (88 y.o. M) [name], [name] ?Primary Care Adeola Dennen: [name] ?Other Clinician: ?Referring Theone Bowell: ?Treating Holt Woolbright/Extender: [name] ?Perini, Mark A ?Weeks in Treatment: 34 ?Edema Assessment ?Assessed: [Left: Yes] [Right: Yes] ?Edema: [Left: Yes] [Right: Yes] ?Calf ?Left: Right: ?Point of Measurement: 34 cm From Medial Instep 47 cm 42.5 cm ?Ankle ?Left: Right: ?Point of Measurement: 11 cm From Medial Instep 27 cm 30 cm ?Vascular Assessment ?Pulses: ?Dorsalis Pedis ?Palpable: [Left:Yes] [Right:Yes] ?Electronic Signature(s) ?Signed: 05/17/2021 4:50:23 PM By: Deon Pilling RN, BSN ?Entered By: Deon Pilling on 05/17/2021 14:35:07 ?-------------------------------------------------------------------------------- ?Multi-Disciplinary Care Plan Details ?Patient Name: ?Date of Service: ?[name], [name] 05/17/2021 2:15 PM ?Medical Record Number: [id number] ?Patient Account Number: [id number] ?Date of Birth/Sex: ?Treating RN: ?[birth year] (88 y.o. M) [name], [name] ?Primary Care Kailana Benninger: [name] ?Other Clinician: ?Referring Arriyah Madej: ?Treating Deon Ivey/Extender: [name] ?Perini, Mark A ?Weeks in Treatment: 34 ?Active Inactive ?Abuse / Safety / Falls / Self Care  Management ?Nursing Diagnoses: ?Potential for falls ?Goals: ?Patient will remain injury free related to falls ?Date Initiated: 03/22/2021 ?Target Resolution Date: 06/02/2021 ?Goal Status: Active ?Interventions: ?Assess: immobility, friction, shearing, incontinence upon admission and as needed ?Assess impairment of mobility on admission and as needed per policy ?Assess self care needs on admission and as needed ?Provide education on fall prevention ?Notes: ?Nutrition ?Nursing Diagnoses: ?Potential for alteratiion in Nutrition/Potential for imbalanced nutrition ?Goals: ?Patient/caregiver agrees  to and verbalizes understanding of need to obtain nutritional consultation ?Date Initiated: 03/22/2021 ?T arget Resolution Date: 06/16/2021 ?Goal Status: Active ?Interventions: ?Provide education on nutrition ?Treatment Activities: ?Education provided on Nutrition : 05/10/2021 ?Giving encouragement to exercise : 03/22/2021 ?Notes: ?Electronic Signature(s) ?Signed: 05/17/2021 4:50:23 PM By: Deon Pilling RN, BSN ?Entered By: Deon Pilling on 05/17/2021 14:44:14 ?-------------------------------------------------------------------------------- ?Pain Assessment Details ?Patient Name: ?Date of Service: ?[name], [name] 05/17/2021 2:15 PM ?Medical Record Number: [id number] ?Patient Account Number: [id number] ?Date of Birth/Sex: ?Treating RN: ?1926/11/21 (88 y.o. M) [name], [name] ?Primary Care Jairen Goldfarb: [name] ?Other Clinician: ?Referring Maylee Bare: ?Treating Marca Gadsby/Extender: [name] ?Perini, Mark A ?Weeks in Treatment: 34 ?Active Problems ?Location of Pain Severity and Description of Pain ?Patient Has Paino Yes ?Site Locations ?Pain Location: ?Generalized Pain ?Rate the pain. ?Current Pain Level: 6 ?Pain Management and Medication ?Current Pain Management: ?Medication: No ?Cold Application: No ?Rest: No ?Massage: No ?Activity: No ?T.E.N.S.: No ?Heat Application: No ?Leg drop or elevation: No ?Is the Current Pain Management  Adequate: Adequate ?How does your wound impact your activities of daily livingo ?Sleep: No ?Bathing: No ?Appetite: No ?Relationship With Others: No ?Bladder Continence: No ?Emotions: No ?Bowel Continence: No ?Work: No ?Toileting: No ?Drive: No ?Dressing: No ?Hobbies: No ?Notes ?pain back per patient. ?Electronic Signature(s) ?Signed: 05/17/2021 4:50:23 PM By: Deon Pilling RN, BSN ?Entered By: Deon Pilling on 05/17/2021 14:33:05 ?-------------------------------------------------------------------------------- ?Patient/Caregiver Education Details ?Patient Name: Date of Service: ?[name], [name] 5/17/2023andnbsp2:15 PM ?Medical Record Number: [id number] ?Patient Account Number: [id number] ?Date of Birth/Gender: Treating RN: ?Apr 22, 1926 (88 y.o. M) [name], [name] ?Primary Care Physician: [name] Other Clinician: ?Referring Physician: ?Treating Physician/Extender: [name] ?Perini, Mark A ?Weeks in Treatment: 34 ?Education Assessment ?Education Provided To: ?Patient and Caregiver ?Education Topics Provided ?Wound/Skin Impairment: ?Handouts: Skin Care Do's and Dont's ?Methods: Explain/Verbal ?Responses: Reinforcements needed ?Electronic Signature(s) ?Signed: 05/17/2021 4:50:23 PM By: Deon Pilling RN, BSN ?Entered By: Deon Pilling on 05/17/2021 14:44:29 ?-------------------------------------------------------------------------------- ?Wound Assessment Details ?Patient Name: Date of Service: ?[name], [name] 05/17/2021 2:15 PM ?Medical Record Number: [id number] ?Patient Account Number: [id number] ?Date of Birth/Sex: Treating RN: ?1926/07/20 (88 y.o. M) [name], [name] ?Primary Care Blasa Raisch: [name] Other Clinician: ?Referring Shanta Hartner: ?Treating Sherlynn Tourville/Extender: [name] ?Perini, Mark A ?Weeks in Treatment: 34 ?Wound Status ?Wound Number: 10 Primary Trauma, Other ?Etiology: ?Wound Location: Right, Anterior Upper Leg ?Wound Open ?Wounding Event: Trauma ?Status: ?Date Acquired: 04/29/2021 ?Comorbid  Cataracts, Sleep Apnea, Arrhythmia, Congestive Heart Failure, ?Weeks Of Treatment: 2 ?History: Coronary Artery Disease, Hypertension, Myocardial Infarction, ?Clustered Wound: No End Stage Renal Disease, Gou

## 2021-05-18 ENCOUNTER — Other Ambulatory Visit: Payer: Self-pay | Admitting: *Deleted

## 2021-05-18 DIAGNOSIS — I129 Hypertensive chronic kidney disease with stage 1 through stage 4 chronic kidney disease, or unspecified chronic kidney disease: Secondary | ICD-10-CM | POA: Diagnosis not present

## 2021-05-18 DIAGNOSIS — M353 Polymyalgia rheumatica: Secondary | ICD-10-CM | POA: Diagnosis not present

## 2021-05-18 DIAGNOSIS — N1831 Chronic kidney disease, stage 3a: Secondary | ICD-10-CM | POA: Diagnosis not present

## 2021-05-18 DIAGNOSIS — I890 Lymphedema, not elsewhere classified: Secondary | ICD-10-CM

## 2021-05-18 DIAGNOSIS — D649 Anemia, unspecified: Secondary | ICD-10-CM | POA: Diagnosis not present

## 2021-05-18 DIAGNOSIS — I739 Peripheral vascular disease, unspecified: Secondary | ICD-10-CM

## 2021-05-20 ENCOUNTER — Other Ambulatory Visit: Payer: Self-pay | Admitting: Cardiology

## 2021-05-22 DIAGNOSIS — C44729 Squamous cell carcinoma of skin of left lower limb, including hip: Secondary | ICD-10-CM | POA: Diagnosis not present

## 2021-05-22 DIAGNOSIS — I872 Venous insufficiency (chronic) (peripheral): Secondary | ICD-10-CM | POA: Diagnosis not present

## 2021-05-23 DIAGNOSIS — N3001 Acute cystitis with hematuria: Secondary | ICD-10-CM | POA: Diagnosis not present

## 2021-05-23 DIAGNOSIS — M353 Polymyalgia rheumatica: Secondary | ICD-10-CM | POA: Diagnosis not present

## 2021-05-23 DIAGNOSIS — M810 Age-related osteoporosis without current pathological fracture: Secondary | ICD-10-CM | POA: Diagnosis not present

## 2021-05-23 DIAGNOSIS — M5440 Lumbago with sciatica, unspecified side: Secondary | ICD-10-CM | POA: Diagnosis not present

## 2021-05-23 DIAGNOSIS — N390 Urinary tract infection, site not specified: Secondary | ICD-10-CM | POA: Diagnosis not present

## 2021-05-24 ENCOUNTER — Other Ambulatory Visit: Payer: Self-pay | Admitting: Physical Medicine and Rehabilitation

## 2021-05-24 DIAGNOSIS — M5416 Radiculopathy, lumbar region: Secondary | ICD-10-CM

## 2021-05-24 NOTE — Progress Notes (Signed)
Spoke with patients wife [name]) regarding referral for injection from Dr. Nelva Bush. Patient is able to stand up and pivot to bed with assistance. I have placed order for bilateral L5 transforaminal epidural steroid injection.

## 2021-05-25 ENCOUNTER — Inpatient Hospital Stay (HOSPITAL_COMMUNITY)
Admission: EM | Admit: 2021-05-25 | Discharge: 2021-05-31 | DRG: 064 | Disposition: A | Discharge status: Rehabilitation Facility | Payer: Medicare Other | Attending: Internal Medicine | Admitting: Internal Medicine

## 2021-05-25 ENCOUNTER — Inpatient Hospital Stay (HOSPITAL_COMMUNITY): Payer: Medicare Other

## 2021-05-25 ENCOUNTER — Emergency Department (HOSPITAL_COMMUNITY): Payer: Medicare Other

## 2021-05-25 ENCOUNTER — Other Ambulatory Visit: Payer: Self-pay

## 2021-05-25 ENCOUNTER — Encounter (HOSPITAL_COMMUNITY): Payer: Self-pay | Admitting: Emergency Medicine

## 2021-05-25 ENCOUNTER — Inpatient Hospital Stay: Payer: Medicare Other

## 2021-05-25 ENCOUNTER — Other Ambulatory Visit (HOSPITAL_COMMUNITY): Payer: Medicare Other

## 2021-05-25 DIAGNOSIS — R7303 Prediabetes: Secondary | ICD-10-CM | POA: Diagnosis present

## 2021-05-25 DIAGNOSIS — G4733 Obstructive sleep apnea (adult) (pediatric): Secondary | ICD-10-CM | POA: Diagnosis present

## 2021-05-25 DIAGNOSIS — I2510 Atherosclerotic heart disease of native coronary artery without angina pectoris: Secondary | ICD-10-CM | POA: Diagnosis present

## 2021-05-25 DIAGNOSIS — I890 Lymphedema, not elsewhere classified: Secondary | ICD-10-CM | POA: Diagnosis present

## 2021-05-25 DIAGNOSIS — I5022 Chronic systolic (congestive) heart failure: Secondary | ICD-10-CM | POA: Diagnosis not present

## 2021-05-25 DIAGNOSIS — Z952 Presence of prosthetic heart valve: Secondary | ICD-10-CM

## 2021-05-25 DIAGNOSIS — Z9049 Acquired absence of other specified parts of digestive tract: Secondary | ICD-10-CM

## 2021-05-25 DIAGNOSIS — I714 Abdominal aortic aneurysm, without rupture, unspecified: Secondary | ICD-10-CM | POA: Diagnosis not present

## 2021-05-25 DIAGNOSIS — I63512 Cerebral infarction due to unspecified occlusion or stenosis of left middle cerebral artery: Secondary | ICD-10-CM | POA: Diagnosis not present

## 2021-05-25 DIAGNOSIS — I482 Chronic atrial fibrillation, unspecified: Secondary | ICD-10-CM | POA: Diagnosis not present

## 2021-05-25 DIAGNOSIS — F0390 Unspecified dementia without behavioral disturbance: Secondary | ICD-10-CM | POA: Diagnosis present

## 2021-05-25 DIAGNOSIS — M353 Polymyalgia rheumatica: Secondary | ICD-10-CM | POA: Diagnosis not present

## 2021-05-25 DIAGNOSIS — M1990 Unspecified osteoarthritis, unspecified site: Secondary | ICD-10-CM | POA: Diagnosis present

## 2021-05-25 DIAGNOSIS — I69351 Hemiplegia and hemiparesis following cerebral infarction affecting right dominant side: Secondary | ICD-10-CM | POA: Diagnosis not present

## 2021-05-25 DIAGNOSIS — L8990 Pressure ulcer of unspecified site, unspecified stage: Secondary | ICD-10-CM | POA: Diagnosis present

## 2021-05-25 DIAGNOSIS — R319 Hematuria, unspecified: Secondary | ICD-10-CM | POA: Diagnosis not present

## 2021-05-25 DIAGNOSIS — E611 Iron deficiency: Secondary | ICD-10-CM | POA: Diagnosis present

## 2021-05-25 DIAGNOSIS — R29701 NIHSS score 1: Secondary | ICD-10-CM | POA: Diagnosis not present

## 2021-05-25 DIAGNOSIS — H539 Unspecified visual disturbance: Secondary | ICD-10-CM | POA: Diagnosis not present

## 2021-05-25 DIAGNOSIS — L89513 Pressure ulcer of right ankle, stage 3: Secondary | ICD-10-CM | POA: Diagnosis not present

## 2021-05-25 DIAGNOSIS — L89613 Pressure ulcer of right heel, stage 3: Secondary | ICD-10-CM | POA: Diagnosis not present

## 2021-05-25 DIAGNOSIS — D631 Anemia in chronic kidney disease: Secondary | ICD-10-CM | POA: Diagnosis present

## 2021-05-25 DIAGNOSIS — K219 Gastro-esophageal reflux disease without esophagitis: Secondary | ICD-10-CM | POA: Diagnosis present

## 2021-05-25 DIAGNOSIS — Z87891 Personal history of nicotine dependence: Secondary | ICD-10-CM

## 2021-05-25 DIAGNOSIS — I63412 Cerebral infarction due to embolism of left middle cerebral artery: Secondary | ICD-10-CM | POA: Diagnosis not present

## 2021-05-25 DIAGNOSIS — S91302A Unspecified open wound, left foot, initial encounter: Secondary | ICD-10-CM | POA: Diagnosis not present

## 2021-05-25 DIAGNOSIS — I259 Chronic ischemic heart disease, unspecified: Secondary | ICD-10-CM | POA: Diagnosis not present

## 2021-05-25 DIAGNOSIS — I480 Paroxysmal atrial fibrillation: Secondary | ICD-10-CM | POA: Diagnosis present

## 2021-05-25 DIAGNOSIS — N400 Enlarged prostate without lower urinary tract symptoms: Secondary | ICD-10-CM | POA: Diagnosis present

## 2021-05-25 DIAGNOSIS — Z7901 Long term (current) use of anticoagulants: Secondary | ICD-10-CM

## 2021-05-25 DIAGNOSIS — I69390 Apraxia following cerebral infarction: Secondary | ICD-10-CM | POA: Diagnosis not present

## 2021-05-25 DIAGNOSIS — L97821 Non-pressure chronic ulcer of other part of left lower leg limited to breakdown of skin: Secondary | ICD-10-CM | POA: Diagnosis not present

## 2021-05-25 DIAGNOSIS — I5021 Acute systolic (congestive) heart failure: Secondary | ICD-10-CM | POA: Diagnosis not present

## 2021-05-25 DIAGNOSIS — I5032 Chronic diastolic (congestive) heart failure: Secondary | ICD-10-CM | POA: Diagnosis not present

## 2021-05-25 DIAGNOSIS — Z6830 Body mass index (BMI) 30.0-30.9, adult: Secondary | ICD-10-CM

## 2021-05-25 DIAGNOSIS — D649 Anemia, unspecified: Secondary | ICD-10-CM | POA: Diagnosis not present

## 2021-05-25 DIAGNOSIS — I129 Hypertensive chronic kidney disease with stage 1 through stage 4 chronic kidney disease, or unspecified chronic kidney disease: Secondary | ICD-10-CM | POA: Diagnosis not present

## 2021-05-25 DIAGNOSIS — G8191 Hemiplegia, unspecified affecting right dominant side: Secondary | ICD-10-CM | POA: Diagnosis present

## 2021-05-25 DIAGNOSIS — E669 Obesity, unspecified: Secondary | ICD-10-CM | POA: Diagnosis present

## 2021-05-25 DIAGNOSIS — M109 Gout, unspecified: Secondary | ICD-10-CM | POA: Diagnosis present

## 2021-05-25 DIAGNOSIS — Z823 Family history of stroke: Secondary | ICD-10-CM

## 2021-05-25 DIAGNOSIS — G459 Transient cerebral ischemic attack, unspecified: Secondary | ICD-10-CM | POA: Diagnosis not present

## 2021-05-25 DIAGNOSIS — R471 Dysarthria and anarthria: Secondary | ICD-10-CM | POA: Diagnosis present

## 2021-05-25 DIAGNOSIS — N1831 Chronic kidney disease, stage 3a: Secondary | ICD-10-CM | POA: Diagnosis not present

## 2021-05-25 DIAGNOSIS — Z818 Family history of other mental and behavioral disorders: Secondary | ICD-10-CM

## 2021-05-25 DIAGNOSIS — I69320 Aphasia following cerebral infarction: Secondary | ICD-10-CM | POA: Diagnosis not present

## 2021-05-25 DIAGNOSIS — Z9862 Peripheral vascular angioplasty status: Secondary | ICD-10-CM

## 2021-05-25 DIAGNOSIS — F03A0 Unspecified dementia, mild, without behavioral disturbance, psychotic disturbance, mood disturbance, and anxiety: Secondary | ICD-10-CM | POA: Diagnosis present

## 2021-05-25 DIAGNOSIS — E785 Hyperlipidemia, unspecified: Secondary | ICD-10-CM | POA: Diagnosis not present

## 2021-05-25 DIAGNOSIS — I4891 Unspecified atrial fibrillation: Secondary | ICD-10-CM | POA: Diagnosis not present

## 2021-05-25 DIAGNOSIS — R4701 Aphasia: Secondary | ICD-10-CM | POA: Diagnosis not present

## 2021-05-25 DIAGNOSIS — I130 Hypertensive heart and chronic kidney disease with heart failure and stage 1 through stage 4 chronic kidney disease, or unspecified chronic kidney disease: Secondary | ICD-10-CM | POA: Diagnosis present

## 2021-05-25 DIAGNOSIS — I6523 Occlusion and stenosis of bilateral carotid arteries: Secondary | ICD-10-CM | POA: Diagnosis not present

## 2021-05-25 DIAGNOSIS — E876 Hypokalemia: Secondary | ICD-10-CM | POA: Diagnosis not present

## 2021-05-25 DIAGNOSIS — Z7952 Long term (current) use of systemic steroids: Secondary | ICD-10-CM

## 2021-05-25 DIAGNOSIS — N183 Chronic kidney disease, stage 3 unspecified: Secondary | ICD-10-CM | POA: Diagnosis present

## 2021-05-25 DIAGNOSIS — I70201 Unspecified atherosclerosis of native arteries of extremities, right leg: Secondary | ICD-10-CM | POA: Diagnosis not present

## 2021-05-25 DIAGNOSIS — I639 Cerebral infarction, unspecified: Secondary | ICD-10-CM | POA: Diagnosis not present

## 2021-05-25 DIAGNOSIS — N3941 Urge incontinence: Secondary | ICD-10-CM | POA: Diagnosis not present

## 2021-05-25 DIAGNOSIS — I10 Essential (primary) hypertension: Secondary | ICD-10-CM | POA: Diagnosis present

## 2021-05-25 DIAGNOSIS — Z953 Presence of xenogenic heart valve: Secondary | ICD-10-CM | POA: Diagnosis not present

## 2021-05-25 DIAGNOSIS — S81802D Unspecified open wound, left lower leg, subsequent encounter: Secondary | ICD-10-CM | POA: Diagnosis not present

## 2021-05-25 DIAGNOSIS — Z87442 Personal history of urinary calculi: Secondary | ICD-10-CM

## 2021-05-25 DIAGNOSIS — H409 Unspecified glaucoma: Secondary | ICD-10-CM | POA: Diagnosis present

## 2021-05-25 DIAGNOSIS — N390 Urinary tract infection, site not specified: Secondary | ICD-10-CM | POA: Diagnosis present

## 2021-05-25 DIAGNOSIS — N1832 Chronic kidney disease, stage 3b: Secondary | ICD-10-CM | POA: Diagnosis not present

## 2021-05-25 DIAGNOSIS — Z951 Presence of aortocoronary bypass graft: Secondary | ICD-10-CM | POA: Diagnosis not present

## 2021-05-25 DIAGNOSIS — I252 Old myocardial infarction: Secondary | ICD-10-CM

## 2021-05-25 DIAGNOSIS — Z8679 Personal history of other diseases of the circulatory system: Secondary | ICD-10-CM

## 2021-05-25 DIAGNOSIS — D500 Iron deficiency anemia secondary to blood loss (chronic): Secondary | ICD-10-CM

## 2021-05-25 DIAGNOSIS — N189 Chronic kidney disease, unspecified: Secondary | ICD-10-CM | POA: Diagnosis not present

## 2021-05-25 DIAGNOSIS — I69398 Other sequelae of cerebral infarction: Secondary | ICD-10-CM | POA: Diagnosis not present

## 2021-05-25 DIAGNOSIS — Z885 Allergy status to narcotic agent status: Secondary | ICD-10-CM

## 2021-05-25 DIAGNOSIS — M069 Rheumatoid arthritis, unspecified: Secondary | ICD-10-CM | POA: Diagnosis present

## 2021-05-25 DIAGNOSIS — Z888 Allergy status to other drugs, medicaments and biological substances status: Secondary | ICD-10-CM

## 2021-05-25 DIAGNOSIS — Z79899 Other long term (current) drug therapy: Secondary | ICD-10-CM

## 2021-05-25 DIAGNOSIS — Z880 Allergy status to penicillin: Secondary | ICD-10-CM

## 2021-05-25 DIAGNOSIS — Z9079 Acquired absence of other genital organ(s): Secondary | ICD-10-CM

## 2021-05-25 DIAGNOSIS — I6389 Other cerebral infarction: Secondary | ICD-10-CM | POA: Diagnosis not present

## 2021-05-25 DIAGNOSIS — I872 Venous insufficiency (chronic) (peripheral): Secondary | ICD-10-CM | POA: Diagnosis not present

## 2021-05-25 DIAGNOSIS — R310 Gross hematuria: Secondary | ICD-10-CM | POA: Diagnosis not present

## 2021-05-25 DIAGNOSIS — Z66 Do not resuscitate: Secondary | ICD-10-CM | POA: Diagnosis not present

## 2021-05-25 DIAGNOSIS — Z7982 Long term (current) use of aspirin: Secondary | ICD-10-CM

## 2021-05-25 LAB — DIFFERENTIAL
Abs Immature Granulocytes: 0.02 10*3/uL (ref 0.00–0.07)
Basophils Absolute: 0 10*3/uL (ref 0.0–0.1)
Basophils Relative: 1 %
Eosinophils Absolute: 0.2 10*3/uL (ref 0.0–0.5)
Eosinophils Relative: 3 %
Immature Granulocytes: 0 %
Lymphocytes Relative: 20 %
Lymphs Abs: 1.1 10*3/uL (ref 0.7–4.0)
Monocytes Absolute: 0.6 10*3/uL (ref 0.1–1.0)
Monocytes Relative: 12 %
Neutro Abs: 3.6 10*3/uL (ref 1.7–7.7)
Neutrophils Relative %: 64 %

## 2021-05-25 LAB — CBC
HCT: 31.1 % — ABNORMAL LOW (ref 39.0–52.0)
Hemoglobin: 9.9 g/dL — ABNORMAL LOW (ref 13.0–17.0)
MCH: 30 pg (ref 26.0–34.0)
MCHC: 31.8 g/dL (ref 30.0–36.0)
MCV: 94.2 fL (ref 80.0–100.0)
Platelets: 123 10*3/uL — ABNORMAL LOW (ref 150–400)
RBC: 3.3 MIL/uL — ABNORMAL LOW (ref 4.22–5.81)
RDW: 18 % — ABNORMAL HIGH (ref 11.5–15.5)
WBC: 5.6 10*3/uL (ref 4.0–10.5)
nRBC: 0 % (ref 0.0–0.2)

## 2021-05-25 LAB — HEMOGLOBIN A1C
Hgb A1c MFr Bld: 5.8 % — ABNORMAL HIGH (ref 4.8–5.6)
Mean Plasma Glucose: 119.76 mg/dL

## 2021-05-25 LAB — LIPID PANEL
Cholesterol: 224 mg/dL — ABNORMAL HIGH (ref 0–200)
HDL: 73 mg/dL (ref >40)
LDL Cholesterol: 122 mg/dL — ABNORMAL HIGH (ref 0–99)
Total CHOL/HDL Ratio: 3.1 RATIO
Triglycerides: 146 mg/dL (ref <150)
VLDL: 29 mg/dL (ref 0–40)

## 2021-05-25 LAB — I-STAT CHEM 8, ED
BUN: 30 mg/dL — ABNORMAL HIGH (ref 8–23)
Calcium, Ion: 1.07 mmol/L — ABNORMAL LOW (ref 1.15–1.40)
Chloride: 102 mmol/L (ref 98–111)
Creatinine, Ser: 1.7 mg/dL — ABNORMAL HIGH (ref 0.61–1.24)
Glucose, Bld: 111 mg/dL — ABNORMAL HIGH (ref 70–99)
HCT: 31 % — ABNORMAL LOW (ref 39.0–52.0)
Hemoglobin: 10.5 g/dL — ABNORMAL LOW (ref 13.0–17.0)
Potassium: 3.4 mmol/L — ABNORMAL LOW (ref 3.5–5.1)
Sodium: 138 mmol/L (ref 135–145)
TCO2: 28 mmol/L (ref 22–32)

## 2021-05-25 LAB — COMPREHENSIVE METABOLIC PANEL
ALT: 20 U/L (ref 0–44)
AST: 27 U/L (ref 15–41)
Albumin: 3.3 g/dL — ABNORMAL LOW (ref 3.5–5.0)
Alkaline Phosphatase: 94 U/L (ref 38–126)
Anion gap: 9 (ref 5–15)
BUN: 26 mg/dL — ABNORMAL HIGH (ref 8–23)
CO2: 27 mmol/L (ref 22–32)
Calcium: 10 mg/dL (ref 8.9–10.3)
Chloride: 104 mmol/L (ref 98–111)
Creatinine, Ser: 1.71 mg/dL — ABNORMAL HIGH (ref 0.61–1.24)
GFR, Estimated: 37 mL/min — ABNORMAL LOW (ref >60)
Glucose, Bld: 115 mg/dL — ABNORMAL HIGH (ref 70–99)
Potassium: 3.5 mmol/L (ref 3.5–5.1)
Sodium: 140 mmol/L (ref 135–145)
Total Bilirubin: 0.6 mg/dL (ref 0.3–1.2)
Total Protein: 6.8 g/dL (ref 6.5–8.1)

## 2021-05-25 LAB — CBG MONITORING, ED: Glucose-Capillary: 139 mg/dL — ABNORMAL HIGH (ref 70–99)

## 2021-05-25 LAB — PROTIME-INR
INR: 1.6 — ABNORMAL HIGH (ref 0.8–1.2)
Prothrombin Time: 18.8 seconds — ABNORMAL HIGH (ref 11.4–15.2)

## 2021-05-25 LAB — APTT: aPTT: 40 seconds — ABNORMAL HIGH (ref 24–36)

## 2021-05-25 MED ORDER — RIVAROXABAN 15 MG PO TABS
15.0000 mg | ORAL_TABLET | Freq: Every day | ORAL | Status: DC
  Administered 2021-05-25 – 2021-05-26 (×2): 15 mg via ORAL
  Filled 2021-05-25 (×2): qty 1

## 2021-05-25 MED ORDER — LEFLUNOMIDE 20 MG PO TABS
10.0000 mg | ORAL_TABLET | Freq: Every day | ORAL | Status: DC
  Administered 2021-05-26 – 2021-05-31 (×6): 10 mg via ORAL
  Filled 2021-05-25 (×7): qty 0.5

## 2021-05-25 MED ORDER — ASPIRIN 81 MG PO CHEW
81.0000 mg | CHEWABLE_TABLET | Freq: Every day | ORAL | Status: DC
  Administered 2021-05-26 – 2021-05-29 (×3): 81 mg via ORAL
  Filled 2021-05-25 (×5): qty 1

## 2021-05-25 MED ORDER — PREDNISONE 5 MG PO TABS
5.0000 mg | ORAL_TABLET | Freq: Every day | ORAL | Status: DC
  Administered 2021-05-26 – 2021-05-30 (×5): 5 mg via ORAL
  Filled 2021-05-25 (×5): qty 1

## 2021-05-25 MED ORDER — GABAPENTIN 100 MG PO CAPS: 100.0000 mg | ORAL_CAPSULE | ORAL | Status: DC

## 2021-05-25 MED ORDER — ISOSORBIDE MONONITRATE ER 30 MG PO TB24
60.0000 mg | ORAL_TABLET | Freq: Every day | ORAL | Status: DC
  Administered 2021-05-26 – 2021-05-30 (×5): 60 mg via ORAL
  Filled 2021-05-25 (×5): qty 2

## 2021-05-25 MED ORDER — ACETAMINOPHEN 325 MG PO TABS
650.0000 mg | ORAL_TABLET | ORAL | Status: DC | PRN
  Administered 2021-05-26 – 2021-05-27 (×3): 650 mg via ORAL
  Filled 2021-05-25 (×3): qty 2

## 2021-05-25 MED ORDER — SODIUM CHLORIDE 0.9% FLUSH: 3.0000 mL | Freq: Once | INTRAVENOUS | Status: DC

## 2021-05-25 MED ORDER — ASPIRIN 300 MG RE SUPP: 300.0000 mg | Freq: Every day | RECTAL | Status: DC

## 2021-05-25 MED ORDER — EZETIMIBE 10 MG PO TABS
10.0000 mg | ORAL_TABLET | Freq: Every day | ORAL | Status: DC
  Administered 2021-05-26 – 2021-05-31 (×6): 10 mg via ORAL
  Filled 2021-05-25 (×6): qty 1

## 2021-05-25 MED ORDER — PANTOPRAZOLE SODIUM 40 MG PO TBEC
40.0000 mg | DELAYED_RELEASE_TABLET | ORAL | Status: DC
  Administered 2021-05-27 – 2021-05-31 (×3): 40 mg via ORAL
  Filled 2021-05-25 (×3): qty 1

## 2021-05-25 MED ORDER — CEFDINIR 300 MG PO CAPS
300.0000 mg | ORAL_CAPSULE | Freq: Two times a day (BID) | ORAL | Status: AC
  Administered 2021-05-25 – 2021-05-30 (×11): 300 mg via ORAL
  Filled 2021-05-25 (×11): qty 1

## 2021-05-25 MED ORDER — IOHEXOL 350 MG/ML SOLN
80.0000 mL | Freq: Once | INTRAVENOUS | Status: AC | PRN
  Administered 2021-05-25: 80 mL via INTRAVENOUS

## 2021-05-25 MED ORDER — ACETAMINOPHEN 650 MG RE SUPP: 650.0000 mg | RECTAL | Status: DC | PRN

## 2021-05-25 MED ORDER — ASPIRIN 325 MG PO TABS: 325.0000 mg | ORAL_TABLET | Freq: Every day | ORAL | Status: DC

## 2021-05-25 MED ORDER — STROKE: EARLY STAGES OF RECOVERY BOOK: Freq: Once | Status: AC

## 2021-05-25 MED ORDER — GABAPENTIN 100 MG PO CAPS
100.0000 mg | ORAL_CAPSULE | Freq: Every evening | ORAL | Status: DC
  Administered 2021-05-25 – 2021-05-30 (×6): 100 mg via ORAL
  Filled 2021-05-25 (×6): qty 1

## 2021-05-25 MED ORDER — FINASTERIDE 5 MG PO TABS
5.0000 mg | ORAL_TABLET | Freq: Every day | ORAL | Status: DC
  Administered 2021-05-26 – 2021-05-31 (×6): 5 mg via ORAL
  Filled 2021-05-25 (×6): qty 1

## 2021-05-25 MED ORDER — HYDRALAZINE HCL 20 MG/ML IJ SOLN: 5.0000 mg | INTRAMUSCULAR | Status: DC | PRN

## 2021-05-25 MED ORDER — GALANTAMINE HYDROBROMIDE ER 8 MG PO CP24
8.0000 mg | ORAL_CAPSULE | Freq: Every day | ORAL | Status: DC
  Administered 2021-05-26 – 2021-05-31 (×6): 8 mg via ORAL
  Filled 2021-05-25 (×6): qty 1

## 2021-05-25 MED ORDER — ATORVASTATIN CALCIUM 40 MG PO TABS
40.0000 mg | ORAL_TABLET | Freq: Every day | ORAL | Status: DC
  Administered 2021-05-25 – 2021-05-31 (×7): 40 mg via ORAL
  Filled 2021-05-25 (×7): qty 1

## 2021-05-25 MED ORDER — AMIODARONE HCL 200 MG PO TABS
200.0000 mg | ORAL_TABLET | Freq: Every day | ORAL | Status: DC
  Administered 2021-05-26 – 2021-05-31 (×6): 200 mg via ORAL
  Filled 2021-05-25 (×7): qty 1

## 2021-05-25 MED ORDER — GABAPENTIN 100 MG PO CAPS
200.0000 mg | ORAL_CAPSULE | Freq: Two times a day (BID) | ORAL | Status: DC
  Administered 2021-05-25 – 2021-05-31 (×12): 200 mg via ORAL
  Filled 2021-05-25 (×12): qty 2

## 2021-05-25 MED ORDER — MEMANTINE HCL 10 MG PO TABS
10.0000 mg | ORAL_TABLET | Freq: Two times a day (BID) | ORAL | Status: DC
  Administered 2021-05-25 – 2021-05-31 (×12): 10 mg via ORAL
  Filled 2021-05-25 (×12): qty 1

## 2021-05-25 MED ORDER — SENNOSIDES-DOCUSATE SODIUM 8.6-50 MG PO TABS: 1.0000 | ORAL_TABLET | Freq: Every evening | ORAL | Status: DC | PRN

## 2021-05-25 MED ORDER — ACETAMINOPHEN 160 MG/5ML PO SOLN: 650.0000 mg | ORAL | Status: DC | PRN

## 2021-05-25 NOTE — H&P (Signed)
History and Physical    Patient: [name] [name] PFX:[id number] DOB: January 16, 1926 DOA: 05/25/2021 DOS: the patient was seen and examined on 05/25/2021 PCP: [name], MD  Patient coming from: Home - lives with wife; HGD:[id number], [phone]    Chief Complaint: Stroke-like symptoms  HPI: [name] [name] is a 88 y.o. male with medical history significant of AAA; CAD s/p CABG; chronic systolic CHF; afib s/p Maze, on Xarelto; HTN; HLD; PMR (currently on steroids); pre-diabetes; OSA on CPAP; PAD with lymphedema; and AS s/p TAVR presenting with stroke-like symptoms. He has been having a lot of back pain and has been disabled in an electric wheelchair.  They had a meeting with bad news.  Shortly thereafter, he was trembling on one side with slurred speech.  His episode lasted less than a minute but the speech difficulty persisted.  Still having some dysarthria.  He has been anemic/thrombocytopenic recently with transfusion x 2 units twice.  He saw oncology at [facility] and his iron was good but they having been giving Epogen.  He was due for his second injection today (of 3 total).  He saw the urologist this week too and was diagnosed with a UTI and is on antibiotics (has only taken two days and 3 doses of cefdinir), urine was cloudy with gross hematuria and back pain.  He has had a compression fracture and this is why he doesn't walk.  He also has wounds and had Mohs surgery Monday on his chin, sees wound care every other week.   ER Course:  Came in as code stroke - speech difficulty, RUE weakness.  Neuro consulted. CT with acute stroke, L frontal.  CTA without LVO.  Recommends admission.   Review of Systems: As mentioned in the history of present illness. All other systems reviewed and are negative. Past Medical History:  Diagnosis Date   AAA (abdominal aortic aneurysm) (HCC)    Arthritis    Ascending aorta dilatation ([place]) 03/13/2012   Fusiform dilatation of the ascending thoracic aorta discovered during  surgery, max diameter 4.2-4.3 cm   BPH (benign prostatic hypertrophy)    Chronic systolic heart failure ([place]) 03/03/2012   Chronic venous insufficiency    GERD (gastroesophageal reflux disease)    Glaucoma    Gout    History of kidney stones    Hyperlipidemia    Hypertension    Obesity (BMI 30-39.9)    Paroxysmal atrial fibrillation ([place]) 02/29/2012   Recurrent paroxysmal, new-onset    PMR (polymyalgia rheumatica) (HCC)    Polymyalgia rheumatica ([place]) 02/06/2019   Pre-diabetes    S/P CABG x 3 03/06/2012   LIMA to LAD, SVG to OM, SVG to RCA, EVH via right thigh   S/P Maze operation for atrial fibrillation 03/06/2012   Complete bilateral lesions set using bipolar radiofrequency and cryothermy ablation with clipping of LA appendage   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   s/p TAVR with a 26 mm Edwards via the left subclavian by Drs Buena Irish and Bartle   Severe aortic stenosis 09/29/2019   Sleep apnea    USES CPAP NIGHTLY   Past Surgical History:  Procedure Laterality Date   ABDOMINAL AORTOGRAM W/LOWER EXTREMITY N/A 10/26/2020   Procedure: ABDOMINAL AORTOGRAM W/LOWER EXTREMITY;  Surgeon: Broadus John, MD;  Location: [place] [facility];  Service: Cardiovascular;  Laterality: N/A;   CARDIAC CATHETERIZATION     CATARACT EXTRACTION, BILATERAL     with lens implants   CHOLECYSTECTOMY N/A 03/24/2012   Procedure: LAPAROSCOPIC  CHOLECYSTECTOMY WITH INTRAOPERATIVE CHOLANGIOGRAM;  Surgeon: Zenovia Jarred, MD;  Location: [place];  Service: General;  Laterality: N/A;   CORONARY ARTERY BYPASS GRAFT N/A 03/06/2012   Procedure: CORONARY ARTERY BYPASS GRAFTING (CABG);  Surgeon: Rexene Alberts, MD;  Location: [place];  Service: Open Heart Surgery;  Laterality: N/A;   ENDOVEIN HARVEST OF GREATER SAPHENOUS VEIN Right 03/06/2012   Procedure: ENDOVEIN HARVEST OF GREATER SAPHENOUS VEIN;  Surgeon: Rexene Alberts, MD;  Location: [place];  Service: Open Heart Surgery;  Laterality: Right;    INTRAOPERATIVE TRANSESOPHAGEAL ECHOCARDIOGRAM N/A 03/06/2012   Procedure: INTRAOPERATIVE TRANSESOPHAGEAL ECHOCARDIOGRAM;  Surgeon: Rexene Alberts, MD;  Location: [place];  Service: Open Heart Surgery;  Laterality: N/A;   IR KYPHO LUMBAR INC FX REDUCE BONE BX UNI/BIL CANNULATION INC/IMAGING  02/23/2020   LEFT HEART CATH Right 03/02/2012   Procedure: LEFT HEART CATH;  Surgeon: Sherren Mocha, MD;  Location: [facility] C[facility];  Service: Cardiovascular;  Laterality: Right;   MAZE N/A 03/06/2012   Procedure: MAZE;  Surgeon: Rexene Alberts, MD;  Location: [place];  Service: Open Heart Surgery;  Laterality: N/A;   PERIPHERAL VASCULAR INTERVENTION Right 10/26/2020   Procedure: PERIPHERAL VASCULAR INTERVENTION;  Surgeon: Broadus John, MD;  Location: [place] [facility];  Service: Cardiovascular;  Laterality: Right;   PROSTATECTOMY     partial   RIGHT/LEFT HEART CATH AND CORONARY/GRAFT ANGIOGRAPHY N/A 09/29/2019   Procedure: RIGHT/LEFT HEART CATH AND CORONARY/GRAFT ANGIOGRAPHY;  Surgeon: Adrian Prows, MD;  Location: [place] [facility];  Service: Cardiovascular;  Laterality: N/A;   TEE WITHOUT CARDIOVERSION N/A 11/10/2019   Procedure: TRANSESOPHAGEAL ECHOCARDIOGRAM (TEE);  Surgeon: Burnell Blanks, MD;  Location: [place];  Service: Open Heart Surgery;  Laterality: N/A;   TRANSCATHETER AORTIC VALVE REPLACEMENT, TRANSFEMORAL  11/10/2019   Social History:  reports that he quit smoking about 58 years ago. His smoking use included cigarettes. He has a 7.50 pack-year smoking history. He has never been exposed to tobacco smoke. He has never used smokeless tobacco. He reports current alcohol use of about 2.0 standard drinks per week. He reports that he does not use drugs.  Allergies  Allergen Reactions   Amoxicillin Other (See Comments)    Headache, debilitated   Bee Venom Anaphylaxis   Avelox [Moxifloxacin] Other (See Comments)    Unknown   Codeine Other (See Comments)   Duloxetine Hcl Other (See Comments)     felt weird   Levitra [Vardenafil] Other (See Comments)    Unknown   Morphine And Related Other (See Comments)    hypotension   Penicillin G Sodium Other (See Comments)    Severe headache, fatigue "debilitated"   Testosterone Other (See Comments)    unknown   Tizanidine Other (See Comments)    unknown   Viagra [Sildenafil] Other (See Comments)    didn't like it    Family History  Problem Relation Age of Onset   Dementia Mother    Stroke Brother     Prior to Admission medications   Medication Sig Start Date End Date Taking? Authorizing Provider  acetaminophen (TYLENOL) 500 MG tablet Take 2 tablets (1,000 mg total) by mouth 3 (three) times daily. For 3 to 5 days and then change to 1000 mg 3 times daily as needed for pain. Patient taking differently: Take 500 mg by mouth as needed for mild pain. 08/23/20   Hongalgi, Lenis Dickinson, MD  amiodarone (PACERONE) 200 MG tablet Take 1 tablet (200 mg total) by mouth  daily. 03/09/20   Cantwell, Celeste C, PA-C  amLODipine (NORVASC) 5 MG tablet Take 5 mg by mouth daily.    [provider]  aspirin EC 81 MG EC tablet Take 1 tablet (81 mg total) by mouth daily. Swallow whole. 10/28/20   Oswald Hillock, MD  atorvastatin (LIPITOR) 40 MG tablet Take 1 tablet (40 mg total) by mouth daily. 01/20/21   Cantwell, Celeste C, PA-C  cadexomer iodine (IODOSORB) 0.9 % gel Apply 1 application. topically every other day as needed for wound care.    [provider]  cholecalciferol (VITAMIN D3) 25 MCG (1000 UNIT) tablet Take 1,000 Units by mouth daily.     [provider]  Cyanocobalamin (VITAMIN B-12) 5000 MCG TBDP Take 5,000 mcg by mouth daily.    [provider]  denosumab (PROLIA) 60 MG/ML SOSY injection Inject 60 mg into the skin every 6 (six) months.    [provider]  docusate sodium (COLACE) 100 MG capsule Take 1 capsule (100 mg total) by mouth 2 (two) times daily. Patient taking differently: Take 100 mg by mouth every  evening. 08/23/20   Hongalgi, Lenis Dickinson, MD  EPINEPHrine 0.3 mg/0.3 mL IJ SOAJ injection Inject 0.3 mLs (0.3 mg total) into the muscle as needed for anaphylaxis. Patient taking differently: Inject 0.3 mg into the muscle once as needed for anaphylaxis. 08/08/18   Dara Hoyer, FNP  ezetimibe (ZETIA) 10 MG tablet Take 10 mg by mouth daily.    [provider]  finasteride (PROSCAR) 5 MG tablet Take 5 mg by mouth daily. 09/15/20   [provider]  fish oil-omega-3 fatty acids 1000 MG capsule Take 1 g by mouth daily.     [provider]  fluticasone (FLONASE) 50 MCG/ACT nasal spray Place 1 spray into both nostrils daily as needed for allergies.    [provider]  gabapentin (NEURONTIN) 100 MG capsule Take 100-200 mg by mouth See admin instructions. '200mg'$  in the morning, '100mg'$  in the evening, and '200mg'$  at bedtime    [provider]  galantamine (RAZADYNE ER) 8 MG 24 hr capsule Take 8 mg by mouth Daily. 02/26/11   [provider]  hydrALAZINE (APRESOLINE) 25 MG tablet Take 25 mg by mouth 2 (two) times daily. 08/09/20   [provider]  HYDROcodone-acetaminophen (NORCO/VICODIN) 5-325 MG tablet Take 1 tablet by mouth every 6 (six) hours as needed for moderate pain.    [provider]  HYDROcodone-acetaminophen (NORCO/VICODIN) 5-325 MG tablet Take 1 tablet by mouth every 8 (eight) hours as needed for up to 15 doses for severe pain. 03/16/21   Wyvonnia Dusky, MD  isosorbide mononitrate (IMDUR) 60 MG 24 hr tablet TAKE 1 TABLET(60 MG) BY MOUTH DAILY AFTER SUPPER 05/22/21   Cantwell, Celeste C, PA-C  leflunomide (ARAVA) 20 MG tablet Take 10 mg by mouth daily.    [provider]  Magnesium 250 MG TABS Take 250 mg by mouth daily.    [provider]  memantine (NAMENDA) 10 MG tablet Take 10 mg by mouth 2 (two) times daily. 09/08/19   [provider]  metoprolol succinate (TOPROL-XL) 25 MG 24 hr tablet Take 0.5 tablets (12.5 mg  total) by mouth daily as needed (for high heart rate, consistently greater than 110/min.). Patient taking differently: Take 12.5 mg by mouth daily. 02/20/21   Cantwell, Celeste C, PA-C  MOVANTIK 25 MG TABS tablet Take 25 mg by mouth daily. 03/14/21   [provider]  naftifine (  NAFTIN) 1 % cream Apply 1 application. topically daily as needed (fungal skin infection).    [provider]  nitroGLYCERIN (NITROSTAT) 0.4 MG SL tablet Place 1 tablet (0.4 mg total) under the tongue every 5 (five) minutes x 3 doses as needed for chest pain. 12/16/19   Cantwell, Celeste C, PA-C  pantoprazole (PROTONIX) 40 MG tablet Take 40 mg by mouth every other day.  10/28/14   [provider]  potassium chloride (MICRO-K) 10 MEQ CR capsule TAKE ONE CAPSULE BY MOUTH EVERY DAY WITH LASIX Patient taking differently: Take 10 mEq by mouth daily. 02/16/21   Cantwell, Celeste C, PA-C  predniSONE (DELTASONE) 5 MG tablet Take 5 mg by mouth daily. Continuous 12/18/20   [provider]  Psyllium Husk POWD Take 1 Scoop by mouth daily.     [provider]  Rivaroxaban (XARELTO) 15 MG TABS tablet Take 15 mg by mouth daily with supper.    [provider]  torsemide (DEMADEX) 20 MG tablet Take 2 tablets (40 mg total) by mouth daily. 09/16/20   Cantwell, Celeste C, PA-C  triamcinolone cream (KENALOG) 0.1 % Apply 1 application topically daily as needed (skin irritation).    [provider]    Physical Exam: Vitals:   05/25/21 1330 05/25/21 1345 05/25/21 1400 05/25/21 1603  BP: (!) 171/66 (!) 156/63 (!) 155/69 (!) 164/71  Pulse: (!) 56 (!) 49 (!) 55 62  Resp: '16 12 16 16  '$ Temp:      TempSrc:      SpO2: 100% 95% 94% 96%  Weight:      Height:       General:  Appears calm and comfortable and is in NAD, dysarthric speech is frustrating to him at times Eyes:  PERRL, EOMI, normal lids, iris ENT:  grossly normal hearing, lips & tongue, mmm Neck:  no LAD, masses or  thyromegaly Cardiovascular:  Irregularly irregular but rate controlled, no m/r/g. No LE edema.  Respiratory:   CTA bilaterally with no wheezes/rales/rhonchi.  Normal respiratory effort. Abdomen:  soft, NT, ND Skin:  no rash or induration seen on limited exam Musculoskeletal:  grossly normal tone BUE/BLE, good ROM, no bony abnormality Psychiatric:  blunted mood and affect, speech dysarthric with periodic expressive aphasia, AOx3 Neurologic:  CN 2-12 grossly intact, moves all extremities in coordinated fashion, sensation intact   Radiological Exams on Admission: Independently reviewed - see discussion in A/P where applicable  MR BRAIN WO CONTRAST  Result Date: 05/25/2021 CLINICAL DATA:  Acute neuro deficit.  Stroke. EXAM: MRI HEAD WITHOUT CONTRAST TECHNIQUE: Multiplanar, multiecho pulse sequences of the brain and surrounding structures were obtained without intravenous contrast. COMPARISON:  CT angio head and neck 05/25/2021 FINDINGS: Brain: Small acute infarct right occipital cortex. Small acute infarct in the left frontal cortex over the convexity. Small to moderate cortical infarct left operculum. Generalized atrophy. Mild chronic microvascular ischemic changes. Small chronic infarcts in the cerebellum bilaterally. Negative for mass. Chronic hemorrhage in the right frontal lobe. Vascular: Normal arterial flow voids. Skull and upper cervical spine: No focal skeletal lesion. Sinuses/Orbits: Paranasal sinuses clear. Bilateral cataract extraction Other: None IMPRESSION: Small areas of acute infarction in the right occipital lobe and left frontal lobe over the convexity. Mild to moderate acute infarct left operculum. Possible cerebral emboli. Atrophy and mild chronic microvascular ischemia Electronically Signed   By: Franchot Gallo M.D.   On: 05/25/2021 15:12   CT HEAD CODE STROKE WO CONTRAST  Addendum Date: 05/25/2021   ADDENDUM REPORT:  05/25/2021 12:40 ADDENDUM: Impression #1 called by telephone at  the time of interpretation on 05/25/2021 at 11:50 am to provider Dr. Theda Sers, Who verbally acknowledged these results. Electronically Signed   By: Kellie Simmering D.O.   On: 05/25/2021 12:40   Result Date: 05/25/2021 CLINICAL DATA:  Code stroke. Provided history: Neuro deficit, acute, stroke suspected. EXAM: CT HEAD WITHOUT CONTRAST TECHNIQUE: Contiguous axial images were obtained from the base of the skull through the vertex without intravenous contrast. RADIATION DOSE REDUCTION: This exam was performed according to the departmental dose-optimization program which includes automated exposure control, adjustment of the mA and/or kV according to patient size and/or use of iterative reconstruction technique. COMPARISON:  Prior head CT examinations 08/17/2020 and earlier. FINDINGS: Brain: Mild-to-moderate generalized cerebral atrophy. Small left MCA territory cortical infarct within the mid-to-posterior left frontal lobe, new from the prior head CT of 08/17/2020 and likely acute (series 2, images 8-10). Mild ill-defined hypoattenuation within the cerebral white matter, nonspecific but compatible with chronic small vessel ischemic disease. Redemonstrated small chronic infarct within the left cerebellar hemisphere. There is no acute intracranial hemorrhage. No extra-axial fluid collection. No evidence of an intracranial mass. No midline shift. Vascular: No hyperdense vessel. Atherosclerotic calcifications. Skull: No fracture or aggressive osseous lesion. Sinuses/Orbits: No mass or acute finding within the imaged orbits. Mild mucosal thickening and small mucous retention cysts within the bilateral ethmoid air cells. ASPECTS [facility] Stroke Program Early CT Score) - Ganglionic level infarction (caudate, lentiform nuclei, internal capsule, insula, M1-M3 cortex): 7 - Supraganglionic infarction (M4-M6 cortex): 2 Total score (0-10 with 10 being normal): 9 Attempts are being made to reach the ordering provider at this time.  IMPRESSION: Small left MCA territory cortical infarct within the mid-to-posterior left frontal lobe, new from the prior head CT of 08/17/2020 and likely acute. ASPECTS is 9. Mild chronic small vessel ischemic changes within the cerebral white matter. Redemonstrated small chronic infarct within the left cerebellar hemisphere. Mild-to-moderate generalized cerebral atrophy. Bilateral ethmoid sinusitis. Electronically Signed: By: Kellie Simmering D.O. On: 05/25/2021 11:43   CT ANGIO HEAD NECK W WO CM (CODE STROKE)  Result Date: 05/25/2021 CLINICAL DATA:  Provided history: Neuro deficit, acute, stroke suspected; acute right-sided weakness with expressive aphasia. EXAM: CT ANGIOGRAPHY HEAD AND NECK TECHNIQUE: Multidetector CT imaging of the head and neck was performed using the standard protocol during bolus administration of intravenous contrast. Multiplanar CT image reconstructions and MIPs were obtained to evaluate the vascular anatomy. Carotid stenosis measurements (when applicable) are obtained utilizing NASCET criteria, using the distal internal carotid diameter as the denominator. RADIATION DOSE REDUCTION: This exam was performed according to the departmental dose-optimization program which includes automated exposure control, adjustment of the mA and/or kV according to patient size and/or use of iterative reconstruction technique. CONTRAST:  70m OMNIPAQUE IOHEXOL 350 MG/ML SOLN COMPARISON:  Noncontrast head CT performed immediately prior 05/25/2021. FINDINGS: CTA NECK FINDINGS Aortic arch: Standard aortic branching. Atherosclerotic plaque within the visualized aortic arch and proximal major branch vessels of the neck. No hemodynamically significant innominate or proximal subclavian artery stenosis. Right carotid system: CCA and ICA patent within the neck. Soft and calcified atherosclerotic plaque about the carotid bifurcation and within the proximal ICA, resulting in less than 50% stenosis at the ICA origin. Left  carotid system: CCA and ICA patent within the neck. Predominantly calcified plaque within the CCA, bout the carotid bifurcation and within the proximal ICA without measurable stenosis. Vertebral arteries: Vertebral arteries patent within the neck. The left vertebral artery  is dominant. Nonstenotic calcified plaque at the origin of the left vertebral artery. Skeleton: Straightening of the expected cervical lordosis. 2 mm C4-C5 grade 1 anterolisthesis. Cervical spondylosis. Vertebral ankylosis at C2-C3 and C5-C6. No acute fracture or aggressive osseous lesion. Other neck: No neck mass or cervical lymphadenopathy. Upper chest: Prior median sternotomy. Incompletely imaged bilateral pleural effusions. Interlobular septal thickening and ground-glass opacity within the imaged lung apices, likely reflecting edema. Review of the MIP images confirms the above findings CTA HEAD FINDINGS Anterior circulation: The intracranial internal carotid arteries are patent. Calcified plaque within both vessels. Moderate stenosis of the right paraclinoid segment. Otherwise, no more than mild stenosis of the intracranial ICAs. The M1 middle cerebral arteries are patent. No M2 proximal branch occlusion or high-grade proximal stenosis. The anterior cerebral arteries are patent. Posterior circulation: Severe atherosclerotic irregularity with severe multifocal stenoses in the right vertebral artery at the level of skull base and intracranially. Calcified plaque within the proximal V4 left vertebral artery with moderate stenosis. The basilar artery is patent. The posterior cerebral arteries are patent. The left PCA is fetal in origin. The right posterior communicating artery is diminutive or absent. Venous sinuses: Within the limitations of contrast timing, no convincing thrombus. Anatomic variants: As described. Review of the MIP images confirms the above findings No emergent large vessel occlusion. This finding and CTA head impression #3  called by telephone at the time of interpretation on 05/25/2021 at 11:50 am to provider [facility] , who verbally acknowledged these results. IMPRESSION: CTA neck: 1. The common carotid and internal carotid arteries are patent within the neck. Atherosclerotic plaque bilaterally, without hemodynamically significant stenosis. 2. Vertebral arteries patent within the neck. Non-stenotic calcified plaque at the origin of the left vertebral artery. 3. Pulmonary edema within the imaged lung apices. 4. Incompletely imaged bilateral pleural effusions. 5. Cervical spondylosis. C2-C3 and C5-C6 vertebral ankylosis. CTA head: 1. No intracranial large vessel occlusion is identified. 2. Intracranial atherosclerotic disease with multifocal stenoses, most notably as follows. 3. Multifocal severe stenoses within the right vertebral artery at the level of the skull base and intracranially. 4. Moderate stenosis within the proximal V4 left vertebral artery. 5. Moderate stenosis of the paraclinoid right ICA. Electronically Signed   By: Kellie Simmering D.O.   On: 05/25/2021 12:15    EKG: Independently reviewed.  Afib with rate 60; RBBB and LAFB with NSCSLT   Labs on Admission: I have personally reviewed the available labs and imaging studies at the time of the admission.  Pertinent labs:    Glucose 115 BUN 26/Creatinine 1.71/GFR 37 - stable Albumin 3.3 Lipids:  224/73/122/146 WBC 5.6 Hgb 9.9 - stable Platelets 123 - stable INR 1.6 A1c 5.8   Assessment and Plan: Principal Problem:   Acute CVA (cerebrovascular accident) ([place]) Active Problems:   Hypertension   Hyperlipidemia   Paroxysmal atrial fibrillation (HCC)   S/P CABG x 3   S/P Maze operation for atrial fibrillation   OSA (obstructive sleep apnea)   S/P TAVR (transcatheter aortic valve replacement)   CKD (chronic kidney disease) stage 3, GFR 30-59 ml/min (HCC)   Urinary tract infection with hematuria   Pressure injury of skin   Dementia (HCC)    Polymyalgia rheumatica (HCC)   DNR (do not resuscitate)   CVA  -Patient presenting with acute symptoms of R sided trembling which was transient and speech disturbance (expressive aphasia) which is persistent -He has also been having several days of executive function difficulties (paying bills, anger - maybe  longer for this) and mild dysarthria -Concerning for CVA -CT showed small L MCA infarct in L frontal lobe -CTA with stenosis but no LVO -Aspirin has been given to reduce stroke mortality and decrease morbidity -Will admit for CVA evaluation -Telemetry monitoring -MRI confirms CVA and shows apparent cerebral emboli pattern, suggestive of Xarelto failure -Echo -Risk stratification with FLP, A1c -Will add ASA 81 mg for now -Neurology consult -PT/OT/ST/Nutrition Consults  HTN -Allow permissive HTN for now -Treat BP only if >220/120, and then with goal of 15% reduction -Hold hydralazine, Toprol XL   HLD -LDL is 122, goal <70 -Continue Zetia -He is no longer taking statin therapy but clearly needs to resume; will start Lipitor 40 mg daily -Hold fish oil due to limited inpatient utility   Pre-DM -A1c is fine -No meds needed -Continue Neurontin  CAD -s/p CABG -Continue Imdur  Chronic systolic CHF -Appears to be compensated at this time -Echo pending -Hold torsemide for now  Afib -s/p Maze -in afib currently -On Xarelto, appears to have failed based on embolic pattern on MRI -Consider transition to Eliquis, will defer to neurology -Continue amiodarone for rate control  PMR -Still on prednisone, tapering  Chronic back pain -Receives steroid injections -Pain recently worse, possibly related to prednisone taper  OSA -Continue CPAP  PAD -Has lymphedema -s/p balloon angioplasty 10/2020 -Excellent perfusion to BLE based on recent ABIs  UTI -Diagnosed by urology this week -Will continue Cefepime to complete course -Continue Proscar  Stage 3b CKD -Appears  to be stable at this time -Will follow  Wounds -Recent Mohs, other leg ulcers -Followed by wound care as outpatient -Will place consult  Anemia -Getting Epo injections q3w x 3 (has gotten one, due again tomorrow) -Looks stable for now -Repeat CBC in AM  MCI -Continue Namenda, galantamine   DNR -I have discussed code status with the patient and his wife and  they are in agreement that the patient would not desire resuscitation and would prefer to die a natural death should that situation arise. -He will need a gold out of facility DNR form at the time of discharge    Advance Care Planning:   Code Status: DNR   Consults: Neurology; PT/OT/ST; TOC team; nutrition  DVT Prophylaxis: Xarelto - for now  Family Communication: Wife was present throughout evaluation  Severity of Illness: The appropriate patient status for this patient is INPATIENT. Inpatient status is judged to be reasonable and necessary in order to provide the required intensity of service to ensure the patient's safety. The patient's presenting symptoms, physical exam findings, and initial radiographic and laboratory data in the context of their chronic comorbidities is felt to place them at high risk for further clinical deterioration. Furthermore, it is not anticipated that the patient will be medically stable for discharge from the hospital within 2 midnights of admission.   * I certify that at the point of admission it is my clinical judgment that the patient will require inpatient hospital care spanning beyond 2 midnights from the point of admission due to high intensity of service, high risk for further deterioration and high frequency of surveillance required.*  Author: Karmen Bongo, MD 05/25/2021 5:16 PM  For on call review [email or web address].

## 2021-05-25 NOTE — Code Documentation (Signed)
Stroke Response Nurse Documentation Code Documentation  [name] [name] is a 88 y.o. male arriving to [facility]  via [facility] on 05/25/21 with past medical hx of HTN, MI, HLD. On Xarelto (rivaroxaban) daily. Code stroke was activated by EMS.   Patient from home where he was LKW at 1050 and now complaining of right sided trembling and difficulties speaking.   Stroke team at the bedside on patient arrival. Labs drawn and patient cleared for CT by Dr. Tomi Bamberger. Patient to CT with team. NIHSS 1, see documentation for details and code stroke times. Patient with Expressive aphasia  on exam. The following imaging was completed:  CT Head and CTA. Patient is not a candidate for IV Thrombolytic due to daily Zarelto. Patient is not a candidate for IR due to no suspected LVO.   Care Plan: Q2 NIHSS and VS.   Bedside handoff with ED RN Lysbeth Galas.    Meda Klinefelter  Stroke Response RN

## 2021-05-25 NOTE — Plan of Care (Signed)
?  Problem: Education: ?Goal: Knowledge of disease or condition will improve ?Outcome: Progressing ?Goal: Knowledge of secondary prevention will improve (SELECT ALL) ?Outcome: Progressing ?Goal: Knowledge of patient specific risk factors will improve (INDIVIDUALIZE FOR PATIENT) ?Outcome: Progressing ?  ?

## 2021-05-25 NOTE — ED Notes (Signed)
Patient transported to MRI 

## 2021-05-25 NOTE — ED Provider Notes (Signed)
[facility] EMERGENCY DEPARTMENT Provider Note   CSN: [id number] Arrival date & time: 05/25/21  1119     History  Chief Complaint  Patient presents with   Code Stroke    BONIFACE GOFFE is a 88 y.o. male.  HPI  Patient has a history of hypertension, coronary disease, hyperlipidemia, acute coronary syndrome, A-fib, CHF, polymyalgia rheumatica, severe aortic stenosis who presents to the ED with acute stroke symptoms.  Patient presented as a code stroke.  This morning at 10:50 AM patient had sudden onset of right arm shaking.  He started feeling weak and felt that his speech was off.  Patient was seen immediately on arrival by the code stroke team.  Patient feels like the arm symptoms are improving but he still feels like his speech is off  Home Medications Prior to Admission medications   Medication Sig Start Date End Date Taking? Authorizing Provider  acetaminophen (TYLENOL) 500 MG tablet Take 2 tablets (1,000 mg total) by mouth 3 (three) times daily. For 3 to 5 days and then change to 1000 mg 3 times daily as needed for pain. Patient taking differently: Take 500 mg by mouth as needed for mild pain. 08/23/20   Hongalgi, Lenis Dickinson, MD  amiodarone (PACERONE) 200 MG tablet Take 1 tablet (200 mg total) by mouth daily. 03/09/20   Cantwell, Celeste C, PA-C  amLODipine (NORVASC) 5 MG tablet Take 5 mg by mouth daily.    [provider]  cadexomer iodine (IODOSORB) 0.9 % gel Apply 1 application. topically every other day as needed for wound care.    [provider]  cholecalciferol (VITAMIN D3) 25 MCG (1000 UNIT) tablet Take 1,000 Units by mouth daily.     [provider]  Cyanocobalamin (VITAMIN B-12) 5000 MCG TBDP Take 5,000 mcg by mouth daily.    [provider]  denosumab (PROLIA) 60 MG/ML SOSY injection Inject 60 mg into the skin every 6 (six) months.    [provider]  docusate sodium (COLACE) 100 MG capsule Take 1 capsule (100 mg  total) by mouth 2 (two) times daily. Patient taking differently: Take 100 mg by mouth every evening. 08/23/20   Hongalgi, Lenis Dickinson, MD  EPINEPHrine 0.3 mg/0.3 mL IJ SOAJ injection Inject 0.3 mLs (0.3 mg total) into the muscle as needed for anaphylaxis. Patient taking differently: Inject 0.3 mg into the muscle once as needed for anaphylaxis. 08/08/18   Dara Hoyer, FNP  ezetimibe (ZETIA) 10 MG tablet Take 10 mg by mouth daily.    [provider]  finasteride (PROSCAR) 5 MG tablet Take 5 mg by mouth daily. 09/15/20   [provider]  fish oil-omega-3 fatty acids 1000 MG capsule Take 1 g by mouth daily.     [provider]  fluticasone (FLONASE) 50 MCG/ACT nasal spray Place 1 spray into both nostrils daily as needed for allergies.    [provider]  gabapentin (NEURONTIN) 100 MG capsule Take 100-200 mg by mouth See admin instructions. '200mg'$  in the morning, '100mg'$  in the evening, and '200mg'$  at bedtime    [provider]  galantamine (RAZADYNE ER) 8 MG 24 hr capsule Take 8 mg by mouth Daily. 02/26/11   [provider]  hydrALAZINE (APRESOLINE) 25 MG tablet Take 25 mg by mouth 2 (two) times daily. 08/09/20   [provider]  HYDROcodone-acetaminophen (NORCO/VICODIN) 5-325 MG tablet Take 1 tablet by mouth every 6 (six) hours as needed for moderate pain.  [provider]  isosorbide mononitrate (IMDUR) 60 MG 24 hr tablet TAKE 1 TABLET(60 MG) BY MOUTH DAILY AFTER SUPPER 05/22/21   Cantwell, Celeste C, PA-C  leflunomide (ARAVA) 20 MG tablet Take 10 mg by mouth daily.    [provider]  Magnesium 250 MG TABS Take 250 mg by mouth daily.    [provider]  memantine (NAMENDA) 10 MG tablet Take 10 mg by mouth 2 (two) times daily. 09/08/19   [provider]  metoprolol succinate (TOPROL-XL) 25 MG 24 hr tablet Take 0.5 tablets (12.5 mg total) by mouth daily as needed (for high heart rate, consistently greater than  110/min.). Patient taking differently: Take 12.5 mg by mouth daily. 02/20/21   Cantwell, Celeste C, PA-C  naftifine (NAFTIN) 1 % cream Apply 1 application. topically daily as needed (fungal skin infection).    [provider]  nitroGLYCERIN (NITROSTAT) 0.4 MG SL tablet Place 1 tablet (0.4 mg total) under the tongue every 5 (five) minutes x 3 doses as needed for chest pain. 12/16/19   Cantwell, Celeste C, PA-C  pantoprazole (PROTONIX) 40 MG tablet Take 40 mg by mouth every other day.  10/28/14   [provider]  potassium chloride (MICRO-K) 10 MEQ CR capsule TAKE ONE CAPSULE BY MOUTH EVERY DAY WITH LASIX Patient taking differently: Take 10 mEq by mouth daily. 02/16/21   Cantwell, Celeste C, PA-C  predniSONE (DELTASONE) 5 MG tablet Take 5 mg by mouth daily. Continuous 12/18/20   [provider]  Psyllium Husk POWD Take 1 Scoop by mouth daily.     [provider]  Rivaroxaban (XARELTO) 15 MG TABS tablet Take 15 mg by mouth daily with supper.    [provider]  torsemide (DEMADEX) 20 MG tablet Take 2 tablets (40 mg total) by mouth daily. 09/16/20   Cantwell, Celeste C, PA-C  triamcinolone cream (KENALOG) 0.1 % Apply 1 application topically daily as needed (skin irritation).    [provider]      Allergies    Amoxicillin, Bee venom, Avelox [moxifloxacin], Codeine, Duloxetine hcl, Levitra [vardenafil], Morphine and related, Penicillin g sodium, Testosterone, Tizanidine, and Viagra [sildenafil]    Review of Systems   Review of Systems  Constitutional:  Negative for fever.   Physical Exam Updated Vital Signs BP (!) 171/66   Pulse (!) 56   Temp 97.6 F (36.4 C) (Oral)   Resp 16   Ht 1.829 m (6')   Wt 100.6 kg   SpO2 100%   BMI 30.08 kg/m  Physical Exam Vitals and nursing note reviewed.  Constitutional:      General: He is not in acute distress.    Appearance: He is well-developed.  HENT:     Head: Normocephalic and atraumatic.      Right Ear: External ear normal.     Left Ear: External ear normal.  Eyes:     General: No scleral icterus.       Right eye: No discharge.        Left eye: No discharge.     Conjunctiva/sclera: Conjunctivae normal.  Neck:     Trachea: No tracheal deviation.  Cardiovascular:     Rate and Rhythm: Normal rate and regular rhythm.  Pulmonary:     Effort: Pulmonary effort is normal. No respiratory distress.     Breath sounds: Normal breath sounds. No stridor. No wheezing or rales.  Abdominal:     General: Bowel sounds are normal. There is no distension.  Palpations: Abdomen is soft.     Tenderness: There is no abdominal tenderness. There is no guarding or rebound.  Musculoskeletal:        General: No tenderness or deformity.     Cervical back: Neck supple.  Skin:    General: Skin is warm and dry.     Findings: No rash.  Neurological:     Mental Status: He is alert.     Cranial Nerves: No cranial nerve deficit (no facial droop, extraocular movements intact, no slurred speech noted during my evaluation , some hesitation with certain words).     Sensory: No sensory deficit.     Motor: No abnormal muscle tone or seizure activity.     Coordination: Coordination normal.  Psychiatric:        Mood and Affect: Mood normal.    ED Results / Procedures / Treatments   Labs (all labs ordered are listed, but only abnormal results are displayed) Labs Reviewed  PROTIME-INR - Abnormal; Notable for the following components:      Result Value   Prothrombin Time 18.8 (*)    INR 1.6 (*)    All other components within normal limits  APTT - Abnormal; Notable for the following components:   aPTT 40 (*)    All other components within normal limits  CBC - Abnormal; Notable for the following components:   RBC 3.30 (*)    Hemoglobin 9.9 (*)    HCT 31.1 (*)    RDW 18.0 (*)    Platelets 123 (*)    All other components within normal limits  COMPREHENSIVE METABOLIC PANEL - Abnormal; Notable for the  following components:   Glucose, Bld 115 (*)    BUN 26 (*)    Creatinine, Ser 1.71 (*)    Albumin 3.3 (*)    GFR, Estimated 37 (*)    All other components within normal limits  I-STAT CHEM 8, ED - Abnormal; Notable for the following components:   Potassium 3.4 (*)    BUN 30 (*)    Creatinine, Ser 1.70 (*)    Glucose, Bld 111 (*)    Calcium, Ion 1.07 (*)    Hemoglobin 10.5 (*)    HCT 31.0 (*)    All other components within normal limits  CBG MONITORING, ED - Abnormal; Notable for the following components:   Glucose-Capillary 139 (*)    All other components within normal limits  DIFFERENTIAL  HEMOGLOBIN A1C  LIPID PANEL    EKG EKG Interpretation  Date/Time:  Thursday May 25 2021 11:55:45 EDT Ventricular Rate:  60 PR Interval:    QRS Duration: 150 QT Interval:  507 QTC Calculation: 507 R Axis:   -54 Text Interpretation: Atrial fibrillation RBBB and LAFB Baseline wander in lead(s) V4 No significant change since last tracing Confirmed by Dorie Rank [phone]) on 05/25/2021 11:58:32 AM  Radiology CT HEAD CODE STROKE WO CONTRAST  Addendum Date: 05/25/2021   ADDENDUM REPORT: 05/25/2021 12:40 ADDENDUM: Impression #1 called by telephone at the time of interpretation on 05/25/2021 at 11:50 am to provider Dr. Theda Sers, Who verbally acknowledged these results. Electronically Signed   By: Kellie Simmering D.O.   On: 05/25/2021 12:40   Result Date: 05/25/2021 CLINICAL DATA:  Code stroke. Provided history: Neuro deficit, acute, stroke suspected. EXAM: CT HEAD WITHOUT CONTRAST TECHNIQUE: Contiguous axial images were obtained from the base of the skull through the vertex without intravenous contrast. RADIATION DOSE REDUCTION: This exam was performed according to the departmental dose-optimization program  which includes automated exposure control, adjustment of the mA and/or kV according to patient size and/or use of iterative reconstruction technique. COMPARISON:  Prior head CT examinations 08/17/2020  and earlier. FINDINGS: Brain: Mild-to-moderate generalized cerebral atrophy. Small left MCA territory cortical infarct within the mid-to-posterior left frontal lobe, new from the prior head CT of 08/17/2020 and likely acute (series 2, images 8-10). Mild ill-defined hypoattenuation within the cerebral white matter, nonspecific but compatible with chronic small vessel ischemic disease. Redemonstrated small chronic infarct within the left cerebellar hemisphere. There is no acute intracranial hemorrhage. No extra-axial fluid collection. No evidence of an intracranial mass. No midline shift. Vascular: No hyperdense vessel. Atherosclerotic calcifications. Skull: No fracture or aggressive osseous lesion. Sinuses/Orbits: No mass or acute finding within the imaged orbits. Mild mucosal thickening and small mucous retention cysts within the bilateral ethmoid air cells. ASPECTS [facility] Stroke Program Early CT Score) - Ganglionic level infarction (caudate, lentiform nuclei, internal capsule, insula, M1-M3 cortex): 7 - Supraganglionic infarction (M4-M6 cortex): 2 Total score (0-10 with 10 being normal): 9 Attempts are being made to reach the ordering provider at this time. IMPRESSION: Small left MCA territory cortical infarct within the mid-to-posterior left frontal lobe, new from the prior head CT of 08/17/2020 and likely acute. ASPECTS is 9. Mild chronic small vessel ischemic changes within the cerebral white matter. Redemonstrated small chronic infarct within the left cerebellar hemisphere. Mild-to-moderate generalized cerebral atrophy. Bilateral ethmoid sinusitis. Electronically Signed: By: Kellie Simmering D.O. On: 05/25/2021 11:43   CT ANGIO HEAD NECK W WO CM (CODE STROKE)  Result Date: 05/25/2021 CLINICAL DATA:  Provided history: Neuro deficit, acute, stroke suspected; acute right-sided weakness with expressive aphasia. EXAM: CT ANGIOGRAPHY HEAD AND NECK TECHNIQUE: Multidetector CT imaging of the head and neck was  performed using the standard protocol during bolus administration of intravenous contrast. Multiplanar CT image reconstructions and MIPs were obtained to evaluate the vascular anatomy. Carotid stenosis measurements (when applicable) are obtained utilizing NASCET criteria, using the distal internal carotid diameter as the denominator. RADIATION DOSE REDUCTION: This exam was performed according to the departmental dose-optimization program which includes automated exposure control, adjustment of the mA and/or kV according to patient size and/or use of iterative reconstruction technique. CONTRAST:  34m OMNIPAQUE IOHEXOL 350 MG/ML SOLN COMPARISON:  Noncontrast head CT performed immediately prior 05/25/2021. FINDINGS: CTA NECK FINDINGS Aortic arch: Standard aortic branching. Atherosclerotic plaque within the visualized aortic arch and proximal major branch vessels of the neck. No hemodynamically significant innominate or proximal subclavian artery stenosis. Right carotid system: CCA and ICA patent within the neck. Soft and calcified atherosclerotic plaque about the carotid bifurcation and within the proximal ICA, resulting in less than 50% stenosis at the ICA origin. Left carotid system: CCA and ICA patent within the neck. Predominantly calcified plaque within the CCA, bout the carotid bifurcation and within the proximal ICA without measurable stenosis. Vertebral arteries: Vertebral arteries patent within the neck. The left vertebral artery is dominant. Nonstenotic calcified plaque at the origin of the left vertebral artery. Skeleton: Straightening of the expected cervical lordosis. 2 mm C4-C5 grade 1 anterolisthesis. Cervical spondylosis. Vertebral ankylosis at C2-C3 and C5-C6. No acute fracture or aggressive osseous lesion. Other neck: No neck mass or cervical lymphadenopathy. Upper chest: Prior median sternotomy. Incompletely imaged bilateral pleural effusions. Interlobular septal thickening and ground-glass opacity  within the imaged lung apices, likely reflecting edema. Review of the MIP images confirms the above findings CTA HEAD FINDINGS Anterior circulation: The intracranial internal carotid arteries are patent.  Calcified plaque within both vessels. Moderate stenosis of the right paraclinoid segment. Otherwise, no more than mild stenosis of the intracranial ICAs. The M1 middle cerebral arteries are patent. No M2 proximal branch occlusion or high-grade proximal stenosis. The anterior cerebral arteries are patent. Posterior circulation: Severe atherosclerotic irregularity with severe multifocal stenoses in the right vertebral artery at the level of skull base and intracranially. Calcified plaque within the proximal V4 left vertebral artery with moderate stenosis. The basilar artery is patent. The posterior cerebral arteries are patent. The left PCA is fetal in origin. The right posterior communicating artery is diminutive or absent. Venous sinuses: Within the limitations of contrast timing, no convincing thrombus. Anatomic variants: As described. Review of the MIP images confirms the above findings No emergent large vessel occlusion. This finding and CTA head impression #3 called by telephone at the time of interpretation on 05/25/2021 at 11:50 am to provider [facility] , who verbally acknowledged these results. IMPRESSION: CTA neck: 1. The common carotid and internal carotid arteries are patent within the neck. Atherosclerotic plaque bilaterally, without hemodynamically significant stenosis. 2. Vertebral arteries patent within the neck. Non-stenotic calcified plaque at the origin of the left vertebral artery. 3. Pulmonary edema within the imaged lung apices. 4. Incompletely imaged bilateral pleural effusions. 5. Cervical spondylosis. C2-C3 and C5-C6 vertebral ankylosis. CTA head: 1. No intracranial large vessel occlusion is identified. 2. Intracranial atherosclerotic disease with multifocal stenoses, most notably as  follows. 3. Multifocal severe stenoses within the right vertebral artery at the level of the skull base and intracranially. 4. Moderate stenosis within the proximal V4 left vertebral artery. 5. Moderate stenosis of the paraclinoid right ICA. Electronically Signed   By: Kellie Simmering D.O.   On: 05/25/2021 12:15    Procedures Procedures    Medications Ordered in ED Medications  sodium chloride flush (NS) 0.9 % injection 3 mL (3 mLs Intravenous Not Given 05/25/21 1204)  iohexol (OMNIPAQUE) 350 MG/ML injection 80 mL (80 mLs Intravenous Contrast Given 05/25/21 1142)    ED Course/ Medical Decision Making/ A&P                           Medical Decision Making Problems Addressed: Cerebrovascular accident (CVA), unspecified mechanism ([place]): acute illness or injury that poses a threat to life or bodily functions Chronic kidney disease, unspecified CKD stage: chronic illness or injury  Amount and/or Complexity of Data Reviewed Labs: ordered. Radiology: ordered. Discussion of management or test interpretation with external provider(s): Case discussed with Dr. Theda Sers neuro hospitalist as well as Dr. Lorin Mercy hospitalist  Risk Decision regarding hospitalization.   Patient presented to the ED for evaluation of acute speech disturbance and weakness concerning for possible stroke.  Patient was activated as a code stroke.  CT scan does show a small stroke.  Patient is on anticoagulation.  Not a candidate for tPA.  No large vessel occlusion noted on his CT angiogram.  Plan is for admission to the hospital for further treatment evaluation of his acute stroke        Final Clinical Impression(s) / ED Diagnoses Final diagnoses:  Cerebrovascular accident (CVA), unspecified mechanism ([place])  Chronic kidney disease, unspecified CKD stage    Rx / DC Orders ED Discharge Orders     None         Dorie Rank, MD 05/25/21 1344

## 2021-05-25 NOTE — Consult Note (Addendum)
Neurology Consult H&P  [name] [name] MR# [id number] 05/25/2021   CC: right sided weakness and aphasia  History is obtained from: EMS, Wife and chart.  HPI: [name] [name] is a 88 y.o. male PMHx as reviewed below at 40 developed sudden onset of right arm shaking and right sided weakness and felt that his speech was off. EMS noted improvement if weakness but continued to have difficulty with speech.  His wife stated that yesterday he started to have difficulty moving his tongue and the family felt his speech was a little off. His wife also states that he suffers from chronic blood loss source unclear. 4 weeks ago and then 2 weeks ago had to be transfused 2 units of blood and was scheduled to have Iron and EPO infusion today at the cancer center.   LKW: unclear tNK given: No - rivaroxaban blood loss unclear source. IR Thrombectomy No, not indicated Modified Rankin Scale: 1-No significant post stroke disability and can perform usual duties with stroke symptoms NIHSS: 1 speech  ROS: A complete ROS was performed and is negative except as noted in the HPI.   Past Medical History:  Diagnosis Date   AAA (abdominal aortic aneurysm) ([place])    Acute coronary syndrome ([place]) 6/73/4193   Acute systolic heart failure ([place]) 03/03/2012   Angina    Arthritis    Ascending aorta dilatation ([place]) 03/13/2012   Fusiform dilatation of the ascending thoracic aorta discovered during surgery, max diameter 4.2-4.3 cm   Atrial fibrillation ([facility]) 02/29/2012   Recurrent paroxysmal, new-onset   BPH (benign prostatic hypertrophy)    CAD (coronary artery disease)    Cath April 2012 40% left main, 50% LAD, 40% OM, occluded RCA with L-R collaterals    CHF (congestive heart failure) (HCC)    Chronic venous insufficiency    Dyspnea    GERD (gastroesophageal reflux disease)    Glaucoma    Gout    Headache    MIGRAINES in the past   History of kidney stones    Hyperlipidemia    Hypertension    MI (myocardial  infarction) ([place])    Obesity (BMI 30-39.9)    Paroxysmal atrial fibrillation (HCC) 02/29/2012   Recurrent paroxysmal, new-onset    PMR (polymyalgia rheumatica) (HCC)    Polymyalgia rheumatica (HCC) 02/06/2019   Pre-diabetes    S/P CABG x 3 03/06/2012   LIMA to LAD, SVG to OM, SVG to RCA, EVH via right thigh   S/P Maze operation for atrial fibrillation 03/06/2012   Complete bilateral lesions set using bipolar radiofrequency and cryothermy ablation with clipping of LA appendage   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   s/p TAVR with a 26 mm Edwards via the left subclavian by Drs Buena Irish and Bartle   Severe aortic stenosis 09/29/2019   Sleep apnea    USES CPAP NIGHTLY     Family History  Problem Relation Age of Onset   Dementia Mother    Stroke Brother     Social History:  reports that he quit smoking about 58 years ago. His smoking use included cigarettes. He has a 7.50 pack-year smoking history. He has never been exposed to tobacco smoke. He has never used smokeless tobacco. He reports current alcohol use of about 2.0 standard drinks per week. He reports that he does not use drugs.   Prior to Admission medications   Medication Sig Start Date End Date Taking? Authorizing Provider  acetaminophen (TYLENOL) 500 MG tablet Take 2 tablets (  1,000 mg total) by mouth 3 (three) times daily. For 3 to 5 days and then change to 1000 mg 3 times daily as needed for pain. Patient taking differently: Take 500 mg by mouth as needed for mild pain. 08/23/20   Hongalgi, Lenis Dickinson, MD  amiodarone (PACERONE) 200 MG tablet Take 1 tablet (200 mg total) by mouth daily. 03/09/20   Cantwell, Celeste C, PA-C  amLODipine (NORVASC) 5 MG tablet Take 5 mg by mouth daily.    [provider]  aspirin EC 81 MG EC tablet Take 1 tablet (81 mg total) by mouth daily. Swallow whole. 10/28/20   Oswald Hillock, MD  atorvastatin (LIPITOR) 40 MG tablet Take 1 tablet (40 mg total) by mouth daily. 01/20/21    Cantwell, Celeste C, PA-C  cadexomer iodine (IODOSORB) 0.9 % gel Apply 1 application. topically every other day as needed for wound care.    [provider]  cholecalciferol (VITAMIN D3) 25 MCG (1000 UNIT) tablet Take 1,000 Units by mouth daily.     [provider]  Cyanocobalamin (VITAMIN B-12) 5000 MCG TBDP Take 5,000 mcg by mouth daily.    [provider]  denosumab (PROLIA) 60 MG/ML SOSY injection Inject 60 mg into the skin every 6 (six) months.    [provider]  docusate sodium (COLACE) 100 MG capsule Take 1 capsule (100 mg total) by mouth 2 (two) times daily. Patient taking differently: Take 100 mg by mouth every evening. 08/23/20   Hongalgi, Lenis Dickinson, MD  EPINEPHrine 0.3 mg/0.3 mL IJ SOAJ injection Inject 0.3 mLs (0.3 mg total) into the muscle as needed for anaphylaxis. Patient taking differently: Inject 0.3 mg into the muscle once as needed for anaphylaxis. 08/08/18   Dara Hoyer, FNP  ezetimibe (ZETIA) 10 MG tablet Take 10 mg by mouth daily.    [provider]  finasteride (PROSCAR) 5 MG tablet Take 5 mg by mouth daily. 09/15/20   [provider]  fish oil-omega-3 fatty acids 1000 MG capsule Take 1 g by mouth daily.     [provider]  fluticasone (FLONASE) 50 MCG/ACT nasal spray Place 1 spray into both nostrils daily as needed for allergies.    [provider]  gabapentin (NEURONTIN) 100 MG capsule Take 100-200 mg by mouth See admin instructions. '200mg'$  in the morning, '100mg'$  in the evening, and '200mg'$  at bedtime    [provider]  galantamine (RAZADYNE ER) 8 MG 24 hr capsule Take 8 mg by mouth Daily. 02/26/11   [provider]  hydrALAZINE (APRESOLINE) 25 MG tablet Take 25 mg by mouth 2 (two) times daily. 08/09/20   [provider]  HYDROcodone-acetaminophen (NORCO/VICODIN) 5-325 MG tablet Take 1 tablet by mouth every 6 (six) hours as needed for moderate pain.    [provider]   HYDROcodone-acetaminophen (NORCO/VICODIN) 5-325 MG tablet Take 1 tablet by mouth every 8 (eight) hours as needed for up to 15 doses for severe pain. 03/16/21   Wyvonnia Dusky, MD  isosorbide mononitrate (IMDUR) 60 MG 24 hr tablet TAKE 1 TABLET(60 MG) BY MOUTH DAILY AFTER SUPPER 05/22/21   Cantwell, Celeste C, PA-C  leflunomide (ARAVA) 20 MG tablet Take 10 mg by mouth daily.    [provider]  Magnesium 250 MG TABS Take 250 mg by mouth daily.    [provider]  memantine (NAMENDA) 10 MG tablet Take 10 mg by mouth 2 (two) times daily. 09/08/19   [provider]  metoprolol succinate (  TOPROL-XL) 25 MG 24 hr tablet Take 0.5 tablets (12.5 mg total) by mouth daily as needed (for high heart rate, consistently greater than 110/min.). Patient taking differently: Take 12.5 mg by mouth daily. 02/20/21   Cantwell, Celeste C, PA-C  MOVANTIK 25 MG TABS tablet Take 25 mg by mouth daily. 03/14/21   [provider]  naftifine (NAFTIN) 1 % cream Apply 1 application. topically daily as needed (fungal skin infection).    [provider]  nitroGLYCERIN (NITROSTAT) 0.4 MG SL tablet Place 1 tablet (0.4 mg total) under the tongue every 5 (five) minutes x 3 doses as needed for chest pain. 12/16/19   Cantwell, Celeste C, PA-C  pantoprazole (PROTONIX) 40 MG tablet Take 40 mg by mouth every other day.  10/28/14   [provider]  potassium chloride (MICRO-K) 10 MEQ CR capsule TAKE ONE CAPSULE BY MOUTH EVERY DAY WITH LASIX Patient taking differently: Take 10 mEq by mouth daily. 02/16/21   Cantwell, Celeste C, PA-C  predniSONE (DELTASONE) 5 MG tablet Take 5 mg by mouth daily. Continuous 12/18/20   [provider]  Psyllium Husk POWD Take 1 Scoop by mouth daily.     [provider]  Rivaroxaban (XARELTO) 15 MG TABS tablet Take 15 mg by mouth daily with supper.    [provider]  torsemide (DEMADEX) 20 MG tablet Take 2 tablets (40 mg total) by mouth  daily. 09/16/20   Cantwell, Celeste C, PA-C  triamcinolone cream (KENALOG) 0.1 % Apply 1 application topically daily as needed (skin irritation).    [provider]    Exam: Current vital signs: BP (!) 162/66   Pulse (!) 57   Temp 97.6 F (36.4 C) (Oral)   Resp (!) 22   Ht 6' (1.829 m)   Wt 100.6 kg   SpO2 99%   BMI 30.08 kg/m   Physical Exam  Constitutional: Appears well-developed and well-nourished.  Psych: Affect appropriate to situation Eyes: No scleral injection HENT: No OP obstruction. Head: Normocephalic.  Cardiovascular: Normal rate and regular rhythm.  Respiratory: Effort normal, symmetric excursions bilaterally, no audible wheezing. GI: Soft.  No distension. There is no tenderness.  Skin: WDI  Neuro: Mental Status: Patient is awake, alert, oriented to person, place, month, year, and situation. Patient is able to give a clear and coherent history. Speech mild impaired fluency, intact comprehension and mild impaired repetition. No signs of neglect. Visual Fields are full. Pupils are equal, round, and reactive to light. EOMI without ptosis or diplopia.  Facial sensation is symmetric to temperature Facial movement is symmetric.  Hearing is intact to voice. Uvula midline and palate elevates symmetrically. Shoulder shrug is symmetric. Tongue is midline without atrophy or fasciculations.  Tone is normal. Bulk is normal. 5/5 strength was present in all four extremities. Sensation is symmetric to light touch and temperature in the arms and legs. Deep Tendon Reflexes: 2+ and symmetric in the biceps and patellae. Babinski on R FNF and HKS are intact bilaterally. Gait - Deferred  I have reviewed labs in epic and the pertinent results are: CBG 139  I have reviewed the images obtained: NCT head showed small L MCA territory infarct within the mid-to-posterior left frontal lobe cortex which was not present on prior [facility] 08/17/2020. ASPECTS 9. Additionally,  there is a small chronic appearing infarct in the left cerebellar hemisphere. CTA head and neck showed no LVO and severe multifocal stenosis at the right vertebral artery at the level of the skull base as  well as proximal V4 and right paraclinoid segment.  Assessment: [name] [name] is a 88 y.o. male PMHx atrial fibrillation (rivaroxaban) with small acute stroke in the left MCA territory with residual mild expressive aphasia. He was not a candidate for TNK as he is on rivaroxaban and has had episodes of acute blood loss unknown source.  Impression: Acute left frontal stroke Residual mild aphasia Atrial fibrillation On rivaroxaban NIHSS 1 Iron deficiency secondary to blood loss of unknown source.   Plan: - MRI brain without contrast. - Recommend TTE. - Recommend labs: HbA1c, lipid panel - Ordered. - Recommend Statin for goal LDL <70. - Goal A1c <7. - Continue Rivaroxaban for now - SBP goal <180. - Telemetry monitoring for arrhythmia. - Recommend bedside Swallow screen. - Recommend Stroke education. - Recommend PT/OT/SLP consult.   This patient is critically ill and at significant risk of neurological worsening, death and care requires constant monitoring of vital signs, hemodynamics,respiratory and cardiac monitoring, neurological assessment, discussion with family, other specialists and medical decision making of high complexity. I spent 75 minutes of neurocritical care time  in the care of  this patient. This was time spent independent of any time provided by nurse practitioner or PA.  Electronically signed by:  Lynnae Sandhoff, MD Page: [phone] 05/25/2021, 12:20 PM  If 7pm- 7am, please page neurology on call as listed in [place].

## 2021-05-25 NOTE — Progress Notes (Signed)
Admission assessment complete at this time. VSS. No complaints of pain have been made. Bathroom assistance provided. Patient is Q2 turn, dependent care  Skin swarm complete with dual RN signoff. Patient has 2+ edema to his right lower extremity. A stage 3 full thickness tissue loss to right heel. WOCN placed to assess patients wounds to right upper thigh, right heel and left heel per admission assessment criteria. Q4 NIHSS complete this shift.

## 2021-05-25 NOTE — ED Triage Notes (Signed)
Pt BIB [facility] from home for Code Stroke. At 1050 this AM pt was at a table with family when pt had sudden onset of R arm shaking, stating it didn't feel right and reporting subjective weakness, and slurred speech. Pt takes xarelto, last dose was today. BP 180 palp. CBG 145 with EMS. Ems reports pt is in afib, hx of same. 20g L AC.

## 2021-05-25 NOTE — TOC CAGE-AID Note (Addendum)
Transition of Care [facility]) - CAGE-AID Screening   Patient Details  Name: [name] [name] MRN: [id number] Date of Birth: 08-04-1926  [facility] of [facility]) CM/SW Contact:    Taysen Bushart C Tarpley-Carter, [place] Phone Number: 05/25/2021, 2:43 PM   Clinical Narrative: Pt is unable to participate in Cage Aid. Pt is currently having difficulty speaking. CSW will assess at a better time.  Hernandez Losasso Tarpley-Carter, MSW, LCSW-A Pronouns:  She/Her/Hers [facility]itions [facility] Clinical Social Worker Direct Number:  [phone] [email or web address]  CAGE-AID Screening: Substance Abuse Screening unable to be completed due to: : Patient unable to participate

## 2021-05-26 ENCOUNTER — Inpatient Hospital Stay (HOSPITAL_COMMUNITY): Payer: Medicare Other

## 2021-05-26 DIAGNOSIS — N1831 Chronic kidney disease, stage 3a: Secondary | ICD-10-CM

## 2021-05-26 DIAGNOSIS — I639 Cerebral infarction, unspecified: Secondary | ICD-10-CM | POA: Diagnosis not present

## 2021-05-26 DIAGNOSIS — E785 Hyperlipidemia, unspecified: Secondary | ICD-10-CM | POA: Diagnosis not present

## 2021-05-26 DIAGNOSIS — N189 Chronic kidney disease, unspecified: Secondary | ICD-10-CM | POA: Diagnosis not present

## 2021-05-26 DIAGNOSIS — I6389 Other cerebral infarction: Secondary | ICD-10-CM | POA: Diagnosis not present

## 2021-05-26 LAB — CBC
HCT: 26.5 % — ABNORMAL LOW (ref 39.0–52.0)
Hemoglobin: 8.3 g/dL — ABNORMAL LOW (ref 13.0–17.0)
MCH: 28.6 pg (ref 26.0–34.0)
MCHC: 31.3 g/dL (ref 30.0–36.0)
MCV: 91.4 fL (ref 80.0–100.0)
Platelets: 108 10*3/uL — ABNORMAL LOW (ref 150–400)
RBC: 2.9 MIL/uL — ABNORMAL LOW (ref 4.22–5.81)
RDW: 18 % — ABNORMAL HIGH (ref 11.5–15.5)
WBC: 4.3 10*3/uL (ref 4.0–10.5)
nRBC: 0 % (ref 0.0–0.2)

## 2021-05-26 LAB — BASIC METABOLIC PANEL
Anion gap: 9 (ref 5–15)
BUN: 23 mg/dL (ref 8–23)
CO2: 26 mmol/L (ref 22–32)
Calcium: 9.3 mg/dL (ref 8.9–10.3)
Chloride: 105 mmol/L (ref 98–111)
Creatinine, Ser: 1.62 mg/dL — ABNORMAL HIGH (ref 0.61–1.24)
GFR, Estimated: 40 mL/min — ABNORMAL LOW (ref >60)
Glucose, Bld: 103 mg/dL — ABNORMAL HIGH (ref 70–99)
Potassium: 3.5 mmol/L (ref 3.5–5.1)
Sodium: 140 mmol/L (ref 135–145)

## 2021-05-26 LAB — ECHOCARDIOGRAM COMPLETE
AR max vel: 2.56 cm2
AV Area VTI: 2.43 cm2
AV Area mean vel: 2 cm2
AV Mean grad: 9.9 mmHg
AV Peak grad: 16.3 mmHg
Ao pk vel: 2.02 m/s
Area-P 1/2: 3.51 cm2
Height: 72 in
S' Lateral: 3.4 cm
Weight: 3548.52 oz

## 2021-05-26 MED ORDER — ENSURE ENLIVE PO LIQD
237.0000 mL | Freq: Two times a day (BID) | ORAL | Status: DC
  Administered 2021-05-26 – 2021-05-31 (×9): 237 mL via ORAL

## 2021-05-26 MED ORDER — ADULT MULTIVITAMIN W/MINERALS CH
1.0000 | ORAL_TABLET | Freq: Every day | ORAL | Status: DC
  Administered 2021-05-26 – 2021-05-31 (×6): 1 via ORAL
  Filled 2021-05-26 (×6): qty 1

## 2021-05-26 NOTE — Plan of Care (Signed)
  Problem: Education: Goal: Knowledge of General Education information will improve Description: Including pain rating scale, medication(s)/side effects and non-pharmacologic comfort measures Outcome: Progressing   Problem: Clinical Measurements: Goal: Diagnostic test results will improve Outcome: Progressing   Problem: Coping: Goal: Level of anxiety will decrease Outcome: Progressing   

## 2021-05-26 NOTE — Evaluation (Signed)
Occupational Therapy Evaluation Patient Details Name: [name] [name] MRN: [id number] DOB: Apr 30, 1926 Today's Date: 05/26/2021   History of Present Illness Pt is a 88 y.o. male who presented to the ED 5/25 with c/o R side weakness and difficulty speaking. MRI revealed small areas of acute infarction in the right occipital lobe and left frontal lobe over the convexity; mild to moderate acute infarct left operculum; possible cerebral emboli. PMH:  AAA; CAD s/p CABG; chronic systolic CHF; afib s/p Maze, on Xarelto; HTN; HLD; PMR (currently on steroids); pre-diabetes; OSA on CPAP; PAD with lymphedema; spinal compression fx; and AS s/p TAVR   Clinical Impression   Pt was able to transfer to his motorized chair independently prior to admission. He was assisted 3 x a week by an aide for showering. His wife assisted with LB ADLs and all IADLs. Pt presents with impaired expressive language, R side weakness, significant back pain and impaired standing balance. He needs 2 person assist for all mobility and min to total assist for IADLs. Recommending intensive rehab prior to return home with his supportive wife.      Recommendations for follow up therapy are one component of a multi-disciplinary discharge planning process, led by the attending physician.  Recommendations may be updated based on patient status, additional functional criteria and insurance authorization.   Follow Up Recommendations  Acute inpatient rehab (3hours/day)    Assistance Recommended at Discharge Frequent or constant Supervision/Assistance  Patient can return home with the following Two people to help with walking and/or transfers;Two people to help with bathing/dressing/bathroom;Assistance with cooking/housework;Direct supervision/assist for medications management;Direct supervision/assist for financial management;Assist for transportation;Help with stairs or ramp for entrance    Functional Status Assessment  Patient has had a recent  decline in their functional status and demonstrates the ability to make significant improvements in function in a reasonable and predictable amount of time.  Equipment Recommendations  None recommended by OT    Recommendations for Other Services       Precautions / Restrictions Precautions Precautions: Fall      Mobility Bed Mobility Overal bed mobility: Needs Assistance Bed Mobility: Supine to Sit, Sit to Sidelying     Supine to sit: HOB elevated, Max assist   Sit to sidelying: +2 for physical assistance, Max assist General bed mobility comments: +rail, increased time, cues for sequencing    Transfers Overall transfer level: Needs assistance Equipment used: Rolling walker (2 wheels) Transfers: Sit to/from Stand Sit to Stand: +2 physical assistance, Mod assist, From elevated surface, Max assist           General transfer comment: sit to stand x 3 trials from elevated bed. Pt pulling up on RW. +2 mod initial stance progressed to +2 max with fatigue. Pt unable to take pivot steps.      Balance Overall balance assessment: Needs assistance Sitting-balance support: Feet supported, Bilateral upper extremity supported Sitting balance-Leahy Scale: Fair Sitting balance - Comments: BUE to offload back pain   Standing balance support: Bilateral upper extremity supported, Reliant on assistive device for balance Standing balance-Leahy Scale: Zero Standing balance comment: posterior lean in stance. Pt bracing BLE against bed.                           ADL either performed or assessed with clinical judgement   ADL Overall ADL's : Needs assistance/impaired Eating/Feeding: Minimal assistance;Bed level Eating/Feeding Details (indicate cue type and reason): cued pt to use L hand for  hot drink, increased spillage with R hand Grooming: Set up;Bed level;Wash/dry hands   Upper Body Bathing: Total assistance;Sitting   Lower Body Bathing: +2 for physical assistance;Total  assistance;Sit to/from stand   Upper Body Dressing : Moderate assistance;Sitting   Lower Body Dressing: Total assistance;+2 for physical assistance;Sit to/from stand       Toileting- Water quality scientist and Hygiene: +2 for physical assistance;Total assistance;Sit to/from stand         General ADL Comments: Pt unable to sit without B UE support due to back pain.     Vision Ability to See in Adequate Light: 0 Adequate Patient Visual Report: No change from baseline       Perception     Praxis      Pertinent Vitals/Pain Pain Assessment Pain Assessment: Faces Faces Pain Scale: Hurts whole lot Pain Location: back with sitting Pain Descriptors / Indicators: Grimacing, Discomfort, Moaning Pain Intervention(s): Monitored during session, Repositioned     Hand Dominance Right   Extremity/Trunk Assessment Upper Extremity Assessment Upper Extremity Assessment: RUE deficits/detail RUE Deficits / Details: 4-/5 RUE Coordination: decreased fine motor;decreased gross motor   Lower Extremity Assessment Lower Extremity Assessment: Defer to PT evaluation   Cervical / Trunk Assessment Cervical / Trunk Assessment: Kyphotic;Other exceptions (chronic back pain)   Communication Communication Communication: Expressive difficulties   Cognition Arousal/Alertness: Awake/alert Behavior During Therapy: WFL for tasks assessed/performed Overall Cognitive Status: Impaired/Different from baseline Area of Impairment: Problem solving, Awareness                           Awareness: Emergent Problem Solving: Difficulty sequencing, Requires verbal cues General Comments: Difficult to fully assess due to expressive aphasia     General Comments       Exercises     Shoulder Instructions      Home Living Family/patient expects to be discharged to:: Private residence Living Arrangements: Spouse/significant other Available Help at Discharge: Family;Available 24 hours/day;Personal  care attendant Type of Home: House Home Access: Elevator     Home Layout: Able to live on main level with bedroom/bathroom     Bathroom Shower/Tub: Occupational psychologist: Handicapped height     Home Equipment: [place] (4 wheels);Rolling Walker (2 wheels);Wheelchair - manual;Grab bars - tub/shower;Shower seat;Wheelchair - power          Prior Functioning/Environment Prior Level of Function : Needs assist             Mobility Comments: Mod I transfers. power w/c bound x 3-4 months. Prior to that ambulated short distances with rollator. ADLs Comments: PCA MWF for 3 hours, primarily to assist with bathing/shower, likely assisted for LB dressing and all IADLs        OT Problem List: Decreased strength;Decreased activity tolerance;Impaired balance (sitting and/or standing);Decreased coordination;Decreased cognition;Impaired UE functional use;Pain      OT Treatment/Interventions: Self-care/ADL training;Neuromuscular education;DME and/or AE instruction;Therapeutic activities;Cognitive remediation/compensation;Patient/family education;Balance training    OT Goals(Current goals can be found in the care plan section) Acute Rehab OT Goals OT Goal Formulation: With patient Time For Goal Achievement: 06/09/21 Potential to Achieve Goals: Good ADL Goals Pt Will Perform Eating: with set-up;sitting (with R hand) Pt Will Perform Grooming: with set-up;sitting Pt Will Transfer to Toilet: with mod assist;stand pivot transfer;bedside commode Pt Will Perform Toileting - Clothing Manipulation and hygiene: with min assist;sitting/lateral leans Pt/caregiver will Perform Home Exercise Program: Increased strength;Right Upper extremity;With minimal assist Additional ADL Goal #1: Pt will sit  EOB without UB support and fair balance x 5 minutes in preparation for ADLs. Additional ADL Goal #2: Pt will perform bed mobility with min assist in preparation for ADLs.  OT Frequency: Min  3X/week    Co-evaluation PT/OT/SLP Co-Evaluation/Treatment: Yes Reason for Co-Treatment: For patient/therapist safety   OT goals addressed during session: ADL's and self-care;Strengthening/ROM      AM-PAC OT "6 Clicks" Daily Activity     Outcome Measure Help from another person eating meals?: A Little Help from another person taking care of personal grooming?: A Little Help from another person toileting, which includes using toliet, bedpan, or urinal?: Total Help from another person bathing (including washing, rinsing, drying)?: A Lot Help from another person to put on and taking off regular upper body clothing?: A Lot Help from another person to put on and taking off regular lower body clothing?: Total 6 Click Score: 12   End of Session Equipment Utilized During Treatment: Gait belt;Rolling walker (2 wheels) Nurse Communication: Mobility status  Activity Tolerance: Patient limited by pain Patient left: in bed;with call bell/phone within reach;with bed alarm set;with family/visitor present;with nursing/sitter in room  OT Visit Diagnosis: Unsteadiness on feet (R26.81);Other abnormalities of gait and mobility (R26.89);Pain;Muscle weakness (generalized) (M62.81);Hemiplegia and hemiparesis;Other symptoms and signs involving cognitive function Hemiplegia - Right/Left: Right Hemiplegia - dominant/non-dominant: Dominant Hemiplegia - caused by: Cerebral infarction                Time: 5003-7048 OT Time Calculation (min): 32 min Charges:  OT General Charges $OT Visit: 1 Visit OT Evaluation $OT Eval Moderate Complexity: 1 Mod  Nestor Lewandowsky, OTR/L Acute Rehabilitation Services Pager: [phone] Office: [phone]  Malka So 05/26/2021, 1:21 PM

## 2021-05-26 NOTE — Progress Notes (Addendum)
STROKE TEAM PROGRESS NOTE   INTERVAL HISTORY Patient is seen in his room with his wife at the bedside.  Yesterday, he developed sudden onset aphasia and dysarthria with right sided weakness and shaking of the right arm.  He was found to have multiple small infarcts in bilateral cerebral hemispheres on MRI.  TNK was not administered due to unclear LKW and being on rivaroxaban for atrial fibrillation.  Vitals:   05/25/21 2345 05/26/21 0311 05/26/21 0753 05/26/21 1132  BP:   (!) 113/50 (!) 128/55  Pulse: 66  60 (!) 57  Resp: '17  20 20  '$ Temp:  98 F (36.7 C) 98 F (36.7 C) 97.9 F (36.6 C)  TempSrc:  Oral Oral Oral  SpO2: 98%  92% 96%  Weight:      Height:       CBC:  Recent Labs  Lab 05/25/21 1124 05/25/21 1127 05/26/21 0605  WBC 5.6  --  4.3  NEUTROABS 3.6  --   --   HGB 9.9* 10.5* 8.3*  HCT 31.1* 31.0* 26.5*  MCV 94.2  --  91.4  PLT 123*  --  389*   Basic Metabolic Panel:  Recent Labs  Lab 05/25/21 1124 05/25/21 1127 05/26/21 0605  NA 140 138 140  K 3.5 3.4* 3.5  CL 104 102 105  CO2 27  --  26  GLUCOSE 115* 111* 103*  BUN 26* 30* 23  CREATININE 1.71* 1.70* 1.62*  CALCIUM 10.0  --  9.3   Lipid Panel:  Recent Labs  Lab 05/25/21 1124  CHOL 224*  TRIG 146  HDL 73  CHOLHDL 3.1  VLDL 29  LDLCALC 122*   HgbA1c:  Recent Labs  Lab 05/25/21 1124  HGBA1C 5.8*   Urine Drug Screen: No results for input(s): LABOPIA, COCAINSCRNUR, LABBENZ, AMPHETMU, THCU, LABBARB in the last 168 hours.  Alcohol Level No results for input(s): ETH in the last 168 hours.  IMAGING past 24 hours MR BRAIN WO CONTRAST  Result Date: 05/25/2021 CLINICAL DATA:  Acute neuro deficit.  Stroke. EXAM: MRI HEAD WITHOUT CONTRAST TECHNIQUE: Multiplanar, multiecho pulse sequences of the brain and surrounding structures were obtained without intravenous contrast. COMPARISON:  CT angio head and neck 05/25/2021 FINDINGS: Brain: Small acute infarct right occipital cortex. Small acute infarct in the  left frontal cortex over the convexity. Small to moderate cortical infarct left operculum. Generalized atrophy. Mild chronic microvascular ischemic changes. Small chronic infarcts in the cerebellum bilaterally. Negative for mass. Chronic hemorrhage in the right frontal lobe. Vascular: Normal arterial flow voids. Skull and upper cervical spine: No focal skeletal lesion. Sinuses/Orbits: Paranasal sinuses clear. Bilateral cataract extraction Other: None IMPRESSION: Small areas of acute infarction in the right occipital lobe and left frontal lobe over the convexity. Mild to moderate acute infarct left operculum. Possible cerebral emboli. Atrophy and mild chronic microvascular ischemia Electronically Signed   By: Franchot Gallo M.D.   On: 05/25/2021 15:12   ECHOCARDIOGRAM COMPLETE  Result Date: 05/26/2021    ECHOCARDIOGRAM REPORT   Patient Name:   [name] [name] Date of Exam: 05/26/2021 Medical Rec #:  [id number]     Height:       72.0 in Accession #:    [id number]    Weight:       221.8 lb Date of Birth:  10/29/1926      BSA:          2.226 m Patient Age:    88 years      BP:  113/50 mmHg Patient Gender: M             HR:           63 bpm. Exam Location:  Inpatient Procedure: 2D Echo, Cardiac Doppler and Color Doppler Indications:    Stroke  History:        Patient has prior history of Echocardiogram examinations, most                 recent 12/27/2020. Risk Factors:Hypertension and Sleep Apnea.                 Aortic Valve: 26 mm Sapien prosthetic, stented (TAVR) valve is                 present in the aortic position. Procedure Date: 11/10/19.  Sonographer:    Jefferey Pica Referring Phys: [place]  Sonographer Comments: Image acquisition challenging due to respiratory motion. IMPRESSIONS  1. Left ventricular ejection fraction, by estimation, is 65 to 70%. The left ventricle has normal function. The left ventricle has no regional wall motion abnormalities. There is severe asymmetric left  ventricular hypertrophy of the septal segment. Left  ventricular diastolic parameters are indeterminate.  2. Right ventricular systolic function is normal. The right ventricular size is normal. There is mildly elevated pulmonary artery systolic pressure. The estimated right ventricular systolic pressure is 26.3 mmHg.  3. The mitral valve is degenerative. Trivial mitral valve regurgitation. No evidence of mitral stenosis.  4. The aortic valve has been repaired/replaced. Aortic valve regurgitation is mild and paravalvular. There is a 26 mm Sapien prosthetic (TAVR) valve present in the aortic position. Procedure Date: 11/10/19. Mean systolic gradient 10 mmHg.  5. Aortic dilatation noted. There is mild dilatation of the ascending aorta, measuring 42 mm.  6. The inferior vena cava is normal in size with greater than 50% respiratory variability, suggesting right atrial pressure of 3 mmHg. Conclusion(s)/Recommendation(s): No intracardiac source of embolism detected on this transthoracic study. Consider a transesophageal echocardiogram to exclude cardiac source of embolism if clinically indicated. FINDINGS  Left Ventricle: Left ventricular ejection fraction, by estimation, is 65 to 70%. The left ventricle has normal function. The left ventricle has no regional wall motion abnormalities. The left ventricular internal cavity size was normal in size. There is  severe asymmetric left ventricular hypertrophy of the septal segment. Left ventricular diastolic parameters are indeterminate. Right Ventricle: The right ventricular size is normal. No increase in right ventricular wall thickness. Right ventricular systolic function is normal. There is mildly elevated pulmonary artery systolic pressure. The tricuspid regurgitant velocity is 3.02  m/s, and with an assumed right atrial pressure of 3 mmHg, the estimated right ventricular systolic pressure is 33.5 mmHg. Left Atrium: Left atrial size was normal in size. Right Atrium: Right  atrial size was normal in size. Pericardium: Trivial pericardial effusion is present. Presence of epicardial fat layer. Mitral Valve: The mitral valve is degenerative in appearance. Mild to moderate mitral annular calcification. Trivial mitral valve regurgitation. No evidence of mitral valve stenosis. Tricuspid Valve: The tricuspid valve is normal in structure. Tricuspid valve regurgitation is mild . No evidence of tricuspid stenosis. Aortic Valve: The aortic valve has been repaired/replaced. Aortic valve regurgitation is mild. No aortic stenosis is present. Aortic valve mean gradient measures 9.9 mmHg. Aortic valve peak gradient measures 16.3 mmHg. Aortic valve area, by VTI measures 2.43 cm. There is a 26 mm Sapien prosthetic, stented (TAVR) valve present in the aortic position. Procedure Date: 11/10/19. Pulmonic  Valve: The pulmonic valve was normal in structure. Pulmonic valve regurgitation is trivial. No evidence of pulmonic stenosis. Aorta: Aortic dilatation noted and the aortic root was not well visualized. There is mild dilatation of the ascending aorta, measuring 42 mm. Venous: The inferior vena cava is normal in size with greater than 50% respiratory variability, suggesting right atrial pressure of 3 mmHg. IAS/Shunts: No atrial level shunt detected by color flow Doppler.  LEFT VENTRICLE PLAX 2D LVIDd:         5.20 cm   Diastology LVIDs:         3.40 cm   LV e' lateral:   5.37 cm/s LV PW:         1.30 cm   LV E/e' lateral: 22.0 LV IVS:        1.60 cm LVOT diam:     2.20 cm LV SV:         120 LV SV Index:   54 LVOT Area:     3.80 cm  IVC IVC diam: 2.10 cm LEFT ATRIUM           Index LA diam:      3.90 cm 1.75 cm/m LA Vol (A2C): 49.8 ml 22.37 ml/m LA Vol (A4C): 62.9 ml 28.25 ml/m  AORTIC VALVE                     PULMONIC VALVE AV Area (Vmax):    2.56 cm      PV Vmax:       0.75 m/s AV Area (Vmean):   2.00 cm      PV Peak grad:  2.3 mmHg AV Area (VTI):     2.43 cm AV Vmax:           202.10 cm/s AV Vmean:           169.689 cm/s AV VTI:            0.495 m AV Peak Grad:      16.3 mmHg AV Mean Grad:      9.9 mmHg LVOT Vmax:         136.00 cm/s LVOT Vmean:        89.400 cm/s LVOT VTI:          0.316 m LVOT/AV VTI ratio: 0.64  AORTA Ao Root diam: 3.70 cm Ao Asc diam:  4.15 cm MITRAL VALVE                TRICUSPID VALVE MV Area (PHT): 3.51 cm     TR Peak grad:   36.5 mmHg MV Decel Time: 216 msec     TR Vmax:        302.00 cm/s MV E velocity: 118.00 cm/s MV A velocity: 25.10 cm/s   SHUNTS MV E/A ratio:  4.70         Systemic VTI:  0.32 m                             Systemic Diam: 2.20 cm Cherlynn Kaiser MD Electronically signed by Cherlynn Kaiser MD Signature Date/Time: 05/26/2021/11:59:05 AM    Final     PHYSICAL EXAM General:  Alert, well-developed, well-nourished elderly patient in no acute distress Respiratory:  Regular, unlabored respirations on room air  NEURO:  Mental Status: AA&Ox3  Speech/Language: speech is nonfluent with some dysarthria and aphasia.  Naming is intact but repetition is impaired.  Paraphasic errors present.  Good comprehension.  Cranial Nerves:  II: PERRL. Visual fields full.  III, IV, VI: EOMI. Eyelids elevate symmetrically.  V: Sensation is intact to light touch and symmetrical to face.  VII: Smile is symmetrical.   VIII: hearing intact to voice. IX, X: Phonation is normal.  NT:[id number] shrug 5/5. XII: tongue is midline without fasciculations. Motor: 5/5 strength to all muscle groups tested.  Tone: is normal and bulk is normal Sensation- Intact to light touch bilaterally.  Coordination: FTN intact bilaterally, drift in BLE but worse on right   Gait- deferred     ASSESSMENT/PLAN [name] is a 88 y.o. male with history of atrial fibrillation on rivaroxaban, blood loss of unknown source s/p transfusion, recent Mohs surgery for squamous cell carcinoma, CAD, CHF, migraines, MI, CABG, TAVR and OSA on CPAP presenting with sudden onset aphasia and dysarthria with right  sided weakness and shaking of the right arm.  He was found to have multiple small infarcts in bilateral cerebral hemispheres on MRI.  TNK was not administered dur to unclear LKW and use of rivaroxaban for atrial fibrillation.  Stroke:  bilateral small infarcts in right occiptial lobe, left frontal lobe and left operculum likely secondary due to embolism in setting of known atrial fibrillation on anticoagulation Code Stroke CT head small left MCA territory infarct in left frontal lobe Small vessel disease. Atrophy. ASPECTS 9   CTA head & neck moderate stenosis of left V4 vertebral artery, moderate stenosis of right paraclinoid ICA MRI  small areas of acute infarction in right occipital lobe, left frontal lobe and left operculum 2D Echo EF 65-70%, LVH in septal segment, prosthetic aortic valve, no atrial level shunt LDL 122 HgbA1c 5.8 VTE prophylaxis - SCDs    Diet   Diet Heart Room service appropriate? Yes; Fluid consistency: Thin   Xarelto (rivaroxaban) daily prior to admission, now on Xarelto (rivaroxaban) daily.  Therapy recommendations:  CIR Disposition:  pending  Atrial fibrillation Patient has known atrial fibrillation Taking Xarelto and amiodarone at home Will continue Xarelto for now, consider switching to Eliquis  Hypertension Home meds:  none Stable Permissive hypertension (OK if < 220/120) but gradually normalize in 5-7 days Long-term BP goal normotensive  Hyperlipidemia Home meds:  none LDL 122, goal < 70 Add atorvastatin 40 mg daily  Continue statin at discharge   Other Stroke Risk Factors Advanced Age >/= 54  Former cigarette smoker Obesity, Body mass index is 30.08 kg/m., BMI >/= 30 associated with increased stroke risk, recommend weight loss, diet and exercise as appropriate  Coronary artery disease Migraines Obstructive sleep apnea, on CPAP at home Congestive heart failure  Other Active Problems Mild dementia Continue home memantine  Hospital day #  [place] , MSN, AGACNP-BC [facility] See Amion for schedule and pager information 05/26/2021 2:56 PM   STROKE MD NOTE :  I have personally obtained history,examined this patient, reviewed notes, independently viewed imaging studies, participated in medical decision making and plan of care.ROS completed by me personally and pertinent positives fully documented  I have made any additions or clarifications directly to the above note. Agree with note above.  Patient presented with aphasia and speech difficulties due to embolic infarcts in the setting of Atrial fibrillation despite being on anticoagulation with Xarelto.  Recommend consider switching Xarelto to Eliquis even though there is no definitive data suggesting any superiority.  Long discussion with the patient and wife about this and they are agreeable with switching.  Continue ongoing  stroke work-up and aggressive risk factor modification.  Mobilize out of bed.  Physical occupational and speech therapy.  Discussed with Dr.Ghimire.  Greater than 50% time during this 50-minute visit was spent in counseling and coordination of care his embolic stroke and discussion about treatment options for atrial fibrillation and stroke prevention and answering questions Antony Contras, MD Medical Director [facility] Pager: [phone] 05/26/2021 3:03 PM     To contact Stroke Continuity provider, please refer to [email or web address] After hours, contact General Neurology

## 2021-05-26 NOTE — Plan of Care (Signed)
  Problem: Education: Goal: Knowledge of disease or condition will improve 05/26/2021 0731 by Vonna Kotyk, RN Outcome: Progressing 05/25/2021 1848 by Vonna Kotyk, RN Outcome: Progressing Goal: Knowledge of secondary prevention will improve (SELECT ALL) 05/26/2021 0731 by Vonna Kotyk, RN Outcome: Progressing 05/25/2021 1848 by Vonna Kotyk, RN Outcome: Progressing Goal: Knowledge of patient specific risk factors will improve (INDIVIDUALIZE FOR PATIENT) 05/26/2021 0731 by Vonna Kotyk, RN Outcome: Progressing 05/25/2021 1848 by Vonna Kotyk, RN Outcome: Progressing

## 2021-05-26 NOTE — Progress Notes (Signed)
Initial Nutrition Assessment  DOCUMENTATION CODES:   Obesity unspecified  INTERVENTION:  - Feeding with assistance  - Order dysphagia 3 diet for ease of chewing and swallowing  - Ensure Enlive po BID, each supplement provides 350 kcal and 20 grams of protein.  - MVI with minerals daily  - Recommend SLP consult for swallow evaluation, reached out to MD with recommendation  NUTRITION DIAGNOSIS:   Increased nutrient needs related to acute illness as evidenced by estimated needs.  GOAL:   Patient will meet greater than or equal to 90% of their needs  MONITOR:   PO intake, Supplement acceptance, Labs, Weight trends  REASON FOR ASSESSMENT:   Consult Assessment of nutrition requirement/status  ASSESSMENT:   Pt admitted with stroke-like symptoms found to have acute CVA. PMH significant for AAA, CAD s/p CABG, chronic systolic CHF, afib s/p Maze, HTN, HLD, PMR (on steroids), pre-diabetes, OSA, PAD with lymphedema and AS s/p TAVR.  Pt states that at home he had been eating well up until his the onset of his CVA. He states that at home he usually eats 3 meals per day which consist of granola with coffee for breakfast, a big bowl of soup for lunch with coffee and an orange, and a full meal for dinner consisting of meat, vegetables and ice cream for dessert. He continues with difficulty finding words during assessment. Noted breakfast tray with 100% eaten of cereal and fresh fruit cup. Pt endorses needing feeding assistance as he is having difficulty self feeding. His wife assisted with breakfast and appreciates recommendation for assistance with further meals. Pt also endorses some difficulty with chewing and swallowing since CVA. Noted SLP evaluation for cognition. Spoke with ST about concern for swallowing difficulties. Reached out to MD with recommendation for swallow evaluation by ST. Obtained pt's lunch preferences and placed order as he was ready to eat. Ordered vegetable brother,  tilapia, roll, coffee with milk and strawberry popsicle. Spoke with RN about possible need for assistance with meals.   Pt states that his usual weight is ~225 lbs and denies recent declines in wt. Pt noted to fluctuate between 95.3-100.6 kg within the last year. Pt with h/o CHF and other heart disease which would likely contribute to wt fluctuations.   Pt not meeting his nutritional needs with meals that he is ordering. He is not likely exceeding nutritional limits and would benefit from a liberalized diet to provide widest variety of menu options for adequate PO intake.   Medications: protonix, prednisone  Labs: Cr 1.62, ionized calcium 1.07, CBG's 139, HgbA1c 5.8%  NUTRITION - FOCUSED PHYSICAL EXAM:  Flowsheet Row Most Recent Value  Orbital Region Mild depletion  Upper Arm Region No depletion  Thoracic and Lumbar Region No depletion  Buccal Region No depletion  Temple Region Mild depletion  Clavicle Bone Region No depletion  Clavicle and Acromion Bone Region No depletion  Scapular Bone Region No depletion  Dorsal Hand No depletion  Patellar Region No depletion  Anterior Thigh Region No depletion  Posterior Calf Region No depletion  Edema (RD Assessment) Mild  [generalized]  Hair Reviewed  Eyes Reviewed  Mouth Reviewed  Skin Reviewed  Nails Reviewed      Diet Order:   Diet Order             DIET DYS 3 Room service appropriate? Yes; Fluid consistency: Thin  Diet effective now                   EDUCATION  NEEDS:   Not appropriate for education at this time  Skin:  Skin Assessment: Skin Integrity Issues: Skin Integrity Issues:: Stage III Stage III: per WOC: Right heel with healing Stage 3 pressure injury; 2X5X.1cm, pink and dry  Last BM:  5/25 (type 5)  Height:   Ht Readings from Last 1 Encounters:  05/25/21 6' (1.829 m)    Weight:   Wt Readings from Last 1 Encounters:  05/25/21 100.6 kg    Ideal Body Weight:  80.9 kg  BMI:  Body mass index is  30.08 kg/m.  Estimated Nutritional Needs:   Kcal:  2100-2300  Protein:  105-120g  Fluid:  >/=2.1L  [name] [name], RDN, LDN Clinical Nutrition

## 2021-05-26 NOTE — Consult Note (Addendum)
[facility] Nurse Consult Note: Reason for Consult: Consult requested for bilat legs.  Wound type: Left leg is in an IT trainer.  Pt states he had a MOHS procedure performed and the compression wrap is ordered to remain in place until he has a follow-up appointment with the physician.  Pt states he has been followed by the outpatient wound care center prior to admission.  Right upper thigh with healing full thickness wound; .5X.5X.2cm, red and moist Left 4th toe nail is black and dry, no open wound or drainage. Right outer ankle with healing full thickness wound; 1.5X.3X.2cm Right heel with healing Stage 3 pressure injury; 2X5X.1cm, pink and dry Pressure Injury POA: Yes Dressing procedure/placement/frequency: Continue plan of care as previously ordered by the outpatient wound care center:  1. Foam dressing to left toenail, right thigh. Change Q 3 days or PRN soiling. 2. Cut small piece of Aquacel Kellie Simmering # [id number]) and apply to right outer ankle and cover with foam, change Q M/W/F  3. LEAVE RIGHT UNNA BOOT IN PLACE; pt plans to have it changed by an outpatient physician after discharge 4. Leave right heel cup and mesh to hold in place to right heel with compression stocking in place, do not throw away.  Remove compression sock each shift to assess skin. 5. Float bilat heels to reduce pressure  Pt should resume follow-up at the outpatient wound care center after discharge.  Please re-consult if further assistance is needed.  Thank-you,  Julien Girt MSN, [place], [place], [place], [place]

## 2021-05-26 NOTE — Progress Notes (Signed)
PROGRESS NOTE        PATIENT DETAILS Name: [name] [name] Age: 88 y.o. Sex: male Date of Birth: [birth year] Admit Date: 05/25/2021 Admitting Physician Karmen Bongo, MD MEQ:ASTMHD, Elta Gu[place], MD  Brief Summary: Patient is a 88 year old male with with history of CAD s/p CABG (2014), paroxysmal atrial fibrillation s/p maze procedure-on Xarelto, AAS-s/p TAVR (2021), HFpEF HTN, HLD, PMR, OSA on CPAP, PAD s/p lymphedema-presented with dysarthria, right sided weakness-patient was found to have acute CVA and subsequently admitted to the hospitalist service.  See below for further details.   Significant events: 5/25>> dysarthria/right-sided weakness-CVA on MRI-admit to [facility].  Significant studies: 5/25>> CT head: Small left MCA territory cortical infarct 5/25>> CTA head: No intracranial LVO-multifocal severe stenosis within the right vertebral artery, moderate stenosis proximal V4 left vertebral artery, moderate stenosis paraclinoid right ICA 5/25>> CTA neck: No LVO-no hemodynamically significant stenosis. 5/25>>MRI Brain: Acute infarction right occipital lobe, left frontal lobe, left operculum 5/25>> A1c 5.8 5/25>> LDL: 122  Significant microbiology data:   Procedures: None  Consults: Neurology  Subjective: Lying comfortably in bed-denies any chest pain or shortness of breath.  Continues to have dysarthria-claims that his right side still does not feel "right"  Objective: Vitals: Blood pressure (!) 113/50, pulse 60, temperature 98 F (36.7 C), temperature source Oral, resp. rate 20, height 6' (1.829 m), weight 100.6 kg, SpO2 92 %.   Exam: Gen Exam:Alert awake-not in any distress HEENT:atraumatic, normocephalic Chest: B/L clear to auscultation anteriorly CVS:S1S2 regular Abdomen:soft non tender, non distended Extremities:no edema Neurology: Very subtle RUE weakness-continues to have dysarthria. Skin: no rash  Pertinent Labs/Radiology:    Latest Ref Rng &  Units 05/26/2021    6:05 AM 05/25/2021   11:27 AM 05/25/2021   11:24 AM  CBC  WBC 4.0 - 10.5 K/uL 4.3    5.6    Hemoglobin 13.0 - 17.0 g/dL 8.3   10.5   9.9    Hematocrit 39.0 - 52.0 % 26.5   31.0   31.1    Platelets 150 - 400 K/uL 108    123      Lab Results  Component Value Date   NA 140 05/26/2021   K 3.5 05/26/2021   CL 105 05/26/2021   CO2 26 05/26/2021     Assessment/Plan: Acute CVA: Embolic-claims compliance to Xarelto-awaiting echo-given suspicion for Xarelto failure-May need to be switched to Eliquis.  Await further recommendations from stroke MD/neurology.  PT/OT eval.  PAF: Maintaining sinus rhythm-continue amiodarone-see above regarding anticoagulation.  HLD: LDL not at goal-now on Lipitor 40 mg daily-also on Zetia.    HTN: BP relatively stable-continue Imdur  CAD s/p CABG 2014: No anginal symptoms  History of severe AS-s/p TAVR procedure 2021: Await repeat echo  Chronic HFpEF: Euvolemic-resume diuretics.  PAD s/p balloon angioplasty of posterior tibial artery October 2022: Vascular surgery in the outpatient setting  History of RA/PMR: On prednisone and leflunomide.  Normocytic anemia: Attributed to CKD/chronic disease-being followed by Dr. Alen Blew in the outpatient setting  CKD stage IIIb: Creatinine close to baseline-follow periodically.  UTI: Diagnosed prior to this hospitalization-on Omnicef.  BPH: Stable-continue finasteride  Dementia: Appears to be mild-continue Namenda and galantamine  Bilateral lower extremity wounds: Being followed by wound care in the outpatient setting  Obesity: Estimated body mass index is 30.08 kg/m as calculated from the following:   Height as  of this encounter: 6' (1.829 m).   Weight as of this encounter: 100.6 kg.   Code status:   Code Status: DNR   DVT Prophylaxis: Rivaroxaban (XARELTO) tablet 15 mg    Family Communication: None at bedside   Disposition Plan: Status is: Inpatient Remains inpatient appropriate  because: Acute CVA-awaiting work-up-PT/OT eval.   Planned Discharge Destination:Home health   Diet: Diet Order             Diet Heart Room service appropriate? Yes; Fluid consistency: Thin  Diet effective ____                     Antimicrobial agents: Anti-infectives (From admission, onward)    Start     Dose/Rate Route Frequency Ordered Stop   05/25/21 2200  cefdinir (OMNICEF) capsule 300 mg       Note to Pharmacy: Started on 05-23-21 7 days   300 mg Oral 2 times daily 05/25/21 1522          MEDICATIONS: Scheduled Meds:  amiodarone  200 mg Oral Daily   aspirin  81 mg Oral Daily   atorvastatin  40 mg Oral Daily   cefdinir  300 mg Oral BID   ezetimibe  10 mg Oral Daily   finasteride  5 mg Oral Daily   gabapentin  200 mg Oral BID   And   gabapentin  100 mg Oral QPM   galantamine  8 mg Oral Q breakfast   isosorbide mononitrate  60 mg Oral QPC supper   leflunomide  10 mg Oral Daily   memantine  10 mg Oral BID   [START ON 05/27/2021] pantoprazole  40 mg Oral QODAY   predniSONE  5 mg Oral Q breakfast   Rivaroxaban  15 mg Oral Q supper   Continuous Infusions: PRN Meds:.acetaminophen **OR** acetaminophen (TYLENOL) oral liquid 160 mg/5 mL **OR** acetaminophen, hydrALAZINE, senna-docusate   I have personally reviewed following labs and imaging studies  LABORATORY DATA: CBC: Recent Labs  Lab 05/25/21 1124 05/25/21 1127 05/26/21 0605  WBC 5.6  --  4.3  NEUTROABS 3.6  --   --   HGB 9.9* 10.5* 8.3*  HCT 31.1* 31.0* 26.5*  MCV 94.2  --  91.4  PLT 123*  --  108*    Basic Metabolic Panel: Recent Labs  Lab 05/25/21 1124 05/25/21 1127 05/26/21 0605  NA 140 138 140  K 3.5 3.4* 3.5  CL 104 102 105  CO2 27  --  26  GLUCOSE 115* 111* 103*  BUN 26* 30* 23  CREATININE 1.71* 1.70* 1.62*  CALCIUM 10.0  --  9.3    GFR: Estimated Creatinine Clearance: 35.7 mL/min (A) (by C-G formula based on SCr of 1.62 mg/dL (H)).  Liver Function Tests: Recent Labs  Lab  05/25/21 1124  AST 27  ALT 20  ALKPHOS 94  BILITOT 0.6  PROT 6.8  ALBUMIN 3.3*   No results for input(s): LIPASE, AMYLASE in the last 168 hours. No results for input(s): AMMONIA in the last 168 hours.  Coagulation Profile: Recent Labs  Lab 05/25/21 1124  INR 1.6*    Cardiac Enzymes: No results for input(s): CKTOTAL, CKMB, CKMBINDEX, TROPONINI in the last 168 hours.  BNP (last 3 results) No results for input(s): PROBNP in the last 8760 hours.  Lipid Profile: Recent Labs    05/25/21 1124  CHOL 224*  HDL 73  LDLCALC 122*  TRIG 146  CHOLHDL 3.1    Thyroid Function Tests: No  results for input(s): TSH, T4TOTAL, FREET4, T3FREE, THYROIDAB in the last 72 hours.  Anemia Panel: No results for input(s): VITAMINB12, FOLATE, FERRITIN, TIBC, IRON, RETICCTPCT in the last 72 hours.  Urine analysis:    Component Value Date/Time   COLORURINE YELLOW 03/14/2021 0011   APPEARANCEUR HAZY (A) 03/14/2021 0011   LABSPEC 1.014 03/14/2021 0011   PHURINE 6.0 03/14/2021 0011   GLUCOSEU NEGATIVE 03/14/2021 0011   HGBUR SMALL (A) 03/14/2021 0011   BILIRUBINUR NEGATIVE 03/14/2021 0011   KETONESUR NEGATIVE 03/14/2021 0011   PROTEINUR NEGATIVE 03/14/2021 0011   UROBILINOGEN 1.0 03/22/2012 0845   NITRITE NEGATIVE 03/14/2021 0011   LEUKOCYTESUR MODERATE (A) 03/14/2021 0011    Sepsis Labs: Lactic Acid, Venous    Component Value Date/Time   LATICACIDVEN 1.3 10/22/2020 1417    MICROBIOLOGY: No results found for this or any previous visit (from the past 240 hour(s)).  RADIOLOGY STUDIES/RESULTS: MR BRAIN WO CONTRAST  Result Date: 05/25/2021 CLINICAL DATA:  Acute neuro deficit.  Stroke. EXAM: MRI HEAD WITHOUT CONTRAST TECHNIQUE: Multiplanar, multiecho pulse sequences of the brain and surrounding structures were obtained without intravenous contrast. COMPARISON:  CT angio head and neck 05/25/2021 FINDINGS: Brain: Small acute infarct right occipital cortex. Small acute infarct in the left  frontal cortex over the convexity. Small to moderate cortical infarct left operculum. Generalized atrophy. Mild chronic microvascular ischemic changes. Small chronic infarcts in the cerebellum bilaterally. Negative for mass. Chronic hemorrhage in the right frontal lobe. Vascular: Normal arterial flow voids. Skull and upper cervical spine: No focal skeletal lesion. Sinuses/Orbits: Paranasal sinuses clear. Bilateral cataract extraction Other: None IMPRESSION: Small areas of acute infarction in the right occipital lobe and left frontal lobe over the convexity. Mild to moderate acute infarct left operculum. Possible cerebral emboli. Atrophy and mild chronic microvascular ischemia Electronically Signed   By: Franchot Gallo M.D.   On: 05/25/2021 15:12   CT HEAD CODE STROKE WO CONTRAST  Addendum Date: 05/25/2021   ADDENDUM REPORT: 05/25/2021 12:40 ADDENDUM: Impression #1 called by telephone at the time of interpretation on 05/25/2021 at 11:50 am to provider Dr. Theda Sers, Who verbally acknowledged these results. Electronically Signed   By: Kellie Simmering D.O.   On: 05/25/2021 12:40   Result Date: 05/25/2021 CLINICAL DATA:  Code stroke. Provided history: Neuro deficit, acute, stroke suspected. EXAM: CT HEAD WITHOUT CONTRAST TECHNIQUE: Contiguous axial images were obtained from the base of the skull through the vertex without intravenous contrast. RADIATION DOSE REDUCTION: This exam was performed according to the departmental dose-optimization program which includes automated exposure control, adjustment of the mA and/or kV according to patient size and/or use of iterative reconstruction technique. COMPARISON:  Prior head CT examinations 08/17/2020 and earlier. FINDINGS: Brain: Mild-to-moderate generalized cerebral atrophy. Small left MCA territory cortical infarct within the mid-to-posterior left frontal lobe, new from the prior head CT of 08/17/2020 and likely acute (series 2, images 8-10). Mild ill-defined  hypoattenuation within the cerebral white matter, nonspecific but compatible with chronic small vessel ischemic disease. Redemonstrated small chronic infarct within the left cerebellar hemisphere. There is no acute intracranial hemorrhage. No extra-axial fluid collection. No evidence of an intracranial mass. No midline shift. Vascular: No hyperdense vessel. Atherosclerotic calcifications. Skull: No fracture or aggressive osseous lesion. Sinuses/Orbits: No mass or acute finding within the imaged orbits. Mild mucosal thickening and small mucous retention cysts within the bilateral ethmoid air cells. ASPECTS [facility] Stroke Program Early CT Score) - Ganglionic level infarction (caudate, lentiform nuclei, internal capsule, insula, M1-M3 cortex): 7 -  Supraganglionic infarction (M4-M6 cortex): 2 Total score (0-10 with 10 being normal): 9 Attempts are being made to reach the ordering provider at this time. IMPRESSION: Small left MCA territory cortical infarct within the mid-to-posterior left frontal lobe, new from the prior head CT of 08/17/2020 and likely acute. ASPECTS is 9. Mild chronic small vessel ischemic changes within the cerebral white matter. Redemonstrated small chronic infarct within the left cerebellar hemisphere. Mild-to-moderate generalized cerebral atrophy. Bilateral ethmoid sinusitis. Electronically Signed: By: Kellie Simmering D.O. On: 05/25/2021 11:43   CT ANGIO HEAD NECK W WO CM (CODE STROKE)  Result Date: 05/25/2021 CLINICAL DATA:  Provided history: Neuro deficit, acute, stroke suspected; acute right-sided weakness with expressive aphasia. EXAM: CT ANGIOGRAPHY HEAD AND NECK TECHNIQUE: Multidetector CT imaging of the head and neck was performed using the standard protocol during bolus administration of intravenous contrast. Multiplanar CT image reconstructions and MIPs were obtained to evaluate the vascular anatomy. Carotid stenosis measurements (when applicable) are obtained utilizing NASCET criteria,  using the distal internal carotid diameter as the denominator. RADIATION DOSE REDUCTION: This exam was performed according to the departmental dose-optimization program which includes automated exposure control, adjustment of the mA and/or kV according to patient size and/or use of iterative reconstruction technique. CONTRAST:  85m OMNIPAQUE IOHEXOL 350 MG/ML SOLN COMPARISON:  Noncontrast head CT performed immediately prior 05/25/2021. FINDINGS: CTA NECK FINDINGS Aortic arch: Standard aortic branching. Atherosclerotic plaque within the visualized aortic arch and proximal major branch vessels of the neck. No hemodynamically significant innominate or proximal subclavian artery stenosis. Right carotid system: CCA and ICA patent within the neck. Soft and calcified atherosclerotic plaque about the carotid bifurcation and within the proximal ICA, resulting in less than 50% stenosis at the ICA origin. Left carotid system: CCA and ICA patent within the neck. Predominantly calcified plaque within the CCA, bout the carotid bifurcation and within the proximal ICA without measurable stenosis. Vertebral arteries: Vertebral arteries patent within the neck. The left vertebral artery is dominant. Nonstenotic calcified plaque at the origin of the left vertebral artery. Skeleton: Straightening of the expected cervical lordosis. 2 mm C4-C5 grade 1 anterolisthesis. Cervical spondylosis. Vertebral ankylosis at C2-C3 and C5-C6. No acute fracture or aggressive osseous lesion. Other neck: No neck mass or cervical lymphadenopathy. Upper chest: Prior median sternotomy. Incompletely imaged bilateral pleural effusions. Interlobular septal thickening and ground-glass opacity within the imaged lung apices, likely reflecting edema. Review of the MIP images confirms the above findings CTA HEAD FINDINGS Anterior circulation: The intracranial internal carotid arteries are patent. Calcified plaque within both vessels. Moderate stenosis of the right  paraclinoid segment. Otherwise, no more than mild stenosis of the intracranial ICAs. The M1 middle cerebral arteries are patent. No M2 proximal branch occlusion or high-grade proximal stenosis. The anterior cerebral arteries are patent. Posterior circulation: Severe atherosclerotic irregularity with severe multifocal stenoses in the right vertebral artery at the level of skull base and intracranially. Calcified plaque within the proximal V4 left vertebral artery with moderate stenosis. The basilar artery is patent. The posterior cerebral arteries are patent. The left PCA is fetal in origin. The right posterior communicating artery is diminutive or absent. Venous sinuses: Within the limitations of contrast timing, no convincing thrombus. Anatomic variants: As described. Review of the MIP images confirms the above findings No emergent large vessel occlusion. This finding and CTA head impression #3 called by telephone at the time of interpretation on 05/25/2021 at 11:50 am to provider [facility], who verbally acknowledged these results. IMPRESSION: CTA neck:  1. The common carotid and internal carotid arteries are patent within the neck. Atherosclerotic plaque bilaterally, without hemodynamically significant stenosis. 2. Vertebral arteries patent within the neck. Non-stenotic calcified plaque at the origin of the left vertebral artery. 3. Pulmonary edema within the imaged lung apices. 4. Incompletely imaged bilateral pleural effusions. 5. Cervical spondylosis. C2-C3 and C5-C6 vertebral ankylosis. CTA head: 1. No intracranial large vessel occlusion is identified. 2. Intracranial atherosclerotic disease with multifocal stenoses, most notably as follows. 3. Multifocal severe stenoses within the right vertebral artery at the level of the skull base and intracranially. 4. Moderate stenosis within the proximal V4 left vertebral artery. 5. Moderate stenosis of the paraclinoid right ICA. Electronically Signed   By: Kellie Simmering D.O.   On: 05/25/2021 12:15     LOS: 1 day   Oren Binet, MD  [facility]    To contact the attending provider between 7A-7P or the covering provider during after hours 7P-7A, please log into the web site [email or web address] and access using universal [place] password for that web site. If you do not have the password, please call the hospital operator.  05/26/2021, 8:12 AM

## 2021-05-26 NOTE — Evaluation (Signed)
Physical Therapy Evaluation Patient Details Name: [name] [name] MRN: [id number] DOB: 1926/08/28 Today's Date: 05/26/2021  History of Present Illness  Pt is a 88 y.o. male who presented to the ED 5/25 with c/o R side weakness and difficulty speaking. MRI revealed small areas of acute infarction in the right occipital lobe and left frontal lobe over the convexity; mild to moderate acute infarct left operculum; possible cerebral emboli. PMH:  AAA; CAD s/p CABG; chronic systolic CHF; afib s/p Maze, on Xarelto; HTN; HLD; PMR (currently on steroids); pre-diabetes; OSA on CPAP; PAD with lymphedema; spinal compression fx; and AS s/p TAVR   Clinical Impression  Pt admitted with above diagnosis. PTA pt lived at home with wife, power w/c dependent and mod I transfers. Pt currently with functional limitations due to the deficits listed below (see PT Problem List). On eval, pt required +1-2 max bed mobility, and +2 mod to max sit to stand with RW from elevated bed. Mobility limited by back pain. Pt demonstrates deficits in strength, balance, and activity tolerance. Expressive difficulties noted with pt becoming appropriately frustrated. Pt will benefit from skilled PT to increase their independence and safety with mobility to allow discharge to the venue listed below.          Recommendations for follow up therapy are one component of a multi-disciplinary discharge planning process, led by the attending physician.  Recommendations may be updated based on patient status, additional functional criteria and insurance authorization.  Follow Up Recommendations Acute inpatient rehab (3hours/day)    Assistance Recommended at Discharge PRN  Patient can return home with the following  Assistance with cooking/housework;A lot of help with bathing/dressing/bathroom;Two people to help with walking and/or transfers;Assist for transportation    Equipment Recommendations None recommended by PT  Recommendations for Other  Services  Rehab consult    Functional Status Assessment Patient has had a recent decline in their functional status and demonstrates the ability to make significant improvements in function in a reasonable and predictable amount of time.     Precautions / Restrictions Precautions Precautions: Fall      Mobility  Bed Mobility Overal bed mobility: Needs Assistance Bed Mobility: Supine to Sit, Sit to Sidelying     Supine to sit: HOB elevated, Max assist   Sit to sidelying: +2 for physical assistance, Max assist General bed mobility comments: +rail, increased time, cues for sequencing    Transfers Overall transfer level: Needs assistance Equipment used: Rolling walker (2 wheels) Transfers: Sit to/from Stand Sit to Stand: +2 physical assistance, Mod assist, From elevated surface, Max assist           General transfer comment: sit to stand x 3 trials from elevated bed. Pt pulling up on RW. +2 mod initial stance progressed to +2 max with fatigue. Pt unable to take pivot steps.    Ambulation/Gait               General Gait Details: unable  Stairs            Wheelchair Mobility    Modified Rankin (Stroke Patients Only)       Balance Overall balance assessment: Needs assistance Sitting-balance support: Feet supported, Bilateral upper extremity supported Sitting balance-Leahy Scale: Fair Sitting balance - Comments: BUE to offload back pain   Standing balance support: Bilateral upper extremity supported, Reliant on assistive device for balance Standing balance-Leahy Scale: Poor Standing balance comment: posterior lean in stance. Pt bracing BLE against bed.  Pertinent Vitals/Pain Pain Assessment Pain Assessment: Faces Faces Pain Scale: Hurts whole lot Pain Location: back with sitting Pain Descriptors / Indicators: Grimacing, Discomfort Pain Intervention(s): Repositioned, Limited activity within patient's tolerance     Home Living Family/patient expects to be discharged to:: Private residence Living Arrangements: Spouse/significant other Available Help at Discharge: Family;Available 24 hours/day;Personal care attendant Type of Home: House Home Access: Elevator       Home Layout: Able to live on main level with bedroom/bathroom Home Equipment: Rollator (4 wheels);Rolling Walker (2 wheels);Wheelchair - manual;Grab bars - tub/shower;Shower seat;Wheelchair - power      Prior Function Prior Level of Function : Needs assist             Mobility Comments: Mod I transfers. power w/c bound x 3-4 months. Prior to that ambulated short distances with rollator. ADLs Comments: PCA MWF for 3 hours, primarily to assist with bathing/shower     Hand Dominance   Dominant Hand: Right    Extremity/Trunk Assessment   Upper Extremity Assessment Upper Extremity Assessment: Defer to OT evaluation    Lower Extremity Assessment Lower Extremity Assessment: Generalized weakness (grossly 3/5 bilat, difficult to discern symmetry due to overall weakness)    Cervical / Trunk Assessment Cervical / Trunk Assessment: Kyphotic  Communication   Communication: Expressive difficulties  Cognition Arousal/Alertness: Awake/alert Behavior During Therapy: WFL for tasks assessed/performed   Area of Impairment: Problem solving, Awareness                           Awareness: Emergent Problem Solving: Difficulty sequencing, Requires verbal cues General Comments: Difficult to fully assess due to expressive aphasia        General Comments      Exercises     Assessment/Plan    PT Assessment Patient needs continued PT services  PT Problem List Decreased strength;Decreased mobility;Decreased activity tolerance;Decreased balance;Pain       PT Treatment Interventions Therapeutic activities;Therapeutic exercise;Gait training;Patient/family education;Balance training;Functional mobility training    PT  Goals (Current goals can be found in the Care Plan section)  Acute Rehab PT Goals Patient Stated Goal: home PT Goal Formulation: With patient Time For Goal Achievement: 06/09/21 Potential to Achieve Goals: Good    Frequency Min 4X/week     Co-evaluation               AM-PAC PT "6 Clicks" Mobility  Outcome Measure Help needed turning from your back to your side while in a flat bed without using bedrails?: A Lot Help needed moving from lying on your back to sitting on the side of a flat bed without using bedrails?: A Lot Help needed moving to and from a bed to a chair (including a wheelchair)?: Total Help needed standing up from a chair using your arms (e.g., wheelchair or bedside chair)?: Total Help needed to walk in hospital room?: Total Help needed climbing 3-5 steps with a railing? : Total 6 Click Score: 8    End of Session Equipment Utilized During Treatment: Gait belt Activity Tolerance: Patient limited by pain Patient left: in bed;with call bell/phone within reach;with family/visitor present;with bed alarm set Nurse Communication: Mobility status PT Visit Diagnosis: Other abnormalities of gait and mobility (R26.89);Difficulty in walking, not elsewhere classified (R26.2);Muscle weakness (generalized) (M62.81);Pain    Time: 4562-5638 PT Time Calculation (min) (ACUTE ONLY): 30 min   Charges:   PT Evaluation $PT Eval Moderate Complexity: 1 Mod  Lorrin Goodell, PT  Office # [phone] Pager [phone]   Lorriane Shire 05/26/2021, 12:14 PM

## 2021-05-26 NOTE — TOC CAGE-AID Note (Signed)
Transition of Care [facility]) - CAGE-AID Screening   Patient Details  Name: [name] [name] MRN: [id number] Date of Birth: January 28, 1926  Transition of Care [facility]) CM/SW Contact:    Lus Kriegel C Tarpley-Carter, [place] Phone Number: 05/26/2021, 2:58 PM   Clinical Narrative: Pt participated in [place].  Pt stated he does not use substance or ETOH.  Pt was not offered resources, due to no usage of substance or ETOH.    Kade Rickels Tarpley-Carter, MSW, LCSW-A Pronouns:  She/Her/Hers [facility] HealthTransitions of Care Clinical Social Worker Direct Number:  [phone] [email or web address]  CAGE-AID Screening: Substance Abuse Screening unable to be completed due to: : Patient unable to participate  Have You Ever Felt You Ought to Cut Down on Your Drinking or Drug Use?: No Have People Annoyed You By [employer] Your Drinking Or Drug Use?: No Have You Felt Bad Or Guilty About Your Drinking Or Drug Use?: No Have You Ever Had a Drink or Used Drugs First Thing In The Morning to Steady Your Nerves or to Get Rid of a Hangover?: No CAGE-AID Score: 0  Substance Abuse Education Offered: No

## 2021-05-26 NOTE — Progress Notes (Signed)
Inpatient Rehab Admissions Coordinator Note:   Per PT recommendations patient was screened for CIR candidacy by Michel Santee, PT. At this time, pt appears to be a potential candidate for CIR. I will place an order for rehab consult for full assessment, per our protocol.  Please contact me any with questions.Shann Medal, PT, DPT [phone] 05/26/21 12:58 PM

## 2021-05-26 NOTE — TOC Initial Note (Signed)
Transition of Care [facility]) - Initial/Assessment Note    Patient Details  Name: [name] [name] MRN: [id number] Date of Birth: [birth year]  Transition of Care [facility]) CM/SW Contact:    Ninfa Meeker, RN Phone Number: 05/26/2021, 10:06 AM  Clinical Narrative:                 Transition of Care Screening Note:  Transition of Care [facility]) Department has reviewed patient and no TOC needs have been identified at this time. We will continue to monitor patient advancement through Interdisciplinary progressions and if new patient needs arise, please place a consult.     Patient Goals and CMS Choice        Expected Discharge Plan and Services                                                Prior Living Arrangements/Services                       Activities of Daily Living Home Assistive Devices/Equipment: None ADL Screening (condition at time of admission) Patient's cognitive ability adequate to safely complete daily activities?: Yes Is the patient deaf or have difficulty hearing?: No Does the patient have difficulty seeing, even when wearing glasses/contacts?: No Does the patient have difficulty concentrating, remembering, or making decisions?: No Patient able to express need for assistance with ADLs?: Yes Does the patient have difficulty dressing or bathing?: Yes Independently performs ADLs?: Yes (appropriate for developmental age) Does the patient have difficulty walking or climbing stairs?: Yes Weakness of Legs: Both Weakness of Arms/Hands: None  Permission Sought/Granted                  Emotional Assessment              Admission diagnosis:  Acute CVA (cerebrovascular accident) ([place]) [I63.9] Cerebrovascular accident (CVA), unspecified mechanism ([place]) [I63.9] Chronic kidney disease, unspecified CKD stage [N18.9] Patient Active Problem List   Diagnosis Date Noted   Acute CVA (cerebrovascular accident) ([place]) 05/25/2021   DNR (do not resuscitate)  05/25/2021   Osteoporosis 05/05/2021   Gouty arthritis 02/13/2021   Morbid obesity ([place]) 02/13/2021   Abdominal aortic aneurysm without rupture ([place]) 11/22/2020   Acute renal failure syndrome ([place]) 11/22/2020   Low back pain 11/22/2020   Skin tear of right upper arm without complication 56/31/4970   Pressure injury of skin 10/23/2020   Urinary tract infection with hematuria 10/22/2020   Encephalopathy 10/22/2020   AKI (acute kidney injury) ([place])    Overweight (BMI 25.0-29.9) 08/19/2020   Vertebral fracture, osteoporotic ([place]) 08/18/2020   T8 vertebral fracture ([place]) 08/17/2020   Thrombocytopenia ([place]) 08/17/2020   CKD (chronic kidney disease) stage 3, GFR 30-59 ml/min ([place]) 08/17/2020   Elevated troponin 08/17/2020   Pain due to onychomycosis of toenails of both feet 07/27/2020   Callus of heel 07/27/2020   Bifascicular block 11/11/2019   Acute on chronic diastolic heart failure ([place]) 11/10/2019   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   Severe aortic stenosis 09/29/2019   Aortic valve disorder 26/37/8588   Systolic heart failure ([place]) 03/11/2019   Thrombophilia ([place]) 02/26/2019   Muscle pain 02/11/2019   Weakness 02/06/2019   Polymyalgia rheumatica ([place]) 02/06/2019   Shoulder joint pain 02/02/2019   Anemia 01/26/2019   Malignant melanoma of skin ([place]) 01/26/2019  Pain in right leg 01/26/2019   DDD (degenerative disc disease), cervical 05/28/2018   Encounter for general adult medical examination without abnormal findings 11/19/2017   Carotid artery occlusion 07/03/2017   Hearing loss 07/03/2017   Fall 06/19/2017   Injury of upper extremity 06/19/2017   Neck pain 06/19/2017   Headache 06/19/2017   Visual disturbance 06/19/2017   Ganglion of hand 05/08/2017   Dilatation of aorta ([place]) 02/14/2017   Plantar fascial fibromatosis 02/14/2017   Cardiac murmur, unspecified 02/27/2016   Benign neoplasm of colon 08/24/2015   Dementia ([place]) 08/24/2015    Influenza due to unidentified influenza virus with other respiratory manifestations 03/15/2015   Epistaxis 12/01/2014   OSA (obstructive sleep apnea) 11/18/2014   Gastro-esophageal reflux disease with esophagitis 10/29/2014   Angina pectoris ([place]) 05/04/2014   Abnormal feces 07/31/2013   Other specified disorders of gingiva and edentulous alveolar ridge 07/31/2013   Spontaneous ecchymosis 10/10/2012   Thoracic ascending aortic aneurysm ([place]) 09/29/2012   Microscopic hematuria 04/11/2012   Proteinuria 04/11/2012   S/P laparoscopic cholecystectomy 04/09/2012   S/P CABG x 3 03/06/2012   S/P Maze operation for atrial fibrillation 03/06/2012   Paroxysmal atrial fibrillation ([place]) 02/29/2012   Bitten or stung by nonvenomous insect and other nonvenomous arthropods, initial encounter 12/03/2011   Depression screening 10/17/2011   CAD (coronary artery disease), native coronary artery    Hyperlipidemia    Hypertension    Chronic venous insufficiency    Gout    Benign prostatic hyperplasia    Post-traumatic stress disorder 03/13/2011   Pain in left leg 12/08/2010   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   Fatigue 08/08/2010   Retention of urine 04/18/2010   Constipation 07/05/2009   Kidney stone 07/05/2009   ED (erectile dysfunction) of organic origin 12/14/2008   Impaired fasting glucose 12/14/2008   Vitamin B deficiency 12/14/2008   PCP:  [name], MD Pharmacy:   [facility] [place], [address] [place] [address] Phone: [phone] Fax: [phone]     Social Determinants of Health (SDOH) Interventions    Readmission Risk Interventions     View : No data to display.

## 2021-05-27 DIAGNOSIS — N189 Chronic kidney disease, unspecified: Secondary | ICD-10-CM | POA: Diagnosis not present

## 2021-05-27 DIAGNOSIS — N1831 Chronic kidney disease, stage 3a: Secondary | ICD-10-CM | POA: Diagnosis not present

## 2021-05-27 DIAGNOSIS — I639 Cerebral infarction, unspecified: Secondary | ICD-10-CM | POA: Diagnosis not present

## 2021-05-27 DIAGNOSIS — E785 Hyperlipidemia, unspecified: Secondary | ICD-10-CM | POA: Diagnosis not present

## 2021-05-27 LAB — TROPONIN I (HIGH SENSITIVITY)
Troponin I (High Sensitivity): 32 ng/L — ABNORMAL HIGH (ref <18)
Troponin I (High Sensitivity): 29 ng/L — ABNORMAL HIGH (ref <18)

## 2021-05-27 LAB — CREATININE, SERUM
Creatinine, Ser: 1.59 mg/dL — ABNORMAL HIGH (ref 0.61–1.24)
GFR, Estimated: 40 mL/min — ABNORMAL LOW (ref >60)

## 2021-05-27 MED ORDER — NITROGLYCERIN 0.4 MG SL SUBL
0.4000 mg | SUBLINGUAL_TABLET | SUBLINGUAL | Status: DC | PRN
Start: 2021-05-27 — End: 2021-05-31
  Administered 2021-05-27: 0.4 mg via SUBLINGUAL

## 2021-05-27 MED ORDER — TORSEMIDE 20 MG PO TABS
40.0000 mg | ORAL_TABLET | Freq: Every day | ORAL | Status: DC
  Administered 2021-05-27 – 2021-05-31 (×5): 40 mg via ORAL
  Filled 2021-05-27 (×5): qty 2

## 2021-05-27 MED ORDER — APIXABAN 2.5 MG PO TABS
2.5000 mg | ORAL_TABLET | Freq: Two times a day (BID) | ORAL | Status: DC
  Administered 2021-05-27 – 2021-05-31 (×9): 2.5 mg via ORAL
  Filled 2021-05-27 (×9): qty 1

## 2021-05-27 NOTE — Progress Notes (Signed)
Troponin add on to morning labs collected and delivered to lab.

## 2021-05-27 NOTE — Progress Notes (Signed)
Patient reports chest pain. EKG ordered and complete at this time. EKG results show Atrial fibrillation with right bundle branch block. MD notified, MD came to bedside and assessed patient. See new orders for further detail. VSS.

## 2021-05-27 NOTE — Plan of Care (Signed)
  Problem: Education: Goal: Knowledge of disease or condition will improve Outcome: Progressing Goal: Knowledge of secondary prevention will improve (SELECT ALL) Outcome: Progressing Goal: Knowledge of patient specific risk factors will improve (INDIVIDUALIZE FOR PATIENT) Outcome: Progressing   Problem: Education: Goal: Knowledge of General Education information will improve Description: Including pain rating scale, medication(s)/side effects and non-pharmacologic comfort measures Outcome: Progressing   Problem: Health Behavior/Discharge Planning: Goal: Ability to manage health-related needs will improve Outcome: Progressing   Problem: Clinical Measurements: Goal: Ability to maintain clinical measurements within normal limits will improve Outcome: Progressing Goal: Will remain free from infection Outcome: Progressing Goal: Diagnostic test results will improve Outcome: Progressing Goal: Respiratory complications will improve Outcome: Progressing Goal: Cardiovascular complication will be avoided Outcome: Progressing   Problem: Activity: Goal: Risk for activity intolerance will decrease Outcome: Progressing   Problem: Nutrition: Goal: Adequate nutrition will be maintained Outcome: Progressing   Problem: Coping: Goal: Level of anxiety will decrease Outcome: Progressing   Problem: Elimination: Goal: Will not experience complications related to bowel motility Outcome: Progressing Goal: Will not experience complications related to urinary retention Outcome: Progressing   Problem: Pain Managment: Goal: General experience of comfort will improve Outcome: Progressing   Problem: Safety: Goal: Ability to remain free from injury will improve Outcome: Progressing   Problem: Skin Integrity: Goal: Risk for impaired skin integrity will decrease Outcome: Progressing

## 2021-05-27 NOTE — Progress Notes (Signed)
Inpatient Rehab Admissions Coordinator:    I met with pt. And wife regarding potential CIR admit. They  state interest and can provide 24/7 assist at d/c. I will open a case with pt.'s insurance and pursue for CIR admit.   Clemens Catholic, [place], [place] Admissions Coordinator  [phone] ([place]) [phone] (office)

## 2021-05-27 NOTE — Progress Notes (Signed)
PROGRESS NOTE        PATIENT DETAILS Name: [name] [name] Age: 88 y.o. Sex: male Date of Birth: 02-07-1926 Admit Date: 05/25/2021 Admitting Physician Karmen Bongo, MD GHW:[id number], Elta Guadeloupe, MD  Brief Summary: Patient is a 88 year old male with with history of CAD s/p CABG (2014), paroxysmal atrial fibrillation s/p maze procedure-on Xarelto, AAS-s/p TAVR (2021), HFpEF HTN, HLD, PMR, OSA on CPAP, PAD s/p lymphedema-presented with dysarthria, right sided weakness-patient was found to have acute CVA and subsequently admitted to the hospitalist service.  See below for further details.   Significant events: 5/25>> dysarthria/right-sided weakness-CVA on MRI-admit to [facility].  Significant studies: 5/25>> CT head: Small left MCA territory cortical infarct 5/25>> CTA head: No intracranial LVO-multifocal severe stenosis within the right vertebral artery, moderate stenosis proximal V4 left vertebral artery, moderate stenosis paraclinoid right ICA 5/25>> CTA neck: No LVO-no hemodynamically significant stenosis. 5/25>>MRI Brain: Acute infarction right occipital lobe, left frontal lobe, left operculum 5/25>> A1c 5.8 5/25>> LDL: 122 5/26>> Echo-65-70%  Significant microbiology data:   Procedures: None  Consults: Neurology  Subjective: Some dysarthria persists.  Had some chest pain earlier this morning-relieved by nitroglycerin  Objective: Vitals: Blood pressure 137/67, pulse 72, temperature 97.8 F (36.6 C), temperature source Oral, resp. rate 18, height 6' (1.829 m), weight 100.6 kg, SpO2 99 %.   Exam: Gen Exam:Alert awake-not in any distress HEENT:atraumatic, normocephalic Chest: B/L clear to auscultation anteriorly CVS:S1S2 regular Abdomen:soft non tender, non distended Extremities:no edema Neurology: Non focal Skin: no rash   Pertinent Labs/Radiology:    Latest Ref Rng & Units 05/26/2021    6:05 AM 05/25/2021   11:27 AM 05/25/2021   11:24 AM  CBC  WBC 4.0  - 10.5 K/uL 4.3    5.6    Hemoglobin 13.0 - 17.0 g/dL 8.3   10.5   9.9    Hematocrit 39.0 - 52.0 % 26.5   31.0   31.1    Platelets 150 - 400 K/uL 108    123      Lab Results  Component Value Date   NA 140 05/26/2021   K 3.5 05/26/2021   CL 105 05/26/2021   CO2 26 05/26/2021      Assessment/Plan: Acute embolic CVA: Continues to have some amount of dysarthria-work-up as above-concern for Xarelto failure-and has been switched to Eliquis.  Recommendations from PT/OT for CIR.    PAF: Maintaining sinus rhythm-continue amiodarone-see above regarding anticoagulation.  HLD: LDL not at goal-now on Lipitor 40 mg daily-also on Zetia.    HTN: BP relatively stable-continue Imdur  CAD s/p CABG 2014: Had chest pain earlier this morning-relieved by nitroglycerin.  EKG stable-checking troponins.  History of severe AS-s/p TAVR procedure 2021: Echo showing stable prosthetic aortic valve.  Chronic HFpEF: Euvolemic-continue Demadex.  PAD s/p balloon angioplasty of posterior tibial artery October 2022: Vascular surgery in the outpatient setting  History of RA/PMR: On prednisone and leflunomide.  Normocytic anemia: Attributed to CKD/chronic disease-being followed by Dr. Alen Blew in the outpatient setting  CKD stage IIIb: Creatinine close to baseline-follow periodically.  UTI: Diagnosed prior to this hospitalization-on Omnicef.  BPH: Stable-continue finasteride  Dementia: Appears to be mild-continue Namenda and galantamine  Bilateral lower extremity wounds: Being followed by wound care in the outpatient setting  Obesity: Estimated body mass index is 30.08 kg/m as calculated from the following:   Height as of  this encounter: 6' (1.829 m).   Weight as of this encounter: 100.6 kg.   Code status:   Code Status: DNR   DVT Prophylaxis:apixaban (ELIQUIS) tablet 2.5 mg Start: 05/27/21 1000 Place and maintain sequential compression device Start: 05/26/21 1453 apixaban (ELIQUIS) tablet 2.5 mg     Family Communication: Spouse-Susan-[phone]-updated on the phone on 5/27   Disposition Plan: Status is: Inpatient Remains inpatient appropriate because: Awaiting CIR versus SNF placement.   Planned Discharge Destination: CIR versus SNF placement.   Diet: Diet Order             DIET DYS 3 Room service appropriate? Yes; Fluid consistency: Thin  Diet effective now                     Antimicrobial agents: Anti-infectives (From admission, onward)    Start     Dose/Rate Route Frequency Ordered Stop   05/25/21 2200  cefdinir (OMNICEF) capsule 300 mg       Note to Pharmacy: Started on 05-23-21 7 days   300 mg Oral 2 times daily 05/25/21 1522 05/30/21 2359        MEDICATIONS: Scheduled Meds:  amiodarone  200 mg Oral Daily   apixaban  2.5 mg Oral BID   aspirin  81 mg Oral Daily   atorvastatin  40 mg Oral Daily   cefdinir  300 mg Oral BID   ezetimibe  10 mg Oral Daily   feeding supplement  237 mL Oral BID BM   finasteride  5 mg Oral Daily   gabapentin  200 mg Oral BID   And   gabapentin  100 mg Oral QPM   galantamine  8 mg Oral Q breakfast   isosorbide mononitrate  60 mg Oral QPC supper   leflunomide  10 mg Oral Daily   memantine  10 mg Oral BID   multivitamin with minerals  1 tablet Oral Daily   pantoprazole  40 mg Oral QODAY   predniSONE  5 mg Oral Q breakfast   Continuous Infusions: PRN Meds:.acetaminophen **OR** acetaminophen (TYLENOL) oral liquid 160 mg/5 mL **OR** acetaminophen, hydrALAZINE, nitroGLYCERIN, senna-docusate   I have personally reviewed following labs and imaging studies  LABORATORY DATA: CBC: Recent Labs  Lab 05/25/21 1124 05/25/21 1127 05/26/21 0605  WBC 5.6  --  4.3  NEUTROABS 3.6  --   --   HGB 9.9* 10.5* 8.3*  HCT 31.1* 31.0* 26.5*  MCV 94.2  --  91.4  PLT 123*  --  108*     Basic Metabolic Panel: Recent Labs  Lab 05/25/21 1124 05/25/21 1127 05/26/21 0605 05/27/21 0815  NA 140 138 140  --   K 3.5 3.4* 3.5  --    CL 104 102 105  --   CO2 27  --  26  --   GLUCOSE 115* 111* 103*  --   BUN 26* 30* 23  --   CREATININE 1.71* 1.70* 1.62* 1.59*  CALCIUM 10.0  --  9.3  --      GFR: Estimated Creatinine Clearance: 36.4 mL/min (A) (by C-G formula based on SCr of 1.59 mg/dL (H)).  Liver Function Tests: Recent Labs  Lab 05/25/21 1124  AST 27  ALT 20  ALKPHOS 94  BILITOT 0.6  PROT 6.8  ALBUMIN 3.3*    No results for input(s): LIPASE, AMYLASE in the last 168 hours. No results for input(s): AMMONIA in the last 168 hours.  Coagulation Profile: Recent Labs  Lab 05/25/21 1124  INR 1.6*     Cardiac Enzymes: No results for input(s): CKTOTAL, CKMB, CKMBINDEX, TROPONINI in the last 168 hours.  BNP (last 3 results) No results for input(s): PROBNP in the last 8760 hours.  Lipid Profile: Recent Labs    05/25/21 1124  CHOL 224*  HDL 73  LDLCALC 122*  TRIG 146  CHOLHDL 3.1     Thyroid Function Tests: No results for input(s): TSH, T4TOTAL, FREET4, T3FREE, THYROIDAB in the last 72 hours.  Anemia Panel: No results for input(s): VITAMINB12, FOLATE, FERRITIN, TIBC, IRON, RETICCTPCT in the last 72 hours.  Urine analysis:    Component Value Date/Time   COLORURINE YELLOW 03/14/2021 0011   APPEARANCEUR HAZY (A) 03/14/2021 0011   LABSPEC 1.014 03/14/2021 0011   PHURINE 6.0 03/14/2021 0011   GLUCOSEU NEGATIVE 03/14/2021 0011   HGBUR SMALL (A) 03/14/2021 0011   BILIRUBINUR NEGATIVE 03/14/2021 0011   KETONESUR NEGATIVE 03/14/2021 0011   PROTEINUR NEGATIVE 03/14/2021 0011   UROBILINOGEN 1.0 03/22/2012 0845   NITRITE NEGATIVE 03/14/2021 0011   LEUKOCYTESUR MODERATE (A) 03/14/2021 0011    Sepsis Labs: Lactic Acid, Venous    Component Value Date/Time   LATICACIDVEN 1.3 10/22/2020 1417    MICROBIOLOGY: No results found for this or any previous visit (from the past 240 hour(s)).  RADIOLOGY STUDIES/RESULTS: MR BRAIN WO CONTRAST  Result Date: 05/25/2021 CLINICAL DATA:  Acute neuro  deficit.  Stroke. EXAM: MRI HEAD WITHOUT CONTRAST TECHNIQUE: Multiplanar, multiecho pulse sequences of the brain and surrounding structures were obtained without intravenous contrast. COMPARISON:  CT angio head and neck 05/25/2021 FINDINGS: Brain: Small acute infarct right occipital cortex. Small acute infarct in the left frontal cortex over the convexity. Small to moderate cortical infarct left operculum. Generalized atrophy. Mild chronic microvascular ischemic changes. Small chronic infarcts in the cerebellum bilaterally. Negative for mass. Chronic hemorrhage in the right frontal lobe. Vascular: Normal arterial flow voids. Skull and upper cervical spine: No focal skeletal lesion. Sinuses/Orbits: Paranasal sinuses clear. Bilateral cataract extraction Other: None IMPRESSION: Small areas of acute infarction in the right occipital lobe and left frontal lobe over the convexity. Mild to moderate acute infarct left operculum. Possible cerebral emboli. Atrophy and mild chronic microvascular ischemia Electronically Signed   By: Franchot Gallo M.D.   On: 05/25/2021 15:12   ECHOCARDIOGRAM COMPLETE  Result Date: 05/26/2021    ECHOCARDIOGRAM REPORT   Patient Name:   Kayston Shelbie Ammons Date of Exam: 05/26/2021 Medical Rec #:  [id number]     Height:       72.0 in Accession #:    [id number]    Weight:       221.8 lb Date of Birth:  [birth year]      BSA:          2.226 m Patient Age:    66 years      BP:           113/50 mmHg Patient Gender: M             HR:           63 bpm. Exam Location:  Inpatient Procedure: 2D Echo, Cardiac Doppler and Color Doppler Indications:    Stroke  History:        Patient has prior history of Echocardiogram examinations, most                 recent 12/27/2020. Risk Factors:Hypertension and Sleep Apnea.  Aortic Valve: 26 mm Sapien prosthetic, stented (TAVR) valve is                 present in the aortic position. Procedure Date: 11/10/19.  Sonographer:    Jefferey Pica Referring Phys: [place]  Sonographer Comments: Image acquisition challenging due to respiratory motion. IMPRESSIONS  1. Left ventricular ejection fraction, by estimation, is 65 to 70%. The left ventricle has normal function. The left ventricle has no regional wall motion abnormalities. There is severe asymmetric left ventricular hypertrophy of the septal segment. Left  ventricular diastolic parameters are indeterminate.  2. Right ventricular systolic function is normal. The right ventricular size is normal. There is mildly elevated pulmonary artery systolic pressure. The estimated right ventricular systolic pressure is 24.4 mmHg.  3. The mitral valve is degenerative. Trivial mitral valve regurgitation. No evidence of mitral stenosis.  4. The aortic valve has been repaired/replaced. Aortic valve regurgitation is mild and paravalvular. There is a 26 mm Sapien prosthetic (TAVR) valve present in the aortic position. Procedure Date: 11/10/19. Mean systolic gradient 10 mmHg.  5. Aortic dilatation noted. There is mild dilatation of the ascending aorta, measuring 42 mm.  6. The inferior vena cava is normal in size with greater than 50% respiratory variability, suggesting right atrial pressure of 3 mmHg. Conclusion(s)/Recommendation(s): No intracardiac source of embolism detected on this transthoracic study. Consider a transesophageal echocardiogram to exclude cardiac source of embolism if clinically indicated. FINDINGS  Left Ventricle: Left ventricular ejection fraction, by estimation, is 65 to 70%. The left ventricle has normal function. The left ventricle has no regional wall motion abnormalities. The left ventricular internal cavity size was normal in size. There is  severe asymmetric left ventricular hypertrophy of the septal segment. Left ventricular diastolic parameters are indeterminate. Right Ventricle: The right ventricular size is normal. No increase in right ventricular wall thickness. Right ventricular systolic function  is normal. There is mildly elevated pulmonary artery systolic pressure. The tricuspid regurgitant velocity is 3.02  m/s, and with an assumed right atrial pressure of 3 mmHg, the estimated right ventricular systolic pressure is 01.0 mmHg. Left Atrium: Left atrial size was normal in size. Right Atrium: Right atrial size was normal in size. Pericardium: Trivial pericardial effusion is present. Presence of epicardial fat layer. Mitral Valve: The mitral valve is degenerative in appearance. Mild to moderate mitral annular calcification. Trivial mitral valve regurgitation. No evidence of mitral valve stenosis. Tricuspid Valve: The tricuspid valve is normal in structure. Tricuspid valve regurgitation is mild . No evidence of tricuspid stenosis. Aortic Valve: The aortic valve has been repaired/replaced. Aortic valve regurgitation is mild. No aortic stenosis is present. Aortic valve mean gradient measures 9.9 mmHg. Aortic valve peak gradient measures 16.3 mmHg. Aortic valve area, by VTI measures 2.43 cm. There is a 26 mm Sapien prosthetic, stented (TAVR) valve present in the aortic position. Procedure Date: 11/10/19. Pulmonic Valve: The pulmonic valve was normal in structure. Pulmonic valve regurgitation is trivial. No evidence of pulmonic stenosis. Aorta: Aortic dilatation noted and the aortic root was not well visualized. There is mild dilatation of the ascending aorta, measuring 42 mm. Venous: The inferior vena cava is normal in size with greater than 50% respiratory variability, suggesting right atrial pressure of 3 mmHg. IAS/Shunts: No atrial level shunt detected by color flow Doppler.  LEFT VENTRICLE PLAX 2D LVIDd:         5.20 cm   Diastology LVIDs:         3.40 cm  LV e' lateral:   5.37 cm/s LV PW:         1.30 cm   LV E/e' lateral: 22.0 LV IVS:        1.60 cm LVOT diam:     2.20 cm LV SV:         120 LV SV Index:   54 LVOT Area:     3.80 cm  IVC IVC diam: 2.10 cm LEFT ATRIUM           Index LA diam:      3.90 cm  1.75 cm/m LA Vol (A2C): 49.8 ml 22.37 ml/m LA Vol (A4C): 62.9 ml 28.25 ml/m  AORTIC VALVE                     PULMONIC VALVE AV Area (Vmax):    2.56 cm      PV Vmax:       0.75 m/s AV Area (Vmean):   2.00 cm      PV Peak grad:  2.3 mmHg AV Area (VTI):     2.43 cm AV Vmax:           202.10 cm/s AV Vmean:          169.689 cm/s AV VTI:            0.495 m AV Peak Grad:      16.3 mmHg AV Mean Grad:      9.9 mmHg LVOT Vmax:         136.00 cm/s LVOT Vmean:        89.400 cm/s LVOT VTI:          0.316 m LVOT/AV VTI ratio: 0.64  AORTA Ao Root diam: 3.70 cm Ao Asc diam:  4.15 cm MITRAL VALVE                TRICUSPID VALVE MV Area (PHT): 3.51 cm     TR Peak grad:   36.5 mmHg MV Decel Time: 216 msec     TR Vmax:        302.00 cm/s MV E velocity: 118.00 cm/s MV A velocity: 25.10 cm/s   SHUNTS MV E/A ratio:  4.70         Systemic VTI:  0.32 m                             Systemic Diam: 2.20 cm Cherlynn Kaiser MD Electronically signed by Cherlynn Kaiser MD Signature Date/Time: 05/26/2021/11:59:05 AM    Final    CT HEAD CODE STROKE WO CONTRAST  Addendum Date: 05/25/2021   ADDENDUM REPORT: 05/25/2021 12:40 ADDENDUM: Impression #1 called by telephone at the time of interpretation on 05/25/2021 at 11:50 am to provider Dr. Theda Sers, Who verbally acknowledged these results. Electronically Signed   By: Kellie Simmering D.O.   On: 05/25/2021 12:40   Result Date: 05/25/2021 CLINICAL DATA:  Code stroke. Provided history: Neuro deficit, acute, stroke suspected. EXAM: CT HEAD WITHOUT CONTRAST TECHNIQUE: Contiguous axial images were obtained from the base of the skull through the vertex without intravenous contrast. RADIATION DOSE REDUCTION: This exam was performed according to the departmental dose-optimization program which includes automated exposure control, adjustment of the mA and/or kV according to patient size and/or use of iterative reconstruction technique. COMPARISON:  Prior head CT examinations 08/17/2020 and earlier. FINDINGS:  Brain: Mild-to-moderate generalized cerebral atrophy. Small left MCA territory cortical infarct within the mid-to-posterior left frontal lobe, new  from the prior head CT of 08/17/2020 and likely acute (series 2, images 8-10). Mild ill-defined hypoattenuation within the cerebral white matter, nonspecific but compatible with chronic small vessel ischemic disease. Redemonstrated small chronic infarct within the left cerebellar hemisphere. There is no acute intracranial hemorrhage. No extra-axial fluid collection. No evidence of an intracranial mass. No midline shift. Vascular: No hyperdense vessel. Atherosclerotic calcifications. Skull: No fracture or aggressive osseous lesion. Sinuses/Orbits: No mass or acute finding within the imaged orbits. Mild mucosal thickening and small mucous retention cysts within the bilateral ethmoid air cells. ASPECTS [facility] Stroke Program Early CT Score) - Ganglionic level infarction (caudate, lentiform nuclei, internal capsule, insula, M1-M3 cortex): 7 - Supraganglionic infarction (M4-M6 cortex): 2 Total score (0-10 with 10 being normal): 9 Attempts are being made to reach the ordering provider at this time. IMPRESSION: Small left MCA territory cortical infarct within the mid-to-posterior left frontal lobe, new from the prior head CT of 08/17/2020 and likely acute. ASPECTS is 9. Mild chronic small vessel ischemic changes within the cerebral white matter. Redemonstrated small chronic infarct within the left cerebellar hemisphere. Mild-to-moderate generalized cerebral atrophy. Bilateral ethmoid sinusitis. Electronically Signed: By: Kellie Simmering D.O. On: 05/25/2021 11:43   CT ANGIO HEAD NECK W WO CM (CODE STROKE)  Result Date: 05/25/2021 CLINICAL DATA:  Provided history: Neuro deficit, acute, stroke suspected; acute right-sided weakness with expressive aphasia. EXAM: CT ANGIOGRAPHY HEAD AND NECK TECHNIQUE: Multidetector CT imaging of the head and neck was performed using the standard  protocol during bolus administration of intravenous contrast. Multiplanar CT image reconstructions and MIPs were obtained to evaluate the vascular anatomy. Carotid stenosis measurements (when applicable) are obtained utilizing NASCET criteria, using the distal internal carotid diameter as the denominator. RADIATION DOSE REDUCTION: This exam was performed according to the departmental dose-optimization program which includes automated exposure control, adjustment of the mA and/or kV according to patient size and/or use of iterative reconstruction technique. CONTRAST:  19m OMNIPAQUE IOHEXOL 350 MG/ML SOLN COMPARISON:  Noncontrast head CT performed immediately prior 05/25/2021. FINDINGS: CTA NECK FINDINGS Aortic arch: Standard aortic branching. Atherosclerotic plaque within the visualized aortic arch and proximal major branch vessels of the neck. No hemodynamically significant innominate or proximal subclavian artery stenosis. Right carotid system: CCA and ICA patent within the neck. Soft and calcified atherosclerotic plaque about the carotid bifurcation and within the proximal ICA, resulting in less than 50% stenosis at the ICA origin. Left carotid system: CCA and ICA patent within the neck. Predominantly calcified plaque within the CCA, bout the carotid bifurcation and within the proximal ICA without measurable stenosis. Vertebral arteries: Vertebral arteries patent within the neck. The left vertebral artery is dominant. Nonstenotic calcified plaque at the origin of the left vertebral artery. Skeleton: Straightening of the expected cervical lordosis. 2 mm C4-C5 grade 1 anterolisthesis. Cervical spondylosis. Vertebral ankylosis at C2-C3 and C5-C6. No acute fracture or aggressive osseous lesion. Other neck: No neck mass or cervical lymphadenopathy. Upper chest: Prior median sternotomy. Incompletely imaged bilateral pleural effusions. Interlobular septal thickening and ground-glass opacity within the imaged lung  apices, likely reflecting edema. Review of the MIP images confirms the above findings CTA HEAD FINDINGS Anterior circulation: The intracranial internal carotid arteries are patent. Calcified plaque within both vessels. Moderate stenosis of the right paraclinoid segment. Otherwise, no more than mild stenosis of the intracranial ICAs. The M1 middle cerebral arteries are patent. No M2 proximal branch occlusion or high-grade proximal stenosis. The anterior cerebral arteries are patent. Posterior circulation: Severe atherosclerotic irregularity with  severe multifocal stenoses in the right vertebral artery at the level of skull base and intracranially. Calcified plaque within the proximal V4 left vertebral artery with moderate stenosis. The basilar artery is patent. The posterior cerebral arteries are patent. The left PCA is fetal in origin. The right posterior communicating artery is diminutive or absent. Venous sinuses: Within the limitations of contrast timing, no convincing thrombus. Anatomic variants: As described. Review of the MIP images confirms the above findings No emergent large vessel occlusion. This finding and CTA head impression #3 called by telephone at the time of interpretation on 05/25/2021 at 11:50 am to provider [facility] , who verbally acknowledged these results. IMPRESSION: CTA neck: 1. The common carotid and internal carotid arteries are patent within the neck. Atherosclerotic plaque bilaterally, without hemodynamically significant stenosis. 2. Vertebral arteries patent within the neck. Non-stenotic calcified plaque at the origin of the left vertebral artery. 3. Pulmonary edema within the imaged lung apices. 4. Incompletely imaged bilateral pleural effusions. 5. Cervical spondylosis. C2-C3 and C5-C6 vertebral ankylosis. CTA head: 1. No intracranial large vessel occlusion is identified. 2. Intracranial atherosclerotic disease with multifocal stenoses, most notably as follows. 3. Multifocal  severe stenoses within the right vertebral artery at the level of the skull base and intracranially. 4. Moderate stenosis within the proximal V4 left vertebral artery. 5. Moderate stenosis of the paraclinoid right ICA. Electronically Signed   By: Kellie Simmering D.O.   On: 05/25/2021 12:15     LOS: 2 days   Oren Binet, MD  [facility]    To contact the attending provider between 7A-7P or the covering provider during after hours 7P-7A, please log into the web site [email or web address] and access using universal [place] password for that web site. If you do not have the password, please call the hospital operator.  05/27/2021, 10:36 AM

## 2021-05-27 NOTE — Progress Notes (Signed)
PT wearing home cpap. No resp distress noted.  

## 2021-05-27 NOTE — Progress Notes (Signed)
Physical Therapy Treatment Patient Details Name: [name] [name] MRN: [id number] DOB: October 31, 1926 Today's Date: 05/27/2021   History of Present Illness Pt is a 88 y.o. male who presented to the ED 5/25 with c/o R side weakness and difficulty speaking. MRI revealed small areas of acute infarction in the right occipital lobe and left frontal lobe over the convexity; mild to moderate acute infarct left operculum; possible cerebral emboli. PMH:  AAA; CAD s/p CABG; chronic systolic CHF; afib s/p Maze, on Xarelto; HTN; HLD; PMR (currently on steroids); pre-diabetes; OSA on CPAP; PAD with lymphedema; spinal compression fx; and AS s/p TAVR    PT Comments    Patient with improved pain management this session. Continues to be limited by weakness and decreased activity tolerance. Patient require heavy minA+2 to stand from elevated surface and take 2-3 side steps towards [facility] with modA+2. Improved dysarthria with audible English accent. Patient seems to demo slow processing and decreased awareness into deficits at times. Continue to recommend acute inpatient rehab (AIR) for post-acute therapy needs.     Recommendations for follow up therapy are one component of a multi-disciplinary discharge planning process, led by the attending physician.  Recommendations may be updated based on patient status, additional functional criteria and insurance authorization.  Follow Up Recommendations  Acute inpatient rehab (3hours/day)     Assistance Recommended at Discharge PRN  Patient can return home with the following Assistance with cooking/housework;A lot of help with bathing/dressing/bathroom;Two people to help with walking and/or transfers;Assist for transportation   Equipment Recommendations  None recommended by PT    Recommendations for Other Services       Precautions / Restrictions Precautions Precautions: Fall     Mobility  Bed Mobility Overal bed mobility: Needs Assistance Bed Mobility: Rolling,  Sidelying to Sit, Sit to Sidelying Rolling: Min assist Sidelying to sit: Mod assist     Sit to sidelying: Mod assist General bed mobility comments: cues for log roll technique, increased time, assist for LEs over EOB, to raise trunk and for LEs back into bed, pt able to scoot hips to EOB without assist    Transfers Overall transfer level: Needs assistance Equipment used: Rolling [name] (2 wheels) Transfers: Sit to/from Stand Sit to Stand: +2 physical assistance, Min assist           General transfer comment: heavy +2 min assist to stand from elevated bed allowing pt to pull up on [name], pt able to take side steps along EOB to reposition with +2 mod assist and RW    Ambulation/Gait                   Stairs             Wheelchair Mobility    Modified Rankin (Stroke Patients Only)       Balance Overall balance assessment: Needs assistance Sitting-balance support: Feet supported, Single extremity supported Sitting balance-Leahy Scale: Poor Sitting balance - Comments: one LOB posterior, pt requires at least one hand on bed to offload back pain, but prefers 2   Standing balance support: Bilateral upper extremity supported, Reliant on assistive device for balance Standing balance-Leahy Scale: Poor Standing balance comment: +2 min assist with posterior lean and pt stabilizing LEs on bed                            Cognition Arousal/Alertness: Awake/alert Behavior During Therapy: WFL for tasks assessed/performed Overall Cognitive Status: Impaired/Different from baseline  Area of Impairment: Following commands, Memory, Problem solving                     Memory: Decreased short-term memory Following Commands: Follows one step commands with increased time     Problem Solving: Difficulty sequencing, Requires verbal cues General Comments: pt did not recall OT and PT eval yesterday, cues needed to use his L hand for oral care when R was  ineffective        Exercises      General Comments        Pertinent Vitals/Pain Pain Assessment Pain Assessment: 0-10 Pain Score: 6  Pain Location: back Pain Descriptors / Indicators: Grimacing, Discomfort, Moaning Pain Intervention(s): Repositioned, Premedicated before session, Monitored during session    Home Living                          Prior Function            PT Goals (current goals can now be found in the care plan section) Acute Rehab PT Goals PT Goal Formulation: With patient Time For Goal Achievement: 06/09/21 Potential to Achieve Goals: Good Progress towards PT goals: Progressing toward goals    Frequency    Min 4X/week      PT Plan Current plan remains appropriate    Co-evaluation PT/OT/SLP Co-Evaluation/Treatment: Yes Reason for Co-Treatment: For patient/therapist safety;To address functional/ADL transfers PT goals addressed during session: Mobility/safety with mobility;Balance        AM-PAC PT "6 Clicks" Mobility   Outcome Measure  Help needed turning from your back to your side while in a flat bed without using bedrails?: A Lot Help needed moving from lying on your back to sitting on the side of a flat bed without using bedrails?: A Lot Help needed moving to and from a bed to a chair (including a wheelchair)?: Total Help needed standing up from a chair using your arms (e.g., wheelchair or bedside chair)?: Total Help needed to walk in hospital room?: Total Help needed climbing 3-5 steps with a railing? : Total 6 Click Score: 8    End of Session   Activity Tolerance: Patient limited by pain Patient left: in bed;with call bell/phone within reach;with family/visitor present;with bed alarm set Nurse Communication: Mobility status PT Visit Diagnosis: Other abnormalities of gait and mobility (R26.89);Difficulty in walking, not elsewhere classified (R26.2);Muscle weakness (generalized) (M62.81);Pain     Time: 4650-3546 PT  Time Calculation (min) (ACUTE ONLY): 33 min  Charges:  $Therapeutic Activity: 8-22 mins                     Daphne Karrer A. Gilford Rile PT, DPT Acute Rehabilitation Services Pager [phone] Office [phone]    Linna Hoff 05/27/2021, 4:45 PM

## 2021-05-27 NOTE — Progress Notes (Addendum)
ANTICOAGULATION CONSULT NOTE - Initial Consult  Pharmacy Consult for transition from Xarelto to Eliquis (potential Xarelto failure) Indication: atrial fibrillation  Allergies  Allergen Reactions   Amoxicillin Other (See Comments)    Headache, debilitated   Bee Venom Anaphylaxis   Avelox [Moxifloxacin] Other (See Comments)    Unknown   Codeine Other (See Comments)   Duloxetine Hcl Other (See Comments)    felt weird   Levitra [Vardenafil] Other (See Comments)    Unknown   Morphine And Related Other (See Comments)    hypotension   Penicillin G Sodium Other (See Comments)    Severe headache, fatigue "debilitated"   Testosterone Other (See Comments)    unknown   Tizanidine Other (See Comments)    unknown   Viagra [Sildenafil] Other (See Comments)    didn't like it    Patient Measurements: Height: 6' (182.9 cm) Weight: 100.6 kg (221 lb 12.5 oz) IBW/kg (Calculated) : 77.6  Vital Signs: Temp: 97.8 F (36.6 C) (05/27 0302) Temp Source: Axillary (05/27 0302) BP: 140/58 (05/27 0302) Pulse Rate: 54 (05/27 0302)  Labs: Recent Labs    05/25/21 1124 05/25/21 1127 05/26/21 0605  HGB 9.9* 10.5* 8.3*  HCT 31.1* 31.0* 26.5*  PLT 123*  --  108*  APTT 40*  --   --   LABPROT 18.8*  --   --   INR 1.6*  --   --   CREATININE 1.71* 1.70* 1.62*    Estimated Creatinine Clearance: 35.7 mL/min (A) (by C-G formula based on SCr of 1.62 mg/dL (H)).   Medical History: Past Medical History:  Diagnosis Date   AAA (abdominal aortic aneurysm) (HCC)    Arthritis    Ascending aorta dilatation ([place]) 03/13/2012   Fusiform dilatation of the ascending thoracic aorta discovered during surgery, max diameter 4.2-4.3 cm   BPH (benign prostatic hypertrophy)    Chronic systolic heart failure ([place]) 03/03/2012   Chronic venous insufficiency    GERD (gastroesophageal reflux disease)    Glaucoma    Gout    History of kidney stones    Hyperlipidemia    Hypertension    Obesity (BMI 30-39.9)     Paroxysmal atrial fibrillation (HCC) 02/29/2012   Recurrent paroxysmal, new-onset    PMR (polymyalgia rheumatica) (HCC)    Polymyalgia rheumatica ([place]) 02/06/2019   Pre-diabetes    S/P CABG x 3 03/06/2012   LIMA to LAD, SVG to OM, SVG to RCA, EVH via right thigh   S/P Maze operation for atrial fibrillation 03/06/2012   Complete bilateral lesions set using bipolar radiofrequency and cryothermy ablation with clipping of LA appendage   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   s/p TAVR with a 26 mm Edwards via the left subclavian by Drs Buena Irish and Bartle   Severe aortic stenosis 09/29/2019   Sleep apnea    USES CPAP NIGHTLY    Medications:  Medications Prior to Admission  Medication Sig Dispense Refill Last Dose   acetaminophen (TYLENOL) 500 MG tablet Take 2 tablets (1,000 mg total) by mouth 3 (three) times daily. For 3 to 5 days and then change to 1000 mg 3 times daily as needed for pain. (Patient taking differently: Take 500 mg by mouth as needed for mild pain.)   05/25/2021   amiodarone (PACERONE) 200 MG tablet Take 1 tablet (200 mg total) by mouth daily. 90 tablet 3 05/25/2021   Ascorbic Acid (VITAMIN C) 500 MG CAPS Take 500 mg by mouth daily.   05/25/2021  cadexomer iodine (IODOSORB) 0.9 % gel Apply 1 application. topically every other day as needed for wound care.   unk   cefdinir (OMNICEF) 300 MG capsule Take 300 mg by mouth 2 (two) times daily. Started on 05-23-21 7 DS   05/24/2021   cholecalciferol (VITAMIN D3) 25 MCG (1000 UNIT) tablet Take 1,000 Units by mouth daily.    05/25/2021   Cyanocobalamin (VITAMIN B-12) 5000 MCG TBDP Take 5,000 mcg by mouth daily.   05/25/2021   denosumab (PROLIA) 60 MG/ML SOSY injection Inject 60 mg into the skin every 6 (six) months.   10/17/2020   docusate sodium (COLACE) 100 MG capsule Take 1 capsule (100 mg total) by mouth 2 (two) times daily. (Patient taking differently: Take 100 mg by mouth every evening.)   05/24/2021    EPINEPHrine 0.3 mg/0.3 mL IJ SOAJ injection Inject 0.3 mLs (0.3 mg total) into the muscle as needed for anaphylaxis. (Patient taking differently: Inject 0.3 mg into the muscle once as needed for anaphylaxis.) 2 each 2 unk   ezetimibe (ZETIA) 10 MG tablet Take 10 mg by mouth daily.   05/25/2021   finasteride (PROSCAR) 5 MG tablet Take 5 mg by mouth daily.   05/25/2021   fish oil-omega-3 fatty acids 1000 MG capsule Take 1 g by mouth daily.    05/25/2021   gabapentin (NEURONTIN) 100 MG capsule Take 100-200 mg by mouth See admin instructions. '200mg'$  in the morning, '100mg'$  in the evening, and '200mg'$  at bedtime   05/25/2021   galantamine (RAZADYNE ER) 8 MG 24 hr capsule Take 8 mg by mouth Daily.   05/25/2021   hydrALAZINE (APRESOLINE) 25 MG tablet Take 25 mg by mouth 2 (two) times daily.   05/25/2021   HYDROcodone-acetaminophen (NORCO/VICODIN) 5-325 MG tablet Take 1 tablet by mouth every 6 (six) hours as needed for moderate pain.   unk   isosorbide mononitrate (IMDUR) 60 MG 24 hr tablet TAKE 1 TABLET(60 MG) BY MOUTH DAILY AFTER SUPPER (Patient taking differently: 60 mg daily.) 90 tablet 1 05/24/2021   leflunomide (ARAVA) 20 MG tablet Take 10 mg by mouth daily.   05/25/2021   Magnesium 250 MG TABS Take 250 mg by mouth daily.   05/25/2021   memantine (NAMENDA) 10 MG tablet Take 10 mg by mouth 2 (two) times daily.   05/25/2021   metoprolol succinate (TOPROL-XL) 25 MG 24 hr tablet Take 0.5 tablets (12.5 mg total) by mouth daily as needed (for high heart rate, consistently greater than 110/min.). (Patient taking differently: Take 12.5 mg by mouth daily.) 90 tablet 1 05/25/2021 at 0800   naftifine (NAFTIN) 1 % cream Apply 1 application. topically daily as needed (fungal skin infection).   unk   nitroGLYCERIN (NITROSTAT) 0.4 MG SL tablet Place 1 tablet (0.4 mg total) under the tongue every 5 (five) minutes x 3 doses as needed for chest pain. 30 tablet 3 Past Month   pantoprazole (PROTONIX) 40 MG tablet Take 40 mg by mouth every  other day.   4 05/25/2021   potassium chloride (MICRO-K) 10 MEQ CR capsule TAKE ONE CAPSULE BY MOUTH EVERY DAY WITH LASIX (Patient taking differently: Take 10 mEq by mouth daily.) 90 capsule 1 05/25/2021   predniSONE (DELTASONE) 5 MG tablet Take 5 mg by mouth daily. Continuous   05/25/2021   Psyllium Husk POWD Take 1 Scoop by mouth daily.    05/25/2021   Rivaroxaban (XARELTO) 15 MG TABS tablet Take 15 mg by mouth daily with supper.   05/24/2021 at  1700   torsemide (DEMADEX) 20 MG tablet Take 2 tablets (40 mg total) by mouth daily. 180 tablet 1 05/25/2021    Assessment: 11 yoM with history of paroxysmal atrial fibrillation s/p maze procedure-on chronic Xarelto. Patient presented with acute CVA while on Xarelto, potential Xarelto failure. Age 27 yo, SCr 1.6 (unsure if this is baseline), weight 100.6 kg.  Of note: Prior to admission, patient has been anemic/thrombocytopenic recently with transfusion x 2 units twice.  He saw oncology at [facility] and his iron was good but they having been giving Epogen (only 1 of 3 received so far, 2nd was due 5/26). He also saw urology shortly before admission and was placed on Cefdinir for UTI (w/gross hematuria), had 3 doses PTA. He also sees wound care every other week for wounds. Patient has not been taking his Lipitor, only zetia and fish oil. LDL is 122, goal <70, has been restarted on Lipitor.  Last dose of Xarelto was 5/26 @ 1649   Goal of Therapy:  Therapeutic anticoagulation Monitor platelets by anticoagulation protocol: Yes   Plan:  Discontinue Xarelto Start apixaban 2.5 mg PO BID Monitor for bleeding Monitor SCr for dose adjustments   Thank you for allowing [place] to participate in this patients care. Jens Som, PharmD 05/27/2021 7:46 AM  **Pharmacist phone directory can be found on [email or web address] listed under [place]**

## 2021-05-27 NOTE — Progress Notes (Signed)
Chaplain provided emotional and spiritual support to pt and spouse following referral from pt's clergy. Also, provided orientation and hospitality to spouse, [name].  Daughter and granddaughter arrived as wife prepared to leave.    Please contact as needed for ongoing support.    Minus Liberty, [place]    05/27/21 2000  Clinical Encounter Type  Visited With Patient and family together  Visit Type Initial;Follow-up  Referral From Patient's clergy  Consult/Referral To Chaplain  Spiritual Encounters  Spiritual Needs Prayer  Stress Factors  Patient Stress Factors Loss of control;Health changes  Family Stress Factors Loss of control;Family relationships

## 2021-05-27 NOTE — Progress Notes (Signed)
Occupational Therapy Treatment Patient Details Name: [name] [name] MRN: [id number] DOB: June 24, 1926 Today's Date: 05/27/2021   History of present illness Pt is a 88 y.o. male who presented to the ED 5/25 with c/o R side weakness and difficulty speaking. MRI revealed small areas of acute infarction in the right occipital lobe and left frontal lobe over the convexity; mild to moderate acute infarct left operculum; possible cerebral emboli. PMH:  AAA; CAD s/p CABG; chronic systolic CHF; afib s/p Maze, on Xarelto; HTN; HLD; PMR (currently on steroids); pre-diabetes; OSA on CPAP; PAD with lymphedema; spinal compression fx; and AS s/p TAVR   OT comments  Pt with less back pain than prior session, but continues to limit activity tolerance. Improved R UE strength (4+/5) all areas, but appears to have impaired sensation when using R UE during functional activity. Pt able to stand and take several side steps toward [facility] with +2 mod assist, great effort and flexed posture. Pt less dysarthric with audible English accent. Continues to be appropriate for inpatient rehab.   Recommendations for follow up therapy are one component of a multi-disciplinary discharge planning process, led by the attending physician.  Recommendations may be updated based on patient status, additional functional criteria and insurance authorization.    Follow Up Recommendations  Acute inpatient rehab (3hours/day)    Assistance Recommended at Discharge Frequent or constant Supervision/Assistance  Patient can return home with the following  Two people to help with walking and/or transfers;Two people to help with bathing/dressing/bathroom;Assistance with cooking/housework;Direct supervision/assist for medications management;Direct supervision/assist for financial management;Assist for transportation;Help with stairs or ramp for entrance   Equipment Recommendations  None recommended by OT    Recommendations for Other Services       Precautions / Restrictions Precautions Precautions: Fall       Mobility Bed Mobility Overal bed mobility: Needs Assistance Bed Mobility: Rolling, Sidelying to Sit, Sit to Sidelying Rolling: Min assist Sidelying to sit: Mod assist     Sit to sidelying: Mod assist General bed mobility comments: cues for log roll technique, increased time, assist for LEs over EOB, to raise trunk and for LEs back into bed, pt able to scoot hips to EOB without assist    Transfers Overall transfer level: Needs assistance Equipment used: Rolling walker (2 wheels) Transfers: Sit to/from Stand Sit to Stand: +2 physical assistance, Min assist           General transfer comment: heavy +2 min assist to stand from elevated bed allowing pt to pull up on walker, pt able to take side steps along EOB to reposition with +2 mod assist and RW     Balance Overall balance assessment: Needs assistance Sitting-balance support: Feet supported, Single extremity supported Sitting balance-Leahy Scale: Poor Sitting balance - Comments: one LOB posterior, pt requires at least one hand on bed to offload back pain, but prefers 2   Standing balance support: Bilateral upper extremity supported, Reliant on assistive device for balance Standing balance-Leahy Scale: Poor Standing balance comment: +2 min assist with posterior lean and pt stabilizing LEs on bed                           ADL either performed or assessed with clinical judgement   ADL Overall ADL's : Needs assistance/impaired     Grooming: Oral care;Sitting;Minimal assistance Grooming Details (indicate cue type and reason): Pt without awareness when toothbrush turned in R hand and became ineffective, switched to L hand  with verbal cues                     Toileting- Clothing Manipulation and Hygiene: +2 for physical assistance;Total assistance;Sit to/from stand Toileting - Clothing Manipulation Details (indicate cue type and reason): for  posterior pericare       General ADL Comments: Pt less reliant on B UE support in sitting, able to complete grooming at EOB with one hand support    Extremity/Trunk Assessment Upper Extremity Assessment RUE Deficits / Details: 4+/5 RUE Coordination: decreased fine motor            Vision       Perception     Praxis      Cognition Arousal/Alertness: Awake/alert Behavior During Therapy: WFL for tasks assessed/performed Overall Cognitive Status: Impaired/Different from baseline Area of Impairment: Following commands, Memory, Problem solving                     Memory: Decreased short-term memory Following Commands: Follows one step commands with increased time     Problem Solving: Difficulty sequencing, Requires verbal cues General Comments: pt did not recall OT and PT eval yesterday, cues needed to use his L hand for oral care when R was ineffective        Exercises      Shoulder Instructions       General Comments      Pertinent Vitals/ Pain       Pain Assessment Pain Assessment: 0-10 Pain Score: 6  Pain Location: back Pain Descriptors / Indicators: Grimacing, Discomfort, Moaning Pain Intervention(s): Repositioned, Premedicated before session, Monitored during session  Home Living                                          Prior Functioning/Environment              Frequency  Min 3X/week        Progress Toward Goals  OT Goals(current goals can now be found in the care plan section)  Progress towards OT goals: Progressing toward goals  Acute Rehab OT Goals OT Goal Formulation: With patient Time For Goal Achievement: 06/09/21 Potential to Achieve Goals: Good  Plan Discharge plan remains appropriate    Co-evaluation    PT/OT/SLP Co-Evaluation/Treatment: Yes Reason for Co-Treatment: For patient/therapist safety          AM-PAC OT "6 Clicks" Daily Activity     Outcome Measure   Help from another person  eating meals?: A Little Help from another person taking care of personal grooming?: A Little Help from another person toileting, which includes using toliet, bedpan, or urinal?: Total Help from another person bathing (including washing, rinsing, drying)?: A Lot Help from another person to put on and taking off regular upper body clothing?: A Lot Help from another person to put on and taking off regular lower body clothing?: Total 6 Click Score: 12    End of Session Equipment Utilized During Treatment: Gait belt;Rolling walker (2 wheels)  OT Visit Diagnosis: Unsteadiness on feet (R26.81);Other abnormalities of gait and mobility (R26.89);Pain;Muscle weakness (generalized) (M62.81);Hemiplegia and hemiparesis;Other symptoms and signs involving cognitive function Hemiplegia - Right/Left: Right Hemiplegia - dominant/non-dominant: Dominant Hemiplegia - caused by: Cerebral infarction   Activity Tolerance Patient limited by pain   Patient Left in bed;with call bell/phone within reach;with bed alarm set;with family/visitor present   Nurse  Communication          Time: 0881-1031 OT Time Calculation (min): 33 min  Charges: OT General Charges $OT Visit: 1 Visit OT Treatments $Therapeutic Activity: 8-22 mins  Nestor Lewandowsky, OTR/L Acute Rehabilitation Services Pager: [phone] Office: [phone]   Malka So 05/27/2021, 3:40 PM

## 2021-05-28 DIAGNOSIS — I639 Cerebral infarction, unspecified: Secondary | ICD-10-CM | POA: Diagnosis not present

## 2021-05-28 LAB — CREATININE, SERUM
Creatinine, Ser: 1.52 mg/dL — ABNORMAL HIGH (ref 0.61–1.24)
GFR, Estimated: 43 mL/min — ABNORMAL LOW (ref >60)

## 2021-05-28 LAB — CBC
HCT: 28.2 % — ABNORMAL LOW (ref 39.0–52.0)
Hemoglobin: 8.6 g/dL — ABNORMAL LOW (ref 13.0–17.0)
MCH: 28.3 pg (ref 26.0–34.0)
MCHC: 30.5 g/dL (ref 30.0–36.0)
MCV: 92.8 fL (ref 80.0–100.0)
Platelets: 126 10*3/uL — ABNORMAL LOW (ref 150–400)
RBC: 3.04 MIL/uL — ABNORMAL LOW (ref 4.22–5.81)
RDW: 17.5 % — ABNORMAL HIGH (ref 11.5–15.5)
WBC: 4.6 10*3/uL (ref 4.0–10.5)
nRBC: 0 % (ref 0.0–0.2)

## 2021-05-28 NOTE — Progress Notes (Signed)
PROGRESS NOTE        PATIENT DETAILS Name: [name] [name] Age: 88 y.o. Sex: male Date of Birth: 05-25-1926 Admit Date: 05/25/2021 Admitting Physician Karmen Bongo, MD AYT:[id number], Elta [place], MD  Brief Summary: Patient is a 88 year old male with with history of CAD s/p CABG (2014), paroxysmal atrial fibrillation s/p maze procedure-on Xarelto, AAS-s/p TAVR (2021), HFpEF HTN, HLD, PMR, OSA on CPAP, PAD s/p lymphedema-presented with dysarthria, right sided weakness-patient was found to have acute CVA and subsequently admitted to the hospitalist service.  See below for further details.   Significant events: 5/25>> dysarthria/right-sided weakness-CVA on MRI-admit to [facility].  Significant studies: 5/25>> CT head: Small left MCA territory cortical infarct 5/25>> CTA head: No intracranial LVO-multifocal severe stenosis within the right vertebral artery, moderate stenosis proximal V4 left vertebral artery, moderate stenosis paraclinoid right ICA 5/25>> CTA neck: No LVO-no hemodynamically significant stenosis. 5/25>>MRI Brain: Acute infarction right occipital lobe, left frontal lobe, left operculum 5/25>> A1c 5.8 5/25>> LDL: 122 5/26>> Echo-65-70%  Significant microbiology data:   Procedures: None  Consults: Neurology  Subjective: Dysarthria seems to be slightly better.  Objective: Vitals: Blood pressure 139/64, pulse 69, temperature 97.9 F (36.6 C), temperature source Oral, resp. rate 15, height 6' (1.829 m), weight 100.6 kg, SpO2 98 %.   Exam: Gen Exam:Alert awake-not in any distress HEENT:atraumatic, normocephalic Chest: B/L clear to auscultation anteriorly CVS:S1S2 regular Abdomen:soft non tender, non distended Extremities:no edema Neurology: Non focal Skin: no rash   Pertinent Labs/Radiology:    Latest Ref Rng & Units 05/28/2021   12:34 AM 05/26/2021    6:05 AM 05/25/2021   11:27 AM  CBC  WBC 4.0 - 10.5 K/uL 4.6   4.3     Hemoglobin 13.0 - 17.0  g/dL 8.6   8.3   10.5    Hematocrit 39.0 - 52.0 % 28.2   26.5   31.0    Platelets 150 - 400 K/uL 126   108       Lab Results  Component Value Date   NA 140 05/26/2021   K 3.5 05/26/2021   CL 105 05/26/2021   CO2 26 05/26/2021      Assessment/Plan: Acute embolic CVA: Continues to have some amount of dysarthria-work-up as above-concern for Xarelto failure-and has been switched to Eliquis.  Recommendations from PT/OT for CIR.    PAF: Maintaining sinus rhythm-continue amiodarone-see above regarding anticoagulation.  HLD: LDL not at goal-now on Lipitor 40 mg daily-also on Zetia.    HTN: BP relatively stable-continue Imdur  CAD s/p CABG 2014: Had chest pain earlier this morning-relieved by nitroglycerin.  EKG stable-checking troponins.  History of severe AS-s/p TAVR procedure 2021: Echo showing stable prosthetic aortic valve.  Chronic HFpEF: Euvolemic-continue Demadex.  PAD s/p balloon angioplasty of posterior tibial artery October 2022: Vascular surgery in the outpatient setting  History of RA/PMR: On prednisone and leflunomide.  Normocytic anemia: Attributed to CKD/chronic disease-being followed by Dr. Alen Blew in the outpatient setting  CKD stage IIIb: Creatinine close to baseline-follow periodically.  UTI: Diagnosed prior to this hospitalization-on Omnicef.  BPH: Stable-continue finasteride  Dementia: Appears to be mild-continue Namenda and galantamine  Bilateral lower extremity wounds: Being followed by wound care in the outpatient setting  Obesity: Estimated body mass index is 30.08 kg/m as calculated from the following:   Height as of this encounter: 6' (1.829 m).  Weight as of this encounter: 100.6 kg.   Code status:   Code Status: DNR   DVT Prophylaxis:apixaban (ELIQUIS) tablet 2.5 mg Start: 05/27/21 1000 Place and maintain sequential compression device Start: 05/26/21 1453 apixaban (ELIQUIS) tablet 2.5 mg    Family Communication:  Spouse-Susan-[phone]-updated on the phone on 5/27   Disposition Plan: Status is: Inpatient Remains inpatient appropriate because: Awaiting insurance authorization for CIR.   Planned Discharge Destination: CIR    Diet: Diet Order             DIET DYS 3 Room service appropriate? Yes; Fluid consistency: Thin  Diet effective now                     Antimicrobial agents: Anti-infectives (From admission, onward)    Start     Dose/Rate Route Frequency Ordered Stop   05/25/21 2200  cefdinir (OMNICEF) capsule 300 mg       Note to Pharmacy: Started on 05-23-21 7 days   300 mg Oral 2 times daily 05/25/21 1522 05/30/21 2359        MEDICATIONS: Scheduled Meds:  amiodarone  200 mg Oral Daily   apixaban  2.5 mg Oral BID   aspirin  81 mg Oral Daily   atorvastatin  40 mg Oral Daily   cefdinir  300 mg Oral BID   ezetimibe  10 mg Oral Daily   feeding supplement  237 mL Oral BID BM   finasteride  5 mg Oral Daily   gabapentin  200 mg Oral BID   And   gabapentin  100 mg Oral QPM   galantamine  8 mg Oral Q breakfast   isosorbide mononitrate  60 mg Oral QPC supper   leflunomide  10 mg Oral Daily   memantine  10 mg Oral BID   multivitamin with minerals  1 tablet Oral Daily   pantoprazole  40 mg Oral QODAY   predniSONE  5 mg Oral Q breakfast   torsemide  40 mg Oral Daily   Continuous Infusions: PRN Meds:.acetaminophen **OR** acetaminophen (TYLENOL) oral liquid 160 mg/5 mL **OR** acetaminophen, hydrALAZINE, nitroGLYCERIN, senna-docusate   I have personally reviewed following labs and imaging studies  LABORATORY DATA: CBC: Recent Labs  Lab 05/25/21 1124 05/25/21 1127 05/26/21 0605 05/28/21 0034  WBC 5.6  --  4.3 4.6  NEUTROABS 3.6  --   --   --   HGB 9.9* 10.5* 8.3* 8.6*  HCT 31.1* 31.0* 26.5* 28.2*  MCV 94.2  --  91.4 92.8  PLT 123*  --  108* 126*     Basic Metabolic Panel: Recent Labs  Lab 05/25/21 1124 05/25/21 1127 05/26/21 0605 05/27/21 0815  05/28/21 0034  NA 140 138 140  --   --   K 3.5 3.4* 3.5  --   --   CL 104 102 105  --   --   CO2 27  --  26  --   --   GLUCOSE 115* 111* 103*  --   --   BUN 26* 30* 23  --   --   CREATININE 1.71* 1.70* 1.62* 1.59* 1.52*  CALCIUM 10.0  --  9.3  --   --      GFR: Estimated Creatinine Clearance: 38.1 mL/min (A) (by C-G formula based on SCr of 1.52 mg/dL (H)).  Liver Function Tests: Recent Labs  Lab 05/25/21 1124  AST 27  ALT 20  ALKPHOS 94  BILITOT 0.6  PROT 6.8  ALBUMIN 3.3*  No results for input(s): LIPASE, AMYLASE in the last 168 hours. No results for input(s): AMMONIA in the last 168 hours.  Coagulation Profile: Recent Labs  Lab 05/25/21 1124  INR 1.6*     Cardiac Enzymes: No results for input(s): CKTOTAL, CKMB, CKMBINDEX, TROPONINI in the last 168 hours.  BNP (last 3 results) No results for input(s): PROBNP in the last 8760 hours.  Lipid Profile: Recent Labs    05/25/21 1124  CHOL 224*  HDL 73  LDLCALC 122*  TRIG 146  CHOLHDL 3.1     Thyroid Function Tests: No results for input(s): TSH, T4TOTAL, FREET4, T3FREE, THYROIDAB in the last 72 hours.  Anemia Panel: No results for input(s): VITAMINB12, FOLATE, FERRITIN, TIBC, IRON, RETICCTPCT in the last 72 hours.  Urine analysis:    Component Value Date/Time   COLORURINE YELLOW 03/14/2021 0011   APPEARANCEUR HAZY (A) 03/14/2021 0011   LABSPEC 1.014 03/14/2021 0011   PHURINE 6.0 03/14/2021 0011   GLUCOSEU NEGATIVE 03/14/2021 0011   HGBUR SMALL (A) 03/14/2021 0011   BILIRUBINUR NEGATIVE 03/14/2021 0011   KETONESUR NEGATIVE 03/14/2021 0011   PROTEINUR NEGATIVE 03/14/2021 0011   UROBILINOGEN 1.0 03/22/2012 0845   NITRITE NEGATIVE 03/14/2021 0011   LEUKOCYTESUR MODERATE (A) 03/14/2021 0011    Sepsis Labs: Lactic Acid, Venous    Component Value Date/Time   LATICACIDVEN 1.3 10/22/2020 1417    MICROBIOLOGY: No results found for this or any previous visit (from the past 240  hour(s)).  RADIOLOGY STUDIES/RESULTS: No results found.   LOS: 3 days   Oren Binet, MD  [facility]    To contact the attending provider between 7A-7P or the covering provider during after hours 7P-7A, please log into the web site [email or web address] and access using universal [place] password for that web site. If you do not have the password, please call the hospital operator.  05/28/2021, 10:13 AM

## 2021-05-28 NOTE — PMR Pre-admission (Signed)
PMR Admission Coordinator Pre-Admission Assessment  Patient: [name] [name] is an 88 y.o., male MRN: [id number] DOB: July 03, 1926 Height: 6' (182.9 cm) Weight: 100.6 kg  Insurance Information HMO:   yes   PPO:      PCP:      IPA:      80/20:      OTHER:  PRIMARY: BCBS Medicare       Policy#: [id number]      Subscriber: pt CM Name: [name]       Phone#: [phone]     Fax#: [phone] Pre-Cert#:       Employer:  Benefits:  Phone #: [phone]     Name:  [name] Date: 01/01/2021 - still active Deductible: does not have one OOP Max: $3,500 ($2,048.12 met) CIR: $250/day co-pay for days 1-6 SNF: $0.00 Copayment per day for days 1-20; $196 Copayment per day for days 21-60; $0.00 copayment for days 61-100 Maximum of 100 days/benefit period Outpatient:  90% coverage, 10% co-insurance Home Health:  100% coverage DME: 80% coverage, 20% co-insurance Providers: in network   SECONDARY:       Policy#:      Phone#:   Development worker, community:       Phone#:   The Engineer, petroleum" for patients in Inpatient Rehabilitation Facilities with attached "Privacy Act [place] Records" was provided and verbally reviewed with: Patient and Family  Emergency Contact Information Contact Information     Name Relation Home Work Mobile   [place] Spouse [phone]  [phone]   [place] Spore Daughter   [phone]       Current Medical History  Patient Admitting Diagnosis: CVA  History of Present Illness: HPI: [name] [name] is a 88 year old right-handed male with history of AAA, CAD with CABG, compression fractures, chronic anemia followed by hematology services, chronic systolic congestive heart failure, A-fib/AF status post MAZE on Xarelto, hypertension, hyperlipidemia, PMR maintained on steroids, prediabetes, early dementia maintained on Namenda, chronic lower extremity wounds with Unna boot OSA with CPAP, PAD with lymphedema status post balloon angioplasty of posterior  tibial artery October 2022 recent UTI placed on antibiotic therapy.  Per chart review lives with spouse.  He does have a personal care attendant Monday Wednesday Friday for 3 hours.  Modified independent transfers power wheelchair bound x3 to 4 months prior to that ambulating short distances with a rollator.  Present 05/25/2021 with right-sided weakness and aphasia of acute onset.  Cranial CT scan showed small left MCA territory cortical infarct within the mid to posterior left frontal lobe.  CT angiogram head and neck no large vessel occlusion.  Multifocal severe stenosis within the right vertebral artery at the level of the skull base and intracranially.  MRI identified small areas of acute infarct right occipital lobe left frontal lobe over the convexity.  Mild to moderate acute infarct left operculum.  Possible cerebral emboli.  Patient did not receive tPA.  Echocardiogram ejection fraction of 65 to 70% no wall motion abnormalities.  Admission chemistries unremarkable except BUN 26 creatinine 1.71, hemoglobin 9.9, hemoglobin A1c 5.8, troponin 29-32.  Neurology follow-up currently maintained on Eliquis patient had been on Xarelto prior to admission for atrial fibrillation.  He completed a course of Omnicef for diagnosed UTI prior to hospitalization.  Tolerating regular diet.  Therapy evaluations completed due to patient's right-sided weakness and aphasia was admitted for a comprehensive rehab program.  Complete NIHSS TOTAL: 2  Patient's medical record from [facility]  has been reviewed by the rehabilitation admission coordinator  and physician.  Past Medical History  Past Medical History:  Diagnosis Date   AAA (abdominal aortic aneurysm) (HCC)    Arthritis    Ascending aorta dilatation ([place]) 03/13/2012   Fusiform dilatation of the ascending thoracic aorta discovered during surgery, max diameter 4.2-4.3 cm   BPH (benign prostatic hypertrophy)    Chronic systolic heart failure ([place])  03/03/2012   Chronic venous insufficiency    GERD (gastroesophageal reflux disease)    Glaucoma    Gout    History of kidney stones    Hyperlipidemia    Hypertension    Obesity (BMI 30-39.9)    Paroxysmal atrial fibrillation (HCC) 02/29/2012   Recurrent paroxysmal, new-onset    PMR (polymyalgia rheumatica) (HCC)    Polymyalgia rheumatica ([place]) 02/06/2019   Pre-diabetes    S/P CABG x 3 03/06/2012   LIMA to LAD, SVG to OM, SVG to RCA, EVH via right thigh   S/P Maze operation for atrial fibrillation 03/06/2012   Complete bilateral lesions set using bipolar radiofrequency and cryothermy ablation with clipping of LA appendage   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   s/p TAVR with a 26 mm Edwards via the left subclavian by Drs Buena Irish and Bartle   Severe aortic stenosis 09/29/2019   Sleep apnea    USES CPAP NIGHTLY    Has the patient had major surgery during 100 days prior to admission? Yes  Family History   family history includes Dementia in his mother; Stroke in his brother.  Current Medications  Current Facility-Administered Medications:    acetaminophen (TYLENOL) tablet 650 mg, 650 mg, Oral, Q4H PRN, 650 mg at 05/27/21 1130 **OR** acetaminophen (TYLENOL) 160 MG/5ML solution 650 mg, 650 mg, Per Tube, Q4H PRN **OR** acetaminophen (TYLENOL) suppository 650 mg, 650 mg, Rectal, Q4H PRN, Karmen Bongo, MD   amiodarone (PACERONE) tablet 200 mg, 200 mg, Oral, Daily, Karmen Bongo, MD, 200 mg at 05/28/21 0850   apixaban (ELIQUIS) tablet 2.5 mg, 2.5 mg, Oral, BID, Ghimire, Shanker M, MD, 2.5 mg at 05/28/21 8588   aspirin chewable tablet 81 mg, 81 mg, Oral, Daily, Karmen Bongo, MD, 81 mg at 05/28/21 0851   atorvastatin (LIPITOR) tablet 40 mg, 40 mg, Oral, Daily, Karmen Bongo, MD, 40 mg at 05/28/21 5027   cefdinir (OMNICEF) capsule 300 mg, 300 mg, Oral, BID, Ghimire, Shanker M, MD, 300 mg at 05/28/21 7412   ezetimibe (ZETIA) tablet 10 mg, 10 mg, Oral,  Daily, Karmen Bongo, MD, 10 mg at 05/28/21 8786   feeding supplement (ENSURE ENLIVE / ENSURE PLUS) liquid 237 mL, 237 mL, Oral, BID BM, Ghimire, Shanker M, MD, 237 mL at 05/28/21 0853   finasteride (PROSCAR) tablet 5 mg, 5 mg, Oral, Daily, Karmen Bongo, MD, 5 mg at 05/28/21 0851   gabapentin (NEURONTIN) capsule 200 mg, 200 mg, Oral, BID, 200 mg at 05/28/21 0851 **AND** gabapentin (NEURONTIN) capsule 100 mg, 100 mg, Oral, QPM, Karmen Bongo, MD, 100 mg at 05/27/21 1547   galantamine (RAZADYNE ER) 24 hr capsule 8 mg, 8 mg, Oral, Q breakfast, Karmen Bongo, MD, 8 mg at 05/28/21 7672   hydrALAZINE (APRESOLINE) injection 5 mg, 5 mg, Intravenous, Q4H PRN, Karmen Bongo, MD   isosorbide mononitrate (IMDUR) 24 hr tablet 60 mg, 60 mg, Oral, QPC supper, Karmen Bongo, MD, 60 mg at 05/27/21 1703   leflunomide (ARAVA) tablet 10 mg, 10 mg, Oral, Daily, Karmen Bongo, MD, 10 mg at 05/28/21 0850   memantine (NAMENDA) tablet 10 mg, 10 mg, Oral, BID, Lorin Mercy,  Anderson Malta, MD, 10 mg at 05/28/21 5093   multivitamin with minerals tablet 1 tablet, 1 tablet, Oral, Daily, Ghimire, Henreitta Leber, MD, 1 tablet at 05/28/21 0851   nitroGLYCERIN (NITROSTAT) SL tablet 0.4 mg, 0.4 mg, Sublingual, Q5 min PRN, Ghimire, Shanker M, MD, 0.4 mg at 05/27/21 0853   pantoprazole (PROTONIX) EC tablet 40 mg, 40 mg, Oral, Cathlean Sauer, MD, 40 mg at 05/27/21 2671   predniSONE (DELTASONE) tablet 5 mg, 5 mg, Oral, Q breakfast, Karmen Bongo, MD, 5 mg at 05/28/21 2458   senna-docusate (Senokot-S) tablet 1 tablet, 1 tablet, Oral, QHS PRN, Karmen Bongo, MD   torsemide [facility]) tablet 40 mg, 40 mg, Oral, Daily, Ghimire, Henreitta Leber, MD, 40 mg at 05/28/21 0998  Patients Current Diet:  Diet Order             DIET DYS 3 Room service appropriate? Yes; Fluid consistency: Thin  Diet effective now                   Precautions / Restrictions Precautions Precautions: Fall Restrictions Weight Bearing Restrictions: No    Has the patient had 2 or more falls or a fall with injury in the past year? Yes  Prior Activity Level Limited Community (1-2x/wk): Pt. went out mostly for appts  Prior Functional Level Self Care: Did the patient need help bathing, dressing, using the toilet or eating? Dependent  Indoor Mobility: Did the patient need assistance with walking from room to room (with or without device)? Needed some help  Stairs: Did the patient need assistance with internal or external stairs (with or without device)? Dependent  Functional Cognition: Did the patient need help planning regular tasks such as shopping or remembering to take medications? Needed some help  Patient Information Are you of Hispanic, Latino/a,or Spanish origin?: A. No, not of Hispanic, Latino/a, or Spanish origin What is your race?: A. White Do you need or want an interpreter to communicate with a doctor or health care staff?: 0. No  Patient's Response To:  Health Literacy and Transportation Is the patient able to respond to health literacy and transportation needs?: Yes Health Literacy - How often do you need to have someone help you when you read instructions, pamphlets, or other written material from your doctor or pharmacy?: Never In the past 12 months, has lack of transportation kept you from medical appointments or from getting medications?: Yes In the past 12 months, has lack of transportation kept you from meetings, work, or from getting things needed for daily living?: Yes  [place] / [place] Devices/Equipment: None Home Equipment: Rollator (4 wheels), Conservation officer, nature (2 wheels), Wheelchair - manual, Grab bars - tub/shower, Civil engineer, contracting, Wheelchair - power  Prior Device Use: Indicate devices/aids used by the patient prior to current illness, exacerbation or injury? Motorized wheelchair or scooter and Barista  Overall Cognitive Status: Impaired/Different  from baseline Difficult to assess due to: Impaired communication Orientation Level: Oriented X4 Following Commands: Follows one step commands with increased time General Comments: pt did not recall OT and PT eval yesterday, cues needed to use his L hand for oral care when R was ineffective    Extremity Assessment (includes Sensation/Coordination)  Upper Extremity Assessment: RUE deficits/detail RUE Deficits / Details: 4+/5 RUE Coordination: decreased fine motor  Lower Extremity Assessment: Defer to PT evaluation    ADLs  Overall ADL's : Needs assistance/impaired Eating/Feeding: Minimal assistance, Bed level Eating/Feeding Details (indicate cue type and  reason): cued pt to use L hand for hot drink, increased spillage with R hand Grooming: Oral care, Sitting, Minimal assistance Grooming Details (indicate cue type and reason): Pt without awareness when toothbrush turned in R hand and became ineffective, switched to L hand with verbal cues Upper Body Bathing: Total assistance, Sitting Lower Body Bathing: +2 for physical assistance, Total assistance, Sit to/from stand Upper Body Dressing : Moderate assistance, Sitting Lower Body Dressing: Total assistance, +2 for physical assistance, Sit to/from stand Toileting- Clothing Manipulation and Hygiene: +2 for physical assistance, Total assistance, Sit to/from stand Toileting - Clothing Manipulation Details (indicate cue type and reason): for posterior pericare General ADL Comments: Pt less reliant on B UE support in sitting, able to complete grooming at EOB with one hand support    Mobility  Overal bed mobility: Needs Assistance Bed Mobility: Rolling, Sidelying to Sit, Sit to Sidelying Rolling: Min assist Sidelying to sit: Mod assist Supine to sit: HOB elevated, Max assist Sit to sidelying: Mod assist General bed mobility comments: cues for log roll technique, increased time, assist for LEs over EOB, to raise trunk and for LEs back into bed,  pt able to scoot hips to EOB without assist    Transfers  Overall transfer level: Needs assistance Equipment used: Rolling walker (2 wheels) Transfers: Sit to/from Stand Sit to Stand: +2 physical assistance, Min assist General transfer comment: heavy +2 min assist to stand from elevated bed allowing pt to pull up on walker, pt able to take side steps along EOB to reposition with +2 mod assist and RW    Ambulation / Gait / Stairs / Wheelchair Mobility  Ambulation/Gait General Gait Details: unable    Posture / Balance Dynamic Sitting Balance Sitting balance - Comments: one LOB posterior, pt requires at least one hand on bed to offload back pain, but prefers 2 Balance Overall balance assessment: Needs assistance Sitting-balance support: Feet supported, Single extremity supported Sitting balance-Leahy Scale: Poor Sitting balance - Comments: one LOB posterior, pt requires at least one hand on bed to offload back pain, but prefers 2 Standing balance support: Bilateral upper extremity supported, Reliant on assistive device for balance Standing balance-Leahy Scale: Poor Standing balance comment: +2 min assist with posterior lean and pt stabilizing LEs on bed    Special needs/care consideration Skin LE woud  and Special service needs uses w/c at baseline   Previous Home Environment (from acute therapy documentation) Living Arrangements: Spouse/significant other  Lives With: Spouse Available Help at Discharge: Family, Available 24 hours/day, Personal care attendant Type of Home: House Home Layout: Able to live on main level with bedroom/bathroom Home Access: Elevator Bathroom Shower/Tub: Multimedia programmer: Handicapped height Bathroom Accessibility: Yes How Accessible: Accessible via wheelchair, Accessible via walker [place]: No  Discharge Living Setting Plans for Discharge Living Setting: Patient's home Type of Home at Discharge: House Discharge Home Layout:  Able to live on main level with bedroom/bathroom Discharge Home Access: Elevator Discharge Bathroom Shower/Tub: Walk-in shower Discharge Bathroom Toilet: Handicapped height Discharge Bathroom Accessibility: Yes How Accessible: Accessible via walker, Accessible via wheelchair Does the patient have any problems obtaining your medications?: No  Social/Family/Support Systems Patient Roles: Spouse Contact Information: [phone] Anticipated Caregiver: Rogue Jury Anticipated Caregiver's Contact Information: Pt. also has caregivers through LTC policy and can increase Ability/Limitations of Caregiver: 24/7 min-mod A Caregiver Availability: 24/7 Discharge Plan Discussed with Primary Caregiver: Yes Is Caregiver In Agreement with Plan?: Yes Does Caregiver/Family have Issues with Lodging/Transportation while Pt is in  Rehab?: Yes  Goals Patient/Family Goal for Rehab: PT/OT Min A wheelchair level; SLP Mod I Expected length of stay: 16-18 days Pt/Family Agrees to Admission and willing to participate: Yes Program Orientation Provided & Reviewed with Pt/Caregiver Including Roles  & Responsibilities: Yes  Decrease burden of Care through IP rehab admission: Specialzed equipment needs, Decrease number of caregivers, Bowel and bladder program, and Patient/family education  Possible need for SNF placement upon discharge: not anticipated  Patient Condition: I have reviewed medical records from [facility], spoken with CM, and patient and spouse. I met with patient at the bedside for inpatient rehabilitation assessment.  Patient will benefit from ongoing PT, OT, and SLP, can actively participate in 3 hours of therapy a day 5 days of the week, and can make measurable gains during the admission.  Patient will also benefit from the coordinated team approach during an Inpatient Acute Rehabilitation admission.  The patient will receive intensive therapy as well as Rehabilitation physician,  nursing, social worker, and care management interventions.  Due to bladder management, bowel management, safety, skin/wound care, disease management, medication administration, pain management, and patient education the patient requires 24 hour a day rehabilitation nursing.  The patient is currently min A with mobility and basic ADLs.  Discharge setting and therapy post discharge at home with home health is anticipated.  Patient has agreed to participate in the Acute Inpatient Rehabilitation Program and will admit today.  Preadmission Screen Completed By:  Genella Mech, 05/28/2021 11:21 AM ______________________________________________________________________   Discussed status with Dr. Curlene Dolphin on 05/31/21 at 72 and received approval for admission today.  Admission Coordinator:  Danise Edge, time 1015/Date 05/31/21   Assessment/Plan: Diagnosis:CVA  Does the need for close, 24 hr/day Medical supervision in concert with the patient's rehab needs make it unreasonable for this patient to be served in a less intensive setting? Yes Co-Morbidities requiring supervision/potential complications: AAA, CAD with CABG, compression fractures, chronic anemia Due to bladder management, bowel management, safety, skin/wound care, disease management, medication administration, pain management, and patient education, does the patient require 24 hr/day rehab nursing? Yes Does the patient require coordinated care of a physician, rehab nurse, PT, OT, and SLP to address physical and functional deficits in the context of the above medical diagnosis(es)? Yes Addressing deficits in the following areas: balance, endurance, locomotion, strength, transferring, bowel/bladder control, bathing, dressing, feeding, grooming, toileting, cognition, speech, language, swallowing, and psychosocial support Can the patient actively participate in an intensive therapy program of at least 3 hrs of therapy 5 days a week? Yes The  potential for patient to make measurable gains while on inpatient rehab is excellent Anticipated functional outcomes upon discharge from inpatient rehab: min assist PT, min assist OT, modified independent SLP Estimated rehab length of stay to reach the above functional goals is: 16-18 Anticipated discharge destination: Home 10. Overall Rehab/Functional Prognosis: excellent   MD Signature: Jennye Boroughs

## 2021-05-28 NOTE — Progress Notes (Signed)
Patient resting on home CPAP. No assistance needed from RT at this time.

## 2021-05-29 ENCOUNTER — Encounter: Payer: Self-pay | Admitting: Pulmonary Disease

## 2021-05-29 ENCOUNTER — Encounter: Payer: Self-pay | Admitting: Oncology

## 2021-05-29 ENCOUNTER — Other Ambulatory Visit (HOSPITAL_COMMUNITY): Payer: Self-pay

## 2021-05-29 DIAGNOSIS — I639 Cerebral infarction, unspecified: Secondary | ICD-10-CM | POA: Diagnosis not present

## 2021-05-29 MED ORDER — LIDOCAINE 5 % EX PTCH
1.0000 | MEDICATED_PATCH | CUTANEOUS | Status: DC
  Administered 2021-05-29: 1 via TRANSDERMAL
  Filled 2021-05-29 (×2): qty 1

## 2021-05-29 MED ORDER — TRAMADOL HCL 50 MG PO TABS
25.0000 mg | ORAL_TABLET | Freq: Four times a day (QID) | ORAL | Status: DC | PRN
  Administered 2021-05-29 – 2021-05-31 (×3): 25 mg via ORAL
  Filled 2021-05-29 (×3): qty 1

## 2021-05-29 NOTE — TOC Benefit Eligibility Note (Signed)
Patient Research scientist (life sciences) completed.     The patient is currently admitted and upon discharge could be taking XARELTO 20 MG.   The current 30 day co-pay is, $137.20.   The patient is insured through  [employer] HMO.

## 2021-05-29 NOTE — Progress Notes (Signed)
Physical Therapy Treatment Patient Details Name: [name] [name] MRN: [id number] DOB: 24-Jun-1926 Today's Date: 05/29/2021   History of Present Illness Pt is a 88 y.o. male who presented to the ED 5/25 with c/o R side weakness and difficulty speaking. MRI revealed small areas of acute infarction in the right occipital lobe and left frontal lobe over the convexity; mild to moderate acute infarct left operculum; possible cerebral emboli. PMH:  AAA; CAD s/p CABG; chronic systolic CHF; afib s/p Maze, on Xarelto; HTN; HLD; PMR (currently on steroids); pre-diabetes; OSA on CPAP; PAD with lymphedema; spinal compression fx; and AS s/p TAVR    PT Comments    Patient RLE strength improving with knee extension 4/5, knee flexion 3+, ankle DF 2+. Able to perform standing from elevated bed and elevated recliner with +2 min assist. Making good progress and very determined to get back to his baseline.     Recommendations for follow up therapy are one component of a multi-disciplinary discharge planning process, led by the attending physician.  Recommendations may be updated based on patient status, additional functional criteria and insurance authorization.  Follow Up Recommendations  Acute inpatient rehab (3hours/day)     Assistance Recommended at Discharge PRN  Patient can return home with the following Assistance with cooking/housework;A lot of help with bathing/dressing/bathroom;Two people to help with walking and/or transfers;Assist for transportation   Equipment Recommendations  None recommended by PT    Recommendations for Other Services Rehab consult     Precautions / Restrictions Precautions Precautions: Fall Restrictions Weight Bearing Restrictions: No     Mobility  Bed Mobility Overal bed mobility: Needs Assistance Bed Mobility: Rolling, Sidelying to Sit Rolling: Min assist Sidelying to sit: HOB elevated, Min assist       General bed mobility comments: cues for log roll technique,  increased time and use of rail; assist to scoot hips to EOB    Transfers Overall transfer level: Needs assistance Equipment used: Rolling walker (2 wheels) Transfers: Sit to/from Stand, Bed to chair/wheelchair/BSC Sit to Stand: +2 physical assistance, Min assist   Step pivot transfers: +2 physical assistance, Min assist       General transfer comment: +2 min assist to stand from elevated bed allowing pt to pull up on walker with one hand; +2 min to push up from recliner (seated elevated with folded blankets)    Ambulation/Gait             Pre-gait activities: pivotal steps from bed to chair with good foot clearance General Gait Details: unable   Stairs             Wheelchair Mobility    Modified Rankin (Stroke Patients Only)       Balance Overall balance assessment: Needs assistance Sitting-balance support: Feet supported, Single extremity supported Sitting balance-Leahy Scale: Poor Sitting balance - Comments: pt requires at least one hand on bed to offload back pain, but prefers 2   Standing balance support: Bilateral upper extremity supported, Reliant on assistive device for balance Standing balance-Leahy Scale: Poor Standing balance comment: +2 min assist with posterior lean and pt stabilizing LEs on bed                            Cognition Arousal/Alertness: Awake/alert Behavior During Therapy: WFL for tasks assessed/performed Overall Cognitive Status: Within Functional Limits for tasks assessed  Exercises      General Comments        Pertinent Vitals/Pain Pain Assessment Pain Assessment: Faces Faces Pain Scale: Hurts whole lot Pain Location: back Pain Descriptors / Indicators: Grimacing, Discomfort, Moaning Pain Intervention(s): Limited activity within patient's tolerance, Monitored during session, Repositioned    Home Living     Available Help at Discharge:  Family;Available 24 hours/day;Personal care attendant Type of Home: House                  Prior Function            PT Goals (current goals can now be found in the care plan section) Acute Rehab PT Goals Patient Stated Goal: home PT Goal Formulation: With patient Time For Goal Achievement: 06/09/21 Potential to Achieve Goals: Good    Frequency    Min 4X/week      PT Plan Current plan remains appropriate    Co-evaluation PT/OT/SLP Co-Evaluation/Treatment: Yes Reason for Co-Treatment: Complexity of the patient's impairments (multi-system involvement);For patient/therapist safety;To address functional/ADL transfers PT goals addressed during session: Mobility/safety with mobility;Balance;Proper use of DME;Strengthening/ROM        AM-PAC PT "6 Clicks" Mobility   Outcome Measure  Help needed turning from your back to your side while in a flat bed without using bedrails?: A Lot Help needed moving from lying on your back to sitting on the side of a flat bed without using bedrails?: A Lot Help needed moving to and from a bed to a chair (including a wheelchair)?: Total Help needed standing up from a chair using your arms (e.g., wheelchair or bedside chair)?: Total Help needed to walk in hospital room?: Total Help needed climbing 3-5 steps with a railing? : Total 6 Click Score: 8    End of Session Equipment Utilized During Treatment: Gait belt Activity Tolerance: Patient limited by pain Patient left: with call bell/phone within reach;with family/visitor present;in chair;with chair alarm set;Other (comment) (OT present) Nurse Communication: Mobility status PT Visit Diagnosis: Other abnormalities of gait and mobility (R26.89);Difficulty in walking, not elsewhere classified (R26.2);Muscle weakness (generalized) (M62.81);Pain     Time: 4270-6237 PT Time Calculation (min) (ACUTE ONLY): 22 min  Charges:  $Gait Training: 8-22 mins                      Arby Barrette,  PT Acute Rehabilitation Services  Pager [phone] Office [phone]    Rexanne Mano 05/29/2021, 12:18 PM

## 2021-05-29 NOTE — TOC Progression Note (Signed)
Transition of Care [facility]) - Progression Note    Patient Details  Name: [name] [name] MRN: [id number] Date of Birth: 17-Sep-1926  Transition of Care [facility]) CM/SW [place], RN Phone Number: 05/29/2021, 11:32 AM  Clinical Narrative:     Waiting insurance authorization for CIR. TOC will continue to follow       Expected Discharge Plan and Services                                                 Social Determinants of Health (SDOH) Interventions    Readmission Risk Interventions     View : No data to display.

## 2021-05-29 NOTE — Care Management Important Message (Signed)
Important Message  Patient Details  Name: [name] [name] MRN: [id number] Date of Birth: [birth year]   Medicare Important Message Given:  Yes     Orbie Pyo 05/29/2021, 2:31 PM

## 2021-05-29 NOTE — TOC Benefit Eligibility Note (Signed)
Patient Research scientist (life sciences) completed.     The patient is currently admitted and upon discharge could be taking ELIQUIS 2.5 MG.  The current 30 day co-pay is, $136.68.   The patient is currently admitted and upon discharge could be taking ELIQUIS 5 MG .   The current 30 day co-pay is, $136.68.   The patient is insured through [employer] HMO.

## 2021-05-29 NOTE — Progress Notes (Signed)
PROGRESS NOTE        PATIENT DETAILS Name: [name] [name] Age: 88 y.o. Sex: male Date of Birth: April 15, 1926 Admit Date: 05/25/2021 Admitting Physician Karmen Bongo, MD CHE:NIDPOE, Elta Guadeloupe, MD  Brief Summary: Patient is a 88 year old male with with history of CAD s/p CABG (2014), paroxysmal atrial fibrillation s/p maze procedure-on Xarelto, AAS-s/p TAVR (2021), HFpEF HTN, HLD, PMR, OSA on CPAP, PAD s/p lymphedema-presented with dysarthria, right sided weakness-patient was found to have acute CVA and subsequently admitted to the hospitalist service.  See below for further details.   Significant events: 5/25>> dysarthria/right-sided weakness-CVA on MRI-admit to TRH.  Significant studies: 5/25>> CT head: Small left MCA territory cortical infarct 5/25>> CTA head: No intracranial LVO-multifocal severe stenosis within the right vertebral artery, moderate stenosis proximal V4 left vertebral artery, moderate stenosis paraclinoid right ICA 5/25>> CTA neck: No LVO-no hemodynamically significant stenosis. 5/25>>MRI Brain: Acute infarction right occipital lobe, left frontal lobe, left operculum 5/25>> A1c 5.8 5/25>> LDL: 122 5/26>> Echo-65-70%  Significant microbiology data:   Procedures: None  Consults: Neurology  Subjective: Awake/alert-dysarthria seems slightly better.  No shortness of breath/chest pain.  Awaiting insurance authorization for CIR.  Objective: Vitals: Blood pressure (!) 115/57, pulse 83, temperature 97.6 F (36.4 C), temperature source Oral, resp. rate 20, height 6' (1.829 m), weight 100.6 kg, SpO2 91 %.   Exam: Gen Exam:Alert awake-not in any distress HEENT:atraumatic, normocephalic Chest: B/L clear to auscultation anteriorly CVS:S1S2 regular Abdomen:soft non tender, non distended Extremities:no edema Neurology: Non focal Skin: no rash   Pertinent Labs/Radiology:    Latest Ref Rng & Units 05/28/2021   12:34 AM 05/26/2021    6:05 AM  05/25/2021   11:27 AM  CBC  WBC 4.0 - 10.5 K/uL 4.6   4.3     Hemoglobin 13.0 - 17.0 g/dL 8.6   8.3   10.5    Hematocrit 39.0 - 52.0 % 28.2   26.5   31.0    Platelets 150 - 400 K/uL 126   108       Lab Results  Component Value Date   NA 140 05/26/2021   K 3.5 05/26/2021   CL 105 05/26/2021   CO2 26 05/26/2021      Assessment/Plan: Acute embolic CVA: Continues to have some amount of dysarthria-work-up as above-concern for Xarelto failure-and has been switched to Eliquis.  Recommendations from PT/OT for CIR.    PAF: Maintaining sinus rhythm-continue amiodarone-see above regarding anticoagulation.  HLD: LDL not at goal-now on Lipitor 40 mg daily-also on Zetia.    HTN: BP relatively stable-continue Imdur  CAD s/p CABG 2014: Stable-has had issues with chest pain a few days ago-relieved by nitroglycerin.    History of severe AS-s/p TAVR procedure 2021: Echo showing stable prosthetic aortic valve.  Chronic HFpEF: Euvolemic-continue Demadex.  PAD s/p balloon angioplasty of posterior tibial artery October 2022: Vascular surgery in the outpatient setting  History of RA/PMR: On prednisone and leflunomide.  Normocytic anemia: Attributed to CKD/chronic disease-being followed by Dr. Alen Blew in the outpatient setting  CKD stage IIIb: Creatinine close to baseline-follow periodically.  UTI: Diagnosed prior to this hospitalization-on Omnicef.  BPH: Stable-continue finasteride  Dementia: Appears to be mild-continue Namenda and galantamine  Bilateral lower extremity wounds: Being followed by wound care in the outpatient setting  Obesity: Estimated body mass index is 30.08 kg/m as calculated from the  following:   Height as of this encounter: 6' (1.829 m).   Weight as of this encounter: 100.6 kg.   Code status:   Code Status: DNR   DVT Prophylaxis:apixaban (ELIQUIS) tablet 2.5 mg Start: 05/27/21 1000 Place and maintain sequential compression device Start: 05/26/21 1453 apixaban  (ELIQUIS) tablet 2.5 mg    Family Communication: Spouse-Susan-[phone]-updated on the phone on 5/27   Disposition Plan: Status is: Inpatient Remains inpatient appropriate because: Stable for discharge to [facility] authorization for CIR.   Planned Discharge Destination: CIR    Diet: Diet Order             DIET DYS 3 Room service appropriate? Yes; Fluid consistency: Thin  Diet effective now                     Antimicrobial agents: Anti-infectives (From admission, onward)    Start     Dose/Rate Route Frequency Ordered Stop   05/25/21 2200  cefdinir (OMNICEF) capsule 300 mg       Note to Pharmacy: Started on 05-23-21 7 days   300 mg Oral 2 times daily 05/25/21 1522 05/30/21 2359        MEDICATIONS: Scheduled Meds:  amiodarone  200 mg Oral Daily   apixaban  2.5 mg Oral BID   aspirin  81 mg Oral Daily   atorvastatin  40 mg Oral Daily   cefdinir  300 mg Oral BID   ezetimibe  10 mg Oral Daily   feeding supplement  237 mL Oral BID BM   finasteride  5 mg Oral Daily   gabapentin  200 mg Oral BID   And   gabapentin  100 mg Oral QPM   galantamine  8 mg Oral Q breakfast   isosorbide mononitrate  60 mg Oral QPC supper   leflunomide  10 mg Oral Daily   lidocaine  1 patch Transdermal Q24H   memantine  10 mg Oral BID   multivitamin with minerals  1 tablet Oral Daily   pantoprazole  40 mg Oral QODAY   predniSONE  5 mg Oral Q breakfast   torsemide  40 mg Oral Daily   Continuous Infusions: PRN Meds:.acetaminophen **OR** acetaminophen (TYLENOL) oral liquid 160 mg/5 mL **OR** acetaminophen, hydrALAZINE, nitroGLYCERIN, senna-docusate, traMADol   I have personally reviewed following labs and imaging studies  LABORATORY DATA: CBC: Recent Labs  Lab 05/25/21 1124 05/25/21 1127 05/26/21 0605 05/28/21 0034  WBC 5.6  --  4.3 4.6  NEUTROABS 3.6  --   --   --   HGB 9.9* 10.5* 8.3* 8.6*  HCT 31.1* 31.0* 26.5* 28.2*  MCV 94.2  --  91.4 92.8  PLT 123*  --   108* 126*     Basic Metabolic Panel: Recent Labs  Lab 05/25/21 1124 05/25/21 1127 05/26/21 0605 05/27/21 0815 05/28/21 0034  NA 140 138 140  --   --   K 3.5 3.4* 3.5  --   --   CL 104 102 105  --   --   CO2 27  --  26  --   --   GLUCOSE 115* 111* 103*  --   --   BUN 26* 30* 23  --   --   CREATININE 1.71* 1.70* 1.62* 1.59* 1.52*  CALCIUM 10.0  --  9.3  --   --      GFR: Estimated Creatinine Clearance: 38.1 mL/min (A) (by C-G formula based on SCr of 1.52 mg/dL (H)).  Liver Function  Tests: Recent Labs  Lab 05/25/21 1124  AST 27  ALT 20  ALKPHOS 94  BILITOT 0.6  PROT 6.8  ALBUMIN 3.3*    No results for input(s): LIPASE, AMYLASE in the last 168 hours. No results for input(s): AMMONIA in the last 168 hours.  Coagulation Profile: Recent Labs  Lab 05/25/21 1124  INR 1.6*     Cardiac Enzymes: No results for input(s): CKTOTAL, CKMB, CKMBINDEX, TROPONINI in the last 168 hours.  BNP (last 3 results) No results for input(s): PROBNP in the last 8760 hours.  Lipid Profile: No results for input(s): CHOL, HDL, LDLCALC, TRIG, CHOLHDL, LDLDIRECT in the last 72 hours.   Thyroid Function Tests: No results for input(s): TSH, T4TOTAL, FREET4, T3FREE, THYROIDAB in the last 72 hours.  Anemia Panel: No results for input(s): VITAMINB12, FOLATE, FERRITIN, TIBC, IRON, RETICCTPCT in the last 72 hours.  Urine analysis:    Component Value Date/Time   COLORURINE YELLOW 03/14/2021 0011   APPEARANCEUR HAZY (A) 03/14/2021 0011   LABSPEC 1.014 03/14/2021 0011   PHURINE 6.0 03/14/2021 0011   GLUCOSEU NEGATIVE 03/14/2021 0011   HGBUR SMALL (A) 03/14/2021 0011   BILIRUBINUR NEGATIVE 03/14/2021 0011   KETONESUR NEGATIVE 03/14/2021 0011   PROTEINUR NEGATIVE 03/14/2021 0011   UROBILINOGEN 1.0 03/22/2012 0845   NITRITE NEGATIVE 03/14/2021 0011   LEUKOCYTESUR MODERATE (A) 03/14/2021 0011    Sepsis Labs: Lactic Acid, Venous    Component Value Date/Time   LATICACIDVEN 1.3  10/22/2020 1417    MICROBIOLOGY: No results found for this or any previous visit (from the past 240 hour(s)).  RADIOLOGY STUDIES/RESULTS: No results found.   LOS: 4 days   Oren Binet, MD  [facility]    To contact the attending provider between 7A-7P or the covering provider during after hours 7P-7A, please log into the web site [email or web address] and access using universal [place] password for that web site. If you do not have the password, please call the hospital operator.  05/29/2021, 9:28 AM

## 2021-05-29 NOTE — Evaluation (Signed)
Speech Language Pathology Evaluation Patient Details Name: [name] [name] MRN: [id number] DOB: 11/22/1926 Today's Date: 05/29/2021 Time: 1050-1110 SLP Time Calculation (min) (ACUTE ONLY): 20 min  Problem List:  Patient Active Problem List   Diagnosis Date Noted   Acute CVA (cerebrovascular accident) ([place]) 05/25/2021   DNR (do not resuscitate) 05/25/2021   Osteoporosis 05/05/2021   Gouty arthritis 02/13/2021   Morbid obesity ([place]) 02/13/2021   Abdominal aortic aneurysm without rupture ([place]) 11/22/2020   Acute renal failure syndrome ([place]) 11/22/2020   Low back pain 11/22/2020   Skin tear of right upper arm without complication 89/16/9450   Pressure injury of skin 10/23/2020   Urinary tract infection with hematuria 10/22/2020   Encephalopathy 10/22/2020   AKI (acute kidney injury) ([place])    Overweight (BMI 25.0-29.9) 08/19/2020   Vertebral fracture, osteoporotic ([place]) 08/18/2020   T8 vertebral fracture ([place]) 08/17/2020   Thrombocytopenia ([place]) 08/17/2020   CKD (chronic kidney disease) stage 3, GFR 30-59 ml/min (HCC) 08/17/2020   Elevated troponin 08/17/2020   Pain due to onychomycosis of toenails of both feet 07/27/2020   Callus of heel 07/27/2020   Bifascicular block 11/11/2019   Acute on chronic diastolic heart failure ([place]) 11/10/2019   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   Severe aortic stenosis 09/29/2019   Aortic valve disorder 38/88/2800   Systolic heart failure ([place]) 03/11/2019   Thrombophilia ([place]) 02/26/2019   Muscle pain 02/11/2019   Weakness 02/06/2019   Polymyalgia rheumatica ([place]) 02/06/2019   Shoulder joint pain 02/02/2019   Anemia 01/26/2019   Malignant melanoma of skin ([place]) 01/26/2019   Pain in right leg 01/26/2019   DDD (degenerative disc disease), cervical 05/28/2018   Encounter for general adult medical examination without abnormal findings 11/19/2017   Carotid artery occlusion 07/03/2017   Hearing loss 07/03/2017   Fall  06/19/2017   Injury of upper extremity 06/19/2017   Neck pain 06/19/2017   Headache 06/19/2017   Visual disturbance 06/19/2017   Ganglion of hand 05/08/2017   Dilatation of aorta ([place]) 02/14/2017   Plantar fascial fibromatosis 02/14/2017   Cardiac murmur, unspecified 02/27/2016   Benign neoplasm of colon 08/24/2015   Dementia ([place]) 08/24/2015   Influenza due to unidentified influenza virus with other respiratory manifestations 03/15/2015   Epistaxis 12/01/2014   OSA (obstructive sleep apnea) 11/18/2014   Gastro-esophageal reflux disease with esophagitis 10/29/2014   Angina pectoris ([place]) 05/04/2014   Abnormal feces 07/31/2013   Other specified disorders of gingiva and edentulous alveolar ridge 07/31/2013   Spontaneous ecchymosis 10/10/2012   Thoracic ascending aortic aneurysm ([place]) 09/29/2012   Microscopic hematuria 04/11/2012   Proteinuria 04/11/2012   S/P laparoscopic cholecystectomy 04/09/2012   S/P CABG x 3 03/06/2012   S/P Maze operation for atrial fibrillation 03/06/2012   Paroxysmal atrial fibrillation ([place]) 02/29/2012   Bitten or stung by nonvenomous insect and other nonvenomous arthropods, initial encounter 12/03/2011   Depression screening 10/17/2011   CAD (coronary artery disease), native coronary artery    Hyperlipidemia    Hypertension    Chronic venous insufficiency    Gout    Benign prostatic hyperplasia    Post-traumatic stress disorder 03/13/2011   Pain in left leg 12/08/2010   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   Fatigue 08/08/2010   Retention of urine 04/18/2010   Constipation 07/05/2009   Kidney stone 07/05/2009   ED (erectile dysfunction) of organic origin 12/14/2008   Impaired fasting glucose 12/14/2008   Vitamin B deficiency 12/14/2008   Past Medical History:  Past Medical  History:  Diagnosis Date   AAA (abdominal aortic aneurysm) (HCC)    Arthritis    Ascending aorta dilatation (HCC) 03/13/2012   Fusiform dilatation of the ascending thoracic  aorta discovered during surgery, max diameter 4.2-4.3 cm   BPH (benign prostatic hypertrophy)    Chronic systolic heart failure ([place]) 03/03/2012   Chronic venous insufficiency    GERD (gastroesophageal reflux disease)    Glaucoma    Gout    History of kidney stones    Hyperlipidemia    Hypertension    Obesity (BMI 30-39.9)    Paroxysmal atrial fibrillation ([place]) 02/29/2012   Recurrent paroxysmal, new-onset    PMR (polymyalgia rheumatica) (HCC)    Polymyalgia rheumatica ([place]) 02/06/2019   Pre-diabetes    S/P CABG x 3 03/06/2012   LIMA to LAD, SVG to OM, SVG to RCA, EVH via right thigh   S/P Maze operation for atrial fibrillation 03/06/2012   Complete bilateral lesions set using bipolar radiofrequency and cryothermy ablation with clipping of LA appendage   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   s/p TAVR with a 26 mm Edwards via the left subclavian by Drs Buena Irish and Bartle   Severe aortic stenosis 09/29/2019   Sleep apnea    USES CPAP NIGHTLY   Past Surgical History:  Past Surgical History:  Procedure Laterality Date   ABDOMINAL AORTOGRAM W/LOWER EXTREMITY N/A 10/26/2020   Procedure: ABDOMINAL AORTOGRAM W/LOWER EXTREMITY;  Surgeon: Broadus , MD;  Location: [place] [facility];  Service: Cardiovascular;  Laterality: N/A;   CARDIAC CATHETERIZATION     CATARACT EXTRACTION, BILATERAL     with lens implants   CHOLECYSTECTOMY N/A 03/24/2012   Procedure: LAPAROSCOPIC CHOLECYSTECTOMY WITH INTRAOPERATIVE CHOLANGIOGRAM;  Surgeon: Zenovia Jarred, MD;  Location: [place];  Service: General;  Laterality: N/A;   CORONARY ARTERY BYPASS GRAFT N/A 03/06/2012   Procedure: CORONARY ARTERY BYPASS GRAFTING (CABG);  Surgeon: Rexene Alberts, MD;  Location: [place];  Service: Open Heart Surgery;  Laterality: N/A;   ENDOVEIN HARVEST OF GREATER SAPHENOUS VEIN Right 03/06/2012   Procedure: ENDOVEIN HARVEST OF GREATER SAPHENOUS VEIN;  Surgeon: Rexene Alberts, MD;  Location: [place];   Service: Open Heart Surgery;  Laterality: Right;   INTRAOPERATIVE TRANSESOPHAGEAL ECHOCARDIOGRAM N/A 03/06/2012   Procedure: INTRAOPERATIVE TRANSESOPHAGEAL ECHOCARDIOGRAM;  Surgeon: Rexene Alberts, MD;  Location: [place];  Service: Open Heart Surgery;  Laterality: N/A;   IR KYPHO LUMBAR INC FX REDUCE BONE BX UNI/BIL CANNULATION INC/IMAGING  02/23/2020   LEFT HEART CATH Right 03/02/2012   Procedure: LEFT HEART CATH;  Surgeon: Sherren Mocha, MD;  Location: [facility];  Service: Cardiovascular;  Laterality: Right;   MAZE N/A 03/06/2012   Procedure: MAZE;  Surgeon: Rexene Alberts, MD;  Location: [place];  Service: Open Heart Surgery;  Laterality: N/A;   PERIPHERAL VASCULAR INTERVENTION Right 10/26/2020   Procedure: PERIPHERAL VASCULAR INTERVENTION;  Surgeon: Broadus , MD;  Location: [place] [facility];  Service: Cardiovascular;  Laterality: Right;   PROSTATECTOMY     partial   RIGHT/LEFT HEART CATH AND CORONARY/GRAFT ANGIOGRAPHY N/A 09/29/2019   Procedure: RIGHT/LEFT HEART CATH AND CORONARY/GRAFT ANGIOGRAPHY;  Surgeon: Adrian Prows, MD;  Location: [place] [facility];  Service: Cardiovascular;  Laterality: N/A;   TEE WITHOUT CARDIOVERSION N/A 11/10/2019   Procedure: TRANSESOPHAGEAL ECHOCARDIOGRAM (TEE);  Surgeon: Burnell Blanks, MD;  Location: [place];  Service: Open Heart Surgery;  Laterality: N/A;   TRANSCATHETER AORTIC VALVE REPLACEMENT, TRANSFEMORAL  11/10/2019  HPI:  Patient is a 88 y.o. male with PMH: AAA; CAD s/p CABG; chronic systolic CHF; afib s/p Maze, on Xarelto; HTN; HLD; PMR (currently on steroids); pre-diabetes; OSA on CPAP; PAD with lymphedema; spinal compression fx; and AS s/p TAVR. He presented to ED on 05/25/21 with c/o right sided weakness and difficulty speaking. MRI revealed small areas of infarction in right occipital lobe and left frontal lobe, mild to moderate acute infarct left operculum; possible cerebral emboli.   Assessment / Plan / Recommendation Clinical  Impression  Patient presents with primary apraxia of speech as per this speech-language evaluation but with cognition and language abilities [facility]. He did not appear with any dysarthria of speech and during oral-motor exam, only exhibited some mild weakness on left side of face/lips. Initially, SLP was observing patient to make phonemic paraphasias however as testing progressed, this appeared more related to motor speech disorder. Patient demonstrated awareness to errors and was able to correct at times but with longer syllable word/phrases he was not able to successfully self-correct. SLP observed patient to grope for words and sounds, have significant difficulty with diadochokinetic skills, impaired sequencing of phoneme production of words, such as producing final phoneme in initial position. Confrontational naming, convergent and divergent naming all appeared [facility]. As patient is awaiting discharge to acute inpatient rehab, recommendation is for him to receive skilled SLP services at this next venue of care.    SLP Assessment  SLP Recommendation/Assessment: All further Speech Lanaguage Pathology  needs can be addressed in the next venue of care SLP Visit Diagnosis: Apraxia (R48.2)    Recommendations for follow up therapy are one component of a multi-disciplinary discharge planning process, led by the attending physician.  Recommendations may be updated based on patient status, additional functional criteria and insurance authorization.    Follow Up Recommendations  Acute inpatient rehab (3hours/day)    Assistance Recommended at Discharge  Intermittent Supervision/Assistance  Functional Status Assessment Patient has had a recent decline in their functional status and demonstrates the ability to make significant improvements in function in a reasonable and predictable amount of time.  Frequency and Duration   N/A        SLP Evaluation Cognition  Overall Cognitive Status: Within Functional Limits  for tasks assessed Arousal/Alertness: Awake/alert Orientation Level: Oriented X4 Year: 2023 Month: May Day of Week: Correct Awareness: Appears intact Problem Solving: Appears intact Safety/Judgment: Appears intact       Comprehension  Auditory Comprehension Overall Auditory Comprehension: Appears within functional limits for tasks assessed    Expression Expression Primary Mode of Expression: Verbal Verbal Expression Overall Verbal Expression: Appears within functional limits for tasks assessed Initiation: No impairment Level of Generative/Spontaneous Verbalization: Sentence;Conversation Repetition: Impaired Level of Impairment: Word level Naming: No impairment Pragmatics: No impairment Non-Verbal Means of Communication: Not applicable   Oral / Motor  Oral Motor/Sensory Function Overall Oral Motor/Sensory Function: Mild impairment Facial ROM: Reduced left Facial Symmetry: Abnormal symmetry left Facial Strength: Within Functional Limits Facial Sensation: Within Functional Limits Lingual ROM: Within Functional Limits Lingual Symmetry: Within Functional Limits Lingual Strength: Within Functional Limits Lingual Sensation: Within Functional Limits Velum: Within Functional Limits Mandible: Within Functional Limits Motor Speech Overall Motor Speech: Impaired Respiration: Within functional limits Phonation: Normal Resonance: Within functional limits Articulation: Within functional limitis Motor Planning: Impaired Level of Impairment: Word Motor Speech Errors: Aware;Groping for words;Inconsistent Effective Techniques: Slow rate;Pause            Sonia Baller, MA, CCC-SLP Speech Therapy

## 2021-05-29 NOTE — Progress Notes (Signed)
Occupational Therapy Treatment Patient Details Name: [name] [name] MRN: [id number] DOB: [birth year] Today's Date: 05/29/2021   History of present illness Pt is a 88 y.o. male who presented to the ED 5/25 with c/o R side weakness and difficulty speaking. MRI revealed small areas of acute infarction in the right occipital lobe and left frontal lobe over the convexity; mild to moderate acute infarct left operculum; possible cerebral emboli. PMH:  AAA; CAD s/p CABG; chronic systolic CHF; afib s/p Maze, on Xarelto; HTN; HLD; PMR (currently on steroids); pre-diabetes; OSA on CPAP; PAD with lymphedema; spinal compression fx; and AS s/p TAVR   OT comments  Pt premedicated for back pain prior to session and able to perform bed mobility with min assist and transfer with RW to chair from elevated bed with +2 min assist. Pt completed grooming with min assist in chair with back slightly reclined. Educated in use of foam build up for toothbrush and/or eating utensils. Continues to be appropriate for AIR.    Recommendations for follow up therapy are one component of a multi-disciplinary discharge planning process, led by the attending physician.  Recommendations may be updated based on patient status, additional functional criteria and insurance authorization.    Follow Up Recommendations  Acute inpatient rehab (3hours/day)    Assistance Recommended at Discharge Frequent or constant Supervision/Assistance  Patient can return home with the following  Two people to help with walking and/or transfers;Two people to help with bathing/dressing/bathroom;Assistance with cooking/housework;Direct supervision/assist for medications management;Direct supervision/assist for financial management;Assist for transportation;Help with stairs or ramp for entrance   Equipment Recommendations  None recommended by OT    Recommendations for Other Services      Precautions / Restrictions Precautions Precautions:  Fall Restrictions Weight Bearing Restrictions: No       Mobility Bed Mobility Overal bed mobility: Needs Assistance Bed Mobility: Rolling, Sidelying to Sit Rolling: Min assist Sidelying to sit: HOB elevated, Min assist       General bed mobility comments: cues for log roll technique, increased time and use of rail; assist to scoot hips to EOB    Transfers Overall transfer level: Needs assistance Equipment used: Rolling walker (2 wheels) Transfers: Sit to/from Stand, Bed to chair/wheelchair/BSC Sit to Stand: +2 physical assistance, Min assist     Step pivot transfers: +2 physical assistance, Min assist     General transfer comment: +2 min assist to stand from elevated bed allowing pt to pull up on walker with one hand; +2 min to push up from recliner (seated elevated with folded blankets)     Balance Overall balance assessment: Needs assistance Sitting-balance support: Feet supported, Single extremity supported Sitting balance-Leahy Scale: Poor Sitting balance - Comments: pt requires at least one hand on bed to offload back pain, but prefers 2   Standing balance support: Bilateral upper extremity supported, Reliant on assistive device for balance Standing balance-Leahy Scale: Poor Standing balance comment: +2 min assist with posterior lean and pt stabilizing LEs on bed                           ADL either performed or assessed with clinical judgement   ADL Overall ADL's : Needs assistance/impaired   Eating/Feeding Details (indicate cue type and reason): issued foam build up for eating utensils Grooming: Oral care;Sitting;Minimal assistance Grooming Details (indicate cue type and reason): used R hand with built up handle on toothbrush  Lower Body Dressing: Total assistance;Bed level Lower Body Dressing Details (indicate cue type and reason): for socks                    Extremity/Trunk Assessment              Vision        Perception     Praxis      Cognition Arousal/Alertness: Awake/alert Behavior During Therapy: WFL for tasks assessed/performed Overall Cognitive Status: Impaired/Different from baseline Area of Impairment: Memory, Problem solving                     Memory: Decreased short-term memory       Problem Solving: Slow processing          Exercises      Shoulder Instructions       General Comments      Pertinent Vitals/ Pain       Pain Assessment Pain Assessment: Faces Faces Pain Scale: Hurts whole lot Pain Location: back Pain Descriptors / Indicators: Grimacing, Discomfort, Moaning Pain Intervention(s): Premedicated before session, Monitored during session, Repositioned  Home Living     Available Help at Discharge: Family;Available 24 hours/day;Personal care attendant Type of Home: House                              Lives With: Spouse    Prior Functioning/Environment              Frequency  Min 3X/week        Progress Toward Goals  OT Goals(current goals can now be found in the care plan section)  Progress towards OT goals: Progressing toward goals  Acute Rehab OT Goals OT Goal Formulation: With patient Time For Goal Achievement: 06/09/21 Potential to Achieve Goals: Good  Plan Discharge plan remains appropriate    Co-evaluation    PT/OT/SLP Co-Evaluation/Treatment: Yes Reason for Co-Treatment: For patient/therapist safety;Complexity of the patient's impairments (multi-system involvement) PT goals addressed during session: Mobility/safety with mobility;Balance;Proper use of DME;Strengthening/ROM OT goals addressed during session: ADL's and self-care      AM-PAC OT "6 Clicks" Daily Activity     Outcome Measure   Help from another person eating meals?: A Little Help from another person taking care of personal grooming?: A Little Help from another person toileting, which includes using toliet, bedpan, or urinal?:  Total Help from another person bathing (including washing, rinsing, drying)?: A Lot Help from another person to put on and taking off regular upper body clothing?: A Little Help from another person to put on and taking off regular lower body clothing?: Total 6 Click Score: 13    End of Session Equipment Utilized During Treatment: Gait belt;Rolling walker (2 wheels)  OT Visit Diagnosis: Unsteadiness on feet (R26.81);Other abnormalities of gait and mobility (R26.89);Pain;Muscle weakness (generalized) (M62.81);Hemiplegia and hemiparesis;Other symptoms and signs involving cognitive function Hemiplegia - Right/Left: Right Hemiplegia - dominant/non-dominant: Dominant Hemiplegia - caused by: Cerebral infarction   Activity Tolerance Patient limited by pain;Patient tolerated treatment well   Patient Left in chair;with call bell/phone within reach;with chair alarm set;with family/visitor present   Nurse Communication Mobility status (RN premedicated for pain)        Time: 7425-9563 OT Time Calculation (min): 33 min  Charges: OT General Charges $OT Visit: 1 Visit OT Treatments $Self Care/Home Management : 8-22 mins  Nestor Lewandowsky, OTR/L Acute Rehabilitation Services Pager: [phone] Office: [phone]  Werner Lean,  [facility] [place] 05/29/2021, 3:17 PM

## 2021-05-29 NOTE — Progress Notes (Signed)
Inpatient Rehab Admissions Coordinator:   I do not yet have insurance auth or a CIR bed for this Pt. I will continue to follow for potential admit pending insurance auth.   Clemens Catholic, [place], [place] Admissions Coordinator  [phone] ([place]) [phone] (office)

## 2021-05-30 ENCOUNTER — Other Ambulatory Visit (HOSPITAL_COMMUNITY): Payer: Self-pay

## 2021-05-30 DIAGNOSIS — I639 Cerebral infarction, unspecified: Secondary | ICD-10-CM | POA: Diagnosis not present

## 2021-05-30 LAB — CREATININE, SERUM
Creatinine, Ser: 1.7 mg/dL — ABNORMAL HIGH (ref 0.61–1.24)
GFR, Estimated: 37 mL/min — ABNORMAL LOW (ref >60)

## 2021-05-30 MED ORDER — POTASSIUM CHLORIDE CRYS ER 20 MEQ PO TBCR
40.0000 meq | EXTENDED_RELEASE_TABLET | Freq: Once | ORAL | Status: AC
  Administered 2021-05-30: 40 meq via ORAL
  Filled 2021-05-30: qty 2

## 2021-05-30 MED ORDER — PREDNISONE 1 MG PO TABS
3.0000 mg | ORAL_TABLET | Freq: Every day | ORAL | Status: DC
  Administered 2021-05-31: 3 mg via ORAL
  Filled 2021-05-30: qty 3

## 2021-05-30 NOTE — Progress Notes (Signed)
PROGRESS NOTE        PATIENT DETAILS Name: [name] [name] Age: 88 y.o. Sex: male Date of Birth: 12/26/1926 Admit Date: 05/25/2021 Admitting Physician Karmen Bongo, MD OEV:[id number], Elta [place], MD  Brief Summary: Patient is a 88 year old male with with history of CAD s/p CABG (2014), paroxysmal atrial fibrillation s/p maze procedure-on Xarelto, AAS-s/p TAVR (2021), HFpEF HTN, HLD, PMR, OSA on CPAP, PAD s/p lymphedema-presented with dysarthria, right sided weakness-patient was found to have acute CVA and subsequently admitted to the hospitalist service.  See below for further details.   Significant events: 5/25>> dysarthria/right-sided weakness-CVA on MRI-admit to [facility].  Significant studies: 5/25>> CT head: Small left MCA territory cortical infarct 5/25>> CTA head: No intracranial LVO-multifocal severe stenosis within the right vertebral artery, moderate stenosis proximal V4 left vertebral artery, moderate stenosis paraclinoid right ICA 5/25>> CTA neck: No LVO-no hemodynamically significant stenosis. 5/25>>MRI Brain: Acute infarction right occipital lobe, left frontal lobe, left operculum 5/25>> A1c 5.8 5/25>> LDL: 122 5/26>> Echo-65-70%  Significant microbiology data:   Procedures: None  Consults: Neurology  Subjective: Dysarthria unchanged-awake/alert not in any distress.  Awaiting insurance authorization to go to CIR.  Objective: Vitals: Blood pressure (!) 147/62, pulse 70, temperature 97.9 F (36.6 C), temperature source Oral, resp. rate 15, height 6' (1.829 m), weight 100.6 kg, SpO2 97 %.   Exam: Gen Exam:Alert awake-not in any distress HEENT:atraumatic, normocephalic Chest: B/L clear to auscultation anteriorly CVS:S1S2 regular Abdomen:soft non tender, non distended Extremities:no edema Neurology: Non focal Skin: no rash   Pertinent Labs/Radiology:    Latest Ref Rng & Units 05/28/2021   12:34 AM 05/26/2021    6:05 AM 05/25/2021   11:27 AM   CBC  WBC 4.0 - 10.5 K/uL 4.6   4.3     Hemoglobin 13.0 - 17.0 g/dL 8.6   8.3   10.5    Hematocrit 39.0 - 52.0 % 28.2   26.5   31.0    Platelets 150 - 400 K/uL 126   108       Lab Results  Component Value Date   NA 140 05/26/2021   K 3.5 05/26/2021   CL 105 05/26/2021   CO2 26 05/26/2021      Assessment/Plan: Acute embolic CVA: Continues to have some amount of dysarthria-work-up as above-concern for Xarelto failure-and has been switched to Eliquis.  Recommendations from PT/OT for CIR.    PAF: Maintaining sinus rhythm-continue amiodarone-see above regarding anticoagulation.  HLD: LDL not at goal-now on Lipitor 40 mg daily-also on Zetia.    HTN: BP relatively stable-continue Imdur  CAD s/p CABG 2014: Stable-has had issues with chest pain a few days ago-relieved by nitroglycerin.    History of severe AS-s/p TAVR procedure 2021: Echo showing stable prosthetic aortic valve.  Chronic HFpEF: Euvolemic-continue Demadex.  PAD s/p balloon angioplasty of posterior tibial artery October 2022: Vascular surgery in the outpatient setting  History of RA/PMR: On prednisone and leflunomide.  Normocytic anemia: Attributed to CKD/chronic disease-being followed by Dr. Alen Blew in the outpatient setting  CKD stage IIIb: Creatinine close to baseline-follow periodically.  UTI: Diagnosed prior to this hospitalization-on Omnicef.  BPH: Stable-continue finasteride  Dementia: Appears to be mild-continue Namenda and galantamine  Bilateral lower extremity wounds: Being followed by wound care in the outpatient setting  Obesity: Estimated body mass index is 30.08 kg/m as calculated from the following:  Height as of this encounter: 6' (1.829 m).   Weight as of this encounter: 100.6 kg.   Code status:   Code Status: DNR   DVT Prophylaxis:apixaban (ELIQUIS) tablet 2.5 mg Start: 05/27/21 1000 Place and maintain sequential compression device Start: 05/26/21 1453 apixaban (ELIQUIS) tablet 2.5 mg     Family Communication: Spouse-Susan-[phone]-updated on the phone on 5/27   Disposition Plan: Status is: Inpatient Remains inpatient appropriate because: Stable for discharge to [facility] authorization for CIR.   Planned Discharge Destination: CIR    Diet: Diet Order             Diet Heart Room service appropriate? Yes; Fluid consistency: Thin  Diet effective now                     Antimicrobial agents: Anti-infectives (From admission, onward)    Start     Dose/Rate Route Frequency Ordered Stop   05/25/21 2200  cefdinir (OMNICEF) capsule 300 mg       Note to Pharmacy: Started on 05-23-21 7 days   300 mg Oral 2 times daily 05/25/21 1522 05/30/21 2359        MEDICATIONS: Scheduled Meds:  amiodarone  200 mg Oral Daily   apixaban  2.5 mg Oral BID   atorvastatin  40 mg Oral Daily   cefdinir  300 mg Oral BID   ezetimibe  10 mg Oral Daily   feeding supplement  237 mL Oral BID BM   finasteride  5 mg Oral Daily   gabapentin  200 mg Oral BID   And   gabapentin  100 mg Oral QPM   galantamine  8 mg Oral Q breakfast   isosorbide mononitrate  60 mg Oral QPC supper   leflunomide  10 mg Oral Daily   lidocaine  1 patch Transdermal Q24H   memantine  10 mg Oral BID   multivitamin with minerals  1 tablet Oral Daily   pantoprazole  40 mg Oral QODAY   predniSONE  5 mg Oral Q breakfast   torsemide  40 mg Oral Daily   Continuous Infusions: PRN Meds:.acetaminophen **OR** acetaminophen (TYLENOL) oral liquid 160 mg/5 mL **OR** acetaminophen, hydrALAZINE, nitroGLYCERIN, senna-docusate, traMADol   I have personally reviewed following labs and imaging studies  LABORATORY DATA: CBC: Recent Labs  Lab 05/25/21 1124 05/25/21 1127 05/26/21 0605 05/28/21 0034  WBC 5.6  --  4.3 4.6  NEUTROABS 3.6  --   --   --   HGB 9.9* 10.5* 8.3* 8.6*  HCT 31.1* 31.0* 26.5* 28.2*  MCV 94.2  --  91.4 92.8  PLT 123*  --  108* 126*     Basic Metabolic Panel: Recent  Labs  Lab 05/25/21 1124 05/25/21 1127 05/26/21 0605 05/27/21 0815 05/28/21 0034 05/30/21 0448  NA 140 138 140  --   --   --   K 3.5 3.4* 3.5  --   --   --   CL 104 102 105  --   --   --   CO2 27  --  26  --   --   --   GLUCOSE 115* 111* 103*  --   --   --   BUN 26* 30* 23  --   --   --   CREATININE 1.71* 1.70* 1.62* 1.59* 1.52* 1.70*  CALCIUM 10.0  --  9.3  --   --   --      GFR: Estimated Creatinine Clearance: 34 mL/min (A) (by  C-G formula based on SCr of 1.7 mg/dL (H)).  Liver Function Tests: Recent Labs  Lab 05/25/21 1124  AST 27  ALT 20  ALKPHOS 94  BILITOT 0.6  PROT 6.8  ALBUMIN 3.3*    No results for input(s): LIPASE, AMYLASE in the last 168 hours. No results for input(s): AMMONIA in the last 168 hours.  Coagulation Profile: Recent Labs  Lab 05/25/21 1124  INR 1.6*     Cardiac Enzymes: No results for input(s): CKTOTAL, CKMB, CKMBINDEX, TROPONINI in the last 168 hours.  BNP (last 3 results) No results for input(s): PROBNP in the last 8760 hours.  Lipid Profile: No results for input(s): CHOL, HDL, LDLCALC, TRIG, CHOLHDL, LDLDIRECT in the last 72 hours.   Thyroid Function Tests: No results for input(s): TSH, T4TOTAL, FREET4, T3FREE, THYROIDAB in the last 72 hours.  Anemia Panel: No results for input(s): VITAMINB12, FOLATE, FERRITIN, TIBC, IRON, RETICCTPCT in the last 72 hours.  Urine analysis:    Component Value Date/Time   COLORURINE YELLOW 03/14/2021 0011   APPEARANCEUR HAZY (A) 03/14/2021 0011   LABSPEC 1.014 03/14/2021 0011   PHURINE 6.0 03/14/2021 0011   GLUCOSEU NEGATIVE 03/14/2021 0011   HGBUR SMALL (A) 03/14/2021 0011   BILIRUBINUR NEGATIVE 03/14/2021 0011   KETONESUR NEGATIVE 03/14/2021 0011   PROTEINUR NEGATIVE 03/14/2021 0011   UROBILINOGEN 1.0 03/22/2012 0845   NITRITE NEGATIVE 03/14/2021 0011   LEUKOCYTESUR MODERATE (A) 03/14/2021 0011    Sepsis Labs: Lactic Acid, Venous    Component Value Date/Time   LATICACIDVEN 1.3  10/22/2020 1417    MICROBIOLOGY: No results found for this or any previous visit (from the past 240 hour(s)).  RADIOLOGY STUDIES/RESULTS: No results found.   LOS: 5 days   Oren Binet, MD  [facility]    To contact the attending provider between 7A-7P or the covering provider during after hours 7P-7A, please log into the web site [email or web address] and access using universal [place] password for that web site. If you do not have the password, please call the hospital operator.  05/30/2021, 11:45 AM

## 2021-05-30 NOTE — Discharge Instructions (Signed)

## 2021-05-30 NOTE — Progress Notes (Signed)
Speech Language Pathology Treatment: Cognitive-Linquistic  Patient Details Name: [name] [name] MRN: [id number] DOB: January 04, 1926 Today's Date: 05/30/2021 Time: 4462-8638 SLP Time Calculation (min) (ACUTE ONLY): 20 min  Assessment / Plan / Recommendation Clinical Impression  Patient seen by SLP for skilled treatment session focused on speech apraxia. His wife arrived during session. Patient reported he has found that it takes him much longer to be able to correctly say phone numbers but he is able to practice them. As  he likes to read, he also was thinking that reading magazines out loud could help. SLP guided patient in exercises of reading out loud words and phrases of increasing number of syllables and patient was observed to slow down during production without needing cues. He then read aloud the headlines in one of his magazines and SLP observed him to exhibit some articulation errors with /sh/, /ch/, /l/ and /r/ blends. SLP showed patient how to isolate a difficult word, break it up into syllables and phonemes to practice. SLP also demonstrated slowing down speech rate considerably to increase awareness of placement and transition of his articulators (tongue, lips, jaw). Patient is expected to continue to improve with practice and he is motivated to do so. His wife asked SLP about patient's diet which she said did not allow for salads and foods were cut up. SLP reviewed this and saw that patient was indeed on Dys 3 (mechanical soft) solids, however dysphagia has not been reported. SLP secure message chatted with MD who was in agreement to change diet to regular texture solids. SLP will continue to follow patient acutely.    HPI HPI: Patient is a 88 y.o. male with PMH: AAA; CAD s/p CABG; chronic systolic CHF; afib s/p Maze, on Xarelto; HTN; HLD; PMR (currently on steroids); pre-diabetes; OSA on CPAP; PAD with lymphedema; spinal compression fx; and AS s/p TAVR. He presented to ED on 05/25/21 with c/o  right sided weakness and difficulty speaking. MRI revealed small areas of infarction in right occipital lobe and left frontal lobe, mild to moderate acute infarct left operculum; possible cerebral emboli.      SLP Plan  Continue with current plan of care      Recommendations for follow up therapy are one component of a multi-disciplinary discharge planning process, led by the attending physician.  Recommendations may be updated based on patient status, additional functional criteria and insurance authorization.    Recommendations  Diet recommendations: Regular;Thin liquid                Follow Up Recommendations: Acute inpatient rehab (3hours/day) Assistance recommended at discharge: Intermittent Supervision/Assistance SLP Visit Diagnosis: Apraxia (R48.2) Plan: Continue with current plan of care           Sonia Baller, MA, CCC-SLP Speech Therapy

## 2021-05-30 NOTE — TOC Benefit Eligibility Note (Signed)
Patient Teacher, English as a foreign language completed.    The patient is currently admitted and upon discharge could be taking Eliquis 5 mg.  The current 30 day co-pay is, $141.82.   The patient is insured through [employer], [place] Patient Advocate Specialist [place] Patient Advocate Team Direct Number: [phone]  Fax: [phone]

## 2021-05-30 NOTE — Progress Notes (Signed)
Physical Therapy Treatment Patient Details Name: [name] [name] MRN: [id number] DOB: [birth year] Today's Date: 05/30/2021   History of Present Illness Pt is a 88 y.o. male who presented to the ED 5/25 with c/o R side weakness and difficulty speaking. MRI revealed small areas of acute infarction in the right occipital lobe and left frontal lobe over the convexity; mild to moderate acute infarct left operculum; possible cerebral emboli. PMH:  AAA; CAD s/p CABG; chronic systolic CHF; afib s/p Maze, on Xarelto; HTN; HLD; PMR (currently on steroids); pre-diabetes; OSA on CPAP; PAD with lymphedema; spinal compression fx; and AS s/p TAVR    PT Comments    Patient initially doing better with ability to transfer OOB with +1 assist. Continued from recliner to perform repeated sit to stands with pt becoming dizzy after 5th rep and noted palor. BP down to 93/58; assisted back to bed and noted incr weakness of RUE and incr slurred speech. RN called. Supine BP 94/47 with feet elevated.  RN in to assess pt with speech improved and RUE weakness improved.  Pt supine with feet elevated in RN's care at end of session   Recommendations for follow up therapy are one component of a multi-disciplinary discharge planning process, led by the attending physician.  Recommendations may be updated based on patient status, additional functional criteria and insurance authorization.  Follow Up Recommendations  Acute inpatient rehab (3hours/day)     Assistance Recommended at Discharge PRN  Patient can return home with the following Assistance with cooking/housework;A lot of help with bathing/dressing/bathroom;Two people to help with walking and/or transfers;Assist for transportation   Equipment Recommendations  None recommended by PT    Recommendations for Other Services Rehab consult     Precautions / Restrictions Precautions Precautions: Fall Restrictions Weight Bearing Restrictions: No     Mobility  Bed  Mobility Overal bed mobility: Needs Assistance Bed Mobility: Rolling, Sidelying to Sit, Sit to Sidelying Rolling: Min assist Sidelying to sit: Min assist     Sit to sidelying: Mod assist General bed mobility comments: cues for log roll technique, increased time and use of rail; assist to raise torso; return assist to lift legs    Transfers Overall transfer level: Needs assistance Equipment used: Rolling walker (2 wheels) Transfers: Sit to/from Stand, Bed to chair/wheelchair/BSC Sit to Stand: Min assist, Mod assist   Step pivot transfers: Min assist, Mod assist       General transfer comment: Min assist from elevated bed and elevated recliner x 3 reps; last 3 reps from recliner with mod assist; pivot back to bed due to orthostasis with mod assist with RUE slipping off handle of RW x 2    Ambulation/Gait Ambulation/Gait assistance: Min assist Gait Distance (Feet): 6 Feet (3 fwd, 3 backward) Assistive device: Rolling walker (2 wheels) Gait Pattern/deviations: Step-to pattern, Decreased stride length, Trunk flexed   Gait velocity interpretation: <1.8 ft/sec, indicate of risk for recurrent falls   General Gait Details: pt reports he can walk up to 30 ft, but then wife states they always follow with w/c; after 3 feet pt felt he needed to return to chair   Stairs             Wheelchair Mobility    Modified Rankin (Stroke Patients Only)       Balance Overall balance assessment: Needs assistance Sitting-balance support: Feet supported, Single extremity supported Sitting balance-Leahy Scale: Poor Sitting balance - Comments: pt requires at least one hand on bed to offload back pain,  but prefers 2   Standing balance support: Bilateral upper extremity supported, Reliant on assistive device for balance Standing balance-Leahy Scale: Poor                              Cognition Arousal/Alertness: Awake/alert Behavior During Therapy: WFL for tasks  assessed/performed Overall Cognitive Status: Impaired/Different from baseline Area of Impairment: Memory, Problem solving, Following commands, Awareness                     Memory: Decreased short-term memory Following Commands: Follows one step commands with increased time   Awareness: Emergent Problem Solving: Slow processing, Requires verbal cues          Exercises Other Exercises Other Exercises: sit to stand from elevated seat of recliner with cues for hand placement (to stand and to sit); initally min assist with progression to needing mod assist    General Comments General comments (skin integrity, edema, etc.): After sit to stand x 5 reps from recliner, pt began to get pale and reported dizziness. BP 93/58; assisted back to bed with supine BP 94/47 and RN in to assess pt with incr slurred speech and incr RUE weakness. Pt supine with feet elevated in RN's care at end of session      Pertinent Vitals/Pain Pain Assessment Pain Score: 5  Pain Location: back Pain Descriptors / Indicators: Grimacing, Discomfort, Moaning Pain Intervention(s): Limited activity within patient's tolerance, Premedicated before session, Monitored during session    Home Living                          Prior Function            PT Goals (current goals can now be found in the care plan section) Acute Rehab PT Goals Patient Stated Goal: home PT Goal Formulation: With patient Time For Goal Achievement: 06/09/21 Potential to Achieve Goals: Good Progress towards PT goals: Progressing toward goals    Frequency    Min 4X/week      PT Plan Current plan remains appropriate    Co-evaluation              AM-PAC PT "6 Clicks" Mobility   Outcome Measure  Help needed turning from your back to your side while in a flat bed without using bedrails?: A Lot Help needed moving from lying on your back to sitting on the side of a flat bed without using bedrails?: A Lot Help  needed moving to and from a bed to a chair (including a wheelchair)?: A Lot Help needed standing up from a chair using your arms (e.g., wheelchair or bedside chair)?: A Lot Help needed to walk in hospital room?: Total Help needed climbing 3-5 steps with a railing? : Total 6 Click Score: 10    End of Session Equipment Utilized During Treatment: Gait belt Activity Tolerance: Patient limited by pain;Treatment limited secondary to medical complications (Comment) (orthostasis) Patient left: with call bell/phone within reach;with family/visitor present;in bed;with nursing/sitter in room Nurse Communication: Mobility status;Other (comment) (orthostatic with incr neuro symptoms) PT Visit Diagnosis: Other abnormalities of gait and mobility (R26.89);Difficulty in walking, not elsewhere classified (R26.2);Muscle weakness (generalized) (M62.81);Pain     Time: 8413-2440 PT Time Calculation (min) (ACUTE ONLY): 50 min  Charges:  $Gait Training: 23-37 mins $Therapeutic Activity: 8-22 mins  Arby Barrette, PT Acute Rehabilitation Services  Pager [phone] Office [phone]    Rexanne Mano 05/30/2021, 11:53 AM

## 2021-05-30 NOTE — Progress Notes (Signed)
Pt wearing his home CPAP (self administered) resting well with no issues at this time.

## 2021-05-31 ENCOUNTER — Other Ambulatory Visit: Payer: Self-pay

## 2021-05-31 ENCOUNTER — Encounter (HOSPITAL_BASED_OUTPATIENT_CLINIC_OR_DEPARTMENT_OTHER): Payer: Medicare Other | Admitting: Physician Assistant

## 2021-05-31 ENCOUNTER — Inpatient Hospital Stay (HOSPITAL_COMMUNITY)
Admission: RE | Admit: 2021-05-31 | Discharge: 2021-06-14 | DRG: 056 | Disposition: A | Discharge status: Home Health | Payer: Medicare Other | Source: Intra-hospital | Attending: Physical Medicine & Rehabilitation | Admitting: Physical Medicine & Rehabilitation

## 2021-05-31 ENCOUNTER — Encounter (HOSPITAL_COMMUNITY): Payer: Self-pay | Admitting: Physical Medicine & Rehabilitation

## 2021-05-31 DIAGNOSIS — I480 Paroxysmal atrial fibrillation: Secondary | ICD-10-CM

## 2021-05-31 DIAGNOSIS — M109 Gout, unspecified: Secondary | ICD-10-CM | POA: Diagnosis present

## 2021-05-31 DIAGNOSIS — M21371 Foot drop, right foot: Secondary | ICD-10-CM | POA: Diagnosis present

## 2021-05-31 DIAGNOSIS — H409 Unspecified glaucoma: Secondary | ICD-10-CM | POA: Diagnosis present

## 2021-05-31 DIAGNOSIS — Z885 Allergy status to narcotic agent status: Secondary | ICD-10-CM

## 2021-05-31 DIAGNOSIS — R319 Hematuria, unspecified: Secondary | ICD-10-CM | POA: Diagnosis not present

## 2021-05-31 DIAGNOSIS — I69351 Hemiplegia and hemiparesis following cerebral infarction affecting right dominant side: Principal | ICD-10-CM

## 2021-05-31 DIAGNOSIS — I69398 Other sequelae of cerebral infarction: Secondary | ICD-10-CM

## 2021-05-31 DIAGNOSIS — I63512 Cerebral infarction due to unspecified occlusion or stenosis of left middle cerebral artery: Secondary | ICD-10-CM | POA: Diagnosis present

## 2021-05-31 DIAGNOSIS — I5022 Chronic systolic (congestive) heart failure: Secondary | ICD-10-CM | POA: Diagnosis present

## 2021-05-31 DIAGNOSIS — M353 Polymyalgia rheumatica: Secondary | ICD-10-CM | POA: Diagnosis not present

## 2021-05-31 DIAGNOSIS — I739 Peripheral vascular disease, unspecified: Secondary | ICD-10-CM | POA: Diagnosis present

## 2021-05-31 DIAGNOSIS — D631 Anemia in chronic kidney disease: Secondary | ICD-10-CM | POA: Diagnosis present

## 2021-05-31 DIAGNOSIS — Z953 Presence of xenogenic heart valve: Secondary | ICD-10-CM

## 2021-05-31 DIAGNOSIS — Z7901 Long term (current) use of anticoagulants: Secondary | ICD-10-CM

## 2021-05-31 DIAGNOSIS — L89513 Pressure ulcer of right ankle, stage 3: Secondary | ICD-10-CM | POA: Diagnosis not present

## 2021-05-31 DIAGNOSIS — Z7952 Long term (current) use of systemic steroids: Secondary | ICD-10-CM

## 2021-05-31 DIAGNOSIS — G4733 Obstructive sleep apnea (adult) (pediatric): Secondary | ICD-10-CM | POA: Diagnosis present

## 2021-05-31 DIAGNOSIS — G8929 Other chronic pain: Secondary | ICD-10-CM | POA: Diagnosis present

## 2021-05-31 DIAGNOSIS — K5900 Constipation, unspecified: Secondary | ICD-10-CM | POA: Diagnosis not present

## 2021-05-31 DIAGNOSIS — I69320 Aphasia following cerebral infarction: Secondary | ICD-10-CM

## 2021-05-31 DIAGNOSIS — Z823 Family history of stroke: Secondary | ICD-10-CM

## 2021-05-31 DIAGNOSIS — S91302A Unspecified open wound, left foot, initial encounter: Secondary | ICD-10-CM | POA: Diagnosis not present

## 2021-05-31 DIAGNOSIS — F0390 Unspecified dementia without behavioral disturbance: Secondary | ICD-10-CM | POA: Diagnosis not present

## 2021-05-31 DIAGNOSIS — Z952 Presence of prosthetic heart valve: Secondary | ICD-10-CM

## 2021-05-31 DIAGNOSIS — Z880 Allergy status to penicillin: Secondary | ICD-10-CM

## 2021-05-31 DIAGNOSIS — R4701 Aphasia: Secondary | ICD-10-CM | POA: Diagnosis not present

## 2021-05-31 DIAGNOSIS — I2510 Atherosclerotic heart disease of native coronary artery without angina pectoris: Secondary | ICD-10-CM | POA: Diagnosis present

## 2021-05-31 DIAGNOSIS — Z951 Presence of aortocoronary bypass graft: Secondary | ICD-10-CM | POA: Diagnosis not present

## 2021-05-31 DIAGNOSIS — L89613 Pressure ulcer of right heel, stage 3: Secondary | ICD-10-CM | POA: Diagnosis not present

## 2021-05-31 DIAGNOSIS — H539 Unspecified visual disturbance: Secondary | ICD-10-CM | POA: Diagnosis present

## 2021-05-31 DIAGNOSIS — E876 Hypokalemia: Secondary | ICD-10-CM | POA: Diagnosis not present

## 2021-05-31 DIAGNOSIS — M21372 Foot drop, left foot: Secondary | ICD-10-CM | POA: Diagnosis present

## 2021-05-31 DIAGNOSIS — R7303 Prediabetes: Secondary | ICD-10-CM | POA: Diagnosis present

## 2021-05-31 DIAGNOSIS — I69390 Apraxia following cerebral infarction: Secondary | ICD-10-CM

## 2021-05-31 DIAGNOSIS — D649 Anemia, unspecified: Secondary | ICD-10-CM | POA: Diagnosis not present

## 2021-05-31 DIAGNOSIS — I714 Abdominal aortic aneurysm, without rupture, unspecified: Secondary | ICD-10-CM | POA: Diagnosis present

## 2021-05-31 DIAGNOSIS — I872 Venous insufficiency (chronic) (peripheral): Secondary | ICD-10-CM | POA: Diagnosis not present

## 2021-05-31 DIAGNOSIS — Z888 Allergy status to other drugs, medicaments and biological substances status: Secondary | ICD-10-CM

## 2021-05-31 DIAGNOSIS — M545 Low back pain, unspecified: Secondary | ICD-10-CM | POA: Diagnosis not present

## 2021-05-31 DIAGNOSIS — N1832 Chronic kidney disease, stage 3b: Secondary | ICD-10-CM | POA: Diagnosis present

## 2021-05-31 DIAGNOSIS — I10 Essential (primary) hypertension: Secondary | ICD-10-CM

## 2021-05-31 DIAGNOSIS — M4850XS Collapsed vertebra, not elsewhere classified, site unspecified, sequela of fracture: Secondary | ICD-10-CM | POA: Diagnosis present

## 2021-05-31 DIAGNOSIS — Z66 Do not resuscitate: Secondary | ICD-10-CM | POA: Diagnosis not present

## 2021-05-31 DIAGNOSIS — I130 Hypertensive heart and chronic kidney disease with heart failure and stage 1 through stage 4 chronic kidney disease, or unspecified chronic kidney disease: Secondary | ICD-10-CM | POA: Diagnosis not present

## 2021-05-31 DIAGNOSIS — Z993 Dependence on wheelchair: Secondary | ICD-10-CM | POA: Diagnosis not present

## 2021-05-31 DIAGNOSIS — E785 Hyperlipidemia, unspecified: Secondary | ICD-10-CM | POA: Diagnosis present

## 2021-05-31 DIAGNOSIS — Z820 Family history of epilepsy and other diseases of the nervous system: Secondary | ICD-10-CM

## 2021-05-31 DIAGNOSIS — Z79899 Other long term (current) drug therapy: Secondary | ICD-10-CM

## 2021-05-31 DIAGNOSIS — S81802D Unspecified open wound, left lower leg, subsequent encounter: Secondary | ICD-10-CM | POA: Diagnosis not present

## 2021-05-31 DIAGNOSIS — I69319 Unspecified symptoms and signs involving cognitive functions following cerebral infarction: Secondary | ICD-10-CM | POA: Diagnosis not present

## 2021-05-31 DIAGNOSIS — N189 Chronic kidney disease, unspecified: Secondary | ICD-10-CM | POA: Diagnosis not present

## 2021-05-31 DIAGNOSIS — N401 Enlarged prostate with lower urinary tract symptoms: Secondary | ICD-10-CM | POA: Diagnosis present

## 2021-05-31 DIAGNOSIS — I639 Cerebral infarction, unspecified: Secondary | ICD-10-CM | POA: Diagnosis not present

## 2021-05-31 DIAGNOSIS — R338 Other retention of urine: Secondary | ICD-10-CM | POA: Diagnosis not present

## 2021-05-31 DIAGNOSIS — Z91030 Bee allergy status: Secondary | ICD-10-CM

## 2021-05-31 DIAGNOSIS — K219 Gastro-esophageal reflux disease without esophagitis: Secondary | ICD-10-CM | POA: Diagnosis present

## 2021-05-31 DIAGNOSIS — N3941 Urge incontinence: Secondary | ICD-10-CM | POA: Diagnosis not present

## 2021-05-31 MED ORDER — ENSURE ENLIVE PO LIQD: 237.0000 mL | Freq: Two times a day (BID) | ORAL | 12 refills | Status: DC

## 2021-05-31 MED ORDER — APIXABAN 2.5 MG PO TABS
2.5000 mg | ORAL_TABLET | Freq: Two times a day (BID) | ORAL | Status: DC
  Administered 2021-05-31 – 2021-06-14 (×28): 2.5 mg via ORAL
  Filled 2021-05-31 (×28): qty 1

## 2021-05-31 MED ORDER — TRAMADOL HCL 50 MG PO TABS: 25.0000 mg | ORAL_TABLET | Freq: Four times a day (QID) | ORAL | Status: DC | PRN

## 2021-05-31 MED ORDER — LIDOCAINE 5 % EX PTCH: 1.0000 | MEDICATED_PATCH | CUTANEOUS | 0 refills | Status: DC

## 2021-05-31 MED ORDER — ACETAMINOPHEN 650 MG RE SUPP: 650.0000 mg | RECTAL | Status: DC | PRN

## 2021-05-31 MED ORDER — NITROGLYCERIN 0.4 MG SL SUBL: 0.4000 mg | SUBLINGUAL_TABLET | SUBLINGUAL | Status: DC | PRN

## 2021-05-31 MED ORDER — GABAPENTIN 100 MG PO CAPS
200.0000 mg | ORAL_CAPSULE | Freq: Two times a day (BID) | ORAL | Status: DC
  Administered 2021-05-31 – 2021-06-14 (×28): 200 mg via ORAL
  Filled 2021-05-31 (×28): qty 2

## 2021-05-31 MED ORDER — ACETAMINOPHEN 160 MG/5ML PO SOLN: 650.0000 mg | ORAL | Status: DC | PRN

## 2021-05-31 MED ORDER — ISOSORBIDE MONONITRATE ER 30 MG PO TB24
60.0000 mg | ORAL_TABLET | Freq: Every day | ORAL | Status: DC
  Administered 2021-05-31 – 2021-06-13 (×14): 60 mg via ORAL
  Filled 2021-05-31 (×14): qty 2

## 2021-05-31 MED ORDER — FINASTERIDE 5 MG PO TABS
5.0000 mg | ORAL_TABLET | Freq: Every day | ORAL | Status: DC
  Administered 2021-06-01 – 2021-06-14 (×14): 5 mg via ORAL
  Filled 2021-05-31 (×14): qty 1

## 2021-05-31 MED ORDER — PANTOPRAZOLE SODIUM 40 MG PO TBEC
40.0000 mg | DELAYED_RELEASE_TABLET | ORAL | Status: DC
  Administered 2021-06-02 – 2021-06-14 (×7): 40 mg via ORAL
  Filled 2021-05-31 (×6): qty 1

## 2021-05-31 MED ORDER — SENNOSIDES-DOCUSATE SODIUM 8.6-50 MG PO TABS
1.0000 | ORAL_TABLET | Freq: Every evening | ORAL | Status: DC | PRN
  Administered 2021-05-31 – 2021-06-11 (×4): 1 via ORAL
  Filled 2021-05-31 (×5): qty 1

## 2021-05-31 MED ORDER — ACETAMINOPHEN 325 MG PO TABS
650.0000 mg | ORAL_TABLET | ORAL | Status: DC | PRN
  Administered 2021-06-06 – 2021-06-14 (×6): 650 mg via ORAL
  Filled 2021-05-31 (×6): qty 2

## 2021-05-31 MED ORDER — GABAPENTIN 100 MG PO CAPS
100.0000 mg | ORAL_CAPSULE | Freq: Every evening | ORAL | Status: DC
  Administered 2021-05-31 – 2021-06-13 (×14): 100 mg via ORAL
  Filled 2021-05-31 (×14): qty 1

## 2021-05-31 MED ORDER — ATORVASTATIN CALCIUM 40 MG PO TABS
40.0000 mg | ORAL_TABLET | Freq: Every day | ORAL | Status: DC
Start: 2021-06-01 — End: 2021-06-13

## 2021-05-31 MED ORDER — ATORVASTATIN CALCIUM 40 MG PO TABS
40.0000 mg | ORAL_TABLET | Freq: Every day | ORAL | Status: DC
  Administered 2021-06-01 – 2021-06-14 (×14): 40 mg via ORAL
  Filled 2021-05-31 (×14): qty 1

## 2021-05-31 MED ORDER — ENSURE ENLIVE PO LIQD
237.0000 mL | Freq: Two times a day (BID) | ORAL | Status: DC
  Administered 2021-06-01 – 2021-06-14 (×22): 237 mL via ORAL

## 2021-05-31 MED ORDER — APIXABAN 2.5 MG PO TABS: 2.5000 mg | ORAL_TABLET | Freq: Two times a day (BID) | ORAL | Status: DC

## 2021-05-31 MED ORDER — GALANTAMINE HYDROBROMIDE ER 8 MG PO CP24
8.0000 mg | ORAL_CAPSULE | Freq: Every day | ORAL | Status: DC
  Administered 2021-06-01 – 2021-06-14 (×14): 8 mg via ORAL
  Filled 2021-05-31 (×14): qty 1

## 2021-05-31 MED ORDER — BLOOD PRESSURE CONTROL BOOK
Freq: Once | Status: AC
Start: 2021-05-31 — End: 2021-05-31
  Filled 2021-05-31: qty 1

## 2021-05-31 MED ORDER — EZETIMIBE 10 MG PO TABS
10.0000 mg | ORAL_TABLET | Freq: Every day | ORAL | Status: DC
  Administered 2021-06-01 – 2021-06-14 (×14): 10 mg via ORAL
  Filled 2021-05-31 (×14): qty 1

## 2021-05-31 MED ORDER — LEFLUNOMIDE 20 MG PO TABS
10.0000 mg | ORAL_TABLET | Freq: Every day | ORAL | Status: DC
  Administered 2021-06-01 – 2021-06-14 (×14): 10 mg via ORAL
  Filled 2021-05-31 (×14): qty 0.5

## 2021-05-31 MED ORDER — AMIODARONE HCL 200 MG PO TABS
200.0000 mg | ORAL_TABLET | Freq: Every day | ORAL | Status: DC
  Administered 2021-06-01 – 2021-06-14 (×14): 200 mg via ORAL
  Filled 2021-05-31 (×14): qty 1

## 2021-05-31 MED ORDER — TRAMADOL HCL 50 MG PO TABS
25.0000 mg | ORAL_TABLET | Freq: Four times a day (QID) | ORAL | Status: DC | PRN
  Administered 2021-06-02 – 2021-06-13 (×12): 25 mg via ORAL
  Filled 2021-05-31 (×13): qty 1

## 2021-05-31 MED ORDER — MEMANTINE HCL 10 MG PO TABS
10.0000 mg | ORAL_TABLET | Freq: Two times a day (BID) | ORAL | Status: DC
  Administered 2021-05-31 – 2021-06-14 (×28): 10 mg via ORAL
  Filled 2021-05-31 (×28): qty 1

## 2021-05-31 MED ORDER — ADULT MULTIVITAMIN W/MINERALS CH
1.0000 | ORAL_TABLET | Freq: Every day | ORAL | Status: DC
  Administered 2021-06-01 – 2021-06-14 (×14): 1 via ORAL
  Filled 2021-05-31 (×14): qty 1

## 2021-05-31 MED ORDER — PREDNISONE 1 MG PO TABS
3.0000 mg | ORAL_TABLET | Freq: Every day | ORAL | Status: DC
  Administered 2021-06-01 – 2021-06-14 (×14): 3 mg via ORAL
  Filled 2021-05-31 (×14): qty 3

## 2021-05-31 MED ORDER — TORSEMIDE 20 MG PO TABS
40.0000 mg | ORAL_TABLET | Freq: Every day | ORAL | Status: DC
  Administered 2021-06-01: 40 mg via ORAL
  Filled 2021-05-31: qty 2

## 2021-05-31 MED ORDER — LIDOCAINE 5 % EX PTCH
1.0000 | MEDICATED_PATCH | CUTANEOUS | Status: DC
  Administered 2021-06-01 – 2021-06-14 (×13): 1 via TRANSDERMAL
  Filled 2021-05-31 (×14): qty 1

## 2021-05-31 NOTE — Progress Notes (Addendum)
PMR Admission Coordinator Pre-Admission Assessment   Patient: [name] [name] is an 88 y.o., male MRN: [id number] DOB: Aug 19, 1926 Height: 6' (182.9 cm) Weight: 100.6 kg   Insurance Information HMO:   yes   PPO:      PCP:      IPA:      80/20:      OTHER:  PRIMARY: BCBS Medicare       Policy#: [id number]      Subscriber: pt CM Name: [name] [name]       Phone#: [phone]     Fax#: [phone] Pre-Cert#: [id number]      Employer: [name] [name] with Blue Medicare approved  5/31-6/7  Benefits:  Phone #: [phone]     Name:  [name] Date: 01/01/2021 - still active Deductible: does not have one OOP Max: $3,500 ($2,048.12 met) CIR: $250/day co-pay for days 1-6 SNF: $0.00 Copayment per day for days 1-20; $196 Copayment per day for days 21-60; $0.00 copayment for days 61-100 Maximum of 100 days/benefit period Outpatient:  90% coverage, 10% co-insurance Home Health:  100% coverage DME: 80% coverage, 20% co-insurance Providers: in network    SECONDARY:       Policy#:      Phone#:    Development worker, community:       Phone#:    The Engineer, petroleum" for patients in Inpatient Rehabilitation Facilities with attached "Privacy Act [place] Records" was provided and verbally reviewed with: Patient and Family   Emergency Contact Information Contact Information       Name Relation Home Work Mobile    [name] [name] Spouse [phone]   [phone]    [name] [name] Daughter     [phone]           Current Medical History  Patient Admitting Diagnosis: CVA  History of Present Illness: HPI: [name] [name] is a 88 year old right-handed male with history of AAA, CAD with CABG, compression fractures, chronic anemia followed by hematology services, chronic systolic congestive heart failure, A-fib/AF status post MAZE on Xarelto, hypertension, hyperlipidemia, PMR maintained on steroids, prediabetes, early dementia maintained on Namenda, chronic lower extremity wounds with Unna boot OSA  with CPAP, PAD with lymphedema status post balloon angioplasty of posterior tibial artery October 2022 recent UTI placed on antibiotic therapy.  Per chart review lives with spouse.  He does have a personal care attendant Monday Wednesday Friday for 3 hours.  Modified independent transfers power wheelchair bound x3 to 4 months prior to that ambulating short distances with a rollator.  Present 05/25/2021 with right-sided weakness and aphasia of acute onset.  Cranial CT scan showed small left MCA territory cortical infarct within the mid to posterior left frontal lobe.  CT angiogram head and neck no large vessel occlusion.  Multifocal severe stenosis within the right vertebral artery at the level of the skull base and intracranially.  MRI identified small areas of acute infarct right occipital lobe left frontal lobe over the convexity.  Mild to moderate acute infarct left operculum.  Possible cerebral emboli.  Patient did not receive tPA.  Echocardiogram ejection fraction of 65 to 70% no wall motion abnormalities.  Admission chemistries unremarkable except BUN 26 creatinine 1.71, hemoglobin 9.9, hemoglobin A1c 5.8, troponin 29-32.  Neurology follow-up currently maintained on Eliquis patient had been on Xarelto prior to admission for atrial fibrillation.  He completed a course of Omnicef for diagnosed UTI prior to hospitalization.  Tolerating regular diet.  Therapy evaluations completed due to patient's right-sided weakness and aphasia was admitted for a comprehensive rehab  program.   Complete NIHSS TOTAL: 2   Patient's medical record [facility]  has been reviewed by the rehabilitation admission coordinator and physician.   Past Medical History      Past Medical History:  Diagnosis Date   AAA (abdominal aortic aneurysm) (HCC)     Arthritis     Ascending aorta dilatation ([place]) 03/13/2012    Fusiform dilatation of the ascending thoracic aorta discovered during surgery, max diameter  4.2-4.3 cm   BPH (benign prostatic hypertrophy)     Chronic systolic heart failure ([place]) 03/03/2012   Chronic venous insufficiency     GERD (gastroesophageal reflux disease)     Glaucoma     Gout     History of kidney stones     Hyperlipidemia     Hypertension     Obesity (BMI 30-39.9)     Paroxysmal atrial fibrillation (HCC) 02/29/2012    Recurrent paroxysmal, new-onset    PMR (polymyalgia rheumatica) (HCC)     Polymyalgia rheumatica ([place]) 02/06/2019   Pre-diabetes     S/P CABG x 3 03/06/2012    LIMA to LAD, SVG to OM, SVG to RCA, EVH via right thigh   S/P Maze operation for atrial fibrillation 03/06/2012    Complete bilateral lesions set using bipolar radiofrequency and cryothermy ablation with clipping of LA appendage   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    s/p TAVR with a 26 mm Edwards via the left subclavian by Drs Buena Irish and Bartle   Severe aortic stenosis 09/29/2019   Sleep apnea      USES CPAP NIGHTLY      Has the patient had major surgery during 100 days prior to admission? Yes   Family History   family history includes Dementia in his mother; Stroke in his brother.   Current Medications   Current Facility-Administered Medications:    acetaminophen (TYLENOL) tablet 650 mg, 650 mg, Oral, Q4H PRN, 650 mg at 05/27/21 1130 **OR** acetaminophen (TYLENOL) 160 MG/5ML solution 650 mg, 650 mg, Per Tube, Q4H PRN **OR** acetaminophen (TYLENOL) suppository 650 mg, 650 mg, Rectal, Q4H PRN, Karmen Bongo, MD   amiodarone (PACERONE) tablet 200 mg, 200 mg, Oral, Daily, Karmen Bongo, MD, 200 mg at 05/28/21 0850   apixaban (ELIQUIS) tablet 2.5 mg, 2.5 mg, Oral, BID, Ghimire, Shanker M, MD, 2.5 mg at 05/28/21 5277   aspirin chewable tablet 81 mg, 81 mg, Oral, Daily, Karmen Bongo, MD, 81 mg at 05/28/21 0851   atorvastatin (LIPITOR) tablet 40 mg, 40 mg, Oral, Daily, Karmen Bongo, MD, 40 mg at 05/28/21 8242   cefdinir (OMNICEF) capsule 300 mg, 300 mg,  Oral, BID, Ghimire, Shanker M, MD, 300 mg at 05/28/21 3536   ezetimibe (ZETIA) tablet 10 mg, 10 mg, Oral, Daily, Karmen Bongo, MD, 10 mg at 05/28/21 1443   feeding supplement (ENSURE ENLIVE / ENSURE PLUS) liquid 237 mL, 237 mL, Oral, BID BM, Ghimire, Shanker M, MD, 237 mL at 05/28/21 0853   finasteride (PROSCAR) tablet 5 mg, 5 mg, Oral, Daily, Karmen Bongo, MD, 5 mg at 05/28/21 0851   gabapentin (NEURONTIN) capsule 200 mg, 200 mg, Oral, BID, 200 mg at 05/28/21 0851 **AND** gabapentin (NEURONTIN) capsule 100 mg, 100 mg, Oral, QPM, Karmen Bongo, MD, 100 mg at 05/27/21 1547   galantamine (RAZADYNE ER) 24 hr capsule 8 mg, 8 mg, Oral, Q breakfast, Karmen Bongo, MD, 8 mg at 05/28/21 1540   hydrALAZINE (APRESOLINE) injection 5 mg, 5 mg, Intravenous, Q4H PRN, Lorin Mercy,  Anderson Malta, MD   isosorbide mononitrate (IMDUR) 24 hr tablet 60 mg, 60 mg, Oral, QPC supper, Karmen Bongo, MD, 60 mg at 05/27/21 1703   leflunomide (ARAVA) tablet 10 mg, 10 mg, Oral, Daily, Karmen Bongo, MD, 10 mg at 05/28/21 0850   memantine (NAMENDA) tablet 10 mg, 10 mg, Oral, BID, Karmen Bongo, MD, 10 mg at 05/28/21 6387   multivitamin with minerals tablet 1 tablet, 1 tablet, Oral, Daily, Ghimire, Henreitta Leber, MD, 1 tablet at 05/28/21 0851   nitroGLYCERIN (NITROSTAT) SL tablet 0.4 mg, 0.4 mg, Sublingual, Q5 min PRN, Jonetta Osgood, MD, 0.4 mg at 05/27/21 0853   pantoprazole (PROTONIX) EC tablet 40 mg, 40 mg, Oral, Cathlean Sauer, MD, 40 mg at 05/27/21 5643   predniSONE (DELTASONE) tablet 5 mg, 5 mg, Oral, Q breakfast, Karmen Bongo, MD, 5 mg at 05/28/21 3295   senna-docusate (Senokot-S) tablet 1 tablet, 1 tablet, Oral, QHS PRN, Karmen Bongo, MD   torsemide [facility]) tablet 40 mg, 40 mg, Oral, Daily, Ghimire, Henreitta Leber, MD, 40 mg at 05/28/21 1884   Patients Current Diet:  Diet Order                  DIET DYS 3 Room service appropriate? Yes; Fluid consistency: Thin  Diet effective now                          Precautions / Restrictions Precautions Precautions: Fall Restrictions Weight Bearing Restrictions: No    Has the patient had 2 or more falls or a fall with injury in the past year? Yes   Prior Activity Level Limited Community (1-2x/wk): Pt. went out mostly for appts   Prior Functional Level Self Care: Did the patient need help bathing, dressing, using the toilet or eating? Dependent   Indoor Mobility: Did the patient need assistance with walking from room to room (with or without device)? Needed some help   Stairs: Did the patient need assistance with internal or external stairs (with or without device)? Dependent   Functional Cognition: Did the patient need help planning regular tasks such as shopping or remembering to take medications? Needed some help   Patient Information Are you of Hispanic, Latino/a,or Spanish origin?: A. No, not of Hispanic, Latino/a, or Spanish origin What is your race?: A. White Do you need or want an interpreter to communicate with a doctor or health care staff?: 0. No   Patient's Response To:  Health Literacy and Transportation Is the patient able to respond to health literacy and transportation needs?: Yes Health Literacy - How often do you need to have someone help you when you read instructions, pamphlets, or other written material from your doctor or pharmacy?: Never In the past 12 months, has lack of transportation kept you from medical appointments or from getting medications?: Yes In the past 12 months, has lack of transportation kept you from meetings, work, or from getting things needed for daily living?: Yes   [place] / [place] Devices/Equipment: None Home Equipment: Rollator (4 wheels), Conservation officer, nature (2 wheels), Wheelchair - manual, Grab bars - tub/shower, Civil engineer, contracting, Wheelchair - power   Prior Device Use: Indicate devices/aids used by the patient prior to current illness, exacerbation or injury?  Motorized wheelchair or scooter and Location manager   Overall Cognitive Status: Impaired/Different from baseline Difficult to assess due to: Impaired communication Orientation Level: Oriented X4 Following Commands: Follows one step commands  with increased time General Comments: pt did not recall OT and PT eval yesterday, cues needed to use his L hand for oral care when R was ineffective    Extremity Assessment (includes Sensation/Coordination)   Upper Extremity Assessment: RUE deficits/detail RUE Deficits / Details: 4+/5 RUE Coordination: decreased fine motor  Lower Extremity Assessment: Defer to PT evaluation     ADLs   Overall ADL's : Needs assistance/impaired Eating/Feeding: Minimal assistance, Bed level Eating/Feeding Details (indicate cue type and reason): cued pt to use L hand for hot drink, increased spillage with R hand Grooming: Oral care, Sitting, Minimal assistance Grooming Details (indicate cue type and reason): Pt without awareness when toothbrush turned in R hand and became ineffective, switched to L hand with verbal cues Upper Body Bathing: Total assistance, Sitting Lower Body Bathing: +2 for physical assistance, Total assistance, Sit to/from stand Upper Body Dressing : Moderate assistance, Sitting Lower Body Dressing: Total assistance, +2 for physical assistance, Sit to/from stand Toileting- Clothing Manipulation and Hygiene: +2 for physical assistance, Total assistance, Sit to/from stand Toileting - Clothing Manipulation Details (indicate cue type and reason): for posterior pericare General ADL Comments: Pt less reliant on B UE support in sitting, able to complete grooming at EOB with one hand support     Mobility   Overal bed mobility: Needs Assistance Bed Mobility: Rolling, Sidelying to Sit, Sit to Sidelying Rolling: Min assist Sidelying to sit: Mod assist Supine to sit: HOB elevated, Max assist Sit to sidelying: Mod assist General  bed mobility comments: cues for log roll technique, increased time, assist for LEs over EOB, to raise trunk and for LEs back into bed, pt able to scoot hips to EOB without assist     Transfers   Overall transfer level: Needs assistance Equipment used: Rolling walker (2 wheels) Transfers: Sit to/from Stand Sit to Stand: +2 physical assistance, Min assist General transfer comment: heavy +2 min assist to stand from elevated bed allowing pt to pull up on walker, pt able to take side steps along EOB to reposition with +2 mod assist and RW     Ambulation / Gait / Stairs / Wheelchair Mobility   Ambulation/Gait General Gait Details: unable     Posture / Balance Dynamic Sitting Balance Sitting balance - Comments: one LOB posterior, pt requires at least one hand on bed to offload back pain, but prefers 2 Balance Overall balance assessment: Needs assistance Sitting-balance support: Feet supported, Single extremity supported Sitting balance-Leahy Scale: Poor Sitting balance - Comments: one LOB posterior, pt requires at least one hand on bed to offload back pain, but prefers 2 Standing balance support: Bilateral upper extremity supported, Reliant on assistive device for balance Standing balance-Leahy Scale: Poor Standing balance comment: +2 min assist with posterior lean and pt stabilizing LEs on bed     Special needs/care consideration Skin LE woud  and Special service needs uses w/c at baseline    Previous Home Environment (from acute therapy documentation) Living Arrangements: Spouse/significant other  Lives With: Spouse Available Help at Discharge: Family, Available 24 hours/day, Personal care attendant Type of Home: House Home Layout: Able to live on main level with bedroom/bathroom Home Access: Elevator Bathroom Shower/Tub: Multimedia programmer: Handicapped height Bathroom Accessibility: Yes How Accessible: Accessible via wheelchair, Accessible via walker Home Care Services:  No   Discharge Living Setting Plans for Discharge Living Setting: Patient's home Type of Home at Discharge: House Discharge Home Layout: Able to live on main level with bedroom/bathroom Discharge Home Access:  Elevator Discharge Bathroom Shower/Tub: Walk-in shower Discharge Bathroom Toilet: Handicapped height Discharge Bathroom Accessibility: Yes How Accessible: Accessible via walker, Accessible via wheelchair Does the patient have any problems obtaining your medications?: No   Social/Family/Support Systems Patient Roles: Spouse Contact Information: [phone] Anticipated Caregiver: Rogue Jury Anticipated Caregiver's Contact Information: Pt. also has caregivers through LTC policy and can increase Ability/Limitations of Caregiver: 24/7 min-mod A Caregiver Availability: 24/7 Discharge Plan Discussed with Primary Caregiver: Yes Is Caregiver In Agreement with Plan?: Yes Does Caregiver/Family have Issues with Lodging/Transportation while Pt is in Rehab?: Yes   Goals Patient/Family Goal for Rehab: PT/OT Min A wheelchair level; SLP Mod I Expected length of stay: 16-18 days Pt/Family Agrees to Admission and willing to participate: Yes Program Orientation Provided & Reviewed with Pt/Caregiver Including Roles  & Responsibilities: Yes   Decrease burden of Care through IP rehab admission: Specialzed equipment needs, Decrease number of caregivers, Bowel and bladder program, and Patient/family education   Possible need for SNF placement upon discharge: not anticipated   Patient Condition: I have reviewed medical records from [facility], spoken with CM, and patient and spouse. I met with patient at the bedside for inpatient rehabilitation assessment.  Patient will benefit from ongoing PT, OT, and SLP, can actively participate in 3 hours of therapy a day 5 days of the week, and can make measurable gains during the admission.  Patient will also benefit from the coordinated team  approach during an Inpatient Acute Rehabilitation admission.  The patient will receive intensive therapy as well as Rehabilitation physician, nursing, social worker, and care management interventions.  Due to bladder management, bowel management, safety, skin/wound care, disease management, medication administration, pain management, and patient education the patient requires 24 hour a day rehabilitation nursing.  The patient is currently min A with mobility and basic ADLs.  Discharge setting and therapy post discharge at home with home health is anticipated.  Patient has agreed to participate in the Acute Inpatient Rehabilitation Program and will admit today.   Preadmission Screen Completed By:  Genella Mech, 05/28/2021 11:21 AM ______________________________________________________________________   Discussed status with Dr. Curlene Dolphin on 05/31/21 at 66 and received approval for admission today.   Admission Coordinator:  Danise Edge, time 1015/Date 05/31/21    Assessment/Plan: Diagnosis:CVA  Does the need for close, 24 hr/day Medical supervision in concert with the patient's rehab needs make it unreasonable for this patient to be served in a less intensive setting? Yes Co-Morbidities requiring supervision/potential complications: AAA, CAD with CABG, compression fractures, chronic anemia Due to bladder management, bowel management, safety, skin/wound care, disease management, medication administration, pain management, and patient education, does the patient require 24 hr/day rehab nursing? Yes Does the patient require coordinated care of a physician, rehab nurse, PT, OT, and SLP to address physical and functional deficits in the context of the above medical diagnosis(es)? Yes Addressing deficits in the following areas: balance, endurance, locomotion, strength, transferring, bowel/bladder control, bathing, dressing, feeding, grooming, toileting, cognition, speech, language, swallowing,  and psychosocial support Can the patient actively participate in an intensive therapy program of at least 3 hrs of therapy 5 days a week? Yes The potential for patient to make measurable gains while on inpatient rehab is excellent Anticipated functional outcomes upon discharge from inpatient rehab: min assist PT, min assist OT, modified independent SLP Estimated rehab length of stay to reach the above functional goals is: 16-18 Anticipated discharge destination: Home 10. Overall Rehab/Functional Prognosis: excellent     MD  Signature: Jennye Boroughs

## 2021-05-31 NOTE — Progress Notes (Signed)
Inpatient Rehabilitation Admission Medication Review by a Pharmacist  A complete drug regimen review was completed for this patient to identify any potential clinically significant medication issues.  High Risk Drug Classes Is patient taking? Indication by Medication  Antipsychotic No   Anticoagulant Yes Eliquis for PAF  Antibiotic No   Opioid Yes Tramadol prn pain  Antiplatelet No   Hypoglycemics/insulin No   Vasoactive Medication Yes Amiodarone for PAF Imdur, torsemide for CHF  Chemotherapy No   Other Yes Galantamine, memantine for dementia Arava, prednisone for RA Atorvastatin, Zetia for HLD Gabapentin for pain Finasteride for BPH Protonix for GERD     Type of Medication Issue Identified Description of Issue Recommendation(s)  Drug Interaction(s) (clinically significant)     Duplicate Therapy     Allergy     No Medication Administration End Date     Incorrect Dose     Additional Drug Therapy Needed     Significant med changes from prior encounter (inform family/care partners about these prior to discharge). Eliquis replaces Xarelto Hydralazine, Metoprolol XL PTA (to resume if needed for BP)   Other       Clinically significant medication issues were identified that warrant physician communication and completion of prescribed/recommended actions by midnight of the next day:  No  Pharmacist comments: None  Time spent performing this drug regimen review (minutes):  20 minutes   Tad Moore 05/31/2021 1:56 PM

## 2021-05-31 NOTE — Progress Notes (Signed)
Inpatient Rehab Admissions Coordinator:  ° °I have a CIR bed for this Pt. Today. RN may call report to [phone]. ° °Nohemy Koop, MS, CCC-SLP °Rehab Admissions Coordinator  °[phone] (celll) °[phone] (office) ° °

## 2021-05-31 NOTE — Progress Notes (Signed)
Patient has home unit at bedside, fluids are good in his machine and it is ready for use.  Patient self manages his machine.

## 2021-05-31 NOTE — Progress Notes (Signed)
Occupational Therapy Treatment Patient Details Name: [name] [name] MRN: [id number] DOB: 05/20/1926 Today's Date: 05/31/2021   History of present illness Pt is a 88 y.o. male who presented to the ED 5/25 with c/o R side weakness and difficulty speaking. MRI revealed small areas of acute infarction in the right occipital lobe and left frontal lobe over the convexity; mild to moderate acute infarct left operculum; possible cerebral emboli. PMH:  AAA; CAD s/p CABG; chronic systolic CHF; afib s/p Maze, on Xarelto; HTN; HLD; PMR (currently on steroids); pre-diabetes; OSA on CPAP; PAD with lymphedema; spinal compression fx; and AS s/p TAVR   OT comments  Pt continues to make steady progress. Using R hand as lead during grooming with improvement of in-hand manipulation. Sensation remains decreased with pt dropping electric toothbrush as back pain increased in standing at sink and unaware when washcloth not in his hand when washing his face. Pt using B UEs when drinking. Pt able to ambulate with RW and chair follow for safety to and from sink. Continues to demonstrate slow processing, requires increased time to follow commands, cues to self monitor for need to sit.    Recommendations for follow up therapy are one component of a multi-disciplinary discharge planning process, led by the attending physician.  Recommendations may be updated based on patient status, additional functional criteria and insurance authorization.    Follow Up Recommendations  Acute inpatient rehab (3hours/day)    Assistance Recommended at Discharge Frequent or constant Supervision/Assistance  Patient can return home with the following  Two people to help with walking and/or transfers;Two people to help with bathing/dressing/bathroom;Assistance with cooking/housework;Direct supervision/assist for medications management;Direct supervision/assist for financial management;Assist for transportation;Help with stairs or ramp for entrance    Equipment Recommendations  None recommended by OT    Recommendations for Other Services      Precautions / Restrictions Precautions Precautions: Fall Restrictions Weight Bearing Restrictions: No       Mobility Bed Mobility Overal bed mobility: Needs Assistance Bed Mobility: Supine to Sit     Supine to sit: Min guard, HOB elevated     General bed mobility comments: +use of rail, no physical assist    Transfers Overall transfer level: Needs assistance Equipment used: Rolling walker (2 wheels) Transfers: Sit to/from Stand Sit to Stand: +2 physical assistance, Min assist           General transfer comment: from elevated surface, increased time, does not follow verbal cues for hand placement, assist to rise and steady     Balance Overall balance assessment: Needs assistance   Sitting balance-Leahy Scale: Fair     Standing balance support: Single extremity supported, During functional activity Standing balance-Leahy Scale: Poor Standing balance comment: requires one hand to stabilize on sink during oral care, as back pain increased, pt placing B elbows on sink                           ADL either performed or assessed with clinical judgement   ADL Overall ADL's : Needs assistance/impaired Eating/Feeding: Set up;Sitting Eating/Feeding Details (indicate cue type and reason): drinks with B hands on cup Grooming: Oral care;Wash/dry face;Standing;Sitting;Minimal assistance Grooming Details (indicate cue type and reason): pt using R hand on electric tootbrush, able to manipulate it in hand, pt dropping toothbrush as his pain increased in standing, washed face with R hand, unaware when washcloth was not in his hand  Functional mobility during ADLs: Minimal assistance;+2 for safety/equipment;Rolling walker (2 wheels) General ADL Comments: stood x 2 minutes at sink for oral care, completed grooming in sitting     Extremity/Trunk Assessment              Vision       Perception     Praxis      Cognition Arousal/Alertness: Awake/alert Behavior During Therapy: WFL for tasks assessed/performed Overall Cognitive Status: Impaired/Different from baseline Area of Impairment: Memory, Following commands, Problem solving, Safety/judgement, Awareness                     Memory: Decreased short-term memory Following Commands: Follows one step commands with increased time Safety/Judgement: Decreased awareness of safety, Decreased awareness of deficits (cues to monitor need to sit) Awareness: Emergent Problem Solving: Slow processing, Requires verbal cues          Exercises      Shoulder Instructions       General Comments      Pertinent Vitals/ Pain       Pain Assessment Pain Assessment: Faces Faces Pain Scale: Hurts whole lot Pain Location: back Pain Descriptors / Indicators: Grimacing, Discomfort, Moaning Pain Intervention(s): Premedicated before session, Repositioned, Monitored during session, Limited activity within patient's tolerance  Home Living                                          Prior Functioning/Environment              Frequency  Min 3X/week        Progress Toward Goals  OT Goals(current goals can now be found in the care plan section)  Progress towards OT goals: Progressing toward goals  Acute Rehab OT Goals OT Goal Formulation: With patient Time For Goal Achievement: 06/09/21 Potential to Achieve Goals: Good  Plan Discharge plan remains appropriate    Co-evaluation    PT/OT/SLP Co-Evaluation/Treatment: Yes Reason for Co-Treatment: For patient/therapist safety   OT goals addressed during session: ADL's and self-care      AM-PAC OT "6 Clicks" Daily Activity     Outcome Measure   Help from another person eating meals?: A Little Help from another person taking care of personal grooming?: A Little Help from  another person toileting, which includes using toliet, bedpan, or urinal?: Total Help from another person bathing (including washing, rinsing, drying)?: A Lot Help from another person to put on and taking off regular upper body clothing?: A Little Help from another person to put on and taking off regular lower body clothing?: Total 6 Click Score: 13    End of Session Equipment Utilized During Treatment: Gait belt;Rolling walker (2 wheels)  OT Visit Diagnosis: Unsteadiness on feet (R26.81);Other abnormalities of gait and mobility (R26.89);Pain;Muscle weakness (generalized) (M62.81);Hemiplegia and hemiparesis;Other symptoms and signs involving cognitive function Hemiplegia - Right/Left: Right Hemiplegia - dominant/non-dominant: Dominant Hemiplegia - caused by: Cerebral infarction   Activity Tolerance Patient limited by pain   Patient Left in chair;with call bell/phone within reach;with chair alarm set   Nurse Communication          Time: [phone] OT Time Calculation (min): 32 min  Charges: OT General Charges $OT Visit: 1 Visit OT Treatments $Self Care/Home Management : 8-22 mins  Nestor Lewandowsky, OTR/L Acute Rehabilitation Services Pager: [phone] Office: [phone]   Malka So 05/31/2021, 9:43 AM

## 2021-05-31 NOTE — H&P (Signed)
Physical Medicine and Rehabilitation Admission H&P        Chief Complaint  Patient presents with   Code Stroke  : HPI: [name] [name] is a 88 year old right-handed male with history of AAA, CAD with CABG, compression fractures, chronic anemia followed by hematology services, chronic systolic congestive heart failure, A-fib/AF status post MAZE on Xarelto, hypertension, hyperlipidemia, PMR maintained on steroids, prediabetes, early dementia maintained on Namenda, chronic lower extremity wounds with Unna boot OSA with CPAP, PAD with lymphedema status post balloon angioplasty of posterior tibial artery October 2022 recent UTI placed on antibiotic therapy.  Per chart review lives with spouse. She is in the room during history and exam.  He does have a personal care attendant Monday Wednesday Friday for 3 hours.  Modified independent transfers power wheelchair bound x3 to 4 months prior to that ambulating short distances with a rollator.  Present 05/25/2021 with right-sided weakness and aphasia of acute onset.  Cranial CT scan showed small left MCA territory cortical infarct within the mid to posterior left frontal lobe.  CT angiogram head and neck no large vessel occlusion.  Multifocal severe stenosis within the right vertebral artery at the level of the skull base and intracranially.  MRI identified small areas of acute infarct right occipital lobe left frontal lobe over the convexity.  Mild to moderate acute infarct left operculum.  Possible cerebral emboli.  Patient did not receive tPA.  Echocardiogram ejection fraction of 65 to 70% no wall motion abnormalities.  Admission chemistries unremarkable except BUN 26 creatinine 1.71, hemoglobin 9.9, hemoglobin A1c 5.8, troponin 29-32.  Neurology follow-up currently maintained on Eliquis patient had been on Xarelto prior to admission for atrial fibrillation.  He completed a course of Omnicef for diagnosed UTI prior to hospitalization.  Tolerating regular diet.   Therapy evaluations completed due to patient's right-sided weakness and aphasia was admitted for a comprehensive rehab program. He reports chronic pain pain due to compression fracture, followed by orthopaedics.    Review of Systems  Constitutional:  Negative for fever and malaise/fatigue.  HENT:  Negative for hearing loss.   Eyes:  Negative for blurred vision and double vision.  Respiratory:  Positive for shortness of breath. Negative for cough.   Cardiovascular:  Positive for palpitations and leg swelling. Negative for chest pain.  Gastrointestinal:        GERD  Genitourinary:  Positive for urgency. Negative for dysuria, flank pain and hematuria.  Musculoskeletal:  Positive for joint pain and myalgias.  Skin:  Negative for rash.  Neurological:  Positive for speech change and weakness.  All other systems reviewed and are negative.     Past Medical History:  Diagnosis Date   AAA (abdominal aortic aneurysm) (HCC)     Arthritis     Ascending aorta dilatation ([place]) 03/13/2012    Fusiform dilatation of the ascending thoracic aorta discovered during surgery, max diameter 4.2-4.3 cm   BPH (benign prostatic hypertrophy)     Chronic systolic heart failure ([place]) 03/03/2012   Chronic venous insufficiency     GERD (gastroesophageal reflux disease)     Glaucoma     Gout     History of kidney stones     Hyperlipidemia     Hypertension     Obesity (BMI 30-39.9)     Paroxysmal atrial fibrillation (HCC) 02/29/2012    Recurrent paroxysmal, new-onset    PMR (polymyalgia rheumatica) (HCC)     Polymyalgia rheumatica ([place]) 02/06/2019   Pre-diabetes  S/P CABG x 3 03/06/2012    LIMA to LAD, SVG to OM, SVG to RCA, EVH via right thigh   S/P Maze operation for atrial fibrillation 03/06/2012    Complete bilateral lesions set using bipolar radiofrequency and cryothermy ablation with clipping of LA appendage   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    s/p TAVR with a 26 mm  Edwards via the left subclavian by Drs Buena Irish and Bartle   Severe aortic stenosis 09/29/2019   Sleep apnea      USES CPAP NIGHTLY         Past Surgical History:  Procedure Laterality Date   ABDOMINAL AORTOGRAM W/LOWER EXTREMITY N/A 10/26/2020    Procedure: ABDOMINAL AORTOGRAM W/LOWER EXTREMITY;  Surgeon: Broadus John, MD;  Location: [facility];  Service: Cardiovascular;  Laterality: N/A;   CARDIAC CATHETERIZATION       CATARACT EXTRACTION, BILATERAL        with lens implants   CHOLECYSTECTOMY N/A 03/24/2012    Procedure: LAPAROSCOPIC CHOLECYSTECTOMY WITH INTRAOPERATIVE CHOLANGIOGRAM;  Surgeon: Zenovia Jarred, MD;  Location: [facility];  Service: General;  Laterality: N/A;   CORONARY ARTERY BYPASS GRAFT N/A 03/06/2012    Procedure: CORONARY ARTERY BYPASS GRAFTING (CABG);  Surgeon: Rexene Alberts, MD;  Location: [facility];  Service: Open Heart Surgery;  Laterality: N/A;   ENDOVEIN HARVEST OF GREATER SAPHENOUS VEIN Right 03/06/2012    Procedure: ENDOVEIN HARVEST OF GREATER SAPHENOUS VEIN;  Surgeon: Rexene Alberts, MD;  Location: [facility];  Service: Open Heart Surgery;  Laterality: Right;   INTRAOPERATIVE TRANSESOPHAGEAL ECHOCARDIOGRAM N/A 03/06/2012    Procedure: INTRAOPERATIVE TRANSESOPHAGEAL ECHOCARDIOGRAM;  Surgeon: Rexene Alberts, MD;  Location: [facility];  Service: Open Heart Surgery;  Laterality: N/A;   IR KYPHO LUMBAR INC FX REDUCE BONE BX UNI/BIL CANNULATION INC/IMAGING   02/23/2020   LEFT HEART CATH Right 03/02/2012    Procedure: LEFT HEART CATH;  Surgeon: Sherren Mocha, MD;  Location: [facility];  Service: Cardiovascular;  Laterality: Right;   MAZE N/A 03/06/2012    Procedure: MAZE;  Surgeon: Rexene Alberts, MD;  Location: [facility];  Service: Open Heart Surgery;  Laterality: N/A;   PERIPHERAL VASCULAR INTERVENTION Right 10/26/2020    Procedure: PERIPHERAL VASCULAR INTERVENTION;  Surgeon: Broadus John, MD;  Location: [facility];  Service: Cardiovascular;  Laterality: Right;    PROSTATECTOMY        partial   RIGHT/LEFT HEART CATH AND CORONARY/GRAFT ANGIOGRAPHY N/A 09/29/2019    Procedure: RIGHT/LEFT HEART CATH AND CORONARY/GRAFT ANGIOGRAPHY;  Surgeon: Adrian Prows, MD;  Location: [facility];  Service: Cardiovascular;  Laterality: N/A;   TEE WITHOUT CARDIOVERSION N/A 11/10/2019    Procedure: TRANSESOPHAGEAL ECHOCARDIOGRAM (TEE);  Surgeon: Burnell Blanks, MD;  Location: [facility];  Service: Open Heart Surgery;  Laterality: N/A;   TRANSCATHETER AORTIC VALVE REPLACEMENT, TRANSFEMORAL   11/10/2019         Family History  Problem Relation Age of Onset   Dementia Mother     Stroke Brother      Social History:  reports that he quit smoking about 58 years ago. His smoking use included cigarettes. He has a 7.50 pack-year smoking history. He has never been exposed to tobacco smoke. He has never used smokeless tobacco. He reports current alcohol use of about 2.0 standard drinks per week. He reports that he does not use drugs. Allergies:       Allergies  Allergen Reactions  Amoxicillin Other (See Comments)      Headache, debilitated   Bee Venom Anaphylaxis   Avelox [Moxifloxacin] Other (See Comments)      Unknown   Codeine Other (See Comments)   Duloxetine Hcl Other (See Comments)      felt weird   Levitra [Vardenafil] Other (See Comments)      Unknown   Morphine And Related Other (See Comments)      hypotension   Penicillin G Sodium Other (See Comments)      Severe headache, fatigue "debilitated"   Testosterone Other (See Comments)      unknown   Tizanidine Other (See Comments)      unknown   Viagra [Sildenafil] Other (See Comments)      didn't like it          Medications Prior to Admission  Medication Sig Dispense Refill   acetaminophen (TYLENOL) 500 MG tablet Take 2 tablets (1,000 mg total) by mouth 3 (three) times daily. For 3 to 5 days and then change to 1000 mg 3 times daily as needed for pain. (Patient taking differently: Take 500 mg by mouth  as needed for mild pain.)       amiodarone (PACERONE) 200 MG tablet Take 1 tablet (200 mg total) by mouth daily. 90 tablet 3   Ascorbic Acid (VITAMIN C) 500 MG CAPS Take 500 mg by mouth daily.       cadexomer iodine (IODOSORB) 0.9 % gel Apply 1 application. topically every other day as needed for wound care.       cefdinir (OMNICEF) 300 MG capsule Take 300 mg by mouth 2 (two) times daily. Started on 05-23-21 7 DS       cholecalciferol (VITAMIN D3) 25 MCG (1000 UNIT) tablet Take 1,000 Units by mouth daily.        Cyanocobalamin (VITAMIN B-12) 5000 MCG TBDP Take 5,000 mcg by mouth daily.       denosumab (PROLIA) 60 MG/ML SOSY injection Inject 60 mg into the skin every 6 (six) months.       docusate sodium (COLACE) 100 MG capsule Take 1 capsule (100 mg total) by mouth 2 (two) times daily. (Patient taking differently: Take 100 mg by mouth every evening.)       EPINEPHrine 0.3 mg/0.3 mL IJ SOAJ injection Inject 0.3 mLs (0.3 mg total) into the muscle as needed for anaphylaxis. (Patient taking differently: Inject 0.3 mg into the muscle once as needed for anaphylaxis.) 2 each 2   ezetimibe (ZETIA) 10 MG tablet Take 10 mg by mouth daily.       finasteride (PROSCAR) 5 MG tablet Take 5 mg by mouth daily.       fish oil-omega-3 fatty acids 1000 MG capsule Take 1 g by mouth daily.        gabapentin (NEURONTIN) 100 MG capsule Take 100-200 mg by mouth See admin instructions. '200mg'$  in the morning, '100mg'$  in the evening, and '200mg'$  at bedtime       galantamine (RAZADYNE ER) 8 MG 24 hr capsule Take 8 mg by mouth Daily.       hydrALAZINE (APRESOLINE) 25 MG tablet Take 25 mg by mouth 2 (two) times daily.       HYDROcodone-acetaminophen (NORCO/VICODIN) 5-325 MG tablet Take 1 tablet by mouth every 6 (six) hours as needed for moderate pain.       isosorbide mononitrate (IMDUR) 60 MG 24 hr tablet TAKE 1 TABLET(60 MG) BY MOUTH DAILY AFTER SUPPER (Patient taking differently: 60 mg  daily.) 90 tablet 1   leflunomide (ARAVA) 20  MG tablet Take 10 mg by mouth daily.       Magnesium 250 MG TABS Take 250 mg by mouth daily.       memantine (NAMENDA) 10 MG tablet Take 10 mg by mouth 2 (two) times daily.       metoprolol succinate (TOPROL-XL) 25 MG 24 hr tablet Take 0.5 tablets (12.5 mg total) by mouth daily as needed (for high heart rate, consistently greater than 110/min.). (Patient taking differently: Take 12.5 mg by mouth daily.) 90 tablet 1   naftifine (NAFTIN) 1 % cream Apply 1 application. topically daily as needed (fungal skin infection).       nitroGLYCERIN (NITROSTAT) 0.4 MG SL tablet Place 1 tablet (0.4 mg total) under the tongue every 5 (five) minutes x 3 doses as needed for chest pain. 30 tablet 3   pantoprazole (PROTONIX) 40 MG tablet Take 40 mg by mouth every other day.    4   potassium chloride (MICRO-K) 10 MEQ CR capsule TAKE ONE CAPSULE BY MOUTH EVERY DAY WITH LASIX (Patient taking differently: Take 10 mEq by mouth daily.) 90 capsule 1   predniSONE (DELTASONE) 1 MG tablet Take 3 mg by mouth daily with breakfast. Decrease by 1 mg every month until off       Psyllium Husk POWD Take 1 Scoop by mouth daily.        Rivaroxaban (XARELTO) 15 MG TABS tablet Take 15 mg by mouth daily with supper.       torsemide (DEMADEX) 20 MG tablet Take 2 tablets (40 mg total) by mouth daily. 180 tablet 1          Home: Home Living Family/patient expects to be discharged to:: Private residence Living Arrangements: Spouse/significant other Available Help at Discharge: Family, Available 24 hours/day, Personal care attendant Type of Home: House Home Access: Elevator Home Layout: Able to live on main level with bedroom/bathroom Bathroom Shower/Tub: Multimedia programmer: Handicapped height Bathroom Accessibility: Yes Home Equipment: Rollator (4 wheels), Conservation officer, nature (2 wheels), Wheelchair - manual, Grab bars - tub/shower, Civil engineer, contracting, Wheelchair - power  Lives With: Spouse   Functional History: Prior  Function Prior Level of Function : Needs assist Mobility Comments: Mod I transfers. power w/c bound x 3-4 months. Prior to that ambulated short distances with rollator. ADLs Comments: PCA MWF for 3 hours, primarily to assist with bathing/shower, likely assisted for LB dressing and all IADLs   Functional Status:  Mobility: Bed Mobility Overal bed mobility: Needs Assistance Bed Mobility: Rolling, Sidelying to Sit, Sit to Sidelying Rolling: Min assist Sidelying to sit: Min assist Supine to sit: HOB elevated, Max assist Sit to sidelying: Mod assist General bed mobility comments: cues for log roll technique, increased time and use of rail; assist to raise torso; return assist to lift legs Transfers Overall transfer level: Needs assistance Equipment used: Rolling walker (2 wheels) Transfers: Sit to/from Stand, Bed to chair/wheelchair/BSC Sit to Stand: Min assist, Mod assist Bed to/from chair/wheelchair/BSC transfer type:: Step pivot Step pivot transfers: Min assist, Mod assist General transfer comment: Min assist from elevated bed and elevated recliner x 3 reps; last 3 reps from recliner with mod assist; pivot back to bed due to orthostasis with mod assist with RUE slipping off handle of RW x 2 Ambulation/Gait Ambulation/Gait assistance: Min assist Gait Distance (Feet): 6 Feet (3 fwd, 3 backward) Assistive device: Rolling walker (2 wheels) Gait Pattern/deviations: Step-to pattern, Decreased stride length, Trunk flexed  General Gait Details: pt reports he can walk up to 30 ft, but then wife states they always follow with w/c; after 3 feet pt felt he needed to return to chair Gait velocity interpretation: <1.8 ft/sec, indicate of risk for recurrent falls Pre-gait activities: pivotal steps from bed to chair with good foot clearance   ADL: ADL Overall ADL's : Needs assistance/impaired Eating/Feeding: Minimal assistance, Bed level Eating/Feeding Details (indicate cue type and reason): issued  foam build up for eating utensils Grooming: Oral care, Sitting, Minimal assistance Grooming Details (indicate cue type and reason): used R hand with built up handle on toothbrush Upper Body Bathing: Total assistance, Sitting Lower Body Bathing: +2 for physical assistance, Total assistance, Sit to/from stand Upper Body Dressing : Moderate assistance, Sitting Lower Body Dressing: Total assistance, Bed level Lower Body Dressing Details (indicate cue type and reason): for socks Toileting- Clothing Manipulation and Hygiene: +2 for physical assistance, Total assistance, Sit to/from stand Toileting - Clothing Manipulation Details (indicate cue type and reason): for posterior pericare General ADL Comments: Pt less reliant on B UE support in sitting, able to complete grooming at EOB with one hand support   Cognition: Cognition Overall Cognitive Status: Impaired/Different from baseline Arousal/Alertness: Awake/alert Orientation Level: Oriented X4 Year: 2023 Month: May Day of Week: Correct Awareness: Appears intact Problem Solving: Appears intact Safety/Judgment: Appears intact Cognition Arousal/Alertness: Awake/alert Behavior During Therapy: WFL for tasks assessed/performed Overall Cognitive Status: Impaired/Different from baseline Area of Impairment: Memory, Problem solving, Following commands, Awareness Memory: Decreased short-term memory Following Commands: Follows one step commands with increased time Awareness: Emergent Problem Solving: Slow processing, Requires verbal cues General Comments: pt did not recall OT and PT eval yesterday, cues needed to use his L hand for oral care when R was ineffective Difficult to assess due to: Impaired communication   Physical Exam: Blood pressure 103/63, pulse 64, temperature 97.9 F (36.6 C), temperature source Oral, resp. rate 15, height 6' (1.829 m), weight 100.6 kg, SpO2 92 %. Physical Exam Neurological:     Comments: Patient is alert.  Makes  eye contact with examiner.  Provides name with some delay in processing he can provide the year with +/- self correction.  Patient does appear to be apraxic    General: Alert and oriented x 3, No apparent distress, lying in bed HEENT: Head is normocephalic, atraumatic, PERRLA, EOMI, sclera anicteric, oral mucosa pink and moist, dentition intact,  Neck: Supple without JVD or lymphadenopathy Heart: Reg rate and rhythm. No murmurs rubs or gallops Chest: CTA bilaterally without wheezes, rales, or rhonchi; no distress Abdomen: Soft, mildly tender, non-distended, bowel sounds positive. Extremities: No clubbing, cyanosis, or edema.  He has small wounds on his right thigh, dorsal foot covered in dressing, R heel wound,  he has a wrapped clean wound on his left lower extremity from a Mohs surgery, bruising on dorsal bilateral upper extremities Psych: Pt's affect is appropriate. Pt is cooperative Skin: Clean and intact without signs of breakdown Neuro: Alert and oriented x4, cranial nerves II through XII intact ,follows commands, able to name 2 out of 2 objects, able to repeat 3 out of 3 words, he does have expressive aphasia and dysarthria, delayed processing, comprehension appears to be normal ,sensation altered in his right upper extremity to light touch, finger-nose normal bilaterally Musculoskeletal: No joint redness or swelling noted Strength 5 out of 5 in bilateral upper extremities 4 out of 5 bilateral hip flexion 5 out of 5 bilateral knee extension Right ankle PF and  DF 2 out of 5, left ankle PF 2 out of 5, DF 1 out of 5.  Patient reports this is chronic possibly due to vascular disease Tone is normal Left upper extremity PIV Lab Results Last 48 Hours        Results for orders placed or performed during the hospital encounter of 05/25/21 (from the past 48 hour(s))  Creatinine, serum     Status: Abnormal    Collection Time: 05/30/21  4:48 AM  Result Value Ref Range    Creatinine, Ser 1.70 (H)  0.61 - 1.24 mg/dL    GFR, Estimated 37 (L) >60 mL/min      Comment: (NOTE) Calculated using the CKD-EPI Creatinine Equation (2021) Performed at [facility], [address] [place], [place] [address]        Imaging Results (Last 48 hours)  No results found.         Blood pressure 103/63, pulse 64, temperature 97.9 F (36.6 C), temperature source Oral, resp. rate 15, height 6' (1.829 m), weight 100.6 kg, SpO2 92 %.   Medical Problem List and Plan: 1. Functional deficits secondary to left MCA embolic CVA             -patient may shower             -ELOS/Goals: 16-18 days PT/OT Min A wheelchair level; SLP Mod I 2.  Antithrombotics: -DVT/anticoagulation:  Pharmaceutical: Other (comment) Eliquis now, previously on xarelto             -antiplatelet therapy: N/A 3. Pain Management: Lidoderm patch, Neurontin 200 mg twice daily and 100 mg nightly, tramadol as needed 4. Mood: Namenda 10 mg twice daily, Razadyne 8 mg daily             -antipsychotic agents: N/A 5. Neuropsych: This patient is capable of making decisions on his own behalf. 6. Skin/Wound Care: Routine skin checks.  Chronic lower extremity wounds. LLE wound from Mohs procedure. Follow-up wound care nurse 7. Fluids/Electrolytes/Nutrition: Routine in and outs with follow-up chemistries 8.  Atrial fibrillation.  Amiodarone 200 mg daily.  Continue Eliquis.  Cardiac rate controlled 9.  CAD/CABG 2014.  No chest pain or shortness of breath.Continue Imdur 60 mg daily 10.  History of severe aortic stenosis status post TAVR procedure 2021.  Follow-up cardiology services 11.  History of PMR.  Currently maintained on ARAVA 10 mg daily as well as chronic prednisone 3 mg daily 12.  BPH.  Proscar 5 mg daily.  Check PVR, Has been using external catheter 13.  Hyperlipidemia.  Lipitor/Zetia 14.  Chronic systolic congestive heart failure.  Demadex 40 mg daily. Imdur '60mg'$  daily. Monitor for signs of fluid overload 15.  OSA.  CPAP 16.   GERD.  Protonix 17.  PAD with lymphedema status post balloon angioplasty of posterior tibial artery October 2022.  Follow-up outpatient 18.  Chronic anemia.  Followed by hematology services.  He had been receiving Epogen injections as an outpatient.             -Last HGB 8.6 19. Dementia, continue galantamine and memantine 20. CKD IIIB, avoid nephrotoxic medications 21. HTN continue imdur   Cathlyn Parsons, PA-C 05/31/2021    I have personally performed a face to face diagnostic evaluation of this patient and formulated the key components of the plan.  Additionally, I have personally reviewed laboratory data, imaging studies, as well as relevant notes and concur with the physician assistant's documentation above.   The patient's  status has not changed from the original H&P.  Any changes in documentation from the acute care chart have been noted above.   Jennye Boroughs, MD    Physical Medicine and Rehabilitation Admission H&P        Chief Complaint  Patient presents with   Code Stroke  : HPI: [name] [name] is a 88 year old right-handed male with history of AAA, CAD with CABG, compression fractures, chronic anemia followed by hematology services, chronic systolic congestive heart failure, A-fib/AF status post MAZE on Xarelto, hypertension, hyperlipidemia, PMR maintained on steroids, prediabetes, early dementia maintained on Namenda, chronic lower extremity wounds with Unna boot OSA with CPAP, PAD with lymphedema status post balloon angioplasty of posterior tibial artery October 2022 recent UTI placed on antibiotic therapy.  Per chart review lives with spouse. She is in the room during history and exam.  He does have a personal care attendant Monday Wednesday Friday for 3 hours.  Modified independent transfers power wheelchair bound x3 to 4 months prior to that ambulating short distances with a rollator.  Present 05/25/2021 with right-sided weakness and aphasia of acute onset.  Cranial CT scan  showed small left MCA territory cortical infarct within the mid to posterior left frontal lobe.  CT angiogram head and neck no large vessel occlusion.  Multifocal severe stenosis within the right vertebral artery at the level of the skull base and intracranially.  MRI identified small areas of acute infarct right occipital lobe left frontal lobe over the convexity.  Mild to moderate acute infarct left operculum.  Possible cerebral emboli.  Patient did not receive tPA.  Echocardiogram ejection fraction of 65 to 70% no wall motion abnormalities.  Admission chemistries unremarkable except BUN 26 creatinine 1.71, hemoglobin 9.9, hemoglobin A1c 5.8, troponin 29-32.  Neurology follow-up currently maintained on Eliquis patient had been on Xarelto prior to admission for atrial fibrillation.  He completed a course of Omnicef for diagnosed UTI prior to hospitalization.  Tolerating regular diet.  Therapy evaluations completed due to patient's right-sided weakness and aphasia was admitted for a comprehensive rehab program. He reports chronic pain pain due to compression fracture, followed by orthopaedics.    Review of Systems  Constitutional:  Negative for fever and malaise/fatigue.  HENT:  Negative for hearing loss.   Eyes:  Negative for blurred vision and double vision.  Respiratory:  Positive for shortness of breath. Negative for cough.   Cardiovascular:  Positive for palpitations and leg swelling. Negative for chest pain.  Gastrointestinal:        GERD  Genitourinary:  Positive for urgency. Negative for dysuria, flank pain and hematuria.  Musculoskeletal:  Positive for joint pain and myalgias.  Skin:  Negative for rash.  Neurological:  Positive for speech change and weakness.  All other systems reviewed and are negative.     Past Medical History:  Diagnosis Date   AAA (abdominal aortic aneurysm) (HCC)     Arthritis     Ascending aorta dilatation ([place]) 03/13/2012    Fusiform dilatation of the ascending  thoracic aorta discovered during surgery, max diameter 4.2-4.3 cm   BPH (benign prostatic hypertrophy)     Chronic systolic heart failure ([place]) 03/03/2012   Chronic venous insufficiency     GERD (gastroesophageal reflux disease)     Glaucoma     Gout     History of kidney stones     Hyperlipidemia     Hypertension     Obesity (BMI 30-39.9)     Paroxysmal atrial fibrillation ([place])  02/29/2012    Recurrent paroxysmal, new-onset    PMR (polymyalgia rheumatica) (HCC)     Polymyalgia rheumatica ([place]) 02/06/2019   Pre-diabetes     S/P CABG x 3 03/06/2012    LIMA to LAD, SVG to OM, SVG to RCA, EVH via right thigh   S/P Maze operation for atrial fibrillation 03/06/2012    Complete bilateral lesions set using bipolar radiofrequency and cryothermy ablation with clipping of LA appendage   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    s/p TAVR with a 26 mm Edwards via the left subclavian by Drs Buena Irish and Bartle   Severe aortic stenosis 09/29/2019   Sleep apnea      USES CPAP NIGHTLY         Past Surgical History:  Procedure Laterality Date   ABDOMINAL AORTOGRAM W/LOWER EXTREMITY N/A 10/26/2020    Procedure: ABDOMINAL AORTOGRAM W/LOWER EXTREMITY;  Surgeon: Broadus John, MD;  Location: [place] [facility];  Service: Cardiovascular;  Laterality: N/A;   CARDIAC CATHETERIZATION       CATARACT EXTRACTION, BILATERAL        with lens implants   CHOLECYSTECTOMY N/A 03/24/2012    Procedure: LAPAROSCOPIC CHOLECYSTECTOMY WITH INTRAOPERATIVE CHOLANGIOGRAM;  Surgeon: Zenovia Jarred, MD;  Location: [place];  Service: General;  Laterality: N/A;   CORONARY ARTERY BYPASS GRAFT N/A 03/06/2012    Procedure: CORONARY ARTERY BYPASS GRAFTING (CABG);  Surgeon: Rexene Alberts, MD;  Location: [place];  Service: Open Heart Surgery;  Laterality: N/A;   ENDOVEIN HARVEST OF GREATER SAPHENOUS VEIN Right 03/06/2012    Procedure: ENDOVEIN HARVEST OF GREATER SAPHENOUS VEIN;  Surgeon: Rexene Alberts, MD;   Location: [place];  Service: Open Heart Surgery;  Laterality: Right;   INTRAOPERATIVE TRANSESOPHAGEAL ECHOCARDIOGRAM N/A 03/06/2012    Procedure: INTRAOPERATIVE TRANSESOPHAGEAL ECHOCARDIOGRAM;  Surgeon: Rexene Alberts, MD;  Location: [place];  Service: Open Heart Surgery;  Laterality: N/A;   IR KYPHO LUMBAR INC FX REDUCE BONE BX UNI/BIL CANNULATION INC/IMAGING   02/23/2020   LEFT HEART CATH Right 03/02/2012    Procedure: LEFT HEART CATH;  Surgeon: Sherren Mocha, MD;  Location: [facility];  Service: Cardiovascular;  Laterality: Right;   MAZE N/A 03/06/2012    Procedure: MAZE;  Surgeon: Rexene Alberts, MD;  Location: [place];  Service: Open Heart Surgery;  Laterality: N/A;   PERIPHERAL VASCULAR INTERVENTION Right 10/26/2020    Procedure: PERIPHERAL VASCULAR INTERVENTION;  Surgeon: Broadus John, MD;  Location: [place] [facility];  Service: Cardiovascular;  Laterality: Right;   PROSTATECTOMY        partial   RIGHT/LEFT HEART CATH AND CORONARY/GRAFT ANGIOGRAPHY N/A 09/29/2019    Procedure: RIGHT/LEFT HEART CATH AND CORONARY/GRAFT ANGIOGRAPHY;  Surgeon: Adrian Prows, MD;  Location: [place] [facility];  Service: Cardiovascular;  Laterality: N/A;   TEE WITHOUT CARDIOVERSION N/A 11/10/2019    Procedure: TRANSESOPHAGEAL ECHOCARDIOGRAM (TEE);  Surgeon: Burnell Blanks, MD;  Location: [place];  Service: Open Heart Surgery;  Laterality: N/A;   TRANSCATHETER AORTIC VALVE REPLACEMENT, TRANSFEMORAL   11/10/2019         Family History  Problem Relation Age of Onset   Dementia Mother     Stroke Brother      Social History:  reports that he quit smoking about 58 years ago. His smoking use included cigarettes. He has a 7.50 pack-year smoking history. He has never been exposed to tobacco smoke. He has never used smokeless tobacco. He reports current alcohol  use of about 2.0 standard drinks per week. He reports that he does not use drugs. Allergies:       Allergies  Allergen Reactions   Amoxicillin Other (See  Comments)      Headache, debilitated   Bee Venom Anaphylaxis   Avelox [Moxifloxacin] Other (See Comments)      Unknown   Codeine Other (See Comments)   Duloxetine Hcl Other (See Comments)      felt weird   Levitra [Vardenafil] Other (See Comments)      Unknown   Morphine And Related Other (See Comments)      hypotension   Penicillin G Sodium Other (See Comments)      Severe headache, fatigue "debilitated"   Testosterone Other (See Comments)      unknown   Tizanidine Other (See Comments)      unknown   Viagra [Sildenafil] Other (See Comments)      didn't like it          Medications Prior to Admission  Medication Sig Dispense Refill   acetaminophen (TYLENOL) 500 MG tablet Take 2 tablets (1,000 mg total) by mouth 3 (three) times daily. For 3 to 5 days and then change to 1000 mg 3 times daily as needed for pain. (Patient taking differently: Take 500 mg by mouth as needed for mild pain.)       amiodarone (PACERONE) 200 MG tablet Take 1 tablet (200 mg total) by mouth daily. 90 tablet 3   Ascorbic Acid (VITAMIN C) 500 MG CAPS Take 500 mg by mouth daily.       cadexomer iodine (IODOSORB) 0.9 % gel Apply 1 application. topically every other day as needed for wound care.       cefdinir (OMNICEF) 300 MG capsule Take 300 mg by mouth 2 (two) times daily. Started on 05-23-21 7 DS       cholecalciferol (VITAMIN D3) 25 MCG (1000 UNIT) tablet Take 1,000 Units by mouth daily.        Cyanocobalamin (VITAMIN B-12) 5000 MCG TBDP Take 5,000 mcg by mouth daily.       denosumab (PROLIA) 60 MG/ML SOSY injection Inject 60 mg into the skin every 6 (six) months.       docusate sodium (COLACE) 100 MG capsule Take 1 capsule (100 mg total) by mouth 2 (two) times daily. (Patient taking differently: Take 100 mg by mouth every evening.)       EPINEPHrine 0.3 mg/0.3 mL IJ SOAJ injection Inject 0.3 mLs (0.3 mg total) into the muscle as needed for anaphylaxis. (Patient taking differently: Inject 0.3 mg into the muscle  once as needed for anaphylaxis.) 2 each 2   ezetimibe (ZETIA) 10 MG tablet Take 10 mg by mouth daily.       finasteride (PROSCAR) 5 MG tablet Take 5 mg by mouth daily.       fish oil-omega-3 fatty acids 1000 MG capsule Take 1 g by mouth daily.        gabapentin (NEURONTIN) 100 MG capsule Take 100-200 mg by mouth See admin instructions. '200mg'$  in the morning, '100mg'$  in the evening, and '200mg'$  at bedtime       galantamine (RAZADYNE ER) 8 MG 24 hr capsule Take 8 mg by mouth Daily.       hydrALAZINE (APRESOLINE) 25 MG tablet Take 25 mg by mouth 2 (two) times daily.       HYDROcodone-acetaminophen (NORCO/VICODIN) 5-325 MG tablet Take 1 tablet by mouth every 6 (six) hours as needed for  moderate pain.       isosorbide mononitrate (IMDUR) 60 MG 24 hr tablet TAKE 1 TABLET(60 MG) BY MOUTH DAILY AFTER SUPPER (Patient taking differently: 60 mg daily.) 90 tablet 1   leflunomide (ARAVA) 20 MG tablet Take 10 mg by mouth daily.       Magnesium 250 MG TABS Take 250 mg by mouth daily.       memantine (NAMENDA) 10 MG tablet Take 10 mg by mouth 2 (two) times daily.       metoprolol succinate (TOPROL-XL) 25 MG 24 hr tablet Take 0.5 tablets (12.5 mg total) by mouth daily as needed (for high heart rate, consistently greater than 110/min.). (Patient taking differently: Take 12.5 mg by mouth daily.) 90 tablet 1   naftifine (NAFTIN) 1 % cream Apply 1 application. topically daily as needed (fungal skin infection).       nitroGLYCERIN (NITROSTAT) 0.4 MG SL tablet Place 1 tablet (0.4 mg total) under the tongue every 5 (five) minutes x 3 doses as needed for chest pain. 30 tablet 3   pantoprazole (PROTONIX) 40 MG tablet Take 40 mg by mouth every other day.    4   potassium chloride (MICRO-K) 10 MEQ CR capsule TAKE ONE CAPSULE BY MOUTH EVERY DAY WITH LASIX (Patient taking differently: Take 10 mEq by mouth daily.) 90 capsule 1   predniSONE (DELTASONE) 1 MG tablet Take 3 mg by mouth daily with breakfast. Decrease by 1 mg every month  until off       Psyllium Husk POWD Take 1 Scoop by mouth daily.        Rivaroxaban (XARELTO) 15 MG TABS tablet Take 15 mg by mouth daily with supper.       torsemide (DEMADEX) 20 MG tablet Take 2 tablets (40 mg total) by mouth daily. 180 tablet 1          Home: Home Living Family/patient expects to be discharged to:: Private residence Living Arrangements: Spouse/significant other Available Help at Discharge: Family, Available 24 hours/day, Personal care attendant Type of Home: House Home Access: Elevator Home Layout: Able to live on main level with bedroom/bathroom Bathroom Shower/Tub: Multimedia programmer: Handicapped height Bathroom Accessibility: Yes Home Equipment: Rollator (4 wheels), Conservation officer, nature (2 wheels), Wheelchair - manual, Grab bars - tub/shower, Civil engineer, contracting, Wheelchair - power  Lives With: Spouse   Functional History: Prior Function Prior Level of Function : Needs assist Mobility Comments: Mod I transfers. power w/c bound x 3-4 months. Prior to that ambulated short distances with rollator. ADLs Comments: PCA MWF for 3 hours, primarily to assist with bathing/shower, likely assisted for LB dressing and all IADLs   Functional Status:  Mobility: Bed Mobility Overal bed mobility: Needs Assistance Bed Mobility: Rolling, Sidelying to Sit, Sit to Sidelying Rolling: Min assist Sidelying to sit: Min assist Supine to sit: HOB elevated, Max assist Sit to sidelying: Mod assist General bed mobility comments: cues for log roll technique, increased time and use of rail; assist to raise torso; return assist to lift legs Transfers Overall transfer level: Needs assistance Equipment used: Rolling walker (2 wheels) Transfers: Sit to/from Stand, Bed to chair/wheelchair/BSC Sit to Stand: Min assist, Mod assist Bed to/from chair/wheelchair/BSC transfer type:: Step pivot Step pivot transfers: Min assist, Mod assist General transfer comment: Min assist from elevated bed  and elevated recliner x 3 reps; last 3 reps from recliner with mod assist; pivot back to bed due to orthostasis with mod assist with RUE slipping off handle of RW x  2 Ambulation/Gait Ambulation/Gait assistance: Min assist Gait Distance (Feet): 6 Feet (3 fwd, 3 backward) Assistive device: Rolling walker (2 wheels) Gait Pattern/deviations: Step-to pattern, Decreased stride length, Trunk flexed General Gait Details: pt reports he can walk up to 30 ft, but then wife states they always follow with w/c; after 3 feet pt felt he needed to return to chair Gait velocity interpretation: <1.8 ft/sec, indicate of risk for recurrent falls Pre-gait activities: pivotal steps from bed to chair with good foot clearance   ADL: ADL Overall ADL's : Needs assistance/impaired Eating/Feeding: Minimal assistance, Bed level Eating/Feeding Details (indicate cue type and reason): issued foam build up for eating utensils Grooming: Oral care, Sitting, Minimal assistance Grooming Details (indicate cue type and reason): used R hand with built up handle on toothbrush Upper Body Bathing: Total assistance, Sitting Lower Body Bathing: +2 for physical assistance, Total assistance, Sit to/from stand Upper Body Dressing : Moderate assistance, Sitting Lower Body Dressing: Total assistance, Bed level Lower Body Dressing Details (indicate cue type and reason): for socks Toileting- Clothing Manipulation and Hygiene: +2 for physical assistance, Total assistance, Sit to/from stand Toileting - Clothing Manipulation Details (indicate cue type and reason): for posterior pericare General ADL Comments: Pt less reliant on B UE support in sitting, able to complete grooming at EOB with one hand support   Cognition: Cognition Overall Cognitive Status: Impaired/Different from baseline Arousal/Alertness: Awake/alert Orientation Level: Oriented X4 Year: 2023 Month: May Day of Week: Correct Awareness: Appears intact Problem Solving:  Appears intact Safety/Judgment: Appears intact Cognition Arousal/Alertness: Awake/alert Behavior During Therapy: WFL for tasks assessed/performed Overall Cognitive Status: Impaired/Different from baseline Area of Impairment: Memory, Problem solving, Following commands, Awareness Memory: Decreased short-term memory Following Commands: Follows one step commands with increased time Awareness: Emergent Problem Solving: Slow processing, Requires verbal cues General Comments: pt did not recall OT and PT eval yesterday, cues needed to use his L hand for oral care when R was ineffective Difficult to assess due to: Impaired communication   Physical Exam: Blood pressure 103/63, pulse 64, temperature 97.9 F (36.6 C), temperature source Oral, resp. rate 15, height 6' (1.829 m), weight 100.6 kg, SpO2 92 %. Physical Exam Neurological:     Comments: Patient is alert.  Makes eye contact with examiner.  Provides name with some delay in processing he can provide the year with +/- self correction.  Patient does appear to be apraxic    General: Alert and oriented x 3, No apparent distress, lying in bed HEENT: Head is normocephalic, atraumatic, PERRLA, EOMI, sclera anicteric, oral mucosa pink and moist, dentition intact,  Neck: Supple without JVD or lymphadenopathy Heart: Reg rate and rhythm. No murmurs rubs or gallops Chest: CTA bilaterally without wheezes, rales, or rhonchi; no distress Abdomen: Soft, mildly tender, non-distended, bowel sounds positive. Extremities: No clubbing, cyanosis, or edema.  He has small wounds on his right thigh, dorsal foot covered in dressing, R heel wound,  he has a wrapped clean wound on his left lower extremity from a Mohs surgery, bruising on dorsal bilateral upper extremities Psych: Pt's affect is appropriate. Pt is cooperative Skin: Clean and intact without signs of breakdown Neuro: Alert and oriented x4, cranial nerves II through XII intact ,follows commands, able to  name 2 out of 2 objects, able to repeat 3 out of 3 words, he does have expressive aphasia and dysarthria, delayed processing, comprehension appears to be normal ,sensation altered in his right upper extremity to light touch, finger-nose normal bilaterally Musculoskeletal: No joint redness  or swelling noted Strength 5 out of 5 in bilateral upper extremities 4 out of 5 bilateral hip flexion 5 out of 5 bilateral knee extension Right ankle PF and DF 2 out of 5, left ankle PF 2 out of 5, DF 1 out of 5.  Patient reports this is chronic possibly due to vascular disease Tone is normal Left upper extremity PIV Lab Results Last 48 Hours        Results for orders placed or performed during the hospital encounter of 05/25/21 (from the past 48 hour(s))  Creatinine, serum     Status: Abnormal    Collection Time: 05/30/21  4:48 AM  Result Value Ref Range    Creatinine, Ser 1.70 (H) 0.61 - 1.24 mg/dL    GFR, Estimated 37 (L) >60 mL/min      Comment: (NOTE) Calculated using the CKD-EPI Creatinine Equation (2021) Performed at [facility], [address] [place], [place] [address]        Imaging Results (Last 48 hours)  No results found.         Blood pressure 103/63, pulse 64, temperature 97.9 F (36.6 C), temperature source Oral, resp. rate 15, height 6' (1.829 m), weight 100.6 kg, SpO2 92 %.   Medical Problem List and Plan: 1. Functional deficits secondary to left MCA embolic CVA             -patient may shower             -ELOS/Goals: 16-18 days PT/OT Min A wheelchair level; SLP Mod I 2.  Antithrombotics: -DVT/anticoagulation:  Pharmaceutical: Other (comment) Eliquis now, previously on xarelto             -antiplatelet therapy: N/A 3. Pain Management: Lidoderm patch, Neurontin 200 mg twice daily and 100 mg nightly, tramadol as needed 4. Mood: Namenda 10 mg twice daily, Razadyne 8 mg daily             -antipsychotic agents: N/A 5. Neuropsych: This patient is capable of making  decisions on his own behalf. 6. Skin/Wound Care: Routine skin checks.  Chronic lower extremity wounds. LLE wound from Mohs procedure. Follow-up wound care nurse 7. Fluids/Electrolytes/Nutrition: Routine in and outs with follow-up chemistries 8.  Atrial fibrillation.  Amiodarone 200 mg daily.  Continue Eliquis.  Cardiac rate controlled 9.  CAD/CABG 2014.  No chest pain or shortness of breath.Continue Imdur 60 mg daily 10.  History of severe aortic stenosis status post TAVR procedure 2021.  Follow-up cardiology services 11.  History of PMR.  Currently maintained on ARAVA 10 mg daily as well as chronic prednisone 3 mg daily 12.  BPH.  Proscar 5 mg daily.  Check PVR, Has been using external catheter 13.  Hyperlipidemia.  Lipitor/Zetia 14.  Chronic systolic congestive heart failure.  Demadex 40 mg daily. Imdur '60mg'$  daily. Monitor for signs of fluid overload 15.  OSA.  CPAP 16.  GERD.  Protonix 17.  PAD with lymphedema status post balloon angioplasty of posterior tibial artery October 2022.  Follow-up outpatient 18.  Chronic anemia.  Followed by hematology services.  He had been receiving Epogen injections as an outpatient.             -Last HGB 8.6 19. Dementia, continue galantamine and memantine 20. CKD IIIB, avoid nephrotoxic medications 21. HTN continue imdur   Cathlyn Parsons, PA-C 05/31/2021    I have personally performed a face to face diagnostic evaluation of this patient and formulated the key components of  the plan.  Additionally, I have personally reviewed laboratory data, imaging studies, as well as relevant notes and concur with the physician assistant's documentation above.   The patient's status has not changed from the original H&P.  Any changes in documentation from the acute care chart have been noted above.   Jennye Boroughs, MD

## 2021-05-31 NOTE — Progress Notes (Signed)
Physical Therapy Treatment Patient Details Name: [name] [name] MRN: [id number] DOB: Jun 30, 1926 Today's Date: 05/31/2021   History of Present Illness Pt is a 88 y.o. male who presented to the ED 5/25 with c/o R side weakness and difficulty speaking. MRI revealed small areas of acute infarction in the right occipital lobe and left frontal lobe over the convexity; mild to moderate acute infarct left operculum; possible cerebral emboli. PMH:  AAA; CAD s/p CABG; chronic systolic CHF; afib s/p Maze, on Xarelto; HTN; HLD; PMR (currently on steroids); pre-diabetes; OSA on CPAP; PAD with lymphedema; spinal compression fx; and AS s/p TAVR    PT Comments    Patient continues to make progress. Able to progress to short distance ambulation with close follow of recliner (6 ft x1; 5 ft x1). Able to stand at sink for up to 3 minutes for ADL task with OT. Continues to have back pain, but is able to push himself and make progress.    Recommendations for follow up therapy are one component of a multi-disciplinary discharge planning process, led by the attending physician.  Recommendations may be updated based on patient status, additional functional criteria and insurance authorization.  Follow Up Recommendations  Acute inpatient rehab (3hours/day)     Assistance Recommended at Discharge PRN  Patient can return home with the following Assistance with cooking/housework;A lot of help with bathing/dressing/bathroom;Two people to help with walking and/or transfers;Assist for transportation   Equipment Recommendations  None recommended by PT    Recommendations for Other Services Rehab consult     Precautions / Restrictions Precautions Precautions: Fall Restrictions Weight Bearing Restrictions: No     Mobility  Bed Mobility Overal bed mobility: Needs Assistance Bed Mobility: Supine to Sit     Supine to sit: Min guard, HOB elevated     General bed mobility comments: +use of rail, no physical assist     Transfers Overall transfer level: Needs assistance Equipment used: Rolling walker (2 wheels) Transfers: Sit to/from Stand Sit to Stand: +2 physical assistance, Min assist           General transfer comment: from elevated surface, increased time, does not follow verbal cues for hand placement, assist to rise and steady    Ambulation/Gait Ambulation/Gait assistance: Min assist, +2 safety/equipment Gait Distance (Feet): 6 Feet (seated rest, 5 ft) Assistive device: Rolling walker (2 wheels) Gait Pattern/deviations: Step-to pattern, Decreased stride length, Trunk flexed   Gait velocity interpretation: <1.8 ft/sec, indicate of risk for recurrent falls   General Gait Details: limited by back pain   Stairs             Wheelchair Mobility    Modified Rankin (Stroke Patients Only)       Balance Overall balance assessment: Needs assistance Sitting-balance support: No upper extremity supported, Feet supported Sitting balance-Leahy Scale: Fair     Standing balance support: Single extremity supported, During functional activity Standing balance-Leahy Scale: Poor Standing balance comment: requires one hand to stabilize on sink during oral care, as back pain increased, pt placing B elbows on sink                            Cognition Arousal/Alertness: Awake/alert Behavior During Therapy: WFL for tasks assessed/performed Overall Cognitive Status: Impaired/Different from baseline Area of Impairment: Memory, Following commands, Problem solving, Safety/judgement, Awareness                     Memory: Decreased  short-term memory Following Commands: Follows one step commands with increased time Safety/Judgement: Decreased awareness of safety, Decreased awareness of deficits (cues to monitor need to sit) Awareness: Emergent Problem Solving: Slow processing, Requires verbal cues          Exercises      General Comments General comments (skin  integrity, edema, etc.): BP stable during session (increased with activity)      Pertinent Vitals/Pain Pain Assessment Pain Assessment: Faces Faces Pain Scale: Hurts whole lot Pain Location: back Pain Descriptors / Indicators: Grimacing, Discomfort, Moaning Pain Intervention(s): Premedicated before session, Limited activity within patient's tolerance    Home Living                          Prior Function            PT Goals (current goals can now be found in the care plan section) Acute Rehab PT Goals Patient Stated Goal: improve and return home Time For Goal Achievement: 06/09/21 Potential to Achieve Goals: Good Progress towards PT goals: Progressing toward goals    Frequency    Min 4X/week      PT Plan Current plan remains appropriate    Co-evaluation PT/OT/SLP Co-Evaluation/Treatment: Yes Reason for Co-Treatment: For patient/therapist safety;To address functional/ADL transfers PT goals addressed during session: Mobility/safety with mobility;Balance;Proper use of DME OT goals addressed during session: ADL's and self-care      AM-PAC PT "6 Clicks" Mobility   Outcome Measure  Help needed turning from your back to your side while in a flat bed without using bedrails?: A Lot Help needed moving from lying on your back to sitting on the side of a flat bed without using bedrails?: A Lot Help needed moving to and from a bed to a chair (including a wheelchair)?: A Lot Help needed standing up from a chair using your arms (e.g., wheelchair or bedside chair)?: A Lot Help needed to walk in hospital room?: Total Help needed climbing 3-5 steps with a railing? : Total 6 Click Score: 10    End of Session Equipment Utilized During Treatment: Gait belt Activity Tolerance: Patient limited by pain;Treatment limited secondary to medical complications (Comment) Patient left: with call bell/phone within reach;in chair Nurse Communication: Mobility status;Other (comment)  (chronic back pain; likely will sit <2 hours) PT Visit Diagnosis: Other abnormalities of gait and mobility (R26.89);Difficulty in walking, not elsewhere classified (R26.2);Muscle weakness (generalized) (M62.81);Pain     Time: 2130-8657 PT Time Calculation (min) (ACUTE ONLY): 32 min  Charges:  $Gait Training: 8-22 mins                      Arby Barrette, PT Acute Rehabilitation Services  Pager [phone] Office [phone]    Rexanne Mano 05/31/2021, 10:14 AM

## 2021-05-31 NOTE — Consult Note (Signed)
WOC consulted today 5/31/ prior to transfer to CIR, LE wounds addressed and orders updated.   Patient has topical wound care orders for the RLE and compression therapy/topical wound care orders for the LLE.   Follow up with skin surgery center at [place].   Re consult if needed, will not follow at this time. Thanks  Bron Snellings [employer], RN,CWOCN, CNS, [place] [phone])

## 2021-05-31 NOTE — H&P (Signed)
Physical Medicine and Rehabilitation Admission H&P    Chief Complaint  Patient presents with   Code Stroke  : HPI: [name] [name] is a 88 year old right-handed male with history of AAA, CAD with CABG, compression fractures, chronic anemia followed by hematology services, chronic systolic congestive heart failure, A-fib/AF status post MAZE on Xarelto, hypertension, hyperlipidemia, PMR maintained on steroids, prediabetes, early dementia maintained on Namenda, chronic lower extremity wounds with Unna boot OSA with CPAP, PAD with lymphedema status post balloon angioplasty of posterior tibial artery October 2022 recent UTI placed on antibiotic therapy.  Per chart review lives with spouse. She is in the room during history and exam.  He does have a personal care attendant Monday Wednesday Friday for 3 hours.  Modified independent transfers power wheelchair bound x3 to 4 months prior to that ambulating short distances with a rollator.  Present 05/25/2021 with right-sided weakness and aphasia of acute onset.  Cranial CT scan showed small left MCA territory cortical infarct within the mid to posterior left frontal lobe.  CT angiogram head and neck no large vessel occlusion.  Multifocal severe stenosis within the right vertebral artery at the level of the skull base and intracranially.  MRI identified small areas of acute infarct right occipital lobe left frontal lobe over the convexity.  Mild to moderate acute infarct left operculum.  Possible cerebral emboli.  Patient did not receive tPA.  Echocardiogram ejection fraction of 65 to 70% no wall motion abnormalities.  Admission chemistries unremarkable except BUN 26 creatinine 1.71, hemoglobin 9.9, hemoglobin A1c 5.8, troponin 29-32.  Neurology follow-up currently maintained on Eliquis patient had been on Xarelto prior to admission for atrial fibrillation.  He completed a course of Omnicef for diagnosed UTI prior to hospitalization.  Tolerating regular diet.  Therapy  evaluations completed due to patient's right-sided weakness and aphasia was admitted for a comprehensive rehab program. He reports chronic pain pain due to compression fracture, followed by orthopaedics.   Review of Systems  Constitutional:  Negative for fever and malaise/fatigue.  HENT:  Negative for hearing loss.   Eyes:  Negative for blurred vision and double vision.  Respiratory:  Positive for shortness of breath. Negative for cough.   Cardiovascular:  Positive for palpitations and leg swelling. Negative for chest pain.  Gastrointestinal:        GERD  Genitourinary:  Positive for urgency. Negative for dysuria, flank pain and hematuria.  Musculoskeletal:  Positive for joint pain and myalgias.  Skin:  Negative for rash.  Neurological:  Positive for speech change and weakness.  All other systems reviewed and are negative. Past Medical History:  Diagnosis Date   AAA (abdominal aortic aneurysm) (HCC)    Arthritis    Ascending aorta dilatation ([place]) 03/13/2012   Fusiform dilatation of the ascending thoracic aorta discovered during surgery, max diameter 4.2-4.3 cm   BPH (benign prostatic hypertrophy)    Chronic systolic heart failure ([place]) 03/03/2012   Chronic venous insufficiency    GERD (gastroesophageal reflux disease)    Glaucoma    Gout    History of kidney stones    Hyperlipidemia    Hypertension    Obesity (BMI 30-39.9)    Paroxysmal atrial fibrillation (HCC) 02/29/2012   Recurrent paroxysmal, new-onset    PMR (polymyalgia rheumatica) (HCC)    Polymyalgia rheumatica (HCC) 02/06/2019   Pre-diabetes    S/P CABG x 3 03/06/2012   LIMA to LAD, SVG to OM, SVG to RCA, EVH via right thigh   S/P Maze  operation for atrial fibrillation 03/06/2012   Complete bilateral lesions set using bipolar radiofrequency and cryothermy ablation with clipping of LA appendage   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   s/p TAVR with a 26 mm Edwards via the left subclavian by  Drs Buena Irish and Bartle   Severe aortic stenosis 09/29/2019   Sleep apnea    USES CPAP NIGHTLY   Past Surgical History:  Procedure Laterality Date   ABDOMINAL AORTOGRAM W/LOWER EXTREMITY N/A 10/26/2020   Procedure: ABDOMINAL AORTOGRAM W/LOWER EXTREMITY;  Surgeon: Broadus John, MD;  Location: [facility];  Service: Cardiovascular;  Laterality: N/A;   CARDIAC CATHETERIZATION     CATARACT EXTRACTION, BILATERAL     with lens implants   CHOLECYSTECTOMY N/A 03/24/2012   Procedure: LAPAROSCOPIC CHOLECYSTECTOMY WITH INTRAOPERATIVE CHOLANGIOGRAM;  Surgeon: Zenovia Jarred, MD;  Location: [facility];  Service: General;  Laterality: N/A;   CORONARY ARTERY BYPASS GRAFT N/A 03/06/2012   Procedure: CORONARY ARTERY BYPASS GRAFTING (CABG);  Surgeon: Rexene Alberts, MD;  Location: [facility];  Service: Open Heart Surgery;  Laterality: N/A;   ENDOVEIN HARVEST OF GREATER SAPHENOUS VEIN Right 03/06/2012   Procedure: ENDOVEIN HARVEST OF GREATER SAPHENOUS VEIN;  Surgeon: Rexene Alberts, MD;  Location: [facility];  Service: Open Heart Surgery;  Laterality: Right;   INTRAOPERATIVE TRANSESOPHAGEAL ECHOCARDIOGRAM N/A 03/06/2012   Procedure: INTRAOPERATIVE TRANSESOPHAGEAL ECHOCARDIOGRAM;  Surgeon: Rexene Alberts, MD;  Location: [facility];  Service: Open Heart Surgery;  Laterality: N/A;   IR KYPHO LUMBAR INC FX REDUCE BONE BX UNI/BIL CANNULATION INC/IMAGING  02/23/2020   LEFT HEART CATH Right 03/02/2012   Procedure: LEFT HEART CATH;  Surgeon: Sherren Mocha, MD;  Location: [facility];  Service: Cardiovascular;  Laterality: Right;   MAZE N/A 03/06/2012   Procedure: MAZE;  Surgeon: Rexene Alberts, MD;  Location: [facility];  Service: Open Heart Surgery;  Laterality: N/A;   PERIPHERAL VASCULAR INTERVENTION Right 10/26/2020   Procedure: PERIPHERAL VASCULAR INTERVENTION;  Surgeon: Broadus John, MD;  Location: [facility];  Service: Cardiovascular;  Laterality: Right;   PROSTATECTOMY     partial   RIGHT/LEFT HEART CATH AND  CORONARY/GRAFT ANGIOGRAPHY N/A 09/29/2019   Procedure: RIGHT/LEFT HEART CATH AND CORONARY/GRAFT ANGIOGRAPHY;  Surgeon: Adrian Prows, MD;  Location: [facility];  Service: Cardiovascular;  Laterality: N/A;   TEE WITHOUT CARDIOVERSION N/A 11/10/2019   Procedure: TRANSESOPHAGEAL ECHOCARDIOGRAM (TEE);  Surgeon: Burnell Blanks, MD;  Location: [facility];  Service: Open Heart Surgery;  Laterality: N/A;   TRANSCATHETER AORTIC VALVE REPLACEMENT, TRANSFEMORAL  11/10/2019   Family History  Problem Relation Age of Onset   Dementia Mother    Stroke Brother    Social History:  reports that he quit smoking about 58 years ago. His smoking use included cigarettes. He has a 7.50 pack-year smoking history. He has never been exposed to tobacco smoke. He has never used smokeless tobacco. He reports current alcohol use of about 2.0 standard drinks per week. He reports that he does not use drugs. Allergies:  Allergies  Allergen Reactions   Amoxicillin Other (See Comments)    Headache, debilitated   Bee Venom Anaphylaxis   Avelox [Moxifloxacin] Other (See Comments)    Unknown   Codeine Other (See Comments)   Duloxetine Hcl Other (See Comments)    felt weird   Levitra [Vardenafil] Other (See Comments)    Unknown   Morphine And Related Other (See Comments)    hypotension  Penicillin G Sodium Other (See Comments)    Severe headache, fatigue "debilitated"   Testosterone Other (See Comments)    unknown   Tizanidine Other (See Comments)    unknown   Viagra [Sildenafil] Other (See Comments)    didn't like it   Medications Prior to Admission  Medication Sig Dispense Refill   acetaminophen (TYLENOL) 500 MG tablet Take 2 tablets (1,000 mg total) by mouth 3 (three) times daily. For 3 to 5 days and then change to 1000 mg 3 times daily as needed for pain. (Patient taking differently: Take 500 mg by mouth as needed for mild pain.)     amiodarone (PACERONE) 200 MG tablet Take 1 tablet (200 mg total) by  mouth daily. 90 tablet 3   Ascorbic Acid (VITAMIN C) 500 MG CAPS Take 500 mg by mouth daily.     cadexomer iodine (IODOSORB) 0.9 % gel Apply 1 application. topically every other day as needed for wound care.     cefdinir (OMNICEF) 300 MG capsule Take 300 mg by mouth 2 (two) times daily. Started on 05-23-21 7 DS     cholecalciferol (VITAMIN D3) 25 MCG (1000 UNIT) tablet Take 1,000 Units by mouth daily.      Cyanocobalamin (VITAMIN B-12) 5000 MCG TBDP Take 5,000 mcg by mouth daily.     denosumab (PROLIA) 60 MG/ML SOSY injection Inject 60 mg into the skin every 6 (six) months.     docusate sodium (COLACE) 100 MG capsule Take 1 capsule (100 mg total) by mouth 2 (two) times daily. (Patient taking differently: Take 100 mg by mouth every evening.)     EPINEPHrine 0.3 mg/0.3 mL IJ SOAJ injection Inject 0.3 mLs (0.3 mg total) into the muscle as needed for anaphylaxis. (Patient taking differently: Inject 0.3 mg into the muscle once as needed for anaphylaxis.) 2 each 2   ezetimibe (ZETIA) 10 MG tablet Take 10 mg by mouth daily.     finasteride (PROSCAR) 5 MG tablet Take 5 mg by mouth daily.     fish oil-omega-3 fatty acids 1000 MG capsule Take 1 g by mouth daily.      gabapentin (NEURONTIN) 100 MG capsule Take 100-200 mg by mouth See admin instructions. '200mg'$  in the morning, '100mg'$  in the evening, and '200mg'$  at bedtime     galantamine (RAZADYNE ER) 8 MG 24 hr capsule Take 8 mg by mouth Daily.     hydrALAZINE (APRESOLINE) 25 MG tablet Take 25 mg by mouth 2 (two) times daily.     HYDROcodone-acetaminophen (NORCO/VICODIN) 5-325 MG tablet Take 1 tablet by mouth every 6 (six) hours as needed for moderate pain.     isosorbide mononitrate (IMDUR) 60 MG 24 hr tablet TAKE 1 TABLET(60 MG) BY MOUTH DAILY AFTER SUPPER (Patient taking differently: 60 mg daily.) 90 tablet 1   leflunomide (ARAVA) 20 MG tablet Take 10 mg by mouth daily.     Magnesium 250 MG TABS Take 250 mg by mouth daily.     memantine (NAMENDA) 10 MG tablet  Take 10 mg by mouth 2 (two) times daily.     metoprolol succinate (TOPROL-XL) 25 MG 24 hr tablet Take 0.5 tablets (12.5 mg total) by mouth daily as needed (for high heart rate, consistently greater than 110/min.). (Patient taking differently: Take 12.5 mg by mouth daily.) 90 tablet 1   naftifine (NAFTIN) 1 % cream Apply 1 application. topically daily as needed (fungal skin infection).     nitroGLYCERIN (NITROSTAT) 0.4 MG SL tablet Place 1 tablet (  0.4 mg total) under the tongue every 5 (five) minutes x 3 doses as needed for chest pain. 30 tablet 3   pantoprazole (PROTONIX) 40 MG tablet Take 40 mg by mouth every other day.   4   potassium chloride (MICRO-K) 10 MEQ CR capsule TAKE ONE CAPSULE BY MOUTH EVERY DAY WITH LASIX (Patient taking differently: Take 10 mEq by mouth daily.) 90 capsule 1   predniSONE (DELTASONE) 1 MG tablet Take 3 mg by mouth daily with breakfast. Decrease by 1 mg every month until off     Psyllium Husk POWD Take 1 Scoop by mouth daily.      Rivaroxaban (XARELTO) 15 MG TABS tablet Take 15 mg by mouth daily with supper.     torsemide (DEMADEX) 20 MG tablet Take 2 tablets (40 mg total) by mouth daily. 180 tablet 1      Home: Home Living Family/patient expects to be discharged to:: Private residence Living Arrangements: Spouse/significant other Available Help at Discharge: Family, Available 24 hours/day, Personal care attendant Type of Home: House Home Access: Elevator Home Layout: Able to live on main level with bedroom/bathroom Bathroom Shower/Tub: Multimedia programmer: Handicapped height Bathroom Accessibility: Yes Home Equipment: Rollator (4 wheels), Conservation officer, nature (2 wheels), Wheelchair - manual, Grab bars - tub/shower, Civil engineer, contracting, Wheelchair - power  Lives With: Spouse   Functional History: Prior Function Prior Level of Function : Needs assist Mobility Comments: Mod I transfers. power w/c bound x 3-4 months. Prior to that ambulated short distances with  rollator. ADLs Comments: PCA MWF for 3 hours, primarily to assist with bathing/shower, likely assisted for LB dressing and all IADLs  Functional Status:  Mobility: Bed Mobility Overal bed mobility: Needs Assistance Bed Mobility: Rolling, Sidelying to Sit, Sit to Sidelying Rolling: Min assist Sidelying to sit: Min assist Supine to sit: HOB elevated, Max assist Sit to sidelying: Mod assist General bed mobility comments: cues for log roll technique, increased time and use of rail; assist to raise torso; return assist to lift legs Transfers Overall transfer level: Needs assistance Equipment used: Rolling walker (2 wheels) Transfers: Sit to/from Stand, Bed to chair/wheelchair/BSC Sit to Stand: Min assist, Mod assist Bed to/from chair/wheelchair/BSC transfer type:: Step pivot Step pivot transfers: Min assist, Mod assist General transfer comment: Min assist from elevated bed and elevated recliner x 3 reps; last 3 reps from recliner with mod assist; pivot back to bed due to orthostasis with mod assist with RUE slipping off handle of RW x 2 Ambulation/Gait Ambulation/Gait assistance: Min assist Gait Distance (Feet): 6 Feet (3 fwd, 3 backward) Assistive device: Rolling walker (2 wheels) Gait Pattern/deviations: Step-to pattern, Decreased stride length, Trunk flexed General Gait Details: pt reports he can walk up to 30 ft, but then wife states they always follow with w/c; after 3 feet pt felt he needed to return to chair Gait velocity interpretation: <1.8 ft/sec, indicate of risk for recurrent falls Pre-gait activities: pivotal steps from bed to chair with good foot clearance    ADL: ADL Overall ADL's : Needs assistance/impaired Eating/Feeding: Minimal assistance, Bed level Eating/Feeding Details (indicate cue type and reason): issued foam build up for eating utensils Grooming: Oral care, Sitting, Minimal assistance Grooming Details (indicate cue type and reason): used R hand with built up  handle on toothbrush Upper Body Bathing: Total assistance, Sitting Lower Body Bathing: +2 for physical assistance, Total assistance, Sit to/from stand Upper Body Dressing : Moderate assistance, Sitting Lower Body Dressing: Total assistance, Bed level Lower Body Dressing Details (  indicate cue type and reason): for socks Toileting- Clothing Manipulation and Hygiene: +2 for physical assistance, Total assistance, Sit to/from stand Toileting - Clothing Manipulation Details (indicate cue type and reason): for posterior pericare General ADL Comments: Pt less reliant on B UE support in sitting, able to complete grooming at EOB with one hand support  Cognition: Cognition Overall Cognitive Status: Impaired/Different from baseline Arousal/Alertness: Awake/alert Orientation Level: Oriented X4 Year: 2023 Month: May Day of Week: Correct Awareness: Appears intact Problem Solving: Appears intact Safety/Judgment: Appears intact Cognition Arousal/Alertness: Awake/alert Behavior During Therapy: WFL for tasks assessed/performed Overall Cognitive Status: Impaired/Different from baseline Area of Impairment: Memory, Problem solving, Following commands, Awareness Memory: Decreased short-term memory Following Commands: Follows one step commands with increased time Awareness: Emergent Problem Solving: Slow processing, Requires verbal cues General Comments: pt did not recall OT and PT eval yesterday, cues needed to use his L hand for oral care when R was ineffective Difficult to assess due to: Impaired communication  Physical Exam: Blood pressure 103/63, pulse 64, temperature 97.9 F (36.6 C), temperature source Oral, resp. rate 15, height 6' (1.829 m), weight 100.6 kg, SpO2 92 %. Physical Exam Neurological:     Comments: Patient is alert.  Makes eye contact with examiner.  Provides name with some delay in processing he can provide the year with +/- self correction.  Patient does appear to be apraxic    General: Alert and oriented x 3, No apparent distress, lying in bed HEENT: Head is normocephalic, atraumatic, PERRLA, EOMI, sclera anicteric, oral mucosa pink and moist, dentition intact,  Neck: Supple without JVD or lymphadenopathy Heart: Reg rate and rhythm. No murmurs rubs or gallops Chest: CTA bilaterally without wheezes, rales, or rhonchi; no distress Abdomen: Soft, mildly tender, non-distended, bowel sounds positive. Extremities: No clubbing, cyanosis, or edema.  He has small wounds on his right thigh, dorsal foot covered in dressing, R heel wound,  he has a wrapped clean wound on his left lower extremity from a Mohs surgery, bruising on dorsal bilateral upper extremities Psych: Pt's affect is appropriate. Pt is cooperative Skin: Clean and intact without signs of breakdown Neuro: Alert and oriented x4, cranial nerves II through XII intact ,follows commands, able to name 2 out of 2 objects, able to repeat 3 out of 3 words, he does have expressive aphasia and dysarthria, delayed processing, comprehension appears to be normal ,sensation altered in his right upper extremity to light touch, finger-nose normal bilaterally Musculoskeletal: No joint redness or swelling noted Strength 5 out of 5 in bilateral upper extremities 4 out of 5 bilateral hip flexion 5 out of 5 bilateral knee extension Right ankle PF and DF 2 out of 5, left ankle PF 2 out of 5, DF 1 out of 5.  Patient reports this is chronic possibly due to vascular disease Tone is normal Left upper extremity PIV Results for orders placed or performed during the hospital encounter of 05/25/21 (from the past 48 hour(s))  Creatinine, serum     Status: Abnormal   Collection Time: 05/30/21  4:48 AM  Result Value Ref Range   Creatinine, Ser 1.70 (H) 0.61 - 1.24 mg/dL   GFR, Estimated 37 (L) >60 mL/min    Comment: (NOTE) Calculated using the CKD-EPI Creatinine Equation (2021) Performed at [facility], [place] [address],  [place], [place] [address]    No results found.    Blood pressure 103/63, pulse 64, temperature 97.9 F (36.6 C), temperature source Oral, resp. rate 15, height 6' (  1.829 m), weight 100.6 kg, SpO2 92 %.  Medical Problem List and Plan: 1. Functional deficits secondary to left MCA embolic CVA  -patient may shower  -ELOS/Goals: 16-18 days PT/OT Min A wheelchair level; SLP Mod I 2.  Antithrombotics: -DVT/anticoagulation:  Pharmaceutical: Other (comment) Eliquis now, previously on xarelto  -antiplatelet therapy: N/A 3. Pain Management: Lidoderm patch, Neurontin 200 mg twice daily and 100 mg nightly, tramadol as needed 4. Mood: Namenda 10 mg twice daily, Razadyne 8 mg daily  -antipsychotic agents: N/A 5. Neuropsych: This patient is capable of making decisions on his own behalf. 6. Skin/Wound Care: Routine skin checks.  Chronic lower extremity wounds. LLE wound from Mohs procedure. Follow-up wound care nurse 7. Fluids/Electrolytes/Nutrition: Routine in and outs with follow-up chemistries 8.  Atrial fibrillation.  Amiodarone 200 mg daily.  Continue Eliquis.  Cardiac rate controlled 9.  CAD/CABG 2014.  No chest pain or shortness of breath.Continue Imdur 60 mg daily 10.  History of severe aortic stenosis status post TAVR procedure 2021.  Follow-up cardiology services 11.  History of PMR.  Currently maintained on ARAVA 10 mg daily as well as chronic prednisone 3 mg daily 12.  BPH.  Proscar 5 mg daily.  Check PVR, Has been using external catheter 13.  Hyperlipidemia.  Lipitor/Zetia 14.  Chronic systolic congestive heart failure.  Demadex 40 mg daily. Imdur '60mg'$  daily. Monitor for signs of fluid overload 15.  OSA.  CPAP 16.  GERD.  Protonix 17.  PAD with lymphedema status post balloon angioplasty of posterior tibial artery October 2022.  Follow-up outpatient 18.  Chronic anemia.  Followed by hematology services.  He had been receiving Epogen injections as an outpatient.  -Last HGB 8.6 19. Dementia,  continue galantamine and memantine 20. CKD IIIB, avoid nephrotoxic medications 21. HTN continue imdur  Cathlyn Parsons, PA-C 05/31/2021   I have personally performed a face to face diagnostic evaluation of this patient and formulated the key components of the plan.  Additionally, I have personally reviewed laboratory data, imaging studies, as well as relevant notes and concur with the physician assistant's documentation above.  The patient's status has not changed from the original H&P.  Any changes in documentation from the acute care chart have been noted above.  Jennye Boroughs, MD

## 2021-05-31 NOTE — Discharge Summary (Signed)
PATIENT DETAILS Name: [name] [name] Age: 88 y.o. Sex: male Date of Birth: 1926/01/26 MRN: [id number]. Admitting Physician: Karmen Bongo, MD [id number], Elta Guadeloupe, MD  Admit Date: 05/25/2021 Discharge date: 05/31/2021  Recommendations for Outpatient Follow-up:  Follow up with PCP in 1-2 weeks Please obtain CMP/CBC in one week Please ensure follow-up at the stroke clinic.  Admitted From:  Home   Disposition: CIR   Discharge Condition: good  CODE STATUS:   Code Status: DNR   Diet recommendation:  Diet Order             Diet - low sodium heart healthy           Diet Heart Room service appropriate? Yes; Fluid consistency: Thin  Diet effective now                    Brief Summary: Patient is a 88 year old male with with history of CAD s/p CABG (2014), paroxysmal atrial fibrillation s/p maze procedure-on Xarelto, AAS-s/p TAVR (2021), HFpEF HTN, HLD, PMR, OSA on CPAP, PAD s/p lymphedema-presented with dysarthria, right sided weakness-patient was found to have acute CVA and subsequently admitted to the hospitalist service.  See below for further details.     Significant events: 5/25>> dysarthria/right-sided weakness-CVA on MRI-admit to [facility].   Significant studies: 5/25>> CT head: Small left MCA territory cortical infarct 5/25>> CTA head: No intracranial LVO-multifocal severe stenosis within the right vertebral artery, moderate stenosis proximal V4 left vertebral artery, moderate stenosis paraclinoid right ICA 5/25>> CTA neck: No LVO-no hemodynamically significant stenosis. 5/25>>MRI Brain: Acute infarction right occipital lobe, left frontal lobe, left operculum 5/25>> A1c 5.8 5/25>> LDL: 122 5/26>> Echo-65-70%   Significant microbiology data:     Procedures: None   Consults: Neurology  Brief Hospital Course: Acute embolic CVA: Continues to have some amount of dysarthria-work-up as above-concern for Xarelto failure-and has been switched to Eliquis.   Recommendations from PT/OT for CIR. please ensure follow-up at the stroke clinic after his discharge from CIR.   PAF: Maintaining sinus rhythm-continue amiodarone-see above regarding anticoagulation.   HLD: LDL not at goal-now on Lipitor 40 mg daily-also on Zetia.     HTN: BP relatively stable-continue Imdur, hydralazine   CAD s/p CABG 2014: Stable-has had issues with chest pain a few days ago-relieved by nitroglycerin.     History of severe AS-s/p TAVR procedure 2021: Echo showing stable prosthetic aortic valve.   Chronic HFpEF: Euvolemic-continue Demadex.   PAD s/p balloon angioplasty of posterior tibial artery October 2022: Vascular surgery in the outpatient setting   History of RA/PMR: On prednisone and leflunomide.   Normocytic anemia: Attributed to CKD/chronic disease-being followed by Dr. Alen Blew in the outpatient setting   CKD stage IIIb: Creatinine close to baseline-follow periodically.   UTI: Diagnosed prior to this hospitalization-on Omnicef.   BPH: Stable-continue finasteride   Dementia: Appears to be mild-continue Namenda and galantamine   Bilateral lower extremity wounds: Being followed by wound care in the outpatient setting   Obesity: Estimated body mass index is 30.08 kg/m as calculated from the following:   Height as of this encounter: 6' (1.829 m).   Weight as of this encounter: 100.6 kg.    Pressure Ulcer: Pressure Injury 05/26/21 Heel Right Stage 3 -  Full thickness tissue loss. Subcutaneous fat may be visible but bone, tendon or muscle are NOT exposed. (Active)  05/26/21   Location: Heel  Location Orientation: Right  Staging: Stage 3 -  Full thickness tissue loss. Subcutaneous fat may  be visible but bone, tendon or muscle are NOT exposed.  Wound Description (Comments):   Present on Admission: Yes  Dressing Type Foam - Lift dressing to assess site every shift 05/30/21 2130    Nutrition Status: Nutrition Problem: Increased nutrient needs Etiology:  acute illness Signs/Symptoms: estimated needs Interventions: Ensure Enlive (each supplement provides 350kcal and 20 grams of protein), MVI, Refer to RD note for recommendations    RN pressure injury documentation: Pressure Injury 10/22/20 Heel Right;Lateral Stage 3 -  Full thickness tissue loss. Subcutaneous fat may be visible but bone, tendon or muscle are NOT exposed. 1.4cm x 1.6cm x 0.1cm (Active)  10/22/20 1843  Location: Heel  Location Orientation: Right;Lateral  Staging: Stage 3 -  Full thickness tissue loss. Subcutaneous fat may be visible but bone, tendon or muscle are NOT exposed.  Wound Description (Comments): 1.4cm x 1.6cm x 0.1cm  Present on Admission: Yes     Pressure Injury 10/22/20 Heel Right;Medial Stage 3 -  Full thickness tissue loss. Subcutaneous fat may be visible but bone, tendon or muscle are NOT exposed. 1.2cm x 1.0cm x 0.1cm (Active)  10/22/20 1846  Location: Heel  Location Orientation: Right;Medial  Staging: Stage 3 -  Full thickness tissue loss. Subcutaneous fat may be visible but bone, tendon or muscle are NOT exposed.  Wound Description (Comments): 1.2cm x 1.0cm x 0.1cm  Present on Admission: Yes     Pressure Injury 05/26/21 Heel Right Stage 3 -  Full thickness tissue loss. Subcutaneous fat may be visible but bone, tendon or muscle are NOT exposed. (Active)  05/26/21   Location: Heel  Location Orientation: Right  Staging: Stage 3 -  Full thickness tissue loss. Subcutaneous fat may be visible but bone, tendon or muscle are NOT exposed.  Wound Description (Comments):   Present on Admission: Yes  Dressing Type Foam - Lift dressing to assess site every shift 05/30/21 2130     Discharge Diagnoses:  Principal Problem:   Acute CVA (cerebrovascular accident) [facility]) Active Problems:   Hypertension   Hyperlipidemia   Paroxysmal atrial fibrillation (HCC)   S/P CABG x 3   S/P Maze operation for atrial fibrillation   OSA (obstructive sleep apnea)   S/P TAVR  (transcatheter aortic valve replacement)   CKD (chronic kidney disease) stage 3, GFR 30-59 ml/min (HCC)   Urinary tract infection with hematuria   Pressure injury of skin   Dementia ([place])   Polymyalgia rheumatica ([place])   DNR (do not resuscitate)   Discharge Instructions:  Activity:  As tolerated with Full fall precautions use walker/cane & assistance as needed   Discharge Instructions     Ambulatory referral to Neurology   Complete by: As directed    An appointment is requested in approximately: 8 weeks   Diet - low sodium heart healthy   Complete by: As directed    Discharge wound care:   Complete by: As directed    1. Foam dressing to left toenail, right thigh. Change Q 3 days or PRN soiling. 2. Cut small piece of Aquacel Kellie Simmering # [phone]) and apply to right outer ankle and cover with foam, change Q M/W/F  3. LEAVE RIGHT UNNA BOOT IN PLACE; pt plans to have it changed by an outpatient physician after discharge 4. Leave right heel cup and mesh to hold in place to right heel with compression stocking in place, do not throw away.  Remove compression sock each shift to assess skin. 5. Float bilat heels to reduce pressure  Increase activity slowly   Complete by: As directed       Allergies as of 05/31/2021       Reactions   Amoxicillin Other (See Comments)   Headache, debilitated   Bee Venom Anaphylaxis   Avelox [moxifloxacin] Other (See Comments)   Unknown   Codeine Other (See Comments)   Duloxetine Hcl Other (See Comments)   felt weird   Levitra [vardenafil] Other (See Comments)   Unknown   Morphine And Related Other (See Comments)   hypotension   Penicillin G Sodium Other (See Comments)   Severe headache, fatigue "debilitated"   Testosterone Other (See Comments)   unknown   Tizanidine Other (See Comments)   unknown   Viagra [sildenafil] Other (See Comments)   didn't like it        Medication List     STOP taking these medications    cefdinir 300 MG  capsule Commonly known as: OMNICEF   HYDROcodone-acetaminophen 5-325 MG tablet Commonly known as: NORCO/VICODIN   Rivaroxaban 15 MG Tabs tablet Commonly known as: XARELTO       TAKE these medications    acetaminophen 500 MG tablet Commonly known as: TYLENOL Take 2 tablets (1,000 mg total) by mouth 3 (three) times daily. For 3 to 5 days and then change to 1000 mg 3 times daily as needed for pain. What changed:  how much to take when to take this reasons to take this additional instructions   amiodarone 200 MG tablet Commonly known as: PACERONE Take 1 tablet (200 mg total) by mouth daily.   apixaban 2.5 MG Tabs tablet Commonly known as: ELIQUIS Take 1 tablet (2.5 mg total) by mouth 2 (two) times daily.   atorvastatin 40 MG tablet Commonly known as: LIPITOR Take 1 tablet (40 mg total) by mouth daily. Start taking on: June 01, 2021   cadexomer iodine 0.9 % gel Commonly known as: IODOSORB Apply 1 application. topically every other day as needed for wound care.   cholecalciferol 25 MCG (1000 UNIT) tablet Commonly known as: VITAMIN D3 Take 1,000 Units by mouth daily.   denosumab 60 MG/ML Sosy injection Commonly known as: PROLIA Inject 60 mg into the skin every 6 (six) months.   docusate sodium 100 MG capsule Commonly known as: COLACE Take 1 capsule (100 mg total) by mouth 2 (two) times daily. What changed: when to take this   EPINEPHrine 0.3 mg/0.3 mL Soaj injection Commonly known as: EPI-PEN Inject 0.3 mLs (0.3 mg total) into the muscle as needed for anaphylaxis. What changed: when to take this   ezetimibe 10 MG tablet Commonly known as: ZETIA Take 10 mg by mouth daily.   feeding supplement Liqd Take 237 mLs by mouth 2 (two) times daily between meals.   finasteride 5 MG tablet Commonly known as: PROSCAR Take 5 mg by mouth daily.   fish oil-omega-3 fatty acids 1000 MG capsule Take 1 g by mouth daily.   gabapentin 100 MG capsule Commonly known as:  NEURONTIN Take 100-200 mg by mouth See admin instructions. '200mg'$  in the morning, '100mg'$  in the evening, and '200mg'$  at bedtime   galantamine 8 MG 24 hr capsule Commonly known as: RAZADYNE ER Take 8 mg by mouth Daily.   hydrALAZINE 25 MG tablet Commonly known as: APRESOLINE Take 25 mg by mouth 2 (two) times daily.   isosorbide mononitrate 60 MG 24 hr tablet Commonly known as: IMDUR TAKE 1 TABLET(60 MG) BY MOUTH DAILY AFTER SUPPER What changed: See the new instructions.  leflunomide 20 MG tablet Commonly known as: ARAVA Take 10 mg by mouth daily.   lidocaine 5 % Commonly known as: LIDODERM Place 1 patch onto the skin daily. Remove & Discard patch within 12 hours or as directed by MD Start taking on: June 01, 2021   Magnesium 250 MG Tabs Take 250 mg by mouth daily.   memantine 10 MG tablet Commonly known as: NAMENDA Take 10 mg by mouth 2 (two) times daily.   metoprolol succinate 25 MG 24 hr tablet Commonly known as: TOPROL-XL Take 0.5 tablets (12.5 mg total) by mouth daily as needed (for high heart rate, consistently greater than 110/min.). What changed: when to take this   naftifine 1 % cream Commonly known as: NAFTIN Apply 1 application. topically daily as needed (fungal skin infection).   nitroGLYCERIN 0.4 MG SL tablet Commonly known as: Nitrostat Place 1 tablet (0.4 mg total) under the tongue every 5 (five) minutes x 3 doses as needed for chest pain.   pantoprazole 40 MG tablet Commonly known as: PROTONIX Take 40 mg by mouth every other day.   potassium chloride 10 MEQ CR capsule Commonly known as: MICRO-K TAKE ONE CAPSULE BY MOUTH EVERY DAY WITH LASIX What changed:  how much to take how to take this when to take this additional instructions   predniSONE 1 MG tablet Commonly known as: DELTASONE Take 3 mg by mouth daily with breakfast. Decrease by 1 mg every month until off   Psyllium Husk Powd Take 1 Scoop by mouth daily.   torsemide 20 MG  tablet Commonly known as: DEMADEX Take 2 tablets (40 mg total) by mouth daily.   traMADol 50 MG tablet Commonly known as: ULTRAM Take 0.5 tablets (25 mg total) by mouth every 6 (six) hours as needed for moderate pain.   Vitamin B-12 5000 MCG Tbdp Take 5,000 mcg by mouth daily.   Vitamin C 500 MG Caps Take 500 mg by mouth daily.               Discharge Care Instructions  (From admission, onward)           Start     Ordered   05/31/21 0000  Discharge wound care:       Comments: 1. Foam dressing to left toenail, right thigh. Change Q 3 days or PRN soiling. 2. Cut small piece of Aquacel Kellie Simmering # [phone]) and apply to right outer ankle and cover with foam, change Q M/W/F  3. LEAVE RIGHT UNNA BOOT IN PLACE; pt plans to have it changed by an outpatient physician after discharge 4. Leave right heel cup and mesh to hold in place to right heel with compression stocking in place, do not throw away.  Remove compression sock each shift to assess skin. 5. Float bilat heels to reduce pressure   05/31/21 0853            Follow-up Information     [name], MD. Schedule an appointment as soon as possible for a visit in 1 week(s).   Specialty: Internal Medicine Contact information: [address] [place] [address] [phone]         [facility] Follow up.   Why: Office will call with date/time, If you dont hear from them,please give them a call Contact information: [address]     [place] [id number] [phone]               Allergies  Allergen Reactions  Amoxicillin Other (See Comments)    Headache, debilitated   Bee Venom Anaphylaxis   Avelox [Moxifloxacin] Other (See Comments)    Unknown   Codeine Other (See Comments)   Duloxetine Hcl Other (See Comments)    felt weird   Levitra [Vardenafil] Other (See Comments)    Unknown   Morphine And Related Other (See Comments)    hypotension    Penicillin G Sodium Other (See Comments)    Severe headache, fatigue "debilitated"   Testosterone Other (See Comments)    unknown   Tizanidine Other (See Comments)    unknown   Viagra [Sildenafil] Other (See Comments)    didn't like it     Other Procedures/Studies: MR BRAIN WO CONTRAST  Result Date: 05/25/2021 CLINICAL DATA:  Acute neuro deficit.  Stroke. EXAM: MRI HEAD WITHOUT CONTRAST TECHNIQUE: Multiplanar, multiecho pulse sequences of the brain and surrounding structures were obtained without intravenous contrast. COMPARISON:  CT angio head and neck 05/25/2021 FINDINGS: Brain: Small acute infarct right occipital cortex. Small acute infarct in the left frontal cortex over the convexity. Small to moderate cortical infarct left operculum. Generalized atrophy. Mild chronic microvascular ischemic changes. Small chronic infarcts in the cerebellum bilaterally. Negative for mass. Chronic hemorrhage in the right frontal lobe. Vascular: Normal arterial flow voids. Skull and upper cervical spine: No focal skeletal lesion. Sinuses/Orbits: Paranasal sinuses clear. Bilateral cataract extraction Other: None IMPRESSION: Small areas of acute infarction in the right occipital lobe and left frontal lobe over the convexity. Mild to moderate acute infarct left operculum. Possible cerebral emboli. Atrophy and mild chronic microvascular ischemia Electronically Signed   By: Franchot Gallo M.D.   On: 05/25/2021 15:12   ECHOCARDIOGRAM COMPLETE  Result Date: 05/26/2021    ECHOCARDIOGRAM REPORT   Patient Name:   [name] [name] Date of Exam: 05/26/2021 Medical Rec #:  [id number]     Height:       72.0 in Accession #:    [id number]    Weight:       221.8 lb Date of Birth:  16-Aug-1926      BSA:          2.226 m Patient Age:    68 years      BP:           113/50 mmHg Patient Gender: M             HR:           63 bpm. Exam Location:  Inpatient Procedure: 2D Echo, Cardiac Doppler and Color Doppler Indications:    Stroke  History:         Patient has prior history of Echocardiogram examinations, most                 recent 12/27/2020. Risk Factors:Hypertension and Sleep Apnea.                 Aortic Valve: 26 mm Sapien prosthetic, stented (TAVR) valve is                 present in the aortic position. Procedure Date: 11/10/19.  Sonographer:    Jefferey Pica Referring Phys: [place]  Sonographer Comments: Image acquisition challenging due to respiratory motion. IMPRESSIONS  1. Left ventricular ejection fraction, by estimation, is 65 to 70%. The left ventricle has normal function. The left ventricle has no regional wall motion abnormalities. There is severe asymmetric left ventricular hypertrophy of the septal segment. Left  ventricular diastolic parameters are indeterminate.  2. Right ventricular systolic function is normal. The right ventricular size is normal. There is mildly elevated pulmonary artery systolic pressure. The estimated right ventricular systolic pressure is 40.3 mmHg.  3. The mitral valve is degenerative. Trivial mitral valve regurgitation. No evidence of mitral stenosis.  4. The aortic valve has been repaired/replaced. Aortic valve regurgitation is mild and paravalvular. There is a 26 mm Sapien prosthetic (TAVR) valve present in the aortic position. Procedure Date: 11/10/19. Mean systolic gradient 10 mmHg.  5. Aortic dilatation noted. There is mild dilatation of the ascending aorta, measuring 42 mm.  6. The inferior vena cava is normal in size with greater than 50% respiratory variability, suggesting right atrial pressure of 3 mmHg. Conclusion(s)/Recommendation(s): No intracardiac source of embolism detected on this transthoracic study. Consider a transesophageal echocardiogram to exclude cardiac source of embolism if clinically indicated. FINDINGS  Left Ventricle: Left ventricular ejection fraction, by estimation, is 65 to 70%. The left ventricle has normal function. The left ventricle has no regional wall motion  abnormalities. The left ventricular internal cavity size was normal in size. There is  severe asymmetric left ventricular hypertrophy of the septal segment. Left ventricular diastolic parameters are indeterminate. Right Ventricle: The right ventricular size is normal. No increase in right ventricular wall thickness. Right ventricular systolic function is normal. There is mildly elevated pulmonary artery systolic pressure. The tricuspid regurgitant velocity is 3.02  m/s, and with an assumed right atrial pressure of 3 mmHg, the estimated right ventricular systolic pressure is 47.4 mmHg. Left Atrium: Left atrial size was normal in size. Right Atrium: Right atrial size was normal in size. Pericardium: Trivial pericardial effusion is present. Presence of epicardial fat layer. Mitral Valve: The mitral valve is degenerative in appearance. Mild to moderate mitral annular calcification. Trivial mitral valve regurgitation. No evidence of mitral valve stenosis. Tricuspid Valve: The tricuspid valve is normal in structure. Tricuspid valve regurgitation is mild . No evidence of tricuspid stenosis. Aortic Valve: The aortic valve has been repaired/replaced. Aortic valve regurgitation is mild. No aortic stenosis is present. Aortic valve mean gradient measures 9.9 mmHg. Aortic valve peak gradient measures 16.3 mmHg. Aortic valve area, by VTI measures 2.43 cm. There is a 26 mm Sapien prosthetic, stented (TAVR) valve present in the aortic position. Procedure Date: 11/10/19. Pulmonic Valve: The pulmonic valve was normal in structure. Pulmonic valve regurgitation is trivial. No evidence of pulmonic stenosis. Aorta: Aortic dilatation noted and the aortic root was not well visualized. There is mild dilatation of the ascending aorta, measuring 42 mm. Venous: The inferior vena cava is normal in size with greater than 50% respiratory variability, suggesting right atrial pressure of 3 mmHg. IAS/Shunts: No atrial level shunt detected by color  flow Doppler.  LEFT VENTRICLE PLAX 2D LVIDd:         5.20 cm   Diastology LVIDs:         3.40 cm   LV e' lateral:   5.37 cm/s LV PW:         1.30 cm   LV E/e' lateral: 22.0 LV IVS:        1.60 cm LVOT diam:     2.20 cm LV SV:         120 LV SV Index:   54 LVOT Area:     3.80 cm  IVC IVC diam: 2.10 cm LEFT ATRIUM           Index LA diam:      3.90 cm 1.75 cm/m LA Vol (  A2C): 49.8 ml 22.37 ml/m LA Vol (A4C): 62.9 ml 28.25 ml/m  AORTIC VALVE                     PULMONIC VALVE AV Area (Vmax):    2.56 cm      PV Vmax:       0.75 m/s AV Area (Vmean):   2.00 cm      PV Peak grad:  2.3 mmHg AV Area (VTI):     2.43 cm AV Vmax:           202.10 cm/s AV Vmean:          169.689 cm/s AV VTI:            0.495 m AV Peak Grad:      16.3 mmHg AV Mean Grad:      9.9 mmHg LVOT Vmax:         136.00 cm/s LVOT Vmean:        89.400 cm/s LVOT VTI:          0.316 m LVOT/AV VTI ratio: 0.64  AORTA Ao Root diam: 3.70 cm Ao Asc diam:  4.15 cm MITRAL VALVE                TRICUSPID VALVE MV Area (PHT): 3.51 cm     TR Peak grad:   36.5 mmHg MV Decel Time: 216 msec     TR Vmax:        302.00 cm/s MV E velocity: 118.00 cm/s MV A velocity: 25.10 cm/s   SHUNTS MV E/A ratio:  4.70         Systemic VTI:  0.32 m                             Systemic Diam: 2.20 cm Cherlynn Kaiser MD Electronically signed by Cherlynn Kaiser MD Signature Date/Time: 05/26/2021/11:59:05 AM    Final    CT HEAD CODE STROKE WO CONTRAST  Addendum Date: 05/25/2021   ADDENDUM REPORT: 05/25/2021 12:40 ADDENDUM: Impression #1 called by telephone at the time of interpretation on 05/25/2021 at 11:50 am to provider Dr. Theda Sers, Who verbally acknowledged these results. Electronically Signed   By: Kellie Simmering D.O.   On: 05/25/2021 12:40   Result Date: 05/25/2021 CLINICAL DATA:  Code stroke. Provided history: Neuro deficit, acute, stroke suspected. EXAM: CT HEAD WITHOUT CONTRAST TECHNIQUE: Contiguous axial images were obtained from the base of the skull through the vertex  without intravenous contrast. RADIATION DOSE REDUCTION: This exam was performed according to the departmental dose-optimization program which includes automated exposure control, adjustment of the mA and/or kV according to patient size and/or use of iterative reconstruction technique. COMPARISON:  Prior head CT examinations 08/17/2020 and earlier. FINDINGS: Brain: Mild-to-moderate generalized cerebral atrophy. Small left MCA territory cortical infarct within the mid-to-posterior left frontal lobe, new from the prior head CT of 08/17/2020 and likely acute (series 2, images 8-10). Mild ill-defined hypoattenuation within the cerebral white matter, nonspecific but compatible with chronic small vessel ischemic disease. Redemonstrated small chronic infarct within the left cerebellar hemisphere. There is no acute intracranial hemorrhage. No extra-axial fluid collection. No evidence of an intracranial mass. No midline shift. Vascular: No hyperdense vessel. Atherosclerotic calcifications. Skull: No fracture or aggressive osseous lesion. Sinuses/Orbits: No mass or acute finding within the imaged orbits. Mild mucosal thickening and small mucous retention cysts within the bilateral ethmoid air cells. ASPECTS [facility] Stroke Program Early CT  Score) - Ganglionic level infarction (caudate, lentiform nuclei, internal capsule, insula, M1-M3 cortex): 7 - Supraganglionic infarction (M4-M6 cortex): 2 Total score (0-10 with 10 being normal): 9 Attempts are being made to reach the ordering provider at this time. IMPRESSION: Small left MCA territory cortical infarct within the mid-to-posterior left frontal lobe, new from the prior head CT of 08/17/2020 and likely acute. ASPECTS is 9. Mild chronic small vessel ischemic changes within the cerebral white matter. Redemonstrated small chronic infarct within the left cerebellar hemisphere. Mild-to-moderate generalized cerebral atrophy. Bilateral ethmoid sinusitis. Electronically Signed: By:  Kellie Simmering D.O. On: 05/25/2021 11:43   CT ANGIO HEAD NECK W WO CM (CODE STROKE)  Result Date: 05/25/2021 CLINICAL DATA:  Provided history: Neuro deficit, acute, stroke suspected; acute right-sided weakness with expressive aphasia. EXAM: CT ANGIOGRAPHY HEAD AND NECK TECHNIQUE: Multidetector CT imaging of the head and neck was performed using the standard protocol during bolus administration of intravenous contrast. Multiplanar CT image reconstructions and MIPs were obtained to evaluate the vascular anatomy. Carotid stenosis measurements (when applicable) are obtained utilizing NASCET criteria, using the distal internal carotid diameter as the denominator. RADIATION DOSE REDUCTION: This exam was performed according to the departmental dose-optimization program which includes automated exposure control, adjustment of the mA and/or kV according to patient size and/or use of iterative reconstruction technique. CONTRAST:  41m OMNIPAQUE IOHEXOL 350 MG/ML SOLN COMPARISON:  Noncontrast head CT performed immediately prior 05/25/2021. FINDINGS: CTA NECK FINDINGS Aortic arch: Standard aortic branching. Atherosclerotic plaque within the visualized aortic arch and proximal major branch vessels of the neck. No hemodynamically significant innominate or proximal subclavian artery stenosis. Right carotid system: CCA and ICA patent within the neck. Soft and calcified atherosclerotic plaque about the carotid bifurcation and within the proximal ICA, resulting in less than 50% stenosis at the ICA origin. Left carotid system: CCA and ICA patent within the neck. Predominantly calcified plaque within the CCA, bout the carotid bifurcation and within the proximal ICA without measurable stenosis. Vertebral arteries: Vertebral arteries patent within the neck. The left vertebral artery is dominant. Nonstenotic calcified plaque at the origin of the left vertebral artery. Skeleton: Straightening of the expected cervical lordosis. 2 mm C4-C5  grade 1 anterolisthesis. Cervical spondylosis. Vertebral ankylosis at C2-C3 and C5-C6. No acute fracture or aggressive osseous lesion. Other neck: No neck mass or cervical lymphadenopathy. Upper chest: Prior median sternotomy. Incompletely imaged bilateral pleural effusions. Interlobular septal thickening and ground-glass opacity within the imaged lung apices, likely reflecting edema. Review of the MIP images confirms the above findings CTA HEAD FINDINGS Anterior circulation: The intracranial internal carotid arteries are patent. Calcified plaque within both vessels. Moderate stenosis of the right paraclinoid segment. Otherwise, no more than mild stenosis of the intracranial ICAs. The M1 middle cerebral arteries are patent. No M2 proximal branch occlusion or high-grade proximal stenosis. The anterior cerebral arteries are patent. Posterior circulation: Severe atherosclerotic irregularity with severe multifocal stenoses in the right vertebral artery at the level of skull base and intracranially. Calcified plaque within the proximal V4 left vertebral artery with moderate stenosis. The basilar artery is patent. The posterior cerebral arteries are patent. The left PCA is fetal in origin. The right posterior communicating artery is diminutive or absent. Venous sinuses: Within the limitations of contrast timing, no convincing thrombus. Anatomic variants: As described. Review of the MIP images confirms the above findings No emergent large vessel occlusion. This finding and CTA head impression #3 called by telephone at the time of interpretation on 05/25/2021 at  11:50 am to provider Lynnae Sandhoff , who verbally acknowledged these results. IMPRESSION: CTA neck: 1. The common carotid and internal carotid arteries are patent within the neck. Atherosclerotic plaque bilaterally, without hemodynamically significant stenosis. 2. Vertebral arteries patent within the neck. Non-stenotic calcified plaque at the origin of the left  vertebral artery. 3. Pulmonary edema within the imaged lung apices. 4. Incompletely imaged bilateral pleural effusions. 5. Cervical spondylosis. C2-C3 and C5-C6 vertebral ankylosis. CTA head: 1. No intracranial large vessel occlusion is identified. 2. Intracranial atherosclerotic disease with multifocal stenoses, most notably as follows. 3. Multifocal severe stenoses within the right vertebral artery at the level of the skull base and intracranially. 4. Moderate stenosis within the proximal V4 left vertebral artery. 5. Moderate stenosis of the paraclinoid right ICA. Electronically Signed   By: Kellie Simmering D.O.   On: 05/25/2021 12:15     TODAY-DAY OF DISCHARGE:  Subjective:   [name] [name] today has no headache,no chest abdominal pain,no new weakness tingling or numbness, feels much better wants to go home today.   Objective:   Blood pressure 103/63, pulse 64, temperature 97.9 F (36.6 C), temperature source Oral, resp. rate 15, height 6' (1.829 m), weight 100.6 kg, SpO2 92 %.  Intake/Output Summary (Last 24 hours) at 05/31/2021 0854 Last data filed at 05/31/2021 0351 Gross per 24 hour  Intake 240 ml  Output 1400 ml  Net -1160 ml   Filed Weights   05/25/21 1100  Weight: 100.6 kg    Exam: Awake Alert, Oriented *3, No new F.N deficits, Normal affect .AT,PERRAL Supple Neck,No JVD, No cervical lymphadenopathy appriciated.  Symmetrical Chest wall movement, Good air movement bilaterally, CTAB RRR,No Gallops,Rubs or new Murmurs, No Parasternal Heave +ve B.Sounds, Abd Soft, Non tender, No organomegaly appriciated, No rebound -guarding or rigidity. No Cyanosis, Clubbing or edema, No new Rash or bruise   PERTINENT RADIOLOGIC STUDIES: No results found.   PERTINENT LAB RESULTS: CBC: No results for input(s): WBC, HGB, HCT, PLT in the last 72 hours. CMET CMP     Component Value Date/Time   NA 140 05/26/2021 0605   NA 136 06/16/2020 1607   K 3.5 05/26/2021 0605   CL 105 05/26/2021  0605   CO2 26 05/26/2021 0605   GLUCOSE 103 (H) 05/26/2021 0605   BUN 23 05/26/2021 0605   BUN 38 (H) 06/16/2020 1607   CREATININE 1.70 (H) 05/30/2021 0448   CREATININE 1.95 (H) 04/28/2021 1123   CREATININE 1.1 04/22/2015 1029   CREATININE 1.40 (H) 03/30/2014 1157   CALCIUM 9.3 05/26/2021 0605   PROT 6.8 05/25/2021 1124   PROT 6.6 09/23/2019 1341   ALBUMIN 3.3 (L) 05/25/2021 1124   ALBUMIN 4.4 09/23/2019 1341   AST 27 05/25/2021 1124   AST 19 04/28/2021 1123   ALT 20 05/25/2021 1124   ALT 13 04/28/2021 1123   ALKPHOS 94 05/25/2021 1124   BILITOT 0.6 05/25/2021 1124   BILITOT 0.3 04/28/2021 1123   GFRNONAA 37 (L) 05/30/2021 0448   GFRNONAA 32 (L) 04/28/2021 1123   GFRAA 38 (L) 12/18/2019 1124    GFR Estimated Creatinine Clearance: 34 mL/min (A) (by C-G formula based on SCr of 1.7 mg/dL (H)). No results for input(s): LIPASE, AMYLASE in the last 72 hours. No results for input(s): CKTOTAL, CKMB, CKMBINDEX, TROPONINI in the last 72 hours. Invalid input(s): POCBNP No results for input(s): DDIMER in the last 72 hours. No results for input(s): HGBA1C in the last 72 hours. No results for input(s): CHOL, HDL, LDLCALC,  TRIG, CHOLHDL, LDLDIRECT in the last 72 hours. No results for input(s): TSH, T4TOTAL, T3FREE, THYROIDAB in the last 72 hours.  Invalid input(s): FREET3 No results for input(s): VITAMINB12, FOLATE, FERRITIN, TIBC, IRON, RETICCTPCT in the last 72 hours. Coags: No results for input(s): INR in the last 72 hours.  Invalid input(s): PT Microbiology: No results found for this or any previous visit (from the past 240 hour(s)).  FURTHER DISCHARGE INSTRUCTIONS:  Get Medicines reviewed and adjusted: Please take all your medications with you for your next visit with your Primary MD  Laboratory/radiological data: Please request your Primary MD to go over all hospital tests and procedure/radiological results at the follow up, please ask your Primary MD to get all Hospital  records sent to his/her office.  In some cases, they will be blood work, cultures and biopsy results pending at the time of your discharge. Please request that your primary care M.D. goes through all the records of your hospital data and follows up on these results.  Also Note the following: If you experience worsening of your admission symptoms, develop shortness of breath, life threatening emergency, suicidal or homicidal thoughts you must seek medical attention immediately by calling 911 or calling your MD immediately  if symptoms less severe.  You must read complete instructions/literature along with all the possible adverse reactions/side effects for all the Medicines you take and that have been prescribed to you. Take any new Medicines after you have completely understood and accpet all the possible adverse reactions/side effects.   Do not drive when taking Pain medications or sleeping medications (Benzodaizepines)  Do not take more than prescribed Pain, Sleep and Anxiety Medications. It is not advisable to combine anxiety,sleep and pain medications without talking with your primary care practitioner  Special Instructions: If you have smoked or chewed Tobacco  in the last 2 yrs please stop smoking, stop any regular Alcohol  and or any Recreational drug use.  Wear Seat belts while driving.  Please note: You were cared for by a hospitalist during your hospital stay. Once you are discharged, your primary care physician will handle any further medical issues. Please note that NO REFILLS for any discharge medications will be authorized once you are discharged, as it is imperative that you return to your primary care physician (or establish a relationship with a primary care physician if you do not have one) for your post hospital discharge needs so that they can reassess your need for medications and monitor your lab values.  Total Time spent coordinating discharge including counseling,  education and face to face time equals greater than 30 minutes.  SignedOren Binet 05/31/2021 8:54 AM

## 2021-05-31 NOTE — Consult Note (Signed)
WOC consulted regarding Unna's boot LLE. See previous consult note. WOC to assess area under Unna's boot with scheduled change tomorrow. Contacted [facility], Dr. Rogelia Rohrer performed MOHS procedure to verify need for continued compression and wound care.   Verified with [facility] orders for compression and topical care needed.  Updated orders and spoke with bedside nursing on need to remove Unna's boot and topical care needed  Will add Unna's boot orders for 1x wk   Patient will need follow up with [facility] upon DC, they are aware of the change of the compression today [place], [place], [place]

## 2021-06-01 ENCOUNTER — Encounter (HOSPITAL_COMMUNITY): Payer: Self-pay | Admitting: Physical Medicine & Rehabilitation

## 2021-06-01 DIAGNOSIS — N1832 Chronic kidney disease, stage 3b: Secondary | ICD-10-CM | POA: Diagnosis not present

## 2021-06-01 DIAGNOSIS — D649 Anemia, unspecified: Secondary | ICD-10-CM | POA: Diagnosis not present

## 2021-06-01 DIAGNOSIS — I63512 Cerebral infarction due to unspecified occlusion or stenosis of left middle cerebral artery: Secondary | ICD-10-CM | POA: Diagnosis not present

## 2021-06-01 DIAGNOSIS — S81802D Unspecified open wound, left lower leg, subsequent encounter: Secondary | ICD-10-CM | POA: Diagnosis not present

## 2021-06-01 DIAGNOSIS — I2510 Atherosclerotic heart disease of native coronary artery without angina pectoris: Secondary | ICD-10-CM

## 2021-06-01 LAB — CBC WITH DIFFERENTIAL/PLATELET
Abs Immature Granulocytes: 0.02 10*3/uL (ref 0.00–0.07)
Basophils Absolute: 0 10*3/uL (ref 0.0–0.1)
Basophils Relative: 1 %
Eosinophils Absolute: 0.1 10*3/uL (ref 0.0–0.5)
Eosinophils Relative: 2 %
HCT: 28.9 % — ABNORMAL LOW (ref 39.0–52.0)
Hemoglobin: 9.1 g/dL — ABNORMAL LOW (ref 13.0–17.0)
Immature Granulocytes: 0 %
Lymphocytes Relative: 18 %
Lymphs Abs: 1.2 10*3/uL (ref 0.7–4.0)
MCH: 28.8 pg (ref 26.0–34.0)
MCHC: 31.5 g/dL (ref 30.0–36.0)
MCV: 91.5 fL (ref 80.0–100.0)
Monocytes Absolute: 0.7 10*3/uL (ref 0.1–1.0)
Monocytes Relative: 11 %
Neutro Abs: 4.5 10*3/uL (ref 1.7–7.7)
Neutrophils Relative %: 68 %
Platelets: 160 10*3/uL (ref 150–400)
RBC: 3.16 MIL/uL — ABNORMAL LOW (ref 4.22–5.81)
RDW: 17.1 % — ABNORMAL HIGH (ref 11.5–15.5)
WBC: 6.5 10*3/uL (ref 4.0–10.5)
nRBC: 0 % (ref 0.0–0.2)

## 2021-06-01 LAB — COMPREHENSIVE METABOLIC PANEL
ALT: 15 U/L (ref 0–44)
AST: 18 U/L (ref 15–41)
Albumin: 2.7 g/dL — ABNORMAL LOW (ref 3.5–5.0)
Alkaline Phosphatase: 100 U/L (ref 38–126)
Anion gap: 8 (ref 5–15)
BUN: 36 mg/dL — ABNORMAL HIGH (ref 8–23)
CO2: 27 mmol/L (ref 22–32)
Calcium: 9.5 mg/dL (ref 8.9–10.3)
Chloride: 105 mmol/L (ref 98–111)
Creatinine, Ser: 1.89 mg/dL — ABNORMAL HIGH (ref 0.61–1.24)
GFR, Estimated: 33 mL/min — ABNORMAL LOW (ref >60)
Glucose, Bld: 108 mg/dL — ABNORMAL HIGH (ref 70–99)
Potassium: 3.6 mmol/L (ref 3.5–5.1)
Sodium: 140 mmol/L (ref 135–145)
Total Bilirubin: 0.4 mg/dL (ref 0.3–1.2)
Total Protein: 6.2 g/dL — ABNORMAL LOW (ref 6.5–8.1)

## 2021-06-01 MED ORDER — TORSEMIDE 20 MG PO TABS
40.0000 mg | ORAL_TABLET | Freq: Every day | ORAL | Status: DC
  Administered 2021-06-03 – 2021-06-08 (×6): 40 mg via ORAL
  Filled 2021-06-01 (×6): qty 2

## 2021-06-01 MED ORDER — DENOSUMAB 60 MG/ML ~~LOC~~ SOSY
60.0000 mg | PREFILLED_SYRINGE | Freq: Once | SUBCUTANEOUS | Status: AC
Start: 2021-06-01 — End: 2021-06-01
  Administered 2021-06-01: 60 mg via SUBCUTANEOUS
  Filled 2021-06-01 (×2): qty 1

## 2021-06-01 NOTE — Evaluation (Signed)
Speech Language Pathology Assessment and Plan  Patient Details  Name: [name] [name] MRN: [id number] Date of Birth: September 30, 1926  SLP Diagnosis: Speech and Language deficits;Apraxia  Rehab Potential: Good ELOS: 2-2.5 weeks   Today's Date: 06/01/2021 SLP Individual Time: 0800-0900 SLP Individual Time Calculation (min): 60 min  Hospital Problem: Principal Problem:   Left middle cerebral artery stroke [facility])  Past Medical History:  Past Medical History:  Diagnosis Date   AAA (abdominal aortic aneurysm) (HCC)    Arthritis    Ascending aorta dilatation ([place]) 03/13/2012   Fusiform dilatation of the ascending thoracic aorta discovered during surgery, max diameter 4.2-4.3 cm   BPH (benign prostatic hypertrophy)    Chronic systolic heart failure ([place]) 03/03/2012   Chronic venous insufficiency    GERD (gastroesophageal reflux disease)    Glaucoma    Gout    History of kidney stones    Hyperlipidemia    Hypertension    Obesity (BMI 30-39.9)    Paroxysmal atrial fibrillation ([place]) 02/29/2012   Recurrent paroxysmal, new-onset    PMR (polymyalgia rheumatica) (HCC)    Polymyalgia rheumatica ([place]) 02/06/2019   Pre-diabetes    S/P CABG x 3 03/06/2012   LIMA to LAD, SVG to OM, SVG to RCA, EVH via right thigh   S/P Maze operation for atrial fibrillation 03/06/2012   Complete bilateral lesions set using bipolar radiofrequency and cryothermy ablation with clipping of LA appendage   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   s/p TAVR with a 26 mm Edwards via the left subclavian by Drs Buena Irish and Bartle   Severe aortic stenosis 09/29/2019   Sleep apnea    USES CPAP NIGHTLY   Past Surgical History:  Past Surgical History:  Procedure Laterality Date   ABDOMINAL AORTOGRAM W/LOWER EXTREMITY N/A 10/26/2020   Procedure: ABDOMINAL AORTOGRAM W/LOWER EXTREMITY;  Surgeon: Broadus John, MD;  Location: [place] CV LAB;  Service: Cardiovascular;  Laterality: N/A;   CARDIAC  CATHETERIZATION     CATARACT EXTRACTION, BILATERAL     with lens implants   CHOLECYSTECTOMY N/A 03/24/2012   Procedure: LAPAROSCOPIC CHOLECYSTECTOMY WITH INTRAOPERATIVE CHOLANGIOGRAM;  Surgeon: Zenovia Jarred, MD;  Location: [place];  Service: General;  Laterality: N/A;   CORONARY ARTERY BYPASS GRAFT N/A 03/06/2012   Procedure: CORONARY ARTERY BYPASS GRAFTING (CABG);  Surgeon: Rexene Alberts, MD;  Location: [place];  Service: Open Heart Surgery;  Laterality: N/A;   ENDOVEIN HARVEST OF GREATER SAPHENOUS VEIN Right 03/06/2012   Procedure: ENDOVEIN HARVEST OF GREATER SAPHENOUS VEIN;  Surgeon: Rexene Alberts, MD;  Location: [place];  Service: Open Heart Surgery;  Laterality: Right;   INTRAOPERATIVE TRANSESOPHAGEAL ECHOCARDIOGRAM N/A 03/06/2012   Procedure: INTRAOPERATIVE TRANSESOPHAGEAL ECHOCARDIOGRAM;  Surgeon: Rexene Alberts, MD;  Location: [place];  Service: Open Heart Surgery;  Laterality: N/A;   IR KYPHO LUMBAR INC FX REDUCE BONE BX UNI/BIL CANNULATION INC/IMAGING  02/23/2020   LEFT HEART CATH Right 03/02/2012   Procedure: LEFT HEART CATH;  Surgeon: Sherren Mocha, MD;  Location: [facility] CATH LAB;  Service: Cardiovascular;  Laterality: Right;   MAZE N/A 03/06/2012   Procedure: MAZE;  Surgeon: Rexene Alberts, MD;  Location: [place];  Service: Open Heart Surgery;  Laterality: N/A;   PERIPHERAL VASCULAR INTERVENTION Right 10/26/2020   Procedure: PERIPHERAL VASCULAR INTERVENTION;  Surgeon: Broadus John, MD;  Location: [place] CV LAB;  Service: Cardiovascular;  Laterality: Right;   PROSTATECTOMY     partial   RIGHT/LEFT HEART CATH AND CORONARY/GRAFT  ANGIOGRAPHY N/A 09/29/2019   Procedure: RIGHT/LEFT HEART CATH AND CORONARY/GRAFT ANGIOGRAPHY;  Surgeon: Adrian Prows, MD;  Location: [place] CV LAB;  Service: Cardiovascular;  Laterality: N/A;   TEE WITHOUT CARDIOVERSION N/A 11/10/2019   Procedure: TRANSESOPHAGEAL ECHOCARDIOGRAM (TEE);  Surgeon: Burnell Blanks, MD;  Location: [place];  Service: Open Heart  Surgery;  Laterality: N/A;   TRANSCATHETER AORTIC VALVE REPLACEMENT, TRANSFEMORAL  11/10/2019    Assessment / Plan / Recommendation Clinical Impression [name] [name] is a 88 year old right-handed male with history of AAA, CAD with CABG, compression fractures, chronic anemia followed by hematology services, chronic systolic congestive heart failure, A-fib/AF status post MAZE on Xarelto, hypertension, hyperlipidemia, PMR maintained on steroids, prediabetes, early dementia maintained on Namenda, chronic lower extremity wounds with Unna boot OSA with CPAP, PAD with lymphedema status post balloon angioplasty of posterior tibial artery October 2022 recent UTI placed on antibiotic therapy.  Per chart review lives with spouse. He does have a personal care attendant Monday Wednesday Friday for 3 hours.  Modified independent transfers power wheelchair bound x3 to 4 months prior to that ambulating short distances with a rollator.  Present 05/25/2021 with right-sided weakness and aphasia of acute onset.  Cranial CT scan showed small left MCA territory cortical infarct within the mid to posterior left frontal lobe.  CT angiogram head and neck no large vessel occlusion.  MRI identified small areas of acute infarct right occipital lobe left frontal lobe over the convexity.  Mild to moderate acute infarct left operculum.  Possible cerebral emboli.  Patient did not receive tPA.  He completed a course of Omnicef for diagnosed UTI prior to hospitalization.  Tolerating regular diet.  Therapy evaluations completed due to patient's right-sided weakness and aphasia was admitted for a comprehensive rehab program. He reports chronic pain pain due to compression fracture, followed by orthopaedics.   Patient presents with motor speech deficits likely consistent with apraxia of speech. Some speech errors were suggestive of possible fluency disorder however suspect speech errors attributed to motor planning/sequencing deficits rather than  fluency disorder. Oral motor exam appeared notable for slight L facial weakness otherwise WFL. Pt exhibited frequent groping for words, increased difficulty diadochokinetic assessment, and difficulty producing multisyllabic words. Patient demonstrated awareness to errors and was able to self correct during ~50% of occasions by reducing rate of speech and separating syllables and words however pt expressed this is very effortful and challenging. Expression/receptive language skills appear WFL. Cognition appeared grossly intact based on informal assessment however pt reported "I can't concentrate like I used to." Pt endorsed memory deficits at baseline. H&P notable for "early dementia." Unable to formally assess cognition due to time constraints however recommend further testing in future sessions. Pt is tolerating a regular diet with thin liquids. Patient would benefit from skilled SLP intervention to maximize functional speech skills and further cognitive assessment prior to discharge.   Skilled Therapeutic Interventions          Pt participated in assessments of cognitive-linguistic, speech, and language function. Please see above.     SLP Assessment  Patient will need skilled [facility] during CIR admission    Recommendations  Patient destination: Home Follow up Recommendations: Home Health SLP;Outpatient SLP Equipment Recommended: None recommended by SLP    SLP Frequency 3 to 5 out of 7 days   SLP Duration  SLP Intensity  SLP Treatment/Interventions 2-2.5 weeks  Minumum of 1-2 x/day, 30 to 90 minutes  Speech/Language facilitation;Cueing hierarchy;Patient/family education;Functional tasks;Therapeutic Activities  Pain    Prior Functioning Cognitive/Linguistic Baseline: Information not available Type of Home: House  Lives With: Spouse Available Help at Discharge: Family;Personal care attendant;Available PRN/intermittently 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, 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, systems Overall Cognitive Status: Within Functional Limits for tasks assessed Arousal/Alertness: Awake/alert Orientation Level: Oriented X4 Year: 2023 Month: June Day of Week: Correct Attention: Focused;Sustained;Selective Focused Attention: Appears intact Sustained Attention: Appears intact (Appeared intact during assessment; pt endorsed acute attention changes) Selective Attention: Appears intact Memory: Impaired Memory Impairment: Decreased recall of new information Awareness: Appears intact Problem Solving: Appears intact Safety/Judgment: Appears intact  Comprehension Auditory Comprehension Overall Auditory Comprehension: Appears within functional limits for tasks assessed Expression Verbal Expression Overall Verbal Expression: Appears within functional limits for tasks assessed Initiation: No impairment Level of Generative/Spontaneous Verbalization: Sentence;Conversation Repetition: Impaired (due to motor speech deficits) Level of Impairment: Word level Naming: No impairment Pragmatics: No impairment Oral Motor Oral Motor/Sensory Function Overall Oral Motor/Sensory Function: Mild impairment Facial ROM: Reduced left Facial Symmetry: Abnormal symmetry left Facial Strength: Within Functional Limits Facial Sensation: Within Functional Limits Lingual ROM: Within Functional Limits Lingual Symmetry: Within Functional Limits Lingual Strength: Within Functional Limits Lingual Sensation: Within Functional Limits Velum: Within Functional Limits Mandible: Within Functional Limits Motor Speech Overall Motor Speech: Impaired Respiration: Within functional limits Phonation: Normal Resonance: Within functional limits Articulation: Within functional limitis Motor Planning: Impaired Level of Impairment: Word Motor Speech Errors: Aware;Groping for words;Inconsistent Effective Techniques: Slow rate;Pause  Care Tool Care  Tool Cognition Ability to hear (with hearing aid or hearing appliances if normally used Ability to hear (with hearing aid or hearing appliances if normally used): 1. Minimal difficulty - difficulty in some environments (e.g. when person speaks softly or setting is noisy)   Expression of Ideas and Wants Expression of Ideas and Wants: 3. Some difficulty - exhibits some difficulty with expressing needs and ideas (e.g, some words or finishing thoughts) or speech is not clear   Understanding Verbal and Non-Verbal Content Understanding Verbal and Non-Verbal Content: 4. Understands (complex and basic) - clear comprehension without cues or repetitions  Memory/Recall Ability Memory/Recall Ability : Current season;Location of own room;That he or she is in a hospital/hospital unit    Short Term Goals: Week 1: SLP Short Term Goal 1 (Week 1): Patient will participate in further cognitive-linguistic evaluation SLP Short Term Goal 2 (Week 1): Patient will produce functional multisyllabic words with implementation of speech strategies (slow rate, seperate syllables) during 75% of occasions with min-to-mod A clinicial cueing/modeling SLP Short Term Goal 3 (Week 1): Patient will use appropriate articulatory accuracy and speech rate when verbally reading/speaking with 50% accuracy given mod verbal cues/modeling.  Refer to Care Plan for Long Term Goals  Recommendations for other services: None   Discharge Criteria: Patient will be discharged from SLP if patient refuses treatment 3 consecutive times without medical reason, if treatment goals not met, if there is a change in medical status, if patient makes no progress towards goals or if patient is discharged from hospital.  The above assessment, treatment plan, treatment alternatives and goals were discussed and mutually agreed upon: by patient  [name] 06/01/2021, 7:12 PM

## 2021-06-01 NOTE — Progress Notes (Signed)
PROGRESS NOTE   Subjective/Complaints: No new complaints or concerns this AM.   Review of Systems  Constitutional:  Negative for fever.  Respiratory:  Negative for shortness of breath.   Cardiovascular:  Negative for chest pain.  Gastrointestinal:  Negative for abdominal pain, constipation, nausea and vomiting.     Objective:   No results found. Recent Labs    06/01/21 0529  WBC 6.5  HGB 9.1*  HCT 28.9*  PLT 160   Recent Labs    05/30/21 0448 06/01/21 0529  NA  --  140  K  --  3.6  CL  --  105  CO2  --  27  GLUCOSE  --  108*  BUN  --  36*  CREATININE 1.70* 1.89*  CALCIUM  --  9.5    Intake/Output Summary (Last 24 hours) at 06/01/2021 0727 Last data filed at 06/01/2021 0044 Gross per 24 hour  Intake --  Output 1050 ml  Net -1050 ml     Pressure Injury 05/26/21 Heel Right Stage 3 -  Full thickness tissue loss. Subcutaneous fat may be visible but bone, tendon or muscle are NOT exposed. (Active)  05/26/21   Location: Heel  Location Orientation: Right  Staging: Stage 3 -  Full thickness tissue loss. Subcutaneous fat may be visible but bone, tendon or muscle are NOT exposed.  Wound Description (Comments):   Present on Admission: Yes     Pressure Injury 05/31/21 Ankle Posterior;Right Stage 3 -  Full thickness tissue loss. Subcutaneous fat may be visible but bone, tendon or muscle are NOT exposed. (Active)  05/31/21 1647  Location: Ankle  Location Orientation: Posterior;Right  Staging: Stage 3 -  Full thickness tissue loss. Subcutaneous fat may be visible but bone, tendon or muscle are NOT exposed.  Wound Description (Comments):   Present on Admission: Yes    Physical Exam: Vital Signs Blood pressure 115/66, pulse 67, temperature 97.7 F (36.5 C), resp. rate 18, height 6' (1.829 m), weight 98.2 kg, SpO2 97 %.    Assessment/Plan: 1. Functional deficits which require 3+ hours per day of interdisciplinary  therapy in a comprehensive inpatient rehab setting. Physiatrist is providing close team supervision and 24 hour management of active medical problems listed below. Physiatrist and rehab team continue to assess barriers to discharge/monitor patient progress toward functional and medical goals  Care Tool:  Bathing              Bathing assist       Upper Body Dressing/Undressing Upper body dressing        Upper body assist      Lower Body Dressing/Undressing Lower body dressing            Lower body assist       Toileting Toileting    Toileting assist Assist for toileting: Minimal Assistance - Patient > 75% (bedpan)     Transfers Chair/bed transfer  Transfers assist           Locomotion Ambulation   Ambulation assist              Walk 10 feet activity   Assist  Walk 50 feet activity   Assist           Walk 150 feet activity   Assist           Walk 10 feet on uneven surface  activity   Assist           Wheelchair     Assist               Wheelchair 50 feet with 2 turns activity    Assist            Wheelchair 150 feet activity     Assist          Blood pressure 115/66, pulse 67, temperature 97.7 F (36.5 C), resp. rate 18, height 6' (1.829 m), weight 98.2 kg, SpO2 97 %.  General: Alert and oriented x 3, No apparent distress, sitting in chair HEENT:, oral mucosa pink and moist Neck: Supple  Heart: Reg rate and rhythm. No murmurs rubs or gallops Chest: CTA bilaterally without wheezes, rales, or rhonchi; no distress Abdomen: Soft, mildly tender, non-distended, bowel sounds positive. Extremities: No clubbing, cyanosis, or edema.  He has small wounds on his right thigh, dorsal foot covered in dressing, R heel wound,  he has a wrapped clean wound on his left lower extremity from a Mohs surgery, bruising on dorsal bilateral upper extremities Psych: Pt's affect is appropriate. Pt  is cooperative Skin: warm and dry, as above Neuro: Alert and oriented x4, cranial nerves II through XII intact ,follows commands, able to name 2 out of 2 objects, able to repeat 3 out of 3 words, he does have expressive aphasia and dysarthria, delayed processing, comprehension appears to be normal ,sensation altered in his right upper extremity to light touch, finger-nose normal bilaterally Musculoskeletal: No joint redness or swelling noted Strength 5 out of 5 in bilateral upper extremities 4 out of 5 bilateral hip flexion 5 out of 5 bilateral knee extension Right ankle PF and DF 2 out of 5, left ankle PF 2 out of 5, DF 1 out of 5.  Patient reports this is chronic possibly due to vascular disease Tone is normal No joint swelling noted    Medical Problem List and Plan: 1. Functional deficits secondary to left MCA embolic CVA             -patient may shower             -ELOS/Goals: 16-18 days PT/OT Min A wheelchair level; SLP Mod I  -PT/OT/SLP eval 2.  Antithrombotics: -DVT/anticoagulation:  Pharmaceutical: Other (comment) Eliquis now, previously on xarelto             -antiplatelet therapy: N/A 3. Pain Management: Lidoderm patch, Neurontin 200 mg twice daily and 100 mg nightly, tramadol as needed 4. Mood: Namenda 10 mg twice daily, Razadyne 8 mg daily             -antipsychotic agents: N/A 5. Neuropsych: This patient is capable of making decisions on his own behalf. 6. Skin/Wound Care: Routine skin checks.  Chronic lower extremity wounds. LLE wound from Mohs procedure. Follow-up wound care nurse -Consult for unna boot replacement 7. Fluids/Electrolytes/Nutrition: Routine in and outs with follow-up chemistries 8.  Atrial fibrillation.  Amiodarone 200 mg daily.  Continue Eliquis.  Cardiac rate controlled 9.  CAD/CABG 2014.  No chest pain or shortness of breath.Continue Imdur 60 mg daily -Denies chest pain 10.  History of severe aortic stenosis status post TAVR procedure 2021.  Follow-up  cardiology  services 11.  History of PMR.  Currently maintained on ARAVA 10 mg daily as well as chronic prednisone 3 mg daily 12.  BPH.  Proscar 5 mg daily.  Check PVR, Has been using external catheter 13.  Hyperlipidemia.  Lipitor/Zetia 14.  Chronic systolic congestive heart failure.  Demadex 40 mg daily. Imdur '60mg'$  daily. Monitor for signs of fluid overload 15.  OSA.  CPAP 16.  GERD.  Protonix 17.  PAD with lymphedema status post balloon angioplasty of posterior tibial artery October 2022.  Follow-up outpatient 18.  Chronic anemia.  Followed by hematology services.  He had been receiving Epogen injections as an outpatient.             -6/1 Hgb 9.1 19. Dementia, continue galantamine and memantine 20. CKD IIIB, avoid nephrotoxic medications Lab Results  Component Value Date   CREATININE 1.89 (H) 06/01/2021   CREATININE 1.70 (H) 05/30/2021   CREATININE 1.52 (H) 05/28/2021  6/1 Cr increased, repeat tomorrow, hold torsemide 21. HTN continue imdur    LOS: 1 days A FACE TO FACE EVALUATION WAS PERFORMED  Jennye Boroughs 06/01/2021, 7:27 AM

## 2021-06-01 NOTE — Progress Notes (Signed)
Patient ID: [name] [name], male   DOB: 21-Jul-1926, 88 y.o.   MRN: [id number] Met with the patient and wife to review rehab process, team conference and plan of care. Wife noted unna boot left leg applied post procedure about a week ago and needs to be changed today; deferred to [facility]. Also changed dressings to right heel/ankle every other day PTA p bathing.  New onset hematuria noted; patient noted hx since prostate issues however recently changed to Eliquis. PAC and pharmacy notified of assessment.  Patient reports back pain and medication is helpful however does not alleviate source of pain.  Discussed medications and dietary modification recommendations post stroke. Wife asking about Prolia injection; last dose over due and he usually comes into the hospital for administration; asking if he can have it administered while on Rehab; MD made aware of request. Continue to follow along to discharge to address educational needs to facilitate preparation for discharge home with wife. [name] [name]

## 2021-06-01 NOTE — Progress Notes (Signed)
Pt has home CPAP at bedside. Water chamber refilled, and machine slid over within pt's reach. Advised pt to notify for RT if any further assistance is needed.

## 2021-06-01 NOTE — Progress Notes (Signed)
Orthopedic Tech Progress Note Patient Details:  [name] [name] 1926-07-07 [id number]  Ortho Devices Type of Ortho Device: Louretta Parma boot Ortho Device/Splint Location: LLE Ortho Device/Splint Interventions: Ordered, Application   Post Interventions Patient Tolerated: Well  Sheli Dorin A Clayten Allcock 06/01/2021, 3:30 PM

## 2021-06-01 NOTE — Discharge Instructions (Addendum)
Inpatient Rehab Discharge Instructions  [name] [name] Discharge date and time: No discharge date for patient encounter.   Activities/Precautions/ Functional Status: Activity: activity as tolerated Diet: regular diet Wound Care: Routine skin checks Functional status:  ___ No restrictions     ___ Walk up steps independently ___ 24/7 supervision/assistance   ___ Walk up steps with assistance ___ Intermittent supervision/assistance  ___ Bathe/dress independently ___ Walk with walker     _x__ Bathe/dress with assistance ___ Walk Independently    ___ Shower independently ___ Walk with assistance    ___ Shower with assistance ___ No alcohol     ___ Return to work/school ________  Special Instructions: No driving smoking or alcohol  Topical care to left lower extremity apply Vaseline gauze to surgical wounds and other wounds of the left lower extremity when Unna boots changed weekly  Foam dressing to left toenail, right thigh.  Change every 3 days or as needed soiling.  Cut small piece of Aquacel applied to right outer ankle and cover with foam, change every Monday Wednesday Friday.  The left lower extremity Unna boot in place to be changed per [employer] orders.  Leave right heel cups in mesh to hold in place to right heel with compression stocking in place do not throw away  Change Unna boots left lower extremities as directed.   STROKE/TIA DISCHARGE INSTRUCTIONS SMOKING Cigarette smoking nearly doubles your risk of having a stroke & is the single most alterable risk factor  If you smoke or have smoked in the last 12 months, you are advised to quit smoking for your health. Most of the excess cardiovascular risk related to smoking disappears within a year of stopping. Ask you doctor about anti-smoking medications [place] Quit Line: 1-800-QUIT NOW Free Smoking Cessation Classes [phone]  CHOLESTEROL Know your levels; limit fat & cholesterol in your diet  Lipid Panel     Component Value  Date/Time   CHOL 224 (H) 05/25/2021 1124   TRIG 146 05/25/2021 1124   HDL 73 05/25/2021 1124   CHOLHDL 3.1 05/25/2021 1124   VLDL 29 05/25/2021 1124   LDLCALC 122 (H) 05/25/2021 1124     Many patients benefit from treatment even if their cholesterol is at goal. Goal: Total Cholesterol (CHOL) less than 160 Goal:  Triglycerides (TRIG) less than 150 Goal:  HDL greater than 40 Goal:  LDL (LDLCALC) less than 100   BLOOD PRESSURE American Stroke Association blood pressure target is less that 120/80 mm/Hg  Your discharge blood pressure is:  BP: 115/66 Monitor your blood pressure Limit your salt and alcohol intake Many individuals will require more than one medication for high blood pressure  DIABETES (A1c is a blood sugar average for last 3 months) Goal HGBA1c is under 7% (HBGA1c is blood sugar average for last 3 months)  Diabetes:    Lab Results  Component Value Date   HGBA1C 5.8 (H) 05/25/2021    Your HGBA1c can be lowered with medications, healthy diet, and exercise. Check your blood sugar as directed by your physician Call your physician if you experience unexplained or low blood sugars.  PHYSICAL ACTIVITY/REHABILITATION Goal is 30 minutes at least 4 days per week  Activity: Increase activity slowly, Therapies: Physical Therapy: Home Health Return to work:  Activity decreases your risk of heart attack and stroke and makes your heart stronger.  It helps control your weight and blood pressure; helps you relax and can improve your mood. Participate in a regular exercise program. Talk  with your doctor about the best form of exercise for you (dancing, walking, swimming, cycling).  DIET/WEIGHT Goal is to maintain a healthy weight  Your discharge diet is:  Diet Order             Diet Heart Room service appropriate? Yes; Fluid consistency: Thin  Diet effective now                   liquids Your height is:  Height: 6' (182.9 cm) Your current weight is: Weight: 98.2 kg Your Body  Mass Index (BMI) is:  BMI (Calculated): 29.36 Following the type of diet specifically designed for you will help prevent another stroke. Your goal weight range is:   Your goal Body Mass Index (BMI) is 19-24. Healthy food habits can help reduce 3 risk factors for stroke:  High cholesterol, hypertension, and excess weight.  RESOURCES Stroke/Support Group:  Call [phone]   STROKE EDUCATION PROVIDED/REVIEWED AND GIVEN TO PATIENT Stroke warning signs and symptoms How to activate emergency medical system (call 911). Medications prescribed at discharge. Need for follow-up after discharge. Personal risk factors for stroke. Pneumonia vaccine given: No Flu vaccine given: No My questions have been answered, the writing is legible, and I understand these instructions.  I will adhere to these goals & educational materials that have been provided to me after my discharge from the hospital.       My questions have been answered and I understand these instructions. I will adhere to these goals and the provided educational materials after my discharge from the hospital.  Patient/Caregiver Signature _______________________________ Date __________  Clinician Signature _______________________________________ Date __________  Please bring this form and your medication list with you to all your follow-up doctor's appointments.      Information on my medicine - ELIQUIS (apixaban)  This medication education was reviewed with me or my healthcare representative as part of my discharge preparation.  Why was Eliquis prescribed for you? Eliquis was prescribed for you to reduce the risk of a blood clot forming that can cause a stroke if you have a medical condition called atrial fibrillation (a type of irregular heartbeat).  What do You need to know about Eliquis ? Take your Eliquis TWICE DAILY - one tablet in the morning and one tablet in the evening with or without food. If you have difficulty  swallowing the tablet whole please discuss with your pharmacist how to take the medication safely.  Take Eliquis exactly as prescribed by your doctor and DO NOT stop taking Eliquis without talking to the doctor who prescribed the medication.  Stopping may increase your risk of developing a stroke.  Refill your prescription before you run out.  After discharge, you should have regular check-up appointments with your healthcare provider that is prescribing your Eliquis.  In the future your dose may need to be changed if your kidney function or weight changes by a significant amount or as you get older.  What do you do if you miss a dose? If you miss a dose, take it as soon as you remember on the same day and resume taking twice daily.  Do not take more than one dose of ELIQUIS at the same time to make up a missed dose.  Important Safety Information A possible side effect of Eliquis is bleeding. You should call your healthcare provider right away if you experience any of the following: Bleeding from an injury or your nose that does not stop. Unusual colored urine (red or  dark brown) or unusual colored stools (red or black). Unusual bruising for unknown reasons. A serious fall or if you hit your head (even if there is no bleeding).  Some medicines may interact with Eliquis and might increase your risk of bleeding or clotting while on Eliquis. To help avoid this, consult your healthcare provider or pharmacist prior to using any new prescription or non-prescription medications, including herbals, vitamins, non-steroidal anti-inflammatory drugs (NSAIDs) and supplements.  This website has more information on Eliquis (apixaban): http://www.eliquis.com/eliquis/home

## 2021-06-01 NOTE — Plan of Care (Signed)
  Problem: RH Balance Goal: LTG Patient will maintain dynamic sitting balance (PT) Description: LTG:  Patient will maintain dynamic sitting balance with assistance during mobility activities (PT) Flowsheets (Taken 06/01/2021 2331) LTG: Pt will maintain dynamic sitting balance during mobility activities with:: Independent with assistive device  Goal: LTG Patient will maintain dynamic standing balance (PT) Description: LTG:  Patient will maintain dynamic standing balance with assistance during mobility activities (PT) Flowsheets (Taken 06/01/2021 2331) LTG: Pt will maintain dynamic standing balance during mobility activities with:: Contact Guard/Touching assist   Problem: Sit to Stand Goal: LTG:  Patient will perform sit to stand with assistance level (PT) Description: LTG:  Patient will perform sit to stand with assistance level (PT) Flowsheets (Taken 06/01/2021 2331) LTG: PT will perform sit to stand in preparation for functional mobility with assistance level: Supervision/Verbal cueing   Problem: RH Bed Mobility Goal: LTG Patient will perform bed mobility with assist (PT) Description: LTG: Patient will perform bed mobility with assistance, with/without cues (PT). Flowsheets (Taken 06/01/2021 2331) LTG: Pt will perform bed mobility with assistance level of: Supervision/Verbal cueing   Problem: RH Bed to Chair Transfers Goal: LTG Patient will perform bed/chair transfers w/assist (PT) Description: LTG: Patient will perform bed to chair transfers with assistance (PT). Flowsheets (Taken 06/01/2021 2331) LTG: Pt will perform Bed to Chair Transfers with assistance level: Supervision/Verbal cueing   Problem: RH Car Transfers Goal: LTG Patient will perform car transfers with assist (PT) Description: LTG: Patient will perform car transfers with assistance (PT). Flowsheets (Taken 06/01/2021 2331) LTG: Pt will perform car transfers with assist:: Contact Guard/Touching assist   Problem: RH  Ambulation Goal: LTG Patient will ambulate in controlled environment (PT) Description: LTG: Patient will ambulate in a controlled environment, # of feet with assistance (PT). Flowsheets (Taken 06/01/2021 2331) LTG: Pt will ambulate in controlled environ  assist needed:: Contact Guard/Touching assist LTG: Ambulation distance in controlled environment: 51f using LRAD Goal: LTG Patient will ambulate in home environment (PT) Description: LTG: Patient will ambulate in home environment, # of feet with assistance (PT). Flowsheets (Taken 06/01/2021 2331) LTG: Pt will ambulate in home environ  assist needed:: Contact Guard/Touching assist LTG: Ambulation distance in home environment: 227fusing LRAD   Problem: RH Wheelchair Mobility Goal: LTG Patient will propel w/c in controlled environment (PT) Description: LTG: Patient will propel wheelchair in controlled environment, # of feet with assist (PT) Flowsheets (Taken 06/01/2021 2331) LTG: Pt will propel w/c in controlled environ  assist needed:: Supervision/Verbal cueing LTG: Propel w/c distance in controlled environment: [id number]: LTG Patient will propel w/c in home environment (PT) Description: LTG: Patient will propel wheelchair in home environment, # of feet with assistance (PT). Flowsheets (Taken 06/01/2021 2331) LTG: Pt will propel w/c in home environ  assist needed:: Supervision/Verbal cueing Distance: wheelchair distance in controlled environment: 150

## 2021-06-01 NOTE — Progress Notes (Signed)
Inpatient Rehabilitation  Patient information reviewed and entered into eRehab system by Gabor Lusk M. Mariaclara Spear, M.A., CCC/SLP, PPS Coordinator.  Information including medical coding, functional ability and quality indicators will be reviewed and updated through discharge.    

## 2021-06-01 NOTE — Evaluation (Signed)
Occupational Therapy Assessment and Plan  Patient Details  Name: [name] [name] MRN: [id number] Date of Birth: 1926/12/16  OT Diagnosis: abnormal posture, ataxia, disturbance of vision, hemiplegia affecting dominant side, muscle weakness (generalized), and chronic pain, fatigue, decreased postural control and activity tolerance Rehab Potential: Rehab Potential (ACUTE ONLY): Fair ELOS: 2-2.5 weeks   Today's Date: 06/01/2021 OT Individual Time: 1105-1200 OT Individual Time Calculation (min): 55 min     Hospital Problem: Principal Problem:   Left middle cerebral artery stroke [facility])   Past Medical History:  Past Medical History:  Diagnosis Date   AAA (abdominal aortic aneurysm) ([place])    Arthritis    Ascending aorta dilatation ([place]) 03/13/2012   Fusiform dilatation of the ascending thoracic aorta discovered during surgery, max diameter 4.2-4.3 cm   BPH (benign prostatic hypertrophy)    Chronic systolic heart failure ([place]) 03/03/2012   Chronic venous insufficiency    GERD (gastroesophageal reflux disease)    Glaucoma    Gout    History of kidney stones    Hyperlipidemia    Hypertension    Obesity (BMI 30-39.9)    Paroxysmal atrial fibrillation ([place]) 02/29/2012   Recurrent paroxysmal, new-onset    PMR (polymyalgia rheumatica) (HCC)    Polymyalgia rheumatica ([place]) 02/06/2019   Pre-diabetes    S/P CABG x 3 03/06/2012   LIMA to LAD, SVG to OM, SVG to RCA, EVH via right thigh   S/P Maze operation for atrial fibrillation 03/06/2012   Complete bilateral lesions set using bipolar radiofrequency and cryothermy ablation with clipping of LA appendage   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   s/p TAVR with a 26 mm Edwards via the left subclavian by Drs Buena Irish and Bartle   Severe aortic stenosis 09/29/2019   Sleep apnea    USES CPAP NIGHTLY   Past Surgical History:  Past Surgical History:  Procedure Laterality Date   ABDOMINAL AORTOGRAM W/LOWER EXTREMITY N/A  10/26/2020   Procedure: ABDOMINAL AORTOGRAM W/LOWER EXTREMITY;  Surgeon: Broadus John, MD;  Location: [place] CV [facility];  Service: Cardiovascular;  Laterality: N/A;   CARDIAC CATHETERIZATION     CATARACT EXTRACTION, BILATERAL     with lens implants   CHOLECYSTECTOMY N/A 03/24/2012   Procedure: LAPAROSCOPIC CHOLECYSTECTOMY WITH INTRAOPERATIVE CHOLANGIOGRAM;  Surgeon: Zenovia Jarred, MD;  Location: [place];  Service: General;  Laterality: N/A;   CORONARY ARTERY BYPASS GRAFT N/A 03/06/2012   Procedure: CORONARY ARTERY BYPASS GRAFTING (CABG);  Surgeon: Rexene Alberts, MD;  Location: [place];  Service: Open Heart Surgery;  Laterality: N/A;   ENDOVEIN HARVEST OF GREATER SAPHENOUS VEIN Right 03/06/2012   Procedure: ENDOVEIN HARVEST OF GREATER SAPHENOUS VEIN;  Surgeon: Rexene Alberts, MD;  Location: [place];  Service: Open Heart Surgery;  Laterality: Right;   INTRAOPERATIVE TRANSESOPHAGEAL ECHOCARDIOGRAM N/A 03/06/2012   Procedure: INTRAOPERATIVE TRANSESOPHAGEAL ECHOCARDIOGRAM;  Surgeon: Rexene Alberts, MD;  Location: [place];  Service: Open Heart Surgery;  Laterality: N/A;   IR KYPHO LUMBAR INC FX REDUCE BONE BX UNI/BIL CANNULATION INC/IMAGING  02/23/2020   LEFT HEART CATH Right 03/02/2012   Procedure: LEFT HEART CATH;  Surgeon: Sherren Mocha, MD;  Location: [facility] CATH LAB;  Service: Cardiovascular;  Laterality: Right;   MAZE N/A 03/06/2012   Procedure: MAZE;  Surgeon: Rexene Alberts, MD;  Location: [place];  Service: Open Heart Surgery;  Laterality: N/A;   PERIPHERAL VASCULAR INTERVENTION Right 10/26/2020   Procedure: PERIPHERAL VASCULAR INTERVENTION;  Surgeon: Broadus John, MD;  Location:  [place] INVASIVE CV LAB;  Service: Cardiovascular;  Laterality: Right;   PROSTATECTOMY     partial   RIGHT/LEFT HEART CATH AND CORONARY/GRAFT ANGIOGRAPHY N/A 09/29/2019   Procedure: RIGHT/LEFT HEART CATH AND CORONARY/GRAFT ANGIOGRAPHY;  Surgeon: Adrian Prows, MD;  Location: [place] CV LAB;  Service: Cardiovascular;   Laterality: N/A;   TEE WITHOUT CARDIOVERSION N/A 11/10/2019   Procedure: TRANSESOPHAGEAL ECHOCARDIOGRAM (TEE);  Surgeon: Burnell Blanks, MD;  Location: [place];  Service: Open Heart Surgery;  Laterality: N/A;   TRANSCATHETER AORTIC VALVE REPLACEMENT, TRANSFEMORAL  11/10/2019    Assessment & Plan Clinical Impression: Patient is a 88 y.o. year old male with history of AAA, CAD with CABG, compression fractures, chronic anemia followed by hematology services, chronic systolic congestive heart failure, A-fib/AF status post MAZE on Xarelto, hypertension, hyperlipidemia, PMR maintained on steroids, prediabetes, early dementia maintained on Namenda, chronic lower extremity wounds with Unna boot OSA with CPAP, PAD with lymphedema status post balloon angioplasty of posterior tibial artery October 2022 recent UTI placed on antibiotic therapy.  Per chart review lives with spouse. She is in the room during history and exam.  He does have a personal care attendant Monday Wednesday Friday for 3 hours.  Modified independent transfers power wheelchair bound x3 to 4 months prior to that ambulating short distances with a rollator.  Present 05/25/2021 with right-sided weakness and aphasia of acute onset.  Cranial CT scan showed small left MCA territory cortical infarct within the mid to posterior left frontal lobe.  CT angiogram head and neck no large vessel occlusion.  Multifocal severe stenosis within the right vertebral artery at the level of the skull base and intracranially.  MRI identified small areas of acute infarct right occipital lobe left frontal lobe over the convexity.  Mild to moderate acute infarct left operculum.  Possible cerebral emboli.  Patient did not receive tPA.  Echocardiogram ejection fraction of 65 to 70% no wall motion abnormalities.  Admission chemistries unremarkable except BUN 26 creatinine 1.71, hemoglobin 9.9, hemoglobin A1c 5.8, troponin 29-32.  Neurology follow-up currently maintained on  Eliquis patient had been on Xarelto prior to admission for atrial fibrillation.  He completed a course of Omnicef for diagnosed UTI prior to hospitalization.  Tolerating regular diet.  Therapy evaluations completed due to patient's right-sided weakness and aphasia was admitted for a comprehensive rehab program. He reports chronic pain pain due to compression fracture, followed by orthopaedics. .  Patient transferred to CIR on 05/31/2021 .    Patient currently requires overall mod with basic self-care skills secondary to muscle weakness, decreased cardiorespiratoy endurance, impaired timing and sequencing, unbalanced muscle activation, ataxia, and decreased coordination, and decreased sitting balance, decreased standing balance, decreased postural control, hemiplegia, and decreased balance strategies.  Prior to hospitalization, patient could complete UB  BADL +  and transfers with modified independent .  Patient will benefit from skilled intervention to decrease level of assist with basic self-care skills and increase independence with basic self-care skills prior to discharge home with care partner.  Anticipate patient will require intermittent supervision and follow up home health and follow up outpatient.  OT - End of Session Activity Tolerance: Tolerates 10 - 20 min activity with multiple rests Endurance Deficit: Yes Endurance Deficit Description: requires rest breaks in between ADL OT Assessment Rehab Potential (ACUTE ONLY): Fair OT Barriers to Discharge: Decreased caregiver support;Incontinence OT Patient demonstrates impairments in the following area(s): Balance;Endurance;Motor;Perception;Safety;Sensory;Skin Integrity OT Basic ADL's Functional Problem(s): Eating;Grooming;Bathing;Dressing;Toileting OT Transfers Functional Problem(s): Toilet;Tub/Shower OT Additional Impairment(s):  Fuctional Use of Upper Extremity OT Plan OT Intensity: Minimum of 1-2 x/day, 45 to 90 minutes OT Frequency: 5 out  of 7 days OT Duration/Estimated Length of Stay: 2-2.5 weeks OT Treatment/Interventions: Balance/vestibular training;Disease mangement/prevention;Neuromuscular re-education;Self Care/advanced ADL retraining;Therapeutic Exercise;Wheelchair propulsion/positioning;UE/LE Strength taining/ROM;Skin care/wound managment;DME/adaptive equipment instruction;Pain management;Community reintegration;Functional electrical stimulation;Patient/family education;Splinting/orthotics;UE/LE Coordination activities;Therapeutic Activities;Psychosocial support;Functional mobility training;Discharge planning OT Self Feeding Anticipated Outcome(s): set-up A OT Basic Self-Care Anticipated Outcome(s): S to mod I OT Toileting Anticipated Outcome(s): S OT Bathroom Transfers Anticipated Outcome(s): S OT Recommendation Patient destination: Home Follow Up Recommendations: Home health OT Equipment Recommended: To be determined   OT Evaluation Precautions/Restrictions  Precautions Precautions: Fall Precaution Comments: R hemiparesis Restrictions Weight Bearing Restrictions: No General Chart Reviewed: Yes Response to Previous Treatment: Patient with no complaints from previous session Family/Caregiver Present: Yes (wife present at beginning of session)  Home Living/Prior Functioning Home Living Available Help at Discharge: Family, Personal care attendant, Available PRN/intermittently (Personal aid 3x/week from 8-11) Type of Home: House Home Access: Elevator Home Layout: Able to live on main level with bedroom/bathroom Bathroom Shower/Tub: Multimedia programmer: Handicapped height Bathroom Accessibility: Yes  Lives With: Spouse IADL History Homemaking Responsibilities: No 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 Leisure and Hobbies: wood working Prior Function Level of Independence: [place]: Retired Surveyor, mining Baseline  Vision/History: 1 Wears glasses Ability to See in Adequate Light: 0 Adequate Patient Visual Report: Other (comment) (reports difficulty "concentrating" and visually attending) Vision Assessment?: No apparent visual deficits Perception  Perception: Impaired Inattention/Neglect: Does not attend to right side of body Praxis Praxis: Intact Cognition Cognition Overall Cognitive Status: Within Functional Limits for tasks assessed Arousal/Alertness: Awake/alert Orientation Level: Person;Place;Situation Person: Oriented Place: Oriented Situation: Oriented Memory: Impaired Memory Impairment: Decreased recall of new information Attention: Sustained Sustained Attention: Appears intact Awareness: Appears intact Problem Solving: Appears intact Safety/Judgment: Appears intact Brief Interview for Mental Status (BIMS) Repetition of Three Words (First Attempt): 3 Temporal Orientation: Year: Correct Temporal Orientation: Month: Accurate within 5 days Temporal Orientation: Day: Correct Recall: "Sock": No, could not recall Recall: "Blue": Yes, no cue required Recall: "Bed": Yes, no cue required BIMS Summary Score: 13 Sensation Sensation Light Touch: Impaired Detail Peripheral sensation comments: unable to appreciate LT to R digits/toes Light Touch Impaired Details: Impaired RUE;Impaired RLE Hot/Cold: Not tested Proprioception: Impaired by gross assessment Stereognosis: Impaired by gross assessment Additional Comments: R hemi with sensorimotor deficits Coordination Gross Motor Movements are Fluid and Coordinated: No Fine Motor Movements are Fluid and Coordinated: No Coordination and Movement Description: R hemi with sensorimotor deficits Finger Nose Finger Test: undershoots Motor  Motor Motor: Hemiplegia Motor - Skilled Clinical Observations: R hemi with sensorimotor deficits  Trunk/Postural Assessment  Cervical Assessment Cervical Assessment: Exceptions to [facility] (forward  head) Thoracic Assessment Thoracic Assessment: Exceptions to [facility] (rounded shoulders) Lumbar Assessment Lumbar Assessment: Exceptions to [facility] (posterior pelvic tilt) Postural Control Postural Control: Deficits on evaluation (posterior bias)  Balance Balance Balance Assessed: Yes Static Sitting Balance Static Sitting - Balance Support: Feet supported Static Sitting - Level of Assistance: 5: Stand by assistance Static Standing Balance Static Standing - Balance Support: Bilateral upper extremity supported;During functional activity Static Standing - Level of Assistance: 4: Min assist Dynamic Standing Balance Dynamic Standing - Balance Support: During functional activity;Bilateral upper extremity supported Dynamic Standing - Level of Assistance: 4: Min assist Extremity/Trunk Assessment RUE Assessment RUE Assessment: Exceptions to [facility] General Strength Comments: mild HP RUE Body System: Neuro Brunstrum levels  for arm and hand: Arm;Hand Brunstrum level for arm: Stage IV Movement is deviating from synergy Brunstrum level for hand: Stage V Independence from basic synergies LUE Assessment LUE Assessment: Within Functional Limits  Care Tool Care Tool Self Care Eating   Eating Assist Level: Minimal Assistance - Patient > 75%    Oral Care    Oral Care Assist Level: Minimal Assistance - Patient > 75%    Bathing   Body parts bathed by patient: Face;Right arm;Left arm;Chest;Abdomen     Assist Level: Minimal Assistance - Patient > 75%    Upper Body Dressing(including orthotics)   What is the patient wearing?: Pull over shirt;Hospital gown only   Assist Level: Minimal Assistance - Patient > 75%    Lower Body Dressing (excluding footwear)   What is the patient wearing?: Underwear/pull up;Pants Assist for lower body dressing: Total Assistance - Patient < 25%    Putting on/Taking off footwear   What is the patient wearing?: Non-skid slipper socks Assist for footwear: Total Assistance  - Patient < 25%       Care Tool Toileting Toileting activity Toileting Activity did not occur (Clothing management and hygiene only): N/A (no void or bm)       Care Tool Bed Mobility Roll left and right activity   Roll left and right assist level: Independent    Sit to lying activity        Lying to sitting on side of bed activity   Lying to sitting on side of bed assist level: the ability to move from lying on the back to sitting on the side of the bed with no back support.: Contact Guard/Touching assist     Care Tool Transfers Sit to stand transfer   Sit to stand assist level: Moderate Assistance - Patient 50 - 74%    Chair/bed transfer   Chair/bed transfer assist level: Minimal Assistance - Patient > 75%     Toilet transfer   Assist Level: Moderate Assistance - Patient 50 - 74%     Care Tool Cognition  Expression of Ideas and Wants Expression of Ideas and Wants: 3. Some difficulty - exhibits some difficulty with expressing needs and ideas (e.g, some words or finishing thoughts) or speech is not clear  Understanding Verbal and Non-Verbal Content Understanding Verbal and Non-Verbal Content: 4. Understands (complex and basic) - clear comprehension without cues or repetitions   Memory/Recall Ability Memory/Recall Ability : Current season;Location of own room;That he or she is in a hospital/hospital unit   Refer to Care Plan for [place] 1 OT Short Term Goal 1 (Week 1): Pt will don shirt with S. OT Short Term Goal 2 (Week 1): Pt will complete toilet transfer with min A. OT Short Term Goal 3 (Week 1): Pt will complete bathing with mod A at sink. OT Short Term Goal 4 (Week 1): Pt will position his RUE when in chair/bed with no more than mod VCs.  Recommendations for other services: Therapeutic Recreation  Pet therapy and Stress management   Skilled Therapeutic Intervention ADL ADL Eating: Minimal assistance Where Assessed-Eating: Bed  level Grooming: Minimal assistance Where Assessed-Grooming: Sitting at sink Upper Body Bathing: Minimal assistance Where Assessed-Upper Body Bathing: Edge of bed Lower Body Bathing: Moderate assistance Where Assessed-Lower Body Bathing: Edge of bed Upper Body Dressing: Minimal assistance Where Assessed-Upper Body Dressing: Edge of bed Lower Body Dressing: Maximal assistance Where Assessed-Lower Body Dressing: Edge of bed Toileting: Not assessed Toilet Transfer:  Moderate assistance Toilet Transfer Method: Stand pivot Science writer: Radiographer, therapeutic: Not assessed Social research officer, government: Not assessed Mobility  Bed Mobility Bed Mobility: Supine to Sit Supine to Sit: Contact Guard/Touching assist Transfers Sit to Stand: Minimal Assistance - Patient > 75%;Moderate Assistance - Patient 50-74% Stand to Sit: Minimal Assistance - Patient > 75%  Session Note: Pt received semi-reclined in bed with wife present, endorses chronic back pain, but no request for intervention at this time, agreeable to OT eval.  Reviewed role of CIR OT, evaluation process, ADL/func mobility retraining, goals for therapy, and safety plan. Evaluation completed as documented above. Came to sitting EOB on his L with CGA and use of bed rail. Reports he has bed rail at home he uses. Close S for static sitting balance EOB. Donned shirt x2 with light min A to pull down over R trunk, noted to be unaware that his R hand was not grasping/pulling down shirt. Total A to don brief/pants, pt able to pull over his knees to assist. Reports he typically uses his manual w/c inside the home, does squat-pivot transfers to and from toilet/bed. He uses his electric w/c outside the home. His PCA helps him with LBD and shower transfers/bathing. Stand-pivot to recliner on his R with heavy min A due to posterior lean. Pt reports feeling dizzy, BP read at 116/95 (103) then 134.85 (99). RN aware. SatO2 at 100%. Endorses  difficulty completing grooming tasks with dominant RUE. Issued soft tan theraputty to target RUE NMR.  Pt left seated in recliner with RN present with safety belt alarm engaged, call bell in reach, and all immediate needs met.  Discharge Criteria: Patient will be discharged from OT if patient refuses treatment 3 consecutive times without medical reason, if treatment goals not met, if there is a change in medical status, if patient makes no progress towards goals or if patient is discharged from hospital.  The above assessment, treatment plan, treatment alternatives and goals were discussed and mutually agreed upon: by patient and by family  Volanda Napoleon MS, OTR/L  06/01/2021, 12:59 PM

## 2021-06-01 NOTE — Plan of Care (Signed)
  Problem: RH Balance Goal: LTG: Patient will maintain dynamic sitting balance (OT) Description: LTG:  Patient will maintain dynamic sitting balance with assistance during activities of daily living (OT) Flowsheets (Taken 06/01/2021 1224) LTG: Pt will maintain dynamic sitting balance during ADLs with: Supervision/Verbal cueing Goal: LTG Patient will maintain dynamic standing with ADLs (OT) Description: LTG:  Patient will maintain dynamic standing balance with assist during activities of daily living (OT)  Flowsheets (Taken 06/01/2021 1224) LTG: Pt will maintain dynamic standing balance during ADLs with: Supervision/Verbal cueing   Problem: Sit to Stand Goal: LTG:  Patient will perform sit to stand in prep for activites of daily living with assistance level (OT) Description: LTG:  Patient will perform sit to stand in prep for activites of daily living with assistance level (OT) Flowsheets (Taken 06/01/2021 1224) LTG: PT will perform sit to stand in prep for activites of daily living with assistance level: Supervision/Verbal cueing   Problem: RH Eating Goal: LTG Patient will perform eating w/assist, cues/equip (OT) Description: LTG: Patient will perform eating with assist, with/without cues using equipment (OT) Flowsheets (Taken 06/01/2021 1224) LTG: Pt will perform eating with assistance level of: Set up assist    Problem: RH Grooming Goal: LTG Patient will perform grooming w/assist,cues/equip (OT) Description: LTG: Patient will perform grooming with assist, with/without cues using equipment (OT) Flowsheets (Taken 06/01/2021 1224) LTG: Pt will perform grooming with assistance level of: Set up assist    Problem: RH Bathing Goal: LTG Patient will bathe all body parts with assist levels (OT) Description: LTG: Patient will bathe all body parts with assist levels (OT) Flowsheets (Taken 06/01/2021 1224) LTG: Pt will perform bathing with assistance level/cueing: Minimal Assistance - Patient > 75%    Problem: RH Dressing Goal: LTG Patient will perform upper body dressing (OT) Description: LTG Patient will perform upper body dressing with assist, with/without cues (OT). Flowsheets (Taken 06/01/2021 1224) LTG: Pt will perform upper body dressing with assistance level of: Supervision/Verbal cueing   Problem: RH Toileting Goal: LTG Patient will perform toileting task (3/3 steps) with assistance level (OT) Description: LTG: Patient will perform toileting task (3/3 steps) with assistance level (OT)  Flowsheets (Taken 06/01/2021 1224) LTG: Pt will perform toileting task (3/3 steps) with assistance level: Supervision/Verbal cueing   Problem: RH Functional Use of Upper Extremity Goal: LTG Patient will use RT/LT upper extremity as a (OT) Description: LTG: Patient will use right/left upper extremity as a stabilizer/gross assist/diminished/nondominant/dominant level with assist, with/without cues during functional activity (OT) Flowsheets (Taken 06/01/2021 1224) LTG: Use of upper extremity in functional activities: RUE as nondominant level LTG: Pt will use upper extremity in functional activity with assistance level of: Supervision/Verbal cueing   Problem: RH Pre-functional/Other (Specify) Goal: RH LTG OT (Specify) 1 Description: RH LTG OT (Specify) 1 Flowsheets (Taken 06/01/2021 1224) LTG: Other OT (Specify) 1: Pt will direct care independently during LB hemi dressing techniques.

## 2021-06-01 NOTE — Progress Notes (Signed)
Patient with bouts of hematuria felt to be exacerbated by his Eliquis as well as history of BPH.  He denies any dysuria or flank pain.  Urology services contacted his primary urologist is Dr. Junious Silk awaiting return call in regards to plan of care

## 2021-06-01 NOTE — Evaluation (Signed)
Physical Therapy Assessment and Plan  Patient Details  Name: [name] [name] MRN: [id number] Date of Birth: Jul 18, 1926  PT Diagnosis: Abnormal posture, Cognitive deficits, Difficulty walking, Hemiparesis dominant, Impaired cognition, Impaired sensation, Low back pain, and Muscle weakness Rehab Potential: Good ELOS: ~2-2.5 weeks   Today's Date: 06/01/2021 PT Individual Time: 9371-6967 PT Individual Time Calculation (min): 53 min    Hospital Problem: Principal Problem:   Left middle cerebral artery stroke [facility])   Past Medical History:  Past Medical History:  Diagnosis Date   AAA (abdominal aortic aneurysm) ([place])    Arthritis    Ascending aorta dilatation ([place]) 03/13/2012   Fusiform dilatation of the ascending thoracic aorta discovered during surgery, max diameter 4.2-4.3 cm   BPH (benign prostatic hypertrophy)    Chronic systolic heart failure ([place]) 03/03/2012   Chronic venous insufficiency    GERD (gastroesophageal reflux disease)    Glaucoma    Gout    History of kidney stones    Hyperlipidemia    Hypertension    Obesity (BMI 30-39.9)    Paroxysmal atrial fibrillation ([place]) 02/29/2012   Recurrent paroxysmal, new-onset    PMR (polymyalgia rheumatica) (HCC)    Polymyalgia rheumatica ([place]) 02/06/2019   Pre-diabetes    S/P CABG x 3 03/06/2012   LIMA to LAD, SVG to OM, SVG to RCA, EVH via right thigh   S/P Maze operation for atrial fibrillation 03/06/2012   Complete bilateral lesions set using bipolar radiofrequency and cryothermy ablation with clipping of LA appendage   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   s/p TAVR with a 26 mm Edwards via the left subclavian by Drs Buena Irish and Bartle   Severe aortic stenosis 09/29/2019   Sleep apnea    USES CPAP NIGHTLY   Past Surgical History:  Past Surgical History:  Procedure Laterality Date   ABDOMINAL AORTOGRAM W/LOWER EXTREMITY N/A 10/26/2020   Procedure: ABDOMINAL AORTOGRAM W/LOWER EXTREMITY;  Surgeon:  Broadus John, MD;  Location: [place] [facility];  Service: Cardiovascular;  Laterality: N/A;   CARDIAC CATHETERIZATION     CATARACT EXTRACTION, BILATERAL     with lens implants   CHOLECYSTECTOMY N/A 03/24/2012   Procedure: LAPAROSCOPIC CHOLECYSTECTOMY WITH INTRAOPERATIVE CHOLANGIOGRAM;  Surgeon: Zenovia Jarred, MD;  Location: [place];  Service: General;  Laterality: N/A;   CORONARY ARTERY BYPASS GRAFT N/A 03/06/2012   Procedure: CORONARY ARTERY BYPASS GRAFTING (CABG);  Surgeon: Rexene Alberts, MD;  Location: [place];  Service: Open Heart Surgery;  Laterality: N/A;   ENDOVEIN HARVEST OF GREATER SAPHENOUS VEIN Right 03/06/2012   Procedure: ENDOVEIN HARVEST OF GREATER SAPHENOUS VEIN;  Surgeon: Rexene Alberts, MD;  Location: [place];  Service: Open Heart Surgery;  Laterality: Right;   INTRAOPERATIVE TRANSESOPHAGEAL ECHOCARDIOGRAM N/A 03/06/2012   Procedure: INTRAOPERATIVE TRANSESOPHAGEAL ECHOCARDIOGRAM;  Surgeon: Rexene Alberts, MD;  Location: [place];  Service: Open Heart Surgery;  Laterality: N/A;   IR KYPHO LUMBAR INC FX REDUCE BONE BX UNI/BIL CANNULATION INC/IMAGING  02/23/2020   LEFT HEART CATH Right 03/02/2012   Procedure: LEFT HEART CATH;  Surgeon: Sherren Mocha, MD;  Location: [facility] CATH LAB;  Service: Cardiovascular;  Laterality: Right;   MAZE N/A 03/06/2012   Procedure: MAZE;  Surgeon: Rexene Alberts, MD;  Location: [place];  Service: Open Heart Surgery;  Laterality: N/A;   PERIPHERAL VASCULAR INTERVENTION Right 10/26/2020   Procedure: PERIPHERAL VASCULAR INTERVENTION;  Surgeon: Broadus John, MD;  Location: [place] [facility];  Service: Cardiovascular;  Laterality: Right;  PROSTATECTOMY     partial   RIGHT/LEFT HEART CATH AND CORONARY/GRAFT ANGIOGRAPHY N/A 09/29/2019   Procedure: RIGHT/LEFT HEART CATH AND CORONARY/GRAFT ANGIOGRAPHY;  Surgeon: Adrian Prows, MD;  Location: [place] [facility];  Service: Cardiovascular;  Laterality: N/A;   TEE WITHOUT CARDIOVERSION N/A 11/10/2019   Procedure:  TRANSESOPHAGEAL ECHOCARDIOGRAM (TEE);  Surgeon: Burnell Blanks, MD;  Location: [place];  Service: Open Heart Surgery;  Laterality: N/A;   TRANSCATHETER AORTIC VALVE REPLACEMENT, TRANSFEMORAL  11/10/2019    Assessment & Plan Clinical Impression: Patient is a 88 y.o. year old right-handed male with history of AAA, CAD with CABG, compression fractures, chronic anemia followed by hematology services, chronic systolic congestive heart failure, A-fib/AF status post MAZE on Xarelto, hypertension, hyperlipidemia, PMR maintained on steroids, prediabetes, early dementia maintained on Namenda, chronic lower extremity wounds with Unna boot OSA with CPAP, PAD with lymphedema status post balloon angioplasty of posterior tibial artery October 2022 recent UTI placed on antibiotic therapy.  Per chart review lives with spouse. She is in the room during history and exam.  He does have a personal care attendant Monday Wednesday Friday for 3 hours.  Modified independent transfers power wheelchair bound x3 to 4 months prior to that ambulating short distances with a rollator.  Present 05/25/2021 with right-sided weakness and aphasia of acute onset.  Cranial CT scan showed small left MCA territory cortical infarct within the mid to posterior left frontal lobe.  CT angiogram head and neck no large vessel occlusion.  Multifocal severe stenosis within the right vertebral artery at the level of the skull base and intracranially.  MRI identified small areas of acute infarct right occipital lobe left frontal lobe over the convexity.  Mild to moderate acute infarct left operculum.  Possible cerebral emboli.  Patient did not receive tPA.  Echocardiogram ejection fraction of 65 to 70% no wall motion abnormalities.  Admission chemistries unremarkable except BUN 26 creatinine 1.71, hemoglobin 9.9, hemoglobin A1c 5.8, troponin 29-32.  Neurology follow-up currently maintained on Eliquis patient had been on Xarelto prior to admission for  atrial fibrillation.  He completed a course of Omnicef for diagnosed UTI prior to hospitalization.  Tolerating regular diet.  Therapy evaluations completed due to patient's right-sided weakness and aphasia was admitted for a comprehensive rehab program. He reports chronic pain pain due to compression fracture, followed by orthopaedics. Patient transferred to CIR on 05/31/2021 .   Patient currently requires mod assist with mobility secondary to muscle weakness, decreased cardiorespiratoy endurance, unbalanced muscle activation, decreased attention to right, decreased initiation, decreased memory, and delayed processing, and decreased sitting balance, decreased standing balance, decreased postural control, hemiplegia, and decreased balance strategies.  Prior to hospitalization, patient was modified independent  with mobility and lived with Spouse in a House home.  Home access is  Elevator.  Patient will benefit from skilled PT intervention to maximize safe functional mobility, minimize fall risk, and decrease caregiver burden for planned discharge home with 24 hour supervision.  Anticipate patient will benefit from follow up [place] at discharge.  PT - End of Session Activity Tolerance: Tolerates 30+ min activity with multiple rests Endurance Deficit: Yes Endurance Deficit Description: requries frequent rest breaks and only completes limited amount of mobility during eval PT Assessment Rehab Potential (ACUTE/IP ONLY): Good PT Barriers to Discharge: Decreased caregiver support;Incontinence PT Patient demonstrates impairments in the following area(s): Balance;Nutrition;Skin Integrity;Behavior;Pain;Edema;Perception;Endurance;Safety;Motor;Sensory PT Transfers Functional Problem(s): Bed Mobility;Bed to Chair;Car;Furniture PT Locomotion Functional Problem(s): Ambulation;Wheelchair Mobility;Stairs PT Plan PT Intensity: Minimum of 1-2 x/day ,  45 to 90 minutes PT Frequency: 5 out of 7 days PT Duration Estimated  Length of Stay: ~2-2.5 weeks PT Treatment/Interventions: Ambulation/gait training;Cognitive remediation/compensation;Discharge planning;DME/adaptive equipment instruction;Functional mobility training;Pain management;Psychosocial support;Splinting/orthotics;Therapeutic Activities;UE/LE Strength taining/ROM;Visual/perceptual remediation/compensation;Balance/vestibular training;Community reintegration;Disease management/prevention;Functional electrical stimulation;Neuromuscular re-education;Patient/family education;Skin care/wound management;Stair training;Therapeutic Exercise;UE/LE Coordination activities;Wheelchair propulsion/positioning PT Transfers Anticipated Outcome(s): supervision using LRAD PT Locomotion Anticipated Outcome(s): supervision short household distances using LRAD PT Recommendation Follow Up Recommendations: Home health PT;24 hour supervision/assistance Patient destination: Home Equipment Recommended: To be determined   PT Evaluation Precautions/Restrictions Precautions Precautions: Fall;Other (comment) Precaution Comments: R hemiparesis, chronic LBP Restrictions Weight Bearing Restrictions: No Pain Pain Assessment Pain Scale: 0-10 Pain Score: 5  Pain Type: Chronic pain Pain Location: Back Pain Orientation: Lower Pain Descriptors / Indicators: Discomfort Pain Onset: On-going Pain Intervention(s): Repositioned;Medication (See eMAR);Emotional support;Distraction Multiple Pain Sites: Yes 2nd Pain Site Pain Score: 4 Pain Type: Acute pain Pain Location: Abdomen Pain Descriptors / Indicators: Other (Comment) (stating he feels the pain is associated with the feeling to void his bladder but being unable to - pt states he feels "bound up" in the recliner and cannot relax to void) Pain Intervention(s): Relaxation;Repositioned Pain Interference Pain Interference Pain Effect on Sleep: 1. Rarely or not at all (pt states pain "dissipates" when lying horizontal) Pain  Interference with Therapy Activities: 1. Rarely or not at all Pain Interference with Day-to-Day Activities: 1. Rarely or not at all Home Living/Prior [place] Available Help at Discharge: Family;Personal care attendant;Available PRN/intermittently (PCA Mon, Wed, Fri for 3 hrs each day at 8AM to help with bathing, dressing and morning meal) Type of Home: House Home Access: Elevator Home Layout: Able to live on main level with bedroom/bathroom Bathroom Shower/Tub: Multimedia programmer: Handicapped height Bathroom Accessibility: Yes  Lives With: Spouse (wife, [name]) Prior Function Level of Independence: Needs assistance with ADLs;Needs assistance with tranfers;Needs assistance with homemaking (was using rollator but in past month transitioned to using manual w/c inside & power wheelchair for community (pt self purchased his power wheelchair) - performed partial stand pivot transfers bed<>w/c using secured bedrailing mod-I & supine<>sit mod-I)  Able to Take Stairs?: No Driving: No Vocation: Retired Vision/Perception   Vision - History Ability to See in Adequate Light: 0 Adequate Perception Perception: Impaired Inattention/Neglect: Does not attend to right side of body Praxis Praxis: Intact  Cognition  Overall Cognitive Status: Within Functional Limits for tasks assessed Arousal/Alertness: Awake/alert Orientation Level: Oriented X4 Year: 2023 Month: June Day of Week: Correct Attention: Focused;Sustained;Selective Focused Attention: Appears intact Sustained Attention: Appears intact Selective Attention: Appears intact Memory: Impaired Memory Impairment: Decreased recall of new information Awareness: Appears intact Problem Solving: Appears intact Safety/Judgment: Appears intact Sensation Sensation Light Touch: Impaired Detail Peripheral sensation comments: hx of peripheral neuropathy with decreased sensation in distal B LEs (limited access to L lower leg  due to [employer]) Light Touch Impaired Details: Impaired LLE;Impaired RLE Hot/Cold: Not tested Proprioception: Impaired by gross assessment Stereognosis: Not tested Coordination Gross Motor Movements are Fluid and Coordinated: No Coordination and Movement Description: mild R hemiparesis with generalized deconditioning Motor  Motor Motor: Hemiplegia;Abnormal postural alignment and control Motor - Skilled Clinical Observations: mild R hemiparesis with generalized deconditioning and increased low back pain   Trunk/Postural Assessment  Cervical Assessment Cervical Assessment: Exceptions to [facility] (forward head) Thoracic Assessment Thoracic Assessment: Exceptions to [facility] (thoracic kyphosis with rounded shoulders) Lumbar Assessment Lumbar Assessment: Exceptions to [facility] (posterior pelvic tilt in sitting) Postural Control Postural Control: Deficits on evaluation Righting Reactions: delayed  and inadequate Protective Responses: delayed and inadequate Postural Limitations: impaired requiring B UE support and having posterior lean bias  Balance Balance Balance Assessed: Yes Static Sitting Balance Static Sitting - Balance Support: Feet supported Static Sitting - Level of Assistance: 5: Stand by assistance Dynamic Sitting Balance Dynamic Sitting - Balance Support: During functional activity Dynamic Sitting - Level of Assistance: Other (comment) (CGA) Static Standing Balance Static Standing - Balance Support: Bilateral upper extremity supported;During functional activity Static Standing - Level of Assistance: 4: Min assist Dynamic Standing Balance Dynamic Standing - Balance Support: During functional activity;Bilateral upper extremity supported Dynamic Standing - Level of Assistance: 3: Mod assist;2: Max assist Extremity Assessment      RLE Assessment RLE Assessment: Exceptions to [facility] Passive Range of Motion (PROM) Comments: [facility] General Strength Comments: assessed in supine RLE  Strength Right Hip Flexion: 3+/5 Right Knee Flexion: 4-/5 Right Knee Extension: 4/5 Right Ankle Dorsiflexion: 0/5 (possible trace activation but difficult to determine due to [employer] - pt reports this foot drop is chronic) Right Ankle Plantar Flexion: 3+/5 LLE Assessment LLE Assessment: Exceptions to [facility] General Strength Comments: assessed in supine LLE Strength Left Hip Flexion: 4-/5 Left Knee Flexion: 4/5 Left Knee Extension: 4/5 Left Ankle Dorsiflexion: 2+/5 Left Ankle Plantar Flexion: 3+/5  Care Tool Care Tool Bed Mobility Roll left and right activity   Roll left and right assist level: Minimal Assistance - Patient > 75%    Sit to lying activity   Sit to lying assist level: Moderate Assistance - Patient 50 - 74%    Lying to sitting on side of bed activity   Lying to sitting on side of bed assist level: the ability to move from lying on the back to sitting on the side of the bed with no back support.: Minimal Assistance - Patient > 75%     Care Tool Transfers Sit to stand transfer   Sit to stand assist level: Moderate Assistance - Patient 50 - 74% Sit to stand assistive device: Walker  Chair/bed transfer   Chair/bed transfer assist level: Minimal Assistance - Patient > 75% (squat pivot)     Psychologist, counselling transfer activity did not occur: Safety/medical concerns        Care Tool Locomotion Ambulation Ambulation activity did not occur: Safety/medical concerns        Walk 10 feet activity Walk 10 feet activity did not occur: Safety/medical concerns       Walk 50 feet with 2 turns activity Walk 50 feet with 2 turns activity did not occur: Safety/medical concerns      Walk 150 feet activity Walk 150 feet activity did not occur: Safety/medical concerns      Walk 10 feet on uneven surfaces activity Walk 10 feet on uneven surfaces activity did not occur: Safety/medical concerns      Stairs Stair activity did not occur: Safety/medical  concerns        Walk up/down 1 step activity Walk up/down 1 step or curb (drop down) activity did not occur: Safety/medical concerns      Walk up/down 4 steps activity Walk up/down 4 steps activity did not occur: Safety/medical concerns      Walk up/down 12 steps activity Walk up/down 12 steps activity did not occur: Safety/medical concerns      Pick up small objects from floor   Pick up small object from the floor assist level: Total Assistance - Patient < 25% Pick  up small object from the floor assistive device: using RW  Wheelchair Is the patient using a wheelchair?: Yes Type of Wheelchair: Manual   Wheelchair assist level: Supervision/Verbal cueing;Set up assist Max wheelchair distance: 112f  Wheel 50 feet with 2 turns activity   Assist Level: Supervision/Verbal cueing  Wheel 150 feet activity   Assist Level: Minimal Assistance - Patient > 75%    Refer to Care Plan for Long Term Goals  SHORT TERM GOAL WEEK 1 PT Short Term Goal 1 (Week 1): Pt will consistently perform supine<>sit with min assist PT Short Term Goal 2 (Week 1): Pt will perform sit<>stands using LRAD with CGA consistently PT Short Term Goal 3 (Week 1): Pt will perform bed<>chair transfers using LRAD with CGA consistently PT Short Term Goal 4 (Week 1): Pt will ambulate at least 146fusing LRAD with min assist of 1 and +2 w/c follow if needed  Recommendations for other services: None   Skilled Therapeutic Intervention Pt received sitting in recliner and agreeable to therapy session despite reporting fatigue. Evaluation completed (see details above) with patient education regarding purpose of PT evaluation, PT POC and goals, therapy schedule, weekly team meetings, and other CIR information including safety plan and fall risk safety. Pt completed the below functional mobility tasks with the specified levels of skilled cuing and assistance. Performed squat pivot transfers using UE support on armrests with min assist  during session - adequate hip clearance with slow, steady movement. Sit<>stand elevated EOB<>RW with mod assist and pt reporting significant increase in LBP once in standing therefore returned to sitting - pt reports progressively worsening low back pain in last ~3-4weeks following a fall. Pt with fatigue at end of session requesting to use bed pan rather than BSC as recommended by therapist - pt noted to strain in order to void bladder - pt states he didn't feel like he could void in the recliner position. At end of session, pt left supine in bed with needs in reach, lines intact, and bed alarm on.    Mobility Bed Mobility Bed Mobility: Supine to Sit;Sit to Supine Supine to Sit: Moderate Assistance - Patient 50-74% Sit to Supine: Moderate Assistance - Patient 50-74% Transfers Transfers: Sit to Stand;Stand to Sit;Squat Pivot Transfers Sit to Stand: Minimal Assistance - Patient > 75%;Moderate Assistance - Patient 50-74% Stand to Sit: Minimal Assistance - Patient > 75% Squat Pivot Transfers: Moderate Assistance - Patient 50-74% Transfer (Assistive device): Rolling walker (for sit<>stand) Locomotion  Gait Ambulation: No Gait Gait: No Stairs / Additional Locomotion Stairs: No Wheelchair Mobility Wheelchair Mobility: Yes Wheelchair Assistance: SuChartered loss adjusterBoth lower extermities Wheelchair Parts Management: Needs assistance Distance: 1254f Discharge Criteria: Patient will be discharged from PT if patient refuses treatment 3 consecutive times without medical reason, if treatment goals not met, if there is a change in medical status, if patient makes no progress towards goals or if patient is discharged from hospital.  The above assessment, treatment plan, treatment alternatives and goals were discussed and mutually agreed upon: by patient  CarTawana ScalePT, DPT, NCS, CSRS 06/01/2021, 12:23 PM

## 2021-06-01 NOTE — Plan of Care (Signed)
  Problem: RH Expression Communication Goal: LTG Patient will verbally express basic/complex needs(SLP) Description: LTG:  Patient will verbally express basic/complex needs, wants or ideas with cues  (SLP) Flowsheets (Taken 06/01/2021 1912) LTG: Patient will verbally express basic/complex needs, wants or ideas (SLP): Modified Independent Goal: LTG Patient will increase speech intelligibility (SLP) Description: LTG: Patient will increase speech intelligibility at word/phrase/conversation level with cues, % of the time (SLP) Flowsheets (Taken 06/01/2021 1912) LTG: Patient will increase speech intelligibility (SLP): Supervision

## 2021-06-02 DIAGNOSIS — R310 Gross hematuria: Secondary | ICD-10-CM

## 2021-06-02 LAB — BASIC METABOLIC PANEL
Anion gap: 7 (ref 5–15)
BUN: 36 mg/dL — ABNORMAL HIGH (ref 8–23)
CO2: 26 mmol/L (ref 22–32)
Calcium: 9.6 mg/dL (ref 8.9–10.3)
Chloride: 106 mmol/L (ref 98–111)
Creatinine, Ser: 1.74 mg/dL — ABNORMAL HIGH (ref 0.61–1.24)
GFR, Estimated: 36 mL/min — ABNORMAL LOW (ref >60)
Glucose, Bld: 112 mg/dL — ABNORMAL HIGH (ref 70–99)
Potassium: 3.5 mmol/L (ref 3.5–5.1)
Sodium: 139 mmol/L (ref 135–145)

## 2021-06-02 LAB — CBC
HCT: 31.9 % — ABNORMAL LOW (ref 39.0–52.0)
Hemoglobin: 10 g/dL — ABNORMAL LOW (ref 13.0–17.0)
MCH: 28.7 pg (ref 26.0–34.0)
MCHC: 31.3 g/dL (ref 30.0–36.0)
MCV: 91.4 fL (ref 80.0–100.0)
Platelets: 165 10*3/uL (ref 150–400)
RBC: 3.49 MIL/uL — ABNORMAL LOW (ref 4.22–5.81)
RDW: 17 % — ABNORMAL HIGH (ref 11.5–15.5)
WBC: 5.5 10*3/uL (ref 4.0–10.5)
nRBC: 0 % (ref 0.0–0.2)

## 2021-06-02 MED ORDER — CHLORHEXIDINE GLUCONATE CLOTH 2 % EX PADS
6.0000 | MEDICATED_PAD | Freq: Every day | CUTANEOUS | Status: DC
  Administered 2021-06-02 – 2021-06-06 (×6): 6 via TOPICAL

## 2021-06-02 NOTE — Progress Notes (Signed)
Speech Language Pathology Daily Session Note  Patient Details  Name: [name] [name] MRN: [id number] Date of Birth: 07-14-1926  Today's Date: 06/02/2021 SLP Individual Time: 0240-9735 SLP Individual Time Calculation (min): 60 min  Short Term Goals: Week 1: SLP Short Term Goal 1 (Week 1): Patient will participate in further cognitive-linguistic evaluation SLP Short Term Goal 1 - Progress (Week 1): Met SLP Short Term Goal 2 (Week 1): Patient will produce functional multisyllabic words with implementation of speech strategies (slow rate, seperate syllables) during 75% of occasions with min-to-mod A clinicial cueing/modeling SLP Short Term Goal 3 (Week 1): Patient will use appropriate articulatory accuracy and speech rate when verbally reading/speaking with 50% accuracy given mod verbal cues/modeling. SLP Short Term Goal 4 (Week 1): Pt will complete functional complex problem solving tasks in the areas of medication, money and time management mod I. SLP Short Term Goal 5 (Week 1): Pt will demonstrate selective attention in a mildly-distracting enviornment for 30 minutes mod I. SLP Short Term Goal 6 (Week 1): Pt will participate in higher level word finding tasks and be able to engage in abstract conversation with extra time and Supervision A semantic cues.  Skilled Therapeutic Interventions:Skilled ST services focused on cognitive skills. SLP facilitated continued assessment on cognitive skills administering CLQT. Pt scored WFL on all subsections, however at the lower end of attention, executive function and language. Pt expressed acute changes in higher level word finding/spelling and attention during complex tasks. Pt was completing medication/money/time management mod I prior to admission, therefore could benefit targeting acute weak cognitive skills in order to maintain independence. SLP and pt discussed goals. SLP added goals accordingly. Pt was left in room with call bell within reach and chair alarm  set. SLP recommends to continue skilled services.     Pain Pain Assessment Pain Score: 0-No pain  Therapy/Group: Individual Therapy  Alisia Vanengen  [facility] 06/02/2021, 1:19 PM

## 2021-06-02 NOTE — Progress Notes (Addendum)
Occupational Therapy Session Note  Patient Details  Name: [name] [name] MRN: [id number] Date of Birth: 01-11-1926  Today's Date: 06/02/2021 OT Individual Time: 0017-4944 session 1 OT Individual Time Calculation (min): 51 min  Session 2: 9675-9163    Short Term Goals: Week 1:  OT Short Term Goal 1 (Week 1): Pt will don shirt with S. OT Short Term Goal 2 (Week 1): Pt will complete toilet transfer with min A. OT Short Term Goal 3 (Week 1): Pt will complete bathing with mod A at sink. OT Short Term Goal 4 (Week 1): Pt will position his RUE when in chair/bed with no more than mod VCs.  Skilled Therapeutic Interventions/Progress Updates:  Session 1: Pt greeted supine in bed    agreeable to OT intervention. Session focus on BADL reeducation, functional mobility, dynamic standing balance and decreasing overall caregiver burden.                                   Pt completed supine>sit with CGA, MOD A to rise into standing from EOB d/t posterior lean, pt completed stand pivot to w/c with RW and MODA. Pt reports need to void bowels, total A transport into bathroom in w/c with pt completing squat pivot to toilet with BSC over toilet with MOD A, MIN verbal/tactile cues for hand placement. +BM and urine void, with urine presenting as dark red color and stool with red streak in it; RN observed BM/urine void with void charted in flow sheets. MAX A for 3/3 toileting tasks for cleanliness as urine spilled on the floor, also heel cover got soiled; alerted RN who suggested donning bandage while RN order new heel cover. Pt completed squat pivot back to w/c with MODA. Pt completed bathing at sink with overall MIN A needed for LB bathing.  Pt donned shirt with MIN A as pt orginally donned backwards but was aware, MOD A to don pants as pt needs assist threading and pulling to waist line. Supervision for seated oral care with RUE [place] noted as pt dropping items with little awareness. Pt left seated seated in w/c  with alarm belt activated and all needs within reach.   Session 2:  Pt received asleep in supine but easily able to arouse and  agreeable to OT intervention. Session focus on RUE [facility] and education related to compensatory methods for loss of sensation in RUE. Pt completed peg board task from semi reclined in bed ( d/t increased back pain) with pt instucted to use RUE only to place pegs in pattern with an emphasis on RUE pincer grasp and various in hand manipulation skills such as rotation and translation. Pt completed task with supervision and MIN verbal cues for technique.  Next, pt completed thearaputty therapeutic activity where pt instructed to remove small pegs from putty with RUE with a focus on small pincer grasp and implementing visual compensatory strategy d/t impaired sensation in RUE.  pt left supine in bed with bed alarm activated and all needs within reach.                       Therapy Documentation Precautions:  Precautions Precautions: Fall, Other (comment) Precaution Comments: R hemiparesis, chronic LBP Restrictions Weight Bearing Restrictions: No    Pain:session 1 unrated back pain, rest breaks and repositioning provided as needed.  Session 2: unrated back pain reported, repositioned provided from bed level  Therapy/Group: Individual Therapy  Corinne Ports [facility] 06/02/2021, 12:03 PM

## 2021-06-02 NOTE — Progress Notes (Signed)
PROGRESS NOTE   Subjective/Complaints: Pt with hematuria that started yesterday.  He has history of BPH and is followed by urology Reports he has had this in the past but it is worse now than typical.   Review of Systems  Constitutional:  Negative for fever.  Respiratory:  Negative for shortness of breath.   Cardiovascular:  Negative for chest pain.  Gastrointestinal:  Negative for abdominal pain, constipation, nausea and vomiting.  Genitourinary:  Positive for hematuria.     Objective:   No results found. Recent Labs    06/01/21 0529 06/02/21 0551  WBC 6.5 5.5  HGB 9.1* 10.0*  HCT 28.9* 31.9*  PLT 160 165    Recent Labs    06/01/21 0529 06/02/21 0551  NA 140 139  K 3.6 3.5  CL 105 106  CO2 27 26  GLUCOSE 108* 112*  BUN 36* 36*  CREATININE 1.89* 1.74*  CALCIUM 9.5 9.6     Intake/Output Summary (Last 24 hours) at 06/02/2021 1153 Last data filed at 06/01/2021 2000 Gross per 24 hour  Intake 120 ml  Output 400 ml  Net -280 ml      Pressure Injury 05/26/21 Heel Right Stage 3 -  Full thickness tissue loss. Subcutaneous fat may be visible but bone, tendon or muscle are NOT exposed. (Active)  05/26/21 (Initially identified 10/22/20; follow at wound clinic; wife doing dressings)   Location: Heel  Location Orientation: Right  Staging: Stage 3 -  Full thickness tissue loss. Subcutaneous fat may be visible but bone, tendon or muscle are NOT exposed.  Wound Description (Comments):   Present on Admission: Yes    Physical Exam: Vital Signs Blood pressure 114/70, pulse 62, temperature 97.8 F (36.6 C), temperature source Oral, resp. rate 18, height 6' (1.829 m), weight 98.2 kg, SpO2 98 %.    Assessment/Plan: 1. Functional deficits which require 3+ hours per day of interdisciplinary therapy in a comprehensive inpatient rehab setting. Physiatrist is providing close team supervision and 24 hour management of active  medical problems listed below. Physiatrist and rehab team continue to assess barriers to discharge/monitor patient progress toward functional and medical goals  Care Tool:  Bathing    Body parts bathed by patient: Face, Right arm, Left arm, Chest, Abdomen         Bathing assist Assist Level: Minimal Assistance - Patient > 75%     Upper Body Dressing/Undressing Upper body dressing   What is the patient wearing?: Pull over shirt, Hospital gown only    Upper body assist Assist Level: Minimal Assistance - Patient > 75%    Lower Body Dressing/Undressing Lower body dressing      What is the patient wearing?: Underwear/pull up, Pants     Lower body assist Assist for lower body dressing: Total Assistance - Patient < 25%     Toileting Toileting Toileting Activity did not occur Landscape architect and hygiene only): N/A (no void or bm)  Toileting assist Assist for toileting: Maximal Assistance - Patient 25 - 49%     Transfers Chair/bed transfer  Transfers assist     Chair/bed transfer assist level: Minimal Assistance - Patient > 75% (squat pivot)  Locomotion Ambulation   Ambulation assist   Ambulation activity did not occur: Safety/medical concerns          Walk 10 feet activity   Assist  Walk 10 feet activity did not occur: Safety/medical concerns        Walk 50 feet activity   Assist Walk 50 feet with 2 turns activity did not occur: Safety/medical concerns         Walk 150 feet activity   Assist Walk 150 feet activity did not occur: Safety/medical concerns         Walk 10 feet on uneven surface  activity   Assist Walk 10 feet on uneven surfaces activity did not occur: Safety/medical concerns         Wheelchair     Assist Is the patient using a wheelchair?: Yes Type of Wheelchair: Manual    Wheelchair assist level: Supervision/Verbal cueing, Set up assist Max wheelchair distance: 1110f    Wheelchair 50 feet with 2  turns activity    Assist        Assist Level: Supervision/Verbal cueing   Wheelchair 150 feet activity     Assist      Assist Level: Minimal Assistance - Patient > 75%   Blood pressure 114/70, pulse 62, temperature 97.8 F (36.6 C), temperature source Oral, resp. rate 18, height 6' (1.829 m), weight 98.2 kg, SpO2 98 %.  General: Alert and oriented x 3, No apparent distress, sitting in chair HEENT:, oral mucosa pink and moist Neck: Supple  Heart: Reg rate and rhythm. No murmurs rubs or gallops Chest: CTA bilaterally without wheezes, rales, or rhonchi; no distress Abdomen: Soft, mildly tender, non-distended, bowel sounds positive. Extremities: No clubbing, cyanosis, or edema.  He has small wounds on his right thigh, dorsal foot covered in dressing, R heel wound,  he has a wrapped clean wound on his left lower extremity from a Mohs surgery, bruising on dorsal bilateral upper extremities Psych: Pt's affect is appropriate. Pt is cooperative Skin: warm and dry, as above Neuro: Alert and oriented x4, cranial nerves II through XII intact ,follows commands, able to name 2 out of 2 objects, able to repeat 3 out of 3 words, he does have expressive aphasia and dysarthria, delayed processing, comprehension appears to be normal ,sensation altered in his right upper extremity to light touch, finger-nose normal bilaterally Musculoskeletal: No joint redness or swelling noted Strength 5 out of 5 in bilateral upper extremities 4 out of 5 bilateral hip flexion 5 out of 5 bilateral knee extension Right ankle PF and DF 2 out of 5, left ankle PF 2 out of 5, DF 1 out of 5.  Patient reports this is chronic possibly due to vascular disease Tone is normal No joint swelling noted  GU bloody urine noted   Medical Problem List and Plan: 1. Functional deficits secondary to left MCA embolic CVA             -patient may shower             -ELOS/Goals: 16-18 days PT/OT Min A wheelchair level; SLP Mod  I  -Continue PT, OT and SLP 2.  Antithrombotics: -DVT/anticoagulation:  Pharmaceutical: Other (comment) Eliquis now, previously on xarelto             -antiplatelet therapy: N/A 3. Pain Management: Lidoderm patch, Neurontin 200 mg twice daily and 100 mg nightly, tramadol as needed 4. Mood: Namenda 10 mg twice daily, Razadyne 8 mg daily             -  antipsychotic agents: N/A 5. Neuropsych: This patient is capable of making decisions on his own behalf. 6. Skin/Wound Care: Routine skin checks.  Chronic lower extremity wounds. LLE wound from Mohs procedure. Follow-up wound care nurse -Consult for unna boot replacement 7. Fluids/Electrolytes/Nutrition: Routine in and outs with follow-up chemistries 8.  Atrial fibrillation.  Amiodarone 200 mg daily.  Continue Eliquis.  Cardiac rate controlled 9.  CAD/CABG 2014.  No chest pain or shortness of breath.Continue Imdur 60 mg daily -Denies chest pain 10.  History of severe aortic stenosis status post TAVR procedure 2021.  Follow-up cardiology services 11.  History of PMR.  Currently maintained on ARAVA 10 mg daily as well as chronic prednisone 3 mg daily 12.  BPH.  Proscar 5 mg daily.  Check PVR, Has been using external catheter 13.  Hyperlipidemia.  Lipitor/Zetia 14.  Chronic systolic congestive heart failure.  Demadex 40 mg daily. Imdur '60mg'$  daily. Monitor for signs of fluid overload 15.  OSA.  CPAP 16.  GERD.  Protonix 17.  PAD with lymphedema status post balloon angioplasty of posterior tibial artery October 2022.  Follow-up outpatient 18.  Chronic anemia.  Followed by hematology services.  He had been receiving Epogen injections as an outpatient.             -6/1 Hgb 9.1  -6/2 Hgb stable  at 10    Latest Ref Rng & Units 06/02/2021    5:51 AM 06/01/2021    5:29 AM 05/28/2021   12:34 AM  CBC  WBC 4.0 - 10.5 K/uL 5.5   6.5   4.6    Hemoglobin 13.0 - 17.0 g/dL 10.0   9.1   8.6    Hematocrit 39.0 - 52.0 % 31.9   28.9   28.2    Platelets 150 - 400  K/uL 165   160   126      19. Dementia, continue galantamine and memantine 20. CKD IIIB, avoid nephrotoxic medications Lab Results  Component Value Date   CREATININE 1.74 (H) 06/02/2021   CREATININE 1.89 (H) 06/01/2021   CREATININE 1.70 (H) 05/30/2021  6/1 Cr increased, repeat tomorrow, hold torsemide 6/2 Cr a little improved to 1.74, follow 21. HTN continue imdur -BP overall well controlled 22. Hematuria, likely related to anticoagulation  -Called Urology, will start Foley with irrigations  -Repeat H/H tomorrow  -Will call urology back if this persists    LOS: 2 days A FACE TO [place] 06/02/2021, 11:53 AM

## 2021-06-02 NOTE — Progress Notes (Signed)
Physical Therapy Session Note  Patient Details  Name: [name] [name] MRN: [id number] Date of Birth: 03-31-1926  Today's Date: 06/02/2021 PT Individual Time: 1102-1200 PT Individual Time Calculation (min): 58 min   Short Term Goals: Week 1:  PT Short Term Goal 1 (Week 1): Pt will consistently perform supine<>sit with min assist PT Short Term Goal 2 (Week 1): Pt will perform sit<>stands using LRAD with CGA consistently PT Short Term Goal 3 (Week 1): Pt will perform bed<>chair transfers using LRAD with CGA consistently PT Short Term Goal 4 (Week 1): Pt will ambulate at least 11f using LRAD with min assist of 1 and +2 w/c follow if needed  Skilled Therapeutic Interventions/Progress Updates:  Patient seated upright in w/c on entrance to room. Patient alert and agreeable to PT session.   Patient with intermittent pain complaint throughout session. Unrated Bil arm weakness/ pain and low back pain with time spent in standing. Addressed with repositioning.    Wheelchair Mobility:  Patient propelled wheelchair 135 feet with supervision. Slow but steady propel. Provided vc for minor adjustments to technique in maneuvering and propel.  Therapeutic Activity: Transfers: Patient performed sit<>stand and stand pivot transfers throughout session with Min/ [place]A to [place] Sit[place]stand in // bars with difficulty finding good hand position. Provided verbal cues for forward lean, foot positioning and effort in power up.   Neuromuscular Re-ed: NMR facilitated during session with focus on standing balance, muscle activation and coordination. Pt guided in toe taps to 2" step. Is able to perform with guard to stance knee. Move to mat table and 4" step using RW. Pt requires more assist with rise to stand and is able to touch increased height of step one time with each LE prior to need to sit. Increased arm soreness noted with time in stance. Guided in minisquats at both // bars and RW 2x5 reps each. W/c pushups performed  x10 for improved power up during transfers. No physical assist provided. NMR performed for improvements in motor control and coordination, balance, sequencing, judgement, and self confidence/ efficacy in performing all aspects of mobility at highest level of independence.   Skin tear noted to pt's R elbow. Cleaned prior to RN arrival to apply bandage. RN also requests pt to return to bed in supine in order to place foley catheter. Pt able to use therapist and bedrail for rise to stand, pivot stepping, and descent to sit on EOB. Return to supine with MinA for BLE and roll to back. Pt explains that he is more comfortable with bed flat for mgmt of back pain.   Patient supine  in bed at end of session with brakes locked, bed alarm set, and all needs within reach.   Therapy Documentation Precautions:  Precautions Precautions: Fall, Other (comment) Precaution Comments: R hemiparesis, chronic LBP Restrictions Weight Bearing Restrictions: No General:   Vital Signs:   Pain:    Therapy/Group: Individual Therapy  JAlger SimonsPT, DPT 06/02/2021, 5:46 PM

## 2021-06-02 NOTE — Progress Notes (Signed)
Pt has home CPAP at bedside. Water chamber refilled, and machine placed within pt's reach. Advised pt to notify for RT if any further assistance is needed.

## 2021-06-03 DIAGNOSIS — R319 Hematuria, unspecified: Secondary | ICD-10-CM

## 2021-06-03 DIAGNOSIS — E876 Hypokalemia: Secondary | ICD-10-CM

## 2021-06-03 DIAGNOSIS — S91302A Unspecified open wound, left foot, initial encounter: Secondary | ICD-10-CM

## 2021-06-03 LAB — BASIC METABOLIC PANEL
Anion gap: 9 (ref 5–15)
BUN: 33 mg/dL — ABNORMAL HIGH (ref 8–23)
CO2: 25 mmol/L (ref 22–32)
Calcium: 9.2 mg/dL (ref 8.9–10.3)
Chloride: 106 mmol/L (ref 98–111)
Creatinine, Ser: 1.55 mg/dL — ABNORMAL HIGH (ref 0.61–1.24)
GFR, Estimated: 41 mL/min — ABNORMAL LOW (ref >60)
Glucose, Bld: 115 mg/dL — ABNORMAL HIGH (ref 70–99)
Potassium: 3.3 mmol/L — ABNORMAL LOW (ref 3.5–5.1)
Sodium: 140 mmol/L (ref 135–145)

## 2021-06-03 LAB — HEMOGLOBIN AND HEMATOCRIT, BLOOD
HCT: 29 % — ABNORMAL LOW (ref 39.0–52.0)
Hemoglobin: 9.1 g/dL — ABNORMAL LOW (ref 13.0–17.0)

## 2021-06-03 NOTE — Progress Notes (Signed)
Physical Therapy Session Note  Patient Details  Name: [name] [name] MRN: [id number] Date of Birth: 04-16-1926  Today's Date: 06/03/2021 PT Individual Time: 0921-1007 and 7829-5621 PT Individual Time Calculation (min): 46 min and 44 min  Short Term Goals: Week 1:  PT Short Term Goal 1 (Week 1): Pt will consistently perform supine<>sit with min assist PT Short Term Goal 2 (Week 1): Pt will perform sit<>stands using LRAD with CGA consistently PT Short Term Goal 3 (Week 1): Pt will perform bed<>chair transfers using LRAD with CGA consistently PT Short Term Goal 4 (Week 1): Pt will ambulate at least 93f using LRAD with min assist of 1 and +2 w/c follow if needed  Skilled Therapeutic Interventions/Progress Updates:    Session 1: Pt received asleep, supine in bed but easily awakens and agreeable to therapy session. Supine>sitting L EOB, HOB slightly elevated and relying heavily on bedrails, via logroll technique with supervision. Attempted partial squat pivot transfer EOB>w/c; however, pt coming into more of a full stand using w/c armrests and bedrail for UE support therefore provided pt with RW and he completed R stand pivot to w/c with heavy min assist for lifting to stand and balance while turning - stays in very flexed posture with very small step lengths on R.   Seated in w/c at sink, washed face and performed oral care with set-up assist - noticed to have to hold toothbrush with both hands due to R UE paresis and appearing impaired sensation/proprioception with limited awareness of when grasping onto the brush.    B UE w/c propulsion ~1411fto dayroom with supervision for safety - slow propulsion technique with decreased efficiency.  Sit<>stands to/from RW with min assist for lifting to stand and balance due to posterior lean - remains in excessively forward flexed trunk posturing.  Gait training ~[id number] using RW with heavy min assist for balance and +2 close w/c follow for safety - demos lack  of B LE ankle DF during swing with foot drop on initial contact vs lack of adequate foot clearance during swing advancement - short B LE step lengths - excessive forward trunk flexion with increased WBing down through AD - has increased low back pain with standing limiting gait distance.  B LE strengthening using Kinetron from w/c for 5 minutes against 78cm/sec resistance. Transported back to room and left seated in w/c with needs in reach, lines intact, and seat belt alarm on.  Session 2: Pt received supine in bed, resting and agreeable to therapy session. Supine>sitting L EOB with HOB slightly elevated and using bedrails with supervision and increased time. Sit>stand EOB>RW with heavy min assist for rising to stand and balance due to posterior lean - pt continues to grimace when rising upright due to low back pain - maintains forward flexed trunk posturing. R stand pivot to pt's personal power w/c with heavy min assist for balance and pt having very short, shuffled steps with excessive anterior trunk lean relying heavily on RW support. Power w/c mobility ~140f38f day room with close supervision and verbal cuing for safety. L partial stand/squat pivot transfer w/c>Nustep seat using armrest support on both seats to maintain balance while turning with min assist for balance as well. Performed B LE reciprocal movement pattern strengthening on Nustep against level 3 resistance for 7 minutes total - pt requires use of theraband straps on the back of his heels to prevent his feet from falling off the pedals. Pt's wife arrives during session and states that  her husband needs to reach at minimum a supervision level for transfers bed<>w/c<>toilet and perform wheelchair mobility mod-I - she states she can provide close to 24hr support but needs to be able to leave to go run errands. R stand pivot transfer Nustep>power w/c using RW with heavy min assist for rising to stand and balance while turning as described above.  Power w/c mobility back to his room with close supervision and 3x therapist assisting pt in avoiding hitting the wall on his L. L partial stand/squat pivot w/c>EOB using armrests of w/c and bedrail for support with light min assist for balance while turning. Sit>supine with mod assist for B LE management onto bed. Pt left supported upright in bed with needs in reach, lines intact, bed alarm on, pt's wife and the nurse present.  Therapy Documentation Precautions:  Precautions Precautions: Fall, Other (comment) Precaution Comments: R hemiparesis, chronic LBP Restrictions Weight Bearing Restrictions: No   Pain:  Session 1: Increased low back pain in standing described as a "pressure" type pain. Reports L heel burning pain at location of wound - nurse notified. Provided seated rest breaks and modifications of interventions for pain management during session.  Session 2: Continues to grimace when rising to stand due to low back pain - nurse present at end of session for medication administration as needed but pt's preference is to try repositioning for pain management prior to medication.   Therapy/Group: Individual Therapy  Tawana Scale , PT, DPT, NCS, CSRS 06/03/2021, 7:53 AM

## 2021-06-03 NOTE — Progress Notes (Signed)
Occupational Therapy Session Note  Patient Details  Name: [name] [name] MRN: [id number] Date of Birth: 02/26/1926  Today's Date: 06/03/2021 OT Individual Time: 1343-1431 OT Individual Time Calculation (min): 48 min    Short Term Goals: Week 1:  OT Short Term Goal 1 (Week 1): Pt will don shirt with S. OT Short Term Goal 2 (Week 1): Pt will complete toilet transfer with min A. OT Short Term Goal 3 (Week 1): Pt will complete bathing with mod A at sink. OT Short Term Goal 4 (Week 1): Pt will position his RUE when in chair/bed with no more than mod VCs.  Skilled Therapeutic Interventions/Progress Updates:    Pt received asleep supine in bed but easily awakened to voice and, agreeable to therapy. Session focus on transfer retraining, activity tolerance, RUE NMR in prep for improved ADL/IADL/func mobility performance + decreased caregiver burden.  Came to sitting EOB with CGA. Stand-pivot > w/c on his R with heavy min A for upright posture and to manage posterior lean. Self-propelled w/c to and form gym with close S and increased time.  Participated in various therac to promote RUE NMR:  -3 rounds of corn hole seated, tossing bags and picking up with use of reacher with RUE  -BUE ball toss/catch from various distance/vantage points  -basketball  -writing personal information, handwriting is legible, but pt self-rates as "terrible" compared to prior to CVA Short ambulatory transfer with mod A due to R lean and heavy flexed posture > bed, min A to progress BLE back to bed.  Pt left supine in bed with bed alarm engaged, call bell in reach, and all immediate needs met.    Therapy Documentation Precautions:  Precautions Precautions: Fall, Other (comment) Precaution Comments: R hemiparesis, chronic LBP Restrictions Weight Bearing Restrictions: No  Pain:  Chronic LBP, did not rate ADL: See Care Tool for more details.   Therapy/Group: Individual Therapy   A  MS,  OTR/L  06/03/2021, 6:53 AM 

## 2021-06-03 NOTE — Progress Notes (Signed)
Pt already on home CPAP and resting comfortably. RT will cont to monitor as needed.

## 2021-06-03 NOTE — Progress Notes (Signed)
Speech Language Pathology Daily Session Note  Patient Details  Name: [name] [name] MRN: [id number] Date of Birth: 1926-08-05  Today's Date: 06/03/2021 SLP Individual Time: 4356-8616 SLP Individual Time Calculation (min): 55 min  Short Term Goals: Week 1: SLP Short Term Goal 1 (Week 1): Patient will participate in further cognitive-linguistic evaluation SLP Short Term Goal 1 - Progress (Week 1): Met SLP Short Term Goal 2 (Week 1): Patient will produce functional multisyllabic words with implementation of speech strategies (slow rate, seperate syllables) during 75% of occasions with min-to-mod A clinicial cueing/modeling SLP Short Term Goal 3 (Week 1): Patient will use appropriate articulatory accuracy and speech rate when verbally reading/speaking with 50% accuracy given mod verbal cues/modeling. SLP Short Term Goal 4 (Week 1): Pt will complete functional complex problem solving tasks in the areas of medication, money and time management mod I. SLP Short Term Goal 5 (Week 1): Pt will demonstrate selective attention in a mildly-distracting enviornment for 30 minutes mod I. SLP Short Term Goal 6 (Week 1): Pt will participate in higher level word finding tasks and be able to engage in abstract conversation with extra time and Supervision A semantic cues.  Skilled Therapeutic Interventions: Pt seen for skilled ST with focus on cognitive and speech goals, pt upright in wheelchair following PT session. Pt continues to endorse word finding difficulty and spelling changes s/p CVA as well as cognitive decline since ~1 month prior to hospital admission. SLP facilitating simple money calculation task by providing overall mod A cues for 60% accuracy, pt with impairments in awareness of errors during task. Wife present and states pt unable to accurately write letter to family member yesterday and instead dictating to wife to write. Pt provided notebook and encouraged to practice writing high frequency information  (printed name, signature, address, birthdate, etc). Pt with 2 errors during task "[place] Rigg" instead of "[place]" for middle name and misspelling of road name in address. When pt provided verbal cue to "double check" pt able to ID and correct errors on 100% opportunities. Pt reports utilizing computer at home every day and enjoys texting children and grandchildren and would like to target these spelling/typing impairments in future sessions. Pt left in wheelchair with wife present for needs, cont ST POC.  Pain Pain Assessment Pain Scale: 0-10 Pain Score: 0-No pain Pain Type: Chronic pain Pain Location: Back Pain Descriptors / Indicators: Aching;Discomfort Pain Onset: On-going Pain Intervention(s): Repositioned  Therapy/Group: Individual Therapy  Dewaine Conger 06/03/2021, 12:40 PM

## 2021-06-03 NOTE — IPOC Note (Signed)
Overall Plan of Care [facility]) Patient Details Name: [name] [name] MRN: [id number] DOB: Nov 16, 1926  Admitting Diagnosis: Left middle cerebral artery stroke [facility])  Hospital Problems: Principal Problem:   Left middle cerebral artery stroke [facility])     Functional Problem List: Nursing Bladder, Safety, Medication Management, Endurance, Skin Integrity  PT Balance, Nutrition, Skin Integrity, Behavior, Pain, Edema, Perception, Endurance, Safety, Motor, Sensory  OT Balance, Endurance, Motor, Perception, Safety, Sensory, Skin Integrity  SLP Motor  TR         Basic ADL's: OT Eating, Grooming, Bathing, Dressing, Toileting     Advanced  ADL's: OT       Transfers: PT Bed Mobility, Bed to Chair, Car, Manufacturing systems engineer, Metallurgist: PT Ambulation, Emergency planning/management officer, Stairs     Additional Impairments: OT Fuctional Use of Upper Extremity  SLP Communication expression    TR      Anticipated Outcomes Item Anticipated Outcome  Self Feeding set-up A  Swallowing      Basic self-care  S to mod I  Toileting  S   Bathroom Transfers S  Bowel/Bladder  manage bladderl w toileting  Transfers  supervision using LRAD  Locomotion  supervision short household distances using LRAD  Communication  mod I-to-sup A  Cognition     Pain  pain at or below level 4 with prns  Safety/Judgment  manage safety w cues   Therapy Plan: PT Intensity: Minimum of 1-2 x/day ,45 to 90 minutes PT Frequency: 5 out of 7 days PT Duration Estimated Length of Stay: ~2-2.5 weeks OT Intensity: Minimum of 1-2 x/day, 45 to 90 minutes OT Frequency: 5 out of 7 days OT Duration/Estimated Length of Stay: 2-2.5 weeks SLP Intensity: Minumum of 1-2 x/day, 30 to 90 minutes SLP Frequency: 3 to 5 out of 7 days SLP Duration/Estimated Length of Stay: 2-2.5 weeks   Team Interventions: Nursing Interventions Patient/Family Education, Bladder Management, Medication Management, Disease  Management/Prevention, Discharge Planning, Skin Care/Wound Management, Pain Management  PT interventions Ambulation/gait training, Cognitive remediation/compensation, Discharge planning, DME/adaptive equipment instruction, Functional mobility training, Pain management, Psychosocial support, Splinting/orthotics, Therapeutic Activities, UE/LE Strength taining/ROM, Visual/perceptual remediation/compensation, Training and development officer, Community reintegration, Disease management/prevention, Technical sales engineer stimulation, Neuromuscular re-education, Patient/family education, Skin care/wound management, Stair training, Therapeutic Exercise, UE/LE Coordination activities, Wheelchair propulsion/positioning  OT Interventions Training and development officer, Disease mangement/prevention, Neuromuscular re-education, Self Care/advanced ADL retraining, Therapeutic Exercise, Wheelchair propulsion/positioning, UE/LE Strength taining/ROM, Skin care/wound managment, DME/adaptive equipment instruction, Pain management, Community reintegration, Functional electrical stimulation, Patient/family education, Splinting/orthotics, UE/LE Coordination activities, Therapeutic Activities, Psychosocial support, Functional mobility training, Discharge planning  SLP Interventions Speech/Language facilitation, Cueing hierarchy, Patient/family education, Functional tasks, Therapeutic Activities  TR Interventions    SW/CM Interventions     Barriers to Discharge MD  Medical stability  Nursing Home environment access/layout, Decreased caregiver support, Wound Care 2 level elevator main B+B w spouse; caregiver 3 hours/TIW  PT Decreased caregiver support, Incontinence    OT Decreased caregiver support, Incontinence    SLP      SW       Team Discharge Planning: Destination: PT-Home ,OT- Home , SLP-Home Projected Follow-up: PT-Home health PT, 24 hour supervision/assistance, OT-  Home health OT, SLP-Home Health SLP, Outpatient  SLP Projected Equipment Needs: PT-To be determined, OT- To be determined, SLP-None recommended by SLP Equipment Details: PT- , OT-  Patient/family involved in discharge planning: PT- Patient,  OT-Patient, SLP-Patient  MD ELOS: 16-18 days Medical Rehab Prognosis:  Excellent Assessment: The patient has been admitted  for CIR therapies with the diagnosis of left MCA CVA.The team will be addressing functional mobility, strength, stamina, balance, safety, adaptive techniques and equipment, self-care, bowel and bladder mgt, patient and caregiver education. Goals have been set at [facility]. Anticipated discharge destination is home.       See Team Conference Notes for weekly updates to the plan of care

## 2021-06-03 NOTE — Progress Notes (Signed)
PROGRESS NOTE   Subjective/Complaints: Feels that the wound on his left heel is aggravated. Asking nursing to message me when she does dressing change today so I can take a look at the wound  Review of Systems  Constitutional:  Negative for fever.  Respiratory:  Negative for shortness of breath.   Cardiovascular:  Negative for chest pain.  Gastrointestinal:  Negative for abdominal pain, constipation, nausea and vomiting.  Genitourinary:  Positive for hematuria.     Objective:   No results found. Recent Labs    06/01/21 0529 06/02/21 0551 06/03/21 0636  WBC 6.5 5.5  --   HGB 9.1* 10.0* 9.1*  HCT 28.9* 31.9* 29.0*  PLT 160 165  --     Recent Labs    06/02/21 0551 06/03/21 0636  NA 139 140  K 3.5 3.3*  CL 106 106  CO2 26 25  GLUCOSE 112* 115*  BUN 36* 33*  CREATININE 1.74* 1.55*  CALCIUM 9.6 9.2     Intake/Output Summary (Last 24 hours) at 06/03/2021 1520 Last data filed at 06/03/2021 1324 Gross per 24 hour  Intake 480 ml  Output 600 ml  Net -120 ml      Pressure Injury 05/26/21 Heel Right Stage 3 -  Full thickness tissue loss. Subcutaneous fat may be visible but bone, tendon or muscle are NOT exposed. (Active)  05/26/21 (Initially identified 10/22/20; follow at wound clinic; wife doing dressings)   Location: Heel  Location Orientation: Right  Staging: Stage 3 -  Full thickness tissue loss. Subcutaneous fat may be visible but bone, tendon or muscle are NOT exposed.  Wound Description (Comments):   Present on Admission: Yes    Physical Exam: Vital Signs Blood pressure (!) 136/59, pulse (!) 106, temperature (!) 97.5 F (36.4 C), temperature source Oral, resp. rate 16, height 6' (1.829 m), weight 98.2 kg, SpO2 99 %. Gen: no distress, normal appearing HEENT: oral mucosa pink and moist, NCAT Cardio: Reg rate Chest: normal effort, normal rate of breathing Abd: soft, non-distended Ext: no edema Psych:  pleasant, normal affect Skin: intact Neuro: Alert and oriented x4, cranial nerves II through XII intact ,follows commands, able to name 2 out of 2 objects, able to repeat 3 out of 3 words, he does have expressive aphasia and dysarthria, delayed processing, comprehension appears to be normal ,sensation altered in his right upper extremity to light touch, finger-nose normal bilaterally Musculoskeletal: No joint redness or swelling noted Strength 5 out of 5 in bilateral upper extremities 4 out of 5 bilateral hip flexion 5 out of 5 bilateral knee extension Right ankle PF and DF 2 out of 5, left ankle PF 2 out of 5, DF 1 out of 5.  Patient reports this is chronic possibly due to vascular disease Tone is normal No joint swelling noted   Assessment/Plan: 1. Functional deficits which require 3+ hours per day of interdisciplinary therapy in a comprehensive inpatient rehab setting. Physiatrist is providing close team supervision and 24 hour management of active medical problems listed below. Physiatrist and rehab team continue to assess barriers to discharge/monitor patient progress toward functional and medical goals  Care Tool:  Bathing    Body  parts bathed by patient: Right arm, Left arm, Chest, Abdomen, Front perineal area, Buttocks, Right upper leg, Left upper leg, Face         Bathing assist Assist Level: Minimal Assistance - Patient > 75%     Upper Body Dressing/Undressing Upper body dressing   What is the patient wearing?: Pull over shirt    Upper body assist Assist Level: Minimal Assistance - Patient > 75%    Lower Body Dressing/Undressing Lower body dressing      What is the patient wearing?: Underwear/pull up, Pants     Lower body assist Assist for lower body dressing: Moderate Assistance - Patient 50 - 74%     Toileting Toileting Toileting Activity did not occur (Clothing management and hygiene only): N/A (no void or bm)  Toileting assist Assist for toileting: Maximal  Assistance - Patient 25 - 49%     Transfers Chair/bed transfer  Transfers assist     Chair/bed transfer assist level: Moderate Assistance - Patient 50 - 74% (stand pivot)     Locomotion Ambulation   Ambulation assist   Ambulation activity did not occur: Safety/medical concerns          Walk 10 feet activity   Assist  Walk 10 feet activity did not occur: Safety/medical concerns        Walk 50 feet activity   Assist Walk 50 feet with 2 turns activity did not occur: Safety/medical concerns         Walk 150 feet activity   Assist Walk 150 feet activity did not occur: Safety/medical concerns         Walk 10 feet on uneven surface  activity   Assist Walk 10 feet on uneven surfaces activity did not occur: Safety/medical concerns         Wheelchair     Assist Is the patient using a wheelchair?: Yes Type of Wheelchair: Manual    Wheelchair assist level: Supervision/Verbal cueing, Set up assist Max wheelchair distance: 138f    Wheelchair 50 feet with 2 turns activity    Assist        Assist Level: Supervision/Verbal cueing   Wheelchair 150 feet activity     Assist      Assist Level: Minimal Assistance - Patient > 75%   Blood pressure (!) 136/59, pulse (!) 106, temperature (!) 97.5 F (36.4 C), temperature source Oral, resp. rate 16, height 6' (1.829 m), weight 98.2 kg, SpO2 99 %.  General: Alert and oriented x 3, No apparent distress, sitting in chair HEENT:, oral mucosa pink and moist Neck: Supple  Heart: Reg rate and rhythm. No murmurs rubs or gallops Chest: CTA bilaterally without wheezes, rales, or rhonchi; no distress Abdomen: Soft, mildly tender, non-distended, bowel sounds positive. Extremities: No clubbing, cyanosis, or edema.  He has small wounds on his right thigh, dorsal foot covered in dressing, R heel wound,  he has a wrapped clean wound on his left lower extremity from a Mohs surgery, bruising on dorsal  bilateral upper extremities Psych: Pt's affect is appropriate. Pt is cooperative Skin: warm and dry, as above Neuro: Alert and oriented x4, cranial nerves II through XII intact ,follows commands, able to name 2 out of 2 objects, able to repeat 3 out of 3 words, he does have expressive aphasia and dysarthria, delayed processing, comprehension appears to be normal ,sensation altered in his right upper extremity to light touch, finger-nose normal bilaterally Musculoskeletal: No joint redness or swelling noted Strength 5 out of 5  in bilateral upper extremities 4 out of 5 bilateral hip flexion 5 out of 5 bilateral knee extension Right ankle PF and DF 2 out of 5, left ankle PF 2 out of 5, DF 1 out of 5.  Patient reports this is chronic possibly due to vascular disease Tone is normal No joint swelling noted  GU bloody urine noted   Medical Problem List and Plan: 1. Functional deficits secondary to left MCA embolic CVA             -patient may shower             -ELOS/Goals: 16-18 days PT/OT Min A wheelchair level; SLP Mod I  -Continue PT, OT and SLP 2.  Antithrombotics: -DVT/anticoagulation:  Pharmaceutical: Other (comment) Eliquis now, previously on xarelto             -antiplatelet therapy: N/A 3. Pain: continue Lidoderm patch, Neurontin 200 mg twice daily and 100 mg nightly, tramadol as needed 4. Mood: Namenda 10 mg twice daily, Razadyne 8 mg daily             -antipsychotic agents: N/A 5. Neuropsych: This patient is capable of making decisions on his own behalf. 6. Chronic lower extremity wounds. LLE wound from Mohs procedure. Follow-up wound care nurse. Will examine today.  -Consult for unna boot replacement 7. Fluids/Electrolytes/Nutrition: Routine in and outs with follow-up chemistries 8.  Atrial fibrillation.  Amiodarone 200 mg daily.  Continue Eliquis.  Cardiac rate controlled 9.  CAD/CABG 2014.  No chest pain or shortness of breath.Continue Imdur 60 mg daily -Denies chest pain 10.   History of severe aortic stenosis status post TAVR procedure 2021.  Follow-up cardiology services 11.  History of PMR.  Currently maintained on ARAVA 10 mg daily as well as chronic prednisone 3 mg daily 12.  BPH.  Proscar 5 mg daily.  Check PVR, Has been using external catheter 13.  Hyperlipidemia.  Lipitor/Zetia 14.  Chronic systolic congestive heart failure.  Demadex 40 mg daily. Imdur '60mg'$  daily. Monitor for signs of fluid overload 15.  OSA.  CPAP 16.  GERD.  Protonix 17.  PAD with lymphedema status post balloon angioplasty of posterior tibial artery October 2022.  Follow-up outpatient 18.  Chronic anemia.  Followed by hematology services.  He had been receiving Epogen injections as an outpatient.             -6/1 Hgb 9.1  -6/2 Hgb stable  at 10    Latest Ref Rng & Units 06/03/2021    6:36 AM 06/02/2021    5:51 AM 06/01/2021    5:29 AM  CBC  WBC 4.0 - 10.5 K/uL  5.5   6.5    Hemoglobin 13.0 - 17.0 g/dL 9.1   10.0   9.1    Hematocrit 39.0 - 52.0 % 29.0   31.9   28.9    Platelets 150 - 400 K/uL  165   160      19. Dementia, continue galantamine and memantine 20. CKD IIIB, avoid nephrotoxic medications Lab Results  Component Value Date   CREATININE 1.55 (H) 06/03/2021   CREATININE 1.74 (H) 06/02/2021   CREATININE 1.89 (H) 06/01/2021  6/1 Cr increased, repeat tomorrow, hold torsemide 6/2 Cr a little improved to 1.74, follow 21. HTN continue imdur -BP overall well controlled 22. Hematuria, likely related to anticoagulation  -Called Urology, will start Foley with irrigations  -Repeat H/H tomorrow  -Will call urology back if this persists 23. Hypokalemia: supplement 80mq K+  on 6/3    LOS: 3 days A FACE TO FACE EVALUATION WAS PERFORMED  Kharisma Glasner P Mikhael Hendriks 06/03/2021, 3:20 PM

## 2021-06-04 ENCOUNTER — Encounter (HOSPITAL_COMMUNITY): Payer: Self-pay | Admitting: Physical Medicine & Rehabilitation

## 2021-06-04 MED ORDER — POLYETHYLENE GLYCOL 3350 17 G PO PACK
17.0000 g | PACK | Freq: Every day | ORAL | Status: DC | PRN
  Administered 2021-06-04 – 2021-06-12 (×4): 17 g via ORAL
  Filled 2021-06-04 (×4): qty 1

## 2021-06-04 MED ORDER — POTASSIUM CHLORIDE CRYS ER 20 MEQ PO TBCR
40.0000 meq | EXTENDED_RELEASE_TABLET | ORAL | Status: AC
  Administered 2021-06-04 (×2): 40 meq via ORAL
  Filled 2021-06-04 (×2): qty 2

## 2021-06-04 NOTE — Progress Notes (Signed)
Pt already on Home CPAP and tolerating well upon my arrival.

## 2021-06-04 NOTE — Progress Notes (Signed)
PROGRESS NOTE   Subjective/Complaints: Patient is adamant about dressing change being removed today, discussed with nursing and we do not have the right mesh for the change. Reviewed Dr. Buelah Manis note and wound care nurse has been following, consulted for dressing change today  Review of Systems  Constitutional:  Negative for fever.  Respiratory:  Negative for shortness of breath.   Cardiovascular:  Negative for chest pain.  Gastrointestinal:  Negative for abdominal pain, constipation, nausea and vomiting.  Genitourinary:  Negative for hematuria.     Objective:   No results found. Recent Labs    06/02/21 0551 06/03/21 0636  WBC 5.5  --   HGB 10.0* 9.1*  HCT 31.9* 29.0*  PLT 165  --     Recent Labs    06/02/21 0551 06/03/21 0636  NA 139 140  K 3.5 3.3*  CL 106 106  CO2 26 25  GLUCOSE 112* 115*  BUN 36* 33*  CREATININE 1.74* 1.55*  CALCIUM 9.6 9.2     Intake/Output Summary (Last 24 hours) at 06/04/2021 1418 Last data filed at 06/04/2021 1314 Gross per 24 hour  Intake 960 ml  Output 1550 ml  Net -590 ml          Physical Exam: Vital Signs Blood pressure 133/60, pulse 62, temperature 98 F (36.7 C), temperature source Oral, resp. rate 15, height 6' (1.829 m), weight 98.2 kg, SpO2 99 %. Gen: no distress, normal appearing HEENT: oral mucosa pink and moist, NCAT Cardio: Reg rate Chest: normal effort, normal rate of breathing Abd: soft, non-distended Ext: no edema Psych: pleasant, normal affect Skin: intact Neuro: Alert and oriented x4, cranial nerves II through XII intact ,follows commands, able to name 2 out of 2 objects, able to repeat 3 out of 3 words, he does have expressive aphasia and dysarthria, delayed processing, comprehension appears to be normal ,sensation altered in his right upper extremity to light touch, finger-nose normal bilaterally Musculoskeletal: No joint redness or swelling  noted Strength 5 out of 5 in bilateral upper extremities 4 out of 5 bilateral hip flexion 5 out of 5 bilateral knee extension Right ankle PF and DF 2 out of 5, left ankle PF 2 out of 5, DF 1 out of 5.  Patient reports this is chronic possibly due to vascular disease Tone is normal No joint swelling noted   Assessment/Plan: 1. Functional deficits which require 3+ hours per day of interdisciplinary therapy in a comprehensive inpatient rehab setting. Physiatrist is providing close team supervision and 24 hour management of active medical problems listed below. Physiatrist and rehab team continue to assess barriers to discharge/monitor patient progress toward functional and medical goals  Care Tool:  Bathing    Body parts bathed by patient: Right arm, Left arm, Chest, Abdomen, Front perineal area, Buttocks, Right upper leg, Left upper leg, Face         Bathing assist Assist Level: Minimal Assistance - Patient > 75%     Upper Body Dressing/Undressing Upper body dressing   What is the patient wearing?: Pull over shirt    Upper body assist Assist Level: Minimal Assistance - Patient > 75%    Lower Body Dressing/Undressing  Lower body dressing      What is the patient wearing?: Underwear/pull up, Pants     Lower body assist Assist for lower body dressing: Moderate Assistance - Patient 50 - 74%     Toileting Toileting Toileting Activity did not occur (Clothing management and hygiene only): N/A (no void or bm)  Toileting assist Assist for toileting: Maximal Assistance - Patient 25 - 49%     Transfers Chair/bed transfer  Transfers assist     Chair/bed transfer assist level: Moderate Assistance - Patient 50 - 74% (stand pivot)     Locomotion Ambulation   Ambulation assist   Ambulation activity did not occur: Safety/medical concerns          Walk 10 feet activity   Assist  Walk 10 feet activity did not occur: Safety/medical concerns        Walk 50 feet  activity   Assist Walk 50 feet with 2 turns activity did not occur: Safety/medical concerns         Walk 150 feet activity   Assist Walk 150 feet activity did not occur: Safety/medical concerns         Walk 10 feet on uneven surface  activity   Assist Walk 10 feet on uneven surfaces activity did not occur: Safety/medical concerns         Wheelchair     Assist Is the patient using a wheelchair?: Yes Type of Wheelchair: Manual    Wheelchair assist level: Supervision/Verbal cueing, Set up assist Max wheelchair distance: 172f    Wheelchair 50 feet with 2 turns activity    Assist        Assist Level: Supervision/Verbal cueing   Wheelchair 150 feet activity     Assist      Assist Level: Minimal Assistance - Patient > 75%   Blood pressure 133/60, pulse 62, temperature 98 F (36.7 C), temperature source Oral, resp. rate 15, height 6' (1.829 m), weight 98.2 kg, SpO2 99 %.   Medical Problem List and Plan: 1. Functional deficits secondary to left MCA embolic CVA             -patient may shower             -ELOS/Goals: 16-18 days PT/OT Min A wheelchair level; SLP Mod I  -Continue PT, OT and SLP 2.  Antithrombotics: -DVT/anticoagulation:  Pharmaceutical: Other (comment) Eliquis now, previously on xarelto             -antiplatelet therapy: N/A 3. Pain: continue Lidoderm patch, Neurontin 200 mg twice daily and 100 mg nightly, tramadol as needed 4. Mood: Namenda 10 mg twice daily, Razadyne 8 mg daily             -antipsychotic agents: N/A 5. Neuropsych: This patient is capable of making decisions on his own behalf. 6. Chronic lower extremity wounds. LLE wound from Mohs procedure. Follow-up wound care nurse. Will examine today. Consulted wound care nurse to perform dressing change today given worsening pain -Consult for unna boot replacement 7. Fluids/Electrolytes/Nutrition: Routine in and outs with follow-up chemistries 8.  Atrial fibrillation.   Amiodarone 200 mg daily.  Continue Eliquis.  Cardiac rate controlled 9.  CAD/CABG 2014.  No chest pain or shortness of breath.Continue Imdur 60 mg daily -Denies chest pain 10.  History of severe aortic stenosis status post TAVR procedure 2021.  Follow-up cardiology services 11.  History of PMR.  Currently maintained on ARAVA 10 mg daily as well as chronic prednisone 3 mg daily  12.  BPH.  Proscar 5 mg daily.  Check PVR, Has been using external catheter 13.  Hyperlipidemia.  Lipitor/Zetia 14.  Chronic systolic congestive heart failure.  Demadex 40 mg daily. Imdur '60mg'$  daily. Monitor for signs of fluid overload 15.  OSA.  CPAP 16.  GERD.  Protonix 17.  PAD with lymphedema status post balloon angioplasty of posterior tibial artery October 2022.  Follow-up outpatient 18.  Chronic anemia.  Followed by hematology services.  He had been receiving Epogen injections as an outpatient.             -6/1 Hgb 9.1  -6/2 Hgb stable  at 10    Latest Ref Rng & Units 06/03/2021    6:36 AM 06/02/2021    5:51 AM 06/01/2021    5:29 AM  CBC  WBC 4.0 - 10.5 K/uL  5.5   6.5    Hemoglobin 13.0 - 17.0 g/dL 9.1   10.0   9.1    Hematocrit 39.0 - 52.0 % 29.0   31.9   28.9    Platelets 150 - 400 K/uL  165   160      19. Dementia, continue galantamine and memantine 20. CKD IIIB, avoid nephrotoxic medications Lab Results  Component Value Date   CREATININE 1.55 (H) 06/03/2021   CREATININE 1.74 (H) 06/02/2021   CREATININE 1.89 (H) 06/01/2021  6/1 Cr increased, repeat tomorrow, hold torsemide 6/2 Cr a little improved to 1.74, follow 21. HTN continue imdur -BP overall well controlled 22. Hematuria, likely related to anticoagulation  -Called Urology, will start Foley with irrigations  -Repeat H/H tomorrow  -Will call urology back if this persists 23. Hypokalemia: supplement 78mq K+ on 6/3, repeat K+ tomorrow 24. Constipation: add miralax daily prn.    LOS: 4 days A FACE TO FACE EVALUATION WAS PERFORMED  KMartha Clan P Karn Derk 06/04/2021, 2:18 PM

## 2021-06-05 LAB — BASIC METABOLIC PANEL
Anion gap: 5 (ref 5–15)
BUN: 29 mg/dL — ABNORMAL HIGH (ref 8–23)
CO2: 25 mmol/L (ref 22–32)
Calcium: 8.8 mg/dL — ABNORMAL LOW (ref 8.9–10.3)
Chloride: 108 mmol/L (ref 98–111)
Creatinine, Ser: 1.41 mg/dL — ABNORMAL HIGH (ref 0.61–1.24)
GFR, Estimated: 46 mL/min — ABNORMAL LOW (ref >60)
Glucose, Bld: 112 mg/dL — ABNORMAL HIGH (ref 70–99)
Potassium: 4.2 mmol/L (ref 3.5–5.1)
Sodium: 138 mmol/L (ref 135–145)

## 2021-06-05 MED ORDER — TAMSULOSIN HCL 0.4 MG PO CAPS
0.4000 mg | ORAL_CAPSULE | Freq: Every day | ORAL | Status: DC
  Administered 2021-06-05 – 2021-06-13 (×9): 0.4 mg via ORAL
  Filled 2021-06-05 (×9): qty 1

## 2021-06-05 MED ORDER — SENNOSIDES-DOCUSATE SODIUM 8.6-50 MG PO TABS
1.0000 | ORAL_TABLET | Freq: Every day | ORAL | Status: DC
  Administered 2021-06-05 – 2021-06-13 (×9): 1 via ORAL
  Filled 2021-06-05 (×9): qty 1

## 2021-06-05 NOTE — Progress Notes (Signed)
PROGRESS NOTE   Subjective/Complaints:  Discussed Left ankle wound which has been there for ~31moper pt, has chronic Bilateral leg swelling   Review of Systems  Constitutional:  Negative for fever.  Respiratory:  Negative for shortness of breath.   Cardiovascular:  Negative for chest pain.  Gastrointestinal:  Negative for abdominal pain, constipation, nausea and vomiting.  Genitourinary:  Negative for hematuria.     Objective:   No results found. Recent Labs    06/03/21 0636  HGB 9.1*  HCT 29.0*    Recent Labs    06/03/21 0636 06/05/21 0640  NA 140 138  K 3.3* 4.2  CL 106 108  CO2 25 25  GLUCOSE 115* 112*  BUN 33* 29*  CREATININE 1.55* 1.41*  CALCIUM 9.2 8.8*     Intake/Output Summary (Last 24 hours) at 06/05/2021 0858 Last data filed at 06/05/2021 0006 Gross per 24 hour  Intake 480 ml  Output 1700 ml  Net -1220 ml      Pressure Injury 05/26/21 Heel Left Stage 3 -  Full thickness tissue loss. Subcutaneous fat may be visible but bone, tendon or muscle are NOT exposed. (Active)  05/26/21 (Initially identified 10/22/20; follow at wound clinic; wife doing dressings)   Location: Heel  Location Orientation: Left  Staging: Stage 3 -  Full thickness tissue loss. Subcutaneous fat may be visible but bone, tendon or muscle are NOT exposed.  Wound Description (Comments):   Present on Admission: Yes     Physical Exam: Vital Signs Blood pressure 125/70, pulse (!) 56, temperature 97.7 F (36.5 C), temperature source Oral, resp. rate 18, height 6' (1.829 m), weight 98.2 kg, SpO2 98 %.  General: No acute distress Mood and affect are appropriate Heart: Regular rate and rhythm no rubs murmurs or extra sounds Lungs: Clear to auscultation, breathing unlabored, no rales or wheezes Abdomen: Positive bowel sounds, soft nontender to palpation, nondistended Extremities: No clubbing, cyanosis, or edema Skin: gr 2 decub,  with scabbing RIght heel Left pre tib surgical excision with granulating tissue , ~2cm diameter no drainage , Let ant ankle venous stasis ulcer granulating well    Stasis dermatitis BLE pre tib  Neurologic: Cranial nerves II through XII intact, motor strength is 5/5 in bilateral deltoid, bicep, tricep, grip, hip flexor, knee extensors, ankle dorsiflexor and plantar flexor Sensory exam reduced sensation left toes   Neuro: Alert and oriented x4, cranial nerves II through XII intact ,follows commands, able to name 2 out of 2 objects, able to repeat 3 out of 3 words, he does have expressive aphasia and dysarthria, delayed processing, comprehension appears to be normal ,sensation altered in his right upper extremity to light touch, finger-nose normal bilaterally Musculoskeletal: No joint redness or swelling noted Strength 5 out of 5 in bilateral upper extremities 4 out of 5 bilateral hip flexion 5 out of 5 bilateral knee extension Right ankle PF and DF 2 out of 5, left ankle PF 2 out of 5, DF 1 out of 5.  Tone is normal No joint swelling noted   Assessment/Plan: 1. Functional deficits which require 3+ hours per day of interdisciplinary therapy in a comprehensive inpatient rehab setting. Physiatrist  is providing close team supervision and 24 hour management of active medical problems listed below. Physiatrist and rehab team continue to assess barriers to discharge/monitor patient progress toward functional and medical goals  Care Tool:  Bathing    Body parts bathed by patient: Right arm, Left arm, Chest, Abdomen, Front perineal area, Buttocks, Right upper leg, Left upper leg, Face         Bathing assist Assist Level: Minimal Assistance - Patient > 75%     Upper Body Dressing/Undressing Upper body dressing   What is the patient wearing?: Pull over shirt    Upper body assist Assist Level: Minimal Assistance - Patient > 75%    Lower Body Dressing/Undressing Lower body dressing       What is the patient wearing?: Underwear/pull up, Pants     Lower body assist Assist for lower body dressing: Moderate Assistance - Patient 50 - 74%     Toileting Toileting Toileting Activity did not occur (Clothing management and hygiene only): N/A (no void or bm)  Toileting assist Assist for toileting: Maximal Assistance - Patient 25 - 49%     Transfers Chair/bed transfer  Transfers assist     Chair/bed transfer assist level: Moderate Assistance - Patient 50 - 74% (stand pivot)     Locomotion Ambulation   Ambulation assist   Ambulation activity did not occur: Safety/medical concerns          Walk 10 feet activity   Assist  Walk 10 feet activity did not occur: Safety/medical concerns        Walk 50 feet activity   Assist Walk 50 feet with 2 turns activity did not occur: Safety/medical concerns         Walk 150 feet activity   Assist Walk 150 feet activity did not occur: Safety/medical concerns         Walk 10 feet on uneven surface  activity   Assist Walk 10 feet on uneven surfaces activity did not occur: Safety/medical concerns         Wheelchair     Assist Is the patient using a wheelchair?: Yes Type of Wheelchair: Manual    Wheelchair assist level: Supervision/Verbal cueing, Set up assist Max wheelchair distance: 159f    Wheelchair 50 feet with 2 turns activity    Assist        Assist Level: Supervision/Verbal cueing   Wheelchair 150 feet activity     Assist      Assist Level: Minimal Assistance - Patient > 75%   Blood pressure 125/70, pulse (!) 56, temperature 97.7 F (36.5 C), temperature source Oral, resp. rate 18, height 6' (1.829 m), weight 98.2 kg, SpO2 98 %.   Medical Problem List and Plan: 1. Functional deficits secondary to left MCA embolic CVA             -patient may shower             -ELOS/Goals: 16-18 days PT/OT Min A wheelchair level; SLP Mod I  -Continue PT, OT and SLP 2.   Antithrombotics: -DVT/anticoagulation:  Pharmaceutical: Other (comment) Eliquis now, previously on xarelto             -antiplatelet therapy: N/A 3. Pain: continue Lidoderm patch, Neurontin 200 mg twice daily and 100 mg nightly, tramadol as needed 4. Mood: Namenda 10 mg twice daily, Razadyne 8 mg daily             -antipsychotic agents: N/A 5. Neuropsych: This patient is capable of  making decisions on his own behalf. 6. Chronic lower extremity wounds. LLE wound from Mohs procedure. Follow-up wound care nurse. Will examine today. Consulted wound care nurse to perform dressing change today given worsening pain -Consult for unna boot replacement Vaseline gauze , 2x2, all covered by Unna boot change weekly  7. Fluids/Electrolytes/Nutrition: Routine in and outs with follow-up chemistries 8.  Atrial fibrillation.  Amiodarone 200 mg daily.  Continue Eliquis.  Cardiac rate controlled 9.  CAD/CABG 2014.  No chest pain or shortness of breath.Continue Imdur 60 mg daily -Denies chest pain 10.  History of severe aortic stenosis status post TAVR procedure 2021.  Follow-up cardiology services 11.  History of PMR.  Currently maintained on ARAVA 10 mg daily as well as chronic prednisone 3 mg daily 12.  BPH.  Proscar 5 mg daily.  Check PVR, Has been using external catheter 13.  Hyperlipidemia.  Lipitor/Zetia 14.  Chronic systolic congestive heart failure.  Demadex 40 mg daily. Imdur '60mg'$  daily. Monitor for signs of fluid overload 15.  OSA.  CPAP 16.  GERD.  Protonix 17.  PAD with lymphedema status post balloon angioplasty of posterior tibial artery October 2022.  Follow-up outpatient 18.  Chronic anemia.  Followed by hematology services.  He had been receiving Epogen injections as an outpatient.             -6/1 Hgb 9.1  -6/2 Hgb stable  at 10    Latest Ref Rng & Units 06/03/2021    6:36 AM 06/02/2021    5:51 AM 06/01/2021    5:29 AM  CBC  WBC 4.0 - 10.5 K/uL  5.5   6.5    Hemoglobin 13.0 - 17.0 g/dL 9.1    10.0   9.1    Hematocrit 39.0 - 52.0 % 29.0   31.9   28.9    Platelets 150 - 400 K/uL  165   160      19. Dementia, continue galantamine and memantine 20. CKD IIIB, avoid nephrotoxic medications Lab Results  Component Value Date   CREATININE 1.41 (H) 06/05/2021   CREATININE 1.55 (H) 06/03/2021   CREATININE 1.74 (H) 06/02/2021  6/1 Cr increased, repeat tomorrow, hold torsemide 6/2 Cr a little improved to 1.74, follow 21. HTN continue imdur -BP overall well controlled 22. Hematuria, likely related to anticoagulation  -Called Urology, will start Foley with irrigations  Voiding trial in ~2 d 23. Hypokalemia: supplement 54mq K+ on 6/3, repeat K+ tomorrow 24. Constipation: add miralax daily prn.    LOS: 5 days A FACE TO FACE EVALUATION WAS PERFORMED  ACharlett Blake6/05/2021, 8:58 AM

## 2021-06-05 NOTE — Progress Notes (Signed)
Occupational Therapy Session Note  Patient Details  Name: [name] [name] MRN: [id number] Date of Birth: Jun 16, 1926  Today's Date: 06/05/2021 OT Individual Time: 4034-7425 session 1 OT Individual Time Calculation (min): 73 min  Session 2: 1417-1445   Short Term Goals: Week 1:  OT Short Term Goal 1 (Week 1): Pt will don shirt with S. OT Short Term Goal 2 (Week 1): Pt will complete toilet transfer with min A. OT Short Term Goal 3 (Week 1): Pt will complete bathing with mod A at sink. OT Short Term Goal 4 (Week 1): Pt will position his RUE when in chair/bed with no more than mod VCs.  Skilled Therapeutic Interventions/Progress Updates: Session 1: Pt greeted supine in bed reporting that he is waiting for ortho tech to don unna boots therefore session limited to bed level however pt    agreeable to OT intervention. Session focus on BADL reeducation,RUE coordination, problem solving, and IADLs.         Pt did complete grooming tasks from bed level with set- up assist as well as UB dressing with MIN A to pull shirt down in back as dressing limited to bed level. Pt completed 9HPT from bed level to assess R vs L UE FMC with results as indicated below: LUE 39 secs RUE 1 min 26 secs  Pt also completed pill box assessment from bed level with results as indicated below: Pt failed the assessment d/t pt not meeting the allotted amount of time of 5 mins, pt needed 17 mins to complete task ( wife reports it has always taken him increased time to complete this task), however pt demonstrated fair problem solving skills noted to present with focused on attention on task and asked for assistance with positioning  and compensatory strategies such as asking for a dish to place pills in and then count out appropriate number from dish. Pt completed one pill bottle at a time, pt was noted to drop several pills during task but aware of mistake and asked for assistance as needed to [facility] deficits. Recommended at this time  that pt complete med mgmt at home with supervision.   Pt left supine in bed with bed alarm activated and all needs within reach.             Session 2: Pt greeted seated in w/c nodding off in chair, pt agreeable to OT intervention. Session focus on functional mobility, implementing written HEP for theraputty. Pt completed sit>stands during session with heavy MIN A and x2 attempts to fully come into standing from w/c d/t back pain. Pt completed stand pivot back to bed with Rw and MINA, MIN cues for safety as pt sat prematurely against EOB. Pt requested to doff clothes needing to stand again from EOB with more of MOD A d/t fatigue and pain, MIN A to doff pants in standing. Pt doffed shirt with MIN A. Pt returned to supine with MIN A needing assist to elevate BLEs.  Once in bed, issued pt written HEP to increase carryover with use of theraputty. Had pt teachback HEP in ensure carryover.  pt left supine in bed with bed alarm activated and all needs within reach.                 Therapy Documentation Precautions:  Precautions Precautions: Fall, Other (comment) Precaution Comments: R hemiparesis, chronic LBP Restrictions Weight Bearing Restrictions: No  Pain: Session 1 : no pain reported  Session 2: 3/10 back pain reported, rest breaks/repositioning provided  as needed.     Therapy/Group: Individual Therapy  [name] 06/05/2021, 12:11 PM

## 2021-06-05 NOTE — Progress Notes (Signed)
[facility] Individual Statement of Services  Patient Name:  [name] [name]  Date:  06/05/2021  Welcome to the [facility].  Our goal is to provide you with an individualized program based on your diagnosis and situation, designed to meet your specific needs.  With this comprehensive rehabilitation program, you will be expected to participate in at least 3 hours of rehabilitation therapies Monday-Friday, with modified therapy programming on the weekends.  Your rehabilitation program will include the following services:  Physical Therapy (PT), Occupational Therapy (OT), Speech Therapy (ST), 24 hour per day rehabilitation nursing, Therapeutic Recreaction (TR), Neuropsychology, Care Coordinator, Rehabilitation Medicine, Nutrition Services, Pharmacy Services, and Other  Weekly team conferences will be held on Wednesday to discuss your progress.  Your Inpatient Rehabilitation Care Coordinator will talk with you frequently to get your input and to update you on team discussions.  Team conferences with you and your family in attendance may also be held.  Expected length of stay:  16-18 Days  Overall anticipated outcome:  MIN A Wheelchair Level  Depending on your progress and recovery, your program may change. Your Inpatient Rehabilitation Care Coordinator will coordinate services and will keep you informed of any changes. Your Inpatient Rehabilitation Care Coordinator's name and contact numbers are listed  below.  The following services may also be recommended but are not provided by the [facility]:   [facility] will be made to provide these services after discharge if needed.  Arrangements include referral to agencies that provide these services.  Your insurance has been verified to be:   [employer] Your primary doctor is:  [name] , MD  Pertinent  information will be shared with your doctor and your insurance company.  Inpatient Rehabilitation Care Coordinator:  Erlene Quan, [place] or [phone]  Information discussed with and copy given to patient by: Dyanne Iha, 06/05/2021, 12:44 PM

## 2021-06-05 NOTE — Progress Notes (Signed)
Inpatient Rehabilitation Care Coordinator Assessment and Plan Patient Details  Name: [name] [name] MRN: [id number] Date of Birth: 1926-11-20  Today's Date: 06/05/2021  Hospital Problems: Principal Problem:   Left middle cerebral artery stroke [facility])  Past Medical History:  Past Medical History:  Diagnosis Date   AAA (abdominal aortic aneurysm) ([place])    Arthritis    Ascending aorta dilatation ([place]) 03/13/2012   Fusiform dilatation of the ascending thoracic aorta discovered during surgery, max diameter 4.2-4.3 cm   BPH (benign prostatic hypertrophy)    Chronic systolic heart failure ([place]) 03/03/2012   Chronic venous insufficiency    GERD (gastroesophageal reflux disease)    Glaucoma    Gout    History of kidney stones    Hyperlipidemia    Hypertension    Obesity (BMI 30-39.9)    Paroxysmal atrial fibrillation ([place]) 02/29/2012   Recurrent paroxysmal, new-onset    PMR (polymyalgia rheumatica) (H[facility])    Polymyalgia rheumatica ([place]) 02/06/2019   Pre-diabetes    S/P CABG x 3 03/06/2012   LIMA to LAD, SVG to OM, SVG to RCA, EVH via right thigh   S/P Maze operation for atrial fibrillation 03/06/2012   Complete bilateral lesions set using bipolar radiofrequency and cryothermy ablation with clipping of LA appendage   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   s/p TAVR with a 26 mm Edwards via the left subclavian by Drs Buena Irish and Bartle   Severe aortic stenosis 09/29/2019   Sleep apnea    USES CPAP NIGHTLY   Past Surgical History:  Past Surgical History:  Procedure Laterality Date   ABDOMINAL AORTOGRAM W/LOWER EXTREMITY N/A 10/26/2020   Procedure: ABDOMINAL AORTOGRAM W/LOWER EXTREMITY;  Surgeon: Broadus John, MD;  Location: [place] [facility];  Service: Cardiovascular;  Laterality: N/A;   CARDIAC CATHETERIZATION     CATARACT EXTRACTION, BILATERAL     with lens implants   CHOLECYSTECTOMY N/A 03/24/2012   Procedure: LAPAROSCOPIC CHOLECYSTECTOMY WITH  INTRAOPERATIVE CHOLANGIOGRAM;  Surgeon: Zenovia Jarred, MD;  Location: [place];  Service: General;  Laterality: N/A;   CORONARY ARTERY BYPASS GRAFT N/A 03/06/2012   Procedure: CORONARY ARTERY BYPASS GRAFTING (CABG);  Surgeon: Rexene Alberts, MD;  Location: [place];  Service: Open Heart Surgery;  Laterality: N/A;   ENDOVEIN HARVEST OF GREATER SAPHENOUS VEIN Right 03/06/2012   Procedure: ENDOVEIN HARVEST OF GREATER SAPHENOUS VEIN;  Surgeon: Rexene Alberts, MD;  Location: [place];  Service: Open Heart Surgery;  Laterality: Right;   INTRAOPERATIVE TRANSESOPHAGEAL ECHOCARDIOGRAM N/A 03/06/2012   Procedure: INTRAOPERATIVE TRANSESOPHAGEAL ECHOCARDIOGRAM;  Surgeon: Rexene Alberts, MD;  Location: [place];  Service: Open Heart Surgery;  Laterality: N/A;   IR KYPHO LUMBAR INC FX REDUCE BONE BX UNI/BIL CANNULATION INC/IMAGING  02/23/2020   LEFT HEART CATH Right 03/02/2012   Procedure: LEFT HEART CATH;  Surgeon: Sherren Mocha, MD;  Location: [place] C[facility] LAB;  Service: Cardiovascular;  Laterality: Right;   MAZE N/A 03/06/2012   Procedure: MAZE;  Surgeon: Rexene Alberts, MD;  Location: [place];  Service: Open Heart Surgery;  Laterality: N/A;   PERIPHERAL VASCULAR INTERVENTION Right 10/26/2020   Procedure: PERIPHERAL VASCULAR INTERVENTION;  Surgeon: Broadus John, MD;  Location: [place] [facility];  Service: Cardiovascular;  Laterality: Right;   PROSTATECTOMY     partial   RIGHT/LEFT HEART CATH AND CORONARY/GRAFT ANGIOGRAPHY N/A 09/29/2019   Procedure: RIGHT/LEFT HEART CATH AND CORONARY/GRAFT ANGIOGRAPHY;  Surgeon: Adrian Prows, MD;  Location: [place] [facility];  Service: Cardiovascular;  Laterality: N/A;   TEE WITHOUT CARDIOVERSION N/A 11/10/2019   Procedure: TRANSESOPHAGEAL ECHOCARDIOGRAM (TEE);  Surgeon: McAlhany, Christopher D, MD;  Location: [facility];  Service: Open Heart Surgery;  Laterality: N/A;   TRANSCATHETER AORTIC VALVE REPLACEMENT, TRANSFEMORAL  11/10/2019   Social History:  reports that he quit smoking about 58 years  ago. His smoking use included cigarettes. He has a 7.50 pack-year smoking history. He has never been exposed to tobacco smoke. He has never used smokeless tobacco. He reports current alcohol use of about 2.0 standard drinks per week. He reports that he does not use drugs.  Family / Support Systems Spouse/Significant Other: Susan Children: Anita Anticipated Caregiver: Susan Wilson Ability/Limitations of Caregiver: Min/MOD Caregiver Availability: 24/7 Family Dynamics: support from spouse  Social History Preferred language: English Religion: [employer] Literacy - How often do you need to have someone help you when you read instructions, pamphlets, or other written material from your doctor or pharmacy?: Never Writes: Yes   Abuse/Neglect Abuse/Neglect Assessment Can Be Completed: Yes Physical Abuse: Denies Verbal Abuse: Denies Sexual Abuse: Denies Exploitation of patient/patient's resources: Denies Self-Neglect: Denies  Patient response to: Social Isolation - How often do you feel lonely or isolated from those around you?: Never  Emotional Status Recent Psychosocial Issues: coping Psychiatric History: n/a Substance Abuse History: n/a  Patient / Family Perceptions, Expectations & Goals Pt/Family understanding of illness & functional limitations: yes Premorbid pt/family roles/activities: Limited in community. MOD I independent with power chair, ambulating short distances living with spouse. Required assistance with ADLS, steps and cognition Anticipated changes in roles/activities/participation: spouse plans to increase caregivers hours through LTC Pt/family expectations/goals: Min A wheelchair level/MOD I  Community Resources Community Agencies: Other (Comment) (PCA) Premorbid Home Care/DME Agencies: Other (Comment) (Rollator, Rolling Walker, [place] Wheelchair, Grab bars, Shower seat, power wheelchair) Transportation available at discharge: Spouse potentially needs  transportation resources at discharge Is the patient able to respond to transportation needs?: Yes In the past 12 months, has lack of transportation kept you from medical appointments or from getting medications?: Yes In the past 12 months, has lack of transportation kept you from meetings, work, or from getting things needed for daily living?: Yes Resource referrals recommended: Neuropsychology  Discharge Planning Living Arrangements: Spouse/significant other Support Systems: Spouse/significant other Type of Residence: Private residence Insurance Resources: Private Insurance (specify) (BCBS MEDICARE) Financial Resources: Social Security, SSD, Family Support Financial Screen Referred: No Living Expenses: Lives with family Money Management: Patient, Spouse Does the patient have any problems obtaining your medications?: No Home Management: Caregivers and spouse Patient/Family Preliminary Plans: Plans to continue Care Coordinator Barriers to Discharge: Lack of/limited family support, Decreased caregiver support, Insurance for SNF coverage Care Coordinator Anticipated Follow Up Needs: HH/OP DC Planning Additional Notes/Comments: PCA: MWF 3 hours Expected length of stay: 16-18 Days  Clinical Impression SW met with patient and introduced self. Sw left VM for patient spouse providing the same information. Patient plans to discharge home with the spouse. Contact information left in patient room for spouse. No additional questions or concerns. Patient ready to nap. Sw will continue to follow up.    J  06/05/2021, 12:59 PM    

## 2021-06-05 NOTE — Progress Notes (Signed)
Speech Language Pathology Daily Session Note  Patient Details  Name: [name] [name] MRN: [id number] Date of Birth: [birth year]  Today's Date: 06/05/2021 SLP Individual Time: 0321-2248 SLP Individual Time Calculation (min): 34 min  Short Term Goals: Week 1: SLP Short Term Goal 1 (Week 1): Patient will participate in further cognitive-linguistic evaluation SLP Short Term Goal 1 - Progress (Week 1): Met SLP Short Term Goal 2 (Week 1): Patient will produce functional multisyllabic words with implementation of speech strategies (slow rate, seperate syllables) during 75% of occasions with min-to-mod A clinicial cueing/modeling SLP Short Term Goal 3 (Week 1): Patient will use appropriate articulatory accuracy and speech rate when verbally reading/speaking with 50% accuracy given mod verbal cues/modeling. SLP Short Term Goal 4 (Week 1): Pt will complete functional complex problem solving tasks in the areas of medication, money and time management mod I. SLP Short Term Goal 5 (Week 1): Pt will demonstrate selective attention in a mildly-distracting enviornment for 30 minutes mod I. SLP Short Term Goal 6 (Week 1): Pt will participate in higher level word finding tasks and be able to engage in abstract conversation with extra time and Supervision A semantic cues.  Skilled Therapeutic Interventions:Skilled ST services focused on cognitive skills. SLP facilitated mildly complex problem solving skills in calendar tasks, pt was unable to complete task due to medical interference, but 1/2 through task pt appeared to demonstrate mod I problem solving and recall within task. MD entered and pt requested investigation of left leg wound. MD and Nurse provided wound care. Pt demonstrated ability to express thoughts at phrase/sentence level with 75% intelligibility given extra time to produce multisyllable words. Pt missed 11 minutes of ST services due to nursing care. Pt was left in room nursing staff, with call bell within  reach and bed alarm set. SLP recommends to continue skilled services.      Pain Pain Assessment Pain Score: 0-No pain  Therapy/Group: Individual Therapy  Toyna Erisman  [facility] 06/05/2021, 9:19 AM

## 2021-06-05 NOTE — Progress Notes (Signed)
Physical Therapy Session Note  Patient Details  Name: [name] [name] MRN: [id number] Date of Birth: 08/05/1926  Today's Date: 06/05/2021 PT Individual Time: 1300-1400 PT Individual Time Calculation (min): 60 min   Short Term Goals: Week 1:  PT Short Term Goal 1 (Week 1): Pt will consistently perform supine<>sit with min assist PT Short Term Goal 2 (Week 1): Pt will perform sit<>stands using LRAD with CGA consistently PT Short Term Goal 3 (Week 1): Pt will perform bed<>chair transfers using LRAD with CGA consistently PT Short Term Goal 4 (Week 1): Pt will ambulate at least 61f using LRAD with min assist of 1 and +2 w/c follow if needed  Skilled Therapeutic Interventions/Progress Updates:   Engaged in bed mobility retraining with min assist to facilitate at trunk coming to the R. EOB demonstrates dynamic balance with close supervision. Discussed with RN about disconnecting from foley flush to be able to don pants. Performed squat pivot transfer to w/c with min assist. Sit <> stands with RW with min to mod assist but poor standing tolerance requiring 3 reps to remove gowns and pull up pants. Rest breaks between due to pain and fatigue.  W/c mobility with BUE for functional strengthening and endurance x 150' to and from therapy gyms.   NMR for sit <> stand and standing tolerance/pre-gait activities in parallel bars with min to mod assist but pt heavily pulling on bars vs being able to push up from chair x 4 reps each trial progressing to marching in place x 5 times each side. Seated breaks with decreased eccentric control when returning to seated position. Cues for upright posture as maintains flexed posture.   Focused on seated UE and core strengthening therex to aid with overall mobility with 4 lb straight weight including bicep curls, overhead press, and diagnonals R and L x 10 reps each with cues for technique.   End of session set up in w/c with all needs in place before next session.    Therapy Documentation Precautions:  Precautions Precautions: Fall, Other (comment) Precaution Comments: R hemiparesis, chronic LBP Restrictions Weight Bearing Restrictions: No  Pain: Reports ongoing chronic back pain which does limit and impact tolerance to upright mobility. Premedicated.    Therapy/Group: Individual Therapy  GCanary BrimBIvory [name] PT, DPT, CBIS  06/05/2021, 2:07 PM

## 2021-06-05 NOTE — Consult Note (Signed)
WOC updated bedside nurse, LLE wound care and Unna's boot change per orthopedic tech and bedside nurse to reapply topical care.   [place], [place], [place]

## 2021-06-05 NOTE — Progress Notes (Signed)
Orthopedic Tech Progress Note Patient Details:  [name] [name] 26-Aug-1926 [id number]  Ortho Devices Type of Ortho Device: Louretta Parma boot Ortho Device/Splint Location: LLE Ortho Device/Splint Interventions: Ordered, Application   Post Interventions Patient Tolerated: Well Instructions Provided: Care of device  Janit Pagan 06/05/2021, 12:52 PM

## 2021-06-06 NOTE — Progress Notes (Signed)
Physical Therapy Session Note  Patient Details  Name: [name] [name] MRN: [id number] Date of Birth: 16-Feb-1926  Today's Date: 06/06/2021 PT Individual Time: 1110-1206 PT Individual Time Calculation (min): 56 min   Short Term Goals: Week 1:  PT Short Term Goal 1 (Week 1): Pt will consistently perform supine<>sit with min assist PT Short Term Goal 2 (Week 1): Pt will perform sit<>stands using LRAD with CGA consistently PT Short Term Goal 3 (Week 1): Pt will perform bed<>chair transfers using LRAD with CGA consistently PT Short Term Goal 4 (Week 1): Pt will ambulate at least 771f using LRAD with min assist of 1 and +2 w/c follow if needed  Skilled Therapeutic Interventions/Progress Updates:    Pt received sitting in w/c and agreeable to therapy session although reporting increased feeling of "tired" and "pain" in his low back today. Pt reports pre-medicated for pain management - therapist provided rest breaks, breathing and calming strategies as well as repositioning for pain management during session.  Pt states he hasn't worn shoes in almost a year due to the wounds on his LEs as well as the fact that shoes make it more challenging to perform his mobility due to foot drop. Discussed options of external bracing that could be used on tennis shoes to assist with ankle DF to improve ease, independence, and safety with transfers and gait training progression. This therapist would recommend an external brace as opposed to one that would touch his skin due to his hx of multiple lower extremity wounds.   B UE w/c propulsion ~[id number] to/from main therapy gym with 1x seated rest break on the way to gym due to fatigue - supervision for safety but no cuing needed - pt prefers to hold up his feet as opposed to using leg rests.  Pt set-up wheelchair for transfer with min cuing on adjusting the angle of the chair; however, pt prefers to leave it at a 45degree angle from the mat. R partial stand/squat pivot  w/c>EOM with CGA/light min assist for safety/steadying while turning - pt continues to rely heavily on B UE support on armrests to rise and maintain balance while turning - does have increased difficulty stepping feet wearing shoes due to foot drop.  Sit>stand EOM>RW with light mod/heavy min assist for lifting to stand to promote increased anterior weight shift.  L stand pivot EOM>w/c using RW with heavy min assist for balance - very small, shuffled steps due to lack of ankle DF to achieve foot clearance - quick movements with AD at this time and maintains forward flexed posture.  Sit>stand in // bars with min assist progressed to CGA when lowering the bars down to w/c armrest height to allow pt to push-up from them - cuing for increased anterior weight shift while rising.  Gait training ~71f29forward/backwards using // bars x3 reps with light min assist for balance and guarding R knee during stance but no buckling occurred - pt achieves reciprocal stepping pattern with high steppage gait due to lack of ankle DF strength - progresses towards larger reciprocal stepping during backwards gait as well - consistently has excessive forward trunk flexion throughout.  Gait training in // bars for safety but using RW support ~8ft28f with light min assist of 1 and +2 w/c follow for for safety - demos good carryover of gait as described when using // bar support.   Once back in room at end of session, noticed pt has a skin tear on dorsal surface of  R thumb near the [facility] joint likely due to pt rubbing his hands on the wheelchair rims - nurse made aware to apply a dressing.  At end of session, pt left seated in w/c with needs in reach and seat belt alarm on.   Therapy Documentation Precautions:  Precautions Precautions: Fall, Other (comment) Precaution Comments: R hemiparesis, chronic LBP Restrictions Weight Bearing Restrictions: No   Pain: Details above.    Therapy/Group: Individual Therapy  Tawana Scale , PT, DPT, NCS, CSRS 06/06/2021, 8:05 AM

## 2021-06-06 NOTE — Progress Notes (Signed)
Speech Language Pathology Daily Session Note  Patient Details  Name: [name] [name] MRN: [id number] Date of Birth: Aug 06, 1926  Today's Date: 06/06/2021 SLP Individual Time: 9470-7615 SLP Individual Time Calculation (min): 43 min  Short Term Goals: Week 1: SLP Short Term Goal 1 (Week 1): Patient will participate in further cognitive-linguistic evaluation SLP Short Term Goal 1 - Progress (Week 1): Met SLP Short Term Goal 2 (Week 1): Patient will produce functional multisyllabic words with implementation of speech strategies (slow rate, seperate syllables) during 75% of occasions with min-to-mod A clinicial cueing/modeling SLP Short Term Goal 3 (Week 1): Patient will use appropriate articulatory accuracy and speech rate when verbally reading/speaking with 50% accuracy given mod verbal cues/modeling. SLP Short Term Goal 4 (Week 1): Pt will complete functional complex problem solving tasks in the areas of medication, money and time management mod I. SLP Short Term Goal 5 (Week 1): Pt will demonstrate selective attention in a mildly-distracting enviornment for 30 minutes mod I. SLP Short Term Goal 6 (Week 1): Pt will participate in higher level word finding tasks and be able to engage in abstract conversation with extra time and Supervision A semantic cues.  Skilled Therapeutic Interventions:Skilled ST services focused on cognitive skills. SLP facilitated mildly complex problem solving and recall skills in calendar tasks, pt demonstrated mod I for problem solving and supervision A verbal cues for for recall. Pt expressed acute change in the amount of effort required to formulate thoughts into written form as well as reduced legibility due to RUE weakness. Pt demonstrated ability to write at word/phrase level with extra time. SLP began education, verbal problem solving and recall of current medication list, pt demonstrated mod I in verbal problem solving (medication 1-x2 per day) and recall within task  supervision A verbal cues. Pt was unable to complete task due to limited time. Pt demonstrated speech intelligibility at the word level when reading medication list, with 60% accuracy but the ability to self-correct (breaking down syllables) with min A verbal cues increasing to 80% accuracy. Pt was left in room with call bell within reach and chair alarm set. SLP recommends to continue skilled services.     Pain Pain Assessment Pain Scale: 0-10 Pain Score: 0-No pain Pain Type: Chronic pain Pain Location: Back Pain Orientation: Posterior Pain Descriptors / Indicators: Aching Pain Frequency: Constant Pain Onset: Unable to tell Patients Stated Pain Goal: 0 Pain Intervention(s): Medication (See eMAR) Multiple Pain Sites: No  Therapy/Group: Individual Therapy  [name] [name]  [facility] 06/06/2021, 11:30 AM

## 2021-06-06 NOTE — Progress Notes (Signed)
PROGRESS NOTE   Subjective/Complaints:  Slept ok, feels numb in RIght arm   Review of Systems  Constitutional:  Negative for fever.  Respiratory:  Negative for shortness of breath.   Cardiovascular:  Negative for chest pain.  Gastrointestinal:  Negative for abdominal pain, constipation, nausea and vomiting.  Genitourinary:  Negative for hematuria.     Objective:   No results found. No results for input(s): WBC, HGB, HCT, PLT in the last 72 hours.  Recent Labs    06/05/21 0640  NA 138  K 4.2  CL 108  CO2 25  GLUCOSE 112*  BUN 29*  CREATININE 1.41*  CALCIUM 8.8*     Intake/Output Summary (Last 24 hours) at 06/06/2021 0848 Last data filed at 06/06/2021 0725 Gross per 24 hour  Intake 840 ml  Output 1950 ml  Net -1110 ml      Pressure Injury 05/26/21 Heel Left Stage 3 -  Full thickness tissue loss. Subcutaneous fat may be visible but bone, tendon or muscle are NOT exposed. (Active)  05/26/21 (Initially identified 10/22/20; follow at wound clinic; wife doing dressings)   Location: Heel  Location Orientation: Left  Staging: Stage 3 -  Full thickness tissue loss. Subcutaneous fat may be visible but bone, tendon or muscle are NOT exposed.  Wound Description (Comments):   Present on Admission: Yes     Physical Exam: Vital Signs Blood pressure 101/74, pulse 87, temperature 98.6 F (37 C), resp. rate 16, height 6' (1.829 m), weight 98.2 kg, SpO2 99 %.  General: No acute distress Mood and affect are appropriate Heart: Regular rate and rhythm no rubs murmurs or extra sounds Lungs: Clear to auscultation, breathing unlabored, no rales or wheezes Abdomen: Positive bowel sounds, soft nontender to palpation, nondistended Extremities: No clubbing, cyanosis, or edema Skin: gr 2 decub, with scabbing RIght heel Left pre tib surgical excision with granulating tissue , ~2cm diameter no drainage , Let ant ankle venous stasis  ulcer granulating well    Stasis dermatitis BLE pre tib  Neurologic: Cranial nerves II through XII intact, motor strength is 5/5 in bilateral deltoid, bicep, tricep, grip, hip flexor, knee extensors, ankle dorsiflexor and plantar flexor Sensory exam reduced sensation left toes   Neuro: Alert and oriented x4, cranial nerves II through XII intact ,follows commands, able to name 2 out of 2 objects, able to repeat 3 out of 3 words, he does have expressive aphasia and dysarthria, delayed processing, comprehension appears to be normal ,sensation altered in his right upper extremity to light touch, finger-nose normal bilaterally Musculoskeletal: No joint redness or swelling noted Strength 5 out of 5 in bilateral upper extremities 4 out of 5 bilateral hip flexion 5 out of 5 bilateral knee extension Right ankle PF and DF 2 out of 5, left ankle PF 2 out of 5, DF 1 out of 5.  Tone is normal No joint swelling noted   Assessment/Plan: 1. Functional deficits which require 3+ hours per day of interdisciplinary therapy in a comprehensive inpatient rehab setting. Physiatrist is providing close team supervision and 24 hour management of active medical problems listed below. Physiatrist and rehab team continue to assess barriers to discharge/monitor patient  progress toward functional and medical goals  Care Tool:  Bathing    Body parts bathed by patient: Right arm, Left arm, Chest, Abdomen, Front perineal area, Buttocks, Right upper leg, Left upper leg, Face         Bathing assist Assist Level: Minimal Assistance - Patient > 75%     Upper Body Dressing/Undressing Upper body dressing   What is the patient wearing?: Pull over shirt    Upper body assist Assist Level: Minimal Assistance - Patient > 75%    Lower Body Dressing/Undressing Lower body dressing      What is the patient wearing?: Underwear/pull up, Pants     Lower body assist Assist for lower body dressing: Moderate Assistance -  Patient 50 - 74%     Toileting Toileting Toileting Activity did not occur Landscape architect and hygiene only): N/A (no void or bm)  Toileting assist Assist for toileting: Maximal Assistance - Patient 25 - 49%     Transfers Chair/bed transfer  Transfers assist     Chair/bed transfer assist level: Minimal Assistance - Patient > 75%     Locomotion Ambulation   Ambulation assist   Ambulation activity did not occur: Safety/medical concerns          Walk 10 feet activity   Assist  Walk 10 feet activity did not occur: Safety/medical concerns        Walk 50 feet activity   Assist Walk 50 feet with 2 turns activity did not occur: Safety/medical concerns         Walk 150 feet activity   Assist Walk 150 feet activity did not occur: Safety/medical concerns         Walk 10 feet on uneven surface  activity   Assist Walk 10 feet on uneven surfaces activity did not occur: Safety/medical concerns         Wheelchair     Assist Is the patient using a wheelchair?: Yes Type of Wheelchair: Manual    Wheelchair assist level: Independent Max wheelchair distance: 150'    Wheelchair 50 feet with 2 turns activity    Assist        Assist Level: Independent   Wheelchair 150 feet activity     Assist      Assist Level: Independent   Blood pressure 101/74, pulse 87, temperature 98.6 F (37 C), resp. rate 16, height 6' (1.829 m), weight 98.2 kg, SpO2 99 %.   Medical Problem List and Plan: 1. Functional deficits secondary to left MCA embolic CVA             -patient may shower             -ELOS/Goals: 16-18 days PT/OT Min A wheelchair level; SLP Mod I  -Continue PT, OT and SLP 2.  Antithrombotics: -DVT/anticoagulation:  Pharmaceutical: Other (comment) Eliquis now, previously on xarelto             -antiplatelet therapy: N/A 3. Pain: continue Lidoderm patch, Neurontin 200 mg twice daily and 100 mg nightly, tramadol as needed 4. Mood:  Namenda 10 mg twice daily, Razadyne 8 mg daily             -antipsychotic agents: N/A 5. Neuropsych: This patient is capable of making decisions on his own behalf. 6. Chronic lower extremity wounds. LLE wound from Mohs procedure. Follow-up wound care nurse. Will examine today. Consulted wound care nurse to perform dressing change today given worsening pain Appreciate ortho tech,  unna boot replacement Vaseline  gauze , 2x2, all covered by Unna boot change weekly  7. Fluids/Electrolytes/Nutrition: Routine in and outs with follow-up chemistries 8.  Atrial fibrillation.  Amiodarone 200 mg daily.  Continue Eliquis.  Cardiac rate controlled 9.  CAD/CABG 2014.  No chest pain or shortness of breath.Continue Imdur 60 mg daily -Denies chest pain 10.  History of severe aortic stenosis status post TAVR procedure 2021.  Follow-up cardiology services 11.  History of PMR.  Currently maintained on ARAVA 10 mg daily as well as chronic prednisone 3 mg daily 12.  BPH.  Proscar 5 mg daily.  Check PVR, Has been using external catheter 13.  Hyperlipidemia.  Lipitor/Zetia 14.  Chronic systolic congestive heart failure.  Demadex 40 mg daily. Imdur '60mg'$  daily. Monitor for signs of fluid overload 15.  OSA.  CPAP 16.  GERD.  Protonix 17.  PAD with lymphedema status post balloon angioplasty of posterior tibial artery October 2022.  Follow-up outpatient 18.  Chronic anemia.  Followed by hematology services.  He had been receiving Epogen injections as an outpatient.             -6/1 Hgb 9.1  -6/2 Hgb stable  at 10    Latest Ref Rng & Units 06/03/2021    6:36 AM 06/02/2021    5:51 AM 06/01/2021    5:29 AM  CBC  WBC 4.0 - 10.5 K/uL  5.5   6.5    Hemoglobin 13.0 - 17.0 g/dL 9.1   10.0   9.1    Hematocrit 39.0 - 52.0 % 29.0   31.9   28.9    Platelets 150 - 400 K/uL  165   160      19. Dementia, continue galantamine and memantine 20. CKD IIIB, avoid nephrotoxic medications Lab Results  Component Value Date   CREATININE  1.41 (H) 06/05/2021   CREATININE 1.55 (H) 06/03/2021   CREATININE 1.74 (H) 06/02/2021   21. HTN continue imdur -BP overall well controlled 22. Hematuria, likely related to anticoagulation  -Called Urology, will start Foley with irrigations  Voiding trial tomorrow , repeat CBC in am , no sign of blood in foley bag or tubing  23. Hypokalemia: supplement 69mq K+ on 6/3, repeat K+ tomorrow 24. Constipation: add miralax daily prn.    LOS: 6 days A FACE TO FACE EVALUATION WAS PERFORMED  ACharlett Blake6/06/2021, 8:48 AM

## 2021-06-06 NOTE — Plan of Care (Signed)
  Problem: Consults Goal: RH STROKE PATIENT EDUCATION Description: See Patient Education module for education specifics  Outcome: Progressing   Problem: RH BLADDER ELIMINATION Goal: RH STG MANAGE BLADDER WITH ASSISTANCE Description: STG Manage Bladder With toileting Assistance Outcome: Progressing   Problem: RH SKIN INTEGRITY Goal: RH STG SKIN FREE OF INFECTION/BREAKDOWN Description: W min assist Outcome: Progressing Goal: RH STG MAINTAIN SKIN INTEGRITY WITH ASSISTANCE Description: STG Maintain Skin Integrity With min  Assistance. Outcome: Progressing   Problem: RH SAFETY Goal: RH STG ADHERE TO SAFETY PRECAUTIONS W/ASSISTANCE/DEVICE Description: STG Adhere to Safety Precautions With cues Assistance/Device. Outcome: Progressing   Problem: RH PAIN MANAGEMENT Goal: RH STG PAIN MANAGED AT OR BELOW PT'S PAIN GOAL Description: At or below level 4 w prns Outcome: Progressing   Problem: RH KNOWLEDGE DEFICIT Goal: RH STG INCREASE KNOWLEDGE OF DIABETES Description: Patient and wife will be able to manage prediabetes with dietary modifications using handouts and educational resources independently Outcome: Progressing Goal: RH STG INCREASE KNOWLEDGE OF HYPERTENSION Description: Patient and wife will be able to manage HTN  with medications, and dietary modifications using handouts and educational resources independently Outcome: Progressing Goal: RH STG INCREASE KNOWLEGDE OF HYPERLIPIDEMIA Description: Patient and wife will be able to manage HLD  with medications, and dietary modifications using handouts and educational resources independently Outcome: Progressing Goal: RH STG INCREASE KNOWLEDGE OF STROKE PROPHYLAXIS Description: Patient and wife will be able to manage secondary stroke prevention  with medications, and dietary modifications using handouts and educational resources independently Outcome: Progressing

## 2021-06-06 NOTE — Progress Notes (Signed)
Occupational Therapy Session Note  Patient Details  Name: [name] [name] MRN: [id number] Date of Birth: Jan 11, 1926  Today's Date: 06/06/2021 OT Individual Time: 7253-6644 session 1  OT Individual Time Calculation (min): 55 min  Session 2: 1349-1438   Short Term Goals: Week 1:  OT Short Term Goal 1 (Week 1): Pt will don shirt with S. OT Short Term Goal 2 (Week 1): Pt will complete toilet transfer with min A. OT Short Term Goal 3 (Week 1): Pt will complete bathing with mod A at sink. OT Short Term Goal 4 (Week 1): Pt will position his RUE when in chair/bed with no more than mod VCs.  Skilled Therapeutic Interventions/Progress Updates:  Session 1: Pt greeted supine in bed   agreeable to OT intervention. Session focus on BADL reeducation, functional mobility, dynamic standing balance and decreasing overall caregiver burden.                                 Pt completed supine>sit to via log roll with CGA and MIN verbal cues for sequencing task. Pt completed stand pivot to w/c with Rw and MIN A from elevate EOB as pt reports tall bed at home. Total A transport to sink for bathing. Pt completed UB bathing with MIN A. Applied lotion to pts back d/t reports of itching. Pt completed seated oral care with supervision, noted improved RUE [place] during grooming tasks. Pt requesting to have pain patched donned to back d/t pain, alerted RN. Pt donned [place] shirt with set- up assist and + time, MIN A for LB dressing to pull pants to waist line in standing and to thread RLE d/t catheter. -Waiting on pain patch therefore conducted session from room with an emphasis on functional sit>stands at sink. Pt able to stand with MIN A however pt pushes backwards into chair and has + difficulty transitioning BUEs from w/c to RW. Pt completed x2 sit>stands with pt instructed to reach out of BOS to mirror to complete therapeutic activity of playing tic tac toe on mirror with RUE to challenge dynamic balance and work on RUE [facility] and  motor planning. Pt able to stand for ~ 1 min before needing to sit d/t pain.  -D/t pain in back during standing deferred standing until pain patch available and pt completed BUE therex as indicated below to facilitate improved endurance for ADL participation.  X10 shoulder flexion  X10 bicep curls X10 shoulder horizontal ABD X10 alternating punches  Pt left seated in w/c with alarm belt activated and all needs within reach.  Session 2: Pt greeted seated in w/c   agreeable to OT intervention. Pt reports wanting to brush his teeth as he feels like he has food stuck in them. Sat pt up at sink with toothbrush for oral care.  Remainder of session focus on sit>stand in room using bed rail for BUE support. Pt able to complete x5 sit>stands with overall CGA with pt pushing up from w/c and shifting hands from arm rests to bed rail. Pt completed various standing therapeutic activities such as completing RUE [place] task of placing and removing straws in cup with an emphasis on using vision to compensate for lack of sensation in R hand. Pt able to stand for 1 min each trial before needing to sit down.  Pt transported into bathroom d/t reports of needing to void bowels; MIN A for stand pivot to toilet with use of grab bars.  Pt needed MIN A for clothing mgmt during toileting. Pt unable to void bowels despite sitting on toilet for at least 5 mins. MIN A to pivot back to w/c with use of grab bars. Pt transported to EOB where pt completed stand pivot back to EOB with MIN A with Rw. MOD A for sit>supine needing assist to elevate BLEs back to bed. Pt left supine in bed with bed alarm activated and all needs within reach.   Therapy Documentation Precautions:  Precautions Precautions: Fall, Other (comment) Precaution Comments: R hemiparesis, chronic LBP Restrictions Weight Bearing Restrictions: No  Pain: Session 1: unrated back pain, rest breaks provided as needed with RN providing pain patch during session  Session  2: unrated back pain described as burning; rest breaks and repositioning provided as needed.   Therapy/Group: Individual Therapy  Corinne Ports [facility] 06/06/2021, 12:05 PM

## 2021-06-07 ENCOUNTER — Other Ambulatory Visit (HOSPITAL_COMMUNITY): Payer: Self-pay | Admitting: *Deleted

## 2021-06-07 LAB — BASIC METABOLIC PANEL
Anion gap: 9 (ref 5–15)
BUN: 31 mg/dL — ABNORMAL HIGH (ref 8–23)
CO2: 24 mmol/L (ref 22–32)
Calcium: 9 mg/dL (ref 8.9–10.3)
Chloride: 103 mmol/L (ref 98–111)
Creatinine, Ser: 1.71 mg/dL — ABNORMAL HIGH (ref 0.61–1.24)
GFR, Estimated: 37 mL/min — ABNORMAL LOW (ref >60)
Glucose, Bld: 105 mg/dL — ABNORMAL HIGH (ref 70–99)
Potassium: 4.1 mmol/L (ref 3.5–5.1)
Sodium: 136 mmol/L (ref 135–145)

## 2021-06-07 NOTE — Progress Notes (Signed)
Speech Language Pathology Daily Session Note  Patient Details  Name: [name] [name] MRN: [id number] Date of Birth: Feb 23, 1926  Today's Date: 06/07/2021 SLP Individual Time: 9924 to 1020 Total Time Calculation: 55 minutes   Short Term Goals: Week 1: SLP Short Term Goal 1 (Week 1): Patient will participate in further cognitive-linguistic evaluation SLP Short Term Goal 1 - Progress (Week 1): Met  SLP Short Term Goal 2 (Week 1): Patient will produce functional multisyllabic words with implementation of speech strategies (slow rate, seperate syllables) during 75% of occasions with min-to-mod A clinicial cueing/modeling. - 90% of occasions given Min A, though at times completed with Mod I.  SLP Short Term Goal 3 (Week 1): Patient will use appropriate articulatory accuracy and speech rate when verbally reading/speaking with 50% accuracy given mod verbal cues/modeling. - 85% accuracy within the context of conversational speech given overall Sup A.   SLP Short Term Goal 4 (Week 1): Pt will complete functional complex problem solving tasks in the areas of medication, money and time management mod I. - Did not formally address this date.   SLP Short Term Goal 5 (Week 1): Pt will demonstrate selective attention in a mildly-distracting enviornment for 30 minutes mod I. - 30 minutes with Mod I.  SLP Short Term Goal 6 (Week 1): Pt will participate in higher level word finding tasks and be able to engage in abstract conversation with extra time and Supervision A semantic cues. - Pt engaged in abstract conversation for 10 minutes with Mod I for word finding and motor planning for production of multisyllabic words.  Skilled Therapeutic Interventions: Pt seen this date for skilled ST interventions targeting communication and cognitive-linguistic goals outlined above. Pt received awake/alert and OOB in w/c. Pleasant and cooperative throughout. Wife present at onset of session; however, left once SLP arrived.  Agreeable to ST intervention.   Please see above for objective data re: pt's performance on targeted goals. Pt educated on word finding strategies and self-monitoring which pt was noted to utilize and correct with Mod I for verbal errors; Min to Mod A for ID of errors in writing at the word level. Benefited from verbalizing each letter in target words prior to and during writing, in addition to mass practice (modified CART protocol).   Pt left in w/c with all safety measures activated. Call bell reviewed and within reach and all immediate needs met. Continue per current ST POC.  Pain Endorses chronic pain in back.  Therapy/Group: Individual Therapy  [name] [name] 06/07/2021, 7:30 AM

## 2021-06-07 NOTE — Patient Care Conference (Signed)
Inpatient RehabilitationTeam Conference and Plan of Care Update Date: 06/07/2021   Time: 10:50 AM    Patient Name: [name] [name]      Medical Record Number: [id number]  Date of Birth: [birth year] Sex: Male         Room/Bed: 4W11C/4W11C-01 Payor Info: Payor: [place] / Plan: BC[employer] MEDICARE / Product Type: *No Product type* /    Admit Date/Time:  05/31/2021  3:31 PM  Primary Diagnosis:  Left middle cerebral artery stroke [facility])  Hospital Problems: Principal Problem:   Left middle cerebral artery stroke [facility])    Expected Discharge Date: Expected Discharge Date: 06/14/21  Team Members Present: Physician leading conference: Dr. Alysia Penna Social Worker Present: Erlene Quan, BSW Nurse Present: Dorien Chihuahua, RN PT Present: Page Spiro, PT OT Present: Willeen Cass, OT;Mary Jabier Gauss, COTA SLP Present: Charolett Bumpers, SLP PPS Coordinator present : Ileana Ladd, PT     Current Status/Progress Goal Weekly Team Focus  Bowel/Bladder   Pt is continent of bowel/bladder  Pt will remain continent of bowel/bladder  Will assess qshift and PRN   Swallow/Nutrition/ Hydration             ADL's   MINA- MOD A for sit>stands and stand pivots with RW, set- up for UB dressing, MOD A for LB dressing, continues to be limited bp LBP and decreased activity tolerance. no sensation in R hand but compensates well with visual strategies and + time, working on [facility] and motor planning. wife working on getting more assist from aid at home  supervision- MIN A ( maybe downgrade to CGA for standing)  BADL reeducation, ADL transfers, AE training, [facility], DME training, family ed   Mobility   supervision supine>sit and min/mod assist sit>supine, CGA/min assist partial stand/squat pivot transfers relying on armrest support, min assist stand pivot using RW, min assist gait in // bars and +2 w/c follow if ambulating using RW up to ~46f, supervision manual wheelchair mobility up to 157f  supervision/CGA overall primarily at wheelchair level  activity tolerance, bed mobility training, transfer training, gait training using LRAD, pt/family education, DME training, wheelchair mobility   Communication   Min-Supervision A  supervision A - Mod I  higher level word finding, spelling and speech intelligibility   Safety/Cognition/ Behavioral Observations  Supervision A  supervision A- Mod I  complex problem solving, recall and higher level attention   Pain   Pt is currently pain free  Pt will remain pain free  Will assess qshift and PRN   Skin   Pt has stagable wounds  Wounds will heal  Will assess qshift and PRN     Discharge Planning:  Patient discharging home with spouse and family to assist at home. [place] hours if needed.   Team Discussion: Patient required foley for retention/hematuria; irrigation, removal for voiding trial. Reports back pain post fall PTA with decreased sensation on right side.  Patient on target to meet rehab goals: yes, currently needs CGA - min assist for squat pivot transfers and stand pivots with min assist. Not able to ambulate at present with current walker. Needs min assist - supervision for communication. Working on speech intelligibility, problem solving, error awareness and recall. Goals for dicharge from SLP set for supervision - mod I and overall goals set for supervision - min assist.   *See Care Plan and progress notes for long and short-term goals.   Revisions to Treatment  Plan:  Downgraded to CGA goals for OT  Teaching Needs: Safety, transfers, toileting, skin care, medication management, etc  Current Barriers to Discharge: Decreased caregiver support and Home enviroment access/layout  Possible Resolutions to Barriers: Family education HH follow up services including a nurse for wound care No DME; has equipment     Medical Summary Current Status: Chronic low back pain , CHF  compensated, hypoK+ s/p supplementation , urinary retention with foley  Barriers to Discharge: Medical stability;Wound care   Possible Resolutions to Barriers/Weekly Focus: voiding trial , monitor K+ determine need for ongoing supplementation   Continued Need for Acute Rehabilitation Level of Care: The patient requires daily medical management by a physician with specialized training in physical medicine and rehabilitation for the following reasons: Direction of a multidisciplinary physical rehabilitation program to maximize functional independence : Yes Medical management of patient stability for increased activity during participation in an intensive rehabilitation regime.: Yes Analysis of laboratory values and/or radiology reports with any subsequent need for medication adjustment and/or medical intervention. : Yes   I attest that I was present, lead the team conference, and concur with the assessment and plan of the team.   Dorien Chihuahua B 06/07/2021, 4:31 PM

## 2021-06-07 NOTE — Plan of Care (Signed)
  Problem: RH Balance Goal: LTG Patient will maintain dynamic standing with ADLs (OT) Description: LTG:  Patient will maintain dynamic standing balance with assist during activities of daily living (OT)  Flowsheets (Taken 06/07/2021 1053) LTG: Pt will maintain dynamic standing balance during ADLs with: (downgraded JLS) Contact Guard/Touching assist Note: downgraded JLS   Problem: Sit to Stand Goal: LTG:  Patient will perform sit to stand in prep for activites of daily living with assistance level (OT) Description: LTG:  Patient will perform sit to stand in prep for activites of daily living with assistance level (OT) Flowsheets (Taken 06/07/2021 1053) LTG: PT will perform sit to stand in prep for activites of daily living with assistance level: (downgraded JLS) Contact Guard/Touching assist Note: downgraded JLS   Problem: RH Toileting Goal: LTG Patient will perform toileting task (3/3 steps) with assistance level (OT) Description: LTG: Patient will perform toileting task (3/3 steps) with assistance level (OT)  Flowsheets (Taken 06/07/2021 1053) LTG: Pt will perform toileting task (3/3 steps) with assistance level: (downgraded JLS) Contact Guard/Touching assist Note: downgraded JLS

## 2021-06-07 NOTE — Progress Notes (Signed)
PROGRESS NOTE   Subjective/Complaints:  Pt has used electric WC outside the home for last 6 wks Uses manual chair in the home  Chronic LBP but no other c/os this am   Review of Systems  Constitutional:  Negative for fever.  Respiratory:  Negative for shortness of breath.   Cardiovascular:  Negative for chest pain.  Gastrointestinal:  Negative for abdominal pain, constipation, nausea and vomiting.  Genitourinary:  Negative for hematuria.     Objective:   No results found. No results for input(s): WBC, HGB, HCT, PLT in the last 72 hours.  Recent Labs    06/05/21 0640  NA 138  K 4.2  CL 108  CO2 25  GLUCOSE 112*  BUN 29*  CREATININE 1.41*  CALCIUM 8.8*     Intake/Output Summary (Last 24 hours) at 06/07/2021 0853 Last data filed at 06/07/2021 0700 Gross per 24 hour  Intake 836 ml  Output 1300 ml  Net -464 ml      Pressure Injury 05/26/21 Heel Left Stage 3 -  Full thickness tissue loss. Subcutaneous fat may be visible but bone, tendon or muscle are NOT exposed. (Active)  05/26/21 (Initially identified 10/22/20; follow at wound clinic; wife doing dressings)   Location: Heel  Location Orientation: Left  Staging: Stage 3 -  Full thickness tissue loss. Subcutaneous fat may be visible but bone, tendon or muscle are NOT exposed.  Wound Description (Comments):   Present on Admission: Yes     Physical Exam: Vital Signs Blood pressure (!) 107/58, pulse 61, temperature (!) 97.3 F (36.3 C), temperature source Oral, resp. rate 16, height 6' (1.829 m), weight 98.2 kg, SpO2 97 %.  General: No acute distress Mood and affect are appropriate Heart: Regular rate and rhythm no rubs murmurs or extra sounds Lungs: Clear to auscultation, breathing unlabored, no rales or wheezes Abdomen: Positive bowel sounds, soft nontender to palpation, nondistended Extremities: No clubbing, cyanosis, or edema Skin: gr 2 decub, with  scabbing RIght heel Left pre tib surgical excision with granulating tissue , ~2cm diameter no drainage , Let ant ankle venous stasis ulcer granulating well    Stasis dermatitis BLE pre tib  Neurologic: Cranial nerves II through XII intact,  Sensory exam reduced sensation left toes    Musculoskeletal: No joint redness or swelling noted Strength 5 out of 5 in bilateral upper extremities 4 out of 5 bilateral hip flexion 5 out of 5 bilateral knee extension Right ankle PF and DF 2 out of 5, left ankle PF 2 out of 5, DF 1 out of 5.  Tone is normal No joint swelling noted   Assessment/Plan: 1. Functional deficits which require 3+ hours per day of interdisciplinary therapy in a comprehensive inpatient rehab setting. Physiatrist is providing close team supervision and 24 hour management of active medical problems listed below. Physiatrist and rehab team continue to assess barriers to discharge/monitor patient progress toward functional and medical goals  Care Tool:  Bathing    Body parts bathed by patient: Right arm, Chest, Left arm, Face         Bathing assist Assist Level: Minimal Assistance - Patient > 75%     Upper  Body Dressing/Undressing Upper body dressing   What is the patient wearing?: Pull over shirt    Upper body assist Assist Level: Set up assist    Lower Body Dressing/Undressing Lower body dressing      What is the patient wearing?: Pants     Lower body assist Assist for lower body dressing: Moderate Assistance - Patient 50 - 74%     Toileting Toileting Toileting Activity did not occur Landscape architect and hygiene only): N/A (no void or bm)  Toileting assist Assist for toileting: Maximal Assistance - Patient 25 - 49%     Transfers Chair/bed transfer  Transfers assist     Chair/bed transfer assist level: Minimal Assistance - Patient > 75% (partial squat pivot)     Locomotion Ambulation   Ambulation assist   Ambulation activity did not  occur: Safety/medical concerns  Assist level: 2 helpers Assistive device: Parallel bars Max distance: 6-36f   Walk 10 feet activity   Assist  Walk 10 feet activity did not occur: Safety/medical concerns        Walk 50 feet activity   Assist Walk 50 feet with 2 turns activity did not occur: Safety/medical concerns         Walk 150 feet activity   Assist Walk 150 feet activity did not occur: Safety/medical concerns         Walk 10 feet on uneven surface  activity   Assist Walk 10 feet on uneven surfaces activity did not occur: Safety/medical concerns         Wheelchair     Assist Is the patient using a wheelchair?: Yes Type of Wheelchair: Manual    Wheelchair assist level: Independent Max wheelchair distance: 150'    Wheelchair 50 feet with 2 turns activity    Assist        Assist Level: Independent   Wheelchair 150 feet activity     Assist      Assist Level: Independent   Blood pressure (!) 107/58, pulse 61, temperature (!) 97.3 F (36.3 C), temperature source Oral, resp. rate 16, height 6' (1.829 m), weight 98.2 kg, SpO2 97 %.   Medical Problem List and Plan: 1. Functional deficits secondary to left MCA embolic CVA             -patient may shower             -ELOS/Goals: 16-18 days PT/OT Min A wheelchair level; SLP Mod I  -Continue PT, OT and SLP Team conference today please see physician documentation under team conference tab, met with team  to discuss problems,progress, and goals. Formulized individual treatment plan based on medical history, underlying problem and comorbidities.  2.  Antithrombotics: -DVT/anticoagulation:  Pharmaceutical: Other (comment) Eliquis now, previously on xarelto             -antiplatelet therapy: N/A 3. Pain: continue Lidoderm patch, Neurontin 200 mg twice daily and 100 mg nightly, tramadol as needed 4. Mood: Namenda 10 mg twice daily, Razadyne 8 mg daily             -antipsychotic agents:  N/A 5. Neuropsych: This patient is capable of making decisions on his own behalf. 6. Chronic lower extremity wounds. LLE wound from Mohs procedure. Follow-up wound care nurse. Will examine today. Consulted wound care nurse to perform dressing change today given worsening pain Appreciate ortho tech,  unna boot replacement Vaseline gauze , 2x2, all covered by Unna boot change weekly  7. Fluids/Electrolytes/Nutrition: Routine in and outs with  follow-up chemistries 8.  Atrial fibrillation.  Amiodarone 200 mg daily.  Continue Eliquis.  Cardiac rate controlled 9.  CAD/CABG 2014.  No chest pain or shortness of breath.Continue Imdur 60 mg daily -Denies chest pain 10.  History of severe aortic stenosis status post TAVR procedure 2021.  Follow-up cardiology services 11.  History of PMR.  Currently maintained on ARAVA 10 mg daily as well as chronic prednisone 3 mg daily 12.  BPH.  Proscar 5 mg daily.  Check PVR, Has been using external catheter 13.  Hyperlipidemia.  Lipitor/Zetia 14.  Chronic systolic congestive heart failure.  Demadex 40 mg daily. Imdur 54m daily. Monitor for signs of fluid overload 15.  OSA.  CPAP 16.  GERD.  Protonix 17.  PAD with lymphedema status post balloon angioplasty of posterior tibial artery October 2022.  Follow-up outpatient 18.  Chronic anemia.  Followed by hematology services.  He had been receiving Epogen injections as an outpatient.             -6/1 Hgb 9.1  -6/2 Hgb stable  at 10    Latest Ref Rng & Units 06/03/2021    6:36 AM 06/02/2021    5:51 AM 06/01/2021    5:29 AM  CBC  WBC 4.0 - 10.5 K/uL  5.5   6.5    Hemoglobin 13.0 - 17.0 g/dL 9.1   10.0   9.1    Hematocrit 39.0 - 52.0 % 29.0   31.9   28.9    Platelets 150 - 400 K/uL  165   160      19. Dementia, continue galantamine and memantine 20. CKD IIIB, avoid nephrotoxic medications Lab Results  Component Value Date   CREATININE 1.41 (H) 06/05/2021   CREATININE 1.55 (H) 06/03/2021   CREATININE 1.74 (H)  06/02/2021   21. HTN continue imdur Vitals:   06/06/21 2227 06/07/21 0431  BP:  (!) 107/58  Pulse: 61 61  Resp: 18 16  Temp:  (!) 97.3 F (36.3 C)  SpO2: 96% 97%   Controlled 6/7 22. Hematuria, likely related to anticoagulation  -Called Urology, will start Foley with irrigations  23. Hypokalemia:improved with supplementation     Latest Ref Rng & Units 06/05/2021    6:40 AM 06/03/2021    6:36 AM 06/02/2021    5:51 AM  BMP  Glucose 70 - 99 mg/dL 112   115   112    BUN 8 - 23 mg/dL 29   33   36    Creatinine 0.61 - 1.24 mg/dL 1.41   1.55   1.74    Sodium 135 - 145 mmol/L 138   140   139    Potassium 3.5 - 5.1 mmol/L 4.2   3.3   3.5    Chloride 98 - 111 mmol/L 108   106   106    CO2 22 - 32 mmol/L _0 Calcium 8.9 - 10.3 mg/dL 8.8   9.2   9.6      24. Constipation: add miralax daily prn.    LOS: 7 days A FACE TO FACE EVALUATION WAS PERFORMED  ACharlett Blake6/07/2021, 8:53 AM

## 2021-06-07 NOTE — Progress Notes (Signed)
Physical Therapy Session Note  Patient Details  Name: [name] [name] MRN: [id number] Date of Birth: October 13, 1926  Today's Date: 06/07/2021 PT Individual Time: 7616-0737 PT Individual Time Calculation (min): 70 min   Short Term Goals: Week 1:  PT Short Term Goal 1 (Week 1): Pt will consistently perform supine<>sit with min assist PT Short Term Goal 2 (Week 1): Pt will perform sit<>stands using LRAD with CGA consistently PT Short Term Goal 3 (Week 1): Pt will perform bed<>chair transfers using LRAD with CGA consistently PT Short Term Goal 4 (Week 1): Pt will ambulate at least 518f using LRAD with min assist of 1 and +2 w/c follow if needed  Skilled Therapeutic Interventions/Progress Updates:    Pt received sitting in w/c and agreeable to therapy session.  B UE w/c propulsion ~485ftowards therapy gym with pt's R hand repeatedly slipping off w/c rim - donned a glove to improve grip traction with minimal improvement; therefore, transported remainder of distance to therapy gym. Donned theraband around R wheelchair rim to improve traction/grip during propulsion.  Sit>stand in // bars with CGA/light min assist while rising to stand - pt relies heavily on B UE support and is slow to achieve trunk/hip extension for upright posture.   Gait training in // bars but using RW forward/backwards ~18f27fith CGA/light min assist for balance - able to achieve reciprocal stepping forward though continues to require high steppage gait pattern due to bilateral foot drop. Transitioned outside of the // bars.  Gait training [id number] 43fft w57fmin assist of 1 and +2 close w/c follow for safety because pt sits quickly once his low back pain becomes too great for him to continue ambulating - continues to have high steppage gait to clear toes due to bilateral foot drop (L more impaired than R) - forward trunk flexion (pt has increased low back pain with increased trunk extension and upright posture) - able to achieve  reciprocal stepping pattern - demos significant compensation with heavy WBing down through B UEs on AD and limited WBing in his LEs.   Pt reports too fatigued and increasing low back pain preventing him from participating in additional gait training at this time.   B UE w/c propulsion ~150ft to14froom with supervision and much improvement in R hand grip on w/c rim with theraband wrap - requires 1x brief seated rest break.   B LE strengthening from w/c via reciprocal movement patterns on Kinetron pedals against 55cm/sec resistance for 5 minutes total and pt maintaining steady speed of movement throughout reporting feeling his quad muscles activating.  RN in/out for pain medication administration.   B UE w/c propulsion ~100ft bac86f room with supervision. Pt needing to return to bed to have foley removed. Pt set-up w/c for L partial stand/squat pivot transfer to EOB with min verbal cuing for proximity of w/c to the bed - CGA for steadying while turning and pt continuing to rely heavily on B UE support on armrest and bedrail for balance.  Sit>supine with mod assist for B LE management onto bed. Pt left supine in bed in the care of the nurse.     Therapy Documentation Precautions:  Precautions Precautions: Fall, Other (comment) Precaution Comments: R hemiparesis, chronic LBP Restrictions Weight Bearing Restrictions: No  Pain:  Reports increasing low back pain from ambulating - provided seated rest breaks and distraction for pain management - nurse notified and present for medication administration during session.    Therapy/Group:  Individual Therapy  Tawana Scale , PT, DPT, NCS, CSRS 06/07/2021, 12:23 PM

## 2021-06-07 NOTE — Progress Notes (Signed)
Occupational Therapy Session Note  Patient Details  Name: [name] [name] MRN: [id number] Date of Birth: Jul 02, 1926  Today's Date: 06/07/2021 OT Individual Time: 0803-0920 session 1  OT Individual Time Calculation (min): 77 min  Session 2: 1122-1200  Short Term Goals: Week 1:  OT Short Term Goal 1 (Week 1): Pt will don shirt with S. OT Short Term Goal 2 (Week 1): Pt will complete toilet transfer with min A. OT Short Term Goal 3 (Week 1): Pt will complete bathing with mod A at sink. OT Short Term Goal 4 (Week 1): Pt will position his RUE when in chair/bed with no more than mod VCs.  Skilled Therapeutic Interventions/Progress Updates:  Session 1: Pt greeted  supine in bed  agreeable to OT intervention. Session focus on BADL reeducation, functional mobility, dynamic standing balance and decreasing overall caregiver burden.     Pt completed supine>sit with + time with CGA with use of bed features. MIN A for sit>stand from elevated EOB and MIN A to pivot to w/c with RW.  Pt completed w/c mobility to sink with supervision.  Bathing completed from sink level with overall MIN A When completing seated grooming tasks pt noted to have increased difficulty holding on to brush with L hand, education provided on compensatory methods for combing hair.  Pt donned shirt with set- up assist then reports need to void bowels, pt completed w/c propulsion into bathroom with MIN A to bump w/c over bathroom threshold, MIN A for stand pivot to toilet with use of grab bars. + BM with pt needing MIN A for toileting tasks d/t cleanliness and clothing mgmt on R side.  Pt transported to ADL apt with wife present with focus on furniture transfers. Pt completed stand pivot from w/c to recliner with Rw and overall MOD A as pt needed + assist to stand from low recliner as pt does have lift recliner at home. Pt transported back to room with total A where pt left seated in w/c with all needs within reach.   Session 2:   pt seen for  OT intervention for extra minutes. Pt greeted seated in w/c, agreeable to OT intervention. Session focus on bed mobility in ADL apt. Pt has rail already on bed at home, pt able to complete squat pivot from w/c<>EOB with overall MIN A. Pt prefers to elevate BLEs to seat of w/c and then lift legs onto mattress. Attempted bed mobility with use of gait belt acting as leg lifter with pt having difficulty managing leg lifter with R hand. Pt likely to use w/c as stool when completing bed mobility at home as that is what pt was doing before. Pt transported back to room with total A where pt left up in w/c with alarm belt activated and all needs within reach.   Therapy Documentation Precautions:  Precautions Precautions: Fall, Other (comment) Precaution Comments: R hemiparesis, chronic LBP Restrictions Weight Bearing Restrictions: No  Pain:unrated back pain reported, rest breaks, deep breathing and massage utilized as pain mgmt strategies.  Session 2: unrated back pain reported, rest breaks provided as needed.    Therapy/Group: Individual Therapy  Corinne Ports [facility] 06/07/2021, 12:03 PM

## 2021-06-08 ENCOUNTER — Inpatient Hospital Stay (HOSPITAL_COMMUNITY)
Admission: RE | Admit: 2021-06-08 | Discharge: 2021-06-08 | Disposition: A | Discharge status: Home / Self Care | Payer: Medicare Other | Source: Ambulatory Visit | Attending: Internal Medicine | Admitting: Internal Medicine

## 2021-06-08 ENCOUNTER — Inpatient Hospital Stay (HOSPITAL_COMMUNITY): Payer: Medicare Other

## 2021-06-08 LAB — GLUCOSE, CAPILLARY: Glucose-Capillary: 97 mg/dL (ref 70–99)

## 2021-06-08 MED ORDER — TORSEMIDE 20 MG PO TABS
20.0000 mg | ORAL_TABLET | Freq: Every day | ORAL | Status: DC
  Administered 2021-06-09 – 2021-06-12 (×4): 20 mg via ORAL
  Filled 2021-06-08 (×4): qty 1

## 2021-06-08 NOTE — Plan of Care (Signed)
  Problem: RH Ambulation Goal: LTG Patient will ambulate in home environment (PT) Description: LTG: Patient will ambulate in home environment, # of feet with assistance (PT). Outcome: Not Met (add Reason) Flowsheets (Taken 06/08/2021 2320) LTG: Pt will ambulate in home environ  assist needed:: (discontinued goal as pt will not be a safe functional household ambulator upon D/C) -- Note: discontinued goal as pt will not be a safe functional household ambulator upon D/C   Problem: Sit to Stand Goal: LTG:  Patient will perform sit to stand with assistance level (PT) Description: LTG:  Patient will perform sit to stand with assistance level (PT) Flowsheets (Taken 06/08/2021 2320) LTG: PT will perform sit to stand in preparation for functional mobility with assistance level: (downgraded based on CLOF and ELOS) Contact Guard/Touching assist Note: downgraded based on CLOF and ELOS

## 2021-06-08 NOTE — Progress Notes (Signed)
Patient ID: [name] [name], male   DOB: 1926/10/05, 88 y.o.   MRN: [id number]  Team Conference Report to Patient/Family  Team Conference discussion was reviewed with the patient and caregiver, including goals, any changes in plan of care and target discharge date.  Patient and caregiver express understanding and are in agreement.  The patient has a target discharge date of 06/14/21.  SW spoke with patient spouse and provided team conference updates.  Spouse arranged family education for Monday 1-4 PM. Patient reports he is still having back pain so spouse will arrange for the patient to receive his injection at discharge. Spouse plans to increase patients in home care hours to 5x a week and spouse, family and friends will provide supervision in the evenings and on weekends. Spouse concerned about an upcoming trip to the beach, does not plan to take patient but concerned about caregiver services while she will be out of town. Sw will provide the spouse with information on respite care and provide resources for in home care agencies.   Dyanne Iha 06/08/2021, 11:35 AM

## 2021-06-08 NOTE — Progress Notes (Signed)
Occupational Therapy Weekly Progress Note  Patient Details  Name: [name] [name] MRN: [id number] Date of Birth: [birth year]  Beginning of progress report period: June 01, 2021 End of progress report period: June 08, 2021  Today's Date: 06/08/2021 OT Individual Time: 9983-3825 OT Individual Time Calculation (min): 63 min    Patient has met 4 of 4 short term goals. Pt currently requires CGA for squat pivot transfers, MIN A for stand pivot transfers with RW, MIN A for bathing from sink level, set- up for UB dressing, MODA for LB dressing, and MIN A for toileting tasks. Pt continues to present with chronic back pain impacting pts activity tolerance and impaired RUE FMC/motor planning d/t absent sensation in R hand impacting pts ability to complete ADLs independently. Pts goals downgraded to [facility]- supervision assist. Wife has been present during sessions with plan for scheduled family education with wife next week prior to DC home on 6/15. Continue with POC.   Patient continues to demonstrate the following deficits: muscle weakness, decreased cardiorespiratoy endurance, impaired timing and sequencing, abnormal tone, unbalanced muscle activation, and decreased coordination, and decreased standing balance, decreased postural control, hemiplegia, and decreased balance strategies and therefore will continue to benefit from skilled OT intervention to enhance overall performance with BADL and Reduce care partner burden.  Patient progressing toward long term goals..  Continue plan of care. Some LTG were adjusted.   OT Short Term Goals Week 1:  OT Short Term Goal 1 (Week 1): Pt will don shirt with S. OT Short Term Goal 1 - Progress (Week 1): Met OT Short Term Goal 2 (Week 1): Pt will complete toilet transfer with min A. OT Short Term Goal 2 - Progress (Week 1): Met OT Short Term Goal 3 (Week 1): Pt will complete bathing with mod A at sink. OT Short Term Goal 3 - Progress (Week 1): Met OT Short Term Goal 4 (Week  1): Pt will position his RUE when in chair/bed with no more than mod VCs. OT Short Term Goal 4 - Progress (Week 1): Met Week 2:  OT Short Term Goal 1 (Week 2): STG- LTGs d/t LOS  Skilled Therapeutic Interventions/Progress Updates:  Pt greeted seated in w/c   agreeable to OT intervention. Session focus on BADL reeducation, functional mobility, dynamic standing balance and decreasing overall caregiver burden.      Pt requested to shower this session,OTA covered BLEs d/t wounds with pt able to complete squat pivot into walkin shower with grab bars and MINA. Pt completed bathing sitting on shower seat with overall S. Pt utilized lateral leans to complete posterior periarea bathing. Pt exited shower with MIN A via squat pivot with grab bars.  Pt completed dressing from w/c with set- up for UB dressing and MOD A for LB dressing, education provided today on using reacher to don pants for pain mgmt and for energy conservation, pt able to use reacher to don brief but pt needed + time and noted to use more effort as pt very fatigue post activity. Pt needed MOD A to then don pants d/t fatigue. Pt completed sit>stands during session with MIN A - CGA. Pt reports not liking the RW and plans to use something more "sturdy" at home.  Pt left seated in w/c with alarm belt activated and all needs within reach.  Therapy Documentation Precautions:  Precautions Precautions: Fall, Other (comment) Precaution Comments: R hemiparesis, chronic LBP Restrictions Weight Bearing Restrictions: No  Pain: unrated pain reported in back, rest breaks provided as needed.     Therapy/Group: Individual Therapy  [name] 06/08/2021, 12:12 PM

## 2021-06-08 NOTE — Progress Notes (Signed)
Orthopedic Tech Progress Note Patient Details:  [name] [name] [birth year] [id number]   Ortho Devices Type of Ortho Device: Louretta Parma boot Ortho Device/Splint Location: LLE Ortho Device/Splint Interventions: Ordered, Application   Post Interventions Patient Tolerated: Well Instructions Provided: Care of device  Anh Mangano Jeri Modena 06/08/2021, 8:15 PM

## 2021-06-08 NOTE — Progress Notes (Signed)
Occupational Therapy Session Note  Patient Details  Name: DEMON VOLANTE MRN: [id number] Date of Birth: [birth year]  Today's Date: 06/08/2021 OT Group Time: 1430-1530 OT Group Time Calculation (min): 60 min   Short Term Goals: Week 2:  OT Short Term Goal 1 (Week 2): STG- LTGs d/t LOS  Skilled Therapeutic Interventions/Progress Updates: Pt participated in group session with a focus on stress mgmt, education on healthy coping strategies, and social interaction. Focus of session on providing coping strategies to manage new diagnosis to allow for improved mental health to increase overall quality of life . Discussed how to break down stressors into "daily hassles," "major life stressors" and "life circumstances" in an effort to allow pts to chunk their stressors into groups and determine where to best put their efforts/time when dealing with stress. Pt actively sharing stressors and contributing to group conversation. Provided active listening, emotional support and therapeutic use of self. Offered education on factors that protect [place] against stress such as "daily uplifts," "healthy coping strategies" and "protective factors." Encouraged all group members to make an effort to actively recall one event from their day that was a daily uplift in an effort to protect their mindset from stressors as well as sharing this information with their caregivers to facilitate improved caregiver communication and decrease overall burden of care.  Issued pt handouts on healthy coping strategies to implement into routine. Pt transported back to room by this OTA where pt left up in wc with alarm belt activated and all needs within reach.   Therapy Documentation Precautions:  Precautions Precautions: Fall, Other (comment) Precaution Comments: R hemiparesis, chronic LBP Restrictions Weight Bearing Restrictions: No    Pain: no pain     Therapy/Group: Group Therapy  [name] 06/08/2021, 4:03 PM

## 2021-06-08 NOTE — Progress Notes (Signed)
Physical Therapy Weekly Progress Note  Patient Details  Name: [name] [name] MRN: [id number] Date of Birth: 10-21-1926  Beginning of progress report period: June 01, 2021 End of progress report period: June 08, 2021  Today's Date: 06/08/2021 PT Individual Time: 0805-0902 PT Individual Time Calculation (min): 57 min   Patient has met 1 of 4 short term goals. [name] is making slow, but steady progress with therapy despite limited STGs being met. He continues to be limited primarily by increased low back pain that worsens with standing activities. He is performing supine>sitting with supervision, sit>supine using LE support on w/c with min assist, partial stand/squat pivot transfers using UE support on w/c armrests with CGA/min assist, and stand pivot transfers using RW with CGA/min assist. He is progressing with gait training up to 64f using RW with min assist of 1 and close +2 w/c follow due to increase in low back pain that requires pt to sit quickly - discussed impact of pt's foot drop on gait mechanics and quality as well as anticipation that pt will return to using wheelchair for functional mobility upon D/C to allow increased independence and safety at home. Pt will benefit from continued CIR level therapies prior to D/Cing home with 24hr support.  Patient continues to demonstrate the following deficits muscle weakness, muscle joint tightness, and muscle paralysis, decreased cardiorespiratoy endurance, unbalanced muscle activation, and decreased standing balance, decreased postural control, and decreased balance strategies and therefore will continue to benefit from skilled PT intervention to increase functional independence with mobility.  Patient not progressing toward long term goals.  See goal revision..  Continue plan of care.  PT Short Term Goals Week 1:  PT Short Term Goal 1 (Week 1): Pt will consistently perform supine<>sit with min assist PT Short Term Goal 1 - Progress (Week 1):  Progressing toward goal PT Short Term Goal 2 (Week 1): Pt will perform sit<>stands using LRAD with CGA consistently PT Short Term Goal 2 - Progress (Week 1): Progressing toward goal PT Short Term Goal 3 (Week 1): Pt will perform bed<>chair transfers using LRAD with CGA consistently PT Short Term Goal 3 - Progress (Week 1): Progressing toward goal PT Short Term Goal 4 (Week 1): Pt will ambulate at least 158fusing LRAD with min assist of 1 and +2 w/c follow if needed PT Short Term Goal 4 - Progress (Week 1): Met Week 2:  PT Short Term Goal 1 (Week 2): = to LTGs based on ELOS  Skilled Therapeutic Interventions/Progress Updates:  Ambulation/gait training;Cognitive remediation/compensation;Discharge planning;DME/adaptive equipment instruction;Functional mobility training;Pain management;Psychosocial support;Splinting/orthotics;Therapeutic Activities;UE/LE Strength taining/ROM;Visual/perceptual remediation/compensation;Balance/vestibular training;Community reintegration;Disease management/prevention;Functional electrical stimulation;Neuromuscular re-education;Patient/family education;Skin care/wound management;Stair training;Therapeutic Exercise;UE/LE Coordination activities;Wheelchair propulsion/positioning   Pt received sitting in w/c at sink and agreeable to therapy session. Sitting in w/c donned shirt and threaded on pants with set-up assist. Sit>stand w/c>RW with CGA for steadying - continues to be very slow to rise with delayed trunk/hip extension, relying heavily on B UE support. Standing with CGA using RW support while pulling pants up over hips with min assist for efficiency. Donned shoes total assist - noticed L Unna boot is wet - notified nurse. Discussed recommendation for pt to bring lace up tennis shoes to allow ability to trial external AFO (do not recommend a brace that goes inside of his shoes due to hx of multiple skin wounds and impaired sensation) to utilize during gait training such as  foot-up brace. Discussed asking pt's wife to bring in his Nitro rollator to  trial because pt reports he doesn't feel safe using RW.  BUE w/c propulsion ~120f towards ortho gym with supervision - continues to have improved grip on R wheel rim since theraband donned to it.   Simulated car transfer training ([employer] Forester height) via stand pivot from w/c using UE support on w/c armrest and car pieces with min assist for lifting LEs in/out vehicle and rotating hips while exiting the vehicle - maintained forward flexed posture throughout the transfer.   Discussed D/C planning with recommendation for follow-up HHPT. Pt reports feeling comfortable performing mobility tasks with his current DME - pt already has manual wheelchair, power wheelchair, and walker.  Transported back to his room in w/c.   MD in/out for morning assessment - made aware of pt's increased low back pain.  Pt left seated in w/c with needs in reach and NT present to assume care of patient.   Therapy Documentation Precautions:  Precautions Precautions: Fall, Other (comment) Precaution Comments: R hemiparesis, chronic LBP Restrictions Weight Bearing Restrictions: No   Pain:  Pt reporting increased low back pain this morning - nurse notified and present for medication administration - provided seated rest breaks and repositioning for pain management during session.   Therapy/Group: Individual Therapy  CTawana Scale, PT, DPT, NCS, CSRS 06/08/2021, 7:51 AM

## 2021-06-08 NOTE — Progress Notes (Signed)
PROGRESS NOTE   Subjective/Complaints:   Per PT, had worsening ambulation following a fall a couple months ago, did not seek medical attention but did purchase an electric WC   Review of Systems  Constitutional:  Negative for fever.  Respiratory:  Negative for shortness of breath.   Cardiovascular:  Negative for chest pain.  Gastrointestinal:  Negative for abdominal pain, constipation, nausea and vomiting.  Genitourinary:  Negative for hematuria.      Objective:   No results found. No results for input(s): "WBC", "HGB", "HCT", "PLT" in the last 72 hours.  Recent Labs    06/07/21 1026  NA 136  K 4.1  CL 103  CO2 24  GLUCOSE 105*  BUN 31*  CREATININE 1.71*  CALCIUM 9.0     Intake/Output Summary (Last 24 hours) at 06/08/2021 0845 Last data filed at 06/08/2021 0700 Gross per 24 hour  Intake 524 ml  Output --  Net 524 ml      Pressure Injury 05/26/21 Heel Left Stage 3 -  Full thickness tissue loss. Subcutaneous fat may be visible but bone, tendon or muscle are NOT exposed. (Active)  05/26/21 (Initially identified 10/22/20; follow at wound clinic; wife doing dressings)   Location: Heel  Location Orientation: Left  Staging: Stage 3 -  Full thickness tissue loss. Subcutaneous fat may be visible but bone, tendon or muscle are NOT exposed.  Wound Description (Comments):   Present on Admission: Yes     Physical Exam: Vital Signs Blood pressure 124/77, pulse 74, temperature 97.8 F (36.6 C), resp. rate 17, height 6' (1.829 m), weight 98.2 kg, SpO2 99 %.  General: No acute distress Mood and affect are appropriate Heart: Regular rate and rhythm no rubs murmurs or extra sounds Lungs: Clear to auscultation, breathing unlabored, no rales or wheezes Abdomen: Positive bowel sounds, soft nontender to palpation, nondistended Extremities: No clubbing, cyanosis, or edema Skin: gr 2 decub, with scabbing RIght heel Left  pre tib surgical excision with granulating tissue , ~2cm diameter no drainage , Let ant ankle venous stasis ulcer granulating well    Stasis dermatitis BLE pre tib  Neurologic: Cranial nerves II through XII intact,  Sensory exam reduced sensation left toes    Musculoskeletal: No joint redness or swelling noted Strength 5 out of 5 in bilateral upper extremities 4 out of 5 bilateral hip flexion 5 out of 5 bilateral knee extension Right ankle PF and DF 2 out of 5, left ankle PF 2 out of 5, DF 1 out of 5.  Tone is normal No joint swelling noted   Assessment/Plan: 1. Functional deficits which require 3+ hours per day of interdisciplinary therapy in a comprehensive inpatient rehab setting. Physiatrist is providing close team supervision and 24 hour management of active medical problems listed below. Physiatrist and rehab team continue to assess barriers to discharge/monitor patient progress toward functional and medical goals  Care Tool:  Bathing    Body parts bathed by patient: Right arm, Chest, Left arm, Face         Bathing assist Assist Level: Minimal Assistance - Patient > 75%     Upper Body Dressing/Undressing Upper body dressing   What  is the patient wearing?: Pull over shirt    Upper body assist Assist Level: Set up assist    Lower Body Dressing/Undressing Lower body dressing      What is the patient wearing?: Pants     Lower body assist Assist for lower body dressing: Moderate Assistance - Patient 50 - 74%     Toileting Toileting Toileting Activity did not occur Landscape architect and hygiene only): N/A (no void or bm)  Toileting assist Assist for toileting: Maximal Assistance - Patient 25 - 49%     Transfers Chair/bed transfer  Transfers assist     Chair/bed transfer assist level: Minimal Assistance - Patient > 75% (partial squat pivot)     Locomotion Ambulation   Ambulation assist   Ambulation activity did not occur: Safety/medical  concerns  Assist level: 2 helpers Assistive device: Parallel bars Max distance: 6-6f   Walk 10 feet activity   Assist  Walk 10 feet activity did not occur: Safety/medical concerns        Walk 50 feet activity   Assist Walk 50 feet with 2 turns activity did not occur: Safety/medical concerns         Walk 150 feet activity   Assist Walk 150 feet activity did not occur: Safety/medical concerns         Walk 10 feet on uneven surface  activity   Assist Walk 10 feet on uneven surfaces activity did not occur: Safety/medical concerns         Wheelchair     Assist Is the patient using a wheelchair?: Yes Type of Wheelchair: Manual    Wheelchair assist level: Independent Max wheelchair distance: 150'    Wheelchair 50 feet with 2 turns activity    Assist        Assist Level: Independent   Wheelchair 150 feet activity     Assist      Assist Level: Independent   Blood pressure 124/77, pulse 74, temperature 97.8 F (36.6 C), resp. rate 17, height 6' (1.829 m), weight 98.2 kg, SpO2 99 %.   Medical Problem List and Plan: 1. Functional deficits secondary to left MCA embolic CVA             -patient may shower             -ELOS/Goals:  06/14/21 PT/OT Min A wheelchair level; SLP Mod I  -Continue PT, OT and SLP   2.  Antithrombotics: -DVT/anticoagulation:  Pharmaceutical: Other (comment) Eliquis now, previously on xarelto             -antiplatelet therapy: N/A 3. Pain: continue Lidoderm patch, Neurontin 200 mg twice daily and 100 mg nightly, tramadol as needed 4. Mood: Namenda 10 mg twice daily, Razadyne 8 mg daily             -antipsychotic agents: N/A 5. Neuropsych: This patient is capable of making decisions on his own behalf. 6. Chronic lower extremity wounds. LLE wound from Mohs procedure. Follow-up wound care nurse. Will examine today. Consulted wound care nurse to perform dressing change today given worsening pain Appreciate ortho  tech,  unna boot replacement Vaseline gauze , 2x2, all covered by Unna boot change weekly  7. Fluids/Electrolytes/Nutrition: Routine in and outs with follow-up chemistries 8.  Atrial fibrillation.  Amiodarone 200 mg daily.  Continue Eliquis.  Cardiac rate controlled 9.  CAD/CABG 2014.  No chest pain or shortness of breath.Continue Imdur 60 mg daily -Denies chest pain 10.  History of severe aortic stenosis  status post TAVR procedure 2021.  Follow-up cardiology services 11.  History of PMR.  Currently maintained on ARAVA 10 mg daily as well as chronic prednisone 3 mg daily 12.  BPH.  Proscar 5 mg daily.  Check PVR, Has been using external catheter 13.  Hyperlipidemia.  Lipitor/Zetia 14.  Chronic systolic congestive heart failure.  Demadex 40 mg daily. Imdur '60mg'$  daily. Monitor for signs of fluid overload 15.  OSA.  CPAP 16.  GERD.  Protonix 17.  PAD with lymphedema status post balloon angioplasty of posterior tibial artery October 2022.  Follow-up outpatient 18.  Chronic anemia.  Followed by hematology services.  He had been receiving Epogen injections as an outpatient.             -6/1 Hgb 9.1  -6/2 Hgb stable  at 10    Latest Ref Rng & Units 06/03/2021    6:36 AM 06/02/2021    5:51 AM 06/01/2021    5:29 AM  CBC  WBC 4.0 - 10.5 K/uL  5.5  6.5   Hemoglobin 13.0 - 17.0 g/dL 9.1  10.0  9.1   Hematocrit 39.0 - 52.0 % 29.0  31.9  28.9   Platelets 150 - 400 K/uL  165  160     19. Dementia, continue galantamine and memantine 20. CKD IIIB, avoid nephrotoxic medications Lab Results  Component Value Date   CREATININE 1.71 (H) 06/07/2021   CREATININE 1.41 (H) 06/05/2021   CREATININE 1.55 (H) 06/03/2021   21. HTN continue imdur Vitals:   06/07/21 2231 06/08/21 0435  BP:  124/77  Pulse: 74 74  Resp: 16 17  Temp:  97.8 F (36.6 C)  SpO2: 96% 99%   Controlled 6/7 22. Hematuria, likely related to anticoagulation  -Called Urology, will start Foley with irrigations  23. Hypokalemia:improved  with supplementation     Latest Ref Rng & Units 06/07/2021   10:26 AM 06/05/2021    6:40 AM 06/03/2021    6:36 AM  BMP  Glucose 70 - 99 mg/dL 105  112  115   BUN 8 - 23 mg/dL 31  29  33   Creatinine 0.61 - 1.24 mg/dL 1.71  1.41  1.55   Sodium 135 - 145 mmol/L 136  138  140   Potassium 3.5 - 5.1 mmol/L 4.1  4.2  3.3   Chloride 98 - 111 mmol/L 103  108  106   CO2 22 - 32 mmol/L '24  25  25   '$ Calcium 8.9 - 10.3 mg/dL 9.0  8.8  9.2    Elevated Creat , likely overdiuresis, no LE swelling will reduce demadex over the weekend recheck on Monday  24. Constipation: add miralax daily prn.  25.  Bilateral foot drop , pt aphasic canot pinpoint onset, hx spine surgery in past , last Lumbar MRI 01/08/20 demonstrated moderate L4-5 stenosis, T spine MRI on 08/17/20 demonstrated T11-12 moderate stenossis but no spinal cord impingement, also old T8 comp fx  Repeat Lumbar xray given subacute fall with hx of comp fx and decline in mobility   LOS: 8 days A FACE TO FACE EVALUATION WAS PERFORMED  Charlett Blake 06/08/2021, 8:45 AM

## 2021-06-08 NOTE — Progress Notes (Signed)
Speech Language Pathology Weekly Progress and Session Note  Patient Details  Name: [name] [name] MRN: [id number] Date of Birth: October 19, 1926  Beginning of progress report period: June 02, 2021 End of progress report period: June 08, 2021  Today's Date: 06/08/2021 SLP Individual Time: 1115-1150 SLP Individual Time Calculation (min): 35 min  Short Term Goals: Week 1: SLP Short Term Goal 1 (Week 1): Patient will participate in further cognitive-linguistic evaluation SLP Short Term Goal 1 - Progress (Week 1): Met SLP Short Term Goal 2 (Week 1): Patient will produce functional multisyllabic words with implementation of speech strategies (slow rate, seperate syllables) during 75% of occasions with min-to-mod A clinicial cueing/modeling SLP Short Term Goal 2 - Progress (Week 1): Met SLP Short Term Goal 3 (Week 1): Patient will use appropriate articulatory accuracy and speech rate when verbally reading/speaking with 50% accuracy given mod verbal cues/modeling. SLP Short Term Goal 3 - Progress (Week 1): Met SLP Short Term Goal 4 (Week 1): Pt will complete functional complex problem solving tasks in the areas of medication, money and time management mod I. SLP Short Term Goal 4 - Progress (Week 1): Met SLP Short Term Goal 5 (Week 1): Pt will demonstrate selective attention in a mildly-distracting enviornment for 30 minutes mod I. SLP Short Term Goal 5 - Progress (Week 1): Met SLP Short Term Goal 6 (Week 1): Pt will participate in higher level word finding tasks and be able to engage in abstract conversation with extra time and Supervision A semantic cues. SLP Short Term Goal 6 - Progress (Week 1): Met    New Short Term Goals: Week 2: SLP Short Term Goal 1 (Week 2): STG=LTG due to short ELOS (6/15)  Weekly Progress Updates: Pt made great progresses meeting 6 out 6 goals. Pt demonstrated improvement in complex problem solving skills completing medication and time management tasks. Pt demonstrates  improvement in self-correction of verbal errors when communicating at the phrase/sentence level. Pt demonstrated improvement from coping to witting via dictation 2 syllable words. Pt's barrier at discharge are higher level word finding, moderate level spelling, motor speech production and alternating attention. Pt would continue to benefit from skilled ST services in order to maximize functional independence and reduce burden of care, with continued skilled ST services at discharge.      Intensity: Minumum of 1-2 x/day, 30 to 90 minutes Frequency: 3 to 5 out of 7 days Duration/Length of Stay: 6/15 Treatment/Interventions: Speech/Language facilitation;Cueing hierarchy;Patient/family education;Functional tasks;Therapeutic Activities   Daily Session  Skilled Therapeutic Interventions: Skilled ST services focused on language skills. SLP facilitated spelling via dictation of 2-3 syllable words. Pt demonstrated 85-90% accuracy spelling 2 syllable words with ability to correct errors with min A verbal cues increasing to 100% accuracy. Pt required min A verbal cues for error awareness in spelling 3 syllable words and require max A verbal cues. Pt expressing witting is further impacted by RUE weakness. SLP provided mutlisyllable handout to continue practicing spelling outside of therapy. Pt missed 10 minutes of treatment due to x-ray. Pt was left in room with transport staff. SLP recommends to continue skilled services.    General    Pain Pain Assessment Pain Scale: 0-10 Pain Score: 4  Pain Type: Chronic pain Pain Location: Back Pain Orientation: Mid;Lower Pain Descriptors / Indicators: Aching Pain Intervention(s): Medication (See eMAR)  Therapy/Group: Individual Therapy  Mardy Lucier  [facility] 06/08/2021, 11:55 AM

## 2021-06-09 NOTE — Progress Notes (Signed)
Patient currently on home CPAP. No assistance needed from RT at this time.

## 2021-06-09 NOTE — Progress Notes (Signed)
Occupational Therapy Session Note  Patient Details  Name: [name] [name] MRN: [id number] Date of Birth: 12/13/1926  Today's Date: 06/09/2021 OT Individual Time: 1135-1203 OT Individual Time Calculation (min): 28 min  17 mins missed d/t nursing care [place]: [id number]   Short Term Goals: Week 2:  OT Short Term Goal 1 (Week 2): STG- LTGs d/t LOS  Skilled Therapeutic Interventions/Progress Updates:  Session 1: Pt received supine in bed with LPN present performing bladder cath, returned 17 mins later with pt  agreeable to OT intervention. Session focus on BADL reeducation, functional mobility, dynamic standing balance and decreasing overall caregiver burden.     Pt completed supine>sit with CGA with use of bed features. Pt completed squat pivot with more of MOD A assist today as pt seemed to be in more pain this AM. Pt completed seated grooming at sink MOD I.  Set- up to don [place] shirt and MIN A to don pants with pt standing at bed rail today to pull pants up to waist line in standing.  Education provided on using talk to text to send text messages, pt able to return demo technique and able to stand text to his wife. Will continue to work on this compensatory method and alerted SLP to increase carryover.          pt left seated in w.c with alarm belt activated and all needs within reach.                     Session 2: Pt greeted seated in w/c agreeable to OT intervention. Pt requests to brush teeth after eating lunch with pt completing task MOD I. Pt completed w/c mobility to ADL apt with supervision. Pt completed squat pivot to EOB from w/c with CGA using bed rail ( pt has bed rail at home). Pt uses w/c to elevate BLEs to bed which pt was doing at home. Pt completes supine<>sit with CGA. Pt also completed walkin shower transfer where pt stands to RW and steps over threshold of shower and turns hips to seat with MINA. Visual aid provided of home set- up with safe situation already implemented and wife  present during session to confirm. Practiced both squat pivot transfers and stand pivot transfers in gym with pt needing overall MIN A- CGA depending on pain.  Did educate wife on using energy wisely at home as pt is often very capable of completing LB ADLs however d/t pts chronic LBP pt may need help to optimize energy for other functional mobility tasks.  Wife also wanting ideas of how to allow pt to continue hobbies at home [name] as wood working and drawing, education provided on compensatory ideas such as table top activities like building bird houses or painting jewelry boxes for granddaughters.         Therapy Documentation Precautions:  Precautions Precautions: Fall, Other (comment) Precaution Comments: R hemiparesis, chronic LBP Restrictions Weight Bearing Restrictions: No   Pain: Session 1: unrated chronic back pain with mobility, rest breaks, repositioning and pain patch utilized for pain mgmt.  Session 2:  unrated chronic back pain with mobility, rest breaks, repositioning and pain patch utilized for pain mgmt.     Therapy/Group: Individual Therapy  [name] 06/09/2021, 12:28 PM

## 2021-06-09 NOTE — Progress Notes (Signed)
Patient reports voided when he has a bowel movement about 1600, however urine not documented. Bladder scan is 51cc. Patient refusing cath at this time. Will continue to monitor.

## 2021-06-09 NOTE — Progress Notes (Signed)
Patient currently on home CPAP. No assistance needed from RT at this time

## 2021-06-09 NOTE — Progress Notes (Signed)
Physical Therapy Session Note  Patient Details  Name: [name] [name] MRN: [id number] Date of Birth: 04/25/1926  Today's Date: 06/09/2021 PT Individual Time: 1620-1730 PT Individual Time Calculation (min): 70 min   Short Term Goals: Week 1:  PT Short Term Goal 1 (Week 1): Pt will consistently perform supine<>sit with min assist PT Short Term Goal 1 - Progress (Week 1): Progressing toward goal PT Short Term Goal 2 (Week 1): Pt will perform sit<>stands using LRAD with CGA consistently PT Short Term Goal 2 - Progress (Week 1): Progressing toward goal PT Short Term Goal 3 (Week 1): Pt will perform bed<>chair transfers using LRAD with CGA consistently PT Short Term Goal 3 - Progress (Week 1): Progressing toward goal PT Short Term Goal 4 (Week 1): Pt will ambulate at least 75f using LRAD with min assist of 1 and +2 w/c follow if needed PT Short Term Goal 4 - Progress (Week 1): Met Week 2:  PT Short Term Goal 1 (Week 2): = to LTGs based on ELOS   Skilled Therapeutic Interventions/Progress Updates:    Pt received sitting in WC and agreeable to PT. Pain meds supplied by RN.   Transfer to power WC.   Powere WC mobility with use of RUE through rehab unit, artrium and over sidewalk.   UBE.   [facility] and all needs met[facility]          Therapy Documentation Precautions:  Precautions Precautions: Fall, Other (comment) Precaution Comments: R hemiparesis, chronic LBP Restrictions Weight Bearing Restrictions: No General:   Vital Signs:  Pain: Pain Assessment Pain Scale: 0-10 Pain Score: 2  Pain Type: Chronic pain Pain Location: Back Pain Orientation: Lower Pain Descriptors / Indicators: Aching Pain Onset: On-going Patients Stated Pain Goal: 0 Pain Intervention(s): Medication (See eMAR) Mobility:   Locomotion :    Trunk/Postural Assessment :    Balance:   Exercises:   Other Treatments:      Therapy/Group:  Individual Therapy  ALorie Phenix6/09/2021, 5:56 PM

## 2021-06-09 NOTE — Progress Notes (Signed)
PROGRESS NOTE   Subjective/Complaints:  Reviewed Lumbar xray , no new comp fx  DIscussed bladder , has required I/O cath x 2 times last noc , no pain with caths no blood noted   Review of Systems  Constitutional:  Negative for fever.  Respiratory:  Negative for shortness of breath.   Cardiovascular:  Negative for chest pain.  Gastrointestinal:  Negative for abdominal pain, constipation, nausea and vomiting.  Genitourinary:  Negative for hematuria.      Objective:   DG Lumbar Spine 2-3 Views  Result Date: 06/08/2021 CLINICAL DATA:  Chronic low back pain, history of kyphoplasty EXAM: LUMBAR SPINE - 2-3 VIEW COMPARISON:  Correlation made with CT from March 2023 FINDINGS: Vertebral body heights are similar. Chronic L1 compression fracture with kyphoplasty. Disc space narrowing is greatest at L5-S1. Aortic calcification. IMPRESSION: Similar vertebral body heights. Electronically Signed   By: Macy Mis M.D.   On: 06/08/2021 12:41   No results for input(s): "WBC", "HGB", "HCT", "PLT" in the last 72 hours.  Recent Labs    06/07/21 1026  NA 136  K 4.1  CL 103  CO2 24  GLUCOSE 105*  BUN 31*  CREATININE 1.71*  CALCIUM 9.0     Intake/Output Summary (Last 24 hours) at 06/09/2021 0743 Last data filed at 06/09/2021 0610 Gross per 24 hour  Intake --  Output 1376 ml  Net -1376 ml      Pressure Injury 05/26/21 Heel Left Stage 3 -  Full thickness tissue loss. Subcutaneous fat may be visible but bone, tendon or muscle are NOT exposed. (Active)  05/26/21 (Initially identified 10/22/20; follow at wound clinic; wife doing dressings)   Location: Heel  Location Orientation: Left  Staging: Stage 3 -  Full thickness tissue loss. Subcutaneous fat may be visible but bone, tendon or muscle are NOT exposed.  Wound Description (Comments):   Present on Admission: Yes     Physical Exam: Vital Signs Blood pressure (!) 103/57, pulse 63,  temperature (!) 97.5 F (36.4 C), temperature source Oral, resp. rate 18, height 6' (1.829 m), weight 98.2 kg, SpO2 99 %.  General: No acute distress Mood and affect are appropriate Heart: Regular rate and rhythm no rubs murmurs or extra sounds Lungs: Clear to auscultation, breathing unlabored, no rales or wheezes Abdomen: Positive bowel sounds, soft nontender to palpation, nondistended Extremities: No clubbing, cyanosis, or edema Skin: gr 2 decub, with scabbing RIght heel Left pre tib surgical excision with granulating tissue , ~2cm diameter no drainage , Let ant ankle venous stasis ulcer granulating well    Stasis dermatitis BLE pre tib  Neurologic: Cranial nerves II through XII intact,  Sensory exam reduced sensation left toes    Musculoskeletal: No joint redness or swelling noted Strength 5 out of 5 in bilateral upper extremities 4 out of 5 bilateral hip flexion 5 out of 5 bilateral knee extension Right ankle PF and DF 2 out of 5, left ankle PF 2 out of 5, DF 1 out of 5.  Tone is normal No joint swelling noted   Assessment/Plan: 1. Functional deficits which require 3+ hours per day of interdisciplinary therapy in a comprehensive inpatient rehab setting.  Physiatrist is providing close team supervision and 24 hour management of active medical problems listed below. Physiatrist and rehab team continue to assess barriers to discharge/monitor patient progress toward functional and medical goals  Care Tool:  Bathing    Body parts bathed by patient: Right arm, Left arm, Chest, Abdomen, Front perineal area, Buttocks, Right upper leg, Left upper leg, Face     Body parts n/a: Right lower leg, Left lower leg   Bathing assist Assist Level: Supervision/Verbal cueing     Upper Body Dressing/Undressing Upper body dressing   What is the patient wearing?: Pull over shirt    Upper body assist Assist Level: Set up assist    Lower Body Dressing/Undressing Lower body dressing       What is the patient wearing?: Pants, Underwear/pull up     Lower body assist Assist for lower body dressing: Moderate Assistance - Patient 50 - 74%     Toileting Toileting Toileting Activity did not occur Landscape architect and hygiene only): N/A (no void or bm)  Toileting assist Assist for toileting: Maximal Assistance - Patient 25 - 49%     Transfers Chair/bed transfer  Transfers assist     Chair/bed transfer assist level: Minimal Assistance - Patient > 75% (partial squat pivot)     Locomotion Ambulation   Ambulation assist   Ambulation activity did not occur: Safety/medical concerns  Assist level: 2 helpers Assistive device: Parallel bars Max distance: 6-6f   Walk 10 feet activity   Assist  Walk 10 feet activity did not occur: Safety/medical concerns        Walk 50 feet activity   Assist Walk 50 feet with 2 turns activity did not occur: Safety/medical concerns         Walk 150 feet activity   Assist Walk 150 feet activity did not occur: Safety/medical concerns         Walk 10 feet on uneven surface  activity   Assist Walk 10 feet on uneven surfaces activity did not occur: Safety/medical concerns         Wheelchair     Assist Is the patient using a wheelchair?: Yes Type of Wheelchair: Manual    Wheelchair assist level: Independent Max wheelchair distance: 150'    Wheelchair 50 feet with 2 turns activity    Assist        Assist Level: Independent   Wheelchair 150 feet activity     Assist      Assist Level: Independent   Blood pressure (!) 103/57, pulse 63, temperature (!) 97.5 F (36.4 C), temperature source Oral, resp. rate 18, height 6' (1.829 m), weight 98.2 kg, SpO2 99 %.   Medical Problem List and Plan: 1. Functional deficits secondary to left MCA embolic CVA             -patient may shower             -ELOS/Goals:  06/14/21 PT/OT Min A wheelchair level; SLP Mod I  -Continue PT, OT and  SLP   2.  Antithrombotics: -DVT/anticoagulation:  Pharmaceutical: Other (comment) Eliquis now, previously on xarelto             -antiplatelet therapy: N/A 3. Pain: continue Lidoderm patch, Neurontin 200 mg twice daily and 100 mg nightly, tramadol as needed 4. Mood: Namenda 10 mg twice daily, Razadyne 8 mg daily             -antipsychotic agents: N/A 5. Neuropsych: This patient is capable of making  decisions on his own behalf. 6. Chronic lower extremity wounds. LLE wound from Mohs procedure. Follow-up wound care nurse. Will examine today. Consulted wound care nurse to perform dressing change today given worsening pain Appreciate ortho tech,  unna boot replacement Vaseline gauze , 2x2, all covered by Unna boot change weekly  7. Fluids/Electrolytes/Nutrition: Routine in and outs with follow-up chemistries 8.  Atrial fibrillation.  Amiodarone 200 mg daily.  Continue Eliquis.  Cardiac rate controlled 9.  CAD/CABG 2014.  No chest pain or shortness of breath.Continue Imdur 60 mg daily -Denies chest pain  10.  History of severe aortic stenosis status post TAVR procedure 2021.  Follow-up cardiology services 11.  History of PMR.  Currently maintained on ARAVA 10 mg daily as well as chronic prednisone 3 mg daily 12.  BPH.  Proscar 5 mg daily.  Check PVR, Has been using external catheter 13.  Hyperlipidemia.  Lipitor/Zetia 14.  Chronic systolic congestive heart failure.  Demadex 40 mg daily. Imdur '60mg'$  daily.  Elevated Creat, reduced demadex to '20mg'$  qd, monitor for fluid overload  15.  OSA.  CPAP 16.  GERD.  Protonix 17.  PAD with lymphedema status post balloon angioplasty of posterior tibial artery October 2022.  Follow-up outpatient 18.  Chronic anemia.  Followed by hematology services.  He had been receiving Epogen injections as an outpatient.          cont to monitor weekly , no hematuria     Latest Ref Rng & Units 06/03/2021    6:36 AM 06/02/2021    5:51 AM 06/01/2021    5:29 AM  CBC  WBC 4.0  - 10.5 K/uL  5.5  6.5   Hemoglobin 13.0 - 17.0 g/dL 9.1  10.0  9.1   Hematocrit 39.0 - 52.0 % 29.0  31.9  28.9   Platelets 150 - 400 K/uL  165  160     19. Dementia, continue galantamine and memantine 20. CKD IIIB, avoid nephrotoxic medications Lab Results  Component Value Date   CREATININE 1.71 (H) 06/07/2021   CREATININE 1.41 (H) 06/05/2021   CREATININE 1.55 (H) 06/03/2021   21. HTN continue imdur Vitals:   06/08/21 2008 06/09/21 0424  BP: 116/73 (!) 103/57  Pulse: 74 63  Resp: 18 18  Temp: 98.1 F (36.7 C) (!) 97.5 F (36.4 C)  SpO2: 100% 99%   Controlled 6/9  22. Urinary retention with cath induced Hematuria, resolved but still at risk due to cont Int cath as well as anticoag with Eliquis , monitor Hgb and clinically , if recurs would place foley   23. Hypokalemia:improved with supplementation     Latest Ref Rng & Units 06/07/2021   10:26 AM 06/05/2021    6:40 AM 06/03/2021    6:36 AM  BMP  Glucose 70 - 99 mg/dL 105  112  115   BUN 8 - 23 mg/dL 31  29  33   Creatinine 0.61 - 1.24 mg/dL 1.71  1.41  1.55   Sodium 135 - 145 mmol/L 136  138  140   Potassium 3.5 - 5.1 mmol/L 4.1  4.2  3.3   Chloride 98 - 111 mmol/L 103  108  106   CO2 22 - 32 mmol/L '24  25  25   '$ Calcium 8.9 - 10.3 mg/dL 9.0  8.8  9.2    Elevated Creat , likely overdiuresis, no LE swelling will reduce demadex over the weekend recheck on Monday  24. Constipation: add miralax daily prn.  25.  Bilateral foot drop , pt aphasic canot pinpoint onset, hx spine surgery in past , last Lumbar MRI 01/08/20 demonstrated moderate L4-5 stenosis, T spine MRI on 08/17/20 demonstrated T11-12 moderate stenossis but no spinal cord impingement, also old T8 comp fx  Repeat Lumbar xray no new findings  LOS: 9 days A FACE TO FACE EVALUATION WAS PERFORMED  Charlett Blake 06/09/2021, 7:43 AM

## 2021-06-09 NOTE — Progress Notes (Signed)
Speech Language Pathology Daily Session Note  Patient Details  Name: [name] [name] MRN: [id number] Date of Birth: [birth year]  Today's Date: 06/09/2021 SLP Individual Time: 9470-9628 SLP Individual Time Calculation (min): 40 min  Short Term Goals: Week 2: SLP Short Term Goal 1 (Week 2): STG=LTG due to short ELOS (6/15)  Skilled Therapeutic Interventions: Pt seen this date for skilled ST intervention targeting communication goals outlined in care plan. Pt received asleep in bed; aroused easily to name. Agreeable to ST intervention.   Participated in CART protocol for writing single, multi-syllabic words with Mod A, faded to Min-Mod verbal + visual A. Required Min A for self-correction for articulatory errors, specifically with production of initial phonemes in multi-syllabic words. Required Mod to Max multimodal A (writing/visual, model, and verbal prompts) for correct production of 4 syllable words within a structured speech task. Benefited from rest breaks, particularly for writing. Drafted thank you note to his cousin with overall Mod A. No overt word finding difficulties observed.   Pt left in bed with all safety measures activated, call bell within reach, and all immediate needs met. Continue per current ST POC.  Pain Pt denies pain.  Therapy/Group: Individual Therapy  Kolson Chovanec A Ashia Dehner 06/09/2021, 2:46 PM

## 2021-06-10 DIAGNOSIS — R4701 Aphasia: Secondary | ICD-10-CM

## 2021-06-10 DIAGNOSIS — L97821 Non-pressure chronic ulcer of other part of left lower leg limited to breakdown of skin: Secondary | ICD-10-CM

## 2021-06-10 DIAGNOSIS — N3941 Urge incontinence: Secondary | ICD-10-CM

## 2021-06-10 DIAGNOSIS — I872 Venous insufficiency (chronic) (peripheral): Secondary | ICD-10-CM

## 2021-06-10 NOTE — Progress Notes (Signed)
PROGRESS NOTE   Subjective/Complaints:   Pt not requiring I/O caths but has been incontinent  Review of Systems  Constitutional:  Negative for fever.  Respiratory:  Negative for shortness of breath.   Cardiovascular:  Negative for chest pain.  Gastrointestinal:  Negative for abdominal pain, constipation, nausea and vomiting.  Genitourinary:  Positive for frequency (incontinent). Negative for hematuria.      Objective:   DG Lumbar Spine 2-3 Views  Result Date: 06/08/2021 CLINICAL DATA:  Chronic low back pain, history of kyphoplasty EXAM: LUMBAR SPINE - 2-3 VIEW COMPARISON:  Correlation made with CT from March 2023 FINDINGS: Vertebral body heights are similar. Chronic L1 compression fracture with kyphoplasty. Disc space narrowing is greatest at L5-S1. Aortic calcification. IMPRESSION: Similar vertebral body heights. Electronically Signed   By: Macy Mis M.D.   On: 06/08/2021 12:41   No results for input(s): "WBC", "HGB", "HCT", "PLT" in the last 72 hours.  No results for input(s): "NA", "K", "CL", "CO2", "GLUCOSE", "BUN", "CREATININE", "CALCIUM" in the last 72 hours.   Intake/Output Summary (Last 24 hours) at 06/10/2021 1055 Last data filed at 06/10/2021 0738 Gross per 24 hour  Intake 358 ml  Output 500 ml  Net -142 ml      Pressure Injury 05/26/21 Heel Left Stage 3 -  Full thickness tissue loss. Subcutaneous fat may be visible but bone, tendon or muscle are NOT exposed. (Active)  05/26/21 (Initially identified 10/22/20; follow at wound clinic; wife doing dressings)   Location: Heel  Location Orientation: Left  Staging: Stage 3 -  Full thickness tissue loss. Subcutaneous fat may be visible but bone, tendon or muscle are NOT exposed.  Wound Description (Comments):   Present on Admission: Yes     Physical Exam: Vital Signs Blood pressure 117/71, pulse 76, temperature (!) 97.3 F (36.3 C), temperature source  Axillary, resp. rate 17, height 6' (1.829 m), weight 98.2 kg, SpO2 100 %.  General: No acute distress Mood and affect are appropriate Heart: Regular rate and rhythm no rubs murmurs or extra sounds Lungs: Clear to auscultation, breathing unlabored, no rales or wheezes Abdomen: Positive bowel sounds, soft nontender to palpation, nondistended Extremities: No clubbing, cyanosis, or edema Skin: gr 2 decub, with scabbing RIght heel Left pre tib surgical excision with granulating tissue , ~2cm diameter no drainage , Let ant ankle venous stasis ulcer granulating well as below--unna dressing in place   Stasis dermatitis BLE pre tib  Neurologic: Cranial nerves II through XII intact, language improving. Still with some word finding deficits Sensory exam reduced sensation left toes    Musculoskeletal: No joint redness or swelling noted Strength 5 out of 5 in bilateral upper extremities 4 out of 5 bilateral hip flexion 5 out of 5 bilateral knee extension Right ankle PF and DF 2 out of 5, left ankle PF 2 out of 5, DF 1 out of 5.  Tone is normal No joint swelling noted   Assessment/Plan: 1. Functional deficits which require 3+ hours per day of interdisciplinary therapy in a comprehensive inpatient rehab setting. Physiatrist is providing close team supervision and 24 hour management of active medical problems listed below. Physiatrist and rehab team continue  to assess barriers to discharge/monitor patient progress toward functional and medical goals  Care Tool:  Bathing    Body parts bathed by patient: Right arm, Left arm, Chest, Abdomen, Front perineal area, Buttocks, Right upper leg, Left upper leg, Face     Body parts n/a: Right lower leg, Left lower leg   Bathing assist Assist Level: Supervision/Verbal cueing     Upper Body Dressing/Undressing Upper body dressing   What is the patient wearing?: Pull over shirt    Upper body assist Assist Level: Set up assist    Lower Body  Dressing/Undressing Lower body dressing      What is the patient wearing?: Pants     Lower body assist Assist for lower body dressing: Minimal Assistance - Patient > 75%     Toileting Toileting Toileting Activity did not occur (Clothing management and hygiene only): N/A (no void or bm)  Toileting assist Assist for toileting: Maximal Assistance - Patient 25 - 49%     Transfers Chair/bed transfer  Transfers assist     Chair/bed transfer assist level: Minimal Assistance - Patient > 75% (partial squat pivot)     Locomotion Ambulation   Ambulation assist   Ambulation activity did not occur: Safety/medical concerns  Assist level: 2 helpers Assistive device: Parallel bars Max distance: 6-57f   Walk 10 feet activity   Assist  Walk 10 feet activity did not occur: Safety/medical concerns        Walk 50 feet activity   Assist Walk 50 feet with 2 turns activity did not occur: Safety/medical concerns         Walk 150 feet activity   Assist Walk 150 feet activity did not occur: Safety/medical concerns         Walk 10 feet on uneven surface  activity   Assist Walk 10 feet on uneven surfaces activity did not occur: Safety/medical concerns         Wheelchair     Assist Is the patient using a wheelchair?: Yes Type of Wheelchair: Manual    Wheelchair assist level: Independent Max wheelchair distance: 150'    Wheelchair 50 feet with 2 turns activity    Assist        Assist Level: Independent   Wheelchair 150 feet activity     Assist      Assist Level: Independent   Blood pressure 117/71, pulse 76, temperature (!) 97.3 F (36.3 C), temperature source Axillary, resp. rate 17, height 6' (1.829 m), weight 98.2 kg, SpO2 100 %.   Medical Problem List and Plan: 1. Functional deficits secondary to left MCA embolic CVA             -patient may shower             -ELOS/Goals:  06/14/21 PT/OT Min A wheelchair level; SLP Mod  I  -Continue CIR therapies including PT, OT, and SLP    2.  Antithrombotics: -DVT/anticoagulation:  Pharmaceutical: Other (comment) Eliquis now, previously on xarelto             -antiplatelet therapy: N/A 3. Pain: continue Lidoderm patch, Neurontin 200 mg twice daily and 100 mg nightly, tramadol as needed 4. Mood: Namenda 10 mg twice daily, Razadyne 8 mg daily             -antipsychotic agents: N/A 5. Neuropsych: This patient is capable of making decisions on his own behalf. 6. Chronic lower extremity wounds. LLE wound from Mohs procedure. Follow-up wound care nurse. Will examine  today. Consulted wound care nurse to perform dressing change today given worsening pain Appreciate ortho tech,  unna boot replaced Vaseline gauze , 2x2, all covered by Unna boot change weekly  7. Fluids/Electrolytes/Nutrition: Routine in and outs with follow-up chemistries 8.  Atrial fibrillation.  Amiodarone 200 mg daily.  Continue Eliquis.  Cardiac rate controlled 9.  CAD/CABG 2014.  No chest pain or shortness of breath.Continue Imdur 60 mg daily -Denies chest pain  10.  History of severe aortic stenosis status post TAVR procedure 2021.  Follow-up cardiology services 11.  History of PMR.  Currently maintained on ARAVA 10 mg daily as well as chronic prednisone 3 mg daily 12.  BPH.  Proscar 5 mg daily.  Check PVR, Has been using external catheter 13.  Hyperlipidemia.  Lipitor/Zetia 14.  Chronic systolic congestive heart failure.  Demadex 40 mg daily. Imdur '60mg'$  daily.  Elevated Creat, reduced demadex to '20mg'$  qd, monitor for fluid overload    Filed Weights   05/31/21 1541 05/31/21 1753  Weight: 98.2 kg 98.2 kg   Need more regular weights 15.  OSA.  CPAP 16.  GERD.  Protonix 17.  PAD with lymphedema status post balloon angioplasty of posterior tibial artery October 2022.  Follow-up outpatient 18.  Chronic anemia.  Followed by hematology services.  He had been receiving Epogen injections as an outpatient.           cont to monitor weekly , no hematuria     Latest Ref Rng & Units 06/03/2021    6:36 AM 06/02/2021    5:51 AM 06/01/2021    5:29 AM  CBC  WBC 4.0 - 10.5 K/uL  5.5  6.5   Hemoglobin 13.0 - 17.0 g/dL 9.1  10.0  9.1   Hematocrit 39.0 - 52.0 % 29.0  31.9  28.9   Platelets 150 - 400 K/uL  165  160     19. Dementia, continue galantamine and memantine 20. CKD IIIB, avoid nephrotoxic medications Lab Results  Component Value Date   CREATININE 1.71 (H) 06/07/2021   CREATININE 1.41 (H) 06/05/2021   CREATININE 1.55 (H) 06/03/2021   21. HTN continue imdur Vitals:   06/09/21 1948 06/10/21 0409  BP: 133/77 117/71  Pulse: 70 76  Resp: 17 17  Temp: 97.6 F (36.4 C) (!) 97.3 F (36.3 C)  SpO2: 99% 100%   Controlled 6/10  22. Urinary retention with cath induced Hematuria, resolved but still at risk due to cont Int cath as well as anticoag with Eliquis   -6/10 voiding incontinently  -will try timed voids q3 hours while awake. Needs to be out of bed to void  23. Hypokalemia:improved with supplementation     Latest Ref Rng & Units 06/07/2021   10:26 AM 06/05/2021    6:40 AM 06/03/2021    6:36 AM  BMP  Glucose 70 - 99 mg/dL 105  112  115   BUN 8 - 23 mg/dL 31  29  33   Creatinine 0.61 - 1.24 mg/dL 1.71  1.41  1.55   Sodium 135 - 145 mmol/L 136  138  140   Potassium 3.5 - 5.1 mmol/L 4.1  4.2  3.3   Chloride 98 - 111 mmol/L 103  108  106   CO2 22 - 32 mmol/L '24  25  25   '$ Calcium 8.9 - 10.3 mg/dL 9.0  8.8  9.2    Elevated Creat , likely overdiuresis, no LE swelling will reduce demadex over the weekend recheck on  Monday  24. Constipation: add miralax daily prn.  25.  Bilateral foot drop , hx spine surgery in past , last Lumbar MRI 01/08/20 demonstrated moderate L4-5 stenosis, T spine MRI on 08/17/20 demonstrated T11-12 moderate stenossis but no spinal cord impingement, also old T8 comp fx  Repeat Lumbar xray no new findings  LOS: 10 days A FACE TO FACE EVALUATION WAS PERFORMED  Meredith Staggers 06/10/2021, 10:55 AM

## 2021-06-10 NOTE — Progress Notes (Signed)
Patient returned from grounds pass with several new skin tears on arm and leg. Patient with poor safety awareness on transfers and from powered wheelchair foams on all skin tears family notified

## 2021-06-11 LAB — BASIC METABOLIC PANEL
Anion gap: 9 (ref 5–15)
BUN: 30 mg/dL — ABNORMAL HIGH (ref 8–23)
CO2: 24 mmol/L (ref 22–32)
Calcium: 8.8 mg/dL — ABNORMAL LOW (ref 8.9–10.3)
Chloride: 106 mmol/L (ref 98–111)
Creatinine, Ser: 1.5 mg/dL — ABNORMAL HIGH (ref 0.61–1.24)
GFR, Estimated: 43 mL/min — ABNORMAL LOW (ref >60)
Glucose, Bld: 92 mg/dL (ref 70–99)
Potassium: 3.7 mmol/L (ref 3.5–5.1)
Sodium: 139 mmol/L (ref 135–145)

## 2021-06-11 LAB — CBC
HCT: 25.6 % — ABNORMAL LOW (ref 39.0–52.0)
Hemoglobin: 8.3 g/dL — ABNORMAL LOW (ref 13.0–17.0)
MCH: 29.7 pg (ref 26.0–34.0)
MCHC: 32.4 g/dL (ref 30.0–36.0)
MCV: 91.8 fL (ref 80.0–100.0)
Platelets: 151 10*3/uL (ref 150–400)
RBC: 2.79 MIL/uL — ABNORMAL LOW (ref 4.22–5.81)
RDW: 17.2 % — ABNORMAL HIGH (ref 11.5–15.5)
WBC: 4.8 10*3/uL (ref 4.0–10.5)
nRBC: 0 % (ref 0.0–0.2)

## 2021-06-11 NOTE — Progress Notes (Signed)
Occupational Therapy Session Note  Patient Details  Name: [name] [name] MRN: [id number] Date of Birth: January 04, 1926  Today's Date: 06/11/2021 OT Individual Time: 1410-1510 OT Individual Time Calculation (min): 60 min    Short Term Goals: Week 1:  OT Short Term Goal 1 (Week 1): Pt will don shirt with S. OT Short Term Goal 1 - Progress (Week 1): Met OT Short Term Goal 2 (Week 1): Pt will complete toilet transfer with min A. OT Short Term Goal 2 - Progress (Week 1): Met OT Short Term Goal 3 (Week 1): Pt will complete bathing with mod A at sink. OT Short Term Goal 3 - Progress (Week 1): Met OT Short Term Goal 4 (Week 1): Pt will position his RUE when in chair/bed with no more than mod VCs. OT Short Term Goal 4 - Progress (Week 1): Met Week 2:  OT Short Term Goal 1 (Week 2): STG- LTGs d/t LOS  Skilled Therapeutic Interventions/Progress Updates:    Patient seen this day for OT services, the pt was able to roll himself to the gym using his w/c with close supervision. The pt completed dowel rod exercise in various planes, shld flexion, horizontal abduction, and shld rotation, 2 sets of 15 followed by resistive exercises in a variety of planes to improve the pt UB strength for functional task performance and increase safety .  The pt was able to complete ball toss using a 1lb dowel with adjustments in his position to improve UB strength and coordination for gains in his ability to adjust his posture and demonstrate safety during functional task performance.  The pt was able to complete a simulated task in UB dressing with 1 demonstration and initial cues as to how to approach the task, the pt was able to come from sit to stand using the bars of the walking frame in the gym with [place].  He was able to complete a simulated task in LB dressing using the bars in the gym for donning the theraband onto his waist and doffing it at [place].  The pt was able to roll himself back to his room greater than 100 ft with  close supervision.  The pt's alarm was in place with his bedside table call light available.  The pt had no report of pain at the time of treatment and all additional needs were addressed.    Therapy Documentation Precautions:  Precautions Precautions: Fall, Other (comment) Precaution Comments: R hemiparesis, chronic LBP Restrictions Weight Bearing Restrictions: No  Therapy/Group: Individual Therapy  Yvonne Kendall 06/11/2021, 5:41 PM

## 2021-06-11 NOTE — Discharge Summary (Signed)
Physician Discharge Summary  Patient ID: [name] [name] MRN: [id number] DOB/AGE: 1926-02-24 87 y.o.  Admit date: 05/31/2021 Discharge date: 06/14/2021  Discharge Diagnoses:  Principal Problem:   Left middle cerebral artery stroke [facility]) DVT prophylaxis Chronic lower extremity wounds Atrial fibrillation CAD/CABG 2014 Severe aortic stenosis status post TAVR procedure 2021 History of PMR BPH Hyperlipidemia Chronic systolic congestive heart failure Chronic anemia Prediabetes Dementia  Discharged Condition: Stable  Significant Diagnostic Studies: DG Lumbar Spine 2-3 Views  Result Date: 06/08/2021 CLINICAL DATA:  Chronic low back pain, history of kyphoplasty EXAM: LUMBAR SPINE - 2-3 VIEW COMPARISON:  Correlation made with CT from March 2023 FINDINGS: Vertebral body heights are similar. Chronic L1 compression fracture with kyphoplasty. Disc space narrowing is greatest at L5-S1. Aortic calcification. IMPRESSION: Similar vertebral body heights. Electronically Signed   By: Macy Mis M.D.   On: 06/08/2021 12:41   ECHOCARDIOGRAM COMPLETE  Result Date: 05/26/2021    ECHOCARDIOGRAM REPORT   Patient Name:   [name] [name] Date of Exam: 05/26/2021 Medical Rec #:  [id number]     Height:       72.0 in Accession #:    [id number]    Weight:       221.8 lb Date of Birth:  01-16-1926      BSA:          2.226 m Patient Age:    16 years      BP:           113/50 mmHg Patient Gender: M             HR:           63 bpm. Exam Location:  Inpatient Procedure: 2D Echo, Cardiac Doppler and Color Doppler Indications:    Stroke  History:        Patient has prior history of Echocardiogram examinations, most                 recent 12/27/2020. Risk Factors:Hypertension and Sleep Apnea.                 Aortic Valve: 26 mm Sapien prosthetic, stented (TAVR) valve is                 present in the aortic position. Procedure Date: 11/10/19.  Sonographer:    Jefferey Pica Referring Phys: [place]  Sonographer  Comments: Image acquisition challenging due to respiratory motion. IMPRESSIONS  1. Left ventricular ejection fraction, by estimation, is 65 to 70%. The left ventricle has normal function. The left ventricle has no regional wall motion abnormalities. There is severe asymmetric left ventricular hypertrophy of the septal segment. Left  ventricular diastolic parameters are indeterminate.  2. Right ventricular systolic function is normal. The right ventricular size is normal. There is mildly elevated pulmonary artery systolic pressure. The estimated right ventricular systolic pressure is 98.3 mmHg.  3. The mitral valve is degenerative. Trivial mitral valve regurgitation. No evidence of mitral stenosis.  4. The aortic valve has been repaired/replaced. Aortic valve regurgitation is mild and paravalvular. There is a 26 mm Sapien prosthetic (TAVR) valve present in the aortic position. Procedure Date: 11/10/19. Mean systolic gradient 10 mmHg.  5. Aortic dilatation noted. There is mild dilatation of the ascending aorta, measuring 42 mm.  6. The inferior vena cava is normal in size with greater than 50% respiratory variability, suggesting right atrial pressure of 3 mmHg. Conclusion(s)/Recommendation(s): No intracardiac source of embolism detected on this transthoracic study. Consider a transesophageal  echocardiogram to exclude cardiac source of embolism if clinically indicated. FINDINGS  Left Ventricle: Left ventricular ejection fraction, by estimation, is 65 to 70%. The left ventricle has normal function. The left ventricle has no regional wall motion abnormalities. The left ventricular internal cavity size was normal in size. There is  severe asymmetric left ventricular hypertrophy of the septal segment. Left ventricular diastolic parameters are indeterminate. Right Ventricle: The right ventricular size is normal. No increase in right ventricular wall thickness. Right ventricular systolic function is normal. There is mildly  elevated pulmonary artery systolic pressure. The tricuspid regurgitant velocity is 3.02  m/s, and with an assumed right atrial pressure of 3 mmHg, the estimated right ventricular systolic pressure is 84.1 mmHg. Left Atrium: Left atrial size was normal in size. Right Atrium: Right atrial size was normal in size. Pericardium: Trivial pericardial effusion is present. Presence of epicardial fat layer. Mitral Valve: The mitral valve is degenerative in appearance. Mild to moderate mitral annular calcification. Trivial mitral valve regurgitation. No evidence of mitral valve stenosis. Tricuspid Valve: The tricuspid valve is normal in structure. Tricuspid valve regurgitation is mild . No evidence of tricuspid stenosis. Aortic Valve: The aortic valve has been repaired/replaced. Aortic valve regurgitation is mild. No aortic stenosis is present. Aortic valve mean gradient measures 9.9 mmHg. Aortic valve peak gradient measures 16.3 mmHg. Aortic valve area, by VTI measures 2.43 cm. There is a 26 mm Sapien prosthetic, stented (TAVR) valve present in the aortic position. Procedure Date: 11/10/19. Pulmonic Valve: The pulmonic valve was normal in structure. Pulmonic valve regurgitation is trivial. No evidence of pulmonic stenosis. Aorta: Aortic dilatation noted and the aortic root was not well visualized. There is mild dilatation of the ascending aorta, measuring 42 mm. Venous: The inferior vena cava is normal in size with greater than 50% respiratory variability, suggesting right atrial pressure of 3 mmHg. IAS/Shunts: No atrial level shunt detected by color flow Doppler.  LEFT VENTRICLE PLAX 2D LVIDd:         5.20 cm   Diastology LVIDs:         3.40 cm   LV e' lateral:   5.37 cm/s LV PW:         1.30 cm   LV E/e' lateral: 22.0 LV IVS:        1.60 cm LVOT diam:     2.20 cm LV SV:         120 LV SV Index:   54 LVOT Area:     3.80 cm  IVC IVC diam: 2.10 cm LEFT ATRIUM           Index LA diam:      3.90 cm 1.75 cm/m LA Vol (A2C): 49.8  ml 22.37 ml/m LA Vol (A4C): 62.9 ml 28.25 ml/m  AORTIC VALVE                     PULMONIC VALVE AV Area (Vmax):    2.56 cm      PV Vmax:       0.75 m/s AV Area (Vmean):   2.00 cm      PV Peak grad:  2.3 mmHg AV Area (VTI):     2.43 cm AV Vmax:           202.10 cm/s AV Vmean:          169.689 cm/s AV VTI:            0.495 m AV Peak Grad:  16.3 mmHg AV Mean Grad:      9.9 mmHg LVOT Vmax:         136.00 cm/s LVOT Vmean:        89.400 cm/s LVOT VTI:          0.316 m LVOT/AV VTI ratio: 0.64  AORTA Ao Root diam: 3.70 cm Ao Asc diam:  4.15 cm MITRAL VALVE                TRICUSPID VALVE MV Area (PHT): 3.51 cm     TR Peak grad:   36.5 mmHg MV Decel Time: 216 msec     TR Vmax:        302.00 cm/s MV E velocity: 118.00 cm/s MV A velocity: 25.10 cm/s   SHUNTS MV E/A ratio:  4.70         Systemic VTI:  0.32 m                             Systemic Diam: 2.20 cm Cherlynn Kaiser MD Electronically signed by Cherlynn Kaiser MD Signature Date/Time: 05/26/2021/11:59:05 AM    Final    MR BRAIN WO CONTRAST  Result Date: 05/25/2021 CLINICAL DATA:  Acute neuro deficit.  Stroke. EXAM: MRI HEAD WITHOUT CONTRAST TECHNIQUE: Multiplanar, multiecho pulse sequences of the brain and surrounding structures were obtained without intravenous contrast. COMPARISON:  CT angio head and neck 05/25/2021 FINDINGS: Brain: Small acute infarct right occipital cortex. Small acute infarct in the left frontal cortex over the convexity. Small to moderate cortical infarct left operculum. Generalized atrophy. Mild chronic microvascular ischemic changes. Small chronic infarcts in the cerebellum bilaterally. Negative for mass. Chronic hemorrhage in the right frontal lobe. Vascular: Normal arterial flow voids. Skull and upper cervical spine: No focal skeletal lesion. Sinuses/Orbits: Paranasal sinuses clear. Bilateral cataract extraction Other: None IMPRESSION: Small areas of acute infarction in the right occipital lobe and left frontal lobe over the  convexity. Mild to moderate acute infarct left operculum. Possible cerebral emboli. Atrophy and mild chronic microvascular ischemia Electronically Signed   By: Franchot Gallo M.D.   On: 05/25/2021 15:12   CT HEAD CODE STROKE WO CONTRAST  Addendum Date: 05/25/2021   ADDENDUM REPORT: 05/25/2021 12:40 ADDENDUM: Impression #1 called by telephone at the time of interpretation on 05/25/2021 at 11:50 am to provider Dr. Theda Sers, Who verbally acknowledged these results. Electronically Signed   By: Kellie Simmering D.O.   On: 05/25/2021 12:40   Result Date: 05/25/2021 CLINICAL DATA:  Code stroke. Provided history: Neuro deficit, acute, stroke suspected. EXAM: CT HEAD WITHOUT CONTRAST TECHNIQUE: Contiguous axial images were obtained from the base of the skull through the vertex without intravenous contrast. RADIATION DOSE REDUCTION: This exam was performed according to the departmental dose-optimization program which includes automated exposure control, adjustment of the mA and/or kV according to patient size and/or use of iterative reconstruction technique. COMPARISON:  Prior head CT examinations 08/17/2020 and earlier. FINDINGS: Brain: Mild-to-moderate generalized cerebral atrophy. Small left MCA territory cortical infarct within the mid-to-posterior left frontal lobe, new from the prior head CT of 08/17/2020 and likely acute (series 2, images 8-10). Mild ill-defined hypoattenuation within the cerebral white matter, nonspecific but compatible with chronic small vessel ischemic disease. Redemonstrated small chronic infarct within the left cerebellar hemisphere. There is no acute intracranial hemorrhage. No extra-axial fluid collection. No evidence of an intracranial mass. No midline shift. Vascular: No hyperdense vessel. Atherosclerotic calcifications. Skull: No fracture or aggressive osseous  lesion. Sinuses/Orbits: No mass or acute finding within the imaged orbits. Mild mucosal thickening and small mucous retention cysts  within the bilateral ethmoid air cells. ASPECTS [facility] Stroke Program Early CT Score) - Ganglionic level infarction (caudate, lentiform nuclei, internal capsule, insula, M1-M3 cortex): 7 - Supraganglionic infarction (M4-M6 cortex): 2 Total score (0-10 with 10 being normal): 9 Attempts are being made to reach the ordering provider at this time. IMPRESSION: Small left MCA territory cortical infarct within the mid-to-posterior left frontal lobe, new from the prior head CT of 08/17/2020 and likely acute. ASPECTS is 9. Mild chronic small vessel ischemic changes within the cerebral white matter. Redemonstrated small chronic infarct within the left cerebellar hemisphere. Mild-to-moderate generalized cerebral atrophy. Bilateral ethmoid sinusitis. Electronically Signed: By: Kellie Simmering D.O. On: 05/25/2021 11:43   CT ANGIO HEAD NECK W WO CM (CODE STROKE)  Result Date: 05/25/2021 CLINICAL DATA:  Provided history: Neuro deficit, acute, stroke suspected; acute right-sided weakness with expressive aphasia. EXAM: CT ANGIOGRAPHY HEAD AND NECK TECHNIQUE: Multidetector CT imaging of the head and neck was performed using the standard protocol during bolus administration of intravenous contrast. Multiplanar CT image reconstructions and MIPs were obtained to evaluate the vascular anatomy. Carotid stenosis measurements (when applicable) are obtained utilizing NASCET criteria, using the distal internal carotid diameter as the denominator. RADIATION DOSE REDUCTION: This exam was performed according to the departmental dose-optimization program which includes automated exposure control, adjustment of the mA and/or kV according to patient size and/or use of iterative reconstruction technique. CONTRAST:  67m OMNIPAQUE IOHEXOL 350 MG/ML SOLN COMPARISON:  Noncontrast head CT performed immediately prior 05/25/2021. FINDINGS: CTA NECK FINDINGS Aortic arch: Standard aortic branching. Atherosclerotic plaque within the visualized aortic arch  and proximal major branch vessels of the neck. No hemodynamically significant innominate or proximal subclavian artery stenosis. Right carotid system: CCA and ICA patent within the neck. Soft and calcified atherosclerotic plaque about the carotid bifurcation and within the proximal ICA, resulting in less than 50% stenosis at the ICA origin. Left carotid system: CCA and ICA patent within the neck. Predominantly calcified plaque within the CCA, bout the carotid bifurcation and within the proximal ICA without measurable stenosis. Vertebral arteries: Vertebral arteries patent within the neck. The left vertebral artery is dominant. Nonstenotic calcified plaque at the origin of the left vertebral artery. Skeleton: Straightening of the expected cervical lordosis. 2 mm C4-C5 grade 1 anterolisthesis. Cervical spondylosis. Vertebral ankylosis at C2-C3 and C5-C6. No acute fracture or aggressive osseous lesion. Other neck: No neck mass or cervical lymphadenopathy. Upper chest: Prior median sternotomy. Incompletely imaged bilateral pleural effusions. Interlobular septal thickening and ground-glass opacity within the imaged lung apices, likely reflecting edema. Review of the MIP images confirms the above findings CTA HEAD FINDINGS Anterior circulation: The intracranial internal carotid arteries are patent. Calcified plaque within both vessels. Moderate stenosis of the right paraclinoid segment. Otherwise, no more than mild stenosis of the intracranial ICAs. The M1 middle cerebral arteries are patent. No M2 proximal branch occlusion or high-grade proximal stenosis. The anterior cerebral arteries are patent. Posterior circulation: Severe atherosclerotic irregularity with severe multifocal stenoses in the right vertebral artery at the level of skull base and intracranially. Calcified plaque within the proximal V4 left vertebral artery with moderate stenosis. The basilar artery is patent. The posterior cerebral arteries are patent.  The left PCA is fetal in origin. The right posterior communicating artery is diminutive or absent. Venous sinuses: Within the limitations of contrast timing, no convincing thrombus. Anatomic variants: As described.  Review of the MIP images confirms the above findings No emergent large vessel occlusion. This finding and CTA head impression #3 called by telephone at the time of interpretation on 05/25/2021 at 11:50 am to provider [facility] , who verbally acknowledged these results. IMPRESSION: CTA neck: 1. The common carotid and internal carotid arteries are patent within the neck. Atherosclerotic plaque bilaterally, without hemodynamically significant stenosis. 2. Vertebral arteries patent within the neck. Non-stenotic calcified plaque at the origin of the left vertebral artery. 3. Pulmonary edema within the imaged lung apices. 4. Incompletely imaged bilateral pleural effusions. 5. Cervical spondylosis. C2-C3 and C5-C6 vertebral ankylosis. CTA head: 1. No intracranial large vessel occlusion is identified. 2. Intracranial atherosclerotic disease with multifocal stenoses, most notably as follows. 3. Multifocal severe stenoses within the right vertebral artery at the level of the skull base and intracranially. 4. Moderate stenosis within the proximal V4 left vertebral artery. 5. Moderate stenosis of the paraclinoid right ICA. Electronically Signed   By: Kellie Simmering D.O.   On: 05/25/2021 12:15    Labs:  Basic Metabolic Panel: Recent Labs  Lab 06/07/21 1026 06/11/21 0528  NA 136 139  K 4.1 3.7  CL 103 106  CO2 24 24  GLUCOSE 105* 92  BUN 31* 30*  CREATININE 1.71* 1.50*  CALCIUM 9.0 8.8*    CBC: Recent Labs  Lab 06/11/21 0528  WBC 4.8  HGB 8.3*  HCT 25.6*  MCV 91.8  PLT 151    CBG: Recent Labs  Lab 06/08/21 1236  GLUCAP 97   Family history.  Mother with dementia Brother with CVA.  Denies any colon cancer esophageal cancer or rectal cancer  Brief HPI:   KALEO CONDREY is a 88 y.o.  right-handed male with history of AAA, CAD with CABG compression fractures chronic anemia followed by hematology services chronic systolic congestive heart failure atrial fibrillation/AF status post maze on Xarelto, hypertension, hyperlipidemia, PMR maintained on steroids, early dementia maintained on Namenda, chronic lower extremity wounds, OSA with CPAP, PAD with lymphedema status post balloon angioplasty of posterior tibial artery October 2022 recent UTI placed on antibiotic therapy.  Per chart review lives with spouse.  He does have a personal care attendant Monday Wednesday Friday for 3 hours.  Presented 05/25/2021 with right-sided weakness and aphasia of acute onset.  Cranial CT scan showed small left MCA territory cortical infarction within the mid to posterior left frontal lobe.  CT angiogram head and neck no large vessel occlusion.  Multifocal severe stenosis within the right vertebral artery at the level of the skull base and intracranially.  MRI identified small areas of acute infarction right occipital lobe left frontal lobe over the convexity.  Mild to moderate acute infarction left operculum.  Possible cerebral emboli.  Patient did not receive tPA.  Echocardiogram ejection fraction 65 to 70% no wall motion abnormalities.  Admission chemistries unremarkable except BUN 26 creatinine 1.71 hemoglobin 9.9 hemoglobin A1c 5.8 troponin 29-32.  Neurology follow-up currently maintained on Eliquis patient had been on Xarelto prior to admission for atrial fibrillation.  He completed a course of Omnicef for diagnosed UTI prior to hospitalization.  Therapy evaluations completed due to patient's right-sided weakness decreased functional ability and aphasia was admitted for a comprehensive rehab program.   Hospital Course: NASER SCHULD was admitted to rehab 05/31/2021 for inpatient therapies to consist of PT, ST and OT at least three hours five days a week. Past admission physiatrist, therapy team and rehab RN have  worked together to  provide customized collaborative inpatient rehab.  Pertaining to patient's left MCA embolic CVA remained stable currently maintained on Eliquis with follow-up with neurology services.  Chronic back pain with history of compression fractures Lidoderm patch as directed Neurontin twice daily 200 mg 100 mg nightly with tramadol added.  Mood stabilization with Namenda as well as Razadyne.  Chronic lower extremity wounds left lower extremity wounds from Mohs procedure.  Follow-up wound care nurse.  Wound care nurse continue to follow while in the hospital.  Louretta Parma boot had been replaced and changed as directed.  Atrial fibrillation amiodarone 200 mg daily Eliquis ongoing with cardiac rate controlled.  History of CAD with CABG no chest pain shortness of breath.  History of severe aortic stenosis status post TAVR procedure 2021 follow-up cardiology services.  History of PMR maintained on ARAVA as well as chronic prednisone.  BPH Proscar as directed voiding without difficulty.  Hyperlipidemia with Zetia as well as Lipitor.  Chronic systolic congestive heart failure Demadex as directed exhibiting no signs of fluid overload monitoring of renal function.  OSA/CPAP.  Chronic anemia follow-up outpatient hematology services patient has been receiving Epogen injections as an outpatient.  CKD stage III avoid nephrotoxic medications with latest creatinine 1.50.   Blood pressures were monitored on TID basis and soft and monitored     Rehab course: During patient's stay in rehab weekly team conferences were held to monitor patient's progress, set goals and discuss barriers to discharge. At admission, patient required min mod assist at pivot transfers minimal assist 6 feet rolling walker  Physical exam.  Blood pressure 103/63 pulse 64 temperature 97.9 respirations 15 oxygen saturation is 92% room air Constitutional.  No acute distress. HEENT Head.  Normocephalic and atraumatic Eyes.  Pupils round and  reactive to light no discharge without nystagmus Neck.  Supple nontender no JVD without thyromegaly Cardiac regular rate and rhythm without any extra sounds or murmur heard Abdomen.  Soft nontender positive bowel sounds without rebound Respiratory effort normal no respiratory distress without wheeze Neurologic.  Alert follows commands.  Able to name 2 out of 2 objects able to repeat 3 out of 3 words.  He does have some expressive aphasia and dysarthria. Musculoskeletal.  No joint redness or swelling Strength.  5 out of 5 in bilateral upper extremities 4/5 bilateral hip flexion 5/5 bilateral knee extension Right ankle plantarflexion dorsiflexion 2/5, left ankle PF 2/5, DF 1/5. Skin.  Dressings to lower extremities  He/She  has had improvement in activity tolerance, balance, postural control as well as ability to compensate for deficits. He/She has had improvement in functional use RUE/LUE  and RLE/LLE as well as improvement in awareness.  Transfers to power wheelchair with minimal assist due to poor positioning of wheelchair and set up from patient and poor adherence to instructions from physical therapy for proper transfer set up.  Ambulated 25 feet rolling walker minimal assist.  Supine to sit supervision sit to supine using lower extremity support on wheelchair with minimal assist partial stand squat pivot transfer using upper extremity support on wheelchair armrest with contact-guard minimal assist and stand pivot transfers using rolling walker contact-guard minimal assist.  Can brush teeth modified independent.  Completed wheelchair mobility ADL apartment with supervision.  Completed squat pivot to edge of bed from wheelchair with contact-guard.  Therapies educated wife on using energy wisely at home and patient often very capable of completing lower body ADLs.  Full family teaching completed plan discharge to home  Disposition: Discharged home    Diet: Regular  Special Instructions:  No driving smoking or alcohol  Topical care to left lower extremity apply Vaseline gauze to surgical wounds and other wounds of the left lower extremity when Unna boots changed weekly  Foam dressing to left toenail, right thigh.  Change every 3 days or as needed soiling.  Cut small piece of Aquacel applied to right outer ankle and cover with foam, change every Monday Wednesday Friday.  Leave left lower extremity Unna boot in place to be changed per [employer] orders.  Leave right heel cups and mesh to hold in place to right heel with compression stocking in place do not throw away.    Change Unna boots left lower extremity as directed.  Medications at discharge 1.  Tylenol as needed 2.  Amiodarone 200 mg p.o. daily 3.  Eliquis 2.5 mg p.o. twice daily 4.  Lipitor 40 mg p.o. daily 5.  Zetia 10 mg p.o. daily 6.  Proscar 5 mg p.o. daily 7.  Neurontin 200 mg p.o. twice daily and 100 mg every evening 8.  Razadyne 8 mg p.o. daily 9.  Imdur 60 mg p.o. daily 10.Arava 10 mg p.o. daily 11.  Lidoderm patch as directed 12.  Namenda 10 mg p.o. twice daily 13.  Multivitamin daily 14.  Nitroglycerin as needed 15.  Protonix 40 mg every other day 16.  Prednisone 3 mg p.o. daily with breakfast 17.  Flomax 0.4 mg daily 18.  Demadex 40 mg p.o. daily 19.  Tramadol 25 mg every 6 hours as needed pain 20.  Potassium Micro-K 10 mEq daily   30-35 minutes were spent completing discharge summary and discharge planning  Discharge Instructions     Ambulatory referral to Neurology   Complete by: As directed    An appointment is requested in approximately: 4 weeks left MCA infarction   Ambulatory referral to Physical Medicine Rehab   Complete by: As directed    Moderate complexity follow-up 1 to 2 weeks left MCA infarction.      Allergies as of 06/14/2021       Reactions   Amoxicillin Other (See Comments)   Headache, debilitated   Bee Venom Anaphylaxis   Avelox [moxifloxacin] Other (See Comments)    Unknown   Codeine Other (See Comments)   Duloxetine Hcl Other (See Comments)   felt weird   Levitra [vardenafil] Other (See Comments)   Unknown   Morphine And Related Other (See Comments)   hypotension   Penicillin G Sodium Other (See Comments)   Severe headache, fatigue "debilitated"   Testosterone Other (See Comments)   unknown   Tizanidine Other (See Comments)   unknown   Viagra [sildenafil] Other (See Comments)   didn't like it        Medication List     STOP taking these medications    EPINEPHrine 0.3 mg/0.3 mL Soaj injection Commonly known as: EPI-PEN   feeding supplement Liqd   metoprolol succinate 25 MG 24 hr tablet Commonly known as: TOPROL-XL   potassium chloride 10 MEQ CR capsule Commonly known as: MICRO-K Replaced by: potassium chloride 10 MEQ tablet   Vitamin C 500 MG Caps Replaced by: vitamin C 500 MG tablet       TAKE these medications    acetaminophen 325 MG tablet Commonly known as: TYLENOL Take 2 tablets (650 mg total) by mouth every 4 (four) hours as needed for mild pain (or temp > 37.5 C (99.5 F)). What changed:  medication strength how much to take when to take this reasons to take this additional instructions   amiodarone 200 MG tablet Commonly known as: PACERONE Take 1 tablet (200 mg total) by mouth daily.   apixaban 2.5 MG Tabs tablet Commonly known as: ELIQUIS Take 1 tablet (2.5 mg total) by mouth 2 (two) times daily.   atorvastatin 40 MG tablet Commonly known as: LIPITOR Take 1 tablet (40 mg total) by mouth daily.   cadexomer iodine 0.9 % gel Commonly known as: IODOSORB Apply 1 application. topically every other day as needed for wound care.   cholecalciferol 25 MCG (1000 UNIT) tablet Commonly known as: VITAMIN D3 Take 1 tablet (1,000 Units total) by mouth daily.   denosumab 60 MG/ML Sosy injection Commonly known as: PROLIA Inject 60 mg into the skin every 6 (six) months.   docusate sodium 100 MG  capsule Commonly known as: COLACE Take 1 capsule (100 mg total) by mouth 2 (two) times daily. What changed: when to take this   ezetimibe 10 MG tablet Commonly known as: ZETIA Take 1 tablet (10 mg total) by mouth daily.   finasteride 5 MG tablet Commonly known as: PROSCAR Take 1 tablet (5 mg total) by mouth daily.   fish oil-omega-3 fatty acids 1000 MG capsule Take 1 capsule (1 g total) by mouth daily.   gabapentin 100 MG capsule Commonly known as: NEURONTIN Take 2 capsules by mouth in the morning, 1 capsule in the evening, and 2 capsules at bedtime. What changed: additional instructions   galantamine 8 MG 24 hr capsule Commonly known as: RAZADYNE ER Take 1 capsule (8 mg total) by mouth daily with breakfast. What changed: when to take this   hydrALAZINE 25 MG tablet Commonly known as: APRESOLINE Take 1 tablet (25 mg total) by mouth 2 (two) times daily.   isosorbide mononitrate 60 MG 24 hr tablet Commonly known as: IMDUR Take 1 tablet (60 mg total) by mouth daily. What changed: See the new instructions.   leflunomide 20 MG tablet Commonly known as: ARAVA Take 1/2 tablets (10 mg total) by mouth daily.   lidocaine 5 % Commonly known as: LIDODERM Place 1 patch onto the skin daily. Remove & Discard patch within 12 hours or as directed by MD   Magnesium 250 MG Tabs Take 1 tablet (250 mg total) by mouth daily.   memantine 10 MG tablet Commonly known as: NAMENDA Take 1 tablet (10 mg total) by mouth 2 (two) times daily.   multivitamin with minerals Tabs tablet Take 1 tablet by mouth daily.   naftifine 1 % cream Commonly known as: NAFTIN Apply 1 application. topically daily as needed (fungal skin infection).   nitroGLYCERIN 0.4 MG SL tablet Commonly known as: Nitrostat Place 1 tablet (0.4 mg total) under the tongue every 5 (five) minutes x 3 doses as needed for chest pain.   pantoprazole 40 MG tablet Commonly known as: PROTONIX Take 1 tablet (40 mg total) by mouth  every other day.   potassium chloride 10 MEQ tablet Commonly known as: KLOR-CON M Take 1 tablet by mouth everyday with furosemide Replaces: potassium chloride 10 MEQ CR capsule   predniSONE 1 MG tablet Commonly known as: DELTASONE Take 3 tablets (3 mg total) by mouth daily with breakfast. What changed: additional instructions   Psyllium Husk Powd Take 1 Scoop by mouth daily.   tamsulosin 0.4 MG Caps capsule Commonly known as: FLOMAX Take 1 capsule (0.4 mg total) by mouth daily after supper.   torsemide  20 MG tablet Commonly known as: DEMADEX Take 2 tablets (40 mg total) by mouth daily.   traMADol 50 MG tablet Commonly known as: ULTRAM Take 1/2 tablets (25 mg total) by mouth every 6 (six) hours as needed for moderate pain.   Vitamin B-12 5000 MCG Tbdp Take 5,000 mcg by mouth daily.   vitamin C 500 MG tablet Commonly known as: ASCORBIC ACID Take 1 tablet (500 mg total) by mouth daily. Replaces: Vitamin C 500 MG Caps        Follow-up Information     Kirsteins, Luanna Salk, MD Follow up.   Specialty: Physical Medicine and Rehabilitation Why: Office to call for appointment Contact information: [place] [address] [phone]                 Signed: Cathlyn Parsons 06/14/2021, 5:18 AM

## 2021-06-11 NOTE — Progress Notes (Signed)
PROGRESS NOTE   Subjective/Complaints:   Pt did better with timed voids yesterday. Acquired skin tears while on grounds pass yesterday  Review of Systems  Constitutional:  Negative for fever.  Respiratory:  Negative for shortness of breath.   Cardiovascular:  Negative for chest pain.  Gastrointestinal:  Negative for abdominal pain, constipation, nausea and vomiting.  Genitourinary:  Positive for frequency (incontinent). Negative for hematuria.      Objective:   No results found. Recent Labs    06/11/21 0528  WBC 4.8  HGB 8.3*  HCT 25.6*  PLT 151    Recent Labs    06/11/21 0528  NA 139  K 3.7  CL 106  CO2 24  GLUCOSE 92  BUN 30*  CREATININE 1.50*  CALCIUM 8.8*     Intake/Output Summary (Last 24 hours) at 06/11/2021 0949 Last data filed at 06/11/2021 0720 Gross per 24 hour  Intake 840 ml  Output 425 ml  Net 415 ml      Pressure Injury 05/26/21 Heel Left Stage 3 -  Full thickness tissue loss. Subcutaneous fat may be visible but bone, tendon or muscle are NOT exposed. (Active)  05/26/21 (Initially identified 10/22/20; follow at wound clinic; wife doing dressings)   Location: Heel  Location Orientation: Left  Staging: Stage 3 -  Full thickness tissue loss. Subcutaneous fat may be visible but bone, tendon or muscle are NOT exposed.  Wound Description (Comments):   Present on Admission: Yes     Physical Exam: Vital Signs Blood pressure (!) 134/59, pulse 65, temperature 98.2 F (36.8 C), resp. rate 18, height 6' (1.829 m), weight 98.2 kg, SpO2 100 %.  Constitutional: No distress . Vital signs reviewed. HEENT: NCAT, EOMI, oral membranes moist Neck: supple Cardiovascular: RRR without murmur. No JVD    Respiratory/Chest: CTA Bilaterally without wheezes or rales. Normal effort    GI/Abdomen: BS +, non-tender, non-distended Ext: no clubbing, cyanosis, or edema Psych: pleasant and cooperative  Skin: gr  2 decub, with scabbing RIght heel, a few other assorted tears and abrasions. Left pre tib surgical excision with granulating tissue , ~2cm diameter no drainage , Let ant ankle venous stasis ulcer granulating well as below--unna dressing in place   Stasis dermatitis BLE pre tib  Neurologic: Cranial nerves II through XII intact, language improving. Still with some word finding deficits but does well with extra time Sensory exam reduced sensation left toes    Musculoskeletal: No joint redness or swelling noted Strength 5 out of 5 in bilateral upper extremities 4 out of 5 bilateral hip flexion 5 out of 5 bilateral knee extension Right ankle PF and DF 2 out of 5, left ankle PF 2 out of 5, DF 1 out of 5.  Tone is normal No joint swelling noted   Assessment/Plan: 1. Functional deficits which require 3+ hours per day of interdisciplinary therapy in a comprehensive inpatient rehab setting. Physiatrist is providing close team supervision and 24 hour management of active medical problems listed below. Physiatrist and rehab team continue to assess barriers to discharge/monitor patient progress toward functional and medical goals  Care Tool:  Bathing    Body parts bathed by patient: Right  arm, Left arm, Chest, Abdomen, Front perineal area, Buttocks, Right upper leg, Left upper leg, Face     Body parts n/a: Right lower leg, Left lower leg   Bathing assist Assist Level: Supervision/Verbal cueing     Upper Body Dressing/Undressing Upper body dressing   What is the patient wearing?: Pull over shirt    Upper body assist Assist Level: Set up assist    Lower Body Dressing/Undressing Lower body dressing      What is the patient wearing?: Pants     Lower body assist Assist for lower body dressing: Minimal Assistance - Patient > 75%     Toileting Toileting Toileting Activity did not occur (Clothing management and hygiene only): N/A (no void or bm)  Toileting assist Assist for toileting:  Maximal Assistance - Patient 25 - 49%     Transfers Chair/bed transfer  Transfers assist     Chair/bed transfer assist level: Minimal Assistance - Patient > 75% (partial squat pivot)     Locomotion Ambulation   Ambulation assist   Ambulation activity did not occur: Safety/medical concerns  Assist level: 2 helpers Assistive device: Parallel bars Max distance: 6-7f   Walk 10 feet activity   Assist  Walk 10 feet activity did not occur: Safety/medical concerns        Walk 50 feet activity   Assist Walk 50 feet with 2 turns activity did not occur: Safety/medical concerns         Walk 150 feet activity   Assist Walk 150 feet activity did not occur: Safety/medical concerns         Walk 10 feet on uneven surface  activity   Assist Walk 10 feet on uneven surfaces activity did not occur: Safety/medical concerns         Wheelchair     Assist Is the patient using a wheelchair?: Yes Type of Wheelchair: Manual    Wheelchair assist level: Independent Max wheelchair distance: 150'    Wheelchair 50 feet with 2 turns activity    Assist        Assist Level: Independent   Wheelchair 150 feet activity     Assist      Assist Level: Independent   Blood pressure (!) 134/59, pulse 65, temperature 98.2 F (36.8 C), resp. rate 18, height 6' (1.829 m), weight 98.2 kg, SpO2 100 %.   Medical Problem List and Plan: 1. Functional deficits secondary to left MCA embolic CVA             -patient may shower             -ELOS/Goals:  06/14/21 PT/OT Min A wheelchair level; SLP Mod I  -Continue CIR therapies including PT, OT, and SLP     2.  Antithrombotics: -DVT/anticoagulation:  Pharmaceutical: Other (comment) Eliquis now, previously on xarelto             -antiplatelet therapy: N/A 3. Pain: continue Lidoderm patch, Neurontin 200 mg twice daily and 100 mg nightly, tramadol as needed 4. Mood: Namenda 10 mg twice daily, Razadyne 8 mg daily              -antipsychotic agents: N/A 5. Neuropsych: This patient is capable of making decisions on his own behalf. 6. Chronic lower extremity wounds. LLE wound from Mohs procedure. Follow-up wound care nurse. Will examine today. Consulted wound care nurse to perform dressing change today given worsening pain Appreciate ortho tech,  unna boot replaced Vaseline gauze , 2x2, all covered by [employer]  boot change weekly  7. Fluids/Electrolytes/Nutrition: Routine in and outs with follow-up chemistries 8.  Atrial fibrillation.  Amiodarone 200 mg daily.  Continue Eliquis.  Cardiac rate controlled 9.  CAD/CABG 2014.  No chest pain or shortness of breath.Continue Imdur 60 mg daily -no current cp 10.  History of severe aortic stenosis status post TAVR procedure 2021.  Follow-up cardiology services 11.  History of PMR.  Currently maintained on ARAVA 10 mg daily as well as chronic prednisone 3 mg daily 12.  BPH.  Proscar 5 mg daily.  Check PVR, Has been using external catheter 13.  Hyperlipidemia.  Lipitor/Zetia 14.  Chronic systolic congestive heart failure.  Demadex 40 mg daily. Imdur '60mg'$  daily.  Elevated Creat, reduced demadex to '20mg'$  qd, monitor for fluid overload    Filed Weights   05/31/21 1541 05/31/21 1753  Weight: 98.2 kg 98.2 kg   6/11 Need more regular weights--ordered 6/10--still not done 15.  OSA.  CPAP 16.  GERD.  Protonix 17.  PAD with lymphedema status post balloon angioplasty of posterior tibial artery October 2022.  Follow-up outpatient 18.  Chronic anemia.  Followed by hematology services.  He had been receiving Epogen injections as an outpatient.          6/11 cont to monitor weekly , no hematuria or gross bleeding     Latest Ref Rng & Units 06/11/2021    5:28 AM 06/03/2021    6:36 AM 06/02/2021    5:51 AM  CBC  WBC 4.0 - 10.5 K/uL 4.8   5.5   Hemoglobin 13.0 - 17.0 g/dL 8.3  9.1  10.0   Hematocrit 39.0 - 52.0 % 25.6  29.0  31.9   Platelets 150 - 400 K/uL 151   165     19. Dementia,  continue galantamine and memantine 20. CKD IIIB, avoid nephrotoxic medications Lab Results  Component Value Date   CREATININE 1.50 (H) 06/11/2021   CREATININE 1.71 (H) 06/07/2021   CREATININE 1.41 (H) 06/05/2021   21. HTN continue imdur Vitals:   06/10/21 1916 06/11/21 0412  BP: (!) 112/58 (!) 134/59  Pulse: 69 65  Resp: 18 18  Temp: 98.5 F (36.9 C) 98.2 F (36.8 C)  SpO2: 100% 100%   Controlled 6/10  22. Urinary retention with cath induced Hematuria, resolved but still at risk due to cont Int cath as well as anticoag with Eliquis   -6/11 voiding incontinently/continently yesterday  -continue timed voids q3 hours while awake. Needs to be out of bed to void  23. Hypokalemia:improved with supplementation     Latest Ref Rng & Units 06/11/2021    5:28 AM 06/07/2021   10:26 AM 06/05/2021    6:40 AM  BMP  Glucose 70 - 99 mg/dL 92  105  112   BUN 8 - 23 mg/dL '30  31  29   '$ Creatinine 0.61 - 1.24 mg/dL 1.50  1.71  1.41   Sodium 135 - 145 mmol/L 139  136  138   Potassium 3.5 - 5.1 mmol/L 3.7  4.1  4.2   Chloride 98 - 111 mmol/L 106  103  108   CO2 22 - 32 mmol/L '24  24  25   '$ Calcium 8.9 - 10.3 mg/dL 8.8  9.0  8.8    Elevated Creat , likely overdiuresis, Cr improved today to 1.5--continue with current plan  24. Constipation: added miralax daily prn.  -lbm 6/10  25.  Bilateral foot drop , hx spine surgery in past ,  last Lumbar MRI 01/08/20 demonstrated moderate L4-5 stenosis, T spine MRI on 08/17/20 demonstrated T11-12 moderate stenossis but no spinal cord impingement, also old T8 comp fx  Repeat Lumbar xray no new findings  LOS: 11 days A FACE TO FACE EVALUATION WAS PERFORMED  Meredith Staggers 06/11/2021, 9:49 AM

## 2021-06-12 NOTE — Progress Notes (Signed)
Patient ID: [name] [name], male   DOB: 15-May-1926, 88 y.o.   MRN: [id number]  Family education moved to tomorrow 1-4PM

## 2021-06-12 NOTE — Progress Notes (Signed)
Speech Language Pathology Daily Session Note  Patient Details  Name: [name] [name] MRN: [id number] Date of Birth: 09-Aug-1926  Today's Date: 06/12/2021 SLP Individual Time: 1107-1202 SLP Individual Time Calculation (min): 55 min  Short Term Goals: Week 2: SLP Short Term Goal 1 (Week 2): STG=LTG due to short ELOS (6/15)  Skilled Therapeutic Interventions: Pt seen this date for skilled ST intervention targeting expressive communication goals outlined in care plan. Pt awake/alert and OOB in w/c. Agreeable to ST intervention.   SLP facilitated today's session by providing Sup A for self-monitoring of motor planning errors during unstructured conversational task, though self-corrected verbal errors ~90% of the time when provided with additional time. Completed complex, functional verbal problem-solving task with Sup A, as well as alternating visual and auditory attention task for up to 20 minutes with Min A. Overall improvement in fluency when pt closed his eyes. Mod A for correcting syntactic errors in writing at the sentence level.   Pt left in w/c with all safety measures activated. Call bell within reach and all immediate needs met. Continue per current ST POC.   Pain Chronic pain in back; medication received recently.  Therapy/Group: Individual Therapy  Korissa Horsford A Taliyah Watrous 06/12/2021, 1:17 PM

## 2021-06-12 NOTE — Progress Notes (Signed)
Physical Therapy Session Note  Patient Details  Name: [name] [name] MRN: [id number] Date of Birth: [birth year]  Today's Date: 06/12/2021 PT Individual Time: 0915-1030 PT Individual Time Calculation (min): 75 min   Short Term Goals: Week 1:  PT Short Term Goal 1 (Week 1): Pt will consistently perform supine<>sit with min assist PT Short Term Goal 1 - Progress (Week 1): Progressing toward goal PT Short Term Goal 2 (Week 1): Pt will perform sit<>stands using LRAD with CGA consistently PT Short Term Goal 2 - Progress (Week 1): Progressing toward goal PT Short Term Goal 3 (Week 1): Pt will perform bed<>chair transfers using LRAD with CGA consistently PT Short Term Goal 3 - Progress (Week 1): Progressing toward goal PT Short Term Goal 4 (Week 1): Pt will ambulate at least 29f using LRAD with min assist of 1 and +2 w/c follow if needed PT Short Term Goal 4 - Progress (Week 1): Met Week 2:  PT Short Term Goal 1 (Week 2): = to LTGs based on ELOS Week 3:     Skilled Therapeutic Interventions/Progress Updates:   Pt initially oob in wc. Wc propulsion [id number] Ues Sit to stand to rollator, pushes heavily against wc causing slight tipping of wc.  Pt c/o back pain/unable to/declines ambulation due to pain. Nursing notified and pain meds provided to pt. stand pivot transfer w/cga w/rollator. To mat.  Sitting balance/RUE coordination task via crossbody reaching to clothespins and transferring to box/all three levels/all tensions using R hand, repeated L/R and R/L  Seated pegboard using R hand, unsupported sitting, fine motor actvity, R hand disyncoordination w/task, additional time required   Sit to supine w/min assist for LE management, wedge and pillows for back support  Therex for LE/to address back pain Supine clamshells x 15 Pelvic tilts w/cueing x 15 Partial bridge x 12 Partial lower trunk rotation x 15  Supine to side to sit w/min assist. stand pivot transfer to wc w/min assist,  rollator, cues for safety. Transported to room. Pt left oob in wc w/alarm belt set and needs in reach    Therapy Documentation Precautions:  Precautions Precautions: Fall, Other (comment) Precaution Comments: R hemiparesis, chronic LBP Restrictions Weight Bearing Restrictions: No   Other Treatments:      Therapy/Group: Individual Therapy BaCallie FieldingPTSalem/12/2021, 12:14 PM

## 2021-06-12 NOTE — Progress Notes (Signed)
Physical Therapy Session Note  Patient Details  Name: [name] [name] MRN: [id number] Date of Birth: Dec 21, 1926  Today's Date: 06/12/2021 PT Individual Time: 0800-0855 PT Individual Time Calculation (min): 55 min   Short Term Goals: Week 2:  PT Short Term Goal 1 (Week 2): = to LTGs based on ELOS  Skilled Therapeutic Interventions/Progress Updates:    Pt received seated in bed, agreeable to PT session. Pt reports chronic pain, not rated, declines intervention. Supine to sit with Supervision with increased time needed to complete. Stand pivot transfer bed to w/c with min A with max cues for safety as pt initiates unsafe turning technique and turns all the way around vs just squat pivoting to chair. Pt requesting to shower this AM. Stand pivot transfer w/c to shower chair with min A. Pt is Supervision for bathing at shower level while seated on shower chair with use of hand-held shower head. Wrapped unna boot on LLE in plastic prior to showering. Stand pivot transfer back to w/c with min A. Pt is able to dry off UB and perform UB dressing with setup A. Pt requires assist for drying off LB and donning Depends, pants, and clean socks. Assisted pt with applying clean, dry Mepilex to R heel and to L hand. Pt is setup A for oral hygiene and combing his hair while seated in w/c at sink. Pt left seated in w/c in room with needs in reach, quick release belt in place.  Therapy Documentation Precautions:  Precautions Precautions: Fall, Other (comment) Precaution Comments: R hemiparesis, chronic LBP Restrictions Weight Bearing Restrictions: No       Therapy/Group: Individual Therapy   Excell Seltzer, PT, DPT, CSRS 06/12/2021, 9:55 AM

## 2021-06-12 NOTE — Progress Notes (Signed)
Pt has home CPAP at bedside and called to have more water added to humidification chamber. Sterile water added and patient stated he needed no other assistance.

## 2021-06-12 NOTE — Progress Notes (Signed)
PROGRESS NOTE   Subjective/Complaints:  Sen in PT today .  No new issues, expresses difficulty with speech therapy in general Low back pain this am requiring medication   Review of Systems  Constitutional:  Negative for fever.  Respiratory:  Negative for shortness of breath.   Cardiovascular:  Negative for chest pain.  Gastrointestinal:  Negative for abdominal pain, constipation, nausea and vomiting.  Genitourinary:  Positive for frequency (incontinent). Negative for hematuria.      Objective:   No results found. Recent Labs    06/11/21 0528  WBC 4.8  HGB 8.3*  HCT 25.6*  PLT 151    Recent Labs    06/11/21 0528  NA 139  K 3.7  CL 106  CO2 24  GLUCOSE 92  BUN 30*  CREATININE 1.50*  CALCIUM 8.8*     Intake/Output Summary (Last 24 hours) at 06/12/2021 0950 Last data filed at 06/12/2021 0752 Gross per 24 hour  Intake 639 ml  Output 200 ml  Net 439 ml      Pressure Injury 05/26/21 Heel Left Stage 3 -  Full thickness tissue loss. Subcutaneous fat may be visible but bone, tendon or muscle are NOT exposed. (Active)  05/26/21 (Initially identified 10/22/20; follow at wound clinic; wife doing dressings)   Location: Heel  Location Orientation: Left  Staging: Stage 3 -  Full thickness tissue loss. Subcutaneous fat may be visible but bone, tendon or muscle are NOT exposed.  Wound Description (Comments):   Present on Admission: Yes     Physical Exam: Vital Signs Blood pressure (!) 121/58, pulse 84, temperature (!) 97.1 F (36.2 C), temperature source Axillary, resp. rate 16, height 6' (1.829 m), weight 93.5 kg, SpO2 98 %.  Constitutional: No distress . Vital signs reviewed. HEENT: NCAT, EOMI, oral membranes moist Neck: supple Cardiovascular: RRR without murmur. No JVD    Respiratory/Chest: CTA Bilaterally without wheezes or rales. Normal effort    GI/Abdomen: BS +, non-tender, non-distended Ext: no  clubbing, cyanosis, or edema Psych: pleasant and cooperative  Skin: gr 2 decub, with scabbing RIght heel, a few other assorted tears and abrasions. Left pre tib surgical excision with granulating tissue , ~2cm diameter no drainage , Let ant ankle venous stasis ulcer granulating well as below--unna dressing in place   Stasis dermatitis BLE pre tib  Neurologic: Cranial nerves II through XII intact, language improving. Still with some word finding deficits but does well with extra time Sensory exam reduced sensation left toes    Musculoskeletal: No joint redness or swelling noted Strength 5 out of 5 in bilateral upper extremities 4 out of 5 bilateral hip flexion 5 out of 5 bilateral knee extension Right ankle PF and DF 2 out of 5, left ankle PF 2 out of 5, DF 1 out of 5.  Tone is normal No joint swelling noted   Assessment/Plan: 1. Functional deficits which require 3+ hours per day of interdisciplinary therapy in a comprehensive inpatient rehab setting. Physiatrist is providing close team supervision and 24 hour management of active medical problems listed below. Physiatrist and rehab team continue to assess barriers to discharge/monitor patient progress toward functional and medical goals  Care  Tool:  Bathing    Body parts bathed by patient: Right arm, Left arm, Chest, Abdomen, Front perineal area, Buttocks, Right upper leg, Left upper leg, Face     Body parts n/a: Right lower leg, Left lower leg   Bathing assist Assist Level: Supervision/Verbal cueing     Upper Body Dressing/Undressing Upper body dressing   What is the patient wearing?: Pull over shirt    Upper body assist Assist Level: Set up assist    Lower Body Dressing/Undressing Lower body dressing      What is the patient wearing?: Pants     Lower body assist Assist for lower body dressing: Minimal Assistance - Patient > 75%     Toileting Toileting Toileting Activity did not occur (Clothing management and  hygiene only): N/A (no void or bm)  Toileting assist Assist for toileting: Maximal Assistance - Patient 25 - 49%     Transfers Chair/bed transfer  Transfers assist     Chair/bed transfer assist level: Minimal Assistance - Patient > 75% (partial squat pivot)     Locomotion Ambulation   Ambulation assist   Ambulation activity did not occur: Safety/medical concerns  Assist level: 2 helpers Assistive device: Parallel bars Max distance: 6-49f   Walk 10 feet activity   Assist  Walk 10 feet activity did not occur: Safety/medical concerns        Walk 50 feet activity   Assist Walk 50 feet with 2 turns activity did not occur: Safety/medical concerns         Walk 150 feet activity   Assist Walk 150 feet activity did not occur: Safety/medical concerns         Walk 10 feet on uneven surface  activity   Assist Walk 10 feet on uneven surfaces activity did not occur: Safety/medical concerns         Wheelchair     Assist Is the patient using a wheelchair?: Yes Type of Wheelchair: Manual    Wheelchair assist level: Independent Max wheelchair distance: 150'    Wheelchair 50 feet with 2 turns activity    Assist        Assist Level: Independent   Wheelchair 150 feet activity     Assist      Assist Level: Independent   Blood pressure (!) 121/58, pulse 84, temperature (!) 97.1 F (36.2 C), temperature source Axillary, resp. rate 16, height 6' (1.829 m), weight 93.5 kg, SpO2 98 %.   Medical Problem List and Plan: 1. Functional deficits secondary to left MCA embolic CVA             -patient may shower             -ELOS/Goals:  06/14/21 PT/OT Min A wheelchair level; SLP Mod I  -Continue CIR therapies including PT, OT, and SLP     2.  Antithrombotics: -DVT/anticoagulation:  Pharmaceutical: Other (comment) Eliquis now, previously on xarelto             -antiplatelet therapy: N/A 3. Pain: continue Lidoderm patch, Neurontin 200 mg twice  daily and 100 mg nightly, tramadol as needed 4. Mood: Namenda 10 mg twice daily, Razadyne 8 mg daily             -antipsychotic agents: N/A 5. Neuropsych: This patient is capable of making decisions on his own behalf. 6. Chronic lower extremity wounds. LLE wound from Mohs procedure. Follow-up wound care nurse. Will examine today. Consulted wound care nurse to perform dressing change today given worsening  pain Appreciate ortho tech,  unna boot replaced Vaseline gauze , 2x2, all covered by Unna boot change weekly  7. Fluids/Electrolytes/Nutrition: Routine in and outs with follow-up chemistries 8.  Atrial fibrillation.  Amiodarone 200 mg daily.  Continue Eliquis.  Cardiac rate controlled 9.  CAD/CABG 2014.  No chest pain or shortness of breath.Continue Imdur 60 mg daily -no current cp 10.  History of severe aortic stenosis status post TAVR procedure 2021.  Follow-up cardiology services 11.  History of PMR.  Currently maintained on ARAVA 10 mg daily as well as chronic prednisone 3 mg daily 12.  BPH.  Proscar 5 mg daily.  Check PVR, Has been using external catheter 13.  Hyperlipidemia.  Lipitor/Zetia 14.  Chronic systolic congestive heart failure.  Demadex 40 mg daily. Imdur '60mg'$  daily.  Elevated Creat, reduced demadex to '20mg'$  qd, monitor for fluid overload    Filed Weights   05/31/21 1753 06/12/21 0358 06/12/21 0402  Weight: 98.2 kg 93.5 kg 93.5 kg   6/11 Need more regular weights--ordered 6/10--still not done 15.  OSA.  CPAP 16.  GERD.  Protonix 17.  PAD with lymphedema status post balloon angioplasty of posterior tibial artery October 2022.  Follow-up outpatient 18.  Chronic anemia.  Followed by hematology services.  He had been receiving Epogen injections as an outpatient.          6/11 cont to monitor weekly , no hematuria or gross bleeding     Latest Ref Rng & Units 06/11/2021    5:28 AM 06/03/2021    6:36 AM 06/02/2021    5:51 AM  CBC  WBC 4.0 - 10.5 K/uL 4.8   5.5   Hemoglobin 13.0 -  17.0 g/dL 8.3  9.1  10.0   Hematocrit 39.0 - 52.0 % 25.6  29.0  31.9   Platelets 150 - 400 K/uL 151   165     19. Dementia, continue galantamine and memantine 20. CKD IIIB, avoid nephrotoxic medications Lab Results  Component Value Date   CREATININE 1.50 (H) 06/11/2021   CREATININE 1.71 (H) 06/07/2021   CREATININE 1.41 (H) 06/05/2021   21. HTN continue imdur Vitals:   06/11/21 2022 06/12/21 0358  BP: (!) 116/54 (!) 121/58  Pulse: (!) 55 84  Resp: 15 16  Temp: 97.6 F (36.4 C) (!) 97.1 F (36.2 C)  SpO2: 95% 98%   Controlled 6/10  22. Urinary retention with cath induced Hematuria, resolved but still at risk due to cont Int cath as well as anticoag with Eliquis   -6/11 voiding incontinently/continently yesterday  -continue timed voids q3 hours while awake. Needs to be out of bed to void  23. Hypokalemia:improved with supplementation     Latest Ref Rng & Units 06/11/2021    5:28 AM 06/07/2021   10:26 AM 06/05/2021    6:40 AM  BMP  Glucose 70 - 99 mg/dL 92  105  112   BUN 8 - 23 mg/dL '30  31  29   '$ Creatinine 0.61 - 1.24 mg/dL 1.50  1.71  1.41   Sodium 135 - 145 mmol/L 139  136  138   Potassium 3.5 - 5.1 mmol/L 3.7  4.1  4.2   Chloride 98 - 111 mmol/L 106  103  108   CO2 22 - 32 mmol/L '24  24  25   '$ Calcium 8.9 - 10.3 mg/dL 8.8  9.0  8.8    Elevated Creat , likely overdiuresis, Cr improved today to 1.5--continue with current plan  24. Constipation: added miralax daily prn.  -lbm 6/10  25.  Bilateral foot drop , hx spine surgery in past , last Lumbar MRI 01/08/20 demonstrated moderate L4-5 stenosis, T spine MRI on 08/17/20 demonstrated T11-12 moderate stenosis but no spinal cord impingement, also old T8 comp fx  Repeat Lumbar xray no new findings  LOS: 12 days A FACE TO FACE EVALUATION WAS PERFORMED  Charlett Blake 06/12/2021, 9:50 AM

## 2021-06-13 ENCOUNTER — Other Ambulatory Visit (HOSPITAL_COMMUNITY): Payer: Self-pay

## 2021-06-13 ENCOUNTER — Encounter: Payer: Self-pay | Admitting: Oncology

## 2021-06-13 ENCOUNTER — Encounter: Payer: Self-pay | Admitting: Pulmonary Disease

## 2021-06-13 MED ORDER — ATORVASTATIN CALCIUM 40 MG PO TABS
40.0000 mg | ORAL_TABLET | Freq: Every day | ORAL | 0 refills | Status: DC
  Filled 2021-06-13: qty 30, 30d supply, fill #0

## 2021-06-13 MED ORDER — ADULT MULTIVITAMIN W/MINERALS CH: 1.0000 | ORAL_TABLET | Freq: Every day | ORAL | Status: DC

## 2021-06-13 MED ORDER — LEFLUNOMIDE 20 MG PO TABS
10.0000 mg | ORAL_TABLET | Freq: Every day | ORAL | 0 refills | Status: DC
  Filled 2021-06-13: qty 30, 60d supply, fill #0

## 2021-06-13 MED ORDER — TAMSULOSIN HCL 0.4 MG PO CAPS
0.4000 mg | ORAL_CAPSULE | Freq: Every day | ORAL | 0 refills | Status: DC
  Filled 2021-06-13: qty 30, 30d supply, fill #0

## 2021-06-13 MED ORDER — VITAMIN D 25 MCG (1000 UNIT) PO TABS
1000.0000 [IU] | ORAL_TABLET | Freq: Every day | ORAL | 0 refills | Status: DC
Start: 2021-06-13 — End: 2021-11-24
  Filled 2021-06-13: qty 30, 30d supply, fill #0

## 2021-06-13 MED ORDER — PREDNISONE 1 MG PO TABS
3.0000 mg | ORAL_TABLET | Freq: Every day | ORAL | 0 refills | Status: DC
  Filled 2021-06-13: qty 30, 10d supply, fill #0

## 2021-06-13 MED ORDER — TRAMADOL HCL 50 MG PO TABS
25.0000 mg | ORAL_TABLET | Freq: Four times a day (QID) | ORAL | 0 refills | Status: DC | PRN
  Filled 2021-06-13: qty 14, 7d supply, fill #0

## 2021-06-13 MED ORDER — EZETIMIBE 10 MG PO TABS
10.0000 mg | ORAL_TABLET | Freq: Every day | ORAL | 0 refills | Status: DC
  Filled 2021-06-13: qty 30, 30d supply, fill #0

## 2021-06-13 MED ORDER — TORSEMIDE 20 MG PO TABS
40.0000 mg | ORAL_TABLET | Freq: Every day | ORAL | 0 refills | Status: DC
  Filled 2021-06-13: qty 60, 30d supply, fill #0

## 2021-06-13 MED ORDER — VITAMIN B-12 5000 MCG PO TBDP
5000.0000 ug | ORAL_TABLET | Freq: Every day | ORAL | 0 refills | Status: DC
  Filled 2021-06-13: qty 30, fill #0

## 2021-06-13 MED ORDER — AMIODARONE HCL 200 MG PO TABS
200.0000 mg | ORAL_TABLET | Freq: Every day | ORAL | 0 refills | Status: DC
  Filled 2021-06-13: qty 30, 30d supply, fill #0

## 2021-06-13 MED ORDER — LIDOCAINE 5 % EX PTCH
1.0000 | MEDICATED_PATCH | CUTANEOUS | 0 refills | Status: DC
  Filled 2021-06-13: qty 30, 30d supply, fill #0

## 2021-06-13 MED ORDER — GABAPENTIN 100 MG PO CAPS
100.0000 mg | ORAL_CAPSULE | ORAL | 0 refills | Status: DC
  Filled 2021-06-13: qty 90, 18d supply, fill #0

## 2021-06-13 MED ORDER — ACETAMINOPHEN 325 MG PO TABS: 650.0000 mg | ORAL_TABLET | ORAL | Status: AC | PRN

## 2021-06-13 MED ORDER — TORSEMIDE 20 MG PO TABS
40.0000 mg | ORAL_TABLET | Freq: Every day | ORAL | Status: DC
  Administered 2021-06-14: 40 mg via ORAL
  Filled 2021-06-13: qty 2

## 2021-06-13 MED ORDER — ISOSORBIDE MONONITRATE ER 60 MG PO TB24
60.0000 mg | ORAL_TABLET | Freq: Every day | ORAL | 0 refills | Status: DC
  Filled 2021-06-13: qty 30, 30d supply, fill #0

## 2021-06-13 MED ORDER — GALANTAMINE HYDROBROMIDE ER 8 MG PO CP24
8.0000 mg | ORAL_CAPSULE | Freq: Every day | ORAL | 0 refills | Status: DC
  Filled 2021-06-13: qty 30, 30d supply, fill #0

## 2021-06-13 MED ORDER — APIXABAN 2.5 MG PO TABS
2.5000 mg | ORAL_TABLET | Freq: Two times a day (BID) | ORAL | 0 refills | Status: DC
  Filled 2021-06-13: qty 60, 30d supply, fill #0

## 2021-06-13 MED ORDER — PANTOPRAZOLE SODIUM 40 MG PO TBEC
40.0000 mg | DELAYED_RELEASE_TABLET | ORAL | 0 refills | Status: DC
  Filled 2021-06-13: qty 15, 30d supply, fill #0

## 2021-06-13 MED ORDER — FINASTERIDE 5 MG PO TABS
5.0000 mg | ORAL_TABLET | Freq: Every day | ORAL | 0 refills | Status: DC
  Filled 2021-06-13: qty 30, 30d supply, fill #0

## 2021-06-13 MED ORDER — HYDRALAZINE HCL 25 MG PO TABS
25.0000 mg | ORAL_TABLET | Freq: Two times a day (BID) | ORAL | 0 refills | Status: DC
  Filled 2021-06-13: qty 60, 30d supply, fill #0

## 2021-06-13 MED ORDER — OMEGA-3 FATTY ACIDS 1000 MG PO CAPS
1.0000 g | ORAL_CAPSULE | Freq: Every day | ORAL | 0 refills | Status: DC
  Filled 2021-06-13: qty 30, 30d supply, fill #0

## 2021-06-13 MED ORDER — MAGNESIUM 250 MG PO TABS
250.0000 mg | ORAL_TABLET | Freq: Every day | ORAL | 0 refills | Status: DC
  Filled 2021-06-13: qty 30, 30d supply, fill #0

## 2021-06-13 MED ORDER — MEMANTINE HCL 10 MG PO TABS
10.0000 mg | ORAL_TABLET | Freq: Two times a day (BID) | ORAL | 0 refills | Status: DC
  Filled 2021-06-13: qty 60, 30d supply, fill #0

## 2021-06-13 MED ORDER — VITAMIN C 500 MG PO TABS
500.0000 mg | ORAL_TABLET | Freq: Every day | ORAL | 0 refills | Status: DC
Start: 2021-06-13 — End: 2021-11-24
  Filled 2021-06-13: qty 30, 30d supply, fill #0

## 2021-06-13 MED ORDER — TORSEMIDE 20 MG PO TABS
20.0000 mg | ORAL_TABLET | Freq: Once | ORAL | Status: AC
  Administered 2021-06-13: 20 mg via ORAL
  Filled 2021-06-13: qty 1

## 2021-06-13 MED ORDER — POTASSIUM CHLORIDE CRYS ER 10 MEQ PO TBCR
EXTENDED_RELEASE_TABLET | ORAL | 0 refills | Status: DC
  Filled 2021-06-13: qty 30, 30d supply, fill #0

## 2021-06-13 NOTE — Progress Notes (Signed)
Physical Therapy Discharge Summary  Patient Details  Name: [name] [name] MRN: [id number] Date of Birth: [birth year]   Patient has met 5 of 8 long term goals due to improved activity tolerance, improved balance, improved postural control, increased strength, and ability to compensate for deficits.  Patient to discharge at a wheelchair level [place].   Patient's care partner is independent to provide the necessary physical and cognitive assistance at discharge. Pt's spouse and adult children have completed hands-on family education.  Reasons goals not met: Pt did not meet bed mobility goal of Supervision, sit to stand goal of Supervision as he is still CGA for these, adequate for a safe d/c home. Pt also did not meet car transfer goal of CGA as he refused to perform transfer as simulation car was not difficult to maneuver in/out of and per pt report was not an accurate represtenation of the car he will d/c home in.  Recommendation:  Patient will benefit from ongoing skilled PT services in home health setting to continue to advance safe functional mobility, address ongoing impairments in endurance, strength, balance, safety, independence with functional mobility, and minimize fall risk.  Equipment: No equipment provided. Pt already owns all necessary equipment.  Reasons for discharge: treatment goals met and discharge from hospital  Patient/family agrees with progress made and goals achieved: Yes  PT Discharge Precautions/Restrictions Precautions Precautions: Fall;Other (comment) Precaution Comments: R hemiparesis, chronic LBP Restrictions Weight Bearing Restrictions: No Pain Pain Assessment Pain Scale: 0-10 Pain Score: 0-No pain Pain Interference Pain Interference Pain Effect on Sleep: 1. Rarely or not at all Pain Interference with Therapy Activities: 1. Rarely or not at all Pain Interference with Day-to-Day Activities: 1. Rarely or not at all Vision/Perception  Vision -  History Ability to See in Adequate Light: 0 Adequate Perception Perception: Within Functional Limits Praxis Praxis: Intact  Cognition Overall Cognitive Status: Within Functional Limits for tasks assessed Arousal/Alertness: Awake/alert Orientation Level: Oriented X4 Year: 2023 Attention: Focused;Sustained;Selective Focused Attention: Appears intact Sustained Attention: Appears intact Selective Attention: Appears intact Memory: Appears intact Awareness: Appears intact Problem Solving: Appears intact Safety/Judgment: Appears intact Sensation Sensation Light Touch: Impaired Detail Peripheral sensation comments: hx of peripheral neuropathy with decreased sensation in distal B LEs (limited access to L lower leg due to [employer]) Light Touch Impaired Details: Impaired LLE;Impaired RLE Hot/Cold: Appears Intact Proprioception: Impaired Detail Proprioception Impaired Details: Impaired RLE;Impaired LLE Stereognosis: Impaired Detail Stereognosis Impaired Details: Impaired RUE Additional Comments: mild R hemi with sensorimotor deficits, not always aware of that he is not fully grasping objects with R hand Coordination Gross Motor Movements are Fluid and Coordinated: No Fine Motor Movements are Fluid and Coordinated: No Coordination and Movement Description: mild R hemiparesis with generalized deconditioning limited by chronic LBP Finger Nose Finger Test: slower on R than L Motor  Motor Motor: Hemiplegia;Abnormal postural alignment and control Motor - Skilled Clinical Observations: mild R hemiparesis with generalized deconditioning and increased low back pain Motor - Discharge Observations: mild R hemiparesis with generalized deconditioning and increased low back pain, improved HP from eval  Mobility Bed Mobility Bed Mobility: Rolling Right;Rolling Left;Supine to Sit;Sit to Supine Rolling Right: Contact Guard/Touching assist Rolling Left: Contact Guard/Touching assist Supine to Sit:  Contact Guard/Touching assist Sit to Supine: Contact Guard/Touching assist Transfers Transfers: Sit to Stand;Stand to Sit;Stand Pivot Transfers;Squat Pivot Transfers Sit to Stand: Contact Guard/Touching assist Stand to Sit: Contact Guard/Touching assist Stand Pivot Transfers: Contact Guard/Touching assist Stand Pivot Transfer Details: Verbal cues for precautions/safety;Verbal cues  for technique;Tactile cues for posture;Tactile cues for weight shifting Squat Pivot Transfers: Supervision/Verbal cueing Transfer (Assistive device): Rolling walker Locomotion  Gait Ambulation: Yes Gait Assistance: 2 Helpers Gait Distance (Feet): 25 Feet Assistive device: Rolling walker Gait Gait: Yes Gait Pattern: Impaired Gait velocity: decreased Stairs / Additional Locomotion Stairs: No Pick up small object from the floor assist level: Minimal Assistance - Patient > 75% Pick up small object from the floor assistive device: RW and Art gallery manager Mobility: Yes Wheelchair Assistance: Independent with Camera operator: Both lower extermities Wheelchair Parts Management: Needs assistance Distance: 150  Trunk/Postural Assessment  Cervical Assessment Cervical Assessment: Exceptions to [facility] (forward head) Thoracic Assessment Thoracic Assessment: Exceptions to [facility] (thoracic kyphosis with rounded shoulders) Lumbar Assessment Lumbar Assessment: Exceptions to [facility] (posterior pelvic tilt in sitting) Postural Control Postural Control: Deficits on evaluation (mild posterior bias in standing) Righting Reactions: delayed and inadequate Protective Responses: delayed and inadequate Postural Limitations: impaired requiring B UE support and having posterior lean bias  Balance Balance Balance Assessed: Yes Static Sitting Balance Static Sitting - Balance Support: Feet supported Static Sitting - Level of Assistance: 6: Modified independent (Device/Increase time) Dynamic  Sitting Balance Dynamic Sitting - Balance Support: During functional activity Dynamic Sitting - Level of Assistance: 5: Stand by assistance Static Standing Balance Static Standing - Balance Support: Bilateral upper extremity supported;During functional activity Static Standing - Level of Assistance: 5: Stand by assistance Dynamic Standing Balance Dynamic Standing - Balance Support: During functional activity;Bilateral upper extremity supported Dynamic Standing - Level of Assistance: 4: Min assist;5: Stand by assistance Extremity Assessment  RUE Assessment RUE Assessment: Within Functional Limits General Strength Comments: 4+/5 throughout, grossly WFL for BADL RUE Body System: Neuro Brunstrum levels for arm and hand: Arm;Hand Brunstrum level for arm: Stage V Relative Independence from Synergy Brunstrum level for hand: Stage VI Isolated joint movements LUE Assessment LUE Assessment: Within Functional Limits RLE Assessment RLE Assessment: Exceptions to [facility] Passive Range of Motion (PROM) Comments: [name] RLE Strength Right Hip Flexion: 4/5 Right Knee Flexion: 4/5 Right Knee Extension: 4/5 Right Ankle Dorsiflexion: 1/5 Right Ankle Plantar Flexion: 3+/5 LLE Assessment LLE Assessment: Exceptions to [facility] LLE Strength Left Hip Flexion: 4/5 Left Knee Flexion: 4/5 Left Knee Extension: 4/5 Left Ankle Dorsiflexion: 2+/5 Left Ankle Plantar Flexion: 3+/5     Excell Seltzer, PT, DPT, CSRS 06/13/2021, 3:52 PM

## 2021-06-13 NOTE — Plan of Care (Signed)
  Problem: RH Balance Goal: LTG: Patient will maintain dynamic sitting balance (OT) Description: LTG:  Patient will maintain dynamic sitting balance with assistance during activities of daily living (OT) Outcome: Completed/Met Goal: LTG Patient will maintain dynamic standing with ADLs (OT) Description: LTG:  Patient will maintain dynamic standing balance with assist during activities of daily living (OT)  Outcome: Completed/Met   Problem: Sit to Stand Goal: LTG:  Patient will perform sit to stand in prep for activites of daily living with assistance level (OT) Description: LTG:  Patient will perform sit to stand in prep for activites of daily living with assistance level (OT) Outcome: Completed/Met   Problem: RH Eating Goal: LTG Patient will perform eating w/assist, cues/equip (OT) Description: LTG: Patient will perform eating with assist, with/without cues using equipment (OT) Outcome: Completed/Met   Problem: RH Grooming Goal: LTG Patient will perform grooming w/assist,cues/equip (OT) Description: LTG: Patient will perform grooming with assist, with/without cues using equipment (OT) Outcome: Completed/Met   Problem: RH Bathing Goal: LTG Patient will bathe all body parts with assist levels (OT) Description: LTG: Patient will bathe all body parts with assist levels (OT) Outcome: Completed/Met   Problem: RH Dressing Goal: LTG Patient will perform upper body dressing (OT) Description: LTG Patient will perform upper body dressing with assist, with/without cues (OT). Outcome: Completed/Met   Problem: RH Toileting Goal: LTG Patient will perform toileting task (3/3 steps) with assistance level (OT) Description: LTG: Patient will perform toileting task (3/3 steps) with assistance level (OT)  Outcome: Completed/Met   Problem: RH Functional Use of Upper Extremity Goal: LTG Patient will use RT/LT upper extremity as a (OT) Description: LTG: Patient will use right/left upper extremity as  a stabilizer/gross assist/diminished/nondominant/dominant level with assist, with/without cues during functional activity (OT) Outcome: Completed/Met   Problem: RH Pre-functional/Other (Specify) Goal: RH LTG OT (Specify) 1 Description: RH LTG OT (Specify) 1 Outcome: Completed/Met

## 2021-06-13 NOTE — Progress Notes (Signed)
Occupational Therapy Session Note  Patient Details  Name: [name] [name] MRN: [id number] Date of Birth: 03/10/1926  Today's Date: 06/13/2021 OT Individual Time: 7001-7494 OT Individual Time Calculation (min): 49 min    Short Term Goals: Week 1:  OT Short Term Goal 1 (Week 1): Pt will don shirt with S. OT Short Term Goal 1 - Progress (Week 1): Met OT Short Term Goal 2 (Week 1): Pt will complete toilet transfer with min A. OT Short Term Goal 2 - Progress (Week 1): Met OT Short Term Goal 3 (Week 1): Pt will complete bathing with mod A at sink. OT Short Term Goal 3 - Progress (Week 1): Met OT Short Term Goal 4 (Week 1): Pt will position his RUE when in chair/bed with no more than mod VCs. OT Short Term Goal 4 - Progress (Week 1): Met Week 2:  OT Short Term Goal 1 (Week 2): STG- LTGs d/t LOS  Skilled Therapeutic Interventions/Progress Updates:    Pt received seated in w/c with family present for family education, agreeable to therapy. Session focus on transfer etraining, activity tolerance, family education in prep for improved ADL/IADL/func mobility performance + decreased caregiver burden.  Total A w/c transport to and from gym for time management, energy conservation. Pt able to demonstrate modified stand-pivot toilet transfer with use of grab bars and 3in1 arm rests with overall CGA. Discussed that needs assist for thoroughness post BM for pericare and pulling up pants over R hip, discussed use of bidet or wet wipes to improve independence.  Pt completed step over shower transfer with use of RW and shower stool with CGA. Pt reports having grab bars in his own shower.  Finally, pt completed squat-pivot with use of LUE on bed rail w/c <> bed. Daughter reports feeling uncomfortable with transfers at this time, reviewed safe falls techniques and use of grab bars. Pt declined additional transfer training at this time due to increase in LBP.   Issued RUE NMR HEP and yellow theraputty with the  following therex:   - putty squeeze, pincer grasp, rolling into log shape, key pinch  -red theraband biceps curls, forward punch, shoulder horizontal abduction, shoulder forward flexion, and overhead shoulder raises  -CVA stroke support group flyer with Be Fast acronym  Pt/family with confusion about DC date, believed it to be 6/15 as written on whiteboard. Confirmed with SW that chart has 6/14 DC date, family confirmed that they will be able to transport pt home tomorrow.  Pt left seated in w/c with family present awaiting following PT appointment with call bell in reach, and all immediate needs met.    Therapy Documentation Precautions:  Precautions Precautions: Fall, Other (comment) Precaution Comments: R hemiparesis, chronic LBP Restrictions Weight Bearing Restrictions: No  Pain:  Chronic LBP, did not rate, treatment to tolerance ADL: See Care Tool for more details.   Therapy/Group: Individual Therapy  Volanda Napoleon MS, OTR/L  06/13/2021, 6:53 AM

## 2021-06-13 NOTE — Plan of Care (Signed)
  Problem: RH Expression Communication Goal: LTG Patient will verbally express basic/complex needs(SLP) Description: LTG:  Patient will verbally express basic/complex needs, wants or ideas with cues  (SLP) Outcome: Completed/Met Goal: LTG Patient will increase speech intelligibility (SLP) Description: LTG: Patient will increase speech intelligibility at word/phrase/conversation level with cues, % of the time (SLP) Outcome: Completed/Met   Problem: RH Expression Communication Goal: LTG Patient will increase word finding of common (SLP) Description: LTG:  Patient will increase word finding of common objects/daily info/abstract thoughts with cues using compensatory strategies (SLP). Outcome: Completed/Met   Problem: RH Problem Solving Goal: LTG Patient will demonstrate problem solving for (SLP) Description: LTG:  Patient will demonstrate problem solving for basic/complex daily situations with cues  (SLP) Outcome: Completed/Met   Problem: RH Attention Goal: LTG Patient will demonstrate this level of attention during functional activites (SLP) Description: LTG:  Patient will will demonstrate this level of attention during functional activites (SLP) Outcome: Completed/Met

## 2021-06-13 NOTE — Progress Notes (Signed)
Speech Language Pathology Daily Session Note  Patient Details  Name: [name] [name] MRN: [id number] Date of Birth: 29-Nov-1926  Today's Date: 06/13/2021 SLP Individual Time: 1300-1345 SLP Individual Time Calculation (min): 45 min  Short Term Goals: Week 2: SLP Short Term Goal 1 (Week 2): STG=LTG due to short ELOS (6/15)  Skilled Therapeutic Interventions: Skilled ST treatment focused on pt/family education. SLP facilitated session by educating family members on cognitive-communication, language, and motor speech deficits. SLP emphasized compensatory strategies and various adaptations for speech/writing and cognition to improve attention/memory/problem solving skills and efficiency. SLP provided pt/family with handout containing word finding strategies. SLP also reinforced recommendations for follow up therapy in home health setting. All questions were addressed and family verbalized understanding through teach back. Pt was passed off to OT at end of session.   Pain Pain Assessment Pain Scale: 0-10 Pain Score: 0-No pain  Therapy/Group: Individual Therapy  [name] 06/13/2021, 1:53 PM

## 2021-06-13 NOTE — Progress Notes (Signed)
Patient is currently resting with his CPAP that he brought from home.

## 2021-06-13 NOTE — Progress Notes (Signed)
Speech Language Pathology Discharge Summary  Patient Details  Name: [name] [name] MRN: [id number] Date of Birth: [birth year]  Patient has met 5 of 5 long term goals.  Patient to discharge at overall Supervision;Modified Independent level.  Reasons goals not met: All goals met   Clinical Impression/Discharge Summary: Patient has made excellent gains and has met 5 of 5 long-term goals this admission due to improved motor speech, word finding skills, complex problem solving, and attention. Pt is completing high level cognitive-linguistic tasks with mod I-to-sup A in regards to problem solving, attention, and functional recall. Pt demonstrates improved self-monitoring and self correction of verbal errors when communicating at the phrase/sentence level. Pt continues to exhibit mild higher level word finding, spelling, motor speech production, and alternating attention deficits. Patient and family education is complete and patient to discharge at overall mod I-to-sup A level. Patient's care partner is independent to provide the necessary physical and cognitive assistance at discharge. Pt would continue to benefit from skilled ST services in order to maximize functional independence and reduce burden of care, with continued skilled ST services at discharge.   Care Partner:  Caregiver Able to Provide Assistance: Yes  Type of Caregiver Assistance: Physical;Cognitive  Recommendation:  Home Health SLP  Rationale for SLP Follow Up: Maximize cognitive function and independence;Maximize functional communication;Reduce caregiver burden   Equipment: None provided or recommended   Reasons for discharge: Treatment goals met;Discharged from hospital   Patient/Family Agrees with Progress Made and Goals Achieved: Yes    [name] 06/13/2021, 4:49 PM

## 2021-06-13 NOTE — Progress Notes (Signed)
Occupational Therapy Session Note  Patient Details  Name: [name] [name] MRN: [id number] Date of Birth: Sep 10, 1926  Today's Date: 06/13/2021 OT Individual Time: 1005-1059 OT Individual Time Calculation (min): 54 min    Short Term Goals: Week 2:  OT Short Term Goal 1 (Week 2): STG- LTGs d/t LOS  Skilled Therapeutic Interventions/Progress Updates:    Patient received awake and supine in bed.  Eager to get up, clean teeth and take shower.  Patient with report of back pain with mobiliy - ranging from 4-6/10 - called nursing for pain medication as patient could not remember having taken it this morning.  Nursing delivered medication while patient in shower.  Patient with chronic back pain.  Patient transferred via squat pivot to wheelchair, then same to shower bench.  Patient able to bathe self with exception of lower legs - in protective wrapping (wound care)  Patient needed assistance to transfer out of shower (minimal) and assistance to pull up clothing in standing.  Patient reports having reacher at home, but opted to not practice with one this session.  Patient reports pain from medication resolving at end of session.   Patient left with safety belt in place and engaged and call bell/personal items within reach.    Therapy Documentation Precautions:  Precautions Precautions: Fall, Other (comment) Precaution Comments: R hemiparesis, chronic LBP Restrictions Weight Bearing Restrictions: No  Pain: Pain Assessment Pain Scale: 0-10 Pain Score: 5  Pain Type: Chronic pain Pain Location: Back Pain Orientation: Lower     Therapy/Group: Individual Therapy  Mariah Milling 06/13/2021, 12:52 PM

## 2021-06-13 NOTE — Progress Notes (Signed)
[name] [name], ARMSTRONG I. ([id number]) Visit Report for 05/10/2021 Arrival Information Details Patient Name: Date of Service: [name] [name] ID I. 05/10/2021 1:30 PM Medical Record Number: [id number] Patient Account Number: [id number] Date of Birth/Sex: Treating RN: 1926/06/09 (88 y.o. [name] [name] Primary Care Enslie Sahota: [name] Other Clinician: Referring Kahlen Boyde: Treating Marguriete Wootan/Extender: Eli Hose in Treatment: 70 Visit Information History Since Last Visit Added or deleted any medications: No Patient Arrived: Wheel Chair Any new allergies or adverse reactions: No Arrival Time: 13:23 Had a fall or experienced change in Yes Accompanied By: daughter activities of daily living that may affect Transfer Assistance: Manual risk of falls: Patient Identification Verified: Yes Signs or symptoms of abuse/neglect since last visito No Secondary Verification Process Completed: Yes Hospitalized since last visit: No Patient Requires Transmission-Based Precautions: No Implantable device outside of the clinic excluding No Patient Has Alerts: Yes cellular tissue based products placed in the center Patient Alerts: Patient on Blood Thinner since last visit: Has Dressing in Place as Prescribed: No Has Compression in Place as Prescribed: No Pain Present Now: Yes Electronic Signature(s) Signed: 06/13/2021 8:46:54 AM By: Erenest Blank Entered By: Erenest Blank on 05/10/2021 13:24:59 -------------------------------------------------------------------------------- Clinic Level of Care Assessment Details Patient Name: Date of Service: [name] [name] ID I. 05/10/2021 1:30 PM Medical Record Number: [id number] Patient Account Number: [id number] Date of Birth/Sex: Treating RN: 1926-01-20 (88 y.o. [name] [name] Primary Care Sathvik Tiedt: [name] Other Clinician: Referring Seven Dollens: Treating Yetunde Leis/Extender: Eli Hose in Treatment: 53 Clinic  Level of Care Assessment Items TOOL 4 Quantity Score X- 1 0 Use when only an EandM is performed on FOLLOW-UP visit ASSESSMENTS - Nursing Assessment / Reassessment X- 1 10 Reassessment of Co-morbidities (includes updates in patient status) X- 1 5 Reassessment of Adherence to Treatment Plan ASSESSMENTS - Wound and Skin A ssessment / Reassessment '[]'$  - 0 Simple Wound Assessment / Reassessment - one wound X- 7 5 Complex Wound Assessment / Reassessment - multiple wounds X- 1 10 Dermatologic / Skin Assessment (not related to wound area) ASSESSMENTS - Focused Assessment X- 1 5 Circumferential Edema Measurements - multi extremities X- 1 10 Nutritional Assessment / Counseling / Intervention '[]'$  - 0 Lower Extremity Assessment (monofilament, tuning fork, pulses) '[]'$  - 0 Peripheral Arterial Disease Assessment (using hand held doppler) ASSESSMENTS - Ostomy and/or Continence Assessment and Care '[]'$  - 0 Incontinence Assessment and Management '[]'$  - 0 Ostomy Care Assessment and Management (repouching, etc.) PROCESS - Coordination of Care '[]'$  - 0 Simple Patient / Family Education for ongoing care X- 1 20 Complex (extensive) Patient / Family Education for ongoing care X- 1 10 Staff obtains Programmer, systems, Records, T Results / Process Orders est '[]'$  - 0 Staff telephones HHA, Nursing Homes / Clarify orders / etc '[]'$  - 0 Routine Transfer to another Facility (non-emergent condition) '[]'$  - 0 Routine Hospital Admission (non-emergent condition) '[]'$  - 0 New Admissions / Biomedical engineer / Ordering NPWT Apligraf, etc. , '[]'$  - 0 Emergency Hospital Admission (emergent condition) '[]'$  - 0 Simple Discharge Coordination X- 1 15 Complex (extensive) Discharge Coordination PROCESS - Special Needs '[]'$  - 0 Pediatric / Minor Patient Management '[]'$  - 0 Isolation Patient Management '[]'$  - 0 Hearing / Language / Visual special needs '[]'$  - 0 Assessment of Community assistance (transportation, D/C planning,  etc.) '[]'$  - 0 Additional assistance / Altered mentation '[]'$  - 0 Support Surface(s) Assessment (bed, cushion, seat, etc.) INTERVENTIONS - Wound Cleansing /  Measurement '[]'$  - 0 Simple Wound Cleansing - one wound X- 7 5 Complex Wound Cleansing - multiple wounds X- 1 5 Wound Imaging (photographs - any number of wounds) '[]'$  - 0 Wound Tracing (instead of photographs) '[]'$  - 0 Simple Wound Measurement - one wound X- 7 5 Complex Wound Measurement - multiple wounds INTERVENTIONS - Wound Dressings X - Small Wound Dressing one or multiple wounds 2 10 X- 2 15 Medium Wound Dressing one or multiple wounds '[]'$  - 0 Large Wound Dressing one or multiple wounds '[]'$  - 0 Application of Medications - topical '[]'$  - 0 Application of Medications - injection INTERVENTIONS - Miscellaneous '[]'$  - 0 External ear exam '[]'$  - 0 Specimen Collection (cultures, biopsies, blood, body fluids, etc.) '[]'$  - 0 Specimen(s) / Culture(s) sent or taken to Lab for analysis '[]'$  - 0 Patient Transfer (multiple staff / Civil Service fast streamer / Similar devices) '[]'$  - 0 Simple Staple / Suture removal (25 or less) '[]'$  - 0 Complex Staple / Suture removal (26 or more) '[]'$  - 0 Hypo / Hyperglycemic Management (close monitor of Blood Glucose) '[]'$  - 0 Ankle / Brachial Index (ABI) - do not check if billed separately X- 1 5 Vital Signs Has the patient been seen at the hospital within the last three years: Yes Total Score: 250 Level Of Care: New/Established - Level 5 Electronic Signature(s) Signed: 05/10/2021 6:15:37 PM By: Deon Pilling RN, BSN Entered By: Deon Pilling on 05/10/2021 14:23:03 -------------------------------------------------------------------------------- Encounter Discharge Information Details Patient Name: Date of Service: [name] [name], [name] 05/10/2021 1:30 PM Medical Record Number: [id number] Patient Account Number: [id number] Date of Birth/Sex: Treating RN: 19-May-1926 (88 y.o. [name] [name] Primary Care Korbyn Vanes: [name]  Other Clinician: Referring Kemia Wendel: Treating Kylo Gavin/Extender: Eli Hose in Treatment: 63 Encounter Discharge Information Items Discharge Condition: Stable Ambulatory Status: Wheelchair Discharge Destination: Home Transportation: Private Auto Accompanied By: daughter Schedule Follow-up Appointment: Yes Clinical Summary of Care: Electronic Signature(s) Signed: 05/10/2021 6:15:37 PM By: Deon Pilling RN, BSN Entered By: Deon Pilling on 05/10/2021 14:23:43 -------------------------------------------------------------------------------- Lower Extremity Assessment Details Patient Name: Date of Service: [name], [name] 05/10/2021 1:30 PM Medical Record Number: [id number] Patient Account Number: [id number] Date of Birth/Sex: Treating RN: Feb 08, 1926 (88 y.o. [name] [name] Primary Care Jalyiah Shelley: [name] Other Clinician: Referring Merced Hanners: Treating Mychelle Kendra/Extender: [name] in Treatment: 33 Edema Assessment Assessed: [Left: No] [Right: No] Edema: [Left: Yes] [Right: Yes] Calf Left: Right: Point of Measurement: 34 cm From Medial Instep 41.7 cm 43.5 cm Ankle Left: Right: Point of Measurement: 11 cm From Medial Instep 27 cm 30 cm Vascular Assessment Pulses: Dorsalis Pedis Palpable: [Left:Yes] [Right:Yes] Electronic Signature(s) Signed: 05/10/2021 6:15:37 PM By: Deon Pilling RN, BSN Signed: 06/13/2021 8:46:54 AM By: Erenest Blank Entered By: Erenest Blank on 05/10/2021 13:42:02 -------------------------------------------------------------------------------- Multi-Disciplinary Care Plan Details Patient Name: Date of Service: [name] [name], [name] 05/10/2021 1:30 PM Medical Record Number: [id number] Patient Account Number: [id number] Date of Birth/Sex: Treating RN: Aug 18, 1926 (88 y.o. [name] [name] Primary Care Alver Leete: [name] Other Clinician: Referring Dustyn Armbrister: Treating Marlan Steward/Extender: Eli Hose in Treatment: 20 Active Inactive Abuse / Safety / Falls / Self Care Management Nursing Diagnoses: Potential for falls Goals: Patient will remain injury free related to falls Date Initiated: 03/22/2021 Target Resolution Date: 06/02/2021 Goal Status: Active Interventions: Assess: immobility, friction, shearing, incontinence upon admission and as needed Assess impairment of  mobility on admission and as needed per policy Assess self care needs on admission and as needed Provide education on fall prevention Notes: Nutrition Nursing Diagnoses: Potential for alteratiion in Nutrition/Potential for imbalanced nutrition Goals: Patient/caregiver agrees to and verbalizes understanding of need to obtain nutritional consultation Date Initiated: 03/22/2021 T arget Resolution Date: 06/16/2021 Goal Status: Active Interventions: Provide education on nutrition Treatment Activities: Education provided on Nutrition : 03/22/2021 Giving encouragement to exercise : 03/22/2021 Notes: Electronic Signature(s) Signed: 05/10/2021 6:15:37 PM By: Deon Pilling RN, BSN Entered By: Deon Pilling on 05/10/2021 13:51:13 -------------------------------------------------------------------------------- Pain Assessment Details Patient Name: Date of Service: [name] [name], [name] 05/10/2021 1:30 PM Medical Record Number: [id number] Patient Account Number: [id number] Date of Birth/Sex: Treating RN: 11-25-1926 (88 y.o. [name] [name] Primary Care Aoife Bold: [name] Other Clinician: Referring Keiley Levey: Treating Adrean Heitz/Extender: Eli Hose in Treatment: 33 Active Problems Location of Pain Severity and Description of Pain Patient Has Paino Yes Site Locations Pain Location: Generalized Pain Rate the pain. Current Pain Level: 3 Pain Management and Medication Current Pain Management: Electronic Signature(s) Signed: 05/10/2021 6:15:37 PM By: Deon Pilling RN,  BSN Signed: 06/13/2021 8:46:54 AM By: Erenest Blank Entered By: Erenest Blank on 05/10/2021 13:26:16 -------------------------------------------------------------------------------- Patient/Caregiver Education Details Patient Name: Date of Service: [name] [name] ID I. 5/10/2023andnbsp1:30 PM Medical Record Number: [id number] Patient Account Number: [id number] Date of Birth/Gender: Treating RN: [birth year] (88 y.o. [name] [name] Primary Care Physician: [name] Other Clinician: Referring Physician: Treating Physician/Extender: Eli Hose in Treatment: 54 Education Assessment Education Provided To: Patient Education Topics Provided Nutrition: Handouts: Nutrition Methods: Explain/Verbal Responses: Reinforcements needed Electronic Signature(s) Signed: 05/10/2021 6:15:37 PM By: Deon Pilling RN, BSN Entered By: Deon Pilling on 05/10/2021 13:51:25 -------------------------------------------------------------------------------- Wound Assessment Details Patient Name: Date of Service: [name] [name], [name] 05/10/2021 1:30 PM Medical Record Number: [id number] Patient Account Number: [id number] Date of Birth/Sex: Treating RN: 05/20/1926 (88 y.o. [name], [name] Primary Care Zabella Wease: [name] Other Clinician: Referring Evett Kassa: Treating Jumaane Weatherford/Extender: Eli Hose in Treatment: 33 Wound Status Wound Number: 1 Primary Pressure Ulcer Etiology: Wound Location: Right, Lateral Calcaneus Wound Healed - Epithelialized Wounding Event: Pressure Injury Status: Date Acquired: 06/08/2020 Comorbid Cataracts, Sleep Apnea, Arrhythmia, Congestive Heart Failure, Weeks Of Treatment: 33 History: Coronary Artery Disease, Hypertension, Myocardial Infarction, Clustered Wound: No End Stage Renal Disease, Gout, Dementia, Neuropathy Photos Photo Uploaded By: Donavan Burnet on 05/10/2021 17:17:00 Wound Measurements Length:  (cm) Width: (cm) Depth: (cm) Area: (cm) Volume: (cm) 0 % Reduction in Area: 100% 0 % Reduction in Volume: 100% 0 Epithelialization: Large (67-100%) 0 Tunneling: No 0 Undermining: No Wound Description Classification: Category/Stage III Wound Margin: Distinct, outline attached Exudate Amount: None Present Foul Odor After Cleansing: No Slough/Fibrino No Wound Bed Granulation Amount: None Present (0%) Exposed Structure Necrotic Amount: None Present (0%) Fascia Exposed: No Fat Layer (Subcutaneous Tissue) Exposed: Yes Tendon Exposed: No Muscle Exposed: No Joint Exposed: No Bone Exposed: No Treatment Notes Wound #1 (Calcaneus) Wound Laterality: Right, Lateral Cleanser Peri-Wound Care Topical Primary Dressing Secondary Dressing Secured With Compression Wrap Compression Stockings Add-Ons Electronic Signature(s) Signed: 05/10/2021 6:15:37 PM By: Deon Pilling RN, BSN Entered By: Deon Pilling on 05/10/2021 14:19:32 -------------------------------------------------------------------------------- Wound Assessment Details Patient Name: Date of Service: [name] [name], [name] 05/10/2021 1:30 PM Medical Record Number: [id number] Patient Account Number: [id number] Date of Birth/Sex: Treating RN: 1926/11/11 (88 y.o. M) [name],  [name] Primary Care Alyssha Housh: [name] Other Clinician: Referring Shalisha Clausing: Treating Regenia Erck/Extender: Eli Hose in Treatment: 33 Wound Status Wound Number: 10 Primary Trauma, Other Etiology: Wound Location: Right, Anterior Upper Leg Wound Open Wounding Event: Trauma Status: Date Acquired: 04/29/2021 Comorbid Cataracts, Sleep Apnea, Arrhythmia, Congestive Heart Failure, Weeks Of Treatment: 1 History: Coronary Artery Disease, Hypertension, Myocardial Infarction, Clustered Wound: No End Stage Renal Disease, Gout, Dementia, Neuropathy Photos Photo Uploaded By: Donavan Burnet on 05/10/2021 17:17:00 Wound  Measurements Length: (cm) 9.5 Width: (cm) 7.5 Depth: (cm) 0.1 Area: (cm) 55.96 Volume: (cm) 5.596 % Reduction in Area: 13.8% % Reduction in Volume: 13.8% Epithelialization: None Tunneling: No Undermining: No Wound Description Classification: Full Thickness Without Exposed Support Structures Wound Margin: Flat and Intact Exudate Amount: Large Exudate Type: Serosanguineous Exudate Color: red, brown Foul Odor After Cleansing: No Slough/Fibrino Yes Wound Bed Granulation Amount: None Present (0%) Exposed Structure Necrotic Amount: Large (67-100%) Fascia Exposed: No Necrotic Quality: Eschar, Adherent Slough Fat Layer (Subcutaneous Tissue) Exposed: Yes Tendon Exposed: No Muscle Exposed: No Joint Exposed: No Bone Exposed: No Electronic Signature(s) Signed: 05/10/2021 6:15:37 PM By: Deon Pilling RN, BSN Signed: 06/13/2021 8:46:54 AM By: Erenest Blank Entered By: Erenest Blank on 05/10/2021 13:49:34 -------------------------------------------------------------------------------- Wound Assessment Details Patient Name: Date of Service: [name] [name], [name] 05/10/2021 1:30 PM Medical Record Number: [id number] Patient Account Number: [id number] Date of Birth/Sex: Treating RN: 03-05-1926 (88 y.o. [name], [name] Primary Care Kiri Hinderliter: [name] Other Clinician: Referring Maninder Deboer: Treating Kashon Kraynak/Extender: Eli Hose in Treatment: 33 Wound Status Wound Number: 11 Primary Trauma, Other Etiology: Wound Location: Right, Posterior Upper Arm Secondary Abrasion Wounding Event: Trauma Etiology: Date Acquired: 04/29/2021 Wound Open Weeks Of Treatment: 1 Status: Clustered Wound: No Comorbid Cataracts, Sleep Apnea, Arrhythmia, Congestive Heart Failure, History: Coronary Artery Disease, Hypertension, Myocardial Infarction, End Stage Renal Disease, Gout, Dementia, Neuropathy Photos Photo Uploaded By: Donavan Burnet on 05/10/2021 17:17:00 Wound  Measurements Length: (cm) Width: (cm) Depth: (cm) Area: (cm) Volume: (cm) 0 % Reduction in Area: 100% 0 % Reduction in Volume: 100% 0 Epithelialization: Large (67-100%) 0 Tunneling: No 0 Undermining: No Wound Description Classification: Full Thickness Without Exposed Support Structures Wound Margin: Flat and Intact Exudate Amount: None Present Foul Odor After Cleansing: No Slough/Fibrino No Wound Bed Granulation Amount: None Present (0%) Exposed Structure Necrotic Amount: None Present (0%) Fascia Exposed: No Fat Layer (Subcutaneous Tissue) Exposed: Yes Tendon Exposed: No Muscle Exposed: No Joint Exposed: No Bone Exposed: No Electronic Signature(s) Signed: 05/10/2021 6:15:37 PM By: Deon Pilling RN, BSN Signed: 06/13/2021 8:46:54 AM By: Erenest Blank Entered By: Erenest Blank on 05/10/2021 13:50:08 -------------------------------------------------------------------------------- Wound Assessment Details Patient Name: Date of Service: [name] [name], [name] 05/10/2021 1:30 PM Medical Record Number: [id number] Patient Account Number: [id number] Date of Birth/Sex: Treating RN: 05-Nov-1926 (88 y.o. [name], [name] Primary Care Emmakate Hypes: [name] Other Clinician: Referring Ferrell Flam: Treating Chaseton Yepiz/Extender: Eli Hose in Treatment: 33 Wound Status Wound Number: 12 Primary Skin T ear Etiology: Wound Location: Left Forearm Wound Open Wounding Event: Skin Tear/Laceration Status: Date Acquired: 05/06/2021 Comorbid Cataracts, Sleep Apnea, Arrhythmia, Congestive Heart Failure, Weeks Of Treatment: 0 History: Coronary Artery Disease, Hypertension, Myocardial Infarction, Clustered Wound: No End Stage Renal Disease, Gout, Dementia, Neuropathy Wound Measurements Length: (cm) 4.5 Width: (cm) 1 Depth: (cm) 0.1 Area: (cm) 3.534 Volume: (cm) 0.353 % Reduction in Area: % Reduction in Volume: Epithelialization: None Tunneling: No Undermining:  No Wound Description Classification: Full Thickness Without Exposed Support Structures Wound Margin: Distinct, outline attached Exudate Amount: Medium Exudate Type: Serosanguineous Exudate Color: red, brown Foul Odor After Cleansing: No Slough/Fibrino No Wound Bed Granulation Amount: Large (67-100%) Exposed Structure Granulation Quality: Red, Pink, Pale Fascia Exposed: No Necrotic Amount: None Present (0%) Fat Layer (Subcutaneous Tissue) Exposed: Yes Tendon Exposed: No Muscle Exposed: No Joint Exposed: No Bone Exposed: No Electronic Signature(s) Signed: 05/10/2021 6:15:37 PM By: Deon Pilling RN, BSN Entered By: Deon Pilling on 05/10/2021 13:50:57 -------------------------------------------------------------------------------- Wound Assessment Details Patient Name: Date of Service: [name] [name], [name] 05/10/2021 1:30 PM Medical Record Number: [id number] Patient Account Number: [id number] Date of Birth/Sex: Treating RN: 10-07-1926 (88 y.o. [name], [name] Primary Care Levelle Edelen: Other Clinician: [name] Referring Kiptyn Rafuse: Treating Shandrell Boda/Extender: Eli Hose in Treatment: 33 Wound Status Wound Number: 3 Primary Venous Leg Ulcer Etiology: Wound Location: Left, Lateral Ankle Wound Open Wounding Event: Gradually Appeared Status: Date Acquired: 10/12/2020 Comorbid Cataracts, Sleep Apnea, Arrhythmia, Congestive Heart Failure, Weeks Of Treatment: 25 History: Coronary Artery Disease, Hypertension, Myocardial Infarction, Clustered Wound: No End Stage Renal Disease, Gout, Dementia, Neuropathy Photos Photo Uploaded By: Donavan Burnet on 05/10/2021 17:17:30 Wound Measurements Length: (cm) 1.7 Width: (cm) 1.5 Depth: (cm) 0.2 Area: (cm) 2.003 Volume: (cm) 0.401 % Reduction in Area: -298.2% % Reduction in Volume: -165.6% Epithelialization: Small (1-33%) Tunneling: No Undermining: No Wound Description Classification: Full Thickness  Without Exposed Support Structures Wound Margin: Distinct, outline attached Exudate Amount: Medium Exudate Type: Serosanguineous Exudate Color: red, brown Foul Odor After Cleansing: No Slough/Fibrino Yes Wound Bed Granulation Amount: Large (67-100%) Exposed Structure Granulation Quality: Red Fascia Exposed: No Necrotic Amount: Small (1-33%) Fat Layer (Subcutaneous Tissue) Exposed: Yes Necrotic Quality: Adherent Slough Tendon Exposed: No Muscle Exposed: No Joint Exposed: No Bone Exposed: No Electronic Signature(s) Signed: 05/10/2021 6:15:37 PM By: Deon Pilling RN, BSN Signed: 06/13/2021 8:46:54 AM By: Erenest Blank Entered By: Erenest Blank on 05/10/2021 13:51:26 -------------------------------------------------------------------------------- Wound Assessment Details Patient Name: Date of Service: [name] [name], [name] 05/10/2021 1:30 PM Medical Record Number: [id number] Patient Account Number: [id number] Date of Birth/Sex: Treating RN: 09/16/1926 (88 y.o. [name], [name] Primary Care Icess Bertoni: [name] Other Clinician: Referring Jamila Slatten: Treating Kailash Hinze/Extender: Eli Hose in Treatment: 33 Wound Status Wound Number: 6 Primary Arterial Insufficiency Ulcer Etiology: Wound Location: Right, Lateral Ankle Wound Open Wounding Event: Gradually Appeared Status: Date Acquired: 11/22/2020 Comorbid Cataracts, Sleep Apnea, Arrhythmia, Congestive Heart Failure, Weeks Of Treatment: 20 History: Coronary Artery Disease, Hypertension, Myocardial Infarction, Clustered Wound: No End Stage Renal Disease, Gout, Dementia, Neuropathy Photos Photo Uploaded By: Donavan Burnet on 05/10/2021 17:17:31 Wound Measurements Length: (cm) 1.5 Width: (cm) 0.9 Depth: (cm) 0.6 Area: (cm) 1.06 Volume: (cm) 0.636 % Reduction in Area: -285.5% % Reduction in Volume: -364.2% Epithelialization: Small (1-33%) Tunneling: No Undermining: No Wound  Description Classification: Full Thickness Without Exposed Support Structures Wound Margin: Distinct, outline attached Exudate Amount: Medium Exudate Type: Serosanguineous Exudate Color: red, brown Foul Odor After Cleansing: No Slough/Fibrino Yes Wound Bed Granulation Amount: Small (1-33%) Exposed Structure Granulation Quality: Pink Fascia Exposed: No Necrotic Amount: Large (67-100%) Fat Layer (Subcutaneous Tissue) Exposed: Yes Necrotic Quality: Adherent Slough Tendon Exposed: No Muscle Exposed: No Joint Exposed: No Bone Exposed: No Electronic Signature(s) Signed: 05/10/2021 6:15:37 PM By: Deon Pilling RN, BSN Signed: 06/13/2021 8:46:54 AM By: Erenest Blank Entered By: Erenest Blank on 05/10/2021 13:52:08 -------------------------------------------------------------------------------- Wound Assessment Details Patient  Name: Date of Service: [name] [name] ID I. 05/10/2021 1:30 PM Medical Record Number: [id number] Patient Account Number: [id number] Date of Birth/Sex: Treating RN: December 20, 1926 (88 y.o. [name], [name] Primary Care Genevieve Arbaugh: [name] Other Clinician: Referring Daeveon Zweber: Treating Naylah Cork/Extender: Eli Hose in Treatment: 33 Wound Status Wound Number: 9 Primary Skin T ear Etiology: Wound Location: Left, Medial Lower Leg Secondary Venous Leg Ulcer Wounding Event: Skin Tear/Laceration Etiology: Date Acquired: 04/27/2021 Date Acquired: 04/27/2021 Wound Healed - Epithelialized Weeks Of Treatment: 1 Status: Clustered Wound: No Comorbid Cataracts, Sleep Apnea, Arrhythmia, Congestive Heart Failure, History: Coronary Artery Disease, Hypertension, Myocardial Infarction, End Stage Renal Disease, Gout, Dementia, Neuropathy Photos Photo Uploaded By: Donavan Burnet on 05/10/2021 17:17:31 Wound Measurements Length: (cm) Width: (cm) Depth: (cm) Area: (cm) Volume: (cm) 0 % Reduction in Area: 100% 0 % Reduction in Volume: 100% 0  Epithelialization: None 0 Tunneling: No 0 Undermining: No Wound Description Classification: Full Thickness Without Exposed Support Structures Wound Margin: Flat and Intact Exudate Amount: Medium Exudate Type: Serosanguineous Exudate Color: red, brown Foul Odor After Cleansing: No Slough/Fibrino Yes Wound Bed Granulation Amount: Large (67-100%) Exposed Structure Granulation Quality: Red Fascia Exposed: No Necrotic Amount: None Present (0%) Fat Layer (Subcutaneous Tissue) Exposed: Yes Tendon Exposed: No Muscle Exposed: No Joint Exposed: No Bone Exposed: No Treatment Notes Wound #9 (Lower Leg) Wound Laterality: Left, Medial Cleanser Peri-Wound Care Topical Primary Dressing Secondary Dressing Secured With Compression Wrap Compression Stockings Add-Ons Electronic Signature(s) Signed: 05/10/2021 6:15:37 PM By: Deon Pilling RN, BSN Entered By: Deon Pilling on 05/10/2021 14:18:04 -------------------------------------------------------------------------------- Vitals Details Patient Name: Date of Service: [name] [name], [name] 05/10/2021 1:30 PM Medical Record Number: [id number] Patient Account Number: [id number] Date of Birth/Sex: Treating RN: [birth year] (88 y.o. [name], [name] Primary Care Kenyata Napier: [name] Other Clinician: Referring Jaana Brodt: Treating Sukhraj Esquivias/Extender: Eli Hose in Treatment: 67 Vital Signs Time Taken: 13:25 Temperature (F): 97.8 Height (in): 72 Pulse (bpm): 59 Weight (lbs): 223 Respiratory Rate (breaths/min): 19 Body Mass Index (BMI): 30.2 Blood Pressure (mmHg): 134/64 Reference Range: 80 - 120 mg / dl Electronic Signature(s) Signed: 06/13/2021 8:46:54 AM By: Erenest Blank Entered By: Erenest Blank on 05/10/2021 13:25:32

## 2021-06-13 NOTE — Progress Notes (Signed)
Physical Therapy Session Note  Patient Details  Name: [name] [name] MRN: [id number] Date of Birth: Dec 27, 1926  Today's Date: 06/13/2021 PT Individual Time: 1445-1525 PT Individual Time Calculation (min): 40 min   Short Term Goals: Week 2:  PT Short Term Goal 1 (Week 2): = to LTGs based on ELOS  Skilled Therapeutic Interventions/Progress Updates:    Pt received seated in w/c in room. Pt's wife, 2 adult daughters, and son in law present for family education session. Pt's family reports already practicing hands-on transfers during OT session. Offered to demonstrate bed mobility, car transfers, etc with patient and family. Pt and family decline any further hands-on training at this time, requesting HEP. Provided HEP for patient, see below. Reviewed exercises with patient and family. Discussed follow-up therapy (HHPT), safety in the home, recommendations of 24/7 assist, etc. Pt and family with no further questions. Pt left seated in w/c in room with needs in reach, family present.  Access Code: [email or web address] URL: [email or web address] Date: 06/13/2021 Prepared by: Excell Seltzer  Exercises - Supine Bridge  - 1 x daily - 7 x weekly - 3 sets - 10 reps - Clamshell  - 1 x daily - 7 x weekly - 3 sets - 10 reps - Supine Lower Trunk Rotation  - 1 x daily - 7 x weekly - 3 sets - 10 reps - Seated Long Arc Quad  - 1 x daily - 7 x weekly - 3 sets - 10 reps - Seated Hamstring Curl with Anchored Resistance  - 1 x daily - 7 x weekly - 3 sets - 10 reps - Sit to Stand with Armchair  - 1 x daily - 7 x weekly - 3 sets - 10 reps - Standing March with Counter Support  - 1 x daily - 7 x weekly - 3 sets - 10 reps - Mini Squat with Counter Support  - 1 x daily - 7 x weekly - 3 sets - 10 reps  Therapy Documentation Precautions:  Precautions Precautions: Fall, Other (comment) Precaution Comments: R hemiparesis, chronic LBP Restrictions Weight Bearing Restrictions: No       Therapy/Group:  Individual Therapy   Excell Seltzer, PT, DPT, CSRS 06/13/2021, 3:40 PM

## 2021-06-13 NOTE — Progress Notes (Signed)
Occupational Therapy Discharge Summary  Patient Details  Name: [name] [name] MRN: [id number] Date of Birth: 1926-12-19   Patient has met 10 of 10 long term goals due to improved activity tolerance, improved balance, postural control, ability to compensate for deficits, functional use of  RIGHT upper extremity, and improved coordination.  Patient to discharge at overall  S to min A  level.  Patient's care partner is independent to provide the necessary physical assistance at discharge.  Pt to DC at the below ADL/bathroom transfer performance level. Assist levels represent pt's most consistent performance when alert/participatory. Pt continues to be primarily limited by mild R HP, chronic LBP, and decreased postural control/posterior bias. Family education completed on 06/13/2021 with caregivers demonstrating good understanding of pt's current deficits and need for close S to CGA with all mobility. Pt and caregivers will benefit from continued [facility] OT to facilitate improved caregiver education, functional mobility, and occupational performance. Pt is to have 24/7 S provided by wife/daughters and hired caregivers.   Reasons goals not met: NA  Recommendation:  Patient will benefit from ongoing skilled OT services in home health setting to continue to advance functional skills in the area of BADL, iADL, and Reduce care partner burden.  Equipment: No equipment provided  Reasons for discharge: treatment goals met and discharge from hospital  Patient/family agrees with progress made and goals achieved: Yes  OT Discharge Precautions/Restrictions  Precautions Precautions: Fall;Other (comment) Precaution Comments: R hemiparesis, chronic LBP Restrictions Weight Bearing Restrictions: No  ADL ADL Eating: Set up Where Assessed-Eating: Wheelchair Grooming: Setup Where Assessed-Grooming: Sitting at sink Upper Body Bathing: Supervision/safety Where Assessed-Upper Body Bathing: Shower Lower Body  Bathing: Minimal assistance Where Assessed-Lower Body Bathing: Shower Upper Body Dressing: Setup Where Assessed-Upper Body Dressing: Sitting at sink Lower Body Dressing: Minimal assistance Where Assessed-Lower Body Dressing: Edge of bed Toileting: Minimal assistance Where Assessed-Toileting: Editor, commissioning Method: Arts development officer: Bedside commode, Energy manager: Not assessed Social research officer, government: Curator Method: Radiographer, therapeutic: Civil engineer, contracting with back, Grab bars Vision Baseline Vision/History: 1 Wears glasses Patient Visual Report: No change from baseline Vision Assessment?: No apparent visual deficits Perception  Perception: Within Functional Limits Praxis Praxis: Intact Cognition Cognition Overall Cognitive Status: Within Functional Limits for tasks assessed Arousal/Alertness: Awake/alert Orientation Level: Person;Place;Situation Person: Oriented Place: Oriented Situation: Oriented Memory: Appears intact Attention: Focused;Sustained;Selective Selective Attention: Appears intact Awareness: Appears intact Problem Solving: Appears intact Safety/Judgment: Appears intact Brief Interview for Mental Status (BIMS) Repetition of Three Words (First Attempt): 3 Temporal Orientation: Year: Correct Temporal Orientation: Month: Accurate within 5 days Temporal Orientation: Day: Correct Recall: "Sock": Yes, no cue required Recall: "Blue": Yes, no cue required Recall: "Bed": Yes, no cue required BIMS Summary Score: 15 Sensation Sensation Light Touch: Impaired Detail Peripheral sensation comments: hx of peripheral neuropathy with decreased sensation in distal B LEs (limited access to L lower leg due to [employer] boot) Light Touch Impaired Details: Impaired LLE;Impaired RLE Hot/Cold: Appears Intact Proprioception: Impaired Detail Stereognosis: Impaired  Detail Stereognosis Impaired Details: Impaired RUE Additional Comments: mild R hemi with sensorimotor deficits, not always aware of that he is not fully grasping objects with R hand Coordination Gross Motor Movements are Fluid and Coordinated: No Fine Motor Movements are Fluid and Coordinated: No Coordination and Movement Description: mild R hemiparesis with generalized deconditioning limited by chronic LBP Finger Nose Finger Test: slower on R than L Motor  Motor Motor: Hemiplegia;Abnormal  postural alignment and control Motor - Discharge Observations: mild R hemiparesis with generalized deconditioning and increased low back pain, improved HP from eval Mobility  Bed Mobility Bed Mobility: Supine to Sit;Sit to Supine Supine to Sit: Contact Guard/Touching assist Sit to Supine: Contact Guard/Touching assist Transfers Sit to Stand: Minimal Assistance - Patient > 75%;Contact Guard/Touching assist Stand to Sit: Minimal Assistance - Patient > 75%;Contact Guard/Touching assist  Trunk/Postural Assessment  Cervical Assessment Cervical Assessment: Exceptions to [facility] (forward head) Thoracic Assessment Thoracic Assessment: Exceptions to [facility] (thoracic kyphosis with rounded shoulders) Lumbar Assessment Lumbar Assessment: Exceptions to [facility] (posterior pelvic tilt in sitting) Postural Control Postural Control: Deficits on evaluation (mild posterior bias in standing)  Balance Balance Balance Assessed: Yes Static Sitting Balance Static Sitting - Balance Support: Feet supported Static Sitting - Level of Assistance: 6: Modified independent (Device/Increase time) Dynamic Sitting Balance Dynamic Sitting - Balance Support: During functional activity Dynamic Sitting - Level of Assistance: 5: Stand by assistance Static Standing Balance Static Standing - Balance Support: Bilateral upper extremity supported;During functional activity Static Standing - Level of Assistance: 5: Stand by assistance Dynamic  Standing Balance Dynamic Standing - Balance Support: During functional activity;Bilateral upper extremity supported Dynamic Standing - Level of Assistance: 4: Min assist;5: Stand by assistance Extremity/Trunk Assessment RUE Assessment RUE Assessment: Within Functional Limits General Strength Comments: 4+/5 throughout, grossly WFL for BADL RUE Body System: Neuro Brunstrum levels for arm and hand: Arm;Hand Brunstrum level for arm: Stage V Relative Independence from Synergy Brunstrum level for hand: Stage VI Isolated joint movements LUE Assessment LUE Assessment: Within Functional Limits   Volanda Napoleon MS, OTR/L  06/13/2021, 3:01 PM

## 2021-06-13 NOTE — Progress Notes (Signed)
PROGRESS NOTE   Subjective/Complaints:  Family training today Discussed bladder , nl PVR for last several days, no routine PVRs at present Feels more swollen R leg , discussed reduce dose of torsemide while the pt was drinking less   Review of Systems  Constitutional:  Negative for fever.  Respiratory:  Negative for shortness of breath.   Cardiovascular:  Negative for chest pain.  Gastrointestinal:  Negative for abdominal pain, constipation, nausea and vomiting.  Genitourinary:  Positive for frequency (incontinent). Negative for hematuria.      Objective:   No results found. Recent Labs    06/11/21 0528  WBC 4.8  HGB 8.3*  HCT 25.6*  PLT 151    Recent Labs    06/11/21 0528  NA 139  K 3.7  CL 106  CO2 24  GLUCOSE 92  BUN 30*  CREATININE 1.50*  CALCIUM 8.8*     Intake/Output Summary (Last 24 hours) at 06/13/2021 0837 Last data filed at 06/12/2021 1850 Gross per 24 hour  Intake 480 ml  Output --  Net 480 ml      Pressure Injury 05/26/21 Heel Left Stage 3 -  Full thickness tissue loss. Subcutaneous fat may be visible but bone, tendon or muscle are NOT exposed. (Active)  05/26/21 (Initially identified 10/22/20; follow at wound clinic; wife doing dressings)   Location: Heel  Location Orientation: Left  Staging: Stage 3 -  Full thickness tissue loss. Subcutaneous fat may be visible but bone, tendon or muscle are NOT exposed.  Wound Description (Comments):   Present on Admission: Yes     Physical Exam: Vital Signs Blood pressure (!) 121/56, pulse 67, temperature 98 F (36.7 C), resp. rate 16, height 6' (1.829 m), weight 98.4 kg, SpO2 97 %.   General: No acute distress Mood and affect are appropriate Heart: Regular rate and rhythm no rubs murmurs or extra sounds Lungs: Clear to auscultation, breathing unlabored, no rales or wheezes Abdomen: Positive bowel sounds, soft nontender to palpation,  nondistended Extremities: No clubbing, cyanosis, or edema Skin: No evidence of breakdown, no evidence of rash   Skin: gr 2 decub, with scabbing RIght heel, a few other assorted tears and abrasions. Left pre tib surgical excision with granulating tissue , ~2cm diameter no drainage , Let ant ankle venous stasis ulcer granulating well as below--unna dressing in place   Stasis dermatitis BLE pre tib   Sensory exam reduced sensation left toes    Musculoskeletal: No joint redness or swelling noted  Neuro - mild /mod exp aphasia  Strength 5 out of 5 in bilateral upper extremities 4 out of 5 bilateral hip flexion 5 out of 5 bilateral knee extension Right ankle PF and DF 2 out of 5, left ankle PF 2 out of 5, DF 1 out of 5.  Tone is normal No joint swelling noted   Assessment/Plan: 1. Functional deficits which require 3+ hours per day of interdisciplinary therapy in a comprehensive inpatient rehab setting. Physiatrist is providing close team supervision and 24 hour management of active medical problems listed below. Physiatrist and rehab team continue to assess barriers to discharge/monitor patient progress toward functional and medical goals  Care Tool:  Bathing    Body parts bathed by patient: Right arm, Left arm, Chest, Abdomen, Front perineal area, Buttocks, Right upper leg, Left upper leg, Face     Body parts n/a: Right lower leg, Left lower leg   Bathing assist Assist Level: Supervision/Verbal cueing     Upper Body Dressing/Undressing Upper body dressing   What is the patient wearing?: Pull over shirt    Upper body assist Assist Level: Set up assist    Lower Body Dressing/Undressing Lower body dressing      What is the patient wearing?: Pants     Lower body assist Assist for lower body dressing: Minimal Assistance - Patient > 75%     Toileting Toileting Toileting Activity did not occur (Clothing management and hygiene only): N/A (no void or bm)  Toileting assist  Assist for toileting: Maximal Assistance - Patient 25 - 49%     Transfers Chair/bed transfer  Transfers assist     Chair/bed transfer assist level: Minimal Assistance - Patient > 75% (partial squat pivot)     Locomotion Ambulation   Ambulation assist   Ambulation activity did not occur: Safety/medical concerns  Assist level: 2 helpers Assistive device: Parallel bars Max distance: 6-25f   Walk 10 feet activity   Assist  Walk 10 feet activity did not occur: Safety/medical concerns        Walk 50 feet activity   Assist Walk 50 feet with 2 turns activity did not occur: Safety/medical concerns         Walk 150 feet activity   Assist Walk 150 feet activity did not occur: Safety/medical concerns         Walk 10 feet on uneven surface  activity   Assist Walk 10 feet on uneven surfaces activity did not occur: Safety/medical concerns         Wheelchair     Assist Is the patient using a wheelchair?: Yes Type of Wheelchair: Manual    Wheelchair assist level: Independent Max wheelchair distance: 150'    Wheelchair 50 feet with 2 turns activity    Assist        Assist Level: Independent   Wheelchair 150 feet activity     Assist      Assist Level: Independent   Blood pressure (!) 121/56, pulse 67, temperature 98 F (36.7 C), resp. rate 16, height 6' (1.829 m), weight 98.4 kg, SpO2 97 %.   Medical Problem List and Plan: 1. Functional deficits secondary to left MCA embolic CVA             -patient may shower             -ELOS/Goals:  06/14/21 or 6/15 if family training incomplete  PT/OT Min A wheelchair level; SLP Mod I  -Continue CIR therapies including PT, OT, and SLP     2.  Antithrombotics: -DVT/anticoagulation:  Pharmaceutical: Other (comment) Eliquis now, previously on xarelto             -antiplatelet therapy: N/A 3. Pain: continue Lidoderm patch, Neurontin 200 mg twice daily and 100 mg nightly, tramadol as needed 4.  Mood: Namenda 10 mg twice daily, Razadyne 8 mg daily             -antipsychotic agents: N/A 5. Neuropsych: This patient is capable of making decisions on his own behalf. 6. Chronic lower extremity wounds. LLE wound from Mohs procedure. Follow-up wound care nurse. Will examine today. Consulted wound care nurse to perform dressing change today given  worsening pain Appreciate ortho tech,  unna boot replaced Vaseline gauze , 2x2, all covered by Unna boot change weekly  7. Fluids/Electrolytes/Nutrition: Routine in and outs with follow-up chemistries 8.  Atrial fibrillation.  Amiodarone 200 mg daily.  Continue Eliquis.  Cardiac rate controlled 9.  CAD/CABG 2014.  No chest pain or shortness of breath.Continue Imdur 60 mg daily -no current cp 10.  History of severe aortic stenosis status post TAVR procedure 2021.  Follow-up cardiology services 11.  History of PMR.  Currently maintained on ARAVA 10 mg daily as well as chronic prednisone 3 mg daily 12.  BPH.  Proscar 5 mg daily.  Check PVR, Has been using external catheter 13.  Hyperlipidemia.  Lipitor/Zetia 14.  Chronic systolic congestive heart failure.  Demadex 40 mg daily. Imdur '60mg'$  daily.  Elevated Creat, reduced demadex to '20mg'$  qd, monitor for fluid overload    Filed Weights   06/12/21 0358 06/12/21 0402 06/13/21 0402  Weight: 93.5 kg 93.5 kg 98.4 kg   6/13, large jump in weight , ? I/O balance tipping positively , increased RLE wsewelling , unna boot on left , no SOB, resume usual dose demadex '40mg'$   15.  OSA.  CPAP 16.  GERD.  Protonix 17.  PAD with lymphedema status post balloon angioplasty of posterior tibial artery October 2022.  Follow-up outpatient 18.  Chronic anemia.  Followed by hematology services.  He had been receiving Epogen injections as an outpatient.          6/11 cont to monitor weekly , no hematuria or gross bleeding     Latest Ref Rng & Units 06/11/2021    5:28 AM 06/03/2021    6:36 AM 06/02/2021    5:51 AM  CBC  WBC 4.0 -  10.5 K/uL 4.8   5.5   Hemoglobin 13.0 - 17.0 g/dL 8.3  9.1  10.0   Hematocrit 39.0 - 52.0 % 25.6  29.0  31.9   Platelets 150 - 400 K/uL 151   165     19. Dementia, continue galantamine and memantine 20. CKD IIIB, avoid nephrotoxic medications Lab Results  Component Value Date   CREATININE 1.50 (H) 06/11/2021   CREATININE 1.71 (H) 06/07/2021   CREATININE 1.41 (H) 06/05/2021   21. HTN continue imdur Vitals:   06/12/21 2054 06/13/21 0402  BP: 128/82 (!) 121/56  Pulse: 79 67  Resp: 16 16  Temp: 98.2 F (36.8 C) 98 F (36.7 C)  SpO2: 98% 97%   Controlled 6/13  22. Urinary retention with cath induced Hematuria, resolved but still at risk due to cont Int cath as well as anticoag with Eliquis   -6/13- no I/O cath recorded for several days   -continue timed voids q3 hours while awake. Needs to be out of bed to void  PVR prn at this point  23. Hypokalemia:improved with supplementation     Latest Ref Rng & Units 06/11/2021    5:28 AM 06/07/2021   10:26 AM 06/05/2021    6:40 AM  BMP  Glucose 70 - 99 mg/dL 92  105  112   BUN 8 - 23 mg/dL '30  31  29   '$ Creatinine 0.61 - 1.24 mg/dL 1.50  1.71  1.41   Sodium 135 - 145 mmol/L 139  136  138   Potassium 3.5 - 5.1 mmol/L 3.7  4.1  4.2   Chloride 98 - 111 mmol/L 106  103  108   CO2 22 - 32 mmol/L 24  24  25   Calcium 8.9 - 10.3 mg/dL 8.8  9.0  8.8    Elevated Creat , likely overdiuresis, Cr improved today to 1.5--continue with current plan  24. Constipation: added miralax daily prn.  -lbm 6/10  25.  Bilateral foot drop , hx spine surgery in past , last Lumbar MRI 01/08/20 demonstrated moderate L4-5 stenosis, T spine MRI on 08/17/20 demonstrated T11-12 moderate stenosis but no spinal cord impingement, also old T8 comp fx  Repeat Lumbar xray no new findings  LOS: 13 days A FACE TO FACE EVALUATION WAS PERFORMED  Charlett Blake 06/13/2021, 8:37 AM

## 2021-06-14 ENCOUNTER — Telehealth: Payer: Self-pay | Admitting: Physical Medicine and Rehabilitation

## 2021-06-14 ENCOUNTER — Other Ambulatory Visit (HOSPITAL_COMMUNITY): Payer: Self-pay

## 2021-06-14 NOTE — Telephone Encounter (Signed)
Patient's wife [name] called needing to schedule an appointment with Dr. Ernestina Patches. I advised will forward message for call back.  [phone]

## 2021-06-14 NOTE — Progress Notes (Signed)
Inpatient Rehabilitation Care Coordinator Discharge Note   Patient Details  Name: [name] [name] MRN: [id number] Date of Birth: [birth year]   Discharge location: Home  Length of Stay: 14 Days  Discharge activity level: Min A  Home/community participation: spouse and family  Patient response BZ:JIRCVE Literacy - How often do you need to have someone help you when you read instructions, pamphlets, or other written material from your doctor or pharmacy?: Never  Patient response LF:YBOFBP Isolation - How often do you feel lonely or isolated from those around you?: Never  Services provided included: MD, RD, PT, SLP, OT, RN, CM, TR, Pharmacy, SW  Financial Services:  Charity fundraiser Utilized: [place] offered to/list presented to: patient  Follow-up services arranged:  Alligator: [place]         Patient response to transportation need: Is the patient able to respond to transportation needs?: Yes In the past 12 months, has lack of transportation kept you from medical appointments or from getting medications?: No In the past 12 months, has lack of transportation kept you from meetings, work, or from getting things needed for daily living?: No    Comments (or additional information):  Patient/Family verbalized understanding of follow-up arrangements:  Yes  Individual responsible for coordination of the follow-up plan: [name]  Confirmed correct DME delivered: Dyanne Iha 06/14/2021    Dyanne Iha

## 2021-06-14 NOTE — Progress Notes (Addendum)
PROGRESS NOTE   Subjective/Complaints:  Intake down to 481m yesterday , increased torsemide dose but no output numbers available   Review of Systems  Constitutional:  Negative for fever.  Respiratory:  Negative for shortness of breath.   Cardiovascular:  Negative for chest pain.  Gastrointestinal:  Negative for abdominal pain, constipation, nausea and vomiting.  Genitourinary:  Positive for frequency (incontinent). Negative for hematuria.      Objective:   No results found. No results for input(s): "WBC", "HGB", "HCT", "PLT" in the last 72 hours.  No results for input(s): "NA", "K", "CL", "CO2", "GLUCOSE", "BUN", "CREATININE", "CALCIUM" in the last 72 hours.   Intake/Output Summary (Last 24 hours) at 06/14/2021 0857 Last data filed at 06/13/2021 1821 Gross per 24 hour  Intake 473 ml  Output --  Net 473 ml      Pressure Injury 05/26/21 Heel Left Stage 3 -  Full thickness tissue loss. Subcutaneous fat may be visible but bone, tendon or muscle are NOT exposed. (Active)  05/26/21 (Initially identified 10/22/20; follow at wound clinic; wife doing dressings)   Location: Heel  Location Orientation: Left  Staging: Stage 3 -  Full thickness tissue loss. Subcutaneous fat may be visible but bone, tendon or muscle are NOT exposed.  Wound Description (Comments):   Present on Admission: Yes     Physical Exam: Vital Signs Blood pressure (!) 151/72, pulse 73, temperature 98.1 F (36.7 C), temperature source Oral, resp. rate 18, height 6' (1.829 m), weight 98.4 kg, SpO2 (!) 80 %.   General: No acute distress Mood and affect are appropriate Heart: Regular rate and rhythm no rubs murmurs or extra sounds Lungs: Clear to auscultation, breathing unlabored, no rales or wheezes Abdomen: Positive bowel sounds, soft nontender to palpation, nondistended Extremities: No clubbing, cyanosis, or edema Skin: No evidence of breakdown, no  evidence of rash   Skin: gr 2 decub, with scabbing RIght heel, a few other assorted tears and abrasions. Left pre tib surgical excision with granulating tissue , ~2cm diameter no drainage , Let ant ankle venous stasis ulcer granulating well as below--unna dressing in place   Stasis dermatitis BLE pre tib   Sensory exam reduced sensation left toes    Musculoskeletal: No joint redness or swelling noted  Neuro - mild /mod exp aphasia  Strength 5 out of 5 in bilateral upper extremities 4 out of 5 bilateral hip flexion 5 out of 5 bilateral knee extension Right ankle PF and DF 2 out of 5, left ankle PF 2 out of 5, DF 1 out of 5.  Tone is normal No joint swelling noted   Assessment/Plan: 1. Functional deficits due to CVA Stable for D/C today F/u PCP in 3-4 weeks F/u PM&R 2 weeks See D/C summary See D/C instructions   Care Tool:  Bathing    Body parts bathed by patient: Right arm, Left arm, Chest, Abdomen, Front perineal area, Buttocks, Right upper leg, Left upper leg, Face     Body parts n/a: Right lower leg, Left lower leg   Bathing assist Assist Level: Supervision/Verbal cueing     Upper Body Dressing/Undressing Upper body dressing   What is the patient wearing?:  Pull over shirt    Upper body assist Assist Level: Set up assist    Lower Body Dressing/Undressing Lower body dressing      What is the patient wearing?: Pants, Underwear/pull up     Lower body assist Assist for lower body dressing: Minimal Assistance - Patient > 75%     Toileting Toileting Toileting Activity did not occur (Clothing management and hygiene only): N/A (no void or bm)  Toileting assist Assist for toileting: Minimal Assistance - Patient > 75%     Transfers Chair/bed transfer  Transfers assist     Chair/bed transfer assist level: Contact Guard/Touching assist     Locomotion Ambulation   Ambulation assist   Ambulation activity did not occur: Safety/medical concerns  Assist  level: 2 helpers Assistive device: Walker-rolling Max distance: 25'   Walk 10 feet activity   Assist  Walk 10 feet activity did not occur: Safety/medical concerns  Assist level: 2 helpers Assistive device: Walker-rolling   Walk 50 feet activity   Assist Walk 50 feet with 2 turns activity did not occur: Safety/medical concerns         Walk 150 feet activity   Assist Walk 150 feet activity did not occur: Safety/medical concerns         Walk 10 feet on uneven surface  activity   Assist Walk 10 feet on uneven surfaces activity did not occur: Safety/medical concerns         Wheelchair     Assist Is the patient using a wheelchair?: Yes Type of Wheelchair: Manual    Wheelchair assist level: Independent Max wheelchair distance: 150'    Wheelchair 50 feet with 2 turns activity    Assist        Assist Level: Independent   Wheelchair 150 feet activity     Assist      Assist Level: Independent   Blood pressure (!) 151/72, pulse 73, temperature 98.1 F (36.7 C), temperature source Oral, resp. rate 18, height 6' (1.829 m), weight 98.4 kg, SpO2 (!) 80 %.   Medical Problem List and Plan: 1. Functional deficits secondary to left MCA embolic CVA             -patient may shower             -ELOS/Goals:  06/14/21 or 6/15 if family training incomplete  PT/OT Min A wheelchair level; SLP Mod I  -Continue CIR therapies including PT, OT, and SLP     2.  Antithrombotics: -DVT/anticoagulation:  Pharmaceutical: Other (comment) Eliquis now, previously on xarelto             -antiplatelet therapy: N/A 3. Pain: continue Lidoderm patch, Neurontin 200 mg twice daily and 100 mg nightly, tramadol as needed 4. Mood: Namenda 10 mg twice daily, Razadyne 8 mg daily             -antipsychotic agents: N/A 5. Neuropsych: This patient is capable of making decisions on his own behalf. 6. Chronic lower extremity wounds. LLE wound from Mohs procedure. Follow-up wound  care nurse. Will examine today. Consulted wound care nurse to perform dressing change today given worsening pain Appreciate ortho tech,  unna boot replaced Vaseline gauze , 2x2, all covered by Unna boot change weekly  7. Fluids/Electrolytes/Nutrition: Routine in and outs with follow-up chemistries 8.  Atrial fibrillation.  Amiodarone 200 mg daily.  Continue Eliquis.  Cardiac rate controlled 9.  CAD/CABG 2014.  No chest pain or shortness of breath.Continue Imdur 60 mg daily -no  current cp 10.  History of severe aortic stenosis status post TAVR procedure 2021.  Follow-up cardiology services 11.  History of PMR.  Currently maintained on ARAVA 10 mg daily as well as chronic prednisone 3 mg daily 12.  BPH.  Proscar 5 mg daily.  Check PVR, Has been using external catheter 13.  Hyperlipidemia.  Lipitor/Zetia 14.  Chronic systolic congestive heart failure.  Demadex 40 mg daily. Imdur '60mg'$  daily.     Filed Weights   06/12/21 0358 06/12/21 0402 06/13/21 0402  Weight: 93.5 kg 93.5 kg 98.4 kg   6/13, large jump in weight , ? I/O balance tipping positively , increased RLE wsewelling , unna boot on left , no SOB, resume usual dose demadex '40mg'$   15.  OSA.  CPAP 16.  GERD.  Protonix 17.  PAD with lymphedema status post balloon angioplasty of posterior tibial artery October 2022.  Follow-up outpatient 18.  Chronic anemia.  Followed by hematology services.  He had been receiving Epogen injections as an outpatient.          6/11 cont to monitor weekly , no hematuria or gross bleeding     Latest Ref Rng & Units 06/11/2021    5:28 AM 06/03/2021    6:36 AM 06/02/2021    5:51 AM  CBC  WBC 4.0 - 10.5 K/uL 4.8   5.5   Hemoglobin 13.0 - 17.0 g/dL 8.3  9.1  10.0   Hematocrit 39.0 - 52.0 % 25.6  29.0  31.9   Platelets 150 - 400 K/uL 151   165     19. Dementia, continue galantamine and memantine 20. CKD IIIB, avoid nephrotoxic medications Lab Results  Component Value Date   CREATININE 1.50 (H) 06/11/2021    CREATININE 1.71 (H) 06/07/2021   CREATININE 1.41 (H) 06/05/2021   21. HTN continue imdur Vitals:   06/13/21 1943 06/14/21 0648  BP: 118/75 (!) 151/72  Pulse: 79 73  Resp: 16 18  Temp: 97.7 F (36.5 C) 98.1 F (36.7 C)  SpO2: 98% (!) 80%     22. Urinary retention with cath induced Hematuria, resolved but still at risk due to cont Int cath as well as anticoag with Eliquis   -6/13- no I/O cath recorded for several days   -continue timed voids q3 hours while awake. Needs to be out of bed to void  PVR prn at this point  23. Hypokalemia:improved with supplementation     Latest Ref Rng & Units 06/11/2021    5:28 AM 06/07/2021   10:26 AM 06/05/2021    6:40 AM  BMP  Glucose 70 - 99 mg/dL 92  105  112   BUN 8 - 23 mg/dL '30  31  29   '$ Creatinine 0.61 - 1.24 mg/dL 1.50  1.71  1.41   Sodium 135 - 145 mmol/L 139  136  138   Potassium 3.5 - 5.1 mmol/L 3.7  4.1  4.2   Chloride 98 - 111 mmol/L 106  103  108   CO2 22 - 32 mmol/L '24  24  25   '$ Calcium 8.9 - 10.3 mg/dL 8.8  9.0  8.8    Elevated Creat , likely overdiuresis, Cr improved today to 1.5--continue with current plan  24. Constipation: added miralax daily prn.  -lbm 6/10  25.  Bilateral foot drop , hx spine surgery in past , last Lumbar MRI 01/08/20 demonstrated moderate L4-5 stenosis, T spine MRI on 08/17/20 demonstrated T11-12 moderate stenosis but no spinal cord impingement,  also old T8 comp fx  Repeat Lumbar xray no new findings  LOS: 14 days A FACE TO FACE EVALUATION WAS PERFORMED  Charlett Blake 06/14/2021, 8:57 AM

## 2021-06-14 NOTE — Progress Notes (Signed)
Patient ID: [name] [name], male   DOB: 1926/11/27, 88 y.o.   MRN: [id number]  Sw informed [facility] unable to see patient due to RN need. Patient [facility] referral sent to [facility].

## 2021-06-14 NOTE — Progress Notes (Signed)
Inpatient Rehabilitation Discharge Medication Review by a Pharmacist  A complete drug regimen review was completed for this patient to identify any potential clinically significant medication issues.  High Risk Drug Classes Is patient taking? Indication by Medication  Antipsychotic No   Anticoagulant Yes Apixaban - atrial fibrillation  Antibiotic No   Opioid Yes Tramadol - moderate pain  Antiplatelet No   Hypoglycemics/insulin No   Vasoactive Medication Yes Amiodarone - atrial fibrillation Imdur and Torsemide - CHF  Chemotherapy No   Other Yes Atorvastatin, Ezetimibe, Fish Oil - hyperlipidemia Potassium - supplement Finasteride and Tamsulosin- BPH Gabapentin - neuropathic pain Galantamine and Memantine - dementia/mood Leflunomide and Prednisone - polymyalgia rheumatica Lidocaine patch - back pain Pantoprazole - GERD MVI, Vit C, Vit D, B-12, Magnesium - supplements Docusate - laxative Prolia - osteoporosis PRNs: Tylenol - mild pain     Type of Medication Issue Identified Description of Issue Recommendation(s)  Drug Interaction(s) (clinically significant)     Duplicate Therapy     Allergy     No Medication Administration End Date     Incorrect Dose     Additional Drug Therapy Needed     Significant med changes from prior encounter (inform family/care partners about these prior to discharge). Prior Rivaroxiban, changed to Apixaban Off Hydralazine and Toprol since acute admission.   Other       Clinically significant medication issues were identified that warrant physician communication and completion of prescribed/recommended actions by midnight of the next day:  No  Pharmacist comments:   Time spent performing this drug regimen review (minutes):  20   Arty Baumgartner, [place] 06/14/2021 9:11 AM

## 2021-06-15 ENCOUNTER — Inpatient Hospital Stay: Payer: Medicare Other

## 2021-06-15 ENCOUNTER — Inpatient Hospital Stay: Payer: Medicare Other | Admitting: Oncology

## 2021-06-16 DIAGNOSIS — I69351 Hemiplegia and hemiparesis following cerebral infarction affecting right dominant side: Secondary | ICD-10-CM | POA: Diagnosis not present

## 2021-06-16 DIAGNOSIS — I890 Lymphedema, not elsewhere classified: Secondary | ICD-10-CM | POA: Diagnosis not present

## 2021-06-16 DIAGNOSIS — L89623 Pressure ulcer of left heel, stage 3: Secondary | ICD-10-CM | POA: Diagnosis not present

## 2021-06-16 DIAGNOSIS — D631 Anemia in chronic kidney disease: Secondary | ICD-10-CM | POA: Diagnosis not present

## 2021-06-16 DIAGNOSIS — I69321 Dysphasia following cerebral infarction: Secondary | ICD-10-CM | POA: Diagnosis not present

## 2021-06-16 DIAGNOSIS — L97329 Non-pressure chronic ulcer of left ankle with unspecified severity: Secondary | ICD-10-CM | POA: Diagnosis not present

## 2021-06-16 DIAGNOSIS — I5022 Chronic systolic (congestive) heart failure: Secondary | ICD-10-CM | POA: Diagnosis not present

## 2021-06-16 DIAGNOSIS — F0390 Unspecified dementia without behavioral disturbance: Secondary | ICD-10-CM | POA: Diagnosis not present

## 2021-06-16 DIAGNOSIS — Z483 Aftercare following surgery for neoplasm: Secondary | ICD-10-CM | POA: Diagnosis not present

## 2021-06-16 DIAGNOSIS — I872 Venous insufficiency (chronic) (peripheral): Secondary | ICD-10-CM | POA: Diagnosis not present

## 2021-06-16 DIAGNOSIS — I480 Paroxysmal atrial fibrillation: Secondary | ICD-10-CM | POA: Diagnosis not present

## 2021-06-16 DIAGNOSIS — L89612 Pressure ulcer of right heel, stage 2: Secondary | ICD-10-CM | POA: Diagnosis not present

## 2021-06-16 DIAGNOSIS — I2510 Atherosclerotic heart disease of native coronary artery without angina pectoris: Secondary | ICD-10-CM | POA: Diagnosis not present

## 2021-06-16 DIAGNOSIS — N1832 Chronic kidney disease, stage 3b: Secondary | ICD-10-CM | POA: Diagnosis not present

## 2021-06-16 DIAGNOSIS — I130 Hypertensive heart and chronic kidney disease with heart failure and stage 1 through stage 4 chronic kidney disease, or unspecified chronic kidney disease: Secondary | ICD-10-CM | POA: Diagnosis not present

## 2021-06-16 DIAGNOSIS — I714 Abdominal aortic aneurysm, without rupture, unspecified: Secondary | ICD-10-CM | POA: Diagnosis not present

## 2021-06-19 DIAGNOSIS — S81802A Unspecified open wound, left lower leg, initial encounter: Secondary | ICD-10-CM | POA: Diagnosis not present

## 2021-06-20 DIAGNOSIS — I480 Paroxysmal atrial fibrillation: Secondary | ICD-10-CM | POA: Diagnosis not present

## 2021-06-20 DIAGNOSIS — I129 Hypertensive chronic kidney disease with stage 1 through stage 4 chronic kidney disease, or unspecified chronic kidney disease: Secondary | ICD-10-CM | POA: Diagnosis not present

## 2021-06-20 DIAGNOSIS — M353 Polymyalgia rheumatica: Secondary | ICD-10-CM | POA: Diagnosis not present

## 2021-06-20 DIAGNOSIS — I5020 Unspecified systolic (congestive) heart failure: Secondary | ICD-10-CM | POA: Diagnosis not present

## 2021-06-21 ENCOUNTER — Encounter (HOSPITAL_BASED_OUTPATIENT_CLINIC_OR_DEPARTMENT_OTHER): Payer: Medicare Other | Attending: Physician Assistant | Admitting: Physician Assistant

## 2021-06-21 DIAGNOSIS — L988 Other specified disorders of the skin and subcutaneous tissue: Secondary | ICD-10-CM | POA: Insufficient documentation

## 2021-06-21 DIAGNOSIS — I7389 Other specified peripheral vascular diseases: Secondary | ICD-10-CM | POA: Diagnosis not present

## 2021-06-21 DIAGNOSIS — S41112A Laceration without foreign body of left upper arm, initial encounter: Secondary | ICD-10-CM | POA: Diagnosis not present

## 2021-06-21 DIAGNOSIS — L89153 Pressure ulcer of sacral region, stage 3: Secondary | ICD-10-CM | POA: Insufficient documentation

## 2021-06-21 DIAGNOSIS — I4891 Unspecified atrial fibrillation: Secondary | ICD-10-CM | POA: Insufficient documentation

## 2021-06-21 DIAGNOSIS — W19XXXA Unspecified fall, initial encounter: Secondary | ICD-10-CM | POA: Diagnosis not present

## 2021-06-21 DIAGNOSIS — S71111D Laceration without foreign body, right thigh, subsequent encounter: Secondary | ICD-10-CM | POA: Insufficient documentation

## 2021-06-21 DIAGNOSIS — N401 Enlarged prostate with lower urinary tract symptoms: Secondary | ICD-10-CM | POA: Insufficient documentation

## 2021-06-21 DIAGNOSIS — L97322 Non-pressure chronic ulcer of left ankle with fat layer exposed: Secondary | ICD-10-CM | POA: Diagnosis not present

## 2021-06-21 DIAGNOSIS — I872 Venous insufficiency (chronic) (peripheral): Secondary | ICD-10-CM | POA: Diagnosis not present

## 2021-06-21 DIAGNOSIS — Z87891 Personal history of nicotine dependence: Secondary | ICD-10-CM | POA: Insufficient documentation

## 2021-06-21 DIAGNOSIS — L97312 Non-pressure chronic ulcer of right ankle with fat layer exposed: Secondary | ICD-10-CM | POA: Diagnosis not present

## 2021-06-21 DIAGNOSIS — X58XXXD Exposure to other specified factors, subsequent encounter: Secondary | ICD-10-CM | POA: Insufficient documentation

## 2021-06-21 DIAGNOSIS — I509 Heart failure, unspecified: Secondary | ICD-10-CM | POA: Diagnosis not present

## 2021-06-21 DIAGNOSIS — L97512 Non-pressure chronic ulcer of other part of right foot with fat layer exposed: Secondary | ICD-10-CM | POA: Diagnosis not present

## 2021-06-21 DIAGNOSIS — S41111D Laceration without foreign body of right upper arm, subsequent encounter: Secondary | ICD-10-CM | POA: Insufficient documentation

## 2021-06-21 DIAGNOSIS — L97822 Non-pressure chronic ulcer of other part of left lower leg with fat layer exposed: Secondary | ICD-10-CM | POA: Insufficient documentation

## 2021-06-21 DIAGNOSIS — I2510 Atherosclerotic heart disease of native coronary artery without angina pectoris: Secondary | ICD-10-CM | POA: Insufficient documentation

## 2021-06-21 DIAGNOSIS — N186 End stage renal disease: Secondary | ICD-10-CM | POA: Insufficient documentation

## 2021-06-21 DIAGNOSIS — G4730 Sleep apnea, unspecified: Secondary | ICD-10-CM | POA: Insufficient documentation

## 2021-06-21 DIAGNOSIS — I252 Old myocardial infarction: Secondary | ICD-10-CM | POA: Diagnosis not present

## 2021-06-21 DIAGNOSIS — F0390 Unspecified dementia without behavioral disturbance: Secondary | ICD-10-CM | POA: Diagnosis not present

## 2021-06-21 DIAGNOSIS — L89613 Pressure ulcer of right heel, stage 3: Secondary | ICD-10-CM | POA: Diagnosis present

## 2021-06-21 DIAGNOSIS — R82998 Other abnormal findings in urine: Secondary | ICD-10-CM | POA: Diagnosis not present

## 2021-06-21 DIAGNOSIS — I132 Hypertensive heart and chronic kidney disease with heart failure and with stage 5 chronic kidney disease, or end stage renal disease: Secondary | ICD-10-CM | POA: Insufficient documentation

## 2021-06-21 NOTE — Progress Notes (Signed)
[name] [name], [name] I. ([id number]) Visit Report for 06/21/2021 Arrival Information Details Patient Name: Date of Service: [name] [name] ID I. 06/21/2021 2:15 PM Medical Record Number: [id number] Patient Account Number: [id number] Date of Birth/Sex: Treating RN: 1926/09/08 (88 y.o. [name] Primary Care Oseph Imburgia: [name] Other Clinician: Referring Shery Wauneka: Treating Arlyn Bumpus/Extender: Eli Hose in Treatment: 4 Visit Information History Since Last Visit Added or deleted any medications: No Patient Arrived: Wheel Chair Any new allergies or adverse reactions: No Arrival Time: 14:10 Had a fall or experienced change in No Accompanied By: wife activities of daily living that may affect Transfer Assistance: None risk of falls: Patient Identification Verified: Yes Signs or symptoms of abuse/neglect since last visito No Secondary Verification Process Completed: Yes Hospitalized since last visit: Yes Patient Requires Transmission-Based Precautions: No Implantable device outside of the clinic excluding No Patient Has Alerts: Yes cellular tissue based products placed in the center Patient Alerts: Patient on Blood Thinner since last visit: Has Dressing in Place as Prescribed: Yes Pain Present Now: No Electronic Signature(s) Signed: 06/21/2021 4:19:27 PM By: Deon Pilling RN, BSN Entered By: Deon Pilling on 06/21/2021 14:15:28 -------------------------------------------------------------------------------- Clinic Level of Care Assessment Details Patient Name: Date of Service: [name], [name] 06/21/2021 2:15 PM Medical Record Number: [id number] Patient Account Number: [id number] Date of Birth/Sex: Treating RN: February 02, 1926 (88 y.o. [name] Primary Care Rumi Taras: [name] Other Clinician: Referring Noah Pelaez: Treating Milda Lindvall/Extender: Eli Hose in Treatment: 27 Clinic Level of Care Assessment Items TOOL 3 Quantity  Score X- 1 0 Use when EandM and Procedure is performed on FOLLOW-UP visit ASSESSMENTS - Nursing Assessment / Reassessment X- 1 10 Reassessment of Co-morbidities (includes updates in patient status) X- 1 5 Reassessment of Adherence to Treatment Plan ASSESSMENTS - Wound and Skin Assessment / Reassessment '[]'$  - Points for Wound Assessment can only be taken for a new wound of unknown or different etiology and a procedure is 0 NOT performed to that wound X- 1 5 Simple Wound Assessment / Reassessment - one wound '[]'$  - 0 Complex Wound Assessment / Reassessment - multiple wounds '[]'$  - 0 Dermatologic / Skin Assessment (not related to wound area) ASSESSMENTS - Focused Assessment X- 1 5 Circumferential Edema Measurements - multi extremities X- 1 10 Nutritional Assessment / Counseling / Intervention '[]'$  - 0 Lower Extremity Assessment (monofilament, tuning fork, pulses) '[]'$  - 0 Peripheral Arterial Disease Assessment (using hand held doppler) ASSESSMENTS - Ostomy and/or Continence Assessment and Care '[]'$  - 0 Incontinence Assessment and Management '[]'$  - 0 Ostomy Care Assessment and Management (repouching, etc.) PROCESS - Coordination of Care '[]'$  - Points for Discharge Coordination can only be taken for a new wound of unknown or different etiology and a 0 procedure is NOT performed to that wound X- 1 15 Simple Patient / Family Education for ongoing care '[]'$  - 0 Complex (extensive) Patient / Family Education for ongoing care '[]'$  - 0 Staff obtains Programmer, systems, Records, T Results / Process Orders est X- 1 10 Staff telephones HHA, Nursing Homes / Clarify orders / etc '[]'$  - 0 Routine Transfer to another Facility (non-emergent condition) '[]'$  - 0 Routine Hospital Admission (non-emergent condition) '[]'$  - 0 New Admissions / Biomedical engineer / Ordering NPWT Apligraf, etc. , '[]'$  - 0 Emergency Hospital Admission (emergent condition) X- 1 10 Simple Discharge Coordination '[]'$  - 0 Complex (extensive)  Discharge Coordination PROCESS - Special Needs '[]'$  - 0 Pediatric / Minor  Patient Management '[]'$  - 0 Isolation Patient Management '[]'$  - 0 Hearing / Language / Visual special needs '[]'$  - 0 Assessment of Community assistance (transportation, D/C planning, etc.) '[]'$  - 0 Additional assistance / Altered mentation '[]'$  - 0 Support Surface(s) Assessment (bed, cushion, seat, etc.) INTERVENTIONS - Wound Cleansing / Measurement '[]'$  - Points for Wound Cleaning / Measurement, Wound Dressing, Specimen Collection and Specimen taken to lab can 0 only be taken for a new wound of unknown or different etiology and a procedure is NOT performed to that wound X- 1 5 Simple Wound Cleansing - one wound '[]'$  - 0 Complex Wound Cleansing - multiple wounds X- 1 5 Wound Imaging (photographs - any number of wounds) '[]'$  - 0 Wound Tracing (instead of photographs) X- 1 5 Simple Wound Measurement - one wound '[]'$  - 0 Complex Wound Measurement - multiple wounds INTERVENTIONS - Wound Dressings X - Small Wound Dressing one or multiple wounds 1 10 '[]'$  - 0 Medium Wound Dressing one or multiple wounds '[]'$  - 0 Large Wound Dressing one or multiple wounds INTERVENTIONS - Miscellaneous '[]'$  - 0 External ear exam '[]'$  - 0 Specimen Collection (cultures, biopsies, blood, body fluids, etc.) '[]'$  - 0 Specimen(s) / Culture(s) sent or taken to Lab for analysis '[]'$  - 0 Patient Transfer (multiple staff / Civil Service fast streamer / Similar devices) '[]'$  - 0 Simple Staple / Suture removal (25 or less) '[]'$  - 0 Complex Staple / Suture removal (26 or more) '[]'$  - 0 Hypo / Hyperglycemic Management (close monitor of Blood Glucose) '[]'$  - 0 Ankle / Brachial Index (ABI) - do not check if billed separately X- 1 5 Vital Signs Has the patient been seen at the hospital within the last three years: Yes Total Score: 100 Level Of Care: New/Established - Level 3 Electronic Signature(s) Signed: 06/21/2021 4:19:27 PM By: Deon Pilling RN, BSN Entered By: Deon Pilling on  06/21/2021 15:43:15 -------------------------------------------------------------------------------- Encounter Discharge Information Details Patient Name: Date of Service: [name] [name], [name] 06/21/2021 2:15 PM Medical Record Number: [id number] Patient Account Number: [id number] Date of Birth/Sex: Treating RN: [birth year] (88 y.o. [name] Primary Care Alcides Nutting: [name] Other Clinician: Referring Mikel Hardgrove: Treating Corrie Brannen/Extender: Eli Hose in Treatment: 25 Encounter Discharge Information Items Post Procedure Vitals Discharge Condition: Stable Temperature (F): 98 Ambulatory Status: Wheelchair Pulse (bpm): 74 Discharge Destination: Home Respiratory Rate (breaths/min): 16 Transportation: Private Auto Blood Pressure (mmHg): 147/74 Accompanied By: wife Schedule Follow-up Appointment: Yes Clinical Summary of Care: Electronic Signature(s) Signed: 06/21/2021 4:19:27 PM By: Deon Pilling RN, BSN Entered By: Deon Pilling on 06/21/2021 15:17:16 -------------------------------------------------------------------------------- Lower Extremity Assessment Details Patient Name: Date of Service: [name] [name], [name] 06/21/2021 2:15 PM Medical Record Number: [id number] Patient Account Number: [id number] Date of Birth/Sex: Treating RN: [birth year] (88 y.o. [name] Primary Care Jaysten Essner: [name] Other Clinician: Referring Makylie Rivere: Treating Satin Boal/Extender: [name] in Treatment: 39 Edema Assessment Assessed: [Left: Yes] Patrice Paradise: Yes] Edema: [Left: Yes] [Right: Yes] Calf Left: Right: Point of Measurement: 34 cm From Medial Instep 40 cm 41 cm Ankle Left: Right: Point of Measurement: 11 cm From Medial Instep 26 cm 28 cm Vascular Assessment Pulses: Dorsalis Pedis Palpable: [Left:Yes] [Right:Yes] Electronic Signature(s) Signed: 06/21/2021 4:19:27 PM By: Deon Pilling RN, BSN Entered By: Deon Pilling on  06/21/2021 14:24:57 -------------------------------------------------------------------------------- Multi-Disciplinary Care Plan Details Patient Name: Date of Service: [name] [name], [name] 06/21/2021 2:15 PM Medical Record Number: [id number]  Patient Account Number: [id number] Date of Birth/Sex: Treating RN: October 30, 1926 (88 y.o. [name] Primary Care Tristy Udovich: [name] Other Clinician: Referring Rashauna Tep: Treating Nyara Capell/Extender: Eli Hose in Treatment: 57 Active Inactive Abuse / Safety / Falls / Self Care Management Nursing Diagnoses: Potential for falls Goals: Patient will remain injury free related to falls Date Initiated: 03/22/2021 Target Resolution Date: 07/28/2021 Goal Status: Active Interventions: Assess: immobility, friction, shearing, incontinence upon admission and as needed Assess impairment of mobility on admission and as needed per policy Assess self care needs on admission and as needed Provide education on fall prevention Notes: Nutrition Nursing Diagnoses: Potential for alteratiion in Nutrition/Potential for imbalanced nutrition Goals: Patient/caregiver agrees to and verbalizes understanding of need to obtain nutritional consultation Date Initiated: 03/22/2021 T arget Resolution Date: 07/28/2021 Goal Status: Active Interventions: Provide education on nutrition Treatment Activities: Education provided on Nutrition : 03/22/2021 Giving encouragement to exercise : 03/22/2021 Notes: Electronic Signature(s) Signed: 06/21/2021 4:19:27 PM By: Deon Pilling RN, BSN Entered By: Deon Pilling on 06/21/2021 14:58:09 -------------------------------------------------------------------------------- Pain Assessment Details Patient Name: Date of Service: [name] [name], [name] 06/21/2021 2:15 PM Medical Record Number: [id number] Patient Account Number: [id number] Date of Birth/Sex: Treating RN: Dec 24, 1926 (88 y.o. [name] Primary  Care Myrikal Messmer: [name] Other Clinician: Referring Shandie Bertz: Treating Yenifer Saccente/Extender: Eli Hose in Treatment: 85 Active Problems Location of Pain Severity and Description of Pain Patient Has Paino No Site Locations Rate the pain. Current Pain Level: 0 Pain Management and Medication Current Pain Management: Medication: No Cold Application: No Rest: No Massage: No Activity: No T.E.N.S.: No Heat Application: No Leg drop or elevation: No Is the Current Pain Management Adequate: Adequate How does your wound impact your activities of daily livingo Sleep: No Bathing: No Appetite: No Relationship With Others: No Bladder Continence: No Emotions: No Bowel Continence: No Work: No Toileting: No Drive: No Dressing: No Hobbies: No Engineer, maintenance) Signed: 06/21/2021 4:19:27 PM By: Deon Pilling RN, BSN Entered By: Deon Pilling on 06/21/2021 14:16:27 -------------------------------------------------------------------------------- Patient/Caregiver Education Details Patient Name: Date of Service: [name] [name] ID I. 6/21/2023andnbsp2:15 PM Medical Record Number: [id number] Patient Account Number: [id number] Date of Birth/Gender: Treating RN: 01-28-1926 (88 y.o. [name] Primary Care Physician: [name] Other Clinician: Referring Physician: Treating Physician/Extender: Eli Hose in Treatment: 8 Education Assessment Education Provided To: Patient Education Topics Provided Safety: Handouts: Personal Safety Methods: Explain/Verbal Responses: Reinforcements needed Engineer, maintenance) Signed: 06/21/2021 4:19:27 PM By: Deon Pilling RN, BSN Entered By: Deon Pilling on 06/21/2021 14:58:23 -------------------------------------------------------------------------------- Wound Assessment Details Patient Name: Date of Service: [name] [name], [name] 06/21/2021 2:15 PM Medical Record Number:  [id number] Patient Account Number: [id number] Date of Birth/Sex: Treating RN: July 01, 1926 (88 y.o. [name], [name] Primary Care Amori Colomb: [name] Other Clinician: Referring Akaiya Touchette: Treating Rishik Tubby/Extender: [name] in Treatment: 39 Wound Status Wound Number: 10 Primary Trauma, Other Etiology: Wound Location: Right, Anterior Upper Leg Wound Healed - Epithelialized Wounding Event: Trauma Status: Date Acquired: 04/29/2021 Comorbid Cataracts, Sleep Apnea, Arrhythmia, Congestive Heart Failure, Weeks Of Treatment: 7 History: Coronary Artery Disease, Hypertension, Myocardial Infarction, Clustered Wound: No End Stage Renal Disease, Gout, Dementia, Neuropathy Photos Wound Measurements Length: (cm) Width: (cm) Depth: (cm) Area: (cm) Volume: (cm) 0 % Reduction in Area: 100% 0 % Reduction in Volume: 100% 0 Epithelialization: Large (67-100%) 0 Tunneling: No 0 Undermining: No  Wound Description Classification: Full Thickness Without Exposed Support Structures Wound Margin: Flat and Intact Exudate Amount: None Present Foul Odor After Cleansing: No Slough/Fibrino No Wound Bed Granulation Amount: None Present (0%) Exposed Structure Necrotic Amount: None Present (0%) Fascia Exposed: No Fat Layer (Subcutaneous Tissue) Exposed: No Tendon Exposed: No Muscle Exposed: No Joint Exposed: No Bone Exposed: No Electronic Signature(s) Signed: 06/21/2021 4:19:27 PM By: Deon Pilling RN, BSN Entered By: Deon Pilling on 06/21/2021 14:23:38 -------------------------------------------------------------------------------- Wound Assessment Details Patient Name: Date of Service: [name] [name], [name] 06/21/2021 2:15 PM Medical Record Number: [id number] Patient Account Number: [id number] Date of Birth/Sex: Treating RN: 22-Oct-1926 (88 y.o. [name], [name] Primary Care Amorita Vanrossum: [name] Other Clinician: Referring Selinda Korzeniewski: Treating Haru Shaff/Extender:  Eli Hose in Treatment: 25 Wound Status Wound Number: 12 Primary Skin T ear Etiology: Wound Location: Left Forearm Wound Healed - Epithelialized Wounding Event: Skin Tear/Laceration Status: Date Acquired: 05/06/2021 Comorbid Cataracts, Sleep Apnea, Arrhythmia, Congestive Heart Failure, Weeks Of Treatment: 6 History: Coronary Artery Disease, Hypertension, Myocardial Infarction, Clustered Wound: No End Stage Renal Disease, Gout, Dementia, Neuropathy Photos Wound Measurements Length: (cm) Width: (cm) Depth: (cm) Area: (cm) Volume: (cm) 0 % Reduction in Area: 100% 0 % Reduction in Volume: 100% 0 Epithelialization: Large (67-100%) 0 Tunneling: No 0 Undermining: No Wound Description Classification: Full Thickness Without Exposed Support Structures Wound Margin: Distinct, outline attached Exudate Amount: Medium Exudate Type: Serosanguineous Exudate Color: red, brown Foul Odor After Cleansing: No Slough/Fibrino No Wound Bed Granulation Amount: None Present (0%) Exposed Structure Necrotic Amount: None Present (0%) Fascia Exposed: No Fat Layer (Subcutaneous Tissue) Exposed: Yes Tendon Exposed: No Muscle Exposed: No Joint Exposed: No Bone Exposed: No Electronic Signature(s) Signed: 06/21/2021 4:19:27 PM By: Deon Pilling RN, BSN Entered By: Deon Pilling on 06/21/2021 14:22:58 -------------------------------------------------------------------------------- Wound Assessment Details Patient Name: Date of Service: [name] [name], [name] 06/21/2021 2:15 PM Medical Record Number: [id number] Patient Account Number: [id number] Date of Birth/Sex: Treating RN: 1926-11-28 (88 y.o. [name], [name] Primary Care Easten Maceachern: [name] Other Clinician: Referring Sandhya Denherder: Treating Nicol Herbig/Extender: Eli Hose in Treatment: 13 Wound Status Wound Number: 13 Primary Abrasion Etiology: Wound Location: Right T Great oe Wound  Open Wounding Event: Bump Status: Date Acquired: 05/30/2021 Comorbid Cataracts, Sleep Apnea, Arrhythmia, Congestive Heart Failure, Weeks Of Treatment: 0 History: Coronary Artery Disease, Hypertension, Myocardial Infarction, Clustered Wound: No End Stage Renal Disease, Gout, Dementia, Neuropathy Photos Wound Measurements Length: (cm) 1.4 Width: (cm) 0.6 Depth: (cm) 0.1 Area: (cm) 0.66 Volume: (cm) 0.066 % Reduction in Area: % Reduction in Volume: Epithelialization: Small (1-33%) Tunneling: No Undermining: No Wound Description Classification: Full Thickness Without Exposed Support Structures Wound Margin: Distinct, outline attached Exudate Amount: Medium Exudate Type: Serosanguineous Exudate Color: red, brown Foul Odor After Cleansing: No Slough/Fibrino No Wound Bed Granulation Amount: Large (67-100%) Exposed Structure Granulation Quality: Red Fascia Exposed: No Necrotic Amount: None Present (0%) Fat Layer (Subcutaneous Tissue) Exposed: Yes Tendon Exposed: No Muscle Exposed: No Joint Exposed: No Bone Exposed: No Treatment Notes Wound #13 (Toe Great) Wound Laterality: Right Cleanser Soap and Water Discharge Instruction: May shower and wash wound with dial antibacterial soap and water prior to dressing change. Peri-Wound Care Topical Primary Dressing Xeroform Occlusive Gauze Dressing, 4x4 in Discharge Instruction: Apply to wound bed as instructed Secondary Dressing Woven Gauze Sponges 2x2 in Discharge Instruction: Apply over primary dressing as directed. Secured With Conforming Stretch Gauze Bandage, Sterile 2x75 (in/in) Discharge Instruction:  Secure with stretch gauze as directed. Paper Tape, 2x10 (in/yd) Discharge Instruction: Secure dressing with tape as directed. Compression Wrap Compression Stockings Add-Ons Electronic Signature(s) Signed: 06/21/2021 4:19:27 PM By: Deon Pilling RN, BSN Entered By: Deon Pilling on 06/21/2021  14:28:26 -------------------------------------------------------------------------------- Wound Assessment Details Patient Name: Date of Service: [name] [name], [name] 06/21/2021 2:15 PM Medical Record Number: [id number] Patient Account Number: [id number] Date of Birth/Sex: Treating RN: Mar 26, 1926 (88 y.o. [name], [name] Primary Care [name]: [name] Other Clinician: Referring Simmie Camerer: Treating Amedeo Detweiler/Extender: Eli Hose in Treatment: 39 Wound Status Wound Number: 3 Primary Venous Leg Ulcer Etiology: Wound Location: Left, Lateral Ankle Wound Open Wounding Event: Gradually Appeared Status: Date Acquired: 10/12/2020 Comorbid Cataracts, Sleep Apnea, Arrhythmia, Congestive Heart Failure, Weeks Of Treatment: 31 History: Coronary Artery Disease, Hypertension, Myocardial Infarction, Clustered Wound: No End Stage Renal Disease, Gout, Dementia, Neuropathy Photos Wound Measurements Length: (cm) 0.9 Width: (cm) 0.8 Depth: (cm) 0.3 Area: (cm) 0.565 Volume: (cm) 0.17 % Reduction in Area: -12.3% % Reduction in Volume: -12.6% Epithelialization: Medium (34-66%) Tunneling: No Undermining: No Wound Description Classification: Full Thickness Without Exposed Support Structures Wound Margin: Epibole Exudate Amount: Medium Exudate Type: Serosanguineous Exudate Color: red, brown Foul Odor After Cleansing: No Slough/Fibrino Yes Wound Bed Granulation Amount: Large (67-100%) Exposed Structure Granulation Quality: Red Fascia Exposed: No Necrotic Amount: Small (1-33%) Fat Layer (Subcutaneous Tissue) Exposed: Yes Necrotic Quality: Adherent Slough Tendon Exposed: No Muscle Exposed: No Joint Exposed: No Bone Exposed: No Treatment Notes Wound #3 (Ankle) Wound Laterality: Left, Lateral Cleanser Soap and Water Discharge Instruction: May shower and wash wound with dial antibacterial soap and water prior to dressing change. Peri-Wound  Care Topical Primary Dressing Promogran Prisma Matrix, 4.34 (sq in) (silver collagen) Discharge Instruction: Moisten collagen with hydrogel or KY jelly. Secondary Dressing Woven Gauze Sponge, Non-Sterile 4x4 in Discharge Instruction: Apply over primary dressing as directed. Zetuvit Plus Silicone Border Dressing 4x4 (in/in) Discharge Instruction: Apply silicone border over primary dressing as directed. Secured With Compression Wrap Compression Stockings Environmental education officer) Signed: 06/21/2021 4:19:27 PM By: Deon Pilling RN, BSN Entered By: Deon Pilling on 06/21/2021 14:25:49 -------------------------------------------------------------------------------- Wound Assessment Details Patient Name: Date of Service: [name] [name], [name] 06/21/2021 2:15 PM Medical Record Number: [id number] Patient Account Number: [id number] Date of Birth/Sex: Treating RN: 05-22-1926 (88 y.o. [name], [name] Primary Care Myrta Mercer: [name] Other Clinician: Referring Simeon Vera: Treating Dawnisha Marquina/Extender: Eli Hose in Treatment: 39 Wound Status Wound Number: 6 Primary Arterial Insufficiency Ulcer Etiology: Wound Location: Right, Lateral Ankle Wound Open Wounding Event: Gradually Appeared Status: Date Acquired: 11/22/2020 Comorbid Cataracts, Sleep Apnea, Arrhythmia, Congestive Heart Failure, Weeks Of Treatment: 26 History: Coronary Artery Disease, Hypertension, Myocardial Infarction, Clustered Wound: No Clustered Wound: No End Stage Renal Disease, Gout, Dementia, Neuropathy Photos Wound Measurements Length: (cm) 1.3 Width: (cm) 0.7 Depth: (cm) 0.4 Area: (cm) 0.715 Volume: (cm) 0.286 % Reduction in Area: -160% % Reduction in Volume: -108.8% Epithelialization: Small (1-33%) Tunneling: No Undermining: No Wound Description Classification: Full Thickness Without Exposed Support Structures Wound Margin: Distinct, outline attached Exudate Amount:  Medium Exudate Type: Serosanguineous Exudate Color: red, brown Foul Odor After Cleansing: No Slough/Fibrino Yes Wound Bed Granulation Amount: Medium (34-66%) Exposed Structure Granulation Quality: Pink Fascia Exposed: No Necrotic Amount: Medium (34-66%) Fat Layer (Subcutaneous Tissue) Exposed: Yes Necrotic Quality: Adherent Slough Tendon Exposed: No Muscle Exposed: No Joint Exposed: No Bone Exposed: No Treatment Notes Wound #6 (Ankle) Wound Laterality:  Right, Lateral Cleanser Soap and Water Discharge Instruction: May shower and wash wound with dial antibacterial soap and water prior to dressing change. Peri-Wound Care Topical Primary Dressing Promogran Prisma Matrix, 4.34 (sq in) (silver collagen) Discharge Instruction: Moisten collagen with hydrogel or KY jelly. Secondary Dressing Woven Gauze Sponge, Non-Sterile 4x4 in Discharge Instruction: Apply over primary dressing as directed. Zetuvit Plus Silicone Border Dressing 4x4 (in/in) Discharge Instruction: Apply silicone border over primary dressing as directed. Secured With Compression Wrap Compression Stockings Environmental education officer) Signed: 06/21/2021 4:19:27 PM By: Deon Pilling RN, BSN Entered By: Deon Pilling on 06/21/2021 14:26:52 -------------------------------------------------------------------------------- Vitals Details Patient Name: Date of Service: [name] [name], [name] 06/21/2021 2:15 PM Medical Record Number: [id number] Patient Account Number: [id number] Date of Birth/Sex: Treating RN: [birth year] (88 y.o. [name] Primary Care Celso Granja: [name] Other Clinician: Referring Aleyna Cueva: Treating Latania Bascomb/Extender: Eli Hose in Treatment: 39 Vital Signs Time Taken: 14:10 Temperature (F): 98 Height (in): 72 Pulse (bpm): 74 Weight (lbs): 223 Respiratory Rate (breaths/min): 20 Body Mass Index (BMI): 30.2 Blood Pressure (mmHg): 147/74 Reference Range: 80 -  120 mg / dl Electronic Signature(s) Signed: 06/21/2021 4:19:27 PM By: Deon Pilling RN, BSN Entered By: Deon Pilling on 06/21/2021 14:16:19

## 2021-06-21 NOTE — Progress Notes (Signed)
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 been having with his bilateral lower extremities in particular. He in fact still has an issue with his right heel. This appears to be a stage III pressure ulcer at this point there is some slough noted. Subsequently he does have revascularization that is taking place on the right leg as a follow-up later this month in that regard. No repeat ABIs. Subsequently the patient also has venous stasis bilaterally which is also problematic for him at this time. He has some ulcers on the left ankle and left leg that are getting need to be addressed as well. 12/21/2020 upon evaluation today patient appears to be doing poorly in regard to his bilateral lower extremity ulcers. He still has areas on both lower extremities currently ineffective even some areas that were not there previous. Fortunately there is no signs of active  infection at this time. No fevers, chills, nausea, vomiting, or diarrhea. 01/04/21 upon evaluation today patient actually appears to be doing quite well in regard to his wounds. Fortunately there does not appear to be any signs of active infection which is great. There is some necrotic tissue that I think would be helpful to debride away pretty much on all wounds other than the heel which appears to be doing quite well. 01/25/2021 upon evaluation today patient appears to be doing okay in regard to his wounds. There are some measurements that are definitely smaller which is good news. Fortunately I do not see any evidence of active infection locally nor systemically at this point which is also great news.  He does have a deep tissue injury which seems to be getting a bit worse on the lateral portion of his right heel. He tells me is not been using the bunny boots and he has not been using a pillow under his calves either. Nonetheless I think this is absolutely a pressure injury and I am very concerned about this. Also I do not want this to get a lot worse. He also tells me his hemoglobin is around 7 he is going to be seeing hematology for a transfusion either today or tomorrow most likely this will definitely help with his healing. 02/01/2021 upon evaluation today patient actually appears to be making excellent progress in regard to his wound currently. Fortunately I feel like that all the wounds are showing signs of improvement the Iodoflex seems to be doing an awesome job. I am very pleased with where we stand today. There does not appear to be any signs of active infection. 02/22/2021 upon evaluation today patient appears to be doing well with regard to his wound. He has been tolerating the dressing changes without complication. Fortunately there does not appear to be any evidence of active infection locally or systemically at this point which is good news. Overall I think that we are headed in the right  direction and the Iodoflex is doing a good job. 03/08/2021 upon evaluation today patient appears to be doing well all things considered with regard to his wounds. He has been tolerating the dressing changes without complication. Fortunately there does not appear to be any evidence of active infection locally nor systemically which is great news overall I think the Iodosorb is doing an awesome job here. 03/22/2021 upon evaluation today patient appears to be doing well with regard to his wounds for the most part. The biggest issue is that he does have a new wound on his gluteal region really more sacral than anything to be honest. Unfortunately this is going to be something that we need to address sooner rather than later. It does not appear to be too bad right now but obviously things can get significantly worse. 04-05-2021 upon evaluation today patient actually appears to be doing well in general in regard to his wounds. He does have an area growing on the left anterior shin which is somewhat concerning in fact I am wondering about the possibility of even a basal cell carcinoma. With that being said I did discuss this with the patient and his wife today I think that a biopsy may be the ideal thing to do. 04-19-2021 upon evaluation today patient's wounds are actually showing signs of improvement. The biopsy report I did review which showed squamous cell carcinoma we have actually referred him to dermatology he has a appointment with the skin surgery center which is actually for the beginning of May. With that being said I think that is absolutely appropriate obviously the sooner that he can get this done the better. Overall I am extremely pleased with how things are going and how his wounds are improving. 05-03-2021 upon evaluation today patient's original wounds actually appear to be doing better he does have his upcoming appointment with a Mohs surgeon and hopefully they will be able to get this taken care  of as far as that wound is concerned. With that being said with regard to his other wounds I do believe the collagen is doing quite well. Unfortunately he did get a new motorized wheelchair and got this to close to the edge of a  curb and actually had a fall where he was thrown from it. This happens because to areas of skin tear 1 over the right thigh and 1 in the right upper arm both of which are still showing some signs of bleeding weeping this happened on Saturday. The patient obviously is in good spirits but nonetheless has a little frustrated with that situation. 05-10-2021 upon evaluation today patient appears to be doing well with regard to his lower extremity ulcers all of which are either healed or doing better. Unfortunately in regard to his wound on the right thigh this is what has been the most concern. I am afraid that this hematoma may actually end up opening into a deeper wound. This is something we can have to keep a close eye on. With that being said I do believe that the right arm is healed and the left arm does exhibit issues here with a skin tear currently which again I do believe is new he hit this in the bathroom this past Saturday. 05-17-2021 upon evaluation today patient appears to be doing well with regard to his wounds. I am actually extremely pleased to see that the wound on his right thigh does not appear to be opening into a much deeper wound. The area of eschar did come off but it appears to be superficial right now hoping it stays this way. Fortunately I do not see any evidence of infection which is great news about the wounds on the lower extremity right and left appear to be doing awesome. There is can be a need for a light sharp debridement at both locations today. 06-21-2021 upon evaluation today patient appears to be doing well all things considered. He did have a stroke and subsequently was in the hospital for a time that is why I have not seen him since May 17. The  good news is nothing is worse and he seems to be making good progress here with his wounds he also had surgery at the skin surgery center and though there is some slough and biofilm buildup on the surface of this wound he is definitely seeing some improvement with granulation tissue here and very pleased in that regard. Patient History Information obtained from Patient, Chart. Family History No family history of Cancer, Diabetes, Heart Disease, Hereditary Spherocytosis, Hypertension, Kidney Disease, Lung Disease, Seizures, Stroke, Thyroid Problems, Tuberculosis. Social History Former smoker - quit 1964, Marital Status - Married, Alcohol Use - Daily, Drug Use - No History, Caffeine Use - Daily - coffee. Medical History Eyes Patient has history of Cataracts - 1995 Denies history of Glaucoma, Optic Neuritis Ear/Nose/Mouth/Throat Denies history of Chronic sinus problems/congestion, Middle ear problems Hematologic/Lymphatic Denies history of Anemia, Hemophilia, Human Immunodeficiency Virus, Lymphedema, Sickle Cell Disease Respiratory Patient has history of Sleep Apnea - CPAP Denies history of Aspiration, Asthma, Chronic Obstructive Pulmonary Disease (COPD), Pneumothorax, Tuberculosis Cardiovascular Patient has history of Arrhythmia - A. Fib, Congestive Heart Failure, Coronary Artery Disease, Hypertension, Myocardial Infarction - 2010 Denies history of Angina, Deep Vein Thrombosis, Hypotension, Peripheral Arterial Disease, Peripheral Venous Disease, Phlebitis, Vasculitis Gastrointestinal Denies history of Cirrhosis , Colitis, Crohnoos, Hepatitis A, Hepatitis B, Hepatitis C Endocrine Denies history of Type I Diabetes, Type II Diabetes Genitourinary Patient has history of End Stage Renal Disease - stage III Immunological Denies history of Lupus Erythematosus, Raynaudoos, Scleroderma Integumentary (Skin) Denies history of History of Burn Musculoskeletal Patient has history of Gout Denies  history of Rheumatoid Arthritis, Osteoarthritis, Osteomyelitis Neurologic Patient has history of Dementia,  Neuropathy Denies history of Quadriplegia, Paraplegia, Seizure Disorder Oncologic Denies history of Received Chemotherapy, Received Radiation Psychiatric Denies history of Anorexia/bulimia, Confinement Anxiety Hospitalization/Surgery History - 11/2020 Aortic valve replacement. - triple BYpass 2010. - FAll L1 vertebra surgery 8 months ago. - Fall 13month ago T7-T8. - vastectomy 1972. - partial prostate removal 2008. - UTI inpatient 03/14/2021. - CVA 05/31/2021-06/14/2021. Medical A Surgical History Notes nd Ear/Nose/Mouth/Throat HOH- hearing aids Genitourinary foley catheter unable to urinate x2 months ago Oncologic melanoma skin Ca removal x2 weeks ago back Objective Constitutional Well-nourished and well-hydrated in no acute distress. Vitals Time Taken: 2:10 PM, Height: 72 in, Weight: 223 lbs, BMI: 30.2, Temperature: 98 F, Pulse: 74 bpm, Respiratory Rate: 20 breaths/min, Blood Pressure: 147/74 mmHg. Respiratory normal breathing without difficulty. Psychiatric this patient is able to make decisions and demonstrates good insight into disease process. Alert and Oriented x 3. pleasant and cooperative. General Notes: Upon inspection patient's wound bed actually showed signs of good granulation and epithelization at this point. Fortunately I see no evidence of infection at this time and overall I think that he is doing very well his heels are still intact. Integumentary (Hair, Skin) Wound #10 status is Healed - Epithelialized. Original cause of wound was Trauma. The date acquired was: 04/29/2021. The wound has been in treatment 7 weeks. The wound is located on the Right,Anterior Upper Leg. The wound measures 0cm length x 0cm width x 0cm depth; 0cm^2 area and 0cm^3 volume. There is no tunneling or undermining noted. There is a none present amount of drainage noted. The wound margin is  flat and intact. There is no granulation within the wound bed. There is no necrotic tissue within the wound bed. Wound #12 status is Healed - Epithelialized. Original cause of wound was Skin T ear/Laceration. The date acquired was: 05/06/2021. The wound has been in treatment 6 weeks. The wound is located on the Left Forearm. The wound measures 0cm length x 0cm width x 0cm depth; 0cm^2 area and 0cm^3 volume. There is Fat Layer (Subcutaneous Tissue) exposed. There is no tunneling or undermining noted. There is a medium amount of serosanguineous drainage noted. The wound margin is distinct with the outline attached to the wound base. There is no granulation within the wound bed. There is no necrotic tissue within the wound bed. Wound #13 status is Open. Original cause of wound was Bump. The date acquired was: 05/30/2021. The wound is located on the Right T Great. The wound oe measures 1.4cm length x 0.6cm width x 0.1cm depth; 0.66cm^2 area and 0.066cm^3 volume. There is Fat Layer (Subcutaneous Tissue) exposed. There is no tunneling or undermining noted. There is a medium amount of serosanguineous drainage noted. The wound margin is distinct with the outline attached to the wound base. There is large (67-100%) red granulation within the wound bed. There is no necrotic tissue within the wound bed. Wound #3 status is Open. Original cause of wound was Gradually Appeared. The date acquired was: 10/12/2020. The wound has been in treatment 31 weeks. The wound is located on the Left,Lateral Ankle. The wound measures 0.9cm length x 0.8cm width x 0.3cm depth; 0.565cm^2 area and 0.17cm^3 volume. There is Fat Layer (Subcutaneous Tissue) exposed. There is no tunneling or undermining noted. There is a medium amount of serosanguineous drainage noted. The wound margin is epibole. There is large (67-100%) red granulation within the wound bed. There is a small (1-33%) amount of necrotic tissue within the wound bed including  Adherent Slough. Wound #6  status is Open. Original cause of wound was Gradually Appeared. The date acquired was: 11/22/2020. The wound has been in treatment 26 weeks. The wound is located on the Right,Lateral Ankle. The wound measures 1.3cm length x 0.7cm width x 0.4cm depth; 0.715cm^2 area and 0.286cm^3 volume. There is Fat Layer (Subcutaneous Tissue) exposed. There is no tunneling or undermining noted. There is a medium amount of serosanguineous drainage noted. The wound margin is distinct with the outline attached to the wound base. There is medium (34-66%) pink granulation within the wound bed. There is a medium (34-66%) amount of necrotic tissue within the wound bed including Adherent Slough. 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 There was a Chemical/Enzymatic/Mechanical debridement performed by Norlene Duel, RN.. Other agent used was gauze and wound cleanser. A Minimum amount of bleeding was controlled with Pressure. The procedure was tolerated well. Post Debridement Measurements: 1.4cm length x 0.6cm width x 0.1cm depth; 0.066cm^3 volume. Character of Wound/Ulcer Post  Debridement is stable. Post procedure Diagnosis Wound #13: Same as Pre-Procedure Plan Follow-up Appointments: Return Appointment in 2 weeks. [facility], Room 8 Wednesday 07/05/2021 1245 Other: - [employer]- supplies. Bathing/ Shower/ Hygiene: May shower and wash wound with soap and water. - with dressing changes. Edema Control - Lymphedema / SCD / Other: Lymphedema Pumps. Use Lymphedema pumps on leg(s) 2-3 times a day for 45-60 minutes. If wearing any wraps or hose, do not remove them. Continue exercising as instructed. - 2 times a day for an hour each time. Will order from [place]. Elevate legs to the level of the heart or above for 30 minutes daily and/or when sitting, a frequency of: - throughout the day. Avoid standing for long periods of time. Moisturize legs daily. - both legs and feet with dressing changes. Compression stocking or Garment 10-20 mm/Hg pressure to: - both legs apply in the morning and remove at night. Off-Loading: Low air-loss mattress (Group 2) - Facility to provide for patient to use to aid in offloading feet and sacral wounds. Other: - bunny boots to both feet while resting in bed or chair. If unable to use bunny boots use pillow to elevate both legs to offload pressure to heels. Ensure no pressure to wounds to aid in healing. Additional Orders / Instructions: Other: - May continue to walk and work with physical therapy. WOUND #13: - T Great Wound Laterality: Right oe Cleanser: Soap and Water Every Other Day/30 Days Discharge Instructions: May shower and wash wound with dial antibacterial soap and water prior to dressing change. Prim Dressing: Xeroform Occlusive Gauze Dressing, 4x4 in (DME) (Generic) Every Other Day/30 Days ary Discharge Instructions: Apply to wound bed as instructed Secondary Dressing: Woven Gauze Sponges 2x2 in (DME) (Generic) Every Other Day/30 Days Discharge Instructions: Apply over primary dressing as  directed. Secured With: Child psychotherapist, Sterile 2x75 (in/in) (DME) (Generic) Every Other Day/30 Days Discharge Instructions: Secure with stretch gauze as directed. Secured With: Paper T ape, 2x10 (in/yd) (DME) (Generic) Every Other Day/30 Days Discharge Instructions: Secure dressing with tape as directed. WOUND #3: - Ankle Wound Laterality: Left, Lateral Cleanser: Soap and Water Every Other Day/30 Days Discharge Instructions: May shower and wash wound with dial antibacterial soap and water prior to dressing change. Prim Dressing: Promogran Prisma Matrix, 4.34 (sq in) (silver collagen) (Generic) Every Other Day/30 Days ary Discharge Instructions: Moisten collagen with hydrogel or KY jelly. Secondary Dressing: Woven Gauze Sponge, Non-Sterile 4x4 in (Generic) Every Other Day/30 Days Discharge Instructions: Apply over primary dressing as directed. Secondary Dressing: Zetuvit Plus Silicone Border Dressing 4x4 (in/in) (DME) (Generic) Every Other Day/30 Days Discharge Instructions: Apply silicone border over primary dressing as directed. WOUND #6: - Ankle Wound Laterality: Right, Lateral Cleanser: Soap and Water Every Other Day/30 Days Discharge Instructions: May shower and wash wound with dial antibacterial soap and water prior to dressing change. Prim Dressing: Promogran Prisma Matrix, 4.34 (sq in) (silver collagen) (Generic) Every Other Day/30 Days ary Discharge Instructions: Moisten collagen with hydrogel or KY jelly. Secondary Dressing: Woven Gauze Sponge, Non-Sterile 4x4 in (Generic) Every Other Day/30 Days Discharge Instructions: Apply over primary dressing as directed. Secondary Dressing: Zetuvit Plus Silicone Border Dressing 4x4 (in/in) (DME) (Generic) Every Other Day/30 Days Discharge Instructions: Apply silicone border over primary dressing as directed. 1. I am going to suggest that we go ahead and continue with the wound care measures as before and the patient is in  agreement with the plan. This includes the use of the silver collagen dressing to the wounds on his legs I think this is good to be the best way to go. 2. I am also can recommend that we have the patient continue to monitor for any signs of worsening or infection. Obviously if anything changes he knows to contact the office his wife takes very good care of him however. We will see patient back for reevaluation in 2 weeks here in the clinic. If anything worsens or changes patient will contact our office for additional recommendations. Electronic Signature(s) Signed: 06/21/2021 4:49:35 PM By: [name] PA-C Entered By: [name] on 06/21/2021 16:49:34 -------------------------------------------------------------------------------- HxROS Details Patient Name: Date of Service: [name], [name] 06/21/2021 2:15 PM Medical Record Number: [id number] Patient Account Number: [id number] Date of Birth/Sex: Treating RN: Nov 27, 1926 (88 y.o. [name] [name] Primary Care Provider: [name] Other Clinician: Referring Provider: Treating Provider/Extender: Eli Hose in Treatment: 75 Information Obtained From Patient Chart Eyes Medical History: Positive for: Cataracts - 1995 Negative for: Glaucoma; Optic Neuritis Ear/Nose/Mouth/Throat Medical History: Negative for: Chronic sinus problems/congestion; Middle ear problems Past Medical History Notes: HOH- hearing aids Hematologic/Lymphatic Medical History: Negative for: Anemia; Hemophilia; Human Immunodeficiency Virus; Lymphedema; Sickle Cell Disease Respiratory Medical History: Positive for: Sleep Apnea - CPAP Negative for: Aspiration; Asthma; Chronic Obstructive Pulmonary Disease (COPD); Pneumothorax; Tuberculosis Cardiovascular Medical History: Positive for: Arrhythmia - A. Fib; Congestive Heart Failure; Coronary Artery Disease; Hypertension; Myocardial Infarction - 2010 Negative for: Angina; Deep Vein  Thrombosis; Hypotension; Peripheral Arterial Disease; Peripheral Venous Disease; Phlebitis; Vasculitis Gastrointestinal Medical History: Negative for: Cirrhosis ; Colitis; Crohns; Hepatitis A; Hepatitis B; Hepatitis C Endocrine Medical History: Negative for: Type I Diabetes; Type II Diabetes Genitourinary Medical History: Positive for: End Stage Renal Disease - stage III Past Medical History  Notes: foley catheter unable to urinate x2 months ago Immunological Medical History: Negative for: Lupus Erythematosus; Raynauds; Scleroderma Integumentary (Skin) Medical History: Negative for: History of Burn Musculoskeletal Medical History: Positive for: Gout Negative for: Rheumatoid Arthritis; Osteoarthritis; Osteomyelitis Neurologic Medical History: Positive for: Dementia; Neuropathy Negative for: Quadriplegia; Paraplegia; Seizure Disorder Oncologic Medical History: Negative for: Received Chemotherapy; Received Radiation Past Medical History Notes: melanoma skin Ca removal x2 weeks ago back Psychiatric Medical History: Negative for: Anorexia/bulimia; Confinement Anxiety HBO Extended History Items Eyes: Cataracts Immunizations Pneumococcal Vaccine: Received Pneumococcal Vaccination: Yes Received Pneumococcal Vaccination On or After 60th Birthday: Yes Implantable Devices Yes Hospitalization / Surgery History Type of Hospitalization/Surgery 11/2020 Aortic valve replacement triple BYpass 2010 FAll L1 vertebra surgery 8 months ago Fall 42month ago T7-T8 vastectomy 1972 partial prostate removal 2008 UTI inpatient 03/14/2021 CVA 05/31/2021-06/14/2021 Family and Social History Cancer: No; Diabetes: No; Heart Disease: No; Hereditary Spherocytosis: No; Hypertension: No; Kidney Disease: No; Lung Disease: No; Seizures: No; Stroke: No; Thyroid Problems: No; Tuberculosis: No; Former smoker - quit 1964; Marital Status - Married; Alcohol Use: Daily; Drug Use: No History; Caffeine Use:  Daily - coffee; Financial Concerns: No; Food, Clothing or Shelter Needs: No; Support System Lacking: No; Transportation Concerns: No Electronic Signature(s) Signed: 06/21/2021 4:19:27 PM By: DDeon PillingRN, BSN Signed: 06/21/2021 5:09:34 PM By: SWorthy KeelerPA-C Entered By: DDeon Pillingon 06/21/2021 14:16:04 -------------------------------------------------------------------------------- SuperBill Details Patient Name: Date of Service: CMarylee [name] [name] 06/21/2021 Medical Record Number: [id number] Account Number: [id number] of Birth/Sex: Treating RN: [id number](88y.o. MLorette Ang BMeta.RedingPrimary Care Provider: PJerlyn LyOther Clinician: Referring Provider: Treating Provider/Extender: SEli Hosein Treatment: 39 Diagnosis Coding ICD-10 Codes Code Description [phone] ulcer of right heel, stage 3 I73.89 Other specified peripheral vascular diseases I87.2 Venous insufficiency (chronic) (peripheral) L97.822 Non-pressure chronic ulcer of other part of left lower leg with fat layer exposed L97.312 Non-pressure chronic ulcer of right ankle with fat layer exposed L89.153 Pressure ulcer of sacral region, stage 3 L98.8 Other specified disorders of the skin and subcutaneous tissue N40.1 Benign prostatic hyperplasia with lower urinary tract symptoms N18.30 Chronic kidney disease, stage 3 unspecified I10 Essential (primary) hypertension S71.111D Laceration without foreign body, right thigh, subsequent encounter S41.111D Laceration without foreign body of right upper arm, subsequent encounter S41.112A Laceration without foreign body of left upper arm, initial encounter Facility Procedures CPT4 Code: [id number] Description: 9213 - WOUND CARE VISIT-LEV 3 EST PT Modifier: Quantity: 1 CPT4 Code: [id number] Description: 7602 - DEBRIDE W/O ANES NON SELECT Modifier: Quantity: 1 Physician Procedures : CPT4 Code Description Modifier [id number] 99213 - WC PHYS LEVEL 3 - EST PT ICD-10 Diagnosis Description L89.613 Pressure ulcer of right heel, stage 3 I73.89 Other specified peripheral vascular diseases I87.2 Venous insufficiency (chronic) (peripheral)  L97.822 Non-pressure chronic ulcer of other part of left lower leg with fat layer exposed Quantity: 1 Electronic Signature(s) Signed: 06/21/2021 4:49:58 PM By: SWorthy KeelerPA-C Previous Signature: 06/21/2021 4:19:27 PM Version By: DDeon PillingRN, BSN Entered By: SWorthy Keeleron 06/21/2021 16:49:58

## 2021-06-23 DIAGNOSIS — L89623 Pressure ulcer of left heel, stage 3: Secondary | ICD-10-CM | POA: Diagnosis not present

## 2021-06-23 DIAGNOSIS — L89613 Pressure ulcer of right heel, stage 3: Secondary | ICD-10-CM | POA: Diagnosis not present

## 2021-06-23 DIAGNOSIS — L97822 Non-pressure chronic ulcer of other part of left lower leg with fat layer exposed: Secondary | ICD-10-CM | POA: Diagnosis not present

## 2021-06-23 DIAGNOSIS — S91309A Unspecified open wound, unspecified foot, initial encounter: Secondary | ICD-10-CM | POA: Diagnosis not present

## 2021-06-26 DIAGNOSIS — I4891 Unspecified atrial fibrillation: Secondary | ICD-10-CM | POA: Diagnosis not present

## 2021-06-26 DIAGNOSIS — G4733 Obstructive sleep apnea (adult) (pediatric): Secondary | ICD-10-CM | POA: Diagnosis not present

## 2021-06-26 DIAGNOSIS — I5021 Acute systolic (congestive) heart failure: Secondary | ICD-10-CM | POA: Diagnosis not present

## 2021-06-26 DIAGNOSIS — I259 Chronic ischemic heart disease, unspecified: Secondary | ICD-10-CM | POA: Diagnosis not present

## 2021-06-29 ENCOUNTER — Telehealth (HOSPITAL_BASED_OUTPATIENT_CLINIC_OR_DEPARTMENT_OTHER): Payer: Self-pay

## 2021-06-29 ENCOUNTER — Other Ambulatory Visit (HOSPITAL_COMMUNITY): Payer: Self-pay

## 2021-06-29 NOTE — Telephone Encounter (Signed)
Pharmacy Transitions of Care Follow-up Telephone Call  Date of discharge: 06/14/21  Discharge Diagnosis: Stroke  How have you been since you were released from the hospital? Patient is doing well, but is having difficulty speaking, spelling, and writing, but is seeing PT and OT. No questions or concerns about his medications.  Medication changes made at discharge: START taking these medications  START taking these medications  multivitamin with minerals Tabs tablet Take 1 tablet by mouth daily.  potassium chloride 10 MEQ tablet Commonly known as: KLOR-CON M Take 1 tablet by mouth everyday with furosemide Replaces: potassium chloride 10 MEQ CR capsule  tamsulosin 0.4 MG Caps capsule Commonly known as: FLOMAX Take 1 capsule (0.4 mg total) by mouth daily after supper.  vitamin C 500 MG tablet Commonly known as: ASCORBIC ACID Take 1 tablet (500 mg total) by mouth daily. Replaces: Vitamin C 500 MG Caps   CHANGE how you take these medications  CHANGE how you take these medications  acetaminophen 325 MG tablet Commonly known as: TYLENOL Take 2 tablets (650 mg total) by mouth every 4 (four) hours as needed for mild pain (or temp > 37.5 C (99.5 F)). What changed:  medication strength how much to take when to take this reasons to take this additional instructions  docusate sodium 100 MG capsule Commonly known as: COLACE Take 1 capsule (100 mg total) by mouth 2 (two) times daily. What changed: when to take this  gabapentin 100 MG capsule Commonly known as: NEURONTIN Take 2 capsules by mouth in the morning, 1 capsule in the evening, and 2 capsules at bedtime. What changed: additional instructions  galantamine 8 MG 24 hr capsule Commonly known as: RAZADYNE ER Take 1 capsule (8 mg total) by mouth daily with breakfast. What changed: when to take this  isosorbide mononitrate 60 MG 24 hr tablet Commonly known as: IMDUR Take 1 tablet (60 mg total) by mouth daily. What changed: See  the new instructions.  predniSONE 1 MG tablet Commonly known as: DELTASONE Take 3 tablets (3 mg total) by mouth daily with breakfast. What changed: additional instructions   CONTINUE taking these medications  CONTINUE taking these medications  amiodarone 200 MG tablet Commonly known as: PACERONE Take 1 tablet (200 mg total) by mouth daily.  atorvastatin 40 MG tablet Commonly known as: LIPITOR Take 1 tablet (40 mg total) by mouth daily.  cadexomer iodine 0.9 % gel Commonly known as: IODOSORB  denosumab 60 MG/ML Sosy injection Commonly known as: PROLIA  Eliquis 2.5 MG Tabs tablet Generic drug: apixaban Take 1 tablet (2.5 mg total) by mouth 2 (two) times daily.  ezetimibe 10 MG tablet Commonly known as: ZETIA Take 1 tablet (10 mg total) by mouth daily.  finasteride 5 MG tablet Commonly known as: PROSCAR Take 1 tablet (5 mg total) by mouth daily.  fish oil-omega-3 fatty acids 1000 MG capsule Take 1 capsule (1 g total) by mouth daily.  leflunomide 20 MG tablet Commonly known as: ARAVA Take 1/2 tablets (10 mg total) by mouth daily.  lidocaine 5 % Commonly known as: LIDODERM Place 1 patch onto the skin daily. Remove & Discard patch within 12 hours or as directed by MD  Magnesium 250 MG Tabs Take 1 tablet (250 mg total) by mouth daily.  memantine 10 MG tablet Commonly known as: NAMENDA Take 1 tablet (10 mg total) by mouth 2 (two) times daily.  naftifine 1 % cream Commonly known as: NAFTIN  nitroGLYCERIN 0.4 MG SL tablet Commonly known as: Nitrostat  Place 1 tablet (0.4 mg total) under the tongue every 5 (five) minutes x 3 doses as needed for chest pain.  pantoprazole 40 MG tablet Commonly known as: PROTONIX Take 1 tablet (40 mg total) by mouth every other day.  Psyllium Husk Powd  torsemide 20 MG tablet Commonly known as: DEMADEX Take 2 tablets (40 mg total) by mouth daily.  traMADol 50 MG tablet Commonly known as: ULTRAM Take 1/2 tablets (25 mg total) by mouth every 6  (six) hours as needed for moderate pain.  Vitamin B-12 5000 MCG Tbdp Take 5,000 mcg by mouth daily.  Vitamin D3 25 MCG tablet Commonly known as: Vitamin D Take 1 tablet (1,000 Units total) by mouth daily.   STOP taking these medications  STOP taking these medications  EPINEPHrine 0.3 mg/0.3 mL Soaj injection Commonly known as: EPI-PEN  feeding supplement Liqd  hydrALAZINE 25 MG tablet Commonly known as: APRESOLINE  metoprolol succinate 25 MG 24 hr tablet Commonly known as: TOPROL-XL  potassium chloride 10 MEQ CR capsule Commonly known as: MICRO-K Replaced by: potassium chloride 10 MEQ tablet  Vitamin C 500 MG Caps Replaced by: vitamin C 500 MG tablet    Medication changes verified by the patient? Yes    Medication Accessibility:  Home Pharmacy: [facility]   Was the patient provided with refills on discharged medications? no   Have all prescriptions been transferred from [facility] to home pharmacy? N/a   Is the patient able to afford medications? insured Notable copays: $2-60 Eligible patient assistance: no    Medication Review: APIXABAN (ELIQUIS)  Apixaban 2.5 mg BID  - Discussed importance of taking medication around the same time everyday  - Reviewed potential DDIs with patient  - Advised patient of medications to avoid (NSAIDs, ASA)  - Educated that Tylenol (acetaminophen) will be the preferred analgesic to prevent risk of bleeding  - Emphasized importance of monitoring for signs and symptoms of bleeding (abnormal bruising, prolonged bleeding, nose bleeds, bleeding from gums, discolored urine, black tarry stools)  - Advised patient to alert all providers of anticoagulation therapy prior to starting a new medication or having a procedure   Follow-up Appointments: Date Visit Type Length Department    07/03/2021 10:40 AM TRANSITIONAL CARE 30 40 min [facility] [[id number]]  Patient Instructions:            07/05/2021 12:45  PM WOUND CARE 45 45 min [facility] [place] [[id number]]  Patient Instructions:            07/17/2021  1:30 PM ESI 30 30 min [place] [[id number]]    If their condition worsens, is the pt aware to call PCP or go to the Emergency Dept.? Yes  Final Patient Assessment: Patient is doing well, but is having difficulty speaking, spelling, and writing, but is seeing PT and OT. No questions or concerns about his medications.  Darcus Austin, [facility] 06/29/2021 10:13 AM

## 2021-07-03 ENCOUNTER — Encounter: Payer: Self-pay | Admitting: Registered Nurse

## 2021-07-03 ENCOUNTER — Encounter: Payer: Medicare Other | Attending: Registered Nurse | Admitting: Registered Nurse

## 2021-07-03 VITALS — BP 136/64 | HR 62 | Ht 72.0 in

## 2021-07-03 DIAGNOSIS — I63512 Cerebral infarction due to unspecified occlusion or stenosis of left middle cerebral artery: Secondary | ICD-10-CM | POA: Diagnosis not present

## 2021-07-03 DIAGNOSIS — I4891 Unspecified atrial fibrillation: Secondary | ICD-10-CM | POA: Diagnosis not present

## 2021-07-03 DIAGNOSIS — I259 Chronic ischemic heart disease, unspecified: Secondary | ICD-10-CM | POA: Diagnosis not present

## 2021-07-03 DIAGNOSIS — G4733 Obstructive sleep apnea (adult) (pediatric): Secondary | ICD-10-CM | POA: Diagnosis not present

## 2021-07-03 DIAGNOSIS — R601 Generalized edema: Secondary | ICD-10-CM | POA: Insufficient documentation

## 2021-07-03 NOTE — Progress Notes (Unsigned)
Subjective:    Patient ID: [name] [name], male    DOB: Mar 02, 1926, 88 y.o.   MRN: [id number]  HPI: [name] [name] is a 88 y.o. male who is here for HFU appointment follow up of his Left Middle Cerebral Artery Stroke, Atrial Fibrillation and Anasarca. He presented to [facility] ED on 05/25/2021 with right-sided weakness and aphasia, acute onset.  DR Lorin Mercy H&P: on 05/25/2021 Chief Complaint: Stroke-like symptoms   HPI: [name] [name] is a 88 y.o. male with medical history significant of AAA; CAD s/p CABG; chronic systolic CHF; afib s/p Maze, on Xarelto; HTN; HLD; PMR (currently on steroids); pre-diabetes; OSA on CPAP; PAD with lymphedema; and AS s/p TAVR presenting with stroke-like symptoms. He has been having a lot of back pain and has been disabled in an electric wheelchair.  They had a meeting with bad news.  Shortly thereafter, he was trembling on one side with slurred speech.  His episode lasted less than a minute but the speech difficulty persisted.  Still having some dysarthria.  He has been anemic/thrombocytopenic recently with transfusion x 2 units twice.  He saw oncology at [facility] and his iron was good but they having been giving Epogen.  He was due for his second injection today (of 3 total).  He saw the urologist this week too and was diagnosed with a UTI and is on antibiotics (has only taken two days and 3 doses of cefdinir), urine was cloudy with gross hematuria and back pain.  He has had a compression fracture and this is why he doesn't walk.  He also has wounds and had Mohs surgery Monday on his chin, sees wound care every other week.     ER Course:  Came in as code stroke - speech difficulty, RUE weakness.  Neuro consulted. CT with acute stroke, L frontal.  CTA without LVO.  Recommends admission.  CT Head: WO Contrast: IMPRESSION: Small left MCA territory cortical infarct within the mid-to-posterior left frontal lobe, new from the prior head CT of 08/17/2020 and likely acute. ASPECTS is  9.   Mild chronic small vessel ischemic changes within the cerebral white matter.   Redemonstrated small chronic infarct within the left cerebellar hemisphere.   Mild-to-moderate generalized cerebral atrophy.   Bilateral ethmoid sinusitis.  CTA:  IMPRESSION: CTA neck:   1. The common carotid and internal carotid arteries are patent within the neck. Atherosclerotic plaque bilaterally, without hemodynamically significant stenosis. 2. Vertebral arteries patent within the neck. Non-stenotic calcified plaque at the origin of the left vertebral artery. 3. Pulmonary edema within the imaged lung apices. 4. Incompletely imaged bilateral pleural effusions. 5. Cervical spondylosis. C2-C3 and C5-C6 vertebral ankylosis.   CTA head:   1. No intracranial large vessel occlusion is identified. 2. Intracranial atherosclerotic disease with multifocal stenoses, most notably as follows. 3. Multifocal severe stenoses within the right vertebral artery at the level of the skull base and intracranially. 4. Moderate stenosis within the proximal V4 left vertebral artery. 5. Moderate stenosis of the paraclinoid right ICA.   MR Brain: WO Contrast IMPRESSION: Small areas of acute infarction in the right occipital lobe and left frontal lobe over the convexity. Mild to moderate acute infarct left operculum. Possible cerebral emboli.   Atrophy and mild chronic microvascular ischemia  Neurology was consulted: He was maintained on Xarelto  [name] [name] was admitted to inpatient rehabilitation on 05/31/2021 and discharged home on 06/14/2021. He is receiving [facility] Health Therapy with [facility] . He states he has  pain in his lower back. He rates his pain 3.  Left Lower Extremity Wound from Mohs Procedure , site cleansed and New dressing applied. They will F/U with Surgical Center: Site is weeping drainage.   Left Ankle Wound: Site Cleansed and new dressing applied: Wound Care following.   [name] [name] awaken with  Urticaria on his chest, shoulders and upper back, his wife states no medication changes or no new foods were consumed. Spoke with pharmacist at [facility]: Benadryl 12.5 mg was ordered and wife verbalizes understanding.   She also called his PCP to schedule HFU appointment, she is awaiting a return call.   [name] [name] arrived in wheelchair        Pain Inventory Average Pain 2 Pain Right Now 3 My pain is constant, dull, and aching  LOCATION OF PAIN  back  BOWEL Number of stools per week: 7 Oral laxative use Yes  Type of laxative colace   BLADDER Pads Bladder incontinence Yes  Frequent urination No  Leakage with coughing No  Difficulty starting stream Yes  Incomplete bladder emptying Yes    Mobility ability to climb steps?  no do you drive?  no use a wheelchair needs help with transfers  Function retired I need assistance with the following:  dressing, bathing, toileting, meal prep, household duties, and shopping  Neuro/Psych bladder control problems weakness numbness trouble walking confusion depression loss of taste or smell  Prior Studies Any changes since last visit?  no  Physicians involved in your care Any changes since last visit?  no   Family History  Problem Relation Age of Onset   Dementia Mother    Stroke Brother    Social History   Socioeconomic History   Marital status: Married    Spouse name: Not on file   Number of children: 4   Years of education: Not on file   Highest education level: Not on file  Occupational History   Occupation: retired    Comment: [employer]  Tobacco Use   Smoking status: Former    Packs/day: 0.50    Years: 15.00    Total pack years: 7.50    Types: Cigarettes    Quit date: 11/18/1962    Years since quitting: 58.6    Passive exposure: Never   Smokeless tobacco: Never  Vaping Use   Vaping Use: Never used  Substance and Sexual Activity   Alcohol use: Yes    Alcohol/week: 2.0 standard drinks of  alcohol    Types: 2 Shots of liquor per week    Comment: daily, not in last few days   Drug use: Never   Sexual activity: Yes  Other Topics Concern   Not on file  Social History Narrative   Lives with wife in [place].     Very active physically without significant limitations.    Exercises regularly   Social Determinants of Health   Financial Resource Strain: Not on file  Food Insecurity: Not on file  Transportation Needs: Not on file  Physical Activity: Not on file  Stress: Not on file  Social Connections: Not on file   Past Surgical History:  Procedure Laterality Date   ABDOMINAL AORTOGRAM W/LOWER EXTREMITY N/A 10/26/2020   Procedure: ABDOMINAL AORTOGRAM W/LOWER EXTREMITY;  Surgeon: Broadus John, MD;  Location: [place] [facility];  Service: Cardiovascular;  Laterality: N/A;   CARDIAC CATHETERIZATION     CATARACT EXTRACTION, BILATERAL     with lens implants   CHOLECYSTECTOMY N/A 03/24/2012  Procedure: LAPAROSCOPIC CHOLECYSTECTOMY WITH INTRAOPERATIVE CHOLANGIOGRAM;  Surgeon: Zenovia Jarred, MD;  Location: [place];  Service: General;  Laterality: N/A;   CORONARY ARTERY BYPASS GRAFT N/A 03/06/2012   Procedure: CORONARY ARTERY BYPASS GRAFTING (CABG);  Surgeon: Rexene Alberts, MD;  Location: [place];  Service: Open Heart Surgery;  Laterality: N/A;   ENDOVEIN HARVEST OF GREATER SAPHENOUS VEIN Right 03/06/2012   Procedure: ENDOVEIN HARVEST OF GREATER SAPHENOUS VEIN;  Surgeon: Rexene Alberts, MD;  Location: [place];  Service: Open Heart Surgery;  Laterality: Right;   INTRAOPERATIVE TRANSESOPHAGEAL ECHOCARDIOGRAM N/A 03/06/2012   Procedure: INTRAOPERATIVE TRANSESOPHAGEAL ECHOCARDIOGRAM;  Surgeon: Rexene Alberts, MD;  Location: [place];  Service: Open Heart Surgery;  Laterality: N/A;   IR KYPHO LUMBAR INC FX REDUCE BONE BX UNI/BIL CANNULATION INC/IMAGING  02/23/2020   LEFT HEART CATH Right 03/02/2012   Procedure: LEFT HEART CATH;  Surgeon: Sherren Mocha, MD;  Location: [facility];  Service:  Cardiovascular;  Laterality: Right;   MAZE N/A 03/06/2012   Procedure: MAZE;  Surgeon: Rexene Alberts, MD;  Location: [place];  Service: Open Heart Surgery;  Laterality: N/A;   PERIPHERAL VASCULAR INTERVENTION Right 10/26/2020   Procedure: PERIPHERAL VASCULAR INTERVENTION;  Surgeon: Broadus John, MD;  Location: [place] [facility];  Service: Cardiovascular;  Laterality: Right;   PROSTATECTOMY     partial   RIGHT/LEFT HEART CATH AND CORONARY/GRAFT ANGIOGRAPHY N/A 09/29/2019   Procedure: RIGHT/LEFT HEART CATH AND CORONARY/GRAFT ANGIOGRAPHY;  Surgeon: Adrian Prows, MD;  Location: [place] [facility];  Service: Cardiovascular;  Laterality: N/A;   TEE WITHOUT CARDIOVERSION N/A 11/10/2019   Procedure: TRANSESOPHAGEAL ECHOCARDIOGRAM (TEE);  Surgeon: Burnell Blanks, MD;  Location: [place];  Service: Open Heart Surgery;  Laterality: N/A;   TRANSCATHETER AORTIC VALVE REPLACEMENT, TRANSFEMORAL  11/10/2019   Past Medical History:  Diagnosis Date   AAA (abdominal aortic aneurysm) (HCC)    Arthritis    Ascending aorta dilatation ([place]) 03/13/2012   Fusiform dilatation of the ascending thoracic aorta discovered during surgery, max diameter 4.2-4.3 cm   BPH (benign prostatic hypertrophy)    Chronic systolic heart failure ([place]) 03/03/2012   Chronic venous insufficiency    GERD (gastroesophageal reflux disease)    Glaucoma    Gout    History of kidney stones    Hyperlipidemia    Hypertension    Obesity (BMI 30-39.9)    Paroxysmal atrial fibrillation (HCC) 02/29/2012   Recurrent paroxysmal, new-onset    PMR (polymyalgia rheumatica) (HCC)    Polymyalgia rheumatica ([place]) 02/06/2019   Pre-diabetes    S/P CABG x 3 03/06/2012   LIMA to LAD, SVG to OM, SVG to RCA, EVH via right thigh   S/P Maze operation for atrial fibrillation 03/06/2012   Complete bilateral lesions set using bipolar radiofrequency and cryothermy ablation with clipping of LA appendage   S/P TAVR (transcatheter aortic valve  replacement) 11/10/2019   s/p TAVR with a 26 mm Edwards via the left subclavian by Drs Buena Irish and Bartle   Severe aortic stenosis 09/29/2019   Sleep apnea    USES CPAP NIGHTLY   BP 136/64   Pulse 62   Ht 6' (1.829 m)   SpO2 95%   BMI 29.42 kg/m   Opioid Risk Score:   Fall Risk Score:  `1  Depression screen [facility] 2/9     07/03/2021   10:23 AM 05/05/2012    2:53 PM 04/16/2012    2:06 PM 07/13/2011    3:34 PM  04/18/2011    8:56 AM  Depression screen PHQ 2/9  Decreased Interest 0 0 0 0 0  Down, Depressed, Hopeless 0 0 0 0 1  PHQ - 2 Score 0 0 0 0 1  Altered sleeping 0      Tired, decreased energy 1      Change in appetite 0      Feeling bad or failure about yourself  1      Trouble concentrating 2      Moving slowly or fidgety/restless 3      Suicidal thoughts 0      PHQ-9 Score 7      Difficult doing work/chores Very difficult         Review of Systems  Constitutional:  Positive for unexpected weight change.  HENT: Negative.    Eyes: Negative.   Respiratory:  Positive for apnea.   Cardiovascular:  Positive for leg swelling.  Gastrointestinal:  Positive for abdominal pain and constipation.  Endocrine: Negative.   Genitourinary:  Positive for difficulty urinating.  Musculoskeletal:  Positive for back pain and gait problem.  Skin:  Positive for rash and wound.  Allergic/Immunologic: Negative.   Neurological:  Positive for weakness and numbness.  Hematological:  Bruises/bleeds easily.  Psychiatric/Behavioral:  Positive for confusion and dysphoric mood.       Objective:   Physical Exam Constitutional:      Appearance: Normal appearance.  Cardiovascular:     Rate and Rhythm: Normal rate and regular rhythm.     Pulses: Normal pulses.     Heart sounds: Normal heart sounds.  Pulmonary:     Effort: Pulmonary effort is normal.     Breath sounds: Normal breath sounds.  Musculoskeletal:     Cervical back: Normal range of motion and neck supple.     Comments: Normal  Muscle Bulk and Muscle Testing Reveals:  Upper Extremities: Full ROM and Muscle Strength on Right 4/5 and Left 5/5  Lumbar Paraspinal Tenderness: L-4-L-5 Lower Extremities: Full ROM and Muscle Strength 5/5 Arrived in wheelchair     Skin:    General: Skin is warm and dry.     Findings: Rash present.     Comments: Urticaria : Noted to Bilateral shoulder, Chest and Upper Back   Neurological:     Mental Status: He is alert and oriented to person, place, and time.  Psychiatric:        Mood and Affect: Mood normal.        Behavior: Behavior normal.         Assessment & Plan:  1.Left Middle Cerebral Artery Stroke: Continue Home Health Therapy with [facility]. Continue current medication regimen, he has a scheduled appointment with Neurology.  2. Atrial Fibrillation: Continue current medication regimen with Xarelto: Cardiology Following. Continue to Monitor.  3. Anasarca.Continue current medication regimen: He has a scheduled appointment with PCP. Wife states she was told a few days ago to increase his Torsemide for a few days. She is in contact with his PCP she reports.   F/U with Dr Letta Pate in 4-6 weeks .

## 2021-07-05 ENCOUNTER — Encounter (HOSPITAL_BASED_OUTPATIENT_CLINIC_OR_DEPARTMENT_OTHER): Payer: Medicare Other | Attending: Physician Assistant | Admitting: Physician Assistant

## 2021-07-05 DIAGNOSIS — N401 Enlarged prostate with lower urinary tract symptoms: Secondary | ICD-10-CM | POA: Insufficient documentation

## 2021-07-05 DIAGNOSIS — S71111D Laceration without foreign body, right thigh, subsequent encounter: Secondary | ICD-10-CM | POA: Diagnosis not present

## 2021-07-05 DIAGNOSIS — L97312 Non-pressure chronic ulcer of right ankle with fat layer exposed: Secondary | ICD-10-CM | POA: Diagnosis not present

## 2021-07-05 DIAGNOSIS — I130 Hypertensive heart and chronic kidney disease with heart failure and stage 1 through stage 4 chronic kidney disease, or unspecified chronic kidney disease: Secondary | ICD-10-CM | POA: Diagnosis not present

## 2021-07-05 DIAGNOSIS — X58XXXD Exposure to other specified factors, subsequent encounter: Secondary | ICD-10-CM | POA: Insufficient documentation

## 2021-07-05 DIAGNOSIS — D649 Anemia, unspecified: Secondary | ICD-10-CM | POA: Diagnosis not present

## 2021-07-05 DIAGNOSIS — I872 Venous insufficiency (chronic) (peripheral): Secondary | ICD-10-CM | POA: Diagnosis not present

## 2021-07-05 DIAGNOSIS — N183 Chronic kidney disease, stage 3 unspecified: Secondary | ICD-10-CM | POA: Diagnosis not present

## 2021-07-05 DIAGNOSIS — L89153 Pressure ulcer of sacral region, stage 3: Secondary | ICD-10-CM | POA: Insufficient documentation

## 2021-07-05 DIAGNOSIS — I63512 Cerebral infarction due to unspecified occlusion or stenosis of left middle cerebral artery: Secondary | ICD-10-CM | POA: Diagnosis not present

## 2021-07-05 DIAGNOSIS — I7389 Other specified peripheral vascular diseases: Secondary | ICD-10-CM | POA: Diagnosis not present

## 2021-07-05 DIAGNOSIS — L89613 Pressure ulcer of right heel, stage 3: Secondary | ICD-10-CM | POA: Insufficient documentation

## 2021-07-05 DIAGNOSIS — T8131XA Disruption of external operation (surgical) wound, not elsewhere classified, initial encounter: Secondary | ICD-10-CM | POA: Diagnosis not present

## 2021-07-05 DIAGNOSIS — L97822 Non-pressure chronic ulcer of other part of left lower leg with fat layer exposed: Secondary | ICD-10-CM | POA: Insufficient documentation

## 2021-07-05 DIAGNOSIS — R21 Rash and other nonspecific skin eruption: Secondary | ICD-10-CM | POA: Diagnosis not present
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
## 2021-07-05 NOTE — Progress Notes (Signed)
DOHN, STCLAIR I. ([id number]) Visit Report for 07/05/2021 Arrival Information Details Patient Name: Date of Service: [name] [name] ID I. 07/05/2021 12:45 PM Medical Record Number: [id number] Patient Account Number: [id number] Date of Birth/Sex: Treating RN: [birth year] (88 y.o. [name] Primary Care Kriya Westra: [name] Other Clinician: Referring Laia Wiley: Treating Inman Fettig/Extender: Eli Hose in Treatment: 64 Visit Information History Since Last Visit Added or deleted any medications: No Patient Arrived: Wheel Chair Any new allergies or adverse reactions: No Arrival Time: 12:49 Had a fall or experienced change in No Accompanied By: family activities of daily living that may affect Transfer Assistance: None risk of falls: Patient Identification Verified: Yes Signs or symptoms of abuse/neglect since last visito No Secondary Verification Process Completed: Yes Hospitalized since last visit: No Patient Requires Transmission-Based Precautions: No Implantable device outside of the clinic excluding No Patient Has Alerts: Yes cellular tissue based products placed in the center Patient Alerts: Patient on Blood Thinner since last visit: Has Dressing in Place as Prescribed: Yes Pain Present Now: Yes Electronic Signature(s) Signed: 07/05/2021 4:36:02 PM By: Sharyn Creamer RN, BSN Entered By: Sharyn Creamer on 07/05/2021 14:13:59 -------------------------------------------------------------------------------- Encounter Discharge Information Details Patient Name: Date of Service: [name] [name], [name] 07/05/2021 12:45 PM Medical Record Number: [id number] Patient Account Number: [id number] Date of Birth/Sex: Treating RN: 1926/01/18 (88 y.o. [name] Primary Care Leander Tout: [name] Other Clinician: Referring Alijah Akram: Treating Shamila Lerch/Extender: Eli Hose in Treatment: 65 Encounter Discharge Information Items Post  Procedure Vitals Discharge Condition: Stable Temperature (F): 98.3 Ambulatory Status: Wheelchair Pulse (bpm): 72 Discharge Destination: Home Respiratory Rate (breaths/min): 18 Transportation: Private Auto Blood Pressure (mmHg): 117/63 Accompanied By: family Schedule Follow-up Appointment: Yes Clinical Summary of Care: Patient Declined Electronic Signature(s) Signed: 07/05/2021 4:36:02 PM By: Sharyn Creamer RN, BSN Entered By: Sharyn Creamer on 07/05/2021 14:13:36 -------------------------------------------------------------------------------- Lower Extremity Assessment Details Patient Name: Date of Service: [name] [name], [name] 07/05/2021 12:45 PM Medical Record Number: [id number] Patient Account Number: [id number] Date of Birth/Sex: Treating RN: 04-01-1926 (88 y.o. [name] Primary Care Duchess Armendarez: [name] Other Clinician: Referring Johnthomas Lader: Treating Jerid Catherman/Extender: [name] in Treatment: 41 Edema Assessment Assessed: [Left: No] [Right: No] Edema: [Left: Yes] [Right: Yes] Calf Left: Right: Point of Measurement: 34 cm From Medial Instep 41 cm 42.5 cm Ankle Left: Right: Point of Measurement: 11 cm From Medial Instep 26.5 cm 29.5 cm Vascular Assessment Pulses: Dorsalis Pedis Palpable: [Left:Yes] [Right:Yes] Electronic Signature(s) Signed: 07/05/2021 4:36:02 PM By: Sharyn Creamer RN, BSN Entered By: Sharyn Creamer on 07/05/2021 13:08:15 -------------------------------------------------------------------------------- Multi-Disciplinary Care Plan Details Patient Name: Date of Service: [name] [name], [name] 07/05/2021 12:45 PM Medical Record Number: [id number] Patient Account Number: [id number] Date of Birth/Sex: Treating RN: 07-16-1926 (88 y.o. [name] Primary Care Emika Tiano: [name] Other Clinician: Referring Kailia Starry: Treating Asahel Risden/Extender: Eli Hose in Treatment: 47 Active  Inactive Abuse / Safety / Falls / Self Care Management Nursing Diagnoses: Potential for falls Goals: Patient will remain injury free related to falls Date Initiated: 03/22/2021 Target Resolution Date: 07/28/2021 Goal Status: Active Interventions: Assess: immobility, friction, shearing, incontinence upon admission and as needed Assess impairment of mobility on admission and as needed per policy Assess self care needs on admission and as needed Provide education on fall prevention Notes: Nutrition Nursing Diagnoses: Potential for alteratiion in Nutrition/Potential for imbalanced nutrition Goals:  Patient/caregiver agrees to and verbalizes understanding of need to obtain nutritional consultation Date Initiated: 03/22/2021 T arget Resolution Date: 07/28/2021 Goal Status: Active Interventions: Provide education on nutrition Treatment Activities: Education provided on Nutrition : 05/10/2021 Giving encouragement to exercise : 03/22/2021 Notes: Electronic Signature(s) Signed: 07/05/2021 4:36:02 PM By: Sharyn Creamer RN, BSN Entered By: Sharyn Creamer on 07/05/2021 13:49:23 -------------------------------------------------------------------------------- Pain Assessment Details Patient Name: Date of Service: [name] [name], [name] 07/05/2021 12:45 PM Medical Record Number: [id number] Patient Account Number: [id number] Date of Birth/Sex: Treating RN: Feb 14, 1926 (88 y.o. [name] Primary Care Kyren Vaux: [name] Other Clinician: Referring Aaban Griep: Treating Geet Hosking/Extender: Eli Hose in Treatment: 53 Active Problems Location of Pain Severity and Description of Pain Patient Has Paino Yes Site Locations Rate the pain. Current Pain Level: 3 Character of Pain Describe the Pain: Burning Pain Management and Medication Current Pain Management: Medication: Yes Rest: Yes Electronic Signature(s) Signed: 07/05/2021 4:36:02 PM By: Sharyn Creamer RN,  BSN Entered By: Sharyn Creamer on 07/05/2021 12:51:05 -------------------------------------------------------------------------------- Patient/Caregiver Education Details Patient Name: Date of Service: [name] [name] ID I. 7/5/2023andnbsp12:45 PM Medical Record Number: [id number] Patient Account Number: [id number] Date of Birth/Gender: Treating RN: September 07, 1926 (87 y.o. [name] Primary Care Physician: [name] Other Clinician: Referring Physician: Treating Physician/Extender: Eli Hose in Treatment: 82 Education Assessment Education Provided To: Patient and Caregiver Education Topics Provided Wound/Skin Impairment: Methods: Explain/Verbal Responses: State content correctly [employer]) Signed: 07/05/2021 4:36:02 PM By: Sharyn Creamer RN, BSN Entered By: Sharyn Creamer on 07/05/2021 13:49:50 -------------------------------------------------------------------------------- Wound Assessment Details Patient Name: Date of Service: [name] [name], Shaune Pascal ID I. 07/05/2021 12:45 PM Medical Record Number: [id number] Patient Account Number: [id number] Date of Birth/Sex: Treating RN: 13-Jul-1926 (88 y.o. [name] Primary Care Elliemae Braman: [name] Other Clinician: Referring Brennin Durfee: Treating Yasir Kitner/Extender: Eli Hose in Treatment: 41 Wound Status Wound Number: 13 Primary Abrasion Etiology: Wound Location: Right T Great oe Wound Healed - Epithelialized Wounding Event: Bump Status: Date Acquired: 05/30/2021 Comorbid Cataracts, Sleep Apnea, Arrhythmia, Congestive Heart Failure, Weeks Of Treatment: 2 History: Coronary Artery Disease, Hypertension, Myocardial Infarction, Clustered Wound: No End Stage Renal Disease, Gout, Dementia, Neuropathy Photos Wound Measurements Length: (cm) Width: (cm) Depth: (cm) Area: (cm) Volume: (cm) 0 % Reduction in Area: 100% 0 % Reduction in Volume: 100% 0  Epithelialization: Large (67-100%) 0 Tunneling: No 0 Undermining: No Wound Description Classification: Full Thickness Without Exposed Support Structures Wound Margin: Distinct, outline attached Exudate Amount: None Present Foul Odor After Cleansing: No Slough/Fibrino No Wound Bed Granulation Amount: None Present (0%) Exposed Structure Necrotic Amount: None Present (0%) Fascia Exposed: No Fat Layer (Subcutaneous Tissue) Exposed: No Tendon Exposed: No Muscle Exposed: No Joint Exposed: No Bone Exposed: No Electronic Signature(s) Signed: 07/05/2021 4:36:02 PM By: Sharyn Creamer RN, BSN Entered By: Sharyn Creamer on 07/05/2021 13:21:50 -------------------------------------------------------------------------------- Wound Assessment Details Patient Name: Date of Service: [name] [name], Shaune Pascal ID I. 07/05/2021 12:45 PM Medical Record Number: [id number] Patient Account Number: [id number] Date of Birth/Sex: Treating RN: 03-04-1926 (88 y.o. [name] Primary Care Tondra Reierson: [name] Other Clinician: Referring Matson Welch: Treating Luvinia Lucy/Extender: [name] in Treatment: 41 Wound Status Wound Number: 14 Primary Open Surgical Wound Etiology: Wound Location: Left, Anterior Lower Leg Wound Open Wounding Event: Surgical Injury Status: Date Acquired: 05/26/2021 Comorbid Cataracts, Sleep Apnea, Arrhythmia, Congestive Heart Failure, Weeks Of Treatment: 0 History:  Coronary Artery Disease, Hypertension, Myocardial Infarction, Clustered Wound: No End Stage Renal Disease, Gout, Dementia, Neuropathy Photos Wound Measurements Length: (cm) 2.5 Width: (cm) 2 Depth: (cm) 0.2 Area: (cm) 3.927 Volume: (cm) 0.785 % Reduction in Area: % Reduction in Volume: Epithelialization: None Tunneling: No Undermining: No Wound Description Classification: Full Thickness Without Exposed Support Structu Exudate Amount: Large Exudate Type: Serosanguineous Exudate Color:  red, brown res Foul Odor After Cleansing: No Slough/Fibrino Yes Wound Bed Granulation Amount: Small (1-33%) Exposed Structure Granulation Quality: Red Fascia Exposed: No Necrotic Amount: Large (67-100%) Fat Layer (Subcutaneous Tissue) Exposed: Yes Necrotic Quality: Adherent Slough Tendon Exposed: No Muscle Exposed: No Joint Exposed: No Bone Exposed: No Treatment Notes Wound #14 (Lower Leg) Wound Laterality: Left, Anterior Cleanser Soap and Water Discharge Instruction: May shower and wash wound with dial antibacterial soap and water prior to dressing change. Peri-Wound Care Topical Primary Dressing Promogran Prisma Matrix, 4.34 (sq in) (silver collagen) Discharge Instruction: Moisten collagen with hydrogel or KY jelly. Secondary Dressing Woven Gauze Sponge, Non-Sterile 4x4 in Discharge Instruction: Apply over primary dressing as directed. Zetuvit Plus Silicone Border Dressing 4x4 (in/in) Discharge Instruction: Apply silicone border over primary dressing as directed. Secured With Elastic Bandage 4 inch (ACE bandage) Discharge Instruction: Secure with ACE bandage as directed. Kerlix Roll Sterile, 4.5x3.1 (in/yd) Discharge Instruction: Secure with Kerlix as directed. Compression Wrap Compression Stockings Add-Ons Electronic Signature(s) Signed: 07/05/2021 4:36:02 PM By: Sharyn Creamer RN, BSN Entered By: Sharyn Creamer on 07/05/2021 13:15:43 -------------------------------------------------------------------------------- Wound Assessment Details Patient Name: Date of Service: [name] [name], [name] 07/05/2021 12:45 PM Medical Record Number: [id number] Patient Account Number: [id number] Date of Birth/Sex: Treating RN: May 10, 1926 (88 y.o. [name] Primary Care Danyeal Akens: [name] Other Clinician: Referring Natsumi Whitsitt: Treating Jaymason Ledesma/Extender: Eli Hose in Treatment: 41 Wound Status Wound Number: 3 Primary Venous Leg  Ulcer Etiology: Wound Location: Left, Lateral Ankle Wound Open Wounding Event: Gradually Appeared Status: Date Acquired: 10/12/2020 Comorbid Cataracts, Sleep Apnea, Arrhythmia, Congestive Heart Failure, Weeks Of Treatment: 33 History: Coronary Artery Disease, Hypertension, Myocardial Infarction, Clustered Wound: No End Stage Renal Disease, Gout, Dementia, Neuropathy Photos Wound Measurements Length: (cm) 1.3 Width: (cm) 1.5 Depth: (cm) 0.2 Area: (cm) 1.532 Volume: (cm) 0.306 % Reduction in Area: -204.6% % Reduction in Volume: -102.6% Epithelialization: Medium (34-66%) Tunneling: No Undermining: No Wound Description Classification: Full Thickness Without Exposed Support Structures Wound Margin: Epibole Exudate Amount: Medium Exudate Type: Serosanguineous Exudate Color: red, brown Foul Odor After Cleansing: No Slough/Fibrino Yes Wound Bed Granulation Amount: Large (67-100%) Exposed Structure Granulation Quality: Red Fascia Exposed: No Necrotic Amount: Small (1-33%) Fat Layer (Subcutaneous Tissue) Exposed: Yes Necrotic Quality: Adherent Slough Tendon Exposed: No Muscle Exposed: No Joint Exposed: No Bone Exposed: No Treatment Notes Wound #3 (Ankle) Wound Laterality: Left, Lateral Cleanser Soap and Water Discharge Instruction: May shower and wash wound with dial antibacterial soap and water prior to dressing change. Peri-Wound Care Topical Primary Dressing Promogran Prisma Matrix, 4.34 (sq in) (silver collagen) Discharge Instruction: Moisten collagen with hydrogel or KY jelly. Secondary Dressing Woven Gauze Sponge, Non-Sterile 4x4 in Discharge Instruction: Apply over primary dressing as directed. Zetuvit Plus Silicone Border Dressing 4x4 (in/in) Discharge Instruction: Apply silicone border over primary dressing as directed. Secured With Compression Wrap Compression Stockings Environmental education officer) Signed: 07/05/2021 4:36:02 PM By: Sharyn Creamer RN,  BSN Entered By: Sharyn Creamer on 07/05/2021 13:13:20 -------------------------------------------------------------------------------- Wound Assessment Details Patient Name: Date of Service: [name] [name], [name] 07/05/2021 12:45 PM  Medical Record Number: [id number] Patient Account Number: [id number] Date of Birth/Sex: Treating RN: 08-12-1926 (88 y.o. [name] Primary Care Domenique Southers: [name] Other Clinician: Referring Taison Celani: Treating Treavon Castilleja/Extender: Eli Hose in Treatment: 41 Wound Status Wound Number: 6 Primary Arterial Insufficiency Ulcer Etiology: Wound Location: Right, Lateral Ankle Wound Open Wounding Event: Gradually Appeared Status: Date Acquired: 11/22/2020 Comorbid Cataracts, Sleep Apnea, Arrhythmia, Congestive Heart Failure, Weeks Of Treatment: 28 History: Coronary Artery Disease, Hypertension, Myocardial Infarction, Clustered Wound: No End Stage Renal Disease, Gout, Dementia, Neuropathy Photos Wound Measurements Length: (cm) 1 Width: (cm) 0.5 Depth: (cm) 0.2 Area: (cm) 0.393 Volume: (cm) 0.079 % Reduction in Area: -42.9% % Reduction in Volume: 42.3% Epithelialization: Small (1-33%) Tunneling: No Undermining: No Wound Description Classification: Full Thickness Without Exposed Support Structures Wound Margin: Distinct, outline attached Exudate Amount: Medium Exudate Type: Serosanguineous Exudate Color: red, brown Wound Bed Granulation Amount: Medium (34-66%) Granulation Quality: Pink Necrotic Amount: Medium (34-66%) Necrotic Quality: Adherent Slough Foul Odor After Cleansing: No Slough/Fibrino Yes Exposed Structure Fascia Exposed: No Fat Layer (Subcutaneous Tissue) Exposed: Yes Tendon Exposed: No Muscle Exposed: No Joint Exposed: No Bone Exposed: No Treatment Notes Wound #6 (Ankle) Wound Laterality: Right, Lateral Cleanser Soap and Water Discharge Instruction: May shower and wash wound with dial  antibacterial soap and water prior to dressing change. Peri-Wound Care Zinc Oxide Ointment 30g tube Discharge Instruction: Apply Zinc Oxide to periwound with each dressing change Topical Primary Dressing Promogran Prisma Matrix, 4.34 (sq in) (silver collagen) Discharge Instruction: Moisten collagen with hydrogel or KY jelly. Secondary Dressing Woven Gauze Sponge, Non-Sterile 4x4 in Discharge Instruction: Apply over primary dressing as directed. Zetuvit Plus Silicone Border Dressing 4x4 (in/in) Discharge Instruction: Apply silicone border over primary dressing as directed. Secured With Elastic Bandage 4 inch (ACE bandage) Discharge Instruction: Secure with ACE bandage as directed. Kerlix Roll Sterile, 4.5x3.1 (in/yd) Discharge Instruction: Secure with Kerlix as directed. Compression Wrap Compression Stockings Add-Ons Electronic Signature(s) Signed: 07/05/2021 4:36:02 PM By: Sharyn Creamer RN, BSN Entered By: Sharyn Creamer on 07/05/2021 13:13:50 -------------------------------------------------------------------------------- Vitals Details Patient Name: Date of Service: [name] [name], [name] 07/05/2021 12:45 PM Medical Record Number: [id number] Patient Account Number: [id number] Date of Birth/Sex: Treating RN: 01-21-1926 (88 y.o. [name] Primary Care Shaelynn Dragos: [name] Other Clinician: Referring Ailish Prospero: Treating Brinden Kincheloe/Extender: Eli Hose in Treatment: 36 Vital Signs Time Taken: 12:47 Temperature (F): 98.3 Height (in): 72 Pulse (bpm): 72 Weight (lbs): 223 Respiratory Rate (breaths/min): 20 Body Mass Index (BMI): 30.2 Blood Pressure (mmHg): 117/63 Reference Range: 80 - 120 mg / dl Electronic Signature(s) Signed: 07/05/2021 4:36:02 PM By: Sharyn Creamer RN, BSN Entered By: Sharyn Creamer on 07/05/2021 12:50:42

## 2021-07-06 DIAGNOSIS — H40013 Open angle with borderline findings, low risk, bilateral: Secondary | ICD-10-CM | POA: Diagnosis not present

## 2021-07-06 DIAGNOSIS — H02831 Dermatochalasis of right upper eyelid: Secondary | ICD-10-CM | POA: Diagnosis not present

## 2021-07-06 DIAGNOSIS — H02834 Dermatochalasis of left upper eyelid: Secondary | ICD-10-CM | POA: Diagnosis not present

## 2021-07-06 DIAGNOSIS — M353 Polymyalgia rheumatica: Secondary | ICD-10-CM | POA: Diagnosis not present

## 2021-07-12 DIAGNOSIS — I890 Lymphedema, not elsewhere classified: Secondary | ICD-10-CM | POA: Diagnosis not present

## 2021-07-13 DIAGNOSIS — I10 Essential (primary) hypertension: Secondary | ICD-10-CM | POA: Diagnosis not present

## 2021-07-13 DIAGNOSIS — R82998 Other abnormal findings in urine: Secondary | ICD-10-CM | POA: Diagnosis not present

## 2021-07-17 ENCOUNTER — Ambulatory Visit (INDEPENDENT_AMBULATORY_CARE_PROVIDER_SITE_OTHER): Payer: Medicare Other | Admitting: Physical Medicine and Rehabilitation

## 2021-07-17 ENCOUNTER — Encounter: Payer: Self-pay | Admitting: Physical Medicine and Rehabilitation

## 2021-07-17 ENCOUNTER — Ambulatory Visit: Payer: Self-pay

## 2021-07-17 VITALS — BP 121/54 | HR 68

## 2021-07-17 DIAGNOSIS — M5416 Radiculopathy, lumbar region: Secondary | ICD-10-CM

## 2021-07-17 MED ORDER — METHYLPREDNISOLONE ACETATE 80 MG/ML IJ SUSP
80.0000 mg | Freq: Once | INTRAMUSCULAR | Status: AC
  Administered 2021-07-17: 80 mg

## 2021-07-17 NOTE — Patient Instructions (Signed)

## 2021-07-17 NOTE — Progress Notes (Signed)
Pt state lower back pain that travels down both legs. Pt state standing makes the pain worse. Pt state he take over the over the counter pain meds to help ease his pain.  Numeric Pain Rating Scale and Functional Assessment Average Pain 4   In the last MONTH (on 0-10 scale) has pain interfered with the following?  1. General activity like being  able to carry out your everyday physical activities such as walking, climbing stairs, carrying groceries, or moving a chair?  Rating(8)   +Driver, -BT, -Dye Allergies.

## 2021-07-18 ENCOUNTER — Telehealth: Payer: Self-pay | Admitting: Oncology

## 2021-07-18 DIAGNOSIS — R21 Rash and other nonspecific skin eruption: Secondary | ICD-10-CM | POA: Diagnosis not present

## 2021-07-18 DIAGNOSIS — I5020 Unspecified systolic (congestive) heart failure: Secondary | ICD-10-CM | POA: Diagnosis not present

## 2021-07-18 DIAGNOSIS — I480 Paroxysmal atrial fibrillation: Secondary | ICD-10-CM | POA: Diagnosis not present

## 2021-07-18 DIAGNOSIS — I63512 Cerebral infarction due to unspecified occlusion or stenosis of left middle cerebral artery: Secondary | ICD-10-CM | POA: Diagnosis not present

## 2021-07-18 NOTE — Telephone Encounter (Signed)
Rescheduled August appointments per provider pal, called to inform patient about new upcoming rescheduled appointment. Left a voicemail. 

## 2021-07-19 ENCOUNTER — Encounter (HOSPITAL_BASED_OUTPATIENT_CLINIC_OR_DEPARTMENT_OTHER): Payer: Medicare Other | Admitting: Physician Assistant

## 2021-07-19 DIAGNOSIS — I890 Lymphedema, not elsewhere classified: Secondary | ICD-10-CM | POA: Diagnosis not present

## 2021-07-19 DIAGNOSIS — I2510 Atherosclerotic heart disease of native coronary artery without angina pectoris: Secondary | ICD-10-CM | POA: Diagnosis not present

## 2021-07-19 DIAGNOSIS — D631 Anemia in chronic kidney disease: Secondary | ICD-10-CM | POA: Diagnosis not present

## 2021-07-19 DIAGNOSIS — I69351 Hemiplegia and hemiparesis following cerebral infarction affecting right dominant side: Secondary | ICD-10-CM | POA: Diagnosis not present

## 2021-07-19 DIAGNOSIS — L97822 Non-pressure chronic ulcer of other part of left lower leg with fat layer exposed: Secondary | ICD-10-CM | POA: Diagnosis not present

## 2021-07-19 DIAGNOSIS — N401 Enlarged prostate with lower urinary tract symptoms: Secondary | ICD-10-CM | POA: Diagnosis not present

## 2021-07-19 DIAGNOSIS — L89612 Pressure ulcer of right heel, stage 2: Secondary | ICD-10-CM | POA: Diagnosis not present

## 2021-07-19 DIAGNOSIS — L97322 Non-pressure chronic ulcer of left ankle with fat layer exposed: Secondary | ICD-10-CM | POA: Diagnosis not present

## 2021-07-19 DIAGNOSIS — N1832 Chronic kidney disease, stage 3b: Secondary | ICD-10-CM | POA: Diagnosis not present

## 2021-07-19 DIAGNOSIS — I5022 Chronic systolic (congestive) heart failure: Secondary | ICD-10-CM | POA: Diagnosis not present

## 2021-07-19 DIAGNOSIS — T8131XA Disruption of external operation (surgical) wound, not elsewhere classified, initial encounter: Secondary | ICD-10-CM | POA: Diagnosis not present

## 2021-07-19 DIAGNOSIS — L89153 Pressure ulcer of sacral region, stage 3: Secondary | ICD-10-CM | POA: Diagnosis not present

## 2021-07-19 DIAGNOSIS — L97312 Non-pressure chronic ulcer of right ankle with fat layer exposed: Secondary | ICD-10-CM | POA: Diagnosis not present

## 2021-07-19 DIAGNOSIS — S71111D Laceration without foreign body, right thigh, subsequent encounter: Secondary | ICD-10-CM | POA: Diagnosis not present

## 2021-07-19 DIAGNOSIS — I872 Venous insufficiency (chronic) (peripheral): Secondary | ICD-10-CM | POA: Diagnosis not present

## 2021-07-19 DIAGNOSIS — I7389 Other specified peripheral vascular diseases: Secondary | ICD-10-CM | POA: Diagnosis not present

## 2021-07-19 DIAGNOSIS — N183 Chronic kidney disease, stage 3 unspecified: Secondary | ICD-10-CM | POA: Diagnosis not present

## 2021-07-19 DIAGNOSIS — Z483 Aftercare following surgery for neoplasm: Secondary | ICD-10-CM | POA: Diagnosis not present

## 2021-07-19 DIAGNOSIS — I130 Hypertensive heart and chronic kidney disease with heart failure and stage 1 through stage 4 chronic kidney disease, or unspecified chronic kidney disease: Secondary | ICD-10-CM | POA: Diagnosis not present

## 2021-07-19 DIAGNOSIS — L97329 Non-pressure chronic ulcer of left ankle with unspecified severity: Secondary | ICD-10-CM | POA: Diagnosis not present

## 2021-07-19 DIAGNOSIS — F0390 Unspecified dementia without behavioral disturbance: Secondary | ICD-10-CM | POA: Diagnosis not present

## 2021-07-19 DIAGNOSIS — I480 Paroxysmal atrial fibrillation: Secondary | ICD-10-CM | POA: Diagnosis not present

## 2021-07-19 DIAGNOSIS — L89623 Pressure ulcer of left heel, stage 3: Secondary | ICD-10-CM | POA: Diagnosis not present

## 2021-07-19 DIAGNOSIS — I714 Abdominal aortic aneurysm, without rupture, unspecified: Secondary | ICD-10-CM | POA: Diagnosis not present

## 2021-07-19 DIAGNOSIS — L89613 Pressure ulcer of right heel, stage 3: Secondary | ICD-10-CM | POA: Diagnosis not present

## 2021-07-19 DIAGNOSIS — I69321 Dysphasia following cerebral infarction: Secondary | ICD-10-CM | POA: Diagnosis not present

## 2021-07-19 NOTE — Progress Notes (Addendum)
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 4x4 in (Generic) Every Other Day/30 Days Discharge Instructions: Apply over primary dressing as directed. Secondary Dressing: Zetuvit Plus Silicone Border Dressing 4x4 (in/in) (Generic) Every Other Day/30 Days Discharge Instructions: Apply silicone border over primary dressing as directed. Secured With: Elastic Bandage 4 inch (ACE bandage) Every Other Day/30 Days Discharge Instructions: Secure with ACE bandage as directed. Secured With: [employer], 4.5x3.1 (in/yd) Every Other Day/30 Days Discharge Instructions: Secure with Kerlix as directed. WOUND #15: - Calcaneus Wound Laterality: Right Cleanser: Soap and Water Every Other Day/30 Days Discharge Instructions: May shower and wash wound with dial antibacterial soap and water prior to dressing change. Cleanser: Wound Cleanser Every Other Day/30 Days Discharge Instructions: Cleanse the wound with wound cleanser prior to applying a clean dressing using gauze sponges, not tissue or cotton balls. Prim Dressing: Iodosorb Gel 10 (gm) Tube Every Other Day/30 Days ary Discharge Instructions: Apply to wound bed as instructed Secondary Dressing: ALLEVYN Heel 4 1/2in x 5 1/2in / 10.5cm x 13.5cm Every Other Day/30 Days Discharge Instructions: Apply over primary dressing as directed. Secondary Dressing: Woven Gauze Sponge, Non-Sterile 4x4 in Every Other Day/30 Days Discharge Instructions: Apply over primary dressing as directed. WOUND #16: - Lower Leg Wound Laterality: Right, Lateral Cleanser: Soap and Water 1 x Per Day/30 Days Discharge Instructions: May shower and wash wound with dial antibacterial soap and water prior to dressing change. Cleanser: Wound Cleanser 1 x Per Day/30 Days Discharge Instructions: Cleanse the wound with wound cleanser prior to applying a  clean dressing using gauze sponges, not tissue or cotton balls. Prim Dressing: Xeroform Occlusive Gauze Dressing, 4x4 in 1 x Per Day/30 Days ary Discharge Instructions: Apply to wound bed as instructed Secondary Dressing: ABD Pad, 8x10 1 x Per Day/30 Days Discharge Instructions: Apply over primary dressing as directed. Secured With: Elastic Bandage 4 inch (ACE bandage) 1 x Per Day/30 Days Discharge Instructions: Secure with ACE bandage as directed. Secured With: [employer], 4.5x3.1 (in/yd) 1 x Per Day/30 Days Discharge Instructions: Secure with Kerlix as directed. WOUND #3: - Ankle Wound Laterality: Left, Lateral Cleanser: Soap and Water Every Other Day/30 Days Discharge Instructions: May shower and wash wound with dial antibacterial soap and water prior to dressing change. Prim Dressing: Promogran Prisma Matrix, 4.34 (sq in) (silver collagen) (Generic) Every Other Day/30 Days ary Discharge Instructions: Moisten collagen with hydrogel or KY jelly. Secondary Dressing: Woven Gauze Sponge, Non-Sterile 4x4 in (Generic) Every Other Day/30 Days Discharge Instructions: Apply over primary dressing as directed. Secondary Dressing: Zetuvit Plus Silicone Border Dressing 4x4 (in/in) (Generic) Every Other Day/30 Days Discharge Instructions: Apply silicone border over primary dressing as directed. WOUND #6: - Ankle Wound Laterality: Right, Lateral Cleanser: Soap and Water Every Other Day/30 Days Discharge Instructions: May shower and wash wound with dial antibacterial soap and water prior to dressing change. Prim Dressing: Promogran Prisma Matrix, 4.34 (sq in) (silver collagen) (Generic) Every Other Day/30 Days ary Discharge Instructions: Moisten collagen with hydrogel or KY jelly. Secondary Dressing: Woven Gauze Sponge, Non-Sterile 4x4 in (Generic) Every Other Day/30 Days Discharge Instructions: Apply over primary dressing as directed. Secondary Dressing: Zetuvit Plus Silicone Border Dressing  4x4 (in/in) (Generic) Every Other Day/30 Days Discharge Instructions: Apply silicone border over primary dressing as directed. Secured With: Elastic Bandage 4 inch (ACE bandage) Every Other Day/30 Days Discharge Instructions: Secure with ACE bandage as directed. Secured With: [employer], 4.5x3.1 (in/yd) Every Other Day/30  Days Discharge Instructions: Secure with Kerlix as directed. 1. Would recommend that we going continue with wound care measures as before and the patient is in agreement with plan this includes the use of the Iodosorb to the right heel which I do believe is going to do quite well. 2. Also can a look at the continuation of silver collagen to the wounds on the left leg otherwise which seem to be doing well as well as the right ankle. 3. I am also going to suggest we use Xeroform gauze to the new skin tear on the right leg which I think should do quite well for the patient. We will see patient back for reevaluation in 2 weeks here in the clinic. If anything worsens or changes patient will contact our office for additional recommendations. Electronic Signature(s) Signed: 07/19/2021 3:39:12 PM By: [name] PA-C Entered By: [name] on 07/19/2021 15:39:12 -------------------------------------------------------------------------------- SuperBill Details Patient Name: Date of Service: [name] [name], [name] V ID I. 07/19/2021 Medical Record Number: [id number] Patient Account Number: [id number] Date of Birth/Sex: Treating RN: Apr 19, 1926 (88 y.o. [name], [name] Primary Care Provider: [name] Other Clinician: Referring Provider: Treating Provider/Extender: Eli Hose in Treatment: 35 Diagnosis Coding ICD-10 Codes Code Description [phone] Pressure ulcer of right heel, stage 3 I73.89 Other specified peripheral vascular diseases I87.2 Venous insufficiency (chronic) (peripheral) L97.312 Non-pressure chronic ulcer of right ankle with fat  layer exposed L97.822 Non-pressure chronic ulcer of other part of left lower leg with fat layer exposed L89.153 Pressure ulcer of sacral region, stage 3 S71.111D Laceration without foreign body, right thigh, subsequent encounter T81.31XA Disruption of external operation (surgical) wound, not elsewhere classified, initial encounter N40.1 Benign prostatic hyperplasia with lower urinary tract symptoms N18.30 Chronic kidney disease, stage 3 unspecified I10 Essential (primary) hypertension Facility Procedures CPT4 Code: [id number] Description: 81829 - DEB SUBQ TISSUE 20 SQ CM/< ICD-10 Diagnosis Description L89.613 Pressure ulcer of right heel, stage 3 L97.822 Non-pressure chronic ulcer of other part of left lower leg with fat layer expo Modifier: sed Quantity: 1 CPT4 Code: [id number] Description: 11045 - DEB SUBQ TISS EA ADDL 20CM ICD-10 Diagnosis Description L97.822 Non-pressure chronic ulcer of other part of left lower leg with fat layer expo L89.613 Pressure ulcer of right heel, stage 3 Modifier: sed Quantity: 1 Physician Procedures : CPT4 Code Description Modifier [id number] 11042 - WC PHYS SUBQ TISS 20 SQ CM ICD-10 Diagnosis Description L89.613 Pressure ulcer of right heel, stage 3 L97.822 Non-pressure chronic ulcer of other part of left lower leg with fat layer exposed Quantity: 1 : [id number] 11045 - WC PHYS SUBQ TISS EA ADDL 20 CM ICD-10 Diagnosis Description L97.822 Non-pressure chronic ulcer of other part of left lower leg with fat layer exposed L89.613 Pressure ulcer of right heel, stage 3 Quantity: 1 Electronic Signature(s) Signed: 07/19/2021 3:40:02 PM By: [name] PA-C Entered By: [name] on 07/19/2021 15:40:01

## 2021-07-19 NOTE — Progress Notes (Signed)
[name] [name], [name] [name]. ([id number]) Visit Report for 07/19/2021 Arrival Information Details Patient Name: Date of Service: [name] [name] ID [name]. 07/19/2021 2:15 PM Medical Record Number: [id number] Patient Account Number: [id number] Date of Birth/Sex: Treating RN: 10-12-1926 (88 y.o. [name] [name] Primary Care Azaria Bartell: [name] Other Clinician: Referring Sabrea Sankey: Treating Daiana Vitiello/Extender: Eli Hose in Treatment: 27 Visit Information History Since Last Visit Added or deleted any medications: No Patient Arrived: Wheel Chair Any new allergies or adverse reactions: No Arrival Time: 14:20 Had a fall or experienced change in No Accompanied By: wife activities of daily living that may affect Transfer Assistance: Manual risk of falls: Patient Identification Verified: Yes Signs or symptoms of abuse/neglect since last visito No Secondary Verification Process Completed: Yes Hospitalized since last visit: No Patient Requires Transmission-Based Precautions: No Implantable device outside of the clinic excluding No Patient Has Alerts: Yes cellular tissue based products placed in the center Patient Alerts: Patient on Blood Thinner since last visit: Has Dressing in Place as Prescribed: Yes Pain Present Now: No Notes UTI on abx bactrim DS x7 days. Per patient had two shots in lower back for back pain. Electronic Signature(s) Signed: 07/19/2021 4:20:13 PM By: Deon Pilling RN, BSN Entered By: Deon Pilling on 07/19/2021 14:23:33 -------------------------------------------------------------------------------- Encounter Discharge Information Details Patient Name: Date of Service: [name] [name], [name] [name] ID [name]. 07/19/2021 2:15 PM Medical Record Number: [id number] Patient Account Number: [id number] Date of Birth/Sex: Treating RN: [birth year] (88 y.o. [name] [name] Primary Care Yarelis Ambrosino: [name] Other Clinician: Referring Ladoris Lythgoe: Treating Deazia Lampi/Extender: Eli Hose in Treatment: 15 Encounter Discharge Information Items Post Procedure Vitals Discharge Condition: Stable Temperature (F): 97.7 Ambulatory Status: Wheelchair Pulse (bpm): 74 Discharge Destination: Home Respiratory Rate (breaths/min): 20 Transportation: Private Auto Blood Pressure (mmHg): 137/70 Accompanied By: wife Schedule Follow-up Appointment: Yes Clinical Summary of Care: Electronic Signature(s) Signed: 07/19/2021 4:20:13 PM By: Deon Pilling RN, BSN Entered By: Deon Pilling on 07/19/2021 15:15:06 -------------------------------------------------------------------------------- Lower Extremity Assessment Details Patient Name: Date of Service: [name] [name], [name] [name] ID [name]. 07/19/2021 2:15 PM Medical Record Number: [id number] Patient Account Number: [id number] Date of Birth/Sex: Treating RN: 1926/04/28 (88 y.o. [name] [name] Primary Care Harmony Sandell: [name] Other Clinician: Referring Kolden Dupee: Treating Janeice Stegall/Extender: [name] in Treatment: 43 Edema Assessment Assessed: [Left: Yes] Patrice Paradise: Yes] Edema: [Left: Yes] [Right: Yes] Calf Left: Right: Point of Measurement: 34 cm From Medial Instep 41.5 cm 34.5 cm Ankle Left: Right: Point of Measurement: 11 cm From Medial Instep 23 cm 27 cm Vascular Assessment Pulses: Dorsalis Pedis Palpable: [Left:Yes] [Right:Yes] Electronic Signature(s) Signed: 07/19/2021 4:20:13 PM By: Deon Pilling RN, BSN Entered By: Deon Pilling on 07/19/2021 14:35:29 -------------------------------------------------------------------------------- Multi-Disciplinary Care Plan Details Patient Name: Date of Service: [name] [name], [name] [name] ID [name]. 07/19/2021 2:15 PM Medical Record Number: [id number] Patient Account Number: [id number] Date of Birth/Sex: Treating RN: 09/06/1926 (88 y.o. [name] [name] Primary Care Finleigh Cheong: [name] Other Clinician: Referring [place] Gustafson: Treating Madeliene Tejera/Extender: Eli Hose in Treatment: 68 Active Inactive Abuse / Safety / Falls / Self Care Management Nursing Diagnoses: Potential for falls Goals: Patient will remain injury free related to falls Date Initiated: 03/22/2021 Target Resolution Date: 07/28/2021 Goal Status: Active Interventions: Assess: immobility, friction, shearing, incontinence upon admission and as needed Assess impairment of mobility on admission and as needed per policy Assess self care needs on admission and as needed Provide  education on fall prevention Notes: Nutrition Nursing Diagnoses: Potential for alteratiion in Nutrition/Potential for imbalanced nutrition Goals: Patient/caregiver agrees to and verbalizes understanding of need to obtain nutritional consultation Date Initiated: 03/22/2021 T arget Resolution Date: 07/28/2021 Goal Status: Active Interventions: Provide education on nutrition Treatment Activities: Education provided on Nutrition : 03/22/2021 Giving encouragement to exercise : 03/22/2021 Notes: Electronic Signature(s) Signed: 07/19/2021 4:20:13 PM By: Deon Pilling RN, BSN Entered By: Deon Pilling on 07/19/2021 14:38:47 -------------------------------------------------------------------------------- Pain Assessment Details Patient Name: Date of Service: [name] [name], [name] [name] ID [name]. 07/19/2021 2:15 PM Medical Record Number: [id number] Patient Account Number: [id number] Date of Birth/Sex: Treating RN: 11-05-1926 (88 y.o. [name] [name] Primary Care Trayton Szabo: [name] Other Clinician: Referring Armonee Bojanowski: Treating Sadie Hazelett/Extender: Eli Hose in Treatment: 72 Active Problems Location of Pain Severity and Description of Pain Patient Has Paino No Site Locations Rate the pain. Current Pain Level: 0 Pain Management and Medication Current Pain Management: Medication: No Cold Application: No Rest: No Massage: No Activity: No T.E.N.S.: No Heat  Application: No Leg drop or elevation: No Is the Current Pain Management Adequate: Adequate How does your wound impact your activities of daily livingo Sleep: No Bathing: No Appetite: No Relationship With Others: No Bladder Continence: No Emotions: No Bowel Continence: No Work: No Toileting: No Drive: No Dressing: No Hobbies: No Engineer, maintenance) Signed: 07/19/2021 4:20:13 PM By: Deon Pilling RN, BSN Entered By: Deon Pilling on 07/19/2021 14:24:03 -------------------------------------------------------------------------------- Patient/Caregiver Education Details Patient Name: Date of Service: [name] [name] ID [name]. 7/19/2023andnbsp2:15 PM Medical Record Number: [id number] Patient Account Number: [id number] Date of Birth/Gender: Treating RN: [birth year] (88 y.o. [name] [name] Primary Care Physician: [name] Other Clinician: Referring Physician: Treating Physician/Extender: Eli Hose in Treatment: 52 Education Assessment Education Provided To: Patient Education Topics Provided Nutrition: Handouts: Nutrition Methods: Explain/Verbal Responses: Reinforcements needed Electronic Signature(s) Signed: 07/19/2021 4:20:13 PM By: Deon Pilling RN, BSN Entered By: Deon Pilling on 07/19/2021 14:39:00 -------------------------------------------------------------------------------- Wound Assessment Details Patient Name: Date of Service: [name] [name], [name] [name] ID [name]. 07/19/2021 2:15 PM Medical Record Number: [id number] Patient Account Number: [id number] Date of Birth/Sex: Treating RN: 1926-05-22 (88 y.o. [name], [name] Primary Care Harlynn Kimbell: [name] Other Clinician: Referring Amilyah Nack: Treating Rube Sanchez/Extender: Eli Hose in Treatment: 32 Wound Status Wound Number: 14 Primary Open Surgical Wound Etiology: Wound Location: Left, Anterior Lower Leg Wound Open Wounding Event: Surgical Injury Status: Date Acquired:  05/26/2021 Comorbid Cataracts, Sleep Apnea, Arrhythmia, Congestive Heart Failure, Weeks Of Treatment: 2 History: Coronary Artery Disease, Hypertension, Myocardial Infarction, Clustered Wound: No End Stage Renal Disease, Gout, Dementia, Neuropathy Photos Wound Measurements Length: (cm) 2.7 Width: (cm) 2 Depth: (cm) 0.2 Area: (cm) 4.241 Volume: (cm) 0.848 % Reduction in Area: -8% % Reduction in Volume: -8% Epithelialization: None Tunneling: No Undermining: No Wound Description Classification: Full Thickness Without Exposed Support Structu Wound Margin: Distinct, outline attached Exudate Amount: Medium Exudate Type: Serosanguineous Exudate Color: red, brown res Foul Odor After Cleansing: No Slough/Fibrino Yes Wound Bed Granulation Amount: Small (1-33%) Exposed Structure Granulation Quality: Red Fascia Exposed: No Necrotic Amount: Large (67-100%) Fat Layer (Subcutaneous Tissue) Exposed: Yes Necrotic Quality: Adherent Slough Tendon Exposed: No Muscle Exposed: No Joint Exposed: No Bone Exposed: No Treatment Notes Wound #14 (Lower Leg) Wound Laterality: Left, Anterior Cleanser Soap and Water Discharge Instruction: May shower and wash wound with dial antibacterial soap and water prior to dressing change. Peri-Wound Care Topical  Primary Dressing Promogran Prisma Matrix, 4.34 (sq in) (silver collagen) Discharge Instruction: Moisten collagen with hydrogel or KY jelly. Secondary Dressing Woven Gauze Sponge, Non-Sterile 4x4 in Discharge Instruction: Apply over primary dressing as directed. Zetuvit Plus Silicone Border Dressing 4x4 (in/in) Discharge Instruction: Apply silicone border over primary dressing as directed. Secured With Elastic Bandage 4 inch (ACE bandage) Discharge Instruction: Secure with ACE bandage as directed. Kerlix Roll Sterile, 4.5x3.1 (in/yd) Discharge Instruction: Secure with Kerlix as directed. Compression Wrap Compression  Stockings Add-Ons Electronic Signature(s) Signed: 07/19/2021 3:34:17 PM By: [name] Hammock RN Signed: 07/19/2021 4:20:13 PM By: Deon Pilling RN, BSN Entered By: [name] [name] on 07/19/2021 14:39:31 -------------------------------------------------------------------------------- Wound Assessment Details Patient Name: Date of Service: [name] [name], [name] [name] ID [name]. 07/19/2021 2:15 PM Medical Record Number: [id number] Patient Account Number: [id number] Date of Birth/Sex: Treating RN: 30-Jan-1926 (88 y.o. [name], [name] Primary Care Sharalee Witman: [name] Other Clinician: Referring Rykker Coviello: Treating Jairen Goldfarb/Extender: Eli Hose in Treatment: 57 Wound Status Wound Number: 15 Primary Pressure Ulcer Etiology: Wound Location: Right Calcaneus Secondary Abrasion Wounding Event: Trauma Etiology: Date Acquired: 07/12/2021 Wound Open Weeks Of Treatment: 0 Status: Clustered Wound: No Comorbid Cataracts, Sleep Apnea, Arrhythmia, Congestive Heart Failure, History: Coronary Artery Disease, Hypertension, Myocardial Infarction, End Stage Renal Disease, Gout, Dementia, Neuropathy Photos Wound Measurements Length: (cm) 2.5 Width: (cm) 5.5 Depth: (cm) 0.1 Area: (cm) 10.799 Volume: (cm) 1.08 % Reduction in Area: 0% % Reduction in Volume: 0% Epithelialization: Small (1-33%) Tunneling: No Undermining: No Wound Description Classification: Category/Stage III Wound Margin: Distinct, outline attached Exudate Amount: Medium Exudate Type: Serosanguineous Exudate Color: red, brown Foul Odor After Cleansing: No Slough/Fibrino Yes Wound Bed Granulation Amount: Small (1-33%) Exposed Structure Granulation Quality: Red, Pink Fascia Exposed: No Necrotic Amount: Large (67-100%) Fat Layer (Subcutaneous Tissue) Exposed: Yes Necrotic Quality: Eschar, Adherent Slough Tendon Exposed: No Muscle Exposed: No Joint Exposed: No Bone Exposed: No Treatment Notes Wound #15  (Calcaneus) Wound Laterality: Right Cleanser Soap and Water Discharge Instruction: May shower and wash wound with dial antibacterial soap and water prior to dressing change. Wound Cleanser Discharge Instruction: Cleanse the wound with wound cleanser prior to applying a clean dressing using gauze sponges, not tissue or cotton balls. Peri-Wound Care Topical Primary Dressing Iodosorb Gel 10 (gm) Tube Discharge Instruction: Apply to wound bed as instructed Secondary Dressing ALLEVYN Heel 4 1/2in x 5 1/2in / 10.5cm x 13.5cm Discharge Instruction: Apply over primary dressing as directed. Woven Gauze Sponge, Non-Sterile 4x4 in Discharge Instruction: Apply over primary dressing as directed. Secured With Compression Wrap Compression Stockings Environmental education officer) Signed: 07/19/2021 3:34:17 PM By: [name] Hammock RN Signed: 07/19/2021 4:20:13 PM By: Deon Pilling RN, BSN Entered By: [name] [name] on 07/19/2021 14:39:59 -------------------------------------------------------------------------------- Wound Assessment Details Patient Name: Date of Service: [name] [name], [name] [name] ID [name]. 07/19/2021 2:15 PM Medical Record Number: [id number] Patient Account Number: [id number] Date of Birth/Sex: Treating RN: 10/29/1926 (88 y.o. [name], [name] Primary Care Oretha Weismann: [name] Other Clinician: Referring Trent Gabler: Treating Sanika Brosious/Extender: Eli Hose in Treatment: 79 Wound Status Wound Number: 16 Primary Abrasion Etiology: Wound Location: Right, Lateral Lower Leg Wound Open Wounding Event: Trauma Status: Date Acquired: 07/12/2021 Comorbid Cataracts, Sleep Apnea, Arrhythmia, Congestive Heart Failure, Weeks Of Treatment: 0 History: Coronary Artery Disease, Hypertension, Myocardial Infarction, Clustered Wound: No End Stage Renal Disease, Gout, Dementia, Neuropathy Photos Wound Measurements Length: (cm) 5 Width: (cm) 3 Depth: (cm) 0.1 Area: (cm)  11.781 Volume: (cm) 1.178 Wound  Description Classification: Full Thickness Without Exposed Support Structures Wound Margin: Distinct, outline attached Exudate Amount: Medium Exudate Type: Serosanguineous Exudate Color: red, brown Foul Odor After Cleansing: Slough/Fibrino % Reduction in Area: 0% % Reduction in Volume: 0% Epithelialization: Small (1-33%) Tunneling: No Undermining: No No No Wound Bed Granulation Amount: Large (67-100%) Exposed Structure Granulation Quality: Red Fascia Exposed: No Necrotic Amount: None Present (0%) Fat Layer (Subcutaneous Tissue) Exposed: Yes Tendon Exposed: No Muscle Exposed: No Joint Exposed: No Bone Exposed: No Treatment Notes Wound #16 (Lower Leg) Wound Laterality: Right, Lateral Cleanser Soap and Water Discharge Instruction: May shower and wash wound with dial antibacterial soap and water prior to dressing change. Wound Cleanser Discharge Instruction: Cleanse the wound with wound cleanser prior to applying a clean dressing using gauze sponges, not tissue or cotton balls. Peri-Wound Care Topical Primary Dressing Xeroform Occlusive Gauze Dressing, 4x4 in Discharge Instruction: Apply to wound bed as instructed Secondary Dressing ABD Pad, 8x10 Discharge Instruction: Apply over primary dressing as directed. Secured With Elastic Bandage 4 inch (ACE bandage) Discharge Instruction: Secure with ACE bandage as directed. Kerlix Roll Sterile, 4.5x3.1 (in/yd) Discharge Instruction: Secure with Kerlix as directed. Compression Wrap Compression Stockings Add-Ons Electronic Signature(s) Signed: 07/19/2021 3:34:17 PM By: [name] Hammock RN Signed: 07/19/2021 4:20:13 PM By: Deon Pilling RN, BSN Entered By: [name] [name] on 07/19/2021 14:40:22 -------------------------------------------------------------------------------- Wound Assessment Details Patient Name: Date of Service: [name] [name], [name] [name] ID [name]. 07/19/2021 2:15 PM Medical Record Number:  [id number] Patient Account Number: [id number] Date of Birth/Sex: Treating RN: 05/14/1926 (88 y.o. [name], [name] Primary Care Elizjah Noblet: [name] Other Clinician: Referring Joseluis Alessio: Treating Draeden Kellman/Extender: Eli Hose in Treatment: 40 Wound Status Wound Number: 3 Primary Venous Leg Ulcer Etiology: Wound Location: Left, Lateral Ankle Wound Open Wounding Event: Gradually Appeared Status: Date Acquired: 10/12/2020 Comorbid Cataracts, Sleep Apnea, Arrhythmia, Congestive Heart Failure, Weeks Of Treatment: 35 History: Coronary Artery Disease, Hypertension, Myocardial Infarction, Clustered Wound: No End Stage Renal Disease, Gout, Dementia, Neuropathy Photos Wound Measurements Length: (cm) 1.8 Width: (cm) 1.2 Depth: (cm) 0.1 Area: (cm) 1.696 Volume: (cm) 0.17 % Reduction in Area: -237.2% % Reduction in Volume: -12.6% Epithelialization: Large (67-100%) Tunneling: No Undermining: No Wound Description Classification: Full Thickness Without Exposed Support Structures Wound Margin: Epibole Exudate Amount: Medium Exudate Type: Serosanguineous Exudate Color: red, brown Foul Odor After Cleansing: No Slough/Fibrino Yes Wound Bed Granulation Amount: Large (67-100%) Exposed Structure Granulation Quality: Red Fascia Exposed: No Necrotic Amount: Small (1-33%) Fat Layer (Subcutaneous Tissue) Exposed: Yes Necrotic Quality: Adherent Slough Tendon Exposed: No Muscle Exposed: No Joint Exposed: No Bone Exposed: No Treatment Notes Wound #3 (Ankle) Wound Laterality: Left, Lateral Cleanser Soap and Water Discharge Instruction: May shower and wash wound with dial antibacterial soap and water prior to dressing change. Peri-Wound Care Topical Primary Dressing Promogran Prisma Matrix, 4.34 (sq in) (silver collagen) Discharge Instruction: Moisten collagen with hydrogel or KY jelly. Secondary Dressing Woven Gauze Sponge, Non-Sterile 4x4 in Discharge  Instruction: Apply over primary dressing as directed. Zetuvit Plus Silicone Border Dressing 4x4 (in/in) Discharge Instruction: Apply silicone border over primary dressing as directed. Secured With Compression Wrap Compression Stockings Environmental education officer) Signed: 07/19/2021 3:34:17 PM By: [name] Hammock RN Signed: 07/19/2021 4:20:13 PM By: Deon Pilling RN, BSN Entered By: [name] [name] on 07/19/2021 14:40:39 -------------------------------------------------------------------------------- Wound Assessment Details Patient Name: Date of Service: [name] [name], [name] [name] ID [name]. 07/19/2021 2:15 PM Medical Record Number: [id number] Patient Account Number: [id number] Date of Birth/Sex: Treating RN: 12/29/1926 (88  y.o. [name], [name] Primary Care Ketrick Matney: [name] Other Clinician: Referring Tanessa Tidd: Treating Kavin Weckwerth/Extender: Eli Hose in Treatment: 68 Wound Status Wound Number: 6 Primary Arterial Insufficiency Ulcer Etiology: Wound Location: Right, Lateral Ankle Wound Open Wounding Event: Gradually Appeared Status: Date Acquired: 11/22/2020 Comorbid Cataracts, Sleep Apnea, Arrhythmia, Congestive Heart Failure, Weeks Of Treatment: 30 History: Coronary Artery Disease, Hypertension, Myocardial Infarction, Clustered Wound: Yes End Stage Renal Disease, Gout, Dementia, Neuropathy Photos Wound Measurements Length: (cm) 0.8 Width: (cm) 0.3 Depth: (cm) 0.2 Clustered Quantity: 2 Area: (cm) 0. Volume: (cm) 0. % Reduction in Area: 31.6% % Reduction in Volume: 72.3% Epithelialization: Large (67-100%) Tunneling: No 188 Undermining: No 038 Wound Description Classification: Full Thickness Without Exposed Support Stru Wound Margin: Distinct, outline attached Exudate Amount: Medium Exudate Type: Serosanguineous Exudate Color: red, brown ctures Foul Odor After Cleansing: No Slough/Fibrino Yes Wound Bed Granulation Amount: Large (67-100%)  Exposed Structure Granulation Quality: Pink Fascia Exposed: No Necrotic Amount: Small (1-33%) Fat Layer (Subcutaneous Tissue) Exposed: Yes Necrotic Quality: Adherent Slough Tendon Exposed: No Muscle Exposed: No Joint Exposed: No Bone Exposed: No Treatment Notes Wound #6 (Ankle) Wound Laterality: Right, Lateral Cleanser Soap and Water Discharge Instruction: May shower and wash wound with dial antibacterial soap and water prior to dressing change. Peri-Wound Care Topical Primary Dressing Promogran Prisma Matrix, 4.34 (sq in) (silver collagen) Discharge Instruction: Moisten collagen with hydrogel or KY jelly. Secondary Dressing Woven Gauze Sponge, Non-Sterile 4x4 in Discharge Instruction: Apply over primary dressing as directed. Zetuvit Plus Silicone Border Dressing 4x4 (in/in) Discharge Instruction: Apply silicone border over primary dressing as directed. Secured With Elastic Bandage 4 inch (ACE bandage) Discharge Instruction: Secure with ACE bandage as directed. Kerlix Roll Sterile, 4.5x3.1 (in/yd) Discharge Instruction: Secure with Kerlix as directed. Compression Wrap Compression Stockings Add-Ons Electronic Signature(s) Signed: 07/19/2021 3:34:17 PM By: [name] Hammock RN Signed: 07/19/2021 4:20:13 PM By: Deon Pilling RN, BSN Entered By: [name] [name] on 07/19/2021 14:40:54 -------------------------------------------------------------------------------- Vitals Details Patient Name: Date of Service: [name] [name], [name] [name] ID [name]. 07/19/2021 2:15 PM Medical Record Number: [id number] Patient Account Number: [id number] Date of Birth/Sex: Treating RN: 02/18/1926 (88 y.o. [name] [name] Primary Care Samhitha Rosen: [name] Other Clinician: Referring Ammy Lienhard: Treating Bethene Hankinson/Extender: Eli Hose in Treatment: 18 Vital Signs Time Taken: 14:20 Temperature (F): 97.7 Height (in): 72 Pulse (bpm): 74 Weight (lbs): 223 Respiratory Rate  (breaths/min): 20 Body Mass Index (BMI): 30.2 Blood Pressure (mmHg): 137/70 Reference Range: 80 - 120 mg / dl Electronic Signature(s) Signed: 07/19/2021 4:20:13 PM By: Deon Pilling RN, BSN Entered By: Deon Pilling on 07/19/2021 14:23:56

## 2021-07-24 ENCOUNTER — Other Ambulatory Visit (HOSPITAL_COMMUNITY): Payer: Self-pay

## 2021-07-26 DIAGNOSIS — I4891 Unspecified atrial fibrillation: Secondary | ICD-10-CM | POA: Diagnosis not present

## 2021-07-26 DIAGNOSIS — I259 Chronic ischemic heart disease, unspecified: Secondary | ICD-10-CM | POA: Diagnosis not present

## 2021-07-26 DIAGNOSIS — G4733 Obstructive sleep apnea (adult) (pediatric): Secondary | ICD-10-CM | POA: Diagnosis not present

## 2021-07-26 DIAGNOSIS — I5021 Acute systolic (congestive) heart failure: Secondary | ICD-10-CM | POA: Diagnosis not present

## 2021-07-27 DIAGNOSIS — M1991 Primary osteoarthritis, unspecified site: Secondary | ICD-10-CM | POA: Diagnosis not present

## 2021-07-27 DIAGNOSIS — M109 Gout, unspecified: Secondary | ICD-10-CM | POA: Diagnosis not present

## 2021-07-27 DIAGNOSIS — M353 Polymyalgia rheumatica: Secondary | ICD-10-CM | POA: Diagnosis not present

## 2021-07-27 DIAGNOSIS — M0600 Rheumatoid arthritis without rheumatoid factor, unspecified site: Secondary | ICD-10-CM | POA: Diagnosis not present

## 2021-07-31 NOTE — Procedures (Signed)
Lumbosacral Transforaminal Epidural Steroid Injection - Sub-Pedicular Approach with Fluoroscopic Guidance  Patient: [name] [name]      Date of Birth: 1926/05/21 MRN: [id number] PCP: [name], MD      Visit Date: 07/17/2021   Universal Protocol:    Date/Time: 07/17/2021  Consent Given By: the patient  Position: PRONE  Additional Comments: Vital signs were monitored before and after the procedure. Patient was prepped and draped in the usual sterile fashion. The correct patient, procedure, and site was verified.   Injection Procedure Details:   Procedure diagnoses: Lumbar radiculopathy [M54.16]    Meds Administered:  Meds ordered this encounter  Medications   methylPREDNISolone acetate (DEPO-MEDROL) injection 80 mg    Laterality: Bilateral  Location/Site: L4  Needle:5.0 in., 22 ga.  Short bevel or Quincke spinal needle  Needle Placement: Transforaminal  Findings:    -Comments: Excellent flow of contrast along the nerve, nerve root and into the epidural space.  Procedure Details: After squaring off the end-plates to get a true AP view, the C-arm was positioned so that an oblique view of the foramen as noted above was visualized. The target area is just inferior to the "nose of the scotty dog" or sub pedicular. The soft tissues overlying this structure were infiltrated with 2-3 ml. of 1% Lidocaine without Epinephrine.  The spinal needle was inserted toward the target using a "trajectory" view along the fluoroscope beam.  Under AP and lateral visualization, the needle was advanced so it did not puncture dura and was located close the 6 O'Clock position of the pedical in AP tracterory. Biplanar projections were used to confirm position. Aspiration was confirmed to be negative for CSF and/or blood. A 1-2 ml. volume of Isovue-250 was injected and flow of contrast was noted at each level. Radiographs were obtained for documentation purposes.   After attaining the desired flow  of contrast documented above, a 0.5 to 1.0 ml test dose of 0.25% Marcaine was injected into each respective transforaminal space.  The patient was observed for 90 seconds post injection.  After no sensory deficits were reported, and normal lower extremity motor function was noted,   the above injectate was administered so that equal amounts of the injectate were placed at each foramen (level) into the transforaminal epidural space.   Additional Comments:  The patient tolerated the procedure well Dressing: 2 x 2 sterile gauze and Band-Aid    Post-procedure details: Patient was observed during the procedure. Post-procedure instructions were reviewed.  Patient left the clinic in stable condition.

## 2021-07-31 NOTE — Progress Notes (Signed)
[name] [name] - 88 y.o. male MRN [id number]  Date of birth: 1926/05/15  Office Visit Note: Visit Date: 07/17/2021 PCP: [name], MD Referred by: Suella Broad, MD  Subjective: Chief Complaint  Patient presents with   Lower Back - Pain   Right Leg - Pain   Left Leg - Pain   HPI:  [name] [name] is a 88 y.o. male who comes in today at the request of Barnet Pall, FNP for planned Bilateral L4-5 Lumbar Transforaminal epidural steroid injection with fluoroscopic guidance.  The patient has failed conservative care including home exercise, medications, time and activity modification.  This injection will be diagnostic and hopefully therapeutic.  Please see requesting physician notes for further details and justification.  Referring: Suella Broad, MD   ROS Otherwise per HPI.  Assessment & Plan: Visit Diagnoses:    ICD-10-CM   1. Lumbar radiculopathy  M54.16 XR C-ARM NO REPORT    Epidural Steroid injection    methylPREDNISolone acetate (DEPO-MEDROL) injection 80 mg      Plan: No additional findings.   Meds & Orders:  Meds ordered this encounter  Medications   methylPREDNISolone acetate (DEPO-MEDROL) injection 80 mg    Orders Placed This Encounter  Procedures   XR C-ARM NO REPORT   Epidural Steroid injection    Follow-up: Return for visit to requesting provider as needed.   Procedures: No procedures performed  Lumbosacral Transforaminal Epidural Steroid Injection - Sub-Pedicular Approach with Fluoroscopic Guidance  Patient: [name] [name]      Date of Birth: 1926/04/29 MRN: [id number] PCP: [name], MD      Visit Date: 07/17/2021   Universal Protocol:    Date/Time: 07/17/2021  Consent Given By: the patient  Position: PRONE  Additional Comments: Vital signs were monitored before and after the procedure. Patient was prepped and draped in the usual sterile fashion. The correct patient, procedure, and site was verified.   Injection Procedure Details:    Procedure diagnoses: Lumbar radiculopathy [M54.16]    Meds Administered:  Meds ordered this encounter  Medications   methylPREDNISolone acetate (DEPO-MEDROL) injection 80 mg    Laterality: Bilateral  Location/Site: L4  Needle:5.0 in., 22 ga.  Short bevel or Quincke spinal needle  Needle Placement: Transforaminal  Findings:    -Comments: Excellent flow of contrast along the nerve, nerve root and into the epidural space.  Procedure Details: After squaring off the end-plates to get a true AP view, the C-arm was positioned so that an oblique view of the foramen as noted above was visualized. The target area is just inferior to the "nose of the scotty dog" or sub pedicular. The soft tissues overlying this structure were infiltrated with 2-3 ml. of 1% Lidocaine without Epinephrine.  The spinal needle was inserted toward the target using a "trajectory" view along the fluoroscope beam.  Under AP and lateral visualization, the needle was advanced so it did not puncture dura and was located close the 6 O'Clock position of the pedical in AP tracterory. Biplanar projections were used to confirm position. Aspiration was confirmed to be negative for CSF and/or blood. A 1-2 ml. volume of Isovue-250 was injected and flow of contrast was noted at each level. Radiographs were obtained for documentation purposes.   After attaining the desired flow of contrast documented above, a 0.5 to 1.0 ml test dose of 0.25% Marcaine was injected into each respective transforaminal space.  The patient was observed for 90 seconds post injection.  After no sensory deficits were  reported, and normal lower extremity motor function was noted,   the above injectate was administered so that equal amounts of the injectate were placed at each foramen (level) into the transforaminal epidural space.   Additional Comments:  The patient tolerated the procedure well Dressing: 2 x 2 sterile gauze and Band-Aid    Post-procedure  details: Patient was observed during the procedure. Post-procedure instructions were reviewed.  Patient left the clinic in stable condition.    Clinical History: CT LUMBAR SPINE WITHOUT CONTRAST   TECHNIQUE: Multidetector CT imaging of the lumbar spine was performed without intravenous contrast administration. Multiplanar CT image reconstructions were also generated.   RADIATION DOSE REDUCTION: This exam was performed according to the departmental dose-optimization program which includes automated exposure control, adjustment of the mA and/or kV according to patient size and/or use of iterative reconstruction technique.   COMPARISON:  08/17/2020   FINDINGS: Segmentation: 5 lumbar type vertebral bodies.   Alignment: Mild retropulsion of L1 fragments, unchanged since prior study. Otherwise normal alignments.   Vertebrae: Compression of the L1 vertebra with some retropulsion of fracture fragments. Previous kyphoplasty cement. Appearance is unchanged since the prior study. No new compression deformities. Diffuse bone demineralization. No focal bone lesion or bone destruction. Visualized sacrum appears intact.   Paraspinal and other soft tissues: No abnormal paraspinal soft tissue mass or infiltration. Small bilateral pleural effusions are seen. Calcification of the aorta. Aortic aneurysm is demonstrated with maximal AP diameter of 4.3 cm. Similar appearance to previous study.   Disc levels: Degenerative disc space narrowing most prominent at L1-2 and L5-S1. Degenerative disc disease at L1-2. Endplate osteophyte formation throughout. There is evidence of moderate to severe central stenosis at T12-L1, L1-2, L2-3, L3-4, L4-5 levels. Degenerative changes in the facet joints.   IMPRESSION: 1. Old compression of L1 post kyphoplasty. Retropulsion of fracture fragments. No change in appearance since prior study. 2. No new acute fractures identified. 3. Diffuse multilevel  degenerative changes. 4. 4.3 cm diameter abdominal aortic aneurysm is unchanged. Bilateral pleural effusions.     Electronically Signed   By: Lucienne Capers M.D.   On: 03/14/2021 23:26     Objective:  VS:  HT:    WT:   BMI:     BP:(!) 121/54  HR:68bpm  TEMP: ( )  RESP:  Physical Exam Vitals and nursing note reviewed.  Constitutional:      General: He is not in acute distress.    Appearance: Normal appearance. He is not ill-appearing.  HENT:     Head: Normocephalic and atraumatic.     Right Ear: External ear normal.     Left Ear: External ear normal.     Nose: No congestion.  Eyes:     Extraocular Movements: Extraocular movements intact.  Cardiovascular:     Rate and Rhythm: Normal rate.     Pulses: Normal pulses.  Pulmonary:     Effort: Pulmonary effort is normal. No respiratory distress.  Abdominal:     General: There is no distension.     Palpations: Abdomen is soft.  Musculoskeletal:        General: No tenderness or signs of injury.     Cervical back: Neck supple.     Right lower leg: No edema.     Left lower leg: No edema.     Comments: Patient has good distal strength without clonus.  Skin:    Findings: No erythema or rash.  Neurological:     General: No focal deficit present.  Mental Status: He is alert and oriented to person, place, and time.     Sensory: No sensory deficit.     Motor: No weakness or abnormal muscle tone.     Coordination: Coordination normal.  Psychiatric:        Mood and Affect: Mood normal.        Behavior: Behavior normal.      Imaging: No results found.

## 2021-08-01 ENCOUNTER — Telehealth: Payer: Self-pay | Admitting: Physical Medicine and Rehabilitation

## 2021-08-01 NOTE — Telephone Encounter (Signed)
Patient called advised that the injection was successful except when he stand from a seated position the pain level goes from a 2 to a 7. Patient asked if he can get another injection. The number to contact patient is [phone]

## 2021-08-02 ENCOUNTER — Encounter (HOSPITAL_BASED_OUTPATIENT_CLINIC_OR_DEPARTMENT_OTHER): Payer: Medicare Other | Attending: Physician Assistant | Admitting: Physician Assistant

## 2021-08-02 DIAGNOSIS — I872 Venous insufficiency (chronic) (peripheral): Secondary | ICD-10-CM | POA: Diagnosis not present

## 2021-08-02 DIAGNOSIS — N401 Enlarged prostate with lower urinary tract symptoms: Secondary | ICD-10-CM | POA: Diagnosis not present

## 2021-08-02 DIAGNOSIS — L97812 Non-pressure chronic ulcer of other part of right lower leg with fat layer exposed: Secondary | ICD-10-CM | POA: Diagnosis not present

## 2021-08-02 DIAGNOSIS — I480 Paroxysmal atrial fibrillation: Secondary | ICD-10-CM | POA: Diagnosis not present

## 2021-08-02 DIAGNOSIS — L97312 Non-pressure chronic ulcer of right ankle with fat layer exposed: Secondary | ICD-10-CM | POA: Diagnosis not present

## 2021-08-02 DIAGNOSIS — Z8673 Personal history of transient ischemic attack (TIA), and cerebral infarction without residual deficits: Secondary | ICD-10-CM | POA: Diagnosis not present

## 2021-08-02 DIAGNOSIS — S71111A Laceration without foreign body, right thigh, initial encounter: Secondary | ICD-10-CM | POA: Insufficient documentation

## 2021-08-02 DIAGNOSIS — I7389 Other specified peripheral vascular diseases: Secondary | ICD-10-CM | POA: Insufficient documentation

## 2021-08-02 DIAGNOSIS — X58XXXA Exposure to other specified factors, initial encounter: Secondary | ICD-10-CM | POA: Insufficient documentation

## 2021-08-02 DIAGNOSIS — I129 Hypertensive chronic kidney disease with stage 1 through stage 4 chronic kidney disease, or unspecified chronic kidney disease: Secondary | ICD-10-CM | POA: Diagnosis not present

## 2021-08-02 DIAGNOSIS — L89613 Pressure ulcer of right heel, stage 3: Secondary | ICD-10-CM | POA: Diagnosis not present

## 2021-08-02 DIAGNOSIS — L97822 Non-pressure chronic ulcer of other part of left lower leg with fat layer exposed: Secondary | ICD-10-CM | POA: Insufficient documentation

## 2021-08-02 DIAGNOSIS — L89153 Pressure ulcer of sacral region, stage 3: Secondary | ICD-10-CM | POA: Diagnosis not present

## 2021-08-02 DIAGNOSIS — T8131XA Disruption of external operation (surgical) wound, not elsewhere classified, initial encounter: Secondary | ICD-10-CM | POA: Diagnosis not present

## 2021-08-02 DIAGNOSIS — R531 Weakness: Secondary | ICD-10-CM | POA: Diagnosis not present

## 2021-08-02 DIAGNOSIS — N183 Chronic kidney disease, stage 3 unspecified: Secondary | ICD-10-CM | POA: Diagnosis not present

## 2021-08-02 DIAGNOSIS — I63512 Cerebral infarction due to unspecified occlusion or stenosis of left middle cerebral artery: Secondary | ICD-10-CM | POA: Diagnosis not present

## 2021-08-02 DIAGNOSIS — Y838 Other surgical procedures as the cause of abnormal reaction of the patient, or of later complication, without mention of misadventure at the time of the procedure: Secondary | ICD-10-CM | POA: Insufficient documentation

## 2021-08-02 DIAGNOSIS — L97412 Non-pressure chronic ulcer of right heel and midfoot with fat layer exposed: Secondary | ICD-10-CM | POA: Diagnosis not present

## 2021-08-02 DIAGNOSIS — Z951 Presence of aortocoronary bypass graft: Secondary | ICD-10-CM | POA: Diagnosis not present

## 2021-08-02 DIAGNOSIS — I5020 Unspecified systolic (congestive) heart failure: Secondary | ICD-10-CM | POA: Diagnosis not present

## 2021-08-02 NOTE — Progress Notes (Signed)
DAL, BLEW I. ([id number]) Visit Report for 08/02/2021 Arrival Information Details Patient Name: Date of Service: [name] [name] ID I. 08/02/2021 2:15 PM Medical Record Number: [id number] Patient Account Number: [id number] Date of Birth/Sex: Treating RN: [birth year] (88 y.o. [name] [name] Primary Care Benney [place]: [name] Other Clinician: Referring Aprille Sawhney: Treating Jocob Dambach/Extender: Eli Hose in Treatment: 67 Visit Information History Since Last Visit Added or deleted any medications: No Patient Arrived: Wheel Chair Any new allergies or adverse reactions: No Arrival Time: 14:12 Had a fall or experienced change in No Accompanied By: daughter activities of daily living that may affect Transfer Assistance: Manual risk of falls: Patient Identification Verified: Yes Signs or symptoms of abuse/neglect since last visito No Secondary Verification Process Completed: Yes Hospitalized since last visit: No Patient Requires Transmission-Based Precautions: No Implantable device outside of the clinic excluding No Patient Has Alerts: Yes cellular tissue based products placed in the center Patient Alerts: Patient on Blood Thinner since last visit: Has Dressing in Place as Prescribed: Yes Pain Present Now: No Electronic Signature(s) Signed: 08/02/2021 2:29:48 PM By: Sandre Kitty Entered By: Sandre Kitty on 08/02/2021 14:13:19 -------------------------------------------------------------------------------- Clinic Level of Care Assessment Details Patient Name: Date of Service: [name] [name] ID I. 08/02/2021 2:15 PM Medical Record Number: [id number] Patient Account Number: [id number] Date of Birth/Sex: Treating RN: 04-30-1926 (88 y.o. [name] [name] Primary Care Linzie Boursiquot: [name] Other Clinician: Referring Anjelita Sheahan: Treating Bryson Palen/Extender: Eli Hose in Treatment: 45 Clinic Level of Care Assessment Items TOOL 4 Quantity  Score X- 1 0 Use when only an EandM is performed on FOLLOW-UP visit ASSESSMENTS - Nursing Assessment / Reassessment X- 1 10 Reassessment of Co-morbidities (includes updates in patient status) X- 1 5 Reassessment of Adherence to Treatment Plan ASSESSMENTS - Wound and Skin A ssessment / Reassessment '[]'$  - 0 Simple Wound Assessment / Reassessment - one wound X- 5 5 Complex Wound Assessment / Reassessment - multiple wounds X- 1 10 Dermatologic / Skin Assessment (not related to wound area) ASSESSMENTS - Focused Assessment X- 2 5 Circumferential Edema Measurements - multi extremities X- 1 10 Nutritional Assessment / Counseling / Intervention '[]'$  - 0 Lower Extremity Assessment (monofilament, tuning fork, pulses) '[]'$  - 0 Peripheral Arterial Disease Assessment (using hand held doppler) ASSESSMENTS - Ostomy and/or Continence Assessment and Care '[]'$  - 0 Incontinence Assessment and Management '[]'$  - 0 Ostomy Care Assessment and Management (repouching, etc.) PROCESS - Coordination of Care '[]'$  - 0 Simple Patient / Family Education for ongoing care X- 1 20 Complex (extensive) Patient / Family Education for ongoing care X- 1 10 Staff obtains Programmer, systems, Records, T Results / Process Orders est X- 1 10 Staff telephones HHA, Nursing Homes / Clarify orders / etc '[]'$  - 0 Routine Transfer to another Facility (non-emergent condition) '[]'$  - 0 Routine Hospital Admission (non-emergent condition) '[]'$  - 0 New Admissions / Biomedical engineer / Ordering NPWT Apligraf, etc. , '[]'$  - 0 Emergency Hospital Admission (emergent condition) '[]'$  - 0 Simple Discharge Coordination X- 1 15 Complex (extensive) Discharge Coordination PROCESS - Special Needs '[]'$  - 0 Pediatric / Minor Patient Management '[]'$  - 0 Isolation Patient Management '[]'$  - 0 Hearing / Language / Visual special needs '[]'$  - 0 Assessment of Community assistance (transportation, D/C planning, etc.) '[]'$  - 0 Additional assistance / Altered  mentation '[]'$  - 0 Support Surface(s) Assessment (bed, cushion, seat, etc.) INTERVENTIONS - Wound Cleansing / Measurement '[]'$  - 0 Simple Wound Cleansing -  one wound X- 5 5 Complex Wound Cleansing - multiple wounds X- 1 5 Wound Imaging (photographs - any number of wounds) '[]'$  - 0 Wound Tracing (instead of photographs) '[]'$  - 0 Simple Wound Measurement - one wound X- 5 5 Complex Wound Measurement - multiple wounds INTERVENTIONS - Wound Dressings X - Small Wound Dressing one or multiple wounds 4 10 X- 1 15 Medium Wound Dressing one or multiple wounds '[]'$  - 0 Large Wound Dressing one or multiple wounds '[]'$  - 0 Application of Medications - topical '[]'$  - 0 Application of Medications - injection INTERVENTIONS - Miscellaneous '[]'$  - 0 External ear exam '[]'$  - 0 Specimen Collection (cultures, biopsies, blood, body fluids, etc.) '[]'$  - 0 Specimen(s) / Culture(s) sent or taken to Lab for analysis '[]'$  - 0 Patient Transfer (multiple staff / Civil Service fast streamer / Similar devices) '[]'$  - 0 Simple Staple / Suture removal (25 or less) '[]'$  - 0 Complex Staple / Suture removal (26 or more) '[]'$  - 0 Hypo / Hyperglycemic Management (close monitor of Blood Glucose) '[]'$  - 0 Ankle / Brachial Index (ABI) - do not check if billed separately X- 1 5 Vital Signs Has the patient been seen at the hospital within the last three years: Yes Total Score: 240 Level Of Care: New/Established - Level 5 Electronic Signature(s) Signed: 08/02/2021 6:08:09 PM By: Deon Pilling RN, BSN Entered By: Deon Pilling on 08/02/2021 14:56:06 -------------------------------------------------------------------------------- Encounter Discharge Information Details Patient Name: Date of Service: [name] [name], [name] V ID I. 08/02/2021 2:15 PM Medical Record Number: [id number] Patient Account Number: [id number] Date of Birth/Sex: Treating RN: April 11, 1926 (88 y.o. [name] [name] Primary Care Katia Hannen: [name] Other Clinician: Referring Jodine Muchmore: Treating  Amine Adelson/Extender: Eli Hose in Treatment: 29 Encounter Discharge Information Items Discharge Condition: Stable Ambulatory Status: Wheelchair Discharge Destination: Home Transportation: Private Auto Accompanied By: wife Schedule Follow-up Appointment: Yes Clinical Summary of Care: Electronic Signature(s) Signed: 08/02/2021 6:08:09 PM By: Deon Pilling RN, BSN Entered By: Deon Pilling on 08/02/2021 14:56:44 -------------------------------------------------------------------------------- Lower Extremity Assessment Details Patient Name: Date of Service: [name] [name], [name] V ID I. 08/02/2021 2:15 PM Medical Record Number: [id number] Patient Account Number: [id number] Date of Birth/Sex: Treating RN: 1926-06-14 (88 y.o. [name] [name] Primary Care Kipling Graser: [name] Other Clinician: Referring Delanie Tirrell: Treating Suhas Estis/Extender: [name] in Treatment: 45 Edema Assessment Assessed: Shirlyn Goltz: Yes] Patrice Paradise: Yes] Edema: [Left: Yes] [Right: Yes] Calf Left: Right: Point of Measurement: 34 cm From Medial Instep 41.5 cm 38 cm Ankle Left: Right: Point of Measurement: 11 cm From Medial Instep 28 cm 28 cm Electronic Signature(s) Signed: 08/02/2021 6:08:09 PM By: Deon Pilling RN, BSN Entered By: Deon Pilling on 08/02/2021 14:21:20 -------------------------------------------------------------------------------- Multi-Disciplinary Care Plan Details Patient Name: Date of Service: [name] [name], [name] V ID I. 08/02/2021 2:15 PM Medical Record Number: [id number] Patient Account Number: [id number] Date of Birth/Sex: Treating RN: 12/28/1926 (88 y.o. [name] [name] Primary Care Nancie Bocanegra: [name] Other Clinician: Referring Emilynn Srinivasan: Treating Ellarose Brandi/Extender: Eli Hose in Treatment: 9 Active Inactive Abuse / Safety / Falls / Self Care Management Nursing Diagnoses: Potential for falls Goals: Patient will remain injury  free related to falls Date Initiated: 03/22/2021 Target Resolution Date: 09/01/2021 Goal Status: Active Interventions: Assess: immobility, friction, shearing, incontinence upon admission and as needed Assess impairment of mobility on admission and as needed per policy Assess self care needs on admission and as needed Provide education on fall prevention Notes:  Nutrition Nursing Diagnoses: Potential for alteratiion in Nutrition/Potential for imbalanced nutrition Goals: Patient/caregiver agrees to and verbalizes understanding of need to obtain nutritional consultation Date Initiated: 03/22/2021 T arget Resolution Date: 09/01/2021 Goal Status: Active Interventions: Provide education on nutrition Treatment Activities: Education provided on Nutrition : 07/19/2021 Giving encouragement to exercise : 03/22/2021 Notes: Electronic Signature(s) Signed: 08/02/2021 6:08:09 PM By: Deon Pilling RN, BSN Entered By: Deon Pilling on 08/02/2021 14:50:57 -------------------------------------------------------------------------------- Pain Assessment Details Patient Name: Date of Service: [name] [name], [name] V ID I. 08/02/2021 2:15 PM Medical Record Number: [id number] Patient Account Number: [id number] Date of Birth/Sex: Treating RN: 11/01/1926 (88 y.o. [name] [name] Primary Care Destenee Guerry: [name] Other Clinician: Referring Wilfrido Luedke: Treating Omni Dunsworth/Extender: Eli Hose in Treatment: 45 Active Problems Location of Pain Severity and Description of Pain Patient Has Paino Yes Site Locations Rate the pain. Current Pain Level: 5 Pain Management and Medication Current Pain Management: Electronic Signature(s) Signed: 08/02/2021 2:29:48 PM By: Sandre Kitty Signed: 08/02/2021 6:08:09 PM By: Deon Pilling RN, BSN Entered By: Sandre Kitty on 08/02/2021 14:13:51 -------------------------------------------------------------------------------- Patient/Caregiver Education  Details Patient Name: Date of Service: [name] [name], Shaune Pascal ID I. 8/2/2023andnbsp2:15 PM Medical Record Number: [id number] Patient Account Number: [id number] Date of Birth/Gender: Treating RN: May 18, 1926 (88 y.o. [name] [name] Primary Care Physician: [name] Other Clinician: Referring Physician: Treating Physician/Extender: Eli Hose in Treatment: 76 Education Assessment Education Provided To: Patient Education Topics Provided Nutrition: Handouts: Nutrition Methods: Explain/Verbal Responses: Reinforcements needed Electronic Signature(s) Signed: 08/02/2021 6:08:09 PM By: Deon Pilling RN, BSN Entered By: Deon Pilling on 08/02/2021 14:51:13 -------------------------------------------------------------------------------- Wound Assessment Details Patient Name: Date of Service: [name] [name], [name] V ID I. 08/02/2021 2:15 PM Medical Record Number: [id number] Patient Account Number: [id number] Date of Birth/Sex: Treating RN: 09-Apr-1926 (88 y.o. [name], [name] Primary Care Florian Chauca: [name] Other Clinician: Referring Betty Daidone: Treating Tylee Newby/Extender: [name] in Treatment: 45 Wound Status Wound Number: 14 Primary Open Surgical Wound Etiology: Wound Location: Left, Anterior Lower Leg Wound Open Wounding Event: Surgical Injury Status: Date Acquired: 05/26/2021 Comorbid Cataracts, Sleep Apnea, Arrhythmia, Congestive Heart Failure, Weeks Of Treatment: 4 History: Coronary Artery Disease, Hypertension, Myocardial Infarction, Clustered Wound: No End Stage Renal Disease, Gout, Dementia, Neuropathy Photos Wound Measurements Length: (cm) 2.3 Width: (cm) 2 Depth: (cm) 0.2 Area: (cm) 3.613 Volume: (cm) 0.723 % Reduction in Area: 8% % Reduction in Volume: 7.9% Epithelialization: Small (1-33%) Tunneling: No Undermining: No Wound Description Classification: Full Thickness Without Exposed Support Structures Wound Margin:  Distinct, outline attached Exudate Amount: Medium Exudate Type: Serosanguineous Exudate Color: red, brown Foul Odor After Cleansing: No Slough/Fibrino Yes Wound Bed Granulation Amount: Medium (34-66%) Exposed Structure Granulation Quality: Red Fascia Exposed: No Necrotic Amount: Medium (34-66%) Fat Layer (Subcutaneous Tissue) Exposed: Yes Necrotic Quality: Adherent Slough Tendon Exposed: No Muscle Exposed: No Joint Exposed: No Bone Exposed: No Treatment Notes Wound #14 (Lower Leg) Wound Laterality: Left, Anterior Cleanser Soap and Water Discharge Instruction: May shower and wash wound with dial antibacterial soap and water prior to dressing change. Peri-Wound Care Topical Primary Dressing Promogran Prisma Matrix, 4.34 (sq in) (silver collagen) Discharge Instruction: Moisten collagen with hydrogel or KY jelly. Secondary Dressing T Non-adherent Dressing, 2x3 in elfa Discharge Instruction: Apply over primary dressing as directed. Woven Gauze Sponges 2x2 in Discharge Instruction: Apply over primary dressing as directed. Secured With [employer], 4.5x3.1 (in/yd) Discharge Instruction: Secure with Kerlix as directed. Compression Wrap  Compression Stockings Add-Ons Electronic Signature(s) Signed: 08/02/2021 6:08:09 PM By: Deon Pilling RN, BSN Entered By: Deon Pilling on 08/02/2021 14:22:42 -------------------------------------------------------------------------------- Wound Assessment Details Patient Name: Date of Service: [name] [name], [name] V ID I. 08/02/2021 2:15 PM Medical Record Number: [id number] Patient Account Number: [id number] Date of Birth/Sex: Treating RN: 14-Oct-1926 (88 y.o. [name], [name] Primary Care Mikah Rottinghaus: [name] Other Clinician: Referring Addilee Neu: Treating Shaquilla Kehres/Extender: Eli Hose in Treatment: 45 Wound Status Wound Number: 15 Primary Pressure Ulcer Etiology: Wound Location: Right Calcaneus Secondary  Abrasion Wounding Event: Trauma Etiology: Date Acquired: 07/12/2021 Wound Open Weeks Of Treatment: 2 Status: Clustered Wound: No Comorbid Cataracts, Sleep Apnea, Arrhythmia, Congestive Heart Failure, History: Coronary Artery Disease, Hypertension, Myocardial Infarction, End Stage Renal Disease, Gout, Dementia, Neuropathy Photos Wound Measurements Length: (cm) 2 Width: (cm) 3.8 Depth: (cm) 0.1 Area: (cm) 5.969 Volume: (cm) 0.597 % Reduction in Area: 44.7% % Reduction in Volume: 44.7% Epithelialization: Medium (34-66%) Tunneling: No Undermining: No Wound Description Classification: Category/Stage III Wound Margin: Distinct, outline attached Exudate Amount: Medium Exudate Type: Serosanguineous Exudate Color: red, brown Foul Odor After Cleansing: No Slough/Fibrino Yes Wound Bed Granulation Amount: Small (1-33%) Exposed Structure Granulation Quality: Red, Pink Fascia Exposed: No Necrotic Amount: Large (67-100%) Fat Layer (Subcutaneous Tissue) Exposed: Yes Necrotic Quality: Eschar, Adherent Slough Tendon Exposed: No Muscle Exposed: No Joint Exposed: No Bone Exposed: No Treatment Notes Wound #15 (Calcaneus) Wound Laterality: Right Cleanser Soap and Water Discharge Instruction: May shower and wash wound with dial antibacterial soap and water prior to dressing change. Wound Cleanser Discharge Instruction: Cleanse the wound with wound cleanser prior to applying a clean dressing using gauze sponges, not tissue or cotton balls. Peri-Wound Care Topical Primary Dressing Iodosorb Gel 10 (gm) Tube Discharge Instruction: Apply to wound bed as instructed Secondary Dressing ALLEVYN Heel 4 1/2in x 5 1/2in / 10.5cm x 13.5cm Discharge Instruction: Apply over primary dressing as directed. Woven Gauze Sponge, Non-Sterile 4x4 in Discharge Instruction: Apply over primary dressing as directed. Secured With Compression Wrap Compression Stockings Sport and exercise psychologist) Signed: 08/02/2021 6:08:09 PM By: Deon Pilling RN, BSN Entered By: Deon Pilling on 08/02/2021 14:24:41 -------------------------------------------------------------------------------- Wound Assessment Details Patient Name: Date of Service: [name] [name], [name] V ID I. 08/02/2021 2:15 PM Medical Record Number: [id number] Patient Account Number: [id number] Date of Birth/Sex: Treating RN: Feb 10, 1926 (88 y.o. [name], [name] Primary Care Reilley Valentine: [name] Other Clinician: Referring Alexias Margerum: Treating Lamere Lightner/Extender: [name] in Treatment: 45 Wound Status Wound Number: 16 Primary Abrasion Etiology: Wound Location: Right, Lateral Lower Leg Wound Open Wounding Event: Trauma Status: Date Acquired: 07/12/2021 Comorbid Cataracts, Sleep Apnea, Arrhythmia, Congestive Heart Failure, Weeks Of Treatment: 2 History: Coronary Artery Disease, Hypertension, Myocardial Infarction, Clustered Wound: No End Stage Renal Disease, Gout, Dementia, Neuropathy Photos Wound Measurements Length: (cm) 1.5 Width: (cm) 2.5 Depth: (cm) 0.1 Area: (cm) 2.945 Volume: (cm) 0.295 % Reduction in Area: 75% % Reduction in Volume: 75% Epithelialization: Medium (34-66%) Tunneling: No Undermining: No Wound Description Classification: Full Thickness Without Exposed Support Structures Wound Margin: Distinct, outline attached Exudate Amount: Medium Exudate Type: Serosanguineous Exudate Color: red, brown Foul Odor After Cleansing: No Slough/Fibrino No Wound Bed Granulation Amount: Large (67-100%) Exposed Structure Granulation Quality: Red Fascia Exposed: No Necrotic Amount: None Present (0%) Fat Layer (Subcutaneous Tissue) Exposed: Yes Tendon Exposed: No Muscle Exposed: No Joint Exposed: No Bone Exposed: No Treatment Notes Wound #16 (Lower Leg) Wound Laterality: Right, Lateral Cleanser Soap and Water Discharge  Instruction: May shower and wash wound with dial  antibacterial soap and water prior to dressing change. Wound Cleanser Discharge Instruction: Cleanse the wound with wound cleanser prior to applying a clean dressing using gauze sponges, not tissue or cotton balls. Peri-Wound Care Topical Primary Dressing Xeroform Occlusive Gauze Dressing, 4x4 in Discharge Instruction: Apply to wound bed as instructed Secondary Dressing T Non-adherent Dressing, 2x3 in elfa Discharge Instruction: Apply over primary dressing as directed. Secured With Elastic Bandage 4 inch (ACE bandage) Discharge Instruction: Secure with ACE bandage as directed. Kerlix Roll Sterile, 4.5x3.1 (in/yd) Discharge Instruction: Secure with Kerlix as directed. Compression Wrap Compression Stockings Add-Ons Electronic Signature(s) Signed: 08/02/2021 6:08:09 PM By: Deon Pilling RN, BSN Entered By: Deon Pilling on 08/02/2021 14:23:16 -------------------------------------------------------------------------------- Wound Assessment Details Patient Name: Date of Service: [name] [name], [name] V ID I. 08/02/2021 2:15 PM Medical Record Number: [id number] Patient Account Number: [id number] Date of Birth/Sex: Treating RN: [birth year] (88 y.o. [name], [name] Primary Care Chananya Canizalez: [name] Other Clinician: Referring Desirey Keahey: Treating Aiya Keach/Extender: Eli Hose in Treatment: 45 Wound Status Wound Number: 3 Primary Venous Leg Ulcer Etiology: Wound Location: Left, Lateral Ankle Wound Open Wounding Event: Gradually Appeared Status: Date Acquired: 10/12/2020 Comorbid Cataracts, Sleep Apnea, Arrhythmia, Congestive Heart Failure, Weeks Of Treatment: 37 History: Coronary Artery Disease, Hypertension, Myocardial Infarction, Clustered Wound: No End Stage Renal Disease, Gout, Dementia, Neuropathy Photos Wound Measurements Length: (cm) 1.5 Width: (cm) 1.3 Depth: (cm) 0.1 Area: (cm) 1.532 Volume: (cm) 0.153 % Reduction in Area: -204.6% % Reduction in  Volume: -1.3% Epithelialization: Medium (34-66%) Tunneling: No Undermining: No Wound Description Classification: Full Thickness Without Exposed Support Structures Wound Margin: Epibole Exudate Amount: Medium Exudate Type: Serosanguineous Exudate Color: red, brown Foul Odor After Cleansing: No Slough/Fibrino Yes Wound Bed Granulation Amount: Large (67-100%) Exposed Structure Granulation Quality: Red Fascia Exposed: No Necrotic Amount: Small (1-33%) Fat Layer (Subcutaneous Tissue) Exposed: Yes Necrotic Quality: Adherent Slough Tendon Exposed: No Muscle Exposed: No Joint Exposed: No Bone Exposed: No Treatment Notes Wound #3 (Ankle) Wound Laterality: Left, Lateral Cleanser Soap and Water Discharge Instruction: May shower and wash wound with dial antibacterial soap and water prior to dressing change. Peri-Wound Care Topical Primary Dressing Promogran Prisma Matrix, 4.34 (sq in) (silver collagen) Discharge Instruction: Moisten collagen with hydrogel or KY jelly. Secondary Dressing T Non-adherent Dressing, 2x3 in elfa Discharge Instruction: Apply over primary dressing as directed. Woven Gauze Sponges 2x2 in Discharge Instruction: Apply over primary dressing as directed. Secured With Compression Wrap Compression Stockings Environmental education officer) Signed: 08/02/2021 6:08:09 PM By: Deon Pilling RN, BSN Entered By: Deon Pilling on 08/02/2021 14:23:51 -------------------------------------------------------------------------------- Wound Assessment Details Patient Name: Date of Service: [name] [name], [name] V ID I. 08/02/2021 2:15 PM Medical Record Number: [id number] Patient Account Number: [id number] Date of Birth/Sex: Treating RN: 09/18/1926 (88 y.o. [name], [name] Primary Care Reeya Bound: [name] Other Clinician: Referring Shamra Bradeen: Treating Devonia Farro/Extender: Eli Hose in Treatment: 45 Wound Status Wound Number: 6 Primary Arterial  Insufficiency Ulcer Etiology: Wound Location: Right, Lateral Ankle Wound Open Wounding Event: Gradually Appeared Status: Date Acquired: 11/22/2020 Comorbid Cataracts, Sleep Apnea, Arrhythmia, Congestive Heart Failure, Weeks Of Treatment: 32 History: Coronary Artery Disease, Hypertension, Myocardial Infarction, Clustered Wound: Yes End Stage Renal Disease, Gout, Dementia, Neuropathy Photos Wound Measurements Length: (cm) 1.4 Width: (cm) 0.7 Depth: (cm) 0.1 Clustered Quantity: 1 Area: (cm) 0.77 Volume: (cm) 0.077 % Reduction in Area: -180% % Reduction in Volume: 43.8% Epithelialization: Large (67-100%) Tunneling:  No Undermining: No Wound Description Classification: Full Thickness Without Exposed Support Structures Wound Margin: Distinct, outline attached Exudate Amount: Medium Exudate Type: Serosanguineous Exudate Color: red, brown Foul Odor After Cleansing: No Slough/Fibrino Yes Wound Bed Granulation Amount: Small (1-33%) Exposed Structure Granulation Quality: Pink Fascia Exposed: No Necrotic Amount: Large (67-100%) Fat Layer (Subcutaneous Tissue) Exposed: Yes Necrotic Quality: Eschar, Adherent Slough Tendon Exposed: No Muscle Exposed: No Joint Exposed: No Bone Exposed: No Treatment Notes Wound #6 (Ankle) Wound Laterality: Right, Lateral Cleanser Soap and Water Discharge Instruction: May shower and wash wound with dial antibacterial soap and water prior to dressing change. Peri-Wound Care Topical Primary Dressing Promogran Prisma Matrix, 4.34 (sq in) (silver collagen) Discharge Instruction: Moisten collagen with hydrogel or KY jelly. Secondary Dressing T Non-adherent Dressing, 2x3 in elfa Discharge Instruction: Apply over primary dressing as directed. Woven Gauze Sponges 2x2 in Discharge Instruction: Apply over primary dressing as directed. Secured With [employer], 4.5x3.1 (in/yd) Discharge Instruction: Secure with Kerlix as  directed. Compression Wrap Compression Stockings Add-Ons Electronic Signature(s) Signed: 08/02/2021 6:08:09 PM By: Deon Pilling RN, BSN Entered By: Deon Pilling on 08/02/2021 14:25:39 -------------------------------------------------------------------------------- Vitals Details Patient Name: Date of Service: [name] [name], [name] V ID I. 08/02/2021 2:15 PM Medical Record Number: [id number] Patient Account Number: [id number] Date of Birth/Sex: Treating RN: 1926/02/16 (88 y.o. [name], [name] Primary Care Oswell Say: [name] Other Clinician: Referring Edem Tiegs: Treating Gerda Yin/Extender: Eli Hose in Treatment: 45 Vital Signs Time Taken: 14:13 Temperature (F): 98.5 Height (in): 72 Pulse (bpm): 72 Weight (lbs): 223 Respiratory Rate (breaths/min): 20 Body Mass Index (BMI): 30.2 Blood Pressure (mmHg): 119/65 Reference Range: 80 - 120 mg / dl Electronic Signature(s) Signed: 08/02/2021 2:29:48 PM By: Sandre Kitty Entered By: Sandre Kitty on 08/02/2021 14:13:36

## 2021-08-02 NOTE — Telephone Encounter (Signed)
Tried to call but there was no answer and I could not leave a message.
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
## 2021-08-03 ENCOUNTER — Ambulatory Visit: Payer: Medicare Other

## 2021-08-07 ENCOUNTER — Ambulatory Visit (INDEPENDENT_AMBULATORY_CARE_PROVIDER_SITE_OTHER): Payer: Medicare Other | Admitting: Physical Medicine and Rehabilitation

## 2021-08-07 ENCOUNTER — Encounter: Payer: Self-pay | Admitting: Physical Medicine and Rehabilitation

## 2021-08-07 VITALS — BP 133/69 | HR 58

## 2021-08-07 DIAGNOSIS — M5416 Radiculopathy, lumbar region: Secondary | ICD-10-CM

## 2021-08-07 DIAGNOSIS — M48062 Spinal stenosis, lumbar region with neurogenic claudication: Secondary | ICD-10-CM

## 2021-08-07 DIAGNOSIS — M4726 Other spondylosis with radiculopathy, lumbar region: Secondary | ICD-10-CM | POA: Diagnosis not present

## 2021-08-07 DIAGNOSIS — Z8673 Personal history of transient ischemic attack (TIA), and cerebral infarction without residual deficits: Secondary | ICD-10-CM | POA: Diagnosis not present

## 2021-08-07 DIAGNOSIS — R269 Unspecified abnormalities of gait and mobility: Secondary | ICD-10-CM

## 2021-08-07 NOTE — Progress Notes (Unsigned)
Pt state lower back pain that travels down both legs. Pt state standing makes the pain worse. Pt state he take over the over the counter pain meds to help ease his pain. Pt has hx of inj 07/17/21 pt state it helped for two weeks with 75% relief.   Numeric Pain Rating Scale and Functional Assessment Average Pain 8 Pain Right Now 5 My pain is constant, sharp, burning, dull, and stabbing Pain is worse with: walking, bending, sitting, standing, some activites, and laying down Pain improves with: heat/ice, medication, and injections   In the last MONTH (on 0-10 scale) has pain interfered with the following?  1. General activity like being  able to carry out your everyday physical activities such as walking, climbing stairs, carrying groceries, or moving a chair?  Rating(5)  2. Relation with others like being able to carry out your usual social activities and roles such as  activities at home, at work and in your community. Rating(6)  3. Enjoyment of life such that you have  been bothered by emotional problems such as feeling anxious, depressed or irritable?  Rating(7)

## 2021-08-07 NOTE — Progress Notes (Unsigned)
[name] [name] - 88 y.o. male MRN [id number]  Date of birth: [birth year]  Office Visit Note: Visit Date: 08/07/2021 PCP: [name], MD Referred by: [name], MD  Subjective: Chief Complaint  Patient presents with   Lower Back - Pain   Right Leg - Pain   Left Leg - Pain   HPI: [name] [name] is a 88 y.o. male who comes in today for evaluation of chronic, worsening and severe bilateral lower back pain radiating down legs to feet. Pain ongoing for several months and is exacerbated by movement and activity. He describes pain as a sore, shooting and tingling sensation, currently rates as 5 out of 10. Some relief of pain with rest and use of medications. Patients recent CT imaging of lumbar spine exhibits old compression of L1 post kyphoplasty and moderate to severe spinal canal stenosis at the levels of T12-L1, L1-L2, L2-L3, L3-L4 and L4-L5. Patient is wheelchair bound and lives at home with his wife, his children also help care for him. Patient suffered from CVA on 05/25/2021, he now has right sided weakness and dysarthria. Patient recently had bilateral L4 transforaminal epidural steroid injection performed in our office on 07/17/2021 and reports greater than 75% relief of pain for approximately 2 weeks. Patient was previously treated by Dr. Suella Broad at [facility] where he also had lumbar epidural steroid injections with some relief of pain. Dr. Nelva Bush did refer patient to [place] initially as he was not able to transfer onto his injection table and requires assistance with positioning for injection. Patient is able to stand and pivot with assistance. Patient denies recent trauma or falls.    Review of Systems  Musculoskeletal:  Positive for back pain.  Neurological:  Positive for tingling, sensory change, speech change and weakness.       Right sided weakness and dysarthria from previous CVA.  All other systems reviewed and are negative.  Otherwise per HPI.  Assessment & Plan: Visit  Diagnoses:    ICD-10-CM   1. Lumbar radiculopathy  M54.16 Ambulatory referral to Physical Medicine Rehab    2. Spinal stenosis of lumbar region with neurogenic claudication  M48.062 Ambulatory referral to Physical Medicine Rehab    3. Other spondylosis with radiculopathy, lumbar region  M47.26 Ambulatory referral to Physical Medicine Rehab    4. Abnormal gait  R26.9 Ambulatory referral to Physical Medicine Rehab    5. History of CVA (cerebrovascular accident)  Z86.73 Ambulatory referral to Physical Medicine Rehab       Plan: Findings:  Chronic, worsening and severe bilateral lower back pain radiating down both legs to feet.  Patient continues to have severe pain despite good conservative therapy such as rest and use of medications.  Patient's clinical presentation and exam are consistent with neurogenic claudication as a result of spinal canal stenosis. Patient received greater than 75% relief of pain with recent lumbar epidural steroid injection. Next step is to repeat bilateral L4 transforaminal epidural steroid injection under fluoroscopic guidance. If patient continues to get significant and sustained pain relief with lumbar injections we did discuss possibility of repeating infrequently as needed. We will work to get patient in quickly for injection. No red flag symptoms noted upon exam today.     Meds & Orders: No orders of the defined types were placed in this encounter.   Orders Placed This Encounter  Procedures   Ambulatory referral to Physical Medicine Rehab    Follow-up: Return for Bilateral L4 transforaminal epidural steroid injection.  Procedures: No procedures performed      Clinical History: CT LUMBAR SPINE WITHOUT CONTRAST   TECHNIQUE: Multidetector CT imaging of the lumbar spine was performed without intravenous contrast administration. Multiplanar CT image reconstructions were also generated.   RADIATION DOSE REDUCTION: This exam was performed according to  the departmental dose-optimization program which includes automated exposure control, adjustment of the mA and/or kV according to patient size and/or use of iterative reconstruction technique.   COMPARISON:  08/17/2020   FINDINGS: Segmentation: 5 lumbar type vertebral bodies.   Alignment: Mild retropulsion of L1 fragments, unchanged since prior study. Otherwise normal alignments.   Vertebrae: Compression of the L1 vertebra with some retropulsion of fracture fragments. Previous kyphoplasty cement. Appearance is unchanged since the prior study. No new compression deformities. Diffuse bone demineralization. No focal bone lesion or bone destruction. Visualized sacrum appears intact.   Paraspinal and other soft tissues: No abnormal paraspinal soft tissue mass or infiltration. Small bilateral pleural effusions are seen. Calcification of the aorta. Aortic aneurysm is demonstrated with maximal AP diameter of 4.3 cm. Similar appearance to previous study.   Disc levels: Degenerative disc space narrowing most prominent at L1-2 and L5-S1. Degenerative disc disease at L1-2. Endplate osteophyte formation throughout. There is evidence of moderate to severe central stenosis at T12-L1, L1-2, L2-3, L3-4, L4-5 levels. Degenerative changes in the facet joints.   IMPRESSION: 1. Old compression of L1 post kyphoplasty. Retropulsion of fracture fragments. No change in appearance since prior study. 2. No new acute fractures identified. 3. Diffuse multilevel degenerative changes. 4. 4.3 cm diameter abdominal aortic aneurysm is unchanged. Bilateral pleural effusions.     Electronically Signed   By: Lucienne Capers M.D.   On: 03/14/2021 23:26   He reports that he quit smoking about 58 years ago. His smoking use included cigarettes. He has a 7.50 pack-year smoking history. He has never been exposed to tobacco smoke. He has never used smokeless tobacco.  Recent Labs    10/22/20 1856 05/25/21 1124   HGBA1C  --  5.8*  LABURIC 5.9  --     Objective:  VS:  HT:    WT:   BMI:     BP:133/69  HR: (!) 58bpm  TEMP: ( )  RESP:  Physical Exam Vitals and nursing note reviewed.  HENT:     Head: Normocephalic and atraumatic.     Right Ear: External ear normal.     Left Ear: External ear normal.     Nose: Nose normal.     Mouth/Throat:     Mouth: Mucous membranes are moist.  Eyes:     Extraocular Movements: Extraocular movements intact.  Cardiovascular:     Rate and Rhythm: Normal rate.     Pulses: Normal pulses.  Pulmonary:     Effort: Pulmonary effort is normal.  Abdominal:     General: Abdomen is flat. There is no distension.  Musculoskeletal:        General: Tenderness present.     Cervical back: Normal range of motion.     Comments: Pt requires assistance when moving from sitting to standing position. Good lumbar range of motion. 4/5 strength to right lower extermity, no pain upon palpation of greater trochanters. Sensation intact bilaterally. Wheelchair bound, can transfer with assistance.  Skin:    General: Skin is warm and dry.     Capillary Refill: Capillary refill takes less than 2 seconds.  Neurological:     Mental Status: He is alert and oriented to person,  place, and time.     Motor: Weakness present.     Gait: Gait abnormal.  Psychiatric:        Mood and Affect: Mood normal.        Behavior: Behavior normal.     Ortho Exam  Imaging: No results found.  Past Medical/Family/Surgical/Social History: Medications & Allergies reviewed per EMR, new medications updated. Patient Active Problem List   Diagnosis Date Noted   Left middle cerebral artery stroke ([place]) 05/31/2021   Acute CVA (cerebrovascular accident) ([place]) 05/25/2021   DNR (do not resuscitate) 05/25/2021   Osteoporosis 05/05/2021   Gouty arthritis 02/13/2021   Morbid obesity ([employer]) 02/13/2021   Abdominal aortic aneurysm without rupture ([place]) 11/22/2020   Acute renal failure syndrome ([place])  11/22/2020   Low back pain 11/22/2020   Skin tear of right upper arm without complication 12/75/1700   Pressure injury of skin 10/23/2020   Urinary tract infection with hematuria 10/22/2020   Encephalopathy 10/22/2020   AKI (acute kidney injury) ([place])    Overweight (BMI 25.0-29.9) 08/19/2020   Vertebral fracture, osteoporotic ([place]) 08/18/2020   T8 vertebral fracture ([place]) 08/17/2020   Thrombocytopenia ([place]) 08/17/2020   CKD (chronic kidney disease) stage 3, GFR 30-59 ml/min (HCC) 08/17/2020   Elevated troponin 08/17/2020   Pain due to onychomycosis of toenails of both feet 07/27/2020   Callus of heel 07/27/2020   Bifascicular block 11/11/2019   Acute on chronic diastolic heart failure ([place]) 11/10/2019   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   Severe aortic stenosis 09/29/2019   Aortic valve disorder 17/49/4496   Systolic heart failure ([place]) 03/11/2019   Thrombophilia ([place]) 02/26/2019   Muscle pain 02/11/2019   Weakness 02/06/2019   Polymyalgia rheumatica ([place]) 02/06/2019   Shoulder joint pain 02/02/2019   Anemia 01/26/2019   Malignant melanoma of skin ([place]) 01/26/2019   Pain in right leg 01/26/2019   DDD (degenerative disc disease), cervical 05/28/2018   Encounter for general adult medical examination without abnormal findings 11/19/2017   Carotid artery occlusion 07/03/2017   Hearing loss 07/03/2017   Fall 06/19/2017   Injury of upper extremity 06/19/2017   Neck pain 06/19/2017   Headache 06/19/2017   Visual disturbance 06/19/2017   Ganglion of hand 05/08/2017   Dilatation of aorta ([place]) 02/14/2017   Plantar fascial fibromatosis 02/14/2017   Cardiac murmur, unspecified 02/27/2016   Benign neoplasm of colon 08/24/2015   Dementia ([place]) 08/24/2015   Influenza due to unidentified influenza virus with other respiratory manifestations 03/15/2015   Epistaxis 12/01/2014   OSA (obstructive sleep apnea) 11/18/2014   Gastro-esophageal reflux disease with  esophagitis 10/29/2014   Angina pectoris ([place]) 05/04/2014   Abnormal feces 07/31/2013   Other specified disorders of gingiva and edentulous alveolar ridge 07/31/2013   Spontaneous ecchymosis 10/10/2012   Thoracic ascending aortic aneurysm ([name]) 09/29/2012   Microscopic hematuria 04/11/2012   Proteinuria 04/11/2012   S/P laparoscopic cholecystectomy 04/09/2012   S/P CABG x 3 03/06/2012   S/P Maze operation for atrial fibrillation 03/06/2012   Paroxysmal atrial fibrillation ([place]) 02/29/2012   Bitten or stung by nonvenomous insect and other nonvenomous arthropods, initial encounter 12/03/2011   Depression screening 10/17/2011   CAD (coronary artery disease), native coronary artery    Hyperlipidemia    Hypertension    Chronic venous insufficiency    Gout    Benign prostatic hyperplasia    Post-traumatic stress disorder 03/13/2011   Pain in left leg 12/08/2010   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   Fatigue 08/08/2010  Retention of urine 04/18/2010   Constipation 07/05/2009   Kidney stone 07/05/2009   ED (erectile dysfunction) of organic origin 12/14/2008   Impaired fasting glucose 12/14/2008   Vitamin B deficiency 12/14/2008   Past Medical History:  Diagnosis Date   AAA (abdominal aortic aneurysm) (HCC)    Arthritis    Ascending aorta dilatation ([place]) 03/13/2012   Fusiform dilatation of the ascending thoracic aorta discovered during surgery, max diameter 4.2-4.3 cm   BPH (benign prostatic hypertrophy)    Chronic systolic heart failure ([place]) 03/03/2012   Chronic venous insufficiency    GERD (gastroesophageal reflux disease)    Glaucoma    Gout    History of kidney stones    Hyperlipidemia    Hypertension    Obesity (BMI 30-39.9)    Paroxysmal atrial fibrillation (HCC) 02/29/2012   Recurrent paroxysmal, new-onset    PMR (polymyalgia rheumatica) (HCC)    Polymyalgia rheumatica (HCC) 02/06/2019   Pre-diabetes    S/P CABG x 3 03/06/2012   LIMA to LAD, SVG to OM, SVG to  RCA, EVH via right thigh   S/P Maze operation for atrial fibrillation 03/06/2012   Complete bilateral lesions set using bipolar radiofrequency and cryothermy ablation with clipping of LA appendage   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   s/p TAVR with a 26 mm Edwards via the left subclavian by Drs Buena Irish and Bartle   Severe aortic stenosis 09/29/2019   Sleep apnea    USES CPAP NIGHTLY   Family History  Problem Relation Age of Onset   Dementia Mother    Stroke Brother    Past Surgical History:  Procedure Laterality Date   ABDOMINAL AORTOGRAM W/LOWER EXTREMITY N/A 10/26/2020   Procedure: ABDOMINAL AORTOGRAM W/LOWER EXTREMITY;  Surgeon: Broadus John, MD;  Location: [facility];  Service: Cardiovascular;  Laterality: N/A;   CARDIAC CATHETERIZATION     CATARACT EXTRACTION, BILATERAL     with lens implants   CHOLECYSTECTOMY N/A 03/24/2012   Procedure: LAPAROSCOPIC CHOLECYSTECTOMY WITH INTRAOPERATIVE CHOLANGIOGRAM;  Surgeon: Zenovia Jarred, MD;  Location: [facility];  Service: General;  Laterality: N/A;   CORONARY ARTERY BYPASS GRAFT N/A 03/06/2012   Procedure: CORONARY ARTERY BYPASS GRAFTING (CABG);  Surgeon: Rexene Alberts, MD;  Location: [facility] [facility];  Service: Open Heart Surgery;  Laterality: N/A;   ENDOVEIN HARVEST OF GREATER SAPHENOUS VEIN Right 03/06/2012   Procedure: ENDOVEIN HARVEST OF GREATER SAPHENOUS VEIN;  Surgeon: Rexene Alberts, MD;  Location: [facility];  Service: Open Heart Surgery;  Laterality: Right;   INTRAOPERATIVE TRANSESOPHAGEAL ECHOCARDIOGRAM N/A 03/06/2012   Procedure: INTRAOPERATIVE TRANSESOPHAGEAL ECHOCARDIOGRAM;  Surgeon: Rexene Alberts, MD;  Location: [facility];  Service: Open Heart Surgery;  Laterality: N/A;   IR KYPHO LUMBAR INC FX REDUCE BONE BX UNI/BIL CANNULATION INC/IMAGING  02/23/2020   LEFT HEART CATH Right 03/02/2012   Procedure: LEFT HEART CATH;  Surgeon: Sherren Mocha, MD;  [facility];  Service: Cardiovascular;  Laterality: Right;    MAZE N/A 03/06/2012   Procedure: MAZE;  Surgeon: Rexene Alberts, MD;  Location: [facility];  Service: Open Heart Surgery;  Laterality: N/A;   PERIPHERAL VASCULAR INTERVENTION Right 10/26/2020   Procedure: PERIPHERAL VASCULAR INTERVENTION;  Surgeon: Broadus John, MD;  Location: [facility];  Service: Cardiovascular;  Laterality: Right;   PROSTATECTOMY     partial   RIGHT/LEFT HEART CATH AND CORONARY/GRAFT ANGIOGRAPHY N/A 09/29/2019   Procedure: RIGHT/LEFT HEART CATH AND CORONARY/GRAFT ANGIOGRAPHY;  Surgeon: Einar Gip,  Ulice Dash, MD;  Location: [facility];  Service: Cardiovascular;  Laterality: N/A;   TEE WITHOUT CARDIOVERSION N/A 11/10/2019   Procedure: TRANSESOPHAGEAL ECHOCARDIOGRAM (TEE);  Surgeon: Burnell Blanks, MD;  Location: [facility];  Service: Open Heart Surgery;  Laterality: N/A;   TRANSCATHETER AORTIC VALVE REPLACEMENT, TRANSFEMORAL  11/10/2019   Social History   Occupational History   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

## 2021-08-16 ENCOUNTER — Encounter (HOSPITAL_BASED_OUTPATIENT_CLINIC_OR_DEPARTMENT_OTHER): Payer: Medicare Other | Admitting: Physician Assistant

## 2021-08-16 ENCOUNTER — Ambulatory Visit (INDEPENDENT_AMBULATORY_CARE_PROVIDER_SITE_OTHER): Payer: Medicare Other | Admitting: Podiatry

## 2021-08-16 ENCOUNTER — Encounter: Payer: Self-pay | Admitting: Podiatry

## 2021-08-16 DIAGNOSIS — M79675 Pain in left toe(s): Secondary | ICD-10-CM

## 2021-08-16 DIAGNOSIS — L97822 Non-pressure chronic ulcer of other part of left lower leg with fat layer exposed: Secondary | ICD-10-CM | POA: Diagnosis not present

## 2021-08-16 DIAGNOSIS — M79674 Pain in right toe(s): Secondary | ICD-10-CM

## 2021-08-16 DIAGNOSIS — B351 Tinea unguium: Secondary | ICD-10-CM

## 2021-08-16 DIAGNOSIS — L97112 Non-pressure chronic ulcer of right thigh with fat layer exposed: Secondary | ICD-10-CM | POA: Diagnosis not present

## 2021-08-16 DIAGNOSIS — L97412 Non-pressure chronic ulcer of right heel and midfoot with fat layer exposed: Secondary | ICD-10-CM | POA: Diagnosis not present

## 2021-08-16 DIAGNOSIS — D696 Thrombocytopenia, unspecified: Secondary | ICD-10-CM

## 2021-08-16 DIAGNOSIS — L97812 Non-pressure chronic ulcer of other part of right lower leg with fat layer exposed: Secondary | ICD-10-CM | POA: Diagnosis not present

## 2021-08-16 DIAGNOSIS — N1831 Chronic kidney disease, stage 3a: Secondary | ICD-10-CM

## 2021-08-16 DIAGNOSIS — N179 Acute kidney failure, unspecified: Secondary | ICD-10-CM

## 2021-08-16 DIAGNOSIS — L89613 Pressure ulcer of right heel, stage 3: Secondary | ICD-10-CM | POA: Diagnosis not present

## 2021-08-16 NOTE — Progress Notes (Signed)
This patient returns to my office for at risk foot care.  This patient requires this care by a professional since this patient will be at risk due to having thrombocytopenia, CKD and chronic venous stasis  B/L.  Patient is taking eliquis.  Patient is going to the wound center for his ulcers on his leg. This patient is unable to cut nails himself since the patient cannot reach his nails.These nails are painful walking and wearing shoes.  He presents to the office today with his daughter. This patient presents for at risk foot care today.  General Appearance  Alert, conversant and in no acute stress.  Vascular  Dorsalis pedis and posterior tibial  pulses are not palpable due to swelling  and bandaging. bilaterally.  Capillary return is within normal limits  bilaterally. Temperature is diminished  bilaterally.  Neurologic  Senn-Weinstein monofilament wire test within normal limits  bilaterally. Muscle power within normal limits bilaterally.  Nails Thick disfigured discolored nails with subungual debris  from hallux to fifth toes bilaterally. No evidence of bacterial infection or drainage bilaterally.  Orthopedic  No limitations of motion  feet .  No crepitus or effusions noted.  No bony pathology or digital deformities noted.  Skin  normotropic skin with no porokeratosis noted bilaterally.  No signs of infections or ulcers noted.     Onychomycosis  Pain in right toes  Pain in left toes    Consent was obtained for treatment procedures.   Mechanical debridement of nails 1-5  bilaterally performed with a nail nipper.  Filed with dremel without incident.    Return office visit  4 months                 Told patient to return for periodic foot care and evaluation due to potential at risk complications.       Gardiner Barefoot DPM

## 2021-08-17 ENCOUNTER — Encounter: Payer: Medicare Other | Attending: Registered Nurse | Admitting: Physical Medicine & Rehabilitation

## 2021-08-17 ENCOUNTER — Encounter: Payer: Self-pay | Admitting: Physical Medicine & Rehabilitation

## 2021-08-17 VITALS — BP 145/69 | Ht 72.0 in

## 2021-08-17 DIAGNOSIS — N390 Urinary tract infection, site not specified: Secondary | ICD-10-CM | POA: Diagnosis not present

## 2021-08-17 DIAGNOSIS — R82998 Other abnormal findings in urine: Secondary | ICD-10-CM | POA: Diagnosis not present

## 2021-08-17 DIAGNOSIS — I63512 Cerebral infarction due to unspecified occlusion or stenosis of left middle cerebral artery: Secondary | ICD-10-CM | POA: Insufficient documentation

## 2021-08-17 NOTE — Progress Notes (Signed)
Subjective:  Chief Complaint: Stroke-like symptoms   HPI: [name] [name] is a 88 y.o. male with medical history significant of AAA; CAD s/p CABG; chronic systolic CHF; afib s/p Maze, on Xarelto; HTN; HLD; PMR (currently on steroids); pre-diabetes; OSA on CPAP; PAD with lymphedema; and AS s/p TAVR presenting with stroke-like symptoms. He has been having a lot of back pain and has been disabled in an electric wheelchair.  They had a meeting with bad news.  Shortly thereafter, he was trembling on one side with slurred speech.  His episode lasted less than a minute but the speech difficulty persisted.  Still having some dysarthria.  He has been anemic/thrombocytopenic recently with transfusion x 2 units twice.  He saw oncology at [facility] and his iron was good but they having been giving Epogen.  He was due for his second injection today (of 3 total).  He saw the urologist this week too and was diagnosed with a UTI and is on antibiotics (has only taken two days and 3 doses of cefdinir), urine was cloudy with gross hematuria and back pain.  He has had a compression fracture and this is why he doesn't walk.  He also has wounds and had Mohs surgery Monday on his chin, sees wound care every other week.  Patient ID: [name] [name], male    DOB: 02/03/1926, 88 y.o.   MRN: [id number]  HPI  The patient has returned to home post discharge from inpatient rehab.  He started with home health PT OT and speech.  He has had no significant swallowing problems but continues to have problems with word finding.  He was told by his home health speech therapist that they focus mainly on swallowing and not his aphasia. Patient still having walking difficulties this has been compounded by calcaneal wound. He is going to wound care. He states he does not have any weightbearing restrictions. In addition he continues to have problems with right upper extremity coordination. His daughter helps care for him.  They live in the [facility]. Pain Inventory Average Pain 5 Pain Right Now 8 My pain is constant, sharp, burning, stabbing, and aching  LOCATION OF PAIN  leg, back  BOWEL Number of stools per week: 5 Oral laxative use Yes  Type of laxative miralax  BLADDER Pads In and out cath, frequency no Able to self cath No  Bladder incontinence Yes  Frequent urination No  Leakage with coughing No  Difficulty starting stream No  Incomplete bladder emptying Yes    Mobility ability to climb steps?  no do you drive?  no use a wheelchair needs help with transfers Do you have any goals in this area?  yes  Function retired I need assistance with the following:  dressing, bathing, toileting, meal prep, household duties, and shopping Do you have any goals in this area?  yes  Neuro/Psych bladder control problems weakness numbness trouble walking spasms dizziness confusion depression loss of taste or smell  Prior Studies Any changes since last visit?  yes x-rays  Physicians involved in your care Any changes since last visit?  yes Dr. 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     Very active physically without significant limitations.    Exercises regularly   Social Determinants of Health   Financial Resource Strain: Not on file  Food Insecurity: Not on file  Transportation Needs: Not on file  Physical Activity: Not on file  Stress: Not on file  Social Connections: Not on file   Past Surgical History:  Procedure Laterality Date   ABDOMINAL AORTOGRAM W/LOWER EXTREMITY N/A 10/26/2020   Procedure: ABDOMINAL AORTOGRAM W/LOWER EXTREMITY;  Surgeon: Broadus John, MD;  Location: [place] [facility];  Service: Cardiovascular;  Laterality: N/A;   CARDIAC CATHETERIZATION     CATARACT EXTRACTION, BILATERAL     with lens implants   CHOLECYSTECTOMY N/A 03/24/2012   Procedure: LAPAROSCOPIC CHOLECYSTECTOMY WITH INTRAOPERATIVE CHOLANGIOGRAM;  Surgeon: Zenovia Jarred, MD;  Location: [place];  Service: General;  Laterality: N/A;   CORONARY ARTERY BYPASS GRAFT N/A 03/06/2012   Procedure: CORONARY ARTERY BYPASS GRAFTING (CABG);  Surgeon: Rexene Alberts, MD;  Location: [place];  Service: Open Heart Surgery;  Laterality: N/A;   ENDOVEIN HARVEST OF GREATER SAPHENOUS VEIN Right 03/06/2012   Procedure: ENDOVEIN HARVEST OF GREATER SAPHENOUS VEIN;  Surgeon: Rexene Alberts, MD;  Location: [place];  Service: Open Heart Surgery;  Laterality: Right;   INTRAOPERATIVE TRANSESOPHAGEAL ECHOCARDIOGRAM N/A 03/06/2012   Procedure: INTRAOPERATIVE TRANSESOPHAGEAL ECHOCARDIOGRAM;  Surgeon: Rexene Alberts, MD;  Location: [place];  Service: Open Heart Surgery;  Laterality: N/A;   IR KYPHO LUMBAR INC FX REDUCE BONE BX UNI/BIL CANNULATION INC/IMAGING  02/23/2020   LEFT HEART CATH Right 03/02/2012   Procedure: LEFT HEART CATH;  Surgeon: Sherren Mocha, MD;  Location: [facility];  Service: Cardiovascular;  Laterality: Right;   MAZE N/A 03/06/2012   Procedure: MAZE;  Surgeon: Rexene Alberts, MD;  Location: [place];  Service: Open  Heart Surgery;  Laterality: N/A;   PERIPHERAL VASCULAR INTERVENTION Right 10/26/2020   Procedure: PERIPHERAL VASCULAR INTERVENTION;  Surgeon: Broadus John, MD;  Location: [place] [facility];  Service: Cardiovascular;  Laterality: Right;   PROSTATECTOMY     partial   RIGHT/LEFT HEART CATH AND CORONARY/GRAFT ANGIOGRAPHY N/A 09/29/2019   Procedure: RIGHT/LEFT HEART CATH AND CORONARY/GRAFT ANGIOGRAPHY;  Surgeon: Adrian Prows, MD;  Location: [place] [facility];  Service: Cardiovascular;  Laterality: N/A;   TEE WITHOUT CARDIOVERSION N/A 11/10/2019   Procedure: TRANSESOPHAGEAL ECHOCARDIOGRAM (TEE);  Surgeon: Burnell Blanks, MD;  Location: [place];  Service: Open Heart Surgery;  Laterality: N/A;   TRANSCATHETER AORTIC VALVE REPLACEMENT, TRANSFEMORAL  11/10/2019   Past Medical History:  Diagnosis Date   AAA (abdominal aortic aneurysm) (HCC)    Arthritis  Ascending aorta dilatation (HCC) 03/13/2012   Fusiform dilatation of the ascending thoracic aorta discovered during surgery, max diameter 4.2-4.3 cm   BPH (benign prostatic hypertrophy)    Chronic systolic heart failure ([place]) 03/03/2012   Chronic venous insufficiency    GERD (gastroesophageal reflux disease)    Glaucoma    Gout    History of kidney stones    Hyperlipidemia    Hypertension    Obesity (BMI 30-39.9)    Paroxysmal atrial fibrillation (HCC) 02/29/2012   Recurrent paroxysmal, new-onset    PMR (polymyalgia rheumatica) (HCC)    Polymyalgia rheumatica ([place]) 02/06/2019   Pre-diabetes    S/P CABG x 3 03/06/2012   LIMA to LAD, SVG to OM, SVG to RCA, EVH via right thigh   S/P Maze operation for atrial fibrillation 03/06/2012   Complete bilateral lesions set using bipolar radiofrequency and cryothermy ablation with clipping of LA appendage   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   s/p TAVR with a 26 mm Edwards via the left subclavian by Drs Buena Irish and Bartle   Severe aortic stenosis 09/29/2019    Sleep apnea    USES CPAP NIGHTLY   BP (!) 114/58   Ht 6' (1.829 m) Comment: unknown, in wheelchair  SpO2 94%   BMI 29.42 kg/m   Opioid Risk Score:   Fall Risk Score:  `1  Depression screen [facility] 2/9     07/03/2021   10:23 AM 05/05/2012    2:53 PM 04/16/2012    2:06 PM 07/13/2011    3:34 PM 04/18/2011    8:56 AM  Depression screen PHQ 2/9  Decreased Interest 0 0 0 0 0  Down, Depressed, Hopeless 0 0 0 0 1  PHQ - 2 Score 0 0 0 0 1  Altered sleeping 0      Tired, decreased energy 1      Change in appetite 0      Feeling bad or failure about yourself  1      Trouble concentrating 2      Moving slowly or fidgety/restless 3      Suicidal thoughts 0      PHQ-9 Score 7      Difficult doing work/chores Very difficult        Review of Systems  Cardiovascular:  Positive for leg swelling.  Gastrointestinal:  Positive for abdominal pain and constipation.  Endocrine: Negative for polyphagia.  Genitourinary:        Incontience  Neurological:  Positive for weakness and numbness.       Spasms  Hematological:  Bruises/bleeds easily.  Psychiatric/Behavioral:         Depression  All other systems reviewed and are negative.      Objective:   Physical Exam Vitals and nursing note reviewed.  Constitutional:      Appearance: He is normal weight.  HENT:     Head: Normocephalic and atraumatic.  Eyes:     Extraocular Movements: Extraocular movements intact.     Conjunctiva/sclera: Conjunctivae normal.     Pupils: Pupils are equal, round, and reactive to light.  Musculoskeletal:        General: No swelling or tenderness.     Right lower leg: Edema present.     Left lower leg: Edema present.  Neurological:     General: No focal deficit present.     Mental Status: He is alert and oriented to person, place, and time.  Psychiatric:  Mood and Affect: Mood normal.        Behavior: Behavior normal.   Motor strength is 4/5 in the right deltoid, bicep, tricep, grip, hip flexor, knee  extensor, ankle dorsiflexor There is decreased finger thumb opposition right upper extremity as well as dysdiadochokinesis with rapid alternating supination pronation of the right upper extremity Speech with a anomic aphasia noted.  He has more difficulty with less frequently utilized words.        Assessment & Plan:   1.  Left MCA distribution infarct with residual mild aphasia mainly expressive/anomic right upper extremity motor control issues as well as gait disorder.  He would benefit from outpatient PT OT speech.  We will send order to neuro rehab.

## 2021-08-17 NOTE — Progress Notes (Signed)
KHAIRI, GARMAN I. ([id number]) Visit Report for 08/16/2021 Fall Risk Assessment Details Patient Name: Date of Service: [name] [name] ID I. 08/16/2021 2:15 PM Medical Record Number: [id number] Patient Account Number: [id number] Date of Birth/Sex: Treating RN: [birth year] (88 y.o. [name] [name] Primary Care [name] [name]: [name] Other Clinician: Referring Mutasim Tuckey: Treating Sederick Jacobsen/Extender: Eli Hose in Treatment: 69 Fall Risk Assessment Items Have you had 2 or more falls in the last 12 monthso 0 Yes Have you had any fall that resulted in injury in the last 12 monthso 0 Yes FALLS RISK SCREEN History of falling - immediate or within 3 months 25 Yes Secondary diagnosis (Do you have 2 or more medical diagnoseso) 15 Yes Ambulatory aid None/bed rest/wheelchair/nurse 0 Yes Crutches/cane/walker 0 No Furniture 0 No Intravenous therapy Access/Saline/Heparin Lock 0 No Gait/Transferring Normal/ bed rest/ wheelchair 0 No Weak (short steps with or without shuffle, stooped but able to lift head while walking, may seek 10 Yes support from furniture) Impaired (short steps with shuffle, may have difficulty arising from chair, head down, impaired 0 No balance) Mental Status Oriented to own ability 0 Yes Electronic Signature(s) Signed: 08/16/2021 5:34:07 PM By: Deon Pilling RN, BSN Entered By: Deon Pilling on 08/16/2021 14:55:43

## 2021-08-17 NOTE — Progress Notes (Signed)
[name] [name], LEDERMAN I. ([id number]) Visit Report for 08/16/2021 Arrival Information Details Patient Name: Date of Service: [name] [name] ID I. 08/16/2021 2:15 PM Medical Record Number: [id number] Patient Account Number: [id number] Date of Birth/Sex: Treating RN: Feb 24, 1926 (88 y.o. [name] [name] Primary Care Denicia Pagliarulo: [name] [name] Other Clinician: Referring [name] [name]: Treating [name] [name]/Extender: [name] [name] in Treatment: 43 Visit Information History Since Last Visit Added or deleted any medications: No Patient Arrived: Wheel Chair Any new allergies or adverse reactions: No Arrival Time: 14:20 Had a fall or experienced change in No Accompanied By: wife activities of daily living that may affect Transfer Assistance: Manual risk of falls: Patient Identification Verified: Yes Signs or symptoms of abuse/neglect since last visito No Secondary Verification Process Completed: Yes Hospitalized since last visit: No Patient Requires Transmission-Based No Implantable device outside of the clinic excluding No Precautions: cellular tissue based products placed in the center Patient Has Alerts: Yes since last visit: Patient Alerts: Patient on Blood Thinner Has Dressing in Place as Prescribed: Yes Has Footwear/Offloading in Place as Prescribed: Yes Right: Other:bunny Pain Present Now: Yes Electronic Signature(s) Signed: 08/16/2021 5:34:07 PM By: [name] Pilling RN, BSN Entered By: [name] [name] on 08/16/2021 14:25:30 -------------------------------------------------------------------------------- Encounter Discharge Information Details Patient Name: Date of Service: [name] [name], [name] 08/16/2021 2:15 PM Medical Record Number: [id number] Patient Account Number: [id number] Date of Birth/Sex: Treating RN: 02/23/1926 (88 y.o. [name] [name] Primary Care [name] [name]: [name] [name] Other Clinician: Referring [name] [name]: Treating [name] [name]/Extender: [name] [name] in Treatment: 67 Encounter Discharge Information Items Post Procedure Vitals Discharge Condition: Stable Temperature (F): 98.6 Ambulatory Status: Wheelchair Pulse (bpm): 66 Discharge Destination: Home Respiratory Rate (breaths/min): 20 Transportation: Private Auto Blood Pressure (mmHg): 138/66 Accompanied By: wife Schedule Follow-up Appointment: Yes Clinical Summary of Care: Electronic Signature(s) Signed: 08/16/2021 5:34:07 PM By: [name] Pilling RN, BSN Entered By: [name] [name] on 08/16/2021 15:05:55 -------------------------------------------------------------------------------- Lower Extremity Assessment Details Patient Name: Date of Service: [name], [name] 08/16/2021 2:15 PM Medical Record Number: [id number] Patient Account Number: [id number] Date of Birth/Sex: Treating RN: 10/09/1926 (88 y.o. [name] [name] Primary Care [name]: [name] [name] Other Clinician: Referring [name] [name]: Treating [name] [name]/Extender: [name] [name] in Treatment: 47 Edema Assessment Assessed: [Left: Yes] [name] [name]: Yes] Edema: [Left: Yes] [Right: Yes] Calf Left: Right: Point of Measurement: 34 cm From Medial Instep 37 cm 40 cm Ankle Left: Right: Point of Measurement: 11 cm From Medial Instep 28 cm 30 cm Vascular Assessment Pulses: Dorsalis Pedis Palpable: [Left:Yes] [Right:Yes] Electronic Signature(s) Signed: 08/16/2021 5:34:07 PM By: [name] Pilling RN, BSN Entered By: [name] [name] on 08/16/2021 14:30:30 -------------------------------------------------------------------------------- Multi-Disciplinary Care Plan Details Patient Name: Date of Service: [name] [name], [name] 08/16/2021 2:15 PM Medical Record Number: [id number] Patient Account Number: [id number] Date of Birth/Sex: Treating RN: April 07, 1926 (88 y.o. [name] [name] Primary Care [name] [name]: [name] [name] Other Clinician: Referring [name] [name]: Treating [name] [name]/Extender: [name] [name] in Treatment: 37 Active Inactive Abuse / Safety / Falls / Self Care Management Nursing Diagnoses: Potential for falls Goals: Patient will remain injury free related to falls Date Initiated: 03/22/2021 Target Resolution Date: 09/01/2021 Goal Status: Active Interventions: Assess: immobility, friction, shearing, incontinence upon admission and as needed Assess impairment of mobility on admission and as needed per policy Assess self care needs on admission and as needed Provide education on fall prevention Notes: Nutrition Nursing Diagnoses: Potential for  alteratiion in Nutrition/Potential for imbalanced nutrition Goals: Patient/caregiver agrees to and verbalizes understanding of need to obtain nutritional consultation Date Initiated: 03/22/2021 T arget Resolution Date: 09/01/2021 Goal Status: Active Interventions: Provide education on nutrition Treatment Activities: Education provided on Nutrition : 08/02/2021 Giving encouragement to exercise : 03/22/2021 Notes: Electronic Signature(s) Signed: 08/16/2021 5:34:07 PM By: [name] Pilling RN, BSN Entered By: [name] [name] on 08/16/2021 14:50:45 -------------------------------------------------------------------------------- Pain Assessment Details Patient Name: Date of Service: [name] [name], [name] 08/16/2021 2:15 PM Medical Record Number: [id number] Patient Account Number: [id number] Date of Birth/Sex: Treating RN: [birth year] (88 y.o. [name] [name] Primary Care Nolin Grell: [name] [name] Other Clinician: Referring Osmin Welz: Treating [name] [name]/Extender: [name] [name] in Treatment: 64 Active Problems Location of Pain Severity and Description of Pain Patient Has Paino Yes Site Locations Pain Location: Pain in Ulcers Rate the pain. Current Pain Level: 2 Pain Management and Medication Current Pain Management: Medication: No Cold Application: No Rest: No Massage: No Activity: No T.E.N.S.: No Heat  Application: No Leg drop or elevation: No Is the Current Pain Management Adequate: Adequate How does your wound impact your activities of daily livingo Sleep: No Bathing: No Appetite: No Relationship With Others: No Bladder Continence: No Emotions: No Bowel Continence: No Work: No Toileting: No Drive: No Dressing: No Hobbies: No Notes per patient pain with lymphedema pumps to right heel wound. Marieclaire Bettenhausen made aware. Electronic Signature(s) Signed: 08/16/2021 5:34:07 PM By: [name] Pilling RN, BSN Entered By: [name] [name] on 08/16/2021 14:26:33 -------------------------------------------------------------------------------- Patient/Caregiver Education Details Patient Name: Date of Service: [name] [name], Shaune Pascal ID I. 8/16/2023andnbsp2:15 PM Medical Record Number: [id number] Patient Account Number: [id number] Date of Birth/Gender: Treating RN: 05/20/1926 (88 y.o. [name] [name] Primary Care Physician: [name] [name] Other Clinician: Referring Physician: Treating Physician/Extender: [name] [name] in Treatment: 79 Education Assessment Education Provided To: Patient Education Topics Provided Safety: Handouts: Medication Safety Methods: Explain/Verbal Responses: Reinforcements needed Electronic Signature(s) Signed: 08/16/2021 5:34:07 PM By: [name] Pilling RN, BSN Entered By: [name] [name] on 08/16/2021 14:51:02 -------------------------------------------------------------------------------- Wound Assessment Details Patient Name: Date of Service: [name] [name], [name] 08/16/2021 2:15 PM Medical Record Number: [id number] Patient Account Number: [id number] Date of Birth/Sex: Treating RN: 1926/11/21 (88 y.o. [name], [name] Primary Care Rushie Brazel: [name] [name] Other Clinician: Referring Gannon Heinzman: Treating Lluvia Gwynne/Extender: [name] [name] in Treatment: 70 Wound Status Wound Number: 14 Primary Open Surgical Wound Etiology: Wound Location:  Left, Anterior Lower Leg Wound Open Wounding Event: Surgical Injury Status: Date Acquired: 05/26/2021 Comorbid Cataracts, Sleep Apnea, Arrhythmia, Congestive Heart Failure, [name] Of Treatment: 6 History: Coronary Artery Disease, Hypertension, Myocardial Infarction, Clustered Wound: No End Stage Renal Disease, Gout, Dementia, Neuropathy Photos Wound Measurements Length: (cm) 2.3 Width: (cm) 1.9 Depth: (cm) 0.2 Area: (cm) 3.432 Volume: (cm) 0.686 % Reduction in Area: 12.6% % Reduction in Volume: 12.6% Epithelialization: Small (1-33%) Tunneling: No Undermining: No Wound Description Classification: Full Thickness Without Exposed Support Structures Wound Margin: Epibole Exudate Amount: Medium Exudate Type: Serosanguineous Exudate Color: red, brown Foul Odor After Cleansing: No Slough/Fibrino Yes Wound Bed Granulation Amount: Large (67-100%) Exposed Structure Granulation Quality: Red, Hyper-granulation, Friable Fascia Exposed: No Necrotic Amount: Small (1-33%) Fat Layer (Subcutaneous Tissue) Exposed: Yes Necrotic Quality: Adherent Slough Tendon Exposed: No Muscle Exposed: No Joint Exposed: No Bone Exposed: No Treatment Notes Wound #14 (Lower Leg) Wound Laterality: Left, Anterior Cleanser Soap and Water Discharge Instruction: May shower and wash wound with dial antibacterial  soap and water prior to dressing change. Peri-Wound Care Skin Prep Discharge Instruction: Use skin prep as directed Topical Primary Dressing Promogran Prisma Matrix, 4.34 (sq in) (silver collagen) Discharge Instruction: Moisten collagen with hydrogel or KY jelly. Secondary Dressing Zetuvit Plus Silicone Border Dressing 4x4 (in/in) Discharge Instruction: Apply silicone border over primary dressing as directed. Secured With Compression Wrap Compression Stockings Environmental education officer) Signed: 08/16/2021 5:34:07 PM By: [name] Pilling RN, BSN Entered By: [name] [name] on 08/16/2021  14:40:05 -------------------------------------------------------------------------------- Wound Assessment Details Patient Name: Date of Service: [name] [name], [name] 08/16/2021 2:15 PM Medical Record Number: [id number] Patient Account Number: [id number] Date of Birth/Sex: Treating RN: Dec 13, 1926 (88 y.o. [name], [name] Primary Care Demontrae Gilbert: [name] [name] Other Clinician: Referring Clotine Heiner: Treating Jaquelinne Glendening/Extender: [name] [name] in Treatment: 71 Wound Status Wound Number: 15 Primary Pressure Ulcer Etiology: Wound Location: Right Calcaneus Secondary Abrasion Wounding Event: Trauma Etiology: Date Acquired: 07/12/2021 Wound Open [name] Of Treatment: 4 Status: Clustered Wound: No Comorbid Cataracts, Sleep Apnea, Arrhythmia, Congestive Heart Failure, History: Coronary Artery Disease, Hypertension, Myocardial Infarction, End Stage Renal Disease, Gout, Dementia, Neuropathy Photos Wound Measurements Length: (cm) 4 Width: (cm) 4.5 Depth: (cm) 0.1 Area: (cm) 14.137 Volume: (cm) 1.414 % Reduction in Area: -30.9% % Reduction in Volume: -30.9% Epithelialization: Small (1-33%) Tunneling: No Undermining: No Wound Description Classification: Category/Stage III Wound Margin: Distinct, outline attached Exudate Amount: Medium Exudate Type: Serosanguineous Exudate Color: red, brown Foul Odor After Cleansing: No Slough/Fibrino Yes Wound Bed Granulation Amount: Large (67-100%) Exposed Structure Granulation Quality: Red, Pink Fascia Exposed: No Necrotic Amount: Small (1-33%) Fat Layer (Subcutaneous Tissue) Exposed: Yes Necrotic Quality: Adherent Slough Tendon Exposed: No Muscle Exposed: No Joint Exposed: No Bone Exposed: No Treatment Notes Wound #15 (Calcaneus) Wound Laterality: Right Cleanser Soap and Water Discharge Instruction: May shower and wash wound with dial antibacterial soap and water prior to dressing change. Wound Cleanser Discharge  Instruction: Cleanse the wound with wound cleanser prior to applying a clean dressing using gauze sponges, not tissue or cotton balls. Peri-Wound Care Topical Primary Dressing Iodosorb Gel 10 (gm) Tube Discharge Instruction: Apply to wound bed as instructed Secondary Dressing ALLEVYN Heel 4 1/2in x 5 1/2in / 10.5cm x 13.5cm Discharge Instruction: Apply over primary dressing as directed. T Non-adherent Dressing, 2x3 in elfa Discharge Instruction: Apply over primary dressing as directed. Woven Gauze Sponge, Non-Sterile 4x4 in Discharge Instruction: ***PLEASE SEND BULK.***Apply over primary dressing as directed. Secured With [employer], 4.5x3.1 (in/yd) Discharge Instruction: Secure with Kerlix as directed. Compression Wrap Compression Stockings Add-Ons Electronic Signature(s) Signed: 08/16/2021 5:34:07 PM By: [name] Pilling RN, BSN Entered By: [name] [name] on 08/16/2021 14:42:57 -------------------------------------------------------------------------------- Wound Assessment Details Patient Name: Date of Service: [name] [name], [name] 08/16/2021 2:15 PM Medical Record Number: [id number] Patient Account Number: [id number] Date of Birth/Sex: Treating RN: 1926/09/29 (88 y.o. [name], [name] Primary Care Kitrina Maurin: [name] [name] Other Clinician: Referring Zavian Slowey: Treating Bree Heinzelman/Extender: [name] [name] in Treatment: 28 Wound Status Wound Number: 16 Primary Abrasion Etiology: Wound Location: Right, Lateral Lower Leg Wound Healed - Epithelialized Wounding Event: Trauma Status: Date Acquired: 07/12/2021 Comorbid Cataracts, Sleep Apnea, Arrhythmia, Congestive Heart Failure, [name] Of Treatment: 4 History: Coronary Artery Disease, Hypertension, Myocardial Infarction, Clustered Wound: No End Stage Renal Disease, Gout, Dementia, Neuropathy Photos Wound Measurements Length: (cm) Width: (cm) Depth: (cm) Area: (cm) Volume: (cm) 0 % Reduction in  Area: 100% 0 % Reduction in Volume: 100% 0 Epithelialization:  Large (67-100%) 0 Tunneling: No 0 Undermining: No Wound Description Classification: Full Thickness Without Exposed Support Structures Wound Margin: Distinct, outline attached Exudate Amount: None Present Foul Odor After Cleansing: No Slough/Fibrino No Wound Bed Granulation Amount: None Present (0%) Exposed Structure Necrotic Amount: None Present (0%) Fascia Exposed: No Fat Layer (Subcutaneous Tissue) Exposed: No Tendon Exposed: No Muscle Exposed: No Joint Exposed: No Bone Exposed: No Electronic Signature(s) Signed: 08/16/2021 5:34:07 PM By: [name] Pilling RN, BSN Entered By: [name] [name] on 08/16/2021 14:38:45 -------------------------------------------------------------------------------- Wound Assessment Details Patient Name: Date of Service: [name] [name], [name] 08/16/2021 2:15 PM Medical Record Number: [id number] Patient Account Number: [id number] Date of Birth/Sex: Treating RN: [birth year] (88 y.o. [name], [name] Primary Care Deeksha Cotrell: [name] [name] Other Clinician: Referring Daniyla Pfahler: Treating Philippa Vessey/Extender: [name] [name] in Treatment: 9 Wound Status Wound Number: 17 Primary Abrasion Etiology: Wound Location: Left, Lateral Lower Leg Wound Open Wounding Event: Skin Tear/Laceration Status: Date Acquired: 08/11/2021 Comorbid Cataracts, Sleep Apnea, Arrhythmia, Congestive Heart Failure, [name] Of Treatment: 0 History: Coronary Artery Disease, Hypertension, Myocardial Infarction, Clustered Wound: Yes End Stage Renal Disease, Gout, Dementia, Neuropathy Photos Wound Measurements Length: (cm) 1.8 Width: (cm) 1.5 Depth: (cm) 0.1 Clustered Quantity: 2 Area: (cm) 2.121 Volume: (cm) 0.212 % Reduction in Area: 0% % Reduction in Volume: 0% Epithelialization: Small (1-33%) Tunneling: No Undermining: No Wound Description Classification: Full Thickness Without Exposed Support  Structures Wound Margin: Distinct, outline attached Exudate Amount: Medium Exudate Type: Serosanguineous Exudate Color: red, brown Foul Odor After Cleansing: No Slough/Fibrino No Wound Bed Granulation Amount: Large (67-100%) Exposed Structure Granulation Quality: Red Fascia Exposed: No Necrotic Amount: None Present (0%) Fat Layer (Subcutaneous Tissue) Exposed: Yes Tendon Exposed: No Muscle Exposed: No Joint Exposed: No Bone Exposed: No Treatment Notes Wound #17 (Lower Leg) Wound Laterality: Left, Lateral Cleanser Soap and Water Discharge Instruction: May shower and wash wound with dial antibacterial soap and water prior to dressing change. Peri-Wound Care Skin Prep Discharge Instruction: Use skin prep as directed Topical Primary Dressing Xeroform Occlusive Gauze Dressing, 4x4 in Discharge Instruction: Apply to wound bed as instructed Secondary Dressing Zetuvit Plus Silicone Border Dressing 4x4 (in/in) Discharge Instruction: Apply silicone border over primary dressing as directed. Secured With Compression Wrap Compression Stockings Environmental education officer) Signed: 08/16/2021 5:34:07 PM By: [name] Pilling RN, BSN Entered By: [name] [name] on 08/16/2021 14:41:37 -------------------------------------------------------------------------------- Wound Assessment Details Patient Name: Date of Service: [name] [name], [name] 08/16/2021 2:15 PM Medical Record Number: [id number] Patient Account Number: [id number] Date of Birth/Sex: Treating RN: 12/07/1926 (88 y.o. [name], [name] Primary Care Bonnetta Allbee: [name] [name] Other Clinician: Referring Moe Graca: Treating Joliet Mallozzi/Extender: [name] [name] in Treatment: 78 Wound Status Wound Number: 18 Primary Skin T ear Etiology: Wound Location: Right Upper Leg Wound Open Wounding Event: Trauma Status: Date Acquired: 08/14/2021 Comorbid Cataracts, Sleep Apnea, Arrhythmia, Congestive Heart Failure, [name] Of  Treatment: 0 History: Coronary Artery Disease, Hypertension, Myocardial Infarction, Clustered Wound: No End Stage Renal Disease, Gout, Dementia, Neuropathy Photos Wound Measurements Length: (cm) 2 Width: (cm) 2.3 Depth: (cm) 0.1 Area: (cm) 3.613 Volume: (cm) 0.361 % Reduction in Area: 0% % Reduction in Volume: 0% Epithelialization: Small (1-33%) Tunneling: No Undermining: No Wound Description Classification: Full Thickness Without Exposed Support Structures Wound Margin: Distinct, outline attached Exudate Amount: Medium Exudate Type: Serosanguineous Exudate Color: red, brown Foul Odor After Cleansing: No Slough/Fibrino No Wound Bed Granulation Amount: Large (67-100%) Exposed Structure Granulation Quality: Red Fascia Exposed:  No Necrotic Amount: None Present (0%) Fat Layer (Subcutaneous Tissue) Exposed: Yes Tendon Exposed: No Muscle Exposed: No Joint Exposed: No Bone Exposed: No Treatment Notes Wound #18 (Upper Leg) Wound Laterality: Right Cleanser Soap and Water Discharge Instruction: May shower and wash wound with dial antibacterial soap and water prior to dressing change. Peri-Wound Care Skin Prep Discharge Instruction: Use skin prep as directed Topical Primary Dressing Xeroform Occlusive Gauze Dressing, 4x4 in Discharge Instruction: Apply to wound bed as instructed Secondary Dressing Zetuvit Plus Silicone Border Dressing 4x4 (in/in) Discharge Instruction: Apply silicone border over primary dressing as directed. Secured With Compression Wrap Compression Stockings Environmental education officer) Signed: 08/16/2021 5:34:07 PM By: [name] Pilling RN, BSN Entered By: [name] [name] on 08/16/2021 14:45:13 -------------------------------------------------------------------------------- Wound Assessment Details Patient Name: Date of Service: [name] [name], [name] 08/16/2021 2:15 PM Medical Record Number: [id number] Patient Account Number: [id number] Date of  Birth/Sex: Treating RN: 1926-01-07 (88 y.o. [name], [name] Primary Care Jayln Branscom: [name] [name] Other Clinician: Referring Bienvenido Proehl: Treating Orval Dortch/Extender: [name] [name] in Treatment: 48 Wound Status Wound Number: 3 Primary Venous Leg Ulcer Etiology: Wound Location: Left, Lateral Ankle Wound Open Wounding Event: Gradually Appeared Status: Date Acquired: 10/12/2020 Comorbid Cataracts, Sleep Apnea, Arrhythmia, Congestive Heart Failure, [name] Of Treatment: 39 History: Coronary Artery Disease, Hypertension, Myocardial Infarction, Clustered Wound: No End Stage Renal Disease, Gout, Dementia, Neuropathy Photos Wound Measurements Length: (cm) 1.4 Width: (cm) 0.8 Depth: (cm) 0.1 Area: (cm) 0.88 Volume: (cm) 0.088 % Reduction in Area: -75% % Reduction in Volume: 41.7% Epithelialization: Large (67-100%) Tunneling: No Undermining: No Wound Description Classification: Full Thickness Without Exposed Support Structures Wound Margin: Epibole Exudate Amount: Medium Exudate Type: Serosanguineous Exudate Color: red, brown Foul Odor After Cleansing: No Slough/Fibrino No Wound Bed Granulation Amount: Large (67-100%) Exposed Structure Granulation Quality: Red Fascia Exposed: No Necrotic Amount: None Present (0%) Fat Layer (Subcutaneous Tissue) Exposed: Yes Tendon Exposed: No Muscle Exposed: No Joint Exposed: No Bone Exposed: No Treatment Notes Wound #3 (Ankle) Wound Laterality: Left, Lateral Cleanser Soap and Water Discharge Instruction: May shower and wash wound with dial antibacterial soap and water prior to dressing change. Peri-Wound Care Skin Prep Discharge Instruction: Use skin prep as directed Topical Primary Dressing Promogran Prisma Matrix, 4.34 (sq in) (silver collagen) Discharge Instruction: Moisten collagen with hydrogel or KY jelly. Secondary Dressing Zetuvit Plus Silicone Border Dressing 4x4 (in/in) Discharge Instruction: Apply  silicone border over primary dressing as directed. Secured With Compression Wrap Compression Stockings Environmental education officer) Signed: 08/16/2021 5:34:07 PM By: [name] Pilling RN, BSN Entered By: [name] [name] on 08/16/2021 14:41:00 -------------------------------------------------------------------------------- Wound Assessment Details Patient Name: Date of Service: [name] [name], [name] 08/16/2021 2:15 PM Medical Record Number: [id number] Patient Account Number: [id number] Date of Birth/Sex: Treating RN: August 22, 1926 (88 y.o. [name], [name] Primary Care Dyamon Sosinski: [name] [name] Other Clinician: Referring Ibrohim Simmers: Treating Lyllie Cobbins/Extender: [name] [name] in Treatment: 80 Wound Status Wound Number: 6 Primary Arterial Insufficiency Ulcer Etiology: Wound Location: Right, Lateral Ankle Wound Open Wounding Event: Gradually Appeared Status: Date Acquired: 11/22/2020 Comorbid Cataracts, Sleep Apnea, Arrhythmia, Congestive Heart Failure, [name] Of Treatment: 34 History: Coronary Artery Disease, Hypertension, Myocardial Infarction, Clustered Wound: Yes End Stage Renal Disease, Gout, Dementia, Neuropathy Photos Wound Measurements Length: (cm) 1.2 Width: (cm) 0.1 Depth: (cm) 0.1 Clustered Quantity: 1 Area: (cm) 0.094 Volume: (cm) 0.009 % Reduction in Area: 65.8% % Reduction in Volume: 93.4% Epithelialization: Large (67-100%) Tunneling: No Undermining: No Wound Description  Classification: Full Thickness Without Exposed Support Structures Wound Margin: Distinct, outline attached Exudate Amount: Medium Exudate Type: Serosanguineous Exudate Color: red, brown Wound Bed Granulation Amount: Large (67-100%) Granulation Quality: Pink Necrotic Amount: None Present (0%) Foul Odor After Cleansing: No Slough/Fibrino No Exposed Structure Fascia Exposed: No Fat Layer (Subcutaneous Tissue) Exposed: Yes Tendon Exposed: No Muscle Exposed: No Joint  Exposed: No Bone Exposed: No Treatment Notes Wound #6 (Ankle) Wound Laterality: Right, Lateral Cleanser Soap and Water Discharge Instruction: May shower and wash wound with dial antibacterial soap and water prior to dressing change. Peri-Wound Care Skin Prep Discharge Instruction: Use skin prep as directed Topical Primary Dressing Promogran Prisma Matrix, 4.34 (sq in) (silver collagen) Discharge Instruction: Moisten collagen with hydrogel or KY jelly. Secondary Dressing Zetuvit Plus Silicone Border Dressing 4x4 (in/in) Discharge Instruction: Apply silicone border over primary dressing as directed. Secured With Compression Wrap Compression Stockings Environmental education officer) Signed: 08/16/2021 5:34:07 PM By: [name] Pilling RN, BSN Entered By: [name] [name] on 08/16/2021 14:37:50 -------------------------------------------------------------------------------- Vitals Details Patient Name: Date of Service: [name] [name], [name] 08/16/2021 2:15 PM Medical Record Number: [id number] Patient Account Number: [id number] Date of Birth/Sex: Treating RN: [birth year] (88 y.o. [name] [name] Primary Care Travez Stancil: [name] [name] Other Clinician: Referring Judea Riches: Treating Sofi Bryars/Extender: [name] [name] in Treatment: 16 Vital Signs Time Taken: 14:25 Temperature (F): 98.2 Height (in): 72 Pulse (bpm): 66 Weight (lbs): 223 Respiratory Rate (breaths/min): 20 Body Mass Index (BMI): 30.2 Blood Pressure (mmHg): 138/66 Reference Range: 80 - 120 mg / dl Electronic Signature(s) Signed: 08/16/2021 5:34:07 PM By: [name] Pilling RN, BSN Entered By: [name] [name] on 08/16/2021 14:25:50

## 2021-08-17 NOTE — Progress Notes (Addendum)
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 Discharge Instructions: Apply over primary dressing as directed. Secondary Dressing: Woven Gauze Sponge, Non-Sterile 4x4 in (DME) (Generic) 3 x Per Week/30 Days Discharge Instructions: ***PLEASE SEND BULK.***Apply over primary dressing as directed. Secured With: [employer], 4.5x3.1 (in/yd) (DME) (Generic) 3 x Per Week/30 Days Discharge Instructions: Secure with Kerlix as directed. WOUND #17: - Lower Leg Wound Laterality: Left, Lateral Cleanser: Soap and Water 3 x Per Week/30 Days Discharge Instructions: May shower and wash wound with dial antibacterial soap and water prior to dressing change. Peri-Wound Care: Skin Prep (DME) (Generic) 3 x Per Week/30 Days Discharge Instructions: Use skin prep as directed Prim Dressing: Xeroform Occlusive Gauze Dressing, 4x4 in (DME) (Generic) 3 x Per Week/30 Days ary Discharge Instructions: Apply to wound bed as instructed Secondary Dressing: Zetuvit Plus Silicone Border Dressing 4x4 (in/in) (DME) (Generic) 3 x Per Week/30 Days Discharge Instructions: Apply silicone border over primary dressing as directed. WOUND #18: - Upper Leg Wound Laterality: Right Cleanser: Soap and Water 3 x Per Week/30 Days Discharge Instructions: May shower and wash wound with dial antibacterial soap and water prior to dressing change. Peri-Wound Care: Skin Prep (DME) (Generic) 3 x Per Week/30 Days Discharge Instructions: Use skin prep as directed Prim Dressing: Xeroform Occlusive Gauze Dressing, 4x4 in (DME) (Generic) 3 x Per Week/30 Days ary Discharge Instructions: Apply to wound bed as instructed Secondary Dressing: Zetuvit Plus Silicone Border Dressing 4x4 (in/in) (DME) (Generic) 3 x Per Week/30 Days Discharge Instructions: Apply silicone border over primary dressing as directed. WOUND #3: - Ankle Wound Laterality: Left, Lateral Cleanser: Soap and Water 3 x Per Week/30 Days Discharge Instructions: May shower and wash wound with dial antibacterial  soap and water prior to dressing change. Peri-Wound Care: Skin Prep (DME) (Generic) 3 x Per Week/30 Days Discharge Instructions: Use skin prep as directed Prim Dressing: Promogran Prisma Matrix, 4.34 (sq in) (silver collagen) (DME) (Dispense As Written) 3 x Per Week/30 Days ary Discharge Instructions: Moisten collagen with hydrogel or KY jelly. Secondary Dressing: Zetuvit Plus Silicone Border Dressing 4x4 (in/in) (DME) (Generic) 3 x Per Week/30 Days Discharge Instructions: Apply silicone border over primary dressing as directed. WOUND #6: - Ankle Wound Laterality: Right, Lateral Cleanser: Soap and Water 3 x Per Week/30 Days Discharge Instructions: May shower and wash wound with dial antibacterial soap and water prior to dressing change. Peri-Wound Care: Skin Prep (DME) (Generic) 3 x Per Week/30 Days Discharge Instructions: Use skin prep as directed Prim Dressing: Promogran Prisma Matrix, 4.34 (sq in) (silver collagen) (DME) (Dispense As Written) 3 x Per Week/30 Days ary Discharge Instructions: Moisten collagen with hydrogel or KY jelly. Secondary Dressing: Zetuvit Plus Silicone Border Dressing 4x4 (in/in) (DME) (Generic) 3 x Per Week/30 Days Discharge Instructions: Apply  silicone border over primary dressing as directed. 1. I would recommend currently that we going continue with the wound care measures as before and the patient is in agreement with plan this includes the use of the Iodosorb for the right heel this is doing quite well. We are going to use collagen on the remainder of the wounds on lower extremities except for the 2 new skin tears on the right and left were using some Xeroform. 2. We will going continue with the bordered foam dressings which seem to be doing quite well. We will see patient back for reevaluation in 1 week here in the clinic. If anything worsens or changes patient will contact our office for additional recommendations. Electronic Signature(s) Signed: 08/16/2021  3:12:01 PM By: [name] PA-C Entered By: [name] on 08/16/2021 15:12:01 -------------------------------------------------------------------------------- SuperBill Details Patient Name: Date of Service: [name] [name], [name] V ID I. 08/16/2021 Medical Record Number: [id number] Patient Account Number: [id number] Date of Birth/Sex: Treating RN: 1926/01/16 (88 y.o. [name], [name] Primary Care Provider: [name] Other Clinician: Referring Provider: Treating Provider/Extender: Eli Hose in Treatment: 47 Diagnosis Coding ICD-10 Codes Code Description [phone] Pressure ulcer of right heel, stage 3 I73.89 Other specified peripheral vascular diseases I87.2 Venous insufficiency (chronic) (peripheral) L97.312 Non-pressure chronic ulcer of right ankle with fat layer exposed L97.822 Non-pressure chronic ulcer of other part of left lower leg with fat layer exposed L89.153 Pressure ulcer of sacral region, stage 3 S71.111D Laceration without foreign body, right thigh, subsequent encounter T81.31XA Disruption of external operation (surgical) wound, not elsewhere classified, initial encounter N40.1 Benign prostatic hyperplasia with lower urinary tract symptoms N18.30 Chronic kidney disease, stage 3 unspecified I10 Essential (primary) hypertension Facility Procedures CPT4 Code: 86761950 Description: 93267 - DEBRIDE W/O ANES NON SELECT Modifier: Quantity: 1 Physician Procedures : CPT4 Code Description Modifier [id number] - WC PHYS LEVEL 4 - EST PT ICD-10 Diagnosis Description L89.613 Pressure ulcer of right heel, stage 3 I73.89 Other specified peripheral vascular diseases I87.2 Venous insufficiency (chronic) (peripheral)  L97.312 Non-pressure chronic ulcer of right ankle with fat layer exposed Quantity: 1 Electronic Signature(s) Signed: 08/16/2021 3:14:33 PM By: [name] PA-C Entered By: [name] on 08/16/2021 15:14:32

## 2021-08-18 DIAGNOSIS — L814 Other melanin hyperpigmentation: Secondary | ICD-10-CM | POA: Diagnosis not present

## 2021-08-18 DIAGNOSIS — D225 Melanocytic nevi of trunk: Secondary | ICD-10-CM | POA: Diagnosis not present

## 2021-08-18 DIAGNOSIS — L821 Other seborrheic keratosis: Secondary | ICD-10-CM | POA: Diagnosis not present

## 2021-08-18 DIAGNOSIS — B001 Herpesviral vesicular dermatitis: Secondary | ICD-10-CM | POA: Diagnosis not present

## 2021-08-22 ENCOUNTER — Other Ambulatory Visit: Payer: Self-pay

## 2021-08-22 ENCOUNTER — Inpatient Hospital Stay: Payer: Medicare Other | Attending: Oncology

## 2021-08-22 ENCOUNTER — Inpatient Hospital Stay: Payer: Medicare Other

## 2021-08-22 ENCOUNTER — Inpatient Hospital Stay: Payer: Medicare Other | Admitting: Oncology

## 2021-08-22 VITALS — BP 108/52 | HR 57 | Temp 97.7°F | Resp 16

## 2021-08-22 DIAGNOSIS — N189 Chronic kidney disease, unspecified: Secondary | ICD-10-CM | POA: Insufficient documentation

## 2021-08-22 DIAGNOSIS — D696 Thrombocytopenia, unspecified: Secondary | ICD-10-CM | POA: Insufficient documentation

## 2021-08-22 DIAGNOSIS — D649 Anemia, unspecified: Secondary | ICD-10-CM

## 2021-08-22 DIAGNOSIS — Z79899 Other long term (current) drug therapy: Secondary | ICD-10-CM | POA: Insufficient documentation

## 2021-08-22 DIAGNOSIS — D631 Anemia in chronic kidney disease: Secondary | ICD-10-CM | POA: Insufficient documentation

## 2021-08-22 LAB — CBC WITH DIFFERENTIAL (CANCER CENTER ONLY)
Abs Immature Granulocytes: 0.01 10*3/uL (ref 0.00–0.07)
Basophils Absolute: 0.1 10*3/uL (ref 0.0–0.1)
Basophils Relative: 1 %
Eosinophils Absolute: 0.2 10*3/uL (ref 0.0–0.5)
Eosinophils Relative: 4 %
HCT: 26.9 % — ABNORMAL LOW (ref 39.0–52.0)
Hemoglobin: 8.6 g/dL — ABNORMAL LOW (ref 13.0–17.0)
Immature Granulocytes: 0 %
Lymphocytes Relative: 22 %
Lymphs Abs: 1 10*3/uL (ref 0.7–4.0)
MCH: 29 pg (ref 26.0–34.0)
MCHC: 32 g/dL (ref 30.0–36.0)
MCV: 90.6 fL (ref 80.0–100.0)
Monocytes Absolute: 0.6 10*3/uL (ref 0.1–1.0)
Monocytes Relative: 12 %
Neutro Abs: 2.7 10*3/uL (ref 1.7–7.7)
Neutrophils Relative %: 61 %
Platelet Count: 126 10*3/uL — ABNORMAL LOW (ref 150–400)
RBC: 2.97 MIL/uL — ABNORMAL LOW (ref 4.22–5.81)
RDW: 16.9 % — ABNORMAL HIGH (ref 11.5–15.5)
WBC Count: 4.5 10*3/uL (ref 4.0–10.5)
nRBC: 0 % (ref 0.0–0.2)

## 2021-08-22 LAB — SAMPLE TO BLOOD BANK

## 2021-08-22 MED ORDER — EPOETIN ALFA-EPBX 20000 UNIT/ML IJ SOLN
20000.0000 [IU] | Freq: Once | INTRAMUSCULAR | Status: AC
  Administered 2021-08-22: 20000 [IU] via SUBCUTANEOUS
  Filled 2021-08-22: qty 1

## 2021-08-22 NOTE — Patient Instructions (Signed)

## 2021-08-23 ENCOUNTER — Encounter: Payer: Self-pay | Admitting: Physical Medicine and Rehabilitation

## 2021-08-23 ENCOUNTER — Ambulatory Visit (INDEPENDENT_AMBULATORY_CARE_PROVIDER_SITE_OTHER): Payer: Medicare Other | Admitting: Physical Medicine and Rehabilitation

## 2021-08-23 ENCOUNTER — Ambulatory Visit: Payer: Self-pay

## 2021-08-23 VITALS — BP 145/77 | HR 73

## 2021-08-23 DIAGNOSIS — M5416 Radiculopathy, lumbar region: Secondary | ICD-10-CM | POA: Diagnosis not present

## 2021-08-23 MED ORDER — DEXAMETHASONE SODIUM PHOSPHATE 10 MG/ML IJ SOLN
10.0000 mg | Freq: Once | INTRAMUSCULAR | Status: AC
  Administered 2021-08-23: 10 mg

## 2021-08-23 NOTE — Patient Instructions (Signed)

## 2021-08-23 NOTE — Progress Notes (Signed)
Pt state lower back pain that travels down both legs. Pt state standing makes the pain worse. Pt state he take over the over the counter pain meds to help ease his pain  Numeric Pain Rating Scale and Functional Assessment Average Pain 7   In the last MONTH (on 0-10 scale) has pain interfered with the following?  1. General activity like being  able to carry out your everyday physical activities such as walking, climbing stairs, carrying groceries, or moving a chair?  Rating(10)   +Driver, +BT, -Dye Allergies.

## 2021-08-29 ENCOUNTER — Ambulatory Visit: Payer: Medicare Other

## 2021-08-29 ENCOUNTER — Other Ambulatory Visit: Payer: Self-pay

## 2021-08-29 ENCOUNTER — Ambulatory Visit: Payer: Medicare Other | Attending: Physical Medicine & Rehabilitation | Admitting: Occupational Therapy

## 2021-08-29 DIAGNOSIS — R2681 Unsteadiness on feet: Secondary | ICD-10-CM | POA: Insufficient documentation

## 2021-08-29 DIAGNOSIS — R2689 Other abnormalities of gait and mobility: Secondary | ICD-10-CM | POA: Insufficient documentation

## 2021-08-29 DIAGNOSIS — R262 Difficulty in walking, not elsewhere classified: Secondary | ICD-10-CM

## 2021-08-29 DIAGNOSIS — R41840 Attention and concentration deficit: Secondary | ICD-10-CM | POA: Insufficient documentation

## 2021-08-29 DIAGNOSIS — R41841 Cognitive communication deficit: Secondary | ICD-10-CM | POA: Diagnosis not present

## 2021-08-29 DIAGNOSIS — R4701 Aphasia: Secondary | ICD-10-CM

## 2021-08-29 DIAGNOSIS — R208 Other disturbances of skin sensation: Secondary | ICD-10-CM | POA: Diagnosis not present

## 2021-08-29 DIAGNOSIS — I69351 Hemiplegia and hemiparesis following cerebral infarction affecting right dominant side: Secondary | ICD-10-CM | POA: Insufficient documentation

## 2021-08-29 DIAGNOSIS — M6281 Muscle weakness (generalized): Secondary | ICD-10-CM | POA: Insufficient documentation

## 2021-08-29 NOTE — Therapy (Signed)
OUTPATIENT SPEECH LANGUAGE PATHOLOGY APHASIA EVALUATION   Patient Name: [name] [name] MRN: [id number] DOB:12/13/1926, 88 y.o., male Today's Date: 08/29/2021  PCP: [name], MD REFERRING PROVIDER: Alysia Penna, MD   End of Session - 08/29/21 1520     Visit Number 1    Number of Visits 17    Date for SLP Re-Evaluation 10/27/21    SLP Start Time 60    SLP Stop Time  1445    SLP Time Calculation (min) 43 min    Activity Tolerance Patient tolerated treatment well             Past Medical History:  Diagnosis Date   AAA (abdominal aortic aneurysm) ([place])    Arthritis    Ascending aorta dilatation ([place]) 03/13/2012   Fusiform dilatation of the ascending thoracic aorta discovered during surgery, max diameter 4.2-4.3 cm   BPH (benign prostatic hypertrophy)    Chronic systolic heart failure ([place]) 03/03/2012   Chronic venous insufficiency    GERD (gastroesophageal reflux disease)    Glaucoma    Gout    History of kidney stones    Hyperlipidemia    Hypertension    Obesity (BMI 30-39.9)    Paroxysmal atrial fibrillation ([place]) 02/29/2012   Recurrent paroxysmal, new-onset    PMR (polymyalgia rheumatica) (HCC)    Polymyalgia rheumatica ([place]) 02/06/2019   Pre-diabetes    S/P CABG x 3 03/06/2012   LIMA to LAD, SVG to OM, SVG to RCA, EVH via right thigh   S/P Maze operation for atrial fibrillation 03/06/2012   Complete bilateral lesions set using bipolar radiofrequency and cryothermy ablation with clipping of LA appendage   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   s/p TAVR with a 26 mm Edwards via the left subclavian by Drs Buena Irish and Bartle   Severe aortic stenosis 09/29/2019   Sleep apnea    USES CPAP NIGHTLY   Past Surgical History:  Procedure Laterality Date   ABDOMINAL AORTOGRAM W/LOWER EXTREMITY N/A 10/26/2020   Procedure: ABDOMINAL AORTOGRAM W/LOWER EXTREMITY;  Surgeon: Broadus John, MD;  Location: [facility];  Service:  Cardiovascular;  Laterality: N/A;   CARDIAC CATHETERIZATION     CATARACT EXTRACTION, BILATERAL     with lens implants   CHOLECYSTECTOMY N/A 03/24/2012   Procedure: LAPAROSCOPIC CHOLECYSTECTOMY WITH INTRAOPERATIVE CHOLANGIOGRAM;  Surgeon: Zenovia Jarred, MD;  Location: [facility];  Service: General;  Laterality: N/A;   CORONARY ARTERY BYPASS GRAFT N/A 03/06/2012   Procedure: CORONARY ARTERY BYPASS GRAFTING (CABG);  Surgeon: Rexene Alberts, MD;  Location: [facility];  Service: Open Heart Surgery;  Laterality: N/A;   ENDOVEIN HARVEST OF GREATER SAPHENOUS VEIN Right 03/06/2012   Procedure: ENDOVEIN HARVEST OF GREATER SAPHENOUS VEIN;  Surgeon: Rexene Alberts, MD;  Location: [facility];  Service: Open Heart Surgery;  Laterality: Right;   INTRAOPERATIVE TRANSESOPHAGEAL ECHOCARDIOGRAM N/A 03/06/2012   Procedure: INTRAOPERATIVE TRANSESOPHAGEAL ECHOCARDIOGRAM;  Surgeon: Rexene Alberts, MD;  Location: [facility];  Service: Open Heart Surgery;  Laterality: N/A;   IR KYPHO LUMBAR INC FX REDUCE BONE BX UNI/BIL CANNULATION INC/IMAGING  02/23/2020   LEFT HEART CATH Right 03/02/2012   Procedure: LEFT HEART CATH;  Surgeon: Sherren Mocha, MD;  Location: [facility];  Service: Cardiovascular;  Laterality: Right;   MAZE N/A 03/06/2012   Procedure: MAZE;  Surgeon: Rexene Alberts, MD;  Location: [facility];  Service: Open Heart Surgery;  Laterality: N/A;   PERIPHERAL VASCULAR INTERVENTION Right 10/26/2020   Procedure: PERIPHERAL  VASCULAR INTERVENTION;  Surgeon: Broadus John, MD;  Location: [facility];  Service: Cardiovascular;  Laterality: Right;   PROSTATECTOMY     partial   RIGHT/LEFT HEART CATH AND CORONARY/GRAFT ANGIOGRAPHY N/A 09/29/2019   Procedure: RIGHT/LEFT HEART CATH AND CORONARY/GRAFT ANGIOGRAPHY;  Surgeon: Adrian Prows, MD;  Location: [facility];  Service: Cardiovascular;  Laterality: N/A;   TEE WITHOUT CARDIOVERSION N/A 11/10/2019   Procedure: TRANSESOPHAGEAL ECHOCARDIOGRAM (TEE);  Surgeon: Burnell Blanks,  MD;  Location: [facility];  Service: Open Heart Surgery;  Laterality: N/A;   TRANSCATHETER AORTIC VALVE REPLACEMENT, TRANSFEMORAL  11/10/2019   Patient Active Problem List   Diagnosis Date Noted   Left middle cerebral artery stroke ([place]) 05/31/2021   Acute CVA (cerebrovascular accident) ([place]) 05/25/2021   DNR (do not resuscitate) 05/25/2021   Osteoporosis 05/05/2021   Gouty arthritis 02/13/2021   Morbid obesity ([place]) 02/13/2021   Abdominal aortic aneurysm without rupture ([place]) 11/22/2020   Acute renal failure syndrome ([place]) 11/22/2020   Low back pain 11/22/2020   Skin tear of right upper arm without complication 31/54/0086   Pressure injury of skin 10/23/2020   Urinary tract infection with hematuria 10/22/2020   Encephalopathy 10/22/2020   AKI (acute kidney injury) ([place])    Overweight (BMI 25.0-29.9) 08/19/2020   Vertebral fracture, osteoporotic ([place]) 08/18/2020   T8 vertebral fracture ([place]) 08/17/2020   Thrombocytopenia ([place]) 08/17/2020   CKD (chronic kidney disease) stage 3, GFR 30-59 ml/min (HCC) 08/17/2020   Elevated troponin 08/17/2020   Pain due to onychomycosis of toenails of both feet 07/27/2020   Callus of heel 07/27/2020   Bifascicular block 11/11/2019   Acute on chronic diastolic heart failure ([place]) 11/10/2019   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   Severe aortic stenosis 09/29/2019   Aortic valve disorder 76/19/5093   Systolic heart failure ([place]) 03/11/2019   Thrombophilia ([place]) 02/26/2019   Muscle pain 02/11/2019   Weakness 02/06/2019   Polymyalgia rheumatica ([place]) 02/06/2019   Shoulder joint pain 02/02/2019   Anemia 01/26/2019   Malignant melanoma of skin ([place]) 01/26/2019   Pain in right leg 01/26/2019   DDD (degenerative disc disease), cervical 05/28/2018   Encounter for general adult medical examination without abnormal findings 11/19/2017   Carotid artery occlusion 07/03/2017   Hearing loss 07/03/2017   Fall 06/19/2017   Injury of  upper extremity 06/19/2017   Neck pain 06/19/2017   Headache 06/19/2017   Visual disturbance 06/19/2017   Ganglion of hand 05/08/2017   Dilatation of aorta ([place]) 02/14/2017   Plantar fascial fibromatosis 02/14/2017   Cardiac murmur, unspecified 02/27/2016   Benign neoplasm of colon 08/24/2015   Dementia ([place]) 08/24/2015   Influenza due to unidentified influenza virus with other respiratory manifestations 03/15/2015   Epistaxis 12/01/2014   OSA (obstructive sleep apnea) 11/18/2014   Gastro-esophageal reflux disease with esophagitis 10/29/2014   Angina pectoris ([place]) 05/04/2014   Abnormal feces 07/31/2013   Other specified disorders of gingiva and edentulous alveolar ridge 07/31/2013   Spontaneous ecchymosis 10/10/2012   Thoracic ascending aortic aneurysm ([place]) 09/29/2012   Microscopic hematuria 04/11/2012   Proteinuria 04/11/2012   S/P laparoscopic cholecystectomy 04/09/2012   S/P CABG x 3 03/06/2012   S/P Maze operation for atrial fibrillation 03/06/2012   Paroxysmal atrial fibrillation ([place]) 02/29/2012   Bitten or stung by nonvenomous insect and other nonvenomous arthropods, initial encounter 12/03/2011   Depression screening 10/17/2011   CAD (coronary artery disease), native coronary artery    Hyperlipidemia  Hypertension    Chronic venous insufficiency    Gout    Benign prostatic hyperplasia    Post-traumatic stress disorder 03/13/2011   Pain in left leg 12/08/2010   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   Fatigue 08/08/2010   Retention of urine 04/18/2010   Constipation 07/05/2009   Kidney stone 07/05/2009   ED (erectile dysfunction) of organic origin 12/14/2008   Impaired fasting glucose 12/14/2008   Vitamin B deficiency 12/14/2008    ONSET DATE: 05/25/21   REFERRING DIAG: Z00.923 (ICD-10-CM) - Left middle cerebral artery stroke   THERAPY DIAG:  Aphasia  Cognitive communication deficit  Rationale for Evaluation and Treatment Rehabilitation  SUBJECTIVE:    SUBJECTIVE STATEMENT: "I need to extrain (explain) it better for you." Pt accompanied by: self  PERTINENT HISTORY:  PREET PERRIER is a 88 y.o. male with medical history significant of AAA; CAD s/p CABG; chronic systolic CHF; afib s/p Maze, on Xarelto; HTN; HLD; PMR (currently on steroids); pre-diabetes; OSA on CPAP; PAD with lymphedema; and AS s/p TAVR presenting with stroke-like symptoms on 05/25/21. FAmily had a meeting with bad news.  Shortly thereafter, he was trembling on one side with slurred speech.  His episode lasted less than a minute but the speech difficulty persisted. He completed home health PT OT and speech and arrives at OP rehab for his continuation of rehabilitative care.  Inpatient rehab discharge note: "Patient has made excellent gains and has met 5 of 5 long-term goals this admission due to improved motor speech, word finding skills, complex problem solving, and attention. Pt is completing high level cognitive-linguistic tasks with mod I-to-sup A in regards to problem solving, attention, and functional recall. Pt demonstrates improved self-monitoring and self correction of verbal errors when communicating at the phrase/sentence level. Pt continues to exhibit mild higher level word finding, spelling, motor speech production, and alternating attention deficits. Patient and family education is complete and patient to discharge at overall mod I-to-sup A level. Patient's care partner is independent to provide the necessary physical and cognitive assistance at discharge. Pt would continue to benefit from skilled ST services in order to maximize functional independence and reduce burden of care, with continued skilled ST services at discharge."  PAIN:  Are you having pain? Yes: NPRS scale: 2/10 Pain location: lumbar spine Pain description: aching, sore, sharp Aggravating factors: standing/walking Relieving factors: recliner  FALLS: Has patient fallen in last 6 months?  Yes  2.  LIVING ENVIRONMENT: Lives with: lives with their spouse Lives in: House/apartment  PLOF:  Level of assistance: Independent with ADLs Employment: Retired   PATIENT GOALS  Speak intelligently, write with better spelling ability  OBJECTIVE:   DIAGNOSTIC FINDINGS:  MRI brain 05/25/21:  IMPRESSION: Small areas of acute infarction in the right occipital lobe and left frontal lobe over the convexity. Mild to moderate acute infarct left operculum. Possible cerebral emboli.  Atrophy and mild chronic microvascular ischemia   COGNITION: Overall cognitive status: Impaired Areas of impairment:  Executive function: Impaired: Organization, Planning, and Error awareness, assumed; pt stated he had difficulty with billpay using his checkbook. Functional deficits: See above.  OT to follow pt to formally treat pt's cognition.  AUDITORY COMPREHENSION: Overall auditory comprehension: Appears intact  READING COMPREHENSION: Intact  EXPRESSION: verbal  VERBAL EXPRESSION: Level of generative/spontaneous verbalization: conversation Automatic speech: month of year: impaired -pausing before second syllable of "November" Repetition: Impaired: phrase Naming: Confrontation: 100% , Divergent: pt named average 4 items in simple category in 60 seconds Pragmatics: Appears intact Comments:  Halting speech quality. Phonemic and semantic errors made in conversation but pt was functional with simple to mod complex conversation given consistent extra time. Examples : "strunk"/stroke, "brief"/grief, "fifty"/thirty, "responsibles"/responsibility, "extrain"/explain.During conversation today in the evaluation, pt demo'd anomia x4; pt successfully used synonym or circumlocution as strategies. Pt's self-monitoring his speech eliminated many errors, but the ones pt did make were corrected approx 20% of the time. Errors were close enough to target word (see examples prior) that pt remained functional in communicating  his intended message. Effective technique:  extra time  WRITTEN EXPRESSION: Dominant hand: right  Written expression: Not tested however pt reports "my spelling is atrocious now." SLP to assess writing in next session.  MOTOR SPEECH: Overall motor speech: Appears intact, with oral non-verbal apraxia likely  ORAL MOTOR EXAMINATION Overall status: WFL Comments: Coordination was decreased - likely oral non-verbal apraxia.   PATIENT REPORTED OUTCOME MEASURES (PROM): Communication Participation Item Bank: to be completed next session and Communication Effectiveness Survey: to be completed next session   TODAY'S TREATMENT:  Discussed evaluation results, probable goals, and discussed pt's need to write more and provide himself more opportunity for practice.   PATIENT EDUCATION: Education details: see above in "today's treatment" Person educated: Patient Education method: Explanation Education comprehension: verbalized understanding and needs further education   GOALS: Goals reviewed with patient? Yes  SHORT TERM GOALS: Target date: 09/26/2021    Pt will name average 8 items/60 seconds in simple category important to pt in 3 sessions Baseline: Goal status: INITIAL  2.  Pt will write to-do lists, errand lists (1-3 word responses) functionally in 3 sessions Baseline:  Goal status: INITIAL  3.  Pt will participate in 8 minutes functional mod complex conversation with modified independence in 2 sessions Baseline:  Goal status: INITIAL   LONG TERM GOALS: Target date: 10/24/2021   Pt will write sentences of 5+ words, functionally, with rare min A in 3 sessions Baseline:  Goal status: INITIAL  2.  3.  Pt will participate in 10 minutes functional mod complex-complex conversation with modified independence in 3 sessions Baseline:  Goal status: INITIAL  ASSESSMENT:  CLINICAL IMPRESSION: Patient is a 88 y.o. male who was seen today for assessment of speech and language  following CVA in May 2023. Pt's simple to mod complex verbal expression was functional with consistent extra time. He was able to communicate his intended message with success using circumlocution and synonym strategies.   OBJECTIVE IMPAIRMENTS include expressive language and aphasia. These impairments are limiting patient from ADLs/IADLs and effectively communicating at home and in community. Factors affecting potential to achieve goals and functional outcome are  none . Patient will benefit from skilled SLP services to address above impairments and improve overall function.  REHAB POTENTIAL: Good  PLAN: SLP FREQUENCY: 2x/week  SLP DURATION: 8 weeks  PLANNED INTERVENTIONS: Language facilitation, Environmental controls, Cueing hierachy, Internal/external aids, Oral motor exercises, Functional tasks, Multimodal communication approach, SLP instruction and feedback, Compensatory strategies, and Patient/family education    [place], [place] 08/29/2021, 3:21 PM

## 2021-08-29 NOTE — Therapy (Signed)
OUTPATIENT OCCUPATIONAL THERAPY NEURO EVALUATION  Patient Name: [name] [name] MRN: [id number] DOB:1926/09/23, 88 y.o., male Today's Date: 08/29/2021  PCP: [name], MD REFERRING PROVIDER: Charlett Blake, MD    OT End of Session - 08/29/21 1436     Visit Number 1    Number of Visits 17    Date for OT Re-Evaluation 10/27/21    Authorization Type BCBS Medicare    OT Start Time 1234    OT Stop Time 61    OT Time Calculation (min) 44 min    Activity Tolerance Patient tolerated treatment well    Behavior During Therapy WFL for tasks assessed/performed             Past Medical History:  Diagnosis Date   AAA (abdominal aortic aneurysm) ([place])    Arthritis    Ascending aorta dilatation ([place]) 03/13/2012   Fusiform dilatation of the ascending thoracic aorta discovered during surgery, max diameter 4.2-4.3 cm   BPH (benign prostatic hypertrophy)    Chronic systolic heart failure ([place]) 03/03/2012   Chronic venous insufficiency    GERD (gastroesophageal reflux disease)    Glaucoma    Gout    History of kidney stones    Hyperlipidemia    Hypertension    Obesity (BMI 30-39.9)    Paroxysmal atrial fibrillation ([place]) 02/29/2012   Recurrent paroxysmal, new-onset    PMR (polymyalgia rheumatica) (HCC)    Polymyalgia rheumatica ([place]) 02/06/2019   Pre-diabetes    S/P CABG x 3 03/06/2012   LIMA to LAD, SVG to OM, SVG to RCA, EVH via right thigh   S/P Maze operation for atrial fibrillation 03/06/2012   Complete bilateral lesions set using bipolar radiofrequency and cryothermy ablation with clipping of LA appendage   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   s/p TAVR with a 26 mm Edwards via the left subclavian by Drs Buena Irish and Bartle   Severe aortic stenosis 09/29/2019   Sleep apnea    USES CPAP NIGHTLY   Past Surgical History:  Procedure Laterality Date   ABDOMINAL AORTOGRAM W/LOWER EXTREMITY N/A 10/26/2020   Procedure: ABDOMINAL AORTOGRAM W/LOWER  EXTREMITY;  Surgeon: Broadus John, MD;  Location: [facility];  Service: Cardiovascular;  Laterality: N/A;   CARDIAC CATHETERIZATION     CATARACT EXTRACTION, BILATERAL     with lens implants   CHOLECYSTECTOMY N/A 03/24/2012   Procedure: LAPAROSCOPIC CHOLECYSTECTOMY WITH INTRAOPERATIVE CHOLANGIOGRAM;  Surgeon: Zenovia Jarred, MD;  Location: [facility];  Service: General;  Laterality: N/A;   CORONARY ARTERY BYPASS GRAFT N/A 03/06/2012   Procedure: CORONARY ARTERY BYPASS GRAFTING (CABG);  Surgeon: Rexene Alberts, MD;  Location: [place];  Service: Open Heart Surgery;  Laterality: N/A;   ENDOVEIN HARVEST OF GREATER SAPHENOUS VEIN Right 03/06/2012   Procedure: ENDOVEIN HARVEST OF GREATER SAPHENOUS VEIN;  Surgeon: Rexene Alberts, MD;  Location: [place];  Service: Open Heart Surgery;  Laterality: Right;   INTRAOPERATIVE TRANSESOPHAGEAL ECHOCARDIOGRAM N/A 03/06/2012   Procedure: INTRAOPERATIVE TRANSESOPHAGEAL ECHOCARDIOGRAM;  Surgeon: Rexene Alberts, MD;  Location: [place];  Service: Open Heart Surgery;  Laterality: N/A;   IR KYPHO LUMBAR INC FX REDUCE BONE BX UNI/BIL CANNULATION INC/IMAGING  02/23/2020   LEFT HEART CATH Right 03/02/2012   Procedure: LEFT HEART CATH;  Surgeon: Sherren Mocha, MD;  Location: [facility];  Service: Cardiovascular;  Laterality: Right;   MAZE N/A 03/06/2012   Procedure: MAZE;  Surgeon: Rexene Alberts, MD;  Location: [place];  Service:  Open Heart Surgery;  Laterality: N/A;   PERIPHERAL VASCULAR INTERVENTION Right 10/26/2020   Procedure: PERIPHERAL VASCULAR INTERVENTION;  Surgeon: Broadus John, MD;  Location: [place] [facility];  Service: Cardiovascular;  Laterality: Right;   PROSTATECTOMY     partial   RIGHT/LEFT HEART CATH AND CORONARY/GRAFT ANGIOGRAPHY N/A 09/29/2019   Procedure: RIGHT/LEFT HEART CATH AND CORONARY/GRAFT ANGIOGRAPHY;  Surgeon: Adrian Prows, MD;  Location: [place] [facility];  Service: Cardiovascular;  Laterality: N/A;   TEE WITHOUT CARDIOVERSION N/A 11/10/2019    Procedure: TRANSESOPHAGEAL ECHOCARDIOGRAM (TEE);  Surgeon: Burnell Blanks, MD;  Location: [place];  Service: Open Heart Surgery;  Laterality: N/A;   TRANSCATHETER AORTIC VALVE REPLACEMENT, TRANSFEMORAL  11/10/2019   Patient Active Problem List   Diagnosis Date Noted   Left middle cerebral artery stroke ([place]) 05/31/2021   Acute CVA (cerebrovascular accident) ([place]) 05/25/2021   DNR (do not resuscitate) 05/25/2021   Osteoporosis 05/05/2021   Gouty arthritis 02/13/2021   Morbid obesity ([place]) 02/13/2021   Abdominal aortic aneurysm without rupture ([place]) 11/22/2020   Acute renal failure syndrome ([place]) 11/22/2020   Low back pain 11/22/2020   Skin tear of right upper arm without complication 95/09/3265   Pressure injury of skin 10/23/2020   Urinary tract infection with hematuria 10/22/2020   Encephalopathy 10/22/2020   AKI (acute kidney injury) ([place])    Overweight (BMI 25.0-29.9) 08/19/2020   Vertebral fracture, osteoporotic ([place]) 08/18/2020   T8 vertebral fracture ([place]) 08/17/2020   Thrombocytopenia ([place]) 08/17/2020   CKD (chronic kidney disease) stage 3, GFR 30-59 ml/min (HCC) 08/17/2020   Elevated troponin 08/17/2020   Pain due to onychomycosis of toenails of both feet 07/27/2020   Callus of heel 07/27/2020   Bifascicular block 11/11/2019   Acute on chronic diastolic heart failure ([place]) 11/10/2019   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   Severe aortic stenosis 09/29/2019   Aortic valve disorder 12/45/8099   Systolic heart failure ([place]) 03/11/2019   Thrombophilia ([place]) 02/26/2019   Muscle pain 02/11/2019   Weakness 02/06/2019   Polymyalgia rheumatica ([place]) 02/06/2019   Shoulder joint pain 02/02/2019   Anemia 01/26/2019   Malignant melanoma of skin ([place]) 01/26/2019   Pain in right leg 01/26/2019   DDD (degenerative disc disease), cervical 05/28/2018   Encounter for general adult medical examination without abnormal findings 11/19/2017   Carotid artery  occlusion 07/03/2017   Hearing loss 07/03/2017   Fall 06/19/2017   Injury of upper extremity 06/19/2017   Neck pain 06/19/2017   Headache 06/19/2017   Visual disturbance 06/19/2017   Ganglion of hand 05/08/2017   Dilatation of aorta ([place]) 02/14/2017   Plantar fascial fibromatosis 02/14/2017   Cardiac murmur, unspecified 02/27/2016   Benign neoplasm of colon 08/24/2015   Dementia ([place]) 08/24/2015   Influenza due to unidentified influenza virus with other respiratory manifestations 03/15/2015   Epistaxis 12/01/2014   OSA (obstructive sleep apnea) 11/18/2014   Gastro-esophageal reflux disease with esophagitis 10/29/2014   Angina pectoris ([place]) 05/04/2014   Abnormal feces 07/31/2013   Other specified disorders of gingiva and edentulous alveolar ridge 07/31/2013   Spontaneous ecchymosis 10/10/2012   Thoracic ascending aortic aneurysm ([place]) 09/29/2012   Microscopic hematuria 04/11/2012   Proteinuria 04/11/2012   S/P laparoscopic cholecystectomy 04/09/2012   S/P CABG x 3 03/06/2012   S/P Maze operation for atrial fibrillation 03/06/2012   Paroxysmal atrial fibrillation ([place]) 02/29/2012   Bitten or stung by nonvenomous insect and other nonvenomous arthropods, initial encounter 12/03/2011   Depression  screening 10/17/2011   CAD (coronary artery disease), native coronary artery    Hyperlipidemia    Hypertension    Chronic venous insufficiency    Gout    Benign prostatic hyperplasia    Post-traumatic stress disorder 03/13/2011   Pain in left leg 12/08/2010   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   Fatigue 08/08/2010   Retention of urine 04/18/2010   Constipation 07/05/2009   Kidney stone 07/05/2009   ED (erectile dysfunction) of organic origin 12/14/2008   Impaired fasting glucose 12/14/2008   Vitamin B deficiency 12/14/2008    ONSET DATE: 05/25/2021  REFERRING DIAG: I29.798 (ICD-10-CM) - Left middle cerebral artery stroke   THERAPY DIAG:  Hemiplegia and hemiparesis following  cerebral infarction affecting right dominant side (HCC)  Other disturbances of skin sensation  Muscle weakness (generalized)  Attention and concentration deficit  Unsteadiness on feet  Rationale for Evaluation and Treatment Rehabilitation  SUBJECTIVE:   SUBJECTIVE STATEMENT: Pt reports wife and caregiver (3x/week) assist with ADLs and shower tranfers.  Pt states after d/c from [facility] that he had home health, "mostly PT" at home.  Pt reports that he would like to focus on his speech, writing, spelling, and math as they are all a challenge since his stroke.  "I need to function again."  Pt reports condition has deteriorated overall over the last few months, resulting in pressure wounds on legs, pt reports becoming "so debilitated" that he could not walk at all.  Pt accompanied by: self and significant other (dropped off pt)  PERTINENT HISTORY:  H/o AAA, CAD with CABG, compression fractures, chronic anemia followed by hematology services, chronic systolic congestive heart failure, A-fib/AF status post MAZE on Xarelto, hypertension, hyperlipidemia, PMR maintained on steroids, prediabetes, early dementia maintained on Namenda, chronic lower extremity wounds with Unna boot OSA with CPAP, PAD with lymphedema status post balloon angioplasty of posterior tibial artery October 2022 recent UTI placed on antibiotic therapy.  He does have a personal care attendant Monday Wednesday Friday for 3 hours.  Modified independent transfers power wheelchair bound x3 to 4 months prior to that ambulating short distances with a rollator.  Present 05/25/2021 with right-sided weakness and aphasia of acute onset.  Cranial CT scan showed small left MCA territory cortical infarct within the mid to posterior left frontal lobe.  CT angiogram head and neck no large vessel occlusion.   PRECAUTIONS: Fall  WEIGHT BEARING RESTRICTIONS No  PAIN:  Are you having pain? Yes: NPRS scale: 2/10 Pain location: back Pain  description: constant Aggravating factors: standing Relieving factors: injections, repositioning  FALLS: Has patient fallen in last 6 months? Yes. Number of falls 4, pt reports that he hasn't fallen in the last few months  LIVING ENVIRONMENT: Lives with: lives with their spouse Lives in: House/apartment Stairs:  Yes: steps on front and back of house, however has an elevator at the back, steps to 2nd floor but does not need to go upstairs Has following equipment at home: Environmental consultant - 2 wheeled, Environmental consultant - 4 wheeled, Wheelchair (power), Wheelchair (manual), shower chair, bed side commode, Grab bars, and elevator to access home from outside, hand held shower head  PLOF:  Has a caregiver 3x/week that assists with ADLs, d/t spinal compression fx pt has become more w/c bound in 5-6 months due to    [place] to talk properly; to write and to spell  OBJECTIVE:   HAND DOMINANCE: Right  ADLs: Transfers/ambulation related to ADLs: Mod I - supervision stand pivot transfers from w/c Eating:  reports great difficulty using a knife when cutting food, wife frequently cuts foods for him Grooming: Mod I from seated position UB Dressing: Setup LB Dressing: wife assists with pants, socks, shoes Toileting: Pt with occasional difficulty pulling pants back up after toileting, leans forward to complete hygiene Bathing: Supervision/setup  Tub Shower transfers: caregiver assisting with transfers in/out of shower providing supervision and occasional min assist Equipment: Shower seat with back, Grab bars, Walk in shower, bed side commode, Reacher, and Sock aid   IADLs: Medication management: Mod I Financial management: Pt reports difficulty with writing a check, math, spelling, but did most of financial management Handwriting: 50% legible and difficulty with spelling that has gotten worse since CVA  MOBILITY STATUS: Needs Assist: Mod I stand pivot from w/c, Supervision for shower transfers  POSTURE  COMMENTS:  rounded shoulders and forward head  ACTIVITY TOLERANCE: Activity tolerance: WFL for tasks assessed during eval  FUNCTIONAL OUTCOME MEASURES: FOTO: TBD at next session  UPPER EXTREMITY ROM     Active ROM Right eval Left eval  Shoulder flexion 118 115  Shoulder abduction [facility]  Shoulder adduction    Shoulder extension    Shoulder internal rotation    Shoulder external rotation    Elbow flexion WFL WFL  Elbow extension [facility] WFL  Wrist flexion    Wrist extension    Wrist ulnar deviation    Wrist radial deviation    Wrist pronation    Wrist supination    (Blank rows = not tested)   UPPER EXTREMITY MMT:     MMT Right eval Left eval  Shoulder flexion 4+/5 4+/5  Shoulder abduction    Shoulder adduction    Shoulder extension    Shoulder internal rotation    Shoulder external rotation    Middle trapezius    Lower trapezius    Elbow flexion 4+/5 4+/5  Elbow extension 4+/5 4+/5  Wrist flexion    Wrist extension    Wrist ulnar deviation    Wrist radial deviation    Wrist pronation    Wrist supination    (Blank rows = not tested)  HAND FUNCTION: Grip strength: Right: 30 lbs; Left: 30 lbs  COORDINATION: 9 Hole Peg test: Right: 1:06.53 sec; Left: 34.53 sec  SENSATION: Reports numbness and difficulty maintaining grasp on spoon/fork and writing utensil  COGNITION: Overall cognitive status: Within functional limits for tasks assessed  VISION: Subjective report: wears glasses for watching tv and when he was driving he would wear glasses for long distances Baseline vision: Wears glasses for distance only  TODAY'S TREATMENT:  N/A   PATIENT EDUCATION: Education details: Educated on role and purpose of OT as well as potential interventions and goals for therapy based on initial evaluation findings. Person educated: Patient Education method: Explanation Education comprehension: verbalized understanding and needs further education   HOME EXERCISE  PROGRAM: TBD    GOALS: Goals reviewed with patient? No  SHORT TERM GOALS: Target date: 09/29/2021  Pt will verbalize understanding of adapted strategies and/or potential AE needs to increase ease, safety, and independence w/ LB dressing. Baseline: Goal status: INITIAL  2.  Pt will be independent with FMC/GMC and strengthening HEP to increase independence with handwriting and cutting of foods. Baseline:  Goal status: INITIAL  3. Pt will demonstrate improved fine motor coordination for ADLs as evidenced by decreasing 9 hole peg test score for RUE by 5 secs  Baseline: 9 Hole Peg test: Right: 1:06.53 sec; Left: 34.53 sec  Goal status: INITIAL  LONG TERM GOALS: Target date: 10/27/2021  Pt will report ability to complete LB dressing with supervision for standing balance and use of AE as needed. Baseline:  Goal status: INITIAL  2.  Pt will demonstrated improved standing balance to stand for 1 min with alternating UE to allow for increased independence with LB dressing/clothing management post toileting. Baseline:  Goal status: INITIAL  3.  Pt will demonstrated improved working memory and recall during check writing/balancing check book by making 0 errors. Baseline:  Goal status: INITIAL  4.  Pt will demonstrate improved fine motor coordination for ADLs as evidenced by decreasing 9 hole peg test score for RUE by 10 secs Baseline: 9 Hole Peg test: Right: 1:06.53 sec; Left: 34.53 sec Goal status: INITIAL  5.  Pt will write a short paragraph with 90% legibility and no significant errors in spelling as evidenced by </= to 3 spelling errors. Baseline:  Goal status: INITIAL   ASSESSMENT:  CLINICAL IMPRESSION: Patient is a 88 y.o. male who was seen today for occupational therapy evaluation for RUE coordination and and working memory impairments s/p left MCA territory cortical infarct within the mid to posterior left frontal lobe.  Pt currently lives with spouse in a 2 level home,  with pt able to remain residing on main level with full bathroom and bedroom and without need to go upstairs and an elevator to access home. Pt's spouse assists with self-care tasks, particularly LB dressing and cutting up foods for him; he also has a caregiver 3x/week for 3 hours that assists with self-care tasks and shower transfers. Pt will benefit from skilled occupational therapy services to address strength and coordination, ROM, pain management, altered sensation, balance, GM/FM control, cognition, safety awareness, introduction of compensatory strategies/AE prn, and implementation of an HEP to improve participation and safety during ADLs and IADLs.   PERFORMANCE DEFICITS in functional skills including ADLs, IADLs, coordination, sensation, ROM, strength, pain, FMC, GMC, balance, body mechanics, endurance, decreased knowledge of use of DME, and UE functional use, cognitive skills including attention, problem solving, sequencing, and thought, and psychosocial skills including coping strategies, environmental adaptation, and routines and behaviors.   IMPAIRMENTS are limiting patient from ADLs and IADLs.   COMORBIDITIES may have co-morbidities  that affects occupational performance. Patient will benefit from skilled OT to address above impairments and improve overall function.  MODIFICATION OR ASSISTANCE TO COMPLETE EVALUATION: Min-Moderate modification of tasks or assist with assess necessary to complete an evaluation.  OT OCCUPATIONAL PROFILE AND HISTORY: Detailed assessment: Review of records and additional review of physical, cognitive, psychosocial history related to current functional performance.  CLINICAL DECISION MAKING: Moderate - several treatment options, min-mod task modification necessary  REHAB POTENTIAL: Good  EVALUATION COMPLEXITY: Moderate    PLAN: OT FREQUENCY: 2x/week  OT DURATION: 8 weeks  PLANNED INTERVENTIONS: self care/ADL training, therapeutic exercise,  therapeutic activity, neuromuscular re-education, balance training, functional mobility training, moist heat, cryotherapy, patient/family education, cognitive remediation/compensation, psychosocial skills training, energy conservation, coping strategies training, and DME and/or AE instructions  RECOMMENDED OTHER SERVICES: N/A  CONSULTED AND AGREED WITH PLAN OF CARE: Patient  PLAN FOR NEXT SESSION: initiate coordination HEP, engage in check book activity   Alette Kataoka, [place], OTR/L 08/29/2021, 2:38 PM

## 2021-08-29 NOTE — Therapy (Signed)
OUTPATIENT PHYSICAL THERAPY NEURO EVALUATION   Patient Name: [name] [name] MRN: [id number] DOB:17-Jan-1926, 88 y.o., male Today's Date: 08/29/2021   PCP: [name], MD REFERRING PROVIDER: Charlett Blake, MD    PT End of Session - 08/29/21 1352     Visit Number 1    Number of Visits 16    Date for PT Re-Evaluation 11/07/21    Authorization Type BCBS Medicare    Authorization Time Period no VL, no auth    PT Start Time 1400    PT Stop Time 1445    PT Time Calculation (min) 45 min    Equipment Utilized During Treatment Gait belt    Activity Tolerance Patient tolerated treatment well    Behavior During Therapy WFL for tasks assessed/performed             Past Medical History:  Diagnosis Date   AAA (abdominal aortic aneurysm) ([place])    Arthritis    Ascending aorta dilatation ([place]) 03/13/2012   Fusiform dilatation of the ascending thoracic aorta discovered during surgery, max diameter 4.2-4.3 cm   BPH (benign prostatic hypertrophy)    Chronic systolic heart failure ([place]) 03/03/2012   Chronic venous insufficiency    GERD (gastroesophageal reflux disease)    Glaucoma    Gout    History of kidney stones    Hyperlipidemia    Hypertension    Obesity (BMI 30-39.9)    Paroxysmal atrial fibrillation ([place]) 02/29/2012   Recurrent paroxysmal, new-onset    PMR (polymyalgia rheumatica) (HCC)    Polymyalgia rheumatica ([place]) 02/06/2019   Pre-diabetes    S/P CABG x 3 03/06/2012   LIMA to LAD, SVG to OM, SVG to RCA, EVH via right thigh   S/P Maze operation for atrial fibrillation 03/06/2012   Complete bilateral lesions set using bipolar radiofrequency and cryothermy ablation with clipping of LA appendage   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   s/p TAVR with a 26 mm Edwards via the left subclavian by Drs Buena Irish and Bartle   Severe aortic stenosis 09/29/2019   Sleep apnea    USES CPAP NIGHTLY   Past Surgical History:  Procedure Laterality Date    ABDOMINAL AORTOGRAM W/LOWER EXTREMITY N/A 10/26/2020   Procedure: ABDOMINAL AORTOGRAM W/LOWER EXTREMITY;  Surgeon: Broadus John, MD;  Location: [facility];  Service: Cardiovascular;  Laterality: N/A;   CARDIAC CATHETERIZATION     CATARACT EXTRACTION, BILATERAL     with lens implants   CHOLECYSTECTOMY N/A 03/24/2012   Procedure: LAPAROSCOPIC CHOLECYSTECTOMY WITH INTRAOPERATIVE CHOLANGIOGRAM;  Surgeon: Zenovia Jarred, MD;  Location: [facility];  Service: General;  Laterality: N/A;   CORONARY ARTERY BYPASS GRAFT N/A 03/06/2012   Procedure: CORONARY ARTERY BYPASS GRAFTING (CABG);  Surgeon: Rexene Alberts, MD;  Location: [facility];  Service: Open Heart Surgery;  Laterality: N/A;   ENDOVEIN HARVEST OF GREATER SAPHENOUS VEIN Right 03/06/2012   Procedure: ENDOVEIN HARVEST OF GREATER SAPHENOUS VEIN;  Surgeon: Rexene Alberts, MD;  Location: [facility];  Service: Open Heart Surgery;  Laterality: Right;   INTRAOPERATIVE TRANSESOPHAGEAL ECHOCARDIOGRAM N/A 03/06/2012   Procedure: INTRAOPERATIVE TRANSESOPHAGEAL ECHOCARDIOGRAM;  Surgeon: Rexene Alberts, MD;  Location: [facility];  Service: Open Heart Surgery;  Laterality: N/A;   IR KYPHO LUMBAR INC FX REDUCE BONE BX UNI/BIL CANNULATION INC/IMAGING  02/23/2020   LEFT HEART CATH Right 03/02/2012   Procedure: LEFT HEART CATH;  Surgeon: Sherren Mocha, MD;  Location[facility];  Service: Cardiovascular;  Laterality: Right;  MAZE N/A 03/06/2012   Procedure: MAZE;  Surgeon: Rexene Alberts, MD;  Location: [facility];  Service: Open Heart Surgery;  Laterality: N/A;   PERIPHERAL VASCULAR INTERVENTION Right 10/26/2020   Procedure: PERIPHERAL VASCULAR INTERVENTION;  Surgeon: Broadus John, MD;  Location: [facility];  Service: Cardiovascular;  Laterality: Right;   PROSTATECTOMY     partial   RIGHT/LEFT HEART CATH AND CORONARY/GRAFT ANGIOGRAPHY N/A 09/29/2019   Procedure: RIGHT/LEFT HEART CATH AND CORONARY/GRAFT ANGIOGRAPHY;  Surgeon: Adrian Prows, MD;  Location: [facility];  Service: Cardiovascular;  Laterality: N/A;   TEE WITHOUT CARDIOVERSION N/A 11/10/2019   Procedure: TRANSESOPHAGEAL ECHOCARDIOGRAM (TEE);  Surgeon: Burnell Blanks, MD;  Location: [place];  Service: Open Heart Surgery;  Laterality: N/A;   TRANSCATHETER AORTIC VALVE REPLACEMENT, TRANSFEMORAL  11/10/2019   Patient Active Problem List   Diagnosis Date Noted   Left middle cerebral artery stroke ([place]) 05/31/2021   Acute CVA (cerebrovascular accident) ([place]) 05/25/2021   DNR (do not resuscitate) 05/25/2021   Osteoporosis 05/05/2021   Gouty arthritis 02/13/2021   Morbid obesity ([place]) 02/13/2021   Abdominal aortic aneurysm without rupture ([place]) 11/22/2020   Acute renal failure syndrome ([place]) 11/22/2020   Low back pain 11/22/2020   Skin tear of right upper arm without complication 16/10/9602   Pressure injury of skin 10/23/2020   Urinary tract infection with hematuria 10/22/2020   Encephalopathy 10/22/2020   AKI (acute kidney injury) ([place])    Overweight (BMI 25.0-29.9) 08/19/2020   Vertebral fracture, osteoporotic ([place]) 08/18/2020   T8 vertebral fracture ([place]) 08/17/2020   Thrombocytopenia ([place]) 08/17/2020   CKD (chronic kidney disease) stage 3, GFR 30-59 ml/min (HCC) 08/17/2020   Elevated troponin 08/17/2020   Pain due to onychomycosis of toenails of both feet 07/27/2020   Callus of heel 07/27/2020   Bifascicular block 11/11/2019   Acute on chronic diastolic heart failure ([place]) 11/10/2019   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   Severe aortic stenosis 09/29/2019   Aortic valve disorder 54/09/8117   Systolic heart failure ([place]) 03/11/2019   Thrombophilia ([place]) 02/26/2019   Muscle pain 02/11/2019   Weakness 02/06/2019   Polymyalgia rheumatica ([place]) 02/06/2019   Shoulder joint pain 02/02/2019   Anemia 01/26/2019   Malignant melanoma of skin ([place]) 01/26/2019   Pain in right leg 01/26/2019   DDD (degenerative disc disease), cervical 05/28/2018    Encounter for general adult medical examination without abnormal findings 11/19/2017   Carotid artery occlusion 07/03/2017   Hearing loss 07/03/2017   Fall 06/19/2017   Injury of upper extremity 06/19/2017   Neck pain 06/19/2017   Headache 06/19/2017   Visual disturbance 06/19/2017   Ganglion of hand 05/08/2017   Dilatation of aorta ([place]) 02/14/2017   Plantar fascial fibromatosis 02/14/2017   Cardiac murmur, unspecified 02/27/2016   Benign neoplasm of colon 08/24/2015   Dementia ([place]) 08/24/2015   Influenza due to unidentified influenza virus with other respiratory manifestations 03/15/2015   Epistaxis 12/01/2014   OSA (obstructive sleep apnea) 11/18/2014   Gastro-esophageal reflux disease with esophagitis 10/29/2014   Angina pectoris ([place]) 05/04/2014   Abnormal feces 07/31/2013   Other specified disorders of gingiva and edentulous alveolar ridge 07/31/2013   Spontaneous ecchymosis 10/10/2012   Thoracic ascending aortic aneurysm ([place]) 09/29/2012   Microscopic hematuria 04/11/2012   Proteinuria 04/11/2012   S/P laparoscopic cholecystectomy 04/09/2012   S/P CABG x 3 03/06/2012   S/P Maze operation for atrial fibrillation 03/06/2012   Paroxysmal atrial fibrillation ([place])  02/29/2012   Bitten or stung by nonvenomous insect and other nonvenomous arthropods, initial encounter 12/03/2011   Depression screening 10/17/2011   CAD (coronary artery disease), native coronary artery    Hyperlipidemia    Hypertension    Chronic venous insufficiency    Gout    Benign prostatic hyperplasia    Post-traumatic stress disorder 03/13/2011   Pain in left leg 12/08/2010   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   Fatigue 08/08/2010   Retention of urine 04/18/2010   Constipation 07/05/2009   Kidney stone 07/05/2009   ED (erectile dysfunction) of organic origin 12/14/2008   Impaired fasting glucose 12/14/2008   Vitamin B deficiency 12/14/2008    ONSET DATE: 05/31/21  REFERRING DIAG: Q22.297  (ICD-10-CM) - Left middle cerebral artery stroke ([place])   THERAPY DIAG:  Difficulty in walking, not elsewhere classified  Muscle weakness (generalized)  Other abnormalities of gait and mobility  Unsteadiness on feet  Rationale for Evaluation and Treatment Rehabilitation  SUBJECTIVE:                                                                                                                                                                                              SUBJECTIVE STATEMENT: Suffered L1 compression fracture and back pain approx December 2021 or January 2022 and had kyphoplasty and notes ongoing back pain and limited mobility since that time. Had left MCA CVA on 05/25/21 which exacerbated weakness and mobility deficits.   Pt accompanied by: self  PERTINENT HISTORY: medical history significant of AAA; CAD s/p CABG; chronic systolic CHF; afib s/p Maze, on Xarelto; HTN; HLD; PMR (currently on steroids); pre-diabetes; OSA on CPAP; PAD with lymphedema; and AS s/p TAVR   PAIN:  Are you having pain? Yes: NPRS scale: 2/10 Pain location: lumbar spine Pain description: aching, sore, sharp Aggravating factors: standing/walking Relieving factors: recliner  PRECAUTIONS: Fall, pressure sores bilateral calcaneus right > left  WEIGHT BEARING RESTRICTIONS No  FALLS: Has patient fallen in last 6 months? Yes. Number of falls 2+  LIVING ENVIRONMENT: Lives with: lives with their spouse Lives in: House/apartment, ground floor set-up Stairs: Yes: Internal: 12 steps; elevator to enter/exit home Has following equipment at home:  manual w/c, power chair,   PLOF: Needs assistance with ADLs and prior to back injury 9 months ago pt reports very active and independent lifestyle  PATIENT GOALS be able to stand unsupported for a time to perform ADL, walking if possible  OBJECTIVE:   DIAGNOSTIC FINDINGS: IMPRESSION: Small areas of acute infarction in the right occipital lobe and  left frontal lobe over the convexity. Mild to moderate acute infarct left operculum.  Possible cerebral emboli.   Atrophy and mild chronic microvascular ischemia  COGNITION: Overall cognitive status: Within functional limits for tasks assessed Patient's historical recall is not very accurate, he reports his back injury presented x 9 months ago, whereas medical record notes encounter in 12/2019-01/2020 regarding L1 compression fx   SENSATION: Not tested  COORDINATION: Alt. Foot taps impaired due to dorsiflexion weakness  EDEMA:  Bilateral LE, wearing compression stockings. Uses lymphedema pump  MUSCLE TONE: DNT    POSTURE: rounded shoulders  LOWER EXTREMITY ROM:     Passive  Right Eval Left Eval  Hip flexion    Hip extension    Hip abduction    Hip adduction    Hip internal rotation    Hip external rotation    Knee flexion    Knee extension    Ankle dorsiflexion -10 -10  Ankle plantarflexion    Ankle inversion    Ankle eversion     (Blank rows = not tested)  LOWER EXTREMITY MMT:    MMT Right Eval Left Eval  Hip flexion 3 3  Hip extension    Hip abduction 2+ 2+  Hip adduction 3 3  Hip internal rotation    Hip external rotation    Knee flexion 3+ 3+  Knee extension 4- 4-  Ankle dorsiflexion 0 0  Ankle plantarflexion    Ankle inversion    Ankle eversion    (Blank rows = not tested)  BED MOBILITY:  Sit to supine Min A and Mod A Supine to sit Min A Pt notes his bed at home has assist rails  TRANSFERS: Assistive device utilized: Wheelchair (manual)  Sit to stand: CGA and Min A Stand to sit: CGA and Min A Chair to chair: CGA and Min A Floor:  DNT   W/C mobility Independent level surfaces  STAIRS:  DNT--pt has elevator to enter/exit home  GAIT: DNT due to safety concerns  FUNCTIONAL TESTs:  5 times sit to stand: unable, requires BUE and assist Static standing: dependent on BUE via RW (supported standing) Straight Leg Raise: 20+ degrees  extensor lag  PATIENT SURVEYS:  FOTO to be assessed     PATIENT EDUCATION: Education details: assessment findings Person educated: Patient Education method: Explanation Education comprehension: verbalized understanding   HOME EXERCISE PROGRAM: To be initiated, demonstration/rehearsal of hip add isometric    GOALS: Goals reviewed with patient? Yes  SHORT TERM GOALS: Target date: 09/26/2021  Patient will be independent in HEP to improve functional outcomes Baseline: Goal status: INITIAL  2.  Patient to demonstrate sit to supine w/ supervision using adaptive equipment to reduce level of assist from caregiver Baseline: Min-mod A for BLE to clear EOB Goal status: INITIAL    LONG TERM GOALS: Target date: 10/24/2021  Demonstrate improved hip abduction strength to 3+/5 to improve standing stability Baseline: 2+/5 Goal status: INITIAL  2.  Manifest improved BLE strength as evidenced by ability to perform 5xSTS test in 30 sec using BUE Baseline: unable Goal status: INITIAL  3.  Demonstrate static standing balance of Fair (no UE support) x 15 sec to improve safety with ADL performance Baseline: unable, dependent on BUE (Fair-) Goal status: INITIAL  4.  Demonstrate transfers w/ set-up assist to increase independence and safety with mobility Baseline: CGA-min A Goal status: INITIAL   ASSESSMENT:  CLINICAL IMPRESSION: Patient is a 88 y.o. male who was seen today for physical therapy evaluation and treatment for generalized weakness and mobility deficits.  Pt with multiple co-morbid  issues impacting his functional mobility and activity tolerance indicating need for PT services to intervene for training in restorative, adaptive, and compensatory strategies to foster and facilitate greater functional independence and mobility to improve quality of life and reduce burden of care.    OBJECTIVE IMPAIRMENTS cardiopulmonary status limiting activity, decreased activity tolerance,  decreased balance, decreased knowledge of use of DME, decreased mobility, difficulty walking, decreased ROM, decreased strength, impaired perceived functional ability, and pain.   ACTIVITY LIMITATIONS lifting, bending, standing, squatting, transfers, bed mobility, toileting, dressing, and locomotion level  PARTICIPATION LIMITATIONS: meal prep, cleaning, community activity, yard work, and home mgmt  PERSONAL FACTORS Age, Time since onset of injury/illness/exacerbation, and 3+ comorbidities: see PMH  are also affecting patient's functional outcome.   REHAB POTENTIAL: Good  CLINICAL DECISION MAKING: Evolving/moderate complexity  EVALUATION COMPLEXITY: Moderate  PLAN: PT FREQUENCY: 2x/week  PT DURATION: 8 weeks  PLANNED INTERVENTIONS: Therapeutic exercises, Therapeutic activity, Neuromuscular re-education, Balance training, Gait training, Patient/Family education, Self Care, Joint mobilization, Stair training, Vestibular training, Canalith repositioning, Orthotic/Fit training, DME instructions, Aquatic Therapy, Dry Needling, Electrical stimulation, Wheelchair mobility training, Spinal mobilization, Cryotherapy, Moist heat, Taping, Ionotophoresis '4mg'$ /ml Dexamethasone, and Manual therapy  PLAN FOR NEXT SESSION: bed mobility with leg lifter, bed-based HEP for hip/LE strength   3:17 PM, 08/29/21 M. Sherlyn Lees, PT, DPT Physical Therapist- [place] Office Number: [phone]

## 2021-08-29 NOTE — Patient Instructions (Signed)
   I want you to write more! The reason your writing/spelling is so troublesome is because of the aphasia (language difficulty). If you have more practice with writing you will improve.

## 2021-08-30 ENCOUNTER — Encounter (HOSPITAL_BASED_OUTPATIENT_CLINIC_OR_DEPARTMENT_OTHER): Payer: Medicare Other | Admitting: Physician Assistant

## 2021-08-30 DIAGNOSIS — I872 Venous insufficiency (chronic) (peripheral): Secondary | ICD-10-CM | POA: Diagnosis not present

## 2021-08-30 DIAGNOSIS — Z951 Presence of aortocoronary bypass graft: Secondary | ICD-10-CM | POA: Diagnosis not present

## 2021-08-30 DIAGNOSIS — N401 Enlarged prostate with lower urinary tract symptoms: Secondary | ICD-10-CM | POA: Diagnosis not present

## 2021-08-30 DIAGNOSIS — L97312 Non-pressure chronic ulcer of right ankle with fat layer exposed: Secondary | ICD-10-CM | POA: Diagnosis not present

## 2021-08-30 DIAGNOSIS — L89153 Pressure ulcer of sacral region, stage 3: Secondary | ICD-10-CM | POA: Diagnosis not present

## 2021-08-30 DIAGNOSIS — I129 Hypertensive chronic kidney disease with stage 1 through stage 4 chronic kidney disease, or unspecified chronic kidney disease: Secondary | ICD-10-CM | POA: Diagnosis not present

## 2021-08-30 DIAGNOSIS — S71111A Laceration without foreign body, right thigh, initial encounter: Secondary | ICD-10-CM | POA: Diagnosis not present

## 2021-08-30 DIAGNOSIS — L97822 Non-pressure chronic ulcer of other part of left lower leg with fat layer exposed: Secondary | ICD-10-CM | POA: Diagnosis not present

## 2021-08-30 DIAGNOSIS — Z8673 Personal history of transient ischemic attack (TIA), and cerebral infarction without residual deficits: Secondary | ICD-10-CM | POA: Diagnosis not present

## 2021-08-30 DIAGNOSIS — S91301A Unspecified open wound, right foot, initial encounter: Secondary | ICD-10-CM | POA: Diagnosis not present

## 2021-08-30 DIAGNOSIS — N183 Chronic kidney disease, stage 3 unspecified: Secondary | ICD-10-CM | POA: Diagnosis not present

## 2021-08-30 DIAGNOSIS — I7389 Other specified peripheral vascular diseases: Secondary | ICD-10-CM | POA: Diagnosis not present

## 2021-08-30 DIAGNOSIS — L97322 Non-pressure chronic ulcer of left ankle with fat layer exposed: Secondary | ICD-10-CM | POA: Diagnosis not present

## 2021-08-30 DIAGNOSIS — L89613 Pressure ulcer of right heel, stage 3: Secondary | ICD-10-CM | POA: Diagnosis not present

## 2021-08-30 DIAGNOSIS — T8131XA Disruption of external operation (surgical) wound, not elsewhere classified, initial encounter: Secondary | ICD-10-CM | POA: Diagnosis not present
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
## 2021-08-30 NOTE — Progress Notes (Signed)
[name] [name] IMarland Kitchen ([id number]) Visit Report for 08/30/2021 Arrival Information Details Patient Name: Date of Service: [name] [name] ID I. 08/30/2021 12:45 PM Medical Record Number: [id number] Patient Account Number: [id number] Date of Birth/Sex: Treating RN: January 24, 1926 (88 y.o. [name] [name] Primary Care [name] [name]: [name] [name] Other Clinician: Referring [name] [name]: Treating [name] [name]/Extender: [name] [name] in Treatment: 62 Visit Information History Since Last Visit Added or deleted any medications: No Patient Arrived: Wheel Chair Any new allergies or adverse reactions: No Arrival Time: 13:00 Had a fall or experienced change in No Accompanied By: wife activities of daily living that may affect Transfer Assistance: Manual risk of falls: Patient Identification Verified: Yes Signs or symptoms of abuse/neglect since last visito No Secondary Verification Process Completed: Yes Hospitalized since last visit: No Patient Requires Transmission-Based Precautions: No Implantable device outside of the clinic excluding No Patient Has Alerts: Yes cellular tissue based products placed in the center Patient Alerts: Patient on Blood Thinner since last visit: Has Dressing in Place as Prescribed: Yes Has Footwear/Offloading in Place as Prescribed: Yes Right: Other:bunny boot Pain Present Now: No Electronic Signature(s) Signed: 08/30/2021 4:57:16 PM By: [name] Pilling RN, BSN Entered By: [name] [name] on 08/30/2021 13:00:35 -------------------------------------------------------------------------------- Clinic Level of Care Assessment Details Patient Name: Date of Service: [name] [name] ID I. 08/30/2021 12:45 PM Medical Record Number: [id number] Patient Account Number: [id number] Date of Birth/Sex: Treating RN: [birth year] (88 y.o. [name] [name] Primary Care [name] [name]: [name] [name] Other Clinician: Referring [name] [name]: Treating [name] [name]/Extender: [name] [name] in Treatment: 79 Clinic Level of Care Assessment Items TOOL 4 Quantity Score X- 1 0 Use when only an EandM is performed on FOLLOW-UP visit ASSESSMENTS - Nursing Assessment / Reassessment X- 1 10 Reassessment of Co-morbidities (includes updates in patient status) X- 1 5 Reassessment of Adherence to Treatment Plan ASSESSMENTS - Wound and Skin A ssessment / Reassessment '[]'$  - 0 Simple Wound Assessment / Reassessment - one wound X- 6 5 Complex Wound Assessment / Reassessment - multiple wounds X- 1 10 Dermatologic / Skin Assessment (not related to wound area) ASSESSMENTS - Focused Assessment X- 2 5 Circumferential Edema Measurements - multi extremities '[]'$  - 0 Nutritional Assessment / Counseling / Intervention '[]'$  - 0 Lower Extremity Assessment (monofilament, tuning fork, pulses) '[]'$  - 0 Peripheral Arterial Disease Assessment (using hand held doppler) ASSESSMENTS - Ostomy and/or Continence Assessment and Care '[]'$  - 0 Incontinence Assessment and Management '[]'$  - 0 Ostomy Care Assessment and Management (repouching, etc.) PROCESS - Coordination of Care '[]'$  - 0 Simple Patient / Family Education for ongoing care X- 1 20 Complex (extensive) Patient / Family Education for ongoing care X- 1 10 Staff obtains Programmer, systems, Records, T Results / Process Orders est '[]'$  - 0 Staff telephones HHA, Nursing Homes / Clarify orders / etc '[]'$  - 0 Routine Transfer to another Facility (non-emergent condition) '[]'$  - 0 Routine Hospital Admission (non-emergent condition) '[]'$  - 0 New Admissions / Biomedical engineer / Ordering NPWT Apligraf, etc. , '[]'$  - 0 Emergency Hospital Admission (emergent condition) '[]'$  - 0 Simple Discharge Coordination X- 1 15 Complex (extensive) Discharge Coordination PROCESS - Special Needs '[]'$  - 0 Pediatric / Minor Patient Management '[]'$  - 0 Isolation Patient Management '[]'$  - 0 Hearing / Language / Visual special needs '[]'$  - 0 Assessment of Community assistance  (transportation, D/C planning, etc.) '[]'$  - 0 Additional assistance / Altered mentation '[]'$  - 0 Support Surface(s) Assessment (bed, cushion, seat, etc.)  INTERVENTIONS - Wound Cleansing / Measurement '[]'$  - 0 Simple Wound Cleansing - one wound X- 6 5 Complex Wound Cleansing - multiple wounds X- 1 5 Wound Imaging (photographs - any number of wounds) '[]'$  - 0 Wound Tracing (instead of photographs) '[]'$  - 0 Simple Wound Measurement - one wound X- 6 5 Complex Wound Measurement - multiple wounds INTERVENTIONS - Wound Dressings X - Small Wound Dressing one or multiple wounds 2 10 X- 1 15 Medium Wound Dressing one or multiple wounds '[]'$  - 0 Large Wound Dressing one or multiple wounds '[]'$  - 0 Application of Medications - topical '[]'$  - 0 Application of Medications - injection INTERVENTIONS - Miscellaneous '[]'$  - 0 External ear exam '[]'$  - 0 Specimen Collection (cultures, biopsies, blood, body fluids, etc.) '[]'$  - 0 Specimen(s) / Culture(s) sent or taken to Lab for analysis '[]'$  - 0 Patient Transfer (multiple staff / Civil Service fast streamer / Similar devices) '[]'$  - 0 Simple Staple / Suture removal (25 or less) '[]'$  - 0 Complex Staple / Suture removal (26 or more) '[]'$  - 0 Hypo / Hyperglycemic Management (close monitor of Blood Glucose) '[]'$  - 0 Ankle / Brachial Index (ABI) - do not check if billed separately X- 1 5 Vital Signs Has the patient been seen at the hospital within the last three years: Yes Total Score: 215 Level Of Care: New/Established - Level 5 Electronic Signature(s) Signed: 08/30/2021 4:57:16 PM By: [name] Pilling RN, BSN Entered By: [name] [name] on 08/30/2021 13:29:11 -------------------------------------------------------------------------------- Encounter Discharge Information Details Patient Name: Date of Service: [name] [name], [name] 08/30/2021 12:45 PM Medical Record Number: [id number] Patient Account Number: [id number] Date of Birth/Sex: Treating RN: [birth year] (88 y.o. [name] [name] Primary Care Alyha Marines: [name] [name] Other Clinician: Referring [name] [name]: Treating [name] [name]/Extender: [name] [name] in Treatment: 48 Encounter Discharge Information Items Discharge Condition: Stable Ambulatory Status: Wheelchair Discharge Destination: Home Transportation: Private Auto Accompanied By: wife Schedule Follow-up Appointment: Yes Clinical Summary of Care: Electronic Signature(s) Signed: 08/30/2021 4:57:16 PM By: [name] Pilling RN, BSN Entered By: [name] [name] on 08/30/2021 13:29:40 -------------------------------------------------------------------------------- Lower Extremity Assessment Details Patient Name: Date of Service: [name] [name] Spanish ID I. 08/30/2021 12:45 PM Medical Record Number: [id number] Patient Account Number: [id number] Date of Birth/Sex: Treating RN: [birth year] (88 y.o. [name] [name] Primary Care Guynell Kleiber: [name] [name] Other Clinician: Referring Aja Whitehair: Treating Logann Whitebread/Extender: [name] in Treatment: 49 Edema Assessment Assessed: [Left: Yes] [Right: Yes] Edema: [Left: Yes] [Right: Yes] Calf Left: Right: Point of Measurement: 34 cm From Medial Instep 36 cm 36 cm Ankle Left: Right: Point of Measurement: 11 cm From Medial Instep 26 cm 24 cm Vascular Assessment Pulses: Dorsalis Pedis Palpable: [Left:Yes] [Right:Yes] Electronic Signature(s) Signed: 08/30/2021 4:57:16 PM By: [name] Pilling RN, BSN Entered By: [name] [name] on 08/30/2021 13:14:14 -------------------------------------------------------------------------------- Multi-Disciplinary Care Plan Details Patient Name: Date of Service: [name] [name], Shaune Pascal ID I. 08/30/2021 12:45 PM Medical Record Number: [id number] Patient Account Number: [id number] Date of Birth/Sex: Treating RN: 02-03-1926 (88 y.o. [name] [name] Primary Care Thuy Atilano: [name] [name] Other Clinician: Referring Werner Labella: Treating Makayia Duplessis/Extender: [name] [name] in Treatment: 35 Active Inactive Abuse / Safety / Falls / Self Care Management Nursing Diagnoses: Potential for falls Goals: Patient will remain injury free related to falls Date Initiated: 03/22/2021 Target Resolution Date: 09/29/2021 Goal Status: Active Interventions: Assess: immobility, friction, shearing, incontinence upon admission and as needed Assess impairment of mobility on  admission and as needed per policy Assess self care needs on admission and as needed Provide education on fall prevention Notes: Nutrition Nursing Diagnoses: Potential for alteratiion in Nutrition/Potential for imbalanced nutrition Goals: Patient/caregiver agrees to and verbalizes understanding of need to obtain nutritional consultation Date Initiated: 03/22/2021 T arget Resolution Date: 09/29/2021 Goal Status: Active Interventions: Provide education on nutrition Treatment Activities: Education provided on Nutrition : 07/19/2021 Giving encouragement to exercise : 03/22/2021 Notes: Electronic Signature(s) Signed: 08/30/2021 4:57:16 PM By: [name] Pilling RN, BSN Entered By: [name] [name] on 08/30/2021 13:17:28 -------------------------------------------------------------------------------- Pain Assessment Details Patient Name: Date of Service: [name] [name] ID I. 08/30/2021 12:45 PM Medical Record Number: [id number] Patient Account Number: [id number] Date of Birth/Sex: Treating RN: 1926/12/14 (88 y.o. [name] [name] Primary Care Jaythan Hinely: [name] [name] Other Clinician: Referring Jazleen Robeck: Treating Devonia Farro/Extender: [name] [name] in Treatment: 15 Active Problems Location of Pain Severity and Description of Pain Patient Has Paino No Site Locations Rate the pain. Current Pain Level: 0 Pain Management and Medication Current Pain Management: Medication: No Cold Application: No Rest: No Massage: No Activity: No T.E.N.S.: No Heat Application:  No Leg drop or elevation: No Is the Current Pain Management Adequate: Adequate How does your wound impact your activities of daily livingo Sleep: No Bathing: No Appetite: No Relationship With Others: No Bladder Continence: No Emotions: No Bowel Continence: No Work: No Toileting: No Drive: No Dressing: No Hobbies: No Engineer, maintenance) Signed: 08/30/2021 4:57:16 PM By: [name] Pilling RN, BSN Entered By: [name] [name] on 08/30/2021 13:01:00 -------------------------------------------------------------------------------- Patient/Caregiver Education Details Patient Name: Date of Service: [name] [name] ID I. 8/30/2023andnbsp12:45 PM Medical Record Number: [id number] Patient Account Number: [id number] Date of Birth/Gender: Treating RN: 1926-08-20 (88 y.o. [name] [name] Primary Care Physician: [name] [name] Other Clinician: Referring Physician: Treating Physician/Extender: [name] [name] in Treatment: 58 Education Assessment Education Provided To: Patient and Caregiver Education Topics Provided Safety: Methods: Explain/Verbal Responses: Reinforcements needed Engineer, maintenance) Signed: 08/30/2021 4:57:16 PM By: [name] Pilling RN, BSN Entered By: [name] [name] on 08/30/2021 13:17:40 -------------------------------------------------------------------------------- Wound Assessment Details Patient Name: Date of Service: [name] [name] ID I. 08/30/2021 12:45 PM Medical Record Number: [id number] Patient Account Number: [id number] Date of Birth/Sex: Treating RN: Jul 20, 1926 (88 y.o. [name], [name] Primary Care Tylar Amborn: [name] [name] Other Clinician: Referring Nida Manfredi: Treating Jennings Stirling/Extender: [name] [name] in Treatment: 25 Wound Status Wound Number: 14 Primary Open Surgical Wound Etiology: Wound Location: Left, Anterior Lower Leg Wound Open Wounding Event: Surgical Injury Status: Date Acquired: 05/26/2021 Comorbid  Cataracts, Sleep Apnea, Arrhythmia, Congestive Heart Failure, Weeks Of Treatment: 8 History: Coronary Artery Disease, Hypertension, Myocardial Infarction, Clustered Wound: No End Stage Renal Disease, Gout, Dementia, Neuropathy Photos Wound Measurements Length: (cm) 2.3 Width: (cm) 2.2 Depth: (cm) 0.2 Area: (cm) 3.974 Volume: (cm) 0.795 % Reduction in Area: -1.2% % Reduction in Volume: -1.3% Epithelialization: Medium (34-66%) Tunneling: No Undermining: No Wound Description Classification: Full Thickness Without Exposed Support Structures Wound Margin: Epibole Exudate Amount: Medium Exudate Type: Serosanguineous Exudate Color: red, brown Foul Odor After Cleansing: No Slough/Fibrino Yes Wound Bed Granulation Amount: Large (67-100%) Exposed Structure Granulation Quality: Red, Hyper-granulation, Friable Fascia Exposed: No Necrotic Amount: Small (1-33%) Fat Layer (Subcutaneous Tissue) Exposed: Yes Necrotic Quality: Adherent Slough Tendon Exposed: No Muscle Exposed: No Joint Exposed: No Bone Exposed: No Treatment Notes Wound #14 (Lower Leg) Wound Laterality: Left, Anterior Cleanser Soap and Water Discharge Instruction: May shower  and wash wound with dial antibacterial soap and water prior to dressing change. Peri-Wound Care Skin Prep Discharge Instruction: Use skin prep as directed Topical Primary Dressing Promogran Prisma Matrix, 4.34 (sq in) (silver collagen) Discharge Instruction: Moisten collagen with hydrogel or KY jelly. Secondary Dressing Zetuvit Plus Silicone Border Dressing 4x4 (in/in) Discharge Instruction: Apply silicone border over primary dressing as directed. Secured With Compression Wrap Compression Stockings Environmental education officer) Signed: 08/30/2021 4:57:16 PM By: [name] Pilling RN, BSN Entered By: [name] [name] on 08/30/2021 13:11:31 -------------------------------------------------------------------------------- Wound Assessment  Details Patient Name: Date of Service: [name] [name] ID I. 08/30/2021 12:45 PM Medical Record Number: [id number] Patient Account Number: [id number] Date of Birth/Sex: Treating RN: 1926-10-07 (88 y.o. [name], [name] Primary Care D'Arcy Abraha: [name] [name] Other Clinician: Referring Carrson Lightcap: Treating Sameera Betton/Extender: [name] [name] in Treatment: 98 Wound Status Wound Number: 15 Primary Pressure Ulcer Etiology: Wound Location: Right Calcaneus Secondary Abrasion Wounding Event: Trauma Etiology: Date Acquired: 07/12/2021 Wound Open Weeks Of Treatment: 6 Status: Clustered Wound: No Comorbid Cataracts, Sleep Apnea, Arrhythmia, Congestive Heart Failure, History: Coronary Artery Disease, Hypertension, Myocardial Infarction, End Stage Renal Disease, Gout, Dementia, Neuropathy Photos Wound Measurements Length: (cm) 1.8 Width: (cm) 3 Depth: (cm) 0.2 Area: (cm) 4.241 Volume: (cm) 0.848 % Reduction in Area: 60.7% % Reduction in Volume: 21.5% Epithelialization: Medium (34-66%) Tunneling: No Wound Description Classification: Category/Stage III Wound Margin: Distinct, outline attached Exudate Amount: Medium Exudate Type: Serosanguineous Exudate Color: red, brown Foul Odor After Cleansing: No Slough/Fibrino Yes Wound Bed Granulation Amount: Large (67-100%) Exposed Structure Granulation Quality: Red, Pink Fascia Exposed: No Necrotic Amount: Small (1-33%) Fat Layer (Subcutaneous Tissue) Exposed: Yes Necrotic Quality: Adherent Slough Tendon Exposed: No Muscle Exposed: No Joint Exposed: No Bone Exposed: No Treatment Notes Wound #15 (Calcaneus) Wound Laterality: Right Cleanser Soap and Water Discharge Instruction: May shower and wash wound with dial antibacterial soap and water prior to dressing change. Wound Cleanser Discharge Instruction: Cleanse the wound with wound cleanser prior to applying a clean dressing using gauze sponges, not tissue or  cotton balls. Peri-Wound Care Topical Primary Dressing Iodosorb Gel 10 (gm) Tube Discharge Instruction: Apply to wound bed as instructed Secondary Dressing ALLEVYN Heel 4 1/2in x 5 1/2in / 10.5cm x 13.5cm Discharge Instruction: Apply over primary dressing as directed. T Non-adherent Dressing, 2x3 in elfa Discharge Instruction: Apply over primary dressing as directed. Woven Gauze Sponge, Non-Sterile 4x4 in Discharge Instruction: ***PLEASE SEND BULK.***Apply over primary dressing as directed. Secured With [employer], 4.5x3.1 (in/yd) Discharge Instruction: Secure with Kerlix as directed. Compression Wrap Compression Stockings Add-Ons Electronic Signature(s) Signed: 08/30/2021 4:57:16 PM By: [name] Pilling RN, BSN Entered By: [name] [name] on 08/30/2021 13:12:04 -------------------------------------------------------------------------------- Wound Assessment Details Patient Name: Date of Service: [name] [name] ID I. 08/30/2021 12:45 PM Medical Record Number: [id number] Patient Account Number: [id number] Date of Birth/Sex: Treating RN: [birth year] (88 y.o. [name], [name] Primary Care Forney Kleinpeter: [name] [name] Other Clinician: Referring Hester Joslin: Treating Maze Corniel/Extender: [name] [name] in Treatment: 29 Wound Status Wound Number: 17 Primary Abrasion Etiology: Wound Location: Left, Lateral Lower Leg Wound Healed - Epithelialized Wounding Event: Skin Tear/Laceration Status: Date Acquired: 08/11/2021 Comorbid Cataracts, Sleep Apnea, Arrhythmia, Congestive Heart Failure, Weeks Of Treatment: 2 History: Coronary Artery Disease, Hypertension, Myocardial Infarction, Clustered Wound: Yes End Stage Renal Disease, Gout, Dementia, Neuropathy Photos Wound Measurements Length: (cm) Width: (cm) Depth: (cm) Clustered Quantity: Area: (cm) Volume: (cm) 0 % Reduction in Area: 100% 0 %  Reduction in Volume: 100% 0 Epithelialization: Large (67-100%) 0  Tunneling: No 0 Undermining: No 0 Wound Description Classification: Full Thickness Without Exposed Support Structures Wound Margin: Distinct, outline attached Exudate Amount: None Present Foul Odor After Cleansing: No Slough/Fibrino No Wound Bed Granulation Amount: None Present (0%) Exposed Structure Necrotic Amount: None Present (0%) Fascia Exposed: No Fat Layer (Subcutaneous Tissue) Exposed: No Tendon Exposed: No Muscle Exposed: No Joint Exposed: No Bone Exposed: No Electronic Signature(s) Signed: 08/30/2021 4:57:16 PM By: [name] Pilling RN, BSN Entered By: [name] [name] on 08/30/2021 13:10:54 -------------------------------------------------------------------------------- Wound Assessment Details Patient Name: Date of Service: [name] [name] ID I. 08/30/2021 12:45 PM Medical Record Number: [id number] Patient Account Number: [id number] Date of Birth/Sex: Treating RN: 27-Aug-1926 (88 y.o. [name], [name] Primary Care Matilynn Dacey: [name] [name] Other Clinician: Referring Tresa Jolley: Treating Fadel Clason/Extender: [name] [name] in Treatment: 38 Wound Status Wound Number: 18 Primary Skin T ear Etiology: Wound Location: Right Upper Leg Wound Healed - Epithelialized Wounding Event: Trauma Status: Date Acquired: 08/14/2021 Comorbid Cataracts, Sleep Apnea, Arrhythmia, Congestive Heart Failure, Weeks Of Treatment: 2 History: Coronary Artery Disease, Hypertension, Myocardial Infarction, Clustered Wound: No End Stage Renal Disease, Gout, Dementia, Neuropathy Photos Wound Measurements Length: (cm) Width: (cm) Depth: (cm) Area: (cm) Volume: (cm) 0 % Reduction in Area: 100% 0 % Reduction in Volume: 100% 0 Epithelialization: Large (67-100%) 0 Tunneling: No 0 Undermining: No Wound Description Classification: Full Thickness Without Exposed Support Structures Wound Margin: Distinct, outline attached Exudate Amount: None Present Foul Odor After Cleansing:  No Slough/Fibrino No Wound Bed Granulation Amount: None Present (0%) Exposed Structure Necrotic Amount: None Present (0%) Fascia Exposed: No Fat Layer (Subcutaneous Tissue) Exposed: No Tendon Exposed: No Muscle Exposed: No Joint Exposed: No Bone Exposed: No Electronic Signature(s) Signed: 08/30/2021 4:57:16 PM By: [name] Pilling RN, BSN Entered By: [name] [name] on 08/30/2021 13:13:34 -------------------------------------------------------------------------------- Wound Assessment Details Patient Name: Date of Service: [name] [name] ID I. 08/30/2021 12:45 PM Medical Record Number: [id number] Patient Account Number: [id number] Date of Birth/Sex: Treating RN: 1926-10-11 (88 y.o. [name], [name] Primary Care Agripina Guyette: [name] [name] Other Clinician: Referring Lasasha Brophy: Treating Cayleb Jarnigan/Extender: [name] [name] in Treatment: 18 Wound Status Wound Number: 3 Primary Venous Leg Ulcer Etiology: Wound Location: Left, Lateral Ankle Wound Open Wounding Event: Gradually Appeared Status: Date Acquired: 10/12/2020 Comorbid Cataracts, Sleep Apnea, Arrhythmia, Congestive Heart Failure, Weeks Of Treatment: 41 History: Coronary Artery Disease, Hypertension, Myocardial Infarction, Clustered Wound: No End Stage Renal Disease, Gout, Dementia, Neuropathy Photos Wound Measurements Length: (cm) 1 Width: (cm) 0.7 Depth: (cm) 0.1 Area: (cm) 0.55 Volume: (cm) 0.055 % Reduction in Area: -9.3% % Reduction in Volume: 63.6% Epithelialization: Large (67-100%) Tunneling: No Undermining: No Wound Description Classification: Full Thickness Without Exposed Support Structures Wound Margin: Epibole Exudate Amount: Medium Exudate Type: Serosanguineous Exudate Color: red, brown Foul Odor After Cleansing: No Slough/Fibrino No Wound Bed Granulation Amount: Large (67-100%) Exposed Structure Granulation Quality: Red, Friable Fascia Exposed: No Necrotic Amount: None Present  (0%) Fat Layer (Subcutaneous Tissue) Exposed: Yes Tendon Exposed: No Muscle Exposed: No Joint Exposed: No Bone Exposed: No Treatment Notes Wound #3 (Ankle) Wound Laterality: Left, Lateral Cleanser Soap and Water Discharge Instruction: May shower and wash wound with dial antibacterial soap and water prior to dressing change. Peri-Wound Care Skin Prep Discharge Instruction: Use skin prep as directed Topical Primary Dressing Promogran Prisma Matrix, 4.34 (sq in) (silver collagen) Discharge Instruction: Moisten collagen with hydrogel or KY jelly. Secondary  Dressing Zetuvit Plus Silicone Border Dressing 4x4 (in/in) Discharge Instruction: Apply silicone border over primary dressing as directed. Secured With Compression Wrap Compression Stockings Environmental education officer) Signed: 08/30/2021 4:57:16 PM By: [name] Pilling RN, BSN Entered By: [name] [name] on 08/30/2021 13:12:34 -------------------------------------------------------------------------------- Wound Assessment Details Patient Name: Date of Service: [name] [name] ID I. 08/30/2021 12:45 PM Medical Record Number: [id number] Patient Account Number: [id number] Date of Birth/Sex: Treating RN: May 21, 1926 (88 y.o. [name], [name] Primary Care Malva Diesing: [name] [name] Other Clinician: Referring Kathrene Sinopoli: Treating Yeng Perz/Extender: [name] [name] in Treatment: 23 Wound Status Wound Number: 6 Primary Arterial Insufficiency Ulcer Etiology: Wound Location: Right, Lateral Ankle Wound Healed - Epithelialized Wounding Event: Gradually Appeared Status: Date Acquired: 11/22/2020 Comorbid Cataracts, Sleep Apnea, Arrhythmia, Congestive Heart Failure, Weeks Of Treatment: 36 History: Coronary Artery Disease, Hypertension, Myocardial Infarction, Clustered Wound: Yes End Stage Renal Disease, Gout, Dementia, Neuropathy Photos Wound Measurements Length: (cm) Width: (cm) Depth: (cm) Clustered  Quantity: Area: (cm) Volume: (cm) 0 % Reduction in Area: 100% 0 % Reduction in Volume: 100% 0 Epithelialization: Large (67-100%) 1 Tunneling: No 0 Undermining: No 0 Wound Description Classification: Full Thickness Without Exposed Support Structures Wound Margin: Distinct, outline attached Exudate Amount: None Present Foul Odor After Cleansing: No Slough/Fibrino No Wound Bed Granulation Amount: None Present (0%) Exposed Structure Necrotic Amount: None Present (0%) Fascia Exposed: No Fat Layer (Subcutaneous Tissue) Exposed: No Tendon Exposed: No Muscle Exposed: No Joint Exposed: No Bone Exposed: No Electronic Signature(s) Signed: 08/30/2021 4:57:16 PM By: [name] Pilling RN, BSN Entered By: [name] [name] on 08/30/2021 13:10:13 -------------------------------------------------------------------------------- Vitals Details Patient Name: Date of Service: [name] [name], [name] 08/30/2021 12:45 PM Medical Record Number: [id number] Patient Account Number: [id number] Date of Birth/Sex: Treating RN: 1926/11/26 (88 y.o. [name] [name] Primary Care Latashia Koch: [name] [name] Other Clinician: Referring Jishnu Jenniges: Treating Loveda Colaizzi/Extender: [name] [name] in Treatment: 60 Vital Signs Time Taken: 13:00 Temperature (F): 98 Height (in): 72 Pulse (bpm): 66 Weight (lbs): 223 Respiratory Rate (breaths/min): 20 Body Mass Index (BMI): 30.2 Blood Pressure (mmHg): 126/64 Reference Range: 80 - 120 mg / dl Electronic Signature(s) Signed: 08/30/2021 4:57:16 PM By: [name] Pilling RN, BSN Entered By: [name] [name] on 08/30/2021 13:00:51

## 2021-08-31 NOTE — Procedures (Signed)
Lumbosacral Transforaminal Epidural Steroid Injection - Sub-Pedicular Approach with Fluoroscopic Guidance  Patient: [name] [name]      Date of Birth: 10-Dec-1926 MRN: [id number] PCP: [name], MD      Visit Date: 08/23/2021   Universal Protocol:    Date/Time: 08/23/2021  Consent Given By: the patient  Position: PRONE  Additional Comments: Vital signs were monitored before and after the procedure. Patient was prepped and draped in the usual sterile fashion. The correct patient, procedure, and site was verified.   Injection Procedure Details:   Procedure diagnoses: Lumbar radiculopathy [M54.16]    Meds Administered:  Meds ordered this encounter  Medications   dexamethasone (DECADRON) injection 10 mg    Laterality: Bilateral  Location/Site: L4  Needle:5.0 in., 22 ga.  Short bevel or Quincke spinal needle  Needle Placement: Transforaminal  Findings:    -Comments: Excellent flow of contrast along the nerve, nerve root and into the epidural space.  Procedure Details: After squaring off the end-plates to get a true AP view, the C-arm was positioned so that an oblique view of the foramen as noted above was visualized. The target area is just inferior to the "nose of the scotty dog" or sub pedicular. The soft tissues overlying this structure were infiltrated with 2-3 ml. of 1% Lidocaine without Epinephrine.  The spinal needle was inserted toward the target using a "trajectory" view along the fluoroscope beam.  Under AP and lateral visualization, the needle was advanced so it did not puncture dura and was located close the 6 O'Clock position of the pedical in AP tracterory. Biplanar projections were used to confirm position. Aspiration was confirmed to be negative for CSF and/or blood. A 1-2 ml. volume of Isovue-250 was injected and flow of contrast was noted at each level. Radiographs were obtained for documentation purposes.   After attaining the desired flow of contrast  documented above, a 0.5 to 1.0 ml test dose of 0.25% Marcaine was injected into each respective transforaminal space.  The patient was observed for 90 seconds post injection.  After no sensory deficits were reported, and normal lower extremity motor function was noted,   the above injectate was administered so that equal amounts of the injectate were placed at each foramen (level) into the transforaminal epidural space.   Additional Comments:  We have a very difficult time transferring the patient from wheelchair to the table.  Dressing: 2 x 2 sterile gauze and Band-Aid    Post-procedure details: Patient was observed during the procedure. Post-procedure instructions were reviewed.  Patient left the clinic in stable condition.

## 2021-08-31 NOTE — Progress Notes (Signed)
[name] [name] - 88 y.o. male MRN [id number]  Date of birth: 1926/06/27  Office Visit Note: Visit Date: 08/23/2021 PCP: [name], MD Referred by: [name], MD  Subjective: Chief Complaint  Patient presents with   Lower Back - Pain   Right Leg - Pain   Left Leg - Pain   HPI:  [name] [name] is a 88 y.o. male who comes in today for planned repeat Bilateral L4-5  Lumbar Transforaminal epidural steroid injection with fluoroscopic guidance.  The patient has failed conservative care including home exercise, medications, time and activity modification.  This injection will be diagnostic and hopefully therapeutic.  Please see requesting physician notes for further details and justification. Patient received more than 50% pain relief from prior injection.   Referring: Barnet Pall, FNP, Suella Broad, MD  He was initially referred to [place] by Dr. Suella Broad because the patient had difficulty using his fluoroscopy table and also felt that using the surgery center was a inconvenience.  We were able to complete the last injection in our office.  Today we did have some difficulty transferring him before able to do so with a lot of help.  We did have to skin lesions appear as the patient just had a lot of weakness and we were trying to move him to the table.  We did address 1 of those with a bandage and it was superficial.  The other 1 was bleeding but small.  Both skin lacerations were really just uncovering of prior bruising ecchymosis probably from his blood thinner.  He is following with Dr. Alysia Penna now post stroke.  They may wish to see if Dr. Letta Pate could do the same injection either in their office or if they had a left.   ROS Otherwise per HPI.  Assessment & Plan: Visit Diagnoses:    ICD-10-CM   1. Lumbar radiculopathy  M54.16 XR C-ARM NO REPORT    Epidural Steroid injection    dexamethasone (DECADRON) injection 10 mg      Plan: No additional findings.   Meds &  Orders:  Meds ordered this encounter  Medications   dexamethasone (DECADRON) injection 10 mg    Orders Placed This Encounter  Procedures   XR C-ARM NO REPORT   Epidural Steroid injection    Follow-up: Return for visit to requesting provider as needed.   Procedures: No procedures performed  Lumbosacral Transforaminal Epidural Steroid Injection - Sub-Pedicular Approach with Fluoroscopic Guidance  Patient: [name] [name]      Date of Birth: 05/31/1926 MRN: [id number] PCP: [name], MD      Visit Date: 08/23/2021   Universal Protocol:    Date/Time: 08/23/2021  Consent Given By: the patient  Position: PRONE  Additional Comments: Vital signs were monitored before and after the procedure. Patient was prepped and draped in the usual sterile fashion. The correct patient, procedure, and site was verified.   Injection Procedure Details:   Procedure diagnoses: Lumbar radiculopathy [M54.16]    Meds Administered:  Meds ordered this encounter  Medications   dexamethasone (DECADRON) injection 10 mg    Laterality: Bilateral  Location/Site: L4  Needle:5.0 in., 22 ga.  Short bevel or Quincke spinal needle  Needle Placement: Transforaminal  Findings:    -Comments: Excellent flow of contrast along the nerve, nerve root and into the epidural space.  Procedure Details: After squaring off the end-plates to get a true AP view, the C-arm was positioned so that an oblique view of the  foramen as noted above was visualized. The target area is just inferior to the "nose of the scotty dog" or sub pedicular. The soft tissues overlying this structure were infiltrated with 2-3 ml. of 1% Lidocaine without Epinephrine.  The spinal needle was inserted toward the target using a "trajectory" view along the fluoroscope beam.  Under AP and lateral visualization, the needle was advanced so it did not puncture dura and was located close the 6 O'Clock position of the pedical in AP tracterory.  Biplanar projections were used to confirm position. Aspiration was confirmed to be negative for CSF and/or blood. A 1-2 ml. volume of Isovue-250 was injected and flow of contrast was noted at each level. Radiographs were obtained for documentation purposes.   After attaining the desired flow of contrast documented above, a 0.5 to 1.0 ml test dose of 0.25% Marcaine was injected into each respective transforaminal space.  The patient was observed for 90 seconds post injection.  After no sensory deficits were reported, and normal lower extremity motor function was noted,   the above injectate was administered so that equal amounts of the injectate were placed at each foramen (level) into the transforaminal epidural space.   Additional Comments:  We have a very difficult time transferring the patient from wheelchair to the table.  Dressing: 2 x 2 sterile gauze and Band-Aid    Post-procedure details: Patient was observed during the procedure. Post-procedure instructions were reviewed.  Patient left the clinic in stable condition.    Clinical History: CT LUMBAR SPINE WITHOUT CONTRAST   TECHNIQUE: Multidetector CT imaging of the lumbar spine was performed without intravenous contrast administration. Multiplanar CT image reconstructions were also generated.   RADIATION DOSE REDUCTION: This exam was performed according to the departmental dose-optimization program which includes automated exposure control, adjustment of the mA and/or kV according to patient size and/or use of iterative reconstruction technique.   COMPARISON:  08/17/2020   FINDINGS: Segmentation: 5 lumbar type vertebral bodies.   Alignment: Mild retropulsion of L1 fragments, unchanged since prior study. Otherwise normal alignments.   Vertebrae: Compression of the L1 vertebra with some retropulsion of fracture fragments. Previous kyphoplasty cement. Appearance is unchanged since the prior study. No new compression  deformities. Diffuse bone demineralization. No focal bone lesion or bone destruction. Visualized sacrum appears intact.   Paraspinal and other soft tissues: No abnormal paraspinal soft tissue mass or infiltration. Small bilateral pleural effusions are seen. Calcification of the aorta. Aortic aneurysm is demonstrated with maximal AP diameter of 4.3 cm. Similar appearance to previous study.   Disc levels: Degenerative disc space narrowing most prominent at L1-2 and L5-S1. Degenerative disc disease at L1-2. Endplate osteophyte formation throughout. There is evidence of moderate to severe central stenosis at T12-L1, L1-2, L2-3, L3-4, L4-5 levels. Degenerative changes in the facet joints.   IMPRESSION: 1. Old compression of L1 post kyphoplasty. Retropulsion of fracture fragments. No change in appearance since prior study. 2. No new acute fractures identified. 3. Diffuse multilevel degenerative changes. 4. 4.3 cm diameter abdominal aortic aneurysm is unchanged. Bilateral pleural effusions.     Electronically Signed   By: Lucienne Capers M.D.   On: 03/14/2021 23:26     Objective:  VS:  HT:    WT:   BMI:     BP:(!) 145/77  HR:73bpm  TEMP: ( )  RESP:  Physical Exam Vitals and nursing note reviewed.  Constitutional:      General: He is not in acute distress.    Appearance:  Normal appearance. He is not ill-appearing.  HENT:     Head: Normocephalic and atraumatic.     Right Ear: External ear normal.     Left Ear: External ear normal.     Nose: No congestion.  Eyes:     Extraocular Movements: Extraocular movements intact.  Cardiovascular:     Rate and Rhythm: Normal rate.     Pulses: Normal pulses.  Pulmonary:     Effort: Pulmonary effort is normal. No respiratory distress.  Abdominal:     General: There is no distension.     Palpations: Abdomen is soft.  Musculoskeletal:        General: Swelling present. No tenderness or signs of injury.     Cervical back: Neck  supple.     Right lower leg: No edema.     Left lower leg: No edema.     Comments: Patient very difficult to arise from seated position but can push himself up with his arms.  He has some spasticity lower extremity and weakness.  He has no pain with hip rotation.  Skin:    Findings: Bruising and lesion present. No erythema or rash.  Neurological:     General: No focal deficit present.     Mental Status: He is alert and oriented to person, place, and time.     Sensory: No sensory deficit.     Motor: Weakness present. No abnormal muscle tone.     Coordination: Coordination normal.     Gait: Gait abnormal.  Psychiatric:        Mood and Affect: Mood normal.        Behavior: Behavior normal.      Imaging: No results found.

## 2021-09-06 ENCOUNTER — Inpatient Hospital Stay: Payer: Medicare Other | Admitting: Neurology

## 2021-09-06 DIAGNOSIS — I63512 Cerebral infarction due to unspecified occlusion or stenosis of left middle cerebral artery: Secondary | ICD-10-CM | POA: Diagnosis not present

## 2021-09-06 DIAGNOSIS — I129 Hypertensive chronic kidney disease with stage 1 through stage 4 chronic kidney disease, or unspecified chronic kidney disease: Secondary | ICD-10-CM | POA: Diagnosis not present

## 2021-09-06 DIAGNOSIS — N1831 Chronic kidney disease, stage 3a: Secondary | ICD-10-CM | POA: Diagnosis not present

## 2021-09-06 DIAGNOSIS — M810 Age-related osteoporosis without current pathological fracture: Secondary | ICD-10-CM | POA: Diagnosis not present

## 2021-09-08 NOTE — Therapy (Signed)
OUTPATIENT PHYSICAL THERAPY NEURO TREATMENT   Patient Name: [name] [name] MRN: [id number] DOB:14-Feb-1926, 88 y.o., male Today's Date: 09/08/2021   PCP: [name], MD REFERRING PROVIDER: Charlett Blake, MD      Past Medical History:  Diagnosis Date   AAA (abdominal aortic aneurysm) [facility])    Arthritis    Ascending aorta dilatation ([place]) 03/13/2012   Fusiform dilatation of the ascending thoracic aorta discovered during surgery, max diameter 4.2-4.3 cm   BPH (benign prostatic hypertrophy)    Chronic systolic heart failure ([place]) 03/03/2012   Chronic venous insufficiency    GERD (gastroesophageal reflux disease)    Glaucoma    Gout    History of kidney stones    Hyperlipidemia    Hypertension    Obesity (BMI 30-39.9)    Paroxysmal atrial fibrillation ([place]) 02/29/2012   Recurrent paroxysmal, new-onset    PMR (polymyalgia rheumatica) (HCC)    Polymyalgia rheumatica ([place]) 02/06/2019   Pre-diabetes    S/P CABG x 3 03/06/2012   LIMA to LAD, SVG to OM, SVG to RCA, EVH via right thigh   S/P Maze operation for atrial fibrillation 03/06/2012   Complete bilateral lesions set using bipolar radiofrequency and cryothermy ablation with clipping of LA appendage   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   s/p TAVR with a 26 mm Edwards via the left subclavian by Drs Buena Irish and Bartle   Severe aortic stenosis 09/29/2019   Sleep apnea    USES CPAP NIGHTLY   Past Surgical History:  Procedure Laterality Date   ABDOMINAL AORTOGRAM W/LOWER EXTREMITY N/A 10/26/2020   Procedure: ABDOMINAL AORTOGRAM W/LOWER EXTREMITY;  Surgeon: Broadus John, MD;  Location: [facility];  Service: Cardiovascular;  Laterality: N/A;   CARDIAC CATHETERIZATION     CATARACT EXTRACTION, BILATERAL     with lens implants   CHOLECYSTECTOMY N/A 03/24/2012   Procedure: LAPAROSCOPIC CHOLECYSTECTOMY WITH INTRAOPERATIVE CHOLANGIOGRAM;  Surgeon: Zenovia Jarred, MD;  Location: [facility];   Service: General;  Laterality: N/A;   CORONARY ARTERY BYPASS GRAFT N/A 03/06/2012   Procedure: CORONARY ARTERY BYPASS GRAFTING (CABG);  Surgeon: Rexene Alberts, MD;  Location: [facility];  Service: Open Heart Surgery;  Laterality: N/A;   ENDOVEIN HARVEST OF GREATER SAPHENOUS VEIN Right 03/06/2012   Procedure: ENDOVEIN HARVEST OF GREATER SAPHENOUS VEIN;  Surgeon: Rexene Alberts, MD;  Location: [facility];  Service: Open Heart Surgery;  Laterality: Right;   INTRAOPERATIVE TRANSESOPHAGEAL ECHOCARDIOGRAM N/A 03/06/2012   Procedure: INTRAOPERATIVE TRANSESOPHAGEAL ECHOCARDIOGRAM;  Surgeon: Rexene Alberts, MD;  Location: [facility];  Service: Open Heart Surgery;  Laterality: N/A;   IR KYPHO LUMBAR INC FX REDUCE BONE BX UNI/BIL CANNULATION INC/IMAGING  02/23/2020   LEFT HEART CATH Right 03/02/2012   Procedure: LEFT HEART CATH;  Surgeon: Sherren Mocha, MD;  Location: [facility];  Service: Cardiovascular;  Laterality: Right;   MAZE N/A 03/06/2012   Procedure: MAZE;  Surgeon: Rexene Alberts, MD;  Location: [facility];  Service: Open Heart Surgery;  Laterality: N/A;   PERIPHERAL VASCULAR INTERVENTION Right 10/26/2020   Procedure: PERIPHERAL VASCULAR INTERVENTION;  Surgeon: Broadus John, MD;  Location: [facility];  Service: Cardiovascular;  Laterality: Right;   PROSTATECTOMY     partial   RIGHT/LEFT HEART CATH AND CORONARY/GRAFT ANGIOGRAPHY N/A 09/29/2019   Procedure: RIGHT/LEFT HEART CATH AND CORONARY/GRAFT ANGIOGRAPHY;  Surgeon: Adrian Prows, MD;  Location: [facility];  Service: Cardiovascular;  Laterality: N/A;   TEE WITHOUT CARDIOVERSION  N/A 11/10/2019   Procedure: TRANSESOPHAGEAL ECHOCARDIOGRAM (TEE);  Surgeon: Burnell Blanks, MD;  Location: [facility];  Service: Open Heart Surgery;  Laterality: N/A;   TRANSCATHETER AORTIC VALVE REPLACEMENT, TRANSFEMORAL  11/10/2019   Patient Active Problem List   Diagnosis Date Noted   Left middle cerebral artery stroke ([place]) 05/31/2021   Acute CVA (cerebrovascular  accident) ([place]) 05/25/2021   DNR (do not resuscitate) 05/25/2021   Osteoporosis 05/05/2021   Gouty arthritis 02/13/2021   Morbid obesity ([place]) 02/13/2021   Abdominal aortic aneurysm without rupture ([place]) 11/22/2020   Acute renal failure syndrome ([place]) 11/22/2020   Low back pain 11/22/2020   Skin tear of right upper arm without complication 16/10/9602   Pressure injury of skin 10/23/2020   Urinary tract infection with hematuria 10/22/2020   Encephalopathy 10/22/2020   AKI (acute kidney injury) ([place])    Overweight (BMI 25.0-29.9) 08/19/2020   Vertebral fracture, osteoporotic ([place]) 08/18/2020   T8 vertebral fracture ([place]) 08/17/2020   Thrombocytopenia ([place]) 08/17/2020   CKD (chronic kidney disease) stage 3, GFR 30-59 ml/min (HCC) 08/17/2020   Elevated troponin 08/17/2020   Pain due to onychomycosis of toenails of both feet 07/27/2020   Callus of heel 07/27/2020   Bifascicular block 11/11/2019   Acute on chronic diastolic heart failure ([place]) 11/10/2019   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   Severe aortic stenosis 09/29/2019   Aortic valve disorder 54/09/8117   Systolic heart failure ([place]) 03/11/2019   Thrombophilia ([place]) 02/26/2019   Muscle pain 02/11/2019   Weakness 02/06/2019   Polymyalgia rheumatica ([place]) 02/06/2019   Shoulder joint pain 02/02/2019   Anemia 01/26/2019   Malignant melanoma of skin ([place]) 01/26/2019   Pain in right leg 01/26/2019   DDD (degenerative disc disease), cervical 05/28/2018   Encounter for general adult medical examination without abnormal findings 11/19/2017   Carotid artery occlusion 07/03/2017   Hearing loss 07/03/2017   Fall 06/19/2017   Injury of upper extremity 06/19/2017   Neck pain 06/19/2017   Headache 06/19/2017   Visual disturbance 06/19/2017   Ganglion of hand 05/08/2017   Dilatation of aorta ([place]) 02/14/2017   Plantar fascial fibromatosis 02/14/2017   Cardiac murmur, unspecified 02/27/2016   Benign neoplasm  of colon 08/24/2015   Dementia ([place]) 08/24/2015   Influenza due to unidentified influenza virus with other respiratory manifestations 03/15/2015   Epistaxis 12/01/2014   OSA (obstructive sleep apnea) 11/18/2014   Gastro-esophageal reflux disease with esophagitis 10/29/2014   Angina pectoris ([place]) 05/04/2014   Abnormal feces 07/31/2013   Other specified disorders of gingiva and edentulous alveolar ridge 07/31/2013   Spontaneous ecchymosis 10/10/2012   Thoracic ascending aortic aneurysm ([place]) 09/29/2012   Microscopic hematuria 04/11/2012   Proteinuria 04/11/2012   S/P laparoscopic cholecystectomy 04/09/2012   S/P CABG x 3 03/06/2012   S/P Maze operation for atrial fibrillation 03/06/2012   Paroxysmal atrial fibrillation ([place]) 02/29/2012   Bitten or stung by nonvenomous insect and other nonvenomous arthropods, initial encounter 12/03/2011   Depression screening 10/17/2011   CAD (coronary artery disease), native coronary artery    Hyperlipidemia    Hypertension    Chronic venous insufficiency    Gout    Benign prostatic hyperplasia    Post-traumatic stress disorder 03/13/2011   Pain in left leg 12/08/2010   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   Fatigue 08/08/2010   Retention of urine 04/18/2010   Constipation 07/05/2009   Kidney stone 07/05/2009   ED (erectile dysfunction) of organic origin 12/14/2008   Impaired fasting  glucose 12/14/2008   Vitamin B deficiency 12/14/2008    ONSET DATE: 05/31/21  REFERRING DIAG: K53.976 (ICD-10-CM) - Left middle cerebral artery stroke (HCC)   THERAPY DIAG:  No diagnosis found.  Rationale for Evaluation and Treatment Rehabilitation  SUBJECTIVE:                                                                                                                                                                                              SUBJECTIVE STATEMENT: Suffered L1 compression fracture and back pain approx December 2021 or January 2022 and  had kyphoplasty and notes ongoing back pain and limited mobility since that time. Had left MCA CVA on 05/25/21 which exacerbated weakness and mobility deficits.   Pt accompanied by: self  PERTINENT HISTORY: medical history significant of AAA; CAD s/p CABG; chronic systolic CHF; afib s/p Maze, on Xarelto; HTN; HLD; PMR (currently on steroids); pre-diabetes; OSA on CPAP; PAD with lymphedema; and AS s/p TAVR   PAIN:  Are you having pain? Yes: NPRS scale: 2/10 Pain location: lumbar spine Pain description: aching, sore, sharp Aggravating factors: standing/walking Relieving factors: recliner  PRECAUTIONS: Fall, pressure sores bilateral calcaneus right > left  PATIENT GOALS be able to stand unsupported for a time to perform ADL, walking if possible  OBJECTIVE:    TODAY'S TREATMENT: 09/11/21 Activity Comments                        Below measures were taken at time of initial evaluation unless otherwise specified:   DIAGNOSTIC FINDINGS: IMPRESSION: Small areas of acute infarction in the right occipital lobe and left frontal lobe over the convexity. Mild to moderate acute infarct left operculum. Possible cerebral emboli.   Atrophy and mild chronic microvascular ischemia  COGNITION: Overall cognitive status: Within functional limits for tasks assessed Patient's historical recall is not very accurate, he reports his back injury presented x 9 months ago, whereas medical record notes encounter in 12/2019-01/2020 regarding L1 compression fx   SENSATION: Not tested  COORDINATION: Alt. Foot taps impaired due to dorsiflexion weakness  EDEMA:  Bilateral LE, wearing compression stockings. Uses lymphedema pump  MUSCLE TONE: DNT    POSTURE: rounded shoulders  LOWER EXTREMITY ROM:     Passive  Right Eval Left Eval  Hip flexion    Hip extension    Hip abduction    Hip adduction    Hip internal rotation    Hip external rotation    Knee flexion    Knee extension     Ankle dorsiflexion -10 -10  Ankle plantarflexion    Ankle inversion  Ankle eversion     (Blank rows = not tested)  LOWER EXTREMITY MMT:    MMT Right Eval Left Eval  Hip flexion 3 3  Hip extension    Hip abduction 2+ 2+  Hip adduction 3 3  Hip internal rotation    Hip external rotation    Knee flexion 3+ 3+  Knee extension 4- 4-  Ankle dorsiflexion 0 0  Ankle plantarflexion    Ankle inversion    Ankle eversion    (Blank rows = not tested)  BED MOBILITY:  Sit to supine Min A and Mod A Supine to sit Min A Pt notes his bed at home has assist rails  TRANSFERS: Assistive device utilized: Wheelchair (manual)  Sit to stand: CGA and Min A Stand to sit: CGA and Min A Chair to chair: CGA and Min A Floor:  DNT   W/C mobility Independent level surfaces  STAIRS:  DNT--pt has elevator to enter/exit home  GAIT: DNT due to safety concerns  FUNCTIONAL TESTs:  5 times sit to stand: unable, requires BUE and assist Static standing: dependent on BUE via RW (supported standing) Straight Leg Raise: 20+ degrees extensor lag  PATIENT SURVEYS:  FOTO to be assessed     PATIENT EDUCATION: Education details: assessment findings Person educated: Patient Education method: Explanation Education comprehension: verbalized understanding   HOME EXERCISE PROGRAM: To be initiated, demonstration/rehearsal of hip add isometric    GOALS: Goals reviewed with patient? Yes  SHORT TERM GOALS: Target date: 09/26/2021  Patient will be independent in HEP to improve functional outcomes Baseline: Goal status: IN PROGRESS  2.  Patient to demonstrate sit to supine w/ supervision using adaptive equipment to reduce level of assist from caregiver Baseline: Min-mod A for BLE to clear EOB Goal status: IN PROGRESS    LONG TERM GOALS: Target date: 10/24/2021  Demonstrate improved hip abduction strength to 3+/5 to improve standing stability Baseline: 2+/5 Goal status: IN PROGRESS  2.   Manifest improved BLE strength as evidenced by ability to perform 5xSTS test in 30 sec using BUE Baseline: unable Goal status: IN PROGRESS  3.  Demonstrate static standing balance of Fair (no UE support) x 15 sec to improve safety with ADL performance Baseline: unable, dependent on BUE (Fair-) Goal status: IN PROGRESS  4.  Demonstrate transfers w/ set-up assist to increase independence and safety with mobility Baseline: CGA-min A Goal status: IN PROGRESS   ASSESSMENT:  CLINICAL IMPRESSION: Patient is a 88 y.o. male who was seen today for physical therapy evaluation and treatment for generalized weakness and mobility deficits.  Pt with multiple co-morbid issues impacting his functional mobility and activity tolerance indicating need for PT services to intervene for training in restorative, adaptive, and compensatory strategies to foster and facilitate greater functional independence and mobility to improve quality of life and reduce burden of care.    OBJECTIVE IMPAIRMENTS cardiopulmonary status limiting activity, decreased activity tolerance, decreased balance, decreased knowledge of use of DME, decreased mobility, difficulty walking, decreased ROM, decreased strength, impaired perceived functional ability, and pain.   ACTIVITY LIMITATIONS lifting, bending, standing, squatting, transfers, bed mobility, toileting, dressing, and locomotion level  PARTICIPATION LIMITATIONS: meal prep, cleaning, community activity, yard work, and home mgmt  PERSONAL FACTORS Age, Time since onset of injury/illness/exacerbation, and 3+ comorbidities: see PMH  are also affecting patient's functional outcome.   REHAB POTENTIAL: Good  CLINICAL DECISION MAKING: Evolving/moderate complexity  EVALUATION COMPLEXITY: Moderate  PLAN: PT FREQUENCY: 2x/week  PT DURATION: 8 weeks  PLANNED INTERVENTIONS: Therapeutic exercises, Therapeutic activity, Neuromuscular re-education, Balance training, Gait training,  Patient/Family education, Self Care, Joint mobilization, Stair training, Vestibular training, Canalith repositioning, Orthotic/Fit training, DME instructions, Aquatic Therapy, Dry Needling, Electrical stimulation, Wheelchair mobility training, Spinal mobilization, Cryotherapy, Moist heat, Taping, Ionotophoresis '4mg'$ /ml Dexamethasone, and Manual therapy  PLAN FOR NEXT SESSION: bed mobility with leg lifter, bed-based HEP for hip/LE strength   Janene Harvey, PT, DPT 09/08/21 12:08 PM  [facility] at [facility] [address], [place], [place] [address] Phone # [phone] Fax # [phone]

## 2021-09-11 ENCOUNTER — Ambulatory Visit: Payer: Medicare Other | Admitting: Physical Therapy

## 2021-09-11 ENCOUNTER — Ambulatory Visit: Payer: Medicare Other | Attending: Physical Medicine & Rehabilitation | Admitting: Occupational Therapy

## 2021-09-11 ENCOUNTER — Encounter: Payer: Self-pay | Admitting: Physical Therapy

## 2021-09-11 DIAGNOSIS — I69351 Hemiplegia and hemiparesis following cerebral infarction affecting right dominant side: Secondary | ICD-10-CM | POA: Diagnosis not present

## 2021-09-11 DIAGNOSIS — R262 Difficulty in walking, not elsewhere classified: Secondary | ICD-10-CM

## 2021-09-11 DIAGNOSIS — M6281 Muscle weakness (generalized): Secondary | ICD-10-CM

## 2021-09-11 DIAGNOSIS — R41840 Attention and concentration deficit: Secondary | ICD-10-CM | POA: Diagnosis not present

## 2021-09-11 DIAGNOSIS — R41841 Cognitive communication deficit: Secondary | ICD-10-CM | POA: Insufficient documentation

## 2021-09-11 DIAGNOSIS — R2681 Unsteadiness on feet: Secondary | ICD-10-CM

## 2021-09-11 DIAGNOSIS — R2689 Other abnormalities of gait and mobility: Secondary | ICD-10-CM

## 2021-09-11 DIAGNOSIS — R4701 Aphasia: Secondary | ICD-10-CM | POA: Diagnosis not present

## 2021-09-11 DIAGNOSIS — R208 Other disturbances of skin sensation: Secondary | ICD-10-CM | POA: Diagnosis not present

## 2021-09-11 NOTE — Therapy (Signed)
OUTPATIENT OCCUPATIONAL THERAPY  Treatment Note  Patient Name: [name] [name] MRN: [id number] DOB:19-Feb-1926, 88 y.o., male Today's Date: 09/11/2021  PCP: [name], MD REFERRING PROVIDER: Charlett Blake, MD    OT End of Session - 09/11/21 1404     Visit Number 2    Number of Visits 17    Date for OT Re-Evaluation 10/27/21    Authorization Type BCBS Medicare    OT Start Time 1404    OT Stop Time 1446    OT Time Calculation (min) 42 min    Activity Tolerance Patient tolerated treatment well    Behavior During Therapy WFL for tasks assessed/performed              Past Medical History:  Diagnosis Date   AAA (abdominal aortic aneurysm) ([place])    Arthritis    Ascending aorta dilatation ([place]) 03/13/2012   Fusiform dilatation of the ascending thoracic aorta discovered during surgery, max diameter 4.2-4.3 cm   BPH (benign prostatic hypertrophy)    Chronic systolic heart failure ([place]) 03/03/2012   Chronic venous insufficiency    GERD (gastroesophageal reflux disease)    Glaucoma    Gout    History of kidney stones    Hyperlipidemia    Hypertension    Obesity (BMI 30-39.9)    Paroxysmal atrial fibrillation ([place]) 02/29/2012   Recurrent paroxysmal, new-onset    PMR (polymyalgia rheumatica) (HCC)    Polymyalgia rheumatica ([place]) 02/06/2019   Pre-diabetes    S/P CABG x 3 03/06/2012   LIMA to LAD, SVG to OM, SVG to RCA, EVH via right thigh   S/P Maze operation for atrial fibrillation 03/06/2012   Complete bilateral lesions set using bipolar radiofrequency and cryothermy ablation with clipping of LA appendage   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   s/p TAVR with a 26 mm Edwards via the left subclavian by Drs Buena Irish and Bartle   Severe aortic stenosis 09/29/2019   Sleep apnea    USES CPAP NIGHTLY   Past Surgical History:  Procedure Laterality Date   ABDOMINAL AORTOGRAM W/LOWER EXTREMITY N/A 10/26/2020   Procedure: ABDOMINAL AORTOGRAM  W/LOWER EXTREMITY;  Surgeon: Broadus John, MD;  Location: [facility];  Service: Cardiovascular;  Laterality: N/A;   CARDIAC CATHETERIZATION     CATARACT EXTRACTION, BILATERAL     with lens implants   CHOLECYSTECTOMY N/A 03/24/2012   Procedure: LAPAROSCOPIC CHOLECYSTECTOMY WITH INTRAOPERATIVE CHOLANGIOGRAM;  Surgeon: Zenovia Jarred, MD;  Location: [facility];  Service: General;  Laterality: N/A;   CORONARY ARTERY BYPASS GRAFT N/A 03/06/2012   Procedure: CORONARY ARTERY BYPASS GRAFTING (CABG);  Surgeon: Rexene Alberts, MD;  Location: [facility];  Service: Open Heart Surgery;  Laterality: N/A;   ENDOVEIN HARVEST OF GREATER SAPHENOUS VEIN Right 03/06/2012   Procedure: ENDOVEIN HARVEST OF GREATER SAPHENOUS VEIN;  Surgeon: Rexene Alberts, MD;  Location: [facility];  Service: Open Heart Surgery;  Laterality: Right;   INTRAOPERATIVE TRANSESOPHAGEAL ECHOCARDIOGRAM N/A 03/06/2012   Procedure: INTRAOPERATIVE TRANSESOPHAGEAL ECHOCARDIOGRAM;  Surgeon: Rexene Alberts, MD;  Location: [facility];  Service: Open Heart Surgery;  Laterality: N/A;   IR KYPHO LUMBAR INC FX REDUCE BONE BX UNI/BIL CANNULATION INC/IMAGING  02/23/2020   LEFT HEART CATH Right 03/02/2012   Procedure: LEFT HEART CATH;  Surgeon: Sherren Mocha, MD;  Location: [facility];  Service: Cardiovascular;  Laterality: Right;   MAZE N/A 03/06/2012   Procedure: MAZE;  Surgeon: Rexene Alberts, MD;  Location: [facility];  Service: Open Heart Surgery;  Laterality: N/A;   PERIPHERAL VASCULAR INTERVENTION Right 10/26/2020   Procedure: PERIPHERAL VASCULAR INTERVENTION;  Surgeon: Broadus John, MD;  Location: [facility];  Service: Cardiovascular;  Laterality: Right;   PROSTATECTOMY     partial   RIGHT/LEFT HEART CATH AND CORONARY/GRAFT ANGIOGRAPHY N/A 09/29/2019   Procedure: RIGHT/LEFT HEART CATH AND CORONARY/GRAFT ANGIOGRAPHY;  Surgeon: Adrian Prows, MD;  Location: [facility];  Service: Cardiovascular;  Laterality: N/A;   TEE WITHOUT CARDIOVERSION N/A  11/10/2019   Procedure: TRANSESOPHAGEAL ECHOCARDIOGRAM (TEE);  Surgeon: Burnell Blanks, MD;  Location: [place] [place];  Service: Open Heart Surgery;  Laterality: N/A;   TRANSCATHETER AORTIC VALVE REPLACEMENT, TRANSFEMORAL  11/10/2019   Patient Active Problem List   Diagnosis Date Noted   Left middle cerebral artery stroke ([place]) 05/31/2021   Acute CVA (cerebrovascular accident) ([place]) 05/25/2021   DNR (do not resuscitate) 05/25/2021   Osteoporosis 05/05/2021   Gouty arthritis 02/13/2021   Morbid obesity ([place]) 02/13/2021   Abdominal aortic aneurysm without rupture ([place]) 11/22/2020   Acute renal failure syndrome ([place]) 11/22/2020   Low back pain 11/22/2020   Skin tear of right upper arm without complication 01/74/9449   Pressure injury of skin 10/23/2020   Urinary tract infection with hematuria 10/22/2020   Encephalopathy 10/22/2020   AKI (acute kidney injury) ([place])    Overweight (BMI 25.0-29.9) 08/19/2020   Vertebral fracture, osteoporotic ([place]) 08/18/2020   T8 vertebral fracture ([place]) 08/17/2020   Thrombocytopenia ([place]) 08/17/2020   CKD (chronic kidney disease) stage 3, GFR 30-59 ml/min (HCC) 08/17/2020   Elevated troponin 08/17/2020   Pain due to onychomycosis of toenails of both feet 07/27/2020   Callus of heel 07/27/2020   Bifascicular block 11/11/2019   Acute on chronic diastolic heart failure ([place]) 11/10/2019   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   Severe aortic stenosis 09/29/2019   Aortic valve disorder 67/59/1638   Systolic heart failure ([place]) 03/11/2019   Thrombophilia ([place]) 02/26/2019   Muscle pain 02/11/2019   Weakness 02/06/2019   Polymyalgia rheumatica ([place]) 02/06/2019   Shoulder joint pain 02/02/2019   Anemia 01/26/2019   Malignant melanoma of skin ([place]) 01/26/2019   Pain in right leg 01/26/2019   DDD (degenerative disc disease), cervical 05/28/2018   Encounter for general adult medical examination without abnormal findings 11/19/2017    Carotid artery occlusion 07/03/2017   Hearing loss 07/03/2017   Fall 06/19/2017   Injury of upper extremity 06/19/2017   Neck pain 06/19/2017   Headache 06/19/2017   Visual disturbance 06/19/2017   Ganglion of hand 05/08/2017   Dilatation of aorta ([place]) 02/14/2017   Plantar fascial fibromatosis 02/14/2017   Cardiac murmur, unspecified 02/27/2016   Benign neoplasm of colon 08/24/2015   Dementia ([place]) 08/24/2015   Influenza due to unidentified influenza virus with other respiratory manifestations 03/15/2015   Epistaxis 12/01/2014   OSA (obstructive sleep apnea) 11/18/2014   Gastro-esophageal reflux disease with esophagitis 10/29/2014   Angina pectoris ([place]) 05/04/2014   Abnormal feces 07/31/2013   Other specified disorders of gingiva and edentulous alveolar ridge 07/31/2013   Spontaneous ecchymosis 10/10/2012   Thoracic ascending aortic aneurysm ([place]) 09/29/2012   Microscopic hematuria 04/11/2012   Proteinuria 04/11/2012   S/P laparoscopic cholecystectomy 04/09/2012   S/P CABG x 3 03/06/2012   S/P Maze operation for atrial fibrillation 03/06/2012   Paroxysmal atrial fibrillation ([place]) 02/29/2012   Bitten or stung by nonvenomous insect and other nonvenomous arthropods, initial encounter 12/03/2011  Depression screening 10/17/2011   CAD (coronary artery disease), native coronary artery    Hyperlipidemia    Hypertension    Chronic venous insufficiency    Gout    Benign prostatic hyperplasia    Post-traumatic stress disorder 03/13/2011   Pain in left leg 12/08/2010   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   Fatigue 08/08/2010   Retention of urine 04/18/2010   Constipation 07/05/2009   Kidney stone 07/05/2009   ED (erectile dysfunction) of organic origin 12/14/2008   Impaired fasting glucose 12/14/2008   Vitamin B deficiency 12/14/2008    ONSET DATE: 05/25/2021  REFERRING DIAG: D42.876 (ICD-10-CM) - Left middle cerebral artery stroke   THERAPY DIAG:  Hemiplegia and  hemiparesis following cerebral infarction affecting right dominant side (HCC)  Other disturbances of skin sensation  Attention and concentration deficit  Muscle weakness (generalized)  Rationale for Evaluation and Treatment Rehabilitation  SUBJECTIVE:   SUBJECTIVE STATEMENT: Pt reports receiving a shot for his back since evaluation with some relief in pain, but having a lot of pain today.    Pt accompanied by: self and significant other (dropped off pt)  PERTINENT HISTORY:  H/o AAA, CAD with CABG, compression fractures, chronic anemia followed by hematology services, chronic systolic congestive heart failure, A-fib/AF status post MAZE on Xarelto, hypertension, hyperlipidemia, PMR maintained on steroids, prediabetes, early dementia maintained on Namenda, chronic lower extremity wounds with Unna boot OSA with CPAP, PAD with lymphedema status post balloon angioplasty of posterior tibial artery October 2022 recent UTI placed on antibiotic therapy.  He does have a personal care attendant Monday Wednesday Friday for 3 hours.  Modified independent transfers power wheelchair bound x3 to 4 months prior to that ambulating short distances with a rollator.  Present 05/25/2021 with right-sided weakness and aphasia of acute onset.  Cranial CT scan showed small left MCA territory cortical infarct within the mid to posterior left frontal lobe.  CT angiogram head and neck no large vessel occlusion.   PRECAUTIONS: Fall  WEIGHT BEARING RESTRICTIONS No  PAIN:  Are you having pain? Yes: NPRS scale: 6/10 Pain location: mid back Pain description: constant, dull, ache Aggravating factors: standing Relieving factors: injections, repositioning  FALLS: Has patient fallen in last 6 months? Yes. Number of falls 4, pt reports that he hasn't fallen in the last few months  LIVING ENVIRONMENT: Lives with: lives with their spouse Lives in: House/apartment Stairs:  Yes: steps on front and back of house, however has  an elevator at the back, steps to 2nd floor but does not need to go upstairs Has following equipment at home: Environmental consultant - 2 wheeled, Environmental consultant - 4 wheeled, Wheelchair (power), Wheelchair (manual), shower chair, bed side commode, Grab bars, and elevator to access home from outside, hand held shower head  PLOF:  Has a caregiver 3x/week that assists with ADLs, d/t spinal compression fx pt has become more w/c bound in 5-6 months due to    [place] to talk properly; to write and to spell  OBJECTIVE:   HAND DOMINANCE: Right  ADLs: Transfers/ambulation related to ADLs: Mod I - supervision stand pivot transfers from w/c Eating: reports great difficulty using a knife when cutting food, wife frequently cuts foods for him Grooming: Mod I from seated position UB Dressing: Setup LB Dressing: wife assists with pants, socks, shoes Toileting: Pt with occasional difficulty pulling pants back up after toileting, leans forward to complete hygiene Bathing: Supervision/setup  Tub Shower transfers: caregiver assisting with transfers in/out of shower providing supervision and occasional min  assist Equipment: Shower seat with back, Grab bars, Walk in shower, bed side commode, Reacher, and Sock aid   IADLs: Medication management: Mod I Financial management: Pt reports difficulty with writing a check, math, spelling, but did most of financial management Handwriting: 50% legible and difficulty with spelling that has gotten worse since CVA  MOBILITY STATUS: Needs Assist: Mod I stand pivot from w/c, Supervision for shower transfers  POSTURE COMMENTS:  rounded shoulders and forward head  ACTIVITY TOLERANCE: Activity tolerance: WFL for tasks assessed during eval  FUNCTIONAL OUTCOME MEASURES: FOTO: TBD at next session  UPPER EXTREMITY ROM     Active ROM Right eval Left eval  Shoulder flexion 118 115  Shoulder abduction [facility]  Shoulder adduction    Shoulder extension    Shoulder internal rotation     Shoulder external rotation    Elbow flexion WFL WFL  Elbow extension [facility] WFL  Wrist flexion    Wrist extension    Wrist ulnar deviation    Wrist radial deviation    Wrist pronation    Wrist supination    (Blank rows = not tested)   UPPER EXTREMITY MMT:     MMT Right eval Left eval  Shoulder flexion 4+/5 4+/5  Shoulder abduction    Shoulder adduction    Shoulder extension    Shoulder internal rotation    Shoulder external rotation    Middle trapezius    Lower trapezius    Elbow flexion 4+/5 4+/5  Elbow extension 4+/5 4+/5  Wrist flexion    Wrist extension    Wrist ulnar deviation    Wrist radial deviation    Wrist pronation    Wrist supination    (Blank rows = not tested)  HAND FUNCTION: Grip strength: Right: 30 lbs; Left: 30 lbs  COORDINATION: 9 Hole Peg test: Right: 1:06.53 sec; Left: 34.53 sec  SENSATION: Reports numbness and difficulty maintaining grasp on spoon/fork and writing utensil -------------------------------------------------------------------------------------------------------------------------------------------------------- (objective measures above completed at initial evaluation unless otherwise dated)   TODAY'S TREATMENT:  Applied heat: moist heat applied to mid-low back for pain relief while engaging in table top tasks.  Pt reports mild decrease in pain from 6/10 to 5/10 post heat. Theraputty: thumb opposition for improved pinch strength to increase grasp and manipulation on writing utensil and fork/spoon/knife. Key pinch for improved grasp and manipulation for improved use of utensils (both writing and eating). Tip pinch with putty remove a small piece of putty and then roll in between thumb, index, and long finger to make small ball to facilitate increased coordination and manipulation as needed for utensil use. Pinch strength to remove hidden marbles with BUE and then tip pinch to remove with RUE.   PATIENT EDUCATION: Education details:  educated on theraputty exercises and functional implications with coordination and strengthening as needed for utensil use (writing and eating utensils) Person educated: Patient Education method: Explanation, Demonstration, and Handouts Education comprehension: verbalized understanding   HOME EXERCISE PROGRAM: Access Code: [email or web address] URL: [email or web address] Date: 09/11/2021 Prepared by: [facility]  Exercises - Putty Squeezes  - 1 x daily - 1 sets - 10 reps - Rolling Putty on Table  - 1 x daily - 1 sets - 10 reps - Thumb Opposition with Putty  - 1 x daily - 1 sets - 10 reps - Key Pinch with Putty  - 1 x daily - 1 sets - 10 reps - Tip Pinch with Putty  - 1 x  daily - 1 sets - 10 reps - Removing Marbles from Putty  - 1 x daily - 1 sets - 10 reps    GOALS: Goals reviewed with patient? Yes  SHORT TERM GOALS: Target date: 09/29/2021  Pt will verbalize understanding of adapted strategies and/or potential AE needs to increase ease, safety, and independence w/ LB dressing. Baseline: Goal status: IN PROGRESS  2.  Pt will be independent with FMC/GMC and strengthening HEP to increase independence with handwriting and cutting of foods. Baseline:  Goal status: IN PROGRESS  3. Pt will demonstrate improved fine motor coordination for ADLs as evidenced by decreasing 9 hole peg test score for RUE by 5 secs  Baseline: 9 Hole Peg test: Right: 1:06.53 sec; Left: 34.53 sec  Goal status: IN PROGRESS   LONG TERM GOALS: Target date: 10/27/2021  Pt will report ability to complete LB dressing with supervision for standing balance and use of AE as needed. Baseline:  Goal status: IN PROGRESS  2.  Pt will demonstrated improved standing balance to stand for 1 min with alternating UE to allow for increased independence with LB dressing/clothing management post toileting. Baseline:  Goal status: IN PROGRESS  3.  Pt will demonstrated improved working  memory and recall during check writing/balancing check book by making 0 errors. Baseline:  Goal status: IN PROGRESS  4.  Pt will demonstrate improved fine motor coordination for ADLs as evidenced by decreasing 9 hole peg test score for RUE by 10 secs Baseline: 9 Hole Peg test: Right: 1:06.53 sec; Left: 34.53 sec Goal status: IN PROGRESS  5.  Pt will write a short paragraph with 90% legibility and no significant errors in spelling as evidenced by </= to 3 spelling errors. Baseline:  Goal status: IN PROGRESS   ASSESSMENT:  CLINICAL IMPRESSION: Pt seen for first treatment session s/p initial evaluation.  Therapist reviewed established goals and reiterated purpose of OT, pt in agreement. Pt demonstrating decreased strength and coordination with theraputty activities this session, benefiting from demonstration and multimodal cues for improved technique - especially with tip pinch and rotation to make balls in finger tips. Educated on functional carryover to grasp and manipulation of both writing and eating utensils.  PERFORMANCE DEFICITS in functional skills including ADLs, IADLs, coordination, sensation, ROM, strength, pain, FMC, GMC, balance, body mechanics, endurance, decreased knowledge of use of DME, and UE functional use, cognitive skills including attention, problem solving, sequencing, and thought, and psychosocial skills including coping strategies, environmental adaptation, and routines and behaviors.   IMPAIRMENTS are limiting patient from ADLs and IADLs.   COMORBIDITIES may have co-morbidities  that affects occupational performance. Patient will benefit from skilled OT to address above impairments and improve overall function.  MODIFICATION OR ASSISTANCE TO COMPLETE EVALUATION: Min-Moderate modification of tasks or assist with assess necessary to complete an evaluation.  OT OCCUPATIONAL PROFILE AND HISTORY: Detailed assessment: Review of records and additional review of physical,  cognitive, psychosocial history related to current functional performance.  CLINICAL DECISION MAKING: Moderate - several treatment options, min-mod task modification necessary  REHAB POTENTIAL: Good  EVALUATION COMPLEXITY: Moderate    PLAN: OT FREQUENCY: 2x/week  OT DURATION: 8 weeks  PLANNED INTERVENTIONS: self care/ADL training, therapeutic exercise, therapeutic activity, neuromuscular re-education, balance training, functional mobility training, moist heat, cryotherapy, patient/family education, cognitive remediation/compensation, psychosocial skills training, energy conservation, coping strategies training, and DME and/or AE instructions  RECOMMENDED OTHER SERVICES: N/A  CONSULTED AND AGREED WITH PLAN OF CARE: Patient  PLAN FOR NEXT SESSION: initiate coordination HEP,  engage in check book activity to address handwriting, spelling, and mental math   [place], [place], OTR/L 09/11/2021, 2:07 PM

## 2021-09-12 ENCOUNTER — Inpatient Hospital Stay (HOSPITAL_BASED_OUTPATIENT_CLINIC_OR_DEPARTMENT_OTHER): Payer: Medicare Other | Admitting: Oncology

## 2021-09-12 ENCOUNTER — Inpatient Hospital Stay: Payer: Medicare Other | Attending: Oncology

## 2021-09-12 ENCOUNTER — Other Ambulatory Visit: Payer: Self-pay

## 2021-09-12 ENCOUNTER — Inpatient Hospital Stay: Payer: Medicare Other

## 2021-09-12 VITALS — BP 96/58 | HR 61 | Temp 98.1°F | Resp 17 | Ht 72.0 in

## 2021-09-12 DIAGNOSIS — D649 Anemia, unspecified: Secondary | ICD-10-CM

## 2021-09-12 DIAGNOSIS — I129 Hypertensive chronic kidney disease with stage 1 through stage 4 chronic kidney disease, or unspecified chronic kidney disease: Secondary | ICD-10-CM | POA: Diagnosis not present

## 2021-09-12 DIAGNOSIS — N189 Chronic kidney disease, unspecified: Secondary | ICD-10-CM | POA: Diagnosis not present

## 2021-09-12 DIAGNOSIS — D631 Anemia in chronic kidney disease: Secondary | ICD-10-CM | POA: Diagnosis not present

## 2021-09-12 DIAGNOSIS — Z79899 Other long term (current) drug therapy: Secondary | ICD-10-CM | POA: Diagnosis not present

## 2021-09-12 DIAGNOSIS — D696 Thrombocytopenia, unspecified: Secondary | ICD-10-CM | POA: Diagnosis not present

## 2021-09-12 LAB — CBC WITH DIFFERENTIAL (CANCER CENTER ONLY)
Abs Immature Granulocytes: 0.02 10*3/uL (ref 0.00–0.07)
Basophils Absolute: 0 10*3/uL (ref 0.0–0.1)
Basophils Relative: 1 %
Eosinophils Absolute: 0.2 10*3/uL (ref 0.0–0.5)
Eosinophils Relative: 5 %
HCT: 26.8 % — ABNORMAL LOW (ref 39.0–52.0)
Hemoglobin: 8.4 g/dL — ABNORMAL LOW (ref 13.0–17.0)
Immature Granulocytes: 1 %
Lymphocytes Relative: 21 %
Lymphs Abs: 0.8 10*3/uL (ref 0.7–4.0)
MCH: 28.8 pg (ref 26.0–34.0)
MCHC: 31.3 g/dL (ref 30.0–36.0)
MCV: 91.8 fL (ref 80.0–100.0)
Monocytes Absolute: 0.5 10*3/uL (ref 0.1–1.0)
Monocytes Relative: 12 %
Neutro Abs: 2.4 10*3/uL (ref 1.7–7.7)
Neutrophils Relative %: 60 %
Platelet Count: 102 10*3/uL — ABNORMAL LOW (ref 150–400)
RBC: 2.92 MIL/uL — ABNORMAL LOW (ref 4.22–5.81)
RDW: 17.1 % — ABNORMAL HIGH (ref 11.5–15.5)
WBC Count: 3.9 10*3/uL — ABNORMAL LOW (ref 4.0–10.5)
nRBC: 0 % (ref 0.0–0.2)

## 2021-09-12 LAB — SAMPLE TO BLOOD BANK

## 2021-09-12 MED ORDER — EPOETIN ALFA-EPBX 20000 UNIT/ML IJ SOLN
20000.0000 [IU] | Freq: Once | INTRAMUSCULAR | Status: AC
  Administered 2021-09-12: 20000 [IU] via SUBCUTANEOUS
  Filled 2021-09-12: qty 1

## 2021-09-12 NOTE — Progress Notes (Signed)
Hematology and Oncology Follow Up Visit  [name] [name] [id number] 1926/12/31 88 y.o. 09/12/2021 8:23 AM [name], MDPerini, Elta Guadeloupe, MD   Principle Diagnosis: 88 year old with anemia related to chronic kidney disease and iron deficiency diagnosed in April 2023.  His work-up did not reveal any plasma cell disorder creatinine clearance of 32 cc/min.     Current therapy: Retacrit 20,000 units started on May 04, 2021.  He is supposed to receive it every 3 weeks.  He presents today for his third injection.  Interim History: [name] [name] returns today for a follow-up visit.  Since the last visit, he was hospitalized in June 2023 for stroke and currently recovering.  He was started on Retacrit although his treatment was interrupted till August 2023.  Clinically, he is recovering from his hospitalization and recent stroke and currently participating in rehab.  He denies any shortness of breath or difficulty breathing but has reported generalized fatigue and weakness.  He denies any hematochezia or melena.     Medications: I have reviewed the patient's current medications.  Current Outpatient Medications  Medication Sig Dispense Refill   acetaminophen (TYLENOL) 325 MG tablet Take 2 tablets (650 mg total) by mouth every 4 (four) hours as needed for mild pain (or temp > 37.5 C (99.5 F)).     Acetaminophen 500 MG capsule 2-3 capsules as needed Orally every 6 hrs     amiodarone (PACERONE) 200 MG tablet Take 1 tablet (200 mg total) by mouth daily. 30 tablet 0   amLODipine (NORVASC) 10 MG tablet 1 tablet Orally Once a day     apixaban (ELIQUIS) 2.5 MG TABS tablet take one twice a day Orally twice a day for 90 days     aspirin EC 81 MG tablet  (Patient not taking: Reported on 08/29/2021)     atorvastatin (LIPITOR) 40 MG tablet 1 tablet Orally Once a day     cadexomer iodine (IODOSORB) 0.9 % gel Apply 1 application. topically every other day as needed for wound care.     cholecalciferol (VITAMIN D3) 25 MCG  (1000 UNIT) tablet Take 1 tablet (1,000 Units total) by mouth daily. 30 tablet 0   collagenase (SANTYL) 250 UNIT/GM ointment  (Patient not taking: Reported on 08/29/2021)     Cyanocobalamin (VITAMIN B-12) 5000 MCG TBDP Take 5,000 mcg by mouth daily. 30 tablet 0   denosumab (PROLIA) 60 MG/ML SOSY injection Inject 60 mg into the skin every 6 (six) months.     docusate sodium (COLACE) 100 MG capsule Take 1 capsule (100 mg total) by mouth 2 (two) times daily. (Patient taking differently: Take 100 mg by mouth every evening.)     EPINEPHrine 0.3 mg/0.3 mL IJ SOAJ injection as directed Injection     epoetin alfa-epbx (RETACRIT) 2000 UNIT/ML injection ? dose as directed per hematology Injection every 3 weeks.     ezetimibe (ZETIA) 10 MG tablet Take 1 tablet (10 mg total) by mouth daily. 30 tablet 0   finasteride (PROSCAR) 5 MG tablet Take 1 tablet (5 mg total) by mouth daily. 30 tablet 0   fish oil-omega-3 fatty acids 1000 MG capsule Take 1 capsule (1 g total) by mouth daily. 30 capsule 0   fluticasone (FLONASE) 50 MCG/ACT nasal spray as needed.     furosemide (LASIX) 40 MG tablet 1 tablet Orally Twice a day     gabapentin (NEURONTIN) 100 MG capsule Take 2 capsules by mouth in the morning, 1 capsule in the evening, and 2 capsules at  bedtime. 90 capsule 0   galantamine (RAZADYNE ER) 8 MG 24 hr capsule Take 1 capsule (8 mg total) by mouth daily with breakfast. 30 capsule 0   hydrALAZINE (APRESOLINE) 10 MG tablet Take 1 tablet by mouth 2 (two) times daily with a meal. (Patient not taking: Reported on 08/29/2021)     hydrALAZINE (APRESOLINE) 25 MG tablet  (Patient not taking: Reported on 08/29/2021)     isosorbide mononitrate (IMDUR) 60 MG 24 hr tablet Take 1 tablet (60 mg total) by mouth daily. 30 tablet 0   leflunomide (ARAVA) 10 MG tablet  (Patient not taking: Reported on 08/29/2021)     leflunomide (ARAVA) 20 MG tablet Take 1/2 tablets (10 mg total) by mouth daily. 30 tablet 0   Magnesium 250 MG TABS Take 1  tablet (250 mg total) by mouth daily. 30 tablet 0   memantine (NAMENDA) 10 MG tablet Take 1 tablet (10 mg total) by mouth 2 (two) times daily. 60 tablet 0   memantine (NAMENDA) 5 MG tablet  (Patient not taking: Reported on 08/29/2021)     metoprolol succinate (TOPROL-XL) 25 MG 24 hr tablet 1 tablet Orally Once a day (Patient not taking: Reported on 08/29/2021)     Multiple Vitamin (MULTIVITAMIN WITH MINERALS) TABS tablet Take 1 tablet by mouth daily. (Patient not taking: Reported on 08/29/2021)     naftifine (NAFTIN) 1 % cream Apply 1 application  topically daily as needed (fungal skin infection).     naloxegol oxalate (MOVANTIK) 25 MG TABS tablet  (Patient not taking: Reported on 08/29/2021)     nitroGLYCERIN (NITROSTAT) 0.4 MG SL tablet Place 1 tablet (0.4 mg total) under the tongue every 5 (five) minutes x 3 doses as needed for chest pain. 30 tablet 3   pantoprazole (PROTONIX) 40 MG tablet 1 tablet EVERY day Oral Once a day for 90 days     polyethylene glycol (MIRALAX) 17 g packet 1 packet mixed with 8 ounces of fluid Orally Once a day for 30 day(s)     potassium chloride (KLOR-CON M) 10 MEQ tablet Take 1 tablet by mouth everyday with furosemide 30 tablet 0   predniSONE (DELTASONE) 1 MG tablet Take 3 tablets (3 mg total) by mouth daily with breakfast. 30 tablet 0   predniSONE (DELTASONE) 5 MG tablet Take 1 tablet by mouth daily. (Patient not taking: Reported on 08/29/2021)     Psyllium Husk POWD Take 1 Scoop by mouth daily.      tamsulosin (FLOMAX) 0.4 MG CAPS capsule Take 1 capsule (0.4 mg total) by mouth daily after supper. 30 capsule 0   torsemide (DEMADEX) 20 MG tablet Take 2 tablets (40 mg total) by mouth daily. 60 tablet 0   traMADol (ULTRAM) 50 MG tablet Take 1/2 tablets (25 mg total) by mouth every 6 (six) hours as needed for moderate pain. (Patient not taking: Reported on 08/29/2021) 30 tablet 0   triamcinolone cream (KENALOG) 0.1 % 1 application External 2-3 times a day for up to 14 days for  14 days     vitamin C (ASCORBIC ACID) 500 MG tablet Take 1 tablet (500 mg total) by mouth daily. 30 tablet 0   No current facility-administered medications for this visit.     Allergies:  Allergies  Allergen Reactions   Amoxicillin Other (See Comments)    Headache, debilitated   Bee Venom Anaphylaxis   Avelox [Moxifloxacin] Other (See Comments)    Unknown   Codeine Other (See Comments)   Duloxetine  Other reaction(s): Unknown   Duloxetine Hcl Other (See Comments)    felt weird   Levitra [Vardenafil] Other (See Comments)    Unknown   Morphine And Related Other (See Comments)    hypotension   Penicillin G Sodium Other (See Comments)    Severe headache, fatigue "debilitated"   Testosterone Other (See Comments)    unknown   Tizanidine Other (See Comments)    unknown   Viagra [Sildenafil] Other (See Comments)    didn't like it      Physical Exam: Blood pressure (!) 96/58, pulse 61, temperature 98.1 F (36.7 C), temperature source Temporal, resp. rate 17, height 6' (1.829 m), SpO2 98 %.  ECOG: 2    General appearance: Comfortable appearing without any discomfort Head: Normocephalic without any trauma Oropharynx: Mucous membranes are moist and pink without any thrush or ulcers. Eyes: Pupils are equal and round reactive to light. Lymph nodes: No cervical, supraclavicular, inguinal or axillary lymphadenopathy.   Heart:regular rate and rhythm.  S1 and S2 without leg edema. Lung: Clear without any rhonchi or wheezes.  No dullness to percussion. Abdomin: Soft, nontender, nondistended with good bowel sounds.  No hepatosplenomegaly. Musculoskeletal: No joint deformity or effusion.  Full range of motion noted. Neurological: No deficits noted on motor, sensory and deep tendon reflex exam. Skin: No petechial rash or dryness.  Appeared moist.     Lab Results: Lab Results  Component Value Date   WBC 4.5 08/22/2021   HGB 8.6 (L) 08/22/2021   HCT 26.9 (L) 08/22/2021    MCV 90.6 08/22/2021   PLT 126 (L) 08/22/2021     Chemistry      Component Value Date/Time   NA 139 06/11/2021 0528   NA 136 06/16/2020 1607   K 3.7 06/11/2021 0528   CL 106 06/11/2021 0528   CO2 24 06/11/2021 0528   BUN 30 (H) 06/11/2021 0528   BUN 38 (H) 06/16/2020 1607   CREATININE 1.50 (H) 06/11/2021 0528   CREATININE 1.95 (H) 04/28/2021 1123   CREATININE 1.1 04/22/2015 1029   CREATININE 1.40 (H) 03/30/2014 1157      Component Value Date/Time   CALCIUM 8.8 (L) 06/11/2021 0528   ALKPHOS 100 06/01/2021 0529   AST 18 06/01/2021 0529   AST 19 04/28/2021 1123   ALT 15 06/01/2021 0529   ALT 13 04/28/2021 1123   BILITOT 0.4 06/01/2021 0529   BILITOT 0.3 04/28/2021 1123        Impression and Plan:   88 year old with:  1.  Anemia of chronic kidney disease diagnosed in April 2023.  And currently on Retacrit.  His work-up did not reveal any iron deficiency or plasma cell disorder.  Myelodysplastic syndrome could also be a contributing factor.  At this time, I recommended continued supportive management and continuing Retacrit.  Complications that include injection related complications, venous thromboembolism and hypertension were reiterated.  Risks and benefits of a bone marrow biopsy were also discussed and I have deferred this option as it will not change our management approach.  He is agreeable to proceed at this time.  We will defer the need for transfusion for the time being.  2.  Thrombocytopenia: Remains mild and asymptomatic.  3.  Follow-up: Every 3 weeks for Retacrit injection and in 3 months for MD follow-up.  30  minutes were spent on this encounter.  The time was spent on reviewing laboratory data, reviewing complications related to therapy and outlining alternative treatment choices.Zola Button, MD  9/12/20238:23 AM

## 2021-09-12 NOTE — Patient Instructions (Signed)

## 2021-09-13 ENCOUNTER — Encounter (HOSPITAL_BASED_OUTPATIENT_CLINIC_OR_DEPARTMENT_OTHER): Payer: Medicare Other | Attending: General Surgery | Admitting: Physician Assistant

## 2021-09-13 ENCOUNTER — Ambulatory Visit: Payer: Medicare Other | Admitting: Occupational Therapy

## 2021-09-13 ENCOUNTER — Encounter: Payer: Self-pay | Admitting: Occupational Therapy

## 2021-09-13 ENCOUNTER — Ambulatory Visit: Payer: Medicare Other

## 2021-09-13 DIAGNOSIS — R41840 Attention and concentration deficit: Secondary | ICD-10-CM

## 2021-09-13 DIAGNOSIS — L89153 Pressure ulcer of sacral region, stage 3: Secondary | ICD-10-CM | POA: Insufficient documentation

## 2021-09-13 DIAGNOSIS — R2681 Unsteadiness on feet: Secondary | ICD-10-CM | POA: Diagnosis not present

## 2021-09-13 DIAGNOSIS — R4701 Aphasia: Secondary | ICD-10-CM | POA: Diagnosis not present

## 2021-09-13 DIAGNOSIS — R41841 Cognitive communication deficit: Secondary | ICD-10-CM

## 2021-09-13 DIAGNOSIS — L97412 Non-pressure chronic ulcer of right heel and midfoot with fat layer exposed: Secondary | ICD-10-CM | POA: Diagnosis not present

## 2021-09-13 DIAGNOSIS — R208 Other disturbances of skin sensation: Secondary | ICD-10-CM | POA: Diagnosis not present

## 2021-09-13 DIAGNOSIS — L97312 Non-pressure chronic ulcer of right ankle with fat layer exposed: Secondary | ICD-10-CM | POA: Insufficient documentation

## 2021-09-13 DIAGNOSIS — I69351 Hemiplegia and hemiparesis following cerebral infarction affecting right dominant side: Secondary | ICD-10-CM

## 2021-09-13 DIAGNOSIS — S71111D Laceration without foreign body, right thigh, subsequent encounter: Secondary | ICD-10-CM | POA: Insufficient documentation

## 2021-09-13 DIAGNOSIS — N401 Enlarged prostate with lower urinary tract symptoms: Secondary | ICD-10-CM | POA: Insufficient documentation

## 2021-09-13 DIAGNOSIS — I7025 Atherosclerosis of native arteries of other extremities with ulceration: Secondary | ICD-10-CM | POA: Insufficient documentation

## 2021-09-13 DIAGNOSIS — T8189XA Other complications of procedures, not elsewhere classified, initial encounter: Secondary | ICD-10-CM | POA: Diagnosis not present

## 2021-09-13 DIAGNOSIS — L97322 Non-pressure chronic ulcer of left ankle with fat layer exposed: Secondary | ICD-10-CM | POA: Diagnosis not present

## 2021-09-13 DIAGNOSIS — I872 Venous insufficiency (chronic) (peripheral): Secondary | ICD-10-CM | POA: Insufficient documentation

## 2021-09-13 DIAGNOSIS — T8131XA Disruption of external operation (surgical) wound, not elsewhere classified, initial encounter: Secondary | ICD-10-CM | POA: Insufficient documentation

## 2021-09-13 DIAGNOSIS — M6281 Muscle weakness (generalized): Secondary | ICD-10-CM

## 2021-09-13 DIAGNOSIS — R2689 Other abnormalities of gait and mobility: Secondary | ICD-10-CM | POA: Diagnosis not present

## 2021-09-13 DIAGNOSIS — W19XXXA Unspecified fall, initial encounter: Secondary | ICD-10-CM | POA: Insufficient documentation

## 2021-09-13 DIAGNOSIS — R262 Difficulty in walking, not elsewhere classified: Secondary | ICD-10-CM | POA: Diagnosis not present

## 2021-09-13 DIAGNOSIS — L89613 Pressure ulcer of right heel, stage 3: Secondary | ICD-10-CM | POA: Insufficient documentation

## 2021-09-13 DIAGNOSIS — L97822 Non-pressure chronic ulcer of other part of left lower leg with fat layer exposed: Secondary | ICD-10-CM | POA: Insufficient documentation

## 2021-09-13 NOTE — Progress Notes (Signed)
FRIEND, DORFMAN I. ([id number]) Visit Report for 09/13/2021 Arrival Information Details Patient Name: Date of Service: [name] [name] ID I. 09/13/2021 12:45 PM Medical Record Number: [id number] Patient Account Number: [id number] Date of Birth/Sex: Treating RN: January 26, 1926 (88 y.o. [name] [name] Primary Care Shah Insley: [name] Other Clinician: Referring Keynan Heffern: Treating Amore Grater/Extender: Eli Hose in Treatment: 58 Visit Information History Since Last Visit Added or deleted any medications: No Patient Arrived: Wheel Chair Any new allergies or adverse reactions: No Arrival Time: 12:45 Had a fall or experienced change in No Accompanied By: daughter and wife activities of daily living that may affect Transfer Assistance: Manual risk of falls: Patient Identification Verified: Yes Signs or symptoms of abuse/neglect since last visito No Secondary Verification Process Completed: Yes Hospitalized since last visit: No Patient Requires Transmission-Based Precautions: No Implantable device outside of the clinic excluding No Patient Has Alerts: Yes cellular tissue based products placed in the center Patient Alerts: Patient on Blood Thinner since last visit: Has Dressing in Place as Prescribed: Yes Has Compression in Place as Prescribed: Yes Pain Present Now: No Electronic Signature(s) Signed: 09/13/2021 5:59:54 PM By: Deon Pilling RN, BSN Entered By: Deon Pilling on 09/13/2021 12:46:18 -------------------------------------------------------------------------------- Lower Extremity Assessment Details Patient Name: Date of Service: [name] [name], [name] 09/13/2021 12:45 PM Medical Record Number: [id number] Patient Account Number: [id number] Date of Birth/Sex: Treating RN: Nov 12, 1926 (88 y.o. [name] [name] Primary Care Shaunak Kreis: [name] Other Clinician: Referring Joshwa Hemric: Treating Lorraine Cimmino/Extender: [name] in Treatment:  51 Edema Assessment Assessed: [Left: Yes] Patrice Paradise: Yes] Edema: [Left: Yes] [Right: Yes] Calf Left: Right: Point of Measurement: 34 cm From Medial Instep 39 cm 38 cm Ankle Left: Right: Point of Measurement: 11 cm From Medial Instep 28 cm 26 cm Knee To Floor Left: Right: From Medial Instep 52 cm 52 cm Vascular Assessment Pulses: Dorsalis Pedis Palpable: [Left:Yes] [Right:Yes] Electronic Signature(s) Signed: 09/13/2021 5:59:54 PM By: Deon Pilling RN, BSN Entered By: Deon Pilling on 09/13/2021 13:17:10 -------------------------------------------------------------------------------- Multi-Disciplinary Care Plan Details Patient Name: Date of Service: [name] [name], [name] 09/13/2021 12:45 PM Medical Record Number: [id number] Patient Account Number: [id number] Date of Birth/Sex: Treating RN: 06-26-1926 (88 y.o. [name] [name] Primary Care Embry Huss: [name] Other Clinician: Referring Malli Falotico: Treating Missy Baksh/Extender: Eli Hose in Treatment: 65 Active Inactive Abuse / Safety / Falls / Self Care Management Nursing Diagnoses: Potential for falls Goals: Patient will remain injury free related to falls Date Initiated: 03/22/2021 Target Resolution Date: 09/29/2021 Goal Status: Active Interventions: Assess: immobility, friction, shearing, incontinence upon admission and as needed Assess impairment of mobility on admission and as needed per policy Assess self care needs on admission and as needed Provide education on fall prevention Notes: Nutrition Nursing Diagnoses: Potential for alteratiion in Nutrition/Potential for imbalanced nutrition Goals: Patient/caregiver agrees to and verbalizes understanding of need to obtain nutritional consultation Date Initiated: 03/22/2021 T arget Resolution Date: 09/29/2021 Goal Status: Active Interventions: Provide education on nutrition Treatment Activities: Education provided on Nutrition : 08/02/2021 Giving  encouragement to exercise : 03/22/2021 Notes: Electronic Signature(s) Signed: 09/13/2021 5:59:54 PM By: Deon Pilling RN, BSN Entered By: Deon Pilling on 09/13/2021 13:10:12 -------------------------------------------------------------------------------- Pain Assessment Details Patient Name: Date of Service: [name] [name], [name] 09/13/2021 12:45 PM Medical Record Number: [id number] Patient Account Number: [id number] Date of Birth/Sex: Treating RN: 1926-05-19 (88 y.o. [name] [name] Primary Care Jaymes Hang: [name]  A Other Clinician: Referring Marico Buckle: Treating Arriah Wadle/Extender: Eli Hose in Treatment: 41 Active Problems Location of Pain Severity and Description of Pain Patient Has Paino No Site Locations Rate the pain. Current Pain Level: 0 Pain Management and Medication Current Pain Management: Medication: No Cold Application: No Rest: No Massage: No Activity: No T.E.N.S.: No Heat Application: No Leg drop or elevation: No Is the Current Pain Management Adequate: Adequate How does your wound impact your activities of daily livingo Sleep: No Bathing: No Appetite: No Relationship With Others: No Bladder Continence: No Emotions: No Bowel Continence: No Work: No Toileting: No Drive: No Dressing: No Hobbies: No Engineer, maintenance) Signed: 09/13/2021 5:59:54 PM By: Deon Pilling RN, BSN Entered By: Deon Pilling on 09/13/2021 12:46:40 -------------------------------------------------------------------------------- Patient/Caregiver Education Details Patient Name: Date of Service: [name] [name] ID I. 9/13/2023andnbsp12:45 PM Medical Record Number: [id number] Patient Account Number: [id number] Date of Birth/Gender: Treating RN: 1926/01/11 (88 y.o. [name] [name] Primary Care Physician: [name] Other Clinician: Referring Physician: Treating Physician/Extender: Eli Hose in Treatment: 102 Education  Assessment Education Provided To: Patient Education Topics Provided Nutrition: Handouts: Nutrition Methods: Explain/Verbal Responses: Reinforcements needed Electronic Signature(s) Signed: 09/13/2021 5:59:54 PM By: Deon Pilling RN, BSN Entered By: Deon Pilling on 09/13/2021 13:10:23 -------------------------------------------------------------------------------- Wound Assessment Details Patient Name: Date of Service: [name] [name], [name] 09/13/2021 12:45 PM Medical Record Number: [id number] Patient Account Number: [id number] Date of Birth/Sex: Treating RN: June 19, 1926 (88 y.o. [name], [name] Primary Care Donie Moulton: [name] Other Clinician: Referring Marlet Korte: Treating Ann Bohne/Extender: Eli Hose in Treatment: 51 Wound Status Wound Number: 14 Primary Open Surgical Wound Etiology: Wound Location: Left, Anterior Lower Leg Wound Open Wounding Event: Surgical Injury Status: Date Acquired: 05/26/2021 Comorbid Cataracts, Sleep Apnea, Arrhythmia, Congestive Heart Failure, Weeks Of Treatment: 10 History: Coronary Artery Disease, Hypertension, Myocardial Infarction, Clustered Wound: No End Stage Renal Disease, Gout, Dementia, Neuropathy Photos Wound Measurements Length: (cm) 2.2 Width: (cm) 1.8 Depth: (cm) 0.1 Area: (cm) 3.11 Volume: (cm) 0.311 % Reduction in Area: 20.8% % Reduction in Volume: 60.4% Epithelialization: Medium (34-66%) Tunneling: No Undermining: No Wound Description Classification: Full Thickness Without Exposed Support Structures Wound Margin: Epibole Exudate Amount: Medium Exudate Type: Serosanguineous Exudate Color: red, brown Foul Odor After Cleansing: No Slough/Fibrino Yes Wound Bed Granulation Amount: Large (67-100%) Exposed Structure Granulation Quality: Red, Hyper-granulation Fascia Exposed: No Necrotic Amount: Small (1-33%) Fat Layer (Subcutaneous Tissue) Exposed: Yes Necrotic Quality: Adherent Slough Tendon  Exposed: No Muscle Exposed: No Joint Exposed: No Bone Exposed: No Electronic Signature(s) Signed: 09/13/2021 5:59:54 PM By: Deon Pilling RN, BSN Entered By: Deon Pilling on 09/13/2021 13:00:09 -------------------------------------------------------------------------------- Wound Assessment Details Patient Name: Date of Service: [name] [name], [name] 09/13/2021 12:45 PM Medical Record Number: [id number] Patient Account Number: [id number] Date of Birth/Sex: Treating RN: March 12, 1926 (88 y.o. [name], [name] Primary Care Rebecah Dangerfield: [name] Other Clinician: Referring Johnnette Laux: Treating Malene Blaydes/Extender: Eli Hose in Treatment: 51 Wound Status Wound Number: 15 Primary Pressure Ulcer Etiology: Wound Location: Right Calcaneus Secondary Abrasion Wounding Event: Trauma Etiology: Date Acquired: 07/12/2021 Wound Open Weeks Of Treatment: 8 Status: Clustered Wound: No Comorbid Cataracts, Sleep Apnea, Arrhythmia, Congestive Heart Failure, History: Coronary Artery Disease, Hypertension, Myocardial Infarction, End Stage Renal Disease, Gout, Dementia, Neuropathy Photos Wound Measurements Length: (cm) 1.8 Width: (cm) 3 Depth: (cm) 0.1 Area: (cm) 4.241 Volume: (cm) 0.424 % Reduction in Area: 60.7% %  Reduction in Volume: 60.7% Epithelialization: Small (1-33%) Tunneling: No Undermining: No Wound Description Classification: Category/Stage III Wound Margin: Distinct, outline attached Exudate Amount: Medium Exudate Type: Serosanguineous Exudate Color: red, brown Foul Odor After Cleansing: No Slough/Fibrino Yes Wound Bed Granulation Amount: None Present (0%) Exposed Structure Necrotic Amount: Small (1-33%) Fascia Exposed: No Necrotic Quality: Eschar, Adherent Slough Fat Layer (Subcutaneous Tissue) Exposed: Yes Tendon Exposed: No Muscle Exposed: No Joint Exposed: No Bone Exposed: No Electronic Signature(s) Signed: 09/13/2021 5:59:54 PM By: Deon Pilling RN, BSN Entered By: Deon Pilling on 09/13/2021 13:00:34 -------------------------------------------------------------------------------- Wound Assessment Details Patient Name: Date of Service: [name] [name], [name] 09/13/2021 12:45 PM Medical Record Number: [id number] Patient Account Number: [id number] Date of Birth/Sex: Treating RN: July 25, 1926 (88 y.o. [name], [name] Primary Care Analyce Tavares: [name] Other Clinician: Referring Alexx Giambra: Treating Carl Butner/Extender: Eli Hose in Treatment: 51 Wound Status Wound Number: 19 Primary Abrasion Etiology: Wound Location: Left, Medial Lower Leg Wound Open Wounding Event: Gradually Appeared Status: Date Acquired: 09/06/2021 Comorbid Cataracts, Sleep Apnea, Arrhythmia, Congestive Heart Failure, Weeks Of Treatment: 0 History: Coronary Artery Disease, Hypertension, Myocardial Infarction, Clustered Wound: No End Stage Renal Disease, Gout, Dementia, Neuropathy Photos Wound Measurements Length: (cm) 3.6 Width: (cm) 2.9 Depth: (cm) 0.1 Area: (cm) 8.2 Volume: (cm) 0.82 % Reduction in Area: 0% % Reduction in Volume: 0% Epithelialization: Small (1-33%) Tunneling: No Undermining: No Wound Description Classification: Full Thickness Without Exposed Support Structures Wound Margin: Distinct, outline attached Exudate Amount: Medium Exudate Type: Serosanguineous Exudate Color: red, brown Foul Odor After Cleansing: No Slough/Fibrino No Wound Bed Granulation Amount: Large (67-100%) Exposed Structure Granulation Quality: Red, Friable Fascia Exposed: No Necrotic Amount: None Present (0%) Fat Layer (Subcutaneous Tissue) Exposed: Yes Tendon Exposed: No Muscle Exposed: No Joint Exposed: No Bone Exposed: No Electronic Signature(s) Signed: 09/13/2021 5:59:54 PM By: Deon Pilling RN, BSN Entered By: Deon Pilling on 09/13/2021  13:01:42 -------------------------------------------------------------------------------- Wound Assessment Details Patient Name: Date of Service: [name] [name], [name] 09/13/2021 12:45 PM Medical Record Number: [id number] Patient Account Number: [id number] Date of Birth/Sex: Treating RN: 17-Apr-1926 (88 y.o. [name], [name] Primary Care Zyair Rhein: [name] Other Clinician: Referring Kendalynn Wideman: Treating Bridgitte Felicetti/Extender: Eli Hose in Treatment: 51 Wound Status Wound Number: 3 Primary Venous Leg Ulcer Etiology: Wound Location: Left, Lateral Ankle Wound Open Wounding Event: Gradually Appeared Status: Date Acquired: 10/12/2020 Comorbid Cataracts, Sleep Apnea, Arrhythmia, Congestive Heart Failure, Weeks Of Treatment: 43 History: Coronary Artery Disease, Hypertension, Myocardial Infarction, Clustered Wound: No End Stage Renal Disease, Gout, Dementia, Neuropathy Photos Wound Measurements Length: (cm) 0.8 Width: (cm) 0.4 Depth: (cm) 0.1 Area: (cm) 0.251 Volume: (cm) 0.025 % Reduction in Area: 50.1% % Reduction in Volume: 83.4% Epithelialization: Large (67-100%) Tunneling: No Undermining: No Wound Description Classification: Full Thickness Without Exposed Support Structures Wound Margin: Epibole Exudate Amount: Medium Exudate Type: Serosanguineous Exudate Color: red, brown Foul Odor After Cleansing: No Slough/Fibrino No Wound Bed Granulation Amount: Large (67-100%) Exposed Structure Granulation Quality: Red, Friable Fascia Exposed: No Necrotic Amount: None Present (0%) Fat Layer (Subcutaneous Tissue) Exposed: Yes Tendon Exposed: No Muscle Exposed: No Joint Exposed: No Bone Exposed: No Electronic Signature(s) Signed: 09/13/2021 5:59:54 PM By: Deon Pilling RN, BSN Entered By: Deon Pilling on 09/13/2021 13:01:17 -------------------------------------------------------------------------------- Vitals Details Patient Name: Date of  Service: [name] [name], [name] 09/13/2021 12:45 PM Medical Record Number: [id number] Patient Account Number: [id number] Date of Birth/Sex: Treating RN: November 21, 1926 (88 y.o. M) [name],  [name] Primary Care Iyania Denne: [name] Other Clinician: Referring Shakeena Kafer: Treating Dawn Convery/Extender: Eli Hose in Treatment: 51 Vital Signs Time Taken: 12:45 Temperature (F): 97.7 Height (in): 72 Pulse (bpm): 81 Weight (lbs): 223 Respiratory Rate (breaths/min): 20 Body Mass Index (BMI): 30.2 Blood Pressure (mmHg): 127/67 Reference Range: 80 - 120 mg / dl Electronic Signature(s) Signed: 09/13/2021 5:59:54 PM By: Deon Pilling RN, BSN Entered By: Deon Pilling on 09/13/2021 12:46:32

## 2021-09-13 NOTE — Therapy (Signed)
OUTPATIENT OCCUPATIONAL THERAPY  TREATMENT NOTE  Patient Name: [name] [name] MRN: [id number] DOB:November 20, 1926, 88 y.o., male Today's Date: 09/13/2021  PCP: [name], MD REFERRING PROVIDER: Charlett Blake, MD    OT End of Session - 09/13/21 1537     Visit Number 3    Number of Visits 17    Date for OT Re-Evaluation 10/27/21    Authorization Type BCBS Medicare    OT Start Time 5465    OT Stop Time 1618    OT Time Calculation (min) 43 min    Activity Tolerance Patient tolerated treatment well    Behavior During Therapy WFL for tasks assessed/performed            Past Medical History:  Diagnosis Date   AAA (abdominal aortic aneurysm) ([place])    Arthritis    Ascending aorta dilatation ([place]) 03/13/2012   Fusiform dilatation of the ascending thoracic aorta discovered during surgery, max diameter 4.2-4.3 cm   BPH (benign prostatic hypertrophy)    Chronic systolic heart failure ([place]) 03/03/2012   Chronic venous insufficiency    GERD (gastroesophageal reflux disease)    Glaucoma    Gout    History of kidney stones    Hyperlipidemia    Hypertension    Obesity (BMI 30-39.9)    Paroxysmal atrial fibrillation ([place]) 02/29/2012   Recurrent paroxysmal, new-onset    PMR (polymyalgia rheumatica) (HCC)    Polymyalgia rheumatica ([place]) 02/06/2019   Pre-diabetes    S/P CABG x 3 03/06/2012   LIMA to LAD, SVG to OM, SVG to RCA, EVH via right thigh   S/P Maze operation for atrial fibrillation 03/06/2012   Complete bilateral lesions set using bipolar radiofrequency and cryothermy ablation with clipping of LA appendage   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   s/p TAVR with a 26 mm Edwards via the left subclavian by Drs Buena Irish and Bartle   Severe aortic stenosis 09/29/2019   Sleep apnea    USES CPAP NIGHTLY   Past Surgical History:  Procedure Laterality Date   ABDOMINAL AORTOGRAM W/LOWER EXTREMITY N/A 10/26/2020   Procedure: ABDOMINAL AORTOGRAM W/LOWER  EXTREMITY;  Surgeon: Broadus John, MD;  Location: [facility];  Service: Cardiovascular;  Laterality: N/A;   CARDIAC CATHETERIZATION     CATARACT EXTRACTION, BILATERAL     with lens implants   CHOLECYSTECTOMY N/A 03/24/2012   Procedure: LAPAROSCOPIC CHOLECYSTECTOMY WITH INTRAOPERATIVE CHOLANGIOGRAM;  Surgeon: Zenovia Jarred, MD;  Location: [facility];  Service: General;  Laterality: N/A;   CORONARY ARTERY BYPASS GRAFT N/A 03/06/2012   Procedure: CORONARY ARTERY BYPASS GRAFTING (CABG);  Surgeon: Rexene Alberts, MD;  Location: [facility];  Service: Open Heart Surgery;  Laterality: N/A;   ENDOVEIN HARVEST OF GREATER SAPHENOUS VEIN Right 03/06/2012   Procedure: ENDOVEIN HARVEST OF GREATER SAPHENOUS VEIN;  Surgeon: Rexene Alberts, MD;  Location: [facility];  Service: Open Heart Surgery;  Laterality: Right;   INTRAOPERATIVE TRANSESOPHAGEAL ECHOCARDIOGRAM N/A 03/06/2012   Procedure: INTRAOPERATIVE TRANSESOPHAGEAL ECHOCARDIOGRAM;  Surgeon: Rexene Alberts, MD;  Location: [facility];  Service: Open Heart Surgery;  Laterality: N/A;   IR KYPHO LUMBAR INC FX REDUCE BONE BX UNI/BIL CANNULATION INC/IMAGING  02/23/2020   LEFT HEART CATH Right 03/02/2012   Procedure: LEFT HEART CATH;  Surgeon: Sherren Mocha, MD;  Location: [facility];  Service: Cardiovascular;  Laterality: Right;   MAZE N/A 03/06/2012   Procedure: MAZE;  Surgeon: Rexene Alberts, MD;  Location: [place] [facility];  Service:  Open Heart Surgery;  Laterality: N/A;   PERIPHERAL VASCULAR INTERVENTION Right 10/26/2020   Procedure: PERIPHERAL VASCULAR INTERVENTION;  Surgeon: Broadus John, MD;  Location: [facility];  Service: Cardiovascular;  Laterality: Right;   PROSTATECTOMY     partial   RIGHT/LEFT HEART CATH AND CORONARY/GRAFT ANGIOGRAPHY N/A 09/29/2019   Procedure: RIGHT/LEFT HEART CATH AND CORONARY/GRAFT ANGIOGRAPHY;  Surgeon: Adrian Prows, MD;  Location: [facility];  Service: Cardiovascular;  Laterality: N/A;   TEE WITHOUT CARDIOVERSION N/A 11/10/2019    Procedure: TRANSESOPHAGEAL ECHOCARDIOGRAM (TEE);  Surgeon: Burnell Blanks, MD;  Location: [facility];  Service: Open Heart Surgery;  Laterality: N/A;   TRANSCATHETER AORTIC VALVE REPLACEMENT, TRANSFEMORAL  11/10/2019   Patient Active Problem List   Diagnosis Date Noted   Left middle cerebral artery stroke ([place]) 05/31/2021   Acute CVA (cerebrovascular accident) ([place]) 05/25/2021   DNR (do not resuscitate) 05/25/2021   Osteoporosis 05/05/2021   Gouty arthritis 02/13/2021   Morbid obesity ([place]) 02/13/2021   Abdominal aortic aneurysm without rupture ([place]) 11/22/2020   Acute renal failure syndrome ([place]) 11/22/2020   Low back pain 11/22/2020   Skin tear of right upper arm without complication 95/09/3265   Pressure injury of skin 10/23/2020   Urinary tract infection with hematuria 10/22/2020   Encephalopathy 10/22/2020   AKI (acute kidney injury) ([place])    Overweight (BMI 25.0-29.9) 08/19/2020   Vertebral fracture, osteoporotic ([place]) 08/18/2020   T8 vertebral fracture ([place]) 08/17/2020   Thrombocytopenia ([place]) 08/17/2020   CKD (chronic kidney disease) stage 3, GFR 30-59 ml/min (HCC) 08/17/2020   Elevated troponin 08/17/2020   Pain due to onychomycosis of toenails of both feet 07/27/2020   Callus of heel 07/27/2020   Bifascicular block 11/11/2019   Acute on chronic diastolic heart failure ([place]) 11/10/2019   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   Severe aortic stenosis 09/29/2019   Aortic valve disorder 12/45/8099   Systolic heart failure ([place]) 03/11/2019   Thrombophilia ([place]) 02/26/2019   Muscle pain 02/11/2019   Weakness 02/06/2019   Polymyalgia rheumatica ([place]) 02/06/2019   Shoulder joint pain 02/02/2019   Anemia 01/26/2019   Malignant melanoma of skin ([place]) 01/26/2019   Pain in right leg 01/26/2019   DDD (degenerative disc disease), cervical 05/28/2018   Encounter for general adult medical examination without abnormal findings 11/19/2017   Carotid artery  occlusion 07/03/2017   Hearing loss 07/03/2017   Fall 06/19/2017   Injury of upper extremity 06/19/2017   Neck pain 06/19/2017   Headache 06/19/2017   Visual disturbance 06/19/2017   Ganglion of hand 05/08/2017   Dilatation of aorta ([place]) 02/14/2017   Plantar fascial fibromatosis 02/14/2017   Cardiac murmur, unspecified 02/27/2016   Benign neoplasm of colon 08/24/2015   Dementia ([place]) 08/24/2015   Influenza due to unidentified influenza virus with other respiratory manifestations 03/15/2015   Epistaxis 12/01/2014   OSA (obstructive sleep apnea) 11/18/2014   Gastro-esophageal reflux disease with esophagitis 10/29/2014   Angina pectoris ([place]) 05/04/2014   Abnormal feces 07/31/2013   Other specified disorders of gingiva and edentulous alveolar ridge 07/31/2013   Spontaneous ecchymosis 10/10/2012   Thoracic ascending aortic aneurysm ([place]) 09/29/2012   Microscopic hematuria 04/11/2012   Proteinuria 04/11/2012   S/P laparoscopic cholecystectomy 04/09/2012   S/P CABG x 3 03/06/2012   S/P Maze operation for atrial fibrillation 03/06/2012   Paroxysmal atrial fibrillation ([place]) 02/29/2012   Bitten or stung by nonvenomous insect and other nonvenomous arthropods, initial encounter 12/03/2011   Depression  screening 10/17/2011   CAD (coronary artery disease), native coronary artery    Hyperlipidemia    Hypertension    Chronic venous insufficiency    Gout    Benign prostatic hyperplasia    Post-traumatic stress disorder 03/13/2011   Pain in left leg 12/08/2010   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   Fatigue 08/08/2010   Retention of urine 04/18/2010   Constipation 07/05/2009   Kidney stone 07/05/2009   ED (erectile dysfunction) of organic origin 12/14/2008   Impaired fasting glucose 12/14/2008   Vitamin B deficiency 12/14/2008    ONSET DATE: 05/25/2021  REFERRING DIAG: Z61.096 (ICD-10-CM) - Left middle cerebral artery stroke   THERAPY DIAG:  Hemiplegia and hemiparesis following  cerebral infarction affecting right dominant side (HCC)  Other disturbances of skin sensation  Attention and concentration deficit  Muscle weakness (generalized)  Rationale for Evaluation and Treatment Rehabilitation  SUBJECTIVE:   SUBJECTIVE STATEMENT: Pt reports he is still noticing difficulty w/ his handwriting, particularly due to spelling Pt accompanied by: self and significant other (dropped off pt)  PERTINENT HISTORY:  H/o AAA, CAD with CABG, compression fractures, chronic anemia followed by hematology services, chronic systolic congestive heart failure, A-fib/AF status post MAZE on Xarelto, hypertension, hyperlipidemia, PMR maintained on steroids, prediabetes, early dementia maintained on Namenda, chronic lower extremity wounds with Unna boot OSA with CPAP, PAD with lymphedema status post balloon angioplasty of posterior tibial artery October 2022 recent UTI placed on antibiotic therapy.  He does have a personal care attendant Monday Wednesday Friday for 3 hours.  Modified independent transfers power wheelchair bound x3 to 4 months prior to that ambulating short distances with a rollator.  Present 05/25/2021 with right-sided weakness and aphasia of acute onset.  Cranial CT scan showed small left MCA territory cortical infarct within the mid to posterior left frontal lobe.  CT angiogram head and neck no large vessel occlusion.   PRECAUTIONS: Fall  PAIN:  Are you having pain? Yes: NPRS scale: 3/10 Pain location: mid back Pain description: constant; dull, achy Aggravating factors: standing Relieving factors: injections, repositioning  FALLS: Has patient fallen in last 6 months? Yes. Number of falls 4, has not fallen recently, per pt report  LIVING ENVIRONMENT: Lives with: lives with their spouse Lives in: House/apartment Stairs:  Yes: steps on front and back of house, however has an elevator at the back, steps to 2nd floor but does not need to go upstairs Has following equipment  at home: Environmental consultant - 2 wheeled, Environmental consultant - 4 wheeled, Wheelchair (power), Wheelchair (manual), shower chair, bed side commode, Grab bars, and elevator to access home from outside, hand held shower head  PLOF:  Has a caregiver 3x/week that assists with ADLs, d/t spinal compression fx pt has become more w/c bound in 5-6 months due to    [place] to talk properly; to write and to spell  OBJECTIVE:   HAND DOMINANCE: Right  ADLs: Dressing: Setup w/ UB dressing, wife assists w/ pants, socks, shoes Grooming: Mod I from seated position Eating: reports great difficulty using a knife to cut food, wife frequently cuts foods for him Toileting: Occ difficulty pulling pants back up after toileting, leans forward to complete hygiene Bathing: Supervision/setup  Tub Shower transfers: caregiver assist w/ transfers in/out of shower providing SPV and occ Min A Equipment: Shower seat with back, Grab bars, Walk in shower, bed side commode, Reacher, and Sock aid  IADLs: Financial management: Reports difficulty with writing a check, math, spelling, but did most of financial management  Handwriting: 50% legible and difficulty with spelling that has gotten worse since CVA  MOBILITY STATUS: Needs Assist: Mod I stand pivot from w/c, Supervision for shower transfers  FUNCTIONAL OUTCOME MEASURES: FOTO: TBD at next session  UPPER EXTREMITY MMT:     MMT Right eval Left eval  Shoulder flexion 4+/5 4+/5  Shoulder abduction    Shoulder adduction    Shoulder extension    Shoulder internal rotation    Shoulder external rotation    Middle trapezius    Lower trapezius    Elbow flexion 4+/5 4+/5  Elbow extension 4+/5 4+/5  Wrist flexion    Wrist extension    Wrist ulnar deviation    Wrist radial deviation    Wrist pronation    Wrist supination    (Blank rows = not tested)  HAND FUNCTION: Grip strength: Right: 30 lbs; Left: 30 lbs  COORDINATION: 9 Hole Peg test: Right: 1:06.53 sec; Left: 34.53  sec  SENSATION: Reports numbness and difficulty maintaining grasp on spoon/fork and writing utensil  -------------------------------------------------------------------------------------------------------------------------------------------------------- (objective measures above completed at initial evaluation unless otherwise dated)  TODAY'S TREATMENT:  Handwriting Pt practiced handwriting on standard paper using standard marker w/ good  legibility. OT provided education regarding difficulty w/ spelling being due to aphasia vs due to decreased coordination/praxis deficits; pt verbalized understanding. Also discussed options for activities to practice handwriting and letter formation at home (e.g., tracing letters of a word search activity or completing crossword puzzles)  Pegboard Copying pattern onto pegboard w/ mini pegs to facilitate NMR of R hand coordination and dexterity, FMC, and pinch and release. Pt able to complete activity w/ no cues for spatial orientation when copying pattern and min difficulty/drops. OT incr challenge to incorporate palm-to-finger translation w/ pt able to complete holding 2 pegs at a time.  Coordination Activities Coordination activities w/ RUE or BUEs as appropriate, including: rotating small ball w/ fingertips, tossing ball from one hand to the other, tossing/catching ball w/ ipsilateral UE, flipping cards one at a time off a deck, rotating cards in-hand/turning cards end-over end, pushing cards off deck held in-hand using thumb only, shuffling a deck of cards, picking up various small objects/coins and trying to see how many pt is able to hold in-hand without drops, picking up coins and stacking them, and picking up 5-10 coins 1 at a time and translating palm to fingertips to place in a container; coordination activities handout provided w/ OT educating patient that these are just ideas and encouraging him to get creative and find tasks that are motivating to him     PATIENT EDUCATION: Ongoing condition-specific education related to therapeutic interventions completed this session Person educated: Patient Education method: Explanation, Demonstration, and Handouts Education comprehension: verbalized understanding   HOME EXERCISE PROGRAM: Access Code: [email or web address] URL: [email or web address] Date: 09/11/2021 Prepared by: [facility]  Exercises - Putty Squeezes  - 1 x daily - 1 sets - 10 reps - Rolling Putty on Table  - 1 x daily - 1 sets - 10 reps - Thumb Opposition with Putty  - 1 x daily - 1 sets - 10 reps - Key Pinch with Putty  - 1 x daily - 1 sets - 10 reps - Tip Pinch with Putty  - 1 x daily - 1 sets - 10 reps - Removing Marbles from Putty  - 1 x daily - 1 sets - 10 reps  GOALS: Goals reviewed with patient? Yes  SHORT TERM GOALS: Target  date: 09/29/2021  Pt will verbalize understanding of adapted strategies and/or potential AE needs to increase ease, safety, and independence w/ LB dressing. Baseline: Goal status: IN PROGRESS  2.  Pt will be independent with FMC/GMC and strengthening HEP to increase independence with handwriting and cutting of foods. Baseline:  Goal status: IN PROGRESS  3. Pt will demonstrate improved fine motor coordination for ADLs as evidenced by decreasing 9 hole peg test score for RUE by 5 secs  Baseline: 9 Hole Peg test: Right: 1:06.53 sec; Left: 34.53 sec  Goal status: IN PROGRESS   LONG TERM GOALS: Target date: 10/27/2021  Pt will report ability to complete LB dressing with supervision for standing balance and use of AE as needed. Baseline:  Goal status: IN PROGRESS  2.  Pt will demonstrated improved standing balance to stand for 1 min with alternating UE to allow for increased independence with LB dressing/clothing management post toileting. Baseline:  Goal status: IN PROGRESS  3.  Pt will demonstrated improved working memory and recall during check  writing/balancing check book by making 0 errors. Baseline:  Goal status: IN PROGRESS  4.  Pt will demonstrate improved fine motor coordination for ADLs as evidenced by decreasing 9 hole peg test score for RUE by 10 secs Baseline: 9 Hole Peg test: Right: 1:06.53 sec; Left: 34.53 sec Goal status: IN PROGRESS  5.  Pt will write a short paragraph with 90% legibility and no significant errors in spelling as evidenced by </= to 3 spelling errors. Baseline:  Goal status: IN PROGRESS   ASSESSMENT:  CLINICAL IMPRESSION: OT focused session on [facility] to improve participation w/ handwriting as this continues to be one of the biggest goals for Mr. Eakle. After observing handwriting, difficulty w/ spelling is likely due to aphasia, as smoothness of movements, fluency, and letter formation look good. Due to this, OT practiced higher level FM skills including translation and rotation w/ good results. Briefly reviewed coordination activities handout to provide pt w/ more ideas of activities and tasks he can do at home to continue to progress these skills.  PERFORMANCE DEFICITS in functional skills including ADLs, IADLs, coordination, sensation, ROM, strength, pain, FMC, GMC, balance, body mechanics, endurance, decreased knowledge of use of DME, and UE functional use, cognitive skills including attention, problem solving, sequencing, and thought, and psychosocial skills including coping strategies, environmental adaptation, and routines and behaviors.   IMPAIRMENTS are limiting patient from ADLs and IADLs.   COMORBIDITIES may have co-morbidities  that affects occupational performance. Patient will benefit from skilled OT to address above impairments and improve overall function.  PLAN: OT FREQUENCY: 2x/week  OT DURATION: 8 weeks  PLANNED INTERVENTIONS: self care/ADL training, therapeutic exercise, therapeutic activity, neuromuscular re-education, balance training, functional mobility training, moist heat,  cryotherapy, patient/family education, cognitive remediation/compensation, psychosocial skills training, energy conservation, coping strategies training, and DME and/or AE instructions  RECOMMENDED OTHER SERVICES: N/A  CONSULTED AND AGREED WITH PLAN OF CARE: Patient  PLAN FOR NEXT SESSION: review coordination HEP if needed, engage in checkbook activity to address handwriting, spelling, and mental math   Kathrine Cords, OTR/L 09/13/2021, 4:29 PM

## 2021-09-13 NOTE — Progress Notes (Addendum)
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 [name] on  09/13/2021 13:47:16 -------------------------------------------------------------------------------- SuperBill Details Patient Name: Date of Service: [name], [name] 09/13/2021 Medical Record Number: [id number] Patient Account Number: [id number] Date of Birth/Sex: Treating RN: May 19, 1926 (88 y.o. [name], [name] Primary Care Provider: [name] Other Clinician: Referring Provider: Treating Provider/Extender: Eli Hose in Treatment: 13 Diagnosis Coding ICD-10 Codes Code Description [id number] Pressure ulcer of right heel, stage 3 I73.89 Other specified peripheral vascular diseases I87.2 Venous insufficiency (chronic) (peripheral) L97.312 Non-pressure chronic ulcer of right ankle with fat layer exposed L97.822 Non-pressure chronic ulcer of other part of left lower leg with fat layer exposed L89.153 Pressure ulcer of sacral region, stage 3 S71.111D Laceration without foreign body, right thigh, subsequent encounter T81.31XA Disruption of external operation (surgical) wound, not elsewhere classified, initial encounter N40.1 Benign prostatic hyperplasia with lower urinary tract symptoms N18.30 Chronic kidney disease, stage 3 unspecified I10 Essential (primary) hypertension Physician Procedures Electronic Signature(s) Signed: 09/13/2021 1:50:16 PM By: [name] PA-C Entered By: [name] on 09/13/2021 13:50:16

## 2021-09-13 NOTE — Therapy (Signed)
OUTPATIENT SPEECH LANGUAGE PATHOLOGY TREATMENT   Patient Name: [name] [name] MRN: [id number] DOB:17-Jan-1926, 88 y.o., male Today's Date: 09/13/2021  PCP: [name], MD REFERRING PROVIDER: Alysia Penna, MD   End of Session - 09/13/21 1737     Visit Number 2    Number of Visits 17    Date for SLP Re-Evaluation 10/27/21    SLP Start Time 1450    SLP Stop Time  5361    SLP Time Calculation (min) 40 min    Activity Tolerance Patient tolerated treatment well              Past Medical History:  Diagnosis Date   AAA (abdominal aortic aneurysm) ([place])    Arthritis    Ascending aorta dilatation ([place]) 03/13/2012   Fusiform dilatation of the ascending thoracic aorta discovered during surgery, max diameter 4.2-4.3 cm   BPH (benign prostatic hypertrophy)    Chronic systolic heart failure ([place]) 03/03/2012   Chronic venous insufficiency    GERD (gastroesophageal reflux disease)    Glaucoma    Gout    History of kidney stones    Hyperlipidemia    Hypertension    Obesity (BMI 30-39.9)    Paroxysmal atrial fibrillation ([place]) 02/29/2012   Recurrent paroxysmal, new-onset    PMR (polymyalgia rheumatica) (HCC)    Polymyalgia rheumatica ([place]) 02/06/2019   Pre-diabetes    S/P CABG x 3 03/06/2012   LIMA to LAD, SVG to OM, SVG to RCA, EVH via right thigh   S/P Maze operation for atrial fibrillation 03/06/2012   Complete bilateral lesions set using bipolar radiofrequency and cryothermy ablation with clipping of LA appendage   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   s/p TAVR with a 26 mm Edwards via the left subclavian by Drs Buena Irish and Bartle   Severe aortic stenosis 09/29/2019   Sleep apnea    USES CPAP NIGHTLY   Past Surgical History:  Procedure Laterality Date   ABDOMINAL AORTOGRAM W/LOWER EXTREMITY N/A 10/26/2020   Procedure: ABDOMINAL AORTOGRAM W/LOWER EXTREMITY;  Surgeon: Broadus John, MD;  Location: [facility];  Service:  Cardiovascular;  Laterality: N/A;   CARDIAC CATHETERIZATION     CATARACT EXTRACTION, BILATERAL     with lens implants   CHOLECYSTECTOMY N/A 03/24/2012   Procedure: LAPAROSCOPIC CHOLECYSTECTOMY WITH INTRAOPERATIVE CHOLANGIOGRAM;  Surgeon: Zenovia Jarred, MD;  Location: [place] [facility];  Service: General;  Laterality: N/A;   CORONARY ARTERY BYPASS GRAFT N/A 03/06/2012   Procedure: CORONARY ARTERY BYPASS GRAFTING (CABG);  Surgeon: Rexene Alberts, MD;  Location: [place];  Service: Open Heart Surgery;  Laterality: N/A;   ENDOVEIN HARVEST OF GREATER SAPHENOUS VEIN Right 03/06/2012   Procedure: ENDOVEIN HARVEST OF GREATER SAPHENOUS VEIN;  Surgeon: Rexene Alberts, MD;  Location: [facility];  Service: Open Heart Surgery;  Laterality: Right;   INTRAOPERATIVE TRANSESOPHAGEAL ECHOCARDIOGRAM N/A 03/06/2012   Procedure: INTRAOPERATIVE TRANSESOPHAGEAL ECHOCARDIOGRAM;  Surgeon: Rexene Alberts, MD;  Location: [facility];  Service: Open Heart Surgery;  Laterality: N/A;   IR KYPHO LUMBAR INC FX REDUCE BONE BX UNI/BIL CANNULATION INC/IMAGING  02/23/2020   LEFT HEART CATH Right 03/02/2012   Procedure: LEFT HEART CATH;  Surgeon: Sherren Mocha, MD;  Location[facility];  Service: Cardiovascular;  Laterality: Right;   MAZE N/A 03/06/2012   Procedure: MAZE;  Surgeon: Rexene Alberts, MD;  Location: [place];  Service: Open Heart Surgery;  Laterality: N/A;   PERIPHERAL VASCULAR INTERVENTION Right 10/26/2020   Procedure: PERIPHERAL  VASCULAR INTERVENTION;  Surgeon: Broadus John, MD;  Location: [place] [facility];  Service: Cardiovascular;  Laterality: Right;   PROSTATECTOMY     partial   RIGHT/LEFT HEART CATH AND CORONARY/GRAFT ANGIOGRAPHY N/A 09/29/2019   Procedure: RIGHT/LEFT HEART CATH AND CORONARY/GRAFT ANGIOGRAPHY;  Surgeon: Adrian Prows, MD;  Location: [place] [facility];  Service: Cardiovascular;  Laterality: N/A;   TEE WITHOUT CARDIOVERSION N/A 11/10/2019   Procedure: TRANSESOPHAGEAL ECHOCARDIOGRAM (TEE);  Surgeon: Burnell Blanks,  MD;  Location: [place];  Service: Open Heart Surgery;  Laterality: N/A;   TRANSCATHETER AORTIC VALVE REPLACEMENT, TRANSFEMORAL  11/10/2019   Patient Active Problem List   Diagnosis Date Noted   Left middle cerebral artery stroke ([place]) 05/31/2021   Acute CVA (cerebrovascular accident) ([place]) 05/25/2021   DNR (do not resuscitate) 05/25/2021   Osteoporosis 05/05/2021   Gouty arthritis 02/13/2021   Morbid obesity ([place]) 02/13/2021   Abdominal aortic aneurysm without rupture ([place]) 11/22/2020   Acute renal failure syndrome ([place]) 11/22/2020   Low back pain 11/22/2020   Skin tear of right upper arm without complication 31/54/0086   Pressure injury of skin 10/23/2020   Urinary tract infection with hematuria 10/22/2020   Encephalopathy 10/22/2020   AKI (acute kidney injury) ([place])    Overweight (BMI 25.0-29.9) 08/19/2020   Vertebral fracture, osteoporotic ([place]) 08/18/2020   T8 vertebral fracture ([place]) 08/17/2020   Thrombocytopenia ([place]) 08/17/2020   CKD (chronic kidney disease) stage 3, GFR 30-59 ml/min (HCC) 08/17/2020   Elevated troponin 08/17/2020   Pain due to onychomycosis of toenails of both feet 07/27/2020   Callus of heel 07/27/2020   Bifascicular block 11/11/2019   Acute on chronic diastolic heart failure ([place]) 11/10/2019   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   Severe aortic stenosis 09/29/2019   Aortic valve disorder 76/19/5093   Systolic heart failure ([place]) 03/11/2019   Thrombophilia ([place]) 02/26/2019   Muscle pain 02/11/2019   Weakness 02/06/2019   Polymyalgia rheumatica ([place]) 02/06/2019   Shoulder joint pain 02/02/2019   Anemia 01/26/2019   Malignant melanoma of skin ([place]) 01/26/2019   Pain in right leg 01/26/2019   DDD (degenerative disc disease), cervical 05/28/2018   Encounter for general adult medical examination without abnormal findings 11/19/2017   Carotid artery occlusion 07/03/2017   Hearing loss 07/03/2017   Fall 06/19/2017   Injury of  upper extremity 06/19/2017   Neck pain 06/19/2017   Headache 06/19/2017   Visual disturbance 06/19/2017   Ganglion of hand 05/08/2017   Dilatation of aorta ([place]) 02/14/2017   Plantar fascial fibromatosis 02/14/2017   Cardiac murmur, unspecified 02/27/2016   Benign neoplasm of colon 08/24/2015   Dementia ([place]) 08/24/2015   Influenza due to unidentified influenza virus with other respiratory manifestations 03/15/2015   Epistaxis 12/01/2014   OSA (obstructive sleep apnea) 11/18/2014   Gastro-esophageal reflux disease with esophagitis 10/29/2014   Angina pectoris ([place]) 05/04/2014   Abnormal feces 07/31/2013   Other specified disorders of gingiva and edentulous alveolar ridge 07/31/2013   Spontaneous ecchymosis 10/10/2012   Thoracic ascending aortic aneurysm ([place]) 09/29/2012   Microscopic hematuria 04/11/2012   Proteinuria 04/11/2012   S/P laparoscopic cholecystectomy 04/09/2012   S/P CABG x 3 03/06/2012   S/P Maze operation for atrial fibrillation 03/06/2012   Paroxysmal atrial fibrillation ([place]) 02/29/2012   Bitten or stung by nonvenomous insect and other nonvenomous arthropods, initial encounter 12/03/2011   Depression screening 10/17/2011   CAD (coronary artery disease), native coronary artery    Hyperlipidemia  Hypertension    Chronic venous insufficiency    Gout    Benign prostatic hyperplasia    Post-traumatic stress disorder 03/13/2011   Pain in left leg 12/08/2010   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 male with medical history significant of AAA; CAD s/p CABG; chronic systolic CHF; afib s/p Maze, on Xarelto; HTN; HLD; PMR (currently on steroids); pre-diabetes; OSA on CPAP; PAD with lymphedema; and AS s/p TAVR presenting with stroke-like symptoms on 05/25/21. FAmily had a meeting with bad news.  Shortly thereafter, he was trembling on one side with slurred speech.  His episode lasted less than a minute but the speech difficulty persisted. He completed home health PT OT and speech and arrives at OP rehab for his continuation of rehabilitative care.  Inpatient rehab discharge note: "Patient has made excellent gains and has met 5 of 5 long-term goals this admission due to improved motor speech, word finding skills, complex problem solving, and attention. Pt is completing high level cognitive-linguistic tasks with mod I-to-sup A in regards to problem solving, attention, and functional recall. Pt demonstrates improved self-monitoring and self correction of verbal errors when communicating at the phrase/sentence level. Pt continues to exhibit mild higher level word finding, spelling, motor speech production, and alternating attention deficits. Patient and family education is complete and patient to discharge at overall mod I-to-sup A level. Patient's care partner is independent to provide the necessary physical and cognitive assistance at discharge. Pt would continue to benefit from skilled ST services in order to maximize functional independence and reduce burden of care, with continued skilled ST services at discharge."  PAIN:  Are you having pain? Yes: NPRS scale: 3/10 Pain location: lumbar spine Pain description: aching, sore, sharp Aggravating factors: standing/walking Relieving factors: recliner  PATIENT GOALS  Speak intelligently, write with better spelling  ability  OBJECTIVE:   TODAY'S TREATMENT:  09/13/21: Assessed pt's writing skills for language ability today. Eino made aphasic errors in all sets of responses (divergent naming tasks). Pt aware of errors 75% of the time. With error words SLP provided "fill in the blank", and pt with correct response 80% of the time. SLP provided him practice with written language accuracy, with instructions to wife to assist if he has difficulty with the same means (fill in the blank).  08/29/21: Discussed evaluation results, probable goals, and discussed pt's need to write more and provide himself more opportunity for practice.   PATIENT EDUCATION: Education details: see above in "today's treatment" Person educated: Patient Education method: Explanation Education comprehension: verbalized understanding and needs further education    PATIENT REPORTED OUTCOME MEASURES (PROM): Communication Participation Item Bank: to be completed next session and Communication Effectiveness Survey: to be completed next session    GOALS: Goals reviewed with patient? Yes  SHORT TERM GOALS: Target date: 09/26/2021    Pt will name average 8 items/60 seconds in simple category important to pt in  3 sessions Baseline: Goal status: Ongoing  2.  Pt will write to-do lists, errand lists (1-3 word responses) functionally in 3 sessions Baseline:  Goal status: Ongoing  3.  Pt will participate in 8 minutes functional mod complex conversation with modified independence in 2 sessions Baseline:  Goal status: Ongoing    LONG TERM GOALS: Target date: 10/24/2021   Pt will write sentences of 5+ words, functionally, with rare min A in 3 sessions Baseline:  Goal status: Ongoing  2.   Pt will participate in 10 minutes functional mod complex-complex conversation with modified independence in 3 sessions Baseline:  Goal status: Ongoing   ASSESSMENT:  CLINICAL IMPRESSION: Patient is a 88 y.o. male who was seen today for speech  and language therapy following CVA in May 2023. Today pt demonstrated written aphasia at word level, and was given homework to target this. Writing improved with fill in the blank tasks. Pt's simple to mod complex verbal expression was functional with consistent extra time. He was able to communicate his intended message with success using circumlocution and synonym strategies.   OBJECTIVE IMPAIRMENTS include expressive language and aphasia. These impairments are limiting patient from ADLs/IADLs and effectively communicating at home and in community. Factors affecting potential to achieve goals and functional outcome are  none . Patient will benefit from skilled SLP services to address above impairments and improve overall function.  REHAB POTENTIAL: Good  PLAN: SLP FREQUENCY: 2x/week  SLP DURATION: 8 weeks  PLANNED INTERVENTIONS: Language facilitation, Environmental controls, Cueing hierachy, Internal/external aids, Oral motor exercises, Functional tasks, Multimodal communication approach, SLP instruction and feedback, Compensatory strategies, and Patient/family education    [facility], [place] 09/13/2021, 5:37 PM

## 2021-09-15 NOTE — Therapy (Signed)
OUTPATIENT PHYSICAL THERAPY NEURO TREATMENT   Patient Name: [name] [name] MRN: [id number] DOB:[birth year], 88 y.o., male Today's Date: 09/18/2021   PCP: [name], MD REFERRING PROVIDER: Charlett Blake, MD    PT End of Session - 09/18/21 1529     Visit Number 3    Number of Visits 16    Date for PT Re-Evaluation 11/07/21    Authorization Type BCBS Medicare    Authorization Time Period no VL, no auth    PT Start Time 1448    PT Stop Time 1528    PT Time Calculation (min) 40 min    Equipment Utilized During Treatment Gait belt;Other (comment)   R offloading boot   Activity Tolerance Patient tolerated treatment well    Behavior During Therapy WFL for tasks assessed/performed               Past Medical History:  Diagnosis Date   AAA (abdominal aortic aneurysm) (HCC)    Arthritis    Ascending aorta dilatation ([place]) 03/13/2012   Fusiform dilatation of the ascending thoracic aorta discovered during surgery, max diameter 4.2-4.3 cm   BPH (benign prostatic hypertrophy)    Chronic systolic heart failure ([place]) 03/03/2012   Chronic venous insufficiency    GERD (gastroesophageal reflux disease)    Glaucoma    Gout    History of kidney stones    Hyperlipidemia    Hypertension    Obesity (BMI 30-39.9)    Paroxysmal atrial fibrillation ([place]) 02/29/2012   Recurrent paroxysmal, new-onset    PMR (polymyalgia rheumatica) (HCC)    Polymyalgia rheumatica ([place]) 02/06/2019   Pre-diabetes    S/P CABG x 3 03/06/2012   LIMA to LAD, SVG to OM, SVG to RCA, EVH via right thigh   S/P Maze operation for atrial fibrillation 03/06/2012   Complete bilateral lesions set using bipolar radiofrequency and cryothermy ablation with clipping of LA appendage   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   s/p TAVR with a 26 mm Edwards via the left subclavian by Drs Buena Irish and Bartle   Severe aortic stenosis 09/29/2019   Sleep apnea    USES CPAP NIGHTLY   Past Surgical  History:  Procedure Laterality Date   ABDOMINAL AORTOGRAM W/LOWER EXTREMITY N/A 10/26/2020   Procedure: ABDOMINAL AORTOGRAM W/LOWER EXTREMITY;  Surgeon: Broadus John, MD;  Location: [facility];  Service: Cardiovascular;  Laterality: N/A;   CARDIAC CATHETERIZATION     CATARACT EXTRACTION, BILATERAL     with lens implants   CHOLECYSTECTOMY N/A 03/24/2012   Procedure: LAPAROSCOPIC CHOLECYSTECTOMY WITH INTRAOPERATIVE CHOLANGIOGRAM;  Surgeon: Zenovia Jarred, MD;  Location: [facility];  Service: General;  Laterality: N/A;   CORONARY ARTERY BYPASS GRAFT N/A 03/06/2012   Procedure: CORONARY ARTERY BYPASS GRAFTING (CABG);  Surgeon: Rexene Alberts, MD;  Location: [facility];  Service: Open Heart Surgery;  Laterality: N/A;   ENDOVEIN HARVEST OF GREATER SAPHENOUS VEIN Right 03/06/2012   Procedure: ENDOVEIN HARVEST OF GREATER SAPHENOUS VEIN;  Surgeon: Rexene Alberts, MD;  Location: [facility];  Service: Open Heart Surgery;  Laterality: Right;   INTRAOPERATIVE TRANSESOPHAGEAL ECHOCARDIOGRAM N/A 03/06/2012   Procedure: INTRAOPERATIVE TRANSESOPHAGEAL ECHOCARDIOGRAM;  Surgeon: Rexene Alberts, MD;  Location: [facility];  Service: Open Heart Surgery;  Laterality: N/A;   IR KYPHO LUMBAR INC FX REDUCE BONE BX UNI/BIL CANNULATION INC/IMAGING  02/23/2020   LEFT HEART CATH Right 03/02/2012   Procedure: LEFT HEART CATH;  Surgeon: Sherren Mocha, MD;  Location: [facility];  Service: Cardiovascular;  Laterality: Right;   MAZE N/A 03/06/2012   Procedure: MAZE;  Surgeon: Rexene Alberts, MD;  Location: [facility];  Service: Open Heart Surgery;  Laterality: N/A;   PERIPHERAL VASCULAR INTERVENTION Right 10/26/2020   Procedure: PERIPHERAL VASCULAR INTERVENTION;  Surgeon: Broadus John, MD;  Location: [facility];  Service: Cardiovascular;  Laterality: Right;   PROSTATECTOMY     partial   RIGHT/LEFT HEART CATH AND CORONARY/GRAFT ANGIOGRAPHY N/A 09/29/2019   Procedure: RIGHT/LEFT HEART CATH AND CORONARY/GRAFT ANGIOGRAPHY;  Surgeon:  Adrian Prows, MD;  Location: [facility];  Service: Cardiovascular;  Laterality: N/A;   TEE WITHOUT CARDIOVERSION N/A 11/10/2019   Procedure: TRANSESOPHAGEAL ECHOCARDIOGRAM (TEE);  Surgeon: Burnell Blanks, MD;  Location: [place] [facility];  Service: Open Heart Surgery;  Laterality: N/A;   TRANSCATHETER AORTIC VALVE REPLACEMENT, TRANSFEMORAL  11/10/2019   Patient Active Problem List   Diagnosis Date Noted   Left middle cerebral artery stroke ([place]) 05/31/2021   Acute CVA (cerebrovascular accident) ([place]) 05/25/2021   DNR (do not resuscitate) 05/25/2021   Osteoporosis 05/05/2021   Gouty arthritis 02/13/2021   Morbid obesity ([place]) 02/13/2021   Abdominal aortic aneurysm without rupture ([place]) 11/22/2020   Acute renal failure syndrome ([place]) 11/22/2020   Low back pain 11/22/2020   Skin tear of right upper arm without complication 67/89/3810   Pressure injury of skin 10/23/2020   Urinary tract infection with hematuria 10/22/2020   Encephalopathy 10/22/2020   AKI (acute kidney injury) ([place])    Overweight (BMI 25.0-29.9) 08/19/2020   Vertebral fracture, osteoporotic ([place]) 08/18/2020   T8 vertebral fracture ([place]) 08/17/2020   Thrombocytopenia ([place]) 08/17/2020   CKD (chronic kidney disease) stage 3, GFR 30-59 ml/min (HCC) 08/17/2020   Elevated troponin 08/17/2020   Pain due to onychomycosis of toenails of both feet 07/27/2020   Callus of heel 07/27/2020   Bifascicular block 11/11/2019   Acute on chronic diastolic heart failure ([place]) 11/10/2019   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   Severe aortic stenosis 09/29/2019   Aortic valve disorder 17/51/0258   Systolic heart failure ([place]) 03/11/2019   Thrombophilia ([place]) 02/26/2019   Muscle pain 02/11/2019   Weakness 02/06/2019   Polymyalgia rheumatica ([place]) 02/06/2019   Shoulder joint pain 02/02/2019   Anemia 01/26/2019   Malignant melanoma of skin ([place]) 01/26/2019   Pain in right leg 01/26/2019   DDD (degenerative  disc disease), cervical 05/28/2018   Encounter for general adult medical examination without abnormal findings 11/19/2017   Carotid artery occlusion 07/03/2017   Hearing loss 07/03/2017   Fall 06/19/2017   Injury of upper extremity 06/19/2017   Neck pain 06/19/2017   Headache 06/19/2017   Visual disturbance 06/19/2017   Ganglion of hand 05/08/2017   Dilatation of aorta ([place]) 02/14/2017   Plantar fascial fibromatosis 02/14/2017   Cardiac murmur, unspecified 02/27/2016   Benign neoplasm of colon 08/24/2015   Dementia ([place]) 08/24/2015   Influenza due to unidentified influenza virus with other respiratory manifestations 03/15/2015   Epistaxis 12/01/2014   OSA (obstructive sleep apnea) 11/18/2014   Gastro-esophageal reflux disease with esophagitis 10/29/2014   Angina pectoris ([place]) 05/04/2014   Abnormal feces 07/31/2013   Other specified disorders of gingiva and edentulous alveolar ridge 07/31/2013   Spontaneous ecchymosis 10/10/2012   Thoracic ascending aortic aneurysm ([place]) 09/29/2012   Microscopic hematuria 04/11/2012   Proteinuria 04/11/2012   S/P laparoscopic cholecystectomy 04/09/2012   S/P CABG x 3 03/06/2012   S/P Maze operation for  atrial fibrillation 03/06/2012   Paroxysmal atrial fibrillation ([place]) 02/29/2012   Bitten or stung by nonvenomous insect and other nonvenomous arthropods, initial encounter 12/03/2011   Depression screening 10/17/2011   CAD (coronary artery disease), native coronary artery    Hyperlipidemia    Hypertension    Chronic venous insufficiency    Gout    Benign prostatic hyperplasia    Post-traumatic stress disorder 03/13/2011   Pain in left leg 12/08/2010   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   Fatigue 08/08/2010   Retention of urine 04/18/2010   Constipation 07/05/2009   Kidney stone 07/05/2009   ED (erectile dysfunction) of organic origin 12/14/2008   Impaired fasting glucose 12/14/2008   Vitamin B deficiency 12/14/2008    ONSET DATE:  05/31/21  REFERRING DIAG: A19.379 (ICD-10-CM) - Left middle cerebral artery stroke (HCC)   THERAPY DIAG:  Muscle weakness (generalized)  Difficulty in walking, not elsewhere classified  Other abnormalities of gait and mobility  Unsteadiness on feet  Rationale for Evaluation and Treatment Rehabilitation  SUBJECTIVE:                                                                                                                                                                                              SUBJECTIVE STATEMENT: Had a lot of pain and wooziness this AM but is getting better as the day goes on. Reports that exercising hurts his back. Denies HEP questions/concerns.   Pt accompanied by: self  PERTINENT HISTORY: medical history significant of AAA; CAD s/p CABG; chronic systolic CHF; afib s/p Maze, on Xarelto; HTN; HLD; PMR (currently on steroids); pre-diabetes; OSA on CPAP; PAD with lymphedema; and AS s/p TAVR   PAIN:  Are you having pain? Yes: NPRS scale: 4/10 Pain location: lumbar spine Pain description: aching, sore, sharp Aggravating factors: standing/walking Relieving factors: recliner, hot pack, stretching  PRECAUTIONS: Fall, pressure sores bilateral calcaneus right > left  PATIENT GOALS be able to stand unsupported for a time to perform ADL, walking if possible  OBJECTIVE:     TODAY'S TREATMENT: 09/18/21 Activity Comments  STS in II bars x6 Airex under feet d/t wounds on feet; min A and cueing for hand/foot placement, set up, and increased forward trunk lean  standing on airex in II bars:  Lateral weight shifts 10x Marching 10x  CGA-min A  Sitting with MHP on LB: ab set with ball 5x5" 2 sets  Red TB row 2x10 Red TB extension 2x10  Cues to avoid valsalva, scap retraction     HOME EXERCISE PROGRAM Last updated: 09/18/21 Access Code: [email or web address] URL: [email or web address] Date: 09/18/2021 Prepared by: [facility]  Exercises - Seated Heel Raise  - 1 x daily - 5 x weekly - 2 sets - 10 reps - Seated Toe Raise  - 1 x daily - 5 x weekly - 2 sets - 10 reps - Seated March  - 1 x daily - 5 x weekly - 2 sets - 10 reps - Seated Long Arc Quad  - 1 x daily - 5 x weekly - 2 sets - 10 reps - Seated Hip Adduction Squeeze with Ball  - 1 x daily - 5 x weekly - 2 sets - 10 reps - Row with Anchored Resistance on Swiss Ball  - 1 x daily - 5 x weekly - 2 sets - 10 reps - Seated Shoulder Extension and Scapular Retraction with Resistance on Swiss Ball  - 1 x daily - 5 x weekly - 2 sets - 10 reps   PATIENT EDUCATION: Education details: HEP update Person educated: Patient Education method: Explanation, Demonstration, Tactile cues, Verbal cues, and Handouts Education comprehension: verbalized understanding and returned demonstration     Below measures were taken at time of initial evaluation unless otherwise specified:   DIAGNOSTIC FINDINGS: IMPRESSION: Small areas of acute infarction in the right occipital lobe and left frontal lobe over the convexity. Mild to moderate acute infarct left operculum. Possible cerebral emboli.   Atrophy and mild chronic microvascular ischemia  COGNITION: Overall cognitive status: Within functional limits for tasks assessed Patient's historical recall is not very accurate, he reports his back injury presented x 9 months ago, whereas medical record notes encounter in 12/2019-01/2020 regarding L1 compression fx   SENSATION: Not tested  COORDINATION: Alt. Foot taps impaired due to dorsiflexion weakness  EDEMA:  Bilateral LE, wearing compression stockings. Uses lymphedema pump  MUSCLE TONE: DNT    POSTURE: rounded shoulders  LOWER EXTREMITY ROM:     Passive  Right Eval Left Eval  Hip flexion    Hip extension    Hip abduction    Hip adduction    Hip internal rotation    Hip external rotation    Knee flexion    Knee extension    Ankle dorsiflexion -10  -10  Ankle plantarflexion    Ankle inversion    Ankle eversion     (Blank rows = not tested)  LOWER EXTREMITY MMT:    MMT Right Eval Left Eval  Hip flexion 3 3  Hip extension    Hip abduction 2+ 2+  Hip adduction 3 3  Hip internal rotation    Hip external rotation    Knee flexion 3+ 3+  Knee extension 4- 4-  Ankle dorsiflexion 0 0  Ankle plantarflexion    Ankle inversion    Ankle eversion    (Blank rows = not tested)  BED MOBILITY:  Sit to supine Min A and Mod A Supine to sit Min A Pt notes his bed at home has assist rails  TRANSFERS: Assistive device utilized: Wheelchair (manual)  Sit to stand: CGA and Min A Stand to sit: CGA and Min A Chair to chair: CGA and Min A Floor:  DNT   W/C mobility Independent level surfaces  STAIRS:  DNT--pt has elevator to enter/exit home  GAIT: DNT due to safety concerns  FUNCTIONAL TESTs:  5 times sit to stand: unable, requires BUE and assist Static standing: dependent on BUE via RW (supported standing) Straight Leg Raise: 20+ degrees extensor lag  PATIENT SURVEYS:  FOTO to be assessed  PATIENT EDUCATION: Education details: assessment findings Person educated: Patient Education method: Explanation Education comprehension: verbalized understanding   HOME EXERCISE PROGRAM: To be initiated, demonstration/rehearsal of hip add isometric    GOALS: Goals reviewed with patient? Yes  SHORT TERM GOALS: Target date: 09/26/2021  Patient will be independent in HEP to improve functional outcomes Baseline: Goal status: IN PROGRESS  2.  Patient to demonstrate sit to supine w/ supervision using adaptive equipment to reduce level of assist from caregiver Baseline: Min-mod A for BLE to clear EOB Goal status: IN PROGRESS    LONG TERM GOALS: Target date: 10/24/2021  Demonstrate improved hip abduction strength to 3+/5 to improve standing stability Baseline: 2+/5 Goal status: IN PROGRESS  2.  Manifest improved BLE  strength as evidenced by ability to perform 5xSTS test in 30 sec using BUE Baseline: unable Goal status: IN PROGRESS  3.  Demonstrate static standing balance of Fair (no UE support) x 15 sec to improve safety with ADL performance Baseline: unable, dependent on BUE (Fair-) Goal status: IN PROGRESS  4.  Demonstrate transfers w/ set-up assist to increase independence and safety with mobility Baseline: CGA-min A Goal status: IN PROGRESS   ASSESSMENT:  CLINICAL IMPRESSION: Patient arrived to session with report of some mild-moderate LBP currently. Wearing R heel offloading cushioned boot which he reports is to be worn when up and about. Patient able to perform STS transfers with min A; modifications made to reduce pressure on foot wounds. Also able to perform short durations of standing activities today, however limited by LBP. Patient performed seated core strengthening activities with intermittent cueing for proper form. Overall patient tolerated session well despite some LBP. No complaints at end of session.    OBJECTIVE IMPAIRMENTS cardiopulmonary status limiting activity, decreased activity tolerance, decreased balance, decreased knowledge of use of DME, decreased mobility, difficulty walking, decreased ROM, decreased strength, impaired perceived functional ability, and pain.   ACTIVITY LIMITATIONS lifting, bending, standing, squatting, transfers, bed mobility, toileting, dressing, and locomotion level  PARTICIPATION LIMITATIONS: meal prep, cleaning, community activity, yard work, and home mgmt  PERSONAL FACTORS Age, Time since onset of injury/illness/exacerbation, and 3+ comorbidities: see PMH  are also affecting patient's functional outcome.   REHAB POTENTIAL: Good  CLINICAL DECISION MAKING: Evolving/moderate complexity  EVALUATION COMPLEXITY: Moderate  PLAN: PT FREQUENCY: 2x/week  PT DURATION: 8 weeks  PLANNED INTERVENTIONS: Therapeutic exercises, Therapeutic activity,  Neuromuscular re-education, Balance training, Gait training, Patient/Family education, Self Care, Joint mobilization, Stair training, Vestibular training, Canalith repositioning, Orthotic/Fit training, DME instructions, Aquatic Therapy, Dry Needling, Electrical stimulation, Wheelchair mobility training, Spinal mobilization, Cryotherapy, Moist heat, Taping, Ionotophoresis '4mg'$ /ml Dexamethasone, and Manual therapy  PLAN FOR NEXT SESSION: make sure patient brings boot for [place] activities; bed mobility with leg lifter, reassess HEP   Janene Harvey, PT, DPT 09/18/21 3:30 PM  [facility] at [facility] [address] [place], [place] [address] Phone # [phone] Fax # [phone]

## 2021-09-18 ENCOUNTER — Ambulatory Visit: Payer: Medicare Other | Admitting: Occupational Therapy

## 2021-09-18 ENCOUNTER — Ambulatory Visit: Payer: Medicare Other | Admitting: Physical Therapy

## 2021-09-18 ENCOUNTER — Encounter: Payer: Self-pay | Admitting: Physical Therapy

## 2021-09-18 ENCOUNTER — Ambulatory Visit: Payer: Medicare Other

## 2021-09-18 DIAGNOSIS — R4701 Aphasia: Secondary | ICD-10-CM

## 2021-09-18 DIAGNOSIS — M6281 Muscle weakness (generalized): Secondary | ICD-10-CM

## 2021-09-18 DIAGNOSIS — R41840 Attention and concentration deficit: Secondary | ICD-10-CM | POA: Diagnosis not present

## 2021-09-18 DIAGNOSIS — R2689 Other abnormalities of gait and mobility: Secondary | ICD-10-CM

## 2021-09-18 DIAGNOSIS — R41841 Cognitive communication deficit: Secondary | ICD-10-CM | POA: Diagnosis not present

## 2021-09-18 DIAGNOSIS — R262 Difficulty in walking, not elsewhere classified: Secondary | ICD-10-CM | POA: Diagnosis not present

## 2021-09-18 DIAGNOSIS — R208 Other disturbances of skin sensation: Secondary | ICD-10-CM | POA: Diagnosis not present

## 2021-09-18 DIAGNOSIS — R2681 Unsteadiness on feet: Secondary | ICD-10-CM

## 2021-09-18 DIAGNOSIS — I69351 Hemiplegia and hemiparesis following cerebral infarction affecting right dominant side: Secondary | ICD-10-CM

## 2021-09-18 NOTE — Therapy (Signed)
OUTPATIENT SPEECH LANGUAGE PATHOLOGY TREATMENT   Patient Name: [name] [name] MRN: [id number] DOB:[birth year], 88 y.o., male Today's Date: 09/18/2021  PCP: [name], MD REFERRING PROVIDER: Alysia Penna, MD   End of Session - 09/18/21 1541     Visit Number 3    Number of Visits 17    Date for SLP Re-Evaluation 10/27/21    SLP Start Time 58    SLP Stop Time  [id number]    SLP Time Calculation (min) 41 min    Activity Tolerance Patient tolerated treatment well              Past Medical History:  Diagnosis Date   AAA (abdominal aortic aneurysm) ([place])    Arthritis    Ascending aorta dilatation ([place]) 03/13/2012   Fusiform dilatation of the ascending thoracic aorta discovered during surgery, max diameter 4.2-4.3 cm   BPH (benign prostatic hypertrophy)    Chronic systolic heart failure ([place]) 03/03/2012   Chronic venous insufficiency    GERD (gastroesophageal reflux disease)    Glaucoma    Gout    History of kidney stones    Hyperlipidemia    Hypertension    Obesity (BMI 30-39.9)    Paroxysmal atrial fibrillation ([place]) 02/29/2012   Recurrent paroxysmal, new-onset    PMR (polymyalgia rheumatica) (HCC)    Polymyalgia rheumatica ([place]) 02/06/2019   Pre-diabetes    S/P CABG x 3 03/06/2012   LIMA to LAD, SVG to OM, SVG to RCA, EVH via right thigh   S/P Maze operation for atrial fibrillation 03/06/2012   Complete bilateral lesions set using bipolar radiofrequency and cryothermy ablation with clipping of LA appendage   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   s/p TAVR with a 26 mm Edwards via the left subclavian by Drs Buena Irish and Bartle   Severe aortic stenosis 09/29/2019   Sleep apnea    USES CPAP NIGHTLY   Past Surgical History:  Procedure Laterality Date   ABDOMINAL AORTOGRAM W/LOWER EXTREMITY N/A 10/26/2020   Procedure: ABDOMINAL AORTOGRAM W/LOWER EXTREMITY;  Surgeon: Broadus John, MD;  Location: [facility];  Service:  Cardiovascular;  Laterality: N/A;   CARDIAC CATHETERIZATION     CATARACT EXTRACTION, BILATERAL     with lens implants   CHOLECYSTECTOMY N/A 03/24/2012   Procedure: LAPAROSCOPIC CHOLECYSTECTOMY WITH INTRAOPERATIVE CHOLANGIOGRAM;  Surgeon: Zenovia Jarred, MD;  Location: [facility];  Service: General;  Laterality: N/A;   CORONARY ARTERY BYPASS GRAFT N/A 03/06/2012   Procedure: CORONARY ARTERY BYPASS GRAFTING (CABG);  Surgeon: Rexene Alberts, MD;  Location: [facility];  Service: Open Heart Surgery;  Laterality: N/A;   ENDOVEIN HARVEST OF GREATER SAPHENOUS VEIN Right 03/06/2012   Procedure: ENDOVEIN HARVEST OF GREATER SAPHENOUS VEIN;  Surgeon: Rexene Alberts, MD;  Location: [facility];  Service: Open Heart Surgery;  Laterality: Right;   INTRAOPERATIVE TRANSESOPHAGEAL ECHOCARDIOGRAM N/A 03/06/2012   Procedure: INTRAOPERATIVE TRANSESOPHAGEAL ECHOCARDIOGRAM;  Surgeon: Rexene Alberts, MD;  Location: [facility];  Service: Open Heart Surgery;  Laterality: N/A;   IR KYPHO LUMBAR INC FX REDUCE BONE BX UNI/BIL CANNULATION INC/IMAGING  02/23/2020   LEFT HEART CATH Right 03/02/2012   Procedure: LEFT HEART CATH;  Surgeon: Sherren Mocha, MD;  Location: [facility];  Service: Cardiovascular;  Laterality: Right;   MAZE N/A 03/06/2012   Procedure: MAZE;  Surgeon: Rexene Alberts, MD;  Location: [facility];  Service: Open Heart Surgery;  Laterality: N/A;   PERIPHERAL VASCULAR INTERVENTION Right 10/26/2020   Procedure: PERIPHERAL  VASCULAR INTERVENTION;  Surgeon: Broadus John, MD;  Location: [facility];  Service: Cardiovascular;  Laterality: Right;   PROSTATECTOMY     partial   RIGHT/LEFT HEART CATH AND CORONARY/GRAFT ANGIOGRAPHY N/A 09/29/2019   Procedure: RIGHT/LEFT HEART CATH AND CORONARY/GRAFT ANGIOGRAPHY;  Surgeon: Adrian Prows, MD;  Location: [facility];  Service: Cardiovascular;  Laterality: N/A;   TEE WITHOUT CARDIOVERSION N/A 11/10/2019   Procedure: TRANSESOPHAGEAL ECHOCARDIOGRAM (TEE);  Surgeon: Burnell Blanks,  MD;  Location: [facility];  Service: Open Heart Surgery;  Laterality: N/A;   TRANSCATHETER AORTIC VALVE REPLACEMENT, TRANSFEMORAL  11/10/2019   Patient Active Problem List   Diagnosis Date Noted   Left middle cerebral artery stroke ([place]) 05/31/2021   Acute CVA (cerebrovascular accident) ([place]) 05/25/2021   DNR (do not resuscitate) 05/25/2021   Osteoporosis 05/05/2021   Gouty arthritis 02/13/2021   Morbid obesity ([place]) 02/13/2021   Abdominal aortic aneurysm without rupture ([place]) 11/22/2020   Acute renal failure syndrome ([place]) 11/22/2020   Low back pain 11/22/2020   Skin tear of right upper arm without complication 31/54/0086   Pressure injury of skin 10/23/2020   Urinary tract infection with hematuria 10/22/2020   Encephalopathy 10/22/2020   AKI (acute kidney injury) ([place])    Overweight (BMI 25.0-29.9) 08/19/2020   Vertebral fracture, osteoporotic ([place]) 08/18/2020   T8 vertebral fracture ([place]) 08/17/2020   Thrombocytopenia ([place]) 08/17/2020   CKD (chronic kidney disease) stage 3, GFR 30-59 ml/min (HCC) 08/17/2020   Elevated troponin 08/17/2020   Pain due to onychomycosis of toenails of both feet 07/27/2020   Callus of heel 07/27/2020   Bifascicular block 11/11/2019   Acute on chronic diastolic heart failure ([place]) 11/10/2019   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   Severe aortic stenosis 09/29/2019   Aortic valve disorder 76/19/5093   Systolic heart failure ([place]) 03/11/2019   Thrombophilia ([place]) 02/26/2019   Muscle pain 02/11/2019   Weakness 02/06/2019   Polymyalgia rheumatica ([place]) 02/06/2019   Shoulder joint pain 02/02/2019   Anemia 01/26/2019   Malignant melanoma of skin ([place]) 01/26/2019   Pain in right leg 01/26/2019   DDD (degenerative disc disease), cervical 05/28/2018   Encounter for general adult medical examination without abnormal findings 11/19/2017   Carotid artery occlusion 07/03/2017   Hearing loss 07/03/2017   Fall 06/19/2017   Injury of  upper extremity 06/19/2017   Neck pain 06/19/2017   Headache 06/19/2017   Visual disturbance 06/19/2017   Ganglion of hand 05/08/2017   Dilatation of aorta ([place]) 02/14/2017   Plantar fascial fibromatosis 02/14/2017   Cardiac murmur, unspecified 02/27/2016   Benign neoplasm of colon 08/24/2015   Dementia ([place]) 08/24/2015   Influenza due to unidentified influenza virus with other respiratory manifestations 03/15/2015   Epistaxis 12/01/2014   OSA (obstructive sleep apnea) 11/18/2014   Gastro-esophageal reflux disease with esophagitis 10/29/2014   Angina pectoris ([place]) 05/04/2014   Abnormal feces 07/31/2013   Other specified disorders of gingiva and edentulous alveolar ridge 07/31/2013   Spontaneous ecchymosis 10/10/2012   Thoracic ascending aortic aneurysm ([place]) 09/29/2012   Microscopic hematuria 04/11/2012   Proteinuria 04/11/2012   S/P laparoscopic cholecystectomy 04/09/2012   S/P CABG x 3 03/06/2012   S/P Maze operation for atrial fibrillation 03/06/2012   Paroxysmal atrial fibrillation ([place]) 02/29/2012   Bitten or stung by nonvenomous insect and other nonvenomous arthropods, initial encounter 12/03/2011   Depression screening 10/17/2011   CAD (coronary artery disease), native coronary artery    Hyperlipidemia  Hypertension    Chronic venous insufficiency    Gout    Benign prostatic hyperplasia    Post-traumatic stress disorder 03/13/2011   Pain in left leg 12/08/2010   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   Fatigue 08/08/2010   Retention of urine 04/18/2010   Constipation 07/05/2009   Kidney stone 07/05/2009   ED (erectile dysfunction) of organic origin 12/14/2008   Impaired fasting glucose 12/14/2008   Vitamin B deficiency 12/14/2008    ONSET DATE: 05/25/21   REFERRING DIAG: O24.235 (ICD-10-CM) - Left middle cerebral artery stroke   THERAPY DIAG:  Aphasia  Cognitive communication deficit  Rationale for Evaluation and Treatment Rehabilitation  SUBJECTIVE:    SUBJECTIVE STATEMENT: "Here's my homework and I just noticed another error." Pt accompanied by: self  PERTINENT HISTORY:  [name] [name] is a 88 y.o. male with medical history significant of AAA; CAD s/p CABG; chronic systolic CHF; afib s/p Maze, on Xarelto; HTN; HLD; PMR (currently on steroids); pre-diabetes; OSA on CPAP; PAD with lymphedema; and AS s/p TAVR presenting with stroke-like symptoms on 05/25/21. FAmily had a meeting with bad news.  Shortly thereafter, he was trembling on one side with slurred speech.  His episode lasted less than a minute but the speech difficulty persisted. He completed home health PT OT and speech and arrives at OP rehab for his continuation of rehabilitative care.  Inpatient rehab discharge note: "Patient has made excellent gains and has met 5 of 5 long-term goals this admission due to improved motor speech, word finding skills, complex problem solving, and attention. Pt is completing high level cognitive-linguistic tasks with mod I-to-sup A in regards to problem solving, attention, and functional recall. Pt demonstrates improved self-monitoring and self correction of verbal errors when communicating at the phrase/sentence level. Pt continues to exhibit mild higher level word finding, spelling, motor speech production, and alternating attention deficits. Patient and family education is complete and patient to discharge at overall mod I-to-sup A level. Patient's care partner is independent to provide the necessary physical and cognitive assistance at discharge. Pt would continue to benefit from skilled ST services in order to maximize functional independence and reduce burden of care, with continued skilled ST services at discharge."  PAIN:  Are you having pain? Yes: NPRS scale: 3/10 Pain location: lumbar spine Pain description: aching, sore, sharp Aggravating factors: standing/walking Relieving factors: recliner  PATIENT GOALS  Speak intelligently, write with better  spelling ability  OBJECTIVE:   TODAY'S TREATMENT:  09/18/21: Mycal was able to communicate his intended message with success using slowed speech rate, circumlocution, and synonym strategies. He was aware of errors 100% of the time. SLP assisted pt with word level aphasia (written) with cues for error words and then min-mod cues for letter accuracy. Homework for written aphasia at the word/phrase level was provided.  09/13/21: Assessed pt's writing skills for language ability today. Rondall made aphasic errors in all sets of responses (divergent naming tasks). Pt aware of errors 75% of the time. With error words SLP provided "fill in the blank", and pt with correct response 80% of the time. SLP provided him practice with written language accuracy, with instructions to wife to assist if he has difficulty with the same means (fill in the blank).  08/29/21: Discussed evaluation results, probable goals, and discussed pt's need to write more and provide himself more opportunity for practice.   PATIENT EDUCATION: Education details: see above in "today's treatment" Person educated: Patient Education method: Explanation Education comprehension: verbalized understanding and needs further  education    PATIENT REPORTED OUTCOME MEASURES (PROM): Communication Participation Item Bank: to be completed next session and Communication Effectiveness Survey: to be completed next session    GOALS: Goals reviewed with patient? Yes  SHORT TERM GOALS: Target date: 09/26/2021    Pt will name average 8 items/60 seconds in simple category important to pt in 3 sessions Baseline: Goal status: Ongoing  2.  Pt will write to-do lists, errand lists (1-3 word responses) functionally in 3 sessions Baseline:  Goal status: Ongoing  3.  Pt will participate in 8 minutes functional mod complex conversation with modified independence in 2 sessions Baseline: 09-18-21 Goal status: Ongoing    LONG TERM GOALS: Target date:  10/24/2021   Pt will write sentences of 5+ words, functionally, with rare min A in 3 sessions Baseline:  Goal status: Ongoing  2.   Pt will participate in 10 minutes functional mod complex-complex conversation with modified independence in 3 sessions Baseline:  Goal status: Ongoing   ASSESSMENT:  CLINICAL IMPRESSION: Patient is a 88 y.o. male who was seen today for speech and language therapy following CVA in May 2023. Today pt again demonstrated written aphasia at word level, and was given homework to target this. Pt's mod complex verbal expression was functional with consistent extra time. See TX NOTE.  OBJECTIVE IMPAIRMENTS include expressive language and aphasia. These impairments are limiting patient from ADLs/IADLs and effectively communicating at home and in community. Factors affecting potential to achieve goals and functional outcome are  none . Patient will benefit from skilled SLP services to address above impairments and improve overall function.  REHAB POTENTIAL: Good  PLAN: SLP FREQUENCY: 2x/week  SLP DURATION: 8 weeks  PLANNED INTERVENTIONS: Language facilitation, Environmental controls, Cueing hierachy, Internal/external aids, Oral motor exercises, Functional tasks, Multimodal communication approach, SLP instruction and feedback, Compensatory strategies, and Patient/family education    [facility], [place] 09/18/2021, 4:27 PM

## 2021-09-18 NOTE — Therapy (Signed)
OUTPATIENT OCCUPATIONAL THERAPY  TREATMENT NOTE  Patient Name: LOVELACE CERVENY MRN: [id number] DOB:1926/03/16, 88 y.o., male Today's Date: 09/18/2021  PCP: [name], MD REFERRING PROVIDER: Charlett Blake, MD    OT End of Session - 09/18/21 1426     Visit Number 4    Number of Visits 17    Date for OT Re-Evaluation 10/27/21    Authorization Type BCBS Medicare    OT Start Time 1404    OT Stop Time 1446    OT Time Calculation (min) 42 min    Activity Tolerance Patient tolerated treatment well    Behavior During Therapy WFL for tasks assessed/performed             Past Medical History:  Diagnosis Date   AAA (abdominal aortic aneurysm) ([place])    Arthritis    Ascending aorta dilatation ([place]) 03/13/2012   Fusiform dilatation of the ascending thoracic aorta discovered during surgery, max diameter 4.2-4.3 cm   BPH (benign prostatic hypertrophy)    Chronic systolic heart failure ([place]) 03/03/2012   Chronic venous insufficiency    GERD (gastroesophageal reflux disease)    Glaucoma    Gout    History of kidney stones    Hyperlipidemia    Hypertension    Obesity (BMI 30-39.9)    Paroxysmal atrial fibrillation ([place]) 02/29/2012   Recurrent paroxysmal, new-onset    PMR (polymyalgia rheumatica) (HCC)    Polymyalgia rheumatica ([place]) 02/06/2019   Pre-diabetes    S/P CABG x 3 03/06/2012   LIMA to LAD, SVG to OM, SVG to RCA, EVH via right thigh   S/P Maze operation for atrial fibrillation 03/06/2012   Complete bilateral lesions set using bipolar radiofrequency and cryothermy ablation with clipping of LA appendage   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   s/p TAVR with a 26 mm Edwards via the left subclavian by Drs Buena Irish and Bartle   Severe aortic stenosis 09/29/2019   Sleep apnea    USES CPAP NIGHTLY   Past Surgical History:  Procedure Laterality Date   ABDOMINAL AORTOGRAM W/LOWER EXTREMITY N/A 10/26/2020   Procedure: ABDOMINAL AORTOGRAM W/LOWER  EXTREMITY;  Surgeon: Broadus John, MD;  Location: [facility];  Service: Cardiovascular;  Laterality: N/A;   CARDIAC CATHETERIZATION     CATARACT EXTRACTION, BILATERAL     with lens implants   CHOLECYSTECTOMY N/A 03/24/2012   Procedure: LAPAROSCOPIC CHOLECYSTECTOMY WITH INTRAOPERATIVE CHOLANGIOGRAM;  Surgeon: Zenovia Jarred, MD;  Location: [facility];  Service: General;  Laterality: N/A;   CORONARY ARTERY BYPASS GRAFT N/A 03/06/2012   Procedure: CORONARY ARTERY BYPASS GRAFTING (CABG);  Surgeon: Rexene Alberts, MD;  Location: [facility];  Service: Open Heart Surgery;  Laterality: N/A;   ENDOVEIN HARVEST OF GREATER SAPHENOUS VEIN Right 03/06/2012   Procedure: ENDOVEIN HARVEST OF GREATER SAPHENOUS VEIN;  Surgeon: Rexene Alberts, MD;  Location: [facility];  Service: Open Heart Surgery;  Laterality: Right;   INTRAOPERATIVE TRANSESOPHAGEAL ECHOCARDIOGRAM N/A 03/06/2012   Procedure: INTRAOPERATIVE TRANSESOPHAGEAL ECHOCARDIOGRAM;  Surgeon: Rexene Alberts, MD;  Location: [facility];  Service: Open Heart Surgery;  Laterality: N/A;   IR KYPHO LUMBAR INC FX REDUCE BONE BX UNI/BIL CANNULATION INC/IMAGING  02/23/2020   LEFT HEART CATH Right 03/02/2012   Procedure: LEFT HEART CATH;  Surgeon: Sherren Mocha, MD;  Location: [facility];  Service: Cardiovascular;  Laterality: Right;   MAZE N/A 03/06/2012   Procedure: MAZE;  Surgeon: Rexene Alberts, MD;  Location: [facility];  Service: Open Heart Surgery;  Laterality: N/A;   PERIPHERAL VASCULAR INTERVENTION Right 10/26/2020   Procedure: PERIPHERAL VASCULAR INTERVENTION;  Surgeon: Broadus John, MD;  Location: [facility];  Service: Cardiovascular;  Laterality: Right;   PROSTATECTOMY     partial   RIGHT/LEFT HEART CATH AND CORONARY/GRAFT ANGIOGRAPHY N/A 09/29/2019   Procedure: RIGHT/LEFT HEART CATH AND CORONARY/GRAFT ANGIOGRAPHY;  Surgeon: Adrian Prows, MD;  Location: [facility];  Service: Cardiovascular;  Laterality: N/A;   TEE WITHOUT CARDIOVERSION N/A 11/10/2019    Procedure: TRANSESOPHAGEAL ECHOCARDIOGRAM (TEE);  Surgeon: Burnell Blanks, MD;  Location: [facility];  Service: Open Heart Surgery;  Laterality: N/A;   TRANSCATHETER AORTIC VALVE REPLACEMENT, TRANSFEMORAL  11/10/2019   Patient Active Problem List   Diagnosis Date Noted   Left middle cerebral artery stroke ([place]) 05/31/2021   Acute CVA (cerebrovascular accident) ([place]) 05/25/2021   DNR (do not resuscitate) 05/25/2021   Osteoporosis 05/05/2021   Gouty arthritis 02/13/2021   Morbid obesity ([place]) 02/13/2021   Abdominal aortic aneurysm without rupture ([place]) 11/22/2020   Acute renal failure syndrome ([place]) 11/22/2020   Low back pain 11/22/2020   Skin tear of right upper arm without complication 29/92/4268   Pressure injury of skin 10/23/2020   Urinary tract infection with hematuria 10/22/2020   Encephalopathy 10/22/2020   AKI (acute kidney injury) ([place])    Overweight (BMI 25.0-29.9) 08/19/2020   Vertebral fracture, osteoporotic ([place]) 08/18/2020   T8 vertebral fracture ([place]) 08/17/2020   Thrombocytopenia ([place]) 08/17/2020   CKD (chronic kidney disease) stage 3, GFR 30-59 ml/min (HCC) 08/17/2020   Elevated troponin 08/17/2020   Pain due to onychomycosis of toenails of both feet 07/27/2020   Callus of heel 07/27/2020   Bifascicular block 11/11/2019   Acute on chronic diastolic heart failure ([place]) 11/10/2019   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   Severe aortic stenosis 09/29/2019   Aortic valve disorder 34/19/6222   Systolic heart failure ([place]) 03/11/2019   Thrombophilia ([place]) 02/26/2019   Muscle pain 02/11/2019   Weakness 02/06/2019   Polymyalgia rheumatica ([place]) 02/06/2019   Shoulder joint pain 02/02/2019   Anemia 01/26/2019   Malignant melanoma of skin ([place]) 01/26/2019   Pain in right leg 01/26/2019   DDD (degenerative disc disease), cervical 05/28/2018   Encounter for general adult medical examination without abnormal findings 11/19/2017   Carotid artery  occlusion 07/03/2017   Hearing loss 07/03/2017   Fall 06/19/2017   Injury of upper extremity 06/19/2017   Neck pain 06/19/2017   Headache 06/19/2017   Visual disturbance 06/19/2017   Ganglion of hand 05/08/2017   Dilatation of aorta ([place]) 02/14/2017   Plantar fascial fibromatosis 02/14/2017   Cardiac murmur, unspecified 02/27/2016   Benign neoplasm of colon 08/24/2015   Dementia ([place]) 08/24/2015   Influenza due to unidentified influenza virus with other respiratory manifestations 03/15/2015   Epistaxis 12/01/2014   OSA (obstructive sleep apnea) 11/18/2014   Gastro-esophageal reflux disease with esophagitis 10/29/2014   Angina pectoris ([place]) 05/04/2014   Abnormal feces 07/31/2013   Other specified disorders of gingiva and edentulous alveolar ridge 07/31/2013   Spontaneous ecchymosis 10/10/2012   Thoracic ascending aortic aneurysm ([place]) 09/29/2012   Microscopic hematuria 04/11/2012   Proteinuria 04/11/2012   S/P laparoscopic cholecystectomy 04/09/2012   S/P CABG x 3 03/06/2012   S/P Maze operation for atrial fibrillation 03/06/2012   Paroxysmal atrial fibrillation ([place]) 02/29/2012   Bitten or stung by nonvenomous insect and other nonvenomous arthropods, initial encounter 12/03/2011  Depression screening 10/17/2011   CAD (coronary artery disease), native coronary artery    Hyperlipidemia    Hypertension    Chronic venous insufficiency    Gout    Benign prostatic hyperplasia    Post-traumatic stress disorder 03/13/2011   Pain in left leg 12/08/2010   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   Fatigue 08/08/2010   Retention of urine 04/18/2010   Constipation 07/05/2009   Kidney stone 07/05/2009   ED (erectile dysfunction) of organic origin 12/14/2008   Impaired fasting glucose 12/14/2008   Vitamin B deficiency 12/14/2008    ONSET DATE: 05/25/2021  REFERRING DIAG: T03.546 (ICD-10-CM) - Left middle cerebral artery stroke   THERAPY DIAG:  Hemiplegia and hemiparesis following  cerebral infarction affecting right dominant side (HCC)  Attention and concentration deficit  Other disturbances of skin sensation  Muscle weakness (generalized)  Rationale for Evaluation and Treatment Rehabilitation  SUBJECTIVE:   SUBJECTIVE STATEMENT: Pt reports that it is not always easy to recall how to spell "these words" and that it requires some extra thought. Pt accompanied by: self and significant other (dropped off pt)  PERTINENT HISTORY:  H/o AAA, CAD with CABG, compression fractures, chronic anemia followed by hematology services, chronic systolic congestive heart failure, A-fib/AF status post MAZE on Xarelto, hypertension, hyperlipidemia, PMR maintained on steroids, prediabetes, early dementia maintained on Namenda, chronic lower extremity wounds with Unna boot OSA with CPAP, PAD with lymphedema status post balloon angioplasty of posterior tibial artery October 2022 recent UTI placed on antibiotic therapy.  He does have a personal care attendant Monday Wednesday Friday for 3 hours.  Modified independent transfers power wheelchair bound x3 to 4 months prior to that ambulating short distances with a rollator.  Present 05/25/2021 with right-sided weakness and aphasia of acute onset.  Cranial CT scan showed small left MCA territory cortical infarct within the mid to posterior left frontal lobe.  CT angiogram head and neck no large vessel occlusion.   PRECAUTIONS: Fall  PAIN:  Are you having pain? Yes: NPRS scale: 5/10 Pain location: mid back Pain description: constant; dull, achy Aggravating factors: standing Relieving factors: injections, repositioning  FALLS: Has patient fallen in last 6 months? Yes. Number of falls 4, has not fallen recently, per pt report  LIVING ENVIRONMENT: Lives with: lives with their spouse Lives in: House/apartment Stairs:  Yes: steps on front and back of house, however has an elevator at the back, steps to 2nd floor but does not need to go  upstairs Has following equipment at home: Environmental consultant - 2 wheeled, Environmental consultant - 4 wheeled, Wheelchair (power), Wheelchair (manual), shower chair, bed side commode, Grab bars, and elevator to access home from outside, hand held shower head  PLOF:  Has a caregiver 3x/week that assists with ADLs, d/t spinal compression fx pt has become more w/c bound in 5-6 months due to    [place] to talk properly; to write and to spell  OBJECTIVE:   HAND DOMINANCE: Right  ADLs: Dressing: Setup w/ UB dressing, wife assists w/ pants, socks, shoes Grooming: Mod I from seated position Eating: reports great difficulty using a knife to cut food, wife frequently cuts foods for him Toileting: Occ difficulty pulling pants back up after toileting, leans forward to complete hygiene Bathing: Supervision/setup  Tub Shower transfers: caregiver assist w/ transfers in/out of shower providing SPV and occ Min A Equipment: Shower seat with back, Grab bars, Walk in shower, bed side commode, Reacher, and Sock aid  IADLs: Financial management: Reports difficulty with writing a  check, math, spelling, but did most of financial management Handwriting: 50% legible and difficulty with spelling that has gotten worse since CVA  MOBILITY STATUS: Needs Assist: Mod I stand pivot from w/c, Supervision for shower transfers  FUNCTIONAL OUTCOME MEASURES: FOTO: TBD at next session  UPPER EXTREMITY MMT:     MMT Right eval Left eval  Shoulder flexion 4+/5 4+/5  Shoulder abduction    Shoulder adduction    Shoulder extension    Shoulder internal rotation    Shoulder external rotation    Middle trapezius    Lower trapezius    Elbow flexion 4+/5 4+/5  Elbow extension 4+/5 4+/5  Wrist flexion    Wrist extension    Wrist ulnar deviation    Wrist radial deviation    Wrist pronation    Wrist supination    (Blank rows = not tested)  HAND FUNCTION: Grip strength: Right: 30 lbs; Left: 30 lbs  COORDINATION: 9 Hole Peg test: Right:  1:06.53 sec; Left: 34.53 sec  SENSATION: Reports numbness and difficulty maintaining grasp on spoon/fork and writing utensil  -------------------------------------------------------------------------------------------------------------------------------------------------------- (objective measures above completed at initial evaluation unless otherwise dated)  TODAY'S TREATMENT:  Alternating attention: completed table top task of connecting colored dots in order of the colors listed on the left side of the page.  Pt requiring increased time and multiple attempts when connecting dots to ensure not repeating the same dots.  Discussed functional carryover to balancing a checkbook, following a recipe while cooking, or even filling up a water cup while talking to a friend. Check balancing activity: focus on handwriting/alternating attention/mental math to complete task.  Pt requiring initial repeated instructions and demonstration to complete first item in ledger.  Pt making 3 errors across 6 items, but able to correct without additional cues.  Pt demonstrating no spelling errors (however had a guide to copy from), but making 2 mental math errors. Discussed use of calculator or arranging numbers to decrease errors from mental math.   Handwriting: OT reiterated education from previous session regarding difficulty w/ spelling being due to aphasia vs due to decreased coordination/praxis deficits. Also discussed options for activities to practice handwriting and letter formation at home as well as reiterating coordination HEP from previous session.   PATIENT EDUCATION: Ongoing condition-specific education related to therapeutic interventions completed this session Person educated: Patient Education method: Explanation, Demonstration, and Handouts Education comprehension: verbalized understanding   HOME EXERCISE PROGRAM: Access Code: [email or web address] URL: [email or web address] Date:  09/11/2021 Prepared by: [facility]  Exercises - Putty Squeezes  - 1 x daily - 1 sets - 10 reps - Rolling Putty on Table  - 1 x daily - 1 sets - 10 reps - Thumb Opposition with Putty  - 1 x daily - 1 sets - 10 reps - Key Pinch with Putty  - 1 x daily - 1 sets - 10 reps - Tip Pinch with Putty  - 1 x daily - 1 sets - 10 reps - Removing Marbles from Putty  - 1 x daily - 1 sets - 10 reps  GOALS: Goals reviewed with patient? Yes  SHORT TERM GOALS: Target date: 09/29/2021  Pt will verbalize understanding of adapted strategies and/or potential AE needs to increase ease, safety, and independence w/ LB dressing. Baseline: Goal status: IN PROGRESS  2.  Pt will be independent with FMC/GMC and strengthening HEP to increase independence with handwriting and cutting of foods. Baseline:  Goal status: IN PROGRESS  3. Pt will demonstrate improved fine motor coordination for ADLs as evidenced by decreasing 9 hole peg test score for RUE by 5 secs  Baseline: 9 Hole Peg test: Right: 1:06.53 sec; Left: 34.53 sec  Goal status: IN PROGRESS   LONG TERM GOALS: Target date: 10/27/2021  Pt will report ability to complete LB dressing with supervision for standing balance and use of AE as needed. Baseline:  Goal status: IN PROGRESS  2.  Pt will demonstrated improved standing balance to stand for 1 min with alternating UE to allow for increased independence with LB dressing/clothing management post toileting. Baseline:  Goal status: IN PROGRESS  3.  Pt will demonstrated improved working memory and recall during check writing/balancing check book by making 0 errors. Baseline:  Goal status: IN PROGRESS  4.  Pt will demonstrate improved fine motor coordination for ADLs as evidenced by decreasing 9 hole peg test score for RUE by 10 secs Baseline: 9 Hole Peg test: Right: 1:06.53 sec; Left: 34.53 sec Goal status: IN PROGRESS  5.  Pt will write a short paragraph with 90%  legibility and no significant errors in spelling as evidenced by </= to 3 spelling errors. Baseline:  Goal status: IN PROGRESS   ASSESSMENT:  CLINICAL IMPRESSION: Treatment session with focus on handwriting, spelling, and mental math as well as alternating attention during table top tasks as this continues to be one of the biggest goals for Mr. Lamson. After observing handwriting, difficulty w/ spelling is likely due to aphasia, as smoothness of movements, fluency, and letter formation look good. Reviewed aphasia impacting errors made during check organization, writing, and mental math as well as educated on additional functional tasks that may be impacted by impaired alternating attention.  PERFORMANCE DEFICITS in functional skills including ADLs, IADLs, coordination, sensation, ROM, strength, pain, FMC, GMC, balance, body mechanics, endurance, decreased knowledge of use of DME, and UE functional use, cognitive skills including attention, problem solving, sequencing, and thought, and psychosocial skills including coping strategies, environmental adaptation, and routines and behaviors.   IMPAIRMENTS are limiting patient from ADLs and IADLs.   COMORBIDITIES may have co-morbidities  that affects occupational performance. Patient will benefit from skilled OT to address above impairments and improve overall function.  PLAN: OT FREQUENCY: 2x/week  OT DURATION: 8 weeks  PLANNED INTERVENTIONS: self care/ADL training, therapeutic exercise, therapeutic activity, neuromuscular re-education, balance training, functional mobility training, moist heat, cryotherapy, patient/family education, cognitive remediation/compensation, psychosocial skills training, energy conservation, coping strategies training, and DME and/or AE instructions  RECOMMENDED OTHER SERVICES: N/A  CONSULTED AND AGREED WITH PLAN OF CARE: Patient  PLAN FOR NEXT SESSION: review coordination HEP if needed, [place] tasks to address  handwriting and other alternating attention tasks   Lorenzo, [place], OTR/L 09/18/2021, 2:53 PM

## 2021-09-19 NOTE — Therapy (Signed)
OUTPATIENT PHYSICAL THERAPY NEURO TREATMENT   Patient Name: [name] [name] MRN: [id number] DOB:February 21, 1926, 88 y.o., male Today's Date: 09/20/2021   PCP: [name], MD REFERRING PROVIDER: Charlett Blake, MD    PT End of Session - 09/20/21 1445     Visit Number 4    Number of Visits 16    Date for PT Re-Evaluation 11/07/21    Authorization Type BCBS Medicare    Authorization Time Period no VL, no auth    PT Start Time 1402    PT Stop Time 1443    PT Time Calculation (min) 41 min    Equipment Utilized During Treatment Gait belt;Other (comment)    Activity Tolerance Patient tolerated treatment well    Behavior During Therapy [facility] for tasks assessed/performed                Past Medical History:  Diagnosis Date   AAA (abdominal aortic aneurysm) ([place])    Arthritis    Ascending aorta dilatation ([place]) 03/13/2012   Fusiform dilatation of the ascending thoracic aorta discovered during surgery, max diameter 4.2-4.3 cm   BPH (benign prostatic hypertrophy)    Chronic systolic heart failure ([place]) 03/03/2012   Chronic venous insufficiency    GERD (gastroesophageal reflux disease)    Glaucoma    Gout    History of kidney stones    Hyperlipidemia    Hypertension    Obesity (BMI 30-39.9)    Paroxysmal atrial fibrillation ([place]) 02/29/2012   Recurrent paroxysmal, new-onset    PMR (polymyalgia rheumatica) (HCC)    Polymyalgia rheumatica ([place]) 02/06/2019   Pre-diabetes    S/P CABG x 3 03/06/2012   LIMA to LAD, SVG to OM, SVG to RCA, EVH via right thigh   S/P Maze operation for atrial fibrillation 03/06/2012   Complete bilateral lesions set using bipolar radiofrequency and cryothermy ablation with clipping of LA appendage   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   s/p TAVR with a 26 mm Edwards via the left subclavian by Drs Buena Irish and Bartle   Severe aortic stenosis 09/29/2019   Sleep apnea    USES CPAP NIGHTLY   Past Surgical History:   Procedure Laterality Date   ABDOMINAL AORTOGRAM W/LOWER EXTREMITY N/A 10/26/2020   Procedure: ABDOMINAL AORTOGRAM W/LOWER EXTREMITY;  Surgeon: Broadus John, MD;  Location: [facility];  Service: Cardiovascular;  Laterality: N/A;   CARDIAC CATHETERIZATION     CATARACT EXTRACTION, BILATERAL     with lens implants   CHOLECYSTECTOMY N/A 03/24/2012   Procedure: LAPAROSCOPIC CHOLECYSTECTOMY WITH INTRAOPERATIVE CHOLANGIOGRAM;  Surgeon: Zenovia Jarred, MD;  Location: [facility];  Service: General;  Laterality: N/A;   CORONARY ARTERY BYPASS GRAFT N/A 03/06/2012   Procedure: CORONARY ARTERY BYPASS GRAFTING (CABG);  Surgeon: Rexene Alberts, MD;  Location: [place];  Service: Open Heart Surgery;  Laterality: N/A;   ENDOVEIN HARVEST OF GREATER SAPHENOUS VEIN Right 03/06/2012   Procedure: ENDOVEIN HARVEST OF GREATER SAPHENOUS VEIN;  Surgeon: Rexene Alberts, MD;  Location: [facility];  Service: Open Heart Surgery;  Laterality: Right;   INTRAOPERATIVE TRANSESOPHAGEAL ECHOCARDIOGRAM N/A 03/06/2012   Procedure: INTRAOPERATIVE TRANSESOPHAGEAL ECHOCARDIOGRAM;  Surgeon: Rexene Alberts, MD;  Location: [facility];  Service: Open Heart Surgery;  Laterality: N/A;   IR KYPHO LUMBAR INC FX REDUCE BONE BX UNI/BIL CANNULATION INC/IMAGING  02/23/2020   LEFT HEART CATH Right 03/02/2012   Procedure: LEFT HEART CATH;  Surgeon: Sherren Mocha, MD;  Location: [facility];  Service:  Cardiovascular;  Laterality: Right;   MAZE N/A 03/06/2012   Procedure: MAZE;  Surgeon: Rexene Alberts, MD;  Location: [place];  Service: Open Heart Surgery;  Laterality: N/A;   PERIPHERAL VASCULAR INTERVENTION Right 10/26/2020   Procedure: PERIPHERAL VASCULAR INTERVENTION;  Surgeon: Broadus John, MD;  Location: [place] [facility];  Service: Cardiovascular;  Laterality: Right;   PROSTATECTOMY     partial   RIGHT/LEFT HEART CATH AND CORONARY/GRAFT ANGIOGRAPHY N/A 09/29/2019   Procedure: RIGHT/LEFT HEART CATH AND CORONARY/GRAFT ANGIOGRAPHY;  Surgeon: Adrian Prows,  MD;  Location: [place] [facility];  Service: Cardiovascular;  Laterality: N/A;   TEE WITHOUT CARDIOVERSION N/A 11/10/2019   Procedure: TRANSESOPHAGEAL ECHOCARDIOGRAM (TEE);  Surgeon: Burnell Blanks, MD;  Location: [place];  Service: Open Heart Surgery;  Laterality: N/A;   TRANSCATHETER AORTIC VALVE REPLACEMENT, TRANSFEMORAL  11/10/2019   Patient Active Problem List   Diagnosis Date Noted   Left middle cerebral artery stroke ([place]) 05/31/2021   Acute CVA (cerebrovascular accident) ([place]) 05/25/2021   DNR (do not resuscitate) 05/25/2021   Osteoporosis 05/05/2021   Gouty arthritis 02/13/2021   Morbid obesity ([place]) 02/13/2021   Abdominal aortic aneurysm without rupture ([place]) 11/22/2020   Acute renal failure syndrome ([place]) 11/22/2020   Low back pain 11/22/2020   Skin tear of right upper arm without complication 84/13/2440   Pressure injury of skin 10/23/2020   Urinary tract infection with hematuria 10/22/2020   Encephalopathy 10/22/2020   AKI (acute kidney injury) ([place])    Overweight (BMI 25.0-29.9) 08/19/2020   Vertebral fracture, osteoporotic ([place]) 08/18/2020   T8 vertebral fracture ([place]) 08/17/2020   Thrombocytopenia ([place]) 08/17/2020   CKD (chronic kidney disease) stage 3, GFR 30-59 ml/min (HCC) 08/17/2020   Elevated troponin 08/17/2020   Pain due to onychomycosis of toenails of both feet 07/27/2020   Callus of heel 07/27/2020   Bifascicular block 11/11/2019   Acute on chronic diastolic heart failure ([place]) 11/10/2019   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   Severe aortic stenosis 09/29/2019   Aortic valve disorder 10/28/2534   Systolic heart failure ([place]) 03/11/2019   Thrombophilia ([place]) 02/26/2019   Muscle pain 02/11/2019   Weakness 02/06/2019   Polymyalgia rheumatica ([place]) 02/06/2019   Shoulder joint pain 02/02/2019   Anemia 01/26/2019   Malignant melanoma of skin ([place]) 01/26/2019   Pain in right leg 01/26/2019   DDD (degenerative disc disease),  cervical 05/28/2018   Encounter for general adult medical examination without abnormal findings 11/19/2017   Carotid artery occlusion 07/03/2017   Hearing loss 07/03/2017   Fall 06/19/2017   Injury of upper extremity 06/19/2017   Neck pain 06/19/2017   Headache 06/19/2017   Visual disturbance 06/19/2017   Ganglion of hand 05/08/2017   Dilatation of aorta ([place]) 02/14/2017   Plantar fascial fibromatosis 02/14/2017   Cardiac murmur, unspecified 02/27/2016   Benign neoplasm of colon 08/24/2015   Dementia ([place]) 08/24/2015   Influenza due to unidentified influenza virus with other respiratory manifestations 03/15/2015   Epistaxis 12/01/2014   OSA (obstructive sleep apnea) 11/18/2014   Gastro-esophageal reflux disease with esophagitis 10/29/2014   Angina pectoris ([place]) 05/04/2014   Abnormal feces 07/31/2013   Other specified disorders of gingiva and edentulous alveolar ridge 07/31/2013   Spontaneous ecchymosis 10/10/2012   Thoracic ascending aortic aneurysm ([place]) 09/29/2012   Microscopic hematuria 04/11/2012   Proteinuria 04/11/2012   S/P laparoscopic cholecystectomy 04/09/2012   S/P CABG x 3 03/06/2012   S/P Maze operation for atrial fibrillation 03/06/2012  Paroxysmal atrial fibrillation ([place]) 02/29/2012   Bitten or stung by nonvenomous insect and other nonvenomous arthropods, initial encounter 12/03/2011   Depression screening 10/17/2011   CAD (coronary artery disease), native coronary artery    Hyperlipidemia    Hypertension    Chronic venous insufficiency    Gout    Benign prostatic hyperplasia    Post-traumatic stress disorder 03/13/2011   Pain in left leg 12/08/2010   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   Fatigue 08/08/2010   Retention of urine 04/18/2010   Constipation 07/05/2009   Kidney stone 07/05/2009   ED (erectile dysfunction) of organic origin 12/14/2008   Impaired fasting glucose 12/14/2008   Vitamin B deficiency 12/14/2008    ONSET DATE:  05/31/21  REFERRING DIAG: J62.836 (ICD-10-CM) - Left middle cerebral artery stroke (HCC)   THERAPY DIAG:  Muscle weakness (generalized)  Difficulty in walking, not elsewhere classified  Other abnormalities of gait and mobility  Unsteadiness on feet  Rationale for Evaluation and Treatment Rehabilitation  SUBJECTIVE:                                                                                                                                                                                              SUBJECTIVE STATEMENT: "Finding that as I move I get stabbing pains in my back." Denies N/T or radiation. Feels that exercising is what causes it. Denies questions on HEP.   Pt accompanied by: self  PERTINENT HISTORY: medical history significant of AAA; CAD s/p CABG; chronic systolic CHF; afib s/p Maze, on Xarelto; HTN; HLD; PMR (currently on steroids); pre-diabetes; OSA on CPAP; PAD with lymphedema; and AS s/p TAVR   PAIN:  Are you having pain? Yes: NPRS scale: 4/10 Pain location: lumbar spine Pain description: aching, sore, sharp Aggravating factors: standing/walking Relieving factors: recliner, hot pack, stretching  PRECAUTIONS: Fall, pressure sores bilateral calcaneus right > left  PATIENT GOALS be able to stand unsupported for a time to perform ADL, walking if possible  OBJECTIVE:        TODAY'S TREATMENT: 09/20/21 Activity Comments  Transfer w/c<>nustep  Required cueing for safe positioning of w/c before transfer; min A required  Nustep L3 x 6 min Ues/Les  Cues for pacing to avoid fatigue  STS in II bars x8 Cues to scoot to edge of seat, push off armrests, increase forward trunk lean; min A  Standing 1/2 turn in II bars x2 CGA-min A  Sitting with MHP on LB: Alt LAQ #1 2x10 Difficulty repositioning feet d/t foot drop  Sitting with MHP on LB: Alt march #1 2x10  Low amplitude and limited eccentric control  Sitting with  MHP on LB: HS curl yellow TB 10x Sitting on  airex; cues to slow down      HOME EXERCISE PROGRAM Last updated: 09/18/21 Access Code: [email or web address] URL: [email or web address] Date: 09/18/2021 Prepared by: [facility]  Exercises - Seated Heel Raise  - 1 x daily - 5 x weekly - 2 sets - 10 reps - Seated Toe Raise  - 1 x daily - 5 x weekly - 2 sets - 10 reps - Seated March  - 1 x daily - 5 x weekly - 2 sets - 10 reps - Seated Long Arc Quad  - 1 x daily - 5 x weekly - 2 sets - 10 reps - Seated Hip Adduction Squeeze with Ball  - 1 x daily - 5 x weekly - 2 sets - 10 reps - Row with Anchored Resistance on Swiss Ball  - 1 x daily - 5 x weekly - 2 sets - 10 reps - Seated Shoulder Extension and Scapular Retraction with Resistance on Swiss Ball  - 1 x daily - 5 x weekly - 2 sets - 10 reps    Below measures were taken at time of initial evaluation unless otherwise specified:   DIAGNOSTIC FINDINGS: IMPRESSION: Small areas of acute infarction in the right occipital lobe and left frontal lobe over the convexity. Mild to moderate acute infarct left operculum. Possible cerebral emboli.   Atrophy and mild chronic microvascular ischemia  COGNITION: Overall cognitive status: Within functional limits for tasks assessed Patient's historical recall is not very accurate, he reports his back injury presented x 9 months ago, whereas medical record notes encounter in 12/2019-01/2020 regarding L1 compression fx   SENSATION: Not tested  COORDINATION: Alt. Foot taps impaired due to dorsiflexion weakness  EDEMA:  Bilateral LE, wearing compression stockings. Uses lymphedema pump  MUSCLE TONE: DNT    POSTURE: rounded shoulders  LOWER EXTREMITY ROM:     Passive  Right Eval Left Eval  Hip flexion    Hip extension    Hip abduction    Hip adduction    Hip internal rotation    Hip external rotation    Knee flexion    Knee extension    Ankle dorsiflexion -10 -10  Ankle plantarflexion     Ankle inversion    Ankle eversion     (Blank rows = not tested)  LOWER EXTREMITY MMT:    MMT Right Eval Left Eval  Hip flexion 3 3  Hip extension    Hip abduction 2+ 2+  Hip adduction 3 3  Hip internal rotation    Hip external rotation    Knee flexion 3+ 3+  Knee extension 4- 4-  Ankle dorsiflexion 0 0  Ankle plantarflexion    Ankle inversion    Ankle eversion    (Blank rows = not tested)  BED MOBILITY:  Sit to supine Min A and Mod A Supine to sit Min A Pt notes his bed at home has assist rails  TRANSFERS: Assistive device utilized: Wheelchair (manual)  Sit to stand: CGA and Min A Stand to sit: CGA and Min A Chair to chair: CGA and Min A Floor:  DNT   W/C mobility Independent level surfaces  STAIRS:  DNT--pt has elevator to enter/exit home  GAIT: DNT due to safety concerns  FUNCTIONAL TESTs:  5 times sit to stand: unable, requires BUE and assist Static standing: dependent on BUE via RW (supported standing) Straight Leg Raise: 20+ degrees extensor lag  PATIENT SURVEYS:  FOTO to be assessed     PATIENT EDUCATION: Education details: assessment findings Person educated: Patient Education method: Explanation Education comprehension: verbalized understanding   HOME EXERCISE PROGRAM: To be initiated, demonstration/rehearsal of hip add isometric    GOALS: Goals reviewed with patient? Yes  SHORT TERM GOALS: Target date: 09/26/2021  Patient will be independent in HEP to improve functional outcomes Baseline: Goal status: IN PROGRESS  2.  Patient to demonstrate sit to supine w/ supervision using adaptive equipment to reduce level of assist from caregiver Baseline: Min-mod A for BLE to clear EOB Goal status: IN PROGRESS    LONG TERM GOALS: Target date: 10/24/2021  Demonstrate improved hip abduction strength to 3+/5 to improve standing stability Baseline: 2+/5 Goal status: IN PROGRESS  2.  Manifest improved BLE strength as evidenced by  ability to perform 5xSTS test in 30 sec using BUE Baseline: unable Goal status: IN PROGRESS  3.  Demonstrate static standing balance of Fair (no UE support) x 15 sec to improve safety with ADL performance Baseline: unable, dependent on BUE (Fair-) Goal status: IN PROGRESS  4.  Demonstrate transfers w/ set-up assist to increase independence and safety with mobility Baseline: CGA-min A Goal status: IN PROGRESS   ASSESSMENT:  CLINICAL IMPRESSION: Patient arrived to session with report of some mild-moderate LBP. Denies N/T or radiation. Patient arrived wearing Velcro boot/slippers rather than socks which he noted to be "cumbersome." Patient tolerated Nustep warm up with good tolerance. Also able to perform STS transfers with min A and cueing for proper set up and hand position. Less reliance required on parallel bars and PT today with transfers. Proceeded with sitting LE strengthening ther-ex with small weighted resistance and MHP on LB to address pain. Patient tolerated these exercises well despite fatigue. Pain seems to be biggest limiting factor during sessions, thus may continue to use moist heat or other modalities during sessions.    OBJECTIVE IMPAIRMENTS cardiopulmonary status limiting activity, decreased activity tolerance, decreased balance, decreased knowledge of use of DME, decreased mobility, difficulty walking, decreased ROM, decreased strength, impaired perceived functional ability, and pain.   ACTIVITY LIMITATIONS lifting, bending, standing, squatting, transfers, bed mobility, toileting, dressing, and locomotion level  PARTICIPATION LIMITATIONS: meal prep, cleaning, community activity, yard work, and home mgmt  PERSONAL FACTORS Age, Time since onset of injury/illness/exacerbation, and 3+ comorbidities: see PMH  are also affecting patient's functional outcome.   REHAB POTENTIAL: Good  CLINICAL DECISION MAKING: Evolving/moderate complexity  EVALUATION COMPLEXITY:  Moderate  PLAN: PT FREQUENCY: 2x/week  PT DURATION: 8 weeks  PLANNED INTERVENTIONS: Therapeutic exercises, Therapeutic activity, Neuromuscular re-education, Balance training, Gait training, Patient/Family education, Self Care, Joint mobilization, Stair training, Vestibular training, Canalith repositioning, Orthotic/Fit training, DME instructions, Aquatic Therapy, Dry Needling, Electrical stimulation, Wheelchair mobility training, Spinal mobilization, Cryotherapy, Moist heat, Taping, Ionotophoresis '4mg'$ /ml Dexamethasone, and Manual therapy  PLAN FOR NEXT SESSION: modalities to address LBP; nustep, LE strengthening, transfers,bed mobility with leg lifter   Janene Harvey, PT, DPT 09/20/21 4:30 PM  [facility] [address] [place], [place] [address] Phone # [phone] Fax # [phone]

## 2021-09-20 ENCOUNTER — Ambulatory Visit: Payer: Medicare Other | Admitting: Physical Therapy

## 2021-09-20 ENCOUNTER — Encounter: Payer: Self-pay | Admitting: Physical Therapy

## 2021-09-20 ENCOUNTER — Encounter (HOSPITAL_BASED_OUTPATIENT_CLINIC_OR_DEPARTMENT_OTHER): Payer: Medicare Other | Admitting: Physician Assistant

## 2021-09-20 ENCOUNTER — Ambulatory Visit: Payer: Medicare Other

## 2021-09-20 ENCOUNTER — Ambulatory Visit: Payer: Medicare Other | Admitting: Occupational Therapy

## 2021-09-20 DIAGNOSIS — N401 Enlarged prostate with lower urinary tract symptoms: Secondary | ICD-10-CM | POA: Diagnosis not present

## 2021-09-20 DIAGNOSIS — I69351 Hemiplegia and hemiparesis following cerebral infarction affecting right dominant side: Secondary | ICD-10-CM

## 2021-09-20 DIAGNOSIS — L97312 Non-pressure chronic ulcer of right ankle with fat layer exposed: Secondary | ICD-10-CM | POA: Diagnosis not present

## 2021-09-20 DIAGNOSIS — R2681 Unsteadiness on feet: Secondary | ICD-10-CM | POA: Diagnosis not present

## 2021-09-20 DIAGNOSIS — I7025 Atherosclerosis of native arteries of other extremities with ulceration: Secondary | ICD-10-CM | POA: Diagnosis not present

## 2021-09-20 DIAGNOSIS — I872 Venous insufficiency (chronic) (peripheral): Secondary | ICD-10-CM | POA: Diagnosis not present

## 2021-09-20 DIAGNOSIS — R41840 Attention and concentration deficit: Secondary | ICD-10-CM | POA: Diagnosis not present

## 2021-09-20 DIAGNOSIS — M6281 Muscle weakness (generalized): Secondary | ICD-10-CM

## 2021-09-20 DIAGNOSIS — S71111D Laceration without foreign body, right thigh, subsequent encounter: Secondary | ICD-10-CM | POA: Diagnosis not present

## 2021-09-20 DIAGNOSIS — R4701 Aphasia: Secondary | ICD-10-CM | POA: Diagnosis not present

## 2021-09-20 DIAGNOSIS — R208 Other disturbances of skin sensation: Secondary | ICD-10-CM | POA: Diagnosis not present

## 2021-09-20 DIAGNOSIS — W19XXXA Unspecified fall, initial encounter: Secondary | ICD-10-CM | POA: Diagnosis not present

## 2021-09-20 DIAGNOSIS — R2689 Other abnormalities of gait and mobility: Secondary | ICD-10-CM

## 2021-09-20 DIAGNOSIS — L97822 Non-pressure chronic ulcer of other part of left lower leg with fat layer exposed: Secondary | ICD-10-CM | POA: Diagnosis not present

## 2021-09-20 DIAGNOSIS — L89153 Pressure ulcer of sacral region, stage 3: Secondary | ICD-10-CM | POA: Diagnosis not present

## 2021-09-20 DIAGNOSIS — R262 Difficulty in walking, not elsewhere classified: Secondary | ICD-10-CM | POA: Diagnosis not present

## 2021-09-20 DIAGNOSIS — T8131XA Disruption of external operation (surgical) wound, not elsewhere classified, initial encounter: Secondary | ICD-10-CM | POA: Diagnosis not present

## 2021-09-20 DIAGNOSIS — R41841 Cognitive communication deficit: Secondary | ICD-10-CM | POA: Diagnosis not present

## 2021-09-20 DIAGNOSIS — L89613 Pressure ulcer of right heel, stage 3: Secondary | ICD-10-CM | POA: Diagnosis not present

## 2021-09-20 NOTE — Progress Notes (Addendum)
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 wounds to aid in healing. Additional Orders /  Instructions: Other: - May continue to walk and work with physical therapy. apply xeroform to the skin tear areas to arms for protection. WOUND #14: - Lower Leg Wound Laterality: Left, Anterior Cleanser: Soap and Water 3 x Per Week/30 Days Discharge Instructions: May shower and wash wound with dial antibacterial soap and water prior to dressing change. Peri-Wound Care: Skin Prep (Generic) 3 x Per Week/30 Days Discharge Instructions: Use skin prep as directed Prim Dressing: Promogran Prisma Matrix, 4.34 (sq in) (silver collagen) (Dispense As Written) 3 x Per Week/30 Days ary Discharge Instructions: Moisten collagen with hydrogel or KY jelly. Secondary Dressing: Woven Gauze Sponge, Non-Sterile 4x4 in 3 x Per Week/30 Days Discharge Instructions: Apply over primary dressing as directed. Secured With: Coban Self-Adherent Wrap 4x5 (in/yd) 3 x Per Week/30 Days Discharge Instructions: Secure with Coban ONLY ONE LAYER AROUND LEG.**** Secured With: Kerlix Roll Sterile, 4.5x3.1 (in/yd) 3 x Per Week/30 Days Discharge Instructions: Secure with Kerlix as directed. Secured With: 79M Medipore H Soft Cloth Surgical T ape, 4 x 10 (in/yd) 3 x Per Week/30 Days Discharge Instructions: Secure with tape as directed. WOUND #15: - Calcaneus Wound Laterality: Right Cleanser: Soap and Water 3 x Per Week/30 Days Discharge Instructions: May shower and wash wound with dial antibacterial soap and water prior to dressing change. Cleanser: Wound Cleanser 3 x Per Week/30 Days Discharge Instructions: Cleanse the wound with wound cleanser prior to applying a clean dressing using gauze sponges, not tissue or cotton balls. Prim Dressing: Xeroform Occlusive Gauze Dressing, 4x4 in 3 x Per Week/30 Days ary Discharge Instructions: Apply to wound bed as instructed Secondary Dressing: ALLEVYN Heel 4 1/2in x 5 1/2in / 10.5cm x 13.5cm (Generic) 3 x Per Week/30 Days Discharge Instructions: Apply over primary dressing as  directed. Secondary Dressing: T Non-adherent Dressing, 2x3 in (Generic) 3 x Per Week/30 Days elfa Discharge Instructions: Apply over primary dressing as directed. Secondary Dressing: Woven Gauze Sponge, Non-Sterile 4x4 in (Generic) 3 x Per Week/30 Days Discharge Instructions: ***PLEASE SEND BULK.***Apply over primary dressing as directed. Secured With: [employer], 4.5x3.1 (in/yd) (Generic) 3 x Per Week/30 Days Discharge Instructions: Secure with Kerlix as directed. WOUND #19: - Lower Leg Wound Laterality: Left, Medial Cleanser: Soap and Water 3 x Per Week/30 Days Discharge Instructions: May shower and wash wound with dial antibacterial soap and water prior to dressing change. Cleanser: Wound  Cleanser 3 x Per Week/30 Days Discharge Instructions: Cleanse the wound with wound cleanser prior to applying a clean dressing using gauze sponges, not tissue or cotton balls. Prim Dressing: Xeroform Occlusive Gauze Dressing, 4x4 in 3 x Per Week/30 Days ary Discharge Instructions: Apply to wound bed as instructed Secondary Dressing: Woven Gauze Sponge, Non-Sterile 4x4 in 3 x Per Week/30 Days Discharge Instructions: Apply over primary dressing as directed. Secured With: Coban Self-Adherent Wrap 4x5 (in/yd) 3 x Per Week/30 Days Discharge Instructions: Secure with Coban ONLY ONE LAYER AROUND LEG.**** Secured With: Kerlix Roll Sterile, 4.5x3.1 (in/yd) 3 x Per Week/30 Days Discharge Instructions: Secure with Kerlix as directed. Secured With: 67M Medipore H Soft Cloth Surgical T ape, 4 x 10 (in/yd) 3 x Per Week/30 Days Discharge Instructions: Secure with tape as directed. WOUND #20: - T Second Wound Laterality: Left oe Cleanser: Soap and Water 3 x Per Week/ Discharge Instructions: May shower and wash wound with dial antibacterial soap and water prior to dressing change. Cleanser: Wound Cleanser 3 x Per Week/ Discharge Instructions: Cleanse the wound with wound cleanser prior to applying a clean  dressing using gauze sponges, not tissue or cotton balls. Prim Dressing: Xeroform Occlusive Gauze Dressing, 4x4 in 3 x Per Week/ ary Discharge Instructions: Apply to wound bed as instructed Secondary Dressing: bandaid 3 x Per Week/ WOUND #21: - T Second Wound Laterality: Right oe Cleanser: Soap and Water 3 x Per Week/ Discharge Instructions: May shower and wash wound with dial antibacterial soap and water prior to dressing change. Cleanser: Wound Cleanser 3 x Per Week/ Discharge Instructions: Cleanse the wound with wound cleanser prior to applying a clean dressing using gauze sponges, not tissue or cotton balls. Prim Dressing: Xeroform Occlusive Gauze Dressing, 4x4 in 3 x Per Week/ ary Discharge Instructions: Apply to wound bed as instructed Secondary Dressing: bandaid 3 x Per Week/ WOUND #3: - Ankle Wound Laterality: Left, Lateral Cleanser: Soap and Water 3 x Per Week/30 Days Discharge Instructions: May shower and wash wound with dial antibacterial soap and water prior to dressing change. Peri-Wound Care: Skin Prep (Generic) 3 x Per Week/30 Days Discharge Instructions: Use skin prep as directed Prim Dressing: Promogran Prisma Matrix, 4.34 (sq in) (silver collagen) (Dispense As Written) 3 x Per Week/30 Days ary Discharge Instructions: Moisten collagen with hydrogel or KY jelly. Secondary Dressing: Woven Gauze Sponge, Non-Sterile 4x4 in 3 x Per Week/30 Days Discharge Instructions: Apply over primary dressing as directed. Secured With: Coban Self-Adherent Wrap 4x5 (in/yd) 3 x Per Week/30 Days Discharge Instructions: Secure with Coban ONLY ONE LAYER AROUND LEG.**** Secured With: Kerlix Roll Sterile, 4.5x3.1 (in/yd) 3 x Per Week/30 Days Discharge Instructions: Secure with Kerlix as directed. Secured With: 67M Medipore H Soft Cloth Surgical T ape, 4 x 10 (in/yd) 3 x Per Week/30 Days Discharge Instructions: Secure with tape as directed. 1. I am going to suggest that we have the patient go  ahead and continue with the recommendation for wound care measures as before and he is in agreement with that plan. This includes the use of the collagen and Xeroform gauze dressings depending on which wounds we are discussing. 2. Also can recommend that he continue with the roll gauze to hold everything in place which seems to be doing quite well. 3. I am also can recommend that he use the juxta lites in order to help with compression he does have 1 from both legs at this point which I think is definitely a good thing for him.  We will see patient back for reevaluation in 1 week here in the clinic. If anything worsens or changes patient will contact our office for additional recommendations. Electronic Signature(s) Signed: 09/22/2021 1:41:58 PM By: [name] PA-C Entered By: [name] on 09/22/2021 13:41:58 -------------------------------------------------------------------------------- SuperBill Details Patient Name: Date of Service: [name] [name], [name] [name] ID I. 09/20/2021 Medical Record Number: [id number] Patient Account Number: [id number] Date of Birth/Sex: Treating RN: 1926-10-27 (88 y.o. [name] [name], [name] Primary Care Provider: [name] Other Clinician: Referring Provider: Treating Provider/Extender: Eli Hose in Treatment: 39 Diagnosis Coding ICD-10 Codes Code Description [phone] Pressure ulcer of right heel, stage 3 I73.89 Other specified peripheral vascular diseases I87.2 Venous insufficiency (chronic) (peripheral) L97.312 Non-pressure chronic ulcer of right ankle with fat layer exposed L97.822 Non-pressure chronic ulcer of other part of left lower leg with fat layer exposed L89.153 Pressure ulcer of sacral region, stage 3 S71.111D Laceration without foreign body, right thigh, subsequent encounter T81.31XA Disruption of external operation (surgical) wound, not elsewhere classified, initial encounter N40.1 Benign prostatic hyperplasia with lower  urinary tract symptoms N18.30 Chronic kidney disease, stage 3 unspecified I10 Essential (primary) hypertension Facility Procedures CPT4 Code: [id number] Description: 00349 - DEB SUBQ TISSUE 20 SQ CM/< ICD-10 Diagnosis Description L89.613 Pressure ulcer of right heel, stage 3 Modifier: Quantity: 1 Physician Procedures : CPT4 Code Description Modifier [id number] - WC PHYS LEVEL 3 - EST PT 25 ICD-10 Diagnosis Description L89.613 Pressure ulcer of right heel, stage 3 I73.89 Other specified peripheral vascular diseases I87.2 Venous insufficiency (chronic) (peripheral)  L97.312 Non-pressure chronic ulcer of right ankle with fat layer exposed Quantity: 1 : [id number] 11042 - WC PHYS SUBQ TISS 20 SQ CM ICD-10 Diagnosis Description L89.613 Pressure ulcer of right heel, stage 3 Quantity: 1 Electronic Signature(s) Signed: 09/22/2021 1:42:44 PM By: [name] PA-C Previous Signature: 09/20/2021 5:32:30 PM Version By: Deon Pilling RN, BSN Previous Signature: 09/20/2021 5:51:27 PM Version By: [name] PA-C Entered By: [name] on 09/22/2021 13:42:43

## 2021-09-20 NOTE — Therapy (Signed)
OUTPATIENT OCCUPATIONAL THERAPY  TREATMENT NOTE  Patient Name: [name] [name] MRN: [id number] DOB:Jan 20, 1926, 88 y.o., male Today's Date: 09/20/2021  PCP: [name], MD REFERRING PROVIDER: Charlett Blake, MD    OT End of Session - 09/20/21 1354     Visit Number 5    Number of Visits 17    Date for OT Re-Evaluation 10/27/21    Authorization Type BCBS Medicare    OT Start Time 1320    OT Stop Time 1400    OT Time Calculation (min) 40 min    Activity Tolerance Patient tolerated treatment well    Behavior During Therapy WFL for tasks assessed/performed              Past Medical History:  Diagnosis Date   AAA (abdominal aortic aneurysm) ([place])    Arthritis    Ascending aorta dilatation ([place]) 03/13/2012   Fusiform dilatation of the ascending thoracic aorta discovered during surgery, max diameter 4.2-4.3 cm   BPH (benign prostatic hypertrophy)    Chronic systolic heart failure ([place]) 03/03/2012   Chronic venous insufficiency    GERD (gastroesophageal reflux disease)    Glaucoma    Gout    History of kidney stones    Hyperlipidemia    Hypertension    Obesity (BMI 30-39.9)    Paroxysmal atrial fibrillation ([place]) 02/29/2012   Recurrent paroxysmal, new-onset    PMR (polymyalgia rheumatica) (HCC)    Polymyalgia rheumatica ([place]) 02/06/2019   Pre-diabetes    S/P CABG x 3 03/06/2012   LIMA to LAD, SVG to OM, SVG to RCA, EVH via right thigh   S/P Maze operation for atrial fibrillation 03/06/2012   Complete bilateral lesions set using bipolar radiofrequency and cryothermy ablation with clipping of LA appendage   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   s/p TAVR with a 26 mm Edwards via the left subclavian by Drs Buena Irish and Bartle   Severe aortic stenosis 09/29/2019   Sleep apnea    USES CPAP NIGHTLY   Past Surgical History:  Procedure Laterality Date   ABDOMINAL AORTOGRAM W/LOWER EXTREMITY N/A 10/26/2020   Procedure: ABDOMINAL AORTOGRAM  W/LOWER EXTREMITY;  Surgeon: Broadus John, MD;  Location: [facility];  Service: Cardiovascular;  Laterality: N/A;   CARDIAC CATHETERIZATION     CATARACT EXTRACTION, BILATERAL     with lens implants   CHOLECYSTECTOMY N/A 03/24/2012   Procedure: LAPAROSCOPIC CHOLECYSTECTOMY WITH INTRAOPERATIVE CHOLANGIOGRAM;  Surgeon: Zenovia Jarred, MD;  Location: [facility];  Service: General;  Laterality: N/A;   CORONARY ARTERY BYPASS GRAFT N/A 03/06/2012   Procedure: CORONARY ARTERY BYPASS GRAFTING (CABG);  Surgeon: Rexene Alberts, MD;  Location: [facility];  Service: Open Heart Surgery;  Laterality: N/A;   ENDOVEIN HARVEST OF GREATER SAPHENOUS VEIN Right 03/06/2012   Procedure: ENDOVEIN HARVEST OF GREATER SAPHENOUS VEIN;  Surgeon: Rexene Alberts, MD;  Location: [facility];  Service: Open Heart Surgery;  Laterality: Right;   INTRAOPERATIVE TRANSESOPHAGEAL ECHOCARDIOGRAM N/A 03/06/2012   Procedure: INTRAOPERATIVE TRANSESOPHAGEAL ECHOCARDIOGRAM;  Surgeon: Rexene Alberts, MD;  Location: [facility];  Service: Open Heart Surgery;  Laterality: N/A;   IR KYPHO LUMBAR INC FX REDUCE BONE BX UNI/BIL CANNULATION INC/IMAGING  02/23/2020   LEFT HEART CATH Right 03/02/2012   Procedure: LEFT HEART CATH;  Surgeon: Sherren Mocha, MD;  Location: [facility];  Service: Cardiovascular;  Laterality: Right;   MAZE N/A 03/06/2012   Procedure: MAZE;  Surgeon: Rexene Alberts, MD;  Location: [facility];  Service: Open Heart Surgery;  Laterality: N/A;   PERIPHERAL VASCULAR INTERVENTION Right 10/26/2020   Procedure: PERIPHERAL VASCULAR INTERVENTION;  Surgeon: Broadus John, MD;  Location: [facility];  Service: Cardiovascular;  Laterality: Right;   PROSTATECTOMY     partial   RIGHT/LEFT HEART CATH AND CORONARY/GRAFT ANGIOGRAPHY N/A 09/29/2019   Procedure: RIGHT/LEFT HEART CATH AND CORONARY/GRAFT ANGIOGRAPHY;  Surgeon: Adrian Prows, MD;  Location: [facility];  Service: Cardiovascular;  Laterality: N/A;   TEE WITHOUT CARDIOVERSION N/A  11/10/2019   Procedure: TRANSESOPHAGEAL ECHOCARDIOGRAM (TEE);  Surgeon: Burnell Blanks, MD;  Location: [place] [place];  Service: Open Heart Surgery;  Laterality: N/A;   TRANSCATHETER AORTIC VALVE REPLACEMENT, TRANSFEMORAL  11/10/2019   Patient Active Problem List   Diagnosis Date Noted   Left middle cerebral artery stroke ([place]) 05/31/2021   Acute CVA (cerebrovascular accident) ([place]) 05/25/2021   DNR (do not resuscitate) 05/25/2021   Osteoporosis 05/05/2021   Gouty arthritis 02/13/2021   Morbid obesity ([place]) 02/13/2021   Abdominal aortic aneurysm without rupture ([place]) 11/22/2020   Acute renal failure syndrome ([place]) 11/22/2020   Low back pain 11/22/2020   Skin tear of right upper arm without complication 01/74/9449   Pressure injury of skin 10/23/2020   Urinary tract infection with hematuria 10/22/2020   Encephalopathy 10/22/2020   AKI (acute kidney injury) ([place])    Overweight (BMI 25.0-29.9) 08/19/2020   Vertebral fracture, osteoporotic ([place]) 08/18/2020   T8 vertebral fracture ([place]) 08/17/2020   Thrombocytopenia ([place]) 08/17/2020   CKD (chronic kidney disease) stage 3, GFR 30-59 ml/min (HCC) 08/17/2020   Elevated troponin 08/17/2020   Pain due to onychomycosis of toenails of both feet 07/27/2020   Callus of heel 07/27/2020   Bifascicular block 11/11/2019   Acute on chronic diastolic heart failure ([place]) 11/10/2019   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   Severe aortic stenosis 09/29/2019   Aortic valve disorder 67/59/1638   Systolic heart failure ([place]) 03/11/2019   Thrombophilia ([place]) 02/26/2019   Muscle pain 02/11/2019   Weakness 02/06/2019   Polymyalgia rheumatica ([place]) 02/06/2019   Shoulder joint pain 02/02/2019   Anemia 01/26/2019   Malignant melanoma of skin ([place]) 01/26/2019   Pain in right leg 01/26/2019   DDD (degenerative disc disease), cervical 05/28/2018   Encounter for general adult medical examination without abnormal findings 11/19/2017    Carotid artery occlusion 07/03/2017   Hearing loss 07/03/2017   Fall 06/19/2017   Injury of upper extremity 06/19/2017   Neck pain 06/19/2017   Headache 06/19/2017   Visual disturbance 06/19/2017   Ganglion of hand 05/08/2017   Dilatation of aorta ([place]) 02/14/2017   Plantar fascial fibromatosis 02/14/2017   Cardiac murmur, unspecified 02/27/2016   Benign neoplasm of colon 08/24/2015   Dementia ([place]) 08/24/2015   Influenza due to unidentified influenza virus with other respiratory manifestations 03/15/2015   Epistaxis 12/01/2014   OSA (obstructive sleep apnea) 11/18/2014   Gastro-esophageal reflux disease with esophagitis 10/29/2014   Angina pectoris ([place]) 05/04/2014   Abnormal feces 07/31/2013   Other specified disorders of gingiva and edentulous alveolar ridge 07/31/2013   Spontaneous ecchymosis 10/10/2012   Thoracic ascending aortic aneurysm ([place]) 09/29/2012   Microscopic hematuria 04/11/2012   Proteinuria 04/11/2012   S/P laparoscopic cholecystectomy 04/09/2012   S/P CABG x 3 03/06/2012   S/P Maze operation for atrial fibrillation 03/06/2012   Paroxysmal atrial fibrillation ([place]) 02/29/2012   Bitten or stung by nonvenomous insect and other nonvenomous arthropods, initial encounter 12/03/2011  Depression screening 10/17/2011   CAD (coronary artery disease), native coronary artery    Hyperlipidemia    Hypertension    Chronic venous insufficiency    Gout    Benign prostatic hyperplasia    Post-traumatic stress disorder 03/13/2011   Pain in left leg 12/08/2010   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   Fatigue 08/08/2010   Retention of urine 04/18/2010   Constipation 07/05/2009   Kidney stone 07/05/2009   ED (erectile dysfunction) of organic origin 12/14/2008   Impaired fasting glucose 12/14/2008   Vitamin B deficiency 12/14/2008    ONSET DATE: 05/25/2021  REFERRING DIAG: D74.128 (ICD-10-CM) - Left middle cerebral artery stroke   THERAPY DIAG:  Hemiplegia and  hemiparesis following cerebral infarction affecting right dominant side (HCC)  Attention and concentration deficit  Other disturbances of skin sensation  Muscle weakness (generalized)  Rationale for Evaluation and Treatment Rehabilitation  SUBJECTIVE:   SUBJECTIVE STATEMENT: Pt reports that there are certain movements and activities during PT that increase his back pain.   Pt accompanied by: self and significant other (dropped off pt)  PERTINENT HISTORY:  H/o AAA, CAD with CABG, compression fractures, chronic anemia followed by hematology services, chronic systolic congestive heart failure, A-fib/AF status post MAZE on Xarelto, hypertension, hyperlipidemia, PMR maintained on steroids, prediabetes, early dementia maintained on Namenda, chronic lower extremity wounds with [place] boot OSA with CPAP, PAD with lymphedema status post balloon angioplasty of posterior tibial artery October 2022 recent UTI placed on antibiotic therapy.  He does have a personal care attendant Monday Wednesday Friday for 3 hours.  Modified independent transfers power wheelchair bound x3 to 4 months prior to that ambulating short distances with a rollator.  Present 05/25/2021 with right-sided weakness and aphasia of acute onset.  Cranial CT scan showed small left MCA territory cortical infarct within the mid to posterior left frontal lobe.  CT angiogram head and neck no large vessel occlusion.   PRECAUTIONS: Fall  PAIN:  Are you having pain? Yes: NPRS scale: 4/10 Pain location: mid back Pain description: constant; dull, achy Aggravating factors: standing, certain movements/exercises Relieving factors: injections, flat on back  FALLS: Has patient fallen in last 6 months? Yes. Number of falls 4, has not fallen recently, per pt report  LIVING ENVIRONMENT: Lives with: lives with their spouse Lives in: House/apartment Stairs:  Yes: steps on front and back of house, however has an elevator at the back, steps to 2nd floor  but does not need to go upstairs Has following equipment at home: Environmental consultant - 2 wheeled, Environmental consultant - 4 wheeled, Wheelchair (power), Wheelchair (manual), shower chair, bed side commode, Grab bars, and elevator to access home from outside, hand held shower head  PLOF:  Has a caregiver 3x/week that assists with ADLs, d/t spinal compression fx pt has become more w/c bound in 5-6 months due to    [place] to talk properly; to write and to spell  OBJECTIVE:   HAND DOMINANCE: Right  ADLs: Dressing: Setup w/ UB dressing, wife assists w/ pants, socks, shoes Grooming: Mod I from seated position Eating: reports great difficulty using a knife to cut food, wife frequently cuts foods for him Toileting: Occ difficulty pulling pants back up after toileting, leans forward to complete hygiene Bathing: Supervision/setup  Tub Shower transfers: caregiver assist w/ transfers in/out of shower providing SPV and occ Min A Equipment: Shower seat with back, Grab bars, Walk in shower, bed side commode, Reacher, and Sock aid  IADLs: Financial management: Reports difficulty with writing a  check, math, spelling, but did most of financial management Handwriting: 50% legible and difficulty with spelling that has gotten worse since CVA  MOBILITY STATUS: Needs Assist: Mod I stand pivot from w/c, Supervision for shower transfers  FUNCTIONAL OUTCOME MEASURES: FOTO: TBD at next session  UPPER EXTREMITY MMT:     MMT Right eval Left eval  Shoulder flexion 4+/5 4+/5  Shoulder abduction    Shoulder adduction    Shoulder extension    Shoulder internal rotation    Shoulder external rotation    Middle trapezius    Lower trapezius    Elbow flexion 4+/5 4+/5  Elbow extension 4+/5 4+/5  Wrist flexion    Wrist extension    Wrist ulnar deviation    Wrist radial deviation    Wrist pronation    Wrist supination    (Blank rows = not tested)  HAND FUNCTION: Grip strength: Right: 30 lbs; Left: 30  lbs  COORDINATION: 9 Hole Peg test: Right: 1:06.53 sec; Left: 34.53 sec 09/20/21: Right: 56.15 sec  SENSATION: Reports numbness and difficulty maintaining grasp on spoon/fork and writing utensil  -------------------------------------------------------------------------------------------------------------------------------------------------------- (objective measures above completed at initial evaluation unless otherwise dated)  TODAY'S TREATMENT:  Handwriting: engaged in pre-writing activities with dot to dot and maze puzzles with focus on coordination and manipulation of pen during pre-writing strokes to facilitate carryover to writing tasks.  Discussed and showed pt pictures to purchase of built up handles/"pencil grips" to allow for improved grip on writing utensils as hand "slips" when holding skinny pen/pencil. Pt demonstrating mod improvements with larger circumference pens. Alternating attention: Engaged in small peg board pattern replication with use of key to identify correlating colors, challenging alternating attention, visual perception, and R hand coordination and dexterity. Pt initially demonstrating decreased organization with visual scanning, OT providing cues and demonstration with pt then demonstrating improved organization and sequencing. Pt demonstrating mild-mod difficulty with manipulation of pegs in-hand to place in peg board, largely due to small size of pegs. Educated on functional carryover of task implications with systematic process to minimize onset of errors. Completed 9 hole peg test at end of session with improvements noted (see above for measurements).   PATIENT EDUCATION: Ongoing condition-specific education related to therapeutic interventions completed this session Person educated: Patient Education method: Explanation, Demonstration, and Handouts Education comprehension: verbalized understanding   HOME EXERCISE PROGRAM: Access Code: [email or web address] URL:  [email or web address] Date: 09/11/2021 Prepared by: [facility]  Exercises - Putty Squeezes  - 1 x daily - 1 sets - 10 reps - Rolling Putty on Table  - 1 x daily - 1 sets - 10 reps - Thumb Opposition with Putty  - 1 x daily - 1 sets - 10 reps - Key Pinch with Putty  - 1 x daily - 1 sets - 10 reps - Tip Pinch with Putty  - 1 x daily - 1 sets - 10 reps - Removing Marbles from Putty  - 1 x daily - 1 sets - 10 reps  GOALS: Goals reviewed with patient? Yes  SHORT TERM GOALS: Target date: 09/29/2021  Pt will verbalize understanding of adapted strategies and/or potential AE needs to increase ease, safety, and independence w/ LB dressing. Baseline: Goal status: IN PROGRESS  2.  Pt will be independent with FMC/GMC and strengthening HEP to increase independence with handwriting and cutting of foods. Baseline:  Goal status: IN PROGRESS  3. Pt will demonstrate improved fine motor coordination for ADLs as evidenced by  decreasing 9 hole peg test score for RUE by 5 secs  Baseline: 9 Hole Peg test: Right: 1:06.53 sec; Left: 34.53 sec  Goal status: MET - 56.15 sec   LONG TERM GOALS: Target date: 10/27/2021  Pt will report ability to complete LB dressing with supervision for standing balance and use of AE as needed. Baseline:  Goal status: IN PROGRESS  2.  Pt will demonstrated improved standing balance to stand for 1 min with alternating UE to allow for increased independence with LB dressing/clothing management post toileting. Baseline:  Goal status: IN PROGRESS  3.  Pt will demonstrated improved working memory and recall during check writing/balancing check book by making 0 errors. Baseline:  Goal status: IN PROGRESS  4.  Pt will demonstrate improved fine motor coordination for ADLs as evidenced by decreasing 9 hole peg test score for RUE by 20 secs Baseline: 9 Hole Peg test: Right: 1:06.53 sec; Left: 34.53 sec Goal status: REVISED -  upgraded 09/20/21 due to completing in 56.15 sec   5.  Pt will write a short paragraph with 90% legibility and no significant errors in spelling as evidenced by </= to 3 spelling errors. Baseline:  Goal status: IN PROGRESS   ASSESSMENT:  CLINICAL IMPRESSION: Treatment session with focus on [facility], pre-writing activities, as well as alternating attention during table top tasks to improve sequencing and organization as well as coordination to complete writing with improved spelling.  Pt continues to demonstrate decreased grip strength and coordination to maintain grip on pen during writing activities, therefore discussed AE such as "pencil grip" to improve sustained grasp on writing utensil. Pt is demonstrating improvements in coordination as evidenced by improvements in 9 hole peg test.  PERFORMANCE DEFICITS in functional skills including ADLs, IADLs, coordination, sensation, ROM, strength, pain, FMC, GMC, balance, body mechanics, endurance, decreased knowledge of use of DME, and UE functional use, cognitive skills including attention, problem solving, sequencing, and thought, and psychosocial skills including coping strategies, environmental adaptation, and routines and behaviors.   IMPAIRMENTS are limiting patient from ADLs and IADLs.   COMORBIDITIES may have co-morbidities  that affects occupational performance. Patient will benefit from skilled OT to address above impairments and improve overall function.  PLAN: OT FREQUENCY: 2x/week  OT DURATION: 8 weeks  PLANNED INTERVENTIONS: self care/ADL training, therapeutic exercise, therapeutic activity, neuromuscular re-education, balance training, functional mobility training, moist heat, cryotherapy, patient/family education, cognitive remediation/compensation, psychosocial skills training, energy conservation, coping strategies training, and DME and/or AE instructions  RECOMMENDED OTHER SERVICES: N/A  CONSULTED AND AGREED WITH PLAN OF CARE:  Patient  PLAN FOR NEXT SESSION: review coordination HEP if needed, [place] tasks to address handwriting and other alternating attention tasks, word games/association for aphasia and carryover to spelling    [name], [place], OTR/L 09/20/2021, 1:54 PM

## 2021-09-20 NOTE — Progress Notes (Signed)
UGONNA, [name] [name] ([id number]) Visit Report for 09/20/2021 Arrival Information Details Patient Name: Date of Service: [name] [name] ID I. 09/20/2021 8:15 A M Medical Record Number: [id number] Patient Account Number: [id number] Date of Birth/Sex: Treating RN: [name] (88 y.o. [name] [name] Primary Care Gervase Colberg: [name] Other Clinician: Referring Izea Livolsi: Treating [name] [name]/Extender: [name] [name] in Treatment: 42 Visit Information History Since Last Visit Added or deleted any medications: No Patient Arrived: Wheel Chair Any new allergies or adverse reactions: No Arrival Time: 08:01 Had a fall or experienced change in No Accompanied By: family members activities of daily living that may affect Transfer Assistance: Manual risk of falls: Patient Identification Verified: Yes Signs or symptoms of abuse/neglect since last visito No Secondary Verification Process Completed: Yes Hospitalized since last visit: No Patient Requires Transmission-Based Precautions: No Implantable device outside of the clinic excluding No Patient Has Alerts: Yes cellular tissue based products placed in the center Patient Alerts: Patient on Blood Thinner since last visit: Has Dressing in Place as Prescribed: Yes Has Compression in Place as Prescribed: Yes Pain Present Now: No Notes itching to left leg. Electronic Signature(s) Signed: 09/20/2021 5:32:30 PM By: Deon Pilling RN, BSN Entered By: Deon Pilling on 09/20/2021 08:13:22 -------------------------------------------------------------------------------- Encounter Discharge Information Details Patient Name: Date of Service: [name] [name], [name] 09/20/2021 8:15 A M Medical Record Number: [id number] Patient Account Number: [id number] Date of Birth/Sex: Treating RN: [birth year] (88 y.o. [name] [name] Primary Care [name] [name]: [name] Other Clinician: Referring Keyoni Lapinski: Treating [name]/Extender: [name] [name] in Treatment: 73 Encounter Discharge Information Items Post Procedure Vitals Discharge Condition: Stable Temperature (F): 98.6 Ambulatory Status: Wheelchair Pulse (bpm): 76 Discharge Destination: Home Respiratory Rate (breaths/min): 20 Transportation: Private Auto Blood Pressure (mmHg): 117/63 Accompanied By: family members Schedule Follow-up Appointment: Yes Clinical Summary of Care: Electronic Signature(s) Signed: 09/20/2021 5:32:30 PM By: Deon Pilling RN, BSN Entered By: Deon Pilling on 09/20/2021 09:04:36 -------------------------------------------------------------------------------- Lower Extremity Assessment Details Patient Name: Date of Service: [name] [name], [name] 09/20/2021 8:15 A M Medical Record Number: [id number] Patient Account Number: [id number] Date of Birth/Sex: Treating RN: [name] (88 y.o. [name] [name] Primary Care [name] [name]: [name] Other Clinician: Referring [name] [name]: Treating [name] [name]/Extender: [name] [name] in Treatment: 52 Edema Assessment Assessed: [Left: Yes] [Right: Yes] Edema: [Left: Yes] [Right: Yes] Calf Left: Right: Point of Measurement: 34 cm From Medial Instep 40 cm 39 cm Ankle Left: Right: Point of Measurement: 11 cm From Medial Instep 25.5 cm 28 cm Electronic Signature(s) Signed: 09/20/2021 5:32:30 PM By: Deon Pilling RN, BSN Entered By: Deon Pilling on 09/20/2021 08:22:40 -------------------------------------------------------------------------------- Multi-Disciplinary Care Plan Details Patient Name: Date of Service: [name] [name], [name] 09/20/2021 8:15 A M Medical Record Number: [id number] Patient Account Number: [id number] Date of Birth/Sex: Treating RN: [name] [name] (88 y.o. [name] [name] Primary Care [name] [name]: [name] Other Clinician: Referring [name] [name]: Treating [name] [name]/Extender: [name] [name] in Treatment: 48 Active Inactive Abuse / Safety /  Falls / Self Care Management Nursing Diagnoses: Potential for falls Goals: Patient will remain injury free related to falls Date Initiated: 03/22/2021 Target Resolution Date: 10/13/2021 Goal Status: Active Interventions: Assess: immobility, friction, shearing, incontinence upon admission and as needed Assess impairment of mobility on admission and as needed per policy Assess self care needs on admission and as needed Provide education on fall prevention Notes: Nutrition Nursing Diagnoses: Potential  for alteratiion in Nutrition/Potential for imbalanced nutrition Goals: Patient/caregiver agrees to and verbalizes understanding of need to obtain nutritional consultation Date Initiated: 03/22/2021 T arget Resolution Date: 10/13/2021 Goal Status: Active Interventions: Provide education on nutrition Treatment Activities: Education provided on Nutrition : 09/13/2021 Giving encouragement to exercise : 03/22/2021 Notes: Electronic Signature(s) Signed: 09/20/2021 5:32:30 PM By: Deon Pilling RN, BSN Entered By: Deon Pilling on 09/20/2021 08:37:10 -------------------------------------------------------------------------------- Pain Assessment Details Patient Name: Date of Service: [name] [name], [name] 09/20/2021 8:15 A M Medical Record Number: [id number] Patient Account Number: [id number] Date of Birth/Sex: Treating RN: [name] (88 y.o. [name] [name] Primary Care [name] [name]: [name] Other Clinician: Referring [name] [name]: Treating [name]/Extender: [name] [name] in Treatment: 34 Active Problems Location of Pain Severity and Description of Pain Patient Has Paino No Site Locations Rate the pain. Current Pain Level: 0 Pain Management and Medication Current Pain Management: Medication: No Cold Application: No Rest: No Massage: No Activity: No T.E.N.S.: No Heat Application: No Leg drop or elevation: No Is the Current Pain Management Adequate:  Adequate How does your wound impact your activities of daily livingo Sleep: No Bathing: No Appetite: No Relationship With Others: No Bladder Continence: No Emotions: No Bowel Continence: No Work: No Toileting: No Drive: No Dressing: No Hobbies: No Engineer, maintenance) Signed: 09/20/2021 5:32:30 PM By: Deon Pilling RN, BSN Entered By: Deon Pilling on 09/20/2021 08:14:01 -------------------------------------------------------------------------------- Patient/Caregiver Education Details Patient Name: Date of Service: [name] [name] ID I. 9/20/2023andnbsp8:15 A M Medical Record Number: [id number] Patient Account Number: [id number] Date of Birth/Gender: Treating RN: [name] (88 y.o. [name] [name] Primary Care Physician: [name] Other Clinician: Referring Physician: Treating Physician/Extender: [name] [name] in Treatment: 50 Education Assessment Education Provided To: Patient Education Topics Provided Nutrition: Handouts: Nutrition Methods: Explain/Verbal Responses: Reinforcements needed Electronic Signature(s) Signed: 09/20/2021 5:32:30 PM By: Deon Pilling RN, BSN Entered By: Deon Pilling on 09/20/2021 08:37:22 -------------------------------------------------------------------------------- Wound Assessment Details Patient Name: Date of Service: [name] [name], [name] 09/20/2021 8:15 A M Medical Record Number: [id number] Patient Account Number: [id number] Date of Birth/Sex: Treating RN: [name] (88 y.o. [name], [name] Primary Care Kiarrah Rausch: [name] Other Clinician: Referring Shanisha Lech: Treating Yenifer Saccente/Extender: [name] [name] in Treatment: 5 Wound Status Wound Number: 14 Primary Open Surgical Wound Etiology: Wound Location: Left, Anterior Lower Leg Wound Open Wounding Event: Surgical Injury Status: Date Acquired: 05/26/2021 Comorbid Cataracts, Sleep Apnea, Arrhythmia, Congestive Heart  Failure, [name] Of Treatment: 11 History: Coronary Artery Disease, Hypertension, Myocardial Infarction, Clustered Wound: No End Stage Renal Disease, Gout, Dementia, Neuropathy Photos Wound Measurements Length: (cm) 2 Width: (cm) 1.6 Depth: (cm) 0.1 Area: (cm) 2.513 Volume: (cm) 0.251 % Reduction in Area: 36% % Reduction in Volume: 68% Epithelialization: Medium (34-66%) Tunneling: No Undermining: No Wound Description Classification: Full Thickness Without Exposed Support Structures Wound Margin: Epibole Exudate Amount: Medium Exudate Type: Serosanguineous Exudate Color: red, brown Foul Odor After Cleansing: No Slough/Fibrino Yes Wound Bed Granulation Amount: Large (67-100%) Exposed Structure Granulation Quality: Red, Hyper-granulation, Friable Fascia Exposed: No Necrotic Amount: Small (1-33%) Fat Layer (Subcutaneous Tissue) Exposed: Yes Necrotic Quality: Adherent Slough Tendon Exposed: No Muscle Exposed: No Joint Exposed: No Bone Exposed: No Treatment Notes Wound #14 (Lower Leg) Wound Laterality: Left, Anterior Cleanser Soap and Water Discharge Instruction: May shower and wash wound with dial antibacterial soap and water prior to dressing change. Peri-Wound Care Skin Prep Discharge Instruction: Use skin prep  as directed Topical Primary Dressing Promogran Prisma Matrix, 4.34 (sq in) (silver collagen) Discharge Instruction: Moisten collagen with hydrogel or KY jelly. Secondary Dressing Woven Gauze Sponge, Non-Sterile 4x4 in Discharge Instruction: Apply over primary dressing as directed. Secured With [employer] 4x5 (in/yd) Discharge Instruction: Secure with Coban ONLY ONE LAYER AROUND LEG.**** Kerlix Roll Sterile, 4.5x3.1 (in/yd) Discharge Instruction: Secure with Kerlix as directed. 60M Medipore H Soft Cloth Surgical T ape, 4 x 10 (in/yd) Discharge Instruction: Secure with tape as directed. Compression Wrap Compression  Stockings Add-Ons Electronic Signature(s) Signed: 09/20/2021 5:32:30 PM By: Deon Pilling RN, BSN Signed: 09/20/2021 5:32:30 PM By: Deon Pilling RN, BSN Entered By: Deon Pilling on 09/20/2021 08:28:26 -------------------------------------------------------------------------------- Wound Assessment Details Patient Name: Date of Service: [name] [name], [name] 09/20/2021 8:15 A M Medical Record Number: [id number] Patient Account Number: [id number] Date of Birth/Sex: Treating RN: [birth year][name] (88 y.o. [name], [name] Primary Care Amberlyn Martinezgarcia: [name] Other Clinician: Referring Murry Diaz: Treating Maxton Noreen/Extender: [name] [name] in Treatment: 71 Wound Status Wound Number: 15 Primary Pressure Ulcer Etiology: Wound Location: Right Calcaneus Secondary Abrasion Wounding Event: Trauma Etiology: Date Acquired: 07/12/2021 Wound Open [name] Of Treatment: 9 Status: Clustered Wound: No Comorbid Cataracts, Sleep Apnea, Arrhythmia, Congestive Heart Failure, History: Coronary Artery Disease, Hypertension, Myocardial Infarction, End Stage Renal Disease, Gout, Dementia, Neuropathy Photos Wound Measurements Length: (cm) 1.7 Width: (cm) 3.2 Depth: (cm) 0.1 Area: (cm) 4.273 Volume: (cm) 0.427 % Reduction in Area: 60.4% % Reduction in Volume: 60.5% Epithelialization: Small (1-33%) Tunneling: No Undermining: No Wound Description Classification: Category/Stage III Wound Margin: Distinct, outline attached Exudate Amount: Medium Exudate Type: Serosanguineous Exudate Color: red, brown Foul Odor After Cleansing: No Slough/Fibrino Yes Wound Bed Granulation Amount: Medium (34-66%) Exposed Structure Granulation Quality: Red, Pink Fascia Exposed: No Necrotic Amount: Small (1-33%) Fat Layer (Subcutaneous Tissue) Exposed: Yes Necrotic Quality: Eschar, Adherent Slough Tendon Exposed: No Muscle Exposed: No Joint Exposed: No Bone Exposed: No Treatment Notes Wound #15  (Calcaneus) Wound Laterality: Right Cleanser Soap and Water Discharge Instruction: May shower and wash wound with dial antibacterial soap and water prior to dressing change. Wound Cleanser Discharge Instruction: Cleanse the wound with wound cleanser prior to applying a clean dressing using gauze sponges, not tissue or cotton balls. Peri-Wound Care Topical Primary Dressing Xeroform Occlusive Gauze Dressing, 4x4 in Discharge Instruction: Apply to wound bed as instructed Secondary Dressing ALLEVYN Heel 4 1/2in x 5 1/2in / 10.5cm x 13.5cm Discharge Instruction: Apply over primary dressing as directed. T Non-adherent Dressing, 2x3 in elfa Discharge Instruction: Apply over primary dressing as directed. Woven Gauze Sponge, Non-Sterile 4x4 in Discharge Instruction: ***PLEASE SEND BULK.***Apply over primary dressing as directed. Secured With [employer], 4.5x3.1 (in/yd) Discharge Instruction: Secure with Kerlix as directed. Compression Wrap Compression Stockings Add-Ons Electronic Signature(s) Signed: 09/20/2021 5:32:30 PM By: Deon Pilling RN, BSN Entered By: Deon Pilling on 09/20/2021 08:28:46 -------------------------------------------------------------------------------- Wound Assessment Details Patient Name: Date of Service: [name] [name], [name] 09/20/2021 8:15 A M Medical Record Number: [id number] Patient Account Number: [id number] Date of Birth/Sex: Treating RN: [name][birth year] (88 y.o. [name] [name] Primary Care Altovise Wahler: [name] Other Clinician: Referring Demond Shallenberger: Treating Johnye Kist/Extender: [name] [name] in Treatment: 59 Wound Status Wound Number: 19 Primary Abrasion Etiology: Wound Location: Left, Medial Lower Leg Wound Open Wounding Event: Gradually Appeared Status: Date Acquired: 09/06/2021 Comorbid Cataracts, Sleep Apnea, Arrhythmia, Congestive Heart Failure, [name] Of Treatment: 1 History: Coronary Artery Disease, Hypertension,  Myocardial Infarction, Clustered Wound: No End Stage Renal Disease, Gout, Dementia, Neuropathy Photos Wound Measurements Length: (cm) 3.7 Width: (cm) 2 Depth: (cm) 0.1 Area: (cm) 5.812 Volume: (cm) 0.581 % Reduction in Area: 29.1% % Reduction in Volume: 29.1% Epithelialization: Large (67-100%) Tunneling: No Undermining: No Wound Description Classification: Full Thickness Without Exposed Support Structures Wound Margin: Distinct, outline attached Exudate Amount: Medium Exudate Type: Serosanguineous Exudate Color: red, brown Foul Odor After Cleansing: No Slough/Fibrino No Wound Bed Granulation Amount: Large (67-100%) Exposed Structure Granulation Quality: Red, Friable Fascia Exposed: No Necrotic Amount: None Present (0%) Fat Layer (Subcutaneous Tissue) Exposed: Yes Tendon Exposed: No Muscle Exposed: No Joint Exposed: No Bone Exposed: No Treatment Notes Wound #19 (Lower Leg) Wound Laterality: Left, Medial Cleanser Soap and Water Discharge Instruction: May shower and wash wound with dial antibacterial soap and water prior to dressing change. Wound Cleanser Discharge Instruction: Cleanse the wound with wound cleanser prior to applying a clean dressing using gauze sponges, not tissue or cotton balls. Peri-Wound Care Topical Primary Dressing Xeroform Occlusive Gauze Dressing, 4x4 in Discharge Instruction: Apply to wound bed as instructed Secondary Dressing Woven Gauze Sponge, Non-Sterile 4x4 in Discharge Instruction: Apply over primary dressing as directed. Secured With [employer] 4x5 (in/yd) Discharge Instruction: Secure with Coban ONLY ONE LAYER AROUND LEG.**** Kerlix Roll Sterile, 4.5x3.1 (in/yd) Discharge Instruction: Secure with Kerlix as directed. 47M Medipore H Soft Cloth Surgical T ape, 4 x 10 (in/yd) Discharge Instruction: Secure with tape as directed. Compression Wrap Compression Stockings Add-Ons Electronic Signature(s) Signed: 09/20/2021  5:32:30 PM By: Deon Pilling RN, BSN Entered By: Deon Pilling on 09/20/2021 08:29:04 -------------------------------------------------------------------------------- Wound Assessment Details Patient Name: Date of Service: [name] [name], [name] 09/20/2021 8:15 A M Medical Record Number: [id number] Patient Account Number: [id number] Date of Birth/Sex: Treating RN: [birth year][name] (88 y.o. [name], [name] Primary Care Suhani Stillion: Other Clinician: [name] Referring Jasia Hiltunen: Treating Adlyn Fife/Extender: [name] [name] in Treatment: 64 Wound Status Wound Number: 20 Primary Abrasion Etiology: Wound Location: Left T Second oe Wound Open Wounding Event: Trauma Status: Date Acquired: 09/06/2021 Comorbid Cataracts, Sleep Apnea, Arrhythmia, Congestive Heart Failure, [name] Of Treatment: 0 History: Coronary Artery Disease, Hypertension, Myocardial Infarction, Clustered Wound: No End Stage Renal Disease, Gout, Dementia, Neuropathy Photos Wound Measurements Length: (cm) 0.4 Width: (cm) 0.5 Depth: (cm) 0.1 Area: (cm) 0.157 Volume: (cm) 0.016 % Reduction in Area: 0% % Reduction in Volume: 0% Epithelialization: None Tunneling: No Undermining: No Wound Description Classification: Full Thickness Without Exposed Support Structures Wound Margin: Distinct, outline attached Exudate Amount: Small Exudate Type: Serosanguineous Exudate Color: red, brown Foul Odor After Cleansing: No Slough/Fibrino Yes Wound Bed Granulation Amount: None Present (0%) Exposed Structure Necrotic Amount: Large (67-100%) Fascia Exposed: No Necrotic Quality: Adherent Slough Fat Layer (Subcutaneous Tissue) Exposed: No Tendon Exposed: No Muscle Exposed: No Joint Exposed: No Bone Exposed: No Treatment Notes Wound #20 (Toe Second) Wound Laterality: Left Cleanser Soap and Water Discharge Instruction: May shower and wash wound with dial antibacterial soap and water prior to dressing  change. Wound Cleanser Discharge Instruction: Cleanse the wound with wound cleanser prior to applying a clean dressing using gauze sponges, not tissue or cotton balls. Peri-Wound Care Topical Primary Dressing Xeroform Occlusive Gauze Dressing, 4x4 in Discharge Instruction: Apply to wound bed as instructed Secondary Dressing bandaid Secured With Compression Wrap Compression Stockings Add-Ons Electronic Signature(s) Signed: 09/20/2021 5:32:30 PM By: Deon Pilling RN, BSN Entered By: Deon Pilling on 09/20/2021 08:30:50 -------------------------------------------------------------------------------- Wound Assessment Details Patient  Name: Date of Service: [name] [name] ID I. 09/20/2021 8:15 A M Medical Record Number: [id number] Patient Account Number: [id number] Date of Birth/Sex: Treating RN: [name] (88 y.o. [name], [name] Primary Care Anh Bigos: [name] Other Clinician: Referring Lateka Rady: Treating Talishia Betzler/Extender: [name] [name] in Treatment: 64 Wound Status Wound Number: 21 Primary Abrasion Etiology: Wound Location: Right T Second oe Wound Open Wounding Event: Trauma Status: Date Acquired: 09/13/2021 Comorbid Cataracts, Sleep Apnea, Arrhythmia, Congestive Heart Failure, [name] Of Treatment: 0 History: Coronary Artery Disease, Hypertension, Myocardial Infarction, Clustered Wound: No End Stage Renal Disease, Gout, Dementia, Neuropathy Photos Wound Measurements Length: (cm) 0.4 Width: (cm) 0.5 Depth: (cm) 0.1 Area: (cm) 0.157 Volume: (cm) 0.016 % Reduction in Area: 0% % Reduction in Volume: 0% Epithelialization: None Tunneling: No Undermining: No Wound Description Classification: Full Thickness Without Exposed Support Structures Wound Margin: Distinct, outline attached Exudate Amount: Medium Exudate Type: Serosanguineous Exudate Color: red, brown Foul Odor After Cleansing: No Slough/Fibrino Yes Wound Bed Granulation Amount:  Small (1-33%) Exposed Structure Granulation Quality: Pink Fascia Exposed: No Necrotic Amount: Large (67-100%) Fat Layer (Subcutaneous Tissue) Exposed: Yes Necrotic Quality: Adherent Slough Tendon Exposed: No Muscle Exposed: No Joint Exposed: No Bone Exposed: No Treatment Notes Wound #21 (Toe Second) Wound Laterality: Right Cleanser Soap and Water Discharge Instruction: May shower and wash wound with dial antibacterial soap and water prior to dressing change. Wound Cleanser Discharge Instruction: Cleanse the wound with wound cleanser prior to applying a clean dressing using gauze sponges, not tissue or cotton balls. Peri-Wound Care Topical Primary Dressing Xeroform Occlusive Gauze Dressing, 4x4 in Discharge Instruction: Apply to wound bed as instructed Secondary Dressing bandaid Secured With Compression Wrap Compression Stockings Add-Ons Electronic Signature(s) Signed: 09/20/2021 5:32:30 PM By: Deon Pilling RN, BSN Entered By: Deon Pilling on 09/20/2021 08:31:45 -------------------------------------------------------------------------------- Wound Assessment Details Patient Name: Date of Service: [name] [name], [name] 09/20/2021 8:15 A M Medical Record Number: [id number] Patient Account Number: [id number] Date of Birth/Sex: Treating RN: [birth year] [name] (88 y.o. [name], [name] Primary Care Jovonna Nickell: [name] Other Clinician: Referring Rhetta Cleek: Treating Amedeo Detweiler/Extender: [name] [name] in Treatment: 52 Wound Status Wound Number: 3 Primary Venous Leg Ulcer Etiology: Wound Location: Left, Lateral Ankle Wound Open Wounding Event: Gradually Appeared Status: Date Acquired: 10/12/2020 Comorbid Cataracts, Sleep Apnea, Arrhythmia, Congestive Heart Failure, [name] Of Treatment: 44 History: Coronary Artery Disease, Hypertension, Myocardial Infarction, Clustered Wound: No End Stage Renal Disease, Gout, Dementia, Neuropathy Photos Wound  Measurements Length: (cm) 1.4 Width: (cm) 0.4 Depth: (cm) 0.1 Area: (cm) 0.44 Volume: (cm) 0.044 % Reduction in Area: 12.5% % Reduction in Volume: 70.9% Epithelialization: Medium (34-66%) Tunneling: No Undermining: No Wound Description Classification: Full Thickness Without Exposed Support Structu Wound Margin: Epibole Exudate Amount: Medium Exudate Type: Serosanguineous Exudate Color: red, brown res Foul Odor After Cleansing: No Slough/Fibrino No Wound Bed Granulation Amount: Large (67-100%) Exposed Structure Granulation Quality: Red Fascia Exposed: No Necrotic Amount: None Present (0%) Fat Layer (Subcutaneous Tissue) Exposed: Yes Tendon Exposed: No Muscle Exposed: No Joint Exposed: No Bone Exposed: No Treatment Notes Wound #3 (Ankle) Wound Laterality: Left, Lateral Cleanser Soap and Water Discharge Instruction: May shower and wash wound with dial antibacterial soap and water prior to dressing change. Peri-Wound Care Skin Prep Discharge Instruction: Use skin prep as directed Topical Primary Dressing Promogran Prisma Matrix, 4.34 (sq in) (silver collagen) Discharge Instruction: Moisten collagen with hydrogel or KY jelly. Secondary Dressing Woven Gauze Sponge, Non-Sterile 4x4 in  Discharge Instruction: Apply over primary dressing as directed. Secured With [employer] 4x5 (in/yd) Discharge Instruction: Secure with Coban ONLY ONE LAYER AROUND LEG.**** Kerlix Roll Sterile, 4.5x3.1 (in/yd) Discharge Instruction: Secure with Kerlix as directed. 70M Medipore H Soft Cloth Surgical T ape, 4 x 10 (in/yd) Discharge Instruction: Secure with tape as directed. Compression Wrap Compression Stockings Add-Ons Electronic Signature(s) Signed: 09/20/2021 5:32:30 PM By: Deon Pilling RN, BSN Entered By: Deon Pilling on 09/20/2021 08:32:21 -------------------------------------------------------------------------------- Vitals Details Patient Name: Date of  Service: [name] [name], [name] 09/20/2021 8:15 A M Medical Record Number: [id number] Patient Account Number: [id number] Date of Birth/Sex: Treating RN: [birth year] (88 y.o. [name] [name] Primary Care Chistina Roston: [name] Other Clinician: Referring Dhairya Corales: Treating Nikolette Reindl/Extender: [name] [name] in Treatment: 83 Vital Signs Time Taken: 08:10 Temperature (F): 98.3 Height (in): 72 Pulse (bpm): 76 Weight (lbs): 223 Respiratory Rate (breaths/min): 20 Body Mass Index (BMI): 30.2 Blood Pressure (mmHg): 117/63 Reference Range: 80 - 120 mg / dl Electronic Signature(s) Signed: 09/20/2021 5:32:30 PM By: Deon Pilling RN, BSN Entered By: Deon Pilling on 09/20/2021 08:13:49

## 2021-09-25 ENCOUNTER — Ambulatory Visit: Payer: Medicare Other

## 2021-09-25 ENCOUNTER — Ambulatory Visit: Payer: Medicare Other | Admitting: Physical Therapy

## 2021-09-25 ENCOUNTER — Encounter: Payer: Self-pay | Admitting: Physical Therapy

## 2021-09-25 ENCOUNTER — Ambulatory Visit: Payer: Medicare Other | Admitting: Occupational Therapy

## 2021-09-25 DIAGNOSIS — R2689 Other abnormalities of gait and mobility: Secondary | ICD-10-CM | POA: Diagnosis not present

## 2021-09-25 DIAGNOSIS — R41841 Cognitive communication deficit: Secondary | ICD-10-CM | POA: Diagnosis not present

## 2021-09-25 DIAGNOSIS — R208 Other disturbances of skin sensation: Secondary | ICD-10-CM | POA: Diagnosis not present

## 2021-09-25 DIAGNOSIS — R262 Difficulty in walking, not elsewhere classified: Secondary | ICD-10-CM | POA: Diagnosis not present

## 2021-09-25 DIAGNOSIS — R2681 Unsteadiness on feet: Secondary | ICD-10-CM

## 2021-09-25 DIAGNOSIS — I69351 Hemiplegia and hemiparesis following cerebral infarction affecting right dominant side: Secondary | ICD-10-CM | POA: Diagnosis not present

## 2021-09-25 DIAGNOSIS — R4701 Aphasia: Secondary | ICD-10-CM

## 2021-09-25 DIAGNOSIS — R41840 Attention and concentration deficit: Secondary | ICD-10-CM

## 2021-09-25 DIAGNOSIS — M6281 Muscle weakness (generalized): Secondary | ICD-10-CM | POA: Diagnosis not present

## 2021-09-25 NOTE — Therapy (Signed)
OUTPATIENT OCCUPATIONAL THERAPY  TREATMENT NOTE  Patient Name: [name] [name] MRN: [id number] DOB:23-Jan-1926, 88 y.o., male Today's Date: 09/25/2021  PCP: [name], MD REFERRING PROVIDER: Charlett Blake, MD    OT End of Session - 09/25/21 1457     Visit Number 6    Number of Visits 17    Date for OT Re-Evaluation 10/27/21    Authorization Type BCBS Medicare    OT Start Time 1450    OT Stop Time 5929    OT Time Calculation (min) 40 min    Activity Tolerance Patient tolerated treatment well    Behavior During Therapy WFL for tasks assessed/performed               Past Medical History:  Diagnosis Date   AAA (abdominal aortic aneurysm) ([place])    Arthritis    Ascending aorta dilatation ([place]) 03/13/2012   Fusiform dilatation of the ascending thoracic aorta discovered during surgery, max diameter 4.2-4.3 cm   BPH (benign prostatic hypertrophy)    Chronic systolic heart failure ([place]) 03/03/2012   Chronic venous insufficiency    GERD (gastroesophageal reflux disease)    Glaucoma    Gout    History of kidney stones    Hyperlipidemia    Hypertension    Obesity (BMI 30-39.9)    Paroxysmal atrial fibrillation ([place]) 02/29/2012   Recurrent paroxysmal, new-onset    PMR (polymyalgia rheumatica) (HCC)    Polymyalgia rheumatica ([place]) 02/06/2019   Pre-diabetes    S/P CABG x 3 03/06/2012   LIMA to LAD, SVG to OM, SVG to RCA, EVH via right thigh   S/P Maze operation for atrial fibrillation 03/06/2012   Complete bilateral lesions set using bipolar radiofrequency and cryothermy ablation with clipping of LA appendage   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   s/p TAVR with a 26 mm Edwards via the left subclavian by Drs Buena Irish and Bartle   Severe aortic stenosis 09/29/2019   Sleep apnea    USES CPAP NIGHTLY   Past Surgical History:  Procedure Laterality Date   ABDOMINAL AORTOGRAM W/LOWER EXTREMITY N/A 10/26/2020   Procedure: ABDOMINAL AORTOGRAM  W/LOWER EXTREMITY;  Surgeon: Broadus John, MD;  Location: [facility];  Service: Cardiovascular;  Laterality: N/A;   CARDIAC CATHETERIZATION     CATARACT EXTRACTION, BILATERAL     with lens implants   CHOLECYSTECTOMY N/A 03/24/2012   Procedure: LAPAROSCOPIC CHOLECYSTECTOMY WITH INTRAOPERATIVE CHOLANGIOGRAM;  Surgeon: Zenovia Jarred, MD;  Location: [facility];  Service: General;  Laterality: N/A;   CORONARY ARTERY BYPASS GRAFT N/A 03/06/2012   Procedure: CORONARY ARTERY BYPASS GRAFTING (CABG);  Surgeon: Rexene Alberts, MD;  Location: [facility];  Service: Open Heart Surgery;  Laterality: N/A;   ENDOVEIN HARVEST OF GREATER SAPHENOUS VEIN Right 03/06/2012   Procedure: ENDOVEIN HARVEST OF GREATER SAPHENOUS VEIN;  Surgeon: Rexene Alberts, MD;  Location: [facility];  Service: Open Heart Surgery;  Laterality: Right;   INTRAOPERATIVE TRANSESOPHAGEAL ECHOCARDIOGRAM N/A 03/06/2012   Procedure: INTRAOPERATIVE TRANSESOPHAGEAL ECHOCARDIOGRAM;  Surgeon: Rexene Alberts, MD;  Location: [place];  Service: Open Heart Surgery;  Laterality: N/A;   IR KYPHO LUMBAR INC FX REDUCE BONE BX UNI/BIL CANNULATION INC/IMAGING  02/23/2020   LEFT HEART CATH Right 03/02/2012   Procedure: LEFT HEART CATH;  Surgeon: Sherren Mocha, MD;  Location: [facility] CATH [facility];  Service: Cardiovascular;  Laterality: Right;   MAZE N/A 03/06/2012   Procedure: MAZE;  Surgeon: Rexene Alberts, MD;  Location: [facility]  OR;  Service: Open Heart Surgery;  Laterality: N/A;   PERIPHERAL VASCULAR INTERVENTION Right 10/26/2020   Procedure: PERIPHERAL VASCULAR INTERVENTION;  Surgeon: Broadus John, MD;  Location: [facility];  Service: Cardiovascular;  Laterality: Right;   PROSTATECTOMY     partial   RIGHT/LEFT HEART CATH AND CORONARY/GRAFT ANGIOGRAPHY N/A 09/29/2019   Procedure: RIGHT/LEFT HEART CATH AND CORONARY/GRAFT ANGIOGRAPHY;  Surgeon: Adrian Prows, MD;  Location: [facility];  Service: Cardiovascular;  Laterality: N/A;   TEE WITHOUT CARDIOVERSION N/A  11/10/2019   Procedure: TRANSESOPHAGEAL ECHOCARDIOGRAM (TEE);  Surgeon: Burnell Blanks, MD;  Location: [place];  Service: Open Heart Surgery;  Laterality: N/A;   TRANSCATHETER AORTIC VALVE REPLACEMENT, TRANSFEMORAL  11/10/2019   Patient Active Problem List   Diagnosis Date Noted   Left middle cerebral artery stroke ([place]) 05/31/2021   Acute CVA (cerebrovascular accident) ([place]) 05/25/2021   DNR (do not resuscitate) 05/25/2021   Osteoporosis 05/05/2021   Gouty arthritis 02/13/2021   Morbid obesity ([place]) 02/13/2021   Abdominal aortic aneurysm without rupture ([place]) 11/22/2020   Acute renal failure syndrome ([place]) 11/22/2020   Low back pain 11/22/2020   Skin tear of right upper arm without complication 95/09/3265   Pressure injury of skin 10/23/2020   Urinary tract infection with hematuria 10/22/2020   Encephalopathy 10/22/2020   AKI (acute kidney injury) ([place])    Overweight (BMI 25.0-29.9) 08/19/2020   Vertebral fracture, osteoporotic ([place]) 08/18/2020   T8 vertebral fracture ([place]) 08/17/2020   Thrombocytopenia ([place]) 08/17/2020   CKD (chronic kidney disease) stage 3, GFR 30-59 ml/min (HCC) 08/17/2020   Elevated troponin 08/17/2020   Pain due to onychomycosis of toenails of both feet 07/27/2020   Callus of heel 07/27/2020   Bifascicular block 11/11/2019   Acute on chronic diastolic heart failure ([place]) 11/10/2019   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   Severe aortic stenosis 09/29/2019   Aortic valve disorder 12/45/8099   Systolic heart failure ([place]) 03/11/2019   Thrombophilia ([place]) 02/26/2019   Muscle pain 02/11/2019   Weakness 02/06/2019   Polymyalgia rheumatica ([place]) 02/06/2019   Shoulder joint pain 02/02/2019   Anemia 01/26/2019   Malignant melanoma of skin ([place]) 01/26/2019   Pain in right leg 01/26/2019   DDD (degenerative disc disease), cervical 05/28/2018   Encounter for general adult medical examination without abnormal findings 11/19/2017    Carotid artery occlusion 07/03/2017   Hearing loss 07/03/2017   Fall 06/19/2017   Injury of upper extremity 06/19/2017   Neck pain 06/19/2017   Headache 06/19/2017   Visual disturbance 06/19/2017   Ganglion of hand 05/08/2017   Dilatation of aorta ([place]) 02/14/2017   Plantar fascial fibromatosis 02/14/2017   Cardiac murmur, unspecified 02/27/2016   Benign neoplasm of colon 08/24/2015   Dementia ([place]) 08/24/2015   Influenza due to unidentified influenza virus with other respiratory manifestations 03/15/2015   Epistaxis 12/01/2014   OSA (obstructive sleep apnea) 11/18/2014   Gastro-esophageal reflux disease with esophagitis 10/29/2014   Angina pectoris ([place]) 05/04/2014   Abnormal feces 07/31/2013   Other specified disorders of gingiva and edentulous alveolar ridge 07/31/2013   Spontaneous ecchymosis 10/10/2012   Thoracic ascending aortic aneurysm ([place]) 09/29/2012   Microscopic hematuria 04/11/2012   Proteinuria 04/11/2012   S/P laparoscopic cholecystectomy 04/09/2012   S/P CABG x 3 03/06/2012   S/P Maze operation for atrial fibrillation 03/06/2012   Paroxysmal atrial fibrillation ([place]) 02/29/2012   Bitten or stung by nonvenomous insect and other nonvenomous arthropods, initial encounter 12/03/2011  Depression screening 10/17/2011   CAD (coronary artery disease), native coronary artery    Hyperlipidemia    Hypertension    Chronic venous insufficiency    Gout    Benign prostatic hyperplasia    Post-traumatic stress disorder 03/13/2011   Pain in left leg 12/08/2010   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   Fatigue 08/08/2010   Retention of urine 04/18/2010   Constipation 07/05/2009   Kidney stone 07/05/2009   ED (erectile dysfunction) of organic origin 12/14/2008   Impaired fasting glucose 12/14/2008   Vitamin B deficiency 12/14/2008    ONSET DATE: 05/25/2021  REFERRING DIAG: G66.599 (ICD-10-CM) - Left middle cerebral artery stroke   THERAPY DIAG:  Hemiplegia and  hemiparesis following cerebral infarction affecting right dominant side (HCC)  Attention and concentration deficit  Other disturbances of skin sensation  Muscle weakness (generalized)  Rationale for Evaluation and Treatment Rehabilitation  SUBJECTIVE:   SUBJECTIVE STATEMENT: Pt reports having difficulty setting up a new password on his computer.  "I had to do it over and over again to make it work right." He reports that it took him 30 minutes to complete. Pt accompanied by: self and significant other (dropped off pt)  PERTINENT HISTORY:  H/o AAA, CAD with CABG, compression fractures, chronic anemia followed by hematology services, chronic systolic congestive heart failure, A-fib/AF status post MAZE on Xarelto, hypertension, hyperlipidemia, PMR maintained on steroids, prediabetes, early dementia maintained on Namenda, chronic lower extremity wounds with [place] boot OSA with CPAP, PAD with lymphedema status post balloon angioplasty of posterior tibial artery October 2022 recent UTI placed on antibiotic therapy.  He does have a personal care attendant Monday Wednesday Friday for 3 hours.  Modified independent transfers power wheelchair bound x3 to 4 months prior to that ambulating short distances with a rollator.  Present 05/25/2021 with right-sided weakness and aphasia of acute onset.  Cranial CT scan showed small left MCA territory cortical infarct within the mid to posterior left frontal lobe.  CT angiogram head and neck no large vessel occlusion.   PRECAUTIONS: Fall  PAIN:  Are you having pain? Yes: NPRS scale: 3/10 Pain location: mid back Pain description: constant; dull, achy Aggravating factors: standing, certain movements/exercises Relieving factors: injections, flat on back  FALLS: Has patient fallen in last 6 months? Yes. Number of falls 4, has not fallen recently, per pt report  LIVING ENVIRONMENT: Lives with: lives with their spouse Lives in: House/apartment Stairs:  Yes: steps  on front and back of house, however has an elevator at the back, steps to 2nd floor but does not need to go upstairs Has following equipment at home: Environmental consultant - 2 wheeled, Environmental consultant - 4 wheeled, Wheelchair (power), Wheelchair (manual), shower chair, bed side commode, Grab bars, and elevator to access home from outside, hand held shower head  PLOF:  Has a caregiver 3x/week that assists with ADLs, d/t spinal compression fx pt has become more w/c bound in 5-6 months due to    [place] to talk properly; to write and to spell  OBJECTIVE:   HAND DOMINANCE: Right  ADLs: Dressing: Setup w/ UB dressing, wife assists w/ pants, socks, shoes Grooming: Mod I from seated position Eating: reports great difficulty using a knife to cut food, wife frequently cuts foods for him Toileting: Occ difficulty pulling pants back up after toileting, leans forward to complete hygiene Bathing: Supervision/setup  Tub Shower transfers: caregiver assist w/ transfers in/out of shower providing SPV and occ Min A Equipment: Shower seat with back, Grab bars,  Walk in shower, bed side commode, Reacher, and Sock aid  IADLs: Financial management: Reports difficulty with writing a check, math, spelling, but did most of financial management Handwriting: 50% legible and difficulty with spelling that has gotten worse since CVA  MOBILITY STATUS: Needs Assist: Mod I stand pivot from w/c, Supervision for shower transfers  FUNCTIONAL OUTCOME MEASURES: FOTO: TBD at next session  UPPER EXTREMITY MMT:     MMT Right eval Left eval  Shoulder flexion 4+/5 4+/5  Shoulder abduction    Shoulder adduction    Shoulder extension    Shoulder internal rotation    Shoulder external rotation    Middle trapezius    Lower trapezius    Elbow flexion 4+/5 4+/5  Elbow extension 4+/5 4+/5  Wrist flexion    Wrist extension    Wrist ulnar deviation    Wrist radial deviation    Wrist pronation    Wrist supination    (Blank rows = not  tested)  HAND FUNCTION: Grip strength: Right: 30 lbs; Left: 30 lbs  COORDINATION: 9 Hole Peg test: Right: 1:06.53 sec; Left: 34.53 sec 09/20/21: Right: 56.15 sec  SENSATION: Reports numbness and difficulty maintaining grasp on spoon/fork and writing utensil  -------------------------------------------------------------------------------------------------------------------------------------------------------- (objective measures above completed at initial evaluation unless otherwise dated)  TODAY'S TREATMENT: 09/25/21 Pipe tree puzzle: replication of pattern with focus on BUE use, sequencing, organization, and problem solving. Pt required initial pattern and demonstration of coding to correctly identify each piece.  Pt able to complete with mild increase in time, however making no errors. Categorizing: provided with list of items and directed to place in categories according to where he would purchase items.  Task facilitating handwriting, thinking skills of organization and sequencing. Pt with letter reversals such as "b, d, and p".  OT then challenged pt to estimate how much money he would spend at each store.  Pt able to complete systematically and able to report rationale for prices as needed.    09/20/21 Handwriting: engaged in pre-writing activities with dot to dot and maze puzzles with focus on coordination and manipulation of pen during pre-writing strokes to facilitate carryover to writing tasks.  Discussed and showed pt pictures to purchase of built up handles/"pencil grips" to allow for improved grip on writing utensils as hand "slips" when holding skinny pen/pencil. Pt demonstrating mod improvements with larger circumference pens. Alternating attention: Engaged in small peg board pattern replication with use of key to identify correlating colors, challenging alternating attention, visual perception, and R hand coordination and dexterity. Pt initially demonstrating decreased  organization with visual scanning, OT providing cues and demonstration with pt then demonstrating improved organization and sequencing. Pt demonstrating mild-mod difficulty with manipulation of pegs in-hand to place in peg board, largely due to small size of pegs. Educated on functional carryover of task implications with systematic process to minimize onset of errors. Completed 9 hole peg test at end of session with improvements noted (see above for measurements).   PATIENT EDUCATION: Ongoing condition-specific education related to therapeutic interventions completed this session Person educated: Patient Education method: Explanation, Demonstration, and Handouts Education comprehension: verbalized understanding   HOME EXERCISE PROGRAM: Access Code: [email or web address] URL: [email or web address] Date: 09/11/2021 Prepared by: [facility]  Exercises - Putty Squeezes  - 1 x daily - 1 sets - 10 reps - Rolling Putty on Table  - 1 x daily - 1 sets - 10 reps - Thumb Opposition with Putty  - 1 x  daily - 1 sets - 10 reps - Key Pinch with Putty  - 1 x daily - 1 sets - 10 reps - Tip Pinch with Putty  - 1 x daily - 1 sets - 10 reps - Removing Marbles from Putty  - 1 x daily - 1 sets - 10 reps  GOALS: Goals reviewed with patient? Yes  SHORT TERM GOALS: Target date: 09/29/2021  Pt will verbalize understanding of adapted strategies and/or potential AE needs to increase ease, safety, and independence w/ LB dressing. Baseline: Goal status: IN PROGRESS  2.  Pt will be independent with FMC/GMC and strengthening HEP to increase independence with handwriting and cutting of foods. Baseline:  Goal status: IN PROGRESS  3. Pt will demonstrate improved fine motor coordination for ADLs as evidenced by decreasing 9 hole peg test score for RUE by 5 secs  Baseline: 9 Hole Peg test: Right: 1:06.53 sec; Left: 34.53 sec  Goal status: MET - 56.15 sec   LONG TERM GOALS:  Target date: 10/27/2021  Pt will report ability to complete LB dressing with supervision for standing balance and use of AE as needed. Baseline:  Goal status: IN PROGRESS  2.  Pt will demonstrated improved standing balance to stand for 1 min with alternating UE to allow for increased independence with LB dressing/clothing management post toileting. Baseline:  Goal status: IN PROGRESS  3.  Pt will demonstrated improved working memory and recall during check writing/balancing check book by making 0 errors. Baseline:  Goal status: IN PROGRESS  4.  Pt will demonstrate improved fine motor coordination for ADLs as evidenced by decreasing 9 hole peg test score for RUE by 20 secs Baseline: 9 Hole Peg test: Right: 1:06.53 sec; Left: 34.53 sec Goal status: REVISED - upgraded 09/20/21 due to completing in 56.15 sec   5.  Pt will write a short paragraph with 90% legibility and no significant errors in spelling as evidenced by </= to 3 spelling errors. Baseline:  Goal status: IN PROGRESS   ASSESSMENT:  CLINICAL IMPRESSION: Treatment session with focus on alternating attention during categorization activity due to reports of difficulty with organizing work papers. Focus placed on sequencing, organization, handwriting, and mental math. Pt continues to demonstrate letter reversals particularly with "b, d, and p" due to aphasia.  Pt demonstrating improved legibility with standard pen this session.   PERFORMANCE DEFICITS in functional skills including ADLs, IADLs, coordination, sensation, ROM, strength, pain, FMC, GMC, balance, body mechanics, endurance, decreased knowledge of use of DME, and UE functional use, cognitive skills including attention, problem solving, sequencing, and thought, and psychosocial skills including coping strategies, environmental adaptation, and routines and behaviors.   IMPAIRMENTS are limiting patient from ADLs and IADLs.   COMORBIDITIES may have co-morbidities  that affects  occupational performance. Patient will benefit from skilled OT to address above impairments and improve overall function.  PLAN: OT FREQUENCY: 2x/week  OT DURATION: 8 weeks  PLANNED INTERVENTIONS: self care/ADL training, therapeutic exercise, therapeutic activity, neuromuscular re-education, balance training, functional mobility training, moist heat, cryotherapy, patient/family education, cognitive remediation/compensation, psychosocial skills training, energy conservation, coping strategies training, and DME and/or AE instructions  RECOMMENDED OTHER SERVICES: N/A  CONSULTED AND AGREED WITH PLAN OF CARE: Patient  PLAN FOR NEXT SESSION: review coordination HEP if needed, [place] tasks to address handwriting and other alternating attention tasks, word games/association for aphasia and carryover to spelling    Esra Frankowski, [place], OTR/L 09/25/2021, 2:57 PM

## 2021-09-25 NOTE — Therapy (Signed)
OUTPATIENT SPEECH LANGUAGE PATHOLOGY TREATMENT   Patient Name: [name] [name] MRN: [id number] DOB:1926/06/23, 88 y.o., male Today's Date: 09/25/2021  PCP: [name], MD REFERRING PROVIDER: Alysia Penna, MD   End of Session - 09/25/21 1451     Visit Number 4    Number of Visits 17    Date for SLP Re-Evaluation 10/27/21    SLP Start Time 1406    SLP Stop Time  1446    SLP Time Calculation (min) 40 min    Activity Tolerance Patient tolerated treatment well               Past Medical History:  Diagnosis Date   AAA (abdominal aortic aneurysm) ([place])    Arthritis    Ascending aorta dilatation ([place]) 03/13/2012   Fusiform dilatation of the ascending thoracic aorta discovered during surgery, max diameter 4.2-4.3 cm   BPH (benign prostatic hypertrophy)    Chronic systolic heart failure ([place]) 03/03/2012   Chronic venous insufficiency    GERD (gastroesophageal reflux disease)    Glaucoma    Gout    History of kidney stones    Hyperlipidemia    Hypertension    Obesity (BMI 30-39.9)    Paroxysmal atrial fibrillation ([place]) 02/29/2012   Recurrent paroxysmal, new-onset    PMR (polymyalgia rheumatica) (HCC)    Polymyalgia rheumatica ([place]) 02/06/2019   Pre-diabetes    S/P CABG x 3 03/06/2012   LIMA to LAD, SVG to OM, SVG to RCA, EVH via right thigh   S/P Maze operation for atrial fibrillation 03/06/2012   Complete bilateral lesions set using bipolar radiofrequency and cryothermy ablation with clipping of LA appendage   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   s/p TAVR with a 26 mm Edwards via the left subclavian by Drs Buena Irish and Bartle   Severe aortic stenosis 09/29/2019   Sleep apnea    USES CPAP NIGHTLY   Past Surgical History:  Procedure Laterality Date   ABDOMINAL AORTOGRAM W/LOWER EXTREMITY N/A 10/26/2020   Procedure: ABDOMINAL AORTOGRAM W/LOWER EXTREMITY;  Surgeon: Broadus John, MD;  Location: [facility];  Service:  Cardiovascular;  Laterality: N/A;   CARDIAC CATHETERIZATION     CATARACT EXTRACTION, BILATERAL     with lens implants   CHOLECYSTECTOMY N/A 03/24/2012   Procedure: LAPAROSCOPIC CHOLECYSTECTOMY WITH INTRAOPERATIVE CHOLANGIOGRAM;  Surgeon: Zenovia Jarred, MD;  Location: [facility];  Service: General;  Laterality: N/A;   CORONARY ARTERY BYPASS GRAFT N/A 03/06/2012   Procedure: CORONARY ARTERY BYPASS GRAFTING (CABG);  Surgeon: Rexene Alberts, MD;  Location: [facility];  Service: Open Heart Surgery;  Laterality: N/A;   ENDOVEIN HARVEST OF GREATER SAPHENOUS VEIN Right 03/06/2012   Procedure: ENDOVEIN HARVEST OF GREATER SAPHENOUS VEIN;  Surgeon: Rexene Alberts, MD;  Location: [facility];  Service: Open Heart Surgery;  Laterality: Right;   INTRAOPERATIVE TRANSESOPHAGEAL ECHOCARDIOGRAM N/A 03/06/2012   Procedure: INTRAOPERATIVE TRANSESOPHAGEAL ECHOCARDIOGRAM;  Surgeon: Rexene Alberts, MD;  Location: [facility];  Service: Open Heart Surgery;  Laterality: N/A;   IR KYPHO LUMBAR INC FX REDUCE BONE BX UNI/BIL CANNULATION INC/IMAGING  02/23/2020   LEFT HEART CATH Right 03/02/2012   Procedure: LEFT HEART CATH;  Surgeon: Sherren Mocha, MD;  Location: [facility];  Service: Cardiovascular;  Laterality: Right;   MAZE N/A 03/06/2012   Procedure: MAZE;  Surgeon: Rexene Alberts, MD;  Location: [facility];  Service: Open Heart Surgery;  Laterality: N/A;   PERIPHERAL VASCULAR INTERVENTION Right 10/26/2020   Procedure:  PERIPHERAL VASCULAR INTERVENTION;  Surgeon: Broadus John, MD;  Location: [facility];  Service: Cardiovascular;  Laterality: Right;   PROSTATECTOMY     partial   RIGHT/LEFT HEART CATH AND CORONARY/GRAFT ANGIOGRAPHY N/A 09/29/2019   Procedure: RIGHT/LEFT HEART CATH AND CORONARY/GRAFT ANGIOGRAPHY;  Surgeon: Adrian Prows, MD;  Location: [facility];  Service: Cardiovascular;  Laterality: N/A;   TEE WITHOUT CARDIOVERSION N/A 11/10/2019   Procedure: TRANSESOPHAGEAL ECHOCARDIOGRAM (TEE);  Surgeon: Burnell Blanks,  MD;  Location: [facility];  Service: Open Heart Surgery;  Laterality: N/A;   TRANSCATHETER AORTIC VALVE REPLACEMENT, TRANSFEMORAL  11/10/2019   Patient Active Problem List   Diagnosis Date Noted   Left middle cerebral artery stroke ([place]) 05/31/2021   Acute CVA (cerebrovascular accident) ([place]) 05/25/2021   DNR (do not resuscitate) 05/25/2021   Osteoporosis 05/05/2021   Gouty arthritis 02/13/2021   Morbid obesity ([place]) 02/13/2021   Abdominal aortic aneurysm without rupture ([place]) 11/22/2020   Acute renal failure syndrome ([place]) 11/22/2020   Low back pain 11/22/2020   Skin tear of right upper arm without complication 72/09/4707   Pressure injury of skin 10/23/2020   Urinary tract infection with hematuria 10/22/2020   Encephalopathy 10/22/2020   AKI (acute kidney injury) ([place])    Overweight (BMI 25.0-29.9) 08/19/2020   Vertebral fracture, osteoporotic ([place]) 08/18/2020   T8 vertebral fracture ([place]) 08/17/2020   Thrombocytopenia ([place]) 08/17/2020   CKD (chronic kidney disease) stage 3, GFR 30-59 ml/min (HCC) 08/17/2020   Elevated troponin 08/17/2020   Pain due to onychomycosis of toenails of both feet 07/27/2020   Callus of heel 07/27/2020   Bifascicular block 11/11/2019   Acute on chronic diastolic heart failure ([place]) 11/10/2019   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   Severe aortic stenosis 09/29/2019   Aortic valve disorder 62/83/6629   Systolic heart failure ([place]) 03/11/2019   Thrombophilia ([place]) 02/26/2019   Muscle pain 02/11/2019   Weakness 02/06/2019   Polymyalgia rheumatica ([place]) 02/06/2019   Shoulder joint pain 02/02/2019   Anemia 01/26/2019   Malignant melanoma of skin ([place]) 01/26/2019   Pain in right leg 01/26/2019   DDD (degenerative disc disease), cervical 05/28/2018   Encounter for general adult medical examination without abnormal findings 11/19/2017   Carotid artery occlusion 07/03/2017   Hearing loss 07/03/2017   Fall 06/19/2017   Injury of  upper extremity 06/19/2017   Neck pain 06/19/2017   Headache 06/19/2017   Visual disturbance 06/19/2017   Ganglion of hand 05/08/2017   Dilatation of aorta ([place]) 02/14/2017   Plantar fascial fibromatosis 02/14/2017   Cardiac murmur, unspecified 02/27/2016   Benign neoplasm of colon 08/24/2015   Dementia ([place]) 08/24/2015   Influenza due to unidentified influenza virus with other respiratory manifestations 03/15/2015   Epistaxis 12/01/2014   OSA (obstructive sleep apnea) 11/18/2014   Gastro-esophageal reflux disease with esophagitis 10/29/2014   Angina pectoris ([place]) 05/04/2014   Abnormal feces 07/31/2013   Other specified disorders of gingiva and edentulous alveolar ridge 07/31/2013   Spontaneous ecchymosis 10/10/2012   Thoracic ascending aortic aneurysm ([place]) 09/29/2012   Microscopic hematuria 04/11/2012   Proteinuria 04/11/2012   S/P laparoscopic cholecystectomy 04/09/2012   S/P CABG x 3 03/06/2012   S/P Maze operation for atrial fibrillation 03/06/2012   Paroxysmal atrial fibrillation ([place]) 02/29/2012   Bitten or stung by nonvenomous insect and other nonvenomous arthropods, initial encounter 12/03/2011   Depression screening 10/17/2011   CAD (coronary artery disease), native coronary artery    Hyperlipidemia  Hypertension    Chronic venous insufficiency    Gout    Benign prostatic hyperplasia    Post-traumatic stress disorder 03/13/2011   Pain in left leg 12/08/2010   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   Fatigue 08/08/2010   Retention of urine 04/18/2010   Constipation 07/05/2009   Kidney stone 07/05/2009   ED (erectile dysfunction) of organic origin 12/14/2008   Impaired fasting glucose 12/14/2008   Vitamin B deficiency 12/14/2008    ONSET DATE: 05/25/21   REFERRING DIAG: R48.546 (ICD-10-CM) - Left middle cerebral artery stroke   THERAPY DIAG:  Aphasia  Cognitive communication deficit  Rationale for Evaluation and Treatment Rehabilitation  SUBJECTIVE:    SUBJECTIVE STATEMENT: "I apologize for not bringing my homework." Pt accompanied by: self  PERTINENT HISTORY:  JUSTICE MILLIRON is a 88 y.o. male with medical history significant of AAA; CAD s/p CABG; chronic systolic CHF; afib s/p Maze, on Xarelto; HTN; HLD; PMR (currently on steroids); pre-diabetes; OSA on CPAP; PAD with lymphedema; and AS s/p TAVR presenting with stroke-like symptoms on 05/25/21. FAmily had a meeting with bad news.  Shortly thereafter, he was trembling on one side with slurred speech.  His episode lasted less than a minute but the speech difficulty persisted. He completed home health PT OT and speech and arrives at OP rehab for his continuation of rehabilitative care.  Inpatient rehab discharge note: "Patient has made excellent gains and has met 5 of 5 long-term goals this admission due to improved motor speech, word finding skills, complex problem solving, and attention. Pt is completing high level cognitive-linguistic tasks with mod I-to-sup A in regards to problem solving, attention, and functional recall. Pt demonstrates improved self-monitoring and self correction of verbal errors when communicating at the phrase/sentence level. Pt continues to exhibit mild higher level word finding, spelling, motor speech production, and alternating attention deficits. Patient and family education is complete and patient to discharge at overall mod I-to-sup A level. Patient's care partner is independent to provide the necessary physical and cognitive assistance at discharge. Pt would continue to benefit from skilled ST services in order to maximize functional independence and reduce burden of care, with continued skilled ST services at discharge."  PAIN:  Are you having pain? Yes: NPRS scale: 3/10 Pain location: lumbar spine Pain description: aching, sore, sharp Aggravating factors: standing/walking Relieving factors: recliner  PATIENT GOALS  Speak intelligently, write with better spelling  ability  OBJECTIVE:   TODAY'S TREATMENT:  09/25/21: "I was with friends yesterday - we went out for brunch - and I felt it more difficult to speak clearly. I really had to think about what I have to say." SLP and pt with long (10-minute) discussion on self-advocating in a conversation about his need for slower rate of speech in order to improve accuracy in verbal expression. Pt indicated understanding and agreed that self-advocating would be a good thing for him to do. With three-four sentence response stimuli (verbal) pt used slower rate and errors (approx 1 per sentence) were detected and remediated successfully. SLP provided pt homework for writing at word level, and speaking at sentence/2-sentence level.  Pt named 8 items/60 seconds in a category with independence.  09/18/21: Ezrael was able to communicate his intended message with success using slowed speech rate, circumlocution, and synonym strategies. He was aware of errors 100% of the time. SLP assisted pt with word level aphasia (written) with cues for error words and then min-mod cues for letter accuracy. Homework for written aphasia at the word/phrase  level was provided.  09/13/21: Assessed pt's writing skills for language ability today. Carry made aphasic errors in all sets of responses (divergent naming tasks). Pt aware of errors 75% of the time. With error words SLP provided "fill in the blank", and pt with correct response 80% of the time. SLP provided him practice with written language accuracy, with instructions to wife to assist if he has difficulty with the same means (fill in the blank).  08/29/21: Discussed evaluation results, probable goals, and discussed pt's need to write more and provide himself more opportunity for practice.   PATIENT EDUCATION: Education details: see above in "today's treatment" Person educated: Patient Education method: Explanation Education comprehension: verbalized understanding and needs further  education    PATIENT REPORTED OUTCOME MEASURES (PROM): Communication Participation Item Bank: to be completed next session and Communication Effectiveness Survey: to be completed next session    GOALS: Goals reviewed with patient? Yes  SHORT TERM GOALS: Target date: 09/26/2021    Pt will name average 8 items/60 seconds in simple category important to pt in 3 sessions Baseline:09-25-21 Goal status: Ongoing  2.  Pt will write to-do lists, errand lists (1-3 word responses) functionally in 3 sessions Baseline:  Goal status: Ongoing  3.  Pt will participate in 8 minutes functional mod complex conversation with modified independence in 2 sessions Baseline: 09-18-21 Goal status: Met    LONG TERM GOALS: Target date: 10/24/2021   Pt will write sentences of 5+ words, functionally, with rare min A in 3 sessions Baseline:  Goal status: Ongoing  2.   Pt will participate in 10 minutes functional mod complex-complex conversation with modified independence in 3 sessions Baseline:  Goal status: Ongoing   ASSESSMENT:  CLINICAL IMPRESSION: Patient is a 88 y.o. male who was seen today for speech and language therapy following CVA in May 2023. Pt's mod complex verbal expression was functional with consistent extra time. See TX NOTE.  OBJECTIVE IMPAIRMENTS include expressive language and aphasia. These impairments are limiting patient from ADLs/IADLs and effectively communicating at home and in community. Factors affecting potential to achieve goals and functional outcome are  none . Patient will benefit from skilled SLP services to address above impairments and improve overall function.  REHAB POTENTIAL: Good  PLAN: SLP FREQUENCY: 2x/week  SLP DURATION: 8 weeks  PLANNED INTERVENTIONS: Language facilitation, Environmental controls, Cueing hierachy, Internal/external aids, Oral motor exercises, Functional tasks, Multimodal communication approach, SLP instruction and feedback, Compensatory  strategies, and Patient/family education    [facility] [place] 09/25/2021, 2:51 PM

## 2021-09-25 NOTE — Therapy (Signed)
OUTPATIENT PHYSICAL THERAPY NEURO TREATMENT   Patient Name: [name] [name] MRN: [id number] DOB:[birth year], 88 y.o., male Today's Date: 09/25/2021   PCP: [name], MD REFERRING PROVIDER: Charlett Blake, MD    PT End of Session - 09/25/21 1612     Visit Number 5    Number of Visits 16    Date for PT Re-Evaluation 11/07/21    Authorization Type BCBS Medicare    Authorization Time Period no VL, no auth    PT Start Time 1532    PT Stop Time 1611    PT Time Calculation (min) 39 min    Equipment Utilized During Treatment Gait belt    Activity Tolerance Patient tolerated treatment well    Behavior During Therapy WFL for tasks assessed/performed                 Past Medical History:  Diagnosis Date   AAA (abdominal aortic aneurysm) ([place])    Arthritis    Ascending aorta dilatation ([place]) 03/13/2012   Fusiform dilatation of the ascending thoracic aorta discovered during surgery, max diameter 4.2-4.3 cm   BPH (benign prostatic hypertrophy)    Chronic systolic heart failure ([place]) 03/03/2012   Chronic venous insufficiency    GERD (gastroesophageal reflux disease)    Glaucoma    Gout    History of kidney stones    Hyperlipidemia    Hypertension    Obesity (BMI 30-39.9)    Paroxysmal atrial fibrillation ([place]) 02/29/2012   Recurrent paroxysmal, new-onset    PMR (polymyalgia rheumatica) (HCC)    Polymyalgia rheumatica ([place]) 02/06/2019   Pre-diabetes    S/P CABG x 3 03/06/2012   LIMA to LAD, SVG to OM, SVG to RCA, EVH via right thigh   S/P Maze operation for atrial fibrillation 03/06/2012   Complete bilateral lesions set using bipolar radiofrequency and cryothermy ablation with clipping of LA appendage   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   s/p TAVR with a 26 mm Edwards via the left subclavian by Drs Buena Irish and Bartle   Severe aortic stenosis 09/29/2019   Sleep apnea    USES CPAP NIGHTLY   Past Surgical History:  Procedure Laterality  Date   ABDOMINAL AORTOGRAM W/LOWER EXTREMITY N/A 10/26/2020   Procedure: ABDOMINAL AORTOGRAM W/LOWER EXTREMITY;  Surgeon: Broadus John, MD;  Location: [facility];  Service: Cardiovascular;  Laterality: N/A;   CARDIAC CATHETERIZATION     CATARACT EXTRACTION, BILATERAL     with lens implants   CHOLECYSTECTOMY N/A 03/24/2012   Procedure: LAPAROSCOPIC CHOLECYSTECTOMY WITH INTRAOPERATIVE CHOLANGIOGRAM;  Surgeon: Zenovia Jarred, MD;  Location: [facility];  Service: General;  Laterality: N/A;   CORONARY ARTERY BYPASS GRAFT N/A 03/06/2012   Procedure: CORONARY ARTERY BYPASS GRAFTING (CABG);  Surgeon: Rexene Alberts, MD;  Location: [facility];  Service: Open Heart Surgery;  Laterality: N/A;   ENDOVEIN HARVEST OF GREATER SAPHENOUS VEIN Right 03/06/2012   Procedure: ENDOVEIN HARVEST OF GREATER SAPHENOUS VEIN;  Surgeon: Rexene Alberts, MD;  Location: [facility];  Service: Open Heart Surgery;  Laterality: Right;   INTRAOPERATIVE TRANSESOPHAGEAL ECHOCARDIOGRAM N/A 03/06/2012   Procedure: INTRAOPERATIVE TRANSESOPHAGEAL ECHOCARDIOGRAM;  Surgeon: Rexene Alberts, MD;  Location: [facility];  Service: Open Heart Surgery;  Laterality: N/A;   IR KYPHO LUMBAR INC FX REDUCE BONE BX UNI/BIL CANNULATION INC/IMAGING  02/23/2020   LEFT HEART CATH Right 03/02/2012   Procedure: LEFT HEART CATH;  Surgeon: Sherren Mocha, MD;  Location: [facility];  Service:  Cardiovascular;  Laterality: Right;   MAZE N/A 03/06/2012   Procedure: MAZE;  Surgeon: Rexene Alberts, MD;  Location: [facility];  Service: Open Heart Surgery;  Laterality: N/A;   PERIPHERAL VASCULAR INTERVENTION Right 10/26/2020   Procedure: PERIPHERAL VASCULAR INTERVENTION;  Surgeon: Broadus John, MD;  Location: [facility];  Service: Cardiovascular;  Laterality: Right;   PROSTATECTOMY     partial   RIGHT/LEFT HEART CATH AND CORONARY/GRAFT ANGIOGRAPHY N/A 09/29/2019   Procedure: RIGHT/LEFT HEART CATH AND CORONARY/GRAFT ANGIOGRAPHY;  Surgeon: Adrian Prows, MD;  Location: [facility];  Service: Cardiovascular;  Laterality: N/A;   TEE WITHOUT CARDIOVERSION N/A 11/10/2019   Procedure: TRANSESOPHAGEAL ECHOCARDIOGRAM (TEE);  Surgeon: Burnell Blanks, MD;  Location: [facility];  Service: Open Heart Surgery;  Laterality: N/A;   TRANSCATHETER AORTIC VALVE REPLACEMENT, TRANSFEMORAL  11/10/2019   Patient Active Problem List   Diagnosis Date Noted   Left middle cerebral artery stroke ([place]) 05/31/2021   Acute CVA (cerebrovascular accident) ([place]) 05/25/2021   DNR (do not resuscitate) 05/25/2021   Osteoporosis 05/05/2021   Gouty arthritis 02/13/2021   Morbid obesity ([place]) 02/13/2021   Abdominal aortic aneurysm without rupture ([place]) 11/22/2020   Acute renal failure syndrome ([place]) 11/22/2020   Low back pain 11/22/2020   Skin tear of right upper arm without complication 32/67/1245   Pressure injury of skin 10/23/2020   Urinary tract infection with hematuria 10/22/2020   Encephalopathy 10/22/2020   AKI (acute kidney injury) ([place])    Overweight (BMI 25.0-29.9) 08/19/2020   Vertebral fracture, osteoporotic ([place]) 08/18/2020   T8 vertebral fracture ([place]) 08/17/2020   Thrombocytopenia ([place]) 08/17/2020   CKD (chronic kidney disease) stage 3, GFR 30-59 ml/min (HCC) 08/17/2020   Elevated troponin 08/17/2020   Pain due to onychomycosis of toenails of both feet 07/27/2020   Callus of heel 07/27/2020   Bifascicular block 11/11/2019   Acute on chronic diastolic heart failure ([place]) 11/10/2019   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   Severe aortic stenosis 09/29/2019   Aortic valve disorder 80/99/8338   Systolic heart failure ([place]) 03/11/2019   Thrombophilia ([place]) 02/26/2019   Muscle pain 02/11/2019   Weakness 02/06/2019   Polymyalgia rheumatica ([place]) 02/06/2019   Shoulder joint pain 02/02/2019   Anemia 01/26/2019   Malignant melanoma of skin ([place]) 01/26/2019   Pain in right leg 01/26/2019   DDD (degenerative disc disease), cervical  05/28/2018   Encounter for general adult medical examination without abnormal findings 11/19/2017   Carotid artery occlusion 07/03/2017   Hearing loss 07/03/2017   Fall 06/19/2017   Injury of upper extremity 06/19/2017   Neck pain 06/19/2017   Headache 06/19/2017   Visual disturbance 06/19/2017   Ganglion of hand 05/08/2017   Dilatation of aorta ([place]) 02/14/2017   Plantar fascial fibromatosis 02/14/2017   Cardiac murmur, unspecified 02/27/2016   Benign neoplasm of colon 08/24/2015   Dementia ([place]) 08/24/2015   Influenza due to unidentified influenza virus with other respiratory manifestations 03/15/2015   Epistaxis 12/01/2014   OSA (obstructive sleep apnea) 11/18/2014   Gastro-esophageal reflux disease with esophagitis 10/29/2014   Angina pectoris ([place]) 05/04/2014   Abnormal feces 07/31/2013   Other specified disorders of gingiva and edentulous alveolar ridge 07/31/2013   Spontaneous ecchymosis 10/10/2012   Thoracic ascending aortic aneurysm ([place]) 09/29/2012   Microscopic hematuria 04/11/2012   Proteinuria 04/11/2012   S/P laparoscopic cholecystectomy 04/09/2012   S/P CABG x 3 03/06/2012   S/P Maze operation for atrial fibrillation 03/06/2012  Paroxysmal atrial fibrillation ([place]) 02/29/2012   Bitten or stung by nonvenomous insect and other nonvenomous arthropods, initial encounter 12/03/2011   Depression screening 10/17/2011   CAD (coronary artery disease), native coronary artery    Hyperlipidemia    Hypertension    Chronic venous insufficiency    Gout    Benign prostatic hyperplasia    Post-traumatic stress disorder 03/13/2011   Pain in left leg 12/08/2010   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   Fatigue 08/08/2010   Retention of urine 04/18/2010   Constipation 07/05/2009   Kidney stone 07/05/2009   ED (erectile dysfunction) of organic origin 12/14/2008   Impaired fasting glucose 12/14/2008   Vitamin B deficiency 12/14/2008    ONSET DATE: 05/31/21  REFERRING DIAG:  W54.627 (ICD-10-CM) - Left middle cerebral artery stroke (HCC)   THERAPY DIAG:  Muscle weakness (generalized)  Other abnormalities of gait and mobility  Unsteadiness on feet  Rationale for Evaluation and Treatment Rehabilitation  SUBJECTIVE:                                                                                                                                                                                              SUBJECTIVE STATEMENT: Had some confusion with his computer but got it resolved.   Pt accompanied by: self  PERTINENT HISTORY: medical history significant of AAA; CAD s/p CABG; chronic systolic CHF; afib s/p Maze, on Xarelto; HTN; HLD; PMR (currently on steroids); pre-diabetes; OSA on CPAP; PAD with lymphedema; and AS s/p TAVR   PAIN:  Are you having pain? Yes: NPRS scale: 3/10 Pain location: lumbar spine Pain description: aching, sore, sharp Aggravating factors: standing/walking Relieving factors: recliner, hot pack, stretching  PRECAUTIONS: Fall, pressure sores bilateral calcaneus right > left  PATIENT GOALS be able to stand unsupported for a time to perform ADL, walking if possible  OBJECTIVE:     TODAY'S TREATMENT: 09/25/21 Activity Comments  Squat pivot w/c>mat Min A  Sit>supine with strap on L LE Min A  bridge Unable  Hooklying glute squeeze 10x5" C/o pain in LB- advised to reduce intensity by 50%  Supine clam with core contraction 10x   Supine>sit Supervision   Squat pivot w/c>mat Supervision; poor set up and safety awareness   IFC on B LB 10 min, with MHP on LB 10 min 80-150 hz, intensity to tolerance; patient reported resolution of pain radiating into Les     PATIENT EDUCATION: Education details: edu on benefit of TENS, safety with TENS, and recommended device Person educated: Patient Education method: Explanation, Demonstration, Tactile cues, Verbal cues, and Handouts Education comprehension: verbalized understanding and returned  demonstration  HOME EXERCISE PROGRAM Last updated: 09/18/21 Access Code: [email or web address] URL: [email or web address] Date: 09/18/2021 Prepared by: [facility]  Exercises - Seated Heel Raise  - 1 x daily - 5 x weekly - 2 sets - 10 reps - Seated Toe Raise  - 1 x daily - 5 x weekly - 2 sets - 10 reps - Seated March  - 1 x daily - 5 x weekly - 2 sets - 10 reps - Seated Long Arc Quad  - 1 x daily - 5 x weekly - 2 sets - 10 reps - Seated Hip Adduction Squeeze with Ball  - 1 x daily - 5 x weekly - 2 sets - 10 reps - Row with Anchored Resistance on Swiss Ball  - 1 x daily - 5 x weekly - 2 sets - 10 reps - Seated Shoulder Extension and Scapular Retraction with Resistance on Swiss Ball  - 1 x daily - 5 x weekly - 2 sets - 10 reps    Below measures were taken at time of initial evaluation unless otherwise specified:   DIAGNOSTIC FINDINGS: IMPRESSION: Small areas of acute infarction in the right occipital lobe and left frontal lobe over the convexity. Mild to moderate acute infarct left operculum. Possible cerebral emboli.   Atrophy and mild chronic microvascular ischemia  COGNITION: Overall cognitive status: Within functional limits for tasks assessed Patient's historical recall is not very accurate, he reports his back injury presented x 9 months ago, whereas medical record notes encounter in 12/2019-01/2020 regarding L1 compression fx   SENSATION: Not tested  COORDINATION: Alt. Foot taps impaired due to dorsiflexion weakness  EDEMA:  Bilateral LE, wearing compression stockings. Uses lymphedema pump  MUSCLE TONE: DNT    POSTURE: rounded shoulders  LOWER EXTREMITY ROM:     Passive  Right Eval Left Eval  Hip flexion    Hip extension    Hip abduction    Hip adduction    Hip internal rotation    Hip external rotation    Knee flexion    Knee extension    Ankle dorsiflexion -10 -10  Ankle plantarflexion    Ankle inversion     Ankle eversion     (Blank rows = not tested)  LOWER EXTREMITY MMT:    MMT Right Eval Left Eval  Hip flexion 3 3  Hip extension    Hip abduction 2+ 2+  Hip adduction 3 3  Hip internal rotation    Hip external rotation    Knee flexion 3+ 3+  Knee extension 4- 4-  Ankle dorsiflexion 0 0  Ankle plantarflexion    Ankle inversion    Ankle eversion    (Blank rows = not tested)  BED MOBILITY:  Sit to supine Min A and Mod A Supine to sit Min A Pt notes his bed at home has assist rails  TRANSFERS: Assistive device utilized: Wheelchair (manual)  Sit to stand: CGA and Min A Stand to sit: CGA and Min A Chair to chair: CGA and Min A Floor:  DNT   W/C mobility Independent level surfaces  STAIRS:  DNT--pt has elevator to enter/exit home  GAIT: DNT due to safety concerns  FUNCTIONAL TESTs:  5 times sit to stand: unable, requires BUE and assist Static standing: dependent on BUE via RW (supported standing) Straight Leg Raise: 20+ degrees extensor lag  PATIENT SURVEYS:  FOTO to be assessed     PATIENT EDUCATION: Education details: assessment findings Person educated: Patient  Education method: Explanation Education comprehension: verbalized understanding   HOME EXERCISE PROGRAM: To be initiated, demonstration/rehearsal of hip add isometric    GOALS: Goals reviewed with patient? Yes  SHORT TERM GOALS: Target date: 09/26/2021  Patient will be independent in HEP to improve functional outcomes Baseline: Goal status: IN PROGRESS  2.  Patient to demonstrate sit to supine w/ supervision using adaptive equipment to reduce level of assist from caregiver Baseline: Min-mod A for BLE to clear EOB Goal status: IN PROGRESS    LONG TERM GOALS: Target date: 10/24/2021  Demonstrate improved hip abduction strength to 3+/5 to improve standing stability Baseline: 2+/5 Goal status: IN PROGRESS  2.  Manifest improved BLE strength as evidenced by ability to perform 5xSTS  test in 30 sec using BUE Baseline: unable Goal status: IN PROGRESS  3.  Demonstrate static standing balance of Fair (no UE support) x 15 sec to improve safety with ADL performance Baseline: unable, dependent on BUE (Fair-) Goal status: IN PROGRESS  4.  Demonstrate transfers w/ set-up assist to increase independence and safety with mobility Baseline: CGA-min A Goal status: IN PROGRESS   ASSESSMENT:  CLINICAL IMPRESSION: Patient arrived to session with report of no new concerns. Worked on transfers and bed mobility with cueing and occasional assist for max safety. Patient used strap as replacement for leg lifter to assist patient in being more independent with sit>supine transfers. Patient performed a few strengthening activities in supine but limited largely by pain. Remainder of session focused on trial of e-stim to address pain and educating patient on benefits of TENS for home use. Patient reported resolution of LE pain with e-stim. Patient reported understanding of all edu provided and without complaints at end of session.     OBJECTIVE IMPAIRMENTS cardiopulmonary status limiting activity, decreased activity tolerance, decreased balance, decreased knowledge of use of DME, decreased mobility, difficulty walking, decreased ROM, decreased strength, impaired perceived functional ability, and pain.   ACTIVITY LIMITATIONS lifting, bending, standing, squatting, transfers, bed mobility, toileting, dressing, and locomotion level  PARTICIPATION LIMITATIONS: meal prep, cleaning, community activity, yard work, and home mgmt  PERSONAL FACTORS Age, Time since onset of injury/illness/exacerbation, and 3+ comorbidities: see PMH  are also affecting patient's functional outcome.   REHAB POTENTIAL: Good  CLINICAL DECISION MAKING: Evolving/moderate complexity  EVALUATION COMPLEXITY: Moderate  PLAN: PT FREQUENCY: 2x/week  PT DURATION: 8 weeks  PLANNED INTERVENTIONS: Therapeutic exercises,  Therapeutic activity, Neuromuscular re-education, Balance training, Gait training, Patient/Family education, Self Care, Joint mobilization, Stair training, Vestibular training, Canalith repositioning, Orthotic/Fit training, DME instructions, Aquatic Therapy, Dry Needling, Electrical stimulation, Wheelchair mobility training, Spinal mobilization, Cryotherapy, Moist heat, Taping, Ionotophoresis '4mg'$ /ml Dexamethasone, and Manual therapy  PLAN FOR NEXT SESSION: try IFC to LB while performing standing activities to reduce pain; modalities to address LBP; nustep, LE strengthening, transfers   Janene Harvey, PT, DPT 09/25/21 4:15 PM  [place] [facility] [address] [place], [place] [address] Phone # [phone] Fax # [phone]

## 2021-09-25 NOTE — Patient Instructions (Signed)
TENS stands for Transcutaneous Electrical Nerve Stimulation. In other words, electrical impulses are allowed to pass through the skin in order to excite a nerve.   Purpose and Use of TENS:  TENS is a method used to manage acute and chronic pain without the use of drugs. It has been effective in managing pain associated with surgery, sprains, strains, trauma, rheumatoid arthritis, and neuralgias. It is a non-addictive, low risk, and non-invasive technique used to control pain. It is not, by any means, a curative form of treatment.   How TENS Works:  Most TENS units are a Paramedic unit powered by one 9 volt battery. Attached to the outside of the unit are two lead wires where two pins and/or snaps connect on each wire. All units come with a set of four reusable pads or electrodes. These are placed on the skin surrounding the area involved. By inserting the leads into  the pads, the electricity can pass from the unit making the circuit complete.  As the intensity is turned up slowly, the electrical current enters the body from the electrodes through the skin to the surrounding nerve fibers. This triggers the release of hormones from within the body. These hormones contain pain relievers. By increasing the circulation of these hormones, the person's pain may be lessened. It is also believed that the electrical stimulation itself helps to block the pain messages being sent to the brain, thus also decreasing the body's perception of pain.   Hazards:  TENS units are NOT to be used by patients with PACEMAKERS, DEFIBRILLATORS, DIABETIC PUMPS, PREGNANT WOMEN, and patients with SEIZURE DISORDERS.  TENS units are NOT to be used over the heart, throat, brain, or spinal cord.  One of the major side effects from the TENS unit may be skin irritation. Some people may develop a rash if they are sensitive to the materials used in the electrodes or the connecting wires.   Wear the unit for 10-15 min.   Avoid  overuse due the body getting used to the stem making it not as effective over time.   TENS UNIT  This is helpful for muscle pain and spasm.   Search and Purchase a TENS 7000 2nd edition at www.tenspros.com or www.amazon.com  (It should be less than $30)     TENS unit instructions:  Do not shower or bathe with the unit on Turn the unit off before removing electrodes or batteries If the electrodes lose stickiness add a drop of water to the electrodes after they are disconnected from the unit and place on plastic sheet. If you continued to have difficulty, call the TENS unit company to purchase more electrodes. Do not apply lotion on the skin area prior to use. Make sure the skin is clean and dry as this will help prolong the life of the electrodes. After use, always check skin for unusual red areas, rash or other skin difficulties. If there are any skin problems, does not apply electrodes to the same area. Never remove the electrodes from the unit by pulling the wires. Do not use the TENS unit or electrodes other than as directed. Do not change electrode placement without consulting your therapist or physician. Keep 2 fingers with between each electrode.

## 2021-09-26 ENCOUNTER — Telehealth: Payer: Self-pay | Admitting: Oncology

## 2021-09-26 NOTE — Telephone Encounter (Signed)
Scheduled per 09/12 los, patient has been called and notified of upcoming appointments.  

## 2021-09-27 ENCOUNTER — Ambulatory Visit: Payer: Medicare Other

## 2021-09-27 ENCOUNTER — Ambulatory Visit: Payer: Medicare Other | Admitting: Physical Therapy

## 2021-09-27 ENCOUNTER — Ambulatory Visit: Payer: Medicare Other | Admitting: Occupational Therapy

## 2021-09-27 ENCOUNTER — Encounter: Payer: Self-pay | Admitting: Physical Therapy

## 2021-09-27 DIAGNOSIS — I69351 Hemiplegia and hemiparesis following cerebral infarction affecting right dominant side: Secondary | ICD-10-CM | POA: Diagnosis not present

## 2021-09-27 DIAGNOSIS — R4701 Aphasia: Secondary | ICD-10-CM | POA: Diagnosis not present

## 2021-09-27 DIAGNOSIS — R262 Difficulty in walking, not elsewhere classified: Secondary | ICD-10-CM | POA: Diagnosis not present

## 2021-09-27 DIAGNOSIS — R41840 Attention and concentration deficit: Secondary | ICD-10-CM

## 2021-09-27 DIAGNOSIS — R2681 Unsteadiness on feet: Secondary | ICD-10-CM

## 2021-09-27 DIAGNOSIS — R2689 Other abnormalities of gait and mobility: Secondary | ICD-10-CM

## 2021-09-27 DIAGNOSIS — M6281 Muscle weakness (generalized): Secondary | ICD-10-CM

## 2021-09-27 DIAGNOSIS — R208 Other disturbances of skin sensation: Secondary | ICD-10-CM | POA: Diagnosis not present

## 2021-09-27 DIAGNOSIS — R41841 Cognitive communication deficit: Secondary | ICD-10-CM | POA: Diagnosis not present

## 2021-09-27 NOTE — Therapy (Signed)
OUTPATIENT OCCUPATIONAL THERAPY  TREATMENT NOTE  Patient Name: [name] [name] MRN: [id number] DOB:17-Oct-1926, 88 y.o., male Today's Date: 09/27/2021  PCP: [name], MD REFERRING PROVIDER: Charlett Blake, MD    OT End of Session - 09/27/21 1609     Visit Number 7    Number of Visits 17    Date for OT Re-Evaluation 10/27/21    Authorization Type BCBS Medicare    OT Start Time 2500    OT Stop Time 1618    OT Time Calculation (min) 40 min    Activity Tolerance Patient tolerated treatment well    Behavior During Therapy WFL for tasks assessed/performed                Past Medical History:  Diagnosis Date   AAA (abdominal aortic aneurysm) ([place])    Arthritis    Ascending aorta dilatation ([place]) 03/13/2012   Fusiform dilatation of the ascending thoracic aorta discovered during surgery, max diameter 4.2-4.3 cm   BPH (benign prostatic hypertrophy)    Chronic systolic heart failure ([place]) 03/03/2012   Chronic venous insufficiency    GERD (gastroesophageal reflux disease)    Glaucoma    Gout    History of kidney stones    Hyperlipidemia    Hypertension    Obesity (BMI 30-39.9)    Paroxysmal atrial fibrillation ([place]) 02/29/2012   Recurrent paroxysmal, new-onset    PMR (polymyalgia rheumatica) (HCC)    Polymyalgia rheumatica ([place]) 02/06/2019   Pre-diabetes    S/P CABG x 3 03/06/2012   LIMA to LAD, SVG to OM, SVG to RCA, EVH via right thigh   S/P Maze operation for atrial fibrillation 03/06/2012   Complete bilateral lesions set using bipolar radiofrequency and cryothermy ablation with clipping of LA appendage   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   s/p TAVR with a 26 mm Edwards via the left subclavian by Drs Buena Irish and Bartle   Severe aortic stenosis 09/29/2019   Sleep apnea    USES CPAP NIGHTLY   Past Surgical History:  Procedure Laterality Date   ABDOMINAL AORTOGRAM W/LOWER EXTREMITY N/A 10/26/2020   Procedure: ABDOMINAL AORTOGRAM  W/LOWER EXTREMITY;  Surgeon: Broadus John, MD;  Location: [facility];  Service: Cardiovascular;  Laterality: N/A;   CARDIAC CATHETERIZATION     CATARACT EXTRACTION, BILATERAL     with lens implants   CHOLECYSTECTOMY N/A 03/24/2012   Procedure: LAPAROSCOPIC CHOLECYSTECTOMY WITH INTRAOPERATIVE CHOLANGIOGRAM;  Surgeon: Zenovia Jarred, MD;  Location: [facility];  Service: General;  Laterality: N/A;   CORONARY ARTERY BYPASS GRAFT N/A 03/06/2012   Procedure: CORONARY ARTERY BYPASS GRAFTING (CABG);  Surgeon: Rexene Alberts, MD;  Location: [place];  Service: Open Heart Surgery;  Laterality: N/A;   ENDOVEIN HARVEST OF GREATER SAPHENOUS VEIN Right 03/06/2012   Procedure: ENDOVEIN HARVEST OF GREATER SAPHENOUS VEIN;  Surgeon: Rexene Alberts, MD;  Location: [facility];  Service: Open Heart Surgery;  Laterality: Right;   INTRAOPERATIVE TRANSESOPHAGEAL ECHOCARDIOGRAM N/A 03/06/2012   Procedure: INTRAOPERATIVE TRANSESOPHAGEAL ECHOCARDIOGRAM;  Surgeon: Rexene Alberts, MD;  Location: [place];  Service: Open Heart Surgery;  Laterality: N/A;   IR KYPHO LUMBAR INC FX REDUCE BONE BX UNI/BIL CANNULATION INC/IMAGING  02/23/2020   LEFT HEART CATH Right 03/02/2012   Procedure: LEFT HEART CATH;  Surgeon: Sherren Mocha, MD;  Location: [facility];  Service: Cardiovascular;  Laterality: Right;   MAZE N/A 03/06/2012   Procedure: MAZE;  Surgeon: Rexene Alberts, MD;  Location:  [facility];  Service: Open Heart Surgery;  Laterality: N/A;   PERIPHERAL VASCULAR INTERVENTION Right 10/26/2020   Procedure: PERIPHERAL VASCULAR INTERVENTION;  Surgeon: Broadus John, MD;  Location: [facility];  Service: Cardiovascular;  Laterality: Right;   PROSTATECTOMY     partial   RIGHT/LEFT HEART CATH AND CORONARY/GRAFT ANGIOGRAPHY N/A 09/29/2019   Procedure: RIGHT/LEFT HEART CATH AND CORONARY/GRAFT ANGIOGRAPHY;  Surgeon: Adrian Prows, MD;  Location: [place] [facility];  Service: Cardiovascular;  Laterality: N/A;   TEE WITHOUT CARDIOVERSION N/A  11/10/2019   Procedure: TRANSESOPHAGEAL ECHOCARDIOGRAM (TEE);  Surgeon: Burnell Blanks, MD;  Location: [place];  Service: Open Heart Surgery;  Laterality: N/A;   TRANSCATHETER AORTIC VALVE REPLACEMENT, TRANSFEMORAL  11/10/2019   Patient Active Problem List   Diagnosis Date Noted   Left middle cerebral artery stroke ([place]) 05/31/2021   Acute CVA (cerebrovascular accident) ([place]) 05/25/2021   DNR (do not resuscitate) 05/25/2021   Osteoporosis 05/05/2021   Gouty arthritis 02/13/2021   Morbid obesity ([place]) 02/13/2021   Abdominal aortic aneurysm without rupture ([place]) 11/22/2020   Acute renal failure syndrome ([place]) 11/22/2020   Low back pain 11/22/2020   Skin tear of right upper arm without complication 38/88/2800   Pressure injury of skin 10/23/2020   Urinary tract infection with hematuria 10/22/2020   Encephalopathy 10/22/2020   AKI (acute kidney injury) ([place])    Overweight (BMI 25.0-29.9) 08/19/2020   Vertebral fracture, osteoporotic ([place]) 08/18/2020   T8 vertebral fracture ([place]) 08/17/2020   Thrombocytopenia ([place]) 08/17/2020   CKD (chronic kidney disease) stage 3, GFR 30-59 ml/min (HCC) 08/17/2020   Elevated troponin 08/17/2020   Pain due to onychomycosis of toenails of both feet 07/27/2020   Callus of heel 07/27/2020   Bifascicular block 11/11/2019   Acute on chronic diastolic heart failure ([place]) 11/10/2019   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   Severe aortic stenosis 09/29/2019   Aortic valve disorder 34/91/7915   Systolic heart failure ([place]) 03/11/2019   Thrombophilia ([place]) 02/26/2019   Muscle pain 02/11/2019   Weakness 02/06/2019   Polymyalgia rheumatica ([place]) 02/06/2019   Shoulder joint pain 02/02/2019   Anemia 01/26/2019   Malignant melanoma of skin ([place]) 01/26/2019   Pain in right leg 01/26/2019   DDD (degenerative disc disease), cervical 05/28/2018   Encounter for general adult medical examination without abnormal findings 11/19/2017    Carotid artery occlusion 07/03/2017   Hearing loss 07/03/2017   Fall 06/19/2017   Injury of upper extremity 06/19/2017   Neck pain 06/19/2017   Headache 06/19/2017   Visual disturbance 06/19/2017   Ganglion of hand 05/08/2017   Dilatation of aorta ([place]) 02/14/2017   Plantar fascial fibromatosis 02/14/2017   Cardiac murmur, unspecified 02/27/2016   Benign neoplasm of colon 08/24/2015   Dementia ([place]) 08/24/2015   Influenza due to unidentified influenza virus with other respiratory manifestations 03/15/2015   Epistaxis 12/01/2014   OSA (obstructive sleep apnea) 11/18/2014   Gastro-esophageal reflux disease with esophagitis 10/29/2014   Angina pectoris ([place]) 05/04/2014   Abnormal feces 07/31/2013   Other specified disorders of gingiva and edentulous alveolar ridge 07/31/2013   Spontaneous ecchymosis 10/10/2012   Thoracic ascending aortic aneurysm ([place]) 09/29/2012   Microscopic hematuria 04/11/2012   Proteinuria 04/11/2012   S/P laparoscopic cholecystectomy 04/09/2012   S/P CABG x 3 03/06/2012   S/P Maze operation for atrial fibrillation 03/06/2012   Paroxysmal atrial fibrillation ([place]) 02/29/2012   Bitten or stung by nonvenomous insect and other nonvenomous arthropods, initial encounter  12/03/2011   Depression screening 10/17/2011   CAD (coronary artery disease), native coronary artery    Hyperlipidemia    Hypertension    Chronic venous insufficiency    Gout    Benign prostatic hyperplasia    Post-traumatic stress disorder 03/13/2011   Pain in left leg 12/08/2010   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   Fatigue 08/08/2010   Retention of urine 04/18/2010   Constipation 07/05/2009   Kidney stone 07/05/2009   ED (erectile dysfunction) of organic origin 12/14/2008   Impaired fasting glucose 12/14/2008   Vitamin B deficiency 12/14/2008    ONSET DATE: 05/25/2021  REFERRING DIAG: Y10.175 (ICD-10-CM) - Left middle cerebral artery stroke   THERAPY DIAG:  Hemiplegia and  hemiparesis following cerebral infarction affecting right dominant side (HCC)  Attention and concentration deficit  Other disturbances of skin sensation  Muscle weakness (generalized)  Rationale for Evaluation and Treatment Rehabilitation  SUBJECTIVE:   SUBJECTIVE STATEMENT: Pt reports continued challenges with typing and utilizing computer mouse. Pt accompanied by: self and significant other (dropped off pt)  PERTINENT HISTORY:  H/o AAA, CAD with CABG, compression fractures, chronic anemia followed by hematology services, chronic systolic congestive heart failure, A-fib/AF status post MAZE on Xarelto, hypertension, hyperlipidemia, PMR maintained on steroids, prediabetes, early dementia maintained on Namenda, chronic lower extremity wounds with Unna boot OSA with CPAP, PAD with lymphedema status post balloon angioplasty of posterior tibial artery October 2022 recent UTI placed on antibiotic therapy.  He does have a personal care attendant Monday Wednesday Friday for 3 hours.  Modified independent transfers power wheelchair bound x3 to 4 months prior to that ambulating short distances with a rollator.  Present 05/25/2021 with right-sided weakness and aphasia of acute onset.  Cranial CT scan showed small left MCA territory cortical infarct within the mid to posterior left frontal lobe.  CT angiogram head and neck no large vessel occlusion.   PRECAUTIONS: Fall  PAIN:  Are you having pain? Yes: NPRS scale: 4-5/10 Pain location: mid back Pain description: constant; dull, achy Aggravating factors: standing, certain movements/exercises Relieving factors: injections, flat on back  FALLS: Has patient fallen in last 6 months? Yes. Number of falls 4, has not fallen recently, per pt report  LIVING ENVIRONMENT: Lives with: lives with their spouse Lives in: House/apartment Stairs:  Yes: steps on front and back of house, however has an elevator at the back, steps to 2nd floor but does not need to go  upstairs Has following equipment at home: Environmental consultant - 2 wheeled, Environmental consultant - 4 wheeled, Wheelchair (power), Wheelchair (manual), shower chair, bed side commode, Grab bars, and elevator to access home from outside, hand held shower head  PLOF:  Has a caregiver 3x/week that assists with ADLs, d/t spinal compression fx pt has become more w/c bound in 5-6 months due to    [place] to talk properly; to write and to spell  OBJECTIVE:   HAND DOMINANCE: Right  ADLs: Dressing: Setup w/ UB dressing, wife assists w/ pants, socks, shoes Grooming: Mod I from seated position Eating: reports great difficulty using a knife to cut food, wife frequently cuts foods for him Toileting: Occ difficulty pulling pants back up after toileting, leans forward to complete hygiene Bathing: Supervision/setup  Tub Shower transfers: caregiver assist w/ transfers in/out of shower providing SPV and occ Min A Equipment: Shower seat with back, Grab bars, Walk in shower, bed side commode, Reacher, and Sock aid  IADLs: Financial management: Reports difficulty with writing a check, math, spelling, but did  most of financial management Handwriting: 50% legible and difficulty with spelling that has gotten worse since CVA  MOBILITY STATUS: Needs Assist: Mod I stand pivot from w/c, Supervision for shower transfers  FUNCTIONAL OUTCOME MEASURES: FOTO: 58 on 09/27/21  UPPER EXTREMITY MMT:     MMT Right eval Left eval  Shoulder flexion 4+/5 4+/5  Shoulder abduction    Shoulder adduction    Shoulder extension    Shoulder internal rotation    Shoulder external rotation    Middle trapezius    Lower trapezius    Elbow flexion 4+/5 4+/5  Elbow extension 4+/5 4+/5  Wrist flexion    Wrist extension    Wrist ulnar deviation    Wrist radial deviation    Wrist pronation    Wrist supination    (Blank rows = not tested)  HAND FUNCTION: Grip strength: Right: 30 lbs; Left: 30 lbs  COORDINATION: 9 Hole Peg test: Right:  1:06.53 sec; Left: 34.53 sec 09/20/21: Right: 56.15 sec  SENSATION: Reports numbness and difficulty maintaining grasp on spoon/fork and writing utensil  -------------------------------------------------------------------------------------------------------------------------------------------------------- (objective measures above completed at initial evaluation unless otherwise dated)  TODAY'S TREATMENT: 09/27/21 ADL: reports requiring assistance for getting pants on/off but that he feels that he could do it but it requires increased time and effort.  Getting on/off toilet can be challenging.  Pt reports increased difficulty with getting pants and briefs up post toileting.   Performed FOTO.  Pt continues to report impairments with handwriting and typing and inconsistent impairments impacting functional tasks.  Coordination: picking up variety of small items with increased time and effort.  Pt initially attempting to slide off edge of table.  Pt with increased difficulty with smaller beads and beans compared to stones and coins.  Progressed to in-hand manipulation and translation with pt dropping 1/5 stones when translating from finger tips to palm and 2/5 stones when attempting to translate from palm to finger tips.  Increased challenge to completing in-hand manipulation and translation with coins to then place into coin slot.  Pt demonstrating improved coordination and motor control with coins compared to stones.  Pt verbalizes needing to visually attend to task to "move the coin".  Rotating pen in finger tips and walking fingers up/down pen.  Discussed structured and functional [place] and in-hand manipulation tasks to continue to address coordination at home.  Provided with printed handouts.    09/25/21 Pipe tree puzzle: replication of pattern with focus on BUE use, sequencing, organization, and problem solving. Pt required initial pattern and demonstration of coding to correctly identify each piece.   Pt able to complete with mild increase in time, however making no errors. Categorizing: provided with list of items and directed to place in categories according to where he would purchase items.  Task facilitating handwriting, thinking skills of organization and sequencing. Pt with letter reversals such as "b, d, and p".  OT then challenged pt to estimate how much money he would spend at each store.  Pt able to complete systematically and able to report rationale for prices as needed.    09/20/21 Handwriting: engaged in pre-writing activities with dot to dot and maze puzzles with focus on coordination and manipulation of pen during pre-writing strokes to facilitate carryover to writing tasks.  Discussed and showed pt pictures to purchase of built up handles/"pencil grips" to allow for improved grip on writing utensils as hand "slips" when holding skinny pen/pencil. Pt demonstrating mod improvements with larger circumference pens. Alternating attention: Engaged in  small peg board pattern replication with use of key to identify correlating colors, challenging alternating attention, visual perception, and R hand coordination and dexterity. Pt initially demonstrating decreased organization with visual scanning, OT providing cues and demonstration with pt then demonstrating improved organization and sequencing. Pt demonstrating mild-mod difficulty with manipulation of pegs in-hand to place in peg board, largely due to small size of pegs. Educated on functional carryover of task implications with systematic process to minimize onset of errors. Completed 9 hole peg test at end of session with improvements noted (see above for measurements).   PATIENT EDUCATION: Ongoing condition-specific education related to therapeutic interventions completed this session Person educated: Patient Education method: Explanation, Demonstration, and Handouts Education comprehension: verbalized understanding   HOME EXERCISE  PROGRAM: Access Code: [email or web address] URL: [email or web address] Date: 09/11/2021 Prepared by: [facility]  Exercises - Putty Squeezes  - 1 x daily - 1 sets - 10 reps - Rolling Putty on Table  - 1 x daily - 1 sets - 10 reps - Thumb Opposition with Putty  - 1 x daily - 1 sets - 10 reps - Key Pinch with Putty  - 1 x daily - 1 sets - 10 reps - Tip Pinch with Putty  - 1 x daily - 1 sets - 10 reps - Removing Marbles from Putty  - 1 x daily - 1 sets - 10 reps  GOALS: Goals reviewed with patient? Yes  SHORT TERM GOALS: Target date: 09/29/2021  Pt will verbalize understanding of adapted strategies and/or potential AE needs to increase ease, safety, and independence w/ LB dressing. Baseline: Goal status: NOT MET - 09/27/21 Pt reports still having difficulty with getting pants up/down and on/off.  Will benefit from simulated practice but have not to this point due to focus on [facility] and handwriting  2.  Pt will be independent with FMC/GMC and strengthening HEP to increase independence with handwriting and cutting of foods. Baseline:  Goal status: MET - 09/27/21  3. Pt will demonstrate improved fine motor coordination for ADLs as evidenced by decreasing 9 hole peg test score for RUE by 5 secs  Baseline: 9 Hole Peg test: Right: 1:06.53 sec; Left: 34.53 sec  Goal status: MET - 56.15 sec   LONG TERM GOALS: Target date: 10/27/2021  Pt will report ability to complete LB dressing with supervision for standing balance and use of AE as needed. Baseline:  Goal status: IN PROGRESS  2.  Pt will demonstrated improved standing balance to stand for 1 min with alternating UE to allow for increased independence with LB dressing/clothing management post toileting. Baseline:  Goal status: IN PROGRESS  3.  Pt will demonstrated improved working memory and recall during check writing/balancing check book by making 0 errors. Baseline:  Goal status: IN PROGRESS  4.   Pt will demonstrate improved fine motor coordination for ADLs as evidenced by decreasing 9 hole peg test score for RUE by 20 secs Baseline: 9 Hole Peg test: Right: 1:06.53 sec; Left: 34.53 sec Goal status: REVISED - upgraded 09/20/21 due to completing in 56.15 sec   5.  Pt will write a short paragraph with 90% legibility and no significant errors in spelling as evidenced by </= to 3 spelling errors. Baseline:  Goal status: IN PROGRESS   ASSESSMENT:  CLINICAL IMPRESSION: Treatment session with focus on [facility] tasks to continue to address decreased coordination and motor control with functional tasks, particularly typing and using mouse on computer.  Pt continues  to report decreased recall of passwords and misspelling of passwords resulting in getting locked out of programs.  Discussed letter reversals with typing and writing pertaining to aphasia vs coordination issue.  PERFORMANCE DEFICITS in functional skills including ADLs, IADLs, coordination, sensation, ROM, strength, pain, FMC, GMC, balance, body mechanics, endurance, decreased knowledge of use of DME, and UE functional use, cognitive skills including attention, problem solving, sequencing, and thought, and psychosocial skills including coping strategies, environmental adaptation, and routines and behaviors.   IMPAIRMENTS are limiting patient from ADLs and IADLs.   COMORBIDITIES may have co-morbidities  that affects occupational performance. Patient will benefit from skilled OT to address above impairments and improve overall function.  PLAN: OT FREQUENCY: 2x/week  OT DURATION: 8 weeks  PLANNED INTERVENTIONS: self care/ADL training, therapeutic exercise, therapeutic activity, neuromuscular re-education, balance training, functional mobility training, moist heat, cryotherapy, patient/family education, cognitive remediation/compensation, psychosocial skills training, energy conservation, coping strategies training, and DME and/or AE  instructions  RECOMMENDED OTHER SERVICES: N/A  CONSULTED AND AGREED WITH PLAN OF CARE: Patient  PLAN FOR NEXT SESSION: review coordination HEP if needed, [place] tasks to address handwriting and other alternating attention tasks, word games/association for aphasia and carryover to spelling. Attempt typing and/or computer mouse program, problem solving LB dressing and getting up/down from toilet   Chayce Robbins, [place], OTR/L 09/27/2021, 4:34 PM

## 2021-09-27 NOTE — Therapy (Addendum)
OUTPATIENT SPEECH LANGUAGE PATHOLOGY TREATMENT   Patient Name: [name] [name] MRN: [id number] DOB:1926/11/26, 88 y.o., male Today's Date: 09/27/2021  PCP: [name], MD REFERRING PROVIDER: Alysia Penna, MD   Visit Number 5      Number of Visits 17     Date for SLP Re-Evaluation 10/27/21     SLP Start Time 1405     SLP Stop Time  1445     SLP Time Calculation (min) 40 min     Activity Tolerance Patient tolerated treatment well        Past Medical History:  Diagnosis Date   AAA (abdominal aortic aneurysm) ([place])    Arthritis    Ascending aorta dilatation ([place]) 03/13/2012   Fusiform dilatation of the ascending thoracic aorta discovered during surgery, max diameter 4.2-4.3 cm   BPH (benign prostatic hypertrophy)    Chronic systolic heart failure ([place]) 03/03/2012   Chronic venous insufficiency    GERD (gastroesophageal reflux disease)    Glaucoma    Gout    History of kidney stones    Hyperlipidemia    Hypertension    Obesity (BMI 30-39.9)    Paroxysmal atrial fibrillation ([place]) 02/29/2012   Recurrent paroxysmal, new-onset    PMR (polymyalgia rheumatica) (HCC)    Polymyalgia rheumatica ([place]) 02/06/2019   Pre-diabetes    S/P CABG x 3 03/06/2012   LIMA to LAD, SVG to OM, SVG to RCA, EVH via right thigh   S/P Maze operation for atrial fibrillation 03/06/2012   Complete bilateral lesions set using bipolar radiofrequency and cryothermy ablation with clipping of LA appendage   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   s/p TAVR with a 26 mm Edwards via the left subclavian by Drs Buena Irish and Bartle   Severe aortic stenosis 09/29/2019   Sleep apnea    USES CPAP NIGHTLY   Past Surgical History:  Procedure Laterality Date   ABDOMINAL AORTOGRAM W/LOWER EXTREMITY N/A 10/26/2020   Procedure: ABDOMINAL AORTOGRAM W/LOWER EXTREMITY;  Surgeon: Broadus John, MD;  Location: [facility];  Service: Cardiovascular;  Laterality: N/A;   CARDIAC  CATHETERIZATION     CATARACT EXTRACTION, BILATERAL     with lens implants   CHOLECYSTECTOMY N/A 03/24/2012   Procedure: LAPAROSCOPIC CHOLECYSTECTOMY WITH INTRAOPERATIVE CHOLANGIOGRAM;  Surgeon: Zenovia Jarred, MD;  Location: [place];  Service: General;  Laterality: N/A;   CORONARY ARTERY BYPASS GRAFT N/A 03/06/2012   Procedure: CORONARY ARTERY BYPASS GRAFTING (CABG);  Surgeon: Rexene Alberts, MD;  Location: [facility];  Service: Open Heart Surgery;  Laterality: N/A;   ENDOVEIN HARVEST OF GREATER SAPHENOUS VEIN Right 03/06/2012   Procedure: ENDOVEIN HARVEST OF GREATER SAPHENOUS VEIN;  Surgeon: Rexene Alberts, MD;  Location: [place];  Service: Open Heart Surgery;  Laterality: Right;   INTRAOPERATIVE TRANSESOPHAGEAL ECHOCARDIOGRAM N/A 03/06/2012   Procedure: INTRAOPERATIVE TRANSESOPHAGEAL ECHOCARDIOGRAM;  Surgeon: Rexene Alberts, MD;  Location: [place];  Service: Open Heart Surgery;  Laterality: N/A;   IR KYPHO LUMBAR INC FX REDUCE BONE BX UNI/BIL CANNULATION INC/IMAGING  02/23/2020   LEFT HEART CATH Right 03/02/2012   Procedure: LEFT HEART CATH;  Surgeon: Sherren Mocha, MD;  Location: [facility];  Service: Cardiovascular;  Laterality: Right;   MAZE N/A 03/06/2012   Procedure: MAZE;  Surgeon: Rexene Alberts, MD;  Location: [place];  Service: Open Heart Surgery;  Laterality: N/A;   PERIPHERAL VASCULAR INTERVENTION Right 10/26/2020   Procedure: PERIPHERAL VASCULAR INTERVENTION;  Surgeon: Broadus John, MD;  Location: [place] [facility];  Service: Cardiovascular;  Laterality: Right;   PROSTATECTOMY     partial   RIGHT/LEFT HEART CATH AND CORONARY/GRAFT ANGIOGRAPHY N/A 09/29/2019   Procedure: RIGHT/LEFT HEART CATH AND CORONARY/GRAFT ANGIOGRAPHY;  Surgeon: Adrian Prows, MD;  Location: [place] [facility];  Service: Cardiovascular;  Laterality: N/A;   TEE WITHOUT CARDIOVERSION N/A 11/10/2019   Procedure: TRANSESOPHAGEAL ECHOCARDIOGRAM (TEE);  Surgeon: Burnell Blanks, MD;  Location: [place];  Service: Open Heart  Surgery;  Laterality: N/A;   TRANSCATHETER AORTIC VALVE REPLACEMENT, TRANSFEMORAL  11/10/2019   Patient Active Problem List   Diagnosis Date Noted   Left middle cerebral artery stroke ([place]) 05/31/2021   Acute CVA (cerebrovascular accident) ([place]) 05/25/2021   DNR (do not resuscitate) 05/25/2021   Osteoporosis 05/05/2021   Gouty arthritis 02/13/2021   Morbid obesity ([place]) 02/13/2021   Abdominal aortic aneurysm without rupture ([place]) 11/22/2020   Acute renal failure syndrome ([place]) 11/22/2020   Low back pain 11/22/2020   Skin tear of right upper arm without complication 97/67/3419   Pressure injury of skin 10/23/2020   Urinary tract infection with hematuria 10/22/2020   Encephalopathy 10/22/2020   AKI (acute kidney injury) ([place])    Overweight (BMI 25.0-29.9) 08/19/2020   Vertebral fracture, osteoporotic ([place]) 08/18/2020   T8 vertebral fracture ([place]) 08/17/2020   Thrombocytopenia ([place]) 08/17/2020   CKD (chronic kidney disease) stage 3, GFR 30-59 ml/min (HCC) 08/17/2020   Elevated troponin 08/17/2020   Pain due to onychomycosis of toenails of both feet 07/27/2020   Callus of heel 07/27/2020   Bifascicular block 11/11/2019   Acute on chronic diastolic heart failure ([place]) 11/10/2019   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   Severe aortic stenosis 09/29/2019   Aortic valve disorder 37/90/2409   Systolic heart failure ([place]) 03/11/2019   Thrombophilia ([place]) 02/26/2019   Muscle pain 02/11/2019   Weakness 02/06/2019   Polymyalgia rheumatica ([place]) 02/06/2019   Shoulder joint pain 02/02/2019   Anemia 01/26/2019   Malignant melanoma of skin ([place]) 01/26/2019   Pain in right leg 01/26/2019   DDD (degenerative disc disease), cervical 05/28/2018   Encounter for general adult medical examination without abnormal findings 11/19/2017   Carotid artery occlusion 07/03/2017   Hearing loss 07/03/2017   Fall 06/19/2017   Injury of upper extremity 06/19/2017   Neck pain  06/19/2017   Headache 06/19/2017   Visual disturbance 06/19/2017   Ganglion of hand 05/08/2017   Dilatation of aorta ([place]) 02/14/2017   Plantar fascial fibromatosis 02/14/2017   Cardiac murmur, unspecified 02/27/2016   Benign neoplasm of colon 08/24/2015   Dementia ([place]) 08/24/2015   Influenza due to unidentified influenza virus with other respiratory manifestations 03/15/2015   Epistaxis 12/01/2014   OSA (obstructive sleep apnea) 11/18/2014   Gastro-esophageal reflux disease with esophagitis 10/29/2014   Angina pectoris ([place]) 05/04/2014   Abnormal feces 07/31/2013   Other specified disorders of gingiva and edentulous alveolar ridge 07/31/2013   Spontaneous ecchymosis 10/10/2012   Thoracic ascending aortic aneurysm ([place]) 09/29/2012   Microscopic hematuria 04/11/2012   Proteinuria 04/11/2012   S/P laparoscopic cholecystectomy 04/09/2012   S/P CABG x 3 03/06/2012   S/P Maze operation for atrial fibrillation 03/06/2012   Paroxysmal atrial fibrillation ([place]) 02/29/2012   Bitten or stung by nonvenomous insect and other nonvenomous arthropods, initial encounter 12/03/2011   Depression screening 10/17/2011   CAD (coronary artery disease), native coronary artery    Hyperlipidemia    Hypertension    Chronic venous insufficiency  Gout    Benign prostatic hyperplasia    Post-traumatic stress disorder 03/13/2011   Pain in left leg 12/08/2010   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   Fatigue 08/08/2010   Retention of urine 04/18/2010   Constipation 07/05/2009   Kidney stone 07/05/2009   ED (erectile dysfunction) of organic origin 12/14/2008   Impaired fasting glucose 12/14/2008   Vitamin B deficiency 12/14/2008    ONSET DATE: 05/25/21   REFERRING DIAG: H06.237 (ICD-10-CM) - Left middle cerebral artery stroke   THERAPY DIAG:  Aphasia  Cognitive communication deficit  Rationale for Evaluation and Treatment Rehabilitation  SUBJECTIVE:   SUBJECTIVE STATEMENT: "Here is my  homework - a little tardy." Pt accompanied by: self  PERTINENT HISTORY:  NEHAL SHIVES is a 88 y.o. male with medical history significant of AAA; CAD s/p CABG; chronic systolic CHF; afib s/p Maze, on Xarelto; HTN; HLD; PMR (currently on steroids); pre-diabetes; OSA on CPAP; PAD with lymphedema; and AS s/p TAVR presenting with stroke-like symptoms on 05/25/21. FAmily had a meeting with bad news.  Shortly thereafter, he was trembling on one side with slurred speech.  His episode lasted less than a minute but the speech difficulty persisted. He completed home health PT OT and speech and arrives at OP rehab for his continuation of rehabilitative care.  Inpatient rehab discharge note: "Patient has made excellent gains and has met 5 of 5 long-term goals this admission due to improved motor speech, word finding skills, complex problem solving, and attention. Pt is completing high level cognitive-linguistic tasks with mod I-to-sup A in regards to problem solving, attention, and functional recall. Pt demonstrates improved self-monitoring and self correction of verbal errors when communicating at the phrase/sentence level. Pt continues to exhibit mild higher level word finding, spelling, motor speech production, and alternating attention deficits. Patient and family education is complete and patient to discharge at overall mod I-to-sup A level. Patient's care partner is independent to provide the necessary physical and cognitive assistance at discharge. Pt would continue to benefit from skilled ST services in order to maximize functional independence and reduce burden of care, with continued skilled ST services at discharge."  PAIN:  Are you having pain? Yes: NPRS scale: 3/10 Pain location: lumbar spine Pain description: aching, sore, sharp Aggravating factors: standing/walking Relieving factors: recliner  PATIENT GOALS  Speak intelligently, write with better spelling ability  OBJECTIVE:   TODAY'S TREATMENT:   09/27/21: Pt reports no additional social situations since last session but SLP encouraged pt to cont to engage in those situations socially, and to cont to use slower rate and other compensations which he is so successful with in therapy.  SLP targeted verbal expression in mod complex stimuli - with extra time pt self-corrected all responses. The last 5 minutes pt with more frequent self correction necessary. Homework provided for written and spoken responses.   09/25/21: "I was with friends yesterday - we went out for brunch - and I felt it more difficult to speak clearly. I really had to think about what I have to say." SLP and pt with long (10-minute) discussion on self-advocating in a conversation about his need for slower rate of speech in order to improve accuracy in verbal expression. Pt indicated understanding and agreed that self-advocating would be a good thing for him to do. With three-four sentence response stimuli (verbal) pt used slower rate and errors (approx 1 per sentence) were detected and remediated successfully. SLP provided pt homework for writing at word level, and speaking at sentence/2-sentence  level.  Pt named 8 items/60 seconds in a category with independence.  09/18/21: Tabius was able to communicate his intended message with success using slowed speech rate, circumlocution, and synonym strategies. He was aware of errors 100% of the time. SLP assisted pt with word level aphasia (written) with cues for error words and then min-mod cues for letter accuracy. Homework for written aphasia at the word/phrase level was provided.  09/13/21: Assessed pt's writing skills for language ability today. Jaxen made aphasic errors in all sets of responses (divergent naming tasks). Pt aware of errors 75% of the time. With error words SLP provided "fill in the blank", and pt with correct response 80% of the time. SLP provided him practice with written language accuracy, with instructions to wife to  assist if he has difficulty with the same means (fill in the blank).  08/29/21: Discussed evaluation results, probable goals, and discussed pt's need to write more and provide himself more opportunity for practice.   PATIENT EDUCATION: Education details: see above in "today's treatment" Person educated: Patient Education method: Explanation Education comprehension: verbalized understanding and needs further education    PATIENT REPORTED OUTCOME MEASURES (PROM): Communication Participation Item Bank: to be completed next session and Communication Effectiveness Survey: to be completed next session    GOALS: Goals reviewed with patient? Yes  SHORT TERM GOALS: Target date: 10/10/2021  - due to 4 therapy sessions prior to date of 09-26-21  Pt will name average 8 items/60 seconds in simple category important to pt in 3 sessions Baseline:09-25-21, 09-27-21 Goal status: Ongoing  2.  Pt will write to-do lists, errand lists (1-3 word responses) functionally in 3 sessions Baseline:  Goal status: Ongoing  3.  Pt will participate in 8 minutes functional mod complex conversation with modified independence in 2 sessions Baseline: 09-18-21 Goal status: Met    LONG TERM GOALS: Target date: 10/24/2021   Pt will write sentences of 5+ words, functionally, with rare min A in 3 sessions Baseline:  Goal status: Ongoing  2.   Pt will participate in 10 minutes functional mod complex-complex conversation with modified independence in 3 sessions Baseline:  Goal status: Ongoing   ASSESSMENT:  CLINICAL IMPRESSION: Patient is a 88 y.o. male who was seen today for speech and language therapy following CVA in May 2023. Pt's mod complex verbal expression continues functional with consistent extra time. SLP extended STG end date due to 4 therapy sessions prior to that date. See TX NOTE.  OBJECTIVE IMPAIRMENTS include expressive language and aphasia. These impairments are limiting patient from ADLs/IADLs  and effectively communicating at home and in community. Factors affecting potential to achieve goals and functional outcome are  none . Patient will benefit from skilled SLP services to address above impairments and improve overall function.  REHAB POTENTIAL: Good  PLAN: SLP FREQUENCY: 2x/week  SLP DURATION: 8 weeks  PLANNED INTERVENTIONS: Language facilitation, Environmental controls, Cueing hierachy, Internal/external aids, Oral motor exercises, Functional tasks, Multimodal communication approach, SLP instruction and feedback, Compensatory strategies, and Patient/family education    [place], [place] 09/27/2021, 2:18 PM

## 2021-09-27 NOTE — Therapy (Signed)
OUTPATIENT PHYSICAL THERAPY NEURO TREATMENT   Patient Name: [name] [name] MRN: [id number] DOB:[birth year], 88 y.o., male Today's Date: 09/27/2021   PCP: [name], MD REFERRING PROVIDER: Charlett Blake, MD    PT End of Session - 09/27/21 1443     Visit Number 6    Number of Visits 16    Date for PT Re-Evaluation 11/07/21    Authorization Type BCBS Medicare    Authorization Time Period no VL, no auth    PT Start Time 1447    PT Stop Time 1530    PT Time Calculation (min) 43 min    Equipment Utilized During Treatment Gait belt    Activity Tolerance Patient tolerated treatment well    Behavior During Therapy WFL for tasks assessed/performed                  Past Medical History:  Diagnosis Date   AAA (abdominal aortic aneurysm) ([place])    Arthritis    Ascending aorta dilatation ([place]) 03/13/2012   Fusiform dilatation of the ascending thoracic aorta discovered during surgery, max diameter 4.2-4.3 cm   BPH (benign prostatic hypertrophy)    Chronic systolic heart failure ([place]) 03/03/2012   Chronic venous insufficiency    GERD (gastroesophageal reflux disease)    Glaucoma    Gout    History of kidney stones    Hyperlipidemia    Hypertension    Obesity (BMI 30-39.9)    Paroxysmal atrial fibrillation ([place]) 02/29/2012   Recurrent paroxysmal, new-onset    PMR (polymyalgia rheumatica) (HCC)    Polymyalgia rheumatica ([place]) 02/06/2019   Pre-diabetes    S/P CABG x 3 03/06/2012   LIMA to LAD, SVG to OM, SVG to RCA, EVH via right thigh   S/P Maze operation for atrial fibrillation 03/06/2012   Complete bilateral lesions set using bipolar radiofrequency and cryothermy ablation with clipping of LA appendage   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   s/p TAVR with a 26 mm Edwards via the left subclavian by Drs Buena Irish and Bartle   Severe aortic stenosis 09/29/2019   Sleep apnea    USES CPAP NIGHTLY   Past Surgical History:  Procedure Laterality  Date   ABDOMINAL AORTOGRAM W/LOWER EXTREMITY N/A 10/26/2020   Procedure: ABDOMINAL AORTOGRAM W/LOWER EXTREMITY;  Surgeon: Broadus John, MD;  Location: [facility];  Service: Cardiovascular;  Laterality: N/A;   CARDIAC CATHETERIZATION     CATARACT EXTRACTION, BILATERAL     with lens implants   CHOLECYSTECTOMY N/A 03/24/2012   Procedure: LAPAROSCOPIC CHOLECYSTECTOMY WITH INTRAOPERATIVE CHOLANGIOGRAM;  Surgeon: Zenovia Jarred, MD;  Location: [facility];  Service: General;  Laterality: N/A;   CORONARY ARTERY BYPASS GRAFT N/A 03/06/2012   Procedure: CORONARY ARTERY BYPASS GRAFTING (CABG);  Surgeon: Rexene Alberts, MD;  Location: [facility];  Service: Open Heart Surgery;  Laterality: N/A;   ENDOVEIN HARVEST OF GREATER SAPHENOUS VEIN Right 03/06/2012   Procedure: ENDOVEIN HARVEST OF GREATER SAPHENOUS VEIN;  Surgeon: Rexene Alberts, MD;  Location: [facility];  Service: Open Heart Surgery;  Laterality: Right;   INTRAOPERATIVE TRANSESOPHAGEAL ECHOCARDIOGRAM N/A 03/06/2012   Procedure: INTRAOPERATIVE TRANSESOPHAGEAL ECHOCARDIOGRAM;  Surgeon: Rexene Alberts, MD;  Location: [facility];  Service: Open Heart Surgery;  Laterality: N/A;   IR KYPHO LUMBAR INC FX REDUCE BONE BX UNI/BIL CANNULATION INC/IMAGING  02/23/2020   LEFT HEART CATH Right 03/02/2012   Procedure: LEFT HEART CATH;  Surgeon: Sherren Mocha, MD;  Location: [facility];  Service: Cardiovascular;  Laterality: Right;   MAZE N/A 03/06/2012   Procedure: MAZE;  Surgeon: Rexene Alberts, MD;  Location: [facility];  Service: Open Heart Surgery;  Laterality: N/A;   PERIPHERAL VASCULAR INTERVENTION Right 10/26/2020   Procedure: PERIPHERAL VASCULAR INTERVENTION;  Surgeon: Broadus John, MD;  Location: [facility];  Service: Cardiovascular;  Laterality: Right;   PROSTATECTOMY     partial   RIGHT/LEFT HEART CATH AND CORONARY/GRAFT ANGIOGRAPHY N/A 09/29/2019   Procedure: RIGHT/LEFT HEART CATH AND CORONARY/GRAFT ANGIOGRAPHY;  Surgeon: Adrian Prows, MD;  Location: [facility];  Service: Cardiovascular;  Laterality: N/A;   TEE WITHOUT CARDIOVERSION N/A 11/10/2019   Procedure: TRANSESOPHAGEAL ECHOCARDIOGRAM (TEE);  Surgeon: Burnell Blanks, MD;  Location: [facility];  Service: Open Heart Surgery;  Laterality: N/A;   TRANSCATHETER AORTIC VALVE REPLACEMENT, TRANSFEMORAL  11/10/2019   Patient Active Problem List   Diagnosis Date Noted   Left middle cerebral artery stroke ([place]) 05/31/2021   Acute CVA (cerebrovascular accident) ([place]) 05/25/2021   DNR (do not resuscitate) 05/25/2021   Osteoporosis 05/05/2021   Gouty arthritis 02/13/2021   Morbid obesity ([place]) 02/13/2021   Abdominal aortic aneurysm without rupture ([place]) 11/22/2020   Acute renal failure syndrome ([place]) 11/22/2020   Low back pain 11/22/2020   Skin tear of right upper arm without complication 68/03/2120   Pressure injury of skin 10/23/2020   Urinary tract infection with hematuria 10/22/2020   Encephalopathy 10/22/2020   AKI (acute kidney injury) ([place])    Overweight (BMI 25.0-29.9) 08/19/2020   Vertebral fracture, osteoporotic ([place]) 08/18/2020   T8 vertebral fracture ([place]) 08/17/2020   Thrombocytopenia ([place]) 08/17/2020   CKD (chronic kidney disease) stage 3, GFR 30-59 ml/min (HCC) 08/17/2020   Elevated troponin 08/17/2020   Pain due to onychomycosis of toenails of both feet 07/27/2020   Callus of heel 07/27/2020   Bifascicular block 11/11/2019   Acute on chronic diastolic heart failure ([place]) 11/10/2019   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   Severe aortic stenosis 09/29/2019   Aortic valve disorder 48/25/0037   Systolic heart failure ([place]) 03/11/2019   Thrombophilia ([place]) 02/26/2019   Muscle pain 02/11/2019   Weakness 02/06/2019   Polymyalgia rheumatica ([place]) 02/06/2019   Shoulder joint pain 02/02/2019   Anemia 01/26/2019   Malignant melanoma of skin ([place]) 01/26/2019   Pain in right leg 01/26/2019   DDD (degenerative disc disease), cervical  05/28/2018   Encounter for general adult medical examination without abnormal findings 11/19/2017   Carotid artery occlusion 07/03/2017   Hearing loss 07/03/2017   Fall 06/19/2017   Injury of upper extremity 06/19/2017   Neck pain 06/19/2017   Headache 06/19/2017   Visual disturbance 06/19/2017   Ganglion of hand 05/08/2017   Dilatation of aorta ([place]) 02/14/2017   Plantar fascial fibromatosis 02/14/2017   Cardiac murmur, unspecified 02/27/2016   Benign neoplasm of colon 08/24/2015   Dementia ([place]) 08/24/2015   Influenza due to unidentified influenza virus with other respiratory manifestations 03/15/2015   Epistaxis 12/01/2014   OSA (obstructive sleep apnea) 11/18/2014   Gastro-esophageal reflux disease with esophagitis 10/29/2014   Angina pectoris ([place]) 05/04/2014   Abnormal feces 07/31/2013   Other specified disorders of gingiva and edentulous alveolar ridge 07/31/2013   Spontaneous ecchymosis 10/10/2012   Thoracic ascending aortic aneurysm ([place]) 09/29/2012   Microscopic hematuria 04/11/2012   Proteinuria 04/11/2012   S/P laparoscopic cholecystectomy 04/09/2012   S/P CABG x 3 03/06/2012   S/P Maze operation for atrial fibrillation  03/06/2012   Paroxysmal atrial fibrillation ([place]) 02/29/2012   Bitten or stung by nonvenomous insect and other nonvenomous arthropods, initial encounter 12/03/2011   Depression screening 10/17/2011   CAD (coronary artery disease), native coronary artery    Hyperlipidemia    Hypertension    Chronic venous insufficiency    Gout    Benign prostatic hyperplasia    Post-traumatic stress disorder 03/13/2011   Pain in left leg 12/08/2010   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   Fatigue 08/08/2010   Retention of urine 04/18/2010   Constipation 07/05/2009   Kidney stone 07/05/2009   ED (erectile dysfunction) of organic origin 12/14/2008   Impaired fasting glucose 12/14/2008   Vitamin B deficiency 12/14/2008    ONSET DATE: 05/31/21  REFERRING DIAG:  O46.950 (ICD-10-CM) - Left middle cerebral artery stroke (HCC)   THERAPY DIAG:  Muscle weakness (generalized)  Unsteadiness on feet  Other abnormalities of gait and mobility  Rationale for Evaluation and Treatment Rehabilitation  SUBJECTIVE:                                                                                                                                                                                              SUBJECTIVE STATEMENT: Had some soreness and shooting pains in back and legs after Monday.  Pain is not so bad when we do the exercises, but it goes up more after I'm here.  Reports wife still has to lift his legs into the bed.  Pt accompanied by: self  PERTINENT HISTORY: medical history significant of AAA; CAD s/p CABG; chronic systolic CHF; afib s/p Maze, on Xarelto; HTN; HLD; PMR (currently on steroids); pre-diabetes; OSA on CPAP; PAD with lymphedema; and AS s/p TAVR   PAIN:  Are you having pain? Yes: NPRS scale: 3/10 Pain location: lumbar spine Pain description: aching, sore, sharp Aggravating factors: standing/walking Relieving factors: recliner, hot pack, stretching  PRECAUTIONS: Fall, pressure sores bilateral calcaneus right > left  PATIENT GOALS be able to stand unsupported for a time to perform ADL, walking if possible  OBJECTIVE:    TODAY'S TREATMENT: 09/27/2021 Activity Comments  Seated glut squeeze x 10 reps  C/o back pain  Reviewed seated exercises of HEP Pt return demo understanding  Seated resisted hip abduction, yellow theraband, 10 reps   Seated step out and in, 2 x 5 reps   Seated heelslides 10 reps   Propel w/c with BLEs 30 ft-cues to kick/heel dig   Sit<>stand from w/c to parallel bars, 2 reps Mod assist  Stand 2 reps x 30 seconds; attempted 2nd rep to perform gentle ant/post rocking Pt c/o back pain, fatigue, needing  to sit  Seated gentle forward trunk flexion to upright posture, x 5 reps C/o back pain, discomfort  IFC on B LB 10  min, with MHP on LB 15 min 80-150 hz, intensity to tolerance; patient reported decreased pain during treatment  C/o pain shooting into bilat thighs with seated ex     HOME EXERCISE PROGRAM Last updated: 09/18/21 Access Code: [email or web address] URL: [email or web address] Date: 09/18/2021 Prepared by: [facility]  Exercises - Seated Heel Raise  - 1 x daily - 5 x weekly - 2 sets - 10 reps - Seated Toe Raise  - 1 x daily - 5 x weekly - 2 sets - 10 reps - Seated March  - 1 x daily - 5 x weekly - 2 sets - 10 reps - Seated Long Arc Quad  - 1 x daily - 5 x weekly - 2 sets - 10 reps - Seated Hip Adduction Squeeze with Ball  - 1 x daily - 5 x weekly - 2 sets - 10 reps - Row with Anchored Resistance on Swiss Ball  - 1 x daily - 5 x weekly - 2 sets - 10 reps - Seated Shoulder Extension and Scapular Retraction with Resistance on Swiss Ball  - 1 x daily - 5 x weekly - 2 sets - 10 reps    Below measures were taken at time of initial evaluation unless otherwise specified:   DIAGNOSTIC FINDINGS: IMPRESSION: Small areas of acute infarction in the right occipital lobe and left frontal lobe over the convexity. Mild to moderate acute infarct left operculum. Possible cerebral emboli.   Atrophy and mild chronic microvascular ischemia  COGNITION: Overall cognitive status: Within functional limits for tasks assessed Patient's historical recall is not very accurate, he reports his back injury presented x 9 months ago, whereas medical record notes encounter in 12/2019-01/2020 regarding L1 compression fx   SENSATION: Not tested  COORDINATION: Alt. Foot taps impaired due to dorsiflexion weakness  EDEMA:  Bilateral LE, wearing compression stockings. Uses lymphedema pump  MUSCLE TONE: DNT    POSTURE: rounded shoulders  LOWER EXTREMITY ROM:     Passive  Right Eval Left Eval  Hip flexion    Hip extension    Hip abduction    Hip adduction    Hip  internal rotation    Hip external rotation    Knee flexion    Knee extension    Ankle dorsiflexion -10 -10  Ankle plantarflexion    Ankle inversion    Ankle eversion     (Blank rows = not tested)  LOWER EXTREMITY MMT:    MMT Right Eval Left Eval  Hip flexion 3 3  Hip extension    Hip abduction 2+ 2+  Hip adduction 3 3  Hip internal rotation    Hip external rotation    Knee flexion 3+ 3+  Knee extension 4- 4-  Ankle dorsiflexion 0 0  Ankle plantarflexion    Ankle inversion    Ankle eversion    (Blank rows = not tested)  BED MOBILITY:  Sit to supine Min A and Mod A Supine to sit Min A Pt notes his bed at home has assist rails  TRANSFERS: Assistive device utilized: Wheelchair (manual)  Sit to stand: CGA and Min A Stand to sit: CGA and Min A Chair to chair: CGA and Min A Floor:  DNT   W/C mobility Independent level surfaces  STAIRS:  DNT--pt has elevator to enter/exit home  GAIT: DNT due to safety concerns  FUNCTIONAL TESTs:  5 times sit to stand: unable, requires BUE and assist Static standing: dependent on BUE via RW (supported standing) Straight Leg Raise: 20+ degrees extensor lag  PATIENT SURVEYS:  FOTO to be assessed     PATIENT EDUCATION: Education details: assessment findings Person educated: Patient Education method: Explanation Education comprehension: verbalized understanding   HOME EXERCISE PROGRAM: To be initiated, demonstration/rehearsal of hip add isometric    GOALS: Goals reviewed with patient? Yes  SHORT TERM GOALS: Target date: 09/26/2021  Patient will be independent in HEP to improve functional outcomes Baseline: Goal status: GOAL MET, 09/27/2021  2.  Patient to demonstrate sit to supine w/ supervision using adaptive equipment to reduce level of assist from caregiver Baseline: Min-mod A for BLE to clear EOB Goal status: per report 09/27/2021-NOT MET    LONG TERM GOALS: Target date: 10/24/2021  Demonstrate improved  hip abduction strength to 3+/5 to improve standing stability Baseline: 2+/5 Goal status: IN PROGRESS  2.  Manifest improved BLE strength as evidenced by ability to perform 5xSTS test in 30 sec using BUE Baseline: unable Goal status: IN PROGRESS  3.  Demonstrate static standing balance of Fair (no UE support) x 15 sec to improve safety with ADL performance Baseline: unable, dependent on BUE (Fair-) Goal status: IN PROGRESS  4.  Demonstrate transfers w/ set-up assist to increase independence and safety with mobility Baseline: CGA-min A Goal status: IN PROGRESS   ASSESSMENT:  CLINICAL IMPRESSION: Assessed STGs this visit, with pt meeting STG 1 for HEP and STG 2 not met, as pt reports wife has to help lift BLEs onto bed at night.  Today's session focused on seated leg and back exercises, as well as transfers and standing.  Pt able to perform short bouts of exercises, but does report back pain and shooting pain into bilateral thighs.  He does feel that TENS might be beneficial, and is planning to order for home.  He will continue to benefit from skilled PT towards goals for improved functional mobility and strength.    OBJECTIVE IMPAIRMENTS cardiopulmonary status limiting activity, decreased activity tolerance, decreased balance, decreased knowledge of use of DME, decreased mobility, difficulty walking, decreased ROM, decreased strength, impaired perceived functional ability, and pain.   ACTIVITY LIMITATIONS lifting, bending, standing, squatting, transfers, bed mobility, toileting, dressing, and locomotion level  PARTICIPATION LIMITATIONS: meal prep, cleaning, community activity, yard work, and home mgmt  PERSONAL FACTORS Age, Time since onset of injury/illness/exacerbation, and 3+ comorbidities: see PMH  are also affecting patient's functional outcome.   REHAB POTENTIAL: Good  CLINICAL DECISION MAKING: Evolving/moderate complexity  EVALUATION COMPLEXITY: Moderate  PLAN: PT FREQUENCY:  2x/week  PT DURATION: 8 weeks  PLANNED INTERVENTIONS: Therapeutic exercises, Therapeutic activity, Neuromuscular re-education, Balance training, Gait training, Patient/Family education, Self Care, Joint mobilization, Stair training, Vestibular training, Canalith repositioning, Orthotic/Fit training, DME instructions, Aquatic Therapy, Dry Needling, Electrical stimulation, Wheelchair mobility training, Spinal mobilization, Cryotherapy, Moist heat, Taping, Ionotophoresis 66m/ml Dexamethasone, and Manual therapy  PLAN FOR NEXT SESSION: seated pelvic tilts/core stability, try IFC to LB while performing standing activities to reduce pain; modalities to address LBP; nustep, LE strengthening, transfers   AMady Haagensen PT 09/27/21 5:24 PM Phone: [phone]: 3[place] [facility] [address] [place] [address]Phone # [phone] # [phone]

## 2021-09-30 DIAGNOSIS — I890 Lymphedema, not elsewhere classified: Secondary | ICD-10-CM | POA: Diagnosis not present

## 2021-10-02 ENCOUNTER — Ambulatory Visit: Payer: Medicare Other

## 2021-10-02 ENCOUNTER — Ambulatory Visit: Payer: Medicare Other | Attending: Physical Medicine & Rehabilitation

## 2021-10-02 ENCOUNTER — Telehealth: Payer: Self-pay | Admitting: Oncology

## 2021-10-02 ENCOUNTER — Ambulatory Visit: Payer: Medicare Other | Admitting: Occupational Therapy

## 2021-10-02 DIAGNOSIS — R2689 Other abnormalities of gait and mobility: Secondary | ICD-10-CM | POA: Diagnosis not present

## 2021-10-02 DIAGNOSIS — R41841 Cognitive communication deficit: Secondary | ICD-10-CM | POA: Insufficient documentation

## 2021-10-02 DIAGNOSIS — R208 Other disturbances of skin sensation: Secondary | ICD-10-CM | POA: Insufficient documentation

## 2021-10-02 DIAGNOSIS — R262 Difficulty in walking, not elsewhere classified: Secondary | ICD-10-CM | POA: Insufficient documentation

## 2021-10-02 DIAGNOSIS — M6281 Muscle weakness (generalized): Secondary | ICD-10-CM

## 2021-10-02 DIAGNOSIS — R4701 Aphasia: Secondary | ICD-10-CM | POA: Diagnosis not present

## 2021-10-02 DIAGNOSIS — R2681 Unsteadiness on feet: Secondary | ICD-10-CM

## 2021-10-02 DIAGNOSIS — R41840 Attention and concentration deficit: Secondary | ICD-10-CM | POA: Insufficient documentation

## 2021-10-02 DIAGNOSIS — I69351 Hemiplegia and hemiparesis following cerebral infarction affecting right dominant side: Secondary | ICD-10-CM | POA: Insufficient documentation

## 2021-10-02 NOTE — Therapy (Signed)
OUTPATIENT PHYSICAL THERAPY NEURO TREATMENT   Patient Name: [name] [name] MRN: [id number] DOB:03/21/1926, 88 y.o., male Today's Date: 10/02/2021   PCP: [name], MD REFERRING PROVIDER: Charlett Blake, MD    PT End of Session - 10/02/21 1536     Visit Number 7    Number of Visits 16    Date for PT Re-Evaluation 11/07/21    Authorization Type BCBS Medicare    Authorization Time Period no VL, no auth    PT Start Time 1530    PT Stop Time 1615    PT Time Calculation (min) 45 min    Equipment Utilized During Treatment Gait belt    Activity Tolerance Patient tolerated treatment well    Behavior During Therapy WFL for tasks assessed/performed                  Past Medical History:  Diagnosis Date   AAA (abdominal aortic aneurysm) ([place])    Arthritis    Ascending aorta dilatation ([place]) 03/13/2012   Fusiform dilatation of the ascending thoracic aorta discovered during surgery, max diameter 4.2-4.3 cm   BPH (benign prostatic hypertrophy)    Chronic systolic heart failure ([place]) 03/03/2012   Chronic venous insufficiency    GERD (gastroesophageal reflux disease)    Glaucoma    Gout    History of kidney stones    Hyperlipidemia    Hypertension    Obesity (BMI 30-39.9)    Paroxysmal atrial fibrillation ([place]) 02/29/2012   Recurrent paroxysmal, new-onset    PMR (polymyalgia rheumatica) (HCC)    Polymyalgia rheumatica ([place]) 02/06/2019   Pre-diabetes    S/P CABG x 3 03/06/2012   LIMA to LAD, SVG to OM, SVG to RCA, EVH via right thigh   S/P Maze operation for atrial fibrillation 03/06/2012   Complete bilateral lesions set using bipolar radiofrequency and cryothermy ablation with clipping of LA appendage   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   s/p TAVR with a 26 mm Edwards via the left subclavian by Drs Buena Irish and Bartle   Severe aortic stenosis 09/29/2019   Sleep apnea    USES CPAP NIGHTLY   Past Surgical History:  Procedure Laterality  Date   ABDOMINAL AORTOGRAM W/LOWER EXTREMITY N/A 10/26/2020   Procedure: ABDOMINAL AORTOGRAM W/LOWER EXTREMITY;  Surgeon: Broadus John, MD;  Location: [facility];  Service: Cardiovascular;  Laterality: N/A;   CARDIAC CATHETERIZATION     CATARACT EXTRACTION, BILATERAL     with lens implants   CHOLECYSTECTOMY N/A 03/24/2012   Procedure: LAPAROSCOPIC CHOLECYSTECTOMY WITH INTRAOPERATIVE CHOLANGIOGRAM;  Surgeon: Zenovia Jarred, MD;  Location: [facility];  Service: General;  Laterality: N/A;   CORONARY ARTERY BYPASS GRAFT N/A 03/06/2012   Procedure: CORONARY ARTERY BYPASS GRAFTING (CABG);  Surgeon: Rexene Alberts, MD;  Location: [facility];  Service: Open Heart Surgery;  Laterality: N/A;   ENDOVEIN HARVEST OF GREATER SAPHENOUS VEIN Right 03/06/2012   Procedure: ENDOVEIN HARVEST OF GREATER SAPHENOUS VEIN;  Surgeon: Rexene Alberts, MD;  Location: [facility];  Service: Open Heart Surgery;  Laterality: Right;   INTRAOPERATIVE TRANSESOPHAGEAL ECHOCARDIOGRAM N/A 03/06/2012   Procedure: INTRAOPERATIVE TRANSESOPHAGEAL ECHOCARDIOGRAM;  Surgeon: Rexene Alberts, MD;  Location: [facility];  Service: Open Heart Surgery;  Laterality: N/A;   IR KYPHO LUMBAR INC FX REDUCE BONE BX UNI/BIL CANNULATION INC/IMAGING  02/23/2020   LEFT HEART CATH Right 03/02/2012   Procedure: LEFT HEART CATH;  Surgeon: Sherren Mocha, MD;  Location: [facility];  Service: Cardiovascular;  Laterality: Right;   MAZE N/A 03/06/2012   Procedure: MAZE;  Surgeon: Rexene Alberts, MD;  Location: [facility];  Service: Open Heart Surgery;  Laterality: N/A;   PERIPHERAL VASCULAR INTERVENTION Right 10/26/2020   Procedure: PERIPHERAL VASCULAR INTERVENTION;  Surgeon: Broadus John, MD;  Location: [facility];  Service: Cardiovascular;  Laterality: Right;   PROSTATECTOMY     partial   RIGHT/LEFT HEART CATH AND CORONARY/GRAFT ANGIOGRAPHY N/A 09/29/2019   Procedure: RIGHT/LEFT HEART CATH AND CORONARY/GRAFT ANGIOGRAPHY;  Surgeon: Adrian Prows, MD;  Location: [facility];  Service: Cardiovascular;  Laterality: N/A;   TEE WITHOUT CARDIOVERSION N/A 11/10/2019   Procedure: TRANSESOPHAGEAL ECHOCARDIOGRAM (TEE);  Surgeon: Burnell Blanks, MD;  Location: [facility];  Service: Open Heart Surgery;  Laterality: N/A;   TRANSCATHETER AORTIC VALVE REPLACEMENT, TRANSFEMORAL  11/10/2019   Patient Active Problem List   Diagnosis Date Noted   Left middle cerebral artery stroke ([place]) 05/31/2021   Acute CVA (cerebrovascular accident) ([place]) 05/25/2021   DNR (do not resuscitate) 05/25/2021   Osteoporosis 05/05/2021   Gouty arthritis 02/13/2021   Morbid obesity ([place]) 02/13/2021   Abdominal aortic aneurysm without rupture ([place]) 11/22/2020   Acute renal failure syndrome ([place]) 11/22/2020   Low back pain 11/22/2020   Skin tear of right upper arm without complication 51/76/1607   Pressure injury of skin 10/23/2020   Urinary tract infection with hematuria 10/22/2020   Encephalopathy 10/22/2020   AKI (acute kidney injury) ([place])    Overweight (BMI 25.0-29.9) 08/19/2020   Vertebral fracture, osteoporotic ([place]) 08/18/2020   T8 vertebral fracture ([place]) 08/17/2020   Thrombocytopenia ([place]) 08/17/2020   CKD (chronic kidney disease) stage 3, GFR 30-59 ml/min (HCC) 08/17/2020   Elevated troponin 08/17/2020   Pain due to onychomycosis of toenails of both feet 07/27/2020   Callus of heel 07/27/2020   Bifascicular block 11/11/2019   Acute on chronic diastolic heart failure ([place]) 11/10/2019   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   Severe aortic stenosis 09/29/2019   Aortic valve disorder 37/10/6267   Systolic heart failure ([place]) 03/11/2019   Thrombophilia ([place]) 02/26/2019   Muscle pain 02/11/2019   Weakness 02/06/2019   Polymyalgia rheumatica ([place]) 02/06/2019   Shoulder joint pain 02/02/2019   Anemia 01/26/2019   Malignant melanoma of skin ([place]) 01/26/2019   Pain in right leg 01/26/2019   DDD (degenerative disc disease), cervical  05/28/2018   Encounter for general adult medical examination without abnormal findings 11/19/2017   Carotid artery occlusion 07/03/2017   Hearing loss 07/03/2017   Fall 06/19/2017   Injury of upper extremity 06/19/2017   Neck pain 06/19/2017   Headache 06/19/2017   Visual disturbance 06/19/2017   Ganglion of hand 05/08/2017   Dilatation of aorta ([place]) 02/14/2017   Plantar fascial fibromatosis 02/14/2017   Cardiac murmur, unspecified 02/27/2016   Benign neoplasm of colon 08/24/2015   Dementia ([place]) 08/24/2015   Influenza due to unidentified influenza virus with other respiratory manifestations 03/15/2015   Epistaxis 12/01/2014   OSA (obstructive sleep apnea) 11/18/2014   Gastro-esophageal reflux disease with esophagitis 10/29/2014   Angina pectoris ([place]) 05/04/2014   Abnormal feces 07/31/2013   Other specified disorders of gingiva and edentulous alveolar ridge 07/31/2013   Spontaneous ecchymosis 10/10/2012   Thoracic ascending aortic aneurysm ([place]) 09/29/2012   Microscopic hematuria 04/11/2012   Proteinuria 04/11/2012   S/P laparoscopic cholecystectomy 04/09/2012   S/P CABG x 3 03/06/2012   S/P Maze operation for atrial fibrillation  03/06/2012   Paroxysmal atrial fibrillation ([place]) 02/29/2012   Bitten or stung by nonvenomous insect and other nonvenomous arthropods, initial encounter 12/03/2011   Depression screening 10/17/2011   CAD (coronary artery disease), native coronary artery    Hyperlipidemia    Hypertension    Chronic venous insufficiency    Gout    Benign prostatic hyperplasia    Post-traumatic stress disorder 03/13/2011   Pain in left leg 12/08/2010   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   Fatigue 08/08/2010   Retention of urine 04/18/2010   Constipation 07/05/2009   Kidney stone 07/05/2009   ED (erectile dysfunction) of organic origin 12/14/2008   Impaired fasting glucose 12/14/2008   Vitamin B deficiency 12/14/2008    ONSET DATE: 05/31/21  REFERRING DIAG:  H03.888 (ICD-10-CM) - Left middle cerebral artery stroke (HCC)   THERAPY DIAG:  Muscle weakness (generalized)  Unsteadiness on feet  Other abnormalities of gait and mobility  Difficulty in walking, not elsewhere classified  Rationale for Evaluation and Treatment Rehabilitation  SUBJECTIVE:                                                                                                                                                                                              SUBJECTIVE STATEMENT: Not feeling too much change.  Somewhat frustrating that the leg exercises also irritate the low back  Pt accompanied by: self  PERTINENT HISTORY: medical history significant of AAA; CAD s/p CABG; chronic systolic CHF; afib s/p Maze, on Xarelto; HTN; HLD; PMR (currently on steroids); pre-diabetes; OSA on CPAP; PAD with lymphedema; and AS s/p TAVR   PAIN:  Are you having pain? Yes: NPRS scale: 3/10 Pain location: lumbar spine Pain description: aching, sore, sharp Aggravating factors: standing/walking Relieving factors: recliner, hot pack, stretching  PRECAUTIONS: Fall, pressure sores bilateral calcaneus right > left  PATIENT GOALS be able to stand unsupported for a time to perform ADL, walking if possible  OBJECTIVE:    TODAY'S TREATMENT: 10/02/21 Activity Comments  Seated ther ex Ankle pumps: 20x Seated hip add/abd 2x10 LAQ 2x10  Portable TENS applied to lumbar Cross polar set-up channel 1-2 and intensity to 3  Sit to stand at sink 3x mod A, static standing 1-3 min   Lateral step and reach 1x5 reps at counter, therapist providing cues to proximal pelvis for compression/tactile assist  Standing at sink, foot in cabinet 4x10 sec Assist for lifting RLE in/out and therapist stabilizing at pelvis for proximal support       TODAY'S TREATMENT: 09/27/2021 Activity Comments  Seated glut squeeze x 10 reps  C/o back pain  Reviewed seated exercises of HEP  Pt return demo understanding   Seated resisted hip abduction, yellow theraband, 10 reps   Seated step out and in, 2 x 5 reps   Seated heelslides 10 reps   Propel w/c with BLEs 30 ft-cues to kick/heel dig   Sit<>stand from w/c to parallel bars, 2 reps Mod assist  Stand 2 reps x 30 seconds; attempted 2nd rep to perform gentle ant/post rocking Pt c/o back pain, fatigue, needing to sit  Seated gentle forward trunk flexion to upright posture, x 5 reps C/o back pain, discomfort  IFC on B LB 10 min, with MHP on LB 15 min 80-150 hz, intensity to tolerance; patient reported decreased pain during treatment  C/o pain shooting into bilat thighs with seated ex     HOME EXERCISE PROGRAM Last updated: 09/18/21 Access Code: [email or web address] URL: [email or web address] Date: 09/18/2021 Prepared by: [facility]  Exercises - Seated Heel Raise  - 1 x daily - 5 x weekly - 2 sets - 10 reps - Seated Toe Raise  - 1 x daily - 5 x weekly - 2 sets - 10 reps - Seated March  - 1 x daily - 5 x weekly - 2 sets - 10 reps - Seated Long Arc Quad  - 1 x daily - 5 x weekly - 2 sets - 10 reps - Seated Hip Adduction Squeeze with Ball  - 1 x daily - 5 x weekly - 2 sets - 10 reps - Row with Anchored Resistance on Swiss Ball  - 1 x daily - 5 x weekly - 2 sets - 10 reps - Seated Shoulder Extension and Scapular Retraction with Resistance on Swiss Ball  - 1 x daily - 5 x weekly - 2 sets - 10 reps    Below measures were taken at time of initial evaluation unless otherwise specified:   DIAGNOSTIC FINDINGS: IMPRESSION: Small areas of acute infarction in the right occipital lobe and left frontal lobe over the convexity. Mild to moderate acute infarct left operculum. Possible cerebral emboli.   Atrophy and mild chronic microvascular ischemia  COGNITION: Overall cognitive status: Within functional limits for tasks assessed Patient's historical recall is not very accurate, he reports his back injury presented x  9 months ago, whereas medical record notes encounter in 12/2019-01/2020 regarding L1 compression fx   SENSATION: Not tested  COORDINATION: Alt. Foot taps impaired due to dorsiflexion weakness  EDEMA:  Bilateral LE, wearing compression stockings. Uses lymphedema pump  MUSCLE TONE: DNT    POSTURE: rounded shoulders  LOWER EXTREMITY ROM:     Passive  Right Eval Left Eval  Hip flexion    Hip extension    Hip abduction    Hip adduction    Hip internal rotation    Hip external rotation    Knee flexion    Knee extension    Ankle dorsiflexion -10 -10  Ankle plantarflexion    Ankle inversion    Ankle eversion     (Blank rows = not tested)  LOWER EXTREMITY MMT:    MMT Right Eval Left Eval  Hip flexion 3 3  Hip extension    Hip abduction 2+ 2+  Hip adduction 3 3  Hip internal rotation    Hip external rotation    Knee flexion 3+ 3+  Knee extension 4- 4-  Ankle dorsiflexion 0 0  Ankle plantarflexion    Ankle inversion    Ankle eversion    (Blank rows = not tested)  BED MOBILITY:  Sit to supine Min A and Mod A Supine to sit Min A Pt notes his bed at home has assist rails  TRANSFERS: Assistive device utilized: Wheelchair (manual)  Sit to stand: CGA and Min A Stand to sit: CGA and Min A Chair to chair: CGA and Min A Floor:  DNT   W/C mobility Independent level surfaces  STAIRS:  DNT--pt has elevator to enter/exit home  GAIT: DNT due to safety concerns  FUNCTIONAL TESTs:  5 times sit to stand: unable, requires BUE and assist Static standing: dependent on BUE via RW (supported standing) Straight Leg Raise: 20+ degrees extensor lag  PATIENT SURVEYS:  FOTO to be assessed     PATIENT EDUCATION: Education details: assessment findings Person educated: Patient Education method: Explanation Education comprehension: verbalized understanding   HOME EXERCISE PROGRAM: To be initiated, demonstration/rehearsal of hip add isometric    GOALS: Goals  reviewed with patient? Yes  SHORT TERM GOALS: Target date: 09/26/2021  Patient will be independent in HEP to improve functional outcomes Baseline: Goal status: GOAL MET, 09/27/2021  2.  Patient to demonstrate sit to supine w/ supervision using adaptive equipment to reduce level of assist from caregiver Baseline: Min-mod A for BLE to clear EOB Goal status: per report 09/27/2021-NOT MET    LONG TERM GOALS: Target date: 10/24/2021  Demonstrate improved hip abduction strength to 3+/5 to improve standing stability Baseline: 2+/5 Goal status: IN PROGRESS  2.  Manifest improved BLE strength as evidenced by ability to perform 5xSTS test in 30 sec using BUE Baseline: unable Goal status: IN PROGRESS  3.  Demonstrate static standing balance of Fair (no UE support) x 15 sec to improve safety with ADL performance Baseline: unable, dependent on BUE (Fair-) Goal status: IN PROGRESS  4.  Demonstrate transfers w/ set-up assist to increase independence and safety with mobility Baseline: CGA-min A Goal status: IN PROGRESS   ASSESSMENT:  CLINICAL IMPRESSION: Pt notes improved standing tolerance with use of TENS and performing various dynamic lower body and upper body coupled with therapist providing tactile cues for proximal stability. Discussed purchase of TENS unit and leg lifter to aid in mobility and reduce pain associated with active movement  OBJECTIVE IMPAIRMENTS cardiopulmonary status limiting activity, decreased activity tolerance, decreased balance, decreased knowledge of use of DME, decreased mobility, difficulty walking, decreased ROM, decreased strength, impaired perceived functional ability, and pain.   ACTIVITY LIMITATIONS lifting, bending, standing, squatting, transfers, bed mobility, toileting, dressing, and locomotion level  PARTICIPATION LIMITATIONS: meal prep, cleaning, community activity, yard work, and home mgmt  PERSONAL FACTORS Age, Time since onset of  injury/illness/exacerbation, and 3+ comorbidities: see PMH  are also affecting patient's functional outcome.   REHAB POTENTIAL: Good  CLINICAL DECISION MAKING: Evolving/moderate complexity  EVALUATION COMPLEXITY: Moderate  PLAN: PT FREQUENCY: 2x/week  PT DURATION: 8 weeks  PLANNED INTERVENTIONS: Therapeutic exercises, Therapeutic activity, Neuromuscular re-education, Balance training, Gait training, Patient/Family education, Self Care, Joint mobilization, Stair training, Vestibular training, Canalith repositioning, Orthotic/Fit training, DME instructions, Aquatic Therapy, Dry Needling, Electrical stimulation, Wheelchair mobility training, Spinal mobilization, Cryotherapy, Moist heat, Taping, Ionotophoresis 34m/ml Dexamethasone, and Manual therapy  PLAN FOR NEXT SESSION: seated pelvic tilts/core stability, try IFC to LB while performing standing activities to reduce pain; modalities to address LBP; nustep, LE strengthening, transfers   5:10 PM, 10/02/21 M. KSherlyn Lees PT, DPT Physical Therapist- CCarrsvilleOffice Number: [phone]   CBordelonvilleat BSpecial[facility]mouth[address] SCow CreekGPaints[place] [address]Phone # [phone] Fax # [phone]

## 2021-10-02 NOTE — Therapy (Signed)
OUTPATIENT OCCUPATIONAL THERAPY  TREATMENT NOTE  Patient Name: VERGIL BURBY MRN: [id number] DOB:01-18-1926, 88 y.o., male Today's Date: 10/02/2021  PCP: [name], MD REFERRING PROVIDER: Charlett Blake, MD    OT End of Session - 10/02/21 1541     Visit Number 8    Number of Visits 17    Date for OT Re-Evaluation 10/27/21    Authorization Type BCBS Medicare    OT Start Time 1449    OT Stop Time 1531    OT Time Calculation (min) 42 min    Activity Tolerance Patient tolerated treatment well    Behavior During Therapy WFL for tasks assessed/performed                 Past Medical History:  Diagnosis Date   AAA (abdominal aortic aneurysm) ([place])    Arthritis    Ascending aorta dilatation ([place]) 03/13/2012   Fusiform dilatation of the ascending thoracic aorta discovered during surgery, max diameter 4.2-4.3 cm   BPH (benign prostatic hypertrophy)    Chronic systolic heart failure ([place]) 03/03/2012   Chronic venous insufficiency    GERD (gastroesophageal reflux disease)    Glaucoma    Gout    History of kidney stones    Hyperlipidemia    Hypertension    Obesity (BMI 30-39.9)    Paroxysmal atrial fibrillation ([place]) 02/29/2012   Recurrent paroxysmal, new-onset    PMR (polymyalgia rheumatica) (HCC)    Polymyalgia rheumatica ([place]) 02/06/2019   Pre-diabetes    S/P CABG x 3 03/06/2012   LIMA to LAD, SVG to OM, SVG to RCA, EVH via right thigh   S/P Maze operation for atrial fibrillation 03/06/2012   Complete bilateral lesions set using bipolar radiofrequency and cryothermy ablation with clipping of LA appendage   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   s/p TAVR with a 26 mm Edwards via the left subclavian by Drs Buena Irish and Bartle   Severe aortic stenosis 09/29/2019   Sleep apnea    USES CPAP NIGHTLY   Past Surgical History:  Procedure Laterality Date   ABDOMINAL AORTOGRAM W/LOWER EXTREMITY N/A 10/26/2020   Procedure: ABDOMINAL AORTOGRAM  W/LOWER EXTREMITY;  Surgeon: Broadus John, MD;  Location: [facility];  Service: Cardiovascular;  Laterality: N/A;   CARDIAC CATHETERIZATION     CATARACT EXTRACTION, BILATERAL     with lens implants   CHOLECYSTECTOMY N/A 03/24/2012   Procedure: LAPAROSCOPIC CHOLECYSTECTOMY WITH INTRAOPERATIVE CHOLANGIOGRAM;  Surgeon: Zenovia Jarred, MD;  Location: [facility];  Service: General;  Laterality: N/A;   CORONARY ARTERY BYPASS GRAFT N/A 03/06/2012   Procedure: CORONARY ARTERY BYPASS GRAFTING (CABG);  Surgeon: Rexene Alberts, MD;  Location: [facility];  Service: Open Heart Surgery;  Laterality: N/A;   ENDOVEIN HARVEST OF GREATER SAPHENOUS VEIN Right 03/06/2012   Procedure: ENDOVEIN HARVEST OF GREATER SAPHENOUS VEIN;  Surgeon: Rexene Alberts, MD;  Location: [facility];  Service: Open Heart Surgery;  Laterality: Right;   INTRAOPERATIVE TRANSESOPHAGEAL ECHOCARDIOGRAM N/A 03/06/2012   Procedure: INTRAOPERATIVE TRANSESOPHAGEAL ECHOCARDIOGRAM;  Surgeon: Rexene Alberts, MD;  Location: [facility];  Service: Open Heart Surgery;  Laterality: N/A;   IR KYPHO LUMBAR INC FX REDUCE BONE BX UNI/BIL CANNULATION INC/IMAGING  02/23/2020   LEFT HEART CATH Right 03/02/2012   Procedure: LEFT HEART CATH;  Surgeon: Sherren Mocha, MD;  Location: [facility];  Service: Cardiovascular;  Laterality: Right;   MAZE N/A 03/06/2012   Procedure: MAZE;  Surgeon: Rexene Alberts, MD;  Location: [facility];  Service: Open Heart Surgery;  Laterality: N/A;   PERIPHERAL VASCULAR INTERVENTION Right 10/26/2020   Procedure: PERIPHERAL VASCULAR INTERVENTION;  Surgeon: Broadus John, MD;  Location: [facility];  Service: Cardiovascular;  Laterality: Right;   PROSTATECTOMY     partial   RIGHT/LEFT HEART CATH AND CORONARY/GRAFT ANGIOGRAPHY N/A 09/29/2019   Procedure: RIGHT/LEFT HEART CATH AND CORONARY/GRAFT ANGIOGRAPHY;  Surgeon: Adrian Prows, MD;  Location: [facility];  Service: Cardiovascular;  Laterality: N/A;   TEE WITHOUT CARDIOVERSION N/A  11/10/2019   Procedure: TRANSESOPHAGEAL ECHOCARDIOGRAM (TEE);  Surgeon: Burnell Blanks, MD;  Location: [place];  Service: Open Heart Surgery;  Laterality: N/A;   TRANSCATHETER AORTIC VALVE REPLACEMENT, TRANSFEMORAL  11/10/2019   Patient Active Problem List   Diagnosis Date Noted   Left middle cerebral artery stroke ([place]) 05/31/2021   Acute CVA (cerebrovascular accident) ([place]) 05/25/2021   DNR (do not resuscitate) 05/25/2021   Osteoporosis 05/05/2021   Gouty arthritis 02/13/2021   Morbid obesity ([place]) 02/13/2021   Abdominal aortic aneurysm without rupture ([place]) 11/22/2020   Acute renal failure syndrome ([place]) 11/22/2020   Low back pain 11/22/2020   Skin tear of right upper arm without complication 29/56/2130   Pressure injury of skin 10/23/2020   Urinary tract infection with hematuria 10/22/2020   Encephalopathy 10/22/2020   AKI (acute kidney injury) ([place])    Overweight (BMI 25.0-29.9) 08/19/2020   Vertebral fracture, osteoporotic ([place]) 08/18/2020   T8 vertebral fracture ([place]) 08/17/2020   Thrombocytopenia ([place]) 08/17/2020   CKD (chronic kidney disease) stage 3, GFR 30-59 ml/min (HCC) 08/17/2020   Elevated troponin 08/17/2020   Pain due to onychomycosis of toenails of both feet 07/27/2020   Callus of heel 07/27/2020   Bifascicular block 11/11/2019   Acute on chronic diastolic heart failure ([place]) 11/10/2019   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   Severe aortic stenosis 09/29/2019   Aortic valve disorder 86/57/8469   Systolic heart failure ([place]) 03/11/2019   Thrombophilia ([place]) 02/26/2019   Muscle pain 02/11/2019   Weakness 02/06/2019   Polymyalgia rheumatica ([place]) 02/06/2019   Shoulder joint pain 02/02/2019   Anemia 01/26/2019   Malignant melanoma of skin ([place]) 01/26/2019   Pain in right leg 01/26/2019   DDD (degenerative disc disease), cervical 05/28/2018   Encounter for general adult medical examination without abnormal findings 11/19/2017    Carotid artery occlusion 07/03/2017   Hearing loss 07/03/2017   Fall 06/19/2017   Injury of upper extremity 06/19/2017   Neck pain 06/19/2017   Headache 06/19/2017   Visual disturbance 06/19/2017   Ganglion of hand 05/08/2017   Dilatation of aorta ([place]) 02/14/2017   Plantar fascial fibromatosis 02/14/2017   Cardiac murmur, unspecified 02/27/2016   Benign neoplasm of colon 08/24/2015   Dementia ([place]) 08/24/2015   Influenza due to unidentified influenza virus with other respiratory manifestations 03/15/2015   Epistaxis 12/01/2014   OSA (obstructive sleep apnea) 11/18/2014   Gastro-esophageal reflux disease with esophagitis 10/29/2014   Angina pectoris ([place]) 05/04/2014   Abnormal feces 07/31/2013   Other specified disorders of gingiva and edentulous alveolar ridge 07/31/2013   Spontaneous ecchymosis 10/10/2012   Thoracic ascending aortic aneurysm ([place]) 09/29/2012   Microscopic hematuria 04/11/2012   Proteinuria 04/11/2012   S/P laparoscopic cholecystectomy 04/09/2012   S/P CABG x 3 03/06/2012   S/P Maze operation for atrial fibrillation 03/06/2012   Paroxysmal atrial fibrillation ([place]) 02/29/2012   Bitten or stung by nonvenomous insect and other nonvenomous arthropods, initial  encounter 12/03/2011   Depression screening 10/17/2011   CAD (coronary artery disease), native coronary artery    Hyperlipidemia    Hypertension    Chronic venous insufficiency    Gout    Benign prostatic hyperplasia    Post-traumatic stress disorder 03/13/2011   Pain in left leg 12/08/2010   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   Fatigue 08/08/2010   Retention of urine 04/18/2010   Constipation 07/05/2009   Kidney stone 07/05/2009   ED (erectile dysfunction) of organic origin 12/14/2008   Impaired fasting glucose 12/14/2008   Vitamin B deficiency 12/14/2008    ONSET DATE: 05/25/2021  REFERRING DIAG: V85.929 (ICD-10-CM) - Left middle cerebral artery stroke   THERAPY DIAG:  Hemiplegia and  hemiparesis following cerebral infarction affecting right dominant side (HCC)  Attention and concentration deficit  Other disturbances of skin sensation  Muscle weakness (generalized)  Unsteadiness on feet  Rationale for Evaluation and Treatment Rehabilitation  SUBJECTIVE:   SUBJECTIVE STATEMENT: Pt reports that he feels like his "muscles are getting very aggravated" and wonders if it is due to the physical therapy. Pt accompanied by: self and significant other (dropped off pt)  PERTINENT HISTORY:  H/o AAA, CAD with CABG, compression fractures, chronic anemia followed by hematology services, chronic systolic congestive heart failure, A-fib/AF status post MAZE on Xarelto, hypertension, hyperlipidemia, PMR maintained on steroids, prediabetes, early dementia maintained on Namenda, chronic lower extremity wounds with Unna boot OSA with CPAP, PAD with lymphedema status post balloon angioplasty of posterior tibial artery October 2022 recent UTI placed on antibiotic therapy.  He does have a personal care attendant Monday Wednesday Friday for 3 hours.  Modified independent transfers power wheelchair bound x3 to 4 months prior to that ambulating short distances with a rollator.  Present 05/25/2021 with right-sided weakness and aphasia of acute onset.  Cranial CT scan showed small left MCA territory cortical infarct within the mid to posterior left frontal lobe.  CT angiogram head and neck no large vessel occlusion.   PRECAUTIONS: Fall  PAIN:  Are you having pain? Yes: NPRS scale: 3/10 Pain location: mid back and legs Pain description: constant; dull, achy Aggravating factors: standing, certain movements/exercises Relieving factors: injections, flat on back  FALLS: Has patient fallen in last 6 months? Yes. Number of falls 4, has not fallen recently, per pt report  LIVING ENVIRONMENT: Lives with: lives with their spouse Lives in: House/apartment Stairs:  Yes: steps on front and back of house,  however has an elevator at the back, steps to 2nd floor but does not need to go upstairs Has following equipment at home: Environmental consultant - 2 wheeled, Environmental consultant - 4 wheeled, Wheelchair (power), Wheelchair (manual), shower chair, bed side commode, Grab bars, and elevator to access home from outside, hand held shower head  PLOF:  Has a caregiver 3x/week that assists with ADLs, d/t spinal compression fx pt has become more w/c bound in 5-6 months due to    [place] to talk properly; to write and to spell  OBJECTIVE:   HAND DOMINANCE: Right  ADLs: Dressing: Setup w/ UB dressing, wife assists w/ pants, socks, shoes Grooming: Mod I from seated position Eating: reports great difficulty using a knife to cut food, wife frequently cuts foods for him Toileting: Occ difficulty pulling pants back up after toileting, leans forward to complete hygiene Bathing: Supervision/setup  Tub Shower transfers: caregiver assist w/ transfers in/out of shower providing SPV and occ Min A Equipment: Shower seat with back, Grab bars, Walk in shower, bed side  commode, Reacher, and Sock aid  IADLs: Financial management: Reports difficulty with writing a check, math, spelling, but did most of financial management Handwriting: 50% legible and difficulty with spelling that has gotten worse since CVA  MOBILITY STATUS: Needs Assist: Mod I stand pivot from w/c, Supervision for shower transfers  FUNCTIONAL OUTCOME MEASURES: FOTO: 58 on 09/27/21  UPPER EXTREMITY MMT:     MMT Right eval Left eval  Shoulder flexion 4+/5 4+/5  Shoulder abduction    Shoulder adduction    Shoulder extension    Shoulder internal rotation    Shoulder external rotation    Middle trapezius    Lower trapezius    Elbow flexion 4+/5 4+/5  Elbow extension 4+/5 4+/5  Wrist flexion    Wrist extension    Wrist ulnar deviation    Wrist radial deviation    Wrist pronation    Wrist supination    (Blank rows = not tested)  HAND FUNCTION: Grip  strength: Right: 30 lbs; Left: 30 lbs  COORDINATION: 9 Hole Peg test: Right: 1:06.53 sec; Left: 34.53 sec 09/20/21: Right: 56.15 sec  SENSATION: Reports numbness and difficulty maintaining grasp on spoon/fork and writing utensil  -------------------------------------------------------------------------------------------------------------------------------------------------------- (objective measures above completed at initial evaluation unless otherwise dated)  TODAY'S TREATMENT: 10/02/21 Coordination: engaged in small peg board pattern replication with RUE.  Pt demonstrating improvements in ability to pick up small pegs, however requiring intermittent assistance from LUE to assist in manipulating/rotating pegs to place in to place in hole.  Pt demonstrating decreased incorporation of index finger into manipulation of pegs, however with min cues pt demonstrating increased attempts at incorporating.  Pt dropping 3 of 36 pegs and utilizing peg board to reposition hand placement 25% of time.  Removing pegs with increased focus on precision pinch.   Handwriting: engaged in writing out names of foods in alphabetical order with focus on legibility, correct spelling, and word production.  Pt making a list of letters in order and then filling in words as he thinks of them.  Pt initially having difficulty with recalling letters in alphabet post M and then skipping >50% of the words to return to.  Pt utilizing slightly weighted pen for increased proprioceptive input for writing.  Handwriting 90% legible with pt crossing out a few letters during activity.  Discussed utilizing capital letters as needed for increased legibility.     09/27/21 ADL: reports requiring assistance for getting pants on/off but that he feels that he could do it but it requires increased time and effort.  Getting on/off toilet can be challenging.  Pt reports increased difficulty with getting pants and briefs up post toileting.   Performed  FOTO.  Pt continues to report impairments with handwriting and typing and inconsistent impairments impacting functional tasks.  Coordination: picking up variety of small items with increased time and effort.  Pt initially attempting to slide off edge of table.  Pt with increased difficulty with smaller beads and beans compared to stones and coins.  Progressed to in-hand manipulation and translation with pt dropping 1/5 stones when translating from finger tips to palm and 2/5 stones when attempting to translate from palm to finger tips.  Increased challenge to completing in-hand manipulation and translation with coins to then place into coin slot.  Pt demonstrating improved coordination and motor control with coins compared to stones.  Pt verbalizes needing to visually attend to task to "move the coin".  Rotating pen in finger tips and walking fingers up/down pen.  Discussed  structured and functional [place] and in-hand manipulation tasks to continue to address coordination at home.  Provided with printed handouts.    09/25/21 Pipe tree puzzle: replication of pattern with focus on BUE use, sequencing, organization, and problem solving. Pt required initial pattern and demonstration of coding to correctly identify each piece.  Pt able to complete with mild increase in time, however making no errors. Categorizing: provided with list of items and directed to place in categories according to where he would purchase items.  Task facilitating handwriting, thinking skills of organization and sequencing. Pt with letter reversals such as "b, d, and p".  OT then challenged pt to estimate how much money he would spend at each store.  Pt able to complete systematically and able to report rationale for prices as needed.   PATIENT EDUCATION: Ongoing condition-specific education related to therapeutic interventions completed this session Person educated: Patient Education method: Explanation, Demonstration, and  Handouts Education comprehension: verbalized understanding   HOME EXERCISE PROGRAM: Access Code: [email or web address] URL: [email or web address] Date: 09/11/2021 Prepared by: [facility]  Exercises - Putty Squeezes  - 1 x daily - 1 sets - 10 reps - Rolling Putty on Table  - 1 x daily - 1 sets - 10 reps - Thumb Opposition with Putty  - 1 x daily - 1 sets - 10 reps - Key Pinch with Putty  - 1 x daily - 1 sets - 10 reps - Tip Pinch with Putty  - 1 x daily - 1 sets - 10 reps - Removing Marbles from Putty  - 1 x daily - 1 sets - 10 reps  GOALS: Goals reviewed with patient? Yes  SHORT TERM GOALS: Target date: 09/29/2021  Pt will verbalize understanding of adapted strategies and/or potential AE needs to increase ease, safety, and independence w/ LB dressing. Baseline: Goal status: NOT MET - 09/27/21 Pt reports still having difficulty with getting pants up/down and on/off.  Will benefit from simulated practice but have not to this point due to focus on [facility] and handwriting  2.  Pt will be independent with FMC/GMC and strengthening HEP to increase independence with handwriting and cutting of foods. Baseline:  Goal status: MET - 09/27/21  3. Pt will demonstrate improved fine motor coordination for ADLs as evidenced by decreasing 9 hole peg test score for RUE by 5 secs  Baseline: 9 Hole Peg test: Right: 1:06.53 sec; Left: 34.53 sec  Goal status: MET - 56.15 sec   LONG TERM GOALS: Target date: 10/27/2021  Pt will report ability to complete LB dressing with supervision for standing balance and use of AE as needed. Baseline:  Goal status: IN PROGRESS  2.  Pt will demonstrated improved standing balance to stand for 1 min with alternating UE to allow for increased independence with LB dressing/clothing management post toileting. Baseline:  Goal status: IN PROGRESS  3.  Pt will demonstrated improved working memory and recall during check  writing/balancing check book by making 0 errors. Baseline:  Goal status: IN PROGRESS  4.  Pt will demonstrate improved fine motor coordination for ADLs as evidenced by decreasing 9 hole peg test score for RUE by 20 secs Baseline: 9 Hole Peg test: Right: 1:06.53 sec; Left: 34.53 sec Goal status: REVISED - upgraded 09/20/21 due to completing in 56.15 sec   5.  Pt will write a short paragraph with 90% legibility and no significant errors in spelling as evidenced by </= to 3 spelling errors. Baseline:  Goal status: IN PROGRESS   ASSESSMENT:  CLINICAL IMPRESSION: Treatment session with focus on [facility] tasks to continue to address decreased coordination and motor control with handwriting.  Pt continues to report difficulty with handwriting, letter reversals and having to rewrite words to increase legibility or correct for incorrect letters. Reiterated difficulty with word finding and letter reversals with writing pertaining to aphasia vs coordination issue.  PERFORMANCE DEFICITS in functional skills including ADLs, IADLs, coordination, sensation, ROM, strength, pain, FMC, GMC, balance, body mechanics, endurance, decreased knowledge of use of DME, and UE functional use, cognitive skills including attention, problem solving, sequencing, and thought, and psychosocial skills including coping strategies, environmental adaptation, and routines and behaviors.   IMPAIRMENTS are limiting patient from ADLs and IADLs.   COMORBIDITIES may have co-morbidities  that affects occupational performance. Patient will benefit from skilled OT to address above impairments and improve overall function.  PLAN: OT FREQUENCY: 2x/week  OT DURATION: 8 weeks  PLANNED INTERVENTIONS: self care/ADL training, therapeutic exercise, therapeutic activity, neuromuscular re-education, balance training, functional mobility training, moist heat, cryotherapy, patient/family education, cognitive remediation/compensation, psychosocial  skills training, energy conservation, coping strategies training, and DME and/or AE instructions  RECOMMENDED OTHER SERVICES: N/A  CONSULTED AND AGREED WITH PLAN OF CARE: Patient  PLAN FOR NEXT SESSION: review coordination HEP if needed, [place] tasks to address handwriting and other alternating attention tasks, word games/association for aphasia and carryover to spelling. Attempt typing and/or computer mouse program, problem solving LB dressing and getting up/down from toilet   Shalynn Jorstad, [place], OTR/L 10/02/2021, 3:42 PM

## 2021-10-02 NOTE — Therapy (Signed)
OUTPATIENT SPEECH LANGUAGE PATHOLOGY TREATMENT   Patient Name: [name] [name] MRN: [id number] DOB:July 25, 1926, 88 y.o., male Today's Date: 10/02/2021  PCP: [name], MD REFERRING PROVIDER: Alysia Penna, MD   End of Session - 10/02/21 1411     Visit Number 6    Number of Visits 17    Date for SLP Re-Evaluation 10/27/21    SLP Start Time 8469    SLP Stop Time  1445    SLP Time Calculation (min) 40 min    Activity Tolerance Patient tolerated treatment well             Past Medical History:  Diagnosis Date   AAA (abdominal aortic aneurysm) ([place])    Arthritis    Ascending aorta dilatation ([place]) 03/13/2012   Fusiform dilatation of the ascending thoracic aorta discovered during surgery, max diameter 4.2-4.3 cm   BPH (benign prostatic hypertrophy)    Chronic systolic heart failure ([place]) 03/03/2012   Chronic venous insufficiency    GERD (gastroesophageal reflux disease)    Glaucoma    Gout    History of kidney stones    Hyperlipidemia    Hypertension    Obesity (BMI 30-39.9)    Paroxysmal atrial fibrillation ([place]) 02/29/2012   Recurrent paroxysmal, new-onset    PMR (polymyalgia rheumatica) (HCC)    Polymyalgia rheumatica ([place]) 02/06/2019   Pre-diabetes    S/P CABG x 3 03/06/2012   LIMA to LAD, SVG to OM, SVG to RCA, EVH via right thigh   S/P Maze operation for atrial fibrillation 03/06/2012   Complete bilateral lesions set using bipolar radiofrequency and cryothermy ablation with clipping of LA appendage   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   s/p TAVR with a 26 mm Edwards via the left subclavian by Drs Buena Irish and Bartle   Severe aortic stenosis 09/29/2019   Sleep apnea    USES CPAP NIGHTLY   Past Surgical History:  Procedure Laterality Date   ABDOMINAL AORTOGRAM W/LOWER EXTREMITY N/A 10/26/2020   Procedure: ABDOMINAL AORTOGRAM W/LOWER EXTREMITY;  Surgeon: Broadus John, MD;  Location: [facility];  Service: Cardiovascular;   Laterality: N/A;   CARDIAC CATHETERIZATION     CATARACT EXTRACTION, BILATERAL     with lens implants   CHOLECYSTECTOMY N/A 03/24/2012   Procedure: LAPAROSCOPIC CHOLECYSTECTOMY WITH INTRAOPERATIVE CHOLANGIOGRAM;  Surgeon: Zenovia Jarred, MD;  Location: [facility];  Service: General;  Laterality: N/A;   CORONARY ARTERY BYPASS GRAFT N/A 03/06/2012   Procedure: CORONARY ARTERY BYPASS GRAFTING (CABG);  Surgeon: Rexene Alberts, MD;  Location: [facility];  Service: Open Heart Surgery;  Laterality: N/A;   ENDOVEIN HARVEST OF GREATER SAPHENOUS VEIN Right 03/06/2012   Procedure: ENDOVEIN HARVEST OF GREATER SAPHENOUS VEIN;  Surgeon: Rexene Alberts, MD;  Location: [facility];  Service: Open Heart Surgery;  Laterality: Right;   INTRAOPERATIVE TRANSESOPHAGEAL ECHOCARDIOGRAM N/A 03/06/2012   Procedure: INTRAOPERATIVE TRANSESOPHAGEAL ECHOCARDIOGRAM;  Surgeon: Rexene Alberts, MD;  Location: [facility];  Service: Open Heart Surgery;  Laterality: N/A;   IR KYPHO LUMBAR INC FX REDUCE BONE BX UNI/BIL CANNULATION INC/IMAGING  02/23/2020   LEFT HEART CATH Right 03/02/2012   Procedure: LEFT HEART CATH;  Surgeon: Sherren Mocha, MD;  Location[facility];  Service: Cardiovascular;  Laterality: Right;   MAZE N/A 03/06/2012   Procedure: MAZE;  Surgeon: Rexene Alberts, MD;  Location: [facility];  Service: Open Heart Surgery;  Laterality: N/A;   PERIPHERAL VASCULAR INTERVENTION Right 10/26/2020   Procedure: PERIPHERAL VASCULAR  INTERVENTION;  Surgeon: Broadus John, MD;  Location: [facility];  Service: Cardiovascular;  Laterality: Right;   PROSTATECTOMY     partial   RIGHT/LEFT HEART CATH AND CORONARY/GRAFT ANGIOGRAPHY N/A 09/29/2019   Procedure: RIGHT/LEFT HEART CATH AND CORONARY/GRAFT ANGIOGRAPHY;  Surgeon: Adrian Prows, MD;  Location: [facility];  Service: Cardiovascular;  Laterality: N/A;   TEE WITHOUT CARDIOVERSION N/A 11/10/2019   Procedure: TRANSESOPHAGEAL ECHOCARDIOGRAM (TEE);  Surgeon: Burnell Blanks, MD;  Location:  [facility];  Service: Open Heart Surgery;  Laterality: N/A;   TRANSCATHETER AORTIC VALVE REPLACEMENT, TRANSFEMORAL  11/10/2019   Patient Active Problem List   Diagnosis Date Noted   Left middle cerebral artery stroke ([place]) 05/31/2021   Acute CVA (cerebrovascular accident) ([place]) 05/25/2021   DNR (do not resuscitate) 05/25/2021   Osteoporosis 05/05/2021   Gouty arthritis 02/13/2021   Morbid obesity ([place]) 02/13/2021   Abdominal aortic aneurysm without rupture ([place]) 11/22/2020   Acute renal failure syndrome ([place]) 11/22/2020   Low back pain 11/22/2020   Skin tear of right upper arm without complication 02/40/9735   Pressure injury of skin 10/23/2020   Urinary tract infection with hematuria 10/22/2020   Encephalopathy 10/22/2020   AKI (acute kidney injury) ([place])    Overweight (BMI 25.0-29.9) 08/19/2020   Vertebral fracture, osteoporotic ([place]) 08/18/2020   T8 vertebral fracture ([place]) 08/17/2020   Thrombocytopenia ([place]) 08/17/2020   CKD (chronic kidney disease) stage 3, GFR 30-59 ml/min (HCC) 08/17/2020   Elevated troponin 08/17/2020   Pain due to onychomycosis of toenails of both feet 07/27/2020   Callus of heel 07/27/2020   Bifascicular block 11/11/2019   Acute on chronic diastolic heart failure ([place]) 11/10/2019   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   Severe aortic stenosis 09/29/2019   Aortic valve disorder 32/99/2426   Systolic heart failure ([place]) 03/11/2019   Thrombophilia ([place]) 02/26/2019   Muscle pain 02/11/2019   Weakness 02/06/2019   Polymyalgia rheumatica ([place]) 02/06/2019   Shoulder joint pain 02/02/2019   Anemia 01/26/2019   Malignant melanoma of skin ([place]) 01/26/2019   Pain in right leg 01/26/2019   DDD (degenerative disc disease), cervical 05/28/2018   Encounter for general adult medical examination without abnormal findings 11/19/2017   Carotid artery occlusion 07/03/2017   Hearing loss 07/03/2017   Fall 06/19/2017   Injury of upper extremity  06/19/2017   Neck pain 06/19/2017   Headache 06/19/2017   Visual disturbance 06/19/2017   Ganglion of hand 05/08/2017   Dilatation of aorta ([place]) 02/14/2017   Plantar fascial fibromatosis 02/14/2017   Cardiac murmur, unspecified 02/27/2016   Benign neoplasm of colon 08/24/2015   Dementia ([place]) 08/24/2015   Influenza due to unidentified influenza virus with other respiratory manifestations 03/15/2015   Epistaxis 12/01/2014   OSA (obstructive sleep apnea) 11/18/2014   Gastro-esophageal reflux disease with esophagitis 10/29/2014   Angina pectoris ([place]) 05/04/2014   Abnormal feces 07/31/2013   Other specified disorders of gingiva and edentulous alveolar ridge 07/31/2013   Spontaneous ecchymosis 10/10/2012   Thoracic ascending aortic aneurysm ([place]) 09/29/2012   Microscopic hematuria 04/11/2012   Proteinuria 04/11/2012   S/P laparoscopic cholecystectomy 04/09/2012   S/P CABG x 3 03/06/2012   S/P Maze operation for atrial fibrillation 03/06/2012   Paroxysmal atrial fibrillation ([place]) 02/29/2012   Bitten or stung by nonvenomous insect and other nonvenomous arthropods, initial encounter 12/03/2011   Depression screening 10/17/2011   CAD (coronary artery disease), native coronary artery    Hyperlipidemia  Hypertension    Chronic venous insufficiency    Gout    Benign prostatic hyperplasia    Post-traumatic stress disorder 03/13/2011   Pain in left leg 12/08/2010   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   Fatigue 08/08/2010   Retention of urine 04/18/2010   Constipation 07/05/2009   Kidney stone 07/05/2009   ED (erectile dysfunction) of organic origin 12/14/2008   Impaired fasting glucose 12/14/2008   Vitamin B deficiency 12/14/2008    ONSET DATE: 05/25/21   REFERRING DIAG: F62.130 (ICD-10-CM) - Left middle cerebral artery stroke   THERAPY DIAG:  Aphasia  Cognitive communication deficit  Rationale for Evaluation and Treatment Rehabilitation  SUBJECTIVE:   SUBJECTIVE  STATEMENT: "I could tell you the dog ate my homework but..." Pt accompanied by: self  PERTINENT HISTORY:  [name] [name] is a 88 y.o. male with medical history significant of AAA; CAD s/p CABG; chronic systolic CHF; afib s/p Maze, on Xarelto; HTN; HLD; PMR (currently on steroids); pre-diabetes; OSA on CPAP; PAD with lymphedema; and AS s/p TAVR presenting with stroke-like symptoms on 05/25/21. FAmily had a meeting with bad news.  Shortly thereafter, he was trembling on one side with slurred speech.  His episode lasted less than a minute but the speech difficulty persisted. He completed home health PT OT and speech and arrives at OP rehab for his continuation of rehabilitative care.  Inpatient rehab discharge note: "Patient has made excellent gains and has met 5 of 5 long-term goals this admission due to improved motor speech, word finding skills, complex problem solving, and attention. Pt is completing high level cognitive-linguistic tasks with mod I-to-sup A in regards to problem solving, attention, and functional recall. Pt demonstrates improved self-monitoring and self correction of verbal errors when communicating at the phrase/sentence level. Pt continues to exhibit mild higher level word finding, spelling, motor speech production, and alternating attention deficits. Patient and family education is complete and patient to discharge at overall mod I-to-sup A level. Patient's care partner is independent to provide the necessary physical and cognitive assistance at discharge. Pt would continue to benefit from skilled ST services in order to maximize functional independence and reduce burden of care, with continued skilled ST services at discharge."  PAIN:  Are you having pain? Yes: NPRS scale: 3/10 Pain location: lumbar spine Pain description: aching, sore, sharp Aggravating factors: standing/walking Relieving factors: recliner  PATIENT GOALS  Speak intelligently, write with better spelling  ability  OBJECTIVE:   TODAY'S TREATMENT:  10/02/21: Pt engaged in mod complex/complex conversation with SLP today for 20 minutes about sons and daughters and their children and occupations. Pt with anomia with "accountant" and "satellite" but words were generated within 3 seconds extra time. Written language targeted afterwards; pt made errand list with 86% success (6/7), with awareness. Written homework provided for pt. Pt agrees he is appropriate at this time to decr to once/week next week due to progress.   09/27/21: Pt reports no additional social situations since last session but SLP encouraged pt to cont to engage in those situations socially, and to cont to use slower rate and other compensations which he is so successful with in therapy.  SLP targeted verbal expression in mod complex stimuli - with extra time pt self-corrected all responses. The last 5 minutes pt with more frequent self correction necessary. Homework provided for written and spoken responses.   09/25/21: "I was with friends yesterday - we went out for brunch - and I felt it more difficult to speak clearly. I  really had to think about what I have to say." SLP and pt with long (10-minute) discussion on self-advocating in a conversation about his need for slower rate of speech in order to improve accuracy in verbal expression. Pt indicated understanding and agreed that self-advocating would be a good thing for him to do. With three-four sentence response stimuli (verbal) pt used slower rate and errors (approx 1 per sentence) were detected and remediated successfully. SLP provided pt homework for writing at word level, and speaking at sentence/2-sentence level.  Pt named 8 items/60 seconds in a category with independence.  09/18/21: [name] [name] was able to communicate his intended message with success using slowed speech rate, circumlocution, and synonym strategies. He was aware of errors 100% of the time. SLP assisted pt with word level  aphasia (written) with cues for error words and then min-mod cues for letter accuracy. Homework for written aphasia at the word/phrase level was provided.  09/13/21: Assessed pt's writing skills for language ability today. [name] [name] made aphasic errors in all sets of responses (divergent naming tasks). Pt aware of errors 75% of the time. With error words SLP provided "fill in the blank", and pt with correct response 80% of the time. SLP provided him practice with written language accuracy, with instructions to wife to assist if he has difficulty with the same means (fill in the blank).  08/29/21: Discussed evaluation results, probable goals, and discussed pt's need to write more and provide himself more opportunity for practice.   PATIENT EDUCATION: Education details: see above in "today's treatment" Person educated: Patient Education method: Explanation Education comprehension: verbalized understanding and needs further education    PATIENT REPORTED OUTCOME MEASURES (PROM): Communication Participation Item Bank: to be completed next session and Communication Effectiveness Survey: to be completed next session    GOALS: Goals reviewed with patient? Yes  SHORT TERM GOALS: Target date: 10/10/2021  - due to 4 therapy sessions prior to date of 09-26-21  Pt will name average 8 items/60 seconds in simple category important to pt in 3 sessions Baseline:09-25-21, 09-27-21 Goal status: Met  2.  Pt will write to-do lists, errand lists (1-3 word responses) functionally in 3 sessions Baseline: 10-02-21 Goal status: Ongoing  3.  Pt will participate in 8 minutes functional mod complex conversation with modified independence in 2 sessions Baseline: 09-18-21 Goal status: Met    LONG TERM GOALS: Target date: 10/24/2021   Pt will write sentences of 5+ words, functionally, with rare min A in 3 sessions Baseline:  Goal status: Ongoing  2.   Pt will participate in 10 minutes functional mod complex-complex  conversation with modified independence in 3 sessions Baseline:  Goal status: Ongoing   ASSESSMENT:  CLINICAL IMPRESSION: Patient is a 88 y.o. male who was seen today for speech and language therapy following CVA in May 2023. Pt's mod complex verbal expression continues functional with consistent extra time. He is still making occasional errors in written language - SLP providing consistent homework with pt's written langauge at word and phrase levels. SLP extended STG end date due to 4 therapy sessions prior to that date. Pt will likely decr to once a week in the next 1-2 weeks. See TX NOTE.  OBJECTIVE IMPAIRMENTS include expressive language and aphasia. These impairments are limiting patient from ADLs/IADLs and effectively communicating at home and in community. Factors affecting potential to achieve goals and functional outcome are  none . Patient will benefit from skilled SLP services to address above impairments and improve overall function.  REHAB POTENTIAL: Good  PLAN: SLP FREQUENCY: 2x/week  SLP DURATION: 8 weeks  PLANNED INTERVENTIONS: Language facilitation, Environmental controls, Cueing hierachy, Internal/external aids, Oral motor exercises, Functional tasks, Multimodal communication approach, SLP instruction and feedback, Compensatory strategies, and Patient/family education    [facility], [place] 10/02/2021, 11:05 PM

## 2021-10-02 NOTE — Telephone Encounter (Signed)
Left patient a voicemail about new appointment time for 10/3

## 2021-10-03 ENCOUNTER — Inpatient Hospital Stay: Payer: Medicare Other

## 2021-10-03 ENCOUNTER — Encounter: Payer: Self-pay | Admitting: Neurology

## 2021-10-03 ENCOUNTER — Inpatient Hospital Stay: Payer: Medicare Other | Attending: Oncology

## 2021-10-03 ENCOUNTER — Ambulatory Visit (INDEPENDENT_AMBULATORY_CARE_PROVIDER_SITE_OTHER): Payer: Medicare Other | Admitting: Neurology

## 2021-10-03 VITALS — BP 146/71 | HR 63 | Ht 74.0 in

## 2021-10-03 DIAGNOSIS — N189 Chronic kidney disease, unspecified: Secondary | ICD-10-CM | POA: Insufficient documentation

## 2021-10-03 DIAGNOSIS — I482 Chronic atrial fibrillation, unspecified: Secondary | ICD-10-CM | POA: Diagnosis not present

## 2021-10-03 DIAGNOSIS — I63412 Cerebral infarction due to embolism of left middle cerebral artery: Secondary | ICD-10-CM

## 2021-10-03 DIAGNOSIS — Z79899 Other long term (current) drug therapy: Secondary | ICD-10-CM | POA: Insufficient documentation

## 2021-10-03 DIAGNOSIS — I69320 Aphasia following cerebral infarction: Secondary | ICD-10-CM

## 2021-10-03 DIAGNOSIS — D696 Thrombocytopenia, unspecified: Secondary | ICD-10-CM | POA: Insufficient documentation

## 2021-10-03 DIAGNOSIS — D631 Anemia in chronic kidney disease: Secondary | ICD-10-CM | POA: Insufficient documentation

## 2021-10-03 NOTE — Patient Instructions (Signed)
I had a long d/w patient and his wife about his recent embolic stroke, chronic atrial fibrillation risk for recurrent stroke/TIAs, personally independently reviewed imaging studies and stroke evaluation results and answered questions.Continue Eliquis (apixaban) daily  for secondary stroke prevention and maintain strict control of hypertension with blood pressure goal below 130/90, diabetes with hemoglobin A1c goal below 6.5% and lipids with LDL cholesterol goal below 70 mg/dL. I also advised the patient to eat a healthy diet with plenty of whole grains, cereals, fruits and vegetables, exercise regularly and maintain ideal body weight .  Patient may also consider possible participation in [place] AF trial if interested and will be given information to review at home and decide.  Followup in the future with me in 6 months or call earlier if necessary. Stroke Prevention Some medical conditions and behaviors can lead to a higher chance of having a stroke. You can help prevent a stroke by eating healthy, exercising, not smoking, and managing any medical conditions you have. Stroke is a leading cause of functional impairment. Primary prevention is particularly important because a majority of strokes are first-time events. Stroke changes the lives of not only those who experience a stroke but also their family and other caregivers. How can this condition affect me? A stroke is a medical emergency and should be treated right away. A stroke can lead to brain damage and can sometimes be life-threatening. If a person gets medical treatment right away, there is a better chance of surviving and recovering from a stroke. What can increase my risk? The following medical conditions may increase your risk of a stroke: Cardiovascular disease. High blood pressure (hypertension). Diabetes. High cholesterol. Sickle cell disease. Blood clotting disorders (hypercoagulable state). Obesity. Sleep disorders (obstructive sleep  apnea). Other risk factors include: Being older than age 16. Having a history of blood clots, stroke, or mini-stroke (transient ischemic attack, TIA). Genetic factors, such as race, ethnicity, or a family history of stroke. Smoking cigarettes or using other tobacco products. Taking birth control pills, especially if you also use tobacco. Heavy use of alcohol or drugs, especially cocaine and methamphetamine. Physical inactivity. What actions can I take to prevent this? Manage your health conditions High cholesterol levels. Eating a healthy diet is important for preventing high cholesterol. If cholesterol cannot be managed through diet alone, you may need to take medicines. Take any prescribed medicines to control your cholesterol as told by your health care provider. Hypertension. To reduce your risk of stroke, try to keep your blood pressure below 130/80. Eating a healthy diet and exercising regularly are important for controlling blood pressure. If these steps are not enough to manage your blood pressure, you may need to take medicines. Take any prescribed medicines to control hypertension as told by your health care provider. Ask your health care provider if you should monitor your blood pressure at home. Have your blood pressure checked every year, even if your blood pressure is normal. Blood pressure increases with age and some medical conditions. Diabetes. Eating a healthy diet and exercising regularly are important parts of managing your blood sugar (glucose). If your blood sugar cannot be managed through diet and exercise, you may need to take medicines. Take any prescribed medicines to control your diabetes as told by your health care provider. Get evaluated for obstructive sleep apnea. Talk to your health care provider about getting a sleep evaluation if you snore a lot or have excessive sleepiness. Make sure that any other medical conditions you have, such as  atrial fibrillation or  atherosclerosis, are managed. Nutrition Follow instructions from your health care provider about what to eat or drink to help manage your health condition. These instructions may include: Reducing your daily calorie intake. Limiting how much salt (sodium) you use to 1,500 milligrams (mg) each day. Using only healthy fats for cooking, such as olive oil, canola oil, or sunflower oil. Eating healthy foods. You can do this by: Choosing foods that are high in fiber, such as whole grains, and fresh fruits and vegetables. Eating at least 5 servings of fruits and vegetables a day. Try to fill one-half of your plate with fruits and vegetables at each meal. Choosing lean protein foods, such as lean cuts of meat, poultry without skin, fish, tofu, beans, and nuts. Eating low-fat dairy products. Avoiding foods that are high in sodium. This can help lower blood pressure. Avoiding foods that have saturated fat, trans fat, and cholesterol. This can help prevent high cholesterol. Avoiding processed and prepared foods. Counting your daily carbohydrate intake.  Lifestyle If you drink alcohol: Limit how much you have to: 0-1 drink a day for women who are not pregnant. 0-2 drinks a day for men. Know how much alcohol is in your drink. In the [place] one drink equals one 12 oz bottle of beer (345m), one 5 oz glass of wine (1418m, or one 1 oz glass of hard liquor (4453m Do not use any products that contain nicotine or tobacco. These products include cigarettes, chewing tobacco, and vaping devices, such as e-cigarettes. If you need help quitting, ask your health care provider. Avoid secondhand smoke. Do not use drugs. Activity  Try to stay at a healthy weight. Get at least 30 minutes of exercise on most days, such as: Fast walking. Biking. Swimming. Medicines Take over-the-counter and prescription medicines only as told by your health care provider. Aspirin or blood thinners (antiplatelets or  anticoagulants) may be recommended to reduce your risk of forming blood clots that can lead to stroke. Avoid taking birth control pills. Talk to your health care provider about the risks of taking birth control pills if: You are over 35 55ars old. You smoke. You get very bad headaches. You have had a blood clot. Where to find more information American Stroke Association: www.strokeassociation.org Get help right away if: You or a loved one has any symptoms of a stroke. "BE FAST" is an easy way to remember the main warning signs of a stroke: B - Balance. Signs are dizziness, sudden trouble walking, or loss of balance. E - Eyes. Signs are trouble seeing or a sudden change in vision. F - Face. Signs are sudden weakness or numbness of the face, or the face or eyelid drooping on one side. A - Arms. Signs are weakness or numbness in an arm. This happens suddenly and usually on one side of the body. S - Speech. Signs are sudden trouble speaking, slurred speech, or trouble understanding what people say. T - Time. Time to call emergency services. Write down what time symptoms started. You or a loved one has other signs of a stroke, such as: A sudden, severe headache with no known cause. Nausea or vomiting. Seizure. These symptoms may represent a serious problem that is an emergency. Do not wait to see if the symptoms will go away. Get medical help right away. Call your local emergency services (911 in the [place] Do not drive yourself to the hospital. Summary You can help to prevent a stroke by eating healthy, exercising, not  smoking, limiting alcohol intake, and managing any medical conditions you may have. Do not use any products that contain nicotine or tobacco. These include cigarettes, chewing tobacco, and vaping devices, such as e-cigarettes. If you need help quitting, ask your health care provider. Remember "BE FAST" for warning signs of a stroke. Get help right away if you or a loved one has any  of these signs. This information is not intended to replace advice given to you by your health care provider. Make sure you discuss any questions you have with your health care provider. Document Revised: 07/20/2019 Document Reviewed: 07/20/2019 Elsevier Patient Education  [place].

## 2021-10-03 NOTE — Therapy (Signed)
OUTPATIENT PHYSICAL THERAPY NEURO TREATMENT   Patient Name: [name] [name] MRN: [id number] DOB:1926/03/10, 88 y.o., male Today's Date: 10/04/2021   PCP: [name], MD REFERRING PROVIDER: Charlett Blake, MD    PT End of Session - 10/04/21 1445     Visit Number 8    Number of Visits 16    Date for PT Re-Evaluation 11/07/21    Authorization Type BCBS Medicare    Authorization Time Period no VL, no auth    PT Start Time 1402    PT Stop Time 1443    PT Time Calculation (min) 41 min    Equipment Utilized During Treatment Gait belt    Activity Tolerance Patient tolerated treatment well;Patient limited by pain    Behavior During Therapy WFL for tasks assessed/performed                   Past Medical History:  Diagnosis Date   AAA (abdominal aortic aneurysm) ([place])    Arthritis    Ascending aorta dilatation ([place]) 03/13/2012   Fusiform dilatation of the ascending thoracic aorta discovered during surgery, max diameter 4.2-4.3 cm   BPH (benign prostatic hypertrophy)    Chronic systolic heart failure ([place]) 03/03/2012   Chronic venous insufficiency    GERD (gastroesophageal reflux disease)    Glaucoma    Gout    History of kidney stones    Hyperlipidemia    Hypertension    Obesity (BMI 30-39.9)    Paroxysmal atrial fibrillation ([place]) 02/29/2012   Recurrent paroxysmal, new-onset    PMR (polymyalgia rheumatica) (HCC)    Polymyalgia rheumatica ([place]) 02/06/2019   Pre-diabetes    S/P CABG x 3 03/06/2012   LIMA to LAD, SVG to OM, SVG to RCA, EVH via right thigh   S/P Maze operation for atrial fibrillation 03/06/2012   Complete bilateral lesions set using bipolar radiofrequency and cryothermy ablation with clipping of LA appendage   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   s/p TAVR with a 26 mm Edwards via the left subclavian by Drs Buena Irish and Bartle   Severe aortic stenosis 09/29/2019   Sleep apnea    USES CPAP NIGHTLY   Past Surgical  History:  Procedure Laterality Date   ABDOMINAL AORTOGRAM W/LOWER EXTREMITY N/A 10/26/2020   Procedure: ABDOMINAL AORTOGRAM W/LOWER EXTREMITY;  Surgeon: Broadus John, MD;  Location: [place] [facility];  Service: Cardiovascular;  Laterality: N/A;   CARDIAC CATHETERIZATION     CATARACT EXTRACTION, BILATERAL     with lens implants   CHOLECYSTECTOMY N/A 03/24/2012   Procedure: LAPAROSCOPIC CHOLECYSTECTOMY WITH INTRAOPERATIVE CHOLANGIOGRAM;  Surgeon: Zenovia Jarred, MD;  Location: [place];  Service: General;  Laterality: N/A;   CORONARY ARTERY BYPASS GRAFT N/A 03/06/2012   Procedure: CORONARY ARTERY BYPASS GRAFTING (CABG);  Surgeon: Rexene Alberts, MD;  Location: [place];  Service: Open Heart Surgery;  Laterality: N/A;   ENDOVEIN HARVEST OF GREATER SAPHENOUS VEIN Right 03/06/2012   Procedure: ENDOVEIN HARVEST OF GREATER SAPHENOUS VEIN;  Surgeon: Rexene Alberts, MD;  Location: [place];  Service: Open Heart Surgery;  Laterality: Right;   INTRAOPERATIVE TRANSESOPHAGEAL ECHOCARDIOGRAM N/A 03/06/2012   Procedure: INTRAOPERATIVE TRANSESOPHAGEAL ECHOCARDIOGRAM;  Surgeon: Rexene Alberts, MD;  Location: [place];  Service: Open Heart Surgery;  Laterality: N/A;   IR KYPHO LUMBAR INC FX REDUCE BONE BX UNI/BIL CANNULATION INC/IMAGING  02/23/2020   LEFT HEART CATH Right 03/02/2012   Procedure: LEFT HEART CATH;  Surgeon: Sherren Mocha, MD;  Location:  [place] [facility];  Service: Cardiovascular;  Laterality: Right;   MAZE N/A 03/06/2012   Procedure: MAZE;  Surgeon: Rexene Alberts, MD;  Location: [place];  Service: Open Heart Surgery;  Laterality: N/A;   PERIPHERAL VASCULAR INTERVENTION Right 10/26/2020   Procedure: PERIPHERAL VASCULAR INTERVENTION;  Surgeon: Broadus John, MD;  Location: [place] [facility];  Service: Cardiovascular;  Laterality: Right;   PROSTATECTOMY     partial   RIGHT/LEFT HEART CATH AND CORONARY/GRAFT ANGIOGRAPHY N/A 09/29/2019   Procedure: RIGHT/LEFT HEART CATH AND CORONARY/GRAFT ANGIOGRAPHY;  Surgeon:  Adrian Prows, MD;  Location: [facility];  Service: Cardiovascular;  Laterality: N/A;   TEE WITHOUT CARDIOVERSION N/A 11/10/2019   Procedure: TRANSESOPHAGEAL ECHOCARDIOGRAM (TEE);  Surgeon: Burnell Blanks, MD;  Location: [place];  Service: Open Heart Surgery;  Laterality: N/A;   TRANSCATHETER AORTIC VALVE REPLACEMENT, TRANSFEMORAL  11/10/2019   Patient Active Problem List   Diagnosis Date Noted   Left middle cerebral artery stroke ([place]) 05/31/2021   Acute CVA (cerebrovascular accident) ([place]) 05/25/2021   DNR (do not resuscitate) 05/25/2021   Osteoporosis 05/05/2021   Gouty arthritis 02/13/2021   Morbid obesity ([place]) 02/13/2021   Abdominal aortic aneurysm without rupture ([place]) 11/22/2020   Acute renal failure syndrome ([place]) 11/22/2020   Low back pain 11/22/2020   Skin tear of right upper arm without complication 28/00/3491   Pressure injury of skin 10/23/2020   Urinary tract infection with hematuria 10/22/2020   Encephalopathy 10/22/2020   AKI (acute kidney injury) ([place])    Overweight (BMI 25.0-29.9) 08/19/2020   Vertebral fracture, osteoporotic ([place]) 08/18/2020   T8 vertebral fracture ([place]) 08/17/2020   Thrombocytopenia ([place]) 08/17/2020   CKD (chronic kidney disease) stage 3, GFR 30-59 ml/min (HCC) 08/17/2020   Elevated troponin 08/17/2020   Pain due to onychomycosis of toenails of both feet 07/27/2020   Callus of heel 07/27/2020   Bifascicular block 11/11/2019   Acute on chronic diastolic heart failure ([place]) 11/10/2019   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   Severe aortic stenosis 09/29/2019   Aortic valve disorder 79/15/0569   Systolic heart failure ([place]) 03/11/2019   Thrombophilia ([place]) 02/26/2019   Muscle pain 02/11/2019   Weakness 02/06/2019   Polymyalgia rheumatica ([place]) 02/06/2019   Shoulder joint pain 02/02/2019   Anemia 01/26/2019   Malignant melanoma of skin ([place]) 01/26/2019   Pain in right leg 01/26/2019   DDD (degenerative  disc disease), cervical 05/28/2018   Encounter for general adult medical examination without abnormal findings 11/19/2017   Carotid artery occlusion 07/03/2017   Hearing loss 07/03/2017   Fall 06/19/2017   Injury of upper extremity 06/19/2017   Neck pain 06/19/2017   Headache 06/19/2017   Visual disturbance 06/19/2017   Ganglion of hand 05/08/2017   Dilatation of aorta ([place]) 02/14/2017   Plantar fascial fibromatosis 02/14/2017   Cardiac murmur, unspecified 02/27/2016   Benign neoplasm of colon 08/24/2015   Dementia ([place]) 08/24/2015   Influenza due to unidentified influenza virus with other respiratory manifestations 03/15/2015   Epistaxis 12/01/2014   OSA (obstructive sleep apnea) 11/18/2014   Gastro-esophageal reflux disease with esophagitis 10/29/2014   Angina pectoris ([place]) 05/04/2014   Abnormal feces 07/31/2013   Other specified disorders of gingiva and edentulous alveolar ridge 07/31/2013   Spontaneous ecchymosis 10/10/2012   Thoracic ascending aortic aneurysm ([place]) 09/29/2012   Microscopic hematuria 04/11/2012   Proteinuria 04/11/2012   S/P laparoscopic cholecystectomy 04/09/2012   S/P CABG x 3 03/06/2012   S/P Maze  operation for atrial fibrillation 03/06/2012   Paroxysmal atrial fibrillation ([place]) 02/29/2012   Bitten or stung by nonvenomous insect and other nonvenomous arthropods, initial encounter 12/03/2011   Depression screening 10/17/2011   CAD (coronary artery disease), native coronary artery    Hyperlipidemia    Hypertension    Chronic venous insufficiency    Gout    Benign prostatic hyperplasia    Post-traumatic stress disorder 03/13/2011   Pain in left leg 12/08/2010   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   Fatigue 08/08/2010   Retention of urine 04/18/2010   Constipation 07/05/2009   Kidney stone 07/05/2009   ED (erectile dysfunction) of organic origin 12/14/2008   Impaired fasting glucose 12/14/2008   Vitamin B deficiency 12/14/2008    ONSET DATE:  05/31/21  REFERRING DIAG: B34.356 (ICD-10-CM) - Left middle cerebral artery stroke (HCC)   THERAPY DIAG:  Muscle weakness (generalized)  Unsteadiness on feet  Other abnormalities of gait and mobility  Difficulty in walking, not elsewhere classified  Rationale for Evaluation and Treatment Rehabilitation  SUBJECTIVE:                                                                                                                                                                                              SUBJECTIVE STATEMENT: Went to the wound center this morning- reports slow improvements. Feels like e-stim is improving tolerance for activities in-session.   Pt accompanied by: self  PERTINENT HISTORY: medical history significant of AAA; CAD s/p CABG; chronic systolic CHF; afib s/p Maze, on Xarelto; HTN; HLD; PMR (currently on steroids); pre-diabetes; OSA on CPAP; PAD with lymphedema; and AS s/p TAVR   PAIN:  Are you having pain? Yes: NPRS scale: 3/10 Pain location: lumbar spine Pain description: aching, sore, sharp Aggravating factors: standing/walking Relieving factors: recliner, hot pack, stretching  PRECAUTIONS: Fall, pressure sores bilateral calcaneus right > left  PATIENT GOALS be able to stand unsupported for a time to perform ADL, walking if possible  OBJECTIVE:     TODAY'S TREATMENT: 10/04/21 Activity Comments  TENS to B lumbar paraspinals while performing sitting and standing exercises 150hz , intensity to tolerance (3.25), 30 min  Added MHP to LB for pain relief x20 min Pt noted relief  STS pushing off B armrests x5 Min A and using II bars for balance upon standing; cueing to reach back for armrests upon sitting down to decrease reliance on II bars   standing II bars: side to side wt shift 10x March 10x turning to look over the shoulder 2x6 alt UE raise 8x, 2x ant/pos wt shift 10x  CGA-min A and B UE support on II  bars. Intermittent blocking R knee d/t shaking   Alt LAQ 2# 20x march 2#  20x HS curl red TB 10x  Cues for core and avoiding valsalva     HOME EXERCISE PROGRAM Last updated: 09/18/21 Access Code: [email or web address] URL: [email or web address] Date: 09/18/2021 Prepared by: [facility]  Exercises - Seated Heel Raise  - 1 x daily - 5 x weekly - 2 sets - 10 reps - Seated Toe Raise  - 1 x daily - 5 x weekly - 2 sets - 10 reps - Seated March  - 1 x daily - 5 x weekly - 2 sets - 10 reps - Seated Long Arc Quad  - 1 x daily - 5 x weekly - 2 sets - 10 reps - Seated Hip Adduction Squeeze with Ball  - 1 x daily - 5 x weekly - 2 sets - 10 reps - Row with Anchored Resistance on Swiss Ball  - 1 x daily - 5 x weekly - 2 sets - 10 reps - Seated Shoulder Extension and Scapular Retraction with Resistance on Swiss Ball  - 1 x daily - 5 x weekly - 2 sets - 10 reps    Below measures were taken at time of initial evaluation unless otherwise specified:   DIAGNOSTIC FINDINGS: IMPRESSION: Small areas of acute infarction in the right occipital lobe and left frontal lobe over the convexity. Mild to moderate acute infarct left operculum. Possible cerebral emboli.   Atrophy and mild chronic microvascular ischemia  COGNITION: Overall cognitive status: Within functional limits for tasks assessed Patient's historical recall is not very accurate, he reports his back injury presented x 9 months ago, whereas medical record notes encounter in 12/2019-01/2020 regarding L1 compression fx   SENSATION: Not tested  COORDINATION: Alt. Foot taps impaired due to dorsiflexion weakness  EDEMA:  Bilateral LE, wearing compression stockings. Uses lymphedema pump  MUSCLE TONE: DNT    POSTURE: rounded shoulders  LOWER EXTREMITY ROM:     Passive  Right Eval Left Eval  Hip flexion    Hip extension    Hip abduction    Hip adduction    Hip internal rotation    Hip external rotation    Knee flexion    Knee extension     Ankle dorsiflexion -10 -10  Ankle plantarflexion    Ankle inversion    Ankle eversion     (Blank rows = not tested)  LOWER EXTREMITY MMT:    MMT Right Eval Left Eval  Hip flexion 3 3  Hip extension    Hip abduction 2+ 2+  Hip adduction 3 3  Hip internal rotation    Hip external rotation    Knee flexion 3+ 3+  Knee extension 4- 4-  Ankle dorsiflexion 0 0  Ankle plantarflexion    Ankle inversion    Ankle eversion    (Blank rows = not tested)  BED MOBILITY:  Sit to supine Min A and Mod A Supine to sit Min A Pt notes his bed at home has assist rails  TRANSFERS: Assistive device utilized: Wheelchair (manual)  Sit to stand: CGA and Min A Stand to sit: CGA and Min A Chair to chair: CGA and Min A Floor:  DNT   W/C mobility Independent level surfaces  STAIRS:  DNT--pt has elevator to enter/exit home  GAIT: DNT due to safety concerns  FUNCTIONAL TESTs:  5 times sit to stand: unable, requires BUE and assist Static standing: dependent on  BUE via RW (supported standing) Straight Leg Raise: 20+ degrees extensor lag  PATIENT SURVEYS:  FOTO to be assessed     PATIENT EDUCATION: Education details: assessment findings Person educated: Patient Education method: Explanation Education comprehension: verbalized understanding   HOME EXERCISE PROGRAM: To be initiated, demonstration/rehearsal of hip add isometric    GOALS: Goals reviewed with patient? Yes  SHORT TERM GOALS: Target date: 09/26/2021  Patient will be independent in HEP to improve functional outcomes Baseline: Goal status: GOAL MET, 09/27/2021  2.  Patient to demonstrate sit to supine w/ supervision using adaptive equipment to reduce level of assist from caregiver Baseline: Min-mod A for BLE to clear EOB Goal status: per report 09/27/2021-NOT MET    LONG TERM GOALS: Target date: 10/24/2021  Demonstrate improved hip abduction strength to 3+/5 to improve standing stability Baseline:  2+/5 Goal status: IN PROGRESS  2.  Manifest improved BLE strength as evidenced by ability to perform 5xSTS test in 30 sec using BUE Baseline: unable Goal status: IN PROGRESS  3.  Demonstrate static standing balance of Fair (no UE support) x 15 sec to improve safety with ADL performance Baseline: unable, dependent on BUE (Fair-) Goal status: IN PROGRESS  4.  Demonstrate transfers w/ set-up assist to increase independence and safety with mobility Baseline: CGA-min A Goal status: IN PROGRESS   ASSESSMENT:  CLINICAL IMPRESSION:   Patient arrived to session with report of improved activity tolerance with TENS. Patient performed transfers with min A and with use of TENS and MHP to LB to address pain levels. Worked on addressing hand placement for decreased reliance on UEs. Patient was able to tolerate multiple standing exercises despite c/o LBP. Frequent sitting rest breaks required d/t LBP. Improved muscle control evident with resisted seated exercises today. Patient tolerated session well and reported small increase in LBP to 4/10 at end of session.    OBJECTIVE IMPAIRMENTS cardiopulmonary status limiting activity, decreased activity tolerance, decreased balance, decreased knowledge of use of DME, decreased mobility, difficulty walking, decreased ROM, decreased strength, impaired perceived functional ability, and pain.   ACTIVITY LIMITATIONS lifting, bending, standing, squatting, transfers, bed mobility, toileting, dressing, and locomotion level  PARTICIPATION LIMITATIONS: meal prep, cleaning, community activity, yard work, and home mgmt  PERSONAL FACTORS Age, Time since onset of injury/illness/exacerbation, and 3+ comorbidities: see PMH  are also affecting patient's functional outcome.   REHAB POTENTIAL: Good  CLINICAL DECISION MAKING: Evolving/moderate complexity  EVALUATION COMPLEXITY: Moderate  PLAN: PT FREQUENCY: 2x/week  PT DURATION: 8 weeks  PLANNED INTERVENTIONS:  Therapeutic exercises, Therapeutic activity, Neuromuscular re-education, Balance training, Gait training, Patient/Family education, Self Care, Joint mobilization, Stair training, Vestibular training, Canalith repositioning, Orthotic/Fit training, DME instructions, Aquatic Therapy, Dry Needling, Electrical stimulation, Wheelchair mobility training, Spinal mobilization, Cryotherapy, Moist heat, Taping, Ionotophoresis 4mg /ml Dexamethasone, and Manual therapy  PLAN FOR NEXT SESSION: seated pelvic tilts/core stability, try e-stim to LB while performing standing activities to reduce pain; modalities to address LBP; nustep, LE strengthening, transfers   Janene Harvey, PT, DPT 10/04/21 2:47 PM  [facility] [address] [place], [place] [address] Phone # [phone] Fax # [phone]

## 2021-10-03 NOTE — Progress Notes (Unsigned)
[facility] [address] [place]. [place] [address] [phone]       OFFICE FOLLOW-UP NOTE  [name] Date of Birth:  03-27-1926 Medical Record Number:  [id number]   HPI: [name] is a 88 year old Caucasian male seen today for initial office follow-up visit following hospital consultation for stroke in May 2023.  He is accompanied by his wife today.  History is obtained from them and review of electronic medical records and I personally reviewed pertinent available imaging films in PACS.  He has past medical history of hypertension, hyperlipidemia, obesity, sleep apnea, paroxysmal A-fib, polymyalgia, prediabetes, gastroesophageal disease, chronic systolic heart failure, benign prostatic hypertrophy.  Patient presented on 05/25/2021 with sudden onset of speech difficulties and right-sided weakness.  Daily.  Fluctuated a bit.  Patient had a history of paroxysmal A-fib and was on Xarelto.  He was having some chronic anemia with unclear source of blood loss.  He was transfused 2 units of blood 2 weeks prior to admission and was scheduled to have iron and EPO infusion on the day presented with acute stroke.  MRI scan showed small areas of acute infarction in the right occipital and left frontal lobe and left frontal operculum.  CT angiogram showed moderate stenosis of the left V4 vertebral artery and right paraclinoid ICA.  2D echo showed ejection fraction of 65 to 70% with left ventricular hypertrophy.  Sapien prosthetic ( TAVR)aortic valve.  LDL cholesterol 122 mg percent.  Hemoglobin A1c 5.8.  Patient was on Xarelto which was switched to Eliquis due to failure.  Patient states is done well since discharge.  His right l sided weakness has improved.  He still has some occasional word substitution and paraphasic errors along letter and has trouble writing.  He is tolerating Eliquis well without bleeding and only minor bruising.  He is unable to tandem walk because of chronic back  pain and he has been in the wheelchair for the last 2 years.  He also has baseline mild cognitive impairment for which she has been on galantamine and Namenda.  He states this is at baseline and has not gotten worse since his growth.  He is currently participating in outpatient physical occupational and speech therapy he mostly ambulates mostly using his electric wheelchair.  He can assist with transfers for bathing and using toilet.  ROS:   14 system review of systems is positive for back pain, difficulty walking, speech difficulty, memory loss and all other systems negative  PMH:  Past Medical History:  Diagnosis Date   AAA (abdominal aortic aneurysm) (HCC)    Arthritis    Ascending aorta dilatation ([place]) 03/13/2012   Fusiform dilatation of the ascending thoracic aorta discovered during surgery, max diameter 4.2-4.3 cm   BPH (benign prostatic hypertrophy)    Chronic systolic heart failure ([place]) 03/03/2012   Chronic venous insufficiency    GERD (gastroesophageal reflux disease)    Glaucoma    Gout    History of kidney stones    Hyperlipidemia    Hypertension    Obesity (BMI 30-39.9)    Paroxysmal atrial fibrillation (HCC) 02/29/2012   Recurrent paroxysmal, new-onset    PMR (polymyalgia rheumatica) (HCC)    Polymyalgia rheumatica ([place]) 02/06/2019   Pre-diabetes    S/P CABG x 3 03/06/2012   LIMA to LAD, SVG to OM, SVG to RCA, EVH via right thigh   S/P Maze operation for atrial fibrillation 03/06/2012   Complete bilateral lesions set using bipolar radiofrequency and cryothermy  ablation with clipping of LA appendage   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   s/p TAVR with a 26 mm Edwards via the left subclavian by Drs Buena Irish and Bartle   Severe aortic stenosis 09/29/2019   Sleep apnea    USES CPAP NIGHTLY    Social History:  Social History   Socioeconomic History   Marital status: Married    Spouse name: Not on file   Number of children: 4   Years of  education: Not on file   Highest education level: Not on file  Occupational History   Occupation: retired    Comment: [employer]  Tobacco Use   Smoking status: Former    Packs/day: 0.50    Years: 15.00    Total pack years: 7.50    Types: Cigarettes    Quit date: 11/18/1962    Years since quitting: 58.9    Passive exposure: Never   Smokeless tobacco: Never  Vaping Use   Vaping Use: Never used  Substance and Sexual Activity   Alcohol use: Yes    Alcohol/week: 2.0 standard drinks of alcohol    Types: 2 Shots of liquor per week    Comment: daily, not in last few days   Drug use: Never   Sexual activity: Yes  Other Topics Concern   Not on file  Social History Narrative   Lives with wife in [place].     Very active physically without significant limitations.    Exercises regularly   Social Determinants of Health   Financial Resource Strain: Not on file  Food Insecurity: Not on file  Transportation Needs: Not on file  Physical Activity: Not on file  Stress: Not on file  Social Connections: Not on file  Intimate Partner Violence: Not on file    Medications:   Current Outpatient Medications on File Prior to Visit  Medication Sig Dispense Refill   acetaminophen (TYLENOL) 325 MG tablet Take 2 tablets (650 mg total) by mouth every 4 (four) hours as needed for mild pain (or temp > 37.5 C (99.5 F)).     Acetaminophen 500 MG capsule 2-3 capsules as needed Orally every 6 hrs     amiodarone (PACERONE) 200 MG tablet Take 1 tablet (200 mg total) by mouth daily. 30 tablet 0   amLODipine (NORVASC) 10 MG tablet 1 tablet Orally Once a day     apixaban (ELIQUIS) 2.5 MG TABS tablet take one twice a day Orally twice a day for 90 days     aspirin EC 81 MG tablet      atorvastatin (LIPITOR) 40 MG tablet 1 tablet Orally Once a day     cadexomer iodine (IODOSORB) 0.9 % gel Apply 1 application. topically every other day as needed for wound care.     cholecalciferol (VITAMIN D3) 25 MCG  (1000 UNIT) tablet Take 1 tablet (1,000 Units total) by mouth daily. 30 tablet 0   collagenase (SANTYL) 250 UNIT/GM ointment      Cyanocobalamin (VITAMIN B-12) 5000 MCG TBDP Take 5,000 mcg by mouth daily. 30 tablet 0   denosumab (PROLIA) 60 MG/ML SOSY injection Inject 60 mg into the skin every 6 (six) months.     docusate sodium (COLACE) 100 MG capsule Take 1 capsule (100 mg total) by mouth 2 (two) times daily. (Patient taking differently: Take 100 mg by mouth every evening.)     EPINEPHrine 0.3 mg/0.3 mL IJ SOAJ injection as directed Injection     epoetin alfa-epbx (RETACRIT) 2000  UNIT/ML injection ? dose as directed per hematology Injection every 3 weeks.     ezetimibe (ZETIA) 10 MG tablet Take 1 tablet (10 mg total) by mouth daily. 30 tablet 0   finasteride (PROSCAR) 5 MG tablet Take 1 tablet (5 mg total) by mouth daily. 30 tablet 0   fish oil-omega-3 fatty acids 1000 MG capsule Take 1 capsule (1 g total) by mouth daily. 30 capsule 0   fluticasone (FLONASE) 50 MCG/ACT nasal spray as needed.     furosemide (LASIX) 40 MG tablet 1 tablet Orally Twice a day     gabapentin (NEURONTIN) 100 MG capsule Take 2 capsules by mouth in the morning, 1 capsule in the evening, and 2 capsules at bedtime. 90 capsule 0   galantamine (RAZADYNE ER) 8 MG 24 hr capsule Take 1 capsule (8 mg total) by mouth daily with breakfast. 30 capsule 0   isosorbide mononitrate (IMDUR) 60 MG 24 hr tablet Take 1 tablet (60 mg total) by mouth daily. 30 tablet 0   leflunomide (ARAVA) 10 MG tablet      leflunomide (ARAVA) 20 MG tablet Take 1/2 tablets (10 mg total) by mouth daily. 30 tablet 0   Magnesium 250 MG TABS Take 1 tablet (250 mg total) by mouth daily. 30 tablet 0   memantine (NAMENDA) 10 MG tablet Take 1 tablet (10 mg total) by mouth 2 (two) times daily. 60 tablet 0   memantine (NAMENDA) 5 MG tablet      metoprolol succinate (TOPROL-XL) 25 MG 24 hr tablet      naftifine (NAFTIN) 1 % cream Apply 1 application  topically daily  as needed (fungal skin infection).     nitroGLYCERIN (NITROSTAT) 0.4 MG SL tablet Place 1 tablet (0.4 mg total) under the tongue every 5 (five) minutes x 3 doses as needed for chest pain. 30 tablet 3   pantoprazole (PROTONIX) 40 MG tablet 1 tablet EVERY day Oral Once a day for 90 days     polyethylene glycol (MIRALAX) 17 g packet 1 packet mixed with 8 ounces of fluid Orally Once a day for 30 day(s)     potassium chloride (KLOR-CON M) 10 MEQ tablet Take 1 tablet by mouth everyday with furosemide 30 tablet 0   predniSONE (DELTASONE) 1 MG tablet Take 3 tablets (3 mg total) by mouth daily with breakfast. 30 tablet 0   PREDNISONE PO Take 1 tablet by mouth daily.     Psyllium Husk POWD Take 1 Scoop by mouth daily.      tamsulosin (FLOMAX) 0.4 MG CAPS capsule Take 1 capsule (0.4 mg total) by mouth daily after supper. 30 capsule 0   torsemide (DEMADEX) 20 MG tablet Take 2 tablets (40 mg total) by mouth daily. 60 tablet 0   traMADol (ULTRAM) 50 MG tablet Take 1/2 tablets (25 mg total) by mouth every 6 (six) hours as needed for moderate pain. 30 tablet 0   triamcinolone cream (KENALOG) 0.1 % 1 application External 2-3 times a day for up to 14 days for 14 days     vitamin C (ASCORBIC ACID) 500 MG tablet Take 1 tablet (500 mg total) by mouth daily. 30 tablet 0   [DISCONTINUED] potassium chloride (MICRO-K) 10 MEQ CR capsule TAKE ONE CAPSULE BY MOUTH EVERY DAY WITH LASIX (Patient taking differently: Take 10 mEq by mouth daily.) 90 capsule 1   No current facility-administered medications on file prior to visit.    Allergies:   Allergies  Allergen Reactions   Amoxicillin Other (  See Comments)    Headache, debilitated   Bee Venom Anaphylaxis   Avelox [Moxifloxacin] Other (See Comments)    Unknown   Codeine Other (See Comments)   Duloxetine     Other reaction(s): Unknown   Duloxetine Hcl Other (See Comments)    felt weird   Levitra [Vardenafil] Other (See Comments)    Unknown   Morphine And Related  Other (See Comments)    hypotension   Penicillin G Sodium Other (See Comments)    Severe headache, fatigue "debilitated"   Testosterone Other (See Comments)    unknown   Tizanidine Other (See Comments)    unknown   Viagra [Sildenafil] Other (See Comments)    didn't like it    Physical Exam General: Mildly obese pleasant elderly Caucasian male seated, in no evident distress Head: head normocephalic and atraumatic.  Neck: supple with no carotid or supraclavicular bruits Cardiovascular: regular rate and rhythm, no murmurs Musculoskeletal: no deformity Skin:  no rash/petichiae Vascular:  Normal pulses all extremities Vitals:   10/03/21 1608  BP: (!) 146/71  Pulse: 63   Neurologic Exam Mental Status: Awake and fully alert. Oriented to place and time. Recent and remote memory intact. Attention span, concentration and fund of knowledge appropriate. Mood and affect appropriate.  Mild hesitant speech with occasional word finding difficulty and few paraphasic errors.  Diminished recall. Cranial Nerves: Fundoscopic exam reveals sharp disc margins. Pupils equal, briskly reactive to light. Extraocular movements full without nystagmus. Visual fields full to confrontation. Hearing intact. Facial sensation intact. Face, tongue, palate moves normally and symmetrically.  Motor: Mild proximal bilateral lower extremity symmetric weakness.  No focal weakness.  Tone is normal.. Sensory.: intact to touch ,pinprick .position and vibratory sensation.  Coordination: Rapid alternating movements normal in all extremities. Finger-to-nose and heel-to-shin performed accurately bilaterally. Gait and Station: Unable to test as patient is in a wheelchair and unable to walk due to chronic back pain  reflexes: 1+ and symmetric. Toes downgoing.   NIHSS  1 Modified Rankin  4   ASSESSMENT: 88 year old Caucasian male with right occipital and left frontal embolic infarct and May 5284 from atrial fibrillation on  anticoagulation with Xarelto.  He still have mild residual aphasia and baseline mild cognitive.  Vascular risk factors fibrillation, CAD, CHF, and hyperlipidemia     PLAN: I had a long d/w patient and his wife about his recent embolic stroke, chronic atrial fibrillation risk for recurrent stroke/TIAs, personally independently reviewed imaging studies and stroke evaluation results and answered questions.Continue Eliquis (apixaban) daily  for secondary stroke prevention and maintain strict control of hypertension with blood pressure goal below 130/90, diabetes with hemoglobin A1c goal below 6.5% and lipids with LDL cholesterol goal below 70 mg/dL. I also advised the patient to eat a healthy diet with plenty of whole grains, cereals, fruits and vegetables, exercise regularly and maintain ideal body weight .  Patient may also consider possible participation in [place] AF trial if interested and will be given information to review at home and decide.  Followup in the future with me in 6 months or call earlier if necessary.continue galantamine and Namenda for his cognitive impairment.  Greater than 50% of time during this 35 minute visit was spent on counseling,explanation of diagnosis, planning of further management, discussion with patient and family and coordination of care Antony Contras, MD Note: This document was prepared with digital dictation and possible smart phrase technology. Any transcriptional errors that result from this process are unintentional

## 2021-10-04 ENCOUNTER — Encounter: Payer: Self-pay | Admitting: Occupational Therapy

## 2021-10-04 ENCOUNTER — Encounter (HOSPITAL_BASED_OUTPATIENT_CLINIC_OR_DEPARTMENT_OTHER): Payer: Medicare Other | Attending: Physician Assistant | Admitting: Physician Assistant

## 2021-10-04 ENCOUNTER — Ambulatory Visit: Payer: Medicare Other | Admitting: Physical Therapy

## 2021-10-04 ENCOUNTER — Ambulatory Visit: Payer: Medicare Other

## 2021-10-04 ENCOUNTER — Ambulatory Visit: Payer: Medicare Other | Admitting: Occupational Therapy

## 2021-10-04 ENCOUNTER — Encounter: Payer: Self-pay | Admitting: Physical Therapy

## 2021-10-04 DIAGNOSIS — T8131XA Disruption of external operation (surgical) wound, not elsewhere classified, initial encounter: Secondary | ICD-10-CM | POA: Insufficient documentation

## 2021-10-04 DIAGNOSIS — Y838 Other surgical procedures as the cause of abnormal reaction of the patient, or of later complication, without mention of misadventure at the time of the procedure: Secondary | ICD-10-CM | POA: Diagnosis not present

## 2021-10-04 DIAGNOSIS — R41841 Cognitive communication deficit: Secondary | ICD-10-CM | POA: Diagnosis not present

## 2021-10-04 DIAGNOSIS — R262 Difficulty in walking, not elsewhere classified: Secondary | ICD-10-CM | POA: Diagnosis not present

## 2021-10-04 DIAGNOSIS — I7389 Other specified peripheral vascular diseases: Secondary | ICD-10-CM | POA: Insufficient documentation

## 2021-10-04 DIAGNOSIS — W19XXXA Unspecified fall, initial encounter: Secondary | ICD-10-CM | POA: Insufficient documentation

## 2021-10-04 DIAGNOSIS — R2681 Unsteadiness on feet: Secondary | ICD-10-CM

## 2021-10-04 DIAGNOSIS — R208 Other disturbances of skin sensation: Secondary | ICD-10-CM | POA: Diagnosis not present

## 2021-10-04 DIAGNOSIS — N401 Enlarged prostate with lower urinary tract symptoms: Secondary | ICD-10-CM | POA: Insufficient documentation

## 2021-10-04 DIAGNOSIS — L97822 Non-pressure chronic ulcer of other part of left lower leg with fat layer exposed: Secondary | ICD-10-CM | POA: Insufficient documentation

## 2021-10-04 DIAGNOSIS — L97512 Non-pressure chronic ulcer of other part of right foot with fat layer exposed: Secondary | ICD-10-CM | POA: Insufficient documentation

## 2021-10-04 DIAGNOSIS — I69351 Hemiplegia and hemiparesis following cerebral infarction affecting right dominant side: Secondary | ICD-10-CM

## 2021-10-04 DIAGNOSIS — R2689 Other abnormalities of gait and mobility: Secondary | ICD-10-CM

## 2021-10-04 DIAGNOSIS — L97522 Non-pressure chronic ulcer of other part of left foot with fat layer exposed: Secondary | ICD-10-CM | POA: Insufficient documentation

## 2021-10-04 DIAGNOSIS — L89153 Pressure ulcer of sacral region, stage 3: Secondary | ICD-10-CM | POA: Insufficient documentation

## 2021-10-04 DIAGNOSIS — L97312 Non-pressure chronic ulcer of right ankle with fat layer exposed: Secondary | ICD-10-CM | POA: Diagnosis not present

## 2021-10-04 DIAGNOSIS — M6281 Muscle weakness (generalized): Secondary | ICD-10-CM

## 2021-10-04 DIAGNOSIS — S71111A Laceration without foreign body, right thigh, initial encounter: Secondary | ICD-10-CM | POA: Diagnosis not present

## 2021-10-04 DIAGNOSIS — L89613 Pressure ulcer of right heel, stage 3: Secondary | ICD-10-CM | POA: Insufficient documentation

## 2021-10-04 DIAGNOSIS — R4701 Aphasia: Secondary | ICD-10-CM | POA: Diagnosis not present

## 2021-10-04 DIAGNOSIS — R41840 Attention and concentration deficit: Secondary | ICD-10-CM | POA: Diagnosis not present

## 2021-10-04 DIAGNOSIS — S81802A Unspecified open wound, left lower leg, initial encounter: Secondary | ICD-10-CM | POA: Diagnosis not present

## 2021-10-04 DIAGNOSIS — I872 Venous insufficiency (chronic) (peripheral): Secondary | ICD-10-CM | POA: Insufficient documentation

## 2021-10-04 DIAGNOSIS — T8189XA Other complications of procedures, not elsewhere classified, initial encounter: Secondary | ICD-10-CM | POA: Diagnosis not present

## 2021-10-04 DIAGNOSIS — S91001A Unspecified open wound, right ankle, initial encounter: Secondary | ICD-10-CM | POA: Diagnosis not present

## 2021-10-04 DIAGNOSIS — S91301A Unspecified open wound, right foot, initial encounter: Secondary | ICD-10-CM | POA: Diagnosis not present

## 2021-10-04 NOTE — Progress Notes (Addendum)
[name], [name] Kitchen ([id number]) Visit Report for 10/04/2021 Chief Complaint Document Details Patient Name: Date of Service: [name] [name] ID I. 10/04/2021 11:15 A M Medical Record Number: [id number] Patient Account Number: [id number] Date of Birth/Sex: Treating RN: 03-15-1926 (88 y.o. [name] Primary Care Provider: [name] Other Clinician: Referring Provider: Treating Provider/Extender: Eli Hose in Treatment: 5 Information Obtained from: Patient Chief Complaint Multiple ulcers noted over the upper and lower extremities Electronic Signature(s) Signed: 10/04/2021 11:30:23 AM By: [name] PA-C Entered By: [name] on 10/04/2021 11:30:23 -------------------------------------------------------------------------------- Problem List Details Patient Name: Date of Service: [name], [name] 10/04/2021 11:15 A M Medical Record Number: [id number] Patient Account Number: [id number] Date of Birth/Sex: Treating RN: 1926-06-21 (88 y.o. [name], [name] Primary Care Provider: [name] Other Clinician: Referring Provider: Treating Provider/Extender: Eli Hose in Treatment: 58 Active Problems ICD-10 Encounter Code Description Active Date MDM Diagnosis L89.613 Pressure ulcer of right heel, stage 3 09/21/2020 No Yes I73.89 Other specified peripheral vascular diseases 09/21/2020 No Yes I87.2 Venous insufficiency (chronic) (peripheral) 11/16/2020 No Yes L97.312 Non-pressure chronic ulcer of right ankle with fat layer exposed 11/16/2020 No Yes L97.822 Non-pressure chronic ulcer of other part of left lower leg with fat layer exposed11/16/2022 No Yes L89.153 Pressure ulcer of sacral region, stage 3 03/22/2021 No Yes S71.111D Laceration without foreign body, right thigh, subsequent encounter 05/03/2021 No Yes T81.31XA Disruption of external operation (surgical) wound, not elsewhere classified, 07/05/2021 No Yes initial  encounter L97.512 Non-pressure chronic ulcer of other part of right foot with fat layer exposed 10/04/2021 No Yes L97.522 Non-pressure chronic ulcer of other part of left foot with fat layer exposed 10/04/2021 No Yes N40.1 Benign prostatic hyperplasia with lower urinary tract symptoms 09/21/2020 No Yes N18.30 Chronic kidney disease, stage 3 unspecified 09/21/2020 No Yes I10 Essential (primary) hypertension 09/21/2020 No Yes Inactive Problems Resolved Problems ICD-10 Code Description Active Date Resolved Date L98.8 Other specified disorders of the skin and subcutaneous tissue 04/05/2021 04/05/2021 S41.111D Laceration without foreign body of right upper arm, subsequent encounter 05/03/2021 05/03/2021 S41.112A Laceration without foreign body of left upper arm, initial encounter 05/10/2021 05/10/2021 Electronic Signature(s) Signed: 10/04/2021 12:02:46 PM By: [name] PA-C Previous Signature: 10/04/2021 11:30:11 AM Version By: [name] PA-C Entered By: [name] on 10/04/2021 12:02:46

## 2021-10-04 NOTE — Therapy (Signed)
OUTPATIENT OCCUPATIONAL THERAPY  TREATMENT NOTE  Patient Name: [name] [name] MRN: [id number] DOB:[birth year], 88 y.o., male Today's Date: 10/04/2021  PCP: [name], MD REFERRING PROVIDER: Charlett Blake, MD    OT End of Session - 10/04/21 1445     Visit Number 9    Number of Visits 17    Date for OT Re-Evaluation 10/27/21    Authorization Type BCBS Medicare    OT Start Time 1443    OT Stop Time 1527    OT Time Calculation (min) 44 min    Activity Tolerance Patient tolerated treatment well    Behavior During Therapy WFL for tasks assessed/performed            Past Medical History:  Diagnosis Date   AAA (abdominal aortic aneurysm) ([place])    Arthritis    Ascending aorta dilatation ([place]) 03/13/2012   Fusiform dilatation of the ascending thoracic aorta discovered during surgery, max diameter 4.2-4.3 cm   BPH (benign prostatic hypertrophy)    Chronic systolic heart failure ([place]) 03/03/2012   Chronic venous insufficiency    GERD (gastroesophageal reflux disease)    Glaucoma    Gout    History of kidney stones    Hyperlipidemia    Hypertension    Obesity (BMI 30-39.9)    Paroxysmal atrial fibrillation ([place]) 02/29/2012   Recurrent paroxysmal, new-onset    PMR (polymyalgia rheumatica) (HCC)    Polymyalgia rheumatica ([place]) 02/06/2019   Pre-diabetes    S/P CABG x 3 03/06/2012   LIMA to LAD, SVG to OM, SVG to RCA, EVH via right thigh   S/P Maze operation for atrial fibrillation 03/06/2012   Complete bilateral lesions set using bipolar radiofrequency and cryothermy ablation with clipping of LA appendage   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   s/p TAVR with a 26 mm Edwards via the left subclavian by Drs Buena Irish and Bartle   Severe aortic stenosis 09/29/2019   Sleep apnea    USES CPAP NIGHTLY   Past Surgical History:  Procedure Laterality Date   ABDOMINAL AORTOGRAM W/LOWER EXTREMITY N/A 10/26/2020   Procedure: ABDOMINAL AORTOGRAM W/LOWER  EXTREMITY;  Surgeon: Broadus John, MD;  Location: [facility];  Service: Cardiovascular;  Laterality: N/A;   CARDIAC CATHETERIZATION     CATARACT EXTRACTION, BILATERAL     with lens implants   CHOLECYSTECTOMY N/A 03/24/2012   Procedure: LAPAROSCOPIC CHOLECYSTECTOMY WITH INTRAOPERATIVE CHOLANGIOGRAM;  Surgeon: Zenovia Jarred, MD;  Location: [facility];  Service: General;  Laterality: N/A;   CORONARY ARTERY BYPASS GRAFT N/A 03/06/2012   Procedure: CORONARY ARTERY BYPASS GRAFTING (CABG);  Surgeon: Rexene Alberts, MD;  Location: [facility];  Service: Open Heart Surgery;  Laterality: N/A;   ENDOVEIN HARVEST OF GREATER SAPHENOUS VEIN Right 03/06/2012   Procedure: ENDOVEIN HARVEST OF GREATER SAPHENOUS VEIN;  Surgeon: Rexene Alberts, MD;  Location: [facility];  Service: Open Heart Surgery;  Laterality: Right;   INTRAOPERATIVE TRANSESOPHAGEAL ECHOCARDIOGRAM N/A 03/06/2012   Procedure: INTRAOPERATIVE TRANSESOPHAGEAL ECHOCARDIOGRAM;  Surgeon: Rexene Alberts, MD;  Location: [facility];  Service: Open Heart Surgery;  Laterality: N/A;   IR KYPHO LUMBAR INC FX REDUCE BONE BX UNI/BIL CANNULATION INC/IMAGING  02/23/2020   LEFT HEART CATH Right 03/02/2012   Procedure: LEFT HEART CATH;  Surgeon: Sherren Mocha, MD;  Location: [facility];  Service: Cardiovascular;  Laterality: Right;   MAZE N/A 03/06/2012   Procedure: MAZE;  Surgeon: Rexene Alberts, MD;  Location: [facility];  Service:  Open Heart Surgery;  Laterality: N/A;   PERIPHERAL VASCULAR INTERVENTION Right 10/26/2020   Procedure: PERIPHERAL VASCULAR INTERVENTION;  Surgeon: Broadus John, MD;  Location: [facility];  Service: Cardiovascular;  Laterality: Right;   PROSTATECTOMY     partial   RIGHT/LEFT HEART CATH AND CORONARY/GRAFT ANGIOGRAPHY N/A 09/29/2019   Procedure: RIGHT/LEFT HEART CATH AND CORONARY/GRAFT ANGIOGRAPHY;  Surgeon: Adrian Prows, MD;  Location: [facility];  Service: Cardiovascular;  Laterality: N/A;   TEE WITHOUT CARDIOVERSION N/A 11/10/2019    Procedure: TRANSESOPHAGEAL ECHOCARDIOGRAM (TEE);  Surgeon: Burnell Blanks, MD;  Location: [facility];  Service: Open Heart Surgery;  Laterality: N/A;   TRANSCATHETER AORTIC VALVE REPLACEMENT, TRANSFEMORAL  11/10/2019   Patient Active Problem List   Diagnosis Date Noted   Left middle cerebral artery stroke ([place]) 05/31/2021   Acute CVA (cerebrovascular accident) ([place]) 05/25/2021   DNR (do not resuscitate) 05/25/2021   Osteoporosis 05/05/2021   Gouty arthritis 02/13/2021   Morbid obesity ([place]) 02/13/2021   Abdominal aortic aneurysm without rupture ([place]) 11/22/2020   Acute renal failure syndrome ([place]) 11/22/2020   Low back pain 11/22/2020   Skin tear of right upper arm without complication 95/09/3265   Pressure injury of skin 10/23/2020   Urinary tract infection with hematuria 10/22/2020   Encephalopathy 10/22/2020   AKI (acute kidney injury) ([place])    Overweight (BMI 25.0-29.9) 08/19/2020   Vertebral fracture, osteoporotic ([place]) 08/18/2020   T8 vertebral fracture ([place]) 08/17/2020   Thrombocytopenia ([place]) 08/17/2020   CKD (chronic kidney disease) stage 3, GFR 30-59 ml/min (HCC) 08/17/2020   Elevated troponin 08/17/2020   Pain due to onychomycosis of toenails of both feet 07/27/2020   Callus of heel 07/27/2020   Bifascicular block 11/11/2019   Acute on chronic diastolic heart failure ([place]) 11/10/2019   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   Severe aortic stenosis 09/29/2019   Aortic valve disorder 12/45/8099   Systolic heart failure ([place]) 03/11/2019   Thrombophilia ([place]) 02/26/2019   Muscle pain 02/11/2019   Weakness 02/06/2019   Polymyalgia rheumatica ([place]) 02/06/2019   Shoulder joint pain 02/02/2019   Anemia 01/26/2019   Malignant melanoma of skin ([place]) 01/26/2019   Pain in right leg 01/26/2019   DDD (degenerative disc disease), cervical 05/28/2018   Encounter for general adult medical examination without abnormal findings 11/19/2017   Carotid artery  occlusion 07/03/2017   Hearing loss 07/03/2017   Fall 06/19/2017   Injury of upper extremity 06/19/2017   Neck pain 06/19/2017   Headache 06/19/2017   Visual disturbance 06/19/2017   Ganglion of hand 05/08/2017   Dilatation of aorta ([place]) 02/14/2017   Plantar fascial fibromatosis 02/14/2017   Cardiac murmur, unspecified 02/27/2016   Benign neoplasm of colon 08/24/2015   Dementia ([place]) 08/24/2015   Influenza due to unidentified influenza virus with other respiratory manifestations 03/15/2015   Epistaxis 12/01/2014   OSA (obstructive sleep apnea) 11/18/2014   Gastro-esophageal reflux disease with esophagitis 10/29/2014   Angina pectoris ([place]) 05/04/2014   Abnormal feces 07/31/2013   Other specified disorders of gingiva and edentulous alveolar ridge 07/31/2013   Spontaneous ecchymosis 10/10/2012   Thoracic ascending aortic aneurysm ([place]) 09/29/2012   Microscopic hematuria 04/11/2012   Proteinuria 04/11/2012   S/P laparoscopic cholecystectomy 04/09/2012   S/P CABG x 3 03/06/2012   S/P Maze operation for atrial fibrillation 03/06/2012   Paroxysmal atrial fibrillation ([place]) 02/29/2012   Bitten or stung by nonvenomous insect and other nonvenomous arthropods, initial encounter 12/03/2011   Depression  screening 10/17/2011   CAD (coronary artery disease), native coronary artery    Hyperlipidemia    Hypertension    Chronic venous insufficiency    Gout    Benign prostatic hyperplasia    Post-traumatic stress disorder 03/13/2011   Pain in left leg 12/08/2010   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   Fatigue 08/08/2010   Retention of urine 04/18/2010   Constipation 07/05/2009   Kidney stone 07/05/2009   ED (erectile dysfunction) of organic origin 12/14/2008   Impaired fasting glucose 12/14/2008   Vitamin B deficiency 12/14/2008    ONSET DATE: 05/25/2021  REFERRING DIAG: S28.768 (ICD-10-CM) - Left middle cerebral artery stroke   THERAPY DIAG:  Hemiplegia and hemiparesis following  cerebral infarction affecting right dominant side (HCC)  Muscle weakness (generalized)  Other disturbances of skin sensation  Attention and concentration deficit  Unsteadiness on feet  Rationale for Evaluation and Treatment Rehabilitation  SUBJECTIVE:   SUBJECTIVE STATEMENT: Pt reports he is still having trouble transferring in and out of the shower and requires assist from an aide Pt accompanied by: self and significant other (dropped off pt)  PERTINENT HISTORY:  H/o AAA, CAD with CABG, compression fractures, chronic anemia followed by hematology services, chronic systolic congestive heart failure, A-fib/AF status post MAZE on Xarelto, hypertension, hyperlipidemia, PMR maintained on steroids, prediabetes, early dementia maintained on Namenda, chronic lower extremity wounds with Unna boot OSA with CPAP, PAD with lymphedema status post balloon angioplasty of posterior tibial artery October 2022 recent UTI placed on antibiotic therapy.  He does have a personal care attendant Monday Wednesday Friday for 3 hours.  Modified independent transfers power wheelchair bound x3 to 4 months prior to that ambulating short distances with a rollator.  Present 05/25/2021 with right-sided weakness and aphasia of acute onset.  Cranial CT scan showed small left MCA territory cortical infarct within the mid to posterior left frontal lobe. CT angiogram head and neck no large vessel occlusion.   PRECAUTIONS: Fall  PAIN:  Are you having pain? Yes: NPRS scale: 4/10 Pain location: mid back and legs Pain description: constant; dull, achy Aggravating factors: standing, certain movements/exercises Relieving factors: injections, flat on back  FALLS: Has patient fallen in last 6 months? Yes. Number of falls - 4, but has not fallen recently, per pt report  LIVING ENVIRONMENT: Lives with: lives with their spouse Lives in: House/apartment Stairs:  Yes: steps on front and back of house, however has an elevator at the  back, steps to 2nd floor but does not need to go upstairs Has following equipment at home: Environmental consultant - 2 wheeled, Environmental consultant - 4 wheeled, Wheelchair (power), Wheelchair (manual), shower chair, bed side commode, Grab bars, and elevator to access home from outside, hand held shower head  PLOF:  Has a caregiver 3x/week that assists with ADLs, d/t spinal compression fx pt has depended more on w/c in 5-6 months  PATIENT GOALS: to talk properly; to write and to spell   OBJECTIVE:   HAND DOMINANCE: Right  ADLs: Dressing: Setup w/ UB dressing, wife assists w/ pants, socks, shoes Grooming: Mod I from seated position Eating: reports great difficulty using a knife to cut food, wife frequently cuts foods for him Toileting: Occ difficulty pulling pants back up after toileting, leans forward to complete hygiene Bathing: Supervision/setup  Tub Shower transfers: caregiver assist w/ transfers in/out of shower providing SPV and occ Min A Equipment: Shower seat with back, Grab bars, Walk in shower, bed side commode, Reacher, and Sock aid  IADLs: Financial management:  Reports difficulty with writing a check, math, spelling, but did most of financial management Handwriting: 50% legible and difficulty with spelling that has gotten worse since CVA  MOBILITY STATUS: Needs Assist: Mod I stand pivot from w/c, Supervision for shower transfers  FUNCTIONAL OUTCOME MEASURES: FOTO: 60 on 09/27/21  UPPER EXTREMITY MMT:    RUE and LUE grossly 4+/5  HAND FUNCTION: Grip strength: Right: 30 lbs; Left: 30 lbs  COORDINATION: 9 Hole Peg test: Right: 1:06.53 sec; Left: 34.53 sec 09/20/21: Right: 56.15 sec  SENSATION: Reports numbness and difficulty maintaining grasp on spoon/fork and writing utensil  -------------------------------------------------------------------------------------------------------------------------------------------------------- (objective measures above completed at initial evaluation unless  otherwise dated)  TODAY'S TREATMENT:  10/04/21 Handwriting: practiced handwriting on standard lined paper w/ good legibility and slightly decr speed and smoothness of lines; OT incorporated history trivia questions to also address attention/cognition and spelling. Pt able to correctly answer questions and spell familiar words w/ min difficulty; required verbal cues (help sounding out words) for spelling about 50% of the time, though some words were inherently difficult (e.g., protestant, Laurie). Letter formation, spacing, sizing were all WNL. No letter reversals noted today. FMC/Coordination: picking up 3 marbles one at a time, translating each to fingertips, and placing on easy-grip pegs to facilitate in-hand manipulation, control, and intrinsic hand strengthening. Completed 20 marbles w/ multiple drops; increased success with decreased speed; used same sequencing/skill to return marbles to container. OT instructed pt to maintain finger adduction as able during in-hand manipulation to prevent marbles from slipping between digits. Self-care/ADLs: education provided/demonstrated on potential benefit of long-handle shoe horn; will trial next session. Also discussed current strategies for shower transfers (pt has walk-in shower); may benefit from practicing simulated transfer to increase safety and develop compensatory/adaptive strategies prn  10/02/21 Coordination: engaged in small peg board pattern replication with RUE.  Pt demonstrating improvements in ability to pick up small pegs, however requiring intermittent assistance from LUE to assist in manipulating/rotating pegs to place in to place in hole.  Pt demonstrating decreased incorporation of index finger into manipulation of pegs, however with min cues pt demonstrating increased attempts at incorporating.  Pt dropping 3 of 36 pegs and utilizing peg board to reposition hand placement 25% of time.  Removing pegs with increased focus on precision  pinch.   Handwriting: engaged in writing out names of foods in alphabetical order with focus on legibility, correct spelling, and word production.  Pt making a list of letters in order and then filling in words as he thinks of them.  Pt initially having difficulty with recalling letters in alphabet post M and then skipping >50% of the words to return to.  Pt utilizing slightly weighted pen for increased proprioceptive input for writing.  Handwriting 90% legible with pt crossing out a few letters during activity.  Discussed utilizing capital letters as needed for increased legibility.    09/27/21 ADL: reports requiring assistance for getting pants on/off but that he feels that he could do it but it requires increased time and effort.  Getting on/off toilet can be challenging.  Pt reports increased difficulty with getting pants and briefs up post toileting.   Performed FOTO.  Pt continues to report impairments with handwriting and typing and inconsistent impairments impacting functional tasks.  Coordination: picking up variety of small items with increased time and effort.  Pt initially attempting to slide off edge of table.  Pt with increased difficulty with smaller beads and beans compared to stones and coins.  Progressed to in-hand  manipulation and translation with pt dropping 1/5 stones when translating from finger tips to palm and 2/5 stones when attempting to translate from palm to finger tips.  Increased challenge to completing in-hand manipulation and translation with coins to then place into coin slot.  Pt demonstrating improved coordination and motor control with coins compared to stones.  Pt verbalizes needing to visually attend to task to "move the coin".  Rotating pen in finger tips and walking fingers up/down pen.  Discussed structured and functional [place] and in-hand manipulation tasks to continue to address coordination at home.  Provided with printed handouts.  09/25/21 Pipe tree puzzle:  replication of pattern with focus on BUE use, sequencing, organization, and problem solving. Pt required initial pattern and demonstration of coding to correctly identify each piece.  Pt able to complete with mild increase in time, however making no errors. Categorizing: provided with list of items and directed to place in categories according to where he would purchase items.  Task facilitating handwriting, thinking skills of organization and sequencing. Pt with letter reversals such as "b, d, and p".  OT then challenged pt to estimate how much money he would spend at each store.  Pt able to complete systematically and able to report rationale for prices as needed.   PATIENT EDUCATION: Ongoing condition-specific education related to therapeutic interventions completed this session Person educated: Patient Education method: Explanation and Demonstration Education comprehension: verbalized understanding   HOME EXERCISE PROGRAM: Access Code: [email or web address] URL: [email or web address] Date: 09/11/2021 Prepared by: [facility]  Exercises - Putty Squeezes  - 1 x daily - 1 sets - 10 reps - Rolling Putty on Table  - 1 x daily - 1 sets - 10 reps - Thumb Opposition with Putty  - 1 x daily - 1 sets - 10 reps - Key Pinch with Putty  - 1 x daily - 1 sets - 10 reps - Tip Pinch with Putty  - 1 x daily - 1 sets - 10 reps - Removing Marbles from Putty  - 1 x daily - 1 sets - 10 reps  GOALS: Goals reviewed with patient? Yes  SHORT TERM GOALS: Target date: 09/29/2021  Pt will verbalize understanding of adapted strategies and/or potential AE needs to increase ease, safety, and independence w/ LB dressing. Baseline: Goal status: NOT MET - 09/27/21 Pt reports still having difficulty with getting pants up/down and on/off. Will benefit from simulated practice but have not to this point due to focus on [facility] and handwriting  2.  Pt will be independent with FMC/GMC  and strengthening HEP to increase independence with handwriting and cutting of foods. Baseline:  Goal status: MET - 09/27/21  3. Pt will demonstrate improved fine motor coordination for ADLs as evidenced by decreasing 9 hole peg test score for RUE by 5 secs  Baseline: 9 Hole Peg test: Right: 1:06.53 sec; Left: 34.53 sec  Goal status: MET - 56.15 sec   LONG TERM GOALS: Target date: 10/27/2021  Pt will report ability to complete LB dressing with supervision for standing balance and use of AE as needed. Baseline:  Goal status: MET - 10/04/21  2.  Pt will demonstrated improved standing balance to stand for 1 min with alternating UE to allow for increased independence with LB dressing/clothing management post toileting. Baseline:  Goal status: IN PROGRESS  3.  Pt will demonstrated improved working memory and recall during check writing/balancing check book by making 0 errors. Baseline:  Goal  status: IN PROGRESS  4.  Pt will demonstrate improved fine motor coordination for ADLs as evidenced by decreasing 9 hole peg test score for RUE by 20 secs Baseline: 9 Hole Peg test: Right: 1:06.53 sec; Left: 34.53 sec Upgraded 09/20/21 due to completing in 56.15 sec Goal status: IN PROGRESS   5.  Pt will write a short paragraph with 90% legibility and no significant errors in spelling as evidenced by </= to 3 spelling errors. Baseline:  Goal status: IN PROGRESS   ASSESSMENT:  CLINICAL IMPRESSION: Session today w/ continued focus on coordination and motor control during handwriting. OT increased challenge of handwriting activity by including spelling component via world history trivia questions as this is an area of interest for pt. Pt benefited from cues for pacing and verbal prompts when stuck on spelling certain words though success did improve w/ continued engagement in activity, misspelling more words at the start of the activity compared to toward the end of the activity, despite level of  difficulty spelling remaining the same. OT also continued discussion regarding current level of participation in functional tasks w/ pt reporting interest in continuing to address LB dressing and functional transfers; may benefit from simulated practice in upcoming sessions.  PERFORMANCE DEFICITS in functional skills including ADLs, IADLs, coordination, sensation, ROM, strength, pain, FMC, GMC, balance, body mechanics, endurance, decreased knowledge of use of DME, and UE functional use, cognitive skills including attention, problem solving, sequencing, and thought, and psychosocial skills including coping strategies, environmental adaptation, and routines and behaviors.   IMPAIRMENTS are limiting patient from ADLs and IADLs.   COMORBIDITIES may have co-morbidities  that affects occupational performance. Patient will benefit from skilled OT to address above impairments and improve overall function.  PLAN: OT FREQUENCY: 2x/week  OT DURATION: 8 weeks  PLANNED INTERVENTIONS: self care/ADL training, therapeutic exercise, therapeutic activity, neuromuscular re-education, balance training, functional mobility training, moist heat, cryotherapy, patient/family education, cognitive remediation/compensation, psychosocial skills training, energy conservation, coping strategies training, and DME and/or AE instructions  RECOMMENDED OTHER SERVICES: N/A  CONSULTED AND AGREED WITH PLAN OF CARE: Patient  PLAN FOR NEXT SESSION: practice w/ long-hand shoe horn, problem solving LB dressing, getting up/down from toilet, shower transfers; review coordination HEP if needed, [facility] tasks to address handwriting and other alternating attention tasks, word games/association for aphasia and carryover to spelling   Kathrine Cords, OTR/L 10/04/2021, 3:28 PM

## 2021-10-05 ENCOUNTER — Ambulatory Visit
Admission: RE | Admit: 2021-10-05 | Discharge: 2021-10-05 | Disposition: A | Discharge status: Home / Self Care | Payer: Medicare Other | Source: Ambulatory Visit | Attending: Physician Assistant | Admitting: Physician Assistant

## 2021-10-05 ENCOUNTER — Other Ambulatory Visit: Payer: Self-pay | Admitting: Physician Assistant

## 2021-10-05 DIAGNOSIS — S91109A Unspecified open wound of unspecified toe(s) without damage to nail, initial encounter: Secondary | ICD-10-CM

## 2021-10-05 DIAGNOSIS — S91102A Unspecified open wound of left great toe without damage to nail, initial encounter: Secondary | ICD-10-CM | POA: Diagnosis not present

## 2021-10-05 NOTE — Progress Notes (Signed)
[name] [name], [name] [name]. ([id number]) Visit Report for 10/04/2021 Arrival Information Details Patient Name: Date of Service: [name] [name]. 10/04/2021 11:15 A M Medical Record Number: [id number] Patient Account Number: [id number] Date of Birth/Sex: Treating RN: May 08, 1926 (88 y.o. [name] [name] Primary Care [name] [name]: [name] Other Clinician: Referring [name] [name]: Treating [name] [name]/Extender: Eli Hose in Treatment: 72 Visit Information History Since Last Visit Added or deleted any medications: No Patient Arrived: Wheel Chair Any new allergies or adverse reactions: No Arrival Time: 11:40 Had a fall or experienced change in No Accompanied By: wife activities of daily living that may affect Transfer Assistance: None risk of falls: Patient Identification Verified: Yes Signs or symptoms of abuse/neglect since last visito No Secondary Verification Process Completed: Yes Hospitalized since last visit: No Patient Requires Transmission-Based Precautions: No Implantable device outside of the clinic excluding No Patient Has Alerts: Yes cellular tissue based products placed in the center Patient Alerts: Patient on Blood Thinner since last visit: Has Dressing in Place as Prescribed: Yes Pain Present Now: Yes Electronic Signature(s) Signed: 10/04/2021 4:20:32 PM By: Erenest Blank Entered By: Erenest Blank on 10/04/2021 11:41:13 -------------------------------------------------------------------------------- Clinic Level of Care Assessment Details Patient Name: Date of Service: [name] [name]. 10/04/2021 11:15 A M Medical Record Number: [id number] Patient Account Number: [id number] Date of Birth/Sex: Treating RN: 1926/02/24 (88 y.o. [name] [name] Primary Care [name] [name]: [name] Other Clinician: Referring [name] [name]: Treating [name] [name]/Extender: Eli Hose in Treatment: 49 Clinic Level of Care Assessment Items TOOL 4 Quantity  Score X- 1 0 Use when only an EandM is performed on FOLLOW-UP visit ASSESSMENTS - Nursing Assessment / Reassessment X- 1 10 Reassessment of Co-morbidities (includes updates in patient status) X- 1 5 Reassessment of Adherence to Treatment Plan ASSESSMENTS - Wound and Skin A ssessment / Reassessment '[]'$  - 0 Simple Wound Assessment / Reassessment - one wound X- 7 5 Complex Wound Assessment / Reassessment - multiple wounds X- 1 10 Dermatologic / Skin Assessment (not related to wound area) ASSESSMENTS - Focused Assessment X- 2 5 Circumferential Edema Measurements - multi extremities X- 1 10 Nutritional Assessment / Counseling / Intervention '[]'$  - 0 Lower Extremity Assessment (monofilament, tuning fork, pulses) '[]'$  - 0 Peripheral Arterial Disease Assessment (using hand held doppler) ASSESSMENTS - Ostomy and/or Continence Assessment and Care '[]'$  - 0 Incontinence Assessment and Management '[]'$  - 0 Ostomy Care Assessment and Management (repouching, etc.) PROCESS - Coordination of Care '[]'$  - 0 Simple Patient / Family Education for ongoing care X- 1 20 Complex (extensive) Patient / Family Education for ongoing care X- 1 10 Staff obtains Programmer, systems, Records, T Results / Process Orders est '[]'$  - 0 Staff telephones HHA, Nursing Homes / Clarify orders / etc '[]'$  - 0 Routine Transfer to another Facility (non-emergent condition) '[]'$  - 0 Routine Hospital Admission (non-emergent condition) '[]'$  - 0 New Admissions / Biomedical engineer / Ordering NPWT Apligraf, etc. , '[]'$  - 0 Emergency Hospital Admission (emergent condition) '[]'$  - 0 Simple Discharge Coordination X- 1 15 Complex (extensive) Discharge Coordination PROCESS - Special Needs '[]'$  - 0 Pediatric / Minor Patient Management '[]'$  - 0 Isolation Patient Management '[]'$  - 0 Hearing / Language / Visual special needs '[]'$  - 0 Assessment of Community assistance (transportation, D/C planning, etc.) '[]'$  - 0 Additional assistance / Altered  mentation '[]'$  - 0 Support Surface(s) Assessment (bed, cushion, seat, etc.) INTERVENTIONS - Wound Cleansing / Measurement '[]'$  - 0 Simple  Wound Cleansing - one wound X- 7 5 Complex Wound Cleansing - multiple wounds X- 1 5 Wound Imaging (photographs - any number of wounds) '[]'$  - 0 Wound Tracing (instead of photographs) '[]'$  - 0 Simple Wound Measurement - one wound X- 7 5 Complex Wound Measurement - multiple wounds INTERVENTIONS - Wound Dressings X - Small Wound Dressing one or multiple wounds 2 10 X- 2 15 Medium Wound Dressing one or multiple wounds '[]'$  - 0 Large Wound Dressing one or multiple wounds '[]'$  - 0 Application of Medications - topical '[]'$  - 0 Application of Medications - injection INTERVENTIONS - Miscellaneous '[]'$  - 0 External ear exam '[]'$  - 0 Specimen Collection (cultures, biopsies, blood, body fluids, etc.) '[]'$  - 0 Specimen(s) / Culture(s) sent or taken to Lab for analysis '[]'$  - 0 Patient Transfer (multiple staff / Civil Service fast streamer / Similar devices) '[]'$  - 0 Simple Staple / Suture removal (25 or less) '[]'$  - 0 Complex Staple / Suture removal (26 or more) '[]'$  - 0 Hypo / Hyperglycemic Management (close monitor of Blood Glucose) '[]'$  - 0 Ankle / Brachial Index (ABI) - do not check if billed separately X- 1 5 Vital Signs Has the patient been seen at the hospital within the last three years: Yes Total Score: 255 Level Of Care: New/Established - Level 5 Electronic Signature(s) Signed: 10/05/2021 5:16:03 PM By: Deon Pilling RN, BSN Entered By: Deon Pilling on 10/04/2021 13:02:15 -------------------------------------------------------------------------------- Encounter Discharge Information Details Patient Name: Date of Service: [name] [name], [name] [name]. 10/04/2021 11:15 A M Medical Record Number: [id number] Patient Account Number: [id number] Date of Birth/Sex: Treating RN: 10-09-1926 (88 y.o. [name] [name] Primary Care [name] [name]: [name] Other Clinician: Referring  [name] [name]: Treating [name] [name]/Extender: Eli Hose in Treatment: 102 Encounter Discharge Information Items Discharge Condition: Stable Ambulatory Status: Wheelchair Discharge Destination: Home Transportation: Private Auto Accompanied By: wife Schedule Follow-up Appointment: Yes Clinical Summary of Care: Electronic Signature(s) Signed: 10/05/2021 5:16:03 PM By: Deon Pilling RN, BSN Entered By: Deon Pilling on 10/04/2021 13:08:50 -------------------------------------------------------------------------------- Lower Extremity Assessment Details Patient Name: Date of Service: [name] [name], [name] [name]. 10/04/2021 11:15 A M Medical Record Number: [id number] Patient Account Number: [id number] Date of Birth/Sex: Treating RN: 30-Oct-1926 (88 y.o. [name] [name] Primary Care Hesper Venturella: [name] Other Clinician: Referring Carroll Lingelbach: Treating Cavion Faiola/Extender: [name] in Treatment: 54 Edema Assessment Assessed: [Left: No] [Right: No] Edema: [Left: Yes] [Right: Yes] Calf Left: Right: Point of Measurement: 34 cm From Medial Instep 35.5 cm 35.5 cm Ankle Left: Right: Point of Measurement: 11 cm From Medial Instep 23.5 cm 25 cm Vascular Assessment Pulses: Dorsalis Pedis Palpable: [Left:Yes] [Right:Yes] Electronic Signature(s) Signed: 10/04/2021 4:20:32 PM By: Erenest Blank Signed: 10/05/2021 5:16:03 PM By: Deon Pilling RN, BSN Entered By: Erenest Blank on 10/04/2021 11:45:14 -------------------------------------------------------------------------------- Multi-Disciplinary Care Plan Details Patient Name: Date of Service: [name] [name], [name] [name]. 10/04/2021 11:15 A M Medical Record Number: [id number] Patient Account Number: [id number] Date of Birth/Sex: Treating RN: [birth year] (88 y.o. [name] [name] Primary Care Acel Natzke: [name] Other Clinician: Referring Koni Kannan: Treating Tymeshia Awan/Extender: Eli Hose in Treatment: 26 Active Inactive Abuse / Safety / Falls / Self Care Management Nursing Diagnoses: Potential for falls Goals: Patient will remain injury free related to falls Date Initiated: 03/22/2021 Target Resolution Date: 12/01/2021 Goal Status: Active Interventions: Assess: immobility, friction, shearing, incontinence upon admission and as needed Assess impairment of mobility on  admission and as needed per policy Assess self care needs on admission and as needed Provide education on fall prevention Notes: Nutrition Nursing Diagnoses: Potential for alteratiion in Nutrition/Potential for imbalanced nutrition Goals: Patient/caregiver agrees to and verbalizes understanding of need to obtain nutritional consultation Date Initiated: 03/22/2021 T arget Resolution Date: 12/01/2021 Goal Status: Active Interventions: Provide education on nutrition Treatment Activities: Education provided on Nutrition : 09/20/2021 Giving encouragement to exercise : 03/22/2021 Notes: Electronic Signature(s) Signed: 10/05/2021 5:16:03 PM By: Deon Pilling RN, BSN Entered By: Deon Pilling on 10/04/2021 11:58:06 -------------------------------------------------------------------------------- Pain Assessment Details Patient Name: Date of Service: [name] [name], [name] [name]. 10/04/2021 11:15 A M Medical Record Number: [id number] Patient Account Number: [id number] Date of Birth/Sex: Treating RN: 25-Jan-1926 (38 y.o. [name] [name] Primary Care Kebrina Friend: [name] Other Clinician: Referring Marbin Olshefski: Treating Sahily Biddle/Extender: Eli Hose in Treatment: 71 Active Problems Location of Pain Severity and Description of Pain Patient Has Paino Yes Site Locations Pain Location: Generalized Pain Rate the pain. Current Pain Level: 4 Pain Management and Medication Current Pain Management: Electronic Signature(s) Signed: 10/04/2021 4:20:32 PM By: Erenest Blank Signed: 10/05/2021  5:16:03 PM By: Deon Pilling RN, BSN Entered By: Erenest Blank on 10/04/2021 11:41:28 -------------------------------------------------------------------------------- Patient/Caregiver Education Details Patient Name: Date of Service: [name] [name]. 10/4/2023andnbsp11:15 A M Medical Record Number: [id number] Patient Account Number: [id number] Date of Birth/Gender: Treating RN: 10-30-1926 (88 y.o. [name] [name] Primary Care Physician: [name] Other Clinician: Referring Physician: Treating Physician/Extender: Eli Hose in Treatment: 20 Education Assessment Education Provided To: Patient Education Topics Provided Safety: Handouts: Medication Safety Methods: Explain/Verbal Responses: Reinforcements needed Electronic Signature(s) Signed: 10/05/2021 5:16:03 PM By: Deon Pilling RN, BSN Entered By: Deon Pilling on 10/04/2021 11:58:24 -------------------------------------------------------------------------------- Wound Assessment Details Patient Name: Date of Service: [name] [name], [name] [name]. 10/04/2021 11:15 A M Medical Record Number: [id number] Patient Account Number: [id number] Date of Birth/Sex: Treating RN: 12/30/1926 (88 y.o. [name], [name] Primary Care Leovanni Bjorkman: [name] Other Clinician: Referring Marvene Strohm: Treating Adell Koval/Extender: Eli Hose in Treatment: 4 Wound Status Wound Number: 14 Primary Open Surgical Wound Etiology: Wound Location: Left, Anterior Lower Leg Wound Open Wounding Event: Surgical Injury Status: Date Acquired: 05/26/2021 Comorbid Cataracts, Sleep Apnea, Arrhythmia, Congestive Heart Failure, Weeks Of Treatment: 13 History: Coronary Artery Disease, Hypertension, Myocardial Infarction, Clustered Wound: No End Stage Renal Disease, Gout, Dementia, Neuropathy Photos Wound Measurements Length: (cm) 2 Width: (cm) 2.2 Depth: (cm) 0.2 Area: (cm) 3.456 Volume: (cm) 0.691 %  Reduction in Area: 12% % Reduction in Volume: 12% Epithelialization: Medium (34-66%) Tunneling: No Undermining: No Wound Description Classification: Full Thickness Without Exposed Support Structures Wound Margin: Epibole Exudate Amount: Medium Exudate Type: Serosanguineous Exudate Color: red, brown Foul Odor After Cleansing: No Slough/Fibrino Yes Wound Bed Granulation Amount: Large (67-100%) Exposed Structure Granulation Quality: Red, Hyper-granulation, Friable Fascia Exposed: No Necrotic Amount: Small (1-33%) Fat Layer (Subcutaneous Tissue) Exposed: Yes Necrotic Quality: Adherent Slough Tendon Exposed: No Muscle Exposed: No Joint Exposed: No Bone Exposed: No Periwound Skin Texture Texture Color No Abnormalities Noted: Yes No Abnormalities Noted: No Atrophie Blanche: No Atrophie Blanche: No Moisture Cyanosis: No No Abnormalities Noted: No Ecchymosis: No Dry / Scaly: No Erythema: No Maceration: Yes Hemosiderin Staining: No Mottled: No Pallor: No Rubor: No Temperature / Pain Temperature: No Abnormality Treatment Notes Wound #14 (Lower Leg) Wound Laterality: Left, Anterior Cleanser Soap and Water Discharge Instruction: May shower  and wash wound with dial antibacterial soap and water prior to dressing change. Peri-Wound Care Skin Prep Discharge Instruction: Use skin prep as directed Topical Primary Dressing Promogran Prisma Matrix, 4.34 (sq in) (silver collagen) Discharge Instruction: Moisten collagen with hydrogel or KY jelly. Secondary Dressing Woven Gauze Sponge, Non-Sterile 4x4 in Discharge Instruction: Apply over primary dressing as directed. Secured With [employer] 4x5 (in/yd) Discharge Instruction: Secure with [employer] ONLY ONE LAYER AROUND LEG.**** Kerlix Roll Sterile, 4.5x3.1 (in/yd) Discharge Instruction: Secure with Kerlix as directed. 76M Medipore H Soft Cloth Surgical T ape, 4 x 10 (in/yd) Discharge Instruction: Secure with tape as  directed. Compression Wrap Compression Stockings Add-Ons Electronic Signature(s) Signed: 10/04/2021 4:20:32 PM By: Erenest Blank Signed: 10/05/2021 5:16:03 PM By: Deon Pilling RN, BSN Entered By: Erenest Blank on 10/04/2021 11:53:18 -------------------------------------------------------------------------------- Wound Assessment Details Patient Name: Date of Service: [name] [name], [name] [name]. 10/04/2021 11:15 A M Medical Record Number: [id number] Patient Account Number: [id number] Date of Birth/Sex: Treating RN: [birth year] (88 y.o. [name], [name] Primary Care Jerame Hedding: [name] Other Clinician: Referring Martiza Speth: Treating Rylan Bernard/Extender: Eli Hose in Treatment: 17 Wound Status Wound Number: 15 Primary Pressure Ulcer Etiology: Wound Location: Right Calcaneus Secondary Abrasion Wounding Event: Trauma Etiology: Date Acquired: 07/12/2021 Wound Open Weeks Of Treatment: 11 Status: Status: Clustered Wound: No Comorbid Cataracts, Sleep Apnea, Arrhythmia, Congestive Heart Failure, History: Coronary Artery Disease, Hypertension, Myocardial Infarction, End Stage Renal Disease, Gout, Dementia, Neuropathy Photos Wound Measurements Length: (cm) 1.4 Width: (cm) 2.8 Depth: (cm) 0.1 Area: (cm) 3.079 Volume: (cm) 0.308 % Reduction in Area: 71.5% % Reduction in Volume: 71.5% Epithelialization: Small (1-33%) Tunneling: No Undermining: No Wound Description Classification: Category/Stage III Wound Margin: Distinct, outline attached Exudate Amount: Medium Exudate Type: Serosanguineous Exudate Color: red, brown Foul Odor After Cleansing: No Slough/Fibrino Yes Wound Bed Granulation Amount: Medium (34-66%) Exposed Structure Granulation Quality: Red, Pink Fascia Exposed: No Necrotic Amount: Small (1-33%) Fat Layer (Subcutaneous Tissue) Exposed: Yes Necrotic Quality: Eschar, Adherent Slough Tendon Exposed: No Muscle Exposed: No Joint Exposed:  No Bone Exposed: No Periwound Skin Texture Texture Color No Abnormalities Noted: Yes No Abnormalities Noted: Yes Moisture Temperature / Pain No Abnormalities Noted: Yes Temperature: No Abnormality Treatment Notes Wound #15 (Calcaneus) Wound Laterality: Right Cleanser Soap and Water Discharge Instruction: May shower and wash wound with dial antibacterial soap and water prior to dressing change. Wound Cleanser Discharge Instruction: Cleanse the wound with wound cleanser prior to applying a clean dressing using gauze sponges, not tissue or cotton balls. Peri-Wound Care Topical Primary Dressing Xeroform Occlusive Gauze Dressing, 4x4 in Discharge Instruction: Apply to wound bed as instructed Secondary Dressing ALLEVYN Heel 4 1/2in x 5 1/2in / 10.5cm x 13.5cm Discharge Instruction: Apply over primary dressing as directed. Woven Gauze Sponge, Non-Sterile 4x4 in Discharge Instruction: ***PLEASE SEND BULK.***Apply over primary dressing as directed. Secured With [employer], 4.5x3.1 (in/yd) Discharge Instruction: Secure with Kerlix as directed. Compression Wrap Compression Stockings Add-Ons Electronic Signature(s) Signed: 10/04/2021 4:20:32 PM By: Erenest Blank Signed: 10/05/2021 5:16:03 PM By: Deon Pilling RN, BSN Entered By: Erenest Blank on 10/04/2021 11:54:21 -------------------------------------------------------------------------------- Wound Assessment Details Patient Name: Date of Service: [name] [name], [name] [name]. 10/04/2021 11:15 A M Medical Record Number: [id number] Patient Account Number: [id number] Date of Birth/Sex: Treating RN: 09-Jan-1926 (88 y.o. [name] [name] Primary Care Jorden Mahl: [name] Other Clinician: Referring Osten Janek: Treating Lycan Davee/Extender: [name] in Treatment: 18 Wound Status Wound Number:  19 Primary Abrasion Etiology: Wound Location: Left, Medial Lower Leg Wound Open Wounding Event: Gradually  Appeared Status: Date Acquired: 09/06/2021 Comorbid Cataracts, Sleep Apnea, Arrhythmia, Congestive Heart Failure, Weeks Of Treatment: 3 History: Coronary Artery Disease, Hypertension, Myocardial Infarction, Clustered Wound: No End Stage Renal Disease, Gout, Dementia, Neuropathy Photos Wound Measurements Length: (cm) Width: (cm) Depth: (cm) Area: (cm) Volume: (cm) 0 % Reduction in Area: 100% 0 % Reduction in Volume: 100% 0 Epithelialization: Large (67-100%) 0 Tunneling: No 0 Undermining: No Wound Description Classification: Full Thickness Without Exposed Support Structures Wound Margin: Distinct, outline attached Exudate Amount: Medium Exudate Type: Serosanguineous Exudate Color: red, brown Foul Odor After Cleansing: No Slough/Fibrino No Wound Bed Granulation Amount: None Present (0%) Exposed Structure Necrotic Amount: None Present (0%) Fascia Exposed: No Fat Layer (Subcutaneous Tissue) Exposed: No Tendon Exposed: No Muscle Exposed: No Joint Exposed: No Bone Exposed: No Periwound Skin Texture Texture Color No Abnormalities Noted: No No Abnormalities Noted: No Callus: No Atrophie Blanche: No Crepitus: No Cyanosis: No Excoriation: No Ecchymosis: No Induration: No Erythema: No Rash: No Hemosiderin Staining: No Scarring: No Mottled: No Pallor: No Moisture Rubor: No No Abnormalities Noted: No Dry / Scaly: No Maceration: No Electronic Signature(s) Signed: 10/04/2021 4:20:32 PM By: Erenest Blank Signed: 10/05/2021 5:16:03 PM By: Deon Pilling RN, BSN Entered By: Erenest Blank on 10/04/2021 11:55:09 -------------------------------------------------------------------------------- Wound Assessment Details Patient Name: Date of Service: [name] [name], [name] [name]. 10/04/2021 11:15 A M Medical Record Number: [id number] Patient Account Number: [id number] Date of Birth/Sex: Treating RN: [birth year] (88 y.o. [name], [name] Primary Care Danner Paulding: [name] Other  Clinician: Referring Steven Basso: Treating Jensyn Cambria/Extender: Eli Hose in Treatment: 39 Wound Status Wound Number: 20 Primary Abrasion Etiology: Wound Location: Left T Second oe Wound Open Wounding Event: Trauma Status: Date Acquired: 09/06/2021 Comorbid Cataracts, Sleep Apnea, Arrhythmia, Congestive Heart Failure, Weeks Of Treatment: 2 History: Coronary Artery Disease, Hypertension, Myocardial Infarction, Clustered Wound: No End Stage Renal Disease, Gout, Dementia, Neuropathy Photos Wound Measurements Length: (cm) 1.6 Width: (cm) 1.5 Depth: (cm) 0.1 Area: (cm) 1.885 Volume: (cm) 0.188 % Reduction in Area: -1100.6% % Reduction in Volume: -1075% Epithelialization: None Tunneling: No Undermining: No Wound Description Classification: Full Thickness Without Exposed Support Structur Wound Margin: Distinct, outline attached Exudate Amount: Small Exudate Type: Serosanguineous Exudate Color: red, brown Wound Bed Granulation Amount: None Present (0%) Necrotic Amount: Large (67-100%) Necrotic Quality: Adherent Slough es Foul Odor After Cleansing: No Slough/Fibrino Yes Exposed Structure Fascia Exposed: No Fat Layer (Subcutaneous Tissue) Exposed: No Tendon Exposed: No Muscle Exposed: No Joint Exposed: No Bone Exposed: No Periwound Skin Texture Texture Color No Abnormalities Noted: No No Abnormalities Noted: Yes Callus: No Crepitus: No Excoriation: No Induration: No Rash: No Scarring: No Moisture No Abnormalities Noted: No Dry / Scaly: No Maceration: No Treatment Notes Wound #20 (Toe Second) Wound Laterality: Left Cleanser Soap and Water Discharge Instruction: May shower and wash wound with dial antibacterial soap and water prior to dressing change. Wound Cleanser Discharge Instruction: Cleanse the wound with wound cleanser prior to applying a clean dressing using gauze sponges, not tissue or cotton balls. Peri-Wound  Care Topical Primary Dressing Iodosorb Gel 10 (gm) Tube Discharge Instruction: Apply idosorb or iodoflex to wound bed as instructed Secondary Dressing Woven Gauze Sponges 2x2 in Discharge Instruction: Apply over primary dressing as directed. Secured With [employer] Size 2, 10 (yds) Compression Wrap Compression Stockings Add-Ons Electronic Signature(s) Signed: 10/04/2021 4:20:32 PM By: Erenest Blank Signed: 10/05/2021 5:16:03 PM  By: Deon Pilling RN, BSN Entered By: Erenest Blank on 10/04/2021 11:55:59 -------------------------------------------------------------------------------- Wound Assessment Details Patient Name: Date of Service: [name], [name] [name]. 10/04/2021 11:15 A M Medical Record Number: [id number] Patient Account Number: [id number] Date of Birth/Sex: Treating RN: 1926-09-27 (88 y.o. [name], [name] Primary Care Ysidro Ramsay: [name] Other Clinician: Referring Orell Hurtado: Treating Jestin Burbach/Extender: Eli Hose in Treatment: 62 Wound Status Wound Number: 21 Primary Abrasion Etiology: Wound Location: Right T Second oe Wound Open Wounding Event: Trauma Status: Date Acquired: 09/13/2021 Comorbid Cataracts, Sleep Apnea, Arrhythmia, Congestive Heart Failure, Weeks Of Treatment: 2 History: Coronary Artery Disease, Hypertension, Myocardial Infarction, Clustered Wound: No End Stage Renal Disease, Gout, Dementia, Neuropathy Photos Wound Measurements Length: (cm) 0.3 Width: (cm) 0.5 Depth: (cm) 0.2 Area: (cm) 0.118 Volume: (cm) 0.024 % Reduction in Area: 24.8% % Reduction in Volume: -50% Epithelialization: None Tunneling: No Undermining: No Wound Description Classification: Full Thickness Without Exposed Support Structures Wound Margin: Distinct, outline attached Exudate Amount: Medium Exudate Type: Serosanguineous Exudate Color: red, brown Foul Odor After Cleansing: No Slough/Fibrino Yes Wound Bed Granulation Amount: Small  (1-33%) Exposed Structure Granulation Quality: Pink Fascia Exposed: No Necrotic Amount: Large (67-100%) Fat Layer (Subcutaneous Tissue) Exposed: Yes Necrotic Quality: Adherent Slough Tendon Exposed: No Muscle Exposed: No Joint Exposed: No Bone Exposed: No Periwound Skin Texture Texture Color No Abnormalities Noted: No No Abnormalities Noted: No Callus: No Atrophie Blanche: No Crepitus: No Cyanosis: No Excoriation: No Ecchymosis: Yes Induration: No Erythema: No Rash: No Hemosiderin Staining: No Scarring: No Mottled: No Pallor: No Moisture Rubor: No No Abnormalities Noted: No Dry / Scaly: No Maceration: No Treatment Notes Wound #21 (Toe Second) Wound Laterality: Right Cleanser Soap and Water Discharge Instruction: May shower and wash wound with dial antibacterial soap and water prior to dressing change. Wound Cleanser Discharge Instruction: Cleanse the wound with wound cleanser prior to applying a clean dressing using gauze sponges, not tissue or cotton balls. Peri-Wound Care Topical Primary Dressing Iodosorb Gel 10 (gm) Tube Discharge Instruction: Apply idosorb or iodoflex to wound bed as instructed Secondary Dressing Woven Gauze Sponges 2x2 in Discharge Instruction: Apply over primary dressing as directed. Secured With [employer] Size 2, 10 (yds) Compression Wrap Compression Stockings Add-Ons Electronic Signature(s) Signed: 10/04/2021 4:20:32 PM By: Erenest Blank Signed: 10/05/2021 5:16:03 PM By: Deon Pilling RN, BSN Entered By: Erenest Blank on 10/04/2021 11:56:37 -------------------------------------------------------------------------------- Wound Assessment Details Patient Name: Date of Service: [name] [name], [name] [name]. 10/04/2021 11:15 A M Medical Record Number: [id number] Patient Account Number: [id number] Date of Birth/Sex: Treating RN: 12-05-1926 (88 y.o. [name], [name] Primary Care Marcques Wrightsman: [name] Other Clinician: Referring  Jozlynn Plaia: Treating Emmer Lillibridge/Extender: Eli Hose in Treatment: 73 Wound Status Wound Number: 22 Primary Lymphedema Etiology: Wound Location: Right Achilles Wound Open Wounding Event: Gradually Appeared Status: Date Acquired: 09/27/2021 Comorbid Cataracts, Sleep Apnea, Arrhythmia, Congestive Heart Failure, Weeks Of Treatment: 0 History: Coronary Artery Disease, Hypertension, Myocardial Infarction, Clustered Wound: No End Stage Renal Disease, Gout, Dementia, Neuropathy Photos Wound Measurements Length: (cm) 1 Width: (cm) 1 Depth: (cm) 0.1 Area: (cm) 0.785 Volume: (cm) 0.079 % Reduction in Area: % Reduction in Volume: Epithelialization: Small (1-33%) Tunneling: No Undermining: No Wound Description Classification: Full Thickness Without Exposed Support Structures Wound Margin: Distinct, outline attached Exudate Amount: Medium Exudate Type: Serosanguineous Exudate Color: red, brown Foul Odor After Cleansing: No Slough/Fibrino Yes Wound Bed Granulation Amount: Medium (34-66%) Exposed Structure Granulation Quality: Red, Pink  Fascia Exposed: No Fat Layer (Subcutaneous Tissue) Exposed: Yes Tendon Exposed: No Muscle Exposed: No Joint Exposed: No Bone Exposed: No Periwound Skin Texture Texture Color No Abnormalities Noted: No No Abnormalities Noted: No Callus: No Atrophie Blanche: No Crepitus: No Cyanosis: No Excoriation: No Ecchymosis: No Induration: No Erythema: No Rash: No Hemosiderin Staining: No Scarring: No Mottled: No Pallor: No Moisture Rubor: No No Abnormalities Noted: No Dry / Scaly: No Temperature / Pain Maceration: No Temperature: No Abnormality Treatment Notes Wound #22 (Achilles) Wound Laterality: Right Cleanser Soap and Water Discharge Instruction: May shower and wash wound with dial antibacterial soap and water prior to dressing change. Wound Cleanser Discharge Instruction: Cleanse the wound with wound cleanser  prior to applying a clean dressing using gauze sponges, not tissue or cotton balls. Peri-Wound Care Topical Primary Dressing Secondary Dressing ABD Pad, 5x9 Discharge Instruction: Apply over primary dressing as directed. Secured With [employer] 4x5 (in/yd) Discharge Instruction: Secure with Coban as directed. Kerlix Roll Sterile, 4.5x3.1 (in/yd) Discharge Instruction: Secure with Kerlix as directed. Compression Wrap Compression Stockings Add-Ons Electronic Signature(s) Signed: 10/04/2021 4:20:32 PM By: Erenest Blank Signed: 10/05/2021 5:16:03 PM By: Deon Pilling RN, BSN Entered By: Erenest Blank on 10/04/2021 12:04:39 -------------------------------------------------------------------------------- Wound Assessment Details Patient Name: Date of Service: [name] [name], [name] [name]. 10/04/2021 11:15 A M Medical Record Number: [id number] Patient Account Number: [id number] Date of Birth/Sex: Treating RN: 25-Jul-1926 (88 y.o. [name], [name] Primary Care Kani Jobson: [name] Other Clinician: Referring Casee Knepp: Treating Eulalah Rupert/Extender: Eli Hose in Treatment: 59 Wound Status Wound Number: 3 Primary Venous Leg Ulcer Etiology: Wound Location: Left, Lateral Ankle Wound Open Wounding Event: Gradually Appeared Status: Date Acquired: 10/12/2020 Comorbid Cataracts, Sleep Apnea, Arrhythmia, Congestive Heart Failure, Weeks Of Treatment: 46 History: Coronary Artery Disease, Hypertension, Myocardial Infarction, Clustered Wound: No End Stage Renal Disease, Gout, Dementia, Neuropathy Photos Wound Measurements Length: (cm) 0.7 Width: (cm) 0.5 Depth: (cm) 0.1 Area: (cm) 0.275 Volume: (cm) 0.027 % Reduction in Area: 45.3% % Reduction in Volume: 82.1% Epithelialization: Medium (34-66%) Tunneling: No Undermining: No Wound Description Classification: Full Thickness Without Exposed Support Structures Wound Margin: Epibole Exudate Amount:  Medium Exudate Type: Serosanguineous Exudate Color: red, brown Foul Odor After Cleansing: No Slough/Fibrino No Wound Bed Granulation Amount: Large (67-100%) Exposed Structure Granulation Quality: Red Fascia Exposed: No Necrotic Amount: None Present (0%) Fat Layer (Subcutaneous Tissue) Exposed: Yes Tendon Exposed: No Muscle Exposed: No Joint Exposed: No Bone Exposed: No Periwound Skin Texture Texture Color No Abnormalities Noted: No No Abnormalities Noted: No Callus: No Atrophie Blanche: No Crepitus: No Cyanosis: No Excoriation: No Ecchymosis: No Induration: No Erythema: No Rash: No Hemosiderin Staining: No Scarring: No Mottled: No Pallor: No Moisture Rubor: No No Abnormalities Noted: No Dry / Scaly: No Maceration: No Treatment Notes Wound #3 (Ankle) Wound Laterality: Left, Lateral Cleanser Soap and Water Discharge Instruction: May shower and wash wound with dial antibacterial soap and water prior to dressing change. Peri-Wound Care Skin Prep Discharge Instruction: Use skin prep as directed Topical Primary Dressing Promogran Prisma Matrix, 4.34 (sq in) (silver collagen) Discharge Instruction: Moisten collagen with hydrogel or KY jelly. Secondary Dressing Woven Gauze Sponge, Non-Sterile 4x4 in Discharge Instruction: Apply over primary dressing as directed. Secured With [employer] 4x5 (in/yd) Discharge Instruction: Secure with Coban ONLY ONE LAYER AROUND LEG.**** Kerlix Roll Sterile, 4.5x3.1 (in/yd) Discharge Instruction: Secure with Kerlix as directed. 74M Medipore H Soft Cloth Surgical T ape, 4 x 10 (in/yd) Discharge Instruction: Secure  with tape as directed. Compression Wrap Compression Stockings Add-Ons Electronic Signature(s) Signed: 10/04/2021 4:20:32 PM By: Erenest Blank Signed: 10/05/2021 5:16:03 PM By: Deon Pilling RN, BSN Entered By: Erenest Blank on 10/04/2021  11:57:09 -------------------------------------------------------------------------------- Vitals Details Patient Name: Date of Service: [name] [name], [name] [name]. 10/04/2021 11:15 A M Medical Record Number: [id number] Patient Account Number: [id number] Date of Birth/Sex: Treating RN: 05/07/1926 (88 y.o. [name], [name] Primary Care Addylin Manke: [name] Other Clinician: Referring Gracy Ehly: Treating Brinley Treanor/Extender: Eli Hose in Treatment: 58 Vital Signs Time Taken: 11:41 Temperature (F): 97.9 Height (in): 72 Pulse (bpm): 79 Weight (lbs): 223 Respiratory Rate (breaths/min): 18 Body Mass Index (BMI): 30.2 Blood Pressure (mmHg): 128/63 Reference Range: 80 - 120 mg / dl Electronic Signature(s) Signed: 10/04/2021 4:20:32 PM By: Erenest Blank Entered By: Erenest Blank on 10/04/2021 11:41:46

## 2021-10-07 DIAGNOSIS — Z23 Encounter for immunization: Secondary | ICD-10-CM | POA: Diagnosis not present

## 2021-10-09 ENCOUNTER — Ambulatory Visit: Payer: Medicare Other

## 2021-10-09 ENCOUNTER — Ambulatory Visit: Payer: Medicare Other | Admitting: Occupational Therapy

## 2021-10-09 DIAGNOSIS — R262 Difficulty in walking, not elsewhere classified: Secondary | ICD-10-CM | POA: Diagnosis not present

## 2021-10-09 DIAGNOSIS — R2681 Unsteadiness on feet: Secondary | ICD-10-CM

## 2021-10-09 DIAGNOSIS — R41840 Attention and concentration deficit: Secondary | ICD-10-CM

## 2021-10-09 DIAGNOSIS — M6281 Muscle weakness (generalized): Secondary | ICD-10-CM | POA: Diagnosis not present

## 2021-10-09 DIAGNOSIS — I69351 Hemiplegia and hemiparesis following cerebral infarction affecting right dominant side: Secondary | ICD-10-CM

## 2021-10-09 DIAGNOSIS — R4701 Aphasia: Secondary | ICD-10-CM | POA: Diagnosis not present

## 2021-10-09 DIAGNOSIS — R2689 Other abnormalities of gait and mobility: Secondary | ICD-10-CM | POA: Diagnosis not present

## 2021-10-09 DIAGNOSIS — R208 Other disturbances of skin sensation: Secondary | ICD-10-CM | POA: Diagnosis not present

## 2021-10-09 DIAGNOSIS — R41841 Cognitive communication deficit: Secondary | ICD-10-CM | POA: Diagnosis not present

## 2021-10-09 NOTE — Therapy (Signed)
OUTPATIENT OCCUPATIONAL THERAPY  TREATMENT NOTE &PROGRESS NOTE  Patient Name: [name] [name] MRN: [id number] DOB:1926/02/26, 88 y.o., male Today's Date: 10/09/2021  PCP: [name], MD REFERRING PROVIDER: Charlett Blake, MD   Occupational Therapy Progress Note  Dates of Reporting Period: 08/29/21 to 10/09/21  Objective Reports of Subjective Statement: Pt has demonstrated improvements in fine motor coordination on 9 hole peg test and legibility with handwriting. Pt continues to demonstrate difficulty with spelling, frequently making errors with check balancing task and when inputing passwords into the computer, largely due to aphasia.   Reason Skilled Services are Required: Pt continues to demonstrate difficulty with LB dressing, functional transfers in/out of shower and toilet.  Pt continues to express desire to address typing and handwriting from a spelling, speed and legibility, and dual tasking stand point.    OT End of Session - 10/09/21 1354     Visit Number 10    Number of Visits 17    Date for OT Re-Evaluation 10/27/21    Authorization Type BCBS Medicare    OT Start Time 1316    OT Stop Time 1358    OT Time Calculation (min) 42 min    Activity Tolerance Patient tolerated treatment well    Behavior During Therapy WFL for tasks assessed/performed             Past Medical History:  Diagnosis Date   AAA (abdominal aortic aneurysm) ([place])    Arthritis    Ascending aorta dilatation ([place]) 03/13/2012   Fusiform dilatation of the ascending thoracic aorta discovered during surgery, max diameter 4.2-4.3 cm   BPH (benign prostatic hypertrophy)    Chronic systolic heart failure ([place]) 03/03/2012   Chronic venous insufficiency    GERD (gastroesophageal reflux disease)    Glaucoma    Gout    History of kidney stones    Hyperlipidemia    Hypertension    Obesity (BMI 30-39.9)    Paroxysmal atrial fibrillation ([place]) 02/29/2012   Recurrent paroxysmal, new-onset    PMR  (polymyalgia rheumatica) (HCC)    Polymyalgia rheumatica ([place]) 02/06/2019   Pre-diabetes    S/P CABG x 3 03/06/2012   LIMA to LAD, SVG to OM, SVG to RCA, EVH via right thigh   S/P Maze operation for atrial fibrillation 03/06/2012   Complete bilateral lesions set using bipolar radiofrequency and cryothermy ablation with clipping of LA appendage   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   s/p TAVR with a 26 mm Edwards via the left subclavian by Drs Buena Irish and Bartle   Severe aortic stenosis 09/29/2019   Sleep apnea    USES CPAP NIGHTLY   Past Surgical History:  Procedure Laterality Date   ABDOMINAL AORTOGRAM W/LOWER EXTREMITY N/A 10/26/2020   Procedure: ABDOMINAL AORTOGRAM W/LOWER EXTREMITY;  Surgeon: Broadus John, MD;  Location: [facility];  Service: Cardiovascular;  Laterality: N/A;   CARDIAC CATHETERIZATION     CATARACT EXTRACTION, BILATERAL     with lens implants   CHOLECYSTECTOMY N/A 03/24/2012   Procedure: LAPAROSCOPIC CHOLECYSTECTOMY WITH INTRAOPERATIVE CHOLANGIOGRAM;  Surgeon: Zenovia Jarred, MD;  Location: [facility];  Service: General;  Laterality: N/A;   CORONARY ARTERY BYPASS GRAFT N/A 03/06/2012   Procedure: CORONARY ARTERY BYPASS GRAFTING (CABG);  Surgeon: Rexene Alberts, MD;  Location: [facility];  Service: Open Heart Surgery;  Laterality: N/A;   ENDOVEIN HARVEST OF GREATER SAPHENOUS VEIN Right 03/06/2012   Procedure: ENDOVEIN HARVEST OF GREATER SAPHENOUS VEIN;  Surgeon: Rexene Alberts,  MD;  Location: [facility];  Service: Open Heart Surgery;  Laterality: Right;   INTRAOPERATIVE TRANSESOPHAGEAL ECHOCARDIOGRAM N/A 03/06/2012   Procedure: INTRAOPERATIVE TRANSESOPHAGEAL ECHOCARDIOGRAM;  Surgeon: Rexene Alberts, MD;  Location: [facility];  Service: Open Heart Surgery;  Laterality: N/A;   IR KYPHO LUMBAR INC FX REDUCE BONE BX UNI/BIL CANNULATION INC/IMAGING  02/23/2020   LEFT HEART CATH Right 03/02/2012   Procedure: LEFT HEART CATH;  Surgeon: Sherren Mocha, MD;   Location: [facility];  Service: Cardiovascular;  Laterality: Right;   MAZE N/A 03/06/2012   Procedure: MAZE;  Surgeon: Rexene Alberts, MD;  Location: [facility];  Service: Open Heart Surgery;  Laterality: N/A;   PERIPHERAL VASCULAR INTERVENTION Right 10/26/2020   Procedure: PERIPHERAL VASCULAR INTERVENTION;  Surgeon: Broadus John, MD;  Location: [facility];  Service: Cardiovascular;  Laterality: Right;   PROSTATECTOMY     partial   RIGHT/LEFT HEART CATH AND CORONARY/GRAFT ANGIOGRAPHY N/A 09/29/2019   Procedure: RIGHT/LEFT HEART CATH AND CORONARY/GRAFT ANGIOGRAPHY;  Surgeon: Adrian Prows, MD;  Location: [facility];  Service: Cardiovascular;  Laterality: N/A;   TEE WITHOUT CARDIOVERSION N/A 11/10/2019   Procedure: TRANSESOPHAGEAL ECHOCARDIOGRAM (TEE);  Surgeon: Burnell Blanks, MD;  Location: [facility] [facility];  Service: Open Heart Surgery;  Laterality: N/A;   TRANSCATHETER AORTIC VALVE REPLACEMENT, TRANSFEMORAL  11/10/2019   Patient Active Problem List   Diagnosis Date Noted   Left middle cerebral artery stroke ([place]) 05/31/2021   Acute CVA (cerebrovascular accident) ([place]) 05/25/2021   DNR (do not resuscitate) 05/25/2021   Osteoporosis 05/05/2021   Gouty arthritis 02/13/2021   Morbid obesity ([place]) 02/13/2021   Abdominal aortic aneurysm without rupture ([place]) 11/22/2020   Acute renal failure syndrome ([place]) 11/22/2020   Low back pain 11/22/2020   Skin tear of right upper arm without complication 33/35/4562   Pressure injury of skin 10/23/2020   Urinary tract infection with hematuria 10/22/2020   Encephalopathy 10/22/2020   AKI (acute kidney injury) ([place])    Overweight (BMI 25.0-29.9) 08/19/2020   Vertebral fracture, osteoporotic ([place]) 08/18/2020   T8 vertebral fracture ([place]) 08/17/2020   Thrombocytopenia ([place]) 08/17/2020   CKD (chronic kidney disease) stage 3, GFR 30-59 ml/min (HCC) 08/17/2020   Elevated troponin 08/17/2020   Pain due to onychomycosis of toenails of both feet  07/27/2020   Callus of heel 07/27/2020   Bifascicular block 11/11/2019   Acute on chronic diastolic heart failure ([place]) 11/10/2019   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   Severe aortic stenosis 09/29/2019   Aortic valve disorder 56/38/9373   Systolic heart failure ([place]) 03/11/2019   Thrombophilia ([place]) 02/26/2019   Muscle pain 02/11/2019   Weakness 02/06/2019   Polymyalgia rheumatica ([place]) 02/06/2019   Shoulder joint pain 02/02/2019   Anemia 01/26/2019   Malignant melanoma of skin ([place]) 01/26/2019   Pain in right leg 01/26/2019   DDD (degenerative disc disease), cervical 05/28/2018   Encounter for general adult medical examination without abnormal findings 11/19/2017   Carotid artery occlusion 07/03/2017   Hearing loss 07/03/2017   Fall 06/19/2017   Injury of upper extremity 06/19/2017   Neck pain 06/19/2017   Headache 06/19/2017   Visual disturbance 06/19/2017   Ganglion of hand 05/08/2017   Dilatation of aorta ([place]) 02/14/2017   Plantar fascial fibromatosis 02/14/2017   Cardiac murmur, unspecified 02/27/2016   Benign neoplasm of colon 08/24/2015   Dementia ([place]) 08/24/2015   Influenza due to unidentified influenza virus with other respiratory manifestations 03/15/2015  Epistaxis 12/01/2014   OSA (obstructive sleep apnea) 11/18/2014   Gastro-esophageal reflux disease with esophagitis 10/29/2014   Angina pectoris ([place]) 05/04/2014   Abnormal feces 07/31/2013   Other specified disorders of gingiva and edentulous alveolar ridge 07/31/2013   Spontaneous ecchymosis 10/10/2012   Thoracic ascending aortic aneurysm ([place]) 09/29/2012   Microscopic hematuria 04/11/2012   Proteinuria 04/11/2012   S/P laparoscopic cholecystectomy 04/09/2012   S/P CABG x 3 03/06/2012   S/P Maze operation for atrial fibrillation 03/06/2012   Paroxysmal atrial fibrillation ([place]) 02/29/2012   Bitten or stung by nonvenomous insect and other nonvenomous arthropods, initial  encounter 12/03/2011   Depression screening 10/17/2011   CAD (coronary artery disease), native coronary artery    Hyperlipidemia    Hypertension    Chronic venous insufficiency    Gout    Benign prostatic hyperplasia    Post-traumatic stress disorder 03/13/2011   Pain in left leg 12/08/2010   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   Fatigue 08/08/2010   Retention of urine 04/18/2010   Constipation 07/05/2009   Kidney stone 07/05/2009   ED (erectile dysfunction) of organic origin 12/14/2008   Impaired fasting glucose 12/14/2008   Vitamin B deficiency 12/14/2008    ONSET DATE: 05/25/2021  REFERRING DIAG: J49.702 (ICD-10-CM) - Left middle cerebral artery stroke   THERAPY DIAG:  Hemiplegia and hemiparesis following cerebral infarction affecting right dominant side (HCC)  Muscle weakness (generalized)  Other disturbances of skin sensation  Unsteadiness on feet  Attention and concentration deficit  Rationale for Evaluation and Treatment Rehabilitation  SUBJECTIVE:   SUBJECTIVE STATEMENT: Pt reports he had a fall Thursday when he was transferring from his electric w/c to standard w/c and fell to the right.  He reports that they had to call EMS to get him up.   Pt accompanied by: self and significant other (dropped off pt)  PERTINENT HISTORY:  H/o AAA, CAD with CABG, compression fractures, chronic anemia followed by hematology services, chronic systolic congestive heart failure, A-fib/AF status post MAZE on Xarelto, hypertension, hyperlipidemia, PMR maintained on steroids, prediabetes, early dementia maintained on Namenda, chronic lower extremity wounds with Unna boot OSA with CPAP, PAD with lymphedema status post balloon angioplasty of posterior tibial artery October 2022 recent UTI placed on antibiotic therapy.  He does have a personal care attendant Monday Wednesday Friday for 3 hours.  Modified independent transfers power wheelchair bound x3 to 4 months prior to that ambulating  short distances with a rollator.  Present 05/25/2021 with right-sided weakness and aphasia of acute onset.  Cranial CT scan showed small left MCA territory cortical infarct within the mid to posterior left frontal lobe. CT angiogram head and neck no large vessel occlusion.   PRECAUTIONS: Fall  PAIN:  Are you having pain? Yes: NPRS scale: 4/10 Pain location: right side (post fall) Pain description: constant; sore Aggravating factors: standing, certain movements/exercises Relieving factors: injections, flat on back  FALLS: Has patient fallen in last 6 months? Yes. Number of falls - 4, but has not fallen recently, per pt report  LIVING ENVIRONMENT: Lives with: lives with their spouse Lives in: House/apartment Stairs:  Yes: steps on front and back of house, however has an elevator at the back, steps to 2nd floor but does not need to go upstairs Has following equipment at home: Gilford Rile - 2 wheeled, Environmental consultant - 4 wheeled, Wheelchair (power), Wheelchair (manual), shower chair, bed side commode, Grab bars, and elevator to access home from outside, hand held shower head  PLOF:  Has a caregiver  3x/week that assists with ADLs, d/t spinal compression fx pt has depended more on w/c in 5-6 months  PATIENT GOALS: to talk properly; to write and to spell   OBJECTIVE:   HAND DOMINANCE: Right  ADLs: Dressing: Setup w/ UB dressing, wife assists w/ pants, socks, shoes Grooming: Mod I from seated position Eating: reports great difficulty using a knife to cut food, wife frequently cuts foods for him Toileting: Occ difficulty pulling pants back up after toileting, leans forward to complete hygiene Bathing: Supervision/setup  Tub Shower transfers: caregiver assist w/ transfers in/out of shower providing SPV and occ Min A Equipment: Shower seat with back, Grab bars, Walk in shower, bed side commode, Reacher, and Sock aid  IADLs: Financial management: Reports difficulty with writing a check, math, spelling,  but did most of financial management Handwriting: 50% legible and difficulty with spelling that has gotten worse since CVA  MOBILITY STATUS: Needs Assist: Mod I stand pivot from w/c, Supervision for shower transfers  FUNCTIONAL OUTCOME MEASURES: FOTO: 58 on 09/27/21  UPPER EXTREMITY MMT:    RUE and LUE grossly 4+/5  HAND FUNCTION: Grip strength: Right: 30 lbs; Left: 30 lbs  COORDINATION: 9 Hole Peg test: Right: 1:06.53 sec; Left: 34.53 sec 09/20/21: Right: 56.15 sec  SENSATION: Reports numbness and difficulty maintaining grasp on spoon/fork and writing utensil  -------------------------------------------------------------------------------------------------------------------------------------------------------- (objective measures above completed at initial evaluation unless otherwise dated)  TODAY'S TREATMENT:  10/09/21 ADL: Simulated LB dressing with gait belt.  OT demonstrating technique with use of reacher. Pt stating that he has a reacher but does not typically use it as he feels that he can reach towards his lower legs.  Pt demonstrating ability to don simulated pants with gait belt with close supervision when standing to pull "pants" over hips.  Pt reports/demonstrates that he is able to do it but prefers when he has assistance as it can be "laborious".   ADL: donning shoes.  Pt demonstrating ability to untie and doff shoe with increased effort to reach forward.  Pt utilizing long handled shoe horn to attempt to don shoe.  OT providing initial verbal cue and setup assist for placement of shoe horn to increase success when donning shoe.  Pt demonstrating increased time to tie shoes, due to decreased coordination, but able to complete.  OT demonstrated use of shoe funnel, however would not work with style of shoe pt wearing today.   Typing/Mouse accuracy: OT demonstrating mouse accuracy program and discussing variety of typing programs available online.  OT printed these (see pt  instructions) to allow for pt to practice as he continues to report getting "locked out" of websites due to mistyping passwords.   Simulated shower transfers: sidestepping R and L at counter top with CGA for standing balance.  Pt continues to demonstrate decreased clearance of B feet with sidestepping requiring increased time and effort, still ultimately not clearing enough to step over 3" threshold as needed to access shower this session.     10/04/21 Handwriting: practiced handwriting on standard lined paper w/ good legibility and slightly decr speed and smoothness of lines; OT incorporated history trivia questions to also address attention/cognition and spelling. Pt able to correctly answer questions and spell familiar words w/ min difficulty; required verbal cues (help sounding out words) for spelling about 50% of the time, though some words were inherently difficult (e.g., protestant, [place]). Letter formation, spacing, sizing were all WNL. No letter reversals noted today. FMC/Coordination: picking up 3 marbles one at a time,  translating each to fingertips, and placing on easy-grip pegs to facilitate in-hand manipulation, control, and intrinsic hand strengthening. Completed 20 marbles w/ multiple drops; increased success with decreased speed; used same sequencing/skill to return marbles to container. OT instructed pt to maintain finger adduction as able during in-hand manipulation to prevent marbles from slipping between digits. Self-care/ADLs: education provided/demonstrated on potential benefit of long-handle shoe horn; will trial next session. Also discussed current strategies for shower transfers (pt has walk-in shower); may benefit from practicing simulated transfer to increase safety and develop compensatory/adaptive strategies prn  10/02/21 Coordination: engaged in small peg board pattern replication with RUE.  Pt demonstrating improvements in ability to pick up small pegs, however requiring  intermittent assistance from LUE to assist in manipulating/rotating pegs to place in to place in hole.  Pt demonstrating decreased incorporation of index finger into manipulation of pegs, however with min cues pt demonstrating increased attempts at incorporating.  Pt dropping 3 of 36 pegs and utilizing peg board to reposition hand placement 25% of time.  Removing pegs with increased focus on precision pinch.   Handwriting: engaged in writing out names of foods in alphabetical order with focus on legibility, correct spelling, and word production.  Pt making a list of letters in order and then filling in words as he thinks of them.  Pt initially having difficulty with recalling letters in alphabet post M and then skipping >50% of the words to return to.  Pt utilizing slightly weighted pen for increased proprioceptive input for writing.  Handwriting 90% legible with pt crossing out a few letters during activity.  Discussed utilizing capital letters as needed for increased legibility.     PATIENT EDUCATION: Ongoing condition-specific education related to therapeutic interventions completed this session Person educated: Patient Education method: Explanation and Demonstration Education comprehension: verbalized understanding   HOME EXERCISE PROGRAM: Access Code: [email or web address] URL: [email or web address] Date: 09/11/2021 Prepared by: [facility]  Exercises - Putty Squeezes  - 1 x daily - 1 sets - 10 reps - Rolling Putty on Table  - 1 x daily - 1 sets - 10 reps - Thumb Opposition with Putty  - 1 x daily - 1 sets - 10 reps - Key Pinch with Putty  - 1 x daily - 1 sets - 10 reps - Tip Pinch with Putty  - 1 x daily - 1 sets - 10 reps - Removing Marbles from Putty  - 1 x daily - 1 sets - 10 reps  GOALS: Goals reviewed with patient? Yes  SHORT TERM GOALS: Target date: 09/29/2021  Pt will verbalize understanding of adapted strategies and/or potential AE  needs to increase ease, safety, and independence w/ LB dressing. Baseline: Goal status: NOT MET - 09/27/21 Pt reports still having difficulty with getting pants up/down and on/off. Will benefit from simulated practice but have not to this point due to focus on [facility] and handwriting  2.  Pt will be independent with FMC/GMC and strengthening HEP to increase independence with handwriting and cutting of foods. Baseline:  Goal status: MET - 09/27/21  3. Pt will demonstrate improved fine motor coordination for ADLs as evidenced by decreasing 9 hole peg test score for RUE by 5 secs  Baseline: 9 Hole Peg test: Right: 1:06.53 sec; Left: 34.53 sec  Goal status: MET - 56.15 sec   LONG TERM GOALS: Target date: 10/27/2021  Pt will report ability to complete LB dressing with supervision for standing balance and use of  AE as needed. Baseline:  Goal status: MET - 10/04/21  2.  Pt will demonstrated improved standing balance to stand for 1 min with alternating UE to allow for increased independence with LB dressing/clothing management post toileting. Baseline:  Goal status: IN PROGRESS  3.  Pt will demonstrated improved working memory and recall during check writing/balancing check book by making 0 errors. Baseline:  Goal status: IN PROGRESS  4.  Pt will demonstrate improved fine motor coordination for ADLs as evidenced by decreasing 9 hole peg test score for RUE by 20 secs Baseline: 9 Hole Peg test: Right: 1:06.53 sec; Left: 34.53 sec Upgraded 09/20/21 due to completing in 56.15 sec Goal status: MET - 43.91 seconds on 10/09/21   5.  Pt will write a short paragraph with 90% legibility and no significant errors in spelling as evidenced by </= to 3 spelling errors. Baseline:  Goal status: IN PROGRESS - partly met as Letter formation, spacing, sizing were all WNL, still making spelling errors and requiring cues 50% of time for spelling.   ASSESSMENT:  CLINICAL IMPRESSION: Session today with focus on  self-care retraining, education on AE for LB dressing, and sidestepping to access walk-in shower at home. Pt demonstrating good technique with LB dressing and use of AE when donning shoes.  Pt does continue to require assist with LB dressing due to need for assist with donning compression wraps.  Pt is demonstrating improved BLE mobility in sitting position as needed for LB dressing, however with decreased mobility in standing as needed for toilet and shower transfers.  PERFORMANCE DEFICITS in functional skills including ADLs, IADLs, coordination, sensation, ROM, strength, pain, FMC, GMC, balance, body mechanics, endurance, decreased knowledge of use of DME, and UE functional use, cognitive skills including attention, problem solving, sequencing, and thought, and psychosocial skills including coping strategies, environmental adaptation, and routines and behaviors.   IMPAIRMENTS are limiting patient from ADLs and IADLs.   COMORBIDITIES may have co-morbidities  that affects occupational performance. Patient will benefit from skilled OT to address above impairments and improve overall function.  PLAN: OT FREQUENCY: 2x/week  OT DURATION: 8 weeks  PLANNED INTERVENTIONS: self care/ADL training, therapeutic exercise, therapeutic activity, neuromuscular re-education, balance training, functional mobility training, moist heat, cryotherapy, patient/family education, cognitive remediation/compensation, psychosocial skills training, energy conservation, coping strategies training, and DME and/or AE instructions  RECOMMENDED OTHER SERVICES: N/A  CONSULTED AND AGREED WITH PLAN OF CARE: Patient  PLAN FOR NEXT SESSION: getting up/down from toilet, shower transfers; review coordination HEP if needed, [facility] tasks to address handwriting and other alternating attention tasks, word games/association for aphasia and carryover to spelling   Jozlin Bently, [place], OTR/L 10/09/2021, 1:55 PM

## 2021-10-09 NOTE — Patient Instructions (Signed)
Typing Websites:  [email or web address] [email or web address] [email or web address]   Mouse Accuracy:  [email or web address].com

## 2021-10-09 NOTE — Therapy (Signed)
OUTPATIENT PHYSICAL THERAPY NEURO TREATMENT   Patient Name: [name] [name] MRN: [id number] DOB:18-Jul-1926, 88 y.o., male Today's Date: 10/09/2021   PCP: [name], MD REFERRING PROVIDER: Charlett Blake, MD    PT End of Session - 10/09/21 1406     Visit Number 9    Number of Visits 16    Date for PT Re-Evaluation 11/07/21    Authorization Type BCBS Medicare    Authorization Time Period no VL, no auth    PT Start Time 1400    PT Stop Time 4782    PT Time Calculation (min) 45 min    Equipment Utilized During Treatment Gait belt    Activity Tolerance Patient tolerated treatment well;Patient limited by pain    Behavior During Therapy WFL for tasks assessed/performed                   Past Medical History:  Diagnosis Date   AAA (abdominal aortic aneurysm) ([place])    Arthritis    Ascending aorta dilatation ([place]) 03/13/2012   Fusiform dilatation of the ascending thoracic aorta discovered during surgery, max diameter 4.2-4.3 cm   BPH (benign prostatic hypertrophy)    Chronic systolic heart failure ([place]) 03/03/2012   Chronic venous insufficiency    GERD (gastroesophageal reflux disease)    Glaucoma    Gout    History of kidney stones    Hyperlipidemia    Hypertension    Obesity (BMI 30-39.9)    Paroxysmal atrial fibrillation ([place]) 02/29/2012   Recurrent paroxysmal, new-onset    PMR (polymyalgia rheumatica) (HCC)    Polymyalgia rheumatica ([place]) 02/06/2019   Pre-diabetes    S/P CABG x 3 03/06/2012   LIMA to LAD, SVG to OM, SVG to RCA, EVH via right thigh   S/P Maze operation for atrial fibrillation 03/06/2012   Complete bilateral lesions set using bipolar radiofrequency and cryothermy ablation with clipping of LA appendage   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   s/p TAVR with a 26 mm Edwards via the left subclavian by Drs Buena Irish and Bartle   Severe aortic stenosis 09/29/2019   Sleep apnea    USES CPAP NIGHTLY   Past Surgical  History:  Procedure Laterality Date   ABDOMINAL AORTOGRAM W/LOWER EXTREMITY N/A 10/26/2020   Procedure: ABDOMINAL AORTOGRAM W/LOWER EXTREMITY;  Surgeon: Broadus John, MD;  Location: [facility];  Service: Cardiovascular;  Laterality: N/A;   CARDIAC CATHETERIZATION     CATARACT EXTRACTION, BILATERAL     with lens implants   CHOLECYSTECTOMY N/A 03/24/2012   Procedure: LAPAROSCOPIC CHOLECYSTECTOMY WITH INTRAOPERATIVE CHOLANGIOGRAM;  Surgeon: Zenovia Jarred, MD;  Location: [facility];  Service: General;  Laterality: N/A;   CORONARY ARTERY BYPASS GRAFT N/A 03/06/2012   Procedure: CORONARY ARTERY BYPASS GRAFTING (CABG);  Surgeon: Rexene Alberts, MD;  Location: [facility];  Service: Open Heart Surgery;  Laterality: N/A;   ENDOVEIN HARVEST OF GREATER SAPHENOUS VEIN Right 03/06/2012   Procedure: ENDOVEIN HARVEST OF GREATER SAPHENOUS VEIN;  Surgeon: Rexene Alberts, MD;  Location: [facility];  Service: Open Heart Surgery;  Laterality: Right;   INTRAOPERATIVE TRANSESOPHAGEAL ECHOCARDIOGRAM N/A 03/06/2012   Procedure: INTRAOPERATIVE TRANSESOPHAGEAL ECHOCARDIOGRAM;  Surgeon: Rexene Alberts, MD;  Location: [facility];  Service: Open Heart Surgery;  Laterality: N/A;   IR KYPHO LUMBAR INC FX REDUCE BONE BX UNI/BIL CANNULATION INC/IMAGING  02/23/2020   LEFT HEART CATH Right 03/02/2012   Procedure: LEFT HEART CATH;  Surgeon: Sherren Mocha, MD;  Location:  [facility];  Service: Cardiovascular;  Laterality: Right;   MAZE N/A 03/06/2012   Procedure: MAZE;  Surgeon: Rexene Alberts, MD;  Location: [facility];  Service: Open Heart Surgery;  Laterality: N/A;   PERIPHERAL VASCULAR INTERVENTION Right 10/26/2020   Procedure: PERIPHERAL VASCULAR INTERVENTION;  Surgeon: Broadus John, MD;  Location: [facility];  Service: Cardiovascular;  Laterality: Right;   PROSTATECTOMY     partial   RIGHT/LEFT HEART CATH AND CORONARY/GRAFT ANGIOGRAPHY N/A 09/29/2019   Procedure: RIGHT/LEFT HEART CATH AND CORONARY/GRAFT ANGIOGRAPHY;  Surgeon:  Adrian Prows, MD;  Location: [facility];  Service: Cardiovascular;  Laterality: N/A;   TEE WITHOUT CARDIOVERSION N/A 11/10/2019   Procedure: TRANSESOPHAGEAL ECHOCARDIOGRAM (TEE);  Surgeon: Burnell Blanks, MD;  Location: [facility];  Service: Open Heart Surgery;  Laterality: N/A;   TRANSCATHETER AORTIC VALVE REPLACEMENT, TRANSFEMORAL  11/10/2019   Patient Active Problem List   Diagnosis Date Noted   Left middle cerebral artery stroke ([place]) 05/31/2021   Acute CVA (cerebrovascular accident) ([place]) 05/25/2021   DNR (do not resuscitate) 05/25/2021   Osteoporosis 05/05/2021   Gouty arthritis 02/13/2021   Morbid obesity ([place]) 02/13/2021   Abdominal aortic aneurysm without rupture ([place]) 11/22/2020   Acute renal failure syndrome ([place]) 11/22/2020   Low back pain 11/22/2020   Skin tear of right upper arm without complication 28/00/3491   Pressure injury of skin 10/23/2020   Urinary tract infection with hematuria 10/22/2020   Encephalopathy 10/22/2020   AKI (acute kidney injury) ([place])    Overweight (BMI 25.0-29.9) 08/19/2020   Vertebral fracture, osteoporotic ([place]) 08/18/2020   T8 vertebral fracture ([place]) 08/17/2020   Thrombocytopenia ([place]) 08/17/2020   CKD (chronic kidney disease) stage 3, GFR 30-59 ml/min (HCC) 08/17/2020   Elevated troponin 08/17/2020   Pain due to onychomycosis of toenails of both feet 07/27/2020   Callus of heel 07/27/2020   Bifascicular block 11/11/2019   Acute on chronic diastolic heart failure ([place]) 11/10/2019   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   Severe aortic stenosis 09/29/2019   Aortic valve disorder 79/15/0569   Systolic heart failure ([place]) 03/11/2019   Thrombophilia ([place]) 02/26/2019   Muscle pain 02/11/2019   Weakness 02/06/2019   Polymyalgia rheumatica ([place]) 02/06/2019   Shoulder joint pain 02/02/2019   Anemia 01/26/2019   Malignant melanoma of skin ([place]) 01/26/2019   Pain in right leg 01/26/2019   DDD (degenerative  disc disease), cervical 05/28/2018   Encounter for general adult medical examination without abnormal findings 11/19/2017   Carotid artery occlusion 07/03/2017   Hearing loss 07/03/2017   Fall 06/19/2017   Injury of upper extremity 06/19/2017   Neck pain 06/19/2017   Headache 06/19/2017   Visual disturbance 06/19/2017   Ganglion of hand 05/08/2017   Dilatation of aorta ([place]) 02/14/2017   Plantar fascial fibromatosis 02/14/2017   Cardiac murmur, unspecified 02/27/2016   Benign neoplasm of colon 08/24/2015   Dementia ([place]) 08/24/2015   Influenza due to unidentified influenza virus with other respiratory manifestations 03/15/2015   Epistaxis 12/01/2014   OSA (obstructive sleep apnea) 11/18/2014   Gastro-esophageal reflux disease with esophagitis 10/29/2014   Angina pectoris ([place]) 05/04/2014   Abnormal feces 07/31/2013   Other specified disorders of gingiva and edentulous alveolar ridge 07/31/2013   Spontaneous ecchymosis 10/10/2012   Thoracic ascending aortic aneurysm ([place]) 09/29/2012   Microscopic hematuria 04/11/2012   Proteinuria 04/11/2012   S/P laparoscopic cholecystectomy 04/09/2012   S/P CABG x 3 03/06/2012   S/P Maze  operation for atrial fibrillation 03/06/2012   Paroxysmal atrial fibrillation ([place]) 02/29/2012   Bitten or stung by nonvenomous insect and other nonvenomous arthropods, initial encounter 12/03/2011   Depression screening 10/17/2011   CAD (coronary artery disease), native coronary artery    Hyperlipidemia    Hypertension    Chronic venous insufficiency    Gout    Benign prostatic hyperplasia    Post-traumatic stress disorder 03/13/2011   Pain in left leg 12/08/2010   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   Fatigue 08/08/2010   Retention of urine 04/18/2010   Constipation 07/05/2009   Kidney stone 07/05/2009   ED (erectile dysfunction) of organic origin 12/14/2008   Impaired fasting glucose 12/14/2008   Vitamin B deficiency 12/14/2008    ONSET DATE:  05/31/21  REFERRING DIAG: Y19.509 (ICD-10-CM) - Left middle cerebral artery stroke (HCC)   THERAPY DIAG:  Hemiplegia and hemiparesis following cerebral infarction affecting right dominant side (HCC)  Muscle weakness (generalized)  Unsteadiness on feet  Other abnormalities of gait and mobility  Difficulty in walking, not elsewhere classified  Rationale for Evaluation and Treatment Rehabilitation  SUBJECTIVE:                                                                                                                                                                                              SUBJECTIVE STATEMENT: Been using the TENS unit now at home with wife's assistance  Pt accompanied by: self  PERTINENT HISTORY: medical history significant of AAA; CAD s/p CABG; chronic systolic CHF; afib s/p Maze, on Xarelto; HTN; HLD; PMR (currently on steroids); pre-diabetes; OSA on CPAP; PAD with lymphedema; and AS s/p TAVR   PAIN:  Are you having pain? Yes: NPRS scale: 3/10 Pain location: lumbar spine Pain description: aching, sore, sharp Aggravating factors: standing/walking Relieving factors: recliner, hot pack, stretching  PRECAUTIONS: Fall, pressure sores bilateral calcaneus right > left  PATIENT GOALS be able to stand unsupported for a time to perform ADL, walking if possible  OBJECTIVE:   TODAY'S TREATMENT: 10/09/21 Activity Comments  Portable TENS applied at start of session Pre-mod settings and pt directed intensity to level 3-4  Seated ther ex -ankle pumps 30x -hip add iso 30x -  Standing at counter -positioned at sink. Initiated lateral reaching and overhead 1x3 reps left/right. Onset of UE fatigue from supporting 3.5 min. -Placed a 4" box and foam pad on top to afford forearm weightbearing x 12 min  Transfer training Sit-stand w/ CGA-min A for sequence and physical assist from various seat heights. Heavy reliance on BUE            TODAY'S TREATMENT:  10/04/21 Activity Comments  TENS to B lumbar paraspinals while performing sitting and standing exercises 150hz, intensity to tolerance (3.25), 30 min  Added MHP to LB for pain relief x20 min Pt noted relief  STS pushing off B armrests x5 Min A and using II bars for balance upon standing; cueing to reach back for armrests upon sitting down to decrease reliance on II bars   standing II bars: side to side wt shift 10x March 10x turning to look over the shoulder 2x6 alt UE raise 8x, 2x ant/pos wt shift 10x  CGA-min A and B UE support on II bars. Intermittent blocking R knee d/t shaking  Alt LAQ 2# 20x march 2#  20x HS curl red TB 10x  Cues for core and avoiding valsalva     HOME EXERCISE PROGRAM Last updated: 09/18/21 Access Code: [email or web address] URL: [email or web address] Date: 09/18/2021 Prepared by: [facility]  Exercises - Seated Heel Raise  - 1 x daily - 5 x weekly - 2 sets - 10 reps - Seated Toe Raise  - 1 x daily - 5 x weekly - 2 sets - 10 reps - Seated March  - 1 x daily - 5 x weekly - 2 sets - 10 reps - Seated Long Arc Quad  - 1 x daily - 5 x weekly - 2 sets - 10 reps - Seated Hip Adduction Squeeze with Ball  - 1 x daily - 5 x weekly - 2 sets - 10 reps - Row with Anchored Resistance on Swiss Ball  - 1 x daily - 5 x weekly - 2 sets - 10 reps - Seated Shoulder Extension and Scapular Retraction with Resistance on Swiss Ball  - 1 x daily - 5 x weekly - 2 sets - 10 reps    Below measures were taken at time of initial evaluation unless otherwise specified:   DIAGNOSTIC FINDINGS: IMPRESSION: Small areas of acute infarction in the right occipital lobe and left frontal lobe over the convexity. Mild to moderate acute infarct left operculum. Possible cerebral emboli.   Atrophy and mild chronic microvascular ischemia  COGNITION: Overall cognitive status: Within functional limits for tasks assessed Patient's historical recall is not  very accurate, he reports his back injury presented x 9 months ago, whereas medical record notes encounter in 12/2019-01/2020 regarding L1 compression fx   SENSATION: Not tested  COORDINATION: Alt. Foot taps impaired due to dorsiflexion weakness  EDEMA:  Bilateral LE, wearing compression stockings. Uses lymphedema pump  MUSCLE TONE: DNT    POSTURE: rounded shoulders  LOWER EXTREMITY ROM:     Passive  Right Eval Left Eval  Hip flexion    Hip extension    Hip abduction    Hip adduction    Hip internal rotation    Hip external rotation    Knee flexion    Knee extension    Ankle dorsiflexion -10 -10  Ankle plantarflexion    Ankle inversion    Ankle eversion     (Blank rows = not tested)  LOWER EXTREMITY MMT:    MMT Right Eval Left Eval  Hip flexion 3 3  Hip extension    Hip abduction 2+ 2+  Hip adduction 3 3  Hip internal rotation    Hip external rotation    Knee flexion 3+ 3+  Knee extension 4- 4-  Ankle dorsiflexion 0 0  Ankle plantarflexion    Ankle inversion    Ankle eversion    (  Blank rows = not tested)  BED MOBILITY:  Sit to supine Min A and Mod A Supine to sit Min A Pt notes his bed at home has assist rails  TRANSFERS: Assistive device utilized: Wheelchair (manual)  Sit to stand: CGA and Min A Stand to sit: CGA and Min A Chair to chair: CGA and Min A Floor:  DNT   W/C mobility Independent level surfaces  STAIRS:  DNT--pt has elevator to enter/exit home  GAIT: DNT due to safety concerns  FUNCTIONAL TESTs:  5 times sit to stand: unable, requires BUE and assist Static standing: dependent on BUE via RW (supported standing) Straight Leg Raise: 20+ degrees extensor lag  PATIENT SURVEYS:  FOTO to be assessed     PATIENT EDUCATION: Education details: assessment findings Person educated: Patient Education method: Explanation Education comprehension: verbalized understanding   HOME EXERCISE PROGRAM: To be initiated,  demonstration/rehearsal of hip add isometric    GOALS: Goals reviewed with patient? Yes  SHORT TERM GOALS: Target date: 09/26/2021  Patient will be independent in HEP to improve functional outcomes Baseline: Goal status: GOAL MET, 09/27/2021  2.  Patient to demonstrate sit to supine w/ supervision using adaptive equipment to reduce level of assist from caregiver Baseline: Min-mod A for BLE to clear EOB Goal status: per report 09/27/2021-NOT MET    LONG TERM GOALS: Target date: 10/24/2021  Demonstrate improved hip abduction strength to 3+/5 to improve standing stability Baseline: 2+/5 Goal status: IN PROGRESS  2.  Manifest improved BLE strength as evidenced by ability to perform 5xSTS test in 30 sec using BUE Baseline: unable Goal status: IN PROGRESS  3.  Demonstrate static standing balance of Fair (no UE support) x 15 sec to improve safety with ADL performance Baseline: unable, dependent on BUE (Fair-) Goal status: IN PROGRESS  4.  Demonstrate transfers w/ set-up assist to increase independence and safety with mobility Baseline: CGA-min A Goal status: IN PROGRESS   ASSESSMENT:  CLINICAL IMPRESSION: Pt notes improved activity tolerance using TENS unit during activities and notes that his wife is able to help with pad placement and he is improving in intensity control requiring 25% assist in sequence and procedure.  Able to tolerate increase time in standing by way of weight bearing through his forearms.  May benefit from trial of mobility and transfers with bilateral platform RW    OBJECTIVE IMPAIRMENTS cardiopulmonary status limiting activity, decreased activity tolerance, decreased balance, decreased knowledge of use of DME, decreased mobility, difficulty walking, decreased ROM, decreased strength, impaired perceived functional ability, and pain.   ACTIVITY LIMITATIONS lifting, bending, standing, squatting, transfers, bed mobility, toileting, dressing, and locomotion  level  PARTICIPATION LIMITATIONS: meal prep, cleaning, community activity, yard work, and home mgmt  PERSONAL FACTORS Age, Time since onset of injury/illness/exacerbation, and 3+ comorbidities: see PMH  are also affecting patient's functional outcome.   REHAB POTENTIAL: Good  CLINICAL DECISION MAKING: Evolving/moderate complexity  EVALUATION COMPLEXITY: Moderate  PLAN: PT FREQUENCY: 2x/week  PT DURATION: 8 weeks  PLANNED INTERVENTIONS: Therapeutic exercises, Therapeutic activity, Neuromuscular re-education, Balance training, Gait training, Patient/Family education, Self Care, Joint mobilization, Stair training, Vestibular training, Canalith repositioning, Orthotic/Fit training, DME instructions, Aquatic Therapy, Dry Needling, Electrical stimulation, Wheelchair mobility training, Spinal mobilization, Cryotherapy, Moist heat, Taping, Ionotophoresis 53m/ml Dexamethasone, and Manual therapy  PLAN FOR NEXT SESSION: Progress Note. Transfers/static standing/gait(?) with bilateral platform RW  2:57 PM, 10/09/21 M. KSherlyn Lees PT, DPT Physical Therapist- [place] Office Number: [phone]  CMarkhamat BThe [facility]  [address] [place], [place] [address] Phone # [phone] Fax # [phone]

## 2021-10-10 NOTE — Therapy (Signed)
OUTPATIENT PHYSICAL THERAPY NEURO PROGRESS NOTE   Patient Name: [name] [name] MRN: [id number] DOB:April 15, 1926, 88 y.o., male Today's Date: 10/11/2021   Progress Note Reporting Period 08/29/21 to 10/11/21  See note below for Objective Data and Assessment of Progress/Goals.      PCP: [name], MD REFERRING PROVIDER: Charlett Blake, MD    PT End of Session - 10/11/21 1448     Visit Number 10    Number of Visits 16    Date for PT Re-Evaluation 11/07/21    Authorization Type BCBS Medicare    Authorization Time Period no VL, no auth    Progress Note Due on Visit 20    PT Start Time 1400    PT Stop Time 1442    PT Time Calculation (min) 42 min    Equipment Utilized During Treatment Gait belt    Activity Tolerance Patient tolerated treatment well;Patient limited by pain    Behavior During Therapy WFL for tasks assessed/performed                    Past Medical History:  Diagnosis Date   AAA (abdominal aortic aneurysm) ([place])    Arthritis    Ascending aorta dilatation ([place]) 03/13/2012   Fusiform dilatation of the ascending thoracic aorta discovered during surgery, max diameter 4.2-4.3 cm   BPH (benign prostatic hypertrophy)    Chronic systolic heart failure ([place]) 03/03/2012   Chronic venous insufficiency    GERD (gastroesophageal reflux disease)    Glaucoma    Gout    History of kidney stones    Hyperlipidemia    Hypertension    Obesity (BMI 30-39.9)    Paroxysmal atrial fibrillation ([place]) 02/29/2012   Recurrent paroxysmal, new-onset    PMR (polymyalgia rheumatica) (HCC)    Polymyalgia rheumatica ([place]) 02/06/2019   Pre-diabetes    S/P CABG x 3 03/06/2012   LIMA to LAD, SVG to OM, SVG to RCA, EVH via right thigh   S/P Maze operation for atrial fibrillation 03/06/2012   Complete bilateral lesions set using bipolar radiofrequency and cryothermy ablation with clipping of LA appendage   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    s/p TAVR with a 26 mm Edwards via the left subclavian by Drs Buena Irish and Bartle   Severe aortic stenosis 09/29/2019   Sleep apnea    USES CPAP NIGHTLY   Past Surgical History:  Procedure Laterality Date   ABDOMINAL AORTOGRAM W/LOWER EXTREMITY N/A 10/26/2020   Procedure: ABDOMINAL AORTOGRAM W/LOWER EXTREMITY;  Surgeon: Broadus John, MD;  Location: [facility];  Service: Cardiovascular;  Laterality: N/A;   CARDIAC CATHETERIZATION     CATARACT EXTRACTION, BILATERAL     with lens implants   CHOLECYSTECTOMY N/A 03/24/2012   Procedure: LAPAROSCOPIC CHOLECYSTECTOMY WITH INTRAOPERATIVE CHOLANGIOGRAM;  Surgeon: Zenovia Jarred, MD;  Location: [facility];  Service: General;  Laterality: N/A;   CORONARY ARTERY BYPASS GRAFT N/A 03/06/2012   Procedure: CORONARY ARTERY BYPASS GRAFTING (CABG);  Surgeon: Rexene Alberts, MD;  Location: [facility];  Service: Open Heart Surgery;  Laterality: N/A;   ENDOVEIN HARVEST OF GREATER SAPHENOUS VEIN Right 03/06/2012   Procedure: ENDOVEIN HARVEST OF GREATER SAPHENOUS VEIN;  Surgeon: Rexene Alberts, MD;  Location: [facility];  Service: Open Heart Surgery;  Laterality: Right;   INTRAOPERATIVE TRANSESOPHAGEAL ECHOCARDIOGRAM N/A 03/06/2012   Procedure: INTRAOPERATIVE TRANSESOPHAGEAL ECHOCARDIOGRAM;  Surgeon: Rexene Alberts, MD;  Location: [facility];  Service: Open Heart Surgery;  Laterality: N/A;  IR KYPHO LUMBAR INC FX REDUCE BONE BX UNI/BIL CANNULATION INC/IMAGING  02/23/2020   LEFT HEART CATH Right 03/02/2012   Procedure: LEFT HEART CATH;  Surgeon: Sherren Mocha, MD;  Location: [facility];  Service: Cardiovascular;  Laterality: Right;   MAZE N/A 03/06/2012   Procedure: MAZE;  Surgeon: Rexene Alberts, MD;  Location: [facility];  Service: Open Heart Surgery;  Laterality: N/A;   PERIPHERAL VASCULAR INTERVENTION Right 10/26/2020   Procedure: PERIPHERAL VASCULAR INTERVENTION;  Surgeon: Broadus John, MD;  Location: [facility];  Service: Cardiovascular;  Laterality: Right;    PROSTATECTOMY     partial   RIGHT/LEFT HEART CATH AND CORONARY/GRAFT ANGIOGRAPHY N/A 09/29/2019   Procedure: RIGHT/LEFT HEART CATH AND CORONARY/GRAFT ANGIOGRAPHY;  Surgeon: Adrian Prows, MD;  Location: [facility];  Service: Cardiovascular;  Laterality: N/A;   TEE WITHOUT CARDIOVERSION N/A 11/10/2019   Procedure: TRANSESOPHAGEAL ECHOCARDIOGRAM (TEE);  Surgeon: Burnell Blanks, MD;  Location: [facility];  Service: Open Heart Surgery;  Laterality: N/A;   TRANSCATHETER AORTIC VALVE REPLACEMENT, TRANSFEMORAL  11/10/2019   Patient Active Problem List   Diagnosis Date Noted   Left middle cerebral artery stroke ([place]) 05/31/2021   Acute CVA (cerebrovascular accident) ([place]) 05/25/2021   DNR (do not resuscitate) 05/25/2021   Osteoporosis 05/05/2021   Gouty arthritis 02/13/2021   Morbid obesity ([place]) 02/13/2021   Abdominal aortic aneurysm without rupture ([place]) 11/22/2020   Acute renal failure syndrome ([place]) 11/22/2020   Low back pain 11/22/2020   Skin tear of right upper arm without complication 66/29/4765   Pressure injury of skin 10/23/2020   Urinary tract infection with hematuria 10/22/2020   Encephalopathy 10/22/2020   AKI (acute kidney injury) ([place])    Overweight (BMI 25.0-29.9) 08/19/2020   Vertebral fracture, osteoporotic ([place]) 08/18/2020   T8 vertebral fracture ([place]) 08/17/2020   Thrombocytopenia ([place]) 08/17/2020   CKD (chronic kidney disease) stage 3, GFR 30-59 ml/min (HCC) 08/17/2020   Elevated troponin 08/17/2020   Pain due to onychomycosis of toenails of both feet 07/27/2020   Callus of heel 07/27/2020   Bifascicular block 11/11/2019   Acute on chronic diastolic heart failure ([place]) 11/10/2019   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   Severe aortic stenosis 09/29/2019   Aortic valve disorder 46/50/3546   Systolic heart failure ([place]) 03/11/2019   Thrombophilia ([place]) 02/26/2019   Muscle pain 02/11/2019   Weakness 02/06/2019   Polymyalgia rheumatica  ([place]) 02/06/2019   Shoulder joint pain 02/02/2019   Anemia 01/26/2019   Malignant melanoma of skin ([place]) 01/26/2019   Pain in right leg 01/26/2019   DDD (degenerative disc disease), cervical 05/28/2018   Encounter for general adult medical examination without abnormal findings 11/19/2017   Carotid artery occlusion 07/03/2017   Hearing loss 07/03/2017   Fall 06/19/2017   Injury of upper extremity 06/19/2017   Neck pain 06/19/2017   Headache 06/19/2017   Visual disturbance 06/19/2017   Ganglion of hand 05/08/2017   Dilatation of aorta ([place]) 02/14/2017   Plantar fascial fibromatosis 02/14/2017   Cardiac murmur, unspecified 02/27/2016   Benign neoplasm of colon 08/24/2015   Dementia ([place]) 08/24/2015   Influenza due to unidentified influenza virus with other respiratory manifestations 03/15/2015   Epistaxis 12/01/2014   OSA (obstructive sleep apnea) 11/18/2014   Gastro-esophageal reflux disease with esophagitis 10/29/2014   Angina pectoris ([place]) 05/04/2014   Abnormal feces 07/31/2013   Other specified disorders of gingiva and edentulous alveolar ridge 07/31/2013   Spontaneous ecchymosis 10/10/2012  Thoracic ascending aortic aneurysm ([place]) 09/29/2012   Microscopic hematuria 04/11/2012   Proteinuria 04/11/2012   S/P laparoscopic cholecystectomy 04/09/2012   S/P CABG x 3 03/06/2012   S/P Maze operation for atrial fibrillation 03/06/2012   Paroxysmal atrial fibrillation ([place]) 02/29/2012   Bitten or stung by nonvenomous insect and other nonvenomous arthropods, initial encounter 12/03/2011   Depression screening 10/17/2011   CAD (coronary artery disease), native coronary artery    Hyperlipidemia    Hypertension    Chronic venous insufficiency    Gout    Benign prostatic hyperplasia    Post-traumatic stress disorder 03/13/2011   Pain in left leg 12/08/2010   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   Fatigue 08/08/2010   Retention of urine 04/18/2010   Constipation 07/05/2009   Kidney  stone 07/05/2009   ED (erectile dysfunction) of organic origin 12/14/2008   Impaired fasting glucose 12/14/2008   Vitamin B deficiency 12/14/2008    ONSET DATE: 05/31/21  REFERRING DIAG: S81.103 (ICD-10-CM) - Left middle cerebral artery stroke (HCC)   THERAPY DIAG:  Muscle weakness (generalized)  Other disturbances of skin sensation  Unsteadiness on feet  Difficulty in walking, not elsewhere classified  Rationale for Evaluation and Treatment Rehabilitation  SUBJECTIVE:                                                                                                                                                                                              SUBJECTIVE STATEMENT: Admits to not doing his HEP- still has the printout.   Pt accompanied by: self  PERTINENT HISTORY: medical history significant of AAA; CAD s/p CABG; chronic systolic CHF; afib s/p Maze, on Xarelto; HTN; HLD; PMR (currently on steroids); pre-diabetes; OSA on CPAP; PAD with lymphedema; and AS s/p TAVR   PAIN:  Are you having pain? Yes: NPRS scale: 5/10 Pain location: lumbar spine and thighs  Pain description: aching, sore, sharp Aggravating factors: standing/walking Relieving factors: recliner, hot pack, stretching  PRECAUTIONS: Fall, pressure sores bilateral calcaneus right > left  PATIENT GOALS be able to stand unsupported for a time to perform ADL, walking if possible  OBJECTIVE:      TODAY'S TREATMENT: 10/11/21 Activity Comments  Hip abduction MMT (in sitting) L 2+, R 4-  5xSTS in II bars Pt pushing off armrests or II bars and PT providing min A as needed. 1 min 29 sec from w/c   Static standing with B UE support on II bar  22 sec; limited by LBP/LE pain   Stand pivot w/c<>mat  CGA/supervision to mat, CGA-min A to w/c; patient refused to try transfers with w/c positioned at 45  deg angle rather than w/c facing him which was significantly less safe  Sit>supine using stretch out strap on R LE  Min A required  Supine LTR 20x, alt march in hooklying 2x20 Good tolerance          PATIENT EDUCATION: Education details: encouraged patient/wife [name] to reach out to PCP to help address pain levels during sessions and improve compliance with HEP; HEP update Person educated: Patient and Spouse Education method: Explanation, Demonstration, Tactile cues, Verbal cues, and Handouts Education comprehension: verbalized understanding and returned demonstration   HOME EXERCISE PROGRAM Last updated: 10/11/21 Access Code: [email or web address] URL: [email or web address] Date: 10/11/2021 Prepared by: [facility]  Exercises - Seated Heel Raise  - 1 x daily - 5 x weekly - 2 sets - 10 reps - Seated Toe Raise  - 1 x daily - 5 x weekly - 2 sets - 10 reps - Seated March  - 1 x daily - 5 x weekly - 2 sets - 10 reps - Seated Long Arc Quad  - 1 x daily - 5 x weekly - 2 sets - 10 reps - Seated Hip Adduction Squeeze with Ball  - 1 x daily - 5 x weekly - 2 sets - 10 reps - Row with Anchored Resistance on Swiss Ball  - 1 x daily - 5 x weekly - 2 sets - 10 reps - Seated Shoulder Extension and Scapular Retraction with Resistance on Swiss Ball  - 1 x daily - 5 x weekly - 2 sets - 10 reps - Supine Lower Trunk Rotation  - 1 x daily - 5 x weekly - 2 sets - 20 reps    Below measures were taken at time of initial evaluation unless otherwise specified:   DIAGNOSTIC FINDINGS: IMPRESSION: Small areas of acute infarction in the right occipital lobe and left frontal lobe over the convexity. Mild to moderate acute infarct left operculum. Possible cerebral emboli.   Atrophy and mild chronic microvascular ischemia  COGNITION: Overall cognitive status: Within functional limits for tasks assessed Patient's historical recall is not very accurate, he reports his back injury presented x 9 months ago, whereas medical record notes encounter in 12/2019-01/2020 regarding L1  compression fx   SENSATION: Not tested  COORDINATION: Alt. Foot taps impaired due to dorsiflexion weakness  EDEMA:  Bilateral LE, wearing compression stockings. Uses lymphedema pump  MUSCLE TONE: DNT    POSTURE: rounded shoulders  LOWER EXTREMITY ROM:     Passive  Right Eval Left Eval  Hip flexion    Hip extension    Hip abduction    Hip adduction    Hip internal rotation    Hip external rotation    Knee flexion    Knee extension    Ankle dorsiflexion -10 -10  Ankle plantarflexion    Ankle inversion    Ankle eversion     (Blank rows = not tested)  LOWER EXTREMITY MMT:    MMT Right Eval Left Eval  Hip flexion 3 3  Hip extension    Hip abduction 2+ 2+  Hip adduction 3 3  Hip internal rotation    Hip external rotation    Knee flexion 3+ 3+  Knee extension 4- 4-  Ankle dorsiflexion 0 0  Ankle plantarflexion    Ankle inversion    Ankle eversion    (Blank rows = not tested)  BED MOBILITY:  Sit to supine Min A and Mod A Supine to sit Min A  Pt notes his bed at home has assist rails  TRANSFERS: Assistive device utilized: Wheelchair (manual)  Sit to stand: CGA and Min A Stand to sit: CGA and Min A Chair to chair: CGA and Min A Floor:  DNT   W/C mobility Independent level surfaces  STAIRS:  DNT--pt has elevator to enter/exit home  GAIT: DNT due to safety concerns  FUNCTIONAL TESTs:  5 times sit to stand: unable, requires BUE and assist Static standing: dependent on BUE via RW (supported standing) Straight Leg Raise: 20+ degrees extensor lag  PATIENT SURVEYS:  FOTO to be assessed     PATIENT EDUCATION: Education details: assessment findings Person educated: Patient Education method: Explanation Education comprehension: verbalized understanding   HOME EXERCISE PROGRAM: To be initiated, demonstration/rehearsal of hip add isometric    GOALS: Goals reviewed with patient? Yes  SHORT TERM GOALS: Target date: 09/26/2021  Patient  will be independent in HEP to improve functional outcomes Baseline: Goal status: GOAL MET, 09/27/2021  2.  Patient to demonstrate sit to supine w/ supervision using adaptive equipment to reduce level of assist from caregiver Baseline: Min-mod A for BLE to clear EOB; min A with stretch out strap 10/11/21 Goal status: per report 09/27/2021-NOT MET    LONG TERM GOALS: Target date: 11/07/2021  Demonstrate improved hip abduction strength to 3+/5 to improve standing stability Baseline: 2+/5; 2+/5 L and 4- on R 10/11/21 Goal status: IN PROGRESS 10/11/21  2.  Manifest improved BLE strength as evidenced by ability to perform 5xSTS test in 30 sec using BUE Baseline: unable; 1 min 29 sec 10/11/21 Goal status: IN PROGRESS 10/11/21  3.  Demonstrate static standing balance of Fair (no UE support) x 15 sec to improve safety with ADL performance Baseline: unable, dependent on BUE (Fair-); unchanged 10/11/21 Goal status: IN PROGRESS 10/11/21  4.  Demonstrate transfers w/ set-up assist to increase independence and safety with mobility Baseline: CGA-min A; unchanged 10/11/21 Goal status: IN PROGRESS 10/11/21   ASSESSMENT:  CLINICAL IMPRESSION: Patient arrived to session with report of continued LBP. Spoke to patient about severity of LBP limiting patient's tolerance for activities during sessions. Also discussed improved compliance with HEP as he admits to not doing his exercises. Hip strength on L is unchanged, improved on R. Able to perform 5xSTS with min A and parallel bars for assist and requiring increased time to complete. Patient still required B UE support to maintain standing, indicating limited standing tolerance and stability. Patient was able to perform stand pivot transfers today and CGA-min A and was not able/willing to use suggestions from PT to improve safety with transfers today. Patient is demonstrating slow progress towards goals, with LBP as main barrier to progress.   OBJECTIVE  IMPAIRMENTS cardiopulmonary status limiting activity, decreased activity tolerance, decreased balance, decreased knowledge of use of DME, decreased mobility, difficulty walking, decreased ROM, decreased strength, impaired perceived functional ability, and pain.   ACTIVITY LIMITATIONS lifting, bending, standing, squatting, transfers, bed mobility, toileting, dressing, and locomotion level  PARTICIPATION LIMITATIONS: meal prep, cleaning, community activity, yard work, and home mgmt  PERSONAL FACTORS Age, Time since onset of injury/illness/exacerbation, and 3+ comorbidities: see PMH  are also affecting patient's functional outcome.   REHAB POTENTIAL: Good  CLINICAL DECISION MAKING: Evolving/moderate complexity  EVALUATION COMPLEXITY: Moderate  PLAN: PT FREQUENCY: 2x/week  PT DURATION: 8 weeks  PLANNED INTERVENTIONS: Therapeutic exercises, Therapeutic activity, Neuromuscular re-education, Balance training, Gait training, Patient/Family education, Self Care, Joint mobilization, Stair training, Vestibular training, Canalith repositioning, Orthotic/Fit training, DME  instructions, Aquatic Therapy, Dry Needling, Electrical stimulation, Wheelchair mobility training, Spinal mobilization, Cryotherapy, Moist heat, Taping, Ionotophoresis 11m/ml Dexamethasone, and Manual therapy  PLAN FOR NEXT SESSION: Transfers/static standing/gait(?) with bilateral platform RW    YJanene Harvey PT, DPT 10/11/21 2:59 PM  [facility] SLuxemburgGBet[place] [address]Phone # [phone] # [phone]

## 2021-10-11 ENCOUNTER — Ambulatory Visit: Payer: Medicare Other | Admitting: Physical Therapy

## 2021-10-11 ENCOUNTER — Encounter (HOSPITAL_BASED_OUTPATIENT_CLINIC_OR_DEPARTMENT_OTHER): Payer: Medicare Other | Admitting: Physician Assistant

## 2021-10-11 ENCOUNTER — Encounter: Payer: Self-pay | Admitting: Physical Therapy

## 2021-10-11 ENCOUNTER — Ambulatory Visit: Payer: Medicare Other | Admitting: Occupational Therapy

## 2021-10-11 DIAGNOSIS — I69351 Hemiplegia and hemiparesis following cerebral infarction affecting right dominant side: Secondary | ICD-10-CM

## 2021-10-11 DIAGNOSIS — L97522 Non-pressure chronic ulcer of other part of left foot with fat layer exposed: Secondary | ICD-10-CM | POA: Diagnosis not present

## 2021-10-11 DIAGNOSIS — S71111A Laceration without foreign body, right thigh, initial encounter: Secondary | ICD-10-CM | POA: Diagnosis not present

## 2021-10-11 DIAGNOSIS — R41841 Cognitive communication deficit: Secondary | ICD-10-CM | POA: Diagnosis not present

## 2021-10-11 DIAGNOSIS — R2681 Unsteadiness on feet: Secondary | ICD-10-CM | POA: Diagnosis not present

## 2021-10-11 DIAGNOSIS — L97512 Non-pressure chronic ulcer of other part of right foot with fat layer exposed: Secondary | ICD-10-CM | POA: Diagnosis not present

## 2021-10-11 DIAGNOSIS — R41840 Attention and concentration deficit: Secondary | ICD-10-CM | POA: Diagnosis not present

## 2021-10-11 DIAGNOSIS — I7389 Other specified peripheral vascular diseases: Secondary | ICD-10-CM | POA: Diagnosis not present

## 2021-10-11 DIAGNOSIS — L97822 Non-pressure chronic ulcer of other part of left lower leg with fat layer exposed: Secondary | ICD-10-CM | POA: Diagnosis not present

## 2021-10-11 DIAGNOSIS — T8131XA Disruption of external operation (surgical) wound, not elsewhere classified, initial encounter: Secondary | ICD-10-CM | POA: Diagnosis not present

## 2021-10-11 DIAGNOSIS — R208 Other disturbances of skin sensation: Secondary | ICD-10-CM

## 2021-10-11 DIAGNOSIS — R262 Difficulty in walking, not elsewhere classified: Secondary | ICD-10-CM | POA: Diagnosis not present

## 2021-10-11 DIAGNOSIS — L89613 Pressure ulcer of right heel, stage 3: Secondary | ICD-10-CM | POA: Diagnosis not present

## 2021-10-11 DIAGNOSIS — R2689 Other abnormalities of gait and mobility: Secondary | ICD-10-CM | POA: Diagnosis not present

## 2021-10-11 DIAGNOSIS — M6281 Muscle weakness (generalized): Secondary | ICD-10-CM

## 2021-10-11 DIAGNOSIS — N401 Enlarged prostate with lower urinary tract symptoms: Secondary | ICD-10-CM | POA: Diagnosis not present

## 2021-10-11 DIAGNOSIS — L89153 Pressure ulcer of sacral region, stage 3: Secondary | ICD-10-CM | POA: Diagnosis not present

## 2021-10-11 DIAGNOSIS — L97412 Non-pressure chronic ulcer of right heel and midfoot with fat layer exposed: Secondary | ICD-10-CM | POA: Diagnosis not present

## 2021-10-11 DIAGNOSIS — I872 Venous insufficiency (chronic) (peripheral): Secondary | ICD-10-CM | POA: Diagnosis not present

## 2021-10-11 DIAGNOSIS — R4701 Aphasia: Secondary | ICD-10-CM | POA: Diagnosis not present

## 2021-10-11 DIAGNOSIS — L97312 Non-pressure chronic ulcer of right ankle with fat layer exposed: Secondary | ICD-10-CM | POA: Diagnosis not present

## 2021-10-11 NOTE — Progress Notes (Addendum)
[place], [place] [name] [name] ([id number]) [id number] Page 1 of 12 Visit Report for 10/11/2021 Chief Complaint Document Details Patient Name: Date of Service: [name] Ser ID [name] [name]. 10/11/2021 3:15 PM Medical Record Number: [id number] Patient Account Number: [id number] Date of Birth/Sex: Treating RN: 1926-06-01 (88 y.o. M) Primary Care Provider: [name] Other Clinician: Referring Provider: Treating Provider/Extender: Eli Hose in Treatment: 65 Information Obtained from: Patient Chief Complaint Multiple ulcers noted over the upper and lower extremities Electronic Signature(s) Signed: 10/11/2021 3:08:23 PM By: [name] PA-C Entered By: [name] on 10/11/2021 15:08:23 -------------------------------------------------------------------------------- HPI Details Patient Name: Date of Service: [name] [name] [name], [name] V ID [name] [name]. 10/11/2021 3:15 PM Medical Record Number: [id number] Patient Account Number: [id number] Date of Birth/Sex: Treating RN: 06/28/1926 (88 y.o. M) Primary Care Provider: [name] Other Clinician: Referring Provider: Treating Provider/Extender: Eli Hose in Treatment: 85 History of Present Illness HPI Description: 09/21/2020 upon evaluation today patient appears to be doing somewhat poorly in regard to a wound in the right lateral heel location. This fortunately does not appear to be too bad but nonetheless is definitely a spot that we need to work on try to get healed for him. Has been here for quite some time. Fortunately there does not appear to be any evidence of active infection at this time which is great news. With that being said I do believe that he has some evidence here of peripheral vascular disease this is minimal his ABI 0.87. Other than that he does have a history of BPH, chronic kidney disease stage III, and hypertension. Currently he has been using different medication such as Silvadene  or antibiotic ointment to try to help treat things. This just is not helping out as efficiently as it should be. Of note patient's history includes that of a triple bypass in 2010, a kyphoplasty at L1 which was done 8 months ago, unfortunately he had a fracture 2 months ago which she sustained a T7/T8 fracture to his spine which ended up with him being in the hospital. It is in that time since then that he developed this wound roughly 2 to 3 months ago is not exactly sure when. He does typically wear compression socks in the 20 to 30 mmHg range. 09/28/2020 upon evaluation today patient presents for follow-up and overall seems to be doing more poorly based on what [name] [name] am seeing currently. His ABI last time was measured at 0.87 but [name] [name] am beginning to think this may be falsely elevated due to the fact that there is more eschar and subsequently a concern here of vascular compromise that may be part of her issue. [name] [name] think that we need to avoid any additional sharp debridement and make a referral to vascular surgery as soon as possible here. Subsequently also went ahead and did discuss the possibility of starting the patient on Santyl instead of the collagen in order to help clean up the surface of the wound this will take the place of sharp debridement currently. 10/19/2020 upon evaluation today patient's wound actually appears to be doing okay although he has an area on the medial aspect of his heel that is only been showing up for the past couple of days this is more of a blister that has ruptured. The good news is there is no deep wound here which is good. Fortunately there is no signs of active infection at this time also good news. With that being  said however the blood flow was not good with an ABI of 0.67 on the right it was around 1 on the left nonetheless [name] [name] think he does need to see vascular we put that referral in last week on the 12th Dr. Dellia Nims reviewed that for me and made that recommendations.  Nonetheless the patient has not heard anything from vascular yet as far as getting this scheduled. [name] [name] think this needs to be done sooner rather than later as [name] [name] am concerned about the possibility of a limb threatening situation if we do not get this under control.. 11/16/2020 upon evaluation today patient presents for follow-up regarding issues that he has been having with his bilateral lower extremities in particular. He in fact still has an issue with his right heel. This appears to be a stage III pressure ulcer at this point there is some slough noted. Subsequently he does have revascularization that is taking place on the right leg as a follow-up later this month in that regard. No repeat ABIs. Subsequently the patient also has venous stasis bilaterally which is also problematic for him at this time. He has some ulcers on the left ankle and left leg that are getting need to be addressed as well. 12/21/2020 upon evaluation today patient appears to be doing poorly in regard to his bilateral lower extremity ulcers. He still has areas on both lower extremities currently ineffective even some areas that were not there previous. Fortunately there is no signs of active infection at this time. No fevers, chills, nausea, KEYLAN, COSTABILE [name] [name] ([id number]) [id number] Page 2 of 12 vomiting, or diarrhea. 01/04/21 upon evaluation today patient actually appears to be doing quite well in regard to his wounds. Fortunately there does not appear to be any signs of active infection which is great. There is some necrotic tissue that [name] [name] think would be helpful to debride away pretty much on all wounds other than the heel which appears to be doing quite well. 01/25/2021 upon evaluation today patient appears to be doing okay in regard to his wounds. There are some measurements that are definitely smaller which is good news. Fortunately I do not see any evidence of active infection locally nor systemically  at this point which is also great news. He does have a deep tissue injury which seems to be getting a bit worse on the lateral portion of his right heel. He tells me is not been using the bunny boots and he has not been using a pillow under his calves either. Nonetheless [name] [name] think this is absolutely a pressure injury and [name] [name] am very concerned about this. Also I do not want this to get a lot worse. He also tells me his hemoglobin is around 7 he is going to be seeing hematology for a transfusion either today or tomorrow most likely this will definitely help with his healing. 02/01/2021 upon evaluation today patient actually appears to be making excellent progress in regard to his wound currently. Fortunately [name] [name] feel like that all the wounds are showing signs of improvement the Iodoflex seems to be doing an awesome job. [name] [name] am very pleased with where we stand today. There does not appear to be any signs of active infection. 02/22/2021 upon evaluation today patient appears to be doing well with regard to his wound. He has been tolerating the dressing changes without complication. Fortunately there does not appear to be any evidence of active infection locally or systemically at this point which is good news. Overall [name] [name] think that  we are headed in the right direction and the Iodoflex is doing a good job. 03/08/2021 upon evaluation today patient appears to be doing well all things considered with regard to his wounds. He has been tolerating the dressing changes without complication. Fortunately there does not appear to be any evidence of active infection locally nor systemically which is great news overall [name] [name] think the Iodosorb is doing an awesome job here. 03/22/2021 upon evaluation today patient appears to be doing well with regard to his wounds for the most part. The biggest issue is that he does have a new wound on his gluteal region really more sacral than anything to be honest. Unfortunately this is going to be  something that we need to address sooner rather than later. It does not appear to be too bad right now but obviously things can get significantly worse. 04-05-2021 upon evaluation today patient actually appears to be doing well in general in regard to his wounds. He does have an area growing on the left anterior shin which is somewhat concerning in fact [name] [name] am wondering about the possibility of even a basal cell carcinoma. With that being said [name] [name] did discuss this with the patient and his wife today [name] [name] think that a biopsy may be the ideal thing to do. 04-19-2021 upon evaluation today patient's wounds are actually showing signs of improvement. The biopsy report [name] [name] did review which showed squamous cell carcinoma we have actually referred him to dermatology he has a appointment with the skin surgery center which is actually for the beginning of May. With that being said [name] [name] think that is absolutely appropriate obviously the sooner that he can get this done the better. Overall [name] [name] am extremely pleased with how things are going and how his wounds are improving. 05-03-2021 upon evaluation today patient's original wounds actually appear to be doing better he does have his upcoming appointment with a Mohs surgeon and hopefully they will be able to get this taken care of as far as that wound is concerned. With that being said with regard to his other wounds I do believe the collagen is doing quite well. Unfortunately he did get a new motorized wheelchair and got this to close to the edge of a curb and actually had a fall where he was thrown from it. This happens because to areas of skin tear 1 over the right thigh and 1 in the right upper arm both of which are still showing some signs of bleeding weeping this happened on Saturday. The patient obviously is in good spirits but nonetheless has a little frustrated with that situation. 05-10-2021 upon evaluation today patient appears to be doing well with regard to his lower  extremity ulcers all of which are either healed or doing better. Unfortunately in regard to his wound on the right thigh this is what has been the most concern. [name] [name] am afraid that this hematoma may actually end up opening into a deeper wound. This is something we can have to keep a close eye on. With that being said I do believe that the right arm is healed and the left arm does exhibit issues here with a skin tear currently which again I do believe is new he hit this in the bathroom this past Saturday. 05-17-2021 upon evaluation today patient appears to be doing well with regard to his wounds. [name] [name] am actually extremely pleased to see that the wound on his right thigh does not appear to be opening into a much  deeper wound. The area of eschar did come off but it appears to be superficial right now hoping it stays this way. Fortunately I do not see any evidence of infection which is great news about the wounds on the lower extremity right and left appear to be doing awesome. There is can be a need for a light sharp debridement at both locations today. 06-21-2021 upon evaluation today patient appears to be doing well all things considered. He did have a stroke and subsequently was in the hospital for a time that is why [name] [name] have not seen him since May 17. The good news is nothing is worse and he seems to be making good progress here with his wounds he also had surgery at the skin surgery center and though there is some slough and biofilm buildup on the surface of this wound he is definitely seeing some improvement with granulation tissue here and very pleased in that regard. 07-05-2021 upon evaluation today patient appears to be doing well currently in regard to his wounds in general although he is having a lot more drainage than he has previously had. Fortunately there does not appear to be any evidence of active infection locally or systemically which is great news. No fevers, chills, nausea, vomiting, or  diarrhea. 07-19-2021 upon evaluation today patient appears to be doing well with regard to his wound. He has been tolerating the dressing changes and all the wounds that we were dealing with last time are doing better which is great news. Unfortunately he has 2 new areas her right heel as well as his right lateral leg where he has a skin tear of the leg and a pressure injury to the heel. Fortunately I do not see any evidence of active infection locally or systemically at this time. 08-02-2021 upon evaluation today patient appears to be doing well currently in regard to his wound in general. He has been tolerating the dressing changes without complication. Fortunately there does not appear to be any evidence of active infection the only thing that [name] [name] see right now is simply that the patient does have some issue currently with a little bit of what appears to be almost a deep tissue injury over the lateral ankle on the right side. This is the only spot that is not doing better in general. 08-16-2021 upon evaluation today patient appears to be doing well currently in regard to his wound. He has been tolerating the dressing changes without complication. Fortunately there does not appear to be any evidence of active infection locally or systemically which is great news. Unfortunately he did have a fall and has a couple new open areas including a skin tear on the right thigh. [name] [name] am hoping [name] [name] can reapproximate this. 08-30-2021 upon evaluation today patient appears to be doing well currently in regard to his wound. He has been tolerating the dressing changes without complication. Fortunately there does not appear to be any signs of infection everything seems to be healing quite nicely. [name] [name] am very pleased with where things stand at this point. 09-13-2021 upon evaluation today patient appears to be doing well currently in regard to his wounds for the most part excepting the heel on his right foot. He does have several  spots that are where he has areas that are rubbing especially on the leg which is not good. I do believe that he may benefit from switching away from traditional compression socks and using possibly a juxta lite compression wrap which we will  look into ordering for him. 09-20-2020 upon evaluation today patient presents for reevaluation here in the clinic he actually seems to be doing quite well for the most part with regard to his wounds. He has been tolerating the dressing changes without complication and overall [name] [name] am extremely pleased with where he stands currently. I do think that he is on the right track in a lot of ways here. 10-04-2021 upon evaluation today patient appears to be doing well currently in regard to some of his wounds of the toes do not appear to be doing quite as well as what [name] [name] like to see. Fortunately I do not have any signs here of active infection systemically which is great news. With that being said unfortunately the [place] [name] [name] ([id number]) [id number] Page 3 of 12 patient is having some issues here with the left second toe in particular although the right second toe is also not perfect. It does appear that the dressing has been put on a little bit too tightly. 10-11-2021 upon evaluation today patient appears to be doing decently well in regard to his wounds he does have a new wound from a fall on his right anterior portion of his leg just below the knee. This again happened as a result of him transferring. That seems to be when most of his injuries do occur. [name] [name] discussed with him and his wife today about the possibility of a left. [name] [name] think this can help and be much safer in these transfers. He has x-ray which [name] [name] did review from 10- 07-2021 was negative for any signs of osteomyelitis in regard to the toes. Electronic Signature(s) Signed: 10/11/2021 5:00:14 PM By: [name] PA-C Entered By: [name] on 10/11/2021  17:00:14 -------------------------------------------------------------------------------- Physical Exam Details Patient Name: Date of Service: [name] [name] [name], [name] V ID [name] [name]. 10/11/2021 3:15 PM Medical Record Number: [id number] Patient Account Number: [id number] Date of Birth/Sex: Treating RN: 25-Aug-1926 (88 y.o. M) Primary Care Provider: [name] Other Clinician: Referring Provider: Treating Provider/Extender: Eli Hose in Treatment: 59 Constitutional Well-nourished and well-hydrated in no acute distress. Respiratory normal breathing without difficulty. Psychiatric this patient is able to make decisions and demonstrates good insight into disease process. Alert and Oriented x 3. pleasant and cooperative. Notes Upon inspection patient's wounds again all showed signs of improvement and please pretty much across the board with where things stand I do not see any evidence of infection locally or systemically. Electronic Signature(s) Signed: 10/11/2021 5:00:34 PM By: [name] PA-C Entered By: [name] on 10/11/2021 17:00:33 -------------------------------------------------------------------------------- Physician Orders Details Patient Name: Date of Service: [name] [name] [name], [name] V ID [name] [name]. 10/11/2021 3:15 PM Medical Record Number: [id number] Patient Account Number: [id number] Date of Birth/Sex: Treating RN: 20-Mar-1926 (88 y.o. [name], [name] Primary Care Provider: [name] Other Clinician: Referring Provider: Treating Provider/Extender: Eli Hose in Treatment: 95 Verbal / Phone Orders: No Diagnosis Coding ICD-10 Coding Code Description L89.613 Pressure ulcer of right heel, stage 3 I73.89 Other specified peripheral vascular diseases I87.2 Venous insufficiency (chronic) (peripheral) L97.312 Non-pressure chronic ulcer of right ankle with fat layer exposed L97.822 Non-pressure chronic ulcer of other part of left lower leg with fat  layer exposed [name] [name] [name], [name] [name] [name] ([id number]) [id number] Page 4 of 12 L89.153 Pressure ulcer of sacral region, stage 3 S71.111D Laceration without foreign body, right thigh, subsequent encounter T81.31XA Disruption of external operation (surgical) wound, not elsewhere classified, initial encounter L97.512 Non-pressure  chronic ulcer of other part of right foot with fat layer exposed L97.522 Non-pressure chronic ulcer of other part of left foot with fat layer exposed N40.1 Benign prostatic hyperplasia with lower urinary tract symptoms N18.30 Chronic kidney disease, stage 3 unspecified I10 Essential (primary) hypertension Follow-up Appointments ppointment in 2 weeks. Jeri Cos, PA and [place], Room 8 Wednesday Return A Other: - [employer]- supplies. Wear shoes. Anesthetic (In clinic) Topical Lidocaine 5% applied to wound bed Bathing/ Shower/ Hygiene May shower and wash wound with soap and water. - with dressing changes. Edema Control - Lymphedema / SCD / Other Lymphedema Pumps. Use Lymphedema pumps on leg(s) 2-3 times a day for 45-60 minutes. If wearing any wraps or hose, do not remove them. Continue exercising as instructed. - 2 times a day for an hour each time. Elevate legs to the level of the heart or above for 30 minutes daily and/or when sitting, a frequency of: - throughout the day. Avoid standing for long periods of time. Patient to wear own compression stockings every day. - wear juxtalites HD Moisturize legs daily. - both legs and feet with dressing changes. Compression stocking or Garment 20-30 mm/Hg pressure to: - juxtalite HD apply in the morning and remove at night. Off-Loading Other: - bunny boots to both feet while resting in bed or chair. If unable to use bunny boots use pillow to elevate both legs to offload pressure to heels. Ensure no pressure to wounds to aid in healing. Additional Orders / Instructions Other: - May continue to walk and  work with physical therapy. apply xeroform to the skin tear areas to arms for protection. Wound Treatment Wound #14 - Lower Leg Wound Laterality: Left, Anterior Cleanser: Soap and Water 3 x Per Week/30 Days Discharge Instructions: May shower and wash wound with dial antibacterial soap and water prior to dressing change. Peri-Wound Care: Skin Prep (Generic) 3 x Per Week/30 Days Discharge Instructions: Use skin prep as directed Prim Dressing: Promogran Prisma Matrix, 4.34 (sq in) (silver collagen) (Dispense As Written) 3 x Per Week/30 Days ary Discharge Instructions: Moisten collagen with hydrogel or KY jelly. Secondary Dressing: Woven Gauze Sponge, Non-Sterile 4x4 in 3 x Per Week/30 Days Discharge Instructions: Apply over primary dressing as directed. Secured With: Coban Self-Adherent Wrap 4x5 (in/yd) 3 x Per Week/30 Days Discharge Instructions: Secure with Coban ONLY ONE LAYER AROUND LEG.**** Secured With: Kerlix Roll Sterile, 4.5x3.1 (in/yd) (Generic) 3 x Per Week/30 Days Discharge Instructions: Secure with Kerlix as directed. Secured With: 56M Medipore H Soft Cloth Surgical T ape, 4 x 10 (in/yd) 3 x Per Week/30 Days Discharge Instructions: Secure with tape as directed. Wound #15 - Calcaneus Wound Laterality: Right Cleanser: Soap and Water 3 x Per Week/30 Days Discharge Instructions: May shower and wash wound with dial antibacterial soap and water prior to dressing change. Cleanser: Wound Cleanser 3 x Per Week/30 Days Discharge Instructions: Cleanse the wound with wound cleanser prior to applying a clean dressing using gauze sponges, not tissue or cotton balls. Prim Dressing: Xeroform Occlusive Gauze Dressing, 4x4 in 3 x Per Week/30 Days ary Discharge Instructions: Apply to wound bed as instructed Secondary Dressing: ALLEVYN Heel 4 1/2in x 5 1/2in / 10.5cm x 13.5cm (Generic) 3 x Per Week/30 Days Discharge Instructions: Apply over primary dressing as directed. Secondary Dressing: Woven  Gauze Sponge, Non-Sterile 4x4 in (Generic) 3 x Per Week/30 Days Discharge Instructions: ***PLEASE SEND BULK.***Apply over primary dressing as directed. [place], [place] [name] [name] ([id number]) [id number] Page 5 of 12  Secured With: [employer], 4.5x3.1 (in/yd) (Generic) 3 x Per Week/30 Days Discharge Instructions: Secure with Kerlix as directed. Wound #20 - T Second oe Wound Laterality: Left Cleanser: Soap and Water 1 x Per Day/30 Days Discharge Instructions: May shower and wash wound with dial antibacterial soap and water prior to dressing change. Cleanser: Wound Cleanser 1 x Per Day/30 Days Discharge Instructions: Cleanse the wound with wound cleanser prior to applying a clean dressing using gauze sponges, not tissue or cotton balls. Prim Dressing: Xeroform Occlusive Gauze Dressing, 4x4 in 1 x Per Day/30 Days ary Discharge Instructions: Apply to wound bed as instructed Secondary Dressing: Woven Gauze Sponges 2x2 in 1 x Per Day/30 Days Discharge Instructions: Apply over primary dressing as directed. Secured With: Stretch Net Size 2, 10 (yds) (Generic) 1 x Per Day/30 Days Wound #21 - T Second oe Wound Laterality: Right Cleanser: Soap and Water 1 x Per Day/30 Days Discharge Instructions: May shower and wash wound with dial antibacterial soap and water prior to dressing change. Cleanser: Wound Cleanser 1 x Per Day/30 Days Discharge Instructions: Cleanse the wound with wound cleanser prior to applying a clean dressing using gauze sponges, not tissue or cotton balls. Prim Dressing: Xeroform Occlusive Gauze Dressing, 4x4 in 1 x Per Day/30 Days ary Discharge Instructions: Apply to wound bed as instructed Secondary Dressing: Woven Gauze Sponges 2x2 in 1 x Per Day/30 Days Discharge Instructions: Apply over primary dressing as directed. Secured With: Stretch Net Size 2, 10 (yds) (Generic) 1 x Per Day/30 Days Wound #22 - Achilles Wound Laterality: Right Cleanser: Soap and  Water 3 x Per Week/30 Days Discharge Instructions: May shower and wash wound with dial antibacterial soap and water prior to dressing change. Cleanser: Wound Cleanser 3 x Per Week/30 Days Discharge Instructions: Cleanse the wound with wound cleanser prior to applying a clean dressing using gauze sponges, not tissue or cotton balls. Prim Dressing: Xeroform Occlusive Gauze Dressing, 4x4 in 3 x Per Week/30 Days ary Discharge Instructions: Apply to wound bed as instructed Secondary Dressing: ABD Pad, 5x9 3 x Per Week/30 Days Discharge Instructions: Apply over primary dressing as directed. Secured With: Coban Self-Adherent Wrap 4x5 (in/yd) 3 x Per Week/30 Days Discharge Instructions: Secure with Coban as directed. Secured With: [employer], 4.5x3.1 (in/yd) (Generic) 3 x Per Week/30 Days Discharge Instructions: Secure with Kerlix as directed. Wound #23 - Lower Leg Wound Laterality: Right, Anterior Cleanser: Soap and Water 3 x Per Week/30 Days Discharge Instructions: May shower and wash wound with dial antibacterial soap and water prior to dressing change. Cleanser: Wound Cleanser 3 x Per Week/30 Days Discharge Instructions: Cleanse the wound with wound cleanser prior to applying a clean dressing using gauze sponges, not tissue or cotton balls. Prim Dressing: Xeroform Occlusive Gauze Dressing, 4x4 in 3 x Per Week/30 Days ary Discharge Instructions: Apply to wound bed as instructed Secondary Dressing: ABD Pad, 5x9 3 x Per Week/30 Days Discharge Instructions: Apply over primary dressing as directed. Secured With: Coban Self-Adherent Wrap 4x5 (in/yd) 3 x Per Week/30 Days Discharge Instructions: Secure with Coban as directed. Secured With: [employer], 4.5x3.1 (in/yd) (Generic) 3 x Per Week/30 Days Discharge Instructions: Secure with Kerlix as directed. Wound #3 - Ankle Wound Laterality: Left, Lateral Cleanser: Soap and Water 3 x Per Week/30 Days Discharge Instructions: May shower  and wash wound with dial antibacterial soap and water prior to dressing change. Largo, Jasper [name] [name] ([id number]) [id number] Page 6 of 12 Peri-Wound Care: Skin Prep (Generic) 3 x  Per Week/30 Days Discharge Instructions: Use skin prep as directed Prim Dressing: Xeroform Occlusive Gauze Dressing, 4x4 in 3 x Per Week/30 Days ary Discharge Instructions: Apply to wound bed as instructed Secondary Dressing: Woven Gauze Sponge, Non-Sterile 4x4 in 3 x Per Week/30 Days Discharge Instructions: Apply over primary dressing as directed. Secured With: Coban Self-Adherent Wrap 4x5 (in/yd) 3 x Per Week/30 Days Discharge Instructions: Secure with Coban ONLY ONE LAYER AROUND LEG.**** Secured With: Kerlix Roll Sterile, 4.5x3.1 (in/yd) (Generic) 3 x Per Week/30 Days Discharge Instructions: Secure with Kerlix as directed. Secured With: 44M Medipore H Soft Cloth Surgical T ape, 4 x 10 (in/yd) 3 x Per Week/30 Days Discharge Instructions: Secure with tape as directed. Electronic Signature(s) Signed: 10/11/2021 5:12:24 PM By: [name] PA-C Signed: 10/11/2021 6:11:16 PM By: Deon Pilling RN, BSN Entered By: Deon Pilling on 10/11/2021 16:08:49 -------------------------------------------------------------------------------- Problem List Details Patient Name: Date of Service: [name] [name] [name], [name] V ID [name] [name]. 10/11/2021 3:15 PM Medical Record Number: [id number] Patient Account Number: [id number] Date of Birth/Sex: Treating RN: [birth year] (88 y.o. M) Primary Care Provider: [name] Other Clinician: Referring Provider: Treating Provider/Extender: Eli Hose in Treatment: 54 Active Problems ICD-10 Encounter Code Description Active Date MDM Diagnosis L89.613 Pressure ulcer of right heel, stage 3 09/21/2020 No Yes I73.89 Other specified peripheral vascular diseases 09/21/2020 No Yes I87.2 Venous insufficiency (chronic) (peripheral) 11/16/2020 No Yes L97.312 Non-pressure  chronic ulcer of right ankle with fat layer exposed 11/16/2020 No Yes L97.822 Non-pressure chronic ulcer of other part of left lower leg with fat layer exposed11/16/2022 No Yes L89.153 Pressure ulcer of sacral region, stage 3 03/22/2021 No Yes S71.111D Laceration without foreign body, right thigh, subsequent encounter 05/03/2021 No Yes [name] [name] [name], [name] [name] [name] ([id number]) [id number] Page 7 of 12 T81.31XA Disruption of external operation (surgical) wound, not elsewhere classified, 07/05/2021 No Yes initial encounter L97.512 Non-pressure chronic ulcer of other part of right foot with fat layer exposed 10/04/2021 No Yes L97.522 Non-pressure chronic ulcer of other part of left foot with fat layer exposed 10/04/2021 No Yes N40.1 Benign prostatic hyperplasia with lower urinary tract symptoms 09/21/2020 No Yes N18.30 Chronic kidney disease, stage 3 unspecified 09/21/2020 No Yes I10 Essential (primary) hypertension 09/21/2020 No Yes Inactive Problems Resolved Problems ICD-10 Code Description Active Date Resolved Date L98.8 Other specified disorders of the skin and subcutaneous tissue 04/05/2021 04/05/2021 S41.111D Laceration without foreign body of right upper arm, subsequent encounter 05/03/2021 05/03/2021 S41.112A Laceration without foreign body of left upper arm, initial encounter 05/10/2021 05/10/2021 Electronic Signature(s) Signed: 10/11/2021 3:08:15 PM By: [name] PA-C Entered By: [name] on 10/11/2021 15:08:14 -------------------------------------------------------------------------------- Progress Note Details Patient Name: Date of Service: [name] [name] [name], [name] V ID [name] [name]. 10/11/2021 3:15 PM Medical Record Number: [id number] Patient Account Number: [id number] Date of Birth/Sex: Treating RN: [birth year] (87 y.o. M) Primary Care Provider: [name] Other Clinician: Referring Provider: Treating Provider/Extender: Eli Hose in Treatment: 59 Subjective Chief  Complaint Information obtained from Patient Multiple ulcers noted over the upper and lower extremities History of Present Illness (HPI) 09/21/2020 upon evaluation today patient appears to be doing somewhat poorly in regard to a wound in the right lateral heel location. This fortunately does not appear to be too bad but nonetheless is definitely a spot that we need to work on try to get healed for him. Has been here for quite some time. Fortunately there does not appear to be any evidence of active infection  at this time which is great news. With that being said I do believe that he has some evidence here of peripheral vascular disease this is minimal his ABI 0.87. Other than that he does have a history of BPH, chronic kidney disease stage III, and hypertension. Currently he has been using different medication such as Silvadene or antibiotic ointment to try to help treat things. This just is not helping out as efficiently [name] [name] [name], [name] [name] [name] ([id number]) [id number] Page 8 of 12 as it should be. Of note patient's history includes that of a triple bypass in 2010, a kyphoplasty at L1 which was done 8 months ago, unfortunately he had a fracture 2 months ago which she sustained a T7/T8 fracture to his spine which ended up with him being in the hospital. It is in that time since then that he developed this wound roughly 2 to 3 months ago is not exactly sure when. He does typically wear compression socks in the 20 to 30 mmHg range. 09/28/2020 upon evaluation today patient presents for follow-up and overall seems to be doing more poorly based on what [name] [name] am seeing currently. His ABI last time was measured at 0.87 but [name] [name] am beginning to think this may be falsely elevated due to the fact that there is more eschar and subsequently a concern here of vascular compromise that may be part of her issue. [name] [name] think that we need to avoid any additional sharp debridement and make a referral to vascular  surgery as soon as possible here. Subsequently also went ahead and did discuss the possibility of starting the patient on Santyl instead of the collagen in order to help clean up the surface of the wound this will take the place of sharp debridement currently. 10/19/2020 upon evaluation today patient's wound actually appears to be doing okay although he has an area on the medial aspect of his heel that is only been showing up for the past couple of days this is more of a blister that has ruptured. The good news is there is no deep wound here which is good. Fortunately there is no signs of active infection at this time also good news. With that being said however the blood flow was not good with an ABI of 0.67 on the right it was around 1 on the left nonetheless [name] [name] think he does need to see vascular we put that referral in last week on the 12th Dr. Dellia Nims reviewed that for me and made that recommendations. Nonetheless the patient has not heard anything from vascular yet as far as getting this scheduled. [name] [name] think this needs to be done sooner rather than later as [name] [name] am concerned about the possibility of a limb threatening situation if we do not get this under control.. 11/16/2020 upon evaluation today patient presents for follow-up regarding issues that he has been having with his bilateral lower extremities in particular. He in fact still has an issue with his right heel. This appears to be a stage III pressure ulcer at this point there is some slough noted. Subsequently he does have revascularization that is taking place on the right leg as a follow-up later this month in that regard. No repeat ABIs. Subsequently the patient also has venous stasis bilaterally which is also problematic for him at this time. He has some ulcers on the left ankle and left leg that are getting need to be addressed as well. 12/21/2020 upon evaluation today patient appears to be doing poorly in regard to his  bilateral lower  extremity ulcers. He still has areas on both lower extremities currently ineffective even some areas that were not there previous. Fortunately there is no signs of active infection at this time. No fevers, chills, nausea, vomiting, or diarrhea. 01/04/21 upon evaluation today patient actually appears to be doing quite well in regard to his wounds. Fortunately there does not appear to be any signs of active infection which is great. There is some necrotic tissue that [name] [name] think would be helpful to debride away pretty much on all wounds other than the heel which appears to be doing quite well. 01/25/2021 upon evaluation today patient appears to be doing okay in regard to his wounds. There are some measurements that are definitely smaller which is good news. Fortunately I do not see any evidence of active infection locally nor systemically at this point which is also great news. He does have a deep tissue injury which seems to be getting a bit worse on the lateral portion of his right heel. He tells me is not been using the bunny boots and he has not been using a pillow under his calves either. Nonetheless [name] [name] think this is absolutely a pressure injury and [name] [name] am very concerned about this. Also I do not want this to get a lot worse. He also tells me his hemoglobin is around 7 he is going to be seeing hematology for a transfusion either today or tomorrow most likely this will definitely help with his healing. 02/01/2021 upon evaluation today patient actually appears to be making excellent progress in regard to his wound currently. Fortunately [name] [name] feel like that all the wounds are showing signs of improvement the Iodoflex seems to be doing an awesome job. [name] [name] am very pleased with where we stand today. There does not appear to be any signs of active infection. 02/22/2021 upon evaluation today patient appears to be doing well with regard to his wound. He has been tolerating the dressing changes without  complication. Fortunately there does not appear to be any evidence of active infection locally or systemically at this point which is good news. Overall [name] [name] think that we are headed in the right direction and the Iodoflex is doing a good job. 03/08/2021 upon evaluation today patient appears to be doing well all things considered with regard to his wounds. He has been tolerating the dressing changes without complication. Fortunately there does not appear to be any evidence of active infection locally nor systemically which is great news overall [name] [name] think the Iodosorb is doing an awesome job here. 03/22/2021 upon evaluation today patient appears to be doing well with regard to his wounds for the most part. The biggest issue is that he does have a new wound on his gluteal region really more sacral than anything to be honest. Unfortunately this is going to be something that we need to address sooner rather than later. It does not appear to be too bad right now but obviously things can get significantly worse. 04-05-2021 upon evaluation today patient actually appears to be doing well in general in regard to his wounds. He does have an area growing on the left anterior shin which is somewhat concerning in fact [name] [name] am wondering about the possibility of even a basal cell carcinoma. With that being said [name] [name] did discuss this with the patient and his wife today [name] [name] think that a biopsy may be the ideal thing to do. 04-19-2021 upon evaluation today patient's wounds are actually showing signs of improvement. The  biopsy report [name] [name] did review which showed squamous cell carcinoma we have actually referred him to dermatology he has a appointment with the skin surgery center which is actually for the beginning of May. With that being said [name] [name] think that is absolutely appropriate obviously the sooner that he can get this done the better. Overall [name] [name] am extremely pleased with how things are going and how his wounds are improving. 05-03-2021  upon evaluation today patient's original wounds actually appear to be doing better he does have his upcoming appointment with a Mohs surgeon and hopefully they will be able to get this taken care of as far as that wound is concerned. With that being said with regard to his other wounds I do believe the collagen is doing quite well. Unfortunately he did get a new motorized wheelchair and got this to close to the edge of a curb and actually had a fall where he was thrown from it. This happens because to areas of skin tear 1 over the right thigh and 1 in the right upper arm both of which are still showing some signs of bleeding weeping this happened on Saturday. The patient obviously is in good spirits but nonetheless has a little frustrated with that situation. 05-10-2021 upon evaluation today patient appears to be doing well with regard to his lower extremity ulcers all of which are either healed or doing better. Unfortunately in regard to his wound on the right thigh this is what has been the most concern. [name] [name] am afraid that this hematoma may actually end up opening into a deeper wound. This is something we can have to keep a close eye on. With that being said I do believe that the right arm is healed and the left arm does exhibit issues here with a skin tear currently which again I do believe is new he hit this in the bathroom this past Saturday. 05-17-2021 upon evaluation today patient appears to be doing well with regard to his wounds. [name] [name] am actually extremely pleased to see that the wound on his right thigh does not appear to be opening into a much deeper wound. The area of eschar did come off but it appears to be superficial right now hoping it stays this way. Fortunately I do not see any evidence of infection which is great news about the wounds on the lower extremity right and left appear to be doing awesome. There is can be a need for a light sharp debridement at both locations today. 06-21-2021 upon  evaluation today patient appears to be doing well all things considered. He did have a stroke and subsequently was in the hospital for a time that is why [name] [name] have not seen him since May 17. The good news is nothing is worse and he seems to be making good progress here with his wounds he also had surgery at the skin surgery center and though there is some slough and biofilm buildup on the surface of this wound he is definitely seeing some improvement with granulation tissue here and very pleased in that regard. 07-05-2021 upon evaluation today patient appears to be doing well currently in regard to his wounds in general although he is having a lot more drainage than he has previously had. Fortunately there does not appear to be any evidence of active infection locally or systemically which is great news. No fevers, chills, nausea, vomiting, or diarrhea. 07-19-2021 upon evaluation today patient appears to be doing well with regard to his wound.  He has been tolerating the dressing changes and all the wounds that [name] [name] [name], [name] [name] [name] ([id number]) [id number] Page 9 of 12 we were dealing with last time are doing better which is great news. Unfortunately he has 2 new areas her right heel as well as his right lateral leg where he has a skin tear of the leg and a pressure injury to the heel. Fortunately I do not see any evidence of active infection locally or systemically at this time. 08-02-2021 upon evaluation today patient appears to be doing well currently in regard to his wound in general. He has been tolerating the dressing changes without complication. Fortunately there does not appear to be any evidence of active infection the only thing that [name] [name] see right now is simply that the patient does have some issue currently with a little bit of what appears to be almost a deep tissue injury over the lateral ankle on the right side. This is the only spot that is not doing better in  general. 08-16-2021 upon evaluation today patient appears to be doing well currently in regard to his wound. He has been tolerating the dressing changes without complication. Fortunately there does not appear to be any evidence of active infection locally or systemically which is great news. Unfortunately he did have a fall and has a couple new open areas including a skin tear on the right thigh. [name] [name] am hoping [name] [name] can reapproximate this. 08-30-2021 upon evaluation today patient appears to be doing well currently in regard to his wound. He has been tolerating the dressing changes without complication. Fortunately there does not appear to be any signs of infection everything seems to be healing quite nicely. [name] [name] am very pleased with where things stand at this point. 09-13-2021 upon evaluation today patient appears to be doing well currently in regard to his wounds for the most part excepting the heel on his right foot. He does have several spots that are where he has areas that are rubbing especially on the leg which is not good. I do believe that he may benefit from switching away from traditional compression socks and using possibly a juxta lite compression wrap which we will look into ordering for him. 09-20-2020 upon evaluation today patient presents for reevaluation here in the clinic he actually seems to be doing quite well for the most part with regard to his wounds. He has been tolerating the dressing changes without complication and overall [name] [name] am extremely pleased with where he stands currently. I do think that he is on the right track in a lot of ways here. 10-04-2021 upon evaluation today patient appears to be doing well currently in regard to some of his wounds of the toes do not appear to be doing quite as well as what [name] [name] like to see. Fortunately I do not have any signs here of active infection systemically which is great news. With that being said unfortunately the patient is having some issues here  with the left second toe in particular although the right second toe is also not perfect. It does appear that the dressing has been put on a little bit too tightly. 10-11-2021 upon evaluation today patient appears to be doing decently well in regard to his wounds he does have a new wound from a fall on his right anterior portion of his leg just below the knee. This again happened as a result of him transferring. That seems to be when most of his injuries do occur. [name] [name] discussed with  him and his wife today about the possibility of a left. [name] [name] think this can help and be much safer in these transfers. He has x-ray which [name] [name] did review from 10- 07-2021 was negative for any signs of osteomyelitis in regard to the toes. Objective Constitutional Well-nourished and well-hydrated in no acute distress. Vitals Time Taken: 3:35 PM, Height: 72 in, Weight: 223 lbs, BMI: 30.2, Temperature: 98 F, Pulse: 65 bpm, Respiratory Rate: 20 breaths/min, Blood Pressure: 136/71 mmHg. Respiratory normal breathing without difficulty. Psychiatric this patient is able to make decisions and demonstrates good insight into disease process. Alert and Oriented x 3. pleasant and cooperative. General Notes: Upon inspection patient's wounds again all showed signs of improvement and please pretty much across the board with where things stand I do not see any evidence of infection locally or systemically. Integumentary (Hair, Skin) Wound #14 status is Open. Original cause of wound was Surgical Injury. The date acquired was: 05/26/2021. The wound has been in treatment 14 weeks. The wound is located on the Left,Anterior Lower Leg. The wound measures 1.8cm length x 2cm width x 0.1cm depth; 2.827cm^2 area and 0.283cm^3 volume. There is Fat Layer (Subcutaneous Tissue) exposed. There is no tunneling or undermining noted. There is a medium amount of serosanguineous drainage noted. The wound margin is epibole. There is large (67-100%) red, hyper -  granulation within the wound bed. There is no necrotic tissue within the wound bed. The periwound skin appearance had no abnormalities noted for texture. The periwound skin appearance did not exhibit: Dry/Scaly, Maceration, Atrophie Blanche, Cyanosis, Ecchymosis, Hemosiderin Staining, Mottled, Pallor, Rubor, Erythema. Periwound temperature was noted as No Abnormality. Wound #15 status is Open. Original cause of wound was Trauma. The date acquired was: 07/12/2021. The wound has been in treatment 12 weeks. The wound is located on the Right Calcaneus. The wound measures 1.4cm length x 1.5cm width x 0.2cm depth; 1.649cm^2 area and 0.33cm^3 volume. There is Fat Layer (Subcutaneous Tissue) exposed. There is no tunneling or undermining noted. There is a medium amount of serosanguineous drainage noted. The wound margin is distinct with the outline attached to the wound base. There is large (67-100%) red, pink granulation within the wound bed. There is a small (1-33%) amount of necrotic tissue within the wound bed including Adherent Slough. The periwound skin appearance had no abnormalities noted for texture. The periwound skin appearance had no abnormalities noted for moisture. The periwound skin appearance had no abnormalities noted for color. Periwound temperature was noted as No Abnormality. Wound #20 status is Open. Original cause of wound was Trauma. The date acquired was: 09/06/2021. The wound has been in treatment 3 weeks. The wound is located on the Left T Second. The wound measures 1.5cm length x 1.7cm width x 0.1cm depth; 2.003cm^2 area and 0.2cm^3 volume. There is no tunneling or oe undermining noted. There is a medium amount of serosanguineous drainage noted. The wound margin is distinct with the outline attached to the wound base. There is small (1-33%) pink granulation within the wound bed. There is a large (67-100%) amount of necrotic tissue within the wound bed including Adherent Slough. The  periwound skin appearance had no abnormalities noted for color. The periwound skin appearance exhibited: Maceration. The periwound skin appearance did not exhibit: Callus, Crepitus, Excoriation, Induration, Rash, Scarring, Dry/Scaly. Wound #21 status is Open. Original cause of wound was Trauma. The date acquired was: 09/13/2021. The wound has been in treatment 3 weeks. The wound is located on the Right T Second. The  wound measures 0.5cm length x 0.5cm width x 0.1cm depth; 0.196cm^2 area and 0.02cm^3 volume. There is Fat Layer oe (Subcutaneous Tissue) exposed. There is no tunneling or undermining noted. There is a medium amount of serosanguineous drainage noted. The wound margin is distinct with the outline attached to the wound base. There is small (1-33%) pink granulation within the wound bed. There is a large (67-100%) amount of necrotic tissue within the wound bed including Adherent Slough. The periwound skin appearance exhibited: Maceration, Ecchymosis. The periwound skin appearance did not exhibit: Callus, Crepitus, Excoriation, Induration, Rash, Scarring, Dry/Scaly, Atrophie Blanche, Cyanosis, Hemosiderin Staining, Mottled, Pallor, Rubor, Erythema. [name] [name] [name], [name] [name] [name] ([id number]) [id number] Page 10 of 12 Wound #22 status is Open. Original cause of wound was Gradually Appeared. The date acquired was: 09/27/2021. The wound has been in treatment 1 weeks. The wound is located on the Right Achilles. The wound measures 0.6cm length x 1.2cm width x 0.2cm depth; 0.565cm^2 area and 0.113cm^3 volume. There is Fat Layer (Subcutaneous Tissue) exposed. There is no tunneling or undermining noted. There is a medium amount of serosanguineous drainage noted. The wound margin is distinct with the outline attached to the wound base. There is large (67-100%) red, pink granulation within the wound bed. There is a small (1- 33%) amount of necrotic tissue within the wound bed including Adherent  Slough. The periwound skin appearance did not exhibit: Callus, Crepitus, Excoriation, Induration, Rash, Scarring, Dry/Scaly, Maceration, Atrophie Blanche, Cyanosis, Ecchymosis, Hemosiderin Staining, Mottled, Pallor, Rubor, Erythema. Periwound temperature was noted as No Abnormality. Wound #23 status is Open. Original cause of wound was Trauma. The date acquired was: 10/05/2021. The wound is located on the Right,Anterior Lower Leg. The wound measures 1.8cm length x 1.3cm width x 0.1cm depth; 1.838cm^2 area and 0.184cm^3 volume. There is Fat Layer (Subcutaneous Tissue) exposed. There is no tunneling or undermining noted. There is a medium amount of serosanguineous drainage noted. The wound margin is distinct with the outline attached to the wound base. There is large (67-100%) pink granulation within the wound bed. There is a small (1-33%) amount of necrotic tissue within the wound bed including Adherent Slough. The periwound skin appearance did not exhibit: Callus, Crepitus, Excoriation, Induration, Rash, Scarring, Dry/Scaly, Maceration, Atrophie Blanche, Cyanosis, Ecchymosis, Hemosiderin Staining, Mottled, Pallor, Rubor, Erythema. Wound #3 status is Open. Original cause of wound was Gradually Appeared. The date acquired was: 10/12/2020. The wound has been in treatment 47 weeks. The wound is located on the Left,Lateral Ankle. The wound measures 0cm length x 0cm width x 0cm depth; 0cm^2 area and 0cm^3 volume. There is no tunneling or undermining noted. There is a none present amount of drainage noted. The wound margin is epibole. There is no granulation within the wound bed. There is no necrotic tissue within the wound bed. The periwound skin appearance did not exhibit: Callus, Crepitus, Excoriation, Induration, Rash, Scarring, Dry/Scaly, Maceration, Atrophie Blanche, Cyanosis, Ecchymosis, Hemosiderin Staining, Mottled, Pallor, Rubor, Erythema. 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 Jeri Cos, PA and [place], Room 8 Wednesday Other: [employer]- supplies. Wear shoes. Anesthetic: (In clinic) Topical Lidocaine 5% applied to wound bed Bathing/ Shower/ Hygiene: May shower and wash wound with soap and water. - with dressing changes. Edema Control - Lymphedema / SCD / Other: Lymphedema Pumps. Use Lymphedema pumps on leg(s) 2-3 times a day for 45-60 minutes. If wearing any wraps or hose, do not remove them. Continue exercising as instructed. - 2 times a day for an hour each time. Elevate legs to the level of the heart or above for 30 minutes daily and/or when sitting, a frequency of: - throughout the day. Avoid standing for long periods of time. Patient to wear own compression stockings every day. - wear juxtalites HD Moisturize legs daily. - both legs and feet with dressing changes. Compression stocking or Garment 20-30 mm/Hg pressure to: - juxtalite HD apply in the morning and remove at night. Off-Loading: Other: - bunny boots to both feet while resting in bed or chair. If unable to use bunny boots use pillow to elevate both legs to offload pressure to heels.  Ensure no pressure to wounds to aid in healing. Additional Orders / Instructions: Other: - May continue to walk and work with physical therapy. apply xeroform to the skin tear areas to arms for protection. WOUND #14: - Lower Leg Wound Laterality: Left, Anterior Cleanser: Soap and Water 3 x Per Week/30 Days Discharge Instructions: May shower and wash wound with dial antibacterial soap and water prior to dressing change. Peri-Wound Care: Skin Prep (Generic) 3 x Per Week/30 Days Discharge Instructions: Use skin prep as directed Prim Dressing: Promogran Prisma Matrix, 4.34 (sq in) (silver collagen) (Dispense As Written) 3 x Per Week/30 Days ary Discharge Instructions: Moisten collagen with hydrogel or KY jelly. Secondary Dressing: Woven Gauze Sponge, Non-Sterile 4x4 in 3 x Per Week/30 Days Discharge Instructions: Apply over primary dressing as directed. Secured With: Coban Self-Adherent Wrap 4x5 (in/yd) 3 x Per Week/30 Days Discharge Instructions: Secure with Coban ONLY ONE LAYER AROUND LEG.**** Secured With: Kerlix Roll Sterile, 4.5x3.1 (in/yd) (Generic) 3 x Per Week/30 Days Discharge Instructions: Secure with Kerlix as directed. Secured With: 64M Medipore H Soft Cloth Surgical T ape, 4 x 10 (in/yd) 3 x Per Week/30 Days Discharge Instructions: Secure with tape as directed. WOUND #15: - Calcaneus Wound Laterality: Right Cleanser: Soap and Water 3 x Per Week/30 Days Discharge Instructions: May shower and wash wound with dial antibacterial soap and water prior to dressing change. Cleanser: Wound Cleanser 3 x Per Week/30 Days [name] [name] [name], [name] [name] [name] ([id number]) [id number] Page 11 of 12 Discharge Instructions: Cleanse the wound with wound cleanser prior to applying a clean dressing using gauze sponges, not tissue or cotton balls. Prim Dressing: Xeroform Occlusive Gauze Dressing, 4x4 in 3 x Per Week/30 Days ary Discharge Instructions: Apply to wound bed as instructed Secondary  Dressing: ALLEVYN Heel 4 1/2in x 5 1/2in / 10.5cm x 13.5cm (Generic) 3 x Per Week/30 Days Discharge Instructions: Apply over primary dressing as directed. Secondary Dressing: Woven Gauze Sponge, Non-Sterile 4x4 in (Generic) 3 x Per Week/30 Days Discharge Instructions: ***PLEASE SEND BULK.***Apply over primary dressing as directed. Secured With: [employer], 4.5x3.1 (in/yd) (Generic) 3 x Per Week/30 Days Discharge Instructions: Secure with Kerlix as directed. WOUND #20: - T Second Wound Laterality: Left oe Cleanser: Soap  and Water 1 x Per Day/30 Days Discharge Instructions: May shower and wash wound with dial antibacterial soap and water prior to dressing change. Cleanser: Wound Cleanser 1 x Per Day/30 Days Discharge Instructions: Cleanse the wound with wound cleanser prior to applying a clean dressing using gauze sponges, not tissue or cotton balls. Prim Dressing: Xeroform Occlusive Gauze Dressing, 4x4 in 1 x Per Day/30 Days ary Discharge Instructions: Apply to wound bed as instructed Secondary Dressing: Woven Gauze Sponges 2x2 in 1 x Per Day/30 Days Discharge Instructions: Apply over primary dressing as directed. Secured With: Stretch Net Size 2, 10 (yds) (Generic) 1 x Per Day/30 Days WOUND #21: - T Second Wound Laterality: Right oe Cleanser: Soap and Water 1 x Per Day/30 Days Discharge Instructions: May shower and wash wound with dial antibacterial soap and water prior to dressing change. Cleanser: Wound Cleanser 1 x Per Day/30 Days Discharge Instructions: Cleanse the wound with wound cleanser prior to applying a clean dressing using gauze sponges, not tissue or cotton balls. Prim Dressing: Xeroform Occlusive Gauze Dressing, 4x4 in 1 x Per Day/30 Days ary Discharge Instructions: Apply to wound bed as instructed Secondary Dressing: Woven Gauze Sponges 2x2 in 1 x Per Day/30 Days Discharge Instructions: Apply over primary dressing as directed. Secured With: [employer] Size 2, 10  (yds) (Generic) 1 x Per Day/30 Days WOUND #22: - Achilles Wound Laterality: Right Cleanser: Soap and Water 3 x Per Week/30 Days Discharge Instructions: May shower and wash wound with dial antibacterial soap and water prior to dressing change. Cleanser: Wound Cleanser 3 x Per Week/30 Days Discharge Instructions: Cleanse the wound with wound cleanser prior to applying a clean dressing using gauze sponges, not tissue or cotton balls. Prim Dressing: Xeroform Occlusive Gauze Dressing, 4x4 in 3 x Per Week/30 Days ary Discharge Instructions: Apply to wound bed as instructed Secondary Dressing: ABD Pad, 5x9 3 x Per Week/30 Days Discharge Instructions: Apply over primary dressing as directed. Secured With: Coban Self-Adherent Wrap 4x5 (in/yd) 3 x Per Week/30 Days Discharge Instructions: Secure with Coban as directed. Secured With: [employer], 4.5x3.1 (in/yd) (Generic) 3 x Per Week/30 Days Discharge Instructions: Secure with Kerlix as directed. WOUND #23: - Lower Leg Wound Laterality: Right, Anterior Cleanser: Soap and Water 3 x Per Week/30 Days Discharge Instructions: May shower and wash wound with dial antibacterial soap and water prior to dressing change. Cleanser: Wound Cleanser 3 x Per Week/30 Days Discharge Instructions: Cleanse the wound with wound cleanser prior to applying a clean dressing using gauze sponges, not tissue or cotton balls. Prim Dressing: Xeroform Occlusive Gauze Dressing, 4x4 in 3 x Per Week/30 Days ary Discharge Instructions: Apply to wound bed as instructed Secondary Dressing: ABD Pad, 5x9 3 x Per Week/30 Days Discharge Instructions: Apply over primary dressing as directed. Secured With: Coban Self-Adherent Wrap 4x5 (in/yd) 3 x Per Week/30 Days Discharge Instructions: Secure with Coban as directed. Secured With: [employer], 4.5x3.1 (in/yd) (Generic) 3 x Per Week/30 Days Discharge Instructions: Secure with Kerlix as directed. WOUND #3: - Ankle Wound  Laterality: Left, Lateral Cleanser: Soap and Water 3 x Per Week/30 Days Discharge Instructions: May shower and wash wound with dial antibacterial soap and water prior to dressing change. Peri-Wound Care: Skin Prep (Generic) 3 x Per Week/30 Days Discharge Instructions: Use skin prep as directed Prim Dressing: Xeroform Occlusive Gauze Dressing, 4x4 in 3 x Per Week/30 Days ary Discharge Instructions: Apply to wound bed as instructed Secondary Dressing: Woven Gauze Sponge, Non-Sterile 4x4 in  3 x Per Week/30 Days Discharge Instructions: Apply over primary dressing as directed. Secured With: Coban Self-Adherent Wrap 4x5 (in/yd) 3 x Per Week/30 Days Discharge Instructions: Secure with Coban ONLY ONE LAYER AROUND LEG.**** Secured With: Kerlix Roll Sterile, 4.5x3.1 (in/yd) (Generic) 3 x Per Week/30 Days Discharge Instructions: Secure with Kerlix as directed. Secured With: 37M Medipore H Soft Cloth Surgical T ape, 4 x 10 (in/yd) 3 x Per Week/30 Days Discharge Instructions: Secure with tape as directed. 1. [name] [name] am going to consolidate some of the dressings here recommends Xeroform and everything except for the anterior left lower extremity where using silver collagen is doing well at the surgery site. 2. [name] [name] am good recommend as well that the patient should discuss with his primary care provider the possibility of a Hoyer lift at home to try to help with transferring [name] [name] think this will be much better than him continuing to do this manually where he is seems to have significant falls. We will see the patient back for follow-up visit in 1 week to see where things stand if anything changes in the meantime he should contact the office and let me know. Electronic Signature(s) Signed: 10/11/2021 5:01:15 PM By: [name] PA-C Entered By: [name] on 10/11/2021 [name] [name] [name], [name] [name] [name] ([id number]) [id number] Page 12 of  12 -------------------------------------------------------------------------------- SuperBill Details Patient Name: Date of Service: [name] Ser ID [name] [name]. 10/11/2021 Medical Record Number: [id number] Patient Account Number: [id number] Date of Birth/Sex: Treating RN: Feb 23, 1926 (88 y.o. [name], [name] Primary Care Provider: [name] Other Clinician: Referring Provider: Treating Provider/Extender: Eli Hose in Treatment: 4 Diagnosis Coding ICD-10 Codes Code Description [phone] Pressure ulcer of right heel, stage 3 I73.89 Other specified peripheral vascular diseases I87.2 Venous insufficiency (chronic) (peripheral) L97.312 Non-pressure chronic ulcer of right ankle with fat layer exposed L97.822 Non-pressure chronic ulcer of other part of left lower leg with fat layer exposed L89.153 Pressure ulcer of sacral region, stage 3 S71.111D Laceration without foreign body, right thigh, subsequent encounter T81.31XA Disruption of external operation (surgical) wound, not elsewhere classified, initial encounter L97.512 Non-pressure chronic ulcer of other part of right foot with fat layer exposed L97.522 Non-pressure chronic ulcer of other part of left foot with fat layer exposed N40.1 Benign prostatic hyperplasia with lower urinary tract symptoms N18.30 Chronic kidney disease, stage 3 unspecified I10 Essential (primary) hypertension Facility Procedures : CPT4 Code: [id number] Description: 10175 - WOUND CARE VISIT-LEV 5 EST PT Modifier: Quantity: 1 Physician Procedures : CPT4 Code Description Modifier [id number] - WC PHYS LEVEL 4 - EST PT ICD-10 Diagnosis Description L89.613 Pressure ulcer of right heel, stage 3 I73.89 Other specified peripheral vascular diseases I87.2 Venous insufficiency (chronic) (peripheral)  L97.312 Non-pressure chronic ulcer of right ankle with fat layer exposed Quantity: 1 Electronic Signature(s) Signed: 10/11/2021 5:01:45 PM By: [name] PA-C Entered By: [name] on 10/11/2021 17:01:44

## 2021-10-11 NOTE — Progress Notes (Signed)
[place], Torres [name] ([id number]) [id number] Page 1 of 17 Visit Report for 10/11/2021 Arrival Information Details Patient Name: Date of Service: [name] [name] ID [name]. 10/11/2021 3:15 PM Medical Record Number: [id number] Patient Account Number: [id number] Date of Birth/Sex: Treating RN: [birth year] (88 y.o. [name] [name] Primary Care Veryl Winemiller: [name] Other Clinician: Referring Ashanti Littles: Treating Shelba Susi/Extender: Eli Hose in Treatment: 42 Visit Information History Since Last Visit Added or deleted any medications: No Patient Arrived: Wheel Chair Any new allergies or adverse reactions: No Arrival Time: 15:35 Had a fall or experienced change in Yes Accompanied By: wife activities of daily living that may affect Transfer Assistance: Manual risk of falls: Patient Identification Verified: Yes Signs or symptoms of abuse/neglect since last visito No Secondary Verification Process Completed: Yes Hospitalized since last visit: No Patient Requires Transmission-Based Precautions: No Implantable device outside of the clinic excluding No Patient Has Alerts: Yes cellular tissue based products placed in the center Patient Alerts: Patient on Blood Thinner since last visit: Has Dressing in Place as Prescribed: Yes Has Compression in Place as Prescribed: Yes Pain Present Now: No Electronic Signature(s) Signed: 10/11/2021 6:11:16 PM By: Deon Pilling RN, BSN Entered By: Deon Pilling on 10/11/2021 15:35:58 -------------------------------------------------------------------------------- Clinic Level of Care Assessment Details Patient Name: Date of Service: [name] [name], [name] [name]. 10/11/2021 3:15 PM Medical Record Number: [id number] Patient Account Number: [id number] Date of Birth/Sex: Treating RN: 03/23/1926 (88 y.o. [name] [name] Primary Care Baldwin Racicot: [name] Other Clinician: Referring Ibraheem Voris: Treating Ciin Brazzel/Extender: Eli Hose in Treatment: 55 Clinic Level of Care Assessment Items TOOL 4 Quantity Score X- 1 0 Use when only an EandM is performed on FOLLOW-UP visit ASSESSMENTS - Nursing Assessment / Reassessment X- 1 10 Reassessment of Co-morbidities (includes updates in patient status) X- 1 5 Reassessment of Adherence to Treatment Plan ASSESSMENTS - Wound and Skin A ssessment / Reassessment '[]'$  - 0 Simple Wound Assessment / Reassessment - one wound X- 7 5 Complex Wound Assessment / Reassessment - multiple wounds X- 1 10 Dermatologic / Skin Assessment (not related to wound area) ASSESSMENTS - Focused Assessment X- 2 5 Circumferential Edema Measurements - multi extremities '[]'$  - 0 Nutritional Assessment / Counseling / [name] [name], [name] [name] ([id number]) [id number] Page 2 of 17 '[]'$  - 0 Lower Extremity Assessment (monofilament, tuning fork, pulses) '[]'$  - 0 Peripheral Arterial Disease Assessment (using hand held doppler) ASSESSMENTS - Ostomy and/or Continence Assessment and Care '[]'$  - 0 Incontinence Assessment and Management '[]'$  - 0 Ostomy Care Assessment and Management (repouching, etc.) PROCESS - Coordination of Care '[]'$  - 0 Simple Patient / Family Education for ongoing care X- 1 20 Complex (extensive) Patient / Family Education for ongoing care X- 1 10 Staff obtains Programmer, systems, Records, T Results / Process Orders est '[]'$  - 0 Staff telephones HHA, Nursing Homes / Clarify orders / etc '[]'$  - 0 Routine Transfer to another Facility (non-emergent condition) '[]'$  - 0 Routine Hospital Admission (non-emergent condition) '[]'$  - 0 New Admissions / Biomedical engineer / Ordering NPWT Apligraf, etc. , '[]'$  - 0 Emergency Hospital Admission (emergent condition) '[]'$  - 0 Simple Discharge Coordination X- 1 15 Complex (extensive) Discharge Coordination PROCESS - Special Needs '[]'$  - 0 Pediatric / Minor Patient Management '[]'$  - 0 Isolation Patient  Management '[]'$  - 0 Hearing / Language / Visual special needs '[]'$  - 0 Assessment of Community assistance (transportation, D/C planning, etc.) '[]'$  - 0 Additional assistance / Altered  mentation '[]'$  - 0 Support Surface(s) Assessment (bed, cushion, seat, etc.) INTERVENTIONS - Wound Cleansing / Measurement '[]'$  - 0 Simple Wound Cleansing - one wound X- 7 5 Complex Wound Cleansing - multiple wounds X- 1 5 Wound Imaging (photographs - any number of wounds) '[]'$  - 0 Wound Tracing (instead of photographs) '[]'$  - 0 Simple Wound Measurement - one wound X- 7 5 Complex Wound Measurement - multiple wounds INTERVENTIONS - Wound Dressings X - Small Wound Dressing one or multiple wounds 3 10 X- 2 15 Medium Wound Dressing one or multiple wounds '[]'$  - 0 Large Wound Dressing one or multiple wounds '[]'$  - 0 Application of Medications - topical '[]'$  - 0 Application of Medications - injection INTERVENTIONS - Miscellaneous '[]'$  - 0 External ear exam '[]'$  - 0 Specimen Collection (cultures, biopsies, blood, body fluids, etc.) '[]'$  - 0 Specimen(s) / Culture(s) sent or taken to Lab for analysis '[]'$  - 0 Patient Transfer (multiple staff / Civil Service fast streamer / Similar devices) '[]'$  - 0 Simple Staple / Suture removal (25 or less) '[]'$  - 0 Complex Staple / Suture removal (26 or more) '[]'$  - 0 Hypo / Hyperglycemic Management (close monitor of Blood Glucose) [name] [name], [name] [name] ([id number]) [id number] Page 3 of 17 '[]'$  - 0 Ankle / Brachial Index (ABI) - do not check if billed separately X- 1 5 Vital Signs Has the patient been seen at the hospital within the last three years: Yes Total Score: 255 Level Of Care: New/Established - Level 5 Electronic Signature(s) Signed: 10/11/2021 6:11:16 PM By: Deon Pilling RN, BSN Entered By: Deon Pilling on 10/11/2021 16:10:30 -------------------------------------------------------------------------------- Encounter Discharge Information Details Patient Name: Date of  Service: [name] [name], [name] [name]. 10/11/2021 3:15 PM Medical Record Number: [id number] Patient Account Number: [id number] Date of Birth/Sex: Treating RN: 01/24/1926 (88 y.o. [name] [name] Primary Care Duilio Heritage: [name] Other Clinician: Referring Adaia Matthies: Treating Sanjay Broadfoot/Extender: Eli Hose in Treatment: 60 Encounter Discharge Information Items Discharge Condition: Stable Ambulatory Status: Wheelchair Discharge Destination: Home Transportation: Private Auto Accompanied By: wife Schedule Follow-up Appointment: Yes Clinical Summary of Care: Electronic Signature(s) Signed: 10/11/2021 6:11:16 PM By: Deon Pilling RN, BSN Entered By: Deon Pilling on 10/11/2021 16:11:02 -------------------------------------------------------------------------------- Lower Extremity Assessment Details Patient Name: Date of Service: [name] [name], [name] [name]. 10/11/2021 3:15 PM Medical Record Number: [id number] Patient Account Number: [id number] Date of Birth/Sex: Treating RN: 1926/08/13 (88 y.o. [name] [name] Primary Care Adella Manolis: [name] Other Clinician: Referring Allisha Harter: Treating Joffrey Kerce/Extender: [name] in Treatment: 55 Edema Assessment Assessed: [Left: Yes] [Right: Yes] Edema: [Left: Yes] [Right: Yes] Calf Left: Right: Point of Measurement: 34 cm From Medial Instep 37 cm 34 cm Ankle Left: Right: Point of Measurement: 11 cm From Medial Instep 24 cm 27 cm Vascular Assessment Left: [[id number] Page 4 of 17Right:] Pulses: Dorsalis Pedis Palpable: [[id number] Page 4 of 17Yes Yes] Electronic Signature(s) Signed: 10/11/2021 6:11:16 PM By: Deon Pilling RN, BSN Entered By: Deon Pilling on 10/11/2021 15:38:51 -------------------------------------------------------------------------------- Multi-Disciplinary Care Plan Details Patient Name: Date of Service: [name] [name], [name] [name].  10/11/2021 3:15 PM Medical Record Number: [id number] Patient Account Number: [id number] Date of Birth/Sex: Treating RN: 12/03/1926 (88 y.o. [name] [name] Primary Care Jaqwan Wieber: [name] Other Clinician: Referring Khamora Karan: Treating Toini Failla/Extender: Eli Hose in Treatment: 72 Active Inactive Abuse / Safety / Falls / Self Care Management Nursing Diagnoses: Potential for falls Goals: Patient will remain injury  free related to falls Date Initiated: 03/22/2021 Target Resolution Date: 12/01/2021 Goal Status: Active Interventions: Assess: immobility, friction, shearing, incontinence upon admission and as needed Assess impairment of mobility on admission and as needed per policy Assess self care needs on admission and as needed Provide education on fall prevention Notes: Nutrition Nursing Diagnoses: Potential for alteratiion in Nutrition/Potential for imbalanced nutrition Goals: Patient/caregiver agrees to and verbalizes understanding of need to obtain nutritional consultation Date Initiated: 03/22/2021 T arget Resolution Date: 12/01/2021 Goal Status: Active Interventions: Provide education on nutrition Treatment Activities: Education provided on Nutrition : 09/13/2021 Giving encouragement to exercise : 03/22/2021 Notes: Electronic Signature(s) Signed: 10/11/2021 6:11:16 PM By: Deon Pilling RN, BSN Entered By: Deon Pilling on 10/11/2021 15:49:35 [name] [name], [name] [name] ([id number]) [id number] Page 5 of 17 -------------------------------------------------------------------------------- Pain Assessment Details Patient Name: Date of Service: [name] [name] ID [name]. 10/11/2021 3:15 PM Medical Record Number: [id number] Patient Account Number: [id number] Date of Birth/Sex: Treating RN: Feb 12, 1926 (88 y.o. [name] [name] Primary Care Tyrell Seifer: [name] Other Clinician: Referring Taysom Glymph: Treating Tami Blass/Extender: Eli Hose in Treatment: 46 Active Problems Location of Pain Severity and Description of Pain Patient Has Paino No Site Locations Rate the pain. Current Pain Level: 0 Pain Management and Medication Current Pain Management: Medication: No Cold Application: No Rest: No Massage: No Activity: No T.E.N.S.: No Heat Application: No Leg drop or elevation: No Is the Current Pain Management Adequate: Adequate How does your wound impact your activities of daily livingo Sleep: No Bathing: No Appetite: No Relationship With Others: No Bladder Continence: No Emotions: No Bowel Continence: No Work: No Toileting: No Drive: No Dressing: No Hobbies: No Engineer, maintenance) Signed: 10/11/2021 6:11:16 PM By: Deon Pilling RN, BSN Entered By: Deon Pilling on 10/11/2021 15:36:18 -------------------------------------------------------------------------------- Patient/Caregiver Education Details Patient Name: Date of Service: [name] [name] ID [name]. 10/11/2023andnbsp3:15 PM Medical Record Number: [id number] Patient Account Number: [id number] Date of Birth/Gender: Treating RN: Nov 16, 1926 (88 y.o. [name] [name] Primary Care Physician: [name] Other Clinician: Referring Physician: Treating Physician/Extender: Eli Hose in Treatment: [address], Kyuss [name] ([id number]) [id number] Page 6 of 17 Education Assessment Education Provided To: Patient Education Topics Provided Wound/Skin Impairment: Handouts: Skin Care Do's and Dont's Methods: Explain/Verbal Responses: Reinforcements needed Electronic Signature(s) Signed: 10/11/2021 6:11:16 PM By: Deon Pilling RN, BSN Entered By: Deon Pilling on 10/11/2021 15:49:45 -------------------------------------------------------------------------------- Wound Assessment Details Patient Name: Date of Service: [name] [name], [name] [name]. 10/11/2021 3:15 PM Medical Record Number:  [id number] Patient Account Number: [id number] Date of Birth/Sex: Treating RN: 05-09-1926 (88 y.o. [name], [name] Primary Care Atiana Levier: [name] Other Clinician: Referring Daegen Berrocal: Treating Nichol Ator/Extender: Eli Hose in Treatment: 49 Wound Status Wound Number: 14 Primary Open Surgical Wound Etiology: Wound Location: Left, Anterior Lower Leg Wound Open Wounding Event: Surgical Injury Status: Date Acquired: 05/26/2021 Comorbid Cataracts, Sleep Apnea, Arrhythmia, Congestive Heart Failure, Weeks Of Treatment: 14 History: Coronary Artery Disease, Hypertension, Myocardial Infarction, Clustered Wound: No End Stage Renal Disease, Gout, Dementia, Neuropathy Photos Wound Measurements Length: (cm) 1.8 Width: (cm) 2 Depth: (cm) 0.1 Area: (cm) 2.827 Volume: (cm) 0.283 % Reduction in Area: 28% % Reduction in Volume: 63.9% Epithelialization: Medium (34-66%) Tunneling: No Undermining: No Wound Description Classification: Full Thickness Without Exposed Suppo Wound Margin: Epibole Exudate Amount: Medium Exudate Type: Serosanguineous Exudate Color: red, brown rt Structures Foul Odor After Cleansing: No Slough/Fibrino No Wound Bed Granulation Amount: Large (67-100%)  Exposed [place], Dareion [name] ([id number]) [id number] Page 7 of 17 Granulation Quality: Red, Hyper-granulation Fascia Exposed: No Necrotic Amount: None Present (0%) Fat Layer (Subcutaneous Tissue) Exposed: Yes Tendon Exposed: No Muscle Exposed: No Joint Exposed: No Bone Exposed: No Periwound Skin Texture Texture Color No Abnormalities Noted: Yes No Abnormalities Noted: No Atrophie Blanche: No Moisture Cyanosis: No No Abnormalities Noted: No Ecchymosis: No Dry / Scaly: No Erythema: No Maceration: No Hemosiderin Staining: No Mottled: No Pallor: No Rubor: No Temperature / Pain Temperature: No Abnormality Treatment Notes Wound #14 (Lower Leg)  Wound Laterality: Left, Anterior Cleanser Soap and Water Discharge Instruction: May shower and wash wound with dial antibacterial soap and water prior to dressing change. Peri-Wound Care Skin Prep Discharge Instruction: Use skin prep as directed Topical Primary Dressing Promogran Prisma Matrix, 4.34 (sq in) (silver collagen) Discharge Instruction: Moisten collagen with hydrogel or KY jelly. Secondary Dressing Woven Gauze Sponge, Non-Sterile 4x4 in Discharge Instruction: Apply over primary dressing as directed. Secured With [employer] 4x5 (in/yd) Discharge Instruction: Secure with [employer] ONLY ONE LAYER AROUND LEG.**** Kerlix Roll Sterile, 4.5x3.1 (in/yd) Discharge Instruction: Secure with Kerlix as directed. 79M Medipore H Soft Cloth Surgical T ape, 4 x 10 (in/yd) Discharge Instruction: Secure with tape as directed. Compression Wrap Compression Stockings Add-Ons Electronic Signature(s) Signed: 10/11/2021 6:11:16 PM By: Deon Pilling RN, BSN Entered By: Deon Pilling on 10/11/2021 15:53:17 -------------------------------------------------------------------------------- Wound Assessment Details Patient Name: Date of Service: [name] [name], [name] [name]. 10/11/2021 3:15 PM Medical Record Number: [id number] Patient Account Number: [id number] Date of Birth/Sex: Treating RN: Oct 13, 1926 (88 y.o. [name] [name] Primary Care Camiya Vinal: [name] Other Clinician: Carl Best [name] ([id number]) [id number] Page 8 of 17 Referring Aarohi Redditt: Treating Alizae Bechtel/Extender: [name] in Treatment: 5 Wound Status Wound Number: 15 Primary Pressure Ulcer Etiology: Wound Location: Right Calcaneus Secondary Abrasion Wounding Event: Trauma Etiology: Date Acquired: 07/12/2021 Wound Open Weeks Of Treatment: 12 Status: Clustered Wound: No Comorbid Cataracts, Sleep Apnea, Arrhythmia, Congestive Heart Failure, History: Coronary Artery  Disease, Hypertension, Myocardial Infarction, End Stage Renal Disease, Gout, Dementia, Neuropathy Photos Wound Measurements Length: (cm) 1.4 Width: (cm) 1.5 Depth: (cm) 0.2 Area: (cm) 1.649 Volume: (cm) 0.33 % Reduction in Area: 84.7% % Reduction in Volume: 69.4% Epithelialization: Medium (34-66%) Tunneling: No Undermining: No Wound Description Classification: Category/Stage III Wound Margin: Distinct, outline attached Exudate Amount: Medium Exudate Type: Serosanguineous Exudate Color: red, brown Foul Odor After Cleansing: No Slough/Fibrino Yes Wound Bed Granulation Amount: Large (67-100%) Exposed Structure Granulation Quality: Red, Pink Fascia Exposed: No Necrotic Amount: Small (1-33%) Fat Layer (Subcutaneous Tissue) Exposed: Yes Necrotic Quality: Adherent Slough Tendon Exposed: No Muscle Exposed: No Joint Exposed: No Bone Exposed: No Periwound Skin Texture Texture Color No Abnormalities Noted: Yes No Abnormalities Noted: Yes Moisture Temperature / Pain No Abnormalities Noted: Yes Temperature: No Abnormality Treatment Notes Wound #15 (Calcaneus) Wound Laterality: Right Cleanser Soap and Water Discharge Instruction: May shower and wash wound with dial antibacterial soap and water prior to dressing change. Wound Cleanser Discharge Instruction: Cleanse the wound with wound cleanser prior to applying a clean dressing using gauze sponges, not tissue or cotton balls. Peri-Wound Care Topical Primary Dressing Xeroform Occlusive Gauze Dressing, 4x4 in Loop, LEONIDAS [name] ([id number]) [id number] Page 9 of 17 Discharge Instruction: Apply to wound bed as instructed Secondary Dressing ALLEVYN Heel 4 1/2in x 5 1/2in / 10.5cm x 13.5cm Discharge Instruction: Apply over primary dressing as directed. Woven Gauze Sponge, Non-Sterile 4x4 in Discharge Instruction: ***PLEASE  SEND BULK.***Apply over primary dressing as directed. Secured With [employer], 4.5x3.1 (in/yd) Discharge Instruction: Secure with Kerlix as directed. Compression Wrap Compression Stockings Add-Ons Electronic Signature(s) Signed: 10/11/2021 6:11:16 PM By: Deon Pilling RN, BSN Entered By: Deon Pilling on 10/11/2021 15:53:36 -------------------------------------------------------------------------------- Wound Assessment Details Patient Name: Date of Service: [name] [name], [name] [name]. 10/11/2021 3:15 PM Medical Record Number: [id number] Patient Account Number: [id number] Date of Birth/Sex: Treating RN: June 07, 1926 (88 y.o. [name], [name] Primary Care Nielle Duford: [name] Other Clinician: Referring Latreece Mochizuki: Treating Charrisse Masley/Extender: [name] in Treatment: 49 Wound Status Wound Number: 20 Primary Abrasion Etiology: Wound Location: Left T Second oe Wound Open Wounding Event: Trauma Status: Date Acquired: 09/06/2021 Comorbid Cataracts, Sleep Apnea, Arrhythmia, Congestive Heart Failure, Weeks Of Treatment: 3 History: Coronary Artery Disease, Hypertension, Myocardial Infarction, Clustered Wound: No End Stage Renal Disease, Gout, Dementia, Neuropathy Photos Wound Measurements Length: (cm) 1.5 Width: (cm) 1.7 Depth: (cm) 0.1 Area: (cm) 2.003 Volume: (cm) 0.2 % Reduction in Area: -1175.8% % Reduction in Volume: -1150% Epithelialization: None Tunneling: No Undermining: No Wound Description Classification: Full Thickness Without Exposed Support Structures Wound Margin: Distinct, outline attached Exudate Amount: Medium Exudate Type: Serosanguineous Exudate Color: red, brown NICOLAAS, [name] [name] ([id number]) Foul Odor After Cleansing: No Slough/Fibrino Yes [id number] Page 10 of 17 Wound Bed Granulation Amount: Small (1-33%) Exposed Structure Granulation Quality: Pink Fascia Exposed: No Necrotic Amount: Large (67-100%) Fat Layer (Subcutaneous Tissue) Exposed: No Necrotic Quality: Adherent  Slough Tendon Exposed: No Muscle Exposed: No Joint Exposed: No Bone Exposed: No Periwound Skin Texture Texture Color No Abnormalities Noted: No No Abnormalities Noted: Yes Callus: No Crepitus: No Excoriation: No Induration: No Rash: No Scarring: No Moisture No Abnormalities Noted: No Dry / Scaly: No Maceration: Yes Treatment Notes Wound #20 (Toe Second) Wound Laterality: Left Cleanser Soap and Water Discharge Instruction: May shower and wash wound with dial antibacterial soap and water prior to dressing change. Wound Cleanser Discharge Instruction: Cleanse the wound with wound cleanser prior to applying a clean dressing using gauze sponges, not tissue or cotton balls. Peri-Wound Care Topical Primary Dressing Xeroform Occlusive Gauze Dressing, 4x4 in Discharge Instruction: Apply to wound bed as instructed Secondary Dressing Woven Gauze Sponges 2x2 in Discharge Instruction: Apply over primary dressing as directed. Secured With [employer] Size 2, 10 (yds) Compression Wrap Compression Stockings Add-Ons Electronic Signature(s) Signed: 10/11/2021 6:11:16 PM By: Deon Pilling RN, BSN Entered By: Deon Pilling on 10/11/2021 15:54:01 -------------------------------------------------------------------------------- Wound Assessment Details Patient Name: Date of Service: [name] [name], [name] [name]. 10/11/2021 3:15 PM Medical Record Number: [id number] Patient Account Number: [id number] Date of Birth/Sex: Treating RN: 06-Jan-1926 (88 y.o. [name] [name] Primary Care Iley Deignan: [name] Other Clinician: Referring Zuha Dejonge: Treating Clee Pandit/Extender: Eli Hose in Treatment: [address], Audrey [name] ([id number]) [id number] Page 11 of 17 Wound Status Wound Number: 21 Primary Abrasion Etiology: Wound Location: Right T Second oe Wound Open Wounding Event: Trauma Status: Date Acquired: 09/13/2021 Comorbid Cataracts, Sleep Apnea,  Arrhythmia, Congestive Heart Failure, Weeks Of Treatment: 3 History: Coronary Artery Disease, Hypertension, Myocardial Infarction, Clustered Wound: No End Stage Renal Disease, Gout, Dementia, Neuropathy Photos Wound Measurements Length: (cm) 0.5 Width: (cm) 0.5 Depth: (cm) 0.1 Area: (cm) 0.196 Volume: (cm) 0.02 % Reduction in Area: -24.8% % Reduction in Volume: -25% Epithelialization: None Tunneling: No Undermining: No Wound Description Classification: Full Thickness Without Exposed Suppo Wound Margin: Distinct, outline attached Exudate Amount: Medium Exudate Type: Serosanguineous  Exudate Color: red, brown rt Structures Foul Odor After Cleansing: No Slough/Fibrino Yes Wound Bed Granulation Amount: Small (1-33%) Exposed Structure Granulation Quality: Pink Fascia Exposed: No Necrotic Amount: Large (67-100%) Fat Layer (Subcutaneous Tissue) Exposed: Yes Necrotic Quality: Adherent Slough Tendon Exposed: No Muscle Exposed: No Joint Exposed: No Bone Exposed: No Periwound Skin Texture Texture Color No Abnormalities Noted: No No Abnormalities Noted: No Callus: No Atrophie Blanche: No Crepitus: No Cyanosis: No Excoriation: No Ecchymosis: Yes Induration: No Erythema: No Rash: No Hemosiderin Staining: No Scarring: No Mottled: No Pallor: No Moisture Rubor: No No Abnormalities Noted: No Dry / Scaly: No Maceration: Yes Treatment Notes Wound #21 (Toe Second) Wound Laterality: Right Cleanser Soap and Water Discharge Instruction: May shower and wash wound with dial antibacterial soap and water prior to dressing change. Wound Cleanser Discharge Instruction: Cleanse the wound with wound cleanser prior to applying a clean dressing using gauze sponges, not tissue or cotton balls. [place], [place] [name] ([id number]) [id number] Page 12 of 17 Topical Primary Dressing Xeroform Occlusive Gauze Dressing, 4x4 in Discharge Instruction: Apply  to wound bed as instructed Secondary Dressing Woven Gauze Sponges 2x2 in Discharge Instruction: Apply over primary dressing as directed. Secured With [employer] Size 2, 10 (yds) Compression Wrap Compression Stockings Add-Ons Electronic Signature(s) Signed: 10/11/2021 6:11:16 PM By: Deon Pilling RN, BSN Entered By: Deon Pilling on 10/11/2021 15:55:19 -------------------------------------------------------------------------------- Wound Assessment Details Patient Name: Date of Service: [name] [name], [name] [name]. 10/11/2021 3:15 PM Medical Record Number: [id number] Patient Account Number: [id number] Date of Birth/Sex: Treating RN: 06-21-1926 (88 y.o. [name], [name] Primary Care Crimson Dubberly: [name] Other Clinician: Referring Hunt Zajicek: Treating Sabastion Hrdlicka/Extender: Eli Hose in Treatment: 16 Wound Status Wound Number: 22 Primary Lymphedema Etiology: Wound Location: Right Achilles Wound Open Wounding Event: Gradually Appeared Status: Date Acquired: 09/27/2021 Comorbid Cataracts, Sleep Apnea, Arrhythmia, Congestive Heart Failure, Weeks Of Treatment: 1 History: Coronary Artery Disease, Hypertension, Myocardial Infarction, Clustered Wound: No End Stage Renal Disease, Gout, Dementia, Neuropathy Photos Wound Measurements Length: (cm) 0.6 Width: (cm) 1.2 Depth: (cm) 0.2 Area: (cm) 0.565 Volume: (cm) 0.113 % Reduction in Area: 28% % Reduction in Volume: -43% Epithelialization: Small (1-33%) Tunneling: No Undermining: No Wound Description Classification: Full Thickness Without Exposed Support Structures Wound Margin: Distinct, outline attached Exudate Amount: Medium Exudate Type: Serosanguineous Exudate Color: red, brown [name] [name], [name] [name] ([id number]) Foul Odor After Cleansing: No Slough/Fibrino Yes [id number] Page 13 of 17 Wound Bed Granulation Amount: Large (67-100%) Exposed Structure Granulation Quality: Red,  Pink Fascia Exposed: No Necrotic Amount: Small (1-33%) Fat Layer (Subcutaneous Tissue) Exposed: Yes Necrotic Quality: Adherent Slough Tendon Exposed: No Muscle Exposed: No Joint Exposed: No Bone Exposed: No Periwound Skin Texture Texture Color No Abnormalities Noted: No No Abnormalities Noted: No Callus: No Atrophie Blanche: No Crepitus: No Cyanosis: No Excoriation: No Ecchymosis: No Induration: No Erythema: No Rash: No Hemosiderin Staining: No Scarring: No Mottled: No Pallor: No Moisture Rubor: No No Abnormalities Noted: No Dry / Scaly: No Temperature / Pain Maceration: No Temperature: No Abnormality Treatment Notes Wound #22 (Achilles) Wound Laterality: Right Cleanser Soap and Water Discharge Instruction: May shower and wash wound with dial antibacterial soap and water prior to dressing change. Wound Cleanser Discharge Instruction: Cleanse the wound with wound cleanser prior to applying a clean dressing using gauze sponges, not tissue or cotton balls. Peri-Wound Care Topical Primary Dressing Xeroform Occlusive Gauze Dressing, 4x4 in Discharge Instruction: Apply to wound bed as instructed Secondary Dressing ABD Pad, 5x9  Discharge Instruction: Apply over primary dressing as directed. Secured With [employer] 4x5 (in/yd) Discharge Instruction: Secure with Coban as directed. Kerlix Roll Sterile, 4.5x3.1 (in/yd) Discharge Instruction: Secure with Kerlix as directed. Compression Wrap Compression Stockings Add-Ons Electronic Signature(s) Signed: 10/11/2021 6:11:16 PM By: Deon Pilling RN, BSN Entered By: Deon Pilling on 10/11/2021 15:54:31 -------------------------------------------------------------------------------- Wound Assessment Details Patient Name: Date of Service: [name] [name], [name] [name]. 10/11/2021 3:15 PM Medical Record Number: [id number] Patient Account Number: [id number] [name] [name], [name] [name] ([id number]) [id number]  Page 14 of 17 Date of Birth/Sex: Treating RN: Jul 24, 1926 (88 y.o. [name], [name] Primary Care Kyan Giannone: Other Clinician: [name] Referring Ledia Hanford: Treating Zohar Maroney/Extender: Eli Hose in Treatment: 11 Wound Status Wound Number: 23 Primary Skin T ear Etiology: Wound Location: Right, Anterior Lower Leg Wound Open Wounding Event: Trauma Status: Date Acquired: 10/05/2021 Comorbid Cataracts, Sleep Apnea, Arrhythmia, Congestive Heart Failure, Weeks Of Treatment: 0 History: Coronary Artery Disease, Hypertension, Myocardial Infarction, Clustered Wound: No End Stage Renal Disease, Gout, Dementia, Neuropathy Photos Wound Measurements Length: (cm) 1.8 Width: (cm) 1.3 Depth: (cm) 0.1 Area: (cm) 1.838 Volume: (cm) 0.184 % Reduction in Area: % Reduction in Volume: Epithelialization: Small (1-33%) Tunneling: No Undermining: No Wound Description Classification: Full Thickness Without Exposed Suppo Wound Margin: Distinct, outline attached Exudate Amount: Medium Exudate Type: Serosanguineous Exudate Color: red, brown rt Structures Foul Odor After Cleansing: No Slough/Fibrino Yes Wound Bed Granulation Amount: Large (67-100%) Exposed Structure Granulation Quality: Pink Fascia Exposed: No Necrotic Amount: Small (1-33%) Fat Layer (Subcutaneous Tissue) Exposed: Yes Necrotic Quality: Adherent Slough Tendon Exposed: No Muscle Exposed: No Joint Exposed: No Bone Exposed: No Periwound Skin Texture Texture Color No Abnormalities Noted: No No Abnormalities Noted: No Callus: No Atrophie Blanche: No Crepitus: No Cyanosis: No Excoriation: No Ecchymosis: No Induration: No Erythema: No Rash: No Hemosiderin Staining: No Scarring: No Mottled: No Pallor: No Moisture Rubor: No No Abnormalities Noted: No Dry / Scaly: No Maceration: No Treatment Notes Wound #23 (Lower Leg) Wound Laterality: Right, Anterior Cleanser Soap and Water Discharge  Instruction: May shower and wash wound with dial antibacterial soap and water prior to dressing change. Wound Cleanser Discharge Instruction: Cleanse the wound with wound cleanser prior to applying a clean dressing using gauze sponges, not tissue or cotton [name] [name], [name] [name] ([id number]) [id number] Page 15 of 17 balls. Peri-Wound Care Topical Primary Dressing Xeroform Occlusive Gauze Dressing, 4x4 in Discharge Instruction: Apply to wound bed as instructed Secondary Dressing ABD Pad, 5x9 Discharge Instruction: Apply over primary dressing as directed. Secured With [employer] 4x5 (in/yd) Discharge Instruction: Secure with Coban as directed. Kerlix Roll Sterile, 4.5x3.1 (in/yd) Discharge Instruction: Secure with Kerlix as directed. Compression Wrap Compression Stockings Add-Ons Electronic Signature(s) Signed: 10/11/2021 6:11:16 PM By: Deon Pilling RN, BSN Entered By: Deon Pilling on 10/11/2021 15:56:04 -------------------------------------------------------------------------------- Wound Assessment Details Patient Name: Date of Service: [name] [name], [name] [name]. 10/11/2021 3:15 PM Medical Record Number: [id number] Patient Account Number: [id number] Date of Birth/Sex: Treating RN: 11-Jul-1926 (88 y.o. [name] [name] Primary Care Jamiyla Ishee: [name] Other Clinician: Referring Hermann Dottavio: Treating Keevin Panebianco/Extender: [name] in Treatment: 55 Wound Status Wound Number: 3 Primary Venous Leg Ulcer Etiology: Wound Location: Left, Lateral Ankle Wound Open Wounding Event: Gradually Appeared Status: Date Acquired: 10/12/2020 Comorbid Cataracts, Sleep Apnea, Arrhythmia, Congestive Heart Failure, Weeks Of Treatment: 47 History: Coronary Artery Disease, Hypertension, Myocardial Infarction, Clustered Wound: No End Stage Renal Disease, Gout, Dementia, Neuropathy Photos Wound  Measurements Length: (cm) 0 Width: (cm) 0 Depth:  (cm) 0 Area: (cm) 0 Volume: (cm) 0 [name] [name], [name] [name] ([id number]) % Reduction in Area: 100% % Reduction in Volume: 100% Epithelialization: Large (67-100%) Tunneling: No Undermining: No [id number] Page 16 of 17 Wound Description Classification: Full Thickness Without Exposed Support Structures Wound Margin: Epibole Exudate Amount: None Present Foul Odor After Cleansing: No Slough/Fibrino No Wound Bed Granulation Amount: None Present (0%) Exposed Structure Necrotic Amount: None Present (0%) Fascia Exposed: No Fat Layer (Subcutaneous Tissue) Exposed: No Tendon Exposed: No Muscle Exposed: No Joint Exposed: No Bone Exposed: No Periwound Skin Texture Texture Color No Abnormalities Noted: No No Abnormalities Noted: No Callus: No Atrophie Blanche: No Crepitus: No Cyanosis: No Excoriation: No Ecchymosis: No Induration: No Erythema: No Rash: No Hemosiderin Staining: No Scarring: No Mottled: No Pallor: No Moisture Rubor: No No Abnormalities Noted: No Dry / Scaly: No Maceration: No Treatment Notes Wound #3 (Ankle) Wound Laterality: Left, Lateral Cleanser Soap and Water Discharge Instruction: May shower and wash wound with dial antibacterial soap and water prior to dressing change. Peri-Wound Care Skin Prep Discharge Instruction: Use skin prep as directed Topical Primary Dressing Xeroform Occlusive Gauze Dressing, 4x4 in Discharge Instruction: Apply to wound bed as instructed Secondary Dressing Woven Gauze Sponge, Non-Sterile 4x4 in Discharge Instruction: Apply over primary dressing as directed. Secured With [employer] 4x5 (in/yd) Discharge Instruction: Secure with Coban ONLY ONE LAYER AROUND LEG.**** Kerlix Roll Sterile, 4.5x3.1 (in/yd) Discharge Instruction: Secure with Kerlix as directed. 77M Medipore H Soft Cloth Surgical T ape, 4 x 10 (in/yd) Discharge Instruction: Secure with tape as directed. Compression  Wrap Compression Stockings Add-Ons Electronic Signature(s) Signed: 10/11/2021 6:11:16 PM By: Deon Pilling RN, BSN Entered By: Deon Pilling on 10/11/2021 15:55:39 Buerkle, [name] [name] ([id number]) [id number] Page 17 of 33 -------------------------------------------------------------------------------- Vitals Details Patient Name: Date of Service: [name] [name] ID [name]. 10/11/2021 3:15 PM Medical Record Number: [id number] Patient Account Number: [id number] Date of Birth/Sex: Treating RN: [birth year] (88 y.o. [name] [name] Primary Care Esaias Cleavenger: [name] Other Clinician: Referring Tayshaun Kroh: Treating Nateisha Moyd/Extender: Eli Hose in Treatment: 14 Vital Signs Time Taken: 15:35 Temperature (F): 98 Height (in): 72 Pulse (bpm): 65 Weight (lbs): 223 Respiratory Rate (breaths/min): 20 Body Mass Index (BMI): 30.2 Blood Pressure (mmHg): 136/71 Reference Range: 80 - 120 mg / dl Electronic Signature(s) Signed: 10/11/2021 6:11:16 PM By: Deon Pilling RN, BSN Entered By: Deon Pilling on 10/11/2021 15:36:09

## 2021-10-11 NOTE — Progress Notes (Signed)
DESHAN, HEMMELGARN I ([id number]) [id number] Page 1 of 1 Visit Report for 10/11/2021 Fall Risk Assessment Details Patient Name: Date of Service: [name] ID I. 10/11/2021 3:15 PM Medical Record Number: [id number] Patient Account Number: [id number] Date of Birth/Sex: Treating RN: 18-Feb-1926 (88 y.o. [name] Primary Care James Senn: [name] Other Clinician: Referring Daylyn Azbill: Treating Adam Sanjuan/Extender: Eli Hose in Treatment: 23 Fall Risk Assessment Items Have you had 2 or more falls in the last 12 monthso 0 Yes Have you had any fall that resulted in injury in the last 12 monthso 0 Yes FALLS RISK SCREEN History of falling - immediate or within 3 months 25 Yes Secondary diagnosis (Do you have 2 or more medical diagnoseso) 0 No Ambulatory aid None/bed rest/wheelchair/nurse 0 No Crutches/cane/walker 0 No Furniture 0 No Intravenous therapy Access/Saline/Heparin Lock 0 No Gait/Transferring Normal/ bed rest/ wheelchair 0 No Weak (short steps with or without shuffle, stooped but able to lift head while walking, may seek 10 Yes support from furniture) Impaired (short steps with shuffle, may have difficulty arising from chair, head down, impaired 0 No balance) Mental Status Oriented to own ability 0 No Electronic Signature(s) Signed: 10/11/2021 6:11:16 PM By: Deon Pilling RN, BSN Entered By: Deon Pilling on 10/11/2021 15:49:19

## 2021-10-11 NOTE — Therapy (Signed)
OUTPATIENT OCCUPATIONAL THERAPY  TREATMENT NOTE  Patient Name: [name] [name] MRN: [id number] DOB:1926-02-26, 88 y.o., male Today's Date: 10/11/2021  PCP: [name], MD REFERRING PROVIDER: Charlett Blake, MD    OT End of Session - 10/11/21 1341     Visit Number 11    Number of Visits 17    Date for OT Re-Evaluation 10/27/21    Authorization Type BCBS Medicare    OT Start Time 1315    OT Stop Time 1358    OT Time Calculation (min) 43 min    Activity Tolerance Patient tolerated treatment well    Behavior During Therapy WFL for tasks assessed/performed            Past Medical History:  Diagnosis Date   AAA (abdominal aortic aneurysm) ([place])    Arthritis    Ascending aorta dilatation ([place]) 03/13/2012   Fusiform dilatation of the ascending thoracic aorta discovered during surgery, max diameter 4.2-4.3 cm   BPH (benign prostatic hypertrophy)    Chronic systolic heart failure ([place]) 03/03/2012   Chronic venous insufficiency    GERD (gastroesophageal reflux disease)    Glaucoma    Gout    History of kidney stones    Hyperlipidemia    Hypertension    Obesity (BMI 30-39.9)    Paroxysmal atrial fibrillation ([place]) 02/29/2012   Recurrent paroxysmal, new-onset    PMR (polymyalgia rheumatica) (HCC)    Polymyalgia rheumatica ([place]) 02/06/2019   Pre-diabetes    S/P CABG x 3 03/06/2012   LIMA to LAD, SVG to OM, SVG to RCA, EVH via right thigh   S/P Maze operation for atrial fibrillation 03/06/2012   Complete bilateral lesions set using bipolar radiofrequency and cryothermy ablation with clipping of LA appendage   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   s/p TAVR with a 26 mm Edwards via the left subclavian by Drs Buena Irish and Bartle   Severe aortic stenosis 09/29/2019   Sleep apnea    USES CPAP NIGHTLY   Past Surgical History:  Procedure Laterality Date   ABDOMINAL AORTOGRAM W/LOWER EXTREMITY N/A 10/26/2020   Procedure: ABDOMINAL AORTOGRAM W/LOWER  EXTREMITY;  Surgeon: Broadus John, MD;  Location: [facility];  Service: Cardiovascular;  Laterality: N/A;   CARDIAC CATHETERIZATION     CATARACT EXTRACTION, BILATERAL     with lens implants   CHOLECYSTECTOMY N/A 03/24/2012   Procedure: LAPAROSCOPIC CHOLECYSTECTOMY WITH INTRAOPERATIVE CHOLANGIOGRAM;  Surgeon: Zenovia Jarred, MD;  Location: [facility];  Service: General;  Laterality: N/A;   CORONARY ARTERY BYPASS GRAFT N/A 03/06/2012   Procedure: CORONARY ARTERY BYPASS GRAFTING (CABG);  Surgeon: Rexene Alberts, MD;  Location: [facility];  Service: Open Heart Surgery;  Laterality: N/A;   ENDOVEIN HARVEST OF GREATER SAPHENOUS VEIN Right 03/06/2012   Procedure: ENDOVEIN HARVEST OF GREATER SAPHENOUS VEIN;  Surgeon: Rexene Alberts, MD;  Location: [facility];  Service: Open Heart Surgery;  Laterality: Right;   INTRAOPERATIVE TRANSESOPHAGEAL ECHOCARDIOGRAM N/A 03/06/2012   Procedure: INTRAOPERATIVE TRANSESOPHAGEAL ECHOCARDIOGRAM;  Surgeon: Rexene Alberts, MD;  Location: [facility];  Service: Open Heart Surgery;  Laterality: N/A;   IR KYPHO LUMBAR INC FX REDUCE BONE BX UNI/BIL CANNULATION INC/IMAGING  02/23/2020   LEFT HEART CATH Right 03/02/2012   Procedure: LEFT HEART CATH;  Surgeon: Sherren Mocha, MD;  Location: [facility];  Service: Cardiovascular;  Laterality: Right;   MAZE N/A 03/06/2012   Procedure: MAZE;  Surgeon: Rexene Alberts, MD;  Location: [facility];  Service:  Open Heart Surgery;  Laterality: N/A;   PERIPHERAL VASCULAR INTERVENTION Right 10/26/2020   Procedure: PERIPHERAL VASCULAR INTERVENTION;  Surgeon: Broadus John, MD;  Location: [facility];  Service: Cardiovascular;  Laterality: Right;   PROSTATECTOMY     partial   RIGHT/LEFT HEART CATH AND CORONARY/GRAFT ANGIOGRAPHY N/A 09/29/2019   Procedure: RIGHT/LEFT HEART CATH AND CORONARY/GRAFT ANGIOGRAPHY;  Surgeon: Adrian Prows, MD;  Location: [facility];  Service: Cardiovascular;  Laterality: N/A;   TEE WITHOUT CARDIOVERSION N/A 11/10/2019    Procedure: TRANSESOPHAGEAL ECHOCARDIOGRAM (TEE);  Surgeon: Burnell Blanks, MD;  Location: [facility];  Service: Open Heart Surgery;  Laterality: N/A;   TRANSCATHETER AORTIC VALVE REPLACEMENT, TRANSFEMORAL  11/10/2019   Patient Active Problem List   Diagnosis Date Noted   Left middle cerebral artery stroke ([place]) 05/31/2021   Acute CVA (cerebrovascular accident) ([place]) 05/25/2021   DNR (do not resuscitate) 05/25/2021   Osteoporosis 05/05/2021   Gouty arthritis 02/13/2021   Morbid obesity ([place]) 02/13/2021   Abdominal aortic aneurysm without rupture ([place]) 11/22/2020   Acute renal failure syndrome ([place]) 11/22/2020   Low back pain 11/22/2020   Skin tear of right upper arm without complication 95/09/3265   Pressure injury of skin 10/23/2020   Urinary tract infection with hematuria 10/22/2020   Encephalopathy 10/22/2020   AKI (acute kidney injury) ([place])    Overweight (BMI 25.0-29.9) 08/19/2020   Vertebral fracture, osteoporotic ([place]) 08/18/2020   T8 vertebral fracture ([place]) 08/17/2020   Thrombocytopenia ([place]) 08/17/2020   CKD (chronic kidney disease) stage 3, GFR 30-59 ml/min (HCC) 08/17/2020   Elevated troponin 08/17/2020   Pain due to onychomycosis of toenails of both feet 07/27/2020   Callus of heel 07/27/2020   Bifascicular block 11/11/2019   Acute on chronic diastolic heart failure ([place]) 11/10/2019   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   Severe aortic stenosis 09/29/2019   Aortic valve disorder 12/45/8099   Systolic heart failure ([place]) 03/11/2019   Thrombophilia ([place]) 02/26/2019   Muscle pain 02/11/2019   Weakness 02/06/2019   Polymyalgia rheumatica ([place]) 02/06/2019   Shoulder joint pain 02/02/2019   Anemia 01/26/2019   Malignant melanoma of skin ([place]) 01/26/2019   Pain in right leg 01/26/2019   DDD (degenerative disc disease), cervical 05/28/2018   Encounter for general adult medical examination without abnormal findings 11/19/2017   Carotid artery  occlusion 07/03/2017   Hearing loss 07/03/2017   Fall 06/19/2017   Injury of upper extremity 06/19/2017   Neck pain 06/19/2017   Headache 06/19/2017   Visual disturbance 06/19/2017   Ganglion of hand 05/08/2017   Dilatation of aorta ([place]) 02/14/2017   Plantar fascial fibromatosis 02/14/2017   Cardiac murmur, unspecified 02/27/2016   Benign neoplasm of colon 08/24/2015   Dementia ([place]) 08/24/2015   Influenza due to unidentified influenza virus with other respiratory manifestations 03/15/2015   Epistaxis 12/01/2014   OSA (obstructive sleep apnea) 11/18/2014   Gastro-esophageal reflux disease with esophagitis 10/29/2014   Angina pectoris ([place]) 05/04/2014   Abnormal feces 07/31/2013   Other specified disorders of gingiva and edentulous alveolar ridge 07/31/2013   Spontaneous ecchymosis 10/10/2012   Thoracic ascending aortic aneurysm ([place]) 09/29/2012   Microscopic hematuria 04/11/2012   Proteinuria 04/11/2012   S/P laparoscopic cholecystectomy 04/09/2012   S/P CABG x 3 03/06/2012   S/P Maze operation for atrial fibrillation 03/06/2012   Paroxysmal atrial fibrillation ([place]) 02/29/2012   Bitten or stung by nonvenomous insect and other nonvenomous arthropods, initial encounter 12/03/2011   Depression  screening 10/17/2011   CAD (coronary artery disease), native coronary artery    Hyperlipidemia    Hypertension    Chronic venous insufficiency    Gout    Benign prostatic hyperplasia    Post-traumatic stress disorder 03/13/2011   Pain in left leg 12/08/2010   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   Fatigue 08/08/2010   Retention of urine 04/18/2010   Constipation 07/05/2009   Kidney stone 07/05/2009   ED (erectile dysfunction) of organic origin 12/14/2008   Impaired fasting glucose 12/14/2008   Vitamin B deficiency 12/14/2008    ONSET DATE: 05/25/2021  REFERRING DIAG: E95.284 (ICD-10-CM) - Left middle cerebral artery stroke   THERAPY DIAG:  Hemiplegia and hemiparesis following  cerebral infarction affecting right dominant side (HCC)  Muscle weakness (generalized)  Attention and concentration deficit  Unsteadiness on feet  Rationale for Evaluation and Treatment Rehabilitation  SUBJECTIVE:   SUBJECTIVE STATEMENT: Pt reports he feels like he's getting worse and his "cognition is not all there anymore" Pt accompanied by: self and significant other (wife, [name])  PERTINENT HISTORY:  H/o AAA, CAD with CABG, compression fractures, chronic anemia followed by hematology services, chronic systolic congestive heart failure, A-fib/AF status post MAZE on Xarelto, hypertension, hyperlipidemia, PMR maintained on steroids, prediabetes, early dementia maintained on Namenda, chronic lower extremity wounds with Unna boot OSA with CPAP, PAD with lymphedema status post balloon angioplasty of posterior tibial artery October 2022 recent UTI placed on antibiotic therapy.  He does have a personal care attendant Monday Wednesday Friday for 3 hours.  Modified independent transfers power wheelchair bound x3 to 4 months prior to that ambulating short distances with a rollator.  Present 05/25/2021 with right-sided weakness and aphasia of acute onset.  Cranial CT scan showed small left MCA territory cortical infarct within the mid to posterior left frontal lobe. CT angiogram head and neck no large vessel occlusion.   PRECAUTIONS: Fall - multiple falls within past 6 months; 1 on 10/05/21  PAIN Are you having pain? Yes: NPRS scale: 5/10 Pain location: back and legs Pain description: constant Aggravating factors: standing, certain movements/exercises Relieving factors: injections, laying/sitting in a comfortable position  LIVING ENVIRONMENT: Lives with: lives with their spouse Lives in: House/apartment Has following equipment at home: Environmental consultant - 2 wheeled, Environmental consultant - 4 wheeled, Wheelchair (power), Wheelchair (manual), shower chair, bed side commode, Grab bars, and elevator to access home from  outside, hand held shower head  PLOF:  Has a caregiver 3x/week that assists with ADLs, d/t spinal compression fx pt has depended more on w/c in 5-6 months  PATIENT GOALS: to talk properly; to write and to spell   OBJECTIVE:   HAND DOMINANCE: Right  ADLs: Dressing: Setup w/ UB dressing, wife assists w/ pants, socks, shoes Grooming: Mod I from seated position Eating: reports great difficulty using a knife to cut food, wife frequently cuts foods for him Toileting: Occ difficulty pulling pants back up after toileting, leans forward to complete hygiene Bathing: Supervision/setup  Tub Shower transfers: caregiver assist w/ transfers in/out of shower providing SPV and occ Min A Equipment: Shower seat with back, Grab bars, Walk in shower, bed side commode, Reacher, and Sock aid  IADLs: Financial management: Reports difficulty with writing a check, math, spelling, but did most of financial management Handwriting: 50% legible and difficulty with spelling that has gotten worse since CVA  MOBILITY STATUS: Needs Assist: Mod I stand pivot from w/c, Supervision for shower transfers  FUNCTIONAL OUTCOME MEASURES: FOTO: 58 on 09/27/21  -------------------------------------------------------------------------------------------------------------------------------------------------------- (objective measures  above completed at initial evaluation unless otherwise dated)  TODAY'S TREATMENT:  10/11/21 Patient/Family Education: OT discussed participation in ADLs, w/ pt and his spouse both reporting that pt is not very engaged w/ functional tasks at home, as well as progress toward goals and current plan of care in OT. OT provided education regarding approaches to therapy across phases of recovery, goals of OP occupational therapy, and importance of carryover of learned and practiced strategies from all disciplines to maximize benefit of therapy and overall recovery. OT encouraged pt to try compensatory  strategies for ADLs first (e.g., using a reacher to don/doff pants), before asking for help w/ pt verbalizing understanding. Questions from pt and his wife were answered and both are agreeable to continuing w/ POC at this time. Typing: OT used online Keyboard Ninja typing game, focusing on single letters to address accuracy, coordination, and processing when using his computer at home. Pt demonstrated difficulty w/ initial understanding of task, but improved w/ each repetition. OT provided assist when 2 letters appeared simultaneously to allow for continued focus on 1 letter at a time. OT reviewed education on options for continued practice at home introduced in previous session.   10/09/21 Simulated LB dressing with gait belt.  OT demonstrating technique with use of reacher. Pt stating that he has a reacher but does not typically use it as he feels that he can reach towards his lower legs.  Pt demonstrating ability to don simulated pants with gait belt with close supervision when standing to pull "pants" over hips.  Pt reports/demonstrates that he is able to do it but prefers when he has assistance as it can be "laborious".   Donning shoes. Pt demonstrating ability to untie and doff shoe with increased effort to reach forward.  Pt utilizing long handled shoe horn to attempt to don shoe.  OT providing initial verbal cue and setup assist for placement of shoe horn to increase success when donning shoe.  Pt demonstrating increased time to tie shoes, due to decreased coordination, but able to complete.  OT demonstrated use of shoe funnel, however would not work with style of shoe pt wearing today.   Typing/Mouse accuracy: OT demonstrating mouse accuracy program and discussing variety of typing programs available online.  OT printed these (see pt instructions) to allow for pt to practice as he continues to report getting "locked out" of websites due to mistyping passwords.   Simulated shower transfers:  sidestepping R and L at counter top with CGA for standing balance.  Pt continues to demonstrate decreased clearance of B feet with sidestepping requiring increased time and effort, still ultimately not clearing enough to step over 3" threshold as needed to access shower this session.    10/04/21 Handwriting: practiced handwriting on standard lined paper w/ good legibility and slightly decr speed and smoothness of lines; OT incorporated history trivia questions to also address attention/cognition and spelling. Pt able to correctly answer questions and spell familiar words w/ min difficulty; required verbal cues (help sounding out words) for spelling about 50% of the time, though some words were inherently difficult (e.g., protestant, [place]). Letter formation, spacing, sizing were all WNL. No letter reversals noted today. FMC/Coordination: picking up 3 marbles one at a time, translating each to fingertips, and placing on easy-grip pegs to facilitate in-hand manipulation, control, and intrinsic hand strengthening. Completed 20 marbles w/ multiple drops; increased success with decreased speed; used same sequencing/skill to return marbles to container. OT instructed pt to maintain finger adduction as  able during in-hand manipulation to prevent marbles from slipping between digits. Self-care/ADLs: education provided/demonstrated on potential benefit of long-handle shoe horn; will trial next session. Also discussed current strategies for shower transfers (pt has walk-in shower); may benefit from practicing simulated transfer to increase safety and develop compensatory/adaptive strategies prn   PATIENT EDUCATION: Ongoing condition-specific education related to therapeutic interventions completed this session Person educated: Patient Education method: Explanation and Demonstration Education comprehension: verbalized understanding   HOME EXERCISE PROGRAM: Access Code: [email or web address] URL:  [email or web address] Date: 09/11/2021 Prepared by: [facility]  Exercises - Putty Squeezes  - 1 x daily - 1 sets - 10 reps - Rolling Putty on Table  - 1 x daily - 1 sets - 10 reps - Thumb Opposition with Putty  - 1 x daily - 1 sets - 10 reps - Key Pinch with Putty  - 1 x daily - 1 sets - 10 reps - Tip Pinch with Putty  - 1 x daily - 1 sets - 10 reps - Removing Marbles from Putty  - 1 x daily - 1 sets - 10 reps  GOALS: Goals reviewed with patient? Yes  SHORT TERM GOALS: Target date: 09/29/2021    Status:  1 Pt will verbalize understanding of adapted strategies and/or potential AE needs to increase ease, safety, and independence w/ LB dressing. Baseline: Mod A-Dep w/ LB dressing tasks Status: Pt reports still having difficulty with getting pants up/down and on/off. Will benefit from simulated practice but have not to this point due to focus on [facility] and handwriting NOT MET - 09/27/21  2 Pt will be independent with FMC/GMC and strengthening HEP to increase independence with handwriting and cutting of foods Baseline: to be administered Met - 09/27/21  3 Pt will demonstrate improved fine motor coordination for ADLs as evidenced by decreasing 9 hole peg test score for RUE by 5 secs Baseline: Right: 1:06.53 sec; Left: 34.53 sec Met - 56.15 sec    LONG TERM GOALS: Target date: 10/27/2021    Status:  1 Pt will report ability to complete LB dressing with supervision for standing balance and use of AE as needed Baseline: requires assistance Met - 10/04/21  2 Pt will demonstrated improved standing balance to stand for 1 min with alternating UE to allow for increased independence with LB dressing/clothing management post toileting Baseline:  Progressing  3 Pt will demonstrated improved working memory and recall during check writing/balancing check book by making 0 errors Baseline:  Progressing  4 Pt will demonstrate improved fine motor coordination  for ADLs as evidenced by decreasing 9 hole peg test score for RUE by 20 secs Baseline: Right: 1:06.53 sec; Left: 34.53 sec Upgraded 09/20/21 due to completing in 56.15 sec Met - 10/09/21:  43.91 sec  5 Pt will write a short paragraph with 90% legibility and no significant errors in spelling as evidenced by </= to 3 spelling errors Baseline: to be assessed Update: partly met as Letter formation, spacing, sizing were all WNL, still making spelling errors and requiring cues 50% of time for spelling Progressing    ASSESSMENT:  CLINICAL IMPRESSION: Pt's wife requested to attend session today, wanting to discuss what pt's POC will look like after initial certification period ends in a few weeks. OT facilitated discussion regarding current level of participation in functional activities and progress toward goals. Pt is currently still requiring assist for all IADLs, as well as functional mobility/transfers and LB dressing. OT reviewed previously discussed/practiced  strategies related to these activities w/ pt and his wife before pt's wife left and OT turned attention to typing practice for remainder of session as time allowed. Pt motivation is also a limiting factor to success w/ carryover to home. Will continue to focus on increasing participation in functional tasks for increased independence and safety and decreased caregiver strain.  PERFORMANCE DEFICITS in functional skills including ADLs, IADLs, coordination, sensation, ROM, strength, pain, FMC, GMC, balance, body mechanics, endurance, decreased knowledge of use of DME, and UE functional use, cognitive skills including attention, problem solving, sequencing, and thought, and psychosocial skills including coping strategies, environmental adaptation, and routines and behaviors.   IMPAIRMENTS are limiting patient from ADLs and IADLs.   COMORBIDITIES may have co-morbidities  that affects occupational performance. Patient will benefit from skilled OT to  address above impairments and improve overall function.  PLAN: OT FREQUENCY: 2x/week  OT DURATION: 8 weeks  PLANNED INTERVENTIONS: self care/ADL training, therapeutic exercise, therapeutic activity, neuromuscular re-education, balance training, functional mobility training, moist heat, cryotherapy, patient/family education, cognitive remediation/compensation, psychosocial skills training, energy conservation, coping strategies training, and DME and/or AE instructions  RECOMMENDED OTHER SERVICES: N/A  CONSULTED AND AGREED WITH PLAN OF CARE: Patient  PLAN FOR NEXT SESSION: Between this session and next, pt instructed to practice 1 functional task (pt chose typing) and complete PT exercises before or after as able  Confirm pt is scheduled to return to SLP (was not on schedule as of 10/11/21, but does want to continue)  Continue practice/problem-solving getting up/down from toilet, shower transfers, other ADLs, ect; review coordination HEP prn and UB strengthening   Kathrine Cords, OTR/L 10/11/2021, 2:31 PM

## 2021-10-16 ENCOUNTER — Ambulatory Visit: Payer: Medicare Other | Admitting: Occupational Therapy

## 2021-10-16 ENCOUNTER — Ambulatory Visit: Payer: Medicare Other

## 2021-10-16 DIAGNOSIS — R2689 Other abnormalities of gait and mobility: Secondary | ICD-10-CM

## 2021-10-16 DIAGNOSIS — R2681 Unsteadiness on feet: Secondary | ICD-10-CM

## 2021-10-16 DIAGNOSIS — R262 Difficulty in walking, not elsewhere classified: Secondary | ICD-10-CM

## 2021-10-16 DIAGNOSIS — R82998 Other abnormal findings in urine: Secondary | ICD-10-CM | POA: Diagnosis not present

## 2021-10-16 DIAGNOSIS — M6281 Muscle weakness (generalized): Secondary | ICD-10-CM

## 2021-10-16 DIAGNOSIS — R41841 Cognitive communication deficit: Secondary | ICD-10-CM | POA: Diagnosis not present

## 2021-10-16 DIAGNOSIS — R208 Other disturbances of skin sensation: Secondary | ICD-10-CM

## 2021-10-16 DIAGNOSIS — R41840 Attention and concentration deficit: Secondary | ICD-10-CM

## 2021-10-16 DIAGNOSIS — I69351 Hemiplegia and hemiparesis following cerebral infarction affecting right dominant side: Secondary | ICD-10-CM

## 2021-10-16 DIAGNOSIS — I10 Essential (primary) hypertension: Secondary | ICD-10-CM | POA: Diagnosis not present

## 2021-10-16 DIAGNOSIS — R4701 Aphasia: Secondary | ICD-10-CM | POA: Diagnosis not present

## 2021-10-16 NOTE — Therapy (Signed)
OUTPATIENT PHYSICAL THERAPY NEURO TREATMENT   Patient Name: [name] [name] MRN: [id number] DOB:1926/12/02, 88 y.o., male Today's Date: 10/16/2021   PCP: [name], MD REFERRING PROVIDER: Charlett Blake, MD    PT End of Session - 10/16/21 1413     Visit Number 11    Number of Visits 16    Date for PT Re-Evaluation 11/07/21    Authorization Type BCBS Medicare    Authorization Time Period no VL, no auth    Progress Note Due on Visit 20    PT Start Time 1400    PT Stop Time 1445    PT Time Calculation (min) 45 min    Equipment Utilized During Treatment Gait belt    Activity Tolerance Patient tolerated treatment well;Patient limited by pain    Behavior During Therapy WFL for tasks assessed/performed                    Past Medical History:  Diagnosis Date   AAA (abdominal aortic aneurysm) ([place])    Arthritis    Ascending aorta dilatation ([place]) 03/13/2012   Fusiform dilatation of the ascending thoracic aorta discovered during surgery, max diameter 4.2-4.3 cm   BPH (benign prostatic hypertrophy)    Chronic systolic heart failure ([place]) 03/03/2012   Chronic venous insufficiency    GERD (gastroesophageal reflux disease)    Glaucoma    Gout    History of kidney stones    Hyperlipidemia    Hypertension    Obesity (BMI 30-39.9)    Paroxysmal atrial fibrillation ([place]) 02/29/2012   Recurrent paroxysmal, new-onset    PMR (polymyalgia rheumatica) (HCC)    Polymyalgia rheumatica ([place]) 02/06/2019   Pre-diabetes    S/P CABG x 3 03/06/2012   LIMA to LAD, SVG to OM, SVG to RCA, EVH via right thigh   S/P Maze operation for atrial fibrillation 03/06/2012   Complete bilateral lesions set using bipolar radiofrequency and cryothermy ablation with clipping of LA appendage   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   s/p TAVR with a 26 mm Edwards via the left subclavian by Drs Buena Irish and Bartle   Severe aortic stenosis 09/29/2019   Sleep apnea     USES CPAP NIGHTLY   Past Surgical History:  Procedure Laterality Date   ABDOMINAL AORTOGRAM W/LOWER EXTREMITY N/A 10/26/2020   Procedure: ABDOMINAL AORTOGRAM W/LOWER EXTREMITY;  Surgeon: Broadus John, MD;  Location: [facility];  Service: Cardiovascular;  Laterality: N/A;   CARDIAC CATHETERIZATION     CATARACT EXTRACTION, BILATERAL     with lens implants   CHOLECYSTECTOMY N/A 03/24/2012   Procedure: LAPAROSCOPIC CHOLECYSTECTOMY WITH INTRAOPERATIVE CHOLANGIOGRAM;  Surgeon: Zenovia Jarred, MD;  Location: [facility];  Service: General;  Laterality: N/A;   CORONARY ARTERY BYPASS GRAFT N/A 03/06/2012   Procedure: CORONARY ARTERY BYPASS GRAFTING (CABG);  Surgeon: Rexene Alberts, MD;  Location: [facility];  Service: Open Heart Surgery;  Laterality: N/A;   ENDOVEIN HARVEST OF GREATER SAPHENOUS VEIN Right 03/06/2012   Procedure: ENDOVEIN HARVEST OF GREATER SAPHENOUS VEIN;  Surgeon: Rexene Alberts, MD;  Location: [facility];  Service: Open Heart Surgery;  Laterality: Right;   INTRAOPERATIVE TRANSESOPHAGEAL ECHOCARDIOGRAM N/A 03/06/2012   Procedure: INTRAOPERATIVE TRANSESOPHAGEAL ECHOCARDIOGRAM;  Surgeon: Rexene Alberts, MD;  Location: [facility];  Service: Open Heart Surgery;  Laterality: N/A;   IR KYPHO LUMBAR INC FX REDUCE BONE BX UNI/BIL CANNULATION INC/IMAGING  02/23/2020   LEFT HEART CATH Right 03/02/2012   Procedure:  LEFT HEART CATH;  Surgeon: Sherren Mocha, MD;  Location: [facility];  Service: Cardiovascular;  Laterality: Right;   MAZE N/A 03/06/2012   Procedure: MAZE;  Surgeon: Rexene Alberts, MD;  Location: [facility];  Service: Open Heart Surgery;  Laterality: N/A;   PERIPHERAL VASCULAR INTERVENTION Right 10/26/2020   Procedure: PERIPHERAL VASCULAR INTERVENTION;  Surgeon: Broadus John, MD;  Location: [facility];  Service: Cardiovascular;  Laterality: Right;   PROSTATECTOMY     partial   RIGHT/LEFT HEART CATH AND CORONARY/GRAFT ANGIOGRAPHY N/A 09/29/2019   Procedure: RIGHT/LEFT HEART CATH AND  CORONARY/GRAFT ANGIOGRAPHY;  Surgeon: Adrian Prows, MD;  Location: [facility];  Service: Cardiovascular;  Laterality: N/A;   TEE WITHOUT CARDIOVERSION N/A 11/10/2019   Procedure: TRANSESOPHAGEAL ECHOCARDIOGRAM (TEE);  Surgeon: Burnell Blanks, MD;  Location: [facility];  Service: Open Heart Surgery;  Laterality: N/A;   TRANSCATHETER AORTIC VALVE REPLACEMENT, TRANSFEMORAL  11/10/2019   Patient Active Problem List   Diagnosis Date Noted   Left middle cerebral artery stroke ([place]) 05/31/2021   Acute CVA (cerebrovascular accident) ([place]) 05/25/2021   DNR (do not resuscitate) 05/25/2021   Osteoporosis 05/05/2021   Gouty arthritis 02/13/2021   Morbid obesity ([place]) 02/13/2021   Abdominal aortic aneurysm without rupture ([place]) 11/22/2020   Acute renal failure syndrome ([place]) 11/22/2020   Low back pain 11/22/2020   Skin tear of right upper arm without complication 64/68/0321   Pressure injury of skin 10/23/2020   Urinary tract infection with hematuria 10/22/2020   Encephalopathy 10/22/2020   AKI (acute kidney injury) ([place])    Overweight (BMI 25.0-29.9) 08/19/2020   Vertebral fracture, osteoporotic ([place]) 08/18/2020   T8 vertebral fracture ([place]) 08/17/2020   Thrombocytopenia ([place]) 08/17/2020   CKD (chronic kidney disease) stage 3, GFR 30-59 ml/min (HCC) 08/17/2020   Elevated troponin 08/17/2020   Pain due to onychomycosis of toenails of both feet 07/27/2020   Callus of heel 07/27/2020   Bifascicular block 11/11/2019   Acute on chronic diastolic heart failure ([place]) 11/10/2019   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   Severe aortic stenosis 09/29/2019   Aortic valve disorder 22/48/2500   Systolic heart failure ([place]) 03/11/2019   Thrombophilia ([place]) 02/26/2019   Muscle pain 02/11/2019   Weakness 02/06/2019   Polymyalgia rheumatica ([place]) 02/06/2019   Shoulder joint pain 02/02/2019   Anemia 01/26/2019   Malignant melanoma of skin ([place]) 01/26/2019   Pain in right  leg 01/26/2019   DDD (degenerative disc disease), cervical 05/28/2018   Encounter for general adult medical examination without abnormal findings 11/19/2017   Carotid artery occlusion 07/03/2017   Hearing loss 07/03/2017   Fall 06/19/2017   Injury of upper extremity 06/19/2017   Neck pain 06/19/2017   Headache 06/19/2017   Visual disturbance 06/19/2017   Ganglion of hand 05/08/2017   Dilatation of aorta ([place]) 02/14/2017   Plantar fascial fibromatosis 02/14/2017   Cardiac murmur, unspecified 02/27/2016   Benign neoplasm of colon 08/24/2015   Dementia ([place]) 08/24/2015   Influenza due to unidentified influenza virus with other respiratory manifestations 03/15/2015   Epistaxis 12/01/2014   OSA (obstructive sleep apnea) 11/18/2014   Gastro-esophageal reflux disease with esophagitis 10/29/2014   Angina pectoris ([place]) 05/04/2014   Abnormal feces 07/31/2013   Other specified disorders of gingiva and edentulous alveolar ridge 07/31/2013   Spontaneous ecchymosis 10/10/2012   Thoracic ascending aortic aneurysm ([place]) 09/29/2012   Microscopic hematuria 04/11/2012   Proteinuria 04/11/2012   S/P laparoscopic cholecystectomy 04/09/2012  S/P CABG x 3 03/06/2012   S/P Maze operation for atrial fibrillation 03/06/2012   Paroxysmal atrial fibrillation ([place]) 02/29/2012   Bitten or stung by nonvenomous insect and other nonvenomous arthropods, initial encounter 12/03/2011   Depression screening 10/17/2011   CAD (coronary artery disease), native coronary artery    Hyperlipidemia    Hypertension    Chronic venous insufficiency    Gout    Benign prostatic hyperplasia    Post-traumatic stress disorder 03/13/2011   Pain in left leg 12/08/2010   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   Fatigue 08/08/2010   Retention of urine 04/18/2010   Constipation 07/05/2009   Kidney stone 07/05/2009   ED (erectile dysfunction) of organic origin 12/14/2008   Impaired fasting glucose 12/14/2008   Vitamin B  deficiency 12/14/2008    ONSET DATE: 05/31/21  REFERRING DIAG: Y17.494 (ICD-10-CM) - Left middle cerebral artery stroke (HCC)   THERAPY DIAG:  Hemiplegia and hemiparesis following cerebral infarction affecting right dominant side (HCC)  Muscle weakness (generalized)  Unsteadiness on feet  Difficulty in walking, not elsewhere classified  Other abnormalities of gait and mobility  Rationale for Evaluation and Treatment Rehabilitation  SUBJECTIVE:                                                                                                                                                                                              SUBJECTIVE STATEMENT: Admits to not doing his HEP- still has the printout.   Pt accompanied by: self  PERTINENT HISTORY: medical history significant of AAA; CAD s/p CABG; chronic systolic CHF; afib s/p Maze, on Xarelto; HTN; HLD; PMR (currently on steroids); pre-diabetes; OSA on CPAP; PAD with lymphedema; and AS s/p TAVR   PAIN:  Are you having pain? Yes: NPRS scale: 5/10 Pain location: lumbar spine and thighs  Pain description: aching, sore, sharp Aggravating factors: standing/walking Relieving factors: recliner, hot pack, stretching  PRECAUTIONS: Fall, pressure sores bilateral calcaneus right > left  PATIENT GOALS be able to stand unsupported for a time to perform ADL, walking if possible  OBJECTIVE:    TODAY'S TREATMENT: 10/06/21 Activity Comments  Supine LE PRE 3# 2x10 -hip add isom -SAQ -hip flexion -hooklying clamshells double yellow -straight leg abd/add -SKTC and DKTC 2x60 sec                     TODAY'S TREATMENT: 10/11/21 Activity Comments  Hip abduction MMT (in sitting) L 2+, R 4-  5xSTS in II bars Pt pushing off armrests or II bars and PT providing min A as needed. 1 min 29 sec from w/c   Static standing  with B UE support on II bar  22 sec; limited by LBP/LE pain   Stand pivot w/c<>mat  CGA/supervision to mat, CGA-min  A to w/c; patient refused to try transfers with w/c positioned at 45 deg angle rather than w/c facing him which was significantly less safe  Sit>supine using stretch out strap on R LE Min A required  Supine LTR 20x, alt march in hooklying 2x20 Good tolerance          PATIENT EDUCATION: Education details: encouraged patient/wife [name] to reach out to PCP to help address pain levels during sessions and improve compliance with HEP; HEP update Person educated: Patient and Spouse Education method: Explanation, Demonstration, Tactile cues, Verbal cues, and Handouts Education comprehension: verbalized understanding and returned demonstration   HOME EXERCISE PROGRAM Last updated: 10/11/21 Access Code: [email or web address] URL: [email or web address] Date: 10/11/2021 Prepared by: [facility]  Exercises - Seated Heel Raise  - 1 x daily - 5 x weekly - 2 sets - 10 reps - Seated Toe Raise  - 1 x daily - 5 x weekly - 2 sets - 10 reps - Seated March  - 1 x daily - 5 x weekly - 2 sets - 10 reps - Seated Long Arc Quad  - 1 x daily - 5 x weekly - 2 sets - 10 reps - Seated Hip Adduction Squeeze with Ball  - 1 x daily - 5 x weekly - 2 sets - 10 reps - Row with Anchored Resistance on Swiss Ball  - 1 x daily - 5 x weekly - 2 sets - 10 reps - Seated Shoulder Extension and Scapular Retraction with Resistance on Swiss Ball  - 1 x daily - 5 x weekly - 2 sets - 10 reps - Supine Lower Trunk Rotation  - 1 x daily - 5 x weekly - 2 sets - 20 reps    Below measures were taken at time of initial evaluation unless otherwise specified:   DIAGNOSTIC FINDINGS: IMPRESSION: Small areas of acute infarction in the right occipital lobe and left frontal lobe over the convexity. Mild to moderate acute infarct left operculum. Possible cerebral emboli.   Atrophy and mild chronic microvascular ischemia  COGNITION: Overall cognitive status: Within functional limits for tasks  assessed Patient's historical recall is not very accurate, he reports his back injury presented x 9 months ago, whereas medical record notes encounter in 12/2019-01/2020 regarding L1 compression fx   SENSATION: Not tested  COORDINATION: Alt. Foot taps impaired due to dorsiflexion weakness  EDEMA:  Bilateral LE, wearing compression stockings. Uses lymphedema pump  MUSCLE TONE: DNT    POSTURE: rounded shoulders  LOWER EXTREMITY ROM:     Passive  Right Eval Left Eval  Hip flexion    Hip extension    Hip abduction    Hip adduction    Hip internal rotation    Hip external rotation    Knee flexion    Knee extension    Ankle dorsiflexion -10 -10  Ankle plantarflexion    Ankle inversion    Ankle eversion     (Blank rows = not tested)  LOWER EXTREMITY MMT:    MMT Right Eval Left Eval  Hip flexion 3 3  Hip extension    Hip abduction 2+ 2+  Hip adduction 3 3  Hip internal rotation    Hip external rotation    Knee flexion 3+ 3+  Knee extension 4- 4-  Ankle dorsiflexion 0 0  Ankle plantarflexion    Ankle inversion    Ankle eversion    (Blank rows = not tested)  BED MOBILITY:  Sit to supine Min A and Mod A Supine to sit Min A Pt notes his bed at home has assist rails  TRANSFERS: Assistive device utilized: Wheelchair (manual)  Sit to stand: CGA and Min A Stand to sit: CGA and Min A Chair to chair: CGA and Min A Floor:  DNT   W/C mobility Independent level surfaces  STAIRS:  DNT--pt has elevator to enter/exit home  GAIT: DNT due to safety concerns  FUNCTIONAL TESTs:  5 times sit to stand: unable, requires BUE and assist Static standing: dependent on BUE via RW (supported standing) Straight Leg Raise: 20+ degrees extensor lag  PATIENT SURVEYS:  FOTO to be assessed     PATIENT EDUCATION: Education details: assessment findings Person educated: Patient Education method: Explanation Education comprehension: verbalized understanding   HOME  EXERCISE PROGRAM: To be initiated, demonstration/rehearsal of hip add isometric    GOALS: Goals reviewed with patient? Yes  SHORT TERM GOALS: Target date: 09/26/2021  Patient will be independent in HEP to improve functional outcomes Baseline: Goal status: GOAL MET, 09/27/2021  2.  Patient to demonstrate sit to supine w/ supervision using adaptive equipment to reduce level of assist from caregiver Baseline: Min-mod A for BLE to clear EOB; min A with stretch out strap 10/11/21 Goal status: per report 09/27/2021-NOT MET    LONG TERM GOALS: Target date: 11/07/2021  Demonstrate improved hip abduction strength to 3+/5 to improve standing stability Baseline: 2+/5; 2+/5 L and 4- on R 10/11/21 Goal status: IN PROGRESS 10/11/21  2.  Manifest improved BLE strength as evidenced by ability to perform 5xSTS test in 30 sec using BUE Baseline: unable; 1 min 29 sec 10/11/21 Goal status: IN PROGRESS 10/11/21  3.  Demonstrate static standing balance of Fair (no UE support) x 15 sec to improve safety with ADL performance Baseline: unable, dependent on BUE (Fair-); unchanged 10/11/21 Goal status: IN PROGRESS 10/11/21  4.  Demonstrate transfers w/ set-up assist to increase independence and safety with mobility Baseline: CGA-min A; unchanged 10/11/21 Goal status: IN PROGRESS 10/11/21   ASSESSMENT:  CLINICAL IMPRESSION: Tolerated activities well without adverse effects w/ tactile cues/guidance for isolated movements but overall demo adequate control. Assist for flexion-bias stretching due to upper body weakness. Continued sessions to advance strength and mobility with transfers. Trials of foot-up brace to see if this helps foot clearance during pivoting  OBJECTIVE IMPAIRMENTS cardiopulmonary status limiting activity, decreased activity tolerance, decreased balance, decreased knowledge of use of DME, decreased mobility, difficulty walking, decreased ROM, decreased strength, impaired perceived  functional ability, and pain.   ACTIVITY LIMITATIONS lifting, bending, standing, squatting, transfers, bed mobility, toileting, dressing, and locomotion level  PARTICIPATION LIMITATIONS: meal prep, cleaning, community activity, yard work, and home mgmt  PERSONAL FACTORS Age, Time since onset of injury/illness/exacerbation, and 3+ comorbidities: see PMH  are also affecting patient's functional outcome.   REHAB POTENTIAL: Good  CLINICAL DECISION MAKING: Evolving/moderate complexity  EVALUATION COMPLEXITY: Moderate  PLAN: PT FREQUENCY: 2x/week  PT DURATION: 8 weeks  PLANNED INTERVENTIONS: Therapeutic exercises, Therapeutic activity, Neuromuscular re-education, Balance training, Gait training, Patient/Family education, Self Care, Joint mobilization, Stair training, Vestibular training, Canalith repositioning, Orthotic/Fit training, DME instructions, Aquatic Therapy, Dry Needling, Electrical stimulation, Wheelchair mobility training, Spinal mobilization, Cryotherapy, Moist heat, Taping, Ionotophoresis 68m/ml Dexamethasone, and Manual therapy  PLAN FOR NEXT SESSION: Transfers/static standing/gait(?) with bilateral platform RW, trial with foot-up  cuff?    2:42 PM, 10/16/21 M. Sherlyn Lees, PT, DPT Physical Therapist- [place] Office Number: [phone]   [facility] [address] [place], [place] [address] Phone # [phone] Fax # [phone]

## 2021-10-16 NOTE — Therapy (Signed)
OUTPATIENT OCCUPATIONAL THERAPY  TREATMENT NOTE  Patient Name: KENDARIOUS GUDINO MRN: [id number] DOB:1926-01-09, 88 y.o., male Today's Date: 10/16/2021  PCP: [name], MD REFERRING PROVIDER: Charlett Blake, MD    OT End of Session - 10/16/21 1500     Visit Number 12    Number of Visits 17    Date for OT Re-Evaluation 10/27/21    Authorization Type BCBS Medicare    OT Start Time 1319    OT Stop Time 1400    OT Time Calculation (min) 41 min    Activity Tolerance Patient tolerated treatment well    Behavior During Therapy WFL for tasks assessed/performed             Past Medical History:  Diagnosis Date   AAA (abdominal aortic aneurysm) ([place])    Arthritis    Ascending aorta dilatation ([place]) 03/13/2012   Fusiform dilatation of the ascending thoracic aorta discovered during surgery, max diameter 4.2-4.3 cm   BPH (benign prostatic hypertrophy)    Chronic systolic heart failure ([place]) 03/03/2012   Chronic venous insufficiency    GERD (gastroesophageal reflux disease)    Glaucoma    Gout    History of kidney stones    Hyperlipidemia    Hypertension    Obesity (BMI 30-39.9)    Paroxysmal atrial fibrillation ([place]) 02/29/2012   Recurrent paroxysmal, new-onset    PMR (polymyalgia rheumatica) (HCC)    Polymyalgia rheumatica ([place]) 02/06/2019   Pre-diabetes    S/P CABG x 3 03/06/2012   LIMA to LAD, SVG to OM, SVG to RCA, EVH via right thigh   S/P Maze operation for atrial fibrillation 03/06/2012   Complete bilateral lesions set using bipolar radiofrequency and cryothermy ablation with clipping of LA appendage   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   s/p TAVR with a 26 mm Edwards via the left subclavian by Drs Buena Irish and Bartle   Severe aortic stenosis 09/29/2019   Sleep apnea    USES CPAP NIGHTLY   Past Surgical History:  Procedure Laterality Date   ABDOMINAL AORTOGRAM W/LOWER EXTREMITY N/A 10/26/2020   Procedure: ABDOMINAL AORTOGRAM  W/LOWER EXTREMITY;  Surgeon: Broadus John, MD;  Location: [facility];  Service: Cardiovascular;  Laterality: N/A;   CARDIAC CATHETERIZATION     CATARACT EXTRACTION, BILATERAL     with lens implants   CHOLECYSTECTOMY N/A 03/24/2012   Procedure: LAPAROSCOPIC CHOLECYSTECTOMY WITH INTRAOPERATIVE CHOLANGIOGRAM;  Surgeon: Zenovia Jarred, MD;  Location: [facility];  Service: General;  Laterality: N/A;   CORONARY ARTERY BYPASS GRAFT N/A 03/06/2012   Procedure: CORONARY ARTERY BYPASS GRAFTING (CABG);  Surgeon: Rexene Alberts, MD;  Location: [facility];  Service: Open Heart Surgery;  Laterality: N/A;   ENDOVEIN HARVEST OF GREATER SAPHENOUS VEIN Right 03/06/2012   Procedure: ENDOVEIN HARVEST OF GREATER SAPHENOUS VEIN;  Surgeon: Rexene Alberts, MD;  Location: [facility];  Service: Open Heart Surgery;  Laterality: Right;   INTRAOPERATIVE TRANSESOPHAGEAL ECHOCARDIOGRAM N/A 03/06/2012   Procedure: INTRAOPERATIVE TRANSESOPHAGEAL ECHOCARDIOGRAM;  Surgeon: Rexene Alberts, MD;  Location: [facility];  Service: Open Heart Surgery;  Laterality: N/A;   IR KYPHO LUMBAR INC FX REDUCE BONE BX UNI/BIL CANNULATION INC/IMAGING  02/23/2020   LEFT HEART CATH Right 03/02/2012   Procedure: LEFT HEART CATH;  Surgeon: Sherren Mocha, MD;  Location: [facility];  Service: Cardiovascular;  Laterality: Right;   MAZE N/A 03/06/2012   Procedure: MAZE;  Surgeon: Rexene Alberts, MD;  Location: [facility];  Service: Open Heart Surgery;  Laterality: N/A;   PERIPHERAL VASCULAR INTERVENTION Right 10/26/2020   Procedure: PERIPHERAL VASCULAR INTERVENTION;  Surgeon: Broadus John, MD;  Location: [facility];  Service: Cardiovascular;  Laterality: Right;   PROSTATECTOMY     partial   RIGHT/LEFT HEART CATH AND CORONARY/GRAFT ANGIOGRAPHY N/A 09/29/2019   Procedure: RIGHT/LEFT HEART CATH AND CORONARY/GRAFT ANGIOGRAPHY;  Surgeon: Adrian Prows, MD;  Location: [facility];  Service: Cardiovascular;  Laterality: N/A;   TEE WITHOUT CARDIOVERSION N/A  11/10/2019   Procedure: TRANSESOPHAGEAL ECHOCARDIOGRAM (TEE);  Surgeon: Burnell Blanks, MD;  Location: [facility];  Service: Open Heart Surgery;  Laterality: N/A;   TRANSCATHETER AORTIC VALVE REPLACEMENT, TRANSFEMORAL  11/10/2019   Patient Active Problem List   Diagnosis Date Noted   Left middle cerebral artery stroke ([place]) 05/31/2021   Acute CVA (cerebrovascular accident) ([place]) 05/25/2021   DNR (do not resuscitate) 05/25/2021   Osteoporosis 05/05/2021   Gouty arthritis 02/13/2021   Morbid obesity ([place]) 02/13/2021   Abdominal aortic aneurysm without rupture ([place]) 11/22/2020   Acute renal failure syndrome ([place]) 11/22/2020   Low back pain 11/22/2020   Skin tear of right upper arm without complication 37/62/8315   Pressure injury of skin 10/23/2020   Urinary tract infection with hematuria 10/22/2020   Encephalopathy 10/22/2020   AKI (acute kidney injury) ([place])    Overweight (BMI 25.0-29.9) 08/19/2020   Vertebral fracture, osteoporotic ([place]) 08/18/2020   T8 vertebral fracture ([place]) 08/17/2020   Thrombocytopenia ([place]) 08/17/2020   CKD (chronic kidney disease) stage 3, GFR 30-59 ml/min (HCC) 08/17/2020   Elevated troponin 08/17/2020   Pain due to onychomycosis of toenails of both feet 07/27/2020   Callus of heel 07/27/2020   Bifascicular block 11/11/2019   Acute on chronic diastolic heart failure ([place]) 11/10/2019   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   Severe aortic stenosis 09/29/2019   Aortic valve disorder 17/61/6073   Systolic heart failure ([place]) 03/11/2019   Thrombophilia ([place]) 02/26/2019   Muscle pain 02/11/2019   Weakness 02/06/2019   Polymyalgia rheumatica ([place]) 02/06/2019   Shoulder joint pain 02/02/2019   Anemia 01/26/2019   Malignant melanoma of skin ([place]) 01/26/2019   Pain in right leg 01/26/2019   DDD (degenerative disc disease), cervical 05/28/2018   Encounter for general adult medical examination without abnormal findings 11/19/2017    Carotid artery occlusion 07/03/2017   Hearing loss 07/03/2017   Fall 06/19/2017   Injury of upper extremity 06/19/2017   Neck pain 06/19/2017   Headache 06/19/2017   Visual disturbance 06/19/2017   Ganglion of hand 05/08/2017   Dilatation of aorta ([place]) 02/14/2017   Plantar fascial fibromatosis 02/14/2017   Cardiac murmur, unspecified 02/27/2016   Benign neoplasm of colon 08/24/2015   Dementia ([place]) 08/24/2015   Influenza due to unidentified influenza virus with other respiratory manifestations 03/15/2015   Epistaxis 12/01/2014   OSA (obstructive sleep apnea) 11/18/2014   Gastro-esophageal reflux disease with esophagitis 10/29/2014   Angina pectoris ([place]) 05/04/2014   Abnormal feces 07/31/2013   Other specified disorders of gingiva and edentulous alveolar ridge 07/31/2013   Spontaneous ecchymosis 10/10/2012   Thoracic ascending aortic aneurysm ([place]) 09/29/2012   Microscopic hematuria 04/11/2012   Proteinuria 04/11/2012   S/P laparoscopic cholecystectomy 04/09/2012   S/P CABG x 3 03/06/2012   S/P Maze operation for atrial fibrillation 03/06/2012   Paroxysmal atrial fibrillation ([place]) 02/29/2012   Bitten or stung by nonvenomous insect and other nonvenomous arthropods, initial encounter 12/03/2011  Depression screening 10/17/2011   CAD (coronary artery disease), native coronary artery    Hyperlipidemia    Hypertension    Chronic venous insufficiency    Gout    Benign prostatic hyperplasia    Post-traumatic stress disorder 03/13/2011   Pain in left leg 12/08/2010   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   Fatigue 08/08/2010   Retention of urine 04/18/2010   Constipation 07/05/2009   Kidney stone 07/05/2009   ED (erectile dysfunction) of organic origin 12/14/2008   Impaired fasting glucose 12/14/2008   Vitamin B deficiency 12/14/2008    ONSET DATE: 05/25/2021  REFERRING DIAG: O03.559 (ICD-10-CM) - Left middle cerebral artery stroke   THERAPY DIAG:  Hemiplegia and  hemiparesis following cerebral infarction affecting right dominant side (HCC)  Muscle weakness (generalized)  Unsteadiness on feet  Attention and concentration deficit  Other disturbances of skin sensation  Rationale for Evaluation and Treatment Rehabilitation  SUBJECTIVE:   SUBJECTIVE STATEMENT: Pt reports he is experiencing a lot more back pain and is wondering if it could be due to UTI (awaiting results).  Pt accompanied by: self  PERTINENT HISTORY:  H/o AAA, CAD with CABG, compression fractures, chronic anemia followed by hematology services, chronic systolic congestive heart failure, A-fib/AF status post MAZE on Xarelto, hypertension, hyperlipidemia, PMR maintained on steroids, prediabetes, early dementia maintained on Namenda, chronic lower extremity wounds with Unna boot OSA with CPAP, PAD with lymphedema status post balloon angioplasty of posterior tibial artery October 2022 recent UTI placed on antibiotic therapy.  He does have a personal care attendant Monday Wednesday Friday for 3 hours.  Modified independent transfers power wheelchair bound x3 to 4 months prior to that ambulating short distances with a rollator.  Present 05/25/2021 with right-sided weakness and aphasia of acute onset.  Cranial CT scan showed small left MCA territory cortical infarct within the mid to posterior left frontal lobe. CT angiogram head and neck no large vessel occlusion.   PRECAUTIONS: Fall - multiple falls within past 6 months; 1 on 10/05/21  PAIN Are you having pain? Yes: NPRS scale: 5/10 Pain location: back and legs Pain description: constant Aggravating factors: standing, certain movements/exercises Relieving factors: injections, laying/sitting in a comfortable position  LIVING ENVIRONMENT: Lives with: lives with their spouse Lives in: House/apartment Has following equipment at home: Environmental consultant - 2 wheeled, Environmental consultant - 4 wheeled, Wheelchair (power), Wheelchair (manual), shower chair, bed side  commode, Grab bars, and elevator to access home from outside, hand held shower head  PLOF:  Has a caregiver 3x/week that assists with ADLs, d/t spinal compression fx pt has depended more on w/c in 5-6 months  PATIENT GOALS: to talk properly; to write and to spell   OBJECTIVE:   HAND DOMINANCE: Right  ADLs: Dressing: Setup w/ UB dressing, wife assists w/ pants, socks, shoes Grooming: Mod I from seated position Eating: reports great difficulty using a knife to cut food, wife frequently cuts foods for him Toileting: Occ difficulty pulling pants back up after toileting, leans forward to complete hygiene Bathing: Supervision/setup  Tub Shower transfers: caregiver assist w/ transfers in/out of shower providing SPV and occ Min A Equipment: Shower seat with back, Grab bars, Walk in shower, bed side commode, Reacher, and Sock aid  IADLs: Financial management: Reports difficulty with writing a check, math, spelling, but did most of financial management Handwriting: 50% legible and difficulty with spelling that has gotten worse since CVA  MOBILITY STATUS: Needs Assist: Mod I stand pivot from w/c, Supervision for shower transfers  FUNCTIONAL OUTCOME  MEASURES: FOTO: 58 on 09/27/21  -------------------------------------------------------------------------------------------------------------------------------------------------------- (objective measures above completed at initial evaluation unless otherwise dated)  TODAY'S TREATMENT:  10/16/21 Typing: OT used online [employer] game, transitioning to [email or web address] common words as pt demonstrating decreased speed as required for Keyboard Ninja activity.  Pt reports that he has difficulty finding the correct letter on the keyboard, however OT also discussed aphasia impacting recognition of correct letter when typing.  Pt completing 8 WPM with 98% accuracy (pt types with just index fingers on both hands - therefore slowing him down).  Pt completed  additional attempt  with 8 WPM and 100% accuracy. Pt with adequate accuracy due to words written on screen as he typed them, therefore transitioned to typing spoken words.  Pt demonstrating increased challenge with production of words/correct order of letters.  OT encouraged pt to engage in typing words or even sentences/phrases to challenge production of words and correct spelling with spontaneous writing as pt reports decreased accuracy with texting as well.     10/11/21 Patient/Family Education: OT discussed participation in ADLs, w/ pt and his spouse both reporting that pt is not very engaged w/ functional tasks at home, as well as progress toward goals and current plan of care in OT. OT provided education regarding approaches to therapy across phases of recovery, goals of OP occupational therapy, and importance of carryover of learned and practiced strategies from all disciplines to maximize benefit of therapy and overall recovery. OT encouraged pt to try compensatory strategies for ADLs first (e.g., using a reacher to don/doff pants), before asking for help w/ pt verbalizing understanding. Questions from pt and his wife were answered and both are agreeable to continuing w/ POC at this time. Typing: OT used online Keyboard Ninja typing game, focusing on single letters to address accuracy, coordination, and processing when using his computer at home. Pt demonstrated difficulty w/ initial understanding of task, but improved w/ each repetition. OT provided assist when 2 letters appeared simultaneously to allow for continued focus on 1 letter at a time. OT reviewed education on options for continued practice at home introduced in previous session.   10/09/21 Simulated LB dressing with gait belt.  OT demonstrating technique with use of reacher. Pt stating that he has a reacher but does not typically use it as he feels that he can reach towards his lower legs.  Pt demonstrating ability to don simulated pants  with gait belt with close supervision when standing to pull "pants" over hips.  Pt reports/demonstrates that he is able to do it but prefers when he has assistance as it can be "laborious".   Donning shoes. Pt demonstrating ability to untie and doff shoe with increased effort to reach forward.  Pt utilizing long handled shoe horn to attempt to don shoe.  OT providing initial verbal cue and setup assist for placement of shoe horn to increase success when donning shoe.  Pt demonstrating increased time to tie shoes, due to decreased coordination, but able to complete.  OT demonstrated use of shoe funnel, however would not work with style of shoe pt wearing today.   Typing/Mouse accuracy: OT demonstrating mouse accuracy program and discussing variety of typing programs available online.  OT printed these (see pt instructions) to allow for pt to practice as he continues to report getting "locked out" of websites due to mistyping passwords.   Simulated shower transfers: sidestepping R and L at counter top with CGA for standing balance.  Pt continues to demonstrate decreased clearance of  B feet with sidestepping requiring increased time and effort, still ultimately not clearing enough to step over 3" threshold as needed to access shower this session.     PATIENT EDUCATION: Ongoing condition-specific education related to therapeutic interventions completed this session Person educated: Patient Education method: Explanation and Demonstration Education comprehension: verbalized understanding   HOME EXERCISE PROGRAM: Access Code: [email or web address] URL: [email or web address] Date: 09/11/2021 Prepared by: [facility]  Exercises - Putty Squeezes  - 1 x daily - 1 sets - 10 reps - Rolling Putty on Table  - 1 x daily - 1 sets - 10 reps - Thumb Opposition with Putty  - 1 x daily - 1 sets - 10 reps - Key Pinch with Putty  - 1 x daily - 1 sets - 10 reps - Tip Pinch with  Putty  - 1 x daily - 1 sets - 10 reps - Removing Marbles from Putty  - 1 x daily - 1 sets - 10 reps  GOALS: Goals reviewed with patient? Yes  SHORT TERM GOALS: Target date: 09/29/2021    Status:  1 Pt will verbalize understanding of adapted strategies and/or potential AE needs to increase ease, safety, and independence w/ LB dressing. Baseline: Mod A-Dep w/ LB dressing tasks Status: Pt reports still having difficulty with getting pants up/down and on/off. Will benefit from simulated practice but have not to this point due to focus on [facility] and handwriting NOT MET - 09/27/21  2 Pt will be independent with FMC/GMC and strengthening HEP to increase independence with handwriting and cutting of foods Baseline: to be administered Met - 09/27/21  3 Pt will demonstrate improved fine motor coordination for ADLs as evidenced by decreasing 9 hole peg test score for RUE by 5 secs Baseline: Right: 1:06.53 sec; Left: 34.53 sec Met - 56.15 sec    LONG TERM GOALS: Target date: 10/27/2021    Status:  1 Pt will report ability to complete LB dressing with supervision for standing balance and use of AE as needed Baseline: requires assistance Met - 10/04/21  2 Pt will demonstrated improved standing balance to stand for 1 min with alternating UE to allow for increased independence with LB dressing/clothing management post toileting Baseline:  Progressing  3 Pt will demonstrated improved working memory and recall during check writing/balancing check book by making 0 errors Baseline:  Progressing  4 Pt will demonstrate improved fine motor coordination for ADLs as evidenced by decreasing 9 hole peg test score for RUE by 20 secs Baseline: Right: 1:06.53 sec; Left: 34.53 sec Upgraded 09/20/21 due to completing in 56.15 sec Met - 10/09/21:  43.91 sec  5 Pt will write a short paragraph with 90% legibility and no significant errors in spelling as evidenced by </= to 3 spelling errors Baseline: to be assessed Update:  partly met as Letter formation, spacing, sizing were all WNL, still making spelling errors and requiring cues 50% of time for spelling Progressing    ASSESSMENT:  CLINICAL IMPRESSION: Treatment session with focus on typing and word production/spelling.  OT reviewed previously discussed impact of aphasia on his word production/spelling. Pt unable to recall discussion about engaging in PT exercises and functional tasks. Pt motivation continues to be a limiting factor to success with carryover to home.   PERFORMANCE DEFICITS in functional skills including ADLs, IADLs, coordination, sensation, ROM, strength, pain, FMC, GMC, balance, body mechanics, endurance, decreased knowledge of use of DME, and UE functional use, cognitive skills including attention,  problem solving, sequencing, and thought, and psychosocial skills including coping strategies, environmental adaptation, and routines and behaviors.   IMPAIRMENTS are limiting patient from ADLs and IADLs.   COMORBIDITIES may have co-morbidities  that affects occupational performance. Patient will benefit from skilled OT to address above impairments and improve overall function.  PLAN: OT FREQUENCY: 2x/week  OT DURATION: 8 weeks  PLANNED INTERVENTIONS: self care/ADL training, therapeutic exercise, therapeutic activity, neuromuscular re-education, balance training, functional mobility training, moist heat, cryotherapy, patient/family education, cognitive remediation/compensation, psychosocial skills training, energy conservation, coping strategies training, and DME and/or AE instructions  RECOMMENDED OTHER SERVICES: N/A  CONSULTED AND AGREED WITH PLAN OF CARE: Patient  PLAN FOR NEXT SESSION: Between this session and next, pt instructed to practice 1 functional task (pt chose typing) and complete PT exercises before or after as able  Continue practice/problem-solving getting up/down from toilet, shower transfers, other ADLs, etc; review  coordination HEP prn and UB strengthening   Annisten [place], [place], OTR/L 10/16/2021, 3:01 PM

## 2021-10-17 NOTE — Therapy (Signed)
OUTPATIENT PHYSICAL THERAPY NEURO TREATMENT   Patient Name: [name] [name] MRN: [id number] DOB:[birth year], 88 y.o., male Today's Date: 10/18/2021   PCP: [name], MD REFERRING PROVIDER: Charlett Blake, MD    PT End of Session - 10/18/21 1441     Visit Number 12    Number of Visits 16    Date for PT Re-Evaluation 11/07/21    Authorization Type BCBS Medicare    Authorization Time Period no VL, no auth    Progress Note Due on Visit 20    PT Start Time 1402    PT Stop Time 4158    PT Time Calculation (min) 40 min    Equipment Utilized During Treatment Gait belt    Activity Tolerance Patient tolerated treatment well;Patient limited by pain    Behavior During Therapy WFL for tasks assessed/performed                     Past Medical History:  Diagnosis Date   AAA (abdominal aortic aneurysm) ([place])    Arthritis    Ascending aorta dilatation ([place]) 03/13/2012   Fusiform dilatation of the ascending thoracic aorta discovered during surgery, max diameter 4.2-4.3 cm   BPH (benign prostatic hypertrophy)    Chronic systolic heart failure ([place]) 03/03/2012   Chronic venous insufficiency    GERD (gastroesophageal reflux disease)    Glaucoma    Gout    History of kidney stones    Hyperlipidemia    Hypertension    Obesity (BMI 30-39.9)    Paroxysmal atrial fibrillation ([place]) 02/29/2012   Recurrent paroxysmal, new-onset    PMR (polymyalgia rheumatica) (HCC)    Polymyalgia rheumatica ([place]) 02/06/2019   Pre-diabetes    S/P CABG x 3 03/06/2012   LIMA to LAD, SVG to OM, SVG to RCA, EVH via right thigh   S/P Maze operation for atrial fibrillation 03/06/2012   Complete bilateral lesions set using bipolar radiofrequency and cryothermy ablation with clipping of LA appendage   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   s/p TAVR with a 26 mm Edwards via the left subclavian by Drs Buena Irish and Bartle   Severe aortic stenosis 09/29/2019   Sleep apnea     USES CPAP NIGHTLY   Past Surgical History:  Procedure Laterality Date   ABDOMINAL AORTOGRAM W/LOWER EXTREMITY N/A 10/26/2020   Procedure: ABDOMINAL AORTOGRAM W/LOWER EXTREMITY;  Surgeon: Broadus John, MD;  Location: [facility];  Service: Cardiovascular;  Laterality: N/A;   CARDIAC CATHETERIZATION     CATARACT EXTRACTION, BILATERAL     with lens implants   CHOLECYSTECTOMY N/A 03/24/2012   Procedure: LAPAROSCOPIC CHOLECYSTECTOMY WITH INTRAOPERATIVE CHOLANGIOGRAM;  Surgeon: Zenovia Jarred, MD;  Location: [facility];  Service: General;  Laterality: N/A;   CORONARY ARTERY BYPASS GRAFT N/A 03/06/2012   Procedure: CORONARY ARTERY BYPASS GRAFTING (CABG);  Surgeon: Rexene Alberts, MD;  Location: [facility];  Service: Open Heart Surgery;  Laterality: N/A;   ENDOVEIN HARVEST OF GREATER SAPHENOUS VEIN Right 03/06/2012   Procedure: ENDOVEIN HARVEST OF GREATER SAPHENOUS VEIN;  Surgeon: Rexene Alberts, MD;  Location: [facility];  Service: Open Heart Surgery;  Laterality: Right;   INTRAOPERATIVE TRANSESOPHAGEAL ECHOCARDIOGRAM N/A 03/06/2012   Procedure: INTRAOPERATIVE TRANSESOPHAGEAL ECHOCARDIOGRAM;  Surgeon: Rexene Alberts, MD;  Location: [facility];  Service: Open Heart Surgery;  Laterality: N/A;   IR KYPHO LUMBAR INC FX REDUCE BONE BX UNI/BIL CANNULATION INC/IMAGING  02/23/2020   LEFT HEART CATH Right 03/02/2012  Procedure: LEFT HEART CATH;  Surgeon: Sherren Mocha, MD;  Location: [facility];  Service: Cardiovascular;  Laterality: Right;   MAZE N/A 03/06/2012   Procedure: MAZE;  Surgeon: Rexene Alberts, MD;  Location: [facility];  Service: Open Heart Surgery;  Laterality: N/A;   PERIPHERAL VASCULAR INTERVENTION Right 10/26/2020   Procedure: PERIPHERAL VASCULAR INTERVENTION;  Surgeon: Broadus John, MD;  Location: [facility];  Service: Cardiovascular;  Laterality: Right;   PROSTATECTOMY     partial   RIGHT/LEFT HEART CATH AND CORONARY/GRAFT ANGIOGRAPHY N/A 09/29/2019   Procedure: RIGHT/LEFT HEART CATH AND  CORONARY/GRAFT ANGIOGRAPHY;  Surgeon: Adrian Prows, MD;  Location: [facility];  Service: Cardiovascular;  Laterality: N/A;   TEE WITHOUT CARDIOVERSION N/A 11/10/2019   Procedure: TRANSESOPHAGEAL ECHOCARDIOGRAM (TEE);  Surgeon: Burnell Blanks, MD;  Location: [place] [facility];  Service: Open Heart Surgery;  Laterality: N/A;   TRANSCATHETER AORTIC VALVE REPLACEMENT, TRANSFEMORAL  11/10/2019   Patient Active Problem List   Diagnosis Date Noted   Left middle cerebral artery stroke ([place]) 05/31/2021   Acute CVA (cerebrovascular accident) ([place]) 05/25/2021   DNR (do not resuscitate) 05/25/2021   Osteoporosis 05/05/2021   Gouty arthritis 02/13/2021   Morbid obesity ([place]) 02/13/2021   Abdominal aortic aneurysm without rupture ([place]) 11/22/2020   Acute renal failure syndrome ([place]) 11/22/2020   Low back pain 11/22/2020   Skin tear of right upper arm without complication 78/58/8502   Pressure injury of skin 10/23/2020   Urinary tract infection with hematuria 10/22/2020   Encephalopathy 10/22/2020   AKI (acute kidney injury) ([place])    Overweight (BMI 25.0-29.9) 08/19/2020   Vertebral fracture, osteoporotic ([place]) 08/18/2020   T8 vertebral fracture ([place]) 08/17/2020   Thrombocytopenia ([place]) 08/17/2020   CKD (chronic kidney disease) stage 3, GFR 30-59 ml/min (HCC) 08/17/2020   Elevated troponin 08/17/2020   Pain due to onychomycosis of toenails of both feet 07/27/2020   Callus of heel 07/27/2020   Bifascicular block 11/11/2019   Acute on chronic diastolic heart failure ([place]) 11/10/2019   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   Severe aortic stenosis 09/29/2019   Aortic valve disorder 77/41/2878   Systolic heart failure ([place]) 03/11/2019   Thrombophilia ([place]) 02/26/2019   Muscle pain 02/11/2019   Weakness 02/06/2019   Polymyalgia rheumatica ([place]) 02/06/2019   Shoulder joint pain 02/02/2019   Anemia 01/26/2019   Malignant melanoma of skin ([place]) 01/26/2019   Pain in right  leg 01/26/2019   DDD (degenerative disc disease), cervical 05/28/2018   Encounter for general adult medical examination without abnormal findings 11/19/2017   Carotid artery occlusion 07/03/2017   Hearing loss 07/03/2017   Fall 06/19/2017   Injury of upper extremity 06/19/2017   Neck pain 06/19/2017   Headache 06/19/2017   Visual disturbance 06/19/2017   Ganglion of hand 05/08/2017   Dilatation of aorta ([place]) 02/14/2017   Plantar fascial fibromatosis 02/14/2017   Cardiac murmur, unspecified 02/27/2016   Benign neoplasm of colon 08/24/2015   Dementia ([place]) 08/24/2015   Influenza due to unidentified influenza virus with other respiratory manifestations 03/15/2015   Epistaxis 12/01/2014   OSA (obstructive sleep apnea) 11/18/2014   Gastro-esophageal reflux disease with esophagitis 10/29/2014   Angina pectoris ([place]) 05/04/2014   Abnormal feces 07/31/2013   Other specified disorders of gingiva and edentulous alveolar ridge 07/31/2013   Spontaneous ecchymosis 10/10/2012   Thoracic ascending aortic aneurysm ([place]) 09/29/2012   Microscopic hematuria 04/11/2012   Proteinuria 04/11/2012   S/P laparoscopic cholecystectomy 04/09/2012  S/P CABG x 3 03/06/2012   S/P Maze operation for atrial fibrillation 03/06/2012   Paroxysmal atrial fibrillation ([place]) 02/29/2012   Bitten or stung by nonvenomous insect and other nonvenomous arthropods, initial encounter 12/03/2011   Depression screening 10/17/2011   CAD (coronary artery disease), native coronary artery    Hyperlipidemia    Hypertension    Chronic venous insufficiency    Gout    Benign prostatic hyperplasia    Post-traumatic stress disorder 03/13/2011   Pain in left leg 12/08/2010   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   Fatigue 08/08/2010   Retention of urine 04/18/2010   Constipation 07/05/2009   Kidney stone 07/05/2009   ED (erectile dysfunction) of organic origin 12/14/2008   Impaired fasting glucose 12/14/2008   Vitamin B  deficiency 12/14/2008    ONSET DATE: 05/31/21  REFERRING DIAG: Y81.448 (ICD-10-CM) - Left middle cerebral artery stroke (HCC)   THERAPY DIAG:  Muscle weakness (generalized)  Unsteadiness on feet  Difficulty in walking, not elsewhere classified  Other abnormalities of gait and mobility  Rationale for Evaluation and Treatment Rehabilitation  SUBJECTIVE:                                                                                                                                                                                              SUBJECTIVE STATEMENT: Feels like he was doing better today until a couple of minutes ago. Cramping in the R calf.  Pt accompanied by: self  PERTINENT HISTORY: medical history significant of AAA; CAD s/p CABG; chronic systolic CHF; afib s/p Maze, on Xarelto; HTN; HLD; PMR (currently on steroids); pre-diabetes; OSA on CPAP; PAD with lymphedema; and AS s/p TAVR   PAIN:  Are you having pain? Yes: NPRS scale: 3-4/10 Pain location: R LE Pain description: aching, sore, sharp Aggravating factors: standing/walking Relieving factors: recliner, hot pack, stretching  PRECAUTIONS: Fall, pressure sores bilateral calcaneus right > left  PATIENT GOALS be able to stand unsupported for a time to perform ADL, walking if possible  OBJECTIVE:    TODAY'S TREATMENT: 10/18/21 Activity Comments  W/c >nustep and w/c<> mat transfer CGA-min A; pt still using unsafe transfer method  Nustep L3 x 7 min Ues/LEs Maintaining ~70 SPM  Stand pivot transfer with RW into w/c  Mod A and pt reporting LBP  Sitting R gastroc stretch with strap and elevated on stool 2x30"   STS from elevated mat at 21 inches with RW 2x3 reps  CGA; cueing for safe hand placement   Sitting trunk rotation 10x  Report of "relaxing"  Sitting there-ex with moist hot pack on LB ~ 10 min: March 2x20  R/L LAQ with 1.5 # 15x each LE fatigue with increased reps         PATIENT EDUCATION: Education  details: encouraged patient to reach out to MD as instructed a couple sessions ago and plan for DC at end of POC d/t poor PT tolerance  Person educated: Patient Education method: Explanation Education comprehension: verbalized understanding   HOME EXERCISE PROGRAM Last updated: 10/11/21 Access Code: [email or web address] URL: [email or web address] Date: 10/11/2021 Prepared by: [facility]  Exercises - Seated Heel Raise  - 1 x daily - 5 x weekly - 2 sets - 10 reps - Seated Toe Raise  - 1 x daily - 5 x weekly - 2 sets - 10 reps - Seated March  - 1 x daily - 5 x weekly - 2 sets - 10 reps - Seated Long Arc Quad  - 1 x daily - 5 x weekly - 2 sets - 10 reps - Seated Hip Adduction Squeeze with Ball  - 1 x daily - 5 x weekly - 2 sets - 10 reps - Row with Anchored Resistance on Swiss Ball  - 1 x daily - 5 x weekly - 2 sets - 10 reps - Seated Shoulder Extension and Scapular Retraction with Resistance on Swiss Ball  - 1 x daily - 5 x weekly - 2 sets - 10 reps - Supine Lower Trunk Rotation  - 1 x daily - 5 x weekly - 2 sets - 20 reps    Below measures were taken at time of initial evaluation unless otherwise specified:   DIAGNOSTIC FINDINGS: IMPRESSION: Small areas of acute infarction in the right occipital lobe and left frontal lobe over the convexity. Mild to moderate acute infarct left operculum. Possible cerebral emboli.   Atrophy and mild chronic microvascular ischemia  COGNITION: Overall cognitive status: Within functional limits for tasks assessed Patient's historical recall is not very accurate, he reports his back injury presented x 9 months ago, whereas medical record notes encounter in 12/2019-01/2020 regarding L1 compression fx   SENSATION: Not tested  COORDINATION: Alt. Foot taps impaired due to dorsiflexion weakness  EDEMA:  Bilateral LE, wearing compression stockings. Uses lymphedema pump  MUSCLE TONE: DNT    POSTURE: rounded  shoulders  LOWER EXTREMITY ROM:     Passive  Right Eval Left Eval  Hip flexion    Hip extension    Hip abduction    Hip adduction    Hip internal rotation    Hip external rotation    Knee flexion    Knee extension    Ankle dorsiflexion -10 -10  Ankle plantarflexion    Ankle inversion    Ankle eversion     (Blank rows = not tested)  LOWER EXTREMITY MMT:    MMT Right Eval Left Eval  Hip flexion 3 3  Hip extension    Hip abduction 2+ 2+  Hip adduction 3 3  Hip internal rotation    Hip external rotation    Knee flexion 3+ 3+  Knee extension 4- 4-  Ankle dorsiflexion 0 0  Ankle plantarflexion    Ankle inversion    Ankle eversion    (Blank rows = not tested)  BED MOBILITY:  Sit to supine Min A and Mod A Supine to sit Min A Pt notes his bed at home has assist rails  TRANSFERS: Assistive device utilized: Wheelchair (manual)  Sit to stand: CGA and Min A Stand to sit: CGA and Min A Chair  to chair: CGA and Min A Floor:  DNT   W/C mobility Independent level surfaces  STAIRS:  DNT--pt has elevator to enter/exit home  GAIT: DNT due to safety concerns  FUNCTIONAL TESTs:  5 times sit to stand: unable, requires BUE and assist Static standing: dependent on BUE via RW (supported standing) Straight Leg Raise: 20+ degrees extensor lag  PATIENT SURVEYS:  FOTO to be assessed     PATIENT EDUCATION: Education details: assessment findings Person educated: Patient Education method: Explanation Education comprehension: verbalized understanding   HOME EXERCISE PROGRAM: To be initiated, demonstration/rehearsal of hip add isometric    GOALS: Goals reviewed with patient? Yes  SHORT TERM GOALS: Target date: 09/26/2021  Patient will be independent in HEP to improve functional outcomes Baseline: Goal status: GOAL MET, 09/27/2021  2.  Patient to demonstrate sit to supine w/ supervision using adaptive equipment to reduce level of assist from caregiver Baseline:  Min-mod A for BLE to clear EOB; min A with stretch out strap 10/11/21 Goal status: per report 09/27/2021-NOT MET    LONG TERM GOALS: Target date: 11/07/2021  Demonstrate improved hip abduction strength to 3+/5 to improve standing stability Baseline: 2+/5; 2+/5 L and 4- on R 10/11/21 Goal status: IN PROGRESS 10/11/21  2.  Manifest improved BLE strength as evidenced by ability to perform 5xSTS test in 30 sec using BUE Baseline: unable; 1 min 29 sec 10/11/21 Goal status: IN PROGRESS 10/11/21  3.  Demonstrate static standing balance of Fair (no UE support) x 15 sec to improve safety with ADL performance Baseline: unable, dependent on BUE (Fair-); unchanged 10/11/21 Goal status: IN PROGRESS 10/11/21  4.  Demonstrate transfers w/ set-up assist to increase independence and safety with mobility Baseline: CGA-min A; unchanged 10/11/21 Goal status: IN PROGRESS 10/11/21   ASSESSMENT:  CLINICAL IMPRESSION: Patient arrived to session with report of R calf cramping which started only a couple minutes ago. Worked on progressive LE strengthening/aerobic conditioning which patient tolerated well and able to maintain good pace. Worked on calf stretching but patient reported no improvement- unable to visualize R calf d/t lymphedema wrapping on the LEs however no TTP evident. Worked on STS transfers with RW in front and from elevated mat height to improve success. Patient was limited in tolerance for standing time d/t LBP. Remainder of session was spent doing LE ROM in sitting with MHP d/t LBP. Patient tolerated session despite LBP; reiterated recommendation to speak to MD about pain management for improved PT tolerance.  OBJECTIVE IMPAIRMENTS cardiopulmonary status limiting activity, decreased activity tolerance, decreased balance, decreased knowledge of use of DME, decreased mobility, difficulty walking, decreased ROM, decreased strength, impaired perceived functional ability, and pain.   ACTIVITY  LIMITATIONS lifting, bending, standing, squatting, transfers, bed mobility, toileting, dressing, and locomotion level  PARTICIPATION LIMITATIONS: meal prep, cleaning, community activity, yard work, and home mgmt  PERSONAL FACTORS Age, Time since onset of injury/illness/exacerbation, and 3+ comorbidities: see PMH  are also affecting patient's functional outcome.   REHAB POTENTIAL: Good  CLINICAL DECISION MAKING: Evolving/moderate complexity  EVALUATION COMPLEXITY: Moderate  PLAN: PT FREQUENCY: 2x/week  PT DURATION: 8 weeks  PLANNED INTERVENTIONS: Therapeutic exercises, Therapeutic activity, Neuromuscular re-education, Balance training, Gait training, Patient/Family education, Self Care, Joint mobilization, Stair training, Vestibular training, Canalith repositioning, Orthotic/Fit training, DME instructions, Aquatic Therapy, Dry Needling, Electrical stimulation, Wheelchair mobility training, Spinal mobilization, Cryotherapy, Moist heat, Taping, Ionotophoresis 73m/ml Dexamethasone, and Manual therapy  PLAN FOR NEXT SESSION: Transfers/static standing/gait(?) with bilateral platform RW, trial with foot-up cuff?  Janene Harvey, PT, DPT 10/18/21 2:44 PM  [facility] at [facility] [address] [place], [place], [place] [address] Phone # [phone] Fax # [phone]

## 2021-10-18 ENCOUNTER — Ambulatory Visit: Payer: Medicare Other | Admitting: Physical Therapy

## 2021-10-18 ENCOUNTER — Ambulatory Visit: Payer: Medicare Other | Admitting: Occupational Therapy

## 2021-10-18 ENCOUNTER — Encounter: Payer: Self-pay | Admitting: Physical Therapy

## 2021-10-18 DIAGNOSIS — M6281 Muscle weakness (generalized): Secondary | ICD-10-CM

## 2021-10-18 DIAGNOSIS — R208 Other disturbances of skin sensation: Secondary | ICD-10-CM

## 2021-10-18 DIAGNOSIS — R2689 Other abnormalities of gait and mobility: Secondary | ICD-10-CM

## 2021-10-18 DIAGNOSIS — I69351 Hemiplegia and hemiparesis following cerebral infarction affecting right dominant side: Secondary | ICD-10-CM | POA: Diagnosis not present

## 2021-10-18 DIAGNOSIS — R4701 Aphasia: Secondary | ICD-10-CM | POA: Diagnosis not present

## 2021-10-18 DIAGNOSIS — R2681 Unsteadiness on feet: Secondary | ICD-10-CM | POA: Diagnosis not present

## 2021-10-18 DIAGNOSIS — R41840 Attention and concentration deficit: Secondary | ICD-10-CM | POA: Diagnosis not present

## 2021-10-18 DIAGNOSIS — R262 Difficulty in walking, not elsewhere classified: Secondary | ICD-10-CM

## 2021-10-18 DIAGNOSIS — R41841 Cognitive communication deficit: Secondary | ICD-10-CM | POA: Diagnosis not present

## 2021-10-18 NOTE — Therapy (Signed)
OUTPATIENT OCCUPATIONAL THERAPY  TREATMENT NOTE  Patient Name: [name] [name] MRN: [id number] DOB:[birth year], 88 y.o., male Today's Date: 10/18/2021  PCP: [name], MD REFERRING PROVIDER: Charlett Blake, MD    OT End of Session - 10/18/21 1322     Visit Number 13    Number of Visits 17    Date for OT Re-Evaluation 10/27/21    Authorization Type BCBS Medicare    OT Start Time 1318    OT Stop Time 1400    OT Time Calculation (min) 42 min    Activity Tolerance Patient tolerated treatment well    Behavior During Therapy WFL for tasks assessed/performed              Past Medical History:  Diagnosis Date   AAA (abdominal aortic aneurysm) ([place])    Arthritis    Ascending aorta dilatation ([place]) 03/13/2012   Fusiform dilatation of the ascending thoracic aorta discovered during surgery, max diameter 4.2-4.3 cm   BPH (benign prostatic hypertrophy)    Chronic systolic heart failure ([place]) 03/03/2012   Chronic venous insufficiency    GERD (gastroesophageal reflux disease)    Glaucoma    Gout    History of kidney stones    Hyperlipidemia    Hypertension    Obesity (BMI 30-39.9)    Paroxysmal atrial fibrillation ([place]) 02/29/2012   Recurrent paroxysmal, new-onset    PMR (polymyalgia rheumatica) (HCC)    Polymyalgia rheumatica ([place]) 02/06/2019   Pre-diabetes    S/P CABG x 3 03/06/2012   LIMA to LAD, SVG to OM, SVG to RCA, EVH via right thigh   S/P Maze operation for atrial fibrillation 03/06/2012   Complete bilateral lesions set using bipolar radiofrequency and cryothermy ablation with clipping of LA appendage   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   s/p TAVR with a 26 mm Edwards via the left subclavian by Drs Buena Irish and Bartle   Severe aortic stenosis 09/29/2019   Sleep apnea    USES CPAP NIGHTLY   Past Surgical History:  Procedure Laterality Date   ABDOMINAL AORTOGRAM W/LOWER EXTREMITY N/A 10/26/2020   Procedure: ABDOMINAL AORTOGRAM  W/LOWER EXTREMITY;  Surgeon: Broadus John, MD;  Location: [facility];  Service: Cardiovascular;  Laterality: N/A;   CARDIAC CATHETERIZATION     CATARACT EXTRACTION, BILATERAL     with lens implants   CHOLECYSTECTOMY N/A 03/24/2012   Procedure: LAPAROSCOPIC CHOLECYSTECTOMY WITH INTRAOPERATIVE CHOLANGIOGRAM;  Surgeon: Zenovia Jarred, MD;  Location: [facility];  Service: General;  Laterality: N/A;   CORONARY ARTERY BYPASS GRAFT N/A 03/06/2012   Procedure: CORONARY ARTERY BYPASS GRAFTING (CABG);  Surgeon: Rexene Alberts, MD;  Location: [facility];  Service: Open Heart Surgery;  Laterality: N/A;   ENDOVEIN HARVEST OF GREATER SAPHENOUS VEIN Right 03/06/2012   Procedure: ENDOVEIN HARVEST OF GREATER SAPHENOUS VEIN;  Surgeon: Rexene Alberts, MD;  Location: [facility];  Service: Open Heart Surgery;  Laterality: Right;   INTRAOPERATIVE TRANSESOPHAGEAL ECHOCARDIOGRAM N/A 03/06/2012   Procedure: INTRAOPERATIVE TRANSESOPHAGEAL ECHOCARDIOGRAM;  Surgeon: Rexene Alberts, MD;  Location: [facility];  Service: Open Heart Surgery;  Laterality: N/A;   IR KYPHO LUMBAR INC FX REDUCE BONE BX UNI/BIL CANNULATION INC/IMAGING  02/23/2020   LEFT HEART CATH Right 03/02/2012   Procedure: LEFT HEART CATH;  Surgeon: Sherren Mocha, MD;  Location: [facility];  Service: Cardiovascular;  Laterality: Right;   MAZE N/A 03/06/2012   Procedure: MAZE;  Surgeon: Rexene Alberts, MD;  Location: [facility];  Service: Open Heart Surgery;  Laterality: N/A;   PERIPHERAL VASCULAR INTERVENTION Right 10/26/2020   Procedure: PERIPHERAL VASCULAR INTERVENTION;  Surgeon: Broadus John, MD;  Location: [facility];  Service: Cardiovascular;  Laterality: Right;   PROSTATECTOMY     partial   RIGHT/LEFT HEART CATH AND CORONARY/GRAFT ANGIOGRAPHY N/A 09/29/2019   Procedure: RIGHT/LEFT HEART CATH AND CORONARY/GRAFT ANGIOGRAPHY;  Surgeon: Adrian Prows, MD;  Location: [facility];  Service: Cardiovascular;  Laterality: N/A;   TEE WITHOUT CARDIOVERSION N/A  11/10/2019   Procedure: TRANSESOPHAGEAL ECHOCARDIOGRAM (TEE);  Surgeon: Burnell Blanks, MD;  Location: [place];  Service: Open Heart Surgery;  Laterality: N/A;   TRANSCATHETER AORTIC VALVE REPLACEMENT, TRANSFEMORAL  11/10/2019   Patient Active Problem List   Diagnosis Date Noted   Left middle cerebral artery stroke ([place]) 05/31/2021   Acute CVA (cerebrovascular accident) ([place]) 05/25/2021   DNR (do not resuscitate) 05/25/2021   Osteoporosis 05/05/2021   Gouty arthritis 02/13/2021   Morbid obesity ([place]) 02/13/2021   Abdominal aortic aneurysm without rupture ([place]) 11/22/2020   Acute renal failure syndrome ([place]) 11/22/2020   Low back pain 11/22/2020   Skin tear of right upper arm without complication 97/58/8325   Pressure injury of skin 10/23/2020   Urinary tract infection with hematuria 10/22/2020   Encephalopathy 10/22/2020   AKI (acute kidney injury) ([place])    Overweight (BMI 25.0-29.9) 08/19/2020   Vertebral fracture, osteoporotic ([place]) 08/18/2020   T8 vertebral fracture ([place]) 08/17/2020   Thrombocytopenia ([place]) 08/17/2020   CKD (chronic kidney disease) stage 3, GFR 30-59 ml/min (HCC) 08/17/2020   Elevated troponin 08/17/2020   Pain due to onychomycosis of toenails of both feet 07/27/2020   Callus of heel 07/27/2020   Bifascicular block 11/11/2019   Acute on chronic diastolic heart failure ([place]) 11/10/2019   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   Severe aortic stenosis 09/29/2019   Aortic valve disorder 49/82/6415   Systolic heart failure ([place]) 03/11/2019   Thrombophilia ([place]) 02/26/2019   Muscle pain 02/11/2019   Weakness 02/06/2019   Polymyalgia rheumatica ([place]) 02/06/2019   Shoulder joint pain 02/02/2019   Anemia 01/26/2019   Malignant melanoma of skin ([place]) 01/26/2019   Pain in right leg 01/26/2019   DDD (degenerative disc disease), cervical 05/28/2018   Encounter for general adult medical examination without abnormal findings 11/19/2017    Carotid artery occlusion 07/03/2017   Hearing loss 07/03/2017   Fall 06/19/2017   Injury of upper extremity 06/19/2017   Neck pain 06/19/2017   Headache 06/19/2017   Visual disturbance 06/19/2017   Ganglion of hand 05/08/2017   Dilatation of aorta ([place]) 02/14/2017   Plantar fascial fibromatosis 02/14/2017   Cardiac murmur, unspecified 02/27/2016   Benign neoplasm of colon 08/24/2015   Dementia ([place]) 08/24/2015   Influenza due to unidentified influenza virus with other respiratory manifestations 03/15/2015   Epistaxis 12/01/2014   OSA (obstructive sleep apnea) 11/18/2014   Gastro-esophageal reflux disease with esophagitis 10/29/2014   Angina pectoris ([place]) 05/04/2014   Abnormal feces 07/31/2013   Other specified disorders of gingiva and edentulous alveolar ridge 07/31/2013   Spontaneous ecchymosis 10/10/2012   Thoracic ascending aortic aneurysm ([place]) 09/29/2012   Microscopic hematuria 04/11/2012   Proteinuria 04/11/2012   S/P laparoscopic cholecystectomy 04/09/2012   S/P CABG x 3 03/06/2012   S/P Maze operation for atrial fibrillation 03/06/2012   Paroxysmal atrial fibrillation ([place]) 02/29/2012   Bitten or stung by nonvenomous insect and other nonvenomous arthropods, initial encounter 12/03/2011  Depression screening 10/17/2011   CAD (coronary artery disease), native coronary artery    Hyperlipidemia    Hypertension    Chronic venous insufficiency    Gout    Benign prostatic hyperplasia    Post-traumatic stress disorder 03/13/2011   Pain in left leg 12/08/2010   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   Fatigue 08/08/2010   Retention of urine 04/18/2010   Constipation 07/05/2009   Kidney stone 07/05/2009   ED (erectile dysfunction) of organic origin 12/14/2008   Impaired fasting glucose 12/14/2008   Vitamin B deficiency 12/14/2008    ONSET DATE: 05/25/2021  REFERRING DIAG: U72.536 (ICD-10-CM) - Left middle cerebral artery stroke   THERAPY DIAG:  Hemiplegia and  hemiparesis following cerebral infarction affecting right dominant side (HCC)  Muscle weakness (generalized)  Unsteadiness on feet  Attention and concentration deficit  Other disturbances of skin sensation  Rationale for Evaluation and Treatment Rehabilitation  SUBJECTIVE:   SUBJECTIVE STATEMENT: Pt reports that he is using the TENS system and feels that his pain is significantly improving. Pt accompanied by: self  PERTINENT HISTORY:  H/o AAA, CAD with CABG, compression fractures, chronic anemia followed by hematology services, chronic systolic congestive heart failure, A-fib/AF status post MAZE on Xarelto, hypertension, hyperlipidemia, PMR maintained on steroids, prediabetes, early dementia maintained on Namenda, chronic lower extremity wounds with Unna boot OSA with CPAP, PAD with lymphedema status post balloon angioplasty of posterior tibial artery October 2022 recent UTI placed on antibiotic therapy.  He does have a personal care attendant Monday Wednesday Friday for 3 hours.  Modified independent transfers power wheelchair bound x3 to 4 months prior to that ambulating short distances with a rollator.  Present 05/25/2021 with right-sided weakness and aphasia of acute onset.  Cranial CT scan showed small left MCA territory cortical infarct within the mid to posterior left frontal lobe. CT angiogram head and neck no large vessel occlusion.   PRECAUTIONS: Fall - multiple falls within past 6 months; 1 on 10/05/21  PAIN Are you having pain? Yes: NPRS scale: 3/10 Pain location: back and legs Pain description: constant ache Aggravating factors: standing, certain movements/exercises Relieving factors: Tens unit, injections, laying/sitting in a comfortable position  LIVING ENVIRONMENT: Lives with: lives with their spouse Lives in: House/apartment Has following equipment at home: Environmental consultant - 2 wheeled, Environmental consultant - 4 wheeled, Wheelchair (power), Wheelchair (manual), shower chair, bed side commode,  Grab bars, and elevator to access home from outside, hand held shower head  PLOF:  Has a caregiver 3x/week that assists with ADLs, d/t spinal compression fx pt has depended more on w/c in 5-6 months  PATIENT GOALS: to talk properly; to write and to spell   OBJECTIVE:   HAND DOMINANCE: Right  ADLs: Dressing: Setup w/ UB dressing, wife assists w/ pants, socks, shoes Grooming: Mod I from seated position Eating: reports great difficulty using a knife to cut food, wife frequently cuts foods for him Toileting: Occ difficulty pulling pants back up after toileting, leans forward to complete hygiene Bathing: Supervision/setup  Tub Shower transfers: caregiver assist w/ transfers in/out of shower providing SPV and occ Min A Equipment: Shower seat with back, Grab bars, Walk in shower, bed side commode, Reacher, and Sock aid  IADLs: Financial management: Reports difficulty with writing a check, math, spelling, but did most of financial management Handwriting: 50% legible and difficulty with spelling that has gotten worse since CVA  MOBILITY STATUS: Needs Assist: Mod I stand pivot from w/c, Supervision for shower transfers  FUNCTIONAL OUTCOME MEASURES: FOTO: 58  on 09/27/21 FOTO Score: 69 on 10/18/21 -------------------------------------------------------------------------------------------------------------------------------------------------------- (objective measures above completed at initial evaluation unless otherwise dated)  TODAY'S TREATMENT:  10/18/21 FOTO: Improved score to 69 from 58.  Pt continues to report difficulty with picking up coins and putting dishes in a cabinet.   Shoulder flexion/reaching: utilized resistance Clothespins (1, 2, 4, 6#) with RUE for mod-high functional reaching and sustained pinch. Pt demonstrating good reaching technique and maintaining good positioning of RUE with reach. OT increased challenge to incorporating cognitive challenge with recall of colored  sequence.  Pt able to recall sequence without additional cues.   Handwriting: When writing about his desire to improve his typing he was able to come up with 3 sentences quickly.  Pt making 4 spelling errors when completing short paragraph witting activity and demonstrating ~85% legibility largely due to errors and crossing out to attempt to correct. Pt with significant increased difficulty when attempting to write about sailing (self-proclaimed leisure task) with decreased ideation, decreased legibility (50%) and increased spelling errors.  Encouraged pt to take his time to focus on not only legibility and spelling but word production (d/t aphasia).   Coordination: picking up and placing coins in to coin slot.  Pt initially sliding coins off table, however with cues to attempt to pick up pt able to pick up with min difficulty especially with smaller coins compared to larger.  Progressed to completing in-hand manipulation and translation.  Pt dropping 1/4 coins when translating from finger tips to palm and 0/4 when translating from palm to finger tips.  Pt able to complete with quarters and progressing to pennies with increased success.  Discussed visual attention to hand and task to compensate for impaired coordination and sensation.     10/16/21 Typing: OT used online [place] game, transitioning to [email or web address] common words as pt demonstrating decreased speed as required for Keyboard Ninja activity.  Pt reports that he has difficulty finding the correct letter on the keyboard, however OT also discussed aphasia impacting recognition of correct letter when typing.  Pt completing 8 WPM with 98% accuracy (pt types with just index fingers on both hands - therefore slowing him down).  Pt completed additional attempt  with 8 WPM and 100% accuracy. Pt with adequate accuracy due to words written on screen as he typed them, therefore transitioned to typing spoken words.  Pt demonstrating increased challenge with  production of words/correct order of letters.  OT encouraged pt to engage in typing words or even sentences/phrases to challenge production of words and correct spelling with spontaneous writing as pt reports decreased accuracy with texting as well.     10/11/21 Patient/Family Education: OT discussed participation in ADLs, w/ pt and his spouse both reporting that pt is not very engaged w/ functional tasks at home, as well as progress toward goals and current plan of care in OT. OT provided education regarding approaches to therapy across phases of recovery, goals of OP occupational therapy, and importance of carryover of learned and practiced strategies from all disciplines to maximize benefit of therapy and overall recovery. OT encouraged pt to try compensatory strategies for ADLs first (e.g., using a reacher to don/doff pants), before asking for help w/ pt verbalizing understanding. Questions from pt and his wife were answered and both are agreeable to continuing w/ POC at this time. Typing: OT used online Keyboard Ninja typing game, focusing on single letters to address accuracy, coordination, and processing when using his computer at home. Pt demonstrated difficulty w/ initial understanding  of task, but improved w/ each repetition. OT provided assist when 2 letters appeared simultaneously to allow for continued focus on 1 letter at a time. OT reviewed education on options for continued practice at home introduced in previous session.    PATIENT EDUCATION: Ongoing condition-specific education related to therapeutic interventions completed this session Person educated: Patient Education method: Explanation and Demonstration Education comprehension: verbalized understanding   HOME EXERCISE PROGRAM: Access Code: [email or web address] URL: [email or web address] Date: 09/11/2021 Prepared by: [facility]  Exercises - Putty Squeezes  - 1 x daily - 1 sets - 10  reps - Rolling Putty on Table  - 1 x daily - 1 sets - 10 reps - Thumb Opposition with Putty  - 1 x daily - 1 sets - 10 reps - Key Pinch with Putty  - 1 x daily - 1 sets - 10 reps - Tip Pinch with Putty  - 1 x daily - 1 sets - 10 reps - Removing Marbles from Putty  - 1 x daily - 1 sets - 10 reps  GOALS: Goals reviewed with patient? Yes  SHORT TERM GOALS: Target date: 09/29/2021    Status:  1 Pt will verbalize understanding of adapted strategies and/or potential AE needs to increase ease, safety, and independence w/ LB dressing. Baseline: Mod A-Dep w/ LB dressing tasks Status: Pt reports still having difficulty with getting pants up/down and on/off. Will benefit from simulated practice but have not to this point due to focus on [facility] and handwriting NOT MET - 09/27/21  2 Pt will be independent with FMC/GMC and strengthening HEP to increase independence with handwriting and cutting of foods Baseline: to be administered Met - 09/27/21  3 Pt will demonstrate improved fine motor coordination for ADLs as evidenced by decreasing 9 hole peg test score for RUE by 5 secs Baseline: Right: 1:06.53 sec; Left: 34.53 sec Met - 56.15 sec    LONG TERM GOALS: Target date: 10/27/2021    Status:  1 Pt will report ability to complete LB dressing with supervision for standing balance and use of AE as needed Baseline: requires assistance Met - 10/04/21  2 Pt will demonstrated improved standing balance to stand for 1 min with alternating UE to allow for increased independence with LB dressing/clothing management post toileting Baseline:  Progressing  3 Pt will demonstrated improved working memory and recall during check writing/balancing check book by making 0 errors Baseline:  Progressing  4 Pt will demonstrate improved fine motor coordination for ADLs as evidenced by decreasing 9 hole peg test score for RUE by 20 secs Baseline: Right: 1:06.53 sec; Left: 34.53 sec Upgraded 09/20/21 due to completing in 56.15 sec  Met - 10/09/21:  43.91 sec  5 Pt will write a short paragraph with 90% legibility and no significant errors in spelling as evidenced by </= to 3 spelling errors Baseline: to be assessed Update: partly met as Letter formation, spacing, sizing were all WNL, still making spelling errors and requiring cues 50% of time for spelling Progressing    ASSESSMENT:  CLINICAL IMPRESSION: Treatment session with focus on coordination, functional reaching, handwriting and word production/spelling.  OT reviewed previously discussed impact of aphasia on his word production/spelling.  Reiterated slowing down during word production to improve legibility of handwriting and spelling accuracy.  Pt demonstrating improvements when taking a sentence word by word with both improved legibility and spelling.  Discussed carryover into functional tasks as well to improve coordination, reach, and participation  in self-care tasks.  Pt motivation continues to be a limiting factor to success with carryover to home.   PERFORMANCE DEFICITS in functional skills including ADLs, IADLs, coordination, sensation, ROM, strength, pain, FMC, GMC, balance, body mechanics, endurance, decreased knowledge of use of DME, and UE functional use, cognitive skills including attention, problem solving, sequencing, and thought, and psychosocial skills including coping strategies, environmental adaptation, and routines and behaviors.   IMPAIRMENTS are limiting patient from ADLs and IADLs.   COMORBIDITIES may have co-morbidities  that affects occupational performance. Patient will benefit from skilled OT to address above impairments and improve overall function.  PLAN: OT FREQUENCY: 2x/week  OT DURATION: 8 weeks  PLANNED INTERVENTIONS: self care/ADL training, therapeutic exercise, therapeutic activity, neuromuscular re-education, balance training, functional mobility training, moist heat, cryotherapy, patient/family education, cognitive  remediation/compensation, psychosocial skills training, energy conservation, coping strategies training, and DME and/or AE instructions  RECOMMENDED OTHER SERVICES: N/A  CONSULTED AND AGREED WITH PLAN OF CARE: Patient  PLAN FOR NEXT SESSION: Coins and other coordination tasks, functional reach to place items in cabinets in sitting and standing, standing tolerance with BUE functional use, and spelling with handwriting and typing.  Simonne Come, OTR/L 10/18/2021, 1:23 PM

## 2021-10-19 ENCOUNTER — Encounter: Payer: Self-pay | Admitting: Physical Medicine & Rehabilitation

## 2021-10-19 ENCOUNTER — Encounter: Payer: Medicare Other | Attending: Registered Nurse | Admitting: Physical Medicine & Rehabilitation

## 2021-10-19 VITALS — BP 122/65 | HR 68 | Ht 74.0 in

## 2021-10-19 DIAGNOSIS — I63512 Cerebral infarction due to unspecified occlusion or stenosis of left middle cerebral artery: Secondary | ICD-10-CM | POA: Insufficient documentation

## 2021-10-19 MED ORDER — CYCLOBENZAPRINE HCL 5 MG PO TABS: 5.0000 mg | ORAL_TABLET | Freq: Three times a day (TID) | ORAL | 0 refills | Status: DC | PRN

## 2021-10-19 MED ORDER — TRAMADOL HCL 50 MG PO TABS: 25.0000 mg | ORAL_TABLET | Freq: Four times a day (QID) | ORAL | 0 refills | Status: DC | PRN

## 2021-10-19 NOTE — Progress Notes (Signed)
Subjective:    Patient ID: [name] [name], male    DOB: 10-01-1926, 88 y.o.   MRN: [id number] 88 y.o. male with medical history significant of AAA; CAD s/p CABG; chronic systolic CHF; afib s/p Maze, on Xarelto; HTN; HLD; PMR (currently on steroids); pre-diabetes; OSA on CPAP; PAD with lymphedema; and AS s/p TAVR presenting with stroke-like symptoms. He has been having a lot of back pain and has been disabled in an electric wheelchair  HPI  88 year old male with history of left frontal and right occipital infarcts approximately 6 months ago.  He has not had any field cut on the left side or other symptoms of the right occipital infarct however left frontal infarct has produced aphasia and fine motor deficits right upper extremity as well as reduced balance. Patient has a history of bilateral foot drop but no significant stenosis noted on lumbar MRI from 2022. Receiving OP PT and OT 2x per wk. OT working mainly with RUE fine motor tasks, no visuoperceptual issues mentioned in OT notes Discharged from [facility] 10/2 /23 due to goals being met   Patient would like to try aquatic therapy however he has open wounds managed by wound care.  He has a history of bilateral lymphedema in lower extremities. Wife would like to try using some type of lift to help with transfers. Pt has constipation with pain meds   MRI HEAD WITHOUT CONTRAST   TECHNIQUE: Multiplanar, multiecho pulse sequences of the brain and surrounding structures were obtained without intravenous contrast.   COMPARISON:  CT angio head and neck 05/25/2021   FINDINGS: Brain: Small acute infarct right occipital cortex. Small acute infarct in the left frontal cortex over the convexity. Small to moderate cortical infarct left operculum.   Generalized atrophy. Mild chronic microvascular ischemic changes. Small chronic infarcts in the cerebellum bilaterally. Negative for mass. Chronic hemorrhage in the right frontal lobe.   Vascular: Normal  arterial flow voids.   Skull and upper cervical spine: No focal skeletal lesion.   Sinuses/Orbits: Paranasal sinuses clear. Bilateral cataract extraction   Other: None   IMPRESSION: Small areas of acute infarction in the right occipital lobe and left frontal lobe over the convexity. Mild to moderate acute infarct left operculum. Possible cerebral emboli.   Atrophy and mild chronic microvascular ischemia     Electronically Signed   By: Franchot Gallo M.D.   On: 05/25/2021 15:12  CLINICAL DATA:  Low back pain.   EXAM: MRI LUMBAR SPINE WITHOUT CONTRAST   TECHNIQUE: Multiplanar, multisequence MR imaging of the lumbar spine was performed. No intravenous contrast was administered.   COMPARISON:  None.   FINDINGS: Segmentation:  Standard.   Alignment:  Straightening of the lumbar lordosis.   Vertebrae: No fracture, evidence of discitis, or bone lesion. Congenitally short pedicles.   Conus medullaris and cauda equina: Conus extends to the L1 level. Conus and cauda equina appear normal.   Paraspinal and other soft tissues: Infrarenal abdominal aorta aneurysm measuring up to 3.2 cm. A 3.1 cm right renal cyst.   Disc levels:   T12-L1: Shallow disc bulge and mild facet degenerative changes without significant spinal canal or neural foraminal stenosis.   L1-2: Disc bulge, facet degenerative changes and ligamentum flavum redundancy resulting in mild spinal canal stenosis, moderate right and mild left neural foraminal narrowing.   L2-3: Disc bulge, facet degenerative change ligamentum flavum redundancy resulting in mild spinal canal stenosis and mild right neural foraminal narrowing.   L3-4: Disc bulge, facet degenerative  changes and ligamentum flavum redundancy resulting in mild to moderate spinal canal stenosis and mild right neural foraminal narrowing.   L4-5: Disc bulge with superimposed small central disc protrusion with annular tear, facet degenerative changes  ligamentum flavum redundancy resulting in moderate spinal canal stenosis and mild bilateral neural foraminal narrowing.   L5-S1: Loss of disc height, disc bulge with associated osteophytic component and mild facet degenerative changes without significant spinal canal or neural stenosis.   IMPRESSION: 1. Multilevel degenerative changes of the lumbar spine with multilevel spinal canal stenosis which is moderate at L4-L5 and mild to moderate at L3-L4. 2. Infrarenal abdominal aorta aneurysm measuring up to 3.2 cm.     Electronically Signed   By: Pedro Earls M.D.   On: 01/08/2020 15:34   Pain Inventory Average Pain 3 Pain Right Now 2 My pain is constant, burning, and dull  LOCATION OF PAIN  back, right hip, right leg BOWEL Number of stools per week: 7 Oral laxative use Yes  Type of laxative miralax  BLADDER Pads Bladder incontinence Yes   Difficulty starting stream Yes  Incomplete bladder emptying Yes    Mobility how many minutes can you walk? Not walking ability to climb steps?  no use a wheelchair needs help with transfers transfers alone Do you have any goals in this area?  no  Function retired I need assistance with the following:  dressing, bathing, toileting, and household duties Do you have any goals in this area?  yes  Neuro/Psych bladder control problems weakness numbness tingling trouble walking confusion depression anxiety loss of taste or smell  Prior Studies Any changes since last visit?  yes, left toe xray  Physicians involved in your care Any changes since last visit?  no   Family History  Problem Relation Age of Onset   Dementia Mother    Stroke Brother    Social History   Socioeconomic History   Marital status: Married    Spouse name: Not on file   Number of children: 4   Years of education: Not on file   Highest education level: Not on file  Occupational History   Occupation: retired    Comment:  [employer]  Tobacco Use   Smoking status: Former    Packs/day: 0.50    Years: 15.00    Total pack years: 7.50    Types: Cigarettes    Quit date: 11/18/1962    Years since quitting: 58.9    Passive exposure: Never   Smokeless tobacco: Never  Vaping Use   Vaping Use: Never used  Substance and Sexual Activity   Alcohol use: Yes    Alcohol/week: 2.0 standard drinks of alcohol    Types: 2 Shots of liquor per week    Comment: daily, not in last few days   Drug use: Never   Sexual activity: Yes  Other Topics Concern   Not on file  Social History Narrative   Lives with wife in [place].     Very active physically without significant limitations.    Exercises regularly   Social Determinants of Health   Financial Resource Strain: Not on file  Food Insecurity: Not on file  Transportation Needs: Not on file  Physical Activity: Not on file  Stress: Not on file  Social Connections: Not on file   Past Surgical History:  Procedure Laterality Date   ABDOMINAL AORTOGRAM W/LOWER EXTREMITY N/A 10/26/2020   Procedure: ABDOMINAL AORTOGRAM W/LOWER EXTREMITY;  Surgeon: Broadus John, MD;  Location: [facility];  Service: Cardiovascular;  Laterality: N/A;   CARDIAC CATHETERIZATION     CATARACT EXTRACTION, BILATERAL     with lens implants   CHOLECYSTECTOMY N/A 03/24/2012   Procedure: LAPAROSCOPIC CHOLECYSTECTOMY WITH INTRAOPERATIVE CHOLANGIOGRAM;  Surgeon: Zenovia Jarred, MD;  Location: [facility];  Service: General;  Laterality: N/A;   CORONARY ARTERY BYPASS GRAFT N/A 03/06/2012   Procedure: CORONARY ARTERY BYPASS GRAFTING (CABG);  Surgeon: Rexene Alberts, MD;  Location: [facility];  Service: Open Heart Surgery;  Laterality: N/A;   ENDOVEIN HARVEST OF GREATER SAPHENOUS VEIN Right 03/06/2012   Procedure: ENDOVEIN HARVEST OF GREATER SAPHENOUS VEIN;  Surgeon: Rexene Alberts, MD;  Location: [facility];  Service: Open Heart Surgery;  Laterality: Right;   INTRAOPERATIVE TRANSESOPHAGEAL  ECHOCARDIOGRAM N/A 03/06/2012   Procedure: INTRAOPERATIVE TRANSESOPHAGEAL ECHOCARDIOGRAM;  Surgeon: Rexene Alberts, MD;  Location: [facility];  Service: Open Heart Surgery;  Laterality: N/A;   IR KYPHO LUMBAR INC FX REDUCE BONE BX UNI/BIL CANNULATION INC/IMAGING  02/23/2020   LEFT HEART CATH Right 03/02/2012   Procedure: LEFT HEART CATH;  Surgeon: Sherren Mocha, MD;  [facility];  Service: Cardiovascular;  Laterality: Right;   MAZE N/A 03/06/2012   Procedure: MAZE;  Surgeon: Rexene Alberts, MD;  Location: [facility];  Service: Open Heart Surgery;  Laterality: N/A;   PERIPHERAL VASCULAR INTERVENTION Right 10/26/2020   Procedure: PERIPHERAL VASCULAR INTERVENTION;  Surgeon: Broadus John, MD;  Location: [facility];  Service: Cardiovascular;  Laterality: Right;   PROSTATECTOMY     partial   RIGHT/LEFT HEART CATH AND CORONARY/GRAFT ANGIOGRAPHY N/A 09/29/2019   Procedure: RIGHT/LEFT HEART CATH AND CORONARY/GRAFT ANGIOGRAPHY;  Surgeon: Adrian Prows, MD;  Location: [facility];  Service: Cardiovascular;  Laterality: N/A;   TEE WITHOUT CARDIOVERSION N/A 11/10/2019   Procedure: TRANSESOPHAGEAL ECHOCARDIOGRAM (TEE);  Surgeon: Burnell Blanks, MD;  Location: [facility];  Service: Open Heart Surgery;  Laterality: N/A;   TRANSCATHETER AORTIC VALVE REPLACEMENT, TRANSFEMORAL  11/10/2019   Past Medical History:  Diagnosis Date   AAA (abdominal aortic aneurysm) (HCC)    Arthritis    Ascending aorta dilatation ([place]) 03/13/2012   Fusiform dilatation of the ascending thoracic aorta discovered during surgery, max diameter 4.2-4.3 cm   BPH (benign prostatic hypertrophy)    Chronic systolic heart failure ([place]) 03/03/2012   Chronic venous insufficiency    GERD (gastroesophageal reflux disease)    Glaucoma    Gout    History of kidney stones    Hyperlipidemia    Hypertension    Obesity (BMI 30-39.9)    Paroxysmal atrial fibrillation (HCC) 02/29/2012   Recurrent paroxysmal, new-onset    PMR  (polymyalgia rheumatica) (HCC)    Polymyalgia rheumatica ([place]) 02/06/2019   Pre-diabetes    S/P CABG x 3 03/06/2012   LIMA to LAD, SVG to OM, SVG to RCA, EVH via right thigh   S/P Maze operation for atrial fibrillation 03/06/2012   Complete bilateral lesions set using bipolar radiofrequency and cryothermy ablation with clipping of LA appendage   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   s/p TAVR with a 26 mm Edwards via the left subclavian by Drs Buena Irish and Bartle   Severe aortic stenosis 09/29/2019   Sleep apnea    USES CPAP NIGHTLY   There were no vitals taken for this visit.  Opioid Risk Score:   Fall Risk Score:  `1  Depression screen [facility] 2/9     08/17/2021  1:00 PM 07/03/2021   10:23 AM 05/05/2012    2:53 PM 04/16/2012    2:06 PM 07/13/2011    3:34 PM 04/18/2011    8:56 AM  Depression screen PHQ 2/9  Decreased Interest 1 0 0 0 0 0  Down, Depressed, Hopeless 1 0 0 0 0 1  PHQ - 2 Score 2 0 0 0 0 1  Altered sleeping  0      Tired, decreased energy  1      Change in appetite  0      Feeling bad or failure about yourself   1      Trouble concentrating  2      Moving slowly or fidgety/restless  3      Suicidal thoughts  0      PHQ-9 Score  7      Difficult doing work/chores  Very difficult        Review of Systems  Musculoskeletal:  Positive for gait problem.  Neurological:        Tingling  Psychiatric/Behavioral:  Positive for confusion.        Depression, anxiety  All other systems reviewed and are negative.      Objective:   Physical Exam  Well-developed well-nourished male no acute distress Mood and affect are appropriate Speech without dysarthria he has occasional word finding deficits His motor strength is 5/5 bilateral deltoid bicep tricep grip 4/5 bilateral hip flexor and knee extensor 0/5 ankle dorsiflexor and plantar flexor bilaterally Tone is normal in the upper extremities reduced tone at the ankles Sit to stand is with  supervision to min assist. He does not have his walker with him therefore ambulation was not attempted.     Assessment & Plan:   1.  History of left frontal infarct residual anomia as well as fine motor deficits right upper extremity and gait disorder. He is approximately 6 months post stroke and his physical status is starting to plateau.  He does feel benefit from further rehab.  Family will explore private pay options once he graduates from neuro rehab program. Longer term goal would be to get into aquatic therapy however patient has open wounds therefore we cannot make referral at this time Recommend trial of steady left for wife's use at times patient is more unsteady than others Patient has chronic low back pain due to lumbar stenosis we discussed minimizing medication use he used 30 tablets of tramadol in 3 months or approximately 10 tablets/month.  I do not think this is unreasonable He also has used 1-2 cyclobenzaprine's per week at night to help with sleep and leg spasms at night.  We cautioned that this can cause fall risk to increase however he states he does not get up at night and wears a diaper

## 2021-10-19 NOTE — Patient Instructions (Signed)
Please note that cyclobenzaprine is not recommended for elderly pts since it causes falls, would use only at night on occasion  Pleae ask therapy about trying a Steadi lift in PT or OT

## 2021-10-20 ENCOUNTER — Ambulatory Visit: Payer: Medicare Other

## 2021-10-20 DIAGNOSIS — R41840 Attention and concentration deficit: Secondary | ICD-10-CM | POA: Diagnosis not present

## 2021-10-20 DIAGNOSIS — R2681 Unsteadiness on feet: Secondary | ICD-10-CM | POA: Diagnosis not present

## 2021-10-20 DIAGNOSIS — R208 Other disturbances of skin sensation: Secondary | ICD-10-CM | POA: Diagnosis not present

## 2021-10-20 DIAGNOSIS — R262 Difficulty in walking, not elsewhere classified: Secondary | ICD-10-CM | POA: Diagnosis not present

## 2021-10-20 DIAGNOSIS — R2689 Other abnormalities of gait and mobility: Secondary | ICD-10-CM | POA: Diagnosis not present

## 2021-10-20 DIAGNOSIS — R4701 Aphasia: Secondary | ICD-10-CM | POA: Diagnosis not present

## 2021-10-20 DIAGNOSIS — R41841 Cognitive communication deficit: Secondary | ICD-10-CM

## 2021-10-20 DIAGNOSIS — I69351 Hemiplegia and hemiparesis following cerebral infarction affecting right dominant side: Secondary | ICD-10-CM | POA: Diagnosis not present

## 2021-10-20 DIAGNOSIS — M6281 Muscle weakness (generalized): Secondary | ICD-10-CM | POA: Diagnosis not present

## 2021-10-20 NOTE — Therapy (Signed)
OUTPATIENT PHYSICAL THERAPY NEURO TREATMENT   Patient Name: [name] [name] MRN: [id number] DOB:06-Jun-1926, 88 y.o., male Today's Date: 10/23/2021   PCP: [name], MD REFERRING PROVIDER: Charlett Blake, MD    PT End of Session - 10/23/21 1443     Visit Number 13    Number of Visits 16    Date for PT Re-Evaluation 11/07/21    Authorization Type BCBS Medicare    Authorization Time Period no VL, no auth    Progress Note Due on Visit 20    PT Start Time 1405    PT Stop Time 1443    PT Time Calculation (min) 38 min    Activity Tolerance Patient limited by pain    Behavior During Therapy [facility] for tasks assessed/performed                      Past Medical History:  Diagnosis Date   AAA (abdominal aortic aneurysm) ([place])    Arthritis    Ascending aorta dilatation ([place]) 03/13/2012   Fusiform dilatation of the ascending thoracic aorta discovered during surgery, max diameter 4.2-4.3 cm   BPH (benign prostatic hypertrophy)    Chronic systolic heart failure ([place]) 03/03/2012   Chronic venous insufficiency    GERD (gastroesophageal reflux disease)    Glaucoma    Gout    History of kidney stones    Hyperlipidemia    Hypertension    Obesity (BMI 30-39.9)    Paroxysmal atrial fibrillation ([place]) 02/29/2012   Recurrent paroxysmal, new-onset    PMR (polymyalgia rheumatica) (HCC)    Polymyalgia rheumatica ([place]) 02/06/2019   Pre-diabetes    S/P CABG x 3 03/06/2012   LIMA to LAD, SVG to OM, SVG to RCA, EVH via right thigh   S/P Maze operation for atrial fibrillation 03/06/2012   Complete bilateral lesions set using bipolar radiofrequency and cryothermy ablation with clipping of LA appendage   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   s/p TAVR with a 26 mm Edwards via the left subclavian by Drs Buena Irish and Bartle   Severe aortic stenosis 09/29/2019   Sleep apnea    USES CPAP NIGHTLY   Past Surgical History:  Procedure Laterality Date    ABDOMINAL AORTOGRAM W/LOWER EXTREMITY N/A 10/26/2020   Procedure: ABDOMINAL AORTOGRAM W/LOWER EXTREMITY;  Surgeon: Broadus John, MD;  Location: [place] [facility];  Service: Cardiovascular;  Laterality: N/A;   CARDIAC CATHETERIZATION     CATARACT EXTRACTION, BILATERAL     with lens implants   CHOLECYSTECTOMY N/A 03/24/2012   Procedure: LAPAROSCOPIC CHOLECYSTECTOMY WITH INTRAOPERATIVE CHOLANGIOGRAM;  Surgeon: Zenovia Jarred, MD;  Location: [place];  Service: General;  Laterality: N/A;   CORONARY ARTERY BYPASS GRAFT N/A 03/06/2012   Procedure: CORONARY ARTERY BYPASS GRAFTING (CABG);  Surgeon: Rexene Alberts, MD;  Location: [place];  Service: Open Heart Surgery;  Laterality: N/A;   ENDOVEIN HARVEST OF GREATER SAPHENOUS VEIN Right 03/06/2012   Procedure: ENDOVEIN HARVEST OF GREATER SAPHENOUS VEIN;  Surgeon: Rexene Alberts, MD;  Location: [place];  Service: Open Heart Surgery;  Laterality: Right;   INTRAOPERATIVE TRANSESOPHAGEAL ECHOCARDIOGRAM N/A 03/06/2012   Procedure: INTRAOPERATIVE TRANSESOPHAGEAL ECHOCARDIOGRAM;  Surgeon: Rexene Alberts, MD;  Location: [place];  Service: Open Heart Surgery;  Laterality: N/A;   IR KYPHO LUMBAR INC FX REDUCE BONE BX UNI/BIL CANNULATION INC/IMAGING  02/23/2020   LEFT HEART CATH Right 03/02/2012   Procedure: LEFT HEART CATH;  Surgeon: Sherren Mocha, MD;  Location:  [place] [facility];  Service: Cardiovascular;  Laterality: Right;   MAZE N/A 03/06/2012   Procedure: MAZE;  Surgeon: Rexene Alberts, MD;  Location: [place];  Service: Open Heart Surgery;  Laterality: N/A;   PERIPHERAL VASCULAR INTERVENTION Right 10/26/2020   Procedure: PERIPHERAL VASCULAR INTERVENTION;  Surgeon: Broadus John, MD;  Location: [place] [facility];  Service: Cardiovascular;  Laterality: Right;   PROSTATECTOMY     partial   RIGHT/LEFT HEART CATH AND CORONARY/GRAFT ANGIOGRAPHY N/A 09/29/2019   Procedure: RIGHT/LEFT HEART CATH AND CORONARY/GRAFT ANGIOGRAPHY;  Surgeon: Adrian Prows, MD;  Location: [place] [facility]  [facility];  Service: Cardiovascular;  Laterality: N/A;   TEE WITHOUT CARDIOVERSION N/A 11/10/2019   Procedure: TRANSESOPHAGEAL ECHOCARDIOGRAM (TEE);  Surgeon: Burnell Blanks, MD;  Location: [place];  Service: Open Heart Surgery;  Laterality: N/A;   TRANSCATHETER AORTIC VALVE REPLACEMENT, TRANSFEMORAL  11/10/2019   Patient Active Problem List   Diagnosis Date Noted   Left middle cerebral artery stroke ([place]) 05/31/2021   Acute CVA (cerebrovascular accident) ([place]) 05/25/2021   DNR (do not resuscitate) 05/25/2021   Osteoporosis 05/05/2021   Gouty arthritis 02/13/2021   Morbid obesity ([place]) 02/13/2021   Abdominal aortic aneurysm without rupture ([place]) 11/22/2020   Acute renal failure syndrome ([place]) 11/22/2020   Low back pain 11/22/2020   Skin tear of right upper arm without complication 78/67/5449   Pressure injury of skin 10/23/2020   Urinary tract infection with hematuria 10/22/2020   Encephalopathy 10/22/2020   AKI (acute kidney injury) ([place])    Overweight (BMI 25.0-29.9) 08/19/2020   Vertebral fracture, osteoporotic ([place]) 08/18/2020   T8 vertebral fracture ([place]) 08/17/2020   Thrombocytopenia ([place]) 08/17/2020   CKD (chronic kidney disease) stage 3, GFR 30-59 ml/min (HCC) 08/17/2020   Elevated troponin 08/17/2020   Pain due to onychomycosis of toenails of both feet 07/27/2020   Callus of heel 07/27/2020   Bifascicular block 11/11/2019   Acute on chronic diastolic heart failure ([place]) 11/10/2019   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   Severe aortic stenosis 09/29/2019   Aortic valve disorder 20/10/710   Systolic heart failure ([place]) 03/11/2019   Thrombophilia ([place]) 02/26/2019   Muscle pain 02/11/2019   Weakness 02/06/2019   Polymyalgia rheumatica ([place]) 02/06/2019   Shoulder joint pain 02/02/2019   Anemia 01/26/2019   Malignant melanoma of skin ([place]) 01/26/2019   Pain in right leg 01/26/2019   DDD (degenerative disc disease), cervical 05/28/2018    Encounter for general adult medical examination without abnormal findings 11/19/2017   Carotid artery occlusion 07/03/2017   Hearing loss 07/03/2017   Fall 06/19/2017   Injury of upper extremity 06/19/2017   Neck pain 06/19/2017   Headache 06/19/2017   Visual disturbance 06/19/2017   Ganglion of hand 05/08/2017   Dilatation of aorta ([place]) 02/14/2017   Plantar fascial fibromatosis 02/14/2017   Cardiac murmur, unspecified 02/27/2016   Benign neoplasm of colon 08/24/2015   Dementia ([place]) 08/24/2015   Influenza due to unidentified influenza virus with other respiratory manifestations 03/15/2015   Epistaxis 12/01/2014   OSA (obstructive sleep apnea) 11/18/2014   Gastro-esophageal reflux disease with esophagitis 10/29/2014   Angina pectoris ([place]) 05/04/2014   Abnormal feces 07/31/2013   Other specified disorders of gingiva and edentulous alveolar ridge 07/31/2013   Spontaneous ecchymosis 10/10/2012   Thoracic ascending aortic aneurysm ([place]) 09/29/2012   Microscopic hematuria 04/11/2012   Proteinuria 04/11/2012   S/P laparoscopic cholecystectomy 04/09/2012   S/P CABG x 3 03/06/2012   S/P Maze  operation for atrial fibrillation 03/06/2012   Paroxysmal atrial fibrillation ([place]) 02/29/2012   Bitten or stung by nonvenomous insect and other nonvenomous arthropods, initial encounter 12/03/2011   Depression screening 10/17/2011   CAD (coronary artery disease), native coronary artery    Hyperlipidemia    Hypertension    Chronic venous insufficiency    Gout    Benign prostatic hyperplasia    Post-traumatic stress disorder 03/13/2011   Pain in left leg 12/08/2010   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   Fatigue 08/08/2010   Retention of urine 04/18/2010   Constipation 07/05/2009   Kidney stone 07/05/2009   ED (erectile dysfunction) of organic origin 12/14/2008   Impaired fasting glucose 12/14/2008   Vitamin B deficiency 12/14/2008    ONSET DATE: 05/31/21  REFERRING DIAG: L24.401  (ICD-10-CM) - Left middle cerebral artery stroke (HCC)   THERAPY DIAG:  Aphasia  Hemiplegia and hemiparesis following cerebral infarction affecting right dominant side (HCC)  Muscle weakness (generalized)  Unsteadiness on feet  Rationale for Evaluation and Treatment Rehabilitation  SUBJECTIVE:                                                                                                                                                                                              SUBJECTIVE STATEMENT: Feels more soreness in the back and R leg today - was doing some hedge clipping in the w/c on Friday. Asking about paying out of pocket for therapy and requesting TENS on LB.   Pt accompanied by: self  PERTINENT HISTORY: medical history significant of AAA; CAD s/p CABG; chronic systolic CHF; afib s/p Maze, on Xarelto; HTN; HLD; PMR (currently on steroids); pre-diabetes; OSA on CPAP; PAD with lymphedema; and AS s/p TAVR   PAIN:  Are you having pain? Yes: NPRS scale: 6/10 Pain location: R LE Pain description: aching, cramp Aggravating factors: standing/walking Relieving factors: recliner, hot pack, stretching  PRECAUTIONS: Fall, pressure sores bilateral calcaneus right > left  PATIENT GOALS be able to stand unsupported for a time to perform ADL, walking if possible  OBJECTIVE:      TODAY'S TREATMENT: 10/23/21 Activity Comments  TENS and MHP to LB x 15 min Amplitude to tolerance; 80-150 hz  Sitting horizontal abduction with yellow TB 2x10 Good form  Sitting red TB clam x15 Limited L LE movement              PATIENT EDUCATION: Education details: answering patient's questions on OOP PT, edu on benefits/features of Steady lift vs. Vertis Kelch; edu on MD's recommendation of AFO and pros and cons of AFO vs. Foot up brace given patient's chronic wounds; discussion  on possible D/C or break from therapy to get pain levels under control before proceeding  Person educated:  Patient Education method: Explanation, Demonstration, Tactile cues, Verbal cues, and Handouts Education comprehension: verbalized understanding     HOME EXERCISE PROGRAM Last updated: 10/11/21 Access Code: [email or web address] URL: [email or web address] Date: 10/11/2021 Prepared by: [facility]  Exercises - Seated Heel Raise  - 1 x daily - 5 x weekly - 2 sets - 10 reps - Seated Toe Raise  - 1 x daily - 5 x weekly - 2 sets - 10 reps - Seated March  - 1 x daily - 5 x weekly - 2 sets - 10 reps - Seated Long Arc Quad  - 1 x daily - 5 x weekly - 2 sets - 10 reps - Seated Hip Adduction Squeeze with Ball  - 1 x daily - 5 x weekly - 2 sets - 10 reps - Row with Anchored Resistance on Swiss Ball  - 1 x daily - 5 x weekly - 2 sets - 10 reps - Seated Shoulder Extension and Scapular Retraction with Resistance on Swiss Ball  - 1 x daily - 5 x weekly - 2 sets - 10 reps - Supine Lower Trunk Rotation  - 1 x daily - 5 x weekly - 2 sets - 20 reps    Below measures were taken at time of initial evaluation unless otherwise specified:   DIAGNOSTIC FINDINGS: IMPRESSION: Small areas of acute infarction in the right occipital lobe and left frontal lobe over the convexity. Mild to moderate acute infarct left operculum. Possible cerebral emboli.   Atrophy and mild chronic microvascular ischemia  COGNITION: Overall cognitive status: Within functional limits for tasks assessed Patient's historical recall is not very accurate, he reports his back injury presented x 9 months ago, whereas medical record notes encounter in 12/2019-01/2020 regarding L1 compression fx   SENSATION: Not tested  COORDINATION: Alt. Foot taps impaired due to dorsiflexion weakness  EDEMA:  Bilateral LE, wearing compression stockings. Uses lymphedema pump  MUSCLE TONE: DNT    POSTURE: rounded shoulders  LOWER EXTREMITY ROM:     Passive  Right Eval Left Eval  Hip flexion    Hip  extension    Hip abduction    Hip adduction    Hip internal rotation    Hip external rotation    Knee flexion    Knee extension    Ankle dorsiflexion -10 -10  Ankle plantarflexion    Ankle inversion    Ankle eversion     (Blank rows = not tested)  LOWER EXTREMITY MMT:    MMT Right Eval Left Eval  Hip flexion 3 3  Hip extension    Hip abduction 2+ 2+  Hip adduction 3 3  Hip internal rotation    Hip external rotation    Knee flexion 3+ 3+  Knee extension 4- 4-  Ankle dorsiflexion 0 0  Ankle plantarflexion    Ankle inversion    Ankle eversion    (Blank rows = not tested)  BED MOBILITY:  Sit to supine Min A and Mod A Supine to sit Min A Pt notes his bed at home has assist rails  TRANSFERS: Assistive device utilized: Wheelchair (manual)  Sit to stand: CGA and Min A Stand to sit: CGA and Min A Chair to chair: CGA and Min A Floor:  DNT   W/C mobility Independent level surfaces  STAIRS:  DNT--pt has elevator to enter/exit home  GAIT: DNT due to safety concerns  FUNCTIONAL TESTs:  5 times sit to stand: unable, requires BUE and assist Static standing: dependent on BUE via RW (supported standing) Straight Leg Raise: 20+ degrees extensor lag  PATIENT SURVEYS:  FOTO to be assessed     PATIENT EDUCATION: Education details: assessment findings Person educated: Patient Education method: Explanation Education comprehension: verbalized understanding   HOME EXERCISE PROGRAM: To be initiated, demonstration/rehearsal of hip add isometric    GOALS: Goals reviewed with patient? Yes  SHORT TERM GOALS: Target date: 09/26/2021  Patient will be independent in HEP to improve functional outcomes Baseline: Goal status: GOAL MET, 09/27/2021  2.  Patient to demonstrate sit to supine w/ supervision using adaptive equipment to reduce level of assist from caregiver Baseline: Min-mod A for BLE to clear EOB; min A with stretch out strap 10/11/21 Goal status: per  report 09/27/2021-NOT MET    LONG TERM GOALS: Target date: 11/07/2021  Demonstrate improved hip abduction strength to 3+/5 to improve standing stability Baseline: 2+/5; 2+/5 L and 4- on R 10/11/21 Goal status: IN PROGRESS 10/11/21  2.  Manifest improved BLE strength as evidenced by ability to perform 5xSTS test in 30 sec using BUE Baseline: unable; 1 min 29 sec 10/11/21 Goal status: IN PROGRESS 10/11/21  3.  Demonstrate static standing balance of Fair (no UE support) x 15 sec to improve safety with ADL performance Baseline: unable, dependent on BUE (Fair-); unchanged 10/11/21 Goal status: IN PROGRESS 10/11/21  4.  Demonstrate transfers w/ set-up assist to increase independence and safety with mobility Baseline: CGA-min A; unchanged 10/11/21 Goal status: IN PROGRESS 10/11/21   ASSESSMENT:  CLINICAL IMPRESSION: Patient arrived to session with report of increased LB and R LE pain today after doing a lot of bending while clipping hedges on Friday. Patient requested TENs to LB, thus provided this and heat to address pain levels, while educating patient on DME that may be beneficial to him. Provided choice to perform standing exercises with foot up brace or sitting ther-ex, patient requested seated exercises for remainder of session d/t LBP, thus completed session with core and postural strengthening activities. Patient reported no increase in pain at end of session.  OBJECTIVE IMPAIRMENTS cardiopulmonary status limiting activity, decreased activity tolerance, decreased balance, decreased knowledge of use of DME, decreased mobility, difficulty walking, decreased ROM, decreased strength, impaired perceived functional ability, and pain.   ACTIVITY LIMITATIONS lifting, bending, standing, squatting, transfers, bed mobility, toileting, dressing, and locomotion level  PARTICIPATION LIMITATIONS: meal prep, cleaning, community activity, yard work, and home mgmt  PERSONAL FACTORS Age, Time since  onset of injury/illness/exacerbation, and 3+ comorbidities: see PMH  are also affecting patient's functional outcome.   REHAB POTENTIAL: Good  CLINICAL DECISION MAKING: Evolving/moderate complexity  EVALUATION COMPLEXITY: Moderate  PLAN: PT FREQUENCY: 2x/week  PT DURATION: 8 weeks  PLANNED INTERVENTIONS: Therapeutic exercises, Therapeutic activity, Neuromuscular re-education, Balance training, Gait training, Patient/Family education, Self Care, Joint mobilization, Stair training, Vestibular training, Canalith repositioning, Orthotic/Fit training, DME instructions, Aquatic Therapy, Dry Needling, Electrical stimulation, Wheelchair mobility training, Spinal mobilization, Cryotherapy, Moist heat, Taping, Ionotophoresis 53m/ml Dexamethasone, and Manual therapy  PLAN FOR NEXT SESSION: probable DC; Transfers/static standing/gait(?) with bilateral platform RW, trial with foot-up cuff?   YJanene Harvey PT, DPT 10/23/21 2:45 PM  [facility] [place] [address]Phone # [phone] # [phone]

## 2021-10-20 NOTE — Patient Instructions (Signed)
   You tube - TED talks  Listen to one or two of these 3-4 days a week and write 3-5 sentences about each. Practice writing the words you have errors with  like we did today. Your wife can assist with the initial writing of the word if needed, but you copy 4-8 times depending on whether the same word was spelled correctly in other areas of your written thoughts.  TALK about these TED talks initially, before you write your thoughts. This may help you as you write your thoughts (the 3-5 sentences).

## 2021-10-20 NOTE — Therapy (Signed)
OUTPATIENT SPEECH LANGUAGE PATHOLOGY TREATMENT   Patient Name: [name] [name] MRN: [id number] DOB:[birth year], 88 y.o., male Today's Date: 10/20/2021  PCP: [name], MD REFERRING PROVIDER: Alysia Penna, MD   End of Session - 10/20/21 1154     Visit Number 7    Number of Visits 17    Date for SLP Re-Evaluation 10/27/21    SLP Start Time 74    SLP Stop Time  1100    SLP Time Calculation (min) 42 min    Activity Tolerance Patient tolerated treatment well              Past Medical History:  Diagnosis Date   AAA (abdominal aortic aneurysm) ([place])    Arthritis    Ascending aorta dilatation ([place]) 03/13/2012   Fusiform dilatation of the ascending thoracic aorta discovered during surgery, max diameter 4.2-4.3 cm   BPH (benign prostatic hypertrophy)    Chronic systolic heart failure ([place]) 03/03/2012   Chronic venous insufficiency    GERD (gastroesophageal reflux disease)    Glaucoma    Gout    History of kidney stones    Hyperlipidemia    Hypertension    Obesity (BMI 30-39.9)    Paroxysmal atrial fibrillation ([place]) 02/29/2012   Recurrent paroxysmal, new-onset    PMR (polymyalgia rheumatica) (HCC)    Polymyalgia rheumatica ([place]) 02/06/2019   Pre-diabetes    S/P CABG x 3 03/06/2012   LIMA to LAD, SVG to OM, SVG to RCA, EVH via right thigh   S/P Maze operation for atrial fibrillation 03/06/2012   Complete bilateral lesions set using bipolar radiofrequency and cryothermy ablation with clipping of LA appendage   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   s/p TAVR with a 26 mm Edwards via the left subclavian by Drs Buena Irish and Bartle   Severe aortic stenosis 09/29/2019   Sleep apnea    USES CPAP NIGHTLY   Past Surgical History:  Procedure Laterality Date   ABDOMINAL AORTOGRAM W/LOWER EXTREMITY N/A 10/26/2020   Procedure: ABDOMINAL AORTOGRAM W/LOWER EXTREMITY;  Surgeon: Broadus John, MD;  Location: [place] [facility];  Service:  Cardiovascular;  Laterality: N/A;   CARDIAC CATHETERIZATION     CATARACT EXTRACTION, BILATERAL     with lens implants   CHOLECYSTECTOMY N/A 03/24/2012   Procedure: LAPAROSCOPIC CHOLECYSTECTOMY WITH INTRAOPERATIVE CHOLANGIOGRAM;  Surgeon: Zenovia Jarred, MD;  Location: [facility];  Service: General;  Laterality: N/A;   CORONARY ARTERY BYPASS GRAFT N/A 03/06/2012   Procedure: CORONARY ARTERY BYPASS GRAFTING (CABG);  Surgeon: Rexene Alberts, MD;  Location: [facility];  Service: Open Heart Surgery;  Laterality: N/A;   ENDOVEIN HARVEST OF GREATER SAPHENOUS VEIN Right 03/06/2012   Procedure: ENDOVEIN HARVEST OF GREATER SAPHENOUS VEIN;  Surgeon: Rexene Alberts, MD;  Location: [facility];  Service: Open Heart Surgery;  Laterality: Right;   INTRAOPERATIVE TRANSESOPHAGEAL ECHOCARDIOGRAM N/A 03/06/2012   Procedure: INTRAOPERATIVE TRANSESOPHAGEAL ECHOCARDIOGRAM;  Surgeon: Rexene Alberts, MD;  Location: [place];  Service: Open Heart Surgery;  Laterality: N/A;   IR KYPHO LUMBAR INC FX REDUCE BONE BX UNI/BIL CANNULATION INC/IMAGING  02/23/2020   LEFT HEART CATH Right 03/02/2012   Procedure: LEFT HEART CATH;  Surgeon: Sherren Mocha, MD;  Location: [facility];  Service: Cardiovascular;  Laterality: Right;   MAZE N/A 03/06/2012   Procedure: MAZE;  Surgeon: Rexene Alberts, MD;  Location: [facility];  Service: Open Heart Surgery;  Laterality: N/A;   PERIPHERAL VASCULAR INTERVENTION Right 10/26/2020   Procedure: PERIPHERAL  VASCULAR INTERVENTION;  Surgeon: Broadus John, MD;  Location: [place] [facility];  Service: Cardiovascular;  Laterality: Right;   PROSTATECTOMY     partial   RIGHT/LEFT HEART CATH AND CORONARY/GRAFT ANGIOGRAPHY N/A 09/29/2019   Procedure: RIGHT/LEFT HEART CATH AND CORONARY/GRAFT ANGIOGRAPHY;  Surgeon: Adrian Prows, MD;  Location: [place] [facility];  Service: Cardiovascular;  Laterality: N/A;   TEE WITHOUT CARDIOVERSION N/A 11/10/2019   Procedure: TRANSESOPHAGEAL ECHOCARDIOGRAM (TEE);  Surgeon: Burnell Blanks,  MD;  Location: [place];  Service: Open Heart Surgery;  Laterality: N/A;   TRANSCATHETER AORTIC VALVE REPLACEMENT, TRANSFEMORAL  11/10/2019   Patient Active Problem List   Diagnosis Date Noted   Left middle cerebral artery stroke ([place]) 05/31/2021   Acute CVA (cerebrovascular accident) ([place]) 05/25/2021   DNR (do not resuscitate) 05/25/2021   Osteoporosis 05/05/2021   Gouty arthritis 02/13/2021   Morbid obesity ([place]) 02/13/2021   Abdominal aortic aneurysm without rupture ([place]) 11/22/2020   Acute renal failure syndrome ([place]) 11/22/2020   Low back pain 11/22/2020   Skin tear of right upper arm without complication 31/54/0086   Pressure injury of skin 10/23/2020   Urinary tract infection with hematuria 10/22/2020   Encephalopathy 10/22/2020   AKI (acute kidney injury) ([place])    Overweight (BMI 25.0-29.9) 08/19/2020   Vertebral fracture, osteoporotic ([place]) 08/18/2020   T8 vertebral fracture ([place]) 08/17/2020   Thrombocytopenia ([place]) 08/17/2020   CKD (chronic kidney disease) stage 3, GFR 30-59 ml/min (HCC) 08/17/2020   Elevated troponin 08/17/2020   Pain due to onychomycosis of toenails of both feet 07/27/2020   Callus of heel 07/27/2020   Bifascicular block 11/11/2019   Acute on chronic diastolic heart failure ([place]) 11/10/2019   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   Severe aortic stenosis 09/29/2019   Aortic valve disorder 76/19/5093   Systolic heart failure ([place]) 03/11/2019   Thrombophilia ([place]) 02/26/2019   Muscle pain 02/11/2019   Weakness 02/06/2019   Polymyalgia rheumatica ([place]) 02/06/2019   Shoulder joint pain 02/02/2019   Anemia 01/26/2019   Malignant melanoma of skin ([place]) 01/26/2019   Pain in right leg 01/26/2019   DDD (degenerative disc disease), cervical 05/28/2018   Encounter for general adult medical examination without abnormal findings 11/19/2017   Carotid artery occlusion 07/03/2017   Hearing loss 07/03/2017   Fall 06/19/2017   Injury of  upper extremity 06/19/2017   Neck pain 06/19/2017   Headache 06/19/2017   Visual disturbance 06/19/2017   Ganglion of hand 05/08/2017   Dilatation of aorta ([place]) 02/14/2017   Plantar fascial fibromatosis 02/14/2017   Cardiac murmur, unspecified 02/27/2016   Benign neoplasm of colon 08/24/2015   Dementia ([place]) 08/24/2015   Influenza due to unidentified influenza virus with other respiratory manifestations 03/15/2015   Epistaxis 12/01/2014   OSA (obstructive sleep apnea) 11/18/2014   Gastro-esophageal reflux disease with esophagitis 10/29/2014   Angina pectoris ([place]) 05/04/2014   Abnormal feces 07/31/2013   Other specified disorders of gingiva and edentulous alveolar ridge 07/31/2013   Spontaneous ecchymosis 10/10/2012   Thoracic ascending aortic aneurysm ([place]) 09/29/2012   Microscopic hematuria 04/11/2012   Proteinuria 04/11/2012   S/P laparoscopic cholecystectomy 04/09/2012   S/P CABG x 3 03/06/2012   S/P Maze operation for atrial fibrillation 03/06/2012   Paroxysmal atrial fibrillation ([place]) 02/29/2012   Bitten or stung by nonvenomous insect and other nonvenomous arthropods, initial encounter 12/03/2011   Depression screening 10/17/2011   CAD (coronary artery disease), native coronary artery    Hyperlipidemia  Hypertension    Chronic venous insufficiency    Gout    Benign prostatic hyperplasia    Post-traumatic stress disorder 03/13/2011   Pain in left leg 12/08/2010   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   Fatigue 08/08/2010   Retention of urine 04/18/2010   Constipation 07/05/2009   Kidney stone 07/05/2009   ED (erectile dysfunction) of organic origin 12/14/2008   Impaired fasting glucose 12/14/2008   Vitamin B deficiency 12/14/2008    ONSET DATE: 05/25/21   REFERRING DIAG: T51.761 (ICD-10-CM) - Left middle cerebral artery stroke   THERAPY DIAG:  Aphasia  Cognitive communication deficit  Rationale for Evaluation and Treatment Rehabilitation  SUBJECTIVE:    SUBJECTIVE STATEMENT: "I'm really disappointed about my PT ending after next time." Pt accompanied by: self  PERTINENT HISTORY:  ESA RADEN is a 88 y.o. male with medical history significant of AAA; CAD s/p CABG; chronic systolic CHF; afib s/p Maze, on Xarelto; HTN; HLD; PMR (currently on steroids); pre-diabetes; OSA on CPAP; PAD with lymphedema; and AS s/p TAVR presenting with stroke-like symptoms on 05/25/21. FAmily had a meeting with bad news.  Shortly thereafter, he was trembling on one side with slurred speech.  His episode lasted less than a minute but the speech difficulty persisted. He completed home health PT OT and speech and arrives at OP rehab for his continuation of rehabilitative care.  Inpatient rehab discharge note: "Patient has made excellent gains and has met 5 of 5 long-term goals this admission due to improved motor speech, word finding skills, complex problem solving, and attention. Pt is completing high level cognitive-linguistic tasks with mod I-to-sup A in regards to problem solving, attention, and functional recall. Pt demonstrates improved self-monitoring and self correction of verbal errors when communicating at the phrase/sentence level. Pt continues to exhibit mild higher level word finding, spelling, motor speech production, and alternating attention deficits. Patient and family education is complete and patient to discharge at overall mod I-to-sup A level. Patient's care partner is independent to provide the necessary physical and cognitive assistance at discharge. Pt would continue to benefit from skilled ST services in order to maximize functional independence and reduce burden of care, with continued skilled ST services at discharge."  PAIN:  Are you having pain? Yes: NPRS scale: 3/10 Pain location: lumbar spine Pain description: aching, sore, sharp Aggravating factors: standing/walking Relieving factors: recliner  PATIENT GOALS  Speak intelligently, write with  better spelling ability  OBJECTIVE:   TODAY'S TREATMENT:  10/20/21: Pt expresses his disappointment with PT being discharged in 1-2 visits; SLP looked at physiatrist appt yesterday and saw aquatic therapy was recommended when open wounds are healed. SLP encouraged pt to talk with PTs and make sure he understands the rationale behind the discharge, and inquire if there are other therapy programs [facility] program) that he could still attend.  Pt mentioned that he still cont to make errors with writing. SLP encouraged pt to practice writing in order to decr his frequency of written aphasia. SLP targeted this today. Pt wrote about current events that have his attention. He made 4 errors, and noted each one. SLP had pt copy SLP production and pt with rare errors when copying. SLP told pt to do this at home as well, with TED talk discussions with wife. Pt and SLP discussed some other home tasks pt could cont to do with language and speech that would cont to have him progress even after d/c from [place].   10/02/21: Pt engaged in mod complex/complex conversation  with SLP today for 20 minutes about sons and daughters and their children and occupations. Pt with anomia with "accountant" and "satellite" but words were generated within 3 seconds extra time. Written language targeted afterwards; pt made errand list with 86% success (6/7), with awareness. Written homework provided for pt. Pt agrees he is appropriate at this time to decr to once/week next week due to progress.   09/27/21: Pt reports no additional social situations since last session but SLP encouraged pt to cont to engage in those situations socially, and to cont to use slower rate and other compensations which he is so successful with in therapy.  SLP targeted verbal expression in mod complex stimuli - with extra time pt self-corrected all responses. The last 5 minutes pt with more frequent self correction necessary. Homework provided for written and spoken  responses.   09/25/21: "I was with friends yesterday - we went out for brunch - and I felt it more difficult to speak clearly. I really had to think about what I have to say." SLP and pt with long (10-minute) discussion on self-advocating in a conversation about his need for slower rate of speech in order to improve accuracy in verbal expression. Pt indicated understanding and agreed that self-advocating would be a good thing for him to do. With three-four sentence response stimuli (verbal) pt used slower rate and errors (approx 1 per sentence) were detected and remediated successfully. SLP provided pt homework for writing at word level, and speaking at sentence/2-sentence level.  Pt named 8 items/60 seconds in a category with independence.  09/18/21: [place] was able to communicate his intended message with success using slowed speech rate, circumlocution, and synonym strategies. He was aware of errors 100% of the time. SLP assisted pt with word level aphasia (written) with cues for error words and then min-mod cues for letter accuracy. Homework for written aphasia at the word/phrase level was provided.  09/13/21: Assessed pt's writing skills for language ability today. Artha made aphasic errors in all sets of responses (divergent naming tasks). Pt aware of errors 75% of the time. With error words SLP provided "fill in the blank", and pt with correct response 80% of the time. SLP provided him practice with written language accuracy, with instructions to wife to assist if he has difficulty with the same means (fill in the blank).  08/29/21: Discussed evaluation results, probable goals, and discussed pt's need to write more and provide himself more opportunity for practice.   PATIENT EDUCATION: Education details: see above in "today's treatment" Person educated: Patient Education method: Explanation Education comprehension: verbalized understanding and needs further education    PATIENT REPORTED OUTCOME  MEASURES (PROM): Communication Participation Item Bank: to be completed next session and Communication Effectiveness Survey: to be completed next session    GOALS: Goals reviewed with patient? Yes  SHORT TERM GOALS: Target date: 10/10/2021  - due to 4 therapy sessions prior to date of 09-26-21  Pt will name average 8 items/60 seconds in simple category important to pt in 3 sessions Baseline:09-25-21, 09-27-21 Goal status: Met  2.  Pt will write to-do lists, errand lists (1-3 word responses) functionally in 3 sessions Baseline: 10-02-21 Goal status: Ongoing  3.  Pt will participate in 8 minutes functional mod complex conversation with modified independence in 2 sessions Baseline: 09-18-21 Goal status: Met    LONG TERM GOALS: Target date: 10/24/2021   Pt will write sentences of 5+ words, functionally, with rare min A in 3 sessions Baseline: 10-20-21  Goal status: Ongoing  2.   Pt will participate in 10 minutes functional mod complex-complex conversation with modified independence in 3 sessions Baseline: 10-20-21 Goal status: Ongoing   ASSESSMENT:  CLINICAL IMPRESSION: Patient is a 88 y.o. male who was seen today for speech and language therapy following CVA in May 2023. See [place] NOTE.Pt's mod complex verbal expression continues functional with consistent extra time. He is still making occasional errors in written language - SLP providing consistent homework with pt's written langauge at sentence/multi-sentence levels.   OBJECTIVE IMPAIRMENTS include expressive language and aphasia. These impairments are limiting patient from ADLs/IADLs and effectively communicating at home and in community. Factors affecting potential to achieve goals and functional outcome are  none . Patient will benefit from skilled SLP services to address above impairments and improve overall function.  REHAB POTENTIAL: Good  PLAN: SLP FREQUENCY: 2x/week  SLP DURATION: 8 weeks  PLANNED INTERVENTIONS:  Language facilitation, Environmental controls, Cueing hierachy, Internal/external aids, Oral motor exercises, Functional tasks, Multimodal communication approach, SLP instruction and feedback, Compensatory strategies, and Patient/family education    [facility], [place] 10/20/2021, 11:55 AM

## 2021-10-23 ENCOUNTER — Ambulatory Visit: Payer: Medicare Other | Admitting: Occupational Therapy

## 2021-10-23 ENCOUNTER — Encounter: Payer: Self-pay | Admitting: Physical Therapy

## 2021-10-23 ENCOUNTER — Ambulatory Visit: Payer: Medicare Other | Admitting: Physical Therapy

## 2021-10-23 DIAGNOSIS — R2681 Unsteadiness on feet: Secondary | ICD-10-CM

## 2021-10-23 DIAGNOSIS — R262 Difficulty in walking, not elsewhere classified: Secondary | ICD-10-CM | POA: Diagnosis not present

## 2021-10-23 DIAGNOSIS — I69351 Hemiplegia and hemiparesis following cerebral infarction affecting right dominant side: Secondary | ICD-10-CM | POA: Diagnosis not present

## 2021-10-23 DIAGNOSIS — R2689 Other abnormalities of gait and mobility: Secondary | ICD-10-CM | POA: Diagnosis not present

## 2021-10-23 DIAGNOSIS — R41840 Attention and concentration deficit: Secondary | ICD-10-CM

## 2021-10-23 DIAGNOSIS — M6281 Muscle weakness (generalized): Secondary | ICD-10-CM | POA: Diagnosis not present

## 2021-10-23 DIAGNOSIS — R4701 Aphasia: Secondary | ICD-10-CM | POA: Diagnosis not present

## 2021-10-23 DIAGNOSIS — R41841 Cognitive communication deficit: Secondary | ICD-10-CM | POA: Diagnosis not present

## 2021-10-23 DIAGNOSIS — R208 Other disturbances of skin sensation: Secondary | ICD-10-CM | POA: Diagnosis not present

## 2021-10-23 NOTE — Therapy (Signed)
OUTPATIENT OCCUPATIONAL THERAPY  TREATMENT NOTE  Patient Name: [name] [name] MRN: [id number] DOB:1926/02/24, 88 y.o., male Today's Date: 10/23/2021  PCP: [name], MD REFERRING PROVIDER: Charlett Blake, MD    OT End of Session - 10/23/21 1346     Visit Number 14    Number of Visits 17    Date for OT Re-Evaluation 10/27/21    Authorization Type BCBS Medicare    OT Start Time 1318    OT Stop Time 1400    OT Time Calculation (min) 42 min    Activity Tolerance Patient tolerated treatment well    Behavior During Therapy WFL for tasks assessed/performed               Past Medical History:  Diagnosis Date   AAA (abdominal aortic aneurysm) ([place])    Arthritis    Ascending aorta dilatation ([place]) 03/13/2012   Fusiform dilatation of the ascending thoracic aorta discovered during surgery, max diameter 4.2-4.3 cm   BPH (benign prostatic hypertrophy)    Chronic systolic heart failure ([place]) 03/03/2012   Chronic venous insufficiency    GERD (gastroesophageal reflux disease)    Glaucoma    Gout    History of kidney stones    Hyperlipidemia    Hypertension    Obesity (BMI 30-39.9)    Paroxysmal atrial fibrillation ([place]) 02/29/2012   Recurrent paroxysmal, new-onset    PMR (polymyalgia rheumatica) (HCC)    Polymyalgia rheumatica ([place]) 02/06/2019   Pre-diabetes    S/P CABG x 3 03/06/2012   LIMA to LAD, SVG to OM, SVG to RCA, EVH via right thigh   S/P Maze operation for atrial fibrillation 03/06/2012   Complete bilateral lesions set using bipolar radiofrequency and cryothermy ablation with clipping of LA appendage   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   s/p TAVR with a 26 mm Edwards via the left subclavian by Drs Buena Irish and Bartle   Severe aortic stenosis 09/29/2019   Sleep apnea    USES CPAP NIGHTLY   Past Surgical History:  Procedure Laterality Date   ABDOMINAL AORTOGRAM W/LOWER EXTREMITY N/A 10/26/2020   Procedure: ABDOMINAL AORTOGRAM  W/LOWER EXTREMITY;  Surgeon: Broadus John, MD;  Location: [place] [facility];  Service: Cardiovascular;  Laterality: N/A;   CARDIAC CATHETERIZATION     CATARACT EXTRACTION, BILATERAL     with lens implants   CHOLECYSTECTOMY N/A 03/24/2012   Procedure: LAPAROSCOPIC CHOLECYSTECTOMY WITH INTRAOPERATIVE CHOLANGIOGRAM;  Surgeon: Zenovia Jarred, MD;  Location: [place];  Service: General;  Laterality: N/A;   CORONARY ARTERY BYPASS GRAFT N/A 03/06/2012   Procedure: CORONARY ARTERY BYPASS GRAFTING (CABG);  Surgeon: Rexene Alberts, MD;  Location: [place];  Service: Open Heart Surgery;  Laterality: N/A;   ENDOVEIN HARVEST OF GREATER SAPHENOUS VEIN Right 03/06/2012   Procedure: ENDOVEIN HARVEST OF GREATER SAPHENOUS VEIN;  Surgeon: Rexene Alberts, MD;  Location: [place];  Service: Open Heart Surgery;  Laterality: Right;   INTRAOPERATIVE TRANSESOPHAGEAL ECHOCARDIOGRAM N/A 03/06/2012   Procedure: INTRAOPERATIVE TRANSESOPHAGEAL ECHOCARDIOGRAM;  Surgeon: Rexene Alberts, MD;  Location: [place];  Service: Open Heart Surgery;  Laterality: N/A;   IR KYPHO LUMBAR INC FX REDUCE BONE BX UNI/BIL CANNULATION INC/IMAGING  02/23/2020   LEFT HEART CATH Right 03/02/2012   Procedure: LEFT HEART CATH;  Surgeon: Sherren Mocha, MD;  Location: [facility] C[facility] LAB;  Service: Cardiovascular;  Laterality: Right;   MAZE N/A 03/06/2012   Procedure: MAZE;  Surgeon: Rexene Alberts, MD;  Location: [facility]  OR;  Service: Open Heart Surgery;  Laterality: N/A;   PERIPHERAL VASCULAR INTERVENTION Right 10/26/2020   Procedure: PERIPHERAL VASCULAR INTERVENTION;  Surgeon: Broadus John, MD;  Location: [place] [facility];  Service: Cardiovascular;  Laterality: Right;   PROSTATECTOMY     partial   RIGHT/LEFT HEART CATH AND CORONARY/GRAFT ANGIOGRAPHY N/A 09/29/2019   Procedure: RIGHT/LEFT HEART CATH AND CORONARY/GRAFT ANGIOGRAPHY;  Surgeon: Adrian Prows, MD;  Location: [place] [facility];  Service: Cardiovascular;  Laterality: N/A;   TEE WITHOUT CARDIOVERSION N/A  11/10/2019   Procedure: TRANSESOPHAGEAL ECHOCARDIOGRAM (TEE);  Surgeon: Burnell Blanks, MD;  Location: [place];  Service: Open Heart Surgery;  Laterality: N/A;   TRANSCATHETER AORTIC VALVE REPLACEMENT, TRANSFEMORAL  11/10/2019   Patient Active Problem List   Diagnosis Date Noted   Left middle cerebral artery stroke ([place]) 05/31/2021   Acute CVA (cerebrovascular accident) ([place]) 05/25/2021   DNR (do not resuscitate) 05/25/2021   Osteoporosis 05/05/2021   Gouty arthritis 02/13/2021   Morbid obesity ([place]) 02/13/2021   Abdominal aortic aneurysm without rupture ([place]) 11/22/2020   Acute renal failure syndrome ([place]) 11/22/2020   Low back pain 11/22/2020   Skin tear of right upper arm without complication 95/09/3265   Pressure injury of skin 10/23/2020   Urinary tract infection with hematuria 10/22/2020   Encephalopathy 10/22/2020   AKI (acute kidney injury) ([place])    Overweight (BMI 25.0-29.9) 08/19/2020   Vertebral fracture, osteoporotic ([place]) 08/18/2020   T8 vertebral fracture ([place]) 08/17/2020   Thrombocytopenia ([place]) 08/17/2020   CKD (chronic kidney disease) stage 3, GFR 30-59 ml/min (HCC) 08/17/2020   Elevated troponin 08/17/2020   Pain due to onychomycosis of toenails of both feet 07/27/2020   Callus of heel 07/27/2020   Bifascicular block 11/11/2019   Acute on chronic diastolic heart failure ([place]) 11/10/2019   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   Severe aortic stenosis 09/29/2019   Aortic valve disorder 12/45/8099   Systolic heart failure ([place]) 03/11/2019   Thrombophilia ([place]) 02/26/2019   Muscle pain 02/11/2019   Weakness 02/06/2019   Polymyalgia rheumatica ([place]) 02/06/2019   Shoulder joint pain 02/02/2019   Anemia 01/26/2019   Malignant melanoma of skin ([place]) 01/26/2019   Pain in right leg 01/26/2019   DDD (degenerative disc disease), cervical 05/28/2018   Encounter for general adult medical examination without abnormal findings 11/19/2017    Carotid artery occlusion 07/03/2017   Hearing loss 07/03/2017   Fall 06/19/2017   Injury of upper extremity 06/19/2017   Neck pain 06/19/2017   Headache 06/19/2017   Visual disturbance 06/19/2017   Ganglion of hand 05/08/2017   Dilatation of aorta ([place]) 02/14/2017   Plantar fascial fibromatosis 02/14/2017   Cardiac murmur, unspecified 02/27/2016   Benign neoplasm of colon 08/24/2015   Dementia ([place]) 08/24/2015   Influenza due to unidentified influenza virus with other respiratory manifestations 03/15/2015   Epistaxis 12/01/2014   OSA (obstructive sleep apnea) 11/18/2014   Gastro-esophageal reflux disease with esophagitis 10/29/2014   Angina pectoris ([place]) 05/04/2014   Abnormal feces 07/31/2013   Other specified disorders of gingiva and edentulous alveolar ridge 07/31/2013   Spontaneous ecchymosis 10/10/2012   Thoracic ascending aortic aneurysm ([place]) 09/29/2012   Microscopic hematuria 04/11/2012   Proteinuria 04/11/2012   S/P laparoscopic cholecystectomy 04/09/2012   S/P CABG x 3 03/06/2012   S/P Maze operation for atrial fibrillation 03/06/2012   Paroxysmal atrial fibrillation ([place]) 02/29/2012   Bitten or stung by nonvenomous insect and other nonvenomous arthropods, initial encounter 12/03/2011  Depression screening 10/17/2011   CAD (coronary artery disease), native coronary artery    Hyperlipidemia    Hypertension    Chronic venous insufficiency    Gout    Benign prostatic hyperplasia    Post-traumatic stress disorder 03/13/2011   Pain in left leg 12/08/2010   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   Fatigue 08/08/2010   Retention of urine 04/18/2010   Constipation 07/05/2009   Kidney stone 07/05/2009   ED (erectile dysfunction) of organic origin 12/14/2008   Impaired fasting glucose 12/14/2008   Vitamin B deficiency 12/14/2008    ONSET DATE: 05/25/2021  REFERRING DIAG: B15.176 (ICD-10-CM) - Left middle cerebral artery stroke   THERAPY DIAG:  Hemiplegia and  hemiparesis following cerebral infarction affecting right dominant side (HCC)  Muscle weakness (generalized)  Unsteadiness on feet  Attention and concentration deficit  Other disturbances of skin sensation  Rationale for Evaluation and Treatment Rehabilitation  SUBJECTIVE:   SUBJECTIVE STATEMENT: Pt reports that he was trying to trim some shrubs from his w/c on Saturday and now is experiencing increased pain. Pt accompanied by: self  PERTINENT HISTORY:  H/o AAA, CAD with CABG, compression fractures, chronic anemia followed by hematology services, chronic systolic congestive heart failure, A-fib/AF status post MAZE on Xarelto, hypertension, hyperlipidemia, PMR maintained on steroids, prediabetes, early dementia maintained on Namenda, chronic lower extremity wounds with Unna boot OSA with CPAP, PAD with lymphedema status post balloon angioplasty of posterior tibial artery October 2022 recent UTI placed on antibiotic therapy.  He does have a personal care attendant Monday Wednesday Friday for 3 hours.  Modified independent transfers power wheelchair bound x3 to 4 months prior to that ambulating short distances with a rollator.  Present 05/25/2021 with right-sided weakness and aphasia of acute onset.  Cranial CT scan showed small left MCA territory cortical infarct within the mid to posterior left frontal lobe. CT angiogram head and neck no large vessel occlusion.   PRECAUTIONS: Fall - multiple falls within past 6 months; 1 on 10/05/21  PAIN Are you having pain? Yes: NPRS scale: 6/10 Pain location: back and R leg Pain description: constant ache Aggravating factors: standing, certain movements/exercises Relieving factors: Tens unit, injections, laying/sitting in a comfortable position  LIVING ENVIRONMENT: Lives with: lives with their spouse Lives in: House/apartment Has following equipment at home: Environmental consultant - 2 wheeled, Environmental consultant - 4 wheeled, Wheelchair (power), Wheelchair (manual), shower chair,  bed side commode, Grab bars, and elevator to access home from outside, hand held shower head  PLOF:  Has a caregiver 3x/week that assists with ADLs, d/t spinal compression fx pt has depended more on w/c in 5-6 months  PATIENT GOALS: to talk properly; to write and to spell   OBJECTIVE:   HAND DOMINANCE: Right  ADLs: Dressing: Setup w/ UB dressing, wife assists w/ pants, socks, shoes Grooming: Mod I from seated position Eating: reports great difficulty using a knife to cut food, wife frequently cuts foods for him Toileting: Occ difficulty pulling pants back up after toileting, leans forward to complete hygiene Bathing: Supervision/setup  Tub Shower transfers: caregiver assist w/ transfers in/out of shower providing SPV and occ Min A Equipment: Shower seat with back, Grab bars, Walk in shower, bed side commode, Reacher, and Sock aid  IADLs: Financial management: Reports difficulty with writing a check, math, spelling, but did most of financial management Handwriting: 50% legible and difficulty with spelling that has gotten worse since CVA  MOBILITY STATUS: Needs Assist: Mod I stand pivot from w/c, Supervision for shower transfers  FUNCTIONAL OUTCOME MEASURES: FOTO: 58 on 09/27/21 FOTO Score: 69 on 10/18/21 -------------------------------------------------------------------------------------------------------------------------------------------------------- (objective measures above completed at initial evaluation unless otherwise dated)  TODAY'S TREATMENT:  10/23/21 Fine motor coordination: picking up variety of small items, pt with increased difficulty with small items such as beads and beans.  Pt reports that he utilizes a napkin when dispensing/picking up pills and does not notice as much difficulty.  Pt initially sliding coins to edge of table, min cues to pick up and not slide with pt able to complete with increased time.   OT increased challenge from picking up one at a time and  placing in to container to completing in-hand manipulation and translation.  Pt dropping 1/4 when translating from finger tips to palm and 2/4 when translating from palm to finger tips.   Check writing/balancing: Pt demonstrating improved legibility with check writing and check balancing activity.  Pt with only one omission of letter, one addition of letter, and no other spelling errors when writing checks from activity log, incorporating copying vs spontaneous production of writing.  Pt demonstrating improved organization and sequencing this session as well as completion of mental math with no errors.    10/18/21 FOTO: Improved score to 69 from 58.  Pt continues to report difficulty with picking up coins and putting dishes in a cabinet.   Shoulder flexion/reaching: utilized resistance Clothespins (1, 2, 4, 6#) with RUE for mod-high functional reaching and sustained pinch. Pt demonstrating good reaching technique and maintaining good positioning of RUE with reach. OT increased challenge to incorporating cognitive challenge with recall of colored sequence.  Pt able to recall sequence without additional cues.   Handwriting: When writing about his desire to improve his typing he was able to come up with 3 sentences quickly.  Pt making 4 spelling errors when completing short paragraph witting activity and demonstrating ~85% legibility largely due to errors and crossing out to attempt to correct. Pt with significant increased difficulty when attempting to write about sailing (self-proclaimed leisure task) with decreased ideation, decreased legibility (50%) and increased spelling errors.  Encouraged pt to take his time to focus on not only legibility and spelling but word production (d/t aphasia).   Coordination: picking up and placing coins in to coin slot.  Pt initially sliding coins off table, however with cues to attempt to pick up pt able to pick up with min difficulty especially with smaller coins compared  to larger.  Progressed to completing in-hand manipulation and translation.  Pt dropping 1/4 coins when translating from finger tips to palm and 0/4 when translating from palm to finger tips.  Pt able to complete with quarters and progressing to pennies with increased success.  Discussed visual attention to hand and task to compensate for impaired coordination and sensation.     10/16/21 Typing: OT used online Belle game, transitioning to [email or web address] common words as pt demonstrating decreased speed as required for Keyboard Ninja activity.  Pt reports that he has difficulty finding the correct letter on the keyboard, however OT also discussed aphasia impacting recognition of correct letter when typing.  Pt completing 8 WPM with 98% accuracy (pt types with just index fingers on both hands - therefore slowing him down).  Pt completed additional attempt  with 8 WPM and 100% accuracy. Pt with adequate accuracy due to words written on screen as he typed them, therefore transitioned to typing spoken words.  Pt demonstrating increased challenge with production of words/correct order of letters.  OT encouraged pt  to engage in typing words or even sentences/phrases to challenge production of words and correct spelling with spontaneous writing as pt reports decreased accuracy with texting as well.       PATIENT EDUCATION: Ongoing condition-specific education related to therapeutic interventions completed this session Person educated: Patient Education method: Explanation and Demonstration Education comprehension: verbalized understanding   HOME EXERCISE PROGRAM: Access Code: [email or web address] URL: [email or web address] Date: 09/11/2021 Prepared by: [facility]  Exercises - Putty Squeezes  - 1 x daily - 1 sets - 10 reps - Rolling Putty on Table  - 1 x daily - 1 sets - 10 reps - Thumb Opposition with Putty  - 1 x daily - 1 sets - 10 reps - Key Pinch with  Putty  - 1 x daily - 1 sets - 10 reps - Tip Pinch with Putty  - 1 x daily - 1 sets - 10 reps - Removing Marbles from Putty  - 1 x daily - 1 sets - 10 reps  GOALS: Goals reviewed with patient? Yes  SHORT TERM GOALS: Target date: 09/29/2021    Status:  1 Pt will verbalize understanding of adapted strategies and/or potential AE needs to increase ease, safety, and independence w/ LB dressing. Baseline: Mod A-Dep w/ LB dressing tasks Status: Pt reports still having difficulty with getting pants up/down and on/off. Will benefit from simulated practice but have not to this point due to focus on [facility] and handwriting NOT MET - 09/27/21  2 Pt will be independent with FMC/GMC and strengthening HEP to increase independence with handwriting and cutting of foods Baseline: to be administered Met - 09/27/21  3 Pt will demonstrate improved fine motor coordination for ADLs as evidenced by decreasing 9 hole peg test score for RUE by 5 secs Baseline: Right: 1:06.53 sec; Left: 34.53 sec Met - 56.15 sec    LONG TERM GOALS: Target date: 10/27/2021    Status:  1 Pt will report ability to complete LB dressing with supervision for standing balance and use of AE as needed Baseline: requires assistance Met - 10/04/21  2 Pt will demonstrated improved standing balance to stand for 1 min with alternating UE to allow for increased independence with LB dressing/clothing management post toileting Baseline:  Progressing  3 Pt will demonstrated improved working memory and recall during check writing/balancing check book by making 0 errors Baseline:  Met - 10/23/21  4 Pt will demonstrate improved fine motor coordination for ADLs as evidenced by decreasing 9 hole peg test score for RUE by 20 secs Baseline: Right: 1:06.53 sec; Left: 34.53 sec Upgraded 09/20/21 due to completing in 56.15 sec Met - 10/09/21:  43.91 sec  5 Pt will write a short paragraph with 90% legibility and no significant errors in spelling as evidenced by </=  to 3 spelling errors Baseline: to be assessed Update: partly met as Letter formation, spacing, sizing were all WNL, still making spelling errors and requiring cues 50% of time for spelling Progressing    ASSESSMENT:  CLINICAL IMPRESSION: Treatment session with focus on coordination, handwriting and word production/spelling, and working memory.  OT reviewed previously discussed impact of aphasia on his word production/spelling.  Reiterated slowing down during word production to improve legibility of handwriting and spelling accuracy.  Pt demonstrating significant improvements in legibility of handwriting, working memory, and decreased spelling errors when copying stimulus.  OT reiterated recommendation to engage in free writing and typing for spontaneous word usage to continue to assess  spelling errors/difficulties.  Discussed progress towards goals and continued focus on spelling with speech therapy.  PERFORMANCE DEFICITS in functional skills including ADLs, IADLs, coordination, sensation, ROM, strength, pain, FMC, GMC, balance, body mechanics, endurance, decreased knowledge of use of DME, and UE functional use, cognitive skills including attention, problem solving, sequencing, and thought, and psychosocial skills including coping strategies, environmental adaptation, and routines and behaviors.   IMPAIRMENTS are limiting patient from ADLs and IADLs.   COMORBIDITIES may have co-morbidities  that affects occupational performance. Patient will benefit from skilled OT to address above impairments and improve overall function.  PLAN: OT FREQUENCY: 2x/week  OT DURATION: 8 weeks  PLANNED INTERVENTIONS: self care/ADL training, therapeutic exercise, therapeutic activity, neuromuscular re-education, balance training, functional mobility training, moist heat, cryotherapy, patient/family education, cognitive remediation/compensation, psychosocial skills training, energy conservation, coping strategies  training, and DME and/or AE instructions  RECOMMENDED OTHER SERVICES: N/A  CONSULTED AND AGREED WITH PLAN OF CARE: Patient  PLAN FOR NEXT SESSION: functional reach to place items in cabinets in sitting and standing, standing tolerance with BUE functional use, and spelling with handwriting and typing.  D/C after next therapy session.  Simonne Come, OTR/L 10/23/2021, 1:47 PM

## 2021-10-24 ENCOUNTER — Inpatient Hospital Stay: Payer: Medicare Other

## 2021-10-24 ENCOUNTER — Other Ambulatory Visit: Payer: Self-pay

## 2021-10-24 ENCOUNTER — Telehealth: Payer: Self-pay

## 2021-10-24 VITALS — BP 109/51 | HR 60 | Temp 97.9°F | Resp 18

## 2021-10-24 DIAGNOSIS — D696 Thrombocytopenia, unspecified: Secondary | ICD-10-CM | POA: Diagnosis not present

## 2021-10-24 DIAGNOSIS — D631 Anemia in chronic kidney disease: Secondary | ICD-10-CM | POA: Diagnosis not present

## 2021-10-24 DIAGNOSIS — D649 Anemia, unspecified: Secondary | ICD-10-CM

## 2021-10-24 DIAGNOSIS — N189 Chronic kidney disease, unspecified: Secondary | ICD-10-CM | POA: Diagnosis not present

## 2021-10-24 DIAGNOSIS — Z79899 Other long term (current) drug therapy: Secondary | ICD-10-CM | POA: Diagnosis not present

## 2021-10-24 LAB — CBC WITH DIFFERENTIAL (CANCER CENTER ONLY)
Abs Immature Granulocytes: 0.02 10*3/uL (ref 0.00–0.07)
Basophils Absolute: 0 10*3/uL (ref 0.0–0.1)
Basophils Relative: 1 %
Eosinophils Absolute: 0.1 10*3/uL (ref 0.0–0.5)
Eosinophils Relative: 2 %
HCT: 30.8 % — ABNORMAL LOW (ref 39.0–52.0)
Hemoglobin: 9.5 g/dL — ABNORMAL LOW (ref 13.0–17.0)
Immature Granulocytes: 0 %
Lymphocytes Relative: 15 %
Lymphs Abs: 0.9 10*3/uL (ref 0.7–4.0)
MCH: 28.6 pg (ref 26.0–34.0)
MCHC: 30.8 g/dL (ref 30.0–36.0)
MCV: 92.8 fL (ref 80.0–100.0)
Monocytes Absolute: 0.7 10*3/uL (ref 0.1–1.0)
Monocytes Relative: 12 %
Neutro Abs: 4.1 10*3/uL (ref 1.7–7.7)
Neutrophils Relative %: 70 %
Platelet Count: 147 10*3/uL — ABNORMAL LOW (ref 150–400)
RBC: 3.32 MIL/uL — ABNORMAL LOW (ref 4.22–5.81)
RDW: 16.6 % — ABNORMAL HIGH (ref 11.5–15.5)
WBC Count: 5.9 10*3/uL (ref 4.0–10.5)
nRBC: 0 % (ref 0.0–0.2)

## 2021-10-24 LAB — SAMPLE TO BLOOD BANK

## 2021-10-24 MED ORDER — EPOETIN ALFA-EPBX 20000 UNIT/ML IJ SOLN
20000.0000 [IU] | Freq: Once | INTRAMUSCULAR | Status: AC
  Administered 2021-10-24: 20000 [IU] via SUBCUTANEOUS
  Filled 2021-10-24: qty 1

## 2021-10-24 NOTE — Telephone Encounter (Signed)
[name] wife of patient called stating that patient was in last week and Tramadol and Cyclobenzaprine was suppose to be called in. According to med list both was sent in on 10/19/21 at 2:31/2:32 pm. Notified [name] as such.

## 2021-10-25 ENCOUNTER — Encounter: Payer: Self-pay | Admitting: Occupational Therapy

## 2021-10-25 ENCOUNTER — Ambulatory Visit: Payer: Medicare Other | Admitting: Occupational Therapy

## 2021-10-25 ENCOUNTER — Encounter (HOSPITAL_BASED_OUTPATIENT_CLINIC_OR_DEPARTMENT_OTHER): Payer: Medicare Other | Admitting: Physician Assistant

## 2021-10-25 ENCOUNTER — Ambulatory Visit: Payer: Medicare Other

## 2021-10-25 DIAGNOSIS — I69351 Hemiplegia and hemiparesis following cerebral infarction affecting right dominant side: Secondary | ICD-10-CM

## 2021-10-25 DIAGNOSIS — R41841 Cognitive communication deficit: Secondary | ICD-10-CM | POA: Diagnosis not present

## 2021-10-25 DIAGNOSIS — R2689 Other abnormalities of gait and mobility: Secondary | ICD-10-CM | POA: Diagnosis not present

## 2021-10-25 DIAGNOSIS — R4701 Aphasia: Secondary | ICD-10-CM | POA: Diagnosis not present

## 2021-10-25 DIAGNOSIS — R41840 Attention and concentration deficit: Secondary | ICD-10-CM

## 2021-10-25 DIAGNOSIS — M6281 Muscle weakness (generalized): Secondary | ICD-10-CM | POA: Diagnosis not present

## 2021-10-25 DIAGNOSIS — R208 Other disturbances of skin sensation: Secondary | ICD-10-CM

## 2021-10-25 DIAGNOSIS — R262 Difficulty in walking, not elsewhere classified: Secondary | ICD-10-CM | POA: Diagnosis not present

## 2021-10-25 DIAGNOSIS — R2681 Unsteadiness on feet: Secondary | ICD-10-CM

## 2021-10-25 NOTE — Therapy (Signed)
OUTPATIENT PHYSICAL THERAPY NEURO TREATMENT   Patient Name: [name] [name] MRN: [id number] DOB:[birth year], 88 y.o., male Today's Date: 10/25/2021   PCP: [name], MD REFERRING PROVIDER: Charlett Blake, MD    PT End of Session - 10/25/21 1403     Visit Number 14    Number of Visits 16    Date for PT Re-Evaluation 11/07/21    Authorization Type BCBS Medicare    Authorization Time Period no VL, no auth    Progress Note Due on Visit 20    PT Start Time 1400    PT Stop Time 1445    PT Time Calculation (min) 45 min    Activity Tolerance Patient limited by pain    Behavior During Therapy [facility] for tasks assessed/performed                      Past Medical History:  Diagnosis Date   AAA (abdominal aortic aneurysm) ([place])    Arthritis    Ascending aorta dilatation ([place]) 03/13/2012   Fusiform dilatation of the ascending thoracic aorta discovered during surgery, max diameter 4.2-4.3 cm   BPH (benign prostatic hypertrophy)    Chronic systolic heart failure ([place]) 03/03/2012   Chronic venous insufficiency    GERD (gastroesophageal reflux disease)    Glaucoma    Gout    History of kidney stones    Hyperlipidemia    Hypertension    Obesity (BMI 30-39.9)    Paroxysmal atrial fibrillation ([place]) 02/29/2012   Recurrent paroxysmal, new-onset    PMR (polymyalgia rheumatica) (HCC)    Polymyalgia rheumatica ([place]) 02/06/2019   Pre-diabetes    S/P CABG x 3 03/06/2012   LIMA to LAD, SVG to OM, SVG to RCA, EVH via right thigh   S/P Maze operation for atrial fibrillation 03/06/2012   Complete bilateral lesions set using bipolar radiofrequency and cryothermy ablation with clipping of LA appendage   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   s/p TAVR with a 26 mm Edwards via the left subclavian by Drs Buena Irish and Bartle   Severe aortic stenosis 09/29/2019   Sleep apnea    USES CPAP NIGHTLY   Past Surgical History:  Procedure Laterality Date    ABDOMINAL AORTOGRAM W/LOWER EXTREMITY N/A 10/26/2020   Procedure: ABDOMINAL AORTOGRAM W/LOWER EXTREMITY;  Surgeon: Broadus John, MD;  Location: [place] [facility];  Service: Cardiovascular;  Laterality: N/A;   CARDIAC CATHETERIZATION     CATARACT EXTRACTION, BILATERAL     with lens implants   CHOLECYSTECTOMY N/A 03/24/2012   Procedure: LAPAROSCOPIC CHOLECYSTECTOMY WITH INTRAOPERATIVE CHOLANGIOGRAM;  Surgeon: Zenovia Jarred, MD;  Location: [place];  Service: General;  Laterality: N/A;   CORONARY ARTERY BYPASS GRAFT N/A 03/06/2012   Procedure: CORONARY ARTERY BYPASS GRAFTING (CABG);  Surgeon: Rexene Alberts, MD;  Location: [place];  Service: Open Heart Surgery;  Laterality: N/A;   ENDOVEIN HARVEST OF GREATER SAPHENOUS VEIN Right 03/06/2012   Procedure: ENDOVEIN HARVEST OF GREATER SAPHENOUS VEIN;  Surgeon: Rexene Alberts, MD;  Location: [place];  Service: Open Heart Surgery;  Laterality: Right;   INTRAOPERATIVE TRANSESOPHAGEAL ECHOCARDIOGRAM N/A 03/06/2012   Procedure: INTRAOPERATIVE TRANSESOPHAGEAL ECHOCARDIOGRAM;  Surgeon: Rexene Alberts, MD;  Location: [place];  Service: Open Heart Surgery;  Laterality: N/A;   IR KYPHO LUMBAR INC FX REDUCE BONE BX UNI/BIL CANNULATION INC/IMAGING  02/23/2020   LEFT HEART CATH Right 03/02/2012   Procedure: LEFT HEART CATH;  Surgeon: Sherren Mocha, MD;  Location:  [place] CATH LAB;  Service: Cardiovascular;  Laterality: Right;   MAZE N/A 03/06/2012   Procedure: MAZE;  Surgeon: Rexene Alberts, MD;  Location: [place];  Service: Open Heart Surgery;  Laterality: N/A;   PERIPHERAL VASCULAR INTERVENTION Right 10/26/2020   Procedure: PERIPHERAL VASCULAR INTERVENTION;  Surgeon: Broadus John, MD;  Location: [place] [facility];  Service: Cardiovascular;  Laterality: Right;   PROSTATECTOMY     partial   RIGHT/LEFT HEART CATH AND CORONARY/GRAFT ANGIOGRAPHY N/A 09/29/2019   Procedure: RIGHT/LEFT HEART CATH AND CORONARY/GRAFT ANGIOGRAPHY;  Surgeon: Adrian Prows, MD;  Location: [place] CV  LAB;  Service: Cardiovascular;  Laterality: N/A;   TEE WITHOUT CARDIOVERSION N/A 11/10/2019   Procedure: TRANSESOPHAGEAL ECHOCARDIOGRAM (TEE);  Surgeon: Burnell Blanks, MD;  Location: [place];  Service: Open Heart Surgery;  Laterality: N/A;   TRANSCATHETER AORTIC VALVE REPLACEMENT, TRANSFEMORAL  11/10/2019   Patient Active Problem List   Diagnosis Date Noted   Left middle cerebral artery stroke ([place]) 05/31/2021   Acute CVA (cerebrovascular accident) ([place]) 05/25/2021   DNR (do not resuscitate) 05/25/2021   Osteoporosis 05/05/2021   Gouty arthritis 02/13/2021   Morbid obesity ([place]) 02/13/2021   Abdominal aortic aneurysm without rupture ([place]) 11/22/2020   Acute renal failure syndrome ([place]) 11/22/2020   Low back pain 11/22/2020   Skin tear of right upper arm without complication 78/67/5449   Pressure injury of skin 10/23/2020   Urinary tract infection with hematuria 10/22/2020   Encephalopathy 10/22/2020   AKI (acute kidney injury) ([place])    Overweight (BMI 25.0-29.9) 08/19/2020   Vertebral fracture, osteoporotic ([place]) 08/18/2020   T8 vertebral fracture ([place]) 08/17/2020   Thrombocytopenia ([place]) 08/17/2020   CKD (chronic kidney disease) stage 3, GFR 30-59 ml/min (HCC) 08/17/2020   Elevated troponin 08/17/2020   Pain due to onychomycosis of toenails of both feet 07/27/2020   Callus of heel 07/27/2020   Bifascicular block 11/11/2019   Acute on chronic diastolic heart failure ([place]) 11/10/2019   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   Severe aortic stenosis 09/29/2019   Aortic valve disorder 20/10/710   Systolic heart failure ([place]) 03/11/2019   Thrombophilia ([place]) 02/26/2019   Muscle pain 02/11/2019   Weakness 02/06/2019   Polymyalgia rheumatica ([place]) 02/06/2019   Shoulder joint pain 02/02/2019   Anemia 01/26/2019   Malignant melanoma of skin ([place]) 01/26/2019   Pain in right leg 01/26/2019   DDD (degenerative disc disease), cervical 05/28/2018    Encounter for general adult medical examination without abnormal findings 11/19/2017   Carotid artery occlusion 07/03/2017   Hearing loss 07/03/2017   Fall 06/19/2017   Injury of upper extremity 06/19/2017   Neck pain 06/19/2017   Headache 06/19/2017   Visual disturbance 06/19/2017   Ganglion of hand 05/08/2017   Dilatation of aorta ([place]) 02/14/2017   Plantar fascial fibromatosis 02/14/2017   Cardiac murmur, unspecified 02/27/2016   Benign neoplasm of colon 08/24/2015   Dementia ([place]) 08/24/2015   Influenza due to unidentified influenza virus with other respiratory manifestations 03/15/2015   Epistaxis 12/01/2014   OSA (obstructive sleep apnea) 11/18/2014   Gastro-esophageal reflux disease with esophagitis 10/29/2014   Angina pectoris ([place]) 05/04/2014   Abnormal feces 07/31/2013   Other specified disorders of gingiva and edentulous alveolar ridge 07/31/2013   Spontaneous ecchymosis 10/10/2012   Thoracic ascending aortic aneurysm ([place]) 09/29/2012   Microscopic hematuria 04/11/2012   Proteinuria 04/11/2012   S/P laparoscopic cholecystectomy 04/09/2012   S/P CABG x 3 03/06/2012   S/P Maze  operation for atrial fibrillation 03/06/2012   Paroxysmal atrial fibrillation ([place]) 02/29/2012   Bitten or stung by nonvenomous insect and other nonvenomous arthropods, initial encounter 12/03/2011   Depression screening 10/17/2011   CAD (coronary artery disease), native coronary artery    Hyperlipidemia    Hypertension    Chronic venous insufficiency    Gout    Benign prostatic hyperplasia    Post-traumatic stress disorder 03/13/2011   Pain in left leg 12/08/2010   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   Fatigue 08/08/2010   Retention of urine 04/18/2010   Constipation 07/05/2009   Kidney stone 07/05/2009   ED (erectile dysfunction) of organic origin 12/14/2008   Impaired fasting glucose 12/14/2008   Vitamin B deficiency 12/14/2008    ONSET DATE: 05/31/21  REFERRING DIAG: U54.270  (ICD-10-CM) - Left middle cerebral artery stroke (HCC)   THERAPY DIAG:  Hemiplegia and hemiparesis following cerebral infarction affecting right dominant side (HCC)  Muscle weakness (generalized)  Unsteadiness on feet  Difficulty in walking, not elsewhere classified  Other abnormalities of gait and mobility  Rationale for Evaluation and Treatment Rehabilitation  SUBJECTIVE:                                                                                                                                                                                              SUBJECTIVE STATEMENT: Feeling about 4/10 pain in back and feeling stiff  Pt accompanied by: self  PERTINENT HISTORY: medical history significant of AAA; CAD s/p CABG; chronic systolic CHF; afib s/p Maze, on Xarelto; HTN; HLD; PMR (currently on steroids); pre-diabetes; OSA on CPAP; PAD with lymphedema; and AS s/p TAVR   PAIN:  Are you having pain? Yes: NPRS scale: 4/10 Pain location: back Pain description: sore Aggravating factors: standing/walking Relieving factors: recliner, hot pack, stretching  PRECAUTIONS: Fall, pressure sores bilateral calcaneus right > left  PATIENT GOALS be able to stand unsupported for a time to perform ADL, walking if possible  OBJECTIVE:   TODAY'S TREATMENT: 10/25/21 Activity Comments  Slideboard transfer W/c<>EOM min A w/ cues in sequence and set--up. EOM to W/C SBA-CGA  LE PRE -hip add iso 2x10 -hip flexion 2# 2x10 -seated hip abd 2x10 red+yellow loop -LAQ 2# 2x10 -seated hamstring curls 2x10 yellow t-band  Sit-stand in parallel bars 3 reps CGA, static standing x 30 sec                TODAY'S TREATMENT: 10/23/21 Activity Comments  TENS and MHP to LB x 15 min Amplitude to tolerance; 80-150 hz  Sitting horizontal abduction with yellow TB 2x10 Good form  Sitting red TB clam x15 Limited  L LE movement              PATIENT EDUCATION: Education details: answering patient's  questions on OOP PT, edu on benefits/features of Steady lift vs. Vertis Kelch; edu on MD's recommendation of AFO and pros and cons of AFO vs. Foot up brace given patient's chronic wounds; discussion on possible D/C or break from therapy to get pain levels under control before proceeding  Person educated: Patient Education method: Explanation, Demonstration, Tactile cues, Verbal cues, and Handouts Education comprehension: verbalized understanding     HOME EXERCISE PROGRAM Last updated: 10/11/21 Access Code: [email or web address] URL: [email or web address] Date: 10/11/2021 Prepared by: [facility]  Exercises - Seated Heel Raise  - 1 x daily - 5 x weekly - 2 sets - 10 reps - Seated Toe Raise  - 1 x daily - 5 x weekly - 2 sets - 10 reps - Seated March  - 1 x daily - 5 x weekly - 2 sets - 10 reps - Seated Long Arc Quad  - 1 x daily - 5 x weekly - 2 sets - 10 reps - Seated Hip Adduction Squeeze with Ball  - 1 x daily - 5 x weekly - 2 sets - 10 reps - Row with Anchored Resistance on Swiss Ball  - 1 x daily - 5 x weekly - 2 sets - 10 reps - Seated Shoulder Extension and Scapular Retraction with Resistance on Swiss Ball  - 1 x daily - 5 x weekly - 2 sets - 10 reps - Supine Lower Trunk Rotation  - 1 x daily - 5 x weekly - 2 sets - 20 reps    Below measures were taken at time of initial evaluation unless otherwise specified:   DIAGNOSTIC FINDINGS: IMPRESSION: Small areas of acute infarction in the right occipital lobe and left frontal lobe over the convexity. Mild to moderate acute infarct left operculum. Possible cerebral emboli.   Atrophy and mild chronic microvascular ischemia  COGNITION: Overall cognitive status: Within functional limits for tasks assessed Patient's historical recall is not very accurate, he reports his back injury presented x 9 months ago, whereas medical record notes encounter in 12/2019-01/2020 regarding L1 compression  fx   SENSATION: Not tested  COORDINATION: Alt. Foot taps impaired due to dorsiflexion weakness  EDEMA:  Bilateral LE, wearing compression stockings. Uses lymphedema pump  MUSCLE TONE: DNT    POSTURE: rounded shoulders  LOWER EXTREMITY ROM:     Passive  Right Eval Left Eval  Hip flexion    Hip extension    Hip abduction    Hip adduction    Hip internal rotation    Hip external rotation    Knee flexion    Knee extension    Ankle dorsiflexion -10 -10  Ankle plantarflexion    Ankle inversion    Ankle eversion     (Blank rows = not tested)  LOWER EXTREMITY MMT:    MMT Right Eval Left Eval  Hip flexion 3 3  Hip extension    Hip abduction 2+ 2+  Hip adduction 3 3  Hip internal rotation    Hip external rotation    Knee flexion 3+ 3+  Knee extension 4- 4-  Ankle dorsiflexion 0 0  Ankle plantarflexion    Ankle inversion    Ankle eversion    (Blank rows = not tested)  BED MOBILITY:  Sit to supine Min A and Mod A Supine to sit Min A Pt  notes his bed at home has assist rails  TRANSFERS: Assistive device utilized: Wheelchair (manual)  Sit to stand: CGA and Min A Stand to sit: CGA and Min A Chair to chair: CGA and Min A Floor:  DNT   W/C mobility Independent level surfaces  STAIRS:  DNT--pt has elevator to enter/exit home  GAIT: DNT due to safety concerns  FUNCTIONAL TESTs:  5 times sit to stand: unable, requires BUE and assist Static standing: dependent on BUE via RW (supported standing) Straight Leg Raise: 20+ degrees extensor lag  PATIENT SURVEYS:  FOTO to be assessed     PATIENT EDUCATION: Education details: assessment findings Person educated: Patient Education method: Explanation Education comprehension: verbalized understanding   HOME EXERCISE PROGRAM: To be initiated, demonstration/rehearsal of hip add isometric    GOALS: Goals reviewed with patient? Yes  SHORT TERM GOALS: Target date: 09/26/2021  Patient will be  independent in HEP to improve functional outcomes Baseline: Goal status: GOAL MET, 09/27/2021  2.  Patient to demonstrate sit to supine w/ supervision using adaptive equipment to reduce level of assist from caregiver Baseline: Min-mod A for BLE to clear EOB; min A with stretch out strap 10/11/21 Goal status: per report 09/27/2021-NOT MET    LONG TERM GOALS: Target date: 11/07/2021  Demonstrate improved hip abduction strength to 3+/5 to improve standing stability Baseline: 2+/5; 2+/5 L and 4- on R 10/11/21 Goal status: IN PROGRESS 10/11/21  2.  Manifest improved BLE strength as evidenced by ability to perform 5xSTS test in 30 sec using BUE Baseline: unable; 1 min 29 sec 10/11/21 Goal status: IN PROGRESS 10/11/21  3.  Demonstrate static standing balance of Fair (no UE support) x 15 sec to improve safety with ADL performance Baseline: unable, dependent on BUE (Fair-); unchanged 10/11/21 Goal status: IN PROGRESS 10/11/21  4.  Demonstrate transfers w/ set-up assist to increase independence and safety with mobility Baseline: CGA-min A; unchanged 10/11/21 Goal status: IN PROGRESS 10/11/21   ASSESSMENT:  CLINICAL IMPRESSION: Good performance and safety awareness using slideboard and notes no worsening of back pain with this transfer technique. Progressed with strength activities for isolated control and improved activity tolerance. Limited standing tolerance today of 30 sec due to back pain and generalized weakness  OBJECTIVE IMPAIRMENTS cardiopulmonary status limiting activity, decreased activity tolerance, decreased balance, decreased knowledge of use of DME, decreased mobility, difficulty walking, decreased ROM, decreased strength, impaired perceived functional ability, and pain.   ACTIVITY LIMITATIONS lifting, bending, standing, squatting, transfers, bed mobility, toileting, dressing, and locomotion level  PARTICIPATION LIMITATIONS: meal prep, cleaning, community activity, yard work,  and home mgmt  PERSONAL FACTORS Age, Time since onset of injury/illness/exacerbation, and 3+ comorbidities: see PMH  are also affecting patient's functional outcome.   REHAB POTENTIAL: Good  CLINICAL DECISION MAKING: Evolving/moderate complexity  EVALUATION COMPLEXITY: Moderate  PLAN: PT FREQUENCY: 2x/week  PT DURATION: 8 weeks  PLANNED INTERVENTIONS: Therapeutic exercises, Therapeutic activity, Neuromuscular re-education, Balance training, Gait training, Patient/Family education, Self Care, Joint mobilization, Stair training, Vestibular training, Canalith repositioning, Orthotic/Fit training, DME instructions, Aquatic Therapy, Dry Needling, Electrical stimulation, Wheelchair mobility training, Spinal mobilization, Cryotherapy, Moist heat, Taping, Ionotophoresis 4mg /ml Dexamethasone, and Manual therapy  PLAN FOR NEXT SESSION: probable DC; Transfers/static standing/gait(?) with bilateral platform RW, trial with foot-up cuff?   3:38 PM, 10/25/21 M. Sherlyn Lees, PT, DPT Physical Therapist- [place] Office Number: [phone]   [place] [facility] [address], [place], [place] [address] Phone # [phone] Fax # [phone]

## 2021-10-25 NOTE — Therapy (Signed)
OUTPATIENT OCCUPATIONAL THERAPY  TREATMENT NOTE & DISCHARGE SUMMARY  Patient Name: [name] [name] MRN: [id number] DOB:[birth year], 88 y.o., male Today's Date: 10/25/2021  PCP: [name], MD REFERRING PROVIDER: Charlett Blake, MD    OT End of Session - 10/25/21 1322     Visit Number 15    Number of Visits 17    Date for OT Re-Evaluation 10/27/21    Authorization Type [employer] Medicare    OT Start Time 4462    OT Stop Time 1358    OT Time Calculation (min) 41 min    Activity Tolerance Patient tolerated treatment well    Behavior During Therapy WFL for tasks assessed/performed            OCCUPATIONAL THERAPY DISCHARGE SUMMARY  Visits from Start of Care: 15  Current functional level related to goals / functional outcomes: See goals below for detail; setup for UB dressing and bathing, Mod A-Dep for LB dressing (socks/shoes), shower transfers and toileting, but able to demonstrate donning/doffing pants w/ SPV   Remaining deficits: Decreased FMC, functional mobility, overall activity tolerance, strength, and higher level executive functioning; aphasia; and deficits w/ participation and independence in ADLs and IADLs   Education / Equipment: Condition-specific education; compensatory strategies, including AE (reacher, long-handle shoe horn, etc.); energy conservation; HEP  Patient agrees to discharge. Patient goals were partially met. Patient is being discharged due to maximized rehab potential.    Past Medical History:  Diagnosis Date   AAA (abdominal aortic aneurysm) ([place])    Arthritis    Ascending aorta dilatation ([place]) 03/13/2012   Fusiform dilatation of the ascending thoracic aorta discovered during surgery, max diameter 4.2-4.3 cm   BPH (benign prostatic hypertrophy)    Chronic systolic heart failure ([place]) 03/03/2012   Chronic venous insufficiency    GERD (gastroesophageal reflux disease)    Glaucoma    Gout    History of kidney stones    Hyperlipidemia     Hypertension    Obesity (BMI 30-39.9)    Paroxysmal atrial fibrillation ([place]) 02/29/2012   Recurrent paroxysmal, new-onset    PMR (polymyalgia rheumatica) (HCC)    Polymyalgia rheumatica ([place]) 02/06/2019   Pre-diabetes    S/P CABG x 3 03/06/2012   LIMA to LAD, SVG to OM, SVG to RCA, EVH via right thigh   S/P Maze operation for atrial fibrillation 03/06/2012   Complete bilateral lesions set using bipolar radiofrequency and cryothermy ablation with clipping of LA appendage   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   s/p TAVR with a 26 mm Edwards via the left subclavian by Drs Buena Irish and Bartle   Severe aortic stenosis 09/29/2019   Sleep apnea    USES CPAP NIGHTLY   Past Surgical History:  Procedure Laterality Date   ABDOMINAL AORTOGRAM W/LOWER EXTREMITY N/A 10/26/2020   Procedure: ABDOMINAL AORTOGRAM W/LOWER EXTREMITY;  Surgeon: Broadus John, MD;  Location: [facility];  Service: Cardiovascular;  Laterality: N/A;   CARDIAC CATHETERIZATION     CATARACT EXTRACTION, BILATERAL     with lens implants   CHOLECYSTECTOMY N/A 03/24/2012   Procedure: LAPAROSCOPIC CHOLECYSTECTOMY WITH INTRAOPERATIVE CHOLANGIOGRAM;  Surgeon: Zenovia Jarred, MD;  Location: [facility];  Service: General;  Laterality: N/A;   CORONARY ARTERY BYPASS GRAFT N/A 03/06/2012   Procedure: CORONARY ARTERY BYPASS GRAFTING (CABG);  Surgeon: Rexene Alberts, MD;  Location: [facility];  Service: Open Heart Surgery;  Laterality: N/A;   ENDOVEIN HARVEST OF GREATER SAPHENOUS VEIN Right 03/06/2012  Procedure: ENDOVEIN HARVEST OF GREATER SAPHENOUS VEIN;  Surgeon: Rexene Alberts, MD;  Location: [facility];  Service: Open Heart Surgery;  Laterality: Right;   INTRAOPERATIVE TRANSESOPHAGEAL ECHOCARDIOGRAM N/A 03/06/2012   Procedure: INTRAOPERATIVE TRANSESOPHAGEAL ECHOCARDIOGRAM;  Surgeon: Rexene Alberts, MD;  Location: [facility];  Service: Open Heart Surgery;  Laterality: N/A;   IR KYPHO LUMBAR INC FX REDUCE BONE BX UNI/BIL  CANNULATION INC/IMAGING  02/23/2020   LEFT HEART CATH Right 03/02/2012   Procedure: LEFT HEART CATH;  Surgeon: Sherren Mocha, MD;  Location: [facility];  Service: Cardiovascular;  Laterality: Right;   MAZE N/A 03/06/2012   Procedure: MAZE;  Surgeon: Rexene Alberts, MD;  Location: [facility];  Service: Open Heart Surgery;  Laterality: N/A;   PERIPHERAL VASCULAR INTERVENTION Right 10/26/2020   Procedure: PERIPHERAL VASCULAR INTERVENTION;  Surgeon: Broadus John, MD;  Location: [facility];  Service: Cardiovascular;  Laterality: Right;   PROSTATECTOMY     partial   RIGHT/LEFT HEART CATH AND CORONARY/GRAFT ANGIOGRAPHY N/A 09/29/2019   Procedure: RIGHT/LEFT HEART CATH AND CORONARY/GRAFT ANGIOGRAPHY;  Surgeon: Adrian Prows, MD;  Location: [facility];  Service: Cardiovascular;  Laterality: N/A;   TEE WITHOUT CARDIOVERSION N/A 11/10/2019   Procedure: TRANSESOPHAGEAL ECHOCARDIOGRAM (TEE);  Surgeon: Burnell Blanks, MD;  Location: [facility];  Service: Open Heart Surgery;  Laterality: N/A;   TRANSCATHETER AORTIC VALVE REPLACEMENT, TRANSFEMORAL  11/10/2019   Patient Active Problem List   Diagnosis Date Noted   Left middle cerebral artery stroke ([place]) 05/31/2021   Acute CVA (cerebrovascular accident) ([place]) 05/25/2021   DNR (do not resuscitate) 05/25/2021   Osteoporosis 05/05/2021   Gouty arthritis 02/13/2021   Morbid obesity ([place]) 02/13/2021   Abdominal aortic aneurysm without rupture ([place]) 11/22/2020   Acute renal failure syndrome ([place]) 11/22/2020   Low back pain 11/22/2020   Skin tear of right upper arm without complication 16/10/9602   Pressure injury of skin 10/23/2020   Urinary tract infection with hematuria 10/22/2020   Encephalopathy 10/22/2020   AKI (acute kidney injury) ([place])    Overweight (BMI 25.0-29.9) 08/19/2020   Vertebral fracture, osteoporotic ([place]) 08/18/2020   T8 vertebral fracture ([place]) 08/17/2020   Thrombocytopenia ([place]) 08/17/2020   CKD (chronic kidney disease)  stage 3, GFR 30-59 ml/min (HCC) 08/17/2020   Elevated troponin 08/17/2020   Pain due to onychomycosis of toenails of both feet 07/27/2020   Callus of heel 07/27/2020   Bifascicular block 11/11/2019   Acute on chronic diastolic heart failure ([place]) 11/10/2019   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   Severe aortic stenosis 09/29/2019   Aortic valve disorder 54/09/8117   Systolic heart failure ([place]) 03/11/2019   Thrombophilia ([place]) 02/26/2019   Muscle pain 02/11/2019   Weakness 02/06/2019   Polymyalgia rheumatica ([place]) 02/06/2019   Shoulder joint pain 02/02/2019   Anemia 01/26/2019   Malignant melanoma of skin ([place]) 01/26/2019   Pain in right leg 01/26/2019   DDD (degenerative disc disease), cervical 05/28/2018   Encounter for general adult medical examination without abnormal findings 11/19/2017   Carotid artery occlusion 07/03/2017   Hearing loss 07/03/2017   Fall 06/19/2017   Injury of upper extremity 06/19/2017   Neck pain 06/19/2017   Headache 06/19/2017   Visual disturbance 06/19/2017   Ganglion of hand 05/08/2017   Dilatation of aorta ([place]) 02/14/2017   Plantar fascial fibromatosis 02/14/2017   Cardiac murmur, unspecified 02/27/2016   Benign neoplasm of colon 08/24/2015   Dementia ([place]) 08/24/2015   Influenza  due to unidentified influenza virus with other respiratory manifestations 03/15/2015   Epistaxis 12/01/2014   OSA (obstructive sleep apnea) 11/18/2014   Gastro-esophageal reflux disease with esophagitis 10/29/2014   Angina pectoris ([place]) 05/04/2014   Abnormal feces 07/31/2013   Other specified disorders of gingiva and edentulous alveolar ridge 07/31/2013   Spontaneous ecchymosis 10/10/2012   Thoracic ascending aortic aneurysm ([place]) 09/29/2012   Microscopic hematuria 04/11/2012   Proteinuria 04/11/2012   S/P laparoscopic cholecystectomy 04/09/2012   S/P CABG x 3 03/06/2012   S/P Maze operation for atrial fibrillation 03/06/2012    Paroxysmal atrial fibrillation ([place]) 02/29/2012   Bitten or stung by nonvenomous insect and other nonvenomous arthropods, initial encounter 12/03/2011   Depression screening 10/17/2011   CAD (coronary artery disease), native coronary artery    Hyperlipidemia    Hypertension    Chronic venous insufficiency    Gout    Benign prostatic hyperplasia    Post-traumatic stress disorder 03/13/2011   Pain in left leg 12/08/2010   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   Fatigue 08/08/2010   Retention of urine 04/18/2010   Constipation 07/05/2009   Kidney stone 07/05/2009   ED (erectile dysfunction) of organic origin 12/14/2008   Impaired fasting glucose 12/14/2008   Vitamin B deficiency 12/14/2008    ONSET DATE: 05/25/2021  REFERRING DIAG: N47.096 (ICD-10-CM) - Left middle cerebral artery stroke   THERAPY DIAG:  Hemiplegia and hemiparesis following cerebral infarction affecting right dominant side (HCC)  Muscle weakness (generalized)  Unsteadiness on feet  Attention and concentration deficit  Other disturbances of skin sensation  Rationale for Evaluation and Treatment Rehabilitation  SUBJECTIVE:   SUBJECTIVE STATEMENT: "I feel that I can continue on my own" Pt accompanied by: self  PERTINENT HISTORY: h/o AAA, CAD with CABG, compression fractures, chronic anemia followed by hematology services, chronic systolic congestive heart failure, A-fib/AF status post MAZE on Xarelto, hypertension, hyperlipidemia, PMR maintained on steroids, prediabetes, early dementia maintained on Namenda, chronic lower extremity wounds with Unna boot OSA with CPAP, PAD with lymphedema status post balloon angioplasty of posterior tibial artery October 2022 recent UTI placed on antibiotic therapy. He does have a personal care attendant Monday Wednesday Friday for 3 hours. Modified independent transfers power wheelchair bound x3 to 4 months prior to that ambulating short distances with a rollator. Present 05/25/2021  with right-sided weakness and aphasia of acute onset. Cranial CT scan showed small left MCA territory cortical infarct within the mid to posterior left frontal lobe. CT angiogram head and neck no large vessel occlusion.   PRECAUTIONS: Fall - multiple falls within past 6 months; 1 on 10/05/21  PAIN Are you having pain? Yes: NPRS scale: 4/10 Pain location: back and R leg Pain description: constant ache Aggravating factors: standing, certain movements/exercises Relieving factors: TENS unit, injections, laying/sitting in a comfortable position  LIVING ENVIRONMENT: Lives with: lives with their spouse Lives in: House/apartment Has following equipment at home: Environmental consultant - 2 wheeled, Environmental consultant - 4 wheeled, Wheelchair (power), Wheelchair (manual), shower chair, bed side commode, Grab bars, and elevator to access home from outside, hand held shower head  PLOF:  Has a caregiver 3x/week that assists with ADLs, d/t spinal compression fx pt has depended more on w/c in 5-6 months  PATIENT GOALS: to talk properly; to write and to spell   OBJECTIVE:   HAND DOMINANCE: Right   TODAY'S TREATMENT:  10/25/21 ADLs/IADLs: Discussed participation in specific ADLs (LB dressing, functional transfers, and bathing), reviewing all previously discussed education regarding compensatory strategies and AE (e.g.,  use of reacher for retrieving items and for LB dressing, long-handle shoe horn and shoe funnel, long-handle sponge, leg lifters, etc.). OT again encouraged pt continue trying to participate in low level IADL tasks, working up to higher level tasks as able; education provided on "use it or lose it" to facilitate the body's ability to adapt and maintain function w/ consistent engagement and practice in activities. Functional Transfers: Completed transfer from w/c <> elevated EOM (positioned at height simulating bed transfer) w/ pt able to complete using UE support and min guard for safety. Unable to transition to full  upright standing and unable to complete transfer w/out UE support; pt does have rails for UE support when completing at home. Handwriting: Practiced writing a 'thank you' letter w/ pt able to complete w/out spelling errors and occ difficulty w/ ideation/word finding. Then completed 4-sentence paragraph, writing sentences verbally provided by OT w/ 80% legibility and 4 spelling errors.   PATIENT EDUCATION: See tx section above; Reviewed full HEP and provided education regarding importance of maintenance post-discharge w/ pt verbalizing understanding Person educated: Patient Education method: Explanation and Demonstration Education comprehension: verbalized understanding   HOME EXERCISE PROGRAM: Access Code: [email or web address] URL: [email or web address] Date: 09/11/2021 Prepared by: [facility]  Exercises - Putty Squeezes  - 1 x daily - 1 sets - 10 reps - Rolling Putty on Table  - 1 x daily - 1 sets - 10 reps - Thumb Opposition with Putty  - 1 x daily - 1 sets - 10 reps - Key Pinch with Putty  - 1 x daily - 1 sets - 10 reps - Tip Pinch with Putty  - 1 x daily - 1 sets - 10 reps - Removing Marbles from Putty  - 1 x daily - 1 sets - 10 reps  GOALS: Goals reviewed with patient? Yes  SHORT TERM GOALS: Target date: 09/29/2021    Status:  1 Pt will verbalize understanding of adapted strategies and/or potential AE needs to increase ease, safety, and independence w/ LB dressing. Baseline: Mod A-Dep w/ LB dressing tasks Status: Pt reports still having difficulty with getting pants up/down and on/off. Will benefit from simulated practice but have not to this point due to focus on [facility] and handwriting MET - 10/25/21  2 Pt will be independent with FMC/GMC and strengthening HEP to increase independence with handwriting and cutting of foods Baseline: to be administered Met - 09/27/21  3 Pt will demonstrate improved fine motor coordination for ADLs as  evidenced by decreasing 9 hole peg test score for RUE by 5 secs Baseline: Right: 1:06.53 sec; Left: 34.53 sec Met - 56.15 sec    LONG TERM GOALS: Target date: 10/27/2021    Status:  1 Pt will report ability to complete LB dressing with supervision for standing balance and use of AE as needed Baseline: requires assistance Met - 10/04/21  2 Pt will demonstrated improved standing balance to stand for 1 min with alternating UE to allow for increased independence with LB dressing/clothing management post toileting Baseline:  Not met - 10/25/21  3 Pt will demonstrated improved working memory and recall during check writing/balancing check book by making 0 errors Baseline:  Met - 10/23/21  4 Pt will demonstrate improved fine motor coordination for ADLs as evidenced by decreasing 9 hole peg test score for RUE by 20 secs Baseline: Right: 1:06.53 sec; Left: 34.53 sec Upgraded 09/20/21 due to completing in 56.15 sec Met - 10/09/21:  43.91  sec  5 Pt will write a short paragraph with 90% legibility and no significant errors in spelling as evidenced by </= to 3 spelling errors Baseline: to be assessed Update: partly met as Letter formation, spacing, sizing were all WNL, still making spelling errors and requiring cues 50% of time for spelling Met - 10/25/21    ASSESSMENT:  CLINICAL IMPRESSION: [name] [name] is a 88 y/o who has been seen in OP OT for RUE weakness and decreased participation in ADLs and IADLs s/p L MCA CVA in May of 2023. Pt has multiple comorbidities that impact participation in daily activities and carryover to home was a limiting factor in maximizing benefit of therapy. Pt has shown improvements in areas addressed in therapy w/ all STGs and 4/5 LTGs met. Pt is appropriate for d/c from skilled OT to HEP at this time, reports he is comfortable with progress, and is currently agreeable to discharge plan, verbalizing understanding of importance of maintaining HEP and incorporation of learned  strategies for maximizing recovery. Pt encouraged to contact his physician for a new order to return to therapy related to any new or worsening concerns or independence w/ ADLs.   PLAN: OT FREQUENCY: 2x/week  OT DURATION: 8 weeks  PLANNED INTERVENTIONS: self care/ADL training, therapeutic exercise, therapeutic activity, neuromuscular re-education, balance training, functional mobility training, moist heat, cryotherapy, patient/family education, cognitive remediation/compensation, psychosocial skills training, energy conservation, coping strategies training, and DME and/or AE instructions  RECOMMENDED OTHER SERVICES: N/A  CONSULTED AND AGREED WITH PLAN OF CARE: Patient  PLAN FOR NEXT SESSION: D/C   Kathrine Cords, OTR/L 10/25/2021, 2:03 PM

## 2021-11-01 ENCOUNTER — Encounter (HOSPITAL_BASED_OUTPATIENT_CLINIC_OR_DEPARTMENT_OTHER): Payer: Medicare Other | Attending: Physician Assistant | Admitting: Physician Assistant

## 2021-11-01 DIAGNOSIS — L97312 Non-pressure chronic ulcer of right ankle with fat layer exposed: Secondary | ICD-10-CM | POA: Insufficient documentation

## 2021-11-01 DIAGNOSIS — L89613 Pressure ulcer of right heel, stage 3: Secondary | ICD-10-CM | POA: Diagnosis not present

## 2021-11-01 DIAGNOSIS — I890 Lymphedema, not elsewhere classified: Secondary | ICD-10-CM | POA: Diagnosis not present

## 2021-11-01 DIAGNOSIS — G629 Polyneuropathy, unspecified: Secondary | ICD-10-CM | POA: Diagnosis not present

## 2021-11-01 DIAGNOSIS — F0390 Unspecified dementia without behavioral disturbance: Secondary | ICD-10-CM | POA: Diagnosis not present

## 2021-11-01 DIAGNOSIS — I132 Hypertensive heart and chronic kidney disease with heart failure and with stage 5 chronic kidney disease, or end stage renal disease: Secondary | ICD-10-CM | POA: Insufficient documentation

## 2021-11-01 DIAGNOSIS — I509 Heart failure, unspecified: Secondary | ICD-10-CM | POA: Insufficient documentation

## 2021-11-01 DIAGNOSIS — L97512 Non-pressure chronic ulcer of other part of right foot with fat layer exposed: Secondary | ICD-10-CM | POA: Insufficient documentation

## 2021-11-01 DIAGNOSIS — I2510 Atherosclerotic heart disease of native coronary artery without angina pectoris: Secondary | ICD-10-CM | POA: Insufficient documentation

## 2021-11-01 DIAGNOSIS — N186 End stage renal disease: Secondary | ICD-10-CM | POA: Diagnosis not present

## 2021-11-01 DIAGNOSIS — I872 Venous insufficiency (chronic) (peripheral): Secondary | ICD-10-CM | POA: Insufficient documentation

## 2021-11-01 DIAGNOSIS — L97522 Non-pressure chronic ulcer of other part of left foot with fat layer exposed: Secondary | ICD-10-CM | POA: Insufficient documentation

## 2021-11-01 DIAGNOSIS — I739 Peripheral vascular disease, unspecified: Secondary | ICD-10-CM | POA: Diagnosis not present

## 2021-11-01 DIAGNOSIS — S71111A Laceration without foreign body, right thigh, initial encounter: Secondary | ICD-10-CM | POA: Insufficient documentation

## 2021-11-01 DIAGNOSIS — L89153 Pressure ulcer of sacral region, stage 3: Secondary | ICD-10-CM | POA: Diagnosis not present

## 2021-11-01 DIAGNOSIS — L97822 Non-pressure chronic ulcer of other part of left lower leg with fat layer exposed: Secondary | ICD-10-CM | POA: Diagnosis not present

## 2021-11-01 DIAGNOSIS — M109 Gout, unspecified: Secondary | ICD-10-CM | POA: Diagnosis not present

## 2021-11-01 NOTE — Progress Notes (Addendum)
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 Instructions: Apply to wound bed as instructed Secondary Dressing: ALLEVYN Heel 4 1/2in x 5 1/2in / 10.5cm x 13.5cm (Generic) 3 x Per Week/30 Days Discharge Instructions: Apply over primary dressing as directed. Secondary Dressing: Woven Gauze Sponge, Non-Sterile 4x4 in (Generic) 3 x Per Week/30 Days Discharge Instructions: ***PLEASE SEND BULK.***Apply over primary dressing as directed. Secured With: [employer], 4.5x3.1 (in/yd) (Generic) 3 x Per Week/30 Days Discharge Instructions: Secure with Kerlix as directed. Wound #20 - T Second oe Wound Laterality: Left Cleanser: Soap and Water 1 x Per Day/30 Days Discharge Instructions: May shower and wash wound with dial antibacterial soap and water prior to dressing change. Cleanser: Wound Cleanser 1 x Per Day/30 Days Discharge Instructions: Cleanse the wound with wound cleanser prior to applying a clean dressing using gauze sponges, not tissue or cotton balls. Prim Dressing: Xeroform Occlusive Gauze Dressing, 4x4 in 1 x Per  Day/30 Days ary Discharge Instructions: Apply to wound bed as instructed [name] [name], [name] [name] ([id number]) [id number] Page 6 of 13 Secondary  Dressing: Woven Gauze Sponges 2x2 in 1 x Per Day/30 Days Discharge Instructions: Apply over primary dressing as directed. Secured With: Stretch Net Size 2, 10 (yds) (Generic) 1 x Per Day/30 Days Wound #21 - T Second oe Wound Laterality: Right Cleanser: Soap and Water 1 x Per Day/30 Days Discharge Instructions: May shower and wash wound with dial antibacterial soap and water prior to dressing change. Cleanser: Wound Cleanser 1 x Per Day/30 Days Discharge Instructions: Cleanse the wound with wound cleanser prior to applying a clean dressing using gauze sponges, not tissue or cotton balls. Prim Dressing: Xeroform Occlusive Gauze Dressing, 4x4 in 1 x Per Day/30 Days ary Discharge Instructions: Apply to wound bed as instructed Secondary Dressing: Woven Gauze Sponges 2x2 in 1 x Per Day/30 Days Discharge Instructions: Apply over primary dressing as directed. Secured With: Stretch Net Size 2, 10 (yds) (Generic) 1 x Per Day/30 Days Wound #22 - Achilles Wound Laterality: Right Cleanser: Soap and Water 3 x Per Week/30 Days Discharge Instructions: May shower and wash wound with dial antibacterial soap and water prior to dressing change. Cleanser: Wound Cleanser 3 x Per Week/30 Days Discharge Instructions: Cleanse the wound with wound cleanser prior to applying a clean dressing using gauze sponges, not tissue or cotton balls. Prim Dressing: Xeroform Occlusive Gauze Dressing, 4x4 in 3 x Per Week/30 Days ary Discharge Instructions: Apply to wound bed as instructed Secondary Dressing: ABD Pad, 5x9 3 x Per Week/30 Days Discharge Instructions: Apply over primary dressing as directed. Secured With: Coban Self-Adherent Wrap 4x5 (in/yd) 3 x Per Week/30 Days Discharge Instructions: Secure with Coban as directed. Secured With: [employer],  4.5x3.1 (in/yd) (Generic) 3 x Per Week/30 Days Discharge Instructions: Secure with Kerlix as directed. Wound #23 - Lower Leg Wound Laterality: Right, Anterior Cleanser: Soap and Water 3 x Per Week/30 Days Discharge Instructions: May shower and wash wound with dial antibacterial soap and water prior to dressing change. Cleanser: Wound Cleanser 3 x Per Week/30 Days Discharge Instructions: Cleanse the wound with wound cleanser prior to applying a clean dressing using gauze sponges, not tissue or cotton balls. Prim Dressing: Xeroform Occlusive Gauze Dressing, 4x4 in 3 x Per Week/30 Days ary Discharge Instructions: Apply to wound bed as instructed Secondary Dressing: ABD Pad, 5x9 3 x Per Week/30 Days Discharge Instructions: Apply over primary dressing as directed. Secured With: Coban Self-Adherent Wrap 4x5 (in/yd) 3 x Per Week/30 Days Discharge Instructions: Secure with Coban as directed. Secured With: [employer], 4.5x3.1 (in/yd) (Generic) 3 x Per Week/30 Days Discharge Instructions: Secure with Kerlix as directed. Electronic Signature(s) Signed: 11/01/2021 6:14:51 PM By: [name] Keeler PA-C Signed: 11/03/2021 5:25:29 PM By: [name] Pilling RN, [name] Entered By: [name] [name] on 11/01/2021 15:55:34 Problem List Details -------------------------------------------------------------------------------- [name] [name] ([id number]) [id number] Page 7 of 13 Patient Name: Date of Service: [name] [name], [name] [name] ID [name]. 11/01/2021 2:30 PM Medical Record Number: [id number] Patient Account Number: [id number] Date of Birth/Sex: Treating RN: [birth year] (88 y.o. M) Primary Care Provider: Jerlyn [name] Other Clinician: Referring Provider: Treating Provider/Extender: [name] [name] in Treatment: 69 Active Problems ICD-10 Encounter Code Description Active Date MDM Diagnosis L89.613 Pressure ulcer of right heel, stage 3 09/21/2020 No Yes I73.89 Other specified  peripheral vascular diseases 09/21/2020 No Yes I87.2 Venous insufficiency (chronic) (peripheral) 11/16/2020 No Yes L97.312 Non-pressure chronic ulcer of right ankle with fat layer exposed 11/16/2020 No Yes L97.822 Non-pressure chronic ulcer of other part of left lower leg with fat  layer exposed11/16/2022 No Yes L89.153 Pressure ulcer of sacral region, stage 3 03/22/2021 No Yes S71.111D Laceration without foreign body, right thigh, subsequent encounter 05/03/2021 No Yes T81.31XA Disruption of external operation (surgical) wound, not elsewhere classified, 07/05/2021 No Yes initial encounter L97.512 Non-pressure chronic ulcer of other part of right foot with fat layer exposed 10/04/2021 No Yes L97.522 Non-pressure chronic ulcer of other part of left foot with fat layer exposed 10/04/2021 No Yes N40.1 Benign prostatic hyperplasia with lower urinary tract symptoms 09/21/2020 No Yes N18.30 Chronic kidney disease, stage 3 unspecified 09/21/2020 No Yes I10 Essential (primary) hypertension 09/21/2020 No Yes Inactive Problems Resolved Problems ICD-10 Code Description Active Date Resolved Date L98.8 Other specified disorders of the skin and subcutaneous tissue 04/05/2021 04/05/2021 [name] [name] ([id number]) [phone].pdf Page 8 of 13 S41.111D Laceration without foreign body of right upper arm, subsequent encounter 05/03/2021 05/03/2021 S41.112A Laceration without foreign body of left upper arm, initial encounter 05/10/2021 05/10/2021 Electronic Signature(s) Signed: 11/01/2021 2:23:04 PM By: [name] Keeler PA-C Entered By: [name] [name] on 11/01/2021 14:23:04 -------------------------------------------------------------------------------- Progress Note Details Patient Name: Date of Service: [name] [name], [name] [name]. 11/01/2021 2:30 PM Medical Record Number: [id number] Patient Account Number: [id number] Date of Birth/Sex: Treating RN: [birth year] (88 y.o. M) Primary Care Provider: Jerlyn [name] Other  Clinician: Referring Provider: Treating Provider/Extender: [name] [name] in Treatment: 17 Subjective Chief Complaint Information obtained from Patient Multiple ulcers noted over the upper and lower extremities History of Present Illness (HPI) 09/21/2020 upon evaluation today patient appears to be doing somewhat poorly in regard to a wound in the right lateral heel location. This fortunately does not appear to be too bad but nonetheless is definitely a spot that we need to work on try to get healed for him. Has been here for quite some time. Fortunately there does not appear to be any evidence of active infection at this time which is great news. With that being said I do believe that he has some evidence here of peripheral vascular disease this is minimal his ABI 0.87. Other than that he does have a history of BPH, chronic kidney disease stage III, and hypertension. Currently he has been using different medication such as Silvadene or antibiotic ointment to try to help treat things. This just is not helping out as efficiently as it should be. Of note patient's history includes that of a triple bypass in 2010, a kyphoplasty at L1 which was done 8 months ago, unfortunately he had a fracture 2 months ago which she sustained a T7/T8 fracture to his spine which ended up with him being in the hospital. It is in that time since then that he developed this wound roughly 2 to 3 months ago is not exactly sure when. He does typically wear compression socks in the 20 to 30 mmHg range. 09/28/2020 upon evaluation today patient presents for follow-up and overall seems to be doing more poorly based on what [name] am seeing currently. His ABI last time was measured at 0.87 but [name] am beginning to think this may be falsely elevated due to the fact that there is more eschar and subsequently a concern here of vascular compromise that may be part of her issue. [name] think that we need to avoid any  additional sharp debridement and make a referral to vascular surgery as soon as possible here. Subsequently also went ahead and did discuss the possibility of starting the patient on Santyl instead of the collagen  in order to help clean up the surface of the wound this will take the place of sharp debridement currently. 10/19/2020 upon evaluation today patient's wound actually appears to be doing okay although he has an area on the medial aspect of his heel that is only been showing up for the past couple of days this is more of a blister that has ruptured. The good news is there is no deep wound here which is good. Fortunately there is no signs of active infection at this time also good news. With that being said however the blood flow was not good with an ABI of 0.67 on the right it was around 1 on the left nonetheless [name] think he does need to see vascular we put that referral in last week on the 12th Dr. Dellia Nims reviewed that for me and made that recommendations. Nonetheless the patient has not heard anything from vascular yet as far as getting this scheduled. [name] think this needs to be done sooner rather than later as [name] am concerned about the possibility of a limb threatening situation if we do not get this under control.. 11/16/2020 upon evaluation today patient presents for follow-up regarding issues that he has been having with his bilateral lower extremities in particular. He in fact still has an issue with his right heel. This appears to be a stage III pressure ulcer at this point there is some slough noted. Subsequently he does have revascularization that is taking place on the right leg as a follow-up later this month in that regard. No repeat ABIs. Subsequently the patient also has venous stasis bilaterally which is also problematic for him at this time. He has some ulcers on the left ankle and left leg that are getting need to be addressed as well. 12/21/2020 upon evaluation today patient  appears to be doing poorly in regard to his bilateral lower extremity ulcers. He still has areas on both lower extremities currently ineffective even some areas that were not there previous. Fortunately there is no signs of active infection at this time. No fevers, chills, nausea, vomiting, or diarrhea. 01/04/21 upon evaluation today patient actually appears to be doing quite well in regard to his wounds. Fortunately there does not appear to be any signs of active infection which is great. There is some necrotic tissue that [name] think would be helpful to debride away pretty much on all wounds other than the heel which appears to be doing quite well. 01/25/2021 upon evaluation today patient appears to be doing okay in regard to his wounds. There are some measurements that are definitely smaller which is good news. Fortunately I do not see any evidence of active infection locally nor systemically at this point which is also great news. He does have a deep tissue injury which seems to be getting a bit worse on the lateral portion of his right heel. He tells me is not been using the bunny boots and he has not been using a pillow under his calves either. Nonetheless [name] think this is absolutely a pressure injury and [name] am very concerned about this. Also I do not want this to get a lot worse. He also tells me his hemoglobin is around 7 he is going to be seeing hematology for a transfusion either today or tomorrow most likely this will definitely help with his healing. 02/01/2021 upon evaluation today patient actually appears to be making excellent progress in regard to his wound currently. Fortunately [name] feel like that all the  wounds are showing signs of improvement the Iodoflex seems to be doing an awesome job. [name] am very pleased with where we stand today. There does not appear to be any signs of active infection. [name] [name], [name] [name] ([id number]) [id number] Page 9 of 13 02/22/2021 upon evaluation  today patient appears to be doing well with regard to his wound. He has been tolerating the dressing changes without complication. Fortunately there does not appear to be any evidence of active infection locally or systemically at this point which is good news. Overall [name] think that we are headed in the right direction and the Iodoflex is doing a good job. 03/08/2021 upon evaluation today patient appears to be doing well all things considered with regard to his wounds. He has been tolerating the dressing changes without complication. Fortunately there does not appear to be any evidence of active infection locally nor systemically which is great news overall [name] think the Iodosorb is doing an awesome job here. 03/22/2021 upon evaluation today patient appears to be doing well with regard to his wounds for the most part. The biggest issue is that he does have a new wound on his gluteal region really more sacral than anything to be honest. Unfortunately this is going to be something that we need to address sooner rather than later. It does not appear to be too bad right now but obviously things can get significantly worse. 04-05-2021 upon evaluation today patient actually appears to be doing well in general in regard to his wounds. He does have an area growing on the left anterior shin which is somewhat concerning in fact [name] am wondering about the possibility of even a basal cell carcinoma. With that being said [name] did discuss this with the patient and his wife today [name] think that a biopsy may be the ideal thing to do. 04-19-2021 upon evaluation today patient's wounds are actually showing signs of improvement. The biopsy report [name] did review which showed squamous cell carcinoma we have actually referred him to dermatology he has a appointment with the skin surgery center which is actually for the beginning of May. With that being said [name] think that is absolutely appropriate obviously the sooner that he can get this done  the better. Overall [name] am extremely pleased with how things are going and how his wounds are improving. 05-03-2021 upon evaluation today patient's original wounds actually appear to be doing better he does have his upcoming appointment with a Mohs surgeon and hopefully they will be able to get this taken care of as far as that wound is concerned. With that being said with regard to his other wounds I do believe the collagen is doing quite well. Unfortunately he did get a new motorized wheelchair and got this to close to the edge of a curb and actually had a fall where he was thrown from it. This happens because to areas of skin tear 1 over the right thigh and 1 in the right upper arm both of which are still showing some signs of bleeding weeping this happened on Saturday. The patient obviously is in good spirits but nonetheless has a little frustrated with that situation. 05-10-2021 upon evaluation today patient appears to be doing well with regard to his lower extremity ulcers all of which are either healed or doing better. Unfortunately in regard to his wound on the right thigh this is what has been the most concern. [name] am afraid that this hematoma may actually end up opening into a  deeper wound. This is something we can have to keep a close eye on. With that being said I do believe that the right arm is healed and the left arm does exhibit issues here with a skin tear currently which again I do believe is new he hit this in the bathroom this past Saturday. 05-17-2021 upon evaluation today patient appears to be doing well with regard to his wounds. [name] am actually extremely pleased to see that the wound on his right thigh does not appear to be opening into a much deeper wound. The area of eschar did come off but it appears to be superficial right now hoping it stays this way. Fortunately I do not see any evidence of infection which is great news about the wounds on the lower extremity right and left appear  to be doing awesome. There is can be a need for a light sharp debridement at both locations today. 06-21-2021 upon evaluation today patient appears to be doing well all things considered. He did have a stroke and subsequently was in the hospital for a time that is why [name] have not seen him since May 17. The good news is nothing is worse and he seems to be making good progress here with his wounds he also had surgery at the skin surgery center and though there is some slough and biofilm buildup on the surface of this wound he is definitely seeing some improvement with granulation tissue here and very pleased in that regard. 07-05-2021 upon evaluation today patient appears to be doing well currently in regard to his wounds in general although he is having a lot more drainage than he has previously had. Fortunately there does not appear to be any evidence of active infection locally or systemically which is great news. No fevers, chills, nausea, vomiting, or diarrhea. 07-19-2021 upon evaluation today patient appears to be doing well with regard to his wound. He has been tolerating the dressing changes and all the wounds that we were dealing with last time are doing better which is great news. Unfortunately he has 2 new areas her right heel as well as his right lateral leg where he has a skin tear of the leg and a pressure injury to the heel. Fortunately I do not see any evidence of active infection locally or systemically at this time. 08-02-2021 upon evaluation today patient appears to be doing well currently in regard to his wound in general. He has been tolerating the dressing changes without complication. Fortunately there does not appear to be any evidence of active infection the only thing that [name] see right now is simply that the patient does have some issue currently with a little bit of what appears to be almost a deep tissue injury over the lateral ankle on the right side. This is the only spot that is  not doing better in general. 08-16-2021 upon evaluation today patient appears to be doing well currently in regard to his wound. He has been tolerating the dressing changes without complication. Fortunately there does not appear to be any evidence of active infection locally or systemically which is great news. Unfortunately he did have a fall and has a couple new open areas including a skin tear on the right thigh. [name] am hoping [name] can reapproximate this. 08-30-2021 upon evaluation today patient appears to be doing well currently in regard to his wound. He has been tolerating the dressing changes without complication. Fortunately there does not appear to be any signs  of infection everything seems to be healing quite nicely. [name] am very pleased with where things stand at this point. 09-13-2021 upon evaluation today patient appears to be doing well currently in regard to his wounds for the most part excepting the heel on his right foot. He does have several spots that are where he has areas that are rubbing especially on the leg which is not good. I do believe that he may benefit from switching away from traditional compression socks and using possibly a juxta lite compression wrap which we will look into ordering for him. 09-20-2020 upon evaluation today patient presents for reevaluation here in the clinic he actually seems to be doing quite well for the most part with regard to his wounds. He has been tolerating the dressing changes without complication and overall [name] am extremely pleased with where he stands currently. I do think that he is on the right track in a lot of ways here. 10-04-2021 upon evaluation today patient appears to be doing well currently in regard to some of his wounds of the toes do not appear to be doing quite as well as what [name] like to see. Fortunately I do not have any signs here of active infection systemically which is great news. With that being said unfortunately the patient is  having some issues here with the left second toe in particular although the right second toe is also not perfect. It does appear that the dressing has been put on a little bit too tightly. 10-11-2021 upon evaluation today patient appears to be doing decently well in regard to his wounds he does have a new wound from a fall on his right anterior portion of his leg just below the knee. This again happened as a result of him transferring. That seems to be when most of his injuries do occur. [name] discussed with him and his wife today about the possibility of a left. [name] think this can help and be much safer in these transfers. He has x-ray which [name] did review from 10- 07-2021 was negative for any signs of osteomyelitis in regard to the toes. 11-01-2021 upon evaluation today patient appears to be doing somewhat poorly in regard to his second toes of both feet. The left is actually worse than the right though there is some bruising and discoloration noted of the tip of the toe on the right as well. [name] am unsure as to why this is we have even avoided any Band- Aids to make sure there is nothing that was strangulating or pushing in too much causing circulation to be cut off. It makes me wonder if there is some shift and vascular status in general which again [name] am not sure when he last saw Dr. Unk Lightning his vascular physician but again that may be something to be considered. Nonetheless with the second toe left foot this does have the joint exposed and it appears to be breaking down with necrotic bone noted already. The patient tells me that he is really interested in proceeding with amputation so that it does not spread to anywhere else in his foot for that reason [name] think that we probably need to have him go to the ER for further evaluation and treatment. [name] [name], [name] [name] [name] ([id number]) [id number] Page 10 of 13 Objective Constitutional Well-nourished and well-hydrated in no acute  distress. Vitals Time Taken: 2:50 PM, Height: 72 in, Weight: 223 lbs, BMI: 30.2, Temperature: 97.6 F, Pulse: 72 bpm, Respiratory Rate: 20 breaths/min, Blood Pressure:  139/78 mmHg. Respiratory normal breathing without difficulty. Psychiatric this patient is able to make decisions and demonstrates good insight into disease process. Alert and Oriented x 3. pleasant and cooperative. General Notes: Upon inspection patient's wound bed actually showed signs of good granulation over most of the wounds although the right does show some increasing eschar which is new and also the Achilles area does not appear to be doing quite as well. The toe on the side is starting to become a little discolored. That is the second toe. On the left foot second toe this actually has become necrotic with evidence of necrotic bone noted this has actually made a shift fairly quickly here which is not good. Integumentary (Hair, Skin) Wound #14 status is Open. Original cause of wound was Surgical Injury. The date acquired was: 05/26/2021. The wound has been in treatment 17 weeks. The wound is located on the Left,Anterior Lower Leg. The wound measures 1cm length x 1.3cm width x 0.1cm depth; 1.021cm^2 area and 0.102cm^3 volume. There is Fat Layer (Subcutaneous Tissue) exposed. There is no tunneling or undermining noted. There is a medium amount of serosanguineous drainage noted. The wound margin is epibole. There is large (67-100%) red, friable granulation within the wound bed. There is no necrotic tissue within the wound bed. The periwound skin appearance had no abnormalities noted for texture. The periwound skin appearance did not exhibit: Dry/Scaly, Maceration, Atrophie Blanche, Cyanosis, Ecchymosis, Hemosiderin Staining, Mottled, Pallor, Rubor, Erythema. Periwound temperature was noted as No Abnormality. Wound #15 status is Open. Original cause of wound was Trauma. The date acquired was: 07/12/2021. The wound has been in  treatment 15 weeks. The wound is located on the Right Calcaneus. The wound measures 1.3cm length x 2.3cm width x 0.2cm depth; 2.348cm^2 area and 0.47cm^3 volume. There is Fat Layer (Subcutaneous Tissue) exposed. There is no tunneling or undermining noted. There is a medium amount of serosanguineous drainage noted. The wound margin is distinct with the outline attached to the wound base. There is no granulation within the wound bed. There is a large (67-100%) amount of necrotic tissue within the wound bed including Eschar and Adherent Slough. The periwound skin appearance exhibited: Callus. The periwound skin appearance did not exhibit: Crepitus, Excoriation, Induration, Rash, Scarring, Dry/Scaly, Maceration, Atrophie Blanche, Cyanosis, Ecchymosis, Hemosiderin Staining, Mottled, Pallor, Rubor, Erythema. Periwound temperature was noted as No Abnormality. Wound #20 status is Open. Original cause of wound was Trauma. The date acquired was: 09/06/2021. The wound has been in treatment 6 weeks. The wound is located on the Left T Second. The wound measures 1.5cm length x 1.5cm width x 0.1cm depth; 1.767cm^2 area and 0.177cm^3 volume. There is bone and oe Fat Layer (Subcutaneous Tissue) exposed. There is no tunneling or undermining noted. There is a medium amount of serosanguineous drainage noted. The wound margin is distinct with the outline attached to the wound base. There is no granulation within the wound bed. There is a large (67-100%) amount of necrotic tissue within the wound bed including Eschar and Adherent Slough. The periwound skin appearance had no abnormalities noted for color. The periwound skin appearance did not exhibit: Callus, Crepitus, Excoriation, Induration, Rash, Scarring, Dry/Scaly, Maceration. Wound #21 status is Open. Original cause of wound was Trauma. The date acquired was: 09/13/2021. The wound has been in treatment 6 weeks. The wound is located on the Right T Second. The wound  measures 1.5cm length x 1.5cm width x 0.1cm depth; 1.767cm^2 area and 0.177cm^3 volume. There is bone and oe Fat  Layer (Subcutaneous Tissue) exposed. There is no tunneling or undermining noted. There is a medium amount of serosanguineous drainage noted. Foul odor after cleansing was noted. The wound margin is distinct with the outline attached to the wound base. There is no granulation within the wound bed. There is a large (67-100%) amount of necrotic tissue within the wound bed including Eschar and Adherent Slough. The periwound skin appearance exhibited: Ecchymosis. The periwound skin appearance did not exhibit: Callus, Crepitus, Excoriation, Induration, Rash, Scarring, Dry/Scaly, Maceration, Atrophie Blanche, Cyanosis, Hemosiderin Staining, Mottled, Pallor, Rubor, Erythema. Wound #22 status is Open. Original cause of wound was Gradually Appeared. The date acquired was: 09/27/2021. The wound has been in treatment 4 weeks. The wound is located on the Right Achilles. The wound measures 1.4cm length x 1.7cm width x 0.2cm depth; 1.869cm^2 area and 0.374cm^3 volume. There is Fat Layer (Subcutaneous Tissue) exposed. There is no tunneling or undermining noted. There is a medium amount of serosanguineous drainage noted. The wound margin is distinct with the outline attached to the wound base. There is small (1-33%) red, pink granulation within the wound bed. There is a large (67- 100%) amount of necrotic tissue within the wound bed including Adherent Slough. The periwound skin appearance did not exhibit: Callus, Crepitus, Excoriation, Induration, Rash, Scarring, Dry/Scaly, Maceration, Atrophie Blanche, Cyanosis, Ecchymosis, Hemosiderin Staining, Mottled, Pallor, Rubor, Erythema. Periwound temperature was noted as No Abnormality. Wound #23 status is Open. Original cause of wound was Trauma. The date acquired was: 10/05/2021. The wound has been in treatment 3 weeks. The wound is located on the Right,Anterior  Lower Leg. The wound measures 0.9cm length x 0.4cm width x 0.1cm depth; 0.283cm^2 area and 0.028cm^3 volume. There is Fat Layer (Subcutaneous Tissue) exposed. There is no tunneling or undermining noted. There is a medium amount of serosanguineous drainage noted. The wound margin is distinct with the outline attached to the wound base. There is large (67-100%) pink granulation within the wound bed. There is a small (1-33%) amount of necrotic tissue within the wound bed including Adherent Slough. The periwound skin appearance did not exhibit: Callus, Crepitus, Excoriation, Induration, Rash, Scarring, Dry/Scaly, Maceration, Atrophie Blanche, Cyanosis, Ecchymosis, Hemosiderin Staining, Mottled, Pallor, Rubor, Erythema. Wound #3 status is Open. Original cause of wound was Gradually Appeared. The date acquired was: 10/12/2020. The wound has been in treatment 50 weeks. The wound is located on the Left,Lateral Ankle. The wound measures 0cm length x 0cm width x 0cm depth; 0cm^2 area and 0cm^3 volume. There is no tunneling or undermining noted. There is a none present amount of drainage noted. The wound margin is epibole. There is no granulation within the wound bed. There is no necrotic tissue within the wound bed. The periwound skin appearance did not exhibit: Callus, Crepitus, Excoriation, Induration, Rash, Scarring, Dry/Scaly, Maceration, Atrophie Blanche, Cyanosis, Ecchymosis, Hemosiderin Staining, Mottled, Pallor, Rubor, Erythema. 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 Pressure ulcer of sacral region, stage 3 Laceration without foreign body, right thigh,  subsequent encounter Disruption of external operation (surgical) wound, not elsewhere classified, initial encounter Non-pressure chronic ulcer of other part of right foot with fat layer exposed Non-pressure chronic ulcer of other part of left foot with fat layer exposed Benign prostatic hyperplasia with lower  urinary tract symptoms Chronic kidney disease, stage 3 unspecified Essential (primary) hypertension Procedures Wound #15 Pre-procedure diagnosis of Wound #15 is a Pressure Ulcer located on the Right Calcaneus . There was a Selective/Open Wound Non-Viable Tissue Debridement with a total area of 2.99 sq cm performed by [name] Keeler, PA. With the following instrument(s): Curette to remove Viable and Non-Viable tissue/material. Material removed includes Callus after achieving pain control using Lidocaine. No specimens were taken. A time out was conducted at 15:53, prior to the start of the procedure. A Minimum amount of bleeding was controlled with Pressure. The procedure was tolerated well with a pain level of 0 throughout and a pain level of 0 following the procedure. Post Debridement Measurements: 1.3cm length x 2.3cm width x 0.2cm depth; 0.47cm^3 volume. Post debridement Stage noted as Category/Stage III. Character of Wound/Ulcer Post Debridement is improved. Post procedure Diagnosis Wound #15: Same as Pre-Procedure Plan Follow-up Appointments: Return Appointment in 2 weeks. Jeri Cos, PA Other: - Go to Emergency Room as soon as possible to get evaluated for your left 2nd toe Anesthetic: (In clinic) Topical Lidocaine 5% applied to wound bed Bathing/ Shower/ Hygiene: May shower and wash wound with soap and water. - with dressing changes. Edema Control - Lymphedema / SCD / Other: Lymphedema Pumps. Use Lymphedema pumps on leg(s) 2-3 times a day for 45-60 minutes. If wearing any wraps or hose, do not remove them. Continue exercising as instructed. - 2 times a day for an hour each  time. Elevate legs to the level of the heart or above for 30 minutes daily and/or when sitting, a frequency of: - throughout the day. Avoid standing for long periods of time. Patient to wear own compression stockings every day. - wear juxtalites HD Moisturize legs daily. - both legs and feet with dressing changes. Compression stocking or Garment 20-30 mm/Hg pressure to: - juxtalite HD apply in the morning and remove at night. Off-Loading: Other: - bunny boots to both feet while resting in bed or chair. If unable to use bunny boots use pillow to elevate both legs to offload pressure to heels. Ensure no pressure to wounds to aid in healing. Additional Orders / Instructions: Other: - May continue to walk and work with physical therapy. apply xeroform to the skin tear areas to arms for protection. WOUND #14: - Lower Leg Wound Laterality: Left, Anterior Cleanser: Soap and Water 3 x Per Week/30 Days Discharge Instructions: May shower and wash wound with dial antibacterial soap and water prior to dressing change. Peri-Wound Care: Skin Prep (Generic) 3 x Per Week/30 Days Discharge Instructions: Use skin prep as directed Prim Dressing: Promogran Prisma Matrix, 4.34 (sq in) (silver collagen) (Dispense As Written) 3 x Per Week/30 Days ary Discharge Instructions: Moisten collagen with hydrogel or KY jelly. Secondary Dressing: Woven Gauze Sponge, Non-Sterile 4x4 in 3 x Per Week/30 Days Discharge Instructions: Apply over primary dressing as directed. Secured With: Coban Self-Adherent Wrap 4x5 (in/yd) 3 x Per Week/30 Days Discharge Instructions: Secure with Coban ONLY ONE LAYER AROUND LEG.**** Secured With: Kerlix Roll Sterile, 4.5x3.1 (in/yd) (Generic) 3 x Per Week/30 Days Discharge Instructions: Secure with Kerlix as directed. Secured With: 15M Medipore H Soft Cloth Surgical T ape, 4 x 10 (in/yd) 3 x Per Week/30 Days Discharge Instructions: Secure with tape as directed. WOUND #15: - Calcaneus Wound  Laterality: Right Cleanser: Soap and Water 3 x Per Week/30 Days Discharge Instructions: May shower and wash wound with dial antibacterial soap and water prior to dressing  change. Cleanser: Wound Cleanser 3 x Per Week/30 Days Discharge Instructions: Cleanse the wound with wound cleanser prior to applying a clean dressing using gauze sponges, not tissue or cotton balls. Prim Dressing: Xeroform Occlusive Gauze Dressing, 4x4 in 3 x Per Week/30 Days ary Discharge Instructions: Apply to wound bed as instructed Secondary Dressing: ALLEVYN Heel 4 1/2in x 5 1/2in / 10.5cm x 13.5cm (Generic) 3 x Per Week/30 Days Discharge Instructions: Apply over primary dressing as directed. Secondary Dressing: Woven Gauze Sponge, Non-Sterile 4x4 in (Generic) 3 x Per Week/30 Days Discharge Instructions: ***PLEASE SEND BULK.***Apply over primary dressing as directed. Secured With: [employer], 4.5x3.1 (in/yd) (Generic) 3 x Per Week/30 Days Discharge Instructions: Secure with Kerlix as directed. WOUND #20: - T Second Wound Laterality: Left oe Cleanser: Soap and Water 1 x Per Day/30 Days Discharge Instructions: May shower and wash wound with dial antibacterial soap and water prior to dressing change. Cleanser: Wound Cleanser 1 x Per Day/30 Days Discharge Instructions: Cleanse the wound with wound cleanser prior to applying a clean dressing using gauze sponges, not tissue or cotton balls. Prim Dressing: Xeroform Occlusive Gauze Dressing, 4x4 in 1 x Per Day/30 Days ary [name] [name], [name] [name] ([id number]) [id number] Page 12 of 13 Discharge Instructions: Apply to wound bed as instructed Secondary Dressing: Woven Gauze Sponges 2x2 in 1 x Per Day/30 Days Discharge Instructions: Apply over primary dressing as directed. Secured With: Stretch Net Size 2, 10 (yds) (Generic) 1 x Per Day/30 Days WOUND #21: - T Second Wound Laterality: Right oe Cleanser: Soap and Water 1 x Per Day/30 Days Discharge  Instructions: May shower and wash wound with dial antibacterial soap and water prior to dressing change. Cleanser: Wound Cleanser 1 x Per Day/30 Days Discharge Instructions: Cleanse the wound with wound cleanser prior to applying a clean dressing using gauze sponges, not tissue or cotton balls. Prim Dressing: Xeroform Occlusive Gauze Dressing, 4x4 in 1 x Per Day/30 Days ary Discharge Instructions: Apply to wound bed as instructed Secondary Dressing: Woven Gauze Sponges 2x2 in 1 x Per Day/30 Days Discharge Instructions: Apply over primary dressing as directed. Secured With: [employer] Size 2, 10 (yds) (Generic) 1 x Per Day/30 Days WOUND #22: - Achilles Wound Laterality: Right Cleanser: Soap and Water 3 x Per Week/30 Days Discharge Instructions: May shower and wash wound with dial antibacterial soap and water prior to dressing change. Cleanser: Wound Cleanser 3 x Per Week/30 Days Discharge Instructions: Cleanse the wound with wound cleanser prior to applying a clean dressing using gauze sponges, not tissue or cotton balls. Prim Dressing: Xeroform Occlusive Gauze Dressing, 4x4 in 3 x Per Week/30 Days ary Discharge Instructions: Apply to wound bed as instructed Secondary Dressing: ABD Pad, 5x9 3 x Per Week/30 Days Discharge Instructions: Apply over primary dressing as directed. Secured With: Coban Self-Adherent Wrap 4x5 (in/yd) 3 x Per Week/30 Days Discharge Instructions: Secure with Coban as directed. Secured With: [employer], 4.5x3.1 (in/yd) (Generic) 3 x Per Week/30 Days Discharge Instructions: Secure with Kerlix as directed. WOUND #23: - Lower Leg Wound Laterality: Right, Anterior Cleanser: Soap and Water 3 x Per Week/30 Days Discharge Instructions: May shower and wash wound with dial antibacterial soap and water prior to dressing change. Cleanser: Wound Cleanser 3 x Per Week/30 Days Discharge Instructions: Cleanse the wound with wound cleanser prior to applying a clean dressing  using gauze sponges, not tissue or cotton balls. Prim Dressing: Xeroform Occlusive Gauze Dressing, 4x4 in 3 x Per Week/30 Days ary Discharge Instructions:  Apply to wound bed as instructed Secondary Dressing: ABD Pad, 5x9 3 x Per Week/30 Days Discharge Instructions: Apply over primary dressing as directed. Secured With: Coban Self-Adherent Wrap 4x5 (in/yd) 3 x Per Week/30 Days Discharge Instructions: Secure with Coban as directed. Secured With: [employer], 4.5x3.1 (in/yd) (Generic) 3 x Per Week/30 Days Discharge Instructions: Secure with Kerlix as directed. 1. Based on what [name] am seeing I do believe the patient should go to the ER for further evaluation and treatment. Based on what he is telling me I do think he wants to proceed with amputation of the left second toe so as not to allow any infection to spread anywhere else in the foot which [name] completely understand. 2. [name] am also going to recommend at this time based on what [name] am seeing that the patient needs to have them checked out the right second toe as well to make sure there is nothing going on here from an infection standpoint and osteomyelitis as well. 3. [name] am also going to suggest that we have him continue with Xeroform and collagen to the other wounds as appropriate per above. Again these seem to be doing well in general but [name] am much more concerned about the right second toe, left second toe especially, and right heel. 4. He has previously had vascular intervention with Dr. Unk Lightning [name] am unsure if there is been some shift in his vascular status that is accounting for some of what were seen here not but I do think that may need to be checked as well when he is evaluated in the ER. We will see patient back for reevaluation in 2 weeks here in the clinic. If anything worsens or changes patient will contact our office for additional recommendations. Electronic Signature(s) Signed: 11/01/2021 4:02:21 PM By: [name] Keeler PA-C Entered  By: [name] [name] on 11/01/2021 16:02:21 -------------------------------------------------------------------------------- SuperBill Details Patient Name: Date of Service: [name] [name], [name] [name]. 11/01/2021 Medical Record Number: [id number] Patient Account Number: [id number] Date of Birth/Sex: Treating RN: [birth year] (88 y.o. [name] Primary Care Provider: Jerlyn [name] Other Clinician: Referring Provider: Treating Provider/Extender: [name] in Treatment: 20 Diagnosis Coding ICD-10 Codes Code Description [phone] Pressure ulcer of right heel, stage 3 I73.89 Other specified peripheral vascular diseases [name] [name], ARTMAN [name] ([id number]) [id number] Page 13 of 13 I87.2 Venous insufficiency (chronic) (peripheral) L97.312 Non-pressure chronic ulcer of right ankle with fat layer exposed L97.822 Non-pressure chronic ulcer of other part of left lower leg with fat layer exposed L89.153 Pressure ulcer of sacral region, stage 3 S71.111D Laceration without foreign body, right thigh, subsequent encounter T81.31XA Disruption of external operation (surgical) wound, not elsewhere classified, initial encounter L97.512 Non-pressure chronic ulcer of other part of right foot with fat layer exposed L97.522 Non-pressure chronic ulcer of other part of left foot with fat layer exposed N40.1 Benign prostatic hyperplasia with lower urinary tract symptoms N18.30 Chronic kidney disease, stage 3 unspecified I10 Essential (primary) hypertension Facility Procedures : CPT4 Code: [id number] Description: 56256 - WOUND CARE VISIT-LEV 5 EST PT Modifier: Quantity: 1 : CPT4 Code: [id number] Description: 87681 - DEB SUBQ TISSUE 20 SQ CM/< ICD-10 Diagnosis Description L89.613 Pressure ulcer of right heel, stage 3 Modifier: Quantity: 1 Physician Procedures : CPT4 Code Description Modifier [id number] 35597 - WC PHYS LEVEL 5 - EST PT 25 ICD-10 Diagnosis Description L89.613  Pressure ulcer of right heel, stage 3 I73.89 Other specified peripheral vascular diseases I87.2 Venous insufficiency (  chronic) (peripheral)  L97.512 Non-pressure chronic ulcer of other part of right foot with fat layer exposed Quantity: 1 : [id number] 11042 - WC PHYS SUBQ TISS 20 SQ CM ICD-10 Diagnosis Description L89.613 Pressure ulcer of right heel, stage 3 Quantity: 1 Electronic Signature(s) Signed: 11/01/2021 4:02:59 PM By: [name] Keeler PA-C Entered By: [name] [name] on 11/01/2021 16:02:59

## 2021-11-02 ENCOUNTER — Emergency Department (HOSPITAL_COMMUNITY): Payer: Medicare Other

## 2021-11-02 ENCOUNTER — Inpatient Hospital Stay (HOSPITAL_COMMUNITY)
Admission: EM | Admit: 2021-11-02 | Discharge: 2021-11-14 | DRG: 240 | Disposition: A | Discharge status: Skilled Nursing Facility | Payer: Medicare Other | Source: Ambulatory Visit | Attending: Internal Medicine | Admitting: Internal Medicine

## 2021-11-02 ENCOUNTER — Inpatient Hospital Stay (HOSPITAL_COMMUNITY): Payer: Medicare Other

## 2021-11-02 ENCOUNTER — Other Ambulatory Visit: Payer: Self-pay

## 2021-11-02 ENCOUNTER — Encounter (HOSPITAL_COMMUNITY): Payer: Self-pay

## 2021-11-02 DIAGNOSIS — Z87891 Personal history of nicotine dependence: Secondary | ICD-10-CM

## 2021-11-02 DIAGNOSIS — I890 Lymphedema, not elsewhere classified: Secondary | ICD-10-CM | POA: Diagnosis present

## 2021-11-02 DIAGNOSIS — L97529 Non-pressure chronic ulcer of other part of left foot with unspecified severity: Secondary | ICD-10-CM | POA: Diagnosis not present

## 2021-11-02 DIAGNOSIS — E872 Acidosis, unspecified: Secondary | ICD-10-CM | POA: Diagnosis not present

## 2021-11-02 DIAGNOSIS — L97312 Non-pressure chronic ulcer of right ankle with fat layer exposed: Secondary | ICD-10-CM | POA: Diagnosis present

## 2021-11-02 DIAGNOSIS — I5042 Chronic combined systolic (congestive) and diastolic (congestive) heart failure: Secondary | ICD-10-CM | POA: Diagnosis present

## 2021-11-02 DIAGNOSIS — Z7952 Long term (current) use of systemic steroids: Secondary | ICD-10-CM

## 2021-11-02 DIAGNOSIS — L97519 Non-pressure chronic ulcer of other part of right foot with unspecified severity: Principal | ICD-10-CM | POA: Diagnosis present

## 2021-11-02 DIAGNOSIS — I5032 Chronic diastolic (congestive) heart failure: Secondary | ICD-10-CM | POA: Diagnosis not present

## 2021-11-02 DIAGNOSIS — N1831 Chronic kidney disease, stage 3a: Secondary | ICD-10-CM

## 2021-11-02 DIAGNOSIS — G4733 Obstructive sleep apnea (adult) (pediatric): Secondary | ICD-10-CM | POA: Diagnosis not present

## 2021-11-02 DIAGNOSIS — I959 Hypotension, unspecified: Secondary | ICD-10-CM | POA: Diagnosis present

## 2021-11-02 DIAGNOSIS — E669 Obesity, unspecified: Secondary | ICD-10-CM | POA: Diagnosis present

## 2021-11-02 DIAGNOSIS — Z87440 Personal history of urinary (tract) infections: Secondary | ICD-10-CM

## 2021-11-02 DIAGNOSIS — I70262 Atherosclerosis of native arteries of extremities with gangrene, left leg: Principal | ICD-10-CM | POA: Diagnosis present

## 2021-11-02 DIAGNOSIS — M069 Rheumatoid arthritis, unspecified: Secondary | ICD-10-CM | POA: Diagnosis present

## 2021-11-02 DIAGNOSIS — I70245 Atherosclerosis of native arteries of left leg with ulceration of other part of foot: Secondary | ICD-10-CM | POA: Diagnosis not present

## 2021-11-02 DIAGNOSIS — I70235 Atherosclerosis of native arteries of right leg with ulceration of other part of foot: Secondary | ICD-10-CM | POA: Diagnosis present

## 2021-11-02 DIAGNOSIS — Z881 Allergy status to other antibiotic agents status: Secondary | ICD-10-CM

## 2021-11-02 DIAGNOSIS — R627 Adult failure to thrive: Secondary | ICD-10-CM | POA: Diagnosis present

## 2021-11-02 DIAGNOSIS — F03A0 Unspecified dementia, mild, without behavioral disturbance, psychotic disturbance, mood disturbance, and anxiety: Secondary | ICD-10-CM | POA: Diagnosis not present

## 2021-11-02 DIAGNOSIS — L97528 Non-pressure chronic ulcer of other part of left foot with other specified severity: Secondary | ICD-10-CM | POA: Diagnosis present

## 2021-11-02 DIAGNOSIS — Z7901 Long term (current) use of anticoagulants: Secondary | ICD-10-CM

## 2021-11-02 DIAGNOSIS — I69351 Hemiplegia and hemiparesis following cerebral infarction affecting right dominant side: Secondary | ICD-10-CM

## 2021-11-02 DIAGNOSIS — I130 Hypertensive heart and chronic kidney disease with heart failure and stage 1 through stage 4 chronic kidney disease, or unspecified chronic kidney disease: Secondary | ICD-10-CM | POA: Diagnosis present

## 2021-11-02 DIAGNOSIS — Z888 Allergy status to other drugs, medicaments and biological substances status: Secondary | ICD-10-CM

## 2021-11-02 DIAGNOSIS — F0390 Unspecified dementia without behavioral disturbance: Secondary | ICD-10-CM | POA: Diagnosis present

## 2021-11-02 DIAGNOSIS — Z993 Dependence on wheelchair: Secondary | ICD-10-CM

## 2021-11-02 DIAGNOSIS — M19071 Primary osteoarthritis, right ankle and foot: Secondary | ICD-10-CM | POA: Diagnosis not present

## 2021-11-02 DIAGNOSIS — I110 Hypertensive heart disease with heart failure: Secondary | ICD-10-CM | POA: Diagnosis not present

## 2021-11-02 DIAGNOSIS — M79601 Pain in right arm: Secondary | ICD-10-CM | POA: Diagnosis not present

## 2021-11-02 DIAGNOSIS — I252 Old myocardial infarction: Secondary | ICD-10-CM | POA: Diagnosis not present

## 2021-11-02 DIAGNOSIS — Z6827 Body mass index (BMI) 27.0-27.9, adult: Secondary | ICD-10-CM

## 2021-11-02 DIAGNOSIS — I25119 Atherosclerotic heart disease of native coronary artery with unspecified angina pectoris: Secondary | ICD-10-CM | POA: Diagnosis not present

## 2021-11-02 DIAGNOSIS — G8929 Other chronic pain: Secondary | ICD-10-CM | POA: Diagnosis present

## 2021-11-02 DIAGNOSIS — I82611 Acute embolism and thrombosis of superficial veins of right upper extremity: Secondary | ICD-10-CM | POA: Diagnosis not present

## 2021-11-02 DIAGNOSIS — L089 Local infection of the skin and subcutaneous tissue, unspecified: Principal | ICD-10-CM

## 2021-11-02 DIAGNOSIS — Z79899 Other long term (current) drug therapy: Secondary | ICD-10-CM

## 2021-11-02 DIAGNOSIS — R630 Anorexia: Secondary | ICD-10-CM | POA: Diagnosis present

## 2021-11-02 DIAGNOSIS — Z87892 Personal history of anaphylaxis: Secondary | ICD-10-CM

## 2021-11-02 DIAGNOSIS — Z66 Do not resuscitate: Secondary | ICD-10-CM | POA: Diagnosis present

## 2021-11-02 DIAGNOSIS — M869 Osteomyelitis, unspecified: Secondary | ICD-10-CM

## 2021-11-02 DIAGNOSIS — G629 Polyneuropathy, unspecified: Secondary | ICD-10-CM | POA: Diagnosis present

## 2021-11-02 DIAGNOSIS — Z952 Presence of prosthetic heart valve: Secondary | ICD-10-CM | POA: Diagnosis not present

## 2021-11-02 DIAGNOSIS — I96 Gangrene, not elsewhere classified: Secondary | ICD-10-CM

## 2021-11-02 DIAGNOSIS — Z823 Family history of stroke: Secondary | ICD-10-CM

## 2021-11-02 DIAGNOSIS — I739 Peripheral vascular disease, unspecified: Secondary | ICD-10-CM | POA: Diagnosis not present

## 2021-11-02 DIAGNOSIS — N183 Chronic kidney disease, stage 3 unspecified: Secondary | ICD-10-CM | POA: Diagnosis present

## 2021-11-02 DIAGNOSIS — Z951 Presence of aortocoronary bypass graft: Secondary | ICD-10-CM

## 2021-11-02 DIAGNOSIS — L0390 Cellulitis, unspecified: Secondary | ICD-10-CM

## 2021-11-02 DIAGNOSIS — I480 Paroxysmal atrial fibrillation: Secondary | ICD-10-CM | POA: Diagnosis not present

## 2021-11-02 DIAGNOSIS — M353 Polymyalgia rheumatica: Secondary | ICD-10-CM | POA: Diagnosis present

## 2021-11-02 DIAGNOSIS — N1832 Chronic kidney disease, stage 3b: Secondary | ICD-10-CM | POA: Diagnosis present

## 2021-11-02 DIAGNOSIS — L89612 Pressure ulcer of right heel, stage 2: Secondary | ICD-10-CM | POA: Diagnosis present

## 2021-11-02 DIAGNOSIS — R32 Unspecified urinary incontinence: Secondary | ICD-10-CM | POA: Diagnosis present

## 2021-11-02 DIAGNOSIS — R001 Bradycardia, unspecified: Secondary | ICD-10-CM | POA: Diagnosis present

## 2021-11-02 DIAGNOSIS — E785 Hyperlipidemia, unspecified: Secondary | ICD-10-CM | POA: Diagnosis present

## 2021-11-02 DIAGNOSIS — Z9049 Acquired absence of other specified parts of digestive tract: Secondary | ICD-10-CM

## 2021-11-02 DIAGNOSIS — I10 Essential (primary) hypertension: Secondary | ICD-10-CM | POA: Diagnosis present

## 2021-11-02 DIAGNOSIS — I69328 Other speech and language deficits following cerebral infarction: Secondary | ICD-10-CM

## 2021-11-02 DIAGNOSIS — Z880 Allergy status to penicillin: Secondary | ICD-10-CM

## 2021-11-02 DIAGNOSIS — S91105A Unspecified open wound of left lesser toe(s) without damage to nail, initial encounter: Secondary | ICD-10-CM | POA: Diagnosis not present

## 2021-11-02 DIAGNOSIS — F419 Anxiety disorder, unspecified: Secondary | ICD-10-CM | POA: Diagnosis present

## 2021-11-02 DIAGNOSIS — I6930 Unspecified sequelae of cerebral infarction: Secondary | ICD-10-CM

## 2021-11-02 DIAGNOSIS — I2510 Atherosclerotic heart disease of native coronary artery without angina pectoris: Secondary | ICD-10-CM | POA: Diagnosis present

## 2021-11-02 DIAGNOSIS — M868X7 Other osteomyelitis, ankle and foot: Secondary | ICD-10-CM | POA: Diagnosis not present

## 2021-11-02 DIAGNOSIS — M7989 Other specified soft tissue disorders: Secondary | ICD-10-CM | POA: Diagnosis not present

## 2021-11-02 DIAGNOSIS — Z885 Allergy status to narcotic agent status: Secondary | ICD-10-CM

## 2021-11-02 DIAGNOSIS — K219 Gastro-esophageal reflux disease without esophagitis: Secondary | ICD-10-CM | POA: Diagnosis present

## 2021-11-02 DIAGNOSIS — Z7983 Long term (current) use of bisphosphonates: Secondary | ICD-10-CM

## 2021-11-02 DIAGNOSIS — Z961 Presence of intraocular lens: Secondary | ICD-10-CM | POA: Diagnosis present

## 2021-11-02 DIAGNOSIS — Z9889 Other specified postprocedural states: Secondary | ICD-10-CM | POA: Diagnosis not present

## 2021-11-02 DIAGNOSIS — Z7982 Long term (current) use of aspirin: Secondary | ICD-10-CM

## 2021-11-02 DIAGNOSIS — I509 Heart failure, unspecified: Secondary | ICD-10-CM | POA: Diagnosis not present

## 2021-11-02 DIAGNOSIS — L89610 Pressure ulcer of right heel, unstageable: Secondary | ICD-10-CM | POA: Diagnosis not present

## 2021-11-02 DIAGNOSIS — K5900 Constipation, unspecified: Secondary | ICD-10-CM | POA: Diagnosis present

## 2021-11-02 DIAGNOSIS — N368 Other specified disorders of urethra: Secondary | ICD-10-CM | POA: Diagnosis present

## 2021-11-02 DIAGNOSIS — Z9989 Dependence on other enabling machines and devices: Secondary | ICD-10-CM

## 2021-11-02 DIAGNOSIS — Z87442 Personal history of urinary calculi: Secondary | ICD-10-CM

## 2021-11-02 LAB — CBC WITH DIFFERENTIAL/PLATELET
Abs Immature Granulocytes: 0.02 10*3/uL (ref 0.00–0.07)
Basophils Absolute: 0 10*3/uL (ref 0.0–0.1)
Basophils Relative: 1 %
Eosinophils Absolute: 0.1 10*3/uL (ref 0.0–0.5)
Eosinophils Relative: 3 %
HCT: 30.7 % — ABNORMAL LOW (ref 39.0–52.0)
Hemoglobin: 9.2 g/dL — ABNORMAL LOW (ref 13.0–17.0)
Immature Granulocytes: 0 %
Lymphocytes Relative: 20 %
Lymphs Abs: 1 10*3/uL (ref 0.7–4.0)
MCH: 28.3 pg (ref 26.0–34.0)
MCHC: 30 g/dL (ref 30.0–36.0)
MCV: 94.5 fL (ref 80.0–100.0)
Monocytes Absolute: 0.6 10*3/uL (ref 0.1–1.0)
Monocytes Relative: 13 %
Neutro Abs: 3.3 10*3/uL (ref 1.7–7.7)
Neutrophils Relative %: 63 %
Platelets: 149 10*3/uL — ABNORMAL LOW (ref 150–400)
RBC: 3.25 MIL/uL — ABNORMAL LOW (ref 4.22–5.81)
RDW: 16.1 % — ABNORMAL HIGH (ref 11.5–15.5)
WBC: 5.1 10*3/uL (ref 4.0–10.5)
nRBC: 0 % (ref 0.0–0.2)

## 2021-11-02 LAB — COMPREHENSIVE METABOLIC PANEL
ALT: 17 U/L (ref 0–44)
AST: 21 U/L (ref 15–41)
Albumin: 3.1 g/dL — ABNORMAL LOW (ref 3.5–5.0)
Alkaline Phosphatase: 114 U/L (ref 38–126)
Anion gap: 10 (ref 5–15)
BUN: 28 mg/dL — ABNORMAL HIGH (ref 8–23)
CO2: 28 mmol/L (ref 22–32)
Calcium: 10.2 mg/dL (ref 8.9–10.3)
Chloride: 102 mmol/L (ref 98–111)
Creatinine, Ser: 1.58 mg/dL — ABNORMAL HIGH (ref 0.61–1.24)
GFR, Estimated: 41 mL/min — ABNORMAL LOW (ref >60)
Glucose, Bld: 98 mg/dL (ref 70–99)
Potassium: 3.7 mmol/L (ref 3.5–5.1)
Sodium: 140 mmol/L (ref 135–145)
Total Bilirubin: 0.4 mg/dL (ref 0.3–1.2)
Total Protein: 7 g/dL (ref 6.5–8.1)

## 2021-11-02 LAB — PROTIME-INR
INR: 1.1 (ref 0.8–1.2)
Prothrombin Time: 13.9 seconds (ref 11.4–15.2)

## 2021-11-02 LAB — APTT: aPTT: 32 seconds (ref 24–36)

## 2021-11-02 LAB — HEPARIN LEVEL (UNFRACTIONATED): Heparin Unfractionated: >1.1 IU/mL — ABNORMAL HIGH (ref 0.30–0.70)

## 2021-11-02 LAB — LACTIC ACID, PLASMA
Lactic Acid, Venous: 1.3 mmol/L (ref 0.5–1.9)
Lactic Acid, Venous: 1.4 mmol/L (ref 0.5–1.9)

## 2021-11-02 LAB — C-REACTIVE PROTEIN: CRP: 5 mg/dL — ABNORMAL HIGH (ref <1.0)

## 2021-11-02 LAB — SEDIMENTATION RATE: Sed Rate: 103 mm/hr — ABNORMAL HIGH (ref 0–16)

## 2021-11-02 MED ORDER — SODIUM CHLORIDE 0.9% FLUSH
3.0000 mL | Freq: Two times a day (BID) | INTRAVENOUS | Status: DC
  Administered 2021-11-02 – 2021-11-14 (×10): 3 mL via INTRAVENOUS

## 2021-11-02 MED ORDER — HYDRALAZINE HCL 20 MG/ML IJ SOLN: 10.0000 mg | INTRAMUSCULAR | Status: DC | PRN

## 2021-11-02 MED ORDER — ATORVASTATIN CALCIUM 40 MG PO TABS
40.0000 mg | ORAL_TABLET | Freq: Every day | ORAL | Status: DC
  Administered 2021-11-03: 40 mg via ORAL
  Filled 2021-11-02: qty 1

## 2021-11-02 MED ORDER — ACETAMINOPHEN 650 MG RE SUPP: 650.0000 mg | Freq: Four times a day (QID) | RECTAL | Status: DC | PRN

## 2021-11-02 MED ORDER — ALBUTEROL SULFATE (2.5 MG/3ML) 0.083% IN NEBU: 2.5000 mg | INHALATION_SOLUTION | Freq: Four times a day (QID) | RESPIRATORY_TRACT | Status: DC | PRN

## 2021-11-02 MED ORDER — TAMSULOSIN HCL 0.4 MG PO CAPS
0.4000 mg | ORAL_CAPSULE | Freq: Every day | ORAL | Status: DC
  Administered 2021-11-02 – 2021-11-14 (×12): 0.4 mg via ORAL
  Filled 2021-11-02 (×13): qty 1

## 2021-11-02 MED ORDER — MEMANTINE HCL 10 MG PO TABS
10.0000 mg | ORAL_TABLET | Freq: Two times a day (BID) | ORAL | Status: DC
  Administered 2021-11-02 – 2021-11-14 (×23): 10 mg via ORAL
  Filled 2021-11-02 (×24): qty 1

## 2021-11-02 MED ORDER — ACETAMINOPHEN 325 MG PO TABS: 650.0000 mg | ORAL_TABLET | Freq: Four times a day (QID) | ORAL | Status: DC | PRN

## 2021-11-02 MED ORDER — ISOSORBIDE MONONITRATE ER 60 MG PO TB24
60.0000 mg | ORAL_TABLET | Freq: Every day | ORAL | Status: DC
  Administered 2021-11-03 – 2021-11-09 (×6): 60 mg via ORAL
  Filled 2021-11-02 (×6): qty 1

## 2021-11-02 MED ORDER — GALANTAMINE HYDROBROMIDE ER 8 MG PO CP24
8.0000 mg | ORAL_CAPSULE | Freq: Every day | ORAL | Status: DC
  Administered 2021-11-03 – 2021-11-14 (×11): 8 mg via ORAL
  Filled 2021-11-02 (×14): qty 1

## 2021-11-02 MED ORDER — TRAMADOL HCL 50 MG PO TABS
25.0000 mg | ORAL_TABLET | Freq: Four times a day (QID) | ORAL | Status: DC | PRN
  Administered 2021-11-02 – 2021-11-09 (×3): 25 mg via ORAL
  Filled 2021-11-02 (×3): qty 1

## 2021-11-02 MED ORDER — HEPARIN (PORCINE) 25000 UT/250ML-% IV SOLN
1250.0000 [IU]/h | INTRAVENOUS | Status: DC
  Administered 2021-11-02: 1400 [IU]/h via INTRAVENOUS
  Administered 2021-11-04 – 2021-11-06 (×3): 1250 [IU]/h via INTRAVENOUS
  Filled 2021-11-02 (×5): qty 250

## 2021-11-02 NOTE — ED Provider Notes (Signed)
[facility] EMERGENCY DEPARTMENT Provider Note   CSN: [id number] Arrival date & time: 11/02/21  1025     History  Chief Complaint  Patient presents with   Wound Infection    [name] [name] is a 88 y.o. male.  [name] [name] is a 88 y.o. male with hx of HTN, BPH, HLD, CHF, CAD s/p CABG, PAD, AAA, A fib, who presents to the ED accompanied by his wife for evaluation of worsening wounds to both feet. Pt send my provider at wound care center. Pt has been followed there for over a year for chronic wounds to both feet, primarily on the second toes of both feet and the right heel. Pt has had prior intervention with vascular surgery. Worsening of the wound on the left second toe in particular led to wound center sending him in. They have noted significant acute worsening with some redness extending to the proximal foot. Concern for infection or osteomyelitis, and risk for worsening and spread. Wound not improving despite their interventions. Wound care center recommended evaluation for possible amputation. No fevers or systemic symptoms.  The history is provided by the patient, the spouse and medical records.       Home Medications Prior to Admission medications   Medication Sig Start Date End Date Taking? Authorizing Provider  acetaminophen (TYLENOL) 325 MG tablet Take 2 tablets (650 mg total) by mouth every 4 (four) hours as needed for mild pain (or temp > 37.5 C (99.5 F)). 06/13/21   Angiulli, Lavon Paganini, PA-C  Acetaminophen 500 MG capsule 2-3 capsules as needed Orally every 6 hrs    [provider]  amiodarone (PACERONE) 200 MG tablet Take 1 tablet (200 mg total) by mouth daily. 06/13/21   Angiulli, Lavon Paganini, PA-C  amLODipine (NORVASC) 10 MG tablet 1 tablet Orally Once a day Patient not taking: Reported on 10/19/2021    [provider]  apixaban (ELIQUIS) 2.5 MG TABS tablet take one twice a day Orally twice a day for 90 days    [provider]   aspirin EC 81 MG tablet     [provider]  atorvastatin (LIPITOR) 40 MG tablet 1 tablet Orally Once a day    [provider]  cadexomer iodine (IODOSORB) 0.9 % gel Apply 1 application. topically every other day as needed for wound care.    [provider]  cholecalciferol (VITAMIN D3) 25 MCG (1000 UNIT) tablet Take 1 tablet (1,000 Units total) by mouth daily. 06/13/21   Angiulli, Lavon Paganini, PA-C  collagenase (SANTYL) 250 UNIT/GM ointment     [provider]  Cyanocobalamin (VITAMIN B-12) 5000 MCG TBDP Take 5,000 mcg by mouth daily. 06/13/21   Angiulli, Lavon Paganini, PA-C  cyclobenzaprine (FLEXERIL) 5 MG tablet Take 1 tablet (5 mg total) by mouth 3 (three) times daily as needed for muscle spasms. 10/19/21   Kirsteins, Luanna Salk, MD  denosumab (PROLIA) 60 MG/ML SOSY injection Inject 60 mg into the skin every 6 (six) months.    [provider]  docusate sodium (COLACE) 100 MG capsule Take 1 capsule (100 mg total) by mouth 2 (two) times daily. Patient taking differently: Take 100 mg by mouth every evening. 08/23/20   Hongalgi, Lenis Dickinson, MD  EPINEPHrine 0.3 mg/0.3 mL IJ SOAJ injection as directed Injection    [provider]  epoetin alfa-epbx (RETACRIT) 2000 UNIT/ML injection ? dose as directed per hematology Injection every 3 weeks.    [provider]  ezetimibe (  ZETIA) 10 MG tablet Take 1 tablet (10 mg total) by mouth daily. 06/13/21   Angiulli, Lavon Paganini, PA-C  finasteride (PROSCAR) 5 MG tablet Take 1 tablet (5 mg total) by mouth daily. 06/13/21   Angiulli, Lavon Paganini, PA-C  fish oil-omega-3 fatty acids 1000 MG capsule Take 1 capsule (1 g total) by mouth daily. 06/13/21   Angiulli, Lavon Paganini, PA-C  fluticasone (FLONASE) 50 MCG/ACT nasal spray as needed. Patient not taking: Reported on 10/19/2021    [provider]  furosemide (LASIX) 40 MG tablet 1 tablet Orally Twice a day    [provider]  gabapentin (NEURONTIN) 100 MG capsule  Take 2 capsules by mouth in the morning, 1 capsule in the evening, and 2 capsules at bedtime. 06/13/21   Angiulli, Lavon Paganini, PA-C  galantamine (RAZADYNE ER) 8 MG 24 hr capsule Take 1 capsule (8 mg total) by mouth daily with breakfast. 06/13/21   Angiulli, Lavon Paganini, PA-C  isosorbide mononitrate (IMDUR) 60 MG 24 hr tablet Take 1 tablet (60 mg total) by mouth daily. 06/13/21   Angiulli, Lavon Paganini, PA-C  leflunomide (ARAVA) 10 MG tablet     [provider]  leflunomide (ARAVA) 20 MG tablet Take 1/2 tablets (10 mg total) by mouth daily. 06/13/21   Angiulli, Lavon Paganini, PA-C  Magnesium 250 MG TABS Take 1 tablet (250 mg total) by mouth daily. 06/13/21   Angiulli, Lavon Paganini, PA-C  memantine (NAMENDA) 10 MG tablet Take 1 tablet (10 mg total) by mouth 2 (two) times daily. 06/13/21   Angiulli, Lavon Paganini, PA-C  memantine (NAMENDA) 5 MG tablet     [provider]  metoprolol succinate (TOPROL-XL) 25 MG 24 hr tablet     [provider]  naftifine (NAFTIN) 1 % cream Apply 1 application  topically daily as needed (fungal skin infection). Patient not taking: Reported on 10/19/2021    [provider]  nitroGLYCERIN (NITROSTAT) 0.4 MG SL tablet Place 1 tablet (0.4 mg total) under the tongue every 5 (five) minutes x 3 doses as needed for chest pain. 12/16/19   Cantwell, Celeste C, PA-C  pantoprazole (PROTONIX) 40 MG tablet 1 tablet EVERY day Oral Once a day for 90 days    [provider]  polyethylene glycol (MIRALAX) 17 g packet 1 packet mixed with 8 ounces of fluid Orally Once a day for 30 day(s)    [provider]  potassium chloride (KLOR-CON M) 10 MEQ tablet Take 1 tablet by mouth everyday with furosemide 06/13/21   Angiulli, Lavon Paganini, PA-C  predniSONE (DELTASONE) 1 MG tablet Take 3 tablets (3 mg total) by mouth daily with breakfast. 06/13/21   Angiulli, Lavon Paganini, PA-C  Psyllium Husk POWD Take 1 Scoop by mouth daily.     [provider]  tamsulosin (FLOMAX) 0.4 MG  CAPS capsule Take 1 capsule (0.4 mg total) by mouth daily after supper. 06/13/21   Angiulli, Lavon Paganini, PA-C  torsemide (DEMADEX) 20 MG tablet Take 2 tablets (40 mg total) by mouth daily. Patient not taking: Reported on 10/19/2021 06/13/21   Angiulli, Lavon Paganini, PA-C  traMADol (ULTRAM) 50 MG tablet Take 1/2 tablets (25 mg total) by mouth every 6 (six) hours as needed for moderate pain. 10/19/21   Kirsteins, Luanna Salk, MD  triamcinolone cream (KENALOG) 0.1 % 1 application External 2-3 times a day for up to 14 days for 14 days Patient not taking: Reported on 10/19/2021    [provider]  vitamin C (ASCORBIC ACID)  500 MG tablet Take 1 tablet (500 mg total) by mouth daily. 06/13/21   Angiulli, Lavon Paganini, PA-C  potassium chloride (MICRO-K) 10 MEQ CR capsule TAKE ONE CAPSULE BY MOUTH EVERY DAY WITH LASIX Patient taking differently: Take 10 mEq by mouth daily. 02/16/21 06/13/21  Cantwell, Celeste C, PA-C      Allergies    Amoxicillin, Bee venom, Avelox [moxifloxacin], Codeine, Duloxetine, Duloxetine hcl, Levitra [vardenafil], Morphine, Morphine and related, Penicillin g sodium, Testosterone, Tizanidine, and Viagra [sildenafil]    Review of Systems   Review of Systems  Constitutional:  Negative for chills and fever.  HENT: Negative.    Respiratory: Negative.    Cardiovascular:  Positive for leg swelling.  Skin:  Positive for wound.    Physical Exam Updated Vital Signs BP (!) 166/72   Pulse (!) 58   Temp 97.8 F (36.6 C) (Oral)   Resp 14   Ht '6\' 2"'$  (1.88 m)   Wt 98 kg   SpO2 100%   BMI 27.73 kg/m  Physical Exam Vitals and nursing note reviewed.  Constitutional:      General: He is not in acute distress.    Appearance: Normal appearance. He is well-developed. He is ill-appearing. He is not diaphoretic.     Comments: Elderly male, chronically ill appearing. Very HOH  HENT:     Head: Normocephalic and atraumatic.  Eyes:     General:        Right eye: No discharge.        Left eye:  No discharge.  Cardiovascular:     Rate and Rhythm: Normal rate and regular rhythm.     Pulses: Normal pulses.     Heart sounds: Normal heart sounds.  Pulmonary:     Effort: Pulmonary effort is normal. No respiratory distress.     Breath sounds: Normal breath sounds. No wheezing or rales.     Comments: Respirations equal and unlabored, patient able to speak in full sentences, lungs clear to auscultation bilaterally  Abdominal:     General: Bowel sounds are normal. There is no distension.     Palpations: Abdomen is soft. There is no mass.     Tenderness: There is no abdominal tenderness. There is no guarding.     Comments: Abdomen soft, nondistended, nontender to palpation in all quadrants without guarding or peritoneal signs  Musculoskeletal:     Cervical back: Neck supple.     Comments: Wounds to bilateral second toes as pictured below, some redness to proximal left foot. Wound to right heel as pictured below. DP and pt pulses confirmed with doppler, faint but equal  Skin:    General: Skin is warm and dry.     Capillary Refill: Capillary refill takes less than 2 seconds.  Neurological:     Mental Status: He is alert and oriented to person, place, and time.     Coordination: Coordination normal.     Comments: Speech is clear, able to follow commands Moves extremities without ataxia, coordination intact  Psychiatric:        Mood and Affect: Mood normal.        Behavior: Behavior normal.         ED Results / Procedures / Treatments   Labs (all labs ordered are listed, but only abnormal results are displayed) Labs Reviewed  CBC WITH DIFFERENTIAL/PLATELET - Abnormal; Notable for the following components:      Result Value   RBC 3.25 (*)    Hemoglobin 9.2 (*)  HCT 30.7 (*)    RDW 16.1 (*)    Platelets 149 (*)    All other components within normal limits  CULTURE, BLOOD (ROUTINE X 2)  CULTURE, BLOOD (ROUTINE X 2)  COMPREHENSIVE METABOLIC PANEL  LACTIC ACID, PLASMA  LACTIC  ACID, PLASMA  PROTIME-INR  URINALYSIS, ROUTINE W REFLEX MICROSCOPIC    EKG None  Radiology  DG Foot Complete Right  Result Date: 11/02/2021 CLINICAL DATA:  Infection.  Second toe. EXAM: RIGHT FOOT COMPLETE - 3+ VIEW COMPARISON:  None Available. FINDINGS: There is a soft tissue abnormality along the tip of the second toe distal phalanx. There is no definite evidence of cortical irregularity on the provided views. No evidence of subcutaneous gas. There are enthesopathic changes at the calcaneus. There are multifocal degenerative changes at the forefoot and hindfoot. Vascular calcifications are present. IMPRESSION: Soft tissue abnormality along the tip of the second toe distal phalanx without definite evidence of cortical irregularity. However, given clinical concerns for osseous extension, osteomyelitis is not excluded. Consider further evaluation with MRI if more definitive characterization is clinically warranted. Electronically Signed   By: Marin Roberts M.D.   On: 11/02/2021 12:41   DG Foot Complete Left  Result Date: 11/02/2021 CLINICAL DATA:  Second toe wound. EXAM: LEFT FOOT - COMPLETE 3+ VIEW COMPARISON:  October 05, 2021. FINDINGS: There is no evidence of fracture or dislocation. There is no evidence of arthropathy or other focal bone abnormality. Soft tissues are unremarkable. IMPRESSION: No acute abnormality seen in the left foot. No definite lytic destruction is seen to suggest osteomyelitis. Electronically Signed   By: Marijo Conception M.D.   On: 11/02/2021 11:54   DG Chest 2 View  Result Date: 11/02/2021 CLINICAL DATA:  Possible sepsis with second toe wound, initial encounter EXAM: CHEST - 2 VIEW COMPARISON:  10/22/2020 FINDINGS: Cardiac shadow is stable. Postsurgical changes are noted as well as prior TAVR. The lungs are well aerated bilaterally. No focal infiltrate or sizable effusion is noted. Changes of prior vertebral augmentation at L1 are noted. IMPRESSION: No active  cardiopulmonary disease. Electronically Signed   By: Inez Catalina M.D.   On: 11/02/2021 11:16   Procedures Procedures    Medications Ordered in ED Medications - No data to display  ED Course/ Medical Decision Making/ A&P                           Medical Decision Making Amount and/or Complexity of Data Reviewed Labs: ordered. Radiology: ordered.  Risk Decision regarding hospitalization.   88 y.o. male presents to the ED with complaints of foot wound, this involves an extensive number of treatment options, and is a complaint that carries with it a high risk of complications and morbidity.  The differential diagnosis includes wound infection, osteomyelitis, vascular occlusion   On arrival pt is nontoxic, vitals WNL.  Additional history obtained from wife at bedside. Previous records obtained and reviewed including wound care notes  Lab Tests:  I Ordered, reviewed, and interpreted labs, which included: no leukocytosis, stable anemia, stable chronicly elevated Cr, no significant electrolyte derangements  Imaging Studies ordered:  I ordered imaging studies which included CXR, and X-rays of bilateral feet, ABIs, I independently visualized and interpreted imaging which showed no current evidence of osteomyelitis on XR, ABIs pending.  ED Course:   Fortunately no current evidence of osteo on x-ray but given worsening wounds not responding to wound care interventions. Consult placed to orthopedics. ABIs  pending, pulses present so will hold off on emergent consult to vascular.  Case discussed with Dr. Stann Mainland with Ortho, including pertinent labs and imaging, agrees with plan for admission and he will contact Dr. Sharol Given to see and evaluate pt for possible amputation. Ortho recommends holding off on ABX for now.  Hospitalists consulted for admission. Case discussed with Dr. Fuller Plan who will see and admit the patient.    Portions of this note were generated with Theatre manager. Dictation errors may occur despite best attempts at proofreading.         Final Clinical Impression(s) / ED Diagnoses Final diagnoses:  Wound infection    Rx / DC Orders ED Discharge Orders     None         Janet Berlin 11/15/21 2005    Cristie Hem, MD 11/15/21 2032

## 2021-11-02 NOTE — ED Notes (Signed)
Provider at bedside

## 2021-11-02 NOTE — Progress Notes (Signed)
ANTICOAGULATION CONSULT NOTE - Initial Consult  Pharmacy Consult for Heparin Indication: atrial fibrillation  Allergies  Allergen Reactions   Amoxicillin Other (See Comments)    Headache, debilitated   Bee Venom Anaphylaxis   Avelox [Moxifloxacin] Other (See Comments)    Unknown   Codeine Other (See Comments)   Duloxetine     Other reaction(s): Unknown   Duloxetine Hcl Other (See Comments)    felt weird   Levitra [Vardenafil] Other (See Comments)    Unknown   Morphine Other (See Comments)   Morphine And Related Other (See Comments)    hypotension   Penicillin G Sodium Other (See Comments)    Severe headache, fatigue "debilitated"   Testosterone Other (See Comments)    unknown   Tizanidine Other (See Comments)    unknown Other reaction(s): Not available   Viagra [Sildenafil] Other (See Comments)    didn't like it    Patient Measurements: Height: '6\' 2"'$  (188 cm) Weight: 98 kg (216 lb) IBW/kg (Calculated) : 82.2 Heparin Dosing Weight: 98 kg  Vital Signs: Temp: 97.8 F (36.6 C) (11/02 1047) Temp Source: Oral (11/02 1047) BP: 163/67 (11/02 1315) Pulse Rate: 57 (11/02 1315)  Labs: Recent Labs    11/02/21 1212  HGB 9.2*  HCT 30.7*  PLT 149*  LABPROT 13.9  INR 1.1  CREATININE 1.58*    Estimated Creatinine Clearance: 34 mL/min (A) (by C-G formula based on SCr of 1.58 mg/dL (H)).   Medical History: Past Medical History:  Diagnosis Date   AAA (abdominal aortic aneurysm) (HCC)    Arthritis    Ascending aorta dilatation ([place]) 03/13/2012   Fusiform dilatation of the ascending thoracic aorta discovered during surgery, max diameter 4.2-4.3 cm   BPH (benign prostatic hypertrophy)    Chronic systolic heart failure ([place]) 03/03/2012   Chronic venous insufficiency    GERD (gastroesophageal reflux disease)    Glaucoma    Gout    History of kidney stones    Hyperlipidemia    Hypertension    Obesity (BMI 30-39.9)    Paroxysmal atrial fibrillation (HCC) 02/29/2012    Recurrent paroxysmal, new-onset    PMR (polymyalgia rheumatica) (HCC)    Polymyalgia rheumatica (HCC) 02/06/2019   Pre-diabetes    S/P CABG x 3 03/06/2012   LIMA to LAD, SVG to OM, SVG to RCA, EVH via right thigh   S/P Maze operation for atrial fibrillation 03/06/2012   Complete bilateral lesions set using bipolar radiofrequency and cryothermy ablation with clipping of LA appendage   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   s/p TAVR with a 26 mm Edwards via the left subclavian by Drs Buena Irish and Bartle   Severe aortic stenosis 09/29/2019   Sleep apnea    USES CPAP NIGHTLY    Medications:  (Not in a hospital admission)  Scheduled:   sodium chloride flush  3 mL Intravenous Q12H   Infusions:  PRN: acetaminophen **OR** acetaminophen, albuterol, hydrALAZINE  Assessment: 4 yom with a history of HTN, HLD, CAD s/p CABG, AF s/p maze on apixaban, HF, s/p TAVR, CVA, PVD, AAA, OSA on CPAP. Patient is presenting with wound infection. Heparin per pharmacy consult placed for atrial fibrillation.  Patient is on apixaban prior to arrival. Last dose 11/2 am. Will require aPTT monitoring due to likely falsely high anti-Xa level secondary to DOAC use.  Hgb 9.2; plt 149 PT/INR 13.9/1.1 Scr 1.58  Goal of Therapy:  Heparin level 0.3-0.7 units/ml aPTT 66-102 seconds Monitor platelets by anticoagulation protocol: Yes  Plan:  No initial heparin bolus Start heparin infusion at 1400 units/hr at 9pm tonight Check aPTT & anti-Xa level at 0500 on 11/3 and daily while on heparin Continue to monitor via aPTT until levels are correlated Continue to monitor H&H and platelets  Lorelei Pont, PharmD, BCPS 11/02/2021 1:53 PM ED Clinical Pharmacist -  [phone]

## 2021-11-02 NOTE — H&P (Signed)
History and Physical    Patient: [name] [name] MRN:[id number] DOB: 08/22/1926 DOA: 11/02/2021 DOS: the patient was seen and examined on 11/02/2021 PCP: Perini, Mark, MD  Patient coming from: Wound care clinic  Chief Complaint:  Chief Complaint  Patient presents with   Wound Infection   HPI: [name] [name] is a 88 y.o. male with medical history significant of hypertension, hyperlipidemia, CAD s/p CABG, PAF s/p maze on chronic anticoagulation, systolic CHF, severe AS s/p TAVR, CVA with residual deficit, PVD, PMR, AAA, OSA on CPAP who presents for worsening wounds of his feet.  He was seen at wound care yesterday and noted to have worsening wound of his left second toe which he was sent to the hospital for further evaluation.  He also has a wound of his right heel and they have been monitoring.  His wife notes that since his stroke in May he has had some difficulty with spelling and intermittently drops things out of his right hand.   Patient has been having a difficult time standing and assisting with transfers.  He has not had any significant fever, nausea, vomiting, abdominal pain, or diarrhea.  He is incontinent of urine and wears adult briefs.  In the emergency department patient was noted to be bradycardic with blood pressures 98/75 to 168/68, and all other vital signs maintained.  Labs noted WBC 5.1, hemoglobin 9.2, platelets 149, BUN 28, and creatinine 1.58.  X-rays of the left foot did not note any acute osseous abnormality.  Chest x-ray did not note any acute abnormality.  Dr. Rogers of orthopedics was consulted and notified Dr. Duda of the patient being admitted to the hospital.  ABIs with TBI been ordered.  Blood cultures have been obtained. Review of Systems: As mentioned in the history of present illness. All other systems reviewed and are negative. Past Medical History:  Diagnosis Date   AAA (abdominal aortic aneurysm) (HCC)    Arthritis    Ascending aorta dilatation (HCC) 03/13/2012    Fusiform dilatation of the ascending thoracic aorta discovered during surgery, max diameter 4.2-4.3 cm   BPH (benign prostatic hypertrophy)    Chronic systolic heart failure (HCC) 03/03/2012   Chronic venous insufficiency    GERD (gastroesophageal reflux disease)    Glaucoma    Gout    History of kidney stones    Hyperlipidemia    Hypertension    Obesity (BMI 30-39.9)    Paroxysmal atrial fibrillation (HCC) 02/29/2012   Recurrent paroxysmal, new-onset    PMR (polymyalgia rheumatica) (HCC)    Polymyalgia rheumatica (HCC) 02/06/2019   Pre-diabetes    S/P CABG x 3 03/06/2012   LIMA to LAD, SVG to OM, SVG to RCA, EVH via right thigh   S/P Maze operation for atrial fibrillation 03/06/2012   Complete bilateral lesions set using bipolar radiofrequency and cryothermy ablation with clipping of LA appendage   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   s/p TAVR with a 26 mm Edwards via the left subclavian by Drs McAlhnay and Bartle   Severe aortic stenosis 09/29/2019   Sleep apnea    USES CPAP NIGHTLY   Past Surgical History:  Procedure Laterality Date   ABDOMINAL AORTOGRAM W/LOWER EXTREMITY N/A 10/26/2020   Procedure: ABDOMINAL AORTOGRAM W/LOWER EXTREMITY;  Surgeon: Robins, Joshua E, MD;  Location: [facility];  Service: Cardiovascular;  Laterality: N/A;   CARDIAC CATHETERIZATION     CATARACT EXTRACTION, BILATERAL     with lens implants   CHOLECYSTECTOMY N/A   03/24/2012   Procedure: LAPAROSCOPIC CHOLECYSTECTOMY WITH INTRAOPERATIVE CHOLANGIOGRAM;  Surgeon: Zenovia Jarred, MD;  Location: [place];  Service: General;  Laterality: N/A;   CORONARY ARTERY BYPASS GRAFT N/A 03/06/2012   Procedure: CORONARY ARTERY BYPASS GRAFTING (CABG);  Surgeon: Rexene Alberts, MD;  Location: [place];  Service: Open Heart Surgery;  Laterality: N/A;   ENDOVEIN HARVEST OF GREATER SAPHENOUS VEIN Right 03/06/2012   Procedure: ENDOVEIN HARVEST OF GREATER SAPHENOUS VEIN;  Surgeon: Rexene Alberts,  MD;  Location: [place];  Service: Open Heart Surgery;  Laterality: Right;   INTRAOPERATIVE TRANSESOPHAGEAL ECHOCARDIOGRAM N/A 03/06/2012   Procedure: INTRAOPERATIVE TRANSESOPHAGEAL ECHOCARDIOGRAM;  Surgeon: Rexene Alberts, MD;  Location: [place];  Service: Open Heart Surgery;  Laterality: N/A;   IR KYPHO LUMBAR INC FX REDUCE BONE BX UNI/BIL CANNULATION INC/IMAGING  02/23/2020   LEFT HEART CATH Right 03/02/2012   Procedure: LEFT HEART CATH;  Surgeon: Sherren Mocha, MD;  Location: [facility];  Service: Cardiovascular;  Laterality: Right;   MAZE N/A 03/06/2012   Procedure: MAZE;  Surgeon: Rexene Alberts, MD;  Location: [place];  Service: Open Heart Surgery;  Laterality: N/A;   PERIPHERAL VASCULAR INTERVENTION Right 10/26/2020   Procedure: PERIPHERAL VASCULAR INTERVENTION;  Surgeon: Broadus John, MD;  Location: [place] [facility];  Service: Cardiovascular;  Laterality: Right;   PROSTATECTOMY     partial   RIGHT/LEFT HEART CATH AND CORONARY/GRAFT ANGIOGRAPHY N/A 09/29/2019   Procedure: RIGHT/LEFT HEART CATH AND CORONARY/GRAFT ANGIOGRAPHY;  Surgeon: Adrian Prows, MD;  Location: [place] [facility];  Service: Cardiovascular;  Laterality: N/A;   TEE WITHOUT CARDIOVERSION N/A 11/10/2019   Procedure: TRANSESOPHAGEAL ECHOCARDIOGRAM (TEE);  Surgeon: Burnell Blanks, MD;  Location: [place];  Service: Open Heart Surgery;  Laterality: N/A;   TRANSCATHETER AORTIC VALVE REPLACEMENT, TRANSFEMORAL  11/10/2019   Social History:  reports that he quit smoking about 58 years ago. His smoking use included cigarettes. He has a 7.50 pack-year smoking history. He has never been exposed to tobacco smoke. He has never used smokeless tobacco. He reports current alcohol use of about 2.0 standard drinks of alcohol per week. He reports that he does not use drugs.  Allergies  Allergen Reactions   Amoxicillin Other (See Comments)    Headache, debilitated   Bee Venom Anaphylaxis   Avelox [Moxifloxacin] Other (See Comments)     Unknown   Codeine Other (See Comments)   Duloxetine     Other reaction(s): Unknown   Duloxetine Hcl Other (See Comments)    felt weird   Levitra [Vardenafil] Other (See Comments)    Unknown   Morphine Other (See Comments)   Morphine And Related Other (See Comments)    hypotension   Penicillin G Sodium Other (See Comments)    Severe headache, fatigue "debilitated"   Testosterone Other (See Comments)    unknown   Tizanidine Other (See Comments)    unknown Other reaction(s): Not available   Viagra [Sildenafil] Other (See Comments)    didn't like it    Family History  Problem Relation Age of Onset   Dementia Mother    Stroke Brother     Prior to Admission medications   Medication Sig Start Date End Date Taking? Authorizing Provider  acetaminophen (TYLENOL) 325 MG tablet Take 2 tablets (650 mg total) by mouth every 4 (four) hours as needed for mild pain (or temp > 37.5 C (99.5 F)). 06/13/21   Angiulli, Lavon Paganini, PA-C  Acetaminophen 500 MG capsule 2-3  capsules as needed Orally every 6 hrs    [provider]  amiodarone (PACERONE) 200 MG tablet Take 1 tablet (200 mg total) by mouth daily. 06/13/21   Angiulli, Daniel J, PA-C  amLODipine (NORVASC) 10 MG tablet 1 tablet Orally Once a day Patient not taking: Reported on 10/19/2021    [provider]  apixaban (ELIQUIS) 2.5 MG TABS tablet take one twice a day Orally twice a day for 90 days    [provider]  aspirin EC 81 MG tablet     [provider]  atorvastatin (LIPITOR) 40 MG tablet 1 tablet Orally Once a day    [provider]  cadexomer iodine (IODOSORB) 0.9 % gel Apply 1 application. topically every other day as needed for wound care.    [provider]  cholecalciferol (VITAMIN D3) 25 MCG (1000 UNIT) tablet Take 1 tablet (1,000 Units total) by mouth daily. 06/13/21   Angiulli, Daniel J, PA-C  collagenase (SANTYL) 250 UNIT/GM ointment     [provider]  Cyanocobalamin  (VITAMIN B-12) 5000 MCG TBDP Take 5,000 mcg by mouth daily. 06/13/21   Angiulli, Daniel J, PA-C  cyclobenzaprine (FLEXERIL) 5 MG tablet Take 1 tablet (5 mg total) by mouth 3 (three) times daily as needed for muscle spasms. 10/19/21   Kirsteins, Andrew E, MD  denosumab (PROLIA) 60 MG/ML SOSY injection Inject 60 mg into the skin every 6 (six) months.    [provider]  docusate sodium (COLACE) 100 MG capsule Take 1 capsule (100 mg total) by mouth 2 (two) times daily. Patient taking differently: Take 100 mg by mouth every evening. 08/23/20   Hongalgi, Anand D, MD  EPINEPHrine 0.3 mg/0.3 mL IJ SOAJ injection as directed Injection    [provider]  epoetin alfa-epbx (RETACRIT) 2000 UNIT/ML injection ? dose as directed per hematology Injection every 3 weeks.    [provider]  ezetimibe (ZETIA) 10 MG tablet Take 1 tablet (10 mg total) by mouth daily. 06/13/21   Angiulli, Daniel J, PA-C  finasteride (PROSCAR) 5 MG tablet Take 1 tablet (5 mg total) by mouth daily. 06/13/21   Angiulli, Daniel J, PA-C  fish oil-omega-3 fatty acids 1000 MG capsule Take 1 capsule (1 g total) by mouth daily. 06/13/21   Angiulli, Daniel J, PA-C  fluticasone (FLONASE) 50 MCG/ACT nasal spray as needed. Patient not taking: Reported on 10/19/2021    [provider]  furosemide (LASIX) 40 MG tablet 1 tablet Orally Twice a day    [provider]  gabapentin (NEURONTIN) 100 MG capsule Take 2 capsules by mouth in the morning, 1 capsule in the evening, and 2 capsules at bedtime. 06/13/21   Angiulli, Daniel J, PA-C  galantamine (RAZADYNE ER) 8 MG 24 hr capsule Take 1 capsule (8 mg total) by mouth daily with breakfast. 06/13/21   Angiulli, Daniel J, PA-C  isosorbide mononitrate (IMDUR) 60 MG 24 hr tablet Take 1 tablet (60 mg total) by mouth daily. 06/13/21   Angiulli, Daniel J, PA-C  leflunomide (ARAVA) 10 MG tablet     [provider]  leflunomide (ARAVA) 20 MG tablet Take 1/2 tablets (10  mg total) by mouth daily. 06/13/21   Angiulli, Daniel J, PA-C  Magnesium 250 MG TABS Take 1 tablet (250 mg total) by mouth daily. 06/13/21   Angiulli, Daniel J, PA-C  memantine (NAMENDA) 10 MG tablet Take 1 tablet (10 mg total) by mouth 2 (two) times daily. 06/13/21   Angiulli, Daniel J, PA-C    memantine (NAMENDA) 5 MG tablet     [provider]  metoprolol succinate (TOPROL-XL) 25 MG 24 hr tablet     [provider]  naftifine (NAFTIN) 1 % cream Apply 1 application  topically daily as needed (fungal skin infection). Patient not taking: Reported on 10/19/2021    [provider]  nitroGLYCERIN (NITROSTAT) 0.4 MG SL tablet Place 1 tablet (0.4 mg total) under the tongue every 5 (five) minutes x 3 doses as needed for chest pain. 12/16/19   Cantwell, Celeste C, PA-C  pantoprazole (PROTONIX) 40 MG tablet 1 tablet EVERY day Oral Once a day for 90 days    [provider]  polyethylene glycol (MIRALAX) 17 g packet 1 packet mixed with 8 ounces of fluid Orally Once a day for 30 day(s)    [provider]  potassium chloride (KLOR-CON M) 10 MEQ tablet Take 1 tablet by mouth everyday with furosemide 06/13/21   Angiulli, Lavon Paganini, PA-C  predniSONE (DELTASONE) 1 MG tablet Take 3 tablets (3 mg total) by mouth daily with breakfast. 06/13/21   Angiulli, Lavon Paganini, PA-C  Psyllium Husk POWD Take 1 Scoop by mouth daily.     [provider]  tamsulosin (FLOMAX) 0.4 MG CAPS capsule Take 1 capsule (0.4 mg total) by mouth daily after supper. 06/13/21   Angiulli, Lavon Paganini, PA-C  torsemide (DEMADEX) 20 MG tablet Take 2 tablets (40 mg total) by mouth daily. Patient not taking: Reported on 10/19/2021 06/13/21   Angiulli, Lavon Paganini, PA-C  traMADol (ULTRAM) 50 MG tablet Take 1/2 tablets (25 mg total) by mouth every 6 (six) hours as needed for moderate pain. 10/19/21   Kirsteins, Luanna Salk, MD  triamcinolone cream (KENALOG) 0.1 % 1 application External 2-3 times a day for up to 14 days for  14 days Patient not taking: Reported on 10/19/2021    [provider]  vitamin C (ASCORBIC ACID) 500 MG tablet Take 1 tablet (500 mg total) by mouth daily. 06/13/21   Angiulli, Lavon Paganini, PA-C  potassium chloride (MICRO-K) 10 MEQ CR capsule TAKE ONE CAPSULE BY MOUTH EVERY DAY WITH LASIX Patient taking differently: Take 10 mEq by mouth daily. 02/16/21 06/13/21  Alethia Berthold, PA-C    Physical Exam: Vitals:   11/02/21 1208 11/02/21 1215 11/02/21 1245 11/02/21 1315  BP:  (!) 168/68  (!) 163/67  Pulse:  (!) 57 (!) 58 (!) 57  Resp:  _0 Temp:      TempSrc:      SpO2: 100% 100% 100% 99%  Weight:      Height:       Exam  Constitutional: Elderly male currently no acute distress Eyes: PERRL, lids and conjunctivae normal ENMT: Mucous membranes are moist.    Neck: normal, supple  Respiratory: clear to auscultation bilaterally, no wheezing, no crackles. Normal respiratory effort. No accessory muscle use.  Cardiovascular: Regular rate and rhythm, no murmurs / rubs / gallops.  1+ pitting bilateral lower extremity edema.  Pulses appreciated with Doppler. Abdomen: no tenderness, no masses palpated.  Bowel sounds positive.  Musculoskeletal: No cyanosis appreciated. Skin: Coarse breath grocer of the right heel with ulcer noted on the second digit of the right and left foot.  Wound on left second toe appears worse than the right.  Status post Mohs procedure of the left shin appears to be healing.  Neurologic: CN 2-12 grossly intact.  Patient reports decreased sensation on the left foot. Psychiatric: Normal judgment and insight. Alert  and oriented x 3. Normal mood.   Data Reviewed:   Reviewed labs, imaging and pertinent records as noted above in HPI.   Assessment and Plan: Skin ulcers of feet, bilateral Acute on chronic.  Patient noted to have progressive worsening of bilateral lower second toe ulcerations worse on the left, and a significant pressure ulcer of the right heel  which has been followed by wound care.  X-ray was negative for any clear signs of osteomyelitis.  Repeat ABI with TBI's had been ordered.  Orthopedics have been consulted recommended keeping patient n.p.o. after midnight for possible need of amputation in a.m. -Admit to a medical telemetry bed -Follow-up blood cultures -Add on ESR and CRP -Follow-up ABI with TBI -Holding off antibiotics at this time -Orthopedics consulted, will follow-up for any further recommendations.  Dr. Sharol Given scheduled to see in a.m.  PAF on chronic anticoagulation -Heparin per pharmacy  Essential hypertension Blood pressure currently maintained. -Resume home medication regimen once med reconciliation completed  Diastolic congestive heart failure Chronic.  Patient appears to be compensated at this time.  Last EF noted to be 65 -70% with indeterminate diastolic parameters back in 05/2021. -Continue monitor  CAD S/p CABG -Continue Imdur and statin  PAD Status post angioplasty of the right posterior tibial artery 10/2020. -Continue atorvastatin  History of CVA with residual deficit Patient suffered left middle cerebral artery stroke in 05/2021 with residual right hand weakness and difficulty speaking. -Continue  CKD stage IIIb Creatinine 1.58 which appears near patient's baseline. -Continue to monitor  History of rheumatoid arthritis/PMR  Dementia Dementia noted to be mild. -Continue Namenda and galantamine  OSA -Continue CPAP at night   DVT prophylaxis: Heparin Advance Care Planning:   Code Status: DNR   Consults: Orthopedics and vascular surgery  Family Communication: Wife at the  Severity of Illness: The appropriate patient status for this patient is INPATIENT. Inpatient status is judged to be reasonable and necessary in order to provide the required intensity of service to ensure the patient's safety. The patient's presenting symptoms, physical exam findings, and initial radiographic and  laboratory data in the context of their chronic comorbidities is felt to place them at high risk for further clinical deterioration. Furthermore, it is not anticipated that the patient will be medically stable for discharge from the hospital within 2 midnights of admission.   * I certify that at the point of admission it is my clinical judgment that the patient will require inpatient hospital care spanning beyond 2 midnights from the point of admission due to high intensity of service, high risk for further deterioration and high frequency of surveillance required.*  Author: Norval Morton, MD 11/02/2021 1:29 PM  For on call review [email or web address].

## 2021-11-02 NOTE — Consult Note (Addendum)
[facility] [place] CONSULT NOTE   MRN : [id number]  Reason for Consult: He was seen at wound care yesterday and noted to have worsening wound of his second toe, worsening left second toe wound.  Referring Physician: ED  History of Present Illness: [name] [name] is a 88 y.o. male with  recent history of non healing heel wound and right 2nd toe wound requiring angiogram with PT angioplasty by Dr. Virl Cagey.  He also has a left second toe non healing wound.  The left LE was not imaged during the angiogram 10/26/20.  Prior ABI's on 03/24/21 showed left LE with biphasic wave forms and 1.19 index.  He was last seen by our office 03/24/21 He had normal toe pressures and at the time the wounds were healing.  He was scheduled to return for discussion of angiogram of the left LE with CO2 due to CKD.  After seeing dr. Virl Cagey the plan was for continued local wound care with good perfusion to heal the wounds without further intervention at the time.  He states the left second toe wound has been present for around 5 months.  He was sent to the ED by wound care with reports of deterioration.  He is afebrile without leukocytosis.   The pt is non ambulatory secondary to chronic vertebral body compression.   He also continues to struggle with bilateral lower extremity lymphedema as well as neuropathy.   He has a PMH significant for CKD stage II, Hypertension, CHF, afib, hyperlipidemia, Obesity, chronic urethral obstruction with indwelling foley catheter, AS s/p TAVR, CAD with hx of CABG.        Current Facility-Administered Medications  Medication Dose Route Frequency Provider Last Rate Last Admin   acetaminophen (TYLENOL) tablet 650 mg  650 mg Oral Q6H PRN Norval Morton, MD       Or   acetaminophen (TYLENOL) suppository 650 mg  650 mg Rectal Q6H PRN Fuller Plan A, MD       albuterol (PROVENTIL) (2.5 MG/3ML) 0.083% nebulizer solution 2.5 mg  2.5 mg Nebulization Q6H PRN Tamala Julian, Rondell A, MD        heparin ADULT infusion 100 units/mL (25000 units/248m)  1,400 Units/hr Intravenous Continuous Smith, Rondell A, MD       hydrALAZINE (APRESOLINE) injection 10 mg  10 mg Intravenous Q4H PRN Smith, Rondell A, MD       sodium chloride flush (NS) 0.9 % injection 3 mL  3 mL Intravenous Q12H SNorval Morton MD       Current Outpatient Medications  Medication Sig Dispense Refill   acetaminophen (TYLENOL) 325 MG tablet Take 2 tablets (650 mg total) by mouth every 4 (four) hours as needed for mild pain (or temp > 37.5 C (99.5 F)).     Acetaminophen 500 MG capsule 2-3 capsules as needed Orally every 6 hrs     amiodarone (PACERONE) 200 MG tablet Take 1 tablet (200 mg total) by mouth daily. 30 tablet 0   amLODipine (NORVASC) 10 MG tablet 1 tablet Orally Once a day (Patient not taking: Reported on 10/19/2021)     apixaban (ELIQUIS) 2.5 MG TABS tablet take one twice a day Orally twice a day for 90 days     aspirin EC 81 MG tablet      atorvastatin (LIPITOR) 40 MG tablet 1 tablet Orally Once a day     cadexomer iodine (IODOSORB) 0.9 % gel Apply 1 application. topically every other day as needed for wound  care.     cholecalciferol (VITAMIN D3) 25 MCG (1000 UNIT) tablet Take 1 tablet (1,000 Units total) by mouth daily. 30 tablet 0   collagenase (SANTYL) 250 UNIT/GM ointment      Cyanocobalamin (VITAMIN B-12) 5000 MCG TBDP Take 5,000 mcg by mouth daily. 30 tablet 0   cyclobenzaprine (FLEXERIL) 5 MG tablet Take 1 tablet (5 mg total) by mouth 3 (three) times daily as needed for muscle spasms. 30 tablet 0   denosumab (PROLIA) 60 MG/ML SOSY injection Inject 60 mg into the skin every 6 (six) months.     docusate sodium (COLACE) 100 MG capsule Take 1 capsule (100 mg total) by mouth 2 (two) times daily. (Patient taking differently: Take 100 mg by mouth every evening.)     EPINEPHrine 0.3 mg/0.3 mL IJ SOAJ injection as directed Injection     epoetin alfa-epbx (RETACRIT) 2000 UNIT/ML injection ? dose as directed  per hematology Injection every 3 weeks.     ezetimibe (ZETIA) 10 MG tablet Take 1 tablet (10 mg total) by mouth daily. 30 tablet 0   finasteride (PROSCAR) 5 MG tablet Take 1 tablet (5 mg total) by mouth daily. 30 tablet 0   fish oil-omega-3 fatty acids 1000 MG capsule Take 1 capsule (1 g total) by mouth daily. 30 capsule 0   fluticasone (FLONASE) 50 MCG/ACT nasal spray as needed. (Patient not taking: Reported on 10/19/2021)     furosemide (LASIX) 40 MG tablet 1 tablet Orally Twice a day     gabapentin (NEURONTIN) 100 MG capsule Take 2 capsules by mouth in the morning, 1 capsule in the evening, and 2 capsules at bedtime. 90 capsule 0   galantamine (RAZADYNE ER) 8 MG 24 hr capsule Take 1 capsule (8 mg total) by mouth daily with breakfast. 30 capsule 0   isosorbide mononitrate (IMDUR) 60 MG 24 hr tablet Take 1 tablet (60 mg total) by mouth daily. 30 tablet 0   leflunomide (ARAVA) 10 MG tablet      leflunomide (ARAVA) 20 MG tablet Take 1/2 tablets (10 mg total) by mouth daily. 30 tablet 0   Magnesium 250 MG TABS Take 1 tablet (250 mg total) by mouth daily. 30 tablet 0   memantine (NAMENDA) 10 MG tablet Take 1 tablet (10 mg total) by mouth 2 (two) times daily. 60 tablet 0   memantine (NAMENDA) 5 MG tablet      metoprolol succinate (TOPROL-XL) 25 MG 24 hr tablet      naftifine (NAFTIN) 1 % cream Apply 1 application  topically daily as needed (fungal skin infection). (Patient not taking: Reported on 10/19/2021)     nitroGLYCERIN (NITROSTAT) 0.4 MG SL tablet Place 1 tablet (0.4 mg total) under the tongue every 5 (five) minutes x 3 doses as needed for chest pain. 30 tablet 3   pantoprazole (PROTONIX) 40 MG tablet 1 tablet EVERY day Oral Once a day for 90 days     polyethylene glycol (MIRALAX) 17 g packet 1 packet mixed with 8 ounces of fluid Orally Once a day for 30 day(s)     potassium chloride (KLOR-CON M) 10 MEQ tablet Take 1 tablet by mouth everyday with furosemide 30 tablet 0   predniSONE (DELTASONE)  1 MG tablet Take 3 tablets (3 mg total) by mouth daily with breakfast. 30 tablet 0   Psyllium Husk POWD Take 1 Scoop by mouth daily.      tamsulosin (FLOMAX) 0.4 MG CAPS capsule Take 1 capsule (0.4 mg total) by mouth daily  after supper. 30 capsule 0   torsemide (DEMADEX) 20 MG tablet Take 2 tablets (40 mg total) by mouth daily. (Patient not taking: Reported on 10/19/2021) 60 tablet 0   traMADol (ULTRAM) 50 MG tablet Take 1/2 tablets (25 mg total) by mouth every 6 (six) hours as needed for moderate pain. 30 tablet 0   triamcinolone cream (KENALOG) 0.1 % 1 application External 2-3 times a day for up to 14 days for 14 days (Patient not taking: Reported on 10/19/2021)     vitamin C (ASCORBIC ACID) 500 MG tablet Take 1 tablet (500 mg total) by mouth daily. 30 tablet 0    Pt meds include: Statin :Yes Betablocker: Yes ASA: Yes Other anticoagulants/antiplatelets: Eliquis  Past Medical History:  Diagnosis Date   AAA (abdominal aortic aneurysm) (HCC)    Arthritis    Ascending aorta dilatation (HCC) 03/13/2012   Fusiform dilatation of the ascending thoracic aorta discovered during surgery, max diameter 4.2-4.3 cm   BPH (benign prostatic hypertrophy)    Chronic systolic heart failure ([place]) 03/03/2012   Chronic venous insufficiency    GERD (gastroesophageal reflux disease)    Glaucoma    Gout    History of kidney stones    Hyperlipidemia    Hypertension    Obesity (BMI 30-39.9)    Paroxysmal atrial fibrillation ([place]) 02/29/2012   Recurrent paroxysmal, new-onset    PMR (polymyalgia rheumatica) (HCC)    Polymyalgia rheumatica ([place]) 02/06/2019   Pre-diabetes    S/P CABG x 3 03/06/2012   LIMA to LAD, SVG to OM, SVG to RCA, EVH via right thigh   S/P Maze operation for atrial fibrillation 03/06/2012   Complete bilateral lesions set using bipolar radiofrequency and cryothermy ablation with clipping of LA appendage   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   s/p TAVR with a  26 mm Edwards via the left subclavian by Drs Buena Irish and Bartle   Severe aortic stenosis 09/29/2019   Sleep apnea    USES CPAP NIGHTLY    Past Surgical History:  Procedure Laterality Date   ABDOMINAL AORTOGRAM W/LOWER EXTREMITY N/A 10/26/2020   Procedure: ABDOMINAL AORTOGRAM W/LOWER EXTREMITY;  Surgeon: Broadus John, MD;  Location: [facility];  Service: Cardiovascular;  Laterality: N/A;   CARDIAC CATHETERIZATION     CATARACT EXTRACTION, BILATERAL     with lens implants   CHOLECYSTECTOMY N/A 03/24/2012   Procedure: LAPAROSCOPIC CHOLECYSTECTOMY WITH INTRAOPERATIVE CHOLANGIOGRAM;  Surgeon: Zenovia Jarred, MD;  Location: [facility];  Service: General;  Laterality: N/A;   CORONARY ARTERY BYPASS GRAFT N/A 03/06/2012   Procedure: CORONARY ARTERY BYPASS GRAFTING (CABG);  Surgeon: Rexene Alberts, MD;  Location: [facility];  Service: Open Heart Surgery;  Laterality: N/A;   ENDOVEIN HARVEST OF GREATER SAPHENOUS VEIN Right 03/06/2012   Procedure: ENDOVEIN HARVEST OF GREATER SAPHENOUS VEIN;  Surgeon: Rexene Alberts, MD;  Location: [facility];  Service: Open Heart Surgery;  Laterality: Right;   INTRAOPERATIVE TRANSESOPHAGEAL ECHOCARDIOGRAM N/A 03/06/2012   Procedure: INTRAOPERATIVE TRANSESOPHAGEAL ECHOCARDIOGRAM;  Surgeon: Rexene Alberts, MD;  Location: [facility];  Service: Open Heart Surgery;  Laterality: N/A;   IR KYPHO LUMBAR INC FX REDUCE BONE BX UNI/BIL CANNULATION INC/IMAGING  02/23/2020   LEFT HEART CATH Right 03/02/2012   Procedure: LEFT HEART CATH;  Surgeon: Sherren Mocha, MD;  Location: [facility];  Service: Cardiovascular;  Laterality: Right;   MAZE N/A 03/06/2012   Procedure: MAZE;  Surgeon: Rexene Alberts, MD;  Location: [facility];  Service: Open Heart Surgery;  Laterality: N/A;   PERIPHERAL VASCULAR INTERVENTION Right 10/26/2020   Procedure: PERIPHERAL VASCULAR INTERVENTION;  Surgeon: Broadus John, MD;  Location: [facility];  Service: Cardiovascular;  Laterality: Right;   PROSTATECTOMY      partial   RIGHT/LEFT HEART CATH AND CORONARY/GRAFT ANGIOGRAPHY N/A 09/29/2019   Procedure: RIGHT/LEFT HEART CATH AND CORONARY/GRAFT ANGIOGRAPHY;  Surgeon: Adrian Prows, MD;  Location: [facility] [place] [facility];  Service: Cardiovascular;  Laterality: N/A;   TEE WITHOUT CARDIOVERSION N/A 11/10/2019   Procedure: TRANSESOPHAGEAL ECHOCARDIOGRAM (TEE);  Surgeon: Burnell Blanks, MD;  Location: [facility];  Service: Open Heart Surgery;  Laterality: N/A;   TRANSCATHETER AORTIC VALVE REPLACEMENT, TRANSFEMORAL  11/10/2019    Social History Social History   Tobacco Use   Smoking status: Former    Packs/day: 0.50    Years: 15.00    Total pack years: 7.50    Types: Cigarettes    Quit date: 11/18/1962    Years since quitting: 58.9    Passive exposure: Never   Smokeless tobacco: Never  Vaping Use   Vaping Use: Never used  Substance Use Topics   Alcohol use: Yes    Alcohol/week: 2.0 standard drinks of alcohol    Types: 2 Shots of liquor per week    Comment: daily, not in last few days   Drug use: Never    Family History Family History  Problem Relation Age of Onset   Dementia Mother    Stroke Brother     Allergies  Allergen Reactions   Amoxicillin Other (See Comments)    Headache, debilitated   Bee Venom Anaphylaxis   Avelox [Moxifloxacin] Other (See Comments)    Unknown   Codeine Other (See Comments)   Duloxetine     Other reaction(s): Unknown   Duloxetine Hcl Other (See Comments)    felt weird   Levitra [Vardenafil] Other (See Comments)    Unknown   Morphine Other (See Comments)   Morphine And Related Other (See Comments)    hypotension   Penicillin G Sodium Other (See Comments)    Severe headache, fatigue "debilitated"   Testosterone Other (See Comments)    unknown   Tizanidine Other (See Comments)    unknown Other reaction(s): Not available   Viagra [Sildenafil] Other (See Comments)    didn't like it     REVIEW OF SYSTEMS  General: '[ ]'$  Weight loss, '[ ]'$  Fever, '[ ]'$   chills Neurologic: '[ ]'$  Dizziness, '[ ]'$  Blackouts, '[ ]'$  Seizure '[ ]'$  Stroke, '[ ]'$  "Mini stroke", '[ ]'$  Slurred speech, '[ ]'$  Temporary blindness; '[ ]'$  weakness in arms or legs, '[ ]'$  Hoarseness '[ ]'$  Dysphagia Cardiac: '[ ]'$  Chest pain/pressure, '[ ]'$  Shortness of breath at rest '[ ]'$  Shortness of breath with exertion, '[ ]'$  Atrial fibrillation or irregular heartbeat  Vascular: '[ ]'$  Pain in legs with walking, '[ ]'$  Pain in legs at rest, '[ ]'$  Pain in legs at night,  [x ] Non-healing ulcer, '[ ]'$  Blood clot in vein/DVT,   Pulmonary: '[ ]'$  Home oxygen, '[ ]'$  Productive cough, '[ ]'$  Coughing up blood, '[ ]'$  Asthma,  '[ ]'$  Wheezing '[ ]'$  COPD Musculoskeletal:  '[ ]'$  Arthritis, '[ ]'$  Low back pain, '[ ]'$  Joint pain Hematologic: '[ ]'$  Easy Bruising, '[ ]'$  Anemia; '[ ]'$  Hepatitis Gastrointestinal: '[ ]'$  Blood in stool, '[ ]'$  Gastroesophageal Reflux/heartburn, Urinary: [x ] chronic Kidney disease, '[ ]'$  on HD - '[ ]'$  MWF or '[ ]'$  TTHS, '[ ]'$   Burning with urination, '[ ]'$  Difficulty urinating Skin: '[ ]'$  Rashes, [x ] Wounds Psychological: '[ ]'$  Anxiety, '[ ]'$  Depression  Physical Examination Vitals:   11/02/21 1215 11/02/21 1245 11/02/21 1315 11/02/21 1345  BP: (!) 168/68  (!) 163/67 (!) 160/68  Pulse: (!) 57 (!) 58 (!) 57 (!) 58  Resp: '16 14 12 12  '$ Temp:      TempSrc:      SpO2: 100% 100% 99% 99%  Weight:      Height:       Body mass index is 27.73 kg/m.  General:  WDWN in NAD HENT: WNL Eyes: Pupils equal Pulmonary: normal non-labored breathing , without Rales, rhonchi,  wheezing Cardiac: RRR, without  Murmurs, rubs or gallops; No carotid bruits Abdomen: soft, NT, no masses Skin: no rashes         Vascular Exam/Pulses:Palpable b femoral pulses, left doppler DP/PT, palpable right pedal pulse   Musculoskeletal: no muscle wasting or atrophy; positive B LE edema  Neurologic: A&O X 3; Appropriate Affect ;  SENSATION: normal; MOTOR FUNCTION:grossly intact B LE minimal Speech is fluent/normal   Significant Diagnostic Studies: CBC Lab Results   Component Value Date   WBC 5.1 11/02/2021   HGB 9.2 (L) 11/02/2021   HCT 30.7 (L) 11/02/2021   MCV 94.5 11/02/2021   PLT 149 (L) 11/02/2021    BMET    Component Value Date/Time   NA 140 11/02/2021 1212   NA 136 06/16/2020 1607   K 3.7 11/02/2021 1212   CL 102 11/02/2021 1212   CO2 28 11/02/2021 1212   GLUCOSE 98 11/02/2021 1212   BUN 28 (H) 11/02/2021 1212   BUN 38 (H) 06/16/2020 1607   CREATININE 1.58 (H) 11/02/2021 1212   CREATININE 1.95 (H) 04/28/2021 1123   CREATININE 1.1 04/22/2015 1029   CREATININE 1.40 (H) 03/30/2014 1157   CALCIUM 10.2 11/02/2021 1212   GFRNONAA 41 (L) 11/02/2021 1212   GFRNONAA 32 (L) 04/28/2021 1123   GFRAA 38 (L) 12/18/2019 1124   Estimated Creatinine Clearance: 34 mL/min (A) (by C-G formula based on SCr of 1.58 mg/dL (H)).  COAG Lab Results  Component Value Date   INR 1.1 11/02/2021   INR 1.6 (H) 05/25/2021   INR 1.1 10/26/2020     Non-Invasive Vascular Imaging:  Left foot x ray  Narrative & Impression  CLINICAL DATA:  Second toe wound.   EXAM: LEFT FOOT - COMPLETE 3+ VIEW   COMPARISON:  October 05, 2021.   FINDINGS: There is no evidence of fracture or dislocation. There is no evidence of arthropathy or other focal bone abnormality. Soft tissues are unremarkable.   IMPRESSION: No acute abnormality seen in the left foot. No definite lytic destruction is seen to suggest osteomyelitis.   Right foot x ray  IMPRESSION: Soft tissue abnormality along the tip of the second toe distal phalanx without definite evidence of cortical irregularity. However, given clinical concerns for osseous extension, osteomyelitis is not excluded. Consider further evaluation with MRI if more definitive characterization is clinically warranted.   +-------+-----------+-----------+------------+------------+  ABI/TBIToday's ABIToday's TBIPrevious ABIPrevious TBI  +-------+-----------+-----------+------------+------------+  Right 0.65        0.45       1.09        0.74          +-------+-----------+-----------+------------+------------+  Left  1.22       0.31       1.19        0.76          +-------+-----------+-----------+------------+------------+  ASSESSMENT/PLAN:  Chronic lymphedema Chronic wounds followed by wound care center OP. S/P PTA balloon angioplasty on 10/26/2020 for a right heel wound.  He has palpable pedal pulse on the left LE.  The left second toe wound is negative by X ray for osteomyelitis.  Doppler signals DP/PT on the right LE with healing second toe wound and heel wound.   Roxy Horseman 11/02/2021 2:16 PM   VASCULAR STAFF ADDENDUM: I have independently interviewed and examined the patient. I agree with the above.   89YM with bilateral foot ischemic ulceration. He underwent angiogram with RIGHT lower extremity intervention about a year ago with Dr. Virl Cagey. He reports he is minimally ambulatory. Palpable femoral and popliteal pulses. Weakly palpable L DP. No palpable R pedal pulses. Brisk doppler flow bilaterally. ABI above. Recommend podiatry evaluation. Will discuss with patient in AM RE: risk / benefit / alternative to angiography.  Yevonne Aline. Stanford Breed, MD [facility] Phone Number: [phone] 11/02/2021 4:19 PM

## 2021-11-02 NOTE — ED Triage Notes (Addendum)
Sent by MD due to wound on 2nd toe on left foot that has now gone to the bone.  Not diabetic.  Denies fevers.  Wound has been there approx 2 months  The wound MD yesterday wrote she is concerned for vascular compromise.

## 2021-11-03 ENCOUNTER — Other Ambulatory Visit: Payer: Self-pay

## 2021-11-03 DIAGNOSIS — L97529 Non-pressure chronic ulcer of other part of left foot with unspecified severity: Secondary | ICD-10-CM

## 2021-11-03 DIAGNOSIS — I480 Paroxysmal atrial fibrillation: Secondary | ICD-10-CM | POA: Diagnosis not present

## 2021-11-03 DIAGNOSIS — L97519 Non-pressure chronic ulcer of other part of right foot with unspecified severity: Secondary | ICD-10-CM | POA: Diagnosis not present

## 2021-11-03 DIAGNOSIS — N1831 Chronic kidney disease, stage 3a: Secondary | ICD-10-CM

## 2021-11-03 DIAGNOSIS — I2510 Atherosclerotic heart disease of native coronary artery without angina pectoris: Secondary | ICD-10-CM

## 2021-11-03 DIAGNOSIS — I5032 Chronic diastolic (congestive) heart failure: Secondary | ICD-10-CM

## 2021-11-03 DIAGNOSIS — I10 Essential (primary) hypertension: Secondary | ICD-10-CM | POA: Diagnosis not present

## 2021-11-03 DIAGNOSIS — G4733 Obstructive sleep apnea (adult) (pediatric): Secondary | ICD-10-CM

## 2021-11-03 DIAGNOSIS — M353 Polymyalgia rheumatica: Secondary | ICD-10-CM

## 2021-11-03 DIAGNOSIS — I6930 Unspecified sequelae of cerebral infarction: Secondary | ICD-10-CM

## 2021-11-03 DIAGNOSIS — I739 Peripheral vascular disease, unspecified: Secondary | ICD-10-CM

## 2021-11-03 LAB — CBC
HCT: 26.9 % — ABNORMAL LOW (ref 39.0–52.0)
Hemoglobin: 8.5 g/dL — ABNORMAL LOW (ref 13.0–17.0)
MCH: 29 pg (ref 26.0–34.0)
MCHC: 31.6 g/dL (ref 30.0–36.0)
MCV: 91.8 fL (ref 80.0–100.0)
Platelets: 129 10*3/uL — ABNORMAL LOW (ref 150–400)
RBC: 2.93 MIL/uL — ABNORMAL LOW (ref 4.22–5.81)
RDW: 16.1 % — ABNORMAL HIGH (ref 11.5–15.5)
WBC: 5.5 10*3/uL (ref 4.0–10.5)
nRBC: 0 % (ref 0.0–0.2)

## 2021-11-03 LAB — HEPARIN LEVEL (UNFRACTIONATED): Heparin Unfractionated: >1.1 IU/mL — ABNORMAL HIGH (ref 0.30–0.70)

## 2021-11-03 LAB — GLUCOSE, CAPILLARY: Glucose-Capillary: 117 mg/dL — ABNORMAL HIGH (ref 70–99)

## 2021-11-03 LAB — APTT
aPTT: 90 seconds — ABNORMAL HIGH (ref 24–36)
aPTT: 104 seconds — ABNORMAL HIGH (ref 24–36)

## 2021-11-03 MED ORDER — VITAMIN C 500 MG PO TABS
500.0000 mg | ORAL_TABLET | Freq: Every day | ORAL | Status: DC
  Administered 2021-11-03 – 2021-11-07 (×4): 500 mg via ORAL
  Filled 2021-11-03 (×5): qty 1

## 2021-11-03 MED ORDER — PSYLLIUM 95 % PO PACK
1.0000 | PACK | Freq: Every day | ORAL | Status: DC
  Administered 2021-11-03 – 2021-11-14 (×10): 1 via ORAL
  Filled 2021-11-03 (×12): qty 1

## 2021-11-03 MED ORDER — POLYETHYLENE GLYCOL 3350 17 G PO PACK
17.0000 g | PACK | Freq: Every day | ORAL | Status: DC
  Administered 2021-11-03 – 2021-11-14 (×5): 17 g via ORAL
  Filled 2021-11-03 (×7): qty 1

## 2021-11-03 MED ORDER — PANTOPRAZOLE SODIUM 40 MG PO TBEC
40.0000 mg | DELAYED_RELEASE_TABLET | Freq: Every day | ORAL | Status: DC
  Administered 2021-11-03 – 2021-11-14 (×11): 40 mg via ORAL
  Filled 2021-11-03 (×11): qty 1

## 2021-11-03 MED ORDER — PSYLLIUM 95 % PO PACK
1.0000 | PACK | Freq: Every day | ORAL | Status: DC
  Filled 2021-11-03: qty 1

## 2021-11-03 MED ORDER — AMIODARONE HCL 200 MG PO TABS
200.0000 mg | ORAL_TABLET | Freq: Every day | ORAL | Status: DC
  Administered 2021-11-03 – 2021-11-14 (×11): 200 mg via ORAL
  Filled 2021-11-03 (×11): qty 1

## 2021-11-03 MED ORDER — POTASSIUM CHLORIDE CRYS ER 10 MEQ PO TBCR
10.0000 meq | EXTENDED_RELEASE_TABLET | Freq: Every day | ORAL | Status: DC
  Administered 2021-11-03 – 2021-11-14 (×11): 10 meq via ORAL
  Filled 2021-11-03 (×11): qty 1

## 2021-11-03 MED ORDER — PREDNISONE 1 MG PO TABS
3.0000 mg | ORAL_TABLET | Freq: Every day | ORAL | Status: DC
  Administered 2021-11-04 – 2021-11-14 (×9): 3 mg via ORAL
  Filled 2021-11-03 (×14): qty 3

## 2021-11-03 MED ORDER — GABAPENTIN 100 MG PO CAPS
200.0000 mg | ORAL_CAPSULE | Freq: Two times a day (BID) | ORAL | Status: DC
  Administered 2021-11-03 – 2021-11-14 (×21): 200 mg via ORAL
  Filled 2021-11-03 (×21): qty 2

## 2021-11-03 MED ORDER — CYCLOBENZAPRINE HCL 10 MG PO TABS: 5.0000 mg | ORAL_TABLET | Freq: Every evening | ORAL | Status: DC | PRN

## 2021-11-03 MED ORDER — EZETIMIBE 10 MG PO TABS
10.0000 mg | ORAL_TABLET | Freq: Every day | ORAL | Status: DC
  Administered 2021-11-03 – 2021-11-14 (×11): 10 mg via ORAL
  Filled 2021-11-03 (×11): qty 1

## 2021-11-03 MED ORDER — ATORVASTATIN CALCIUM 40 MG PO TABS: 40.0000 mg | ORAL_TABLET | Freq: Every day | ORAL | 1 refills | Status: DC

## 2021-11-03 MED ORDER — VITAMIN D 25 MCG (1000 UNIT) PO TABS
1000.0000 [IU] | ORAL_TABLET | Freq: Every day | ORAL | Status: DC
  Administered 2021-11-03 – 2021-11-14 (×10): 1000 [IU] via ORAL
  Filled 2021-11-03 (×11): qty 1

## 2021-11-03 MED ORDER — FINASTERIDE 5 MG PO TABS
5.0000 mg | ORAL_TABLET | Freq: Every day | ORAL | Status: DC
  Administered 2021-11-03 – 2021-11-14 (×11): 5 mg via ORAL
  Filled 2021-11-03 (×11): qty 1

## 2021-11-03 MED ORDER — ACETAMINOPHEN 325 MG PO TABS
650.0000 mg | ORAL_TABLET | ORAL | Status: DC | PRN
  Administered 2021-11-03 – 2021-11-06 (×5): 650 mg via ORAL
  Filled 2021-11-03 (×6): qty 2

## 2021-11-03 NOTE — Consult Note (Signed)
WOC Nurse Consult Note: Patient receiving care in [place]. Vascular is planning an BLE angiography for limb salvage on Monday 11/06/21. Reason for Consult: "generalized wounds" Wound type: ischemic Pressure Injury POA: Yes/No/NA Measurement: Wound bed:see photos Drainage (amount, consistency, odor)  Periwound: Dressing procedure/placement/frequency: Apply iodine from the swabsticks or swab pads from clean utility to bilateral foot/toe wounds.  Allow to air dry. Do NOT apply foam dressings to the wounds.  If a dressing is preferred, use dry gauze.    NOTE:  IF A MEDICAL PROVIDER GIVES ORDERS FOR THE WOUNDS THAT DIFFER FROM THOSE ABOVE, FOLLOW THE MD ORDERS AND DISCONTINUE THOSE ABOVE.  [place] nurse will not follow at this time.  Please re-consult the [facility] team if needed.  Val Riles, RN, MSN, CWOCN, CNS-BC, pager [phone]

## 2021-11-03 NOTE — Progress Notes (Signed)
PROGRESS NOTE    [name] [name]  QAS:[id number] DOB: 05-23-1926 DOA: 11/02/2021 PCP: [name], MD    Brief Narrative:   [name] [name] is a 88 years old male with past medical history significant of hypertension, hyperlipidemia, CAD s/p CABG, PAF s/p maze on chronic anticoagulation, systolic CHF, severe AS s/p TAVR, CVA with residual deficit, PVD, PMR, AAA, OSA on CPAP presented to hospital with worsening of feet wounds.  Patient was seen at the wound care clinic and due to worsening wound of his left second toe he was sent to the hospital for further evaluation.  Patient did have history of stroke in May 2023 and has been having difficult time  with ADLs.  In the ED patient was bradycardic and mildly hypotensive.  WBC was 5.1.  Creatinine 1.5.  X-ray of the left foot did not show any osseous abnormality.  Chest x-ray was negative for acute findings. Dr. Stann Mainland of orthopedics was consulted and notified Dr. Sharol Given of the patient being admitted to the hospital.  ABIs with TBI been ordered.  Blood cultures have been obtained.  Assessment and Plan:   Skin ulcers of feet, bilateral, right heel pressure ulceration. Acute on chronic.  Presenting with worsening ulceration on the left.,  Significant pressure ulcer on the right heel.  Was followed by wound care as outpatient.  X-ray without signs of osteomyelitis follow-up ABI.    ESR elevated at 103, CRP 5.0.  Continue to hold off with antibiotic at this time.  Follow orthopedics/vascular surgery recommendations   PAF on chronic anticoagulation Continue heparin drip at this time.  On Eliquis as outpatient  Essential hypertension Metoprolol listed but was not taking it.  We will continue to monitor closely.   Diastolic congestive heart failure Review of 2D echocardiogram from 05/2021 with left ventricle ejection fraction of  65 -70%.  Appears to be compensated at this time.  Supposed to be on metoprolol but not taking it.  Unsure.  On torsemide as  outpatient.  We will hold for now.  CAD S/p CABG -Continue Imdur and statin   PAD Status post angioplasty of the right posterior tibial artery 10/2020.  Continue Lipitor.  Vascular surgery has been consulted, plan for ABI.   History of CVA with residual deficit Patient suffered left middle cerebral artery stroke in 05/2021 with residual right hand weakness and difficulty speaking.  Continue supportive care.  Continue statins.   CKD stage IIIb Creatinine 1.58 which appears near patient's baseline.  We will continue to monitor BMP while in the hospital.   History of rheumatoid arthritis/PMR Needing assistance with ADLs.  We will get PT evaluation.  Wheelchair-bound currently.  On leflunomide and prednisone.  We will hold for now.  Dementia On Namenda and galantamine we will continue.   OSA -Continue CPAP at night      DVT prophylaxis: Heparin drip   Code Status:     Code Status: DNR  Disposition: Home Status is: Inpatient  Remains inpatient appropriate because: Foot ulcers, vascular surgery evaluation,   Family Communication: None at bedside  Consultants:  Vascular surgery,  orthopedics  Procedures:  None  Antimicrobials:  None yet  Anti-infectives (From admission, onward)    None       Subjective: Today, patient was seen and examined at bedside.  Patient complains of constipation.  Takes MiraLAX every day at home.  Denies any nausea vomiting or abdominal pain. Denies much pain in legs.  Objective: Vitals:   11/02/21 1642 11/02/21  2021 11/03/21 0620 11/03/21 0755  BP: (!) 153/58 (!) 143/65 (!) 135/57 106/86  Pulse: 60 75 74 70  Resp: 17   16  Temp: (!) 97.3 F (36.3 C) 97.6 F (36.4 C) 98.6 F (37 C) 98.3 F (36.8 C)  TempSrc: Oral Oral  Oral  SpO2: 97% 100% 100% 96%  Weight:      Height:        Intake/Output Summary (Last 24 hours) at 11/03/2021 1027 Last data filed at 11/03/2021 0200 Gross per 24 hour  Intake --  Output 700 ml  Net -700 ml    Filed Weights   11/02/21 1045  Weight: 98 kg    Physical Examination: Body mass index is 27.73 kg/m.  General:  Average built, not in obvious distress HENT:   No scleral pallor or icterus noted. Oral mucosa is moist.  Chest:  Clear breath sounds.  Diminished breath sounds bilaterally. No crackles or wheezes.  CVS: S1 &S2 heard. No murmur.  Regular rate and rhythm. Abdomen: Soft, nontender, nondistended.  Bowel sounds are heard.   Extremities: No cyanosis, clubbing,Bilateral lower extremity wounds with dressing in place.  Unable to palpate dorsalis pedis  Psych: Alert, awake and oriented, normal mood CNS:  No cranial nerve deficits.   Skin: Warm and dry.  No rashes noted.  Bilateral lower extremity wounds with dressing in place.    Data Reviewed:   CBC: Recent Labs  Lab 11/02/21 1212 11/03/21 0545  WBC 5.1 5.5  NEUTROABS 3.3  --   HGB 9.2* 8.5*  HCT 30.7* 26.9*  MCV 94.5 91.8  PLT 149* 129*    Basic Metabolic Panel: Recent Labs  Lab 11/02/21 1212  NA 140  K 3.7  CL 102  CO2 28  GLUCOSE 98  BUN 28*  CREATININE 1.58*  CALCIUM 10.2    Liver Function Tests: Recent Labs  Lab 11/02/21 1212  AST 21  ALT 17  ALKPHOS 114  BILITOT 0.4  PROT 7.0  ALBUMIN 3.1*     Radiology Studies: VAS [place] ABI WITH/WO TBI  Result Date: 11/02/2021  LOWER EXTREMITY DOPPLER STUDY Patient Name:  [name] [name]  Date of Exam:   11/02/2021 Medical Rec #: [id number]      Accession #:    [id number] Date of Birth: [birth year]       Patient Gender: M Patient Age:   56 years Exam Location:  [facility] Procedure:      VAS [place] ABI WITH/WO TBI Referring Phys: Merleen Nicely FORD --------------------------------------------------------------------------------  Indications: Ulceration. High Risk Factors: Hypertension, hyperlipidemia, past history of smoking,                    coronary artery disease, prior CVA. Other Factors: S/P CABG x 3, S/P TAVR, CKD, AAA, Afib.  Vascular Interventions: Angiogram  and posterior tibial balloon angioplasty of                         RLE (10/26/20). Comparison Study: Previous exam 03/24/21 WNL Performing Technologist: Rogelia Rohrer RVT, RDMS  Examination Guidelines: A complete evaluation includes at minimum, Doppler waveform signals and systolic blood pressure reading at the level of bilateral brachial, anterior tibial, and posterior tibial arteries, when vessel segments are accessible. Bilateral testing is considered an integral part of a complete examination. Photoelectric Plethysmograph (PPG) waveforms and toe systolic pressure readings are included as required and additional duplex testing as needed. Limited examinations for reoccurring indications may be performed  as noted.  ABI Findings: +---------+------------------+-----+----------+--------+ Right    Rt Pressure (mmHg)IndexWaveform  Comment  +---------+------------------+-----+----------+--------+ Brachial 167                    triphasic          +---------+------------------+-----+----------+--------+ PTA      103               0.62 monophasic         +---------+------------------+-----+----------+--------+ DP       109               0.65 monophasic         +---------+------------------+-----+----------+--------+ Great Toe75                0.45 Abnormal           +---------+------------------+-----+----------+--------+ +---------+------------------+-----+----------+-------+ Left     Lt Pressure (mmHg)IndexWaveform  Comment +---------+------------------+-----+----------+-------+ Brachial 167                    triphasic         +---------+------------------+-----+----------+-------+ PTA      203               1.22 monophasic        +---------+------------------+-----+----------+-------+ DP       158               0.95 monophasic        +---------+------------------+-----+----------+-------+ Great Toe52                0.31 Abnormal           +---------+------------------+-----+----------+-------+ +-------+-----------+-----------+------------+------------+ ABI/TBIToday's ABIToday's TBIPrevious ABIPrevious TBI +-------+-----------+-----------+------------+------------+ Right  0.65       0.45       1.09        0.74         +-------+-----------+-----------+------------+------------+ Left   1.22       0.31       1.19        0.76         +-------+-----------+-----------+------------+------------+ Bilateral TBIs appear decreased. Right ABIs appear decreased.  Summary: Right: Resting right ankle-brachial index indicates moderate right lower extremity arterial disease. The right toe-brachial index is abnormal. Left: The left toe-brachial index is abnormal. Although ankle brachial indices are within normal limits (0.95-1.29), arterial Doppler waveforms at the ankle suggest some component of arterial occlusive disease. *See table(s) above for measurements and observations.  Electronically signed by Jamelle Haring on 11/02/2021 at 4:05:01 PM.    Final    DG Foot Complete Right  Result Date: 11/02/2021 CLINICAL DATA:  Infection.  Second toe. EXAM: RIGHT FOOT COMPLETE - 3+ VIEW COMPARISON:  None Available. FINDINGS: There is a soft tissue abnormality along the tip of the second toe distal phalanx. There is no definite evidence of cortical irregularity on the provided views. No evidence of subcutaneous gas. There are enthesopathic changes at the calcaneus. There are multifocal degenerative changes at the forefoot and hindfoot. Vascular calcifications are present. IMPRESSION: Soft tissue abnormality along the tip of the second toe distal phalanx without definite evidence of cortical irregularity. However, given clinical concerns for osseous extension, osteomyelitis is not excluded. Consider further evaluation with MRI if more definitive characterization is clinically warranted. Electronically Signed   By: Marin Roberts M.D.   On: 11/02/2021 12:41    DG Foot Complete Left  Result Date: 11/02/2021 CLINICAL DATA:  Second toe wound. EXAM: LEFT FOOT - COMPLETE 3+ VIEW COMPARISON:  October 05, 2021. FINDINGS: There is no evidence of fracture or dislocation. There is no evidence of arthropathy or other focal bone abnormality. Soft tissues are unremarkable. IMPRESSION: No acute abnormality seen in the left foot. No definite lytic destruction is seen to suggest osteomyelitis. Electronically Signed   By: Marijo Conception M.D.   On: 11/02/2021 11:54   DG Chest 2 View  Result Date: 11/02/2021 CLINICAL DATA:  Possible sepsis with second toe wound, initial encounter EXAM: CHEST - 2 VIEW COMPARISON:  10/22/2020 FINDINGS: Cardiac shadow is stable. Postsurgical changes are noted as well as prior TAVR. The lungs are well aerated bilaterally. No focal infiltrate or sizable effusion is noted. Changes of prior vertebral augmentation at L1 are noted. IMPRESSION: No active cardiopulmonary disease. Electronically Signed   By: Inez Catalina M.D.   On: 11/02/2021 11:16      LOS: 1 day    Flora Lipps, MD [facility] Available via Epic secure chat 7am-7pm After these hours, please refer to coverage provider listed on [email or web address] 11/03/2021, 10:27 AM

## 2021-11-03 NOTE — Progress Notes (Addendum)
  Progress Note    11/03/2021 7:43 AM * No surgery found *  Subjective:  Soreness R heel; no discomfort L toe wound   Vitals:   11/02/21 2021 11/03/21 0620  BP: (!) 143/65 (!) 135/57  Pulse: 75 74  Resp:    Temp: 97.6 F (36.4 C) 98.6 F (37 C)  SpO2: 100% 100%   Physical Exam: Lungs:  non labored Extremities:  BLE wounds with dressing left in place; unable to palpate pedal pulses bilaterally Neurologic: A&O  CBC    Component Value Date/Time   WBC 5.5 11/03/2021 0545   RBC 2.93 (L) 11/03/2021 0545   HGB 8.5 (L) 11/03/2021 0545   HGB 9.5 (L) 10/24/2021 1228   HGB 10.8 (L) 09/23/2019 1341   HCT 26.9 (L) 11/03/2021 0545   HCT 32.9 (L) 09/23/2019 1341   PLT 129 (L) 11/03/2021 0545   PLT 147 (L) 10/24/2021 1228   PLT 199 09/23/2019 1341   MCV 91.8 11/03/2021 0545   MCV 95 09/23/2019 1341   MCH 29.0 11/03/2021 0545   MCHC 31.6 11/03/2021 0545   RDW 16.1 (H) 11/03/2021 0545   RDW 13.2 09/23/2019 1341   LYMPHSABS 1.0 11/02/2021 1212   MONOABS 0.6 11/02/2021 1212   EOSABS 0.1 11/02/2021 1212   BASOSABS 0.0 11/02/2021 1212    BMET    Component Value Date/Time   NA 140 11/02/2021 1212   NA 136 06/16/2020 1607   K 3.7 11/02/2021 1212   CL 102 11/02/2021 1212   CO2 28 11/02/2021 1212   GLUCOSE 98 11/02/2021 1212   BUN 28 (H) 11/02/2021 1212   BUN 38 (H) 06/16/2020 1607   CREATININE 1.58 (H) 11/02/2021 1212   CREATININE 1.95 (H) 04/28/2021 1123   CREATININE 1.1 04/22/2015 1029   CREATININE 1.40 (H) 03/30/2014 1157   CALCIUM 10.2 11/02/2021 1212   GFRNONAA 41 (L) 11/02/2021 1212   GFRNONAA 32 (L) 04/28/2021 1123   GFRAA 38 (L) 12/18/2019 1124    INR    Component Value Date/Time   INR 1.1 11/02/2021 1212     Intake/Output Summary (Last 24 hours) at 11/03/2021 0743 Last data filed at 11/03/2021 0200 Gross per 24 hour  Intake --  Output 700 ml  Net -700 ml     Assessment/Plan:  88 y.o. male with BLE wounds  Mr. Mackie is a 88y.o male who is  wheelchair bound who presents to the hospital with BLE wounds.  He underwent retrograde tibial intervention of the R leg a year ago.  He has an extensive R heel pressure sore.  Orthopedics will see later today.  Avoid pressure to R heel while in bed.  Dr. Stanford Breed will re-evaluate the patient this morning and discuss his options.   Dagoberto Ligas, PA-C [facility] [phone] 11/03/2021 7:43 AM  VASCULAR STAFF ADDENDUM: I have independently interviewed and examined the patient. I agree with the above.  Patient would like to be aggressive and pursue limb salvage. I think an angiography-only approach would be best. I do not think he would tolerate open vascular surgery well. Plan for BLE angiography with focus on left for limb salvage on Monday 11/06/21. Dr. Virl Cagey will check in Sunday to make sure he is ready for cath lab.  Yevonne Aline. Stanford Breed, MD [facility] Phone Number: [phone] 11/03/2021 9:22 AM

## 2021-11-03 NOTE — Progress Notes (Signed)
ANTICOAGULATION CONSULT NOTE  Pharmacy Consult for Heparin Indication: atrial fibrillation  Allergies  Allergen Reactions   Amoxicillin Other (See Comments)    Headache, debilitated   Bee Venom Anaphylaxis   Avelox [Moxifloxacin] Other (See Comments)    Unknown   Codeine Other (See Comments)    Unknown    Duloxetine Other (See Comments)    Unknown   Duloxetine Hcl Other (See Comments)    felt weird   Levitra [Vardenafil] Other (See Comments)    Unknown   Morphine And Related Other (See Comments)    hypotension   Penicillin G Sodium Other (See Comments)    Severe headache, fatigue "debilitated"   Testosterone Other (See Comments)    Unknown    Tizanidine Other (See Comments)    Unknown     Viagra [Sildenafil] Other (See Comments)    didn't like it    Patient Measurements: Height: '6\' 2"'$  (188 cm) Weight: 98 kg (216 lb) IBW/kg (Calculated) : 82.2 Heparin Dosing Weight: 98 kg  Vital Signs: Temp: 97.4 F (36.3 C) (11/03 1547) Temp Source: Oral (11/03 1547) BP: 113/47 (11/03 1547) Pulse Rate: 59 (11/03 1547)  Labs: Recent Labs    11/02/21 1212 11/02/21 1606 11/03/21 0545 11/03/21 1436  HGB 9.2*  --  8.5*  --   HCT 30.7*  --  26.9*  --   PLT 149*  --  129*  --   APTT  --  32 90* 104*  LABPROT 13.9  --   --   --   INR 1.1  --   --   --   HEPARINUNFRC  --  >1.10* >1.10*  --   CREATININE 1.58*  --   --   --      Estimated Creatinine Clearance: 34 mL/min (A) (by C-G formula based on SCr of 1.58 mg/dL (H)).   Medical History: Past Medical History:  Diagnosis Date   AAA (abdominal aortic aneurysm) (HCC)    Arthritis    Ascending aorta dilatation ([place]) 03/13/2012   Fusiform dilatation of the ascending thoracic aorta discovered during surgery, max diameter 4.2-4.3 cm   BPH (benign prostatic hypertrophy)    Chronic systolic heart failure ([place]) 03/03/2012   Chronic venous insufficiency    GERD (gastroesophageal reflux disease)    Glaucoma    Gout     History of kidney stones    Hyperlipidemia    Hypertension    Obesity (BMI 30-39.9)    Paroxysmal atrial fibrillation ([place]) 02/29/2012   Recurrent paroxysmal, new-onset    PMR (polymyalgia rheumatica) (HCC)    Polymyalgia rheumatica ([place]) 02/06/2019   Pre-diabetes    S/P CABG x 3 03/06/2012   LIMA to LAD, SVG to OM, SVG to RCA, EVH via right thigh   S/P Maze operation for atrial fibrillation 03/06/2012   Complete bilateral lesions set using bipolar radiofrequency and cryothermy ablation with clipping of LA appendage   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   s/p TAVR with a 26 mm Edwards via the left subclavian by Drs Buena Irish and Bartle   Severe aortic stenosis 09/29/2019   Sleep apnea    USES CPAP NIGHTLY    Medications:  Medications Prior to Admission  Medication Sig Dispense Refill Last Dose   acetaminophen (TYLENOL) 325 MG tablet Take 2 tablets (650 mg total) by mouth every 4 (four) hours as needed for mild pain (or temp > 37.5 C (99.5 F)). (Patient taking differently: Take 650 mg by mouth in the morning and  at bedtime.)   11/02/2021   amiodarone (PACERONE) 200 MG tablet Take 1 tablet (200 mg total) by mouth daily. 30 tablet 0 11/02/2021   apixaban (ELIQUIS) 2.5 MG TABS tablet Take 2.5 mg by mouth 2 (two) times daily.   11/02/2021 at 0900   cholecalciferol (VITAMIN D3) 25 MCG (1000 UNIT) tablet Take 1 tablet (1,000 Units total) by mouth daily. 30 tablet 0 11/02/2021   Cyanocobalamin (VITAMIN B-12) 5000 MCG TBDP Take 5,000 mcg by mouth daily. 30 tablet 0 11/02/2021   cyclobenzaprine (FLEXERIL) 5 MG tablet Take 1 tablet (5 mg total) by mouth 3 (three) times daily as needed for muscle spasms. (Patient taking differently: Take 5 mg by mouth at bedtime as needed for muscle spasms.) 30 tablet 0 Past Week   denosumab (PROLIA) 60 MG/ML SOSY injection Inject 60 mg into the skin every 6 (six) months.   3-4 months   docusate sodium (COLACE) 100 MG capsule Take 1 capsule (100  mg total) by mouth 2 (two) times daily.   11/02/2021   EPINEPHrine 0.3 mg/0.3 mL IJ SOAJ injection Inject 0.3 mg into the muscle as needed for anaphylaxis.   unk   ezetimibe (ZETIA) 10 MG tablet Take 1 tablet (10 mg total) by mouth daily. 30 tablet 0 11/02/2021   finasteride (PROSCAR) 5 MG tablet Take 1 tablet (5 mg total) by mouth daily. 30 tablet 0 11/02/2021   fish oil-omega-3 fatty acids 1000 MG capsule Take 1 capsule (1 g total) by mouth daily. 30 capsule 0 11/02/2021   gabapentin (NEURONTIN) 100 MG capsule Take 2 capsules by mouth in the morning, 1 capsule in the evening, and 2 capsules at bedtime. (Patient taking differently: Take 100-200 mg by mouth See admin instructions. Take 200 mg by mouth in the morning and at bedtime. Take 100 mg by mouth in the evening.) 90 capsule 0 11/02/2021   galantamine (RAZADYNE ER) 8 MG 24 hr capsule Take 1 capsule (8 mg total) by mouth daily with breakfast. 30 capsule 0 11/02/2021   isosorbide mononitrate (IMDUR) 60 MG 24 hr tablet Take 1 tablet (60 mg total) by mouth daily. 30 tablet 0 11/02/2021   leflunomide (ARAVA) 20 MG tablet Take 1/2 tablets (10 mg total) by mouth daily. 30 tablet 0 11/02/2021   Magnesium 250 MG TABS Take 1 tablet (250 mg total) by mouth daily. 30 tablet 0 11/02/2021   memantine (NAMENDA) 10 MG tablet Take 1 tablet (10 mg total) by mouth 2 (two) times daily. 60 tablet 0 11/02/2021   nitroGLYCERIN (NITROSTAT) 0.4 MG SL tablet Place 1 tablet (0.4 mg total) under the tongue every 5 (five) minutes x 3 doses as needed for chest pain. 30 tablet 3 unk   pantoprazole (PROTONIX) 40 MG tablet Take 40 mg by mouth daily.   11/02/2021   polyethylene glycol (MIRALAX) 17 g packet Take 17 g by mouth daily.   11/01/2021   potassium chloride (KLOR-CON M) 10 MEQ tablet Take 1 tablet by mouth everyday with furosemide (Patient taking differently: Take 10 mEq by mouth daily.) 30 tablet 0 11/02/2021   predniSONE (DELTASONE) 1 MG tablet Take 3 tablets (3 mg total) by mouth  daily with breakfast. 30 tablet 0 11/02/2021   Psyllium Husk POWD Take 1 Scoop by mouth daily.    11/02/2021   tamsulosin (FLOMAX) 0.4 MG CAPS capsule Take 1 capsule (0.4 mg total) by mouth daily after supper. 30 capsule 0 11/01/2021   torsemide (DEMADEX) 20 MG tablet Take 2 tablets (40 mg  total) by mouth daily. 60 tablet 0 11/02/2021   traMADol (ULTRAM) 50 MG tablet Take 1/2 tablets (25 mg total) by mouth every 6 (six) hours as needed for moderate pain. (Patient taking differently: Take 50 mg by mouth daily as needed for moderate pain.) 30 tablet 0 11/01/2021   triamcinolone cream (KENALOG) 0.1 % Apply 1 Application topically as needed (rash/itching).   Past Month   vitamin C (ASCORBIC ACID) 500 MG tablet Take 1 tablet (500 mg total) by mouth daily. 30 tablet 0 11/02/2021   amLODipine (NORVASC) 10 MG tablet 1 tablet Orally Once a day (Patient not taking: Reported on 10/19/2021)   Not Taking   collagenase (SANTYL) 250 UNIT/GM ointment  (Patient not taking: Reported on 11/03/2021)   Not Taking   epoetin alfa-epbx (RETACRIT) 2000 UNIT/ML injection ? dose as directed per hematology Injection every 3 weeks. (Patient not taking: Reported on 11/03/2021)   Not Taking   metoprolol succinate (TOPROL-XL) 25 MG 24 hr tablet  (Patient not taking: Reported on 11/03/2021)   Not Taking    Scheduled:   amiodarone  200 mg Oral Daily   ascorbic acid  500 mg Oral Daily   atorvastatin  40 mg Oral Daily   cholecalciferol  1,000 Units Oral Daily   ezetimibe  10 mg Oral Daily   finasteride  5 mg Oral Daily   gabapentin  200 mg Oral BID   galantamine  8 mg Oral Q breakfast   isosorbide mononitrate  60 mg Oral Daily   memantine  10 mg Oral BID   pantoprazole  40 mg Oral Daily   polyethylene glycol  17 g Oral Daily   potassium chloride  10 mEq Oral Daily   [START ON 11/04/2021] predniSONE  3 mg Oral Q breakfast   psyllium  1 packet Oral Daily   sodium chloride flush  3 mL Intravenous Q12H   tamsulosin  0.4 mg Oral QPC  supper   Infusions:   heparin 1,400 Units/hr (11/02/21 2204)   PRN: acetaminophen, albuterol, cyclobenzaprine, hydrALAZINE, traMADol  Assessment: 52 yom with a history of HTN, HLD, CAD s/p CABG, AF s/p maze on apixaban, HF, s/p TAVR, CVA, PVD, AAA, OSA on CPAP. Patient is presenting with wound infection. Last dose of Eliquis 11/2 AM. Pharmacy consulted to dose heparin while determining surgical intervention.  Given recent Eliquis use, will require aPTT monitoring until levels are correlating.  APTT came back slightly supratherapeutic. We will reduce rate and check level in AM   Goal of Therapy:  Heparin level 0.3-0.7 units/ml aPTT 66-102 seconds Monitor platelets by anticoagulation protocol: Yes   Plan:  Decrease heparin 1300 units/hr Check PTT/HL in AM Daily aPTT/heparin level until correlating Monitor for signs/symptoms of bleeding.  Onnie Boer, PharmD, BCIDP, AAHIVP, CPP Infectious Disease Pharmacist 11/03/2021 4:17 PM

## 2021-11-03 NOTE — Hospital Course (Addendum)
[name] [name] is a 88 years old male with past medical history significant of hypertension, hyperlipidemia, CAD s/p CABG, PAF s/p maze on chronic anticoagulation, systolic CHF, severe AS s/p TAVR, CVA with residual deficit, PVD, PMR, AAA, OSA on CPAP presented to hospital with worsening of feet wounds.  Patient was seen at the wound care clinic and due to worsening wound of his left second toe he was sent to the hospital for further evaluation.  Patient did have history of stroke in May 2023 and has been having difficult time  with ADLs.  In the ED patient was bradycardic and mildly hypotensive.  WBC was 5.1.  Creatinine 1.5.  X-ray of the left foot did not show any osseous abnormality.  Chest x-ray was negative for acute findings. Dr. Stann Mainland of orthopedics was consulted and notified Dr. Sharol Given of the patient being admitted to the hospital.  ABIs with TBI been ordered.  Blood cultures have been obtained.  Assessment and Plan:   Skin ulcers of feet, bilateral, right heel pressure ulceration. Acute on chronic.  Presenting with worsening ulceration on the left.,  Significant pressure ulcer on the right heel.  Was followed by wound care as outpatient.  X-ray without signs of vestibulitis.  Follow-up ABI.  Orthopedics on board.   ESR elevated at 103, CRP 5.0.  Continue to hold off with antibiotic at this time.  Follow orthopedics/vascular surgery recommendations   PAF on chronic anticoagulation Continue heparin drip at this time.  Essential hypertension Medication reconciliation pending.  Looks like patient is on metoprolol at home.   Diastolic congestive heart failure Review of 2D echocardiogram from 05/2021 with left ventricle ejection fraction of  65 -70%.  Appears to be compensated at this time.  Will need to restart cardiac medications when med rec is done.  CAD S/p CABG -Continue Imdur and statin   PAD Status post angioplasty of the right posterior tibial artery 10/2020.  Continue Lipitor.    History of CVA with residual deficit Patient suffered left middle cerebral artery stroke in 05/2021 with residual right hand weakness and difficulty speaking.  Continue supportive care.  Continue statins.   CKD stage IIIb Creatinine 1.58 which appears near patient's baseline.  We will continue to monitor BMP while in the hospital.   History of rheumatoid arthritis/PMR Needing assistance with ADLs.  We will get PT evaluation.  Wheelchair-bound currently.  Dementia On Namenda and galantamine we will continue.   OSA -Continue CPAP at night

## 2021-11-03 NOTE — Progress Notes (Signed)
ANTICOAGULATION CONSULT NOTE  Pharmacy Consult for Heparin Indication: atrial fibrillation  Allergies  Allergen Reactions   Amoxicillin Other (See Comments)    Headache, debilitated   Bee Venom Anaphylaxis   Avelox [Moxifloxacin] Other (See Comments)    Unknown   Codeine Other (See Comments)   Duloxetine     Other reaction(s): Unknown   Duloxetine Hcl Other (See Comments)    felt weird   Levitra [Vardenafil] Other (See Comments)    Unknown   Morphine Other (See Comments)   Morphine And Related Other (See Comments)    hypotension   Penicillin G Sodium Other (See Comments)    Severe headache, fatigue "debilitated"   Testosterone Other (See Comments)    unknown   Tizanidine Other (See Comments)    unknown Other reaction(s): Not available   Viagra [Sildenafil] Other (See Comments)    didn't like it    Patient Measurements: Height: '6\' 2"'$  (188 cm) Weight: 98 kg (216 lb) IBW/kg (Calculated) : 82.2 Heparin Dosing Weight: 98 kg  Vital Signs: Temp: 98.6 F (37 C) (11/03 0620) Temp Source: Oral (11/02 2021) BP: 135/57 (11/03 0620) Pulse Rate: 74 (11/03 0620)  Labs: Recent Labs    11/02/21 1212 11/02/21 1606 11/03/21 0545  HGB 9.2*  --  8.5*  HCT 30.7*  --  26.9*  PLT 149*  --  129*  APTT  --  32 90*  LABPROT 13.9  --   --   INR 1.1  --   --   HEPARINUNFRC  --  >1.10* >1.10*  CREATININE 1.58*  --   --      Estimated Creatinine Clearance: 34 mL/min (A) (by C-G formula based on SCr of 1.58 mg/dL (H)).   Medical History: Past Medical History:  Diagnosis Date   AAA (abdominal aortic aneurysm) (HCC)    Arthritis    Ascending aorta dilatation ([place]) 03/13/2012   Fusiform dilatation of the ascending thoracic aorta discovered during surgery, max diameter 4.2-4.3 cm   BPH (benign prostatic hypertrophy)    Chronic systolic heart failure ([place]) 03/03/2012   Chronic venous insufficiency    GERD (gastroesophageal reflux disease)    Glaucoma    Gout    History of  kidney stones    Hyperlipidemia    Hypertension    Obesity (BMI 30-39.9)    Paroxysmal atrial fibrillation ([place]) 02/29/2012   Recurrent paroxysmal, new-onset    PMR (polymyalgia rheumatica) (HCC)    Polymyalgia rheumatica ([place]) 02/06/2019   Pre-diabetes    S/P CABG x 3 03/06/2012   LIMA to LAD, SVG to OM, SVG to RCA, EVH via right thigh   S/P Maze operation for atrial fibrillation 03/06/2012   Complete bilateral lesions set using bipolar radiofrequency and cryothermy ablation with clipping of LA appendage   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   s/p TAVR with a 26 mm Edwards via the left subclavian by Drs Buena Irish and Bartle   Severe aortic stenosis 09/29/2019   Sleep apnea    USES CPAP NIGHTLY    Medications:  Medications Prior to Admission  Medication Sig Dispense Refill Last Dose   acetaminophen (TYLENOL) 325 MG tablet Take 2 tablets (650 mg total) by mouth every 4 (four) hours as needed for mild pain (or temp > 37.5 C (99.5 F)).      Acetaminophen 500 MG capsule 2-3 capsules as needed Orally every 6 hrs      amiodarone (PACERONE) 200 MG tablet Take 1 tablet (200 mg total) by  mouth daily. 30 tablet 0    amLODipine (NORVASC) 10 MG tablet 1 tablet Orally Once a day (Patient not taking: Reported on 10/19/2021)      apixaban (ELIQUIS) 2.5 MG TABS tablet take one twice a day Orally twice a day for 90 days      aspirin EC 81 MG tablet       atorvastatin (LIPITOR) 40 MG tablet 1 tablet Orally Once a day      cadexomer iodine (IODOSORB) 0.9 % gel Apply 1 application. topically every other day as needed for wound care.      cholecalciferol (VITAMIN D3) 25 MCG (1000 UNIT) tablet Take 1 tablet (1,000 Units total) by mouth daily. 30 tablet 0    collagenase (SANTYL) 250 UNIT/GM ointment       Cyanocobalamin (VITAMIN B-12) 5000 MCG TBDP Take 5,000 mcg by mouth daily. 30 tablet 0    cyclobenzaprine (FLEXERIL) 5 MG tablet Take 1 tablet (5 mg total) by mouth 3 (three)  times daily as needed for muscle spasms. 30 tablet 0    denosumab (PROLIA) 60 MG/ML SOSY injection Inject 60 mg into the skin every 6 (six) months.      docusate sodium (COLACE) 100 MG capsule Take 1 capsule (100 mg total) by mouth 2 (two) times daily. (Patient taking differently: Take 100 mg by mouth every evening.)      EPINEPHrine 0.3 mg/0.3 mL IJ SOAJ injection as directed Injection      epoetin alfa-epbx (RETACRIT) 2000 UNIT/ML injection ? dose as directed per hematology Injection every 3 weeks.      ezetimibe (ZETIA) 10 MG tablet Take 1 tablet (10 mg total) by mouth daily. 30 tablet 0    finasteride (PROSCAR) 5 MG tablet Take 1 tablet (5 mg total) by mouth daily. 30 tablet 0    fish oil-omega-3 fatty acids 1000 MG capsule Take 1 capsule (1 g total) by mouth daily. 30 capsule 0    fluticasone (FLONASE) 50 MCG/ACT nasal spray as needed. (Patient not taking: Reported on 10/19/2021)      furosemide (LASIX) 40 MG tablet 1 tablet Orally Twice a day      gabapentin (NEURONTIN) 100 MG capsule Take 2 capsules by mouth in the morning, 1 capsule in the evening, and 2 capsules at bedtime. 90 capsule 0    galantamine (RAZADYNE ER) 8 MG 24 hr capsule Take 1 capsule (8 mg total) by mouth daily with breakfast. 30 capsule 0    isosorbide mononitrate (IMDUR) 60 MG 24 hr tablet Take 1 tablet (60 mg total) by mouth daily. 30 tablet 0    leflunomide (ARAVA) 10 MG tablet       leflunomide (ARAVA) 20 MG tablet Take 1/2 tablets (10 mg total) by mouth daily. 30 tablet 0    Magnesium 250 MG TABS Take 1 tablet (250 mg total) by mouth daily. 30 tablet 0    memantine (NAMENDA) 10 MG tablet Take 1 tablet (10 mg total) by mouth 2 (two) times daily. 60 tablet 0    memantine (NAMENDA) 5 MG tablet       metoprolol succinate (TOPROL-XL) 25 MG 24 hr tablet       naftifine (NAFTIN) 1 % cream Apply 1 application  topically daily as needed (fungal skin infection). (Patient not taking: Reported on 10/19/2021)      nitroGLYCERIN  (NITROSTAT) 0.4 MG SL tablet Place 1 tablet (0.4 mg total) under the tongue every 5 (five) minutes x 3 doses as needed for chest  pain. 30 tablet 3    pantoprazole (PROTONIX) 40 MG tablet 1 tablet EVERY day Oral Once a day for 90 days      polyethylene glycol (MIRALAX) 17 g packet 1 packet mixed with 8 ounces of fluid Orally Once a day for 30 day(s)      potassium chloride (KLOR-CON M) 10 MEQ tablet Take 1 tablet by mouth everyday with furosemide 30 tablet 0    predniSONE (DELTASONE) 1 MG tablet Take 3 tablets (3 mg total) by mouth daily with breakfast. 30 tablet 0    Psyllium Husk POWD Take 1 Scoop by mouth daily.       tamsulosin (FLOMAX) 0.4 MG CAPS capsule Take 1 capsule (0.4 mg total) by mouth daily after supper. 30 capsule 0    torsemide (DEMADEX) 20 MG tablet Take 2 tablets (40 mg total) by mouth daily. (Patient not taking: Reported on 10/19/2021) 60 tablet 0    traMADol (ULTRAM) 50 MG tablet Take 1/2 tablets (25 mg total) by mouth every 6 (six) hours as needed for moderate pain. 30 tablet 0    triamcinolone cream (KENALOG) 0.1 % 1 application External 2-3 times a day for up to 14 days for 14 days (Patient not taking: Reported on 10/19/2021)      vitamin C (ASCORBIC ACID) 500 MG tablet Take 1 tablet (500 mg total) by mouth daily. 30 tablet 0     Scheduled:   atorvastatin  40 mg Oral Daily   galantamine  8 mg Oral Q breakfast   isosorbide mononitrate  60 mg Oral Daily   memantine  10 mg Oral BID   sodium chloride flush  3 mL Intravenous Q12H   tamsulosin  0.4 mg Oral QPC supper   Infusions:   heparin 1,400 Units/hr (11/02/21 2204)   PRN: acetaminophen **OR** acetaminophen, albuterol, hydrALAZINE, traMADol  Assessment: 38 yom with a history of HTN, HLD, CAD s/p CABG, AF s/p maze on apixaban, HF, s/p TAVR, CVA, PVD, AAA, OSA on CPAP. Patient is presenting with wound infection. Last dose of Eliquis 11/2 AM. Pharmacy consulted to dose heparin while determining surgical  intervention.  Given recent Eliquis use, will require aPTT monitoring until levels are correlating.  aPTT therapeutic at 90 while on heparin 1400 units/hr. Heparin level remains > 1.1 units/mL as expected. No signs of bleeding. CBC stable.  Goal of Therapy:  Heparin level 0.3-0.7 units/ml aPTT 66-102 seconds Monitor platelets by anticoagulation protocol: Yes   Plan:  Continue heparin 1400 units/hr Check confirmatory aPTT at 1400 Daily aPTT/heparin level until correlating Monitor for signs/symptoms of bleeding.  Erskine Speed, PharmD Clinical Pharmacist 11/03/2021 7:23 AM

## 2021-11-03 NOTE — Progress Notes (Signed)
Pt placed on home cpap with no complications. Will continue to monitor.

## 2021-11-04 DIAGNOSIS — I10 Essential (primary) hypertension: Secondary | ICD-10-CM | POA: Diagnosis not present

## 2021-11-04 DIAGNOSIS — F03A0 Unspecified dementia, mild, without behavioral disturbance, psychotic disturbance, mood disturbance, and anxiety: Secondary | ICD-10-CM

## 2021-11-04 DIAGNOSIS — L97519 Non-pressure chronic ulcer of other part of right foot with unspecified severity: Secondary | ICD-10-CM | POA: Diagnosis not present

## 2021-11-04 DIAGNOSIS — I5032 Chronic diastolic (congestive) heart failure: Secondary | ICD-10-CM | POA: Diagnosis not present

## 2021-11-04 DIAGNOSIS — I480 Paroxysmal atrial fibrillation: Secondary | ICD-10-CM | POA: Diagnosis not present

## 2021-11-04 LAB — BASIC METABOLIC PANEL
Anion gap: 7 (ref 5–15)
BUN: 19 mg/dL (ref 8–23)
CO2: 24 mmol/L (ref 22–32)
Calcium: 9.6 mg/dL (ref 8.9–10.3)
Chloride: 106 mmol/L (ref 98–111)
Creatinine, Ser: 1.35 mg/dL — ABNORMAL HIGH (ref 0.61–1.24)
GFR, Estimated: 49 mL/min — ABNORMAL LOW (ref >60)
Glucose, Bld: 107 mg/dL — ABNORMAL HIGH (ref 70–99)
Potassium: 3.9 mmol/L (ref 3.5–5.1)
Sodium: 137 mmol/L (ref 135–145)

## 2021-11-04 LAB — GLUCOSE, CAPILLARY
Glucose-Capillary: 112 mg/dL — ABNORMAL HIGH (ref 70–99)
Glucose-Capillary: 120 mg/dL — ABNORMAL HIGH (ref 70–99)
Glucose-Capillary: 115 mg/dL — ABNORMAL HIGH (ref 70–99)

## 2021-11-04 LAB — APTT
aPTT: 102 seconds — ABNORMAL HIGH (ref 24–36)
aPTT: 70 seconds — ABNORMAL HIGH (ref 24–36)

## 2021-11-04 LAB — CBC
HCT: 27.9 % — ABNORMAL LOW (ref 39.0–52.0)
Hemoglobin: 8.6 g/dL — ABNORMAL LOW (ref 13.0–17.0)
MCH: 28.8 pg (ref 26.0–34.0)
MCHC: 30.8 g/dL (ref 30.0–36.0)
MCV: 93.3 fL (ref 80.0–100.0)
Platelets: 129 10*3/uL — ABNORMAL LOW (ref 150–400)
RBC: 2.99 MIL/uL — ABNORMAL LOW (ref 4.22–5.81)
RDW: 16.3 % — ABNORMAL HIGH (ref 11.5–15.5)
WBC: 4.9 10*3/uL (ref 4.0–10.5)
nRBC: 0 % (ref 0.0–0.2)

## 2021-11-04 LAB — MAGNESIUM: Magnesium: 2.2 mg/dL (ref 1.7–2.4)

## 2021-11-04 LAB — HEPARIN LEVEL (UNFRACTIONATED)
Heparin Unfractionated: 0.92 IU/mL — ABNORMAL HIGH (ref 0.30–0.70)
Heparin Unfractionated: 0.71 IU/mL — ABNORMAL HIGH (ref 0.30–0.70)

## 2021-11-04 MED ORDER — VITAMIN B-12 1000 MCG PO TABS
1000.0000 ug | ORAL_TABLET | Freq: Every day | ORAL | Status: DC
  Administered 2021-11-04 – 2021-11-14 (×9): 1000 ug via ORAL
  Filled 2021-11-04 (×10): qty 1

## 2021-11-04 MED ORDER — ATORVASTATIN CALCIUM 40 MG PO TABS
40.0000 mg | ORAL_TABLET | Freq: Every day | ORAL | Status: DC
  Administered 2021-11-04 – 2021-11-14 (×10): 40 mg via ORAL
  Filled 2021-11-04 (×10): qty 1

## 2021-11-04 MED ORDER — METOPROLOL SUCCINATE ER 25 MG PO TB24
25.0000 mg | ORAL_TABLET | Freq: Every day | ORAL | Status: DC
  Administered 2021-11-04 – 2021-11-11 (×7): 25 mg via ORAL
  Filled 2021-11-04 (×8): qty 1

## 2021-11-04 MED ORDER — NITROGLYCERIN 0.4 MG SL SUBL: 0.4000 mg | SUBLINGUAL_TABLET | SUBLINGUAL | Status: DC | PRN

## 2021-11-04 MED ORDER — DOCUSATE SODIUM 100 MG PO CAPS
100.0000 mg | ORAL_CAPSULE | Freq: Two times a day (BID) | ORAL | Status: DC
  Administered 2021-11-04 – 2021-11-14 (×17): 100 mg via ORAL
  Filled 2021-11-04 (×19): qty 1

## 2021-11-04 MED ORDER — VITAMIN B-12 5000 MCG PO TBDP: 5000.0000 ug | ORAL_TABLET | Freq: Every day | ORAL | Status: DC

## 2021-11-04 NOTE — Progress Notes (Addendum)
Loyal, [name] [name] ([id number]) [id number] Page 1 of 13 Visit Report for 11/01/2021 Arrival Information Details Patient Name: Date of Service: [name] [name] ID [name]. 11/01/2021 2:30 PM Medical Record Number: [id number] Patient Account Number: [id number] Date of Birth/Sex: Treating RN: 1926/05/18 (88 y.o. [name] [name] Primary Care Whitney Bingaman: [name] Other Clinician: Referring Janayla Marik: Treating [place] Chelf/Extender: Eli Hose in Treatment: 71 Visit Information History Since Last Visit Added or deleted any medications: No Patient Arrived: Wheel Chair Any new allergies or adverse reactions: No Arrival Time: 14:50 Had a fall or experienced change in No Accompanied By: wife activities of daily living that may affect Transfer Assistance: Manual risk of falls: Patient Identification Verified: Yes Signs or symptoms of abuse/neglect since last visito No Secondary Verification Process Completed: Yes Hospitalized since last visit: No Patient Requires Transmission-Based Precautions: No Implantable device outside of the clinic excluding No Patient Has Alerts: Yes cellular tissue based products placed in the center Patient Alerts: Patient on Blood Thinner since last visit: Has Dressing in Place as Prescribed: Yes Has Compression in Place as Prescribed: Yes Pain Present Now: No Electronic Signature(s) Signed: 11/03/2021 5:25:29 PM By: Deon Pilling RN, BSN Entered By: Deon Pilling on 11/01/2021 15:17:23 -------------------------------------------------------------------------------- Clinic Level of Care Assessment Details Patient Name: Date of Service: [name] [name], [name] V ID [name]. 11/01/2021 2:30 PM Medical Record Number: [id number] Patient Account Number: [id number] Date of Birth/Sex: Treating RN: 1926/02/26 (88 y.o. [name] [name] Primary Care Eshika Reckart: [name] Other Clinician: Referring Seamus Warehime: Treating Gumaro Brightbill/Extender: Eli Hose in Treatment: 44 Clinic Level of Care Assessment Items TOOL 4 Quantity Score X- 1 0 Use when only an EandM is performed on FOLLOW-UP visit ASSESSMENTS - Nursing Assessment / Reassessment X- 1 10 Reassessment of Co-morbidities (includes updates in patient status) X- 1 5 Reassessment of Adherence to Treatment Plan ASSESSMENTS - Wound and Skin A ssessment / Reassessment '[]'$  - 0 Simple Wound Assessment / Reassessment - one wound X- 6 5 Complex Wound Assessment / Reassessment - multiple wounds '[]'$  - 0 Dermatologic / Skin Assessment (not related to wound area) ASSESSMENTS - Focused Assessment X- 2 5 Circumferential Edema Measurements - multi extremities '[]'$  - 0 Nutritional Assessment / Counseling / [name], [name] [name] ([id number]) [id number] Page 2 of 13 '[]'$  - 0 Lower Extremity Assessment (monofilament, tuning fork, pulses) '[]'$  - 0 Peripheral Arterial Disease Assessment (using hand held doppler) ASSESSMENTS - Ostomy and/or Continence Assessment and Care '[]'$  - 0 Incontinence Assessment and Management '[]'$  - 0 Ostomy Care Assessment and Management (repouching, etc.) PROCESS - Coordination of Care '[]'$  - 0 Simple Patient / Family Education for ongoing care X- 1 20 Complex (extensive) Patient / Family Education for ongoing care X- 1 10 Staff obtains Programmer, systems, Records, T Results / Process Orders est '[]'$  - 0 Staff telephones HHA, Nursing Homes / Clarify orders / etc '[]'$  - 0 Routine Transfer to another Facility (non-emergent condition) '[]'$  - 0 Routine Hospital Admission (non-emergent condition) '[]'$  - 0 New Admissions / Biomedical engineer / Ordering NPWT Apligraf, etc. , '[]'$  - 0 Emergency Hospital Admission (emergent condition) '[]'$  - 0 Simple Discharge Coordination X- 1 15 Complex (extensive) Discharge Coordination PROCESS - Special Needs '[]'$  - 0 Pediatric / Minor Patient Management '[]'$  - 0 Isolation Patient Management '[]'$   - 0 Hearing / Language / Visual special needs '[]'$  - 0 Assessment of Community assistance (transportation, D/C planning, etc.) '[]'$  - 0 Additional assistance / Altered  mentation '[]'$  - 0 Support Surface(s) Assessment (bed, cushion, seat, etc.) INTERVENTIONS - Wound Cleansing / Measurement '[]'$  - 0 Simple Wound Cleansing - one wound X- 7 5 Complex Wound Cleansing - multiple wounds X- 1 5 Wound Imaging (photographs - any number of wounds) '[]'$  - 0 Wound Tracing (instead of photographs) '[]'$  - 0 Simple Wound Measurement - one wound X- 7 5 Complex Wound Measurement - multiple wounds INTERVENTIONS - Wound Dressings '[]'$  - 0 Small Wound Dressing one or multiple wounds '[]'$  - 0 Medium Wound Dressing one or multiple wounds X- 7 20 Large Wound Dressing one or multiple wounds X- 1 5 Application of Medications - topical '[]'$  - 0 Application of Medications - injection INTERVENTIONS - Miscellaneous '[]'$  - 0 External ear exam '[]'$  - 0 Specimen Collection (cultures, biopsies, blood, body fluids, etc.) '[]'$  - 0 Specimen(s) / Culture(s) sent or taken to Lab for analysis '[]'$  - 0 Patient Transfer (multiple staff / Civil Service fast streamer / Similar devices) '[]'$  - 0 Simple Staple / Suture removal (25 or less) '[]'$  - 0 Complex Staple / Suture removal (26 or more) '[]'$  - 0 Hypo / Hyperglycemic Management (close monitor of Blood Glucose) [name], [name] [name] ([id number]) [id number] Page 3 of 13 '[]'$  - 0 Ankle / Brachial Index (ABI) - do not check if billed separately X- 1 5 Vital Signs Has the patient been seen at the hospital within the last three years: Yes Total Score: 325 Level Of Care: New/Established - Level 5 Electronic Signature(s) Signed: 11/03/2021 5:25:29 PM By: Deon Pilling RN, BSN Entered By: Deon Pilling on 11/01/2021 15:48:10 -------------------------------------------------------------------------------- Encounter Discharge Information Details Patient Name: Date of Service: [name] [name], [name] V  ID [name]. 11/01/2021 2:30 PM Medical Record Number: [id number] Patient Account Number: [id number] Date of Birth/Sex: Treating RN: 05/31/1926 (88 y.o. [name] [name] Primary Care Lanny Lipkin: [name] Other Clinician: Referring Nyrah Demos: Treating Astria Jordahl/Extender: Eli Hose in Treatment: 81 Encounter Discharge Information Items Discharge Condition: Stable Ambulatory Status: Ambulatory Discharge Destination: Home Transportation: Private Auto Accompanied By: wife Schedule Follow-up Appointment: Yes Clinical Summary of Care: Patient Declined Electronic Signature(s) Signed: 11/03/2021 5:25:29 PM By: Deon Pilling RN, BSN Entered By: Deon Pilling on 11/01/2021 15:48:50 -------------------------------------------------------------------------------- Lower Extremity Assessment Details Patient Name: Date of Service: [name] [name], [name] V ID [name]. 11/01/2021 2:30 PM Medical Record Number: [id number] Patient Account Number: [id number] Date of Birth/Sex: Treating RN: [birth year] (88 y.o. [name] [name] Primary Care Sennie Borden: [name] Other Clinician: Referring Omero Kowal: Treating Parker Wherley/Extender: [name] in Treatment: 58 Edema Assessment Assessed: [Left: Yes] [Right: Yes] Edema: [Left: Yes] [Right: Yes] Calf Left: Right: Point of Measurement: 34 cm From Medial Instep 37 cm 36 cm Ankle Left: Right: Point of Measurement: 11 cm From Medial Instep 26 cm 26 cm Vascular Assessment Left: [[id number] Page 4 of 13Right:] Pulses: Dorsalis Pedis Palpable: [[id number] Page 4 of 13Yes Yes] Electronic Signature(s) Signed: 11/03/2021 5:25:29 PM By: Deon Pilling RN, BSN Entered By: Deon Pilling on 11/01/2021 15:18:21 -------------------------------------------------------------------------------- Multi-Disciplinary Care Plan Details Patient Name: Date of Service: [name] [name], [name] V ID [name]. 11/01/2021  2:30 PM Medical Record Number: [id number] Patient Account Number: [id number] Date of Birth/Sex: Treating RN: August 30, 1926 (88 y.o. [name] [name] Primary Care Callie Bunyard: [name] Other Clinician: Referring Johnston Maddocks: Treating Breea Loncar/Extender: Eli Hose in Treatment: 6 Active Inactive Electronic Signature(s) Signed: 12/05/2021 4:51:59 PM By: Deon Pilling RN, BSN Previous Signature: 11/03/2021 5:25:29 PM Version By:  Deon Pilling RN, BSN Entered By: Deon Pilling on 12/05/2021 16:51:59 -------------------------------------------------------------------------------- Pain Assessment Details Patient Name: Date of Service: [name] [name] ID [name]. 11/01/2021 2:30 PM Medical Record Number: [id number] Patient Account Number: [id number] Date of Birth/Sex: Treating RN: 18-Aug-1926 (88 y.o. [name] [name] Primary Care Sheina Mcleish: [name] Other Clinician: Referring Narvel Kozub: Treating Starkeisha Vanwinkle/Extender: Eli Hose in Treatment: 81 Active Problems Location of Pain Severity and Description of Pain Patient Has Paino No Site Locations Rate the pain. Current Pain Level: 0 ANTWION, CARPENTER [name] ([id number]) [id number] Page 5 of 13 Pain Management and Medication Current Pain Management: Medication: No Cold Application: No Rest: No Massage: No Activity: No T.E.N.S.: No Heat Application: No Leg drop or elevation: No Is the Current Pain Management Adequate: Adequate How does your wound impact your activities of daily livingo Sleep: No Bathing: No Appetite: No Relationship With Others: No Bladder Continence: No Emotions: No Bowel Continence: No Work: No Toileting: No Drive: No Dressing: No Hobbies: No Notes per patient left foot and all toes numb. Electronic Signature(s) Signed: 11/03/2021 5:25:29 PM By: Deon Pilling RN, BSN Entered By: Deon Pilling on 11/01/2021  15:17:53 -------------------------------------------------------------------------------- Patient/Caregiver Education Details Patient Name: Date of Service: [name] [name], [name] [name] ID [name]. 11/1/2023andnbsp2:30 PM Medical Record Number: [id number] Patient Account Number: [id number] Date of Birth/Gender: Treating RN: 13-May-1926 (88 y.o. [name] [name] Primary Care Physician: [name] Other Clinician: Referring Physician: Treating Physician/Extender: Eli Hose in Treatment: 63 Education Assessment Education Provided To: Patient Education Topics Provided Safety: Methods: Explain/Verbal Responses: State content correctly [employer]) Signed: 11/03/2021 5:25:29 PM By: Deon Pilling RN, BSN Entered By: Deon Pilling on 11/01/2021 15:45:56 -------------------------------------------------------------------------------- Wound Assessment Details Patient Name: Date of Service: [name] [name], [name] V ID [name]. 11/01/2021 2:30 PM Medical Record Number: [id number] Patient Account Number: [id number] Date of Birth/Sex: Treating RN: [birth year] (88 y.o. [name] [name] Primary Care Cedricka Sackrider: [name] Other Clinician: Referring Tomesha Sargent: Treating Brie Eppard/Extender: Eli Hose in Treatment: [address], Cristino [name] ([id number]) [id number] Page 6 of 13 Wound Status Wound Number: 14 Primary Open Surgical Wound Etiology: Wound Location: Left, Anterior Lower Leg Wound Open Wounding Event: Surgical Injury Status: Date Acquired: 05/26/2021 Comorbid Cataracts, Sleep Apnea, Arrhythmia, Congestive Heart Failure, Weeks Of Treatment: 17 History: Coronary Artery Disease, Hypertension, Myocardial Infarction, Clustered Wound: No End Stage Renal Disease, Gout, Dementia, Neuropathy Photos Wound Measurements Length: (cm) 1 Width: (cm) 1.3 Depth: (cm) 0.1 Area: (cm) 1.021 Volume: (cm) 0.102 % Reduction in Area: 74% % Reduction in  Volume: 87% Epithelialization: Large (67-100%) Tunneling: No Undermining: No Wound Description Classification: Full Thickness Without Exposed Suppo Wound Margin: Epibole Exudate Amount: Medium Exudate Type: Serosanguineous Exudate Color: red, brown rt Structures Foul Odor After Cleansing: No Slough/Fibrino No Wound Bed Granulation Amount: Large (67-100%) Exposed Structure Granulation Quality: Red, Friable Fascia Exposed: No Necrotic Amount: None Present (0%) Fat Layer (Subcutaneous Tissue) Exposed: Yes Tendon Exposed: No Muscle Exposed: No Joint Exposed: No Bone Exposed: No Periwound Skin Texture Texture Color No Abnormalities Noted: Yes No Abnormalities Noted: No Atrophie Blanche: No Moisture Cyanosis: No No Abnormalities Noted: No Ecchymosis: No Dry / Scaly: No Erythema: No Maceration: No Hemosiderin Staining: No Mottled: No Pallor: No Rubor: No Temperature / Pain Temperature: No Abnormality Electronic Signature(s) Signed: 11/03/2021 5:25:29 PM By: Deon Pilling RN, BSN Entered By: Deon Pilling on 11/01/2021 15:24:39 Wound Assessment Details -------------------------------------------------------------------------------- [name] [name] ([id number]) [id number] Page 7 of  13 Patient Name: Date of Service: [name] [name] ID [name]. 11/01/2021 2:30 PM Medical Record Number: [id number] Patient Account Number: [id number] Date of Birth/Sex: Treating RN: 1926-12-20 (88 y.o. [name] [name], [name] [name] Primary Care Daviona Herbert: [name] Other Clinician: Referring Trinidy Masterson: Treating Natasia Sanko/Extender: Eli Hose in Treatment: 1 Wound Status Wound Number: 15 Primary Pressure Ulcer Etiology: Wound Location: Right Calcaneus Secondary Abrasion Wounding Event: Trauma Etiology: Date Acquired: 07/12/2021 Wound Open Weeks Of Treatment: 15 Status: Clustered Wound: No Comorbid Cataracts, Sleep Apnea, Arrhythmia, Congestive Heart  Failure, History: Coronary Artery Disease, Hypertension, Myocardial Infarction, End Stage Renal Disease, Gout, Dementia, Neuropathy Photos Wound Measurements Length: (cm) 1.3 % Reduction in Area: 78.3% Width: (cm) 2.3 % Reduction in Volume: 56.5% Depth: (cm) 0.2 Epithelialization: Medium (34-66%) Area: (cm) 2.348 Tunneling: No Volume: (cm) 0.47 Undermining: No Wound Description Classification: Category/Stage III Foul Odor After Cleansing: No Wound Margin: Distinct, outline attached Slough/Fibrino Yes Exudate Amount: Medium Exudate Type: Serosanguineous Exudate Color: red, brown Wound Bed Granulation Amount: None Present (0%) Exposed Structure Necrotic Amount: Large (67-100%) Fascia Exposed: No Necrotic Quality: Eschar, Adherent Slough Fat Layer (Subcutaneous Tissue) Exposed: Yes Tendon Exposed: No Muscle Exposed: No Joint Exposed: No Bone Exposed: No Periwound Skin Texture Texture Color No Abnormalities Noted: No No Abnormalities Noted: No Callus: Yes Atrophie Blanche: No Crepitus: No Cyanosis: No Excoriation: No Ecchymosis: No Induration: No Erythema: No Rash: No Hemosiderin Staining: No Scarring: No Mottled: No Pallor: No Moisture Rubor: No No Abnormalities Noted: No Dry / Scaly: No Temperature / Pain Maceration: No Temperature: No Abnormality Electronic Signature(s) Signed: 11/03/2021 5:25:29 PM By: Deon Pilling RN, BSN Entered By: Deon Pilling on 11/01/2021 15:21:44 [name] [name], [name] [name] ([id number]) [id number] Page 8 of 13 -------------------------------------------------------------------------------- Wound Assessment Details Patient Name: Date of Service: [name] [name] ID [name]. 11/01/2021 2:30 PM Medical Record Number: [id number] Patient Account Number: [id number] Date of Birth/Sex: Treating RN: [birth year] (88 y.o. [name] [name], [name] [name] Primary Care Draylen Lobue: [name] Other Clinician: Referring Jniyah Dantuono: Treating  Kaiden Dardis/Extender: Eli Hose in Treatment: 19 Wound Status Wound Number: 20 Primary Abrasion Etiology: Wound Location: Left T Second oe Wound Open Wounding Event: Trauma Status: Date Acquired: 09/06/2021 Comorbid Cataracts, Sleep Apnea, Arrhythmia, Congestive Heart Failure, Weeks Of Treatment: 6 History: Coronary Artery Disease, Hypertension, Myocardial Infarction, Clustered Wound: No End Stage Renal Disease, Gout, Dementia, Neuropathy Photos Wound Measurements Length: (cm) 1.5 Width: (cm) 1.5 Depth: (cm) 0.1 Area: (cm) 1.767 Volume: (cm) 0.177 % Reduction in Area: -1025.5% % Reduction in Volume: -1006.2% Epithelialization: None Tunneling: No Undermining: No Wound Description Classification: Full Thickness With Exposed Suppo Wound Margin: Distinct, outline attached Exudate Amount: Medium Exudate Type: Serosanguineous Exudate Color: red, brown rt Structures Foul Odor After Cleansing: No Slough/Fibrino Yes Wound Bed Granulation Amount: None Present (0%) Exposed Structure Necrotic Amount: Large (67-100%) Fascia Exposed: No Necrotic Quality: Eschar, Adherent Slough Fat Layer (Subcutaneous Tissue) Exposed: Yes Tendon Exposed: No Muscle Exposed: No Joint Exposed: No Bone Exposed: Yes Periwound Skin Texture Texture Color No Abnormalities Noted: No No Abnormalities Noted: Yes Callus: No Crepitus: No Excoriation: No Induration: No Rash: No Scarring: No Moisture No Abnormalities Noted: No Dry / Scaly: No [name] [name], [name] [name] ([id number]) [id number] Page 9 of 13 Maceration: No Electronic Signature(s) Signed: 11/03/2021 5:25:29 PM By: Deon Pilling RN, BSN Entered By: Deon Pilling on 11/01/2021 15:23:10 -------------------------------------------------------------------------------- Wound Assessment Details Patient Name: Date of Service: [name] [name], [name] V ID [name]. 11/01/2021 2:30 PM Medical Record Number:  [id number] Patient  Account Number: [id number] Date of Birth/Sex: Treating RN: [birth year] (88 y.o. [name] [name], [name] [name] Primary Care Maresha Anastos: [name] Other Clinician: Referring Gaylene Moylan: Treating Tamre Cass/Extender: Eli Hose in Treatment: 18 Wound Status Wound Number: 21 Primary Abrasion Etiology: Wound Location: Right T Second oe Wound Open Wounding Event: Trauma Status: Date Acquired: 09/13/2021 Comorbid Cataracts, Sleep Apnea, Arrhythmia, Congestive Heart Failure, Weeks Of Treatment: 6 History: Coronary Artery Disease, Hypertension, Myocardial Infarction, Clustered Wound: No End Stage Renal Disease, Gout, Dementia, Neuropathy Photos Wound Measurements Length: (cm) 1.5 Width: (cm) 1.5 Depth: (cm) 0.1 Area: (cm) 1.767 Volume: (cm) 0.177 % Reduction in Area: -1025.5% % Reduction in Volume: -1006.2% Epithelialization: None Tunneling: No Undermining: No Wound Description Classification: Full Thickness With Exposed Suppo Wound Margin: Distinct, outline attached Exudate Amount: Medium Exudate Type: Serosanguineous Exudate Color: red, brown rt Structures Foul Odor After Cleansing: Yes Due to Product Use: No Slough/Fibrino Yes Wound Bed Granulation Amount: None Present (0%) Exposed Structure Necrotic Amount: Large (67-100%) Fascia Exposed: No Necrotic Quality: Eschar, Adherent Slough Fat Layer (Subcutaneous Tissue) Exposed: Yes Tendon Exposed: No Muscle Exposed: No Joint Exposed: No Bone Exposed: Yes Periwound Skin Texture Texture Color No Abnormalities Noted: No No Abnormalities Noted: No Callus: No Atrophie BlancheRODGER, [name] [name] [name] ([id number]) [id number] Page 10 of 13 Crepitus: No Cyanosis: No Excoriation: No Ecchymosis: Yes Induration: No Erythema: No Rash: No Hemosiderin Staining: No Scarring: No Mottled: No Pallor: No Moisture Rubor: No No Abnormalities Noted: No Dry / Scaly: No Maceration: No Electronic  Signature(s) Signed: 11/03/2021 5:25:29 PM By: Deon Pilling RN, BSN Entered By: Deon Pilling on 11/01/2021 15:24:04 -------------------------------------------------------------------------------- Wound Assessment Details Patient Name: Date of Service: [name] [name], [name] V ID [name]. 11/01/2021 2:30 PM Medical Record Number: [id number] Patient Account Number: [id number] Date of Birth/Sex: Treating RN: Dec 17, 1926 (88 y.o. [name] [name], [name] [name] Primary Care Nashira Mcglynn: [name] Other Clinician: Referring Amayiah Gosnell: Treating Nosson Wender/Extender: Eli Hose in Treatment: 56 Wound Status Wound Number: 22 Primary Lymphedema Etiology: Wound Location: Right Achilles Wound Open Wounding Event: Gradually Appeared Status: Date Acquired: 09/27/2021 Comorbid Cataracts, Sleep Apnea, Arrhythmia, Congestive Heart Failure, Weeks Of Treatment: 4 History: Coronary Artery Disease, Hypertension, Myocardial Infarction, Clustered Wound: No End Stage Renal Disease, Gout, Dementia, Neuropathy Photos Wound Measurements Length: (cm) 1.4 Width: (cm) 1.7 Depth: (cm) 0.2 Area: (cm) 1.869 Volume: (cm) 0.374 % Reduction in Area: -138.1% % Reduction in Volume: -373.4% Epithelialization: None Tunneling: No Undermining: No Wound Description Classification: Full Thickness Without Exposed Suppo Wound Margin: Distinct, outline attached Exudate Amount: Medium Exudate Type: Serosanguineous Exudate Color: red, brown rt Structures Foul Odor After Cleansing: No Slough/Fibrino Yes Wound Bed Granulation Amount: Small (1-33%) Exposed Structure Granulation Quality: Red, Pink Fascia Exposed: No Necrotic Amount: Large (67-100%) Fat Layer (Subcutaneous Tissue) Exposed: Yes Necrotic Quality: Adherent Slough Tendon Exposed: No Muscle Exposed: No [name] [name], [name] [name] ([id number]) [id number] Page 11 of 13 Joint Exposed: No Bone Exposed: No Periwound Skin Texture Texture Color No  Abnormalities Noted: No No Abnormalities Noted: No Callus: No Atrophie Blanche: No Crepitus: No Cyanosis: No Excoriation: No Ecchymosis: No Induration: No Erythema: No Rash: No Hemosiderin Staining: No Scarring: No Mottled: No Pallor: No Moisture Rubor: No No Abnormalities Noted: No Dry / Scaly: No Temperature / Pain Maceration: No Temperature: No Abnormality Electronic Signature(s) Signed: 11/03/2021 5:25:29 PM By: Deon Pilling RN, BSN Entered By: Deon Pilling on 11/01/2021 15:21:15 -------------------------------------------------------------------------------- Wound Assessment Details Patient Name: Date of Service: [name] [name], [name]  V ID [name]. 11/01/2021 2:30 PM Medical Record Number: [id number] Patient Account Number: [id number] Date of Birth/Sex: Treating RN: [birth year] (88 y.o. [name] [name], [name] [name] Primary Care Santhosh Gulino: [name] Other Clinician: Referring Rayen Palen: Treating [name] [name]/Extender: Eli Hose in Treatment: 87 Wound Status Wound Number: 23 Primary Skin T ear Etiology: Wound Location: Right, Anterior Lower Leg Wound Open Wounding Event: Trauma Status: Date Acquired: 10/05/2021 Comorbid Cataracts, Sleep Apnea, Arrhythmia, Congestive Heart Failure, Weeks Of Treatment: 3 History: Coronary Artery Disease, Hypertension, Myocardial Infarction, Clustered Wound: No End Stage Renal Disease, Gout, Dementia, Neuropathy Photos Wound Measurements Length: (cm) 0.9 Width: (cm) 0.4 Depth: (cm) 0.1 Area: (cm) 0.283 Volume: (cm) 0.028 % Reduction in Area: 84.6% % Reduction in Volume: 84.8% Epithelialization: Large (67-100%) Tunneling: No Undermining: No Wound Description Classification: Full Thickness Without Exposed Support Structures Wound Margin: Distinct, outline attached Exudate Amount: Medium Exudate Type: Serosanguineous Exudate Color: red, brown [name] [name], [name] [name] ([id number]) Foul Odor After Cleansing: No Slough/Fibrino  Yes [id number] Page 12 of 13 Wound Bed Granulation Amount: Large (67-100%) Exposed Structure Granulation Quality: Pink Fascia Exposed: No Necrotic Amount: Small (1-33%) Fat Layer (Subcutaneous Tissue) Exposed: Yes Necrotic Quality: Adherent Slough Tendon Exposed: No Muscle Exposed: No Joint Exposed: No Bone Exposed: No Periwound Skin Texture Texture Color No Abnormalities Noted: No No Abnormalities Noted: No Callus: No Atrophie Blanche: No Crepitus: No Cyanosis: No Excoriation: No Ecchymosis: No Induration: No Erythema: No Rash: No Hemosiderin Staining: No Scarring: No Mottled: No Pallor: No Moisture Rubor: No No Abnormalities Noted: No Dry / Scaly: No Maceration: No Electronic Signature(s) Signed: 11/03/2021 5:25:29 PM By: Deon Pilling RN, BSN Entered By: Deon Pilling on 11/01/2021 15:20:01 -------------------------------------------------------------------------------- Wound Assessment Details Patient Name: Date of Service: [name] [name], [name] V ID [name]. 11/01/2021 2:30 PM Medical Record Number: [id number] Patient Account Number: [id number] Date of Birth/Sex: Treating RN: 1926/03/23 (88 y.o. [name] [name], [name] [name] Primary Care Emiah Pellicano: [name] Other Clinician: Referring Onalee Steinbach: Treating Taniaya Rudder/Extender: Eli Hose in Treatment: 83 Wound Status Wound Number: 3 Primary Venous Leg Ulcer Etiology: Wound Location: Left, Lateral Ankle Wound Open Wounding Event: Gradually Appeared Status: Date Acquired: 10/12/2020 Comorbid Cataracts, Sleep Apnea, Arrhythmia, Congestive Heart Failure, Weeks Of Treatment: 50 History: Coronary Artery Disease, Hypertension, Myocardial Infarction, Clustered Wound: No End Stage Renal Disease, Gout, Dementia, Neuropathy Photos Wound Measurements Length: (cm) 0 Width: (cm) 0 Depth: (cm) 0 Area: (cm) 0 Volume: (cm) 0 [name] [name], [name] [name] ([id number]) % Reduction in Area: 100% % Reduction  in Volume: 100% Epithelialization: Large (67-100%) Tunneling: No Undermining: No [id number] Page 13 of 13 Wound Description Classification: Full Thickness Without Exposed Support Wound Margin: Epibole Exudate Amount: None Present Structures Foul Odor After Cleansing: No Slough/Fibrino No Wound Bed Granulation Amount: None Present (0%) Exposed Structure Necrotic Amount: None Present (0%) Fascia Exposed: No Fat Layer (Subcutaneous Tissue) Exposed: No Tendon Exposed: No Muscle Exposed: No Joint Exposed: No Bone Exposed: No Periwound Skin Texture Texture Color No Abnormalities Noted: No No Abnormalities Noted: No Callus: No Atrophie Blanche: No Crepitus: No Cyanosis: No Excoriation: No Ecchymosis: No Induration: No Erythema: No Rash: No Hemosiderin Staining: No Scarring: No Mottled: No Pallor: No Moisture Rubor: No No Abnormalities Noted: No Dry / Scaly: No Maceration: No Electronic Signature(s) Signed: 11/03/2021 5:25:29 PM By: Deon Pilling RN, BSN Entered By: Deon Pilling on 11/01/2021 15:25:08 -------------------------------------------------------------------------------- Vitals Details Patient Name: Date of Service: [name] [name], [name] V ID [name]. 11/01/2021 2:30 PM Medical Record Number: [id number] Patient Account  Number: [id number] Date of Birth/Sex: Treating RN: Apr 29, 1926 (88 y.o. [name] [name] Primary Care Makensie Mulhall: [name] Other Clinician: Referring Zaydee Aina: Treating Laree Garron/Extender: Eli Hose in Treatment: 42 Vital Signs Time Taken: 14:50 Temperature (F): 97.6 Height (in): 72 Pulse (bpm): 72 Weight (lbs): 223 Respiratory Rate (breaths/min): 20 Body Mass Index (BMI): 30.2 Blood Pressure (mmHg): 139/78 Reference Range: 80 - 120 mg / dl Electronic Signature(s) Signed: 11/03/2021 5:25:29 PM By: Deon Pilling RN, BSN Entered By: Deon Pilling on 11/01/2021 15:17:34

## 2021-11-04 NOTE — Progress Notes (Addendum)
PROGRESS NOTE    [name] [name]  MQK:[id number] DOB: May 12, 1926 DOA: 11/02/2021 PCP: [name], MD    Brief Narrative:   [name] [name] is a 88 years old male with past medical history significant of hypertension, hyperlipidemia, CAD s/p CABG, PAF s/p maze on chronic anticoagulation, systolic CHF, severe AS s/p TAVR, CVA with residual deficit, PVD, PMR, AAA, OSA on CPAP presented to the hospital with worsening of feet wounds.  Patient was seen at the wound care clinic and due to worsening wound of his left second toe he was sent to the hospital for further evaluation.  Patient did have history of stroke in May 2023 and has been having difficult time  with ADLs.  In the ED, patient was bradycardic and mildly hypotensive.  WBC was 5.1.  Creatinine 1.5.  X-ray of the left foot did not show any osseous abnormality.  Chest x-ray was negative for acute findings. Dr. Stann Mainland of orthopedics was consulted and notified Dr. Sharol Given of the patient being admitted to the hospital.  Vascular surgery was also consulted and patient was admitted hospital for further evaluation and treatment.    Assessment and Plan:  Principal Problem:   Skin ulcers of foot, bilateral (HCC) Active Problems:   Paroxysmal atrial fibrillation (HCC)   Essential hypertension   Chronic diastolic CHF (congestive heart failure) (HCC)   CAD (coronary artery disease), native coronary artery   PAD (peripheral artery disease) (HCC)   History of CVA with residual deficit   CKD (chronic kidney disease) stage 3, GFR 30-59 ml/min (HCC)   Polymyalgia rheumatica (HCC)   Dementia (HCC)   OSA (obstructive sleep apnea)   Skin ulcers of feet, bilateral, right heel pressure ulceration. Acute on chronic  ischemic ulcers..  Presenting with worsening ulceration on the left.,  Significant pressure ulcer on the right heel.  Changed was followed by wound care as outpatient.  X-ray of the foot without signs of osteomyelitis follow-up ABI.    ESR elevated at  103, CRP 5.0.  Continue to hold off with antibiotic at this time.Vascular surgery has seen the patient at this time and patient would like to proceed with aggressive treatment.  Plan is bilateral lower extremity angiography on 11/06/2021.    PAF on chronic anticoagulation Continue heparin drip at this time.  On Eliquis as outpatient, holding for now for potential procedure.  Essential hypertension Metoprolol listed in the [facility] but was not taking it.  We will continue to monitor closely.   Diastolic congestive heart failure Review of 2D echocardiogram from 05/2021 with left ventricle ejection fraction of  65 -70%.  Appears to be compensated at this time.  Supposed to be on metoprolol but not taking it.  Unsure.  On torsemide as outpatient.  We will hold for now.  CAD S/p CABG -Continue Imdur and statin   PAD Status post angioplasty of the right posterior tibial artery 10/2020.  Continue Lipitor.  Vascular surgery has been consulted, plan for ABI.   History of CVA with residual deficit Patient suffered left middle cerebral artery stroke in 05/2021 with residual right hand weakness and difficulty speaking.  Continue supportive care.  Continue statins.   CKD stage IIIb Creatinine 1.58 which appears near patient's baseline.  We will continue to monitor BMP while in the hospital.   History of rheumatoid arthritis/PMR Needing assistance with ADLs.  We will get PT evaluation.  Wheelchair-bound currently.  On leflunomide and prednisone.  We will hold leflunomide.  Continue prednisone.  Dementia  On Namenda and galantamine we will continue.   OSA -Continue CPAP at night     DVT prophylaxis: Heparin drip   Code Status:      Code Status: DNR  Disposition: Home  Status is: Inpatient  Remains inpatient appropriate because: Ischemic foot ulcers, vascular surgery evaluation, plan for angiogram 11/06/2021   Family Communication:  I spoke with the patient's spouse and updated her about the  clinical condition of the patient.  Consultants:  Vascular surgery,  Orthopedics  Procedures:  None  Antimicrobials:  None yet  Anti-infectives (From admission, onward)    None      Subjective:  Today, patient was seen and examined at bedside.  Complains of mild foot pain.  Still has not had a bowel movement but no nausea vomiting or abdominal pain.  Objective: Vitals:   11/03/21 1547 11/03/21 2058 11/04/21 0344 11/04/21 0729  BP: (!) 113/47 (!) 142/61 (!) 155/62 138/75  Pulse: (!) 59 67 68 74  Resp: 16   16  Temp: (!) 97.4 F (36.3 C) 97.9 F (36.6 C) (!) 97.5 F (36.4 C) 97.9 F (36.6 C)  TempSrc: Oral Oral Oral Oral  SpO2: 100% 96% 97% 96%  Weight:      Height:        Intake/Output Summary (Last 24 hours) at 11/04/2021 0906 Last data filed at 11/04/2021 0418 Gross per 24 hour  Intake 385.28 ml  Output 400 ml  Net -14.72 ml    Filed Weights   11/02/21 1045  Weight: 98 kg    Physical Examination: Body mass index is 27.73 kg/m.   General:  Average built, not in obvious distress alert awake and Communicative HENT:   No scleral pallor or icterus noted. Oral mucosa is moist.  Chest:  Clear breath sounds.  Diminished breath sounds bilaterally. No crackles or wheezes.  CVS: S1 &S2 heard. No murmur.  Regular rate and rhythm. Abdomen: Soft, nontender, nondistended.  Bowel sounds are heard.   Extremities: No cyanosis, clubbing or edema.  Peripheral pulses are palpable.  Bilateral lower extremity wounds with dressing in place, unable to palpate dorsalis pedis. Psych: Alert, awake and oriented, normal mood CNS:  No cranial nerve deficits.  Power equal in all extremities.   Skin: Warm and dry.  Bilateral lower extremity wounds with dressing in place.  Data Reviewed:   CBC: Recent Labs  Lab 11/02/21 1212 11/03/21 0545 11/04/21 0233  WBC 5.1 5.5 4.9  NEUTROABS 3.3  --   --   HGB 9.2* 8.5* 8.6*  HCT 30.7* 26.9* 27.9*  MCV 94.5 91.8 93.3  PLT 149* 129*  129*     Basic Metabolic Panel: Recent Labs  Lab 11/02/21 1212 11/04/21 0233  NA 140 137  K 3.7 3.9  CL 102 106  CO2 28 24  GLUCOSE 98 107*  BUN 28* 19  CREATININE 1.58* 1.35*  CALCIUM 10.2 9.6  MG  --  2.2     Liver Function Tests: Recent Labs  Lab 11/02/21 1212  AST 21  ALT 17  ALKPHOS 114  BILITOT 0.4  PROT 7.0  ALBUMIN 3.1*      Radiology Studies: VAS [place] ABI WITH/WO TBI  Result Date: 11/02/2021  LOWER EXTREMITY DOPPLER STUDY Patient Name:  REDDING CLOE  Date of Exam:   11/02/2021 Medical Rec #: [id number]      Accession #:    [id number] Date of Birth: Sep 04, 1926       Patient Gender: M Patient Age:  89 years Exam Location:  [facility] Procedure:      VAS [place] ABI WITH/WO TBI Referring Phys: KELSEY FORD --------------------------------------------------------------------------------  Indications: Ulceration. High Risk Factors: Hypertension, hyperlipidemia, past history of smoking,                    coronary artery disease, prior CVA. Other Factors: S/P CABG x 3, S/P TAVR, CKD, AAA, Afib.  Vascular Interventions: Angiogram and posterior tibial balloon angioplasty of                         RLE (10/26/20). Comparison Study: Previous exam 03/24/21 WNL Performing Technologist: Rogelia Rohrer RVT, RDMS  Examination Guidelines: A complete evaluation includes at minimum, Doppler waveform signals and systolic blood pressure reading at the level of bilateral brachial, anterior tibial, and posterior tibial arteries, when vessel segments are accessible. Bilateral testing is considered an integral part of a complete examination. Photoelectric Plethysmograph (PPG) waveforms and toe systolic pressure readings are included as required and additional duplex testing as needed. Limited examinations for reoccurring indications may be performed as noted.  ABI Findings: +---------+------------------+-----+----------+--------+ Right    Rt Pressure (mmHg)IndexWaveform  Comment   +---------+------------------+-----+----------+--------+ Brachial 167                    triphasic          +---------+------------------+-----+----------+--------+ PTA      103               0.62 monophasic         +---------+------------------+-----+----------+--------+ DP       109               0.65 monophasic         +---------+------------------+-----+----------+--------+ Great Toe75                0.45 Abnormal           +---------+------------------+-----+----------+--------+ +---------+------------------+-----+----------+-------+ Left     Lt Pressure (mmHg)IndexWaveform  Comment +---------+------------------+-----+----------+-------+ Brachial 167                    triphasic         +---------+------------------+-----+----------+-------+ PTA      203               1.22 monophasic        +---------+------------------+-----+----------+-------+ DP       158               0.95 monophasic        +---------+------------------+-----+----------+-------+ Great Toe52                0.31 Abnormal          +---------+------------------+-----+----------+-------+ +-------+-----------+-----------+------------+------------+ ABI/TBIToday's ABIToday's TBIPrevious ABIPrevious TBI +-------+-----------+-----------+------------+------------+ Right  0.65       0.45       1.09        0.74         +-------+-----------+-----------+------------+------------+ Left   1.22       0.31       1.19        0.76         +-------+-----------+-----------+------------+------------+ Bilateral TBIs appear decreased. Right ABIs appear decreased.  Summary: Right: Resting right ankle-brachial index indicates moderate right lower extremity arterial disease. The right toe-brachial index is abnormal. Left: The left toe-brachial index is abnormal. Although ankle brachial indices are within normal limits (0.95-1.29), arterial Doppler waveforms at the ankle suggest some component  of  arterial occlusive disease. *See table(s) above for measurements and observations.  Electronically signed by Jamelle Haring on 11/02/2021 at 4:05:01 PM.    Final    DG Foot Complete Right  Result Date: 11/02/2021 CLINICAL DATA:  Infection.  Second toe. EXAM: RIGHT FOOT COMPLETE - 3+ VIEW COMPARISON:  None Available. FINDINGS: There is a soft tissue abnormality along the tip of the second toe distal phalanx. There is no definite evidence of cortical irregularity on the provided views. No evidence of subcutaneous gas. There are enthesopathic changes at the calcaneus. There are multifocal degenerative changes at the forefoot and hindfoot. Vascular calcifications are present. IMPRESSION: Soft tissue abnormality along the tip of the second toe distal phalanx without definite evidence of cortical irregularity. However, given clinical concerns for osseous extension, osteomyelitis is not excluded. Consider further evaluation with MRI if more definitive characterization is clinically warranted. Electronically Signed   By: Marin Roberts M.D.   On: 11/02/2021 12:41   DG Foot Complete Left  Result Date: 11/02/2021 CLINICAL DATA:  Second toe wound. EXAM: LEFT FOOT - COMPLETE 3+ VIEW COMPARISON:  October 05, 2021. FINDINGS: There is no evidence of fracture or dislocation. There is no evidence of arthropathy or other focal bone abnormality. Soft tissues are unremarkable. IMPRESSION: No acute abnormality seen in the left foot. No definite lytic destruction is seen to suggest osteomyelitis. Electronically Signed   By: Marijo Conception M.D.   On: 11/02/2021 11:54   DG Chest 2 View  Result Date: 11/02/2021 CLINICAL DATA:  Possible sepsis with second toe wound, initial encounter EXAM: CHEST - 2 VIEW COMPARISON:  10/22/2020 FINDINGS: Cardiac shadow is stable. Postsurgical changes are noted as well as prior TAVR. The lungs are well aerated bilaterally. No focal infiltrate or sizable effusion is noted. Changes of prior vertebral  augmentation at L1 are noted. IMPRESSION: No active cardiopulmonary disease. Electronically Signed   By: Inez Catalina M.D.   On: 11/02/2021 11:16      LOS: 2 days    Flora Lipps, MD [facility] Available via Epic secure chat 7am-7pm After these hours, please refer to coverage provider listed on [email or web address] 11/04/2021, 9:06 AM

## 2021-11-04 NOTE — Progress Notes (Signed)
Chaplain visited pt to check in with church member per local pastor. Pt appears tired both physically and emotionally from his decline in the last two years.  Chaplain will f/u with wife [name] who will arrive around 11:30.    Please call if support is needed earlier or at any time.  Minus [place], [place] Pager:  [phone]    11/04/21 1000  Clinical Encounter Type  Visited With Patient  Visit Type Initial;Psychological support  Referral From Other (Comment) (Local church)  Stress Factors  Patient Stress Factors Health changes

## 2021-11-04 NOTE — Progress Notes (Signed)
ANTICOAGULATION CONSULT NOTE - Follow Up Consult  Pharmacy Consult for Heparin Indication: atrial fibrillation  Allergies  Allergen Reactions   Amoxicillin Other (See Comments)    Headache, debilitated   Bee Venom Anaphylaxis   Avelox [Moxifloxacin] Other (See Comments)    Unknown   Codeine Other (See Comments)    Unknown    Duloxetine Other (See Comments)    Unknown   Duloxetine Hcl Other (See Comments)    felt weird   Levitra [Vardenafil] Other (See Comments)    Unknown   Morphine And Related Other (See Comments)    hypotension   Penicillin G Sodium Other (See Comments)    Severe headache, fatigue "debilitated"   Testosterone Other (See Comments)    Unknown    Tizanidine Other (See Comments)    Unknown     Viagra [Sildenafil] Other (See Comments)    didn't like it    Patient Measurements: Height: '6\' 2"'$  (188 cm) Weight: 98 kg (216 lb) IBW/kg (Calculated) : 82.2 Heparin Dosing Weight: 98 kg  Vital Signs: Temp: 97.9 F (36.6 C) (11/04 0729) Temp Source: Oral (11/04 0729) BP: 138/75 (11/04 0729) Pulse Rate: 74 (11/04 0729)  Labs: Recent Labs    11/02/21 1212 11/02/21 1212 11/02/21 1606 11/03/21 0545 11/03/21 1436 11/04/21 0233  HGB 9.2*  --   --  8.5*  --  8.6*  HCT 30.7*  --   --  26.9*  --  27.9*  PLT 149*  --   --  129*  --  129*  APTT  --    < > 32 90* 104* 102*  LABPROT 13.9  --   --   --   --   --   INR 1.1  --   --   --   --   --   HEPARINUNFRC  --   --  >1.10* >1.10*  --  0.92*  CREATININE 1.58*  --   --   --   --  1.35*   < > = values in this interval not displayed.    Estimated Creatinine Clearance: 39.7 mL/min (A) (by C-G formula based on SCr of 1.35 mg/dL (H)).   Medications:  Scheduled:   amiodarone  200 mg Oral Daily   ascorbic acid  500 mg Oral Daily   atorvastatin  40 mg Oral Daily   cholecalciferol  1,000 Units Oral Daily   ezetimibe  10 mg Oral Daily   finasteride  5 mg Oral Daily   gabapentin  200 mg Oral BID   galantamine   8 mg Oral Q breakfast   isosorbide mononitrate  60 mg Oral Daily   memantine  10 mg Oral BID   pantoprazole  40 mg Oral Daily   polyethylene glycol  17 g Oral Daily   potassium chloride  10 mEq Oral Daily   predniSONE  3 mg Oral Q breakfast   psyllium  1 packet Oral Daily   sodium chloride flush  3 mL Intravenous Q12H   tamsulosin  0.4 mg Oral QPC supper   Infusions:   heparin 1,300 Units/hr (11/03/21 1641)    Assessment: 25 yoM with a history of HTN, HLD, CAD s/p CABG, AF s/p maze on apixaban, HF, s/p TAVR, CVA, PVD, AAA, OSA on CPAP. Patient is presenting with wound infection. Last dose of Eliquis 11/2 AM. Pharmacy consulted to dose heparin while determining surgical intervention.   Given recent Eliquis use, will require aPTT monitoring until levels are correlating.  aPTT 102 -  therapeutic, on 1300 units/hr. Heparin level 0.92 - remains elevated, but appears to be trending down. Hgb 8.6, plt 129. No line issues or signs/symptoms of bleeding noted per RN.  Given aPTT at higher end of goal, will reduce rate slightly and re-check levels in ~8 hrs.   Goal of Therapy:  Heparin level 0.3-0.7 units/ml aPTT 66-102 seconds Monitor platelets by anticoagulation protocol: Yes   Plan:  Decrease IV heparin to 1250 units/hr. Check ~8 hr heparin level, aPTT.  Daily heparin level and aPTT, until correlating. Monitor CBC and for signs/symptoms of bleeding.   Vance Peper, PharmD PGY-2 Pharmacy Resident Phone [phone] 11/04/2021 7:44 AM   Please check AMION for all [place] phone numbers After 10:00 PM, call [place] [phone]

## 2021-11-04 NOTE — Progress Notes (Signed)
ANTICOAGULATION CONSULT NOTE - Follow Up Consult  Pharmacy Consult for Heparin Indication: atrial fibrillation  Allergies  Allergen Reactions   Amoxicillin Other (See Comments)    Headache, debilitated   Bee Venom Anaphylaxis   Avelox [Moxifloxacin] Other (See Comments)    Unknown   Codeine Other (See Comments)    Unknown    Duloxetine Other (See Comments)    Unknown   Duloxetine Hcl Other (See Comments)    felt weird   Levitra [Vardenafil] Other (See Comments)    Unknown   Morphine And Related Other (See Comments)    hypotension   Penicillin G Sodium Other (See Comments)    Severe headache, fatigue "debilitated"   Testosterone Other (See Comments)    Unknown    Tizanidine Other (See Comments)    Unknown     Viagra [Sildenafil] Other (See Comments)    didn't like it    Patient Measurements: Height: '6\' 2"'$  (188 cm) Weight: 98 kg (216 lb) IBW/kg (Calculated) : 82.2 Heparin Dosing Weight: 98 kg  Vital Signs: Temp: 97.4 F (36.3 C) (11/04 1545) Temp Source: Oral (11/04 1545) BP: 126/62 (11/04 1545) Pulse Rate: 56 (11/04 1545)  Labs: Recent Labs    11/02/21 1212 11/02/21 1606 11/03/21 0545 11/03/21 1436 11/04/21 0233 11/04/21 1707  HGB 9.2*  --  8.5*  --  8.6*  --   HCT 30.7*  --  26.9*  --  27.9*  --   PLT 149*  --  129*  --  129*  --   APTT  --    < > 90* 104* 102* 70*  LABPROT 13.9  --   --   --   --   --   INR 1.1  --   --   --   --   --   HEPARINUNFRC  --    < > >1.10*  --  0.92* 0.71*  CREATININE 1.58*  --   --   --  1.35*  --    < > = values in this interval not displayed.     Estimated Creatinine Clearance: 39.7 mL/min (A) (by C-G formula based on SCr of 1.35 mg/dL (H)).   Medications:  Scheduled:   amiodarone  200 mg Oral Daily   ascorbic acid  500 mg Oral Daily   atorvastatin  40 mg Oral Daily   cholecalciferol  1,000 Units Oral Daily   vitamin B-12  1,000 mcg Oral Daily   docusate sodium  100 mg Oral BID   ezetimibe  10 mg Oral Daily    finasteride  5 mg Oral Daily   gabapentin  200 mg Oral BID   galantamine  8 mg Oral Q breakfast   isosorbide mononitrate  60 mg Oral Daily   memantine  10 mg Oral BID   metoprolol succinate  25 mg Oral Daily   pantoprazole  40 mg Oral Daily   polyethylene glycol  17 g Oral Daily   potassium chloride  10 mEq Oral Daily   predniSONE  3 mg Oral Q breakfast   psyllium  1 packet Oral Daily   sodium chloride flush  3 mL Intravenous Q12H   tamsulosin  0.4 mg Oral QPC supper   Infusions:   heparin 1,250 Units/hr (11/04/21 1317)    Assessment: 61 yoM with a history of HTN, HLD, CAD s/p CABG, AF s/p maze on apixaban, HF, s/p TAVR, CVA, PVD, AAA, OSA on CPAP. Patient is presenting with wound infection. Last dose of  Eliquis 11/2 AM. Pharmacy consulted to dose heparin while determining surgical intervention.   Given recent Eliquis use, will require aPTT monitoring until levels are correlating.  aPTT 70 - therapeutic, on 1250 units/hr. Heparin level remains elevated, as expected, but trending down. This morningHgb 8.6, plt 129. No line issues or signs/symptoms of bleeding noted per RN.    Goal of Therapy:  Heparin level 0.3-0.7 units/ml aPTT 66-102 seconds Monitor platelets by anticoagulation protocol: Yes   Plan:  Continue IV heparin at 1250 units/hr. Check ~8 hr heparin level, aPTT.  Daily heparin level and aPTT, until correlating. Monitor CBC and for signs/symptoms of bleeding.   Thank you for allowing [place] to participate in this patients care. Jens Som, PharmD 11/04/2021 7:02 PM  **Pharmacist phone directory can be found on [email or web address] listed under [place]**

## 2021-11-05 DIAGNOSIS — L97519 Non-pressure chronic ulcer of other part of right foot with unspecified severity: Secondary | ICD-10-CM | POA: Diagnosis not present

## 2021-11-05 DIAGNOSIS — I5032 Chronic diastolic (congestive) heart failure: Secondary | ICD-10-CM | POA: Diagnosis not present

## 2021-11-05 DIAGNOSIS — I480 Paroxysmal atrial fibrillation: Secondary | ICD-10-CM | POA: Diagnosis not present

## 2021-11-05 DIAGNOSIS — I10 Essential (primary) hypertension: Secondary | ICD-10-CM | POA: Diagnosis not present

## 2021-11-05 LAB — CBC
HCT: 26.4 % — ABNORMAL LOW (ref 39.0–52.0)
Hemoglobin: 8.1 g/dL — ABNORMAL LOW (ref 13.0–17.0)
MCH: 28.4 pg (ref 26.0–34.0)
MCHC: 30.7 g/dL (ref 30.0–36.0)
MCV: 92.6 fL (ref 80.0–100.0)
Platelets: 143 10*3/uL — ABNORMAL LOW (ref 150–400)
RBC: 2.85 MIL/uL — ABNORMAL LOW (ref 4.22–5.81)
RDW: 16 % — ABNORMAL HIGH (ref 11.5–15.5)
WBC: 4.5 10*3/uL (ref 4.0–10.5)
nRBC: 0 % (ref 0.0–0.2)

## 2021-11-05 LAB — APTT
aPTT: 117 seconds — ABNORMAL HIGH (ref 24–36)
aPTT: 94 seconds — ABNORMAL HIGH (ref 24–36)
aPTT: 89 seconds — ABNORMAL HIGH (ref 24–36)

## 2021-11-05 LAB — HEPARIN LEVEL (UNFRACTIONATED)
Heparin Unfractionated: 0.59 IU/mL (ref 0.30–0.70)
Heparin Unfractionated: 0.62 IU/mL (ref 0.30–0.70)
Heparin Unfractionated: 0.62 IU/mL (ref 0.30–0.70)

## 2021-11-05 NOTE — Progress Notes (Signed)
ANTICOAGULATION CONSULT NOTE - Follow Up Consult  Pharmacy Consult for Heparin Indication: atrial fibrillation  Allergies  Allergen Reactions   Amoxicillin Other (See Comments)    Headache, debilitated   Bee Venom Anaphylaxis   Avelox [Moxifloxacin] Other (See Comments)    Unknown   Codeine Other (See Comments)    Unknown    Duloxetine Other (See Comments)    Unknown   Duloxetine Hcl Other (See Comments)    felt weird   Levitra [Vardenafil] Other (See Comments)    Unknown   Morphine And Related Other (See Comments)    hypotension   Penicillin G Sodium Other (See Comments)    Severe headache, fatigue "debilitated"   Testosterone Other (See Comments)    Unknown    Tizanidine Other (See Comments)    Unknown     Viagra [Sildenafil] Other (See Comments)    didn't like it    Patient Measurements: Height: '6\' 2"'$  (188 cm) Weight: 98 kg (216 lb) IBW/kg (Calculated) : 82.2 Heparin Dosing Weight: 98 kg  Vital Signs:    Labs: Recent Labs    11/03/21 0545 11/03/21 1436 11/04/21 0233 11/04/21 1707 11/05/21 0227 11/05/21 0925 11/05/21 1809  HGB 8.5*  --  8.6*  --  8.1*  --   --   HCT 26.9*  --  27.9*  --  26.4*  --   --   PLT 129*  --  129*  --  143*  --   --   APTT 90*   < > 102* 70* 117* 94*  --   HEPARINUNFRC >1.10*  --  0.92* 0.71* 0.59 0.62 0.62  CREATININE  --   --  1.35*  --   --   --   --    < > = values in this interval not displayed.     Estimated Creatinine Clearance: 39.7 mL/min (A) (by C-G formula based on SCr of 1.35 mg/dL (H)).   Medications:  Scheduled:   amiodarone  200 mg Oral Daily   ascorbic acid  500 mg Oral Daily   atorvastatin  40 mg Oral Daily   cholecalciferol  1,000 Units Oral Daily   vitamin B-12  1,000 mcg Oral Daily   docusate sodium  100 mg Oral BID   ezetimibe  10 mg Oral Daily   finasteride  5 mg Oral Daily   gabapentin  200 mg Oral BID   galantamine  8 mg Oral Q breakfast   isosorbide mononitrate  60 mg Oral Daily    memantine  10 mg Oral BID   metoprolol succinate  25 mg Oral Daily   pantoprazole  40 mg Oral Daily   polyethylene glycol  17 g Oral Daily   potassium chloride  10 mEq Oral Daily   predniSONE  3 mg Oral Q breakfast   psyllium  1 packet Oral Daily   sodium chloride flush  3 mL Intravenous Q12H   tamsulosin  0.4 mg Oral QPC supper   Infusions:   heparin 1,250 Units/hr (11/05/21 0604)    Assessment: [name] [name] with a history of HTN, HLD, CAD s/p CABG, AF s/p maze on apixaban, HF, s/p TAVR, CVA, PVD, AAA, OSA on CPAP. Patient is presenting with wound infection. Last dose of Eliquis 11/2 AM. Pharmacy consulted to dose heparin while determining surgical intervention.   Heparin level this evening remains therapeutic at 0.62 on 1250 units/hr. No infusion or bleeding issues noted.  Goal of Therapy:  Heparin level 0.3-0.7 units/ml aPTT 66-102  seconds Monitor platelets by anticoagulation protocol: Yes   Plan:  Continue IV heparin at 1250 units/hr. Daily heparin level Monitor CBC and for signs/symptoms of bleeding.   Thank you for allowing [place] to participate in this patients care. Jens Som, PharmD 11/05/2021 7:22 PM  **Pharmacist phone directory can be found on [email or web address] listed under [place]**

## 2021-11-05 NOTE — Progress Notes (Signed)
PROGRESS NOTE    [name] [name]  GYJ:[id number] DOB: 1926-12-03 DOA: 11/02/2021 PCP: [name], MD    Brief Narrative:   [name] [name] is a 88 years old male with past medical history significant of hypertension, hyperlipidemia, CAD s/p CABG, PAF s/p maze on chronic anticoagulation, systolic CHF, severe AS s/p TAVR, CVA with residual deficit, PVD, PMR, AAA, OSA on CPAP presented to the hospital with worsening of feet wounds.  Patient was seen at the wound care clinic and due to worsening wound of his left second toe he was sent to the hospital for further evaluation.  Patient did have history of stroke in May 2023 and has been having difficult time  with ADLs.  In the ED, patient was bradycardic and mildly hypotensive.  WBC was 5.1.  Creatinine 1.5.  X-ray of the left foot did not show any osseous abnormality.  Chest x-ray was negative for acute findings. Dr. Stann Mainland of orthopedics was consulted and notified Dr. Sharol Given of the patient being admitted to the hospital.  Vascular surgery was also consulted and patient was admitted hospital for further evaluation and treatment.    Assessment and Plan:  Principal Problem:   Skin ulcers of foot, bilateral (HCC) Active Problems:   Paroxysmal atrial fibrillation (HCC)   Essential hypertension   Chronic diastolic CHF (congestive heart failure) (HCC)   CAD (coronary artery disease), native coronary artery   PAD (peripheral artery disease) (HCC)   History of CVA with residual deficit   CKD (chronic kidney disease) stage 3, GFR 30-59 ml/min (HCC)   Polymyalgia rheumatica (HCC)   Dementia (HCC)   OSA (obstructive sleep apnea)   Skin ulcers of feet, bilateral, right heel pressure ulceration. Acute on chronic  ischemic ulcers..  Presenting with worsening ulceration on the left.,  Significant pressure ulcer on the right heel.  Changed was followed by wound care as outpatient.  X-ray of the foot without signs of osteomyelitis follow-up ABI.    ESR elevated at  103, CRP 5.0.  Continue to hold off with antibiotic at this time.Vascular surgery has seen the patient at this time and patient would like to proceed with aggressive treatment.  Plan is bilateral lower extremity angiography on 11/06/2021.    PAF on chronic anticoagulation Continue heparin drip at this time.  On Eliquis as outpatient, holding for now for potential procedure.  Essential hypertension Continue metoprolol.   Diastolic congestive heart failure Review of 2D echocardiogram from 05/2021 with left ventricle ejection fraction of  65 -70%.  Appears to be compensated at this time.  Continue metoprolol.  Hold torsemide.  CAD S/p CABG -Continue Imdur and statin   PAD Status post angioplasty of the right posterior tibial artery 10/2020.  Continue Lipitor.  Vascular surgery has been consulted, plan for ABI.   History of CVA with residual deficit Patient suffered left middle cerebral artery stroke in 05/2021 with residual right hand weakness and difficulty speaking.  Continue supportive care.  Continue statins.   CKD stage IIIb Creatinine 1.3 which appears near patient's baseline.  We will continue to monitor BMP while in the hospital.   History of rheumatoid arthritis/PMR Needing assistance with ADLs.  We will get PT evaluation.  Wheelchair-bound currently.  On leflunomide and prednisone.  We will continue to hold leflunomide.  Continue prednisone.  Dementia Continue Namenda and galantamine    OSA -Continue CPAP at night     DVT prophylaxis: Heparin drip  Code Status:      Code Status:  DNR  Disposition: Home  Status is: Inpatient  Remains inpatient appropriate because: Ischemic foot ulcers, vascular surgery evaluation, plan for angiogram 11/06/2021   Family Communication:  I spoke with the patient's spouse and updated her about the clinical condition of the patient on 11/04/2021.  Consultants:  Vascular surgery,  Orthopedics  Procedures:  None  Antimicrobials:  None    Anti-infectives (From admission, onward)    None      Subjective:  Today, patient was seen and examined at bedside.  Complains of mild foot discomfort but no fever chills or rigor.  No nausea vomiting or diarrhea.  Objective: Vitals:   11/04/21 0729 11/04/21 1545 11/04/21 2128 11/05/21 0458  BP: 138/75 126/62 124/66 136/69  Pulse: 74 (!) 56 62 60  Resp: _0 Temp: 97.9 F (36.6 C) (!) 97.4 F (36.3 C) 98.1 F (36.7 C) 98.2 F (36.8 C)  TempSrc: Oral Oral Oral Oral  SpO2: 96% 95% 97% 96%  Weight:      Height:        Intake/Output Summary (Last 24 hours) at 11/05/2021 1416 Last data filed at 11/05/2021 0459 Gross per 24 hour  Intake --  Output 1410 ml  Net -1410 ml    Filed Weights   11/02/21 1045  Weight: 98 kg    Physical Examination: Body mass index is 27.73 kg/m.   General: Awake and Communicative, appears pale.,  Elderly male HENT:   No scleral pallor or icterus noted. Oral mucosa is moist.  Chest:  Clear breath sounds.  Diminished breath sounds bilaterally. No crackles or wheezes.  CVS: S1 &S2 heard. No murmur.  Regular rate and rhythm. Abdomen: Soft, nontender, nondistended.  Bowel sounds are heard.   Extremities: No cyanosis, clubbing or edema.  Peripheral pulses are palpable.  Bilateral lower extremity ulcers with dressing.  Unable to palpate dorsalis pulses. Psych: Alert, awake and oriented, normal mood CNS:  No cranial nerve deficits.  Power equal in all extremities.   Skin: Warm and dry.  Bilateral lower extremity wounds with dressing in place.  Data Reviewed:   CBC: Recent Labs  Lab 11/02/21 1212 11/03/21 0545 11/04/21 0233 11/05/21 0227  WBC 5.1 5.5 4.9 4.5  NEUTROABS 3.3  --   --   --   HGB 9.2* 8.5* 8.6* 8.1*  HCT 30.7* 26.9* 27.9* 26.4*  MCV 94.5 91.8 93.3 92.6  PLT 149* 129* 129* 143*     Basic Metabolic Panel: Recent Labs  Lab 11/02/21 1212 11/04/21 0233  NA 140 137  K 3.7 3.9  CL 102 106  CO2 28 24  GLUCOSE  98 107*  BUN 28* 19  CREATININE 1.58* 1.35*  CALCIUM 10.2 9.6  MG  --  2.2     Liver Function Tests: Recent Labs  Lab 11/02/21 1212  AST 21  ALT 17  ALKPHOS 114  BILITOT 0.4  PROT 7.0  ALBUMIN 3.1*      Radiology Studies: No results found.    LOS: 3 days    Flora Lipps, MD [facility] Available via Epic secure chat 7am-7pm After these hours, please refer to coverage provider listed on [email or web address] 11/05/2021, 2:16 PM

## 2021-11-05 NOTE — Progress Notes (Signed)
ANTICOAGULATION CONSULT NOTE - Follow Up Consult  Pharmacy Consult for Heparin Indication: atrial fibrillation  Allergies  Allergen Reactions   Amoxicillin Other (See Comments)    Headache, debilitated   Bee Venom Anaphylaxis   Avelox [Moxifloxacin] Other (See Comments)    Unknown   Codeine Other (See Comments)    Unknown    Duloxetine Other (See Comments)    Unknown   Duloxetine Hcl Other (See Comments)    felt weird   Levitra [Vardenafil] Other (See Comments)    Unknown   Morphine And Related Other (See Comments)    hypotension   Penicillin G Sodium Other (See Comments)    Severe headache, fatigue "debilitated"   Testosterone Other (See Comments)    Unknown    Tizanidine Other (See Comments)    Unknown     Viagra [Sildenafil] Other (See Comments)    didn't like it    Patient Measurements: Height: '6\' 2"'$  (188 cm) Weight: 98 kg (216 lb) IBW/kg (Calculated) : 82.2 Heparin Dosing Weight: 98 kg  Vital Signs: Temp: 98.2 F (36.8 C) (11/05 0458) Temp Source: Oral (11/05 0458) BP: 136/69 (11/05 0458) Pulse Rate: 60 (11/05 0458)  Labs: Recent Labs    11/02/21 1212 11/02/21 1606 11/03/21 0545 11/03/21 1436 11/04/21 0233 11/04/21 1707 11/05/21 0227 11/05/21 0925  HGB 9.2*  --  8.5*  --  8.6*  --  8.1*  --   HCT 30.7*  --  26.9*  --  27.9*  --  26.4*  --   PLT 149*  --  129*  --  129*  --  143*  --   APTT  --    < > 90*   < > 102* 70* 117* 94*  LABPROT 13.9  --   --   --   --   --   --   --   INR 1.1  --   --   --   --   --   --   --   HEPARINUNFRC  --    < > >1.10*  --  0.92* 0.71* 0.59 0.62  CREATININE 1.58*  --   --   --  1.35*  --   --   --    < > = values in this interval not displayed.    Estimated Creatinine Clearance: 39.7 mL/min (A) (by C-G formula based on SCr of 1.35 mg/dL (H)).   Medications:  Scheduled:   amiodarone  200 mg Oral Daily   ascorbic acid  500 mg Oral Daily   atorvastatin  40 mg Oral Daily   cholecalciferol  1,000 Units Oral  Daily   vitamin B-12  1,000 mcg Oral Daily   docusate sodium  100 mg Oral BID   ezetimibe  10 mg Oral Daily   finasteride  5 mg Oral Daily   gabapentin  200 mg Oral BID   galantamine  8 mg Oral Q breakfast   isosorbide mononitrate  60 mg Oral Daily   memantine  10 mg Oral BID   metoprolol succinate  25 mg Oral Daily   pantoprazole  40 mg Oral Daily   polyethylene glycol  17 g Oral Daily   potassium chloride  10 mEq Oral Daily   predniSONE  3 mg Oral Q breakfast   psyllium  1 packet Oral Daily   sodium chloride flush  3 mL Intravenous Q12H   tamsulosin  0.4 mg Oral QPC supper   Infusions:   heparin 1,250 Units/hr (  11/05/21 0604)    Assessment: 68 yoM with a history of HTN, HLD, CAD s/p CABG, AF s/p maze on apixaban, HF, s/p TAVR, CVA, PVD, AAA, OSA on CPAP. Patient is presenting with wound infection. Last dose of Eliquis 11/2 AM. Pharmacy consulted to dose heparin while determining surgical intervention.   Given recent Eliquis use, will require aPTT monitoring until levels are correlating.  aPTT 94 - therapeutic, on 1250 units/hr. Heparin level 0.62 - therapeutic. Hgb 8.1, plt 143. No line issues or signs/symptoms of bleeding noted per RN.  aPTT and heparin level appear to be correlating - will re-check levels in ~8 hrs to confirm.   Goal of Therapy:  Heparin level 0.3-0.7 units/ml aPTT 66-102 seconds Monitor platelets by anticoagulation protocol: Yes   Plan:  Continue IV heparin at 1250 units/hr. Check ~8 hr heparin level, aPTT.  Daily heparin level and aPTT, until correlating. Monitor CBC and for signs/symptoms of bleeding.   Vance Peper, PharmD PGY-2 Pharmacy Resident Phone [phone] 11/05/2021 10:34 AM   Please check AMION for all [place] phone numbers After 10:00 PM, call [place] [phone]

## 2021-11-06 ENCOUNTER — Encounter (HOSPITAL_COMMUNITY)
Admission: EM | Disposition: A | Discharge status: Skilled Nursing Facility | Payer: Self-pay | Source: Ambulatory Visit | Attending: Internal Medicine

## 2021-11-06 DIAGNOSIS — I5032 Chronic diastolic (congestive) heart failure: Secondary | ICD-10-CM | POA: Diagnosis not present

## 2021-11-06 DIAGNOSIS — I70245 Atherosclerosis of native arteries of left leg with ulceration of other part of foot: Secondary | ICD-10-CM

## 2021-11-06 DIAGNOSIS — L97519 Non-pressure chronic ulcer of other part of right foot with unspecified severity: Secondary | ICD-10-CM | POA: Diagnosis not present

## 2021-11-06 DIAGNOSIS — I10 Essential (primary) hypertension: Secondary | ICD-10-CM | POA: Diagnosis not present

## 2021-11-06 DIAGNOSIS — I480 Paroxysmal atrial fibrillation: Secondary | ICD-10-CM | POA: Diagnosis not present

## 2021-11-06 HISTORY — PX: ABDOMINAL AORTOGRAM W/LOWER EXTREMITY: CATH118223

## 2021-11-06 HISTORY — PX: PERIPHERAL VASCULAR INTERVENTION: CATH118257

## 2021-11-06 LAB — CBC
HCT: 31.2 % — ABNORMAL LOW (ref 39.0–52.0)
Hemoglobin: 9.2 g/dL — ABNORMAL LOW (ref 13.0–17.0)
MCH: 28.3 pg (ref 26.0–34.0)
MCHC: 29.5 g/dL — ABNORMAL LOW (ref 30.0–36.0)
MCV: 96 fL (ref 80.0–100.0)
Platelets: 145 10*3/uL — ABNORMAL LOW (ref 150–400)
RBC: 3.25 MIL/uL — ABNORMAL LOW (ref 4.22–5.81)
RDW: 15.8 % — ABNORMAL HIGH (ref 11.5–15.5)
WBC: 5.2 10*3/uL (ref 4.0–10.5)
nRBC: 0 % (ref 0.0–0.2)

## 2021-11-06 LAB — BASIC METABOLIC PANEL
Anion gap: 10 (ref 5–15)
BUN: 16 mg/dL (ref 8–23)
CO2: 20 mmol/L — ABNORMAL LOW (ref 22–32)
Calcium: 9.6 mg/dL (ref 8.9–10.3)
Chloride: 107 mmol/L (ref 98–111)
Creatinine, Ser: 1.35 mg/dL — ABNORMAL HIGH (ref 0.61–1.24)
GFR, Estimated: 49 mL/min — ABNORMAL LOW (ref >60)
Glucose, Bld: 100 mg/dL — ABNORMAL HIGH (ref 70–99)
Potassium: 4.2 mmol/L (ref 3.5–5.1)
Sodium: 137 mmol/L (ref 135–145)

## 2021-11-06 LAB — HEPARIN LEVEL (UNFRACTIONATED): Heparin Unfractionated: 0.54 IU/mL (ref 0.30–0.70)

## 2021-11-06 SURGERY — ABDOMINAL AORTOGRAM W/LOWER EXTREMITY
Anesthesia: LOCAL

## 2021-11-06 MED ORDER — HEPARIN (PORCINE) 25000 UT/250ML-% IV SOLN
1250.0000 [IU]/h | INTRAVENOUS | Status: DC
  Administered 2021-11-06 – 2021-11-07 (×2): 1250 [IU]/h via INTRAVENOUS
  Filled 2021-11-06 (×2): qty 250

## 2021-11-06 MED ORDER — ONDANSETRON HCL 4 MG/2ML IJ SOLN: 4.0000 mg | Freq: Four times a day (QID) | INTRAMUSCULAR | Status: DC | PRN

## 2021-11-06 MED ORDER — FENTANYL CITRATE (PF) 100 MCG/2ML IJ SOLN
INTRAMUSCULAR | Status: AC
  Filled 2021-11-06: qty 2

## 2021-11-06 MED ORDER — MIDAZOLAM HCL 2 MG/2ML IJ SOLN
INTRAMUSCULAR | Status: AC
  Filled 2021-11-06: qty 2

## 2021-11-06 MED ORDER — FENTANYL CITRATE (PF) 100 MCG/2ML IJ SOLN
INTRAMUSCULAR | Status: DC | PRN
  Administered 2021-11-06 (×2): 50 ug via INTRAVENOUS

## 2021-11-06 MED ORDER — SODIUM CHLORIDE 0.9% FLUSH
3.0000 mL | Freq: Two times a day (BID) | INTRAVENOUS | Status: DC
  Administered 2021-11-07 – 2021-11-14 (×14): 3 mL via INTRAVENOUS

## 2021-11-06 MED ORDER — SODIUM CHLORIDE 0.9 % IV SOLN: INTRAVENOUS | Status: DC

## 2021-11-06 MED ORDER — HEPARIN SODIUM (PORCINE) 1000 UNIT/ML IJ SOLN
INTRAMUSCULAR | Status: DC | PRN
  Administered 2021-11-06: 10000 [IU] via INTRAVENOUS

## 2021-11-06 MED ORDER — ASPIRIN 81 MG PO CHEW
CHEWABLE_TABLET | ORAL | Status: AC
  Filled 2021-11-06: qty 1

## 2021-11-06 MED ORDER — HEPARIN (PORCINE) IN NACL 1000-0.9 UT/500ML-% IV SOLN
INTRAVENOUS | Status: AC
  Filled 2021-11-06: qty 1000

## 2021-11-06 MED ORDER — PROTAMINE SULFATE 10 MG/ML IV SOLN
INTRAVENOUS | Status: DC | PRN
  Administered 2021-11-06: 5 mg via INTRAVENOUS
  Administered 2021-11-06: 40 mg via INTRAVENOUS

## 2021-11-06 MED ORDER — SODIUM CHLORIDE 0.9 % IV SOLN: 250.0000 mL | INTRAVENOUS | Status: DC | PRN

## 2021-11-06 MED ORDER — LIDOCAINE HCL (PF) 1 % IJ SOLN
INTRAMUSCULAR | Status: AC
  Filled 2021-11-06: qty 30

## 2021-11-06 MED ORDER — PROTAMINE SULFATE 10 MG/ML IV SOLN
INTRAVENOUS | Status: AC
  Filled 2021-11-06: qty 5

## 2021-11-06 MED ORDER — MIDAZOLAM HCL 2 MG/2ML IJ SOLN
INTRAMUSCULAR | Status: DC | PRN
  Administered 2021-11-06: .5 mg via INTRAVENOUS

## 2021-11-06 MED ORDER — IODIXANOL 320 MG/ML IV SOLN
INTRAVENOUS | Status: DC | PRN
  Administered 2021-11-06: 65 mL

## 2021-11-06 MED ORDER — ASPIRIN 81 MG PO TBEC
81.0000 mg | DELAYED_RELEASE_TABLET | Freq: Every day | ORAL | Status: DC
  Administered 2021-11-06 – 2021-11-14 (×7): 81 mg via ORAL
  Filled 2021-11-06 (×8): qty 1

## 2021-11-06 MED ORDER — HYDRALAZINE HCL 20 MG/ML IJ SOLN
5.0000 mg | INTRAMUSCULAR | Status: AC | PRN
  Administered 2021-11-06 (×2): 5 mg via INTRAVENOUS

## 2021-11-06 MED ORDER — HYDRALAZINE HCL 20 MG/ML IJ SOLN
INTRAMUSCULAR | Status: AC
  Filled 2021-11-06: qty 1

## 2021-11-06 MED ORDER — HEPARIN (PORCINE) IN NACL 1000-0.9 UT/500ML-% IV SOLN
INTRAVENOUS | Status: DC | PRN
  Administered 2021-11-06 (×2): 500 mL

## 2021-11-06 MED ORDER — SODIUM CHLORIDE 0.9% FLUSH: 3.0000 mL | INTRAVENOUS | Status: DC | PRN

## 2021-11-06 MED ORDER — LABETALOL HCL 5 MG/ML IV SOLN: 10.0000 mg | INTRAVENOUS | Status: DC | PRN

## 2021-11-06 MED ORDER — LIDOCAINE HCL (PF) 1 % IJ SOLN
INTRAMUSCULAR | Status: DC | PRN
  Administered 2021-11-06: 12 mL

## 2021-11-06 MED ORDER — SODIUM CHLORIDE 0.9 % WEIGHT BASED INFUSION
1.0000 mL/kg/h | INTRAVENOUS | Status: AC
  Administered 2021-11-06: 1 mL/kg/h via INTRAVENOUS

## 2021-11-06 MED ORDER — ACETAMINOPHEN 325 MG PO TABS
650.0000 mg | ORAL_TABLET | ORAL | Status: DC | PRN
  Administered 2021-11-09 – 2021-11-11 (×2): 650 mg via ORAL
  Filled 2021-11-06 (×3): qty 2

## 2021-11-06 SURGICAL SUPPLY — 19 items
BALLN MUSTANG 6.0X40 135 (BALLOONS) ×2
BALLOON MUSTANG 6.0X40 135 (BALLOONS) IMPLANT
CATH OMNI FLUSH 5F 65CM (CATHETERS) IMPLANT
CLOSURE PERCLOSE PROSTYLE (VASCULAR PRODUCTS) IMPLANT
GLIDEWIRE ADV .035X260CM (WIRE) IMPLANT
KIT ENCORE 26 ADVANTAGE (KITS) IMPLANT
KIT MICROPUNCTURE NIT STIFF (SHEATH) IMPLANT
KIT PV (KITS) ×3 IMPLANT
SHEATH PINNACLE 5F 10CM (SHEATH) IMPLANT
SHEATH PINNACLE MP 6F 45CM (SHEATH) IMPLANT
SHEATH PROBE COVER 6X72 (BAG) IMPLANT
STENT ELUVIA 7X40X130 (Permanent Stent) IMPLANT
STOPCOCK MORSE 400PSI 3WAY (MISCELLANEOUS) IMPLANT
SYR MEDRAD MARK V 150ML (SYRINGE) IMPLANT
TRANSDUCER W/STOPCOCK (MISCELLANEOUS) ×3 IMPLANT
TRAY PV CATH (CUSTOM PROCEDURE TRAY) ×3 IMPLANT
TUBING CIL FLEX 10 FLL-RA (TUBING) IMPLANT
WIRE BENTSON .035X145CM (WIRE) IMPLANT
WIRE ROSEN-J .035X180CM (WIRE) IMPLANT

## 2021-11-06 NOTE — Progress Notes (Signed)
Patient arrived to the unit from the cath lab, placed on monitor, CCMD notified, VSS.  Patient given pain medicine.  Will continue to monitor.

## 2021-11-06 NOTE — Progress Notes (Signed)
PROGRESS NOTE    [name] [name]  UDJ:[id number] DOB: May 24, 1926 DOA: 11/02/2021 PCP: [name], MD    Brief Narrative:   [name] [name] is a 88 years old male with past medical history significant of hypertension, hyperlipidemia, CAD s/p CABG, PAF s/p maze on chronic anticoagulation, systolic CHF, severe AS s/p TAVR, CVA with residual deficit, PVD, PMR, AAA, OSA on CPAP presented to the hospital with worsening of feet wounds.  Patient was seen at the wound care clinic and due to worsening wound of his left second toe he was sent to the hospital for further evaluation.  Patient did have history of stroke in May 2023 and has been having difficult time  with ADLs.  In the ED, patient was bradycardic and mildly hypotensive.  WBC was 5.1.  Creatinine 1.5.  X-ray of the left foot did not show any osseous abnormality.  Chest x-ray was negative for acute findings. Dr. Stann Mainland of orthopedics was consulted and notified Dr. Sharol Given of the patient being admitted to the hospital.  Vascular surgery was also consulted and patient was admitted hospital for further evaluation and treatment.    Assessment and Plan:  Principal Problem:   Skin ulcers of foot, bilateral (HCC) Active Problems:   Paroxysmal atrial fibrillation (HCC)   Essential hypertension   Chronic diastolic CHF (congestive heart failure) (HCC)   CAD (coronary artery disease), native coronary artery   PAD (peripheral artery disease) (HCC)   History of CVA with residual deficit   CKD (chronic kidney disease) stage 3, GFR 30-59 ml/min (HCC)   Polymyalgia rheumatica (HCC)   Dementia (HCC)   OSA (obstructive sleep apnea)   Skin ulcers of feet, bilateral, right heel pressure ulceration. Acute on chronic  ischemic ulcers..  Presenting with worsening ulceration on the left.,  Significant pressure ulcer on the right heel.  Changed was followed by wound care as outpatient.  X-ray of the foot without signs of osteomyelitis follow-up ABI.    ESR elevated at  103, CRP 5.0.  Continue to hold off with antibiotic at this time.Vascular surgery has seen the patient at this time and underwent bilateral lower extremity angiography on 11/06/2021.  No vascular intervention was done at this time.  Vascular surgery recommended high-dose statins and aspirin and resumption of anticoagulation 8 hours postprocedure.  Spoke with Dr Sharol Given orthopedics for opinion on further management.  Dr Sharol Given to follow the patient.  PAF on chronic anticoagulation Continue heparin drip at this time.  On Eliquis as outpatient, holding off for the procedure.  Can be resumed after 8 hours of surgery.  Essential hypertension Continue metoprolol.   Diastolic congestive heart failure Review of 2D echocardiogram from 05/2021 with left ventricle ejection fraction of  65 -70%.  Appears to be compensated at this time.  Continue metoprolol.  Torsemide still on hold.  CAD S/p CABG -Continue Imdur and statin   PAD Status post angioplasty of the right posterior tibial artery 10/2020.  Continue Lipitor.  Vascular surgery has seen the patient and patient has undergone bilateral lower extremity angiogram with left femoropopliteal angioplasty and stenting.Marland Kitchen   History of CVA with residual deficit Patient suffered left middle cerebral artery stroke in 05/2021 with residual right hand weakness and difficulty speaking.  Continue supportive care.  Continue statins.   CKD stage IIIb Creatinine 1.3 which appears near patient's baseline.  We will continue to monitor BMP while in the hospital.   History of rheumatoid arthritis/PMR Needing assistance with ADLs.  We will get  PT evaluation.  Wheelchair-bound currently.  On leflunomide and prednisone.  We will continue to hold leflunomide.  Continue prednisone.  Dementia Continue Namenda and galantamine    OSA -Continue CPAP at night     DVT prophylaxis: Heparin drip  Code Status:      Code Status: DNR  Disposition: Home depending upon orthopedic  input on surgical intervention versus conservative treatment.  Status is: Inpatient  Remains inpatient appropriate because: Ischemic foot ulcers, status post angiogram 11/06/2021, orthopedics Dr. Sharol Given evaluation.   Family Communication:  I spoke with the patient's spouse and updated her about the clinical condition of the patient on 11/04/2021.  Consultants:  Vascular surgery,  Orthopedics  Procedures:  None  Antimicrobials:  None   Anti-infectives (From admission, onward)    None      Subjective:  Today, patient was seen and examined at bedside after angiogram.  Patient is spouse at bedside.  Patient denies any overt pain, nausea, vomiting, fever, chills.   Objective: Vitals:   11/05/21 0458 11/05/21 2128 11/06/21 0454 11/06/21 0752  BP: 136/69 (!) 158/70 (!) 138/59   Pulse: 60 60 61   Resp: _0 Temp: 98.2 F (36.8 C) 97.6 F (36.4 C) 97.6 F (36.4 C)   TempSrc: Oral Oral Axillary   SpO2: 96% 98% 98% 100%  Weight:      Height:        Intake/Output Summary (Last 24 hours) at 11/06/2021 0853 Last data filed at 11/06/2021 0655 Gross per 24 hour  Intake --  Output 1900 ml  Net -1900 ml    Filed Weights   11/02/21 1045  Weight: 98 kg    Physical Examination: Body mass index is 27.73 kg/m.   General:  Average built, not in obvious distress, alert awake and Communicative. HENT:   No scleral pallor or icterus noted. Oral mucosa is moist.  Chest:  Clear breath sounds.  Diminished breath sounds bilaterally. No crackles or wheezes.  CVS: S1 &S2 heard. No murmur.  Regular rate and rhythm. Abdomen: Soft, nontender, nondistended.  Bowel sounds are heard.   Extremities: Bilateral lower extremity with ulceration and dressing.  Pictures from presentation below.  Right groin area with dressing from puncture site. Psych: Alert, awake and oriented, normal mood CNS:  No cranial nerve deficits.  Power equal in all extremities.   Skin: Warm and dry. See pics  below Photos from 11/2  Right heel                right foot   Left foot    Data Reviewed:   CBC: Recent Labs  Lab 11/02/21 1212 11/03/21 0545 11/04/21 0233 11/05/21 0227 11/06/21 0242  WBC 5.1 5.5 4.9 4.5 5.2  NEUTROABS 3.3  --   --   --   --   HGB 9.2* 8.5* 8.6* 8.1* 9.2*  HCT 30.7* 26.9* 27.9* 26.4* 31.2*  MCV 94.5 91.8 93.3 92.6 96.0  PLT 149* 129* 129* 143* 145*     Basic Metabolic Panel: Recent Labs  Lab 11/02/21 1212 11/04/21 0233 11/06/21 0242  NA 140 137 137  K 3.7 3.9 4.2  CL 102 106 107  CO2 28 24 20*  GLUCOSE 98 107* 100*  BUN 28* 19 16  CREATININE 1.58* 1.35* 1.35*  CALCIUM 10.2 9.6 9.6  MG  --  2.2  --      Liver Function Tests: Recent Labs  Lab 11/02/21 1212  AST 21  ALT 17  ALKPHOS 114  BILITOT 0.4  PROT 7.0  ALBUMIN 3.1*      Radiology Studies: PERIPHERAL VASCULAR CATHETERIZATION  Result Date: 11/06/2021 Table formatting from the original result was not included. Images from the original result were not included. DATE OF SERVICE: 11/06/2021  PATIENT:  [name]  88 y.o. male  PRE-OPERATIVE DIAGNOSIS:  Atherosclerosis of native arteries of left lower extremity causing ulceration  POST-OPERATIVE DIAGNOSIS:  Same  PROCEDURE:  1) Ultrasound guided right common femoral artery access 2) Aortogram 3) Left lower extremity angiogram with third order cannulation (35m total contrast) 4) Left femoropopliteal angioplasty and stenting (7x428mEluvia) 5) Conscious sedation (45 minutes)  SURGEON:  ThYevonne AlineHaStanford BreedMD  ASSISTANT: none  ANESTHESIA:   local and IV sedation  ESTIMATED BLOOD LOSS: minimal  LOCAL MEDICATIONS USED:  LIDOCAINE  COUNTS: confirmed correct.  PATIENT DISPOSITION:  PACU - hemodynamically stable.  Delay start of Pharmacological VTE agent (>24hrs) due to surgical blood loss or risk of bleeding: no  INDICATION FOR PROCEDURE: DaJAMARR TREINENs a [age].o. male with left second toe ulceration and non-invasive evidence of peripheral  arterial disease. After careful discussion of risks, benefits, and alternatives the patient was offered angiography. The patient understood and wished to proceed.  OPERATIVE FINDINGS: Terminal aorta and iliac arteries: Small juxtarenal aneurysm Tortuous Widely patent  Left lower extremity: Common femoral artery: diffuse disease, but patent. No stenosis >50%. Profunda femoris artery: diffuse disease, but patent. Superficial femoral artery: diffuse disease, but patent. >80% stenosis at Hunter's canal. Popliteal artery: >80% stenosis at Hunter's canal. Patent beyond this lesion without stenosis. Anterior tibial artery: occludes several centimeters beyond its origin Tibioperoneal trunk: diffuse disease, but patent. Peroneal artery: small, tapers to occlusion in mid calf Posterior tibial artery: dominant. Widely patent. Fills the foot. Pedal circulation: fills via PT. Less disease than anticipated.  GLASS score. FP 2. IP 0. Stage 1 disease.  WIfI score calculated based on clinical exam and non-invasive measurements. 2 / 1 / 1. Stage 3.  DESCRIPTION OF PROCEDURE: After identification of the patient in the pre-operative holding area, the patient was transferred to the operating room. The patient was positioned supine on the operating room table. Anesthesia was induced. The groins was prepped and draped in standard fashion. A surgical pause was performed confirming correct patient, procedure, and operative location.  The right groin was anesthetized with subcutaneous injection of 1% lidocaine. Using ultrasound guidance, the right common femoral artery was accessed with micropuncture technique. Fluoroscopy was used to confirm cannulation over the femoral head. The 78F sheath was upsized to 13F.  A Benson wire was advanced into the distal aorta. Over the wire an omni flush catheter was advanced to the level of L2. Aortogram was performed - see above for details.  The left common iliac artery was selected with an omniflush  catheter and Benson guidewire. The wire was advanced into the common femoral artery. Over the wire the omni flush catheter was advanced into the external iliac artery. Selective angiography was performed - see above for details.  The decision was made to intervene. The patient was heparinized with 10,000 units of heparin. The 13F sheath was exchanged for a 80F x 45cm sheath. Selective angiography of the left lower extremity was performed prior to intervention.  The lesions were treated with: Left femoropopliteal angioplasty and stenting (7x4043mluvia)  Completion angiography revealed: Resolution of L SFA / AKPA stenosis at Hunter's canal.  A perclose device was used to close the arteriotomy. Hemostasis was  excellent upon completion.  Conscious sedation was administered with the use of IV fentanyl and midazolam under continuous physician and nurse monitoring.  Heart rate, blood pressure, and oxygen saturation were continuously monitored.  Total sedation time was 45 minutes  Upon completion of the case instrument and sharps counts were confirmed correct. The patient was transferred to the PACU in good condition. I was present for all portions of the procedure.  PLAN: ASA 30m PO QD. High intensity statin therapy. OK to resume anticoagulation 8 hours post procedure. No need for DAPT if patient will be anticoagulated with DOAC going forward. Optimized from a vascular perspective. Can proceed with toe amputation at any time.  TYevonne Aline HStanford Breed MD [facility]Phone Number: [phone]/06/2021 8:39 AM       LOS: 4 days    LFlora Lipps MD [facility] Available via Epic secure chat 7am-7pm After these hours, please refer to coverage provider listed on [email or web address] 11/06/2021, 8:53 AM

## 2021-11-06 NOTE — Progress Notes (Signed)
Patient has home CPAP. Able to place self on/off as needed, No assistance needed from RT at this time.

## 2021-11-06 NOTE — Op Note (Signed)
DATE OF SERVICE: 11/06/2021  PATIENT:  [name] [name]  88 y.o. male  PRE-OPERATIVE DIAGNOSIS:  Atherosclerosis of native arteries of left lower extremity causing ulceration  POST-OPERATIVE DIAGNOSIS:  Same  PROCEDURE:   1) Ultrasound guided right common femoral artery access 2) Aortogram 3) Left lower extremity angiogram with third order cannulation (47m total contrast) 4) Left femoropopliteal angioplasty and stenting (7x49mEluvia) 5) Conscious sedation (45 minutes)  SURGEON:  ThYevonne AlineHaStanford BreedMD  ASSISTANT: none  ANESTHESIA:   local and IV sedation  ESTIMATED BLOOD LOSS: minimal  LOCAL MEDICATIONS USED:  LIDOCAINE   COUNTS: confirmed correct.  PATIENT DISPOSITION:  PACU - hemodynamically stable.   Delay start of Pharmacological VTE agent (>24hrs) due to surgical blood loss or risk of bleeding: no  INDICATION FOR PROCEDURE: [name] [name] a [age].o. male with left second toe ulceration and non-invasive evidence of peripheral arterial disease. After careful discussion of risks, benefits, and alternatives the patient was offered angiography. The patient understood and wished to proceed.  OPERATIVE FINDINGS:  Terminal aorta and iliac arteries: Small juxtarenal aneurysm Tortuous Widely patent  Left lower extremity: Common femoral artery: diffuse disease, but patent. No stenosis >50%.  Profunda femoris artery: diffuse disease, but patent.  Superficial femoral artery: diffuse disease, but patent. >80% stenosis at Hunter's canal. Popliteal artery: >80% stenosis at Hunter's canal. Patent beyond this lesion without stenosis. Anterior tibial artery: occludes several centimeters beyond its origin Tibioperoneal trunk: diffuse disease, but patent. Peroneal artery: small, tapers to occlusion in mid calf Posterior tibial artery: dominant. Widely patent. Fills the foot. Pedal circulation: fills via PT. Less disease than anticipated.  GLASS score. FP 2. IP 0. Stage 1  disease.  WIfI score calculated based on clinical exam and non-invasive measurements. 2 / 1 / 1. Stage 3.   DESCRIPTION OF PROCEDURE: After identification of the patient in the pre-operative holding area, the patient was transferred to the operating room. The patient was positioned supine on the operating room table. Anesthesia was induced. The groins was prepped and draped in standard fashion. A surgical pause was performed confirming correct patient, procedure, and operative location.  The right groin was anesthetized with subcutaneous injection of 1% lidocaine. Using ultrasound guidance, the right common femoral artery was accessed with micropuncture technique. Fluoroscopy was used to confirm cannulation over the femoral head. The 59F sheath was upsized to 18F.   A Benson wire was advanced into the distal aorta. Over the wire an omni flush catheter was advanced to the level of L2. Aortogram was performed - see above for details.   The left common iliac artery was selected with an omniflush catheter and Benson guidewire. The wire was advanced into the common femoral artery. Over the wire the omni flush catheter was advanced into the external iliac artery. Selective angiography was performed - see above for details.   The decision was made to intervene. The patient was heparinized with 10,000 units of heparin. The 18F sheath was exchanged for a 106F x 45cm sheath. Selective angiography of the left lower extremity was performed prior to intervention.   The lesions were treated with: Left femoropopliteal angioplasty and stenting (7x4024mluvia)  Completion angiography revealed:  Resolution of L SFA / AKPA stenosis at Hunter's canal.  A perclose device was used to close the arteriotomy. Hemostasis was excellent upon completion.  Conscious sedation was administered with the use of IV fentanyl and midazolam under continuous physician and nurse monitoring.  Heart rate, blood pressure, and  oxygen  saturation were continuously monitored.  Total sedation time was 45 minutes  Upon completion of the case instrument and sharps counts were confirmed correct. The patient was transferred to the PACU in good condition. I was present for all portions of the procedure.  PLAN: ASA '81mg'$  PO QD. High intensity statin therapy. OK to resume anticoagulation 8 hours post procedure. No need for DAPT if patient will be anticoagulated with DOAC going forward. Optimized from a vascular perspective. Can proceed with toe amputation at any time.   Yevonne Aline. Stanford Breed, MD [facility] Phone Number: [phone] 11/06/2021 8:39 AM

## 2021-11-06 NOTE — Progress Notes (Signed)
[facility] [place] PROGRESS NOTE  ASSESSMENT / PLAN: [name] [name] is a 88 y.o. male with atherosclerosis of native arteries of left lower extremity causing ulceration.  Patient counseled patients with chronic limb threatening ischemia have an annual risk of cardiovascular mortality of 25% and a high risk of amputation.   WIfI score calculated based on clinical exam and non-invasive measurements. 2 / 1 / 1. Stage 3.  Recommend the following which can slow the progression of atherosclerosis and reduce the risk of major adverse cardiac / limb events:  Complete cessation from all tobacco products. Blood glucose control with goal A1c < 7%. Blood pressure control with goal blood pressure < 140/90 mmHg. Lipid reduction therapy with goal LDL-C <100 mg/dL (<70 if symptomatic from PAD).  Aspirin '81mg'$  PO QD.  Atorvastatin 40-'80mg'$  PO QD (or other "high intensity" statin therapy).  Plan left lower extremity angiogram with possible intervention via right common femoral artery approach in cath lab today.    SUBJECTIVE: No complaints. Ready for procedure.  OBJECTIVE: BP (!) 138/59 (BP Location: Right Arm)   Pulse 61   Temp 97.6 F (36.4 C) (Axillary)   Resp 20   Ht '6\' 2"'$  (1.88 m)   Wt 98 kg   SpO2 100%   BMI 27.73 kg/m   Intake/Output Summary (Last 24 hours) at 11/06/2021 0752 Last data filed at 11/06/2021 0655 Gross per 24 hour  Intake --  Output 1900 ml  Net -1900 ml    No distress No pedal pulses No change in left second toe wound.     Latest Ref Rng & Units 11/06/2021    2:42 AM 11/05/2021    2:27 AM 11/04/2021    2:33 AM  CBC  WBC 4.0 - 10.5 K/uL 5.2  4.5  4.9   Hemoglobin 13.0 - 17.0 g/dL 9.2  8.1  8.6   Hematocrit 39.0 - 52.0 % 31.2  26.4  27.9   Platelets 150 - 400 K/uL 145  143  129         Latest Ref Rng & Units 11/06/2021    2:42 AM 11/04/2021    2:33 AM 11/02/2021   12:12 PM  CMP  Glucose 70 - 99 mg/dL 100  107  98   BUN 8 - 23 mg/dL '16   19  28   '$ Creatinine 0.61 - 1.24 mg/dL 1.35  1.35  1.58   Sodium 135 - 145 mmol/L 137  137  140   Potassium 3.5 - 5.1 mmol/L 4.2  3.9  3.7   Chloride 98 - 111 mmol/L 107  106  102   CO2 22 - 32 mmol/L '20  24  28   '$ Calcium 8.9 - 10.3 mg/dL 9.6  9.6  10.2   Total Protein 6.5 - 8.1 g/dL   7.0   Total Bilirubin 0.3 - 1.2 mg/dL   0.4   Alkaline Phos 38 - 126 U/L   114   AST 15 - 41 U/L   21   ALT 0 - 44 U/L   17     Estimated Creatinine Clearance: 39.7 mL/min (A) (by C-G formula based on SCr of 1.35 mg/dL (H)).  Yevonne Aline. Stanford Breed, MD [facility] Phone Number: [phone] 11/06/2021 7:52 AM

## 2021-11-06 NOTE — Progress Notes (Signed)
ANTICOAGULATION CONSULT NOTE - Follow Up Consult  Pharmacy Consult for Heparin Indication: atrial fibrillation  Allergies  Allergen Reactions   Amoxicillin Other (See Comments)    Headache, debilitated   Bee Venom Anaphylaxis   Avelox [Moxifloxacin] Other (See Comments)    Unknown   Codeine Other (See Comments)    Unknown    Duloxetine Other (See Comments)    Unknown   Duloxetine Hcl Other (See Comments)    felt weird   Levitra [Vardenafil] Other (See Comments)    Unknown   Morphine And Related Other (See Comments)    hypotension   Penicillin G Sodium Other (See Comments)    Severe headache, fatigue "debilitated"   Testosterone Other (See Comments)    Unknown    Tizanidine Other (See Comments)    Unknown     Viagra [Sildenafil] Other (See Comments)    didn't like it    Patient Measurements: Height: '6\' 2"'$  (188 cm) Weight: 98 kg (216 lb) IBW/kg (Calculated) : 82.2 Heparin Dosing Weight: 98 kg  Vital Signs: Temp: 97.5 F (36.4 C) (11/06 1248) Temp Source: Oral (11/06 1248) BP: 141/68 (11/06 1248) Pulse Rate: 73 (11/06 1248)  Labs: Recent Labs    11/04/21 0233 11/04/21 1707 11/05/21 0227 11/05/21 0925 11/05/21 1809 11/06/21 0242  HGB 8.6*  --  8.1*  --   --  9.2*  HCT 27.9*  --  26.4*  --   --  31.2*  PLT 129*  --  143*  --   --  145*  APTT 102*   < > 117* 94* 89*  --   HEPARINUNFRC 0.92*   < > 0.59 0.62 0.62 0.54  CREATININE 1.35*  --   --   --   --  1.35*   < > = values in this interval not displayed.     Estimated Creatinine Clearance: 39.7 mL/min (A) (by C-G formula based on SCr of 1.35 mg/dL (H)).   Medications:  Scheduled:   amiodarone  200 mg Oral Daily   ascorbic acid  500 mg Oral Daily   aspirin EC  81 mg Oral Daily   atorvastatin  40 mg Oral Daily   cholecalciferol  1,000 Units Oral Daily   vitamin B-12  1,000 mcg Oral Daily   docusate sodium  100 mg Oral BID   ezetimibe  10 mg Oral Daily   finasteride  5 mg Oral Daily   gabapentin   200 mg Oral BID   galantamine  8 mg Oral Q breakfast   isosorbide mononitrate  60 mg Oral Daily   memantine  10 mg Oral BID   metoprolol succinate  25 mg Oral Daily   pantoprazole  40 mg Oral Daily   polyethylene glycol  17 g Oral Daily   potassium chloride  10 mEq Oral Daily   predniSONE  3 mg Oral Q breakfast   psyllium  1 packet Oral Daily   sodium chloride flush  3 mL Intravenous Q12H   tamsulosin  0.4 mg Oral QPC supper   Infusions:   sodium chloride 100 mL/hr at 11/06/21 5027   sodium chloride 1 mL/kg/hr (11/06/21 0932)   heparin Stopped (11/06/21 7412)    Assessment: 35 yoM with a history of HTN, HLD, CAD s/p CABG, AF s/p maze on apixaban, HF, s/p TAVR, CVA, PVD, AAA, OSA on CPAP. Patient is presenting with wound infection. Last dose of Eliquis 11/2 AM. Pharmacy consulted to dose heparin.   Heparin level = 0.54, therapeutic  on heparin 1250u/hr Hgb 9.2, Plt 145 - stable, pre-op  Heparin was turned off at 06:44 this AM in anticipation of surgery. Pt is s/p LLE angiogram w/ L fem-pop angioplasty + stenting. Per surgeon's note, plan for anticoagulation with ASA '81mg'$  daily and OK to resume anticoagulation 8 hours post-op. Pt will not require DAPT if anticoagulated with DOAC going forward.  Goal of Therapy:  Heparin level 0.3-0.7 units/ml aPTT 66-102 seconds Monitor platelets by anticoagulation protocol: Yes   Plan:  Resume heparin infusion at 1250 units/hr at 17:00 on 11/6 Check heparin level 8 hours after resuming heparin Monitor daily CBC, heparin level, and for s/sx of bleeding. F/u on surgical plans and transition back to apixaban.   Thank you for allowing [place] to participate in this patients care.  Luisa Hart, PharmD, BCPS Clinical Pharmacist 11/06/2021 1:13 PM   Please refer to AMION for pharmacy phone number

## 2021-11-06 NOTE — Progress Notes (Signed)
Cath Lab advised this nurse to stop heparin in preparation for his abdominal aortogram this morning. Pharmacy notified.

## 2021-11-06 NOTE — Plan of Care (Signed)
  Problem: Education: Goal: Knowledge of General Education information will improve Description: Including pain rating scale, medication(s)/side effects and non-pharmacologic comfort measures 11/06/2021 0240 by Suzy Bouchard, RN Outcome: Progressing 11/06/2021 0236 by Suzy Bouchard, RN Outcome: Progressing   Problem: Activity: Goal: Risk for activity intolerance will decrease Outcome: Progressing   Problem: Nutrition: Goal: Adequate nutrition will be maintained Outcome: Progressing   Problem: Coping: Goal: Level of anxiety will decrease 11/06/2021 0240 by Suzy Bouchard, RN Outcome: Progressing 11/06/2021 0236 by Suzy Bouchard, RN Outcome: Progressing   Problem: Elimination: Goal: Will not experience complications related to bowel motility Outcome: Progressing   Problem: Pain Managment: Goal: General experience of comfort will improve Outcome: Progressing   Problem: Safety: Goal: Ability to remain free from injury will improve 11/06/2021 0240 by Suzy Bouchard, RN Outcome: Progressing 11/06/2021 0236 by Suzy Bouchard, RN Outcome: Progressing   Problem: Skin Integrity: Goal: Risk for impaired skin integrity will decrease Outcome: Progressing

## 2021-11-06 NOTE — Plan of Care (Signed)

## 2021-11-07 ENCOUNTER — Encounter (HOSPITAL_COMMUNITY): Payer: Self-pay | Admitting: Vascular Surgery

## 2021-11-07 DIAGNOSIS — I739 Peripheral vascular disease, unspecified: Secondary | ICD-10-CM | POA: Diagnosis not present

## 2021-11-07 DIAGNOSIS — I480 Paroxysmal atrial fibrillation: Secondary | ICD-10-CM | POA: Diagnosis not present

## 2021-11-07 DIAGNOSIS — M869 Osteomyelitis, unspecified: Secondary | ICD-10-CM

## 2021-11-07 DIAGNOSIS — I5032 Chronic diastolic (congestive) heart failure: Secondary | ICD-10-CM | POA: Diagnosis not present

## 2021-11-07 DIAGNOSIS — I96 Gangrene, not elsewhere classified: Secondary | ICD-10-CM

## 2021-11-07 DIAGNOSIS — M868X7 Other osteomyelitis, ankle and foot: Secondary | ICD-10-CM

## 2021-11-07 DIAGNOSIS — I10 Essential (primary) hypertension: Secondary | ICD-10-CM | POA: Diagnosis not present

## 2021-11-07 DIAGNOSIS — L89610 Pressure ulcer of right heel, unstageable: Secondary | ICD-10-CM

## 2021-11-07 DIAGNOSIS — L97519 Non-pressure chronic ulcer of other part of right foot with unspecified severity: Secondary | ICD-10-CM | POA: Diagnosis not present

## 2021-11-07 LAB — CBC
HCT: 26.9 % — ABNORMAL LOW (ref 39.0–52.0)
Hemoglobin: 8.6 g/dL — ABNORMAL LOW (ref 13.0–17.0)
MCH: 29.1 pg (ref 26.0–34.0)
MCHC: 32 g/dL (ref 30.0–36.0)
MCV: 90.9 fL (ref 80.0–100.0)
Platelets: 139 10*3/uL — ABNORMAL LOW (ref 150–400)
RBC: 2.96 MIL/uL — ABNORMAL LOW (ref 4.22–5.81)
RDW: 16 % — ABNORMAL HIGH (ref 11.5–15.5)
WBC: 5.2 10*3/uL (ref 4.0–10.5)
nRBC: 0 % (ref 0.0–0.2)

## 2021-11-07 LAB — BASIC METABOLIC PANEL
Anion gap: 9 (ref 5–15)
BUN: 15 mg/dL (ref 8–23)
CO2: 21 mmol/L — ABNORMAL LOW (ref 22–32)
Calcium: 9.6 mg/dL (ref 8.9–10.3)
Chloride: 108 mmol/L (ref 98–111)
Creatinine, Ser: 1.45 mg/dL — ABNORMAL HIGH (ref 0.61–1.24)
GFR, Estimated: 45 mL/min — ABNORMAL LOW (ref >60)
Glucose, Bld: 99 mg/dL (ref 70–99)
Potassium: 4.2 mmol/L (ref 3.5–5.1)
Sodium: 138 mmol/L (ref 135–145)

## 2021-11-07 LAB — LIPID PANEL
Cholesterol: 116 mg/dL (ref 0–200)
HDL: 51 mg/dL (ref >40)
LDL Cholesterol: 48 mg/dL (ref 0–99)
Total CHOL/HDL Ratio: 2.3 RATIO
Triglycerides: 87 mg/dL (ref <150)
VLDL: 17 mg/dL (ref 0–40)

## 2021-11-07 LAB — CULTURE, BLOOD (ROUTINE X 2)
Culture: NO GROWTH
Culture: NO GROWTH
Special Requests: ADEQUATE
Special Requests: ADEQUATE

## 2021-11-07 LAB — HEPARIN LEVEL (UNFRACTIONATED)
Heparin Unfractionated: 0.35 IU/mL (ref 0.30–0.70)
Heparin Unfractionated: 0.45 IU/mL (ref 0.30–0.70)

## 2021-11-07 LAB — SURGICAL PCR SCREEN
MRSA, PCR: NEGATIVE
Staphylococcus aureus: NEGATIVE

## 2021-11-07 MED ORDER — CHLORHEXIDINE GLUCONATE 4 % EX LIQD: 60.0000 mL | Freq: Once | CUTANEOUS | Status: DC

## 2021-11-07 NOTE — Progress Notes (Addendum)
  Progress Note    11/07/2021 7:14 AM 1 Day Post-Op  Subjective:  no complaints  afebrile  Vitals:   11/06/21 2357 11/07/21 0426  BP: (!) 116/57 133/64  Pulse: 62 68  Resp: 17 13  Temp: 98.1 F (36.7 C) (!) 97.1 F (36.2 C)  SpO2: 98% 97%    Physical Exam: General:  no distress Cardiac:  regular Lungs:  non labored Incisions:  right groin is soft without hematoma Extremities:  brisk doppler flow left DP/PT/pero   CBC    Component Value Date/Time   WBC 5.2 11/07/2021 0137   RBC 2.96 (L) 11/07/2021 0137   HGB 8.6 (L) 11/07/2021 0137   HGB 9.5 (L) 10/24/2021 1228   HGB 10.8 (L) 09/23/2019 1341   HCT 26.9 (L) 11/07/2021 0137   HCT 32.9 (L) 09/23/2019 1341   PLT 139 (L) 11/07/2021 0137   PLT 147 (L) 10/24/2021 1228   PLT 199 09/23/2019 1341   MCV 90.9 11/07/2021 0137   MCV 95 09/23/2019 1341   MCH 29.1 11/07/2021 0137   MCHC 32.0 11/07/2021 0137   RDW 16.0 (H) 11/07/2021 0137   RDW 13.2 09/23/2019 1341   LYMPHSABS 1.0 11/02/2021 1212   MONOABS 0.6 11/02/2021 1212   EOSABS 0.1 11/02/2021 1212   BASOSABS 0.0 11/02/2021 1212    BMET    Component Value Date/Time   NA 138 11/07/2021 0137   NA 136 06/16/2020 1607   K 4.2 11/07/2021 0137   CL 108 11/07/2021 0137   CO2 21 (L) 11/07/2021 0137   GLUCOSE 99 11/07/2021 0137   BUN 15 11/07/2021 0137   BUN 38 (H) 06/16/2020 1607   CREATININE 1.45 (H) 11/07/2021 0137   CREATININE 1.95 (H) 04/28/2021 1123   CREATININE 1.1 04/22/2015 1029   CREATININE 1.40 (H) 03/30/2014 1157   CALCIUM 9.6 11/07/2021 0137   GFRNONAA 45 (L) 11/07/2021 0137   GFRNONAA 32 (L) 04/28/2021 1123   GFRAA 38 (L) 12/18/2019 1124    INR    Component Value Date/Time   INR 1.1 11/02/2021 1212     Intake/Output Summary (Last 24 hours) at 11/07/2021 0714 Last data filed at 11/07/2021 0430 Gross per 24 hour  Intake --  Output 1250 ml  Net -1250 ml      Assessment/Plan:  88 y.o. male is s/p:  Angiogram with left femoropopliteal  angioplasty and stenting  1 Day Post-Op   -pt with brisk doppler flow left DP/PT/pero -ok to proceed with toe amputation  -pt will be on asa and DOAC -f/u in 6 weeks in our office for LLE arterial duplex and ABI.  Our office will arrange appt.    Leontine Locket, PA-C [facility] [phone] 11/07/2021 7:14 AM  Addendum:  per Dr. Stanford Breed, will observe the right leg and if not any better in a few weeks, we can intervene on the RLE.  Leontine Locket, [facility] 11/07/2021 9:34 AM   VASCULAR STAFF ADDENDUM: I have independently interviewed and examined the patient. I agree with the above.  Groin soft. Appreciate Dr. Jess Barters evaluation. Reviewed angiogram findings with patient. Will observe RLE. May need angiogram in the future. Will see again in a month in the office with ABI / LLE duplex. Please call for questions.  Yevonne Aline. Stanford Breed, MD [facility] Phone Number: [phone] 11/07/2021 12:50 PM

## 2021-11-07 NOTE — Progress Notes (Signed)
ANTICOAGULATION CONSULT NOTE - Follow Up Consult  Pharmacy Consult for Heparin Indication: atrial fibrillation  Allergies  Allergen Reactions   Amoxicillin Other (See Comments)    Headache, debilitated   Bee Venom Anaphylaxis   Avelox [Moxifloxacin] Other (See Comments)    Unknown   Codeine Other (See Comments)    Unknown    Duloxetine Other (See Comments)    Unknown   Duloxetine Hcl Other (See Comments)    felt weird   Levitra [Vardenafil] Other (See Comments)    Unknown   Morphine And Related Other (See Comments)    hypotension   Penicillin G Sodium Other (See Comments)    Severe headache, fatigue "debilitated"   Testosterone Other (See Comments)    Unknown    Tizanidine Other (See Comments)    Unknown     Viagra [Sildenafil] Other (See Comments)    didn't like it    Patient Measurements: Height: '6\' 2"'$  (188 cm) Weight: 98 kg (216 lb) IBW/kg (Calculated) : 82.2 Heparin Dosing Weight: 98 kg  Vital Signs: Temp: 97.1 F (36.2 C) (11/07 0426) Temp Source: Axillary (11/07 0426) BP: 133/64 (11/07 0426) Pulse Rate: 68 (11/07 0426)  Labs: Recent Labs    11/05/21 0227 11/05/21 0925 11/05/21 1809 11/06/21 0242 11/07/21 0137  HGB 8.1*  --   --  9.2* 8.6*  HCT 26.4*  --   --  31.2* 26.9*  PLT 143*  --   --  145* 139*  APTT 117* 94* 89*  --   --   HEPARINUNFRC 0.59 0.62 0.62 0.54 0.35  CREATININE  --   --   --  1.35* 1.45*     Estimated Creatinine Clearance: 37 mL/min (A) (by C-G formula based on SCr of 1.45 mg/dL (H)).   Medications:  Scheduled:   amiodarone  200 mg Oral Daily   ascorbic acid  500 mg Oral Daily   aspirin EC  81 mg Oral Daily   atorvastatin  40 mg Oral Daily   cholecalciferol  1,000 Units Oral Daily   vitamin B-12  1,000 mcg Oral Daily   docusate sodium  100 mg Oral BID   ezetimibe  10 mg Oral Daily   finasteride  5 mg Oral Daily   gabapentin  200 mg Oral BID   galantamine  8 mg Oral Q breakfast   isosorbide mononitrate  60 mg Oral  Daily   memantine  10 mg Oral BID   metoprolol succinate  25 mg Oral Daily   pantoprazole  40 mg Oral Daily   polyethylene glycol  17 g Oral Daily   potassium chloride  10 mEq Oral Daily   predniSONE  3 mg Oral Q breakfast   psyllium  1 packet Oral Daily   sodium chloride flush  3 mL Intravenous Q12H   sodium chloride flush  3 mL Intravenous Q12H   tamsulosin  0.4 mg Oral QPC supper   Infusions:   sodium chloride 100 mL/hr at 11/06/21 5176   sodium chloride     heparin 1,250 Units/hr (11/06/21 1715)    Assessment: 12 yoM with a history of HTN, HLD, CAD s/p CABG, AF s/p maze on apixaban, HF, s/p TAVR, CVA, PVD, AAA, OSA on CPAP. Patient is presenting with wound infection. Last dose of Eliquis 11/2 AM. Pharmacy consulted to dose heparin. Pt is s/p LLE angiogram w/ L fem-pop angioplasty + stenting on 11/6.  Heparin level = 0.35, therapeutic on heparin 1250u/hr Hgb 8.6, Plt 139 - stable, post-op  No s/sx of bleeding noted post-op on heparin infusion.  Goal of Therapy:  Heparin level 0.3-0.7 units/ml aPTT 66-102 seconds Monitor platelets by anticoagulation protocol: Yes   Plan:  Continue heparin infusion at 1250 units/hr  Check heparin level 8 hours Monitor daily CBC, heparin level, and for s/sx of bleeding. Planning to proceed with L toe amputation soon F/u on surgical plans with orthopedic surgery and transition back to apixaban. Per Dr. Mora Appl post-op note on 11/6, pt will not require DAPT if anticoagulated with DOAC going forward.   Thank you for allowing [place] to participate in this patients care.  Luisa Hart, PharmD, BCPS Clinical Pharmacist 11/07/2021 8:08 AM   Please refer to [facility] for pharmacy phone number

## 2021-11-07 NOTE — Consult Note (Signed)
ORTHOPAEDIC CONSULTATION  REQUESTING PHYSICIAN: Flora Lipps, MD  Chief Complaint: Gangrene with osteomyelitis left foot second toe.  HPI: [name] [name] is a 88 y.o. male who presents with exposed necrotic ulcer over the PIP joint left foot second toe with exposed joint and purulent drainage.  Patient is status post endovascular revascularization with a strong dopplerable dorsalis pedis pulse at this time.  Patient also has a decubitus ulcer on the right he and a superficial ulcer on the second toe right foot.  Past Medical History:  Diagnosis Date   AAA (abdominal aortic aneurysm) (HCC)    Arthritis    Ascending aorta dilatation ([place]) 03/13/2012   Fusiform dilatation of the ascending thoracic aorta discovered during surgery, max diameter 4.2-4.3 cm   BPH (benign prostatic hypertrophy)    Chronic systolic heart failure ([place]) 03/03/2012   Chronic venous insufficiency    GERD (gastroesophageal reflux disease)    Glaucoma    Gout    History of kidney stones    Hyperlipidemia    Hypertension    Obesity (BMI 30-39.9)    Paroxysmal atrial fibrillation ([place]) 02/29/2012   Recurrent paroxysmal, new-onset    PMR (polymyalgia rheumatica) (HCC)    Polymyalgia rheumatica ([place]) 02/06/2019   Pre-diabetes    S/P CABG x 3 03/06/2012   LIMA to LAD, SVG to OM, SVG to RCA, EVH via right thigh   S/P Maze operation for atrial fibrillation 03/06/2012   Complete bilateral lesions set using bipolar radiofrequency and cryothermy ablation with clipping of LA appendage   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   s/p TAVR with a 26 mm Edwards via the left subclavian by Drs Buena Irish and Bartle   Severe aortic stenosis 09/29/2019   Sleep apnea    USES CPAP NIGHTLY   Past Surgical History:  Procedure Laterality Date   ABDOMINAL AORTOGRAM W/LOWER EXTREMITY N/A 10/26/2020   Procedure: ABDOMINAL AORTOGRAM W/LOWER EXTREMITY;  Surgeon: Broadus John, MD;  Location: [place]  [facility];  Service: Cardiovascular;  Laterality: N/A;   ABDOMINAL AORTOGRAM W/LOWER EXTREMITY N/A 11/06/2021   Procedure: ABDOMINAL AORTOGRAM W/LOWER EXTREMITY;  Surgeon: Cherre Robins, MD;  Location: [place] [facility];  Service: Cardiovascular;  Laterality: N/A;   CARDIAC CATHETERIZATION     CATARACT EXTRACTION, BILATERAL     with lens implants   CHOLECYSTECTOMY N/A 03/24/2012   Procedure: LAPAROSCOPIC CHOLECYSTECTOMY WITH INTRAOPERATIVE CHOLANGIOGRAM;  Surgeon: Zenovia Jarred, MD;  Location: [facility];  Service: General;  Laterality: N/A;   CORONARY ARTERY BYPASS GRAFT N/A 03/06/2012   Procedure: CORONARY ARTERY BYPASS GRAFTING (CABG);  Surgeon: Rexene Alberts, MD;  Location: [place];  Service: Open Heart Surgery;  Laterality: N/A;   ENDOVEIN HARVEST OF GREATER SAPHENOUS VEIN Right 03/06/2012   Procedure: ENDOVEIN HARVEST OF GREATER SAPHENOUS VEIN;  Surgeon: Rexene Alberts, MD;  Location: [place];  Service: Open Heart Surgery;  Laterality: Right;   INTRAOPERATIVE TRANSESOPHAGEAL ECHOCARDIOGRAM N/A 03/06/2012   Procedure: INTRAOPERATIVE TRANSESOPHAGEAL ECHOCARDIOGRAM;  Surgeon: Rexene Alberts, MD;  Location: [place];  Service: Open Heart Surgery;  Laterality: N/A;   IR KYPHO LUMBAR INC FX REDUCE BONE BX UNI/BIL CANNULATION INC/IMAGING  02/23/2020   LEFT HEART CATH Right 03/02/2012   Procedure: LEFT HEART CATH;  Surgeon: Sherren Mocha, MD;  Location: [facility] C[facility];  Service: Cardiovascular;  Laterality: Right;   MAZE N/A 03/06/2012   Procedure: MAZE;  Surgeon: Rexene Alberts, MD;  Location: [place];  Service: Open Heart  Surgery;  Laterality: N/A;   PERIPHERAL VASCULAR INTERVENTION Right 10/26/2020   Procedure: PERIPHERAL VASCULAR INTERVENTION;  Surgeon: Broadus John, MD;  Location: [place] [facility];  Service: Cardiovascular;  Laterality: Right;   PERIPHERAL VASCULAR INTERVENTION  11/06/2021   Procedure: PERIPHERAL VASCULAR INTERVENTION;  Surgeon: Cherre Robins, MD;  Location: [place] [facility];   Service: Cardiovascular;;   PROSTATECTOMY     partial   RIGHT/LEFT HEART CATH AND CORONARY/GRAFT ANGIOGRAPHY N/A 09/29/2019   Procedure: RIGHT/LEFT HEART CATH AND CORONARY/GRAFT ANGIOGRAPHY;  Surgeon: Adrian Prows, MD;  Location: [place] [facility];  Service: Cardiovascular;  Laterality: N/A;   TEE WITHOUT CARDIOVERSION N/A 11/10/2019   Procedure: TRANSESOPHAGEAL ECHOCARDIOGRAM (TEE);  Surgeon: Burnell Blanks, MD;  Location: [place];  Service: Open Heart Surgery;  Laterality: N/A;   TRANSCATHETER AORTIC VALVE REPLACEMENT, TRANSFEMORAL  11/10/2019   Social History   Socioeconomic History   Marital status: Married    Spouse name: Not on file   Number of children: 4   Years of education: Not on file   Highest education level: Not on file  Occupational History   Occupation: retired    Comment: [employer]  Tobacco Use   Smoking status: Former    Packs/day: 0.50    Years: 15.00    Total pack years: 7.50    Types: Cigarettes    Quit date: 11/18/1962    Years since quitting: 59.0    Passive exposure: Never   Smokeless tobacco: Never  Vaping Use   Vaping Use: Never used  Substance and Sexual Activity   Alcohol use: Yes    Alcohol/week: 2.0 standard drinks of alcohol    Types: 2 Shots of liquor per week    Comment: daily, not in last few days   Drug use: Never   Sexual activity: Yes  Other Topics Concern   Not on file  Social History Narrative   Lives with wife in [place].     Very active physically without significant limitations.    Exercises regularly   Social Determinants of Health   Financial Resource Strain: Not on file  Food Insecurity: Not on file  Transportation Needs: Not on file  Physical Activity: Not on file  Stress: Not on file  Social Connections: Not on file   Family History  Problem Relation Age of Onset   Dementia Mother    Stroke Brother    - negative except otherwise stated in the family history section Allergies  Allergen Reactions    Amoxicillin Other (See Comments)    Headache, debilitated   Bee Venom Anaphylaxis   Avelox [Moxifloxacin] Other (See Comments)    Unknown   Codeine Other (See Comments)    Unknown    Duloxetine Other (See Comments)    Unknown   Duloxetine Hcl Other (See Comments)    felt weird   Levitra [Vardenafil] Other (See Comments)    Unknown   Morphine And Related Other (See Comments)    hypotension   Penicillin G Sodium Other (See Comments)    Severe headache, fatigue "debilitated"   Testosterone Other (See Comments)    Unknown    Tizanidine Other (See Comments)    Unknown     Viagra [Sildenafil] Other (See Comments)    didn't like it   Prior to Admission medications   Medication Sig Start Date End Date Taking? Authorizing Provider  acetaminophen (TYLENOL) 325 MG tablet Take 2 tablets (650 mg total) by mouth every 4 (four) hours  as needed for mild pain (or temp > 37.5 C (99.5 F)). Patient taking differently: Take 650 mg by mouth in the morning and at bedtime. 06/13/21  Yes Angiulli, Lavon Paganini, PA-C  amiodarone (PACERONE) 200 MG tablet Take 1 tablet (200 mg total) by mouth daily. 06/13/21  Yes Angiulli, Lavon Paganini, PA-C  apixaban (ELIQUIS) 2.5 MG TABS tablet Take 2.5 mg by mouth 2 (two) times daily.   Yes [provider]  cholecalciferol (VITAMIN D3) 25 MCG (1000 UNIT) tablet Take 1 tablet (1,000 Units total) by mouth daily. 06/13/21  Yes Angiulli, Lavon Paganini, PA-C  Cyanocobalamin (VITAMIN B-12) 5000 MCG TBDP Take 5,000 mcg by mouth daily. 06/13/21  Yes Angiulli, Lavon Paganini, PA-C  cyclobenzaprine (FLEXERIL) 5 MG tablet Take 1 tablet (5 mg total) by mouth 3 (three) times daily as needed for muscle spasms. Patient taking differently: Take 5 mg by mouth at bedtime as needed for muscle spasms. 10/19/21  Yes Kirsteins, Luanna Salk, MD  denosumab (PROLIA) 60 MG/ML SOSY injection Inject 60 mg into the skin every 6 (six) months.   Yes [provider]  docusate sodium (COLACE) 100 MG capsule Take  1 capsule (100 mg total) by mouth 2 (two) times daily. 08/23/20  Yes Hongalgi, Everlene Farrier D, MD  EPINEPHrine 0.3 mg/0.3 mL IJ SOAJ injection Inject 0.3 mg into the muscle as needed for anaphylaxis.   Yes [provider]  ezetimibe (ZETIA) 10 MG tablet Take 1 tablet (10 mg total) by mouth daily. 06/13/21  Yes Angiulli, Lavon Paganini, PA-C  finasteride (PROSCAR) 5 MG tablet Take 1 tablet (5 mg total) by mouth daily. 06/13/21  Yes Angiulli, Lavon Paganini, PA-C  fish oil-omega-3 fatty acids 1000 MG capsule Take 1 capsule (1 g total) by mouth daily. 06/13/21  Yes Angiulli, Lavon Paganini, PA-C  gabapentin (NEURONTIN) 100 MG capsule Take 2 capsules by mouth in the morning, 1 capsule in the evening, and 2 capsules at bedtime. Patient taking differently: Take 100-200 mg by mouth See admin instructions. Take 200 mg by mouth in the morning and at bedtime. Take 100 mg by mouth in the evening. 06/13/21  Yes Angiulli, Lavon Paganini, PA-C  galantamine (RAZADYNE ER) 8 MG 24 hr capsule Take 1 capsule (8 mg total) by mouth daily with breakfast. 06/13/21  Yes Angiulli, Lavon Paganini, PA-C  isosorbide mononitrate (IMDUR) 60 MG 24 hr tablet Take 1 tablet (60 mg total) by mouth daily. 06/13/21  Yes Angiulli, Lavon Paganini, PA-C  leflunomide (ARAVA) 20 MG tablet Take 1/2 tablets (10 mg total) by mouth daily. 06/13/21  Yes Angiulli, Lavon Paganini, PA-C  Magnesium 250 MG TABS Take 1 tablet (250 mg total) by mouth daily. 06/13/21  Yes Angiulli, Lavon Paganini, PA-C  memantine (NAMENDA) 10 MG tablet Take 1 tablet (10 mg total) by mouth 2 (two) times daily. 06/13/21  Yes Angiulli, Lavon Paganini, PA-C  nitroGLYCERIN (NITROSTAT) 0.4 MG SL tablet Place 1 tablet (0.4 mg total) under the tongue every 5 (five) minutes x 3 doses as needed for chest pain. 12/16/19  Yes Cantwell, Celeste C, PA-C  pantoprazole (PROTONIX) 40 MG tablet Take 40 mg by mouth daily.   Yes [provider]  polyethylene glycol (MIRALAX) 17 g packet Take 17 g by mouth daily.   Yes [provider]   potassium chloride (KLOR-CON M) 10 MEQ tablet Take 1 tablet by mouth everyday with furosemide Patient taking differently: Take 10 mEq by mouth daily. 06/13/21  Yes Angiulli, Lavon Paganini, PA-C  predniSONE (DELTASONE) 1 MG  tablet Take 3 tablets (3 mg total) by mouth daily with breakfast. 06/13/21  Yes Angiulli, Lavon Paganini, PA-C  Psyllium Husk POWD Take 1 Scoop by mouth daily.    Yes [provider]  tamsulosin (FLOMAX) 0.4 MG CAPS capsule Take 1 capsule (0.4 mg total) by mouth daily after supper. 06/13/21  Yes Angiulli, Lavon Paganini, PA-C  torsemide (DEMADEX) 20 MG tablet Take 2 tablets (40 mg total) by mouth daily. 06/13/21  Yes Angiulli, Lavon Paganini, PA-C  traMADol (ULTRAM) 50 MG tablet Take 1/2 tablets (25 mg total) by mouth every 6 (six) hours as needed for moderate pain. Patient taking differently: Take 50 mg by mouth daily as needed for moderate pain. 10/19/21  Yes Kirsteins, Luanna Salk, MD  triamcinolone cream (KENALOG) 0.1 % Apply 1 Application topically as needed (rash/itching).   Yes [provider]  vitamin C (ASCORBIC ACID) 500 MG tablet Take 1 tablet (500 mg total) by mouth daily. 06/13/21  Yes Angiulli, Lavon Paganini, PA-C  amLODipine (NORVASC) 10 MG tablet 1 tablet Orally Once a day Patient not taking: Reported on 10/19/2021    [provider]  atorvastatin (LIPITOR) 40 MG tablet Take 1 tablet (40 mg total) by mouth daily. 11/03/21   Adrian Prows, MD  collagenase Annitta Needs) 250 UNIT/GM ointment     [provider]  epoetin alfa-epbx (RETACRIT) 2000 UNIT/ML injection ? dose as directed per hematology Injection every 3 weeks. Patient not taking: Reported on 11/03/2021    [provider]  metoprolol succinate (TOPROL-XL) 25 MG 24 hr tablet     [provider]  potassium chloride (MICRO-K) 10 MEQ CR capsule TAKE ONE CAPSULE BY MOUTH EVERY DAY WITH LASIX Patient taking differently: Take 10 mEq by mouth daily. 02/16/21 06/13/21  Cantwell, Gerline Legacy, PA-C    PERIPHERAL VASCULAR CATHETERIZATION  Result Date: 11/06/2021 Table formatting from the original result was not included. Images from the original result were not included. DATE OF SERVICE: 11/06/2021  PATIENT:  [name]  88 y.o. male  PRE-OPERATIVE DIAGNOSIS:  Atherosclerosis of native arteries of left lower extremity causing ulceration  POST-OPERATIVE DIAGNOSIS:  Same  PROCEDURE:  1) Ultrasound guided right common femoral artery access 2) Aortogram 3) Left lower extremity angiogram with third order cannulation (24m total contrast) 4) Left femoropopliteal angioplasty and stenting ([id number]) 5) Conscious sedation (45 minutes)  SURGEON:  ThYevonne AlineHaStanford BreedMD  ASSISTANT: none  ANESTHESIA:   local and IV sedation  ESTIMATED BLOOD LOSS: minimal  LOCAL MEDICATIONS USED:  LIDOCAINE  COUNTS: confirmed correct.  PATIENT DISPOSITION:  PACU - hemodynamically stable.  Delay start of Pharmacological VTE agent (>24hrs) due to surgical blood loss or risk of bleeding: no  INDICATION FOR PROCEDURE: DaKACPER CARTLIDGEs a [age].o. male with left second toe ulceration and non-invasive evidence of peripheral arterial disease. After careful discussion of risks, benefits, and alternatives the patient was offered angiography. The patient understood and wished to proceed.  OPERATIVE FINDINGS: Terminal aorta and iliac arteries: Small juxtarenal aneurysm Tortuous Widely patent  Left lower extremity: Common femoral artery: diffuse disease, but patent. No stenosis >50%. Profunda femoris artery: diffuse disease, but patent. Superficial femoral artery: diffuse disease, but patent. >80% stenosis at Hunter's canal. Popliteal artery: >80% stenosis at Hunter's canal. Patent beyond this lesion without stenosis. Anterior tibial artery: occludes several centimeters beyond its origin Tibioperoneal trunk: diffuse disease, but patent. Peroneal artery: small, tapers to occlusion in mid calf Posterior tibial artery: dominant. Widely patent. Fills  the  foot. Pedal circulation: fills via PT. Less disease than anticipated.  GLASS score. FP 2. IP 0. Stage 1 disease.  WIfI score calculated based on clinical exam and non-invasive measurements. 2 / 1 / 1. Stage 3.  DESCRIPTION OF PROCEDURE: After identification of the patient in the pre-operative holding area, the patient was transferred to the operating room. The patient was positioned supine on the operating room table. Anesthesia was induced. The groins was prepped and draped in standard fashion. A surgical pause was performed confirming correct patient, procedure, and operative location.  The right groin was anesthetized with subcutaneous injection of 1% lidocaine. Using ultrasound guidance, the right common femoral artery was accessed with micropuncture technique. Fluoroscopy was used to confirm cannulation over the femoral head. The 22F sheath was upsized to 20F.  A Benson wire was advanced into the distal aorta. Over the wire an omni flush catheter was advanced to the level of L2. Aortogram was performed - see above for details.  The left common iliac artery was selected with an omniflush catheter and Benson guidewire. The wire was advanced into the common femoral artery. Over the wire the omni flush catheter was advanced into the external iliac artery. Selective angiography was performed - see above for details.  The decision was made to intervene. The patient was heparinized with 10,000 units of heparin. The 20F sheath was exchanged for a 70F x 45cm sheath. Selective angiography of the left lower extremity was performed prior to intervention.  The lesions were treated with: Left femoropopliteal angioplasty and stenting (7x38m Eluvia)  Completion angiography revealed: Resolution of L SFA / AKPA stenosis at Hunter's canal.  A perclose device was used to close the arteriotomy. Hemostasis was excellent upon completion.  Conscious sedation was administered with the use of IV fentanyl and midazolam under continuous  physician and nurse monitoring.  Heart rate, blood pressure, and oxygen saturation were continuously monitored.  Total sedation time was 45 minutes  Upon completion of the case instrument and sharps counts were confirmed correct. The patient was transferred to the PACU in good condition. I was present for all portions of the procedure.  PLAN: ASA '81mg'$  PO QD. High intensity statin therapy. OK to resume anticoagulation 8 hours post procedure. No need for DAPT if patient will be anticoagulated with DOAC going forward. Optimized from a vascular perspective. Can proceed with toe amputation at any time.  TYevonne Aline HStanford Breed MD [facility] HospitalPhone Number: ([phone]/06/2021 8:39 AM    - pertinent xrays, CT, MRI studies were reviewed and independently interpreted  Positive ROS: All other systems have been reviewed and were otherwise negative with the exception of those mentioned in the HPI and as above.  Physical Exam: General: Alert, no acute distress Psychiatric: Patient is competent for consent with normal mood and affect Lymphatic: No axillary or cervical lymphadenopathy Cardiovascular: No pedal edema Respiratory: No cyanosis, no use of accessory musculature GI: No organomegaly, abdomen is soft and non-tender    Images:  '@ENCIMAGES'$ @  Labs:  Lab Results  Component Value Date   HGBA1C 5.8 (H) 05/25/2021   HGBA1C 6.1 (H) 11/06/2019   HGBA1C 5.9 (H) 10/22/2019   ESRSEDRATE 103 (H) 11/02/2021   CRP 5.0 (H) 11/02/2021   LABURIC 5.9 10/22/2020   REPTSTATUS 11/07/2021 FINAL 11/02/2021   CULT  11/02/2021    NO GROWTH 5 DAYS Performed at [facility] [id number], [place] [id number]   [place](  [place]) 03/14/2021    Lab Results  Component Value Date   ALBUMIN 3.1 (L) 11/02/2021   ALBUMIN 2.7 (L) 06/01/2021   ALBUMIN 3.3 (L) 05/25/2021   PREALBUMIN 19.4 03/03/2012   LABURIC 5.9 10/22/2020        Latest Ref Rng  & Units 11/07/2021    1:37 AM 11/06/2021    2:42 AM 11/05/2021    2:27 AM  CBC EXTENDED  WBC 4.0 - 10.5 K/uL 5.2  5.2  4.5   RBC 4.22 - 5.81 MIL/uL 2.96  3.25  2.85   Hemoglobin 13.0 - 17.0 g/dL 8.6  9.2  8.1   HCT 39.0 - 52.0 % 26.9  31.2  26.4   Platelets 150 - 400 K/uL 139  145  143     Neurologic: Patient does not have protective sensation bilateral lower extremities.   MUSCULOSKELETAL:   Skin: Examination the black eschar is removed from the left foot second toe.  There is purulent drainage and exposed PIP joint.  Patient has excellent revascularization to the left lower extremity with a multiphasic dopplerable dorsalis pedis pulse.  On the right lower extremity patient has a black decubitus ulcer on the right heel 3 cm in diameter.  There is also a small ulcer over the PIP joint of the right second toe.  Ankle-brachial indices prior to revascularization on the left side showed a great toe pressure of 52 with monophasic flow.  Right-sided flow was also monophasic with a great toe pressure of 75.  Right ABI 0.65 previous ABI 1.09.  Left ABI 1.22 with a previous ABI of 1.19.  Assessment: Assessment: Peripheral vascular disease bilateral lower extremities status post endovascular revascularization of the left lower extremity with gangrene abscess osteomyelitis of the left foot second toe with a right heel decubitus ulcer and ulcer to the right foot second toe.  Plan: Plan: We will plan for a left foot second ray amputation tomorrow Wednesday.  Risk and benefits were discussed including risk of the wound not healing need for additional surgery.  Patient states he understands wished to proceed at this time.  Patient is currently in a good protective boot for the right lower extremity.   Thank you for the consult and the opportunity to see Mr. Auguste Tebbetts, [place] [phone] 10:50 AM

## 2021-11-07 NOTE — H&P (View-Only) (Signed)
ORTHOPAEDIC CONSULTATION  REQUESTING PHYSICIAN: Flora Lipps, MD  Chief Complaint: Gangrene with osteomyelitis left foot second toe.  HPI: [name] [name] is a 88 y.o. male who presents with exposed necrotic ulcer over the PIP joint left foot second toe with exposed joint and purulent drainage.  Patient is status post endovascular revascularization with a strong dopplerable dorsalis pedis pulse at this time.  Patient also has a decubitus ulcer on the right he and a superficial ulcer on the second toe right foot.  Past Medical History:  Diagnosis Date   AAA (abdominal aortic aneurysm) (HCC)    Arthritis    Ascending aorta dilatation ([place]) 03/13/2012   Fusiform dilatation of the ascending thoracic aorta discovered during surgery, max diameter 4.2-4.3 cm   BPH (benign prostatic hypertrophy)    Chronic systolic heart failure ([place]) 03/03/2012   Chronic venous insufficiency    GERD (gastroesophageal reflux disease)    Glaucoma    Gout    History of kidney stones    Hyperlipidemia    Hypertension    Obesity (BMI 30-39.9)    Paroxysmal atrial fibrillation ([place]) 02/29/2012   Recurrent paroxysmal, new-onset    PMR (polymyalgia rheumatica) (HCC)    Polymyalgia rheumatica ([place]) 02/06/2019   Pre-diabetes    S/P CABG x 3 03/06/2012   LIMA to LAD, SVG to OM, SVG to RCA, EVH via right thigh   S/P Maze operation for atrial fibrillation 03/06/2012   Complete bilateral lesions set using bipolar radiofrequency and cryothermy ablation with clipping of LA appendage   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   s/p TAVR with a 26 mm Edwards via the left subclavian by Drs Buena Irish and Bartle   Severe aortic stenosis 09/29/2019   Sleep apnea    USES CPAP NIGHTLY   Past Surgical History:  Procedure Laterality Date   ABDOMINAL AORTOGRAM W/LOWER EXTREMITY N/A 10/26/2020   Procedure: ABDOMINAL AORTOGRAM W/LOWER EXTREMITY;  Surgeon: Broadus John, MD;  Location: [place]  [facility];  Service: Cardiovascular;  Laterality: N/A;   ABDOMINAL AORTOGRAM W/LOWER EXTREMITY N/A 11/06/2021   Procedure: ABDOMINAL AORTOGRAM W/LOWER EXTREMITY;  Surgeon: Cherre Robins, MD;  Location: [place] [facility];  Service: Cardiovascular;  Laterality: N/A;   CARDIAC CATHETERIZATION     CATARACT EXTRACTION, BILATERAL     with lens implants   CHOLECYSTECTOMY N/A 03/24/2012   Procedure: LAPAROSCOPIC CHOLECYSTECTOMY WITH INTRAOPERATIVE CHOLANGIOGRAM;  Surgeon: Zenovia Jarred, MD;  Location: [facility];  Service: General;  Laterality: N/A;   CORONARY ARTERY BYPASS GRAFT N/A 03/06/2012   Procedure: CORONARY ARTERY BYPASS GRAFTING (CABG);  Surgeon: Rexene Alberts, MD;  Location: [place];  Service: Open Heart Surgery;  Laterality: N/A;   ENDOVEIN HARVEST OF GREATER SAPHENOUS VEIN Right 03/06/2012   Procedure: ENDOVEIN HARVEST OF GREATER SAPHENOUS VEIN;  Surgeon: Rexene Alberts, MD;  Location: [place];  Service: Open Heart Surgery;  Laterality: Right;   INTRAOPERATIVE TRANSESOPHAGEAL ECHOCARDIOGRAM N/A 03/06/2012   Procedure: INTRAOPERATIVE TRANSESOPHAGEAL ECHOCARDIOGRAM;  Surgeon: Rexene Alberts, MD;  Location: [place];  Service: Open Heart Surgery;  Laterality: N/A;   IR KYPHO LUMBAR INC FX REDUCE BONE BX UNI/BIL CANNULATION INC/IMAGING  02/23/2020   LEFT HEART CATH Right 03/02/2012   Procedure: LEFT HEART CATH;  Surgeon: Sherren Mocha, MD;  Location: [facility] C[facility] LAB;  Service: Cardiovascular;  Laterality: Right;   MAZE N/A 03/06/2012   Procedure: MAZE;  Surgeon: Rexene Alberts, MD;  Location: [place];  Service: Open Heart  Surgery;  Laterality: N/A;   PERIPHERAL VASCULAR INTERVENTION Right 10/26/2020   Procedure: PERIPHERAL VASCULAR INTERVENTION;  Surgeon: Broadus John, MD;  Location: [place] [facility];  Service: Cardiovascular;  Laterality: Right;   PERIPHERAL VASCULAR INTERVENTION  11/06/2021   Procedure: PERIPHERAL VASCULAR INTERVENTION;  Surgeon: Cherre Robins, MD;  Location: [place] [facility];   Service: Cardiovascular;;   PROSTATECTOMY     partial   RIGHT/LEFT HEART CATH AND CORONARY/GRAFT ANGIOGRAPHY N/A 09/29/2019   Procedure: RIGHT/LEFT HEART CATH AND CORONARY/GRAFT ANGIOGRAPHY;  Surgeon: Adrian Prows, MD;  Location: [place] [facility];  Service: Cardiovascular;  Laterality: N/A;   TEE WITHOUT CARDIOVERSION N/A 11/10/2019   Procedure: TRANSESOPHAGEAL ECHOCARDIOGRAM (TEE);  Surgeon: Burnell Blanks, MD;  Location: [place];  Service: Open Heart Surgery;  Laterality: N/A;   TRANSCATHETER AORTIC VALVE REPLACEMENT, TRANSFEMORAL  11/10/2019   Social History   Socioeconomic History   Marital status: Married    Spouse name: Not on file   Number of children: 4   Years of education: Not on file   Highest education level: Not on file  Occupational History   Occupation: retired    Comment: [employer]  Tobacco Use   Smoking status: Former    Packs/day: 0.50    Years: 15.00    Total pack years: 7.50    Types: Cigarettes    Quit date: 11/18/1962    Years since quitting: 59.0    Passive exposure: Never   Smokeless tobacco: Never  Vaping Use   Vaping Use: Never used  Substance and Sexual Activity   Alcohol use: Yes    Alcohol/week: 2.0 standard drinks of alcohol    Types: 2 Shots of liquor per week    Comment: daily, not in last few days   Drug use: Never   Sexual activity: Yes  Other Topics Concern   Not on file  Social History Narrative   Lives with wife in [place].     Very active physically without significant limitations.    Exercises regularly   Social Determinants of Health   Financial Resource Strain: Not on file  Food Insecurity: Not on file  Transportation Needs: Not on file  Physical Activity: Not on file  Stress: Not on file  Social Connections: Not on file   Family History  Problem Relation Age of Onset   Dementia Mother    Stroke Brother    - negative except otherwise stated in the family history section Allergies  Allergen Reactions    Amoxicillin Other (See Comments)    Headache, debilitated   Bee Venom Anaphylaxis   Avelox [Moxifloxacin] Other (See Comments)    Unknown   Codeine Other (See Comments)    Unknown    Duloxetine Other (See Comments)    Unknown   Duloxetine Hcl Other (See Comments)    felt weird   Levitra [Vardenafil] Other (See Comments)    Unknown   Morphine And Related Other (See Comments)    hypotension   Penicillin G Sodium Other (See Comments)    Severe headache, fatigue "debilitated"   Testosterone Other (See Comments)    Unknown    Tizanidine Other (See Comments)    Unknown     Viagra [Sildenafil] Other (See Comments)    didn't like it   Prior to Admission medications   Medication Sig Start Date End Date Taking? Authorizing Provider  acetaminophen (TYLENOL) 325 MG tablet Take 2 tablets (650 mg total) by mouth every 4 (four) hours  as needed for mild pain (or temp > 37.5 C (99.5 F)). Patient taking differently: Take 650 mg by mouth in the morning and at bedtime. 06/13/21  Yes Angiulli, Lavon Paganini, PA-C  amiodarone (PACERONE) 200 MG tablet Take 1 tablet (200 mg total) by mouth daily. 06/13/21  Yes Angiulli, Lavon Paganini, PA-C  apixaban (ELIQUIS) 2.5 MG TABS tablet Take 2.5 mg by mouth 2 (two) times daily.   Yes [provider]  cholecalciferol (VITAMIN D3) 25 MCG (1000 UNIT) tablet Take 1 tablet (1,000 Units total) by mouth daily. 06/13/21  Yes Angiulli, Lavon Paganini, PA-C  Cyanocobalamin (VITAMIN B-12) 5000 MCG TBDP Take 5,000 mcg by mouth daily. 06/13/21  Yes Angiulli, Lavon Paganini, PA-C  cyclobenzaprine (FLEXERIL) 5 MG tablet Take 1 tablet (5 mg total) by mouth 3 (three) times daily as needed for muscle spasms. Patient taking differently: Take 5 mg by mouth at bedtime as needed for muscle spasms. 10/19/21  Yes Kirsteins, Luanna Salk, MD  denosumab (PROLIA) 60 MG/ML SOSY injection Inject 60 mg into the skin every 6 (six) months.   Yes [provider]  docusate sodium (COLACE) 100 MG capsule Take  1 capsule (100 mg total) by mouth 2 (two) times daily. 08/23/20  Yes Hongalgi, Everlene Farrier D, MD  EPINEPHrine 0.3 mg/0.3 mL IJ SOAJ injection Inject 0.3 mg into the muscle as needed for anaphylaxis.   Yes [provider]  ezetimibe (ZETIA) 10 MG tablet Take 1 tablet (10 mg total) by mouth daily. 06/13/21  Yes Angiulli, Lavon Paganini, PA-C  finasteride (PROSCAR) 5 MG tablet Take 1 tablet (5 mg total) by mouth daily. 06/13/21  Yes Angiulli, Lavon Paganini, PA-C  fish oil-omega-3 fatty acids 1000 MG capsule Take 1 capsule (1 g total) by mouth daily. 06/13/21  Yes Angiulli, Lavon Paganini, PA-C  gabapentin (NEURONTIN) 100 MG capsule Take 2 capsules by mouth in the morning, 1 capsule in the evening, and 2 capsules at bedtime. Patient taking differently: Take 100-200 mg by mouth See admin instructions. Take 200 mg by mouth in the morning and at bedtime. Take 100 mg by mouth in the evening. 06/13/21  Yes Angiulli, Lavon Paganini, PA-C  galantamine (RAZADYNE ER) 8 MG 24 hr capsule Take 1 capsule (8 mg total) by mouth daily with breakfast. 06/13/21  Yes Angiulli, Lavon Paganini, PA-C  isosorbide mononitrate (IMDUR) 60 MG 24 hr tablet Take 1 tablet (60 mg total) by mouth daily. 06/13/21  Yes Angiulli, Lavon Paganini, PA-C  leflunomide (ARAVA) 20 MG tablet Take 1/2 tablets (10 mg total) by mouth daily. 06/13/21  Yes Angiulli, Lavon Paganini, PA-C  Magnesium 250 MG TABS Take 1 tablet (250 mg total) by mouth daily. 06/13/21  Yes Angiulli, Lavon Paganini, PA-C  memantine (NAMENDA) 10 MG tablet Take 1 tablet (10 mg total) by mouth 2 (two) times daily. 06/13/21  Yes Angiulli, Lavon Paganini, PA-C  nitroGLYCERIN (NITROSTAT) 0.4 MG SL tablet Place 1 tablet (0.4 mg total) under the tongue every 5 (five) minutes x 3 doses as needed for chest pain. 12/16/19  Yes Cantwell, Celeste C, PA-C  pantoprazole (PROTONIX) 40 MG tablet Take 40 mg by mouth daily.   Yes [provider]  polyethylene glycol (MIRALAX) 17 g packet Take 17 g by mouth daily.   Yes [provider]   potassium chloride (KLOR-CON M) 10 MEQ tablet Take 1 tablet by mouth everyday with furosemide Patient taking differently: Take 10 mEq by mouth daily. 06/13/21  Yes Angiulli, Lavon Paganini, PA-C  predniSONE (DELTASONE) 1 MG  tablet Take 3 tablets (3 mg total) by mouth daily with breakfast. 06/13/21  Yes Angiulli, Lavon Paganini, PA-C  Psyllium Husk POWD Take 1 Scoop by mouth daily.    Yes [provider]  tamsulosin (FLOMAX) 0.4 MG CAPS capsule Take 1 capsule (0.4 mg total) by mouth daily after supper. 06/13/21  Yes Angiulli, Lavon Paganini, PA-C  torsemide (DEMADEX) 20 MG tablet Take 2 tablets (40 mg total) by mouth daily. 06/13/21  Yes Angiulli, Lavon Paganini, PA-C  traMADol (ULTRAM) 50 MG tablet Take 1/2 tablets (25 mg total) by mouth every 6 (six) hours as needed for moderate pain. Patient taking differently: Take 50 mg by mouth daily as needed for moderate pain. 10/19/21  Yes Kirsteins, Luanna Salk, MD  triamcinolone cream (KENALOG) 0.1 % Apply 1 Application topically as needed (rash/itching).   Yes [provider]  vitamin C (ASCORBIC ACID) 500 MG tablet Take 1 tablet (500 mg total) by mouth daily. 06/13/21  Yes Angiulli, Lavon Paganini, PA-C  amLODipine (NORVASC) 10 MG tablet 1 tablet Orally Once a day Patient not taking: Reported on 10/19/2021    [provider]  atorvastatin (LIPITOR) 40 MG tablet Take 1 tablet (40 mg total) by mouth daily. 11/03/21   Adrian Prows, MD  collagenase Annitta Needs) 250 UNIT/GM ointment     [provider]  epoetin alfa-epbx (RETACRIT) 2000 UNIT/ML injection ? dose as directed per hematology Injection every 3 weeks. Patient not taking: Reported on 11/03/2021    [provider]  metoprolol succinate (TOPROL-XL) 25 MG 24 hr tablet     [provider]  potassium chloride (MICRO-K) 10 MEQ CR capsule TAKE ONE CAPSULE BY MOUTH EVERY DAY WITH LASIX Patient taking differently: Take 10 mEq by mouth daily. 02/16/21 06/13/21  Cantwell, Gerline Legacy, PA-C    PERIPHERAL VASCULAR CATHETERIZATION  Result Date: 11/06/2021 Table formatting from the original result was not included. Images from the original result were not included. DATE OF SERVICE: 11/06/2021  PATIENT:  [name] [name]  88 y.o. male  PRE-OPERATIVE DIAGNOSIS:  Atherosclerosis of native arteries of left lower extremity causing ulceration  POST-OPERATIVE DIAGNOSIS:  Same  PROCEDURE:  1) Ultrasound guided right common femoral artery access 2) Aortogram 3) Left lower extremity angiogram with third order cannulation (109m total contrast) 4) Left femoropopliteal angioplasty and stenting (7x430mEluvia) 5) Conscious sedation (45 minutes)  SURGEON:  ThYevonne AlineHaStanford BreedMD  ASSISTANT: none  ANESTHESIA:   local and IV sedation  ESTIMATED BLOOD LOSS: minimal  LOCAL MEDICATIONS USED:  LIDOCAINE  COUNTS: confirmed correct.  PATIENT DISPOSITION:  PACU - hemodynamically stable.  Delay start of Pharmacological VTE agent (>24hrs) due to surgical blood loss or risk of bleeding: no  INDICATION FOR PROCEDURE: DaLESHAUN BIEBELs a 89.o. male with left second toe ulceration and non-invasive evidence of peripheral arterial disease. After careful discussion of risks, benefits, and alternatives the patient was offered angiography. The patient understood and wished to proceed.  OPERATIVE FINDINGS: Terminal aorta and iliac arteries: Small juxtarenal aneurysm Tortuous Widely patent  Left lower extremity: Common femoral artery: diffuse disease, but patent. No stenosis >50%. Profunda femoris artery: diffuse disease, but patent. Superficial femoral artery: diffuse disease, but patent. >80% stenosis at Hunter's canal. Popliteal artery: >80% stenosis at Hunter's canal. Patent beyond this lesion without stenosis. Anterior tibial artery: occludes several centimeters beyond its origin Tibioperoneal trunk: diffuse disease, but patent. Peroneal artery: small, tapers to occlusion in mid calf Posterior tibial artery: dominant. Widely patent. Fills  the  foot. Pedal circulation: fills via PT. Less disease than anticipated.  GLASS score. FP 2. IP 0. Stage 1 disease.  WIfI score calculated based on clinical exam and non-invasive measurements. 2 / 1 / 1. Stage 3.  DESCRIPTION OF PROCEDURE: After identification of the patient in the pre-operative holding area, the patient was transferred to the operating room. The patient was positioned supine on the operating room table. Anesthesia was induced. The groins was prepped and draped in standard fashion. A surgical pause was performed confirming correct patient, procedure, and operative location.  The right groin was anesthetized with subcutaneous injection of 1% lidocaine. Using ultrasound guidance, the right common femoral artery was accessed with micropuncture technique. Fluoroscopy was used to confirm cannulation over the femoral head. The 624F sheath was upsized to 51F.  A Benson wire was advanced into the distal aorta. Over the wire an omni flush catheter was advanced to the level of L2. Aortogram was performed - see above for details.  The left common iliac artery was selected with an omniflush catheter and Benson guidewire. The wire was advanced into the common femoral artery. Over the wire the omni flush catheter was advanced into the external iliac artery. Selective angiography was performed - see above for details.  The decision was made to intervene. The patient was heparinized with 10,000 units of heparin. The 51F sheath was exchanged for a 24F x 45cm sheath. Selective angiography of the left lower extremity was performed prior to intervention.  The lesions were treated with: Left femoropopliteal angioplasty and stenting (7x66m Eluvia)  Completion angiography revealed: Resolution of L SFA / AKPA stenosis at Hunter's canal.  A perclose device was used to close the arteriotomy. Hemostasis was excellent upon completion.  Conscious sedation was administered with the use of IV fentanyl and midazolam under continuous  physician and nurse monitoring.  Heart rate, blood pressure, and oxygen saturation were continuously monitored.  Total sedation time was 45 minutes  Upon completion of the case instrument and sharps counts were confirmed correct. The patient was transferred to the PACU in good condition. I was present for all portions of the procedure.  PLAN: ASA '81mg'$  PO QD. High intensity statin therapy. OK to resume anticoagulation 8 hours post procedure. No need for DAPT if patient will be anticoagulated with DOAC going forward. Optimized from a vascular perspective. Can proceed with toe amputation at any time.  TYevonne Aline HStanford Breed MD [facility] CenterPhone Number: [phone]/06/2021 8:39 AM    - pertinent xrays, CT, MRI studies were reviewed and independently interpreted  Positive ROS: All other systems have been reviewed and were otherwise negative with the exception of those mentioned in the HPI and as above.  Physical Exam: General: Alert, no acute distress Psychiatric: Patient is competent for consent with normal mood and affect Lymphatic: No axillary or cervical lymphadenopathy Cardiovascular: No pedal edema Respiratory: No cyanosis, no use of accessory musculature GI: No organomegaly, abdomen is soft and non-tender    Images:  '@ENCIMAGES'$ @  Labs:  Lab Results  Component Value Date   HGBA1C 5.8 (H) 05/25/2021   HGBA1C 6.1 (H) 11/06/2019   HGBA1C 5.9 (H) 10/22/2019   ESRSEDRATE 103 (H) 11/02/2021   CRP 5.0 (H) 11/02/2021   LABURIC 5.9 10/22/2020   REPTSTATUS 11/07/2021 FINAL 11/02/2021   CULT  11/02/2021    NO GROWTH 5 DAYS [facility] [address] [place] [address]   [place](  [place]) 03/14/2021    Lab Results  Component Value Date   ALBUMIN 3.1 (L) 11/02/2021   ALBUMIN 2.7 (L) 06/01/2021   ALBUMIN 3.3 (L) 05/25/2021   PREALBUMIN 19.4 03/03/2012   LABURIC 5.9 10/22/2020        Latest Ref Rng  & Units 11/07/2021    1:37 AM 11/06/2021    2:42 AM 11/05/2021    2:27 AM  CBC EXTENDED  WBC 4.0 - 10.5 K/uL 5.2  5.2  4.5   RBC 4.22 - 5.81 MIL/uL 2.96  3.25  2.85   Hemoglobin 13.0 - 17.0 g/dL 8.6  9.2  8.1   HCT 39.0 - 52.0 % 26.9  31.2  26.4   Platelets 150 - 400 K/uL 139  145  143     Neurologic: Patient does not have protective sensation bilateral lower extremities.   MUSCULOSKELETAL:   Skin: Examination the black eschar is removed from the left foot second toe.  There is purulent drainage and exposed PIP joint.  Patient has excellent revascularization to the left lower extremity with a multiphasic dopplerable dorsalis pedis pulse.  On the right lower extremity patient has a black decubitus ulcer on the right heel 3 cm in diameter.  There is also a small ulcer over the PIP joint of the right second toe.  Ankle-brachial indices prior to revascularization on the left side showed a great toe pressure of 52 with monophasic flow.  Right-sided flow was also monophasic with a great toe pressure of 75.  Right ABI 0.65 previous ABI 1.09.  Left ABI 1.22 with a previous ABI of 1.19.  Assessment: Assessment: Peripheral vascular disease bilateral lower extremities status post endovascular revascularization of the left lower extremity with gangrene abscess osteomyelitis of the left foot second toe with a right heel decubitus ulcer and ulcer to the right foot second toe.  Plan: Plan: We will plan for a left foot second ray amputation tomorrow Wednesday.  Risk and benefits were discussed including risk of the wound not healing need for additional surgery.  Patient states he understands wished to proceed at this time.  Patient is currently in a good protective boot for the right lower extremity.   Thank you for the consult and the opportunity to see Mr. Can Lucci, [place] [phone] 10:50 AM

## 2021-11-07 NOTE — Progress Notes (Signed)
PROGRESS NOTE    [name] [name]  CWC:[id number] DOB: December 26, 1926 DOA: 11/02/2021 PCP: [name], MD    Brief Narrative:   [name] [name] is a 88 years old male with past medical history significant of hypertension, hyperlipidemia, CAD s/p CABG, PAF s/p maze on chronic anticoagulation, systolic CHF, severe AS s/p TAVR, CVA with residual deficit, PVD, PMR, AAA, OSA on CPAP presented to the hospital with worsening of feet wounds.  Patient was seen at the wound care clinic and due to worsening wound of his left second toe he was sent to the hospital for further evaluation.  Patient did have history of stroke in May 2023 and has been having difficult time  with ADLs.  In the ED, patient was bradycardic and mildly hypotensive.  WBC was 5.1.  Creatinine 1.5.  X-ray of the left foot did not show any osseous abnormality.  Chest x-ray was negative for acute findings.  Vascular surgery was also consulted and patient was admitted hospital for further evaluation and treatment.  Orthopedics was notified as well.  Assessment and Plan:  Principal Problem:   Skin ulcers of foot, bilateral (HCC) Active Problems:   Paroxysmal atrial fibrillation (HCC)   Essential hypertension   Chronic diastolic CHF (congestive heart failure) (HCC)   CAD (coronary artery disease), native coronary artery   PAD (peripheral artery disease) (HCC)   History of CVA with residual deficit   CKD (chronic kidney disease) stage 3, GFR 30-59 ml/min (HCC)   Polymyalgia rheumatica (HCC)   Dementia (HCC)   OSA (obstructive sleep apnea)   Skin ulcers of feet, bilateral, right heel pressure ulceration. Acute on chronic  ischemic ulcers..  Presenting with worsening ulceration on the left.,  Significant pressure ulcer on the right heel.  Patient was followed by wound care as outpatient.  X-ray of the foot without signs of osteomyelitis.    ESR elevated at 103, CRP 5.0.  Vascular surgery has seen the patient at this time and underwent bilateral  lower extremity angiography on 11/06/2021 with left femoropopliteal angioplasty and stenting..  Vascular surgery recommended high-dose statins and aspirin and resumption of anticoagulation 8 hours postprocedure.  Plan is to follow-up with vascular surgery in 6 weeks after discharge.  Dr Sharol Given orthopedics was consulted and recommendation is left foot second ray amputation on 11/08/2021.  PAF on chronic anticoagulation Continue heparin drip at this time.  On Eliquis as outpatient, vascular surgery recommend aspirin and DOAC on discharge.  Essential hypertension Continue metoprolol.  Blood pressure is stable at this time.   Diastolic congestive heart failure Review of 2D echocardiogram from 05/2021 with left ventricle ejection fraction of  65 -70%.  Appears to be compensated at this time.  Continue metoprolol.  Torsemide still on hold.  Patient is negative balance for 5274 ml.  CAD S/p CABG -Continue Imdur and statin   PAD Status post angioplasty of the right posterior tibial artery 10/2020.  Continue Lipitor.  Vascular surgery has seen the patient and patient has undergone bilateral lower extremity angiogram with left femoropopliteal angioplasty and stenting.Marland Kitchen   History of CVA with residual deficit Patient suffered left middle cerebral artery stroke in 05/2021 with residual right hand weakness and difficulty speaking.  Continue supportive care.  Continue statins.   CKD stage IIIb Creatinine 1.3 which appears near patient's baseline.  We will continue to monitor BMP while in the hospital.   History of rheumatoid arthritis/PMR Needing assistance with ADLs.   Wheelchair-bound currently.  On leflunomide and prednisone.  We will continue to hold leflunomide.  Continue prednisone.  Dementia Continue Namenda and galantamine    OSA -Continue CPAP at night     DVT prophylaxis: Heparin drip  Code Status:      Code Status: DNR  Disposition:  Home status  Status is: Inpatient  Remains  inpatient appropriate because: Ischemic foot ulcers, status post angiogram and angioplasty 11/06/2021, plan for ray amputation on 11/08/2021   Family Communication:  I spoke with the patient's spouse and updated her about the clinical condition of the patient on 11/04/2021.  Consultants:  Vascular surgery,  Orthopedics  Procedures:  bilateral lower extremity angiography on 11/06/2021 with left femoropopliteal angioplasty and stenting.  Antimicrobials:  None   Anti-infectives (From admission, onward)    None      Subjective:  Today, patient was seen and examined at bedside.  Pain, nausea, vomiting, fever, chills or rigor.  Objective: Vitals:   11/06/21 1248 11/06/21 1957 11/06/21 2357 11/07/21 0426  BP: (!) 141/68 (!) 121/51 (!) 116/57 133/64  Pulse: 73 66 62 68  Resp: _0 Temp: (!) 97.5 F (36.4 C) 97.9 F (36.6 C) 98.1 F (36.7 C) (!) 97.1 F (36.2 C)  TempSrc: Oral Oral Oral Axillary  SpO2: 99% 97% 98% 97%  Weight:      Height:        Intake/Output Summary (Last 24 hours) at 11/07/2021 0743 Last data filed at 11/07/2021 0430 Gross per 24 hour  Intake --  Output 1250 ml  Net -1250 ml    Filed Weights   11/02/21 1045  Weight: 98 kg    Physical Examination: Body mass index is 27.73 kg/m.   General:  Average built, not in obvious distress, alert awake and Communicative HENT:   No scleral pallor or icterus noted. Oral mucosa is moist.  Chest:  Diminished breath sounds bilaterally. No crackles or wheezes.  CVS: S1 &S2 heard. No murmur.  Regular rate and rhythm. Abdomen: Soft, nontender, nondistended.  Bowel sounds are heard.   Extremities: Bilateral lower extremity ulceration and dressing, Psych: Alert, awake and oriented, normal mood CNS:  No cranial nerve deficits.  Power equal in all extremities.   Skin: Warm and dry.  Ulceration as below Photos from 11/2 on presentation.  Right heel                right foot   Left foot  Data Reviewed:    CBC: Recent Labs  Lab 11/02/21 1212 11/03/21 0545 11/04/21 0233 11/05/21 0227 11/06/21 0242 11/07/21 0137  WBC 5.1 5.5 4.9 4.5 5.2 5.2  NEUTROABS 3.3  --   --   --   --   --   HGB 9.2* 8.5* 8.6* 8.1* 9.2* 8.6*  HCT 30.7* 26.9* 27.9* 26.4* 31.2* 26.9*  MCV 94.5 91.8 93.3 92.6 96.0 90.9  PLT 149* 129* 129* 143* 145* 139*     Basic Metabolic Panel: Recent Labs  Lab 11/02/21 1212 11/04/21 0233 11/06/21 0242 11/07/21 0137  NA 140 137 137 138  K 3.7 3.9 4.2 4.2  CL 102 106 107 108  CO2 28 24 20* 21*  GLUCOSE 98 107* 100* 99  BUN 28* _1 CREATININE 1.58* 1.35* 1.35* 1.45*  CALCIUM 10.2 9.6 9.6 9.6  MG  --  2.2  --   --      Liver Function Tests: Recent Labs  Lab 11/02/21 1212  AST 21  ALT 17  ALKPHOS 114  BILITOT 0.4  PROT 7.0  ALBUMIN 3.1*      Radiology Studies: PERIPHERAL VASCULAR CATHETERIZATION  Result Date: 11/06/2021 Table formatting from the original result was not included. Images from the original result were not included. DATE OF SERVICE: 11/06/2021  PATIENT:  [name] [name]  88 y.o. male  PRE-OPERATIVE DIAGNOSIS:  Atherosclerosis of native arteries of left lower extremity causing ulceration  POST-OPERATIVE DIAGNOSIS:  Same  PROCEDURE:  1) Ultrasound guided right common femoral artery access 2) Aortogram 3) Left lower extremity angiogram with third order cannulation (110m total contrast) 4) Left femoropopliteal angioplasty and stenting ([id number]) 5) Conscious sedation (45 minutes)  SURGEON:  ThYevonne AlineHaStanford BreedMD  ASSISTANT: none  ANESTHESIA:   local and IV sedation  ESTIMATED BLOOD LOSS: minimal  LOCAL MEDICATIONS USED:  LIDOCAINE  COUNTS: confirmed correct.  PATIENT DISPOSITION:  PACU - hemodynamically stable.  Delay start of Pharmacological VTE agent (>24hrs) due to surgical blood loss or risk of bleeding: no  INDICATION FOR PROCEDURE: [name] [name] a [age].o. male with left second toe ulceration and non-invasive evidence of peripheral  arterial disease. After careful discussion of risks, benefits, and alternatives the patient was offered angiography. The patient understood and wished to proceed.  OPERATIVE FINDINGS: Terminal aorta and iliac arteries: Small juxtarenal aneurysm Tortuous Widely patent  Left lower extremity: Common femoral artery: diffuse disease, but patent. No stenosis >50%. Profunda femoris artery: diffuse disease, but patent. Superficial femoral artery: diffuse disease, but patent. >80% stenosis at Hunter's canal. Popliteal artery: >80% stenosis at Hunter's canal. Patent beyond this lesion without stenosis. Anterior tibial artery: occludes several centimeters beyond its origin Tibioperoneal trunk: diffuse disease, but patent. Peroneal artery: small, tapers to occlusion in mid calf Posterior tibial artery: dominant. Widely patent. Fills the foot. Pedal circulation: fills via PT. Less disease than anticipated.  GLASS score. FP 2. IP 0. Stage 1 disease.  WIfI score calculated based on clinical exam and non-invasive measurements. 2 / 1 / 1. Stage 3.  DESCRIPTION OF PROCEDURE: After identification of the patient in the pre-operative holding area, the patient was transferred to the operating room. The patient was positioned supine on the operating room table. Anesthesia was induced. The groins was prepped and draped in standard fashion. A surgical pause was performed confirming correct patient, procedure, and operative location.  The right groin was anesthetized with subcutaneous injection of 1% lidocaine. Using ultrasound guidance, the right common femoral artery was accessed with micropuncture technique. Fluoroscopy was used to confirm cannulation over the femoral head. The 59F sheath was upsized to 42F.  A Benson wire was advanced into the distal aorta. Over the wire an omni flush catheter was advanced to the level of L2. Aortogram was performed - see above for details.  The left common iliac artery was selected with an omniflush  catheter and Benson guidewire. The wire was advanced into the common femoral artery. Over the wire the omni flush catheter was advanced into the external iliac artery. Selective angiography was performed - see above for details.  The decision was made to intervene. The patient was heparinized with 10,000 units of heparin. The 42F sheath was exchanged for a 1F x 45cm sheath. Selective angiography of the left lower extremity was performed prior to intervention.  The lesions were treated with: Left femoropopliteal angioplasty and stenting ([id number])  Completion angiography revealed: Resolution of L SFA / AKPA stenosis at Hunter's canal.  A perclose device was used to close the arteriotomy. Hemostasis was excellent upon completion.  Conscious sedation was administered with the use of IV fentanyl and midazolam under continuous physician and nurse monitoring.  Heart rate, blood pressure, and oxygen saturation were continuously monitored.  Total sedation time was 45 minutes  Upon completion of the case instrument and sharps counts were confirmed correct. The patient was transferred to the PACU in good condition. I was present for all portions of the procedure.  PLAN: ASA 46m PO QD. High intensity statin therapy. OK to resume anticoagulation 8 hours post procedure. No need for DAPT if patient will be anticoagulated with DOAC going forward. Optimized from a vascular perspective. Can proceed with toe amputation at any time.  TYevonne Aline HStanford Breed MD [facility] HospitalPhone Number: ([phone]/06/2021 8:39 AM       LOS: 5 days    LFlora Lipps MD [facility] Available via Epic secure chat 7am-7pm After these hours, please refer to coverage provider listed on [email or web address] 11/07/2021, 7:43 AM

## 2021-11-07 NOTE — Progress Notes (Signed)
ANTICOAGULATION CONSULT NOTE - Follow Up Consult  Pharmacy Consult for Heparin Indication: atrial fibrillation  Allergies  Allergen Reactions   Amoxicillin Other (See Comments)    Headache, debilitated   Bee Venom Anaphylaxis   Avelox [Moxifloxacin] Other (See Comments)    Unknown   Codeine Other (See Comments)    Unknown    Duloxetine Other (See Comments)    Unknown   Duloxetine Hcl Other (See Comments)    felt weird   Levitra [Vardenafil] Other (See Comments)    Unknown   Morphine And Related Other (See Comments)    hypotension   Penicillin G Sodium Other (See Comments)    Severe headache, fatigue "debilitated"   Testosterone Other (See Comments)    Unknown    Tizanidine Other (See Comments)    Unknown     Viagra [Sildenafil] Other (See Comments)    didn't like it    Patient Measurements: Height: '6\' 2"'$  (188 cm) Weight: 98 kg (216 lb) IBW/kg (Calculated) : 82.2 Heparin Dosing Weight: 98 kg  Vital Signs: Temp: 97.9 F (36.6 C) (11/07 0826) Temp Source: Oral (11/07 0826) BP: 139/51 (11/07 0826) Pulse Rate: 69 (11/07 0826)  Labs: Recent Labs    11/05/21 0227 11/05/21 0925 11/05/21 1809 11/06/21 0242 11/07/21 0137 11/07/21 0848  HGB 8.1*  --   --  9.2* 8.6*  --   HCT 26.4*  --   --  31.2* 26.9*  --   PLT 143*  --   --  145* 139*  --   APTT 117* 94* 89*  --   --   --   HEPARINUNFRC 0.59 0.62 0.62 0.54 0.35 0.45  CREATININE  --   --   --  1.35* 1.45*  --      Estimated Creatinine Clearance: 37 mL/min (A) (by C-G formula based on SCr of 1.45 mg/dL (H)).   Medications:  Scheduled:   amiodarone  200 mg Oral Daily   ascorbic acid  500 mg Oral Daily   aspirin EC  81 mg Oral Daily   atorvastatin  40 mg Oral Daily   cholecalciferol  1,000 Units Oral Daily   vitamin B-12  1,000 mcg Oral Daily   docusate sodium  100 mg Oral BID   ezetimibe  10 mg Oral Daily   finasteride  5 mg Oral Daily   gabapentin  200 mg Oral BID   galantamine  8 mg Oral Q  breakfast   isosorbide mononitrate  60 mg Oral Daily   memantine  10 mg Oral BID   metoprolol succinate  25 mg Oral Daily   pantoprazole  40 mg Oral Daily   polyethylene glycol  17 g Oral Daily   potassium chloride  10 mEq Oral Daily   predniSONE  3 mg Oral Q breakfast   psyllium  1 packet Oral Daily   sodium chloride flush  3 mL Intravenous Q12H   sodium chloride flush  3 mL Intravenous Q12H   tamsulosin  0.4 mg Oral QPC supper   Infusions:   sodium chloride 100 mL/hr at 11/06/21 3893   sodium chloride     heparin 1,250 Units/hr (11/06/21 1715)    Assessment: 59 yoM with a history of HTN, HLD, CAD s/p CABG, AF s/p maze on apixaban, HF, s/p TAVR, CVA, PVD, AAA, OSA on CPAP. Patient is presenting with wound infection. Last dose of Eliquis 11/2 AM. Pharmacy consulted to dose heparin. Pt is s/p LLE angiogram w/ L fem-pop angioplasty + stenting  on 11/6.  Heparin level = 0.45, therapeutic on heparin 1250u/hr Hgb 8.6, Plt 139 - stable, post-op No s/sx of bleeding noted post-op on heparin infusion.  Goal of Therapy:  Heparin level 0.3-0.7 units/ml aPTT 66-102 seconds Monitor platelets by anticoagulation protocol: Yes   Plan:  Continue heparin infusion at 1250 units/hr  Monitor daily CBC, heparin level, and for s/sx of bleeding. Planning to proceed with L toe amputation soon F/u on surgical plans with orthopedic surgery and transition back to apixaban. Per Dr. Mora Appl post-op note on 11/6, pt will not require DAPT if anticoagulated with DOAC going forward.   Thank you for allowing [place] to participate in this patients care.  Luisa Hart, PharmD, BCPS Clinical Pharmacist 11/07/2021 10:21 AM   Please refer to AMION for pharmacy phone number

## 2021-11-07 NOTE — Progress Notes (Signed)
PHARMACIST LIPID MONITORING   [name] [name] is a 88 y.o. male admitted on 11/02/2021 with atheroclerosis of native arteries of LLE causing ulceration. Pt is s/p LLE angio w/ L fem-pop angioplasty and stenting on 11/6.  Pharmacy has been consulted to optimize lipid-lowering therapy with the indication of secondary prevention for clinical ASCVD.  Recent Labs:  Lipid Panel (last 6 months):   Lab Results  Component Value Date   CHOL 116 11/07/2021   TRIG 87 11/07/2021   HDL 51 11/07/2021   CHOLHDL 2.3 11/07/2021   VLDL 17 11/07/2021   LDLCALC 48 11/07/2021    Hepatic function panel (last 6 months):   Lab Results  Component Value Date   AST 21 11/02/2021   ALT 17 11/02/2021   ALKPHOS 114 11/02/2021   BILITOT 0.4 11/02/2021    SCr (since admission):   Serum creatinine: 1.45 mg/dL (H) 11/07/21 0137 Estimated creatinine clearance: 37 mL/min (A)  Current therapy and lipid therapy tolerance Current lipid-lowering therapy: atorvastatin '40mg'$  + ezetimibe '10mg'$   Previous lipid-lowering therapies (if applicable):  Started atorvastatin 40 on 10/26/20 Rosuvastatin '10mg'$  06/25/12 -06/29/20 Rosuvastatin '20mg'$  03/25/12-06/24/12 Documented or reported allergies or intolerances to lipid-lowering therapies (if applicable): stopped statin, ezetimibe, and allopurinol in June 2022 due to concerns for fatigue and deconditioning - no specific concerns for statin-related causes provided.  Pt also evaluated at this time for concerns of HF or AFib causing symptoms.  Assessment:   No changes indicate for lipid lowering therapy at this time.  Plan:    1.Statin intensity (high intensity recommended for all patients regardless of the LDL):  No statin changes. The patient is already on a high intensity statin.  2.Add ezetimibe (if any one of the following):   Patient is already taking ezetimibe '10mg'$  daily. No changes needed.  3.Refer to lipid clinic:   No  4.Follow-up with:  Primary care provider - [name], MD  5.Follow-up labs after discharge:  No changes in lipid therapy, repeat a lipid panel in one year.      Luisa Hart, PharmD, BCPS Clinical Pharmacist 11/07/2021 8:05 AM   Please refer to AMION for pharmacy phone number

## 2021-11-07 NOTE — Progress Notes (Signed)
Pt. Places himself on home CPAP.

## 2021-11-08 ENCOUNTER — Other Ambulatory Visit: Payer: Self-pay

## 2021-11-08 ENCOUNTER — Encounter (HOSPITAL_COMMUNITY)
Admission: EM | Disposition: A | Discharge status: Skilled Nursing Facility | Payer: Self-pay | Source: Ambulatory Visit | Attending: Internal Medicine

## 2021-11-08 ENCOUNTER — Inpatient Hospital Stay (HOSPITAL_COMMUNITY): Payer: Medicare Other | Admitting: Anesthesiology

## 2021-11-08 ENCOUNTER — Encounter (HOSPITAL_COMMUNITY): Payer: Self-pay | Admitting: Internal Medicine

## 2021-11-08 DIAGNOSIS — I96 Gangrene, not elsewhere classified: Secondary | ICD-10-CM

## 2021-11-08 DIAGNOSIS — I252 Old myocardial infarction: Secondary | ICD-10-CM

## 2021-11-08 DIAGNOSIS — M869 Osteomyelitis, unspecified: Secondary | ICD-10-CM

## 2021-11-08 DIAGNOSIS — Z87891 Personal history of nicotine dependence: Secondary | ICD-10-CM

## 2021-11-08 DIAGNOSIS — I110 Hypertensive heart disease with heart failure: Secondary | ICD-10-CM

## 2021-11-08 DIAGNOSIS — I509 Heart failure, unspecified: Secondary | ICD-10-CM

## 2021-11-08 DIAGNOSIS — I25119 Atherosclerotic heart disease of native coronary artery with unspecified angina pectoris: Secondary | ICD-10-CM

## 2021-11-08 DIAGNOSIS — L97529 Non-pressure chronic ulcer of other part of left foot with unspecified severity: Secondary | ICD-10-CM | POA: Diagnosis not present

## 2021-11-08 DIAGNOSIS — L97519 Non-pressure chronic ulcer of other part of right foot with unspecified severity: Secondary | ICD-10-CM | POA: Diagnosis not present

## 2021-11-08 HISTORY — PX: AMPUTATION: SHX166

## 2021-11-08 LAB — CBC
HCT: 26.5 % — ABNORMAL LOW (ref 39.0–52.0)
Hemoglobin: 8.5 g/dL — ABNORMAL LOW (ref 13.0–17.0)
MCH: 29.3 pg (ref 26.0–34.0)
MCHC: 32.1 g/dL (ref 30.0–36.0)
MCV: 91.4 fL (ref 80.0–100.0)
Platelets: 135 10*3/uL — ABNORMAL LOW (ref 150–400)
RBC: 2.9 MIL/uL — ABNORMAL LOW (ref 4.22–5.81)
RDW: 16.1 % — ABNORMAL HIGH (ref 11.5–15.5)
WBC: 5.7 10*3/uL (ref 4.0–10.5)
nRBC: 0 % (ref 0.0–0.2)

## 2021-11-08 LAB — URINALYSIS, ROUTINE W REFLEX MICROSCOPIC
Bilirubin Urine: NEGATIVE
Glucose, UA: NEGATIVE mg/dL
Hgb urine dipstick: NEGATIVE
Ketones, ur: NEGATIVE mg/dL
Nitrite: POSITIVE — AB
Protein, ur: NEGATIVE mg/dL
Specific Gravity, Urine: 1.011 (ref 1.005–1.030)
WBC, UA: >50 WBC/hpf — ABNORMAL HIGH (ref 0–5)
pH: 6 (ref 5.0–8.0)

## 2021-11-08 LAB — BASIC METABOLIC PANEL
Anion gap: 8 (ref 5–15)
BUN: 17 mg/dL (ref 8–23)
CO2: 20 mmol/L — ABNORMAL LOW (ref 22–32)
Calcium: 9.6 mg/dL (ref 8.9–10.3)
Chloride: 108 mmol/L (ref 98–111)
Creatinine, Ser: 1.53 mg/dL — ABNORMAL HIGH (ref 0.61–1.24)
GFR, Estimated: 42 mL/min — ABNORMAL LOW (ref >60)
Glucose, Bld: 106 mg/dL — ABNORMAL HIGH (ref 70–99)
Potassium: 4 mmol/L (ref 3.5–5.1)
Sodium: 136 mmol/L (ref 135–145)

## 2021-11-08 LAB — HEPARIN LEVEL (UNFRACTIONATED): Heparin Unfractionated: 0.4 IU/mL (ref 0.30–0.70)

## 2021-11-08 SURGERY — AMPUTATION, FOOT, RAY
Anesthesia: General | Laterality: Left

## 2021-11-08 MED ORDER — GUAIFENESIN-DM 100-10 MG/5ML PO SYRP: 15.0000 mL | ORAL_SOLUTION | ORAL | Status: DC | PRN

## 2021-11-08 MED ORDER — DOCUSATE SODIUM 100 MG PO CAPS: 100.0000 mg | ORAL_CAPSULE | Freq: Every day | ORAL | Status: DC

## 2021-11-08 MED ORDER — LACTATED RINGERS IV SOLN: INTRAVENOUS | Status: DC

## 2021-11-08 MED ORDER — OXYCODONE HCL 5 MG PO TABS: 5.0000 mg | ORAL_TABLET | Freq: Once | ORAL | Status: DC | PRN

## 2021-11-08 MED ORDER — MAGNESIUM SULFATE 2 GM/50ML IV SOLN: 2.0000 g | Freq: Every day | INTRAVENOUS | Status: DC | PRN

## 2021-11-08 MED ORDER — SODIUM CHLORIDE 0.9 % IV SOLN: INTRAVENOUS | Status: DC

## 2021-11-08 MED ORDER — PANTOPRAZOLE SODIUM 40 MG PO TBEC: 40.0000 mg | DELAYED_RELEASE_TABLET | Freq: Every day | ORAL | Status: DC

## 2021-11-08 MED ORDER — LIDOCAINE HCL (PF) 1 % IJ SOLN
INTRAMUSCULAR | Status: AC
  Filled 2021-11-08: qty 30

## 2021-11-08 MED ORDER — HYDROCODONE-ACETAMINOPHEN 7.5-325 MG PO TABS: 1.0000 | ORAL_TABLET | ORAL | Status: DC | PRN

## 2021-11-08 MED ORDER — ONDANSETRON HCL 4 MG/2ML IJ SOLN: 4.0000 mg | Freq: Four times a day (QID) | INTRAMUSCULAR | Status: DC | PRN

## 2021-11-08 MED ORDER — LIDOCAINE 5 % EX PTCH
1.0000 | MEDICATED_PATCH | CUTANEOUS | Status: DC
  Administered 2021-11-09 – 2021-11-14 (×6): 1 via TRANSDERMAL
  Filled 2021-11-08 (×6): qty 1

## 2021-11-08 MED ORDER — JUVEN PO PACK
1.0000 | PACK | Freq: Two times a day (BID) | ORAL | Status: DC
  Administered 2021-11-09 – 2021-11-14 (×11): 1 via ORAL
  Filled 2021-11-08 (×12): qty 1

## 2021-11-08 MED ORDER — MAGNESIUM CITRATE PO SOLN: 1.0000 | Freq: Once | ORAL | Status: DC | PRN

## 2021-11-08 MED ORDER — FENTANYL CITRATE (PF) 100 MCG/2ML IJ SOLN: 25.0000 ug | INTRAMUSCULAR | Status: DC | PRN

## 2021-11-08 MED ORDER — MEPERIDINE HCL 25 MG/ML IJ SOLN: 6.2500 mg | INTRAMUSCULAR | Status: DC | PRN

## 2021-11-08 MED ORDER — LABETALOL HCL 5 MG/ML IV SOLN: 10.0000 mg | INTRAVENOUS | Status: DC | PRN

## 2021-11-08 MED ORDER — ONDANSETRON HCL 4 MG/2ML IJ SOLN: 4.0000 mg | Freq: Once | INTRAMUSCULAR | Status: DC | PRN

## 2021-11-08 MED ORDER — METOPROLOL TARTRATE 5 MG/5ML IV SOLN: 2.0000 mg | INTRAVENOUS | Status: DC | PRN

## 2021-11-08 MED ORDER — 0.9 % SODIUM CHLORIDE (POUR BTL) OPTIME
TOPICAL | Status: DC | PRN
  Administered 2021-11-08: 1000 mL

## 2021-11-08 MED ORDER — POLYETHYLENE GLYCOL 3350 17 G PO PACK: 17.0000 g | PACK | Freq: Every day | ORAL | Status: DC | PRN

## 2021-11-08 MED ORDER — CEFAZOLIN SODIUM-DEXTROSE 2-4 GM/100ML-% IV SOLN
2.0000 g | Freq: Three times a day (TID) | INTRAVENOUS | Status: AC
  Administered 2021-11-09 (×2): 2 g via INTRAVENOUS
  Filled 2021-11-08 (×2): qty 100

## 2021-11-08 MED ORDER — CEFAZOLIN SODIUM-DEXTROSE 2-4 GM/100ML-% IV SOLN
2.0000 g | INTRAVENOUS | Status: AC
  Administered 2021-11-08: 2 g via INTRAVENOUS
  Filled 2021-11-08: qty 100

## 2021-11-08 MED ORDER — VITAMIN C 500 MG PO TABS
1000.0000 mg | ORAL_TABLET | Freq: Every day | ORAL | Status: DC
  Administered 2021-11-08 – 2021-11-14 (×7): 1000 mg via ORAL
  Filled 2021-11-08 (×7): qty 2

## 2021-11-08 MED ORDER — POVIDONE-IODINE 10 % EX SWAB
2.0000 | Freq: Once | CUTANEOUS | Status: AC
  Administered 2021-11-08: 2 via TOPICAL

## 2021-11-08 MED ORDER — PROPOFOL 10 MG/ML IV BOLUS
INTRAVENOUS | Status: DC | PRN
  Administered 2021-11-08: 10 mg via INTRAVENOUS

## 2021-11-08 MED ORDER — PROPOFOL 10 MG/ML IV BOLUS
INTRAVENOUS | Status: AC
  Filled 2021-11-08: qty 20

## 2021-11-08 MED ORDER — ALUM & MAG HYDROXIDE-SIMETH 200-200-20 MG/5ML PO SUSP: 15.0000 mL | ORAL | Status: DC | PRN

## 2021-11-08 MED ORDER — ACETAMINOPHEN 160 MG/5ML PO SOLN: 325.0000 mg | ORAL | Status: DC | PRN

## 2021-11-08 MED ORDER — POTASSIUM CHLORIDE CRYS ER 20 MEQ PO TBCR: 20.0000 meq | EXTENDED_RELEASE_TABLET | Freq: Every day | ORAL | Status: DC | PRN

## 2021-11-08 MED ORDER — HYDRALAZINE HCL 20 MG/ML IJ SOLN: 5.0000 mg | INTRAMUSCULAR | Status: DC | PRN

## 2021-11-08 MED ORDER — HYDROCODONE-ACETAMINOPHEN 5-325 MG PO TABS: 1.0000 | ORAL_TABLET | ORAL | Status: DC | PRN

## 2021-11-08 MED ORDER — ACETAMINOPHEN 325 MG PO TABS: 325.0000 mg | ORAL_TABLET | Freq: Four times a day (QID) | ORAL | Status: DC | PRN

## 2021-11-08 MED ORDER — CHLORHEXIDINE GLUCONATE 0.12 % MT SOLN: 15.0000 mL | Freq: Once | OROMUCOSAL | Status: AC

## 2021-11-08 MED ORDER — LIDOCAINE HCL (PF) 1 % IJ SOLN
INTRAMUSCULAR | Status: DC | PRN
  Administered 2021-11-08: 20 mL

## 2021-11-08 MED ORDER — BISACODYL 5 MG PO TBEC: 5.0000 mg | DELAYED_RELEASE_TABLET | Freq: Every day | ORAL | Status: DC | PRN

## 2021-11-08 MED ORDER — ACETAMINOPHEN 325 MG PO TABS: 325.0000 mg | ORAL_TABLET | ORAL | Status: DC | PRN

## 2021-11-08 MED ORDER — ORAL CARE MOUTH RINSE: 15.0000 mL | Freq: Once | OROMUCOSAL | Status: AC

## 2021-11-08 MED ORDER — CHLORHEXIDINE GLUCONATE 0.12 % MT SOLN
OROMUCOSAL | Status: AC
  Administered 2021-11-08: 15 mL via OROMUCOSAL
  Filled 2021-11-08: qty 15

## 2021-11-08 MED ORDER — ZINC SULFATE 220 (50 ZN) MG PO CAPS
220.0000 mg | ORAL_CAPSULE | Freq: Every day | ORAL | Status: DC
  Administered 2021-11-08 – 2021-11-14 (×7): 220 mg via ORAL
  Filled 2021-11-08 (×7): qty 1

## 2021-11-08 MED ORDER — PROPOFOL 500 MG/50ML IV EMUL
INTRAVENOUS | Status: DC | PRN
  Administered 2021-11-08: 50 ug/kg/min via INTRAVENOUS

## 2021-11-08 MED ORDER — OXYCODONE HCL 5 MG/5ML PO SOLN: 5.0000 mg | Freq: Once | ORAL | Status: DC | PRN

## 2021-11-08 MED ORDER — PHENOL 1.4 % MT LIQD: 1.0000 | OROMUCOSAL | Status: DC | PRN

## 2021-11-08 SURGICAL SUPPLY — 31 items
BAG COUNTER SPONGE SURGICOUNT (BAG) ×2 IMPLANT
BAG SPNG CNTER NS LX DISP (BAG) ×1
BLADE SAW SGTL MED 73X18.5 STR (BLADE) IMPLANT
BLADE SURG 21 STRL SS (BLADE) ×2 IMPLANT
BNDG CMPR 5X4 CHSV STRCH STRL (GAUZE/BANDAGES/DRESSINGS) ×1
BNDG COHESIVE 4X5 TAN STRL (GAUZE/BANDAGES/DRESSINGS) ×2 IMPLANT
BNDG COHESIVE 4X5 TAN STRL LF (GAUZE/BANDAGES/DRESSINGS) IMPLANT
BNDG GAUZE DERMACEA FLUFF 4 (GAUZE/BANDAGES/DRESSINGS) ×2 IMPLANT
BNDG GZE DERMACEA 4 6PLY (GAUZE/BANDAGES/DRESSINGS) ×1
COVER SURGICAL LIGHT HANDLE (MISCELLANEOUS) ×4 IMPLANT
DRAPE U-SHAPE 47X51 STRL (DRAPES) ×4 IMPLANT
DRSG ADAPTIC 3X8 NADH LF (GAUZE/BANDAGES/DRESSINGS) ×2 IMPLANT
DURAPREP 26ML APPLICATOR (WOUND CARE) ×2 IMPLANT
ELECT REM PT RETURN 9FT ADLT (ELECTROSURGICAL) ×1
ELECTRODE REM PT RTRN 9FT ADLT (ELECTROSURGICAL) ×2 IMPLANT
GAUZE PAD ABD 8X10 STRL (GAUZE/BANDAGES/DRESSINGS) ×4 IMPLANT
GAUZE SPONGE 4X4 12PLY STRL (GAUZE/BANDAGES/DRESSINGS) ×2 IMPLANT
GLOVE BIOGEL PI IND STRL 9 (GLOVE) ×2 IMPLANT
GLOVE SURG ORTHO 9.0 STRL STRW (GLOVE) ×2 IMPLANT
GOWN STRL REUS W/ TWL XL LVL3 (GOWN DISPOSABLE) ×4 IMPLANT
GOWN STRL REUS W/TWL XL LVL3 (GOWN DISPOSABLE) ×2
KIT BASIN OR (CUSTOM PROCEDURE TRAY) ×2 IMPLANT
KIT TURNOVER KIT B (KITS) ×2 IMPLANT
NS IRRIG 1000ML POUR BTL (IV SOLUTION) ×2 IMPLANT
PACK ORTHO EXTREMITY (CUSTOM PROCEDURE TRAY) ×2 IMPLANT
PAD ARMBOARD 7.5X6 YLW CONV (MISCELLANEOUS) ×4 IMPLANT
STOCKINETTE IMPERVIOUS LG (DRAPES) IMPLANT
SUT ETHILON 2 0 PSLX (SUTURE) ×2 IMPLANT
TOWEL GREEN STERILE (TOWEL DISPOSABLE) ×2 IMPLANT
TUBE CONNECTING 12X1/4 (SUCTIONS) ×2 IMPLANT
YANKAUER SUCT BULB TIP NO VENT (SUCTIONS) ×2 IMPLANT

## 2021-11-08 NOTE — Op Note (Signed)
11/02/2021 - 11/08/2021  6:34 PM  PATIENT:  [name] [name]    PRE-OPERATIVE DIAGNOSIS:  Osteomyelitis Left 2nd Toe  POST-OPERATIVE DIAGNOSIS:  Same  PROCEDURE:  LEFT FOOT 2ND RAY AMPUTATION  SURGEON:  [name], MD  PHYSICIAN ASSISTANT:None ANESTHESIA:   General  PREOPERATIVE INDICATIONS:  [name] is a  88 y.o. male with a diagnosis of Osteomyelitis Left 2nd Toe who failed conservative measures and elected for surgical management.    The risks benefits and alternatives were discussed with the patient preoperatively including but not limited to the risks of infection, bleeding, nerve injury, cardiopulmonary complications, the need for revision surgery, among others, and the patient was willing to proceed.  OPERATIVE IMPLANTS: None  _0 @  OPERATIVE FINDINGS: Minimal petechial bleeding there was abscess at the PIP joint.  No signs of infection at the amputation level.  OPERATIVE PROCEDURE: Patient was brought the operating room and underwent a MAC anesthetic.  After adequate levels anesthesia were obtained patient's left lower extremity was prepped using DuraPrep draped into a sterile field a timeout was called.  Patient underwent a digital block with 20 cc of 1% lidocaine plain.  A V incision was made around the second ray the second ray was amputated through the metatarsal neck shaft.  This was resected in 1 block of tissue.  The wound was irrigated with normal saline electrocautery was used for hemostasis there was minimal petechial bleeding.  The incision was closed using 2-0 nylon a sterile dressing was applied patient was taken the PACU in stable condition.   DISCHARGE PLANNING:  Antibiotic duration: Continue antibiotics for 24 hours  Weightbearing: Nonweightbearing on the left  Pain medication: Prescription for Vicodin  Dressing care/ Wound VAC: Dry dressing reinforce as needed  Ambulatory devices: Walker or wheelchair.  Discharge to: Discharge planning  based on therapy recommendations.  Follow-up: In the office 1 week post operative.

## 2021-11-08 NOTE — Interval H&P Note (Signed)
History and Physical Interval Note:  11/08/2021 7:07 AM  [name] [name]  has presented today for surgery, with the diagnosis of Osteomyelitis Left 2nd Toe.  The various methods of treatment have been discussed with the patient and family. After consideration of risks, benefits and other options for treatment, the patient has consented to  Procedure(s): LEFT FOOT 2ND RAY AMPUTATION (Left) as a surgical intervention.  The patient's history has been reviewed, patient examined, no change in status, stable for surgery.  I have reviewed the patient's chart and labs.  Questions were answered to the patient's satisfaction.     [name]

## 2021-11-08 NOTE — Progress Notes (Signed)
ANTICOAGULATION CONSULT NOTE - Follow Up Consult  Pharmacy Consult for Heparin Indication: atrial fibrillation  Allergies  Allergen Reactions   Amoxicillin Other (See Comments)    Headache, debilitated   Bee Venom Anaphylaxis   Avelox [Moxifloxacin] Other (See Comments)    Unknown   Codeine Other (See Comments)    Unknown    Duloxetine Other (See Comments)    Unknown   Duloxetine Hcl Other (See Comments)    felt weird   Levitra [Vardenafil] Other (See Comments)    Unknown   Morphine And Related Other (See Comments)    hypotension   Penicillin G Sodium Other (See Comments)    Severe headache, fatigue "debilitated"   Testosterone Other (See Comments)    Unknown    Tizanidine Other (See Comments)    Unknown     Viagra [Sildenafil] Other (See Comments)    didn't like it    Patient Measurements: Height: '6\' 2"'$  (188 cm) Weight: 98 kg (216 lb) IBW/kg (Calculated) : 82.2 Heparin Dosing Weight: 98 kg  Vital Signs: Temp: 98.3 F (36.8 C) (11/08 0731) Temp Source: Oral (11/08 0731) BP: 144/62 (11/08 0731) Pulse Rate: 64 (11/08 0731)  Labs: Recent Labs    11/05/21 0925 11/05/21 1809 11/05/21 1809 11/06/21 0242 11/07/21 0137 11/07/21 0848 11/08/21 0206  HGB  --   --    < > 9.2* 8.6*  --  8.5*  HCT  --   --   --  31.2* 26.9*  --  26.5*  PLT  --   --   --  145* 139*  --  135*  APTT 94* 89*  --   --   --   --   --   HEPARINUNFRC 0.62 0.62  --  0.54 0.35 0.45 0.40  CREATININE  --   --   --  1.35* 1.45*  --  1.53*   < > = values in this interval not displayed.     Estimated Creatinine Clearance: 35.1 mL/min (A) (by C-G formula based on SCr of 1.53 mg/dL (H)).   Medications:  Scheduled:   amiodarone  200 mg Oral Daily   ascorbic acid  500 mg Oral Daily   aspirin EC  81 mg Oral Daily   atorvastatin  40 mg Oral Daily   chlorhexidine  60 mL Topical Once   cholecalciferol  1,000 Units Oral Daily   vitamin B-12  1,000 mcg Oral Daily   docusate sodium  100 mg Oral  BID   ezetimibe  10 mg Oral Daily   finasteride  5 mg Oral Daily   gabapentin  200 mg Oral BID   galantamine  8 mg Oral Q breakfast   isosorbide mononitrate  60 mg Oral Daily   memantine  10 mg Oral BID   metoprolol succinate  25 mg Oral Daily   pantoprazole  40 mg Oral Daily   polyethylene glycol  17 g Oral Daily   potassium chloride  10 mEq Oral Daily   predniSONE  3 mg Oral Q breakfast   psyllium  1 packet Oral Daily   sodium chloride flush  3 mL Intravenous Q12H   sodium chloride flush  3 mL Intravenous Q12H   tamsulosin  0.4 mg Oral QPC supper   Infusions:   sodium chloride 100 mL/hr at 11/06/21 8588   sodium chloride     heparin 1,250 Units/hr (11/07/21 1327)    Assessment: 82 yoM with a history of HTN, HLD, CAD s/p CABG, AF s/p  maze on apixaban, HF, s/p TAVR, CVA, PVD, AAA, OSA on CPAP. Patient is presenting with wound infection. Last dose of Eliquis 11/2 AM. Pharmacy consulted to dose heparin. Pt is s/p LLE angiogram w/ L fem-pop angioplasty + stenting on 11/6.  Heparin level = 0.40, remains therapeutic on heparin 1250u/hr Hgb 8.5, Plt 135 - stable No s/sx of bleeding noted.  Goal of Therapy:  Heparin level 0.3-0.7 units/ml aPTT 66-102 seconds Monitor platelets by anticoagulation protocol: Yes   Plan:  Continue heparin infusion at 1250 units/hr until 0900.  Discontinue heparin infusion at 0900 for L 2nd toe amputation in OR later today. Monitor daily CBC, heparin level, and for s/sx of bleeding. F/u with orthopedic surgery post-op on anticoag plan and transition back to apixaban.  Per Dr. Mora Appl post-op note on 11/6, pt will not require DAPT if anticoagulated with DOAC going forward.  Thank you for allowing [place] to participate in this patients care.  Luisa Hart, PharmD, BCPS Clinical Pharmacist 11/08/2021 8:39 AM   Please refer to AMION for pharmacy phone number

## 2021-11-08 NOTE — Interval H&P Note (Signed)
History and Physical Interval Note:  11/08/2021 6:57 AM  [name] [name]  has presented today for surgery, with the diagnosis of Osteomyelitis Left 2nd Toe.  The various methods of treatment have been discussed with the patient and family. After consideration of risks, benefits and other options for treatment, the patient has consented to  Procedure(s): LEFT FOOT 2ND RAY AMPUTATION (Left) as a surgical intervention.  The patient's history has been reviewed, patient examined, no change in status, stable for surgery.  I have reviewed the patient's chart and labs.  Questions were answered to the patient's satisfaction.     [name]

## 2021-11-08 NOTE — Anesthesia Preprocedure Evaluation (Addendum)
Anesthesia Evaluation  Patient identified by MRN, date of birth, ID band Patient awake    Reviewed: Allergy & Precautions, H&P , NPO status , Patient's Chart, lab work & pertinent test results  Airway Mallampati: II   Neck ROM: full    Dental  (+) Teeth Intact, Dental Advisory Given   Pulmonary shortness of breath, sleep apnea , former smoker   breath sounds clear to auscultation       Cardiovascular hypertension, Pt. on medications + angina  + CAD, + Past MI, + CABG, + Peripheral Vascular Disease and +CHF  + dysrhythmias + Valvular Problems/Murmurs AS  Rhythm:regular Rate:Normal     Neuro/Psych  Headaches PSYCHIATRIC DISORDERS Anxiety    Dementia CVA    GI/Hepatic ,GERD  ,,  Endo/Other    Renal/GU Renal disease     Musculoskeletal  (+) Arthritis ,    Abdominal   Peds  Hematology  (+) Blood dyscrasia, anemia   Anesthesia Other Findings   Reproductive/Obstetrics                             Anesthesia Physical Anesthesia Plan  ASA: 4  Anesthesia Plan: MAC   Post-op Pain Management: Minimal or no pain anticipated   Induction: Intravenous  PONV Risk Score and Plan: 2 and Ondansetron, Dexamethasone and Treatment may vary due to age or medical condition  Airway Management Planned: Simple Face Mask and Nasal Cannula  Additional Equipment: None  Intra-op Plan:   Post-operative Plan:   Informed Consent: I have reviewed the patients History and Physical, chart, labs and discussed the procedure including the risks, benefits and alternatives for the proposed anesthesia with the patient or authorized representative who has indicated his/her understanding and acceptance.       Plan Discussed with: CRNA, Anesthesiologist and Surgeon  Anesthesia Plan Comments: ( )        Anesthesia Quick Evaluation

## 2021-11-08 NOTE — Care Management Important Message (Signed)
Important Message  Patient Details  Name: [name] [name] MRN: [id number] Date of Birth: [birth year]   Medicare Important Message Given:  Yes     Shelda Altes 11/08/2021, 10:48 AM

## 2021-11-08 NOTE — Consult Note (Signed)
   [facility] CM Inpatient Consult   11/08/2021  [name] March 06, 1926 [id number]  [place] [facility] [ACO] Patient: [name] [name] [facility] Medicare  Primary Care Provider:  [name], MD, is listed to provide the transition of care follow up  Patient screened for hospitalization with noted extreme high risk score for unplanned readmission risk and  to assess for potential [place] Management service needs for post hospital transition for care coordination.  Review of patient's electronic medical record reveals patient is fresh post op.  2:05 pm on rounds fresh post op sleeping ib rounds  Plan:  Continue to follow progress and disposition to assess for post hospital [facility] community care coordination/management needs.  Referral request for community care coordination can be made: following for disposition.  Of note, [facility] Care Management/Population Health does not replace or interfere with any arrangements made by the Inpatient Transition of Care team.  For questions contact:   Natividad Brood, RN BSN [place]  [phone] business mobile phone Toll free office [phone]  *[place]  [phone] Fax number: [phone] [email or web address]

## 2021-11-08 NOTE — Progress Notes (Addendum)
PROGRESS NOTE    [name] [name]  DDU:[id number] DOB: [birth year] DOA: 11/02/2021 PCP: [name] Infante, MD     Brief Narrative:   [name] [name] is a 88 years old male with past medical history significant of hypertension, hyperlipidemia, CAD s/p CABG, PAF s/p maze on chronic anticoagulation, systolic CHF, severe AS s/p TAVR, CVA with residual deficit, PVD, PMR, AAA, OSA on CPAP presented to [name] with worsening of feet wounds.   Assessment and Plan:   PAD with Skin ulcers of feet, bilateral, right heel pressure ulceration. -x ray no osteomyelitis -blood culture negative -Seen by vascular surgery , s/p Left femoropopliteal angioplasty and stenting (7x50m Eluvia) on 11/6, -to OR today for toe amputation by Dr DSharol Given- plan to continue asa/statin, resume eliquis when ok with ortho   PAF on chronic anticoagulation Was on heparin drip perioperatively, resume eliquis when ok with ortho  Essential hypertension Bp stable on current regimen    Diastolic congestive heart failure Review of 2D echocardiogram from 05/2021 with left ventricle ejection fraction of  65 -70%.  Appears to be compensated at this time.   Home med torsemide has been on hold since admission, plan to restart in the next 1 to 2 days  CAD S/p CABG -Continue Imdur and statin     History of CVA with residual deficit Patient suffered left middle cerebral artery stroke in 05/2021 with residual right hand weakness and difficulty speaking.  Continue supportive care.  Continue statins.   CKD stage IIIb Creatinine 1.58 which appears near patient's baseline.  We will continue to monitor BMP while in the [name].   History of rheumatoid arthritis/PMR Needing assistance with ADLs.  We will get PT evaluation.  Wheelchair-bound currently. On leflunomide and prednisone.  We will continue to hold leflunomide.  Continue prednisone.   Dementia On Namenda and galantamine we will continue.   OSA -Continue CPAP at night   FTT: has  been wheelchair bound for the last two years Open to CIR/SNF if needed Aids M-F help at home  Subjective:  Npo, awaiting for toe amputation  Wife wants covid vaccine Wants urine checked due to frequent uti, last treated 8 weeks ago with bactrim Desire Pt eval after procedure to decide on disposition, she said CIR helped last time after CVA   Chronic low back pain, would like to try lidocaine patch  Pressure Injury 11/02/21 Heel Right Deep Tissue Pressure Injury - Purple or maroon localized area of discolored intact skin or blood-filled blister due to damage of underlying soft tissue from pressure and/or shear. (Active)  11/02/21 (Initially identified 10/22/20; follow at wound clinic; wife doing dressings) 1606  Location: Heel  Location Orientation: Right  Staging: Deep Tissue Pressure Injury - Purple or maroon localized area of discolored intact skin or blood-filled blister due to damage of underlying soft tissue from pressure and/or shear.  Wound Description (Comments):   Present on Admission: Yes      Assessment & Plan:  Principal Problem:   Skin ulcers of foot, bilateral (HCC) Active Problems:   Paroxysmal atrial fibrillation (HCC)   Essential hypertension   Chronic diastolic CHF (congestive heart failure) (HCC)   CAD (coronary artery disease), native coronary artery   PAD (peripheral artery disease) (HCC)   History of CVA with residual deficit   CKD (chronic kidney disease) stage 3, GFR 30-59 ml/min (HCC)   Polymyalgia rheumatica (HCC)   Dementia (HCC)   OSA (obstructive sleep apnea)   Osteomyelitis of second toe of left  foot ([name])   Gangrene of toe of left foot ([name])    I have Reviewed nursing notes, Vitals, pain scores, I/o's, Lab results and  imaging results since pt's last encounter, details please see discussion above  I ordered the following labs:  Unresulted Labs (From admission, onward)     Start     Ordered   11/09/21 2841  Basic metabolic panel  Tomorrow  morning,   R        11/08/21 2217   11/09/21 0500  Magnesium  Tomorrow morning,   R        11/08/21 2217   11/08/21 1157  Urine Culture  (Urine Culture)  Once,   R       Question:  Indication  Answer:  Dysuria   11/08/21 1157   11/08/21 0500  Heparin level (unfractionated)  Daily at 5am,   R      11/06/21 1318   11/04/21 0500  CBC  Daily at 5am,   R      11/03/21 0723   11/02/21 1050  Urinalysis, Routine w reflex microscopic  Once,   URGENT        11/02/21 1049             DVT prophylaxis: SCD's Start: 11/08/21 2046   Code Status:   Code Status: DNR  Family Communication: wife at bedside  Disposition:   Dispo: The patient is from: home              Anticipated d/c is to: [name] vs SNF              Anticipated d/c date is: >24hrs, need ortho clearance, need Pt eval  Antimicrobials:   Anti-infectives (From admission, onward)    Start     Dose/Rate Route Frequency Ordered Stop   11/08/21 2200  ceFAZolin (ANCEF) IVPB 2g/100 mL premix        2 g 200 mL/hr over 30 Minutes Intravenous Every 8 hours 11/08/21 2045 11/09/21 1359   11/08/21 0945  ceFAZolin (ANCEF) IVPB 2g/100 mL premix        2 g 200 mL/hr over 30 Minutes Intravenous On call to O.R. 11/08/21 0856 11/08/21 1819           Objective: Vitals:   11/08/21 1930 11/08/21 1945 11/08/21 1946 11/08/21 2041  BP: (!) 163/71 (!) 167/68  (!) 123/54  Pulse: 88 86 85 66  Resp: '16 18 19 15  '$ Temp:  99.4 F (37.4 C)  99.9 F (37.7 C)  TempSrc:    Oral  SpO2: 94% 91% 93% 95%  Weight:      Height:        Intake/Output Summary (Last 24 hours) at 11/08/2021 2217 Last data filed at 11/08/2021 1855 Gross per 24 hour  Intake 2141.63 ml  Output 1400 ml  Net 741.63 ml   Filed Weights   11/02/21 1045 11/08/21 1612  Weight: 98 kg 98 kg    Examination:  General exam: alert, interactive Respiratory system: Clear to auscultation. Respiratory effort normal. Cardiovascular system:  RRR.  Gastrointestinal system: Abdomen  is nondistended, soft and nontender.  Normal bowel sounds heard. Central nervous system: Alert and orientedx3 . Right and weakness, mild dysarthria from prior cva in 05/2021  Extremities:  no edema Skin: No rashes, lesions or ulcers Psychiatry: calm and cooperative     Data Reviewed: I have personally reviewed  labs and visualized  imaging studies since the last encounter and formulate the plan  Scheduled Meds:  amiodarone  200 mg Oral Daily   vitamin C  1,000 mg Oral Daily   aspirin EC  81 mg Oral Daily   atorvastatin  40 mg Oral Daily   cholecalciferol  1,000 Units Oral Daily   vitamin B-12  1,000 mcg Oral Daily   docusate sodium  100 mg Oral BID   ezetimibe  10 mg Oral Daily   finasteride  5 mg Oral Daily   gabapentin  200 mg Oral BID   galantamine  8 mg Oral Q breakfast   isosorbide mononitrate  60 mg Oral Daily   lidocaine  1 patch Transdermal Q24H   memantine  10 mg Oral BID   metoprolol succinate  25 mg Oral Daily   [START ON 11/09/2021] nutrition supplement (JUVEN)  1 packet Oral BID BM   pantoprazole  40 mg Oral Daily   polyethylene glycol  17 g Oral Daily   potassium chloride  10 mEq Oral Daily   predniSONE  3 mg Oral Q breakfast   psyllium  1 packet Oral Daily   sodium chloride flush  3 mL Intravenous Q12H   sodium chloride flush  3 mL Intravenous Q12H   tamsulosin  0.4 mg Oral QPC supper   zinc sulfate  220 mg Oral Daily   Continuous Infusions:  sodium chloride 100 mL/hr at 11/06/21 7062   sodium chloride     sodium chloride      ceFAZolin (ANCEF) IV     magnesium sulfate bolus IVPB       LOS: 6 days     Florencia Reasons, MD PhD FACP [facility]  Available via Epic secure chat 7am-7pm for nonurgent issues Please page for urgent issues To page the attending provider between 7A-7P or the covering provider during after hours 7P-7A, please log into the web site [email or web address] and access using universal [place] password for that web site. If you  do not have the password, please call the [name] operator.    11/08/2021, 10:17 PM

## 2021-11-08 NOTE — Anesthesia Postprocedure Evaluation (Signed)
Anesthesia Post Note  Patient: [name] [name]  Procedure(s) Performed: LEFT FOOT 2ND RAY AMPUTATION (Left)     Patient location during evaluation: PACU Anesthesia Type: MAC Level of consciousness: awake and alert Pain management: pain level controlled Vital Signs Assessment: post-procedure vital signs reviewed and stable Respiratory status: spontaneous breathing, nonlabored ventilation and respiratory function stable Cardiovascular status: stable and blood pressure returned to baseline Anesthetic complications: no   No notable events documented.  Last Vitals:  Vitals:   11/08/21 1915 11/08/21 1930  BP: (!) 155/90 (!) 163/71  Pulse: 85 88  Resp: 15 16  Temp:    SpO2: 91% 94%    Last Pain:  Vitals:   11/08/21 1930  TempSrc:   PainSc: [name]

## 2021-11-08 NOTE — Transfer of Care (Signed)
Immediate Anesthesia Transfer of Care Note  Patient: [name] [name]  Procedure(s) Performed: LEFT FOOT 2ND RAY AMPUTATION (Left)  Patient Location: PACU  Anesthesia Type:MAC  Level of Consciousness: awake and patient cooperative  Airway & Oxygen Therapy: Patient Spontanous Breathing and Patient connected to face mask oxygen  Post-op Assessment: Report given to RN, Post -op Vital signs reviewed and stable, and Patient moving all extremities  Post vital signs: Reviewed and stable  Last Vitals:  Vitals Value Taken Time  BP 115/73 11/08/21 1839  Temp    Pulse 90 11/08/21 1841  Resp 21 11/08/21 1841  SpO2 95 % 11/08/21 1841  Vitals shown include unvalidated device data.  Last Pain:  Vitals:   11/08/21 1629  TempSrc:   PainSc: 3          Complications: No notable events documented.

## 2021-11-09 ENCOUNTER — Encounter (HOSPITAL_COMMUNITY): Payer: Self-pay | Admitting: Orthopedic Surgery

## 2021-11-09 DIAGNOSIS — L97529 Non-pressure chronic ulcer of other part of left foot with unspecified severity: Secondary | ICD-10-CM | POA: Diagnosis not present

## 2021-11-09 DIAGNOSIS — L97519 Non-pressure chronic ulcer of other part of right foot with unspecified severity: Secondary | ICD-10-CM | POA: Diagnosis not present

## 2021-11-09 LAB — CBC
HCT: 29.1 % — ABNORMAL LOW (ref 39.0–52.0)
Hemoglobin: 8.9 g/dL — ABNORMAL LOW (ref 13.0–17.0)
MCH: 28.3 pg (ref 26.0–34.0)
MCHC: 30.6 g/dL (ref 30.0–36.0)
MCV: 92.4 fL (ref 80.0–100.0)
Platelets: 144 10*3/uL — ABNORMAL LOW (ref 150–400)
RBC: 3.15 MIL/uL — ABNORMAL LOW (ref 4.22–5.81)
RDW: 16.2 % — ABNORMAL HIGH (ref 11.5–15.5)
WBC: 8.4 10*3/uL (ref 4.0–10.5)
nRBC: 0 % (ref 0.0–0.2)

## 2021-11-09 LAB — URINE CULTURE

## 2021-11-09 LAB — MAGNESIUM: Magnesium: 1.9 mg/dL (ref 1.7–2.4)

## 2021-11-09 LAB — BASIC METABOLIC PANEL
Anion gap: 12 (ref 5–15)
BUN: 18 mg/dL (ref 8–23)
CO2: 19 mmol/L — ABNORMAL LOW (ref 22–32)
Calcium: 9.2 mg/dL (ref 8.9–10.3)
Chloride: 103 mmol/L (ref 98–111)
Creatinine, Ser: 1.6 mg/dL — ABNORMAL HIGH (ref 0.61–1.24)
GFR, Estimated: 40 mL/min — ABNORMAL LOW (ref >60)
Glucose, Bld: 147 mg/dL — ABNORMAL HIGH (ref 70–99)
Potassium: 3.6 mmol/L (ref 3.5–5.1)
Sodium: 134 mmol/L — ABNORMAL LOW (ref 135–145)

## 2021-11-09 LAB — HEPARIN LEVEL (UNFRACTIONATED): Heparin Unfractionated: <0.1 IU/mL — ABNORMAL LOW (ref 0.30–0.70)

## 2021-11-09 MED ORDER — GLUCERNA SHAKE PO LIQD
237.0000 mL | Freq: Three times a day (TID) | ORAL | Status: DC
  Administered 2021-11-10 – 2021-11-14 (×12): 237 mL via ORAL

## 2021-11-09 MED ORDER — ONDANSETRON HCL 4 MG/2ML IJ SOLN
INTRAMUSCULAR | Status: AC
  Filled 2021-11-09: qty 2

## 2021-11-09 MED ORDER — ISOSORBIDE MONONITRATE ER 30 MG PO TB24
30.0000 mg | ORAL_TABLET | Freq: Every day | ORAL | Status: DC
  Administered 2021-11-10 – 2021-11-14 (×5): 30 mg via ORAL
  Filled 2021-11-09 (×6): qty 1

## 2021-11-09 MED ORDER — PHENYLEPHRINE 80 MCG/ML (10ML) SYRINGE FOR IV PUSH (FOR BLOOD PRESSURE SUPPORT)
PREFILLED_SYRINGE | INTRAVENOUS | Status: AC
  Filled 2021-11-09: qty 20

## 2021-11-09 MED ORDER — LIDOCAINE 2% (20 MG/ML) 5 ML SYRINGE
INTRAMUSCULAR | Status: AC
  Filled 2021-11-09: qty 20

## 2021-11-09 MED ORDER — ONDANSETRON HCL 4 MG/2ML IJ SOLN
INTRAMUSCULAR | Status: AC
  Filled 2021-11-09: qty 6

## 2021-11-09 MED ORDER — APIXABAN 2.5 MG PO TABS
2.5000 mg | ORAL_TABLET | Freq: Two times a day (BID) | ORAL | Status: DC
  Administered 2021-11-09 – 2021-11-14 (×10): 2.5 mg via ORAL
  Filled 2021-11-09 (×10): qty 1

## 2021-11-09 MED ORDER — SUCCINYLCHOLINE CHLORIDE 200 MG/10ML IV SOSY
PREFILLED_SYRINGE | INTRAVENOUS | Status: AC
  Filled 2021-11-09: qty 20

## 2021-11-09 MED ORDER — ROCURONIUM BROMIDE 10 MG/ML (PF) SYRINGE
PREFILLED_SYRINGE | INTRAVENOUS | Status: AC
  Filled 2021-11-09: qty 10

## 2021-11-09 MED ORDER — GLYCOPYRROLATE PF 0.2 MG/ML IJ SOSY
PREFILLED_SYRINGE | INTRAMUSCULAR | Status: AC
  Filled 2021-11-09: qty 1

## 2021-11-09 MED ORDER — PROPOFOL 10 MG/ML IV BOLUS
INTRAVENOUS | Status: AC
  Filled 2021-11-09: qty 20

## 2021-11-09 MED ORDER — DEXAMETHASONE SODIUM PHOSPHATE 10 MG/ML IJ SOLN
INTRAMUSCULAR | Status: AC
  Filled 2021-11-09: qty 3

## 2021-11-09 NOTE — Progress Notes (Signed)
Patient ID: [name] [name], male   DOB: 1926/04/29, 88 y.o.   MRN: [id number] Patient is postoperative day 1 left second ray amputation.  Patient ideally needs to be nonweightbearing on the left lower extremity.  Partial weightbearing to facilitate ambulation is most likely necessary.  Plan for discharge planning based on therapy recommendations.  Patient will need to continue with the foam boots for both lower extremities.

## 2021-11-09 NOTE — Progress Notes (Signed)
Pt self administers his home CPAP.

## 2021-11-09 NOTE — Evaluation (Signed)
Occupational Therapy Evaluation Patient Details Name: [name] [name] MRN: [id number] DOB: October 14, 1926 Today's Date: 11/09/2021   History of Present Illness Pt is 88 y/o M admitted to [facility] on 11/02/21 with B foot wounds getting worse. 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 PMH to include HTN, HLD, CAD s/p CABG, PAF, CHF, CVA, PFD, AAA.   Clinical Impression   PTA patient living with spouse, needing assist for ADLs, transfers and using w/c for mobility.  Pt has support of spouse and PCA 5days/week from 8am-12pm.  Patient admitted for above and presents with problem list below, including impaired balance, generalized weakness, decreased activity tolerance, pain and precautions.  Patient oriented and able to follow commands with increased time, demonstrating decreased problem solving and safety.  He currently requires max assist +2 for bed mobility, min to total assist for ADLs; sitting EOB with R lateral and posterior lean with poor tolerance due to pain. Based on performance today, pt will best benefit from continued OT services acutely and after dc at SNF level to decrease burden of care for ADLs and transfers.  Will follow.      Recommendations for follow up therapy are one component of a multi-disciplinary discharge planning process, led by the attending physician.  Recommendations may be updated based on patient status, additional functional criteria and insurance authorization.   Follow Up Recommendations  Skilled nursing-short term rehab (<3 hours/day)    Assistance Recommended at Discharge Frequent or constant Supervision/Assistance  Patient can return home with the following Two people to help with walking and/or transfers;Two people to help with bathing/dressing/bathroom;Assistance with cooking/housework;Direct supervision/assist for medications management;Direct supervision/assist for financial management;Assist for transportation;Help with stairs or ramp for entrance    Functional Status  Assessment  Patient has had a recent decline in their functional status and demonstrates the ability to make significant improvements in function in a reasonable and predictable amount of time.  Equipment Recommendations  Other (comment) (defer)    Recommendations for Other Services Rehab consult     Precautions / Restrictions Precautions Precautions: Fall Restrictions Weight Bearing Restrictions: Yes LLE Weight Bearing: Non weight bearing Other Position/Activity Restrictions: B foot wounds      Mobility Bed Mobility Overal bed mobility: Needs Assistance Bed Mobility: Supine to Sit, Sit to Supine     Supine to sit: Max assist, +2 for physical assistance, HOB elevated Sit to supine: +2 for physical assistance, HOB elevated, Total assist   General bed mobility comments: supine <> sit maxA+2, poor trunk control demonstrated, required assistance of LLE due to pain and NWB. pt with minimal ability to utilize rails during bed mobility, even with frequent cues    Transfers                   General transfer comment: unable      Balance Overall balance assessment: Needs assistance Sitting-balance support: Bilateral upper extremity supported, Feet supported Sitting balance-Leahy Scale: Poor Sitting balance - Comments: pt min-maxA for sitting upright during entire mobility sitting EOB, when cued to corrent R lean pt was unable to voluntarily do so Postural control: Posterior lean, Right lateral lean                                 ADL either performed or assessed with clinical judgement   ADL Overall ADL's : Needs assistance/impaired     Grooming: Set up;Bed level  Upper Body Dressing : Moderate assistance;Sitting   Lower Body Dressing: Total assistance;+2 for physical assistance;+2 for safety/equipment;Sitting/lateral leans;Bed level     Toilet Transfer Details (indicate cue type and reason): deferred         Functional mobility  during ADLs: Maximal assistance;+2 for physical assistance;+2 for safety/equipment General ADL Comments: limited to EOB     Vision   Vision Assessment?: No apparent visual deficits     Perception     Praxis      Pertinent Vitals/Pain Pain Assessment Pain Assessment: Faces Faces Pain Scale: Hurts even more Pain Location: R arm, back pain Pain Descriptors / Indicators: Discomfort Pain Intervention(s): Limited activity within patient's tolerance, Monitored during session, Repositioned     Hand Dominance Right   Extremity/Trunk Assessment Upper Extremity Assessment Upper Extremity Assessment: Generalized weakness   Lower Extremity Assessment Lower Extremity Assessment: Defer to PT evaluation LLE Deficits / Details: NWB LLE: Unable to fully assess due to pain   Cervical / Trunk Assessment Cervical / Trunk Assessment: Kyphotic   Communication Communication Communication: Expressive difficulties   Cognition Arousal/Alertness: Awake/alert Behavior During Therapy: WFL for tasks assessed/performed Overall Cognitive Status: Impaired/Different from baseline Area of Impairment: Following commands, Safety/judgement, Awareness, Problem solving                       Following Commands: Follows one step commands consistently, Follows one step commands with increased time, Follows multi-step commands inconsistently Safety/Judgement: Decreased awareness of safety, Decreased awareness of deficits Awareness: Emergent Problem Solving: Slow processing, Difficulty sequencing, Requires verbal cues General Comments: pt spouse present who states behavior todays is very typical for him, sort of "go with the flow" and not always able to process through things. anticipate baseline. oriented to person, place, and situation.     General Comments  VSS, RN notified on R heel bleeding when sitting EOB    Exercises     Shoulder Instructions      Home Living Family/patient expects to  be discharged to:: Private residence Living Arrangements: Spouse/significant other Available Help at Discharge: Family;Personal care attendant;Available PRN/intermittently Type of Home: House Home Access: Elevator     Home Layout: Able to live on main level with bedroom/bathroom     Bathroom Shower/Tub: Occupational psychologist: Standard Bathroom Accessibility: Yes How Accessible: Accessible via wheelchair;Accessible via walker Home Equipment: Rollator (4 wheels);Rolling Walker (2 wheels);Wheelchair - manual;Grab bars - tub/shower;Shower seat;Wheelchair - power;Grab bars - toilet;Shower seat - built in          Prior Functioning/Environment Prior Level of Function : Needs assist             Mobility Comments: Stand vs slide transfers with "a lot" of assist. Standard w/c in home, power w/c in community. Prior to that ambulated short distances with rollator. ADLs Comments: PCA M-F for 8-12pm, primarily to assist with bathing/shower, can do UB/grooming/geeding but needs assist with LB and all IADLs        OT Problem List: Decreased strength;Decreased activity tolerance;Impaired balance (sitting and/or standing);Decreased safety awareness;Decreased cognition;Decreased knowledge of use of DME or AE;Decreased knowledge of precautions;Pain      OT Treatment/Interventions: Self-care/ADL training;Therapeutic exercise;DME and/or AE instruction;Therapeutic activities;Patient/family education;Balance training    OT Goals(Current goals can be found in the care plan section) Acute Rehab OT Goals Patient Stated Goal: less pain OT Goal Formulation: With patient Time For Goal Achievement: 11/23/21 Potential to Achieve Goals: Fair  OT Frequency: Min 2X/week  Co-evaluation PT/OT/SLP Co-Evaluation/Treatment: Yes Reason for Co-Treatment: For patient/therapist safety;To address functional/ADL transfers PT goals addressed during session: Balance;Strengthening/ROM;Mobility/safety  with mobility OT goals addressed during session: ADL's and self-care      AM-PAC OT "6 Clicks" Daily Activity     Outcome Measure Help from another person eating meals?: A Little Help from another person taking care of personal grooming?: A Little Help from another person toileting, which includes using toliet, bedpan, or urinal?: Total Help from another person bathing (including washing, rinsing, drying)?: A Lot Help from another person to put on and taking off regular upper body clothing?: A Lot Help from another person to put on and taking off regular lower body clothing?: Total 6 Click Score: 12   End of Session Nurse Communication: Mobility status;Other (comment) (R heel needs dressing replaced)  Activity Tolerance: Patient limited by pain Patient left: in bed;with call bell/phone within reach;with bed alarm set;with family/visitor present  OT Visit Diagnosis: Other abnormalities of gait and mobility (R26.89);Muscle weakness (generalized) (M62.81);Pain Pain - Right/Left: Left Pain - part of body: Ankle and joints of foot (back)                Time: 1021-1173 OT Time Calculation (min): 35 min Charges:  OT General Charges $OT Visit: 1 Visit OT Evaluation $OT Eval Moderate Complexity: [place], OT Acute Rehabilitation Services Office [phone]   Delight Stare 11/09/2021, 2:13 PM

## 2021-11-09 NOTE — Progress Notes (Signed)
PROGRESS NOTE    [name] [name]  XTG:[id number] DOB: 1926/03/17 DOA: 11/02/2021 PCP: [name] Infante, MD     Brief Narrative:   [name] [name] is a 88 years old male with past medical history significant of hypertension, hyperlipidemia, CAD s/p CABG, PAF s/p maze on chronic anticoagulation, systolic CHF, severe AS s/p TAVR, CVA with residual deficit, PVD, PMR, AAA, OSA on CPAP presented to hospital with worsening of feet wounds.     Subjective:  POD#1 He is now agreeing to snf placement Wife wants covid vaccine   Assessment and Plan:  Principal Problem:   Skin ulcers of foot, bilateral (HCC) Active Problems:   Paroxysmal atrial fibrillation (HCC)   Essential hypertension   Chronic diastolic CHF (congestive heart failure) (HCC)   CAD (coronary artery disease), native coronary artery   PAD (peripheral artery disease) (HCC)   History of CVA with residual deficit   CKD (chronic kidney disease) stage 3, GFR 30-59 ml/min (HCC)   Polymyalgia rheumatica (HCC)   Dementia (HCC)   OSA (obstructive sleep apnea)   Osteomyelitis of second toe of left foot (HCC)   Gangrene of toe of left foot (HCC)    PAD with Skin ulcers of feet, bilateral, right heel pressure ulceration. -x ray no osteomyelitis -blood culture negative -Seen by vascular surgery , s/p Left femoropopliteal angioplasty and stenting (7x24m Eluvia) on 11/6, -s/p ray amputation  on 11/8 by Dr DSharol Given- plan to continue asa/statin, resume eliquis post op  Apply iodine from the swabsticks or swab pads from clean utility to bilateral foot/toe wounds.  Allow to air dry. Do NOT apply foam dressings to the wounds.  If a dressing is preferred, use dry gauze. Post op shoe  PAF on chronic anticoagulation Was on heparin drip perioperatively, resume eliquis post op  Essential hypertension Bp stable on current regimen    Diastolic congestive heart failure Review of 2D echocardiogram from 05/2021 with left ventricle ejection fraction  of  65 -70%.  Appears to be compensated at this time.   Home med torsemide has been on hold since admission, plan to restart in the next 1 to 2 days Currently appear dehydrated with poor oral intake  CAD S/p CABG -Continue Imdur and statin     History of CVA with residual deficit Patient suffered left middle cerebral artery stroke in 05/2021 with residual right hand weakness and difficulty speaking.  Continue supportive care.  Continue statins.   CKD stage IIIb Creatinine 1.58 which appears near patient's baseline.  We will continue to monitor BMP while in the hospital.   History of rheumatoid arthritis/PMR Needing assistance with ADLs.  We will get PT evaluation.  Wheelchair-bound currently. On leflunomide and prednisone.  We will continue to hold leflunomide.  Continue prednisone.   Dementia On Namenda and galantamine we will continue.   OSA -Continue CPAP at night   FTT: has been wheelchair bound for the last two years SNF placement  Pressure Injury 11/02/21 Heel Right Deep Tissue Pressure Injury - Purple or maroon localized area of discolored intact skin or blood-filled blister due to damage of underlying soft tissue from pressure and/or shear. (Active)  11/02/21 (Initially identified 10/22/20; follow at wound clinic; wife doing dressings) 1606  Location: Heel  Location Orientation: Right  Staging: Deep Tissue Pressure Injury - Purple or maroon localized area of discolored intact skin or blood-filled blister due to damage of underlying soft tissue from pressure and/or shear.  Wound Description (Comments):   Present on Admission:  Yes      I have Reviewed nursing notes, Vitals, pain scores, I/o's, Lab results and  imaging results since pt's last encounter, details please see discussion above  I ordered the following labs:  Unresulted Labs (From admission, onward)     Start     Ordered   11/10/21 4970  Basic metabolic panel  Tomorrow morning,   R        11/09/21 1854    11/04/21 0500  CBC  Daily at 5am,   R      11/03/21 0723             DVT prophylaxis: apixaban (ELIQUIS) tablet 2.5 mg Start: 11/09/21 1400 SCD's Start: 11/08/21 2046 apixaban (ELIQUIS) tablet 2.5 mg   Code Status:   Code Status: DNR  Family Communication: wife at bedside  Disposition:   Dispo: The patient is from: home              Anticipated d/c is to: SNF              Anticipated d/c date is: SNF if am labs stable and patient clinically stable   Antimicrobials:   Anti-infectives (From admission, onward)    Start     Dose/Rate Route Frequency Ordered Stop   11/08/21 2200  ceFAZolin (ANCEF) IVPB 2g/100 mL premix        2 g 200 mL/hr over 30 Minutes Intravenous Every 8 hours 11/08/21 2045 11/09/21 0709   11/08/21 0945  ceFAZolin (ANCEF) IVPB 2g/100 mL premix        2 g 200 mL/hr over 30 Minutes Intravenous On call to O.R. 11/08/21 0856 11/08/21 1819           Objective: Vitals:   11/09/21 0817 11/09/21 0930 11/09/21 1111 11/09/21 1608  BP: (!) 147/73  (!) 115/53 (!) 109/53  Pulse: (!) 59 63 60 (!) 53  Resp: '19  18 18  '$ Temp: 98.6 F (37 C)  97.7 F (36.5 C) 98.1 F (36.7 C)  TempSrc: Oral  Oral Oral  SpO2: 98%  95% 97%  Weight:      Height:        Intake/Output Summary (Last 24 hours) at 11/09/2021 1854 Last data filed at 11/09/2021 1000 Gross per 24 hour  Intake 226.67 ml  Output 350 ml  Net -123.33 ml   Filed Weights   11/02/21 1045 11/08/21 1612  Weight: 98 kg 98 kg    Examination:  General exam: sleepy Respiratory system: Clear to auscultation. Respiratory effort normal. Cardiovascular system:  RRR.  Gastrointestinal system: Abdomen is nondistended, soft and nontender.  Normal bowel sounds heard. Central nervous system: Alert and orientedx3 . Right and weakness, mild dysarthria from prior cva in 05/2021  Extremities:  left foot post op changes Skin: right heal wound presents on admission  Psychiatry: sleepy    Data Reviewed: I have  personally reviewed  labs and visualized  imaging studies since the last encounter and formulate the plan        Scheduled Meds:  amiodarone  200 mg Oral Daily   apixaban  2.5 mg Oral BID   vitamin C  1,000 mg Oral Daily   aspirin EC  81 mg Oral Daily   atorvastatin  40 mg Oral Daily   cholecalciferol  1,000 Units Oral Daily   vitamin B-12  1,000 mcg Oral Daily   docusate sodium  100 mg Oral BID   ezetimibe  10 mg Oral Daily   feeding supplement (  GLUCERNA SHAKE)  237 mL Oral TID BM   finasteride  5 mg Oral Daily   gabapentin  200 mg Oral BID   galantamine  8 mg Oral Q breakfast   [START ON 11/10/2021] isosorbide mononitrate  30 mg Oral Daily   lidocaine  1 patch Transdermal Q24H   memantine  10 mg Oral BID   metoprolol succinate  25 mg Oral Daily   nutrition supplement (JUVEN)  1 packet Oral BID BM   pantoprazole  40 mg Oral Daily   polyethylene glycol  17 g Oral Daily   potassium chloride  10 mEq Oral Daily   predniSONE  3 mg Oral Q breakfast   psyllium  1 packet Oral Daily   sodium chloride flush  3 mL Intravenous Q12H   sodium chloride flush  3 mL Intravenous Q12H   tamsulosin  0.4 mg Oral QPC supper   zinc sulfate  220 mg Oral Daily   Continuous Infusions:  sodium chloride 100 mL/hr at 11/06/21 8466   sodium chloride     sodium chloride Stopped (11/09/21 1306)   magnesium sulfate bolus IVPB       LOS: 7 days     Florencia Reasons, MD PhD FACP [facility]  Available via Epic secure chat 7am-7pm for nonurgent issues Please page for urgent issues To page the attending provider between 7A-7P or the covering provider during after hours 7P-7A, please log into the web site [email or web address] and access using universal [place] password for that web site. If you do not have the password, please call the hospital operator.    11/09/2021, 6:54 PM

## 2021-11-09 NOTE — TOC Initial Note (Signed)
Transition of Care [facility]) - Initial/Assessment Note    Patient Details  Name: [name] [name] MRN: [id number] Date of Birth: Sep 21, 1926  Transition of Care [facility]) CM/SW Contact:    Vinie Sill, LCSW Phone Number: 11/09/2021, 4:13 PM  Clinical Narrative:                  CSW spoke with patient's spouse, [name]- CSW introduced self and explained role. [name] states she is aware of PT/OT recommendation and agrees with short term rehab at [facility]. She states she is unable to provide the needed care at home at this time. CSW explained the SNF process.  Patient has been to [facility] and [facility]. No preferred SNF at this time. She was agreeable to send referrals to SNFs in the area. All questions answered.   TOC will provide bed offer once available.   Thurmond Butts, MSW, LCSW Clinical Social Worker    Expected Discharge Plan: Skilled Nursing Facility Barriers to Discharge: Continued Medical Work up, Ship broker, SNF Pending bed offer   Patient Goals and CMS Choice        Expected Discharge Plan and Services Expected Discharge Plan: [place] In-house Referral: Clinical Social Work                                            Prior Living Arrangements/Services   Lives with:: Self, Spouse                   Activities of Daily Living Home Assistive Devices/Equipment: Wheelchair ADL Screening (condition at time of admission) Patient's cognitive ability adequate to safely complete daily activities?: Yes Is the patient deaf or have difficulty hearing?: Yes Does the patient have difficulty seeing, even when wearing glasses/contacts?: No Does the patient have difficulty concentrating, remembering, or making decisions?: Yes Patient able to express need for assistance with ADLs?: No Does the patient have difficulty dressing or bathing?: Yes Independently performs ADLs?: No Communication: Independent Dressing (OT): Dependent Grooming:  Dependent Is this a change from baseline?: Pre-admission baseline Feeding: Independent with device (comment) Bathing: Dependent Toileting: Dependent In/Out Bed: Dependent Walks in Home:  (pt unable to walk) Is this a change from baseline?: Pre-admission baseline Does the patient have difficulty walking or climbing stairs?:  (pt unable to walk) Weakness of Legs: Both Weakness of Arms/Hands: None  Permission Sought/Granted                  Emotional Assessment              Admission diagnosis:  Wound infection [T14.8XXA, L08.9] Patient Active Problem List   Diagnosis Date Noted   Osteomyelitis of second toe of left foot ([place]) 11/07/2021   Gangrene of toe of left foot ([place]) 11/07/2021   Wound infection 11/02/2021   Skin ulcers of foot, bilateral ([place]) 11/02/2021   PAD (peripheral artery disease) ([place]) 11/02/2021   History of CVA with residual deficit 11/02/2021   Left middle cerebral artery stroke ([place]) 05/31/2021   Acute CVA (cerebrovascular accident) ([place]) 05/25/2021   DNR (do not resuscitate) 05/25/2021   Osteoporosis 05/05/2021   Gouty arthritis 02/13/2021   Morbid obesity ([place]) 02/13/2021   Abdominal aortic aneurysm without rupture ([place]) 11/22/2020   Acute renal failure syndrome ([place]) 11/22/2020   Low back pain 11/22/2020   Skin tear of right upper arm without complication 27/78/2423  Pressure injury of skin 10/23/2020   Urinary tract infection with hematuria 10/22/2020   Encephalopathy 10/22/2020   AKI (acute kidney injury) ([place])    Overweight (BMI 25.0-29.9) 08/19/2020   Vertebral fracture, osteoporotic ([place]) 08/18/2020   T8 vertebral fracture ([place]) 08/17/2020   Thrombocytopenia ([place]) 08/17/2020   CKD (chronic kidney disease) stage 3, GFR 30-59 ml/min (HCC) 08/17/2020   Elevated troponin 08/17/2020   Pain due to onychomycosis of toenails of both feet 07/27/2020   Callus of heel 07/27/2020   Bifascicular block 11/11/2019   Chronic diastolic CHF  (congestive heart failure) ([place]) 11/10/2019   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   Severe aortic stenosis 09/29/2019   Aortic valve disorder 38/93/7342   Systolic heart failure ([place]) 03/11/2019   Thrombophilia ([place]) 02/26/2019   Muscle pain 02/11/2019   Weakness 02/06/2019   Polymyalgia rheumatica ([place]) 02/06/2019   Shoulder joint pain 02/02/2019   Anemia 01/26/2019   Malignant melanoma of skin ([place]) 01/26/2019   Pain in right leg 01/26/2019   DDD (degenerative disc disease), cervical 05/28/2018   Encounter for general adult medical examination without abnormal findings 11/19/2017   Carotid artery occlusion 07/03/2017   Hearing loss 07/03/2017   Fall 06/19/2017   Injury of upper extremity 06/19/2017   Neck pain 06/19/2017   Headache 06/19/2017   Visual disturbance 06/19/2017   Ganglion of hand 05/08/2017   Dilatation of aorta ([place]) 02/14/2017   Plantar fascial fibromatosis 02/14/2017   Cardiac murmur, unspecified 02/27/2016   Benign neoplasm of colon 08/24/2015   Dementia ([place]) 08/24/2015   Influenza due to unidentified influenza virus with other respiratory manifestations 03/15/2015   Epistaxis 12/01/2014   OSA (obstructive sleep apnea) 11/18/2014   Gastro-esophageal reflux disease with esophagitis 10/29/2014   Angina pectoris ([place]) 05/04/2014   Abnormal feces 07/31/2013   Other specified disorders of gingiva and edentulous alveolar ridge 07/31/2013   Spontaneous ecchymosis 10/10/2012   Thoracic ascending aortic aneurysm ([place]) 09/29/2012   Microscopic hematuria 04/11/2012   Proteinuria 04/11/2012   S/P laparoscopic cholecystectomy 04/09/2012   S/P CABG x 3 03/06/2012   S/P Maze operation for atrial fibrillation 03/06/2012   Paroxysmal atrial fibrillation ([place]) 02/29/2012   Bitten or stung by nonvenomous insect and other nonvenomous arthropods, initial encounter 12/03/2011   Depression screening 10/17/2011   CAD (coronary artery disease), native  coronary artery    Hyperlipidemia    Essential hypertension    Chronic venous insufficiency    Gout    Benign prostatic hyperplasia    Post-traumatic stress disorder 03/13/2011   Pain in left leg 12/08/2010   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   Fatigue 08/08/2010   Retention of urine 04/18/2010   Constipation 07/05/2009   Kidney stone 07/05/2009   ED (erectile dysfunction) of organic origin 12/14/2008   Impaired fasting glucose 12/14/2008   Vitamin B deficiency 12/14/2008   PCP:  [name], MD Pharmacy:   [facility] [place], [place] AT [address] [place] [address] Phone: [phone] Fax: [phone]  [facility] [address] [place] [address] [place] [address] Phone: [phone] Fax: [phone]     Social Determinants of Health (SDOH) Interventions    Readmission Risk Interventions     No data to display

## 2021-11-09 NOTE — Evaluation (Signed)
Physical Therapy Evaluation Patient Details Name: [name] [name] MRN: [id number] DOB: 09/02/1926 Today's Date: 11/09/2021  History of Present Illness  Pt is 88 y/o M admitted to [facility] on 11/02/21 with B foot wounds getting worse. 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 PMH to include HTN, HLD, CAD s/p CABG, PAF, CHF, CVA, PFD, AAA.   Clinical Impression  Pt presents with an overall decrease in functional mobility secondary to above. PTA, pt utilized manual and power wheelchair for mobility, completes lateral scoot and stand pivot transfers. Wife completes household chores, aide assists with bathing and intermittent clothing management when necessary. Educ on NWB precautions for LLE. Today, pt able to sit EOB max+2 due to poor trunk control and decreased ability to utilize LLE due to pain. Session ended early today because wound on R heel began bleeding after transition to sitting EOB. Pt would benefit from continued acute PT services to maximize functional mobility and independence prior to d/c to SNF level therapies.        Recommendations for follow up therapy are one component of a multi-disciplinary discharge planning process, led by the attending physician.  Recommendations may be updated based on patient status, additional functional criteria and insurance authorization.  Follow Up Recommendations Skilled nursing-short term rehab (<3 hours/day) Can patient physically be transported by private vehicle: No    Assistance Recommended at Discharge Frequent or constant Supervision/Assistance  Patient can return home with the following  Two people to help with walking and/or transfers;A lot of help with bathing/dressing/bathroom;Assist for transportation;Help with stairs or ramp for entrance;Assistance with cooking/housework    Equipment Recommendations Other (comment) (TBD - possible slide board)  Recommendations for Other Services       Functional Status Assessment Patient has had a recent decline in  their functional status and demonstrates the ability to make significant improvements in function in a reasonable and predictable amount of time.     Precautions / Restrictions Precautions Precautions: Fall Restrictions Weight Bearing Restrictions: Yes LLE Weight Bearing: Non weight bearing Other Position/Activity Restrictions: B foot wounds      Mobility  Bed Mobility Overal bed mobility: Needs Assistance Bed Mobility: Supine to Sit, Sit to Supine     Supine to sit: Max assist, +2 for physical assistance, HOB elevated Sit to supine: +2 for physical assistance, Max assist, HOB elevated   General bed mobility comments: supine <> sit maxA+2, poor trunk control demonstrated, required assistance of LLE due to pain and NWB. pt with minimal ability to utilize rails during bed mobility, even with frequent cues    Transfers                   General transfer comment: unable    Ambulation/Gait                  Stairs            Wheelchair Mobility    Modified Rankin (Stroke Patients Only)       Balance Overall balance assessment: Needs assistance Sitting-balance support: Bilateral upper extremity supported, No upper extremity supported, Feet supported Sitting balance-Leahy Scale: Poor Sitting balance - Comments: pt min-maxA for sitting upright during entire mobility sitting EOB, when cued to corrent R lean pt was unable to voluntarily do so Postural control: Posterior lean, Right lateral lean  Pertinent Vitals/Pain Pain Assessment Pain Assessment: Faces Faces Pain Scale: Hurts even more Pain Location: R arm, back pain Pain Descriptors / Indicators: Discomfort Pain Intervention(s): Monitored during session    Home Living Family/patient expects to be discharged to:: Private residence Living Arrangements: Spouse/significant other Available Help at Discharge: Family;Personal care attendant;Available  PRN/intermittently Type of Home: House Home Access: Elevator       Home Layout: Able to live on main level with bedroom/bathroom Home Equipment: Rollator (4 wheels);Rolling Walker (2 wheels);Wheelchair - manual;Grab bars - tub/shower;Shower seat;Wheelchair - power;Grab bars - toilet;Shower seat - built in      Prior Function Prior Level of Function : Needs assist             Mobility Comments: Stand vs slide transfers with "a lot" of assist. Standard w/c in home, power w/c in community. Prior to that ambulated short distances with rollator. ADLs Comments: PCA M-F for 8-12pm, primarily to assist with bathing/shower, can do UB/grooming/geeding but needs assist with LB and all IADLs     Hand Dominance   Dominant Hand: Right    Extremity/Trunk Assessment   Upper Extremity Assessment Upper Extremity Assessment: Generalized weakness    Lower Extremity Assessment Lower Extremity Assessment: Defer to PT evaluation LLE Deficits / Details: NWB LLE: Unable to fully assess due to pain    Cervical / Trunk Assessment Cervical / Trunk Assessment: Kyphotic  Communication   Communication: Expressive difficulties  Cognition Arousal/Alertness: Awake/alert Behavior During Therapy: WFL for tasks assessed/performed Overall Cognitive Status: Within Functional Limits for tasks assessed                                 General Comments: pt spouse present who states behavior todays is very typical for him, sort of "go with the flow" and not always able to process through things. oriented to person, place, and situation.        General Comments General comments (skin integrity, edema, etc.): VSS, RN notified on R heel bleeding when sitting EOB    Exercises     Assessment/Plan    PT Assessment Patient needs continued PT services  PT Problem List Decreased strength;Cardiopulmonary status limiting activity;Decreased mobility;Decreased balance;Decreased activity  tolerance;Decreased range of motion;Decreased knowledge of precautions;Decreased knowledge of use of DME       PT Treatment Interventions DME instruction;Gait training;Functional mobility training;Therapeutic activities;Therapeutic exercise;Wheelchair mobility training;Patient/family education;Balance training    PT Goals (Current goals can be found in the Care Plan section)  Acute Rehab PT Goals Patient Stated Goal: to decrease pain PT Goal Formulation: With patient Time For Goal Achievement: 11/23/21 Potential to Achieve Goals: Good    Frequency Min 3X/week     Co-evaluation PT/OT/SLP Co-Evaluation/Treatment: Yes Reason for Co-Treatment: For patient/therapist safety;To address functional/ADL transfers PT goals addressed during session: Balance;Strengthening/ROM;Mobility/safety with mobility OT goals addressed during session: ADL's and self-care       AM-PAC PT "6 Clicks" Mobility  Outcome Measure Help needed turning from your back to your side while in a flat bed without using bedrails?: A Lot Help needed moving from lying on your back to sitting on the side of a flat bed without using bedrails?: Total Help needed moving to and from a bed to a chair (including a wheelchair)?: Total Help needed standing up from a chair using your arms (e.g., wheelchair or bedside chair)?: Total Help needed to walk in hospital room?: Total Help needed climbing 3-5 steps  with a railing? : Total 6 Click Score: 7    End of Session   Activity Tolerance: Patient limited by pain;Other (comment);Treatment limited secondary to medical complications (Comment) Patient left: in bed;with call bell/phone within reach;with bed alarm set;with family/visitor present Nurse Communication: Mobility status;Other (comment) (R heel bleed) PT Visit Diagnosis: Other abnormalities of gait and mobility (R26.89);Muscle weakness (generalized) (M62.81)    Time: 3664-4034 PT Time Calculation (min) (ACUTE ONLY): 34  min   Charges:   PT Evaluation $PT Eval Moderate Complexity: 1 Mod         [address] [place], SPT   Ollie Claus Silvestro 11/09/2021, 2:19 PM

## 2021-11-10 ENCOUNTER — Inpatient Hospital Stay (HOSPITAL_COMMUNITY): Payer: Medicare Other

## 2021-11-10 DIAGNOSIS — L97519 Non-pressure chronic ulcer of other part of right foot with unspecified severity: Secondary | ICD-10-CM | POA: Diagnosis not present

## 2021-11-10 DIAGNOSIS — M7989 Other specified soft tissue disorders: Secondary | ICD-10-CM

## 2021-11-10 DIAGNOSIS — M79601 Pain in right arm: Secondary | ICD-10-CM | POA: Diagnosis not present

## 2021-11-10 DIAGNOSIS — L97529 Non-pressure chronic ulcer of other part of left foot with unspecified severity: Secondary | ICD-10-CM | POA: Diagnosis not present

## 2021-11-10 LAB — BASIC METABOLIC PANEL
Anion gap: 7 (ref 5–15)
BUN: 29 mg/dL — ABNORMAL HIGH (ref 8–23)
CO2: 20 mmol/L — ABNORMAL LOW (ref 22–32)
Calcium: 9 mg/dL (ref 8.9–10.3)
Chloride: 106 mmol/L (ref 98–111)
Creatinine, Ser: 1.5 mg/dL — ABNORMAL HIGH (ref 0.61–1.24)
GFR, Estimated: 43 mL/min — ABNORMAL LOW (ref >60)
Glucose, Bld: 128 mg/dL — ABNORMAL HIGH (ref 70–99)
Potassium: 3.8 mmol/L (ref 3.5–5.1)
Sodium: 133 mmol/L — ABNORMAL LOW (ref 135–145)

## 2021-11-10 LAB — CBC
HCT: 25.8 % — ABNORMAL LOW (ref 39.0–52.0)
Hemoglobin: 8 g/dL — ABNORMAL LOW (ref 13.0–17.0)
MCH: 28.6 pg (ref 26.0–34.0)
MCHC: 31 g/dL (ref 30.0–36.0)
MCV: 92.1 fL (ref 80.0–100.0)
Platelets: 129 10*3/uL — ABNORMAL LOW (ref 150–400)
RBC: 2.8 MIL/uL — ABNORMAL LOW (ref 4.22–5.81)
RDW: 16 % — ABNORMAL HIGH (ref 11.5–15.5)
WBC: 5.5 10*3/uL (ref 4.0–10.5)
nRBC: 0 % (ref 0.0–0.2)

## 2021-11-10 MED ORDER — SODIUM BICARBONATE 650 MG PO TABS
650.0000 mg | ORAL_TABLET | Freq: Two times a day (BID) | ORAL | Status: AC
  Administered 2021-11-10 – 2021-11-12 (×4): 650 mg via ORAL
  Filled 2021-11-10 (×4): qty 1

## 2021-11-10 NOTE — TOC Progression Note (Signed)
Transition of Care [facility]) - Progression Note    Patient Details  Name: ALEXIUS HANGARTNER MRN: [id number] Date of Birth: Jul 30, 1926  [facility] CM/SW [place], [place] Phone Number: 11/10/2021, 9:58 AM  Clinical Narrative:     CSW received call from patient's spouse- requested to send referral to [facility] sent referral and spoke with Admission Seth Bake- she informed insurance is out of network.   TOC will continue to follow and assist with discharge planning.   Thurmond Butts, MSW, LCSW Clinical Social Worker    Expected Discharge Plan: Skilled Nursing Facility Barriers to Discharge: Continued Medical Work up, Ship broker, SNF Pending bed offer  Expected Discharge Plan and Services Expected Discharge Plan: [place] In-house Referral: Clinical Social Work                                             Social Determinants of Health (SDOH) Interventions    Readmission Risk Interventions     No data to display

## 2021-11-10 NOTE — Progress Notes (Signed)
PROGRESS NOTE    [name] [name]  ZOX:[id number] DOB: 1926-12-09 DOA: 11/02/2021 PCP: [name], MD     Brief Narrative:   [name] [name] is a 88 years old male with past medical history significant of hypertension, hyperlipidemia, CAD s/p CABG, PAF s/p maze on chronic anticoagulation, systolic CHF, severe AS s/p TAVR, CVA with residual deficit, PVD, PMR, AAA, OSA on CPAP presented to hospital with worsening of feet wounds.     Subjective:  Uneventful night, he is aaox3 this am He reports right arm pain, will get venous doppler , elevate right arm Reports chronic back pain and leg pain Reports poor appetite, urine is cloudy , he denies urinary symptoms, wife states he has back pain when he has UTI, will check urine culture He is now agreeing to snf placement Wife wants covid vaccine   Assessment and Plan:  Principal Problem:   Skin ulcers of foot, bilateral ([place]) Active Problems:   Paroxysmal atrial fibrillation (HCC)   Essential hypertension   Chronic diastolic CHF (congestive heart failure) (HCC)   CAD (coronary artery disease), native coronary artery   PAD (peripheral artery disease) ([place])   History of CVA with residual deficit   CKD (chronic kidney disease) stage 3, GFR 30-59 ml/min (HCC)   Polymyalgia rheumatica (HCC)   Dementia (HCC)   OSA (obstructive sleep apnea)   Osteomyelitis of second toe of left foot (HCC)   Gangrene of toe of left foot (HCC)    PAD with Skin ulcers of feet, bilateral, right heel pressure ulceration. -x ray no osteomyelitis -blood culture negative -Seen by vascular surgery , s/p Left femoropopliteal angioplasty and stenting (7x67m Eluvia) on 11/6, -s/p ray amputation  on 11/8 by Dr DSharol Given- plan to continue asa/statin, resumed eliquis post op  Apply iodine from the swabsticks or swab pads from clean utility to bilateral foot/toe wounds.  Allow to air dry. Do NOT apply foam dressings to the wounds.  If a dressing is preferred, use dry  gauze. Post op shoe SNF placement  PAF on chronic anticoagulation Was on heparin drip perioperatively, resumed eliquis post op  Essential hypertension Bp stable on current regimen    Diastolic congestive heart failure Review of 2D echocardiogram from 05/2021 with left ventricle ejection fraction of  65 -70%.  Appears to be compensated at this time.   Home med torsemide has been on hold since admission, plan to restart in the next 1 to 2 days Currently appear dehydrated with poor oral intake  CAD S/p CABG -Continue Imdur and statin     History of CVA with residual deficit Patient suffered left middle cerebral artery stroke in 05/2021 with residual right hand weakness and difficulty speaking.  Continue supportive care.  Continue statins.   CKD stage IIIb Creatinine 1.58 which appears near patient's baseline.   Metabolic acidosis, start sodium bicarb supplement   history of rheumatoid arthritis/PMR Needing assistance with ADLs.  We will get PT evaluation.  Wheelchair-bound currently. On leflunomide and prednisone.  We will continue to hold leflunomide.  Continue prednisone.   Dementia Continue home meds Namenda and galantamine Today he is AAOX#3   OSA -Continue CPAP at night   FTT: has been wheelchair bound for the last two years SNF placement  Right forearm edema @ IV site Venous ultrasound  Pressure Injury 11/02/21 Heel Right Deep Tissue Pressure Injury - Purple or maroon localized area of discolored intact skin or blood-filled blister due to damage of underlying soft tissue from pressure  and/or shear. (Active)  11/02/21 (Initially identified 10/22/20; follow at wound clinic; wife doing dressings) 1606  Location: Heel  Location Orientation: Right  Staging: Deep Tissue Pressure Injury - Purple or maroon localized area of discolored intact skin or blood-filled blister due to damage of underlying soft tissue from pressure and/or shear.  Wound Description (Comments):    Present on Admission: Yes      I have Reviewed nursing notes, Vitals, pain scores, I/o's, Lab results and  imaging results since pt's last encounter, details please see discussion above  I ordered the following labs:  Unresulted Labs (From admission, onward)     Start     Ordered   11/11/21 4540  Basic metabolic panel  Daily at 5am,   R     Question:  Specimen collection method  Answer:  Lab=Lab collect   11/10/21 1819   11/10/21 1012  Urine Culture  (Urine Culture)  Once,   R       Question:  Indication  Answer:  Flank Pain   11/10/21 1011   11/04/21 0500  CBC  Daily at 5am,   R      11/03/21 0723             DVT prophylaxis: apixaban (ELIQUIS) tablet 2.5 mg Start: 11/09/21 1400 SCD's Start: 11/08/21 2046 apixaban (ELIQUIS) tablet 2.5 mg   Code Status:   Code Status: DNR  Family Communication: wife at bedside  Disposition:   Dispo: The patient is from: home              Anticipated d/c is to: SNF              Anticipated d/c date is: monitor cr, urine culture, SNF if am labs stable and patient clinically stable , wife desires covid booster prior to discharge  Antimicrobials:   Anti-infectives (From admission, onward)    Start     Dose/Rate Route Frequency Ordered Stop   11/08/21 2200  ceFAZolin (ANCEF) IVPB 2g/100 mL premix        2 g 200 mL/hr over 30 Minutes Intravenous Every 8 hours 11/08/21 2045 11/09/21 0709   11/08/21 0945  ceFAZolin (ANCEF) IVPB 2g/100 mL premix        2 g 200 mL/hr over 30 Minutes Intravenous On call to O.R. 11/08/21 0856 11/08/21 1819           Objective: Vitals:   11/10/21 0414 11/10/21 0800 11/10/21 1200 11/10/21 1600  BP: 125/64 (!) 152/65 131/68 (!) 143/68  Pulse: 60 62 60 (!) 59  Resp: '17 20 18 16  '$ Temp: (!) 97.5 F (36.4 C) 97.9 F (36.6 C) 97.9 F (36.6 C) 97.9 F (36.6 C)  TempSrc: Axillary Oral Oral Oral  SpO2: 100% 99% 96% 99%  Weight:      Height:        Intake/Output Summary (Last 24 hours) at  11/10/2021 1819 Last data filed at 11/10/2021 1752 Gross per 24 hour  Intake 1731.41 ml  Output 1050 ml  Net 681.41 ml   Filed Weights   11/02/21 1045 11/08/21 1612  Weight: 98 kg 98 kg    Examination:  General exam: aaox3 Respiratory system: Clear to auscultation. Respiratory effort normal. Cardiovascular system:  RRR.  Gastrointestinal system: Abdomen is nondistended, soft and nontender.  Normal bowel sounds heard. Central nervous system: Alert and orientedx3 . Right and weakness, mild dysarthria from prior cva in 05/2021  Extremities:  left foot post op changes Skin: right heal wound  presents on admission , right forearm edema Psychiatry: calm and cooperative    Data Reviewed: I have personally reviewed  labs and visualized  imaging studies since the last encounter and formulate the plan        Scheduled Meds:  amiodarone  200 mg Oral Daily   apixaban  2.5 mg Oral BID   vitamin C  1,000 mg Oral Daily   aspirin EC  81 mg Oral Daily   atorvastatin  40 mg Oral Daily   cholecalciferol  1,000 Units Oral Daily   vitamin B-12  1,000 mcg Oral Daily   docusate sodium  100 mg Oral BID   ezetimibe  10 mg Oral Daily   feeding supplement (GLUCERNA SHAKE)  237 mL Oral TID BM   finasteride  5 mg Oral Daily   gabapentin  200 mg Oral BID   galantamine  8 mg Oral Q breakfast   isosorbide mononitrate  30 mg Oral Daily   lidocaine  1 patch Transdermal Q24H   memantine  10 mg Oral BID   metoprolol succinate  25 mg Oral Daily   nutrition supplement (JUVEN)  1 packet Oral BID BM   pantoprazole  40 mg Oral Daily   polyethylene glycol  17 g Oral Daily   potassium chloride  10 mEq Oral Daily   predniSONE  3 mg Oral Q breakfast   psyllium  1 packet Oral Daily   sodium bicarbonate  650 mg Oral BID   sodium chloride flush  3 mL Intravenous Q12H   sodium chloride flush  3 mL Intravenous Q12H   tamsulosin  0.4 mg Oral QPC supper   zinc sulfate  220 mg Oral Daily   Continuous  Infusions:  sodium chloride 100 mL/hr at 11/06/21 5465   sodium chloride     sodium chloride Stopped (11/09/21 1306)   magnesium sulfate bolus IVPB       LOS: 8 days     Florencia Reasons, MD PhD FACP [facility]  Available via Epic secure chat 7am-7pm for nonurgent issues Please page for urgent issues To page the attending provider between 7A-7P or the covering provider during after hours 7P-7A, please log into the web site [email or web address] and access using universal [place] password for that web site. If you do not have the password, please call the hospital operator.    11/10/2021, 6:19 PM

## 2021-11-10 NOTE — NC FL2 (Signed)
[place] LEVEL OF CARE SCREENING TOOL     IDENTIFICATION  Patient Name: [name] [name] Birthdate: 08-30-1926 Sex: male Admission Date (Current Location): 11/02/2021  [facility] and [place] Number:  Herbalist and Address:  [facility], [place] [address] [place], [place] [address]      Provider Number: [id number]  Attending Physician Name and Address:  Florencia Reasons, MD  Relative Name and Phone Number:       Current Level of Care: Hospital Recommended Level of Care: [place] Prior Approval Number:    Date Approved/Denied:   PASRR Number: [id number]  Discharge Plan: SNF    Current Diagnoses: Patient Active Problem List   Diagnosis Date Noted   Osteomyelitis of second toe of left foot ([place]) 11/07/2021   Gangrene of toe of left foot ([place]) 11/07/2021   Wound infection 11/02/2021   Skin ulcers of foot, bilateral ([place]) 11/02/2021   PAD (peripheral artery disease) ([place]) 11/02/2021   History of CVA with residual deficit 11/02/2021   Left middle cerebral artery stroke ([place]) 05/31/2021   Acute CVA (cerebrovascular accident) ([place]) 05/25/2021   DNR (do not resuscitate) 05/25/2021   Osteoporosis 05/05/2021   Gouty arthritis 02/13/2021   Morbid obesity ([place]) 02/13/2021   Abdominal aortic aneurysm without rupture ([place]) 11/22/2020   Acute renal failure syndrome ([place]) 11/22/2020   Low back pain 11/22/2020   Skin tear of right upper arm without complication 68/34/1962   Pressure injury of skin 10/23/2020   Urinary tract infection with hematuria 10/22/2020   Encephalopathy 10/22/2020   AKI (acute kidney injury) ([place])    Overweight (BMI 25.0-29.9) 08/19/2020   Vertebral fracture, osteoporotic ([place]) 08/18/2020   T8 vertebral fracture ([place]) 08/17/2020   Thrombocytopenia ([place]) 08/17/2020   CKD (chronic kidney disease) stage 3, GFR 30-59 ml/min (HCC) 08/17/2020   Elevated troponin 08/17/2020   Pain due to onychomycosis of toenails  of both feet 07/27/2020   Callus of heel 07/27/2020   Bifascicular block 11/11/2019   Chronic diastolic CHF (congestive heart failure) ([place]) 11/10/2019   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   Severe aortic stenosis 09/29/2019   Aortic valve disorder 22/97/9892   Systolic heart failure ([place]) 03/11/2019   Thrombophilia ([place]) 02/26/2019   Muscle pain 02/11/2019   Weakness 02/06/2019   Polymyalgia rheumatica ([place]) 02/06/2019   Shoulder joint pain 02/02/2019   Anemia 01/26/2019   Malignant melanoma of skin ([place]) 01/26/2019   Pain in right leg 01/26/2019   DDD (degenerative disc disease), cervical 05/28/2018   Encounter for general adult medical examination without abnormal findings 11/19/2017   Carotid artery occlusion 07/03/2017   Hearing loss 07/03/2017   Fall 06/19/2017   Injury of upper extremity 06/19/2017   Neck pain 06/19/2017   Headache 06/19/2017   Visual disturbance 06/19/2017   Ganglion of hand 05/08/2017   Dilatation of aorta ([place]) 02/14/2017   Plantar fascial fibromatosis 02/14/2017   Cardiac murmur, unspecified 02/27/2016   Benign neoplasm of colon 08/24/2015   Dementia ([place]) 08/24/2015   Influenza due to unidentified influenza virus with other respiratory manifestations 03/15/2015   Epistaxis 12/01/2014   OSA (obstructive sleep apnea) 11/18/2014   Gastro-esophageal reflux disease with esophagitis 10/29/2014   Angina pectoris ([place]) 05/04/2014   Abnormal feces 07/31/2013   Other specified disorders of gingiva and edentulous alveolar ridge 07/31/2013   Spontaneous ecchymosis 10/10/2012   Thoracic ascending aortic aneurysm ([place]) 09/29/2012   Microscopic hematuria 04/11/2012   Proteinuria 04/11/2012  S/P laparoscopic cholecystectomy 04/09/2012   S/P CABG x 3 03/06/2012   S/P Maze operation for atrial fibrillation 03/06/2012   Paroxysmal atrial fibrillation ([place]) 02/29/2012   Bitten or stung by nonvenomous insect and other nonvenomous  arthropods, initial encounter 12/03/2011   Depression screening 10/17/2011   CAD (coronary artery disease), native coronary artery    Hyperlipidemia    Essential hypertension    Chronic venous insufficiency    Gout    Benign prostatic hyperplasia    Post-traumatic stress disorder 03/13/2011   Pain in left leg 12/08/2010   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   Fatigue 08/08/2010   Retention of urine 04/18/2010   Constipation 07/05/2009   Kidney stone 07/05/2009   ED (erectile dysfunction) of organic origin 12/14/2008   Impaired fasting glucose 12/14/2008   Vitamin B deficiency 12/14/2008    Orientation RESPIRATION BLADDER Height & Weight     Self, Time, Situation, Place  Normal External catheter, Continent Weight: 216 lb 0.8 oz (98 kg) Height:  '6\' 2"'$  (188 cm)  BEHAVIORAL SYMPTOMS/MOOD NEUROLOGICAL BOWEL NUTRITION STATUS      Incontinent Diet (please see discharge summary)  AMBULATORY STATUS COMMUNICATION OF NEEDS Skin   Limited Assist Verbally Surgical wounds (non-pressure wound-toe, anterior Left & Right, non-pressure wound pretibial, Left, non-pressure wound distal Left, closed incision Left Foot, wound incision non-pressure pretibial proxial Right)                       Personal Care Assistance Level of Assistance  Bathing, Feeding, Dressing Bathing Assistance: Limited assistance Feeding assistance: Independent Dressing Assistance: Limited assistance     Functional Limitations Info  Sight, Hearing, Speech Sight Info: Adequate Hearing Info: Impaired Speech Info: Adequate    SPECIAL CARE FACTORS FREQUENCY  PT (By licensed PT), OT (By licensed OT)     PT Frequency: 5x per week OT Frequency: 5x per week            Contractures Contractures Info: Not present    Additional Factors Info  Code Status, Allergies Code Status Info: DNR Allergies Info: Amoxillon, Bee Venom, Avelox, duloxetine,Levitra, Morphine & Related, Penicillin G Sodium, testosterone,  Tizanidine, Viagra           Current Medications (11/10/2021):  This is the current hospital active medication list Current Facility-Administered Medications  Medication Dose Route Frequency Provider Last Rate Last Admin   0.9 %  sodium chloride infusion   Intravenous Continuous [name], MD 100 mL/hr at 11/06/21 0611 New Bag at 11/06/21 0611   0.9 %  sodium chloride infusion  250 mL Intravenous PRN [name], MD       0.9 %  sodium chloride infusion   Intravenous Continuous [name], MD   Stopped at 11/09/21 1306   acetaminophen (TYLENOL) tablet 650 mg  650 mg Oral Q4H PRN [name], MD   650 mg at 11/09/21 2106   albuterol (PROVENTIL) (2.5 MG/3ML) 0.083% nebulizer solution 2.5 mg  2.5 mg Nebulization Q6H PRN [name], MD       alum & mag hydroxide-simeth (MAALOX/MYLANTA) 200-200-20 MG/5ML suspension 15-30 mL  15-30 mL Oral Q2H PRN [name], MD       amiodarone (PACERONE) tablet 200 mg  200 mg Oral Daily [name], MD   200 mg at 11/09/21 9449   apixaban (ELIQUIS) tablet 2.5 mg  2.5 mg Oral BID Florencia Reasons, MD   2.5 mg at 11/09/21 2106   ascorbic acid (  VITAMIN C) tablet 1,000 mg  1,000 mg Oral Daily [name], MD   1,000 mg at 11/09/21 0930   aspirin EC tablet 81 mg  81 mg Oral Daily [name], MD   81 mg at 11/07/21 1006   atorvastatin (LIPITOR) tablet 40 mg  40 mg Oral Daily [name], MD   40 mg at 11/09/21 0929   bisacodyl (DULCOLAX) EC tablet 5 mg  5 mg Oral Daily PRN [name], MD       cholecalciferol (VITAMIN D3) 25 MCG (1000 UNIT) tablet 1,000 Units  1,000 Units Oral Daily [name], MD   1,000 Units at 11/09/21 0737   cyanocobalamin (VITAMIN B12) tablet 1,000 mcg  1,000 mcg Oral Daily [name], MD   1,000 mcg at 11/09/21 0930   cyclobenzaprine (FLEXERIL) tablet 5 mg  5 mg Oral QHS PRN [name], MD       docusate sodium (COLACE) capsule 100 mg  100 mg Oral BID [name], MD   100 mg at 11/08/21 2251    ezetimibe (ZETIA) tablet 10 mg  10 mg Oral Daily [name], MD   10 mg at 11/09/21 0929   feeding supplement (GLUCERNA SHAKE) (GLUCERNA SHAKE) liquid 237 mL  237 mL Oral TID BM Florencia Reasons, MD       finasteride (PROSCAR) tablet 5 mg  5 mg Oral Daily [name], MD   5 mg at 11/09/21 0931   gabapentin (NEURONTIN) capsule 200 mg  200 mg Oral BID [name], MD   200 mg at 11/09/21 2106   galantamine (RAZADYNE ER) 24 hr capsule 8 mg  8 mg Oral Q breakfast [name], MD   8 mg at 11/09/21 0934   guaiFENesin-dextromethorphan (ROBITUSSIN DM) 100-10 MG/5ML syrup 15 mL  15 mL Oral Q4H PRN [name], MD       hydrALAZINE (APRESOLINE) injection 10 mg  10 mg Intravenous Q4H PRN [name], MD       hydrALAZINE (APRESOLINE) injection 5 mg  5 mg Intravenous Q20 Min PRN [name], MD       HYDROcodone-acetaminophen (NORCO) 7.5-325 MG per tablet 1-2 tablet  1-2 tablet Oral Q4H PRN [name], MD       HYDROcodone-acetaminophen (NORCO/VICODIN) 5-325 MG per tablet 1-2 tablet  1-2 tablet Oral Q4H PRN [name], MD       isosorbide mononitrate (IMDUR) 24 hr tablet 30 mg  30 mg Oral Daily Florencia Reasons, MD       labetalol (NORMODYNE) injection 10 mg  10 mg Intravenous Q10 min PRN [name], MD       lidocaine (LIDODERM) 5 % 1 patch  1 patch Transdermal Q24H [name], MD   1 patch at 11/09/21 1301   magnesium citrate solution 1 Bottle  1 Bottle Oral Once PRN [name], MD       magnesium sulfate IVPB 2 g 50 mL  2 g Intravenous Daily PRN [name], MD       memantine [facility]) tablet 10 mg  10 mg Oral BID [name], MD   10 mg at 11/09/21 2106   metoprolol succinate (TOPROL-XL) 24 hr tablet 25 mg  25 mg Oral Daily [name], MD   25 mg at 11/09/21 0930   metoprolol tartrate (LOPRESSOR) injection 2-5 mg  2-5 mg Intravenous Q2H PRN [name],  MD       nitroGLYCERIN (NITROSTAT) SL tablet 0.4 mg  0.4 mg Sublingual Q5 Min x 3 PRN [name], MD        nutrition supplement (JUVEN) (JUVEN) powder packet 1 packet  1 packet Oral BID BM [name], MD   1 packet at 11/09/21 1301   ondansetron (ZOFRAN) injection 4 mg  4 mg Intravenous Q6H PRN [name], MD       pantoprazole (PROTONIX) EC tablet 40 mg  40 mg Oral Daily [name], MD   40 mg at 11/09/21 0929   phenol (CHLORASEPTIC) mouth spray 1 spray  1 spray Mouth/Throat PRN [name], MD       polyethylene glycol (MIRALAX / GLYCOLAX) packet 17 g  17 g Oral Daily [name], MD   17 g at 11/05/21 0934   polyethylene glycol (MIRALAX / GLYCOLAX) packet 17 g  17 g Oral Daily PRN [name], MD       potassium chloride (KLOR-CON M) CR tablet 10 mEq  10 mEq Oral Daily [name], MD   10 mEq at 11/09/21 0929   potassium chloride SA (KLOR-CON M) CR tablet 20-40 mEq  20-40 mEq Oral Daily PRN [name], MD       predniSONE (DELTASONE) tablet 3 mg  3 mg Oral Q breakfast [name], MD   3 mg at 11/09/21 0928   psyllium (HYDROCIL/METAMUCIL) 1 packet  1 packet Oral Daily [name], MD   1 packet at 11/09/21 0933   sodium chloride flush (NS) 0.9 % injection 3 mL  3 mL Intravenous Q12H [name], MD   3 mL at 11/05/21 2129   sodium chloride flush (NS) 0.9 % injection 3 mL  3 mL Intravenous Q12H [name], MD   3 mL at 11/09/21 2106   sodium chloride flush (NS) 0.9 % injection 3 mL  3 mL Intravenous PRN [name], MD       tamsulosin [facility]) capsule 0.4 mg  0.4 mg Oral QPC supper [name], MD   0.4 mg at 11/09/21 1830   traMADol (ULTRAM) tablet 25 mg  25 mg Oral Q6H PRN [name], MD   25 mg at 11/09/21 6226   zinc sulfate capsule 220 mg  220 mg Oral Daily [name], MD   220 mg at 11/09/21 0930     Discharge Medications: Please see discharge summary for a list of discharge medications.  Relevant Imaging Results:  Relevant Lab Results:   Additional Information SSN [id number]  Vinie Sill, LCSW

## 2021-11-10 NOTE — Progress Notes (Signed)
Upper extremity venous right study completed.  Preliminary results relayed to Erlinda Hong, MD.  See CV Proc for preliminary results report.   Darlin Coco, RDMS, RVT

## 2021-11-11 DIAGNOSIS — L97519 Non-pressure chronic ulcer of other part of right foot with unspecified severity: Secondary | ICD-10-CM | POA: Diagnosis not present

## 2021-11-11 DIAGNOSIS — L97529 Non-pressure chronic ulcer of other part of left foot with unspecified severity: Secondary | ICD-10-CM | POA: Diagnosis not present

## 2021-11-11 LAB — BASIC METABOLIC PANEL
Anion gap: 7 (ref 5–15)
BUN: 37 mg/dL — ABNORMAL HIGH (ref 8–23)
CO2: 20 mmol/L — ABNORMAL LOW (ref 22–32)
Calcium: 9.2 mg/dL (ref 8.9–10.3)
Chloride: 109 mmol/L (ref 98–111)
Creatinine, Ser: 1.38 mg/dL — ABNORMAL HIGH (ref 0.61–1.24)
GFR, Estimated: 48 mL/min — ABNORMAL LOW (ref >60)
Glucose, Bld: 127 mg/dL — ABNORMAL HIGH (ref 70–99)
Potassium: 4.7 mmol/L (ref 3.5–5.1)
Sodium: 136 mmol/L (ref 135–145)

## 2021-11-11 LAB — CBC
HCT: 29.5 % — ABNORMAL LOW (ref 39.0–52.0)
Hemoglobin: 9.2 g/dL — ABNORMAL LOW (ref 13.0–17.0)
MCH: 28.8 pg (ref 26.0–34.0)
MCHC: 31.2 g/dL (ref 30.0–36.0)
MCV: 92.5 fL (ref 80.0–100.0)
Platelets: 147 10*3/uL — ABNORMAL LOW (ref 150–400)
RBC: 3.19 MIL/uL — ABNORMAL LOW (ref 4.22–5.81)
RDW: 15.7 % — ABNORMAL HIGH (ref 11.5–15.5)
WBC: 7.3 10*3/uL (ref 4.0–10.5)
nRBC: 0 % (ref 0.0–0.2)

## 2021-11-11 MED ORDER — METOPROLOL SUCCINATE ER 25 MG PO TB24
12.5000 mg | ORAL_TABLET | Freq: Every day | ORAL | Status: DC
  Administered 2021-11-12 – 2021-11-14 (×3): 12.5 mg via ORAL
  Filled 2021-11-11 (×3): qty 1

## 2021-11-11 NOTE — TOC Progression Note (Signed)
Transition of Care [facility]) - Progression Note    Patient Details  Name: [name] [name] MRN: [id number] Date of Birth: 02-Apr-1926  [facility] of [facility]) CM/SW [place], LCSW Phone Number: 11/11/2021, 2:41 PM  Clinical Narrative:    CSW met with pt and pt's spouse and provided the medicare.gov list with the highlighted bed offers. They chose [facility].  CSW attempted to call Freda Munro about securing the bed offer however had to leave a message.  CSW confirmed pt's impending discharge therefore authorization was started for [facility] with a start date of 11/12/2021. Auth reference # [id number]. CSW uploaded clinicals to portal.  TOC team will continue to assist with discharge planning needs.     Expected Discharge Plan: [place] Barriers to Discharge: Continued Medical Work up, Ship broker, SNF Pending bed offer  Expected Discharge Plan and Services Expected Discharge Plan: [place] In-house Referral: Clinical Social Work                                             Social Determinants of Health (SDOH) Interventions    Readmission Risk Interventions   No data to display

## 2021-11-11 NOTE — Progress Notes (Signed)
Pt is currently afib. Pt HR dropped to 38. Currently 44 - low 50s. Asymptomatic. MD paged.  Daymon Larsen, RN

## 2021-11-11 NOTE — Progress Notes (Signed)
PROGRESS NOTE    [name] [name]  PFX:[id number] DOB: 04-Aug-1926 DOA: 11/02/2021 PCP: [name], MD     Brief Narrative:   [name] [name] is a 88 years old male with past medical history significant of hypertension, hyperlipidemia, CAD s/p CABG, PAF s/p maze on chronic anticoagulation, systolic CHF, severe AS s/p TAVR, CVA with residual deficit, PVD, PMR, AAA, OSA on CPAP presented to hospital with worsening of feet wounds.     Subjective:  Uneventful night, he is aaox3 this am He reports right arm pain, venous doppler , elevate right arm Reports chronic back pain and leg pain Reports poor appetite, urine is cloudy , he denies urinary symptoms, wife states he has back pain when he has UTI, will check urine culture He is now agreeing to snf placement Wife wants covid vaccine   Assessment and Plan:  Principal Problem:   Skin ulcers of foot, bilateral ([place]) Active Problems:   Paroxysmal atrial fibrillation (HCC)   Essential hypertension   Chronic diastolic CHF (congestive heart failure) (HCC)   CAD (coronary artery disease), native coronary artery   PAD (peripheral artery disease) ([place])   History of CVA with residual deficit   CKD (chronic kidney disease) stage 3, GFR 30-59 ml/min (HCC)   Polymyalgia rheumatica (HCC)   Dementia (HCC)   OSA (obstructive sleep apnea)   Osteomyelitis of second toe of left foot (HCC)   Gangrene of toe of left foot (HCC)    PAD with Skin ulcers of feet, bilateral, right heel pressure ulceration. -x ray no osteomyelitis -blood culture negative -Seen by vascular surgery , s/p Left femoropopliteal angioplasty and stenting (7x58m Eluvia) on 11/6, -s/p ray amputation  on 11/8 by Dr DSharol Given- plan to continue asa/statin, resumed eliquis post op  Apply iodine from the swabsticks or swab pads from clean utility to bilateral foot/toe wounds.  Allow to air dry. Do NOT apply foam dressings to the wounds.  If a dressing is preferred, use dry gauze. Post op  shoe SNF placement  PAF on chronic anticoagulation Was on heparin drip perioperatively, resumed eliquis post op In afib with intermittent bradycardia, decrease metoprolol with holding parameters, continue amiodarone,   Essential hypertension Bp stable on current regimen    Diastolic congestive heart failure Review of 2D echocardiogram from 05/2021 with left ventricle ejection fraction of  65 -70%.  Appears to be compensated at this time.   Home med torsemide has been on hold since admission, plan to restart in the next 1 to 2 days Currently appear dehydrated with poor oral intake  CAD S/p CABG -Continue Imdur and statin     History of CVA with residual deficit Patient suffered left middle cerebral artery stroke in 05/2021 with residual right hand weakness and difficulty speaking.  Continue supportive care.  Continue statins.   CKD stage IIIb Creatinine 1.58 which appears near patient's baseline.   Metabolic acidosis, start sodium bicarb supplement   history of rheumatoid arthritis/PMR Needing assistance with ADLs.  We will get PT evaluation.  Wheelchair-bound currently. On leflunomide and prednisone.  We will continue to hold leflunomide.  Continue prednisone.   Dementia Continue home meds Namenda and galantamine Today he is AAOX#3   OSA -Continue CPAP at night   FTT: has been wheelchair bound for the last two years SNF placement  Right forearm edema @ IV site Venous ultrasound  Pressure Injury 11/02/21 Heel Right Deep Tissue Pressure Injury - Purple or maroon localized area of discolored intact skin or  blood-filled blister due to damage of underlying soft tissue from pressure and/or shear. (Active)  11/02/21 (Initially identified 10/22/20; follow at wound clinic; wife doing dressings) 1606  Location: Heel  Location Orientation: Right  Staging: Deep Tissue Pressure Injury - Purple or maroon localized area of discolored intact skin or blood-filled blister due to damage of  underlying soft tissue from pressure and/or shear.  Wound Description (Comments):   Present on Admission: Yes      I have Reviewed nursing notes, Vitals, pain scores, I/o's, Lab results and  imaging results since pt's last encounter, details please see discussion above  I ordered the following labs:  Unresulted Labs (From admission, onward)     Start     Ordered   11/11/21 8502  Basic metabolic panel  Daily at 5am,   R     Question:  Specimen collection method  Answer:  Lab=Lab collect   11/10/21 1819   11/10/21 1012  Urine Culture  (Urine Culture)  Once,   R       Question:  Indication  Answer:  Flank Pain   11/10/21 1011   11/04/21 0500  CBC  Daily at 5am,   R      11/03/21 0723             DVT prophylaxis: apixaban (ELIQUIS) tablet 2.5 mg Start: 11/09/21 1400 SCD's Start: 11/08/21 2046 apixaban (ELIQUIS) tablet 2.5 mg   Code Status:   Code Status: DNR  Family Communication: wife at bedside  Disposition:   Dispo: The patient is from: home              Anticipated d/c is to: SNF              Anticipated d/c date is: monitor cr, urine culture, SNF if am labs stable and patient clinically stable , wife desires covid booster prior to discharge  Antimicrobials:   Anti-infectives (From admission, onward)    Start     Dose/Rate Route Frequency Ordered Stop   11/08/21 2200  ceFAZolin (ANCEF) IVPB 2g/100 mL premix        2 g 200 mL/hr over 30 Minutes Intravenous Every 8 hours 11/08/21 2045 11/09/21 0709   11/08/21 0945  ceFAZolin (ANCEF) IVPB 2g/100 mL premix        2 g 200 mL/hr over 30 Minutes Intravenous On call to O.R. 11/08/21 0856 11/08/21 1819           Objective: Vitals:   11/10/21 1940 11/10/21 2333 11/11/21 0420 11/11/21 0800  BP: (!) 142/63 (!) 132/53 126/61 126/66  Pulse: 61 65 62 64  Resp: '18 14 14 17  '$ Temp: 98.2 F (36.8 C) 98.7 F (37.1 C) 98.6 F (37 C) 97.7 F (36.5 C)  TempSrc: Oral Axillary Axillary Oral  SpO2: 94% 99% 98% 98%   Weight:      Height:        Intake/Output Summary (Last 24 hours) at 11/11/2021 1000 Last data filed at 11/11/2021 0900 Gross per 24 hour  Intake 1734.41 ml  Output 2450 ml  Net -715.59 ml   Filed Weights   11/02/21 1045 11/08/21 1612  Weight: 98 kg 98 kg    Examination:  General exam: aaox3 Respiratory system: Clear to auscultation. Respiratory effort normal. Cardiovascular system:  IRRR.  Gastrointestinal system: Abdomen is nondistended, soft and nontender.  Normal bowel sounds heard. Central nervous system: Alert and orientedx3 . Right and weakness, mild dysarthria from prior cva in 05/2021  Extremities:  left foot post op changes Skin: right heal wound presents on admission , right forearm edema Psychiatry: calm and cooperative    Data Reviewed: I have personally reviewed  labs and visualized  imaging studies since the last encounter and formulate the plan        Scheduled Meds:  amiodarone  200 mg Oral Daily   apixaban  2.5 mg Oral BID   vitamin C  1,000 mg Oral Daily   aspirin EC  81 mg Oral Daily   atorvastatin  40 mg Oral Daily   cholecalciferol  1,000 Units Oral Daily   vitamin B-12  1,000 mcg Oral Daily   docusate sodium  100 mg Oral BID   ezetimibe  10 mg Oral Daily   feeding supplement (GLUCERNA SHAKE)  237 mL Oral TID BM   finasteride  5 mg Oral Daily   gabapentin  200 mg Oral BID   galantamine  8 mg Oral Q breakfast   isosorbide mononitrate  30 mg Oral Daily   lidocaine  1 patch Transdermal Q24H   memantine  10 mg Oral BID   metoprolol succinate  25 mg Oral Daily   nutrition supplement (JUVEN)  1 packet Oral BID BM   pantoprazole  40 mg Oral Daily   polyethylene glycol  17 g Oral Daily   potassium chloride  10 mEq Oral Daily   predniSONE  3 mg Oral Q breakfast   psyllium  1 packet Oral Daily   sodium bicarbonate  650 mg Oral BID   sodium chloride flush  3 mL Intravenous Q12H   sodium chloride flush  3 mL Intravenous Q12H   tamsulosin  0.4  mg Oral QPC supper   zinc sulfate  220 mg Oral Daily   Continuous Infusions:  sodium chloride 100 mL/hr at 11/06/21 9983   sodium chloride     sodium chloride Stopped (11/09/21 1306)   magnesium sulfate bolus IVPB       LOS: 9 days     Florencia Reasons, MD PhD FACP [facility]  Available via Epic secure chat 7am-7pm for nonurgent issues Please page for urgent issues To page the attending provider between 7A-7P or the covering provider during after hours 7P-7A, please log into the web site [email or web address] and access using universal [place] password for that web site. If you do not have the password, please call the hospital operator.    11/11/2021, 10:00 AM

## 2021-11-12 DIAGNOSIS — L97519 Non-pressure chronic ulcer of other part of right foot with unspecified severity: Secondary | ICD-10-CM | POA: Diagnosis not present

## 2021-11-12 DIAGNOSIS — L97529 Non-pressure chronic ulcer of other part of left foot with unspecified severity: Secondary | ICD-10-CM | POA: Diagnosis not present

## 2021-11-12 LAB — CBC
HCT: 26.9 % — ABNORMAL LOW (ref 39.0–52.0)
Hemoglobin: 8.5 g/dL — ABNORMAL LOW (ref 13.0–17.0)
MCH: 28.9 pg (ref 26.0–34.0)
MCHC: 31.6 g/dL (ref 30.0–36.0)
MCV: 91.5 fL (ref 80.0–100.0)
Platelets: 149 10*3/uL — ABNORMAL LOW (ref 150–400)
RBC: 2.94 MIL/uL — ABNORMAL LOW (ref 4.22–5.81)
RDW: 15.7 % — ABNORMAL HIGH (ref 11.5–15.5)
WBC: 5.6 10*3/uL (ref 4.0–10.5)
nRBC: 0 % (ref 0.0–0.2)

## 2021-11-12 LAB — BASIC METABOLIC PANEL
Anion gap: 10 (ref 5–15)
BUN: 36 mg/dL — ABNORMAL HIGH (ref 8–23)
CO2: 20 mmol/L — ABNORMAL LOW (ref 22–32)
Calcium: 9.2 mg/dL (ref 8.9–10.3)
Chloride: 108 mmol/L (ref 98–111)
Creatinine, Ser: 1.37 mg/dL — ABNORMAL HIGH (ref 0.61–1.24)
GFR, Estimated: 48 mL/min — ABNORMAL LOW (ref >60)
Glucose, Bld: 117 mg/dL — ABNORMAL HIGH (ref 70–99)
Potassium: 4.4 mmol/L (ref 3.5–5.1)
Sodium: 138 mmol/L (ref 135–145)

## 2021-11-12 LAB — MAGNESIUM: Magnesium: 1.8 mg/dL (ref 1.7–2.4)

## 2021-11-12 MED ORDER — TORSEMIDE 20 MG PO TABS
40.0000 mg | ORAL_TABLET | Freq: Every day | ORAL | Status: DC
  Administered 2021-11-13 – 2021-11-14 (×2): 40 mg via ORAL
  Filled 2021-11-12 (×2): qty 2

## 2021-11-12 MED ORDER — SODIUM BICARBONATE 650 MG PO TABS
650.0000 mg | ORAL_TABLET | Freq: Two times a day (BID) | ORAL | Status: AC
  Administered 2021-11-12 – 2021-11-14 (×4): 650 mg via ORAL
  Filled 2021-11-12 (×4): qty 1

## 2021-11-12 MED ORDER — MAGNESIUM SULFATE 2 GM/50ML IV SOLN
2.0000 g | Freq: Once | INTRAVENOUS | Status: AC
  Administered 2021-11-12: 2 g via INTRAVENOUS
  Filled 2021-11-12: qty 50

## 2021-11-12 NOTE — Progress Notes (Signed)
Patient placed on home CPAP by Rn at bedside.

## 2021-11-12 NOTE — TOC Progression Note (Signed)
Transition of Care [facility]) - Progression Note    Patient Details  Name: [name] [name] MRN: [id number] Date of Birth: [birth year]  Transition of Care [facility]) CM/SW [place], LCSW Phone Number: 11/12/2021, 2:17 PM  Clinical Narrative:     Pt's Josem Kaufmann is still pending and Helene Kelp would need to be confirmed.  TOC team will continue to assist with discharge planning needs.   Expected Discharge Plan: [place] Barriers to Discharge: Continued Medical Work up, Ship broker, SNF Pending bed offer  Expected Discharge Plan and Services Expected Discharge Plan: [place] In-house Referral: Clinical Social Work                                             Social Determinants of Health (SDOH) Interventions    Readmission Risk Interventions   No data to display

## 2021-11-12 NOTE — Progress Notes (Signed)
PROGRESS NOTE    [name] [name]  KDT:[id number] DOB: Jan 30, 1926 DOA: 11/02/2021 PCP: [name], MD     Brief Narrative:   h/o  CAD s/p CABG, PAF s/p maze on chronic anticoagulation, systolic CHF ( improved), severe AS s/p TAVR, CVA with residual deficit, PVD, PMR, AAA, OSA on CPAP presented to hospital with worsening of feet wounds.     Subjective:  Uneventful night, he is aaox3 this am He reports right arm pain has improved some  Reports poor appetite,  He is now agreeing to snf placement Wife wants covid vaccine before discharge if can be done    Assessment and Plan:  Principal Problem:   Skin ulcers of foot, bilateral ([place]) Active Problems:   Paroxysmal atrial fibrillation (HCC)   Essential hypertension   Chronic diastolic CHF (congestive heart failure) (HCC)   CAD (coronary artery disease), native coronary artery   PAD (peripheral artery disease) ([place])   History of CVA with residual deficit   CKD (chronic kidney disease) stage 3, GFR 30-59 ml/min (HCC)   Polymyalgia rheumatica (HCC)   Dementia (HCC)   OSA (obstructive sleep apnea)   Osteomyelitis of second toe of left foot (HCC)   Gangrene of toe of left foot (HCC)    PAD with Skin ulcers of feet, bilateral, right heel pressure ulceration. -x ray no osteomyelitis -blood culture negative -s/p Left femoropopliteal angioplasty and stenting (7x23m Eluvia) on 11/6 by vascular surgery Dr. HStanford Breed-s/p ray amputation  on 11/8 by orthopedics Dr DSharol Given Nonweightbearing on the left , Dry dressing reinforce as needed , ontinue with the foam boots for both lower extremities.  -  continue asa/statin,  eliquis resumed post op  -SNF placement  Right forearm edema @ prior IV site Venous ultrasound acute superficial vein thrombosis involving the right basilic vein  and right cephalic vein.  Already on anticoagulation Elevate right arm   PAF on chronic anticoagulation Was on heparin drip perioperatively, resumed eliquis  post op In afib with intermittent bradycardia, decrease metoprolol with holding parameters, continue amiodarone,  Keep k >4, mag>2  History of CVA with residual deficit Patient suffered left middle cerebral artery stroke in 05/2021 with residual right hand weakness and difficulty speaking.  Continue supportive care.  Continue statins ,asa, eliquis  Essential hypertension Bp stable on current regimen    H/o combined congestive heart failure - echocardiogram from 05/2021 with left ventricle ejection fraction of  65 -70%. ( Reduced lvef in the past but appears has normalized)   -Home med torsemide has been on hold since admission, , has poor appetite, -  Appears to be compensated at this time.  restart prior to discharge, may need to change to every other day if oral intake remains poor   CAD S/p CABG -Continue Imdur and statin, asa, also on eliquis       CKD stage IIIb Creatinine 1.58 which appears near patient's baseline.   Metabolic acidosis, started sodium bicarb supplement   history of rheumatoid arthritis/PMR Needing assistance with ADLs.  We will get PT evaluation.  Wheelchair-bound currently. On leflunomide and prednisone.  We will continue to hold leflunomide.  Continue prednisone.   Dementia Continue home meds Namenda and galantamine  he is AAOX#3   OSA -Continue CPAP at night   FTT: has been wheelchair bound for the last two years SNF placement    Pressure Injury 11/02/21 Heel Right Deep Tissue Pressure Injury - Purple or maroon localized area of discolored intact skin or blood-filled blister  due to damage of underlying soft tissue from pressure and/or shear. (Active)  11/02/21 (Initially identified 10/22/20; follow at wound clinic; wife doing dressings) 1606  Location: Heel  Location Orientation: Right  Staging: Deep Tissue Pressure Injury - Purple or maroon localized area of discolored intact skin or blood-filled blister due to damage of underlying soft tissue  from pressure and/or shear.  Wound Description (Comments):   Present on Admission: Yes      I have Reviewed nursing notes, Vitals, pain scores, I/o's, Lab results and  imaging results since pt's last encounter, details please see discussion above  I ordered the following labs:  Unresulted Labs (From admission, onward)     Start     Ordered   11/11/21 8756  Basic metabolic panel  Daily at 5am,   R     Question:  Specimen collection method  Answer:  Lab=Lab collect   11/10/21 1819   11/04/21 0500  CBC  Daily at 5am,   R      11/03/21 0723             DVT prophylaxis: apixaban (ELIQUIS) tablet 2.5 mg Start: 11/09/21 1400 SCD's Start: 11/08/21 2046 apixaban (ELIQUIS) tablet 2.5 mg   Code Status:   Code Status: DNR  Family Communication: wife  Disposition:   Dispo: The patient is from: home              Anticipated d/c is to: SNF              Anticipated d/c date is: SNF , awaiting for insu auth , wife desires new  covid booster prior to discharge  Antimicrobials:   Anti-infectives (From admission, onward)    Start     Dose/Rate Route Frequency Ordered Stop   11/08/21 2200  ceFAZolin (ANCEF) IVPB 2g/100 mL premix        2 g 200 mL/hr over 30 Minutes Intravenous Every 8 hours 11/08/21 2045 11/09/21 0709   11/08/21 0945  ceFAZolin (ANCEF) IVPB 2g/100 mL premix        2 g 200 mL/hr over 30 Minutes Intravenous On call to O.R. 11/08/21 0856 11/08/21 1819           Objective: Vitals:   11/12/21 0800 11/12/21 0854 11/12/21 1259 11/12/21 1301  BP: (!) 113/56 (!) 141/62 (!) 119/101 (!) 131/55  Pulse:  65 66   Resp:  18 18   Temp:  98.1 F (36.7 C) 98.3 F (36.8 C)   TempSrc:  Axillary Axillary   SpO2:  99% 98%   Weight:      Height:        Intake/Output Summary (Last 24 hours) at 11/12/2021 1448 Last data filed at 11/12/2021 1058 Gross per 24 hour  Intake 240 ml  Output 2300 ml  Net -2060 ml   Filed Weights   11/02/21 1045 11/08/21 1612  Weight: 98 kg  98 kg    Examination:  General exam: aaox3 Respiratory system: Clear to auscultation. Respiratory effort normal. Cardiovascular system:  IRRR.  Gastrointestinal system: Abdomen is nondistended, soft and nontender.  Normal bowel sounds heard. Central nervous system: Alert and orientedx3 . Right hand weakness, mild dysarthria from prior cva in 05/2021  Extremities:  left foot post op changes Skin: right heal wound presents on admission , right forearm edema Psychiatry: calm and cooperative    Data Reviewed: I have personally reviewed  labs and visualized  imaging studies since the last encounter and formulate the plan  Scheduled Meds:  amiodarone  200 mg Oral Daily   apixaban  2.5 mg Oral BID   vitamin C  1,000 mg Oral Daily   aspirin EC  81 mg Oral Daily   atorvastatin  40 mg Oral Daily   cholecalciferol  1,000 Units Oral Daily   vitamin B-12  1,000 mcg Oral Daily   docusate sodium  100 mg Oral BID   ezetimibe  10 mg Oral Daily   feeding supplement (GLUCERNA SHAKE)  237 mL Oral TID BM   finasteride  5 mg Oral Daily   gabapentin  200 mg Oral BID   galantamine  8 mg Oral Q breakfast   isosorbide mononitrate  30 mg Oral Daily   lidocaine  1 patch Transdermal Q24H   memantine  10 mg Oral BID   metoprolol succinate  12.5 mg Oral Daily   nutrition supplement (JUVEN)  1 packet Oral BID BM   pantoprazole  40 mg Oral Daily   polyethylene glycol  17 g Oral Daily   potassium chloride  10 mEq Oral Daily   predniSONE  3 mg Oral Q breakfast   psyllium  1 packet Oral Daily   sodium chloride flush  3 mL Intravenous Q12H   sodium chloride flush  3 mL Intravenous Q12H   tamsulosin  0.4 mg Oral QPC supper   zinc sulfate  220 mg Oral Daily   Continuous Infusions:  sodium chloride     magnesium sulfate bolus IVPB     magnesium sulfate bolus IVPB       LOS: 10 days     Florencia Reasons, MD PhD FACP [facility]  Available via Epic secure chat 7am-7pm for nonurgent  issues Please page for urgent issues To page the attending provider between 7A-7P or the covering provider during after hours 7P-7A, please log into the web site [email or web address] and access using universal [place] password for that web site. If you do not have the password, please call the hospital operator.    11/12/2021, 2:48 PM

## 2021-11-13 ENCOUNTER — Telehealth: Payer: Self-pay

## 2021-11-13 DIAGNOSIS — L97519 Non-pressure chronic ulcer of other part of right foot with unspecified severity: Secondary | ICD-10-CM | POA: Diagnosis not present

## 2021-11-13 DIAGNOSIS — L97529 Non-pressure chronic ulcer of other part of left foot with unspecified severity: Secondary | ICD-10-CM | POA: Diagnosis not present

## 2021-11-13 LAB — CBC
HCT: 24.6 % — ABNORMAL LOW (ref 39.0–52.0)
Hemoglobin: 7.7 g/dL — ABNORMAL LOW (ref 13.0–17.0)
MCH: 28.8 pg (ref 26.0–34.0)
MCHC: 31.3 g/dL (ref 30.0–36.0)
MCV: 92.1 fL (ref 80.0–100.0)
Platelets: 143 10*3/uL — ABNORMAL LOW (ref 150–400)
RBC: 2.67 MIL/uL — ABNORMAL LOW (ref 4.22–5.81)
RDW: 15.9 % — ABNORMAL HIGH (ref 11.5–15.5)
WBC: 6.5 10*3/uL (ref 4.0–10.5)
nRBC: 0 % (ref 0.0–0.2)

## 2021-11-13 LAB — BASIC METABOLIC PANEL
Anion gap: 8 (ref 5–15)
BUN: 40 mg/dL — ABNORMAL HIGH (ref 8–23)
CO2: 21 mmol/L — ABNORMAL LOW (ref 22–32)
Calcium: 9.3 mg/dL (ref 8.9–10.3)
Chloride: 109 mmol/L (ref 98–111)
Creatinine, Ser: 1.43 mg/dL — ABNORMAL HIGH (ref 0.61–1.24)
GFR, Estimated: 46 mL/min — ABNORMAL LOW (ref >60)
Glucose, Bld: 123 mg/dL — ABNORMAL HIGH (ref 70–99)
Potassium: 4.1 mmol/L (ref 3.5–5.1)
Sodium: 138 mmol/L (ref 135–145)

## 2021-11-13 NOTE — TOC Progression Note (Signed)
Transition of Care [facility]) - Progression Note    Patient Details  Name: [name] [name] MRN: [id number] Date of Birth: April 04, 1926  Transition of Care [facility]) CM/SW [place],  Phone Number: 11/13/2021, 10:19 AM  Clinical Narrative:     Insurance still pending   Expected Discharge Plan: [place] Barriers to Discharge: Continued Medical Work up, Ship broker, SNF Pending bed offer  Expected Discharge Plan and Services Expected Discharge Plan: [place] In-house Referral: Clinical Social Work                                             Social Determinants of Health (SDOH) Interventions    Readmission Risk Interventions     No data to display

## 2021-11-13 NOTE — Plan of Care (Signed)

## 2021-11-13 NOTE — Progress Notes (Signed)
Physical Therapy Treatment Patient Details Name: [name] [name] MRN: [id number] DOB: 14-Mar-1926 Today's Date: 11/13/2021   History of Present Illness Pt is 88 y/o M admitted to [facility] on 11/02/21 with B foot wounds getting worse. 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 PMH to include HTN, HLD, CAD s/p CABG, PAF, CHF, CVA, PFD, AAA.    PT Comments    Pt steady progressing with mobility. Today's session focused on transfer techniques, improved static and dynamic sitting balance. Pt able to slide board transfer to R with minA for slide board placement and cues to maintain WB precautions. Unable to achieve full upright position max+2 with sit to stand. Pt remains limited by generalized weakness, decreased activity tolerance, and impaired balance strategies/postural reactions. Continue to recommend acute PT services to maximize functional mobility and independence prior to d/c to SNF level therapies.   Recommendations for follow up therapy are one component of a multi-disciplinary discharge planning process, led by the attending physician.  Recommendations may be updated based on patient status, additional functional criteria and insurance authorization.  Follow Up Recommendations  Skilled nursing-short term rehab (<3 hours/day) Can patient physically be transported by private vehicle: No   Assistance Recommended at Discharge Frequent or constant Supervision/Assistance  Patient can return home with the following Two people to help with walking and/or transfers;A lot of help with bathing/dressing/bathroom;Assist for transportation;Help with stairs or ramp for entrance;Assistance with cooking/housework   Equipment Recommendations  Other (comment) (Slide board)    Recommendations for Other Services       Precautions / Restrictions Precautions Precautions: Fall Restrictions Weight Bearing Restrictions: Yes LLE Weight Bearing: Non weight bearing Other Position/Activity Restrictions: B foot wounds; Per Dr.  Jess Barters note: pt "ideally needs to be NWB on LLE. Partial weightbearing to facilitate ambulation is most likely necessary."     Mobility  Bed Mobility Overal bed mobility: Needs Assistance Bed Mobility: Rolling, Sidelying to Sit Rolling: Min assist Sidelying to sit: +2 for safety/equipment, Mod assist       General bed mobility comments: attempt at log rolling technqiue to reduce onset of back pain throughout session; pt able to roll to R side with use of handrail and required modA+2 for LEs and trunk to achieve full upright postiion    Transfers Overall transfer level: Needs assistance Equipment used: Sliding board Transfers: Bed to chair/wheelchair/BSC, Sit to/from Stand Sit to Stand: Max assist, +2 physical assistance          Lateral/Scoot Transfers: Min assist General transfer comment: three trials sit to stand unsuccessful for full upright position, able to clear buttock but pt continued to move hands to RW too quickly even with verbal cues to correct behavior; lateral scoot via slide board to right minA for slide board placement and placement of LEs    Ambulation/Gait                   Stairs             Wheelchair Mobility    Modified Rankin (Stroke Patients Only)       Balance Overall balance assessment: Needs assistance Sitting-balance support: Bilateral upper extremity supported, Feet supported, Single extremity supported Sitting balance-Leahy Scale: Fair Sitting balance - Comments: improved sitting EOB min guard with righting reactions observed during slide board placement with dynamic weight shifting and no LOB     Standing balance-Leahy Scale: Zero  Cognition Arousal/Alertness: Awake/alert Behavior During Therapy: WFL for tasks assessed/performed Overall Cognitive Status: Impaired/Different from baseline Area of Impairment: Following commands, Safety/judgement, Awareness, Problem solving                        Following Commands: Follows one step commands consistently, Follows one step commands with increased time Safety/Judgement: Decreased awareness of safety, Decreased awareness of deficits Awareness: Emergent Problem Solving: Slow processing, Difficulty sequencing, Requires verbal cues, Requires tactile cues General Comments: cues throughout session for pt to maintain NWB precautions for LLE; continues to demonstrate likely baseline cognition of alert + oriented with challenge for multistep processing        Exercises      General Comments General comments (skin integrity, edema, etc.): VSS on RA      Pertinent Vitals/Pain Pain Assessment Faces Pain Scale: Hurts even more Pain Location: low back Pain Descriptors / Indicators: Aching, Moaning, Discomfort Pain Intervention(s): Monitored during session, Repositioned    Home Living                          Prior Function            PT Goals (current goals can now be found in the care plan section) Acute Rehab PT Goals Patient Stated Goal: to decrease pain PT Goal Formulation: With patient Time For Goal Achievement: 11/23/21 Potential to Achieve Goals: Good Progress towards PT goals: Progressing toward goals    Frequency    Min 2X/week      PT Plan Current plan remains appropriate    Co-evaluation              AM-PAC PT "6 Clicks" Mobility   Outcome Measure  Help needed turning from your back to your side while in a flat bed without using bedrails?: A Lot Help needed moving from lying on your back to sitting on the side of a flat bed without using bedrails?: Total Help needed moving to and from a bed to a chair (including a wheelchair)?: A Little Help needed standing up from a chair using your arms (e.g., wheelchair or bedside chair)?: Total Help needed to walk in hospital room?: Total Help needed climbing 3-5 steps with a railing? : Total 6 Click Score: 9    End of  Session Equipment Utilized During Treatment: Gait belt Activity Tolerance: Patient tolerated treatment well;Patient limited by fatigue Patient left: with call bell/phone within reach;with family/visitor present;in chair;with chair alarm set Nurse Communication: Mobility status;Need for lift equipment PT Visit Diagnosis: Other abnormalities of gait and mobility (R26.89);Muscle weakness (generalized) (M62.81)     Time: 0093-8182 PT Time Calculation (min) (ACUTE ONLY): 30 min  Charges:  $Therapeutic Activity: 23-37 mins                    [name] [name], SPT    [place] [name] [name] 11/13/2021, 2:17 PM

## 2021-11-13 NOTE — Progress Notes (Signed)
Pt placed self on home cpap machine.

## 2021-11-13 NOTE — Progress Notes (Signed)
PROGRESS NOTE  [name] [name]  DOB: 1926-02-22  PCP: [name], MD WGN:[id number]  DOA: 11/02/2021  LOS: 12 days  Hospital Day: 13  Brief narrative: [name] [name] is a 88 y.o. male with PMH significant for HTN, HLD, CAD s/p CABG, PAF s/p maze on chronic anticoagulation, systolic CHF (improved), severe AS s/p TAVR, CVA with residual deficit, PVD, PMR, AAA, OSA on CPAP 11/2, patient presented to hospital with worsening of feet wounds.  He was noted to have necrotic ulcers over the PIP joint left foot second toe with exposed joint and purulent drainage. X-ray did not suggest osteomyelitis. Admitted to [facility]. S/p endovascular revascularization by vascular surgery Seen by Dr. Sharol Given See below for details   Subjective: Patient was seen and examined this morning.  Elderly Caucasian male.  Lying on bed.  Not in distress.  He is asking about dressing changes.  No family at bedside. Chart reviewed In the last 24 hours, no fever, hemodynamically stable, breathing on room air. Last set of labs from this morning with normal WBC count, hemoglobin low at 7.7, platelet low at 143, creatinine trending up at 1.43  Assessment and plan: Necrotic ulcer left foot second toe S/p left foot second ray amputation -11/8 Dr. Sharol Given.   Recommended nonweightbearing, dry dressing reinforcement as needed  Right heel decubitus ulcer Right foot second toe ulcer Continue protective boot.  PAD bilateral lower extremities s/p Left femoropopliteal angioplasty and stenting (7x91m Eluvia) on 11/6 by vascular surgery Dr. HStanford Breedcontinue asa/statin,  eliquis resumed post op  PT recommended SNF placement  CAD s/p CABG Continue Imdur, Eliquis, aspirin, statin.  History of CVA with residual deficit Patient suffered left middle cerebral artery stroke in 05/2021 with residual right hand weakness and difficulty speaking.   Continue supportive care.   Continue statin, asa, eliquis  PAF on chronic anticoagulation Was on  heparin drip perioperatively, resumed eliquis post op In afib with intermittent bradycardia. Continue amiodarone. Continue metoprolol at decreased dose.   H/o combined congestive heart failure Essential hypertension echocardiogram from 05/2021 with left ventricle ejection fraction of  65 -70%. (Reduced lvef in the past but appears has normalized)   PTA on Imdur 60 mg daily, torsemide 40 mg daily, Blood pressure currently stable on Imdur.  Torsemide on hold currently.  CKD 3B Metabolic acidosis Creatinine remains at baseline less than 1.6. Continue sodium bicarb tablets for metabolic acidosis Recent Labs    11/04/21 0233 11/06/21 0242 11/07/21 0137 11/08/21 0206 11/09/21 0138 11/10/21 0106 11/11/21 0039 11/12/21 0040 11/13/21 0057 11/14/21 0120  BUN '19 16 15 17 18 '$ 29* 37* 36* 40* 42*  CREATININE 1.35* 1.35* 1.45* 1.53* 1.60* 1.50* 1.38* 1.37* 1.43* 1.52*  CO2 24 20* 21* 20* 19* 20* 20* 20* 21* 23   Chronic anemia Hemoglobin at baseline between 8 and 9.  Gradually downtrending in last 24 hours.  Continue to monitor. Recent Labs    04/28/21 1123 04/28/21 1124 05/04/21 1406 11/10/21 0106 11/11/21 0039 11/12/21 0040 11/13/21 0057 11/14/21 0120  HGB 9.2*  --    < > 8.0* 9.2* 8.5* 7.7* 7.7*  MCV 90.6  --    < > 92.1 92.5 91.5 92.1 89.3  VITAMINB12 683  --   --   --   --   --   --   --   FERRITIN  --  99  --   --   --   --   --   --   TIBC 367  --   --   --   --   --   --   --  IRON 87  --   --   --   --   --   --   --    < > = values in this interval not displayed.   BPH Continue Flomax 0.4 mg daily and finasteride 5 mg daily    Acute superficial vein thrombosis of right basilic vein and cephalic vein At the site of previous IV access.  Findings noted in ultrasound duplex obtained for swelling Already on anticoagulation Keep right arm elevated.   History of rheumatoid arthritis/polymyalgia rheumatica Wheelchair-bound for last 2 years. PTA on leflunomide 10 mg  daily????, prednisone 3 mg daily. Continue prednisone. Resume leflunomide at discharge.    history of rheumatoid arthritis/PMR Needing assistance with ADLs.  We will get PT evaluation.  Wheelchair-bound currently. On leflunomide and prednisone.  We will continue to hold leflunomide.  Continue prednisone.    Dementia Alert, awake, oriented x3 at this time.  Continue home meds Namenda and galantamine   OSA Continue CPAP at night  Goals of care   Code Status: DNR   Mobility: Wheelchair-bound for last 2 years  Skin assessment:  Pressure Injury 11/02/21 Heel Right Deep Tissue Pressure Injury - Purple or maroon localized area of discolored intact skin or blood-filled blister due to damage of underlying soft tissue from pressure and/or shear. (Active)  11/02/21 (Initially identified 10/22/20; follow at wound clinic; wife doing dressings) 1606  Location: Heel  Location Orientation: Right  Staging: Deep Tissue Pressure Injury - Purple or maroon localized area of discolored intact skin or blood-filled blister due to damage of underlying soft tissue from pressure and/or shear.  Wound Description (Comments):   Present on Admission: Yes    Nutritional status:  Body mass index is 27.74 kg/m.          Diet:  Diet Order             Diet Carb Modified Fluid consistency: Thin; Room service appropriate? Yes  Diet effective now                    DVT prophylaxis:  apixaban (ELIQUIS) tablet 2.5 mg Start: 11/09/21 1400 SCD's Start: 11/08/21 2046 apixaban (ELIQUIS) tablet 2.5 mg   Antimicrobials: None Fluid: None Consultants: Dr. Sharol Given Family Communication: None at bedside   Status is: Inpatient   Continue in-hospital care because: Anxious otherwise pending for discharge Level of care: Progressive Cardiac    Dispo: The patient is from: Home              Anticipated d/c is to: SNF              Patient currently is medically stable to d/c.                 Difficult to place  patient No   Infusions:   sodium chloride     magnesium sulfate bolus IVPB      Scheduled Meds:  amiodarone  200 mg Oral Daily   apixaban  2.5 mg Oral BID   vitamin C  1,000 mg Oral Daily   aspirin EC  81 mg Oral Daily   atorvastatin  40 mg Oral Daily   cholecalciferol  1,000 Units Oral Daily   vitamin B-12  1,000 mcg Oral Daily   docusate sodium  100 mg Oral BID   ezetimibe  10 mg Oral Daily   feeding supplement (GLUCERNA SHAKE)  237 mL Oral TID BM   finasteride  5 mg Oral Daily  gabapentin  200 mg Oral BID   galantamine  8 mg Oral Q breakfast   isosorbide mononitrate  30 mg Oral Daily   lidocaine  1 patch Transdermal Q24H   memantine  10 mg Oral BID   metoprolol succinate  12.5 mg Oral Daily   nutrition supplement (JUVEN)  1 packet Oral BID BM   pantoprazole  40 mg Oral Daily   polyethylene glycol  17 g Oral Daily   potassium chloride  10 mEq Oral Daily   predniSONE  3 mg Oral Q breakfast   psyllium  1 packet Oral Daily   sodium chloride flush  3 mL Intravenous Q12H   sodium chloride flush  3 mL Intravenous Q12H   tamsulosin  0.4 mg Oral QPC supper   torsemide  40 mg Oral Daily   zinc sulfate  220 mg Oral Daily    PRN meds: sodium chloride, acetaminophen, albuterol, alum & mag hydroxide-simeth, bisacodyl, cyclobenzaprine, guaiFENesin-dextromethorphan, hydrALAZINE, hydrALAZINE, HYDROcodone-acetaminophen, HYDROcodone-acetaminophen, labetalol, magnesium citrate, magnesium sulfate bolus IVPB, metoprolol tartrate, nitroGLYCERIN, ondansetron, phenol, polyethylene glycol, potassium chloride, sodium chloride flush, traMADol   Antimicrobials: Anti-infectives (From admission, onward)    Start     Dose/Rate Route Frequency Ordered Stop   11/08/21 2200  ceFAZolin (ANCEF) IVPB 2g/100 mL premix        2 g 200 mL/hr over 30 Minutes Intravenous Every 8 hours 11/08/21 2045 11/09/21 0709   11/08/21 0945  ceFAZolin (ANCEF) IVPB 2g/100 mL premix        2 g 200 mL/hr over 30 Minutes  Intravenous On call to O.R. 11/08/21 0856 11/08/21 1819       Objective: Vitals:   11/14/21 1000 11/14/21 1155  BP:  (!) 111/58  Pulse: 69 69  Resp: 20 17  Temp:  98 F (36.7 C)  SpO2: 100% 98%    Intake/Output Summary (Last 24 hours) at 11/14/2021 1426 Last data filed at 11/14/2021 1300 Gross per 24 hour  Intake 1200 ml  Output 3600 ml  Net -2400 ml   Filed Weights   11/02/21 1045 11/08/21 1612  Weight: 98 kg 98 kg   Weight change:  Body mass index is 27.74 kg/m.   Physical Exam:  General exam: Pleasant elderly Caucasian male.  Not in physical distress Skin: No rashes, lesions or ulcers. HEENT: Atraumatic, normocephalic, no obvious bleeding Lungs: Clear to auscultation bilaterally CVS: Regular rate and rhythm, no murmur GI/Abd soft, nontender, nondistended, bowel sound present CNS: alert, awake, oriented X 3  Psychiatry: mood appropriate Extremities: Chronic bilateral lower extremity lymphedema, ulcers on boots.  Data Review: I have personally reviewed the laboratory data and studies available.    Signed, Terrilee Croak, MD [facility]

## 2021-11-13 NOTE — Progress Notes (Signed)
Orthopedic Tech Progress Note Patient Details:  [name] [name] Sep 14, 1926 [id number]  Fitted patient with heel bearing shoe, I stopped and talked to MOB to let her know my concerns about patient using shoe. RN was helping with an emergency with different patient   Ortho Devices Type of Ortho Device: Darco shoe Ortho Device/Splint Location: LLE Ortho Device/Splint Interventions: Ordered, Application, Removal   Post Interventions Patient Tolerated: Well Instructions Provided: Care of device  Janit Pagan 11/13/2021, 12:13 PM

## 2021-11-13 NOTE — TOC Progression Note (Signed)
Transition of Care [facility]) - Progression Note    Patient Details  Name: [name] [name] MRN: [id number] Date of Birth: 1926-08-20  Transition of Care [facility]) CM/SW [place], [place] Phone Number: 11/13/2021, 5:05 PM  Clinical Narrative:     Sent updated PT note as requested by insurance - insurance is pending   Thurmond Butts, MSW, LCSW Clinical Social Worker    Expected Discharge Plan: Skilled Nursing Facility Barriers to Discharge: Continued Medical Work up, Ship broker, SNF Pending bed offer  Expected Discharge Plan and Services Expected Discharge Plan: [place] In-house Referral: Clinical Social Work                                             Social Determinants of Health (SDOH) Interventions    Readmission Risk Interventions     No data to display

## 2021-11-13 NOTE — Telephone Encounter (Signed)
this pt is admitted to [facility] but they are d/c to [facility], they are requesting Dr. Danise Mina nurse give them a call regarding his injections and labs and if they can get those done at [facility] without coming here, please call his wife's number thanks.  Called pt's wife and explained since pt has not been discharged we will have to wait until he receives his discharge instructions.  His hospital stay and surgery can change the date and times of labs and injections.  Wife with [facility] and will reach out to the nurse here after pt's discharge.

## 2021-11-14 ENCOUNTER — Other Ambulatory Visit: Payer: Medicare Other

## 2021-11-14 ENCOUNTER — Ambulatory Visit: Payer: Medicare Other

## 2021-11-14 DIAGNOSIS — L97519 Non-pressure chronic ulcer of other part of right foot with unspecified severity: Secondary | ICD-10-CM | POA: Diagnosis not present

## 2021-11-14 DIAGNOSIS — L97529 Non-pressure chronic ulcer of other part of left foot with unspecified severity: Secondary | ICD-10-CM | POA: Diagnosis not present

## 2021-11-14 LAB — CBC WITH DIFFERENTIAL/PLATELET
Abs Immature Granulocytes: 0.04 10*3/uL (ref 0.00–0.07)
Basophils Absolute: 0.1 10*3/uL (ref 0.0–0.1)
Basophils Relative: 1 %
Eosinophils Absolute: 0.1 10*3/uL (ref 0.0–0.5)
Eosinophils Relative: 1 %
HCT: 24.2 % — ABNORMAL LOW (ref 39.0–52.0)
Hemoglobin: 7.7 g/dL — ABNORMAL LOW (ref 13.0–17.0)
Immature Granulocytes: 1 %
Lymphocytes Relative: 14 %
Lymphs Abs: 0.9 10*3/uL (ref 0.7–4.0)
MCH: 28.4 pg (ref 26.0–34.0)
MCHC: 31.8 g/dL (ref 30.0–36.0)
MCV: 89.3 fL (ref 80.0–100.0)
Monocytes Absolute: 0.8 10*3/uL (ref 0.1–1.0)
Monocytes Relative: 13 %
Neutro Abs: 4.4 10*3/uL (ref 1.7–7.7)
Neutrophils Relative %: 70 %
Platelets: 169 10*3/uL (ref 150–400)
RBC: 2.71 MIL/uL — ABNORMAL LOW (ref 4.22–5.81)
RDW: 15.8 % — ABNORMAL HIGH (ref 11.5–15.5)
WBC: 6.2 10*3/uL (ref 4.0–10.5)
nRBC: 0 % (ref 0.0–0.2)

## 2021-11-14 LAB — BASIC METABOLIC PANEL
Anion gap: 12 (ref 5–15)
BUN: 42 mg/dL — ABNORMAL HIGH (ref 8–23)
CO2: 23 mmol/L (ref 22–32)
Calcium: 9.5 mg/dL (ref 8.9–10.3)
Chloride: 102 mmol/L (ref 98–111)
Creatinine, Ser: 1.52 mg/dL — ABNORMAL HIGH (ref 0.61–1.24)
GFR, Estimated: 42 mL/min — ABNORMAL LOW (ref >60)
Glucose, Bld: 125 mg/dL — ABNORMAL HIGH (ref 70–99)
Potassium: 3.9 mmol/L (ref 3.5–5.1)
Sodium: 137 mmol/L (ref 135–145)

## 2021-11-14 MED ORDER — TRAMADOL HCL 50 MG PO TABS: 25.0000 mg | ORAL_TABLET | Freq: Four times a day (QID) | ORAL | 0 refills | Status: DC | PRN

## 2021-11-14 MED ORDER — ALBUTEROL SULFATE (2.5 MG/3ML) 0.083% IN NEBU: 2.5000 mg | INHALATION_SOLUTION | Freq: Four times a day (QID) | RESPIRATORY_TRACT | 12 refills | Status: DC | PRN

## 2021-11-14 MED ORDER — GUAIFENESIN-DM 100-10 MG/5ML PO SYRP: 15.0000 mL | ORAL_SOLUTION | ORAL | 0 refills | Status: DC | PRN

## 2021-11-14 MED ORDER — LIDOCAINE 5 % EX PTCH: 1.0000 | MEDICATED_PATCH | CUTANEOUS | 0 refills | Status: DC

## 2021-11-14 MED ORDER — GLUCERNA SHAKE PO LIQD: 237.0000 mL | Freq: Three times a day (TID) | ORAL | 0 refills | Status: DC

## 2021-11-14 MED ORDER — JUVEN PO PACK: 1.0000 | PACK | Freq: Two times a day (BID) | ORAL | 0 refills | Status: DC

## 2021-11-14 MED ORDER — METOPROLOL SUCCINATE ER 25 MG PO TB24: 12.5000 mg | ORAL_TABLET | Freq: Every day | ORAL | Status: DC

## 2021-11-14 MED ORDER — BISACODYL 5 MG PO TBEC: 5.0000 mg | DELAYED_RELEASE_TABLET | Freq: Every day | ORAL | 0 refills | Status: DC | PRN

## 2021-11-14 MED ORDER — ZINC SULFATE 220 (50 ZN) MG PO CAPS: 220.0000 mg | ORAL_CAPSULE | Freq: Every day | ORAL | Status: DC

## 2021-11-14 MED ORDER — ALUM & MAG HYDROXIDE-SIMETH 200-200-20 MG/5ML PO SUSP: 15.0000 mL | ORAL | 0 refills | Status: DC | PRN

## 2021-11-14 MED ORDER — ASPIRIN 81 MG PO TBEC: 81.0000 mg | DELAYED_RELEASE_TABLET | Freq: Every day | ORAL | 12 refills | Status: DC

## 2021-11-14 NOTE — Discharge Summary (Signed)
Physician Discharge Summary  WINDSOR GOEKEN GMW:[id number] DOB: 1926/12/06 DOA: 11/02/2021  PCP: [name] Infante, MD  Admit date: 11/02/2021 Discharge date: 11/14/2021  Admitted From: Home Discharge disposition: SNF  Recommendations at discharge:  Recommended nonweightbearing, dry dressing reinforcement as needed on the left Continue protective boot on both   Brief narrative: [name] [name] is a 88 y.o. male with PMH significant for HTN, HLD, CAD s/p CABG, PAF s/p maze on chronic anticoagulation, systolic CHF (improved), severe AS s/p TAVR, CVA with residual deficit, PVD, PMR, AAA, OSA on CPAP 11/2, patient presented to hospital with worsening of feet wounds.  He was noted to have necrotic ulcers over the PIP joint left foot second toe with exposed joint and purulent drainage. X-ray did not suggest osteomyelitis. Admitted to [facility]. S/p endovascular revascularization by vascular surgery Seen by Dr. Sharol [name] See below for details   Subjective: Patient was seen and examined this morning.   Elderly Caucasian male.  Sitting up in recliner.  Not in distress.  Wife at bedside.    Hospital course: Necrotic ulcer left foot second toe S/p left foot second ray amputation -11/8 Dr. Sharol [name].   Recommended nonweightbearing, dry dressing reinforcement as needed  Right heel decubitus ulcer Right foot second toe ulcer Continue protective boot.  PAD bilateral lower extremities s/p Left femoropopliteal angioplasty and stenting (7x29m Eluvia) on 11/6 by vascular surgery Dr. HStanford Breedcontinue asa/statin,  eliquis resumed post op  PT recommended SNF placement  CAD s/p CABG Continue Imdur, Eliquis, aspirin, statin.  History of CVA with residual deficit Patient suffered left middle cerebral artery stroke in 05/2021 with residual right hand weakness and difficulty speaking.   Continue supportive care.   Continue statins ,asa, eliquis  PAF on chronic anticoagulation Was on heparin drip perioperatively,  resumed eliquis post op In afib with intermittent bradycardia. Continue amiodarone.  Continue metoprolol at decreased dose.   H/o combined congestive heart failure Essential hypertension echocardiogram from 05/2021 with left ventricle ejection fraction of  65 -70%. (Reduced lvef in the past but appears has normalized)   PTA on Imdur 60 mg daily, torsemide 40 mg daily, Blood pressure currently stable on Imdur.  Torsemide on hold currently.  Plan to resume at discharge.  CKD 3B Metabolic acidosis Creatinine remains at baseline less than 1.6. Recent Labs    11/04/21 0233 11/06/21 0242 11/07/21 0137 11/08/21 0206 11/09/21 0138 11/10/21 0106 11/11/21 0039 11/12/21 0040 11/13/21 0057 11/14/21 0120  BUN '19 16 15 17 18 '$ 29* 37* 36* 40* 42*  CREATININE 1.35* 1.35* 1.45* 1.53* 1.60* 1.50* 1.38* 1.37* 1.43* 1.52*  CO2 24 20* 21* 20* 19* 20* 20* 20* 21* 23   Chronic anemia Hemoglobin at baseline between 8 and 9.  Gradually downtrending in last 24 hours.  Continue to monitor. Recent Labs    04/28/21 1123 04/28/21 1124 05/04/21 1406 11/10/21 0106 11/11/21 0039 11/12/21 0040 11/13/21 0057 11/14/21 0120  HGB 9.2*  --    < > 8.0* 9.2* 8.5* 7.7* 7.7*  MCV 90.6  --    < > 92.1 92.5 91.5 92.1 89.3  VITAMINB12 683  --   --   --   --   --   --   --   FERRITIN  --  99  --   --   --   --   --   --   TIBC 367  --   --   --   --   --   --   --  IRON 87  --   --   --   --   --   --   --    < > = values in this interval not displayed.   BPH Continue Flomax 0.4 mg daily and finasteride 5 mg daily    Acute superficial vein thrombosis of right basilic vein and cephalic vein At the site of previous IV access.  Findings noted in ultrasound duplex obtained for swelling Already on anticoagulation Keep right arm elevated.   History of rheumatoid arthritis/polymyalgia rheumatica Wheelchair-bound for last 2 years. PTA on leflunomide 10 mg daily????, prednisone 3 mg daily. Continue prednisone.   Resume leflunomide at discharge.    history of rheumatoid arthritis/PMR Needing assistance with ADLs.  We will get PT evaluation.  Wheelchair-bound currently. On leflunomide and prednisone.  We will continue to hold leflunomide.  Continue prednisone.    Dementia Alert, awake, oriented x3 at this time.  Continue home meds Namenda and galantamine   OSA Continue CPAP at night  Wounds:  - Pressure Injury 11/02/21 Heel Right Deep Tissue Pressure Injury - Purple or maroon localized area of discolored intact skin or blood-filled blister due to damage of underlying soft tissue from pressure and/or shear. (Active)  Date First Assessed/Time First Assessed: (c) 11/02/21 1606   Location: Heel  Location Orientation: Right  Staging: Deep Tissue Pressure Injury - Purple or maroon localized area of discolored intact skin or blood-filled blister due to damage of underly...    Assessments 05/26/2021 10:00 AM 11/14/2021  9:30 AM  Dressing Type -- Gauze (Comment);Other (Comment)  Dressing -- Clean, Dry, Intact  Site / Wound Assessment -- Painful;Purple  % Wound base Red or Granulating 100% --  Wound Length (cm) 2 cm --  Wound Width (cm) 5 cm --  Wound Depth (cm) 0.1 cm --  Wound Surface Area (cm^2) 10 cm^2 --  Wound Volume (cm^3) 1 cm^3 --     No associated orders.     Wound / Incision (Open or Dehisced) 05/26/21 Other (Comment) Ankle Right full thickness wound (Active)  Date First Assessed: 05/26/21   Wound Type: Other (Comment)  Location: Ankle  Location Orientation: Right  Wound Description (Comments): full thickness wound  Present on Admission: Yes    Assessments 05/26/2021 10:00 AM 06/12/2021  5:00 PM  Dressing Type -- Foam - Lift dressing to assess site every shift  Dressing Changed -- Changed  Dressing Status -- Clean, Dry, Intact  Dressing Change Frequency -- Monday, Wednesday, Friday  Site / Wound Assessment -- Pink;Yellow;Red  % Wound base Red or Granulating 100% --  Peri-wound Assessment  -- Intact  Wound Length (cm) 1.5 cm --  Wound Width (cm) 0.3 cm --  Wound Depth (cm) 0.2 cm --  Wound Volume (cm^3) 0.09 cm^3 --  Wound Surface Area (cm^2) 0.45 cm^2 --  Margins -- Unattached edges (unapproximated)  Drainage Amount -- Scant  Drainage Description -- Serosanguineous     No associated orders.     Wound / Incision (Open or Dehisced) 11/02/21 Non-pressure wound Pretibial Proximal;Right (Active)  Date First Assessed/Time First Assessed: 11/02/21 1610   Wound Type: Non-pressure wound  Location: Pretibial  Location Orientation: Proximal;Right  Present on Admission: Yes    Assessments 11/02/2021  3:59 PM 11/14/2021  9:30 AM  Dressing Type Foam - Lift dressing to assess site every shift --  Dressing Changed Changed --  Dressing Status Clean, Dry, Intact Clean, Dry, Intact  Site / Wound Assessment -- Pink;Red  Drainage Amount  Scant --  Treatment Cleansed --     No associated orders.     Wound / Incision (Open or Dehisced) 11/02/21 Non-pressure wound Toe (Comment  which one) Anterior;Left (Active)  Date First Assessed/Time First Assessed: 11/02/21 1611   Wound Type: Non-pressure wound  Location: (c) Toe (Comment  which one)  Location Orientation: Anterior;Left  Present on Admission: Yes    Assessments 11/02/2021  3:59 PM 11/14/2021  1:00 PM  Dressing Type Foam - Lift dressing to assess site every shift --  Dressing Changed New Changed  Dressing Status Clean, Dry, Intact --  Drainage Amount None --  Treatment Cleansed --     No associated orders.     Wound / Incision (Open or Dehisced) 11/02/21 Non-pressure wound Toe (Comment  which one) Anterior;Right (Active)  Date First Assessed/Time First Assessed: 11/02/21 1612   Wound Type: Non-pressure wound  Location: (c) Toe (Comment  which one)  Location Orientation: Anterior;Right  Present on Admission: Yes    Assessments 11/02/2021  3:59 PM 11/14/2021  9:30 AM  Dressing Type Foam - Lift dressing to assess site every shift --   Dressing Changed New --  Dressing Status Clean, Dry, Intact Clean, Dry, Intact  Site / Wound Assessment -- Pink;Red  Closure -- None  Drainage Amount None --  Treatment Cleansed --     No associated orders.     Incision (Closed) 11/08/21 Foot Left (Active)  Date First Assessed/Time First Assessed: 11/08/21 1800   Location: Foot  Location Orientation: Left    Assessments 11/08/2021  6:40 PM 11/14/2021  1:00 PM  Dressing Type Gauze (Comment);Compression wrap --  Dressing Clean, Dry, Intact Changed  Site / Wound Assessment Dressing in place / Unable to assess --  Drainage Amount None --     No associated orders.    Discharge Exam:   Vitals:   11/14/21 0357 11/14/21 0900 11/14/21 1000 11/14/21 1155  BP: (!) 141/56 122/74  (!) 111/58  Pulse: 68 68 69 69  Resp: '19 13 20 17  '$ Temp: 98.3 F (36.8 C) 97.6 F (36.4 C)  98 F (36.7 C)  TempSrc: Oral Oral  Oral  SpO2: 96% 97% 100% 98%  Weight:      Height:        Body mass index is 27.74 kg/m.  General exam: Pleasant elderly Caucasian male.  Not in physical distress Skin: No rashes, lesions or ulcers. HEENT: Atraumatic, normocephalic, no obvious bleeding Lungs: Clear to auscultation bilaterally CVS: Regular rate and rhythm, no murmur GI/Abd soft, nontender, nondistended, bowel sound present CNS: alert, awake, oriented X 3  Psychiatry: mood appropriate Extremities: Chronic bilateral lower extremity lymphedema, ulcers on boots.  Follow ups:    Follow-up Information     Cherre Robins, MD Follow up in 4 week(s).   Specialties: Vascular Surgery, Interventional Cardiology Why: Office will call you to arrange your appt (sent). Contact information: [address] [place] [address] [phone]         [name], MD Follow up in 1 week(s).   Specialty: Orthopedic Surgery Contact information: [place] [address] [phone]         [name] Infante, MD Follow up.   Specialty: Internal  Medicine Contact information: [place] [address] [phone]                 Discharge Instructions:   Discharge Instructions     Call MD for:  difficulty breathing, headache or visual  disturbances   Complete by: As directed    Call MD for:  extreme fatigue   Complete by: As directed    Call MD for:  hives   Complete by: As directed    Call MD for:  persistant dizziness or light-headedness   Complete by: As directed    Call MD for:  persistant nausea and vomiting   Complete by: As directed    Call MD for:  severe uncontrolled pain   Complete by: As directed    Call MD for:  temperature >100.4   Complete by: As directed    Diet general   Complete by: As directed    Discharge instructions   Complete by: As directed    Recommendations at discharge:   Recommended nonweightbearing, dry dressing reinforcement as needed on the left  Continue protective boot on both  General discharge instructions: Follow with Primary MD [name] Infante, MD in 7 days  Please request your PCP  to go over your hospital tests, procedures, radiology results at the follow up. Please get your medicines reviewed and adjusted.  Your PCP may decide to repeat certain labs or tests as needed. Do not drive, operate heavy machinery, perform activities at heights, swimming or participation in water activities or provide baby sitting services if your were admitted for syncope or siezures until you have seen by Primary MD or a Neurologist and advised to do so again. [place] Controlled Substance Reporting System database was reviewed. Do not drive, operate heavy machinery, perform activities at heights, swim, participate in water activities or provide baby-sitting services while on medications for pain, sleep and mood until your outpatient physician has reevaluated you and advised to do so again.  You are strongly recommended to comply with the dose, frequency and duration of prescribed  medications. Activity: As tolerated with Full fall precautions use walker/cane & assistance as needed Avoid using any recreational substances like cigarette, tobacco, alcohol, or non-prescribed drug. If you experience worsening of your admission symptoms, develop shortness of breath, life threatening emergency, suicidal or homicidal thoughts you must seek medical attention immediately by calling 911 or calling your MD immediately  if symptoms less severe. You must read complete instructions/literature along with all the possible adverse reactions/side effects for all the medicines you take and that have been prescribed to you. Take any new medicine only after you have completely understood and accepted all the possible adverse reactions/side effects.  Wear Seat belts while driving. You were cared for by a hospitalist during your hospital stay. If you have any questions about your discharge medications or the care you received while you were in the hospital after you are discharged, you can call the unit and ask to speak with the hospitalist or the covering physician. Once you are discharged, your primary care physician will handle any further medical issues. Please note that NO REFILLS for any discharge medications will be authorized once you are discharged, as it is imperative that you return to your primary care physician (or establish a relationship with a primary care physician if you do not have one).   Discharge wound care:   Complete by: As directed    Increase activity slowly   Complete by: As directed        Discharge Medications:   Allergies as of 11/14/2021       Reactions   Amoxicillin Other (See Comments)   Headache, debilitated   Bee Venom Anaphylaxis   Avelox [moxifloxacin] Other (See Comments)  Unknown   Codeine Other (See Comments)   Unknown    Duloxetine Other (See Comments)   Unknown   Duloxetine Hcl Other (See Comments)   felt weird   Levitra [vardenafil] Other  (See Comments)   Unknown   Morphine And Related Other (See Comments)   hypotension   Penicillin G Sodium Other (See Comments)   Severe headache, fatigue "debilitated"   Testosterone Other (See Comments)   Unknown    Tizanidine Other (See Comments)   Unknown    Viagra [sildenafil] Other (See Comments)   didn't like it        Medication List     STOP taking these medications    amLODipine 10 MG tablet Commonly known as: NORVASC   Retacrit 2000 UNIT/ML injection Generic drug: epoetin alfa-epbx   Santyl 250 UNIT/GM ointment Generic drug: collagenase       TAKE these medications    acetaminophen 325 MG tablet Commonly known as: TYLENOL Take 2 tablets (650 mg total) by mouth every 4 (four) hours as needed for mild pain (or temp > 37.5 C (99.5 F)). What changed: when to take this   albuterol (2.5 MG/3ML) 0.083% nebulizer solution Commonly known as: PROVENTIL Take 3 mLs (2.5 mg total) by nebulization every 6 (six) hours as needed for wheezing or shortness of breath.   alum & mag hydroxide-simeth 200-200-20 MG/5ML suspension Commonly known as: MAALOX/MYLANTA Take 15-30 mLs by mouth every 2 (two) hours as needed for indigestion.   amiodarone 200 MG tablet Commonly known as: PACERONE Take 1 tablet (200 mg total) by mouth daily.   ascorbic acid 500 MG tablet Commonly known as: VITAMIN C Take 1 tablet (500 mg total) by mouth daily.   aspirin EC 81 MG tablet Take 1 tablet (81 mg total) by mouth daily. Swallow whole. Start taking on: November 15, 2021   atorvastatin 40 MG tablet Commonly known as: Lipitor Take 1 tablet (40 mg total) by mouth daily.   bisacodyl 5 MG EC tablet Commonly known as: DULCOLAX Take 1 tablet (5 mg total) by mouth daily as needed for moderate constipation.   cyclobenzaprine 5 MG tablet Commonly known as: FLEXERIL Take 1 tablet (5 mg total) by mouth 3 (three) times daily as needed for muscle spasms. What changed: when to take this    denosumab 60 MG/ML Sosy injection Commonly known as: PROLIA Inject 60 mg into the skin every 6 (six) months.   docusate sodium 100 MG capsule Commonly known as: COLACE Take 1 capsule (100 mg total) by mouth 2 (two) times daily.   Eliquis 2.5 MG Tabs tablet Generic drug: apixaban Take 2.5 mg by mouth 2 (two) times daily.   EPINEPHrine 0.3 mg/0.3 mL Soaj injection Commonly known as: EPI-PEN Inject 0.3 mg into the muscle as needed for anaphylaxis.   ezetimibe 10 MG tablet Commonly known as: ZETIA Take 1 tablet (10 mg total) by mouth daily.   feeding supplement (GLUCERNA SHAKE) Liqd Take 237 mLs by mouth 3 (three) times daily between meals.   nutrition supplement (JUVEN) Pack Take 1 packet by mouth 2 (two) times daily between meals. Start taking on: November 15, 2021   finasteride 5 MG tablet Commonly known as: PROSCAR Take 1 tablet (5 mg total) by mouth daily.   fish oil-omega-3 fatty acids 1000 MG capsule Take 1 capsule (1 g total) by mouth daily.   gabapentin 100 MG capsule Commonly known as: NEURONTIN Take 2 capsules by mouth in the morning, 1 capsule in the  evening, and 2 capsules at bedtime. What changed: additional instructions   galantamine 8 MG 24 hr capsule Commonly known as: RAZADYNE ER Take 1 capsule (8 mg total) by mouth daily with breakfast.   guaiFENesin-dextromethorphan 100-10 MG/5ML syrup Commonly known as: ROBITUSSIN DM Take 15 mLs by mouth every 4 (four) hours as needed for cough.   isosorbide mononitrate 60 MG 24 hr tablet Commonly known as: IMDUR Take 1 tablet (60 mg total) by mouth daily.   leflunomide 20 MG tablet Commonly known as: ARAVA Take 1/2 tablets (10 mg total) by mouth daily.   lidocaine 5 % Commonly known as: LIDODERM Place 1 patch onto the skin daily. Remove & Discard patch within 12 hours or as directed by MD Start taking on: November 15, 2021   Magnesium 250 MG Tabs Take 1 tablet (250 mg total) by mouth daily.    memantine 10 MG tablet Commonly known as: NAMENDA Take 1 tablet (10 mg total) by mouth 2 (two) times daily.   metoprolol succinate 25 MG 24 hr tablet Commonly known as: TOPROL-XL Take 0.5 tablets (12.5 mg total) by mouth daily. Start taking on: November 15, 2021 What changed: See the new instructions.   MiraLax 17 g packet Generic drug: polyethylene glycol Take 17 g by mouth daily.   nitroGLYCERIN 0.4 MG SL tablet Commonly known as: Nitrostat Place 1 tablet (0.4 mg total) under the tongue every 5 (five) minutes x 3 doses as needed for chest pain.   pantoprazole 40 MG tablet Commonly known as: PROTONIX Take 40 mg by mouth daily.   potassium chloride 10 MEQ tablet Commonly known as: KLOR-CON M Take 1 tablet by mouth everyday with furosemide What changed:  how much to take how to take this when to take this additional instructions   predniSONE 1 MG tablet Commonly known as: DELTASONE Take 3 tablets (3 mg total) by mouth daily with breakfast.   Psyllium Husk Powd Take 1 Scoop by mouth daily.   tamsulosin 0.4 MG Caps capsule Commonly known as: FLOMAX Take 1 capsule (0.4 mg total) by mouth daily after supper.   torsemide 20 MG tablet Commonly known as: DEMADEX Take 2 tablets (40 mg total) by mouth daily.   traMADol 50 MG tablet Commonly known as: ULTRAM Take 0.5 tablets (25 mg total) by mouth every 6 (six) hours as needed for up to 5 days for moderate pain. What changed:  how much to take when to take this   triamcinolone cream 0.1 % Commonly known as: KENALOG Apply 1 Application topically as needed (rash/itching).   Vitamin B-12 5000 MCG Tbdp Take 5,000 mcg by mouth daily.   vitamin D3 25 MCG tablet Commonly known as: CHOLECALCIFEROL Take 1 tablet (1,000 Units total) by mouth daily.   zinc sulfate 220 (50 Zn) MG capsule Take 1 capsule (220 mg total) by mouth daily. Start taking on: November 15, 2021               Discharge Care Instructions   (From admission, onward)           Start     Ordered   11/14/21 0000  Discharge wound care:        11/14/21 1433             The results of significant diagnostics from this hospitalization (including imaging, microbiology, ancillary and laboratory) are listed below for reference.    Procedures and Diagnostic Studies:   VAS [place] ABI WITH/WO TBI  Result Date: 11/02/2021  LOWER EXTREMITY DOPPLER STUDY Patient Name:  [name] [name]  Date of Exam:   11/02/2021 Medical Rec #: [id number]      Accession #:    [id number] Date of Birth: [birth year]       Patient Gender: M Patient Age:   59 years Exam Location:  [facility] Procedure:      VAS [place] ABI WITH/WO TBI Referring Phys: Merleen Nicely FORD --------------------------------------------------------------------------------  Indications: Ulceration. High Risk Factors: Hypertension, hyperlipidemia, past history of smoking,                    coronary artery disease, prior CVA. Other Factors: S/P CABG x 3, S/P TAVR, CKD, AAA, Afib.  Vascular Interventions: Angiogram and posterior tibial balloon angioplasty of                         RLE (10/26/20). Comparison Study: Previous exam 03/24/21 WNL Performing Technologist: Rogelia Rohrer RVT, RDMS  Examination Guidelines: A complete evaluation includes at minimum, Doppler waveform signals and systolic blood pressure reading at the level of bilateral brachial, anterior tibial, and posterior tibial arteries, when vessel segments are accessible. Bilateral testing is considered an integral part of a complete examination. Photoelectric Plethysmograph (PPG) waveforms and toe systolic pressure readings are included as required and additional duplex testing as needed. Limited examinations for reoccurring indications may be performed as noted.  ABI Findings: +---------+------------------+-----+----------+--------+ Right    Rt Pressure (mmHg)IndexWaveform  Comment  +---------+------------------+-----+----------+--------+  Brachial 167                    triphasic          +---------+------------------+-----+----------+--------+ PTA      103               0.62 monophasic         +---------+------------------+-----+----------+--------+ DP       109               0.65 monophasic         +---------+------------------+-----+----------+--------+ Great Toe75                0.45 Abnormal           +---------+------------------+-----+----------+--------+ +---------+------------------+-----+----------+-------+ Left     Lt Pressure (mmHg)IndexWaveform  Comment +---------+------------------+-----+----------+-------+ Brachial 167                    triphasic         +---------+------------------+-----+----------+-------+ PTA      203               1.22 monophasic        +---------+------------------+-----+----------+-------+ DP       158               0.95 monophasic        +---------+------------------+-----+----------+-------+ Great Toe52                0.31 Abnormal          +---------+------------------+-----+----------+-------+ +-------+-----------+-----------+------------+------------+ ABI/TBIToday's ABIToday's TBIPrevious ABIPrevious TBI +-------+-----------+-----------+------------+------------+ Right  0.65       0.45       1.09        0.74         +-------+-----------+-----------+------------+------------+ Left   1.22       0.31       1.19        0.76         +-------+-----------+-----------+------------+------------+ Bilateral TBIs appear decreased.  Right ABIs appear decreased.  Summary: Right: Resting right ankle-brachial index indicates moderate right lower extremity arterial disease. The right toe-brachial index is abnormal. Left: The left toe-brachial index is abnormal. Although ankle brachial indices are within normal limits (0.95-1.29), arterial Doppler waveforms at the ankle suggest some component of arterial occlusive disease. *See table(s) above for  measurements and observations.  Electronically signed by Jamelle Haring on 11/02/2021 at 4:05:01 PM.    Final    DG Foot Complete Right  Result Date: 11/02/2021 CLINICAL DATA:  Infection.  Second toe. EXAM: RIGHT FOOT COMPLETE - 3+ VIEW COMPARISON:  None Available. FINDINGS: There is a soft tissue abnormality along the tip of the second toe distal phalanx. There is no definite evidence of cortical irregularity on the provided views. No evidence of subcutaneous gas. There are enthesopathic changes at the calcaneus. There are multifocal degenerative changes at the forefoot and hindfoot. Vascular calcifications are present. IMPRESSION: Soft tissue abnormality along the tip of the second toe distal phalanx without definite evidence of cortical irregularity. However, [name] clinical concerns for osseous extension, osteomyelitis is not excluded. Consider further evaluation with MRI if more definitive characterization is clinically warranted. Electronically Signed   By: Marin Roberts M.D.   On: 11/02/2021 12:41   DG Foot Complete Left  Result Date: 11/02/2021 CLINICAL DATA:  Second toe wound. EXAM: LEFT FOOT - COMPLETE 3+ VIEW COMPARISON:  October 05, 2021. FINDINGS: There is no evidence of fracture or dislocation. There is no evidence of arthropathy or other focal bone abnormality. Soft tissues are unremarkable. IMPRESSION: No acute abnormality seen in the left foot. No definite lytic destruction is seen to suggest osteomyelitis. Electronically Signed   By: Marijo Conception M.D.   On: 11/02/2021 11:54   DG Chest 2 View  Result Date: 11/02/2021 CLINICAL DATA:  Possible sepsis with second toe wound, initial encounter EXAM: CHEST - 2 VIEW COMPARISON:  10/22/2020 FINDINGS: Cardiac shadow is stable. Postsurgical changes are noted as well as prior TAVR. The lungs are well aerated bilaterally. No focal infiltrate or sizable effusion is noted. Changes of prior vertebral augmentation at L1 are noted. IMPRESSION: No active  cardiopulmonary disease. Electronically Signed   By: Inez Catalina M.D.   On: 11/02/2021 11:16     Labs:   Basic Metabolic Panel: Recent Labs  Lab 11/09/21 0138 11/10/21 0106 11/11/21 0039 11/12/21 0040 11/13/21 0057 11/14/21 0120  NA 134* 133* 136 138 138 137  K 3.6 3.8 4.7 4.4 4.1 3.9  CL 103 106 109 108 109 102  CO2 19* 20* 20* 20* 21* 23  GLUCOSE 147* 128* 127* 117* 123* 125*  BUN 18 29* 37* 36* 40* 42*  CREATININE 1.60* 1.50* 1.38* 1.37* 1.43* 1.52*  CALCIUM 9.2 9.0 9.2 9.2 9.3 9.5  MG 1.9  --   --  1.8  --   --    GFR Estimated Creatinine Clearance: 35.3 mL/min (A) (by C-G formula based on SCr of 1.52 mg/dL (H)). Liver Function Tests: No results for input(s): "AST", "ALT", "ALKPHOS", "BILITOT", "PROT", "ALBUMIN" in the last 168 hours. No results for input(s): "LIPASE", "AMYLASE" in the last 168 hours. No results for input(s): "AMMONIA" in the last 168 hours. Coagulation profile No results for input(s): "INR", "PROTIME" in the last 168 hours.  CBC: Recent Labs  Lab 11/10/21 0106 11/11/21 0039 11/12/21 0040 11/13/21 0057 11/14/21 0120  WBC 5.5 7.3 5.6 6.5 6.2  NEUTROABS  --   --   --   --  4.4  HGB 8.0* 9.2* 8.5* 7.7* 7.7*  HCT 25.8* 29.5* 26.9* 24.6* 24.2*  MCV 92.1 92.5 91.5 92.1 89.3  PLT 129* 147* 149* 143* 169   Cardiac Enzymes: No results for input(s): "CKTOTAL", "CKMB", "CKMBINDEX", "TROPONINI" in the last 168 hours. BNP: Invalid input(s): "POCBNP" CBG: No results for input(s): "GLUCAP" in the last 168 hours. D-Dimer No results for input(s): "DDIMER" in the last 72 hours. Hgb A1c No results for input(s): "HGBA1C" in the last 72 hours. Lipid Profile No results for input(s): "CHOL", "HDL", "LDLCALC", "TRIG", "CHOLHDL", "LDLDIRECT" in the last 72 hours. Thyroid function studies No results for input(s): "TSH", "T4TOTAL", "T3FREE", "THYROIDAB" in the last 72 hours.  Invalid input(s): "FREET3" Anemia work up No results for input(s): "VITAMINB12",  "FOLATE", "FERRITIN", "TIBC", "IRON", "RETICCTPCT" in the last 72 hours. Microbiology Recent Results (from the past 240 hour(s))  Surgical PCR screen     Status: None   Collection Time: 11/07/21  8:22 PM   Specimen: Nasal Mucosa; Nasal Swab  Result Value Ref Range Status   MRSA, PCR NEGATIVE NEGATIVE Final   Staphylococcus aureus NEGATIVE NEGATIVE Final    Comment: (NOTE) The Xpert SA Assay (FDA approved for NASAL specimens in patients 43 years of age and older), is one component of a comprehensive surveillance program. It is not intended to diagnose infection nor to guide or monitor treatment. Performed at [facility], [place] [address] [place], [place] [address]   Urine Culture     Status: Abnormal   Collection Time: 11/08/21  4:10 PM   Specimen: Urine, Clean Catch  Result Value Ref Range Status   Specimen Description URINE, CLEAN CATCH  Final   Special Requests   Final    NONE Performed at [facility], [place] [address], [place], [place] [address]    Culture MULTIPLE SPECIES PRESENT, SUGGEST RECOLLECTION (A)  Final   Report Status 11/09/2021 FINAL  Final    Time coordinating discharge: 35 minutes  Signed: Panagiotis Oelkers  [facility] 11/14/2021, 2:33 PM

## 2021-11-14 NOTE — Progress Notes (Signed)
Patient being discharged to [facility].  IV removed with the catheter intact.  Discharge instructions and prescription information, along with printed prescription placed in the packet for discharge.  Patient to be transported by [facility].

## 2021-11-14 NOTE — Progress Notes (Signed)
Occupational Therapy Treatment Patient Details Name: [name] [name] MRN: [id number] DOB: 03-09-1926 Today's Date: 11/14/2021   History of present illness Pt is 88 y/o M admitted to [facility] on 11/02/21 with B foot wounds getting worse. 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 PMH to include HTN, HLD, CAD s/p CABG, PAF, CHF, CVA, PFD, AAA.   OT comments  Patient supine in bed and agreeable to OT.  Complains of back pain, but eager to get OOB.  Patient completing bed mobility with mod assist, sitting EOB with min guard to supervision statically.  Assist to place slideboard under R hip and then scooting "down hill" towards R side into chair with max verbal cueing and mod physical assistance to ensure correct technique and NWB to R LE.  Plans to dc to SNF.  Will follow acutely.    Recommendations for follow up therapy are one component of a multi-disciplinary discharge planning process, led by the attending physician.  Recommendations may be updated based on patient status, additional functional criteria and insurance authorization.    Follow Up Recommendations  Skilled nursing-short term rehab (<3 hours/day)     Assistance Recommended at Discharge Frequent or constant Supervision/Assistance  Patient can return home with the following  Two people to help with walking and/or transfers;Two people to help with bathing/dressing/bathroom;Assistance with cooking/housework;Direct supervision/assist for medications management;Direct supervision/assist for financial management;Assist for transportation;Help with stairs or ramp for entrance   Equipment Recommendations  Other (comment) (defer)    Recommendations for Other Services      Precautions / Restrictions Precautions Precautions: Fall Restrictions Weight Bearing Restrictions: Yes LLE Weight Bearing: Non weight bearing Other Position/Activity Restrictions: B foot wounds; Per Dr. Jess Barters note: pt "ideally needs to be NWB on LLE. Partial weightbearing to  facilitate ambulation is most likely necessary."       Mobility Bed Mobility Overal bed mobility: Needs Assistance Bed Mobility: Rolling, Sidelying to Sit Rolling: Mod assist Sidelying to sit: Mod assist, HOB elevated       General bed mobility comments: log rolling to R side with mod assist, assist to bring BLEs towards EOB and guiding support of trunk to ascend.  Increased time and cueing for technique.    Transfers Overall transfer level: Needs assistance Equipment used: Sliding board Transfers: Bed to chair/wheelchair/BSC            Lateral/Scoot Transfers: Mod assist General transfer comment: lateral scoot towards R side "down hill" into recliner using slideboard.  Increased time and effort and cueing to ensure NWB to L LE.     Balance Overall balance assessment: Needs assistance Sitting-balance support: Bilateral upper extremity supported, Feet supported, Single extremity supported Sitting balance-Leahy Scale: Fair Sitting balance - Comments: min guard to close supervision statically, up to min assist with lateral leans                                   ADL either performed or assessed with clinical judgement   ADL Overall ADL's : Needs assistance/impaired     Grooming: Min guard;Sitting           Upper Body Dressing : Minimal assistance;Sitting   Lower Body Dressing: Total assistance;Sitting/lateral leans;Bed level   Toilet Transfer: Moderate assistance (lateral scoot, slideboard) Toilet Transfer Details (indicate cue type and reason): simulated to recliner         Functional mobility during ADLs: Moderate assistance;Cueing for safety;Cueing for sequencing  Extremity/Trunk Assessment              Vision       Perception     Praxis      Cognition Arousal/Alertness: Awake/alert Behavior During Therapy: WFL for tasks assessed/performed Overall Cognitive Status: Impaired/Different from baseline Area of Impairment:  Following commands, Safety/judgement, Awareness, Problem solving, Memory                     Memory: Decreased short-term memory Following Commands: Follows one step commands consistently, Follows one step commands with increased time Safety/Judgement: Decreased awareness of safety, Decreased awareness of deficits Awareness: Emergent Problem Solving: Slow processing, Difficulty sequencing, Requires verbal cues, Requires tactile cues General Comments: cues throughout session for pt to maintain NWB precautions for LLE; continues to demonstrate likely baseline cognition of alert + oriented with challenge for multistep processing        Exercises      Shoulder Instructions       General Comments VSS on RA    Pertinent Vitals/ Pain       Pain Assessment Pain Assessment: Faces Faces Pain Scale: Hurts even more Pain Location: low back Pain Descriptors / Indicators: Aching, Moaning, Discomfort Pain Intervention(s): Limited activity within patient's tolerance, Monitored during session, Repositioned, Other (comment) (RN notified)  Home Living                                          Prior Functioning/Environment              Frequency  Min 2X/week        Progress Toward Goals  OT Goals(current goals can now be found in the care plan section)  Progress towards OT goals: Progressing toward goals  Acute Rehab OT Goals Patient Stated Goal: get to rehab OT Goal Formulation: With patient Time For Goal Achievement: 11/23/21 Potential to Achieve Goals: Good  Plan Discharge plan remains appropriate;Frequency remains appropriate    Co-evaluation                 AM-PAC OT "6 Clicks" Daily Activity     Outcome Measure   Help from another person eating meals?: A Little Help from another person taking care of personal grooming?: A Little Help from another person toileting, which includes using toliet, bedpan, or urinal?: Total Help from  another person bathing (including washing, rinsing, drying)?: A Lot Help from another person to put on and taking off regular upper body clothing?: A Little Help from another person to put on and taking off regular lower body clothing?: Total 6 Click Score: 13    End of Session Equipment Utilized During Treatment: Gait belt;Other (comment) (slideboard)  OT Visit Diagnosis: Other abnormalities of gait and mobility (R26.89);Muscle weakness (generalized) (M62.81);Pain Pain - Right/Left: Left Pain - part of body: Ankle and joints of foot (back)   Activity Tolerance Patient tolerated treatment well   Patient Left in chair;with call bell/phone within reach;with chair alarm set;with family/visitor present   Nurse Communication Mobility status;Other (comment);Need for lift equipment (R heel dressing)        Time: 3299-2426 OT Time Calculation (min): 28 min  Charges: OT General Charges $OT Visit: 1 Visit OT Treatments $Self Care/Home Management : 23-37 mins  [place] Office [phone]   Delight Stare 11/14/2021, 12:48 PM

## 2021-11-14 NOTE — Plan of Care (Signed)
  Problem: Skin Integrity: Goal: Demonstration of wound healing without infection will improve 11/14/2021 1448 by Caroll Rancher, RN Outcome: Adequate for Discharge 11/14/2021 1447 by Caroll Rancher, RN Outcome: Adequate for Discharge   Problem: Acute Rehab PT Goals(only PT should resolve) Goal: Pt Will Go Supine/Side To Sit Outcome: Adequate for Discharge Goal: Patient Will Perform Sitting Balance Outcome: Adequate for Discharge Goal: Pt Will Transfer Bed To Chair/Chair To Bed Outcome: Adequate for Discharge   Problem: Acute Rehab OT Goals (only OT should resolve) Goal: Pt. Will Perform Grooming Outcome: Adequate for Discharge Goal: Pt. Will Transfer To Toilet Outcome: Adequate for Discharge Goal: Pt/Caregiver Will Perform Home Exercise Program Outcome: Adequate for Discharge Goal: OT Additional ADL Goal #1 Outcome: Adequate for Discharge

## 2021-11-14 NOTE — TOC Transition Note (Signed)
Transition of Care [facility]) - CM/SW Discharge Note   Patient Details  Name: [name] [name] MRN: [id number] Date of Birth: December 12, 1926  Transition of [facility]) CM/SW Contact:  Vinie Sill, LCSW Phone Number: 11/14/2021, 3:23 PM   Clinical Narrative:     Patient will Discharge to: [facility] Discharge Date: 11/14/2021 Family Notified: spouse Transport By: Corey Harold  Per MD patient is ready for discharge. RN, patient, and facility notified of discharge. Discharge Summary sent to facility. RN given number for [phone]. Ambulance transport requested for patient.   Clinical Social Worker signing off.  Thurmond Butts, MSW, LCSW Clinical Social Worker    Final next level of care: Skilled Nursing Facility Barriers to Discharge: Barriers Resolved   Patient Goals and CMS Choice        Discharge Placement              Patient chooses bed at: [facility] Re[facility] Patient to be transferred to facility by: [place] Name of family member notified: spouse Patient and family notified of of transfer: 11/14/21  Discharge Plan and Services In-house Referral: Clinical Social Work                                   Social Determinants of Health (SDOH) Interventions     Readmission Risk Interventions     No data to display

## 2021-11-14 NOTE — Plan of Care (Signed)
  Problem: Education: Goal: Knowledge of General Education information will improve Description: Including pain rating scale, medication(s)/side effects and non-pharmacologic comfort measures Outcome: Progressing   Problem: Clinical Measurements: Goal: Respiratory complications will improve Outcome: Progressing   Problem: Nutrition: Goal: Adequate nutrition will be maintained Outcome: Progressing   Problem: Coping: Goal: Level of anxiety will decrease Outcome: Progressing   Problem: Pain Managment: Goal: General experience of comfort will improve Outcome: Progressing   

## 2021-11-14 NOTE — Plan of Care (Signed)
  Problem: Education: Goal: Knowledge of General Education information will improve Description: Including pain rating scale, medication(s)/side effects and non-pharmacologic comfort measures 11/14/2021 1447 by Caroll Rancher, RN Outcome: Adequate for Discharge 11/14/2021 1113 by Caroll Rancher, RN Outcome: Progressing   Problem: Health Behavior/Discharge Planning: Goal: Ability to manage health-related needs will improve Outcome: Adequate for Discharge   Problem: Clinical Measurements: Goal: Ability to maintain clinical measurements within normal limits will improve Outcome: Adequate for Discharge Goal: Will remain free from infection Outcome: Adequate for Discharge Goal: Diagnostic test results will improve Outcome: Adequate for Discharge Goal: Respiratory complications will improve 11/14/2021 1447 by Caroll Rancher, RN Outcome: Adequate for Discharge 11/14/2021 1113 by Caroll Rancher, RN Outcome: Progressing Goal: Cardiovascular complication will be avoided Outcome: Adequate for Discharge   Problem: Activity: Goal: Risk for activity intolerance will decrease Outcome: Adequate for Discharge   Problem: Nutrition: Goal: Adequate nutrition will be maintained 11/14/2021 1447 by Caroll Rancher, RN Outcome: Adequate for Discharge 11/14/2021 1113 by Caroll Rancher, RN Outcome: Progressing   Problem: Coping: Goal: Level of anxiety will decrease 11/14/2021 1447 by Caroll Rancher, RN Outcome: Adequate for Discharge 11/14/2021 1113 by Caroll Rancher, RN Outcome: Progressing   Problem: Elimination: Goal: Will not experience complications related to bowel motility Outcome: Adequate for Discharge Goal: Will not experience complications related to urinary retention Outcome: Adequate for Discharge   Problem: Pain Managment: Goal: General experience of comfort will improve 11/14/2021 1447 by Caroll Rancher,  RN Outcome: Adequate for Discharge 11/14/2021 1113 by Caroll Rancher, RN Outcome: Progressing   Problem: Safety: Goal: Ability to remain free from injury will improve Outcome: Adequate for Discharge   Problem: Skin Integrity: Goal: Risk for impaired skin integrity will decrease Outcome: Adequate for Discharge   Problem: Education: Goal: Understanding of CV disease, CV risk reduction, and recovery process will improve Outcome: Adequate for Discharge Goal: Individualized Educational Video(s) Outcome: Adequate for Discharge   Problem: Activity: Goal: Ability to return to baseline activity level will improve Outcome: Adequate for Discharge   Problem: Cardiovascular: Goal: Ability to achieve and maintain adequate cardiovascular perfusion will improve Outcome: Adequate for Discharge Goal: Vascular access site(s) Level 0-1 will be maintained Outcome: Adequate for Discharge   Problem: Health Behavior/Discharge Planning: Goal: Ability to safely manage health-related needs after discharge will improve Outcome: Adequate for Discharge   Problem: Education: Goal: Knowledge of the prescribed therapeutic regimen will improve Outcome: Adequate for Discharge Goal: Ability to verbalize activity precautions or restrictions will improve Outcome: Adequate for Discharge Goal: Understanding of discharge needs will improve Outcome: Adequate for Discharge   Problem: Activity: Goal: Ability to perform//tolerate increased activity and mobilize with assistive devices will improve Outcome: Adequate for Discharge   Problem: Clinical Measurements: Goal: Postoperative complications will be avoided or minimized Outcome: Adequate for Discharge   Problem: Self-Care: Goal: Ability to meet self-care needs will improve Outcome: Adequate for Discharge   Problem: Self-Concept: Goal: Ability to maintain and perform role responsibilities to the fullest extent possible will improve Outcome:  Adequate for Discharge   Problem: Pain Management: Goal: Pain level will decrease with appropriate interventions Outcome: Adequate for Discharge   Problem: Skin Integrity: Goal: Demonstration of wound healing without infection will improve Outcome: Adequate for Discharge

## 2021-11-15 ENCOUNTER — Telehealth: Payer: Self-pay | Admitting: Physician Assistant

## 2021-11-15 NOTE — Telephone Encounter (Signed)
-----   Message from Gabriel Earing, [place] sent at 11/08/2021  6:49 AM EST ----- S/p agm with LLE intervention 11/6.  Please have pt f/u with Dr. Stanford Breed in 4 weeks with LLE arterial duplex and ABI and discussion about possible RLE intervention.  Thanks.

## 2021-11-16 ENCOUNTER — Other Ambulatory Visit: Payer: Self-pay | Admitting: *Deleted

## 2021-11-16 DIAGNOSIS — I739 Peripheral vascular disease, unspecified: Secondary | ICD-10-CM

## 2021-11-17 DIAGNOSIS — R300 Dysuria: Secondary | ICD-10-CM | POA: Diagnosis not present

## 2021-11-17 DIAGNOSIS — J9620 Acute and chronic respiratory failure, unspecified whether with hypoxia or hypercapnia: Secondary | ICD-10-CM | POA: Diagnosis not present

## 2021-11-18 ENCOUNTER — Inpatient Hospital Stay (HOSPITAL_COMMUNITY)
Admission: EM | Admit: 2021-11-18 | Discharge: 2021-11-24 | DRG: 871 | Disposition: A | Discharge status: Hospice | Payer: Medicare Other | Source: Skilled Nursing Facility | Attending: Internal Medicine | Admitting: Internal Medicine

## 2021-11-18 ENCOUNTER — Emergency Department (HOSPITAL_COMMUNITY): Payer: Medicare Other

## 2021-11-18 DIAGNOSIS — D696 Thrombocytopenia, unspecified: Secondary | ICD-10-CM | POA: Diagnosis not present

## 2021-11-18 DIAGNOSIS — F05 Delirium due to known physiological condition: Secondary | ICD-10-CM | POA: Diagnosis present

## 2021-11-18 DIAGNOSIS — I6930 Unspecified sequelae of cerebral infarction: Secondary | ICD-10-CM

## 2021-11-18 DIAGNOSIS — D631 Anemia in chronic kidney disease: Secondary | ICD-10-CM | POA: Diagnosis present

## 2021-11-18 DIAGNOSIS — G9341 Metabolic encephalopathy: Secondary | ICD-10-CM | POA: Diagnosis present

## 2021-11-18 DIAGNOSIS — I480 Paroxysmal atrial fibrillation: Secondary | ICD-10-CM | POA: Diagnosis present

## 2021-11-18 DIAGNOSIS — N1831 Chronic kidney disease, stage 3a: Secondary | ICD-10-CM | POA: Diagnosis present

## 2021-11-18 DIAGNOSIS — Z951 Presence of aortocoronary bypass graft: Secondary | ICD-10-CM

## 2021-11-18 DIAGNOSIS — Z87442 Personal history of urinary calculi: Secondary | ICD-10-CM

## 2021-11-18 DIAGNOSIS — Z95820 Peripheral vascular angioplasty status with implants and grafts: Secondary | ICD-10-CM

## 2021-11-18 DIAGNOSIS — L89312 Pressure ulcer of right buttock, stage 2: Secondary | ICD-10-CM | POA: Diagnosis present

## 2021-11-18 DIAGNOSIS — L89626 Pressure-induced deep tissue damage of left heel: Secondary | ICD-10-CM | POA: Diagnosis present

## 2021-11-18 DIAGNOSIS — Z87892 Personal history of anaphylaxis: Secondary | ICD-10-CM

## 2021-11-18 DIAGNOSIS — Z993 Dependence on wheelchair: Secondary | ICD-10-CM

## 2021-11-18 DIAGNOSIS — A4101 Sepsis due to Methicillin susceptible Staphylococcus aureus: Principal | ICD-10-CM | POA: Diagnosis present

## 2021-11-18 DIAGNOSIS — L97529 Non-pressure chronic ulcer of other part of left foot with unspecified severity: Secondary | ICD-10-CM | POA: Diagnosis present

## 2021-11-18 DIAGNOSIS — A419 Sepsis, unspecified organism: Secondary | ICD-10-CM | POA: Diagnosis present

## 2021-11-18 DIAGNOSIS — Z66 Do not resuscitate: Secondary | ICD-10-CM | POA: Diagnosis present

## 2021-11-18 DIAGNOSIS — R627 Adult failure to thrive: Secondary | ICD-10-CM | POA: Diagnosis present

## 2021-11-18 DIAGNOSIS — R4182 Altered mental status, unspecified: Secondary | ICD-10-CM

## 2021-11-18 DIAGNOSIS — R0902 Hypoxemia: Secondary | ICD-10-CM | POA: Diagnosis present

## 2021-11-18 DIAGNOSIS — Z885 Allergy status to narcotic agent status: Secondary | ICD-10-CM

## 2021-11-18 DIAGNOSIS — I2510 Atherosclerotic heart disease of native coronary artery without angina pectoris: Secondary | ICD-10-CM | POA: Diagnosis present

## 2021-11-18 DIAGNOSIS — Z9049 Acquired absence of other specified parts of digestive tract: Secondary | ICD-10-CM

## 2021-11-18 DIAGNOSIS — Z7901 Long term (current) use of anticoagulants: Secondary | ICD-10-CM

## 2021-11-18 DIAGNOSIS — B965 Pseudomonas (aeruginosa) (mallei) (pseudomallei) as the cause of diseases classified elsewhere: Secondary | ICD-10-CM | POA: Diagnosis present

## 2021-11-18 DIAGNOSIS — I76 Septic arterial embolism: Secondary | ICD-10-CM | POA: Diagnosis present

## 2021-11-18 DIAGNOSIS — Z87891 Personal history of nicotine dependence: Secondary | ICD-10-CM

## 2021-11-18 DIAGNOSIS — Z823 Family history of stroke: Secondary | ICD-10-CM

## 2021-11-18 DIAGNOSIS — T380X5A Adverse effect of glucocorticoids and synthetic analogues, initial encounter: Secondary | ICD-10-CM | POA: Diagnosis present

## 2021-11-18 DIAGNOSIS — F0390 Unspecified dementia without behavioral disturbance: Secondary | ICD-10-CM | POA: Diagnosis present

## 2021-11-18 DIAGNOSIS — Z89432 Acquired absence of left foot: Secondary | ICD-10-CM

## 2021-11-18 DIAGNOSIS — N179 Acute kidney failure, unspecified: Secondary | ICD-10-CM | POA: Diagnosis present

## 2021-11-18 DIAGNOSIS — N390 Urinary tract infection, site not specified: Secondary | ICD-10-CM | POA: Diagnosis present

## 2021-11-18 DIAGNOSIS — Z961 Presence of intraocular lens: Secondary | ICD-10-CM | POA: Diagnosis present

## 2021-11-18 DIAGNOSIS — Z79899 Other long term (current) drug therapy: Secondary | ICD-10-CM

## 2021-11-18 DIAGNOSIS — Z7952 Long term (current) use of systemic steroids: Secondary | ICD-10-CM

## 2021-11-18 DIAGNOSIS — Z8679 Personal history of other diseases of the circulatory system: Secondary | ICD-10-CM

## 2021-11-18 DIAGNOSIS — R6520 Severe sepsis without septic shock: Principal | ICD-10-CM

## 2021-11-18 DIAGNOSIS — Z1152 Encounter for screening for COVID-19: Secondary | ICD-10-CM

## 2021-11-18 DIAGNOSIS — R7303 Prediabetes: Secondary | ICD-10-CM | POA: Diagnosis present

## 2021-11-18 DIAGNOSIS — B9620 Unspecified Escherichia coli [E. coli] as the cause of diseases classified elsewhere: Secondary | ICD-10-CM | POA: Diagnosis present

## 2021-11-18 DIAGNOSIS — Z952 Presence of prosthetic heart valve: Secondary | ICD-10-CM

## 2021-11-18 DIAGNOSIS — E861 Hypovolemia: Secondary | ICD-10-CM | POA: Diagnosis present

## 2021-11-18 DIAGNOSIS — Z9989 Dependence on other enabling machines and devices: Secondary | ICD-10-CM

## 2021-11-18 DIAGNOSIS — L89610 Pressure ulcer of right heel, unstageable: Secondary | ICD-10-CM | POA: Diagnosis present

## 2021-11-18 DIAGNOSIS — I5042 Chronic combined systolic (congestive) and diastolic (congestive) heart failure: Secondary | ICD-10-CM | POA: Diagnosis present

## 2021-11-18 DIAGNOSIS — Z91030 Bee allergy status: Secondary | ICD-10-CM

## 2021-11-18 DIAGNOSIS — R6521 Severe sepsis with septic shock: Secondary | ICD-10-CM | POA: Diagnosis present

## 2021-11-18 DIAGNOSIS — E870 Hyperosmolality and hypernatremia: Secondary | ICD-10-CM | POA: Diagnosis present

## 2021-11-18 DIAGNOSIS — L97519 Non-pressure chronic ulcer of other part of right foot with unspecified severity: Secondary | ICD-10-CM | POA: Diagnosis present

## 2021-11-18 DIAGNOSIS — E669 Obesity, unspecified: Secondary | ICD-10-CM | POA: Diagnosis present

## 2021-11-18 DIAGNOSIS — I5032 Chronic diastolic (congestive) heart failure: Secondary | ICD-10-CM | POA: Diagnosis present

## 2021-11-18 DIAGNOSIS — Z818 Family history of other mental and behavioral disorders: Secondary | ICD-10-CM

## 2021-11-18 DIAGNOSIS — E871 Hypo-osmolality and hyponatremia: Secondary | ICD-10-CM | POA: Diagnosis present

## 2021-11-18 DIAGNOSIS — N170 Acute kidney failure with tubular necrosis: Secondary | ICD-10-CM | POA: Diagnosis present

## 2021-11-18 DIAGNOSIS — N183 Chronic kidney disease, stage 3 unspecified: Secondary | ICD-10-CM | POA: Diagnosis present

## 2021-11-18 DIAGNOSIS — Z881 Allergy status to other antibiotic agents status: Secondary | ICD-10-CM

## 2021-11-18 DIAGNOSIS — M353 Polymyalgia rheumatica: Secondary | ICD-10-CM | POA: Diagnosis present

## 2021-11-18 DIAGNOSIS — I739 Peripheral vascular disease, unspecified: Secondary | ICD-10-CM | POA: Diagnosis present

## 2021-11-18 DIAGNOSIS — M109 Gout, unspecified: Secondary | ICD-10-CM | POA: Diagnosis present

## 2021-11-18 DIAGNOSIS — Z6828 Body mass index (BMI) 28.0-28.9, adult: Secondary | ICD-10-CM

## 2021-11-18 DIAGNOSIS — I130 Hypertensive heart and chronic kidney disease with heart failure and stage 1 through stage 4 chronic kidney disease, or unspecified chronic kidney disease: Secondary | ICD-10-CM | POA: Diagnosis present

## 2021-11-18 DIAGNOSIS — E876 Hypokalemia: Secondary | ICD-10-CM | POA: Diagnosis not present

## 2021-11-18 DIAGNOSIS — G4733 Obstructive sleep apnea (adult) (pediatric): Secondary | ICD-10-CM | POA: Diagnosis present

## 2021-11-18 DIAGNOSIS — K219 Gastro-esophageal reflux disease without esophagitis: Secondary | ICD-10-CM | POA: Diagnosis present

## 2021-11-18 DIAGNOSIS — E273 Drug-induced adrenocortical insufficiency: Secondary | ICD-10-CM | POA: Diagnosis present

## 2021-11-18 DIAGNOSIS — Z880 Allergy status to penicillin: Secondary | ICD-10-CM

## 2021-11-18 DIAGNOSIS — I10 Essential (primary) hypertension: Secondary | ICD-10-CM | POA: Diagnosis present

## 2021-11-18 DIAGNOSIS — Z7983 Long term (current) use of bisphosphonates: Secondary | ICD-10-CM

## 2021-11-18 DIAGNOSIS — Z515 Encounter for palliative care: Secondary | ICD-10-CM

## 2021-11-18 DIAGNOSIS — I6340 Cerebral infarction due to embolism of unspecified cerebral artery: Secondary | ICD-10-CM | POA: Diagnosis not present

## 2021-11-18 DIAGNOSIS — E785 Hyperlipidemia, unspecified: Secondary | ICD-10-CM | POA: Diagnosis present

## 2021-11-18 DIAGNOSIS — Z7982 Long term (current) use of aspirin: Secondary | ICD-10-CM

## 2021-11-18 DIAGNOSIS — Z888 Allergy status to other drugs, medicaments and biological substances status: Secondary | ICD-10-CM

## 2021-11-18 LAB — COMPREHENSIVE METABOLIC PANEL
ALT: 45 U/L — ABNORMAL HIGH (ref 0–44)
AST: 79 U/L — ABNORMAL HIGH (ref 15–41)
Albumin: 2 g/dL — ABNORMAL LOW (ref 3.5–5.0)
Alkaline Phosphatase: 278 U/L — ABNORMAL HIGH (ref 38–126)
Anion gap: 17 — ABNORMAL HIGH (ref 5–15)
BUN: 86 mg/dL — ABNORMAL HIGH (ref 8–23)
CO2: 21 mmol/L — ABNORMAL LOW (ref 22–32)
Calcium: 8.8 mg/dL — ABNORMAL LOW (ref 8.9–10.3)
Chloride: 95 mmol/L — ABNORMAL LOW (ref 98–111)
Creatinine, Ser: 4.25 mg/dL — ABNORMAL HIGH (ref 0.61–1.24)
GFR, Estimated: 12 mL/min — ABNORMAL LOW (ref >60)
Glucose, Bld: 140 mg/dL — ABNORMAL HIGH (ref 70–99)
Potassium: 4.5 mmol/L (ref 3.5–5.1)
Sodium: 133 mmol/L — ABNORMAL LOW (ref 135–145)
Total Bilirubin: 1.2 mg/dL (ref 0.3–1.2)
Total Protein: 6.5 g/dL (ref 6.5–8.1)

## 2021-11-18 LAB — URINALYSIS, ROUTINE W REFLEX MICROSCOPIC
Bilirubin Urine: NEGATIVE
Glucose, UA: NEGATIVE mg/dL
Ketones, ur: NEGATIVE mg/dL
Nitrite: NEGATIVE
Protein, ur: 100 mg/dL — AB
Specific Gravity, Urine: 1.013 (ref 1.005–1.030)
WBC, UA: >50 WBC/hpf — ABNORMAL HIGH (ref 0–5)
pH: 5 (ref 5.0–8.0)

## 2021-11-18 LAB — I-STAT VENOUS BLOOD GAS, ED
Acid-base deficit: 4 mmol/L — ABNORMAL HIGH (ref 0.0–2.0)
Bicarbonate: 21.3 mmol/L (ref 20.0–28.0)
Calcium, Ion: 0.98 mmol/L — ABNORMAL LOW (ref 1.15–1.40)
HCT: 24 % — ABNORMAL LOW (ref 39.0–52.0)
Hemoglobin: 8.2 g/dL — ABNORMAL LOW (ref 13.0–17.0)
O2 Saturation: 75 %
Potassium: 4 mmol/L (ref 3.5–5.1)
Sodium: 132 mmol/L — ABNORMAL LOW (ref 135–145)
TCO2: 22 mmol/L (ref 22–32)
pCO2, Ven: 39.4 mmHg — ABNORMAL LOW (ref 44–60)
pH, Ven: 7.34 (ref 7.25–7.43)
pO2, Ven: 42 mmHg (ref 32–45)

## 2021-11-18 LAB — RESP PANEL BY RT-PCR (FLU A&B, COVID) ARPGX2
Influenza A by PCR: NEGATIVE
Influenza B by PCR: NEGATIVE
SARS Coronavirus 2 by RT PCR: NEGATIVE

## 2021-11-18 LAB — CBC WITH DIFFERENTIAL/PLATELET
Abs Immature Granulocytes: 0.5 10*3/uL — ABNORMAL HIGH (ref 0.00–0.07)
Basophils Absolute: 0.2 10*3/uL — ABNORMAL HIGH (ref 0.0–0.1)
Basophils Relative: 1 %
Eosinophils Absolute: 0 10*3/uL (ref 0.0–0.5)
Eosinophils Relative: 0 %
HCT: 25.6 % — ABNORMAL LOW (ref 39.0–52.0)
Hemoglobin: 7.8 g/dL — ABNORMAL LOW (ref 13.0–17.0)
Lymphocytes Relative: 3 %
Lymphs Abs: 0.5 10*3/uL — ABNORMAL LOW (ref 0.7–4.0)
MCH: 27.7 pg (ref 26.0–34.0)
MCHC: 30.5 g/dL (ref 30.0–36.0)
MCV: 90.8 fL (ref 80.0–100.0)
Monocytes Absolute: 0 10*3/uL — ABNORMAL LOW (ref 0.1–1.0)
Monocytes Relative: 0 %
Myelocytes: 3 %
Neutro Abs: 16.3 10*3/uL — ABNORMAL HIGH (ref 1.7–7.7)
Neutrophils Relative %: 93 %
Platelets: 171 10*3/uL (ref 150–400)
RBC: 2.82 MIL/uL — ABNORMAL LOW (ref 4.22–5.81)
RDW: 16.6 % — ABNORMAL HIGH (ref 11.5–15.5)
WBC: 17.5 10*3/uL — ABNORMAL HIGH (ref 4.0–10.5)
nRBC: 0 /100 WBC
nRBC: 0.1 % (ref 0.0–0.2)

## 2021-11-18 LAB — PROTIME-INR
INR: 1.7 — ABNORMAL HIGH (ref 0.8–1.2)
Prothrombin Time: 19.5 seconds — ABNORMAL HIGH (ref 11.4–15.2)

## 2021-11-18 LAB — APTT: aPTT: 33 seconds (ref 24–36)

## 2021-11-18 LAB — LACTIC ACID, PLASMA: Lactic Acid, Venous: 3.1 mmol/L (ref 0.5–1.9)

## 2021-11-18 MED ORDER — VANCOMYCIN HCL IN DEXTROSE 1-5 GM/200ML-% IV SOLN: 1000.0000 mg | Freq: Once | INTRAVENOUS | Status: DC

## 2021-11-18 MED ORDER — CHLORHEXIDINE GLUCONATE CLOTH 2 % EX PADS
6.0000 | MEDICATED_PAD | Freq: Every day | CUTANEOUS | Status: DC
  Administered 2021-11-20 – 2021-11-24 (×6): 6 via TOPICAL

## 2021-11-18 MED ORDER — ACETAMINOPHEN 650 MG RE SUPP
650.0000 mg | Freq: Once | RECTAL | Status: AC
  Administered 2021-11-18: 650 mg via RECTAL
  Filled 2021-11-18: qty 1

## 2021-11-18 MED ORDER — LACTATED RINGERS IV BOLUS (SEPSIS)
1000.0000 mL | Freq: Once | INTRAVENOUS | Status: AC
  Administered 2021-11-18: 1000 mL via INTRAVENOUS

## 2021-11-18 MED ORDER — VANCOMYCIN HCL 2000 MG/400ML IV SOLN
2000.0000 mg | Freq: Once | INTRAVENOUS | Status: AC
  Administered 2021-11-19: 2000 mg via INTRAVENOUS
  Filled 2021-11-18: qty 400

## 2021-11-18 MED ORDER — METRONIDAZOLE 500 MG/100ML IV SOLN
500.0000 mg | Freq: Once | INTRAVENOUS | Status: AC
  Administered 2021-11-18: 500 mg via INTRAVENOUS
  Filled 2021-11-18: qty 100

## 2021-11-18 MED ORDER — SODIUM CHLORIDE 0.9% FLUSH: 10.0000 mL | INTRAVENOUS | Status: DC | PRN

## 2021-11-18 MED ORDER — SODIUM CHLORIDE 0.9% FLUSH
10.0000 mL | Freq: Two times a day (BID) | INTRAVENOUS | Status: DC
  Administered 2021-11-20 (×2): 10 mL
  Administered 2021-11-21: 20 mL
  Administered 2021-11-23 – 2021-11-24 (×2): 10 mL

## 2021-11-18 MED ORDER — SODIUM CHLORIDE 0.9 % IV SOLN
2.0000 g | Freq: Once | INTRAVENOUS | Status: DC
  Administered 2021-11-18: 2 g via INTRAVENOUS
  Filled 2021-11-18: qty 12.5

## 2021-11-18 MED ORDER — VANCOMYCIN HCL 10 G IV SOLR: 2250.0000 mg | INTRAVENOUS | Status: DC

## 2021-11-18 MED ORDER — LACTATED RINGERS IV SOLN: INTRAVENOUS | Status: DC

## 2021-11-18 MED ORDER — SODIUM CHLORIDE 0.9 % IV SOLN
2.0000 g | Freq: Two times a day (BID) | INTRAVENOUS | Status: DC
  Administered 2021-11-19: 2 g via INTRAVENOUS
  Filled 2021-11-18: qty 12.5

## 2021-11-18 NOTE — Progress Notes (Signed)
Pharmacy Antibiotic Note  [name] [name] is a 88 y.o. male for which pharmacy has been consulted for cefepime and vancomycin dosing for sepsis.  Patient with a history of HTN, HLD, CAD s/p CABG, PAF s/p maze on anticoagulation, HF (improved), severe AS s/p TAVR, CVA with residual deficit, PVD, PMR, AAA, OSA . Recent admission for worsening foot wounds is s/p endovascular revascularization and left foot second ray amputation. Pt is presenting from [facility] following confusion.  SCr 1.52 - near baseline WBC pending; T 104.9; HR 99; RR 26  Plan: Metronidazole per MD Cefepime 2g q12hr Vancomycin 2000 mg once in the ED Will start 2250 mg q48hr (eAUC 508.8) unless change in renal function Trend WBC, Fever, Renal function F/u cultures, clinical course, WBC, fever De-escalate when able Levels at steady state  Height: '6\' 1"'$  (185.4 cm) Weight: 93.4 kg (206 lb) IBW/kg (Calculated) : 79.9  Temp (24hrs), Avg:104.9 F (40.5 C), Min:104.9 F (40.5 C), Max:104.9 F (40.5 C)  Recent Labs  Lab 11/12/21 0040 11/13/21 0057 11/14/21 0120  WBC 5.6 6.5 6.2  CREATININE 1.37* 1.43* 1.52*    Estimated Creatinine Clearance: 34.3 mL/min (A) (by C-G formula based on SCr of 1.52 mg/dL (H)).    Allergies  Allergen Reactions   Amoxicillin Other (See Comments)    Headache, debilitated   Bee Venom Anaphylaxis   Avelox [Moxifloxacin] Other (See Comments)    Unknown   Codeine Other (See Comments)    Unknown    Duloxetine Other (See Comments)    Unknown   Duloxetine Hcl Other (See Comments)    felt weird   Levitra [Vardenafil] Other (See Comments)    Unknown   Morphine And Related Other (See Comments)    hypotension   Penicillin G Sodium Other (See Comments)    Severe headache, fatigue "debilitated"   Testosterone Other (See Comments)    Unknown    Tizanidine Other (See Comments)    Unknown     Viagra [Sildenafil] Other (See Comments)    didn't like it    Antimicrobials this  admission: vancomycin 11/18 >>  flagyl 11/18 >>  cefepime 11/18 >>   Microbiology results: Pending  Thank you for allowing pharmacy to be a part of this patient's care.  Lorelei Pont, PharmD, BCPS 11/18/2021 7:21 PM ED Clinical Pharmacist -  [phone]

## 2021-11-18 NOTE — ED Notes (Signed)
Report given to Kuch, RN.  

## 2021-11-18 NOTE — ED Provider Notes (Signed)
[facility] EMERGENCY DEPARTMENT Provider Note   CSN: [id number] Arrival date & time: 11/18/21  7616     History  Chief Complaint  Patient presents with   Code Sepsis    [name] [name] is a 88 y.o. male.  The history is provided by the patient and medical records. No language interpreter was used.  Altered Mental Status Presenting symptoms: partial responsiveness   Severity:  Severe Episode history:  Continuous Timing:  Constant Progression:  Unchanged Chronicity:  New Context: recent illness and recent infection   Associated symptoms: fever   Associated symptoms: no abdominal pain, no agitation, no nausea and no rash    LVL 5 caveat for AMS and unresponsiveness     Home Medications Prior to Admission medications   Medication Sig Start Date End Date Taking? Authorizing Provider  acetaminophen (TYLENOL) 325 MG tablet Take 2 tablets (650 mg total) by mouth every 4 (four) hours as needed for mild pain (or temp > 37.5 C (99.5 F)). Patient taking differently: Take 650 mg by mouth in the morning and at bedtime. 06/13/21   Angiulli, Lavon Paganini, PA-C  albuterol (PROVENTIL) (2.5 MG/3ML) 0.083% nebulizer solution Take 3 mLs (2.5 mg total) by nebulization every 6 (six) hours as needed for wheezing or shortness of breath. 11/14/21   Terrilee Croak, MD  alum & mag hydroxide-simeth (MAALOX/MYLANTA) 200-200-20 MG/5ML suspension Take 15-30 mLs by mouth every 2 (two) hours as needed for indigestion. 11/14/21   Terrilee Croak, MD  amiodarone (PACERONE) 200 MG tablet Take 1 tablet (200 mg total) by mouth daily. 06/13/21   Angiulli, Lavon Paganini, PA-C  apixaban (ELIQUIS) 2.5 MG TABS tablet Take 2.5 mg by mouth 2 (two) times daily.    [provider]  aspirin EC 81 MG tablet Take 1 tablet (81 mg total) by mouth daily. Swallow whole. 11/15/21   Terrilee Croak, MD  atorvastatin (LIPITOR) 40 MG tablet Take 1 tablet (40 mg total) by mouth daily. 11/03/21   Adrian Prows, MD  bisacodyl  (DULCOLAX) 5 MG EC tablet Take 1 tablet (5 mg total) by mouth daily as needed for moderate constipation. 11/14/21   Terrilee Croak, MD  cholecalciferol (VITAMIN D3) 25 MCG (1000 UNIT) tablet Take 1 tablet (1,000 Units total) by mouth daily. 06/13/21   Angiulli, Lavon Paganini, PA-C  Cyanocobalamin (VITAMIN B-12) 5000 MCG TBDP Take 5,000 mcg by mouth daily. 06/13/21   Angiulli, Lavon Paganini, PA-C  cyclobenzaprine (FLEXERIL) 5 MG tablet Take 1 tablet (5 mg total) by mouth 3 (three) times daily as needed for muscle spasms. Patient taking differently: Take 5 mg by mouth at bedtime as needed for muscle spasms. 10/19/21   Kirsteins, Luanna Salk, MD  denosumab (PROLIA) 60 MG/ML SOSY injection Inject 60 mg into the skin every 6 (six) months.    [provider]  docusate sodium (COLACE) 100 MG capsule Take 1 capsule (100 mg total) by mouth 2 (two) times daily. 08/23/20   Hongalgi, Lenis Dickinson, MD  EPINEPHrine 0.3 mg/0.3 mL IJ SOAJ injection Inject 0.3 mg into the muscle as needed for anaphylaxis.    [provider]  ezetimibe (ZETIA) 10 MG tablet Take 1 tablet (10 mg total) by mouth daily. 06/13/21   Angiulli, Lavon Paganini, PA-C  feeding supplement, GLUCERNA SHAKE, (GLUCERNA SHAKE) LIQD Take 237 mLs by mouth 3 (three) times daily between meals. 11/14/21   Terrilee Croak, MD  finasteride (PROSCAR) 5 MG tablet Take 1 tablet (5 mg total) by mouth daily. 06/13/21  Angiulli, Lavon Paganini, PA-C  fish oil-omega-3 fatty acids 1000 MG capsule Take 1 capsule (1 g total) by mouth daily. 06/13/21   Angiulli, Lavon Paganini, PA-C  gabapentin (NEURONTIN) 100 MG capsule Take 2 capsules by mouth in the morning, 1 capsule in the evening, and 2 capsules at bedtime. Patient taking differently: Take 100-200 mg by mouth See admin instructions. Take 200 mg by mouth in the morning and at bedtime. Take 100 mg by mouth in the evening. 06/13/21   Angiulli, Lavon Paganini, PA-C  galantamine (RAZADYNE ER) 8 MG 24 hr capsule Take 1 capsule (8 mg total) by mouth  daily with breakfast. 06/13/21   Angiulli, Lavon Paganini, PA-C  guaiFENesin-dextromethorphan (ROBITUSSIN DM) 100-10 MG/5ML syrup Take 15 mLs by mouth every 4 (four) hours as needed for cough. 11/14/21   Terrilee Croak, MD  isosorbide mononitrate (IMDUR) 60 MG 24 hr tablet Take 1 tablet (60 mg total) by mouth daily. 06/13/21   Angiulli, Lavon Paganini, PA-C  leflunomide (ARAVA) 20 MG tablet Take 1/2 tablets (10 mg total) by mouth daily. 06/13/21   Angiulli, Lavon Paganini, PA-C  lidocaine (LIDODERM) 5 % Place 1 patch onto the skin daily. Remove & Discard patch within 12 hours or as directed by MD 11/15/21   Terrilee Croak, MD  Magnesium 250 MG TABS Take 1 tablet (250 mg total) by mouth daily. 06/13/21   Angiulli, Lavon Paganini, PA-C  memantine (NAMENDA) 10 MG tablet Take 1 tablet (10 mg total) by mouth 2 (two) times daily. 06/13/21   Angiulli, Lavon Paganini, PA-C  metoprolol succinate (TOPROL-XL) 25 MG 24 hr tablet Take 0.5 tablets (12.5 mg total) by mouth daily. 11/15/21   Terrilee Croak, MD  nitroGLYCERIN (NITROSTAT) 0.4 MG SL tablet Place 1 tablet (0.4 mg total) under the tongue every 5 (five) minutes x 3 doses as needed for chest pain. 12/16/19   Cantwell, Celeste C, PA-C  nutrition supplement, JUVEN, (JUVEN) PACK Take 1 packet by mouth 2 (two) times daily between meals. 11/15/21   Terrilee Croak, MD  pantoprazole (PROTONIX) 40 MG tablet Take 40 mg by mouth daily.    [provider]  polyethylene glycol (MIRALAX) 17 g packet Take 17 g by mouth daily.    [provider]  potassium chloride (KLOR-CON M) 10 MEQ tablet Take 1 tablet by mouth everyday with furosemide Patient taking differently: Take 10 mEq by mouth daily. 06/13/21   Angiulli, Lavon Paganini, PA-C  predniSONE (DELTASONE) 1 MG tablet Take 3 tablets (3 mg total) by mouth daily with breakfast. 06/13/21   Angiulli, Lavon Paganini, PA-C  Psyllium Husk POWD Take 1 Scoop by mouth daily.     [provider]  tamsulosin (FLOMAX) 0.4 MG CAPS capsule Take 1 capsule (0.4  mg total) by mouth daily after supper. 06/13/21   Angiulli, Lavon Paganini, PA-C  torsemide (DEMADEX) 20 MG tablet Take 2 tablets (40 mg total) by mouth daily. 06/13/21   Angiulli, Lavon Paganini, PA-C  traMADol (ULTRAM) 50 MG tablet Take 0.5 tablets (25 mg total) by mouth every 6 (six) hours as needed for up to 5 days for moderate pain. 11/14/21 11/19/21  Terrilee Croak, MD  triamcinolone cream (KENALOG) 0.1 % Apply 1 Application topically as needed (rash/itching).    [provider]  vitamin C (ASCORBIC ACID) 500 MG tablet Take 1 tablet (500 mg total) by mouth daily. 06/13/21   Angiulli, Lavon Paganini, PA-C  zinc sulfate 220 (50 Zn) MG capsule Take 1 capsule (220 mg total) by mouth daily.  11/15/21   Dahal, Marlowe Aschoff, MD  potassium chloride (MICRO-K) 10 MEQ CR capsule TAKE ONE CAPSULE BY MOUTH EVERY DAY WITH LASIX Patient taking differently: Take 10 mEq by mouth daily. 02/16/21 06/13/21  Cantwell, Celeste C, PA-C      Allergies    Amoxicillin, Bee venom, Avelox [moxifloxacin], Codeine, Duloxetine, Duloxetine hcl, Levitra [vardenafil], Morphine and related, Penicillin g sodium, Testosterone, Tizanidine, and Viagra [sildenafil]    Review of Systems   Review of Systems  Unable to perform ROS: Patient unresponsive  Constitutional:  Positive for fever.  Gastrointestinal:  Negative for abdominal pain and nausea.  Skin:  Negative for rash.  Psychiatric/Behavioral:  Negative for agitation.     Physical Exam Updated Vital Signs BP (!) 110/59   Pulse 66   Temp (!) 102 F (38.9 C) (Oral)   Resp 20   Ht '6\' 1"'$  (1.854 m)   Wt 93.4 kg   SpO2 91%   BMI 27.18 kg/m  Physical Exam Vitals and nursing note reviewed.  Constitutional:      General: He is not in acute distress.    Appearance: He is well-developed. He is ill-appearing.  HENT:     Head: Normocephalic and atraumatic.  Eyes:     Conjunctiva/sclera: Conjunctivae normal.  Cardiovascular:     Rate and Rhythm: Normal rate.     Heart sounds: No murmur  heard. Pulmonary:     Effort: Pulmonary effort is normal. No respiratory distress.     Breath sounds: Rhonchi present. No wheezing or rales.  Chest:     Chest wall: No tenderness.  Abdominal:     General: Abdomen is flat.     Palpations: Abdomen is soft.     Tenderness: There is no abdominal tenderness. There is no right CVA tenderness, left CVA tenderness, guarding or rebound.  Musculoskeletal:        General: No swelling or tenderness.     Cervical back: Neck supple.     Right lower leg: No edema.     Left lower leg: No edema.     Comments: Left second toe amputation with dressing in place.  Mild erythema but no tenderness or crepitance on my initial exam.  Pulses were palpable on my exam.  Skin:    General: Skin is warm and dry.     Capillary Refill: Capillary refill takes less than 2 seconds.     Findings: Erythema present.  Neurological:     GCS: GCS eye subscore is 1. GCS verbal subscore is 1. GCS motor subscore is 4.     ED Results / Procedures / Treatments   Labs (all labs ordered are listed, but only abnormal results are displayed) Labs Reviewed  LACTIC ACID, PLASMA - Abnormal; Notable for the following components:      Result Value   Lactic Acid, Venous 3.1 (*)    All other components within normal limits  COMPREHENSIVE METABOLIC PANEL - Abnormal; Notable for the following components:   Sodium 133 (*)    Chloride 95 (*)    CO2 21 (*)    Glucose, Bld 140 (*)    BUN 86 (*)    Creatinine, Ser 4.25 (*)    Calcium 8.8 (*)    Albumin 2.0 (*)    AST 79 (*)    ALT 45 (*)    Alkaline Phosphatase 278 (*)    GFR, Estimated 12 (*)    Anion gap 17 (*)    All other components within normal limits  CBC WITH DIFFERENTIAL/PLATELET - Abnormal; Notable for the following components:   WBC 17.5 (*)    RBC 2.82 (*)    Hemoglobin 7.8 (*)    HCT 25.6 (*)    RDW 16.6 (*)    Neutro Abs 16.3 (*)    Lymphs Abs 0.5 (*)    Monocytes Absolute 0.0 (*)    Basophils Absolute 0.2 (*)     Abs Immature Granulocytes 0.50 (*)    All other components within normal limits  URINALYSIS, ROUTINE W REFLEX MICROSCOPIC - Abnormal; Notable for the following components:   Color, Urine AMBER (*)    APPearance TURBID (*)    Hgb urine dipstick MODERATE (*)    Protein, ur 100 (*)    Leukocytes,Ua LARGE (*)    WBC, UA >50 (*)    Bacteria, UA MANY (*)    All other components within normal limits  PROTIME-INR - Abnormal; Notable for the following components:   Prothrombin Time 19.5 (*)    INR 1.7 (*)    All other components within normal limits  I-STAT VENOUS BLOOD GAS, ED - Abnormal; Notable for the following components:   pCO2, Ven 39.4 (*)    Acid-base deficit 4.0 (*)    Sodium 132 (*)    Calcium, Ion 0.98 (*)    HCT 24.0 (*)    Hemoglobin 8.2 (*)    All other components within normal limits  RESP PANEL BY RT-PCR (FLU A&B, COVID) ARPGX2  CULTURE, BLOOD (ROUTINE X 2)  CULTURE, BLOOD (ROUTINE X 2)  URINE CULTURE  APTT  LACTIC ACID, PLASMA    EKG EKG Interpretation  Date/Time:  Saturday November 18 2021 18:48:22 EST Ventricular Rate:  100 PR Interval:  166 QRS Duration: 158 QT Interval:  414 QTC Calculation: 534 R Axis:   -64 Text Interpretation: Sinus or ectopic atrial tachycardia RBBB and LAFB when comapred to prior, faster rate. NO STEMI Confirmed by Antony Blackbird [phone]) on 11/18/2021 6:49:57 PM  Radiology DG Foot Complete Left  Result Date: 11/18/2021 CLINICAL DATA:  Questionable sepsis - evaluate for abnormality. Hypoxia, recent left second toe amputation with sepsis now. Rule out new osteomyelitis. EXAM: LEFT FOOT - COMPLETE 3+ VIEW COMPARISON:  11/02/2021 FINDINGS: Prior transmetatarsal amputation of the left 2nd toe. No definite changes of acute osteomyelitis at the amputation site or elsewhere within the left foot. No fracture, subluxation or dislocation. No soft tissue gas. IMPRESSION: Prior left 2nd toe amputation.  No visible acute bony abnormality.  Electronically Signed   By: Rolm Baptise M.D.   On: 11/18/2021 20:14   DG Chest Port 1 View  Result Date: 11/18/2021 CLINICAL DATA:  Questionable sepsis EXAM: PORTABLE CHEST 1 VIEW COMPARISON:  Chest x-ray 11/02/2021 FINDINGS: Patient is status post TAVR. Sternotomy wires are again noted. The aorta is ectatic. The heart is enlarged, unchanged. There is no focal lung infiltrate, pleural effusion or pneumothorax. Surgical clips are seen in the left axilla. No acute fractures. IMPRESSION: 1. No active disease. 2. Cardiomegaly. 3. Status post TAVR. Electronically Signed   By: Ronney Asters M.D.   On: 11/18/2021 20:12   CT HEAD WO CONTRAST (5MM)  Result Date: 11/18/2021 CLINICAL DATA:  Mental status change. EXAM: CT HEAD WITHOUT CONTRAST TECHNIQUE: Contiguous axial images were obtained from the base of the skull through the vertex without intravenous contrast. RADIATION DOSE REDUCTION: This exam was performed according to the departmental dose-optimization program which includes automated exposure control, adjustment of the mA and/or kV according to  patient size and/or use of iterative reconstruction technique. COMPARISON:  MRI brain 05/25/2021. CT angiogram head and neck 05/25/2021. FINDINGS: Brain: No evidence of acute infarction, hemorrhage, hydrocephalus, extra-axial collection or mass lesion/mass effect. There are areas of chronic cortical infarct in the left frontal lobe left operculum corresponding to acute infarcts seen on 05/25/2021. There is stable mild periventricular white matter hypodensity, likely chronic small vessel ischemic change. Small old cerebellar infarcts are present similar to prior. Vascular: Atherosclerotic calcifications are present within the cavernous internal carotid arteries. Skull: Normal. Negative for fracture or focal lesion. Sinuses/Orbits: No acute finding. Other: None. IMPRESSION: 1. No acute intracranial process. If there is high clinical concern for small acute infarct,  recommend further evaluation with MRI. 2. Chronic infarcts in the left frontal lobe and left operculum. 3. Stable mild chronic small vessel ischemic change of the white matter. Electronically Signed   By: Ronney Asters M.D.   On: 11/18/2021 19:46    Procedures Procedures    CRITICAL CARE Performed by: Gwenyth Allegra Ayano Douthitt Total critical care time: 45 minutes Critical care time was exclusive of separately billable procedures and treating other patients. Critical care was necessary to treat or prevent imminent or life-threatening deterioration. Critical care was time spent personally by me on the following activities: development of treatment plan with patient and/or surrogate as well as nursing, discussions with consultants, evaluation of patient's response to treatment, examination of patient, obtaining history from patient or surrogate, ordering and performing treatments and interventions, ordering and review of laboratory studies, ordering and review of radiographic studies, pulse oximetry and re-evaluation of patient's condition.   Medications Ordered in ED Medications  lactated ringers infusion ( Intravenous New Bag/Given 11/18/21 2128)  lactated ringers bolus 1,000 mL (0 mLs Intravenous Stopped 11/18/21 2312)    And  lactated ringers bolus 1,000 mL (0 mLs Intravenous Stopped 11/18/21 2201)    And  lactated ringers bolus 1,000 mL (1,000 mLs Intravenous New Bag/Given 11/18/21 2326)  vancomycin (VANCOREADY) IVPB 2000 mg/400 mL (has no administration in time range)  vancomycin (VANCOCIN) 2,250 mg in sodium chloride 0.9 % 500 mL IVPB (has no administration in time range)  ceFEPIme (MAXIPIME) 2 g in sodium chloride 0.9 % 100 mL IVPB (2 g Intravenous Not Given 11/18/21 2125)  sodium chloride flush (NS) 0.9 % injection 10-40 mL ( Intracatheter Not Given 11/18/21 2202)  sodium chloride flush (NS) 0.9 % injection 10-40 mL (has no administration in time range)  Chlorhexidine Gluconate Cloth 2 %  PADS 6 each (has no administration in time range)  metroNIDAZOLE (FLAGYL) IVPB 500 mg (0 mg Intravenous Stopped 11/18/21 2235)  acetaminophen (TYLENOL) suppository 650 mg (650 mg Rectal Given 11/18/21 2000)    ED Course/ Medical Decision Making/ A&P                           Medical Decision Making Amount and/or Complexity of Data Reviewed Labs: ordered. Radiology: ordered. ECG/medicine tests: ordered.  Risk OTC drugs. Prescription drug management. Decision regarding hospitalization.    MONTAVIS SCHUBRING is a 88 y.o. male with a complex past medical history including thoracic ascending aortic aneurysm and AAA, CAD status post CABG, paroxysmal atrial fibrillation on Eliquis therapy, hypertension, hyperlipidemia, polymyalgia rheumatica, previous TAVR, previous maze procedure, GERD, recent left second toe osteomyelitis status post removal on 11/08/2021 (10 days ago) as well as left femoral-popliteal angioplasty and stenting on 11/06/2021 (12 days ago) who presents for hypoxia, altered mental status, warmth  to the touch, and concern for sepsis.  According to family and EMS report to nursing, patient has been more confused the last few days.  He has felt warm but did not have a fever until a rectal temperature on arrival was found to be 104.9.  He was reportedly talking with family on Tuesday and Wednesday but was starting to act slightly confused.  He has progressed to more somnolence and was found to be hypoxic with EMS needing 3 L nasal cannula to maintain oxygen saturations.  They do not report any recent coughing but he does have coarse breath sounds per EMS.  On exam patient is altered and minimally responsive and he is DNR/DNI with paperwork with him.  On my initial exam, he has coarse breath sounds bilaterally and was hypoxic when oxygen was off of him.  Abdomen and chest were nontender but he also he was only minimally responding to painful stimuli.  Patient had his foot unwrapped and there is  a left second toe amputation with some mild erythema but he was not responding to tenderness on the area.  He had intact pulses on my exam.  Pupils are symmetric and reactive but he is not following commands.  Due to his vital signs I activated code sepsis.  I am most concerned about his urine given his family concern for that.  He is on oxygen now so we will get chest x-ray and other labs.  X-ray did not show convincing evidence of pneumonia and CT head did not show acute bleed or other acute abnormality.  X-ray of the foot did not show acute osteomyelitis.  Patient does have evidence of acute kidney injury, COVID and flu negative, and he is anemic.  Hemoglobin appears similar to recent.  Lactic acid elevated at 3.1 and he is getting fluids.  Urinalysis does show evidence of UTI.  Patient will be admitted to medicine for further management of sepsis from likely urinary source.  Temperature improved after rectal Tylenol.         Final Clinical Impression(s) / ED Diagnoses Final diagnoses:  Sepsis with acute organ dysfunction, due to unspecified organism, unspecified organ dysfunction type, unspecified whether septic shock present ([place])  Altered mental status, unspecified altered mental status type    Clinical Impression: 1. Sepsis with acute organ dysfunction, due to unspecified organism, unspecified organ dysfunction type, unspecified whether septic shock present ([place])   2. Altered mental status, unspecified altered mental status type     Disposition: Admit  This note was prepared with assistance of Dragon voice recognition software. Occasional wrong-word or sound-a-like substitutions may have occurred due to the inherent limitations of voice recognition software.     Mazi Brailsford, Gwenyth Allegra, MD 11/18/21 [phone]

## 2021-11-18 NOTE — Progress Notes (Signed)

## 2021-11-18 NOTE — ED Triage Notes (Signed)
Patient arrived to ED via EMS from [place]. Patient had surgery recently on foot. EMS reports that patient is confused at baseline but able to talk and answer questions. Patient will not open eyes and does not respond.

## 2021-11-18 NOTE — ED Notes (Signed)
Difficulty getting IV access, provider made aware and IV team consult requested

## 2021-11-19 ENCOUNTER — Emergency Department (HOSPITAL_COMMUNITY): Payer: Medicare Other

## 2021-11-19 ENCOUNTER — Inpatient Hospital Stay (HOSPITAL_COMMUNITY): Payer: Medicare Other

## 2021-11-19 DIAGNOSIS — E870 Hyperosmolality and hypernatremia: Secondary | ICD-10-CM | POA: Diagnosis present

## 2021-11-19 DIAGNOSIS — I739 Peripheral vascular disease, unspecified: Secondary | ICD-10-CM | POA: Diagnosis present

## 2021-11-19 DIAGNOSIS — Z515 Encounter for palliative care: Secondary | ICD-10-CM | POA: Diagnosis not present

## 2021-11-19 DIAGNOSIS — M353 Polymyalgia rheumatica: Secondary | ICD-10-CM | POA: Diagnosis present

## 2021-11-19 DIAGNOSIS — D696 Thrombocytopenia, unspecified: Secondary | ICD-10-CM | POA: Diagnosis not present

## 2021-11-19 DIAGNOSIS — G4733 Obstructive sleep apnea (adult) (pediatric): Secondary | ICD-10-CM

## 2021-11-19 DIAGNOSIS — R627 Adult failure to thrive: Secondary | ICD-10-CM | POA: Diagnosis not present

## 2021-11-19 DIAGNOSIS — R6520 Severe sepsis without septic shock: Secondary | ICD-10-CM

## 2021-11-19 DIAGNOSIS — Z66 Do not resuscitate: Secondary | ICD-10-CM | POA: Diagnosis present

## 2021-11-19 DIAGNOSIS — R6521 Severe sepsis with septic shock: Secondary | ICD-10-CM | POA: Diagnosis present

## 2021-11-19 DIAGNOSIS — Z7189 Other specified counseling: Secondary | ICD-10-CM | POA: Diagnosis not present

## 2021-11-19 DIAGNOSIS — I10 Essential (primary) hypertension: Secondary | ICD-10-CM

## 2021-11-19 DIAGNOSIS — F0390 Unspecified dementia without behavioral disturbance: Secondary | ICD-10-CM | POA: Diagnosis present

## 2021-11-19 DIAGNOSIS — I38 Endocarditis, valve unspecified: Secondary | ICD-10-CM | POA: Diagnosis not present

## 2021-11-19 DIAGNOSIS — G9341 Metabolic encephalopathy: Secondary | ICD-10-CM | POA: Diagnosis present

## 2021-11-19 DIAGNOSIS — E669 Obesity, unspecified: Secondary | ICD-10-CM | POA: Diagnosis present

## 2021-11-19 DIAGNOSIS — N179 Acute kidney failure, unspecified: Secondary | ICD-10-CM | POA: Diagnosis not present

## 2021-11-19 DIAGNOSIS — F03A0 Unspecified dementia, mild, without behavioral disturbance, psychotic disturbance, mood disturbance, and anxiety: Secondary | ICD-10-CM

## 2021-11-19 DIAGNOSIS — F05 Delirium due to known physiological condition: Secondary | ICD-10-CM | POA: Diagnosis present

## 2021-11-19 DIAGNOSIS — N390 Urinary tract infection, site not specified: Secondary | ICD-10-CM

## 2021-11-19 DIAGNOSIS — R4182 Altered mental status, unspecified: Secondary | ICD-10-CM

## 2021-11-19 DIAGNOSIS — E871 Hypo-osmolality and hyponatremia: Secondary | ICD-10-CM | POA: Diagnosis present

## 2021-11-19 DIAGNOSIS — I76 Septic arterial embolism: Secondary | ICD-10-CM | POA: Diagnosis present

## 2021-11-19 DIAGNOSIS — L97529 Non-pressure chronic ulcer of other part of left foot with unspecified severity: Secondary | ICD-10-CM

## 2021-11-19 DIAGNOSIS — I130 Hypertensive heart and chronic kidney disease with heart failure and stage 1 through stage 4 chronic kidney disease, or unspecified chronic kidney disease: Secondary | ICD-10-CM | POA: Diagnosis present

## 2021-11-19 DIAGNOSIS — N1831 Chronic kidney disease, stage 3a: Secondary | ICD-10-CM | POA: Diagnosis present

## 2021-11-19 DIAGNOSIS — A419 Sepsis, unspecified organism: Secondary | ICD-10-CM

## 2021-11-19 DIAGNOSIS — L97519 Non-pressure chronic ulcer of other part of right foot with unspecified severity: Secondary | ICD-10-CM

## 2021-11-19 DIAGNOSIS — I6930 Unspecified sequelae of cerebral infarction: Secondary | ICD-10-CM

## 2021-11-19 DIAGNOSIS — D631 Anemia in chronic kidney disease: Secondary | ICD-10-CM | POA: Diagnosis present

## 2021-11-19 DIAGNOSIS — Z1152 Encounter for screening for COVID-19: Secondary | ICD-10-CM | POA: Diagnosis not present

## 2021-11-19 DIAGNOSIS — Z952 Presence of prosthetic heart valve: Secondary | ICD-10-CM | POA: Diagnosis not present

## 2021-11-19 DIAGNOSIS — N170 Acute kidney failure with tubular necrosis: Secondary | ICD-10-CM | POA: Diagnosis present

## 2021-11-19 DIAGNOSIS — L89312 Pressure ulcer of right buttock, stage 2: Secondary | ICD-10-CM | POA: Diagnosis present

## 2021-11-19 DIAGNOSIS — A4101 Sepsis due to Methicillin susceptible Staphylococcus aureus: Secondary | ICD-10-CM | POA: Diagnosis present

## 2021-11-19 DIAGNOSIS — I480 Paroxysmal atrial fibrillation: Secondary | ICD-10-CM

## 2021-11-19 DIAGNOSIS — E273 Drug-induced adrenocortical insufficiency: Secondary | ICD-10-CM | POA: Diagnosis present

## 2021-11-19 DIAGNOSIS — I6340 Cerebral infarction due to embolism of unspecified cerebral artery: Secondary | ICD-10-CM | POA: Diagnosis not present

## 2021-11-19 DIAGNOSIS — I5042 Chronic combined systolic (congestive) and diastolic (congestive) heart failure: Secondary | ICD-10-CM | POA: Diagnosis present

## 2021-11-19 LAB — BLOOD CULTURE ID PANEL (REFLEXED) - BCID2

## 2021-11-19 LAB — COMPREHENSIVE METABOLIC PANEL
ALT: 44 U/L (ref 0–44)
ALT: 40 U/L (ref 0–44)
AST: 90 U/L — ABNORMAL HIGH (ref 15–41)
AST: 84 U/L — ABNORMAL HIGH (ref 15–41)
Albumin: 1.6 g/dL — ABNORMAL LOW (ref 3.5–5.0)
Albumin: 1.6 g/dL — ABNORMAL LOW (ref 3.5–5.0)
Alkaline Phosphatase: 207 U/L — ABNORMAL HIGH (ref 38–126)
Alkaline Phosphatase: 209 U/L — ABNORMAL HIGH (ref 38–126)
Anion gap: 20 — ABNORMAL HIGH (ref 5–15)
Anion gap: 12 (ref 5–15)
BUN: 83 mg/dL — ABNORMAL HIGH (ref 8–23)
BUN: 81 mg/dL — ABNORMAL HIGH (ref 8–23)
CO2: 17 mmol/L — ABNORMAL LOW (ref 22–32)
CO2: 19 mmol/L — ABNORMAL LOW (ref 22–32)
Calcium: 8.4 mg/dL — ABNORMAL LOW (ref 8.9–10.3)
Calcium: 7.9 mg/dL — ABNORMAL LOW (ref 8.9–10.3)
Chloride: 98 mmol/L (ref 98–111)
Chloride: 103 mmol/L (ref 98–111)
Creatinine, Ser: 4.05 mg/dL — ABNORMAL HIGH (ref 0.61–1.24)
Creatinine, Ser: 3.7 mg/dL — ABNORMAL HIGH (ref 0.61–1.24)
GFR, Estimated: 13 mL/min — ABNORMAL LOW (ref >60)
GFR, Estimated: 15 mL/min — ABNORMAL LOW (ref >60)
Glucose, Bld: 137 mg/dL — ABNORMAL HIGH (ref 70–99)
Glucose, Bld: 144 mg/dL — ABNORMAL HIGH (ref 70–99)
Potassium: 4.3 mmol/L (ref 3.5–5.1)
Potassium: 3.8 mmol/L (ref 3.5–5.1)
Sodium: 135 mmol/L (ref 135–145)
Sodium: 134 mmol/L — ABNORMAL LOW (ref 135–145)
Total Bilirubin: 1 mg/dL (ref 0.3–1.2)
Total Bilirubin: 0.8 mg/dL (ref 0.3–1.2)
Total Protein: 5.6 g/dL — ABNORMAL LOW (ref 6.5–8.1)
Total Protein: 5.8 g/dL — ABNORMAL LOW (ref 6.5–8.1)

## 2021-11-19 LAB — GLUCOSE, CAPILLARY: Glucose-Capillary: 143 mg/dL — ABNORMAL HIGH (ref 70–99)

## 2021-11-19 LAB — CBC
HCT: 23.1 % — ABNORMAL LOW (ref 39.0–52.0)
Hemoglobin: 7 g/dL — ABNORMAL LOW (ref 13.0–17.0)
MCH: 27.9 pg (ref 26.0–34.0)
MCHC: 30.3 g/dL (ref 30.0–36.0)
MCV: 92 fL (ref 80.0–100.0)
Platelets: 110 10*3/uL — ABNORMAL LOW (ref 150–400)
RBC: 2.51 MIL/uL — ABNORMAL LOW (ref 4.22–5.81)
RDW: 16.6 % — ABNORMAL HIGH (ref 11.5–15.5)
WBC: 18.1 10*3/uL — ABNORMAL HIGH (ref 4.0–10.5)
nRBC: 0.1 % (ref 0.0–0.2)

## 2021-11-19 LAB — MRSA NEXT GEN BY PCR, NASAL: MRSA by PCR Next Gen: NOT DETECTED

## 2021-11-19 LAB — PROTIME-INR
INR: 1.8 — ABNORMAL HIGH (ref 0.8–1.2)
Prothrombin Time: 20.6 seconds — ABNORMAL HIGH (ref 11.4–15.2)

## 2021-11-19 LAB — HEPARIN LEVEL (UNFRACTIONATED)
Heparin Unfractionated: >1.1 IU/mL — ABNORMAL HIGH (ref 0.30–0.70)
Heparin Unfractionated: >1.1 IU/mL — ABNORMAL HIGH (ref 0.30–0.70)

## 2021-11-19 LAB — LACTIC ACID, PLASMA
Lactic Acid, Venous: 4 mmol/L (ref 0.5–1.9)
Lactic Acid, Venous: 3.4 mmol/L (ref 0.5–1.9)
Lactic Acid, Venous: 1.5 mmol/L (ref 0.5–1.9)

## 2021-11-19 LAB — PROCALCITONIN: Procalcitonin: 112.32 ng/mL

## 2021-11-19 LAB — CORTISOL-AM, BLOOD: Cortisol - AM: 38.3 ug/dL — ABNORMAL HIGH (ref 6.7–22.6)

## 2021-11-19 LAB — APTT
aPTT: 188 seconds (ref 24–36)
aPTT: 61 seconds — ABNORMAL HIGH (ref 24–36)

## 2021-11-19 MED ORDER — ONDANSETRON HCL 4 MG/2ML IJ SOLN: 4.0000 mg | Freq: Four times a day (QID) | INTRAMUSCULAR | Status: DC | PRN

## 2021-11-19 MED ORDER — SODIUM CHLORIDE 0.9 % IV SOLN: 250.0000 mL | INTRAVENOUS | Status: DC

## 2021-11-19 MED ORDER — HEPARIN (PORCINE) 25000 UT/250ML-% IV SOLN
1250.0000 [IU]/h | INTRAVENOUS | Status: DC
  Administered 2021-11-19: 1250 [IU]/h via INTRAVENOUS
  Filled 2021-11-19: qty 250

## 2021-11-19 MED ORDER — SODIUM CHLORIDE 0.9 % IV SOLN: 2.0000 g | INTRAVENOUS | Status: DC

## 2021-11-19 MED ORDER — SODIUM CHLORIDE 0.9 % IV SOLN: INTRAVENOUS | Status: DC

## 2021-11-19 MED ORDER — NOREPINEPHRINE 4 MG/250ML-% IV SOLN
2.0000 ug/min | INTRAVENOUS | Status: DC
  Administered 2021-11-19: 4 ug/min via INTRAVENOUS
  Administered 2021-11-19: 2 ug/min via INTRAVENOUS
  Filled 2021-11-19 (×2): qty 250

## 2021-11-19 MED ORDER — ONDANSETRON HCL 4 MG PO TABS: 4.0000 mg | ORAL_TABLET | Freq: Four times a day (QID) | ORAL | Status: DC | PRN

## 2021-11-19 MED ORDER — VANCOMYCIN VARIABLE DOSE PER UNSTABLE RENAL FUNCTION (PHARMACIST DOSING): Status: DC

## 2021-11-19 MED ORDER — ACETAMINOPHEN 650 MG RE SUPP
650.0000 mg | Freq: Four times a day (QID) | RECTAL | Status: DC | PRN
  Filled 2021-11-19: qty 1

## 2021-11-19 MED ORDER — PANTOPRAZOLE SODIUM 40 MG IV SOLR
40.0000 mg | Freq: Every day | INTRAVENOUS | Status: DC
  Administered 2021-11-19 – 2021-11-24 (×6): 40 mg via INTRAVENOUS
  Filled 2021-11-19 (×6): qty 10

## 2021-11-19 MED ORDER — HEPARIN (PORCINE) 25000 UT/250ML-% IV SOLN
1200.0000 [IU]/h | INTRAVENOUS | Status: DC
  Administered 2021-11-19 – 2021-11-20 (×2): 1000 [IU]/h via INTRAVENOUS
  Filled 2021-11-19 (×2): qty 250

## 2021-11-19 MED ORDER — ACETAMINOPHEN 10 MG/ML IV SOLN
1000.0000 mg | Freq: Four times a day (QID) | INTRAVENOUS | Status: DC | PRN
  Administered 2021-11-19: 1000 mg via INTRAVENOUS
  Filled 2021-11-19: qty 100

## 2021-11-19 MED ORDER — SODIUM CHLORIDE 0.9 % IV BOLUS
500.0000 mL | Freq: Once | INTRAVENOUS | Status: AC
  Administered 2021-11-19: 500 mL via INTRAVENOUS

## 2021-11-19 MED ORDER — ACETAMINOPHEN 325 MG PO TABS: 650.0000 mg | ORAL_TABLET | Freq: Four times a day (QID) | ORAL | Status: DC | PRN

## 2021-11-19 MED ORDER — ACETAMINOPHEN 10 MG/ML IV SOLN: 1000.0000 mg | Freq: Four times a day (QID) | INTRAVENOUS | Status: DC | PRN

## 2021-11-19 MED ORDER — ACETAMINOPHEN 10 MG/ML IV SOLN
1000.0000 mg | Freq: Four times a day (QID) | INTRAVENOUS | Status: AC | PRN
  Administered 2021-11-19: 1000 mg via INTRAVENOUS
  Filled 2021-11-19: qty 100

## 2021-11-19 NOTE — Progress Notes (Signed)
ANTICOAGULATION CONSULT NOTE - Initial Consult  Pharmacy Consult for Heparin Indication: atrial fibrillation  Allergies  Allergen Reactions   Amoxicillin Other (See Comments)    Headache, debilitated   Bee Venom Anaphylaxis   Avelox [Moxifloxacin] Other (See Comments)    Unknown   Codeine Other (See Comments)    Unknown    Duloxetine Other (See Comments)    Unknown   Duloxetine Hcl Other (See Comments)    felt weird   Levitra [Vardenafil] Other (See Comments)    Unknown   Morphine And Related Other (See Comments)    hypotension   Penicillin G Sodium Other (See Comments)    Severe headache, fatigue "debilitated"   Testosterone Other (See Comments)    Unknown    Tizanidine Other (See Comments)    Unknown     Viagra [Sildenafil] Other (See Comments)    didn't like it    Patient Measurements: Height: '6\' 1"'$  (185.4 cm) Weight: 93.4 kg (206 lb) IBW/kg (Calculated) : 79.9  Vital Signs: Temp: 102 F (38.9 C) (11/18 2312) Temp Source: Rectal (11/18 2312) BP: 99/58 (11/19 0315) Pulse Rate: 92 (11/19 0315)  Labs: Recent Labs    11/18/21 2029 11/18/21 2114 11/18/21 2122 11/19/21 0253  HGB 7.8*  --  8.2*  --   HCT 25.6*  --  24.0*  --   PLT 171  --   --   --   APTT  --  33  --   --   LABPROT  --  19.5*  --   --   INR  --  1.7*  --   --   HEPARINUNFRC  --   --   --  >1.10*  CREATININE 4.25*  --   --   --     Estimated Creatinine Clearance: 12.3 mL/min (A) (by C-G formula based on SCr of 4.25 mg/dL (H)).   Medical History: Past Medical History:  Diagnosis Date   AAA (abdominal aortic aneurysm) (HCC)    Arthritis    Ascending aorta dilatation ([place]) 03/13/2012   Fusiform dilatation of the ascending thoracic aorta discovered during surgery, max diameter 4.2-4.3 cm   BPH (benign prostatic hypertrophy)    Chronic systolic heart failure ([place]) 03/03/2012   Chronic venous insufficiency    GERD (gastroesophageal reflux disease)    Glaucoma    Gout    History of  kidney stones    Hyperlipidemia    Hypertension    Obesity (BMI 30-39.9)    Paroxysmal atrial fibrillation (HCC) 02/29/2012   Recurrent paroxysmal, new-onset    PMR (polymyalgia rheumatica) (HCC)    Polymyalgia rheumatica ([place]) 02/06/2019   Pre-diabetes    S/P CABG x 3 03/06/2012   LIMA to LAD, SVG to OM, SVG to RCA, EVH via right thigh   S/P [place] operation for atrial fibrillation 03/06/2012   Complete bilateral lesions set using bipolar radiofrequency and cryothermy ablation with clipping of LA appendage   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   s/p TAVR with a 26 mm Edwards via the left subclavian by Drs Buena Irish and Bartle   Severe aortic stenosis 09/29/2019   Sleep apnea    USES CPAP NIGHTLY    Medications:  No current facility-administered medications on file prior to encounter.   Current Outpatient Medications on File Prior to Encounter  Medication Sig Dispense Refill   acetaminophen (TYLENOL) 325 MG tablet Take 2 tablets (650 mg total) by mouth every 4 (four) hours as needed for mild pain (or  temp > 37.5 C (99.5 F)). (Patient taking differently: Take 650 mg by mouth every 4 (four) hours as needed for mild pain.)     albuterol (PROVENTIL) (2.5 MG/3ML) 0.083% nebulizer solution Take 3 mLs (2.5 mg total) by nebulization every 6 (six) hours as needed for wheezing or shortness of breath. 75 mL 12   alum & mag hydroxide-simeth (MAALOX/MYLANTA) 200-200-20 MG/5ML suspension Take 30 mLs by mouth every 2 (two) hours as needed for indigestion or heartburn.     amiodarone (PACERONE) 200 MG tablet Take 1 tablet (200 mg total) by mouth daily. 30 tablet 0   apixaban (ELIQUIS) 2.5 MG TABS tablet Take 2.5 mg by mouth 2 (two) times daily.     aspirin EC 81 MG tablet Take 1 tablet (81 mg total) by mouth daily. Swallow whole. 30 tablet 12   atorvastatin (LIPITOR) 40 MG tablet Take 1 tablet (40 mg total) by mouth daily. 90 tablet 1   bisacodyl (DULCOLAX) 5 MG EC tablet Take 1  tablet (5 mg total) by mouth daily as needed for moderate constipation. 30 tablet 0   cholecalciferol (VITAMIN D3) 25 MCG (1000 UNIT) tablet Take 1 tablet (1,000 Units total) by mouth daily. 30 tablet 0   Cyanocobalamin 5000 MCG SUBL Place 5,000 mcg under the tongue daily.     cyclobenzaprine (FLEXERIL) 5 MG tablet Take 5 mg by mouth 3 (three) times daily as needed for muscle spasms.     denosumab (PROLIA) 60 MG/ML SOSY injection Inject 60 mg into the skin every 6 (six) months.     docusate sodium (COLACE) 100 MG capsule Take 1 capsule (100 mg total) by mouth 2 (two) times daily.     ezetimibe (ZETIA) 10 MG tablet Take 1 tablet (10 mg total) by mouth daily. 30 tablet 0   feeding supplement, GLUCERNA SHAKE, (GLUCERNA SHAKE) LIQD Take 237 mLs by mouth 3 (three) times daily between meals. (Patient taking differently: Take 237 mLs by mouth in the morning and at bedtime.)  0   finasteride (PROSCAR) 5 MG tablet Take 1 tablet (5 mg total) by mouth daily. 30 tablet 0   fish oil-omega-3 fatty acids 1000 MG capsule Take 1 capsule (1 g total) by mouth daily. 30 capsule 0   gabapentin (NEURONTIN) 100 MG capsule Take 2 capsules by mouth in the morning, 1 capsule in the evening, and 2 capsules at bedtime. (Patient taking differently: Take 100-200 mg by mouth See admin instructions. Take 200 mg by mouth in the morning and at bedtime. Take 100 mg by mouth in the evening at 1700) 90 capsule 0   galantamine (RAZADYNE ER) 8 MG 24 hr capsule Take 1 capsule (8 mg total) by mouth daily with breakfast. 30 capsule 0   isosorbide mononitrate (IMDUR) 60 MG 24 hr tablet Take 1 tablet (60 mg total) by mouth daily. 30 tablet 0   leflunomide (ARAVA) 20 MG tablet Take 1/2 tablets (10 mg total) by mouth daily. 30 tablet 0   lidocaine (LIDODERM) 5 % Place 1 patch onto the skin daily. Remove & Discard patch within 12 hours or as directed by MD 30 patch 0   Magnesium Oxide 250 MG TABS Take 250 mg by mouth daily.     memantine  (NAMENDA) 10 MG tablet Take 1 tablet (10 mg total) by mouth 2 (two) times daily. 60 tablet 0   metoprolol succinate (TOPROL-XL) 25 MG 24 hr tablet Take 0.5 tablets (12.5 mg total) by mouth daily.     nitroGLYCERIN (  NITROSTAT) 0.4 MG SL tablet Place 1 tablet (0.4 mg total) under the tongue every 5 (five) minutes x 3 doses as needed for chest pain. 30 tablet 3   nutrition supplement, JUVEN, (JUVEN) PACK Take 1 packet by mouth 2 (two) times daily between meals.  0   pantoprazole (PROTONIX) 40 MG tablet Take 40 mg by mouth daily.     polyethylene glycol (MIRALAX) 17 g packet Take 17 g by mouth daily.     potassium chloride (KLOR-CON M) 10 MEQ tablet Take 1 tablet by mouth everyday with furosemide (Patient taking differently: Take 10 mEq by mouth daily.) 30 tablet 0   predniSONE (DELTASONE) 1 MG tablet Take 3 tablets (3 mg total) by mouth daily with breakfast. 30 tablet 0   Psyllium Husk POWD Take 1 Scoop by mouth daily.      tamsulosin (FLOMAX) 0.4 MG CAPS capsule Take 1 capsule (0.4 mg total) by mouth daily after supper. (Patient taking differently: Take 0.4 mg by mouth every evening.) 30 capsule 0   torsemide (DEMADEX) 20 MG tablet Take 2 tablets (40 mg total) by mouth daily. 60 tablet 0   traMADol (ULTRAM) 50 MG tablet Take 0.5 tablets (25 mg total) by mouth every 6 (six) hours as needed for up to 5 days for moderate pain. 10 tablet 0   triamcinolone cream (KENALOG) 0.1 % Apply 1 Application topically as needed (rash/itching).     vitamin C (ASCORBIC ACID) 500 MG tablet Take 1 tablet (500 mg total) by mouth daily. 30 tablet 0   Zinc 50 MG TABS Take 50 mg by mouth daily.     [DISCONTINUED] cyclobenzaprine (FLEXERIL) 5 MG tablet Take 1 tablet (5 mg total) by mouth 3 (three) times daily as needed for muscle spasms. 30 tablet 0   EPINEPHrine 0.3 mg/0.3 mL IJ SOAJ injection Inject 0.3 mg into the muscle as needed for anaphylaxis. (Patient not taking: Reported on 11/19/2021)      guaiFENesin-dextromethorphan (ROBITUSSIN DM) 100-10 MG/5ML syrup Take 15 mLs by mouth every 4 (four) hours as needed for cough. (Patient not taking: Reported on 11/19/2021) 118 mL 0   [DISCONTINUED] alum & mag hydroxide-simeth (MAALOX/MYLANTA) 200-200-20 MG/5ML suspension Take 15-30 mLs by mouth every 2 (two) hours as needed for indigestion. 355 mL 0   [DISCONTINUED] Cyanocobalamin (VITAMIN B-12) 5000 MCG TBDP Take 5,000 mcg by mouth daily. 30 tablet 0   [DISCONTINUED] Magnesium 250 MG TABS Take 1 tablet (250 mg total) by mouth daily. 30 tablet 0   [DISCONTINUED] potassium chloride (MICRO-K) 10 MEQ CR capsule TAKE ONE CAPSULE BY MOUTH EVERY DAY WITH LASIX (Patient taking differently: Take 10 mEq by mouth daily.) 90 capsule 1   [DISCONTINUED] zinc sulfate 220 (50 Zn) MG capsule Take 1 capsule (220 mg total) by mouth daily.       Assessment: 88 y.o. male admitted with sepsis, h/o Afib and Eliquis on hold, for heparin.  Eliquis last taken 11/17 Goal of Therapy:  aPTT 66-102 seconds Heparin level 0.3-0.7 units/ml Monitor platelets by anticoagulation protocol: Yes   Plan:  Start heparin 1250 units/hr Check heparin level in 8 hours.   Caryl Pina 11/19/2021,4:01 AM

## 2021-11-19 NOTE — Assessment & Plan Note (Signed)
Hold home PO dementia meds for the moment due to AMS / inability to take POs.

## 2021-11-19 NOTE — Progress Notes (Incomplete)
IV team consult placed for USG PIV due to pt. having requirements for vasopressors.  Pt currently has midline and a PIV that place in ED by ultrasound . IV team unable to visualize tip of catheter to verify placement PIV for vasopressors.

## 2021-11-19 NOTE — Assessment & Plan Note (Signed)
Holding off on CPAP due to pt's AMS

## 2021-11-19 NOTE — H&P (Signed)
History and Physical    Patient: [name] [name] IWP:[id number] DOB: [birth year] DOA: 11/18/2021 DOS: the patient was seen and examined on 11/19/2021 PCP: [name], MD  Patient coming from: SNF  Chief Complaint:  Chief Complaint  Patient presents with   Code Sepsis   HPI: [name] [name] is a 88 y.o. male with medical history significant of hypertension, hyperlipidemia, CAD s/p CABG, PAF s/p maze on chronic anticoagulation, HFpEF based on May 2d echo, severe AS s/p TAVR, CVA with residual deficit, PVD, PMR, AAA, OSA on CPAP.  Patient presents to the ED with AMS, fever of 104.9.  Patient is frankly septic, lethargic / unable to respond.  Patient not really able to contribute to history.  Per family pt was slightly "off" his baseline mental status yesterday, but noted to be severely ill today prior to being sent from SNF to ED.   Review of Systems: unable to review all systems due to the inability of the patient to answer questions. Past Medical History:  Diagnosis Date   AAA (abdominal aortic aneurysm) (HCC)    Arthritis    Ascending aorta dilatation ([place]) 03/13/2012   Fusiform dilatation of the ascending thoracic aorta discovered during surgery, max diameter 4.2-4.3 cm   BPH (benign prostatic hypertrophy)    Chronic systolic heart failure ([place]) 03/03/2012   Chronic venous insufficiency    GERD (gastroesophageal reflux disease)    Glaucoma    Gout    History of kidney stones    Hyperlipidemia    Hypertension    Obesity (BMI 30-39.9)    Paroxysmal atrial fibrillation ([place]) 02/29/2012   Recurrent paroxysmal, new-onset    PMR (polymyalgia rheumatica) (HCC)    Polymyalgia rheumatica ([place]) 02/06/2019   Pre-diabetes    S/P CABG x 3 03/06/2012   LIMA to LAD, SVG to OM, SVG to RCA, EVH via right thigh   S/P Maze operation for atrial fibrillation 03/06/2012   Complete bilateral lesions set using bipolar radiofrequency and cryothermy ablation with clipping of LA appendage   S/P TAVR  (transcatheter aortic valve replacement) 11/10/2019   s/p TAVR with a 26 mm Edwards via the left subclavian by Drs Buena Irish and Bartle   Severe aortic stenosis 09/29/2019   Sleep apnea    USES CPAP NIGHTLY   Past Surgical History:  Procedure Laterality Date   ABDOMINAL AORTOGRAM W/LOWER EXTREMITY N/A 10/26/2020   Procedure: ABDOMINAL AORTOGRAM W/LOWER EXTREMITY;  Surgeon: Broadus John, MD;  Location: [facility];  Service: Cardiovascular;  Laterality: N/A;   ABDOMINAL AORTOGRAM W/LOWER EXTREMITY N/A 11/06/2021   Procedure: ABDOMINAL AORTOGRAM W/LOWER EXTREMITY;  Surgeon: Cherre Robins, MD;  Location: [facility];  Service: Cardiovascular;  Laterality: N/A;   AMPUTATION Left 11/08/2021   Procedure: LEFT FOOT 2ND RAY AMPUTATION;  Surgeon: [name], MD;  Location: [place];  Service: Orthopedics;  Laterality: Left;   CARDIAC CATHETERIZATION     CATARACT EXTRACTION, BILATERAL     with lens implants   CHOLECYSTECTOMY N/A 03/24/2012   Procedure: LAPAROSCOPIC CHOLECYSTECTOMY WITH INTRAOPERATIVE CHOLANGIOGRAM;  Surgeon: Zenovia Jarred, MD;  Location: [place];  Service: General;  Laterality: N/A;   CORONARY ARTERY BYPASS GRAFT N/A 03/06/2012   Procedure: CORONARY ARTERY BYPASS GRAFTING (CABG);  Surgeon: Rexene Alberts, MD;  Location: [place];  Service: Open Heart Surgery;  Laterality: N/A;   ENDOVEIN HARVEST OF GREATER SAPHENOUS VEIN Right 03/06/2012   Procedure: ENDOVEIN HARVEST OF GREATER SAPHENOUS VEIN;  Surgeon: Rexene Alberts,  MD;  Location: [facility];  Service: Open Heart Surgery;  Laterality: Right;   INTRAOPERATIVE TRANSESOPHAGEAL ECHOCARDIOGRAM N/A 03/06/2012   Procedure: INTRAOPERATIVE TRANSESOPHAGEAL ECHOCARDIOGRAM;  Surgeon: Rexene Alberts, MD;  Location: [place];  Service: Open Heart Surgery;  Laterality: N/A;   IR KYPHO LUMBAR INC FX REDUCE BONE BX UNI/BIL CANNULATION INC/IMAGING  02/23/2020   LEFT HEART CATH Right 03/02/2012   Procedure: LEFT HEART CATH;  Surgeon: Sherren Mocha, MD;  Location: [facility];  Service: Cardiovascular;  Laterality: Right;   MAZE N/A 03/06/2012   Procedure: MAZE;  Surgeon: Rexene Alberts, MD;  Location: [place];  Service: Open Heart Surgery;  Laterality: N/A;   PERIPHERAL VASCULAR INTERVENTION Right 10/26/2020   Procedure: PERIPHERAL VASCULAR INTERVENTION;  Surgeon: Broadus John, MD;  Location: [place] [facility];  Service: Cardiovascular;  Laterality: Right;   PERIPHERAL VASCULAR INTERVENTION  11/06/2021   Procedure: PERIPHERAL VASCULAR INTERVENTION;  Surgeon: Cherre Robins, MD;  Location: [place] [facility];  Service: Cardiovascular;;   PROSTATECTOMY     partial   RIGHT/LEFT HEART CATH AND CORONARY/GRAFT ANGIOGRAPHY N/A 09/29/2019   Procedure: RIGHT/LEFT HEART CATH AND CORONARY/GRAFT ANGIOGRAPHY;  Surgeon: Adrian Prows, MD;  Location: [place] [facility];  Service: Cardiovascular;  Laterality: N/A;   TEE WITHOUT CARDIOVERSION N/A 11/10/2019   Procedure: TRANSESOPHAGEAL ECHOCARDIOGRAM (TEE);  Surgeon: Burnell Blanks, MD;  Location: [place];  Service: Open Heart Surgery;  Laterality: N/A;   TRANSCATHETER AORTIC VALVE REPLACEMENT, TRANSFEMORAL  11/10/2019   Social History:  reports that he quit smoking about 59 years ago. His smoking use included cigarettes. He has a 7.50 pack-year smoking history. He has never been exposed to tobacco smoke. He has never used smokeless tobacco. He reports current alcohol use of about 2.0 standard drinks of alcohol per week. He reports that he does not use drugs.  Allergies  Allergen Reactions   Amoxicillin Other (See Comments)    Headache, debilitated   Bee Venom Anaphylaxis   Avelox [Moxifloxacin] Other (See Comments)    Unknown   Codeine Other (See Comments)    Unknown    Duloxetine Other (See Comments)    Unknown   Duloxetine Hcl Other (See Comments)    felt weird   Levitra [Vardenafil] Other (See Comments)    Unknown   Morphine And Related Other (See Comments)    hypotension    Penicillin G Sodium Other (See Comments)    Severe headache, fatigue "debilitated"   Testosterone Other (See Comments)    Unknown    Tizanidine Other (See Comments)    Unknown     Viagra [Sildenafil] Other (See Comments)    didn't like it    Family History  Problem Relation Age of Onset   Dementia Mother    Stroke Brother     Prior to Admission medications   Medication Sig Start Date End Date Taking? Authorizing Provider  acetaminophen (TYLENOL) 325 MG tablet Take 2 tablets (650 mg total) by mouth every 4 (four) hours as needed for mild pain (or temp > 37.5 C (99.5 F)). Patient taking differently: Take 650 mg by mouth in the morning and at bedtime. 06/13/21   Angiulli, Lavon Paganini, PA-C  albuterol (PROVENTIL) (2.5 MG/3ML) 0.083% nebulizer solution Take 3 mLs (2.5 mg total) by nebulization every 6 (six) hours as needed for wheezing or shortness of breath. 11/14/21   Terrilee Croak, MD  alum & mag hydroxide-simeth (MAALOX/MYLANTA) 200-200-20 MG/5ML suspension Take 15-30 mLs by mouth every  2 (two) hours as needed for indigestion. 11/14/21   Terrilee Croak, MD  amiodarone (PACERONE) 200 MG tablet Take 1 tablet (200 mg total) by mouth daily. 06/13/21   Angiulli, Lavon Paganini, PA-C  apixaban (ELIQUIS) 2.5 MG TABS tablet Take 2.5 mg by mouth 2 (two) times daily.    [provider]  aspirin EC 81 MG tablet Take 1 tablet (81 mg total) by mouth daily. Swallow whole. 11/15/21   Terrilee Croak, MD  atorvastatin (LIPITOR) 40 MG tablet Take 1 tablet (40 mg total) by mouth daily. 11/03/21   Adrian Prows, MD  bisacodyl (DULCOLAX) 5 MG EC tablet Take 1 tablet (5 mg total) by mouth daily as needed for moderate constipation. 11/14/21   Terrilee Croak, MD  cholecalciferol (VITAMIN D3) 25 MCG (1000 UNIT) tablet Take 1 tablet (1,000 Units total) by mouth daily. 06/13/21   Angiulli, Lavon Paganini, PA-C  Cyanocobalamin (VITAMIN B-12) 5000 MCG TBDP Take 5,000 mcg by mouth daily. 06/13/21   Angiulli, Lavon Paganini, PA-C   cyclobenzaprine (FLEXERIL) 5 MG tablet Take 1 tablet (5 mg total) by mouth 3 (three) times daily as needed for muscle spasms. Patient taking differently: Take 5 mg by mouth at bedtime as needed for muscle spasms. 10/19/21   Kirsteins, Luanna Salk, MD  denosumab (PROLIA) 60 MG/ML SOSY injection Inject 60 mg into the skin every 6 (six) months.    [provider]  docusate sodium (COLACE) 100 MG capsule Take 1 capsule (100 mg total) by mouth 2 (two) times daily. 08/23/20   Hongalgi, Lenis Dickinson, MD  EPINEPHrine 0.3 mg/0.3 mL IJ SOAJ injection Inject 0.3 mg into the muscle as needed for anaphylaxis.    [provider]  ezetimibe (ZETIA) 10 MG tablet Take 1 tablet (10 mg total) by mouth daily. 06/13/21   Angiulli, Lavon Paganini, PA-C  feeding supplement, GLUCERNA SHAKE, (GLUCERNA SHAKE) LIQD Take 237 mLs by mouth 3 (three) times daily between meals. 11/14/21   Terrilee Croak, MD  finasteride (PROSCAR) 5 MG tablet Take 1 tablet (5 mg total) by mouth daily. 06/13/21   Angiulli, Lavon Paganini, PA-C  fish oil-omega-3 fatty acids 1000 MG capsule Take 1 capsule (1 g total) by mouth daily. 06/13/21   Angiulli, Lavon Paganini, PA-C  gabapentin (NEURONTIN) 100 MG capsule Take 2 capsules by mouth in the morning, 1 capsule in the evening, and 2 capsules at bedtime. Patient taking differently: Take 100-200 mg by mouth See admin instructions. Take 200 mg by mouth in the morning and at bedtime. Take 100 mg by mouth in the evening. 06/13/21   Angiulli, Lavon Paganini, PA-C  galantamine (RAZADYNE ER) 8 MG 24 hr capsule Take 1 capsule (8 mg total) by mouth daily with breakfast. 06/13/21   Angiulli, Lavon Paganini, PA-C  guaiFENesin-dextromethorphan (ROBITUSSIN DM) 100-10 MG/5ML syrup Take 15 mLs by mouth every 4 (four) hours as needed for cough. 11/14/21   Terrilee Croak, MD  isosorbide mononitrate (IMDUR) 60 MG 24 hr tablet Take 1 tablet (60 mg total) by mouth daily. 06/13/21   Angiulli, Lavon Paganini, PA-C  leflunomide (ARAVA) 20 MG tablet Take 1/2  tablets (10 mg total) by mouth daily. 06/13/21   Angiulli, Lavon Paganini, PA-C  lidocaine (LIDODERM) 5 % Place 1 patch onto the skin daily. Remove & Discard patch within 12 hours or as directed by MD 11/15/21   Terrilee Croak, MD  Magnesium 250 MG TABS Take 1 tablet (250 mg total) by mouth daily. 06/13/21   Angiulli, Lavon Paganini, PA-C  memantine (  NAMENDA) 10 MG tablet Take 1 tablet (10 mg total) by mouth 2 (two) times daily. 06/13/21   Angiulli, Lavon Paganini, PA-C  metoprolol succinate (TOPROL-XL) 25 MG 24 hr tablet Take 0.5 tablets (12.5 mg total) by mouth daily. 11/15/21   Terrilee Croak, MD  nitroGLYCERIN (NITROSTAT) 0.4 MG SL tablet Place 1 tablet (0.4 mg total) under the tongue every 5 (five) minutes x 3 doses as needed for chest pain. 12/16/19   Cantwell, Celeste C, PA-C  nutrition supplement, JUVEN, (JUVEN) PACK Take 1 packet by mouth 2 (two) times daily between meals. 11/15/21   Terrilee Croak, MD  pantoprazole (PROTONIX) 40 MG tablet Take 40 mg by mouth daily.    [provider]  polyethylene glycol (MIRALAX) 17 g packet Take 17 g by mouth daily.    [provider]  potassium chloride (KLOR-CON M) 10 MEQ tablet Take 1 tablet by mouth everyday with furosemide Patient taking differently: Take 10 mEq by mouth daily. 06/13/21   Angiulli, Lavon Paganini, PA-C  predniSONE (DELTASONE) 1 MG tablet Take 3 tablets (3 mg total) by mouth daily with breakfast. 06/13/21   Angiulli, Lavon Paganini, PA-C  Psyllium Husk POWD Take 1 Scoop by mouth daily.     [provider]  tamsulosin (FLOMAX) 0.4 MG CAPS capsule Take 1 capsule (0.4 mg total) by mouth daily after supper. 06/13/21   Angiulli, Lavon Paganini, PA-C  torsemide (DEMADEX) 20 MG tablet Take 2 tablets (40 mg total) by mouth daily. 06/13/21   Angiulli, Lavon Paganini, PA-C  traMADol (ULTRAM) 50 MG tablet Take 0.5 tablets (25 mg total) by mouth every 6 (six) hours as needed for up to 5 days for moderate pain. 11/14/21 11/19/21  Terrilee Croak, MD  triamcinolone cream  (KENALOG) 0.1 % Apply 1 Application topically as needed (rash/itching).    [provider]  vitamin C (ASCORBIC ACID) 500 MG tablet Take 1 tablet (500 mg total) by mouth daily. 06/13/21   Angiulli, Lavon Paganini, PA-C  zinc sulfate 220 (50 Zn) MG capsule Take 1 capsule (220 mg total) by mouth daily. 11/15/21   Dahal, Marlowe Aschoff, MD  potassium chloride (MICRO-K) 10 MEQ CR capsule TAKE ONE CAPSULE BY MOUTH EVERY DAY WITH LASIX Patient taking differently: Take 10 mEq by mouth daily. 02/16/21 06/13/21  Alethia Berthold, PA-C    Physical Exam: Vitals:   11/18/21 2345 11/19/21 0000 11/19/21 0006 11/19/21 0011  BP: 97/66 116/63    Pulse: 71 (!) 58 97 97  Resp: (!) 24  (!) 23 (!) 26  Temp:      TempSrc:      SpO2: 99%  97% 96%  Weight:      Height:       Constitutional: ill appearing Eyes: PERRL, lids and conjunctivae normal ENMT: Mucous membranes are moist. Posterior pharynx clear of any exudate or lesions.Normal dentition.  Neck: normal, supple, no masses, no thyromegaly Respiratory: Rhonchi present. Normal respiratory effort. No accessory muscle use.  Cardiovascular: Regular rate and rhythm, no murmurs / rubs / gallops. No extremity edema. 2+ pedal pulses. No carotid bruits.  Abdomen: no tenderness, no masses palpated. No hepatosplenomegaly. Bowel sounds positive.  Musculoskeletal: no clubbing / cyanosis. No joint deformity upper and lower extremities. Good ROM, no contractures. Normal muscle tone.  Skin: Amputation site of L 2nd toe with dressing in place, mild erythema, no crepitance, no spreading erythema up leg.  Has large heel and ankle ulcers of R foot, but no obvious surrounding erythema, crepitance, etc.  Vernard Gambles  ulcer of L leg with no surrounding erythema, crepitance, etc. Neurologic: GCS of 6 with no eye opening and no verbal response, does localize and withdraw to pain. Psychiatric: GCS 6 as above   Data Reviewed:    CT head: IMPRESSION: 1. No acute intracranial process. If  there is high clinical concern for small acute infarct, recommend further evaluation with MRI. 2. Chronic infarcts in the left frontal lobe and left operculum. 3. Stable mild chronic small vessel ischemic change of the white matter.     Latest Ref Rng & Units 11/18/2021    9:22 PM 11/18/2021    8:29 PM 11/14/2021    1:20 AM  CBC  WBC 4.0 - 10.5 K/uL  17.5  6.2   Hemoglobin 13.0 - 17.0 g/dL 8.2  7.8  7.7   Hematocrit 39.0 - 52.0 % 24.0  25.6  24.2   Platelets 150 - 400 K/uL  171  169        Latest Ref Rng & Units 11/18/2021    9:22 PM 11/18/2021    8:29 PM 11/14/2021    1:20 AM  CMP  Glucose 70 - 99 mg/dL  140  125   BUN 8 - 23 mg/dL  86  42   Creatinine 0.61 - 1.24 mg/dL  4.25  1.52   Sodium 135 - 145 mmol/L 132  133  137   Potassium 3.5 - 5.1 mmol/L 4.0  4.5  3.9   Chloride 98 - 111 mmol/L  95  102   CO2 22 - 32 mmol/L  21  23   Calcium 8.9 - 10.3 mg/dL  8.8  9.5   Total Protein 6.5 - 8.1 g/dL  6.5    Total Bilirubin 0.3 - 1.2 mg/dL  1.2    Alkaline Phos 38 - 126 U/L  278    AST 15 - 41 U/L  79    ALT 0 - 44 U/L  45      CXR = neg  Urinalysis    Component Value Date/Time   COLORURINE AMBER (A) 11/18/2021 2153   APPEARANCEUR TURBID (A) 11/18/2021 2153   LABSPEC 1.013 11/18/2021 2153   PHURINE 5.0 11/18/2021 2153   GLUCOSEU NEGATIVE 11/18/2021 2153   HGBUR MODERATE (A) 11/18/2021 2153   BILIRUBINUR NEGATIVE 11/18/2021 2153   [place] NEGATIVE 11/18/2021 2153   PROTEINUR 100 (A) 11/18/2021 2153   UROBILINOGEN 1.0 03/22/2012 0845   NITRITE NEGATIVE 11/18/2021 2153   LEUKOCYTESUR LARGE (A) 11/18/2021 2153     Assessment and Plan: * Severe sepsis ([place]) At this point appears to be sepsis secondary to UTI based on UA. DDx of source includes sepsis from foot ulcer(s), 2nd toe amputation site, etc. CXR neg for PNA but does have new 3L O2 requirement today and coarse BS, PNA also possible. Sepsis pathway BCx, UCx pending No soft tissue gas on X ray of L foot, no  spreading erythema, crepitus, etc on either foot or LE. Getting X ray of R foot for that heel ulcer Getting sepsis 30cc/kg IVF bolus followed by 150 cc/hr Trend lactates Tele monitor Getting renal US to r/o hydronephrosis / obstructing stone  AKI (acute kidney injury) ([place]) AKI on CKD 3: Creat today of 4.x up from 1.x baseline Likely pre-renal / ATN in setting of severe sepsis IVF as above Hold nephrotoxic meds Renal US as above to r/o obstruction Repeat CMP in AM  Acute metabolic encephalopathy Delirium secondary to sepsis superimposed on chronic dementia Sepsis treatment as above  Despite severe AMS with GCS 6, patient is NOT a candidate for intubation secondary to DNR status.  Dementia ([place]) Hold home PO dementia meds for the moment due to AMS / inability to take POs.  Skin ulcers of foot, bilateral (HCC) S/p 2nd toe amputation on L foot earlier this month. X rays as above ABx as above May or may not be infected: but no obvious spreading erythema, crepitus, etc at this time.  Chronic diastolic CHF (congestive heart failure) ([place]) Watch for volume overload with sepsis IVF treatment.  Paroxysmal atrial fibrillation (HCC) On amiodarone and eliquis Holding due to inability to take PO Heparin gtt instead of eliquis Amiodarone has a very long half-life (Months), so presumably will not need to address this right away.  Polymyalgia rheumatica (HCC) Chronic steroid use: looks like he takes '3mg'$  prednisone daily based on prior Med rec Med rec for today pending If develops septic shock: consider stress dose steroids Holding off on ordering home PO meds for the moment given AMS. Will need to address chronic steroid use tomorrow  History of CVA with residual deficit Med rec from SNF pending Unable to take PO currently Will put on heparin gtt instead of eliquis for the moment.  Essential hypertension Pt with borderline hypotension today due to severe sepsis. Hold home BP  meds.  OSA (obstructive sleep apnea) Holding off on CPAP due to pt's AMS      Advance Care Planning:   Code Status: DNR Confirmed with family at bedside  Consults: None  Family Communication: Family at bedside  Severity of Illness: The appropriate patient status for this patient is INPATIENT. Inpatient status is judged to be reasonable and necessary in order to provide the required intensity of service to ensure the patient's safety. The patient's presenting symptoms, physical exam findings, and initial radiographic and laboratory data in the context of their chronic comorbidities is felt to place them at high risk for further clinical deterioration. Furthermore, it is not anticipated that the patient will be medically stable for discharge from the hospital within 2 midnights of admission.   * I certify that at the point of admission it is my clinical judgment that the patient will require inpatient hospital care spanning beyond 2 midnights from the point of admission due to high intensity of service, high risk for further deterioration and high frequency of surveillance required.*  Author: Etta Quill., DO 11/19/2021 12:43 AM  For on call review [email or web address].

## 2021-11-19 NOTE — ED Notes (Signed)
MD made aware of patient BP in the 60s.

## 2021-11-19 NOTE — ED Notes (Signed)
IV patency verified prior to medication start. Blood return noted.

## 2021-11-19 NOTE — Progress Notes (Signed)
PHARMACY - PHYSICIAN COMMUNICATION CRITICAL VALUE ALERT - BLOOD CULTURE IDENTIFICATION (BCID)  [name] [name] is an 88 y.o. male who presented to [facility] on 11/18/2021 with a chief complaint of Sepsis  Assessment:  2 of 3 blood cultures with Staph aureus without resistance  Name of physician (or Provider) Contacted: Noe Gens, NP  Current antibiotics: Vancomycin/Cefepime  Changes to prescribed antibiotics recommended:  Change to cefepime monotherapy to cover MSSA in blood as well as UTI. De-escalate as cultures allow.  Results for orders placed or performed during the hospital encounter of 11/18/21  Blood Culture ID Panel (Reflexed) (Collected: 11/18/2021  9:14 PM)  Result Value Ref Range   Enterococcus faecalis NOT DETECTED NOT DETECTED   Enterococcus Faecium NOT DETECTED NOT DETECTED   Listeria monocytogenes NOT DETECTED NOT DETECTED   Staphylococcus species DETECTED (A) NOT DETECTED   Staphylococcus aureus (BCID) DETECTED (A) NOT DETECTED   Staphylococcus epidermidis NOT DETECTED NOT DETECTED   Staphylococcus lugdunensis NOT DETECTED NOT DETECTED   Streptococcus species NOT DETECTED NOT DETECTED   Streptococcus agalactiae NOT DETECTED NOT DETECTED   Streptococcus pneumoniae NOT DETECTED NOT DETECTED   Streptococcus pyogenes NOT DETECTED NOT DETECTED   A.calcoaceticus-baumannii NOT DETECTED NOT DETECTED   Bacteroides fragilis NOT DETECTED NOT DETECTED   Enterobacterales NOT DETECTED NOT DETECTED   Enterobacter cloacae complex NOT DETECTED NOT DETECTED   Escherichia coli NOT DETECTED NOT DETECTED   Klebsiella aerogenes NOT DETECTED NOT DETECTED   Klebsiella oxytoca NOT DETECTED NOT DETECTED   Klebsiella pneumoniae NOT DETECTED NOT DETECTED   Proteus species NOT DETECTED NOT DETECTED   Salmonella species NOT DETECTED NOT DETECTED   Serratia marcescens NOT DETECTED NOT DETECTED   Haemophilus influenzae NOT DETECTED NOT DETECTED   Neisseria meningitidis NOT DETECTED NOT  DETECTED   Pseudomonas aeruginosa NOT DETECTED NOT DETECTED   Stenotrophomonas maltophilia NOT DETECTED NOT DETECTED   Candida albicans NOT DETECTED NOT DETECTED   Candida auris NOT DETECTED NOT DETECTED   Candida glabrata NOT DETECTED NOT DETECTED   Candida krusei NOT DETECTED NOT DETECTED   Candida parapsilosis NOT DETECTED NOT DETECTED   Candida tropicalis NOT DETECTED NOT DETECTED   Cryptococcus neoformans/gattii NOT DETECTED NOT DETECTED   Meth resistant mecA/C and MREJ NOT DETECTED NOT DETECTED    Merrilee Jansky, PharmD Clinical Pharmacist 11/19/2021  1:47 PM

## 2021-11-19 NOTE — Assessment & Plan Note (Signed)
On amiodarone and eliquis Holding due to inability to take PO Heparin gtt instead of eliquis Amiodarone has a very long half-life (Months), so presumably will not need to address this right away.

## 2021-11-19 NOTE — Assessment & Plan Note (Signed)
Chronic steroid use: looks like he takes '3mg'$  prednisone daily based on prior Med rec Med rec for today pending If develops septic shock: consider stress dose steroids Holding off on ordering home PO meds for the moment given AMS. Will need to address chronic steroid use tomorrow

## 2021-11-19 NOTE — Assessment & Plan Note (Signed)
Pt with borderline hypotension today due to severe sepsis. Hold home BP meds.

## 2021-11-19 NOTE — Assessment & Plan Note (Addendum)
Delirium secondary to sepsis superimposed on chronic dementia Sepsis treatment as above  Despite severe AMS with GCS 6, patient is NOT a candidate for intubation secondary to DNR status.

## 2021-11-19 NOTE — Assessment & Plan Note (Signed)
Med rec from SNF pending Unable to take PO currently Will put on heparin gtt instead of eliquis for the moment.

## 2021-11-19 NOTE — ED Notes (Signed)
Patient BP in the low 17B, high 56P systolic. MD made aware.

## 2021-11-19 NOTE — Progress Notes (Addendum)
[facility] Progress Note Patient Name: [name] [name] DOB: 09-22-1926 MRN: [id number]   Date of Service  11/19/2021  HPI/Events of Note  Pain all over. Moaning and groaning. Has IV tylenol ordered but only for fever. Can we have changed to include pain? Or something else PRN? NPO  Camera: On nasal o2. VS stable. On heparin and levophed at 1 mcg/min  Cr >3.7 LA 1.5  UTI/septic shock AKI  eICU Interventions  Tylenol IV ordered once.      Intervention Category Intermediate Interventions: Pain - evaluation and management  Elmer Sow 11/19/2021, 8:12 PM   23:38 Cannot get USGPIV for pressor. Have to infuse through midline.  Ok, watch for extravasation.  2:43 Having pain still. IV tylenol seemed to help a little bit. Try something else? NPO.  Discussed with RN. Alert to self. Hurts all over.  DNR.  - Tylenol PR once ordered.

## 2021-11-19 NOTE — ED Notes (Signed)
Md notified due to Levophed drip at max 7mg/min. MD stated that PCCM will consult.

## 2021-11-19 NOTE — Assessment & Plan Note (Addendum)
S/p 2nd toe amputation on L foot earlier this month. X rays as above ABx as above May or may not be infected: but no obvious spreading erythema, crepitus, etc at this time.

## 2021-11-19 NOTE — Progress Notes (Signed)
At bedside for USGPIV/ vasopressors. Left arm currently has a functioning midline infusing. Assessed right arm. No suitable vessels identified. Veins non-compressible. PIV noted in RAC. Flowsheet identifies a 20G 1.88 USGPIV. Site with good blood return, however, unable to visualize the tip of the catheter for iWatch placement. Primary RN aware. Recommend a central line for further access.

## 2021-11-19 NOTE — ED Notes (Signed)
Wife at bedside at this time.

## 2021-11-19 NOTE — Assessment & Plan Note (Addendum)
At this point appears to be sepsis secondary to UTI based on UA. DDx of source includes sepsis from foot ulcer(s), 2nd toe amputation site, etc. CXR neg for PNA but does have new 3L O2 requirement today and coarse BS, PNA also possible. Sepsis pathway BCx, UCx pending No soft tissue gas on X ray of L foot, no spreading erythema, crepitus, etc on either foot or LE. Getting X ray of R foot for that heel ulcer Getting sepsis 30cc/kg IVF bolus followed by 150 cc/hr Trend lactates Tele monitor Getting renal US to r/o hydronephrosis / obstructing stone

## 2021-11-19 NOTE — Significant Event (Signed)
Patient admitted this morning for septic shock likely from UTI.  Does have multiorgan failure with acute renal failure metabolic encephalopathy lactic acidosis.  Received around 4 L of IV fluids with persistent hypotension.  Altered mental status.  Seen at bedside.  Patient is critically ill.  Spoke with patient's wife who is aware of his declining condition.  I consulted PCCM team and has been taken over at this time since patient has been started on vasopressors in the ED.  Patient would benefit from ongoing discussion about goals of care.

## 2021-11-19 NOTE — ED Notes (Signed)
Md made aware about patients 2nd loose stool in 20 minutes. Patient cleaned at this time. This paramedic attempted rectal tylenol with no success. Md made aware. Md placed IV tylenol order.

## 2021-11-19 NOTE — Consult Note (Signed)
[facility]    Date of Admission:  11/18/2021   Total days of inpatient antibiotics 1        Reason for Consult: MSSA bacteremia    Principal Problem:   Severe sepsis ([place]) Active Problems:   CAD (coronary artery disease), native coronary artery   Essential hypertension   Paroxysmal atrial fibrillation (HCC)   OSA (obstructive sleep apnea)   Chronic diastolic CHF (congestive heart failure) (HCC)   CKD (chronic kidney disease) stage 3, GFR 30-59 ml/min (HCC)   AKI (acute kidney injury) ([place])   Dementia (HCC)   Polymyalgia rheumatica (HCC)   Skin ulcers of foot, bilateral ([place])   History of CVA with residual deficit   Acute metabolic encephalopathy   Sepsis secondary to UTI ([place])   Septic shock ([place])   Assessment: 88 YM with recent hospitalization for necrotic left foot second toe ulcer requiring left fem pop angioplasty w/ stent on 11/6 followed by left  second ray amputation on 11/8 hospital course complicated by superficial vein previous IV site admitted for sepsis secondary to possible UTI.  ID engaged was found to have MSSA bacteremia   #MSSA bacteremia likely secondary to PIV/thrombophlebitis from prior hospitalization #Aortic stenosis status post TAVR #Urine cultures growing GNR #Left 2nd ray amputation due to osteomyelitis on 11/8 #Left fem pop angioplasty on 11/6 with stent -Patient had temp of 104.9, WBC 18 K on arrival.  Started on vancomycin, metronidazole, cefepime. 11/18 1/1 MSSA, 11/19 1/1 pending -11/10 right UE US showed findings consistent with acute superficial vein thrombosis involving the right basilic vein and right cephalic vein.  I suspect this is consistent with the bacteremia. -Chest x-ray did not show acute disease.  Left foot x-ray no acute bony abnormality.  Renal ultrasound showed mild general thickening of bladder with debris's in the bladderTrabeculation of the bladder since CT on 03/14/2021 Recommendations:   D/C vancomycin and metronidazole(ok to D/C tomorrow AM) Continue cefepime while awaiting urine Cx.  He has grown Enterobacter and Pseudomonas from urine cultures in the past. Follow blood Cx Repeat blood Cx tomorrow to ensure clearance TTE, will need TEE  .I have personally spent 115 minutes involved in face-to-face and non-face-to-face activities for this patient on the day of the visit. Professional time spent includes the following activities: Preparing to see the patient (review of tests), Obtaining and/or reviewing separately obtained history (admission/discharge record), Performing a medically appropriate examination and/or evaluation , Ordering medications/tests/procedures, referring and communicating with other health care professionals, Documenting clinical information in the EMR, Independently interpreting results (not separately reported), Communicating results to the patient/family/caregiver, Counseling and educating the patient/family/caregiver and Care coordination (not separately reported).    Microbiology:   Antibiotics: Vancomcyin, cefepime and metronidazole   Cultures: Blood 11/18 1/1 GPC 11/19 Urine Ucx pending Other   HPI: [name] [name] is a 88 y.o. male with past medical history of hypertension, hyperlipidemia, CAD status post CABG, PAF status post maze on chronic anticoagulation, heart failure, aortic stenosis status post TAVR, CVA, PVD, PMR, AAA admitted with sepsis suspected secondary to UTI.  Patient presents to ED with temp 100.4, lethargic unable to respond.  He presented from SNF.  Blood cultures returned positive for MSSA infectious disease engaged. Of note he had a recent hospitalization 11/2 to 11/14 at [facility] for necrotic left foot ulcer status post left foot second ray amputation on 11/8.  Hospital course with: Admitted for acute superficial vein thrombosis right  basilic vein at site of previous IV access.  Review of Systems: Review of Systems  All  other systems reviewed and are negative.   Past Medical History:  Diagnosis Date   AAA (abdominal aortic aneurysm) (HCC)    Arthritis    Ascending aorta dilatation ([place]) 03/13/2012   Fusiform dilatation of the ascending thoracic aorta discovered during surgery, max diameter 4.2-4.3 cm   BPH (benign prostatic hypertrophy)    Chronic systolic heart failure ([place]) 03/03/2012   Chronic venous insufficiency    GERD (gastroesophageal reflux disease)    Glaucoma    Gout    History of kidney stones    Hyperlipidemia    Hypertension    Obesity (BMI 30-39.9)    Paroxysmal atrial fibrillation (HCC) 02/29/2012   Recurrent paroxysmal, new-onset    PMR (polymyalgia rheumatica) (HCC)    Polymyalgia rheumatica (HCC) 02/06/2019   Pre-diabetes    S/P CABG x 3 03/06/2012   LIMA to LAD, SVG to OM, SVG to RCA, EVH via right thigh   S/P Maze operation for atrial fibrillation 03/06/2012   Complete bilateral lesions set using bipolar radiofrequency and cryothermy ablation with clipping of LA appendage   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   s/p TAVR with a 26 mm Edwards via the left subclavian by Drs Buena Irish and Bartle   Severe aortic stenosis 09/29/2019   Sleep apnea    USES CPAP NIGHTLY    Social History   Tobacco Use   Smoking status: Former    Packs/day: 0.50    Years: 15.00    Total pack years: 7.50    Types: Cigarettes    Quit date: 11/18/1962    Years since quitting: 59.0    Passive exposure: Never   Smokeless tobacco: Never  Vaping Use   Vaping Use: Never used  Substance Use Topics   Alcohol use: Yes    Alcohol/week: 2.0 standard drinks of alcohol    Types: 2 Shots of liquor per week    Comment: daily, not in last few days   Drug use: Never    Family History  Problem Relation Age of Onset   Dementia Mother    Stroke Brother    Scheduled Meds:  Chlorhexidine Gluconate Cloth  6 each Topical Daily   pantoprazole (PROTONIX) IV  40 mg Intravenous  Daily   sodium chloride flush  10-40 mL Intracatheter Q12H   Continuous Infusions:  sodium chloride 150 mL/hr at 11/19/21 1900   sodium chloride Stopped (11/19/21 0808)   acetaminophen     [START ON 11/20/2021] ceFEPime (MAXIPIME) IV     heparin 1,000 Units/hr (11/19/21 1900)   norepinephrine (LEVOPHED) Adult infusion Stopped (11/19/21 1809)   PRN Meds:.acetaminophen, ondansetron **OR** ondansetron (ZOFRAN) IV, sodium chloride flush Allergies  Allergen Reactions   Amoxicillin Other (See Comments)    Headache, debilitated   Bee Venom Anaphylaxis   Avelox [Moxifloxacin] Other (See Comments)    Unknown   Codeine Other (See Comments)    Unknown    Duloxetine Other (See Comments)    Unknown   Duloxetine Hcl Other (See Comments)    felt weird   Levitra [Vardenafil] Other (See Comments)    Unknown   Morphine And Related Other (See Comments)    hypotension   Penicillin G Sodium Other (See Comments)    Severe headache, fatigue "debilitated"   Testosterone Other (See Comments)    Unknown    Tizanidine Other (See Comments)    Unknown  Viagra [Sildenafil] Other (See Comments)    didn't like it    OBJECTIVE: Blood pressure 103/67, pulse 87, temperature 98.2 F (36.8 C), temperature source Axillary, resp. rate (!) 22, height '6\' 1"'$  (1.854 m), weight 93.4 kg, SpO2 100 %.  Physical Exam Constitutional:      General: He is not in acute distress.    Appearance: He is normal weight. He is not toxic-appearing.  HENT:     Head: Normocephalic and atraumatic.     Right Ear: External ear normal.     Left Ear: External ear normal.     Nose: No congestion or rhinorrhea.     Mouth/Throat:     Mouth: Mucous membranes are moist.     Pharynx: Oropharynx is clear.  Eyes:     Extraocular Movements: Extraocular movements intact.     Conjunctiva/sclera: Conjunctivae normal.     Pupils: Pupils are equal, round, and reactive to light.  Cardiovascular:     Rate and Rhythm: Normal rate and  regular rhythm.     Heart sounds: No murmur heard.    No friction rub. No gallop.  Pulmonary:     Effort: Pulmonary effort is normal.     Breath sounds: Normal breath sounds.  Abdominal:     General: Abdomen is flat. Bowel sounds are normal.     Palpations: Abdomen is soft.  Musculoskeletal:        General: No swelling. Normal range of motion.     Cervical back: Normal range of motion and neck supple.  Skin:    General: Skin is warm and dry.     Comments: Right extensor forarm tenderness and selling at old IV site  Left foot wound C/D/I  Neurological:     General: No focal deficit present.     Mental Status: He is oriented to person, place, and time.  Psychiatric:        Mood and Affect: Mood normal.     Lab Results Lab Results  Component Value Date   WBC 18.1 (H) 11/19/2021   HGB 7.0 (L) 11/19/2021   HCT 23.1 (L) 11/19/2021   MCV 92.0 11/19/2021   PLT 110 (L) 11/19/2021    Lab Results  Component Value Date   CREATININE 3.70 (H) 11/19/2021   BUN 81 (H) 11/19/2021   NA 134 (L) 11/19/2021   K 3.8 11/19/2021   CL 103 11/19/2021   CO2 19 (L) 11/19/2021    Lab Results  Component Value Date   ALT 40 11/19/2021   AST 84 (H) 11/19/2021   ALKPHOS 209 (H) 11/19/2021   BILITOT 0.8 11/19/2021       Laurice Record, [facility] 11/19/2021, 8:57 PM

## 2021-11-19 NOTE — ED Notes (Signed)
Patient BP continues to stay in the 80s despite 500cc bolus. MD gardner at bedside. MD advised to get typing screen. IV tylenol came from pharmacy. Patient has heparin and NS running through [facility]. MD advised about patient access problem.

## 2021-11-19 NOTE — Assessment & Plan Note (Signed)
Watch for volume overload with sepsis IVF treatment.

## 2021-11-19 NOTE — Progress Notes (Signed)
Family updated at bedside on patient status.  Labs reviewed > improvement in serum creatinine from 4.25 to 3.7 after IVF's. Patient able to wean off levophed.  Mental status with improvement > patient able to answer yes/no questions, indicates pain in his back (chronic / hx of kyphoplasty), and states hello to family.  Reviewed continued non-invasive medical support for patient given clinical improvement. If he has ongoing improvement, would need ECHO to evaluate for endocarditis at some point.  Pending clinical course, if further decline, family would be likely amenable to transition to comfort measures after discussion / update.      Noe Gens, MSN, APRN, NP-C, AGACNP-BC [facility] 11/19/2021, 6:51 PM   Please see [email or web address] for pager details.   From 7A-7P if no response, please call [phone] After hours, please call ELink [phone]

## 2021-11-19 NOTE — ED Provider Notes (Signed)
Blood pressure (!) 89/52, pulse 72, temperature (!) 101.1 F (38.4 C), temperature source Rectal, resp. rate (!) 21, height '6\' 1"'$  (1.854 m), weight 93.4 kg, SpO2 100 %.  In short, [name] [name] is a 88 y.o. male with a chief complaint of Code Sepsis .  Refer to the original H&P for additional details.  06:31 AM Patient boarding in the ED under [facility] care. Nursing made aware that they would benefit from additional IV access. Patient DNR/DNI. I was able to obtain PIV access with US guidance.   Emergency Ultrasound Study:   Angiocath insertion Performed by: Wonda Olds Rashell Shambaugh  Indication: difficult IV access Preparation: Patient was prepped and draped in the usual sterile fashion. Vein Location: Right AC was visualized during assessment for potential access sites and was found to be patent/ easily compressed with linear ultrasound.  The needle was visualized with real-time ultrasound and guided into the vein. Gauge: 20  Normal blood return.  Patient tolerance: Patient tolerated the procedure well with no immediate complications.       Margette Fast, MD 11/19/21 [phone]

## 2021-11-19 NOTE — ED Notes (Signed)
Patient temp checked rectally. Large amount of loose stool in brief. Patient bed and brief changed and replaced with new. Patient covered with a sheet due to 101 temp.

## 2021-11-19 NOTE — Progress Notes (Signed)
PHARMACY NOTE:  ANTIMICROBIAL RENAL DOSAGE ADJUSTMENT  Current antimicrobial regimen includes a mismatch between antimicrobial dosage and estimated renal function.  As per policy approved by the Pharmacy & Therapeutics and Medical Executive Committees, the antimicrobial dosage will be adjusted accordingly.  Current antimicrobial dosage:  Cefepime 2g Q12H  Indication: Bacteremia and UTI  Renal Function: in AKI with CrCl 10-20 mL/min. UOP minimal.   Estimated Creatinine Clearance: 12.9 mL/min (A) (by C-G formula based on SCr of 4.05 mg/dL (H)). '[]'$      On intermittent HD, scheduled: '[]'$      On CRRT    Antimicrobial dosage has been changed to:  Cefepime 2g Q24H   Thank you for allowing pharmacy to be a part of this patient's care.  Erskine Speed, PharmD Clinical Pharmacist 11/19/2021 1:48 PM

## 2021-11-19 NOTE — Consult Note (Signed)
NAME:  [name] [name], MRN:  [id number], DOB:  02/13/1926, LOS: 0 ADMISSION DATE:  11/18/2021, CONSULTATION DATE:  11/19 REFERRING MD:  Dr. Louanne Belton, CHIEF COMPLAINT: AMS, fever    History of Present Illness:  88 y/o M who presented to [facility] ER on 11/18 with reports of AMS & fever from SNF.   Recent admission from 11/2-11/4 for peripheral vascular disease s/p femoral-popliteal angioplasty and stenting on 11/6 and bilateral foot wounds with s/p left second ray amputation per Dr. Sharol Given on 11/8.    Presented to ER with reports of altered mental status and fever from SNF.  Wife reports they have been concerned over his urine for the past several days and has been taking it to the office out of concern.  Rectal temperature on admission noted to be 104.9.  Family noted him to be slightly confused in the early part of the week prior to admission.  This progressed to lethargy and then somnolence.  EMS was activated 11/18 and found him to be hypoxic requiring 3 L O2.  Patient is known DNR/DNI with paperwork accompanying him to the hospital.  CXR essentially clear on presentation.  X-ray of left foot without bony abnormality.  UA concerning for UTI with turbid appearance, large leukocytes, 100 protein, many bacteria, greater than 50 WBC.  Initial labs-NA 133, K4.5, CL 95, CO2 21, glucose 140, BUN 86, creatinine 4.25, alk phos 278, albumin 2, AST 79, ALT 45, WBC 17.5, Hgb 17.8, platelets 171.  Lactic acid 3.1.  Influenza and COVID screening negative.  Blood cultures with GPC's in clusters on Gram stain.  In the emergency room he was treated with 3 L LR bolus followed by infusion at 150/h.  He was pancultured and empirically treated with cefepime, Flagyl and vancomycin.  Despite IVF, patient's blood pressure persisted with 84Y systolic and was started on Levophed peripherally per TR[facility].  PCCM consulted for septic shock.  Pertinent  Medical History  CAD s/p CABG S/p maze for AF Severe aortic stenosis s/p  TAVR AAA Chronic systolic heart failure HTN HLD Former Smoker  Obesity PMR PVD s/p femoral-popliteal angioplasty and stenting 11/06/2021 OSA-on CPAP Gout Prediabetes BPH  Significant Hospital Events: Including procedures, antibiotic start and stop dates in addition to other pertinent events   11/18 Admit with concern for septic shock in setting of UTI  Interim History / Subjective:  Family at bedside  Febrile to 101.1   Objective   Blood pressure (!) 102/51, pulse 70, temperature (!) 101.1 F (38.4 C), temperature source Rectal, resp. rate 20, height _0  (1.854 m), weight 93.4 kg, SpO2 100 %.        Intake/Output Summary (Last 24 hours) at 11/19/2021 0948 Last data filed at 11/19/2021 6599 Gross per 24 hour  Intake 3619.38 ml  Output --  Net 3619.38 ml   Filed Weights   11/18/21 1914  Weight: 93.4 kg    Examination: General: elderly male lying in bed in NAD HENT: MM pink/dry, anicteric, cheeks flushed  Lungs: non-labored at rest, lungs bilaterally clear with good air entry  Cardiovascular: s1s2 RRR, no m/r/g Abdomen: non-distended, bsx4 active  Extremities: warm/dry, BLE with changes c/w chronic venous stasis, circular scab on LLE Neuro: moans when name called, does not follow commands, no interaction   Resolved Hospital Problem list      Assessment & Plan:   Septic Shock secondary to Staph Bacteremia, possible UTI  MSSA detected on prelim blood cultures / BCID.  Additional possible UTI.  -  admit to ICU  -continue levophed, discussed with family > would not offer central access as risk outweigh benefit  -follow cultures  -continue cefepime, vancomycin  -concern for poor prognosis  -continue IVF, peripheral levo, abx. No further escalation at this time. If improves, will revisit plan.   AKI secondary to Sepsis/Shock  -Trend BMP / urinary output -follow up BMP at 1500, pending renal function, may need to transition to comfort measures  -Replace  electrolytes as indicated -Avoid nephrotoxic agents, ensure adequate renal perfusion  Recent Fem-Pop Stent Placement  PVD -hold home eliquis, imdur -continue heparin per pharmacy for now (started on admit per TRH)  CAD, s/p CABG, AS s/p TAVR, MAZE  -hold home agents given hypotension  -levophed as above  -hold home amiodarone, lipitor, toprolol, demadex   Baseline Dementia with Acute Metabolic Encephalopathy  -supportive care -hold home namenda   PMR  -on 32m Predinsone QD -pending response, may need stress dose steroids   BPH -hold flomax   Best Practice (right click and "Reselect all SmartList Selections" daily)  Diet/type: NPO DVT prophylaxis: systemic heparin GI prophylaxis: PPI Lines: N/A Foley:  N/A Code Status:  DNR Last date of multidisciplinary goals of care discussion: goals of care on 11/19 per Dr. IValeta Harms NP at bedside > family understand gravity of multisystem organ failure, staph infection and indicate they would not want further aggressive measures.  Discussed possibility that he may not survive this admission.  DNR/DNI. No central access. Continue peripheral vasopressors.   Labs   CBC: Recent Labs  Lab 11/13/21 0057 11/14/21 0120 11/18/21 2029 11/18/21 2122 11/19/21 0525  WBC 6.5 6.2 17.5*  --  18.1*  NEUTROABS  --  4.4 16.3*  --   --   HGB 7.7* 7.7* 7.8* 8.2* 7.0*  HCT 24.6* 24.2* 25.6* 24.0* 23.1*  MCV 92.1 89.3 90.8  --  92.0  PLT 143* 169 171  --  110*    Basic Metabolic Panel: Recent Labs  Lab 11/13/21 0057 11/14/21 0120 11/18/21 2029 11/18/21 2122 11/19/21 0433  NA 138 137 133* 132* 135  K 4.1 3.9 4.5 4.0 4.3  CL 109 102 95*  --  98  CO2 21* 23 21*  --  17*  GLUCOSE 123* 125* 140*  --  137*  BUN 40* 42* 86*  --  83*  CREATININE 1.43* 1.52* 4.25*  --  4.05*  CALCIUM 9.3 9.5 8.8*  --  8.4*   GFR: Estimated Creatinine Clearance: 12.9 mL/min (A) (by C-G formula based on SCr of 4.05 mg/dL (H)). Recent Labs  Lab 11/13/21 0057  11/14/21 0120 11/18/21 2029 11/18/21 2326 11/19/21 0433 11/19/21 0525  PROCALCITON  --   --   --   --  112.32  --   WBC 6.5 6.2 17.5*  --   --  18.1*  LATICACIDVEN  --   --  3.1* 4.0* 3.4*  --     Liver Function Tests: Recent Labs  Lab 11/18/21 2029 11/19/21 0433  AST 79* 90*  ALT 45* 44  ALKPHOS 278* 207*  BILITOT 1.2 1.0  PROT 6.5 5.6*  ALBUMIN 2.0* 1.6*   No results for input(s): "LIPASE", "AMYLASE" in the last 168 hours. No results for input(s): "AMMONIA" in the last 168 hours.  ABG    Component Value Date/Time   PHART 7.422 11/06/2019 0946   PCO2ART 36.5 11/06/2019 0946   PO2ART 105 11/06/2019 0946   HCO3 21.3 11/18/2021 2122   TCO2 22 11/18/2021 2122   ACIDBASEDEF  4.0 (H) 11/18/2021 2122   O2SAT 75 11/18/2021 2122     Coagulation Profile: Recent Labs  Lab 11/18/21 2114 11/19/21 0433  INR 1.7* 1.8*    Cardiac Enzymes: No results for input(s): "CKTOTAL", "CKMB", "CKMBINDEX", "TROPONINI" in the last 168 hours.  HbA1C: Hgb A1c MFr Bld  Date/Time Value Ref Range Status  05/25/2021 11:24 AM 5.8 (H) 4.8 - 5.6 % Final    Comment:    (NOTE) Pre diabetes:          5.7%-6.4%  Diabetes:              >6.4%  Glycemic control for   <7.0% adults with diabetes   11/06/2019 09:27 AM 6.1 (H) 4.8 - 5.6 % Final    Comment:    (NOTE) Pre diabetes:          5.7%-6.4%  Diabetes:              >6.4%  Glycemic control for   <7.0% adults with diabetes     CBG: No results for input(s): "GLUCAP" in the last 168 hours.  Review of Systems:   Unable to complete with patient due to AMS in setting of septic shock.  Information obtained from staff, chart review and family at bedside.   Past Medical History:  He,  has a past medical history of AAA (abdominal aortic aneurysm) ([place]), Arthritis, Ascending aorta dilatation ([place]) (03/13/2012), BPH (benign prostatic hypertrophy), Chronic systolic heart failure ([place]) (03/03/2012), Chronic venous insufficiency, GERD  (gastroesophageal reflux disease), Glaucoma, Gout, History of kidney stones, Hyperlipidemia, Hypertension, Obesity (BMI 30-39.9), Paroxysmal atrial fibrillation ([place]) (02/29/2012), PMR (polymyalgia rheumatica) ([place]), Polymyalgia rheumatica ([place]) (02/06/2019), Pre-diabetes, S/P CABG x 3 (03/06/2012), S/P Maze operation for atrial fibrillation (03/06/2012), S/P TAVR (transcatheter aortic valve replacement) (11/10/2019), Severe aortic stenosis (09/29/2019), and Sleep apnea.   Surgical History:   Past Surgical History:  Procedure Laterality Date   ABDOMINAL AORTOGRAM W/LOWER EXTREMITY N/A 10/26/2020   Procedure: ABDOMINAL AORTOGRAM W/LOWER EXTREMITY;  Surgeon: Broadus John, MD;  Location: [place] [facility];  Service: Cardiovascular;  Laterality: N/A;   ABDOMINAL AORTOGRAM W/LOWER EXTREMITY N/A 11/06/2021   Procedure: ABDOMINAL AORTOGRAM W/LOWER EXTREMITY;  Surgeon: Cherre Robins, MD;  Location: [place] [facility];  Service: Cardiovascular;  Laterality: N/A;   AMPUTATION Left 11/08/2021   Procedure: LEFT FOOT 2ND RAY AMPUTATION;  Surgeon: [name], MD;  Location: [place];  Service: Orthopedics;  Laterality: Left;   CARDIAC CATHETERIZATION     CATARACT EXTRACTION, BILATERAL     with lens implants   CHOLECYSTECTOMY N/A 03/24/2012   Procedure: LAPAROSCOPIC CHOLECYSTECTOMY WITH INTRAOPERATIVE CHOLANGIOGRAM;  Surgeon: Zenovia Jarred, MD;  Location: [place];  Service: General;  Laterality: N/A;   CORONARY ARTERY BYPASS GRAFT N/A 03/06/2012   Procedure: CORONARY ARTERY BYPASS GRAFTING (CABG);  Surgeon: Rexene Alberts, MD;  Location: [place];  Service: Open Heart Surgery;  Laterality: N/A;   ENDOVEIN HARVEST OF GREATER SAPHENOUS VEIN Right 03/06/2012   Procedure: ENDOVEIN HARVEST OF GREATER SAPHENOUS VEIN;  Surgeon: Rexene Alberts, MD;  Location: [place];  Service: Open Heart Surgery;  Laterality: Right;   INTRAOPERATIVE TRANSESOPHAGEAL ECHOCARDIOGRAM N/A 03/06/2012   Procedure: INTRAOPERATIVE  TRANSESOPHAGEAL ECHOCARDIOGRAM;  Surgeon: Rexene Alberts, MD;  Location: [place];  Service: Open Heart Surgery;  Laterality: N/A;   IR KYPHO LUMBAR INC FX REDUCE BONE BX UNI/BIL CANNULATION INC/IMAGING  02/23/2020   LEFT HEART CATH Right 03/02/2012   Procedure: LEFT HEART CATH;  Surgeon:  Sherren Mocha, MD;  Location: [facility];  Service: Cardiovascular;  Laterality: Right;   MAZE N/A 03/06/2012   Procedure: MAZE;  Surgeon: Rexene Alberts, MD;  Location: [place];  Service: Open Heart Surgery;  Laterality: N/A;   PERIPHERAL VASCULAR INTERVENTION Right 10/26/2020   Procedure: PERIPHERAL VASCULAR INTERVENTION;  Surgeon: Broadus John, MD;  Location: [place] [facility];  Service: Cardiovascular;  Laterality: Right;   PERIPHERAL VASCULAR INTERVENTION  11/06/2021   Procedure: PERIPHERAL VASCULAR INTERVENTION;  Surgeon: Cherre Robins, MD;  Location: [place] [facility];  Service: Cardiovascular;;   PROSTATECTOMY     partial   RIGHT/LEFT HEART CATH AND CORONARY/GRAFT ANGIOGRAPHY N/A 09/29/2019   Procedure: RIGHT/LEFT HEART CATH AND CORONARY/GRAFT ANGIOGRAPHY;  Surgeon: Adrian Prows, MD;  Location: [place] [facility];  Service: Cardiovascular;  Laterality: N/A;   TEE WITHOUT CARDIOVERSION N/A 11/10/2019   Procedure: TRANSESOPHAGEAL ECHOCARDIOGRAM (TEE);  Surgeon: Burnell Blanks, MD;  Location: [place];  Service: Open Heart Surgery;  Laterality: N/A;   TRANSCATHETER AORTIC VALVE REPLACEMENT, TRANSFEMORAL  11/10/2019     Social History:   reports that he quit smoking about 59 years ago. His smoking use included cigarettes. He has a 7.50 pack-year smoking history. He has never been exposed to tobacco smoke. He has never used smokeless tobacco. He reports current alcohol use of about 2.0 standard drinks of alcohol per week. He reports that he does not use drugs.   Family History:  His family history includes Dementia in his mother; Stroke in his brother.   Allergies Allergies  Allergen Reactions    Amoxicillin Other (See Comments)    Headache, debilitated   Bee Venom Anaphylaxis   Avelox [Moxifloxacin] Other (See Comments)    Unknown   Codeine Other (See Comments)    Unknown    Duloxetine Other (See Comments)    Unknown   Duloxetine Hcl Other (See Comments)    felt weird   Levitra [Vardenafil] Other (See Comments)    Unknown   Morphine And Related Other (See Comments)    hypotension   Penicillin G Sodium Other (See Comments)    Severe headache, fatigue "debilitated"   Testosterone Other (See Comments)    Unknown    Tizanidine Other (See Comments)    Unknown     Viagra [Sildenafil] Other (See Comments)    didn't like it     Home Medications  Prior to Admission medications   Medication Sig Start Date End Date Taking? Authorizing Provider  acetaminophen (TYLENOL) 325 MG tablet Take 2 tablets (650 mg total) by mouth every 4 (four) hours as needed for mild pain (or temp > 37.5 C (99.5 F)). Patient taking differently: Take 650 mg by mouth every 4 (four) hours as needed for mild pain. 06/13/21  Yes Angiulli, Lavon Paganini, PA-C  albuterol (PROVENTIL) (2.5 MG/3ML) 0.083% nebulizer solution Take 3 mLs (2.5 mg total) by nebulization every 6 (six) hours as needed for wheezing or shortness of breath. 11/14/21  Yes Dahal, Marlowe Aschoff, MD  alum & mag hydroxide-simeth (MAALOX/MYLANTA) 200-200-20 MG/5ML suspension Take 30 mLs by mouth every 2 (two) hours as needed for indigestion or heartburn.   Yes [provider]  amiodarone (PACERONE) 200 MG tablet Take 1 tablet (200 mg total) by mouth daily. 06/13/21  Yes Angiulli, Lavon Paganini, PA-C  apixaban (ELIQUIS) 2.5 MG TABS tablet Take 2.5 mg by mouth 2 (two) times daily.   Yes [provider]  aspirin EC 81 MG tablet Take 1  tablet (81 mg total) by mouth daily. Swallow whole. 11/15/21  Yes Dahal, Marlowe Aschoff, MD  atorvastatin (LIPITOR) 40 MG tablet Take 1 tablet (40 mg total) by mouth daily. 11/03/21  Yes Adrian Prows, MD  bisacodyl (DULCOLAX) 5 MG  EC tablet Take 1 tablet (5 mg total) by mouth daily as needed for moderate constipation. 11/14/21  Yes Dahal, Marlowe Aschoff, MD  cholecalciferol (VITAMIN D3) 25 MCG (1000 UNIT) tablet Take 1 tablet (1,000 Units total) by mouth daily. 06/13/21  Yes Angiulli, Lavon Paganini, PA-C  Cyanocobalamin 5000 MCG SUBL Place 5,000 mcg under the tongue daily.   Yes [provider]  cyclobenzaprine (FLEXERIL) 5 MG tablet Take 5 mg by mouth 3 (three) times daily as needed for muscle spasms.   Yes [provider]  denosumab (PROLIA) 60 MG/ML SOSY injection Inject 60 mg into the skin every 6 (six) months.   Yes [provider]  docusate sodium (COLACE) 100 MG capsule Take 1 capsule (100 mg total) by mouth 2 (two) times daily. 08/23/20  Yes Hongalgi, Lenis Dickinson, MD  ezetimibe (ZETIA) 10 MG tablet Take 1 tablet (10 mg total) by mouth daily. 06/13/21  Yes Angiulli, Lavon Paganini, PA-C  feeding supplement, GLUCERNA SHAKE, (GLUCERNA SHAKE) LIQD Take 237 mLs by mouth 3 (three) times daily between meals. Patient taking differently: Take 237 mLs by mouth in the morning and at bedtime. 11/14/21  Yes Dahal, Marlowe Aschoff, MD  finasteride (PROSCAR) 5 MG tablet Take 1 tablet (5 mg total) by mouth daily. 06/13/21  Yes Angiulli, Lavon Paganini, PA-C  fish oil-omega-3 fatty acids 1000 MG capsule Take 1 capsule (1 g total) by mouth daily. 06/13/21  Yes Angiulli, Lavon Paganini, PA-C  gabapentin (NEURONTIN) 100 MG capsule Take 2 capsules by mouth in the morning, 1 capsule in the evening, and 2 capsules at bedtime. Patient taking differently: Take 100-200 mg by mouth See admin instructions. Take 200 mg by mouth in the morning and at bedtime. Take 100 mg by mouth in the evening at 1700 06/13/21  Yes Angiulli, Lavon Paganini, PA-C  galantamine (RAZADYNE ER) 8 MG 24 hr capsule Take 1 capsule (8 mg total) by mouth daily with breakfast. 06/13/21  Yes Angiulli, Lavon Paganini, PA-C  isosorbide mononitrate (IMDUR) 60 MG 24 hr tablet Take 1 tablet (60 mg total) by mouth  daily. 06/13/21  Yes Angiulli, Lavon Paganini, PA-C  leflunomide (ARAVA) 20 MG tablet Take 1/2 tablets (10 mg total) by mouth daily. 06/13/21  Yes Angiulli, Lavon Paganini, PA-C  lidocaine (LIDODERM) 5 % Place 1 patch onto the skin daily. Remove & Discard patch within 12 hours or as directed by MD 11/15/21  Yes Dahal, Marlowe Aschoff, MD  Magnesium Oxide 250 MG TABS Take 250 mg by mouth daily.   Yes [provider]  memantine (NAMENDA) 10 MG tablet Take 1 tablet (10 mg total) by mouth 2 (two) times daily. 06/13/21  Yes Angiulli, Lavon Paganini, PA-C  metoprolol succinate (TOPROL-XL) 25 MG 24 hr tablet Take 0.5 tablets (12.5 mg total) by mouth daily. 11/15/21  Yes Dahal, Marlowe Aschoff, MD  nitroGLYCERIN (NITROSTAT) 0.4 MG SL tablet Place 1 tablet (0.4 mg total) under the tongue every 5 (five) minutes x 3 doses as needed for chest pain. 12/16/19  Yes Cantwell, Celeste C, PA-C  nutrition supplement, JUVEN, (JUVEN) PACK Take 1 packet by mouth 2 (two) times daily between meals. 11/15/21  Yes Dahal, Marlowe Aschoff, MD  pantoprazole (PROTONIX) 40 MG tablet Take 40 mg by mouth daily.   Yes [provider]  polyethylene glycol (MIRALAX) 17 g packet Take 17 g by mouth daily.   Yes [provider]  potassium chloride (KLOR-CON M) 10 MEQ tablet Take 1 tablet by mouth everyday with furosemide Patient taking differently: Take 10 mEq by mouth daily. 06/13/21  Yes Angiulli, Lavon Paganini, PA-C  predniSONE (DELTASONE) 1 MG tablet Take 3 tablets (3 mg total) by mouth daily with breakfast. 06/13/21  Yes Angiulli, Lavon Paganini, PA-C  Psyllium Husk POWD Take 1 Scoop by mouth daily.    Yes [provider]  tamsulosin (FLOMAX) 0.4 MG CAPS capsule Take 1 capsule (0.4 mg total) by mouth daily after supper. Patient taking differently: Take 0.4 mg by mouth every evening. 06/13/21  Yes Angiulli, Lavon Paganini, PA-C  torsemide (DEMADEX) 20 MG tablet Take 2 tablets (40 mg total) by mouth daily. 06/13/21  Yes Angiulli, Lavon Paganini, PA-C  traMADol (ULTRAM) 50  MG tablet Take 0.5 tablets (25 mg total) by mouth every 6 (six) hours as needed for up to 5 days for moderate pain. 11/14/21 11/19/21 Yes Dahal, Marlowe Aschoff, MD  triamcinolone cream (KENALOG) 0.1 % Apply 1 Application topically as needed (rash/itching).   Yes [provider]  vitamin C (ASCORBIC ACID) 500 MG tablet Take 1 tablet (500 mg total) by mouth daily. 06/13/21  Yes Angiulli, Lavon Paganini, PA-C  Zinc 50 MG TABS Take 50 mg by mouth daily.   Yes [provider]  EPINEPHrine 0.3 mg/0.3 mL IJ SOAJ injection Inject 0.3 mg into the muscle as needed for anaphylaxis. Patient not taking: Reported on 11/19/2021    [provider]  guaiFENesin-dextromethorphan (ROBITUSSIN DM) 100-10 MG/5ML syrup Take 15 mLs by mouth every 4 (four) hours as needed for cough. Patient not taking: Reported on 11/19/2021 11/14/21   Terrilee Croak, MD  potassium chloride (MICRO-K) 10 MEQ CR capsule TAKE ONE CAPSULE BY MOUTH EVERY DAY WITH LASIX Patient taking differently: Take 10 mEq by mouth daily. 02/16/21 06/13/21  Alethia Berthold, PA-C     Critical care time: 81 minutes     Noe Gens, MSN, APRN, NP-C, AGACNP-BC [facility] 11/19/2021, 9:48 AM   Please see [email or web address] for pager details.   From 7A-7P if no response, please call [phone] After hours, please call ELink [phone]

## 2021-11-19 NOTE — Assessment & Plan Note (Signed)
AKI on CKD 3: Creat today of 4.x up from 1.x baseline Likely pre-renal / ATN in setting of severe sepsis IVF as above Hold nephrotoxic meds Renal US as above to r/o obstruction Repeat CMP in AM

## 2021-11-19 NOTE — Progress Notes (Addendum)
ANTICOAGULATION CONSULT NOTE - Follow Up Consult  Pharmacy Consult for Heparin Indication: atrial fibrillation  Allergies  Allergen Reactions   Amoxicillin Other (See Comments)    Headache, debilitated   Bee Venom Anaphylaxis   Avelox [Moxifloxacin] Other (See Comments)    Unknown   Codeine Other (See Comments)    Unknown    Duloxetine Other (See Comments)    Unknown   Duloxetine Hcl Other (See Comments)    felt weird   Levitra [Vardenafil] Other (See Comments)    Unknown   Morphine And Related Other (See Comments)    hypotension   Penicillin G Sodium Other (See Comments)    Severe headache, fatigue "debilitated"   Testosterone Other (See Comments)    Unknown    Tizanidine Other (See Comments)    Unknown     Viagra [Sildenafil] Other (See Comments)    didn't like it    Patient Measurements: Height: '6\' 1"'$  (185.4 cm) Weight: 93.4 kg (206 lb) IBW/kg (Calculated) : 79.9  Vital Signs: Temp: 96.2 F (35.7 C) (11/19 1512) Temp Source: Axillary (11/19 1512) BP: 106/55 (11/19 1230) Pulse Rate: 69 (11/19 1230)  Labs: Recent Labs    11/18/21 2029 11/18/21 2114 11/18/21 2122 11/19/21 0253 11/19/21 0433 11/19/21 0525 11/19/21 1516  HGB 7.8*  --  8.2*  --   --  7.0*  --   HCT 25.6*  --  24.0*  --   --  23.1*  --   PLT 171  --   --   --   --  110*  --   APTT  --  33  --   --   --   --  188*  LABPROT  --  19.5*  --   --  20.6*  --   --   INR  --  1.7*  --   --  1.8*  --   --   HEPARINUNFRC  --   --   --  >1.10*  --   --   --   CREATININE 4.25*  --   --   --  4.05*  --  3.70*     Estimated Creatinine Clearance: 14.1 mL/min (A) (by C-G formula based on SCr of 3.7 mg/dL (H)).   Medical History: Past Medical History:  Diagnosis Date   AAA (abdominal aortic aneurysm) (HCC)    Arthritis    Ascending aorta dilatation ([place]) 03/13/2012   Fusiform dilatation of the ascending thoracic aorta discovered during surgery, max diameter 4.2-4.3 cm   BPH (benign prostatic  hypertrophy)    Chronic systolic heart failure ([place]) 03/03/2012   Chronic venous insufficiency    GERD (gastroesophageal reflux disease)    Glaucoma    Gout    History of kidney stones    Hyperlipidemia    Hypertension    Obesity (BMI 30-39.9)    Paroxysmal atrial fibrillation (HCC) 02/29/2012   Recurrent paroxysmal, new-onset    PMR (polymyalgia rheumatica) (HCC)    Polymyalgia rheumatica ([place]) 02/06/2019   Pre-diabetes    S/P CABG x 3 03/06/2012   LIMA to LAD, SVG to OM, SVG to RCA, EVH via right thigh   S/P [place] operation for atrial fibrillation 03/06/2012   Complete bilateral lesions set using bipolar radiofrequency and cryothermy ablation with clipping of LA appendage   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   s/p TAVR with a 26 mm Edwards via the left subclavian by Drs Buena Irish and Bartle   Severe aortic stenosis 09/29/2019  Sleep apnea    USES CPAP NIGHTLY    Medications:  No current facility-administered medications on file prior to encounter.   Current Outpatient Medications on File Prior to Encounter  Medication Sig Dispense Refill   acetaminophen (TYLENOL) 325 MG tablet Take 2 tablets (650 mg total) by mouth every 4 (four) hours as needed for mild pain (or temp > 37.5 C (99.5 F)). (Patient taking differently: Take 650 mg by mouth every 4 (four) hours as needed for mild pain.)     albuterol (PROVENTIL) (2.5 MG/3ML) 0.083% nebulizer solution Take 3 mLs (2.5 mg total) by nebulization every 6 (six) hours as needed for wheezing or shortness of breath. 75 mL 12   alum & mag hydroxide-simeth (MAALOX/MYLANTA) 200-200-20 MG/5ML suspension Take 30 mLs by mouth every 2 (two) hours as needed for indigestion or heartburn.     amiodarone (PACERONE) 200 MG tablet Take 1 tablet (200 mg total) by mouth daily. 30 tablet 0   apixaban (ELIQUIS) 2.5 MG TABS tablet Take 2.5 mg by mouth 2 (two) times daily.     aspirin EC 81 MG tablet Take 1 tablet (81 mg total) by  mouth daily. Swallow whole. 30 tablet 12   atorvastatin (LIPITOR) 40 MG tablet Take 1 tablet (40 mg total) by mouth daily. 90 tablet 1   bisacodyl (DULCOLAX) 5 MG EC tablet Take 1 tablet (5 mg total) by mouth daily as needed for moderate constipation. 30 tablet 0   cholecalciferol (VITAMIN D3) 25 MCG (1000 UNIT) tablet Take 1 tablet (1,000 Units total) by mouth daily. 30 tablet 0   Cyanocobalamin 5000 MCG SUBL Place 5,000 mcg under the tongue daily.     cyclobenzaprine (FLEXERIL) 5 MG tablet Take 5 mg by mouth 3 (three) times daily as needed for muscle spasms.     denosumab (PROLIA) 60 MG/ML SOSY injection Inject 60 mg into the skin every 6 (six) months.     docusate sodium (COLACE) 100 MG capsule Take 1 capsule (100 mg total) by mouth 2 (two) times daily.     ezetimibe (ZETIA) 10 MG tablet Take 1 tablet (10 mg total) by mouth daily. 30 tablet 0   feeding supplement, GLUCERNA SHAKE, (GLUCERNA SHAKE) LIQD Take 237 mLs by mouth 3 (three) times daily between meals. (Patient taking differently: Take 237 mLs by mouth in the morning and at bedtime.)  0   finasteride (PROSCAR) 5 MG tablet Take 1 tablet (5 mg total) by mouth daily. 30 tablet 0   fish oil-omega-3 fatty acids 1000 MG capsule Take 1 capsule (1 g total) by mouth daily. 30 capsule 0   gabapentin (NEURONTIN) 100 MG capsule Take 2 capsules by mouth in the morning, 1 capsule in the evening, and 2 capsules at bedtime. (Patient taking differently: Take 100-200 mg by mouth See admin instructions. Take 200 mg by mouth in the morning and at bedtime. Take 100 mg by mouth in the evening at 1700) 90 capsule 0   galantamine (RAZADYNE ER) 8 MG 24 hr capsule Take 1 capsule (8 mg total) by mouth daily with breakfast. 30 capsule 0   isosorbide mononitrate (IMDUR) 60 MG 24 hr tablet Take 1 tablet (60 mg total) by mouth daily. 30 tablet 0   leflunomide (ARAVA) 20 MG tablet Take 1/2 tablets (10 mg total) by mouth daily. 30 tablet 0   lidocaine (LIDODERM) 5 % Place  1 patch onto the skin daily. Remove & Discard patch within 12 hours or as directed by MD 30 patch  0   Magnesium Oxide 250 MG TABS Take 250 mg by mouth daily.     memantine (NAMENDA) 10 MG tablet Take 1 tablet (10 mg total) by mouth 2 (two) times daily. 60 tablet 0   metoprolol succinate (TOPROL-XL) 25 MG 24 hr tablet Take 0.5 tablets (12.5 mg total) by mouth daily.     nitroGLYCERIN (NITROSTAT) 0.4 MG SL tablet Place 1 tablet (0.4 mg total) under the tongue every 5 (five) minutes x 3 doses as needed for chest pain. 30 tablet 3   nutrition supplement, JUVEN, (JUVEN) PACK Take 1 packet by mouth 2 (two) times daily between meals.  0   pantoprazole (PROTONIX) 40 MG tablet Take 40 mg by mouth daily.     polyethylene glycol (MIRALAX) 17 g packet Take 17 g by mouth daily.     potassium chloride (KLOR-CON M) 10 MEQ tablet Take 1 tablet by mouth everyday with furosemide (Patient taking differently: Take 10 mEq by mouth daily.) 30 tablet 0   predniSONE (DELTASONE) 1 MG tablet Take 3 tablets (3 mg total) by mouth daily with breakfast. 30 tablet 0   Psyllium Husk POWD Take 1 Scoop by mouth daily.      tamsulosin (FLOMAX) 0.4 MG CAPS capsule Take 1 capsule (0.4 mg total) by mouth daily after supper. (Patient taking differently: Take 0.4 mg by mouth every evening.) 30 capsule 0   torsemide (DEMADEX) 20 MG tablet Take 2 tablets (40 mg total) by mouth daily. 60 tablet 0   traMADol (ULTRAM) 50 MG tablet Take 0.5 tablets (25 mg total) by mouth every 6 (six) hours as needed for up to 5 days for moderate pain. 10 tablet 0   triamcinolone cream (KENALOG) 0.1 % Apply 1 Application topically as needed (rash/itching).     vitamin C (ASCORBIC ACID) 500 MG tablet Take 1 tablet (500 mg total) by mouth daily. 30 tablet 0   Zinc 50 MG TABS Take 50 mg by mouth daily.     [DISCONTINUED] cyclobenzaprine (FLEXERIL) 5 MG tablet Take 1 tablet (5 mg total) by mouth 3 (three) times daily as needed for muscle spasms. 30 tablet 0    EPINEPHrine 0.3 mg/0.3 mL IJ SOAJ injection Inject 0.3 mg into the muscle as needed for anaphylaxis. (Patient not taking: Reported on 11/19/2021)     guaiFENesin-dextromethorphan (ROBITUSSIN DM) 100-10 MG/5ML syrup Take 15 mLs by mouth every 4 (four) hours as needed for cough. (Patient not taking: Reported on 11/19/2021) 118 mL 0   [DISCONTINUED] alum & mag hydroxide-simeth (MAALOX/MYLANTA) 200-200-20 MG/5ML suspension Take 15-30 mLs by mouth every 2 (two) hours as needed for indigestion. 355 mL 0   [DISCONTINUED] Cyanocobalamin (VITAMIN B-12) 5000 MCG TBDP Take 5,000 mcg by mouth daily. 30 tablet 0   [DISCONTINUED] Magnesium 250 MG TABS Take 1 tablet (250 mg total) by mouth daily. 30 tablet 0   [DISCONTINUED] potassium chloride (MICRO-K) 10 MEQ CR capsule TAKE ONE CAPSULE BY MOUTH EVERY DAY WITH LASIX (Patient taking differently: Take 10 mEq by mouth daily.) 90 capsule 1   [DISCONTINUED] zinc sulfate 220 (50 Zn) MG capsule Take 1 capsule (220 mg total) by mouth daily.       Assessment: 88 y.o. male admitted with sepsis, h/o Afib and Eliquis on hold, for heparin.  Eliquis last taken 11/17 HL falsely elevated by apixaban will use aptt to dose heparin. Heparin drip 1250 uts/hr aptt elevated 188sec.    PM heparin on and off throughout shift - aptt redraw 61sec - will  keep heparin drip 1000 uts/hr and follow up in am  RN to move heparin drip to PIV and draw labs from midline  Goal of Therapy:  aPTT 66-102 seconds Heparin level 0.3-0.7 units/ml Monitor platelets by anticoagulation protocol: Yes   Plan:   heparin drip 1000 uts/hr Daily aptt, heparin level and cbc  Monitor s/s bleeding    Bonnita Nasuti Pharm.D. CPP, BCPS Clinical Pharmacist [phone] 11/19/2021 4:51 PM    Nadine Counts 11/19/2021,4:45 PM

## 2021-11-20 ENCOUNTER — Encounter: Payer: Medicare Other | Admitting: Orthopedic Surgery

## 2021-11-20 ENCOUNTER — Encounter (HOSPITAL_COMMUNITY): Payer: Self-pay | Admitting: Pulmonary Disease

## 2021-11-20 ENCOUNTER — Inpatient Hospital Stay (HOSPITAL_COMMUNITY): Payer: Medicare Other

## 2021-11-20 DIAGNOSIS — I38 Endocarditis, valve unspecified: Secondary | ICD-10-CM

## 2021-11-20 DIAGNOSIS — R6520 Severe sepsis without septic shock: Secondary | ICD-10-CM | POA: Diagnosis not present

## 2021-11-20 DIAGNOSIS — A419 Sepsis, unspecified organism: Secondary | ICD-10-CM | POA: Diagnosis not present

## 2021-11-20 LAB — ECHOCARDIOGRAM COMPLETE
AR max vel: 2.04 cm2
AV Area VTI: 1.62 cm2
AV Area mean vel: 1.9 cm2
AV Mean grad: 11 mmHg
AV Peak grad: 20.1 mmHg
Ao pk vel: 2.24 m/s
S' Lateral: 3.6 cm

## 2021-11-20 LAB — CBC
HCT: 24.3 % — ABNORMAL LOW (ref 39.0–52.0)
Hemoglobin: 7.3 g/dL — ABNORMAL LOW (ref 13.0–17.0)
MCH: 27.8 pg (ref 26.0–34.0)
MCHC: 30 g/dL (ref 30.0–36.0)
MCV: 92.4 fL (ref 80.0–100.0)
Platelets: 106 10*3/uL — ABNORMAL LOW (ref 150–400)
RBC: 2.63 MIL/uL — ABNORMAL LOW (ref 4.22–5.81)
RDW: 16.5 % — ABNORMAL HIGH (ref 11.5–15.5)
WBC: 17.9 10*3/uL — ABNORMAL HIGH (ref 4.0–10.5)
nRBC: 0.1 % (ref 0.0–0.2)

## 2021-11-20 LAB — CLOSTRIDIUM DIFFICILE BY PCR, REFLEXED: Toxigenic C. Difficile by PCR: NEGATIVE

## 2021-11-20 LAB — HEPARIN LEVEL (UNFRACTIONATED)
Heparin Unfractionated: >1.1 IU/mL — ABNORMAL HIGH (ref 0.30–0.70)
Heparin Unfractionated: >1.1 IU/mL — ABNORMAL HIGH (ref 0.30–0.70)

## 2021-11-20 LAB — APTT
aPTT: 56 seconds — ABNORMAL HIGH (ref 24–36)
aPTT: 61 seconds — ABNORMAL HIGH (ref 24–36)

## 2021-11-20 LAB — C DIFFICILE QUICK SCREEN W PCR REFLEX
C Diff antigen: POSITIVE — AB
C Diff toxin: NEGATIVE

## 2021-11-20 MED ORDER — ALBUMIN HUMAN 25 % IV SOLN
25.0000 g | Freq: Four times a day (QID) | INTRAVENOUS | Status: AC
  Administered 2021-11-20 (×2): 25 g via INTRAVENOUS
  Filled 2021-11-20 (×2): qty 100

## 2021-11-20 MED ORDER — ACETAMINOPHEN 650 MG RE SUPP
325.0000 mg | Freq: Once | RECTAL | Status: AC
  Administered 2021-11-20: 325 mg via RECTAL
  Filled 2021-11-20: qty 1

## 2021-11-20 MED ORDER — CEFAZOLIN SODIUM-DEXTROSE 2-4 GM/100ML-% IV SOLN
2.0000 g | Freq: Two times a day (BID) | INTRAVENOUS | Status: DC
  Administered 2021-11-20: 2 g via INTRAVENOUS
  Filled 2021-11-20 (×2): qty 100

## 2021-11-20 MED ORDER — FENTANYL CITRATE PF 50 MCG/ML IJ SOSY
25.0000 ug | PREFILLED_SYRINGE | INTRAMUSCULAR | Status: DC | PRN
  Administered 2021-11-20 – 2021-11-24 (×15): 25 ug via INTRAVENOUS
  Filled 2021-11-20 (×15): qty 1

## 2021-11-20 MED ORDER — LACTATED RINGERS IV BOLUS
1000.0000 mL | Freq: Once | INTRAVENOUS | Status: AC
  Administered 2021-11-20: 1000 mL via INTRAVENOUS

## 2021-11-20 MED ORDER — PIPERACILLIN-TAZOBACTAM IN DEX 2-0.25 GM/50ML IV SOLN
2.2500 g | Freq: Four times a day (QID) | INTRAVENOUS | Status: DC
  Administered 2021-11-20 – 2021-11-22 (×6): 2.25 g via INTRAVENOUS
  Filled 2021-11-20 (×8): qty 50

## 2021-11-20 NOTE — Progress Notes (Signed)
[facility] for Infectious Disease    Date of Admission:  11/18/2021   Total days of antibiotics 3           ID: [name] [name] is a 88 y.o. male with fever and AMS found to have MSSA bacteremia. Recently had 2nd ray amputation on 11/8 which appeals to be healing. Also concern for septic thrombophlebitis.Ua and urine cx + patient is poor historian Principal Problem:   Severe sepsis ([place]) Active Problems:   CAD (coronary artery disease), native coronary artery   Essential hypertension   Paroxysmal atrial fibrillation (HCC)   OSA (obstructive sleep apnea)   Chronic diastolic CHF (congestive heart failure) (HCC)   CKD (chronic kidney disease) stage 3, GFR 30-59 ml/min (HCC)   AKI (acute kidney injury) ([employer])   Dementia (HCC)   Polymyalgia rheumatica (HCC)   Skin ulcers of foot, bilateral (HCC)   History of CVA with residual deficit   Acute metabolic encephalopathy   Sepsis secondary to UTI ([place])   Septic shock ([place])    Subjective: Still altered but occasionally answers question correctly. Complains of diffuse pain. He is afebrile but still on low dose pressors through left mid line   Medications:   Chlorhexidine Gluconate Cloth  6 each Topical Daily   pantoprazole (PROTONIX) IV  40 mg Intravenous Daily   sodium chloride flush  10-40 mL Intracatheter Q12H    Objective: Vital signs in last 24 hours: Temp:  [97.6 F (36.4 C)-98.5 F (36.9 C)] 97.6 F (36.4 C) (11/20 1519) Pulse Rate:  [63-89] 75 (11/20 1700) Resp:  [14-27] 26 (11/20 1700) BP: (83-138)/(45-121) 102/64 (11/20 1700) SpO2:  [90 %-100 %] 98 % (11/20 1700) Weight:  [96.5 kg-98.5 kg] 98.5 kg (11/20 0400)  Physical Exam  Constitutional: He is oriented to person, place, and time. He appears well-developed and well-nourished. No distress.  HENT:  Mouth/Throat: Oropharynx is clear and moist. No oropharyngeal exudate.  Cardiovascular: Normal rate, regular rhythm and normal heart sounds. Exam reveals no gallop  and no friction rub.  No murmur heard.  Pulmonary/Chest: Effort normal and breath sounds normal. No respiratory distress. He has no wheezes.  Abdominal: Soft. Bowel sounds are normal. He exhibits no distension. There is no tenderness.  Ext: left foot healing incision no drainage. Shallow Pressure ulcers to heals bilaterally.blister on left foot Neurological: He is alert and oriented to person, only Skin: Skin is warm and dry. No rash noted. No erythema.  Psychiatric: He has a normal mood and affect. His behavior is normal.    Lab Results Recent Labs    11/19/21 0433 11/19/21 0525 11/19/21 1516 11/20/21 0410  WBC  --  18.1*  --  17.9*  HGB  --  7.0*  --  7.3*  HCT  --  23.1*  --  24.3*  NA 135  --  134*  --   K 4.3  --  3.8  --   CL 98  --  103  --   CO2 17*  --  19*  --   BUN 83*  --  81*  --   CREATININE 4.05*  --  3.70*  --    Liver Panel Recent Labs    11/19/21 0433 11/19/21 1516  PROT 5.6* 5.8*  ALBUMIN 1.6* 1.6*  AST 90* 84*  ALT 44 40  ALKPHOS 207* 209*  BILITOT 1.0 0.8   Sedimentation Rate No results for input(s): "ESRSEDRATE" in the last 72 hours. C-Reactive Protein No results for  input(s): "CRP" in the last 72 hours.  Microbiology: Blood cx 1/18 mssa Blood cx 1/20 NGTD Studies/Results: ECHOCARDIOGRAM COMPLETE  Result Date: 11/20/2021    ECHOCARDIOGRAM REPORT   Patient Name:   [name] [name] Date of Exam: 11/20/2021 Medical Rec #:  [id number]     Height:       73.0 in Accession #:    [id number]    Weight:       217.2 lb Date of Birth:  Jul 01, 1926      BSA:          2.228 m Patient Age:    78 years      BP:           89/65 mmHg Patient Gender: M             HR:           65 bpm. Exam Location:  Inpatient Procedure: 2D Echo, Cardiac Doppler and Color Doppler Indications:    Endocarditis  History:        Patient has prior history of Echocardiogram examinations, most                 recent 05/26/2021. CAD, Prior CABG; Risk Factors:Hypertension and                  Sleep Apnea. S/P Maze. Aortic Valve: 26 mm Sapien prosthetic,                 stented (TAVR) valve is present in the                 aortic position. Procedure Date: 11/10/19.  Sonographer:    Clayton Lefort RDCS (AE) Referring Phys: [facility]  Sonographer Comments: Technically difficult study due to poor echo windows. IMPRESSIONS  1. Left ventricular ejection fraction, by estimation, is 55 to 60%. The left ventricle has normal function. The left ventricle has no regional wall motion abnormalities. There is moderate concentric left ventricular hypertrophy. Left ventricular diastolic parameters are indeterminate.  2. Right ventricular systolic function is normal. The right ventricular size is normal. There is normal pulmonary artery systolic pressure.  3. Left atrial size was moderately dilated.  4. The mitral valve is normal in structure. Trivial mitral valve regurgitation. No evidence of mitral stenosis.  5. Tricuspid valve regurgitation is moderate.  6. TAVR gradient essentially unchanged from 05/2021. Mild perivalvular regurgitation. The aortic valve has been repaired/replaced. Aortic valve regurgitation is mild. No aortic stenosis is present. Echo findings are consistent with normal structure and function of the aortic valve prosthesis. Aortic valve area, by VTI measures 1.62 cm. Aortic valve mean gradient measures 11.0 mmHg. Aortic valve Vmax measures 2.24 m/s.  7. Aortic dilatation noted. There is mild dilatation of the aortic root, measuring 40 mm.  8. The inferior vena cava is dilated in size with <50% respiratory variability, suggesting right atrial pressure of 15 mmHg. FINDINGS  Left Ventricle: Left ventricular ejection fraction, by estimation, is 55 to 60%. The left ventricle has normal function. The left ventricle has no regional wall motion abnormalities. The left ventricular internal cavity size was normal in size. There is  moderate concentric left ventricular hypertrophy. Left ventricular diastolic  parameters are indeterminate. Right Ventricle: The right ventricular size is normal. No increase in right ventricular wall thickness. Right ventricular systolic function is normal. There is normal pulmonary artery systolic pressure. The tricuspid regurgitant velocity is 2.03 m/s, and  with an assumed right atrial pressure of 15 mmHg,  the estimated right ventricular systolic pressure is 63.8 mmHg. Left Atrium: Left atrial size was moderately dilated. Right Atrium: Right atrial size was normal in size. Pericardium: There is no evidence of pericardial effusion. Mitral Valve: The mitral valve is normal in structure. There is mild thickening of the mitral valve leaflet(s). Mild mitral annular calcification. Trivial mitral valve regurgitation. No evidence of mitral valve stenosis. Tricuspid Valve: The tricuspid valve is normal in structure. Tricuspid valve regurgitation is moderate . No evidence of tricuspid stenosis. Aortic Valve: TAVR gradient essentially unchanged from 05/2021. Mild perivalvular regurgitation. The aortic valve has been repaired/replaced. Aortic valve regurgitation is mild. No aortic stenosis is present. Aortic valve mean gradient measures 11.0 mmHg.  Aortic valve peak gradient measures 20.1 mmHg. Aortic valve area, by VTI measures 1.62 cm. There is a 26 mm Sapien prosthetic, stented (TAVR) valve present in the aortic position. Echo findings are consistent with normal structure and function of the aortic valve prosthesis. Pulmonic Valve: The pulmonic valve was normal in structure. Pulmonic valve regurgitation is not visualized. No evidence of pulmonic stenosis. Aorta: Aortic dilatation noted. There is mild dilatation of the aortic root, measuring 40 mm. Venous: The inferior vena cava is dilated in size with less than 50% respiratory variability, suggesting right atrial pressure of 15 mmHg. IAS/Shunts: No atrial level shunt detected by color flow Doppler.  LEFT VENTRICLE PLAX 2D LVIDd:         5.30 cm  LVIDs:         3.60 cm LV PW:         1.30 cm LV IVS:        1.50 cm LVOT diam:     2.60 cm LV SV:         84 LV SV Index:   38 LVOT Area:     5.31 cm  RIGHT VENTRICLE            IVC RV Basal diam:  3.40 cm    IVC diam: 2.30 cm RV S prime:     8.59 cm/s TAPSE (M-mode): 1.0 cm LEFT ATRIUM             Index        RIGHT ATRIUM           Index LA diam:        3.70 cm 1.66 cm/m   RA Area:     21.00 cm LA Vol (A2C):   81.4 ml 36.53 ml/m  RA Volume:   60.10 ml  26.97 ml/m LA Vol (A4C):   84.3 ml 37.83 ml/m LA Biplane Vol: 85.0 ml 38.15 ml/m  AORTIC VALVE AV Area (Vmax):    2.04 cm AV Area (Vmean):   1.90 cm AV Area (VTI):     1.62 cm AV Vmax:           224.00 cm/s AV Vmean:          158.000 cm/s AV VTI:            0.519 m AV Peak Grad:      20.1 mmHg AV Mean Grad:      11.0 mmHg LVOT Vmax:         85.90 cm/s LVOT Vmean:        56.600 cm/s LVOT VTI:          0.158 m LVOT/AV VTI ratio: 0.30  AORTA Ao Root diam: 4.00 cm TRICUSPID VALVE TR Peak grad:   16.5 mmHg TR Vmax:  203.00 cm/s  SHUNTS Systemic VTI:  0.16 m Systemic Diam: 2.60 cm Skeet Latch MD Electronically signed by Skeet Latch MD Signature Date/Time: 11/20/2021/4:10:47 PM    Final    DG Foot 2 Views Right  Result Date: 11/19/2021 CLINICAL DATA:  [id number] severe sepsis and posterolateral right foot ulcer. EXAM: RIGHT FOOT - 2 VIEW COMPARISON:  The study of 11/02/2021 FINDINGS: There is mild generalized edema, greater posteriorly, slightly improved since November 2. There is no visible soft tissue gas. Vascular calcifications are present in the posterior plantar foot. There is normal bone mineralization. No evidence of fractures or destructive bone lesions. There is a sizable plantar calcaneal enthesophyte. There are mild features of degenerative arthrosis of the toe joints and first MTP joint, slight spurring of the tarsometatarsal joints. Other joints are maintained. There is no erosive arthropathy. No foreign body is visible.  IMPRESSION: 1. Mild generalized edema, slightly improved since November 2. No visible soft tissue gas or foreign body. 2. No evidence of fractures or destructive bone lesions. 3. Degenerative changes and vascular calcifications. Electronically Signed   By: Telford Nab M.D.   On: 11/19/2021 01:34   US RENAL  Result Date: 11/19/2021 CLINICAL DATA:  [id number] with sepsis secondary to UTI. EXAM: RENAL / URINARY TRACT ULTRASOUND COMPLETE COMPARISON:  Renal ultrasound 10/22/2020, CT without contrast 03/14/2021. FINDINGS: Right Kidney: Renal measurements: 11.9 x 6.6 x 6.6 cm = volume: 273.6 mL. Again noted is mild renal cortical thinning and slight increased renal cortical echogenicity consistent with medical renal disease. No mass, stones or hydronephrosis visualized. A simple cyst is again noted in the superior pole measuring 2.7 cm. Left Kidney: Renal measurements: 11.9 x 6.5 x 6.2 cm = volume: 250.8 mL. Again noted is mild renal cortical thinning and slight increased renal cortical echogenicity consistent with medical renal disease. No mass, stones or hydronephrosis visualized. The left kidney is less well seen than the right due to habitus and deep position. Bladder: The bladder has developed mild general thickening. There is layering debris within the bladder warranting correlation with urinalysis. Along the right and left lateral walls there is trabeculation of the bladder surface which was not seen on 03/14/2021 CT. This can be seen with bladder hypertrophy but should be correlated with other imaging or direct visualization to exclude neoplasm. Other: None. IMPRESSION: 1. The bladder has developed mild general thickening. There is layering debris within the bladder. Along the right and left lateral walls there is trabeculation of the bladder surface which was not seen on 03/14/2021 CT. This can be seen with bladder hypertrophy but should be correlated with other imaging or direct visualization to exclude  neoplasm. 2. Again noted is mild renal cortical thinning and slight increased renal cortical echogenicity consistent with medical renal disease. No progression in this is seen since the renal ultrasound of 10/22/2020. 3. 2.7 cm simple cyst upper pole right kidney. Electronically Signed   By: Telford Nab M.D.   On: 11/19/2021 01:30   DG Foot Complete Left  Result Date: 11/18/2021 CLINICAL DATA:  Questionable sepsis - evaluate for abnormality. Hypoxia, recent left second toe amputation with sepsis now. Rule out new osteomyelitis. EXAM: LEFT FOOT - COMPLETE 3+ VIEW COMPARISON:  11/02/2021 FINDINGS: Prior transmetatarsal amputation of the left 2nd toe. No definite changes of acute osteomyelitis at the amputation site or elsewhere within the left foot. No fracture, subluxation or dislocation. No soft tissue gas. IMPRESSION: Prior left 2nd toe amputation.  No visible acute bony abnormality. Electronically Signed  By: Rolm Baptise M.D.   On: 11/18/2021 20:14   DG Chest Port 1 View  Result Date: 11/18/2021 CLINICAL DATA:  Questionable sepsis EXAM: PORTABLE CHEST 1 VIEW COMPARISON:  Chest x-ray 11/02/2021 FINDINGS: Patient is status post TAVR. Sternotomy wires are again noted. The aorta is ectatic. The heart is enlarged, unchanged. There is no focal lung infiltrate, pleural effusion or pneumothorax. Surgical clips are seen in the left axilla. No acute fractures. IMPRESSION: 1. No active disease. 2. Cardiomegaly. 3. Status post TAVR. Electronically Signed   By: Ronney Asters M.D.   On: 11/18/2021 20:12   CT HEAD WO CONTRAST (5MM)  Result Date: 11/18/2021 CLINICAL DATA:  Mental status change. EXAM: CT HEAD WITHOUT CONTRAST TECHNIQUE: Contiguous axial images were obtained from the base of the skull through the vertex without intravenous contrast. RADIATION DOSE REDUCTION: This exam was performed according to the departmental dose-optimization program which includes automated exposure control, adjustment of  the mA and/or kV according to patient size and/or use of iterative reconstruction technique. COMPARISON:  MRI brain 05/25/2021. CT angiogram head and neck 05/25/2021. FINDINGS: Brain: No evidence of acute infarction, hemorrhage, hydrocephalus, extra-axial collection or mass lesion/mass effect. There are areas of chronic cortical infarct in the left frontal lobe left operculum corresponding to acute infarcts seen on 05/25/2021. There is stable mild periventricular white matter hypodensity, likely chronic small vessel ischemic change. Small old cerebellar infarcts are present similar to prior. Vascular: Atherosclerotic calcifications are present within the cavernous internal carotid arteries. Skull: Normal. Negative for fracture or focal lesion. Sinuses/Orbits: No acute finding. Other: None. IMPRESSION: 1. No acute intracranial process. If there is high clinical concern for small acute infarct, recommend further evaluation with MRI. 2. Chronic infarcts in the left frontal lobe and left operculum. 3. Stable mild chronic small vessel ischemic change of the white matter. Electronically Signed   By: Ronney Asters M.D.   On: 11/18/2021 19:46     Assessment/Plan: MSSA bacteremia of unclear source = continue on piptazo to cover mssa bacteremia plus PsA uti. Recommend to get TEE when sensorium improves  PsA/ecoli uti = should be able to be covered by piptazo for few additional days  Pressure ulcers = recommend off loading padded boots to minimize pressure ulcers worsening  [facility] for Infectious Diseases Pager: [phone]  11/20/2021, 5:22 PM

## 2021-11-20 NOTE — Consult Note (Signed)
[facility] Nurse Consult Note: Reason for Consult: multiple Met with patient and bedside nurse in the patient's room Wound type: trauma right pretibial; partial thickness; healing; 100% pink Deep Tissue Pressure Injury: left heel; intact  Stage 2 Pressure Injury: right ischium; partial thickness; 100% pink Pressure Injury POA: Yes Measurement: see nursing flow sheets Wound bed: see above  Drainage (amount, consistency, odor) none Periwound: intact  Dressing procedure/placement/frequency: Silicone foam dressings to the right ischium, left heel, and right pretibial wounds, change every 3 days. Assess under dressings each shift for any acute changes in the wounds.  No family in the patients room Discussed POC with bedside nurse.  Re consult if needed, will not follow at this time. Thanks  Christos Mixson [employer], RN,CWOCN, CNS, [place] [phone])

## 2021-11-20 NOTE — Progress Notes (Signed)
  Echocardiogram 2D Echocardiogram has been performed.  [name] [name] 11/20/2021, 2:59 PM

## 2021-11-20 NOTE — Progress Notes (Signed)
ANTICOAGULATION CONSULT NOTE - Follow Up Consult  Pharmacy Consult for Heparin Indication: Atrial Fibrillation  Allergies  Allergen Reactions   Amoxicillin Other (See Comments)    Headache, debilitated   Bee Venom Anaphylaxis   Avelox [Moxifloxacin] Other (See Comments)    Unknown   Codeine Other (See Comments)    Unknown    Duloxetine Other (See Comments)    Unknown   Duloxetine Hcl Other (See Comments)    felt weird   Levitra [Vardenafil] Other (See Comments)    Unknown   Morphine And Related Other (See Comments)    hypotension   Penicillin G Sodium Other (See Comments)    Severe headache, fatigue "debilitated"   Testosterone Other (See Comments)    Unknown    Tizanidine Other (See Comments)    Unknown     Viagra [Sildenafil] Other (See Comments)    didn't like it    Patient Measurements: Height: '6\' 1"'$  (185.4 cm) Weight: 98.5 kg (217 lb 2.5 oz) IBW/kg (Calculated) : 79.9 Heparin Dosing Weight: 93kg  Vital Signs: Temp: 97.6 F (36.4 C) (11/20 0722) Temp Source: Oral (11/20 0722) BP: 121/69 (11/20 0600) Pulse Rate: 79 (11/20 0600)  Labs: Recent Labs    11/18/21 2029 11/18/21 2029 11/18/21 2114 11/18/21 2122 11/19/21 0253 11/19/21 0433 11/19/21 0525 11/19/21 1516 11/19/21 2130 11/20/21 0410  HGB 7.8*  --   --  8.2*  --   --  7.0*  --   --  7.3*  HCT 25.6*  --   --  24.0*  --   --  23.1*  --   --  24.3*  PLT 171  --   --   --   --   --  110*  --   --  106*  APTT  --    < > 33  --   --   --   --  188* 61* 56*  LABPROT  --   --  19.5*  --   --  20.6*  --   --   --   --   INR  --   --  1.7*  --   --  1.8*  --   --   --   --   HEPARINUNFRC  --   --   --   --  >1.10*  --   --   --  >1.10* >1.10*  CREATININE 4.25*  --   --   --   --  4.05*  --  3.70*  --   --    < > = values in this interval not displayed.    Estimated Creatinine Clearance: 15.4 mL/min (A) (by C-G formula based on SCr of 3.7 mg/dL (H)).   Medications:  Scheduled:   Chlorhexidine  Gluconate Cloth  6 each Topical Daily   pantoprazole (PROTONIX) IV  40 mg Intravenous Daily   sodium chloride flush  10-40 mL Intracatheter Q12H   Infusions:   sodium chloride 150 mL/hr at 11/20/21 0600   sodium chloride Stopped (11/19/21 0808)   acetaminophen Stopped (11/19/21 2306)   ceFEPime (MAXIPIME) IV     heparin 1,000 Units/hr (11/20/21 0600)   norepinephrine (LEVOPHED) Adult infusion 4 mcg/min (11/20/21 0600)    Assessment: 61 YOM admitting with sepsis, on Eliquis PTA for atrial fibrillation. Last dose taken on 11/17. Pharmacy consulted to dose heparin.  aPTT 56 on heparin 1000 units/hr. Heparin level remains >1.1 given recent Eliquis use. CBC stable with no signs of bleeding.  Continue to monitor aPTT and heparin level until correlating.  Goal of Therapy:  Heparin level 0.3-0.7 units/ml aPTT 66-102 seconds Monitor platelets by anticoagulation protocol: Yes   Plan:  Increase heparin to 1100 units/hr Check 8hr aPTT/heparin level at 1600 Daily CBC and heparin level/aPTT until correlating Monitor for signs/symptoms of bleeding.  Merrilee Jansky, PharmD Clinical Pharmacist 11/20/2021,8:06 AM

## 2021-11-20 NOTE — Progress Notes (Signed)
ANTICOAGULATION CONSULT NOTE - Follow Up Consult  Pharmacy Consult for Heparin Indication: Atrial Fibrillation  Allergies  Allergen Reactions   Amoxicillin Other (See Comments)    Headache, debilitated   Bee Venom Anaphylaxis   Avelox [Moxifloxacin] Other (See Comments)    Unknown   Codeine Other (See Comments)    Unknown    Duloxetine Other (See Comments)    Unknown   Duloxetine Hcl Other (See Comments)    felt weird   Levitra [Vardenafil] Other (See Comments)    Unknown   Morphine And Related Other (See Comments)    hypotension   Penicillin G Sodium Other (See Comments)    Severe headache, fatigue "debilitated"   Testosterone Other (See Comments)    Unknown    Tizanidine Other (See Comments)    Unknown     Viagra [Sildenafil] Other (See Comments)    didn't like it    Patient Measurements: Height: '6\' 1"'$  (185.4 cm) Weight: 98.5 kg (217 lb 2.5 oz) IBW/kg (Calculated) : 79.9 Heparin Dosing Weight: 93kg  Vital Signs: Temp: 97.6 F (36.4 C) (11/20 1519) Temp Source: Oral (11/20 1519) BP: 104/63 (11/20 1745) Pulse Rate: 75 (11/20 1745)  Labs: Recent Labs    11/18/21 2029 11/18/21 2029 11/18/21 2114 11/18/21 2122 11/19/21 0253 11/19/21 0433 11/19/21 0525 11/19/21 1516 11/19/21 2130 11/20/21 0410 11/20/21 1618  HGB 7.8*  --   --  8.2*  --   --  7.0*  --   --  7.3*  --   HCT 25.6*  --   --  24.0*  --   --  23.1*  --   --  24.3*  --   PLT 171  --   --   --   --   --  110*  --   --  106*  --   APTT  --    < > 33  --   --   --   --  188* 61* 56* 61*  LABPROT  --   --  19.5*  --   --  20.6*  --   --   --   --   --   INR  --   --  1.7*  --   --  1.8*  --   --   --   --   --   HEPARINUNFRC  --   --   --   --    < >  --   --   --  >1.10* >1.10* >1.10*  CREATININE 4.25*  --   --   --   --  4.05*  --  3.70*  --   --   --    < > = values in this interval not displayed.     Estimated Creatinine Clearance: 15.4 mL/min (A) (by C-G formula based on SCr of 3.7 mg/dL  (H)).   Medications:  Scheduled:   Chlorhexidine Gluconate Cloth  6 each Topical Daily   pantoprazole (PROTONIX) IV  40 mg Intravenous Daily   sodium chloride flush  10-40 mL Intracatheter Q12H   Infusions:   sodium chloride 150 mL/hr at 11/20/21 1600   sodium chloride Stopped (11/19/21 0808)   acetaminophen Stopped (11/19/21 2306)   albumin human 60 mL/hr at 11/20/21 1600   heparin 1,100 Units/hr (11/20/21 1600)   norepinephrine (LEVOPHED) Adult infusion Stopped (11/20/21 1141)   piperacillin-tazobactam (ZOSYN)  IV      Assessment: 44 YOM admitting with sepsis, on Eliquis PTA for  atrial fibrillation. Last dose taken on 11/17. Pharmacy consulted to dose heparin.  aPTT 61 on heparin 1100 units/hr. Heparin level remains >1.1 given recent Eliquis use. CBC stable with no signs of bleeding.   Continue to monitor aPTT and heparin level until correlating.  Goal of Therapy:  Heparin level 0.3-0.7 units/ml aPTT 66-102 seconds Monitor platelets by anticoagulation protocol: Yes   Plan:  Increase heparin to 1200 units/hr Check 8hr aPTT Daily CBC and heparin level/aPTT until correlating Monitor for signs/symptoms of bleeding.  Corinda Gubler, PharmD Clinical Pharmacist 11/20/2021,5:58 PM

## 2021-11-20 NOTE — Progress Notes (Signed)
NAME:  [name] [name], MRN:  [id number], DOB:  08-21-1926, LOS: 1 ADMISSION DATE:  11/18/2021, CONSULTATION DATE:  11/19 REFERRING MD:  Dr. Louanne Belton, CHIEF COMPLAINT: AMS, fever    History of Present Illness:  88 y/o M who presented to [facility] ER on 11/18 with reports of AMS & fever from SNF.   Recent admission from 11/2-11/4 for peripheral vascular disease s/p femoral-popliteal angioplasty and stenting on 11/6 and bilateral foot wounds with s/p left second ray amputation per Dr. Sharol Given on 11/8.    Presented to ER with reports of altered mental status and fever from SNF.  Wife reports they have been concerned over his urine for the past several days and has been taking it to the office out of concern.  Rectal temperature on admission noted to be 104.9.  Family noted him to be slightly confused in the early part of the week prior to admission.  This progressed to lethargy and then somnolence.  EMS was activated 11/18 and found him to be hypoxic requiring 3 L O2.  Patient is known DNR/DNI with paperwork accompanying him to the hospital.  CXR essentially clear on presentation.  X-ray of left foot without bony abnormality.  UA concerning for UTI with turbid appearance, large leukocytes, 100 protein, many bacteria, greater than 50 WBC.  Initial labs-NA 133, K4.5, CL 95, CO2 21, glucose 140, BUN 86, creatinine 4.25, alk phos 278, albumin 2, AST 79, ALT 45, WBC 17.5, Hgb 17.8, platelets 171.  Lactic acid 3.1.  Influenza and COVID screening negative.  Blood cultures with GPC's in clusters on Gram stain.  In the emergency room he was treated with 3 L LR bolus followed by infusion at 150/h.  He was pancultured and empirically treated with cefepime, Flagyl and vancomycin.  Despite IVF, patient's blood pressure persisted with 46O systolic and was started on Levophed peripherally per TRH.  PCCM consulted for septic shock.  Pertinent  Medical History  CAD s/p CABG S/p maze for AF Severe aortic stenosis s/p  TAVR AAA Chronic systolic heart failure HTN HLD Former Smoker  Obesity PMR PVD s/p femoral-popliteal angioplasty and stenting 11/06/2021 OSA-on CPAP Gout Prediabetes BPH  Significant Hospital Events: Including procedures, antibiotic start and stop dates in addition to other pertinent events   11/18 Admit with concern for septic shock in setting of UTI 11/19 on pressors, encephaopathinc, renal failure  Interim History / Subjective:  Family at bedside  Off pressors Moaning, encephalopathic Wounds look OK  Objective   Blood pressure (!) 89/65, pulse 70, temperature 97.6 F (36.4 C), temperature source Oral, resp. rate 14, height _0  (1.854 m), weight 98.5 kg, SpO2 90 %.        Intake/Output Summary (Last 24 hours) at 11/20/2021 1228 Last data filed at 11/20/2021 0800 Gross per 24 hour  Intake 2976.81 ml  Output 1075 ml  Net 1901.81 ml    Filed Weights   11/18/21 1914 11/19/21 2000 11/20/21 0400  Weight: 93.4 kg 96.5 kg 98.5 kg    Examination: General: elderly male lying in bed, moaning HENT: MM pink/dry, anicteric, cheeks flushed, dry MM Lungs: non-labored at rest, lungs bilaterally clear with good air entry  Cardiovascular: s1s2 RRR, no m/r/g Abdomen: non-distended, bsx4 active  Extremities: warm/dry, BLE with changes c/w chronic venous stasis, circular scab on LLE, LLE incision CDI, sutures in place, pressure wounds on bilateral heels POA Neuro: moans, when name called attends and attempts to answer questions   Resolved Hospital Problem list  Assessment & Plan:   Septic Shock secondary to Staph Bacteremia, Psa UTI  MSSA detected on blood cultures / BCID.  >100k CFU Psa on urine culture.  -MAP > 65, NE as needed, weaned off AM 11/20 -follow cultures to completion  -Appreciate ID assistance with abx  AKI secondary to Sepsis/Shock, ATN, hypovolemia -Trend BMP / urinary output -Cr improving -Avoid nephrotoxic agents, ensure adequate renal  perfusion -1L LR bolus, albumin to aid coming off pressors and as appears dry on exam  Recent Fem-Pop Stent Placement  PVD -hold home eliquis, imdur -continue heparin per pharmacy for now  CAD, s/p CABG, AS s/p TAVR, MAZE  -hold home agents given hypotension  -levophed as above  -hold home amiodarone, lipitor, toprolol, demadex   Baseline Dementia with Acute Metabolic Encephalopathy:  -supportive care -hold home namenda   PMR  -on 17m Predinsone QD -add stress dose steroids if remains hypotensive  BPH -hold flomax   Best Practice (right click and "Reselect all SmartList Selections" daily)  Diet/type: NPO DVT prophylaxis: systemic heparin GI prophylaxis: PPI Lines: N/A Foley:  N/A Code Status:  DNR Last date of multidisciplinary goals of care discussion: goals of care on 11/19 per Dr. IValeta Harms NP at bedside > family understand gravity of multisystem organ failure, staph infection and indicate they would not want further aggressive measures.  Discussed possibility that he may not survive this admission.  DNR/DNI. No central access. Continue peripheral vasopressors.   Labs   CBC: Recent Labs  Lab 11/14/21 0120 11/18/21 2029 11/18/21 2122 11/19/21 0525 11/20/21 0410  WBC 6.2 17.5*  --  18.1* 17.9*  NEUTROABS 4.4 16.3*  --   --   --   HGB 7.7* 7.8* 8.2* 7.0* 7.3*  HCT 24.2* 25.6* 24.0* 23.1* 24.3*  MCV 89.3 90.8  --  92.0 92.4  PLT 169 171  --  110* 106*     Basic Metabolic Panel: Recent Labs  Lab 11/14/21 0120 11/18/21 2029 11/18/21 2122 11/19/21 0433 11/19/21 1516  NA 137 133* 132* 135 134*  K 3.9 4.5 4.0 4.3 3.8  CL 102 95*  --  98 103  CO2 23 21*  --  17* 19*  GLUCOSE 125* 140*  --  137* 144*  BUN 42* 86*  --  83* 81*  CREATININE 1.52* 4.25*  --  4.05* 3.70*  CALCIUM 9.5 8.8*  --  8.4* 7.9*    GFR: Estimated Creatinine Clearance: 15.4 mL/min (A) (by C-G formula based on SCr of 3.7 mg/dL (H)). Recent Labs  Lab 11/14/21 0120 11/18/21 2029  11/18/21 2326 11/19/21 0433 11/19/21 0525 11/19/21 1516 11/20/21 0410  PROCALCITON  --   --   --  112.32  --   --   --   WBC 6.2 17.5*  --   --  18.1*  --  17.9*  LATICACIDVEN  --  3.1* 4.0* 3.4*  --  1.5  --      Liver Function Tests: Recent Labs  Lab 11/18/21 2029 11/19/21 0433 11/19/21 1516  AST 79* 90* 84*  ALT 45* 44 40  ALKPHOS 278* 207* 209*  BILITOT 1.2 1.0 0.8  PROT 6.5 5.6* 5.8*  ALBUMIN 2.0* 1.6* 1.6*    No results for input(s): "LIPASE", "AMYLASE" in the last 168 hours. No results for input(s): "AMMONIA" in the last 168 hours.  ABG    Component Value Date/Time   PHART 7.422 11/06/2019 0946   PCO2ART 36.5 11/06/2019 0946   PO2ART 105 11/06/2019 0946  HCO3 21.3 11/18/2021 2122   TCO2 22 11/18/2021 2122   ACIDBASEDEF 4.0 (H) 11/18/2021 2122   O2SAT 75 11/18/2021 2122     Coagulation Profile: Recent Labs  Lab 11/18/21 2114 11/19/21 0433  INR 1.7* 1.8*     Cardiac Enzymes: No results for input(s): "CKTOTAL", "CKMB", "CKMBINDEX", "TROPONINI" in the last 168 hours.  HbA1C: Hgb A1c MFr Bld  Date/Time Value Ref Range Status  05/25/2021 11:24 AM 5.8 (H) 4.8 - 5.6 % Final    Comment:    (NOTE) Pre diabetes:          5.7%-6.4%  Diabetes:              >6.4%  Glycemic control for   <7.0% adults with diabetes   11/06/2019 09:27 AM 6.1 (H) 4.8 - 5.6 % Final    Comment:    (NOTE) Pre diabetes:          5.7%-6.4%  Diabetes:              >6.4%  Glycemic control for   <7.0% adults with diabetes     CBG: Recent Labs  Lab 11/19/21 1133  GLUCAP 143*    Review of Systems:   Unable to complete with patient due to AMS in setting of septic shock.  Information obtained from staff, chart review and family at bedside.   Past Medical History:  He,  has a past medical history of AAA (abdominal aortic aneurysm) ([place]), Arthritis, Ascending aorta dilatation ([place]) (03/13/2012), BPH (benign prostatic hypertrophy), Chronic systolic heart failure ([place])  (03/03/2012), Chronic venous insufficiency, GERD (gastroesophageal reflux disease), Glaucoma, Gout, History of kidney stones, Hyperlipidemia, Hypertension, Obesity (BMI 30-39.9), Paroxysmal atrial fibrillation ([place]) (02/29/2012), PMR (polymyalgia rheumatica) ([place]), Polymyalgia rheumatica ([place]) (02/06/2019), Pre-diabetes, S/P CABG x 3 (03/06/2012), S/P Maze operation for atrial fibrillation (03/06/2012), S/P TAVR (transcatheter aortic valve replacement) (11/10/2019), Severe aortic stenosis (09/29/2019), and Sleep apnea.   Surgical History:   Past Surgical History:  Procedure Laterality Date   ABDOMINAL AORTOGRAM W/LOWER EXTREMITY N/A 10/26/2020   Procedure: ABDOMINAL AORTOGRAM W/LOWER EXTREMITY;  Surgeon: Broadus John, MD;  Location: [place] [facility];  Service: Cardiovascular;  Laterality: N/A;   ABDOMINAL AORTOGRAM W/LOWER EXTREMITY N/A 11/06/2021   Procedure: ABDOMINAL AORTOGRAM W/LOWER EXTREMITY;  Surgeon: Cherre Robins, MD;  Location: [place] [facility];  Service: Cardiovascular;  Laterality: N/A;   AMPUTATION Left 11/08/2021   Procedure: LEFT FOOT 2ND RAY AMPUTATION;  Surgeon: [name], MD;  Location: [place];  Service: Orthopedics;  Laterality: Left;   CARDIAC CATHETERIZATION     CATARACT EXTRACTION, BILATERAL     with lens implants   CHOLECYSTECTOMY N/A 03/24/2012   Procedure: LAPAROSCOPIC CHOLECYSTECTOMY WITH INTRAOPERATIVE CHOLANGIOGRAM;  Surgeon: Zenovia Jarred, MD;  Location: [place];  Service: General;  Laterality: N/A;   CORONARY ARTERY BYPASS GRAFT N/A 03/06/2012   Procedure: CORONARY ARTERY BYPASS GRAFTING (CABG);  Surgeon: Rexene Alberts, MD;  Location: [place];  Service: Open Heart Surgery;  Laterality: N/A;   ENDOVEIN HARVEST OF GREATER SAPHENOUS VEIN Right 03/06/2012   Procedure: ENDOVEIN HARVEST OF GREATER SAPHENOUS VEIN;  Surgeon: Rexene Alberts, MD;  Location: [place];  Service: Open Heart Surgery;  Laterality: Right;   INTRAOPERATIVE TRANSESOPHAGEAL ECHOCARDIOGRAM N/A  03/06/2012   Procedure: INTRAOPERATIVE TRANSESOPHAGEAL ECHOCARDIOGRAM;  Surgeon: Rexene Alberts, MD;  Location: [place];  Service: Open Heart Surgery;  Laterality: N/A;   IR KYPHO LUMBAR INC FX REDUCE BONE BX UNI/BIL CANNULATION INC/IMAGING  02/23/2020  LEFT HEART CATH Right 03/02/2012   Procedure: LEFT HEART CATH;  Surgeon: Sherren Mocha, MD;  Location: [facility];  Service: Cardiovascular;  Laterality: Right;   MAZE N/A 03/06/2012   Procedure: MAZE;  Surgeon: Rexene Alberts, MD;  Location: [place];  Service: Open Heart Surgery;  Laterality: N/A;   PERIPHERAL VASCULAR INTERVENTION Right 10/26/2020   Procedure: PERIPHERAL VASCULAR INTERVENTION;  Surgeon: Broadus John, MD;  Location: [place] [facility];  Service: Cardiovascular;  Laterality: Right;   PERIPHERAL VASCULAR INTERVENTION  11/06/2021   Procedure: PERIPHERAL VASCULAR INTERVENTION;  Surgeon: Cherre Robins, MD;  Location: [place] [facility];  Service: Cardiovascular;;   PROSTATECTOMY     partial   RIGHT/LEFT HEART CATH AND CORONARY/GRAFT ANGIOGRAPHY N/A 09/29/2019   Procedure: RIGHT/LEFT HEART CATH AND CORONARY/GRAFT ANGIOGRAPHY;  Surgeon: Adrian Prows, MD;  Location: [place] [facility];  Service: Cardiovascular;  Laterality: N/A;   TEE WITHOUT CARDIOVERSION N/A 11/10/2019   Procedure: TRANSESOPHAGEAL ECHOCARDIOGRAM (TEE);  Surgeon: Burnell Blanks, MD;  Location: [place];  Service: Open Heart Surgery;  Laterality: N/A;   TRANSCATHETER AORTIC VALVE REPLACEMENT, TRANSFEMORAL  11/10/2019     Social History:   reports that he quit smoking about 59 years ago. His smoking use included cigarettes. He has a 7.50 pack-year smoking history. He has never been exposed to tobacco smoke. He has never used smokeless tobacco. He reports current alcohol use of about 2.0 standard drinks of alcohol per week. He reports that he does not use drugs.   Family History:  His family history includes Dementia in his mother; Stroke in his brother.    Allergies Allergies  Allergen Reactions   Amoxicillin Other (See Comments)    Headache, debilitated   Bee Venom Anaphylaxis   Avelox [Moxifloxacin] Other (See Comments)    Unknown   Codeine Other (See Comments)    Unknown    Duloxetine Other (See Comments)    Unknown   Duloxetine Hcl Other (See Comments)    felt weird   Levitra [Vardenafil] Other (See Comments)    Unknown   Morphine And Related Other (See Comments)    hypotension   Penicillin G Sodium Other (See Comments)    Severe headache, fatigue "debilitated"   Testosterone Other (See Comments)    Unknown    Tizanidine Other (See Comments)    Unknown     Viagra [Sildenafil] Other (See Comments)    didn't like it     Home Medications  Prior to Admission medications   Medication Sig Start Date End Date Taking? Authorizing Provider  acetaminophen (TYLENOL) 325 MG tablet Take 2 tablets (650 mg total) by mouth every 4 (four) hours as needed for mild pain (or temp > 37.5 C (99.5 F)). Patient taking differently: Take 650 mg by mouth every 4 (four) hours as needed for mild pain. 06/13/21  Yes Angiulli, Lavon Paganini, PA-C  albuterol (PROVENTIL) (2.5 MG/3ML) 0.083% nebulizer solution Take 3 mLs (2.5 mg total) by nebulization every 6 (six) hours as needed for wheezing or shortness of breath. 11/14/21  Yes Dahal, Marlowe Aschoff, MD  alum & mag hydroxide-simeth (MAALOX/MYLANTA) 200-200-20 MG/5ML suspension Take 30 mLs by mouth every 2 (two) hours as needed for indigestion or heartburn.   Yes [provider]  amiodarone (PACERONE) 200 MG tablet Take 1 tablet (200 mg total) by mouth daily. 06/13/21  Yes Angiulli, Lavon Paganini, PA-C  apixaban (ELIQUIS) 2.5 MG TABS tablet Take 2.5 mg by mouth 2 (two) times daily.  Yes [provider]  aspirin EC 81 MG tablet Take 1 tablet (81 mg total) by mouth daily. Swallow whole. 11/15/21  Yes Dahal, Marlowe Aschoff, MD  atorvastatin (LIPITOR) 40 MG tablet Take 1 tablet (40 mg total) by mouth daily. 11/03/21   Yes Adrian Prows, MD  bisacodyl (DULCOLAX) 5 MG EC tablet Take 1 tablet (5 mg total) by mouth daily as needed for moderate constipation. 11/14/21  Yes Dahal, Marlowe Aschoff, MD  cholecalciferol (VITAMIN D3) 25 MCG (1000 UNIT) tablet Take 1 tablet (1,000 Units total) by mouth daily. 06/13/21  Yes Angiulli, Lavon Paganini, PA-C  Cyanocobalamin 5000 MCG SUBL Place 5,000 mcg under the tongue daily.   Yes [provider]  cyclobenzaprine (FLEXERIL) 5 MG tablet Take 5 mg by mouth 3 (three) times daily as needed for muscle spasms.   Yes [provider]  denosumab (PROLIA) 60 MG/ML SOSY injection Inject 60 mg into the skin every 6 (six) months.   Yes [provider]  docusate sodium (COLACE) 100 MG capsule Take 1 capsule (100 mg total) by mouth 2 (two) times daily. 08/23/20  Yes Hongalgi, Lenis Dickinson, MD  ezetimibe (ZETIA) 10 MG tablet Take 1 tablet (10 mg total) by mouth daily. 06/13/21  Yes Angiulli, Lavon Paganini, PA-C  feeding supplement, GLUCERNA SHAKE, (GLUCERNA SHAKE) LIQD Take 237 mLs by mouth 3 (three) times daily between meals. Patient taking differently: Take 237 mLs by mouth in the morning and at bedtime. 11/14/21  Yes Dahal, Marlowe Aschoff, MD  finasteride (PROSCAR) 5 MG tablet Take 1 tablet (5 mg total) by mouth daily. 06/13/21  Yes Angiulli, Lavon Paganini, PA-C  fish oil-omega-3 fatty acids 1000 MG capsule Take 1 capsule (1 g total) by mouth daily. 06/13/21  Yes Angiulli, Lavon Paganini, PA-C  gabapentin (NEURONTIN) 100 MG capsule Take 2 capsules by mouth in the morning, 1 capsule in the evening, and 2 capsules at bedtime. Patient taking differently: Take 100-200 mg by mouth See admin instructions. Take 200 mg by mouth in the morning and at bedtime. Take 100 mg by mouth in the evening at 1700 06/13/21  Yes Angiulli, Lavon Paganini, PA-C  galantamine (RAZADYNE ER) 8 MG 24 hr capsule Take 1 capsule (8 mg total) by mouth daily with breakfast. 06/13/21  Yes Angiulli, Lavon Paganini, PA-C  isosorbide mononitrate (IMDUR) 60 MG 24 hr  tablet Take 1 tablet (60 mg total) by mouth daily. 06/13/21  Yes Angiulli, Lavon Paganini, PA-C  leflunomide (ARAVA) 20 MG tablet Take 1/2 tablets (10 mg total) by mouth daily. 06/13/21  Yes Angiulli, Lavon Paganini, PA-C  lidocaine (LIDODERM) 5 % Place 1 patch onto the skin daily. Remove & Discard patch within 12 hours or as directed by MD 11/15/21  Yes Dahal, Marlowe Aschoff, MD  Magnesium Oxide 250 MG TABS Take 250 mg by mouth daily.   Yes [provider]  memantine (NAMENDA) 10 MG tablet Take 1 tablet (10 mg total) by mouth 2 (two) times daily. 06/13/21  Yes Angiulli, Lavon Paganini, PA-C  metoprolol succinate (TOPROL-XL) 25 MG 24 hr tablet Take 0.5 tablets (12.5 mg total) by mouth daily. 11/15/21  Yes Dahal, Marlowe Aschoff, MD  nitroGLYCERIN (NITROSTAT) 0.4 MG SL tablet Place 1 tablet (0.4 mg total) under the tongue every 5 (five) minutes x 3 doses as needed for chest pain. 12/16/19  Yes Cantwell, Celeste C, PA-C  nutrition supplement, JUVEN, (JUVEN) PACK Take 1 packet by mouth 2 (two) times daily between meals. 11/15/21  Yes Dahal, Marlowe Aschoff, MD  pantoprazole (PROTONIX) 40 MG tablet  Take 40 mg by mouth daily.   Yes [provider]  polyethylene glycol (MIRALAX) 17 g packet Take 17 g by mouth daily.   Yes [provider]  potassium chloride (KLOR-CON M) 10 MEQ tablet Take 1 tablet by mouth everyday with furosemide Patient taking differently: Take 10 mEq by mouth daily. 06/13/21  Yes Angiulli, Lavon Paganini, PA-C  predniSONE (DELTASONE) 1 MG tablet Take 3 tablets (3 mg total) by mouth daily with breakfast. 06/13/21  Yes Angiulli, Lavon Paganini, PA-C  Psyllium Husk POWD Take 1 Scoop by mouth daily.    Yes [provider]  tamsulosin (FLOMAX) 0.4 MG CAPS capsule Take 1 capsule (0.4 mg total) by mouth daily after supper. Patient taking differently: Take 0.4 mg by mouth every evening. 06/13/21  Yes Angiulli, Lavon Paganini, PA-C  torsemide (DEMADEX) 20 MG tablet Take 2 tablets (40 mg total) by mouth daily. 06/13/21  Yes  Angiulli, Lavon Paganini, PA-C  traMADol (ULTRAM) 50 MG tablet Take 0.5 tablets (25 mg total) by mouth every 6 (six) hours as needed for up to 5 days for moderate pain. 11/14/21 11/19/21 Yes Dahal, Marlowe Aschoff, MD  triamcinolone cream (KENALOG) 0.1 % Apply 1 Application topically as needed (rash/itching).   Yes [provider]  vitamin C (ASCORBIC ACID) 500 MG tablet Take 1 tablet (500 mg total) by mouth daily. 06/13/21  Yes Angiulli, Lavon Paganini, PA-C  Zinc 50 MG TABS Take 50 mg by mouth daily.   Yes [provider]  EPINEPHrine 0.3 mg/0.3 mL IJ SOAJ injection Inject 0.3 mg into the muscle as needed for anaphylaxis. Patient not taking: Reported on 11/19/2021    [provider]  guaiFENesin-dextromethorphan (ROBITUSSIN DM) 100-10 MG/5ML syrup Take 15 mLs by mouth every 4 (four) hours as needed for cough. Patient not taking: Reported on 11/19/2021 11/14/21   Terrilee Croak, MD  potassium chloride (MICRO-K) 10 MEQ CR capsule TAKE ONE CAPSULE BY MOUTH EVERY DAY WITH LASIX Patient taking differently: Take 10 mEq by mouth daily. 02/16/21 06/13/21  Alethia Berthold, PA-C     Critical care time:     CRITICAL CARE Performed by: Lanier Clam   Total critical care time: 45 minutes  Critical care time was exclusive of separately billable procedures and treating other patients.  Critical care was necessary to treat or prevent imminent or life-threatening deterioration.  Critical care was time spent personally by me on the following activities: development of treatment plan with patient and/or surrogate as well as nursing, discussions with consultants, evaluation of patient's response to treatment, examination of patient, obtaining history from patient or surrogate, ordering and performing treatments and interventions, ordering and review of laboratory studies, ordering and review of radiographic studies, pulse oximetry and re-evaluation of patient's condition.   Lanier Clam, MD [facility] 11/20/2021, 12:28 PM  Please see [email or web address] for contact info  From 7A-7P if no response, please call [phone] After hours, please call ELink [phone]

## 2021-11-21 ENCOUNTER — Inpatient Hospital Stay: Payer: Self-pay

## 2021-11-21 ENCOUNTER — Inpatient Hospital Stay (HOSPITAL_COMMUNITY): Payer: Medicare Other

## 2021-11-21 DIAGNOSIS — R6520 Severe sepsis without septic shock: Secondary | ICD-10-CM | POA: Diagnosis not present

## 2021-11-21 DIAGNOSIS — A419 Sepsis, unspecified organism: Secondary | ICD-10-CM | POA: Diagnosis not present

## 2021-11-21 LAB — APTT
aPTT: 74 seconds — ABNORMAL HIGH (ref 24–36)
aPTT: 55 seconds — ABNORMAL HIGH (ref 24–36)

## 2021-11-21 LAB — COMPREHENSIVE METABOLIC PANEL
ALT: 18 U/L (ref 0–44)
AST: 40 U/L (ref 15–41)
Albumin: 1.9 g/dL — ABNORMAL LOW (ref 3.5–5.0)
Alkaline Phosphatase: 140 U/L — ABNORMAL HIGH (ref 38–126)
Anion gap: 15 (ref 5–15)
BUN: 73 mg/dL — ABNORMAL HIGH (ref 8–23)
CO2: 17 mmol/L — ABNORMAL LOW (ref 22–32)
Calcium: 8.2 mg/dL — ABNORMAL LOW (ref 8.9–10.3)
Chloride: 109 mmol/L (ref 98–111)
Creatinine, Ser: 3 mg/dL — ABNORMAL HIGH (ref 0.61–1.24)
GFR, Estimated: 19 mL/min — ABNORMAL LOW (ref >60)
Glucose, Bld: 91 mg/dL (ref 70–99)
Potassium: 3.5 mmol/L (ref 3.5–5.1)
Sodium: 141 mmol/L (ref 135–145)
Total Bilirubin: 1.3 mg/dL — ABNORMAL HIGH (ref 0.3–1.2)
Total Protein: 5.4 g/dL — ABNORMAL LOW (ref 6.5–8.1)

## 2021-11-21 LAB — CULTURE, BLOOD (ROUTINE X 2): Special Requests: ADEQUATE

## 2021-11-21 LAB — CBC
HCT: 22.2 % — ABNORMAL LOW (ref 39.0–52.0)
Hemoglobin: 7.2 g/dL — ABNORMAL LOW (ref 13.0–17.0)
MCH: 28.6 pg (ref 26.0–34.0)
MCHC: 32.4 g/dL (ref 30.0–36.0)
MCV: 88.1 fL (ref 80.0–100.0)
Platelets: 67 10*3/uL — ABNORMAL LOW (ref 150–400)
RBC: 2.52 MIL/uL — ABNORMAL LOW (ref 4.22–5.81)
RDW: 17.2 % — ABNORMAL HIGH (ref 11.5–15.5)
WBC: 9.7 10*3/uL (ref 4.0–10.5)
nRBC: 0.2 % (ref 0.0–0.2)

## 2021-11-21 LAB — URINE CULTURE: Culture: >=100000 — AB

## 2021-11-21 LAB — MAGNESIUM: Magnesium: 1.9 mg/dL (ref 1.7–2.4)

## 2021-11-21 LAB — HEPARIN LEVEL (UNFRACTIONATED): Heparin Unfractionated: 0.86 IU/mL — ABNORMAL HIGH (ref 0.30–0.70)

## 2021-11-21 MED ORDER — IPRATROPIUM-ALBUTEROL 0.5-2.5 (3) MG/3ML IN SOLN
3.0000 mL | Freq: Four times a day (QID) | RESPIRATORY_TRACT | Status: DC | PRN
  Administered 2021-11-21: 3 mL via RESPIRATORY_TRACT
  Filled 2021-11-21: qty 3

## 2021-11-21 MED ORDER — SODIUM CHLORIDE 0.9% FLUSH
10.0000 mL | Freq: Two times a day (BID) | INTRAVENOUS | Status: DC
  Administered 2021-11-21 – 2021-11-24 (×5): 10 mL

## 2021-11-21 MED ORDER — ORAL CARE MOUTH RINSE
15.0000 mL | OROMUCOSAL | Status: DC
  Administered 2021-11-22 – 2021-11-24 (×10): 15 mL via OROMUCOSAL

## 2021-11-21 MED ORDER — HEPARIN (PORCINE) 25000 UT/250ML-% IV SOLN
1550.0000 [IU]/h | INTRAVENOUS | Status: DC
  Administered 2021-11-21: 1400 [IU]/h via INTRAVENOUS
  Filled 2021-11-21 (×2): qty 250

## 2021-11-21 MED ORDER — SODIUM CHLORIDE 0.9% FLUSH: 10.0000 mL | INTRAVENOUS | Status: DC | PRN

## 2021-11-21 MED ORDER — POTASSIUM CHLORIDE 10 MEQ/100ML IV SOLN
10.0000 meq | INTRAVENOUS | Status: AC
  Administered 2021-11-21 (×3): 10 meq via INTRAVENOUS
  Filled 2021-11-21 (×3): qty 100

## 2021-11-21 MED ORDER — ORAL CARE MOUTH RINSE: 15.0000 mL | OROMUCOSAL | Status: DC | PRN

## 2021-11-21 NOTE — Progress Notes (Addendum)
NAME:  [name] [name], MRN:  [id number], DOB:  [birth year], LOS: 2 ADMISSION DATE:  11/18/2021, CONSULTATION DATE:  11/19 REFERRING MD:  Dr. Louanne Belton, CHIEF COMPLAINT: AMS, fever    History of Present Illness:  88 y/o M who presented to [facility] ER on 11/18 with reports of AMS & fever from SNF.   Recent admission from 11/2-11/4 for peripheral vascular disease s/p femoral-popliteal angioplasty and stenting on 11/6 and bilateral foot wounds with s/p left second ray amputation per Dr. Sharol Given on 11/8.    Presented to ER with reports of altered mental status and fever from SNF.  Wife reports they have been concerned over his urine for the past several days and has been taking it to the office out of concern.  Rectal temperature on admission noted to be 104.9.  Family noted him to be slightly confused in the early part of the week prior to admission.  This progressed to lethargy and then somnolence.  EMS was activated 11/18 and found him to be hypoxic requiring 3 L O2.  Patient is known DNR/DNI with paperwork accompanying him to the hospital.  CXR essentially clear on presentation.  X-ray of left foot without bony abnormality.  UA concerning for UTI with turbid appearance, large leukocytes, 100 protein, many bacteria, greater than 50 WBC.  Initial labs-NA 133, K4.5, CL 95, CO2 21, glucose 140, BUN 86, creatinine 4.25, alk phos 278, albumin 2, AST 79, ALT 45, WBC 17.5, Hgb 17.8, platelets 171.  Lactic acid 3.1.  Influenza and COVID screening negative.  Blood cultures with GPC's in clusters on Gram stain.  In the emergency room he was treated with 3 L LR bolus followed by infusion at 150/h.  He was pancultured and empirically treated with cefepime, Flagyl and vancomycin.  Despite IVF, patient's blood pressure persisted with 67M systolic and was started on Levophed peripherally per TRH.  PCCM consulted for septic shock.  Pertinent  Medical History  CAD s/p CABG S/p maze for AF Severe aortic stenosis s/p  TAVR AAA Chronic systolic heart failure HTN HLD Former Smoker  Obesity PMR PVD s/p femoral-popliteal angioplasty and stenting 11/06/2021 OSA-on CPAP Gout Prediabetes BPH  Significant Hospital Events: Including procedures, antibiotic start and stop dates in addition to other pertinent events   11/18 Admit with concern for septic shock in setting of UTI 11/19 on pressors, encephaopathinc, renal failure  Interim History / Subjective:  Follows commands Hemodynamically stable Ready to transfer out of unit Objective   Blood pressure (!) 110/59, pulse 75, temperature 98.3 F (36.8 C), temperature source Oral, resp. rate 18, height 6' 1" (1.854 m), weight 98.5 kg, SpO2 95 %.        Intake/Output Summary (Last 24 hours) at 11/21/2021 0923 Last data filed at 11/21/2021 0600 Gross per 24 hour  Intake 3434.22 ml  Output 950 ml  Net 2484.22 ml   Filed Weights   11/18/21 1914 11/19/21 2000 11/20/21 0400  Weight: 93.4 kg 96.5 kg 98.5 kg    Examination:  General: Obese male in no acute distress on room air HEENT: MM pink/moist no JVD Neuro: Follows commands CV: Heart sounds are distant PULM: Diminished throughout  GI: soft, bsx4 active obese Extremities: warm/dry, left foot dressing dry and intact edema  Skin: no rashes or lesions, right toe with ulcer   Resolved Hospital Problem list      Assessment & Plan:   Septic Shock secondary to Staph Bacteremia, Psa UTI  MSSA detected on blood cultures / BCID.  >  100k CFU Psa on urine culture.  Monitor culture data Appreciate ID input Ready for transition to stepdown   AKI secondary to Sepsis/Shock, ATN, hypovolemia Lab Results  Component Value Date   CREATININE 3.00 (H) 11/21/2021   CREATININE 3.70 (H) 11/19/2021   CREATININE 4.05 (H) 11/19/2021   CREATININE 1.95 (H) 04/28/2021   CREATININE 1.1 04/22/2015   CREATININE 1.40 (H) 03/30/2014    Creatinine slowly improving  Off vasopressors  Recent Fem-Pop Stent  Placement  PVD Transition back to home medications of Eliquis and Imdur near future  Anemia PLT down 67 Recent Labs    11/20/21 0410 11/21/21 0343  HGB 7.3* 7.2*   Hemoglobin continues to drift downward currently. Attempted transition to home Eliquis continue to monitor platelets  CAD, s/p CABG, AS s/p TAVR, MAZE  Slowly resume home medications as tolerated Check swallow evaluation if okay can resume p.o. medications Currently on heparin drip  Baseline Dementia with Acute Metabolic Encephalopathy:  Continue supportive care    PMR  Consider resuming prednisone as outpatient  BPH Consider resuming Flomax  Best Practice (right click and "Reselect all SmartList Selections" daily)  Diet/type: NPO DVT prophylaxis: systemic heparin GI prophylaxis: PPI Lines: N/A Foley:  N/A Code Status:  DNR Last date of multidisciplinary goals of care discussion: goals of care on 11/19 per Dr. Valeta Harms, NP at bedside > family understand gravity of multisystem organ failure, staph infection and indicate they would not want further aggressive measures.  Discussed possibility that he may not survive this admission.  DNR/DNI. No central access. Continue peripheral vasopressors.   Labs   CBC: Recent Labs  Lab 11/18/21 2029 11/18/21 2122 11/19/21 0525 11/20/21 0410 11/21/21 0343  WBC 17.5*  --  18.1* 17.9* 9.7  NEUTROABS 16.3*  --   --   --   --   HGB 7.8* 8.2* 7.0* 7.3* 7.2*  HCT 25.6* 24.0* 23.1* 24.3* 22.2*  MCV 90.8  --  92.0 92.4 88.1  PLT 171  --  110* 106* 67*    Basic Metabolic Panel: Recent Labs  Lab 11/18/21 2029 11/18/21 2122 11/19/21 0433 11/19/21 1516 11/21/21 0343  NA 133* 132* 135 134* 141  K 4.5 4.0 4.3 3.8 3.5  CL 95*  --  98 103 109  CO2 21*  --  17* 19* 17*  GLUCOSE 140*  --  137* 144* 91  BUN 86*  --  83* 81* 73*  CREATININE 4.25*  --  4.05* 3.70* 3.00*  CALCIUM 8.8*  --  8.4* 7.9* 8.2*  MG  --   --   --   --  1.9   GFR: Estimated Creatinine Clearance:  19 mL/min (A) (by C-G formula based on SCr of 3 mg/dL (H)). Recent Labs  Lab 11/18/21 2029 11/18/21 2326 11/19/21 0433 11/19/21 0525 11/19/21 1516 11/20/21 0410 11/21/21 0343  PROCALCITON  --   --  112.32  --   --   --   --   WBC 17.5*  --   --  18.1*  --  17.9* 9.7  LATICACIDVEN 3.1* 4.0* 3.4*  --  1.5  --   --     Liver Function Tests: Recent Labs  Lab 11/18/21 2029 11/19/21 0433 11/19/21 1516 11/21/21 0343  AST 79* 90* 84* 40  ALT 45* 44 40 18  ALKPHOS 278* 207* 209* 140*  BILITOT 1.2 1.0 0.8 1.3*  PROT 6.5 5.6* 5.8* 5.4*  ALBUMIN 2.0* 1.6* 1.6* 1.9*   No results for input(s): "LIPASE", "AMYLASE"  in the last 168 hours. No results for input(s): "AMMONIA" in the last 168 hours.  ABG    Component Value Date/Time   PHART 7.422 11/06/2019 0946   PCO2ART 36.5 11/06/2019 0946   PO2ART 105 11/06/2019 0946   HCO3 21.3 11/18/2021 2122   TCO2 22 11/18/2021 2122   ACIDBASEDEF 4.0 (H) 11/18/2021 2122   O2SAT 75 11/18/2021 2122     Coagulation Profile: Recent Labs  Lab 11/18/21 2114 11/19/21 0433  INR 1.7* 1.8*    Cardiac Enzymes: No results for input(s): "CKTOTAL", "CKMB", "CKMBINDEX", "TROPONINI" in the last 168 hours.  HbA1C: Hgb A1c MFr Bld  Date/Time Value Ref Range Status  05/25/2021 11:24 AM 5.8 (H) 4.8 - 5.6 % Final    Comment:    (NOTE) Pre diabetes:          5.7%-6.4%  Diabetes:              >6.4%  Glycemic control for   <7.0% adults with diabetes   11/06/2019 09:27 AM 6.1 (H) 4.8 - 5.6 % Final    Comment:    (NOTE) Pre diabetes:          5.7%-6.4%  Diabetes:              >6.4%  Glycemic control for   <7.0% adults with diabetes     CBG: Recent Labs  Lab 11/19/21 1133  [place] care time: Ferol Luz Emonee Winkowski ACNP Acute Care Nurse Practitioner [place] Please consult [place] 11/21/2021, 9:23 AM

## 2021-11-21 NOTE — Progress Notes (Signed)
[facility] for Infectious Disease    Date of Admission:  11/18/2021   Total days of antibiotics 4/day 2 piptazo   ID: [name] [name] is a 88 y.o. male with hx of TAVR, recent 2nd ray of left foot amputation Principal Problem:   Severe sepsis ([place]) Active Problems:   CAD (coronary artery disease), native coronary artery   Essential hypertension   Paroxysmal atrial fibrillation (HCC)   OSA (obstructive sleep apnea)   Chronic diastolic CHF (congestive heart failure) (HCC)   CKD (chronic kidney disease) stage 3, GFR 30-59 ml/min (HCC)   AKI (acute kidney injury) (HCC)   Dementia (HCC)   Polymyalgia rheumatica (HCC)   Skin ulcers of foot, bilateral (HCC)   History of CVA with residual deficit   Acute metabolic encephalopathy   Sepsis secondary to UTI ([place])   Septic shock (HCC)    Subjective: Remains afebrile, but still encephalopathic.   Labs: repeat blood cx still showing + MRSA Medications:   Chlorhexidine Gluconate Cloth  6 each Topical Daily   mouth rinse  15 mL Mouth Rinse 4 times per day   pantoprazole (PROTONIX) IV  40 mg Intravenous Daily   sodium chloride flush  10-40 mL Intracatheter Q12H    Objective: Vital signs in last 24 hours: Temp:  [97.6 F (36.4 C)-98.6 F (37 C)] 98 F (36.7 C) (11/21 1509) Pulse Rate:  [65-89] 80 (11/21 1500) Resp:  [14-26] 19 (11/21 1500) BP: (95-124)/(29-90) 117/75 (11/21 1500) SpO2:  [95 %-99 %] 97 % (11/21 1500)  Physical Exam  Constitutional: He is oriented to person, only. He appears well-developed and well-nourished. No distress. Clearing his throat often HENT:  Mouth/Throat: Oropharynx is clear and moist. No oropharyngeal exudate.  Cardiovascular: Normal rate, regular rhythm and normal heart sounds. Exam reveals no gallop and no friction rub.  No murmur heard.  Pulmonary/Chest: Effort normal and breath sounds normal. Rhonchi to right base Abdominal: Soft. Bowel sounds are normal. He exhibits no distension. There is  no tenderness.  Lymphadenopathy:  He has no cervical adenopathy.  Neurological: He is alert and oriented to person, place, and time.  Skin: Skin is warm and dry. No rash noted. No erythema. Pressure ulcer to right heel.  Lab Results Recent Labs    11/19/21 1516 11/20/21 0410 11/21/21 0343  WBC  --  17.9* 9.7  HGB  --  7.3* 7.2*  HCT  --  24.3* 22.2*  NA 134*  --  141  K 3.8  --  3.5  CL 103  --  109  CO2 19*  --  17*  BUN 81*  --  73*  CREATININE 3.70*  --  3.00*   Liver Panel Recent Labs    11/19/21 1516 11/21/21 0343  PROT 5.8* 5.4*  ALBUMIN 1.6* 1.9*  AST 84* 40  ALT 40 18  ALKPHOS 209* 140*  BILITOT 0.8 1.3*    Microbiology: Blood cx 11/20 + Blood cx 11/18 + Studies/Results: [place] EKG SITE RITE  Result Date: 11/21/2021 If Site Rite image not attached, placement could not be confirmed due to current cardiac rhythm.  ECHOCARDIOGRAM COMPLETE  Result Date: 11/20/2021    ECHOCARDIOGRAM REPORT   Patient Name:   [name] [name] Date of Exam: 11/20/2021 Medical Rec #:  [id number]     Height:       73.0 in Accession #:    [id number]    Weight:       217.2 lb Date of Birth:  [birth year]      BSA:          2.228 m Patient Age:    22 years      BP:           89/65 mmHg Patient Gender: M             HR:           65 bpm. Exam Location:  Inpatient Procedure: 2D Echo, Cardiac Doppler and Color Doppler Indications:    Endocarditis  History:        Patient has prior history of Echocardiogram examinations, most                 recent 05/26/2021. CAD, Prior CABG; Risk Factors:Hypertension and                 Sleep Apnea. S/P Maze. Aortic Valve: 26 mm Sapien prosthetic,                 stented (TAVR) valve is present in the                 aortic position. Procedure Date: 11/10/19.  Sonographer:    Clayton Lefort RDCS (AE) Referring Phys: Emma [facility]  Sonographer Comments: Technically difficult study due to poor echo windows. IMPRESSIONS  1. Left ventricular ejection fraction, by estimation, is 55  to 60%. The left ventricle has normal function. The left ventricle has no regional wall motion abnormalities. There is moderate concentric left ventricular hypertrophy. Left ventricular diastolic parameters are indeterminate.  2. Right ventricular systolic function is normal. The right ventricular size is normal. There is normal pulmonary artery systolic pressure.  3. Left atrial size was moderately dilated.  4. The mitral valve is normal in structure. Trivial mitral valve regurgitation. No evidence of mitral stenosis.  5. Tricuspid valve regurgitation is moderate.  6. TAVR gradient essentially unchanged from 05/2021. Mild perivalvular regurgitation. The aortic valve has been repaired/replaced. Aortic valve regurgitation is mild. No aortic stenosis is present. Echo findings are consistent with normal structure and function of the aortic valve prosthesis. Aortic valve area, by VTI measures 1.62 cm. Aortic valve mean gradient measures 11.0 mmHg. Aortic valve Vmax measures 2.24 m/s.  7. Aortic dilatation noted. There is mild dilatation of the aortic root, measuring 40 mm.  8. The inferior vena cava is dilated in size with <50% respiratory variability, suggesting right atrial pressure of 15 mmHg. FINDINGS  Left Ventricle: Left ventricular ejection fraction, by estimation, is 55 to 60%. The left ventricle has normal function. The left ventricle has no regional wall motion abnormalities. The left ventricular internal cavity size was normal in size. There is  moderate concentric left ventricular hypertrophy. Left ventricular diastolic parameters are indeterminate. Right Ventricle: The right ventricular size is normal. No increase in right ventricular wall thickness. Right ventricular systolic function is normal. There is normal pulmonary artery systolic pressure. The tricuspid regurgitant velocity is 2.03 m/s, and  with an assumed right atrial pressure of 15 mmHg, the estimated right ventricular systolic pressure is 41.2  mmHg. Left Atrium: Left atrial size was moderately dilated. Right Atrium: Right atrial size was normal in size. Pericardium: There is no evidence of pericardial effusion. Mitral Valve: The mitral valve is normal in structure. There is mild thickening of the mitral valve leaflet(s). Mild mitral annular calcification. Trivial mitral valve regurgitation. No evidence of mitral valve stenosis. Tricuspid Valve: The tricuspid valve is normal in structure. Tricuspid valve regurgitation is moderate . No  evidence of tricuspid stenosis. Aortic Valve: TAVR gradient essentially unchanged from 05/2021. Mild perivalvular regurgitation. The aortic valve has been repaired/replaced. Aortic valve regurgitation is mild. No aortic stenosis is present. Aortic valve mean gradient measures 11.0 mmHg.  Aortic valve peak gradient measures 20.1 mmHg. Aortic valve area, by VTI measures 1.62 cm. There is a 26 mm Sapien prosthetic, stented (TAVR) valve present in the aortic position. Echo findings are consistent with normal structure and function of the aortic valve prosthesis. Pulmonic Valve: The pulmonic valve was normal in structure. Pulmonic valve regurgitation is not visualized. No evidence of pulmonic stenosis. Aorta: Aortic dilatation noted. There is mild dilatation of the aortic root, measuring 40 mm. Venous: The inferior vena cava is dilated in size with less than 50% respiratory variability, suggesting right atrial pressure of 15 mmHg. IAS/Shunts: No atrial level shunt detected by color flow Doppler.  LEFT VENTRICLE PLAX 2D LVIDd:         5.30 cm LVIDs:         3.60 cm LV PW:         1.30 cm LV IVS:        1.50 cm LVOT diam:     2.60 cm LV SV:         84 LV SV Index:   38 LVOT Area:     5.31 cm  RIGHT VENTRICLE            IVC RV Basal diam:  3.40 cm    IVC diam: 2.30 cm RV S prime:     8.59 cm/s TAPSE (M-mode): 1.0 cm LEFT ATRIUM             Index        RIGHT ATRIUM           Index LA diam:        3.70 cm 1.66 cm/m   RA Area:      21.00 cm LA Vol (A2C):   81.4 ml 36.53 ml/m  RA Volume:   60.10 ml  26.97 ml/m LA Vol (A4C):   84.3 ml 37.83 ml/m LA Biplane Vol: 85.0 ml 38.15 ml/m  AORTIC VALVE AV Area (Vmax):    2.04 cm AV Area (Vmean):   1.90 cm AV Area (VTI):     1.62 cm AV Vmax:           224.00 cm/s AV Vmean:          158.000 cm/s AV VTI:            0.519 m AV Peak Grad:      20.1 mmHg AV Mean Grad:      11.0 mmHg LVOT Vmax:         85.90 cm/s LVOT Vmean:        56.600 cm/s LVOT VTI:          0.158 m LVOT/AV VTI ratio: 0.30  AORTA Ao Root diam: 4.00 cm TRICUSPID VALVE TR Peak grad:   16.5 mmHg TR Vmax:        203.00 cm/s  SHUNTS Systemic VTI:  0.16 m Systemic Diam: 2.60 cm Skeet Latch MD Electronically signed by Skeet Latch MD Signature Date/Time: 11/20/2021/4:10:47 PM    Final      Assessment/Plan: 88yo M with hx of TAVR, ongoing bacteremia. Concern that we don't have source control. - continue on piptazo (for psa uti) but then narrow back to cefazolin - will recommend to get non con brain mri to see if any cns  septic emboli - will repeat blood cx tomorrow - right heel may also be a source of infection, will get wound care recs and consider getting ct of right foot after brain mri - for now plan a minimum of 4 wk of treat from clearance of bacteremia Maybe too high risk for TEE for risk of aspiration pneumonia/pneumonitis -and may need to treat for 6 wk  Leukocytosis = improved  PsA uti = currently on piptazo. This is day 4 of treatment  [facility] for Infectious Diseases Pager: [phone]  11/21/2021, 4:18 PM

## 2021-11-21 NOTE — Progress Notes (Signed)
[facility] Progress Note Patient Name: [name] [name] DOB: 1926-03-07 MRN: [id number]   Date of Service  11/21/2021  HPI/Events of Note  K 3.5 and creat 3.0  eICU Interventions  Potassium replaced     Intervention Category Major Interventions: Electrolyte abnormality - evaluation and management  Mauri [place], P 11/21/2021, 5:13 AM

## 2021-11-21 NOTE — Progress Notes (Signed)
Peripherally Inserted Central Catheter Placement  The IV Nurse has discussed with the patient and/or persons authorized to consent for the patient, the purpose of this procedure and the potential benefits and risks involved with this procedure.  The benefits include less needle sticks, lab draws from the catheter, and the patient may be discharged home with the catheter. Risks include, but not limited to, infection, bleeding, blood clot (thrombus formation), and puncture of an artery; nerve damage and irregular heartbeat and possibility to perform a PICC exchange if needed/ordered by physician.  Alternatives to this procedure were also discussed.  Bard Power PICC patient education guide, fact sheet on infection prevention and patient information card has been provided to patient /or left at bedside.  Telephone consent obtained from wife due to altered mental status.  PICC Placement Documentation  PICC Double Lumen 03/55/97 Left Basilic 45 cm 0 cm (Active)  Indication for Insertion or Continuance of Line Prolonged intravenous therapies;Limited venous access - need for IV therapy >5 days (PICC only) 11/21/21 2105  Exposed Catheter (cm) 0 cm 11/21/21 2105  Site Assessment Clean, Dry, Intact 11/21/21 2105  Lumen #1 Status Flushed;Saline locked;Blood return noted 11/21/21 2105  Lumen #2 Status Flushed;Saline locked;Blood return noted 11/21/21 2105  Dressing Type Transparent;Securing device 11/21/21 2105  Dressing Status Antimicrobial disc in place;Clean, Dry, Intact 11/21/21 2105  Safety Lock Not Applicable 41/63/84 5364  Line Care Connections checked and tightened 11/21/21 2105  Dressing Intervention New dressing 11/21/21 2105  Dressing Change Due 11/28/21 11/21/21 2105       Syvilla Martin, Nicolette Bang 11/21/2021, 9:06 PM

## 2021-11-21 NOTE — Evaluation (Signed)
Clinical/Bedside Swallow Evaluation Patient Details  Name: [name] [name] MRN: [id number] Date of Birth: 01-14-1926  Today's Date: 11/21/2021 Time: SLP Start Time (ACUTE ONLY): 1107 SLP Stop Time (ACUTE ONLY): 1121 SLP Time Calculation (min) (ACUTE ONLY): 14 min  Past Medical History:  Past Medical History:  Diagnosis Date   AAA (abdominal aortic aneurysm) ([place])    Arthritis    Ascending aorta dilatation ([place]) 03/13/2012   Fusiform dilatation of the ascending thoracic aorta discovered during surgery, max diameter 4.2-4.3 cm   BPH (benign prostatic hypertrophy)    Chronic systolic heart failure ([place]) 03/03/2012   Chronic venous insufficiency    GERD (gastroesophageal reflux disease)    Glaucoma    Gout    History of kidney stones    Hyperlipidemia    Hypertension    Obesity (BMI 30-39.9)    Paroxysmal atrial fibrillation ([place]) 02/29/2012   Recurrent paroxysmal, new-onset    PMR (polymyalgia rheumatica) (HCC)    Polymyalgia rheumatica ([place]) 02/06/2019   Pre-diabetes    S/P CABG x 3 03/06/2012   LIMA to LAD, SVG to OM, SVG to RCA, EVH via right thigh   S/P Maze operation for atrial fibrillation 03/06/2012   Complete bilateral lesions set using bipolar radiofrequency and cryothermy ablation with clipping of LA appendage   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   s/p TAVR with a 26 mm Edwards via the left subclavian by Drs Buena Irish and Bartle   Severe aortic stenosis 09/29/2019   Sleep apnea    USES CPAP NIGHTLY   Past Surgical History:  Past Surgical History:  Procedure Laterality Date   ABDOMINAL AORTOGRAM W/LOWER EXTREMITY N/A 10/26/2020   Procedure: ABDOMINAL AORTOGRAM W/LOWER EXTREMITY;  Surgeon: Broadus John, MD;  Location: [facility];  Service: Cardiovascular;  Laterality: N/A;   ABDOMINAL AORTOGRAM W/LOWER EXTREMITY N/A 11/06/2021   Procedure: ABDOMINAL AORTOGRAM W/LOWER EXTREMITY;  Surgeon: Cherre Robins, MD;  Location: [facility];  Service: Cardiovascular;  Laterality: N/A;   AMPUTATION Left 11/08/2021   Procedure: LEFT FOOT 2ND RAY AMPUTATION;  Surgeon: [name], MD;  Location: [facility];  Service: Orthopedics;  Laterality: Left;   CARDIAC CATHETERIZATION     CATARACT EXTRACTION, BILATERAL     with lens implants   CHOLECYSTECTOMY N/A 03/24/2012   Procedure: LAPAROSCOPIC CHOLECYSTECTOMY WITH INTRAOPERATIVE CHOLANGIOGRAM;  Surgeon: Zenovia Jarred, MD;  Location: [facility];  Service: General;  Laterality: N/A;   CORONARY ARTERY BYPASS GRAFT N/A 03/06/2012   Procedure: CORONARY ARTERY BYPASS GRAFTING (CABG);  Surgeon: Rexene Alberts, MD;  Location: [facility];  Service: Open Heart Surgery;  Laterality: N/A;   ENDOVEIN HARVEST OF GREATER SAPHENOUS VEIN Right 03/06/2012   Procedure: ENDOVEIN HARVEST OF GREATER SAPHENOUS VEIN;  Surgeon: Rexene Alberts, MD;  Location: [facility];  Service: Open Heart Surgery;  Laterality: Right;   INTRAOPERATIVE TRANSESOPHAGEAL ECHOCARDIOGRAM N/A 03/06/2012   Procedure: INTRAOPERATIVE TRANSESOPHAGEAL ECHOCARDIOGRAM;  Surgeon: Rexene Alberts, MD;  Location: [facility];  Service: Open Heart Surgery;  Laterality: N/A;   IR KYPHO LUMBAR INC FX REDUCE BONE BX UNI/BIL CANNULATION INC/IMAGING  02/23/2020   LEFT HEART CATH Right 03/02/2012   Procedure: LEFT HEART CATH;  Surgeon: Sherren Mocha, MD;  Location: [facility];  Service: Cardiovascular;  Laterality: Right;   MAZE N/A 03/06/2012   Procedure: MAZE;  Surgeon: Rexene Alberts, MD;  Location: [facility];  Service: Open Heart Surgery;  Laterality: N/A;   PERIPHERAL VASCULAR INTERVENTION Right  10/26/2020   Procedure: PERIPHERAL VASCULAR INTERVENTION;  Surgeon: Broadus John, MD;  Location: [facility];  Service: Cardiovascular;  Laterality: Right;   PERIPHERAL VASCULAR INTERVENTION  11/06/2021   Procedure: PERIPHERAL VASCULAR INTERVENTION;  Surgeon: Cherre Robins, MD;  Location: [facility];  Service: Cardiovascular;;   PROSTATECTOMY     partial    RIGHT/LEFT HEART CATH AND CORONARY/GRAFT ANGIOGRAPHY N/A 09/29/2019   Procedure: RIGHT/LEFT HEART CATH AND CORONARY/GRAFT ANGIOGRAPHY;  Surgeon: Adrian Prows, MD;  Location: [facility];  Service: Cardiovascular;  Laterality: N/A;   TEE WITHOUT CARDIOVERSION N/A 11/10/2019   Procedure: TRANSESOPHAGEAL ECHOCARDIOGRAM (TEE);  Surgeon: Burnell Blanks, MD;  Location: [facility];  Service: Open Heart Surgery;  Laterality: N/A;   TRANSCATHETER AORTIC VALVE REPLACEMENT, TRANSFEMORAL  11/10/2019   HPI:  Pt is a 88 y.o. male who presented to [facility] ER on 11/18 with reports of AMS & fever from SNF. Admitted with septic shock secondary to Staph Bacteremia, Psa UTI.  Recent admission from 11/2-11/4 for peripheral vascular disease s/p femoral-popliteal angioplasty and stenting on 11/6 and bilateral foot wounds with s/p left second ray amputation per Dr. Sharol Given on 11/8. CXR (11/18)- neg. PMH: dementia, CAD s/p CABG, PVD, HTN, HLD wheelchair-bound at baseline, aphasia post CVA (seeing OP SLP services just PTA).    Assessment / Plan / Recommendation  Clinical Impression  Pt lethargic, but initially followed simple commands and provided simple verbal responses to clinician's questions with eyes largely closed. Brief eye opening obtained at varying periods and pt actively accepted/manipulated all POs. Ice chips and thin liquids by cup trialed, which were significant for reduced labial seal, anterior loss and frequent throat clearing with intermittent coughing. Pt's alertness progressively declined and POs terminated. Wife, at bedside, reports pt often lethargic/falling asleep during meals PTA and with poor intake. Recommend continue NPO at this time due to clinical presentation this date which appeared highly impacted by level of alertness. Will f/u for PO readiness.  SLP Visit Diagnosis: Dysphagia, unspecified (R13.10)    Aspiration Risk  Severe aspiration risk    Diet Recommendation NPO   Medication  Administration: Via alternative means    Other  Recommendations Oral Care Recommendations: Oral care QID    Recommendations for follow up therapy are one component of a multi-disciplinary discharge planning process, led by the attending physician.  Recommendations may be updated based on patient status, additional functional criteria and insurance authorization.  Follow up Recommendations Other (comment) (TBD)      Assistance Recommended at Discharge    Functional Status Assessment Patient has had a recent decline in their functional status and demonstrates the ability to make significant improvements in function in a reasonable and predictable amount of time.  Frequency and Duration min 2x/week  2 weeks       Prognosis Prognosis for Safe Diet Advancement: Good Barriers to Reach Goals: Cognitive deficits;Time post onset      Swallow Study   General Date of Onset: 11/21/21 HPI: Pt is a 88 y.o. male who presented to [facility] ER on 11/18 with reports of AMS & fever from SNF. Admitted with septic shock secondary to Staph Bacteremia, Psa UTI.  Recent admission from 11/2-11/4 for peripheral vascular disease s/p femoral-popliteal angioplasty and stenting on 11/6 and bilateral foot wounds with s/p left second ray amputation per Dr. Sharol Given on 11/8. CXR (11/18)- neg. PMH: dementia, CAD s/p CABG, PVD, HTN, HLD wheelchair-bound at baseline, aphasia post CVA (seeing OP SLP services just  PTA). Type of Study: Bedside Swallow Evaluation Previous Swallow Assessment: FEES in SNF per wife (recommended reg diet/thin liquids no straw) Diet Prior to this Study: NPO Temperature Spikes Noted: No Respiratory Status: Room air History of Recent Intubation: No Behavior/Cognition: Lethargic/Drowsy;Cooperative Oral Cavity Assessment: Within Functional Limits Oral Cavity - Dentition: Adequate natural dentition Vision:  (difficult to assess) Self-Feeding Abilities: Total assist Patient Positioning: Upright in  bed;Postural control adequate for testing Baseline Vocal Quality: Normal Volitional Cough: Strong Volitional Swallow: Able to elicit    Oral/Motor/Sensory Function Overall Oral Motor/Sensory Function:  (difficult to assess (no obvious facial/lingual asymmetry))   Ice Chips Ice chips: Impaired Presentation: Spoon Oral Phase Impairments: Reduced labial seal Pharyngeal Phase Impairments: Throat Clearing - Immediate;Cough - Immediate   Thin Liquid Thin Liquid: Impaired Presentation: Cup Oral Phase Impairments: Reduced labial seal Oral Phase Functional Implications:  (anterior loss) Pharyngeal  Phase Impairments: Cough - Immediate;Throat Clearing - Immediate    Nectar Thick Nectar Thick Liquid: Not tested   Honey Thick Honey Thick Liquid: Not tested   Puree Puree: Not tested   Solid     Solid: Not tested       Ellwood Dense, [place], [place] Office Number: [phone]  Acie Fredrickson 11/21/2021,11:50 AM

## 2021-11-21 NOTE — Progress Notes (Signed)
ANTICOAGULATION CONSULT NOTE - Follow Up Consult  Pharmacy Consult for Heparin Indication: Atrial Fibrillation  Allergies  Allergen Reactions   Amoxicillin Other (See Comments)    Headache, debilitated   Bee Venom Anaphylaxis   Avelox [Moxifloxacin] Other (See Comments)    Unknown   Codeine Other (See Comments)    Unknown    Duloxetine Other (See Comments)    Unknown   Duloxetine Hcl Other (See Comments)    felt weird   Levitra [Vardenafil] Other (See Comments)    Unknown   Morphine And Related Other (See Comments)    hypotension   Penicillin G Sodium Other (See Comments)    Severe headache, fatigue "debilitated"   Testosterone Other (See Comments)    Unknown    Tizanidine Other (See Comments)    Unknown     Viagra [Sildenafil] Other (See Comments)    didn't like it    Patient Measurements: Height: '6\' 1"'$  (185.4 cm) Weight: 98.5 kg (217 lb 2.5 oz) IBW/kg (Calculated) : 79.9 Heparin Dosing Weight: 93kg  Vital Signs: Temp: 98.6 F (37 C) (11/21 0333) Temp Source: Axillary (11/21 0333) BP: 110/59 (11/21 0200) Pulse Rate: 68 (11/21 0400)  Labs: Recent Labs    11/18/21 2029 11/18/21 2029 11/18/21 2114 11/18/21 2122 11/19/21 0253 11/19/21 0433 11/19/21 0525 11/19/21 1516 11/19/21 2130 11/20/21 0410 11/20/21 1618 11/21/21 0343  HGB 7.8*  --   --  8.2*  --   --  7.0*  --   --  7.3*  --   --   HCT 25.6*  --   --  24.0*  --   --  23.1*  --   --  24.3*  --   --   PLT 171  --   --   --   --   --  110*  --   --  106*  --   --   APTT  --    < > 33  --   --   --   --  188*   < > 56* 61* 74*  LABPROT  --   --  19.5*  --   --  20.6*  --   --   --   --   --   --   INR  --   --  1.7*  --   --  1.8*  --   --   --   --   --   --   HEPARINUNFRC  --   --   --   --    < >  --   --   --    < > >1.10* >1.10* 0.86*  CREATININE 4.25*  --   --   --   --  4.05*  --  3.70*  --   --   --  3.00*   < > = values in this interval not displayed.     Estimated Creatinine Clearance:  19 mL/min (A) (by C-G formula based on SCr of 3 mg/dL (H)).   Medications:  Scheduled:   Chlorhexidine Gluconate Cloth  6 each Topical Daily   pantoprazole (PROTONIX) IV  40 mg Intravenous Daily   sodium chloride flush  10-40 mL Intracatheter Q12H   Infusions:   sodium chloride 10 mL/hr at 11/21/21 0400   sodium chloride Stopped (11/19/21 0808)   heparin 1,200 Units/hr (11/21/21 0400)   norepinephrine (LEVOPHED) Adult infusion Stopped (11/20/21 1141)   piperacillin-tazobactam (ZOSYN)  IV Stopped (11/21/21 0019)  Assessment: 70 YOM admitting with sepsis, on Eliquis PTA for atrial fibrillation. Last dose taken on 11/17. Pharmacy consulted to dose heparin.  aPTT 61 on heparin 1100 units/hr. Heparin level remains >1.1 given recent Eliquis use. CBC stable with no signs of bleeding.   Continue to monitor aPTT and heparin level until correlating.  11/21 AM update:  aPTT therapeutic  Goal of Therapy:  Heparin level 0.3-0.7 units/ml aPTT 66-102 seconds Monitor platelets by anticoagulation protocol: Yes   Plan:  Cont heparin 1200 units/hr Heparin level and aPTT in 8 hours Daily CBC and heparin level/aPTT until correlating  Narda Bonds, PharmD, [place] Pharmacist Phone: [phone]

## 2021-11-21 NOTE — Progress Notes (Signed)
ANTICOAGULATION CONSULT NOTE - Follow Up Consult  Pharmacy Consult for Heparin Indication: Atrial Fibrillation  Allergies  Allergen Reactions   Amoxicillin Other (See Comments)    Headache, debilitated   Bee Venom Anaphylaxis   Avelox [Moxifloxacin] Other (See Comments)    Unknown   Codeine Other (See Comments)    Unknown    Duloxetine Other (See Comments)    Unknown   Duloxetine Hcl Other (See Comments)    felt weird   Levitra [Vardenafil] Other (See Comments)    Unknown   Morphine And Related Other (See Comments)    hypotension   Penicillin G Sodium Other (See Comments)    Severe headache, fatigue "debilitated"   Testosterone Other (See Comments)    Unknown    Tizanidine Other (See Comments)    Unknown     Viagra [Sildenafil] Other (See Comments)    didn't like it    Patient Measurements: Height: '6\' 1"'$  (185.4 cm) Weight: 98.5 kg (217 lb 2.5 oz) IBW/kg (Calculated) : 79.9 Heparin Dosing Weight: 93kg  Vital Signs: Temp: 97.6 F (36.4 C) (11/21 1125) Temp Source: Axillary (11/21 1125) BP: 115/83 (11/21 1100) Pulse Rate: 72 (11/21 1100)  Labs: Recent Labs    11/18/21 2114 11/18/21 2122 11/19/21 0433 11/19/21 0525 11/19/21 1516 11/19/21 2130 11/20/21 0410 11/20/21 1618 11/21/21 0343 11/21/21 1242  HGB  --    < >  --  7.0*  --   --  7.3*  --  7.2*  --   HCT  --    < >  --  23.1*  --   --  24.3*  --  22.2*  --   PLT  --   --   --  110*  --   --  106*  --  67*  --   APTT 33  --   --   --  188*   < > 56* 61* 74* 55*  LABPROT 19.5*  --  20.6*  --   --   --   --   --   --   --   INR 1.7*  --  1.8*  --   --   --   --   --   --   --   HEPARINUNFRC  --    < >  --   --   --    < > >1.10* >1.10* 0.86*  --   CREATININE  --   --  4.05*  --  3.70*  --   --   --  3.00*  --    < > = values in this interval not displayed.     Estimated Creatinine Clearance: 19 mL/min (A) (by C-G formula based on SCr of 3 mg/dL (H)).   Medications:  Scheduled:   Chlorhexidine  Gluconate Cloth  6 each Topical Daily   pantoprazole (PROTONIX) IV  40 mg Intravenous Daily   sodium chloride flush  10-40 mL Intracatheter Q12H   Infusions:   sodium chloride Stopped (11/21/21 1005)   sodium chloride Stopped (11/19/21 0808)   heparin 1,200 Units/hr (11/21/21 1100)   piperacillin-tazobactam (ZOSYN)  IV 2.25 g (11/21/21 1204)    Assessment: 61 YOM admitting with sepsis, on Eliquis PTA for atrial fibrillation. Last dose taken on 11/17. Pharmacy consulted to dose heparin.  Confirmatory aPTT of 55 is subtherapeutic on heparin 1200 units/hr. Per RN no issues with IV infusion/access. Level drawn appropriately. Hgb 7.2. Plts down to 67. No bleeding noted.   Goal  of Therapy:  Heparin level 0.3-0.7 units/ml aPTT 66-102 seconds Monitor platelets by anticoagulation protocol: Yes   Plan:  Increase heparin to 1400 units/hr  Check 8 hr aPTT Daily CBC and heparin level/aPTT until correlating  Cristela Felt, PharmD, BCPS Clinical Pharmacist 11/21/2021 1:21 PM

## 2021-11-21 NOTE — Progress Notes (Signed)
Dear Doctor:  This patient has been identified as a candidate for PICC for the following reason (s): poor veins/poor circulatory system (CHF, COPD, emphysema, diabetes, steroid use, IV drug abuse, etc.) If you agree, please write an order for the indicated device. For any questions contact the Vascular Access Team at [phone] if no answer, please leave a message.  Thank you for supporting the early vascular access assessment program. 

## 2021-11-22 DIAGNOSIS — Z515 Encounter for palliative care: Secondary | ICD-10-CM | POA: Diagnosis not present

## 2021-11-22 DIAGNOSIS — A419 Sepsis, unspecified organism: Secondary | ICD-10-CM | POA: Diagnosis not present

## 2021-11-22 DIAGNOSIS — R627 Adult failure to thrive: Secondary | ICD-10-CM

## 2021-11-22 DIAGNOSIS — Z66 Do not resuscitate: Secondary | ICD-10-CM

## 2021-11-22 DIAGNOSIS — R6520 Severe sepsis without septic shock: Secondary | ICD-10-CM | POA: Diagnosis not present

## 2021-11-22 DIAGNOSIS — Z7189 Other specified counseling: Secondary | ICD-10-CM | POA: Diagnosis not present

## 2021-11-22 LAB — CBC WITH DIFFERENTIAL/PLATELET
Abs Immature Granulocytes: 0.08 10*3/uL — ABNORMAL HIGH (ref 0.00–0.07)
Basophils Absolute: 0.1 10*3/uL (ref 0.0–0.1)
Basophils Relative: 1 %
Eosinophils Absolute: 0.1 10*3/uL (ref 0.0–0.5)
Eosinophils Relative: 1 %
HCT: 20.2 % — ABNORMAL LOW (ref 39.0–52.0)
Hemoglobin: 6.5 g/dL — CL (ref 13.0–17.0)
Immature Granulocytes: 1 %
Lymphocytes Relative: 9 %
Lymphs Abs: 0.6 10*3/uL — ABNORMAL LOW (ref 0.7–4.0)
MCH: 28.9 pg (ref 26.0–34.0)
MCHC: 32.2 g/dL (ref 30.0–36.0)
MCV: 89.8 fL (ref 80.0–100.0)
Monocytes Absolute: 0.5 10*3/uL (ref 0.1–1.0)
Monocytes Relative: 8 %
Neutro Abs: 5 10*3/uL (ref 1.7–7.7)
Neutrophils Relative %: 80 %
Platelets: 52 10*3/uL — ABNORMAL LOW (ref 150–400)
RBC: 2.25 MIL/uL — ABNORMAL LOW (ref 4.22–5.81)
RDW: 17.1 % — ABNORMAL HIGH (ref 11.5–15.5)
WBC: 6.3 10*3/uL (ref 4.0–10.5)
nRBC: 0 % (ref 0.0–0.2)

## 2021-11-22 LAB — HEPARIN LEVEL (UNFRACTIONATED): Heparin Unfractionated: 0.53 IU/mL (ref 0.30–0.70)

## 2021-11-22 LAB — URINALYSIS, MICROSCOPIC (REFLEX)
Bacteria, UA: NONE SEEN
RBC / HPF: >50 RBC/hpf (ref 0–5)
Squamous Epithelial / HPF: NONE SEEN (ref 0–5)

## 2021-11-22 LAB — URINALYSIS, ROUTINE W REFLEX MICROSCOPIC

## 2021-11-22 LAB — CULTURE, BLOOD (ROUTINE X 2): Special Requests: ADEQUATE

## 2021-11-22 LAB — PROTIME-INR
INR: 1.3 — ABNORMAL HIGH (ref 0.8–1.2)
Prothrombin Time: 16.3 seconds — ABNORMAL HIGH (ref 11.4–15.2)

## 2021-11-22 LAB — PREPARE RBC (CROSSMATCH)

## 2021-11-22 LAB — APTT: aPTT: 60 seconds — ABNORMAL HIGH (ref 24–36)

## 2021-11-22 MED ORDER — SENNA 8.6 MG PO TABS: 1.0000 | ORAL_TABLET | Freq: Every day | ORAL | Status: DC

## 2021-11-22 MED ORDER — SODIUM CHLORIDE 0.9 % IV SOLN
12.0000 g | INTRAVENOUS | Status: DC
  Administered 2021-11-22 – 2021-11-24 (×3): 12 g via INTRAVENOUS
  Filled 2021-11-22 (×3): qty 48

## 2021-11-22 MED ORDER — METOPROLOL TARTRATE 5 MG/5ML IV SOLN
2.5000 mg | Freq: Four times a day (QID) | INTRAVENOUS | Status: DC
  Administered 2021-11-22 – 2021-11-24 (×10): 2.5 mg via INTRAVENOUS
  Filled 2021-11-22 (×10): qty 5

## 2021-11-22 MED ORDER — SODIUM CHLORIDE 0.9% IV SOLUTION: Freq: Once | INTRAVENOUS | Status: AC

## 2021-11-22 MED ORDER — METHYLPREDNISOLONE SODIUM SUCC 40 MG IJ SOLR
40.0000 mg | Freq: Every day | INTRAMUSCULAR | Status: DC
  Administered 2021-11-22 – 2021-11-24 (×3): 40 mg via INTRAVENOUS
  Filled 2021-11-22 (×3): qty 1

## 2021-11-22 MED ORDER — MEDIHONEY WOUND/BURN DRESSING EX PSTE
1.0000 | PASTE | Freq: Every day | CUTANEOUS | Status: DC
  Administered 2021-11-23 – 2021-11-24 (×2): 1 via TOPICAL
  Filled 2021-11-22: qty 44

## 2021-11-22 NOTE — Progress Notes (Signed)
ANTICOAGULATION CONSULT NOTE - Follow Up Consult  Pharmacy Consult for Heparin Indication: Atrial Fibrillation  Allergies  Allergen Reactions   Amoxicillin Other (See Comments)    Headache, debilitated   Bee Venom Anaphylaxis   Avelox [Moxifloxacin] Other (See Comments)    Unknown   Codeine Other (See Comments)    Unknown    Duloxetine Other (See Comments)    Unknown   Duloxetine Hcl Other (See Comments)    felt weird   Levitra [Vardenafil] Other (See Comments)    Unknown   Morphine And Related Other (See Comments)    hypotension   Penicillin G Sodium Other (See Comments)    Severe headache, fatigue "debilitated"   Testosterone Other (See Comments)    Unknown    Tizanidine Other (See Comments)    Unknown     Viagra [Sildenafil] Other (See Comments)    didn't like it    Patient Measurements: Height: '6\' 1"'$  (185.4 cm) Weight: 98.5 kg (217 lb 2.5 oz) IBW/kg (Calculated) : 79.9 Heparin Dosing Weight: 93kg  Vital Signs: Temp: 97.6 F (36.4 C) (11/22 0449) Temp Source: Oral (11/22 0449) BP: 117/63 (11/22 0449) Pulse Rate: 64 (11/22 0449)  Labs: Recent Labs    11/19/21 1516 11/19/21 2130 11/20/21 0410 11/20/21 1618 11/21/21 0343 11/21/21 1242 11/22/21 0435  HGB  --    < > 7.3*  --  7.2*  --  6.5*  HCT  --   --  24.3*  --  22.2*  --  20.2*  PLT  --   --  106*  --  67*  --  52*  APTT 188*   < > 56* 61* 74* 55* 60*  HEPARINUNFRC  --    < > >1.10* >1.10* 0.86*  --  0.53  CREATININE 3.70*  --   --   --  3.00*  --   --    < > = values in this interval not displayed.     Estimated Creatinine Clearance: 19 mL/min (A) (by C-G formula based on SCr of 3 mg/dL (H)).   Medications:  Scheduled:   sodium chloride   Intravenous Once   Chlorhexidine Gluconate Cloth  6 each Topical Daily   mouth rinse  15 mL Mouth Rinse 4 times per day   pantoprazole (PROTONIX) IV  40 mg Intravenous Daily   sodium chloride flush  10-40 mL Intracatheter Q12H   sodium chloride flush   10-40 mL Intracatheter Q12H   Infusions:   sodium chloride 10 mL/hr at 11/21/21 2200   sodium chloride Stopped (11/19/21 0808)   heparin 1,400 Units/hr (11/22/21 0100)   piperacillin-tazobactam (ZOSYN)  IV 2.25 g (11/22/21 0300)    Assessment: 20 YOM admitting with sepsis, on Eliquis PTA for atrial fibrillation. Last dose taken on 11/17. Pharmacy consulted to dose heparin.  aPTT 61 on heparin 1100 units/hr. Heparin level remains >1.1 given recent Eliquis use. CBC stable with no signs of bleeding.   Continue to monitor aPTT and heparin level until correlating.  11/22 AM update:  aPTT sub-therapeutic   Goal of Therapy:  Heparin level 0.3-0.7 units/ml aPTT 66-102 seconds Monitor platelets by anticoagulation protocol: Yes   Plan:  Inc heparin to 1550 units/hr Heparin level and aPTT in 8 hours Daily CBC and heparin level/aPTT until correlating [place], PharmD, [place] Pharmacist Phone: [phone]

## 2021-11-22 NOTE — Progress Notes (Signed)
[facility] Progress Note Patient Name: [name] [name] DOB: 1926-08-15 MRN: [id number]   Date of Service  11/22/2021  HPI/Events of Note  Anemia - Hgb = 6.5. Nursing reports blood tinged urine. Patient is on a Heparin IV infusion.   eICU Interventions  Plan: Trnasfuse 1 unit PRBC. UA with reflex microscopic. PT/INR now.  May need to hold Heparine IV infusion or at minimum run at low end of therapeutic if UA is positive for RBC.     Intervention Category Major Interventions: Other:  Camilo Mander Cornelia Copa 11/22/2021, 6:00 AM

## 2021-11-22 NOTE — Consult Note (Signed)
[facility] Nurse re-consult Note: WOC consult preformed 11/20; refer to progress notes for assessment and plan of care.  Requested today to assess right heel. Left heel was previously noted to have a Deep tissue pressure injury, present on admission, 5X5cm intact darker-colored skin.  Right heel with Unstageable pressure injury; 4X3cm, 90% loose eschar, 10% red, beginning to lift at the edges, small amt tan drainage. Pressure Injury POA: Both pressure injuries were noted as present on admission.  Dressing procedure/placement/frequency: Pt has foam dressings in place bilat to protect from further injury and Prevalon boots to reduce pressure. Topical treatment orders provided for bedside nurses to perform as follows to assist with removal of nonviable tissue: Apply Medihoney to right heel Q day, then cover with foam dressing.  Change foam dressing Q 3 days or PRN soiling. Please re-consult if further assistance is needed.  Thank-you,  Julien Girt MSN, [place], [place], [place], [place]

## 2021-11-22 NOTE — Progress Notes (Addendum)
Date and time results received: 11/22/21 05:25 AM  Test: Hgb Critical Value: 6.5  Name of Provider Notified: Dr Oletta Darter  Orders Received? Yes Or Actions Taken?: Transfused 1 unit PRBC

## 2021-11-22 NOTE — Progress Notes (Signed)
PROGRESS NOTE  [name] [name] HYQ:[id number] DOB: [birth year] DOA: 11/18/2021 PCP: [name], MD   LOS: 3 days   Brief Narrative / Interim history: 88 year old male with history of PVD status post femoropopliteal angioplasty and stenting on 11/6, bilateral foot wounds status post second ary amputation on 11/8, dementia, CAD, CKD 3B, who came into the hospital with septic shock due to MSSA bacteremia, initially admitted to the ICU and eventually transferred to the hospitalist service on 11/22.  He was also found to have a Pseudomonas UTI, acute kidney injury due to shock.  Subjective / 24h Interval events: Confused, wakes up when prompted but falls back asleep when not interacted with.  Assesement and Plan: Principal Problem:   Severe sepsis ([place]) Active Problems:   Sepsis secondary to UTI ([place])   CKD (chronic kidney disease) stage 3, GFR 30-59 ml/min (HCC)   AKI (acute kidney injury) ([place])   Dementia (HCC)   Acute metabolic encephalopathy   Skin ulcers of foot, bilateral (HCC)   CAD (coronary artery disease), native coronary artery   Paroxysmal atrial fibrillation (HCC)   Chronic diastolic CHF (congestive heart failure) (HCC)   Polymyalgia rheumatica (HCC)   History of CVA with residual deficit   Essential hypertension   OSA (obstructive sleep apnea)   Septic shock ([place])  Principal problem Septic shock due to MSSA bacteremia, Pseudomonas UTI-ID consulted and following.  His blood cultures were positive for MSSA.  Urine cultures positive for Pseudomonas as well as E. coli, pansensitive.  Currently on nafcillin per infectious disease. -2D echo done 11/20 shows an EF of 55 to 60%, RV was normal.  Active problems Acute CVA-MRI done 11/21 shows several areas of acute infarct in the right posterior frontal lobe, right parietal cortex and right posterior frontal lobe white matter.  Looks embolic in nature.  Discussed with neurology, Dr. Leonel Ramsay, and his CVA may be from his A-fib or  septic emboli given bacteremia.  For now he recommends holding anticoagulation for a week, and resume Eliquis on 11/29   AKI on CKD 3A-baseline creatinine 1.3-1.5, creatinine on presentation was 4.25.  Acute kidney injury is likely in the setting of ATN in settings of shock.  Creatinine gradually improving  PAF -resume metoprolol when able to take p.o., for now keep on IV metoprolol   AS status post TAVR-most recent 2D echo shows stability.  PVD-status post recent femoropopliteal stent placement on 11/6  CAD with history of CABG- appears stable  Acute metabolic encephalopathy with underlying baseline dementia - noted  PMR-he is on prednisone at home, came in with shock, probably has a degree of iatrogenic adrenal insufficiency.  Placed on IV steroids until he is able to reliably take p.o.  Anemia of acute illness, chronic kidney disease -hemoglobin down to 6.5 this morning, transfuse unit of packed blood cells.  No bleeding  Thrombocytopenia-likely in the setting of shock.  Monitor platelets  Bilateral foot wounds-status post left second ray amputation on 11/8  Scheduled Meds:  Chlorhexidine Gluconate Cloth  6 each Topical Daily   leptospermum manuka honey  1 Application Topical Daily   mouth rinse  15 mL Mouth Rinse 4 times per day   pantoprazole (PROTONIX) IV  40 mg Intravenous Daily   sodium chloride flush  10-40 mL Intracatheter Q12H   sodium chloride flush  10-40 mL Intracatheter Q12H   Continuous Infusions:  sodium chloride 10 mL/hr at 11/21/21 2200   sodium chloride Stopped (11/19/21 0808)   heparin 1,550 Units/hr (11/22/21 0604)  nafcillin 12 g in sodium chloride 0.9 % 500 mL continuous infusion     PRN Meds:.fentaNYL (SUBLIMAZE) injection, ipratropium-albuterol, ondansetron **OR** ondansetron (ZOFRAN) IV, mouth rinse, sodium chloride flush, sodium chloride flush  Current Outpatient Medications  Medication Instructions   acetaminophen (TYLENOL) 650 mg, Oral, Every 4  hours PRN   albuterol (PROVENTIL) 2.5 mg, Nebulization, Every 6 hours PRN   alum & mag hydroxide-simeth (MAALOX/MYLANTA) 200-200-20 MG/5ML suspension 30 mLs, Oral, Every 2 hours PRN   amiodarone (PACERONE) 200 mg, Oral, Daily   apixaban (ELIQUIS) 2.5 mg, Oral, 2 times daily   ascorbic acid (VITAMIN C) 500 mg, Oral, Daily   aspirin EC 81 mg, Oral, Daily, Swallow whole.   atorvastatin (LIPITOR) 40 mg, Oral, Daily   bisacodyl (DULCOLAX) 5 mg, Oral, Daily PRN   Cyanocobalamin 5,000 mcg, Sublingual, Daily   cyclobenzaprine (FLEXERIL) 5 mg, Oral, 3 times daily PRN   denosumab (PROLIA) 60 mg, Subcutaneous, Every 6 months   docusate sodium (COLACE) 100 mg, Oral, 2 times daily   EPINEPHrine (EPI-PEN) 0.3 mg, As needed   ezetimibe (ZETIA) 10 mg, Oral, Daily   feeding supplement, GLUCERNA SHAKE, (GLUCERNA SHAKE) LIQD 237 mLs, Oral, 3 times daily between meals   finasteride (PROSCAR) 5 mg, Oral, Daily   fish oil-omega-3 fatty acids 1 g, Oral, Daily   gabapentin (NEURONTIN) 100 MG capsule Take 2 capsules by mouth in the morning, 1 capsule in the evening, and 2 capsules at bedtime.   galantamine (RAZADYNE ER) 8 mg, Oral, Daily with breakfast   guaiFENesin-dextromethorphan (ROBITUSSIN DM) 100-10 MG/5ML syrup 15 mLs, Oral, Every 4 hours PRN   isosorbide mononitrate (IMDUR) 60 mg, Oral, Daily   leflunomide (ARAVA) 20 MG tablet Take 1/2 tablets (10 mg total) by mouth daily.   lidocaine (LIDODERM) 5 % 1 patch, Transdermal, Every 24 hours, Remove & Discard patch within 12 hours or as directed by MD   Magnesium Oxide 250 mg, Oral, Daily   memantine (NAMENDA) 10 mg, Oral, 2 times daily   metoprolol succinate (TOPROL-XL) 12.5 mg, Oral, Daily   nitroGLYCERIN (NITROSTAT) 0.4 mg, Sublingual, Every 5 min x3 PRN   nutrition supplement, JUVEN, (JUVEN) PACK 1 packet, Oral, 2 times daily between meals   pantoprazole (PROTONIX) 40 mg, Oral, Daily   polyethylene glycol (MIRALAX) 17 g, Oral, Daily   potassium chloride  (KLOR-CON M) 10 MEQ tablet Take 1 tablet by mouth everyday with furosemide   predniSONE (DELTASONE) 3 mg, Oral, Daily with breakfast   Psyllium Husk POWD 1 Scoop, Oral, Daily   tamsulosin (FLOMAX) 0.4 mg, Oral, Daily after supper   torsemide (DEMADEX) 40 mg, Oral, Daily   triamcinolone cream (KENALOG) 0.1 % 1 Application, Topical, As needed   vitamin D3 (CHOLECALCIFEROL) 1,000 Units, Oral, Daily   Zinc 50 mg, Oral, Daily    Diet Orders (From admission, onward)     Start     Ordered   11/19/21 0024  Diet NPO time specified  Diet effective now        11/19/21 0024            DVT prophylaxis: heparin on hold   Lab Results  Component Value Date   PLT 52 (L) 11/22/2021      Code Status: DNR  Family Communication: wife at bedside   Status is: Inpatient  Remains inpatient appropriate because: severity of illness  Level of care: Progressive  Consultants:  PCCM  Objective: Vitals:   11/22/21 4401 11/22/21 0272 11/22/21 0809 11/22/21 5366  BP:  116/62 128/65 131/71  Pulse: 65 66 70 74  Resp: 20 (!) 22 (!) 21 (!) 23  Temp:  (!) 97.5 F (36.4 C) (!) 97.3 F (36.3 C) (!) 97.3 F (36.3 C)  TempSrc:  Oral Oral Oral  SpO2: 100% 99% 99% 99%  Weight:      Height:        Intake/Output Summary (Last 24 hours) at 11/22/2021 1116 Last data filed at 11/22/2021 0948 Gross per 24 hour  Intake 912.98 ml  Output 1050 ml  Net -137.02 ml   Wt Readings from Last 3 Encounters:  11/20/21 98.5 kg  11/08/21 98 kg  06/13/21 98.4 kg    Examination:  Constitutional: NAD Eyes: no scleral icterus ENMT: Mucous membranes are moist.  Neck: normal, supple Respiratory: clear to auscultation bilaterally, no wheezing, no crackles. Cardiovascular: Regular rate and rhythm, no murmurs / rubs / gallops. No LE edema.  Abdomen: non distended, no tenderness. Bowel sounds positive.  Musculoskeletal: no clubbing / cyanosis.    Data Reviewed: I have independently reviewed following labs  and imaging studies   CBC Recent Labs  Lab 11/18/21 2029 11/18/21 2122 11/19/21 0525 11/20/21 0410 11/21/21 0343 11/22/21 0435  WBC 17.5*  --  18.1* 17.9* 9.7 6.3  HGB 7.8* 8.2* 7.0* 7.3* 7.2* 6.5*  HCT 25.6* 24.0* 23.1* 24.3* 22.2* 20.2*  PLT 171  --  110* 106* 67* 52*  MCV 90.8  --  92.0 92.4 88.1 89.8  MCH 27.7  --  27.9 27.8 28.6 28.9  MCHC 30.5  --  30.3 30.0 32.4 32.2  RDW 16.6*  --  16.6* 16.5* 17.2* 17.1*  LYMPHSABS 0.5*  --   --   --   --  0.6*  MONOABS 0.0*  --   --   --   --  0.5  EOSABS 0.0  --   --   --   --  0.1  BASOSABS 0.2*  --   --   --   --  0.1    Recent Labs  Lab 11/18/21 2029 11/18/21 2114 11/18/21 2122 11/18/21 2326 11/19/21 0433 11/19/21 1516 11/21/21 0343 11/22/21 0435  NA 133*  --  132*  --  135 134* 141  --   K 4.5  --  4.0  --  4.3 3.8 3.5  --   CL 95*  --   --   --  98 103 109  --   CO2 21*  --   --   --  17* 19* 17*  --   GLUCOSE 140*  --   --   --  137* 144* 91  --   BUN 86*  --   --   --  83* 81* 73*  --   CREATININE 4.25*  --   --   --  4.05* 3.70* 3.00*  --   CALCIUM 8.8*  --   --   --  8.4* 7.9* 8.2*  --   AST 79*  --   --   --  90* 84* 40  --   ALT 45*  --   --   --  44 40 18  --   ALKPHOS 278*  --   --   --  207* 209* 140*  --   BILITOT 1.2  --   --   --  1.0 0.8 1.3*  --   ALBUMIN 2.0*  --   --   --  1.6* 1.6* 1.9*  --  MG  --   --   --   --   --   --  1.9  --   PROCALCITON  --   --   --   --  112.32  --   --   --   LATICACIDVEN 3.1*  --   --  4.0* 3.4* 1.5  --   --   INR  --  1.7*  --   --  1.8*  --   --  1.3*    ------------------------------------------------------------------------------------------------------------------ No results for input(s): "CHOL", "HDL", "LDLCALC", "TRIG", "CHOLHDL", "LDLDIRECT" in the last 72 hours.  Lab Results  Component Value Date   HGBA1C 5.8 (H) 05/25/2021   ------------------------------------------------------------------------------------------------------------------ No results  for input(s): "TSH", "T4TOTAL", "T3FREE", "THYROIDAB" in the last 72 hours.  Invalid input(s): "FREET3"  Cardiac Enzymes No results for input(s): "CKMB", "TROPONINI", "MYOGLOBIN" in the last 168 hours.  Invalid input(s): "CK" ------------------------------------------------------------------------------------------------------------------    Component Value Date/Time   BNP 199.4 (H) 08/23/2020 0240    CBG: Recent Labs  Lab 11/19/21 1133  GLUCAP 143*    Recent Results (from the past 240 hour(s))  Blood Culture (routine x 2)     Status: Abnormal   Collection Time: 11/18/21  9:14 PM   Specimen: BLOOD  Result Value Ref Range Status   Specimen Description BLOOD LEFT ANTECUBITAL  Final   Special Requests   Final    BOTTLES DRAWN AEROBIC AND ANAEROBIC Blood Culture results may not be optimal due to an inadequate volume of blood received in culture bottles   Culture  Setup Time   Final    GRAM POSITIVE COCCI IN CLUSTERS IN BOTH AEROBIC AND ANAEROBIC BOTTLES Organism ID to follow CRITICAL RESULT CALLED TO, READ BACK BY AND VERIFIED WITH: [place] [phone] AT 1018 BY EC Performed at [facility], [place] [address], [place], [place] [address]    Culture STAPHYLOCOCCUS AUREUS (A)  Final   Report Status 11/21/2021 FINAL  Final   Organism ID, Bacteria STAPHYLOCOCCUS AUREUS  Final      Susceptibility   Staphylococcus aureus - MIC*    CIPROFLOXACIN <=0.5 SENSITIVE Sensitive     ERYTHROMYCIN <=0.25 SENSITIVE Sensitive     GENTAMICIN <=0.5 SENSITIVE Sensitive     OXACILLIN 0.5 SENSITIVE Sensitive     TETRACYCLINE <=1 SENSITIVE Sensitive     VANCOMYCIN 1 SENSITIVE Sensitive     TRIMETH/SULFA <=10 SENSITIVE Sensitive     CLINDAMYCIN <=0.25 SENSITIVE Sensitive     RIFAMPIN <=0.5 SENSITIVE Sensitive     Inducible Clindamycin NEGATIVE Sensitive     * STAPHYLOCOCCUS AUREUS  Blood Culture ID Panel (Reflexed)     Status: Abnormal   Collection Time: 11/18/21  9:14 PM  Result  Value Ref Range Status   Enterococcus faecalis NOT DETECTED NOT DETECTED Final   Enterococcus Faecium NOT DETECTED NOT DETECTED Final   Listeria monocytogenes NOT DETECTED NOT DETECTED Final   Staphylococcus species DETECTED (A) NOT DETECTED Final    Comment: CRITICAL RESULT CALLED TO, READ BACK BY AND VERIFIED WITH: PHARMD LAUREN BELL [phone] AT 1018 BY EC    Staphylococcus aureus (BCID) DETECTED (A) NOT DETECTED Final    Comment: CRITICAL RESULT CALLED TO, READ BACK BY AND VERIFIED WITH: PHARMD LAUREN BELL [phone] AT 1018 BY EC    Staphylococcus epidermidis NOT DETECTED NOT DETECTED Final   Staphylococcus lugdunensis NOT DETECTED NOT DETECTED Final   Streptococcus species NOT DETECTED NOT DETECTED Final   Streptococcus agalactiae NOT DETECTED  NOT DETECTED Final   Streptococcus pneumoniae NOT DETECTED NOT DETECTED Final   Streptococcus pyogenes NOT DETECTED NOT DETECTED Final   A.calcoaceticus-baumannii NOT DETECTED NOT DETECTED Final   Bacteroides fragilis NOT DETECTED NOT DETECTED Final   Enterobacterales NOT DETECTED NOT DETECTED Final   Enterobacter cloacae complex NOT DETECTED NOT DETECTED Final   Escherichia coli NOT DETECTED NOT DETECTED Final   Klebsiella aerogenes NOT DETECTED NOT DETECTED Final   Klebsiella oxytoca NOT DETECTED NOT DETECTED Final   Klebsiella pneumoniae NOT DETECTED NOT DETECTED Final   Proteus species NOT DETECTED NOT DETECTED Final   Salmonella species NOT DETECTED NOT DETECTED Final   Serratia marcescens NOT DETECTED NOT DETECTED Final   Haemophilus influenzae NOT DETECTED NOT DETECTED Final   Neisseria meningitidis NOT DETECTED NOT DETECTED Final   Pseudomonas aeruginosa NOT DETECTED NOT DETECTED Final   Stenotrophomonas maltophilia NOT DETECTED NOT DETECTED Final   Candida albicans NOT DETECTED NOT DETECTED Final   Candida auris NOT DETECTED NOT DETECTED Final   Candida glabrata NOT DETECTED NOT DETECTED Final   Candida krusei NOT DETECTED NOT  DETECTED Final   Candida parapsilosis NOT DETECTED NOT DETECTED Final   Candida tropicalis NOT DETECTED NOT DETECTED Final   Cryptococcus neoformans/gattii NOT DETECTED NOT DETECTED Final   Meth resistant mecA/C and MREJ NOT DETECTED NOT DETECTED Final    Comment: Performed at [facility], [address], [place], [place] [address]  Resp Panel by RT-PCR (Flu A&B, Covid) Anterior Nasal Swab     Status: None   Collection Time: 11/18/21  9:42 PM   Specimen: Anterior Nasal Swab  Result Value Ref Range Status   SARS Coronavirus 2 by RT PCR NEGATIVE NEGATIVE Final    Comment: (NOTE) SARS-CoV-2 target nucleic acids are NOT DETECTED.  The SARS-CoV-2 RNA is generally detectable in upper respiratory specimens during the acute phase of infection. The lowest concentration of SARS-CoV-2 viral copies this assay can detect is 138 copies/mL. A negative result does not preclude SARS-Cov-2 infection and should not be used as the sole basis for treatment or other patient management decisions. A negative result may occur with  improper specimen collection/handling, submission of specimen other than nasopharyngeal swab, presence of viral mutation(s) within the areas targeted by this assay, and inadequate number of viral copies(<138 copies/mL). A negative result must be combined with clinical observations, patient history, and epidemiological information. The expected result is Negative.  Fact Sheet for Patients:  EntrepreneurPulse.com.au  Fact Sheet for Healthcare Providers:  IncredibleEmployment.be  This test is no t yet approved or cleared by the [place] FDA and  has been authorized for detection and/or diagnosis of SARS-CoV-2 by FDA under an Emergency Use Authorization (EUA). This EUA will remain  in effect (meaning this test can be used) for the duration of the COVID-19 declaration under Section 564(b)(1) of the Act, 21 U.S.C.section  360bbb-3(b)(1), unless the authorization is terminated  or revoked sooner.       Influenza A by PCR NEGATIVE NEGATIVE Final   Influenza B by PCR NEGATIVE NEGATIVE Final    Comment: (NOTE) The Xpert Xpress SARS-CoV-2/FLU/RSV plus assay is intended as an aid in the diagnosis of influenza from Nasopharyngeal swab specimens and should not be used as a sole basis for treatment. Nasal washings and aspirates are unacceptable for Xpert Xpress SARS-CoV-2/FLU/RSV testing.  Fact Sheet for Patients: EntrepreneurPulse.com.au  Fact Sheet for Healthcare Providers: IncredibleEmployment.be  This test is not yet approved or cleared by the [place]  FDA and has been authorized for detection and/or diagnosis of SARS-CoV-2 by FDA under an Emergency Use Authorization (EUA). This EUA will remain in effect (meaning this test can be used) for the duration of the COVID-19 declaration under Section 564(b)(1) of the Act, 21 U.S.C. section 360bbb-3(b)(1), unless the authorization is terminated or revoked.  Performed at [facility], [place] [address], [place], [place] [address]   Urine Culture     Status: Abnormal   Collection Time: 11/18/21  9:53 PM   Specimen: In/Out Cath Urine  Result Value Ref Range Status   Specimen Description IN/OUT CATH URINE  Final   Special Requests   Final    NONE Performed at [facility], [place] [address] [place], [place] [address]    Culture (A)  Final    >=100,000 COLONIES/mL PSEUDOMONAS AERUGINOSA 20,000 COLONIES/mL ESCHERICHIA COLI    Report Status 11/21/2021 FINAL  Final   Organism ID, Bacteria PSEUDOMONAS AERUGINOSA (A)  Final   Organism ID, Bacteria ESCHERICHIA COLI (A)  Final      Susceptibility   Escherichia coli - MIC*    AMPICILLIN <=2 SENSITIVE Sensitive     CEFAZOLIN <=4 SENSITIVE Sensitive     CEFEPIME <=0.12 SENSITIVE Sensitive     CEFTRIAXONE <=0.25 SENSITIVE Sensitive     CIPROFLOXACIN <=0.25  SENSITIVE Sensitive     GENTAMICIN <=1 SENSITIVE Sensitive     IMIPENEM <=0.25 SENSITIVE Sensitive     NITROFURANTOIN <=16 SENSITIVE Sensitive     TRIMETH/SULFA <=20 SENSITIVE Sensitive     AMPICILLIN/SULBACTAM <=2 SENSITIVE Sensitive     PIP/TAZO <=4 SENSITIVE Sensitive     * 20,000 COLONIES/mL ESCHERICHIA COLI   Pseudomonas aeruginosa - MIC*    CEFTAZIDIME 2 SENSITIVE Sensitive     CIPROFLOXACIN 0.5 SENSITIVE Sensitive     GENTAMICIN <=1 SENSITIVE Sensitive     IMIPENEM >=16 RESISTANT Resistant     PIP/TAZO <=4 SENSITIVE Sensitive     CEFEPIME 2 SENSITIVE Sensitive     * >=100,000 COLONIES/mL PSEUDOMONAS AERUGINOSA  Blood Culture (routine x 2)     Status: None (Preliminary result)   Collection Time: 11/19/21  4:43 AM   Specimen: BLOOD RIGHT ARM  Result Value Ref Range Status   Specimen Description BLOOD RIGHT ARM  Final   Special Requests   Final    BOTTLES DRAWN AEROBIC ONLY Blood Culture adequate volume   Culture   Final    NO GROWTH 3 DAYS Performed at [facility], [address] [place], [place] [address]    Report Status PENDING  Incomplete  MRSA Next Gen by PCR, Nasal     Status: None   Collection Time: 11/19/21  7:47 PM   Specimen: Nasal Mucosa; Nasal Swab  Result Value Ref Range Status   MRSA by PCR Next Gen NOT DETECTED NOT DETECTED Final    Comment: (NOTE) The GeneXpert MRSA Assay (FDA approved for NASAL specimens only), is one component of a comprehensive MRSA colonization surveillance program. It is not intended to diagnose MRSA infection nor to guide or monitor treatment for MRSA infections. Test performance is not FDA approved in patients less than 41 years old. Performed at [facility], [place] [address] [place], [place] [address]   C Difficile Quick Screen w PCR reflex     Status: Abnormal   Collection Time: 11/20/21  4:35 AM   Specimen: STOOL  Result Value Ref Range Status   C Diff antigen POSITIVE (A) NEGATIVE  Final   C Diff toxin  NEGATIVE NEGATIVE Final   C Diff interpretation Results are indeterminate. See PCR results.  Final    Comment: Performed at [facility], [place] [address], [place], [place] [address]  C. Diff by PCR, Reflexed     Status: None   Collection Time: 11/20/21  4:35 AM  Result Value Ref Range Status   Toxigenic C. Difficile by PCR NEGATIVE NEGATIVE Final    Comment: Patient is colonized with non toxigenic C. difficile. May not need treatment unless significant symptoms are present. Performed at [facility], [place] [address], [place], [place] [address]   Culture, blood (Routine X 2) w Reflex to ID Panel     Status: Abnormal   Collection Time: 11/20/21  7:44 AM   Specimen: BLOOD RIGHT HAND  Result Value Ref Range Status   Specimen Description BLOOD RIGHT HAND  Final   Special Requests IN PEDIATRIC BOTTLE Blood Culture adequate volume  Final   Culture  Setup Time   Final    GRAM POSITIVE COCCI IN CLUSTERS IN PEDIATRIC BOTTLE CRITICAL VALUE NOTED.  VALUE IS CONSISTENT WITH PREVIOUSLY REPORTED AND CALLED VALUE.    Culture (A)  Final    STAPHYLOCOCCUS AUREUS SUSCEPTIBILITIES PERFORMED ON PREVIOUS CULTURE WITHIN THE LAST 5 DAYS. Performed at [facility], [place] [address], [place], [place] [address]    Report Status 11/22/2021 FINAL  Final  Culture, blood (Routine X 2) w Reflex to ID Panel     Status: Abnormal   Collection Time: 11/20/21  8:18 AM   Specimen: BLOOD RIGHT ARM  Result Value Ref Range Status   Specimen Description BLOOD RIGHT ARM  Final   Special Requests IN PEDIATRIC BOTTLE Blood Culture adequate volume  Final   Culture  Setup Time   Final    GRAM POSITIVE COCCI IN CLUSTERS IN PEDIATRIC BOTTLE Gram Stain Report Called to,Read Back By and Verified With: PHARMD ABBOTT 11/21/21 @ 0038 BY AB CRITICAL VALUE NOTED.  VALUE IS CONSISTENT WITH PREVIOUSLY REPORTED AND CALLED VALUE.    Culture (A)  Final    STAPHYLOCOCCUS AUREUS SUSCEPTIBILITIES PERFORMED ON PREVIOUS  CULTURE WITHIN THE LAST 5 DAYS. Performed at [facility], [place] [address], [place], [place] [address]    Report Status 11/21/2021 FINAL  Final     Radiology Studies: MR BRAIN WO CONTRAST  Result Date: 11/22/2021 CLINICAL DATA:  Altered mental status EXAM: MRI HEAD WITHOUT CONTRAST TECHNIQUE: Multiplanar, multiecho pulse sequences of the brain and surrounding structures were obtained without intravenous contrast. COMPARISON:  05/25/2021 FINDINGS: Evaluation is limited by motion artifact. Brain: Restricted diffusion with ADC correlate in the right posterior frontal lobe cortex (series 5, image 98-100), right parietal cortex (series 5, images 93-94), and right posterior frontal lobe white matter (series 5, images 92, 94-95), concerning for acute infarcts. Additional possible restricted diffusion in the right occipital lobe (series 5, image 75). No acute hemorrhage, mass, mass effect, or midline shift. Encephalomalacia in left frontal lobe and left temporoparietal region, likely sequela of remote infarcts. No definite hemosiderin deposition to suggest remote hemorrhage. No hydrocephalus or extra-axial collection. Vascular: Normal arterial flow voids. Skull and upper cervical spine: Motion limited. Grossly normal marrow signal. Sinuses/Orbits: Clear paranasal sinuses. Status post bilateral lens replacements. Other: None. IMPRESSION: Evaluation is limited by motion artifact. Within this limitation, there are several areas of acute infarct in the right posterior frontal lobe, right parietal cortex, and right posterior frontal lobe white  matter. Additional possible acute infarct in the right occipital lobe. These results will be called to the ordering clinician or representative by the Radiologist Assistant, and communication documented in the PACS or [employer]. Electronically Signed   By: Merilyn Baba M.D.   On: 11/22/2021 01:09   DG CHEST PORT 1 VIEW  Result Date: 11/21/2021 CLINICAL DATA:   PICC line placement EXAM: PORTABLE CHEST 1 VIEW COMPARISON:  11/18/2021 FINDINGS: Left PICC line in place with the tip in the SVC. Prior median sternotomy and valve replacement. Heart is normal size. Mild vascular congestion. Focal left lower lobe airspace opacity. Possible small bilateral effusions. IMPRESSION: Left PICC line tip in the SVC. Mild vascular congestion. Left lower lobe atelectasis or infiltrate. Suspect small bilateral effusions. Electronically Signed   By: Rolm Baptise M.D.   On: 11/21/2021 21:12   [place] EKG SITE RITE  Result Date: 11/21/2021 If Site Rite image not attached, placement could not be confirmed due to current cardiac rhythm.    Marzetta Board, MD, PhD [facility]  Between 7 am - 7 pm I am available, please contact me via Amion (for emergencies) or Securechat (non urgent messages)  Between 7 pm - 7 am I am not available, please contact night coverage MD/APP via Amion

## 2021-11-22 NOTE — Progress Notes (Signed)
[facility] for Infectious Disease    Date of Admission:  11/18/2021   Total days of antibiotics 5          ID: [name] [name] is a 88 y.o. male with  MSSA bacteremia with likely endocarditis with CNS septic emboli Principal Problem:   Severe sepsis ([place]) Active Problems:   CAD (coronary artery disease), native coronary artery   Essential hypertension   Paroxysmal atrial fibrillation (HCC)   OSA (obstructive sleep apnea)   Chronic diastolic CHF (congestive heart failure) (HCC)   CKD (chronic kidney disease) stage 3, GFR 30-59 ml/min (HCC)   AKI (acute kidney injury) (HCC)   Dementia (HCC)   Polymyalgia rheumatica (HCC)   Skin ulcers of foot, bilateral (HCC)   History of CVA with residual deficit   Acute metabolic encephalopathy   Sepsis secondary to UTI ([place])   Septic shock (HCC)    Subjective: Afebrile but still intermittently confused per his wife. Not at his baseline.  He underwent brain mri non con which found several cns infarct They had to place picc line due to loss of iv access lastnight  Medications:   Chlorhexidine Gluconate Cloth  6 each Topical Daily   leptospermum manuka honey  1 Application Topical Daily   methylPREDNISolone (SOLU-MEDROL) injection  40 mg Intravenous Daily   metoprolol tartrate  2.5 mg Intravenous Q6H   mouth rinse  15 mL Mouth Rinse 4 times per day   pantoprazole (PROTONIX) IV  40 mg Intravenous Daily   sodium chloride flush  10-40 mL Intracatheter Q12H   sodium chloride flush  10-40 mL Intracatheter Q12H    Objective: Vital signs in last 24 hours: Temp:  [97.3 F (36.3 C)-99.2 F (37.3 C)] 97.4 F (36.3 C) (11/22 1555) Pulse Rate:  [64-79] 69 (11/22 1555) Resp:  [15-24] 22 (11/22 1555) BP: (109-131)/(50-75) 109/75 (11/22 1555) SpO2:  [96 %-100 %] 97 % (11/22 1555) Physical Exam  Constitutional: eyes opened. Not following instructions. He appears well-developed and well-nourished. No distress.  HENT:  Mouth/Throat:  Oropharynx is clear and moist. No oropharyngeal exudate.  Cardiovascular: Normal rate, regular rhythm and normal heart sounds. Exam reveals no gallop and no friction rub.  No murmur heard.  Pulmonary/Chest: Effort normal and breath sounds normal. No respiratory distress. He has no wheezes.  Abdominal: Soft. Bowel sounds are normal. He exhibits no distension. There is no tenderness.  Lymphadenopathy:  He has no cervical adenopathy.  Neurological: He is alert and oriented to person, place, and time.  Skin: Skin is warm and dry. No rash noted. No erythema.  Psychiatric: He has a normal mood and affect. His behavior is normal.    Lab Results Recent Labs    11/21/21 0343 11/22/21 0435  WBC 9.7 6.3  HGB 7.2* 6.5*  HCT 22.2* 20.2*  NA 141  --   K 3.5  --   CL 109  --   CO2 17*  --   BUN 73*  --   CREATININE 3.00*  --    Liver Panel Recent Labs    11/21/21 0343  PROT 5.4*  ALBUMIN 1.9*  AST 40  ALT 18  ALKPHOS 140*  BILITOT 1.3*    Microbiology: 11/20 blood cx +MSSA 11/18 blood cx + MSSA Studies/Results: MR BRAIN WO CONTRAST  Result Date: 11/22/2021 CLINICAL DATA:  Altered mental status EXAM: MRI HEAD WITHOUT CONTRAST TECHNIQUE: Multiplanar, multiecho pulse sequences of the brain and surrounding structures were obtained without intravenous contrast. COMPARISON:  05/25/2021 FINDINGS: Evaluation is limited by motion artifact. Brain: Restricted diffusion with ADC correlate in the right posterior frontal lobe cortex (series 5, image 98-100), right parietal cortex (series 5, images 93-94), and right posterior frontal lobe white matter (series 5, images 92, 94-95), concerning for acute infarcts. Additional possible restricted diffusion in the right occipital lobe (series 5, image 75). No acute hemorrhage, mass, mass effect, or midline shift. Encephalomalacia in left frontal lobe and left temporoparietal region, likely sequela of remote infarcts. No definite hemosiderin deposition to  suggest remote hemorrhage. No hydrocephalus or extra-axial collection. Vascular: Normal arterial flow voids. Skull and upper cervical spine: Motion limited. Grossly normal marrow signal. Sinuses/Orbits: Clear paranasal sinuses. Status post bilateral lens replacements. Other: None. IMPRESSION: Evaluation is limited by motion artifact. Within this limitation, there are several areas of acute infarct in the right posterior frontal lobe, right parietal cortex, and right posterior frontal lobe white matter. Additional possible acute infarct in the right occipital lobe. These results will be called to the ordering clinician or representative by the Radiologist Assistant, and communication documented in the PACS or [employer]. Electronically Signed   By: Merilyn Baba M.D.   On: 11/22/2021 01:09   DG CHEST PORT 1 VIEW  Result Date: 11/21/2021 CLINICAL DATA:  PICC line placement EXAM: PORTABLE CHEST 1 VIEW COMPARISON:  11/18/2021 FINDINGS: Left PICC line in place with the tip in the SVC. Prior median sternotomy and valve replacement. Heart is normal size. Mild vascular congestion. Focal left lower lobe airspace opacity. Possible small bilateral effusions. IMPRESSION: Left PICC line tip in the SVC. Mild vascular congestion. Left lower lobe atelectasis or infiltrate. Suspect small bilateral effusions. Electronically Signed   By: Rolm Baptise M.D.   On: 11/21/2021 21:12   [place] EKG SITE RITE  Result Date: 11/21/2021 If Site Rite image not attached, placement could not be confirmed due to current cardiac rhythm.    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 Will repeat blood cx today to see he is documenting clearance of bacteremia  Pseudomonas uti = received short course but suspect his main infection is MSSA bacteremia  Decub ulcer to right heel = follow wound care  Multiple cns stroke -- too high risk for TEE to  evaluate TAVR  Discussed with wife that this situation is very complicated and poor prognosis that she is not sure this is what he would want in terms of QoL given how debilitated he was coming into hospital. She has spoken to hospice care. Still trying to get concensus with his 4 adult children.  I have personally spent 50 minutes involved in face-to-face and non-face-to-face activities for this patient on the day of the visit. Professional time spent includes the following activities: Preparing to see the patient (review of tests), Obtaining and/or reviewing separately obtained history (admission/discharge record), Performing a medically appropriate examination and/or evaluation , Ordering medications/tests/procedures, referring and communicating with other health care professionals, Documenting clinical information in the EMR, Independently interpreting results (not separately reported), Communicating results to the patient/family/caregiver, Counseling and educating the patient/family/caregiver and Care coordination (not separately reported).    [facility] Pager: [phone]  11/22/2021, 4:16 PM

## 2021-11-22 NOTE — Consult Note (Signed)
Consultation Note Date: 11/22/2021   Patient Name: [name] [name]  DOB: 1926/07/29  MRN: [id number]  Age / Sex: 88 y.o., male  PCP: [name], MD Referring Physician: [name], MD  Reason for Consultation: Establishing goals of care  HPI/Patient Profile: 88 y.o. male  with past medical history of PVD status post femoropopliteal angioplasty and stenting on 11/6, bilateral foot wounds status post second ray amputation on 11/8, dementia, CAD, CKD 3B, htn, hld, PMR, and cva admitted on 11/18/2021 with septic shock due to MSSA bacteremia. Also found to have Pseudomonas UTI.  On 1121 patient had an MRI that revealed acute CVA.  Thought to be septic emboli.  PMT consulted to discuss goals of care.  Clinical Assessment and Goals of Care: I have reviewed medical records including EPIC notes, labs and imaging, received report from RN, assessed the patient and then met with patient's wife [name] to discuss diagnosis prognosis, GOC, EOL wishes, disposition and options.  I introduced Palliative Medicine as specialized medical care for people living with serious illness. It focuses on providing relief from the symptoms and stress of a serious illness. The goal is to improve quality of life for both the patient and the family.  We discussed a brief life review of the patient.  [name] tells me she and the patient were both previously married and their spouses passed away.  [name] met in hospice bereavement counseling following the death of their spouses.  [name] tells me they have been married for 19 years.  [name] has 4 children from his previous marriage -2 of them live out of state.  As far as functional and nutritional status [name] tells me [name] has had a functional decline over the past several years.  She tells me he has been wheelchair-bound for the last year and a half.  She tells me for the past few months he is lost his ability to transfer  himself out of his wheelchair.  She tells me they have an aide in the home that helps care for [name].  She tells me his appetite has remained good.  We discussed his cognitive status which she reports is pretty good though he has lost some short-term memory but his long-term memory has stayed intact.  We discussed patient's recent stay at [facility] rehab following surgery on his foot.  She tells me of his decline while he was there.   We discussed patient's current illness and what it means in the larger context of patient's on-going co-morbidities.  Natural disease trajectory and expectations at EOL were discussed.  We discussed patient's bacteremia as well as stroke.  We discussed his failure to thrive.  I attempted to elicit values and goals of care important to the patient.    The difference between aggressive medical intervention and comfort care was considered in light of the patient's goals of care.   [name] shares she feels a transition to comfort focused care and hospice would be in line with patient's wishes.  However she does express some hesitancy to do this yet as she would like to include patient's children and this decision.  She tells me 2 of the children are out of state and currently have other family members in town due to the holiday so does not good timing to have these discussions.  She requests a family meeting Monday evening or Tuesday morning to discuss how to move forward.  [name] has already spoken to hospice agency.  She requests [name]  eventually go to [facility] once all of his family agrees to this.  We discussed hospice philosophy of care and type of care provided.  For now, [name] would like to continue current measures including IV antibiotics until all of [name]'s children agree to comfort measures only.  We discussed that the topic of artificial nutrition may come up as [name] is not eating.  She tells me she would NOT pursue a feeding tube.  Discussed with [name] the  importance of continued conversation with family and the medical providers regarding overall plan of care and treatment options, ensuring decisions are within the context of the patients values and GOCs.    Questions and concerns were addressed. The family was encouraged to call with questions or concerns.  Primary Decision Maker NEXT OF [name]    SUMMARY OF RECOMMENDATIONS   -[name] likely to transition patient to comfort measures and [facility] however she does ask that we hold off on this transition until she is able to include [name]'s 4 adult children - (they are not available until Monday evening/Tuesday morning as they are out of state) - continue current measures until we can meet with children -PMT will touch base with [name] Monday about scheduling a meeting - she also asks that hospice liaison be present at meeting - if appetite does not improve [name] does NOT want artificial nutrition initiated - no BM in a couple of days, getting opioids - add senna qhs - [name] has our contact info and will call with any needs in the interim  Code Status/Advance Care Planning: DNR   Symptom Management:  Prn fentanyl for back pain  Discharge Planning: Hospice facility      Primary Diagnoses: Present on Admission:  Skin ulcers of foot, bilateral (HCC)  CKD (chronic kidney disease) stage 3, GFR 30-59 ml/min (HCC)  CAD (coronary artery disease), native coronary artery  Essential hypertension  Paroxysmal atrial fibrillation (HCC)  Dementia (HCC)  OSA (obstructive sleep apnea)  Chronic diastolic CHF (congestive heart failure) ([place])  Acute metabolic encephalopathy  AKI (acute kidney injury) ([place])  Polymyalgia rheumatica ([place])  Severe sepsis ([place])  Sepsis secondary to UTI ([place])  Septic shock ([place])   I have reviewed the medical record, interviewed the patient and family, and examined the patient. The following aspects are pertinent.  Past Medical History:  Diagnosis Date    AAA (abdominal aortic aneurysm) (HCC)    Arthritis    Ascending aorta dilatation ([place]) 03/13/2012   Fusiform dilatation of the ascending thoracic aorta discovered during surgery, max diameter 4.2-4.3 cm   BPH (benign prostatic hypertrophy)    Chronic systolic heart failure ([place]) 03/03/2012   Chronic venous insufficiency    GERD (gastroesophageal reflux disease)    Glaucoma    Gout    History of kidney stones    Hyperlipidemia    Hypertension    Obesity (BMI 30-39.9)    Paroxysmal atrial fibrillation (HCC) 02/29/2012   Recurrent paroxysmal, new-onset    PMR (polymyalgia rheumatica) (HCC)    Polymyalgia rheumatica ([place]) 02/06/2019   Pre-diabetes    S/P CABG x 3 03/06/2012   LIMA to LAD, SVG to OM, SVG to RCA, EVH via right thigh   S/P Maze operation for atrial fibrillation 03/06/2012   Complete bilateral lesions set using bipolar radiofrequency and cryothermy ablation with clipping of LA appendage   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   s/p TAVR with a 26 mm Edwards via the left subclavian by Drs  McAlhnay and Bartle   Severe aortic stenosis 09/29/2019   Sleep apnea    USES CPAP NIGHTLY   Social History   Socioeconomic History   Marital status: Married    Spouse name: Not on file   Number of children: 4   Years of education: Not on file   Highest education level: Not on file  Occupational History   Occupation: retired    Comment: [employer]  Tobacco Use   Smoking status: Former    Packs/day: 0.50    Years: 15.00    Total pack years: 7.50    Types: Cigarettes    Quit date: 11/18/1962    Years since quitting: 59.0    Passive exposure: Never   Smokeless tobacco: Never  Vaping Use   Vaping Use: Never used  Substance and Sexual Activity   Alcohol use: Yes    Alcohol/week: 2.0 standard drinks of alcohol    Types: 2 Shots of liquor per week    Comment: daily, not in last few days   Drug use: Never   Sexual activity: Yes  Other Topics  Concern   Not on file  Social History Narrative   Lives with wife in [place].     Very active physically without significant limitations.    Exercises regularly   Social Determinants of Health   Financial Resource Strain: Not on file  Food Insecurity: No Food Insecurity (11/14/2021)   Hunger Vital Sign    Worried About Running Out of Food in the Last Year: Never true    Ran Out of Food in the Last Year: Never true  Transportation Needs: No Transportation Needs (11/14/2021)   PRAPARE - Hydrologist (Medical): No    Lack of Transportation (Non-Medical): No  Physical Activity: Not on file  Stress: Not on file  Social Connections: Not on file   Family History  Problem Relation Age of Onset   Dementia Mother    Stroke Brother    Scheduled Meds:  Chlorhexidine Gluconate Cloth  6 each Topical Daily   leptospermum manuka honey  1 Application Topical Daily   methylPREDNISolone (SOLU-MEDROL) injection  40 mg Intravenous Daily   metoprolol tartrate  2.5 mg Intravenous Q6H   mouth rinse  15 mL Mouth Rinse 4 times per day   pantoprazole (PROTONIX) IV  40 mg Intravenous Daily   sodium chloride flush  10-40 mL Intracatheter Q12H   sodium chloride flush  10-40 mL Intracatheter Q12H   Continuous Infusions:  sodium chloride 10 mL/hr at 11/21/21 2200   sodium chloride Stopped (11/19/21 0808)   nafcillin 12 g in sodium chloride 0.9 % 500 mL continuous infusion 12 g (11/22/21 1123)   PRN Meds:.fentaNYL (SUBLIMAZE) injection, ipratropium-albuterol, ondansetron **OR** ondansetron (ZOFRAN) IV, mouth rinse, sodium chloride flush, sodium chloride flush Allergies  Allergen Reactions   Amoxicillin Other (See Comments)    Headache, debilitated   Bee Venom Anaphylaxis   Avelox [Moxifloxacin] Other (See Comments)    Unknown   Codeine Other (See Comments)    Unknown    Duloxetine Other (See Comments)    Unknown   Duloxetine Hcl Other (See Comments)    felt weird    Levitra [Vardenafil] Other (See Comments)    Unknown   Morphine And Related Other (See Comments)    hypotension   Penicillin G Sodium Other (See Comments)    Severe headache, fatigue "debilitated"   Testosterone Other (See Comments)    Unknown  Tizanidine Other (See Comments)    Unknown     Viagra [Sildenafil] Other (See Comments)    didn't like it   Review of Systems  Unable to perform ROS: Mental status change    Physical Exam Constitutional:      General: He is not in acute distress.    Appearance: He is ill-appearing.     Comments: Does not respond to voice or touch, occasionally moans  Pulmonary:     Effort: Pulmonary effort is normal.  Skin:    General: Skin is warm and dry.     Vital Signs: BP 109/75 (BP Location: Right Arm)   Pulse 69   Temp (!) 97.4 F (36.3 C) (Oral)   Resp (!) 22   Ht _0  (1.854 m)   Wt 98.5 kg   SpO2 97%   BMI 28.65 kg/m  Pain Scale: Faces   Pain Score: Asleep   SpO2: SpO2: 97 % O2 Device:SpO2: 97 % O2 Flow Rate: .O2 Flow Rate (L/min): 5 L/min  IO: Intake/output summary:  Intake/Output Summary (Last 24 hours) at 11/22/2021 1657 Last data filed at 11/22/2021 1500 Gross per 24 hour  Intake 826.52 ml  Output 250 ml  Net 576.52 ml    LBM: Last BM Date : 11/20/21 Baseline Weight: Weight: 93.4 kg Most recent weight: Weight: 98.5 kg     Palliative Assessment/Data: PPS 10%     *Please note that this is a verbal dictation therefore any spelling or grammatical errors are due to the "[place] One" system interpretation.  Juel Burrow, DNP, AGNP-C Palliative Medicine Team [phone] Pager: [phone]

## 2021-11-22 NOTE — Progress Notes (Signed)
Speech Language Pathology Treatment: Dysphagia  Patient Details Name: [name] [name] MRN: [id number] DOB: Aug 29, 1926 Today's Date: 11/22/2021 Time: 1010-1025 SLP Time Calculation (min) (ACUTE ONLY): 15 min  Assessment / Plan / Recommendation Clinical Impression  Patient seen for diagnostic treatment following initial evaluation on previous date. Overall, presentation similar to that during evaluation with eyes remaining closed for much of session, opening with moderate-max auditory cueing. Speech incoherent, dysarthric, improving following oral care. Note that palate is coated with thick, dried, dark secretions with uvula curled up and stuck to top of soft palate. Dark spots noted bilaterally on tongue. Patient somewhat resistant to oral care but allowed clinician to complete with moderate encouragement prior to po intake to decrease risk of an aspiration related infection. With pos (ice chips and small tsp boluses of water), he continued to present with decreased labial seal with resultant anterior labial spillage of bolus and both throat clearing and weak cough post swallow indicative of decreased airway protection. At this time, aspiration risk high with pos given poor alertness, AMS, and obviously poor management of secretions with evidence of decreased airway protection. Recommend 2-3 ice chips following thorough oral care in attempts to loosen dried secretions, hydrate mucosa, and decrease muscle atrophy. Hopeful that along with improved mentation, ability to tolerate pos will improve. Will continue to f/u at bedside.    HPI HPI: Pt is a 88 y.o. male who presented to [facility] ER on 11/18 with reports of AMS & fever from SNF. Admitted with septic shock secondary to Staph Bacteremia, Psa UTI.  Recent admission from 11/2-11/4 for peripheral vascular disease s/p femoral-popliteal angioplasty and stenting on 11/6 and bilateral foot wounds with s/p left second ray amputation per Dr. Sharol Given on 11/8. CXR (11/18)-  neg. PMH: dementia, CAD s/p CABG, PVD, HTN, HLD wheelchair-bound at baseline, aphasia post CVA (seeing OP SLP services just PTA).      SLP Plan  Continue with current plan of care      Recommendations for follow up therapy are one component of a multi-disciplinary discharge planning process, led by the attending physician.  Recommendations may be updated based on patient status, additional functional criteria and insurance authorization.    Recommendations  Diet recommendations: NPO (allow ice chips after thorough oral care if alert) Medication Administration: Via alternative means               Oral Care Recommendations: Oral care QID;Oral care prior to ice chip/H20 SLP Visit Diagnosis: Dysphagia, oropharyngeal phase (R13.12) Plan: Continue with current plan of care         [facility] MA, [place]  11/22/2021, 10:26 AM

## 2021-11-23 ENCOUNTER — Inpatient Hospital Stay (HOSPITAL_COMMUNITY): Payer: Medicare Other

## 2021-11-23 DIAGNOSIS — R6520 Severe sepsis without septic shock: Secondary | ICD-10-CM | POA: Diagnosis not present

## 2021-11-23 DIAGNOSIS — A419 Sepsis, unspecified organism: Secondary | ICD-10-CM | POA: Diagnosis not present

## 2021-11-23 LAB — COMPREHENSIVE METABOLIC PANEL
ALT: 12 U/L (ref 0–44)
AST: 24 U/L (ref 15–41)
Albumin: 1.8 g/dL — ABNORMAL LOW (ref 3.5–5.0)
Alkaline Phosphatase: 138 U/L — ABNORMAL HIGH (ref 38–126)
Anion gap: 13 (ref 5–15)
BUN: 70 mg/dL — ABNORMAL HIGH (ref 8–23)
CO2: 17 mmol/L — ABNORMAL LOW (ref 22–32)
Calcium: 9 mg/dL (ref 8.9–10.3)
Chloride: 119 mmol/L — ABNORMAL HIGH (ref 98–111)
Creatinine, Ser: 2.82 mg/dL — ABNORMAL HIGH (ref 0.61–1.24)
GFR, Estimated: 20 mL/min — ABNORMAL LOW (ref >60)
Glucose, Bld: 180 mg/dL — ABNORMAL HIGH (ref 70–99)
Potassium: 3.2 mmol/L — ABNORMAL LOW (ref 3.5–5.1)
Sodium: 149 mmol/L — ABNORMAL HIGH (ref 135–145)
Total Bilirubin: 2.9 mg/dL — ABNORMAL HIGH (ref 0.3–1.2)
Total Protein: 5.7 g/dL — ABNORMAL LOW (ref 6.5–8.1)

## 2021-11-23 LAB — CBC
HCT: 25.7 % — ABNORMAL LOW (ref 39.0–52.0)
Hemoglobin: 8 g/dL — ABNORMAL LOW (ref 13.0–17.0)
MCH: 28.2 pg (ref 26.0–34.0)
MCHC: 31.1 g/dL (ref 30.0–36.0)
MCV: 90.5 fL (ref 80.0–100.0)
Platelets: 42 10*3/uL — ABNORMAL LOW (ref 150–400)
RBC: 2.84 MIL/uL — ABNORMAL LOW (ref 4.22–5.81)
RDW: 16.7 % — ABNORMAL HIGH (ref 11.5–15.5)
WBC: 5.3 10*3/uL (ref 4.0–10.5)
nRBC: 0.4 % — ABNORMAL HIGH (ref 0.0–0.2)

## 2021-11-23 LAB — BPAM RBC
Blood Product Expiration Date: 202312112359
ISSUE DATE / TIME: 202311220618
Unit Type and Rh: 5100

## 2021-11-23 LAB — MAGNESIUM: Magnesium: 2.1 mg/dL (ref 1.7–2.4)

## 2021-11-23 LAB — TYPE AND SCREEN
ABO/RH(D): O POS
Antibody Screen: NEGATIVE
Unit division: 0

## 2021-11-23 MED ORDER — POTASSIUM CHLORIDE 10 MEQ/100ML IV SOLN
10.0000 meq | INTRAVENOUS | Status: AC
  Administered 2021-11-23 (×3): 10 meq via INTRAVENOUS
  Filled 2021-11-23 (×3): qty 100

## 2021-11-23 MED ORDER — DEXTROSE 5 % IV SOLN: INTRAVENOUS | Status: DC

## 2021-11-23 NOTE — Progress Notes (Signed)
[facility]) Hospital Liaison Note  Hospital liaison Nicholaus Corolla had informational visit with wife on 11.21.2023 after wife called [facility] to request hospice services. Wife accepted information and stated she would follow up if services desired.  Continuing to follow peripherally in the chart and note Kathie Rhodes consult from yesterday. Family meeting with hospital liaison requested to join planned for Monday evening/Tuesday morning when adult children can be present after holiday. [place] liaisons will continue to follow along in the chart and available to coordinate with PMT for family meeting early next week.  Please call with any hospice related questions or concerns.  Thank you, Margaretmary Eddy, BSN, RN [facility] Liaison [phone]

## 2021-11-23 NOTE — Progress Notes (Signed)
PROGRESS NOTE  [name] [name] ULA:[id number] DOB: 1926-02-08 DOA: 11/18/2021 PCP: [name], MD   LOS: 4 days   Brief Narrative / Interim history: 88 year old male with history of PVD status post femoropopliteal angioplasty and stenting on 11/6, bilateral foot wounds status post second ary amputation on 11/8, dementia, CAD, CKD 3B, who came into the hospital with septic shock due to MSSA bacteremia, initially admitted to the ICU and eventually transferred to the hospitalist service on 11/22.  He was also found to have a Pseudomonas UTI, acute kidney injury due to shock.  Subjective / 24h Interval events: Remains confused, minimally interactive  Assesement and Plan: Principal Problem:   Severe sepsis ([place]) Active Problems:   Sepsis secondary to UTI ([place])   CKD (chronic kidney disease) stage 3, GFR 30-59 ml/min (HCC)   AKI (acute kidney injury) ([place])   Dementia (HCC)   Acute metabolic encephalopathy   Skin ulcers of foot, bilateral (HCC)   CAD (coronary artery disease), native coronary artery   Paroxysmal atrial fibrillation (HCC)   Chronic diastolic CHF (congestive heart failure) (HCC)   Polymyalgia rheumatica (HCC)   History of CVA with residual deficit   Essential hypertension   OSA (obstructive sleep apnea)   Septic shock ([place])  Principal problem Septic shock due to MSSA bacteremia, Pseudomonas UTI-ID consulted and following.  His blood cultures were positive for MSSA.  Urine cultures positive for Pseudomonas as well as E. coli, pansensitive.  Currently on nafcillin per infectious disease. -2D echo done 11/20 shows an EF of 55 to 60%, RV was normal.  Active problems Goals of care-patient with extremely poor prognosis given septic shock, new embolic strokes, worsening renal failure, worsening mental status, hyponatremia, poor p.o. intake.  Discussed with the wife, wishes for extended family to be available and potentially have a meeting.  Acute CVA-MRI done 11/21 shows  several areas of acute infarct in the right posterior frontal lobe, right parietal cortex and right posterior frontal lobe white matter.  Looks embolic in nature.  Discussed with neurology, Dr. Leonel Ramsay, and his CVA may be from his A-fib or septic emboli given bacteremia.  For now he recommends holding anticoagulation for a week, and resume Eliquis on 11/29   AKI on CKD 3A-baseline creatinine 1.3-1.5, creatinine on presentation was 4.25.  Acute kidney injury is likely in the setting of ATN in settings of shock.  Creatinine gradually improving  PAF -resume metoprolol when able to take p.o., for now keep on IV metoprolol   AS status post TAVR-most recent 2D echo shows stability.  Hypernatremia-place on D5W  PVD-status post recent femoropopliteal stent placement on 11/6  CAD with history of CABG- appears stable  Acute metabolic encephalopathy with underlying baseline dementia - noted  PMR-he is on prednisone at home, came in with shock, probably has a degree of iatrogenic adrenal insufficiency.  Placed on IV steroids until he is able to reliably take p.o.  Anemia of acute illness, chronic kidney disease -hemoglobin down to 6.5 on 11/22, transfuse unit of packed blood cells.  No bleeding.  Hemoglobin improved appropriately this morning  Thrombocytopenia-likely in the setting of shock.  Monitor platelets, if they continue to drop  Bilateral foot wounds-status post left second ray amputation on 11/8  Scheduled Meds:  Chlorhexidine Gluconate Cloth  6 each Topical Daily   leptospermum manuka honey  1 Application Topical Daily   methylPREDNISolone (SOLU-MEDROL) injection  40 mg Intravenous Daily   metoprolol tartrate  2.5 mg Intravenous Q6H   mouth  rinse  15 mL Mouth Rinse 4 times per day   pantoprazole (PROTONIX) IV  40 mg Intravenous Daily   senna  1 tablet Oral QHS   sodium chloride flush  10-40 mL Intracatheter Q12H   sodium chloride flush  10-40 mL Intracatheter Q12H   Continuous  Infusions:  sodium chloride 10 mL/hr at 11/21/21 2200   sodium chloride Stopped (11/19/21 0808)   nafcillin 12 g in sodium chloride 0.9 % 500 mL continuous infusion 20.8 mL/hr at 11/23/21 0450   PRN Meds:.fentaNYL (SUBLIMAZE) injection, ipratropium-albuterol, ondansetron **OR** ondansetron (ZOFRAN) IV, mouth rinse, sodium chloride flush, sodium chloride flush  Current Outpatient Medications  Medication Instructions   acetaminophen (TYLENOL) 650 mg, Oral, Every 4 hours PRN   albuterol (PROVENTIL) 2.5 mg, Nebulization, Every 6 hours PRN   alum & mag hydroxide-simeth (MAALOX/MYLANTA) 200-200-20 MG/5ML suspension 30 mLs, Oral, Every 2 hours PRN   amiodarone (PACERONE) 200 mg, Oral, Daily   apixaban (ELIQUIS) 2.5 mg, Oral, 2 times daily   ascorbic acid (VITAMIN C) 500 mg, Oral, Daily   aspirin EC 81 mg, Oral, Daily, Swallow whole.   atorvastatin (LIPITOR) 40 mg, Oral, Daily   bisacodyl (DULCOLAX) 5 mg, Oral, Daily PRN   Cyanocobalamin 5,000 mcg, Sublingual, Daily   cyclobenzaprine (FLEXERIL) 5 mg, Oral, 3 times daily PRN   denosumab (PROLIA) 60 mg, Subcutaneous, Every 6 months   docusate sodium (COLACE) 100 mg, Oral, 2 times daily   EPINEPHrine (EPI-PEN) 0.3 mg, As needed   ezetimibe (ZETIA) 10 mg, Oral, Daily   feeding supplement, GLUCERNA SHAKE, (GLUCERNA SHAKE) LIQD 237 mLs, Oral, 3 times daily between meals   finasteride (PROSCAR) 5 mg, Oral, Daily   fish oil-omega-3 fatty acids 1 g, Oral, Daily   gabapentin (NEURONTIN) 100 MG capsule Take 2 capsules by mouth in the morning, 1 capsule in the evening, and 2 capsules at bedtime.   galantamine (RAZADYNE ER) 8 mg, Oral, Daily with breakfast   guaiFENesin-dextromethorphan (ROBITUSSIN DM) 100-10 MG/5ML syrup 15 mLs, Oral, Every 4 hours PRN   isosorbide mononitrate (IMDUR) 60 mg, Oral, Daily   leflunomide (ARAVA) 20 MG tablet Take 1/2 tablets (10 mg total) by mouth daily.   lidocaine (LIDODERM) 5 % 1 patch, Transdermal, Every 24 hours, Remove  & Discard patch within 12 hours or as directed by MD   Magnesium Oxide 250 mg, Oral, Daily   memantine (NAMENDA) 10 mg, Oral, 2 times daily   metoprolol succinate (TOPROL-XL) 12.5 mg, Oral, Daily   nitroGLYCERIN (NITROSTAT) 0.4 mg, Sublingual, Every 5 min x3 PRN   nutrition supplement, JUVEN, (JUVEN) PACK 1 packet, Oral, 2 times daily between meals   pantoprazole (PROTONIX) 40 mg, Oral, Daily   polyethylene glycol (MIRALAX) 17 g, Oral, Daily   potassium chloride (KLOR-CON M) 10 MEQ tablet Take 1 tablet by mouth everyday with furosemide   predniSONE (DELTASONE) 3 mg, Oral, Daily with breakfast   Psyllium Husk POWD 1 Scoop, Oral, Daily   tamsulosin (FLOMAX) 0.4 mg, Oral, Daily after supper   torsemide (DEMADEX) 40 mg, Oral, Daily   triamcinolone cream (KENALOG) 0.1 % 1 Application, Topical, As needed   vitamin D3 (CHOLECALCIFEROL) 1,000 Units, Oral, Daily   Zinc 50 mg, Oral, Daily    Diet Orders (From admission, onward)     Start     Ordered   11/19/21 0024  Diet NPO time specified  Diet effective now        11/19/21 0024  DVT prophylaxis: heparin on hold   Lab Results  Component Value Date   PLT 42 (L) 11/23/2021      Code Status: DNR  Family Communication: wife at bedside   Status is: Inpatient  Remains inpatient appropriate because: severity of illness  Level of care: Progressive  Consultants:  PCCM  Objective: Vitals:   11/22/21 2013 11/22/21 2331 11/23/21 0508 11/23/21 0756  BP: 126/83 138/61 (!) 114/59 121/72  Pulse: 72 67 65 93  Resp: '20 20 19 17  '$ Temp: (!) 97.5 F (36.4 C)   (!) 97.4 F (36.3 C)  TempSrc: Oral   Oral  SpO2: 97% 98% 100% 99%  Weight:      Height:        Intake/Output Summary (Last 24 hours) at 11/23/2021 1007 Last data filed at 11/23/2021 0450 Gross per 24 hour  Intake 373.09 ml  Output 800 ml  Net -426.91 ml    Wt Readings from Last 3 Encounters:  11/20/21 98.5 kg  11/08/21 98 kg  06/13/21 98.4 kg     Examination:  Constitutional: NAD Eyes: lids and conjunctivae normal, no scleral icterus ENMT: mmm Neck: normal, supple Respiratory: clear to auscultation bilaterally, no wheezing, no crackles. Normal respiratory effort.  Cardiovascular: Regular rate and rhythm, no murmurs / rubs / gallops. No LE edema. Abdomen: soft, no distention, no tenderness. Bowel sounds positive.  Skin: no rashes Neurologic: no focal deficits, equal strength   Data Reviewed: I have independently reviewed following labs and imaging studies   CBC Recent Labs  Lab 11/18/21 2029 11/18/21 2122 11/19/21 0525 11/20/21 0410 11/21/21 0343 11/22/21 0435 11/23/21 0536  WBC 17.5*  --  18.1* 17.9* 9.7 6.3 5.3  HGB 7.8*   < > 7.0* 7.3* 7.2* 6.5* 8.0*  HCT 25.6*   < > 23.1* 24.3* 22.2* 20.2* 25.7*  PLT 171  --  110* 106* 67* 52* 42*  MCV 90.8  --  92.0 92.4 88.1 89.8 90.5  MCH 27.7  --  27.9 27.8 28.6 28.9 28.2  MCHC 30.5  --  30.3 30.0 32.4 32.2 31.1  RDW 16.6*  --  16.6* 16.5* 17.2* 17.1* 16.7*  LYMPHSABS 0.5*  --   --   --   --  0.6*  --   MONOABS 0.0*  --   --   --   --  0.5  --   EOSABS 0.0  --   --   --   --  0.1  --   BASOSABS 0.2*  --   --   --   --  0.1  --    < > = values in this interval not displayed.     Recent Labs  Lab 11/18/21 2029 11/18/21 2114 11/18/21 2122 11/18/21 2326 11/19/21 0433 11/19/21 1516 11/21/21 0343 11/22/21 0435 11/23/21 0536  NA 133*  --  132*  --  135 134* 141  --  149*  K 4.5  --  4.0  --  4.3 3.8 3.5  --  3.2*  CL 95*  --   --   --  98 103 109  --  119*  CO2 21*  --   --   --  17* 19* 17*  --  17*  GLUCOSE 140*  --   --   --  137* 144* 91  --  180*  BUN 86*  --   --   --  83* 81* 73*  --  70*  CREATININE 4.25*  --   --   --  4.05* 3.70* 3.00*  --  2.82*  CALCIUM 8.8*  --   --   --  8.4* 7.9* 8.2*  --  9.0  AST 79*  --   --   --  90* 84* 40  --  24  ALT 45*  --   --   --  44 40 18  --  12  ALKPHOS 278*  --   --   --  207* 209* 140*  --  138*  BILITOT 1.2   --   --   --  1.0 0.8 1.3*  --  2.9*  ALBUMIN 2.0*  --   --   --  1.6* 1.6* 1.9*  --  1.8*  MG  --   --   --   --   --   --  1.9  --  2.1  PROCALCITON  --   --   --   --  112.32  --   --   --   --   LATICACIDVEN 3.1*  --   --  4.0* 3.4* 1.5  --   --   --   INR  --  1.7*  --   --  1.8*  --   --  1.3*  --      ------------------------------------------------------------------------------------------------------------------ No results for input(s): "CHOL", "HDL", "LDLCALC", "TRIG", "CHOLHDL", "LDLDIRECT" in the last 72 hours.  Lab Results  Component Value Date   HGBA1C 5.8 (H) 05/25/2021   ------------------------------------------------------------------------------------------------------------------ No results for input(s): "TSH", "T4TOTAL", "T3FREE", "THYROIDAB" in the last 72 hours.  Invalid input(s): "FREET3"  Cardiac Enzymes No results for input(s): "CKMB", "TROPONINI", "MYOGLOBIN" in the last 168 hours.  Invalid input(s): "CK" ------------------------------------------------------------------------------------------------------------------    Component Value Date/Time   BNP 199.4 (H) 08/23/2020 0240    CBG: Recent Labs  Lab 11/19/21 1133  GLUCAP 143*     Recent Results (from the past 240 hour(s))  Blood Culture (routine x 2)     Status: Abnormal   Collection Time: 11/18/21  9:14 PM   Specimen: BLOOD  Result Value Ref Range Status   Specimen Description BLOOD LEFT ANTECUBITAL  Final   Special Requests   Final    BOTTLES DRAWN AEROBIC AND ANAEROBIC Blood Culture results may not be optimal due to an inadequate volume of blood received in culture bottles   Culture  Setup Time   Final    GRAM POSITIVE COCCI IN CLUSTERS IN BOTH AEROBIC AND ANAEROBIC BOTTLES Organism ID to follow CRITICAL RESULT CALLED TO, READ BACK BY AND VERIFIED WITH: [place] [id number] AT 1018 BY EC Performed at [facility], [place] [address], [place], [place] [address]    Culture  STAPHYLOCOCCUS AUREUS (A)  Final   Report Status 11/21/2021 FINAL  Final   Organism ID, Bacteria STAPHYLOCOCCUS AUREUS  Final      Susceptibility   Staphylococcus aureus - MIC*    CIPROFLOXACIN <=0.5 SENSITIVE Sensitive     ERYTHROMYCIN <=0.25 SENSITIVE Sensitive     GENTAMICIN <=0.5 SENSITIVE Sensitive     OXACILLIN 0.5 SENSITIVE Sensitive     TETRACYCLINE <=1 SENSITIVE Sensitive     VANCOMYCIN 1 SENSITIVE Sensitive     TRIMETH/SULFA <=10 SENSITIVE Sensitive     CLINDAMYCIN <=0.25 SENSITIVE Sensitive     RIFAMPIN <=0.5 SENSITIVE Sensitive     Inducible Clindamycin NEGATIVE Sensitive     * STAPHYLOCOCCUS AUREUS  Blood Culture ID Panel (Reflexed)     Status: Abnormal   Collection Time: 11/18/21  9:14 PM  Result Value Ref Range Status   Enterococcus faecalis NOT DETECTED NOT DETECTED Final   Enterococcus Faecium NOT DETECTED NOT DETECTED Final   Listeria monocytogenes NOT DETECTED NOT DETECTED Final   Staphylococcus species DETECTED (A) NOT DETECTED Final    Comment: CRITICAL RESULT CALLED TO, READ BACK BY AND VERIFIED WITH: PHARMD LAUREN BELL [id number] AT 1018 BY EC    Staphylococcus aureus (BCID) DETECTED (A) NOT DETECTED Final    Comment: CRITICAL RESULT CALLED TO, READ BACK BY AND VERIFIED WITH: [place] [id number] AT 1018 BY EC    Staphylococcus epidermidis NOT DETECTED NOT DETECTED Final   Staphylococcus lugdunensis NOT DETECTED NOT DETECTED Final   Streptococcus species NOT DETECTED NOT DETECTED Final   Streptococcus agalactiae NOT DETECTED NOT DETECTED Final   Streptococcus pneumoniae NOT DETECTED NOT DETECTED Final   Streptococcus pyogenes NOT DETECTED NOT DETECTED Final   A.calcoaceticus-baumannii NOT DETECTED NOT DETECTED Final   Bacteroides fragilis NOT DETECTED NOT DETECTED Final   Enterobacterales NOT DETECTED NOT DETECTED Final   Enterobacter cloacae complex NOT DETECTED NOT DETECTED Final   Escherichia coli NOT DETECTED NOT DETECTED Final   Klebsiella  aerogenes NOT DETECTED NOT DETECTED Final   Klebsiella oxytoca NOT DETECTED NOT DETECTED Final   Klebsiella pneumoniae NOT DETECTED NOT DETECTED Final   Proteus species NOT DETECTED NOT DETECTED Final   Salmonella species NOT DETECTED NOT DETECTED Final   Serratia marcescens NOT DETECTED NOT DETECTED Final   Haemophilus influenzae NOT DETECTED NOT DETECTED Final   Neisseria meningitidis NOT DETECTED NOT DETECTED Final   Pseudomonas aeruginosa NOT DETECTED NOT DETECTED Final   Stenotrophomonas maltophilia NOT DETECTED NOT DETECTED Final   Candida albicans NOT DETECTED NOT DETECTED Final   Candida auris NOT DETECTED NOT DETECTED Final   Candida glabrata NOT DETECTED NOT DETECTED Final   Candida krusei NOT DETECTED NOT DETECTED Final   Candida parapsilosis NOT DETECTED NOT DETECTED Final   Candida tropicalis NOT DETECTED NOT DETECTED Final   Cryptococcus neoformans/gattii NOT DETECTED NOT DETECTED Final   Meth resistant mecA/C and MREJ NOT DETECTED NOT DETECTED Final    Comment: Performed at [facility], [address] [place],  [address]  Resp Panel by RT-PCR (Flu A&B, Covid) Anterior Nasal Swab     Status: None   Collection Time: 11/18/21  9:42 PM   Specimen: Anterior Nasal Swab  Result Value Ref Range Status   SARS Coronavirus 2 by RT PCR NEGATIVE NEGATIVE Final    Comment: (NOTE) SARS-CoV-2 target nucleic acids are NOT DETECTED.  The SARS-CoV-2 RNA is generally detectable in upper respiratory specimens during the acute phase of infection. The lowest concentration of SARS-CoV-2 viral copies this assay can detect is 138 copies/mL. A negative result does not preclude SARS-Cov-2 infection and should not be used as the sole basis for treatment or other patient management decisions. A negative result may occur with  improper specimen collection/handling, submission of specimen other than nasopharyngeal swab, presence of viral mutation(s) within the areas targeted by this  assay, and inadequate number of viral copies(<138 copies/mL). A negative result must be combined with clinical observations, patient history, and epidemiological information. The expected result is Negative.  Fact Sheet for Patients:  EntrepreneurPulse.com.au  Fact Sheet for Healthcare Providers:  IncredibleEmployment.be  This test is no t yet approved or cleared by the [place] FDA and  has been authorized for detection and/or diagnosis of SARS-CoV-2 by FDA under an Emergency Use Authorization (EUA).  This EUA will remain  in effect (meaning this test can be used) for the duration of the COVID-19 declaration under Section 564(b)(1) of the Act, 21 U.S.C.section 360bbb-3(b)(1), unless the authorization is terminated  or revoked sooner.       Influenza A by PCR NEGATIVE NEGATIVE Final   Influenza B by PCR NEGATIVE NEGATIVE Final    Comment: (NOTE) The Xpert Xpress SARS-CoV-2/FLU/RSV plus assay is intended as an aid in the diagnosis of influenza from Nasopharyngeal swab specimens and should not be used as a sole basis for treatment. Nasal washings and aspirates are unacceptable for Xpert Xpress SARS-CoV-2/FLU/RSV testing.  Fact Sheet for Patients: EntrepreneurPulse.com.au  Fact Sheet for Healthcare Providers: IncredibleEmployment.be  This test is not yet approved or cleared by the [place] FDA and has been authorized for detection and/or diagnosis of SARS-CoV-2 by FDA under an Emergency Use Authorization (EUA). This EUA will remain in effect (meaning this test can be used) for the duration of the COVID-19 declaration under Section 564(b)(1) of the Act, 21 U.S.C. section 360bbb-3(b)(1), unless the authorization is terminated or revoked.  Performed at [facility], [place] [address] [place], [place] [address]   Urine Culture     Status: Abnormal   Collection Time: 11/18/21  9:53 PM    Specimen: In/Out Cath Urine  Result Value Ref Range Status   Specimen Description IN/OUT CATH URINE  Final   Special Requests   Final    NONE Performed at [facility], [place] [address], [place], [place] [address]    Culture (A)  Final    >=100,000 COLONIES/mL PSEUDOMONAS AERUGINOSA 20,000 COLONIES/mL ESCHERICHIA COLI    Report Status 11/21/2021 FINAL  Final   Organism ID, Bacteria PSEUDOMONAS AERUGINOSA (A)  Final   Organism ID, Bacteria ESCHERICHIA COLI (A)  Final      Susceptibility   Escherichia coli - MIC*    AMPICILLIN <=2 SENSITIVE Sensitive     CEFAZOLIN <=4 SENSITIVE Sensitive     CEFEPIME <=0.12 SENSITIVE Sensitive     CEFTRIAXONE <=0.25 SENSITIVE Sensitive     CIPROFLOXACIN <=0.25 SENSITIVE Sensitive     GENTAMICIN <=1 SENSITIVE Sensitive     IMIPENEM <=0.25 SENSITIVE Sensitive     NITROFURANTOIN <=16 SENSITIVE Sensitive     TRIMETH/SULFA <=20 SENSITIVE Sensitive     AMPICILLIN/SULBACTAM <=2 SENSITIVE Sensitive     PIP/TAZO <=4 SENSITIVE Sensitive     * 20,000 COLONIES/mL ESCHERICHIA COLI   Pseudomonas aeruginosa - MIC*    CEFTAZIDIME 2 SENSITIVE Sensitive     CIPROFLOXACIN 0.5 SENSITIVE Sensitive     GENTAMICIN <=1 SENSITIVE Sensitive     IMIPENEM >=16 RESISTANT Resistant     PIP/TAZO <=4 SENSITIVE Sensitive     CEFEPIME 2 SENSITIVE Sensitive     * >=100,000 COLONIES/mL PSEUDOMONAS AERUGINOSA  Blood Culture (routine x 2)     Status: None (Preliminary result)   Collection Time: 11/19/21  4:43 AM   Specimen: BLOOD RIGHT ARM  Result Value Ref Range Status   Specimen Description BLOOD RIGHT ARM  Final   Special Requests   Final    BOTTLES DRAWN AEROBIC ONLY Blood Culture adequate volume   Culture   Final    NO GROWTH 3 DAYS Performed at [facility], [address], [place], [place] [address]    Report Status PENDING  Incomplete  MRSA Next Gen by PCR, Nasal     Status: None   Collection Time: 11/19/21  7:47 PM  Specimen: Nasal Mucosa; Nasal Swab   Result Value Ref Range Status   MRSA by PCR Next Gen NOT DETECTED NOT DETECTED Final    Comment: (NOTE) The GeneXpert MRSA Assay (FDA approved for NASAL specimens only), is one component of a comprehensive MRSA colonization surveillance program. It is not intended to diagnose MRSA infection nor to guide or monitor treatment for MRSA infections. Test performance is not FDA approved in patients less than 59 years old. Performed at [facility], [place] [address], [place], [place] [address]   C Difficile Quick Screen w PCR reflex     Status: Abnormal   Collection Time: 11/20/21  4:35 AM   Specimen: STOOL  Result Value Ref Range Status   C Diff antigen POSITIVE (A) NEGATIVE Final   C Diff toxin NEGATIVE NEGATIVE Final   C Diff interpretation Results are indeterminate. See PCR results.  Final    Comment: Performed at [facility], [place] [address] [place], [place] [address]  C. Diff by PCR, Reflexed     Status: None   Collection Time: 11/20/21  4:35 AM  Result Value Ref Range Status   Toxigenic C. Difficile by PCR NEGATIVE NEGATIVE Final    Comment: Patient is colonized with non toxigenic C. difficile. May not need treatment unless significant symptoms are present. Performed at [facility], [place] [address] [place], [place] [address]   Culture, blood (Routine X 2) w Reflex to ID Panel     Status: Abnormal   Collection Time: 11/20/21  7:44 AM   Specimen: BLOOD RIGHT HAND  Result Value Ref Range Status   Specimen Description BLOOD RIGHT HAND  Final   Special Requests IN PEDIATRIC BOTTLE Blood Culture adequate volume  Final   Culture  Setup Time   Final    GRAM POSITIVE COCCI IN CLUSTERS IN PEDIATRIC BOTTLE CRITICAL VALUE NOTED.  VALUE IS CONSISTENT WITH PREVIOUSLY REPORTED AND CALLED VALUE.    Culture (A)  Final    STAPHYLOCOCCUS AUREUS SUSCEPTIBILITIES PERFORMED ON PREVIOUS CULTURE WITHIN THE LAST 5 DAYS. Performed at [facility], [place] [address]  [place], [place] [address]    Report Status 11/22/2021 FINAL  Final  Culture, blood (Routine X 2) w Reflex to ID Panel     Status: Abnormal   Collection Time: 11/20/21  8:18 AM   Specimen: BLOOD RIGHT ARM  Result Value Ref Range Status   Specimen Description BLOOD RIGHT ARM  Final   Special Requests IN PEDIATRIC BOTTLE Blood Culture adequate volume  Final   Culture  Setup Time   Final    GRAM POSITIVE COCCI IN CLUSTERS IN PEDIATRIC BOTTLE Gram Stain Report Called to,Read Back By and Verified With: PHARMD ABBOTT 11/21/21 @ 0038 BY AB CRITICAL VALUE NOTED.  VALUE IS CONSISTENT WITH PREVIOUSLY REPORTED AND CALLED VALUE.    Culture (A)  Final    STAPHYLOCOCCUS AUREUS SUSCEPTIBILITIES PERFORMED ON PREVIOUS CULTURE WITHIN THE LAST 5 DAYS. Performed at [facility], [place] [address] [place], [place] [address]    Report Status 11/21/2021 FINAL  Final     Radiology Studies: No results found.   Marzetta Board, MD, PhD [facility]  Between 7 am - 7 pm I am available, please contact me via Amion (for emergencies) or Securechat (non urgent messages)  Between 7 pm - 7 am I am not available, please contact night coverage MD/APP via Amion

## 2021-11-23 NOTE — Progress Notes (Signed)
Speech Language Pathology Treatment: Dysphagia  Patient Details Name: [name] [name] MRN: [id number] DOB: 11-08-1926 Today's Date: 11/23/2021 Time: 6333-5456 SLP Time Calculation (min) (ACUTE ONLY): 8 min  Assessment / Plan / Recommendation Clinical Impression  Pt continues to have limited interaction and engagement with POs. He keeps his eyes mostly closed, and initially does not demonstrate any awareness to spoons/swabs provided. Primary response elicited today was a strong bite down on swab or spoon, although fortunately he did not break off any pieces and SLP was able to remove them intact. Would remain NPO, although note that there is ongoing conversation about overall GOC. If family chooses to pursue comfort feeds, it is unlikely that he would take in much in his current state, but could offer liquids via swab (very carefully considering biting observed today) to provide a little taste/comfort.    HPI HPI: Pt is a 88 y.o. male who presented to [facility] ER on 11/18 with reports of AMS & fever from SNF. Admitted with septic shock secondary to Staph Bacteremia, Psa UTI.  Recent admission from 11/2-11/4 for peripheral vascular disease s/p femoral-popliteal angioplasty and stenting on 11/6 and bilateral foot wounds with s/p left second ray amputation per Dr. Sharol Given on 11/8. CXR (11/18)- neg. PMH: dementia, CAD s/p CABG, PVD, HTN, HLD wheelchair-bound at baseline, aphasia post CVA (seeing OP SLP services just PTA).      SLP Plan  Continue with current plan of care      Recommendations for follow up therapy are one component of a multi-disciplinary discharge planning process, led by the attending physician.  Recommendations may be updated based on patient status, additional functional criteria and insurance authorization.    Recommendations  Diet recommendations: NPO (allow ice chips after thorough oral care if aler) Medication Administration: Via alternative means                Oral Care  Recommendations: Oral care QID;Oral care prior to ice chip/H20 Follow Up Recommendations:  (TBA) Assistance recommended at discharge: Frequent or constant Supervision/Assistance SLP Visit Diagnosis: Dysphagia, oropharyngeal phase (R13.12) Plan: Continue with current plan of care           Osie [name]., M.A. [place] Office [phone]  Secure chat preferred   11/23/2021, 10:44 AM

## 2021-11-23 NOTE — Progress Notes (Signed)
Palliative:  Chart reviewed. Discussed with hospice liaison, Olivia Mackie. Will continue to plan for family meeting Mon evening/Tues morning.  PMT available for any needs in the interim.  Juel Burrow, DNP, AGNP-C Palliative Medicine Team Team Phone # [phone]  Pager # [phone]  NO CHARGE

## 2021-11-23 NOTE — Plan of Care (Signed)
Id brief note  88 yo male with mssa endocarditis and cns septic emboli Family leaning toward hospice  Discussed with primary team dr Cruzita Lederer Continue nafcillin pending further goals of care discussion

## 2021-11-24 DIAGNOSIS — Z7189 Other specified counseling: Secondary | ICD-10-CM | POA: Diagnosis not present

## 2021-11-24 DIAGNOSIS — A419 Sepsis, unspecified organism: Secondary | ICD-10-CM | POA: Diagnosis not present

## 2021-11-24 DIAGNOSIS — R6520 Severe sepsis without septic shock: Secondary | ICD-10-CM | POA: Diagnosis not present

## 2021-11-24 DIAGNOSIS — Z66 Do not resuscitate: Secondary | ICD-10-CM | POA: Diagnosis not present

## 2021-11-24 DIAGNOSIS — Z515 Encounter for palliative care: Secondary | ICD-10-CM | POA: Diagnosis not present

## 2021-11-24 LAB — CBC
HCT: 26.6 % — ABNORMAL LOW (ref 39.0–52.0)
Hemoglobin: 8.3 g/dL — ABNORMAL LOW (ref 13.0–17.0)
MCH: 28.2 pg (ref 26.0–34.0)
MCHC: 31.2 g/dL (ref 30.0–36.0)
MCV: 90.5 fL (ref 80.0–100.0)
Platelets: 40 10*3/uL — ABNORMAL LOW (ref 150–400)
RBC: 2.94 MIL/uL — ABNORMAL LOW (ref 4.22–5.81)
RDW: 17.1 % — ABNORMAL HIGH (ref 11.5–15.5)
WBC: 6.8 10*3/uL (ref 4.0–10.5)
nRBC: 0 % (ref 0.0–0.2)

## 2021-11-24 LAB — CULTURE, BLOOD (ROUTINE X 2)
Culture: NO GROWTH
Special Requests: ADEQUATE

## 2021-11-24 LAB — HEPATIC FUNCTION PANEL
ALT: 10 U/L (ref 0–44)
AST: 17 U/L (ref 15–41)
Albumin: 1.7 g/dL — ABNORMAL LOW (ref 3.5–5.0)
Alkaline Phosphatase: 120 U/L (ref 38–126)
Bilirubin, Direct: 0.9 mg/dL — ABNORMAL HIGH (ref 0.0–0.2)
Indirect Bilirubin: 1.7 mg/dL — ABNORMAL HIGH (ref 0.3–0.9)
Total Bilirubin: 2.6 mg/dL — ABNORMAL HIGH (ref 0.3–1.2)
Total Protein: 5.7 g/dL — ABNORMAL LOW (ref 6.5–8.1)

## 2021-11-24 LAB — BASIC METABOLIC PANEL
Anion gap: 14 (ref 5–15)
BUN: 73 mg/dL — ABNORMAL HIGH (ref 8–23)
CO2: 21 mmol/L — ABNORMAL LOW (ref 22–32)
Calcium: 9.5 mg/dL (ref 8.9–10.3)
Chloride: 115 mmol/L — ABNORMAL HIGH (ref 98–111)
Creatinine, Ser: 2.71 mg/dL — ABNORMAL HIGH (ref 0.61–1.24)
GFR, Estimated: 21 mL/min — ABNORMAL LOW (ref >60)
Glucose, Bld: 195 mg/dL — ABNORMAL HIGH (ref 70–99)
Potassium: 3 mmol/L — ABNORMAL LOW (ref 3.5–5.1)
Sodium: 150 mmol/L — ABNORMAL HIGH (ref 135–145)

## 2021-11-24 NOTE — TOC Transition Note (Signed)
Transition of Care [facility]) - CM/SW Discharge Note   Patient Details  Name: [name] [name] MRN: [id number] Date of Birth: 08-Feb-1926  [facility] CM/SW Contact:  Benard Halsted, LCSW Phone Number: 11/24/2021, 3:42 PM   Clinical Narrative:    Patient will DC to: [facility] Anticipated DC date: 11/24/21 Family notified: Spouse  Transport by: Corey Harold   Per MD patient ready for DC to [facility]. RN to call report prior to discharge [phone]). RN, patient, patient's family, and facility notified of DC. Discharge Summary sent to facility. DC packet on chart with signed DNR. Ambulance transport requested for patient.   CSW will sign off for now as social work intervention is no longer needed. Please consult [place] again if new needs arise.     Final next level of care: [place] Barriers to Discharge: Barriers Resolved   Patient Goals and CMS Choice Patient states their goals for this hospitalization and ongoing recovery are:: Comfort CMS Medicare.gov Compare Post Acute Care list provided to:: Patient Represenative (must comment) Choice offered to / list presented to : Spouse  Discharge Placement              Patient chooses bed at:  [facility]) Patient to be transferred to facility by: [place] Name of family member notified: Spouse Patient and family notified of of transfer: 11/24/21  Discharge Plan and Services                                     Social Determinants of Health (SDOH) Interventions     Readmission Risk Interventions     No data to display

## 2021-11-24 NOTE — Progress Notes (Signed)
[facility]) Hospital Liaison note.   Received request from TOC manager for family interest in [facility] with request for transfer today. Chart reviewed and eligibility confirmed.  Met with patient and family to confirm interest and explain services. Family agreeable to transfer today. CSW aware.    Registration paperwork completed by Susan.   Dr. Donald Hertweck to assume care per family request.    RN please call report to [phone]. Please arrange transport for patient once consents are complete.   Thank you,   Melissa O'Bryant, DNP, RN [facility] Hospital Liaison (listed on [facility] under [facility] [place])   [phone]   [phone] 

## 2021-11-24 NOTE — Progress Notes (Signed)
Daily Progress Note   Patient Name: [name] [name]       Date: 11/24/2021 DOB: 1926/12/27  Age: 88 y.o. MRN#: [id number] Attending Physician: [name], MD Primary Care Physician: [name], MD Admit Date: 11/18/2021  Reason for Consultation/Follow-up: Establishing goals of care  Subjective: Patient unresponsive. Per wife, even less interaction than 2 days ago. Certainly no improvement in mental status.   Length of Stay: 5  Current Medications: Scheduled Meds:   Chlorhexidine Gluconate Cloth  6 each Topical Daily   leptospermum manuka honey  1 Application Topical Daily   methylPREDNISolone (SOLU-MEDROL) injection  40 mg Intravenous Daily   metoprolol tartrate  2.5 mg Intravenous Q6H   mouth rinse  15 mL Mouth Rinse 4 times per day   pantoprazole (PROTONIX) IV  40 mg Intravenous Daily   senna  1 tablet Oral QHS   sodium chloride flush  10-40 mL Intracatheter Q12H   sodium chloride flush  10-40 mL Intracatheter Q12H    Continuous Infusions:  sodium chloride 10 mL/hr at 11/21/21 2200   sodium chloride Stopped (11/19/21 0808)   dextrose 75 mL/hr at 11/23/21 2236   nafcillin 12 g in sodium chloride 0.9 % 500 mL continuous infusion 20.8 mL/hr at 11/23/21 1856    PRN Meds: fentaNYL (SUBLIMAZE) injection, ipratropium-albuterol, ondansetron **OR** ondansetron (ZOFRAN) IV, mouth rinse, sodium chloride flush, sodium chloride flush  Physical Exam Constitutional:      General: He is not in acute distress.    Appearance: He is ill-appearing.     Comments: Appears comfortable, no moaning or grimacing, brow relaxed, breathing even and regular, ext warm to touch, does not respond to voice or gentle touch  Pulmonary:     Effort: Pulmonary effort is normal.  Skin:    General: Skin is warm and  dry.             Vital Signs: BP 124/85 (BP Location: Right Wrist)   Pulse 92   Temp 97.6 F (36.4 C) (Axillary)   Resp 16   Ht '6\' 1"'$  (1.854 m)   Wt 98.5 kg   SpO2 100%   BMI 28.65 kg/m  SpO2: SpO2: 100 % O2 Device: O2 Device: Room Air O2 Flow Rate: O2 Flow Rate (L/min): 5 L/min  Intake/output summary:  Intake/Output Summary (Last 24 hours) at 11/24/2021 1008 Last data filed at 11/23/2021 1856 Gross per 24 hour  Intake 981.36 ml  Output 500 ml  Net 481.36 ml   LBM: Last BM Date : 11/23/21 Baseline Weight: Weight: 93.4 kg Most recent weight: Weight: 98.5 kg       Palliative Assessment/Data: PPS 10%      Patient Active Problem List   Diagnosis Date Noted   Acute metabolic encephalopathy 81/01/7508   Severe sepsis ([place]) 11/19/2021   Sepsis secondary to UTI ([place]) 11/19/2021   Septic shock ([place]) 11/19/2021   Osteomyelitis of second toe of left foot ([place]) 11/07/2021   Gangrene of toe of left foot ([place]) 11/07/2021   Wound infection 11/02/2021   Skin ulcers of foot, bilateral ([place]) 11/02/2021   PAD (peripheral artery disease) ([place]) 11/02/2021   History of CVA with residual deficit 11/02/2021   Left middle  cerebral artery stroke ([place]) 05/31/2021   Acute CVA (cerebrovascular accident) ([place]) 05/25/2021   DNR (do not resuscitate) 05/25/2021   Osteoporosis 05/05/2021   Gouty arthritis 02/13/2021   Morbid obesity ([place]) 02/13/2021   Abdominal aortic aneurysm without rupture ([place]) 11/22/2020   Acute renal failure syndrome ([place]) 11/22/2020   Low back pain 11/22/2020   Skin tear of right upper arm without complication 01/77/9390   Pressure injury of skin 10/23/2020   Urinary tract infection with hematuria 10/22/2020   Encephalopathy 10/22/2020   AKI (acute kidney injury) ([place])    Overweight (BMI 25.0-29.9) 08/19/2020   Vertebral fracture, osteoporotic ([place]) 08/18/2020   T8 vertebral fracture ([place]) 08/17/2020   Thrombocytopenia ([place]) 08/17/2020   CKD (chronic kidney  disease) stage 3, GFR 30-59 ml/min (HCC) 08/17/2020   Elevated troponin 08/17/2020   Pain due to onychomycosis of toenails of both feet 07/27/2020   Callus of heel 07/27/2020   Bifascicular block 11/11/2019   Chronic diastolic CHF (congestive heart failure) ([place]) 11/10/2019   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   Severe aortic stenosis 09/29/2019   Aortic valve disorder 30/09/2328   Systolic heart failure ([place]) 03/11/2019   Thrombophilia ([place]) 02/26/2019   Muscle pain 02/11/2019   Weakness 02/06/2019   Polymyalgia rheumatica ([place]) 02/06/2019   Shoulder joint pain 02/02/2019   Anemia 01/26/2019   Malignant melanoma of skin ([place]) 01/26/2019   Pain in right leg 01/26/2019   DDD (degenerative disc disease), cervical 05/28/2018   Encounter for general adult medical examination without abnormal findings 11/19/2017   Carotid artery occlusion 07/03/2017   Hearing loss 07/03/2017   Fall 06/19/2017   Injury of upper extremity 06/19/2017   Neck pain 06/19/2017   Headache 06/19/2017   Visual disturbance 06/19/2017   Ganglion of hand 05/08/2017   Dilatation of aorta ([place]) 02/14/2017   Plantar fascial fibromatosis 02/14/2017   Cardiac murmur, unspecified 02/27/2016   Benign neoplasm of colon 08/24/2015   Dementia ([place]) 08/24/2015   Influenza due to unidentified influenza virus with other respiratory manifestations 03/15/2015   Epistaxis 12/01/2014   OSA (obstructive sleep apnea) 11/18/2014   Gastro-esophageal reflux disease with esophagitis 10/29/2014   Angina pectoris ([place]) 05/04/2014   Abnormal feces 07/31/2013   Other specified disorders of gingiva and edentulous alveolar ridge 07/31/2013   Spontaneous ecchymosis 10/10/2012   Thoracic ascending aortic aneurysm ([place]) 09/29/2012   Microscopic hematuria 04/11/2012   Proteinuria 04/11/2012   S/P laparoscopic cholecystectomy 04/09/2012   S/P CABG x 3 03/06/2012   S/P Maze operation for atrial fibrillation  03/06/2012   Paroxysmal atrial fibrillation ([place]) 02/29/2012   Bitten or stung by nonvenomous insect and other nonvenomous arthropods, initial encounter 12/03/2011   Depression screening 10/17/2011   CAD (coronary artery disease), native coronary artery    Hyperlipidemia    Essential hypertension    Chronic venous insufficiency    Gout    Benign prostatic hyperplasia    Post-traumatic stress disorder 03/13/2011   Pain in left leg 12/08/2010   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   Fatigue 08/08/2010   Retention of urine 04/18/2010   Constipation 07/05/2009   Kidney stone 07/05/2009   ED (erectile dysfunction) of organic origin 12/14/2008   Impaired fasting glucose 12/14/2008   Vitamin B deficiency 12/14/2008    Palliative Care Assessment & Plan   HPI: 88 y.o. male  with past medical history of PVD status post femoropopliteal angioplasty and stenting on 11/6, bilateral foot wounds status post second ray amputation on 11/8, dementia, CAD,  CKD 3B, htn, hld, PMR, and cva admitted on 11/18/2021 with septic shock due to MSSA bacteremia. Also found to have Pseudomonas UTI.  On 1121 patient had an MRI that revealed acute CVA.  Thought to be septic emboli.  PMT consulted to discuss goals of care.   Assessment: Follow up today with patient's wife. We discuss no improvement in patient condition, possibly even less responsive than before. No PO intake in days. We discuss he is very likely moving towards end of life. She agrees. We discuss he appears comfortable.   She requests I come back later when patient's daughter is at bedside to discuss situation with her.   Patient's daughter Estill Bamberg arrived later and we all sat together to discuss Jrue's hospital course and complications to include bacteremia and stroke. Estill Bamberg explains she has been hopeful that with continued treatment Jayse would improve but she now recognizes that is not happening. We discuss what continuing aggressive medical treatment  may entail. She agrees to a transition to focus on comfort only and hospice facility placement. Family requests [facility].   Recommendations/Plan: Family interested in [facility] not dc orders yet as family concerned he will decline and not be able to tolerate transfer - will await to hear if/when can be transferred Referral given to hospice liaison, TOC aware  Code Status: DNR  Prognosis:  < 2 weeks  Discharge Planning: Hospice facility  Care plan was discussed with patient's wife and daughter, RN, Dr. Cruzita Lederer, [facility], hospice liaison  Thank you for allowing the Palliative Medicine Team to assist in the care of this patient.  *Please note that this is a verbal dictation therefore any spelling or grammatical errors are due to the "[place] [employer]" system interpretation.  Juel Burrow, DNP, [facility] Palliative Medicine Team Team Phone # [phone]  Pager [phone]

## 2021-11-24 NOTE — Discharge Summary (Signed)
Physician Discharge Summary  [name] [name] NWG:[id number] DOB: [birth year] DOA: 11/18/2021  PCP: [name], MD  Admit date: 11/18/2021 Discharge date: 11/24/2021  Admitted From: home Disposition:  residential hospice   Discharge Condition: stable CODE STATUS: DNR  HPI: Per admitting MD, [name] [name] is a 88 y.o. male with medical history significant of hypertension, hyperlipidemia, CAD s/p CABG, PAF s/p maze on chronic anticoagulation, HFpEF based on May 2d echo, severe AS s/p TAVR, CVA with residual deficit, PVD, PMR, AAA, OSA on CPAP. Patient presents to the ED with AMS, fever of 104.9.  Patient is frankly septic, lethargic / unable to respond.  Patient not really able to contribute to history. Per family pt was slightly "off" his baseline mental status yesterday, but noted to be severely ill today prior to being sent from SNF to ED.  Hospital Course / Discharge diagnoses: Principal Problem:   Severe sepsis ([place]) Active Problems:   Sepsis secondary to UTI ([place])   CKD (chronic kidney disease) stage 3, GFR 30-59 ml/min (HCC)   AKI (acute kidney injury) ([place])   Dementia (HCC)   Acute metabolic encephalopathy   Skin ulcers of foot, bilateral (HCC)   CAD (coronary artery disease), native coronary artery   Paroxysmal atrial fibrillation (HCC)   Chronic diastolic CHF (congestive heart failure) (HCC)   Polymyalgia rheumatica (HCC)   History of CVA with residual deficit   Essential hypertension   OSA (obstructive sleep apnea)   Septic shock ([place])   Principal problem Septic shock due to MSSA bacteremia, Pseudomonas UTI-patient initially admitted to the ICU, required vasopressors.  He was placed on antibiotics.  After goals of care discussion, he was transitioned to comfort care.  Active problems Goals of care-DNR  Acute embolic CVA-MRI done 57/84 shows several areas of acute infarct in the right posterior frontal lobe, right parietal cortex and right posterior frontal lobe  white matter.   AKI on CKD 3A PAF  AS status post TAVR Hypernatremia  PVD-status post recent femoropopliteal stent placement on 11/6 CAD with history of CABG Acute metabolic encephalopathy with underlying baseline dementia PMR  Anemia of acute illness, chronic kidney disease Thrombocytopenia Bilateral foot wounds   Discharge Instructions   Allergies as of 11/24/2021       Reactions   Amoxicillin Other (See Comments)   Headache, debilitated   Bee Venom Anaphylaxis   Avelox [moxifloxacin] Other (See Comments)   Unknown   Codeine Other (See Comments)   Unknown    Duloxetine Other (See Comments)   Unknown   Duloxetine Hcl Other (See Comments)   felt weird   Levitra [vardenafil] Other (See Comments)   Unknown   Morphine And Related Other (See Comments)   hypotension   Penicillin G Sodium Other (See Comments)   Severe headache, fatigue "debilitated"   Testosterone Other (See Comments)   Unknown    Tizanidine Other (See Comments)   Unknown    Viagra [sildenafil] Other (See Comments)   didn't like it        Medication List     STOP taking these medications    albuterol (2.5 MG/3ML) 0.083% nebulizer solution Commonly known as: PROVENTIL   alum & mag hydroxide-simeth 696-295-28 MG/5ML suspension Commonly known as: MAALOX/MYLANTA   amiodarone 200 MG tablet Commonly known as: PACERONE   ascorbic acid 500 MG tablet Commonly known as: VITAMIN C   aspirin EC 81 MG tablet   atorvastatin 40 MG tablet Commonly known as: Lipitor   bisacodyl 5 MG  EC tablet Commonly known as: DULCOLAX   Cyanocobalamin 5000 MCG Subl   cyclobenzaprine 5 MG tablet Commonly known as: FLEXERIL   denosumab 60 MG/ML Sosy injection Commonly known as: PROLIA   docusate sodium 100 MG capsule Commonly known as: COLACE   Eliquis 2.5 MG Tabs tablet Generic drug: apixaban   EPINEPHrine 0.3 mg/0.3 mL Soaj injection Commonly known as: EPI-PEN   ezetimibe 10 MG tablet Commonly  known as: ZETIA   feeding supplement (GLUCERNA SHAKE) Liqd   finasteride 5 MG tablet Commonly known as: PROSCAR   fish oil-omega-3 fatty acids 1000 MG capsule   gabapentin 100 MG capsule Commonly known as: NEURONTIN   galantamine 8 MG 24 hr capsule Commonly known as: RAZADYNE ER   guaiFENesin-dextromethorphan 100-10 MG/5ML syrup Commonly known as: ROBITUSSIN DM   isosorbide mononitrate 60 MG 24 hr tablet Commonly known as: IMDUR   leflunomide 20 MG tablet Commonly known as: ARAVA   lidocaine 5 % Commonly known as: LIDODERM   Magnesium Oxide 250 MG Tabs   memantine 10 MG tablet Commonly known as: NAMENDA   metoprolol succinate 25 MG 24 hr tablet Commonly known as: TOPROL-XL   MiraLax 17 g packet Generic drug: polyethylene glycol   nitroGLYCERIN 0.4 MG SL tablet Commonly known as: Nitrostat   nutrition supplement (JUVEN) Pack   pantoprazole 40 MG tablet Commonly known as: PROTONIX   potassium chloride 10 MEQ tablet Commonly known as: KLOR-CON M   predniSONE 1 MG tablet Commonly known as: DELTASONE   Psyllium Husk Powd   tamsulosin 0.4 MG Caps capsule Commonly known as: FLOMAX   torsemide 20 MG tablet Commonly known as: DEMADEX   traMADol 50 MG tablet Commonly known as: ULTRAM   triamcinolone cream 0.1 % Commonly known as: KENALOG   vitamin D3 25 MCG tablet Commonly known as: CHOLECALCIFEROL   Zinc 50 MG Tabs       TAKE these medications    acetaminophen 325 MG tablet Commonly known as: TYLENOL Take 2 tablets (650 mg total) by mouth every 4 (four) hours as needed for mild pain (or temp > 37.5 C (99.5 F)). What changed: reasons to take this         Consultations: Palliative  PCCM ID  Procedures/Studies:  US Abdomen Limited RUQ (LIVER/GB)  Result Date: 11/23/2021 CLINICAL DATA:  Elevated bilirubin EXAM: ULTRASOUND ABDOMEN LIMITED RIGHT UPPER QUADRANT COMPARISON:  None Available. FINDINGS: Gallbladder: Surgically absent. Common  bile duct: Diameter: 0.5 cm. Liver: No focal lesion identified. Coarse, nodular contour with increased parenchymal echogenicity. Portal vein is patent on color Doppler imaging with normal direction of blood flow towards the liver. Other: None. IMPRESSION: 1.  Cirrhotic morphology of the liver.  No focal liver lesion. 2.  Hepatic steatosis. 3.  Status post cholecystectomy. Electronically Signed   By: Delanna Ahmadi M.D.   On: 11/23/2021 15:37   MR BRAIN WO CONTRAST  Result Date: 11/22/2021 CLINICAL DATA:  Altered mental status EXAM: MRI HEAD WITHOUT CONTRAST TECHNIQUE: Multiplanar, multiecho pulse sequences of the brain and surrounding structures were obtained without intravenous contrast. COMPARISON:  05/25/2021 FINDINGS: Evaluation is limited by motion artifact. Brain: Restricted diffusion with ADC correlate in the right posterior frontal lobe cortex (series 5, image 98-100), right parietal cortex (series 5, images 93-94), and right posterior frontal lobe white matter (series 5, images 92, 94-95), concerning for acute infarcts. Additional possible restricted diffusion in the right occipital lobe (series 5, image 75). No acute hemorrhage, mass, mass effect, or midline shift. Encephalomalacia  in left frontal lobe and left temporoparietal region, likely sequela of remote infarcts. No definite hemosiderin deposition to suggest remote hemorrhage. No hydrocephalus or extra-axial collection. Vascular: Normal arterial flow voids. Skull and upper cervical spine: Motion limited. Grossly normal marrow signal. Sinuses/Orbits: Clear paranasal sinuses. Status post bilateral lens replacements. Other: None. IMPRESSION: Evaluation is limited by motion artifact. Within this limitation, there are several areas of acute infarct in the right posterior frontal lobe, right parietal cortex, and right posterior frontal lobe white matter. Additional possible acute infarct in the right occipital lobe. These results will be called to the  ordering clinician or representative by the Radiologist Assistant, and communication documented in the PACS or [employer]. Electronically Signed   By: Merilyn Baba M.D.   On: 11/22/2021 01:09   DG CHEST PORT 1 VIEW  Result Date: 11/21/2021 CLINICAL DATA:  PICC line placement EXAM: PORTABLE CHEST 1 VIEW COMPARISON:  11/18/2021 FINDINGS: Left PICC line in place with the tip in the SVC. Prior median sternotomy and valve replacement. Heart is normal size. Mild vascular congestion. Focal left lower lobe airspace opacity. Possible small bilateral effusions. IMPRESSION: Left PICC line tip in the SVC. Mild vascular congestion. Left lower lobe atelectasis or infiltrate. Suspect small bilateral effusions. Electronically Signed   By: Rolm Baptise M.D.   On: 11/21/2021 21:12   [place] EKG SITE RITE  Result Date: 11/21/2021 If Site Rite image not attached, placement could not be confirmed due to current cardiac rhythm.  ECHOCARDIOGRAM COMPLETE  Result Date: 11/20/2021    ECHOCARDIOGRAM REPORT   Patient Name:   [name] [name] Date of Exam: 11/20/2021 Medical Rec #:  [id number]     Height:       73.0 in Accession #:    [id number]    Weight:       217.2 lb Date of Birth:  [birth year]      BSA:          2.228 m Patient Age:    89 years      BP:           89/65 mmHg Patient Gender: M             HR:           65 bpm. Exam Location:  Inpatient Procedure: 2D Echo, Cardiac Doppler and Color Doppler Indications:    Endocarditis  History:        Patient has prior history of Echocardiogram examinations, most                 recent 05/26/2021. CAD, Prior CABG; Risk Factors:Hypertension and                 Sleep Apnea. S/P Maze. Aortic Valve: 26 mm Sapien prosthetic,                 stented (TAVR) valve is present in the                 aortic position. Procedure Date: 11/10/19.  Sonographer:    Clayton Lefort RDCS (AE) Referring Phys: [facility]  Sonographer Comments: Technically difficult study due to poor echo windows.  IMPRESSIONS  1. Left ventricular ejection fraction, by estimation, is 55 to 60%. The left ventricle has normal function. The left ventricle has no regional wall motion abnormalities. There is moderate concentric left ventricular hypertrophy. Left ventricular diastolic parameters are indeterminate.  2. Right ventricular systolic function is normal. The right ventricular size is normal. There  is normal pulmonary artery systolic pressure.  3. Left atrial size was moderately dilated.  4. The mitral valve is normal in structure. Trivial mitral valve regurgitation. No evidence of mitral stenosis.  5. Tricuspid valve regurgitation is moderate.  6. TAVR gradient essentially unchanged from 05/2021. Mild perivalvular regurgitation. The aortic valve has been repaired/replaced. Aortic valve regurgitation is mild. No aortic stenosis is present. Echo findings are consistent with normal structure and function of the aortic valve prosthesis. Aortic valve area, by VTI measures 1.62 cm. Aortic valve mean gradient measures 11.0 mmHg. Aortic valve Vmax measures 2.24 m/s.  7. Aortic dilatation noted. There is mild dilatation of the aortic root, measuring 40 mm.  8. The inferior vena cava is dilated in size with <50% respiratory variability, suggesting right atrial pressure of 15 mmHg. FINDINGS  Left Ventricle: Left ventricular ejection fraction, by estimation, is 55 to 60%. The left ventricle has normal function. The left ventricle has no regional wall motion abnormalities. The left ventricular internal cavity size was normal in size. There is  moderate concentric left ventricular hypertrophy. Left ventricular diastolic parameters are indeterminate. Right Ventricle: The right ventricular size is normal. No increase in right ventricular wall thickness. Right ventricular systolic function is normal. There is normal pulmonary artery systolic pressure. The tricuspid regurgitant velocity is 2.03 m/s, and  with an assumed right atrial  pressure of 15 mmHg, the estimated right ventricular systolic pressure is 50.5 mmHg. Left Atrium: Left atrial size was moderately dilated. Right Atrium: Right atrial size was normal in size. Pericardium: There is no evidence of pericardial effusion. Mitral Valve: The mitral valve is normal in structure. There is mild thickening of the mitral valve leaflet(s). Mild mitral annular calcification. Trivial mitral valve regurgitation. No evidence of mitral valve stenosis. Tricuspid Valve: The tricuspid valve is normal in structure. Tricuspid valve regurgitation is moderate . No evidence of tricuspid stenosis. Aortic Valve: TAVR gradient essentially unchanged from 05/2021. Mild perivalvular regurgitation. The aortic valve has been repaired/replaced. Aortic valve regurgitation is mild. No aortic stenosis is present. Aortic valve mean gradient measures 11.0 mmHg.  Aortic valve peak gradient measures 20.1 mmHg. Aortic valve area, by VTI measures 1.62 cm. There is a 26 mm Sapien prosthetic, stented (TAVR) valve present in the aortic position. Echo findings are consistent with normal structure and function of the aortic valve prosthesis. Pulmonic Valve: The pulmonic valve was normal in structure. Pulmonic valve regurgitation is not visualized. No evidence of pulmonic stenosis. Aorta: Aortic dilatation noted. There is mild dilatation of the aortic root, measuring 40 mm. Venous: The inferior vena cava is dilated in size with less than 50% respiratory variability, suggesting right atrial pressure of 15 mmHg. IAS/Shunts: No atrial level shunt detected by color flow Doppler.  LEFT VENTRICLE PLAX 2D LVIDd:         5.30 cm LVIDs:         3.60 cm LV PW:         1.30 cm LV IVS:        1.50 cm LVOT diam:     2.60 cm LV SV:         84 LV SV Index:   38 LVOT Area:     5.31 cm  RIGHT VENTRICLE            IVC RV Basal diam:  3.40 cm    IVC diam: 2.30 cm RV S prime:     8.59 cm/s TAPSE (M-mode): 1.0 cm LEFT ATRIUM  Index         RIGHT ATRIUM           Index LA diam:        3.70 cm 1.66 cm/m   RA Area:     21.00 cm LA Vol (A2C):   81.4 ml 36.53 ml/m  RA Volume:   60.10 ml  26.97 ml/m LA Vol (A4C):   84.3 ml 37.83 ml/m LA Biplane Vol: 85.0 ml 38.15 ml/m  AORTIC VALVE AV Area (Vmax):    2.04 cm AV Area (Vmean):   1.90 cm AV Area (VTI):     1.62 cm AV Vmax:           224.00 cm/s AV Vmean:          158.000 cm/s AV VTI:            0.519 m AV Peak Grad:      20.1 mmHg AV Mean Grad:      11.0 mmHg LVOT Vmax:         85.90 cm/s LVOT Vmean:        56.600 cm/s LVOT VTI:          0.158 m LVOT/AV VTI ratio: 0.30  AORTA Ao Root diam: 4.00 cm TRICUSPID VALVE TR Peak grad:   16.5 mmHg TR Vmax:        203.00 cm/s  SHUNTS Systemic VTI:  0.16 m Systemic Diam: 2.60 cm Skeet Latch MD Electronically signed by Skeet Latch MD Signature Date/Time: 11/20/2021/4:10:47 PM    Final    DG Foot 2 Views Right  Result Date: 11/19/2021 CLINICAL DATA:  [id number] severe sepsis and posterolateral right foot ulcer. EXAM: RIGHT FOOT - 2 VIEW COMPARISON:  The study of 11/02/2021 FINDINGS: There is mild generalized edema, greater posteriorly, slightly improved since November 2. There is no visible soft tissue gas. Vascular calcifications are present in the posterior plantar foot. There is normal bone mineralization. No evidence of fractures or destructive bone lesions. There is a sizable plantar calcaneal enthesophyte. There are mild features of degenerative arthrosis of the toe joints and first MTP joint, slight spurring of the tarsometatarsal joints. Other joints are maintained. There is no erosive arthropathy. No foreign body is visible. IMPRESSION: 1. Mild generalized edema, slightly improved since November 2. No visible soft tissue gas or foreign body. 2. No evidence of fractures or destructive bone lesions. 3. Degenerative changes and vascular calcifications. Electronically Signed   By: Telford Nab M.D.   On: 11/19/2021 01:34   US RENAL  Result  Date: 11/19/2021 CLINICAL DATA:  [id number] with sepsis secondary to UTI. EXAM: RENAL / URINARY TRACT ULTRASOUND COMPLETE COMPARISON:  Renal ultrasound 10/22/2020, CT without contrast 03/14/2021. FINDINGS: Right Kidney: Renal measurements: 11.9 x 6.6 x 6.6 cm = volume: 273.6 mL. Again noted is mild renal cortical thinning and slight increased renal cortical echogenicity consistent with medical renal disease. No mass, stones or hydronephrosis visualized. A simple cyst is again noted in the superior pole measuring 2.7 cm. Left Kidney: Renal measurements: 11.9 x 6.5 x 6.2 cm = volume: 250.8 mL. Again noted is mild renal cortical thinning and slight increased renal cortical echogenicity consistent with medical renal disease. No mass, stones or hydronephrosis visualized. The left kidney is less well seen than the right due to habitus and deep position. Bladder: The bladder has developed mild general thickening. There is layering debris within the bladder warranting correlation with urinalysis. Along the right and left lateral walls there is trabeculation of  the bladder surface which was not seen on 03/14/2021 CT. This can be seen with bladder hypertrophy but should be correlated with other imaging or direct visualization to exclude neoplasm. Other: None. IMPRESSION: 1. The bladder has developed mild general thickening. There is layering debris within the bladder. Along the right and left lateral walls there is trabeculation of the bladder surface which was not seen on 03/14/2021 CT. This can be seen with bladder hypertrophy but should be correlated with other imaging or direct visualization to exclude neoplasm. 2. Again noted is mild renal cortical thinning and slight increased renal cortical echogenicity consistent with medical renal disease. No progression in this is seen since the renal ultrasound of 10/22/2020. 3. 2.7 cm simple cyst upper pole right kidney. Electronically Signed   By: Telford Nab M.D.   On:  11/19/2021 01:30   DG Foot Complete Left  Result Date: 11/18/2021 CLINICAL DATA:  Questionable sepsis - evaluate for abnormality. Hypoxia, recent left second toe amputation with sepsis now. Rule out new osteomyelitis. EXAM: LEFT FOOT - COMPLETE 3+ VIEW COMPARISON:  11/02/2021 FINDINGS: Prior transmetatarsal amputation of the left 2nd toe. No definite changes of acute osteomyelitis at the amputation site or elsewhere within the left foot. No fracture, subluxation or dislocation. No soft tissue gas. IMPRESSION: Prior left 2nd toe amputation.  No visible acute bony abnormality. Electronically Signed   By: Rolm Baptise M.D.   On: 11/18/2021 20:14   DG Chest Port 1 View  Result Date: 11/18/2021 CLINICAL DATA:  Questionable sepsis EXAM: PORTABLE CHEST 1 VIEW COMPARISON:  Chest x-ray 11/02/2021 FINDINGS: Patient is status post TAVR. Sternotomy wires are again noted. The aorta is ectatic. The heart is enlarged, unchanged. There is no focal lung infiltrate, pleural effusion or pneumothorax. Surgical clips are seen in the left axilla. No acute fractures. IMPRESSION: 1. No active disease. 2. Cardiomegaly. 3. Status post TAVR. Electronically Signed   By: Ronney Asters M.D.   On: 11/18/2021 20:12   CT HEAD WO CONTRAST (5MM)  Result Date: 11/18/2021 CLINICAL DATA:  Mental status change. EXAM: CT HEAD WITHOUT CONTRAST TECHNIQUE: Contiguous axial images were obtained from the base of the skull through the vertex without intravenous contrast. RADIATION DOSE REDUCTION: This exam was performed according to the departmental dose-optimization program which includes automated exposure control, adjustment of the mA and/or kV according to patient size and/or use of iterative reconstruction technique. COMPARISON:  MRI brain 05/25/2021. CT angiogram head and neck 05/25/2021. FINDINGS: Brain: No evidence of acute infarction, hemorrhage, hydrocephalus, extra-axial collection or mass lesion/mass effect. There are areas of chronic  cortical infarct in the left frontal lobe left operculum corresponding to acute infarcts seen on 05/25/2021. There is stable mild periventricular white matter hypodensity, likely chronic small vessel ischemic change. Small old cerebellar infarcts are present similar to prior. Vascular: Atherosclerotic calcifications are present within the cavernous internal carotid arteries. Skull: Normal. Negative for fracture or focal lesion. Sinuses/Orbits: No acute finding. Other: None. IMPRESSION: 1. No acute intracranial process. If there is high clinical concern for small acute infarct, recommend further evaluation with MRI. 2. Chronic infarcts in the left frontal lobe and left operculum. 3. Stable mild chronic small vessel ischemic change of the white matter. Electronically Signed   By: Ronney Asters M.D.   On: 11/18/2021 19:46   VAS [place] UPPER EXTREMITY VENOUS DUPLEX  Result Date: 11/10/2021 UPPER VENOUS STUDY  Patient Name:  [name] [name]  Date of Exam:   11/10/2021 Medical Rec #: [id number]  Accession #:    [id number] Date of Birth: [birth year]       Patient Gender: M Patient Age:   27 years Exam Location:  [facility] Procedure:      VAS [place] UPPER EXTREMITY VENOUS DUPLEX Referring Phys: Annamaria Boots XU --------------------------------------------------------------------------------  Indications: Right arm pain and swelling, s/p IV Limitations: Bandages. Comparison Study: No prior studies. Performing Technologist: Darlin Coco RDMS, RVT  Examination Guidelines: A complete evaluation includes B-mode imaging, spectral Doppler, color Doppler, and power Doppler as needed of all accessible portions of each vessel. Bilateral testing is considered an integral part of a complete examination. Limited examinations for reoccurring indications may be performed as noted.  Right Findings: +----------+------------+---------+-----------+----------+--------------------+ RIGHT     CompressiblePhasicitySpontaneousProperties       Summary        +----------+------------+---------+-----------+----------+--------------------+ IJV           Full       Yes       Yes                                   +----------+------------+---------+-----------+----------+--------------------+ Subclavian               Yes       Yes                                   +----------+------------+---------+-----------+----------+--------------------+ Axillary      Full                                                       +----------+------------+---------+-----------+----------+--------------------+ Brachial      Full                                                       +----------+------------+---------+-----------+----------+--------------------+ Radial        Full                                                       +----------+------------+---------+-----------+----------+--------------------+ Ulnar         Full                                                       +----------+------------+---------+-----------+----------+--------------------+ Cephalic      None       No        No                 Acute - proximal  forearm        +----------+------------+---------+-----------+----------+--------------------+ Basilic       None       No        No               Acute- distal upper                                                              arm          +----------+------------+---------+-----------+----------+--------------------+  Left Findings: +----------+------------+---------+-----------+----------+-------+ LEFT      CompressiblePhasicitySpontaneousPropertiesSummary +----------+------------+---------+-----------+----------+-------+ Subclavian               Yes       Yes                      +----------+------------+---------+-----------+----------+-------+  Summary:  Right: No evidence of deep vein thrombosis in the  upper extremity. Findings consistent with acute superficial vein thrombosis involving the right basilic vein and right cephalic vein.  Left: No evidence of thrombosis in the subclavian.  *See table(s) above for measurements and observations.  Diagnosing physician: Monica Martinez MD Electronically signed by Monica Martinez MD on 11/10/2021 at 4:40:44 PM.    Final    PERIPHERAL VASCULAR CATHETERIZATION  Result Date: 11/06/2021 Table formatting from the original result was not included. Images from the original result were not included. DATE OF SERVICE: 11/06/2021  PATIENT:  [name] [name]  88 y.o. male  PRE-OPERATIVE DIAGNOSIS:  Atherosclerosis of native arteries of left lower extremity causing ulceration  POST-OPERATIVE DIAGNOSIS:  Same  PROCEDURE:  1) Ultrasound guided right common femoral artery access 2) Aortogram 3) Left lower extremity angiogram with third order cannulation (106m total contrast) 4) Left femoropopliteal angioplasty and stenting (7x430mEluvia) 5) Conscious sedation (45 minutes)  SURGEON:  ThYevonne AlineHaStanford BreedMD  ASSISTANT: none  ANESTHESIA:   local and IV sedation  ESTIMATED BLOOD LOSS: minimal  LOCAL MEDICATIONS USED:  LIDOCAINE  COUNTS: confirmed correct.  PATIENT DISPOSITION:  PACU - hemodynamically stable.  Delay start of Pharmacological VTE agent (>24hrs) due to surgical blood loss or risk of bleeding: no  INDICATION FOR PROCEDURE: DaBEARL TALARICOs a [age].o. male with left second toe ulceration and non-invasive evidence of peripheral arterial disease. After careful discussion of risks, benefits, and alternatives the patient was offered angiography. The patient understood and wished to proceed.  OPERATIVE FINDINGS: Terminal aorta and iliac arteries: Small juxtarenal aneurysm Tortuous Widely patent  Left lower extremity: Common femoral artery: diffuse disease, but patent. No stenosis >50%. Profunda femoris artery: diffuse disease, but patent. Superficial femoral artery: diffuse disease,  but patent. >80% stenosis at Hunter's canal. Popliteal artery: >80% stenosis at Hunter's canal. Patent beyond this lesion without stenosis. Anterior tibial artery: occludes several centimeters beyond its origin Tibioperoneal trunk: diffuse disease, but patent. Peroneal artery: small, tapers to occlusion in mid calf Posterior tibial artery: dominant. Widely patent. Fills the foot. Pedal circulation: fills via PT. Less disease than anticipated.  GLASS score. FP 2. IP 0. Stage 1 disease.  WIfI score calculated based on clinical exam and non-invasive measurements. 2 / 1 / 1. Stage 3.  DESCRIPTION OF PROCEDURE: After identification of the patient in the pre-operative holding area, the patient was transferred to the operating  room. The patient was positioned supine on the operating room table. Anesthesia was induced. The groins was prepped and draped in standard fashion. A surgical pause was performed confirming correct patient, procedure, and operative location.  The right groin was anesthetized with subcutaneous injection of 1% lidocaine. Using ultrasound guidance, the right common femoral artery was accessed with micropuncture technique. Fluoroscopy was used to confirm cannulation over the femoral head. The 68F sheath was upsized to 74F.  A Benson wire was advanced into the distal aorta. Over the wire an omni flush catheter was advanced to the level of L2. Aortogram was performed - see above for details.  The left common iliac artery was selected with an omniflush catheter and Benson guidewire. The wire was advanced into the common femoral artery. Over the wire the omni flush catheter was advanced into the external iliac artery. Selective angiography was performed - see above for details.  The decision was made to intervene. The patient was heparinized with 10,000 units of heparin. The 74F sheath was exchanged for a 57F x 45cm sheath. Selective angiography of the left lower extremity was performed prior to intervention.   The lesions were treated with: Left femoropopliteal angioplasty and stenting (7x64m Eluvia)  Completion angiography revealed: Resolution of L SFA / AKPA stenosis at Hunter's canal.  A perclose device was used to close the arteriotomy. Hemostasis was excellent upon completion.  Conscious sedation was administered with the use of IV fentanyl and midazolam under continuous physician and nurse monitoring.  Heart rate, blood pressure, and oxygen saturation were continuously monitored.  Total sedation time was 45 minutes  Upon completion of the case instrument and sharps counts were confirmed correct. The patient was transferred to the PACU in good condition. I was present for all portions of the procedure.  PLAN: ASA '81mg'$  PO QD. High intensity statin therapy. OK to resume anticoagulation 8 hours post procedure. No need for DAPT if patient will be anticoagulated with DOAC going forward. Optimized from a vascular perspective. Can proceed with toe amputation at any time.  TYevonne Aline HStanford Breed MD [facility] LLCPhone Number: ([phone]/06/2021 8:39 AM    VAS UKoreaABI WITH/WO TBI  Result Date: 11/02/2021  LOWER EXTREMITY DOPPLER STUDY Patient Name:  DGENTRY PILSON Date of Exam:   11/02/2021 Medical Rec #: [id number]     Accession #:    [id number] of Birth: 601-17-1928      Patient Gender: M Patient Age:   89years [facility]      [facility] [place]ABI WITH/WO TBI Referring Phys: KMerleen NicelyFORD --------------------------------------------------------------------------------  Indications: Ulceration. High Risk Factors: Hypertension, hyperlipidemia, past history of smoking,                    coronary artery disease, prior CVA. Other Factors: S/P CABG x 3, S/P TAVR, CKD, AAA, Afib.  Vascular Interventions: Angiogram and posterior tibial balloon angioplasty of                         RLE (10/26/20). Comparison Study: Previous exam 03/24/21 WNL Performing  Technologist: HRogelia RohrerRVT, RDMS  Examination Guidelines: A complete evaluation includes at minimum, Doppler waveform signals and systolic blood pressure reading at the level of bilateral brachial, anterior tibial, and posterior tibial arteries, when vessel segments are accessible. Bilateral testing is considered an integral part of a complete examination. Photoelectric Plethysmograph (PPG)  waveforms and toe systolic pressure readings are included as required and additional duplex testing as needed. Limited examinations for reoccurring indications may be performed as noted.  ABI Findings: +---------+------------------+-----+----------+--------+ Right    Rt Pressure (mmHg)IndexWaveform  Comment  +---------+------------------+-----+----------+--------+ Brachial 167                    triphasic          +---------+------------------+-----+----------+--------+ PTA      103               0.62 monophasic         +---------+------------------+-----+----------+--------+ DP       109               0.65 monophasic         +---------+------------------+-----+----------+--------+ Great Toe75                0.45 Abnormal           +---------+------------------+-----+----------+--------+ +---------+------------------+-----+----------+-------+ Left     Lt Pressure (mmHg)IndexWaveform  Comment +---------+------------------+-----+----------+-------+ Brachial 167                    triphasic         +---------+------------------+-----+----------+-------+ PTA      203               1.22 monophasic        +---------+------------------+-----+----------+-------+ DP       158               0.95 monophasic        +---------+------------------+-----+----------+-------+ Great Toe52                0.31 Abnormal          +---------+------------------+-----+----------+-------+ +-------+-----------+-----------+------------+------------+ ABI/TBIToday's ABIToday's TBIPrevious  ABIPrevious TBI +-------+-----------+-----------+------------+------------+ Right  0.65       0.45       1.09        0.74         +-------+-----------+-----------+------------+------------+ Left   1.22       0.31       1.19        0.76         +-------+-----------+-----------+------------+------------+ Bilateral TBIs appear decreased. Right ABIs appear decreased.  Summary: Right: Resting right ankle-brachial index indicates moderate right lower extremity arterial disease. The right toe-brachial index is abnormal. Left: The left toe-brachial index is abnormal. Although ankle brachial indices are within normal limits (0.95-1.29), arterial Doppler waveforms at the ankle suggest some component of arterial occlusive disease. *See table(s) above for measurements and observations.  Electronically signed by Jamelle Haring on 11/02/2021 at 4:05:01 PM.    Final    DG Foot Complete Right  Result Date: 11/02/2021 CLINICAL DATA:  Infection.  Second toe. EXAM: RIGHT FOOT COMPLETE - 3+ VIEW COMPARISON:  None Available. FINDINGS: There is a soft tissue abnormality along the tip of the second toe distal phalanx. There is no definite evidence of cortical irregularity on the provided views. No evidence of subcutaneous gas. There are enthesopathic changes at the calcaneus. There are multifocal degenerative changes at the forefoot and hindfoot. Vascular calcifications are present. IMPRESSION: Soft tissue abnormality along the tip of the second toe distal phalanx without definite evidence of cortical irregularity. However, given clinical concerns for osseous extension, osteomyelitis is not excluded. Consider further evaluation with MRI if more definitive characterization is clinically warranted. Electronically Signed   By: Marin Roberts M.D.   On: 11/02/2021 12:41  DG Foot Complete Left  Result Date: 11/02/2021 CLINICAL DATA:  Second toe wound. EXAM: LEFT FOOT - COMPLETE 3+ VIEW COMPARISON:  October 05, 2021.  FINDINGS: There is no evidence of fracture or dislocation. There is no evidence of arthropathy or other focal bone abnormality. Soft tissues are unremarkable. IMPRESSION: No acute abnormality seen in the left foot. No definite lytic destruction is seen to suggest osteomyelitis. Electronically Signed   By: Marijo Conception M.D.   On: 11/02/2021 11:54   DG Chest 2 View  Result Date: 11/02/2021 CLINICAL DATA:  Possible sepsis with second toe wound, initial encounter EXAM: CHEST - 2 VIEW COMPARISON:  10/22/2020 FINDINGS: Cardiac shadow is stable. Postsurgical changes are noted as well as prior TAVR. The lungs are well aerated bilaterally. No focal infiltrate or sizable effusion is noted. Changes of prior vertebral augmentation at L1 are noted. IMPRESSION: No active cardiopulmonary disease. Electronically Signed   By: Inez Catalina M.D.   On: 11/02/2021 11:16     Subjective: obtunded  Discharge Exam: BP 124/85 (BP Location: Right Wrist)   Pulse 92   Temp 97.6 F (36.4 C) (Axillary)   Resp 16   Ht '6\' 1"'$  (1.854 m)   Wt 98.5 kg   SpO2 100%   BMI 28.65 kg/m   General: unresponsive    The results of significant diagnostics from this hospitalization (including imaging, microbiology, ancillary and laboratory) are listed below for reference.     Microbiology: Recent Results (from the past 240 hour(s))  Blood Culture (routine x 2)     Status: Abnormal   Collection Time: 11/18/21  9:14 PM   Specimen: BLOOD  Result Value Ref Range Status   Specimen Description BLOOD LEFT ANTECUBITAL  Final   Special Requests   Final    BOTTLES DRAWN AEROBIC AND ANAEROBIC Blood Culture results may not be optimal due to an inadequate volume of blood received in culture bottles   Culture  Setup Time   Final    GRAM POSITIVE COCCI IN CLUSTERS IN BOTH AEROBIC AND ANAEROBIC BOTTLES Organism ID to follow CRITICAL RESULT CALLED TO, READ BACK BY AND VERIFIED WITH: [place] [id number] AT 1018 BY EC Performed  at [facility], [place] [address] [place], [place] [address]    Culture STAPHYLOCOCCUS AUREUS (A)  Final   Report Status 11/21/2021 FINAL  Final   Organism ID, Bacteria STAPHYLOCOCCUS AUREUS  Final      Susceptibility   Staphylococcus aureus - MIC*    CIPROFLOXACIN <=0.5 SENSITIVE Sensitive     ERYTHROMYCIN <=0.25 SENSITIVE Sensitive     GENTAMICIN <=0.5 SENSITIVE Sensitive     OXACILLIN 0.5 SENSITIVE Sensitive     TETRACYCLINE <=1 SENSITIVE Sensitive     VANCOMYCIN 1 SENSITIVE Sensitive     TRIMETH/SULFA <=10 SENSITIVE Sensitive     CLINDAMYCIN <=0.25 SENSITIVE Sensitive     RIFAMPIN <=0.5 SENSITIVE Sensitive     Inducible Clindamycin NEGATIVE Sensitive     * STAPHYLOCOCCUS AUREUS  Blood Culture ID Panel (Reflexed)     Status: Abnormal   Collection Time: 11/18/21  9:14 PM  Result Value Ref Range Status   Enterococcus faecalis NOT DETECTED NOT DETECTED Final   Enterococcus Faecium NOT DETECTED NOT DETECTED Final   Listeria monocytogenes NOT DETECTED NOT DETECTED Final   Staphylococcus species DETECTED (A) NOT DETECTED Final    Comment: CRITICAL RESULT CALLED TO, READ BACK BY AND VERIFIED WITH: PHARMD LAUREN BELL [id number] AT 1018 BY EC  Staphylococcus aureus (BCID) DETECTED (A) NOT DETECTED Final    Comment: CRITICAL RESULT CALLED TO, READ BACK BY AND VERIFIED WITH: [place] [phone] AT 9678 BY EC    Staphylococcus epidermidis NOT DETECTED NOT DETECTED Final   Staphylococcus lugdunensis NOT DETECTED NOT DETECTED Final   Streptococcus species NOT DETECTED NOT DETECTED Final   Streptococcus agalactiae NOT DETECTED NOT DETECTED Final   Streptococcus pneumoniae NOT DETECTED NOT DETECTED Final   Streptococcus pyogenes NOT DETECTED NOT DETECTED Final   A.calcoaceticus-baumannii NOT DETECTED NOT DETECTED Final   Bacteroides fragilis NOT DETECTED NOT DETECTED Final   Enterobacterales NOT DETECTED NOT DETECTED Final   Enterobacter cloacae complex NOT DETECTED NOT  DETECTED Final   Escherichia coli NOT DETECTED NOT DETECTED Final   Klebsiella aerogenes NOT DETECTED NOT DETECTED Final   Klebsiella oxytoca NOT DETECTED NOT DETECTED Final   Klebsiella pneumoniae NOT DETECTED NOT DETECTED Final   Proteus species NOT DETECTED NOT DETECTED Final   Salmonella species NOT DETECTED NOT DETECTED Final   Serratia marcescens NOT DETECTED NOT DETECTED Final   Haemophilus influenzae NOT DETECTED NOT DETECTED Final   Neisseria meningitidis NOT DETECTED NOT DETECTED Final   Pseudomonas aeruginosa NOT DETECTED NOT DETECTED Final   Stenotrophomonas maltophilia NOT DETECTED NOT DETECTED Final   Candida albicans NOT DETECTED NOT DETECTED Final   Candida auris NOT DETECTED NOT DETECTED Final   Candida glabrata NOT DETECTED NOT DETECTED Final   Candida krusei NOT DETECTED NOT DETECTED Final   Candida parapsilosis NOT DETECTED NOT DETECTED Final   Candida tropicalis NOT DETECTED NOT DETECTED Final   Cryptococcus neoformans/gattii NOT DETECTED NOT DETECTED Final   Meth resistant mecA/C and MREJ NOT DETECTED NOT DETECTED Final    Comment: Performed at [facility], [address] [place], [place] [address]  Resp Panel by RT-PCR (Flu A&B, Covid) Anterior Nasal Swab     Status: None   Collection Time: 11/18/21  9:42 PM   Specimen: Anterior Nasal Swab  Result Value Ref Range Status   SARS Coronavirus 2 by RT PCR NEGATIVE NEGATIVE Final    Comment: (NOTE) SARS-CoV-2 target nucleic acids are NOT DETECTED.  The SARS-CoV-2 RNA is generally detectable in upper respiratory specimens during the acute phase of infection. The lowest concentration of SARS-CoV-2 viral copies this assay can detect is 138 copies/mL. A negative result does not preclude SARS-Cov-2 infection and should not be used as the sole basis for treatment or other patient management decisions. A negative result may occur with  improper specimen collection/handling, submission of specimen other than  nasopharyngeal swab, presence of viral mutation(s) within the areas targeted by this assay, and inadequate number of viral copies(<138 copies/mL). A negative result must be combined with clinical observations, patient history, and epidemiological information. The expected result is Negative.  Fact Sheet for Patients:  EntrepreneurPulse.com.au  Fact Sheet for Healthcare Providers:  IncredibleEmployment.be  This test is no t yet approved or cleared by the [place] FDA and  has been authorized for detection and/or diagnosis of SARS-CoV-2 by FDA under an Emergency Use Authorization (EUA). This EUA will remain  in effect (meaning this test can be used) for the duration of the COVID-19 declaration under Section 564(b)(1) of the Act, 21 U.S.C.section 360bbb-3(b)(1), unless the authorization is terminated  or revoked sooner.       Influenza A by PCR NEGATIVE NEGATIVE Final   Influenza B by PCR NEGATIVE NEGATIVE Final    Comment: (NOTE) The Xpert Xpress SARS-CoV-2/FLU/RSV  plus assay is intended as an aid in the diagnosis of influenza from Nasopharyngeal swab specimens and should not be used as a sole basis for treatment. Nasal washings and aspirates are unacceptable for Xpert Xpress SARS-CoV-2/FLU/RSV testing.  Fact Sheet for Patients: EntrepreneurPulse.com.au  Fact Sheet for Healthcare Providers: IncredibleEmployment.be  This test is not yet approved or cleared by the [place] FDA and has been authorized for detection and/or diagnosis of SARS-CoV-2 by FDA under an Emergency Use Authorization (EUA). This EUA will remain in effect (meaning this test can be used) for the duration of the COVID-19 declaration under Section 564(b)(1) of the Act, 21 U.S.C. section 360bbb-3(b)(1), unless the authorization is terminated or revoked.  Performed at [facility], [place] [address], [place], [place] [address]    Urine Culture     Status: Abnormal   Collection Time: 11/18/21  9:53 PM   Specimen: In/Out Cath Urine  Result Value Ref Range Status   Specimen Description IN/OUT CATH URINE  Final   Special Requests   Final    NONE Performed at [facility], [place] [address] [place], [place] [address]    Culture (A)  Final    >=100,000 COLONIES/mL PSEUDOMONAS AERUGINOSA 20,000 COLONIES/mL ESCHERICHIA COLI    Report Status 11/21/2021 FINAL  Final   Organism ID, Bacteria PSEUDOMONAS AERUGINOSA (A)  Final   Organism ID, Bacteria ESCHERICHIA COLI (A)  Final      Susceptibility   Escherichia coli - MIC*    AMPICILLIN <=2 SENSITIVE Sensitive     CEFAZOLIN <=4 SENSITIVE Sensitive     CEFEPIME <=0.12 SENSITIVE Sensitive     CEFTRIAXONE <=0.25 SENSITIVE Sensitive     CIPROFLOXACIN <=0.25 SENSITIVE Sensitive     GENTAMICIN <=1 SENSITIVE Sensitive     IMIPENEM <=0.25 SENSITIVE Sensitive     NITROFURANTOIN <=16 SENSITIVE Sensitive     TRIMETH/SULFA <=20 SENSITIVE Sensitive     AMPICILLIN/SULBACTAM <=2 SENSITIVE Sensitive     PIP/TAZO <=4 SENSITIVE Sensitive     * 20,000 COLONIES/mL ESCHERICHIA COLI   Pseudomonas aeruginosa - MIC*    CEFTAZIDIME 2 SENSITIVE Sensitive     CIPROFLOXACIN 0.5 SENSITIVE Sensitive     GENTAMICIN <=1 SENSITIVE Sensitive     IMIPENEM >=16 RESISTANT Resistant     PIP/TAZO <=4 SENSITIVE Sensitive     CEFEPIME 2 SENSITIVE Sensitive     * >=100,000 COLONIES/mL PSEUDOMONAS AERUGINOSA  Blood Culture (routine x 2)     Status: None   Collection Time: 11/19/21  4:43 AM   Specimen: BLOOD RIGHT ARM  Result Value Ref Range Status   Specimen Description BLOOD RIGHT ARM  Final   Special Requests   Final    BOTTLES DRAWN AEROBIC ONLY Blood Culture adequate volume   Culture   Final    NO GROWTH 5 DAYS Performed at [facility], [address], [place], [place] [address]    Report Status 11/24/2021 FINAL  Final  MRSA Next Gen by PCR, Nasal     Status: None   Collection  Time: 11/19/21  7:47 PM   Specimen: Nasal Mucosa; Nasal Swab  Result Value Ref Range Status   MRSA by PCR Next Gen NOT DETECTED NOT DETECTED Final    Comment: (NOTE) The GeneXpert MRSA Assay (FDA approved for NASAL specimens only), is one component of a comprehensive MRSA colonization surveillance program. It is not intended to diagnose MRSA infection nor to guide or monitor treatment for MRSA infections. Test performance is  not FDA approved in patients less than 43 years old. Performed at [facility], [place] [address] [place], [place] [address]   C Difficile Quick Screen w PCR reflex     Status: Abnormal   Collection Time: 11/20/21  4:35 AM   Specimen: STOOL  Result Value Ref Range Status   C Diff antigen POSITIVE (A) NEGATIVE Final   C Diff toxin NEGATIVE NEGATIVE Final   C Diff interpretation Results are indeterminate. See PCR results.  Final    Comment: Performed at [facility], [place] [address] [place], [place] [address]  C. Diff by PCR, Reflexed     Status: None   Collection Time: 11/20/21  4:35 AM  Result Value Ref Range Status   Toxigenic C. Difficile by PCR NEGATIVE NEGATIVE Final    Comment: Patient is colonized with non toxigenic C. difficile. May not need treatment unless significant symptoms are present. Performed at [facility], [place] [address], [place], [place] [address]   Culture, blood (Routine X 2) w Reflex to ID Panel     Status: Abnormal   Collection Time: 11/20/21  7:44 AM   Specimen: BLOOD RIGHT HAND  Result Value Ref Range Status   Specimen Description BLOOD RIGHT HAND  Final   Special Requests IN PEDIATRIC BOTTLE Blood Culture adequate volume  Final   Culture  Setup Time   Final    GRAM POSITIVE COCCI IN CLUSTERS IN PEDIATRIC BOTTLE CRITICAL VALUE NOTED.  VALUE IS CONSISTENT WITH PREVIOUSLY REPORTED AND CALLED VALUE.    Culture (A)  Final    STAPHYLOCOCCUS AUREUS SUSCEPTIBILITIES PERFORMED ON PREVIOUS CULTURE WITHIN THE LAST 5  DAYS. Performed at [facility], [place] [address] [place], [place] [address]    Report Status 11/22/2021 FINAL  Final  Culture, blood (Routine X 2) w Reflex to ID Panel     Status: Abnormal   Collection Time: 11/20/21  8:18 AM   Specimen: BLOOD RIGHT ARM  Result Value Ref Range Status   Specimen Description BLOOD RIGHT ARM  Final   Special Requests IN PEDIATRIC BOTTLE Blood Culture adequate volume  Final   Culture  Setup Time   Final    GRAM POSITIVE COCCI IN CLUSTERS IN PEDIATRIC BOTTLE Gram Stain Report Called to,Read Back By and Verified With: PHARMD ABBOTT 11/21/21 @ 0038 BY AB CRITICAL VALUE NOTED.  VALUE IS CONSISTENT WITH PREVIOUSLY REPORTED AND CALLED VALUE.    Culture (A)  Final    STAPHYLOCOCCUS AUREUS SUSCEPTIBILITIES PERFORMED ON PREVIOUS CULTURE WITHIN THE LAST 5 DAYS. Performed at [facility], [place] [address], [place], [place] [address]    Report Status 11/21/2021 FINAL  Final  Culture, blood (Routine X 2) w Reflex to ID Panel     Status: None (Preliminary result)   Collection Time: 11/23/21  9:32 AM   Specimen: BLOOD RIGHT HAND  Result Value Ref Range Status   Specimen Description BLOOD RIGHT HAND  Final   Special Requests   Final    BOTTLES DRAWN AEROBIC AND ANAEROBIC Blood Culture adequate volume   Culture   Final    NO GROWTH < 24 HOURS Performed at [facility], [place] [address] [place], [place] [address]    Report Status PENDING  Incomplete  Culture, blood (Routine X 2) w Reflex to ID Panel     Status: None (Preliminary result)   Collection Time: 11/23/21  9:37 AM   Specimen: BLOOD RIGHT HAND  Result Value Ref Range Status   Specimen Description BLOOD RIGHT HAND  Final   Special Requests   Final    BOTTLES DRAWN AEROBIC AND ANAEROBIC Blood Culture adequate volume   Culture   Final    NO GROWTH < 24 HOURS Performed at [facility], [address] [place], [place] [address]    Report Status PENDING  Incomplete     Labs: Basic  Metabolic Panel: Recent Labs  Lab 11/19/21 0433 11/19/21 1516 11/21/21 0343 11/23/21 0536 11/24/21 0541  NA 135 134* 141 149* 150*  K 4.3 3.8 3.5 3.2* 3.0*  CL 98 103 109 119* 115*  CO2 17* 19* 17* 17* 21*  GLUCOSE 137* 144* 91 180* 195*  BUN 83* 81* 73* 70* 73*  CREATININE 4.05* 3.70* 3.00* 2.82* 2.71*  CALCIUM 8.4* 7.9* 8.2* 9.0 9.5  MG  --   --  1.9 2.1  --    Liver Function Tests: Recent Labs  Lab 11/19/21 0433 11/19/21 1516 11/21/21 0343 11/23/21 0536 11/24/21 0541  AST 90* 84* 40 24 17  ALT 44 40 '18 12 10  '$ ALKPHOS 207* 209* 140* 138* 120  BILITOT 1.0 0.8 1.3* 2.9* 2.6*  PROT 5.6* 5.8* 5.4* 5.7* 5.7*  ALBUMIN 1.6* 1.6* 1.9* 1.8* 1.7*   CBC: Recent Labs  Lab 11/18/21 2029 11/18/21 2122 11/20/21 0410 11/21/21 0343 11/22/21 0435 11/23/21 0536 11/24/21 0541  WBC 17.5*   < > 17.9* 9.7 6.3 5.3 6.8  NEUTROABS 16.3*  --   --   --  5.0  --   --   HGB 7.8*   < > 7.3* 7.2* 6.5* 8.0* 8.3*  HCT 25.6*   < > 24.3* 22.2* 20.2* 25.7* 26.6*  MCV 90.8   < > 92.4 88.1 89.8 90.5 90.5  PLT 171   < > 106* 67* 52* 42* 40*   < > = values in this interval not displayed.   CBG: Recent Labs  Lab 11/19/21 1133  GLUCAP 143*   Hgb A1c No results for input(s): "HGBA1C" in the last 72 hours. Lipid Profile No results for input(s): "CHOL", "HDL", "LDLCALC", "TRIG", "CHOLHDL", "LDLDIRECT" in the last 72 hours. Thyroid function studies No results for input(s): "TSH", "T4TOTAL", "T3FREE", "THYROIDAB" in the last 72 hours.  Invalid input(s): "FREET3" Urinalysis    Component Value Date/Time   COLORURINE RED (A) 11/22/2021 0649   APPEARANCEUR TURBID (A) 11/22/2021 0649   LABSPEC  11/22/2021 0649    TEST NOT REPORTED DUE TO COLOR INTERFERENCE OF URINE PIGMENT   PHURINE  11/22/2021 0649    TEST NOT REPORTED DUE TO COLOR INTERFERENCE OF URINE PIGMENT   GLUCOSEU (A) 11/22/2021 0649    TEST NOT REPORTED DUE TO COLOR INTERFERENCE OF URINE PIGMENT   HGBUR (A) 11/22/2021 0649    TEST  NOT REPORTED DUE TO COLOR INTERFERENCE OF URINE PIGMENT   BILIRUBINUR (A) 11/22/2021 0649    TEST NOT REPORTED DUE TO COLOR INTERFERENCE OF URINE PIGMENT   KETONESUR (A) 11/22/2021 0649    TEST NOT REPORTED DUE TO COLOR INTERFERENCE OF URINE PIGMENT   PROTEINUR (A) 11/22/2021 0649    TEST NOT REPORTED DUE TO COLOR INTERFERENCE OF URINE PIGMENT   UROBILINOGEN 1.0 03/22/2012 0845   NITRITE (A) 11/22/2021 0649    TEST NOT REPORTED DUE TO COLOR INTERFERENCE OF URINE PIGMENT   LEUKOCYTESUR (A) 11/22/2021 0649    TEST NOT REPORTED DUE TO COLOR INTERFERENCE OF URINE PIGMENT    FURTHER DISCHARGE INSTRUCTIONS:  Get Medicines reviewed and adjusted: Please take all your medications with you for your next visit with your Primary MD   Laboratory/radiological data: Please request your Primary MD to go over all hospital tests and procedure/radiological results at the follow up, please ask your Primary MD to get all Hospital records sent to his/her office.   In some cases, they will be blood work, cultures and biopsy results pending at the time of your discharge. Please request that your primary care M.D. goes through all the records of your hospital data and follows up on these results.   Also Note the following: If you experience worsening of your admission symptoms, develop shortness of breath, life threatening emergency, suicidal or homicidal thoughts you must seek medical attention immediately by calling 911 or calling your MD immediately  if symptoms less severe.   You must read complete instructions/literature along with all the possible adverse reactions/side effects for all the Medicines you take and that have been prescribed to you. Take any new Medicines after you have completely understood and accpet all the possible adverse reactions/side effects.    Do not drive when taking Pain medications or sleeping medications (Benzodaizepines)   Do not take more than prescribed Pain, Sleep and  Anxiety Medications. It is not advisable to combine anxiety,sleep and pain medications without talking with your primary care practitioner   Special Instructions: If you have smoked or chewed Tobacco  in the last 2 yrs please stop smoking, stop any regular Alcohol  and or any Recreational drug use.   Wear Seat belts while driving.   Please note: You were cared for by a hospitalist during your hospital stay. Once you are discharged, your primary care physician will handle any further medical issues. Please note that NO REFILLS for any discharge medications will be authorized once you are discharged, as it is imperative that you return to your primary care physician (or establish a relationship with a primary care physician if you do not have one) for your post hospital discharge needs so that they can reassess your need for medications and monitor your lab values.  Time coordinating discharge: 35 minutes  SIGNED:  Marzetta Board, MD, PhD 11/24/2021, 12:14 PM

## 2021-11-24 NOTE — Progress Notes (Signed)
Gave report to [place] at [facility]. All questions answered.

## 2021-11-24 NOTE — Progress Notes (Deleted)
PROGRESS NOTE  [name] [name]:[id number] DOB: Feb 27, 1926 DOA: 11/18/2021 PCP: [name], MD   LOS: 5 days   Brief Narrative / Interim history: 88 year old male with history of PVD status post femoropopliteal angioplasty and stenting on 11/6, bilateral foot wounds status post second ary amputation on 11/8, dementia, CAD, CKD 3B, who came into the hospital with septic shock due to MSSA bacteremia, initially admitted to the ICU and eventually transferred to the hospitalist service on 11/22.  He was also found to have a Pseudomonas UTI, acute kidney injury due to shock.  Subjective / 24h Interval events: He is obtunded this morning  Assesement and Plan: Principal Problem:   Severe sepsis ([place]) Active Problems:   Sepsis secondary to UTI ([place])   CKD (chronic kidney disease) stage 3, GFR 30-59 ml/min (HCC)   AKI (acute kidney injury) ([place])   Dementia (HCC)   Acute metabolic encephalopathy   Skin ulcers of foot, bilateral (HCC)   CAD (coronary artery disease), native coronary artery   Paroxysmal atrial fibrillation (HCC)   Chronic diastolic CHF (congestive heart failure) (HCC)   Polymyalgia rheumatica (HCC)   History of CVA with residual deficit   Essential hypertension   OSA (obstructive sleep apnea)   Septic shock ([place])  Principal problem Septic shock due to MSSA bacteremia, Pseudomonas UTI-ID consulted and following.  His blood cultures were positive for MSSA.  Urine cultures positive for Pseudomonas as well as E. coli, pansensitive.  Currently on nafcillin per infectious disease. -2D echo done 11/20 shows an EF of 55 to 60%, RV was normal.  Active problems Goals of care-patient with extremely poor prognosis given septic shock, new embolic strokes, worsening renal failure, worsening mental status, hyponatremia, poor p.o. intake.  Discussed over the phone yesterday with the wife as well as with daughter Rodena Piety, his current condition may not be survivable, and I think it is very  unlikely that he will regain some quality of life.  I think comfort approach and hospice is entirely appropriate at this point  Acute CVA-MRI done 11/21 shows several areas of acute infarct in the right posterior frontal lobe, right parietal cortex and right posterior frontal lobe white matter.  Looks embolic in nature.  Discussed with neurology, Dr. Leonel Ramsay, and his CVA may be from his A-fib or septic emboli given bacteremia.  For now he recommends holding anticoagulation for a week, and resume Eliquis on 11/29 pending goals of care meeting  AKI on CKD 3A-baseline creatinine 1.3-1.5, creatinine on presentation was 4.25.  Acute kidney injury is likely in the setting of ATN in settings of shock.  Creatinine gradually improving  PAF -resume metoprolol when able to take p.o., for now keep on IV metoprolol   AS status post TAVR-most recent 2D echo shows stability.  Hypernatremia-place on D5W, sodium worse today, continue fluids  PVD-status post recent femoropopliteal stent placement on 11/6  CAD with history of CABG- appears stable  Acute metabolic encephalopathy with underlying baseline dementia - noted  PMR-he is on prednisone at home, came in with shock, probably has a degree of iatrogenic adrenal insufficiency.  Placed on IV steroids until he is able to reliably take p.o.  Anemia of acute illness, chronic kidney disease -hemoglobin down to 6.5 on 11/22, transfuse unit of packed blood cells.  No bleeding.  Hemoglobin now stable  Thrombocytopenia-likely in the setting of septic shock, platelets continue to drop.  No bleeding  Bilateral foot wounds-status post left second ray amputation on 11/8  Scheduled Meds:  Chlorhexidine Gluconate Cloth  6 each Topical Daily   leptospermum manuka honey  1 Application Topical Daily   methylPREDNISolone (SOLU-MEDROL) injection  40 mg Intravenous Daily   metoprolol tartrate  2.5 mg Intravenous Q6H   mouth rinse  15 mL Mouth Rinse 4 times per day    pantoprazole (PROTONIX) IV  40 mg Intravenous Daily   senna  1 tablet Oral QHS   sodium chloride flush  10-40 mL Intracatheter Q12H   sodium chloride flush  10-40 mL Intracatheter Q12H   Continuous Infusions:  sodium chloride 10 mL/hr at 11/21/21 2200   sodium chloride Stopped (11/19/21 0808)   dextrose 75 mL/hr at 11/23/21 2236   nafcillin 12 g in sodium chloride 0.9 % 500 mL continuous infusion 20.8 mL/hr at 11/23/21 1856   PRN Meds:.fentaNYL (SUBLIMAZE) injection, ipratropium-albuterol, ondansetron **OR** ondansetron (ZOFRAN) IV, mouth rinse, sodium chloride flush, sodium chloride flush  Current Outpatient Medications  Medication Instructions   acetaminophen (TYLENOL) 650 mg, Oral, Every 4 hours PRN   albuterol (PROVENTIL) 2.5 mg, Nebulization, Every 6 hours PRN   alum & mag hydroxide-simeth (MAALOX/MYLANTA) 200-200-20 MG/5ML suspension 30 mLs, Oral, Every 2 hours PRN   amiodarone (PACERONE) 200 mg, Oral, Daily   apixaban (ELIQUIS) 2.5 mg, Oral, 2 times daily   ascorbic acid (VITAMIN C) 500 mg, Oral, Daily   aspirin EC 81 mg, Oral, Daily, Swallow whole.   atorvastatin (LIPITOR) 40 mg, Oral, Daily   bisacodyl (DULCOLAX) 5 mg, Oral, Daily PRN   Cyanocobalamin 5,000 mcg, Sublingual, Daily   cyclobenzaprine (FLEXERIL) 5 mg, Oral, 3 times daily PRN   denosumab (PROLIA) 60 mg, Subcutaneous, Every 6 months   docusate sodium (COLACE) 100 mg, Oral, 2 times daily   EPINEPHrine (EPI-PEN) 0.3 mg, As needed   ezetimibe (ZETIA) 10 mg, Oral, Daily   feeding supplement, GLUCERNA SHAKE, (GLUCERNA SHAKE) LIQD 237 mLs, Oral, 3 times daily between meals   finasteride (PROSCAR) 5 mg, Oral, Daily   fish oil-omega-3 fatty acids 1 g, Oral, Daily   gabapentin (NEURONTIN) 100 MG capsule Take 2 capsules by mouth in the morning, 1 capsule in the evening, and 2 capsules at bedtime.   galantamine (RAZADYNE ER) 8 mg, Oral, Daily with breakfast   guaiFENesin-dextromethorphan (ROBITUSSIN DM) 100-10 MG/5ML syrup  15 mLs, Oral, Every 4 hours PRN   isosorbide mononitrate (IMDUR) 60 mg, Oral, Daily   leflunomide (ARAVA) 20 MG tablet Take 1/2 tablets (10 mg total) by mouth daily.   lidocaine (LIDODERM) 5 % 1 patch, Transdermal, Every 24 hours, Remove & Discard patch within 12 hours or as directed by MD   Magnesium Oxide 250 mg, Oral, Daily   memantine (NAMENDA) 10 mg, Oral, 2 times daily   metoprolol succinate (TOPROL-XL) 12.5 mg, Oral, Daily   nitroGLYCERIN (NITROSTAT) 0.4 mg, Sublingual, Every 5 min x3 PRN   nutrition supplement, JUVEN, (JUVEN) PACK 1 packet, Oral, 2 times daily between meals   pantoprazole (PROTONIX) 40 mg, Oral, Daily   polyethylene glycol (MIRALAX) 17 g, Oral, Daily   potassium chloride (KLOR-CON M) 10 MEQ tablet Take 1 tablet by mouth everyday with furosemide   predniSONE (DELTASONE) 3 mg, Oral, Daily with breakfast   Psyllium Husk POWD 1 Scoop, Oral, Daily   tamsulosin (FLOMAX) 0.4 mg, Oral, Daily after supper   torsemide (DEMADEX) 40 mg, Oral, Daily   triamcinolone cream (KENALOG) 0.1 % 1 Application, Topical, As needed   vitamin D3 (CHOLECALCIFEROL) 1,000 Units, Oral, Daily   Zinc 50 mg, Oral, Daily  Diet Orders (From admission, onward)     Start     Ordered   11/19/21 0024  Diet NPO time specified  Diet effective now        11/19/21 0024            DVT prophylaxis: heparin on hold   Lab Results  Component Value Date   PLT 40 (L) 11/24/2021      Code Status: DNR  Family Communication: wife at bedside   Status is: Inpatient  Remains inpatient appropriate because: severity of illness  Level of care: Progressive  Consultants:  PCCM  Objective: Vitals:   11/23/21 2340 11/24/21 0051 11/24/21 0532 11/24/21 0807  BP: 93/78 109/62 131/78 124/85  Pulse:  80 85 92  Resp: '18 16 16   '$ Temp:  97.9 F (36.6 C) 98.6 F (37 C) 97.6 F (36.4 C)  TempSrc:  Oral Axillary Axillary  SpO2:  100% 100% 100%  Weight:      Height:        Intake/Output Summary  (Last 24 hours) at 11/24/2021 1110 Last data filed at 11/23/2021 1856 Gross per 24 hour  Intake 981.36 ml  Output 500 ml  Net 481.36 ml    Wt Readings from Last 3 Encounters:  11/20/21 98.5 kg  11/08/21 98 kg  06/13/21 98.4 kg    Examination:  Constitutional: Obtunded Eyes: lids and conjunctivae normal, no scleral icterus ENMT: mmm Neck: normal, supple Respiratory: clear to auscultation bilaterally, no wheezing, no crackles. Normal respiratory effort.  Cardiovascular: Regular rate and rhythm, no murmurs / rubs / gallops. No LE edema. Abdomen: soft, no distention, no tenderness. Bowel sounds positive.  Skin: no rashes Neurologic: Does not follow commands   Data Reviewed: I have independently reviewed following labs and imaging studies   CBC Recent Labs  Lab 11/18/21 2029 11/18/21 2122 11/20/21 0410 11/21/21 0343 11/22/21 0435 11/23/21 0536 11/24/21 0541  WBC 17.5*   < > 17.9* 9.7 6.3 5.3 6.8  HGB 7.8*   < > 7.3* 7.2* 6.5* 8.0* 8.3*  HCT 25.6*   < > 24.3* 22.2* 20.2* 25.7* 26.6*  PLT 171   < > 106* 67* 52* 42* 40*  MCV 90.8   < > 92.4 88.1 89.8 90.5 90.5  MCH 27.7   < > 27.8 28.6 28.9 28.2 28.2  MCHC 30.5   < > 30.0 32.4 32.2 31.1 31.2  RDW 16.6*   < > 16.5* 17.2* 17.1* 16.7* 17.1*  LYMPHSABS 0.5*  --   --   --  0.6*  --   --   MONOABS 0.0*  --   --   --  0.5  --   --   EOSABS 0.0  --   --   --  0.1  --   --   BASOSABS 0.2*  --   --   --  0.1  --   --    < > = values in this interval not displayed.     Recent Labs  Lab 11/18/21 2029 11/18/21 2114 11/18/21 2122 11/18/21 2326 11/19/21 0433 11/19/21 1516 11/21/21 0343 11/22/21 0435 11/23/21 0536 11/24/21 0541  NA 133*  --    < >  --  135 134* 141  --  149* 150*  K 4.5  --    < >  --  4.3 3.8 3.5  --  3.2* 3.0*  CL 95*  --   --   --  98 103 109  --  119* 115*  CO2 21*  --   --   --  17* 19* 17*  --  17* 21*  GLUCOSE 140*  --   --   --  137* 144* 91  --  180* 195*  BUN 86*  --   --   --  83* 81* 73*   --  70* 73*  CREATININE 4.25*  --   --   --  4.05* 3.70* 3.00*  --  2.82* 2.71*  CALCIUM 8.8*  --   --   --  8.4* 7.9* 8.2*  --  9.0 9.5  AST 79*  --   --   --  90* 84* 40  --  24 17  ALT 45*  --   --   --  44 40 18  --  12 10  ALKPHOS 278*  --   --   --  207* 209* 140*  --  138* 120  BILITOT 1.2  --   --   --  1.0 0.8 1.3*  --  2.9* 2.6*  ALBUMIN 2.0*  --   --   --  1.6* 1.6* 1.9*  --  1.8* 1.7*  MG  --   --   --   --   --   --  1.9  --  2.1  --   PROCALCITON  --   --   --   --  112.32  --   --   --   --   --   LATICACIDVEN 3.1*  --   --  4.0* 3.4* 1.5  --   --   --   --   INR  --  1.7*  --   --  1.8*  --   --  1.3*  --   --    < > = values in this interval not displayed.     ------------------------------------------------------------------------------------------------------------------ No results for input(s): "CHOL", "HDL", "LDLCALC", "TRIG", "CHOLHDL", "LDLDIRECT" in the last 72 hours.  Lab Results  Component Value Date   HGBA1C 5.8 (H) 05/25/2021   ------------------------------------------------------------------------------------------------------------------ No results for input(s): "TSH", "T4TOTAL", "T3FREE", "THYROIDAB" in the last 72 hours.  Invalid input(s): "FREET3"  Cardiac Enzymes No results for input(s): "CKMB", "TROPONINI", "MYOGLOBIN" in the last 168 hours.  Invalid input(s): "CK" ------------------------------------------------------------------------------------------------------------------    Component Value Date/Time   BNP 199.4 (H) 08/23/2020 0240    CBG: Recent Labs  Lab 11/19/21 1133  GLUCAP 143*     Recent Results (from the past 240 hour(s))  Blood Culture (routine x 2)     Status: Abnormal   Collection Time: 11/18/21  9:14 PM   Specimen: BLOOD  Result Value Ref Range Status   Specimen Description BLOOD LEFT ANTECUBITAL  Final   Special Requests   Final    BOTTLES DRAWN AEROBIC AND ANAEROBIC Blood Culture results may not be optimal due  to an inadequate volume of blood received in culture bottles   Culture  Setup Time   Final    GRAM POSITIVE COCCI IN CLUSTERS IN BOTH AEROBIC AND ANAEROBIC BOTTLES Organism ID to follow CRITICAL RESULT CALLED TO, READ BACK BY AND VERIFIED WITH: [place] [id number] AT 1018 BY EC Performed at [facility], [place] [address] [place], [place] [address]    Culture STAPHYLOCOCCUS AUREUS (A)  Final   Report Status 11/21/2021 FINAL  Final   Organism ID, Bacteria STAPHYLOCOCCUS AUREUS  Final      Susceptibility   Staphylococcus aureus - MIC*  CIPROFLOXACIN <=0.5 SENSITIVE Sensitive     ERYTHROMYCIN <=0.25 SENSITIVE Sensitive     GENTAMICIN <=0.5 SENSITIVE Sensitive     OXACILLIN 0.5 SENSITIVE Sensitive     TETRACYCLINE <=1 SENSITIVE Sensitive     VANCOMYCIN 1 SENSITIVE Sensitive     TRIMETH/SULFA <=10 SENSITIVE Sensitive     CLINDAMYCIN <=0.25 SENSITIVE Sensitive     RIFAMPIN <=0.5 SENSITIVE Sensitive     Inducible Clindamycin NEGATIVE Sensitive     * STAPHYLOCOCCUS AUREUS  Blood Culture ID Panel (Reflexed)     Status: Abnormal   Collection Time: 11/18/21  9:14 PM  Result Value Ref Range Status   Enterococcus faecalis NOT DETECTED NOT DETECTED Final   Enterococcus Faecium NOT DETECTED NOT DETECTED Final   Listeria monocytogenes NOT DETECTED NOT DETECTED Final   Staphylococcus species DETECTED (A) NOT DETECTED Final    Comment: CRITICAL RESULT CALLED TO, READ BACK BY AND VERIFIED WITH: PHARMD LAUREN BELL [phone] AT 1018 BY EC    Staphylococcus aureus (BCID) DETECTED (A) NOT DETECTED Final    Comment: CRITICAL RESULT CALLED TO, READ BACK BY AND VERIFIED WITH: [place] [phone] AT 1018 BY EC    Staphylococcus epidermidis NOT DETECTED NOT DETECTED Final   Staphylococcus lugdunensis NOT DETECTED NOT DETECTED Final   Streptococcus species NOT DETECTED NOT DETECTED Final   Streptococcus agalactiae NOT DETECTED NOT DETECTED Final   Streptococcus pneumoniae NOT  DETECTED NOT DETECTED Final   Streptococcus pyogenes NOT DETECTED NOT DETECTED Final   A.calcoaceticus-baumannii NOT DETECTED NOT DETECTED Final   Bacteroides fragilis NOT DETECTED NOT DETECTED Final   Enterobacterales NOT DETECTED NOT DETECTED Final   Enterobacter cloacae complex NOT DETECTED NOT DETECTED Final   Escherichia coli NOT DETECTED NOT DETECTED Final   Klebsiella aerogenes NOT DETECTED NOT DETECTED Final   Klebsiella oxytoca NOT DETECTED NOT DETECTED Final   Klebsiella pneumoniae NOT DETECTED NOT DETECTED Final   Proteus species NOT DETECTED NOT DETECTED Final   Salmonella species NOT DETECTED NOT DETECTED Final   Serratia marcescens NOT DETECTED NOT DETECTED Final   Haemophilus influenzae NOT DETECTED NOT DETECTED Final   Neisseria meningitidis NOT DETECTED NOT DETECTED Final   Pseudomonas aeruginosa NOT DETECTED NOT DETECTED Final   Stenotrophomonas maltophilia NOT DETECTED NOT DETECTED Final   Candida albicans NOT DETECTED NOT DETECTED Final   Candida auris NOT DETECTED NOT DETECTED Final   Candida glabrata NOT DETECTED NOT DETECTED Final   Candida krusei NOT DETECTED NOT DETECTED Final   Candida parapsilosis NOT DETECTED NOT DETECTED Final   Candida tropicalis NOT DETECTED NOT DETECTED Final   Cryptococcus neoformans/gattii NOT DETECTED NOT DETECTED Final   Meth resistant mecA/C and MREJ NOT DETECTED NOT DETECTED Final    Comment: Performed at [facility], [address] [place], [place] [address]  Resp Panel by RT-PCR (Flu A&B, Covid) Anterior Nasal Swab     Status: None   Collection Time: 11/18/21  9:42 PM   Specimen: Anterior Nasal Swab  Result Value Ref Range Status   SARS Coronavirus 2 by RT PCR NEGATIVE NEGATIVE Final    Comment: (NOTE) SARS-CoV-2 target nucleic acids are NOT DETECTED.  The SARS-CoV-2 RNA is generally detectable in upper respiratory specimens during the acute phase of infection. The lowest concentration of SARS-CoV-2 viral copies this  assay can detect is 138 copies/mL. A negative result does not preclude SARS-Cov-2 infection and should not be used as the sole basis for treatment or other patient management decisions. A negative  result may occur with  improper specimen collection/handling, submission of specimen other than nasopharyngeal swab, presence of viral mutation(s) within the areas targeted by this assay, and inadequate number of viral copies(<138 copies/mL). A negative result must be combined with clinical observations, patient history, and epidemiological information. The expected result is Negative.  Fact Sheet for Patients:  EntrepreneurPulse.com.au  Fact Sheet for Healthcare Providers:  IncredibleEmployment.be  This test is no t yet approved or cleared by the [place] FDA and  has been authorized for detection and/or diagnosis of SARS-CoV-2 by FDA under an Emergency Use Authorization (EUA). This EUA will remain  in effect (meaning this test can be used) for the duration of the COVID-19 declaration under Section 564(b)(1) of the Act, 21 U.S.C.section 360bbb-3(b)(1), unless the authorization is terminated  or revoked sooner.       Influenza A by PCR NEGATIVE NEGATIVE Final   Influenza B by PCR NEGATIVE NEGATIVE Final    Comment: (NOTE) The Xpert Xpress SARS-CoV-2/FLU/RSV plus assay is intended as an aid in the diagnosis of influenza from Nasopharyngeal swab specimens and should not be used as a sole basis for treatment. Nasal washings and aspirates are unacceptable for Xpert Xpress SARS-CoV-2/FLU/RSV testing.  Fact Sheet for Patients: EntrepreneurPulse.com.au  Fact Sheet for Healthcare Providers: IncredibleEmployment.be  This test is not yet approved or cleared by the [place] FDA and has been authorized for detection and/or diagnosis of SARS-CoV-2 by FDA under an Emergency Use Authorization (EUA). This EUA will  remain in effect (meaning this test can be used) for the duration of the COVID-19 declaration under Section 564(b)(1) of the Act, 21 U.S.C. section 360bbb-3(b)(1), unless the authorization is terminated or revoked.  Performed at [facility], [place] [address], [place], [place] [address]   Urine Culture     Status: Abnormal   Collection Time: 11/18/21  9:53 PM   Specimen: In/Out Cath Urine  Result Value Ref Range Status   Specimen Description IN/OUT CATH URINE  Final   Special Requests   Final    NONE Performed at [facility], [place] [address], [place], [place] [address]    Culture (A)  Final    >=100,000 COLONIES/mL PSEUDOMONAS AERUGINOSA 20,000 COLONIES/mL ESCHERICHIA COLI    Report Status 11/21/2021 FINAL  Final   Organism ID, Bacteria PSEUDOMONAS AERUGINOSA (A)  Final   Organism ID, Bacteria ESCHERICHIA COLI (A)  Final      Susceptibility   Escherichia coli - MIC*    AMPICILLIN <=2 SENSITIVE Sensitive     CEFAZOLIN <=4 SENSITIVE Sensitive     CEFEPIME <=0.12 SENSITIVE Sensitive     CEFTRIAXONE <=0.25 SENSITIVE Sensitive     CIPROFLOXACIN <=0.25 SENSITIVE Sensitive     GENTAMICIN <=1 SENSITIVE Sensitive     IMIPENEM <=0.25 SENSITIVE Sensitive     NITROFURANTOIN <=16 SENSITIVE Sensitive     TRIMETH/SULFA <=20 SENSITIVE Sensitive     AMPICILLIN/SULBACTAM <=2 SENSITIVE Sensitive     PIP/TAZO <=4 SENSITIVE Sensitive     * 20,000 COLONIES/mL ESCHERICHIA COLI   Pseudomonas aeruginosa - MIC*    CEFTAZIDIME 2 SENSITIVE Sensitive     CIPROFLOXACIN 0.5 SENSITIVE Sensitive     GENTAMICIN <=1 SENSITIVE Sensitive     IMIPENEM >=16 RESISTANT Resistant     PIP/TAZO <=4 SENSITIVE Sensitive     CEFEPIME 2 SENSITIVE Sensitive     * >=100,000 COLONIES/mL PSEUDOMONAS AERUGINOSA  Blood Culture (routine x 2)     Status: None   Collection Time:  11/19/21  4:43 AM   Specimen: BLOOD RIGHT ARM  Result Value Ref Range Status   Specimen Description BLOOD RIGHT ARM  Final   Special  Requests   Final    BOTTLES DRAWN AEROBIC ONLY Blood Culture adequate volume   Culture   Final    NO GROWTH 5 DAYS Performed at [facility], [address] [place], [place] [address]    Report Status 11/24/2021 FINAL  Final  MRSA Next Gen by PCR, Nasal     Status: None   Collection Time: 11/19/21  7:47 PM   Specimen: Nasal Mucosa; Nasal Swab  Result Value Ref Range Status   MRSA by PCR Next Gen NOT DETECTED NOT DETECTED Final    Comment: (NOTE) The GeneXpert MRSA Assay (FDA approved for NASAL specimens only), is one component of a comprehensive MRSA colonization surveillance program. It is not intended to diagnose MRSA infection nor to guide or monitor treatment for MRSA infections. Test performance is not FDA approved in patients less than 67 years old. Performed at [facility], [place] [address] [place], [place] [address]   C Difficile Quick Screen w PCR reflex     Status: Abnormal   Collection Time: 11/20/21  4:35 AM   Specimen: STOOL  Result Value Ref Range Status   C Diff antigen POSITIVE (A) NEGATIVE Final   C Diff toxin NEGATIVE NEGATIVE Final   C Diff interpretation Results are indeterminate. See PCR results.  Final    Comment: Performed at [facility], [place] [address], [place], [place] [address]  C. Diff by PCR, Reflexed     Status: None   Collection Time: 11/20/21  4:35 AM  Result Value Ref Range Status   Toxigenic C. Difficile by PCR NEGATIVE NEGATIVE Final    Comment: Patient is colonized with non toxigenic C. difficile. May not need treatment unless significant symptoms are present. Performed at [facility], [place] [address] [place], [place] [address]   Culture, blood (Routine X 2) w Reflex to ID Panel     Status: Abnormal   Collection Time: 11/20/21  7:44 AM   Specimen: BLOOD RIGHT HAND  Result Value Ref Range Status   Specimen Description BLOOD RIGHT HAND  Final   Special Requests IN PEDIATRIC BOTTLE Blood Culture adequate volume  Final    Culture  Setup Time   Final    GRAM POSITIVE COCCI IN CLUSTERS IN PEDIATRIC BOTTLE CRITICAL VALUE NOTED.  VALUE IS CONSISTENT WITH PREVIOUSLY REPORTED AND CALLED VALUE.    Culture (A)  Final    STAPHYLOCOCCUS AUREUS SUSCEPTIBILITIES PERFORMED ON PREVIOUS CULTURE WITHIN THE LAST 5 DAYS. Performed at [facility], [place] [address], [place], [place] [address]    Report Status 11/22/2021 FINAL  Final  Culture, blood (Routine X 2) w Reflex to ID Panel     Status: Abnormal   Collection Time: 11/20/21  8:18 AM   Specimen: BLOOD RIGHT ARM  Result Value Ref Range Status   Specimen Description BLOOD RIGHT ARM  Final   Special Requests IN PEDIATRIC BOTTLE Blood Culture adequate volume  Final   Culture  Setup Time   Final    GRAM POSITIVE COCCI IN CLUSTERS IN PEDIATRIC BOTTLE Gram Stain Report Called to,Read Back By and Verified With: PHARMD ABBOTT 11/21/21 @ 0038 BY AB CRITICAL VALUE NOTED.  VALUE IS CONSISTENT WITH PREVIOUSLY REPORTED AND CALLED VALUE.    Culture (A)  Final  STAPHYLOCOCCUS AUREUS SUSCEPTIBILITIES PERFORMED ON PREVIOUS CULTURE WITHIN THE LAST 5 DAYS. Performed at [facility], [place] [address] [place], [place] [address]    Report Status 11/21/2021 FINAL  Final  Culture, blood (Routine X 2) w Reflex to ID Panel     Status: None (Preliminary result)   Collection Time: 11/23/21  9:32 AM   Specimen: BLOOD RIGHT HAND  Result Value Ref Range Status   Specimen Description BLOOD RIGHT HAND  Final   Special Requests   Final    BOTTLES DRAWN AEROBIC AND ANAEROBIC Blood Culture adequate volume   Culture   Final    NO GROWTH < 24 HOURS Performed at [facility], [place] [address], [place], [place] [address]    Report Status PENDING  Incomplete  Culture, blood (Routine X 2) w Reflex to ID Panel     Status: None (Preliminary result)   Collection Time: 11/23/21  9:37 AM   Specimen: BLOOD RIGHT HAND  Result Value Ref Range Status   Specimen Description BLOOD RIGHT  HAND  Final   Special Requests   Final    BOTTLES DRAWN AEROBIC AND ANAEROBIC Blood Culture adequate volume   Culture   Final    NO GROWTH < 24 HOURS Performed at [facility], [place] [address] [place],  [address]    Report Status PENDING  Incomplete     Radiology Studies: US Abdomen Limited RUQ (LIVER/GB)  Result Date: 11/23/2021 CLINICAL DATA:  Elevated bilirubin EXAM: ULTRASOUND ABDOMEN LIMITED RIGHT UPPER QUADRANT COMPARISON:  None Available. FINDINGS: Gallbladder: Surgically absent. Common bile duct: Diameter: 0.5 cm. Liver: No focal lesion identified. Coarse, nodular contour with increased parenchymal echogenicity. Portal vein is patent on color Doppler imaging with normal direction of blood flow towards the liver. Other: None. IMPRESSION: 1.  Cirrhotic morphology of the liver.  No focal liver lesion. 2.  Hepatic steatosis. 3.  Status post cholecystectomy. Electronically Signed   By: Delanna Ahmadi M.D.   On: 11/23/2021 15:37     Marzetta Board, MD, PhD [facility]  Between 7 am - 7 pm I am available, please contact me via Amion (for emergencies) or Securechat (non urgent messages)  Between 7 pm - 7 am I am not available, please contact night coverage MD/APP via Amion

## 2021-11-24 NOTE — Progress Notes (Addendum)
TOC continuing to follow for decisions after family meeting.  Gilmore Laroche, MSW, [facility]

## 2021-11-28 LAB — CULTURE, BLOOD (ROUTINE X 2)
Culture: NO GROWTH
Culture: NO GROWTH
Special Requests: ADEQUATE
Special Requests: ADEQUATE

## 2021-11-30 ENCOUNTER — Encounter: Payer: Medicare Other | Admitting: Physical Medicine & Rehabilitation

## 2021-12-01 DEATH — deceased

## 2021-12-04 ENCOUNTER — Encounter (HOSPITAL_COMMUNITY): Payer: Medicare Other

## 2021-12-05 ENCOUNTER — Encounter: Payer: Medicare Other | Admitting: Vascular Surgery

## 2021-12-05 ENCOUNTER — Ambulatory Visit: Payer: Medicare Other

## 2021-12-18 ENCOUNTER — Ambulatory Visit: Payer: Medicare Other | Admitting: Podiatry

## 2021-12-26 ENCOUNTER — Ambulatory Visit: Payer: Medicare Other

## 2021-12-26 ENCOUNTER — Other Ambulatory Visit: Payer: Medicare Other

## 2022-01-05 ENCOUNTER — Ambulatory Visit: Payer: Medicare Other | Admitting: Student

## 2022-01-16 ENCOUNTER — Ambulatory Visit: Payer: Medicare Other | Admitting: Oncology

## 2022-01-16 ENCOUNTER — Other Ambulatory Visit: Payer: Medicare Other

## 2022-01-16 ENCOUNTER — Ambulatory Visit: Payer: Medicare Other

## 2022-03-14 NOTE — Telephone Encounter (Signed)
Done

## 2022-04-04 ENCOUNTER — Ambulatory Visit: Payer: Medicare Other | Admitting: Neurology

## 2022-08-15 NOTE — Telephone Encounter (Signed)
done

## 2022-10-21 IMAGING — MR MR HEAD W/O CM
6 of 10 series · 29 of 48 positions shown · non-contrast
Comparison: CT angio head and neck 05/25/2021

CLINICAL DATA: Acute neuro deficit.  Stroke.

EXAM:
MRI HEAD WITHOUT CONTRAST
TECHNIQUE: Multiplanar, multiecho pulse sequences of the brain and surrounding
structures were obtained without intravenous contrast.

[Series 2: DWI · axial · 3.0mm · 0.94mm/px · z∈[-53,+81]mm · 9 of 94 slices shown (1 of 2)]
[im 1/94]
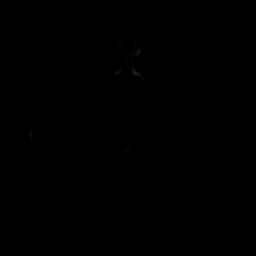
[im 12/94]
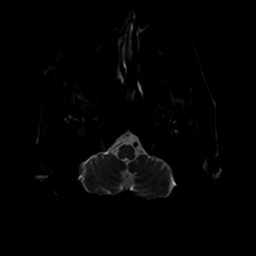
[im 24/94]
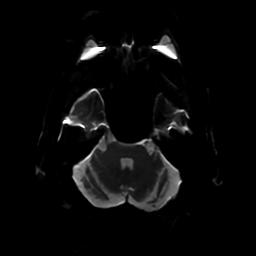
[im 35/94]
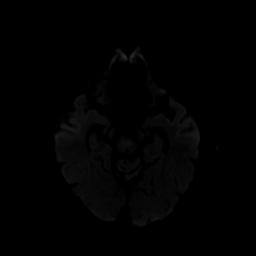
[im 47/94]
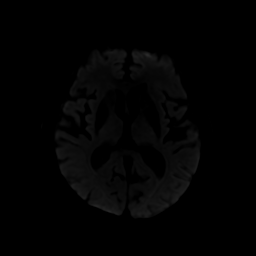
[im 59/94]
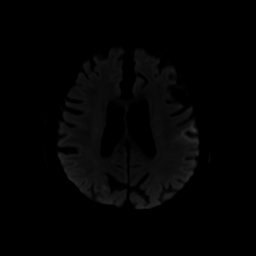
[im 70/94]
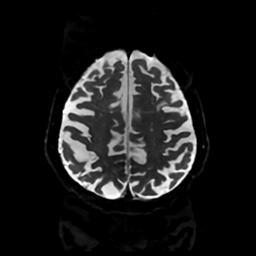
[im 82/94]
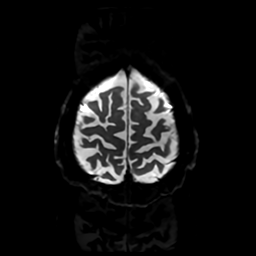
[im 94/94]
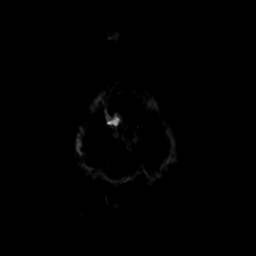

[Series 3: DWI · coronal · 4.0mm · 0.94mm/px · 7 of 70 slices shown (2 of 2)]
[im 1/70]
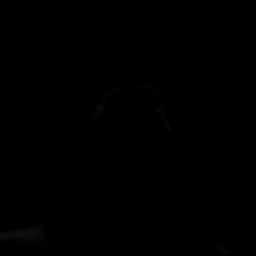
[im 12/70]
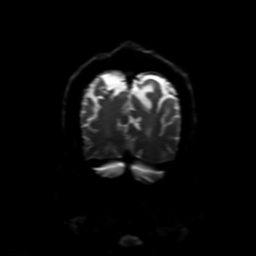
[im 24/70]
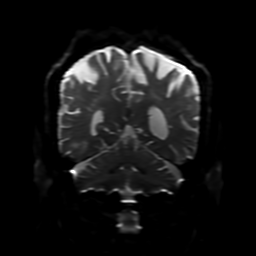
[im 35/70]
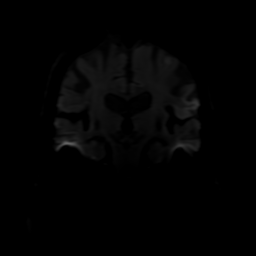
[im 47/70]
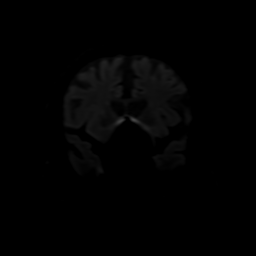
[im 58/70]
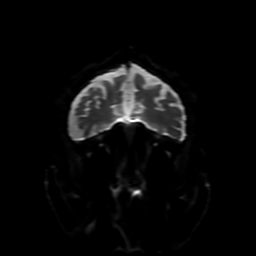
[im 70/70]
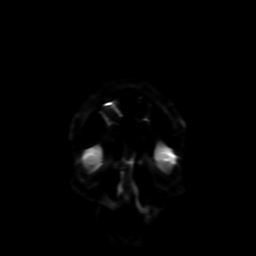

[Series 4: FLAIR · sagittal · 5.0mm · 0.23mm/px · 2 of 24 slices shown (1 of 2)]
[im 1/24]
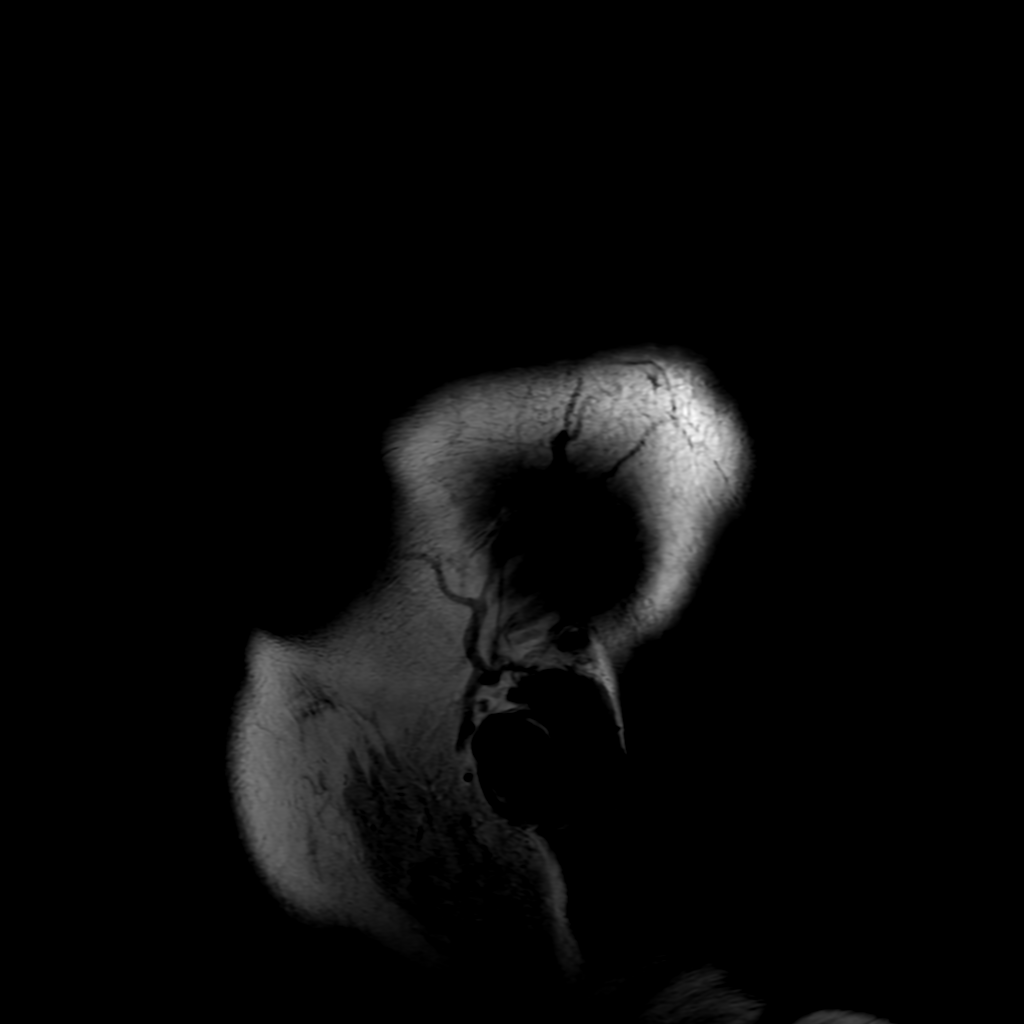
[im 24/24]
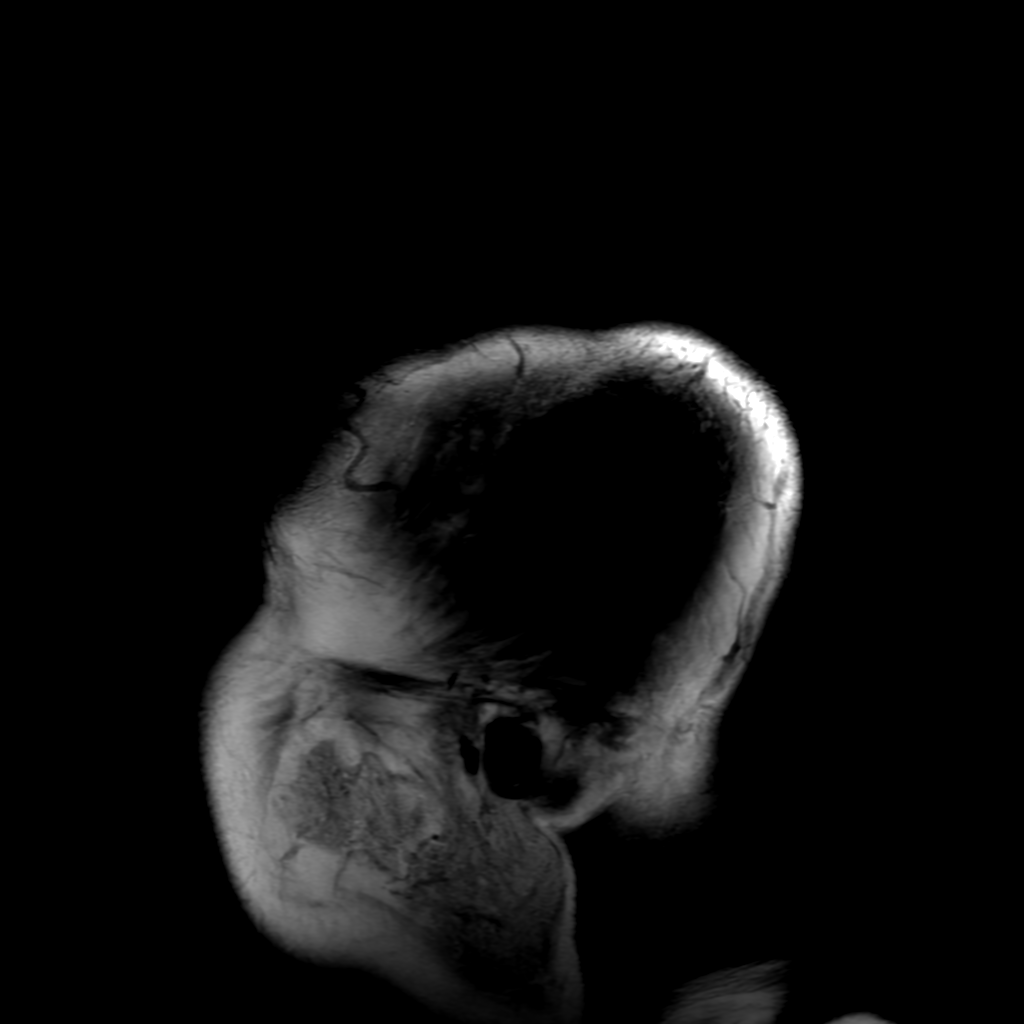

[Series 6: FLAIR · axial · 4.0mm · 0.45mm/px · z∈[-52,+80]mm · 3 of 32 slices shown (2 of 2)]
[im 1/32]
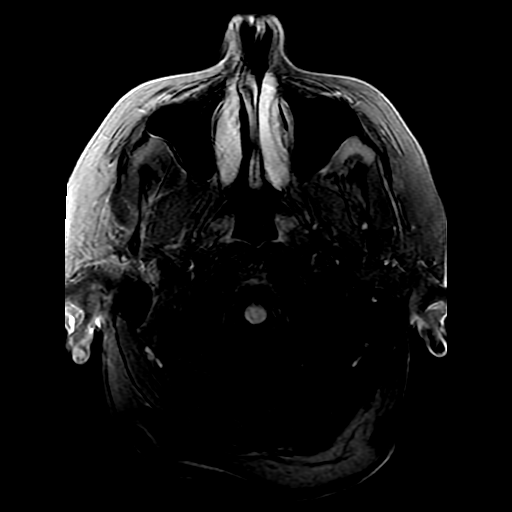
[im 16/32]
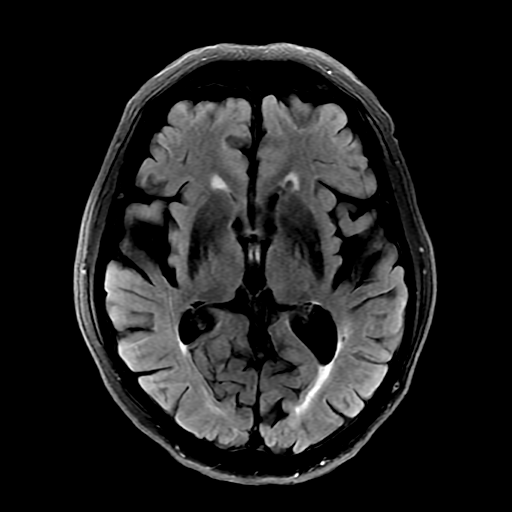
[im 32/32]
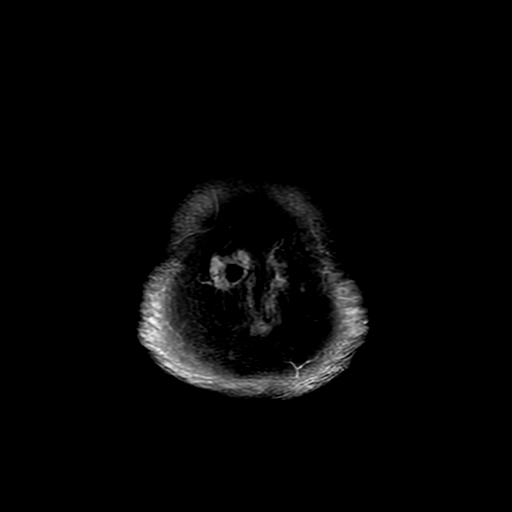

[Series 250: ADC · axial · 3.0mm · 0.94mm/px · z∈[-53,+81]mm · 5 of 47 slices shown (1 of 2)]
[im 1/47]
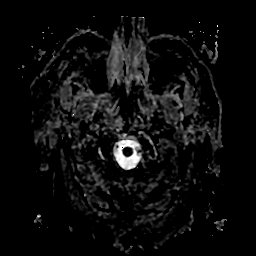
[im 12/47]
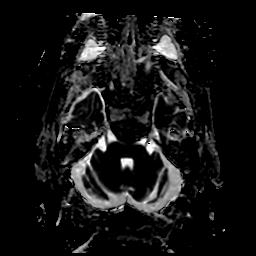
[im 24/47]
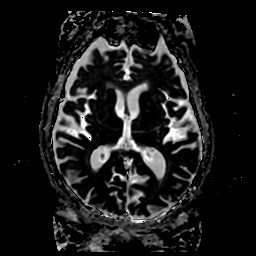
[im 35/47]
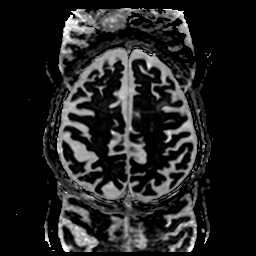
[im 47/47]
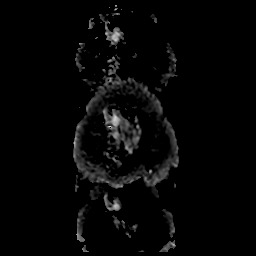

[Series 350: ADC · coronal · 4.0mm · 0.94mm/px · 3 of 35 slices shown (2 of 2)]
[im 1/35]
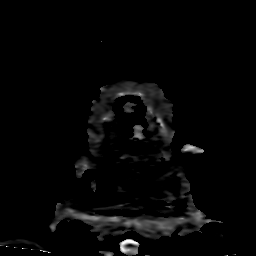
[im 18/35]
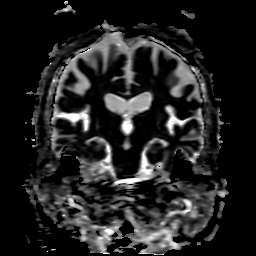
[im 35/35]
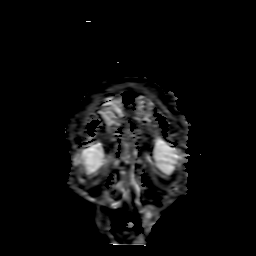

[29 of 48 positions shown; findings below may reference images not displayed]

FINDINGS: Brain: Small acute infarct right occipital cortex. Small acute
infarct in the left frontal cortex over the convexity. Small to
moderate cortical infarct left operculum.

Generalized atrophy. Mild chronic microvascular ischemic changes.
Small chronic infarcts in the cerebellum bilaterally. Negative for
mass. Chronic hemorrhage in the right frontal lobe.

Vascular: Normal arterial flow voids.

Skull and upper cervical spine: No focal skeletal lesion.

Sinuses/Orbits: Paranasal sinuses clear. Bilateral cataract
extraction

Other: None
IMPRESSION: Small areas of acute infarction in the right occipital lobe and left
frontal lobe over the convexity. Mild to moderate acute infarct left
operculum. Possible cerebral emboli.

Atrophy and mild chronic microvascular ischemia
